# Patient Record
Sex: Male | Born: 1950 | Race: White | Hispanic: No | Smoking: Never smoker
Health system: Southern US, Community
[De-identification: ages and names within clinical notes are randomized; demographics above are authoritative.]

## PROBLEM LIST (undated history)

## (undated) DIAGNOSIS — K5080 Crohn's disease of both small and large intestine without complications: Secondary | ICD-10-CM

## (undated) DIAGNOSIS — E669 Obesity, unspecified: Secondary | ICD-10-CM

## (undated) DIAGNOSIS — I480 Paroxysmal atrial fibrillation: Secondary | ICD-10-CM

## (undated) DIAGNOSIS — K219 Gastro-esophageal reflux disease without esophagitis: Secondary | ICD-10-CM

## (undated) DIAGNOSIS — I509 Heart failure, unspecified: Secondary | ICD-10-CM

## (undated) DIAGNOSIS — G4730 Sleep apnea, unspecified: Secondary | ICD-10-CM

## (undated) DIAGNOSIS — I4892 Unspecified atrial flutter: Secondary | ICD-10-CM

## (undated) DIAGNOSIS — I2510 Atherosclerotic heart disease of native coronary artery without angina pectoris: Secondary | ICD-10-CM

## (undated) DIAGNOSIS — I5022 Chronic systolic (congestive) heart failure: Secondary | ICD-10-CM

## (undated) DIAGNOSIS — I255 Ischemic cardiomyopathy: Secondary | ICD-10-CM

## (undated) DIAGNOSIS — Z86010 Personal history of colonic polyps: Secondary | ICD-10-CM

## (undated) DIAGNOSIS — E785 Hyperlipidemia, unspecified: Secondary | ICD-10-CM

## (undated) DIAGNOSIS — I10 Essential (primary) hypertension: Secondary | ICD-10-CM

## (undated) DIAGNOSIS — E119 Type 2 diabetes mellitus without complications: Secondary | ICD-10-CM

## (undated) DIAGNOSIS — R55 Syncope and collapse: Secondary | ICD-10-CM

## (undated) HISTORY — PX: HYDROCELE EXCISION: SHX482

## (undated) HISTORY — DX: Hyperlipidemia, unspecified: E78.5

## (undated) HISTORY — DX: Crohn's disease of both small and large intestine without complications: K50.80

## (undated) HISTORY — DX: Atherosclerotic heart disease of native coronary artery without angina pectoris: I25.10

## (undated) HISTORY — DX: Type 2 diabetes mellitus without complications: E11.9

## (undated) HISTORY — DX: Personal history of colonic polyps: Z86.010

## (undated) HISTORY — DX: Heart failure, unspecified: I50.9

## (undated) HISTORY — DX: Essential (primary) hypertension: I10

## (undated) HISTORY — DX: Paroxysmal atrial fibrillation: I48.0

## (undated) HISTORY — DX: Gastro-esophageal reflux disease without esophagitis: K21.9

## (undated) HISTORY — PX: CORONARY ANGIOPLASTY WITH STENT PLACEMENT: SHX49

## (undated) HISTORY — DX: Obesity, unspecified: E66.9

## (undated) HISTORY — PX: FOOT SURGERY: SHX648

---

## 1998-09-30 HISTORY — PX: OTHER SURGICAL HISTORY: SHX169

## 1998-10-12 ENCOUNTER — Encounter: Payer: Self-pay | Admitting: Gastroenterology

## 1998-10-12 ENCOUNTER — Other Ambulatory Visit: Admission: RE | Admit: 1998-10-12 | Discharge: 1998-10-12 | Payer: Self-pay | Admitting: Gastroenterology

## 1998-10-14 ENCOUNTER — Encounter: Payer: Self-pay | Admitting: Gastroenterology

## 1998-10-14 ENCOUNTER — Ambulatory Visit (HOSPITAL_COMMUNITY): Admission: RE | Admit: 1998-10-14 | Discharge: 1998-10-14 | Payer: Self-pay | Admitting: Gastroenterology

## 1998-10-21 ENCOUNTER — Encounter: Payer: Self-pay | Admitting: Gastroenterology

## 1998-10-21 ENCOUNTER — Ambulatory Visit (HOSPITAL_COMMUNITY): Admission: RE | Admit: 1998-10-21 | Discharge: 1998-10-21 | Payer: Self-pay | Admitting: Gastroenterology

## 1998-10-23 ENCOUNTER — Encounter: Payer: Self-pay | Admitting: General Surgery

## 1998-10-26 ENCOUNTER — Inpatient Hospital Stay (HOSPITAL_COMMUNITY): Admission: RE | Admit: 1998-10-26 | Discharge: 1998-10-31 | Payer: Self-pay | Admitting: General Surgery

## 2002-12-03 ENCOUNTER — Ambulatory Visit (HOSPITAL_COMMUNITY): Admission: RE | Admit: 2002-12-03 | Discharge: 2002-12-03 | Payer: Self-pay | Admitting: Gastroenterology

## 2002-12-03 ENCOUNTER — Encounter: Payer: Self-pay | Admitting: Gastroenterology

## 2003-11-30 DIAGNOSIS — Z86010 Personal history of colonic polyps: Secondary | ICD-10-CM

## 2003-11-30 DIAGNOSIS — Z860101 Personal history of adenomatous and serrated colon polyps: Secondary | ICD-10-CM

## 2003-11-30 HISTORY — DX: Personal history of colonic polyps: Z86.010

## 2003-11-30 HISTORY — DX: Personal history of adenomatous and serrated colon polyps: Z86.0101

## 2003-12-24 ENCOUNTER — Encounter: Payer: Self-pay | Admitting: Gastroenterology

## 2004-05-31 ENCOUNTER — Ambulatory Visit: Payer: Self-pay | Admitting: Urology

## 2004-06-17 ENCOUNTER — Ambulatory Visit: Payer: Self-pay | Admitting: Gastroenterology

## 2005-08-26 ENCOUNTER — Ambulatory Visit: Payer: Self-pay | Admitting: Gastroenterology

## 2005-12-06 ENCOUNTER — Encounter: Payer: Self-pay | Admitting: Cardiology

## 2005-12-30 ENCOUNTER — Encounter: Payer: Self-pay | Admitting: Cardiology

## 2006-01-29 ENCOUNTER — Encounter: Payer: Self-pay | Admitting: Cardiology

## 2006-03-01 ENCOUNTER — Encounter: Payer: Self-pay | Admitting: Cardiology

## 2006-05-19 ENCOUNTER — Inpatient Hospital Stay (HOSPITAL_COMMUNITY): Admission: AD | Admit: 2006-05-19 | Discharge: 2006-05-20 | Payer: Self-pay | Admitting: Cardiology

## 2006-05-19 ENCOUNTER — Emergency Department: Payer: Self-pay | Admitting: General Practice

## 2006-08-23 ENCOUNTER — Other Ambulatory Visit: Payer: Self-pay

## 2006-08-23 ENCOUNTER — Inpatient Hospital Stay: Payer: Self-pay | Admitting: Internal Medicine

## 2006-08-24 ENCOUNTER — Other Ambulatory Visit: Payer: Self-pay

## 2006-09-08 ENCOUNTER — Ambulatory Visit: Payer: Self-pay | Admitting: Gastroenterology

## 2006-09-09 ENCOUNTER — Inpatient Hospital Stay (HOSPITAL_COMMUNITY): Admission: EM | Admit: 2006-09-09 | Discharge: 2006-09-12 | Payer: Self-pay | Admitting: Emergency Medicine

## 2006-09-11 ENCOUNTER — Encounter: Payer: Self-pay | Admitting: Cardiovascular Disease

## 2006-10-25 ENCOUNTER — Inpatient Hospital Stay (HOSPITAL_COMMUNITY): Admission: EM | Admit: 2006-10-25 | Discharge: 2006-10-28 | Payer: Self-pay | Admitting: Emergency Medicine

## 2006-10-26 ENCOUNTER — Encounter: Payer: Self-pay | Admitting: Cardiovascular Disease

## 2006-12-05 ENCOUNTER — Ambulatory Visit: Payer: Self-pay

## 2006-12-19 ENCOUNTER — Ambulatory Visit: Payer: Self-pay

## 2007-09-12 ENCOUNTER — Ambulatory Visit: Payer: Self-pay | Admitting: Gastroenterology

## 2008-03-25 ENCOUNTER — Encounter: Payer: Self-pay | Admitting: Cardiovascular Disease

## 2008-09-04 ENCOUNTER — Encounter: Payer: Self-pay | Admitting: Cardiovascular Disease

## 2008-09-11 ENCOUNTER — Encounter: Payer: Self-pay | Admitting: Cardiovascular Disease

## 2008-10-24 ENCOUNTER — Encounter (INDEPENDENT_AMBULATORY_CARE_PROVIDER_SITE_OTHER): Payer: Self-pay | Admitting: *Deleted

## 2008-11-11 ENCOUNTER — Ambulatory Visit: Payer: Self-pay | Admitting: Gastroenterology

## 2008-11-11 DIAGNOSIS — Z860100 Personal history of colon polyps, unspecified: Secondary | ICD-10-CM | POA: Insufficient documentation

## 2008-11-11 DIAGNOSIS — I4891 Unspecified atrial fibrillation: Secondary | ICD-10-CM

## 2008-11-11 DIAGNOSIS — Z91013 Allergy to seafood: Secondary | ICD-10-CM

## 2008-11-11 DIAGNOSIS — Z9861 Coronary angioplasty status: Secondary | ICD-10-CM

## 2008-11-11 DIAGNOSIS — I2510 Atherosclerotic heart disease of native coronary artery without angina pectoris: Secondary | ICD-10-CM

## 2008-11-11 DIAGNOSIS — K219 Gastro-esophageal reflux disease without esophagitis: Secondary | ICD-10-CM | POA: Insufficient documentation

## 2008-11-11 DIAGNOSIS — K5080 Crohn's disease of both small and large intestine without complications: Secondary | ICD-10-CM | POA: Insufficient documentation

## 2008-11-12 ENCOUNTER — Encounter: Payer: Self-pay | Admitting: Gastroenterology

## 2008-11-27 ENCOUNTER — Telehealth: Payer: Self-pay | Admitting: Gastroenterology

## 2008-12-16 ENCOUNTER — Ambulatory Visit: Payer: Self-pay | Admitting: Gastroenterology

## 2008-12-16 ENCOUNTER — Encounter: Payer: Self-pay | Admitting: Gastroenterology

## 2008-12-17 ENCOUNTER — Encounter: Payer: Self-pay | Admitting: Gastroenterology

## 2008-12-25 ENCOUNTER — Ambulatory Visit: Payer: Self-pay | Admitting: Urology

## 2009-01-05 ENCOUNTER — Ambulatory Visit: Payer: Self-pay | Admitting: Urology

## 2009-01-12 ENCOUNTER — Emergency Department: Payer: Self-pay | Admitting: Unknown Physician Specialty

## 2010-02-02 ENCOUNTER — Telehealth: Payer: Self-pay | Admitting: Gastroenterology

## 2010-02-05 ENCOUNTER — Encounter: Payer: Self-pay | Admitting: Cardiovascular Disease

## 2010-03-25 ENCOUNTER — Ambulatory Visit: Payer: Self-pay | Admitting: Gastroenterology

## 2010-04-02 ENCOUNTER — Encounter: Payer: Self-pay | Admitting: Cardiovascular Disease

## 2010-05-06 ENCOUNTER — Ambulatory Visit: Payer: Self-pay | Admitting: Cardiovascular Disease

## 2010-05-06 ENCOUNTER — Encounter: Payer: Self-pay | Admitting: Cardiovascular Disease

## 2010-05-06 DIAGNOSIS — E785 Hyperlipidemia, unspecified: Secondary | ICD-10-CM

## 2010-05-06 DIAGNOSIS — R609 Edema, unspecified: Secondary | ICD-10-CM

## 2010-05-06 LAB — CONVERTED CEMR LAB: POC INR: 2.3

## 2010-05-12 ENCOUNTER — Ambulatory Visit: Payer: Self-pay | Admitting: Cardiovascular Disease

## 2010-05-12 LAB — CONVERTED CEMR LAB: POC INR: 2.6

## 2010-05-21 ENCOUNTER — Encounter: Payer: Self-pay | Admitting: Cardiovascular Disease

## 2010-06-02 ENCOUNTER — Ambulatory Visit: Payer: Self-pay | Admitting: Cardiovascular Disease

## 2010-06-16 ENCOUNTER — Ambulatory Visit: Payer: Self-pay | Admitting: Cardiovascular Disease

## 2010-07-07 ENCOUNTER — Ambulatory Visit: Payer: Self-pay | Admitting: Cardiovascular Disease

## 2010-07-14 LAB — CONVERTED CEMR LAB: POC INR: 2.3

## 2010-08-11 ENCOUNTER — Ambulatory Visit: Admission: RE | Admit: 2010-08-11 | Discharge: 2010-08-11 | Payer: Self-pay | Source: Home / Self Care

## 2010-08-11 LAB — CONVERTED CEMR LAB: POC INR: 2.8

## 2010-08-31 NOTE — Medication Information (Signed)
Summary: Colton Lamb  Anticoagulant Therapy  Managed by: Tula Nakayama, RN, BSN Referring MD: Dr Rockey Situ PCP: Aniceto Boss, MD Supervising MD: Esmond Plants Indication 1: Atrial Fibrillation Lab Used: [facility] [place] Site: [place] INR POC 2.3 INR RANGE 2.0-3.0  Dietary changes: no    Health status changes: no    Bleeding/hemorrhagic complications: no    Recent/future hospitalizations: no    Any changes in medication regimen? no    Recent/future dental: no  Any missed doses?: no       Is patient compliant with meds? yes       Allergies: 1)  ! Tetracycline 2)  ! Iodine 3)  * Shellfish  Anticoagulation Management History:      The patient is taking warfarin and comes in today for a routine follow up visit.  Negative risk factors for bleeding include an age less than 23 years old.  The bleeding index is 'low risk'.  Positive CHADS2 values include History of HTN.  Negative CHADS2 values include Age > 75 years old.  Anticoagulation responsible provider: Esmond Plants.  INR POC: 2.3.  Cuvette Lot#: [id number].  Exp: 07/2011.    Anticoagulation Management Assessment/Plan:      The patient's current anticoagulation dose is Warfarin sodium 1 mg tabs: 1/2 once daily, Warfarin sodium 4 mg tabs: take as directed.  The target INR is 2.0-3.0.  The next INR is due 07/07/2010.  Anticoagulation instructions were given to patient.  Results were reviewed/authorized by Tula Nakayama, RN, BSN.  He was notified by Tula Nakayama, RN, BSN.         Prior Anticoagulation Instructions: INR 4.0  Skip today's dosage then start taking 1.5 tablets daily except 1 tablet on Mondays, Wednesdays, and Fridays.  Recheck in 2 weeks.     Current Anticoagulation Instructions: INR 2.3 Continue 4.52m daily except 338m on Mondays, Wednesdays and Fridays. Recheck in 3 weeks.

## 2010-08-31 NOTE — Letter (Signed)
Summary: [place]   Imported By: Marilynne Drivers 06/04/2010 13:56:45  _____________________________________________________________________  External Attachment:    Type:   Image     Comment:   External Document

## 2010-08-31 NOTE — Assessment & Plan Note (Signed)
Summary: NP6/AMD  Medications Added DILTIAZEM HCL ER BEADS 120 MG XR24H-CAP (DILTIAZEM HCL ER BEADS) Take one capsule by mouth daily FUROSEMIDE 20 MG TABS (FUROSEMIDE) Take 1 tablet by mouth once a day WARFARIN SODIUM 1 MG TABS (WARFARIN SODIUM) 1/2 once daily WARFARIN SODIUM 4 MG TABS (WARFARIN SODIUM) take as directed ZETIA 10 MG TABS (EZETIMIBE) Take one tablet by mouth daily.      Allergies Added:   Visit Type:  Initial Consult Primary Provider:  Aniceto Boss, MD  CC:  Establish care.  Former [place] patient.  Denies chest pain or shortness of breath..  History of Present Illness: [name] is a 60 year old gentleman with a history of paroxysmal atrial fibrillation,  hyperlipidemia, coronary artery disease, PCI x3 to the LAD, circumflex, PDA and PL branch, ejection fraction 45-50% with anterior hypokinesis, last catheterization in 2008 with admission for unstable angina found to have in-stent restenosis in the RCA and had treatment with a cypher stent to the OM who presents to establish care.  He has had chronic lower extremity edema which is worse on the right than the left.he does have significant p.o. fluid intake. He has been on diltiazem for many years and is uncertain if his edema started when the diltiazem was started. He denies any chest pain, shortness of breath.  cardiac catheterization February 2008 showed patent LAD stent proximally, large codominant circumflex with patent stent, RCA with 40-50% stenosis in the distal right, 80% instant restenosis in the mid PDA in the setting of a prior cutting balloon atherectomy. A 2.5 x 13 mm Cypher stent was placed.  EKG shows normal sinus rhythm with rate 63 beats per minute, poor R-wave progression, unable to rule out anterior infarct, left axis deviation  Last stress test February 2010 showing moderate to severe perfusion defect in the mid anteroseptal, apical anterior, apical septal, apical and septal regions consistent with  infarct or scar. Also in the inferior wall. No significant ischemia. Ejection fraction 54%.   Labwork from September 2007 shows total cholesterol 180, HDL 39, LDL 108  Echocardiogram March 2008 shows ejection fraction 45-55%, mild to moderately dilated, mid to distal anterior septal wall with severe hypokinesis, mild LVH  Current Medications (verified): 1)  Plavix 75 Mg Tabs (Clopidogrel Bisulfate) .Marland Kitchen.. 1 Tablet By Mouth Once Daily 2)  Lisinopril 20 Mg Tabs (Lisinopril) .Marland Kitchen.. 1 By Mouth Once Daily 3)  Omeprazole 20 Mg Tbec (Omeprazole) .Marland Kitchen.. 1 Tablet By Mouth Once Daily 4)  Coreg Cr 80 Mg Xr24h-Cap (Carvedilol Phosphate) .Marland Kitchen.. 1 Tablet By Mouth Once Daily 5)  Isosorbide Mononitrate Cr 30 Mg Xr24h-Tab (Isosorbide Mononitrate) .... Take One Tablet By Mouth Daily 6)  Aspirin 81 Mg Tbec (Aspirin) .Marland Kitchen.. 1 Tablet By Mouth Once Daily 7)  Dilt-Xr 180 Mg Xr24h-Cap (Diltiazem Hcl) .Marland Kitchen.. 1 Tablet By Mouth Once Daily 8)  Lipitor 80 Mg Tabs (Atorvastatin Calcium) .Marland Kitchen.. 1 Tablet By Mouth Once Daily 9)  Niaspan 1000 Mg Cr-Tabs (Niacin (Antihyperlipidemic)) .Marland Kitchen.. 1 By Mouth At Bedtime 10)  Nitro-Dur 0.4 Mg/hr Pt24 (Nitroglycerin) .... As Needed 11)  Asacol Hd 800 Mg Tbec (Mesalamine) .... One Tablet By Mouth Three Times A Day 12)  Furosemide 20 Mg Tabs (Furosemide) .... 1/2 Tablet By Mouth Once Daily 13)  Warfarin Sodium 1 Mg Tabs (Warfarin Sodium) .... 1/2 Once Daily 14)  Warfarin Sodium 4 Mg Tabs (Warfarin Sodium) .... Take As Directed  Allergies (verified): 1)  ! Tetracycline 2)  ! 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  Alcohol Use - no Illicit Drug Use - no Patient does not get regular exercise.   Risk Factors: Exercise: no (11/11/2008)  Risk Factors: Smoking Status: never (11/11/2008)  Vital Signs:  Patient profile:   60 year old male Height:      71 inches Weight:      276 pounds BMI:     38.63 Pulse rate:   76 / minute BP sitting:   107 / 66  (left arm) Cuff size:   regular  Vitals Entered By: Dolores Lory, CMA (May 06, 2010 2:29 PM)  Physical Exam  General:  Well developed, well nourished, in no acute distress. Head:  normocephalic and atraumatic Neck:  Neck supple, no JVD. No masses, thyromegaly or abnormal cervical nodes. Lungs:  Clear bilaterally to auscultation and percussion. Heart:  Non-displaced PMI, chest non-tender; regular rate and rhythm, S1, S2 with murmur II/VI SEM RSB, no rubs or gallops. Carotid upstroke normal, no bruit. . Pedals normal pulses. 1+ b/l LE  edema, no varicosities. Abdomen:  Bowel sounds positive; abdomen soft and non-tender without masses Msk:  Back normal, normal gait. Muscle strength and tone normal. Pulses:  pulses normal in all 4 extremities Extremities:  No clubbing or cyanosis. Neurologic:  Alert and oriented x 3. Skin:  Intact without lesions or rashes. Psych:  Normal affect.   Impression & Recommendations:  Problem # 1:  CORONARY ARTERY DISEASE (ICD-414.00) appears stable at this time, stress test last year showing no ischemia, large region of scar. No further testing at this time  The following medications were removed from the medication list:    Warfarin Sodium 3 Mg Tabs (Warfarin sodium) .Marland Kitchen... 1 1/2 tablets by mouth once daily His updated medication list for  this problem includes:    Plavix 75 Mg Tabs (Clopidogrel bisulfate) .Marland Kitchen... 1 tablet by mouth once daily    Lisinopril 20 Mg Tabs (Lisinopril) .Marland Kitchen... 1 by mouth once daily    Coreg Cr 80 Mg Xr24h-cap (Carvedilol phosphate) .Marland Kitchen... 1 tablet by mouth once daily    Isosorbide Mononitrate Cr 30 Mg Xr24h-tab (Isosorbide mononitrate) .Marland Kitchen... Take one tablet by mouth daily    Aspirin 81 Mg Tbec (Aspirin) .Marland Kitchen... 1 tablet by mouth once daily    Diltiazem Hcl Er Beads 120 Mg Xr24h-cap (Diltiazem hcl er beads) .Marland Kitchen... Take one capsule by mouth daily    Nitro-dur 0.4 Mg/hr Pt24 (Nitroglycerin) .Marland Kitchen... As needed    Warfarin Sodium 1 Mg Tabs (Warfarin sodium) .Marland Kitchen... 1/2 once daily    Warfarin Sodium 4 Mg Tabs (Warfarin sodium) .Marland Kitchen... Take as directed  Problem # 2:  ATRIAL FIBRILLATION (ICD-427.31) Maintaining normal sinus rhythm. Continue carvedilol.  The following medications were removed from the medication list:    Warfarin Sodium 3 Mg Tabs (Warfarin sodium) .Marland Kitchen... 1 1/2 tablets by mouth once daily His updated medication list for this problem includes:    Plavix 75 Mg Tabs (Clopidogrel bisulfate) .Marland Kitchen... 1 tablet by mouth once daily    Coreg  Cr 80 Mg Xr24h-cap (Carvedilol phosphate) .Marland Kitchen... 1 tablet by mouth once daily    Aspirin 81 Mg Tbec (Aspirin) .Marland Kitchen... 1 tablet by mouth once daily    Warfarin Sodium 1 Mg Tabs (Warfarin sodium) .Marland Kitchen... 1/2 once daily    Warfarin Sodium 4 Mg Tabs (Warfarin sodium) .Marland Kitchen... Take as directed  Problem # 3:  EDEMA (ICD-782.3) For his edema, we will increase his Lasix to 20 mg daily, have encouraged him to decrease his fluid intake. We will also decrease his diltiazem from 180 mg to 120 mg as calcium channel blockers can be associated with venous insufficiency.  Problem # 4:  HYPERLIPIDEMIA-MIXED (RSW-546.4) We will add zetia 10 mg as his LDL is not at goal.  His updated medication list for this problem includes:    Lipitor 80 Mg Tabs (Atorvastatin calcium) .Marland Kitchen... 1 tablet by mouth once  daily    Niaspan 1000 Mg Cr-tabs (Niacin (antihyperlipidemic)) .Marland Kitchen... 1 by mouth at bedtime    Zetia 10 Mg Tabs (Ezetimibe) .Marland Kitchen... Take one tablet by mouth daily.  Patient Instructions: 1)  Your physician has recommended you make the following change in your medication: DECREASE diltiazem 143m. INCREASE lasix take 1 tab daily. START zetia  2)  Your physician wants you to follow-up in:  6 months  You will receive a reminder letter in the mail two months in advance. If you don't receive a letter, please call our office to schedule the follow-up appointment. 3)  Decrease fluid intake  Prescriptions: FUROSEMIDE 20 MG TABS (FUROSEMIDE) Take 1 tablet by mouth once a day  #30 x 6   Entered by:   ACordelia Pen RN   Authorized by:   TEsmond PlantsMD   Signed by:   ACordelia Pen RN on 05/06/2010   Method used:   Electronically to        [facility]  [address] [facility][phone] (retail)       [address]  [address]      Ph: [phone] [phone]      Fax: [phone]  RxID:   [id number] HCL ER BEADS 120 MG XR24H-CAP (DILTIAZEM HCL ER BEADS) Take one capsule by mouth daily  #30 x 6   Entered by:   ACordelia Pen RN   Authorized by:   TEsmond PlantsMD   Signed by:   ACordelia Pen RN on 05/06/2010   Method used:   [facility]        [facility]  [address] [facility][phone] (retail)       [address]      [place]  [address]      Ph: [phone] [phone]      Fax: [phone]  RxID::   [id number] 10 MG TABS (EZETIMIBE) Take one tablet by mouth daily.  #30 x 6   Entered by:   ACordelia Pen RN   Authorized by:   TEsmond PlantsMD   Signed by:   ACordelia Pen RN on 05/06/2010   Method used:   [facility]ically to        [facility][phone] (retail)       [address]      [place]  [address]  Ph: [phone] or [phone]       Fax: [phone]   RxID:   [id number]

## 2010-08-31 NOTE — Medication Information (Signed)
Summary: rov/ewj  Anticoagulant Therapy  Managed by: Freddrick March, RN, BSN Referring MD: Dr Rockey Situ PCP: Aniceto Boss, MD Supervising MD: Esmond Plants Indication 1: Atrial Fibrillation Lab Used: [facility]  Site: [place] INR POC 4.0 INR RANGE 2.0-3.0  Dietary changes: no    Health status changes: no    Bleeding/hemorrhagic complications: no    Recent/future hospitalizations: no    Any changes in medication regimen? no    Recent/future dental: no  Any missed doses?: no       Is patient compliant with meds? yes       Allergies: 1)  ! Tetracycline 2)  ! Iodine 3)  * Shellfish  Anticoagulation Management History:      The patient is taking warfarin and comes in today for a routine follow up visit.  Negative risk factors for bleeding include an age less than 59 years old.  The bleeding index is 'low risk'.  Positive CHADS2 values include History of HTN.  Negative CHADS2 values include Age > 63 years old.  Anticoagulation responsible Jguadalupe Opiela: Esmond Plants.  INR POC: 4.0.  Cuvette Lot#: [id number].  Exp: 05/2011.    Anticoagulation Management Assessment/Plan:      The patient's current anticoagulation dose is Warfarin sodium 1 mg tabs: 1/2 once daily, Warfarin sodium 4 mg tabs: take as directed.  The target INR is 2.0-3.0.  The next INR is due 06/16/2010.  Results were reviewed/authorized by Freddrick March, RN, BSN.  He was notified by Freddrick March RN.         Prior Anticoagulation Instructions: INR 2.6  Continue on same dosage 1.5 tablets daily except 1 tablet on Mondays and Fridays.  Recheck in 3 weeks.  Current Anticoagulation Instructions: INR 4.0  Skip today's dosage then start taking 1.5 tablets daily except 1 tablet on Mondays, Wednesdays, and Fridays.  Recheck in 2 weeks.

## 2010-08-31 NOTE — Assessment & Plan Note (Signed)
Summary: med refill--ch.   History of Present Illness Visit Type: follow up Primary GI MD: Joylene Igo MD [facility] Primary Zerek Litsey: Aniceto Boss, MD Chief Complaint: Crohn's f/u and medication refills. Pt is doing very well on his medicine dose.  History of Present Illness:   Colton Lamb returns for followup of Crohn's ileocolitis. He has no ongoing colorectal complaints. He last underwent colonoscopy in May 2010. His reflux symptoms remain under very good control on omeprazole 20 mg daily.   GI Review of Systems      Denies abdominal pain, acid reflux, belching, bloating, chest pain, dysphagia with liquids, dysphagia with solids, heartburn, loss of appetite, nausea, vomiting, vomiting blood, weight loss, and  weight gain.        Denies anal fissure, black tarry stools, change in bowel habit, constipation, diarrhea, diverticulosis, fecal incontinence, heme positive stool, hemorrhoids, irritable bowel syndrome, jaundice, light color stool, liver problems, rectal bleeding, and  rectal pain.   Current Medications (verified): 1)  Plavix 75 Mg Tabs (Clopidogrel Bisulfate) .Marland Kitchen.. 1 Tablet By Mouth Once Daily 2)  Lisinopril 20 Mg Tabs (Lisinopril) .Marland Kitchen.. 1 By Mouth Once Daily 3)  Omeprazole 20 Mg Tbec (Omeprazole) .Marland Kitchen.. 1 Tablet By Mouth Once Daily 4)  Coreg Cr 80 Mg Xr24h-Cap (Carvedilol Phosphate) .Marland Kitchen.. 1 Tablet By Mouth Once Daily 5)  Isosorbide Mononitrate Cr 30 Mg Xr24h-Tab (Isosorbide Mononitrate) .... Take One Tablet By Mouth Daily 6)  Aspirin 81 Mg Tbec (Aspirin) .Marland Kitchen.. 1 Tablet By Mouth Once Daily 7)  Dilt-Xr 180 Mg Xr24h-Cap (Diltiazem Hcl) .Marland Kitchen.. 1 Tablet By Mouth Once Daily 8)  Warfarin Sodium 3 Mg Tabs (Warfarin Sodium) .Marland Kitchen.. 1 1/2 Tablets By Mouth Once Daily 9)  Lipitor 80 Mg Tabs (Atorvastatin Calcium) .Marland Kitchen.. 1 Tablet By Mouth Once Daily 10)  Niaspan 1000 Mg Cr-Tabs (Niacin (Antihyperlipidemic)) .Marland Kitchen.. 1 By Mouth At Bedtime 11)  Nitro-Dur 0.4 Mg/hr Pt24 (Nitroglycerin) .... As Needed 12)   Asacol Hd 800 Mg Tbec (Mesalamine) .... One Tablet By Mouth Three Times A Day 13)  Furosemide 20 Mg Tabs (Furosemide) .... 1/2 Tablet By Mouth Once Daily  Allergies (verified): 1)  ! Tetracycline 2)  ! 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 Ileocecal resection and sigmoid enterocolonic fistula repair 09/1998  Drug-eluting artery stent 08/2006 Left testicle hydrocele removed Foot surgery  Family History: Reviewed history from 11/11/2008 and no changes required. No FH of Colon Cancer: Family History of Breast Cancer: mother Family History of Heart Disease: father  Social History: Reviewed history from 11/11/2008 and no changes required. Occupation: retired Patient has never smoked.  Alcohol Use - no Illicit Drug Use - no Patient does not get regular exercise.   Review of Systems  The patient denies allergy/sinus, anemia, anxiety-new, arthritis/joint pain, back pain, blood in urine, breast changes/lumps, change in vision, confusion, cough, coughing up blood, depression-new, fainting, fatigue, fever, headaches-new, hearing problems, heart murmur, heart rhythm changes, itching, menstrual pain, muscle pains/cramps, night sweats, nosebleeds, pregnancy symptoms, shortness of breath, skin rash, sleeping problems, sore throat, swelling of feet/legs, swollen lymph glands, thirst - excessive , urination - excessive , urination changes/pain, urine leakage, vision changes, and voice change.    Vital Signs:  Patient profile:   60 year old male Height:      71 inches  Weight:      283.8 pounds BMI:     39.73 Pulse rate:   70 / minute Pulse rhythm:   regular BP sitting:   116 / 74  (right arm) Cuff size:    regular  Vitals Entered By: Colton Lamb CMA Deborra Medina) (March 25, 2010 9:51 AM)  Physical Exam  General:  Well developed, well nourished, no acute distress. obese.   Head:  Normocephalic and atraumatic. Eyes:  PERRLA, no icterus. Mouth:  No deformity or lesions, dentition normal. Lungs:  Clear throughout to auscultation. Heart:  Regular rate and rhythm; no murmurs, rubs,  or bruits. Abdomen:  Soft, nontender and nondistended. No masses, hepatosplenomegaly or hernias noted. Normal bowel sounds. Psych:  Alert and cooperative. Normal mood and affect.  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  Problem # 2:  GERD (ICD-530.81) Continue standard antireflux measures and omeprazole 20 mg q. day.  Problem # 3:  COLONIC POLYPS, HX OF (ICD-V12.72) Prior history of adenomatous colon polyps. Surveillance colonoscopy recommended. May 2015.  Patient Instructions: 1)  Pick up your prescription from your pharmacy.  2)  Please continue current medications.  3)  Please schedule a follow-up appointment in 1 year. 4)  Copy sent to : Aniceto Boss, MD 5)  The medication list was reviewed and reconciled.  All changed / newly prescribed medications were explained.  A complete medication list was provided to the patient / caregiver.  Prescriptions: ASACOL HD 800 MG TBEC (MESALAMINE) one tablet by mouth three times a day  #90 x 11   Entered by:   Colton Lamb CMA ([place])   Authorized by:   Ladene Artist MD [facility]   Signed by:   Ladene Artist MD [facility] on 03/25/2010   Method used:   [facility]        [id number]       [address]       [place], [place]  [address]       Ph: [phone] or [phone]       Fax: [phone]   RxID:   [id number]

## 2010-08-31 NOTE — Medication Information (Signed)
Summary: [facility]   Imported By: Zenovia Jarred 05/21/2010 11:28:02  _____________________________________________________________________  External Attachment:    Type:   Image     Comment:   External Document

## 2010-08-31 NOTE — Medication Information (Signed)
Summary: CCR/AMD  Anticoagulant Therapy  Managed by: Freddrick March, RN, BSN PCP: Aniceto Boss, MD Supervising MD: Esmond Plants Indication 1: Atrial Fibrillation Lab Used: [facility] [place] Site: [place] INR POC 2.6 INR RANGE 2.0-3.0  Dietary changes: no    Health status changes: no    Bleeding/hemorrhagic complications: no     Any changes in medication regimen? yes       Details: added Zetia  Recent/future dental: no  Any missed doses?: no       Is patient compliant with meds? yes      Comments: Pt has been on Coumadin since 2008, previously monitored at [facility].   Allergies: 1)  ! Tetracycline 2)  ! Iodine 3)  * Shellfish  Anticoagulation Management History:      The patient is taking warfarin and comes in today for a routine follow up visit.  Negative risk factors for bleeding include an age less than 11 years old.  The bleeding index is 'low risk'.  Positive CHADS2 values include History of HTN.  Negative CHADS2 values include Age > 57 years old.  Anticoagulation responsible provider: Esmond Plants.  INR POC: 2.6.  Cuvette Lot#: [id number].  Exp: 06/2011.    Anticoagulation Management Assessment/Plan:      The patient's current anticoagulation dose is Warfarin sodium 1 mg tabs: 1/2 once daily, Warfarin sodium 4 mg tabs: take as directed.  The target INR is 2.0-3.0.  The next INR is due 06/02/2010.  Results were reviewed/authorized by Freddrick March, RN, BSN.  He was notified by Freddrick March RN.         Prior Anticoagulation Instructions: INR 2.3   Continue taking 1.5 tabs daily except for 1 tab on Monday and Friday. Recheck in 1 week.   Current Anticoagulation Instructions: INR 2.6  Continue on same dosage 1.5 tablets daily except 1 tablet on Mondays and Fridays.  Recheck in 3 weeks.

## 2010-08-31 NOTE — Medication Information (Signed)
Summary: [facility]   Anticoagulant Therapy  Managed by: Freddrick March, RN, BSN PCP: Aniceto Boss, MD Supervising MD: Esmond Plants Indication 1: Atrial Fibrillation [place] Site:  INR POC 2.3  Dietary changes: no     Bleeding/hemorrhagic complications: no     Any changes in medication regimen? no     Any missed doses?: no       Is patient compliant with meds? yes       Allergies: 1)  ! Tetracycline 2)  ! Iodine 3)  * Shellfish  Anticoagulation Management History:      The patient is taking warfarin and comes in today for a routine follow up visit.  Negative risk factors for bleeding include an age less than 73 years old.  The bleeding index is 'low risk'.  Positive CHADS2 values include History of HTN.  Negative CHADS2 values include Age > 27 years old.  Anticoagulation responsible provider: Esmond Plants.  INR POC: 2.3.  Cuvette Lot#: [id number].  Exp: 06/2011.    Anticoagulation Management Assessment/Plan:      The patient's current anticoagulation dose is Warfarin sodium 3 mg tabs: 1 1/2 tablets by mouth once daily.  The next INR is due 05/12/2010.  Results were reviewed/authorized by Freddrick March, RN, BSN.  He was notified by Cordelia Pen, RN.         Current Anticoagulation Instructions: INR 2.3   Continue taking 1.5 tabs daily except for 1 tab on Monday and Friday. Recheck in 1 week.

## 2010-08-31 NOTE — Cardiovascular Report (Signed)
Summary: [facility]  [facility]   Imported By: Marilynne Drivers 06/04/2010 13:48:37  _____________________________________________________________________  External Attachment:    Type:   Image     Comment:   External Document

## 2010-08-31 NOTE — Progress Notes (Signed)
Summary: Asacol refill   Phone Note Call from Patient   Caller: Patient Call For: Dr. Fuller Plan Reason for Call: Refill Medication Summary of Call: pt. needs a refill on his Asacol. He sch'd an appt. for 03-25-10 and needs enough to last him until his appt. Initial call taken by: Webb Laws,  February 02, 2010 8:40 AM  Follow-up for Phone Call        Rx was sent to pts pharmacy and pt notified to keep his appt on 8-25 for any further refills. Pt states he will keep his appt but he is our of Asacol.  Follow-up by: Marlon Pel CMA ([place]),  February 02, 2010 9:05 AM    Prescriptions: ASACOL HD 800 MG TBEC (MESALAMINE) one tablet by mouth three times a day  #90 x 1   Entered by:   Marlon Pel CMA ([place])   Authorized by:   Ladene Artist MD [facility]   Signed by:   Marlon Pel CMA ([place]) on 02/02/2010   Method used:   Electronically to        [id number] (retail)       [address]       [place], [place]  [address]       Ph: [phone] or [phone]       Fax: [phone]   RxID:   [id number]

## 2010-09-02 NOTE — Medication Information (Signed)
Summary: rov/ewj  Anticoagulant Therapy  Managed by: Freddrick March, RN, BSN Referring MD: Dr Rockey Situ PCP: Aniceto Boss, MD Supervising MD: Esmond Plants Indication 1: Atrial Fibrillation Lab Used: [facility] [place] Site: [place] INR POC 2.8 INR RANGE 2.0-3.0  Dietary changes: no    Health status changes: no    Bleeding/hemorrhagic complications: no    Recent/future hospitalizations: no    Any changes in medication regimen? no    Recent/future dental: no  Any missed doses?: no       Is patient compliant with meds? yes       Allergies: 1)  ! Tetracycline 2)  ! Iodine 3)  * Shellfish  Anticoagulation Management History:      The patient is taking warfarin and comes in today for a routine follow up visit.  Negative risk factors for bleeding include an age less than 44 years old.  The bleeding index is 'low risk'.  Positive CHADS2 values include History of HTN.  Negative CHADS2 values include Age > 33 years old.  Anticoagulation responsible provider: Esmond Plants.  INR POC: 2.8.  Cuvette Lot#: [id number].  Exp: 09/2011.    Anticoagulation Management Assessment/Plan:      The patient's current anticoagulation dose is Warfarin sodium 1 mg tabs: 1/2 once daily, Warfarin sodium 4 mg tabs: take as directed.  The target INR is 2.0-3.0.  The next INR is due 09/15/2010.  Anticoagulation instructions were given to patient.  Results were reviewed/authorized by Freddrick March, RN, BSN.  He was notified by Freddrick March RN.         Prior Anticoagulation Instructions: INR 2.3  Continue on same dosage 1.5 tablets daily except 1 tablet on Mondays, Wednesdays, and Fridays.  Recheck in 4 weeks.    Current Anticoagulation Instructions: INR 2.8  Continue on same dosage 1.5 tablets daily except 1 tablet on Mondays,. Wednesdays, and Fridays.  Recheck in 4 weeks.

## 2010-09-02 NOTE — Medication Information (Signed)
Summary: rov/tm  Anticoagulant Therapy  Managed by: Freddrick March, RN, BSN Referring MD: Dr Rockey Situ PCP: Aniceto Boss, MD Supervising MD: Esmond Plants Indication 1: Atrial Fibrillation Lab Used: [facility] [place] Site: [place] INR POC 2.3 INR RANGE 2.0-3.0  Dietary changes: no    Health status changes: no    Bleeding/hemorrhagic complications: no    Recent/future hospitalizations: no    Any changes in medication regimen? no    Recent/future dental: no  Any missed doses?: no       Is patient compliant with meds? yes       Allergies: 1)  ! Tetracycline 2)  ! Iodine 3)  * Shellfish  Anticoagulation Management History:      The patient is taking warfarin and comes in today for a routine follow up visit.  Negative risk factors for bleeding include an age less than 62 years old.  The bleeding index is 'low risk'.  Positive CHADS2 values include History of HTN.  Negative CHADS2 values include Age > 54 years old.  Anticoagulation responsible provider: Esmond Plants.  INR POC: 2.3.  Cuvette Lot#: [id number].  Exp: 07/2011.    Anticoagulation Management Assessment/Plan:      The patient's current anticoagulation dose is Warfarin sodium 1 mg tabs: 1/2 once daily, Warfarin sodium 4 mg tabs: take as directed.  The target INR is 2.0-3.0.  The next INR is due 08/11/2010.  Anticoagulation instructions were given to patient.  Results were reviewed/authorized by Freddrick March, RN, BSN.  He was notified by Freddrick March RN.         Prior Anticoagulation Instructions: INR 2.3 Continue 4.99m daily except 366m on Mondays, Wednesdays and Fridays. Recheck in 3 weeks.   Current Anticoagulation Instructions: INR 2.3  Continue on same dosage 1.5 tablets daily except 1 tablet on Mondays, Wednesdays, and Fridays.  Recheck in 4 weeks.

## 2010-09-08 NOTE — Letter (Signed)
Summary: Colton Lamb   Imported By: Marilynne Drivers 09/01/2010 10:35:57  _____________________________________________________________________  External Attachment:    Type:   Image     Comment:   External Document

## 2010-09-08 NOTE — Letter (Signed)
Summary: [place]   Imported By: Marilynne Drivers 09/01/2010 10:32:19  _____________________________________________________________________  External Attachment:    Type:   Image     Comment:   External Document

## 2010-09-15 ENCOUNTER — Encounter (INDEPENDENT_AMBULATORY_CARE_PROVIDER_SITE_OTHER): Payer: BC Managed Care – PPO

## 2010-09-15 ENCOUNTER — Encounter: Payer: Self-pay | Admitting: Cardiovascular Disease

## 2010-09-15 DIAGNOSIS — Z7901 Long term (current) use of anticoagulants: Secondary | ICD-10-CM

## 2010-09-15 DIAGNOSIS — I4891 Unspecified atrial fibrillation: Secondary | ICD-10-CM

## 2010-09-22 NOTE — Medication Information (Signed)
Summary: ROV/EWJ/AMD  Anticoagulant Therapy  Managed by: Tula Nakayama, RN, BSN Referring MD: Dr Rockey Situ PCP: Aniceto Boss, MD Supervising MD: Esmond Plants Indication 1: Atrial Fibrillation Lab Used: [facility] [place] Site: [place] INR POC 2.7 INR RANGE 2.0-3.0  Dietary changes: no    Health status changes: no    Bleeding/hemorrhagic complications: no    Recent/future hospitalizations: no    Any changes in medication regimen? no    Recent/future dental: no  Any missed doses?: no       Is patient compliant with meds? yes       Allergies: 1)  ! Tetracycline 2)  ! Iodine 3)  * Shellfish  Anticoagulation Management History:      The patient is taking warfarin and comes in today for a routine follow up visit.  Negative risk factors for bleeding include an age less than 67 years old.  The bleeding index is 'low risk'.  Positive CHADS2 values include History of HTN.  Negative CHADS2 values include Age > 73 years old.  Anticoagulation responsible provider: Esmond Plants.  INR POC: 2.7.  Cuvette Lot#: [id number].  Exp: 08/2011.    Anticoagulation Management Assessment/Plan:      The patient's current anticoagulation dose is Warfarin sodium 1 mg tabs: 1/2 once daily, Warfarin sodium 4 mg tabs: take as directed.  The target INR is 2.0-3.0.  The next INR is due 10/13/2010.  Anticoagulation instructions were given to patient.  Results were reviewed/authorized by Tula Nakayama, RN, BSN.  He was notified by Tula Nakayama, RN, BSN.         Prior Anticoagulation Instructions: INR 2.8  Continue on same dosage 1.5 tablets daily except 1 tablet on Mondays,. Wednesdays, and Fridays.  Recheck in 4 weeks.    Current Anticoagulation Instructions: INR 2.7 Continue 1.5 pills everyday except 1 pill on Mondays, Wednesdays and Fridays. Recheck in 4 weeks.

## 2010-10-10 ENCOUNTER — Encounter: Payer: Self-pay | Admitting: Cardiovascular Disease

## 2010-10-13 ENCOUNTER — Encounter: Payer: Self-pay | Admitting: Cardiology

## 2010-10-13 ENCOUNTER — Encounter (INDEPENDENT_AMBULATORY_CARE_PROVIDER_SITE_OTHER): Payer: BC Managed Care – PPO

## 2010-10-13 DIAGNOSIS — Z7901 Long term (current) use of anticoagulants: Secondary | ICD-10-CM

## 2010-10-13 DIAGNOSIS — I4891 Unspecified atrial fibrillation: Secondary | ICD-10-CM

## 2010-10-13 LAB — CONVERTED CEMR LAB: POC INR: 2.8

## 2010-10-19 ENCOUNTER — Other Ambulatory Visit: Payer: Self-pay | Admitting: Cardiovascular Disease

## 2010-10-19 ENCOUNTER — Encounter: Payer: Self-pay | Admitting: Cardiovascular Disease

## 2010-10-19 DIAGNOSIS — Z7901 Long term (current) use of anticoagulants: Secondary | ICD-10-CM | POA: Insufficient documentation

## 2010-10-19 DIAGNOSIS — I4891 Unspecified atrial fibrillation: Secondary | ICD-10-CM

## 2010-10-19 MED ORDER — WARFARIN SODIUM 3 MG PO TABS: 3.0000 mg | ORAL_TABLET | ORAL | Status: DC

## 2010-10-19 NOTE — Telephone Encounter (Signed)
Refill sent to pharmacy.   

## 2010-10-19 NOTE — Medication Information (Signed)
Summary: rov/tm  Anticoagulant Therapy  Managed by: Freddrick March, RN, BSN Referring MD: Dr Rockey Situ PCP: Aniceto Boss, MD Supervising MD: Aundra Dubin MD, Tylan Kinn Indication 1: Atrial Fibrillation Lab Used: [facility] [place] Site: [place] INR POC 2.8 INR RANGE 2.0-3.0  Dietary changes: no    Health status changes: no    Bleeding/hemorrhagic complications: no    Recent/future hospitalizations: no    Any changes in medication regimen? no    Recent/future dental: no  Any missed doses?: no       Is patient compliant with meds? yes       Allergies: 1)  ! Tetracycline 2)  ! Iodine 3)  * Shellfish  Anticoagulation Management History:      The patient is taking warfarin and comes in today for a routine follow up visit.  Negative risk factors for bleeding include an age less than 71 years old.  The bleeding index is 'low risk'.  Positive CHADS2 values include History of HTN.  Negative CHADS2 values include Age > 92 years old.  Anticoagulation responsible provider: Aundra Dubin MD, Deleah Tison.  INR POC: 2.8.  Cuvette Lot#: [id number].  Exp: 08/2011.    Anticoagulation Management Assessment/Plan:      The patient's current anticoagulation dose is Warfarin sodium 1 mg tabs: 1/2 once daily, Warfarin sodium 4 mg tabs: take as directed.  The target INR is 2.0-3.0.  The next INR is due 11/10/2010.  Anticoagulation instructions were given to patient.  Results were reviewed/authorized by Freddrick March, RN, BSN.  He was notified by Freddrick March RN.         Prior Anticoagulation Instructions: INR 2.7 Continue 1.5 pills everyday except 1 pill on Mondays, Wednesdays and Fridays. Recheck in 4 weeks.   Current Anticoagulation Instructions: INR 2.8  Continue on same dosage 1.5 tablets daily except 1 tablet on Mondays, Wednesdays, and Fridays.  Recheck in 4 weeks.

## 2010-11-10 ENCOUNTER — Ambulatory Visit (INDEPENDENT_AMBULATORY_CARE_PROVIDER_SITE_OTHER): Payer: BC Managed Care – PPO | Admitting: Emergency Medicine

## 2010-11-10 DIAGNOSIS — I4891 Unspecified atrial fibrillation: Secondary | ICD-10-CM

## 2010-11-10 DIAGNOSIS — Z7901 Long term (current) use of anticoagulants: Secondary | ICD-10-CM

## 2010-12-08 ENCOUNTER — Ambulatory Visit (INDEPENDENT_AMBULATORY_CARE_PROVIDER_SITE_OTHER): Payer: BC Managed Care – PPO | Admitting: Emergency Medicine

## 2010-12-08 DIAGNOSIS — Z7901 Long term (current) use of anticoagulants: Secondary | ICD-10-CM

## 2010-12-08 DIAGNOSIS — I4891 Unspecified atrial fibrillation: Secondary | ICD-10-CM

## 2010-12-14 NOTE — Assessment & Plan Note (Signed)
[facility] OFFICE NOTE   ICHAEL, Lamb                     MRN:          [id number]  DATE:09/12/2007                            DOB:          [birth year]    This is a return office visit for followup of Crohn's disease and GERD.  He is status post ileocecal resection.  He remains asymptomatic from a  gastrointestinal standpoint.  The patient relates that due to recurrent  problems with coronary artery stent occlusion, he was placed on Coumadin  in addition to Plavix by Dr. Tami Ribas.  He has a history of colon polyps  and we had postponed his followup colonoscopy until May of this year.  Given the need for Coumadin and Plavix, we will postpone his recall  colonoscopy to the maximum interval of five years in May of 2010.   CURRENT MEDICATIONS:  Listed on the chart, updated and reviewed.   MEDICATION ALLERGIES:  TETRACYCLINE, SHELLFISH IODINE.   PHYSICAL EXAMINATION:  Obese, white male in no acute distress.  Weight  271.8 pounds, up 25 pounds since his visit last year.  Blood pressure is  116/76, pulse 68 and regular.  HEENT:  Anicteric sclerae.  Oropharynx clear.  CHEST:  Clear to auscultation bilaterally.  CARDIAC:  Regular rate and rhythm without murmurs appreciated.  ABDOMEN:  Large, soft, nontender, nondistended.  Normoactive bowel  sounds.  No palpable organomegaly, masses or hernia.   ASSESSMENT/PLAN:  1. Crohn's disease status post ileocecal resection.  He remains      asymptomatic.  Continue Asacol 800 mg t.i.d.  Return office visit      in one year.  2. Gastroesophageal reflux disease.  Continue antireflux measures and      Prilosec OTC 1 p.o. q.a.m.  3. Personal history of adenomatous colon polyps.  We will defer his      surveillance colonoscopy to the maximum interval of five years,      which will be May of 2010, given his need for Coumadin and Plavix.     Colton Lamb. Fuller Plan, MD,  [facility]  Electronically Signed    MTS/MedQ  DD: 09/12/2007  DT: 09/13/2007  Job #: [id number]   cc:   Octavia Heir, MD  [facility]

## 2010-12-17 NOTE — Cardiovascular Report (Signed)
NAMEGEVIN, PEREA NO.:  [id number]   MEDICAL RECORD NO.:  [id number]          PATIENT TYPE:  INP   LOCATION:  6525                         FACILITY:  [facility]   PHYSICIAN:  Quay Burow, M.D.   DATE OF BIRTH:  Jan 02, 1951   DATE OF PROCEDURE:  DATE OF DISCHARGE:                            CARDIAC CATHETERIZATION   HISTORY OF PRESENT ILLNESS:  Mr. Bounds is a 60 year old moderately overweight white male with  history of CAD status post LAD PCI remotely, PDA atherectomy in October  last year and circumflex stenting in January this year by Dr. Ellouise Newer  at [facility]  Other problems include hypertension, hyperlipidemia and  Crohn's disease.  He was admitted on September 02, 2006, with unstable  angina.  His pain was relieved with IV nitroglycerin and heparin.  Rule  out for myocardial infarction.  He presents now for diagnostic coronary  arteriography to define his anatomy and rule out ischemic etiology.   DESCRIPTION OF PROCEDURE:  The patient was brought to the second floor  [facility] cardiac catheterization laboratory in a postabsorptive state.  He was premedicated with p.o. Valium and received IV Versed in the lab  because of anxiety.   His right groin was prepped and draped in the usual sterile fashion, and  1% Xylocaine was used for local anesthesia.  A 6-French sheath was  inserted into the right femoral artery using standard Seldinger  technique.  A 6-French right/left Judkins diagnostic catheter as well as  6-French pigtail catheter were used for selective coronary angiography  and left ventriculography, respectively.  Visipaque dye was used through  the entire case.  Aorta , ventricular and blood pressures were recorded.   HEMODYNAMIC RESULTS:  Aortic systolic pressure 759, diastolic pressure  74.  Ventricle systolic pressure 163, end-diastolic pressure 15.   SELECTIVE CORONARY ANGIOGRAPHY:  1. Left main normal limits LAD; proximal LAD stent was widely  patent.  2. Circumflex; large codominant vessel with a patent stent.  3. Right coronary; there was a 40-50% segmental stenosis in the distal      right.  There was an 80% restenosis in the mid PDA in the setting      of a prior cutting balloon atherectomy.   Left ventriculography; RAO left ventriculograms was performed using 25  mL of Visipaque dye at 12 mL per second.  The LVEF estimated at  approximately 55% with moderate distal inferoapical hypokinesia in the  PDA territory.   IMPRESSION:  Mr. Lall had some restenosis of his mid PDA lesion which  was atherectomized approximately 4 months ago.  We will proceed with  __________ guided PCI and stenting using antiemetics and drug-eluting  stent.   DESCRIPTION OF PROCEDURE:  The existing 6-French sheath and right  femoral artery was exchanged over wire for 7-French sheath.  The patient  had been on aspirin and Plavix and received an additional 300 mg of p.o.  Plavix.  He was premedicated with IV Pepcid, Benadryl and Solu-Medrol  for contrast allergy prophylaxis.  Visipaque dye was used through the  entirety of the case.  The patient received 200  mcg of intracoronary  coronary nitroglycerin twice during the case.   Using a 7-French JR-4 guide catheter along with an OM-4 __________ soft  wire and a Galaxy IBIS catheter, ultrasound was performed.  The proximal  reference was 2.6.  The lesion was occlusive around the catheter.   The lesion was predilated with a 20:12 Maverick at nominal pressures and  stented with a 25:13 Cypher at 14 atmospheres (2.6 mm), this resulted in  reduction of 80% stenosis to 0% residual with excellent flow in  dissection.  The patient tolerated procedure well.  Guidewire and  catheter removed.  Sheath was sewn securely in place.  Patient in stable  condition.  Sheath will be removed in 2 hours.  The patient will be  hydrated, treated aspirin and Plavix and discharged home in her morning  if he remains  stable overnight.  Left lab in stable condition.      Quay Burow, M.D.  Electronically Signed     JB/MEDQ  D:  09/11/2006  T:  09/11/2006  Job:  [id number]   cc:   [facility]  [facility]  [place], [place] Dr. Glenna Fellows, MD

## 2010-12-17 NOTE — Discharge Summary (Signed)
NAMEBRADYN, SOWARD NO.:  [id number]   MEDICAL RECORD NO.:  [id number]          PATIENT TYPE:  INP   LOCATION:  1027                         FACILITY:  [facility]   PHYSICIAN:  Octavia Heir, MD  DATE OF BIRTH:  1951/05/30   DATE OF ADMISSION:  09/09/2006  DATE OF DISCHARGE:  09/12/2006                               DISCHARGE SUMMARY   DISCHARGE DIAGNOSES:  1. Unstable angina, in-stent restenosis of a previously placed      posterior descending artery stent, treated with angioplasty this      admission.  2. Coronary disease with an anterior myocardial infarction, March      2007, treated with left anterior descending intervention in [place],      posterior descending artery percutaneous coronary intervention and      stenting by Dr. [name] at [facility], October 2007, circumflex      percutaneous coronary intervention and stenting at [facility] by Dr. [name] on August 08, 2006.  3. Treated hypertension.  4. Treated dyslipidemia, LDL was 71.  5. Crohn's colitis.   HOSPITAL COURSE:  The patient is a pleasant 61 year old male followed by  Dr. Ilene Qua in [place] and Dr. Tami Ribas in [place].  He had an  anterior in [place] in March 2007 and had a DES placed.  He had unstable  angina in October 2007 had a PDA, DES, by Dr. [name].  In January 2008,  he had recurrent angina and had a PCI to the circumflex with a DES.  Dr.  Tami Ribas had seen him in followup, September 07, 2006 and the patient was  doing well.  On the morning of the 9th though, he woke up with  substernal chest pain like someone sitting on my chest.  He took  nitroglycerin with some relief, but his symptoms recurred.  He was seen  in the ER at [facility] and admitted.  Troponins and CKs were  negative.  He was started on heparin and nitrates.  He was set up for  diagnostic catheterization which was done September 11, 2006 by Dr.  Gwenlyn Found.  EF was 50% to 55%.  The LAD stent was patent,  circumflex stent  was patent and the PDA stent had an 80% in-stent restenosis; this was  dilated by Dr. Gwenlyn Found.  We feel he can be discharged September 12, 2006.  He did receive steroids pre-cath for history of shellfish allergy.   DISCHARGE MEDICATIONS:  1. Plavix 75 mg once a day.  2. Coated aspirin once a day.  3. Lipitor 40 mg a day.  4. Coreg CR 80 mg once a day.  5. Lisinopril 20 mg once a day.  6. Prilosec 20 mg a day.  7. Imdur 30 mg a day.  8. Nitroglycerin sublingual p.r.n.   LABORATORY DATA AT DISCHARGE:  White count is 17.6, hemoglobin 14.9,  hematocrit 42, platelets 242,000.  Sodium 138, potassium 3.8, BUN 14,  creatinine 1.0.  Liver functions were normal.  CK-MB and troponins were  negative.  Cholesterol is 134 with an LDL of 71, HDL 47, triglycerides  78.  TSH is 0.88.   Chest x-ray shows mild cardiomegaly without acute disease.   Urinalysis is unremarkable.  INR is 1.1.   EKG shows sinus rhythm with poor anterior R-wave progression.   DISPOSITION:  The patient is discharged in stable condition.  We feel  his white count elevation at discharge was secondary to the steroid he  received pre cath.  The patient does fairly strenuous work and he should  be kept out until cleared by Dr. Tami Ribas; he has an appointment on  February 21 at 10 a.m. in [place].      Erlene Quan, P.A.      Octavia Heir, MD  Electronically Signed    LKK/MEDQ  D:  09/12/2006  T:  09/12/2006  Job:  [id number]   cc:   Lorina Rabon, [place] Dr. Gaylan Gerold

## 2010-12-17 NOTE — Assessment & Plan Note (Signed)
[facility] OFFICE NOTE   Colton Lamb, Colton Lamb                     MRN:          [id number]  DATE:09/08/2006                            DOB:          10-02-1950    This is a return office visit for Crohn's disease and GERD.  He has no  gastrointestinal complaints.  He denies any reflux symptoms, dysphagia,  odynophagia, abdominal pain, rectal pain, diarrhea, hematochezia or  weight loss.  He is status post hospitalization at [facility]  with a drug-eluting coronary artery stent placed by Dr. [name] on January  23rd.  He has no ongoing problems with chest pain or shortness of breath  at this time.  He has a followup appointment scheduled with Dr. Tami Ribas.   CURRENT MEDICATIONS LISTED ON THE CHART:  Updated and reviewed.   MEDICATION ALLERGIES:  1. TETRACYCLINE.  2. SHELLFISH.  3. IODINE.   EXAM:  No acute distress.  Weight 246.6, blood pressure is 122/74, pulse  68 and regular.  CHEST:  Clear to auscultation bilaterally.  CARDIAC:  Regular rate and rhythm without murmurs appreciated.  ABDOMEN:  Soft and nontender with normoactive bowel sounds.   ASSESSMENT AND PLAN:  1. Crohn's disease status post ileocecal resection.  He remains      asymptomatic.  Will obtain recent blood work from Dr. Tami Ribas and      his primary physician Dr. Ilene Qua.  Obtain a B12 level today.  2. Personal history of adenomatous colon polyps.  Recall colonoscopy      was due in May of 2008; however, he will likely need to remain on      Plavix, without discontinuing it, for at least an entire year      following his drug-eluting stent placement and we will postpone his      recall colonoscopy to May of 2009.  3. Gastroesophageal reflux disease.  Continue antireflux measures and      Prilosec.     [name]. Fuller Plan, MD, [facility]  Electronically Signed    MTS/MedQ  DD: 09/08/2006  DT: 09/08/2006  Job #: [id number]   cc:   Faythe Ghee, Dr.  Octavia Heir, MD

## 2010-12-17 NOTE — Discharge Summary (Signed)
Colton Lamb, PEOPLE NO.:  [id number]   MEDICAL RECORD NO.:  [id number]          PATIENT TYPE:  INP   LOCATION:  2007                         FACILITY:  [facility]   PHYSICIAN:  Octavia Heir, MD  DATE OF BIRTH:  [birth year]   DATE OF ADMISSION:  10/25/2006  DATE OF DISCHARGE:  10/28/2006                               DISCHARGE SUMMARY   DISCHARGE DIAGNOSES:  1. Paroxysmal atrial fibrillation; sinus rhythm at discharge.  2. Coronary disease with a Cypher stent placed in February of 2008.  3. Hypertension.  4. Hyperlipidemia.  5. Exogenous obesity.  6. Anticoagulation with Coumadin.  7. Obstructive sleep apnea with need for sleep study.   CONDITION ON DISCHARGE:  Improved.   MEDICATIONS ON DISCHARGE:  1. Plavix 75 mg daily.  2. Lisinopril 20 mg daily.  3. Asacol 400 mg 2-3 times per day.  4. Prilosec 20 mg daily.  5. Coreg CR 80 mg daily.  6. Lipitor 40 mg every week.  7. Isosorbide mononitrate 30 mg daily.  8. Aspirin 81 mg daily.  9. Nitroglycerin sublingual as needed for chest pain.  10.Cardizem CD 180 mg daily, begin at 6:00 p.m.  11.Coumadin 5 mg take 1-1/2 pills Saturday, Sunday and Monday; recheck      pro time on Tuesday.  12.You are scheduled for a 2-D echocardiogram and stress Myoview,      [place] in the office, will call you with date of time.   DISCHARGE INSTRUCTIONS:  1. Increase activity slowly.  2. May shower or bathe.  3. Low sodium, heart-healthy diet.  4. If any awareness of irregular heart rate, call the office.  5. Follow up with Dr. Tami Ribas; the office will call with date and      time.   HISTORY OF PRESENT ILLNESS:  The patient had been having fatigue since  the Monday prior to admission on October 25, 2006.  He is a 60 year old  male with history of coronary disease with an anterior MI; PCI to the  LAD March of 2007; PDA arthrectomy in October of 2007; PCI of circumflex  in January of 2008; and a PCI of the right PDA in  February of 2008.  He  asked to be seen secondary to waking up with chest discomfort early  Monday morning, took three nitroglycerin, and the pain resolved.  He did  notice extreme fatigue since that time.  No other associated symptoms.  He did note his heart rate was increased after lunch on the day of  admission.  He had been compliant with his medications.  He went to the  office at [facility] and was found to be in atrial fibrillation,  uncontrolled ventricular response.  Dr. Tami Ribas was contacted and the  patient was sent to [facility] for further evaluation.   PAST MEDICAL HISTORY:  As stated for coronary.  He also has  hyperlipidemia; hypertension; Crohn's disease; and the coronary disease.   ALLERGIES:  SHRIMP and TETRACYCLINE.   HOSPITAL COURSE:  On arrival at [facility], he was in sinus rhythm without  complaints.  He was put on IV heparin  and p.o. Cardizem and then started  on IV Cardizem.  Coumadin was held until a decision could be made  concerning cardiac catheterization.  He had nonspecific EKG changes.  By  October 26, 2006, he was stable, enzymes were negative.   He was in and out of atrial fibrillation.  Medications were adjusted and  it was decided to add Coumadin to his medical regimen.  He continued on  heparin.   PHYSICAL EXAMINATION ON DISCHARGE:  VITAL SIGNS:  On the day of  discharge, blood pressure was 120/68, pulse 65, respirations 18,  afebrile, room air oxygen saturation 99%.  LUNGS:  Lungs were clear.  ABDOMEN:  Soft, nontender.  Positive bowel sounds.  HEART:  Regular rate and rhythm.  EXTREMITIES:  Without edema.   LABORATORY INVESTIGATIONS:  Admitting labs:  Hemoglobin 14.6, hematocrit  42.4.  WBC 10.4, platelets 247,000, neutrophils 66, lymphs 27, monos 6,  eos 2, baso 0.  These remained stable.   Pro time was 13.2, INR of 1, and PTT 26.  Remained low at discharge and  will continue on Coumadin.   Chemistries:  Sodium 137, potassium 4.3, chloride 108,  bicarbonate 26,  glucose 121, BUN 11, creatinine was 0.91, and total protein 6.3, albumin  3.4, AST 20, ALT 103, total bilirubin 0.6, magnesium 2.4.  CK 61, MB  1.2, troponin I 0.03.  BNP 105.  TSH 2.179.   Chest x-ray on admission:  Mild cardiomegaly without acute disease.  EKGs:  Initial EKG sinus rhythm, nonspecific changes, ST, follow up  continued sinus rhythm on the 27th, the 28th and the 29th.  He had  episodic atrial fibrillation in between.   2-D echocardiogram:  Ejection fraction was 45-55%, mid to distal  anterior septal wall with severe HK to AK.  The left ventricle was  mildly to moderate dilated.  There was mild right ventricular  hypertrophy.   The patient's course was previously described.  He will follow up as an  outpatient along with pro time evaluations.  He was discharged by Dr.  Mathis Bud and was stable.      Otilio Carpen. Richarda Overlie      Octavia Heir, MD  Electronically Signed    LRI/MEDQ  D:  12/17/2006  T:  12/17/2006  Job:  [id number]   cc:   Octavia Heir, MD  Clinton Gallant, M.D.

## 2010-12-17 NOTE — Cardiovascular Report (Signed)
NAME:  EVON, LOPEZPEREZ NO.:  [id number]   MEDICAL RECORD NO.:  [id number]          PATIENT TYPE:  INP   LOCATION:  6222                         FACILITY:  [facility]   PHYSICIAN:  Shelva Majestic, M.D.     DATE OF BIRTH:  07-Apr-1951   DATE OF PROCEDURE:  05/19/2006  DATE OF DISCHARGE:                              CARDIAC CATHETERIZATION   INDICATIONS:  Mr. Rodrickus Min is a 60 year old gentleman who  suffered an anterior wall myocardial infarction while in [place] in March  2007.  At that time, he underwent insertion of a 3.0 x 20 mm TAXUS stent in  his proximal left anterior descending artery.  The patient had been doing  well until this morning when he developed significant substernal chest  discomfort leading to his evaluation at [facility].  Previously, he  had seen But[facility] Carroll/Dr. Tami Ribas. Because of worrisome symptoms of  unstable angina, in the emergency room he was given Lovenox and IV  nitroglycerin, and plans were made to transfer him to [facility]  for further evaluation and treatment.   His ECG did not show acute ST-segment changes.  There is a question of  SHELLFISH allergy, and he was premedicated for his catheterization.   PROCEDURE:  After medication with Versed for conscious sedation, 2 mg, the  patient was prepped and draped in the usual fashion.  Right femoral artery  was punctured anteriorly, and a 5-French sheath was inserted without  difficulty.  Diagnostic catheterization was done utilizing 5-French Judkins  4 left and right heart catheters.  A 5-French pigtail catheter was used for  biplane cine left angiography as well as distal aortography.  At this point,  I broke scrub, reviewed the cineangiograms in detail with the patient.  There did not appear to be any significant restenosis at the previously  placed stented segment.  There was disease in the distal codominant  circumflex vessel of 50-60%, but the tightest lesion  appeared to be in the  PDA branch of the right coronary artery where there was 90% stenosis.  I  discussed options with the patient, and plans were made to attempt  percutaneous coronary intervention of his PDA branch of his right coronary  artery.  The sheath was upgraded to a 6-French system.  Double bolus  internal and weight-adjusted heparinization were administered.  ACT was  documented therapeutic; 150 mg of additional Plavix were also administered.  An FR-4 guide was used for the interventional procedure.  A Medtronic Cougar  wire was advanced down the RCA beyond the mid PDA stenosis.  Due to the  vessel size being small and also where a branch arose, decision was made  attempt cutting balloon arthrotomy.  Initially a 2.0 x 10 mm cutting balloon  was inserted, and several cuts were made with gradual titration up to 7  atmospheres.  This was then upgraded to a 2.25 x 10 mm cutting balloon.  Again, several additional dilatations were made up to 7-8 atmospheres.  Scout angiography confirmed an excellent angiographic result with TIMI-3  flow without evidence for dissection.  The patient  tolerated the procedure  well and returned to his room in satisfactory condition.   HEMODYNAMIC DATA:  Central aortic pressure was 115/71.  Left ventricle  pressure is 115/16, post A-wave 24.   ANGIOGRAPHIC DATA:  Left main coronary artery had mild ostial tapering of  20% prior to trifurcating into an LAD, the ramus intermediate vessel, and a  codominant circumflex system.   The LAD immediately gave rise to a large first bifurcating diagonal vessel  and moderate-sized septal perforating artery.  The stent in the proximal LAD  was widely patent.  The remainder of the LAD was free of significant disease  with the LAD extending to the LV apex.   The intermediate vessel was moderate-size vessel that had minimal luminal  irregularity of 10% prior to its bifurcation.   The circumflex vessel gave rise to  two major marginal vessels, a left atrial  circumflex branch and in the distal portion just prior to giving rise to a  moderate-size posterolateral-like vessel, there appeared to be eccentric  narrowing of 50-60%.  In some views, this appeared at least 60%.  However,  in other views, this appeared less than 50%.  There was brisk TIMI-3 flow.   The right coronary artery was a moderate-size vessel proximally with minimal  luminal irregularity.  There was a diffuse area of 40% narrowing beyond the  crux prior to the PDA takeoff.  The mid PDA had a 90% stenosis that was  fairly focal.   Biplane cine left ventriculography revealed low-normal global LV  contractility with an ejection fraction of approximately 50%.  There was  mild residual mid anterolateral hypocontractility in the RAO projection.  In  the LAO projection, contractility appeared fairly normal.   Distal aortography did not reveal any significant aortoiliac disease.   Following successful cutting balloon arthrotomy of the right coronary artery  mid PDA lesion with a 2.0 x 10 mm and ultimate 2.25 x 10 mm cutting balloon,  the 90% stenosis was reduced to 0%.  There is TIMI-3 flow.  There was no  evidence for dissection.   IMPRESSION:  1. Low normal left ventricular contractility with ejection fraction      approximately 50% with minimal residual hypocontractility mid      anterolateral wall in this patient status post remote anterior      myocardial infarction treated with stenting of his proximal left      anterior descending in March 2007 while in [place].  2. Mild 20% smooth taper ostially of the left main coronary artery.  3. No restenosis of the previously placed left anterior descending stent.  4. Mild luminal irregularity of 10% in the ramus intermediate vessel.  5. A 50-60% somewhat eccentric stenosis in a large circumflex vessel      proximal to the posterolateral artery vessel takeoff. 6. Diffuse 40% smooth narrowing  in the distal right coronary artery with      focal 90% mid posterior descending artery stenosis.  7. Successful cutting balloon arthrotomy of the mid posterior descending      artery lesion with the 90% stenosis being reduced to 0%, done with      double-bolus internal and weight-adjusted heparinization as well as      dual antiplatelet therapy with aspirin and Plavix.           ______________________________  Shelva Majestic, M.D.     TK/MEDQ  D:  05/19/2006  T:  05/21/2006  Job:  [id number]   cc:   Herbie Baltimore  H. Tami Ribas, MD  Roderic Palau, M.D.  Billey Gosling, M.D.

## 2010-12-17 NOTE — H&P (Signed)
Colton Lamb, Colton Lamb NO.:  [id number]   MEDICAL RECORD NO.:  [id number]          PATIENT TYPE:  INP   LOCATION:  2041                         FACILITY:  [facility]   PHYSICIAN:  Octavia Heir, MD  DATE OF BIRTH:  [birth year]   DATE OF ADMISSION:  09/09/2006  DATE OF DISCHARGE:                              HISTORY & PHYSICAL   CHIEF COMPLAINT:  Chest pain.   HPI:  Colton Lamb is a 60 year old male with a history of anterior MI in  [place] in March of 2007, treated with a PCI.  He had recurrent unstable  angina in October of 2007 and had a catheterization by Dr. [name] with  subsequent PDA intervention.  The previously dilated LAD site was okay.  In January of 2008, he had recurrent angina and had a PCI to a 70% OM  lesion.  He had a residual 70% PDA narrowing.  Dr. Tami Ribas had seen him  back in the office in [place], September 07, 2006 and he was doing  well.  His Coreg was increased to 80 mg a day.  On the morning of  admission, he woke up at about 10 o'clock with substernal chest pain,  like someone sitting on my chest.  He had some mild shortness of  breath, but no nausea, vomiting or diaphoresis.  There was no radiation  to his arms, back or jaw.  He took a nitroglycerin at home, which helped  his symptoms, but they recurred.  He called [place] and was asked to come to  the emergency room as his pain had recurred and was nitrate responsive.  He is admitted now for further evaluation.   PAST MEDICAL HISTORY:  Remarkable for:  1. Crohn's colitis.  He had surgery for this in 2000, it was      __________ .  2. He has gastroesophageal reflux and is seen by Dr. Fuller Plan.  3. He has treated hypertension and dyslipidemia.   CURRENT MEDICATIONS:  1. Plavix 75 mg a day.  2. Aspirin daily.  3. Coreg 80 mg a day.  4. Lisinopril 20 mg a day.  5. Prilosec 20 mg a day.  6. Lipitor 40 mg a day.  7. Asacol 400 mg 2 tablets t.i.d.  8. Imdur 30 mg a day.   ALLERGIES:  HE IS  ALLERGIC TO:  1. SHELLFISH.  2. TETRACYCLINE.   SOCIAL HISTORY:  He is married and has 1 daughter.  He works in the  Photographer at [employer].  He does not drink alcohol or  use tobacco.  He tries to exercise 2 days a week.   FAMILY HISTORY:  Is remarkable that his father had bypass and is alive  at 36.  One sister had coronary disease.   REVIEW OF SYSTEMS:  Essentially unremarkable, except as noted above.  He  denies any GI bleeding or melena.  He has not had thyroid problems.  He  denies any kidney stones, dysuria or prostate problems.  He has not had  recent fevers or chills.   PHYSICAL EXAMINATION:  VITAL SIGNS:  Blood pressure 154/82.  Pulse 80.  Temp 97.8.  GENERALLY:  He is a well-developed, overweight male in no acute  distress.  HEENT:  Normocephalic.  Extraocular movements are intact.  Sclerae are  nonicteric.  NECK:  Without JVD or bruit.  CHEST:  Clear to auscultation and percussion.  CARDIAC EXAM:  Regular rate and rhythm without murmur, rub or gallop.  Normal S1 and S2.  ABDOMEN:  Nontender.  No hepatosplenomegaly.  Bowel sounds are present.  EXTREMITIES:  Without edema.  Distal pulses are 3+/4.  In his right  groin, of his cath 3 weeks ago, is without ecchymosis.  There is no  bruit.  NEURO EXAM:  Grossly intact.  He is awake, alert, oriented and  cooperative.  Moves all extremities without any deficits.  SKIN:  Warm and dry.   EKG shows sinus rhythm with poor anterior R-wave progression, which is  not changed.   IMPRESSION:  1. Unstable angina.  2. Coronary disease, anterior myocardial infarction in March of 2007,      treated with left anterior descending intervention in [place],      unstable angina October of 2007, treated with PDA and PCI at [facility], unstable angina, January of 2008, treated with circumflex      intervention at [facility] by Dr. [name].  3. Treated hypertension.  4. Treated dyslipidemia.  5. History of  Crohn.  6. Gastroesophageal reflux.   PLAN:  The patient was seen by Dr. Gwenlyn Found and myself today in the  emergency room.  He will be admitted for further evaluation.  He will be  started on IV nitrates and heparin.      Erlene Quan, P.A.      Octavia Heir, MD  Electronically Signed    LKK/MEDQ  D:  09/10/2006  T:  09/11/2006  Job:  [id number]

## 2010-12-17 NOTE — Discharge Summary (Signed)
Colton Lamb, Colton Lamb              ACCOUNT NO.:  [id number]   MEDICAL RECORD NO.:  [id number]          PATIENT TYPE:  INP   LOCATION:  6527                         FACILITY:  [facility]   PHYSICIAN:  Eden Lathe. Einar Gip, MD       DATE OF BIRTH:  06/05/1951   DATE OF ADMISSION:  05/19/2006  DATE OF DISCHARGE:  05/20/2006                                 DISCHARGE SUMMARY   HISTORY OF PRESENT ILLNESS:  Colton Lamb is a 60 year old white married male  patient with a history of acute anterior MI.  In March of 2007, he had a  Taxus stent placed to his LAD.  He has done well since that time.  His  stress Myoview August of 2007 showed an EF of 51% and no ischemia.  He  recently had a titration of chloride.  Today, he awakened with nausea and  chest pain with radiation to his left jaw, left arm, and between his  shoulder blade.  EMS was called.  He was given nitroglycerin spray and 4  baby aspirins with improvement.  He was taken to [facility] ER.  From there,  he was transferred to [facility] for further cardiac evaluation.  He was seen by Dr. [name], who decided to take him to the cath lab.  He had a  progressive coronary artery disease with 90% in his distal RCA.  He  underwent cutting balloon angioplasty.  He received IV nitroglycerin,  Integrilin, heparin, and Plavix.  He also had a 50-60% mid-to-distal  circumflex with 40% mid RCA lesion.  His EF was approximately 50%.  On the  morning of May 20, 2006, he was seen by Dr. Einar Gip and considered stable  for discharge home.  His blood pressure was 108/47.  His temperature was 97.  His heart rate was 66.   LABORATORY DATA:  His hemoglobin was 12.5 and hematocrit 35.5.  Platelets  were 131 and WBCs 13.9.  His BUN was 13 and creatinine was 1.0.   DISCHARGE MEDICATIONS:  1. Prilosec 20 mg 1 tablet per day.  2. ---800 mg 2 tablets per day.  3. Aspirin 225 mg one time per day.  4. Plavix 75 mg 1 tablet per day.  5. Lisinopril  20 mg 1 tablet  per day.  6. Coreg CR 40 mg 1 tablet per day.  7. Imdur 30 mg 1 tablet per day.  8. Lipitor 80 mg 1 tablet per day.  9. Nitroglycerin p.r.n.   He should follow up with Roderic Palau in the [place] office on Wednesday  or Thursday.  He was told by Dr. Einar Gip to go back to work on Monday.  He  should not do any lifting, pushing, pulling, or strenuous activity for 5  days.  He should do no driving today.  He will need to have a follow up  stress test in 4 to 6 weeks to assess the circumflex lesion.   DISCHARGE DIAGNOSES:  1. Unstable angina.  2. Coronary artery disease, progressive, with a history of anterior      myocardial infarction and Taxus stenting  to the left anterior      descending in March 2007.  It was patent on cath at this admission.  He      did have progressive disease and RCA circumflex.  3. Ejection fraction of 50%.  4. History of Crohn's disease.  5. Hyperlipidemia.  6. Hypertension.      Cyndia Bent, N.P.      Eden Lathe. Einar Gip, MD  Electronically Signed    BB/MEDQ  D:  05/20/2006  T:  05/21/2006  Job:  [id number]   cc:   Roderic Palau, NP

## 2010-12-25 ENCOUNTER — Other Ambulatory Visit: Payer: Self-pay | Admitting: Cardiovascular Disease

## 2010-12-26 ENCOUNTER — Other Ambulatory Visit: Payer: Self-pay | Admitting: Cardiovascular Disease

## 2010-12-28 ENCOUNTER — Other Ambulatory Visit: Payer: Self-pay

## 2010-12-28 MED ORDER — EZETIMIBE 10 MG PO TABS: 10.0000 mg | ORAL_TABLET | Freq: Every day | ORAL | Status: DC

## 2010-12-29 ENCOUNTER — Ambulatory Visit (INDEPENDENT_AMBULATORY_CARE_PROVIDER_SITE_OTHER): Payer: BC Managed Care – PPO | Admitting: Emergency Medicine

## 2010-12-29 DIAGNOSIS — I4891 Unspecified atrial fibrillation: Secondary | ICD-10-CM

## 2010-12-29 DIAGNOSIS — Z7901 Long term (current) use of anticoagulants: Secondary | ICD-10-CM

## 2010-12-29 LAB — POCT INR: INR: 2.5

## 2010-12-31 ENCOUNTER — Encounter: Payer: Self-pay | Admitting: Cardiovascular Disease

## 2010-12-31 ENCOUNTER — Ambulatory Visit (INDEPENDENT_AMBULATORY_CARE_PROVIDER_SITE_OTHER): Payer: BC Managed Care – PPO | Admitting: Cardiovascular Disease

## 2010-12-31 DIAGNOSIS — R609 Edema, unspecified: Secondary | ICD-10-CM

## 2010-12-31 DIAGNOSIS — I4891 Unspecified atrial fibrillation: Secondary | ICD-10-CM

## 2010-12-31 DIAGNOSIS — Z7901 Long term (current) use of anticoagulants: Secondary | ICD-10-CM

## 2010-12-31 DIAGNOSIS — E785 Hyperlipidemia, unspecified: Secondary | ICD-10-CM

## 2010-12-31 DIAGNOSIS — I2510 Atherosclerotic heart disease of native coronary artery without angina pectoris: Secondary | ICD-10-CM

## 2010-12-31 MED ORDER — DIGOXIN 250 MCG PO TABS: 250.0000 ug | ORAL_TABLET | Freq: Every day | ORAL | Status: DC

## 2010-12-31 NOTE — Progress Notes (Signed)
   Patient ID: Colton Lamb, male    DOB: 09/16/1950, 60 y.o.   MRN: [id number]  HPI Comments: Colton Lamb is a 60 year old gentleman with a history of paroxysmal atrial fibrillation, last epsiode in 2008 per the patient,  hyperlipidemia, coronary artery disease, PCI x3 to the LAD, circumflex, PDA and PL branch, ejection fraction 45-50% with anterior hypokinesis, last catheterization in 2008 with admission for unstable angina found to have in-stent restenosis in the RCA and had treatment with a cypher stent to the OM who presents for routine followup.He has sleep apnea, uses CPAP.    He denies any chest pain, shortness of breath.He continues to have lower extremity edema bilaterally. He is not improved with diuretic despite an increase in the dose. Otherwise he feels wel lwith  no complaints.   cardiac catheterization February 2008 showed patent LAD stent proximally, large codominant circumflex with patent stent, RCA with 40-50% stenosis in the distal right, 80% instant restenosis in the mid PDA in the setting of a prior cutting balloon atherectomy. A 2.5 x 13 mm Cypher stent was placed.   Last stress test February 2010 showing moderate to severe perfusion defect in the mid anteroseptal, apical anterior, apical septal, apical and septal regions consistent with infarct or scar. Also in the inferior wall. No significant ischemia. Ejection fraction 54%.     Echocardiogram March 2008 shows ejection fraction 45-55%, mild to moderately dilated, mid to distal anterior septal wall with severe hypokinesis, mild LVH   EKG shows normal sinus rhythm with rate 65 beats per minute, poor R-wave progression, unable to rule out anterior infarct, left axis deviation     Review of Systems  Constitutional: Negative.   HENT: Negative.   Eyes: Negative.   Respiratory: Negative.   Cardiovascular: Positive for leg swelling.  Gastrointestinal: Negative.   Musculoskeletal: Negative.   Skin: Negative.     Neurological: Negative.   Hematological: Negative.   Psychiatric/Behavioral: Negative.   All other systems reviewed and are negative.    BP 110/70  Pulse 70  Ht 5' 11"  (1.803 m)  Wt 283 lb (128.368 kg)  BMI 39.47 kg/m2   Physical Exam  Nursing note and vitals reviewed. Constitutional: He is oriented to person, place, and time. He appears well-developed and well-nourished.  HENT:  Head: Normocephalic.  Nose: Nose normal.  Mouth/Throat: Oropharynx is clear and moist.  Eyes: Conjunctivae are normal. Pupils are equal, round, and reactive to light.  Neck: Normal range of motion. Neck supple. No JVD present.  Cardiovascular: Normal rate, regular rhythm, S1 normal, S2 normal, normal heart sounds and intact distal pulses.  Exam reveals no gallop and no friction rub.   No murmur heard.      1+ pitting edema to the mid shins  Pulmonary/Chest: Effort normal and breath sounds normal. No respiratory distress. He has no wheezes. He has no rales. He exhibits no tenderness.  Abdominal: Soft. Bowel sounds are normal. He exhibits no distension. There is no tenderness.  Musculoskeletal: Normal range of motion. He exhibits no edema and no tenderness.  Lymphadenopathy:    He has no cervical adenopathy.  Neurological: He is alert and oriented to person, place, and time. Coordination normal.  Skin: Skin is warm and dry. No rash noted. No erythema.  Psychiatric: He has a normal mood and affect. His behavior is normal. Judgment and thought content normal.           Assessment and Plan

## 2010-12-31 NOTE — Patient Instructions (Addendum)
Please stop the diltiazem. Start digoxin 0.25 mg Daily This change has been made to improve the leg edema. Monitor your blood pressure after the medication change.  Please call us if you have new issues that need to be addressed before your next appt.  We will call you for a follow up Appt. In 6 months Your physician recommends that you return for a FASTING lipid profile: next week (lipid/lft)

## 2011-01-01 NOTE — Assessment & Plan Note (Signed)
Maintaining sinus rhythm. Medications as detailed above with the start of digoxin. Continue anticoagulation for now.

## 2011-01-01 NOTE — Assessment & Plan Note (Signed)
I am concerned that his edema could be secondary to the diltiazem. We will stop the diltiazem and in an effort to maintain sinus rhythm, we'll start digoxin 0.25 mg daily.

## 2011-01-01 NOTE — Assessment & Plan Note (Addendum)
We have suggested he come in next week for a cholesterol panel. Would continue  medications as he is currently taking.

## 2011-01-01 NOTE — Assessment & Plan Note (Signed)
Currently with no symptoms of angina. No further workup at this time. Continue current medication regimen. 

## 2011-01-04 ENCOUNTER — Other Ambulatory Visit (INDEPENDENT_AMBULATORY_CARE_PROVIDER_SITE_OTHER): Payer: BC Managed Care – PPO | Admitting: *Deleted

## 2011-01-04 DIAGNOSIS — E785 Hyperlipidemia, unspecified: Secondary | ICD-10-CM

## 2011-01-04 LAB — LIPID PANEL
Cholesterol: 96 mg/dL (ref 0–200)
Triglycerides: 82 mg/dL (ref <150)

## 2011-01-04 LAB — HEPATIC FUNCTION PANEL
Bilirubin, Direct: 0.2 mg/dL (ref 0.0–0.3)
Indirect Bilirubin: 0.4 mg/dL (ref 0.0–0.9)
Total Protein: 7 g/dL (ref 6.0–8.3)

## 2011-01-17 ENCOUNTER — Encounter: Payer: Self-pay | Admitting: Cardiovascular Disease

## 2011-01-18 ENCOUNTER — Other Ambulatory Visit: Payer: Self-pay

## 2011-01-18 MED ORDER — ATORVASTATIN CALCIUM 80 MG PO TABS: 80.0000 mg | ORAL_TABLET | Freq: Every day | ORAL | Status: DC

## 2011-01-26 ENCOUNTER — Ambulatory Visit (INDEPENDENT_AMBULATORY_CARE_PROVIDER_SITE_OTHER): Payer: BC Managed Care – PPO | Admitting: Emergency Medicine

## 2011-01-26 DIAGNOSIS — Z7901 Long term (current) use of anticoagulants: Secondary | ICD-10-CM

## 2011-01-26 DIAGNOSIS — I4891 Unspecified atrial fibrillation: Secondary | ICD-10-CM

## 2011-01-26 LAB — POCT INR: INR: 3.5

## 2011-02-16 ENCOUNTER — Ambulatory Visit (INDEPENDENT_AMBULATORY_CARE_PROVIDER_SITE_OTHER): Payer: BC Managed Care – PPO | Admitting: Emergency Medicine

## 2011-02-16 DIAGNOSIS — Z7901 Long term (current) use of anticoagulants: Secondary | ICD-10-CM

## 2011-02-16 DIAGNOSIS — I4891 Unspecified atrial fibrillation: Secondary | ICD-10-CM

## 2011-02-16 LAB — POCT INR: INR: 3

## 2011-03-16 ENCOUNTER — Ambulatory Visit (INDEPENDENT_AMBULATORY_CARE_PROVIDER_SITE_OTHER): Payer: BC Managed Care – PPO | Admitting: Emergency Medicine

## 2011-03-16 DIAGNOSIS — I4891 Unspecified atrial fibrillation: Secondary | ICD-10-CM

## 2011-03-16 DIAGNOSIS — Z7901 Long term (current) use of anticoagulants: Secondary | ICD-10-CM

## 2011-03-16 LAB — POCT INR: INR: 1.8

## 2011-03-27 ENCOUNTER — Other Ambulatory Visit: Payer: Self-pay | Admitting: Gastroenterology

## 2011-03-29 ENCOUNTER — Telehealth: Payer: Self-pay

## 2011-03-29 MED ORDER — NIACIN ER (ANTIHYPERLIPIDEMIC) 1000 MG PO TBCR: 1000.0000 mg | EXTENDED_RELEASE_TABLET | Freq: Every day | ORAL | Status: DC

## 2011-03-29 NOTE — Telephone Encounter (Signed)
Refill requested for niaspan 1000 mg take one tablet daily.

## 2011-04-05 ENCOUNTER — Telehealth: Payer: Self-pay

## 2011-04-05 MED ORDER — LISINOPRIL 20 MG PO TABS: 20.0000 mg | ORAL_TABLET | Freq: Every day | ORAL | Status: DC

## 2011-04-05 NOTE — Telephone Encounter (Signed)
Refill request for lisinopril 20 mg take one tablet daily.

## 2011-04-13 ENCOUNTER — Ambulatory Visit (INDEPENDENT_AMBULATORY_CARE_PROVIDER_SITE_OTHER): Payer: BC Managed Care – PPO | Admitting: Emergency Medicine

## 2011-04-13 DIAGNOSIS — I4891 Unspecified atrial fibrillation: Secondary | ICD-10-CM

## 2011-04-13 DIAGNOSIS — Z7901 Long term (current) use of anticoagulants: Secondary | ICD-10-CM

## 2011-04-27 ENCOUNTER — Ambulatory Visit (INDEPENDENT_AMBULATORY_CARE_PROVIDER_SITE_OTHER): Payer: BC Managed Care – PPO | Admitting: Emergency Medicine

## 2011-04-27 DIAGNOSIS — Z7901 Long term (current) use of anticoagulants: Secondary | ICD-10-CM

## 2011-04-27 DIAGNOSIS — I4891 Unspecified atrial fibrillation: Secondary | ICD-10-CM

## 2011-05-12 ENCOUNTER — Other Ambulatory Visit: Payer: Self-pay | Admitting: *Deleted

## 2011-05-12 MED ORDER — CARVEDILOL PHOSPHATE ER 80 MG PO CP24: 80.0000 mg | ORAL_CAPSULE | Freq: Every day | ORAL | Status: DC

## 2011-05-25 ENCOUNTER — Ambulatory Visit (INDEPENDENT_AMBULATORY_CARE_PROVIDER_SITE_OTHER): Payer: BC Managed Care – PPO | Admitting: Emergency Medicine

## 2011-05-25 DIAGNOSIS — I4891 Unspecified atrial fibrillation: Secondary | ICD-10-CM

## 2011-05-25 DIAGNOSIS — Z7901 Long term (current) use of anticoagulants: Secondary | ICD-10-CM

## 2011-05-25 LAB — POCT INR: INR: 2.2

## 2011-05-25 MED ORDER — CLOPIDOGREL BISULFATE 75 MG PO TABS: 75.0000 mg | ORAL_TABLET | Freq: Every day | ORAL | Status: DC

## 2011-05-30 ENCOUNTER — Telehealth: Payer: Self-pay

## 2011-05-30 NOTE — Telephone Encounter (Signed)
Notified patient per Dr. Rockey Situ have patient stay off zetia 10 mg with a cholesterol in 3 months due to prior authorization denied for coverage. From the [employer] prior authorization team; A 30% or less reduction in LDL (such as that which is achieved with generic lovastatin) will get the patient to their LDL goal.

## 2011-06-02 ENCOUNTER — Other Ambulatory Visit: Payer: Self-pay | Admitting: Gastroenterology

## 2011-06-03 ENCOUNTER — Other Ambulatory Visit: Payer: Self-pay | Admitting: Cardiovascular Disease

## 2011-06-03 NOTE — Telephone Encounter (Signed)
Refill sent for digoxin.

## 2011-06-06 ENCOUNTER — Telehealth: Payer: Self-pay

## 2011-06-06 MED ORDER — ISOSORBIDE MONONITRATE ER 30 MG PO TB24: 30.0000 mg | ORAL_TABLET | Freq: Every day | ORAL | Status: DC

## 2011-06-06 NOTE — Telephone Encounter (Signed)
Refill sent for isosorbide 30 mg take one tablet daily.

## 2011-06-06 NOTE — Telephone Encounter (Signed)
Told patient that Asacol needs to be called by primary care physician.

## 2011-06-13 ENCOUNTER — Other Ambulatory Visit: Payer: Self-pay

## 2011-06-13 ENCOUNTER — Other Ambulatory Visit: Payer: Self-pay | Admitting: Gastroenterology

## 2011-06-13 MED ORDER — MESALAMINE 800 MG PO TBEC: 90.0000 | DELAYED_RELEASE_TABLET | Freq: Three times a day (TID) | ORAL | Status: DC

## 2011-06-22 ENCOUNTER — Ambulatory Visit (INDEPENDENT_AMBULATORY_CARE_PROVIDER_SITE_OTHER): Payer: BC Managed Care – PPO | Admitting: Emergency Medicine

## 2011-06-22 DIAGNOSIS — Z7901 Long term (current) use of anticoagulants: Secondary | ICD-10-CM

## 2011-06-22 DIAGNOSIS — I4891 Unspecified atrial fibrillation: Secondary | ICD-10-CM

## 2011-06-22 LAB — POCT INR: INR: 2.5

## 2011-07-22 ENCOUNTER — Other Ambulatory Visit: Payer: Self-pay | Admitting: Gastroenterology

## 2011-07-22 ENCOUNTER — Other Ambulatory Visit: Payer: Self-pay | Admitting: *Deleted

## 2011-07-22 NOTE — Telephone Encounter (Signed)
L/M for pt that he needs to make an office appointment for his yearly appointment  Sent in rx for Asacol

## 2011-08-07 ENCOUNTER — Other Ambulatory Visit: Payer: Self-pay | Admitting: Cardiovascular Disease

## 2011-08-08 ENCOUNTER — Telehealth: Payer: Self-pay

## 2011-08-08 MED ORDER — WARFARIN SODIUM 3 MG PO TABS: 3.0000 mg | ORAL_TABLET | ORAL | Status: DC

## 2011-08-08 NOTE — Telephone Encounter (Signed)
Refill warfarin

## 2011-08-10 ENCOUNTER — Ambulatory Visit (INDEPENDENT_AMBULATORY_CARE_PROVIDER_SITE_OTHER): Payer: BC Managed Care – PPO | Admitting: Gastroenterology

## 2011-08-10 ENCOUNTER — Other Ambulatory Visit: Payer: Self-pay

## 2011-08-10 ENCOUNTER — Ambulatory Visit (INDEPENDENT_AMBULATORY_CARE_PROVIDER_SITE_OTHER): Payer: BC Managed Care – PPO | Admitting: Emergency Medicine

## 2011-08-10 ENCOUNTER — Encounter: Payer: Self-pay | Admitting: Gastroenterology

## 2011-08-10 ENCOUNTER — Other Ambulatory Visit (INDEPENDENT_AMBULATORY_CARE_PROVIDER_SITE_OTHER): Payer: BC Managed Care – PPO

## 2011-08-10 VITALS — BP 100/60 | HR 64 | Ht 71.0 in | Wt 284.0 lb

## 2011-08-10 DIAGNOSIS — K219 Gastro-esophageal reflux disease without esophagitis: Secondary | ICD-10-CM

## 2011-08-10 DIAGNOSIS — K5080 Crohn's disease of both small and large intestine without complications: Secondary | ICD-10-CM

## 2011-08-10 DIAGNOSIS — I4891 Unspecified atrial fibrillation: Secondary | ICD-10-CM

## 2011-08-10 DIAGNOSIS — Z7901 Long term (current) use of anticoagulants: Secondary | ICD-10-CM

## 2011-08-10 LAB — COMPREHENSIVE METABOLIC PANEL
BUN: 8 mg/dL (ref 6–23)
CO2: 28 mEq/L (ref 19–32)
Calcium: 8.5 mg/dL (ref 8.4–10.5)
Chloride: 107 mEq/L (ref 96–112)
Creatinine, Ser: 0.9 mg/dL (ref 0.4–1.5)
GFR: 86.9 mL/min (ref >60.00)

## 2011-08-10 LAB — CBC WITH DIFFERENTIAL/PLATELET
Basophils Relative: 0.6 % (ref 0.0–3.0)
Eosinophils Absolute: 0.2 10*3/uL (ref 0.0–0.7)
HCT: 40.1 % (ref 39.0–52.0)
Hemoglobin: 13.8 g/dL (ref 13.0–17.0)
Lymphocytes Relative: 23 % (ref 12.0–46.0)
MCHC: 34.3 g/dL (ref 30.0–36.0)
Neutro Abs: 5.8 10*3/uL (ref 1.4–7.7)
RBC: 4.64 Mil/uL (ref 4.22–5.81)

## 2011-08-10 LAB — POCT INR: INR: 1.9

## 2011-08-10 MED ORDER — MESALAMINE 800 MG PO TBEC: 1.0000 | DELAYED_RELEASE_TABLET | Freq: Three times a day (TID) | ORAL | Status: DC

## 2011-08-10 NOTE — Progress Notes (Signed)
History of Present Illness: This is a 61 year old male with Crohn's ileocolitis and GERD who returns for followup. He is maintained on Asacol and has no gastrointestinal complaints. Denies weight loss, abdominal pain, constipation, diarrhea, change in stool caliber, melena, hematochezia, nausea, vomiting, dysphagia, reflux symptoms, chest pain.  Current Medications, Allergies, Past Medical History, Past Surgical History, Family History and Social History were reviewed in [employer] record.  Physical Exam: General: Well developed , well nourished, no acute distress Head: Normocephalic and atraumatic Eyes:  sclerae anicteric, EOMI Ears: Normal auditory acuity Mouth: No deformity or lesions Lungs: Clear throughout to auscultation Heart: Regular rate and rhythm; no murmurs, rubs or bruits Abdomen: Soft, non tender and non distended. No masses, hepatosplenomegaly or hernias noted. Normal Bowel sounds Musculoskeletal: Symmetrical with no gross deformities  Pulses:  Normal pulses noted Extremities: No clubbing, cyanosis, edema or deformities noted Neurological: Alert oriented x 4, grossly nonfocal Psychological:  Alert and cooperative. Normal mood and affect  Assessment and Recommendations:  1. Crohn's ileocolitis. Stable. Obtain CBC, CMET and B12 today. Refill Asacol HD 800 mg 3 times a day.  2. Personal history of adenomatous colon polyps. Surveillance colonoscopy May 2015.  3. GERD. Continue standard antireflux measures and omeprazole 20 mg daily.

## 2011-08-10 NOTE — Patient Instructions (Signed)
Go directly to the basement to have your labs drawn today. Your prescription for Asacol has been sent to your pharmacy.  cc: Franky Macho, MD

## 2011-08-27 ENCOUNTER — Other Ambulatory Visit: Payer: Self-pay | Admitting: Cardiovascular Disease

## 2011-08-29 ENCOUNTER — Telehealth: Payer: Self-pay

## 2011-08-29 MED ORDER — ATORVASTATIN CALCIUM 80 MG PO TABS: 80.0000 mg | ORAL_TABLET | Freq: Every day | ORAL | Status: DC

## 2011-08-29 NOTE — Telephone Encounter (Signed)
Refill sent for atorvastatin

## 2011-09-14 ENCOUNTER — Ambulatory Visit (INDEPENDENT_AMBULATORY_CARE_PROVIDER_SITE_OTHER): Payer: BC Managed Care – PPO | Admitting: Emergency Medicine

## 2011-09-14 DIAGNOSIS — Z7901 Long term (current) use of anticoagulants: Secondary | ICD-10-CM

## 2011-09-14 DIAGNOSIS — I4891 Unspecified atrial fibrillation: Secondary | ICD-10-CM

## 2011-10-05 ENCOUNTER — Ambulatory Visit (INDEPENDENT_AMBULATORY_CARE_PROVIDER_SITE_OTHER): Payer: BC Managed Care – PPO

## 2011-10-05 DIAGNOSIS — I4891 Unspecified atrial fibrillation: Secondary | ICD-10-CM

## 2011-10-05 DIAGNOSIS — Z7901 Long term (current) use of anticoagulants: Secondary | ICD-10-CM

## 2011-10-20 ENCOUNTER — Other Ambulatory Visit: Payer: Self-pay | Admitting: Cardiovascular Disease

## 2011-10-31 ENCOUNTER — Other Ambulatory Visit: Payer: Self-pay

## 2011-10-31 MED ORDER — LISINOPRIL 20 MG PO TABS: 20.0000 mg | ORAL_TABLET | Freq: Every day | ORAL | Status: DC

## 2011-11-02 ENCOUNTER — Ambulatory Visit (INDEPENDENT_AMBULATORY_CARE_PROVIDER_SITE_OTHER): Payer: BC Managed Care – PPO

## 2011-11-02 DIAGNOSIS — I4891 Unspecified atrial fibrillation: Secondary | ICD-10-CM

## 2011-11-02 DIAGNOSIS — Z7901 Long term (current) use of anticoagulants: Secondary | ICD-10-CM

## 2011-11-17 ENCOUNTER — Other Ambulatory Visit: Payer: Self-pay | Admitting: Cardiovascular Disease

## 2011-11-30 ENCOUNTER — Ambulatory Visit (INDEPENDENT_AMBULATORY_CARE_PROVIDER_SITE_OTHER): Payer: BC Managed Care – PPO

## 2011-11-30 DIAGNOSIS — Z7901 Long term (current) use of anticoagulants: Secondary | ICD-10-CM

## 2011-11-30 DIAGNOSIS — I4891 Unspecified atrial fibrillation: Secondary | ICD-10-CM

## 2011-12-14 ENCOUNTER — Ambulatory Visit (INDEPENDENT_AMBULATORY_CARE_PROVIDER_SITE_OTHER): Payer: BC Managed Care – PPO

## 2011-12-14 DIAGNOSIS — I4891 Unspecified atrial fibrillation: Secondary | ICD-10-CM

## 2011-12-14 DIAGNOSIS — Z7901 Long term (current) use of anticoagulants: Secondary | ICD-10-CM

## 2011-12-14 LAB — POCT INR: INR: 3.4

## 2012-01-11 ENCOUNTER — Ambulatory Visit (INDEPENDENT_AMBULATORY_CARE_PROVIDER_SITE_OTHER): Payer: BC Managed Care – PPO

## 2012-01-11 DIAGNOSIS — Z7901 Long term (current) use of anticoagulants: Secondary | ICD-10-CM

## 2012-01-11 DIAGNOSIS — I4891 Unspecified atrial fibrillation: Secondary | ICD-10-CM

## 2012-01-11 LAB — POCT INR: INR: 2.5

## 2012-01-24 ENCOUNTER — Other Ambulatory Visit: Payer: Self-pay | Admitting: Cardiovascular Disease

## 2012-01-24 NOTE — Telephone Encounter (Signed)
Spoke with pt today he is aware that he is due for a office visit with Dr. Rockey Situ. He mentioned that his wife has been in the hospital and has a lot going on at the time but he will contact office to set up appointment. Refilled Coreg.

## 2012-02-02 ENCOUNTER — Other Ambulatory Visit: Payer: Self-pay | Admitting: Cardiovascular Disease

## 2012-02-03 ENCOUNTER — Other Ambulatory Visit: Payer: Self-pay | Admitting: *Deleted

## 2012-02-03 MED ORDER — DIGOXIN 250 MCG PO TABS: 0.2500 mg | ORAL_TABLET | Freq: Every day | ORAL | Status: DC

## 2012-02-03 NOTE — Telephone Encounter (Signed)
Refilled Digoxin.

## 2012-02-08 ENCOUNTER — Ambulatory Visit (INDEPENDENT_AMBULATORY_CARE_PROVIDER_SITE_OTHER): Payer: BC Managed Care – PPO

## 2012-02-08 DIAGNOSIS — Z7901 Long term (current) use of anticoagulants: Secondary | ICD-10-CM

## 2012-02-08 DIAGNOSIS — I4891 Unspecified atrial fibrillation: Secondary | ICD-10-CM

## 2012-02-16 ENCOUNTER — Encounter: Payer: Self-pay | Admitting: Cardiovascular Disease

## 2012-02-16 ENCOUNTER — Ambulatory Visit (INDEPENDENT_AMBULATORY_CARE_PROVIDER_SITE_OTHER): Payer: BC Managed Care – PPO | Admitting: Cardiovascular Disease

## 2012-02-16 VITALS — BP 110/72 | HR 88 | Ht 71.0 in | Wt 292.2 lb

## 2012-02-16 DIAGNOSIS — I2510 Atherosclerotic heart disease of native coronary artery without angina pectoris: Secondary | ICD-10-CM

## 2012-02-16 DIAGNOSIS — R0602 Shortness of breath: Secondary | ICD-10-CM

## 2012-02-16 DIAGNOSIS — R609 Edema, unspecified: Secondary | ICD-10-CM

## 2012-02-16 DIAGNOSIS — I4891 Unspecified atrial fibrillation: Secondary | ICD-10-CM

## 2012-02-16 DIAGNOSIS — E785 Hyperlipidemia, unspecified: Secondary | ICD-10-CM

## 2012-02-16 NOTE — Assessment & Plan Note (Signed)
Currently with no symptoms of angina. No further workup at this time. Continue current medication regimen. 

## 2012-02-16 NOTE — Assessment & Plan Note (Signed)
Colton Lamb held secondary to insurance issues. Continue Lipitor.

## 2012-02-16 NOTE — Progress Notes (Signed)
Patient ID: Colton Lamb, male    DOB: 05-22-1951, 61 y.o.   MRN: [id number]  HPI Comments: Colton Lamb is a 61 year old gentleman with a history of paroxysmal atrial fibrillation, last epsiode in 2008 per the patient,  hyperlipidemia, coronary artery disease, PCI x3 to the LAD, circumflex, PDA and PL branch, ejection fraction 45-50% with anterior hypokinesis, last catheterization in 2008 with admission for unstable angina found to have in-stent restenosis in the RCA and had treatment with a cypher stent to the OM who presents for routine followup.He has sleep apnea, has not been using CPAP.    He reports that his wife passed away several weeks ago and since then he has been eating more, drinking more water. He has had more lower extremity swelling, mild shortness of breath. His weight is up 9 pounds from his last clinic visit. Previous blood work earlier in the year showed glucose elevated consistent with prediabetes. He has not been using his CPAP and recently changed to a new mask and will try this soon. Colton Lamb was previously held secondary to insurance issues.   cardiac catheterization February 2008 showed patent LAD stent proximally, large codominant circumflex with patent stent, RCA with 40-50% stenosis in the distal right, 80% instant restenosis in the mid PDA in the setting of a prior cutting balloon atherectomy. A 2.5 x 13 mm Cypher stent was placed.   Last stress test February 2010 showing moderate to severe perfusion defect in the mid anteroseptal, apical anterior, apical septal, apical and septal regions consistent with infarct or scar. Also in the inferior wall. No significant ischemia. Ejection fraction 54%.     Echocardiogram March 2008 shows ejection fraction 45-55%, mild to moderately dilated, mid to distal anterior septal wall with severe hypokinesis, mild LVH   EKG shows normal sinus rhythm with rate 81 beats per minute, poor R-wave progression, unable to rule out anterior infarct,  left axis deviation   Outpatient Encounter Prescriptions as of 02/16/2012  Medication Sig Dispense Refill  . aspirin 81 MG tablet Take 81 mg by mouth daily.        Marland Kitchen atorvastatin (LIPITOR) 80 MG tablet Take 1 tablet (80 mg total) by mouth daily.  30 tablet  6  . clopidogrel (PLAVIX) 75 MG tablet Take 1 tablet (75 mg total) by mouth daily.  30 tablet  6  . COREG CR 80 MG 24 hr capsule TAKE ONE CAPSULE BY MOUTH EVERY DAY  30 capsule  1  . digoxin (LANOXIN) 0.25 MG tablet Take 1 tablet (0.25 mg total) by mouth daily.  30 tablet  6  . furosemide (LASIX) 20 MG tablet TAKE 1 TABLET BY MOUTH ONCE A DAY  30 tablet  6  . isosorbide mononitrate (IMDUR) 30 MG 24 hr tablet Take 1 tablet (30 mg total) by mouth daily.  30 tablet  6  . lisinopril (PRINIVIL,ZESTRIL) 20 MG tablet Take 1 tablet (20 mg total) by mouth daily.  30 tablet  6  . Mesalamine (ASACOL HD) 800 MG TBEC Take 1 tablet (800 mg total) by mouth 3 (three) times daily.  90 tablet  11  . NIASPAN 1000 MG CR tablet TAKE 1 TABLET BY MOUTH AT BEDTIME  30 tablet  6  . nitroGLYCERIN (NITROSTAT) 0.4 MG SL tablet Place 0.4 mg under the tongue every 5 (five) minutes as needed.        Marland Kitchen omeprazole (PRILOSEC) 20 MG capsule Take 20 mg by mouth daily.        Marland Kitchen  warfarin (COUMADIN) 3 MG tablet Take 1 tablet (3 mg total) by mouth as directed.  50 tablet  6  . DISCONTD: ezetimibe (ZETIA) 10 MG tablet Take 1 tablet (10 mg total) by mouth daily.  30 tablet  6    Review of Systems  Constitutional: Positive for unexpected weight change.  HENT: Negative.   Eyes: Negative.   Respiratory: Negative.   Cardiovascular: Positive for leg swelling.  Gastrointestinal: Negative.   Musculoskeletal: Negative.   Skin: Negative.   Neurological: Negative.   Hematological: Negative.   Psychiatric/Behavioral: Negative.   All other systems reviewed and are negative.    BP 110/72  Pulse 88  Ht 5' 11"  (1.803 m)  Wt 292 lb 4 oz (132.564 kg)  BMI 40.76 kg/m2  Physical  Exam  Nursing note and vitals reviewed. Constitutional: He is oriented to person, place, and time. He appears well-developed and well-nourished.  HENT:  Head: Normocephalic.  Nose: Nose normal.  Mouth/Throat: Oropharynx is clear and moist.  Eyes: Conjunctivae are normal. Pupils are equal, round, and reactive to light.  Neck: Normal range of motion. Neck supple. No JVD present.  Cardiovascular: Normal rate, regular rhythm, S1 normal, S2 normal, normal heart sounds and intact distal pulses.  Exam reveals no gallop and no friction rub.   No murmur heard.      1+ pitting edema to the mid shins  Pulmonary/Chest: Effort normal and breath sounds normal. No respiratory distress. He has no wheezes. He has no rales. He exhibits no tenderness.  Abdominal: Soft. Bowel sounds are normal. He exhibits no distension. There is no tenderness.  Musculoskeletal: Normal range of motion. He exhibits edema. He exhibits no tenderness.  Lymphadenopathy:    He has no cervical adenopathy.  Neurological: He is alert and oriented to person, place, and time. Coordination normal.  Skin: Skin is warm and dry. No rash noted. No erythema.  Psychiatric: He has a normal mood and affect. His behavior is normal. Judgment and thought content normal.           Assessment and Plan

## 2012-02-16 NOTE — Patient Instructions (Addendum)
You are doing well. No medication changes were made.  Try to lose weight, watch your diet Take extra lasix (2 pills in the AM) as needed for leg swelling  We will check blood work today  Please call us if you have new issues that need to be addressed before your next appt.  Your physician wants you to follow-up in: 6 months.  You will receive a reminder letter in the mail two months in advance. If you don't receive a letter, please call our office to schedule the follow-up appointment.

## 2012-02-16 NOTE — Assessment & Plan Note (Signed)
We have recommended he decrease his fluid intake, and suggested he take Lasix 40 mg as needed for worsening edema. We'll check a basic metabolic panel to evaluate his potassium.

## 2012-02-16 NOTE — Assessment & Plan Note (Signed)
Maintaining normal sinus rhythm. No changes to his medications.

## 2012-02-17 ENCOUNTER — Other Ambulatory Visit: Payer: Self-pay | Admitting: Cardiovascular Disease

## 2012-02-17 NOTE — Telephone Encounter (Signed)
Refilled Clopidogrel.

## 2012-02-29 ENCOUNTER — Other Ambulatory Visit: Payer: Self-pay | Admitting: Cardiovascular Disease

## 2012-03-05 ENCOUNTER — Other Ambulatory Visit: Payer: Self-pay | Admitting: Cardiovascular Disease

## 2012-03-05 NOTE — Telephone Encounter (Signed)
Refilled Coreg.

## 2012-03-07 ENCOUNTER — Ambulatory Visit (INDEPENDENT_AMBULATORY_CARE_PROVIDER_SITE_OTHER): Payer: BC Managed Care – PPO

## 2012-03-07 DIAGNOSIS — Z7901 Long term (current) use of anticoagulants: Secondary | ICD-10-CM

## 2012-03-07 DIAGNOSIS — I4891 Unspecified atrial fibrillation: Secondary | ICD-10-CM

## 2012-03-07 LAB — POCT INR: INR: 1.9

## 2012-03-19 ENCOUNTER — Other Ambulatory Visit: Payer: Self-pay | Admitting: Cardiovascular Disease

## 2012-03-19 NOTE — Telephone Encounter (Signed)
Refilled Isosorbide.

## 2012-03-28 ENCOUNTER — Ambulatory Visit (INDEPENDENT_AMBULATORY_CARE_PROVIDER_SITE_OTHER): Payer: BC Managed Care – PPO

## 2012-03-28 DIAGNOSIS — I4891 Unspecified atrial fibrillation: Secondary | ICD-10-CM

## 2012-03-28 DIAGNOSIS — Z7901 Long term (current) use of anticoagulants: Secondary | ICD-10-CM

## 2012-04-17 ENCOUNTER — Other Ambulatory Visit: Payer: Self-pay

## 2012-04-17 MED ORDER — ATORVASTATIN CALCIUM 80 MG PO TABS: 80.0000 mg | ORAL_TABLET | Freq: Every day | ORAL | Status: DC

## 2012-04-17 NOTE — Telephone Encounter (Signed)
Refill sent for atorvastatin.

## 2012-04-25 ENCOUNTER — Ambulatory Visit (INDEPENDENT_AMBULATORY_CARE_PROVIDER_SITE_OTHER): Payer: BC Managed Care – PPO

## 2012-04-25 DIAGNOSIS — I4891 Unspecified atrial fibrillation: Secondary | ICD-10-CM

## 2012-04-25 DIAGNOSIS — Z7901 Long term (current) use of anticoagulants: Secondary | ICD-10-CM

## 2012-04-27 ENCOUNTER — Other Ambulatory Visit: Payer: Self-pay

## 2012-04-27 MED ORDER — WARFARIN SODIUM 3 MG PO TABS: 3.0000 mg | ORAL_TABLET | ORAL | Status: DC

## 2012-05-23 ENCOUNTER — Ambulatory Visit (INDEPENDENT_AMBULATORY_CARE_PROVIDER_SITE_OTHER): Payer: BC Managed Care – PPO

## 2012-05-23 DIAGNOSIS — I4891 Unspecified atrial fibrillation: Secondary | ICD-10-CM

## 2012-05-23 DIAGNOSIS — Z7901 Long term (current) use of anticoagulants: Secondary | ICD-10-CM

## 2012-05-23 LAB — POCT INR: INR: 1.4

## 2012-06-06 ENCOUNTER — Ambulatory Visit (INDEPENDENT_AMBULATORY_CARE_PROVIDER_SITE_OTHER): Payer: BC Managed Care – PPO

## 2012-06-06 DIAGNOSIS — I4891 Unspecified atrial fibrillation: Secondary | ICD-10-CM

## 2012-06-06 DIAGNOSIS — Z7901 Long term (current) use of anticoagulants: Secondary | ICD-10-CM

## 2012-06-07 ENCOUNTER — Other Ambulatory Visit: Payer: Self-pay | Admitting: *Deleted

## 2012-06-07 MED ORDER — LISINOPRIL 20 MG PO TABS: 20.0000 mg | ORAL_TABLET | Freq: Every day | ORAL | Status: DC

## 2012-06-07 NOTE — Telephone Encounter (Signed)
Refilled Lisinopril. 

## 2012-06-20 ENCOUNTER — Ambulatory Visit (INDEPENDENT_AMBULATORY_CARE_PROVIDER_SITE_OTHER): Payer: BC Managed Care – PPO

## 2012-06-20 DIAGNOSIS — I4891 Unspecified atrial fibrillation: Secondary | ICD-10-CM

## 2012-06-20 DIAGNOSIS — Z7901 Long term (current) use of anticoagulants: Secondary | ICD-10-CM

## 2012-06-20 LAB — POCT INR: INR: 2.5

## 2012-07-11 ENCOUNTER — Ambulatory Visit (INDEPENDENT_AMBULATORY_CARE_PROVIDER_SITE_OTHER): Payer: BC Managed Care – PPO

## 2012-07-11 DIAGNOSIS — I4891 Unspecified atrial fibrillation: Secondary | ICD-10-CM

## 2012-07-11 DIAGNOSIS — Z7901 Long term (current) use of anticoagulants: Secondary | ICD-10-CM

## 2012-07-11 LAB — POCT INR: INR: 2.3

## 2012-08-08 ENCOUNTER — Ambulatory Visit (INDEPENDENT_AMBULATORY_CARE_PROVIDER_SITE_OTHER): Payer: BC Managed Care – PPO

## 2012-08-08 DIAGNOSIS — I4891 Unspecified atrial fibrillation: Secondary | ICD-10-CM

## 2012-08-08 DIAGNOSIS — Z7901 Long term (current) use of anticoagulants: Secondary | ICD-10-CM

## 2012-08-10 ENCOUNTER — Ambulatory Visit (INDEPENDENT_AMBULATORY_CARE_PROVIDER_SITE_OTHER): Payer: BC Managed Care – PPO | Admitting: Gastroenterology

## 2012-08-10 ENCOUNTER — Encounter: Payer: Self-pay | Admitting: Gastroenterology

## 2012-08-10 VITALS — BP 106/68 | HR 76 | Ht 71.0 in | Wt 305.6 lb

## 2012-08-10 DIAGNOSIS — Z86010 Personal history of colonic polyps: Secondary | ICD-10-CM

## 2012-08-10 DIAGNOSIS — K219 Gastro-esophageal reflux disease without esophagitis: Secondary | ICD-10-CM

## 2012-08-10 DIAGNOSIS — K5080 Crohn's disease of both small and large intestine without complications: Secondary | ICD-10-CM

## 2012-08-10 MED ORDER — MESALAMINE 800 MG PO TBEC: 1.0000 | DELAYED_RELEASE_TABLET | Freq: Three times a day (TID) | ORAL | Status: DC

## 2012-08-10 NOTE — Patient Instructions (Addendum)
We have sent the following medications to your pharmacy for you to pick up at your convenience: Asacol.

## 2012-08-10 NOTE — Progress Notes (Signed)
History of Present Illness: This is a 62 year old male with Crohn's ileocolitis. He has had no gastrointestinal complaints for months. His reflux symptoms are under good control. He relates that his wife passed away at metastatic cancer this past July had a difficult several months for him since that time. Denies weight loss, abdominal pain, constipation, diarrhea, change in stool caliber, melena, hematochezia, nausea, vomiting, dysphagia, reflux symptoms, chest pain.  Current Medications, Allergies, Past Medical History, Past Surgical History, Family History and Social History were reviewed in [employer] record.  Physical Exam: General: Well developed , well nourished, no acute distress Head: Normocephalic and atraumatic Eyes:  sclerae anicteric, EOMI Ears: Normal auditory acuity Mouth: No deformity or lesions Lungs: Clear throughout to auscultation Heart: Regular rate and rhythm; no murmurs, rubs or bruits Abdomen: Soft, non tender and non distended. No masses, hepatosplenomegaly or hernias noted. Normal Bowel sounds Musculoskeletal: Symmetrical with no gross deformities  Pulses:  Normal pulses noted Extremities: No clubbing, cyanosis, edema or deformities noted Neurological: Alert oriented x 4, grossly nonfocal Psychological:  Alert and cooperative. Normal mood and affect  Assessment and Recommendations:  1. Crohn's ileocolitis. Stable. Refill Asacol HD 800 mg 3 times a day. Obtain CBC, CMET and B12 at his next office visit.  2. Personal history of adenomatous colon polyps. Surveillance colonoscopy May 2015.   3. GERD. Continue standard antireflux measures and omeprazole 20 mg daily.

## 2012-08-16 ENCOUNTER — Other Ambulatory Visit: Payer: Self-pay | Admitting: Cardiovascular Disease

## 2012-08-16 NOTE — Telephone Encounter (Signed)
Refilled Niacin.

## 2012-08-25 ENCOUNTER — Other Ambulatory Visit: Payer: Self-pay | Admitting: Cardiovascular Disease

## 2012-08-27 ENCOUNTER — Other Ambulatory Visit: Payer: Self-pay | Admitting: *Deleted

## 2012-08-27 MED ORDER — DIGOXIN 250 MCG PO TABS: 0.2500 mg | ORAL_TABLET | Freq: Every day | ORAL | Status: DC

## 2012-08-27 NOTE — Telephone Encounter (Signed)
Please review and refill, Thank You.

## 2012-08-27 NOTE — Telephone Encounter (Signed)
Refilled Digoxin.

## 2012-09-05 ENCOUNTER — Ambulatory Visit (INDEPENDENT_AMBULATORY_CARE_PROVIDER_SITE_OTHER): Payer: BC Managed Care – PPO | Admitting: *Deleted

## 2012-09-05 DIAGNOSIS — Z7901 Long term (current) use of anticoagulants: Secondary | ICD-10-CM

## 2012-09-05 DIAGNOSIS — I4891 Unspecified atrial fibrillation: Secondary | ICD-10-CM

## 2012-09-09 ENCOUNTER — Other Ambulatory Visit: Payer: Self-pay | Admitting: Cardiovascular Disease

## 2012-09-17 ENCOUNTER — Ambulatory Visit: Payer: BC Managed Care – PPO | Admitting: Cardiovascular Disease

## 2012-09-24 ENCOUNTER — Encounter: Payer: Self-pay | Admitting: *Deleted

## 2012-09-26 ENCOUNTER — Ambulatory Visit (INDEPENDENT_AMBULATORY_CARE_PROVIDER_SITE_OTHER): Payer: BC Managed Care – PPO

## 2012-09-26 DIAGNOSIS — I4891 Unspecified atrial fibrillation: Secondary | ICD-10-CM

## 2012-09-26 DIAGNOSIS — Z7901 Long term (current) use of anticoagulants: Secondary | ICD-10-CM

## 2012-09-26 LAB — POCT INR: INR: 3.2

## 2012-09-28 ENCOUNTER — Other Ambulatory Visit: Payer: Self-pay | Admitting: Cardiovascular Disease

## 2012-09-28 NOTE — Telephone Encounter (Signed)
Refilled Atorvasatin sent to [facility].

## 2012-10-17 ENCOUNTER — Ambulatory Visit (INDEPENDENT_AMBULATORY_CARE_PROVIDER_SITE_OTHER): Payer: BC Managed Care – PPO

## 2012-10-17 DIAGNOSIS — I4891 Unspecified atrial fibrillation: Secondary | ICD-10-CM

## 2012-10-17 DIAGNOSIS — Z7901 Long term (current) use of anticoagulants: Secondary | ICD-10-CM

## 2012-10-25 ENCOUNTER — Ambulatory Visit (INDEPENDENT_AMBULATORY_CARE_PROVIDER_SITE_OTHER): Payer: BC Managed Care – PPO | Admitting: Cardiovascular Disease

## 2012-10-25 ENCOUNTER — Encounter: Payer: Self-pay | Admitting: Cardiovascular Disease

## 2012-10-25 VITALS — BP 138/90 | HR 79 | Resp 16 | Ht 71.0 in | Wt 301.5 lb

## 2012-10-25 DIAGNOSIS — I4891 Unspecified atrial fibrillation: Secondary | ICD-10-CM

## 2012-10-25 DIAGNOSIS — E785 Hyperlipidemia, unspecified: Secondary | ICD-10-CM

## 2012-10-25 DIAGNOSIS — R609 Edema, unspecified: Secondary | ICD-10-CM

## 2012-10-25 DIAGNOSIS — Z79899 Other long term (current) drug therapy: Secondary | ICD-10-CM

## 2012-10-25 DIAGNOSIS — I2510 Atherosclerotic heart disease of native coronary artery without angina pectoris: Secondary | ICD-10-CM

## 2012-10-25 NOTE — Assessment & Plan Note (Signed)
Cholesterol is at goal on the current lipid regimen. No changes to the medications were made.  

## 2012-10-25 NOTE — Progress Notes (Signed)
Patient ID: Colton Lamb, male    DOB: 07/18/1951, 62 y.o.   MRN: [id number]  HPI Comments: Colton Lamb is a 62 year old gentleman with a history of paroxysmal atrial fibrillation, last episode in October 14, 2006 per the patient,  hyperlipidemia, coronary artery disease, PCI x3 to the LAD, circumflex, PDA and PL branch, ejection fraction 45-50% with anterior hypokinesis, last catheterization in 2006-10-14 with admission for unstable angina found to have in-stent restenosis of the little the RCA and had treatment with a cypher stent to the OM who presents for routine followup.He has sleep apnea, has not been using CPAP.    He reports that his wife passed away in 2011-10-15 and since then he has been eating more, drinking more water. On his last clinic visit, he has significant weight gain and lower stone edema. We started Lasix 40 mg in the morning on a daily basis..  Since that time, he reports his breathing has improved, edema has resolved. Overall he feels well. Weight continues to trend upward despite diuresis.  Previous blood work earlier in the year showed glucose elevated consistent with prediabetes.   cardiac catheterization 10/14/2006 showed patent LAD stent proximally, large codominant circumflex with patent stent, RCA with 40-50% stenosis in the distal right, 80% instant restenosis in the mid PDA in the setting of a prior cutting balloon atherectomy. A 2.5 x 13 mm Cypher stent was placed.   Last stress test 10-14-08 showing moderate to severe perfusion defect in the mid anteroseptal, apical anterior, apical septal, apical and septal regions consistent with infarct or scar. Also in the inferior wall. No significant ischemia. Ejection fraction 54%.     Echocardiogram March 2008 shows ejection fraction 45-55%, mild to moderately dilated, mid to distal anterior septal wall with severe hypokinesis, mild LVH   EKG shows normal sinus rhythm with rate 79 beats per minute, poor R-wave progression, unable to  rule out anterior infarct, left axis deviation, nonspecific ST abnormality in the anterolateral leads   Outpatient Encounter Prescriptions as of 10/25/2012  Medication Sig Dispense Refill  . aspirin 81 MG tablet Take 81 mg by mouth daily.        Marland Kitchen atorvastatin (LIPITOR) 80 MG tablet TAKE 1 TABLET BY MOUTH EVERY DAY  30 tablet  3  . clopidogrel (PLAVIX) 75 MG tablet TAKE 1 TABLET (75 MG TOTAL) BY MOUTH DAILY.  30 tablet  6  . COREG CR 80 MG 24 hr capsule TAKE ONE CAPSULE BY MOUTH EVERY DAY  30 capsule  5  . digoxin (LANOXIN) 0.25 MG tablet Take 1 tablet (0.25 mg total) by mouth daily.  30 tablet  3  . furosemide (LASIX) 20 MG tablet Take 40 mg by mouth daily.       . isosorbide mononitrate (IMDUR) 30 MG 24 hr tablet TAKE 1 TABLET (30 MG TOTAL) BY MOUTH DAILY.  30 tablet  6  . lisinopril (PRINIVIL,ZESTRIL) 20 MG tablet Take 1 tablet (20 mg total) by mouth daily.  30 tablet  6  . Mesalamine (ASACOL HD) 800 MG TBEC Take 1 tablet (800 mg total) by mouth 3 (three) times daily.  90 tablet  11  . niacin (NIASPAN) 1000 MG CR tablet TAKE 1 TABLET BY MOUTH AT BEDTIME  30 tablet  6  . nitroGLYCERIN (NITROSTAT) 0.4 MG SL tablet Place 0.4 mg under the tongue every 5 (five) minutes as needed.        Marland Kitchen omeprazole (PRILOSEC) 20 MG capsule Take 20 mg by mouth  daily.        . warfarin (COUMADIN) 3 MG tablet TAKE 1 TABLET (3 MG TOTAL) BY MOUTH AS DIRECTED.  50 tablet  3   Review of Systems  HENT: Negative.   Eyes: Negative.   Respiratory: Negative.   Gastrointestinal: Negative.   Musculoskeletal: Negative.   Skin: Negative.   Neurological: Negative.   Psychiatric/Behavioral: Negative.   All other systems reviewed and are negative.    BP 138/90  Pulse 79  Resp 16  Ht 5' 11"  (1.803 m)  Wt 301 lb 8 oz (136.76 kg)  BMI 42.07 kg/m2  Physical Exam  Nursing note and vitals reviewed. Constitutional: He is oriented to person, place, and time. He appears well-developed and well-nourished.  obese  HENT:   Head: Normocephalic.  Nose: Nose normal.  Mouth/Throat: Oropharynx is clear and moist.  Eyes: Conjunctivae are normal. Pupils are equal, round, and reactive to light.  Neck: Normal range of motion. Neck supple. No JVD present.  Cardiovascular: Normal rate, regular rhythm, S1 normal, S2 normal, normal heart sounds and intact distal pulses.  Exam reveals no gallop and no friction rub.   No murmur heard. Pulmonary/Chest: Effort normal and breath sounds normal. No respiratory distress. He has no wheezes. He has no rales. He exhibits no tenderness.  Abdominal: Soft. Bowel sounds are normal. He exhibits no distension. There is no tenderness.  Musculoskeletal: Normal range of motion. He exhibits no edema and no tenderness.  Lymphadenopathy:    He has no cervical adenopathy.  Neurological: He is alert and oriented to person, place, and time. Coordination normal.  Skin: Skin is warm and dry. No rash noted. No erythema.  Psychiatric: He has a normal mood and affect. His behavior is normal. Judgment and thought content normal.      Assessment and Plan

## 2012-10-25 NOTE — Patient Instructions (Addendum)
You are doing well. No medication changes were made.  We will check labs today to check kidneys and potassium level, digoxin  Please call us if you have new issues that need to be addressed before your next appt.  Your physician wants you to follow-up in: 6 months.  You will receive a reminder letter in the mail two months in advance. If you don't receive a letter, please call our office to schedule the follow-up appointment.

## 2012-10-25 NOTE — Assessment & Plan Note (Signed)
Maintaining normal sinus rhythm. No changes to his medications

## 2012-10-25 NOTE — Assessment & Plan Note (Signed)
Currently with no symptoms of angina. No further workup at this time. Continue current medication regimen. 

## 2012-10-25 NOTE — Assessment & Plan Note (Signed)
Previous edema likely from diastolic CHF. This has resolved. We will check basic metabolic panel today to help guide Lasix dosing

## 2012-10-26 ENCOUNTER — Other Ambulatory Visit: Payer: Self-pay | Admitting: Cardiovascular Disease

## 2012-10-26 LAB — BASIC METABOLIC PANEL
BUN/Creatinine Ratio: 10 (ref 10–22)
Creatinine, Ser: 0.84 mg/dL (ref 0.76–1.27)
GFR calc non Af Amer: 95 mL/min/{1.73_m2} (ref >59)
Sodium: 138 mmol/L (ref 134–144)

## 2012-10-26 NOTE — Telephone Encounter (Signed)
Refilled Furosemide sent to [facility].

## 2012-11-14 ENCOUNTER — Ambulatory Visit (INDEPENDENT_AMBULATORY_CARE_PROVIDER_SITE_OTHER): Payer: BC Managed Care – PPO

## 2012-11-14 ENCOUNTER — Other Ambulatory Visit: Payer: Self-pay | Admitting: Cardiovascular Disease

## 2012-11-14 DIAGNOSIS — I4891 Unspecified atrial fibrillation: Secondary | ICD-10-CM

## 2012-11-14 DIAGNOSIS — Z7901 Long term (current) use of anticoagulants: Secondary | ICD-10-CM

## 2012-11-14 NOTE — Telephone Encounter (Signed)
Refilled [facility]orbide to [facility].

## 2012-12-12 ENCOUNTER — Ambulatory Visit (INDEPENDENT_AMBULATORY_CARE_PROVIDER_SITE_OTHER): Payer: BC Managed Care – PPO

## 2012-12-12 DIAGNOSIS — I4891 Unspecified atrial fibrillation: Secondary | ICD-10-CM

## 2012-12-12 DIAGNOSIS — Z7901 Long term (current) use of anticoagulants: Secondary | ICD-10-CM

## 2013-01-09 ENCOUNTER — Ambulatory Visit (INDEPENDENT_AMBULATORY_CARE_PROVIDER_SITE_OTHER): Payer: BC Managed Care – PPO

## 2013-01-09 DIAGNOSIS — Z7901 Long term (current) use of anticoagulants: Secondary | ICD-10-CM

## 2013-01-09 DIAGNOSIS — I4891 Unspecified atrial fibrillation: Secondary | ICD-10-CM

## 2013-01-09 LAB — POCT INR: INR: 1.6

## 2013-01-23 ENCOUNTER — Ambulatory Visit (INDEPENDENT_AMBULATORY_CARE_PROVIDER_SITE_OTHER): Payer: BC Managed Care – PPO

## 2013-01-23 DIAGNOSIS — Z7901 Long term (current) use of anticoagulants: Secondary | ICD-10-CM

## 2013-01-23 DIAGNOSIS — I4891 Unspecified atrial fibrillation: Secondary | ICD-10-CM

## 2013-01-23 LAB — POCT INR: INR: 1.3

## 2013-01-23 MED ORDER — WARFARIN SODIUM 3 MG PO TABS: ORAL_TABLET | ORAL | Status: DC

## 2013-02-02 ENCOUNTER — Other Ambulatory Visit: Payer: Self-pay | Admitting: Cardiovascular Disease

## 2013-02-04 ENCOUNTER — Other Ambulatory Visit: Payer: Self-pay | Admitting: *Deleted

## 2013-02-04 ENCOUNTER — Other Ambulatory Visit: Payer: Self-pay | Admitting: Cardiovascular Disease

## 2013-02-04 MED ORDER — LISINOPRIL 20 MG PO TABS: 20.0000 mg | ORAL_TABLET | Freq: Every day | ORAL | Status: DC

## 2013-02-04 MED ORDER — ATORVASTATIN CALCIUM 80 MG PO TABS: ORAL_TABLET | ORAL | Status: DC

## 2013-02-04 NOTE — Telephone Encounter (Signed)
Refilled Lisinopril and atorvastatin sent to [facility].

## 2013-02-06 ENCOUNTER — Ambulatory Visit (INDEPENDENT_AMBULATORY_CARE_PROVIDER_SITE_OTHER): Payer: BC Managed Care – PPO

## 2013-02-06 DIAGNOSIS — I4891 Unspecified atrial fibrillation: Secondary | ICD-10-CM

## 2013-02-06 DIAGNOSIS — Z7901 Long term (current) use of anticoagulants: Secondary | ICD-10-CM

## 2013-03-13 ENCOUNTER — Other Ambulatory Visit: Payer: Self-pay | Admitting: Cardiovascular Disease

## 2013-03-13 NOTE — Telephone Encounter (Signed)
Refilled [facility]urosemide sent to [facility].

## 2013-04-10 ENCOUNTER — Ambulatory Visit (INDEPENDENT_AMBULATORY_CARE_PROVIDER_SITE_OTHER): Payer: BC Managed Care – PPO

## 2013-04-10 DIAGNOSIS — Z7901 Long term (current) use of anticoagulants: Secondary | ICD-10-CM

## 2013-04-10 DIAGNOSIS — I4891 Unspecified atrial fibrillation: Secondary | ICD-10-CM

## 2013-04-14 ENCOUNTER — Other Ambulatory Visit: Payer: Self-pay | Admitting: Cardiovascular Disease

## 2013-04-15 ENCOUNTER — Other Ambulatory Visit: Payer: Self-pay | Admitting: *Deleted

## 2013-04-15 ENCOUNTER — Telehealth: Payer: Self-pay | Admitting: *Deleted

## 2013-04-15 MED ORDER — ATORVASTATIN CALCIUM 80 MG PO TABS: ORAL_TABLET | ORAL | Status: DC

## 2013-04-15 MED ORDER — FUROSEMIDE 20 MG PO TABS: ORAL_TABLET | ORAL | Status: DC

## 2013-04-15 MED ORDER — WARFARIN SODIUM 3 MG PO TABS: ORAL_TABLET | ORAL | Status: DC

## 2013-04-15 MED ORDER — LISINOPRIL 20 MG PO TABS: 20.0000 mg | ORAL_TABLET | Freq: Every day | ORAL | Status: DC

## 2013-04-15 NOTE — Telephone Encounter (Signed)
Received pharmacy request refill for Warfarin. Please review and refill. Thank You.

## 2013-04-15 NOTE — Telephone Encounter (Signed)
Refilled Lisinopril, Atorvastatin and Furosemide sent to [facility].

## 2013-04-16 NOTE — Telephone Encounter (Signed)
Medications refilled yesterday.

## 2013-05-08 ENCOUNTER — Ambulatory Visit (INDEPENDENT_AMBULATORY_CARE_PROVIDER_SITE_OTHER): Payer: BC Managed Care – PPO | Admitting: General Practice

## 2013-05-08 DIAGNOSIS — Z7901 Long term (current) use of anticoagulants: Secondary | ICD-10-CM

## 2013-05-08 DIAGNOSIS — I4891 Unspecified atrial fibrillation: Secondary | ICD-10-CM

## 2013-05-08 LAB — POCT INR: INR: 1.8

## 2013-05-10 ENCOUNTER — Ambulatory Visit: Payer: BC Managed Care – PPO | Admitting: Cardiovascular Disease

## 2013-05-10 ENCOUNTER — Encounter: Payer: Self-pay | Admitting: *Deleted

## 2013-05-21 ENCOUNTER — Other Ambulatory Visit: Payer: Self-pay | Admitting: Cardiovascular Disease

## 2013-05-21 ENCOUNTER — Other Ambulatory Visit: Payer: Self-pay | Admitting: *Deleted

## 2013-05-21 MED ORDER — DIGOXIN 250 MCG PO TABS: 0.2500 mg | ORAL_TABLET | Freq: Every day | ORAL | Status: DC

## 2013-05-21 NOTE — Telephone Encounter (Signed)
Requested Prescriptions   Signed Prescriptions Disp Refills  . digoxin (LANOXIN) 0.25 MG tablet 30 tablet 1    Sig: Take 1 tablet (0.25 mg total) by mouth daily.    Authorizing Provider: Minna Merritts    Ordering User: Britt Bottom

## 2013-05-29 ENCOUNTER — Ambulatory Visit (INDEPENDENT_AMBULATORY_CARE_PROVIDER_SITE_OTHER): Payer: BC Managed Care – PPO | Admitting: General Practice

## 2013-05-29 DIAGNOSIS — I4891 Unspecified atrial fibrillation: Secondary | ICD-10-CM

## 2013-05-29 DIAGNOSIS — Z7901 Long term (current) use of anticoagulants: Secondary | ICD-10-CM

## 2013-05-29 LAB — POCT INR: INR: 2.1

## 2013-06-17 ENCOUNTER — Other Ambulatory Visit: Payer: Self-pay | Admitting: *Deleted

## 2013-06-17 ENCOUNTER — Other Ambulatory Visit: Payer: Self-pay | Admitting: Cardiovascular Disease

## 2013-06-17 MED ORDER — CARVEDILOL PHOSPHATE ER 80 MG PO CP24: ORAL_CAPSULE | ORAL | Status: DC

## 2013-06-17 NOTE — Telephone Encounter (Signed)
Lmtcb Pt needs to schedule future appoinment with Dr. Rockey Situ overdue 6 month f/u.   Requested Prescriptions   Signed Prescriptions Disp Refills  . carvedilol (COREG CR) 80 MG 24 hr capsule 30 capsule 1    Sig: TAKE ONE CAPSULE BY MOUTH EVERY DAY    Authorizing Provider: Minna Merritts    Ordering User: Britt Bottom

## 2013-06-26 ENCOUNTER — Ambulatory Visit (INDEPENDENT_AMBULATORY_CARE_PROVIDER_SITE_OTHER): Payer: BC Managed Care – PPO | Admitting: General Practice

## 2013-06-26 DIAGNOSIS — Z7901 Long term (current) use of anticoagulants: Secondary | ICD-10-CM

## 2013-06-26 DIAGNOSIS — I4891 Unspecified atrial fibrillation: Secondary | ICD-10-CM

## 2013-07-15 ENCOUNTER — Other Ambulatory Visit: Payer: Self-pay | Admitting: Cardiovascular Disease

## 2013-08-22 ENCOUNTER — Other Ambulatory Visit: Payer: Self-pay | Admitting: Cardiovascular Disease

## 2013-08-22 ENCOUNTER — Other Ambulatory Visit: Payer: Self-pay | Admitting: Gastroenterology

## 2013-10-22 ENCOUNTER — Encounter: Payer: Self-pay | Admitting: Gastroenterology

## 2013-10-30 HISTORY — PX: CARDIAC CATHETERIZATION: SHX172

## 2013-11-01 ENCOUNTER — Telehealth: Payer: Self-pay

## 2013-11-01 NOTE — Telephone Encounter (Signed)
Received notification from pt's pharmacy that pt has not had his INR checked in approximately 3 months. Left message for pt to call back.

## 2013-11-04 NOTE — Telephone Encounter (Signed)
Have attempted to call pt multiple times to rescheduled missed appointment.  Have refused refills at pharmacy and advised them pt needs an appointment for refills.  Mailed delinquent Coumadin letter to pt's home address.

## 2013-11-22 ENCOUNTER — Encounter (HOSPITAL_COMMUNITY): Payer: Self-pay | Admitting: Anesthesiology

## 2013-11-22 ENCOUNTER — Encounter (HOSPITAL_COMMUNITY): Payer: Self-pay

## 2013-11-22 ENCOUNTER — Inpatient Hospital Stay (HOSPITAL_COMMUNITY)
Admission: EM | Admit: 2013-11-22 | Discharge: 2013-11-30 | DRG: 280 | Disposition: A | Discharge status: Home / Self Care | Payer: BC Managed Care – PPO | Source: Other Acute Inpatient Hospital | Attending: Cardiology | Admitting: Cardiology

## 2013-11-22 ENCOUNTER — Encounter (HOSPITAL_COMMUNITY)
Admission: EM | Disposition: A | Discharge status: Home / Self Care | Payer: Self-pay | Source: Other Acute Inpatient Hospital | Attending: Cardiology

## 2013-11-22 ENCOUNTER — Emergency Department: Payer: Self-pay | Admitting: Emergency Medicine

## 2013-11-22 DIAGNOSIS — E785 Hyperlipidemia, unspecified: Secondary | ICD-10-CM

## 2013-11-22 DIAGNOSIS — I4891 Unspecified atrial fibrillation: Secondary | ICD-10-CM

## 2013-11-22 DIAGNOSIS — I4892 Unspecified atrial flutter: Secondary | ICD-10-CM

## 2013-11-22 DIAGNOSIS — Y849 Medical procedure, unspecified as the cause of abnormal reaction of the patient, or of later complication, without mention of misadventure at the time of the procedure: Secondary | ICD-10-CM | POA: Diagnosis present

## 2013-11-22 DIAGNOSIS — I2589 Other forms of chronic ischemic heart disease: Secondary | ICD-10-CM | POA: Diagnosis present

## 2013-11-22 DIAGNOSIS — R609 Edema, unspecified: Secondary | ICD-10-CM

## 2013-11-22 DIAGNOSIS — I10 Essential (primary) hypertension: Secondary | ICD-10-CM | POA: Diagnosis present

## 2013-11-22 DIAGNOSIS — I252 Old myocardial infarction: Secondary | ICD-10-CM

## 2013-11-22 DIAGNOSIS — I5041 Acute combined systolic (congestive) and diastolic (congestive) heart failure: Secondary | ICD-10-CM

## 2013-11-22 DIAGNOSIS — I509 Heart failure, unspecified: Secondary | ICD-10-CM | POA: Diagnosis present

## 2013-11-22 DIAGNOSIS — Z7901 Long term (current) use of anticoagulants: Secondary | ICD-10-CM

## 2013-11-22 DIAGNOSIS — I2510 Atherosclerotic heart disease of native coronary artery without angina pectoris: Secondary | ICD-10-CM

## 2013-11-22 DIAGNOSIS — T82897A Other specified complication of cardiac prosthetic devices, implants and grafts, initial encounter: Principal | ICD-10-CM | POA: Diagnosis present

## 2013-11-22 DIAGNOSIS — I255 Ischemic cardiomyopathy: Secondary | ICD-10-CM

## 2013-11-22 DIAGNOSIS — Z9189 Other specified personal risk factors, not elsewhere classified: Secondary | ICD-10-CM | POA: Diagnosis present

## 2013-11-22 DIAGNOSIS — Z9861 Coronary angioplasty status: Secondary | ICD-10-CM

## 2013-11-22 DIAGNOSIS — I214 Non-ST elevation (NSTEMI) myocardial infarction: Secondary | ICD-10-CM | POA: Diagnosis present

## 2013-11-22 DIAGNOSIS — I5043 Acute on chronic combined systolic (congestive) and diastolic (congestive) heart failure: Secondary | ICD-10-CM

## 2013-11-22 HISTORY — DX: Chronic systolic (congestive) heart failure: I50.22

## 2013-11-22 HISTORY — DX: Ischemic cardiomyopathy: I25.5

## 2013-11-22 HISTORY — DX: Unspecified atrial flutter: I48.92

## 2013-11-22 HISTORY — PX: LEFT HEART CATHETERIZATION WITH CORONARY ANGIOGRAM: SHX5451

## 2013-11-22 LAB — HEMOGLOBIN A1C
Hgb A1c MFr Bld: 7.9 % — ABNORMAL HIGH (ref <5.7)
Mean Plasma Glucose: 180 mg/dL — ABNORMAL HIGH (ref <117)

## 2013-11-22 LAB — PROTIME-INR
INR: 1.1
INR: 1.32 (ref 0.00–1.49)
PROTHROMBIN TIME: 14.1 s (ref 11.5–14.7)
Prothrombin Time: 16.1 seconds — ABNORMAL HIGH (ref 11.6–15.2)

## 2013-11-22 LAB — CBC
HCT: 43.3 % (ref 40.0–52.0)
HCT: 45.8 % (ref 39.0–52.0)
HGB: 13.9 g/dL (ref 13.0–18.0)
Hemoglobin: 15.4 g/dL (ref 13.0–17.0)
MCH: 26.2 pg (ref 26.0–34.0)
MCH: 27.3 pg (ref 26.0–34.0)
MCHC: 32.1 g/dL (ref 32.0–36.0)
MCHC: 33.6 g/dL (ref 30.0–36.0)
MCV: 81 fL (ref 80–100)
MCV: 81.1 fL (ref 78.0–100.0)
Platelet: 350 10*3/uL (ref 150–440)
Platelets: 269 10*3/uL (ref 150–400)
RBC: 5.32 10*6/uL (ref 4.40–5.90)
RBC: 5.65 MIL/uL (ref 4.22–5.81)
RDW: 16.1 % — ABNORMAL HIGH (ref 11.5–14.5)
RDW: 14.7 % (ref 11.5–15.5)
WBC: 19.7 10*3/uL — AB (ref 3.8–10.6)
WBC: 24 10*3/uL — ABNORMAL HIGH (ref 4.0–10.5)

## 2013-11-22 LAB — COMPREHENSIVE METABOLIC PANEL
ALT: 138 U/L — ABNORMAL HIGH (ref 0–53)
AST: 214 U/L — ABNORMAL HIGH (ref 0–37)
Albumin: 3.2 g/dL — ABNORMAL LOW (ref 3.5–5.2)
Alkaline Phosphatase: 191 U/L — ABNORMAL HIGH (ref 39–117)
BUN: 18 mg/dL (ref 6–23)
CO2: 17 mEq/L — ABNORMAL LOW (ref 19–32)
Calcium: 8.9 mg/dL (ref 8.4–10.5)
Chloride: 95 mEq/L — ABNORMAL LOW (ref 96–112)
Creatinine, Ser: 1.16 mg/dL (ref 0.50–1.35)
GFR calc Af Amer: 76 mL/min — ABNORMAL LOW (ref >90)
GFR calc non Af Amer: 66 mL/min — ABNORMAL LOW (ref >90)
Glucose, Bld: 308 mg/dL — ABNORMAL HIGH (ref 70–99)
Potassium: 4.5 mEq/L (ref 3.7–5.3)
Sodium: 135 mEq/L — ABNORMAL LOW (ref 137–147)
Total Bilirubin: 0.9 mg/dL (ref 0.3–1.2)
Total Protein: 7.6 g/dL (ref 6.0–8.3)

## 2013-11-22 LAB — MRSA PCR SCREENING: MRSA by PCR: NEGATIVE

## 2013-11-22 LAB — BASIC METABOLIC PANEL
Anion Gap: 12 (ref 7–16)
BUN: 16 mg/dL (ref 7–18)
CO2: 22 mmol/L (ref 21–32)
Calcium, Total: 9 mg/dL (ref 8.5–10.1)
Chloride: 99 mmol/L (ref 98–107)
Creatinine: 1.28 mg/dL (ref 0.60–1.30)
EGFR (African American): >60
EGFR (Non-African Amer.): 60 — ABNORMAL LOW
Glucose: 282 mg/dL — ABNORMAL HIGH (ref 65–99)
Osmolality: 278 (ref 275–301)
POTASSIUM: 3.7 mmol/L (ref 3.5–5.1)
SODIUM: 133 mmol/L — AB (ref 136–145)

## 2013-11-22 LAB — GLUCOSE, CAPILLARY
Glucose-Capillary: 297 mg/dL — ABNORMAL HIGH (ref 70–99)
Glucose-Capillary: 265 mg/dL — ABNORMAL HIGH (ref 70–99)
Glucose-Capillary: 239 mg/dL — ABNORMAL HIGH (ref 70–99)
Glucose-Capillary: 293 mg/dL — ABNORMAL HIGH (ref 70–99)

## 2013-11-22 LAB — APTT: aPTT: 31 seconds (ref 24–37)

## 2013-11-22 LAB — PRO B NATRIURETIC PEPTIDE: B-TYPE NATIURETIC PEPTID: 2705 pg/mL — AB (ref 0–125)

## 2013-11-22 LAB — TROPONIN I
TROPONIN-I: 0.3 ng/mL — AB
Troponin I: 17.21 ng/mL (ref <0.30)

## 2013-11-22 LAB — MAGNESIUM: Magnesium: 1.8 mg/dL

## 2013-11-22 LAB — CK-MB: CK-MB: 3.9 ng/mL — ABNORMAL HIGH (ref 0.5–3.6)

## 2013-11-22 SURGERY — LEFT HEART CATHETERIZATION WITH CORONARY ANGIOGRAM
Anesthesia: LOCAL

## 2013-11-22 SURGERY — CARDIOVERSION
Anesthesia: General

## 2013-11-22 MED ORDER — FUROSEMIDE 10 MG/ML IJ SOLN
INTRAMUSCULAR | Status: AC
  Filled 2013-11-22: qty 4

## 2013-11-22 MED ORDER — VERAPAMIL HCL 2.5 MG/ML IV SOLN
INTRAVENOUS | Status: AC
  Filled 2013-11-22: qty 2

## 2013-11-22 MED ORDER — DILTIAZEM LOAD VIA INFUSION
10.0000 mg | Freq: Once | INTRAVENOUS | Status: AC
  Administered 2013-11-22: 10 mg via INTRAVENOUS
  Filled 2013-11-22: qty 10

## 2013-11-22 MED ORDER — HEPARIN BOLUS VIA INFUSION
4000.0000 [IU] | Freq: Once | INTRAVENOUS | Status: AC
  Administered 2013-11-22: 4000 [IU] via INTRAVENOUS
  Filled 2013-11-22: qty 4000

## 2013-11-22 MED ORDER — NITROGLYCERIN IN D5W 200-5 MCG/ML-% IV SOLN
20.0000 ug/min | INTRAVENOUS | Status: DC
  Administered 2013-11-22: 20 ug/min via INTRAVENOUS
  Filled 2013-11-22: qty 250

## 2013-11-22 MED ORDER — DIPHENHYDRAMINE HCL 50 MG/ML IJ SOLN
INTRAMUSCULAR | Status: AC
  Filled 2013-11-22: qty 1

## 2013-11-22 MED ORDER — FENTANYL CITRATE 0.05 MG/ML IJ SOLN
INTRAMUSCULAR | Status: AC
  Administered 2013-11-22: 25 ug
  Filled 2013-11-22: qty 2

## 2013-11-22 MED ORDER — NITROGLYCERIN 0.4 MG SL SUBL
0.4000 mg | SUBLINGUAL_TABLET | SUBLINGUAL | Status: DC | PRN
  Administered 2013-11-22: 0.4 mg via SUBLINGUAL

## 2013-11-22 MED ORDER — ALPRAZOLAM 0.25 MG PO TABS: 0.2500 mg | ORAL_TABLET | Freq: Two times a day (BID) | ORAL | Status: DC | PRN

## 2013-11-22 MED ORDER — FUROSEMIDE 10 MG/ML IJ SOLN: 60.0000 mg | Freq: Once | INTRAMUSCULAR | Status: AC

## 2013-11-22 MED ORDER — HEPARIN SODIUM (PORCINE) 1000 UNIT/ML IJ SOLN
INTRAMUSCULAR | Status: AC
  Filled 2013-11-22: qty 1

## 2013-11-22 MED ORDER — ATORVASTATIN CALCIUM 80 MG PO TABS: 80.0000 mg | ORAL_TABLET | Freq: Every day | ORAL | Status: DC

## 2013-11-22 MED ORDER — ASPIRIN EC 325 MG PO TBEC
325.0000 mg | DELAYED_RELEASE_TABLET | Freq: Every day | ORAL | Status: DC
  Administered 2013-11-23: 325 mg via ORAL
  Filled 2013-11-22: qty 1

## 2013-11-22 MED ORDER — SODIUM CHLORIDE 0.9 % IJ SOLN: 3.0000 mL | INTRAMUSCULAR | Status: DC | PRN

## 2013-11-22 MED ORDER — DILTIAZEM HCL 100 MG IV SOLR
5.0000 mg/h | INTRAVENOUS | Status: DC
  Administered 2013-11-22: 5 mg/h via INTRAVENOUS

## 2013-11-22 MED ORDER — DILTIAZEM HCL 100 MG IV SOLR
5.0000 mg/h | INTRAVENOUS | Status: DC
  Administered 2013-11-22 – 2013-11-23 (×2): 10 mg/h via INTRAVENOUS
  Filled 2013-11-22: qty 100

## 2013-11-22 MED ORDER — SODIUM CHLORIDE 0.9 % IJ SOLN
3.0000 mL | INTRAMUSCULAR | Status: DC | PRN
Start: 2013-11-22 — End: 2013-11-22

## 2013-11-22 MED ORDER — SODIUM CHLORIDE 0.9 % IV SOLN
INTRAVENOUS | Status: AC
  Administered 2013-11-22: 19:00:00 via INTRAVENOUS

## 2013-11-22 MED ORDER — SODIUM CHLORIDE 0.9 % IV SOLN: INTRAVENOUS | Status: DC

## 2013-11-22 MED ORDER — SODIUM CHLORIDE 0.9 % IV SOLN: 250.0000 mL | INTRAVENOUS | Status: DC | PRN

## 2013-11-22 MED ORDER — METHYLPREDNISOLONE SODIUM SUCC 125 MG IJ SOLR
INTRAMUSCULAR | Status: AC
  Filled 2013-11-22: qty 2

## 2013-11-22 MED ORDER — PNEUMOCOCCAL VAC POLYVALENT 25 MCG/0.5ML IJ INJ
0.5000 mL | INJECTION | INTRAMUSCULAR | Status: AC
  Administered 2013-11-23: 0.5 mL via INTRAMUSCULAR
  Filled 2013-11-22: qty 0.5

## 2013-11-22 MED ORDER — SODIUM CHLORIDE 0.9 % IJ SOLN: 3.0000 mL | Freq: Two times a day (BID) | INTRAMUSCULAR | Status: DC

## 2013-11-22 MED ORDER — HEPARIN (PORCINE) IN NACL 100-0.45 UNIT/ML-% IJ SOLN
2300.0000 [IU]/h | INTRAMUSCULAR | Status: AC
  Administered 2013-11-22: 1800 [IU]/h via INTRAVENOUS
  Administered 2013-11-23: 2300 [IU]/h via INTRAVENOUS
  Filled 2013-11-22 (×2): qty 250

## 2013-11-22 MED ORDER — ATORVASTATIN CALCIUM 80 MG PO TABS
80.0000 mg | ORAL_TABLET | ORAL | Status: AC
  Administered 2013-11-22: 80 mg via ORAL
  Filled 2013-11-22: qty 1

## 2013-11-22 MED ORDER — NITROGLYCERIN 0.2 MG/ML ON CALL CATH LAB
INTRAVENOUS | Status: AC
  Filled 2013-11-22: qty 1

## 2013-11-22 MED ORDER — SODIUM CHLORIDE 0.9 % IV SOLN
INTRAVENOUS | Status: DC
  Administered 2013-11-22: 09:00:00 via INTRAVENOUS

## 2013-11-22 MED ORDER — HEPARIN (PORCINE) IN NACL 2-0.9 UNIT/ML-% IJ SOLN
INTRAMUSCULAR | Status: AC
  Filled 2013-11-22: qty 1000

## 2013-11-22 MED ORDER — FAMOTIDINE IN NACL 20-0.9 MG/50ML-% IV SOLN
INTRAVENOUS | Status: AC
  Filled 2013-11-22: qty 50

## 2013-11-22 MED ORDER — ONDANSETRON HCL 4 MG/2ML IJ SOLN: 4.0000 mg | Freq: Four times a day (QID) | INTRAMUSCULAR | Status: DC | PRN

## 2013-11-22 MED ORDER — MIDAZOLAM HCL 2 MG/2ML IJ SOLN
INTRAMUSCULAR | Status: AC
  Administered 2013-11-22: 2 mg
  Filled 2013-11-22: qty 2

## 2013-11-22 MED ORDER — NITROGLYCERIN 0.4 MG SL SUBL: 0.4000 mg | SUBLINGUAL_TABLET | SUBLINGUAL | Status: DC | PRN

## 2013-11-22 MED ORDER — SODIUM CHLORIDE 0.9 % IJ SOLN
3.0000 mL | Freq: Two times a day (BID) | INTRAMUSCULAR | Status: DC
  Administered 2013-11-22: 3 mL via INTRAVENOUS

## 2013-11-22 MED ORDER — LIDOCAINE HCL (PF) 1 % IJ SOLN
INTRAMUSCULAR | Status: AC
Start: 2013-11-22 — End: 2013-11-22
  Filled 2013-11-22: qty 30

## 2013-11-22 MED ORDER — ACETAMINOPHEN 325 MG PO TABS: 650.0000 mg | ORAL_TABLET | ORAL | Status: DC | PRN

## 2013-11-22 MED ORDER — CARVEDILOL 3.125 MG PO TABS
3.1250 mg | ORAL_TABLET | Freq: Two times a day (BID) | ORAL | Status: DC
  Administered 2013-11-22 – 2013-11-23 (×2): 3.125 mg via ORAL
  Filled 2013-11-22 (×4): qty 1

## 2013-11-22 MED ORDER — ZOLPIDEM TARTRATE 5 MG PO TABS: 5.0000 mg | ORAL_TABLET | Freq: Every evening | ORAL | Status: DC | PRN

## 2013-11-22 MED ORDER — THIAMINE HCL 100 MG/ML IJ SOLN
100.0000 mg | Freq: Every day | INTRAMUSCULAR | Status: DC
  Administered 2013-11-22: 100 mg via INTRAVENOUS
  Filled 2013-11-22: qty 1

## 2013-11-22 MED ORDER — AMIODARONE HCL IN DEXTROSE 360-4.14 MG/200ML-% IV SOLN
30.0000 mg/h | INTRAVENOUS | Status: DC
  Filled 2013-11-22: qty 200

## 2013-11-22 MED ORDER — AMIODARONE HCL IN DEXTROSE 360-4.14 MG/200ML-% IV SOLN: 60.0000 mg/h | INTRAVENOUS | Status: AC

## 2013-11-22 MED ORDER — HEPARIN (PORCINE) IN NACL 100-0.45 UNIT/ML-% IJ SOLN
1800.0000 [IU]/h | INTRAMUSCULAR | Status: DC
  Administered 2013-11-22: 1800 [IU]/h via INTRAVENOUS
  Filled 2013-11-22 (×2): qty 250

## 2013-11-22 MED ORDER — MIDAZOLAM HCL 2 MG/2ML IJ SOLN
INTRAMUSCULAR | Status: AC
  Filled 2013-11-22: qty 2

## 2013-11-22 MED ORDER — NITROGLYCERIN 0.4 MG SL SUBL
SUBLINGUAL_TABLET | SUBLINGUAL | Status: AC
  Filled 2013-11-22: qty 1

## 2013-11-22 MED ORDER — INSULIN ASPART 100 UNIT/ML ~~LOC~~ SOLN
0.0000 [IU] | Freq: Three times a day (TID) | SUBCUTANEOUS | Status: DC
  Administered 2013-11-22: 8 [IU] via SUBCUTANEOUS
  Administered 2013-11-22: 5 [IU] via SUBCUTANEOUS
  Administered 2013-11-23 (×2): 3 [IU] via SUBCUTANEOUS
  Administered 2013-11-23: 5 [IU] via SUBCUTANEOUS
  Administered 2013-11-24 (×3): 3 [IU] via SUBCUTANEOUS
  Administered 2013-11-25 (×2): 2 [IU] via SUBCUTANEOUS
  Administered 2013-11-25: 3 [IU] via SUBCUTANEOUS
  Administered 2013-11-26 – 2013-11-27 (×4): 2 [IU] via SUBCUTANEOUS

## 2013-11-22 NOTE — H&P (Addendum)
Patient ID: Colton Lamb MRN: [id number], DOB/AGE: 02/02/1951   Admit date: 11/22/2013  Primary Physician: Mendel Ryder, MD Primary Cardiologist: Dr Ida Rogue  Pt. Profile:  A-flutter  Problem List  Past Medical History  Diagnosis Date  . Crohn's ileocolitis   . GERD (gastroesophageal reflux disease)   . Hx of adenomatous colonic polyps 11/2003  . Coronary artery disease     s/p stent placement  . MI (myocardial infarction) 2005-10-19  . Hypertension   . Obesity   . Atrial fibrillation     Past Surgical History  Procedure Laterality Date  . 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 Cardiac catheterization  08/2006    drug-eluting stent   . Hydrocele excision      bilateral  . Foot surgery      left, bone spur     Allergies  Allergies  Allergen Reactions  . Iodine   . Tetracycline     HPI:  Mr. Bontempo is a 63 year old gentleman with a history of paroxysmal atrial fibrillation, last episode in 19-Oct-2006 per the patient, hyperlipidemia, coronary artery disease, PCI x3 to the LAD, circumflex, PDA and PL branch, ejection fraction 45-50% with anterior hypokinesis, last catheterization in October 19, 2006 with admission for unstable angina found to have in-stent restenosis of the little the RCA and had treatment with a cypher stent to the OM who presents for routine followup.He has sleep apnea, has not been using CPAP.  He reports that his wife passed away in 20-Oct-2011 and since then he has been eating more, drinking more water.  On his last clinic visit, he has significant weight gain and lower leg edema. We started Lasix 40 mg in the morning on a daily basis..  Since that time, he reports his breathing has improved, edema has resolved. Overall he feels well. Weight continues to trend upward despite diuresis. Previous blood work earlier in the year showed glucose elevated consistent with prediabetes.  Cardiac catheterization Oct 19, 2006 showed patent  LAD stent proximally, large codominant circumflex with patent stent, RCA with 40-50% stenosis in the distal right, 80% instant restenosis in the mid PDA in the setting of a prior cutting balloon atherectomy. A 2.5 x 13 mm Cypher stent was placed.  Last stress test 10-19-08 showing moderate to severe perfusion defect in the mid anteroseptal, apical anterior, apical septal, apical and septal regions consistent with infarct or scar. Also in the inferior wall. No significant ischemia. Ejection fraction 54%.  Echocardiogram March 2008 shows ejection fraction 45-55%, mild to moderately dilated, mid to distal anterior septal wall with severe hypokinesis, mild LVH  EKG shows normal sinus rhythm with rate 79 beats per minute, poor R-wave progression, unable to rule out anterior infarct, left axis deviation, nonspecific ST abnormality in the anterolateral leads.  The patient woke up at 12: 30 am today with chest pain radiating to his left arm, it was accompanied by SOB and palpitations. He admits to feel down after his wife passed and admits to not taking any meds in the last few weeks.He went to the [facility] hospital from where he was transferred to [facility] for management of A-flutter. He received NTG in [place] that relieved his pain.   Home Medications  Prior to Admission medications   Medication Sig Start Date End Date Taking? Authorizing Provider  aspirin 81 MG tablet Take 81 mg by mouth daily.     Yes Historical Provider, MD  atorvastatin (LIPITOR) 80 MG tablet  TAKE 1 TABLET BY MOUTH EVERY DAY 02/04/13  Yes Minna Merritts, MD  carvedilol (COREG CR) 80 MG 24 hr capsule TAKE ONE CAPSULE BY MOUTH EVERY DAY 06/17/13  Yes Minna Merritts, MD  clopidogrel (PLAVIX) 75 MG tablet TAKE 1 TABLET (75 MG TOTAL) BY MOUTH DAILY. 07/15/13  Yes Minna Merritts, MD  digoxin (LANOXIN) 0.25 MG tablet TAKE 1 TABLET (0.25 MG TOTAL) BY MOUTH DAILY. 05/21/13  Yes Minna Merritts, MD  furosemide (LASIX) 20 MG tablet  TAKE 1 TABLET (20 MG TOTAL) BY MOUTH DAILY. 04/15/13  Yes Minna Merritts, MD  isosorbide mononitrate (IMDUR) 30 MG 24 hr tablet TAKE 1 TABLET (30 MG TOTAL) BY MOUTH DAILY. 11/14/12  Yes Minna Merritts, MD  lisinopril (PRINIVIL,ZESTRIL) 20 MG tablet Take 1 tablet (20 mg total) by mouth daily. 04/15/13  Yes Minna Merritts, MD  Mesalamine (ASACOL HD) 800 MG TBEC Take 1 tablet (800 mg total) by mouth 3 (three) times daily. 08/10/12  Yes Ladene Artist, MD  niacin (NIASPAN) 1000 MG CR tablet TAKE 1 TABLET BY MOUTH AT BEDTIME 08/22/13  Yes Minna Merritts, MD  omeprazole (PRILOSEC) 20 MG capsule Take 20 mg by mouth daily.     Yes Historical Provider, MD  warfarin (COUMADIN) 3 MG tablet Take 3 mg by mouth one time only at 6 PM. Take as directed by anticoagulation clinic 04/15/13  Yes Minna Merritts, MD  nitroGLYCERIN (NITROSTAT) 0.4 MG SL tablet Place 0.4 mg under the tongue every 5 (five) minutes as needed.      Historical Provider, MD   Family History  Family History  Problem Relation Age of Onset  . Breast cancer Mother   . Heart disease Father   . Colon cancer Neg Hx     Social History  History   Social History  . Marital Status: Married    Spouse Name: N/A    Number of Children: 1  . Years of Education: N/A   Occupational History  . retired    Social History Main Topics  . Smoking status: Never Smoker   . Smokeless tobacco: Never Used  . Alcohol Use: No  . Drug Use: No  . Sexual Activity: Not on file   Other Topics Concern  . Not on file   Social History Narrative  . No narrative on file     Review of Systems General:  No chills, fever, night sweats or weight changes.  Cardiovascular:  No chest pain, dyspnea on exertion, edema, orthopnea, palpitations, paroxysmal nocturnal dyspnea. Dermatological: No rash, lesions/masses Respiratory: No cough, dyspnea Urologic: No hematuria, dysuria Abdominal:   No nausea, vomiting, diarrhea, bright red blood per rectum, melena,  or hematemesis Neurologic:  No visual changes, wkns, changes in mental status. All other systems reviewed and are otherwise negative except as noted above.  Physical Exam  Blood pressure 140/110, pulse 131, temperature 98.6 F (37 C), temperature source Oral, resp. rate 25, height 5' 11"  (1.803 m), weight 313 lb 11.4 oz (142.3 kg), SpO2 94.00%.  General: Pleasant, NAD Psych: Normal affect. Neuro: Alert and oriented X 3. Moves all extremities spontaneously. HEENT: Normal  Neck: Supple without bruits or JVD. Lungs:  Resp regular and unlabored, mild rales B/L. Heart: RRR no s3, s4, or murmurs. Abdomen: Soft, non-tender, non-distended, BS + x 4.  Extremities: No clubbing, cyanosis, nonpitting chronic B/L edema. DP/PT/Radials 2+ and equal bilaterally.  Labs  No results found for this basename: CKTOTAL, CKMB, TROPONINI,  in the last 72 hours Lab Results  Component Value Date   WBC 8.5 08/10/2011   HGB 13.8 08/10/2011   HCT 40.1 08/10/2011   MCV 86.5 08/10/2011   PLT 223.0 08/10/2011   No results found for this basename: NA, K, CL, CO2, BUN, CREATININE, CALCIUM, LABALBU, PROT, BILITOT, ALKPHOS, ALT, AST, GLUCOSE,  in the last 168 hours Lab Results  Component Value Date   CHOL 96 01/04/2011   HDL 35* 01/04/2011   LDLCALC 45 01/04/2011   TRIG 82 01/04/2011   Radiology/Studies:  ECG: A-flutter with 2: 1 block alternating with variable block    ASSESSMENT AND PLAN  63 year old male   1. NSTEMI - known CAD - the last cardiac catheterization in February 2008 showed patent LAD stent proximally, large codominant circumflex with patent stent, RCA with 40-50% stenosis in the distal right, 80% instant restenosis in the mid PDA in the setting of a prior cutting balloon atherectomy. A 2.5 x 13 mm Cypher stent was placed.  The lab just called critical value Troponin of 17, we will start Heparin, restart ASA, atorvastatin, schedule a left cardiac cath  2. A-flutter - started heparin drip, cardizem  drip   Signed, Dorothy Spark, MD, [facility] 11/22/2013, 8:52 AM

## 2013-11-22 NOTE — Progress Notes (Addendum)
ANTICOAGULATION CONSULT NOTE - Initial Consult  Pharmacy Consult for Heparin Indication: chest pain/ACS  Allergies  Allergen Reactions  . Iodine   . Tetracycline    Patient Measurements: Height: 5' 11"  (180.3 cm) Weight: 313 lb 11.4 oz (142.3 kg) IBW/kg (Calculated) : 75.3 Heparin Dosing Weight: 94 kg  Vital Signs: Temp: 98.6 F (37 C) (04/24 0735) Temp src: Oral (04/24 0735) BP: 131/111 mmHg (04/24 0900) Pulse Rate: 131 (04/24 0800)  Labs:  Recent Labs  11/22/13 0810  TROPONINI 17.21*   Estimated Creatinine Clearance: 131.7 ml/min (by C-G formula based on Cr of 0.84).  Medical History: Past Medical History  Diagnosis Date  . Crohn's ileocolitis   . GERD (gastroesophageal reflux disease)   . Hx of adenomatous colonic polyps 11/2003  . Coronary artery disease     s/p stent placement  . MI (myocardial infarction) 2007  . Hypertension   . Obesity   . Atrial fibrillation    Medications:  Infusions:  . sodium chloride 10 mL/hr at 11/22/13 0900  . [START ON 11/23/2013] sodium chloride    . diltiazem (CARDIZEM) infusion 10 mg/hr (11/22/13 0947)  . heparin      Assessment: 63yo male admitted with chest pain.  His troponin is elevated this am to 17.2 and he is being started on IV heparin.  His baseline labs have been ordered.  Noted plans for cardiac cath today.  I spoke with the patient and he reports no history of any bleeding issues.  Goal of Therapy:  Heparin level 0.3-0.7 units/ml Monitor platelets by anticoagulation protocol: Yes   Plan:  1.  Begin IV heparin with 4000 units bolus 2.  Begin IV heparin drip at 1800 units/hr 3.  Obtain a heparin level in 8 hours if cath postponed.  Rober Minion, PharmD., MS Clinical Pharmacist Pager:  [phone] Thank you for allowing pharmacy to be part of this patients care team. 11/22/2013,9:54 AM  Addendum: Resuming IV heparin until thrombus is ruled out.  Plan is aggressive medical management.  Will get  echocardiogram and f/u plans for oral anticoagulation.  Plan: Resume IV heparin at 1800 units/hr to restart in 8 hours from sheath (radial access band) removed Check heparin level and adjust as needed.  Rober Minion, PharmD., MS Clinical Pharmacist Pager:  [phone] Thank you for allowing pharmacy to be part of this patients care team.

## 2013-11-22 NOTE — Progress Notes (Signed)
Upon morning assessment, pt coughed and vageled down, dropping his heart rate into the 50's; pt's eyes rolled in the back of his head, and then he quickly came back to, and stated he did not feel any different; Vital signs 136/108 (112); MD paged; will continue to monitor closely and update as needed

## 2013-11-22 NOTE — Interval H&P Note (Signed)
Cath Lab Visit (complete for each Cath Lab visit)  Clinical Evaluation Leading to the Procedure:   ACS: yes  Non-ACS:    Anginal Classification: CCS IV  Anti-ischemic medical therapy: Minimal Therapy (1 class of medications)  Non-Invasive Test Results: No non-invasive testing performed  Prior CABG: No previous CABG      History and Physical Interval Note:  11/22/2013 1:10 PM  Colton Lamb  has presented today for surgery, with the diagnosis of cp  The various methods of treatment have been discussed with the patient and family. After consideration of risks, benefits and other options for treatment, the patient has consented to  Procedure(s): LEFT HEART CATHETERIZATION WITH CORONARY ANGIOGRAM (N/A) as a surgical intervention .  The patient's history has been reviewed, patient examined, no change in status, stable for surgery.  I have reviewed the patient's chart and labs.  Questions were answered to the patient's satisfaction.     [name]

## 2013-11-22 NOTE — Interval H&P Note (Signed)
History and Physical Interval Note:  11/22/2013 1:09 PM Clinical data reviewed and patient examined. Past medical history reviewed. History of prior stents. He presented this morning with chest pain, atrial flutter with poor rate control, and elevated troponins. He has undergone successful electrical cardioversion. He is not having chest pain. We will cath the patient to define coronary anatomy. The procedure risks were discussed in detail with the patient. Colton Lamb  has presented today for surgery, with the diagnosis of cp  The various methods of treatment have been discussed with the patient and family. After consideration of risks, benefits and other options for treatment, the patient has consented to  Procedure(s): LEFT HEART CATHETERIZATION WITH CORONARY ANGIOGRAM (N/A) as a surgical intervention .  The patient's history has been reviewed, patient examined, no change in status, stable for surgery.  I have reviewed the patient's chart and labs.  Questions were answered to the patient's satisfaction.     [name]

## 2013-11-22 NOTE — Anesthesia Preprocedure Evaluation (Deleted)
Anesthesia Evaluation    Airway Mallampati: III      Dental   Pulmonary          Cardiovascular hypertension, + CAD and + Past MI + dysrhythmias Atrial Fibrillation     Neuro/Psych    GI/Hepatic GERD-  ,  Endo/Other    Renal/GU      Musculoskeletal   Abdominal   Peds  Hematology   Anesthesia Other Findings   Reproductive/Obstetrics                          Anesthesia Physical Anesthesia Plan  ASA: III  Anesthesia Plan: MAC   Post-op Pain Management:    Induction:   Airway Management Planned:   Additional Equipment:   Intra-op Plan:   Post-operative Plan:   Informed Consent:   Plan Discussed with:   Anesthesia Plan Comments:         Anesthesia Quick Evaluation

## 2013-11-22 NOTE — Progress Notes (Signed)
Pt arrived to 2H13 at approximately 0500 via [facility] with Amiodarone infusing at 60 mg/hr. Per report from RN at [facility]), Amiodarone infusion was started at approximately 0300 and was intended to run for 6 hours. Upon patient's arrival to [place], on-call MD was paged. Dr. Inez Pilgrim returned the page and gave verbal orders to perform EKG. MD arrived to patient's bedside at approximately 0530 to review EKG. Received verbal orders to continue Amiodarone infusion at 60 mg/hr for a total of 6 hours (until 0900) followed by a continuous infusion at 30 mg/hr. EKG was reviewed and no orders were written for patient at that time. MD said he would return later. Also, administered 1 SL NTG to patient at 0600 for CP rated 4/10. Chest pain resolved to 0/10 at 0615. Will continue to monitor. Ananias Pilgrim, RN

## 2013-11-22 NOTE — Progress Notes (Signed)
Pt informed me that he had not been taking his medications over the past couple of weeks because he "didn't feel like it," denied financial troubles as a reason. Pt has overall poor hygiene and unhealthy appearance. Informed pt importance of taking medications as prescribed; will continue to monitor. Ananias Pilgrim, RN

## 2013-11-22 NOTE — CV Procedure (Signed)
    Cardioversion Note  Colton Lamb [id number] 13-Jul-1951  Procedure: DC Cardioversion Indications: atrial flutter  Procedure Details Consent: Obtained Time Out: Verified patient identification, verified procedure, site/side was marked, verified correct patient position, special equipment/implants available, Radiology Safety Procedures followed,  medications/allergies/relevent history reviewed, required imaging and test results available.  Performed  The patient has been on adequate anticoagulation.  The patient received IV 3 mg of iv midazolam and 25 mcg of iv Fentanyl for sedation.  Synchronous cardioversion was performed at 150 joules.  The cardioversion was successful.   Complications: No apparent complications Patient did tolerate procedure well.   Dorothy Spark, MD, [facility] 11/22/2013, 12:24 PM

## 2013-11-22 NOTE — CV Procedure (Signed)
Left Heart Catheterization with Coronary Angiography  Report  Colton Lamb  63 y.o.  male [birth year]  Procedure Date: 11/22/2013 Referring Physician: Lilian Kapur, M.D. Primary Cardiologist: Ida Rogue, M.D.  INDICATIONS: Non-ST elevation MI in setting of atrial flutter of unknown duration. No chest discomfort is present at the time of the procedure. He denies dyspnea and is able to lie flat. He underwent urgent electrical cardioversion approximately 3 hours ago.  PROCEDURE: 1. Left heart catheterization; 2. Coronary angiography; 3. Left ventriculography  CONSENT:  The risks, benefits, and details of the procedure were explained in detail to the patient. Risks including death, stroke, heart attack, kidney injury, allergy, limb ischemia, bleeding and radiation injury were discussed.  The patient verbalized understanding and wanted to proceed.  Informed written consent was obtained.  PROCEDURE TECHNIQUE:  After Xylocaine anesthesia a 5 French Slender sheath was placed in the right radial artery with an angiocath and the modified Seldinger technique.  Coronary angiography was done using a 5 F JR 4, JL 3.5, and he used 3.0 diagnostic catheters.  Left ventriculography was done using the JR 4 diagnostic catheter and hand injection.   Did not use sedation as the patient had a tendency towards desaturation.  We identified total occlusion in the proximal LAD stent. Duration of occlusion is unknown. No chest discomfort was occurring during the procedure and EKGs post cardioversion did not demonstrate ST elevation. I have a feeling that we are dealing with the consequences of tachycardia induced ischemia/cardiomyopathy versus a late presenting anterior infarction do to late stent thrombosis.   CONTRAST:  Total of 100 cc.  COMPLICATIONS:  None   HEMODYNAMICS:  Aortic pressure 120/93 mmHg; LV pressure 126/44 mmHg; LVEDP 46 mmHg  ANGIOGRAPHIC DATA:   The left main coronary artery is  widely patent.  The left anterior descending artery is totally occluded proximally within the previously placed stent.  The left circumflex artery is widely patent.  The right coronary artery is right coronary is codominant. The distal RCA leading into the PDA contains segmental 70% stenosis.   LEFT VENTRICULOGRAM:  Left ventricular angiogram was done in the 30 RAO projection and revealed a dilated LV and severe dysfunction. We only performed hand injection due to the severely elevated LVEDP. I would estimate that the patient's LV EF would be 20% or less.   IMPRESSIONS:  1. LAD stent occlusion, duration unknown. QS pattern across the precordium on EKG suggested this is not in acute.  2. Moderate distal right coronary obstruction and widely patent circumflex coronary artery  3. Severe left ventricular systolic function, decompensated with severe elevation in LVEDP. I suspect LV systolic dysfunction is mixed in etiology including the results of anterior infarction of unknown timing but presumed recent and/or a component of tachycardia induced LV dysfunction.  4. Atrial flutter of unknown duration  5. Non-ST elevation myocardial infarction, difficult to sort out relative contribution of demand related to tachycardia versus obstructive disease and timing of LAD occlusion. The patient is totally pain-free at this time. He states the chest pain started last night at midnight. He is not dyspneic and his blood pressure is controlled.   RECOMMENDATION:  Aggressive medical therapy of LV systolic dysfunction  IV nitroglycerin to lower filling pressures  IV diuresis. A single dose of Lasix 60 mg was given post cath.  IV heparin should be resumed until LV apical thrombus can't be excluded. An echocardiogram needs to be done as soon as possible.  Low-dose beta blocker  therapy  Consider heart failure team consult  May need AICD therapy  I would suggest amiodarone to prevent recurrent atrial  flutter  We'll need to resume long-term anticoagulation at some point during this hospital stay  Discussed medication compliance.

## 2013-11-23 DIAGNOSIS — Z7901 Long term (current) use of anticoagulants: Secondary | ICD-10-CM

## 2013-11-23 DIAGNOSIS — I517 Cardiomegaly: Secondary | ICD-10-CM

## 2013-11-23 DIAGNOSIS — R609 Edema, unspecified: Secondary | ICD-10-CM

## 2013-11-23 LAB — CBC
HEMATOCRIT: 39.4 % (ref 39.0–52.0)
Hemoglobin: 13.2 g/dL (ref 13.0–17.0)
MCH: 26.8 pg (ref 26.0–34.0)
MCHC: 33.5 g/dL (ref 30.0–36.0)
MCV: 80.1 fL (ref 78.0–100.0)
Platelets: 271 10*3/uL (ref 150–400)
RBC: 4.92 MIL/uL (ref 4.22–5.81)
RDW: 14.7 % (ref 11.5–15.5)
WBC: 17.3 10*3/uL — AB (ref 4.0–10.5)

## 2013-11-23 LAB — COMPREHENSIVE METABOLIC PANEL
ALBUMIN: 2.9 g/dL — AB (ref 3.5–5.2)
ALT: 344 U/L — ABNORMAL HIGH (ref 0–53)
AST: 597 U/L — ABNORMAL HIGH (ref 0–37)
Alkaline Phosphatase: 138 U/L — ABNORMAL HIGH (ref 39–117)
BUN: 24 mg/dL — AB (ref 6–23)
CALCIUM: 8.9 mg/dL (ref 8.4–10.5)
CO2: 17 mEq/L — ABNORMAL LOW (ref 19–32)
CREATININE: 1.29 mg/dL (ref 0.50–1.35)
Chloride: 94 mEq/L — ABNORMAL LOW (ref 96–112)
GFR calc Af Amer: 67 mL/min — ABNORMAL LOW (ref >90)
GFR calc non Af Amer: 58 mL/min — ABNORMAL LOW (ref >90)
Glucose, Bld: 273 mg/dL — ABNORMAL HIGH (ref 70–99)
Potassium: 4.4 mEq/L (ref 3.7–5.3)
Sodium: 132 mEq/L — ABNORMAL LOW (ref 137–147)
Total Bilirubin: 0.6 mg/dL (ref 0.3–1.2)
Total Protein: 6.9 g/dL (ref 6.0–8.3)

## 2013-11-23 LAB — LIPID PANEL
CHOL/HDL RATIO: 3.2 ratio
Cholesterol: 131 mg/dL (ref 0–200)
HDL: 41 mg/dL (ref >39)
LDL CALC: 79 mg/dL (ref 0–99)
Triglycerides: 54 mg/dL (ref <150)
VLDL: 11 mg/dL (ref 0–40)

## 2013-11-23 LAB — TROPONIN I

## 2013-11-23 LAB — GLUCOSE, CAPILLARY
Glucose-Capillary: 204 mg/dL — ABNORMAL HIGH (ref 70–99)
Glucose-Capillary: 185 mg/dL — ABNORMAL HIGH (ref 70–99)
Glucose-Capillary: 174 mg/dL — ABNORMAL HIGH (ref 70–99)
Glucose-Capillary: 226 mg/dL — ABNORMAL HIGH (ref 70–99)

## 2013-11-23 LAB — HEPARIN LEVEL (UNFRACTIONATED): Heparin Unfractionated: <0.1 IU/mL — ABNORMAL LOW (ref 0.30–0.70)

## 2013-11-23 MED ORDER — AMIODARONE HCL 200 MG PO TABS
200.0000 mg | ORAL_TABLET | Freq: Two times a day (BID) | ORAL | Status: DC
  Administered 2013-11-23 – 2013-11-30 (×15): 200 mg via ORAL
  Filled 2013-11-23 (×17): qty 1

## 2013-11-23 MED ORDER — RIVAROXABAN 20 MG PO TABS
20.0000 mg | ORAL_TABLET | Freq: Every day | ORAL | Status: DC
  Administered 2013-11-23 – 2013-11-30 (×8): 20 mg via ORAL
  Filled 2013-11-23 (×9): qty 1

## 2013-11-23 MED ORDER — FUROSEMIDE 10 MG/ML IJ SOLN
40.0000 mg | Freq: Two times a day (BID) | INTRAMUSCULAR | Status: DC
  Administered 2013-11-23 – 2013-11-26 (×7): 40 mg via INTRAVENOUS
  Filled 2013-11-23 (×11): qty 4

## 2013-11-23 MED ORDER — PERFLUTREN LIPID MICROSPHERE
1.0000 mL | INTRAVENOUS | Status: AC | PRN
  Filled 2013-11-23: qty 10

## 2013-11-23 MED ORDER — CARVEDILOL 6.25 MG PO TABS
6.2500 mg | ORAL_TABLET | Freq: Two times a day (BID) | ORAL | Status: DC
  Administered 2013-11-23 – 2013-11-24 (×2): 6.25 mg via ORAL
  Filled 2013-11-23 (×4): qty 1

## 2013-11-23 MED ORDER — PERFLUTREN LIPID MICROSPHERE
INTRAVENOUS | Status: AC
  Administered 2013-11-23: 15:00:00
  Filled 2013-11-23: qty 10

## 2013-11-23 MED ORDER — ISOSORBIDE MONONITRATE 15 MG HALF TABLET
15.0000 mg | ORAL_TABLET | Freq: Every day | ORAL | Status: DC
  Administered 2013-11-23: 15 mg via ORAL
  Filled 2013-11-23 (×2): qty 1

## 2013-11-23 NOTE — Progress Notes (Signed)
CARDIAC REHAB PHASE I   PRE:  Rate/Rhythm: 81 SR  BP:  Supine:   Sitting: 130/94  Standing:    SaO2: 99%RA  MODE:  Ambulation: 40 ft   POST:  Rate/Rhythm: 96 SR  BP:  Supine:   Sitting: 126/93  Standing:    SaO2: 99%RA 1040-1107 Pt only able to walk 40 ft on RA with asst x 1 due to generalized weakness and some SOB. Sats good on RA. To recliner with call bell. Gave MI booklet. No CP. Remained in NSR.   Graylon Good, RN BSN  11/23/2013 11:04 AM

## 2013-11-23 NOTE — Progress Notes (Signed)
EKG CRITICAL VALUE     12 lead EKG performed.  Critical value noted.  Antionette Fairy, RN notified.   Georga Kaufmann, CCT 11/23/2013 8:09 AM

## 2013-11-23 NOTE — Progress Notes (Addendum)
Primary Cardiologist: Ida Rogue, M.D.  Subjective:  Overall doing well, no specific complaints. Yesterday, 4/24, he states that he woke up at around 12:30 AM with chest pain, left arm pain. The day before he was driving around with no difficulty. At the time of cardiac catheterization he was having no discomfort.  Cardiac catheterization 11/22/13 revealed occluded LAD stent. Dr. Tamala Julian was concerned about possible LV thrombus. He was also cardioverted for atrial flutter on 4/24 prior to cardiac catheterization.  Objective:  Vital Signs in the last 24 hours: Temp:  [97 F (36.1 C)-98.1 F (36.7 C)] 97.6 F (36.4 C) (04/25 0742) Pulse Rate:  [95-100] 95 (04/25 0742) Resp:  [17-32] 23 (04/25 0900) BP: (109-151)/(80-133) 109/80 mmHg (04/25 0900) SpO2:  [91 %-98 %] 91 % (04/25 0900) Weight:  [314 lb 9.5 oz (142.7 kg)] 314 lb 9.5 oz (142.7 kg) (04/25 0500)  Intake/Output from previous day: 04/24 0701 - 04/25 0700 In: 1340.9 [P.O.:580; I.V.:757.9] Out: 850 [Urine:850]   Physical Exam: General: Well developed, well nourished, in no acute distress. Head:  Normocephalic and atraumatic. Mid neck JVD Lungs: Clear to auscultation and percussion. Normal respiratory effort distant breath sounds Heart: Normal S1 and S2. Distant heart sounds No murmur, rubs or gallops.  Abdomen: soft, non-tender, positive bowel sounds. Obese Extremities: No clubbing or cyanosis. 2+ lower extremity edema. Minor excoriations noted radial site intact normal pulse Neurologic: Alert and oriented x 3.    Lab Results:  Recent Labs  11/22/13 1000 11/23/13 0350  WBC 24.0* 17.3*  HGB 15.4 13.2  PLT 269 271    Recent Labs  11/22/13 1000  NA 135*  K 4.5  CL 95*  CO2 17*  GLUCOSE 308*  BUN 18  CREATININE 1.16    Recent Labs  11/22/13 2253 11/23/13 0141  TROPONINI >20.00* >20.00*   Hepatic Function Panel  Recent Labs  11/22/13 1000  PROT 7.6  ALBUMIN 3.2*  AST 214*  ALT 138*  ALKPHOS  191*  BILITOT 0.9    Recent Labs  11/23/13 0200  CHOL 131   No results found for this basename: PROTIME,  in the last 72 hours  Imaging: No results found. Personally viewed.   Telemetry: No ventricular tachycardia, sinus rhythm 80s, no further atrial flutter Personally viewed.   EKG:  4/25-8:03 AM-1 mm ST segment elevation diffuse precordial leads as well as lateral leads with reciprocal ST segment depression in aVF and 3  Cardiac Studies:  Cardiac catheterization 4/25-Dr. Tamala Julian:  1. LAD stent occlusion, duration unknown. QS pattern across the precordium on EKG suggested this is not in acute.  2. Moderate distal right coronary obstruction and widely patent circumflex coronary artery  3. Severe left ventricular systolic function, decompensated with severe elevation in LVEDP. I suspect LV systolic dysfunction is mixed in etiology including the results of anterior infarction of unknown timing but presumed recent and/or a component of tachycardia induced LV dysfunction.  4. Atrial flutter of unknown duration  5. Non-ST elevation myocardial infarction, difficult to sort out relative contribution of demand related to tachycardia versus obstructive disease and timing of LAD occlusion. The patient is totally pain-free at this time. He states the chest pain started last night at midnight. He is not dyspneic and his blood pressure is controlled.  RECOMMENDATION: Aggressive medical therapy of LV systolic dysfunction  IV nitroglycerin to lower filling pressures  IV diuresis. A single dose of Lasix 60 mg was given post cath.  IV heparin should be resumed until LV  apical thrombus can't be excluded. An echocardiogram needs to be done as soon as possible.  Low-dose beta blocker therapy  Consider heart failure team consult  May need AICD therapy  I would suggest amiodarone to prevent recurrent atrial flutter  We'll need to resume long-term anticoagulation at some point during this hospital stay    Discussed medication compliance.   Assessment/Plan:   63 year old male with non-ST elevation myocardial infarction, troponin greater than 20, paroxysmal atrial flutter status post cardioversion on 4/24 urgently prior to heart catheterization with severe LV systolic dysfunction, acute systolic heart failure.  1. Non-ST elevation myocardial infarction-highly elevated troponin. Occluded LAD stent of unknown duration. No PCI performed. Severe LV systolic dysfunction. Continue to optimize medications. -Increase carvedilol to 6.25 g twice a day -Discontinued diltiazem in the setting of severe LV systolic dysfunction -Heparin IV  2. Atrial flutter -Xarelto (encourage compliance) (stop aspirin and IV heparin once starting this medication, pharmacy to assist) -Cardioversion 4/24 -We'll place him on amiodarone 200 PO BID gentle load to help keep him out of atrial flutter. He would be optimal for him to be on another antiarrhythmic given his age Other possibility includes Tikosyn. I'm concerned about medical compliance however and he would need to demonstrate this more vigorously prior to proceeding with this medication. If atrial flutter returns, could consider ablation. -In the past, he has failed compliance with Coumadin.  3. 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 Contrast if necessary to exclude LV thrombus. -If ejection fraction does not improve over the next several weeks, ICD. -Lasix 40 mg IV twice a day  4. Morbid obesity-encourage weight loss  5. Coronary artery disease-occluded LAD stent. Unknown duration. No PCI performed on 4/24 by Dr. Tamala Julian. EKG demonstrates continued ST segment changes. Optimize medical management.  Complex medical issues.   Candee Furbish 11/23/2013, 9:40 AM

## 2013-11-23 NOTE — Discharge Instructions (Signed)
Information on my medicine - XARELTO (Rivaroxaban)  This medication education was reviewed with me or my healthcare representative as part of my discharge preparation.  The pharmacist that spoke with me during my hospital stay was:  Ace Gins, [facility]  Why was Xarelto prescribed for you? Xarelto was prescribed for you to reduce the risk of a blood clot forming that can cause a stroke if you have a medical condition called atrial fibrillation (a type of irregular heartbeat).  What do you need to know about xarelto ? Take your Xarelto ONCE DAILY at the same time every day with your evening meal. If you have difficulty swallowing the tablet whole, you may crush it and mix in applesauce just prior to taking your dose.  Take Xarelto exactly as prescribed by your doctor and DO NOT stop taking Xarelto without talking to the doctor who prescribed the medication.  Stopping without other stroke prevention medication to take the place of Xarelto may increase your risk of developing a clot that causes a stroke.  Refill your prescription before you run out.  After discharge, you should have regular check-up appointments with your healthcare provider that is prescribing your Xarelto.  In the future your dose may need to be changed if your kidney function or weight changes by a significant amount.  What do you do if you miss a dose? If you are taking Xarelto ONCE DAILY and you miss a dose, take it as soon as you remember on the same day then continue your regularly scheduled once daily regimen the next day. Do not take two doses of Xarelto at the same time or on the same day.   Important Safety Information A possible side effect of Xarelto is bleeding. You should call your healthcare provider right away if you experience any of the following:   Bleeding from an injury or your nose that does not stop.   Unusual colored urine (red or dark brown) or unusual colored stools (red or black).   Unusual  bruising for unknown reasons.   A serious fall or if you hit your head (even if there is no bleeding).  Some medicines may interact with Xarelto and might increase your risk of bleeding while on Xarelto. To help avoid this, consult your healthcare provider or pharmacist prior to using any new prescription or non-prescription medications, including herbals, vitamins, non-steroidal anti-inflammatory drugs (NSAIDs) and supplements.  This website has more information on Xarelto: [email or web address]

## 2013-11-23 NOTE — Progress Notes (Signed)
ANTICOAGULATION CONSULT NOTE - Follow Up Consult  Pharmacy Consult for heparin/xarelto Indication: atrial fibrillation  Allergies  Allergen Reactions  . Iodine   . Tetracycline     Patient Measurements: Height: 5' 11"  (180.3 cm) Weight: 314 lb 9.5 oz (142.7 kg) IBW/kg (Calculated) : 75.3 Heparin Dosing Weight: 94.2 kg  Vital Signs: Temp: 97.6 F (36.4 C) (04/25 0742) Temp src: Oral (04/25 0742) BP: 110/78 mmHg (04/25 1000) Pulse Rate: 95 (04/25 0742)  Labs:  Recent Labs  11/22/13 1000 11/22/13 1645 11/22/13 2253 11/23/13 0141 11/23/13 0350  HGB 15.4  --   --   --  13.2  HCT 45.8  --   --   --  39.4  PLT 269  --   --   --  271  APTT 31  --   --   --   --   LABPROT 16.1*  --   --   --   --   INR 1.32  --   --   --   --   HEPARINUNFRC  --   --   --   --  <0.10*  CREATININE 1.16  --   --   --   --   TROPONINI  --  >20.00* >20.00* >20.00*  --     Estimated Creatinine Clearance: 95.5 ml/min (by C-G formula based on Cr of 1.16).   Medications:  Scheduled:  . amiodarone  200 mg Oral BID  . carvedilol  6.25 mg Oral BID WC  . furosemide  40 mg Intravenous BID  . insulin aspart  0-15 Units Subcutaneous TID WC  . isosorbide mononitrate  15 mg Oral Daily  . pneumococcal 23 valent vaccine  0.5 mL Intramuscular Tomorrow-1000  . rivaroxaban  20 mg Oral Q lunch    Assessment: 63 yo M admitted with chest pain and diagnosed with NSTEMI, now s/p DCCV for atrial flutter and cath on 4/24 where occlusion of LAD noted, though no PCI performed. Pharmacy has been consulted to convert patient from IV heparin to xarelto for atrial fibrillation. (Per MD notes, patient has a history of noncompliance with warfarin)  His CBC is stable, and no bleeding issues noted. His renal function is also stable (CrCl ~95 ml/min).   As Xarelto should be given with a meal, will schedule dose to be given at noon with lunch. At this time, the heparin drip will be stopped (end time for that order has  been entered and all corresponding labs have been discontinued). Instructions provided to nursing. Per MD request, aspirin therapy has also been discontinued.  Goal of Therapy:  Prevention of stroke in the setting of atrial fibrillation Proper anticoagulation dosing   Plan:  -Start Xarelto 81m PO daily at noon today -Stop heparin at time of xarelto administration. -D/c aspirin per MD request in note -provide Xarelto education prior to discharge  [place] [facility] [place] Lalor, PharmD Clinical Pharmacist-Resident Pager: [phone]: [phone]/25/2015 10:52 AM

## 2013-11-23 NOTE — Progress Notes (Signed)
  Echocardiogram 2D Echocardiogram has been performed.  Valinda Hoar 11/23/2013, 2:53 PM

## 2013-11-23 NOTE — Progress Notes (Signed)
ANTICOAGULATION CONSULT NOTE - Follow Up Consult  Pharmacy Consult for heparin Indication: NSTEMI and r/o LV apical thrombus  Labs:  Recent Labs  11/22/13 1000 11/22/13 1645 11/22/13 2253 11/23/13 0141 11/23/13 0350  HGB 15.4  --   --   --  13.2  HCT 45.8  --   --   --  39.4  PLT 269  --   --   --  271  APTT 31  --   --   --   --   LABPROT 16.1*  --   --   --   --   INR 1.32  --   --   --   --   HEPARINUNFRC  --   --   --   --  <0.10*  CREATININE 1.16  --   --   --   --   TROPONINI  --  >20.00* >20.00* >20.00*  --     Assessment: 63yo male undetectable on heparin resumed post-cath for possible apical thrombus.  Goal of Therapy:  Heparin level 0.3-0.7 units/ml   Plan:  Will increase heparin gtt by 3-4 units/kg/hr to 2300 units/hr and check level in 6hr.  Wynona Neat, PharmD, BCPS  11/23/2013,6:09 AM

## 2013-11-24 DIAGNOSIS — I4891 Unspecified atrial fibrillation: Secondary | ICD-10-CM

## 2013-11-24 LAB — BASIC METABOLIC PANEL
BUN: 43 mg/dL — AB (ref 6–23)
CALCIUM: 9.1 mg/dL (ref 8.4–10.5)
CO2: 23 mEq/L (ref 19–32)
CREATININE: 1.73 mg/dL — AB (ref 0.50–1.35)
Chloride: 92 mEq/L — ABNORMAL LOW (ref 96–112)
GFR calc Af Amer: 47 mL/min — ABNORMAL LOW (ref >90)
GFR calc non Af Amer: 41 mL/min — ABNORMAL LOW (ref >90)
GLUCOSE: 189 mg/dL — AB (ref 70–99)
POTASSIUM: 4.5 meq/L (ref 3.7–5.3)
Sodium: 132 mEq/L — ABNORMAL LOW (ref 137–147)

## 2013-11-24 LAB — GLUCOSE, CAPILLARY
Glucose-Capillary: 164 mg/dL — ABNORMAL HIGH (ref 70–99)
Glucose-Capillary: 161 mg/dL — ABNORMAL HIGH (ref 70–99)
Glucose-Capillary: 166 mg/dL — ABNORMAL HIGH (ref 70–99)
Glucose-Capillary: 145 mg/dL — ABNORMAL HIGH (ref 70–99)

## 2013-11-24 LAB — CBC
HCT: 40.8 % (ref 39.0–52.0)
Hemoglobin: 13.6 g/dL (ref 13.0–17.0)
MCH: 26.9 pg (ref 26.0–34.0)
MCHC: 33.3 g/dL (ref 30.0–36.0)
MCV: 80.6 fL (ref 78.0–100.0)
Platelets: 272 10*3/uL (ref 150–400)
RBC: 5.06 MIL/uL (ref 4.22–5.81)
RDW: 14.9 % (ref 11.5–15.5)
WBC: 21.1 10*3/uL — ABNORMAL HIGH (ref 4.0–10.5)

## 2013-11-24 MED ORDER — CARVEDILOL 6.25 MG PO TABS
6.2500 mg | ORAL_TABLET | Freq: Once | ORAL | Status: AC
  Filled 2013-11-24: qty 1

## 2013-11-24 MED ORDER — CARVEDILOL 12.5 MG PO TABS
12.5000 mg | ORAL_TABLET | Freq: Two times a day (BID) | ORAL | Status: DC
  Administered 2013-11-24 – 2013-11-30 (×12): 12.5 mg via ORAL
  Filled 2013-11-24 (×15): qty 1

## 2013-11-24 MED ORDER — HYDRALAZINE HCL 25 MG PO TABS
25.0000 mg | ORAL_TABLET | Freq: Three times a day (TID) | ORAL | Status: DC
Start: 2013-11-24 — End: 2013-11-26
  Administered 2013-11-24 – 2013-11-26 (×6): 25 mg via ORAL
  Filled 2013-11-24 (×10): qty 1

## 2013-11-24 MED ORDER — ISOSORBIDE MONONITRATE ER 30 MG PO TB24
30.0000 mg | ORAL_TABLET | Freq: Every day | ORAL | Status: DC
  Administered 2013-11-24 – 2013-11-30 (×7): 30 mg via ORAL
  Filled 2013-11-24 (×7): qty 1

## 2013-11-24 NOTE — Progress Notes (Signed)
EKG CRITICAL VALUE     12 lead EKG performed.  Critical value noted.  Johnsie Cancel, RN notified.   Evalee Mutton, CCT 11/24/2013 7:17 AM

## 2013-11-24 NOTE — Progress Notes (Signed)
Primary Cardiologist: Ida Rogue, M.D.  Subjective:  Overall doing well, no specific complaints. Yesterday, 4/24, he states that he woke up at around 12:30 AM with chest pain, left arm pain. The day before he was driving around with no difficulty. At the time of cardiac catheterization he was having no discomfort.  Cardiac catheterization 11/22/13 revealed occluded LAD stent. He was also cardioverted for atrial flutter on 4/24 prior to cardiac catheterization.  Mild shortness of breath this morning. Able to carry on conversation without any difficulty. He reports that he did not put out much urine yesterday.    Objective:  Vital Signs in the last 24 hours: Temp:  [96 F (35.6 C)-97.8 F (36.6 C)] 97 F (36.1 C) (04/26 0754) Pulse Rate:  [89-90] 90 (04/26 0406) Resp:  [15-28] 18 (04/26 0754) BP: (110-155)/(76-119) 151/118 mmHg (04/26 0754) SpO2:  [92 %-100 %] 98 % (04/26 0754) Weight:  [318 lb 5.5 oz (144.4 kg)] 318 lb 5.5 oz (144.4 kg) (04/26 0600)  Intake/Output from previous day: 04/25 0701 - 04/26 0700 In: 1727.2 [P.O.:1560; I.V.:167.2] Out: 950 [Urine:950]   Physical Exam: General: Well developed, well nourished, in no acute distress. Head:  Normocephalic and atraumatic. Mid neck JVD Lungs: Clear to auscultation and percussion. Normal respiratory effort distant breath sounds Heart: Normal S1 and S2. Distant heart sounds No murmur, rubs or gallops.  Abdomen: soft, non-tender, positive bowel sounds. Obese Extremities: No clubbing or cyanosis. 2+ lower extremity edema. Minor excoriations noted radial site intact normal pulse Neurologic: Alert and oriented x 3.    Lab Results:  Recent Labs  11/23/13 0350 11/24/13 0331  WBC 17.3* 21.1*  HGB 13.2 13.6  PLT 271 272    Recent Labs  11/22/13 1000 11/24/13 0331  NA 135* 132*  K 4.5 4.5  CL 95* 92*  CO2 17* 23  GLUCOSE 308* 189*  BUN 18 43*  CREATININE 1.16 1.73*    Recent Labs  11/22/13 2253  11/23/13 0141  TROPONINI >20.00* >20.00*   Hepatic Function Panel  Recent Labs  11/22/13 1000  PROT 7.6  ALBUMIN 3.2*  AST 214*  ALT 138*  ALKPHOS 191*  BILITOT 0.9    Recent Labs  11/23/13 0200  CHOL 131     Telemetry: No ventricular tachycardia, sinus rhythm 80s, no further atrial flutter Personally viewed.   EKG:  4/25-8:03 AM-1 mm ST segment elevation diffuse precordial leads as well as lateral leads with reciprocal ST segment depression in aVF and 3. No significant change on 4/26  Cardiac Studies:  Cardiac catheterization 4/25-Dr. Tamala Julian:  1. LAD stent occlusion, duration unknown. QS pattern across the precordium on EKG suggested this is not in acute.  2. Moderate distal right coronary obstruction and widely patent circumflex coronary artery  3. Severe left ventricular systolic function, decompensated with severe elevation in LVEDP. I suspect LV systolic dysfunction is mixed in etiology including the results of anterior infarction of unknown timing but presumed recent and/or a component of tachycardia induced LV dysfunction.  4. Atrial flutter of unknown duration  5. Non-ST elevation myocardial infarction, difficult to sort out relative contribution of demand related to tachycardia versus obstructive disease and timing of LAD occlusion. The patient is totally pain-free at this time. He states the chest pain started last night at midnight. He is not dyspneic and his blood pressure is controlled.  RECOMMENDATION: Aggressive medical therapy of LV systolic dysfunction  IV nitroglycerin to lower filling pressures  IV diuresis. A single dose of  Lasix 60 mg was given post cath.  IV heparin should be resumed until LV apical thrombus can't be excluded. An echocardiogram needs to be done as soon as possible.  Low-dose beta blocker therapy  Consider heart failure team consult  May need AICD therapy  I would suggest amiodarone to prevent recurrent atrial flutter  We'll need to  resume long-term anticoagulation at some point during this hospital stay  Discussed medication compliance.   Assessment/Plan:   63 year old male with non-ST elevation myocardial infarction, troponin greater than 20, paroxysmal atrial flutter status post cardioversion on 4/24 urgently prior to heart catheterization with severe LV systolic dysfunction, acute systolic heart failure.  1. Non-ST elevation myocardial infarction-highly elevated troponin. Occluded LAD stent of unknown duration. No PCI performed. Severe LV systolic dysfunction EF 18%. Continue to optimize medications. -Increase carvedilol to 12.5 mg twice a day -Start hydralazine 25 mg 3 times a day and Imdur 30 mg -Discontinued diltiazem in the setting of severe LV systolic dysfunction -Heparin IV transition to Xarelto.  -Holding off on ACE inhibitor given creatinine of 1.7  2. Atrial flutter -Xarelto (encourage compliance) (stop aspirin and IV heparin once starting this medication, pharmacy to assist) -Cardioversion 4/24 -We'll place him on amiodarone 200 PO BID gentle load to help keep him out of atrial flutter. He would be optimal for him to be on another antiarrhythmic given his age Other possibility includes Tikosyn. I'm concerned about medical compliance however and he would need to demonstrate this more vigorously prior to proceeding with this medication. If atrial flutter returns, could consider ablation. -In the past, he has failed compliance with Coumadin.  3. 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 -Cannot fully exclude LV thrombus even with Definity contrast in the apical lateral segment. Nevertheless, continuing with anticoagulation secondary to atrial flutter. -If ejection fraction does not improve over the next several weeks, ICD. EF  20% -Lasix 40 mg IV twice a day - +1.7 L according to I:O.  -Creatinine increase could be multifactorial including decreased cardiac output secondary to a reduced ejection fraction, IV contrast, further attempts at diuresis. May need further adjustments tomorrow. Trying to avoid inotropic therapy with recent episode of atrial flutter and myocardial infarction. Continue with afterload reduction.  4. Morbid obesity-encourage weight loss  5. Coronary artery disease-occluded LAD stent. Unknown duration. No PCI performed on 4/24 by Dr. Tamala Julian. EKG demonstrates continued ST segment changes. Optimize medical management.  Complex medical issues. Increased mortality with recent heart attack, ejection fraction 20-25%.  We will transfer to 3 E. Would recommend advanced heart failure consultation on Monday.   Candee Furbish 11/24/2013, 9:45 AM

## 2013-11-25 DIAGNOSIS — E785 Hyperlipidemia, unspecified: Secondary | ICD-10-CM

## 2013-11-25 DIAGNOSIS — Z9189 Other specified personal risk factors, not elsewhere classified: Secondary | ICD-10-CM | POA: Diagnosis present

## 2013-11-25 DIAGNOSIS — I2510 Atherosclerotic heart disease of native coronary artery without angina pectoris: Secondary | ICD-10-CM

## 2013-11-25 DIAGNOSIS — I255 Ischemic cardiomyopathy: Secondary | ICD-10-CM | POA: Diagnosis present

## 2013-11-25 DIAGNOSIS — I5043 Acute on chronic combined systolic (congestive) and diastolic (congestive) heart failure: Secondary | ICD-10-CM

## 2013-11-25 DIAGNOSIS — I509 Heart failure, unspecified: Secondary | ICD-10-CM

## 2013-11-25 DIAGNOSIS — I5041 Acute combined systolic (congestive) and diastolic (congestive) heart failure: Secondary | ICD-10-CM

## 2013-11-25 DIAGNOSIS — I2589 Other forms of chronic ischemic heart disease: Secondary | ICD-10-CM

## 2013-11-25 DIAGNOSIS — Z789 Other specified health status: Secondary | ICD-10-CM

## 2013-11-25 DIAGNOSIS — Z9861 Coronary angioplasty status: Secondary | ICD-10-CM

## 2013-11-25 LAB — CBC
HCT: 42.5 % (ref 39.0–52.0)
Hemoglobin: 14 g/dL (ref 13.0–17.0)
MCH: 26.6 pg (ref 26.0–34.0)
MCHC: 32.9 g/dL (ref 30.0–36.0)
MCV: 80.6 fL (ref 78.0–100.0)
PLATELETS: 300 10*3/uL (ref 150–400)
RBC: 5.27 MIL/uL (ref 4.22–5.81)
RDW: 14.9 % (ref 11.5–15.5)
WBC: 15.1 10*3/uL — ABNORMAL HIGH (ref 4.0–10.5)

## 2013-11-25 LAB — BASIC METABOLIC PANEL
BUN: 44 mg/dL — AB (ref 6–23)
CALCIUM: 9.2 mg/dL (ref 8.4–10.5)
CO2: 27 mEq/L (ref 19–32)
CREATININE: 1.55 mg/dL — AB (ref 0.50–1.35)
Chloride: 91 mEq/L — ABNORMAL LOW (ref 96–112)
GFR calc non Af Amer: 46 mL/min — ABNORMAL LOW (ref >90)
GFR, EST AFRICAN AMERICAN: 54 mL/min — AB (ref >90)
Glucose, Bld: 152 mg/dL — ABNORMAL HIGH (ref 70–99)
POTASSIUM: 4 meq/L (ref 3.7–5.3)
Sodium: 130 mEq/L — ABNORMAL LOW (ref 137–147)

## 2013-11-25 LAB — GLUCOSE, CAPILLARY
Glucose-Capillary: 161 mg/dL — ABNORMAL HIGH (ref 70–99)
Glucose-Capillary: 142 mg/dL — ABNORMAL HIGH (ref 70–99)
Glucose-Capillary: 133 mg/dL — ABNORMAL HIGH (ref 70–99)
Glucose-Capillary: 154 mg/dL — ABNORMAL HIGH (ref 70–99)

## 2013-11-25 MED ORDER — PERFLUTREN LIPID MICROSPHERE
1.0000 mL | INTRAVENOUS | Status: AC | PRN
  Administered 2013-11-25: 2 mL via INTRAVENOUS
  Filled 2013-11-25: qty 10

## 2013-11-25 MED ORDER — ATORVASTATIN CALCIUM 40 MG PO TABS
40.0000 mg | ORAL_TABLET | Freq: Every day | ORAL | Status: DC
  Administered 2013-11-25 – 2013-11-29 (×5): 40 mg via ORAL
  Filled 2013-11-25 (×6): qty 1

## 2013-11-25 NOTE — Progress Notes (Signed)
Brief HF rounding note:  We were asked to consult on Colton Lamb. He is a 63 yo who presented to the hospital with NSTEMI and was taken to the cath lab which showed totally occluded LAD proximally within previously placed stent. Left Ventriculogram showed LVEF 20% with possible LV thrombus. No PCI and medically managed.  He was also cardioverted for atrial flutter.   He remains volume overloaded. Cr improving, however still elevated from admission. Will increase lasix to 80 mg BID and continue to follow. May need to have RHC to assess hemodynamics. Continue BMET daily. Continue current BB. Not on an ACE-I currently will not add at this time, but will try in the next day or two.   Cardiac rehab to work with patient while in the [place].  Will follow in the hospital.  Rande Brunt NP-C 12:45 PM  Patient seen with NP, agree with the above note.  Patient remains volume overloaded on exam, will increase Lasix to 80 mg IV bid today.  Continue other meds.  Will not add ACEI yet, wait for creatinine to stabilize.    There is some question of LV thrombus on echo but not definitively seen.  If there is an LV thrombus, coumadin would be indicated rather than Xarelto.  Will get limited echo with contrast to assess for thrombus.  If no thrombus, continue Xarelto for atrial flutter.   Larey Dresser 11/25/2013 1:59 PM

## 2013-11-25 NOTE — Progress Notes (Signed)
Patient Name: Colton Lamb Date of Encounter: 11/25/2013     Active Problems:   CAD S/P percutaneous coronary angioplasty - multiple PCIs   NSTEMI (non-ST elevated myocardial infarction)   At risk for sudden cardiac death   Acute combined systolic and diastolic CHF, NYHA class 3 -- 2/2 MI   Cardiomyopathy, ischemic   Atrial flutter    SUBJECTIVE  Still with SOB and orthopnea. But feeling better with diuresis.  CURRENT MEDS . amiodarone  200 mg Oral BID  . carvedilol  12.5 mg Oral BID WC  . furosemide  40 mg Intravenous BID  . hydrALAZINE  25 mg Oral 3 times per day  . insulin aspart  0-15 Units Subcutaneous TID WC  . isosorbide mononitrate  30 mg Oral Daily  . rivaroxaban  20 mg Oral Q lunch    OBJECTIVE  Filed Vitals:   11/24/13 1439 11/24/13 2123 11/24/13 2346 11/25/13 0543  BP: 116/83 107/83 121/70 125/92  Pulse: 90 81 94 84  Temp:  97.2 F (36.2 C) 97.7 F (36.5 C) 97.9 F (36.6 C)  TempSrc:  Oral Oral Oral  Resp: 20 18 18 18   Height: 5' 11"  (1.803 m)     Weight: 319 lb (144.697 kg)   316 lb 14.4 oz (143.745 kg)  SpO2: 94% 98% 100% 99%    Intake/Output Summary (Last 24 hours) at 11/25/13 1246 Last data filed at 11/25/13 0933  Gross per 24 hour  Intake   1200 ml  Output   2650 ml  Net  -1450 ml   Filed Weights   11/24/13 0600 11/24/13 1439 11/25/13 0543  Weight: 318 lb 5.5 oz (144.4 kg) 319 lb (144.697 kg) 316 lb 14.4 oz (143.745 kg)    PHYSICAL EXAM  General: Pleasant, NAD. Neuro: Alert and oriented X 3. Moves all extremities spontaneously. Psych: Normal affect. HEENT:  Normal  Neck: Supple without bruits or + JVD at jawline. Lungs:  Resp regular and unlabored, CTA. Heart: RRR no s3, s4, or murmurs. Distant heart sounds No murmur, rubs or gallops. Abdomen: Soft, non-tender, non-distended, BS + x 4. obese Extremities: . 2+ lower extremity edema. Minor excoriations noted radial site intact normal pulse   Accessory Clinical  Findings  CBC  Recent Labs  11/24/13 0331 11/25/13 0505  WBC 21.1* 15.1*  HGB 13.6 14.0  HCT 40.8 42.5  MCV 80.6 80.6  PLT 272 962   Basic Metabolic Panel  Recent Labs  11/24/13 0331 11/25/13 0505  NA 132* 130*  K 4.5 4.0  CL 92* 91*  CO2 23 27  GLUCOSE 189* 152*  BUN 43* 44*  CREATININE 1.73* 1.55*  CALCIUM 9.1 9.2   Liver Function Tests  Recent Labs  11/23/13 0200  AST 597*  ALT 344*  ALKPHOS 138*  BILITOT 0.6  PROT 6.9  ALBUMIN 2.9*    Cardiac Enzymes  Recent Labs  11/22/13 1645 11/22/13 2253 11/23/13 0141  TROPONINI >20.00* >20.00* >20.00*    Fasting Lipid Panel  Recent Labs  11/23/13 0200  CHOL 131  HDL 41  LDLCALC 79  TRIG 54  CHOLHDL 3.2    TELE  NSR  Radiology/Studies  No results found.  ASSESSMENT AND PLAN  63 year old male with with a hx of OSA, CAD s/p stenting x3 in LAD, RCA and Lcx ~2007/2008, and obesity who presented on 11/22/13 with chest pain. Found to have NSTEMI, troponin > 20 and paroxysmal atrial flutter. He was cardioverted on 4/24 urgently prior to heart catheterization  with severe LV systolic dysfunction and acute systolic heart failure.   NSTEMI, troponin > 20. Occluded LAD stent of unknown duration. No PCI performed. Severe LV systolic dysfunction EF 60%. Continue to optimize medications.  -- Carvedilol Increased to 12.5 mg BID yesterday -- Started on hydralazine 25 mg 3 times a day and Imdur 30 mg  -- Discontinued diltiazem in the setting of severe LV systolic dysfunction  -- Heparin IV transitioned to Xarelto.  -- Holding off on ACE inhibitor given creatinine of 1.55  Atrial flutter -s/p cardioversion 4/24  -- Xarelto. ASA and heparin D/Cd -- Placed on amiodarone 200 PO BID gentle load to help keep him out of atrial flutter.  Acute systolic heart failure/ischemic or possible tachycardia induced cardiomyopathy  -- Carvedilol 12.5 mg twice a day, increased  -- Isosorbide 30  -- Hydralazine added  -- No  ACE inhibitor because of creatinine 1.55  -- If ejection fraction does not improve over the next several weeks, ICD. EF 20%  -- Lasix 40 mg IV twice a day - +331 mL according to I:O.  -- Creatinine improved from yesterday 1.73--> 1.55. Increase could be multifactorial including decreased cardiac output secondary to a reduced ejection fraction, IV contrast, further attempts at diuresis.  Trying to avoid inotropic therapy with recent episode of atrial flutter and myocardial infarction. Continue with afterload reduction.  -- Advanced heart failure to consult tomorrow AM  Morbid obesity-encourage weight loss   Coronary artery disease-occluded LAD stent. Unknown duration. No PCI performed on 4/24 by Dr. Tamala Julian. EKG demonstrates continued ST segment changes. Optimize medical management.   Signed, Perry Mount PA-C  Pager [phone]  ATTENDING ATTESTATION  I have seen and evaluated the patient this AM along with Perry Mount, PA-C . I agree with her findings, examination as well as impression recommendations.  Known CAD-PCI admitted with what appears to be subacute occlusion of LAD stent -- no PCI.  Ef notably worse (EF ~20%), not unexpectedly.  Grade 3 diastolic dysfunction with mod Conc LVH.  Was in [place] - now in NSR s/p DCCV on Amio & BB.  On Amio 200 mg BID for continued load.  Feels overall better today, but still SOB moving about the room.  Edema still ~2.    Renal Fnxn improving as hoped with improved Cardiac output (restorign Frank-Starling curve).   NO longer on IV NTG - on Imdur & Hydralazine for afterload reduction (given recent A on C RF - no ACE-I or ARB). ON stable dose of BB - with BP & HR stable, would not titrate further for now.   Continue IV diuresis as he is clearly volume overloaded.   Agree with advanced CHF service consult for furhter input. -- will eventually need ICD; he is at high risk for sudden cardiac death -- would strongly consider LifeVest (mau re-cehck  Echo prior to d/c)  On Xarelto for Afib. No currently on ASA -- defer to primary cardiolgist. Not currently on statin -- will add atorvastatin  Leonie Man, M.D., M.S. Interventional Cardiologist  [place] Pager # [phone] 11/25/2013

## 2013-11-25 NOTE — Progress Notes (Signed)
CARDIAC REHAB PHASE I   PRE:  Rate/Rhythm: 88 SR  BP:  Supine:   Sitting: 100/70  Standing:    SaO2: 97 RA  MODE:  Ambulation: 380 ft   POST:  Rate/Rhythm: 91  BP:  Supine:   Sitting: 118/80  Standing:    SaO2: 99 RA 1345-1425 Assisted X 1 and used walker to ambulate. Gait steady with walker. Pt c/o of some SOB with walking. RA sat in hall 97%. Pt was able to walk 380 feet. VS stable Pt back to recliner after walk. Gave pt CHF packet and attempted to put CHF video on him to watch. Video system not working. We discussed life vest and ICD.We will follow pt tomorrow.  Rodney Langton RN 11/25/2013 2:27 PM

## 2013-11-25 NOTE — Progress Notes (Signed)
  Echocardiogram 2D Echocardiogram (limtied with Definity) has been performed.  Colton Lamb 11/25/2013, 3:27 PM

## 2013-11-25 NOTE — Progress Notes (Addendum)
Advanced Heart Failure Rounding Note  PCP: Mendel Ryder, MD Primary Cardiologist: Dr. Rockey Situ   Subjective:    Colton Lamb is a 63 year old gentleman with a history of paroxysmal atrial fibrillation, last episode in 2008 per the patient, hyperlipidemia and coronary artery disease s/p PCI x3 to the LAD, circumflex, PDA and PL branch (in stent restenosis on little RCA and tx with cyper stent to OM).   Presented to the hospital with NSTEMI. He was found to be in A-flutter and was started on cardizem and heparin gtt. Pertinent labs on admission Troponin 17, Hgb A1C 7.9, K+ 4.5 and Cr 1.16. Underwent DC-CV which was successful.   LHC/ LEFT VENTRICULOGRAM -Aortic pressure 120/93, LV pressur 126/44, LVEDP 46 -L main widely patent -LAD totally occluded promially within previously placed stent -LCx widely patent -RCA co-dominant; distal RCA leading into PDA contains segmental 70% stenosis - Dilated LV and and severe dysfunction, estimate LVEF 20% or less, possible LV thrombus  ECHO (10/2013): EF 20-25%, diff HK, grade III DD, RV mildly dilated and sys fx mild/mod reduced.  Started on IV nitro and given IV lasix and then transitioned to Imdur. Lasix increased to 80 mg IV BID yesterday. 24 hr I/O -3 liters and weight down 2 lbs. Ambulating with CR. Denies SOB or CP. +orthopnea.  Cr trending down 1.39. WBC 16.2.  Limited ECHO yesterday with definity and no conclusive evidence of LV thrombus.  Objective:   Weight Range:  Vital Signs:   Temp:  [97.2 F (36.2 C)-97.9 F (36.6 C)] 97.9 F (36.6 C) (04/27 0543) Pulse Rate:  [81-94] 84 (04/27 0543) Resp:  [18-20] 18 (04/27 0543) BP: (107-132)/(70-100) 125/92 mmHg (04/27 0543) SpO2:  [94 %-100 %] 99 % (04/27 0543) Weight:  [316 lb 14.4 oz (143.745 kg)-319 lb (144.697 kg)] 316 lb 14.4 oz (143.745 kg) (04/27 0543) Last BM Date: 11/24/13  Weight change: Filed Weights   11/24/13 0600 11/24/13 1439 11/25/13 0543  Weight: 318 lb 5.5 oz (144.4 kg)  319 lb (144.697 kg) 316 lb 14.4 oz (143.745 kg)    Intake/Output:   Intake/Output Summary (Last 24 hours) at 11/25/13 1112 Last data filed at 11/25/13 0933  Gross per 24 hour  Intake   1200 ml  Output   2650 ml  Net  -1450 ml     Physical Exam: General:  Well appearing. No resp difficulty, sitting in chair HEENT: normal Neck: supple. JVP difficult to assess d/t body habitus but appears elevated . Carotids 2+ bilat; no bruits. No lymphadenopathy or thryomegaly appreciated. Cor: PMI nondisplaced. Distant heart sounds. Regular rate & rhythm. No rubs, gallops or murmurs. Lungs: clear Abdomen: soft, nontender, + distended. No hepatosplenomegaly. No bruits or masses. Good bowel sounds. Extremities: no cyanosis, clubbing, rash, 2+ bilateral edema Neuro: alert & orientedx3, cranial nerves grossly intact. moves all 4 extremities w/o difficulty. Affect pleasant  Telemetry: SR 80s Labs: Basic Metabolic Panel:  Recent Labs Lab 11/22/13 1000 11/23/13 0200 11/24/13 0331 11/25/13 0505  NA 135* 132* 132* 130*  K 4.5 4.4 4.5 4.0  CL 95* 94* 92* 91*  CO2 17* 17* 23 27  GLUCOSE 308* 273* 189* 152*  BUN 18 24* 43* 44*  CREATININE 1.16 1.29 1.73* 1.55*  CALCIUM 8.9 8.9 9.1 9.2    Liver Function Tests:  Recent Labs Lab 11/22/13 1000 11/23/13 0200  AST 214* 597*  ALT 138* 344*  ALKPHOS 191* 138*  BILITOT 0.9 0.6  PROT 7.6 6.9  ALBUMIN 3.2* 2.9*   No  results found for this basename: LIPASE, AMYLASE,  in the last 168 hours No results found for this basename: AMMONIA,  in the last 168 hours  CBC:  Recent Labs Lab 11/22/13 1000 11/23/13 0350 11/24/13 0331 11/25/13 0505  WBC 24.0* 17.3* 21.1* 15.1*  HGB 15.4 13.2 13.6 14.0  HCT 45.8 39.4 40.8 42.5  MCV 81.1 80.1 80.6 80.6  PLT 269 271 272 300    Cardiac Enzymes:  Recent Labs Lab 11/22/13 0810 11/22/13 1645 11/22/13 2253 11/23/13 0141  TROPONINI 17.21* >20.00* >20.00* >20.00*    BNP: BNP (last 3 results) No  results found for this basename: PROBNP,  in the last 8760 hours   Imaging:  No results found.   Medications:    Scheduled Meds: . amiodarone  200 mg Oral BID  . atorvastatin  40 mg Oral q1800  . carvedilol  12.5 mg Oral BID WC  . furosemide  40 mg Intravenous Once  . furosemide  80 mg Intravenous BID  . hydrALAZINE  25 mg Oral 3 times per day  . insulin aspart  0-15 Units Subcutaneous TID WC  . isosorbide mononitrate  30 mg Oral Daily  . rivaroxaban  20 mg Oral Q lunch   Continuous Infusions:  PRN Meds:.acetaminophen, ALPRAZolam, ondansetron (ZOFRAN) IV, zolpidem   Assessment/Plan/Discussion:    1) NSTEMI - Presented to the hospital with CP and Troponin 17. Taken for [facility] which showed LAD totally occluded proximally within previously placed stent. Medical management.  Continue BB. He currently is now on Xarelto. Statin started yesterday. 2) Atrial flutter - s/p Cardioversion 11/22/13 - Now on Xarelto. Will continue amiodarone 200 mg BID. 3) A/C systolic HF - ECHO showed EF 20-25%, diff HK, grade III DD, RV mildly dilated and sys fx mild/mod reduced. Previous ECHO 2008 EF 45-50%. He reports he was not taking his medications for at least 3 weeks before being admitted and had progressive SOB, LE edema and orthopnea. - Still remains volume overloaded will continue lasix 80 mg IV BID and continue to watch renal function. Concerned with right side he may need a little milrinone to help facilitate diuresis. - SBP 100-120s will start low dose lisinopril 2.5 mg daily and spironolactone 12.5 mg daily.  Follow creatinine and K.   - Will titrate down on hydralazine to 12.5 mg tid as we add ACEI.   - Continue Coreg at current dose - continue to work with CR - He may need a RHC before d/c to assess hemodynamics, but will hold off for now.  4) CAD - occluded LAD stent unknown duration. No PCI performed on 4/24. Continue medical management - He will on aspirin 81 and Xarelto for the time  being.  Would not continue Plavix (avoid triple therapy).  5) ? LV thrombus - Limited ECHO yesterday with Defininty and no conclusive evidence of LV thrombus will continue Xarelto for Afib.  6) Elevated WBC - Will get urinalysis and urine cx.  7) ?OSA - Reports that he used CPAP in the past. Will likely need another sleep study on OP side and be re-set up with CPAP.   Length of Stay: 3   Rande Brunt 11/25/2013, 11:12 AM  Advanced Heart Failure Team Pager [phone] (M-F; 7a - 4p)  Please contact [facility] for night-coverage after hours (4p -7a ) and weekends on [email or web address]  Patient seen with NP, agree with the above note.    No evidence for LV thrombus on limited echo with Definity, so will continue  Xarelto for atrial flutter/fib.   LAD occlusion with collaterals, plan for medical management. Non-revascularized MI with low EF: will repeat echo at 6 wks, if EF remains low will need ICD. QRS is not wide.  He will need Lifevest at discharge.  Still volume overloaded.  Lasix 80 mg IV bid.  Will add lisinopril 2.5 mg daily and spironolactone 12.5 mg daily.  Cut back hydralazine to 12.5 mg tid with goal to increase lisinopril and stop hydralazine eventually.  Can restart digoxin 0.125 mg daily digoxin (he was on at home).   Larey Dresser 11/26/2013 8:54 AM

## 2013-11-26 LAB — BASIC METABOLIC PANEL
BUN: 39 mg/dL — ABNORMAL HIGH (ref 6–23)
CALCIUM: 9.5 mg/dL (ref 8.4–10.5)
CO2: 27 meq/L (ref 19–32)
Chloride: 94 mEq/L — ABNORMAL LOW (ref 96–112)
Creatinine, Ser: 1.39 mg/dL — ABNORMAL HIGH (ref 0.50–1.35)
GFR calc non Af Amer: 53 mL/min — ABNORMAL LOW (ref >90)
GFR, EST AFRICAN AMERICAN: 61 mL/min — AB (ref >90)
Glucose, Bld: 142 mg/dL — ABNORMAL HIGH (ref 70–99)
Potassium: 4.2 mEq/L (ref 3.7–5.3)
SODIUM: 136 meq/L — AB (ref 137–147)

## 2013-11-26 LAB — CBC
HCT: 43.6 % (ref 39.0–52.0)
Hemoglobin: 14.7 g/dL (ref 13.0–17.0)
MCH: 27 pg (ref 26.0–34.0)
MCHC: 33.7 g/dL (ref 30.0–36.0)
MCV: 80.1 fL (ref 78.0–100.0)
Platelets: 324 10*3/uL (ref 150–400)
RBC: 5.44 MIL/uL (ref 4.22–5.81)
RDW: 15.1 % (ref 11.5–15.5)
WBC: 16.2 10*3/uL — AB (ref 4.0–10.5)

## 2013-11-26 LAB — GLUCOSE, CAPILLARY
Glucose-Capillary: 143 mg/dL — ABNORMAL HIGH (ref 70–99)
Glucose-Capillary: 149 mg/dL — ABNORMAL HIGH (ref 70–99)
Glucose-Capillary: 108 mg/dL — ABNORMAL HIGH (ref 70–99)
Glucose-Capillary: 211 mg/dL — ABNORMAL HIGH (ref 70–99)

## 2013-11-26 MED ORDER — SPIRONOLACTONE 12.5 MG HALF TABLET
12.5000 mg | ORAL_TABLET | Freq: Every day | ORAL | Status: DC
  Administered 2013-11-26 – 2013-11-30 (×5): 12.5 mg via ORAL
  Filled 2013-11-26 (×5): qty 1

## 2013-11-26 MED ORDER — FUROSEMIDE 10 MG/ML IJ SOLN
80.0000 mg | Freq: Two times a day (BID) | INTRAMUSCULAR | Status: DC
  Administered 2013-11-26 – 2013-11-30 (×8): 80 mg via INTRAVENOUS
  Filled 2013-11-26 (×10): qty 8

## 2013-11-26 MED ORDER — HYDRALAZINE HCL 25 MG PO TABS
12.5000 mg | ORAL_TABLET | Freq: Three times a day (TID) | ORAL | Status: DC
  Administered 2013-11-26 – 2013-11-28 (×6): 12.5 mg via ORAL
  Filled 2013-11-26 (×9): qty 0.5

## 2013-11-26 MED ORDER — MESALAMINE 800 MG PO TBEC
1.0000 | DELAYED_RELEASE_TABLET | Freq: Three times a day (TID) | ORAL | Status: DC
  Filled 2013-11-26 (×4): qty 1

## 2013-11-26 MED ORDER — FUROSEMIDE 10 MG/ML IJ SOLN
80.0000 mg | Freq: Once | INTRAMUSCULAR | Status: AC
  Administered 2013-11-26: 80 mg via INTRAVENOUS

## 2013-11-26 MED ORDER — DIGOXIN 125 MCG PO TABS
0.1250 mg | ORAL_TABLET | Freq: Every day | ORAL | Status: DC
  Filled 2013-11-26: qty 1

## 2013-11-26 MED ORDER — DIGOXIN 125 MCG PO TABS
0.1250 mg | ORAL_TABLET | Freq: Every day | ORAL | Status: DC
  Administered 2013-11-26 – 2013-11-30 (×5): 0.125 mg via ORAL
  Filled 2013-11-26 (×5): qty 1

## 2013-11-26 MED ORDER — FUROSEMIDE 10 MG/ML IJ SOLN: 40.0000 mg | Freq: Once | INTRAMUSCULAR | Status: DC

## 2013-11-26 MED ORDER — MESALAMINE 400 MG PO CPDR
800.0000 mg | DELAYED_RELEASE_CAPSULE | Freq: Three times a day (TID) | ORAL | Status: DC
  Administered 2013-11-26 – 2013-11-30 (×14): 800 mg via ORAL
  Filled 2013-11-26 (×18): qty 2

## 2013-11-26 MED ORDER — LISINOPRIL 2.5 MG PO TABS
2.5000 mg | ORAL_TABLET | Freq: Every day | ORAL | Status: DC
  Administered 2013-11-26 – 2013-11-27 (×2): 2.5 mg via ORAL
  Filled 2013-11-26 (×3): qty 1

## 2013-11-26 MED ORDER — LIVING WELL WITH DIABETES BOOK
Freq: Once | Status: AC
  Administered 2013-11-26: 18:00:00
  Filled 2013-11-26: qty 1

## 2013-11-26 MED ORDER — ASPIRIN 81 MG PO CHEW
81.0000 mg | CHEWABLE_TABLET | Freq: Every day | ORAL | Status: DC
  Administered 2013-11-26 – 2013-11-30 (×5): 81 mg via ORAL
  Filled 2013-11-26 (×5): qty 1

## 2013-11-26 NOTE — Progress Notes (Signed)
UR completed Devota Viruet K. Kenslee Achorn, RN, BSN, [place], CCM  11/26/2013 2:50 PM

## 2013-11-26 NOTE — Care Management Note (Addendum)
  Page 1 of 1   11/26/2013     3:31:03 PM CARE MANAGEMENT NOTE 11/26/2013  Patient:  Colton Lamb, Colton Lamb   Account Number:  [id number]  Date Initiated:  11/26/2013  Documentation initiated by:  Mariann Laster  Subjective/Objective Assessment:   Admitted with CP, wide complex tachycardia, a-fib, acute on chronic HF, NSTEMI, Cath & Cardioversion 11/22/2013.     Action/Plan:   CM to follow for disposition needs   Anticipated DC Date:  11/28/2013   Anticipated DC Plan:  [place]  CM consult      Choice offered to / List presented to:             Status of service:  In process, will continue to follow Medicare Important Message given?   (If response is "NO", the following Medicare IM given date fields will be blank) Date Medicare IM given:   Date Additional Medicare IM given:    Discharge Disposition:    Per UR Regulation:  Reviewed for med. necessity/level of care/duration of stay  If discussed at [place] of Stay Meetings, dates discussed:    Comments:  11/26/2013 CM Consult Note: Admitted with CP, wide complex tachycardia, a-fib, acute on chronic HF, NSTEMI, Cath & Cardioversion 11/22/2013. Intensity of Service: IV Lasix 80 bid Social:  From home / widowed since 2013. Hx/o CPap but not working and has not used since approximatley 2013. Medications:  using a medication box and reports recent non-compliance with taking meds.  States realization that he better understands the importance of taking his meds since this admission. Disposition Plan: Sleep Study Appointment: [place] [phone] Appointment:  01/05/2014 -[place] will mail documents to patient for completion prior to appointment. [address] RN, BSN, [place], CCM [phone] 11/26/2013

## 2013-11-26 NOTE — Progress Notes (Signed)
CARDIAC REHAB PHASE I   PRE:  Rate/Rhythm: 83 SR  BP:  Supine:   Sitting: 100/70  Standing:    SaO2: 97 RA  MODE:  Ambulation: 460 ft   POST:  Rate/Rhythm:   BP:  Supine:   Sitting: 106/74  Standing:    SaO2: 98 RA 0940-1030 Assisted X 1 and use walker to ambulate. Gait steady with walker. Pt is DOE, but RA sat after walk 98%, Pt is deconditioned. Pt back to recliner after walk with call light in reach. Completed CHF education with pt. We discussed CHF signs and symptoms, daily weights, sodium and fluid restrictions and when to call MD and 911. Pt agrees to [place]. CRP in [place], will send referral. Placed CHF video for pt to watch and encouraged him to watch life vest one next.  Rodney Langton RN 11/26/2013 10:30 AM

## 2013-11-26 NOTE — Progress Notes (Signed)
Inpatient Diabetes Program Recommendations  AACE/ADA: New Consensus Statement on Inpatient Glycemic Control (2013)  Target Ranges:  Prepandial:   less than 140 mg/dL      Peak postprandial:   less than 180 mg/dL (1-2 hours)      Critically ill patients:  140 - 180 mg/dL   Reason for Visit: By request of Rodney Langton, RN with Cardiac Rehab  Diabetes history: Apparently has type 2 diabetes-- A1C of 7.9 Outpatient Diabetes medications: Took Metformin 500 mg daily for 30 days-- not clear when-- Thought he just had an isolated instance of elevated blood glucose Current orders for Inpatient glycemic control: Novolog correction moderate scale tid  Note:  Visited patient in room.  In trying to discuss his diabetes, he spoke more about his deceased wife than himself-- stating that she had diabetes until she lost a lot of weight.  He also recounted details of her last illness and death.  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  I listened in an effort to offer support.      He did not think he was to take Metformin beyond one month as he thought he was being treated for an "isolated incidence" of elevated glucose.  Now that A1C is 7.9, discussed with him that he probably has diabetes and it needs to be controlled.  He wants to be sure to take care of his heart and I told him that the diabetes control is crucial in having a healthy heart.    Agreeable to having a hospital dietitian see him.  Agreeable to receiving the Living with Diabetes booklet.  Instructed him regarding accessing the Patient Education Network to watch diabetes-related videos as well as the Life Vest video.  Patient agreeable to outpatient diabetes instruction at the [facility] in [place] after discharge.  Patient states that he knows he need to care for himself better and is willing to learn how to do so.  Thank you.  Clevie Prout S. Marcelline Mates, RN, CNS, CDE Inpatient Diabetes Program, team pager [phone]

## 2013-11-27 LAB — GLUCOSE, CAPILLARY
Glucose-Capillary: 134 mg/dL — ABNORMAL HIGH (ref 70–99)
Glucose-Capillary: 117 mg/dL — ABNORMAL HIGH (ref 70–99)
Glucose-Capillary: 134 mg/dL — ABNORMAL HIGH (ref 70–99)
Glucose-Capillary: 113 mg/dL — ABNORMAL HIGH (ref 70–99)

## 2013-11-27 LAB — URINALYSIS, ROUTINE W REFLEX MICROSCOPIC
Bilirubin Urine: NEGATIVE
Glucose, UA: NEGATIVE mg/dL
HGB URINE DIPSTICK: NEGATIVE
Ketones, ur: NEGATIVE mg/dL
LEUKOCYTES UA: NEGATIVE
Nitrite: NEGATIVE
Protein, ur: NEGATIVE mg/dL
SPECIFIC GRAVITY, URINE: 1.01 (ref 1.005–1.030)
UROBILINOGEN UA: 1 mg/dL (ref 0.0–1.0)
pH: 6.5 (ref 5.0–8.0)

## 2013-11-27 LAB — BASIC METABOLIC PANEL
BUN: 33 mg/dL — ABNORMAL HIGH (ref 6–23)
CALCIUM: 8.9 mg/dL (ref 8.4–10.5)
CO2: 31 mEq/L (ref 19–32)
Chloride: 96 mEq/L (ref 96–112)
Creatinine, Ser: 1.47 mg/dL — ABNORMAL HIGH (ref 0.50–1.35)
GFR, EST AFRICAN AMERICAN: 57 mL/min — AB (ref >90)
GFR, EST NON AFRICAN AMERICAN: 49 mL/min — AB (ref >90)
GLUCOSE: 131 mg/dL — AB (ref 70–99)
Potassium: 3.7 mEq/L (ref 3.7–5.3)
SODIUM: 140 meq/L (ref 137–147)

## 2013-11-27 LAB — CBC
HEMATOCRIT: 40.8 % (ref 39.0–52.0)
HEMOGLOBIN: 13.5 g/dL (ref 13.0–17.0)
MCH: 26.8 pg (ref 26.0–34.0)
MCHC: 33.1 g/dL (ref 30.0–36.0)
MCV: 81.1 fL (ref 78.0–100.0)
Platelets: 294 10*3/uL (ref 150–400)
RBC: 5.03 MIL/uL (ref 4.22–5.81)
RDW: 15.5 % (ref 11.5–15.5)
WBC: 14.9 10*3/uL — AB (ref 4.0–10.5)

## 2013-11-27 NOTE — Progress Notes (Signed)
Advanced Heart Failure Rounding Note  PCP: Mendel Ryder, MD Primary Cardiologist: Dr. Rockey Situ   Subjective:    Colton Lamb is a 63 year old gentleman with a history of paroxysmal atrial fibrillation, last episode in 2008 per the patient, hyperlipidemia and coronary artery disease s/p PCI x3 to the LAD, circumflex, PDA and PL branch (in stent restenosis on little RCA and tx with cyper stent to OM).   Presented to the hospital with NSTEMI. He was found to be in A-flutter and was started on cardizem and heparin gtt. Pertinent labs on admission Troponin 17, Hgb A1C 7.9, K+ 4.5 and Cr 1.16. Underwent DC-CV which was successful.   LHC/ LEFT VENTRICULOGRAM -Aortic pressure 120/93, LV pressur 126/44, LVEDP 46 -L main widely patent -LAD totally occluded promially within previously placed stent -LCx widely patent -RCA co-dominant; distal RCA leading into PDA contains segmental 70% stenosis - Dilated LV and and severe dysfunction, estimate LVEF 20% or less, possible LV thrombus  ECHO (10/2013): EF 20-25%, diff HK, grade III DD, RV mildly dilated and sys fx mild/mod reduced.  Started on IV nitro and given IV lasix and then transitioned to Imdur. Lasix increased to 80 mg IV BID yesterday. Weight down 7 lbs and 24 hr I/O -4.47 liters. Cr stable. Ambulated with CR 460 ft. Denies SOB, CP or orthopnea.  Limited ECHO yesterday with definity and no conclusive evidence of LV thrombus.  Objective:   Weight Range:  Vital Signs:   Temp:  [97.1 F (36.2 C)-97.8 F (36.6 C)] 97.5 F (36.4 C) (04/29 0504) Pulse Rate:  [74-82] 77 (04/29 0504) Resp:  [18-20] 20 (04/29 0504) BP: (103-113)/(74-87) 113/83 mmHg (04/29 0504) SpO2:  [96 %-100 %] 100 % (04/29 0504) Weight:  [307 lb 14.4 oz (139.663 kg)] 307 lb 14.4 oz (139.663 kg) (04/29 0504) Last BM Date: 11/24/13  Weight change: Filed Weights   11/25/13 0543 11/26/13 0531 11/27/13 0504  Weight: 316 lb 14.4 oz (143.745 kg) 314 lb 11.2 oz (142.747 kg) 307  lb 14.4 oz (139.663 kg)    Intake/Output:   Intake/Output Summary (Last 24 hours) at 11/27/13 0737 Last data filed at 11/27/13 0505  Gross per 24 hour  Intake   1080 ml  Output   5550 ml  Net  -4470 ml     Physical Exam: General:  Well appearing. No resp difficulty, sitting in chair HEENT: normal Neck: supple. JVP difficult to assess d/t body habitus but appears elevated . Carotids 2+ bilat; no bruits. No lymphadenopathy or thryomegaly appreciated. Cor: PMI nondisplaced. Distant heart sounds. Regular rate & rhythm. No rubs, gallops or murmurs. Lungs: clear Abdomen: soft, nontender, + distended. No hepatosplenomegaly. No bruits or masses. Good bowel sounds. Extremities: no cyanosis, clubbing, rash, 2+ bilateral edema Neuro: alert & orientedx3, cranial nerves grossly intact. moves all 4 extremities w/o difficulty. Affect pleasant  Telemetry: SR 80s Labs: Basic Metabolic Panel:  Recent Labs Lab 11/23/13 0200 11/24/13 0331 11/25/13 0505 11/26/13 0544 11/27/13 0438  NA 132* 132* 130* 136* 140  K 4.4 4.5 4.0 4.2 3.7  CL 94* 92* 91* 94* 96  CO2 17* 23 27 27 31   GLUCOSE 273* 189* 152* 142* 131*  BUN 24* 43* 44* 39* 33*  CREATININE 1.29 1.73* 1.55* 1.39* 1.47*  CALCIUM 8.9 9.1 9.2 9.5 8.9    Liver Function Tests:  Recent Labs Lab 11/22/13 1000 11/23/13 0200  AST 214* 597*  ALT 138* 344*  ALKPHOS 191* 138*  BILITOT 0.9 0.6  PROT 7.6 6.9  ALBUMIN 3.2* 2.9*   No results found for this basename: LIPASE, AMYLASE,  in the last 168 hours No results found for this basename: AMMONIA,  in the last 168 hours  CBC:  Recent Labs Lab 11/23/13 0350 11/24/13 0331 11/25/13 0505 11/26/13 0544 11/27/13 0438  WBC 17.3* 21.1* 15.1* 16.2* 14.9*  HGB 13.2 13.6 14.0 14.7 13.5  HCT 39.4 40.8 42.5 43.6 40.8  MCV 80.1 80.6 80.6 80.1 81.1  PLT 271 272 300 324 294    Cardiac Enzymes:  Recent Labs Lab 11/22/13 0810 11/22/13 1645 11/22/13 2253 11/23/13 0141  TROPONINI  17.21* >20.00* >20.00* >20.00*    BNP: BNP (last 3 results) No results found for this basename: PROBNP,  in the last 8760 hours   Imaging: No results found.   Medications:    Scheduled Meds: . amiodarone  200 mg Oral BID  . aspirin  81 mg Oral Daily  . atorvastatin  40 mg Oral q1800  . carvedilol  12.5 mg Oral BID WC  . digoxin  0.125 mg Oral Daily  . furosemide  80 mg Intravenous BID  . hydrALAZINE  12.5 mg Oral 3 times per day  . insulin aspart  0-15 Units Subcutaneous TID WC  . isosorbide mononitrate  30 mg Oral Daily  . lisinopril  2.5 mg Oral Daily  . Mesalamine  800 mg Oral TID  . rivaroxaban  20 mg Oral Q lunch  . spironolactone  12.5 mg Oral Daily   Continuous Infusions:  PRN Meds:.acetaminophen, ALPRAZolam, ondansetron (ZOFRAN) IV, zolpidem   Assessment/Plan/Discussion:    1) NSTEMI - Presented to the hospital with CP and Troponin 17. Taken for [facility] which showed LAD totally occluded proximally within previously placed stent. Medical management.  Continue BB and statin. He currently is now on Xarelto.  2) Atrial flutter - s/p Cardioversion 11/22/13. Remains in NSR. - Now on Xarelto. Will continue amiodarone 200 mg BID. 3) A/C systolic HF - ECHO showed EF 20-25%, diff HK, grade III DD, RV mildly dilated and sys fx mild/mod reduced. Previous ECHO 2008 EF 45-50%. He reports he was not taking his medications for at least 3 weeks before being admitted and had progressive SOB, LE edema and orthopnea. - Still remains volume overloaded will continue lasix 80 mg IV BID and continue to watch renal function.  - SBP 100-115s will increase to lisinopril 5 mg daily and continue spironolactone 12.5 mg daily.  Follow creatinine and K.   - Will stop hydralazine today and continue to try to titrate ACE-I and spiro, renal function allowing.   - Continue Coreg and digoxin at current dose - continue to work with CR - He may need a RHC before d/c to assess hemodynamics, but will hold  off for now.  4) CAD - occluded LAD stent unknown duration. No PCI performed on 4/24. Continue medical management - He will on aspirin 81 and Xarelto for the time being.  Would not continue Plavix (avoid triple therapy).  5) ? LV thrombus - Limited ECHO  with Defininty and no conclusive evidence of LV thrombus will continue Xarelto for Afib.  6) Elevated WBC - Urinalysis pending. Afebrile.  7) ?OSA - Reports that he used CPAP in the past. Will likely need another sleep study on OP side and be re-set up with CPAP.   Length of Stay: [place] 11/27/2013, 7:37 AM  Advanced Heart Failure Team Pager [phone] (M-F; 7a - 4p)  Please contact [facility] for  night-coverage after hours (4p -7a ) and weekends on [email or web address]  Patient seen with NP, agree with the above note.  He is doing well.  Diuresed a lot yesterday.  Still some volume overload on exam.  Continue current diuretics today, maybe to po tomorrow.  Can increase lisinopril and back off on hydralazine.  Will need Lifevest at discharge.   Larey Dresser 11/27/2013 8:11 AM

## 2013-11-27 NOTE — Progress Notes (Signed)
CARDIAC REHAB PHASE I   PRE:  Rate/Rhythm: 78 SR  BP:  Supine:   Sitting:   Standing: 90/70   SaO2: 92%RA  MODE:  Ambulation: 460 ft   POST:  Rate/Rhythm: 88 SR  BP:  Supine:   Sitting: 90/64  Standing:    SaO2: 98%RA 1120-1156 Pt walked 460 ft independently with rolling walker. He does not think he will need walker for home. Gait steady. Did not feel that he could increase distance at this time. Tired by end of walk. Completed ed. Reviewed NTG use and ex ed. Pt voiced understanding. Put lifevest video for pt to view.   Graylon Good, RN BSN  11/27/2013 11:53 AM

## 2013-11-27 NOTE — Progress Notes (Signed)
  RD consulted for nutrition education regarding diabetes.   Lab Results  Component Value Date   HGBA1C 7.9* 11/22/2013    RD provided "Carbohydrate Counting for People with Diabetes" handout from the Academy of Nutrition and Dietetics. Discussed different food groups and their effects on blood sugar, emphasizing carbohydrate-containing foods. Provided list of carbohydrates and recommended serving sizes of common foods.  Discussed importance of controlled and consistent carbohydrate intake throughout the day. Provided examples of ways to balance meals/snacks and encouraged intake of high-fiber, whole grain complex carbohydrates. Teach back method used. Pt states that he started eating poorly with a lot more fast food after his wife passed. He is knowledgeable about healthy food options and states he is motivated to make changes. Pt states he will cut back on fast food, he will eat more whole grains, and he will choose lower sugar cereals.   Pt also with Heart Failure. Provided "Low Sodium Nutrition Therapy" handout from the Academy of Nutrition and Dietetics.  Reviewed patients dietary recall. Provided examples on ways to decrease sodium intake in diet. Discouraged intake of processed foods and use of salt shaker. Encouraged fresh fruits and vegetables as well as whole grain sources of carbohydrates to maximize fiber intake.   RD discussed why it is important for patient to adhere to diet recommendations, and emphasized the role of fluids, foods to avoid, and importance of weighing self daily. Teach back method used.  Expect good compliance.  Body mass index is 42.96 kg/(m^2). Pt meets criteria for Morbid Obesity based on current BMI.  Current diet order is Heart Healthy/Carb Modified, patient is consuming approximately 100% of meals at this time. Labs and medications reviewed. No further nutrition interventions warranted at this time. If additional nutrition issues arise, please re-consult  RD.  Pryor Ochoa RD, LDN Inpatient Clinical Dietitian Pager: [phone] After Hours Pager: [phone]

## 2013-11-27 NOTE — Progress Notes (Signed)
Heart Failure Navigator Consult Note  Presentation: Colton Lamb is a 63 year old gentleman with a history of paroxysmal atrial fibrillation, last episode in 2008 per the patient, hyperlipidemia and coronary artery disease s/p PCI x3 to the LAD, circumflex, PDA and PL branch (in stent restenosis on little RCA and tx with cyper stent to OM).  Presented to the hospital with NSTEMI. He was found to be in A-flutter and was started on cardizem and heparin gtt. Pertinent labs on admission Troponin 17, Hgb A1C 7.9, K+ 4.5 and Cr 1.16. Underwent DC-CV which was successful.     Past Medical History  Diagnosis Date  . Crohn's ileocolitis   . GERD (gastroesophageal reflux disease)   . Hx of adenomatous colonic polyps 11/2003  . Coronary artery disease     s/p stent placement  . MI (myocardial infarction) 2007  . Hypertension   . Obesity   . Atrial fibrillation     History   Social History  . Marital Status: Married    Spouse Name: N/A    Number of Children: 1  . Years of Education: N/A   Occupational History  . retired    Social History Main Topics  . Smoking status: Never Smoker   . Smokeless tobacco: Never Used  . Alcohol Use: No  . Drug Use: No  . Sexual Activity: None   Other Topics Concern  . None   Social History Narrative  . None    ECHO:Study Conclusions-- 11/25/13  Left ventricle: Systolic function was severely reduced. The estimated ejection fraction was in the range of 20% to 25%. Transthoracic echocardiography. M-mode, limited 2D, limited spectral Doppler, and color Doppler. Height: Height: 180.3cm. Height: 71in. Weight: Weight: 143.7kg. Weight: 316.1lb. Body mass index: BMI: 44.2kg/m^2. Body surface area: BSA: 2.73m2. Blood pressure: 118/75. Patient status: Inpatient. Location: Bedside.    BNP No results found for this basename: probnp    Education Assessment and Provision:  Detailed education and instructions provided on heart failure disease  management including the following:  Signs and symptoms of Heart Failure When to call the physician Importance of daily weights Low sodium diet  Fluid restriction Medication management Anticipated future follow-up appointments  Patient education given on each of the above topics.  Patient acknowledges understanding and acceptance of all instructions.  Mr. BDiekmanhad already been given a great deal of education and seems to have a good understanding of HF education.  He admits that prior to admission he was eating mostly fast food and had not been taking his medication because he was "too lazy to go pick them up".  I reinforced to him how important it is to take medications as prescribed without missing doses.  I also reviewed a low sodium diet and he acknowledged that he will go grocery shopping to buy appropriate foods.  He did say that his sister in law will be coming to visit to help him "get his house in order".  He does not have a scale but does have plans to go buy one at discharge.  Education Materials:  "Living Better With Heart Failure" Booklet, Daily Weight Tracker Tool and Heart Failure Educational Video.   High Risk Criteria for Readmission and/or Poor Patient Outcomes:  (Recommend Follow-up with Advanced Heart Failure Clinic)--Yes   EF <30%-  Yes-20-25%  2 or more admissions in 6 months- No  Difficult social situation- No  Demonstrates medication noncompliance- Yes   Barriers of Care:  Knowledge of medical condition and compliance issues.  Discharge Planning:   Plans to discharge to home alone.  Will benefit from [facility] to reinforce education and compliance.

## 2013-11-28 ENCOUNTER — Encounter (HOSPITAL_COMMUNITY): Payer: Self-pay | Admitting: Anesthesiology

## 2013-11-28 LAB — CBC
HCT: 42.8 % (ref 39.0–52.0)
HEMOGLOBIN: 14.2 g/dL (ref 13.0–17.0)
MCH: 27 pg (ref 26.0–34.0)
MCHC: 33.2 g/dL (ref 30.0–36.0)
MCV: 81.4 fL (ref 78.0–100.0)
Platelets: 273 10*3/uL (ref 150–400)
RBC: 5.26 MIL/uL (ref 4.22–5.81)
RDW: 15.5 % (ref 11.5–15.5)
WBC: 14.2 10*3/uL — ABNORMAL HIGH (ref 4.0–10.5)

## 2013-11-28 LAB — GLUCOSE, CAPILLARY
Glucose-Capillary: 119 mg/dL — ABNORMAL HIGH (ref 70–99)
Glucose-Capillary: 162 mg/dL — ABNORMAL HIGH (ref 70–99)
Glucose-Capillary: 118 mg/dL — ABNORMAL HIGH (ref 70–99)
Glucose-Capillary: 129 mg/dL — ABNORMAL HIGH (ref 70–99)

## 2013-11-28 LAB — COMPREHENSIVE METABOLIC PANEL
ALT: 157 U/L — ABNORMAL HIGH (ref 0–53)
AST: 48 U/L — ABNORMAL HIGH (ref 0–37)
Albumin: 3.2 g/dL — ABNORMAL LOW (ref 3.5–5.2)
Alkaline Phosphatase: 150 U/L — ABNORMAL HIGH (ref 39–117)
BUN: 29 mg/dL — ABNORMAL HIGH (ref 6–23)
CO2: 32 mEq/L (ref 19–32)
Calcium: 8.8 mg/dL (ref 8.4–10.5)
Chloride: 93 mEq/L — ABNORMAL LOW (ref 96–112)
Creatinine, Ser: 1.32 mg/dL (ref 0.50–1.35)
GFR calc Af Amer: 65 mL/min — ABNORMAL LOW (ref >90)
GFR calc non Af Amer: 56 mL/min — ABNORMAL LOW (ref >90)
Glucose, Bld: 127 mg/dL — ABNORMAL HIGH (ref 70–99)
Potassium: 3.6 mEq/L — ABNORMAL LOW (ref 3.7–5.3)
Sodium: 139 mEq/L (ref 137–147)
Total Bilirubin: 0.8 mg/dL (ref 0.3–1.2)
Total Protein: 6.7 g/dL (ref 6.0–8.3)

## 2013-11-28 LAB — PRO B NATRIURETIC PEPTIDE: PRO B NATRI PEPTIDE: 1562 pg/mL — AB (ref 0–125)

## 2013-11-28 MED ORDER — POTASSIUM CHLORIDE CRYS ER 20 MEQ PO TBCR
40.0000 meq | EXTENDED_RELEASE_TABLET | Freq: Once | ORAL | Status: AC
  Administered 2013-11-28: 40 meq via ORAL
  Filled 2013-11-28: qty 2

## 2013-11-28 MED ORDER — LISINOPRIL 5 MG PO TABS
5.0000 mg | ORAL_TABLET | Freq: Every day | ORAL | Status: DC
  Administered 2013-11-28: 5 mg via ORAL
  Filled 2013-11-28 (×2): qty 1

## 2013-11-28 MED ORDER — INSULIN ASPART 100 UNIT/ML ~~LOC~~ SOLN
0.0000 [IU] | Freq: Three times a day (TID) | SUBCUTANEOUS | Status: DC
Start: 2013-11-28 — End: 2013-11-30
  Administered 2013-11-28: 3 [IU] via SUBCUTANEOUS
  Administered 2013-11-29 – 2013-11-30 (×3): 2 [IU] via SUBCUTANEOUS

## 2013-11-28 NOTE — Discharge Summary (Signed)
Advanced Heart Failure Team  Discharge Summary   Patient ID: Colton Lamb MRN: [id number], DOB/AGE: [birth year] 63 y.o. Admit date: 11/22/2013 D/C date:     11/30/2013   PCP: Mendel Ryder, MD  Primary Cardiologist: Dr. Rockey Situ  Primary Discharge Diagnoses:  1) NSTEMI - Troponin 17 on admission and taken for LHC: LAD totally occluded proximally within previously placed stent 2) A/C systolic HF - EF 25-00%, diff HK, grade III DD, RV mildly dilated and sys fx mild/mod reduced (10/2013)  Secondary Discharge Diagnoses:    CAD S/P percutaneous coronary angioplasty - multiple PCIs   Atrial flutter   At risk for sudden cardiac death   Cardiomyopathy, ischemic   Diabetes  Hospital Course: Colton Lamb is a 63 year old male with a history of PAF and CAD. He has also been on Lasix for volume overload. He woke with chest pain radiating to his left arm, shortness of breath and palpitations. He went to [facility] where he received sublingual nitroglycerin and was found to be in atrial flutter. He was transferred to [facility] and admitted for further evaluation and treatment.  #1 non-STEMI: His troponin was significantly elevated on admission. He was anticoagulated with heparin and an aspirin, on a beta blocker and a statin. He was taken to the cath lab on 04/24, results below. Medical therapy was recommended. He had severe left ventricular dysfunction and was found adversely affected by the atrial flutter. Because of the need for anticoagulation with Xarelto, he is on aspirin but not any additional antiplatelet agents.  #2 rapid atrial flutter: He was in rapid atrial flutter on admission and because of his left ventricular dysfunction and non-STEMI, he was considered unstable. Direct current cardioversion was recommended and performed successfully. He was anticoagulated with Xarelto and will remain on this after discharge. He had amiodarone added to his medication regimen and is tolerating this,  digoxin and a beta blocker well.   #3 anticoagulation: He had been on aspirin, Coumadin and Plavix prior to admission but his Coumadin level was subtherapeutic on admission. He was changed to Xarelto and continued on aspirin. He is tolerating his medications well.  #4 at risk for sudden cardiac death: LifeVest was ordered and he will be wearing this at discharge. If he gets no improvement in his left ventricular function, and an ICD will be considered.  #5 ischemic cardiomyopathy: Colton Lamb had an echocardiogram, confirming severe left ventricular dysfunction. He is on a beta blocker, an ACE inhibitor, and a statin. He will get a followup echocardiogram in 3 months. If he gets no improvement in his left ventricular function, an ICD will be considered.  #6 acute on chronic systolic CHF: He had been on low dose of Lasix prior to admission but was significantly volume overloaded. He was placed on IV Lasix and his weight decreased almost 30 pounds during his hospital stay. He was on IV Lasix for diuresis and required 80 mg IV twice a day. His potassium was supplemented and his renal function was followed closely. His discharge labs are below. He will be on 40 mg orally twice a day at discharge. He is also on spironolactone.  #7 diabetes: As part of his evaluation, hemoglobin A1c was performed. This is significantly elevated at 7.9. He is on a diabetic diet and will followup with his primary care physician for further management.  By 05/02, his weight was down in his respiratory status was much improved. He was felt to be euvolemic. No further inpatient workup  is indicated and he is considered stable for discharge, to follow up as an outpatient.  Discharge Weight Range:  D/c weight 289 lbs Discharge Vitals: Blood pressure 95/54, pulse 72, temperature 97.3 F (36.3 C), temperature source Oral, resp. rate 20, height 5' 11"  (1.803 m), weight 289 lb 8 oz (131.316 kg), SpO2 99.00%.  Labs: Lab Results   Component Value Date   WBC 11.4* 11/30/2013   HGB 15.1 11/30/2013   HCT 45.7 11/30/2013   MCV 81.9 11/30/2013   PLT 293 11/30/2013    Recent Labs Lab 11/28/13 0624  11/30/13 0452  NA 139  < > 140  K 3.6*  < > 4.4  CL 93*  < > 90*  CO2 32  < > 37*  BUN 29*  < > 26*  CREATININE 1.32  < > 1.54*  CALCIUM 8.8  < > 9.9  PROT 6.7  --   --   BILITOT 0.8  --   --   ALKPHOS 150*  --   --   ALT 157*  --   --   AST 48*  --   --   GLUCOSE 127*  < > 105*  < > = values in this interval not displayed. Lab Results  Component Value Date   CHOL 131 11/23/2013   HDL 41 11/23/2013   LDLCALC 79 11/23/2013   TRIG 54 11/23/2013   BNP (last 3 results)  Recent Labs  11/28/13 0624  PROBNP 1562.0*   Lab Results  Component Value Date   HGBA1C 7.9* 11/22/2013    Diagnostic Studies/Procedures   Cardiac catheterization: 11/22/2013 ANGIOGRAPHIC DATA: The left main coronary artery is widely patent.  The left anterior descending artery is totally occluded proximally within the previously placed stent.  The left circumflex artery is widely patent.  The right coronary artery is right coronary is codominant. The distal RCA leading into the PDA contains segmental 70% stenosis.  LEFT VENTRICULOGRAM: Left ventricular angiogram was done in the 30 RAO projection and revealed a dilated LV and severe dysfunction. We only performed hand injection due to the severely elevated LVEDP. I would estimate that the patient's LV EF would be 20% or less.  IMPRESSIONS: 1. LAD stent occlusion, duration unknown. QS pattern across the precordium on EKG suggested this is not in acute.  2. Moderate distal right coronary obstruction and widely patent circumflex coronary artery  3. Severe left ventricular systolic function, decompensated with severe elevation in LVEDP. I suspect LV systolic dysfunction is mixed in etiology including the results of anterior infarction of unknown timing but presumed recent and/or a component of tachycardia  induced LV dysfunction.  4. Atrial flutter of unknown duration  5. Non-ST elevation myocardial infarction, difficult to sort out relative contribution of demand related to tachycardia versus obstructive disease and timing of LAD occlusion. The patient is totally pain-free at this time. He states the chest pain started last night at midnight. He is not dyspneic and his blood pressure is controlled.  RECOMMENDATION: Aggressive medical therapy of LV systolic dysfunction  2d Echocardiogram: 11/25/2013 Conclusions Left ventricle: Systolic function was severely reduced. The estimated ejection fraction was in the range of 20% to 25%. There is no conclusive evidence of LV thrombus with Definity contrast. These are extremely challenging acoustic windows however due to body habitus.   Discharge Medications     Medication List    STOP taking these medications       carvedilol 80 MG 24 hr capsule  Commonly known as:  COREG CR     clopidogrel 75 MG tablet  Commonly known as:  PLAVIX     COREG CR 80 MG 24 hr capsule  Generic drug:  carvedilol     metFORMIN 500 MG (MOD) 24 hr tablet  Commonly known as:  GLUMETZA     warfarin 3 MG tablet  Commonly known as:  COUMADIN      TAKE these medications       amiodarone 200 MG tablet  Commonly known as:  PACERONE  Take 1 tablet (200 mg total) by mouth 2 (two) times daily.     aspirin 81 MG tablet  Take 81 mg by mouth daily.     atorvastatin 80 MG tablet  Commonly known as:  LIPITOR  TAKE 1 TABLET BY MOUTH EVERY DAY     carvedilol 12.5 MG tablet  Commonly known as:  COREG  Take 1 tablet (12.5 mg total) by mouth 2 (two) times daily with a meal.     digoxin 0.25 MG tablet  Commonly known as:  LANOXIN  TAKE 1 TABLET (0.25 MG TOTAL) BY MOUTH DAILY.     furosemide 40 MG tablet  Commonly known as:  LASIX  Take 1 tablet (40 mg total) by mouth 2 (two) times daily. TAKE 1 TABLET (20 MG TOTAL) BY MOUTH DAILY.     isosorbide mononitrate 30  MG 24 hr tablet  Commonly known as:  IMDUR  TAKE 1 TABLET (30 MG TOTAL) BY MOUTH DAILY.     lisinopril 5 MG tablet  Commonly known as:  PRINIVIL,ZESTRIL  Take 1 tablet (5 mg total) by mouth 2 (two) times daily.     Mesalamine 800 MG Tbec  Commonly known as:  ASACOL HD  Take 1 tablet (800 mg total) by mouth 3 (three) times daily.     niacin 1000 MG CR tablet  Commonly known as:  NIASPAN  TAKE 1 TABLET BY MOUTH AT BEDTIME     nitroGLYCERIN 0.4 MG SL tablet  Commonly known as:  NITROSTAT  Place 0.4 mg under the tongue every 5 (five) minutes as needed.     omeprazole 20 MG capsule  Commonly known as:  PRILOSEC  Take 20 mg by mouth daily.     rivaroxaban 20 MG Tabs tablet  Commonly known as:  XARELTO  Take 1 tablet (20 mg total) by mouth daily with lunch.     spironolactone 25 MG tablet  Commonly known as:  ALDACTONE  Take 0.5 tablets (12.5 mg total) by mouth daily.        Disposition   The patient will be discharged in stable condition to home. Discharge Orders   Future Appointments Provider Department Dept Phone   12/06/2013 10:30 AM [facility] [phone]   Future Orders Complete By Expires   (HEART FAILURE PATIENTS) Call MD:  Anytime you have any of the following symptoms: 1) 3 pound weight gain in 24 hours or 5 pounds in 1 week 2) shortness of breath, with or without a dry hacking cough 3) swelling in the hands, feet or stomach 4) if you have to sleep on extra pillows at night in order to breathe.  As directed    Amb Referral to Cardiac Rehabilitation  As directed    Ambulatory referral to Nutrition and Diabetic Education  As directed    Diet - low sodium heart healthy  As directed    Increase activity slowly  As directed  Follow-up Information   Follow up with [place] On 01/05/2014. (Center will mail documents to patient for completion prior to appointment.)    Contact  information:   [place] [phone]  Appointment: 01/05/14 8pm           Follow up with [place] On 12/06/2013. (@ 10:30 am; Garage code [place]; [facility] is located in the hospital. Please bring all your medications with you ot your visit)    Specialty:  Cardiology   Contact information:   [address] [id number] [place] [address] [phone]        Duration of Discharge Encounter: Greater than 35 minutes   Signed, Lonn Georgia  11/30/2013, 2:40 PM

## 2013-11-28 NOTE — Progress Notes (Signed)
Advanced Heart Failure Rounding Note  PCP: Mendel Ryder, MD Primary Cardiologist: Dr. Rockey Situ   Subjective:    Colton Lamb is a 63 year old gentleman with a history of paroxysmal atrial fibrillation, last episode in 2008 per the patient, hyperlipidemia and coronary artery disease s/p PCI x3 to the LAD, circumflex, PDA and PL branch (in stent restenosis on little RCA and tx with cyper stent to OM).   Presented to the hospital with NSTEMI. He was found to be in A-flutter and was started on cardizem and heparin gtt. Pertinent labs on admission Troponin 17, Hgb A1C 7.9, K+ 4.5 and Cr 1.16. Underwent DC-CV which was successful.   LHC/ LEFT VENTRICULOGRAM -Aortic pressure 120/93, LV pressur 126/44, LVEDP 46 -L main widely patent -LAD totally occluded promially within previously placed stent -LCx widely patent -RCA co-dominant; distal RCA leading into PDA contains segmental 70% stenosis - Dilated LV and and severe dysfunction, estimate LVEF 20% or less, possible LV thrombus  ECHO (10/2013): EF 20-25%, diff HK, grade III DD, RV mildly dilated and sys fx mild/mod reduced.  Started on IV nitro and given IV lasix and then transitioned to Imdur. Lasix increased to 80 mg IV BID. Weight down 2 lbs and 24 hr I/O -4 liters. Cr stable. Ambulating with CR. Denies SOB, CP or orthopnea.  Limited ECHO yesterday with definity and no conclusive evidence of LV thrombus.  Objective:   Weight Range:  Vital Signs:   Temp:  [97.3 F (36.3 C)-98.1 F (36.7 C)] 97.3 F (36.3 C) (04/30 0545) Pulse Rate:  [72-86] 79 (04/30 0545) Resp:  [18-20] 18 (04/30 0545) BP: (100-115)/(64-90) 115/90 mmHg (04/30 0545) SpO2:  [94 %-99 %] 99 % (04/30 0545) Weight:  [305 lb (138.347 kg)] 305 lb (138.347 kg) (04/30 0545) Last BM Date: 11/27/13  Weight change: Filed Weights   11/26/13 0531 11/27/13 0504 11/28/13 0545  Weight: 314 lb 11.2 oz (142.747 kg) 307 lb 14.4 oz (139.663 kg) 305 lb (138.347 kg)     Intake/Output:   Intake/Output Summary (Last 24 hours) at 11/28/13 0816 Last data filed at 11/27/13 2222  Gross per 24 hour  Intake    480 ml  Output   4176 ml  Net  -3696 ml     Physical Exam: General:  Well appearing. No resp difficulty, sitting in chair HEENT: normal Neck: supple. JVP difficult to assess d/t body habitus but appears elevated (estimate 10-11 cm). Carotids 2+ bilat; no bruits. No lymphadenopathy or thryomegaly appreciated. Cor: PMI nondisplaced. Distant heart sounds. Regular rate & rhythm. No rubs, gallops or murmurs. Lungs: clear Abdomen: soft, nontender, + distended. No hepatosplenomegaly. No bruits or masses. Good bowel sounds. Extremities: no cyanosis, clubbing, rash, 2+ bilateral edema Neuro: alert & orientedx3, cranial nerves grossly intact. moves all 4 extremities w/o difficulty. Affect pleasant  Telemetry: SR 80s Labs: Basic Metabolic Panel:  Recent Labs Lab 11/24/13 0331 11/25/13 0505 11/26/13 0544 11/27/13 0438 11/28/13 0624  NA 132* 130* 136* 140 139  K 4.5 4.0 4.2 3.7 3.6*  CL 92* 91* 94* 96 93*  CO2 23 27 27 31  32  GLUCOSE 189* 152* 142* 131* 127*  BUN 43* 44* 39* 33* 29*  CREATININE 1.73* 1.55* 1.39* 1.47* 1.32  CALCIUM 9.1 9.2 9.5 8.9 8.8    Liver Function Tests:  Recent Labs Lab 11/22/13 1000 11/23/13 0200 11/28/13 0624  AST 214* 597* 48*  ALT 138* 344* 157*  ALKPHOS 191* 138* 150*  BILITOT 0.9 0.6 0.8  PROT 7.6 6.9 6.7  ALBUMIN 3.2* 2.9* 3.2*   No results found for this basename: LIPASE, AMYLASE,  in the last 168 hours No results found for this basename: AMMONIA,  in the last 168 hours  CBC:  Recent Labs Lab 11/24/13 0331 11/25/13 0505 11/26/13 0544 11/27/13 0438 11/28/13 0624  WBC 21.1* 15.1* 16.2* 14.9* 14.2*  HGB 13.6 14.0 14.7 13.5 14.2  HCT 40.8 42.5 43.6 40.8 42.8  MCV 80.6 80.6 80.1 81.1 81.4  PLT 272 300 324 294 273    Cardiac Enzymes:  Recent Labs Lab 11/22/13 0810 11/22/13 1645  11/22/13 2253 11/23/13 0141  TROPONINI 17.21* >20.00* >20.00* >20.00*    BNP: BNP (last 3 results)  Recent Labs  11/28/13 0624  PROBNP 1562.0*     Imaging: No results found.   Medications:    Scheduled Meds: . amiodarone  200 mg Oral BID  . aspirin  81 mg Oral Daily  . atorvastatin  40 mg Oral q1800  . carvedilol  12.5 mg Oral BID WC  . digoxin  0.125 mg Oral Daily  . furosemide  80 mg Intravenous BID  . hydrALAZINE  12.5 mg Oral 3 times per day  . insulin aspart  0-15 Units Subcutaneous TID WC  . isosorbide mononitrate  30 mg Oral Daily  . lisinopril  2.5 mg Oral Daily  . Mesalamine  800 mg Oral TID  . rivaroxaban  20 mg Oral Q lunch  . spironolactone  12.5 mg Oral Daily   Continuous Infusions:  PRN Meds:.acetaminophen, ALPRAZolam, ondansetron (ZOFRAN) IV, zolpidem   Assessment/Plan/Discussion:    1) NSTEMI - Presented to the hospital with CP and Troponin 17. Taken for [facility] which showed LAD totally occluded proximally within previously placed stent. Medical management.  Continue BB and statin. He currently is now on Xarelto.  2) Atrial flutter - s/p Cardioversion 11/22/13. Remains in NSR. - Now on Xarelto. Will continue amiodarone 200 mg BID. 3) A/C systolic HF - ECHO showed EF 20-25%, diff HK, grade III DD, RV mildly dilated and sys fx mild/mod reduced. Previous ECHO 2008 EF 45-50%. He reports he was not taking his medications for at least 3 weeks before being admitted and had progressive SOB, LE edema and orthopnea. - Volume status difficult to assess but still appears to have volume on board. Will continue lasix 80 mg BID until Cr starts increasing.   - SBP 100-115s, lisinopril was not increased yesterday will increase to 5 mg daily and continue to follow.   - Continue Coreg and digoxin at current dose - continue to work with CR - LifeVest fitted. - Supplement K+ 4) CAD - occluded LAD stent unknown duration. No PCI performed on 4/24. Continue medical  management - He will on aspirin 81 and Xarelto for the time being.  Would not continue Plavix (avoid triple therapy).  5) ? LV thrombus - Limited ECHO  with Defininty and no conclusive evidence of LV thrombus will continue Xarelto for Afib.  6) Elevated WBC - Urinalysis nl. Afebrile.  7) ?OSA - Reports that he used CPAP in the past. Will likely need another sleep study on OP side and be re-set up with CPAP.   Hopefully home tomorrow or Saturday.  Length of Stay: [place] NP-C 11/28/2013, 8:16 AM  Advanced Heart Failure Team Pager [phone] (M-F; 7a - 4p)  Please contact [facility] for night-coverage after hours (4p -7a ) and weekends on [email or web address]  Patient seen with NP, agree with the above note.  He diuresed  well again, still some volume overload.  Continue current regimen today, likely to po Lasix tomorrow.   Larey Dresser 11/28/2013

## 2013-11-28 NOTE — Progress Notes (Signed)
CARDIAC REHAB PHASE I   PRE:  Rate/Rhythm: 73SR  BP:  Supine:   Sitting: 85/55  Standing:    SaO2: 96%RA  MODE:  Ambulation: 700 ft   POST:  Rate/Rhythm: 83SR  BP:  Supine:   Sitting: 102/62  Standing:    SaO2: 98%RA 1328-1402 Pt walked 700 ft independently with rolling walker with steady gait. Tolerated well. No dizziness. To recliner after walk. Tolerated increase in distance well.   Graylon Good, RN BSN  11/28/2013 1:59 PM

## 2013-11-29 LAB — BASIC METABOLIC PANEL
BUN: 23 mg/dL (ref 6–23)
CO2: 32 mEq/L (ref 19–32)
CREATININE: 1.35 mg/dL (ref 0.50–1.35)
Calcium: 8.9 mg/dL (ref 8.4–10.5)
Chloride: 95 mEq/L — ABNORMAL LOW (ref 96–112)
GFR calc non Af Amer: 55 mL/min — ABNORMAL LOW (ref >90)
GFR, EST AFRICAN AMERICAN: 63 mL/min — AB (ref >90)
Glucose, Bld: 129 mg/dL — ABNORMAL HIGH (ref 70–99)
Potassium: 4.1 mEq/L (ref 3.7–5.3)
Sodium: 141 mEq/L (ref 137–147)

## 2013-11-29 LAB — CBC
HCT: 42.4 % (ref 39.0–52.0)
Hemoglobin: 14.1 g/dL (ref 13.0–17.0)
MCH: 27.1 pg (ref 26.0–34.0)
MCHC: 33.3 g/dL (ref 30.0–36.0)
MCV: 81.4 fL (ref 78.0–100.0)
PLATELETS: 287 10*3/uL (ref 150–400)
RBC: 5.21 MIL/uL (ref 4.22–5.81)
RDW: 15.4 % (ref 11.5–15.5)
WBC: 13.2 10*3/uL — AB (ref 4.0–10.5)

## 2013-11-29 LAB — GLUCOSE, CAPILLARY
Glucose-Capillary: 118 mg/dL — ABNORMAL HIGH (ref 70–99)
Glucose-Capillary: 141 mg/dL — ABNORMAL HIGH (ref 70–99)
Glucose-Capillary: 112 mg/dL — ABNORMAL HIGH (ref 70–99)
Glucose-Capillary: 135 mg/dL — ABNORMAL HIGH (ref 70–99)

## 2013-11-29 MED ORDER — METOLAZONE 2.5 MG PO TABS
2.5000 mg | ORAL_TABLET | Freq: Two times a day (BID) | ORAL | Status: DC
  Administered 2013-11-29 – 2013-11-30 (×3): 2.5 mg via ORAL
  Filled 2013-11-29 (×4): qty 1

## 2013-11-29 MED ORDER — LISINOPRIL 5 MG PO TABS
5.0000 mg | ORAL_TABLET | Freq: Two times a day (BID) | ORAL | Status: DC
  Administered 2013-11-29 – 2013-11-30 (×3): 5 mg via ORAL
  Filled 2013-11-29 (×4): qty 1

## 2013-11-29 NOTE — Progress Notes (Signed)
CARDIAC REHAB PHASE I   PRE:  Rate/Rhythm: 71 SR  BP:  Supine:   Sitting: 90/60  Standing:    SaO2: 96 RA  MODE:  Ambulation: 760 ft   POST:  Rate/Rhythm: 86  BP:  Supine:   Sitting: 100/68  Standing:    SaO2: 98 RA 1455-1515 Assisted X 1 and used walker to ambulate. Gait steady, slow pace. Pt states that his breathing is better. He was able to walk 760 feet without c/o of SOB. Pt to recliner after walk with call light in reach.  Rodney Langton RN 11/29/2013 3:17 PM

## 2013-11-29 NOTE — Progress Notes (Signed)
Advanced Heart Failure Rounding Note  PCP: Mendel Ryder, MD Primary Cardiologist: Dr. Rockey Situ   Subjective:    Colton Lamb is a 63 year old gentleman with a history of paroxysmal atrial fibrillation, last episode in 2008 per the patient, hyperlipidemia and coronary artery disease s/p PCI x3 to the LAD, circumflex, PDA and PL branch (in stent restenosis on little RCA and tx with cyper stent to OM).   Presented to the hospital with NSTEMI. He was found to be in A-flutter and was started on cardizem and heparin gtt. Pertinent labs on admission Troponin 17, Hgb A1C 7.9, K+ 4.5 and Cr 1.16. Underwent DC-CV which was successful.   LHC/ LEFT VENTRICULOGRAM -Aortic pressure 120/93, LV pressur 126/44, LVEDP 46 -L main widely patent -LAD totally occluded promially within previously placed stent -LCx widely patent -RCA co-dominant; distal RCA leading into PDA contains segmental 70% stenosis - Dilated LV and and severe dysfunction, estimate LVEF 20% or less, possible LV thrombus  ECHO (10/2013): EF 20-25%, diff HK, grade III DD, RV mildly dilated and sys fx mild/mod reduced.  Started on IV nitro and given IV lasix and then transitioned to Imdur. Lasix increased to 80 mg IV BID. Weight down 3 lbs and 24 hr I/O -4 liters. Cr stable. Fitted for LifeVest. Ambulating with CR. Denies SOB, CP or orthopnea.  Limited ECHO yesterday with definity and no conclusive evidence of LV thrombus.  Objective:   Weight Range:  Vital Signs:   Temp:  [97.1 F (36.2 C)-97.8 F (36.6 C)] 97.8 F (36.6 C) (04/30 2110) Pulse Rate:  [72-82] 81 (05/01 0819) Resp:  [18] 18 (05/01 0611) BP: (90-120)/(59-85) 114/73 mmHg (05/01 0819) SpO2:  [98 %-100 %] 100 % (05/01 9675) Weight:  [302 lb (136.986 kg)] 302 lb (136.986 kg) (05/01 0611) Last BM Date: 11/28/13  Weight change: Filed Weights   11/27/13 0504 11/28/13 0545 11/29/13 0611  Weight: 307 lb 14.4 oz (139.663 kg) 305 lb (138.347 kg) 302 lb (136.986 kg)     Intake/Output:   Intake/Output Summary (Last 24 hours) at 11/29/13 0946 Last data filed at 11/29/13 0847  Gross per 24 hour  Intake    940 ml  Output   3352 ml  Net  -2412 ml     Physical Exam: General:  Well appearing. No resp difficulty, sitting in chair HEENT: normal Neck: supple. JVP difficult to assess d/t body habitus but appears elevated (estimate 9-10 cm). Carotids 2+ bilat; no bruits. No lymphadenopathy or thryomegaly appreciated. Cor: PMI nondisplaced. Distant heart sounds. Regular rate & rhythm. No rubs, gallops or murmurs. Lungs: clear Abdomen: soft, nontender, + distended. No hepatosplenomegaly. No bruits or masses. Good bowel sounds. Extremities: no cyanosis, clubbing, rash, 2+ bilateral edema Neuro: alert & orientedx3, cranial nerves grossly intact. moves all 4 extremities w/o difficulty. Affect pleasant  Telemetry: SR 80s Labs: Basic Metabolic Panel:  Recent Labs Lab 11/25/13 0505 11/26/13 0544 11/27/13 0438 11/28/13 0624 11/29/13 0635  NA 130* 136* 140 139 141  K 4.0 4.2 3.7 3.6* 4.1  CL 91* 94* 96 93* 95*  CO2 27 27 31  32 32  GLUCOSE 152* 142* 131* 127* 129*  BUN 44* 39* 33* 29* 23  CREATININE 1.55* 1.39* 1.47* 1.32 1.35  CALCIUM 9.2 9.5 8.9 8.8 8.9    Liver Function Tests:  Recent Labs Lab 11/22/13 1000 11/23/13 0200 11/28/13 0624  AST 214* 597* 48*  ALT 138* 344* 157*  ALKPHOS 191* 138* 150*  BILITOT 0.9 0.6 0.8  PROT 7.6 6.9  6.7  ALBUMIN 3.2* 2.9* 3.2*   No results found for this basename: LIPASE, AMYLASE,  in the last 168 hours No results found for this basename: AMMONIA,  in the last 168 hours  CBC:  Recent Labs Lab 11/25/13 0505 11/26/13 0544 11/27/13 0438 11/28/13 0624 11/29/13 0635  WBC 15.1* 16.2* 14.9* 14.2* 13.2*  HGB 14.0 14.7 13.5 14.2 14.1  HCT 42.5 43.6 40.8 42.8 42.4  MCV 80.6 80.1 81.1 81.4 81.4  PLT 300 324 294 273 287    Cardiac Enzymes:  Recent Labs Lab 11/22/13 1645 11/22/13 2253 11/23/13 0141   TROPONINI >20.00* >20.00* >20.00*    BNP: BNP (last 3 results)  Recent Labs  11/28/13 0624  PROBNP 1562.0*     Imaging: No results found.   Medications:    Scheduled Meds: . amiodarone  200 mg Oral BID  . aspirin  81 mg Oral Daily  . atorvastatin  40 mg Oral q1800  . carvedilol  12.5 mg Oral BID WC  . digoxin  0.125 mg Oral Daily  . furosemide  80 mg Intravenous BID  . insulin aspart  0-15 Units Subcutaneous TID WC  . isosorbide mononitrate  30 mg Oral Daily  . lisinopril  5 mg Oral Daily  . Mesalamine  800 mg Oral TID  . rivaroxaban  20 mg Oral Q lunch  . spironolactone  12.5 mg Oral Daily   Continuous Infusions:  PRN Meds:.acetaminophen, ALPRAZolam, ondansetron (ZOFRAN) IV, zolpidem   Assessment/Plan/Discussion:    1) NSTEMI - Presented to the hospital with CP and Troponin 17. Taken for [facility] which showed LAD totally occluded proximally within previously placed stent. Medical management.  Continue BB and statin. He currently is now on Xarelto.  2) Atrial flutter - s/p Cardioversion 11/22/13. Remains in NSR. - Now on Xarelto. Will continue amiodarone 200 mg BID. 3) A/C systolic HF - ECHO showed EF 20-25%, diff HK, grade III DD, RV mildly dilated and sys fx mild/mod reduced. Previous ECHO 2008 EF 45-50%. He reports he was not taking his medications for at least 3 weeks before being admitted and had progressive SOB, LE edema and orthopnea. - Volume status difficult to assess but still appears to have volume on board and has 2+ bilateral edema. Creatinine is stable. Will continue lasix 80 mg BID and add 2.5 metolazone x 1. Would like him as dry as possible before going home. Continue to follow Cr.  - SBP 100-115s, will increase lisinopril to 5 mg BID. - Continue Coreg, spiro and digoxin at current dose - continue to work with CR - LifeVest fitted. 4) CAD - occluded LAD stent unknown duration. No PCI performed on 4/24. Continue medical management - He will on aspirin  81 and Xarelto for the time being.  Would not continue Plavix (avoid triple therapy).  5) ? LV thrombus - Limited ECHO  with Defininty and no conclusive evidence of LV thrombus will continue Xarelto for Afib.  6) Elevated WBC - Urinalysis nl. Afebrile.  7) ?OSA - Reports that he used CPAP in the past. Will likely need another sleep study on OP side and be re-set up with CPAP.   Hopefully home tomorrow. Will place follow up appointment in the HF clinic for end of next week. He was on lasix 20 mg daily at home which he had not been taking for about 3 weeks, will try 40 mg BID and can reassess at clinic appointment.   Length of Stay: [place] NP-C 11/29/2013,  9:46 AM  Advanced Heart Failure Team Pager [phone] (M-F; 7a - 4p)  Please contact [facility] for night-coverage after hours (4p -7a ) and weekends on [email or web address]  Patient seen with NP, agree with the above note.  He diuresed well again, still some volume overload.  Weight continues to fall.  Will give one dose of metolazone 2.5 today.  I would plan on him going home tomorrow.  He will need Lifevest, has already been fitted.  He will have CHF clinic appointment next week.  Agree with Lasix 40 mg po bid at home. Would then continue other cardiac meds as they are currently as long as renal function stable tomorrow.   Larey Dresser 11/29/2013

## 2013-11-29 NOTE — Progress Notes (Signed)
UR completed Lakecia Deschamps K. Kaeson Kleinert, RN, BSN, [place], CCM  11/29/2013 11:39 AM

## 2013-11-30 LAB — BASIC METABOLIC PANEL
BUN: 26 mg/dL — AB (ref 6–23)
CO2: 37 meq/L — AB (ref 19–32)
Calcium: 9.9 mg/dL (ref 8.4–10.5)
Chloride: 90 mEq/L — ABNORMAL LOW (ref 96–112)
Creatinine, Ser: 1.54 mg/dL — ABNORMAL HIGH (ref 0.50–1.35)
GFR calc non Af Amer: 47 mL/min — ABNORMAL LOW (ref >90)
GFR, EST AFRICAN AMERICAN: 54 mL/min — AB (ref >90)
GLUCOSE: 105 mg/dL — AB (ref 70–99)
POTASSIUM: 4.4 meq/L (ref 3.7–5.3)
Sodium: 140 mEq/L (ref 137–147)

## 2013-11-30 LAB — CBC
HCT: 45.7 % (ref 39.0–52.0)
HEMOGLOBIN: 15.1 g/dL (ref 13.0–17.0)
MCH: 27.1 pg (ref 26.0–34.0)
MCHC: 33 g/dL (ref 30.0–36.0)
MCV: 81.9 fL (ref 78.0–100.0)
Platelets: 293 10*3/uL (ref 150–400)
RBC: 5.58 MIL/uL (ref 4.22–5.81)
RDW: 15.5 % (ref 11.5–15.5)
WBC: 11.4 10*3/uL — ABNORMAL HIGH (ref 4.0–10.5)

## 2013-11-30 LAB — GLUCOSE, CAPILLARY
Glucose-Capillary: 121 mg/dL — ABNORMAL HIGH (ref 70–99)
Glucose-Capillary: 133 mg/dL — ABNORMAL HIGH (ref 70–99)

## 2013-11-30 MED ORDER — SPIRONOLACTONE 25 MG PO TABS: 12.5000 mg | ORAL_TABLET | Freq: Every day | ORAL | Status: DC

## 2013-11-30 MED ORDER — RIVAROXABAN 20 MG PO TABS: 20.0000 mg | ORAL_TABLET | Freq: Every day | ORAL | Status: DC

## 2013-11-30 MED ORDER — DIGOXIN 250 MCG PO TABS: ORAL_TABLET | ORAL | Status: DC

## 2013-11-30 MED ORDER — CARVEDILOL 12.5 MG PO TABS: 12.5000 mg | ORAL_TABLET | Freq: Two times a day (BID) | ORAL | Status: DC

## 2013-11-30 MED ORDER — FUROSEMIDE 40 MG PO TABS: 40.0000 mg | ORAL_TABLET | Freq: Two times a day (BID) | ORAL | Status: DC

## 2013-11-30 MED ORDER — AMIODARONE HCL 200 MG PO TABS: 200.0000 mg | ORAL_TABLET | Freq: Two times a day (BID) | ORAL | Status: DC

## 2013-11-30 MED ORDER — LISINOPRIL 5 MG PO TABS: 5.0000 mg | ORAL_TABLET | Freq: Two times a day (BID) | ORAL | Status: DC

## 2013-11-30 NOTE — Progress Notes (Signed)
CARDIAC REHAB PHASE I   PRE:  Rate/Rhythm: 72 SR  BP:  Supine:   Sitting: 96/62  Standing:    SaO2: 99 RA  MODE:  Ambulation: 760 ft   POST:  Rate/Rhythm: 81  BP:  Supine:   Sitting: 94/70  Standing:    SaO2: 99 RA 0950-1015 Assisted X 1 and used walker to ambulate. Gait steady with walker. Pt able to walk 760 feet without c/o of SOB. Pt states that he is feeling much better and that his SOB is much improved. Pt to recliner after walk with call light in reach.  We reviewed CHF education, pt voices understanding. Rodney Langton RN 11/30/2013 10:11 AM

## 2013-11-30 NOTE — Progress Notes (Signed)
Pt A&O x4. Pt denies pain and SOB. Will continue to monitor

## 2013-11-30 NOTE — Progress Notes (Signed)
Patient discharged to home.Discharge instructions and medications reviewed with Patient. Patient states understanding. All patient questions answered.

## 2013-11-30 NOTE — Progress Notes (Signed)
Patient Name: Colton Lamb Date of Encounter: 11/30/2013  Active Problems:   CAD S/P percutaneous coronary angioplasty - multiple PCIs   Atrial flutter   NSTEMI (non-ST elevated myocardial infarction)   At risk for sudden cardiac death   Acute combined systolic and diastolic CHF, NYHA class 3 -- 2/2 MI   Cardiomyopathy, ischemic   Length of Stay: 8  SUBJECTIVE  Walked the hallways without angina or dyspnea. No arrhythmia on telemetry.  CURRENT MEDS . amiodarone  200 mg Oral BID  . aspirin  81 mg Oral Daily  . atorvastatin  40 mg Oral q1800  . carvedilol  12.5 mg Oral BID WC  . digoxin  0.125 mg Oral Daily  . furosemide  80 mg Intravenous BID  . insulin aspart  0-15 Units Subcutaneous TID WC  . isosorbide mononitrate  30 mg Oral Daily  . lisinopril  5 mg Oral BID  . Mesalamine  800 mg Oral TID  . metolazone  2.5 mg Oral BID  . rivaroxaban  20 mg Oral Q lunch  . spironolactone  12.5 mg Oral Daily    OBJECTIVE   Intake/Output Summary (Last 24 hours) at 11/30/13 1234 Last data filed at 11/30/13 1100  Gross per 24 hour  Intake   1200 ml  Output   7525 ml  Net  -6325 ml   Filed Weights   11/28/13 0545 11/29/13 0611 11/30/13 0701  Weight: 305 lb (138.347 kg) 302 lb (136.986 kg) 289 lb 8 oz (131.316 kg)    PHYSICAL EXAM Filed Vitals:   11/29/13 1500 11/29/13 2050 11/30/13 0701 11/30/13 0934  BP: 116/77 116/68 119/64 101/47  Pulse: 86 72 75 75  Temp: 97.8 F (36.6 C) 98.1 F (36.7 C) 97.6 F (36.4 C) 97.6 F (36.4 C)  TempSrc: Oral Oral Oral Oral  Resp: 18 18 18 20   Height:      Weight:   289 lb 8 oz (131.316 kg)   SpO2: 100% 98% 97% 98%   General: Alert, oriented x3, no distress Head: no evidence of trauma, PERRL, EOMI, no exophtalmos or lid lag, no myxedema, no xanthelasma; normal ears, nose and oropharynx Neck: cannot see his jugular venous pulsations, but no hepatojugular reflux; brisk carotid pulses without delay and no carotid bruits Chest: clear  to auscultation, no signs of consolidation by percussion or palpation, normal fremitus, symmetrical and full respiratory excursions Cardiovascular: normal position and quality of the apical impulse, regular rhythm, normal first and second heart sounds, no rubs or gallops, no murmur Abdomen: no tenderness or distention, no masses by palpation, no abnormal pulsatility or arterial bruits, normal bowel sounds, no hepatosplenomegaly Extremities: no clubbing, cyanosis or edema; 2+ radial, ulnar and brachial pulses bilaterally; 2+ right femoral, posterior tibial and dorsalis pedis pulses; 2+ left femoral, posterior tibial and dorsalis pedis pulses; no subclavian or femoral bruits Neurological: grossly nonfocal  LABS  CBC  Recent Labs  11/29/13 0635 11/30/13 0452  WBC 13.2* 11.4*  HGB 14.1 15.1  HCT 42.4 45.7  MCV 81.4 81.9  PLT 287 902   Basic Metabolic Panel  Recent Labs  11/29/13 0635 11/30/13 0452  NA 141 140  K 4.1 4.4  CL 95* 90*  CO2 32 37*  GLUCOSE 129* 105*  BUN 23 26*  CREATININE 1.35 1.54*  CALCIUM 8.9 9.9   Liver Function Tests  Recent Labs  11/28/13 0624  AST 48*  ALT 157*  ALKPHOS 150*  BILITOT 0.8  PROT 6.7  ALBUMIN 3.2*  Radiology Studies Imaging results have been reviewed and No results found.  TELE NSR around 70  ECG Consistent with extensive anterior MI/scar  ASSESSMENT AND PLAN As much as can tell with his body habitus, may be euvolemic. Edema resolved Note increase in serum creatinine. SBP 101, ACEi dose just increased. On carvedilol and spironolactone. Furosemide 40 mg BID planned until f/u. No metolazone.  DC home today with LifeVest. Reevaluate EF in 3 months before making a decision re: ICD Discussed weight monitoring, sodium restriction, signs/symptoms of HF exacerbation, medication compliance. Needs early follow up in clinic for transition of care - to be seen in CHF clinic.  Sanda Klein, MD, [facility] [phone]  office [phone] pager 11/30/2013 12:34 PM

## 2013-12-04 NOTE — Discharge Summary (Signed)
Patient seen and examined with Rosaria Ferries, PA-C. We discussed all aspects of the encounter. I agree with the assessment and plan as stated above. He is ready for d/c. Follow-up as above.  Shaune Pascal Dawsyn Ramsaran,MD 2:42 PM

## 2013-12-06 ENCOUNTER — Encounter (HOSPITAL_COMMUNITY): Payer: Self-pay

## 2013-12-06 ENCOUNTER — Telehealth (HOSPITAL_COMMUNITY): Payer: Self-pay | Admitting: Anesthesiology

## 2013-12-06 ENCOUNTER — Ambulatory Visit (HOSPITAL_COMMUNITY)
Admit: 2013-12-06 | Discharge: 2013-12-06 | Disposition: A | Discharge status: Home / Self Care | Payer: BC Managed Care – PPO | Attending: Internal Medicine | Admitting: Internal Medicine

## 2013-12-06 VITALS — BP 100/58 | HR 65 | Resp 18 | Wt 291.4 lb

## 2013-12-06 DIAGNOSIS — I214 Non-ST elevation (NSTEMI) myocardial infarction: Secondary | ICD-10-CM | POA: Insufficient documentation

## 2013-12-06 DIAGNOSIS — I2589 Other forms of chronic ischemic heart disease: Secondary | ICD-10-CM | POA: Insufficient documentation

## 2013-12-06 DIAGNOSIS — I4892 Unspecified atrial flutter: Secondary | ICD-10-CM

## 2013-12-06 DIAGNOSIS — I10 Essential (primary) hypertension: Secondary | ICD-10-CM | POA: Insufficient documentation

## 2013-12-06 DIAGNOSIS — Z7982 Long term (current) use of aspirin: Secondary | ICD-10-CM | POA: Insufficient documentation

## 2013-12-06 DIAGNOSIS — Z9861 Coronary angioplasty status: Secondary | ICD-10-CM

## 2013-12-06 DIAGNOSIS — K5080 Crohn's disease of both small and large intestine without complications: Secondary | ICD-10-CM | POA: Insufficient documentation

## 2013-12-06 DIAGNOSIS — I5022 Chronic systolic (congestive) heart failure: Secondary | ICD-10-CM

## 2013-12-06 DIAGNOSIS — I509 Heart failure, unspecified: Secondary | ICD-10-CM | POA: Insufficient documentation

## 2013-12-06 DIAGNOSIS — K219 Gastro-esophageal reflux disease without esophagitis: Secondary | ICD-10-CM | POA: Insufficient documentation

## 2013-12-06 DIAGNOSIS — E785 Hyperlipidemia, unspecified: Secondary | ICD-10-CM | POA: Insufficient documentation

## 2013-12-06 DIAGNOSIS — G4733 Obstructive sleep apnea (adult) (pediatric): Secondary | ICD-10-CM

## 2013-12-06 DIAGNOSIS — E669 Obesity, unspecified: Secondary | ICD-10-CM | POA: Insufficient documentation

## 2013-12-06 DIAGNOSIS — I5023 Acute on chronic systolic (congestive) heart failure: Secondary | ICD-10-CM | POA: Insufficient documentation

## 2013-12-06 DIAGNOSIS — I4891 Unspecified atrial fibrillation: Secondary | ICD-10-CM

## 2013-12-06 DIAGNOSIS — I2510 Atherosclerotic heart disease of native coronary artery without angina pectoris: Secondary | ICD-10-CM

## 2013-12-06 DIAGNOSIS — Z7901 Long term (current) use of anticoagulants: Secondary | ICD-10-CM

## 2013-12-06 DIAGNOSIS — Z79899 Other long term (current) drug therapy: Secondary | ICD-10-CM | POA: Insufficient documentation

## 2013-12-06 LAB — BASIC METABOLIC PANEL
BUN: 79 mg/dL — AB (ref 6–23)
CO2: 30 mEq/L (ref 19–32)
Calcium: 9.1 mg/dL (ref 8.4–10.5)
Chloride: 88 mEq/L — ABNORMAL LOW (ref 96–112)
Creatinine, Ser: 3.51 mg/dL — ABNORMAL HIGH (ref 0.50–1.35)
GFR calc non Af Amer: 17 mL/min — ABNORMAL LOW (ref >90)
GFR, EST AFRICAN AMERICAN: 20 mL/min — AB (ref >90)
Glucose, Bld: 192 mg/dL — ABNORMAL HIGH (ref 70–99)
POTASSIUM: 4 meq/L (ref 3.7–5.3)
Sodium: 134 mEq/L — ABNORMAL LOW (ref 137–147)

## 2013-12-06 NOTE — Telephone Encounter (Signed)
Reviewed patients labs. Creatinine elevated 3.51 up from 1.54. Patient has been taking lasix 40 mg BID. Will have patient stop lasix until repeat lab work on Monday and also stop Spironolactone. Repeat BMET Monday and if Cr<2.0 would restart lasix 40 mg daily.  Rande Brunt NP-C 12:23 PM

## 2013-12-06 NOTE — Telephone Encounter (Signed)
Patient aware of medication changes and we will call back with appointment time for lab work on Monday.

## 2013-12-06 NOTE — Patient Instructions (Signed)
Doing great.  Continue to try and be as active as possible. Stop if you are short of breath.  Call any issues.  Bring all you medications to your next visit.  F/U 2 weeks  Do the following things EVERYDAY: 1) Weigh yourself in the morning before breakfast. Write it down and keep it in a log. 2) Take your medicines as prescribed 3) Eat low salt foods-Limit salt (sodium) to 2000 mg per day.  4) Stay as active as you can everyday 5) Limit all fluids for the day to less than 2 liters 6)

## 2013-12-06 NOTE — Addendum Note (Signed)
Addended by: Scarlette Calico on: 12/06/2013 03:41 PM   Modules accepted: Orders

## 2013-12-06 NOTE — Telephone Encounter (Signed)
bmet and dig sch for Mon at [facility] [place] in [place], pt aware, he is also sch for sleep study on 6/7

## 2013-12-06 NOTE — Addendum Note (Signed)
Addended by: Scarlette Calico on: 12/06/2013 03:29 PM   Modules accepted: Orders

## 2013-12-06 NOTE — Progress Notes (Addendum)
Patient ID: Colton Lamb, male   DOB: Dec 05, 1950, 63 y.o.   MRN: [id number]  PCP: Dr. Harle Stanford [facility]) Primary Cardiologist: Dr. Rockey Situ  HPI:  Colton Lamb is a 63 year old gentleman with a history of paroxysmal atrial fibrillation, last episode in 2008 per the patient, hyperlipidemia and coronary artery disease s/p PCI x3 to the LAD, circumflex, PDA and PL branch (in stent restenosis on little RCA and tx with cyper stent to OM).   Presented to the hospital with NSTEMI 4/24- 5/3/15He was found to be in A-flutter and was started on cardizem and heparin gtt. Pertinent labs on admission Troponin 17, Hgb A1C 7.9, K+ 4.5 and Cr 1.16. Underwent DC-CV which was successful. Diuresed with IV lasix and discharge weight 289 lbs. He has some NSVT in the hospital and LIfeVest was placed.   LHC/ LEFT VENTRICULOGRAM  -Aortic pressure 120/93, LV pressur 126/44, LVEDP 46  -L main widely patent  -LAD totally occluded promially within previously placed stent  -LCx widely patent  -RCA co-dominant; distal RCA leading into PDA contains segmental 70% stenosis  - Dilated LV and and severe dysfunction, estimate LVEF 20% or less, possible LV thrombus   ECHO (10/2013): EF 20-25%, diff HK, grade III DD, RV mildly dilated and sys fx mild/mod reduced.  [facility] F/U: Doing well since being discharged. Denies SOB, PND, CP or palpitations. Sleeps on 2 pillows for comfort. Weight at home 291-293lbs. Following a low salt diet and drinking less than 2L a day. No s/s of bleeding. Patient declined home health. Denies dizziness. Walking at home for 5 min a day and trying to increase slowly. Able to walk around the whole grocery store with no issues. No alarms with LifeVest.   ROS: All systems negative except as listed in HPI, PMH and Problem List.  Past Medical History  Diagnosis Date  . Crohn's ileocolitis   . GERD (gastroesophageal reflux disease)   . Hx of adenomatous colonic polyps 11/2003  . Coronary  artery disease     s/p stent placement  . MI (myocardial infarction) 2007  . Hypertension   . Obesity   . Atrial fibrillation   . Chronic systolic heart failure     a) EF 20-25%, grade III DD, RV mildly dilated and sys fx mild/mod reduced (10/2013)  . Ischemic cardiomyopathy     a) LHC (10/2013): LAD stent occlusion, duration unknown. QS pattern across precordium on EKG suggest not acute, moderate distal RCA obstruction and widely patent circumflex coronary artery  . Atrial flutter     a) s/p Cardioversion 11/22/13, on amiodarone and Xarelto    Current Outpatient Prescriptions  Medication Sig Dispense Refill  . amiodarone (PACERONE) 200 MG tablet Take 1 tablet (200 mg total) by mouth 2 (two) times daily.  60 tablet  11  . aspirin 81 MG tablet Take 81 mg by mouth daily.        Marland Kitchen atorvastatin (LIPITOR) 80 MG tablet TAKE 1 TABLET BY MOUTH EVERY DAY  30 tablet  3  . carvedilol (COREG) 12.5 MG tablet Take 1 tablet (12.5 mg total) by mouth 2 (two) times daily with a meal.  60 tablet  11  . digoxin (LANOXIN) 0.25 MG tablet TAKE 1 TABLET (0.25 MG TOTAL) BY MOUTH DAILY.  30 tablet  3  . furosemide (LASIX) 40 MG tablet Take 40 mg by mouth 2 (two) times daily.      . isosorbide mononitrate (IMDUR) 30 MG 24 hr tablet TAKE 1 TABLET (30 MG  TOTAL) BY MOUTH DAILY.  30 tablet  6  . lisinopril (PRINIVIL,ZESTRIL) 5 MG tablet Take 1 tablet (5 mg total) by mouth 2 (two) times daily.  60 tablet  11  . Mesalamine (ASACOL HD) 800 MG TBEC Take 1 tablet (800 mg total) by mouth 3 (three) times daily.  90 tablet  11  . niacin (NIASPAN) 1000 MG CR tablet TAKE 1 TABLET BY MOUTH AT BEDTIME  30 tablet  5  . nitroGLYCERIN (NITROSTAT) 0.4 MG SL tablet Place 0.4 mg under the tongue every 5 (five) minutes as needed.        Marland Kitchen omeprazole (PRILOSEC) 20 MG capsule Take 20 mg by mouth daily.        . rivaroxaban (XARELTO) 20 MG TABS tablet Take 1 tablet (20 mg total) by mouth daily with lunch.  30 tablet  11  . spironolactone  (ALDACTONE) 25 MG tablet Take 0.5 tablets (12.5 mg total) by mouth daily.  30 tablet  11   No current facility-administered medications for this encounter.    Filed Vitals:   12/06/13 1032  BP: 100/58  Pulse: 65  Resp: 18  Weight: 291 lb 6 oz (132.167 kg)  SpO2: 98%   PHYSICAL EXAM: General: Well appearing. No resp difficulty  HEENT: normal  Neck: supple. JVP difficult to assess d/t body habitus but does not appear elevated; Carotids 2+ bilat; no bruits. No lymphadenopathy or thryomegaly appreciated.  Cor: PMI nondisplaced. Distant heart sounds. Regular rate & rhythm. No rubs, gallops or murmurs. LifeVest intact Lungs: clear  Abdomen: soft, nontender, non distended. No hepatosplenomegaly. No bruits or masses. Good bowel sounds.  Extremities: no cyanosis, clubbing, rash, 2+ bilateral edema  Neuro: alert & orientedx3, cranial nerves grossly intact. moves all 4 extremities w/o difficulty. Affect pleasant   ASSESSMENT & PLAN:  1) Chronic systolic HF: ICM, EF 50-56%, grade III DD, RV mildly dilated and sys fx mild/mod reduced (10/2013) - Reviewed discharge summary and patient recently admitted for NSTEMI and atrial flutter. He was found to be in A/C HF. LHC was performed and medical management was recommended for LAD stent occlusion and moderate distal RCA occlusion. He was diuresed with IV lasix and discharge weight 289 lbs. LifeVest was placed. - NYHA II symptoms and volume status stable. Weight at home 291-293 lbs. Will continue lasix 40 mg BID. - Will not titrate any medications currently. Continue coreg 12.5 mg BID, digoxin 0.25 mg daily, lisinopril 5 mg BID and spiro 12.5 mg daily. - Check BMET today  - Will need repeat ECHO 2nd week of June to assess EF. If remains less than 35% will refer to EP for ICD for primary prevention. QRS narrow, no CRT-D. Continue LifeVest - Provided the patient with pill box and bag to bring all medications to visits.  - Reinforced the need and importance  of daily weights, a low sodium diet, and fluid restriction (less than 2 L a day). Instructed to call the HF clinic if weight increases more than 3 lbs overnight or 5 lbs in a week.  2) HTN - stable. Continue current regimen 3) CAD - occluded LAD stent unknown duration. No PCI performed on 11/22/13. Continue medical management  - Continue aspirin 81 and Xarelto for the time being.  4) ?OSA - Reports that he used CPAP in the past. Will place order for sleep study already has appt.  5) Obesity - Discussed with patient watching portion control and trying to be as active as possible.  6) Atrial  flutter - s/p s/p Cardioversion 11/22/13. Appears to be in NSR. Will continue Xarelto and amiodarone 200 mg BID.   F/U 2 weeks. Rande Brunt NP-C 12:51 PM  Addendum: Reviewed labs and Cr elevated to 3.51 from 1.54 and BUN 79 up from 26. Will stop lasix and spironolactone until repeat BMET on Monday. If Creatinine < 2.0 will restart lasix 40 mg daily. Patient aware.

## 2013-12-09 ENCOUNTER — Ambulatory Visit (INDEPENDENT_AMBULATORY_CARE_PROVIDER_SITE_OTHER): Payer: BC Managed Care – PPO

## 2013-12-09 DIAGNOSIS — I5022 Chronic systolic (congestive) heart failure: Secondary | ICD-10-CM

## 2013-12-09 DIAGNOSIS — Z79899 Other long term (current) drug therapy: Secondary | ICD-10-CM

## 2013-12-10 LAB — BASIC METABOLIC PANEL
BUN/Creatinine Ratio: 30 — ABNORMAL HIGH (ref 10–22)
BUN: 80 mg/dL (ref 8–27)
CALCIUM: 9.3 mg/dL (ref 8.6–10.2)
CO2: 24 mmol/L (ref 18–29)
CREATININE: 2.68 mg/dL — AB (ref 0.76–1.27)
Chloride: 92 mmol/L — ABNORMAL LOW (ref 97–108)
GFR calc Af Amer: 28 mL/min/{1.73_m2} — ABNORMAL LOW (ref >59)
GFR, EST NON AFRICAN AMERICAN: 24 mL/min/{1.73_m2} — AB (ref >59)
GLUCOSE: 213 mg/dL — AB (ref 65–99)
Potassium: 5.1 mmol/L (ref 3.5–5.2)
Sodium: 136 mmol/L (ref 134–144)

## 2013-12-10 LAB — DIGOXIN LEVEL: Digoxin Level: 2.2 ng/mL (ref 0.9–2.0)

## 2013-12-11 ENCOUNTER — Encounter (HOSPITAL_COMMUNITY): Payer: Self-pay | Admitting: Emergency Medicine

## 2013-12-11 ENCOUNTER — Observation Stay (HOSPITAL_COMMUNITY)
Admission: EM | Admit: 2013-12-11 | Discharge: 2013-12-14 | Disposition: A | Discharge status: Home / Self Care | Payer: BC Managed Care – PPO | Attending: Internal Medicine | Admitting: Internal Medicine

## 2013-12-11 ENCOUNTER — Ambulatory Visit: Payer: Self-pay

## 2013-12-11 ENCOUNTER — Emergency Department (HOSPITAL_COMMUNITY): Payer: BC Managed Care – PPO

## 2013-12-11 DIAGNOSIS — R001 Bradycardia, unspecified: Secondary | ICD-10-CM

## 2013-12-11 DIAGNOSIS — Z860100 Personal history of colon polyps, unspecified: Secondary | ICD-10-CM | POA: Insufficient documentation

## 2013-12-11 DIAGNOSIS — I5023 Acute on chronic systolic (congestive) heart failure: Secondary | ICD-10-CM | POA: Diagnosis present

## 2013-12-11 DIAGNOSIS — K5080 Crohn's disease of both small and large intestine without complications: Secondary | ICD-10-CM | POA: Insufficient documentation

## 2013-12-11 DIAGNOSIS — E785 Hyperlipidemia, unspecified: Secondary | ICD-10-CM | POA: Diagnosis present

## 2013-12-11 DIAGNOSIS — I5041 Acute combined systolic (congestive) and diastolic (congestive) heart failure: Secondary | ICD-10-CM

## 2013-12-11 DIAGNOSIS — I482 Chronic atrial fibrillation, unspecified: Secondary | ICD-10-CM | POA: Diagnosis present

## 2013-12-11 DIAGNOSIS — E669 Obesity, unspecified: Secondary | ICD-10-CM | POA: Insufficient documentation

## 2013-12-11 DIAGNOSIS — Z7982 Long term (current) use of aspirin: Secondary | ICD-10-CM | POA: Insufficient documentation

## 2013-12-11 DIAGNOSIS — R778 Other specified abnormalities of plasma proteins: Secondary | ICD-10-CM

## 2013-12-11 DIAGNOSIS — I509 Heart failure, unspecified: Secondary | ICD-10-CM | POA: Insufficient documentation

## 2013-12-11 DIAGNOSIS — T460X5A Adverse effect of cardiac-stimulant glycosides and drugs of similar action, initial encounter: Secondary | ICD-10-CM | POA: Insufficient documentation

## 2013-12-11 DIAGNOSIS — Z9189 Other specified personal risk factors, not elsewhere classified: Secondary | ICD-10-CM | POA: Diagnosis present

## 2013-12-11 DIAGNOSIS — N179 Acute kidney failure, unspecified: Secondary | ICD-10-CM | POA: Insufficient documentation

## 2013-12-11 DIAGNOSIS — Z7901 Long term (current) use of anticoagulants: Secondary | ICD-10-CM

## 2013-12-11 DIAGNOSIS — I255 Ischemic cardiomyopathy: Secondary | ICD-10-CM

## 2013-12-11 DIAGNOSIS — I214 Non-ST elevation (NSTEMI) myocardial infarction: Secondary | ICD-10-CM | POA: Insufficient documentation

## 2013-12-11 DIAGNOSIS — Z6839 Body mass index (BMI) 39.0-39.9, adult: Secondary | ICD-10-CM | POA: Insufficient documentation

## 2013-12-11 DIAGNOSIS — I10 Essential (primary) hypertension: Secondary | ICD-10-CM | POA: Insufficient documentation

## 2013-12-11 DIAGNOSIS — I4891 Unspecified atrial fibrillation: Secondary | ICD-10-CM

## 2013-12-11 DIAGNOSIS — R739 Hyperglycemia, unspecified: Secondary | ICD-10-CM

## 2013-12-11 DIAGNOSIS — Z9861 Coronary angioplasty status: Secondary | ICD-10-CM | POA: Insufficient documentation

## 2013-12-11 DIAGNOSIS — E875 Hyperkalemia: Secondary | ICD-10-CM

## 2013-12-11 DIAGNOSIS — E782 Mixed hyperlipidemia: Secondary | ICD-10-CM | POA: Insufficient documentation

## 2013-12-11 DIAGNOSIS — R55 Syncope and collapse: Principal | ICD-10-CM | POA: Insufficient documentation

## 2013-12-11 DIAGNOSIS — I4892 Unspecified atrial flutter: Secondary | ICD-10-CM | POA: Insufficient documentation

## 2013-12-11 DIAGNOSIS — I5022 Chronic systolic (congestive) heart failure: Secondary | ICD-10-CM | POA: Diagnosis present

## 2013-12-11 DIAGNOSIS — R7989 Other specified abnormal findings of blood chemistry: Secondary | ICD-10-CM

## 2013-12-11 DIAGNOSIS — I2510 Atherosclerotic heart disease of native coronary artery without angina pectoris: Secondary | ICD-10-CM | POA: Insufficient documentation

## 2013-12-11 DIAGNOSIS — K219 Gastro-esophageal reflux disease without esophagitis: Secondary | ICD-10-CM | POA: Insufficient documentation

## 2013-12-11 DIAGNOSIS — I2589 Other forms of chronic ischemic heart disease: Secondary | ICD-10-CM | POA: Insufficient documentation

## 2013-12-11 DIAGNOSIS — T460X1A Poisoning by cardiac-stimulant glycosides and drugs of similar action, accidental (unintentional), initial encounter: Secondary | ICD-10-CM

## 2013-12-11 DIAGNOSIS — Z79899 Other long term (current) drug therapy: Secondary | ICD-10-CM | POA: Insufficient documentation

## 2013-12-11 HISTORY — DX: Syncope and collapse: R55

## 2013-12-11 LAB — CBC
HCT: 42.9 % (ref 39.0–52.0)
Hemoglobin: 13.7 g/dL (ref 13.0–17.0)
MCH: 26.4 pg (ref 26.0–34.0)
MCHC: 31.9 g/dL (ref 30.0–36.0)
MCV: 82.8 fL (ref 78.0–100.0)
Platelets: 235 10*3/uL (ref 150–400)
RBC: 5.18 MIL/uL (ref 4.22–5.81)
RDW: 14.7 % (ref 11.5–15.5)
WBC: 8.5 10*3/uL (ref 4.0–10.5)

## 2013-12-11 LAB — PRO B NATRIURETIC PEPTIDE: Pro B Natriuretic peptide (BNP): 5028 pg/mL — ABNORMAL HIGH (ref 0–125)

## 2013-12-11 LAB — DIGOXIN LEVEL: DIGOXIN LVL: 1.8 ng/mL (ref 0.8–2.0)

## 2013-12-11 LAB — I-STAT TROPONIN, ED: TROPONIN I, POC: 0.9 ng/mL — AB (ref 0.00–0.08)

## 2013-12-11 LAB — PROTIME-INR
INR: 1.27 (ref 0.00–1.49)
PROTHROMBIN TIME: 15.6 s — AB (ref 11.6–15.2)

## 2013-12-11 LAB — BASIC METABOLIC PANEL
BUN: 42 mg/dL — ABNORMAL HIGH (ref 6–23)
CHLORIDE: 97 meq/L (ref 96–112)
CO2: 26 mEq/L (ref 19–32)
Calcium: 9.2 mg/dL (ref 8.4–10.5)
Creatinine, Ser: 1.86 mg/dL — ABNORMAL HIGH (ref 0.50–1.35)
GFR calc Af Amer: 43 mL/min — ABNORMAL LOW (ref >90)
GFR calc non Af Amer: 37 mL/min — ABNORMAL LOW (ref >90)
Glucose, Bld: 177 mg/dL — ABNORMAL HIGH (ref 70–99)
Potassium: 5.6 mEq/L — ABNORMAL HIGH (ref 3.7–5.3)
SODIUM: 138 meq/L (ref 137–147)

## 2013-12-11 LAB — TROPONIN I: Troponin I: 0.56 ng/mL (ref <0.30)

## 2013-12-11 MED ORDER — ASPIRIN 81 MG PO CHEW
243.0000 mg | CHEWABLE_TABLET | Freq: Once | ORAL | Status: AC
  Administered 2013-12-11: 243 mg via ORAL
  Filled 2013-12-11: qty 3

## 2013-12-11 MED ORDER — SODIUM CHLORIDE 0.9 % IV SOLN
Freq: Once | INTRAVENOUS | Status: AC
  Administered 2013-12-11: 14:00:00 via INTRAVENOUS

## 2013-12-11 MED ORDER — PANTOPRAZOLE SODIUM 40 MG PO TBEC
40.0000 mg | DELAYED_RELEASE_TABLET | Freq: Every day | ORAL | Status: DC
  Administered 2013-12-12 – 2013-12-14 (×3): 40 mg via ORAL
  Filled 2013-12-11 (×3): qty 1

## 2013-12-11 MED ORDER — SODIUM CHLORIDE 0.9 % IJ SOLN: 3.0000 mL | INTRAMUSCULAR | Status: DC | PRN

## 2013-12-11 MED ORDER — ATORVASTATIN CALCIUM 80 MG PO TABS
80.0000 mg | ORAL_TABLET | Freq: Every day | ORAL | Status: DC
  Administered 2013-12-12 – 2013-12-14 (×3): 80 mg via ORAL
  Filled 2013-12-11 (×3): qty 1

## 2013-12-11 MED ORDER — RIVAROXABAN 20 MG PO TABS
20.0000 mg | ORAL_TABLET | Freq: Every day | ORAL | Status: DC
  Administered 2013-12-11 – 2013-12-13 (×3): 20 mg via ORAL
  Filled 2013-12-11 (×4): qty 1

## 2013-12-11 MED ORDER — SODIUM CHLORIDE 0.9 % IJ SOLN
3.0000 mL | Freq: Two times a day (BID) | INTRAMUSCULAR | Status: DC
  Administered 2013-12-12 – 2013-12-14 (×4): 3 mL via INTRAVENOUS

## 2013-12-11 MED ORDER — ASPIRIN EC 81 MG PO TBEC
81.0000 mg | DELAYED_RELEASE_TABLET | Freq: Every day | ORAL | Status: DC
  Administered 2013-12-12 – 2013-12-14 (×3): 81 mg via ORAL
  Filled 2013-12-11 (×3): qty 1

## 2013-12-11 MED ORDER — ASPIRIN 81 MG PO CHEW: 162.0000 mg | CHEWABLE_TABLET | Freq: Once | ORAL | Status: DC

## 2013-12-11 MED ORDER — NITROGLYCERIN 0.4 MG SL SUBL: 0.4000 mg | SUBLINGUAL_TABLET | SUBLINGUAL | Status: DC | PRN

## 2013-12-11 MED ORDER — SODIUM CHLORIDE 0.9 % IV SOLN: 250.0000 mL | INTRAVENOUS | Status: DC | PRN

## 2013-12-11 NOTE — Progress Notes (Signed)
Pt received into room 2w14, tele placed on pt, pt in no distress, pt oriented to room and call bell Rickard Rhymes, RN

## 2013-12-11 NOTE — ED Notes (Signed)
Cardiology at bedside.

## 2013-12-11 NOTE — ED Provider Notes (Signed)
EKG:  Rhythm: normal sinus Rate: 66 PR: 145 ms QRS: 10m QTc: 399 Anterior and lateral q waves ST segments: NS ST changes Comparison: no significant change from prior       SVirgel Manifold MD 12/20/13 1041

## 2013-12-11 NOTE — ED Notes (Addendum)
Pt comes from home, this morning he stood up to go to the bathroom, had syncopal episode. Denies injury from collapse. When EMS arrived HR 30, systolic BP 70. Was given 0.5 atropine, HR increased to 40. Given another 0.5 atropine HR increased to 60. BP 85/61. Pt is wearing life vest device. Reports is seen in [name], was recently discharged and may need a pacemaker. Pt is a x 4, appears pale. Denies CP. HR 65 currently. Was given 500 CC NS

## 2013-12-11 NOTE — ED Notes (Signed)
PA made aware of pt's BP. Pt denies CP. Is a x 4

## 2013-12-11 NOTE — ED Provider Notes (Signed)
CSN: [id number]     Arrival date & time 12/11/13  1220 History   First MD Initiated Contact with Patient 12/11/13 1222     Chief Complaint  Patient presents with  . Bradycardia   HPI  Colton Lamb is a 63 y.o. male with a PMH of chronic systolic heart failure (EF 20-25%), atrial fibrillation, MI, HTN, CAD s/p stent, GERD, and crohn's who presents to the ED for evaluation of bradycardia and syncope. History was provided by the patient. Patient states that PTA he went from a sitting to standing position and felt lightheaded. He states he passed out and slumped onto his side against the wall. Denies head injury or other trauma. States woke up slumped against the wall. No headache, vision changes, weakness, loss of sensation, slurred speech, or numbness/tingling. States was out <1 minute. Did not ambulate after incident. Family called 911 and when EMS arrived patient's HR was 30. Patient was given 0.5 atropine and HR increased to 40. He was given another round of atropine and his HR increased to 60. Patient is currently wearing a Life vest device and does not believe it shocked him. He denies any pain and is asymptomatic currently. His cardiologist is Dr. Larey Dresser. He denies any recent fevers, chills, change in appetite/activity, abdominal pain, nausea, emesis, leg swelling, diarrhea, or constipation. Has been taking his medications with no missed doses. Patient currently on carvedilol (12.5 mg BID) and Xarelto (20 mg).      Past Medical History  Diagnosis Date  . Crohn's ileocolitis   . GERD (gastroesophageal reflux disease)   . Hx of adenomatous colonic polyps 11/2003  . Coronary artery disease     s/p stent placement  . MI (myocardial infarction) 2007  . Hypertension   . Obesity   . Atrial fibrillation   . Chronic systolic heart failure     a) EF 20-25%, grade III DD, RV mildly dilated and sys fx mild/mod reduced (10/2013)  . Ischemic cardiomyopathy     a) LHC (10/2013): LAD stent  occlusion, duration unknown. QS pattern across precordium on EKG suggest not acute, moderate distal RCA obstruction and widely patent circumflex coronary artery  . Atrial flutter     a) s/p Cardioversion 11/22/13, on amiodarone and Xarelto   Past Surgical History  Procedure Laterality Date  . 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 Cardiac catheterization  08/2006    drug-eluting stent   . Hydrocele excision      bilateral  . Foot surgery      left, bone spur   Family History  Problem Relation Age of Onset  . Breast cancer Mother   . Heart disease Father   . Colon cancer Neg Hx    History  Substance Use Topics  . Smoking status: Never Smoker   . Smokeless tobacco: Never Used  . Alcohol Use: No    Review of Systems  Constitutional: Negative for fever, chills, activity change, appetite change and fatigue.  Respiratory: Negative for cough and shortness of breath.   Cardiovascular: Negative for chest pain and leg swelling.  Gastrointestinal: Negative for nausea, vomiting, abdominal pain, diarrhea and constipation.  Genitourinary: Negative for difficulty urinating.  Musculoskeletal: Negative for back pain and neck pain.  Skin: Negative for wound.  Neurological: Positive for syncope and light-headedness (resolved). Negative for dizziness, facial asymmetry, speech difficulty, weakness, numbness and headaches.    Allergies  Iodine and Tetracycline  Home Medications   Prior to  Admission medications   Medication Sig Start Date End Date Taking? Authorizing Provider  amiodarone (PACERONE) 200 MG tablet Take 1 tablet (200 mg total) by mouth 2 (two) times daily. 11/30/13   Rhonda G Barrett, PA-C  aspirin 81 MG tablet Take 81 mg by mouth daily.      Historical Provider, MD  atorvastatin (LIPITOR) 80 MG tablet TAKE 1 TABLET BY MOUTH EVERY DAY 02/04/13   Minna Merritts, MD  carvedilol (COREG) 12.5 MG tablet Take 1 tablet (12.5 mg total) by mouth 2 (two)  times daily with a meal. 11/30/13   Rhonda G Barrett, PA-C  digoxin (LANOXIN) 0.25 MG tablet TAKE 1 TABLET (0.25 MG TOTAL) BY MOUTH DAILY. 11/30/13   Rhonda G Barrett, PA-C  isosorbide mononitrate (IMDUR) 30 MG 24 hr tablet TAKE 1 TABLET (30 MG TOTAL) BY MOUTH DAILY. 11/14/12   Minna Merritts, MD  lisinopril (PRINIVIL,ZESTRIL) 5 MG tablet Take 1 tablet (5 mg total) by mouth 2 (two) times daily. 11/30/13   Rhonda G Barrett, PA-C  Mesalamine (ASACOL HD) 800 MG TBEC Take 1 tablet (800 mg total) by mouth 3 (three) times daily. 08/10/12   Ladene Artist, MD  niacin (NIASPAN) 1000 MG CR tablet TAKE 1 TABLET BY MOUTH AT BEDTIME 08/22/13   Minna Merritts, MD  nitroGLYCERIN (NITROSTAT) 0.4 MG SL tablet Place 0.4 mg under the tongue every 5 (five) minutes as needed.      Historical Provider, MD  omeprazole (PRILOSEC) 20 MG capsule Take 20 mg by mouth daily.      Historical Provider, MD  rivaroxaban (XARELTO) 20 MG TABS tablet Take 1 tablet (20 mg total) by mouth daily with lunch. 11/30/13   Rhonda G Barrett, PA-C   BP 102/57  Pulse 64  Temp(Src) 98.1 F (36.7 C) (Oral)  Resp 18  SpO2 100%  Filed Vitals:   12/11/13 1316 12/11/13 1330 12/11/13 1400 12/11/13 1430  BP: 87/53 92/45 101/52 95/52  Pulse:  59 59 62  Temp:      TempSrc:      Resp: 17 20 16 21   SpO2: 98% 99% 100% 100%    Physical Exam  Nursing note and vitals reviewed. Constitutional: He is oriented to person, place, and time. He appears well-developed and well-nourished. No distress.  HENT:  Head: Normocephalic and atraumatic.  Right Ear: External ear normal.  Left Ear: External ear normal.  Nose: Nose normal.  Mouth/Throat: Oropharynx is clear and moist.  No tenderness to the scalp or face throughout. No palpable hematoma, step-offs, or lacerations throughout.  TM's obstructed by cerumen bilaterally. Mucus membranes are dry.   Eyes: Conjunctivae and EOM are normal. Pupils are equal, round, and reactive to light. Right eye exhibits no  discharge. Left eye exhibits no discharge.  Neck: Normal range of motion. Neck supple.  No cervical spinal or paraspinal tenderness to palpation throughout.  No limitations with neck ROM.    Cardiovascular: Regular rhythm, normal heart sounds and intact distal pulses.  Exam reveals no gallop and no friction rub.   No murmur heard. Bradycardic  Pulmonary/Chest: Effort normal and breath sounds normal. No respiratory distress. He has no wheezes. He has no rales. He exhibits no tenderness.  LifeVest in place  Abdominal: Soft. He exhibits no distension and no mass. There is no tenderness. There is no rebound and no guarding.  Musculoskeletal: Normal range of motion. He exhibits no edema and no tenderness.  No LE edema or calf tenderness bilaterally. Strength 5/5 in  the upper and lower extremities bilaterally. No tenderness to palpation to the UE or LE throughout.   Neurological: He is alert and oriented to person, place, and time.  GCS 15.  No focal neurological deficits.  CN 2-12 intact.  No pronator drift.  Finger to nose intact.  Heel to shin intact.    Skin: Skin is warm and dry. He is not diaphoretic.    ED Course  Procedures (including critical care time) Labs Review Labs Reviewed  [place], ED    Imaging Review Dg Chest Port 1 View  12/11/2013   CLINICAL DATA:  Dizziness and hypertension.  EXAM: PORTABLE CHEST - 1 VIEW  COMPARISON:  3/26/8  FINDINGS: Heart size is enlarged. The lung volumes are low. There is no pleural effusion or edema. No airspace consolidation identified.  IMPRESSION: 1. Cardiac enlargement. 2. No acute findings.   Electronically Signed   By: Kerby Moors M.D.   On: 12/11/2013 13:22     EKG Interpretation None      Results for orders placed during the hospital encounter of 12/11/13  CBC      Result Value Ref Range   WBC 8.5  4.0 - 10.5 K/uL   RBC 5.18  4.22 - 5.81 MIL/uL    Hemoglobin 13.7  13.0 - 17.0 g/dL   HCT 42.9  39.0 - 52.0 %   MCV 82.8  78.0 - 100.0 fL   MCH 26.4  26.0 - 34.0 pg   MCHC 31.9  30.0 - 36.0 g/dL   RDW 14.7  11.5 - 15.5 %   Platelets 235  150 - 400 K/uL  BASIC METABOLIC PANEL      Result Value Ref Range   Sodium 138  137 - 147 mEq/L   Potassium 5.6 (*) 3.7 - 5.3 mEq/L   Chloride 97  96 - 112 mEq/L   CO2 26  19 - 32 mEq/L   Glucose, Bld 177 (*) 70 - 99 mg/dL   BUN 42 (*) 6 - 23 mg/dL   Creatinine, Ser 1.86 (*) 0.50 - 1.35 mg/dL   Calcium 9.2  8.4 - 10.5 mg/dL   GFR calc non Af Amer 37 (*) >90 mL/min   GFR calc Af Amer 43 (*) >90 mL/min  PRO B NATRIURETIC PEPTIDE      Result Value Ref Range   Pro B Natriuretic peptide (BNP) 5028.0 (*) 0 - 125 pg/mL  PROTIME-INR      Result Value Ref Range   Prothrombin Time 15.6 (*) 11.6 - 15.2 seconds   INR 1.27  0.00 - 1.49  DIGOXIN LEVEL      Result Value Ref Range   Digoxin Level 1.8  0.8 - 2.0 ng/mL  I-STAT TROPOININ, ED      Result Value Ref Range   Troponin i, poc 0.90 (*) 0.00 - 0.08 ng/mL   Comment NOTIFIED PHYSICIAN     Comment 3              MDM   Colton Lamb is a 63 y.o. male with a PMH of chronic systolic heart failure (EF 20-25%), atrial fibrillation, MI, HTN, CAD s/p stent, GERD, and crohn's who presents to the ED for evaluation of bradycardia and syncope. Etiology of syncopal episodes possibly vasovagal vs cardiac in nature. Patient passed out when he went from a sitting to standing position. He also was found to have an elevated troponin  of 0.9. No chest pain. EKG negative for any acute ischemic changes and is unchanged from previous. Chest x-ray negative for an acute cardiopulmonary process, however, showed cardiomegaly. Patient was bradycardic in the 60's throughout his ED visit, however, was asymptomatic. Patient is currently on a betablocker (carvedilol). He also was found to have elevated hyperkalemia (5.6) and hyperglycemia (177) as well as AKI vs CKD (1.86). Patient  was mildly hypotensive throughout his ED visit (65-035'W systolic) and was started on continuous fluids. Patient admitted for further evaluation and management.    Consults  Spoke with Gilmore Laroche with [facility] who will evaluate the patient.       Final impressions: 1. Syncope   2. Bradycardia   3. Elevated troponin   4. Hyperkalemia   5. Hyperglycemia   6. AKI (acute kidney injury)   7. Atrial fibrillation   8. Chronic systolic heart failure       Mercy Moore PA-C   This patient was discussed with Dr. Alysia Penna, PA-C 12/12/13 [phone]

## 2013-12-11 NOTE — H&P (Signed)
Patient ID: Colton Lamb MRN: [id number], DOB/AGE: 1950/12/29   Admit date: 12/11/2013   Primary Physician: Mendel Ryder, MD Primary Cardiologist: Dr. Rockey Situ  Pt. Profile: Colton Lamb is a 63 y.o. obese male with a history of PAF on Xarelto, chronic systolic CHF (EF 30-09%), ICM with life vest, CAD s/p stenting x3 in LAD, RCA and Lcx ~2007/2008, and recent NSTEMI in the setting of atrial flutter s/p cardioversion (11/22/13) who presented to the [facility] ED for evaluation of bradycardia and syncope.  Patient woke up this morning in his usual state of health. He had breakfast and went to the store and was feeing fine. He got home around 11am and reported feeling odd. He was sitting in his arm chair when he began to feel flushed, lightheaded, and had blurry vision. He got up to go to the bathroom and remembers "blacking out", but it was unclear if he actually lost consciousness. He says he slumped down the wall. He states that he was down <1 minute and says he remembers the whole episode. He denied CP, SOB, palpitations, headache, bladder/stool incontinence, slurred speech, motor dysfunction, or nausea/vomitting. His family, who was in the other room at the time of the fall, called 911. When EMS arrived, patient's HR was 30. Patient was given 0.5 atropine and HR increased to 40. He was given another round of atropine and his HR increased to 60. Patient is currently wearing a Life vest device and does not believe it shocked him. He denies any pain and is asymptomatic currently.   Of note, the patient had taken all of his medications this morning with the exception of Lasix and spironolactone that has been held since 12/06/2013 per his CHF provider due to elevated Creatinine (up 3.51 from 1.54). He weighed himself this morning as well and states he was at baseline (285-290 lbs).    Problem List  Past Medical History  Diagnosis Date  . Crohn's ileocolitis   . GERD (gastroesophageal reflux  disease)   . Hx of adenomatous colonic polyps 11/2003  . Coronary artery disease     s/p stent placement  . MI (myocardial infarction) 2007  . Hypertension   . Obesity   . Atrial fibrillation   . Chronic systolic heart failure     a) EF 20-25%, grade III DD, RV mildly dilated and sys fx mild/mod reduced (10/2013)  . Ischemic cardiomyopathy     a) LHC (10/2013): LAD stent occlusion, duration unknown. QS pattern across precordium on EKG suggest not acute, moderate distal RCA obstruction and widely patent circumflex coronary artery  . Atrial flutter     a) s/p Cardioversion 11/22/13, on amiodarone and Xarelto    Past Surgical History  Procedure Laterality Date  . 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 Cardiac catheterization  08/2006    drug-eluting stent   . Hydrocele excision      bilateral  . Foot surgery      left, bone spur     Allergies  Allergies  Allergen Reactions  . Iodine   . Tetracycline     Home Medications  Prior to Admission medications   Medication Sig Start Date End Date Taking? Authorizing Provider  amiodarone (PACERONE) 200 MG tablet Take 1 tablet (200 mg total) by mouth 2 (two) times daily. 11/30/13  Yes Rhonda G Barrett, PA-C  aspirin 81 MG tablet Take 81 mg by mouth daily.     Yes Historical Provider, MD  atorvastatin (LIPITOR) 80 MG tablet Take 80 mg by mouth daily.   Yes Historical Provider, MD  carvedilol (COREG) 12.5 MG tablet Take 1 tablet (12.5 mg total) by mouth 2 (two) times daily with a meal. 11/30/13  Yes Rhonda G Barrett, PA-C  digoxin (LANOXIN) 0.25 MG tablet Take 0.25 mg by mouth daily. TAKE 1 TABLET (0.25 MG TOTAL) BY MOUTH DAILY. 11/30/13  Yes Rhonda G Barrett, PA-C  isosorbide mononitrate (IMDUR) 30 MG 24 hr tablet Take 30 mg by mouth daily.   Yes Historical Provider, MD  lisinopril (PRINIVIL,ZESTRIL) 5 MG tablet Take 1 tablet (5 mg total) by mouth 2 (two) times daily. 11/30/13  Yes Rhonda G Barrett, PA-C  Mesalamine  (ASACOL HD) 800 MG TBEC Take 1 tablet (800 mg total) by mouth 3 (three) times daily. 08/10/12  Yes Ladene Artist, MD  niacin (NIASPAN) 1000 MG CR tablet Take 1,000 mg by mouth at bedtime.   Yes Historical Provider, MD  nitroGLYCERIN (NITROSTAT) 0.4 MG SL tablet Place 0.4 mg under the tongue every 5 (five) minutes as needed.     Yes Historical Provider, MD  omeprazole (PRILOSEC) 20 MG capsule Take 20 mg by mouth daily.     Yes Historical Provider, MD  rivaroxaban (XARELTO) 20 MG TABS tablet Take 1 tablet (20 mg total) by mouth daily with lunch. 11/30/13  Yes Rhonda G Barrett, PA-C  spironolactone (ALDACTONE) 25 MG tablet Take 12.5 mg by mouth daily.   Yes Historical Provider, MD    Family History  Family History  Problem Relation Age of Onset  . Breast cancer Mother   . Heart disease Father   . Colon cancer Neg Hx     Social History  History   Social History  . Marital Status: Married    Spouse Name: N/A    Number of Children: 1  . Years of Education: N/A   Occupational History  . retired    Social History Main Topics  . Smoking status: Never Smoker   . Smokeless tobacco: Never Used  . Alcohol Use: No  . Drug Use: No  . Sexual Activity: Not on file   Other Topics Concern  . Not on file   Social History Narrative  . No narrative on file     All other systems reviewed and are otherwise negative except as noted above.  Physical Exam  Blood pressure 103/65, pulse 69, temperature 98.1 F (36.7 C), temperature source Oral, resp. rate 24, SpO2 100.00%.  General: Pleasant, NAD, obese, poor hygiene  Psych: Normal affect. Neuro: Alert and oriented X 3. Moves all extremities spontaneously. HEENT: Normal  Neck: Supple without bruits or JVD. Lungs:  Resp regular and unlabored, CTA. Heart: RRR no s3, s4, or murmurs. Abdomen: Soft, non-tender, non-distended, BS + x 4.  Extremities: No clubbing, cyanosis or edema. DP/PT/Radials 2+ and equal bilaterally.  Labs  No results  found for this basename: CKTOTAL, CKMB, TROPONINI,  in the last 72 hours Lab Results  Component Value Date   WBC 8.5 12/11/2013   HGB 13.7 12/11/2013   HCT 42.9 12/11/2013   MCV 82.8 12/11/2013   PLT 235 12/11/2013   Troponin i, poc  0.90   12/11/2013    Recent Labs Lab 12/11/13 1245  NA 138  K 5.6*  CL 97  CO2 26  BUN 42*  CREATININE 1.86*  CALCIUM 9.2  GLUCOSE 177*   Lab Results  Component Value Date   CHOL 131 11/23/2013   HDL 41  11/23/2013   LDLCALC 79 11/23/2013   TRIG 54 11/23/2013      Radiology/Studies  Dg Chest Port 1 View  12/11/2013   CLINICAL DATA:  Dizziness and hypertension.  EXAM: PORTABLE CHEST - 1 VIEW  COMPARISON:  3/26/8  FINDINGS: Heart size is enlarged. The lung volumes are low. There is no pleural effusion or edema. No airspace consolidation identified.  IMPRESSION: 1. Cardiac enlargement. 2. No acute findings.    Left Heart Catheterization with Coronary Angiography Report  Colton Lamb  63 y.o.  male  29-Aug-1950  Procedure Date: 11/22/2013   INDICATIONS: Non-ST elevation MI in setting of atrial flutter of unknown duration. No chest discomfort is present at the time of the procedure. He denies dyspnea and is able to lie flat. He underwent urgent electrical cardioversion approximately 3 hours ago.  ANGIOGRAPHIC DATA: The left main coronary artery is widely patent.  The left anterior descending artery is totally occluded proximally within the previously placed stent.  The left circumflex artery is widely patent.  The right coronary artery is right coronary is codominant. The distal RCA leading into the PDA contains segmental 70% stenosis.  LEFT VENTRICULOGRAM: Left ventricular angiogram was done in the 30 RAO projection and revealed a dilated LV and severe dysfunction. We only performed hand injection due to the severely elevated LVEDP. I would estimate that the patient's LV EF would be 20% or less.  IMPRESSIONS: 1. LAD stent occlusion, duration unknown.  QS pattern across the precordium on EKG suggested this is not in acute.  2. Moderate distal right coronary obstruction and widely patent circumflex coronary artery  3. Severe left ventricular systolic function, decompensated with severe elevation in LVEDP. I suspect LV systolic dysfunction is mixed in etiology including the results of anterior infarction of unknown timing but presumed recent and/or a component of tachycardia induced LV dysfunction.  4. Atrial flutter of unknown duration  5. Non-ST elevation myocardial infarction, difficult to sort out relative contribution of demand related to tachycardia versus obstructive disease and timing of LAD occlusion. The patient is totally pain-free at this time. He states the chest pain started last night at midnight. He is not dyspneic and his blood pressure is controlled.  RECOMMENDATION: Aggressive medical therapy of LV systolic dysfunction     ECG Rhythm: normal sinus  Rate: 66  PR: 145 ms  QRS: 83m  QTc: 399  Anterior and lateral q waves  ST segments: NS ST changes  Comparison: no significant change from prior   ASSESSMENT AND PLAN Colton Lamb a 63y.o. obese male with a history of PAF on Xarelto, chronic systolic CHF (EF 274-25%, ICM with life vest, CAD s/p stenting x3 in LAD, RCA and Lcx ~2007/2008, and recent NSTEMI in the setting of atrial flutter s/p cardioversion (11/22/13) [facility]. ECG from field shows junctional bradycardia in setting of digoxin 1.8.  Syncope and bradycardia- Patient reports that he felt lightheaded while sitting and then had a syncopal episode after standing to walk to the bathroom (unknown whether true LOC).   CAD- recent NSTEMI (trop >20) in the setting of rapid atrial flutter. LHC after DCCV which revealed an occluded LAD stent of unknown duration and moderate distal RCA occlusion. No PCI performed on 11/22/13. Continued medical management  --  Continue aspirin 81 and Xarelto for the time being. Plavix discontinued with initiation of Xarelto.  -- He has had no chest pain. Troponin mildly  elevated at 0.9.  Atrial fibrillation- currently in NSR. Continue Xarelto. Will hold amiodarone, digoxin and a beta blocker for now,   Ischemic cardiomyopathy:  -- Stable. No active HF at this time.   Chronic systolic HF: ICM, EF 88-67%, grade III DD, RV mildly dilated and sys fx mild/mod reduced (10/2013)  - stale. Hold ACE & b-blocker at this time.   Junctional Bradycardia: - due to dig toxicity. Hold dig, lasix, amio and BB.  Acute renal failure, stage III; -improving with hydration   Signed, Perry Mount, PA-C 12/11/2013, 4:32 PM  Pager [phone]  Patient seen and examined with Lorretta Harp PA-C. We discussed all aspects of the encounter. I agree with the assessment and plan as stated above.   Suspect syncopal episode due to mild volume depletion and junctional bradycardia in setting of dig toxicity. Now back in SR. Will bring in for 24 hour observation. Hydrate gently with 1L NS (he is not orthostatic currently). Stop digoxin. Hold BB, ACE , amio and spiro for now. Will resume gently in am. Continue Xarelto for AF.  Shaune Pascal Bensimhon,MD 5:53 PM

## 2013-12-11 NOTE — ED Notes (Signed)
Phineas Douglas, PA ws informed of abnormal lab test results

## 2013-12-12 ENCOUNTER — Encounter (HOSPITAL_COMMUNITY): Payer: Self-pay | Admitting: General Practice

## 2013-12-12 DIAGNOSIS — I5041 Acute combined systolic (congestive) and diastolic (congestive) heart failure: Secondary | ICD-10-CM

## 2013-12-12 DIAGNOSIS — I498 Other specified cardiac arrhythmias: Secondary | ICD-10-CM

## 2013-12-12 DIAGNOSIS — I509 Heart failure, unspecified: Secondary | ICD-10-CM

## 2013-12-12 LAB — BASIC METABOLIC PANEL
BUN: 44 mg/dL — ABNORMAL HIGH (ref 6–23)
BUN: 42 mg/dL — AB (ref 6–23)
CO2: 26 mEq/L (ref 19–32)
CO2: 29 meq/L (ref 19–32)
Calcium: 8.9 mg/dL (ref 8.4–10.5)
Calcium: 9 mg/dL (ref 8.4–10.5)
Chloride: 97 mEq/L (ref 96–112)
Chloride: 99 mEq/L (ref 96–112)
Creatinine, Ser: 1.88 mg/dL — ABNORMAL HIGH (ref 0.50–1.35)
Creatinine, Ser: 1.78 mg/dL — ABNORMAL HIGH (ref 0.50–1.35)
GFR calc Af Amer: 43 mL/min — ABNORMAL LOW (ref >90)
GFR calc non Af Amer: 37 mL/min — ABNORMAL LOW (ref >90)
GFR calc non Af Amer: 39 mL/min — ABNORMAL LOW (ref >90)
GFR, EST AFRICAN AMERICAN: 45 mL/min — AB (ref >90)
Glucose, Bld: 134 mg/dL — ABNORMAL HIGH (ref 70–99)
Glucose, Bld: 122 mg/dL — ABNORMAL HIGH (ref 70–99)
Potassium: 5.8 mEq/L — ABNORMAL HIGH (ref 3.7–5.3)
Potassium: 5.3 mEq/L (ref 3.7–5.3)
SODIUM: 139 meq/L (ref 137–147)
Sodium: 136 mEq/L — ABNORMAL LOW (ref 137–147)

## 2013-12-12 LAB — DIGOXIN LEVEL: Digoxin Level: 1.9 ng/mL (ref 0.8–2.0)

## 2013-12-12 LAB — TSH: TSH: 10.2 u[IU]/mL — ABNORMAL HIGH (ref 0.350–4.500)

## 2013-12-12 LAB — MAGNESIUM: Magnesium: 1.9 mg/dL (ref 1.5–2.5)

## 2013-12-12 LAB — TROPONIN I
TROPONIN I: 0.52 ng/mL — AB (ref <0.30)
Troponin I: 0.5 ng/mL (ref <0.30)

## 2013-12-12 MED ORDER — MESALAMINE 400 MG PO CPDR
800.0000 mg | DELAYED_RELEASE_CAPSULE | Freq: Three times a day (TID) | ORAL | Status: DC
Start: 2013-12-12 — End: 2013-12-14
  Administered 2013-12-12 – 2013-12-14 (×6): 800 mg via ORAL
  Filled 2013-12-12 (×8): qty 2

## 2013-12-12 NOTE — Progress Notes (Signed)
MD made aware of pt positive Troponin's, but stated "no change in plan of care at this time" as may be due to MI pt experienced in the past month.  Pt resting in bed with Zoll vest on, as he fells more comfortable with it on, call light in reach, RN will continue to monitor.   Nolon Nations, RN

## 2013-12-12 NOTE — Progress Notes (Signed)
UR completed 

## 2013-12-12 NOTE — Progress Notes (Signed)
Pt. Seen and examined. Agree with the NP/PA-C note as written.  Feeling much better with gentle hydration. Digoxin is on hold due to toxic levels - i think the risk/benefit ratio for continuing this long-term is not favorable. Would hold on ACE-I until renal function improves.  Can probably start low dose b-blocker tomorrow. Mild troponin elevations are stable, likely secondary to cardiomyopathy and occluded stent (recently).  Continue LifeVest - likely an ICD candidate in the future.  Pixie Casino, MD, [facility] Attending Cardiologist [place]

## 2013-12-12 NOTE — Progress Notes (Signed)
Patient Profile: Colton Lamb is a 63 y.o. obese male with a history of PAF on Xarelto, chronic systolic CHF (EF 73-22%), ICM with life vest, CAD s/p stenting x3 in LAD, RCA and Lcx ~2007/2008, and recent NSTEMI in the setting of atrial flutter s/p cardioversion (11/22/13) who presented to the [facility] ED for evaluation of bradycardia and syncope. ECG from field shows junctional bradycardia in setting of digoxin 1.8.  Subjective: Feel much better today. Denies any further syncopal episodes. Denies near syncope, palpitations, dizziness, CP and SOB.   Objective: Vital signs in last 24 hours: Temp:  [97.9 F (36.6 C)-98.7 F (37.1 C)] 97.9 F (36.6 C) (05/14 0407) Pulse Rate:  [59-71] 71 (05/14 0407) Resp:  [16-26] 16 (05/14 0407) BP: (87-126)/(41-72) 124/66 mmHg (05/14 0407) SpO2:  [98 %-100 %] 99 % (05/14 0407) Weight:  [291 lb 8 oz (132.224 kg)-292 lb 8 oz (132.677 kg)] 292 lb 8 oz (132.677 kg) (05/14 0407) Last BM Date: 12/11/13  Intake/Output from previous day: 05/13 0701 - 05/14 0700 In: 240 [P.O.:240] Out: 875 [Urine:875] Intake/Output this shift: Total I/O In: 240 [P.O.:240] Out: -   Medications Current Facility-Administered Medications  Medication Dose Route Frequency Provider Last Rate Last Dose  . 0.9 %  sodium chloride infusion  250 mL Intravenous PRN Perry Mount, PA-C      . aspirin EC tablet 81 mg  81 mg Oral Daily Perry Mount, PA-C   81 mg at 12/12/13 1017  . atorvastatin (LIPITOR) tablet 80 mg  80 mg Oral Daily Perry Mount, PA-C   80 mg at 12/12/13 1018  . nitroGLYCERIN (NITROSTAT) SL tablet 0.4 mg  0.4 mg Sublingual Q5 min PRN Perry Mount, PA-C      . pantoprazole (PROTONIX) EC tablet 40 mg  40 mg Oral Daily Perry Mount, PA-C   40 mg at 12/12/13 1017  . rivaroxaban (XARELTO) tablet 20 mg  20 mg Oral Q supper Perry Mount, PA-C   20 mg at 12/11/13 2352  . sodium chloride 0.9 % injection 3 mL  3 mL Intravenous Q12H Perry Mount, PA-C   3 mL at 12/12/13 1000   . sodium chloride 0.9 % injection 3 mL  3 mL Intravenous PRN Perry Mount, PA-C        PE: General appearance: alert, cooperative, no distress and moderately obese Neck: no JVD Heart: regular rate and rhythm and distant HS Extremities: trace LEE Pulses: 2+ and symmetric Skin: warm and dry Neurologic: Grossly normal  Lab Results:   Recent Labs  12/11/13 1245  WBC 8.5  HGB 13.7  HCT 42.9  PLT 235   BMET  Recent Labs  12/11/13 1245 12/12/13 0013 12/12/13 0320  NA 138 136* 139  K 5.6* 5.8* 5.3  CL 97 97 99  CO2 26 29 26   GLUCOSE 177* 134* 122*  BUN 42* 44* 42*  CREATININE 1.86* 1.88* 1.78*  CALCIUM 9.2 8.9 9.0   PT/INR  Recent Labs  12/11/13 1245  LABPROT 15.6*  INR 1.27    Assessment/Plan  Active Problems:   Syncope  Syncope and bradycardia- Suspect syncopal episode was due to mild volume depletion and junctional bradyarthria in the setting of dig toxicity. IVFs given overnight. Digoxin discontinued. BB, ACE, amio and spironolactone all on hold. Pt feeling much better. No further syncope/ near syncope. NSR on telemetry. HR in the 70s. BB stable. Will need to slowly add back HF meds.   CAD- recent NSTEMI (trop >20) in the setting of rapid atrial  flutter. LHC after DCCV which revealed an occluded LAD stent of unknown duration and moderate distal RCA occlusion. No PCI performed on 11/22/13. Continued medical management  -- Continue aspirin 81 and Xarelto for the time being. Plavix discontinued with initiation of Xarelto.  -- He has had no chest pain. Troponin continues to trend downward.  Atrial fibrillation- currently in NSR w/ HR in the 70s. Continue Xarelto.  amiodarone, digoxin and a beta blocker currently on hold  Ischemic cardiomyopathy:  -- Stable. No active HF at this time. He is off BB, ACE and spironolactone. Will need to gradually add back slowly as HR and BP allows. Continue with LifeVest.  Chronic systolic HF: ICM, EF 35-52%, grade III DD, RV  mildly dilated and sys fx mild/mod reduced (10/2013)  - stale.   Junctional Bradycardia:  - due to dig toxicity. Digoxin was discontinued. BB and amio still on hold.   Acute renal failure, stage III;  -improving with hydration. Scr is 1.78 (down from 1.88).     LOS: 1 day    Brittainy M. Rosita Fire, PA-C 12/12/2013 11:30 AM

## 2013-12-13 DIAGNOSIS — I2589 Other forms of chronic ischemic heart disease: Secondary | ICD-10-CM

## 2013-12-13 LAB — BASIC METABOLIC PANEL
BUN: 30 mg/dL — AB (ref 6–23)
CALCIUM: 8.8 mg/dL (ref 8.4–10.5)
CO2: 26 mEq/L (ref 19–32)
Chloride: 99 mEq/L (ref 96–112)
Creatinine, Ser: 1.42 mg/dL — ABNORMAL HIGH (ref 0.50–1.35)
GFR calc Af Amer: 60 mL/min — ABNORMAL LOW (ref >90)
GFR calc non Af Amer: 51 mL/min — ABNORMAL LOW (ref >90)
GLUCOSE: 125 mg/dL — AB (ref 70–99)
Potassium: 5 mEq/L (ref 3.7–5.3)
SODIUM: 137 meq/L (ref 137–147)

## 2013-12-13 MED ORDER — CARVEDILOL 3.125 MG PO TABS
3.1250 mg | ORAL_TABLET | Freq: Two times a day (BID) | ORAL | Status: DC
  Administered 2013-12-13 – 2013-12-14 (×2): 3.125 mg via ORAL
  Filled 2013-12-13 (×4): qty 1

## 2013-12-13 NOTE — Progress Notes (Signed)
Pt. Seen and examined. Agree with the NP/PA-C note as written.  Symptomatically improved. Creatinine is improving. Agree with starting back low-dose b-blocker today as HR has rebounded.  Would wait on restarting ACE-I until after discharge due to recent renal insufficiency. Continue to hold amiodarone and aldactone. Will need to restart diuretic prior to discharge.  Anticipate possible d/c tomorrow.  Pixie Casino, MD, [facility] Attending Cardiologist [place]

## 2013-12-13 NOTE — Discharge Instructions (Addendum)
Weigh daily.  Call Dr. Rockey Situ if your weight increases 3 lbs in one day or 5 lbs in one week or you notice leg swelling or increased shortness of breath.     Information on my medicine - XARELTO (Rivaroxaban) Why was Xarelto prescribed for you? Xarelto was prescribed for you to reduce the risk of a blood clot forming that can cause a stroke if you have a medical condition called atrial fibrillation (a type of irregular heartbeat).  What do you need to know about xarelto ? Take your Xarelto ONCE DAILY at the same time every day with your evening meal. If you have difficulty swallowing the tablet whole, you may crush it and mix in applesauce just prior to taking your dose.  Take Xarelto exactly as prescribed by your doctor and DO NOT stop taking Xarelto without talking to the doctor who prescribed the medication.  Stopping without other stroke prevention medication to take the place of Xarelto may increase your risk of developing a clot that causes a stroke.  Refill your prescription before you run out.  After discharge, you should have regular check-up appointments with your healthcare provider that is prescribing your Xarelto.  In the future your dose may need to be changed if your kidney function or weight changes by a significant amount.  What do you do if you miss a dose? If you are taking Xarelto ONCE DAILY and you miss a dose, take it as soon as you remember on the same day then continue your regularly scheduled once daily regimen the next day. Do not take two doses of Xarelto at the same time or on the same day.   Important Safety Information A possible side effect of Xarelto is bleeding. You should call your healthcare provider right away if you experience any of the following:   Bleeding from an injury or your nose that does not stop.   Unusual colored urine (red or dark brown) or unusual colored stools (red or black).   Unusual bruising for unknown reasons.   A serious  fall or if you hit your head (even if there is no bleeding).  Some medicines may interact with Xarelto and might increase your risk of bleeding while on Xarelto. To help avoid this, consult your healthcare provider or pharmacist prior to using any new prescription or non-prescription medications, including herbals, vitamins, non-steroidal anti-inflammatory drugs (NSAIDs) and supplements.  This website has more information on Xarelto: [email or web address]

## 2013-12-13 NOTE — Progress Notes (Signed)
Patient Profile: Colton Lamb is a 63 y.o. obese male with a history of PAF on Xarelto, chronic systolic CHF (EF 75-44%), ICM with life vest, CAD s/p stenting x3 in LAD, RCA and Lcx ~2007/2008, and recent NSTEMI in the setting of atrial flutter s/p cardioversion (11/22/13) who presented to the [facility] ED for evaluation of bradycardia and syncope. ECG from field showed junctional bradycardia in setting of digoxin level of 1.8.  Subjective: Feel much better today. Denies any further syncopal episodes. Denies near syncope, palpitations, dizziness, CP and SOB.   Objective: Vital signs in last 24 hours: Temp:  [98.5 F (36.9 C)-98.7 F (37.1 C)] 98.5 F (36.9 C) (05/15 0348) Pulse Rate:  [70-74] 74 (05/15 0348) Resp:  [18] 18 (05/15 0348) BP: (122-134)/(72-77) 134/72 mmHg (05/15 0348) SpO2:  [100 %] 100 % (05/15 0348) Weight:  [290 lb 12.8 oz (131.906 kg)] 290 lb 12.8 oz (131.906 kg) (05/15 0348) Last BM Date: 12/12/13  Intake/Output from previous day: 05/14 0701 - 05/15 0700 In: 720 [P.O.:720] Out: 1700 [Urine:1700] Intake/Output this shift:    Medications Current Facility-Administered Medications  Medication Dose Route Frequency Provider Last Rate Last Dose  . 0.9 %  sodium chloride infusion  250 mL Intravenous PRN Perry Mount, PA-C      . aspirin EC tablet 81 mg  81 mg Oral Daily Perry Mount, PA-C   81 mg at 12/12/13 1017  . atorvastatin (LIPITOR) tablet 80 mg  80 mg Oral Daily Perry Mount, PA-C   80 mg at 12/12/13 1018  . Mesalamine (ASACOL) DR capsule 800 mg  800 mg Oral TID Lyda Jester, PA-C   800 mg at 12/12/13 2202  . nitroGLYCERIN (NITROSTAT) SL tablet 0.4 mg  0.4 mg Sublingual Q5 min PRN Perry Mount, PA-C      . pantoprazole (PROTONIX) EC tablet 40 mg  40 mg Oral Daily Perry Mount, PA-C   40 mg at 12/12/13 1017  . rivaroxaban (XARELTO) tablet 20 mg  20 mg Oral Q supper Perry Mount, PA-C   20 mg at 12/12/13 1702  . sodium chloride 0.9 % injection 3 mL  3  mL Intravenous Q12H Perry Mount, PA-C   3 mL at 12/12/13 2203  . sodium chloride 0.9 % injection 3 mL  3 mL Intravenous PRN Perry Mount, PA-C        PE: General appearance: alert, cooperative, no distress and moderately obese Neck: no JVD Heart: regular rate and rhythm and distant HS Extremities: no LEE Pulses: 2+ and symmetric Skin: warm and dry Neurologic: Grossly normal  Lab Results:   Recent Labs  12/11/13 1245  WBC 8.5  HGB 13.7  HCT 42.9  PLT 235   BMET  Recent Labs  12/12/13 0013 12/12/13 0320 12/13/13 0328  NA 136* 139 137  K 5.8* 5.3 5.0  CL 97 99 99  CO2 29 26 26   GLUCOSE 134* 122* 125*  BUN 44* 42* 30*  CREATININE 1.88* 1.78* 1.42*  CALCIUM 8.9 9.0 8.8   PT/INR  Recent Labs  12/11/13 1245  LABPROT 15.6*  INR 1.27    Assessment/Plan  Active Problems:   Syncope  Syncope and bradycardia- Suspect syncopal episode was due to mild volume depletion and junctional bradyarthria in the setting of dig toxicity. IVFs given overnight. Digoxin discontinued. BB, ACE, amio and spironolactone all on hold. Pt feeling much better. No further syncope/ near syncope. NSR on telemetry. HR in the 80. BB stable, in the 920-100F sytstolic. Will need  to slowly add back HF meds.   CAD- recent NSTEMI (trop >20) in the setting of rapid atrial flutter. LHC after DCCV which revealed an occluded LAD stent of unknown duration and moderate distal RCA occlusion. No PCI performed on 11/22/13. Continued medical management  -- Continue aspirin 81 and Xarelto for the time being. Plavix discontinued with initiation of Xarelto.  -- He has had no chest pain. Troponin continues to trend downward.  Atrial fibrillation- currently in SR w/ HR in the 80s. He does have PVCs. Will add back low dose BB today. Continue Xarelto.  Continue to hold digoxin and amiodarone for now.   Ischemic cardiomyopathy:  -- Stable. No active HF at this time. He has been off of BB, ACE and spironolactone since  admission. Will need to gradually add HF meds back slowly as HR and BP allows. Will attempt to restart low dose Coreg today. Continue to hold ACE until renal function further improves. Continue with LifeVest.  Chronic systolic HF: ICM, EF 41-28%, grade III DD, RV mildly dilated and sys fx mild/mod reduced (10/2013)  - stable.   Junctional Bradycardia:  - due to dig toxicity. Digoxin was discontinued. No further bradycardia noted. HR currently in the mid 80s with PVCs. Amio still on hold. Will attempt to place back on a low dose BB today. Will initiate 3.125 mg of Coreg BID. Will monitor HR closely with telemetry.   Acute renal failure, stage III;  -improving with hydration. Scr today is 1.42. Continue to hold ACE-I for now.     LOS: 2 days    Rochele Lueck M. Rosita Fire, PA-C 12/13/2013 9:51 AM

## 2013-12-13 NOTE — Care Management Note (Signed)
    Page 1 of 1   12/13/2013     3:55:04 PM CARE MANAGEMENT NOTE 12/13/2013  Patient:  Colton Lamb, Colton Lamb   Account Number:  [id number]  Date Initiated:  12/13/2013  Documentation initiated by:  [facility]  Subjective/Objective Assessment:     Action/Plan:   discharge planning   Anticipated DC Date:  12/14/2013   Anticipated DC Plan:  [place]  CM consult      Choice offered to / List presented to:             Status of service:  In process, will continue to follow Medicare Important Message given?   (If response is "NO", the following Medicare IM given date fields will be blank) Date Medicare IM given:   Date Additional Medicare IM given:    Discharge Disposition:    Per UR Regulation:    If discussed at Long Length of Stay Meetings, dates discussed:    Comments:  12/13/13 15:45 CM met with pt in room to discuss his home arrangements.  Pt states he lives alone(widowed since 2013) but feels he is independent and not in need of any HH at this time.  Pt has Vest. Pt states he is not new to Xarelto.  Pt has a Sleep Study Appointment: [place] [phone] Appointment:  01/05/2014 -8pm. CM will continue to monitor for disposition.  Mariane Masters, BSN, CM [phone].

## 2013-12-14 ENCOUNTER — Encounter (HOSPITAL_COMMUNITY): Payer: Self-pay | Admitting: Physician Assistant

## 2013-12-14 DIAGNOSIS — R7989 Other specified abnormal findings of blood chemistry: Secondary | ICD-10-CM

## 2013-12-14 DIAGNOSIS — N179 Acute kidney failure, unspecified: Secondary | ICD-10-CM

## 2013-12-14 DIAGNOSIS — R001 Bradycardia, unspecified: Secondary | ICD-10-CM

## 2013-12-14 LAB — BASIC METABOLIC PANEL WITH GFR
BUN: 24 mg/dL — ABNORMAL HIGH (ref 6–23)
CO2: 26 meq/L (ref 19–32)
Calcium: 8.8 mg/dL (ref 8.4–10.5)
Chloride: 105 meq/L (ref 96–112)
Creatinine, Ser: 1.32 mg/dL (ref 0.50–1.35)
GFR calc Af Amer: 65 mL/min — ABNORMAL LOW
GFR calc non Af Amer: 56 mL/min — ABNORMAL LOW
Glucose, Bld: 118 mg/dL — ABNORMAL HIGH (ref 70–99)
Potassium: 5.1 meq/L (ref 3.7–5.3)
Sodium: 143 meq/L (ref 137–147)

## 2013-12-14 LAB — T4, FREE: Free T4: 1.08 ng/dL (ref 0.80–1.80)

## 2013-12-14 LAB — T3, FREE: T3, Free: 2.6 pg/mL (ref 2.3–4.2)

## 2013-12-14 MED ORDER — CARVEDILOL 3.125 MG PO TABS: 3.1250 mg | ORAL_TABLET | Freq: Two times a day (BID) | ORAL | Status: DC

## 2013-12-14 NOTE — Progress Notes (Signed)
Utilization review complete 

## 2013-12-14 NOTE — Discharge Summary (Signed)
Discharge Summary   Patient ID: Colton Lamb, MRN: [id number], DOB/AGE: 10/27/1950 63 y.o.  Admit date: 12/11/2013 Discharge date: 12/14/2013   Primary Care Physician:  Mendel Ryder   Primary Cardiologist:  Dr. Ida Rogue    Reason for Admission:  Syncope    Primary Discharge Diagnoses:  Principal Problem:   Syncope - due to bradycardia in setting of Digoxin Toxicity Active Problems:   HYPERLIPIDEMIA-MIXED   CAD S/P percutaneous coronary angioplasty - multiple PCIs   Atrial fibrillation   At risk for sudden cardiac death - on LifeVest   Cardiomyopathy, ischemic   Chronic systolic heart failure   AKI (acute kidney injury) - Creatinine improved at d/c   Elevated TSH - will need f/u TFTs with PCP in 3-4 weeks   Junctional bradycardia - resolved     Wt Readings from Last 3 Encounters:  12/14/13 286 lb 9.6 oz (130 kg)  12/06/13 291 lb 6 oz (132.167 kg)  11/30/13 289 lb 8 oz (131.316 kg)    Secondary Discharge Diagnoses:   Past Medical History  Diagnosis Date  . Crohn's ileocolitis   . GERD (gastroesophageal reflux disease)   . Hx of adenomatous colonic polyps 11/2003  . Coronary artery disease     s/p stent placement  . MI (myocardial infarction) 2007  . Hypertension   . Obesity   . Atrial fibrillation   . Chronic systolic heart failure     a) EF 20-25%, grade III DD, RV mildly dilated and sys fx mild/mod reduced (10/2013)  . Ischemic cardiomyopathy     a) LHC (10/2013): LAD stent occlusion, duration unknown. QS pattern across precordium on EKG suggest not acute, moderate distal RCA obstruction and widely patent circumflex coronary artery  . Atrial flutter     a) s/p Cardioversion 11/22/13, on amiodarone and Xarelto  . Dysrhythmia     ATRIAL FIBRILATION  . Syncope 11/2013    in setting of volume depletion and bradycardia due to dig toxicity       Allergies:    Allergies  Allergen Reactions  . Iodine   . Shrimp [Shellfish Allergy]     SWELLING     HIVES    SHORTNESS OF BREATH  . Tetracycline       Procedures Performed This Admission:   None   Hospital Course:  JAMEIRE Lamb is a 63 y.o. male with a hx of parox AFib on Xarelto, chronic systolic CHF (EF 86-76%), Ischemic CM with LifeVest, CAD s/p stenting x 3 to LAD, RCA and CFX ~ 2007/2008, and recent NSTEMI in the setting of atrial flutter s/p cardioversion (11/22/13 - LHC with LAD stent occlusion of unknown duration, treated medically) who presented to the [facility] ED for evaluation of bradycardia and syncope.  Syncope occurred after standing to go to the BR.  EMS was summoned and HR was 30.  He was given 0.5 atropine x2 and HR increased to 60.  No LifeVest shock was delivered.  Of note, Lasix and Spironolactone had been on hold due to a/c renal failure since 12/06/13.  ECG from the filed demonstrated junctional bradycardia in setting of Digoxin level of 1.8.  Syncope was suspected to be 2/2 mild volume depletion and junctional bradycardia in the setting of dig toxicity.  In the ED, he was back in NSR.  Amiodarone, beta blocker, ACEI, spironolactone were placed on hold.  Digoxin was stopped.  Initial troponin was mildly elevated at 0.9, tut continued to trend down.  He remained in  NSR.  Creatinine continued to improve.   Low dose beta blocker was eventually resumed on 5/15.  He was evaluated this AM by Dr. Carlyle Dolly and felt to be stable for d/c to home.  He was kept off of Amiodarone, digoxin, ACEI, Spironolactone.  It was felt his CHF meds could be slowly resumed as an outpatient.  He is d/c to home in stable condition.      Discharge Vitals:   Blood pressure 120/80, pulse 74, temperature 97.8 F (36.6 C), temperature source Oral, resp. rate 19, height 5' 11"  (1.803 m), weight 286 lb 9.6 oz (130 kg), SpO2 99.00%.   Labs:   Recent Labs  12/11/13 1245  WBC 8.5  HGB 13.7  HCT 42.9  MCV 82.8  PLT 235     Recent Labs  12/12/13 0320 12/13/13 0328 12/14/13 0326  NA 139 137 143    K 5.3 5.0 5.1  CL 99 99 105  CO2 26 26 26   BUN 42* 30* 24*  CREATININE 1.78* 1.42* 1.32  CALCIUM 9.0 8.8 8.8     Recent Labs  12/11/13 1805 12/12/13 0013 12/12/13 0320  TROPONINI 0.56* 0.52* 0.50*     Lab Results  Component Value Date   TSH 10.200* 12/12/2013   Free T3 2.6    Recent Labs  12/11/13 1245  INR 1.27     Diagnostic Procedures and Studies:  Dg Chest Port 1 View  12/11/2013   IMPRESSION: 1. Cardiac enlargement. 2. No acute findings.   Electronically Signed   By: Kerby Moors M.D.   On: 12/11/2013 13:22    Disposition:   Pt is being discharged home today in good condition.  Follow-up Plans & Appointments      Follow-up Information   Follow up with Ida Rogue, MD In 1 week. (office will call to arrange appointment; you will need a lab test (BMET) this day)    Specialty:  Cardiology   Contact information:   [place] [address] [phone]       Follow up with Glori Bickers, MD On 12/20/2013. (keep appt on 5/22 as planned)    Specialty:  Cardiology   Contact information:   [address] [place] [address] [phone]       Discharge Medications    Medication List    STOP taking these medications       amiodarone 200 MG tablet  Commonly known as:  PACERONE     digoxin 0.25 MG tablet  Commonly known as:  LANOXIN     isosorbide mononitrate 30 MG 24 hr tablet  Commonly known as:  IMDUR     lisinopril 5 MG tablet  Commonly known as:  PRINIVIL,ZESTRIL     spironolactone 25 MG tablet  Commonly known as:  ALDACTONE      TAKE these medications       aspirin 81 MG tablet  Take 81 mg by mouth daily.     atorvastatin 80 MG tablet  Commonly known as:  LIPITOR  Take 80 mg by mouth daily.     carvedilol 3.125 MG tablet  Commonly known as:  COREG  Take 1 tablet (3.125 mg total) by mouth 2 (two) times daily with a meal.     Mesalamine 800 MG Tbec  Commonly known as:  ASACOL HD   Take 1 tablet (800 mg total) by mouth 3 (three) times daily.     niacin 1000 MG CR tablet  Commonly  known as:  NIASPAN  Take 1,000 mg by mouth at bedtime.     nitroGLYCERIN 0.4 MG SL tablet  Commonly known as:  NITROSTAT  Place 0.4 mg under the tongue every 5 (five) minutes as needed.     omeprazole 20 MG capsule  Commonly known as:  PRILOSEC  Take 20 mg by mouth daily.     rivaroxaban 20 MG Tabs tablet  Commonly known as:  XARELTO  Take 1 tablet (20 mg total) by mouth daily with lunch.         Outstanding Labs/Studies  1. BMET at follow appt next week. 2. Follow up TFTs with PCP in the next 3-4 weeks.  Duration of Discharge Encounter: Greater than 30 minutes including physician and PA time.  Signed, Richardson Dopp, PA-C   12/14/2013 11:02 AM

## 2013-12-14 NOTE — Progress Notes (Signed)
Patient ID: Colton Lamb, male   DOB: 1951/03/14, 63 y.o.   MRN: [id number]    Subjective:   No complaints   Objective:   Temp:  [97.5 F (36.4 C)-98.3 F (36.8 C)] 97.8 F (36.6 C) (05/16 0719) Pulse Rate:  [71-76] 74 (05/16 0719) Resp:  [18-20] 19 (05/16 0719) BP: (120-148)/(80-92) 120/80 mmHg (05/16 0719) SpO2:  [98 %-100 %] 99 % (05/16 0719) Weight:  [286 lb 9.6 oz (130 kg)] 286 lb 9.6 oz (130 kg) (05/16 0500) Last BM Date: 12/13/13  Filed Weights   12/12/13 0407 12/13/13 0348 12/14/13 0500  Weight: 292 lb 8 oz (132.677 kg) 290 lb 12.8 oz (131.906 kg) 286 lb 9.6 oz (130 kg)    Intake/Output Summary (Last 24 hours) at 12/14/13 0803 Last data filed at 12/14/13 0700  Gross per 24 hour  Intake    480 ml  Output   1301 ml  Net   -821 ml    Telemetry: NSR in 70s  Exam:  General:NAD  Resp: CTAB  Cardiac: RRR, no m/r/g, no JVD, no carotid bruits  GI: abdomen soft, NT, ND  MSK: no LE edema  Neuro: no focal deficits  Psych: appropriate affect  Lab Results:  Basic Metabolic Panel:  Recent Labs Lab 12/11/13 1245 12/12/13 0013 12/12/13 0320 12/13/13 0328 12/14/13 0326  NA 138 136* 139 137 143  K 5.6* 5.8* 5.3 5.0 5.1  CL 97 97 99 99 105  CO2 26 29 26 26 26   GLUCOSE 177* 134* 122* 125* 118*  BUN 42* 44* 42* 30* 24*  CREATININE 1.86* 1.88* 1.78* 1.42* 1.32  CALCIUM 9.2 8.9 9.0 8.8 8.8  MG  --  1.9  --   --   --     Liver Function Tests: No results found for this basename: AST, ALT, ALKPHOS, BILITOT, PROT, ALBUMIN,  in the last 168 hours  CBC:  Recent Labs Lab 12/11/13 1245  WBC 8.5  HGB 13.7  HCT 42.9  MCV 82.8  PLT 235    Cardiac Enzymes:  Recent Labs Lab 12/11/13 1805 12/12/13 0013 12/12/13 0320  TROPONINI 0.56* 0.52* 0.50*    BNP:  Recent Labs  11/28/13 0624 12/11/13 1245  PROBNP 1562.0* 5028.0*    Coagulation:  Recent Labs Lab 12/11/13 1245  INR 1.27    ECG:   Medications:   Scheduled Medications: .  aspirin EC  81 mg Oral Daily  . atorvastatin  80 mg Oral Daily  . carvedilol  3.125 mg Oral BID WC  . Mesalamine  800 mg Oral TID  . pantoprazole  40 mg Oral Daily  . rivaroxaban  20 mg Oral Q supper  . sodium chloride  3 mL Intravenous Q12H     Infusions:     PRN Medications:  sodium chloride, nitroGLYCERIN, sodium chloride     Assessment/Plan     63 yo male hx of PAF on xarelto, chronic systolic HF LVEF 20-23%, ICM with life vest, CAD with prior stenting, and aflutter with recent DCCV admitted with bradycardia and syncope.   Syncope and bradycardia- Suspect syncopal episode was due to mild volume depletion and junctional bradyarthria in the setting of dig toxicity. IVFs given overnight. Digoxin discontinued. BB, ACE, amio and spironolactone all on hold. Pt feeling much better. No further syncope/ near syncope. NSR on telemetry. HR in the 70s. BB stable. Will need to slowly add back HF meds.   CAD- recent NSTEMI (trop >20) in the setting of rapid atrial flutter.  LHC after DCCV which revealed an occluded LAD stent of unknown duration and moderate distal RCA occlusion. No PCI performed on 11/22/13. Continued medical management  -- Continue aspirin 81 and Xarelto for the time being. Plavix discontinued with initiation of Xarelto.  -- He has had no chest pain. Troponin continues to trend downward.   Atrial fibrillation- currently in NSR w/ HR in the 70s. Continue Xarelto. Continue current beta blocker dose, will not restart amio or digoxin this admission  Ischemic cardiomyopathy:  -- Stable. No active HF at this time. He is off BB, ACE and spironolactone. Will need to gradually add back slowly as HR and BP allows. This can be done as an outpatient Continue with LifeVest.   Chronic systolic HF: ICM, EF 37-04%, grade III DD - appears euvolemic  Junctional Bradycardia:  - due to dig toxicity. Digoxin was discontinued. BB and amio initially held. Low dose coreg restarted yesterday.  Will not restart amio  Acute renal failure, stage III;  -improving with hydration. Scr is 1.32(down from 1.88).  - ACE and aldactone can be started back as outpatient pending labs   Plan for discharge today, patient will need to follow up with Dr Rockey Situ in 1-2 weeks. Will not restart amio, digoxin, lisinopril, or aldactone at this point. Continue current dose of coreg, consider possible titration of meds at follow up if doing well.     Carlyle Dolly, M.D., F.A.C.C.

## 2013-12-17 NOTE — ED Provider Notes (Signed)
Medical screening examination/treatment/procedure(s) were performed by non-physician practitioner and as supervising physician I was immediately available for consultation/collaboration.   EKG Interpretation None       Virgel Manifold, MD 12/17/13 1623

## 2013-12-20 ENCOUNTER — Telehealth (HOSPITAL_COMMUNITY): Payer: Self-pay | Admitting: Anesthesiology

## 2013-12-20 ENCOUNTER — Ambulatory Visit (HOSPITAL_COMMUNITY)
Admission: RE | Admit: 2013-12-20 | Discharge: 2013-12-20 | Disposition: A | Discharge status: Home / Self Care | Payer: BC Managed Care – PPO | Source: Ambulatory Visit | Attending: Internal Medicine | Admitting: Internal Medicine

## 2013-12-20 ENCOUNTER — Encounter (HOSPITAL_COMMUNITY): Payer: Self-pay

## 2013-12-20 ENCOUNTER — Telehealth: Payer: Self-pay

## 2013-12-20 VITALS — BP 135/92 | HR 99 | Ht 71.0 in | Wt 298.8 lb

## 2013-12-20 DIAGNOSIS — K219 Gastro-esophageal reflux disease without esophagitis: Secondary | ICD-10-CM | POA: Insufficient documentation

## 2013-12-20 DIAGNOSIS — N179 Acute kidney failure, unspecified: Secondary | ICD-10-CM

## 2013-12-20 DIAGNOSIS — G4733 Obstructive sleep apnea (adult) (pediatric): Secondary | ICD-10-CM

## 2013-12-20 DIAGNOSIS — I5022 Chronic systolic (congestive) heart failure: Secondary | ICD-10-CM | POA: Insufficient documentation

## 2013-12-20 DIAGNOSIS — I4892 Unspecified atrial flutter: Secondary | ICD-10-CM | POA: Insufficient documentation

## 2013-12-20 DIAGNOSIS — I10 Essential (primary) hypertension: Secondary | ICD-10-CM | POA: Insufficient documentation

## 2013-12-20 DIAGNOSIS — I4891 Unspecified atrial fibrillation: Secondary | ICD-10-CM

## 2013-12-20 DIAGNOSIS — I2589 Other forms of chronic ischemic heart disease: Secondary | ICD-10-CM | POA: Insufficient documentation

## 2013-12-20 DIAGNOSIS — K5289 Other specified noninfective gastroenteritis and colitis: Secondary | ICD-10-CM | POA: Insufficient documentation

## 2013-12-20 DIAGNOSIS — I252 Old myocardial infarction: Secondary | ICD-10-CM | POA: Insufficient documentation

## 2013-12-20 DIAGNOSIS — E669 Obesity, unspecified: Secondary | ICD-10-CM | POA: Insufficient documentation

## 2013-12-20 DIAGNOSIS — I2510 Atherosclerotic heart disease of native coronary artery without angina pectoris: Secondary | ICD-10-CM | POA: Insufficient documentation

## 2013-12-20 DIAGNOSIS — Z9861 Coronary angioplasty status: Secondary | ICD-10-CM | POA: Insufficient documentation

## 2013-12-20 LAB — BASIC METABOLIC PANEL
BUN: 14 mg/dL (ref 6–23)
CALCIUM: 8.8 mg/dL (ref 8.4–10.5)
CO2: 24 mEq/L (ref 19–32)
CREATININE: 1.07 mg/dL (ref 0.50–1.35)
Chloride: 99 mEq/L (ref 96–112)
GFR calc Af Amer: 84 mL/min — ABNORMAL LOW (ref >90)
GFR, EST NON AFRICAN AMERICAN: 72 mL/min — AB (ref >90)
GLUCOSE: 224 mg/dL — AB (ref 70–99)
Potassium: 4.4 mEq/L (ref 3.7–5.3)
Sodium: 135 mEq/L — ABNORMAL LOW (ref 137–147)

## 2013-12-20 MED ORDER — CARVEDILOL 6.25 MG PO TABS: 6.2500 mg | ORAL_TABLET | Freq: Two times a day (BID) | ORAL | Status: DC

## 2013-12-20 MED ORDER — FUROSEMIDE 40 MG PO TABS
40.0000 mg | ORAL_TABLET | Freq: Every day | ORAL | Status: DC
Start: 2013-12-20 — End: 2014-02-26

## 2013-12-20 NOTE — Patient Instructions (Signed)
Increase your coreg to 6.25 mg (2 tablets) in the morning and 6.25 mg (2 tablets) in the evening. Your new prescription will be 6.25 mg tablets so when you pick it up then you will take 1 tablet in the morning and 1 tablet in the evening.  Call if you have any dizziness or fatigue  F/U 3 weeks with ECHO.  Do the following things EVERYDAY: 1) Weigh yourself in the morning before breakfast. Write it down and keep it in a log. 2) Take your medicines as prescribed 3) Eat low salt foods-Limit salt (sodium) to 2000 mg per day.  4) Stay as active as you can everyday 5) Limit all fluids for the day to less than 2 liters 6)

## 2013-12-20 NOTE — Progress Notes (Signed)
Patient ID: Colton Lamb, male   DOB: July 25, 1951, 64 y.o.   MRN: [id number]  PCP: Dr. Harle [facility]) Primary Cardiologist: Dr. Rockey Situ  HPI:  Colton Lamb is a 63 year old gentleman with a history of paroxysmal atrial fibrillation, last episode in 2008 per the patient, hyperlipidemia and coronary artery disease s/p PCI x3 to the LAD, circumflex, PDA and PL branch (in stent restenosis on little RCA and tx with cyper stent to OM).   Presented to the hospital with NSTEMI 4/24- 5/3/15He was found to be in A-flutter and was started on cardizem and heparin gtt. Pertinent labs on admission Troponin 17, Hgb A1C 7.9, K+ 4.5 and Cr 1.16. Underwent DC-CV which was successful. Diuresed with IV lasix and discharge weight 289 lbs. He has some NSVT in the hospital and LIfeVest was placed.   LHC/ LEFT VENTRICULOGRAM  -Aortic pressure 120/93, LV pressur 126/44, LVEDP 46  -L main widely patent  -LAD totally occluded promially within previously placed stent  -LCx widely patent  -RCA co-dominant; distal RCA leading into PDA contains segmental 70% stenosis  - Dilated LV and and severe dysfunction, estimate LVEF 20% or less, possible LV thrombus   ECHO (10/2013): EF 20-25%, diff HK, grade III DD, RV mildly dilated and sys fx mild/mod reduced.  [facility] F/U: After last visit his Cr was elevated patient was instructed to stop lasix and spironolactone and then get repeat blood work. Repeat blood work showed creatinine improving, however digoxin was 2.2. Before labs could be addressed he was admitted to the hospital for syncope and HR was 30. In ED given atropine and meds were discontinued spiro, amiodarone, digoxin and ACE-I. Discharge weight was 286 lbs. Doing great. Denies SOB, orthopnea, PND, or CP. Weight at home 286-287 lbs. Taking medications as prescribed. Able to go about 1 block before SOB. Following a low salt diet and drinking less than 2L a day. No dizziness.   ROS: All systems negative  except as listed in HPI, PMH and Problem List.  Past Medical History  Diagnosis Date  . Crohn's ileocolitis   . GERD (gastroesophageal reflux disease)   . Hx of adenomatous colonic polyps 11/2003  . Coronary artery disease     s/p stent placement  . MI (myocardial infarction) 2007  . Hypertension   . Obesity   . Atrial fibrillation   . Chronic systolic heart failure     a) EF 20-25%, grade III DD, RV mildly dilated and sys fx mild/mod reduced (10/2013)  . Ischemic cardiomyopathy     a) LHC (10/2013): LAD stent occlusion, duration unknown. QS pattern across precordium on EKG suggest not acute, moderate distal RCA obstruction and widely patent circumflex coronary artery  . Atrial flutter     a) s/p Cardioversion 11/22/13, on amiodarone and Xarelto  . Dysrhythmia     ATRIAL FIBRILATION  . Syncope 11/2013    in setting of volume depletion and bradycardia due to dig toxicity     Current Outpatient Prescriptions  Medication Sig Dispense Refill  . aspirin 81 MG tablet Take 81 mg by mouth daily.        Marland Kitchen atorvastatin (LIPITOR) 80 MG tablet Take 80 mg by mouth daily.      . carvedilol (COREG) 3.125 MG tablet Take 1 tablet (3.125 mg total) by mouth 2 (two) times daily with a meal.  60 tablet  5  . Mesalamine (ASACOL HD) 800 MG TBEC Take 1 tablet (800 mg total) by mouth 3 (three) times daily.  90 tablet  11  . niacin (NIASPAN) 1000 MG CR tablet Take 1,000 mg by mouth at bedtime.      . nitroGLYCERIN (NITROSTAT) 0.4 MG SL tablet Place 0.4 mg under the tongue every 5 (five) minutes as needed.        Marland Kitchen omeprazole (PRILOSEC) 20 MG capsule Take 20 mg by mouth daily.        . rivaroxaban (XARELTO) 20 MG TABS tablet Take 1 tablet (20 mg total) by mouth daily with lunch.  30 tablet  11   No current facility-administered medications for this encounter.    Filed Vitals:   12/20/13 0851  BP: 135/92  Pulse: 99  Height: 5' 11"  (1.803 m)  Weight: 298 lb 12.8 oz (135.535 kg)  SpO2: 100%   PHYSICAL  EXAM: General: Well appearing. No resp difficulty  HEENT: normal  Neck: supple. JVP difficult to assess d/t body habitus but appears 9; Carotids 2+ bilat; no bruits. No lymphadenopathy or thryomegaly appreciated.  Cor: PMI nondisplaced. Distant heart sounds. Regular rate & rhythm. No rubs, gallops or murmurs. LifeVest intact Lungs: clear  Abdomen: soft, nontender, non distended. No hepatosplenomegaly. No bruits or masses. Good bowel sounds.  Extremities: no cyanosis, clubbing, rash, no edema Neuro: alert & orientedx3, cranial nerves grossly intact. moves all 4 extremities w/o difficulty. Affect pleasant  EKG: NSR 87 bpm  ASSESSMENT & PLAN:  1) Chronic systolic HF: ICM, EF 96-28%, grade III DD, RV mildly dilated and sys fx mild/mod reduced (10/2013) - Reviewed discharge summary and patient recently admitted for digoxin toxicity. Amiodarone, ACE-I, digoxin and Spiro discontinued. . - NYHA II symptoms and volume status elevated. Will check BMET and restart lasix 40 mg daily (previously on 40 mg BID).  - Will increase coreg to 6.25 mg BID and told to call if any dizziness.  - No ACE-I or Sprio with recent AKI and hyperkalemia, will continue to follow.  - Will need repeat ECHO next visit to assess EF. If remains less than 35% will refer to EP for ICD for primary prevention. QRS narrow, no CRT-D. Continue LifeVest  - Reinforced the need and importance of daily weights, a low sodium diet, and fluid restriction (less than 2 L a day). Instructed to call the HF clinic if weight increases more than 3 lbs overnight or 5 lbs in a week.  2) HTN - stable. Continue current regimen. Increase BB for LV dysfunction.  3) CAD - occluded LAD stent unknown duration. No PCI performed on 11/22/13. Continue medical management  - Continue aspirin 81 and statin.  4) ?OSA - Reports that he used CPAP in the past. Has sleep study set up for June.  5) Obesity - Discussed with patient watching portion control and trying  to be as active as possible.  6) Atrial flutter - s/p s/p Cardioversion 11/22/13. In NSR. Will continue Xarelto.   F/U 3 weeks with ECHO.  Colton Brunt NP-C 9:10 AM

## 2013-12-20 NOTE — Telephone Encounter (Signed)
Called patient about lab work. Cr stable 1.07 and K+ 4.4. Glucose elevated and told to follow up with PCP. Will restart lasix 40 mg daily which patient is aware of.   Rande Brunt NP-C 12:40 PM

## 2013-12-20 NOTE — Telephone Encounter (Signed)
Pt called and states he was seen today at the HF clinic in [place], they wanted him to ask if he needs to keep the appt with dr Rockey Situ on Wednesday. Please call and advise.

## 2013-12-20 NOTE — Telephone Encounter (Signed)
Left message on pt's voicemail that he would need to keep appt w/ Dr. Rockey Situ in order to have any meds refilled, as pt has not been seen in our office in over a year.

## 2013-12-21 NOTE — Addendum Note (Signed)
Encounter addended by: Vanessa Barbara, CCT on: 12/21/2013 12:19 PM<BR>     Documentation filed: Charges VN

## 2013-12-25 ENCOUNTER — Encounter: Payer: Self-pay | Admitting: Cardiovascular Disease

## 2013-12-25 ENCOUNTER — Ambulatory Visit (INDEPENDENT_AMBULATORY_CARE_PROVIDER_SITE_OTHER): Payer: BC Managed Care – PPO | Admitting: Cardiovascular Disease

## 2013-12-25 VITALS — BP 110/80 | HR 87 | Ht 71.0 in | Wt 294.8 lb

## 2013-12-25 DIAGNOSIS — I2589 Other forms of chronic ischemic heart disease: Secondary | ICD-10-CM

## 2013-12-25 DIAGNOSIS — I5022 Chronic systolic (congestive) heart failure: Secondary | ICD-10-CM

## 2013-12-25 DIAGNOSIS — E669 Obesity, unspecified: Secondary | ICD-10-CM

## 2013-12-25 DIAGNOSIS — I255 Ischemic cardiomyopathy: Secondary | ICD-10-CM

## 2013-12-25 DIAGNOSIS — I4892 Unspecified atrial flutter: Secondary | ICD-10-CM

## 2013-12-25 DIAGNOSIS — G4733 Obstructive sleep apnea (adult) (pediatric): Secondary | ICD-10-CM

## 2013-12-25 DIAGNOSIS — I2510 Atherosclerotic heart disease of native coronary artery without angina pectoris: Secondary | ICD-10-CM

## 2013-12-25 DIAGNOSIS — Z9861 Coronary angioplasty status: Secondary | ICD-10-CM

## 2013-12-25 DIAGNOSIS — E785 Hyperlipidemia, unspecified: Secondary | ICD-10-CM

## 2013-12-25 NOTE — Assessment & Plan Note (Signed)
Maintaining normal sinus rhythm. Recommended he stay on his anticoagulation, carvedilol. Could consider ablation for recurrent flutter

## 2013-12-25 NOTE — Assessment & Plan Note (Addendum)
Recent cardiac catheterization showing occluded LAD. Ejection fraction severely depressed, possibly out of proportion to his underlying CAD. Like best in place. We'll start  lisinopril 5 mg daily. this could be titrated up slowly, with restarting of his isosorbide over the next several weeks . Suspect he will need ICD

## 2013-12-25 NOTE — Assessment & Plan Note (Signed)
Cholesterol is at goal on the current lipid regimen. No changes to the medications were made.  

## 2013-12-25 NOTE — Assessment & Plan Note (Signed)
No significant edema on today's visit. Appears relatively euvolemic. No complaints overall. Suggested he closely monitor his weight at home

## 2013-12-25 NOTE — Progress Notes (Signed)
Patient ID: Colton Lamb, male    DOB: 06-21-1951, 63 y.o.   MRN: [id number]  HPI Comments: Colton Lamb is a 63 year old gentleman with a history of paroxysmal atrial fibrillation,  hyperlipidemia, coronary artery disease, PCI x3 to the LAD, circumflex, PDA and PL branch, ejection fraction 45-50% with anterior hypokinesis at that time,  catheterization in 2008  with a cypher stent to the OM, sleep apnea, has not been using CPAP.  In followup today, he has had 2 recent hospital admissions. Admitted in April 2015 with atrial flutter, non-ST elevation MI, with cardioversion, cardiac catheterization showing occluded LAD. Medical management recommended. Severely depressed ejection fraction noted he had 100% LAD stenosis, moderate distal RCA disease, patent left circumflex. LifeVest was placed at that time. Ejection fraction 20-25%  Repeat hospital admission May 13 with discharge 12/14/2013 with dig toxicity, junctional rhythm, bradycardia with heart rates in the 30s, syncope. Acute renal failure likely secondary to ATN. Also noted to have TSH of 10. ACE inhibitor, spironolactone, isosorbide was held. Renal function improved at the time of discharge  In followup today, he reports that he feels well overall. No lightheadedness or dizziness, no significant lower extremity edema. Reports that he has echocardiogram in followup in heart failure clinic in 2 weeks' time in [place]   EKG shows normal sinus rhythm with rate 87 beats per minute, old anterior infarct, left axis deviation   Outpatient Encounter Prescriptions as of 12/25/2013  Medication Sig  . aspirin 81 MG tablet Take 81 mg by mouth daily.    Marland Kitchen atorvastatin (LIPITOR) 80 MG tablet Take 80 mg by mouth daily.  . carvedilol (COREG) 6.25 MG tablet Take 1 tablet (6.25 mg total) by mouth 2 (two) times daily with a meal.  . furosemide (LASIX) 40 MG tablet Take 1 tablet (40 mg total) by mouth daily.  . Mesalamine (ASACOL HD) 800 MG TBEC Take 1  tablet (800 mg total) by mouth 3 (three) times daily.  . niacin (NIASPAN) 1000 MG CR tablet Take 1,000 mg by mouth at bedtime.  . nitroGLYCERIN (NITROSTAT) 0.4 MG SL tablet Place 0.4 mg under the tongue every 5 (five) minutes as needed.    Marland Kitchen omeprazole (PRILOSEC) 20 MG capsule Take 20 mg by mouth daily.    . rivaroxaban (XARELTO) 20 MG TABS tablet Take 1 tablet (20 mg total) by mouth daily with lunch.    Review of Systems  Constitutional: Negative.   HENT: Negative.   Eyes: Negative.   Respiratory: Negative.   Cardiovascular: Negative.   Gastrointestinal: Negative.   Endocrine: Negative.   Musculoskeletal: Negative.   Skin: Negative.   Allergic/Immunologic: Negative.   Neurological: Negative.   Hematological: Negative.   Psychiatric/Behavioral: Negative.   All other systems reviewed and are negative.   BP 110/80  Pulse 87  Ht 5' 11"  (1.803 m)  Wt 294 lb 12 oz (133.698 kg)  BMI 41.13 kg/m2  Physical Exam  Nursing note and vitals reviewed. Constitutional: He is oriented to person, place, and time. He appears well-developed and well-nourished.  Obese, LifeVest in place  HENT:  Head: Normocephalic.  Nose: Nose normal.  Mouth/Throat: Oropharynx is clear and moist.  Eyes: Conjunctivae are normal. Pupils are equal, round, and reactive to light.  Neck: Normal range of motion. Neck supple. No JVD present.  Cardiovascular: Normal rate, regular rhythm, S1 normal, S2 normal, normal heart sounds and intact distal pulses.  Exam reveals no gallop and no friction rub.   No murmur heard. Pulmonary/Chest: Effort  normal and breath sounds normal. No respiratory distress. He has no wheezes. He has no rales. He exhibits no tenderness.  Abdominal: Soft. Bowel sounds are normal. He exhibits no distension. There is no tenderness.  Musculoskeletal: Normal range of motion. He exhibits no edema and no tenderness.  Lymphadenopathy:    He has no cervical adenopathy.  Neurological: He is alert and  oriented to person, place, and time. Coordination normal.  Skin: Skin is warm and dry. No rash noted. No erythema.  Psychiatric: He has a normal mood and affect. His behavior is normal. Judgment and thought content normal.      Assessment and Plan

## 2013-12-25 NOTE — Patient Instructions (Addendum)
You are doing well. Please restart lisinopril 5 mg daily  Please call us if you have new issues that need to be addressed before your next appt.  Your physician wants you to follow-up in: 2 months.

## 2013-12-25 NOTE — Assessment & Plan Note (Signed)
We have encouraged continued exercise, careful diet management in an effort to lose weight. 

## 2013-12-25 NOTE — Assessment & Plan Note (Signed)
In the past has been noncompliant with his CPAP.

## 2014-01-05 ENCOUNTER — Encounter (HOSPITAL_BASED_OUTPATIENT_CLINIC_OR_DEPARTMENT_OTHER): Payer: BC Managed Care – PPO

## 2014-01-10 ENCOUNTER — Telehealth: Payer: Self-pay | Admitting: Gastroenterology

## 2014-01-10 DIAGNOSIS — K5080 Crohn's disease of both small and large intestine without complications: Secondary | ICD-10-CM

## 2014-01-10 MED ORDER — MESALAMINE 800 MG PO TBEC: 1.0000 | DELAYED_RELEASE_TABLET | Freq: Three times a day (TID) | ORAL | Status: DC

## 2014-01-10 NOTE — Telephone Encounter (Signed)
Told patient he is over due for an office visit but we will send one refill to patient's pharmacy if he schedules an office visit. Pt agreed and scheduled for 01/22/14 at 9:15am.

## 2014-01-10 NOTE — Telephone Encounter (Signed)
Left a message for patient to return my call. 

## 2014-01-13 ENCOUNTER — Encounter (HOSPITAL_COMMUNITY): Payer: BC Managed Care – PPO

## 2014-01-13 ENCOUNTER — Ambulatory Visit (HOSPITAL_COMMUNITY): Payer: BC Managed Care – PPO

## 2014-01-14 ENCOUNTER — Telehealth: Payer: Self-pay | Admitting: *Deleted

## 2014-01-14 DIAGNOSIS — K5080 Crohn's disease of both small and large intestine without complications: Secondary | ICD-10-CM

## 2014-01-14 MED ORDER — MESALAMINE 800 MG PO TBEC: 1.0000 | DELAYED_RELEASE_TABLET | Freq: Three times a day (TID) | ORAL | Status: DC

## 2014-01-14 NOTE — Telephone Encounter (Signed)
Patient's insurance will not cover the Asacol HD sent to the pharmacy (even after prior auth was attempted). They will cover balsalazide, sulfasalazine, sulfasalazine del-rel, Apriso, Lialda, Pentasa and Uceris. In order for patient to be approved for Asacol HD, he must had tried and failed "3 or more in a class with at least 3 alternatives, 2 in a class with 2 alternatives or 1 in a class with only 1 alternative." I have spoken to patient to advise him of this. Since he has an appointment with Dr Fuller Plan on 01/22/14, I have advised that I will put samples of the Asascol HD at the front desk for him to pick up. We will discuss alternative medications when he comes for his visit on 01/22/14. Patient verbalizes understanding.

## 2014-01-22 ENCOUNTER — Ambulatory Visit (INDEPENDENT_AMBULATORY_CARE_PROVIDER_SITE_OTHER): Payer: BC Managed Care – PPO | Admitting: Gastroenterology

## 2014-01-22 ENCOUNTER — Encounter: Payer: Self-pay | Admitting: Gastroenterology

## 2014-01-22 VITALS — BP 120/80 | HR 88 | Ht 71.0 in | Wt 302.0 lb

## 2014-01-22 DIAGNOSIS — Z7901 Long term (current) use of anticoagulants: Secondary | ICD-10-CM

## 2014-01-22 DIAGNOSIS — K219 Gastro-esophageal reflux disease without esophagitis: Secondary | ICD-10-CM

## 2014-01-22 DIAGNOSIS — K5080 Crohn's disease of both small and large intestine without complications: Secondary | ICD-10-CM

## 2014-01-22 DIAGNOSIS — Z86010 Personal history of colonic polyps: Secondary | ICD-10-CM

## 2014-01-22 MED ORDER — BALSALAZIDE DISODIUM 750 MG PO CAPS: 750.0000 mg | ORAL_CAPSULE | Freq: Three times a day (TID) | ORAL | Status: DC

## 2014-01-22 NOTE — Patient Instructions (Signed)
We have sent the following medications to your pharmacy for you to pick up at your convenience: [place] will be due for a recall colonoscopy in 05/2014. We will send you a reminder in the mail when it gets closer to that time.  Thank you for choosing me and [facility].  [name]. Dagoberto Ligas., MD., Marval Regal

## 2014-01-22 NOTE — Progress Notes (Signed)
    History of Present Illness: This is a 63 year old male with Crohn's ileocolitis. He has not had any active symptoms. He relates that his insurance provider no longer covering Asacol. I reviewed his extensive cardiac history over the past several months. His cardiac situation is stable however he prefers to wait a few months for proceeding with surveillance colonoscopy.  Current Medications, Allergies, Past Medical History, Past Surgical History, Family History and Social History were reviewed in [employer] record.  Physical Exam: General: Well developed , well nourished, no acute distress Head: Normocephalic and atraumatic Eyes:  sclerae anicteric, EOMI Ears: Normal auditory acuity Mouth: No deformity or lesions Lungs: Clear throughout to auscultation Heart: Regular rate and rhythm; no murmurs, rubs or bruits Abdomen: Soft, non tender and non distended. No masses, hepatosplenomegaly or hernias noted. Normal Bowel sounds Musculoskeletal: Symmetrical with no gross deformities  Pulses:  Normal pulses noted Extremities: No clubbing, cyanosis, edema or deformities noted Neurological: Alert oriented x 4, grossly nonfocal Psychological:  Alert and cooperative. Normal mood and affect  Assessment and Recommendations:  1. Crohn's ileocolitis. Stable. Change to balsalazide 750 mg 3 times a day. Obtain CBC, CMET and B12.   2. Personal history of adenomatous colon polyps. Surveillance colonoscopy in October 2015.   3. GERD. Continue standard antireflux measures and omeprazole 20 mg daily.  4. Chronic anticoagulation for afib.

## 2014-01-29 ENCOUNTER — Ambulatory Visit (HOSPITAL_COMMUNITY)
Admission: RE | Admit: 2014-01-29 | Discharge: 2014-01-29 | Disposition: A | Discharge status: Home / Self Care | Payer: BC Managed Care – PPO | Source: Ambulatory Visit | Attending: Internal Medicine | Admitting: Internal Medicine

## 2014-01-29 ENCOUNTER — Ambulatory Visit (HOSPITAL_BASED_OUTPATIENT_CLINIC_OR_DEPARTMENT_OTHER)
Admission: RE | Admit: 2014-01-29 | Discharge: 2014-01-29 | Disposition: A | Discharge status: Home / Self Care | Payer: BC Managed Care – PPO | Source: Ambulatory Visit | Attending: Internal Medicine | Admitting: Internal Medicine

## 2014-01-29 VITALS — BP 112/70 | HR 84 | Wt 298.8 lb

## 2014-01-29 DIAGNOSIS — I5022 Chronic systolic (congestive) heart failure: Secondary | ICD-10-CM | POA: Insufficient documentation

## 2014-01-29 DIAGNOSIS — I2510 Atherosclerotic heart disease of native coronary artery without angina pectoris: Secondary | ICD-10-CM

## 2014-01-29 DIAGNOSIS — K219 Gastro-esophageal reflux disease without esophagitis: Secondary | ICD-10-CM | POA: Insufficient documentation

## 2014-01-29 DIAGNOSIS — Z9861 Coronary angioplasty status: Secondary | ICD-10-CM

## 2014-01-29 DIAGNOSIS — I422 Other hypertrophic cardiomyopathy: Secondary | ICD-10-CM | POA: Insufficient documentation

## 2014-01-29 DIAGNOSIS — I517 Cardiomegaly: Secondary | ICD-10-CM | POA: Insufficient documentation

## 2014-01-29 DIAGNOSIS — I252 Old myocardial infarction: Secondary | ICD-10-CM | POA: Insufficient documentation

## 2014-01-29 DIAGNOSIS — I4891 Unspecified atrial fibrillation: Secondary | ICD-10-CM | POA: Insufficient documentation

## 2014-01-29 DIAGNOSIS — G4733 Obstructive sleep apnea (adult) (pediatric): Secondary | ICD-10-CM | POA: Insufficient documentation

## 2014-01-29 DIAGNOSIS — E669 Obesity, unspecified: Secondary | ICD-10-CM | POA: Insufficient documentation

## 2014-01-29 DIAGNOSIS — E785 Hyperlipidemia, unspecified: Secondary | ICD-10-CM | POA: Insufficient documentation

## 2014-01-29 DIAGNOSIS — R609 Edema, unspecified: Secondary | ICD-10-CM | POA: Insufficient documentation

## 2014-01-29 DIAGNOSIS — I4892 Unspecified atrial flutter: Secondary | ICD-10-CM

## 2014-01-29 DIAGNOSIS — I483 Typical atrial flutter: Secondary | ICD-10-CM

## 2014-01-29 DIAGNOSIS — I509 Heart failure, unspecified: Secondary | ICD-10-CM | POA: Insufficient documentation

## 2014-01-29 DIAGNOSIS — K5090 Crohn's disease, unspecified, without complications: Secondary | ICD-10-CM | POA: Insufficient documentation

## 2014-01-29 LAB — BASIC METABOLIC PANEL
ANION GAP: 13 (ref 5–15)
BUN: 15 mg/dL (ref 6–23)
CHLORIDE: 99 meq/L (ref 96–112)
CO2: 26 mEq/L (ref 19–32)
Calcium: 8.9 mg/dL (ref 8.4–10.5)
Creatinine, Ser: 0.94 mg/dL (ref 0.50–1.35)
GFR, EST NON AFRICAN AMERICAN: 88 mL/min — AB (ref >90)
Glucose, Bld: 201 mg/dL — ABNORMAL HIGH (ref 70–99)
POTASSIUM: 4.1 meq/L (ref 3.7–5.3)
SODIUM: 138 meq/L (ref 137–147)

## 2014-01-29 MED ORDER — PERFLUTREN LIPID MICROSPHERE
INTRAVENOUS | Status: AC
  Filled 2014-01-29: qty 10

## 2014-01-29 NOTE — Patient Instructions (Signed)
Lab today  You have been referred to Dr Caryl Comes to discuss getting a defibrillator  Your physician recommends that you schedule a follow-up appointment in: 4 weeks

## 2014-01-29 NOTE — Progress Notes (Signed)
  Echocardiogram 2D Echocardiogram with Definity has been performed.  [place], [place] 01/29/2014, 1:58 PM

## 2014-01-29 NOTE — Progress Notes (Signed)
Patient ID: YEHYA BRENDLE, male   DOB: 20-Feb-1951, 63 y.o.   MRN: [id number]  PCP: Dr. Harle Stanford [facility]) Primary Cardiologist: Dr. Rockey Situ  HPI:  Mr. Bingman is a 63 year old gentleman with a history of paroxysmal atrial fibrillation, (last episode 2008) hyperlipidemia, coronary artery disease and chronic systolic HF EF 61-44%.  Presented to the hospital with NSTEMI 4/24- 12/01/13 He was found to be in A-flutter and was started on cardizem and heparin gtt. Pertinent labs on admission Troponin 17, Hgb A1C 7.9, K+ 4.5 and Cr 1.16. Underwent cath (results below) and DC-CV which was successful. Diuresed with IV lasix and discharge weight 289 lbs. He has some NSVT in the hospital and LIfeVest was placed. Treated medically as LAD occlusion thought to be chronic with diffuse Qs across precordium   LHC/ LEFT VENTRICULOGRAM  -Aortic pressure 120/93, LV pressur 126/44, LVEDP 46  -L main widely patent  -LAD totally occluded promially within previously placed stent  -LCx widely patent  -RCA co-dominant; distal RCA leading into PDA contains segmental 70% stenosis  - Dilated LV and and severe dysfunction, estimate LVEF 20% or less, possible LV thrombus   Repeat hospital admission May 13 with discharge 12/14/2013 with dig toxicity (level 2.2), junctional rhythm, bradycardia with heart rates in the 30s, syncope. Acute renal failure likely secondary to ATN. Also noted to have TSH of 10. ACE inhibitor, amio, spironolactone, isosorbide was held. Renal function improved at the time of discharge  ECHO (10/2013): EF 20-25%, diff HK, grade III DD, RV mildly dilated and sys fx mild/mod reduced. ECHO 01/29/2014: Poor windows. EF ~25% RV mild to mod reduced  [facility] F/U: Returns for routine f/u. Lasix restarted at last visit. Lisinopril has been restarted by Dr. Rockey Situ. Feels very good. Able to do all ADLs without problem. Goes to grocery store without any problem. Doesn't have to stop. No dizziness, edema,  orthopnea, PND. BP good. Weighing every day. No palpitations. No firing of LifeVest. No breathing on Xarelto. Weighs himself every morning. Weight stable at 296.   ROS: All systems negative except as listed in HPI, PMH and Problem List.  Past Medical History  Diagnosis Date  . Crohn's ileocolitis   . GERD (gastroesophageal reflux disease)   . Hx of adenomatous colonic polyps 11/2003  . Coronary artery disease     s/p stent placement  . MI (myocardial infarction) 2007  . Hypertension   . Obesity   . Atrial fibrillation   . Chronic systolic heart failure     a) EF 20-25%, grade III DD, RV mildly dilated and sys fx mild/mod reduced (10/2013)  . Ischemic cardiomyopathy     a) LHC (10/2013): LAD stent occlusion, duration unknown. QS pattern across precordium on EKG suggest not acute, moderate distal RCA obstruction and widely patent circumflex coronary artery  . Atrial flutter     a) s/p Cardioversion 11/22/13, on amiodarone and Xarelto  . Dysrhythmia     ATRIAL FIBRILATION  . Syncope 11/2013    in setting of volume depletion and bradycardia due to dig toxicity   . CHF (congestive heart failure)     Current Outpatient Prescriptions  Medication Sig Dispense Refill  . aspirin 81 MG tablet Take 81 mg by mouth daily.        Marland Kitchen atorvastatin (LIPITOR) 80 MG tablet Take 80 mg by mouth daily.      . balsalazide (COLAZAL) 750 MG capsule Take 1 capsule (750 mg total) by mouth 3 (three) times daily.  [place]  capsule  11  . carvedilol (COREG) 6.25 MG tablet Take 1 tablet (6.25 mg total) by mouth 2 (two) times daily with a meal.  60 tablet  5  . furosemide (LASIX) 40 MG tablet Take 1 tablet (40 mg total) by mouth daily.  30 tablet  3  . lisinopril (PRINIVIL,ZESTRIL) 5 MG tablet Take 1 tablet (5 mg total) by mouth daily.  30 tablet  6  . niacin (NIASPAN) 1000 MG CR tablet Take 1,000 mg by mouth at bedtime.      . nitroGLYCERIN (NITROSTAT) 0.4 MG SL tablet Place 0.4 mg under the tongue every 5 (five) minutes  as needed.        Marland Kitchen omeprazole (PRILOSEC) 20 MG capsule Take 20 mg by mouth daily.        . rivaroxaban (XARELTO) 20 MG TABS tablet Take 1 tablet (20 mg total) by mouth daily with lunch.  30 tablet  11   No current facility-administered medications for this encounter.    Filed Vitals:   01/29/14 1358  BP: 112/70  Pulse: 84  Weight: 298 lb 12 oz (135.512 kg)  SpO2: 97%   PHYSICAL EXAM: General: Well appearing. No resp difficulty  HEENT: normal  Neck: supple. JVP difficult to assess d/t body habitus but appears ok; Carotids 2+ bilat; no bruits. No lymphadenopathy or thryomegaly appreciated.  Cor: PMI nondisplaced. Distant heart sounds. Regular rate & rhythm. No rubs, gallops or murmurs. LifeVest intact Lungs: clear  Abdomen: soft, nontender, non distended. No hepatosplenomegaly. No bruits or masses. Good bowel sounds.  Extremities: no cyanosis, clubbing, rash, 1+ ankle edema Neuro: alert & orientedx3, cranial nerves grossly intact. moves all 4 extremities w/o difficulty. Affect pleasant   ASSESSMENT & PLAN:  1) Chronic systolic HF: ICM, EF 50-09%, grade III DD, RV mildly dilated and sys fx mild/mod reduced (10/2013). Echo today with contrast limited images but EF appears 20-25% - Doing well - NYHA II symptoms and volume status looks good.  - Will increase coreg to 9.375 mg BID - EF remains less than 35% will refer to EP for ICD for primary prevention. QRS narrow, no CRT-D. Continue LifeVest until then - Reinforced the need and importance of daily weights, a low sodium diet, and fluid restriction (less than 2 L a day). Instructed to call the HF clinic if weight increases more than 3 lbs overnight or 5 lbs in a week.  - Return to clinic in 3-4 weeks for continued med titration - Check BMET today. Would like to get spiro back on board.  2) HTN - stable. Continue current regimen. Increase BB for LV dysfunction.  3) CAD - occluded LAD stent unknown duration. No PCI performed on 11/22/13.  Continue medical management  - Continue aspirin 81 and statin. May be able to d/c statin soon given Xarelto but would await at least 6 months after NSTEM 4) ?OSA - Pending sleep study July 14 5) Obesity - Discussed with patient watching portion control and trying to be as active as possible.  6) Atrial flutter - s/p s/p Cardioversion 11/22/13. In NSR. Will continue Xarelto. Is off amio  Glori Bickers MD  2:28 PM

## 2014-01-29 NOTE — Addendum Note (Signed)
Encounter addended by: Scarlette Calico, RN on: 01/29/2014  2:47 PM<BR>     Documentation filed: Patient Instructions Section, Orders

## 2014-01-30 MED FILL — Perflutren Lipid Microsphere IV Susp 1.1 MG/ML: INTRAVENOUS | Qty: 10 | Status: AC

## 2014-02-11 ENCOUNTER — Encounter (HOSPITAL_BASED_OUTPATIENT_CLINIC_OR_DEPARTMENT_OTHER): Payer: BC Managed Care – PPO

## 2014-02-19 ENCOUNTER — Other Ambulatory Visit: Payer: Self-pay | Admitting: Cardiovascular Disease

## 2014-02-26 ENCOUNTER — Encounter: Payer: Self-pay | Admitting: Cardiovascular Disease

## 2014-02-26 ENCOUNTER — Ambulatory Visit (INDEPENDENT_AMBULATORY_CARE_PROVIDER_SITE_OTHER): Payer: BC Managed Care – PPO | Admitting: Cardiovascular Disease

## 2014-02-26 ENCOUNTER — Telehealth: Payer: Self-pay

## 2014-02-26 VITALS — BP 132/92 | HR 100 | Ht 71.0 in | Wt 305.5 lb

## 2014-02-26 DIAGNOSIS — I4891 Unspecified atrial fibrillation: Secondary | ICD-10-CM

## 2014-02-26 DIAGNOSIS — Z9861 Coronary angioplasty status: Secondary | ICD-10-CM

## 2014-02-26 DIAGNOSIS — I5041 Acute combined systolic (congestive) and diastolic (congestive) heart failure: Secondary | ICD-10-CM

## 2014-02-26 DIAGNOSIS — I2510 Atherosclerotic heart disease of native coronary artery without angina pectoris: Secondary | ICD-10-CM

## 2014-02-26 DIAGNOSIS — I509 Heart failure, unspecified: Secondary | ICD-10-CM

## 2014-02-26 DIAGNOSIS — I255 Ischemic cardiomyopathy: Secondary | ICD-10-CM

## 2014-02-26 DIAGNOSIS — E785 Hyperlipidemia, unspecified: Secondary | ICD-10-CM

## 2014-02-26 DIAGNOSIS — I4892 Unspecified atrial flutter: Secondary | ICD-10-CM

## 2014-02-26 DIAGNOSIS — R609 Edema, unspecified: Secondary | ICD-10-CM

## 2014-02-26 DIAGNOSIS — I2589 Other forms of chronic ischemic heart disease: Secondary | ICD-10-CM

## 2014-02-26 MED ORDER — LISINOPRIL 20 MG PO TABS: 20.0000 mg | ORAL_TABLET | Freq: Every day | ORAL | Status: DC

## 2014-02-26 MED ORDER — CARVEDILOL 6.25 MG PO TABS: 9.3750 mg | ORAL_TABLET | Freq: Two times a day (BID) | ORAL | Status: DC

## 2014-02-26 MED ORDER — FUROSEMIDE 40 MG PO TABS: 40.0000 mg | ORAL_TABLET | Freq: Two times a day (BID) | ORAL | Status: DC

## 2014-02-26 NOTE — Assessment & Plan Note (Signed)
11 pound weight gain recently with worsening leg edema. Recommended that he double his Lasix up to 40 mg twice a day to watch his fluid intake

## 2014-02-26 NOTE — Assessment & Plan Note (Signed)
Currently with no symptoms of angina. No further workup at this time. Continue current medication regimen. Continue aggressive cholesterol management

## 2014-02-26 NOTE — Progress Notes (Signed)
Patient ID: Colton Lamb, male    DOB: 1950-08-05, 63 y.o.   MRN: [id number]  HPI Comments: Colton Lamb is a 63 year old gentleman with a history of paroxysmal atrial fibrillation,  hyperlipidemia, history of Crohn's ileocolitis, coronary artery disease, PCI x3 to the LAD, circumflex, PDA and PL branch, ejection fraction 45-50% with anterior hypokinesis at that time,  catheterization in 2008  with a cypher stent to the OM, sleep apnea, has not been using CPAP.   2 recent hospital admissions.  Admitted in April 2015 with atrial flutter, non-ST elevation MI, with cardioversion, cardiac catheterization showing occluded LAD,  moderate distal RCA disease, patent left circumflex.. Medical management recommended.  Severely depressed ejection fraction noted, LifeVest was placed at that time. Ejection fraction 20-25%   hospital admission May 13 with discharge 12/14/2013 with dig toxicity, junctional rhythm, bradycardia with heart rates in the 30s, syncope.  Acute renal failure likely secondary to ATN. Also noted to have TSH of 10. ACE inhibitor, spironolactone, isosorbide was held. Renal function improved at the time of discharge  On his last clinic visit, he felt well with No lightheadedness or dizziness, no significant lower extremity edema. He was started on low-dose ACE inhibitor  Followup echocardiogram 01/29/2014 showing improved ejection fraction up to 30-35%  He states that he feels well, has had worsening lower extremity edema but in general has no complaints. His weight is up 11 pounds from his prior clinic visit, blood pressure higher He does drink significant fluids, takes Lasix 40 mg daily.  EKG shows normal sinus rhythm with rate 100 beats per minute, old anterior infarct, left axis deviation   Outpatient Encounter Prescriptions as of 02/26/2014  Medication Sig  . aspirin 81 MG tablet Take 81 mg by mouth daily.    Marland Kitchen atorvastatin (LIPITOR) 80 MG tablet TAKE 1 TABLET BY MOUTH EVERY  DAY  . balsalazide (COLAZAL) 750 MG capsule Take 1 capsule (750 mg total) by mouth 3 (three) times daily.  . furosemide (LASIX) 40 MG tablet Take 1 tablet (40 mg total) by mouth daily.  Marland Kitchen lisinopril (PRINIVIL,ZESTRIL) 5 MG tablet Take 1 tablet (5 mg total) by mouth daily.  . niacin (NIASPAN) 1000 MG CR tablet Take 1,000 mg by mouth at bedtime.  . nitroGLYCERIN (NITROSTAT) 0.4 MG SL tablet Place 0.4 mg under the tongue every 5 (five) minutes as needed.    Marland Kitchen omeprazole (PRILOSEC) 20 MG capsule Take 20 mg by mouth daily.    . rivaroxaban (XARELTO) 20 MG TABS tablet Take 1 tablet (20 mg total) by mouth daily with lunch.   Review of Systems  Constitutional: Positive for unexpected weight change.  HENT: Negative.   Eyes: Negative.   Respiratory: Negative.   Cardiovascular: Positive for leg swelling.  Gastrointestinal: Negative.   Endocrine: Negative.   Musculoskeletal: Negative.   Skin: Negative.   Allergic/Immunologic: Negative.   Neurological: Negative.   Hematological: Negative.   Psychiatric/Behavioral: Negative.   All other systems reviewed and are negative.   BP 132/92  Pulse 100  Ht 5' 11"  (1.803 m)  Wt 305 lb 8 oz (138.574 kg)  BMI 42.63 kg/m2  Physical Exam  Nursing note and vitals reviewed. Constitutional: He is oriented to person, place, and time. He appears well-developed and well-nourished.  Obese, LifeVest in place  HENT:  Head: Normocephalic.  Nose: Nose normal.  Mouth/Throat: Oropharynx is clear and moist.  Eyes: Conjunctivae are normal. Pupils are equal, round, and reactive to light.  Neck: Normal range of motion.  Neck supple. No JVD present.  Cardiovascular: Normal rate, regular rhythm, S1 normal, S2 normal, normal heart sounds and intact distal pulses.  Exam reveals no gallop and no friction rub.   No murmur heard. 1+ pitting edema to the midshins  Pulmonary/Chest: Effort normal and breath sounds normal. No respiratory distress. He has no wheezes. He has no  rales. He exhibits no tenderness.  Abdominal: Soft. Bowel sounds are normal. He exhibits no distension. There is no tenderness.  Musculoskeletal: Normal range of motion. He exhibits no edema and no tenderness.  Lymphadenopathy:    He has no cervical adenopathy.  Neurological: He is alert and oriented to person, place, and time. Coordination normal.  Skin: Skin is warm and dry. No rash noted. No erythema.  Psychiatric: He has a normal mood and affect. His behavior is normal. Judgment and thought content normal.      Assessment and Plan

## 2014-02-26 NOTE — Assessment & Plan Note (Signed)
Maintaining normal sinus rhythm. Strongly recommended he stay on his carvedilol. Heart rate markedly elevated today compared to prior visits

## 2014-02-26 NOTE — Assessment & Plan Note (Signed)
Cholesterol is at goal on the current lipid regimen. No changes to the medications were made.  

## 2014-02-26 NOTE — Assessment & Plan Note (Signed)
Minimal improvement in his ejection fraction over the past several months on medical management. EF still less than 35%. Will likely need evaluation by EP given his anterior wall  Scar from his LAD occlusion.

## 2014-02-26 NOTE — Assessment & Plan Note (Addendum)
Weight is up 11 pounds from his prior clinic visit in [place]. Worsening lower extremity edema. Heart rate higher. For unclear reasons, he stopped his carvedilol. Blood pressure up today.  We have talked to him about his fluid intake. Recommended he make sure he is taking his carvedilol twice a day. We will increase his lisinopril to 20 mg daily. Increase Lasix up to 40 mg twice a day. Strongly recommended he watch his weight daily and bring these numbers to CHF clinic  Ejection fraction on last echocardiogram continues to be less than 35%. He has followup with CHF clinic next week. Does not appear to have appointment for EP on his schedule. Will defer this to CHF clinic. This could potentially be done in [place] if it is more convenient for him, with Dr. Caryl Comes.

## 2014-02-26 NOTE — Patient Instructions (Addendum)
Please restart Coreg 9.375 mg twice a day  Please take lasix 40 mg twice a day Weight is up 11 pounds from her prior clinic visit Limit your fluid intake  Please increase the lisinopril up to 20 mg a day  Weight daily Followup with heart failure clinic next week  Please call us if you have new issues that need to be addressed before your next appt.  Your physician wants you to follow-up in: 3 months.  You will receive a reminder letter in the mail two months in advance. If you don't receive a letter, please call our office to schedule the follow-up appointment.

## 2014-02-26 NOTE — Telephone Encounter (Signed)
Spoke with Colton Lamb regarding the coreg dose that he stopped; Colton Lamb states, "I misunderstood the instructions when he was last seen by Dr. Haroldine Laws . Told the patient per Dr. Rockey Situ to start back on the Coreg at the increased of  9.375 mg BID. The patient understands the instructions given.

## 2014-03-03 ENCOUNTER — Telehealth (HOSPITAL_COMMUNITY): Payer: Self-pay | Admitting: Cardiology

## 2014-03-03 ENCOUNTER — Ambulatory Visit (HOSPITAL_COMMUNITY)
Admission: RE | Admit: 2014-03-03 | Discharge: 2014-03-03 | Disposition: A | Discharge status: Home / Self Care | Payer: BC Managed Care – PPO | Source: Ambulatory Visit | Attending: Internal Medicine | Admitting: Internal Medicine

## 2014-03-03 ENCOUNTER — Encounter (HOSPITAL_COMMUNITY): Payer: Self-pay | Admitting: *Deleted

## 2014-03-03 ENCOUNTER — Encounter (HOSPITAL_COMMUNITY): Payer: Self-pay

## 2014-03-03 VITALS — BP 146/92 | HR 104 | Wt 309.8 lb

## 2014-03-03 DIAGNOSIS — I4892 Unspecified atrial flutter: Secondary | ICD-10-CM

## 2014-03-03 LAB — CBC
HCT: 38.8 % — ABNORMAL LOW (ref 39.0–52.0)
Hemoglobin: 12.9 g/dL — ABNORMAL LOW (ref 13.0–17.0)
MCH: 27.9 pg (ref 26.0–34.0)
MCHC: 33.2 g/dL (ref 30.0–36.0)
MCV: 83.8 fL (ref 78.0–100.0)
Platelets: 257 10*3/uL (ref 150–400)
RBC: 4.63 MIL/uL (ref 4.22–5.81)
RDW: 15.3 % (ref 11.5–15.5)
WBC: 12.5 10*3/uL — AB (ref 4.0–10.5)

## 2014-03-03 LAB — PROTIME-INR
INR: 1.65 — AB (ref 0.00–1.49)
Prothrombin Time: 19.5 seconds — ABNORMAL HIGH (ref 11.6–15.2)

## 2014-03-03 LAB — BASIC METABOLIC PANEL
ANION GAP: 14 (ref 5–15)
BUN: 16 mg/dL (ref 6–23)
CALCIUM: 9 mg/dL (ref 8.4–10.5)
CO2: 27 meq/L (ref 19–32)
Chloride: 98 mEq/L (ref 96–112)
Creatinine, Ser: 0.95 mg/dL (ref 0.50–1.35)
GFR calc Af Amer: >90 mL/min (ref >90)
GFR calc non Af Amer: 87 mL/min — ABNORMAL LOW (ref >90)
GLUCOSE: 241 mg/dL — AB (ref 70–99)
POTASSIUM: 4.1 meq/L (ref 3.7–5.3)
SODIUM: 139 meq/L (ref 137–147)

## 2014-03-03 MED ORDER — POTASSIUM CHLORIDE CRYS ER 20 MEQ PO TBCR: 20.0000 meq | EXTENDED_RELEASE_TABLET | ORAL | Status: DC

## 2014-03-03 MED ORDER — AMIODARONE HCL 200 MG PO TABS: 400.0000 mg | ORAL_TABLET | Freq: Two times a day (BID) | ORAL | Status: DC

## 2014-03-03 MED ORDER — METOLAZONE 5 MG PO TABS: 5.0000 mg | ORAL_TABLET | ORAL | Status: DC

## 2014-03-03 NOTE — Telephone Encounter (Signed)
Pt scheduled for DCCV on 03/04/2014 Cpt code 92960 icd 9 code 427.32 With pts current insurance- [employer] No pre cert is required

## 2014-03-03 NOTE — Patient Instructions (Addendum)
Start Amiodarone 400 mg Twice daily   Take Metolazone 5 mg for 3 days along with Potassium 20 meq daily for 3 days  Labs today  Your physician has recommended that you have a Cardioversion (DCCV). Electrical Cardioversion uses a jolt of electricity to your heart either through paddles or wired patches attached to your chest. This is a controlled, usually prescheduled, procedure. Defibrillation is done under light anesthesia in the hospital, and you usually go home the day of the procedure. This is done to get your heart back into a normal rhythm. You are not awake for the procedure. Please see the instruction sheet given to you today.  Wednesday 03/05/14 PLEASE SEE INSTRUCTION SHEET  Your physician recommends that you schedule a follow-up appointment in: Friday

## 2014-03-03 NOTE — Progress Notes (Signed)
Patient ID: Colton Lamb, male   DOB: 02-10-1951, 63 y.o.   MRN: [id number]  PCP: Dr. Harle Stanford [facility]) Primary Cardiologist: Dr. Rockey Situ  HPI:  Mr. Razzano is a 63 year old gentleman with a history of paroxysmal atrial fibrillation, hyperlipidemia, coronary artery disease and chronic systolic HF EF 16-07%.  Presented to the hospital with NSTEMI 4/24- 12/01/13 He was found to be in A-flutter and was started on cardizem and heparin gtt. Pertinent labs on admission Troponin 17, Hgb A1C 7.9, K+ 4.5 and Cr 1.16. Underwent cath (results below) and DC-CV which was successful. Diuresed with IV lasix and discharge weight 289 lbs. He has some NSVT in the hospital and LifeVest was placed. Treated medically as LAD occlusion thought to be chronic with diffuse Qs across precordium   LHC/ LEFT VENTRICULOGRAM  -Aortic pressure 120/93, LV pressur 126/44, LVEDP 46  -L main widely patent  -LAD totally occluded promially within previously placed stent  -LCx widely patent  -RCA co-dominant; distal RCA leading into PDA contains segmental 70% stenosis  - Dilated LV and and severe dysfunction, estimate LVEF 20% or less, possible LV thrombus   ECHO (10/2013): EF 20-25%, diff HK, grade III DD, RV mildly dilated and sys fx mild/mod reduced. ECHO 01/29/2014: Poor windows. EF ~25% RV mild to mod reduced  Follow-up: Saw Dr. Rockey Situ last week. Weight up 11 pounds. Lasix doubled to 40 bid. Has not seen any improvement. Weight up a few more pounds. Denies dyspnea, orthopnea or PND. Can walk around store without problem. Mild edema. No firing of LifeVest. Trying to watch    ROS: All systems negative except as listed in HPI, PMH and Problem List.  Past Medical History  Diagnosis Date  . Crohn's ileocolitis   . GERD (gastroesophageal reflux disease)   . Hx of adenomatous colonic polyps 11/2003  . Coronary artery disease     s/p stent placement  . MI (myocardial infarction) 2007  . Hypertension   . Obesity   .  Atrial fibrillation   . Chronic systolic heart failure     a) EF 20-25%, grade III DD, RV mildly dilated and sys fx mild/mod reduced (10/2013)  . Ischemic cardiomyopathy     a) LHC (10/2013): LAD stent occlusion, duration unknown. QS pattern across precordium on EKG suggest not acute, moderate distal RCA obstruction and widely patent circumflex coronary artery  . Atrial flutter     a) s/p Cardioversion 11/22/13, on amiodarone and Xarelto  . Dysrhythmia     ATRIAL FIBRILATION  . Syncope 11/2013    in setting of volume depletion and bradycardia due to dig toxicity   . CHF (congestive heart failure)     Current Outpatient Prescriptions  Medication Sig Dispense Refill  . aspirin 81 MG tablet Take 81 mg by mouth daily.        Marland Kitchen atorvastatin (LIPITOR) 80 MG tablet TAKE 1 TABLET BY MOUTH EVERY DAY  30 tablet  3  . balsalazide (COLAZAL) 750 MG capsule Take 1 capsule (750 mg total) by mouth 3 (three) times daily.  90 capsule  11  . carvedilol (COREG) 6.25 MG tablet Take 1.5 tablets (9.375 mg total) by mouth 2 (two) times daily.  90 tablet  6  . furosemide (LASIX) 40 MG tablet Take 1 tablet (40 mg total) by mouth 2 (two) times daily.  60 tablet  6  . lisinopril (PRINIVIL,ZESTRIL) 20 MG tablet Take 1 tablet (20 mg total) by mouth daily.  30 tablet  6  . niacin (  NIASPAN) 1000 MG CR tablet Take 1,000 mg by mouth at bedtime.      . nitroGLYCERIN (NITROSTAT) 0.4 MG SL tablet Place 0.4 mg under the tongue every 5 (five) minutes as needed.        Marland Kitchen omeprazole (PRILOSEC) 20 MG capsule Take 20 mg by mouth daily.        . rivaroxaban (XARELTO) 20 MG TABS tablet Take 1 tablet (20 mg total) by mouth daily with lunch.  30 tablet  11   No current facility-administered medications for this encounter.    Filed Vitals:   03/03/14 1433  BP: 146/92  Pulse: 104  Weight: 309 lb 12.8 oz (140.524 kg)  SpO2: 95%   PHYSICAL EXAM: General: Well appearing. + dyspneic HEENT: normal  Neck: supple. JVP difficult to  assess d/t body habitus ; Carotids 2+ bilat; no bruits. No lymphadenopathy or thryomegaly appreciated.  Cor: PMI nondisplaced. Distant heart sounds. Irregular rate & rhythm. No rubs, gallops or murmurs. LifeVest intact Lungs: clear  Abdomen: soft, nontender, + distended. No hepatosplenomegaly. No bruits or masses. Good bowel sounds.  Extremities: no cyanosis, clubbing, rash, 2+ ankle edema Neuro: alert & orientedx3, cranial nerves grossly intact. moves all 4 extremities w/o difficulty. Affect pleasant  ECG AFL 104   ASSESSMENT & PLAN:  1) Chronic systolic HF: ICM, EF 24-46%, grade III DD, RV mildly dilated and sys fx mild/mod reduced (10/2013). Echo 7/15 EF 30-35% - Volume status up in setting of recurrent AFL - Add metolazone 5 x 3 days with kcl 20  - Plan DC-CV this week. Start amiodarone 400 bid - EF improving slowly. Continue to titrate meds. Continue Lifevest.Repeat echo 2 months. If less than 35% will refer to EP for ICD for primary prevention. QRS narrow, no CRT-D.  - Reinforced the need and importance of daily weights, a low sodium diet, and fluid restriction (less than 2 L a day). Instructed to call the HF clinic if weight increases more than 3 lbs overnight or 5 lbs in a week.  - Return to clinic next week. 2) HTN - stable.  3) CAD - occluded LAD stent unknown duration. No PCI performed on 11/22/13. Continue medical management  - Continue aspirin 81 and statin. May be able to d/c asa soon given Xarelto but would await at least 6 months after NSTEM 4) ?OSA - Pending sleep study  5) Obesity - Discussed with patient watching portion control and trying to be as active as possible.  6) Atrial flutter - s/p s/p Cardioversion 11/22/13. Back in AFL - Plan DC-Cv this wekk. Start amio 400 bid. Continue Xarelto. - Refer to EP for AFL ablation - D/w Dr. Melissa Noon MD  2:58 PM

## 2014-03-04 ENCOUNTER — Other Ambulatory Visit: Payer: Self-pay | Admitting: *Deleted

## 2014-03-05 ENCOUNTER — Ambulatory Visit (HOSPITAL_COMMUNITY): Payer: BC Managed Care – PPO | Admitting: Anesthesiology

## 2014-03-05 ENCOUNTER — Telehealth (HOSPITAL_COMMUNITY): Payer: Self-pay | Admitting: Anesthesiology

## 2014-03-05 ENCOUNTER — Encounter (HOSPITAL_COMMUNITY): Payer: BC Managed Care – PPO | Admitting: Anesthesiology

## 2014-03-05 ENCOUNTER — Encounter (HOSPITAL_COMMUNITY): Payer: Self-pay

## 2014-03-05 ENCOUNTER — Encounter (HOSPITAL_COMMUNITY)
Admission: RE | Disposition: A | Discharge status: Home / Self Care | Payer: Self-pay | Source: Ambulatory Visit | Attending: Internal Medicine

## 2014-03-05 ENCOUNTER — Ambulatory Visit (HOSPITAL_COMMUNITY)
Admission: RE | Admit: 2014-03-05 | Discharge: 2014-03-05 | Disposition: A | Discharge status: Home / Self Care | Payer: BC Managed Care – PPO | Source: Ambulatory Visit | Attending: Internal Medicine | Admitting: Internal Medicine

## 2014-03-05 DIAGNOSIS — I2510 Atherosclerotic heart disease of native coronary artery without angina pectoris: Secondary | ICD-10-CM | POA: Insufficient documentation

## 2014-03-05 DIAGNOSIS — E785 Hyperlipidemia, unspecified: Secondary | ICD-10-CM | POA: Diagnosis not present

## 2014-03-05 DIAGNOSIS — Z7982 Long term (current) use of aspirin: Secondary | ICD-10-CM | POA: Insufficient documentation

## 2014-03-05 DIAGNOSIS — K219 Gastro-esophageal reflux disease without esophagitis: Secondary | ICD-10-CM | POA: Diagnosis not present

## 2014-03-05 DIAGNOSIS — I4891 Unspecified atrial fibrillation: Secondary | ICD-10-CM | POA: Diagnosis not present

## 2014-03-05 DIAGNOSIS — E669 Obesity, unspecified: Secondary | ICD-10-CM | POA: Diagnosis not present

## 2014-03-05 DIAGNOSIS — I4892 Unspecified atrial flutter: Secondary | ICD-10-CM | POA: Insufficient documentation

## 2014-03-05 DIAGNOSIS — I10 Essential (primary) hypertension: Secondary | ICD-10-CM | POA: Diagnosis not present

## 2014-03-05 DIAGNOSIS — Z79899 Other long term (current) drug therapy: Secondary | ICD-10-CM | POA: Insufficient documentation

## 2014-03-05 DIAGNOSIS — I5022 Chronic systolic (congestive) heart failure: Secondary | ICD-10-CM | POA: Insufficient documentation

## 2014-03-05 DIAGNOSIS — I509 Heart failure, unspecified: Secondary | ICD-10-CM | POA: Insufficient documentation

## 2014-03-05 HISTORY — PX: CARDIOVERSION: SHX1299

## 2014-03-05 SURGERY — CARDIOVERSION
Anesthesia: General

## 2014-03-05 MED ORDER — SILVER SULFADIAZINE 1 % EX CREA: 1.0000 "application " | TOPICAL_CREAM | Freq: Every day | CUTANEOUS | Status: DC

## 2014-03-05 MED ORDER — PROPOFOL 10 MG/ML IV BOLUS
INTRAVENOUS | Status: DC | PRN
  Administered 2014-03-05: 70 mg via INTRAVENOUS

## 2014-03-05 MED ORDER — SODIUM CHLORIDE 0.9 % IV SOLN
INTRAVENOUS | Status: DC
  Administered 2014-03-05: 500 mL via INTRAVENOUS

## 2014-03-05 NOTE — Transfer of Care (Signed)
Immediate Anesthesia Transfer of Care Note  Patient: Colton Lamb  Procedure(s) Performed: Procedure(s): CARDIOVERSION (N/A)  Patient Location: PACU and Endoscopy Unit  Anesthesia Type:MAC  Level of Consciousness: awake, alert , oriented and sedated  Airway & Oxygen Therapy: Patient Spontanous Breathing and Patient connected to nasal cannula oxygen  Post-op Assessment: Report given to PACU RN, Post -op Vital signs reviewed and stable and Patient moving all extremities  Post vital signs: Reviewed and stable  Complications: No apparent anesthesia complications

## 2014-03-05 NOTE — CV Procedure (Signed)
     DIRECT CURRENT CARDIOVERSION  NAME:  Colton Lamb   MRN: [id number] DOB:  04-11-1951   ADMIT DATE: 03/05/2014   INDICATIONS: Atrial flutter   PROCEDURE:   Informed consent was obtained prior to the procedure. The risks, benefits and alternatives for the procedure were discussed and the patient comprehended these risks. Once an appropriate time out was taken, the patient had the defibrillator pads placed in the anterior and posterior position. The patient then underwent sedation by the anesthesia service. Once an appropriate level of sedation was achieved, the patient received a s biphasic, synchronized 200J shock with in initial slowing of his rhythm to reveal a typical flutter. He then received a second 200J shock  And converted to sinus rhythm. No apparent complications.  Kamla Skilton,MD 1:52 PM

## 2014-03-05 NOTE — Anesthesia Preprocedure Evaluation (Addendum)
Anesthesia Evaluation  Patient identified by MRN, date of birth, ID band Patient awake    Reviewed: Allergy & Precautions, H&P , NPO status , Patient's Chart, lab work & pertinent test results  Airway Mallampati: III TM Distance: >3 FB     Dental  (+) Teeth Intact   Pulmonary    + decreased breath sounds      Cardiovascular hypertension, Rhythm:Irregular Rate:Tachycardia     Neuro/Psych    GI/Hepatic   Endo/Other    Renal/GU      Musculoskeletal   Abdominal   Peds  Hematology   Anesthesia Other Findings   Reproductive/Obstetrics                          Anesthesia Physical Anesthesia Plan  ASA: III  Anesthesia Plan: General   Post-op Pain Management:    Induction: Intravenous  Airway Management Planned: Mask  Additional Equipment:   Intra-op Plan:   Post-operative Plan:   Informed Consent: I have reviewed the patients History and Physical, chart, labs and discussed the procedure including the risks, benefits and alternatives for the proposed anesthesia with the patient or authorized representative who has indicated his/her understanding and acceptance.     Plan Discussed with: CRNA and Anesthesiologist  Anesthesia Plan Comments:         Anesthesia Quick Evaluation

## 2014-03-05 NOTE — Telephone Encounter (Signed)
Sent Rx for silvadene for burn s/p DC-CV

## 2014-03-05 NOTE — H&P (View-Only) (Signed)
Patient ID: Colton Lamb, male   DOB: Nov 23, 1950, 63 y.o.   MRN: [id number]  PCP: Dr. Harle Stanford [facility]) Primary Cardiologist: Dr. Rockey Situ  HPI:  [name] is a 63 year old gentleman with a history of paroxysmal atrial fibrillation, hyperlipidemia, coronary artery disease and chronic systolic HF EF 29-92%.  Presented to the hospital with NSTEMI 4/24- 12/01/13 He was found to be in A-flutter and was started on cardizem and heparin gtt. Pertinent labs on admission Troponin 17, Hgb A1C 7.9, K+ 4.5 and Cr 1.16. Underwent cath (results below) and DC-CV which was successful. Diuresed with IV lasix and discharge weight 289 lbs. He has some NSVT in the hospital and LifeVest was placed. Treated medically as LAD occlusion thought to be chronic with diffuse Qs across precordium   LHC/ LEFT VENTRICULOGRAM  -Aortic pressure 120/93, LV pressur 126/44, LVEDP 46  -L main widely patent  -LAD totally occluded promially within previously placed stent  -LCx widely patent  -RCA co-dominant; distal RCA leading into PDA contains segmental 70% stenosis  - Dilated LV and and severe dysfunction, estimate LVEF 20% or less, possible LV thrombus   ECHO (10/2013): EF 20-25%, diff HK, grade III DD, RV mildly dilated and sys fx mild/mod reduced. ECHO 01/29/2014: Poor windows. EF ~25% RV mild to mod reduced  Follow-up: Saw Dr. Rockey Situ last week. Weight up 11 pounds. Lasix doubled to 40 bid. Has not seen any improvement. Weight up a few more pounds. Denies dyspnea, orthopnea or PND. Can walk around store without problem. Mild edema. No firing of LifeVest. Trying to watch    ROS: All systems negative except as listed in HPI, PMH and Problem List.  Past Medical History  Diagnosis Date  . Crohn's ileocolitis   . GERD (gastroesophageal reflux disease)   . Hx of adenomatous colonic polyps 11/2003  . Coronary artery disease     s/p stent placement  . MI (myocardial infarction) 2007  . Hypertension   . Obesity   .  Atrial fibrillation   . Chronic systolic heart failure     a) EF 20-25%, grade III DD, RV mildly dilated and sys fx mild/mod reduced (10/2013)  . Ischemic cardiomyopathy     a) LHC (10/2013): LAD stent occlusion, duration unknown. QS pattern across precordium on EKG suggest not acute, moderate distal RCA obstruction and widely patent circumflex coronary artery  . Atrial flutter     a) s/p Cardioversion 11/22/13, on amiodarone and Xarelto  . Dysrhythmia     ATRIAL FIBRILATION  . Syncope 11/2013    in setting of volume depletion and bradycardia due to dig toxicity   . CHF (congestive heart failure)     Current Outpatient Prescriptions  Medication Sig Dispense Refill  . aspirin 81 MG tablet Take 81 mg by mouth daily.        Marland Kitchen atorvastatin (LIPITOR) 80 MG tablet TAKE 1 TABLET BY MOUTH EVERY DAY  30 tablet  3  . balsalazide (COLAZAL) 750 MG capsule Take 1 capsule (750 mg total) by mouth 3 (three) times daily.  90 capsule  11  . carvedilol (COREG) 6.25 MG tablet Take 1.5 tablets (9.375 mg total) by mouth 2 (two) times daily.  90 tablet  6  . furosemide (LASIX) 40 MG tablet Take 1 tablet (40 mg total) by mouth 2 (two) times daily.  60 tablet  6  . lisinopril (PRINIVIL,ZESTRIL) 20 MG tablet Take 1 tablet (20 mg total) by mouth daily.  30 tablet  6  . niacin (  NIASPAN) 1000 MG CR tablet Take 1,000 mg by mouth at bedtime.      . nitroGLYCERIN (NITROSTAT) 0.4 MG SL tablet Place 0.4 mg under the tongue every 5 (five) minutes as needed.        Marland Kitchen omeprazole (PRILOSEC) 20 MG capsule Take 20 mg by mouth daily.        . rivaroxaban (XARELTO) 20 MG TABS tablet Take 1 tablet (20 mg total) by mouth daily with lunch.  30 tablet  11   No current facility-administered medications for this encounter.    Filed Vitals:   03/03/14 1433  BP: 146/92  Pulse: 104  Weight: 309 lb 12.8 oz (140.524 kg)  SpO2: 95%   PHYSICAL EXAM: General: Well appearing. + dyspneic HEENT: normal  Neck: supple. JVP difficult to  assess d/t body habitus ; Carotids 2+ bilat; no bruits. No lymphadenopathy or thryomegaly appreciated.  Cor: PMI nondisplaced. Distant heart sounds. Irregular rate & rhythm. No rubs, gallops or murmurs. LifeVest intact Lungs: clear  Abdomen: soft, nontender, + distended. No hepatosplenomegaly. No bruits or masses. Good bowel sounds.  Extremities: no cyanosis, clubbing, rash, 2+ ankle edema Neuro: alert & orientedx3, cranial nerves grossly intact. moves all 4 extremities w/o difficulty. Affect pleasant  ECG AFL 104   ASSESSMENT & PLAN:  1) Chronic systolic HF: ICM, EF 11-94%, grade III DD, RV mildly dilated and sys fx mild/mod reduced (10/2013). Echo 7/15 EF 30-35% - Volume status up in setting of recurrent AFL - Add metolazone 5 x 3 days with kcl 20  - Plan DC-CV this week. Start amiodarone 400 bid - EF improving slowly. Continue to titrate meds. Continue Lifevest.Repeat echo 2 months. If less than 35% will refer to EP for ICD for primary prevention. QRS narrow, no CRT-D.  - Reinforced the need and importance of daily weights, a low sodium diet, and fluid restriction (less than 2 L a day). Instructed to call the HF clinic if weight increases more than 3 lbs overnight or 5 lbs in a week.  - Return to clinic next week. 2) HTN - stable.  3) CAD - occluded LAD stent unknown duration. No PCI performed on 11/22/13. Continue medical management  - Continue aspirin 81 and statin. May be able to d/c asa soon given Xarelto but would await at least 6 months after NSTEM 4) ?OSA - Pending sleep study  5) Obesity - Discussed with patient watching portion control and trying to be as active as possible.  6) Atrial flutter - s/p s/p Cardioversion 11/22/13. Back in AFL - Plan DC-Cv this wekk. Start amio 400 bid. Continue Xarelto. - Refer to EP for AFL ablation - D/w Dr. Melissa Noon MD  2:58 PM

## 2014-03-05 NOTE — Anesthesia Postprocedure Evaluation (Signed)
  Anesthesia Post-op Note  Patient: Colton Lamb  Procedure(s) Performed: Procedure(s): CARDIOVERSION (N/A)  Patient Location: Endoscopy Unit  Anesthesia Type:General  Level of Consciousness: awake, alert  and oriented  Airway and Oxygen Therapy: Patient Spontanous Breathing and Patient connected to nasal cannula oxygen  Post-op Pain: none  Post-op Assessment: Post-op Vital signs reviewed, Patient's Cardiovascular Status Stable, Respiratory Function Stable, Patent Airway and Pain level controlled  Post-op Vital Signs: stable  Last Vitals:  Filed Vitals:   03/05/14 1450  BP: 124/70  Pulse: 87  Temp:   Resp: 23    Complications: No apparent anesthesia complications

## 2014-03-05 NOTE — OR Nursing (Signed)
1351 IV 70 mg diprivan, Dr. Linna Caprice 1353 Cardioverted 200 joules to AFlutter 2508 Cardioverted 200 joules to sinus Chest wall red and irritated under pad site.  Dr. Haroldine Laws aware.

## 2014-03-05 NOTE — Discharge Instructions (Signed)

## 2014-03-05 NOTE — Interval H&P Note (Signed)
History and Physical Interval Note:  03/05/2014 1:51 PM  Colton Lamb  has presented today for surgery, with the diagnosis of AFLUTTER  The various methods of treatment have been discussed with the patient and family. After consideration of risks, benefits and other options for treatment, the patient has consented to  Procedure(s): CARDIOVERSION (N/A) as a surgical intervention .  The patient's history has been reviewed, patient examined, no change in status, stable for surgery.  I have reviewed the patient's chart and labs.  Questions were answered to the patient's satisfaction.     Romilda Proby

## 2014-03-06 ENCOUNTER — Encounter (HOSPITAL_COMMUNITY): Payer: Self-pay | Admitting: Internal Medicine

## 2014-03-10 ENCOUNTER — Ambulatory Visit (HOSPITAL_COMMUNITY)
Admission: RE | Admit: 2014-03-10 | Discharge: 2014-03-10 | Disposition: A | Discharge status: Home / Self Care | Payer: BC Managed Care – PPO | Source: Ambulatory Visit | Attending: Internal Medicine | Admitting: Internal Medicine

## 2014-03-10 ENCOUNTER — Encounter (HOSPITAL_COMMUNITY): Payer: Self-pay

## 2014-03-10 VITALS — BP 108/70 | HR 82 | Wt 293.4 lb

## 2014-03-10 DIAGNOSIS — I4891 Unspecified atrial fibrillation: Secondary | ICD-10-CM

## 2014-03-10 DIAGNOSIS — I10 Essential (primary) hypertension: Secondary | ICD-10-CM | POA: Insufficient documentation

## 2014-03-10 DIAGNOSIS — K5000 Crohn's disease of small intestine without complications: Secondary | ICD-10-CM | POA: Insufficient documentation

## 2014-03-10 DIAGNOSIS — E669 Obesity, unspecified: Secondary | ICD-10-CM | POA: Insufficient documentation

## 2014-03-10 DIAGNOSIS — Z7982 Long term (current) use of aspirin: Secondary | ICD-10-CM | POA: Insufficient documentation

## 2014-03-10 DIAGNOSIS — I252 Old myocardial infarction: Secondary | ICD-10-CM | POA: Insufficient documentation

## 2014-03-10 DIAGNOSIS — I2589 Other forms of chronic ischemic heart disease: Secondary | ICD-10-CM | POA: Insufficient documentation

## 2014-03-10 DIAGNOSIS — E785 Hyperlipidemia, unspecified: Secondary | ICD-10-CM | POA: Insufficient documentation

## 2014-03-10 DIAGNOSIS — I509 Heart failure, unspecified: Secondary | ICD-10-CM | POA: Insufficient documentation

## 2014-03-10 DIAGNOSIS — I2510 Atherosclerotic heart disease of native coronary artery without angina pectoris: Secondary | ICD-10-CM | POA: Insufficient documentation

## 2014-03-10 DIAGNOSIS — K219 Gastro-esophageal reflux disease without esophagitis: Secondary | ICD-10-CM | POA: Insufficient documentation

## 2014-03-10 DIAGNOSIS — I5022 Chronic systolic (congestive) heart failure: Secondary | ICD-10-CM

## 2014-03-10 LAB — BASIC METABOLIC PANEL
ANION GAP: 15 (ref 5–15)
BUN: 28 mg/dL — ABNORMAL HIGH (ref 6–23)
CALCIUM: 9.8 mg/dL (ref 8.4–10.5)
CO2: 32 mEq/L (ref 19–32)
CREATININE: 1.25 mg/dL (ref 0.50–1.35)
Chloride: 87 mEq/L — ABNORMAL LOW (ref 96–112)
GFR calc non Af Amer: 60 mL/min — ABNORMAL LOW (ref >90)
GFR, EST AFRICAN AMERICAN: 70 mL/min — AB (ref >90)
Glucose, Bld: 333 mg/dL — ABNORMAL HIGH (ref 70–99)
Potassium: 4.5 mEq/L (ref 3.7–5.3)
SODIUM: 134 meq/L — AB (ref 137–147)

## 2014-03-10 MED ORDER — CARVEDILOL 12.5 MG PO TABS: 12.5000 mg | ORAL_TABLET | Freq: Two times a day (BID) | ORAL | Status: DC

## 2014-03-10 MED ORDER — AMIODARONE HCL 200 MG PO TABS: 200.0000 mg | ORAL_TABLET | Freq: Two times a day (BID) | ORAL | Status: DC

## 2014-03-10 MED ORDER — CARVEDILOL 6.25 MG PO TABS: 12.5000 mg | ORAL_TABLET | Freq: Two times a day (BID) | ORAL | Status: DC

## 2014-03-10 NOTE — Progress Notes (Signed)
Patient ID: Colton Lamb, male   DOB: Aug 03, 1950, 63 y.o.   MRN: [id number]  PCP: Dr. Harle Stanford [facility]) Primary Cardiologist: Dr. Rockey Situ  HPI:  Colton Lamb is a 63 year old gentleman with a history of paroxysmal atrial fibrillation, hyperlipidemia, coronary artery disease and chronic systolic HF EF 24-40%.  Presented to the hospital with NSTEMI 4/24- 12/01/13 He was found to be in A-flutter and was started on cardizem and heparin gtt. Pertinent labs on admission Troponin 17, Hgb A1C 7.9, K+ 4.5 and Cr 1.16. Underwent cath (results below) and DC-CV which was successful. Diuresed with IV lasix and discharge weight 289 lbs. He has some NSVT in the hospital and LifeVest was placed. Treated medically as LAD occlusion thought to be chronic with diffuse Qs across precordium   LHC/ LEFT VENTRICULOGRAM  -Aortic pressure 120/93, LV pressur 126/44, LVEDP 46  -L main widely patent  -LAD totally occluded promially within previously placed stent  -LCx widely patent  -RCA co-dominant; distal RCA leading into PDA contains segmental 70% stenosis  - Dilated LV and and severe dysfunction, estimate LVEF 20% or less, possible LV thrombus   ECHO (10/2013): EF 20-25%, diff HK, grade III DD, RV mildly dilated and sys fx mild/mod reduced. ECHO 01/29/2014: Poor windows. EF ~25% RV mild to mod reduced  DC-CV of AFL on 03/05/14  Follow-up: At last visit weight up about 15 pounds in setting of recurrent AFL. Underwent DC-CV on 03/05/14. Feels better. Weight back down to 293 which in under baseline 294-298.  Denies dyspnea, orthopnea or PND. Can walk around store without problem. No edema. No firing of LifeVest. Taking lasix 63m bid. No bleeding with Xarelto.    ROS: All systems negative except as listed in HPI, PMH and Problem List.  Past Medical History  Diagnosis Date  . Crohn's ileocolitis   . GERD (gastroesophageal reflux disease)   . Hx of adenomatous colonic polyps 11/2003  . Coronary artery disease     s/p stent placement  . MI (myocardial infarction) 2007  . Hypertension   . Obesity   . Atrial fibrillation   . Chronic systolic heart failure     a) EF 20-25%, grade III DD, RV mildly dilated and sys fx mild/mod reduced (10/2013)  . Ischemic cardiomyopathy     a) LHC (10/2013): LAD stent occlusion, duration unknown. QS pattern across precordium on EKG suggest not acute, moderate distal RCA obstruction and widely patent circumflex coronary artery  . Atrial flutter     a) s/p Cardioversion 11/22/13, on amiodarone and Xarelto  . Dysrhythmia     ATRIAL FIBRILATION  . Syncope 11/2013    in setting of volume depletion and bradycardia due to dig toxicity   . CHF (congestive heart failure)     Current Outpatient Prescriptions  Medication Sig Dispense Refill  . amiodarone (PACERONE) 200 MG tablet Take 2 tablets (400 mg total) by mouth 2 (two) times daily.  120 tablet  3  . aspirin 81 MG tablet Take 81 mg by mouth daily.        .Marland Kitchenatorvastatin (LIPITOR) 80 MG tablet TAKE 1 TABLET BY MOUTH EVERY DAY  30 tablet  3  . balsalazide (COLAZAL) 750 MG capsule Take 1 capsule (750 mg total) by mouth 3 (three) times daily.  90 capsule  11  . carvedilol (COREG) 6.25 MG tablet Take 1.5 tablets (9.375 mg total) by mouth 2 (two) times daily.  90 tablet  6  . furosemide (LASIX) 40 MG tablet Take 1  tablet (40 mg total) by mouth 2 (two) times daily.  60 tablet  6  . lisinopril (PRINIVIL,ZESTRIL) 20 MG tablet Take 1 tablet (20 mg total) by mouth daily.  30 tablet  6  . metolazone (ZAROXOLYN) 5 MG tablet Take 1 tablet (5 mg total) by mouth as directed.  10 tablet  3  . niacin (NIASPAN) 1000 MG CR tablet Take 1,000 mg by mouth at bedtime.      . nitroGLYCERIN (NITROSTAT) 0.4 MG SL tablet Place 0.4 mg under the tongue every 5 (five) minutes as needed.        Marland Kitchen omeprazole (PRILOSEC) 20 MG capsule Take 20 mg by mouth daily.        . potassium chloride SA (K-DUR,KLOR-CON) 20 MEQ tablet Take 1 tablet (20 mEq total) by mouth  as directed. When you take Metolazone  10 tablet  3  . rivaroxaban (XARELTO) 20 MG TABS tablet Take 1 tablet (20 mg total) by mouth daily with lunch.  30 tablet  11  . silver sulfADIAZINE (SILVADENE) 1 % cream Apply 1 application topically daily.  50 g  0   No current facility-administered medications for this encounter.    Filed Vitals:   03/10/14 1504  BP: 108/70  Pulse: 82  Weight: 293 lb 6.4 oz (133.085 kg)  SpO2: 98%   PHYSICAL EXAM: General: Well appearing. HEENT: normal  Neck: supple. JVP difficult to assess d/t body habitus ; Carotids 2+ bilat; no bruits. No lymphadenopathy or thryomegaly appreciated.  Cor: PMI nondisplaced. Distant heart sounds. Irregular rate & rhythm. No rubs, gallops or murmurs. LifeVest intact Lungs: clear  Abdomen: soft, nontender, + distended. No hepatosplenomegaly. No bruits or masses. Good bowel sounds.  Extremities: no cyanosis, clubbing, rash, 2+ ankle edema Neuro: alert & orientedx3, cranial nerves grossly intact. moves all 4 extremities w/o difficulty. Affect pleasant  ECG NSR 79   ASSESSMENT & PLAN:  1) Chronic systolic HF: ICM, EF 61-60%, grade III DD, RV mildly dilated and sys fx mild/mod reduced (10/2013). Echo 7/15 EF 30-35% - Volume status much improved with restoration of NSR - Increase carvedilol to 12.5 bid - EF improving slowly. Continue to titrate meds. Continue Lifevest.Repeat echo 2 months. If less than 35% will refer to EP for ICD for primary prevention. QRS narrow, no CRT-D.  - Reinforced the need and importance of daily weights, a low sodium diet, and fluid restriction (less than 2 L a day). Instructed to call the HF clinic if weight increases more than 3 lbs overnight or 5 lbs in a week.  - Return to clinic next week. 2) AFL  - Back in NSR aft DC-CV. - Decrease amio to 200 daily. Continue Xarelto -Has f/u with Dr. Lovena Le for ablation evaluation 3) HTN - stable.  4) CAD - occluded LAD stent unknown duration. No PCI  performed on 11/22/13. Continue medical management  - Continue aspirin 81 and statin. May be able to d/c asa soon given Xarelto but would await at least 6 months after NSTEM 5) ?OSA - Pending sleep study 8/30   Glori Bickers MD  3:31 PM

## 2014-03-10 NOTE — Patient Instructions (Addendum)
Your physician recommends that you schedule a follow-up appointment in: 1 month   DECREASE Amirodarone to 200 mg twice a day INCREASE Carvedilol to 12.5 mg twice a day  Do the following things EVERYDAY: 1) Weigh yourself in the morning before breakfast. Write it down and keep it in a log. 2) Take your medicines as prescribed 3) Eat low salt foods-Limit salt (sodium) to 2000 mg per day.  4) Stay as active as you can everyday 5) Limit all fluids for the day to less than 2 liters 6)

## 2014-03-21 ENCOUNTER — Encounter (HOSPITAL_COMMUNITY): Payer: Self-pay | Admitting: Internal Medicine

## 2014-03-21 NOTE — Addendum Note (Signed)
Addendum created 03/21/14 1235 by Josephine Igo, CRNA   Modules edited: Anesthesia Events

## 2014-03-23 ENCOUNTER — Other Ambulatory Visit (HOSPITAL_COMMUNITY): Payer: Self-pay | Admitting: Physician Assistant

## 2014-03-26 ENCOUNTER — Telehealth (HOSPITAL_COMMUNITY): Payer: Self-pay | Admitting: *Deleted

## 2014-03-26 DIAGNOSIS — I5021 Acute systolic (congestive) heart failure: Secondary | ICD-10-CM

## 2014-03-26 NOTE — Telephone Encounter (Signed)
Pt aware.

## 2014-03-26 NOTE — Telephone Encounter (Signed)
Message copied by Scarlette Calico on Wed Mar 26, 2014  2:19 PM ------      Message from: Glori Bickers R      Created: Thu Mar 13, 2014  4:13 PM       Renal function slightly worse. Repeat 1 week. Please send to PCP to address [address]. ------

## 2014-03-28 ENCOUNTER — Encounter: Payer: Self-pay | Admitting: Gastroenterology

## 2014-03-28 NOTE — Telephone Encounter (Signed)
rx for labs mailed to pt he will take to [facility] new his home

## 2014-03-30 ENCOUNTER — Encounter (HOSPITAL_BASED_OUTPATIENT_CLINIC_OR_DEPARTMENT_OTHER): Payer: BC Managed Care – PPO

## 2014-03-31 NOTE — Telephone Encounter (Signed)
This encounter was created in error - please disregard.

## 2014-04-03 ENCOUNTER — Ambulatory Visit: Payer: BC Managed Care – PPO | Admitting: Internal Medicine

## 2014-04-03 ENCOUNTER — Encounter: Payer: Self-pay | Admitting: *Deleted

## 2014-04-03 ENCOUNTER — Ambulatory Visit (INDEPENDENT_AMBULATORY_CARE_PROVIDER_SITE_OTHER): Payer: BC Managed Care – PPO | Admitting: Internal Medicine

## 2014-04-03 ENCOUNTER — Encounter: Payer: Self-pay | Admitting: Internal Medicine

## 2014-04-03 VITALS — BP 100/68 | HR 76 | Ht 71.0 in | Wt 297.0 lb

## 2014-04-03 DIAGNOSIS — I509 Heart failure, unspecified: Secondary | ICD-10-CM

## 2014-04-03 DIAGNOSIS — I5041 Acute combined systolic (congestive) and diastolic (congestive) heart failure: Secondary | ICD-10-CM

## 2014-04-03 DIAGNOSIS — I255 Ischemic cardiomyopathy: Secondary | ICD-10-CM

## 2014-04-03 DIAGNOSIS — I4892 Unspecified atrial flutter: Secondary | ICD-10-CM

## 2014-04-03 DIAGNOSIS — I483 Typical atrial flutter: Secondary | ICD-10-CM

## 2014-04-03 DIAGNOSIS — I2589 Other forms of chronic ischemic heart disease: Secondary | ICD-10-CM

## 2014-04-03 DIAGNOSIS — Z01812 Encounter for preprocedural laboratory examination: Secondary | ICD-10-CM

## 2014-04-03 NOTE — Assessment & Plan Note (Signed)
The patient has had recurrent atrial flutter. I did not see any atrial fib but he is at risk for this. I have recommended he continue his amiodarone and undergo catheter ablation of atrial flutter prior to insertion of his ICD.

## 2014-04-03 NOTE — Progress Notes (Signed)
HPI Colton Lamb is referred today from the heart failure clinic for consideration of catheter ablation of atrial flutter and ICD implant. He is a pleasant 63 yo man with chronic class 2B CHF despite maximal medical therapy. He has had atrial flutter on 2 different occaisions requiring DCCV. He feels minimal palpitations. He denies chest pain but when he is out of rhythm his CHF worsens from class 2B to class 3B. He denies dietary or medical compliance problems. He has chronic peripheral edema. He is wearing a lifevest. Allergies  Allergen Reactions  . Iodine   . Shrimp [Shellfish Allergy]     SWELLING    HIVES    SHORTNESS OF BREATH  . Tetracycline      Current Outpatient Prescriptions  Medication Sig Dispense Refill  . amiodarone (PACERONE) 200 MG tablet Take 1 tablet (200 mg total) by mouth 2 (two) times daily.  60 tablet  3  . aspirin 81 MG tablet Take 81 mg by mouth daily.        Marland Kitchen atorvastatin (LIPITOR) 80 MG tablet TAKE 1 TABLET BY MOUTH EVERY DAY  30 tablet  3  . balsalazide (COLAZAL) 750 MG capsule Take 1 capsule (750 mg total) by mouth 3 (three) times daily.  90 capsule  11  . carvedilol (COREG) 12.5 MG tablet Take 1 tablet (12.5 mg total) by mouth 2 (two) times daily.  60 tablet  6  . furosemide (LASIX) 40 MG tablet Take 1 tablet (40 mg total) by mouth 2 (two) times daily.  60 tablet  6  . lisinopril (PRINIVIL,ZESTRIL) 20 MG tablet Take 1 tablet (20 mg total) by mouth daily.  30 tablet  6  . niacin (NIASPAN) 1000 MG CR tablet Take 1,000 mg by mouth at bedtime.      . nitroGLYCERIN (NITROSTAT) 0.4 MG SL tablet Place 0.4 mg under the tongue every 5 (five) minutes as needed. FOR CHEST PAIN      . omeprazole (PRILOSEC) 20 MG capsule Take 20 mg by mouth daily.        . potassium chloride SA (K-DUR,KLOR-CON) 20 MEQ tablet Take 1 tablet (20 mEq total) by mouth as directed. When you take Metolazone  10 tablet  3  . rivaroxaban (XARELTO) 20 MG TABS tablet Take 1 tablet (20 mg  total) by mouth daily with lunch.  30 tablet  11  . silver sulfADIAZINE (SILVADENE) 1 % cream Apply 1 application topically daily.  50 g  0  . metolazone (ZAROXOLYN) 5 MG tablet Take 5 mg by mouth as needed. FOR FLUID       No current facility-administered medications for this visit.     Past Medical History  Diagnosis Date  . Crohn's ileocolitis   . GERD (gastroesophageal reflux disease)   . Hx of adenomatous colonic polyps 11/2003  . Coronary artery disease     s/p stent placement  . MI (myocardial infarction) 2007  . Hypertension   . Obesity   . Atrial fibrillation   . Chronic systolic heart failure     a) EF 20-25%, grade III DD, RV mildly dilated and sys fx mild/mod reduced (10/2013)  . Ischemic cardiomyopathy     a) LHC (10/2013): LAD stent occlusion, duration unknown. QS pattern across precordium on EKG suggest not acute, moderate distal RCA obstruction and widely patent circumflex coronary artery  . Atrial flutter     a) s/p Cardioversion 11/22/13, on amiodarone and Xarelto  . Dysrhythmia  ATRIAL FIBRILATION  . Syncope 11/2013    in setting of volume depletion and bradycardia due to dig toxicity   . CHF (congestive heart failure)     ROS:   All systems reviewed and negative except as noted in the HPI.   Past Surgical History  Procedure Laterality Date  . 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 Cardiac catheterization  08/2006    drug-eluting stent   . Hydrocele excision      bilateral  . Foot surgery      left, bone spur  . Cardioversion N/A 03/05/2014    Procedure: CARDIOVERSION;  Surgeon: Jolaine Artist, MD;  Location: [facility] ENDOSCOPY;  Service: Cardiovascular;  Laterality: N/A;     Family History  Problem Relation Age of Onset  . Breast cancer Mother   . Heart disease Father   . Colon cancer Neg Hx   . Heart attack Father      History   Social History  . Marital Status: Widowed    Spouse Name: N/A    Number of  Children: 1  . Years of Education: N/A   Occupational History  . retired    Social History Main Topics  . Smoking status: Never Smoker   . Smokeless tobacco: Never Used  . Alcohol Use: No  . Drug Use: No  . Sexual Activity: Not on file   Other Topics Concern  . Not on file   Social History Narrative  . No narrative on file     BP 100/68  Pulse 76  Ht 5' 11"  (1.803 m)  Wt 297 lb (134.718 kg)  BMI 41.44 kg/m2  Physical Exam:  obese appearing middle aged man, NAD HEENT: Unremarkable Neck:  No JVD, no thyromegally Back:  No CVA tenderness Lungs:  Clear with no wheezes HEART:  Regular rate rhythm, no murmurs, no rubs, no clicks Abd:  soft, positive bowel sounds, no organomegally, no rebound, no guarding Ext:  2 plus pulses, no edema, no cyanosis, no clubbing Skin:  No rashes no nodules Neuro:  CN II through XII intact, motor grossly intact  EKG - nsr   Assess/Plan:

## 2014-04-03 NOTE — Patient Instructions (Signed)
Your physician recommends that you continue on your current medications as directed. Please refer to the Current Medication list given to you today. Your physician has recommended that you have an ablation. Catheter ablation is a medical procedure used to treat some cardiac arrhythmias (irregular heartbeats). During catheter ablation, a long, thin, flexible tube is put into a blood vessel in your groin (upper thigh), or neck. This tube is called an ablation catheter. It is then guided to your heart through the blood vessel. Radio frequency waves destroy small areas of heart tissue where abnormal heartbeats may cause an arrhythmia to start. Please see the instruction sheet given to you today.  Your physician has recommended that you have a defibrillator inserted. An implantable cardioverter defibrillator (ICD) is a small device that is placed in your chest or, in rare cases, your abdomen. This device uses electrical pulses or shocks to help control life-threatening, irregular heartbeats that could lead the heart to suddenly stop beating (sudden cardiac arrest). Leads are attached to the ICD that goes into your heart. This is done in the hospital and usually requires an overnight stay. Please see the instruction sheet given to you today for more information.  Cardioverter Defibrillator Implantation An implantable cardioverter defibrillator (ICD) is a small, lightweight, battery-powered device that is placed (implanted) under the skin in the chest or abdomen. Your caregiver may prescribe an ICD if:  You have had an irregular heart rhythm (arrhythmia) that originated in the lower chambers of the heart (ventricles).  Your heart has been damaged by a disease (such as coronary artery disease) or heart condition (such as a heart attack). An ICD consists of a battery that lasts several years, a small computer called a pulse generator, and wires called leads that go into the heart. It is used to detect and correct  two dangerous arrhythmias: a rapid heart rhythm (tachycardia) and an arrhythmia in which the ventricles contract in an uncoordinated way (fibrillation). When an ICD detects tachycardia, it sends an electrical signal to the heart that restores the heartbeat to normal (cardioversion). This signal is usually painless. If cardioversion does not work or if the ICD detects fibrillation, it delivers a small electrical shock to the heart (defibrillation) to restart the heart. The shock may feel like a strong jolt in the chest.ICDs may be programmed to correct other problems. Sometimes, ICDs are programmed to act as another type of implantable device called a pacemaker. Pacemakers are used to treat a slow heartbeat (bradycardia). LET YOUR CAREGIVER KNOW ABOUT:  Any allergies you have.  All medicines you are taking, including vitamins, herbs, eyedrops, and over-the-counter medicines and creams.  Previous problems you or members of your family have had with the use of anesthetics.  Any blood disorders you have had.  Other health problems you have. RISKS AND COMPLICATIONS Generally, the procedure to implant an ICD is safe. However, as with any surgical procedure, complications can occur. Possible complications associated with implanting an ICD include:  Swelling, bleeding, or bruising at the site where the ICD was implanted.  Infection at the site where the ICD was implanted.  A reaction to medicine used during the procedure.  Nerve, heart, or blood vessel damage.  Blood clots. BEFORE THE PROCEDURE  You may need to have blood tests, heart tests, or a chest X-ray done before the day of the procedure.  Ask your caregiver about changing or stopping your regular medicines.  Make plans to have someone drive you home. You may need to stay  in the hospital overnight after the procedure.  Stop smoking at least 24 hours before the procedure.  Take a bath or shower the night before the procedure. You may  need to scrub your chest or abdomen with a special type of soap.  Do not eat or drink before your procedure for as long as directed by your caregiver. Ask if it is okay to take any needed medicine with a small sip of water. PROCEDURE  The procedure to implant an ICD in your chest or abdomen is usually done at a hospital in a room that has a large X-ray machine called a fluoroscope. The machine will be above you during the procedure. It will help your caregiver see your heart during the procedure. Implanting an ICD usually takes 1-3 hours. Before the procedure:   Small monitors will be put on your body. They will be used to check your heart, blood pressure, and oxygen level.  A needle will be put into a vein in your hand or arm. This is called an intravenous (IV) access tube. Fluids and medicine will flow directly into your body through the IV tube.  Your chest or abdomen will be cleaned with a germ-killing (antiseptic) solution. The area may be shaved.  You may be given medicine to help you relax (sedative).  You will be given a medicine called a local anesthetic. This medicine will make the surgical site numb while the ICD is implanted. You will be sleepy but awake during the procedure. After you are numb the procedure will begin. The caregiver will:  Make a small cut (incision). This will make a pocket deep under your skin that will hold the pulse generator.  Guide the leads through a large blood vessel into your heart and attach them to the heart muscles. Depending on the ICD, the leads may go into one ventricle or they may go to both ventricles and into an upper chamber of the heart (atrium).  Test the ICD.  Close the incision with stitches, glue, or staples. AFTER THE PROCEDURE  You may feel pain. Some pain is normal. It may last a few days.  You may stay in a recovery area until the local anesthetic has worn off. Your blood pressure and pulse will be checked often. You will be taken  to a room where your heart will be monitored.  A chest X-ray will be taken. This is done to check that the cardioverter defibrillator is in the right place.  You may stay in the hospital overnight.  A slight bump may be seen over the skin where the ICD was placed. Sometimes, it is possible to feel the ICD under the skin. This is normal.  In the months and years afterward, your caregiver will check the device, the leads, and the battery every few months. Eventually, when the battery is low, the ICD will be replaced. Document Released: 04/09/2002 Document Revised: 05/08/2013 Document Reviewed: 08/06/2012 [facility] Patient Information 2015 [place], [place]. This information is not intended to replace advice given to you by your health care provider. Make sure you discuss any questions you have with your health care provider. Cardiac Ablation Cardiac ablation is a procedure to disable a small amount of heart tissue in very specific places. The heart has many electrical connections. Sometimes these connections are abnormal and can cause the heart to beat very fast or irregularly. By disabling some of the problem areas, heart rhythm can be improved or made normal. Ablation is done for people  who:   Have Wolff-Parkinson-White syndrome.   Have other fast heart rhythms (tachycardia).   Have taken medicines for an abnormal heart rhythm (arrhythmia) that resulted in:   No success.   Side effects.   May have a high-risk heartbeat that could result in death.  LET [facility] CARE PROVIDER KNOW ABOUT:   Any allergies you have or any previous reactions you have had to X-ray dye, food (such as seafood), medicine, or tape.   All medicines you are taking, including vitamins, herbs, eye drops, creams, and over-the-counter medicines.   Previous problems you or members of your family have had with the use of anesthetics.   Any blood disorders you have.   Previous surgeries or procedures (such  as a kidney transplant) you have had.   Medical conditions you have (such as kidney failure).  RISKS AND COMPLICATIONS Generally, cardiac ablation is a safe procedure. However, problems can occur and include:   Increased risk of cancer. Depending on how long it takes to do the ablation, the dose of radiation can be high.  Bruising and bleeding where a thin, flexible tube (catheter) was inserted during the procedure.   Bleeding into the chest, especially into the sac that surrounds the heart (serious).  Need for a permanent pacemaker if the normal electrical system is damaged.   The procedure may not be fully effective, and this may not be recognized for months. Repeat ablation procedures are sometimes required. BEFORE THE PROCEDURE   Follow any instructions from your health care provider regarding eating and drinking before the procedure.   Take your medicines as directed at regular times with water, unless instructed otherwise by your health care provider. If you are taking diabetes medicine, including insulin, ask how you are to take it and if there are any special instructions you should follow. It is common to adjust insulin dosing the day of the ablation.  PROCEDURE  An ablation is usually performed in a catheterization laboratory with the guidance of fluoroscopy. Fluoroscopy is a type of X-ray that helps your health care provider see images of your heart during the procedure.   An ablation is a minimally invasive procedure. This means a small cut (incision) is made in either your neck or groin. Your health care provider will decide where to make the incision based on your medical history and physical exam.  An IV tube will be started before the procedure begins. You will be given an anesthetic or medicine to help you relax (sedative).  The skin on your neck or groin will be numbed. A needle will be inserted into a large vein in your neck or groin and catheters will be  threaded to your heart.  A special dye that shows up on fluoroscopy pictures may be injected through the catheter. The dye helps your health care provider see the area of the heart that needs treatment.  The catheter has electrodes on the tip. When the area of heart tissue that is causing the arrhythmia is found, the catheter tip will send an electrical current to the area and "scar" the tissue. Three types of energy can be used to ablate the heart tissue:   Heat (radiofrequency energy).   Laser energy.   Extreme cold (cryoablation).   When the area of the heart has been ablated, the catheter will be taken out. Pressure will be held on the insertion site. This will help the insertion site clot and keep it from bleeding. A bandage will be placed  on the insertion site.  AFTER THE PROCEDURE   After the procedure, you will be taken to a recovery area where your vital signs (blood pressure, heart rate, and breathing) will be monitored. The insertion site will also be monitored for bleeding.   You will need to lie still for 4-6 hours. This is to ensure you do not bleed from the catheter insertion site.  Document Released: 12/04/2008 Document Revised: 12/02/2013 Document Reviewed: 12/10/2012 [facility] Patient Information 2015 [place], [place]. This information is not intended to replace advice given to you by your health care provider. Make sure you discuss any questions you have with your health care provider.

## 2014-04-03 NOTE — Assessment & Plan Note (Signed)
He denies anginal symptoms. He is on maximal medical therapy.

## 2014-04-04 ENCOUNTER — Other Ambulatory Visit: Payer: Self-pay | Admitting: *Deleted

## 2014-04-10 ENCOUNTER — Ambulatory Visit (HOSPITAL_COMMUNITY)
Admission: RE | Admit: 2014-04-10 | Discharge: 2014-04-10 | Disposition: A | Discharge status: Home / Self Care | Payer: BC Managed Care – PPO | Source: Ambulatory Visit | Attending: Cardiology | Admitting: Cardiology

## 2014-04-10 ENCOUNTER — Other Ambulatory Visit (INDEPENDENT_AMBULATORY_CARE_PROVIDER_SITE_OTHER): Payer: BC Managed Care – PPO

## 2014-04-10 VITALS — BP 90/62 | HR 75 | Wt 293.5 lb

## 2014-04-10 DIAGNOSIS — I483 Typical atrial flutter: Secondary | ICD-10-CM

## 2014-04-10 DIAGNOSIS — K219 Gastro-esophageal reflux disease without esophagitis: Secondary | ICD-10-CM | POA: Insufficient documentation

## 2014-04-10 DIAGNOSIS — I5022 Chronic systolic (congestive) heart failure: Secondary | ICD-10-CM | POA: Diagnosis present

## 2014-04-10 DIAGNOSIS — I509 Heart failure, unspecified: Secondary | ICD-10-CM | POA: Insufficient documentation

## 2014-04-10 DIAGNOSIS — Z860100 Personal history of colon polyps, unspecified: Secondary | ICD-10-CM | POA: Insufficient documentation

## 2014-04-10 DIAGNOSIS — Z7982 Long term (current) use of aspirin: Secondary | ICD-10-CM | POA: Insufficient documentation

## 2014-04-10 DIAGNOSIS — Z01812 Encounter for preprocedural laboratory examination: Secondary | ICD-10-CM

## 2014-04-10 DIAGNOSIS — I5041 Acute combined systolic (congestive) and diastolic (congestive) heart failure: Secondary | ICD-10-CM

## 2014-04-10 DIAGNOSIS — E785 Hyperlipidemia, unspecified: Secondary | ICD-10-CM | POA: Insufficient documentation

## 2014-04-10 DIAGNOSIS — Z9861 Coronary angioplasty status: Secondary | ICD-10-CM | POA: Diagnosis not present

## 2014-04-10 DIAGNOSIS — E669 Obesity, unspecified: Secondary | ICD-10-CM | POA: Diagnosis not present

## 2014-04-10 DIAGNOSIS — I4892 Unspecified atrial flutter: Secondary | ICD-10-CM

## 2014-04-10 DIAGNOSIS — I255 Ischemic cardiomyopathy: Secondary | ICD-10-CM

## 2014-04-10 DIAGNOSIS — I2589 Other forms of chronic ischemic heart disease: Secondary | ICD-10-CM | POA: Diagnosis not present

## 2014-04-10 DIAGNOSIS — I4891 Unspecified atrial fibrillation: Secondary | ICD-10-CM | POA: Diagnosis not present

## 2014-04-10 DIAGNOSIS — I2510 Atherosclerotic heart disease of native coronary artery without angina pectoris: Secondary | ICD-10-CM | POA: Diagnosis not present

## 2014-04-10 LAB — CBC WITH DIFFERENTIAL/PLATELET
BASOS ABS: 0.1 10*3/uL (ref 0.0–0.1)
Basophils Relative: 0.6 % (ref 0.0–3.0)
Eosinophils Absolute: 0.2 10*3/uL (ref 0.0–0.7)
Eosinophils Relative: 2.2 % (ref 0.0–5.0)
HCT: 43.2 % (ref 39.0–52.0)
Hemoglobin: 14.4 g/dL (ref 13.0–17.0)
LYMPHS ABS: 2.2 10*3/uL (ref 0.7–4.0)
Lymphocytes Relative: 20.2 % (ref 12.0–46.0)
MCHC: 33.4 g/dL (ref 30.0–36.0)
MCV: 83.9 fl (ref 78.0–100.0)
Monocytes Absolute: 0.7 10*3/uL (ref 0.1–1.0)
Monocytes Relative: 6.6 % (ref 3.0–12.0)
NEUTROS PCT: 70.4 % (ref 43.0–77.0)
Neutro Abs: 7.5 10*3/uL (ref 1.4–7.7)
PLATELETS: 292 10*3/uL (ref 150.0–400.0)
RBC: 5.16 Mil/uL (ref 4.22–5.81)
RDW: 15.4 % (ref 11.5–15.5)
WBC: 10.7 10*3/uL — ABNORMAL HIGH (ref 4.0–10.5)

## 2014-04-10 LAB — BASIC METABOLIC PANEL
BUN: 19 mg/dL (ref 6–23)
CALCIUM: 9.5 mg/dL (ref 8.4–10.5)
CO2: 28 meq/L (ref 19–32)
Chloride: 96 mEq/L (ref 96–112)
Creatinine, Ser: 1.2 mg/dL (ref 0.4–1.5)
GFR: 64.98 mL/min (ref >60.00)
Glucose, Bld: 328 mg/dL — ABNORMAL HIGH (ref 70–99)
POTASSIUM: 4.3 meq/L (ref 3.5–5.1)
SODIUM: 134 meq/L — AB (ref 135–145)

## 2014-04-10 LAB — PROTIME-INR
INR: 1.1 ratio — AB (ref 0.8–1.0)
Prothrombin Time: 12.7 s (ref 9.6–13.1)

## 2014-04-10 NOTE — Progress Notes (Signed)
Patient ID: Colton Lamb, male   DOB: 27-Jul-1951, 63 y.o.   MRN: [id number] PCP: Dr. Harle Stanford Navos) Primary Cardiologist: Dr. Rockey Situ  HPI:  Mr. Pheasant is a 63 year old gentleman with a history of paroxysmal atrial fibrillation, hyperlipidemia, coronary artery disease and chronic systolic HF EF 29-92%.  Presented to the hospital with NSTEMI 4/24- 12/01/13 He was found to be in A-flutter and was started on cardizem and heparin gtt. Pertinent labs on admission Troponin 17, Hgb A1C 7.9, K+ 4.5 and Cr 1.16. Underwent cath (results below) and DC-CV which was successful. Diuresed with IV lasix and discharge weight 289 lbs. He has some NSVT in the hospital and LifeVest was placed. Treated medically as LAD occlusion thought to be chronic with diffuse Qs across precordium   LHC/ LEFT VENTRICULOGRAM  -Aortic pressure 120/93, LV pressur 126/44, LVEDP 46  -L main widely patent  -LAD totally occluded promially within previously placed stent  -LCx widely patent  -RCA co-dominant; distal RCA leading into PDA contains segmental 70% stenosis  - Dilated LV and and severe dysfunction, estimate LVEF 20% or less, possible LV thrombus   ECHO (10/2013): EF 20-25%, diff HK, grade III DD, RV mildly dilated and sys fx mild/mod reduced. ECHO 01/29/2014: Poor windows. EF ~25% RV mild to mod reduced  DC-CV of AFL on 03/05/14  Follow-up: In early August weight up about 15 pounds in setting of recurrent AFL. Underwent DC-CV on 03/05/14. Feels better. Maintaining weight at 293Denies dyspnea, orthopnea or PND. Can walk around store without problem. No edema. No firing of LifeVest. Taking lasix 54m bid. No need for extra. No bleeding with Xarelto. Has seen Dr. TLovena Lepending ICD and ablation of AFL 9/16. At last visit carvedilol increased to 12.5 bid. BP low but no dizziness.    ROS: All systems negative except as listed in HPI, PMH and Problem List.  Past Medical History  Diagnosis Date  . Crohn's ileocolitis    . GERD (gastroesophageal reflux disease)   . Hx of adenomatous colonic polyps 11/2003  . Coronary artery disease     s/p stent placement  . MI (myocardial infarction) 2007  . Hypertension   . Obesity   . Atrial fibrillation   . Chronic systolic heart failure     a) EF 20-25%, grade III DD, RV mildly dilated and sys fx mild/mod reduced (10/2013)  . Ischemic cardiomyopathy     a) LHC (10/2013): LAD stent occlusion, duration unknown. QS pattern across precordium on EKG suggest not acute, moderate distal RCA obstruction and widely patent circumflex coronary artery  . Atrial flutter     a) s/p Cardioversion 11/22/13, on amiodarone and Xarelto  . Dysrhythmia     ATRIAL FIBRILATION  . Syncope 11/2013    in setting of volume depletion and bradycardia due to dig toxicity   . CHF (congestive heart failure)     Current Outpatient Prescriptions  Medication Sig Dispense Refill  . amiodarone (PACERONE) 200 MG tablet Take 1 tablet (200 mg total) by mouth 2 (two) times daily.  60 tablet  3  . aspirin 81 MG tablet Take 81 mg by mouth daily.        .Marland Kitchenatorvastatin (LIPITOR) 80 MG tablet TAKE 1 TABLET BY MOUTH EVERY DAY  30 tablet  3  . balsalazide (COLAZAL) 750 MG capsule Take 1 capsule (750 mg total) by mouth 3 (three) times daily.  90 capsule  11  . carvedilol (COREG) 12.5 MG tablet Take 1 tablet (12.5 mg total)  by mouth 2 (two) times daily.  60 tablet  6  . furosemide (LASIX) 40 MG tablet Take 1 tablet (40 mg total) by mouth 2 (two) times daily.  60 tablet  6  . lisinopril (PRINIVIL,ZESTRIL) 20 MG tablet Take 1 tablet (20 mg total) by mouth daily.  30 tablet  6  . niacin (NIASPAN) 1000 MG CR tablet Take 1,000 mg by mouth at bedtime.      . nitroGLYCERIN (NITROSTAT) 0.4 MG SL tablet Place 0.4 mg under the tongue every 5 (five) minutes as needed. FOR CHEST PAIN      . omeprazole (PRILOSEC) 20 MG capsule Take 20 mg by mouth daily.        . potassium chloride SA (K-DUR,KLOR-CON) 20 MEQ tablet Take 1 tablet  (20 mEq total) by mouth as directed. When you take Metolazone  10 tablet  3  . rivaroxaban (XARELTO) 20 MG TABS tablet Take 1 tablet (20 mg total) by mouth daily with lunch.  30 tablet  11  . silver sulfADIAZINE (SILVADENE) 1 % cream Apply 1 application topically daily.  50 g  0   No current facility-administered medications for this encounter.    Filed Vitals:   04/10/14 0953  BP: 90/62  Pulse: 75  Weight: 293 lb 8 oz (133.131 kg)  SpO2: 98%   PHYSICAL EXAM: General: Well appearing. HEENT: normal  Neck: supple. JVP difficult to assess d/t body habitus ; Carotids 2+ bilat; no bruits. No lymphadenopathy or thryomegaly appreciated.  Cor: PMI nondisplaced. Distant heart sounds. Irregular rate & rhythm. No rubs, gallops or murmurs. LifeVest intact Lungs: clear  Abdomen: soft, nontender, + distended. No hepatosplenomegaly. No bruits or masses. Good bowel sounds.  Extremities: no cyanosis, clubbing, rash, 2+ ankle edema Neuro: alert & orientedx3, cranial nerves grossly intact. moves all 4 extremities w/o difficulty. Affect pleasant  ECG NSR 77  ASSESSMENT & PLAN:  1) Chronic systolic HF: ICM, EF 63-89%, grade III DD, RV mildly dilated and sys fx mild/mod reduced (10/2013). Echo 7/15 EF 30-35% - Volume status much improved with restoration of NSR. NYHA II - Doing well on carvedilol to 12.5 bid and lisinopril 20 daily. BP too low to titrate.  - Scheduled for ICD next week - Reinforced the need and importance of daily weights, a low sodium diet, and fluid restriction (less than 2 L a day). Instructed to call the HF clinic if weight increases more than 3 lbs overnight or 5 lbs in a week.  2) AFL  - Back in NSR aft DC-CV. - On amio 200 daily. Continue Xarelto - 9/16 with have ICD and AFL ablation with Dr. Lovena Le  3) HTN - stable.  4) CAD - occluded LAD stent unknown duration. No PCI performed on 11/22/13. Continue medical management  - Continue aspirin 81 and statin. May be able to d/c  asa soon given Xarelto but would await at least 6 months after NSTEMI 5) OSA - Pending sleep study 05/06/14   Glori Bickers MD  10:09 AM

## 2014-04-10 NOTE — Patient Instructions (Signed)
We will contact you in 4 months to schedule your next appointment.  

## 2014-04-10 NOTE — Addendum Note (Signed)
Encounter addended by: Scarlette Calico, RN on: 04/10/2014 10:17 AM<BR>     Documentation filed: Patient Instructions Section

## 2014-04-11 NOTE — Addendum Note (Signed)
Encounter addended by: Evalee Mutton, CCT on: 04/11/2014  9:02 AM<BR>     Documentation filed: Charges VN

## 2014-04-15 DIAGNOSIS — I2510 Atherosclerotic heart disease of native coronary artery without angina pectoris: Secondary | ICD-10-CM | POA: Diagnosis not present

## 2014-04-15 DIAGNOSIS — Z7901 Long term (current) use of anticoagulants: Secondary | ICD-10-CM | POA: Diagnosis not present

## 2014-04-15 DIAGNOSIS — E669 Obesity, unspecified: Secondary | ICD-10-CM | POA: Diagnosis not present

## 2014-04-15 DIAGNOSIS — Z6841 Body Mass Index (BMI) 40.0 and over, adult: Secondary | ICD-10-CM | POA: Diagnosis not present

## 2014-04-15 DIAGNOSIS — I4892 Unspecified atrial flutter: Secondary | ICD-10-CM | POA: Diagnosis not present

## 2014-04-15 DIAGNOSIS — Z9861 Coronary angioplasty status: Secondary | ICD-10-CM | POA: Diagnosis not present

## 2014-04-15 DIAGNOSIS — I509 Heart failure, unspecified: Secondary | ICD-10-CM | POA: Diagnosis not present

## 2014-04-15 DIAGNOSIS — Z7982 Long term (current) use of aspirin: Secondary | ICD-10-CM | POA: Diagnosis not present

## 2014-04-15 DIAGNOSIS — I252 Old myocardial infarction: Secondary | ICD-10-CM | POA: Diagnosis not present

## 2014-04-15 DIAGNOSIS — K5080 Crohn's disease of both small and large intestine without complications: Secondary | ICD-10-CM | POA: Diagnosis not present

## 2014-04-15 DIAGNOSIS — I5022 Chronic systolic (congestive) heart failure: Secondary | ICD-10-CM | POA: Diagnosis present

## 2014-04-15 DIAGNOSIS — K219 Gastro-esophageal reflux disease without esophagitis: Secondary | ICD-10-CM | POA: Diagnosis not present

## 2014-04-15 DIAGNOSIS — Z23 Encounter for immunization: Secondary | ICD-10-CM | POA: Diagnosis not present

## 2014-04-15 DIAGNOSIS — I10 Essential (primary) hypertension: Secondary | ICD-10-CM | POA: Diagnosis not present

## 2014-04-15 DIAGNOSIS — I2589 Other forms of chronic ischemic heart disease: Secondary | ICD-10-CM | POA: Diagnosis not present

## 2014-04-15 DIAGNOSIS — Z86010 Personal history of colonic polyps: Secondary | ICD-10-CM | POA: Diagnosis not present

## 2014-04-15 DIAGNOSIS — I4891 Unspecified atrial fibrillation: Secondary | ICD-10-CM | POA: Diagnosis not present

## 2014-04-15 MED ORDER — SODIUM CHLORIDE 0.9 % IR SOLN
80.0000 mg | Status: DC
  Filled 2014-04-15: qty 2

## 2014-04-15 MED ORDER — DEXTROSE 5 % IV SOLN
3.0000 g | INTRAVENOUS | Status: DC
  Filled 2014-04-15: qty 3000

## 2014-04-16 ENCOUNTER — Encounter (HOSPITAL_COMMUNITY)
Admission: RE | Disposition: A | Discharge status: Home / Self Care | Payer: Self-pay | Source: Ambulatory Visit | Attending: Internal Medicine

## 2014-04-16 ENCOUNTER — Encounter (HOSPITAL_COMMUNITY): Payer: Self-pay | Admitting: General Practice

## 2014-04-16 ENCOUNTER — Ambulatory Visit (HOSPITAL_COMMUNITY)
Admission: RE | Admit: 2014-04-16 | Discharge: 2014-04-17 | Disposition: A | Discharge status: Home / Self Care | Payer: BC Managed Care – PPO | Source: Ambulatory Visit | Attending: Internal Medicine | Admitting: Internal Medicine

## 2014-04-16 ENCOUNTER — Encounter (HOSPITAL_COMMUNITY): Payer: Self-pay | Admitting: Pharmacy Technician

## 2014-04-16 DIAGNOSIS — I4892 Unspecified atrial flutter: Secondary | ICD-10-CM

## 2014-04-16 DIAGNOSIS — Z860100 Personal history of colon polyps, unspecified: Secondary | ICD-10-CM | POA: Insufficient documentation

## 2014-04-16 DIAGNOSIS — I2510 Atherosclerotic heart disease of native coronary artery without angina pectoris: Secondary | ICD-10-CM | POA: Insufficient documentation

## 2014-04-16 DIAGNOSIS — Z6841 Body Mass Index (BMI) 40.0 and over, adult: Secondary | ICD-10-CM | POA: Insufficient documentation

## 2014-04-16 DIAGNOSIS — I255 Ischemic cardiomyopathy: Secondary | ICD-10-CM

## 2014-04-16 DIAGNOSIS — Z7901 Long term (current) use of anticoagulants: Secondary | ICD-10-CM | POA: Insufficient documentation

## 2014-04-16 DIAGNOSIS — I252 Old myocardial infarction: Secondary | ICD-10-CM | POA: Insufficient documentation

## 2014-04-16 DIAGNOSIS — Z7982 Long term (current) use of aspirin: Secondary | ICD-10-CM | POA: Insufficient documentation

## 2014-04-16 DIAGNOSIS — I2589 Other forms of chronic ischemic heart disease: Secondary | ICD-10-CM

## 2014-04-16 DIAGNOSIS — I10 Essential (primary) hypertension: Secondary | ICD-10-CM | POA: Insufficient documentation

## 2014-04-16 DIAGNOSIS — I5022 Chronic systolic (congestive) heart failure: Secondary | ICD-10-CM

## 2014-04-16 DIAGNOSIS — Z23 Encounter for immunization: Secondary | ICD-10-CM | POA: Insufficient documentation

## 2014-04-16 DIAGNOSIS — Z9861 Coronary angioplasty status: Secondary | ICD-10-CM | POA: Insufficient documentation

## 2014-04-16 DIAGNOSIS — I4891 Unspecified atrial fibrillation: Secondary | ICD-10-CM | POA: Insufficient documentation

## 2014-04-16 DIAGNOSIS — K219 Gastro-esophageal reflux disease without esophagitis: Secondary | ICD-10-CM | POA: Insufficient documentation

## 2014-04-16 DIAGNOSIS — I509 Heart failure, unspecified: Secondary | ICD-10-CM | POA: Insufficient documentation

## 2014-04-16 DIAGNOSIS — I5023 Acute on chronic systolic (congestive) heart failure: Secondary | ICD-10-CM | POA: Diagnosis present

## 2014-04-16 DIAGNOSIS — K5080 Crohn's disease of both small and large intestine without complications: Secondary | ICD-10-CM | POA: Insufficient documentation

## 2014-04-16 DIAGNOSIS — E669 Obesity, unspecified: Secondary | ICD-10-CM | POA: Insufficient documentation

## 2014-04-16 HISTORY — PX: CARDIAC ELECTROPHYSIOLOGY STUDY AND ABLATION: SHX1294

## 2014-04-16 HISTORY — DX: Sleep apnea, unspecified: G47.30

## 2014-04-16 HISTORY — PX: IMPLANTABLE CARDIOVERTER DEFIBRILLATOR IMPLANT: SHX5473

## 2014-04-16 HISTORY — PX: CARDIAC DEFIBRILLATOR PLACEMENT: SHX171

## 2014-04-16 HISTORY — PX: ATRIAL FLUTTER ABLATION: SHX5733

## 2014-04-16 LAB — SURGICAL PCR SCREEN
MRSA, PCR: NEGATIVE
Staphylococcus aureus: NEGATIVE

## 2014-04-16 LAB — GLUCOSE, CAPILLARY
GLUCOSE-CAPILLARY: 288 mg/dL — AB (ref 70–99)
Glucose-Capillary: 225 mg/dL — ABNORMAL HIGH (ref 70–99)

## 2014-04-16 SURGERY — ATRIAL FLUTTER ABLATION
Anesthesia: LOCAL

## 2014-04-16 MED ORDER — LIDOCAINE HCL (PF) 1 % IJ SOLN
INTRAMUSCULAR | Status: AC
  Filled 2014-04-16: qty 30

## 2014-04-16 MED ORDER — PANTOPRAZOLE SODIUM 40 MG PO TBEC
40.0000 mg | DELAYED_RELEASE_TABLET | Freq: Every day | ORAL | Status: DC
  Administered 2014-04-16 – 2014-04-17 (×2): 40 mg via ORAL
  Filled 2014-04-16 (×2): qty 1

## 2014-04-16 MED ORDER — LIDOCAINE HCL (PF) 1 % IJ SOLN
INTRAMUSCULAR | Status: AC
Start: 2014-04-16 — End: 2014-04-16
  Filled 2014-04-16: qty 30

## 2014-04-16 MED ORDER — LIVING WELL WITH DIABETES BOOK
Freq: Once | Status: AC
  Administered 2014-04-16: 23:00:00
  Filled 2014-04-16: qty 1

## 2014-04-16 MED ORDER — MIDAZOLAM HCL 5 MG/5ML IJ SOLN
INTRAMUSCULAR | Status: AC
  Filled 2014-04-16: qty 5

## 2014-04-16 MED ORDER — INFLUENZA VAC SPLIT QUAD 0.5 ML IM SUSY
0.5000 mL | PREFILLED_SYRINGE | INTRAMUSCULAR | Status: AC
  Administered 2014-04-17: 10:00:00 0.5 mL via INTRAMUSCULAR
  Filled 2014-04-16: qty 0.5

## 2014-04-16 MED ORDER — AMIODARONE HCL 200 MG PO TABS
200.0000 mg | ORAL_TABLET | Freq: Two times a day (BID) | ORAL | Status: DC
  Administered 2014-04-16 – 2014-04-17 (×2): 200 mg via ORAL
  Filled 2014-04-16 (×3): qty 1

## 2014-04-16 MED ORDER — CEFAZOLIN SODIUM-DEXTROSE 2-3 GM-% IV SOLR
2.0000 g | Freq: Four times a day (QID) | INTRAVENOUS | Status: AC
  Administered 2014-04-16 – 2014-04-17 (×3): 2 g via INTRAVENOUS
  Filled 2014-04-16 (×3): qty 50

## 2014-04-16 MED ORDER — BUPIVACAINE HCL (PF) 0.25 % IJ SOLN
INTRAMUSCULAR | Status: AC
  Filled 2014-04-16: qty 30

## 2014-04-16 MED ORDER — LISINOPRIL 20 MG PO TABS
20.0000 mg | ORAL_TABLET | Freq: Every day | ORAL | Status: DC
  Administered 2014-04-17: 09:00:00 20 mg via ORAL
  Filled 2014-04-16 (×2): qty 1

## 2014-04-16 MED ORDER — MUPIROCIN 2 % EX OINT
TOPICAL_OINTMENT | CUTANEOUS | Status: AC
  Administered 2014-04-16: 1 via TOPICAL
  Filled 2014-04-16: qty 22

## 2014-04-16 MED ORDER — FUROSEMIDE 40 MG PO TABS
40.0000 mg | ORAL_TABLET | Freq: Two times a day (BID) | ORAL | Status: DC
  Filled 2014-04-16 (×2): qty 1

## 2014-04-16 MED ORDER — ACETAMINOPHEN 325 MG PO TABS: 325.0000 mg | ORAL_TABLET | ORAL | Status: DC | PRN

## 2014-04-16 MED ORDER — LISINOPRIL 20 MG PO TABS
20.0000 mg | ORAL_TABLET | Freq: Every day | ORAL | Status: DC
  Filled 2014-04-16: qty 1

## 2014-04-16 MED ORDER — CARVEDILOL 12.5 MG PO TABS
12.5000 mg | ORAL_TABLET | Freq: Two times a day (BID) | ORAL | Status: DC
  Administered 2014-04-16 – 2014-04-17 (×2): 12.5 mg via ORAL
  Filled 2014-04-16 (×2): qty 1

## 2014-04-16 MED ORDER — ONDANSETRON HCL 4 MG/2ML IJ SOLN: 4.0000 mg | Freq: Four times a day (QID) | INTRAMUSCULAR | Status: DC | PRN

## 2014-04-16 MED ORDER — AMIODARONE HCL 200 MG PO TABS: 200.0000 mg | ORAL_TABLET | Freq: Two times a day (BID) | ORAL | Status: DC

## 2014-04-16 MED ORDER — FENTANYL CITRATE 0.05 MG/ML IJ SOLN
INTRAMUSCULAR | Status: AC
  Filled 2014-04-16: qty 2

## 2014-04-16 MED ORDER — FUROSEMIDE 40 MG PO TABS
40.0000 mg | ORAL_TABLET | Freq: Two times a day (BID) | ORAL | Status: DC
  Administered 2014-04-16 – 2014-04-17 (×2): 40 mg via ORAL
  Filled 2014-04-16 (×4): qty 1

## 2014-04-16 MED ORDER — RIVAROXABAN 20 MG PO TABS
20.0000 mg | ORAL_TABLET | Freq: Every day | ORAL | Status: DC
  Filled 2014-04-16: qty 1

## 2014-04-16 MED ORDER — ATORVASTATIN CALCIUM 80 MG PO TABS
80.0000 mg | ORAL_TABLET | Freq: Every day | ORAL | Status: DC
  Administered 2014-04-16: 18:00:00 80 mg via ORAL
  Filled 2014-04-16 (×2): qty 1

## 2014-04-16 MED ORDER — MUPIROCIN 2 % EX OINT
1.0000 "application " | TOPICAL_OINTMENT | Freq: Once | CUTANEOUS | Status: AC
  Administered 2014-04-16: 1 via TOPICAL

## 2014-04-16 MED ORDER — HEPARIN (PORCINE) IN NACL 2-0.9 UNIT/ML-% IJ SOLN
INTRAMUSCULAR | Status: AC
  Filled 2014-04-16: qty 500

## 2014-04-16 MED ORDER — SODIUM CHLORIDE 0.9 % IV SOLN
INTRAVENOUS | Status: DC
  Administered 2014-04-16: 11:00:00 via INTRAVENOUS

## 2014-04-16 MED ORDER — CARVEDILOL 12.5 MG PO TABS: 12.5000 mg | ORAL_TABLET | Freq: Two times a day (BID) | ORAL | Status: DC

## 2014-04-16 MED ORDER — INSULIN ASPART 100 UNIT/ML ~~LOC~~ SOLN
0.0000 [IU] | Freq: Three times a day (TID) | SUBCUTANEOUS | Status: DC
  Administered 2014-04-17: 3 [IU] via SUBCUTANEOUS

## 2014-04-16 MED ORDER — NYSTATIN 100000 UNIT/GM EX POWD
Freq: Two times a day (BID) | CUTANEOUS | Status: DC
  Administered 2014-04-16 – 2014-04-17 (×2): via TOPICAL
  Filled 2014-04-16 (×2): qty 15

## 2014-04-16 MED ORDER — INSULIN ASPART 100 UNIT/ML ~~LOC~~ SOLN
0.0000 [IU] | Freq: Every day | SUBCUTANEOUS | Status: DC
  Administered 2014-04-16: 23:00:00 3 [IU] via SUBCUTANEOUS

## 2014-04-16 MED ORDER — YOU HAVE A PACEMAKER BOOK
Freq: Once | Status: AC
  Administered 2014-04-16: 22:00:00
  Filled 2014-04-16: qty 1

## 2014-04-16 MED ORDER — CHLORHEXIDINE GLUCONATE 4 % EX LIQD: 60.0000 mL | Freq: Once | CUTANEOUS | Status: DC

## 2014-04-16 NOTE — H&P (View-Only) (Signed)
HPI Colton Lamb is referred today from the heart failure clinic for consideration of catheter ablation of atrial flutter and ICD implant. He is a pleasant 63 yo man with chronic class 2B CHF despite maximal medical therapy. He has had atrial flutter on 2 different occaisions requiring DCCV. He feels minimal palpitations. He denies chest pain but when he is out of rhythm his CHF worsens from class 2B to class 3B. He denies dietary or medical compliance problems. He has chronic peripheral edema. He is wearing a lifevest. Allergies  Allergen Reactions  . Iodine   . Shrimp [Shellfish Allergy]     SWELLING    HIVES    SHORTNESS OF BREATH  . Tetracycline      Current Outpatient Prescriptions  Medication Sig Dispense Refill  . amiodarone (PACERONE) 200 MG tablet Take 1 tablet (200 mg total) by mouth 2 (two) times daily.  60 tablet  3  . aspirin 81 MG tablet Take 81 mg by mouth daily.        Marland Kitchen atorvastatin (LIPITOR) 80 MG tablet TAKE 1 TABLET BY MOUTH EVERY DAY  30 tablet  3  . balsalazide (COLAZAL) 750 MG capsule Take 1 capsule (750 mg total) by mouth 3 (three) times daily.  90 capsule  11  . carvedilol (COREG) 12.5 MG tablet Take 1 tablet (12.5 mg total) by mouth 2 (two) times daily.  60 tablet  6  . furosemide (LASIX) 40 MG tablet Take 1 tablet (40 mg total) by mouth 2 (two) times daily.  60 tablet  6  . lisinopril (PRINIVIL,ZESTRIL) 20 MG tablet Take 1 tablet (20 mg total) by mouth daily.  30 tablet  6  . niacin (NIASPAN) 1000 MG CR tablet Take 1,000 mg by mouth at bedtime.      . nitroGLYCERIN (NITROSTAT) 0.4 MG SL tablet Place 0.4 mg under the tongue every 5 (five) minutes as needed. FOR CHEST PAIN      . omeprazole (PRILOSEC) 20 MG capsule Take 20 mg by mouth daily.        . potassium chloride SA (K-DUR,KLOR-CON) 20 MEQ tablet Take 1 tablet (20 mEq total) by mouth as directed. When you take Metolazone  10 tablet  3  . rivaroxaban (XARELTO) 20 MG TABS tablet Take 1 tablet (20 mg  total) by mouth daily with lunch.  30 tablet  11  . silver sulfADIAZINE (SILVADENE) 1 % cream Apply 1 application topically daily.  50 g  0  . metolazone (ZAROXOLYN) 5 MG tablet Take 5 mg by mouth as needed. FOR FLUID       No current facility-administered medications for this visit.     Past Medical History  Diagnosis Date  . Crohn's ileocolitis   . GERD (gastroesophageal reflux disease)   . Hx of adenomatous colonic polyps 11/2003  . Coronary artery disease     s/p stent placement  . MI (myocardial infarction) 2007  . Hypertension   . Obesity   . Atrial fibrillation   . Chronic systolic heart failure     a) EF 20-25%, grade III DD, RV mildly dilated and sys fx mild/mod reduced (10/2013)  . Ischemic cardiomyopathy     a) LHC (10/2013): LAD stent occlusion, duration unknown. QS pattern across precordium on EKG suggest not acute, moderate distal RCA obstruction and widely patent circumflex coronary artery  . Atrial flutter     a) s/p Cardioversion 11/22/13, on amiodarone and Xarelto  . Dysrhythmia  ATRIAL FIBRILATION  . Syncope 11/2013    in setting of volume depletion and bradycardia due to dig toxicity   . CHF (congestive heart failure)     ROS:   All systems reviewed and negative except as noted in the HPI.   Past Surgical History  Procedure Laterality Date  . 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 Cardiac catheterization  08/2006    drug-eluting stent   . Hydrocele excision      bilateral  . Foot surgery      left, bone spur  . Cardioversion N/A 03/05/2014    Procedure: CARDIOVERSION;  Surgeon: Jolaine Artist, MD;  Location: [facility] ENDOSCOPY;  Service: Cardiovascular;  Laterality: N/A;     Family History  Problem Relation Age of Onset  . Breast cancer Mother   . Heart disease Father   . Colon cancer Neg Hx   . Heart attack Father      History   Social History  . Marital Status: Widowed    Spouse Name: N/A    Number of  Children: 1  . Years of Education: N/A   Occupational History  . retired    Social History Main Topics  . Smoking status: Never Smoker   . Smokeless tobacco: Never Used  . Alcohol Use: No  . Drug Use: No  . Sexual Activity: Not on file   Other Topics Concern  . Not on file   Social History Narrative  . No narrative on file     BP 100/68  Pulse 76  Ht 5' 11"  (1.803 m)  Wt 297 lb (134.718 kg)  BMI 41.44 kg/m2  Physical Exam:  obese appearing middle aged man, NAD HEENT: Unremarkable Neck:  No JVD, no thyromegally Back:  No CVA tenderness Lungs:  Clear with no wheezes HEART:  Regular rate rhythm, no murmurs, no rubs, no clicks Abd:  soft, positive bowel sounds, no organomegally, no rebound, no guarding Ext:  2 plus pulses, no edema, no cyanosis, no clubbing Skin:  No rashes no nodules Neuro:  CN II through XII intact, motor grossly intact  EKG - nsr   Assess/Plan:

## 2014-04-16 NOTE — Op Note (Signed)
SURGEON:  Cristopher Peru, MD      PREPROCEDURE DIAGNOSES:   1. Ischemic cardiomyopathy.   2. New York Heart Association class II, heart failure chronically, ejection fraction 25%     POSTPROCEDURE DIAGNOSES:   1. Ischemic cardiomyopathy.   2. New York Heart Association class II heart failure chronically, ejection fraction 25%      PROCEDURES:    1. ICD implantation.  2. Defibrillation threshold testing     INTRODUCTION:  Colton Lamb is a 63 y.o. male with an ischemic CM (EF 25%), NYHA Class II CHF, and CAD, s/p MI remotely. The patient also has a history of atrial flutter which developed after his chronic systolic heart failure and cardiomyopathy. It should be noted that the atrial flutter did not cause his cardiomyopathy. At this time, he meets MADIT II/ SCD-HeFT criteria for ICD implantation for primary prevention of sudden death.  The patient has a narrow QRS and does not meet criteria for revascularization.  The patient has been treated with an optimal medical regimen but continues to have a depressed ejection fraction and NYHA Class II CHF symptoms.  The patient therefore  presents today for ICD implantation.      DESCRIPTION OF PROCEDURE:  Informed written consent was obtained and the patient was brought to the electrophysiology lab in the fasting state. The patient was adequately sedated with intravenous Versed, and fentanyl as outlined in the nursing report.  The patient's left chest was prepped and draped in the usual sterile fashion by the EP lab staff.  The skin overlying the left deltopectoral region was infiltrated with lidocaine for local analgesia.  A 5-cm incision was made over the left deltopectoral region.  A left subcutaneous defibrillator pocket was fashioned using a combination of sharp and blunt dissection.  Electrocautery was used to assure hemostasis.    RA/RV Lead Placement: The left axillary vein was cannulated with fluoroscopic visualization.  No contrast was  required for this endeavor.  Through the left axillary vein, a Medtronic [id number]  (serial # [id number]  ) right atrial lead and a Medtronic [id number] (serial number [id number]) right ventricular defibrillator lead were advanced with fluoroscopic visualization into the right atrial appendage and right ventricular apical septal positions respectively.  Initial atrial lead P-waves measured 4 mV with an impedance of 532 ohms and a threshold of 0.8 volts at 0.5 milliseconds.  The right ventricular lead R-wave measured 8 mV with impedance of 437 ohms and a threshold of 0.5 volts at 0.5 milliseconds.   The leads were secured to the pectoralis  fascia using #2 silk suture over the suture sleeves.  The pocket then  irrigated with copious gentamicin solution.  The leads were then  connected to a medtronic Evera (serial  Number [id number]) ICD.  The defibrillator was placed into the  pocket.  The pocket was then closed in 2 layers with 2.0 Vicryl suture  for the subcutaneous and subcuticular layers.  Steri-Strips and a  sterile dressing were then applied.   DFT Testing: Defibrillation Threshold testing was then performed. Ventricular fibrillation was induced with a T shock.  Adequate sensing of ventricular fibrillation was observed with minimal dropout with a programmed sensitivity of 87m.  The patient was successfully defibrillated to sinus rhythm with a single 20  joules shock delivered from the device with an impedance of 89 ohms in a duration of 9 seconds.  The patient remained in sinus rhythm thereafter.  There were no early apparent complications. Her  CONCLUSIONS:   1. Ischemic cardiomyopathy with chronic New York Heart Association class II heart failure.   2. Successful ICD implantation.   3. DFT less than or equal to 20 joules.   4. No early apparent complications.   Cristopher Peru, MD  2:46 PM 04/16/2014

## 2014-04-16 NOTE — Progress Notes (Signed)
Site area: right groin Site Prior to Removal:  Level 0 Pressure Applied For: 15 minutes Manual:   yes Patient Status During Pull:  stable Post Pull Site:  Level 0 Post Pull Instructions Given:  yes Post Pull Pulses Present: yes Dressing Applied:  yes Bedrest begins @ 1510 Comments: no complications. Arm sling applied to lt arm

## 2014-04-16 NOTE — CV Procedure (Signed)
Electrophysiology procedure note  Procedure: Electrophysiologic study and catheter ablation of atrial flutter  Preoperative diagnosis: Long-standing ischemic cardiomyopathy, chronic systolic heart failure, ejection fraction 25-30% despite maximal medical therapy, with recent recurrent atrial flutter with a rapid ventricular response requiring cardioversion on 2 separate occasions  Postoperative diagnosis: Same as preoperative diagnosis  Description of procedure: After informed consent was obtained, the patient was taken to the diagnostic electrophysiology laboratory in the fasting state. After the usual preparation and draping, intravenous Versed and fentanyl were used for sedation. A 6 French quadripolar catheter was inserted percutaneously into the right femoral vein and advanced under fluoroscopic guidance to the his bundle region. A 7 French decapolar catheter was inserted percutaneously into the right femoral vein and advanced under fluoroscopic guidance to the coronary sinus. Next rapid atrial pacing was carried out from the atrium at a pacing cycle length of 600 ms, and stepwise decrease down to 410 ms or AV block was observed. During rapid atrial pacing, the PR interval was less than the RR interval and there is no inducible SVT initially. Next programmed atrial stimulation was carried out from the coronary sinus at a pacing cycle of 500 ms. The S1-S2 interval was stepwise decreased from 440 ms down to 240 ms where atrial refractoriness was observed. Next additional rapid atrial pacing was carried out from the coronary sinus at a pacing cycle and of 300 ms and stepwise decreased down to 210 ms, resulting in the initiation of atrial flutter. Mapping of atrial flutter was carried out. The cycle length was 270 ms. Mapping demonstrated typical counterclockwise tricuspid annular reentrant atrial flutter. Additional mapping demonstrated that the atrial flutter isthmus was of normal size and orientation. A  7 French quadripolar ablation catheter was inserted percutaneously into the right femoral vein and advanced under fluoroscopic guidance to the right atrium. For radiofrequency energy applications were delivered. Atrial flutter was terminated, and sinus rhythm was restored. 5 additional artery complications were then delivered. During the sixth radiofrequency energy application, atrial flutter isthmus block was demonstrated. 3 bone was radiofrequency energy applications were then delivered and the patient was observed for 20 minutes. During this time, rapid ventricular pacing was carried out from the right ventricle and demonstrated VA block at a pacing cycle length of 380 ms. Next programmed ventricular stimulation was carried out from the right ventricle at a pacing cycle length of 500 ms. the S1-S2 interval stepwise decreased down to 440 ms the retrograde AV node ERP was observed. During programmed ventricular stimulation, the atrial activation appeared to be midline and decremental, and there was no inducible SVT. At this point, the catheters were removed and the patient was prepared for insertion of an ICD. This will be dictated on a separate report.   Complications: There were no immediate procedure complications   Results.  1. Baseline ECG. The baseline ECG demonstrates sinus rhythm with normal axis and intervals.  2. Baseline intervals.  The sinus node cycle length was 854 ms. The QRS duration was 114 ms. The HV interval was 62 ms. The AH interval was 87 ms.  3. Rapid ventricular pacing. Rapid ventricular pacing was carried out from the right ventricle and demonstrated VA block at 380 ms. During rapid ventricular pacing, the atrial activation sequence was midline and decremental.  4. Program ventricular stimulation. Program ventricular stimulation was carried out from the right ventricle at a pacing cycle length of 500 ms. The S1-S2 interval stepwise decreased to 440 ms where the retrograde AV node  ERP was  observed. Care program ventricular stimulation, atrial activation sequence was midline and decremental.  5. Rapid atrial pacing. Rapid atrial pacing was carried out from the atrium at a pacing cycle of 600 milliseconds, and stepwise decreased down to 410 ms, where AV block was demonstrated.  Additional rapid atrial pacing was carried out down to 210 ms, resulting in the initiation of atrial flutter. Following catheter ablation, additional atrial pacing demonstrated no inducible atrial flutter.  6. Programmed atrial stimulation. Programmed atrial stimulation was carried out from the atrium at a pacing cycle of 500 ms. The S1-S2 interval stepwise decreased down to 240 ms where atrial refractoriness was observed. During programmed atrial stimulation, there were no AH Jumps and no echo beats and no inducible SVT.  7. Arrhythmias observed. Atrial flutter, initiation, rapid atrial pacing at 210 ms, duration sustained, cycle length 274 ms. Method of termination was with catheter ablation  8. Mapping. Mapping of atrial flutter isthmus demonstrated typical counterclockwise tricuspid annular reentrant atrial flutter.  9. Radiofrequency energy application. 9 radiofrequency energy applications were delivered. During the fourth radiofrequency energy application, atrial flutter was terminated, and sinus rhythm restored.   Conclusion: Successful electrophysiologic study and catheter ablation of typical atrial flutter with a total of 9 radiofrequency energy applications, resulting in termination of atrial flutter, restoration of sinus rhythm, and creation of block in the atrial flutter isthmus.   Cristopher Peru, M.D.

## 2014-04-16 NOTE — Interval H&P Note (Signed)
History and Physical Interval Note: The patient has a longstanding ischemic cardiomyopathy, s/p MI with an EF 30%. He also has recurrent atrial flutter which developed after his LV dysfunction requiring multiple DCCV's. No change in the history since last visit.  04/16/2014 11:29 AM  Colton Lamb  has presented today for surgery, with the diagnosis of flutter, ischemic cardiomyopathy  The various methods of treatment have been discussed with the patient and family. After consideration of risks, benefits and other options for treatment, the patient has consented to  Procedure(s): ATRIAL FLUTTER ABLATION (N/A) IMPLANTABLE CARDIOVERTER DEFIBRILLATOR IMPLANT (N/A) as a surgical intervention .  The patient's history has been reviewed, patient examined, no change in status, stable for surgery.  I have reviewed the patient's chart and labs.  Questions were answered to the patient's satisfaction.     Mikle Bosworth.D.

## 2014-04-17 ENCOUNTER — Encounter (HOSPITAL_COMMUNITY): Payer: Self-pay | Admitting: Cardiology

## 2014-04-17 ENCOUNTER — Ambulatory Visit (HOSPITAL_COMMUNITY): Payer: BC Managed Care – PPO

## 2014-04-17 DIAGNOSIS — I509 Heart failure, unspecified: Secondary | ICD-10-CM | POA: Diagnosis not present

## 2014-04-17 DIAGNOSIS — I4892 Unspecified atrial flutter: Secondary | ICD-10-CM | POA: Diagnosis not present

## 2014-04-17 DIAGNOSIS — I2589 Other forms of chronic ischemic heart disease: Secondary | ICD-10-CM

## 2014-04-17 DIAGNOSIS — I5022 Chronic systolic (congestive) heart failure: Secondary | ICD-10-CM | POA: Diagnosis not present

## 2014-04-17 LAB — GLUCOSE, CAPILLARY: GLUCOSE-CAPILLARY: 240 mg/dL — AB (ref 70–99)

## 2014-04-17 MED ORDER — ACETAMINOPHEN 325 MG PO TABS: 325.0000 mg | ORAL_TABLET | ORAL | Status: AC | PRN

## 2014-04-17 NOTE — Discharge Summary (Signed)
ELECTROPHYSIOLOGY PROCEDURE DISCHARGE SUMMARY    Patient ID: Colton Lamb,  MRN: [id number], DOB/AGE: May 20, 1951 63 y.o.  Admit date: 04/16/2014 Discharge date: 04/17/2014  Primary Care Physician: Mendel Ryder, MD Primary Cardiologist: Rockey Situ Electrophysiologist: Lovena Le  Primary Discharge Diagnosis:  Ischemic cardiomyopathy and atria flutter status post ablation and ICD implant this admission  Secondary Discharge Diagnosis:  1.  Hypertension 2.  Obesity 3.  Chronic systolic heart failure 4.  CAD - LHC (10/2013): LAD stent occlusion, duration unknown. QS pattern across precordium on EKG suggest not acute, moderate distal RCA obstruction and widely patent circumflex coronary artery    Allergies  Allergen Reactions  . Iodine   . Shrimp [Shellfish Allergy]     SWELLING    HIVES    SHORTNESS OF BREATH  . Tetracycline Rash     Procedures This Admission:  1.  Implantation of a MDT dual chamber ICD on 04-16-14 by Dr Lovena Le.  The patient received a MDT model number Evera ICD with model number 5076 right atrial lead, 6283 right ventricular lead.  DFT's were successful at 10 J.  There were no immediate post procedure complications. 2.  Electrophysiology study and radiofrequency catheter ablation on 04-16-14 by Dr Lovena Le.  This demonstrated typical atrial flutter.  Complete bidirectional isthmus block was achieved.  There were no early apparent complications.  2.  CXR on 04-17-14 demonstrated no pneumothorax status post device implantation.   Brief HPI: Colton Lamb is a 63 y.o. male was referred to electrophysiology in the outpatient setting for consideration of ICD implantation.  Past medical history includes ischemic cardiomyopathy and atrial flutter.  The patient has persistent LV dysfunction despite guideline directed therapy.  Risks, benefits, and alternatives to ICD implantation and flutter ablation were reviewed with the patient who wished to proceed.   Hospital Course:   The patient was admitted and underwent implantation of a MDT dual chamber ICD and ablation of atrial flutter with details as outlined above.   He was monitored on telemetry overnight which demonstrated sinus rhythm.  Left chest was without hematoma or ecchymosis. Groin was without complication.  The device was interrogated and found to be functioning normally.  CXR was obtained and demonstrated no pneumothorax status post device implantation.  Wound care, arm mobility, and restrictions were reviewed with the patient.  Dr Rayann Heman examined the patient and considered them stable for discharge to home.   The patient's discharge medications include an ACE-I (Lisinopril) and beta blocker (Coreg).   Discharge Vitals: Blood pressure 131/70, pulse 74, temperature 98.7 F (37.1 C), temperature source Oral, resp. rate 20, height 5' 11"  (1.803 m), weight 293 lb 14 oz (133.3 kg), SpO2 97.00%.  Physical Exam: Filed Vitals:   04/16/14 1700 04/16/14 1937 04/17/14 0035 04/17/14 0539  BP: 103/59 111/69 100/68 131/70  Pulse: 74 82 80 74  Temp: 98 F (36.7 C) 98.2 F (36.8 C) 98 F (36.7 C) 98.7 F (37.1 C)  TempSrc: Oral Oral Oral Oral  Resp: 20 16 18 20   Height:      Weight:   293 lb 14 oz (133.3 kg)   SpO2: 97% 95% 96% 97%    GEN- The patient is overweight appearing, alert and oriented x 3 today.   Head- normocephalic, atraumatic Eyes-  Sclera clear, conjunctiva pink Ears- hearing intact Oropharynx- clear Neck- supple, Lungs- Clear to ausculation bilaterally, normal work of breathing Heart- Regular rate and rhythm, no murmurs, rubs or gallops, PMI not laterally displaced GI- soft,  NT, ND, + BS Extremities- no clubbing, cyanosis, or edema, groin is without hematoma/ bruit MS- no significant deformity or atrophy Skin- ICD pocket is without hematoma Psych- euthymic mood, full affect Neuro- strength and sensation are intact   Labs:   Lab Results  Component Value Date   WBC 10.7* 04/10/2014    HGB 14.4 04/10/2014   HCT 43.2 04/10/2014   MCV 83.9 04/10/2014   PLT 292.0 04/10/2014     Recent Labs Lab 04/10/14 1058  NA 134*  K 4.3  CL 96  CO2 28  BUN 19  CREATININE 1.2  CALCIUM 9.5  GLUCOSE 328*    Discharge Medications:    Medication List    ASK your doctor about these medications       amiodarone 200 MG tablet  Commonly known as:  PACERONE  Take 200 mg by mouth 2 (two) times daily.     aspirin 81 MG tablet  Take 81 mg by mouth daily.     atorvastatin 80 MG tablet  Commonly known as:  LIPITOR  Take 80 mg by mouth daily.     balsalazide 750 MG capsule  Commonly known as:  COLAZAL  Take 750 mg by mouth 3 (three) times daily.     carvedilol 12.5 MG tablet  Commonly known as:  COREG  Take 12.5 mg by mouth 2 (two) times daily.     furosemide 40 MG tablet  Commonly known as:  LASIX  Take 40 mg by mouth 2 (two) times daily.     lisinopril 20 MG tablet  Commonly known as:  PRINIVIL,ZESTRIL  Take 20 mg by mouth daily.     niacin 1000 MG CR tablet  Commonly known as:  NIASPAN  Take 1,000 mg by mouth at bedtime.     nitroGLYCERIN 0.4 MG SL tablet  Commonly known as:  NITROSTAT  Place 0.4 mg under the tongue every 5 (five) minutes as needed. FOR CHEST PAIN     omeprazole 20 MG capsule  Commonly known as:  PRILOSEC  Take 20 mg by mouth daily.     rivaroxaban 20 MG Tabs tablet  Commonly known as:  XARELTO  Take 1 tablet (20 mg total) by mouth daily with lunch.        Disposition:     Duration of Discharge Encounter: Greater than 30 minutes including physician time.  Signed,  Thompson Grayer MD

## 2014-04-17 NOTE — Discharge Instructions (Signed)
No driving for 5 days, no lifting over 5 pounds for 1 week, no sex for 1 week, may return to work in 1 week Keep site clean and dry, if swelling call the office [phone] or any fevers.    May shower , no hot tubs, swimming for 1 week.    Heart Healthy diet.  Your steri strips may fall off over the next week, that is OK just do not pull off.  If still present on follow up we will remove.      Supplemental Discharge Instructions for  Pacemaker/Defibrillator Patients  Activity No heavy lifting or vigorous activity with your left arm for 6 to 8 weeks.  Do not raise your left arm above your head for one week.  Gradually raise your affected arm as drawn below.           __9/17/15                        04/19/14                  04/21/14                  04/24/14  NO DRIVING for 1 week; you may begin driving on 7/61/47 .  WOUND CARE   Keep the wound area clean and dry.  Do not get this area wet for one week. No showers for one week; you may shower on 04/24/14 and pat steri-strips dry.   The tape/steri-strips on your wound will fall off; do not pull them off.  No bandage is needed on the site.  DO  NOT apply any creams, oils, or ointments to the wound area.   If you notice any drainage or discharge from the wound, any swelling or bruising at the site, or you develop a fever > 101? F after you are discharged home, call the office at once.  Special Instructions   You are still able to use cellular telephones; use the ear opposite the side where you have your pacemaker/defibrillator.  Avoid carrying your cellular phone near your device.   When traveling through airports, show security personnel your identification card to avoid being screened in the metal detectors.  Ask the security personnel to use the hand wand.   Avoid arc welding equipment, MRI testing (magnetic resonance imaging), TENS units (transcutaneous nerve stimulators).  Call the office for questions about other devices.   Avoid  electrical appliances that are in poor condition or are not properly grounded.   Microwave ovens are safe to be near or to operate.  Additional information for defibrillator patients should your device go off:   If your device goes off ONCE and you feel fine afterward, notify the device clinic nurses.   If your device goes off ONCE and you do not feel well afterward, call 911.   If your device goes off TWICE, call 911.   If your device goes off THREE times in one day, call 911.  DO NOT DRIVE YOURSELF OR A FAMILY MEMBER WITH A DEFIBRILLATOR TO THE HOSPITAL--CALL 911.

## 2014-04-28 ENCOUNTER — Ambulatory Visit (INDEPENDENT_AMBULATORY_CARE_PROVIDER_SITE_OTHER): Payer: BC Managed Care – PPO | Admitting: *Deleted

## 2014-04-28 DIAGNOSIS — I5022 Chronic systolic (congestive) heart failure: Secondary | ICD-10-CM

## 2014-04-28 DIAGNOSIS — Z9189 Other specified personal risk factors, not elsewhere classified: Secondary | ICD-10-CM

## 2014-04-28 DIAGNOSIS — Z789 Other specified health status: Secondary | ICD-10-CM

## 2014-04-28 LAB — MDC_IDC_ENUM_SESS_TYPE_INCLINIC
Battery Remaining Longevity: 133 mo
Brady Statistic AP VP Percent: 0.01 %
Brady Statistic AS VP Percent: 0.08 %
Brady Statistic AS VS Percent: 99.79 %
Brady Statistic RA Percent Paced: 0.13 %
Brady Statistic RV Percent Paced: 0.09 %
Date Time Interrogation Session: 20150928145136
HIGH POWER IMPEDANCE MEASURED VALUE: 71 Ohm
HighPow Impedance: 209 Ohm
Lead Channel Impedance Value: 456 Ohm
Lead Channel Pacing Threshold Amplitude: 0.75 V
Lead Channel Pacing Threshold Pulse Width: 0.4 ms
Lead Channel Sensing Intrinsic Amplitude: 3.625 mV
Lead Channel Sensing Intrinsic Amplitude: 7.5 mV
Lead Channel Sensing Intrinsic Amplitude: 8 mV
Lead Channel Setting Pacing Amplitude: 3.5 V
Lead Channel Setting Pacing Amplitude: 3.5 V
Lead Channel Setting Pacing Pulse Width: 0.4 ms
MDC IDC MSMT BATTERY VOLTAGE: 3.12 V
MDC IDC MSMT LEADCHNL RA IMPEDANCE VALUE: 494 Ohm
MDC IDC MSMT LEADCHNL RA PACING THRESHOLD AMPLITUDE: 0.5 V
MDC IDC MSMT LEADCHNL RA PACING THRESHOLD PULSEWIDTH: 0.4 ms
MDC IDC MSMT LEADCHNL RA SENSING INTR AMPL: 5.5 mV
MDC IDC SET LEADCHNL RV SENSING SENSITIVITY: 0.3 mV
MDC IDC SET ZONE DETECTION INTERVAL: 300 ms
MDC IDC SET ZONE DETECTION INTERVAL: 350 ms
MDC IDC SET ZONE DETECTION INTERVAL: 350 ms
MDC IDC STAT BRADY AP VS PERCENT: 0.13 %
Zone Setting Detection Interval: 360 ms

## 2014-04-28 NOTE — Progress Notes (Signed)
Wound check appointment. Steri-strips removed. Wound without redness or edema. Incision edges approximated, wound well healed. Normal device function. Thresholds, sensing, and impedances consistent with implant measurements. Device programmed at 3.5V for extra safety margin until 3 month visit. Histogram distribution appropriate for patient and level of activity. 9 mode switches, + xarelto.  Total burden 1.5%.  No ventricular arrhythmias noted. Patient educated about wound care, arm mobility, lifting restrictions, shock plan. ROV in 3 months with implanting physician.

## 2014-05-05 NOTE — H&P (Signed)
  ICD Criteria  Current LVEF:30% ;Obtained > or = 1 month ago and < or = 3 months ago.  NYHA Functional Classification: Class III  Heart Failure History:  Yes, Duration of heart failure since onset is > 9 months  Non-Ischemic Dilated Cardiomyopathy History:  No.  Atrial Fibrillation/Atrial Flutter:  Yes, A-Fib/A-Flutter type: Persistent (>7 days).  Ventricular Tachycardia History:  No.  Cardiac Arrest History:  No  History of Syndromes with Risk of Sudden Death:  No.  Previous ICD:  No.  Electrophysiology Study: No.  Prior MI: Yes, Most recent MI timeframe is > 40 days.  PPM: No.  OSA:  No  Patient Life Expectancy of >=1 year: Yes.  Anticoagulation Therapy:  Patient is on anticoagulation therapy, anticoagulation was NOT held prior to procedure.   Beta Blocker Therapy:  Yes.   Ace Inhibitor/ARB Therapy:  Yes.

## 2014-05-06 ENCOUNTER — Encounter (HOSPITAL_BASED_OUTPATIENT_CLINIC_OR_DEPARTMENT_OTHER): Payer: BC Managed Care – PPO

## 2014-05-08 ENCOUNTER — Encounter: Payer: Self-pay | Admitting: Internal Medicine

## 2014-05-27 ENCOUNTER — Ambulatory Visit (INDEPENDENT_AMBULATORY_CARE_PROVIDER_SITE_OTHER): Payer: BC Managed Care – PPO | Admitting: Internal Medicine

## 2014-05-27 ENCOUNTER — Encounter: Payer: Self-pay | Admitting: Internal Medicine

## 2014-05-27 VITALS — BP 102/70 | HR 71 | Ht 71.0 in | Wt 297.8 lb

## 2014-05-27 DIAGNOSIS — I4892 Unspecified atrial flutter: Secondary | ICD-10-CM

## 2014-05-27 DIAGNOSIS — I255 Ischemic cardiomyopathy: Secondary | ICD-10-CM

## 2014-05-27 DIAGNOSIS — I5041 Acute combined systolic (congestive) and diastolic (congestive) heart failure: Secondary | ICD-10-CM

## 2014-05-27 DIAGNOSIS — Z95810 Presence of automatic (implantable) cardiac defibrillator: Secondary | ICD-10-CM | POA: Insufficient documentation

## 2014-05-27 DIAGNOSIS — I5022 Chronic systolic (congestive) heart failure: Secondary | ICD-10-CM

## 2014-05-27 LAB — MDC_IDC_ENUM_SESS_TYPE_INCLINIC
Brady Statistic AP VP Percent: 0 %
Brady Statistic AP VS Percent: 2.39 %
Brady Statistic AS VS Percent: 97.57 %
Brady Statistic RA Percent Paced: 2.4 %
Brady Statistic RV Percent Paced: 0.03 %
HIGH POWER IMPEDANCE MEASURED VALUE: 79 Ohm
HighPow Impedance: 209 Ohm
Lead Channel Impedance Value: 456 Ohm
Lead Channel Pacing Threshold Amplitude: 0.75 V
Lead Channel Pacing Threshold Pulse Width: 0.4 ms
Lead Channel Pacing Threshold Pulse Width: 0.4 ms
Lead Channel Sensing Intrinsic Amplitude: 4.125 mV
Lead Channel Sensing Intrinsic Amplitude: 8.75 mV
Lead Channel Setting Pacing Amplitude: 2 V
Lead Channel Setting Pacing Pulse Width: 0.4 ms
Lead Channel Setting Sensing Sensitivity: 0.3 mV
MDC IDC MSMT BATTERY REMAINING LONGEVITY: 133 mo
MDC IDC MSMT BATTERY VOLTAGE: 3.14 V
MDC IDC MSMT LEADCHNL RA IMPEDANCE VALUE: 494 Ohm
MDC IDC MSMT LEADCHNL RA PACING THRESHOLD AMPLITUDE: 0.5 V
MDC IDC MSMT LEADCHNL RA SENSING INTR AMPL: 3.375 mV
MDC IDC MSMT LEADCHNL RV SENSING INTR AMPL: 8.375 mV
MDC IDC SESS DTM: 20151027170302
MDC IDC SET LEADCHNL RV PACING AMPLITUDE: 2.5 V
MDC IDC STAT BRADY AS VP PERCENT: 0.03 %
Zone Setting Detection Interval: 300 ms
Zone Setting Detection Interval: 350 ms
Zone Setting Detection Interval: 350 ms
Zone Setting Detection Interval: 360 ms

## 2014-05-27 NOTE — Progress Notes (Signed)
HPI Colton Lamb returns today for followup. He is a very pleasant 63 year old man with morbid obesity, chronic systolic heart failure, atrial flutter, and hypertension. He underwent catheter ablation of atrial flutter and insertion of a DDD ICD several months ago. In the interim, he has been stable. He denies palpitations, chest pain, shortness of breath is much improved. His appetite is good. He has had no syncope or ICD shock. Allergies  Allergen Reactions  . Iodine Other (See Comments)    Shortness of breath, swelling and hives  . Shrimp [Shellfish Allergy] Other (See Comments)    SWELLING    HIVES    SHORTNESS OF BREATH  . Tetracycline Rash     Current Outpatient Prescriptions  Medication Sig Dispense Refill  . acetaminophen (TYLENOL) 325 MG tablet Take 1-2 tablets (325-650 mg total) by mouth every 4 (four) hours as needed for mild pain.      Marland Kitchen aspirin 81 MG tablet Take 81 mg by mouth daily.       Marland Kitchen atorvastatin (LIPITOR) 80 MG tablet Take 80 mg by mouth daily.      . balsalazide (COLAZAL) 750 MG capsule Take 750 mg by mouth 3 (three) times daily.      . carvedilol (COREG) 12.5 MG tablet Take 12.5 mg by mouth 2 (two) times daily.      . furosemide (LASIX) 40 MG tablet Take 40 mg by mouth 2 (two) times daily.      Marland Kitchen lisinopril (PRINIVIL,ZESTRIL) 20 MG tablet Take 20 mg by mouth daily.      . niacin (NIASPAN) 1000 MG CR tablet Take 1,000 mg by mouth at bedtime.      . nitroGLYCERIN (NITROSTAT) 0.4 MG SL tablet Place 0.4 mg under the tongue every 5 (five) minutes as needed (MAX 3 TABLETS). FOR CHEST PAIN      . omeprazole (PRILOSEC) 20 MG capsule Take 20 mg by mouth daily.       . rivaroxaban (XARELTO) 20 MG TABS tablet Take 1 tablet (20 mg total) by mouth daily with lunch.  30 tablet  11  . amiodarone (PACERONE) 200 MG tablet Take 1 tablet (200 mg total) by mouth daily.       No current facility-administered medications for this visit.     Past Medical History  Diagnosis  Date  . Crohn's ileocolitis   . GERD (gastroesophageal reflux disease)   . Hx of adenomatous colonic polyps 11/2003  . Coronary artery disease     s/p stent placement  . Hypertension   . Obesity   . Atrial fibrillation   . Chronic systolic heart failure     a) EF 20-25%, grade III DD, RV mildly dilated and sys fx mild/mod reduced (10/2013)  . Ischemic cardiomyopathy     a) LHC (10/2013): LAD stent occlusion, duration unknown. QS pattern across precordium on EKG suggest not acute, moderate distal RCA obstruction and widely patent circumflex coronary artery  . Atrial flutter     a) s/p Cardioversion 11/22/13, on amiodarone and Xarelto  . Dysrhythmia     ATRIAL FIBRILATION  . Syncope 11/2013    in setting of volume depletion and bradycardia due to dig toxicity   . CHF (congestive heart failure)   . Automatic implantable cardioverter-defibrillator in situ   . High cholesterol   . MI (myocardial infarction) 2007; 2015  . Sleep apnea     "have test scheduled for new mask" (04/16/2014)    ROS:   All  systems reviewed and negative except as noted in the HPI.   Past Surgical History  Procedure Laterality Date  . 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 Hydrocele excision Bilateral   . Foot surgery Left     bone spur  . Cardioversion N/A 03/05/2014    Procedure: CARDIOVERSION;  Surgeon: Jolaine Artist, MD;  Location: [facility] ENDOSCOPY;  Service: Cardiovascular;  Laterality: N/A;  . Cardiac defibrillator placement  04/16/2014    Medtronic Evira device  . Cardiac electrophysiology study and ablation  04/16/2014    atrial flutter ablation  . Coronary angioplasty with stent placement  2007; 2008 X 2    "1+1 ~ 1"  . Cardiac catheterization  10/2013     Family History  Problem Relation Age of Onset  . Breast cancer Mother   . Heart disease Father   . Colon cancer Neg Hx   . Heart attack Father      History   Social History  . Marital Status: Widowed     Spouse Name: N/A    Number of Children: 1  . Years of Education: N/A   Occupational History  . retired    Social History Main Topics  . Smoking status: Never Smoker   . Smokeless tobacco: Never Used  . Alcohol Use: No  . Drug Use: No  . Sexual Activity: No   Other Topics Concern  . Not on file   Social History Narrative  . No narrative on file     BP 102/70  Pulse 71  Ht 5' 11"  (1.803 m)  Wt 297 lb 12.8 oz (135.081 kg)  BMI 41.55 kg/m2  Physical Exam:  Well appearing middle-aged man, obese, NAD HEENT: Unremarkable Neck:  No JVD, no thyromegally Back:  No CVA tenderness Lungs:  Clear with no wheezes, rales, or rhonchi. HEART:  Regular rate rhythm, no murmurs, no rubs, no clicks Abd:  soft, positive bowel sounds, no organomegally, no rebound, no guarding Ext:  2 plus pulses, no edema, no cyanosis, no clubbing Skin:  No rashes no nodules Neuro:  CN II through XII intact, motor grossly intact  EKG - normal sinus rhythm  DEVICE  Normal device function.  See PaceArt for details.   Assess/Plan:

## 2014-05-27 NOTE — Assessment & Plan Note (Signed)
He is maintaining sinus rhythm very nicely after catheter ablation of atrial flutter. I've asked the patient to reduce his dose of amiodarone from 400 mg daily down to 200 mg daily.

## 2014-05-27 NOTE — Assessment & Plan Note (Signed)
His Medtronic dual-chamber ICD is working normally. No change in therapy. We'll plan to recheck in several months.

## 2014-05-27 NOTE — Assessment & Plan Note (Signed)
His chronic systolic heart failure is well compensated and class II. He will continue his current medical therapy.

## 2014-05-27 NOTE — Assessment & Plan Note (Signed)
His chronic systolic heart failure is currently well compensated and class II. He will continue his current medical therapy.

## 2014-05-27 NOTE — Patient Instructions (Signed)
Your physician has recommended you make the following change in your medication:  1) decrease amiodarone to 200 mg once daily  Your physician recommends that you schedule a follow-up appointment in: 6 weeks with Dr. Rockey Situ   Remote monitoring is used to monitor your Pacemaker of ICD from home. This monitoring reduces the number of office visits required to check your device to one time per year. It allows [place] to keep an eye on the functioning of your device to ensure it is working properly. You are scheduled for a device check from home on 08/28/14. You may send your transmission at any time that day. If you have a wireless device, the transmission will be sent automatically. After your physician reviews your transmission, you will receive a postcard with your next transmission date.  Your physician wants you to follow-up in: 9 months with Dr. Lovena Le. You will receive a reminder letter in the mail two months in advance. If you don't receive a letter, please call our office to schedule the follow-up appointment.

## 2014-05-29 ENCOUNTER — Ambulatory Visit: Payer: BC Managed Care – PPO | Admitting: Cardiovascular Disease

## 2014-06-16 ENCOUNTER — Other Ambulatory Visit: Payer: Self-pay | Admitting: Cardiovascular Disease

## 2014-07-08 ENCOUNTER — Encounter: Payer: Self-pay | Admitting: Cardiovascular Disease

## 2014-07-08 ENCOUNTER — Ambulatory Visit (INDEPENDENT_AMBULATORY_CARE_PROVIDER_SITE_OTHER): Payer: BC Managed Care – PPO | Admitting: Cardiovascular Disease

## 2014-07-08 VITALS — BP 120/82 | HR 81 | Ht 71.0 in | Wt 292.2 lb

## 2014-07-08 DIAGNOSIS — R609 Edema, unspecified: Secondary | ICD-10-CM

## 2014-07-08 DIAGNOSIS — R001 Bradycardia, unspecified: Secondary | ICD-10-CM

## 2014-07-08 DIAGNOSIS — I2510 Atherosclerotic heart disease of native coronary artery without angina pectoris: Secondary | ICD-10-CM

## 2014-07-08 DIAGNOSIS — E669 Obesity, unspecified: Secondary | ICD-10-CM

## 2014-07-08 DIAGNOSIS — Z9861 Coronary angioplasty status: Secondary | ICD-10-CM

## 2014-07-08 DIAGNOSIS — IMO0002 Reserved for concepts with insufficient information to code with codable children: Secondary | ICD-10-CM | POA: Insufficient documentation

## 2014-07-08 DIAGNOSIS — I4892 Unspecified atrial flutter: Secondary | ICD-10-CM

## 2014-07-08 DIAGNOSIS — E118 Type 2 diabetes mellitus with unspecified complications: Secondary | ICD-10-CM

## 2014-07-08 DIAGNOSIS — E1165 Type 2 diabetes mellitus with hyperglycemia: Secondary | ICD-10-CM

## 2014-07-08 DIAGNOSIS — E785 Hyperlipidemia, unspecified: Secondary | ICD-10-CM

## 2014-07-08 DIAGNOSIS — I5022 Chronic systolic (congestive) heart failure: Secondary | ICD-10-CM

## 2014-07-08 NOTE — Assessment & Plan Note (Signed)
We have encouraged continued exercise, careful diet management in an effort to lose weight. 

## 2014-07-08 NOTE — Assessment & Plan Note (Signed)
Cholesterol is at goal on the current lipid regimen. No changes to the medications were made.  

## 2014-07-08 NOTE — Assessment & Plan Note (Signed)
No significant edema on today's visit. We'll continue Lasix 40 twice a day

## 2014-07-08 NOTE — Patient Instructions (Signed)
You are doing well. No medication changes were made.  Consider an appt with Dr. Eddie Dibbles or [facility] for diabetes  Please call us if you have new issues that need to be addressed before your next appt.  Your physician wants you to follow-up in: 6 months.  You will receive a reminder letter in the mail two months in advance. If you don't receive a letter, please call our office to schedule the follow-up appointment.

## 2014-07-08 NOTE — Assessment & Plan Note (Addendum)
Currently with no symptoms of angina. No further workup at this time. Continue current medication regimen. Stressed to him the importance of good diabetes control

## 2014-07-08 NOTE — Assessment & Plan Note (Signed)
Status post ablation. Still on low-dose amiodarone Followed by EP

## 2014-07-08 NOTE — Progress Notes (Signed)
Patient ID: Colton Lamb, male    DOB: 07-13-1951, 63 y.o.   MRN: [id number]  HPI Comments: [name] is a 63 year old gentleman with a history of paroxysmal atrial fibrillation,  hyperlipidemia, history of Crohn's ileocolitis, coronary artery disease, PCI x3 to the LAD, circumflex, PDA and PL branch, ejection fraction 45-50% with anterior hypokinesis at that time,  catheterization in 2008  with a cypher stent to the OM, sleep apnea, has not been using CPAP.  He has poorly controlled diabetes, little follow-up with his primary care S/p catheter ablation of atrial flutter and insertion of a DDD ICD. no syncope or ICD shock.  In follow-up today, he reports that he feels well. He denies any significant shortness of breath or chest discomfort. No leg swelling, weight has been stable. He takes Lasix 40 mg twice a day No recent follow-up with primary care. In fact he says that his primary care has retired and he needs to establish with the new primary care Previously seen by Dr. Eddie Dibbles, endocrine. Has not had any recent follow-up He does not check his sugars at home Lab in the hospital seem to show sugars ranging from the 200 up into the 300 range He reports that he is previously on metformin but he does not know when. He is no longer on this medication  EKG on today's visit shows normal sinus rhythm with rate 81 bpm, poor R-wave progression through the anterior precordial leads consistent with old anterior MI   Other past medical history Admitted in April 2015 with atrial flutter, non-ST elevation MI, with cardioversion, cardiac catheterization showing occluded LAD,  moderate distal RCA disease, patent left circumflex.. Medical management recommended.  Severely depressed ejection fraction noted, LifeVest was placed at that time. Ejection fraction 20-25%   hospital admission May 13 with discharge 12/14/2013 with dig toxicity, junctional rhythm, bradycardia with heart rates in the 30s, syncope.   Acute renal failure likely secondary to ATN. Also noted to have TSH of 10. ACE inhibitor, spironolactone, isosorbide was held. Renal function improved at the time of discharge  Followup echocardiogram 01/29/2014 showing improved ejection fraction up to 30-35%   Outpatient Encounter Prescriptions as of 07/08/2014  Medication Sig  . acetaminophen (TYLENOL) 325 MG tablet Take 1-2 tablets (325-650 mg total) by mouth every 4 (four) hours as needed for mild pain.  Marland Kitchen amiodarone (PACERONE) 200 MG tablet Take 1 tablet (200 mg total) by mouth daily.  Marland Kitchen aspirin 81 MG tablet Take 81 mg by mouth daily.   Marland Kitchen atorvastatin (LIPITOR) 80 MG tablet Take 80 mg by mouth daily.  . balsalazide (COLAZAL) 750 MG capsule Take 750 mg by mouth 3 (three) times daily.  . carvedilol (COREG) 12.5 MG tablet Take 12.5 mg by mouth 2 (two) times daily.  . furosemide (LASIX) 40 MG tablet Take 40 mg by mouth 2 (two) times daily.  Marland Kitchen lisinopril (PRINIVIL,ZESTRIL) 20 MG tablet Take 20 mg by mouth daily.  . niacin (NIASPAN) 1000 MG CR tablet Take 1,000 mg by mouth at bedtime.  . nitroGLYCERIN (NITROSTAT) 0.4 MG SL tablet Place 0.4 mg under the tongue every 5 (five) minutes as needed (MAX 3 TABLETS). FOR CHEST PAIN  . omeprazole (PRILOSEC) 20 MG capsule Take 20 mg by mouth daily.   . rivaroxaban (XARELTO) 20 MG TABS tablet Take 1 tablet (20 mg total) by mouth daily with lunch.  . [DISCONTINUED] atorvastatin (LIPITOR) 80 MG tablet TAKE 1 TABLET BY MOUTH EVERY DAY (Patient not taking: Reported on 07/08/2014)  Social Hx:  reports that he has never smoked. He has never used smokeless tobacco. He reports that he does not drink alcohol or use illicit drugs.  Review of Systems  Respiratory: Negative.   Cardiovascular: Negative.   Gastrointestinal: Negative.   Musculoskeletal: Negative.   Allergic/Immunologic: Negative.   Neurological: Negative.   Hematological: Negative.   Psychiatric/Behavioral: Negative.   All other systems  reviewed and are negative.   BP 120/82 mmHg  Pulse 81  Ht 5' 11"  (1.803 m)  Wt 292 lb 4 oz (132.564 kg)  BMI 40.78 kg/m2  Physical Exam  Constitutional: He is oriented to person, place, and time. He appears well-developed and well-nourished.  Obese  HENT:  Head: Normocephalic.  Nose: Nose normal.  Mouth/Throat: Oropharynx is clear and moist.  Eyes: Conjunctivae are normal. Pupils are equal, round, and reactive to light.  Neck: Normal range of motion. Neck supple. No JVD present.  Cardiovascular: Normal rate, regular rhythm, S1 normal, S2 normal, normal heart sounds and intact distal pulses.  Exam reveals no gallop and no friction rub.   No murmur heard. Pulmonary/Chest: Effort normal and breath sounds normal. No respiratory distress. He has no wheezes. He has no rales. He exhibits no tenderness.  Abdominal: Soft. Bowel sounds are normal. He exhibits no distension. There is no tenderness.  Musculoskeletal: Normal range of motion. He exhibits no edema or tenderness.  Lymphadenopathy:    He has no cervical adenopathy.  Neurological: He is alert and oriented to person, place, and time. Coordination normal.  Skin: Skin is warm and dry. No rash noted. No erythema.  Psychiatric: He has a normal mood and affect. His behavior is normal. Judgment and thought content normal.      Assessment and Plan   Nursing note and vitals reviewed.

## 2014-07-08 NOTE — Assessment & Plan Note (Signed)
Euvolemic on today's visit. No changes to his medications. Continue carvedilol, ACE inhibitor, Lasix twice a day He monitors his weight at home

## 2014-07-08 NOTE — Assessment & Plan Note (Signed)
We spent most of today's visit talking about his poorly controlled diabetes. He has little follow-up with primary care, little follow-up with endocrine. Not on any medications for diabetes, not checking his sugars at home. Lab work from the hospital showing sugars typically 200 to 300s. Strongly recommended he establish care with his new primary care physician, consider repeat appointment with Dr. Eddie Dibbles of endocrine. He will likely need to be set up with home monitoring, frequent visits, education.

## 2014-07-10 ENCOUNTER — Encounter (HOSPITAL_COMMUNITY): Payer: Self-pay | Admitting: Interventional Cardiology

## 2014-07-21 ENCOUNTER — Other Ambulatory Visit: Payer: Self-pay | Admitting: Cardiovascular Disease

## 2014-08-14 ENCOUNTER — Encounter (HOSPITAL_COMMUNITY): Payer: Self-pay | Admitting: Cardiology

## 2014-08-28 ENCOUNTER — Ambulatory Visit (INDEPENDENT_AMBULATORY_CARE_PROVIDER_SITE_OTHER): Payer: BC Managed Care – PPO | Admitting: *Deleted

## 2014-08-28 ENCOUNTER — Telehealth: Payer: Self-pay | Admitting: Cardiology

## 2014-08-28 DIAGNOSIS — I5022 Chronic systolic (congestive) heart failure: Secondary | ICD-10-CM

## 2014-08-28 DIAGNOSIS — I255 Ischemic cardiomyopathy: Secondary | ICD-10-CM

## 2014-08-28 LAB — MDC_IDC_ENUM_SESS_TYPE_REMOTE
Battery Remaining Longevity: 131 mo
Brady Statistic AP VP Percent: 0 %
Brady Statistic AP VS Percent: 0.47 %
Brady Statistic AS VP Percent: 0.03 %
Brady Statistic AS VS Percent: 99.5 %
HighPow Impedance: 85 Ohm
Lead Channel Impedance Value: 342 Ohm
Lead Channel Pacing Threshold Amplitude: 0.625 V
Lead Channel Pacing Threshold Amplitude: 0.875 V
Lead Channel Sensing Intrinsic Amplitude: 3 mV
Lead Channel Sensing Intrinsic Amplitude: 8.125 mV
Lead Channel Setting Pacing Amplitude: 1.5 V
Lead Channel Setting Pacing Pulse Width: 0.4 ms
MDC IDC MSMT BATTERY VOLTAGE: 3.09 V
MDC IDC MSMT LEADCHNL RA IMPEDANCE VALUE: 494 Ohm
MDC IDC MSMT LEADCHNL RA PACING THRESHOLD PULSEWIDTH: 0.4 ms
MDC IDC MSMT LEADCHNL RV IMPEDANCE VALUE: 437 Ohm
MDC IDC MSMT LEADCHNL RV PACING THRESHOLD PULSEWIDTH: 0.4 ms
MDC IDC SESS DTM: 20160128200536
MDC IDC SET LEADCHNL RV PACING AMPLITUDE: 2 V
MDC IDC SET LEADCHNL RV SENSING SENSITIVITY: 0.3 mV
MDC IDC SET ZONE DETECTION INTERVAL: 300 ms
MDC IDC SET ZONE DETECTION INTERVAL: 350 ms
MDC IDC SET ZONE DETECTION INTERVAL: 360 ms
MDC IDC STAT BRADY RA PERCENT PACED: 0.47 %
MDC IDC STAT BRADY RV PERCENT PACED: 0.03 %
Zone Setting Detection Interval: 350 ms

## 2014-08-28 NOTE — Telephone Encounter (Signed)
Spoke with pt and reminded pt of remote transmission that is due today. Pt verbalized understanding.   

## 2014-08-28 NOTE — Progress Notes (Signed)
Remote ICD transmission.   

## 2014-09-03 ENCOUNTER — Encounter: Payer: Self-pay | Admitting: Cardiology

## 2014-09-15 ENCOUNTER — Other Ambulatory Visit: Payer: Self-pay | Admitting: Cardiovascular Disease

## 2014-10-02 ENCOUNTER — Encounter: Payer: Self-pay | Admitting: Gastroenterology

## 2014-10-09 ENCOUNTER — Encounter: Payer: Self-pay | Admitting: Internal Medicine

## 2014-10-21 ENCOUNTER — Other Ambulatory Visit (HOSPITAL_COMMUNITY): Payer: Self-pay | Admitting: *Deleted

## 2014-10-21 MED ORDER — CARVEDILOL 12.5 MG PO TABS: 12.5000 mg | ORAL_TABLET | Freq: Two times a day (BID) | ORAL | Status: DC

## 2014-11-20 ENCOUNTER — Other Ambulatory Visit: Payer: Self-pay | Admitting: Cardiovascular Disease

## 2014-11-20 ENCOUNTER — Other Ambulatory Visit: Payer: Self-pay | Admitting: Physician Assistant

## 2014-11-24 ENCOUNTER — Other Ambulatory Visit: Payer: Self-pay | Admitting: Physician Assistant

## 2014-11-24 ENCOUNTER — Other Ambulatory Visit: Payer: Self-pay | Admitting: Cardiovascular Disease

## 2014-11-24 ENCOUNTER — Other Ambulatory Visit (HOSPITAL_COMMUNITY): Payer: Self-pay | Admitting: Internal Medicine

## 2014-12-01 ENCOUNTER — Encounter: Payer: Self-pay | Admitting: Internal Medicine

## 2014-12-01 ENCOUNTER — Ambulatory Visit (INDEPENDENT_AMBULATORY_CARE_PROVIDER_SITE_OTHER): Payer: Self-pay | Admitting: *Deleted

## 2014-12-01 DIAGNOSIS — I5022 Chronic systolic (congestive) heart failure: Secondary | ICD-10-CM

## 2014-12-01 DIAGNOSIS — I255 Ischemic cardiomyopathy: Secondary | ICD-10-CM

## 2014-12-01 NOTE — Progress Notes (Signed)
Remote ICD transmission.   

## 2014-12-06 LAB — CUP PACEART REMOTE DEVICE CHECK
Battery Voltage: 3.05 V
Brady Statistic AP VP Percent: 0 %
Brady Statistic AP VS Percent: 1.47 %
Brady Statistic AS VP Percent: 0.03 %
Brady Statistic RA Percent Paced: 1.48 %
Brady Statistic RV Percent Paced: 0.03 %
Date Time Interrogation Session: 20160502052303
HighPow Impedance: 85 Ohm
Lead Channel Impedance Value: 342 Ohm
Lead Channel Impedance Value: 513 Ohm
Lead Channel Pacing Threshold Amplitude: 0.5 V
Lead Channel Pacing Threshold Pulse Width: 0.4 ms
Lead Channel Sensing Intrinsic Amplitude: 7.25 mV
Lead Channel Sensing Intrinsic Amplitude: 7.25 mV
Lead Channel Setting Pacing Amplitude: 2 V
Lead Channel Setting Pacing Pulse Width: 0.4 ms
Lead Channel Setting Sensing Sensitivity: 0.3 mV
MDC IDC MSMT BATTERY REMAINING LONGEVITY: 129 mo
MDC IDC MSMT LEADCHNL RA SENSING INTR AMPL: 3.75 mV
MDC IDC MSMT LEADCHNL RA SENSING INTR AMPL: 3.75 mV
MDC IDC MSMT LEADCHNL RV IMPEDANCE VALUE: 399 Ohm
MDC IDC MSMT LEADCHNL RV PACING THRESHOLD AMPLITUDE: 0.875 V
MDC IDC MSMT LEADCHNL RV PACING THRESHOLD PULSEWIDTH: 0.4 ms
MDC IDC SET LEADCHNL RA PACING AMPLITUDE: 1.5 V
MDC IDC SET ZONE DETECTION INTERVAL: 360 ms
MDC IDC STAT BRADY AS VS PERCENT: 98.5 %
Zone Setting Detection Interval: 300 ms
Zone Setting Detection Interval: 350 ms
Zone Setting Detection Interval: 350 ms

## 2014-12-11 ENCOUNTER — Encounter: Payer: Self-pay | Admitting: Cardiology

## 2015-01-14 ENCOUNTER — Other Ambulatory Visit: Payer: Self-pay | Admitting: *Deleted

## 2015-01-14 MED ORDER — LISINOPRIL 20 MG PO TABS: ORAL_TABLET | ORAL | Status: DC

## 2015-01-20 ENCOUNTER — Other Ambulatory Visit: Payer: Self-pay | Admitting: Gastroenterology

## 2015-01-20 ENCOUNTER — Other Ambulatory Visit: Payer: Self-pay | Admitting: *Deleted

## 2015-01-20 MED ORDER — ATORVASTATIN CALCIUM 80 MG PO TABS: 80.0000 mg | ORAL_TABLET | Freq: Every day | ORAL | Status: DC

## 2015-01-24 ENCOUNTER — Other Ambulatory Visit (HOSPITAL_COMMUNITY): Payer: Self-pay | Admitting: Internal Medicine

## 2015-01-26 ENCOUNTER — Other Ambulatory Visit: Payer: Self-pay

## 2015-01-26 MED ORDER — FUROSEMIDE 40 MG PO TABS: 40.0000 mg | ORAL_TABLET | Freq: Two times a day (BID) | ORAL | Status: DC

## 2015-01-26 MED ORDER — ATORVASTATIN CALCIUM 80 MG PO TABS: 80.0000 mg | ORAL_TABLET | Freq: Every day | ORAL | Status: DC

## 2015-01-26 NOTE — Telephone Encounter (Signed)
Refill sent for atorvastatin.

## 2015-01-26 NOTE — Telephone Encounter (Signed)
Refill sent for furosemide 40 mg.

## 2015-02-13 ENCOUNTER — Encounter: Payer: Self-pay | Admitting: Gastroenterology

## 2015-02-17 ENCOUNTER — Other Ambulatory Visit: Payer: Self-pay | Admitting: Gastroenterology

## 2015-02-25 ENCOUNTER — Ambulatory Visit (INDEPENDENT_AMBULATORY_CARE_PROVIDER_SITE_OTHER): Payer: BLUE CROSS/BLUE SHIELD | Admitting: Cardiovascular Disease

## 2015-02-25 ENCOUNTER — Encounter: Payer: Self-pay | Admitting: Cardiovascular Disease

## 2015-02-25 VITALS — BP 100/68 | HR 76 | Ht 71.0 in | Wt 277.5 lb

## 2015-02-25 DIAGNOSIS — I4891 Unspecified atrial fibrillation: Secondary | ICD-10-CM | POA: Diagnosis not present

## 2015-02-25 DIAGNOSIS — IMO0002 Reserved for concepts with insufficient information to code with codable children: Secondary | ICD-10-CM

## 2015-02-25 DIAGNOSIS — G4733 Obstructive sleep apnea (adult) (pediatric): Secondary | ICD-10-CM

## 2015-02-25 DIAGNOSIS — I5022 Chronic systolic (congestive) heart failure: Secondary | ICD-10-CM

## 2015-02-25 DIAGNOSIS — I2510 Atherosclerotic heart disease of native coronary artery without angina pectoris: Secondary | ICD-10-CM

## 2015-02-25 DIAGNOSIS — E1165 Type 2 diabetes mellitus with hyperglycemia: Secondary | ICD-10-CM

## 2015-02-25 DIAGNOSIS — E669 Obesity, unspecified: Secondary | ICD-10-CM

## 2015-02-25 DIAGNOSIS — Z9861 Coronary angioplasty status: Secondary | ICD-10-CM | POA: Diagnosis not present

## 2015-02-25 DIAGNOSIS — E118 Type 2 diabetes mellitus with unspecified complications: Secondary | ICD-10-CM

## 2015-02-25 DIAGNOSIS — Z95810 Presence of automatic (implantable) cardiac defibrillator: Secondary | ICD-10-CM

## 2015-02-25 MED ORDER — SACUBITRIL-VALSARTAN 24-26 MG PO TABS: 1.0000 | ORAL_TABLET | Freq: Two times a day (BID) | ORAL | Status: DC

## 2015-02-25 NOTE — Progress Notes (Signed)
Patient ID: Colton Lamb, male    DOB: 05-16-1951, 64 y.o.   MRN: [id number]  HPI Comments: Mr. Shedden is a 64 year old gentleman with a history of paroxysmal atrial fibrillation,  hyperlipidemia, history of Crohn's ileocolitis, coronary artery disease, PCI x3 to the LAD, circumflex, PDA and PL branch, ejection fraction 45-50% with anterior hypokinesis at that time,  catheterization in 2008  with a cypher stent to the OM, sleep apnea, has not been using CPAP.  He has poorly controlled diabetes, little follow-up with his primary care S/p catheter ablation of atrial flutter and insertion of a DDD ICD. no syncope or ICD shock. He presents today for follow-up of his coronary artery disease, atrial fibrillation  In follow-up today he reports that he feels well He is taking Lasix 40 mg twice a day. No recent lab work available Has not seen endocrine in a long time, previously seen by Dr. Eddie Dibbles, endocrine.  Has not had follow-up with primary care He does not check his sugars at home  Denies having any lower extremity edema. No syncope or syncope  EKG on today's visit shows normal sinus rhythm with rate 76 bpm, poor R-wave progression through the anterior precordial leads consistent with old anterior MI  Other past medical history Admitted in April 2015 with atrial flutter, non-ST elevation MI, with cardioversion, cardiac catheterization showing occluded LAD,  moderate distal RCA disease, patent left circumflex.. Medical management recommended.  Severely depressed ejection fraction noted, LifeVest was placed at that time. Ejection fraction 20-25%   hospital admission May 13 with discharge 12/14/2013 with dig toxicity, junctional rhythm, bradycardia with heart rates in the 30s, syncope.  Acute renal failure likely secondary to ATN. Also noted to have TSH of 10. ACE inhibitor, spironolactone, isosorbide was held. Renal function improved at the time of discharge  Followup echocardiogram  01/29/2014 showing improved ejection fraction up to 30-35%  Allergies  Allergen Reactions  . Iodine Other (See Comments)    Shortness of breath, swelling and hives  . Shrimp [Shellfish Allergy] Other (See Comments)    SWELLING    HIVES    SHORTNESS OF BREATH  . Tetracycline Rash    Current Outpatient Prescriptions on File Prior to Visit  Medication Sig Dispense Refill  . acetaminophen (TYLENOL) 325 MG tablet Take 1-2 tablets (325-650 mg total) by mouth every 4 (four) hours as needed for mild pain.    Marland Kitchen amiodarone (PACERONE) 200 MG tablet Take 1 tablet (200 mg total) by mouth daily.    Marland Kitchen aspirin 81 MG tablet Take 81 mg by mouth daily.     Marland Kitchen atorvastatin (LIPITOR) 80 MG tablet Take 1 tablet (80 mg total) by mouth daily. 30 tablet 6  . balsalazide (COLAZAL) 750 MG capsule TAKE 1 CAPSULE (750 MG TOTAL) BY MOUTH 3 (THREE) TIMES DAILY. 90 capsule 0  . carvedilol (COREG) 12.5 MG tablet TAKE 1 TABLET BY MOUTH TWICE A DAY 60 tablet 1  . digoxin (LANOXIN) 0.25 MG tablet TAKE 1 TABLET (0.25 MG TOTAL) BY MOUTH DAILY. 30 tablet 3  . furosemide (LASIX) 40 MG tablet Take 1 tablet (40 mg total) by mouth 2 (two) times daily. 60 tablet 6  . niacin (NIASPAN) 1000 MG CR tablet Take 1,000 mg by mouth at bedtime.    . nitroGLYCERIN (NITROSTAT) 0.4 MG SL tablet Place 0.4 mg under the tongue every 5 (five) minutes as needed (MAX 3 TABLETS). FOR CHEST PAIN    . omeprazole (PRILOSEC) 20 MG capsule Take 20 mg  by mouth daily.     Alveda Reasons 20 MG TABS tablet TAKE 1 TABLET BY MOUTH EVERY DAY WITH LUNCH 30 tablet 3   No current facility-administered medications on file prior to visit.    Past Medical History  Diagnosis Date  . Crohn's ileocolitis   . GERD (gastroesophageal reflux disease)   . Hx of adenomatous colonic polyps 11/2003  . Coronary artery disease     s/p stent placement  . Hypertension   . Obesity   . Atrial fibrillation   . Chronic systolic heart failure     a) EF 20-25%, grade III DD, RV  mildly dilated and sys fx mild/mod reduced (10/2013)  . Ischemic cardiomyopathy     a) LHC (10/2013): LAD stent occlusion, duration unknown. QS pattern across precordium on EKG suggest not acute, moderate distal RCA obstruction and widely patent circumflex coronary artery  . Atrial flutter     a) s/p Cardioversion 11/22/13, on amiodarone and Xarelto  . Dysrhythmia     ATRIAL FIBRILATION  . Syncope 11/2013    in setting of volume depletion and bradycardia due to dig toxicity   . CHF (congestive heart failure)   . Automatic implantable cardioverter-defibrillator in situ   . High cholesterol   . MI (myocardial infarction) 2007; 2015  . Sleep apnea     "have test scheduled for new mask" (04/16/2014)    Past Surgical History  Procedure Laterality Date  . 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 Hydrocele excision Bilateral   . Foot surgery Left     bone spur  . Cardioversion N/A 03/05/2014    Procedure: CARDIOVERSION;  Surgeon: Jolaine Artist, MD;  Location: [facility] ENDOSCOPY;  Service: Cardiovascular;  Laterality: N/A;  . Cardiac defibrillator placement  04/16/2014    Medtronic Evira device  . Cardiac electrophysiology study and ablation  04/16/2014    atrial flutter ablation  . Coronary angioplasty with stent placement  2007; 2008 X 2    "1+1 ~ 1"  . Cardiac catheterization  10/2013  . Left heart catheterization with coronary angiogram N/A 11/22/2013    Procedure: LEFT HEART CATHETERIZATION WITH CORONARY ANGIOGRAM;  Surgeon: Sinclair Grooms, MD;  Location: [facility] CATH LAB;  Service: Cardiovascular;  Laterality: N/A;  . Atrial flutter ablation N/A 04/16/2014    Procedure: ATRIAL FLUTTER ABLATION;  Surgeon: Evans Lance, MD;  Location: [facility] CATH LAB;  Service: Cardiovascular;  Laterality: N/A;  . Implantable cardioverter defibrillator implant N/A 04/16/2014    Procedure: IMPLANTABLE CARDIOVERTER DEFIBRILLATOR IMPLANT;  Surgeon: Evans Lance, MD;  Location: [facility] CATH  LAB;  Service: Cardiovascular;  Laterality: N/A;    Social History  reports that he has never smoked. He has never used smokeless tobacco. He reports that he does not drink alcohol or use illicit drugs.  Family History family history includes Breast cancer in his mother; Heart attack in his father; Heart disease in his father. There is no history of Colon cancer.  Review of Systems  Respiratory: Negative.   Cardiovascular: Negative.   Gastrointestinal: Negative.   Musculoskeletal: Negative.   Allergic/Immunologic: Negative.   Neurological: Negative.   Hematological: Negative.   Psychiatric/Behavioral: Negative.   All other systems reviewed and are negative.   BP 100/68 mmHg  Pulse 76  Ht 5' 11"  (1.803 m)  Wt 277 lb 8 oz (125.873 kg)  BMI 38.72 kg/m2  Physical Exam  Constitutional: He is oriented to person, place, and time. He appears well-developed  and well-nourished.  Obese  HENT:  Head: Normocephalic.  Nose: Nose normal.  Mouth/Throat: Oropharynx is clear and moist.  Eyes: Conjunctivae are normal. Pupils are equal, round, and reactive to light.  Neck: Normal range of motion. Neck supple. No JVD present.  Cardiovascular: Normal rate, regular rhythm, S1 normal, S2 normal, normal heart sounds and intact distal pulses.  Exam reveals no gallop and no friction rub.   No murmur heard. Pulmonary/Chest: Effort normal and breath sounds normal. No respiratory distress. He has no wheezes. He has no rales. He exhibits no tenderness.  Abdominal: Soft. Bowel sounds are normal. He exhibits no distension. There is no tenderness.  Musculoskeletal: Normal range of motion. He exhibits no edema or tenderness.  Lymphadenopathy:    He has no cervical adenopathy.  Neurological: He is alert and oriented to person, place, and time. Coordination normal.  Skin: Skin is warm and dry. No rash noted. No erythema.  Psychiatric: He has a normal mood and affect. His behavior is normal. Judgment and  thought content normal.      Assessment and Plan   Nursing note and vitals reviewed.

## 2015-02-25 NOTE — Assessment & Plan Note (Signed)
Recommended he establish with primary care, follow up with endocrine

## 2015-02-25 NOTE — Assessment & Plan Note (Signed)
Followed by EP

## 2015-02-25 NOTE — Assessment & Plan Note (Signed)
Noncompliant in the past with his CPAP

## 2015-02-25 NOTE — Patient Instructions (Addendum)
You are doing well.  Hold the lisinopril Start entresto one pill twice a day  We will check a CMP today  Please call us if you have new issues that need to be addressed before your next appt.  Your physician wants you to follow-up in: 1 month

## 2015-02-25 NOTE — Assessment & Plan Note (Signed)
We recommended he hold his lisinopril We'll start entresto 25/25 mg po bid, follow-up in one month for further medication titration BMP ordered for today

## 2015-02-25 NOTE — Assessment & Plan Note (Signed)
We have encouraged continued exercise, careful diet management in an effort to lose weight. 

## 2015-02-25 NOTE — Assessment & Plan Note (Signed)
Maintaining normal sinus rhythm Recommended he continue low-dose amiodarone, carvedilol

## 2015-02-25 NOTE — Assessment & Plan Note (Signed)
Currently with no symptoms of angina. No further workup at this time. Continue current medication regimen. 

## 2015-02-26 LAB — COMPREHENSIVE METABOLIC PANEL
ALT: 18 IU/L (ref 0–44)
AST: 16 IU/L (ref 0–40)
Albumin/Globulin Ratio: 1.5 (ref 1.1–2.5)
Albumin: 4 g/dL (ref 3.6–4.8)
Alkaline Phosphatase: 184 IU/L — ABNORMAL HIGH (ref 39–117)
BILIRUBIN TOTAL: 0.5 mg/dL (ref 0.0–1.2)
BUN / CREAT RATIO: 16 (ref 10–22)
BUN: 17 mg/dL (ref 8–27)
CHLORIDE: 90 mmol/L — AB (ref 97–108)
CO2: 23 mmol/L (ref 18–29)
CREATININE: 1.08 mg/dL (ref 0.76–1.27)
Calcium: 9.2 mg/dL (ref 8.6–10.2)
GFR calc Af Amer: 84 mL/min/{1.73_m2} (ref >59)
GFR calc non Af Amer: 73 mL/min/{1.73_m2} (ref >59)
Globulin, Total: 2.7 g/dL (ref 1.5–4.5)
Glucose: 466 mg/dL — ABNORMAL HIGH (ref 65–99)
POTASSIUM: 5.3 mmol/L — AB (ref 3.5–5.2)
Sodium: 132 mmol/L — ABNORMAL LOW (ref 134–144)
Total Protein: 6.7 g/dL (ref 6.0–8.5)

## 2015-03-02 ENCOUNTER — Telehealth: Payer: Self-pay | Admitting: *Deleted

## 2015-03-02 NOTE — Telephone Encounter (Signed)
  1. Which medications need to be refilled? Entresto 24/26  2. Which pharmacy is medication to be sent to? [facility]  3. Do they need a 30 day or 90 day supply? 30  4. Would they like a call back once the medication has been sent to the pharmacy? Yes, Has prior auth been completed for this medication?

## 2015-03-02 NOTE — Telephone Encounter (Signed)
Notified patient that the [facility] authorization is in process.

## 2015-03-10 ENCOUNTER — Other Ambulatory Visit: Payer: Self-pay | Admitting: Gastroenterology

## 2015-03-10 ENCOUNTER — Telehealth: Payer: Self-pay | Admitting: *Deleted

## 2015-03-10 NOTE — Telephone Encounter (Signed)
Pt needing PA for Entresto 24/26 mg. PA has been filled and faxed awaiting approval.

## 2015-03-11 ENCOUNTER — Other Ambulatory Visit: Payer: Self-pay | Admitting: *Deleted

## 2015-03-11 MED ORDER — SACUBITRIL-VALSARTAN 24-26 MG PO TABS: 1.0000 | ORAL_TABLET | Freq: Two times a day (BID) | ORAL | Status: DC

## 2015-03-11 NOTE — Telephone Encounter (Signed)
Pt has been approved for Entresto 24/26 mg 03/10/15-09/09/14.

## 2015-03-15 ENCOUNTER — Other Ambulatory Visit: Payer: Self-pay | Admitting: Cardiovascular Disease

## 2015-03-31 ENCOUNTER — Ambulatory Visit (INDEPENDENT_AMBULATORY_CARE_PROVIDER_SITE_OTHER): Payer: BLUE CROSS/BLUE SHIELD | Admitting: Cardiovascular Disease

## 2015-03-31 ENCOUNTER — Encounter: Payer: Self-pay | Admitting: Cardiovascular Disease

## 2015-03-31 VITALS — BP 106/68 | HR 73 | Ht 71.0 in | Wt 277.8 lb

## 2015-03-31 DIAGNOSIS — IMO0002 Reserved for concepts with insufficient information to code with codable children: Secondary | ICD-10-CM

## 2015-03-31 DIAGNOSIS — I4891 Unspecified atrial fibrillation: Secondary | ICD-10-CM | POA: Diagnosis not present

## 2015-03-31 DIAGNOSIS — E669 Obesity, unspecified: Secondary | ICD-10-CM

## 2015-03-31 DIAGNOSIS — E1165 Type 2 diabetes mellitus with hyperglycemia: Secondary | ICD-10-CM

## 2015-03-31 DIAGNOSIS — I2510 Atherosclerotic heart disease of native coronary artery without angina pectoris: Secondary | ICD-10-CM

## 2015-03-31 DIAGNOSIS — E118 Type 2 diabetes mellitus with unspecified complications: Secondary | ICD-10-CM | POA: Diagnosis not present

## 2015-03-31 DIAGNOSIS — Z9861 Coronary angioplasty status: Secondary | ICD-10-CM

## 2015-03-31 DIAGNOSIS — E785 Hyperlipidemia, unspecified: Secondary | ICD-10-CM

## 2015-03-31 DIAGNOSIS — I5022 Chronic systolic (congestive) heart failure: Secondary | ICD-10-CM

## 2015-03-31 MED ORDER — SACUBITRIL-VALSARTAN 49-51 MG PO TABS: 1.0000 | ORAL_TABLET | Freq: Two times a day (BID) | ORAL | Status: DC

## 2015-03-31 MED ORDER — FUROSEMIDE 40 MG PO TABS: 40.0000 mg | ORAL_TABLET | Freq: Two times a day (BID) | ORAL | Status: DC

## 2015-03-31 NOTE — Assessment & Plan Note (Signed)
Maintaining normal sinus rhythm. No changes to his medications

## 2015-03-31 NOTE — Assessment & Plan Note (Signed)
Long discussion with him today  Urging him to call primary care and endocrinology given his high glucose levels.  He reports that he feels well, prior PMD retired.  He is not met his new primary care physician

## 2015-03-31 NOTE — Assessment & Plan Note (Signed)
We have encouraged continued exercise, careful diet management in an effort to lose weight. 

## 2015-03-31 NOTE — Assessment & Plan Note (Signed)
Currently with no symptoms of angina. No further workup at this time. Continue current medication regimen. 

## 2015-03-31 NOTE — Assessment & Plan Note (Signed)
Cholesterol is at goal on the current lipid regimen. No changes to the medications were made.  

## 2015-03-31 NOTE — Patient Instructions (Addendum)
You are doing well.  Please increase the entresto up to 49/51 mg one pill twice a day Please use the coupon  Please get in touch with the diabetes people at [facility], Sugars are running high  Labs in one month (BMP)  Please call us if you have new issues that need to be addressed before your next appt.  Your physician wants you to follow-up in: 3 months.

## 2015-03-31 NOTE — Assessment & Plan Note (Signed)
Recommended we increase the entresto up to the next dose, 50/50 mg by mouth twice a day  Recommended he cause for any symptoms concerning for orthostasis or low blood pressure BMP in one month

## 2015-03-31 NOTE — Progress Notes (Signed)
Patient ID: Colton Lamb, male    DOB: [birth year], 64 y.o.   MRN: [id number]  HPI Comments: Mr. Rodrigue is a 64 year old gentleman with a history of paroxysmal atrial fibrillation,  hyperlipidemia, history of Crohn's ileocolitis, coronary artery disease, PCI x3 to the LAD, circumflex, PDA and PL branch, ejection fraction 45-50% with anterior hypokinesis at that time,  catheterization in 2008  with a cypher stent to the OM, sleep apnea, has not been using CPAP.  He has poorly controlled diabetes, little follow-up with his primary care S/p catheter ablation of atrial flutter and insertion of a DDD ICD. no syncope or ICD shock. He presents today for follow-up of his coronary artery disease, atrial fibrillation  On his last clinic visit, we started him on entresto 25/25 milligrams by mouth twice a day Overall he feels well today with no complaints. No lightheadedness or dizziness Taking his Lasix twice a day him a no leg edema, no shortness of breath. He has not had follow-up with primary care for his diabetes despite our urging.  EKG on today's visit shows normal sinus rhythm with rate 73 bpm, nonspecific ST abnormality   review of recent lab work shows elevated potassium 5.3. He denies taking any potassium supplement  Other past medical history Admitted in April 2015 with atrial flutter, non-ST elevation MI, with cardioversion, cardiac catheterization showing occluded LAD,  moderate distal RCA disease, patent left circumflex.. Medical management recommended.  Severely depressed ejection fraction noted, LifeVest was placed at that time. Ejection fraction 20-25%   hospital admission May 13 with discharge 12/14/2013 with dig toxicity, junctional rhythm, bradycardia with heart rates in the 30s, syncope.  Acute renal failure likely secondary to ATN. Also noted to have TSH of 10. ACE inhibitor, spironolactone, isosorbide was held. Renal function improved at the time of discharge  Followup  echocardiogram 01/29/2014 showing improved ejection fraction up to 30-35%  Allergies  Allergen Reactions  . Iodine Other (See Comments)    Shortness of breath, swelling and hives  . Shrimp [Shellfish Allergy] Other (See Comments)    SWELLING    HIVES    SHORTNESS OF BREATH  . Tetracycline Rash    Current Outpatient Prescriptions on File Prior to Visit  Medication Sig Dispense Refill  . acetaminophen (TYLENOL) 325 MG tablet Take 1-2 tablets (325-650 mg total) by mouth every 4 (four) hours as needed for mild pain.    Marland Kitchen amiodarone (PACERONE) 200 MG tablet Take 1 tablet (200 mg total) by mouth daily.    Marland Kitchen aspirin 81 MG tablet Take 81 mg by mouth daily.     Marland Kitchen atorvastatin (LIPITOR) 80 MG tablet Take 1 tablet (80 mg total) by mouth daily. 30 tablet 6  . balsalazide (COLAZAL) 750 MG capsule TAKE 1 CAPSULE (750 MG TOTAL) BY MOUTH 3 (THREE) TIMES DAILY.(NEED APPOINTMENT FOR FURTHER REFILLS) 90 capsule 0  . carvedilol (COREG) 12.5 MG tablet TAKE 1 TABLET BY MOUTH TWICE A DAY 60 tablet 1  . digoxin (LANOXIN) 0.25 MG tablet TAKE 1 TABLET (0.25 MG TOTAL) BY MOUTH DAILY. 30 tablet 3  . niacin (NIASPAN) 1000 MG CR tablet Take 1,000 mg by mouth at bedtime.    . nitroGLYCERIN (NITROSTAT) 0.4 MG SL tablet Place 0.4 mg under the tongue every 5 (five) minutes as needed (MAX 3 TABLETS). FOR CHEST PAIN    . omeprazole (PRILOSEC) 20 MG capsule Take 20 mg by mouth daily.     Alveda Reasons 20 MG TABS tablet TAKE 1 TABLET BY  MOUTH EVERY DAY WITH LUNCH 30 tablet 3   No current facility-administered medications on file prior to visit.    Past Medical History  Diagnosis Date  . Crohn's ileocolitis   . GERD (gastroesophageal reflux disease)   . Hx of adenomatous colonic polyps 11/2003  . Coronary artery disease     s/p stent placement  . Hypertension   . Obesity   . Atrial fibrillation   . Chronic systolic heart failure     a) EF 20-25%, grade III DD, RV mildly dilated and sys fx mild/mod reduced (10/2013)  .  Ischemic cardiomyopathy     a) LHC (10/2013): LAD stent occlusion, duration unknown. QS pattern across precordium on EKG suggest not acute, moderate distal RCA obstruction and widely patent circumflex coronary artery  . Atrial flutter     a) s/p Cardioversion 11/22/13, on amiodarone and Xarelto  . Dysrhythmia     ATRIAL FIBRILATION  . Syncope 11/2013    in setting of volume depletion and bradycardia due to dig toxicity   . CHF (congestive heart failure)   . Automatic implantable cardioverter-defibrillator in situ   . High cholesterol   . MI (myocardial infarction) 2007; 2015  . Sleep apnea     "have test scheduled for new mask" (04/16/2014)    Past Surgical History  Procedure Laterality Date  . 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 Hydrocele excision Bilateral   . Foot surgery Left     bone spur  . Cardioversion N/A 03/05/2014    Procedure: CARDIOVERSION;  Surgeon: Jolaine Artist, MD;  Location: [facility] ENDOSCOPY;  Service: Cardiovascular;  Laterality: N/A;  . Cardiac defibrillator placement  04/16/2014    Medtronic Evira device  . Cardiac electrophysiology study and ablation  04/16/2014    atrial flutter ablation  . Coronary angioplasty with stent placement  2007; 2008 X 2    "1+1 ~ 1"  . Cardiac catheterization  10/2013  . Left heart catheterization with coronary angiogram N/A 11/22/2013    Procedure: LEFT HEART CATHETERIZATION WITH CORONARY ANGIOGRAM;  Surgeon: Sinclair Grooms, MD;  Location: [facility] CATH LAB;  Service: Cardiovascular;  Laterality: N/A;  . Atrial flutter ablation N/A 04/16/2014    Procedure: ATRIAL FLUTTER ABLATION;  Surgeon: Evans Lance, MD;  Location: [facility] CATH LAB;  Service: Cardiovascular;  Laterality: N/A;  . Implantable cardioverter defibrillator implant N/A 04/16/2014    Procedure: IMPLANTABLE CARDIOVERTER DEFIBRILLATOR IMPLANT;  Surgeon: Evans Lance, MD;  Location: [facility] CATH LAB;  Service: Cardiovascular;  Laterality: N/A;     Social History  reports that he has never smoked. He has never used smokeless tobacco. He reports that he does not drink alcohol or use illicit drugs.  Family History family history includes Breast cancer in his mother; Heart attack in his father; Heart disease in his father. There is no history of Colon cancer.  Review of Systems  Respiratory: Negative.   Cardiovascular: Negative.   Gastrointestinal: Negative.   Musculoskeletal: Negative.   Allergic/Immunologic: Negative.   Neurological: Negative.   Hematological: Negative.   Psychiatric/Behavioral: Negative.   All other systems reviewed and are negative.   BP 106/68 mmHg  Pulse 73  Ht 5' 11"  (1.803 m)  Wt 277 lb 12 oz (125.987 kg)  BMI 38.76 kg/m2  Physical Exam  Constitutional: He is oriented to person, place, and time. He appears well-developed and well-nourished.  Obese  HENT:  Head: Normocephalic.  Nose: Nose normal.  Mouth/Throat: Oropharynx is  clear and moist.  Eyes: Conjunctivae are normal. Pupils are equal, round, and reactive to light.  Neck: Normal range of motion. Neck supple. No JVD present.  Cardiovascular: Normal rate, regular rhythm, S1 normal, S2 normal, normal heart sounds and intact distal pulses.  Exam reveals no gallop and no friction rub.   No murmur heard. Pulmonary/Chest: Effort normal and breath sounds normal. No respiratory distress. He has no wheezes. He has no rales. He exhibits no tenderness.  Abdominal: Soft. Bowel sounds are normal. He exhibits no distension. There is no tenderness.  Musculoskeletal: Normal range of motion. He exhibits no edema or tenderness.  Lymphadenopathy:    He has no cervical adenopathy.  Neurological: He is alert and oriented to person, place, and time. Coordination normal.  Skin: Skin is warm and dry. No rash noted. No erythema.  Psychiatric: He has a normal mood and affect. His behavior is normal. Judgment and thought content normal.      Assessment and  Plan   Nursing note and vitals reviewed.

## 2015-04-07 ENCOUNTER — Encounter: Payer: Self-pay | Admitting: Internal Medicine

## 2015-04-07 ENCOUNTER — Ambulatory Visit (INDEPENDENT_AMBULATORY_CARE_PROVIDER_SITE_OTHER): Payer: BLUE CROSS/BLUE SHIELD | Admitting: Internal Medicine

## 2015-04-07 VITALS — BP 120/82 | HR 82 | Ht 71.0 in | Wt 275.4 lb

## 2015-04-07 DIAGNOSIS — I255 Ischemic cardiomyopathy: Secondary | ICD-10-CM

## 2015-04-07 DIAGNOSIS — Z95810 Presence of automatic (implantable) cardiac defibrillator: Secondary | ICD-10-CM

## 2015-04-07 DIAGNOSIS — I5041 Acute combined systolic (congestive) and diastolic (congestive) heart failure: Secondary | ICD-10-CM

## 2015-04-07 DIAGNOSIS — I480 Paroxysmal atrial fibrillation: Secondary | ICD-10-CM | POA: Diagnosis not present

## 2015-04-07 DIAGNOSIS — I5022 Chronic systolic (congestive) heart failure: Secondary | ICD-10-CM | POA: Diagnosis not present

## 2015-04-07 LAB — CUP PACEART INCLINIC DEVICE CHECK
Brady Statistic AP VP Percent: 0 %
Brady Statistic AP VS Percent: 1.26 %
Brady Statistic AS VP Percent: 0.03 %
Brady Statistic AS VS Percent: 98.7 %
Date Time Interrogation Session: 20160906144917
HighPow Impedance: 171 Ohm
HighPow Impedance: 84 Ohm
Lead Channel Pacing Threshold Amplitude: 1 V
Lead Channel Pacing Threshold Pulse Width: 0.4 ms
Lead Channel Sensing Intrinsic Amplitude: 6.875 mV
Lead Channel Setting Pacing Amplitude: 2.25 V
MDC IDC MSMT BATTERY REMAINING LONGEVITY: 126 mo
MDC IDC MSMT BATTERY VOLTAGE: 3.03 V
MDC IDC MSMT LEADCHNL RA IMPEDANCE VALUE: 494 Ohm
MDC IDC MSMT LEADCHNL RA PACING THRESHOLD AMPLITUDE: 0.5 V
MDC IDC MSMT LEADCHNL RA PACING THRESHOLD PULSEWIDTH: 0.4 ms
MDC IDC MSMT LEADCHNL RA SENSING INTR AMPL: 3.25 mV
MDC IDC MSMT LEADCHNL RV IMPEDANCE VALUE: 399 Ohm
MDC IDC SET LEADCHNL RA PACING AMPLITUDE: 1.5 V
MDC IDC SET LEADCHNL RV PACING PULSEWIDTH: 0.4 ms
MDC IDC SET LEADCHNL RV SENSING SENSITIVITY: 0.3 mV
MDC IDC SET ZONE DETECTION INTERVAL: 300 ms
MDC IDC SET ZONE DETECTION INTERVAL: 360 ms
MDC IDC STAT BRADY RA PERCENT PACED: 1.27 %
MDC IDC STAT BRADY RV PERCENT PACED: 0.03 %
Zone Setting Detection Interval: 350 ms
Zone Setting Detection Interval: 350 ms

## 2015-04-07 NOTE — Progress Notes (Signed)
HPI [name] returns today for followup. He is a very pleasant 64 year old man with morbid obesity, chronic systolic heart failure, atrial flutter, and hypertension. He underwent catheter ablation of atrial flutter and insertion of a DDD ICD over a year ago. In the interim, he has been stable. He denies palpitations, chest pain, shortness of breath is much improved. His appetite is good. He has had no syncope or ICD shock. He does admit to being sedentary.  Allergies  Allergen Reactions  . Iodine Other (See Comments)    Shortness of breath, swelling and hives  . Shrimp [Shellfish Allergy] Other (See Comments)    SWELLING    HIVES    SHORTNESS OF BREATH  . Tetracycline Rash     Current Outpatient Prescriptions  Medication Sig Dispense Refill  . acetaminophen (TYLENOL) 325 MG tablet Take 1-2 tablets (325-650 mg total) by mouth every 4 (four) hours as needed for mild pain.    Marland Kitchen amiodarone (PACERONE) 200 MG tablet Take 1 tablet (200 mg total) by mouth daily.    Marland Kitchen aspirin 81 MG tablet Take 81 mg by mouth daily.     Marland Kitchen atorvastatin (LIPITOR) 80 MG tablet Take 1 tablet (80 mg total) by mouth daily. 30 tablet 6  . balsalazide (COLAZAL) 750 MG capsule TAKE 1 CAPSULE (750 MG TOTAL) BY MOUTH 3 (THREE) TIMES DAILY.(NEED APPOINTMENT FOR FURTHER REFILLS) 90 capsule 0  . carvedilol (COREG) 12.5 MG tablet TAKE 1 TABLET BY MOUTH TWICE A DAY 60 tablet 1  . digoxin (LANOXIN) 0.25 MG tablet TAKE 1 TABLET (0.25 MG TOTAL) BY MOUTH DAILY. 30 tablet 3  . furosemide (LASIX) 40 MG tablet Take 1 tablet (40 mg total) by mouth 2 (two) times daily. 60 tablet 11  . niacin (NIASPAN) 1000 MG CR tablet Take 1,000 mg by mouth at bedtime.    . nitroGLYCERIN (NITROSTAT) 0.4 MG SL tablet Place 0.4 mg under the tongue every 5 (five) minutes as needed (MAX 3 TABLETS). FOR CHEST PAIN    . omeprazole (PRILOSEC) 20 MG capsule Take 20 mg by mouth daily.     . sacubitril-valsartan (ENTRESTO) 49-51 MG Take 1 tablet by mouth  2 (two) times daily. 60 tablet 11  . XARELTO 20 MG TABS tablet TAKE 1 TABLET BY MOUTH EVERY DAY WITH LUNCH 30 tablet 3   No current facility-administered medications for this visit.     Past Medical History  Diagnosis Date  . Crohn's ileocolitis   . GERD (gastroesophageal reflux disease)   . Hx of adenomatous colonic polyps 11/2003  . Coronary artery disease     s/p stent placement  . Hypertension   . Obesity   . Atrial fibrillation   . Chronic systolic heart failure     a) EF 20-25%, grade III DD, RV mildly dilated and sys fx mild/mod reduced (10/2013)  . Ischemic cardiomyopathy     a) LHC (10/2013): LAD stent occlusion, duration unknown. QS pattern across precordium on EKG suggest not acute, moderate distal RCA obstruction and widely patent circumflex coronary artery  . Atrial flutter     a) s/p Cardioversion 11/22/13, on amiodarone and Xarelto  . Dysrhythmia     ATRIAL FIBRILATION  . Syncope 11/2013    in setting of volume depletion and bradycardia due to dig toxicity   . CHF (congestive heart failure)   . Automatic implantable cardioverter-defibrillator in situ   . High cholesterol   . MI (myocardial infarction) 2007; 2015  . Sleep  apnea     "have test scheduled for new mask" (04/16/2014)    ROS:   All systems reviewed and negative except as noted in the HPI.   Past Surgical History  Procedure Laterality Date  . 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 Hydrocele excision Bilateral   . Foot surgery Left     bone spur  . Cardioversion N/A 03/05/2014    Procedure: CARDIOVERSION;  Surgeon: Jolaine Artist, MD;  Location: [facility] ENDOSCOPY;  Service: Cardiovascular;  Laterality: N/A;  . Cardiac defibrillator placement  04/16/2014    Medtronic Evira device  . Cardiac electrophysiology study and ablation  04/16/2014    atrial flutter ablation  . Coronary angioplasty with stent placement  2007; 2008 X 2    "1+1 ~ 1"  . Cardiac catheterization   10/2013  . Left heart catheterization with coronary angiogram N/A 11/22/2013    Procedure: LEFT HEART CATHETERIZATION WITH CORONARY ANGIOGRAM;  Surgeon: Sinclair Grooms, MD;  Location: [facility] CATH LAB;  Service: Cardiovascular;  Laterality: N/A;  . Atrial flutter ablation N/A 04/16/2014    Procedure: ATRIAL FLUTTER ABLATION;  Surgeon: Evans Lance, MD;  Location: [facility] CATH LAB;  Service: Cardiovascular;  Laterality: N/A;  . Implantable cardioverter defibrillator implant N/A 04/16/2014    Procedure: IMPLANTABLE CARDIOVERTER DEFIBRILLATOR IMPLANT;  Surgeon: Evans Lance, MD;  Location: [facility] CATH LAB;  Service: Cardiovascular;  Laterality: N/A;     Family History  Problem Relation Age of Onset  . Breast cancer Mother   . Heart disease Father   . Colon cancer Neg Hx   . Heart attack Father      Social History   Social History  . Marital Status: Widowed    Spouse Name: N/A  . Number of Children: 1  . Years of Education: N/A   Occupational History  . retired    Social History Main Topics  . Smoking status: Never Smoker   . Smokeless tobacco: Never Used  . Alcohol Use: No  . Drug Use: No  . Sexual Activity: No   Other Topics Concern  . Not on file   Social History Narrative     BP 120/82 mmHg  Pulse 82  Ht 5' 11"  (1.803 m)  Wt 275 lb 6.4 oz (124.921 kg)  BMI 38.43 kg/m2  Physical Exam:  Well appearing middle-aged man, obese, NAD HEENT: Unremarkable Neck:  7 cm JVD, no thyromegally Back:  No CVA tenderness Lungs:  Clear with no wheezes, rales, or rhonchi. HEART:  Regular rate rhythm, no murmurs, no rubs, no clicks Abd:  soft, positive bowel sounds, no organomegally, no rebound, no guarding Ext:  2 plus pulses, no edema, no cyanosis, no clubbing Skin:  No rashes no nodules Neuro:  CN II through XII intact, motor grossly intact  DEVICE  Normal device function.  See PaceArt for details.   Assess/Plan:

## 2015-04-07 NOTE — Assessment & Plan Note (Signed)
His symptoms are class 2. He will continue his current meds. He will maintain a low sodium diet.

## 2015-04-07 NOTE — Patient Instructions (Addendum)
Medication Instructions:  Your physician has recommended you make the following change in your medication:  1) Reduce your Amiodarone dosage - Take 1 tablet (200 mg) by mouth Monday-Friday and take 1/2 tablet (100 mg) by mouth on Saturday & Sunday     Labwork: None ordered  Testing/Procedures: None ordered  Follow-Up: Your physician wants you to follow-up in: 12 months with Dr Knox Saliva will receive a reminder letter in the mail two months in advance. If you don't receive a letter, please call our office to schedule the follow-up appointment.   Remote monitoring is used to monitor your  ICD from home. This monitoring reduces the number of office visits required to check your device to one time per year. It allows [place] to keep an eye on the functioning of your device to ensure it is working properly. You are scheduled for a device check from home on 07/07/15. You may send your transmission at any time that day. If you have a wireless device, the transmission will be sent automatically. After your physician reviews your transmission, you will receive a postcard with your next transmission date.    Any Other Special Instructions Will Be Listed Below (If Applicable).

## 2015-04-07 NOTE — Assessment & Plan Note (Signed)
I have strongly encouraged the patient to lose weight. He will try and increase his physical activity. He states he is going to try golf and increase his activity level

## 2015-04-07 NOTE — Assessment & Plan Note (Signed)
He is maintaining NSR. He will continue his anti-coagulation and I have asked him to reduce his does of amiodarone.

## 2015-04-10 ENCOUNTER — Ambulatory Visit (INDEPENDENT_AMBULATORY_CARE_PROVIDER_SITE_OTHER): Payer: BLUE CROSS/BLUE SHIELD | Admitting: Gastroenterology

## 2015-04-10 ENCOUNTER — Encounter: Payer: Self-pay | Admitting: Gastroenterology

## 2015-04-10 VITALS — BP 114/78 | HR 88 | Ht 71.0 in | Wt 278.2 lb

## 2015-04-10 DIAGNOSIS — I5022 Chronic systolic (congestive) heart failure: Secondary | ICD-10-CM

## 2015-04-10 DIAGNOSIS — K5080 Crohn's disease of both small and large intestine without complications: Secondary | ICD-10-CM | POA: Diagnosis not present

## 2015-04-10 DIAGNOSIS — K219 Gastro-esophageal reflux disease without esophagitis: Secondary | ICD-10-CM | POA: Diagnosis not present

## 2015-04-10 DIAGNOSIS — Z7901 Long term (current) use of anticoagulants: Secondary | ICD-10-CM

## 2015-04-10 MED ORDER — BALSALAZIDE DISODIUM 750 MG PO CAPS: ORAL_CAPSULE | ORAL | Status: DC

## 2015-04-10 NOTE — Progress Notes (Signed)
    History of Present Illness: This is a 64 year old male with Crohn's disease and GERD returning for follow-up. He had several significant cardiac events over the past year including angioplasty, atrial fibrillation ablation, ICD placement. He is currently stable from a cardiac standpoint. He was evaluated by Dr. Lovena Le on September 6. Last echocardiogram showed an ejection fraction of 30-35%. He did not proceed with recommended 5 year interval colonoscopy in 05/2014 due to his cardiac problems. He has no gastrointestinal complaints. Denies weight loss, abdominal pain, constipation, diarrhea, change in stool caliber, melena, hematochezia, nausea, vomiting, dysphagia, reflux symptoms, chest pain.   Current Medications, Allergies, Past Medical History, Past Surgical History, Family History and Social History were reviewed in [employer] record.  Physical Exam: General: Well developed , well nourished, no acute distress Head: Normocephalic and atraumatic Eyes:  sclerae anicteric, EOMI Ears: Normal auditory acuity Mouth: No deformity or lesions Lungs: Clear throughout to auscultation Heart: Regular rate and rhythm; no murmurs, rubs or bruits Abdomen: Soft, non tender and non distended. No masses, hepatosplenomegaly or hernias noted. Normal Bowel sounds Musculoskeletal: Symmetrical with no gross deformities  Pulses:  Normal pulses noted Extremities: No clubbing, cyanosis, edema or deformities noted Neurological: Alert oriented x 4, grossly nonfocal Psychological:  Alert and cooperative. Normal mood and affect  Assessment and Recommendations:  1. Crohn's ileocolitis. Stable. Continue balsalazide 750 mg 3 times a day. REV in 6 months.   2. Personal history of adenomatous colon polyps. Overdue for surveillance colonoscopy due to cardiac events. Was due in October 2015 and he wants to delay colonoscopy until 09/2015.   3. GERD. Continue standard antireflux measures and  omeprazole 20 mg daily.  4. Chronic anticoagulation with Louanna Raw for afib.   5. Ischemic CM with CHF, EF 30-35%. ICD placed 04/2014. CAD. History of MI.

## 2015-04-10 NOTE — Patient Instructions (Addendum)
We have sent the following medications to your pharmacy for you to pick up at your convenience: Balsalazide   Please follow up in 6 month for office visit Colonoscopy recall due March 2017

## 2015-04-21 ENCOUNTER — Other Ambulatory Visit: Payer: Self-pay | Admitting: Cardiovascular Disease

## 2015-04-23 ENCOUNTER — Other Ambulatory Visit (INDEPENDENT_AMBULATORY_CARE_PROVIDER_SITE_OTHER): Payer: BLUE CROSS/BLUE SHIELD | Admitting: *Deleted

## 2015-04-23 DIAGNOSIS — I4891 Unspecified atrial fibrillation: Secondary | ICD-10-CM | POA: Diagnosis not present

## 2015-04-24 LAB — BASIC METABOLIC PANEL
BUN / CREAT RATIO: 13 (ref 10–22)
BUN: 14 mg/dL (ref 8–27)
CALCIUM: 9 mg/dL (ref 8.6–10.2)
CHLORIDE: 91 mmol/L — AB (ref 97–108)
CO2: 22 mmol/L (ref 18–29)
Creatinine, Ser: 1.05 mg/dL (ref 0.76–1.27)
GFR calc Af Amer: 86 mL/min/{1.73_m2} (ref >59)
GFR calc non Af Amer: 75 mL/min/{1.73_m2} (ref >59)
GLUCOSE: 593 mg/dL — AB (ref 65–99)
Potassium: 4.8 mmol/L (ref 3.5–5.2)
Sodium: 131 mmol/L — ABNORMAL LOW (ref 134–144)

## 2015-07-01 ENCOUNTER — Encounter: Payer: Self-pay | Admitting: Cardiovascular Disease

## 2015-07-01 ENCOUNTER — Ambulatory Visit (INDEPENDENT_AMBULATORY_CARE_PROVIDER_SITE_OTHER): Payer: BLUE CROSS/BLUE SHIELD | Admitting: Cardiovascular Disease

## 2015-07-01 VITALS — BP 124/80 | HR 82 | Ht 71.0 in | Wt 294.5 lb

## 2015-07-01 DIAGNOSIS — E871 Hypo-osmolality and hyponatremia: Secondary | ICD-10-CM | POA: Insufficient documentation

## 2015-07-01 DIAGNOSIS — IMO0002 Reserved for concepts with insufficient information to code with codable children: Secondary | ICD-10-CM

## 2015-07-01 DIAGNOSIS — Z95810 Presence of automatic (implantable) cardiac defibrillator: Secondary | ICD-10-CM

## 2015-07-01 DIAGNOSIS — E1165 Type 2 diabetes mellitus with hyperglycemia: Secondary | ICD-10-CM

## 2015-07-01 DIAGNOSIS — I5022 Chronic systolic (congestive) heart failure: Secondary | ICD-10-CM | POA: Diagnosis not present

## 2015-07-01 DIAGNOSIS — R609 Edema, unspecified: Secondary | ICD-10-CM | POA: Diagnosis not present

## 2015-07-01 DIAGNOSIS — I480 Paroxysmal atrial fibrillation: Secondary | ICD-10-CM

## 2015-07-01 DIAGNOSIS — E118 Type 2 diabetes mellitus with unspecified complications: Secondary | ICD-10-CM

## 2015-07-01 MED ORDER — TORSEMIDE 20 MG PO TABS: 40.0000 mg | ORAL_TABLET | Freq: Two times a day (BID) | ORAL | Status: DC

## 2015-07-01 NOTE — Progress Notes (Signed)
Patient ID: Colton Lamb, male    DOB: 26-Mar-1951, 64 y.o.   MRN: [id number]  HPI Comments: Mr. Sporn is a 64 year old gentleman with a history of paroxysmal atrial fibrillation,  hyperlipidemia, history of Crohn's ileocolitis, coronary artery disease, PCI x3 to the LAD, circumflex, PDA and PL branch, ejection fraction 45-50% with anterior hypokinesis at that time,  catheterization in 2008  with a cypher stent to the OM, sleep apnea, has not been using CPAP.  He has poorly controlled diabetes, little follow-up with his primary care S/p catheter ablation of atrial flutter and insertion of a DDD ICD. no syncope or ICD shock. He presents today for follow-up of his coronary artery disease, atrial fibrillation  In follow-up today, his weight has increased from 277 pounds which is his baseline, now 295 pounds He does have worsening lower extremity edema, denies abdominal swelling or shortness of breath He has been consistent with his Lasix 40 mg twice a day Otherwise denies any symptoms  Review of lab work with him shows slow steady decline of his sodium from 137, most recently 129 at the beginning of October 2016 He is tolerating entresto twice a day Denies any lightheadedness or dizziness.  Reports that he is working with primary care on his diabetes, starting a new medication once he gets cleared by insurance  Other past medical history Admitted in April 2015 with atrial flutter, non-ST elevation MI, with cardioversion, cardiac catheterization showing occluded LAD,  moderate distal RCA disease, patent left circumflex.. Medical management recommended.  Severely depressed ejection fraction noted, LifeVest was placed at that time. Ejection fraction 20-25%   hospital admission May 13 with discharge 12/14/2013 with dig toxicity, junctional rhythm, bradycardia with heart rates in the 30s, syncope.  Acute renal failure likely secondary to ATN. Also noted to have TSH of 10. ACE inhibitor,  spironolactone, isosorbide was held. Renal function improved at the time of discharge  Followup echocardiogram 01/29/2014 showing improved ejection fraction up to 30-35%  Allergies  Allergen Reactions  . Iodine Other (See Comments)    Shortness of breath, swelling and hives  . Shrimp [Shellfish Allergy] Other (See Comments)    SWELLING    HIVES    SHORTNESS OF BREATH  . Tetracycline Rash    Current Outpatient Prescriptions on File Prior to Visit  Medication Sig Dispense Refill  . acetaminophen (TYLENOL) 325 MG tablet Take 1-2 tablets (325-650 mg total) by mouth every 4 (four) hours as needed for mild pain.    Marland Kitchen amiodarone (PACERONE) 200 MG tablet Take 1 tablet by mouth Mon.-Friday and 1/2 tablet by mouth Saturday and Sunday. 30 tablet 3  . aspirin 81 MG tablet Take 81 mg by mouth daily.     Marland Kitchen atorvastatin (LIPITOR) 80 MG tablet Take 1 tablet (80 mg total) by mouth daily. 30 tablet 6  . balsalazide (COLAZAL) 750 MG capsule TAKE 1 CAPSULE (750 MG TOTAL) BY MOUTH 3 (THREE) TIMES DAILY. 90 capsule 5  . carvedilol (COREG) 12.5 MG tablet TAKE 1 TABLET BY MOUTH TWICE A DAY 60 tablet 1  . niacin (NIASPAN) 1000 MG CR tablet Take 1,000 mg by mouth at bedtime.    . nitroGLYCERIN (NITROSTAT) 0.4 MG SL tablet Place 0.4 mg under the tongue every 5 (five) minutes as needed (MAX 3 TABLETS). FOR CHEST PAIN    . omeprazole (PRILOSEC) 20 MG capsule Take 20 mg by mouth daily.     . sacubitril-valsartan (ENTRESTO) 49-51 MG Take 1 tablet by mouth 2 (two)  times daily. 60 tablet 11  . XARELTO 20 MG TABS tablet TAKE 1 TABLET BY MOUTH EVERY DAY WITH LUNCH 30 tablet 3   No current facility-administered medications on file prior to visit.    Past Medical History  Diagnosis Date  . Crohn's ileocolitis ([place])   . GERD (gastroesophageal reflux disease)   . Hx of adenomatous colonic polyps 11/2003  . Coronary artery disease     s/p stent placement  . Hypertension   . Obesity   . Atrial fibrillation ([place])   .  Chronic systolic heart failure (HCC)     a) EF 20-25%, grade III DD, RV mildly dilated and sys fx mild/mod reduced (10/2013)  . Ischemic cardiomyopathy     a) LHC (10/2013): LAD stent occlusion, duration unknown. QS pattern across precordium on EKG suggest not acute, moderate distal RCA obstruction and widely patent circumflex coronary artery  . Atrial flutter ([place])     a) s/p Cardioversion 11/22/13, on amiodarone and Xarelto  . Dysrhythmia     ATRIAL FIBRILATION  . Syncope 11/2013    in setting of volume depletion and bradycardia due to dig toxicity   . CHF (congestive heart failure) ([place])   . Automatic implantable cardioverter-defibrillator in situ   . High cholesterol   . MI (myocardial infarction) ([place]) 2007; 2015  . Sleep apnea     "have test scheduled for new mask" (04/16/2014)  . Diabetes mellitus without complication [facility])     Past Surgical History  Procedure Laterality Date  . 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 Hydrocele excision Bilateral   . Foot surgery Left     bone spur  . Cardioversion N/A 03/05/2014    Procedure: CARDIOVERSION;  Surgeon: Jolaine Artist, MD;  Location: [facility] ENDOSCOPY;  Service: Cardiovascular;  Laterality: N/A;  . Cardiac defibrillator placement  04/16/2014    Medtronic Evira device  . Cardiac electrophysiology study and ablation  04/16/2014    atrial flutter ablation  . Coronary angioplasty with stent placement  2007; 2008 X 2    "1+1 ~ 1"  . Cardiac catheterization  10/2013  . Left heart catheterization with coronary angiogram N/A 11/22/2013    Procedure: LEFT HEART CATHETERIZATION WITH CORONARY ANGIOGRAM;  Surgeon: Sinclair Grooms, MD;  Location: [facility] CATH LAB;  Service: Cardiovascular;  Laterality: N/A;  . Atrial flutter ablation N/A 04/16/2014    Procedure: ATRIAL FLUTTER ABLATION;  Surgeon: Evans Lance, MD;  Location: [facility] CATH LAB;  Service: Cardiovascular;  Laterality: N/A;  . Implantable cardioverter  defibrillator implant N/A 04/16/2014    Procedure: IMPLANTABLE CARDIOVERTER DEFIBRILLATOR IMPLANT;  Surgeon: Evans Lance, MD;  Location: [facility] CATH LAB;  Service: Cardiovascular;  Laterality: N/A;    Social History  reports that he has never smoked. He has never used smokeless tobacco. He reports that he does not drink alcohol or use illicit drugs.  Family History family history includes Breast cancer in his mother; Heart attack in his father; Heart disease in his father. There is no history of Colon cancer.  Review of Systems  Respiratory: Negative.   Cardiovascular: Positive for leg swelling.  Gastrointestinal: Negative.   Musculoskeletal: Negative.   Allergic/Immunologic: Negative.   Neurological: Negative.   Hematological: Negative.   Psychiatric/Behavioral: Negative.   All other systems reviewed and are negative.   BP 124/80 mmHg  Pulse 82  Ht 5' 11"  (1.803 m)  Wt 294 lb 8 oz (133.584 kg)  BMI 41.09 kg/m2  Physical Exam  Constitutional: He is oriented to person, place, and time. He appears well-developed and well-nourished.  Obese  HENT:  Head: Normocephalic.  Nose: Nose normal.  Mouth/Throat: Oropharynx is clear and moist.  Eyes: Conjunctivae are normal. Pupils are equal, round, and reactive to light.  Neck: Normal range of motion. Neck supple. No JVD present.  Cardiovascular: Normal rate, regular rhythm, S1 normal, S2 normal, normal heart sounds and intact distal pulses.  Exam reveals no gallop and no friction rub.   No murmur heard. Trace to 1+ pitting edema to below the knees  Pulmonary/Chest: Effort normal and breath sounds normal. No respiratory distress. He has no wheezes. He has no rales. He exhibits no tenderness.  Abdominal: Soft. Bowel sounds are normal. He exhibits no distension. There is no tenderness.  Musculoskeletal: Normal range of motion. He exhibits no edema or tenderness.  Lymphadenopathy:    He has no cervical adenopathy.  Neurological: He is  alert and oriented to person, place, and time. Coordination normal.  Skin: Skin is warm and dry. No rash noted. No erythema.  Psychiatric: He has a normal mood and affect. His behavior is normal. Judgment and thought content normal.      Assessment and Plan   Nursing note and vitals reviewed.

## 2015-07-01 NOTE — Assessment & Plan Note (Signed)
Worsening edema, dramatic weight gain, medication changes as above with close follow-up

## 2015-07-01 NOTE — Assessment & Plan Note (Signed)
Weight is up over 15 pounds from his prior clinic visit Recommended he hold his Lasix, start torsemide 40 mg twice a day No change to entresto at this time, will check BMP given low sodium in October

## 2015-07-01 NOTE — Assessment & Plan Note (Signed)
Management primary care

## 2015-07-01 NOTE — Patient Instructions (Addendum)
Weight is up 17 pounds from 8/16  Hold the lasix Start torsemide 40 mg twice a day Weight daily For weight <280 pounds, change to torsemide 40 mg daily Weight >280 lbs, take torsemide 40 mg twice a day  We will check a BMP today   Please call us if you have new issues that need to be addressed before your next appt.  Your physician wants you to follow-up in: 1 months.

## 2015-07-01 NOTE — Assessment & Plan Note (Signed)
We have encouraged continued exercise, careful diet management in an effort to lose weight. 

## 2015-07-01 NOTE — Assessment & Plan Note (Signed)
Followed by Dr. Taylor 

## 2015-07-01 NOTE — Assessment & Plan Note (Signed)
Sodium 129 through primary care October 2016 We will recheck again today, slow steady decline over the past several months

## 2015-07-01 NOTE — Assessment & Plan Note (Signed)
Maintaining normal sinus rhythm We'll continue current medications, amiodarone, carvedilol

## 2015-07-02 LAB — BASIC METABOLIC PANEL
BUN/Creatinine Ratio: 14 (ref 10–22)
BUN: 12 mg/dL (ref 8–27)
CALCIUM: 8.6 mg/dL (ref 8.6–10.2)
CO2: 23 mmol/L (ref 18–29)
CREATININE: 0.87 mg/dL (ref 0.76–1.27)
Chloride: 99 mmol/L (ref 97–106)
GFR calc Af Amer: 105 mL/min/{1.73_m2} (ref >59)
GFR, EST NON AFRICAN AMERICAN: 91 mL/min/{1.73_m2} (ref >59)
Glucose: 339 mg/dL — ABNORMAL HIGH (ref 65–99)
POTASSIUM: 4 mmol/L (ref 3.5–5.2)
Sodium: 139 mmol/L (ref 136–144)

## 2015-07-07 ENCOUNTER — Ambulatory Visit (INDEPENDENT_AMBULATORY_CARE_PROVIDER_SITE_OTHER): Payer: BLUE CROSS/BLUE SHIELD | Admitting: *Deleted

## 2015-07-07 DIAGNOSIS — I255 Ischemic cardiomyopathy: Secondary | ICD-10-CM

## 2015-07-07 DIAGNOSIS — I5022 Chronic systolic (congestive) heart failure: Secondary | ICD-10-CM | POA: Diagnosis not present

## 2015-07-07 NOTE — Progress Notes (Signed)
Remote ICD transmission.   

## 2015-07-15 LAB — CUP PACEART REMOTE DEVICE CHECK
Brady Statistic AP VP Percent: 0 %
Brady Statistic AP VS Percent: 0.77 %
Brady Statistic AS VP Percent: 0.03 %
Brady Statistic AS VS Percent: 99.2 %
HighPow Impedance: 69 Ohm
Implantable Lead Implant Date: 20150916
Implantable Lead Location: 753859
Lead Channel Impedance Value: 399 Ohm
Lead Channel Pacing Threshold Amplitude: 0.5 V
Lead Channel Pacing Threshold Amplitude: 1.25 V
Lead Channel Pacing Threshold Pulse Width: 0.4 ms
Lead Channel Sensing Intrinsic Amplitude: 2.625 mV
Lead Channel Sensing Intrinsic Amplitude: 7.25 mV
Lead Channel Sensing Intrinsic Amplitude: 7.25 mV
Lead Channel Setting Pacing Amplitude: 1.5 V
Lead Channel Setting Pacing Amplitude: 2.5 V
Lead Channel Setting Pacing Pulse Width: 0.4 ms
MDC IDC LEAD IMPLANT DT: 20150916
MDC IDC LEAD LOCATION: 753860
MDC IDC LEAD MODEL: 6935
MDC IDC MSMT BATTERY REMAINING LONGEVITY: 124 mo
MDC IDC MSMT BATTERY VOLTAGE: 3.02 V
MDC IDC MSMT LEADCHNL RA PACING THRESHOLD PULSEWIDTH: 0.4 ms
MDC IDC MSMT LEADCHNL RA SENSING INTR AMPL: 2.625 mV
MDC IDC MSMT LEADCHNL RV IMPEDANCE VALUE: 304 Ohm
MDC IDC MSMT LEADCHNL RV IMPEDANCE VALUE: 323 Ohm
MDC IDC SESS DTM: 20161206123825
MDC IDC SET LEADCHNL RV SENSING SENSITIVITY: 0.3 mV
MDC IDC STAT BRADY RA PERCENT PACED: 0.78 %
MDC IDC STAT BRADY RV PERCENT PACED: 0.03 %

## 2015-07-22 ENCOUNTER — Encounter: Payer: Self-pay | Admitting: Cardiology

## 2015-07-24 ENCOUNTER — Other Ambulatory Visit: Payer: Self-pay | Admitting: Internal Medicine

## 2015-07-26 ENCOUNTER — Other Ambulatory Visit: Payer: Self-pay | Admitting: Cardiovascular Disease

## 2015-07-28 ENCOUNTER — Other Ambulatory Visit: Payer: Self-pay | Admitting: Cardiovascular Disease

## 2015-08-06 ENCOUNTER — Encounter: Payer: Self-pay | Admitting: Nurse Practitioner

## 2015-08-06 ENCOUNTER — Ambulatory Visit (INDEPENDENT_AMBULATORY_CARE_PROVIDER_SITE_OTHER): Payer: BLUE CROSS/BLUE SHIELD | Admitting: Nurse Practitioner

## 2015-08-06 VITALS — BP 122/76 | HR 76 | Ht 71.0 in | Wt 289.0 lb

## 2015-08-06 DIAGNOSIS — I2510 Atherosclerotic heart disease of native coronary artery without angina pectoris: Secondary | ICD-10-CM

## 2015-08-06 DIAGNOSIS — I110 Hypertensive heart disease with heart failure: Secondary | ICD-10-CM

## 2015-08-06 DIAGNOSIS — I255 Ischemic cardiomyopathy: Secondary | ICD-10-CM | POA: Diagnosis not present

## 2015-08-06 DIAGNOSIS — I5042 Chronic combined systolic (congestive) and diastolic (congestive) heart failure: Secondary | ICD-10-CM

## 2015-08-06 DIAGNOSIS — E1159 Type 2 diabetes mellitus with other circulatory complications: Secondary | ICD-10-CM | POA: Diagnosis not present

## 2015-08-06 DIAGNOSIS — I509 Heart failure, unspecified: Secondary | ICD-10-CM | POA: Diagnosis not present

## 2015-08-06 DIAGNOSIS — E119 Type 2 diabetes mellitus without complications: Secondary | ICD-10-CM | POA: Insufficient documentation

## 2015-08-06 DIAGNOSIS — I480 Paroxysmal atrial fibrillation: Secondary | ICD-10-CM | POA: Diagnosis not present

## 2015-08-06 NOTE — Progress Notes (Signed)
Office Visit    Patient Name: Colton Lamb Date of Encounter: 08/06/2015  Primary Care Provider:  Mendel Ryder, MD Primary Cardiologist:  Johnny Bridge, MD   Chief Complaint    65 y/o male with a h/o CAD/ICM and CHF, who presents for f/u.  Past Medical History    Past Medical History  Diagnosis Date  . Crohn's ileocolitis ([place])   . GERD (gastroesophageal reflux disease)   . Hx of adenomatous colonic polyps 11/2003  . Coronary artery disease     a. s/p MI 2007/2015;  b. s/p prior PCI to the LAD/LCX/PDA/PL;  c. 2008: s/p Cypher DES to the OM.  Marland Kitchen Hypertension   . Obesity   . Paroxysmal atrial fibrillation (HCC)     a. CHA2DS2VASc = 4-->xarelto/amio.  . Chronic systolic heart failure ([place])     a. 10/2013 EF 20-25%, grade III DD, RV mildly dilated and sys fx mild/mod reduced;  b. 01/2014 Echo: EF 30-35%, gr3 DD, mod dil LA.  . Ischemic cardiomyopathy     s. 01/2014 s/p MDT [id number] [name] XT DR single lead AICD.  Marland Kitchen Atrial flutter ([place])     a. s/p Cardioversion 11/22/13, on amiodarone and Xarelto.  . Syncope     a.  11/2013 in setting of volume depletion and bradycardia due to dig toxicity   . Hyperlipidemia   . Sleep apnea   . Type II diabetes mellitus [facility])    Past Surgical History  Procedure Laterality Date  . 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 Hydrocele excision Bilateral   . Foot surgery Left     bone spur  . Cardioversion N/A 03/05/2014    Procedure: CARDIOVERSION;  Surgeon: Jolaine Artist, MD;  Location: [facility] ENDOSCOPY;  Service: Cardiovascular;  Laterality: N/A;  . Cardiac defibrillator placement  04/16/2014    Medtronic Evira device  . Cardiac electrophysiology study and ablation  04/16/2014    atrial flutter ablation  . Coronary angioplasty with stent placement  2007; 2008 X 2    "1+1 ~ 1"  . Cardiac catheterization  10/2013  . Left heart catheterization with coronary angiogram N/A 11/22/2013    Procedure: LEFT HEART  CATHETERIZATION WITH CORONARY ANGIOGRAM;  Surgeon: Sinclair Grooms, MD;  Location: [facility] CATH LAB;  Service: Cardiovascular;  Laterality: N/A;  . Atrial flutter ablation N/A 04/16/2014    Procedure: ATRIAL FLUTTER ABLATION;  Surgeon: Evans Lance, MD;  Location: [facility] CATH LAB;  Service: Cardiovascular;  Laterality: N/A;  . Implantable cardioverter defibrillator implant N/A 04/16/2014    Procedure: IMPLANTABLE CARDIOVERTER DEFIBRILLATOR IMPLANT;  Surgeon: Evans Lance, MD;  Location: [facility] CATH LAB;  Service: Cardiovascular;  Laterality: N/A;    Allergies  Allergies  Allergen Reactions  . Iodine Other (See Comments)    Shortness of breath, swelling and hives  . Shrimp [Shellfish Allergy] Other (See Comments)    SWELLING    HIVES    SHORTNESS OF BREATH  . Tetracycline Rash    History of Present Illness    65 year old male with prior history of CAD status post prior PCI, ischemic cardiomyopathy status post ICD, chronic systolic and diastolic CHF, diabetes, hypertension, and morbid obesity.  He was last seen in clinic in 06/2015 @ which time his wt was up and he was noted to have increased LEE.  His torsemide was increased to 40 BID.  Since then, he has done well with less edema and 6 lbs wt loss.  He says that he is relatively active and is able to complete routine chores w/o significant dyspnea.  Further, He denies chest pain, palpitations, pnd, orthopnea, n, v, dizziness, syncope, weight gain, or early satiety.  He has been compliant with his meds but does report eating at least 1 fast food meal daily and also eating cold cuts on a regular basis.  He says that he doesn't add salt to food though.   Home Medications    Prior to Admission medications   Medication Sig Start Date End Date Taking? Authorizing Provider  acetaminophen (TYLENOL) 325 MG tablet Take 1-2 tablets (325-650 mg total) by mouth every 4 (four) hours as needed for mild pain. 04/17/14  Yes [name], NP  amiodarone (PACERONE)  200 MG tablet TAKE 1 TABLET BY MOUTH MON.-FRIDAY AND 1/2 TABLET BY MOUTH SATURDAY AND SUNDAY. 07/24/15  Yes Evans Lance, MD  aspirin 81 MG tablet Take 81 mg by mouth daily.    Yes Historical Provider, MD  atorvastatin (LIPITOR) 80 MG tablet Take 1 tablet (80 mg total) by mouth daily. 01/26/15  Yes Minna Merritts, MD  balsalazide (COLAZAL) 750 MG capsule TAKE 1 CAPSULE (750 MG TOTAL) BY MOUTH 3 (THREE) TIMES DAILY. 04/10/15  Yes Ladene Artist, MD  carvedilol (COREG) 12.5 MG tablet TAKE 1 TABLET (12.5 MG TOTAL) BY MOUTH 2 (TWO) TIMES DAILY. 07/24/15  Yes Shaune Pascal Bensimhon, MD  Insulin NPH, Human,, Isophane, (HUMULIN N) 100 UNIT/ML Kiwkpen Inject subcutaneously 20 units in morning and 10 units at night. Inject 10 minutes after a meal 06/10/15  Yes Historical Provider, MD  metFORMIN (GLUCOPHAGE) 1000 MG tablet Take 1,000 mg by mouth 2 (two) times daily with a meal.  06/05/15  Yes Historical Provider, MD  niacin (NIASPAN) 1000 MG CR tablet TAKE 1 TABLET BY MOUTH AT BEDTIME 07/28/15  Yes Minna Merritts, MD  nitroGLYCERIN (NITROSTAT) 0.4 MG SL tablet Place 0.4 mg under the tongue every 5 (five) minutes as needed (MAX 3 TABLETS). FOR CHEST PAIN   Yes Historical Provider, MD  omeprazole (PRILOSEC) 20 MG capsule Take 20 mg by mouth daily.    Yes Historical Provider, MD  sacubitril-valsartan (ENTRESTO) 49-51 MG Take 1 tablet by mouth 2 (two) times daily. 03/31/15  Yes Minna Merritts, MD  torsemide (DEMADEX) 20 MG tablet Take 2 tablets (40 mg total) by mouth 2 (two) times daily. 07/01/15  Yes Minna Merritts, MD  XARELTO 20 MG TABS tablet TAKE 1 TABLET BY MOUTH EVERY DAY WITH LUNCH 11/24/14  Yes Minna Merritts, MD    Review of Systems    As above, doing well.  He denies chest pain, palpitations, dyspnea, pnd, orthopnea, n, v, dizziness, syncope, edema, weight gain, or early satiety.  All other systems reviewed and are otherwise negative except as noted above.  Physical Exam    VS:  BP 122/76 mmHg   Pulse 76  Ht 5' 11"  (1.803 m)  Wt 289 lb (131.09 kg)  BMI 40.33 kg/m2 , BMI Body mass index is 40.33 kg/(m^2). GEN: Well nourished, obese, well developed, in no acute distress. HEENT: normal. Neck: Supple, no JVD, carotid bruits, or masses. Cardiac: RRR, no murmurs, rubs, or gallops. No clubbing, cyanosis, trace bilat LE edema.  Radials/DP/PT 2+ and equal bilaterally.  Respiratory:  Respirations regular and unlabored, clear to auscultation bilaterally. GI: Soft, nontender, nondistended, BS + x 4. MS: no deformity or atrophy. Skin: warm and dry, no rash. Neuro:  Strength and sensation  are intact. Psych: Normal affect.  Accessory Clinical Findings    ECG - RSR, 1st deg avb, LAD, LAFB, PAE, prior antlat infarct - no acute st/t changes.  Assessment & Plan    1.  Chronic combined systolic/diastolic CHF/ICM:  Upon last visit, he was noted to have increased wt and LEE.  Torsemide was increased to 40 BID at that point and he has been doing much better since.  Wt is down six lbs.  He reports sodium indiscretion - eating fast food at least once daily and cold cuts for at least one other meal daily.  We discussed the importance of daily weights, sodium restriction, medication compliance, and symptom reporting and he verbalizes understanding, though he didn't commit to changing his dietary habits.  Cont bb, entresto, and torsemide @ current dose.  F/U bmet today.  2.  CAD: no c/p or DOE.  Cont asa, statin, bb.  3.  PAF/Flutter:  In sinus today.  Cont amio/xarelto.  4.  Hypertensive Heart Disease:  Well controlled on bb, entresto.  5.  HL:  On statin.  LDL 79 in 10/2013 with nl LFT's in 01/2015.  6.  Morbid Obesity:  He needs to increase his activity and reduce his caloric intake.  He said he plans to get out more once weather is better.  7.  DM II:  Previously poorly controlled.  Managed by IM.  9.  Dispo:  BMET today.  F/U Dr. Rockey Situ in 3 mos or sooner if necessary.   Murray Hodgkins,  NP 08/06/2015, 10:37 AM

## 2015-08-06 NOTE — Patient Instructions (Signed)
Medication Instructions:  Your physician recommends that you continue on your current medications as directed. Please refer to the Current Medication list given to you today.   Labwork: BMET  Testing/Procedures: none  Follow-Up: Your physician recommends that you schedule a follow-up appointment in: three months with Dr. Rockey Situ   Any Other Special Instructions Will Be Listed Below (If Applicable).     If you need a refill on your cardiac medications before your next appointment, please call your pharmacy.

## 2015-08-10 LAB — BASIC METABOLIC PANEL
BUN / CREAT RATIO: 18 (ref 10–22)
BUN: 16 mg/dL (ref 8–27)
CALCIUM: 8.9 mg/dL (ref 8.6–10.2)
CHLORIDE: 95 mmol/L — AB (ref 96–106)
CO2: 23 mmol/L (ref 18–29)
CREATININE: 0.9 mg/dL (ref 0.76–1.27)
GFR, EST AFRICAN AMERICAN: 104 mL/min/{1.73_m2} (ref >59)
GFR, EST NON AFRICAN AMERICAN: 90 mL/min/{1.73_m2} (ref >59)
Glucose: 377 mg/dL — ABNORMAL HIGH (ref 65–99)
Potassium: 3.9 mmol/L (ref 3.5–5.2)
Sodium: 138 mmol/L (ref 134–144)

## 2015-08-10 LAB — SPECIMEN STATUS REPORT

## 2015-09-09 ENCOUNTER — Encounter: Payer: Self-pay | Admitting: Gastroenterology

## 2015-09-21 ENCOUNTER — Telehealth: Payer: Self-pay | Admitting: *Deleted

## 2015-09-21 NOTE — Telephone Encounter (Signed)
Pt requiring PA for Entresto 49-51 mg. PA has been filled out will forward to MD for Signature.

## 2015-09-21 NOTE — Telephone Encounter (Signed)
PA has been faxed awaiting approval.

## 2015-09-28 ENCOUNTER — Telehealth: Payer: Self-pay | Admitting: *Deleted

## 2015-09-28 NOTE — Telephone Encounter (Signed)
Pt is aware that we have started PA awaiting approval from [facility].  Entresto samples 49-51 mg tablets placed upfront for pick up.

## 2015-09-28 NOTE — Telephone Encounter (Signed)
Pt has been approved for Entresto 49-51 mg 09/28/2015-09/27/2016. PA# [id number]

## 2015-09-28 NOTE — Telephone Encounter (Signed)
[facility] called wanting to know if prior auth is started on sacubitril-valsartan (ENTRESTO) 49-51 MG if not could that be completed? Thanks

## 2015-10-06 ENCOUNTER — Ambulatory Visit (INDEPENDENT_AMBULATORY_CARE_PROVIDER_SITE_OTHER): Payer: BLUE CROSS/BLUE SHIELD | Admitting: *Deleted

## 2015-10-06 DIAGNOSIS — I5042 Chronic combined systolic (congestive) and diastolic (congestive) heart failure: Secondary | ICD-10-CM

## 2015-10-06 DIAGNOSIS — Z95810 Presence of automatic (implantable) cardiac defibrillator: Secondary | ICD-10-CM

## 2015-10-06 DIAGNOSIS — I255 Ischemic cardiomyopathy: Secondary | ICD-10-CM

## 2015-10-09 ENCOUNTER — Other Ambulatory Visit: Payer: Self-pay | Admitting: Cardiovascular Disease

## 2015-10-09 ENCOUNTER — Other Ambulatory Visit: Payer: Self-pay | Admitting: Gastroenterology

## 2015-10-09 NOTE — Progress Notes (Signed)
Remote ICD transmission.   

## 2015-10-10 LAB — CUP PACEART REMOTE DEVICE CHECK
Battery Remaining Longevity: 121 mo
Battery Voltage: 3.02 V
Brady Statistic RA Percent Paced: 0.04 %
Brady Statistic RV Percent Paced: 0.03 %
Date Time Interrogation Session: 20170307092405
HIGH POWER IMPEDANCE MEASURED VALUE: 75 Ohm
Implantable Lead Location: 753860
Implantable Lead Model: 5076
Implantable Lead Model: 6935
Lead Channel Impedance Value: 304 Ohm
Lead Channel Impedance Value: 342 Ohm
Lead Channel Pacing Threshold Amplitude: 0.625 V
Lead Channel Pacing Threshold Pulse Width: 0.4 ms
Lead Channel Sensing Intrinsic Amplitude: 2.375 mV
Lead Channel Setting Sensing Sensitivity: 0.3 mV
MDC IDC LEAD IMPLANT DT: 20150916
MDC IDC LEAD IMPLANT DT: 20150916
MDC IDC LEAD LOCATION: 753859
MDC IDC MSMT LEADCHNL RA IMPEDANCE VALUE: 437 Ohm
MDC IDC MSMT LEADCHNL RA SENSING INTR AMPL: 2.375 mV
MDC IDC MSMT LEADCHNL RV PACING THRESHOLD AMPLITUDE: 1.5 V
MDC IDC MSMT LEADCHNL RV PACING THRESHOLD PULSEWIDTH: 0.4 ms
MDC IDC MSMT LEADCHNL RV SENSING INTR AMPL: 5.625 mV
MDC IDC MSMT LEADCHNL RV SENSING INTR AMPL: 5.625 mV
MDC IDC SET LEADCHNL RA PACING AMPLITUDE: 1.5 V
MDC IDC SET LEADCHNL RV PACING AMPLITUDE: 3 V
MDC IDC SET LEADCHNL RV PACING PULSEWIDTH: 0.4 ms
MDC IDC STAT BRADY AP VP PERCENT: 0 %
MDC IDC STAT BRADY AP VS PERCENT: 0.04 %
MDC IDC STAT BRADY AS VP PERCENT: 0.03 %
MDC IDC STAT BRADY AS VS PERCENT: 99.93 %

## 2015-10-10 NOTE — Progress Notes (Signed)
Normal remote reviewed.  Next [facility] 01/05/16

## 2015-10-14 ENCOUNTER — Encounter: Payer: Self-pay | Admitting: Cardiology

## 2015-10-26 ENCOUNTER — Encounter: Payer: Self-pay | Admitting: Cardiovascular Disease

## 2015-10-26 ENCOUNTER — Ambulatory Visit (INDEPENDENT_AMBULATORY_CARE_PROVIDER_SITE_OTHER): Payer: BLUE CROSS/BLUE SHIELD | Admitting: Cardiovascular Disease

## 2015-10-26 VITALS — BP 108/68 | HR 79 | Ht 71.0 in | Wt 287.5 lb

## 2015-10-26 DIAGNOSIS — I255 Ischemic cardiomyopathy: Secondary | ICD-10-CM | POA: Diagnosis not present

## 2015-10-26 DIAGNOSIS — I480 Paroxysmal atrial fibrillation: Secondary | ICD-10-CM

## 2015-10-26 DIAGNOSIS — I5022 Chronic systolic (congestive) heart failure: Secondary | ICD-10-CM

## 2015-10-26 MED ORDER — RIVAROXABAN 20 MG PO TABS: ORAL_TABLET | ORAL | Status: DC

## 2015-10-26 NOTE — Assessment & Plan Note (Signed)
Appears euvolemic on today's visit We have stressed the importance of aggressive diabetes control

## 2015-10-26 NOTE — Patient Instructions (Signed)
You are doing well. No medication changes were made.  Please call us if you have new issues that need to be addressed before your next appt.  Your physician wants you to follow-up in: 6 months.  You will receive a reminder letter in the mail two months in advance. If you don't receive a letter, please call our office to schedule the follow-up appointment.   

## 2015-10-26 NOTE — Assessment & Plan Note (Signed)
We have encouraged continued exercise, careful diet management in an effort to lose weight. 

## 2015-10-26 NOTE — Assessment & Plan Note (Signed)
Tolerating torsemide, weight down, appears relatively euvolemic on today's visit No changes to his medications

## 2015-10-26 NOTE — Assessment & Plan Note (Signed)
Maintaining normal sinus rhythm on today's visit No changes to his medications, continue carvedilol, amiodarone, anticoagulation

## 2015-10-26 NOTE — Progress Notes (Signed)
Patient ID: Colton Lamb, male    DOB: 1951/07/14, 65 y.o.   MRN: [id number]  HPI Comments: Colton Lamb is a 65 year old gentleman with a history of paroxysmal atrial fibrillation,  hyperlipidemia, history of Crohn's ileocolitis, coronary artery disease, PCI x3 to the LAD, circumflex, PDA and PL branch, ejection fraction 45-50% with anterior hypokinesis at that time,  catheterization in 2008  with a cypher stent to the OM, sleep apnea, has not been using CPAP.  He has poorly controlled diabetes, little follow-up with his primary care S/p catheter ablation of atrial flutter and insertion of a DDD ICD. no syncope or ICD shock. He presents today for follow-up of his coronary artery disease, atrial fibrillation  On his last clinic visit he changed his Lasix to torsemide On this regimen, he had better diuresis Weight today is down 8 pounds compared to his prior clinic visit He is tolerating entresto twice a day Denies any significant leg swelling or shortness of breath  EKG on today's visit shows normal sinus rhythm with rate 79 bpm, unable to exclude old anterior MI   review of recent lab work shows elevated potassium 5.3. He denies taking any potassium supplement  Other past medical history Admitted in April 2015 with atrial flutter, non-ST elevation MI, with cardioversion, cardiac catheterization showing occluded LAD,  moderate distal RCA disease, patent left circumflex.. Medical management recommended.  Severely depressed ejection fraction noted, LifeVest was placed at that time. Ejection fraction 20-25%   hospital admission May 13 with discharge 12/14/2013 with dig toxicity, junctional rhythm, bradycardia with heart rates in the 30s, syncope.  Acute renal failure likely secondary to ATN. Also noted to have TSH of 10. ACE inhibitor, spironolactone, isosorbide was held. Renal function improved at the time of discharge  Followup echocardiogram 01/29/2014 showing improved ejection  fraction up to 30-35%  Allergies  Allergen Reactions  . Iodine Other (See Comments)    Shortness of breath, swelling and hives  . Shrimp [Shellfish Allergy] Other (See Comments)    SWELLING    HIVES    SHORTNESS OF BREATH  . Tetracycline Rash    Current Outpatient Prescriptions on File Prior to Visit  Medication Sig Dispense Refill  . acetaminophen (TYLENOL) 325 MG tablet Take 1-2 tablets (325-650 mg total) by mouth every 4 (four) hours as needed for mild pain.    Marland Kitchen amiodarone (PACERONE) 200 MG tablet TAKE 1 TABLET BY MOUTH MON.-FRIDAY AND 1/2 TABLET BY MOUTH SATURDAY AND SUNDAY. 30 tablet 9  . aspirin 81 MG tablet Take 81 mg by mouth daily.     Marland Kitchen atorvastatin (LIPITOR) 80 MG tablet Take 1 tablet (80 mg total) by mouth daily. 30 tablet 6  . balsalazide (COLAZAL) 750 MG capsule TAKE 1 CAPSULE (750 MG TOTAL) BY MOUTH 3 (THREE) TIMES DAILY. 90 capsule 5  . carvedilol (COREG) 12.5 MG tablet TAKE 1 TABLET (12.5 MG TOTAL) BY MOUTH 2 (TWO) TIMES DAILY. 60 tablet 3  . Insulin NPH, Human,, Isophane, (HUMULIN N) 100 UNIT/ML Kiwkpen Inject subcutaneously 20 units in morning and 10 units at night. Inject 10 minutes after a meal    . metFORMIN (GLUCOPHAGE) 1000 MG tablet Take 1,000 mg by mouth 2 (two) times daily with a meal.     . niacin (NIASPAN) 1000 MG CR tablet TAKE 1 TABLET BY MOUTH AT BEDTIME 30 tablet 3  . nitroGLYCERIN (NITROSTAT) 0.4 MG SL tablet Place 0.4 mg under the tongue every 5 (five) minutes as needed (MAX 3 TABLETS). FOR  CHEST PAIN    . omeprazole (PRILOSEC) 20 MG capsule Take 20 mg by mouth daily.     . sacubitril-valsartan (ENTRESTO) 49-51 MG Take 1 tablet by mouth 2 (two) times daily. 60 tablet 11  . torsemide (DEMADEX) 20 MG tablet Take 2 tablets (40 mg total) by mouth 2 (two) times daily. 120 tablet 6   No current facility-administered medications on file prior to visit.    Past Medical History  Diagnosis Date  . Crohn's ileocolitis ([place])   . GERD (gastroesophageal reflux  disease)   . Hx of adenomatous colonic polyps 11/2003  . Coronary artery disease     a. s/p MI 2007/2015;  b. s/p prior PCI to the LAD/LCX/PDA/PL;  c. 2008: s/p Cypher DES to the OM.  Marland Kitchen Hypertension   . Obesity   . Paroxysmal atrial fibrillation (HCC)     a. CHA2DS2VASc = 4-->xarelto/amio.  . Chronic systolic heart failure ([place])     a. 10/2013 EF 20-25%, grade III DD, RV mildly dilated and sys fx mild/mod reduced;  b. 01/2014 Echo: EF 30-35%, gr3 DD, mod dil LA.  . Ischemic cardiomyopathy     s. 01/2014 s/p MDT [id number] [name] XT DR single lead AICD.  Marland Kitchen Atrial flutter ([place])     a. s/p Cardioversion 11/22/13, on amiodarone and Xarelto.  . Syncope     a.  11/2013 in setting of volume depletion and bradycardia due to dig toxicity   . Hyperlipidemia   . Sleep apnea   . Type II diabetes mellitus [facility])     Past Surgical History  Procedure Laterality Date  . 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 Hydrocele excision Bilateral   . Foot surgery Left     bone spur  . Cardioversion N/A 03/05/2014    Procedure: CARDIOVERSION;  Surgeon: Jolaine Artist, MD;  Location: [facility] ENDOSCOPY;  Service: Cardiovascular;  Laterality: N/A;  . Cardiac defibrillator placement  04/16/2014    Medtronic Evira device  . Cardiac electrophysiology study and ablation  04/16/2014    atrial flutter ablation  . Coronary angioplasty with stent placement  2007; 2008 X 2    "1+1 ~ 1"  . Cardiac catheterization  10/2013  . Left heart catheterization with coronary angiogram N/A 11/22/2013    Procedure: LEFT HEART CATHETERIZATION WITH CORONARY ANGIOGRAM;  Surgeon: Sinclair Grooms, MD;  Location: [facility] CATH LAB;  Service: Cardiovascular;  Laterality: N/A;  . Atrial flutter ablation N/A 04/16/2014    Procedure: ATRIAL FLUTTER ABLATION;  Surgeon: Evans Lance, MD;  Location: [facility] CATH LAB;  Service: Cardiovascular;  Laterality: N/A;  . Implantable cardioverter defibrillator implant N/A 04/16/2014     Procedure: IMPLANTABLE CARDIOVERTER DEFIBRILLATOR IMPLANT;  Surgeon: Evans Lance, MD;  Location: [facility] CATH LAB;  Service: Cardiovascular;  Laterality: N/A;    Social History  reports that he has never smoked. He has never used smokeless tobacco. He reports that he does not drink alcohol or use illicit drugs.  Family History family history includes Breast cancer in his mother; Heart attack in his father; Heart disease in his father. There is no history of Colon cancer.  Review of Systems  Respiratory: Negative.   Cardiovascular: Negative.   Gastrointestinal: Negative.   Musculoskeletal: Negative.   Allergic/Immunologic: Negative.   Neurological: Negative.   Hematological: Negative.   Psychiatric/Behavioral: Negative.   All other systems reviewed and are negative.   BP 108/68 mmHg  Pulse 79  Ht 5' 11"  (1.803  m)  Wt 287 lb 8 oz (130.409 kg)  BMI 40.12 kg/m2  Physical Exam  Constitutional: He is oriented to person, place, and time. He appears well-developed and well-nourished.  Obese  HENT:  Head: Normocephalic.  Nose: Nose normal.  Mouth/Throat: Oropharynx is clear and moist.  Eyes: Conjunctivae are normal. Pupils are equal, round, and reactive to light.  Neck: Normal range of motion. Neck supple. No JVD present.  Cardiovascular: Normal rate, regular rhythm, S1 normal, S2 normal, normal heart sounds and intact distal pulses.  Exam reveals no gallop and no friction rub.   No murmur heard. Pulmonary/Chest: Effort normal and breath sounds normal. No respiratory distress. He has no wheezes. He has no rales. He exhibits no tenderness.  Abdominal: Soft. Bowel sounds are normal. He exhibits no distension. There is no tenderness.  Musculoskeletal: Normal range of motion. He exhibits no edema or tenderness.  Lymphadenopathy:    He has no cervical adenopathy.  Neurological: He is alert and oriented to person, place, and time. Coordination normal.  Skin: Skin is warm and dry. No rash  noted. No erythema.  Psychiatric: He has a normal mood and affect. His behavior is normal. Judgment and thought content normal.      Assessment and Plan   Nursing note and vitals reviewed.

## 2015-11-11 ENCOUNTER — Other Ambulatory Visit: Payer: Self-pay | Admitting: Cardiovascular Disease

## 2015-12-06 ENCOUNTER — Emergency Department: Payer: BLUE CROSS/BLUE SHIELD

## 2015-12-06 ENCOUNTER — Emergency Department
Admission: EM | Admit: 2015-12-06 | Discharge: 2015-12-06 | Disposition: A | Discharge status: Home / Self Care | Payer: BLUE CROSS/BLUE SHIELD | Attending: Emergency Medicine | Admitting: Emergency Medicine

## 2015-12-06 DIAGNOSIS — I2510 Atherosclerotic heart disease of native coronary artery without angina pectoris: Secondary | ICD-10-CM | POA: Insufficient documentation

## 2015-12-06 DIAGNOSIS — I480 Paroxysmal atrial fibrillation: Secondary | ICD-10-CM | POA: Insufficient documentation

## 2015-12-06 DIAGNOSIS — Z794 Long term (current) use of insulin: Secondary | ICD-10-CM | POA: Diagnosis not present

## 2015-12-06 DIAGNOSIS — Z7984 Long term (current) use of oral hypoglycemic drugs: Secondary | ICD-10-CM | POA: Diagnosis not present

## 2015-12-06 DIAGNOSIS — I5041 Acute combined systolic (congestive) and diastolic (congestive) heart failure: Secondary | ICD-10-CM | POA: Insufficient documentation

## 2015-12-06 DIAGNOSIS — R112 Nausea with vomiting, unspecified: Secondary | ICD-10-CM | POA: Diagnosis not present

## 2015-12-06 DIAGNOSIS — Z9861 Coronary angioplasty status: Secondary | ICD-10-CM | POA: Diagnosis not present

## 2015-12-06 DIAGNOSIS — I110 Hypertensive heart disease with heart failure: Secondary | ICD-10-CM | POA: Insufficient documentation

## 2015-12-06 DIAGNOSIS — E119 Type 2 diabetes mellitus without complications: Secondary | ICD-10-CM | POA: Diagnosis not present

## 2015-12-06 DIAGNOSIS — Z7982 Long term (current) use of aspirin: Secondary | ICD-10-CM | POA: Insufficient documentation

## 2015-12-06 DIAGNOSIS — Z79899 Other long term (current) drug therapy: Secondary | ICD-10-CM | POA: Diagnosis not present

## 2015-12-06 DIAGNOSIS — I252 Old myocardial infarction: Secondary | ICD-10-CM | POA: Diagnosis not present

## 2015-12-06 DIAGNOSIS — I255 Ischemic cardiomyopathy: Secondary | ICD-10-CM | POA: Diagnosis not present

## 2015-12-06 LAB — COMPREHENSIVE METABOLIC PANEL
ALBUMIN: 3.6 g/dL (ref 3.5–5.0)
ALT: 23 U/L (ref 17–63)
AST: 25 U/L (ref 15–41)
Alkaline Phosphatase: 137 U/L — ABNORMAL HIGH (ref 38–126)
Anion gap: 12 (ref 5–15)
BUN: 20 mg/dL (ref 6–20)
CHLORIDE: 97 mmol/L — AB (ref 101–111)
CO2: 25 mmol/L (ref 22–32)
Calcium: 8.7 mg/dL — ABNORMAL LOW (ref 8.9–10.3)
Creatinine, Ser: 1.04 mg/dL (ref 0.61–1.24)
GFR calc Af Amer: >60 mL/min (ref >60)
Glucose, Bld: 329 mg/dL — ABNORMAL HIGH (ref 65–99)
POTASSIUM: 3.8 mmol/L (ref 3.5–5.1)
SODIUM: 134 mmol/L — AB (ref 135–145)
Total Bilirubin: 0.8 mg/dL (ref 0.3–1.2)
Total Protein: 7.4 g/dL (ref 6.5–8.1)

## 2015-12-06 LAB — CBC WITH DIFFERENTIAL/PLATELET
BASOS ABS: 0 10*3/uL (ref 0–0.1)
EOS ABS: 0.1 10*3/uL (ref 0–0.7)
HCT: 45.4 % (ref 40.0–52.0)
Hemoglobin: 15.4 g/dL (ref 13.0–18.0)
Lymphocytes Relative: 3 %
Lymphs Abs: 0.4 10*3/uL — ABNORMAL LOW (ref 1.0–3.6)
MCH: 28.7 pg (ref 26.0–34.0)
MCHC: 34 g/dL (ref 32.0–36.0)
MCV: 84.5 fL (ref 80.0–100.0)
MONO ABS: 0.4 10*3/uL (ref 0.2–1.0)
Monocytes Relative: 3 %
Neutro Abs: 12.6 10*3/uL — ABNORMAL HIGH (ref 1.4–6.5)
Neutrophils Relative %: 93 %
PLATELETS: 203 10*3/uL (ref 150–440)
RBC: 5.37 MIL/uL (ref 4.40–5.90)
RDW: 14.9 % — AB (ref 11.5–14.5)
WBC: 13.6 10*3/uL — ABNORMAL HIGH (ref 3.8–10.6)

## 2015-12-06 LAB — TROPONIN I: Troponin I: <0.03 ng/mL (ref <0.031)

## 2015-12-06 LAB — LIPASE, BLOOD: LIPASE: 34 U/L (ref 11–51)

## 2015-12-06 MED ORDER — ONDANSETRON HCL 4 MG/2ML IJ SOLN
4.0000 mg | Freq: Once | INTRAMUSCULAR | Status: AC
  Administered 2015-12-06: 4 mg via INTRAVENOUS
  Filled 2015-12-06: qty 2

## 2015-12-06 MED ORDER — SODIUM CHLORIDE 0.9 % IV BOLUS (SEPSIS)
500.0000 mL | Freq: Once | INTRAVENOUS | Status: AC
  Administered 2015-12-06: 500 mL via INTRAVENOUS

## 2015-12-06 MED ORDER — ONDANSETRON HCL 4 MG PO TABS: 4.0000 mg | ORAL_TABLET | Freq: Three times a day (TID) | ORAL | Status: DC | PRN

## 2015-12-06 NOTE — ED Notes (Addendum)
Lisa(sister) [phone] call for update.

## 2015-12-06 NOTE — ED Provider Notes (Signed)
Time Seen: Approximately 1830  I have reviewed the triage notes  Chief Complaint: Nausea and Emesis   History of Present Illness: Colton Lamb is a 65 y.o. male  who presents with acute onset of nausea and vomiting started this afternoon approximately 1 PM. Patient denies any chest or abdominal pain or loose watery stool associated with it. He states he vomited 3 times without any blood or bile. He is not aware of any foodborne exposure. He denies any recent travel. Patient has had a history of Crohn's disease with bowel resection. He denies any abdominal pain or distention. He does present with a low-grade fever and was not aware that he had a temperature.    Past Medical History  Diagnosis Date  . Crohn's ileocolitis ([place])   . GERD (gastroesophageal reflux disease)   . Hx of adenomatous colonic polyps 11/2003  . Coronary artery disease     a. s/p MI 2007/2015;  b. s/p prior PCI to the LAD/LCX/PDA/PL;  c. 2008: s/p Cypher DES to the OM.  Marland Kitchen Hypertension   . Obesity   . Paroxysmal atrial fibrillation (HCC)     a. CHA2DS2VASc = 4-->xarelto/amio.  . Chronic systolic heart failure ([place])     a. 10/2013 EF 20-25%, grade III DD, RV mildly dilated and sys fx mild/mod reduced;  b. 01/2014 Echo: EF 30-35%, gr3 DD, mod dil LA.  . Ischemic cardiomyopathy     s. 01/2014 s/p MDT DDBB1D1 [name] XT DR single lead AICD.  Marland Kitchen Atrial flutter ([place])     a. s/p Cardioversion 11/22/13, on amiodarone and Xarelto.  . Syncope     a.  11/2013 in setting of volume depletion and bradycardia due to dig toxicity   . Hyperlipidemia   . Sleep apnea   . Type II diabetes mellitus [facility])     Patient Active Problem List   Diagnosis Date Noted  . Type II diabetes mellitus ([place])   . Ischemic cardiomyopathy   . Hyponatremia 07/01/2015  . Diabetes mellitus type 2, uncontrolled, with complications ([place]) 31/59/4585  . 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 OSA (obstructive sleep apnea)  12/20/2013  . Morbid obesity ([place]) 12/20/2013  . AKI (acute kidney injury) - Creatinine improved at d/c 12/14/2013  . Elevated TSH - will need f/u TFTs with PCP in 3-4 weeks 12/14/2013  . Junctional bradycardia - resolved 12/14/2013  . Syncope - due to bradycardia in setting of Digoxin Toxicity 12/11/2013  . Chronic systolic heart failure ([place]) 12/06/2013  . At risk for sudden cardiac death - on LifeVest 12/22/2013  . Acute combined systolic and diastolic CHF, NYHA class 3 -- 2/2 MI 12/22/2013  . Cardiomyopathy, ischemic 12-22-2013  . Atrial flutter ([place]) 11/22/2013  . NSTEMI (non-ST elevated myocardial infarction) ([place]) 11/22/2013  . Long term current use of anticoagulant 10/19/2010  . Hyperlipidemia 05/06/2010  . Edema 05/06/2010  . CAD S/P percutaneous coronary angioplasty - multiple PCIs 11/11/2008  . Atrial fibrillation ([place]) 11/11/2008  . GERD 11/11/2008  . [place] INTESTINE 11/11/2008  . COLONIC POLYPS, HX OF 11/11/2008  . [place] ALLERGY 11/11/2008    Past Surgical History  Procedure Laterality Date  . 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 Hydrocele excision Bilateral   . Foot surgery Left     bone spur  . Cardioversion N/A 03/05/2014    Procedure: CARDIOVERSION;  Surgeon: Jolaine Artist, MD;  Location: [facility] ENDOSCOPY;  Service: Cardiovascular;  Laterality: N/A;  .  Cardiac defibrillator placement  04/16/2014    Medtronic Evira device  . Cardiac electrophysiology study and ablation  04/16/2014    atrial flutter ablation  . Coronary angioplasty with stent placement  2007; 2008 X 2    "1+1 ~ 1"  . Cardiac catheterization  10/2013  . Left heart catheterization with coronary angiogram N/A 11/22/2013    Procedure: LEFT HEART CATHETERIZATION WITH CORONARY ANGIOGRAM;  Surgeon: Sinclair Grooms, MD;  Location: [facility] CATH LAB;  Service: Cardiovascular;  Laterality: N/A;  . Atrial flutter ablation N/A 04/16/2014    Procedure:  ATRIAL FLUTTER ABLATION;  Surgeon: Evans Lance, MD;  Location: [facility] CATH LAB;  Service: Cardiovascular;  Laterality: N/A;  . Implantable cardioverter defibrillator implant N/A 04/16/2014    Procedure: IMPLANTABLE CARDIOVERTER DEFIBRILLATOR IMPLANT;  Surgeon: Evans Lance, MD;  Location[facility] CATH LAB;  Service: Cardiovascular;  Laterality: N/A;    Past Surgical History  Procedure Laterality Date  . 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 Hydrocele excision Bilateral   . Foot surgery Left     bone spur  . Cardioversion N/A 03/05/2014    Procedure: CARDIOVERSION;  Surgeon: Jolaine Artist, MD;  Location: [facility] ENDOSCOPY;  Service: Cardiovascular;  Laterality: N/A;  . Cardiac defibrillator placement  04/16/2014    Medtronic Evira device  . Cardiac electrophysiology study and ablation  04/16/2014    atrial flutter ablation  . Coronary angioplasty with stent placement  2007; 2008 X 2    "1+1 ~ 1"  . Cardiac catheterization  10/2013  . Left heart catheterization with coronary angiogram N/A 11/22/2013    Procedure: LEFT HEART CATHETERIZATION WITH CORONARY ANGIOGRAM;  Surgeon: Sinclair Grooms, MD;  Location: [facility] CATH LAB;  Service: Cardiovascular;  Laterality: N/A;  . Atrial flutter ablation N/A 04/16/2014    Procedure: ATRIAL FLUTTER ABLATION;  Surgeon: Evans Lance, MD;  Location: [facility] CATH LAB;  Service: Cardiovascular;  Laterality: N/A;  . Implantable cardioverter defibrillator implant N/A 04/16/2014    Procedure: IMPLANTABLE CARDIOVERTER DEFIBRILLATOR IMPLANT;  Surgeon: Evans Lance, MD;  Location: [facility] CATH LAB;  Service: Cardiovascular;  Laterality: N/A;    Current Outpatient Rx  Name  Route  Sig  Dispense  Refill  . acetaminophen (TYLENOL) 325 MG tablet   Oral   Take 1-2 tablets (325-650 mg total) by mouth every 4 (four) hours as needed for mild pain.         Marland Kitchen amiodarone (PACERONE) 200 MG tablet      TAKE 1 TABLET BY MOUTH MON.-FRIDAY AND 1/2 TABLET BY  MOUTH SATURDAY AND SUNDAY.   30 tablet   9   . aspirin 81 MG tablet   Oral   Take 81 mg by mouth daily.          Marland Kitchen atorvastatin (LIPITOR) 80 MG tablet   Oral   Take 1 tablet (80 mg total) by mouth daily.   30 tablet   6   . balsalazide (COLAZAL) 750 MG capsule      TAKE 1 CAPSULE (750 MG TOTAL) BY MOUTH 3 (THREE) TIMES DAILY.   90 capsule   5   . carvedilol (COREG) 12.5 MG tablet      TAKE 1 TABLET (12.5 MG TOTAL) BY MOUTH 2 (TWO) TIMES DAILY.   60 tablet   3   . Insulin NPH, Human,, Isophane, (HUMULIN N) 100 UNIT/ML Kiwkpen      Inject subcutaneously 20 units in morning and 10  units at night. Inject 10 minutes after a meal         . metFORMIN (GLUCOPHAGE) 1000 MG tablet   Oral   Take 1,000 mg by mouth 2 (two) times daily with a meal.          . niacin (NIASPAN) 1000 MG CR tablet      TAKE 1 TABLET BY MOUTH AT BEDTIME   30 tablet   3   . nitroGLYCERIN (NITROSTAT) 0.4 MG SL tablet   Sublingual   Place 0.4 mg under the tongue every 5 (five) minutes as needed (MAX 3 TABLETS). FOR CHEST PAIN         . omeprazole (PRILOSEC) 20 MG capsule   Oral   Take 20 mg by mouth daily.          . rivaroxaban (XARELTO) 20 MG TABS tablet      TAKE 1 TABLET BY MOUTH EVERY DAY WITH LUNCH   30 tablet   6   . sacubitril-valsartan (ENTRESTO) 49-51 MG   Oral   Take 1 tablet by mouth 2 (two) times daily.   60 tablet   11   . torsemide (DEMADEX) 20 MG tablet   Oral   Take 2 tablets (40 mg total) by mouth 2 (two) times daily.   120 tablet   6     Allergies:  Iodine; Shrimp; and Tetracycline  Family History: Family History  Problem Relation Age of Onset  . Breast cancer Mother   . Heart disease Father   . Colon cancer Neg Hx   . Heart attack Father     Social History: Social History  Substance Use Topics  . Smoking status: Never Smoker   . Smokeless tobacco: Never Used  . Alcohol Use: No     Review of Systems:   10 point review of systems was  performed and was otherwise negative:  Constitutional: No fever Eyes: No visual disturbances ENT: No sore throat, ear pain Cardiac: No chest pain Respiratory: No shortness of breath, wheezing, or stridor Abdomen: No abdominal pain, no vomiting, No diarrhea Endocrine: No weight loss, No night sweats Extremities: No peripheral edema, cyanosis Skin: No rashes, easy bruising Neurologic: No focal weakness, trouble with speech or swollowing Urologic: No dysuria, Hematuria, or urinary frequency   Physical Exam:  ED Triage Vitals  Enc Vitals Group     BP 12/06/15 1823 142/79 mmHg     Pulse Rate 12/06/15 1823 98     Resp 12/06/15 1823 16     Temp 12/06/15 1823 100.2 F (37.9 C)     Temp Source 12/06/15 1823 Oral     SpO2 12/06/15 1823 100 %     Weight 12/06/15 1823 287 lb (130.182 kg)     Height 12/06/15 1823 5' 11"  (1.803 m)     Head Cir --      Peak Flow --      Pain Score --      Pain Loc --      Pain Edu? --      Excl. in [place]? --     General: Awake , Alert , and Oriented times 3; GCS 15 Head: Normal cephalic , atraumatic Eyes: Pupils equal , round, reactive to light Nose/Throat: No nasal drainage, patent upper airway without erythema or exudate.  Neck: Supple, Full range of motion, No anterior adenopathy or palpable thyroid masses Lungs: Clear to ascultation without wheezes , rhonchi, or rales Heart: Regular rate, regular rhythm without murmurs ,  gallops , or rubs Abdomen: Obese, softn tender without rebound, guarding , or rigidity; bowel sounds positive and symmetric in all 4 quadrants. No organomegaly .    no palpable masses  Extremities: 2 plus symmetric pulses. No edema, clubbing or cyanosis Neurologic: normal ambulation, Motor symmetric without deficits, sensory intact Skin: warm, dry, no rashes   Labs:   All laboratory work was reviewed including any pertinent negatives or positives listed below:  Labs Reviewed  CBC WITH DIFFERENTIAL/PLATELET  COMPREHENSIVE  METABOLIC PANEL  LIPASE, BLOOD  TROPONIN I  Laboratory work was reviewed and showed no clinically significant abnormalities.   EKG:  ED ECG REPORT I, Daymon Larsen, the attending physician, personally viewed and interpreted this ECG.  Date: 12/06/2015 EKG Time: 1822 Rhythm: normal sinus rhythm with occasional PVCs QRS Axis: normal Intervals: normal ST/T Wave abnormalities: normal Conduction Disturbances: QRS complexes noticed in the septal leads Narrative Interpretation: unremarkable No acute ischemic changes   Radiology:  EXAM: DG ABDOMEN ACUTE W/ 1V CHEST  COMPARISON: 04/17/2014 and prior radiographs  FINDINGS: Cardiomegaly and left-sided pacemaker again noted.  There is no evidence of airspace disease, pleural effusion or pneumothorax.  Bowel gas pattern is unremarkable.  There is no evidence of bowel obstruction or pneumoperitoneum.  No suspicious calcifications are identified.  No acute bony abnormalities are noted.  IMPRESSION: No evidence of acute abnormality.  Unremarkable bowel gas pattern.  Cardiomegaly.   Electronically Signed By: Margarette Canada M.D.    I personally reviewed the radiologic studies     ED Course Patient's stay here was uneventful and he was given IV fluids along with IV Zofran. Reexamination of the patient shows he is able to maintain oral food and fluid intake and I felt was stable for discharge. The source of his nausea and vomiting is unknown at this time does not appear to be a bowel obstruction or an intrathoracic or intra-abdominal source. He may have early viral gastroenteritis but does not have the diarrhea component at this point. Reexamination of his abdomen shows no peritoneal signs. Patient was prescribed Zofran on an outpatient basis and advised to advance his diet as tolerated*    Assessment: * Acute nausea and vomiting    Plan:  Outpatient management Patient was advised to return immediately if condition  worsens. Patient was advised to follow up with their primary care physician or other specialized physicians involved in their outpatient care. The patient and/or family member/power of attorney had laboratory results reviewed at the bedside. All questions and concerns were addressed and appropriate discharge instructions were distributed by the nursing staff.             Daymon Larsen, MD 12/06/15 [phone]

## 2015-12-06 NOTE — Discharge Instructions (Signed)
Nausea and Vomiting Nausea is a sick feeling that often comes before throwing up (vomiting). Vomiting is a reflex where stomach contents come out of your mouth. Vomiting can cause severe loss of body fluids (dehydration). Children and elderly adults can become dehydrated quickly, especially if they also have diarrhea. Nausea and vomiting are symptoms of a condition or disease. It is important to find the cause of your symptoms. CAUSES   Direct irritation of the stomach lining. This irritation can result from increased acid production (gastroesophageal reflux disease), infection, food poisoning, taking certain medicines (such as nonsteroidal anti-inflammatory drugs), alcohol use, or tobacco use.  Signals from the brain.These signals could be caused by a headache, heat exposure, an inner ear disturbance, increased pressure in the brain from injury, infection, a tumor, or a concussion, pain, emotional stimulus, or metabolic problems.  An obstruction in the gastrointestinal tract (bowel obstruction).  Illnesses such as diabetes, hepatitis, gallbladder problems, appendicitis, kidney problems, cancer, sepsis, atypical symptoms of a heart attack, or eating disorders.  Medical treatments such as chemotherapy and radiation.  Receiving medicine that makes you sleep (general anesthetic) during surgery. DIAGNOSIS Your caregiver may ask for tests to be done if the problems do not improve after a few days. Tests may also be done if symptoms are severe or if the reason for the nausea and vomiting is not clear. Tests may include:  Urine tests.  Blood tests.  Stool tests.  Cultures (to look for evidence of infection).  X-rays or other imaging studies. Test results can help your caregiver make decisions about treatment or the need for additional tests. TREATMENT You need to stay well hydrated. Drink frequently but in small amounts.You may wish to drink water, sports drinks, clear broth, or eat frozen  ice pops or gelatin dessert to help stay hydrated.When you eat, eating slowly may help prevent nausea.There are also some antinausea medicines that may help prevent nausea. HOME CARE INSTRUCTIONS   Take all medicine as directed by your caregiver.  If you do not have an appetite, do not force yourself to eat. However, you must continue to drink fluids.  If you have an appetite, eat a normal diet unless your caregiver tells you differently.  Eat a variety of complex carbohydrates (rice, wheat, potatoes, bread), lean meats, yogurt, fruits, and vegetables.  Avoid high-fat foods because they are more difficult to digest.  Drink enough water and fluids to keep your urine clear or pale yellow.  If you are dehydrated, ask your caregiver for specific rehydration instructions. Signs of dehydration may include:  Severe thirst.  Dry lips and mouth.  Dizziness.  Dark urine.  Decreasing urine frequency and amount.  Confusion.  Rapid breathing or pulse. SEEK IMMEDIATE MEDICAL CARE IF:   You have blood or brown flecks (like coffee grounds) in your vomit.  You have black or bloody stools.  You have a severe headache or stiff neck.  You are confused.  You have severe abdominal pain.  You have chest pain or trouble breathing.  You do not urinate at least once every 8 hours.  You develop cold or clammy skin.  You continue to vomit for longer than 24 to 48 hours.  You have a fever. MAKE SURE YOU:   Understand these instructions.  Will watch your condition.  Will get help right away if you are not doing well or get worse.   This information is not intended to replace advice given to you by your health care provider. Make sure  you discuss any questions you have with your health care provider.   Document Released: 07/18/2005 Document Revised: 10/10/2011 Document Reviewed: 12/15/2010 Elsevier Interactive Patient Education [employer].  Please return immediately if  condition worsens. Please contact her primary physician or the physician you were given for referral. If you have any specialist physicians involved in her treatment and plan please also contact them. Thank you for using [facility].

## 2015-12-06 NOTE — ED Notes (Signed)
Pt came to ED via EMS c/o nausea, vomiting x3. Pt denies any pain. Pt has cardiac history- 3 stents, htn, diabetes.

## 2015-12-06 NOTE — ED Notes (Signed)
Pt. Meeting family member in waiting room.

## 2015-12-11 ENCOUNTER — Other Ambulatory Visit: Payer: Self-pay | Admitting: Internal Medicine

## 2015-12-11 ENCOUNTER — Other Ambulatory Visit: Payer: Self-pay | Admitting: Cardiovascular Disease

## 2016-01-05 ENCOUNTER — Ambulatory Visit (INDEPENDENT_AMBULATORY_CARE_PROVIDER_SITE_OTHER): Payer: BLUE CROSS/BLUE SHIELD | Admitting: *Deleted

## 2016-01-05 DIAGNOSIS — I255 Ischemic cardiomyopathy: Secondary | ICD-10-CM | POA: Diagnosis not present

## 2016-01-05 DIAGNOSIS — I5022 Chronic systolic (congestive) heart failure: Secondary | ICD-10-CM

## 2016-01-05 NOTE — Progress Notes (Signed)
Remote ICD transmission.   

## 2016-01-14 LAB — CUP PACEART REMOTE DEVICE CHECK
Battery Remaining Longevity: 118 mo
Battery Voltage: 3.01 V
Brady Statistic AP VS Percent: 0.12 %
Brady Statistic AS VS Percent: 99.85 %
HighPow Impedance: 73 Ohm
Implantable Lead Implant Date: 20150916
Implantable Lead Location: 753859
Implantable Lead Model: 6935
Lead Channel Impedance Value: 399 Ohm
Lead Channel Pacing Threshold Amplitude: 0.625 V
Lead Channel Pacing Threshold Amplitude: 1.25 V
Lead Channel Pacing Threshold Pulse Width: 0.4 ms
Lead Channel Pacing Threshold Pulse Width: 0.4 ms
Lead Channel Sensing Intrinsic Amplitude: 2.5 mV
Lead Channel Sensing Intrinsic Amplitude: 2.5 mV
Lead Channel Sensing Intrinsic Amplitude: 5.875 mV
Lead Channel Setting Pacing Amplitude: 1.5 V
MDC IDC LEAD IMPLANT DT: 20150916
MDC IDC LEAD LOCATION: 753860
MDC IDC MSMT LEADCHNL RV IMPEDANCE VALUE: 304 Ohm
MDC IDC MSMT LEADCHNL RV IMPEDANCE VALUE: 342 Ohm
MDC IDC MSMT LEADCHNL RV SENSING INTR AMPL: 5.875 mV
MDC IDC SESS DTM: 20170606073323
MDC IDC SET LEADCHNL RV PACING AMPLITUDE: 2.5 V
MDC IDC SET LEADCHNL RV PACING PULSEWIDTH: 0.4 ms
MDC IDC SET LEADCHNL RV SENSING SENSITIVITY: 0.3 mV
MDC IDC STAT BRADY AP VP PERCENT: 0 %
MDC IDC STAT BRADY AS VP PERCENT: 0.03 %
MDC IDC STAT BRADY RA PERCENT PACED: 0.12 %
MDC IDC STAT BRADY RV PERCENT PACED: 0.03 %

## 2016-01-20 ENCOUNTER — Encounter: Payer: Self-pay | Admitting: Cardiology

## 2016-02-12 ENCOUNTER — Other Ambulatory Visit: Payer: Self-pay | Admitting: Cardiovascular Disease

## 2016-02-19 ENCOUNTER — Other Ambulatory Visit: Payer: Self-pay | Admitting: Cardiovascular Disease

## 2016-02-24 ENCOUNTER — Other Ambulatory Visit: Payer: Self-pay | Admitting: Cardiovascular Disease

## 2016-04-08 ENCOUNTER — Other Ambulatory Visit: Payer: Self-pay | Admitting: Internal Medicine

## 2016-04-15 ENCOUNTER — Other Ambulatory Visit: Payer: Self-pay | Admitting: Cardiovascular Disease

## 2016-04-15 ENCOUNTER — Other Ambulatory Visit: Payer: Self-pay | Admitting: Gastroenterology

## 2016-04-26 ENCOUNTER — Other Ambulatory Visit: Payer: Self-pay | Admitting: Cardiovascular Disease

## 2016-04-26 ENCOUNTER — Encounter: Payer: Self-pay | Admitting: Internal Medicine

## 2016-04-26 ENCOUNTER — Ambulatory Visit (INDEPENDENT_AMBULATORY_CARE_PROVIDER_SITE_OTHER): Payer: Medicare Other | Admitting: Internal Medicine

## 2016-04-26 VITALS — BP 130/84 | HR 85 | Ht 71.0 in | Wt 288.6 lb

## 2016-04-26 DIAGNOSIS — Z23 Encounter for immunization: Secondary | ICD-10-CM | POA: Diagnosis not present

## 2016-04-26 DIAGNOSIS — I255 Ischemic cardiomyopathy: Secondary | ICD-10-CM | POA: Diagnosis not present

## 2016-04-26 DIAGNOSIS — Z95810 Presence of automatic (implantable) cardiac defibrillator: Secondary | ICD-10-CM

## 2016-04-26 DIAGNOSIS — I2589 Other forms of chronic ischemic heart disease: Secondary | ICD-10-CM

## 2016-04-26 NOTE — Patient Instructions (Addendum)
Medication Instructions:  Your physician recommends that you continue on your current medications as directed. Please refer to the Current Medication list given to you today.   Labwork: None ordered   Testing/Procedures: None ordered   Follow-Up: Your physician wants you to follow-up in: 12 months with Dr. Lovena Le. You will receive a reminder letter in the mail two months in advance. If you don't receive a letter, please call our office to schedule the follow-up appointment.  Remote monitoring is used to monitor your ICD from home. This monitoring reduces the number of office visits required to check your device to one time per year. It allows [place] to keep an eye on the functioning of your device to ensure it is working properly. You are scheduled for a device check from home on 07/26/16. You may send your transmission at any time that day. If you have a wireless device, the transmission will be sent automatically. After your physician reviews your transmission, you will receive a postcard with your next transmission date.     Any Other Special Instructions Will Be Listed Below (If Applicable).     If you need a refill on your cardiac medications before your next appointment, please call your pharmacy.

## 2016-04-26 NOTE — Progress Notes (Signed)
HPI Colton Lamb returns today for followup. He is a very pleasant 65 year old man with morbid obesity, chronic systolic heart failure, atrial flutter, and hypertension. He underwent catheter ablation of atrial flutter and insertion of a DDD ICD over a year ago. In the interim, he has been stable. He denies palpitations, chest pain, shortness of breath is much improved. His appetite is good. He has had no syncope or ICD shock. He does admit to being sedentary.  Allergies  Allergen Reactions  . Iodine Other (See Comments)    Shortness of breath, swelling and hives  . Shrimp [Shellfish Allergy] Other (See Comments)    SWELLING    HIVES    SHORTNESS OF BREATH  . Tetracycline Rash     Current Outpatient Prescriptions  Medication Sig Dispense Refill  . acetaminophen (TYLENOL) 325 MG tablet Take 1-2 tablets (325-650 mg total) by mouth every 4 (four) hours as needed for mild pain.    Marland Kitchen amiodarone (PACERONE) 200 MG tablet TAKE 1 TABLET BY MOUTH MON.-FRIDAY AND 1/2 TABLET BY MOUTH SATURDAY AND SUNDAY. 30 tablet 9  . aspirin 81 MG tablet Take 81 mg by mouth daily.     Marland Kitchen atorvastatin (LIPITOR) 80 MG tablet TAKE 1 TABLET (80 MG TOTAL) BY MOUTH DAILY. 30 tablet 3  . balsalazide (COLAZAL) 750 MG capsule TAKE 1 CAPSULE (750 MG TOTAL) BY MOUTH 3 (THREE) TIMES DAILY. 90 capsule 5  . carvedilol (COREG) 12.5 MG tablet TAKE 1 TABLET (12.5 MG TOTAL) BY MOUTH 2 (TWO) TIMES DAILY. 60 tablet 3  . ENTRESTO 49-51 MG TAKE 1 TABLET BY MOUTH 2 (TWO) TIMES DAILY. 60 tablet 3  . Insulin NPH, Human,, Isophane, (HUMULIN N) 100 UNIT/ML Kiwkpen Inject subcutaneously 20 units in morning and 10 units at night. Inject 10 minutes after a meal    . metFORMIN (GLUCOPHAGE) 1000 MG tablet Take 1,000 mg by mouth 2 (two) times daily with a meal.     . niacin (NIASPAN) 1000 MG CR tablet TAKE 1 TABLET BY MOUTH AT BEDTIME 30 tablet 6  . nitroGLYCERIN (NITROSTAT) 0.4 MG SL tablet Place 0.4 mg under the tongue every 5 (five) minutes  as needed (MAX 3 TABLETS). FOR CHEST PAIN    . omeprazole (PRILOSEC) 20 MG capsule Take 20 mg by mouth daily.     . ondansetron (ZOFRAN) 4 MG tablet Take 1 tablet (4 mg total) by mouth every 8 (eight) hours as needed for nausea or vomiting. 21 tablet 0  . torsemide (DEMADEX) 20 MG tablet TAKE 2 TABLETS (40 MG TOTAL) BY MOUTH 2 (TWO) TIMES DAILY. 120 tablet 5  . XARELTO 20 MG TABS tablet TAKE 1 TABLET BY MOUTH EVERY DAY WITH LUNCH 30 tablet 3   No current facility-administered medications for this visit.      Past Medical History:  Diagnosis Date  . Atrial flutter ([place])    a. s/p Cardioversion 11/22/13, on amiodarone and Xarelto.  . Chronic systolic heart failure ([place])    a. 10/2013 EF 20-25%, grade III DD, RV mildly dilated and sys fx mild/mod reduced;  b. 01/2014 Echo: EF 30-35%, gr3 DD, mod dil LA.  Marland Kitchen Coronary artery disease    a. s/p MI 2007/2015;  b. s/p prior PCI to the LAD/LCX/PDA/PL;  c. 2008: s/p Cypher DES to the OM.  Marland Kitchen Crohn's ileocolitis ([place])   . GERD (gastroesophageal reflux disease)   . Hx of adenomatous colonic polyps 11/2003  . Hyperlipidemia   . Hypertension   .  Ischemic cardiomyopathy    s. 01/2014 s/p MDT [id number] [name] XT DR single lead AICD.  Marland Kitchen Obesity   . Paroxysmal atrial fibrillation (HCC)    a. CHA2DS2VASc = 4-->xarelto/amio.  . Sleep apnea   . Syncope    a.  11/2013 in setting of volume depletion and bradycardia due to dig toxicity   . Type II diabetes mellitus (HCC)     ROS:   All systems reviewed and negative except as noted in the HPI.   Past Surgical History:  Procedure Laterality Date  . ATRIAL FLUTTER ABLATION N/A 04/16/2014   Procedure: ATRIAL FLUTTER ABLATION;  Surgeon: Evans Lance, MD;  Location[facility] C[facility] LAB;  Service: Cardiovascular;  Laterality: N/A;  . CARDIAC CATHETERIZATION  10/2013  . CARDIAC DEFIBRILLATOR PLACEMENT  04/16/2014   Medtronic Evira device  . CARDIAC ELECTROPHYSIOLOGY STUDY AND ABLATION  04/16/2014   atrial flutter ablation    . CARDIOVERSION N/A 03/05/2014   Procedure: CARDIOVERSION;  Surgeon: Jolaine Artist, MD;  Location: [facility] ENDOSCOPY;  Service: Cardiovascular;  Laterality: N/A;  . CORONARY ANGIOPLASTY WITH STENT PLACEMENT  2007; 2008 X 2   "1+1 ~ 1"  . FOOT SURGERY Left    bone spur  . HYDROCELE EXCISION Bilateral   . 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 IMPLANTABLE CARDIOVERTER DEFIBRILLATOR IMPLANT N/A 04/16/2014   Procedure: IMPLANTABLE CARDIOVERTER DEFIBRILLATOR IMPLANT;  Surgeon: Evans Lance, MD;  Location: [facility] CATH LAB;  Service: Cardiovascular;  Laterality: N/A;  . LEFT HEART CATHETERIZATION WITH CORONARY ANGIOGRAM N/A 11/22/2013   Procedure: LEFT HEART CATHETERIZATION WITH CORONARY ANGIOGRAM;  Surgeon: Sinclair Grooms, MD;  Location: [name] CATH LAB;  Service: Cardiovascular;  Laterality: N/A;     Family History  Problem Relation Age of Onset  . Breast cancer Mother   . Heart disease Father   . Colon cancer Neg Hx   . Heart attack Father      Social History   Social History  . Marital status: Widowed    Spouse name: N/A  . Number of children: 1  . Years of education: N/A   Occupational History  . retired Retired   Social History Main Topics  . Smoking status: Never Smoker  . Smokeless tobacco: Never Used  . Alcohol use No  . Drug use: No  . Sexual activity: No   Other Topics Concern  . Not on file   Social History Narrative  . No narrative on file     BP 130/84   Pulse 85   Ht 5' 11"  (1.803 m)   Wt 288 lb 9.6 oz (130.9 kg)   BMI 40.25 kg/m   Physical Exam:  Well appearing middle-aged man, obese, NAD HEENT: Unremarkable Neck:  7 cm JVD, no thyromegally Back:  No CVA tenderness Lungs:  Clear with no wheezes, rales, or rhonchi. HEART:  Regular rate rhythm, no murmurs, no rubs, no clicks Abd:  soft, positive bowel sounds, no organomegally, no rebound, no guarding Ext:  2 plus pulses, no edema, no cyanosis, no clubbing Skin:  No  rashes no nodules Neuro:  CN II through XII intact, motor grossly intact  DEVICE  Normal device function.  See PaceArt for details.   Assess/Plan: 1. Atrial fib - he has been out of rhythm for less than 2 hours since his last visit a year ago. He will continue his anti-coagulation. 2. Chronic systolic heart failure - his symptoms remain class 2. No change in meds. He  is encouraged to maintain a low sodium diet.  3. CAD - he is s/p MI. He denies anginal symptoms. 4. ICD - his Medtronic DDD ICD is working normally. Will recheck in several months.  Mikle Bosworth.D.

## 2016-04-30 ENCOUNTER — Other Ambulatory Visit: Payer: Self-pay | Admitting: Gastroenterology

## 2016-05-04 ENCOUNTER — Encounter: Payer: Self-pay | Admitting: Cardiovascular Disease

## 2016-05-04 ENCOUNTER — Ambulatory Visit (INDEPENDENT_AMBULATORY_CARE_PROVIDER_SITE_OTHER): Payer: Medicare Other | Admitting: Cardiovascular Disease

## 2016-05-04 VITALS — BP 110/70 | HR 84 | Ht 71.0 in | Wt 288.2 lb

## 2016-05-04 DIAGNOSIS — I480 Paroxysmal atrial fibrillation: Secondary | ICD-10-CM

## 2016-05-04 DIAGNOSIS — I255 Ischemic cardiomyopathy: Secondary | ICD-10-CM

## 2016-05-04 DIAGNOSIS — E785 Hyperlipidemia, unspecified: Secondary | ICD-10-CM | POA: Diagnosis not present

## 2016-05-04 DIAGNOSIS — I5022 Chronic systolic (congestive) heart failure: Secondary | ICD-10-CM

## 2016-05-04 DIAGNOSIS — Z794 Long term (current) use of insulin: Secondary | ICD-10-CM

## 2016-05-04 DIAGNOSIS — E119 Type 2 diabetes mellitus without complications: Secondary | ICD-10-CM

## 2016-05-04 DIAGNOSIS — I2589 Other forms of chronic ischemic heart disease: Secondary | ICD-10-CM | POA: Diagnosis not present

## 2016-05-04 NOTE — Progress Notes (Signed)
Cardiology Office Note  Date:  05/04/2016   ID:  Colton Lamb, DOB 1950/11/01, MRN [id number]  PCP:  Sherrin Daisy, MD   Chief Complaint  Patient presents with  . other    6 month follow up. Meds reviewed by the pt. verbally. "doing well."    HPI:  Colton Lamb is a 65 year old gentleman with a history of paroxysmal atrial fibrillation,  hyperlipidemia, history of Crohn's ileocolitis, coronary artery disease, PCI x3 to the LAD, circumflex, PDA and PL branch, catheterization in 2008  with a cypher stent to the OM, sleep apnea, has not been using CPAP.  He has poorly controlled diabetes, little follow-up with his primary care S/p catheter ablation of atrial flutter and insertion of a DDD ICD. no syncope or ICD shock. He presents today for follow-up of his coronary artery disease, atrial fibrillation Ejection fraction 30-35% in July 2015 by echo  In follow-up today, he reports that he feels well He Does not get winded, no SOB on exertion Minimal edema, better in am, worse after long day on his feet  Reports that he was seen in the ER in May 2017, stomach bug, N/V, diarrhea Potassium noted to be mildly low at that time  On his last clinic visit he changed his Lasix to torsemide Reports this does much better than Lasix He is tolerating entresto twice a day  EKG on today's visit shows normal sinus rhythm with rate 85 bpm, old anterior MI  Other past medical history Admitted in April 2015 with atrial flutter, non-ST elevation MI, with cardioversion, cardiac catheterization showing occluded LAD,  moderate distal RCA disease, patent left circumflex.. Medical management recommended.  Severely depressed ejection fraction noted, LifeVest was placed at that time. Ejection fraction 20-25%   hospital admission May 13 with discharge 12/14/2013 with dig toxicity, junctional rhythm, bradycardia with heart rates in the 30s, syncope.  Acute renal failure likely secondary to ATN. Also  noted to have TSH of 10. ACE inhibitor, spironolactone, isosorbide was held. Renal function improved at the time of discharge  Followup echocardiogram 01/29/2014 showing improved ejection fraction up to 30-35%   PMH:   has a past medical history of Atrial flutter ([place]); Chronic systolic heart failure ([place]); Coronary artery disease; Crohn's ileocolitis ([place]); GERD (gastroesophageal reflux disease); adenomatous colonic polyps (11/2003); Hyperlipidemia; Hypertension; Ischemic cardiomyopathy; Obesity; Paroxysmal atrial fibrillation ([place]); Sleep apnea; Syncope; and Type II diabetes mellitus ([place]).  PSH:    Past Surgical History:  Procedure Laterality Date  . ATRIAL FLUTTER ABLATION N/A 04/16/2014   Procedure: ATRIAL FLUTTER ABLATION;  Surgeon: Evans Lance, MD;  Location: [facility] CATH LAB;  Service: Cardiovascular;  Laterality: N/A;  . CARDIAC CATHETERIZATION  10/2013  . CARDIAC DEFIBRILLATOR PLACEMENT  04/16/2014   Medtronic Evira device  . CARDIAC ELECTROPHYSIOLOGY STUDY AND ABLATION  04/16/2014   atrial flutter ablation  . CARDIOVERSION N/A 03/05/2014   Procedure: CARDIOVERSION;  Surgeon: Jolaine Artist, MD;  Location: [facility] ENDOSCOPY;  Service: Cardiovascular;  Laterality: N/A;  . CORONARY ANGIOPLASTY WITH STENT PLACEMENT  2007; 2008 X 2   "1+1 ~ 1"  . FOOT SURGERY Left    bone spur  . HYDROCELE EXCISION Bilateral   . 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 IMPLANTABLE CARDIOVERTER DEFIBRILLATOR IMPLANT N/A 04/16/2014   Procedure: IMPLANTABLE CARDIOVERTER DEFIBRILLATOR IMPLANT;  Surgeon: Evans Lance, MD;  Location: [facility] CATH LAB;  Service: Cardiovascular;  Laterality: N/A;  . LEFT HEART CATHETERIZATION WITH CORONARY ANGIOGRAM N/A 11/22/2013  Procedure: LEFT HEART CATHETERIZATION WITH CORONARY ANGIOGRAM;  Surgeon: Sinclair Grooms, MD;  Location: [facility] CATH LAB;  Service: Cardiovascular;  Laterality: N/A;    Current Outpatient Prescriptions  Medication Sig  Dispense Refill  . acetaminophen (TYLENOL) 325 MG tablet Take 1-2 tablets (325-650 mg total) by mouth every 4 (four) hours as needed for mild pain.    Marland Kitchen amiodarone (PACERONE) 200 MG tablet TAKE 1 TABLET BY MOUTH MON.-FRIDAY AND 1/2 TABLET BY MOUTH SATURDAY AND SUNDAY. 30 tablet 9  . aspirin 81 MG tablet Take 81 mg by mouth daily.     Marland Kitchen atorvastatin (LIPITOR) 80 MG tablet TAKE 1 TABLET (80 MG TOTAL) BY MOUTH DAILY. 30 tablet 3  . balsalazide (COLAZAL) 750 MG capsule TAKE 1 CAPSULE (750 MG TOTAL) BY MOUTH 3 (THREE) TIMES DAILY. 90 capsule 0  . carvedilol (COREG) 12.5 MG tablet TAKE 1 TABLET (12.5 MG TOTAL) BY MOUTH 2 (TWO) TIMES DAILY. 60 tablet 3  . ENTRESTO 49-51 MG TAKE 1 TABLET BY MOUTH 2 (TWO) TIMES DAILY. 60 tablet 3  . Insulin NPH, Human,, Isophane, (HUMULIN N) 100 UNIT/ML Kiwkpen Inject subcutaneously 20 units in morning and 10 units at night. Inject 10 minutes after a meal    . metFORMIN (GLUCOPHAGE) 1000 MG tablet Take 1,000 mg by mouth 2 (two) times daily with a meal.     . niacin (NIASPAN) 1000 MG CR tablet TAKE 1 TABLET BY MOUTH AT BEDTIME 30 tablet 6  . nitroGLYCERIN (NITROSTAT) 0.4 MG SL tablet Place 0.4 mg under the tongue every 5 (five) minutes as needed (MAX 3 TABLETS). FOR CHEST PAIN    . omeprazole (PRILOSEC) 20 MG capsule Take 20 mg by mouth daily.     . ondansetron (ZOFRAN) 4 MG tablet Take 1 tablet (4 mg total) by mouth every 8 (eight) hours as needed for nausea or vomiting. 21 tablet 0  . torsemide (DEMADEX) 20 MG tablet TAKE 2 TABLETS (40 MG TOTAL) BY MOUTH 2 (TWO) TIMES DAILY. 120 tablet 5  . XARELTO 20 MG TABS tablet TAKE 1 TABLET BY MOUTH EVERY DAY WITH LUNCH 30 tablet 3   No current facility-administered medications for this visit.      Allergies:   Iodine; Shrimp [shellfish allergy]; and Tetracycline   Social History:  The patient  reports that he has never smoked. He has never used smokeless tobacco. He reports that he does not drink alcohol or use drugs.    Family History:   family history includes Breast cancer in his mother; Heart attack in his father; Heart disease in his father.    Review of Systems: Review of Systems  Constitutional: Negative.   Respiratory: Negative.   Cardiovascular: Negative.   Gastrointestinal: Negative.   Musculoskeletal: Negative.   Neurological: Negative.   Psychiatric/Behavioral: Negative.   All other systems reviewed and are negative.    PHYSICAL EXAM: VS:  BP 110/70 (BP Location: Left Arm, Patient Position: Sitting, Cuff Size: Normal)   Pulse 84   Ht 5' 11"  (1.803 m)   Wt 288 lb 4 oz (130.7 kg)   BMI 40.20 kg/m  , BMI Body mass index is 40.2 kg/m. GEN: Well nourished, well developed, in no acute distress, obese  HEENT: normal  Neck: no JVD, carotid bruits, or masses Cardiac: RRR; no murmurs, rubs, or gallops,no edema  Respiratory:  clear to auscultation bilaterally, normal work of breathing GI: soft, nontender, nondistended, + BS MS: no deformity or atrophy  Skin: warm and dry, no rash  Neuro:  Strength and sensation are intact Psych: euthymic mood, full affect    Recent Labs: 12/06/2015: ALT 23; BUN 20; Creatinine, Ser 1.04; Hemoglobin 15.4; Platelets 203; Potassium 3.8; Sodium 134    Lipid Panel Lab Results  Component Value Date   CHOL 131 11/23/2013   HDL 41 11/23/2013   LDLCALC 79 11/23/2013   TRIG 54 11/23/2013      Wt Readings from Last 3 Encounters:  05/04/16 288 lb 4 oz (130.7 kg)  04/26/16 288 lb 9.6 oz (130.9 kg)  12/06/15 287 lb (130.2 kg)       ASSESSMENT AND PLAN:  Paroxysmal atrial fibrillation (HCC) - Plan: EKG 44-IHKV, Basic Metabolic Panel (BMET) Maintaining normal sinus rhythm, continue amiodarone, anticoagulation Denies any symptoms concerning for paroxysmal episodes  Cardiomyopathy, ischemic - Plan: EKG 12-Lead Previous ejection fraction 35% Reports feeling well on his current medication regiment, will continue carvedilol, torsemide, entresto. We'll  consider adding Aldactone in follow-up  Chronic systolic heart failure ([place]) - Plan: EKG 42-VZDG, Basic Metabolic Panel (BMET) as above, reports he is stable  No changes to his medications   Hyperlipidemia, unspecified hyperlipidemia type - Plan: Lipid Profile, Hepatic function panel We have ordered repeat lab work,Goal LDL less than 70   Type 2 diabetes mellitus without complication, with long-term current use of insulin ([place]) - Plan: HgB A1c Long history of poor control diabetes Recommended he discuss his with primary care, consider referral to endocrine We'll check hemoglobin A1c    Total encounter time more than 25 minutes  Greater than 50% was spent in counseling and coordination of care with the patient   Disposition:   F/U  6 months   Orders Placed This Encounter  Procedures  . Basic Metabolic Panel (BMET)  . Lipid Profile  . Hepatic function panel  . HgB A1c  . EKG 12-Lead     Signed, Esmond Plants, M.D., Ph.D. 05/04/2016  [place], [place]

## 2016-05-04 NOTE — Patient Instructions (Addendum)
Medication Instructions:   No medication changes made  Labwork:  We will order a BMP, lipids and LFTs, HBA1C   Testing/Procedures:  No further testing at this time   Follow-Up: It was a pleasure seeing you in the office today. Please call us if you have new issues that need to be addressed before your next appt.  [phone]  Your physician wants you to follow-up in: 6 months.  You will receive a reminder letter in the mail two months in advance. If you don't receive a letter, please call our office to schedule the follow-up appointment.  If you need a refill on your cardiac medications before your next appointment, please call your pharmacy.

## 2016-05-09 ENCOUNTER — Other Ambulatory Visit: Payer: Self-pay | Admitting: Cardiovascular Disease

## 2016-05-09 ENCOUNTER — Encounter: Payer: Self-pay | Admitting: Cardiovascular Disease

## 2016-05-10 LAB — LIPID PANEL W/O CHOL/HDL RATIO
CHOLESTEROL TOTAL: 112 mg/dL (ref 100–199)
HDL: 35 mg/dL — ABNORMAL LOW (ref >39)
LDL CALC: 57 mg/dL (ref 0–99)
TRIGLYCERIDES: 100 mg/dL (ref 0–149)
VLDL Cholesterol Cal: 20 mg/dL (ref 5–40)

## 2016-05-10 LAB — BASIC METABOLIC PANEL
BUN/Creatinine Ratio: 13 (ref 10–24)
BUN: 14 mg/dL (ref 8–27)
CHLORIDE: 94 mmol/L — AB (ref 96–106)
CO2: 29 mmol/L (ref 18–29)
CREATININE: 1.04 mg/dL (ref 0.76–1.27)
Calcium: 8 mg/dL — ABNORMAL LOW (ref 8.6–10.2)
GFR calc Af Amer: 87 mL/min/{1.73_m2} (ref >59)
GFR calc non Af Amer: 75 mL/min/{1.73_m2} (ref >59)
GLUCOSE: 187 mg/dL — AB (ref 65–99)
Potassium: 3.7 mmol/L (ref 3.5–5.2)
Sodium: 141 mmol/L (ref 134–144)

## 2016-05-10 LAB — HEPATIC FUNCTION PANEL
ALT: 13 IU/L (ref 0–44)
AST: 15 IU/L (ref 0–40)
Albumin: 3.7 g/dL (ref 3.6–4.8)
Alkaline Phosphatase: 138 IU/L — ABNORMAL HIGH (ref 39–117)
BILIRUBIN, DIRECT: 0.13 mg/dL (ref 0.00–0.40)
Bilirubin Total: 0.3 mg/dL (ref 0.0–1.2)
Total Protein: 6.7 g/dL (ref 6.0–8.5)

## 2016-05-10 LAB — HGB A1C W/O EAG: HEMOGLOBIN A1C: 9.7 % — AB (ref 4.8–5.6)

## 2016-05-18 ENCOUNTER — Encounter: Payer: Self-pay | Admitting: Gastroenterology

## 2016-05-18 ENCOUNTER — Ambulatory Visit (INDEPENDENT_AMBULATORY_CARE_PROVIDER_SITE_OTHER): Payer: Medicare Other | Admitting: Gastroenterology

## 2016-05-18 VITALS — BP 124/74 | HR 76 | Ht 71.0 in | Wt 286.4 lb

## 2016-05-18 DIAGNOSIS — K5080 Crohn's disease of both small and large intestine without complications: Secondary | ICD-10-CM | POA: Diagnosis not present

## 2016-05-18 DIAGNOSIS — K219 Gastro-esophageal reflux disease without esophagitis: Secondary | ICD-10-CM

## 2016-05-18 DIAGNOSIS — I255 Ischemic cardiomyopathy: Secondary | ICD-10-CM

## 2016-05-18 MED ORDER — BALSALAZIDE DISODIUM 750 MG PO CAPS: ORAL_CAPSULE | ORAL | 11 refills | Status: DC

## 2016-05-18 NOTE — Progress Notes (Signed)
    History of Present Illness: This is a 65 year old male with Crohn's disease, personal history of adenomatous colon polyps and GERD. He has no ongoing gastrointestinal complaints. He has had several significant cardiac problems over the past few years including angioplasty, atrial fibrillation ablation, ICD placement. Last echocardiogram showed an EF of 30-35%. He is maintained on Xarelto He has deferred on recommended surveillance colonoscopies for the past few years.  Current Medications, Allergies, Past Medical History, Past Surgical History, Family History and Social History were reviewed in [employer] record.  Physical Exam: General: Well developed, well nourished, obese, no acute distress Head: Normocephalic and atraumatic Eyes:  sclerae anicteric, EOMI Ears: Normal auditory acuity Mouth: No deformity or lesions Lungs: Clear throughout to auscultation Heart: Regular rate and rhythm; no murmurs, rubs or bruits Abdomen: Soft, non tender and non distended. No masses, hepatosplenomegaly or hernias noted. Normal Bowel sounds Musculoskeletal: Symmetrical with no gross deformities  Pulses:  Normal pulses noted Extremities: No clubbing, cyanosis, edema or deformities noted Neurological: Alert oriented x 4, grossly nonfocal Psychological:  Alert and cooperative. Normal mood and affect  Assessment and Recommendations:  1. Crohn's ileocolitis. Asymptomatic. Continue balsalazide 750 mg 3 times a day. REV in 1 year.  2. Personal history of adenomatous colon polyps. Overdue for surveillance colonoscopy since October 2015. Continues to want to delay colonoscopy due to his significant cardiac history although he does appear to be stable from a medical standpoint. He appears to clearly understand the potential risks of recurrent colon polyps and colorectal cancer. Reenter colonoscopy recall for November 2018.  3. GERD. Continue standard antireflux measures and omeprazole 20  mg daily.  4. Ischemic CM with EF 30-35%. ICD placed in 2015. CAD and history of MI.  5. Afib status post ablation. Maintained on Xarelto.  I spent 15 minutes of face-to-face time with the patient. Greater than 50% of the time was spent counseling and coordinating care.

## 2016-05-18 NOTE — Patient Instructions (Signed)
We have sent the following medications to your pharmacy for you to pick up at your convenience:Colazal.   Thank you for choosing me and [facility].  [name]. Dagoberto Ligas., MD., Marval Regal

## 2016-06-10 ENCOUNTER — Other Ambulatory Visit: Payer: Self-pay | Admitting: Cardiovascular Disease

## 2016-07-13 ENCOUNTER — Other Ambulatory Visit: Payer: Self-pay | Admitting: Internal Medicine

## 2016-07-26 ENCOUNTER — Ambulatory Visit (INDEPENDENT_AMBULATORY_CARE_PROVIDER_SITE_OTHER): Payer: Medicare Other | Admitting: *Deleted

## 2016-07-26 DIAGNOSIS — I255 Ischemic cardiomyopathy: Secondary | ICD-10-CM | POA: Diagnosis not present

## 2016-07-26 NOTE — Progress Notes (Signed)
Remote ICD transmission.   

## 2016-07-27 LAB — CUP PACEART REMOTE DEVICE CHECK
Battery Remaining Longevity: 111 mo
Brady Statistic RA Percent Paced: 0.03 %
Date Time Interrogation Session: 20171226113324
HIGH POWER IMPEDANCE MEASURED VALUE: 71 Ohm
Implantable Lead Implant Date: 20150916
Implantable Lead Location: 753860
Implantable Lead Model: 5076
Implantable Pulse Generator Implant Date: 20150916
Lead Channel Impedance Value: 266 Ohm
Lead Channel Impedance Value: 323 Ohm
Lead Channel Pacing Threshold Amplitude: 0.875 V
Lead Channel Sensing Intrinsic Amplitude: 2.25 mV
Lead Channel Setting Pacing Pulse Width: 0.4 ms
Lead Channel Setting Sensing Sensitivity: 0.3 mV
MDC IDC LEAD IMPLANT DT: 20150916
MDC IDC LEAD LOCATION: 753859
MDC IDC LEAD MODEL: 6935
MDC IDC MSMT BATTERY VOLTAGE: 2.99 V
MDC IDC MSMT LEADCHNL RA IMPEDANCE VALUE: 399 Ohm
MDC IDC MSMT LEADCHNL RA PACING THRESHOLD PULSEWIDTH: 0.4 ms
MDC IDC MSMT LEADCHNL RA SENSING INTR AMPL: 2.25 mV
MDC IDC MSMT LEADCHNL RV PACING THRESHOLD AMPLITUDE: 1.5 V
MDC IDC MSMT LEADCHNL RV PACING THRESHOLD PULSEWIDTH: 0.4 ms
MDC IDC MSMT LEADCHNL RV SENSING INTR AMPL: 5.875 mV
MDC IDC MSMT LEADCHNL RV SENSING INTR AMPL: 5.875 mV
MDC IDC SET LEADCHNL RA PACING AMPLITUDE: 1.75 V
MDC IDC SET LEADCHNL RV PACING AMPLITUDE: 3 V
MDC IDC STAT BRADY AP VP PERCENT: 0 %
MDC IDC STAT BRADY AP VS PERCENT: 0.02 %
MDC IDC STAT BRADY AS VP PERCENT: 0.03 %
MDC IDC STAT BRADY AS VS PERCENT: 99.94 %
MDC IDC STAT BRADY RV PERCENT PACED: 0.04 %

## 2016-07-29 ENCOUNTER — Encounter: Payer: Self-pay | Admitting: Cardiology

## 2016-08-05 ENCOUNTER — Other Ambulatory Visit: Payer: Self-pay | Admitting: Cardiovascular Disease

## 2016-08-05 ENCOUNTER — Other Ambulatory Visit: Payer: Self-pay | Admitting: Internal Medicine

## 2016-08-19 ENCOUNTER — Other Ambulatory Visit: Payer: Self-pay | Admitting: Cardiovascular Disease

## 2016-09-06 ENCOUNTER — Inpatient Hospital Stay
Admission: EM | Admit: 2016-09-06 | Discharge: 2016-09-09 | DRG: 392 | Disposition: A | Discharge status: Home / Self Care | Payer: Medicare Other | Attending: Internal Medicine | Admitting: Internal Medicine

## 2016-09-06 DIAGNOSIS — Z8249 Family history of ischemic heart disease and other diseases of the circulatory system: Secondary | ICD-10-CM

## 2016-09-06 DIAGNOSIS — I2510 Atherosclerotic heart disease of native coronary artery without angina pectoris: Secondary | ICD-10-CM | POA: Diagnosis present

## 2016-09-06 DIAGNOSIS — Z881 Allergy status to other antibiotic agents status: Secondary | ICD-10-CM

## 2016-09-06 DIAGNOSIS — Z7982 Long term (current) use of aspirin: Secondary | ICD-10-CM

## 2016-09-06 DIAGNOSIS — R7989 Other specified abnormal findings of blood chemistry: Secondary | ICD-10-CM

## 2016-09-06 DIAGNOSIS — I110 Hypertensive heart disease with heart failure: Secondary | ICD-10-CM | POA: Diagnosis present

## 2016-09-06 DIAGNOSIS — Z79899 Other long term (current) drug therapy: Secondary | ICD-10-CM

## 2016-09-06 DIAGNOSIS — E876 Hypokalemia: Secondary | ICD-10-CM | POA: Diagnosis present

## 2016-09-06 DIAGNOSIS — Z955 Presence of coronary angioplasty implant and graft: Secondary | ICD-10-CM

## 2016-09-06 DIAGNOSIS — Z86010 Personal history of colonic polyps: Secondary | ICD-10-CM

## 2016-09-06 DIAGNOSIS — Z23 Encounter for immunization: Secondary | ICD-10-CM

## 2016-09-06 DIAGNOSIS — Z794 Long term (current) use of insulin: Secondary | ICD-10-CM

## 2016-09-06 DIAGNOSIS — E119 Type 2 diabetes mellitus without complications: Secondary | ICD-10-CM | POA: Diagnosis present

## 2016-09-06 DIAGNOSIS — E785 Hyperlipidemia, unspecified: Secondary | ICD-10-CM | POA: Diagnosis present

## 2016-09-06 DIAGNOSIS — Z6838 Body mass index (BMI) 38.0-38.9, adult: Secondary | ICD-10-CM

## 2016-09-06 DIAGNOSIS — I5022 Chronic systolic (congestive) heart failure: Secondary | ICD-10-CM | POA: Diagnosis present

## 2016-09-06 DIAGNOSIS — K5080 Crohn's disease of both small and large intestine without complications: Secondary | ICD-10-CM | POA: Diagnosis present

## 2016-09-06 DIAGNOSIS — E662 Morbid (severe) obesity with alveolar hypoventilation: Secondary | ICD-10-CM | POA: Diagnosis present

## 2016-09-06 DIAGNOSIS — G919 Hydrocephalus, unspecified: Secondary | ICD-10-CM | POA: Diagnosis present

## 2016-09-06 DIAGNOSIS — R42 Dizziness and giddiness: Secondary | ICD-10-CM | POA: Diagnosis present

## 2016-09-06 DIAGNOSIS — R112 Nausea with vomiting, unspecified: Secondary | ICD-10-CM

## 2016-09-06 DIAGNOSIS — I4892 Unspecified atrial flutter: Secondary | ICD-10-CM | POA: Diagnosis present

## 2016-09-06 DIAGNOSIS — I248 Other forms of acute ischemic heart disease: Secondary | ICD-10-CM | POA: Diagnosis present

## 2016-09-06 DIAGNOSIS — Z7901 Long term (current) use of anticoagulants: Secondary | ICD-10-CM

## 2016-09-06 DIAGNOSIS — I480 Paroxysmal atrial fibrillation: Secondary | ICD-10-CM | POA: Diagnosis present

## 2016-09-06 DIAGNOSIS — Z888 Allergy status to other drugs, medicaments and biological substances status: Secondary | ICD-10-CM

## 2016-09-06 DIAGNOSIS — I639 Cerebral infarction, unspecified: Secondary | ICD-10-CM

## 2016-09-06 DIAGNOSIS — I255 Ischemic cardiomyopathy: Secondary | ICD-10-CM | POA: Diagnosis present

## 2016-09-06 DIAGNOSIS — Z91013 Allergy to seafood: Secondary | ICD-10-CM

## 2016-09-06 DIAGNOSIS — Z95810 Presence of automatic (implantable) cardiac defibrillator: Secondary | ICD-10-CM

## 2016-09-06 DIAGNOSIS — R778 Other specified abnormalities of plasma proteins: Secondary | ICD-10-CM | POA: Diagnosis present

## 2016-09-06 DIAGNOSIS — R531 Weakness: Secondary | ICD-10-CM | POA: Diagnosis present

## 2016-09-06 DIAGNOSIS — K219 Gastro-esophageal reflux disease without esophagitis: Secondary | ICD-10-CM | POA: Diagnosis present

## 2016-09-06 DIAGNOSIS — I252 Old myocardial infarction: Secondary | ICD-10-CM

## 2016-09-06 DIAGNOSIS — Z7984 Long term (current) use of oral hypoglycemic drugs: Secondary | ICD-10-CM

## 2016-09-06 DIAGNOSIS — Z803 Family history of malignant neoplasm of breast: Secondary | ICD-10-CM

## 2016-09-06 LAB — URINALYSIS, COMPLETE (UACMP) WITH MICROSCOPIC
BACTERIA UA: NONE SEEN
Bilirubin Urine: NEGATIVE
GLUCOSE, UA: NEGATIVE mg/dL
Hgb urine dipstick: NEGATIVE
KETONES UR: 5 mg/dL — AB
Leukocytes, UA: NEGATIVE
Nitrite: NEGATIVE
PROTEIN: 30 mg/dL — AB
RBC / HPF: NONE SEEN RBC/hpf (ref 0–5)
SQUAMOUS EPITHELIAL / LPF: NONE SEEN
Specific Gravity, Urine: 1.021 (ref 1.005–1.030)
pH: 5 (ref 5.0–8.0)

## 2016-09-06 LAB — CBC
HCT: 41.1 % (ref 40.0–52.0)
HEMOGLOBIN: 13.6 g/dL (ref 13.0–18.0)
MCH: 26.8 pg (ref 26.0–34.0)
MCHC: 33.2 g/dL (ref 32.0–36.0)
MCV: 80.8 fL (ref 80.0–100.0)
Platelets: 283 10*3/uL (ref 150–440)
RBC: 5.08 MIL/uL (ref 4.40–5.90)
RDW: 17.2 % — AB (ref 11.5–14.5)
WBC: 13 10*3/uL — AB (ref 3.8–10.6)

## 2016-09-06 LAB — COMPREHENSIVE METABOLIC PANEL
ALT: 15 U/L — ABNORMAL LOW (ref 17–63)
ANION GAP: 13 (ref 5–15)
AST: 25 U/L (ref 15–41)
Albumin: 3.9 g/dL (ref 3.5–5.0)
Alkaline Phosphatase: 117 U/L (ref 38–126)
BILIRUBIN TOTAL: 0.7 mg/dL (ref 0.3–1.2)
BUN: 14 mg/dL (ref 6–20)
CHLORIDE: 93 mmol/L — AB (ref 101–111)
CO2: 32 mmol/L (ref 22–32)
Calcium: 8.7 mg/dL — ABNORMAL LOW (ref 8.9–10.3)
Creatinine, Ser: 0.98 mg/dL (ref 0.61–1.24)
Glucose, Bld: 157 mg/dL — ABNORMAL HIGH (ref 65–99)
POTASSIUM: 2.8 mmol/L — AB (ref 3.5–5.1)
Sodium: 138 mmol/L (ref 135–145)
TOTAL PROTEIN: 8.7 g/dL — AB (ref 6.5–8.1)

## 2016-09-06 LAB — TROPONIN I: Troponin I: 0.07 ng/mL (ref <0.03)

## 2016-09-06 LAB — LIPASE, BLOOD: LIPASE: 26 U/L (ref 11–51)

## 2016-09-06 MED ORDER — ONDANSETRON HCL 4 MG/2ML IJ SOLN
4.0000 mg | Freq: Once | INTRAMUSCULAR | Status: AC
  Administered 2016-09-06: 4 mg via INTRAVENOUS
  Filled 2016-09-06: qty 2

## 2016-09-06 MED ORDER — POTASSIUM CHLORIDE CRYS ER 20 MEQ PO TBCR
40.0000 meq | EXTENDED_RELEASE_TABLET | Freq: Once | ORAL | Status: AC
  Administered 2016-09-06: 40 meq via ORAL
  Filled 2016-09-06: qty 2

## 2016-09-06 MED ORDER — SODIUM CHLORIDE 0.9 % IV BOLUS (SEPSIS)
500.0000 mL | Freq: Once | INTRAVENOUS | Status: AC
  Administered 2016-09-06: 500 mL via INTRAVENOUS

## 2016-09-06 MED ORDER — ONDANSETRON HCL 4 MG PO TABS: 4.0000 mg | ORAL_TABLET | Freq: Three times a day (TID) | ORAL | 0 refills | Status: DC | PRN

## 2016-09-06 MED ORDER — ASPIRIN 81 MG PO CHEW
324.0000 mg | CHEWABLE_TABLET | Freq: Once | ORAL | Status: AC
  Administered 2016-09-06: 324 mg via ORAL
  Filled 2016-09-06: qty 4

## 2016-09-06 NOTE — ED Triage Notes (Signed)
Pt comes into the ED via EMS from home with c/o N/V intermittent for the past 2 weeks, states dry heaves with urinary incontinence with foul odor on arrival..

## 2016-09-06 NOTE — ED Notes (Signed)
Pt was given water and saltine crackers. Pt was able to tolerate both. MD made aware.

## 2016-09-06 NOTE — H&P (Signed)
[place] @ [facility] Admission History and Physical Harvie Bridge, D.O.  ---------------------------------------------------------------------------------------------------------------------   PATIENT NAME: Colton Lamb MR#: [id number] DATE OF BIRTH: 08/19/1950 DATE OF ADMISSION: 09/06/2016 PRIMARY CARE PHYSICIAN: Sherrin Daisy, MD  REQUESTING/REFERRING PHYSICIAN: ED Dr. Archie Balboa  CHIEF COMPLAINT: Chief Complaint  Patient presents with  . Emesis  . Urinary Frequency    HISTORY OF PRESENT ILLNESS: Colton Lamb is a 66 y.o. male with a known history of PAF/A flutter status post cardioversion, chronic systolic heart failure with an EF of 3035%, coronary artery disease status post MI, Crohn's, GERD, hypertension, hyperlipidemia, ischemic cardiomyopathy, obstructive sleep apnea and type 2 diabetes presents to the emergency department today complaining of feeling unwell..  Patient was in a usual state of health until yesterday when he n describes vague symptoms including dizziness and nausea with dry heaves but no vomiting, abdominal pain or diarrhea. He has been passing normal bowel movements and passing gas. He reports that his symptoms have improved since he got to the emergency department and he is feeling back to baseline.  Otherwise there has been no change in status. Patient has been taking medication as prescribed and there has been no recent change in medication or diet.  There has been no recent illness, travel or sick contacts.    Patient denies fevers/chills, weakness, shortness of breath,  vomiting, constipation, diarrhea , abdominal pain, dysuria/frequency, changes in mental status.   EMS/ED COURSE:   Patient received ASA, Potassium, Zofran, NS.  PAST MEDICAL HISTORY: Past Medical History:  Diagnosis Date  . Atrial flutter ([place])    a. s/p Cardioversion 11/22/13, on amiodarone and Xarelto.  . Chronic systolic heart failure ([place])    a. 10/2013 EF  20-25%, grade III DD, RV mildly dilated and sys fx mild/mod reduced;  b. 01/2014 Echo: EF 30-35%, gr3 DD, mod dil LA.  Marland Kitchen Coronary artery disease    a. s/p MI 2007/2015;  b. s/p prior PCI to the LAD/LCX/PDA/PL;  c. 2008: s/p Cypher DES to the OM.  Marland Kitchen Crohn's ileocolitis ([place])   . GERD (gastroesophageal reflux disease)   . Hx of adenomatous colonic polyps 11/2003  . Hyperlipidemia   . Hypertension   . Ischemic cardiomyopathy    s. 01/2014 s/p MDT [id number] [name] XT DR single lead AICD.  Marland Kitchen Obesity   . Paroxysmal atrial fibrillation (HCC)    a. CHA2DS2VASc = 4-->xarelto/amio.  . Sleep apnea   . Syncope    a.  11/2013 in setting of volume depletion and bradycardia due to dig toxicity   . Type II diabetes mellitus ([place])       PAST SURGICAL HISTORY: Past Surgical History:  Procedure Laterality Date  . ATRIAL FLUTTER ABLATION N/A 04/16/2014   Procedure: ATRIAL FLUTTER ABLATION;  Surgeon: Evans Lance, MD;  Location: [facility] CATH LAB;  Service: Cardiovascular;  Laterality: N/A;  . CARDIAC CATHETERIZATION  10/2013  . CARDIAC DEFIBRILLATOR PLACEMENT  04/16/2014   Medtronic Evira device  . CARDIAC ELECTROPHYSIOLOGY STUDY AND ABLATION  04/16/2014   atrial flutter ablation  . CARDIOVERSION N/A 03/05/2014   Procedure: CARDIOVERSION;  Surgeon: Jolaine Artist, MD;  Location: [facility] ENDOSCOPY;  Service: Cardiovascular;  Laterality: N/A;  . CORONARY ANGIOPLASTY WITH STENT PLACEMENT  2007; 2008 X 2   "1+1 ~ 1"  . FOOT SURGERY Left    bone spur  . HYDROCELE EXCISION Bilateral   . 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 IMPLANTABLE CARDIOVERTER DEFIBRILLATOR IMPLANT N/A 04/16/2014  Procedure: IMPLANTABLE CARDIOVERTER DEFIBRILLATOR IMPLANT;  Surgeon: Evans Lance, MD;  Location: [facility] CATH LAB;  Service: Cardiovascular;  Laterality: N/A;  . LEFT HEART CATHETERIZATION WITH CORONARY ANGIOGRAM N/A 11/22/2013   Procedure: LEFT HEART CATHETERIZATION WITH CORONARY ANGIOGRAM;  Surgeon: Sinclair Grooms, MD;  Location: [facility] CATH LAB;  Service: Cardiovascular;  Laterality: N/A;      SOCIAL HISTORY: Social History  Substance Use Topics  . Smoking status: Never Smoker  . Smokeless tobacco: Never Used  . Alcohol use No      FAMILY HISTORY: Family History  Problem Relation Age of Onset  . Breast cancer Mother   . Heart disease Father   . Heart attack Father   . Colon cancer Neg Hx      MEDICATIONS AT HOME: Prior to Admission medications   Medication Sig Start Date End Date Taking? Authorizing Provider  amiodarone (PACERONE) 200 MG tablet TAKE 1 TABLET BY MOUTH MON.-FRIDAY AND 1/2 TABLET BY MOUTH SATURDAY AND SUNDAY. 08/05/16  Yes Evans Lance, MD  aspirin 81 MG tablet Take 81 mg by mouth daily.    Yes Historical Provider, MD  atorvastatin (LIPITOR) 80 MG tablet TAKE 1 TABLET (80 MG TOTAL) BY MOUTH DAILY. 08/05/16  Yes Minna Merritts, MD  balsalazide (COLAZAL) 750 MG capsule TAKE 1 CAPSULE (750 MG TOTAL) BY MOUTH 3 (THREE) TIMES DAILY. 05/18/16  Yes Ladene Artist, MD  carvedilol (COREG) 12.5 MG tablet TAKE 1 TABLET (12.5 MG TOTAL) BY MOUTH 2 (TWO) TIMES DAILY. 07/13/16  Yes Shaune Pascal Bensimhon, MD  ENTRESTO 49-51 MG TAKE 1 TABLET BY MOUTH 2 (TWO) TIMES DAILY. 08/19/16  Yes Minna Merritts, MD  metFORMIN (GLUCOPHAGE) 1000 MG tablet Take 1,000 mg by mouth 2 (two) times daily with a meal.  06/05/15  Yes Historical Provider, MD  nitroGLYCERIN (NITROSTAT) 0.4 MG SL tablet Place 0.4 mg under the tongue every 5 (five) minutes as needed (MAX 3 TABLETS). FOR CHEST PAIN   Yes Historical Provider, MD  omeprazole (PRILOSEC) 20 MG capsule Take 20 mg by mouth daily.    Yes Historical Provider, MD  ondansetron (ZOFRAN) 4 MG tablet Take 1 tablet (4 mg total) by mouth every 8 (eight) hours as needed for nausea or vomiting. 12/06/15  Yes Daymon Larsen, MD  torsemide (DEMADEX) 20 MG tablet TAKE 2 TABLETS (40 MG TOTAL) BY MOUTH 2 (TWO) TIMES DAILY. 02/12/16  Yes Minna Merritts, MD  XARELTO 20  MG TABS tablet TAKE 1 TABLET BY MOUTH EVERY DAY WITH LUNCH 06/10/16  Yes Minna Merritts, MD  acetaminophen (TYLENOL) 325 MG tablet Take 1-2 tablets (325-650 mg total) by mouth every 4 (four) hours as needed for mild pain. 04/17/14   [name], NP  niacin (NIASPAN) 1000 MG CR tablet TAKE 1 TABLET BY MOUTH AT BEDTIME 02/24/16   Minna Merritts, MD  ondansetron (ZOFRAN) 4 MG tablet Take 1 tablet (4 mg total) by mouth every 8 (eight) hours as needed for nausea or vomiting. 09/06/16   Nance Pear, MD      DRUG ALLERGIES: Allergies  Allergen Reactions  . Iodine Other (See Comments)    Shortness of breath, swelling and hives  . Shrimp [Shellfish Allergy] Other (See Comments)    SWELLING    HIVES    SHORTNESS OF BREATH  . Tetracycline Rash     REVIEW OF SYSTEMS: CONSTITUTIONAL: No fatigue, weakness, fever, chills, weight gain/loss, headache EYES: No blurry or double vision. ENT: No  tinnitus, postnasal drip, redness or soreness of the oropharynx. RESPIRATORY: No dyspnea, cough, wheeze, hemoptysis. CARDIOVASCULAR: Positive chest pain, negative orthopnea, palpitations, syncope. GASTROINTESTINAL: No nausea, vomiting, constipation, diarrhea, abdominal pain. No hematemesis, melena or hematochezia. GENITOURINARY: No dysuria, frequency, hematuria. ENDOCRINE: No polyuria or nocturia. No heat or cold intolerance. HEMATOLOGY: No anemia, bruising, bleeding. INTEGUMENTARY: No rashes, ulcers, lesions. MUSCULOSKELETAL: No pain, arthritis, swelling, gout. NEUROLOGIC: No numbness, tingling, weakness or ataxia. No seizure-type activity. PSYCHIATRIC: No anxiety, depression, insomnia.  PHYSICAL EXAMINATION: VITAL SIGNS: Blood pressure 135/82, pulse 78, temperature 97.8 F (36.6 C), temperature source Oral, resp. rate 10, height 5' 11"  (1.803 m), weight 127 kg (280 lb), SpO2 99 %.  GENERAL: 66 y.o.-year-old obese white male patient, well-developed, well-nourished lying in the bed in no acute  distress.  Pleasant and cooperative.   HEENT: Head atraumatic, normocephalic. Pupils equal, round, reactive to light and accommodation. No scleral icterus. Extraocular muscles intact. Oropharynx is clear. Mucus membranes moist. NECK: Supple, full range of motion. No JVD, no bruit heard. No cervical lymphadenopathy. CHEST: Normal breath sounds bilaterally. No wheezing, rales, rhonchi or crackles. No use of accessory muscles of respiration.  No reproducible chest wall tenderness.  CARDIOVASCULAR: S1, S2 normal. No murmurs, rubs, or gallops appreciated. Cap refill <2 seconds. ABDOMEN: Soft, nontender, nondistended. No rebound, guarding, rigidity. Normoactive bowel sounds present in all four quadrants. No organomegaly or mass. EXTREMITIES: No pedal edema, cyanosis, or clubbing. NEUROLOGIC: Cranial nerves II through XII are grossly intact with no focal sensorimotor deficit. Muscle strength 5/5 in all extremities. Sensation intact. Gait not checked. PSYCHIATRIC: The patient is alert and oriented x 3. Normal affect, mood, thought content. SKIN: Warm, dry, and intact without obvious rash, lesion, or ulcer.  LABORATORY PANEL:  CBC  Recent Labs Lab 09/06/16 1539  WBC 13.0*  HGB 13.6  HCT 41.1  PLT 283   ----------------------------------------------------------------------------------------------------------------- Chemistries  Recent Labs Lab 09/06/16 1539  NA 138  K 2.8*  CL 93*  CO2 32  GLUCOSE 157*  BUN 14  CREATININE 0.98  CALCIUM 8.7*  AST 25  ALT 15*  ALKPHOS 117  BILITOT 0.7   ------------------------------------------------------------------------------------------------------------------ Cardiac Enzymes  Recent Labs Lab 09/06/16 1539  TROPONINI 0.07*   ------------------------------------------------------------------------------------------------------------------  RADIOLOGY: No results found.  EKG: Normal sinus rhythm at 81 bpm with  leftward axis, left  anterior hemiblock, and nonspecific ST-T wave changes.   IMPRESSION AND PLAN:  This is a 66 y.o. male with a history of  PAF/A flutter status post cardioversion, chronic systolic heart failure with an EF of 3035%, coronary artery disease status post MI, Crohn's, GERD, hypertension, hyperlipidemia, ischemic cardiomyopathy, obstructive sleep apnea and type 2 diabetes now being admitted with:  1. Elevated troponin rule out ACS WITH VAGUE SYMPTOMS AS PER HISTORY OF PRESENT ILLNESS  - Admit to observation with telemetry monitoring. - Trend troponins, check lipids and TSH. - Morphine, nitro, beta blocker, aspirin and statin ordered.   - Consider cardiology consultation if troponins trending up  2. Hypokalemia, mild - Replace PO and IV  3. Leukocytosis, with nausea - Follow up cultures  4. History of A. fib/A flutter -Continue amiodarone, Coreg, Xarelto  5. History of coronary artery disease status post MI -Continue aspirin, Lipitor, Coreg  6. History of hyperlipidemia -Continue Niaspan, Lipitor  7. History of hypertension -Continue Coreg, Entresto  8. History of diabetes -Hold metformin, regular insulin sliding scale coverage  9. History of CHF -Continue Demadex, Coreg  10. History of Crohn's -Continue Colazal  11. History  of GERD -Continue Protonix in place of Prilosec  Admission status: Observation, telemetry Diet/Nutrition: Heart healthy, carb controlled  Fluids: HL DVT Px: Xarelto, SCDs and early ambulation Code Status: Full  All the records are reviewed and case discussed with ED provider. Management plans discussed with the patient and/or family who express understanding and agree with plan of care.   TOTAL TIME TAKING CARE OF THIS PATIENT: 60 minutes.   Anira Senegal D.O. on 09/06/2016 at 10:51 PM Between 7am to 6pm - Pager - [phone] After 6pm go to [email or web address] - [employer] [facility] [place] [facility]ists Office [phone] CC:  Primary care physician; Sherrin Daisy, MD     Note: This dictation was prepared with Dragon dictation along with smaller phrase technology. Any transcriptional errors that result from this process are unintentional.

## 2016-09-06 NOTE — ED Notes (Signed)
In and out cath completed by this RN assisted by [place], Presenter, broadcasting.  Patient tolerated well.  Sterile technique maintained. Cloudy amber urine returned

## 2016-09-06 NOTE — ED Notes (Signed)
Patient saturated in urine. Soiled clothing removed. Patient cleaned.

## 2016-09-06 NOTE — ED Provider Notes (Signed)
[facility] Emergency Department Provider Note   ____________________________________________   I have reviewed the triage vital signs and the nursing notes.   HISTORY  Chief Complaint Emesis and Urinary Frequency   History limited by: Not Limited   HPI Colton Lamb is a 66 y.o. male who presents to the emergency department today via ems for a sick call. The patient states that he has not felt well for the past two weeks. His symptoms include nausea with dry heaving, weakness and urinary incontinence. He thought the symptoms were getting better until they got worse again two days ago. Called 911 today because he felt like he was not getting better.   Past Medical History:  Diagnosis Date  . Atrial flutter ([place])    a. s/p Cardioversion 11/22/13, on amiodarone and Xarelto.  . Chronic systolic heart failure ([place])    a. 10/2013 EF 20-25%, grade III DD, RV mildly dilated and sys fx mild/mod reduced;  b. 01/2014 Echo: EF 30-35%, gr3 DD, mod dil LA.  Marland Kitchen Coronary artery disease    a. s/p MI 2007/2015;  b. s/p prior PCI to the LAD/LCX/PDA/PL;  c. 2008: s/p Cypher DES to the OM.  Marland Kitchen Crohn's ileocolitis ([place])   . GERD (gastroesophageal reflux disease)   . Hx of adenomatous colonic polyps 11/2003  . Hyperlipidemia   . Hypertension   . Ischemic cardiomyopathy    s. 01/2014 s/p MDT [id number] [name] XT DR single lead AICD.  Marland Kitchen Obesity   . Paroxysmal atrial fibrillation (HCC)    a. CHA2DS2VASc = 4-->xarelto/amio.  . Sleep apnea   . Syncope    a.  11/2013 in setting of volume depletion and bradycardia due to dig toxicity   . Type II diabetes mellitus [facility])     Patient Active Problem List   Diagnosis Date Noted  . Type II diabetes mellitus ([place])   . Ischemic cardiomyopathy   . Hyponatremia 07/01/2015  . Diabetes mellitus type 2, uncontrolled, with complications ([place]) 64/40/3474  . 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 OSA (obstructive  sleep apnea) 12/20/2013  . Morbid obesity ([place]) 12/20/2013  . AKI (acute kidney injury) - Creatinine improved at d/c 12/14/2013  . Elevated TSH - will need f/u TFTs with PCP in 3-4 weeks 12/14/2013  . Junctional bradycardia - resolved 12/14/2013  . Syncope - due to bradycardia in setting of Digoxin Toxicity 12/11/2013  . Chronic systolic heart failure ([place]) 12/06/2013  . At risk for sudden cardiac death - on LifeVest 2013/12/11  . Acute combined systolic and diastolic CHF, NYHA class 3 -- 2/2 MI Dec 11, 2013  . Cardiomyopathy, ischemic 2013/12/11  . Atrial flutter ([place]) 11/22/2013  . NSTEMI (non-ST elevated myocardial infarction) ([place]) 11/22/2013  . Long term current use of anticoagulant 10/19/2010  . Hyperlipidemia 05/06/2010  . Edema 05/06/2010  . CAD S/P percutaneous coronary angioplasty - multiple PCIs 11/11/2008  . Atrial fibrillation ([place]) 11/11/2008  . GERD 11/11/2008  . [place] INTESTINE 11/11/2008  . COLONIC POLYPS, HX OF 11/11/2008  . [place] ALLERGY 11/11/2008    Past Surgical History:  Procedure Laterality Date  . ATRIAL FLUTTER ABLATION N/A 04/16/2014   Procedure: ATRIAL FLUTTER ABLATION;  Surgeon: Evans Lance, MD;  Location: [facility] CATH LAB;  Service: Cardiovascular;  Laterality: N/A;  . CARDIAC CATHETERIZATION  10/2013  . CARDIAC DEFIBRILLATOR PLACEMENT  04/16/2014   Medtronic Evira device  . CARDIAC ELECTROPHYSIOLOGY STUDY AND ABLATION  04/16/2014   atrial flutter ablation  .  CARDIOVERSION N/A 03/05/2014   Procedure: CARDIOVERSION;  Surgeon: Jolaine Artist, MD;  Location: [facility] ENDOSCOPY;  Service: Cardiovascular;  Laterality: N/A;  . CORONARY ANGIOPLASTY WITH STENT PLACEMENT  2007; 2008 X 2   "1+1 ~ 1"  . FOOT SURGERY Left    bone spur  . HYDROCELE EXCISION Bilateral   . 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 IMPLANTABLE CARDIOVERTER DEFIBRILLATOR IMPLANT N/A 04/16/2014   Procedure: IMPLANTABLE CARDIOVERTER  DEFIBRILLATOR IMPLANT;  Surgeon: Evans Lance, MD;  Location: [facility] CATH LAB;  Service: Cardiovascular;  Laterality: N/A;  . LEFT HEART CATHETERIZATION WITH CORONARY ANGIOGRAM N/A 11/22/2013   Procedure: LEFT HEART CATHETERIZATION WITH CORONARY ANGIOGRAM;  Surgeon: Sinclair Grooms, MD;  Location: [facility] CATH LAB;  Service: Cardiovascular;  Laterality: N/A;    Prior to Admission medications   Medication Sig Start Date End Date Taking? Authorizing Provider  acetaminophen (TYLENOL) 325 MG tablet Take 1-2 tablets (325-650 mg total) by mouth every 4 (four) hours as needed for mild pain. 04/17/14   [name], NP  amiodarone (PACERONE) 200 MG tablet TAKE 1 TABLET BY MOUTH MON.-FRIDAY AND 1/2 TABLET BY MOUTH SATURDAY AND SUNDAY. 08/05/16   Evans Lance, MD  aspirin 81 MG tablet Take 81 mg by mouth daily.     Historical Provider, MD  atorvastatin (LIPITOR) 80 MG tablet TAKE 1 TABLET (80 MG TOTAL) BY MOUTH DAILY. 08/05/16   Minna Merritts, MD  balsalazide (COLAZAL) 750 MG capsule TAKE 1 CAPSULE (750 MG TOTAL) BY MOUTH 3 (THREE) TIMES DAILY. 05/18/16   Ladene Artist, MD  carvedilol (COREG) 12.5 MG tablet TAKE 1 TABLET (12.5 MG TOTAL) BY MOUTH 2 (TWO) TIMES DAILY. 07/13/16   Shaune Pascal Bensimhon, MD  ENTRESTO 49-51 MG TAKE 1 TABLET BY MOUTH 2 (TWO) TIMES DAILY. 08/19/16   Minna Merritts, MD  Insulin NPH, Human,, Isophane, (HUMULIN N) 100 UNIT/ML Kiwkpen Inject subcutaneously 20 units in morning and 10 units at night. Inject 10 minutes after a meal 06/10/15   Historical Provider, MD  metFORMIN (GLUCOPHAGE) 1000 MG tablet Take 1,000 mg by mouth 2 (two) times daily with a meal.  06/05/15   Historical Provider, MD  niacin (NIASPAN) 1000 MG CR tablet TAKE 1 TABLET BY MOUTH AT BEDTIME 02/24/16   Minna Merritts, MD  nitroGLYCERIN (NITROSTAT) 0.4 MG SL tablet Place 0.4 mg under the tongue every 5 (five) minutes as needed (MAX 3 TABLETS). FOR CHEST PAIN    Historical Provider, MD  omeprazole (PRILOSEC) 20 MG capsule  Take 20 mg by mouth daily.     Historical Provider, MD  ondansetron (ZOFRAN) 4 MG tablet Take 1 tablet (4 mg total) by mouth every 8 (eight) hours as needed for nausea or vomiting. 12/06/15   Daymon Larsen, MD  torsemide (DEMADEX) 20 MG tablet TAKE 2 TABLETS (40 MG TOTAL) BY MOUTH 2 (TWO) TIMES DAILY. 02/12/16   Minna Merritts, MD  XARELTO 20 MG TABS tablet TAKE 1 TABLET BY MOUTH EVERY DAY WITH LUNCH 06/10/16   Minna Merritts, MD    Allergies Iodine; Shrimp [shellfish allergy]; and Tetracycline  Family History  Problem Relation Age of Onset  . Breast cancer Mother   . Heart disease Father   . Heart attack Father   . Colon cancer Neg Hx     Social History Social History  Substance Use Topics  . Smoking status: Never Smoker  . Smokeless tobacco: Never Used  . Alcohol  use No    Review of Systems  Constitutional: Negative for fever. Cardiovascular: Negative for chest pain. Respiratory: Negative for shortness of breath. Gastrointestinal:  Positive for nausea and vomiting.  Genitourinary: Negative for dysuria. Neurological: Negative for headaches, focal weakness or numbness.  10-point ROS otherwise negative.  ____________________________________________   PHYSICAL EXAM:  VITAL SIGNS: ED Triage Vitals  Enc Vitals Group     BP 142/82     Pulse 73     Resp 18     Temp 97.8     Temp src      SpO2 100   Constitutional: Alert and oriented. Well appearing and in no distress. Eyes: Conjunctivae are normal. Normal extraocular movements. ENT   Head: Normocephalic and atraumatic.   Nose: No congestion/rhinnorhea.   Mouth/Throat: Mucous membranes are moist.   Neck: No stridor. Hematological/Lymphatic/Immunilogical: No cervical lymphadenopathy. Cardiovascular: Normal rate, regular rhythm.  No murmurs, rubs, or gallops.  Respiratory: Normal respiratory effort without tachypnea nor retractions. Breath sounds are clear and equal bilaterally. No  wheezes/rales/rhonchi. Gastrointestinal: Soft and non tender. No rebound. No guarding.  Genitourinary: Deferred Musculoskeletal: Normal range of motion in all extremities. No lower extremity edema. Neurologic:  Normal speech and language. No gross focal neurologic deficits are appreciated.  Skin:  Skin is warm, dry and intact. No rash noted. Psychiatric: Mood and affect are normal. Speech and behavior are normal. Patient exhibits appropriate insight and judgment.  ____________________________________________    LABS (pertinent positives/negatives)  Labs Reviewed  COMPREHENSIVE METABOLIC PANEL - Abnormal; Notable for the following:       Result Value   Potassium 2.8 (*)    Chloride 93 (*)    Glucose, Bld 157 (*)    Calcium 8.7 (*)    Total Protein 8.7 (*)    ALT 15 (*)    All other components within normal limits  CBC - Abnormal; Notable for the following:    WBC 13.0 (*)    RDW 17.2 (*)    All other components within normal limits  URINALYSIS, COMPLETE (UACMP) WITH MICROSCOPIC - Abnormal; Notable for the following:    Color, Urine YELLOW (*)    APPearance CLEAR (*)    Ketones, ur 5 (*)    Protein, ur 30 (*)    All other components within normal limits  TROPONIN I - Abnormal; Notable for the following:    Troponin I 0.07 (*)    All other components within normal limits  LIPASE, BLOOD  TROPONIN I     ____________________________________________   EKG  I, Nance Pear, attending physician, personally viewed and interpreted this EKG  EKG Time: 2102 Rate: 81 Rhythm: normal sinus rhythm Axis: left axis deviation Intervals: qtc 546 QRS: LAFB ST changes: no st elevation Impression: abnormal ekg   ____________________________________________    RADIOLOGY  None  ____________________________________________   PROCEDURES  Procedures  ____________________________________________   INITIAL IMPRESSION / ASSESSMENT AND PLAN / ED COURSE  Pertinent labs &  imaging results that were available during my care of the patient were reviewed by me and considered in my medical decision making (see chart for details).  Patient presented with nausea and vomiting and weakness. Work up was concerning for troponin of 0.7. Given finding patient will be admitted to hospitalist service.  Patient was given small amount of IV fluids here in the department which he stated did help with his symptoms.  ____________________________________________   FINAL CLINICAL IMPRESSION(S) / ED DIAGNOSES  Final diagnoses:  Nausea and vomiting, intractability  of vomiting not specified, unspecified vomiting type  Elevated troponin     Note: This dictation was prepared with Dragon dictation. Any transcriptional errors that result from this process are unintentional     Nance Pear, MD 09/06/16 2257

## 2016-09-07 ENCOUNTER — Observation Stay: Payer: Medicare Other

## 2016-09-07 LAB — BASIC METABOLIC PANEL
ANION GAP: 11 (ref 5–15)
Anion gap: 8 (ref 5–15)
Anion gap: 9 (ref 5–15)
BUN: 13 mg/dL (ref 6–20)
BUN: 12 mg/dL (ref 6–20)
BUN: 13 mg/dL (ref 6–20)
CALCIUM: 8.3 mg/dL — AB (ref 8.9–10.3)
CALCIUM: 8.1 mg/dL — AB (ref 8.9–10.3)
CALCIUM: 8.1 mg/dL — AB (ref 8.9–10.3)
CHLORIDE: 97 mmol/L — AB (ref 101–111)
CO2: 29 mmol/L (ref 22–32)
CO2: 31 mmol/L (ref 22–32)
CO2: 29 mmol/L (ref 22–32)
CREATININE: 0.95 mg/dL (ref 0.61–1.24)
CREATININE: 1.07 mg/dL (ref 0.61–1.24)
Chloride: 98 mmol/L — ABNORMAL LOW (ref 101–111)
Chloride: 100 mmol/L — ABNORMAL LOW (ref 101–111)
Creatinine, Ser: 0.9 mg/dL (ref 0.61–1.24)
GFR calc Af Amer: >60 mL/min (ref >60)
GFR calc Af Amer: >60 mL/min (ref >60)
GFR calc non Af Amer: >60 mL/min (ref >60)
GLUCOSE: 149 mg/dL — AB (ref 65–99)
GLUCOSE: 130 mg/dL — AB (ref 65–99)
GLUCOSE: 187 mg/dL — AB (ref 65–99)
POTASSIUM: 2.8 mmol/L — AB (ref 3.5–5.1)
Potassium: 2.8 mmol/L — ABNORMAL LOW (ref 3.5–5.1)
Potassium: 3.2 mmol/L — ABNORMAL LOW (ref 3.5–5.1)
SODIUM: 137 mmol/L (ref 135–145)
Sodium: 137 mmol/L (ref 135–145)
Sodium: 138 mmol/L (ref 135–145)

## 2016-09-07 LAB — CBC
HCT: 34.4 % — ABNORMAL LOW (ref 40.0–52.0)
HEMATOCRIT: 36 % — AB (ref 40.0–52.0)
HEMOGLOBIN: 12.3 g/dL — AB (ref 13.0–18.0)
Hemoglobin: 11.9 g/dL — ABNORMAL LOW (ref 13.0–18.0)
MCH: 27.2 pg (ref 26.0–34.0)
MCH: 27.5 pg (ref 26.0–34.0)
MCHC: 34.2 g/dL (ref 32.0–36.0)
MCHC: 34.5 g/dL (ref 32.0–36.0)
MCV: 79.6 fL — AB (ref 80.0–100.0)
MCV: 79.5 fL — AB (ref 80.0–100.0)
PLATELETS: 239 10*3/uL (ref 150–440)
Platelets: 265 10*3/uL (ref 150–440)
RBC: 4.52 MIL/uL (ref 4.40–5.90)
RBC: 4.32 MIL/uL — AB (ref 4.40–5.90)
RDW: 17 % — ABNORMAL HIGH (ref 11.5–14.5)
RDW: 17.1 % — AB (ref 11.5–14.5)
WBC: 13.1 10*3/uL — ABNORMAL HIGH (ref 3.8–10.6)
WBC: 11.9 10*3/uL — AB (ref 3.8–10.6)

## 2016-09-07 LAB — MAGNESIUM: MAGNESIUM: 1.5 mg/dL — AB (ref 1.7–2.4)

## 2016-09-07 LAB — TSH: TSH: 3.06 u[IU]/mL (ref 0.350–4.500)

## 2016-09-07 LAB — TROPONIN I
Troponin I: <0.03 ng/mL (ref <0.03)
Troponin I: <0.03 ng/mL (ref <0.03)
Troponin I: <0.03 ng/mL (ref <0.03)

## 2016-09-07 LAB — GLUCOSE, CAPILLARY
GLUCOSE-CAPILLARY: 126 mg/dL — AB (ref 65–99)
GLUCOSE-CAPILLARY: 156 mg/dL — AB (ref 65–99)
Glucose-Capillary: 180 mg/dL — ABNORMAL HIGH (ref 65–99)
Glucose-Capillary: 234 mg/dL — ABNORMAL HIGH (ref 65–99)

## 2016-09-07 LAB — PHOSPHORUS: Phosphorus: 3.5 mg/dL (ref 2.5–4.6)

## 2016-09-07 MED ORDER — PANTOPRAZOLE SODIUM 40 MG PO TBEC
40.0000 mg | DELAYED_RELEASE_TABLET | Freq: Every day | ORAL | Status: DC
  Administered 2016-09-07 – 2016-09-09 (×3): 40 mg via ORAL
  Filled 2016-09-07 (×3): qty 1

## 2016-09-07 MED ORDER — MAGNESIUM CITRATE PO SOLN
1.0000 | Freq: Once | ORAL | Status: DC | PRN
  Filled 2016-09-07: qty 296

## 2016-09-07 MED ORDER — RIVAROXABAN 20 MG PO TABS
20.0000 mg | ORAL_TABLET | Freq: Every day | ORAL | Status: DC
  Administered 2016-09-07 – 2016-09-09 (×3): 20 mg via ORAL
  Filled 2016-09-07 (×3): qty 1

## 2016-09-07 MED ORDER — PNEUMOCOCCAL VAC POLYVALENT 25 MCG/0.5ML IJ INJ
0.5000 mL | INJECTION | INTRAMUSCULAR | Status: AC
  Administered 2016-09-08: 0.5 mL via INTRAMUSCULAR
  Filled 2016-09-07: qty 0.5

## 2016-09-07 MED ORDER — SODIUM CHLORIDE 0.9 % IV SOLN
30.0000 meq | Freq: Once | INTRAVENOUS | Status: AC
  Administered 2016-09-07: 30 meq via INTRAVENOUS
  Filled 2016-09-07 (×2): qty 15

## 2016-09-07 MED ORDER — ACETAMINOPHEN 650 MG RE SUPP: 650.0000 mg | Freq: Four times a day (QID) | RECTAL | Status: DC | PRN

## 2016-09-07 MED ORDER — CARVEDILOL 12.5 MG PO TABS
12.5000 mg | ORAL_TABLET | Freq: Two times a day (BID) | ORAL | Status: DC
  Administered 2016-09-07 – 2016-09-09 (×6): 12.5 mg via ORAL
  Filled 2016-09-07 (×6): qty 1

## 2016-09-07 MED ORDER — SODIUM CHLORIDE 0.9% FLUSH
3.0000 mL | Freq: Two times a day (BID) | INTRAVENOUS | Status: DC
  Administered 2016-09-07 – 2016-09-09 (×5): 3 mL via INTRAVENOUS

## 2016-09-07 MED ORDER — ONDANSETRON HCL 4 MG PO TABS: 4.0000 mg | ORAL_TABLET | Freq: Three times a day (TID) | ORAL | Status: DC | PRN

## 2016-09-07 MED ORDER — ONDANSETRON HCL 4 MG PO TABS: 4.0000 mg | ORAL_TABLET | Freq: Four times a day (QID) | ORAL | Status: DC | PRN

## 2016-09-07 MED ORDER — ACETAMINOPHEN 325 MG PO TABS: 650.0000 mg | ORAL_TABLET | Freq: Four times a day (QID) | ORAL | Status: DC | PRN

## 2016-09-07 MED ORDER — POTASSIUM CHLORIDE CRYS ER 20 MEQ PO TBCR
40.0000 meq | EXTENDED_RELEASE_TABLET | Freq: Once | ORAL | Status: AC
  Administered 2016-09-07: 40 meq via ORAL
  Filled 2016-09-07: qty 2

## 2016-09-07 MED ORDER — NITROGLYCERIN 0.4 MG SL SUBL: 0.4000 mg | SUBLINGUAL_TABLET | SUBLINGUAL | Status: DC | PRN

## 2016-09-07 MED ORDER — BALSALAZIDE DISODIUM 750 MG PO CAPS
750.0000 mg | ORAL_CAPSULE | Freq: Three times a day (TID) | ORAL | Status: DC
  Administered 2016-09-07 – 2016-09-09 (×7): 750 mg via ORAL
  Filled 2016-09-07 (×10): qty 1

## 2016-09-07 MED ORDER — AMIODARONE HCL 200 MG PO TABS
200.0000 mg | ORAL_TABLET | Freq: Every day | ORAL | Status: DC
  Administered 2016-09-07 – 2016-09-09 (×3): 200 mg via ORAL
  Filled 2016-09-07 (×3): qty 1

## 2016-09-07 MED ORDER — OXYCODONE HCL 5 MG PO TABS: 5.0000 mg | ORAL_TABLET | ORAL | Status: DC | PRN

## 2016-09-07 MED ORDER — INSULIN ASPART 100 UNIT/ML ~~LOC~~ SOLN: 0.0000 [IU] | Freq: Every day | SUBCUTANEOUS | Status: DC

## 2016-09-07 MED ORDER — NIACIN ER (ANTIHYPERLIPIDEMIC) 1000 MG PO TBCR: 1000.0000 mg | EXTENDED_RELEASE_TABLET | Freq: Every day | ORAL | Status: DC

## 2016-09-07 MED ORDER — SODIUM CHLORIDE 0.9 % IV SOLN
INTRAVENOUS | Status: DC
  Administered 2016-09-07: 01:00:00 via INTRAVENOUS

## 2016-09-07 MED ORDER — ONDANSETRON HCL 4 MG/2ML IJ SOLN
4.0000 mg | Freq: Four times a day (QID) | INTRAMUSCULAR | Status: DC | PRN
  Administered 2016-09-07: 4 mg via INTRAVENOUS
  Filled 2016-09-07: qty 2

## 2016-09-07 MED ORDER — TORSEMIDE 20 MG PO TABS
40.0000 mg | ORAL_TABLET | Freq: Two times a day (BID) | ORAL | Status: DC
  Administered 2016-09-07 – 2016-09-09 (×6): 40 mg via ORAL
  Filled 2016-09-07 (×6): qty 2

## 2016-09-07 MED ORDER — ALBUTEROL SULFATE (2.5 MG/3ML) 0.083% IN NEBU: 2.5000 mg | INHALATION_SOLUTION | Freq: Four times a day (QID) | RESPIRATORY_TRACT | Status: DC | PRN

## 2016-09-07 MED ORDER — SENNOSIDES-DOCUSATE SODIUM 8.6-50 MG PO TABS: 1.0000 | ORAL_TABLET | Freq: Every evening | ORAL | Status: DC | PRN

## 2016-09-07 MED ORDER — INSULIN ASPART 100 UNIT/ML ~~LOC~~ SOLN
0.0000 [IU] | Freq: Three times a day (TID) | SUBCUTANEOUS | Status: DC
Start: 2016-09-07 — End: 2016-09-09
  Administered 2016-09-07: 7 [IU] via SUBCUTANEOUS
  Administered 2016-09-07: 4 [IU] via SUBCUTANEOUS
  Administered 2016-09-07 – 2016-09-08 (×2): 3 [IU] via SUBCUTANEOUS
  Administered 2016-09-08 (×2): 4 [IU] via SUBCUTANEOUS
  Administered 2016-09-09: 7 [IU] via SUBCUTANEOUS
  Administered 2016-09-09: 11 [IU] via SUBCUTANEOUS
  Filled 2016-09-07: qty 4
  Filled 2016-09-07: qty 3
  Filled 2016-09-07: qty 7
  Filled 2016-09-07: qty 3
  Filled 2016-09-07: qty 4
  Filled 2016-09-07: qty 7
  Filled 2016-09-07: qty 4
  Filled 2016-09-07: qty 11

## 2016-09-07 MED ORDER — SACUBITRIL-VALSARTAN 49-51 MG PO TABS
1.0000 | ORAL_TABLET | Freq: Two times a day (BID) | ORAL | Status: DC
  Administered 2016-09-07 – 2016-09-09 (×5): 1 via ORAL
  Filled 2016-09-07 (×7): qty 1

## 2016-09-07 MED ORDER — NIACIN ER 500 MG PO CPCR
1000.0000 mg | ORAL_CAPSULE | Freq: Every day | ORAL | Status: DC
  Administered 2016-09-07 – 2016-09-08 (×2): 1000 mg via ORAL
  Filled 2016-09-07 (×3): qty 2

## 2016-09-07 MED ORDER — BISACODYL 5 MG PO TBEC: 5.0000 mg | DELAYED_RELEASE_TABLET | Freq: Every day | ORAL | Status: DC | PRN

## 2016-09-07 MED ORDER — MAGNESIUM SULFATE 2 GM/50ML IV SOLN
2.0000 g | Freq: Once | INTRAVENOUS | Status: AC
  Administered 2016-09-07: 2 g via INTRAVENOUS
  Filled 2016-09-07: qty 50

## 2016-09-07 MED ORDER — IPRATROPIUM BROMIDE 0.02 % IN SOLN: 0.5000 mg | Freq: Four times a day (QID) | RESPIRATORY_TRACT | Status: DC | PRN

## 2016-09-07 MED ORDER — ASPIRIN 81 MG PO CHEW
81.0000 mg | CHEWABLE_TABLET | Freq: Every day | ORAL | Status: DC
  Administered 2016-09-07 – 2016-09-09 (×3): 81 mg via ORAL
  Filled 2016-09-07 (×3): qty 1

## 2016-09-07 MED ORDER — ATORVASTATIN CALCIUM 20 MG PO TABS
80.0000 mg | ORAL_TABLET | Freq: Every day | ORAL | Status: DC
  Administered 2016-09-07 – 2016-09-09 (×3): 80 mg via ORAL
  Filled 2016-09-07 (×3): qty 4

## 2016-09-07 NOTE — Care Management Obs Status (Signed)
[place] NOTIFICATION   Patient Details  Name: Colton Lamb MRN: [id number] Date of Birth: Jul 03, 1951   Medicare Observation Status Notification Given:  Yes    Katrina Stack, RN 09/07/2016, 1:26 PM

## 2016-09-07 NOTE — Progress Notes (Signed)
Notified Dr. Estanislado Pandy of K level of 2.8 and magnesium level of 1.5. Oral and IV potassium ordered. IV magnesium ordered.

## 2016-09-07 NOTE — Progress Notes (Signed)
A&O. Up with one assist. Admitted through ED from home for Nausea and weakness. IV fluids in fusing. Medicated for nausea.

## 2016-09-07 NOTE — Progress Notes (Signed)
[facility] NAME: Colton Lamb    MR#:  [id number]  DATE OF BIRTH:  1951-01-19  SUBJECTIVE:  CHIEF COMPLAINT:   Chief Complaint  Patient presents with  . Emesis  . Urinary Frequency    Came with some dizziness, nausea and urinary symptoms with generalized weakness. Urinalysis is negative. No signs of infection. Troponin was slightly elevated so monitored on telemetry but remains asymptomatic. His potassium level was found to be low, replacing currently. There is a strong smell of urine in his room, but patient denies any urinary incontinence.Marland Kitchen REVIEW OF SYSTEMS:  CONSTITUTIONAL: No fever,Positive for fatigue or weakness.  EYES: No blurred or double vision.  EARS, NOSE, AND THROAT: No tinnitus or ear pain.  RESPIRATORY: No cough, shortness of breath, wheezing or hemoptysis.  CARDIOVASCULAR: No chest pain, orthopnea, edema.  GASTROINTESTINAL: No nausea, vomiting, diarrhea or abdominal pain.  GENITOURINARY: No dysuria, hematuria.  ENDOCRINE: No polyuria, nocturia,  HEMATOLOGY: No anemia, easy bruising or bleeding SKIN: No rash or lesion. MUSCULOSKELETAL: No joint pain or arthritis.   NEUROLOGIC: No tingling, numbness, weakness.  PSYCHIATRY: No anxiety or depression.   ROS  DRUG ALLERGIES:   Allergies  Allergen Reactions  . Iodine Other (See Comments)    Shortness of breath, swelling and hives  . Shrimp [Shellfish Allergy] Other (See Comments)    SWELLING    HIVES    SHORTNESS OF BREATH  . Tetracycline Rash    VITALS:  Blood pressure 99/69, pulse 74, temperature 98 F (36.7 C), temperature source Oral, resp. rate 12, height 5' 11"  (1.803 m), weight 124.8 kg (275 lb 1.6 oz), SpO2 96 %.  PHYSICAL EXAMINATION:  GENERAL:  66 y.o.-year-old patient lying in the bed with no acute distress.  EYES: Pupils equal, round, reactive to light and accommodation. No scleral icterus. Extraocular muscles intact.  HEENT: Head atraumatic,  normocephalic. Oropharynx and nasopharynx clear.  NECK:  Supple, no jugular venous distention. No thyroid enlargement, no tenderness.  LUNGS: Normal breath sounds bilaterally, no wheezing, rales,rhonchi or crepitation. No use of accessory muscles of respiration.  CARDIOVASCULAR: S1, S2 normal. No murmurs, rubs, or gallops.  ABDOMEN: Soft, nontender, nondistended. Bowel sounds present. No organomegaly or mass.  EXTREMITIES: No pedal edema, cyanosis, or clubbing.  NEUROLOGIC: Cranial nerves II through XII are intact. Muscle strength 5/5 in all extremities. Sensation intact. Gait not checked. He has some nystagmus and noted some tremors. PSYCHIATRIC: The patient is alert and oriented x 3.  SKIN: No obvious rash, lesion, or ulcer.   Physical Exam LABORATORY PANEL:   CBC  Recent Labs Lab 09/07/16 0704  WBC 11.9*  HGB 11.9*  HCT 34.4*  PLT 239   ------------------------------------------------------------------------------------------------------------------  Chemistries   Recent Labs Lab 09/06/16 1539 09/07/16 0102  09/07/16 1256  NA 138 137  < > 138  K 2.8* 2.8*  < > 3.2*  CL 93* 97*  < > 100*  CO2 32 29  < > 29  GLUCOSE 157* 149*  < > 187*  BUN 14 13  < > 13  CREATININE 0.98 0.90  < > 1.07  CALCIUM 8.7* 8.3*  < > 8.1*  MG  --  1.5*  --   --   AST 25  --   --   --   ALT 15*  --   --   --   ALKPHOS 117  --   --   --   BILITOT 0.7  --   --   --   < > =  values in this interval not displayed. ------------------------------------------------------------------------------------------------------------------  Cardiac Enzymes  Recent Labs Lab 09/07/16 0704 09/07/16 1256  TROPONINI <0.03 <0.03   ------------------------------------------------------------------------------------------------------------------  RADIOLOGY:  Ct Head Wo Contrast  Result Date: 09/07/2016 CLINICAL DATA:  Nausea, weakness and dizziness. EXAM: CT HEAD WITHOUT CONTRAST TECHNIQUE: Contiguous axial  images were obtained from the base of the skull through the vertex without intravenous contrast. COMPARISON:  None. FINDINGS: Brain: Mild generalized atrophy. Ventricular prominence consistent with central atrophy. Normal pressure hydrocephalus not excluded but not specifically suggested. No evidence of focal brain insult, mass, hemorrhage or extra-axial collection. Vascular: There is atherosclerotic calcification of the major vessels at the base of the brain. Skull: Normal Sinuses/Orbits: Sinuses are clear except for an insignificant retention cyst in the right sphenoid sinus. No inflammatory changes. Orbits negative. Other: None significant IMPRESSION: No focal finding. Mild brain atrophy. Ventricular prominence felt secondary to central atrophy. The possibility of normal pressure hydrocephalus does exist, but is not strongly suggested. Electronically Signed   By: Nelson Chimes M.D.   On: 09/07/2016 12:57    ASSESSMENT AND PLAN:   Active Problems:   Elevated troponin  * Elevated troponin   Monitor on telemetry, follow serial troponins remained negative. Which rules out for acute coronary syndrome.  * Hypokalemia, hypomagnesemia.   Replace IV and by mouth. Corrected some.  * Elevated white blood cell count. Patient also had nausea.   source of infection, urinalysis is negative.   Check chest x-ray. Follow the results.  * History of atrial fibrillation   Continue amiodarone, Coreg, xarelto.  * History of coronary artery disease, chronic systolic CHF status post AICD.   Continue Coreg, torsemide, enteresto, Lipitor, aspirin.  * Hypertension   Continue Coreg, enteresto.  * Diabetes   Sliding scale coverage.  * Complain of dizziness   MRI brain cannot be done because of presence of AICD   CT scan of the head shows possibility of normal pressure hydrocephalus   Call neurologic consult. Get physical therapy evaluation.  All the records are reviewed and case discussed with Care  Management/Social Workerr. Management plans discussed with the patient, family and they are in agreement.  CODE STATUS: Full.  TOTAL TIME TAKING CARE OF THIS PATIENT: 40 minutes.     POSSIBLE D/C IN 1-2 DAYS, DEPENDING ON CLINICAL CONDITION.   Vaughan Basta M.D on 09/07/2016   Between 7am to 6pm - Pager - [phone]  After 6pm go to [email or web address] - password [email or web address] [facility]  Office  [phone]  CC: Primary care physician; Sherrin Daisy, MD  Note: This dictation was prepared with Dragon dictation along with smaller phrase technology. Any transcriptional errors that result from this process are unintentional.

## 2016-09-07 NOTE — Care Management (Signed)
Placed in observation for elevated troponin.  Had some dizziness since admission for for head CT to rule out neurological diagnosis.  Prior to this presentation, lived alone  Independent in [place], denies issues accessing medical care, obtaining medications or with transportation.  Current with her PCP.  No discharge needs identified at present by care manager or members of care team

## 2016-09-07 NOTE — Evaluation (Signed)
Physical Therapy Evaluation Patient Details Name: Colton Lamb MRN: [id number] DOB: 22-Sep-1950 Today's Date: 09/07/2016   History of Present Illness  presented to ER and admitted under observation secondary to emesis, urinary frequency, repeated dizziness.  Clinical Impression  Upon evaluation, patient alert and oriented to basic information; follows simple commands, though demonstrates some difficulty with higher-level comprehension and problem-solving.  Bilat UE/LE strength and ROM grossly symmetrical and WFL; no significant focal weakness or sensory deficit. However, mild L UE drift, R UE coordination deficit noted during evaluation.  Currently requiring min assist for bed mobility; maintains unsupported static sitting balance with mod indep.  Unable to tolerate attempts at standing or OOB due to dizziness reported throughout evaluation.  Vital signs stable and WFL (no orthostasis noted). Patient noted with significant resting nystagmus (R upbeating, torsional) with absent L eye convergence and mild/moderately dysconjugate gaze (L eye slower).  Question possible central pathology contributing to patient's dizziness?  Discussed with primary RN, MD; may benefit from diagnostic imaging and neurological consult to rule out neurological insult. Would benefit from skilled PT to address above deficits and promote optimal return to PLOF; recommend transition to STR upon discharge from acute hospitalization, as patient currently unable to tolerate any OOB attempts, unable to demonstrate safe mobility for return home alone.    Follow Up Recommendations SNF    Equipment Recommendations  Rolling walker with 5" wheels    Recommendations for Other Services       Precautions / Restrictions Precautions Precautions: Fall Restrictions Weight Bearing Restrictions: No      Mobility  Bed Mobility Overal bed mobility: Needs Assistance Bed Mobility: Supine to Sit     Supine to sit: Min assist      General bed mobility comments: assist for truncal elevation with transition towards R side of bed  Transfers                 General transfer comment: unsafe/unable to tolerate attempts at standing due to worsening dizziness with mobility/transition to upright (vitals stable)  Ambulation/Gait             General Gait Details: unsafe/unable to tolerate attempts at standing due to worsening dizziness with mobility/transition to upright (vitals stable)  Stairs            Wheelchair Mobility    Modified Rankin (Stroke Patients Only)       Balance Overall balance assessment: Needs assistance Sitting-balance support: No upper extremity supported;Feet supported Sitting balance-Leahy Scale: Fair                                       Pertinent Vitals/Pain Pain Assessment: No/denies pain    Home Living Family/patient expects to be discharged to:: Private residence Living Arrangements: Alone   Type of Home: House Home Access: Stairs to enter Entrance Stairs-Rails: Right Entrance Stairs-Number of Steps: 3 Home Layout: One level Home Equipment: None      Prior Function Level of Independence: Independent         Comments: Indep with ADLs, household and community mobility; + driving; denies fall history.     Hand Dominance        Extremity/Trunk Assessment   Upper Extremity Assessment Upper Extremity Assessment:  (questionable mild coordination deficit possible R UE, mild drift to L UE; strength grossly symmetrical with no focal weakness, no sensory loss reported)    Lower Extremity Assessment Lower  Extremity Assessment:  (grossly WFL and symmetrical throughout bilat LEs; denies sensory deficit)       Communication   Communication: No difficulties  Cognition Arousal/Alertness: Awake/alert Behavior During Therapy: WFL for tasks assessed/performed Overall Cognitive Status: Within Functional Limits for tasks assessed                  General Comments: mild difficulty with comprehension of higher-level information    General Comments      Exercises Other Exercises Other Exercises: Vestibular assessment: significant for resting R torsional, upbeating nystagmus bilat (L > R), worsened with transition to upright and does not appear to resolve with accommodation to position change (or return to supine).  Persistent nystagmus in all fields with visual tracking (does not worsen or diminish, does not change direction).  Scanning generally saccadic with somewhat dysconjugate gaze (L eye slightly delayed) noted with visual tracking; absent convergence to L eye.  Patient unaware of above-noted symptoms.  Question possible central pathology contributing to patient's dizziness?  Discussed with primary RN, MD; may benefit from diagnostic imaging and neurological consult to rule out neurological insult.   Assessment/Plan    PT Assessment Patient needs continued PT services  PT Problem List Decreased balance;Decreased mobility;Decreased cognition          PT Treatment Interventions DME instruction;Gait training;Stair training;Functional mobility training;Therapeutic activities;Therapeutic exercise;Balance training;Neuromuscular re-education;Patient/family education    PT Goals (Current goals can be found in the Care Plan section)  Acute Rehab PT Goals Patient Stated Goal: to get out of the hospital PT Goal Formulation: With patient Time For Goal Achievement: 09/21/16 Potential to Achieve Goals: Good    Frequency Min 2X/week   Barriers to discharge Decreased caregiver support      Co-evaluation               End of Session Equipment Utilized During Treatment: Gait belt Activity Tolerance: Patient tolerated treatment well Patient left: in bed;with call bell/phone within reach;with bed alarm set Nurse Communication: Mobility status (physical presentation, benefit of imaging/neuro consult)    Functional  Assessment Tool Used: clinical judgement Functional Limitation: Mobility: Walking and moving around Mobility: Walking and Moving Around Current Status (Y0737): At least 60 percent but less than 80 percent impaired, limited or restricted Mobility: Walking and Moving Around Goal Status [phone]): At least 1 percent but less than 20 percent impaired, limited or restricted    Time: 1102-1128 PT Time Calculation (min) (ACUTE ONLY): 26 min   Charges:   PT Evaluation $PT Eval Moderate Complexity: 1 Procedure PT Treatments $Therapeutic Activity: 8-22 mins   PT G Codes:   PT G-Codes **NOT FOR INPATIENT CLASS** Functional Assessment Tool Used: clinical judgement Functional Limitation: Mobility: Walking and moving around Mobility: Walking and Moving Around Current Status (R4854): At least 60 percent but less than 80 percent impaired, limited or restricted Mobility: Walking and Moving Around Goal Status [phone]): At least 1 percent but less than 20 percent impaired, limited or restricted    Carmyn Hamm H. Owens Shark, PT, DPT, NCS 09/07/16, 10:54 PM [phone]

## 2016-09-07 NOTE — Progress Notes (Signed)
Chaplain visited patient while rounding around the unit. Patient has a charming personality. He lost his wife by cancer five years ago. He has different health issues concerns. He lives by himself and his children and their family do not live nearby.

## 2016-09-08 ENCOUNTER — Inpatient Hospital Stay: Payer: Medicare Other

## 2016-09-08 ENCOUNTER — Observation Stay: Payer: Medicare Other

## 2016-09-08 DIAGNOSIS — Z955 Presence of coronary angioplasty implant and graft: Secondary | ICD-10-CM | POA: Diagnosis not present

## 2016-09-08 DIAGNOSIS — I248 Other forms of acute ischemic heart disease: Secondary | ICD-10-CM | POA: Diagnosis present

## 2016-09-08 DIAGNOSIS — I480 Paroxysmal atrial fibrillation: Secondary | ICD-10-CM | POA: Diagnosis present

## 2016-09-08 DIAGNOSIS — Z794 Long term (current) use of insulin: Secondary | ICD-10-CM | POA: Diagnosis not present

## 2016-09-08 DIAGNOSIS — Z79899 Other long term (current) drug therapy: Secondary | ICD-10-CM | POA: Diagnosis not present

## 2016-09-08 DIAGNOSIS — E785 Hyperlipidemia, unspecified: Secondary | ICD-10-CM | POA: Diagnosis present

## 2016-09-08 DIAGNOSIS — Z7901 Long term (current) use of anticoagulants: Secondary | ICD-10-CM | POA: Diagnosis not present

## 2016-09-08 DIAGNOSIS — Z881 Allergy status to other antibiotic agents status: Secondary | ICD-10-CM | POA: Diagnosis not present

## 2016-09-08 DIAGNOSIS — R42 Dizziness and giddiness: Secondary | ICD-10-CM

## 2016-09-08 DIAGNOSIS — R112 Nausea with vomiting, unspecified: Secondary | ICD-10-CM | POA: Diagnosis present

## 2016-09-08 DIAGNOSIS — Z23 Encounter for immunization: Secondary | ICD-10-CM | POA: Diagnosis present

## 2016-09-08 DIAGNOSIS — I255 Ischemic cardiomyopathy: Secondary | ICD-10-CM | POA: Diagnosis present

## 2016-09-08 DIAGNOSIS — Z7984 Long term (current) use of oral hypoglycemic drugs: Secondary | ICD-10-CM | POA: Diagnosis not present

## 2016-09-08 DIAGNOSIS — I110 Hypertensive heart disease with heart failure: Secondary | ICD-10-CM | POA: Diagnosis present

## 2016-09-08 DIAGNOSIS — Z7982 Long term (current) use of aspirin: Secondary | ICD-10-CM | POA: Diagnosis not present

## 2016-09-08 DIAGNOSIS — I5022 Chronic systolic (congestive) heart failure: Secondary | ICD-10-CM | POA: Diagnosis present

## 2016-09-08 DIAGNOSIS — E119 Type 2 diabetes mellitus without complications: Secondary | ICD-10-CM | POA: Diagnosis present

## 2016-09-08 DIAGNOSIS — R269 Unspecified abnormalities of gait and mobility: Secondary | ICD-10-CM

## 2016-09-08 DIAGNOSIS — Z95810 Presence of automatic (implantable) cardiac defibrillator: Secondary | ICD-10-CM | POA: Diagnosis not present

## 2016-09-08 DIAGNOSIS — K219 Gastro-esophageal reflux disease without esophagitis: Secondary | ICD-10-CM | POA: Diagnosis present

## 2016-09-08 DIAGNOSIS — K5080 Crohn's disease of both small and large intestine without complications: Secondary | ICD-10-CM | POA: Diagnosis present

## 2016-09-08 DIAGNOSIS — I2510 Atherosclerotic heart disease of native coronary artery without angina pectoris: Secondary | ICD-10-CM | POA: Diagnosis present

## 2016-09-08 DIAGNOSIS — I4892 Unspecified atrial flutter: Secondary | ICD-10-CM | POA: Diagnosis present

## 2016-09-08 DIAGNOSIS — G919 Hydrocephalus, unspecified: Secondary | ICD-10-CM | POA: Diagnosis present

## 2016-09-08 DIAGNOSIS — E662 Morbid (severe) obesity with alveolar hypoventilation: Secondary | ICD-10-CM | POA: Diagnosis present

## 2016-09-08 LAB — PHOSPHORUS: PHOSPHORUS: 3.1 mg/dL (ref 2.5–4.6)

## 2016-09-08 LAB — HEMOGLOBIN A1C
Hgb A1c MFr Bld: 7.5 % — ABNORMAL HIGH (ref 4.8–5.6)
Mean Plasma Glucose: 169 mg/dL

## 2016-09-08 LAB — BASIC METABOLIC PANEL
Anion gap: 8 (ref 5–15)
BUN: 15 mg/dL (ref 6–20)
CALCIUM: 8 mg/dL — AB (ref 8.9–10.3)
CO2: 30 mmol/L (ref 22–32)
CREATININE: 1.08 mg/dL (ref 0.61–1.24)
Chloride: 100 mmol/L — ABNORMAL LOW (ref 101–111)
GFR calc Af Amer: >60 mL/min (ref >60)
GFR calc non Af Amer: >60 mL/min (ref >60)
GLUCOSE: 149 mg/dL — AB (ref 65–99)
Potassium: 2.9 mmol/L — ABNORMAL LOW (ref 3.5–5.1)
Sodium: 138 mmol/L (ref 135–145)

## 2016-09-08 LAB — GLUCOSE, CAPILLARY
GLUCOSE-CAPILLARY: 175 mg/dL — AB (ref 65–99)
GLUCOSE-CAPILLARY: 159 mg/dL — AB (ref 65–99)
Glucose-Capillary: 129 mg/dL — ABNORMAL HIGH (ref 65–99)
Glucose-Capillary: 191 mg/dL — ABNORMAL HIGH (ref 65–99)

## 2016-09-08 LAB — URINE CULTURE
Culture: NO GROWTH
Special Requests: NORMAL

## 2016-09-08 LAB — MAGNESIUM: Magnesium: 1.6 mg/dL — ABNORMAL LOW (ref 1.7–2.4)

## 2016-09-08 LAB — POTASSIUM: Potassium: 3.1 mmol/L — ABNORMAL LOW (ref 3.5–5.1)

## 2016-09-08 MED ORDER — SODIUM CHLORIDE 0.9 % IV SOLN
30.0000 meq | Freq: Once | INTRAVENOUS | Status: AC
  Administered 2016-09-08: 30 meq via INTRAVENOUS
  Filled 2016-09-08: qty 15

## 2016-09-08 MED ORDER — POTASSIUM CHLORIDE CRYS ER 20 MEQ PO TBCR
40.0000 meq | EXTENDED_RELEASE_TABLET | Freq: Four times a day (QID) | ORAL | Status: AC
  Administered 2016-09-08 (×2): 40 meq via ORAL
  Filled 2016-09-08 (×2): qty 2

## 2016-09-08 MED ORDER — DIPHENHYDRAMINE HCL 25 MG PO CAPS
50.0000 mg | ORAL_CAPSULE | Freq: Once | ORAL | Status: AC
  Administered 2016-09-09: 50 mg via ORAL
  Filled 2016-09-08: qty 2

## 2016-09-08 MED ORDER — DIPHENHYDRAMINE HCL 25 MG PO CAPS
50.0000 mg | ORAL_CAPSULE | Freq: Once | ORAL | Status: DC
  Filled 2016-09-08: qty 2

## 2016-09-08 MED ORDER — PREDNISONE 50 MG PO TABS
50.0000 mg | ORAL_TABLET | Freq: Four times a day (QID) | ORAL | Status: DC
  Filled 2016-09-08: qty 1

## 2016-09-08 MED ORDER — MAGNESIUM SULFATE 4 GM/100ML IV SOLN
4.0000 g | Freq: Once | INTRAVENOUS | Status: AC
  Administered 2016-09-08: 4 g via INTRAVENOUS
  Filled 2016-09-08: qty 100

## 2016-09-08 MED ORDER — PREDNISONE 50 MG PO TABS
50.0000 mg | ORAL_TABLET | Freq: Three times a day (TID) | ORAL | Status: AC
  Administered 2016-09-08 – 2016-09-09 (×3): 50 mg via ORAL
  Filled 2016-09-08 (×3): qty 1

## 2016-09-08 NOTE — Progress Notes (Signed)
Family Meeting Note  Advance Directive:yes  Today a meeting took place with the Patient.  The following clinical team members were present during this meeting:MD  The following were discussed:Patient's diagnosis: , Patient's progosis: > 12 months and Goals for treatment: Full Code  Additional follow-up to be provided: none  Time spent during discussion:20 minutes  Max Sane, MD

## 2016-09-08 NOTE — Progress Notes (Addendum)
[facility] [place] NAME: Colton Lamb    MR#:  [id number]  DATE OF BIRTH:  1951-03-08  SUBJECTIVE:  CHIEF COMPLAINT:   Chief Complaint  Patient presents with  . Emesis  . Urinary Frequency  feels dizzi, K low REVIEW OF SYSTEMS:  CONSTITUTIONAL: No fever,Positive for fatigue or weakness.  EYES: No blurred or double vision.  EARS, NOSE, AND THROAT: No tinnitus or ear pain.  RESPIRATORY: No cough, shortness of breath, wheezing or hemoptysis.  CARDIOVASCULAR: No chest pain, orthopnea, edema.  GASTROINTESTINAL: No nausea, vomiting, diarrhea or abdominal pain.  GENITOURINARY: No dysuria, hematuria.  ENDOCRINE: No polyuria, nocturia,  HEMATOLOGY: No anemia, easy bruising or bleeding SKIN: No rash or lesion. MUSCULOSKELETAL: No joint pain or arthritis.   NEUROLOGIC: No tingling, numbness, weakness.  PSYCHIATRY: No anxiety or depression.   ROS  DRUG ALLERGIES:   Allergies  Allergen Reactions  . Iodine Other (See Comments)    Shortness of breath, swelling and hives  . Shrimp [Shellfish Allergy] Other (See Comments)    SWELLING    HIVES    SHORTNESS OF BREATH  . Tetracycline Rash    VITALS:  Blood pressure 107/69, pulse 70, temperature 97.5 F (36.4 C), resp. rate 12, height 5' 11"  (1.803 m), weight 124.8 kg (275 lb 1.6 oz), SpO2 97 %.  PHYSICAL EXAMINATION:  GENERAL:  66 y.o.-year-old patient lying in the bed with no acute distress.  EYES: Pupils equal, round, reactive to light and accommodation. No scleral icterus. Extraocular muscles intact.  HEENT: Head atraumatic, normocephalic. Oropharynx and nasopharynx clear.  NECK:  Supple, no jugular venous distention. No thyroid enlargement, no tenderness.  LUNGS: Normal breath sounds bilaterally, no wheezing, rales,rhonchi or crepitation. No use of accessory muscles of respiration.  CARDIOVASCULAR: S1, S2 normal. No murmurs, rubs, or gallops.  ABDOMEN: Soft, nontender, nondistended.  Bowel sounds present. No organomegaly or mass.  EXTREMITIES: No pedal edema, cyanosis, or clubbing.  NEUROLOGIC: Cranial nerves II through XII are intact. Muscle strength 5/5 in all extremities. Sensation intact. Gait not checked. He has some nystagmus and noted some tremors. PSYCHIATRIC: The patient is alert and oriented x 3.  SKIN: No obvious rash, lesion, or ulcer.  Physical Exam LABORATORY PANEL:   CBC  Recent Labs Lab 09/07/16 0704  WBC 11.9*  HGB 11.9*  HCT 34.4*  PLT 239   ------------------------------------------------------------------------------------------------------------------  Chemistries   Recent Labs Lab 09/06/16 1539  09/08/16 0535  NA 138  < > 138  K 2.8*  < > 2.9*  CL 93*  < > 100*  CO2 32  < > 30  GLUCOSE 157*  < > 149*  BUN 14  < > 15  CREATININE 0.98  < > 1.08  CALCIUM 8.7*  < > 8.0*  MG  --   < > 1.6*  AST 25  --   --   ALT 15*  --   --   ALKPHOS 117  --   --   BILITOT 0.7  --   --   < > = values in this interval not displayed. ------------------------------------------------------------------------------------------------------------------  Cardiac Enzymes  Recent Labs Lab 09/07/16 0704 09/07/16 1256  TROPONINI <0.03 <0.03   ------------------------------------------------------------------------------------------------------------------  RADIOLOGY:  Dg Chest 2 View  Result Date: 09/08/2016 CLINICAL DATA:  Weakness EXAM: CHEST  2 VIEW COMPARISON:  12/06/2015 FINDINGS: Left-sided dual lead pacing device similar in position. No focal consolidation. Possible small amount of left pleural effusion or thickening. Moderate cardiomegaly without  overt failure. No pneumothorax. IMPRESSION: 1. Cardiomegaly without overt failure. 2. Small amount of pleural fluid or thickening. No acute infiltrate. Electronically Signed   By: Donavan Foil M.D.   On: 09/08/2016 03:48   Ct Head Wo Contrast  Result Date: 09/07/2016 CLINICAL DATA:  Nausea, weakness  and dizziness. EXAM: CT HEAD WITHOUT CONTRAST TECHNIQUE: Contiguous axial images were obtained from the base of the skull through the vertex without intravenous contrast. COMPARISON:  None. FINDINGS: Brain: Mild generalized atrophy. Ventricular prominence consistent with central atrophy. Normal pressure hydrocephalus not excluded but not specifically suggested. No evidence of focal brain insult, mass, hemorrhage or extra-axial collection. Vascular: There is atherosclerotic calcification of the major vessels at the base of the brain. Skull: Normal Sinuses/Orbits: Sinuses are clear except for an insignificant retention cyst in the right sphenoid sinus. No inflammatory changes. Orbits negative. Other: None significant IMPRESSION: No focal finding. Mild brain atrophy. Ventricular prominence felt secondary to central atrophy. The possibility of normal pressure hydrocephalus does exist, but is not strongly suggested. Electronically Signed   By: Nelson Chimes M.D.   On: 09/07/2016 12:57    ASSESSMENT AND PLAN:   Active Problems:   Elevated troponin   Hydrocephalus  * Dizziness   MRI brain cannot be done because of presence of AICD - Appreciate Neuro input - Continue Xarelto and ASA.  - Can not rule out posterior circulation TIA versus BPPV. - EEG, orthostatics vitals  * Elevated troponin  due to supply demand ischemia  * Hypokalemia, hypomagnesemia.   Replete and recheck  * Elevated white blood cell count. Patient also had nausea.   source of infection, urinalysis is negative.  chest x-ray shows no acute patho  * History of atrial fibrillation   Continue amiodarone, Coreg, xarelto.  * History of coronary artery disease, chronic systolic CHF status post AICD.   Continue Coreg, torsemide, enteresto, Lipitor, aspirin.  * Hypertension   Continue Coreg, enteresto.  * Diabetes   Sliding scale coverage.   PT recommends STR, patient prefers to go home although he says he will consider STR if  needed D/W SW  All the records are reviewed and case discussed with Care Management/Social Workerr. Management plans discussed with the patient, Nursing and they are in agreement.  CODE STATUS: Full.  TOTAL TIME TAKING CARE OF THIS PATIENT: 40 minutes.     POSSIBLE D/C IN 1-2 DAYS, DEPENDING ON CLINICAL CONDITION.   Max Sane M.D on 09/08/2016   Between 7am to 6pm - Pager - [phone]  After 6pm go to [email or web address] - password [email or web address] [facility]  Office  [phone]  CC: Primary care physician; Sherrin Daisy, MD  Note: This dictation was prepared with Dragon dictation along with smaller phrase technology. Any transcriptional errors that result from this process are unintentional.

## 2016-09-08 NOTE — Consult Note (Signed)
Reason for Consult:Vertigo, difficulty with gait Referring Physician: Manuella Ghazi  CC: Vertigo, difficulty with gait  HPI: Colton Lamb is an 66 y.o. male with  Multiple medical problems who reports that about 2 weeks ago he began to have intermittent episodes of dizziness. He describes the dizziness as vertigo and being somewhat positional.  Patient reports associated nausea and dry heaving.  First episode lasted about a day and patient did well for about a week before he head another.  Patient's episode that led to this hospitalization was worse for him in that he also had difficulty with gait.  Patient was admitted for further evaluation.   Patient is on Xarelto and ASA.   Patient describes no memory deficits and no urinary incontinence although he does report that when he is dry heaving at times he does urinate on himself due to his inabillty to hold his urine.    Past Medical History:  Diagnosis Date  . Atrial flutter ([place])    a. s/p Cardioversion 11/22/13, on amiodarone and Xarelto.  . Chronic systolic heart failure ([place])    a. 10/2013 EF 20-25%, grade III DD, RV mildly dilated and sys fx mild/mod reduced;  b. 01/2014 Echo: EF 30-35%, gr3 DD, mod dil LA.  Marland Kitchen Coronary artery disease    a. s/p MI 2007/2015;  b. s/p prior PCI to the LAD/LCX/PDA/PL;  c. 2008: s/p Cypher DES to the OM.  Marland Kitchen Crohn's ileocolitis ([place])   . GERD (gastroesophageal reflux disease)   . Hx of adenomatous colonic polyps 11/2003  . Hyperlipidemia   . Hypertension   . Ischemic cardiomyopathy    s. 01/2014 s/p MDT [id number] [name] XT DR single lead AICD.  Marland Kitchen Obesity   . Paroxysmal atrial fibrillation (H[facility])    a. CHA2DS2VASc = 4-->xarelto/amio.  . Sleep apnea   . Syncope    a.  11/2013 in setting of volume depletion and bradycardia due to dig toxicity   . Type II diabetes mellitus ([place])     Past Surgical History:  Procedure Laterality Date  . ATRIAL FLUTTER ABLATION N/A 04/16/2014   Procedure: ATRIAL FLUTTER ABLATION;   Surgeon: Evans Lance, MD;  Location: [facility] CATH LAB;  Service: Cardiovascular;  Laterality: N/A;  . CARDIAC CATHETERIZATION  10/2013  . CARDIAC DEFIBRILLATOR PLACEMENT  04/16/2014   Medtronic Evira device  . CARDIAC ELECTROPHYSIOLOGY STUDY AND ABLATION  04/16/2014   atrial flutter ablation  . CARDIOVERSION N/A 03/05/2014   Procedure: CARDIOVERSION;  Surgeon: Jolaine Artist, MD;  Location: [facility] ENDOSCOPY;  Service: Cardiovascular;  Laterality: N/A;  . CORONARY ANGIOPLASTY WITH STENT PLACEMENT  2007; 2008 X 2   "1+1 ~ 1"  . FOOT SURGERY Left    bone spur  . HYDROCELE EXCISION Bilateral   . 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 IMPLANTABLE CARDIOVERTER DEFIBRILLATOR IMPLANT N/A 04/16/2014   Procedure: IMPLANTABLE CARDIOVERTER DEFIBRILLATOR IMPLANT;  Surgeon: Evans Lance, MD;  Location: [facility] CATH LAB;  Service: Cardiovascular;  Laterality: N/A;  . LEFT HEART CATHETERIZATION WITH CORONARY ANGIOGRAM N/A 11/22/2013   Procedure: LEFT HEART CATHETERIZATION WITH CORONARY ANGIOGRAM;  Surgeon: Sinclair Grooms, MD;  Location: [facility] CATH LAB;  Service: Cardiovascular;  Laterality: N/A;    Family History  Problem Relation Age of Onset  . Breast cancer Mother   . Heart disease Father   . Heart attack Father   . Colon cancer Neg Hx     Social History:  reports that he has never smoked.  He has never used smokeless tobacco. He reports that he does not drink alcohol or use drugs.  Allergies  Allergen Reactions  . Iodine Other (See Comments)    Shortness of breath, swelling and hives  . Shrimp [Shellfish Allergy] Other (See Comments)    SWELLING    HIVES    SHORTNESS OF BREATH  . Tetracycline Rash    Medications:  I have reviewed the patient's current medications. Prior to Admission:  Prescriptions Prior to Admission  Medication Sig Dispense Refill Last Dose  . amiodarone (PACERONE) 200 MG tablet TAKE 1 TABLET BY MOUTH MON.-FRIDAY AND 1/2 TABLET BY MOUTH SATURDAY  AND SUNDAY. 30 tablet 7 09/05/2016 at am  . aspirin 81 MG tablet Take 81 mg by mouth daily.    09/05/2016 at am  . atorvastatin (LIPITOR) 80 MG tablet TAKE 1 TABLET (80 MG TOTAL) BY MOUTH DAILY. 30 tablet 3 09/04/2016 at qhs  . balsalazide (COLAZAL) 750 MG capsule TAKE 1 CAPSULE (750 MG TOTAL) BY MOUTH 3 (THREE) TIMES DAILY. 90 capsule 11 09/05/2016 at noon  . carvedilol (COREG) 12.5 MG tablet TAKE 1 TABLET (12.5 MG TOTAL) BY MOUTH 2 (TWO) TIMES DAILY. 60 tablet 3 09/05/2016 at am  . ENTRESTO 49-51 MG TAKE 1 TABLET BY MOUTH 2 (TWO) TIMES DAILY. 60 tablet 3 09/05/2016 at am  . metFORMIN (GLUCOPHAGE) 1000 MG tablet Take 1,000 mg by mouth 2 (two) times daily with a meal.    09/05/2016 at am  . nitroGLYCERIN (NITROSTAT) 0.4 MG SL tablet Place 0.4 mg under the tongue every 5 (five) minutes as needed (MAX 3 TABLETS). FOR CHEST PAIN   prn at prn  . omeprazole (PRILOSEC) 20 MG capsule Take 20 mg by mouth daily.    09/05/2016 at am  . ondansetron (ZOFRAN) 4 MG tablet Take 1 tablet (4 mg total) by mouth every 8 (eight) hours as needed for nausea or vomiting. 21 tablet 0 09/05/2016 at prn  . torsemide (DEMADEX) 20 MG tablet TAKE 2 TABLETS (40 MG TOTAL) BY MOUTH 2 (TWO) TIMES DAILY. 120 tablet 5 09/05/2016 at am  . XARELTO 20 MG TABS tablet TAKE 1 TABLET BY MOUTH EVERY DAY WITH LUNCH 30 tablet 3 09/05/2016 at am  . acetaminophen (TYLENOL) 325 MG tablet Take 1-2 tablets (325-650 mg total) by mouth every 4 (four) hours as needed for mild pain.   Taking  . niacin (NIASPAN) 1000 MG CR tablet TAKE 1 TABLET BY MOUTH AT BEDTIME 30 tablet 6 09/04/2016 at qhs   Scheduled: . amiodarone  200 mg Oral Daily  . aspirin  81 mg Oral Daily  . atorvastatin  80 mg Oral q1800  . balsalazide  750 mg Oral TID  . carvedilol  12.5 mg Oral BID WC  . insulin aspart  0-20 Units Subcutaneous TID WC  . insulin aspart  0-5 Units Subcutaneous QHS  . magnesium sulfate 1 - 4 g bolus IVPB  4 g Intravenous Once  . niacin  1,000 mg Oral QHS  . pantoprazole  40  mg Oral Daily  . potassium chloride (KCL MULTIRUN) 30 mEq in 265 mL IVPB  30 mEq Intravenous Once  . potassium chloride  40 mEq Oral Q6H  . rivaroxaban  20 mg Oral Daily  . sacubitril-valsartan  1 tablet Oral BID  . sodium chloride flush  3 mL Intravenous Q12H  . torsemide  40 mg Oral BID    ROS: History obtained from the patient  General ROS: negative for - chills, fatigue,  fever, night sweats, weight gain or weight loss Psychological ROS: negative for - behavioral disorder, hallucinations, memory difficulties, mood swings or suicidal ideation Ophthalmic ROS: negative for - blurry vision, double vision, eye pain or loss of vision ENT ROS: as noted in HPI Allergy and Immunology ROS: negative for - hives or itchy/watery eyes Hematological and Lymphatic ROS: negative for - bleeding problems, bruising or swollen lymph nodes Endocrine ROS: negative for - galactorrhea, hair pattern changes, polydipsia/polyuria or temperature intolerance Respiratory ROS: negative for - cough, hemoptysis, shortness of breath or wheezing Cardiovascular ROS: negative for - chest pain, dyspnea on exertion, edema or irregular heartbeat Gastrointestinal ROS: negative for - abdominal pain, diarrhea, hematemesis, nausea/vomiting or stool incontinence Genito-Urinary ROS: as noted in HPI Musculoskeletal ROS: negative for - joint swelling or muscular weakness Neurological ROS: as noted in HPI Dermatological ROS: negative for rash and skin lesion changes  Physical Examination: Blood pressure 105/71, pulse 64, temperature 97.7 F (36.5 C), temperature source Oral, resp. rate 14, height 5' 11"  (1.803 m), weight 124.8 kg (275 lb 1.6 oz), SpO2 97 %.  HEENT-  Normocephalic, no lesions, without obvious abnormality.  Normal external eye and conjunctiva.  Normal TM's bilaterally.  Normal auditory canals and external ears. Normal external nose, mucus membranes and septum.  Normal pharynx. Cardiovascular- S1, S2 normal, pulses  palpable throughout   Lungs- chest clear, no wheezing, rales, normal symmetric air entry Abdomen- soft, non-tender; bowel sounds normal; no masses,  no organomegaly Extremities- LE edema Lymph-no adenopathy palpable Musculoskeletal-no joint tenderness, deformity or swelling Skin-warm and dry, no hyperpigmentation, vitiligo, or suspicious lesions  Neurological Examination   Mental Status: Alert, oriented, thought content appropriate.  Patient unable to count backward by 7's farther than 93.  Unable to make change for a dollar and needs multiple attempts to tell me how many nickels in a quarter.  Speech fluent without evidence of aphasia.  Able to follow 3 step commands without difficulty. Cranial Nerves: II: Discs flat bilaterally; Visual fields grossly normal, pupils equal, round, reactive to light and accommodation III,IV, VI: ptosis not present, extra-ocular motions intact bilaterally with nystagmus on right gaze V,VII: smile symmetric, facial light touch sensation normal bilaterally VIII: hearing normal bilaterally IX,X: gag reflex present XI: bilateral shoulder shrug XII: midline tongue extension Motor: Right : Upper extremity   5/5    Left:     Upper extremity   5/5  Lower extremity   5/5     Lower extremity   5/5 Tone and bulk:normal tone throughout; no atrophy noted Sensory: Pinprick and light touch intact throughout, bilaterally Deep Tendon Reflexes: 1+ and symmetric throughout Plantars: Right: upgoing   Left: upgoing Cerebellar: Normal finger-to-nose and normal heel-to-shin testing bilaterally Gait: not tested due to safety concerns    Laboratory Studies:   Basic Metabolic Panel:  Recent Labs Lab 09/06/16 1539 09/07/16 0102 09/07/16 0704 09/07/16 1256 09/08/16 0535  NA 138 137 137 138 138  K 2.8* 2.8* 2.8* 3.2* 2.9*  CL 93* 97* 98* 100* 100*  CO2 32 29 31 29 30   GLUCOSE 157* 149* 130* 187* 149*  BUN 14 13 12 13 15   CREATININE 0.98 0.90 0.95 1.07 1.08  CALCIUM  8.7* 8.3* 8.1* 8.1* 8.0*  MG  --  1.5*  --   --  1.6*  PHOS  --  3.5  --   --  3.1    Liver Function Tests:  Recent Labs Lab 09/06/16 1539  AST 25  ALT 15*  ALKPHOS 117  BILITOT 0.7  PROT 8.7*  ALBUMIN 3.9    Recent Labs Lab 09/06/16 1539  LIPASE 26   No results for input(s): AMMONIA in the last 168 hours.  CBC:  Recent Labs Lab 09/06/16 1539 09/07/16 0102 09/07/16 0704  WBC 13.0* 13.1* 11.9*  HGB 13.6 12.3* 11.9*  HCT 41.1 36.0* 34.4*  MCV 80.8 79.6* 79.5*  PLT 283 265 239    Cardiac Enzymes:  Recent Labs Lab 09/06/16 1539 09/06/16 2248 09/07/16 0102 09/07/16 0704 09/07/16 1256  TROPONINI 0.07* <0.03 <0.03 <0.03 <0.03    BNP: Invalid input(s): POCBNP  CBG:  Recent Labs Lab 09/07/16 0732 09/07/16 1219 09/07/16 1702 09/07/16 2145 09/08/16 0744  GLUCAP 126* 180* 234* 156* 129*    Microbiology: Results for orders placed or performed during the hospital encounter of 09/06/16  Urine culture     Status: None   Collection Time: 09/06/16  4:42 PM  Result Value Ref Range Status   Specimen Description URINE, CLEAN CATCH  Final   Special Requests Normal  Final   Culture   Final    NO GROWTH Performed at [facility], [place] [address], [place], [place] [address]    Report Status 09/08/2016 FINAL  Final    Coagulation Studies: No results for input(s): LABPROT, INR in the last 72 hours.  Urinalysis:  Recent Labs Lab 09/06/16 1642  COLORURINE YELLOW*  LABSPEC 1.021  PHURINE 5.0  GLUCOSEU NEGATIVE  HGBUR NEGATIVE  BILIRUBINUR NEGATIVE  KETONESUR 5*  PROTEINUR 30*  NITRITE NEGATIVE  LEUKOCYTESUR NEGATIVE    Lipid Panel:     Component Value Date/Time   CHOL 112 05/09/2016 0830   TRIG 100 05/09/2016 0830   HDL 35 (L) 05/09/2016 0830   CHOLHDL 3.2 11/23/2013 0200   VLDL 11 11/23/2013 0200   LDLCALC 57 05/09/2016 0830    HgbA1C:  Lab Results  Component Value Date   HGBA1C 7.5 (H) 09/07/2016    Urine Drug Screen:  No  results found for: LABOPIA, COCAINSCRNUR, LABBENZ, AMPHETMU, THCU, LABBARB  Alcohol Level: No results for input(s): ETH in the last 168 hours.  Other results: EKG: 81 bpm  Imaging: Dg Chest 2 View  Result Date: 09/08/2016 CLINICAL DATA:  Weakness EXAM: CHEST  2 VIEW COMPARISON:  12/06/2015 FINDINGS: Left-sided dual lead pacing device similar in position. No focal consolidation. Possible small amount of left pleural effusion or thickening. Moderate cardiomegaly without overt failure. No pneumothorax. IMPRESSION: 1. Cardiomegaly without overt failure. 2. Small amount of pleural fluid or thickening. No acute infiltrate. Electronically Signed   By: Donavan Foil M.D.   On: 09/08/2016 03:48   Ct Head Wo Contrast  Result Date: 09/07/2016 CLINICAL DATA:  Nausea, weakness and dizziness. EXAM: CT HEAD WITHOUT CONTRAST TECHNIQUE: Contiguous axial images were obtained from the base of the skull through the vertex without intravenous contrast. COMPARISON:  None. FINDINGS: Brain: Mild generalized atrophy. Ventricular prominence consistent with central atrophy. Normal pressure hydrocephalus not excluded but not specifically suggested. No evidence of focal brain insult, mass, hemorrhage or extra-axial collection. Vascular: There is atherosclerotic calcification of the major vessels at the base of the brain. Skull: Normal Sinuses/Orbits: Sinuses are clear except for an insignificant retention cyst in the right sphenoid sinus. No inflammatory changes. Orbits negative. Other: None significant IMPRESSION: No focal finding. Mild brain atrophy. Ventricular prominence felt secondary to central atrophy. The possibility of normal pressure hydrocephalus does exist, but is not strongly suggested. Electronically Signed   By: Jan Fireman.D.  On: 09/07/2016 12:57     Assessment/Plan: 66 year old male with episodes of nausea, vertigo and difficulty with gait.  Patient with a significant cardiac and atrial fibrillation history  . On Xarelto and ASA.  Head CT shows no acute changes.  There was question of NPH but history is not consistent and do not suspect this is the cause of patient's presentation.  Can not rule out posterior circulation TIA versus BPPV.  Patient unable to have MR imaging but on maximum therapy.  Will rule out need for posterior circulation intervention.       Recommendations: 1.  CTA of the head and neck 2.  EEG 3.  Full orthostatics 4.  Continue Xarelto and ASA 5.  Therapy  Alexis Goodell, MD Neurology [phone] 09/08/2016, 11:07 AM

## 2016-09-08 NOTE — Progress Notes (Addendum)
MEDICATION RELATED CONSULT NOTE - INITIAL   Pharmacy Consult for Electrolyte replacement Indication: Hypokalemia  Allergies  Allergen Reactions  . Iodine Other (See Comments)    Shortness of breath, swelling and hives  . Shrimp [Shellfish Allergy] Other (See Comments)    SWELLING    HIVES    SHORTNESS OF BREATH  . Tetracycline Rash    Patient Measurements: Height: 5' 11"  (180.3 cm) Weight: 275 lb 1.6 oz (124.8 kg) IBW/kg (Calculated) : 75.3   Vital Signs: Temp: 97.5 F (36.4 C) (02/08 1107) Temp Source: Oral (02/08 0818) BP: 107/69 (02/08 1107) Pulse Rate: 70 (02/08 1107) Intake/Output from previous day: 02/07 0701 - 02/08 0700 In: 600 [P.O.:600] Out: 2150 [Urine:2150] Intake/Output from this shift: Total I/O In: 240 [P.O.:240] Out: -   Labs:  Recent Labs  09/06/16 1539 09/07/16 0102 09/07/16 0704 09/07/16 1256 09/08/16 0535  WBC 13.0* 13.1* 11.9*  --   --   HGB 13.6 12.3* 11.9*  --   --   HCT 41.1 36.0* 34.4*  --   --   PLT 283 265 239  --   --   CREATININE 0.98 0.90 0.95 1.07 1.08  MG  --  1.5*  --   --  1.6*  PHOS  --  3.5  --   --  3.1  ALBUMIN 3.9  --   --   --   --   PROT 8.7*  --   --   --   --   AST 25  --   --   --   --   ALT 15*  --   --   --   --   ALKPHOS 117  --   --   --   --   BILITOT 0.7  --   --   --   --    Estimated Creatinine Clearance: 91.7 mL/min (by C-G formula based on SCr of 1.08 mg/dL).   Assessment: 66 yo male with hypokalemia and good renal function 2/7 received mag sulfate 2 g IV x1, KCl 30 mEq IV x1, KCl 40 mEq PO x2  2/8 K 2.9, Mag 1.6, Phos 3.1  Goal of Therapy:  K ~4 Mag ~2  Plan:  1) Add on Mag/Phos --> Mag 1.6 - will order mag sulfate 4 g IV x1 (did not improve much with 2 g yesterday) 2) KCl 30 mEq IV x1 3) KCl 40 mEq PO q6h x2 doses 4) recheck K at 1800  5) recheck BMET/Mag in AM  ADD:  K = 3.1. Will give an additional KCl 30 mEq IV x 1. Recheck in am.   2/9 AM K and Mg WNL. Recheck BMP tomorrow  AM.   Sim Boast, PharmD, BCPS  09/09/16

## 2016-09-08 NOTE — Progress Notes (Signed)
MEDICATION RELATED CONSULT NOTE - INITIAL   Pharmacy Consult for Electrolyte replacement Indication: Hypokalemia  Allergies  Allergen Reactions  . Iodine Other (See Comments)    Shortness of breath, swelling and hives  . Shrimp [Shellfish Allergy] Other (See Comments)    SWELLING    HIVES    SHORTNESS OF BREATH  . Tetracycline Rash    Patient Measurements: Height: 5' 11"  (180.3 cm) Weight: 275 lb 1.6 oz (124.8 kg) IBW/kg (Calculated) : 75.3   Vital Signs: Temp: 97.7 F (36.5 C) (02/08 0818) Temp Source: Oral (02/08 0818) BP: 105/71 (02/08 0818) Pulse Rate: 64 (02/08 0818) Intake/Output from previous day: 02/07 0701 - 02/08 0700 In: 600 [P.O.:600] Out: 2150 [Urine:2150] Intake/Output from this shift: No intake/output data recorded.  Labs:  Recent Labs  09/06/16 1539 09/07/16 0102 09/07/16 0704 09/07/16 1256 09/08/16 0535  WBC 13.0* 13.1* 11.9*  --   --   HGB 13.6 12.3* 11.9*  --   --   HCT 41.1 36.0* 34.4*  --   --   PLT 283 265 239  --   --   CREATININE 0.98 0.90 0.95 1.07 1.08  MG  --  1.5*  --   --  1.6*  PHOS  --  3.5  --   --  3.1  ALBUMIN 3.9  --   --   --   --   PROT 8.7*  --   --   --   --   AST 25  --   --   --   --   ALT 15*  --   --   --   --   ALKPHOS 117  --   --   --   --   BILITOT 0.7  --   --   --   --    Estimated Creatinine Clearance: 91.7 mL/min (by C-G formula based on SCr of 1.08 mg/dL).   Assessment: 66 yo male with hypokalemia and good renal function 2/7 received mag sulfate 2 g IV x1, KCl 30 mEq IV x1, KCl 40 mEq PO x2  2/8 K 2.9, Mag 1.6, Phos 3.1  Goal of Therapy:  K ~4 Mag ~2  Plan:  1) Add on Mag/Phos --> Mag 1.6 - will order mag sulfate 4 g IV x1 (did not improve much with 2 g yesterday) 2) KCl 30 mEq IV x1 3) KCl 40 mEq PO q6h x2 doses 4) recheck K at 1800 5) recheck BMET/Mag in AM  Rayna Sexton L 09/08/2016,10:22 AM

## 2016-09-08 NOTE — Progress Notes (Signed)
Chaplain did a follow up visit with the patient. Patient spend most of his day doing medical tests. He hopes to go home tomorrow. Pt. Is very charming and he enjoys telling stories about his family.

## 2016-09-08 NOTE — Care Management (Signed)
Patient "is not keen" on going to skilled nursing.  Might consider it if it was absolutely as last resort.  Would be agreeable going home with home health and "after thinking about it - I would even consider a walker."  He has no agency preference

## 2016-09-09 ENCOUNTER — Inpatient Hospital Stay: Payer: Medicare Other

## 2016-09-09 LAB — BASIC METABOLIC PANEL
Anion gap: 9 (ref 5–15)
BUN: 15 mg/dL (ref 6–20)
CHLORIDE: 100 mmol/L — AB (ref 101–111)
CO2: 28 mmol/L (ref 22–32)
Calcium: 8 mg/dL — ABNORMAL LOW (ref 8.9–10.3)
Creatinine, Ser: 0.97 mg/dL (ref 0.61–1.24)
GFR calc Af Amer: >60 mL/min (ref >60)
GFR calc non Af Amer: >60 mL/min (ref >60)
GLUCOSE: 209 mg/dL — AB (ref 65–99)
POTASSIUM: 3.7 mmol/L (ref 3.5–5.1)
Sodium: 137 mmol/L (ref 135–145)

## 2016-09-09 LAB — CBC
HEMATOCRIT: 36.2 % — AB (ref 40.0–52.0)
HEMOGLOBIN: 12.2 g/dL — AB (ref 13.0–18.0)
MCH: 27.1 pg (ref 26.0–34.0)
MCHC: 33.7 g/dL (ref 32.0–36.0)
MCV: 80.4 fL (ref 80.0–100.0)
Platelets: 238 10*3/uL (ref 150–440)
RBC: 4.5 MIL/uL (ref 4.40–5.90)
RDW: 17.6 % — ABNORMAL HIGH (ref 11.5–14.5)
WBC: 14.5 10*3/uL — ABNORMAL HIGH (ref 3.8–10.6)

## 2016-09-09 LAB — GLUCOSE, CAPILLARY
GLUCOSE-CAPILLARY: 281 mg/dL — AB (ref 65–99)
Glucose-Capillary: 201 mg/dL — ABNORMAL HIGH (ref 65–99)

## 2016-09-09 LAB — MAGNESIUM: MAGNESIUM: 1.9 mg/dL (ref 1.7–2.4)

## 2016-09-09 MED ORDER — IOPAMIDOL (ISOVUE-370) INJECTION 76%
75.0000 mL | Freq: Once | INTRAVENOUS | Status: AC | PRN
  Administered 2016-09-09: 75 mL via INTRAVENOUS

## 2016-09-09 NOTE — Care Management (Signed)
Physical therapy contines to recommend skilled nursing and patient is firmly intent on discharging home.  Notified [facility]- SN PT- vestibular Screen- OT Aide SW.  [facility] to provide the walker.  Family/friends will transport home

## 2016-09-09 NOTE — Care Management (Addendum)
Heads up referral to [facility] for SN PT OT and Aide in the event patient continues to decline snf.  May need a walker.  Await physical therapy recommendations

## 2016-09-09 NOTE — Progress Notes (Signed)
Physical Therapy Treatment Patient Details Name: Colton Lamb MRN: [id number] DOB: 07-26-1951 Today's Date: 09/09/2016    History of Present Illness presented to ER and admitted under observation secondary to emesis, urinary frequency, repeated dizziness.  CT head negative for acute change; CTA negative for large vessel occlusion.    PT Comments    Patient with persistent (but improved from previous session) nystagmus (worsened with gaze to R this date), dysconjugate eye movement and absent L eye convergence.  Patient moderately agitated with continued vestibular assessment, as he felt like continued testing would prevent his discharge home.  Educated on role of vestibular testing and possible treatment; patient refused at this time.  did encourage patient to follow up/discuss with HHPT upon discharge should symptoms persist.  Reinforced need/use of RW with all mobility (due to intermittent LE buckling, LOB) and reviewed value of pauses/accommodation to position with transition to upright. Patient adamantly refusing consideration of STR upon discharge.  Discussed with RNCM who plans to set up maximum home resources upon discharge, and will request vestibular screening/evaluation as appropriate from HHPT.   Follow Up Recommendations  SNF     Equipment Recommendations  Rolling walker with 5" wheels    Recommendations for Other Services       Precautions / Restrictions Precautions Precautions: Fall Restrictions Weight Bearing Restrictions: No    Mobility  Bed Mobility Overal bed mobility: Modified Independent Bed Mobility: Supine to Sit           General bed mobility comments: from flat surface without bedrails, transition towards R  Transfers Overall transfer level: Needs assistance Equipment used: Rolling walker (2 wheeled) Transfers: Sit to/from Stand Sit to Stand: Min guard         General transfer comment: good LE power, no  LOB  Ambulation/Gait Ambulation/Gait assistance: Min assist;Min guard Ambulation Distance (Feet): 150 Feet Assistive device: Rolling walker (2 wheeled)       General Gait Details: reciprocal stepping pattern with fair step height/length; mild R LE buckling at times, but patient able to self-correct.  LOB x1 with head turns during gait (generally ceasing gait to participate with any dynamic gait components), requiring min assist for fall prevention   Stairs            Wheelchair Mobility    Modified Rankin (Stroke Patients Only)       Balance Overall balance assessment: Needs assistance Sitting-balance support: No upper extremity supported;Feet supported Sitting balance-Leahy Scale: Good     Standing balance support: No upper extremity supported Standing balance-Leahy Scale: Fair                      Cognition Arousal/Alertness: Awake/alert Behavior During Therapy: WFL for tasks assessed/performed Overall Cognitive Status: Within Functional Limits for tasks assessed                 General Comments: mild difficulty with comprehension of higher-level information    Exercises Other Exercises Other Exercises: Continues with resting nystagmus, worsened with gaze to R.  L eye remains generally dysconjugate with absent convergence.  Denies dizziness with testing this date.  Imaging completed negative for central causes; however, unable to complete MRI due to presence of AICD.  Other Exercises: Patient moderately agitated with continued vestibular assessment, as he felt like continued testing would prevent his discharge home.  Educated on role of vestibular testing and possible treatment; patient refused at this time.  did encourage patient to follow up/discuss with HHPT upon discharge  should symptoms persist.  Reinforced need/use of RW with all mobility and reviewed value of pauses/accommodation to position with transition to upright.    General Comments         Pertinent Vitals/Pain Pain Assessment: No/denies pain    Home Living Family/patient expects to be discharged to:: Private residence Living Arrangements: Alone                  Prior Function            PT Goals (current goals can now be found in the care plan section) Acute Rehab PT Goals Patient Stated Goal: to get out of the hospital PT Goal Formulation: With patient Time For Goal Achievement: 09/21/16 Potential to Achieve Goals: Good Progress towards PT goals: Progressing toward goals    Frequency    Min 2X/week      PT Plan Current plan remains appropriate    Co-evaluation             End of Session Equipment Utilized During Treatment: Gait belt Activity Tolerance: Patient tolerated treatment well Patient left: in bed;with call bell/phone within reach;with bed alarm set     Time: 1436-1501 PT Time Calculation (min) (ACUTE ONLY): 25 min  Charges:  $Gait Training: 8-22 mins $Therapeutic Activity: 8-22 mins                    G Codes:       Thanos Cousineau H. Owens Shark, PT, DPT, NCS 09/09/16, 4:58 PM [phone]

## 2016-09-09 NOTE — Progress Notes (Signed)
Subjective: Patient reports no further dizziness today.  Able to stand and ambulate with the walker.    Objective: Current vital signs: BP 124/80 (BP Location: Left Arm)   Pulse 81   Temp 97.9 F (36.6 C) (Oral)   Resp 17   Ht 5' 11"  (1.803 m)   Wt 124.8 kg (275 lb 1.6 oz)   SpO2 91%   BMI 38.37 kg/m  Vital signs in last 24 hours: Temp:  [97.6 F (36.4 C)-98.9 F (37.2 C)] 97.9 F (36.6 C) (02/09 1201) Pulse Rate:  [72-81] 81 (02/09 1201) Resp:  [14-19] 17 (02/09 1201) BP: (107-146)/(62-91) 124/80 (02/09 1201) SpO2:  [91 %-97 %] 91 % (02/09 1201)  Intake/Output from previous day: 02/08 0701 - 02/09 0700 In: 968 [P.O.:720; IV Piggyback:248] Out: 2400 [Urine:2400] Intake/Output this shift: Total I/O In: 720 [P.O.:720] Out: 650 [Urine:650] Nutritional status: Diet heart healthy/carb modified Room service appropriate? Yes; Fluid consistency: Thin Diet - low sodium heart healthy  Neurologic Exam: Mental Status: Alert, oriented, thought content appropriate.  Speech fluent without evidence of aphasia.  Able to follow 3 step commands without difficulty. Cranial Nerves: II: Discs flat bilaterally; Visual fields grossly normal, pupils equal, round, reactive to light and accommodation III,IV, VI: ptosis not present, extra-ocular motions intact bilaterally with nystagmus on right and forward gaze V,VII: smile symmetric, facial light touch sensation normal bilaterally VIII: hearing normal bilaterally IX,X: gag reflex present XI: bilateral shoulder shrug XII: midline tongue extension Motor: Right :  Upper extremity   5/5                                      Left:     Upper extremity   5/5             Lower extremity   5/5                                                  Lower extremity   5/5  Lab Results: Basic Metabolic Panel:  Recent Labs Lab 09/07/16 0102 09/07/16 0704 09/07/16 1256 09/08/16 0535 09/08/16 1851 09/09/16 0329  NA 137 137 138 138  --  137  K 2.8* 2.8*  3.2* 2.9* 3.1* 3.7  CL 97* 98* 100* 100*  --  100*  CO2 29 31 29 30   --  28  GLUCOSE 149* 130* 187* 149*  --  209*  BUN 13 12 13 15   --  15  CREATININE 0.90 0.95 1.07 1.08  --  0.97  CALCIUM 8.3* 8.1* 8.1* 8.0*  --  8.0*  MG 1.5*  --   --  1.6*  --  1.9  PHOS 3.5  --   --  3.1  --   --     Liver Function Tests:  Recent Labs Lab 09/06/16 1539  AST 25  ALT 15*  ALKPHOS 117  BILITOT 0.7  PROT 8.7*  ALBUMIN 3.9    Recent Labs Lab 09/06/16 1539  LIPASE 26   No results for input(s): AMMONIA in the last 168 hours.  CBC:  Recent Labs Lab 09/06/16 1539 09/07/16 0102 09/07/16 0704 09/09/16 0329  WBC 13.0* 13.1* 11.9* 14.5*  HGB 13.6 12.3* 11.9* 12.2*  HCT 41.1 36.0* 34.4* 36.2*  MCV 80.8 79.6* 79.5*  80.4  PLT 283 265 239 238    Cardiac Enzymes:  Recent Labs Lab 09/06/16 1539 09/06/16 2248 09/07/16 0102 09/07/16 0704 09/07/16 1256  TROPONINI 0.07* <0.03 <0.03 <0.03 <0.03    Lipid Panel: No results for input(s): CHOL, TRIG, HDL, CHOLHDL, VLDL, LDLCALC in the last 168 hours.  CBG:  Recent Labs Lab 09/08/16 1151 09/08/16 1627 09/08/16 2140 09/09/16 0749 09/09/16 1158  GLUCAP 175* 159* 191* 201* 2*    Microbiology: Results for orders placed or performed during the hospital encounter of 09/06/16  Urine culture     Status: None   Collection Time: 09/06/16  4:42 PM  Result Value Ref Range Status   Specimen Description URINE, CLEAN CATCH  Final   Special Requests Normal  Final   Culture   Final    NO GROWTH Performed at [facility], [place] [address], [place], [place] [address]    Report Status 09/08/2016 FINAL  Final    Coagulation Studies: No results for input(s): LABPROT, INR in the last 72 hours.  Imaging: Ct Angio Head W Or Wo Contrast  Result Date: 09/09/2016 CLINICAL DATA:  66 year old male with dizziness symptoms beginning 2 weeks ago. Initial encounter. EXAM: CT ANGIOGRAPHY HEAD AND NECK TECHNIQUE: Multidetector CT imaging of the  head and neck was performed using the standard protocol during bolus administration of intravenous contrast. Multiplanar CT image reconstructions and MIPs were obtained to evaluate the vascular anatomy. Carotid stenosis measurements (when applicable) are obtained utilizing NASCET criteria, using the distal internal carotid diameter as the denominator. CONTRAST:  75 mL Isovue 370 COMPARISON:  Head CT without contrast 09/07/2016. FINDINGS: CT HEAD Brain: Mild ventriculomegaly again noted, although the temporal horns remain fairly diminutive. No midline shift, mass effect, or evidence of intracranial mass lesion. No acute intracranial hemorrhage identified. No cortically based acute infarct identified. Normal for age gray-white matter differentiation. Calvarium and skull base: Stable and intact. Paranasal sinuses: Visualized paranasal sinuses and mastoids are stable and well pneumatized. Orbits: No acute orbit or scalp soft tissue findings. CTA NECK Skeleton: Carious dentition. Flowing osteophytes in the cervical and thoracic spine suggesting diffuse idiopathic skeletal hyperostosis. No acute osseous abnormality identified. Upper chest: Left chest cardiac pacemaker. Negative lung apices. Mediastinal lipomatosis. No superior mediastinal lymphadenopathy. Other neck: Negative thyroid, larynx, pharynx, parapharyngeal spaces, retropharyngeal space, sublingual space, submandibular glands and parotid glands. No cervical lymphadenopathy. Aortic arch: 3 vessel arch configuration. Mild mostly distal arch and proximal descending aortic calcified plaque. Right carotid system: Negative right CCA. Minimal soft and calcified plaque at the right carotid bifurcation with no stenosis. Minimal calcified plaque in the distal right ICA just below the skullbase without stenosis. Left carotid system: Minimal calcified plaque at the anterior left CCA origin without stenosis. Mild soft and calcified plaque at the left carotid bifurcation  without stenosis. Vertebral arteries:No proximal right subclavian artery stenosis despite calcified plaque. The right vertebral artery origin is not well visualized. There could be moderate to severe origin stenosis. The right vertebral is non dominant but remains patent to the skullbase. No proximal left subclavian artery stenosis. Normal left vertebral artery origin. Dominant left vertebral with mildly tortuous V1 segment. No left vertebral artery stenosis to the skullbase. CTA HEAD Posterior circulation: Dominant distal left vertebral artery with no stenosis primarily supplies the basilar. Left PICA origin is patent. Diminutive distal right vertebral artery is moderately to severely stenotic just before the vertebrobasilar junction. No basilar stenosis. Normal SCA and PCA origins. Posterior communicating arteries are diminutive  or absent. Bilateral PCA branches are within normal limits. Anterior circulation: Both ICA siphons are patent. Minimal bilateral siphon calcified plaque. No siphon stenosis. Normal ophthalmic artery origins. Normal carotid termini, MCA and ACA origins. Diminutive anterior communicating artery. Bilateral ACA branches are within normal limits. Left MCA M1 segment, bifurcation, and left MCA branches are within normal limits. Right MCA M1 segment, bifurcation, and right MCA branches are within normal limits. Venous sinuses: Patent. Anatomic variants: Dominant left and diminutive right vertebral arteries. Delayed phase: No abnormal enhancement identified. Review of the MIP images confirms the above findings IMPRESSION: 1. Negative for emergent large vessel occlusion. Minimal carotid atherosclerosis with no carotid or anterior circulation stenosis. 2. Dominant left vertebral artery without stenosis supplies the basilar. Non dominant right vertebral artery is diminutive and may be stenotic both at its origin and just proximal to the vertebrobasilar junction. 3. Otherwise negative posterior  circulation. 4.  Stable CT appearance of the brain since 09/07/2016. 5. Carious dentition.  Diffuse idiopathic skeletal hyperostosis. Electronically Signed   By: Genevie Ann M.D.   On: 09/09/2016 09:53   Dg Chest 2 View  Result Date: 09/08/2016 CLINICAL DATA:  Weakness EXAM: CHEST  2 VIEW COMPARISON:  12/06/2015 FINDINGS: Left-sided dual lead pacing device similar in position. No focal consolidation. Possible small amount of left pleural effusion or thickening. Moderate cardiomegaly without overt failure. No pneumothorax. IMPRESSION: 1. Cardiomegaly without overt failure. 2. Small amount of pleural fluid or thickening. No acute infiltrate. Electronically Signed   By: Donavan Foil M.D.   On: 09/08/2016 03:48   Ct Angio Neck W Or Wo Contrast  Result Date: 09/09/2016 CLINICAL DATA:  66 year old male with dizziness symptoms beginning 2 weeks ago. Initial encounter. EXAM: CT ANGIOGRAPHY HEAD AND NECK TECHNIQUE: Multidetector CT imaging of the head and neck was performed using the standard protocol during bolus administration of intravenous contrast. Multiplanar CT image reconstructions and MIPs were obtained to evaluate the vascular anatomy. Carotid stenosis measurements (when applicable) are obtained utilizing NASCET criteria, using the distal internal carotid diameter as the denominator. CONTRAST:  75 mL Isovue 370 COMPARISON:  Head CT without contrast 09/07/2016. FINDINGS: CT HEAD Brain: Mild ventriculomegaly again noted, although the temporal horns remain fairly diminutive. No midline shift, mass effect, or evidence of intracranial mass lesion. No acute intracranial hemorrhage identified. No cortically based acute infarct identified. Normal for age gray-white matter differentiation. Calvarium and skull base: Stable and intact. Paranasal sinuses: Visualized paranasal sinuses and mastoids are stable and well pneumatized. Orbits: No acute orbit or scalp soft tissue findings. CTA NECK Skeleton: Carious dentition.  Flowing osteophytes in the cervical and thoracic spine suggesting diffuse idiopathic skeletal hyperostosis. No acute osseous abnormality identified. Upper chest: Left chest cardiac pacemaker. Negative lung apices. Mediastinal lipomatosis. No superior mediastinal lymphadenopathy. Other neck: Negative thyroid, larynx, pharynx, parapharyngeal spaces, retropharyngeal space, sublingual space, submandibular glands and parotid glands. No cervical lymphadenopathy. Aortic arch: 3 vessel arch configuration. Mild mostly distal arch and proximal descending aortic calcified plaque. Right carotid system: Negative right CCA. Minimal soft and calcified plaque at the right carotid bifurcation with no stenosis. Minimal calcified plaque in the distal right ICA just below the skullbase without stenosis. Left carotid system: Minimal calcified plaque at the anterior left CCA origin without stenosis. Mild soft and calcified plaque at the left carotid bifurcation without stenosis. Vertebral arteries:No proximal right subclavian artery stenosis despite calcified plaque. The right vertebral artery origin is not well visualized. There could be moderate to severe origin stenosis. The  right vertebral is non dominant but remains patent to the skullbase. No proximal left subclavian artery stenosis. Normal left vertebral artery origin. Dominant left vertebral with mildly tortuous V1 segment. No left vertebral artery stenosis to the skullbase. CTA HEAD Posterior circulation: Dominant distal left vertebral artery with no stenosis primarily supplies the basilar. Left PICA origin is patent. Diminutive distal right vertebral artery is moderately to severely stenotic just before the vertebrobasilar junction. No basilar stenosis. Normal SCA and PCA origins. Posterior communicating arteries are diminutive or absent. Bilateral PCA branches are within normal limits. Anterior circulation: Both ICA siphons are patent. Minimal bilateral siphon calcified  plaque. No siphon stenosis. Normal ophthalmic artery origins. Normal carotid termini, MCA and ACA origins. Diminutive anterior communicating artery. Bilateral ACA branches are within normal limits. Left MCA M1 segment, bifurcation, and left MCA branches are within normal limits. Right MCA M1 segment, bifurcation, and right MCA branches are within normal limits. Venous sinuses: Patent. Anatomic variants: Dominant left and diminutive right vertebral arteries. Delayed phase: No abnormal enhancement identified. Review of the MIP images confirms the above findings IMPRESSION: 1. Negative for emergent large vessel occlusion. Minimal carotid atherosclerosis with no carotid or anterior circulation stenosis. 2. Dominant left vertebral artery without stenosis supplies the basilar. Non dominant right vertebral artery is diminutive and may be stenotic both at its origin and just proximal to the vertebrobasilar junction. 3. Otherwise negative posterior circulation. 4.  Stable CT appearance of the brain since 09/07/2016. 5. Carious dentition.  Diffuse idiopathic skeletal hyperostosis. Electronically Signed   By: Genevie Ann M.D.   On: 09/09/2016 09:53    Medications:  I have reviewed the patient's current medications. Scheduled: . amiodarone  200 mg Oral Daily  . aspirin  81 mg Oral Daily  . atorvastatin  80 mg Oral q1800  . balsalazide  750 mg Oral TID  . carvedilol  12.5 mg Oral BID WC  . insulin aspart  0-20 Units Subcutaneous TID WC  . insulin aspart  0-5 Units Subcutaneous QHS  . niacin  1,000 mg Oral QHS  . pantoprazole  40 mg Oral Daily  . rivaroxaban  20 mg Oral Daily  . sacubitril-valsartan  1 tablet Oral BID  . sodium chloride flush  3 mL Intravenous Q12H  . torsemide  40 mg Oral BID    Assessment/Plan: Symptoms improved.  Patient not orthostatic.  EEG only significant for slowing.  CTA of the head and neck show no evidence of LVO.  Patient on ASA and Rivaroxaban making stroke less likely but unable to  perform MRI and patient asymptomatic at this time and on maximum therapy wit no indication for intervention.   A1c 7.5.  Last LDL 57 in October 2017.    Recommendations: 1.  Continue ASA and Xarelto 2.  Therapy     LOS: 1 day   Alexis Goodell, MD Neurology [phone] 09/09/2016  3:47 PM

## 2016-09-09 NOTE — Care Management Important Message (Signed)
Important Message  Patient Details  Name: OLNEY MONIER MRN: [id number] Date of Birth: 07-21-1951   Medicare Important Message Given:  Yes  Initial signed IM printed from Epic and given to patient.    Katrina Stack, RN 09/09/2016, 8:14 AM

## 2016-09-09 NOTE — NC FL2 (Signed)
[place] LEVEL OF CARE SCREENING TOOL     IDENTIFICATION  Patient Name: Colton Lamb Birthdate: [birth year] Sex: male Admission Date (Current Location): 09/06/2016  [place] and [place] Number:  Engineering geologist and Address:  [facility], [address] [place], [place] [address]      Provider Number: [id number]  Attending Physician Name and Address:  Max Sane, MD  Relative Name and Phone Number:     Herschel Senegal   [phone]   Dorethea Clan [phone]      Current Level of Care: Hospital Recommended Level of Care: [place] Prior Approval Number:    Date Approved/Denied:   PASRR Number: [id number]  Discharge Plan: SNF    Current Diagnoses: Patient Active Problem List   Diagnosis Date Noted  . Hydrocephalus 09/08/2016  . Dizziness and giddiness   . Elevated troponin 09/06/2016  . Type II diabetes mellitus ([place])   . Ischemic cardiomyopathy   . Hyponatremia 07/01/2015  . Diabetes mellitus type 2, uncontrolled, with complications ([place]) 48/54/6270  . 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 OSA (obstructive sleep apnea) 12/20/2013  . Morbid obesity ([place]) 12/20/2013  . AKI (acute kidney injury) - Creatinine improved at d/c 12/14/2013  . Elevated TSH - will need f/u TFTs with PCP in 3-4 weeks 12/14/2013  . Junctional bradycardia - resolved 12/14/2013  . Syncope - due to bradycardia in setting of Digoxin Toxicity 12/11/2013  . Chronic systolic heart failure ([place]) 12/06/2013  . At risk for sudden cardiac death - on LifeVest 2013/12/21  . Acute combined systolic and diastolic CHF, NYHA class 3 -- 2/2 MI 2013/12/21  . Cardiomyopathy, ischemic Dec 21, 2013  . Atrial flutter ([place]) 11/22/2013  . NSTEMI (non-ST elevated myocardial infarction) ([place]) 11/22/2013  . Long term current use of anticoagulant 10/19/2010  . Hyperlipidemia 05/06/2010  . Edema 05/06/2010  . CAD S/P  percutaneous coronary angioplasty - multiple PCIs 11/11/2008  . Atrial fibrillation ([place]) 11/11/2008  . GERD 11/11/2008  . [place] INTESTINE 11/11/2008  . COLONIC POLYPS, HX OF 11/11/2008  . SHELLFISH ALLERGY 11/11/2008    Orientation RESPIRATION BLADDER Height & Weight     Self, Time, Situation, Place  Normal Continent Weight: 275 lb 1.6 oz (124.8 kg) Height:  5' 11"  (180.3 cm)  BEHAVIORAL SYMPTOMS/MOOD NEUROLOGICAL BOWEL NUTRITION STATUS      Continent Diet (Carb Modified)  AMBULATORY STATUS COMMUNICATION OF NEEDS Skin   Limited Assist Verbally Normal                       Personal Care Assistance Level of Assistance  Bathing, Feeding, Dressing Bathing Assistance: Limited assistance Feeding assistance: Independent Dressing Assistance: Limited assistance     Functional Limitations Info  Sight, Hearing, Speech Sight Info: Adequate Hearing Info: Adequate Speech Info: Adequate    SPECIAL CARE FACTORS FREQUENCY  PT (By licensed PT)     PT Frequency: minimum 2x a week              Contractures Contractures Info: Not present    Additional Factors Info  Code Status, Allergies, Insulin Sliding Scale Code Status Info: Full Code Allergies Info: IODINE, SHRIMP SHELLFISH ALLERGY, TETRACYCLINE   Insulin Sliding Scale Info: insulin aspart (novoLOG) injection 0-20 Units 3x a day with meals       Current Medications (09/09/2016):  This is the current hospital active medication list Current Facility-Administered Medications  Medication Dose Route Frequency Provider Last Rate  Last Dose  . acetaminophen (TYLENOL) tablet 650 mg  650 mg Oral Q6H PRN Alexis Hugelmeyer, DO       Or  . acetaminophen (TYLENOL) suppository 650 mg  650 mg Rectal Q6H PRN Alexis Hugelmeyer, DO      . albuterol (PROVENTIL) (2.5 MG/3ML) 0.083% nebulizer solution 2.5 mg  2.5 mg Nebulization Q6H PRN Alexis Hugelmeyer, DO      . amiodarone (PACERONE) tablet 200 mg  200 mg Oral  Daily Alexis Hugelmeyer, DO   200 mg at 09/09/16 0843  . aspirin chewable tablet 81 mg  81 mg Oral Daily Alexis Hugelmeyer, DO   81 mg at 09/09/16 0842  . atorvastatin (LIPITOR) tablet 80 mg  80 mg Oral q1800 Alexis Hugelmeyer, DO   80 mg at 09/08/16 1855  . balsalazide (COLAZAL) capsule 750 mg  750 mg Oral TID Alexis Hugelmeyer, DO   750 mg at 09/09/16 6378  . bisacodyl (DULCOLAX) EC tablet 5 mg  5 mg Oral Daily PRN Alexis Hugelmeyer, DO      . carvedilol (COREG) tablet 12.5 mg  12.5 mg Oral BID WC Alexis Hugelmeyer, DO   12.5 mg at 09/09/16 5885  . insulin aspart (novoLOG) injection 0-20 Units  0-20 Units Subcutaneous TID WC Alexis Hugelmeyer, DO   11 Units at 09/09/16 1222  . insulin aspart (novoLOG) injection 0-5 Units  0-5 Units Subcutaneous QHS Alexis Hugelmeyer, DO      . ipratropium (ATROVENT) nebulizer solution 0.5 mg  0.5 mg Nebulization Q6H PRN Alexis Hugelmeyer, DO      . magnesium citrate solution 1 Bottle  1 Bottle Oral Once PRN Alexis Hugelmeyer, DO      . niacin CR capsule 1,000 mg  1,000 mg Oral QHS Alexis Hugelmeyer, DO   1,000 mg at 09/08/16 2145  . nitroGLYCERIN (NITROSTAT) SL tablet 0.4 mg  0.4 mg Sublingual Q5 min PRN Alexis Hugelmeyer, DO      . ondansetron (ZOFRAN) tablet 4 mg  4 mg Oral Q6H PRN Alexis Hugelmeyer, DO       Or  . ondansetron (ZOFRAN) injection 4 mg  4 mg Intravenous Q6H PRN Alexis Hugelmeyer, DO   4 mg at 09/07/16 0117  . oxyCODONE (Oxy IR/ROXICODONE) immediate release tablet 5 mg  5 mg Oral Q4H PRN Alexis Hugelmeyer, DO      . pantoprazole (PROTONIX) EC tablet 40 mg  40 mg Oral Daily Alexis Hugelmeyer, DO   40 mg at 09/09/16 0842  . rivaroxaban (XARELTO) tablet 20 mg  20 mg Oral Daily Alexis Hugelmeyer, DO   20 mg at 09/09/16 0842  . sacubitril-valsartan (ENTRESTO) 49-51 mg per tablet  1 tablet Oral BID Alexis Hugelmeyer, DO   1 tablet at 09/09/16 0277  . senna-docusate (Senokot-S) tablet 1 tablet  1 tablet Oral QHS PRN Alexis Hugelmeyer, DO      . sodium  chloride flush (NS) 0.9 % injection 3 mL  3 mL Intravenous Q12H Alexis Hugelmeyer, DO   3 mL at 09/09/16 1048  . torsemide (DEMADEX) tablet 40 mg  40 mg Oral BID Alexis Hugelmeyer, DO   40 mg at 09/09/16 4128     Discharge Medications: Please see discharge summary for a list of discharge medications.  Relevant Imaging Results:  Relevant Lab Results:   Additional Information SSN [id number]  Ross Ludwig, [place]

## 2016-09-09 NOTE — Clinical Social Work Note (Signed)
Clinical Social Work Assessment  Patient Details  Name: Colton Lamb MRN: [id number] Date of Birth: [birth year]  Date of referral:  09/09/16               Reason for consult:  Facility Placement                Permission sought to share information with:  Facility Sport and exercise psychologist, Family Supports Permission granted to share information::  Yes, Verbal Permission Granted  Name::     Herschel Senegal   [phone] or Dorethea Clan [phone]   Agency::  SNF admissions  Relationship::     Contact Information:     Housing/Transportation Living arrangements for the past 2 months:  Mobile Home Source of Information:  Patient Patient Interpreter Needed:  None Criminal Activity/Legal Involvement Pertinent to Current Situation/Hospitalization:  No - Comment as needed Significant Relationships:  Adult Children, Other Family Members Lives with:  Self Do you feel safe going back to the place where you live?  Yes Need for family participation in patient care:  No (Coment)  Care giving concerns: Patient expressed he feels confident about returning back home with home health.    Social Worker assessment / plan:  Patient is a 66 year old male who lives alone, patient is alert and oriented x4 and able to express his feelings.  Patient states he has been to rehab, in the past, but would rather go home with home health this time.  Patient states he feels much better then he did when he was admitted earlier in the week.  Patient expresses that he is confident that once he gets home, he will do fine with a walker and having home health see him.  Patient was very talkative and explained what had happened which brought him into the hospital.  Patient expressed that he did not have any questions, and he also said if he feels like he needs more help at home, he will consider SNF placement then.  Employment status:  Retired Forensic scientist:  Medicare PT Recommendations:  [place] / Referral to community resources:     Patient/Family's Response to care:  Patient would rather go home with home health instead of going to SNF for rehab. Patient/Family's Understanding of and Emotional Response to Diagnosis, Current Treatment, and Prognosis:  Patient is aware of current treatment plan and prognosis.  Emotional Assessment Appearance:  Appears stated age Attitude/Demeanor/Rapport:    Affect (typically observed):  Accepting, Appropriate, Calm, Stable, Pleasant Orientation:  Oriented to Self, Oriented to Place, Oriented to  Time, Oriented to Situation Alcohol / Substance use:  Not Applicable Psych involvement (Current and /or in the community):  No (Comment)  Discharge Needs  Concerns to be addressed:  Lack of Support Readmission within the last 30 days:  No Current discharge risk:  Lack of support system, Lives alone Barriers to Discharge:  No Barriers Identified   Ross Ludwig, LCSWA 09/09/2016, 10:10 AM

## 2016-09-09 NOTE — Plan of Care (Signed)
Problem: Physical Regulation: Goal: Ability to maintain clinical measurements within normal limits will improve Outcome: Completed/Met Date Met: 09/09/16 Discharged with Home Health: RN, PT, aide

## 2016-09-09 NOTE — Clinical Social Work Note (Signed)
CSW met with patient and he would rather go home with home health verse going to SNF for short term rehab.  CSW explained to patient differences of going to SNF verse going to home health.  Patient expressed he would rather save his Medicare days for when he may need more therapy.  Patient expressed that he feels well today and is hopeful he can go home soon.  Case manager made aware of patient wanting to go home with home health.  Jones Broom. [place], MSW, [place]  09/09/2016 11:20 AM

## 2016-09-09 NOTE — Plan of Care (Signed)
Problem: Education: Goal: Knowledge of [facility] Education information/materials will improve Outcome: Adequate for Discharge Discharge instructions given, regarding physical limitations, discharge meds: indications and potential side effects.

## 2016-09-09 NOTE — Discharge Instructions (Signed)
Please seek medical attention for any high fevers, chest pain, shortness of breath, change in behavior, persistent vomiting, bloody stool or any other new or concerning symptoms.    Dizziness Dizziness is a common problem. It makes you feel unsteady or lightheaded. You may feel like you are about to pass out (faint). Dizziness can lead to injury if you stumble or fall. Anyone can get dizzy, but dizziness is more common in older adults. This condition can be caused by a number of things, including:  Medicines.  Dehydration.  Illness. Follow these instructions at home: Following these instructions may help with your condition: Eating and drinking  Drink enough fluid to keep your pee (urine) clear or pale yellow. This helps to keep you from getting dehydrated. Try to drink more clear fluids, such as water.  Do not drink alcohol.  Limit how much caffeine you drink or eat if told by your doctor.  Limit how much salt you drink or eat if told by your doctor. Activity  Avoid making quick movements.  When you stand up from sitting in a chair, steady yourself until you feel okay.  In the morning, first sit up on the side of the bed. When you feel okay, stand slowly while you hold onto something. Do this until you know that your balance is fine.  Move your legs often if you need to stand in one place for a long time. Tighten and relax your muscles in your legs while you are standing.  Do not drive or use heavy machinery if you feel dizzy.  Avoid bending down if you feel dizzy. Place items in your home so that they are easy for you to reach without leaning over. Lifestyle  Do not use any tobacco products, including cigarettes, chewing tobacco, or electronic cigarettes. If you need help quitting, ask your doctor.  Try to lower your stress level, such as with yoga or meditation. Talk with your doctor if you need help. General instructions  Watch your dizziness for any changes.  Take  medicines only as told by your doctor. Talk with your doctor if you think that your dizziness is caused by a medicine that you are taking.  Tell a friend or a family member that you are feeling dizzy. If he or she notices any changes in your behavior, have this person call your doctor.  Keep all follow-up visits as told by your doctor. This is important. Contact a doctor if:  Your dizziness does not go away.  Your dizziness or light-headedness gets worse.  You feel sick to your stomach (nauseous).  You have trouble hearing.  You have new symptoms.  You are unsteady on your feet or you feel like the room is spinning. Get help right away if:  You throw up (vomit) or have diarrhea and are unable to eat or drink anything.  You have trouble:  Talking.  Walking.  Swallowing.  Using your arms, hands, or legs.  You feel generally weak.  You are not thinking clearly or you have trouble forming sentences. It may take a friend or family member to notice this.  You have:  Chest pain.  Pain in your belly (abdomen).  Shortness of breath.  Sweating.  Your vision changes.  You are bleeding.  You have a headache.  You have neck pain or a stiff neck.  You have a fever. This information is not intended to replace advice given to you by your health care provider. Make sure you discuss  any questions you have with your health care provider. Document Released: 07/07/2011 Document Revised: 12/24/2015 Document Reviewed: 07/14/2014 Elsevier Interactive Patient Education  2017 [employer].

## 2016-09-10 NOTE — Discharge Summary (Signed)
[facility] [place] NAME: Colton Lamb    MR#:  [id number]  DATE OF BIRTH:  Apr 28, 1951  DATE OF ADMISSION:  09/06/2016   ADMITTING PHYSICIAN: Harvie Bridge, DO  DATE OF DISCHARGE: 09/09/2016  4:55 PM  PRIMARY CARE PHYSICIAN: Sherrin Daisy, MD   ADMISSION DIAGNOSIS:  Elevated troponin [R74.8] Nausea and vomiting, intractability of vomiting not specified, unspecified vomiting type [R11.2] DISCHARGE DIAGNOSIS:  Active Problems:   Elevated troponin   Hydrocephalus   Dizziness and giddiness  SECONDARY DIAGNOSIS:   Past Medical History:  Diagnosis Date  . Atrial flutter ([place])    a. s/p Cardioversion 11/22/13, on amiodarone and Xarelto.  . CHF (congestive heart failure) ([place])   . Chronic systolic heart failure ([place])    a. 10/2013 EF 20-25%, grade III DD, RV mildly dilated and sys fx mild/mod reduced;  b. 01/2014 Echo: EF 30-35%, gr3 DD, mod dil LA.  Marland Kitchen Coronary artery disease    a. s/p MI 2007/2015;  b. s/p prior PCI to the LAD/LCX/PDA/PL;  c. 2008: s/p Cypher DES to the OM.  Marland Kitchen Crohn's ileocolitis ([place])   . GERD (gastroesophageal reflux disease)   . Hx of adenomatous colonic polyps 11/2003  . Hyperlipidemia   . Hypertension   . Ischemic cardiomyopathy    s. 01/2014 s/p MDT [id number] [name] XT DR single lead AICD.  Marland Kitchen Obesity   . Paroxysmal atrial fibrillation (HCC)    a. CHA2DS2VASc = 4-->xarelto/amio.  . Sleep apnea   . Syncope    a.  11/2013 in setting of volume depletion and bradycardia due to dig toxicity   . Type II diabetes mellitus [facility])    HOSPITAL COURSE:  * Dizziness   MRI brain cannot be done because of presence of AICD - Continue Xarelto and ASA.  - Patient not orthostatic.  EEG only significant for slowing.  CTA of the head and neck show no evidence of LVO.  Patient on ASA and Rivaroxaban making stroke less likely but unable to perform MRI and patient asymptomatic at this time and on maximum therapy wit no indication  for intervention.   A1c 7.5.  Last LDL 57 in October 2017.    * Elevated troponin  due to supply demand ischemia  * Hypokalemia, hypomagnesemia.   Repleted  * Elevated white blood cell count. Patient also had nausea. Now resolved   source of infection, urinalysis is negative.  chest x-ray shows no acute patho  * History of atrial fibrillation   Continue amiodarone, Coreg, xarelto.  * History of coronary artery disease, chronic systolic CHF status post AICD.   Continue Coreg, torsemide, enteresto, Lipitor, aspirin.  * Hypertension   Continue Coreg, enteresto  DISCHARGE CONDITIONS:  stable CONSULTS OBTAINED:  Treatment Team:  Alexis Goodell, MD DRUG ALLERGIES:   Allergies  Allergen Reactions  . Iodine Other (See Comments)    Shortness of breath, swelling and hives  . Shrimp [Shellfish Allergy] Other (See Comments)    SWELLING    HIVES    SHORTNESS OF BREATH  . Tetracycline Rash   DISCHARGE MEDICATIONS:   Allergies as of 09/09/2016      Reactions   Iodine Other (See Comments)   Shortness of breath, swelling and hives   Shrimp [shellfish Allergy] Other (See Comments)   SWELLING    HIVES    SHORTNESS OF BREATH   Tetracycline Rash      Medication List    TAKE these medications  acetaminophen 325 MG tablet Commonly known as:  TYLENOL Take 1-2 tablets (325-650 mg total) by mouth every 4 (four) hours as needed for mild pain.   amiodarone 200 MG tablet Commonly known as:  PACERONE TAKE 1 TABLET BY MOUTH MON.-FRIDAY AND 1/2 TABLET BY MOUTH SATURDAY AND SUNDAY.   aspirin 81 MG tablet Take 81 mg by mouth daily.   atorvastatin 80 MG tablet Commonly known as:  LIPITOR TAKE 1 TABLET (80 MG TOTAL) BY MOUTH DAILY.   balsalazide 750 MG capsule Commonly known as:  COLAZAL TAKE 1 CAPSULE (750 MG TOTAL) BY MOUTH 3 (THREE) TIMES DAILY.   carvedilol 12.5 MG tablet Commonly known as:  COREG TAKE 1 TABLET (12.5 MG TOTAL) BY MOUTH 2 (TWO) TIMES DAILY.   ENTRESTO  49-51 MG Generic drug:  sacubitril-valsartan TAKE 1 TABLET BY MOUTH 2 (TWO) TIMES DAILY.   metFORMIN 1000 MG tablet Commonly known as:  GLUCOPHAGE Take 1,000 mg by mouth 2 (two) times daily with a meal. Notes to patient:  Sunday EVENING.  NOT TO TAKE IT FOR 48 HOURS AFTER THE CT DYE   niacin 1000 MG CR tablet Commonly known as:  NIASPAN TAKE 1 TABLET BY MOUTH AT BEDTIME   nitroGLYCERIN 0.4 MG SL tablet Commonly known as:  NITROSTAT Place 0.4 mg under the tongue every 5 (five) minutes as needed (MAX 3 TABLETS). FOR CHEST PAIN   omeprazole 20 MG capsule Commonly known as:  PRILOSEC Take 20 mg by mouth daily.   ondansetron 4 MG tablet Commonly known as:  ZOFRAN Take 1 tablet (4 mg total) by mouth every 8 (eight) hours as needed for nausea or vomiting.   torsemide 20 MG tablet Commonly known as:  DEMADEX TAKE 2 TABLETS (40 MG TOTAL) BY MOUTH 2 (TWO) TIMES DAILY.   XARELTO 20 MG Tabs tablet Generic drug:  rivaroxaban TAKE 1 TABLET BY MOUTH EVERY DAY WITH LUNCH        DISCHARGE INSTRUCTIONS:   DIET:  Regular diet DISCHARGE CONDITION:  Good ACTIVITY:  Activity as tolerated OXYGEN:  Home Oxygen: No.  Oxygen Delivery: room air DISCHARGE LOCATION:  home with home health, PT, vestibular screen, OT, please note pt was recommended to go to STR/SNF but he refused and chose to go home  If you experience worsening of your admission symptoms, develop shortness of breath, life threatening emergency, suicidal or homicidal thoughts you must seek medical attention immediately by calling 911 or calling your MD immediately  if symptoms less severe.  You Must read complete instructions/literature along with all the possible adverse reactions/side effects for all the Medicines you take and that have been prescribed to you. Take any new Medicines after you have completely understood and accpet all the possible adverse reactions/side effects.   Please note  You were cared for by a  hospitalist during your hospital stay. If you have any questions about your discharge medications or the care you received while you were in the hospital after you are discharged, you can call the unit and asked to speak with the hospitalist on call if the hospitalist that took care of you is not available. Once you are discharged, your primary care physician will handle any further medical issues. Please note that NO REFILLS for any discharge medications will be authorized once you are discharged, as it is imperative that you return to your primary care physician (or establish a relationship with a primary care physician if you do not have one) for your aftercare needs so  that they can reassess your need for medications and monitor your lab values.    On the day of Discharge:  VITAL SIGNS:  Blood pressure 124/80, pulse 81, temperature 97.9 F (36.6 C), temperature source Oral, resp. rate 17, height 5' 11"  (1.803 m), weight 124.8 kg (275 lb 1.6 oz), SpO2 91 %. PHYSICAL EXAMINATION:  GENERAL:  66 y.o.-year-old patient lying in the bed with no acute distress.  EYES: Pupils equal, round, reactive to light and accommodation. No scleral icterus. Extraocular muscles intact.  HEENT: Head atraumatic, normocephalic. Oropharynx and nasopharynx clear.  NECK:  Supple, no jugular venous distention. No thyroid enlargement, no tenderness.  LUNGS: Normal breath sounds bilaterally, no wheezing, rales,rhonchi or crepitation. No use of accessory muscles of respiration.  CARDIOVASCULAR: S1, S2 normal. No murmurs, rubs, or gallops.  ABDOMEN: Soft, non-tender, non-distended. Bowel sounds present. No organomegaly or mass.  EXTREMITIES: No pedal edema, cyanosis, or clubbing.  NEUROLOGIC: Cranial nerves II through XII are intact. Muscle strength 5/5 in all extremities. Sensation intact. Gait not checked.  PSYCHIATRIC: The patient is alert and oriented x 3.  SKIN: No obvious rash, lesion, or ulcer.  DATA REVIEW:    CBC  Recent Labs Lab 09/09/16 0329  WBC 14.5*  HGB 12.2*  HCT 36.2*  PLT 238    Chemistries   Recent Labs Lab 09/06/16 1539  09/09/16 0329  NA 138  < > 137  K 2.8*  < > 3.7  CL 93*  < > 100*  CO2 32  < > 28  GLUCOSE 157*  < > 209*  BUN 14  < > 15  CREATININE 0.98  < > 0.97  CALCIUM 8.7*  < > 8.0*  MG  --   < > 1.9  AST 25  --   --   ALT 15*  --   --   ALKPHOS 117  --   --   BILITOT 0.7  --   --   < > = values in this interval not displayed.   Follow-up Information    Sherrin Daisy, MD. Call in 1 day(s).   Specialty:  Family Medicine Contact information: [place] [address] [phone]        [facility] Follow up.   Specialty:  Home Health Services Why:  Nurse, Physical therapy, Occupational therapy, Adie Social work Sport and exercise psychologist information: [address] [place] [address] [place] Follow up.   Why:  Rockne Menghini information: [address] [place] [address] [phone]           Management plans discussed with the patient, family and they are in agreement.  CODE STATUS: FULL CODE  TOTAL TIME TAKING CARE OF THIS PATIENT: 45 minutes.    Max Sane M.D on 09/10/2016 at 6:17 PM  Between 7am to 6pm - Pager - [phone]  After 6pm go to [email or web address] - Proofreader  [facility]  Office  [phone]  CC: Primary care physician; Sherrin Daisy, MD   Note: This dictation was prepared with Dragon dictation along with smaller phrase technology. Any transcriptional errors that result from this process are unintentional.

## 2016-09-14 ENCOUNTER — Other Ambulatory Visit: Payer: Self-pay | Admitting: Cardiovascular Disease

## 2016-09-19 ENCOUNTER — Other Ambulatory Visit: Payer: Self-pay | Admitting: Cardiovascular Disease

## 2016-09-28 ENCOUNTER — Other Ambulatory Visit: Payer: Self-pay | Admitting: Cardiovascular Disease

## 2016-10-25 ENCOUNTER — Ambulatory Visit (INDEPENDENT_AMBULATORY_CARE_PROVIDER_SITE_OTHER): Payer: Medicare Other | Admitting: *Deleted

## 2016-10-25 DIAGNOSIS — I255 Ischemic cardiomyopathy: Secondary | ICD-10-CM

## 2016-10-25 NOTE — Progress Notes (Signed)
Remote ICD transmission.   

## 2016-10-26 LAB — CUP PACEART REMOTE DEVICE CHECK
Battery Voltage: 2.98 V
Brady Statistic AS VP Percent: 0.05 %
Brady Statistic RA Percent Paced: 0.2 %
Date Time Interrogation Session: 20180327092704
HighPow Impedance: 73 Ohm
Implantable Lead Implant Date: 20150916
Implantable Lead Location: 753860
Implantable Lead Model: 5076
Implantable Pulse Generator Implant Date: 20150916
Lead Channel Impedance Value: 323 Ohm
Lead Channel Pacing Threshold Amplitude: 1.25 V
Lead Channel Pacing Threshold Pulse Width: 0.4 ms
Lead Channel Sensing Intrinsic Amplitude: 2.25 mV
Lead Channel Sensing Intrinsic Amplitude: 2.25 mV
Lead Channel Sensing Intrinsic Amplitude: 5 mV
Lead Channel Setting Pacing Amplitude: 1.75 V
Lead Channel Setting Pacing Pulse Width: 0.4 ms
MDC IDC LEAD IMPLANT DT: 20150916
MDC IDC LEAD LOCATION: 753859
MDC IDC MSMT BATTERY REMAINING LONGEVITY: 108 mo
MDC IDC MSMT LEADCHNL RA IMPEDANCE VALUE: 399 Ohm
MDC IDC MSMT LEADCHNL RA PACING THRESHOLD AMPLITUDE: 0.875 V
MDC IDC MSMT LEADCHNL RV IMPEDANCE VALUE: 266 Ohm
MDC IDC MSMT LEADCHNL RV PACING THRESHOLD PULSEWIDTH: 0.4 ms
MDC IDC MSMT LEADCHNL RV SENSING INTR AMPL: 5 mV
MDC IDC SET LEADCHNL RV PACING AMPLITUDE: 2.5 V
MDC IDC SET LEADCHNL RV SENSING SENSITIVITY: 0.3 mV
MDC IDC STAT BRADY AP VP PERCENT: 0.01 %
MDC IDC STAT BRADY AP VS PERCENT: 0.19 %
MDC IDC STAT BRADY AS VS PERCENT: 99.75 %
MDC IDC STAT BRADY RV PERCENT PACED: 0.06 %

## 2016-10-28 ENCOUNTER — Encounter: Payer: Self-pay | Admitting: Cardiology

## 2016-10-30 NOTE — Progress Notes (Signed)
Cardiology Office Note  Date:  11/01/2016   ID:  Colton Lamb, DOB 07-13-1951, MRN [id number]  PCP:  Sherrin Daisy, MD   Chief Complaint  Patient presents with  . other    6 month f/u no complaints today. Meds reviewed verbally with pt.    HPI:  Colton Lamb is a 66 year old gentleman with a history of  paroxysmal atrial fibrillation,   hyperlipidemia,  Crohn's ileocolitis,  coronary artery disease, PCI x3 to the LAD, circumflex, PDA and PL branch, catheterization in 2008  with a cypher stent to the OM,  Ejection fraction 30-35% in July 2015 by echo sleep apnea, has not been using CPAP poorly controlled diabetes, little follow-up with his primary care S/p catheter ablation of atrial flutter and insertion of a DDD ICD. no syncope or ICD shock. He presents today for follow-up of his coronary artery disease, atrial fibrillation  In follow-up today, he reports that he feels well No significant shortness of breath on exertion No significant leg edema   in the ER in May 2017, stomach bug, N/V, diarrhea Potassium noted to be mildly low at that time  February had episodes of Vertigo, dry heaves Multiple epsiodes Admitted to hospital 09/04/2016 N/V Potassium 2.8 Improved up to 3.9 on recheck  Currently On torsemide 20 BID tolerating entresto twice a day  EKG on today's visit shows normal sinus rhythm with rate 75 bpm, old anterior MI  Other past medical history Admitted in April 2015 with atrial flutter, non-ST elevation MI, with cardioversion, cardiac catheterization showing occluded LAD,  moderate distal RCA disease, patent left circumflex.. Medical management recommended.  Severely depressed ejection fraction noted, LifeVest was placed at that time. Ejection fraction 20-25%   hospital admission May 13 with discharge 12/14/2013 with dig toxicity, junctional rhythm, bradycardia with heart rates in the 30s, syncope.  Acute renal failure likely secondary to ATN. Also  noted to have TSH of 10. ACE inhibitor, spironolactone, isosorbide was held. Renal function improved at the time of discharge  Followup echocardiogram 01/29/2014 showing improved ejection fraction up to 30-35%   PMH:   has a past medical history of Atrial flutter ([place]); CHF (congestive heart failure) ([place]); Chronic systolic heart failure ([place]); Coronary artery disease; Crohn's ileocolitis ([place]); GERD (gastroesophageal reflux disease); adenomatous colonic polyps (11/2003); Hyperlipidemia; Hypertension; Ischemic cardiomyopathy; Obesity; Paroxysmal atrial fibrillation ([place]); Sleep apnea; Syncope; and Type II diabetes mellitus ([place]).  PSH:    Past Surgical History:  Procedure Laterality Date  . ATRIAL FLUTTER ABLATION N/A 04/16/2014   Procedure: ATRIAL FLUTTER ABLATION;  Surgeon: Evans Lance, MD;  Location: [facility] CATH LAB;  Service: Cardiovascular;  Laterality: N/A;  . CARDIAC CATHETERIZATION  10/2013  . CARDIAC DEFIBRILLATOR PLACEMENT  04/16/2014   Medtronic Evira device  . CARDIAC ELECTROPHYSIOLOGY STUDY AND ABLATION  04/16/2014   atrial flutter ablation  . CARDIOVERSION N/A 03/05/2014   Procedure: CARDIOVERSION;  Surgeon: Jolaine Artist, MD;  [facility] END[facility];  Service: Cardiovascular;  Laterality: N/A;  . CORONARY ANGIOPLASTY WITH STENT PLACEMENT  2007; 2008 X 2   "1+1 ~ 1"  . FOOT SURGERY Left    bone spur  . HYDROCELE EXCISION Bilateral   . 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 IMPLANTABLE CARDIOVERTER DEFIBRILLATOR IMPLANT N/A 04/16/2014   Procedure: IMPLANTABLE CARDIOVERTER DEFIBRILLATOR IMPLANT;  Surgeon: Evans Lance, MD;  Location: [facility] CATH LAB;  Service: Cardiovascular;  Laterality: N/A;  . LEFT HEART CATHETERIZATION WITH CORONARY ANGIOGRAM N/A 11/22/2013   Procedure:  LEFT HEART CATHETERIZATION WITH CORONARY ANGIOGRAM;  Surgeon: Sinclair Grooms, MD;  Location: [facility] CATH LAB;  Service: Cardiovascular;  Laterality: N/A;    Current Outpatient  Prescriptions  Medication Sig Dispense Refill  . acetaminophen (TYLENOL) 325 MG tablet Take 1-2 tablets (325-650 mg total) by mouth every 4 (four) hours as needed for mild pain.    Marland Kitchen amiodarone (PACERONE) 200 MG tablet TAKE 1 TABLET BY MOUTH MON.-FRIDAY AND 1/2 TABLET BY MOUTH SATURDAY AND SUNDAY. 30 tablet 7  . aspirin 81 MG tablet Take 81 mg by mouth daily.     Marland Kitchen atorvastatin (LIPITOR) 80 MG tablet TAKE 1 TABLET (80 MG TOTAL) BY MOUTH DAILY. 30 tablet 3  . balsalazide (COLAZAL) 750 MG capsule TAKE 1 CAPSULE (750 MG TOTAL) BY MOUTH 3 (THREE) TIMES DAILY. 90 capsule 11  . carvedilol (COREG) 12.5 MG tablet TAKE 1 TABLET (12.5 MG TOTAL) BY MOUTH 2 (TWO) TIMES DAILY. 60 tablet 3  . ENTRESTO 49-51 MG TAKE 1 TABLET BY MOUTH 2 (TWO) TIMES DAILY. 60 tablet 3  . metFORMIN (GLUCOPHAGE) 1000 MG tablet Take 1,000 mg by mouth 2 (two) times daily with a meal.     . niacin (NIASPAN) 1000 MG CR tablet TAKE 1 TABLET BY MOUTH AT BEDTIME 30 tablet 3  . nitroGLYCERIN (NITROSTAT) 0.4 MG SL tablet Place 0.4 mg under the tongue every 5 (five) minutes as needed (MAX 3 TABLETS). FOR CHEST PAIN    . omeprazole (PRILOSEC) 20 MG capsule Take 20 mg by mouth daily.     . ondansetron (ZOFRAN) 4 MG tablet Take 1 tablet (4 mg total) by mouth every 8 (eight) hours as needed for nausea or vomiting. 21 tablet 0  . torsemide (DEMADEX) 20 MG tablet Take 1 tablet (20 mg total) by mouth 2 (two) times daily. 180 tablet 3  . XARELTO 20 MG TABS tablet TAKE 1 TABLET BY MOUTH EVERY DAY WITH LUNCH 30 tablet 3  . meclizine (ANTIVERT) 25 MG tablet Take 1 tablet (25 mg total) by mouth 3 (three) times daily as needed. 60 tablet 1  . potassium chloride (K-DUR) 10 MEQ tablet Take 1 tablet (10 mEq total) by mouth daily. 90 tablet 3   No current facility-administered medications for this visit.      Allergies:   Iodine; Shrimp [shellfish allergy]; and Tetracycline   Social History:  The patient  reports that he has never smoked. He has never  used smokeless tobacco. He reports that he does not drink alcohol or use drugs.   Family History:   family history includes Breast cancer in his mother; Heart attack in his father; Heart disease in his father.    Review of Systems: Review of Systems  Constitutional: Negative.   Respiratory: Negative.   Cardiovascular: Negative.   Gastrointestinal: Negative.   Musculoskeletal: Negative.   Neurological: Negative.   Psychiatric/Behavioral: Negative.   All other systems reviewed and are negative.    PHYSICAL EXAM: VS:  BP 100/64 (BP Location: Left Arm, Patient Position: Sitting, Cuff Size: Normal)   Pulse 75   Ht 5' 11"  (1.803 m)   Wt 273 lb 8 oz (124.1 kg)   BMI 38.15 kg/m  , BMI Body mass index is 38.15 kg/m. GEN: Well nourished, well developed, in no acute distress, obese  HEENT: normal  Neck: no JVD, carotid bruits, or masses Cardiac: RRR; no murmurs, rubs, or gallops,no edema  Respiratory:  clear to auscultation bilaterally, normal work of breathing GI: soft, nontender, nondistended, +  BS MS: no deformity or atrophy  Skin: warm and dry, no rash Neuro:  Strength and sensation are intact Psych: euthymic mood, full affect    Recent Labs: 09/06/2016: ALT 15 09/07/2016: TSH 3.060 09/09/2016: BUN 15; Creatinine, Ser 0.97; Hemoglobin 12.2; Magnesium 1.9; Platelets 238; Potassium 3.7; Sodium 137    Lipid Panel Lab Results  Component Value Date   CHOL 112 05/09/2016   HDL 35 (L) 05/09/2016   LDLCALC 57 05/09/2016   TRIG 100 05/09/2016      Wt Readings from Last 3 Encounters:  11/01/16 273 lb 8 oz (124.1 kg)  09/07/16 275 lb 1.6 oz (124.8 kg)  05/18/16 286 lb 6 oz (129.9 kg)       ASSESSMENT AND PLAN:  Paroxysmal atrial fibrillation (HCC) - Plan: EKG 66-YQIH, Basic Metabolic Panel (BMET) Maintaining normal sinus rhythm, on amiodarone, anticoagulation Denies any symptoms concerning for paroxysmal episodes  Cardiomyopathy, ischemic - Plan: EKG 12-Lead Previous  ejection fraction 35% Reports feeling well on his current medication regiment, will continue carvedilol, torsemide, entresto.  BP low  Chronic systolic heart failure (HCC) - Plan: EKG 47-QQVZ, Basic Metabolic Panel (BMET) as above, reports he is stable  No changes to his medications , BMP stable  Hyperlipidemia, unspecified hyperlipidemia type - We have ordered repeat lab work,Goal LDL less than 70   Type 2 diabetes mellitus without complication, with long-term current use of insulin ([place]) - Plan: HgB A1c Weight trending down  Vertigo Take meclizine as needed  Hypokalemia We will add potassium one a day   Total encounter time more than 25 minutes  Greater than 50% was spent in counseling and coordination of care with the patient   Disposition:   F/U  6 months   Orders Placed This Encounter  Procedures  . EKG 12-Lead     Signed, Esmond Plants, M.D., Ph.D. 11/01/2016  [place], [place]

## 2016-11-01 ENCOUNTER — Encounter: Payer: Self-pay | Admitting: Cardiovascular Disease

## 2016-11-01 ENCOUNTER — Ambulatory Visit (INDEPENDENT_AMBULATORY_CARE_PROVIDER_SITE_OTHER): Payer: Medicare Other | Admitting: Cardiovascular Disease

## 2016-11-01 VITALS — BP 100/64 | HR 75 | Ht 71.0 in | Wt 273.5 lb

## 2016-11-01 DIAGNOSIS — I5022 Chronic systolic (congestive) heart failure: Secondary | ICD-10-CM

## 2016-11-01 DIAGNOSIS — E782 Mixed hyperlipidemia: Secondary | ICD-10-CM | POA: Diagnosis not present

## 2016-11-01 DIAGNOSIS — I480 Paroxysmal atrial fibrillation: Secondary | ICD-10-CM | POA: Diagnosis not present

## 2016-11-01 DIAGNOSIS — I255 Ischemic cardiomyopathy: Secondary | ICD-10-CM

## 2016-11-01 DIAGNOSIS — Z9861 Coronary angioplasty status: Secondary | ICD-10-CM

## 2016-11-01 DIAGNOSIS — I483 Typical atrial flutter: Secondary | ICD-10-CM | POA: Diagnosis not present

## 2016-11-01 DIAGNOSIS — I5041 Acute combined systolic (congestive) and diastolic (congestive) heart failure: Secondary | ICD-10-CM | POA: Diagnosis not present

## 2016-11-01 DIAGNOSIS — I2510 Atherosclerotic heart disease of native coronary artery without angina pectoris: Secondary | ICD-10-CM | POA: Diagnosis not present

## 2016-11-01 MED ORDER — MECLIZINE HCL 25 MG PO TABS: 25.0000 mg | ORAL_TABLET | Freq: Three times a day (TID) | ORAL | 1 refills | Status: DC | PRN

## 2016-11-01 MED ORDER — POTASSIUM CHLORIDE ER 10 MEQ PO TBCR: 10.0000 meq | EXTENDED_RELEASE_TABLET | Freq: Every day | ORAL | 3 refills | Status: DC

## 2016-11-01 MED ORDER — TORSEMIDE 20 MG PO TABS: 20.0000 mg | ORAL_TABLET | Freq: Two times a day (BID) | ORAL | 3 refills | Status: DC

## 2016-11-01 NOTE — Patient Instructions (Addendum)
Medication Instructions:   Please add potassium 10 meq pill daily to torsemide daily  Take meclizine for dizzy epsiodes  Labwork:  No new labs needed  Testing/Procedures:  No further testing at this time   I recommend watching educational videos on topics of interest to you at:       [email or web address]  Enter code: HEARTCARE    Follow-Up: It was a pleasure seeing you in the office today. Please call us if you have new issues that need to be addressed before your next appt.  [phone]  Your physician wants you to follow-up in: 6 months.  You will receive a reminder letter in the mail two months in advance. If you don't receive a letter, please call our office to schedule the follow-up appointment.  If you need a refill on your cardiac medications before your next appointment, please call your pharmacy.

## 2016-11-04 ENCOUNTER — Telehealth: Payer: Self-pay | Admitting: Cardiovascular Disease

## 2016-11-04 NOTE — Telephone Encounter (Signed)
Contacted patient. Per Dr. Donivan Scull last note he is to take Torsemide 20 mg 1 tablet twice a day. Patient is aware and stated he had been taking it that way but did just was to verify that was correct.

## 2016-11-04 NOTE — Telephone Encounter (Signed)
Pt c/o medication issue:  1. Name of Medication: torsemide 20 mg   2. How are you currently taking this medication (dosage and times per day)? Twice a day one in morning and one in the evening, but once he got home after seeing [place], he looked at the bottle. He saw that he is to take it twice in the morning and twice in the evening.   3. Are you having a reaction (difficulty breathing--STAT)? No   4. What is your medication issue? Not sure about how much he was to take

## 2016-12-14 ENCOUNTER — Other Ambulatory Visit: Payer: Self-pay | Admitting: Internal Medicine

## 2016-12-14 ENCOUNTER — Other Ambulatory Visit: Payer: Self-pay | Admitting: Cardiovascular Disease

## 2016-12-19 ENCOUNTER — Other Ambulatory Visit: Payer: Self-pay | Admitting: Cardiovascular Disease

## 2016-12-22 ENCOUNTER — Other Ambulatory Visit: Payer: Self-pay | Admitting: Cardiovascular Disease

## 2016-12-29 NOTE — Discharge Instructions (Signed)
Cataract Surgery, Care After Refer to this sheet in the next few weeks. These instructions provide you with information about caring for yourself after your procedure. Your health care provider may also give you more specific instructions. Your treatment has been planned according to current medical practices, but problems sometimes occur. Call your health care provider if you have any problems or questions after your procedure. What can I expect after the procedure? After the procedure, it is common to have:  Itching.  Discomfort.  Fluid discharge.  Sensitivity to light and to touch.  Bruising.  Follow these instructions at home: [place] your eye every day for signs of infection. Watch for: ? Redness, swelling, or pain. ? Fluid, blood, or pus. ? Warmth. ? Bad smell. Activity  Avoid strenuous activities, such as playing contact sports, for as long as told by your health care provider.  Do not drive or operate heavy machinery until your health care provider approves.  Do not bend or lift heavy objects. Bending increases pressure in the eye. You can walk, climb stairs, and do light household chores.  Ask your health care provider when you can return to work. If you work in a dusty environment, you may be advised to wear protective eyewear for a period of time. General instructions  Take or apply over-the-counter and prescription medicines only as told by your health care provider. This includes eye drops.  Do not touch or rub your eyes.  If you were given a protective shield, wear it as told by your health care provider. If you were not given a protective shield, wear sunglasses as told by your health care provider to protect your eyes.  Keep the area around your eye clean and dry. Avoid swimming or allowing water to hit you directly in the face while showering until told by your health care provider. Keep soap and shampoo out of your eyes.  Do not put a contact lens  into the affected eye or eyes until your health care provider approves.  Keep all follow-up visits as told by your health care provider. This is important. Contact a health care provider if:   You have increased bruising around your eye.  You have pain that is not helped with medicine.  You have a fever.  You have redness, swelling, or pain in your eye.  You have fluid, blood, or pus coming from your incision.  Your vision gets worse. Get help right away if:  You have sudden vision loss. This information is not intended to replace advice given to you by your health care provider. Make sure you discuss any questions you have with your health care provider. Document Released: 02/04/2005 Document Revised: 11/26/2015 Document Reviewed: 05/28/2015 Elsevier Interactive Patient Education  2017 [place] Anesthesia, Adult, Care After These instructions provide you with information about caring for yourself after your procedure. Your health care provider may also give you more specific instructions. Your treatment has been planned according to current medical practices, but problems sometimes occur. Call your health care provider if you have any problems or questions after your procedure. What can I expect after the procedure? After the procedure, it is common to have:  Vomiting.  A sore throat.  Mental slowness.  It is common to feel:  Nauseous.  Cold or shivery.  Sleepy.  Tired.  Sore or achy, even in parts of your body where you did not have surgery.  Follow these instructions at home: For  at least 24 hours after the procedure:  Do not: ? Participate in activities where you could fall or become injured. ? Drive. ? Use heavy machinery. ? Drink alcohol. ? Take sleeping pills or medicines that cause drowsiness. ? Make important decisions or sign legal documents. ? Take care of children on your own.  Rest. Eating and drinking  If you vomit, drink  water, juice, or soup when you can drink without vomiting.  Drink enough fluid to keep your urine clear or pale yellow.  Make sure you have little or no nausea before eating solid foods.  Follow the diet recommended by your health care provider. General instructions  Have a responsible adult stay with you until you are awake and alert.  Return to your normal activities as told by your health care provider. Ask your health care provider what activities are safe for you.  Take over-the-counter and prescription medicines only as told by your health care provider.  If you smoke, do not smoke without supervision.  Keep all follow-up visits as told by your health care provider. This is important. Contact a health care provider if:  You continue to have nausea or vomiting at home, and medicines are not helpful.  You cannot drink fluids or start eating again.  You cannot urinate after 8-12 hours.  You develop a skin rash.  You have fever.  You have increasing redness at the site of your procedure. Get help right away if:  You have difficulty breathing.  You have chest pain.  You have unexpected bleeding.  You feel that you are having a life-threatening or urgent problem. This information is not intended to replace advice given to you by your health care provider. Make sure you discuss any questions you have with your health care provider. Document Released: 10/24/2000 Document Revised: 12/21/2015 Document Reviewed: 07/02/2015 Elsevier Interactive Patient Education  [employer].

## 2017-01-02 ENCOUNTER — Telehealth: Payer: Self-pay | Admitting: Internal Medicine

## 2017-01-02 NOTE — Telephone Encounter (Signed)
Received cardiac clearance request for pt to proceed w/ cataract surgery on 01/04/17.   The anesthesiologist require the completion of paperwork for all pacemaker/defibrillator pts. Please complete form and fax back to [facility] @ [phone]. Pt sees Dr. Lovena Le for pacer checks. Request faxed to [facility] office for Dr. Lovena Le to review.

## 2017-01-04 ENCOUNTER — Ambulatory Visit: Payer: Medicare Other | Admitting: Anesthesiology

## 2017-01-04 ENCOUNTER — Ambulatory Visit
Admission: RE | Admit: 2017-01-04 | Discharge: 2017-01-04 | Disposition: A | Discharge status: Home / Self Care | Payer: Medicare Other | Source: Ambulatory Visit | Attending: Ophthalmology | Admitting: Ophthalmology

## 2017-01-04 ENCOUNTER — Encounter
Admission: RE | Disposition: A | Discharge status: Home / Self Care | Payer: Self-pay | Source: Ambulatory Visit | Attending: Ophthalmology

## 2017-01-04 DIAGNOSIS — K219 Gastro-esophageal reflux disease without esophagitis: Secondary | ICD-10-CM | POA: Diagnosis not present

## 2017-01-04 DIAGNOSIS — I509 Heart failure, unspecified: Secondary | ICD-10-CM | POA: Insufficient documentation

## 2017-01-04 DIAGNOSIS — I110 Hypertensive heart disease with heart failure: Secondary | ICD-10-CM | POA: Diagnosis not present

## 2017-01-04 DIAGNOSIS — G4730 Sleep apnea, unspecified: Secondary | ICD-10-CM | POA: Insufficient documentation

## 2017-01-04 DIAGNOSIS — Z7982 Long term (current) use of aspirin: Secondary | ICD-10-CM | POA: Diagnosis not present

## 2017-01-04 DIAGNOSIS — I255 Ischemic cardiomyopathy: Secondary | ICD-10-CM | POA: Insufficient documentation

## 2017-01-04 DIAGNOSIS — E7800 Pure hypercholesterolemia, unspecified: Secondary | ICD-10-CM | POA: Insufficient documentation

## 2017-01-04 DIAGNOSIS — I2510 Atherosclerotic heart disease of native coronary artery without angina pectoris: Secondary | ICD-10-CM | POA: Diagnosis not present

## 2017-01-04 DIAGNOSIS — Z881 Allergy status to other antibiotic agents status: Secondary | ICD-10-CM | POA: Diagnosis not present

## 2017-01-04 DIAGNOSIS — I480 Paroxysmal atrial fibrillation: Secondary | ICD-10-CM | POA: Diagnosis not present

## 2017-01-04 DIAGNOSIS — Z888 Allergy status to other drugs, medicaments and biological substances status: Secondary | ICD-10-CM | POA: Diagnosis not present

## 2017-01-04 DIAGNOSIS — Z955 Presence of coronary angioplasty implant and graft: Secondary | ICD-10-CM | POA: Insufficient documentation

## 2017-01-04 DIAGNOSIS — I252 Old myocardial infarction: Secondary | ICD-10-CM | POA: Insufficient documentation

## 2017-01-04 DIAGNOSIS — Z7984 Long term (current) use of oral hypoglycemic drugs: Secondary | ICD-10-CM | POA: Insufficient documentation

## 2017-01-04 DIAGNOSIS — H2511 Age-related nuclear cataract, right eye: Secondary | ICD-10-CM | POA: Insufficient documentation

## 2017-01-04 DIAGNOSIS — E119 Type 2 diabetes mellitus without complications: Secondary | ICD-10-CM | POA: Insufficient documentation

## 2017-01-04 DIAGNOSIS — Z95810 Presence of automatic (implantable) cardiac defibrillator: Secondary | ICD-10-CM | POA: Diagnosis not present

## 2017-01-04 HISTORY — PX: CATARACT EXTRACTION W/PHACO: SHX586

## 2017-01-04 LAB — GLUCOSE, CAPILLARY
GLUCOSE-CAPILLARY: 124 mg/dL — AB (ref 65–99)
GLUCOSE-CAPILLARY: 126 mg/dL — AB (ref 65–99)

## 2017-01-04 SURGERY — PHACOEMULSIFICATION, CATARACT, WITH IOL INSERTION
Anesthesia: Monitor Anesthesia Care | Site: Eye | Laterality: Right | Wound class: Clean

## 2017-01-04 MED ORDER — BRIMONIDINE TARTRATE-TIMOLOL 0.2-0.5 % OP SOLN
OPHTHALMIC | Status: DC | PRN
  Administered 2017-01-04: 1 [drp] via OPHTHALMIC

## 2017-01-04 MED ORDER — ARMC OPHTHALMIC DILATING DROPS
1.0000 "application " | OPHTHALMIC | Status: DC | PRN
  Administered 2017-01-04 (×3): 1 via OPHTHALMIC

## 2017-01-04 MED ORDER — NA HYALUR & NA CHOND-NA HYALUR 0.4-0.35 ML IO KIT
PACK | INTRAOCULAR | Status: DC | PRN
  Administered 2017-01-04: 1 mL via INTRAOCULAR

## 2017-01-04 MED ORDER — MOXIFLOXACIN HCL 0.5 % OP SOLN
1.0000 [drp] | OPHTHALMIC | Status: DC | PRN
  Administered 2017-01-04 (×3): 1 [drp] via OPHTHALMIC

## 2017-01-04 MED ORDER — FENTANYL CITRATE (PF) 100 MCG/2ML IJ SOLN
INTRAMUSCULAR | Status: DC | PRN
  Administered 2017-01-04: 50 ug via INTRAVENOUS

## 2017-01-04 MED ORDER — ACETAMINOPHEN 325 MG PO TABS: 325.0000 mg | ORAL_TABLET | ORAL | Status: DC | PRN

## 2017-01-04 MED ORDER — LIDOCAINE HCL (PF) 2 % IJ SOLN
INTRAOCULAR | Status: DC | PRN
  Administered 2017-01-04: 1 mL via INTRAOCULAR

## 2017-01-04 MED ORDER — EPINEPHRINE PF 1 MG/ML IJ SOLN
INTRAOCULAR | Status: DC | PRN
  Administered 2017-01-04: 74 mL via OPHTHALMIC

## 2017-01-04 MED ORDER — ACETAMINOPHEN 160 MG/5ML PO SOLN: 325.0000 mg | ORAL | Status: DC | PRN

## 2017-01-04 MED ORDER — MIDAZOLAM HCL 2 MG/2ML IJ SOLN
INTRAMUSCULAR | Status: DC | PRN
  Administered 2017-01-04: 2 mg via INTRAVENOUS

## 2017-01-04 MED ORDER — ONDANSETRON HCL 4 MG/2ML IJ SOLN: 4.0000 mg | Freq: Once | INTRAMUSCULAR | Status: DC | PRN

## 2017-01-04 MED ORDER — CEFUROXIME OPHTHALMIC INJECTION 1 MG/0.1 ML
INJECTION | OPHTHALMIC | Status: DC | PRN
  Administered 2017-01-04: .3 mL via OPHTHALMIC

## 2017-01-04 SURGICAL SUPPLY — 25 items
CANNULA ANT/CHMB 27GA (MISCELLANEOUS) ×3 IMPLANT
CARTRIDGE ABBOTT (MISCELLANEOUS) IMPLANT
GLOVE SURG LX 7.5 STRW (GLOVE) ×2
GLOVE SURG LX STRL 7.5 STRW (GLOVE) ×1 IMPLANT
GLOVE SURG TRIUMPH 8.0 PF LTX (GLOVE) ×3 IMPLANT
GOWN STRL REUS W/ TWL LRG LVL3 (GOWN DISPOSABLE) ×2 IMPLANT
GOWN STRL REUS W/TWL LRG LVL3 (GOWN DISPOSABLE) ×4
LENS IOL TECNIS ITEC 20.5 (Intraocular Lens) ×3 IMPLANT
MARKER SKIN DUAL TIP RULER LAB (MISCELLANEOUS) ×3 IMPLANT
NDL RETROBULBAR .5 NSTRL (NEEDLE) IMPLANT
NEEDLE FILTER BLUNT 18X 1/2SAF (NEEDLE) ×2
NEEDLE FILTER BLUNT 18X1 1/2 (NEEDLE) ×1 IMPLANT
PACK CATARACT BRASINGTON (MISCELLANEOUS) ×3 IMPLANT
PACK EYE AFTER SURG (MISCELLANEOUS) ×3 IMPLANT
PACK OPTHALMIC (MISCELLANEOUS) ×3 IMPLANT
RING MALYGIN 7.0 (MISCELLANEOUS) IMPLANT
SUT ETHILON 10-0 CS-B-6CS-B-6 (SUTURE)
SUT VICRYL  9 0 (SUTURE)
SUT VICRYL 9 0 (SUTURE) IMPLANT
SUTURE EHLN 10-0 CS-B-6CS-B-6 (SUTURE) IMPLANT
SYR 3ML LL SCALE MARK (SYRINGE) ×3 IMPLANT
SYR 5ML LL (SYRINGE) ×3 IMPLANT
SYR TB 1ML LUER SLIP (SYRINGE) ×3 IMPLANT
WATER STERILE IRR 250ML POUR (IV SOLUTION) ×3 IMPLANT
WIPE NON LINTING 3.25X3.25 (MISCELLANEOUS) ×3 IMPLANT

## 2017-01-04 NOTE — Anesthesia Procedure Notes (Signed)
Procedure Name: MAC Performed by: Arnecia Ector Pre-anesthesia Checklist: Patient identified, Emergency Drugs available, Suction available, Timeout performed and Patient being monitored Patient Re-evaluated:Patient Re-evaluated prior to inductionOxygen Delivery Method: Nasal cannula Placement Confirmation: positive ETCO2     

## 2017-01-04 NOTE — Anesthesia Preprocedure Evaluation (Signed)
Anesthesia Evaluation  Patient identified by MRN, date of birth, ID band Patient awake    Reviewed: Allergy & Precautions, H&P , NPO status   Airway Mallampati: II  TM Distance: >3 FB     Dental  (+) Missing Loose upper left tooth:   Pulmonary sleep apnea ,    breath sounds clear to auscultation       Cardiovascular hypertension, + CAD, + Past MI and +CHF (ischemic cardiomyopathy)  + dysrhythmias (paroxysmal afib) + Cardiac Defibrillator  Rhythm:regular Rate:Normal  Last cardiology visit 4/3  TTE 2015 - EF 30-35%   Neuro/Psych    GI/Hepatic GERD  ,  Endo/Other  diabetes  Renal/GU Renal disease     Musculoskeletal   Abdominal   Peds  Hematology   Anesthesia Other Findings   Reproductive/Obstetrics                             Anesthesia Physical Anesthesia Plan  ASA: III  Anesthesia Plan: MAC   Post-op Pain Management:    Induction:   PONV Risk Score and Plan: 1  Airway Management Planned:   Additional Equipment:   Intra-op Plan:   Post-operative Plan:   Informed Consent: I have reviewed the patients History and Physical, chart, labs and discussed the procedure including the risks, benefits and alternatives for the proposed anesthesia with the patient or authorized representative who has indicated his/her understanding and acceptance.     Plan Discussed with: CRNA  Anesthesia Plan Comments:         Anesthesia Quick Evaluation

## 2017-01-04 NOTE — Op Note (Signed)
LOCATION:  [facility]   PREOPERATIVE DIAGNOSIS:    Nuclear sclerotic cataract right eye. H25.11   POSTOPERATIVE DIAGNOSIS:  Nuclear sclerotic cataract right eye.     PROCEDURE:  Phacoemusification with posterior chamber intraocular lens placement of the right eye   LENS:   Implant Name Type Inv. Item Serial No. Manufacturer Lot No. LRB No. Used  LENS IOL DIOP 20.5 - [id number] Intraocular Lens LENS IOL DIOP 20.5 [id number]   Right 1        ULTRASOUND TIME: 20 % of 1 minutes, 27 seconds.  CDE 17.6   SURGEON:  Wyonia Hough, MD   ANESTHESIA:  Topical with tetracaine drops and 2% Xylocaine jelly, augmented with 1% preservative-free intracameral lidocaine.    COMPLICATIONS:  None.   DESCRIPTION OF PROCEDURE:  The patient was identified in the holding room and transported to the operating room and placed in the supine position under the operating microscope.  The right eye was identified as the operative eye and it was prepped and draped in the usual sterile ophthalmic fashion.   A 1 millimeter clear-corneal paracentesis was made at the 12:00 position.  0.5 ml of preservative-free 1% lidocaine was injected into the anterior chamber. The anterior chamber was filled with Viscoat viscoelastic.  A 2.4 millimeter keratome was used to make a near-clear corneal incision at the 9:00 position.  A curvilinear capsulorrhexis was made with a cystotome and capsulorrhexis forceps.  Balanced salt solution was used to hydrodissect and hydrodelineate the nucleus.   Phacoemulsification was then used in stop and chop fashion to remove the lens nucleus and epinucleus.  The remaining cortex was then removed using the irrigation and aspiration handpiece. Provisc was then placed into the capsular bag to distend it for lens placement.  A lens was then injected into the capsular bag.  The remaining viscoelastic was aspirated.   Wounds were hydrated with balanced salt solution.  The anterior  chamber was inflated to a physiologic pressure with balanced salt solution.  No wound leaks were noted. Cefuroxime 0.1 ml of a 53m/ml solution was injected into the anterior chamber for a dose of 1 mg of intracameral antibiotic at the completion of the case.   Timolol and Brimonidine drops were applied to the eye.  The patient was taken to the recovery room in stable condition without complications of anesthesia or surgery.   Colton Lamb 01/04/2017, 8:07 AM

## 2017-01-04 NOTE — Anesthesia Postprocedure Evaluation (Signed)
Anesthesia Post Note  Patient: Colton Lamb  Procedure(s) Performed: Procedure(s) (LRB): CATARACT EXTRACTION PHACO AND INTRAOCULAR LENS PLACEMENT (IOC)  Right Diabetic Complicated (Right)  Patient location during evaluation: PACU Anesthesia Type: MAC Level of consciousness: awake and alert Pain management: pain level controlled Vital Signs Assessment: post-procedure vital signs reviewed and stable Respiratory status: spontaneous breathing, nonlabored ventilation and respiratory function stable Cardiovascular status: stable Anesthetic complications: no    Veda Canning

## 2017-01-04 NOTE — H&P (Signed)
The History and Physical notes are on paper, have been signed, and are to be scanned. The patient remains stable and unchanged from the H&P.   Previous H&P reviewed, patient examined, and there are no changes.  Colton Lamb 01/04/2017 7:41 AM

## 2017-01-04 NOTE — Transfer of Care (Signed)
Immediate Anesthesia Transfer of Care Note  Patient: Colton Lamb  Procedure(s) Performed: Procedure(s) with comments: CATARACT EXTRACTION PHACO AND INTRAOCULAR LENS PLACEMENT (IOC)  Right Diabetic Complicated (Right) - Diabetic  Patient Location: PACU  Anesthesia Type: MAC  Level of Consciousness: awake, alert  and patient cooperative  Airway and Oxygen Therapy: Patient Spontanous Breathing and Patient connected to supplemental oxygen  Post-op Assessment: Post-op Vital signs reviewed, Patient's Cardiovascular Status Stable, Respiratory Function Stable, Patent Airway and No signs of Nausea or vomiting  Post-op Vital Signs: Reviewed and stable  Complications: No apparent anesthesia complications

## 2017-01-05 ENCOUNTER — Encounter: Payer: Self-pay | Admitting: Ophthalmology

## 2017-01-17 ENCOUNTER — Other Ambulatory Visit: Payer: Self-pay | Admitting: Cardiovascular Disease

## 2017-01-24 ENCOUNTER — Ambulatory Visit (INDEPENDENT_AMBULATORY_CARE_PROVIDER_SITE_OTHER): Payer: Medicare Other | Admitting: *Deleted

## 2017-01-24 ENCOUNTER — Telehealth: Payer: Self-pay | Admitting: Cardiology

## 2017-01-24 DIAGNOSIS — I255 Ischemic cardiomyopathy: Secondary | ICD-10-CM

## 2017-01-24 NOTE — Telephone Encounter (Signed)
LMOVM reminding pt to send remote transmission.   

## 2017-01-25 ENCOUNTER — Encounter: Payer: Self-pay | Admitting: Cardiology

## 2017-01-25 LAB — CUP PACEART REMOTE DEVICE CHECK
Battery Remaining Longevity: 105 mo
Battery Voltage: 2.97 V
Brady Statistic AP VP Percent: 0 %
Brady Statistic AS VP Percent: 0.04 %
Brady Statistic RA Percent Paced: 0.2 %
Brady Statistic RV Percent Paced: 0.04 %
Date Time Interrogation Session: 20180626193327
HIGH POWER IMPEDANCE MEASURED VALUE: 73 Ohm
Implantable Lead Implant Date: 20150916
Implantable Lead Location: 753859
Implantable Lead Location: 753860
Implantable Lead Model: 5076
Implantable Pulse Generator Implant Date: 20150916
Lead Channel Impedance Value: 266 Ohm
Lead Channel Impedance Value: 323 Ohm
Lead Channel Pacing Threshold Pulse Width: 0.4 ms
Lead Channel Sensing Intrinsic Amplitude: 4.75 mV
Lead Channel Sensing Intrinsic Amplitude: 4.75 mV
Lead Channel Setting Pacing Amplitude: 1.75 V
Lead Channel Setting Pacing Pulse Width: 0.4 ms
Lead Channel Setting Sensing Sensitivity: 0.3 mV
MDC IDC LEAD IMPLANT DT: 20150916
MDC IDC MSMT LEADCHNL RA IMPEDANCE VALUE: 399 Ohm
MDC IDC MSMT LEADCHNL RA PACING THRESHOLD AMPLITUDE: 0.875 V
MDC IDC MSMT LEADCHNL RA SENSING INTR AMPL: 1.75 mV
MDC IDC MSMT LEADCHNL RA SENSING INTR AMPL: 1.75 mV
MDC IDC MSMT LEADCHNL RV PACING THRESHOLD AMPLITUDE: 1.125 V
MDC IDC MSMT LEADCHNL RV PACING THRESHOLD PULSEWIDTH: 0.4 ms
MDC IDC SET LEADCHNL RV PACING AMPLITUDE: 2.25 V
MDC IDC STAT BRADY AP VS PERCENT: 0.19 %
MDC IDC STAT BRADY AS VS PERCENT: 99.76 %

## 2017-01-25 NOTE — Progress Notes (Signed)
Remote ICD transmission.   

## 2017-01-31 ENCOUNTER — Encounter: Payer: Self-pay | Admitting: *Deleted

## 2017-01-31 NOTE — Discharge Instructions (Signed)
Cataract Surgery, Care After Refer to this sheet in the next few weeks. These instructions provide you with information about caring for yourself after your procedure. Your health care provider may also give you more specific instructions. Your treatment has been planned according to current medical practices, but problems sometimes occur. Call your health care provider if you have any problems or questions after your procedure. What can I expect after the procedure? After the procedure, it is common to have:  Itching.  Discomfort.  Fluid discharge.  Sensitivity to light and to touch.  Bruising.  Follow these instructions at home: [place] your eye every day for signs of infection. Watch for: ? Redness, swelling, or pain. ? Fluid, blood, or pus. ? Warmth. ? Bad smell. Activity  Avoid strenuous activities, such as playing contact sports, for as long as told by your health care provider.  Do not drive or operate heavy machinery until your health care provider approves.  Do not bend or lift heavy objects. Bending increases pressure in the eye. You can walk, climb stairs, and do light household chores.  Ask your health care provider when you can return to work. If you work in a dusty environment, you may be advised to wear protective eyewear for a period of time. General instructions  Take or apply over-the-counter and prescription medicines only as told by your health care provider. This includes eye drops.  Do not touch or rub your eyes.  If you were given a protective shield, wear it as told by your health care provider. If you were not given a protective shield, wear sunglasses as told by your health care provider to protect your eyes.  Keep the area around your eye clean and dry. Avoid swimming or allowing water to hit you directly in the face while showering until told by your health care provider. Keep soap and shampoo out of your eyes.  Do not put a contact lens  into the affected eye or eyes until your health care provider approves.  Keep all follow-up visits as told by your health care provider. This is important. Contact a health care provider if:   You have increased bruising around your eye.  You have pain that is not helped with medicine.  You have a fever.  You have redness, swelling, or pain in your eye.  You have fluid, blood, or pus coming from your incision.  Your vision gets worse. Get help right away if:  You have sudden vision loss. This information is not intended to replace advice given to you by your health care provider. Make sure you discuss any questions you have with your health care provider. Document Released: 02/04/2005 Document Revised: 11/26/2015 Document Reviewed: 05/28/2015 Elsevier Interactive Patient Education  2017 [place] Anesthesia, Adult, Care After These instructions provide you with information about caring for yourself after your procedure. Your health care provider may also give you more specific instructions. Your treatment has been planned according to current medical practices, but problems sometimes occur. Call your health care provider if you have any problems or questions after your procedure. What can I expect after the procedure? After the procedure, it is common to have:  Vomiting.  A sore throat.  Mental slowness.  It is common to feel:  Nauseous.  Cold or shivery.  Sleepy.  Tired.  Sore or achy, even in parts of your body where you did not have surgery.  Follow these instructions at home: For  at least 24 hours after the procedure:  Do not: ? Participate in activities where you could fall or become injured. ? Drive. ? Use heavy machinery. ? Drink alcohol. ? Take sleeping pills or medicines that cause drowsiness. ? Make important decisions or sign legal documents. ? Take care of children on your own.  Rest. Eating and drinking  If you vomit, drink  water, juice, or soup when you can drink without vomiting.  Drink enough fluid to keep your urine clear or pale yellow.  Make sure you have little or no nausea before eating solid foods.  Follow the diet recommended by your health care provider. General instructions  Have a responsible adult stay with you until you are awake and alert.  Return to your normal activities as told by your health care provider. Ask your health care provider what activities are safe for you.  Take over-the-counter and prescription medicines only as told by your health care provider.  If you smoke, do not smoke without supervision.  Keep all follow-up visits as told by your health care provider. This is important. Contact a health care provider if:  You continue to have nausea or vomiting at home, and medicines are not helpful.  You cannot drink fluids or start eating again.  You cannot urinate after 8-12 hours.  You develop a skin rash.  You have fever.  You have increasing redness at the site of your procedure. Get help right away if:  You have difficulty breathing.  You have chest pain.  You have unexpected bleeding.  You feel that you are having a life-threatening or urgent problem. This information is not intended to replace advice given to you by your health care provider. Make sure you discuss any questions you have with your health care provider. Document Released: 10/24/2000 Document Revised: 12/21/2015 Document Reviewed: 07/02/2015 Elsevier Interactive Patient Education  [employer].

## 2017-02-08 ENCOUNTER — Encounter
Admission: RE | Disposition: A | Discharge status: Home / Self Care | Payer: Self-pay | Source: Ambulatory Visit | Attending: Ophthalmology

## 2017-02-08 ENCOUNTER — Ambulatory Visit: Payer: Medicare Other | Admitting: Anesthesiology

## 2017-02-08 ENCOUNTER — Ambulatory Visit
Admission: RE | Admit: 2017-02-08 | Discharge: 2017-02-08 | Disposition: A | Discharge status: Home / Self Care | Payer: Medicare Other | Source: Ambulatory Visit | Attending: Ophthalmology | Admitting: Ophthalmology

## 2017-02-08 DIAGNOSIS — Z95810 Presence of automatic (implantable) cardiac defibrillator: Secondary | ICD-10-CM | POA: Diagnosis not present

## 2017-02-08 DIAGNOSIS — Z7982 Long term (current) use of aspirin: Secondary | ICD-10-CM | POA: Insufficient documentation

## 2017-02-08 DIAGNOSIS — I252 Old myocardial infarction: Secondary | ICD-10-CM | POA: Diagnosis not present

## 2017-02-08 DIAGNOSIS — Z79899 Other long term (current) drug therapy: Secondary | ICD-10-CM | POA: Insufficient documentation

## 2017-02-08 DIAGNOSIS — H2512 Age-related nuclear cataract, left eye: Secondary | ICD-10-CM | POA: Diagnosis not present

## 2017-02-08 DIAGNOSIS — I110 Hypertensive heart disease with heart failure: Secondary | ICD-10-CM | POA: Diagnosis not present

## 2017-02-08 DIAGNOSIS — Z7984 Long term (current) use of oral hypoglycemic drugs: Secondary | ICD-10-CM | POA: Diagnosis not present

## 2017-02-08 DIAGNOSIS — G4730 Sleep apnea, unspecified: Secondary | ICD-10-CM | POA: Diagnosis not present

## 2017-02-08 DIAGNOSIS — I2510 Atherosclerotic heart disease of native coronary artery without angina pectoris: Secondary | ICD-10-CM | POA: Diagnosis not present

## 2017-02-08 DIAGNOSIS — K219 Gastro-esophageal reflux disease without esophagitis: Secondary | ICD-10-CM | POA: Insufficient documentation

## 2017-02-08 DIAGNOSIS — I480 Paroxysmal atrial fibrillation: Secondary | ICD-10-CM | POA: Diagnosis not present

## 2017-02-08 DIAGNOSIS — Z7902 Long term (current) use of antithrombotics/antiplatelets: Secondary | ICD-10-CM | POA: Insufficient documentation

## 2017-02-08 DIAGNOSIS — E7800 Pure hypercholesterolemia, unspecified: Secondary | ICD-10-CM | POA: Diagnosis not present

## 2017-02-08 DIAGNOSIS — E119 Type 2 diabetes mellitus without complications: Secondary | ICD-10-CM | POA: Insufficient documentation

## 2017-02-08 DIAGNOSIS — I509 Heart failure, unspecified: Secondary | ICD-10-CM | POA: Insufficient documentation

## 2017-02-08 HISTORY — PX: CATARACT EXTRACTION W/PHACO: SHX586

## 2017-02-08 LAB — GLUCOSE, CAPILLARY
Glucose-Capillary: 120 mg/dL — ABNORMAL HIGH (ref 65–99)
Glucose-Capillary: 120 mg/dL — ABNORMAL HIGH (ref 65–99)

## 2017-02-08 SURGERY — PHACOEMULSIFICATION, CATARACT, WITH IOL INSERTION
Anesthesia: Monitor Anesthesia Care | Laterality: Left | Wound class: Clean

## 2017-02-08 MED ORDER — ACETAMINOPHEN 160 MG/5ML PO SOLN: 325.0000 mg | ORAL | Status: DC | PRN

## 2017-02-08 MED ORDER — FENTANYL CITRATE (PF) 100 MCG/2ML IJ SOLN
INTRAMUSCULAR | Status: DC | PRN
  Administered 2017-02-08 (×2): 50 ug via INTRAVENOUS

## 2017-02-08 MED ORDER — ARMC OPHTHALMIC DILATING DROPS
1.0000 "application " | OPHTHALMIC | Status: DC | PRN
  Administered 2017-02-08 (×3): 1 via OPHTHALMIC

## 2017-02-08 MED ORDER — LACTATED RINGERS IV SOLN: INTRAVENOUS | Status: DC

## 2017-02-08 MED ORDER — CEFUROXIME OPHTHALMIC INJECTION 1 MG/0.1 ML
INJECTION | OPHTHALMIC | Status: DC | PRN
  Administered 2017-02-08: 0.1 mL via INTRACAMERAL

## 2017-02-08 MED ORDER — NA HYALUR & NA CHOND-NA HYALUR 0.4-0.35 ML IO KIT
PACK | INTRAOCULAR | Status: DC | PRN
  Administered 2017-02-08: 1 mL via INTRAOCULAR

## 2017-02-08 MED ORDER — ACETAMINOPHEN 325 MG PO TABS: 325.0000 mg | ORAL_TABLET | ORAL | Status: DC | PRN

## 2017-02-08 MED ORDER — MOXIFLOXACIN HCL 0.5 % OP SOLN
1.0000 [drp] | OPHTHALMIC | Status: DC | PRN
  Administered 2017-02-08 (×3): 1 [drp] via OPHTHALMIC

## 2017-02-08 MED ORDER — EPINEPHRINE PF 1 MG/ML IJ SOLN
INTRAOCULAR | Status: DC | PRN
  Administered 2017-02-08: 08:00:00 via OPHTHALMIC
  Administered 2017-02-08: 65 mL via OPHTHALMIC

## 2017-02-08 MED ORDER — MIDAZOLAM HCL 2 MG/2ML IJ SOLN
INTRAMUSCULAR | Status: DC | PRN
  Administered 2017-02-08: 2 mg via INTRAVENOUS

## 2017-02-08 MED ORDER — BRIMONIDINE TARTRATE-TIMOLOL 0.2-0.5 % OP SOLN
OPHTHALMIC | Status: DC | PRN
  Administered 2017-02-08: 1 [drp] via OPHTHALMIC

## 2017-02-08 SURGICAL SUPPLY — 25 items
CANNULA ANT/CHMB 27GA (MISCELLANEOUS) ×3 IMPLANT
CARTRIDGE ABBOTT (MISCELLANEOUS) IMPLANT
GLOVE SURG LX 7.5 STRW (GLOVE) ×2
GLOVE SURG LX STRL 7.5 STRW (GLOVE) ×1 IMPLANT
GLOVE SURG TRIUMPH 8.0 PF LTX (GLOVE) ×3 IMPLANT
GOWN STRL REUS W/ TWL LRG LVL3 (GOWN DISPOSABLE) ×2 IMPLANT
GOWN STRL REUS W/TWL LRG LVL3 (GOWN DISPOSABLE) ×4
LENS IOL TECNIS ITEC 19.5 (Intraocular Lens) ×3 IMPLANT
MARKER SKIN DUAL TIP RULER LAB (MISCELLANEOUS) ×3 IMPLANT
NDL RETROBULBAR .5 NSTRL (NEEDLE) IMPLANT
NEEDLE FILTER BLUNT 18X 1/2SAF (NEEDLE) ×2
NEEDLE FILTER BLUNT 18X1 1/2 (NEEDLE) ×1 IMPLANT
PACK CATARACT BRASINGTON (MISCELLANEOUS) ×3 IMPLANT
PACK EYE AFTER SURG (MISCELLANEOUS) ×3 IMPLANT
PACK OPTHALMIC (MISCELLANEOUS) ×3 IMPLANT
RING MALYGIN 7.0 (MISCELLANEOUS) IMPLANT
SUT ETHILON 10-0 CS-B-6CS-B-6 (SUTURE)
SUT VICRYL  9 0 (SUTURE)
SUT VICRYL 9 0 (SUTURE) IMPLANT
SUTURE EHLN 10-0 CS-B-6CS-B-6 (SUTURE) IMPLANT
SYR 3ML LL SCALE MARK (SYRINGE) ×3 IMPLANT
SYR 5ML LL (SYRINGE) ×3 IMPLANT
SYR TB 1ML LUER SLIP (SYRINGE) ×3 IMPLANT
WATER STERILE IRR 250ML POUR (IV SOLUTION) ×3 IMPLANT
WIPE NON LINTING 3.25X3.25 (MISCELLANEOUS) ×3 IMPLANT

## 2017-02-08 NOTE — Anesthesia Procedure Notes (Signed)
Procedure Name: MAC Performed by: Nekhi Liwanag Pre-anesthesia Checklist: Patient identified, Emergency Drugs available, Suction available, Timeout performed and Patient being monitored Patient Re-evaluated:Patient Re-evaluated prior to inductionOxygen Delivery Method: Nasal cannula Placement Confirmation: positive ETCO2     

## 2017-02-08 NOTE — Anesthesia Preprocedure Evaluation (Signed)
Anesthesia Evaluation  Patient identified by MRN, date of birth, ID band Patient awake    Reviewed: Allergy & Precautions, H&P , NPO status   History of Anesthesia Complications Negative for: history of anesthetic complications  Airway Mallampati: I  TM Distance: >3 FB Neck ROM: Full    Dental  (+) Missing Missing many teeth; none loose or chipped per pt:   Pulmonary sleep apnea ,    breath sounds clear to auscultation       Cardiovascular Exercise Tolerance: Good hypertension, + CAD, + Past MI and +CHF (ischemic cardiomyopathy)  + dysrhythmias (paroxysmal afib) + Cardiac Defibrillator  Rhythm:regular Rate:Normal  Last cardiology visit 4/3  TTE 2015 - EF 30-35%   Neuro/Psych negative neurological ROS  negative psych ROS   GI/Hepatic GERD  ,  Endo/Other  diabetes, Type 2  Renal/GU Renal disease     Musculoskeletal negative musculoskeletal ROS (+)   Abdominal   Peds  Hematology negative hematology ROS (+)   Anesthesia Other Findings   Reproductive/Obstetrics                             Anesthesia Physical  Anesthesia Plan  ASA: III  Anesthesia Plan: MAC   Post-op Pain Management:    Induction:   PONV Risk Score and Plan: 1  Airway Management Planned:   Additional Equipment:   Intra-op Plan:   Post-operative Plan:   Informed Consent: I have reviewed the patients History and Physical, chart, labs and discussed the procedure including the risks, benefits and alternatives for the proposed anesthesia with the patient or authorized representative who has indicated his/her understanding and acceptance.     Plan Discussed with: CRNA  Anesthesia Plan Comments:         Anesthesia Quick Evaluation

## 2017-02-08 NOTE — H&P (Signed)
The History and Physical notes are on paper, have been signed, and are to be scanned. The patient remains stable and unchanged from the H&P.   Previous H&P reviewed, patient examined, and there are no changes.  [name] 02/08/2017 7:38 AM

## 2017-02-08 NOTE — Op Note (Signed)
OPERATIVE NOTE  Colton Lamb [id number] 02/08/2017   PREOPERATIVE DIAGNOSIS:  Nuclear sclerotic cataract left eye. H25.12   POSTOPERATIVE DIAGNOSIS:    Nuclear sclerotic cataract left eye.     PROCEDURE:  Phacoemusification with posterior chamber intraocular lens placement of the left eye   LENS:   Implant Name Type Inv. Item Serial No. Manufacturer Lot No. LRB No. Used  LENS IOL DIOP 19.5 - [id number] Intraocular Lens LENS IOL DIOP 19.5 [id number] AMO   Left 1        ULTRASOUND TIME: 21  % of 1 minutes 6 seconds, CDE 13.8  SURGEON:  Wyonia Hough, MD   ANESTHESIA:  Topical with tetracaine drops and 2% Xylocaine jelly, augmented with 1% preservative-free intracameral lidocaine.    COMPLICATIONS:  None.   DESCRIPTION OF PROCEDURE:  The patient was identified in the holding room and transported to the operating room and placed in the supine position under the operating microscope.  The left eye was identified as the operative eye and it was prepped and draped in the usual sterile ophthalmic fashion.   A 1 millimeter clear-corneal paracentesis was made at the 1:30 position.  0.5 ml of preservative-free 1% lidocaine was injected into the anterior chamber.  The anterior chamber was filled with Viscoat viscoelastic.  A 2.4 millimeter keratome was used to make a near-clear corneal incision at the 10:30 position.  .  A curvilinear capsulorrhexis was made with a cystotome and capsulorrhexis forceps.  Balanced salt solution was used to hydrodissect and hydrodelineate the nucleus.   Phacoemulsification was then used in stop and chop fashion to remove the lens nucleus and epinucleus.  The remaining cortex was then removed using the irrigation and aspiration handpiece. Provisc was then placed into the capsular bag to distend it for lens placement.  A lens was then injected into the capsular bag.  The remaining viscoelastic was aspirated.   Wounds were hydrated with balanced salt  solution.  The anterior chamber was inflated to a physiologic pressure with balanced salt solution.  No wound leaks were noted. Cefuroxime 0.1 ml of a 25m/ml solution was injected into the anterior chamber for a dose of 1 mg of intracameral antibiotic at the completion of the case.   Timolol and Brimonidine drops were applied to the eye.  The patient was taken to the recovery room in stable condition without complications of anesthesia or surgery.  Colton Lamb 02/08/2017, 8:00 AM

## 2017-02-08 NOTE — Anesthesia Postprocedure Evaluation (Signed)
Anesthesia Post Note  Patient: Colton Lamb  Procedure(s) Performed: Procedure(s) (LRB): CATARACT EXTRACTION PHACO AND INTRAOCULAR LENS PLACEMENT (IOC) left diabetic (Left)  Patient location during evaluation: PACU Anesthesia Type: MAC Level of consciousness: awake and alert, oriented and patient cooperative Pain management: pain level controlled Vital Signs Assessment: post-procedure vital signs reviewed and stable Respiratory status: spontaneous breathing, nonlabored ventilation and respiratory function stable Cardiovascular status: blood pressure returned to baseline and stable Postop Assessment: adequate PO intake Anesthetic complications: no    Darrin Nipper

## 2017-02-08 NOTE — Transfer of Care (Signed)
Immediate Anesthesia Transfer of Care Note  Patient: Colton Lamb  Procedure(s) Performed: Procedure(s) with comments: CATARACT EXTRACTION PHACO AND INTRAOCULAR LENS PLACEMENT (IOC) left diabetic (Left) - Diabetic - oral meds sleep apnea  Patient Location: PACU  Anesthesia Type: MAC  Level of Consciousness: awake, alert  and patient cooperative  Airway and Oxygen Therapy: Patient Spontanous Breathing and Patient connected to supplemental oxygen  Post-op Assessment: Post-op Vital signs reviewed, Patient's Cardiovascular Status Stable, Respiratory Function Stable, Patent Airway and No signs of Nausea or vomiting  Post-op Vital Signs: Reviewed and stable  Complications: No apparent anesthesia complications

## 2017-02-09 ENCOUNTER — Other Ambulatory Visit: Payer: Self-pay | Admitting: Cardiovascular Disease

## 2017-04-12 ENCOUNTER — Other Ambulatory Visit: Payer: Self-pay | Admitting: Cardiovascular Disease

## 2017-04-16 ENCOUNTER — Other Ambulatory Visit: Payer: Self-pay | Admitting: Cardiovascular Disease

## 2017-04-25 ENCOUNTER — Ambulatory Visit (INDEPENDENT_AMBULATORY_CARE_PROVIDER_SITE_OTHER): Payer: Medicare Other | Admitting: *Deleted

## 2017-04-25 DIAGNOSIS — I255 Ischemic cardiomyopathy: Secondary | ICD-10-CM | POA: Diagnosis not present

## 2017-04-25 NOTE — Progress Notes (Signed)
Remote ICD transmission.   

## 2017-04-27 ENCOUNTER — Encounter: Payer: Self-pay | Admitting: Cardiology

## 2017-04-27 DIAGNOSIS — I2510 Atherosclerotic heart disease of native coronary artery without angina pectoris: Secondary | ICD-10-CM | POA: Insufficient documentation

## 2017-04-27 DIAGNOSIS — I25118 Atherosclerotic heart disease of native coronary artery with other forms of angina pectoris: Secondary | ICD-10-CM | POA: Insufficient documentation

## 2017-04-27 LAB — CUP PACEART REMOTE DEVICE CHECK
Brady Statistic AP VP Percent: 0 %
Brady Statistic AP VS Percent: 0.31 %
Brady Statistic AS VP Percent: 0.04 %
Brady Statistic RV Percent Paced: 0.04 %
HighPow Impedance: 67 Ohm
Implantable Lead Location: 753859
Implantable Lead Model: 5076
Implantable Lead Model: 6935
Lead Channel Pacing Threshold Amplitude: 1.125 V
Lead Channel Sensing Intrinsic Amplitude: 1.875 mV
Lead Channel Sensing Intrinsic Amplitude: 1.875 mV
Lead Channel Sensing Intrinsic Amplitude: 4.75 mV
Lead Channel Sensing Intrinsic Amplitude: 4.75 mV
Lead Channel Setting Pacing Amplitude: 2.25 V
Lead Channel Setting Pacing Pulse Width: 0.4 ms
MDC IDC LEAD IMPLANT DT: 20150916
MDC IDC LEAD IMPLANT DT: 20150916
MDC IDC LEAD LOCATION: 753860
MDC IDC MSMT BATTERY REMAINING LONGEVITY: 101 mo
MDC IDC MSMT BATTERY VOLTAGE: 2.96 V
MDC IDC MSMT LEADCHNL RA IMPEDANCE VALUE: 399 Ohm
MDC IDC MSMT LEADCHNL RA PACING THRESHOLD AMPLITUDE: 0.75 V
MDC IDC MSMT LEADCHNL RA PACING THRESHOLD PULSEWIDTH: 0.4 ms
MDC IDC MSMT LEADCHNL RV IMPEDANCE VALUE: 266 Ohm
MDC IDC MSMT LEADCHNL RV IMPEDANCE VALUE: 323 Ohm
MDC IDC MSMT LEADCHNL RV PACING THRESHOLD PULSEWIDTH: 0.4 ms
MDC IDC PG IMPLANT DT: 20150916
MDC IDC SESS DTM: 20180925062724
MDC IDC SET LEADCHNL RA PACING AMPLITUDE: 1.5 V
MDC IDC SET LEADCHNL RV SENSING SENSITIVITY: 0.3 mV
MDC IDC STAT BRADY AS VS PERCENT: 99.65 %
MDC IDC STAT BRADY RA PERCENT PACED: 0.31 %

## 2017-04-27 NOTE — Progress Notes (Signed)
Cardiology Office Note  Date:  04/28/2017   ID:  Colton Lamb, DOB [birth year], MRN [id number]  PCP:  Sherrin Daisy, MD   Chief Complaint  Patient presents with  . other    6 month f/u no complaints today. Meds reviewed verbally with pt.    HPI:   Colton Lamb is a 66 year old gentleman with a history of  paroxysmal atrial fibrillation,   hyperlipidemia,  Crohn's ileocolitis,  coronary artery disease, PCI x3 to the LAD, circumflex, PDA and PL branch, catheterization in 2008  with a cypher stent to the OM,  Ejection fraction 30-35% in July 2015 by echo sleep apnea, has not been using CPAP poorly controlled diabetes, little follow-up with his primary care S/p catheter ablation of atrial flutter and insertion of a DDD ICD. no syncope or ICD shock. He presents today for follow-up of his coronary artery disease, atrial fibrillation  Eats out some No complaints HBA1C 6.5, down from HBA1C 15 Total chol 106, LDL 58  No significant shortness of breath on exertion No significant leg edema  EKG personally reviewed by myself on today's visit shows normal sinus rhythm with rate 69 bpm, old anterior MI  Other past medical history reviewed  in the ER in May 2017, stomach bug, N/V, diarrhea Potassium noted to be mildly low at that time  February had episodes of Vertigo, dry heaves Multiple epsiodes Admitted to hospital 09/04/2016 N/V Potassium 2.8 Improved up to 3.9 on recheck  Admitted in April 2015 with atrial flutter, non-ST elevation MI, with cardioversion, cardiac catheterization showing occluded LAD,  moderate distal RCA disease, patent left circumflex.. Medical management recommended.  Severely depressed ejection fraction noted, LifeVest was placed at that time. Ejection fraction 20-25%   hospital admission May 13 with discharge 12/14/2013 with dig toxicity, junctional rhythm, bradycardia with heart rates in the 30s, syncope.  Acute renal failure likely secondary to  ATN. Also noted to have TSH of 10. ACE inhibitor, spironolactone, isosorbide was held. Renal function improved at the time of discharge  Followup echocardiogram 01/29/2014 showing improved ejection fraction up to 30-35%   PMH:   has a past medical history of Atrial flutter ([place]); CHF (congestive heart failure) ([place]); Chronic systolic heart failure ([place]); Coronary artery disease; Crohn's ileocolitis ([place]); GERD (gastroesophageal reflux disease); adenomatous colonic polyps (11/2003); Hyperlipidemia; Hypertension; Ischemic cardiomyopathy; Obesity; Paroxysmal atrial fibrillation ([place]); Sleep apnea; Syncope; and Type II diabetes mellitus ([place]).  PSH:    Past Surgical History:  Procedure Laterality Date  . ATRIAL FLUTTER ABLATION N/A 04/16/2014   Procedure: ATRIAL FLUTTER ABLATION;  Surgeon: Evans Lance, MD;  Location: [facility] CATH LAB;  Service: Cardiovascular;  Laterality: N/A;  . CARDIAC CATHETERIZATION  10/2013  . CARDIAC DEFIBRILLATOR PLACEMENT  04/16/2014   Medtronic Evira device  . CARDIAC ELECTROPHYSIOLOGY STUDY AND ABLATION  04/16/2014   atrial flutter ablation  . CARDIOVERSION N/A 03/05/2014   Procedure: CARDIOVERSION;  Surgeon: Jolaine Artist, MD;  Location: [facility] ENDOSCOPY;  Service: Cardiovascular;  Laterality: N/A;  . CATARACT EXTRACTION W/PHACO Right 01/04/2017   Procedure: CATARACT EXTRACTION PHACO AND INTRAOCULAR LENS PLACEMENT ([place])  Right Diabetic Complicated;  Surgeon: Leandrew Koyanagi, MD;  Location: [place];  Service: Ophthalmology;  Laterality: Right;  Diabetic  . CATARACT EXTRACTION W/PHACO Left 02/08/2017   Procedure: CATARACT EXTRACTION PHACO AND INTRAOCULAR LENS PLACEMENT ([name]) left diabetic;  Surgeon: Leandrew Koyanagi, MD;  Location: [place];  Service: Ophthalmology;  Laterality: Left;  Diabetic - oral meds sleep apnea  . CORONARY  ANGIOPLASTY WITH STENT PLACEMENT  2007; 2008 X 2   "1+1 ~ 1"  . FOOT SURGERY Left    bone spur  . HYDROCELE  EXCISION Bilateral   . 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 IMPLANTABLE CARDIOVERTER DEFIBRILLATOR IMPLANT N/A 04/16/2014   Procedure: IMPLANTABLE CARDIOVERTER DEFIBRILLATOR IMPLANT;  Surgeon: Evans Lance, MD;  Location: [facility] CATH LAB;  Service: Cardiovascular;  Laterality: N/A;  . LEFT HEART CATHETERIZATION WITH CORONARY ANGIOGRAM N/A 11/22/2013   Procedure: LEFT HEART CATHETERIZATION WITH CORONARY ANGIOGRAM;  Surgeon: Sinclair Grooms, MD;  Location: [facility] CATH LAB;  Service: Cardiovascular;  Laterality: N/A;    Current Outpatient Prescriptions  Medication Sig Dispense Refill  . acetaminophen (TYLENOL) 325 MG tablet Take 1-2 tablets (325-650 mg total) by mouth every 4 (four) hours as needed for mild pain.    Marland Kitchen amiodarone (PACERONE) 200 MG tablet TAKE 1 TABLET BY MOUTH MON.-FRIDAY AND 1/2 TABLET BY MOUTH SATURDAY AND SUNDAY. 30 tablet 7  . aspirin 81 MG tablet Take 81 mg by mouth daily.     Marland Kitchen atorvastatin (LIPITOR) 80 MG tablet TAKE 1 TABLET (80 MG TOTAL) BY MOUTH DAILY. 30 tablet 0  . balsalazide (COLAZAL) 750 MG capsule TAKE 1 CAPSULE (750 MG TOTAL) BY MOUTH 3 (THREE) TIMES DAILY. 90 capsule 11  . carvedilol (COREG) 12.5 MG tablet TAKE 1 TABLET (12.5 MG TOTAL) BY MOUTH 2 (TWO) TIMES DAILY. 60 tablet 3  . ENTRESTO 49-51 MG TAKE 1 TABLET BY MOUTH 2 (TWO) TIMES DAILY. 60 tablet 3  . meclizine (ANTIVERT) 25 MG tablet Take 1 tablet (25 mg total) by mouth 3 (three) times daily as needed. 60 tablet 1  . metFORMIN (GLUCOPHAGE) 1000 MG tablet Take 1,000 mg by mouth 2 (two) times daily with a meal.     . niacin (NIASPAN) 1000 MG CR tablet TAKE 1 TABLET BY MOUTH AT BEDTIME 30 tablet 3  . nitroGLYCERIN (NITROSTAT) 0.4 MG SL tablet Place 0.4 mg under the tongue every 5 (five) minutes as needed (MAX 3 TABLETS). FOR CHEST PAIN    . omeprazole (PRILOSEC) 20 MG capsule Take 20 mg by mouth daily.     . ondansetron (ZOFRAN) 4 MG tablet Take 1 tablet (4 mg total) by mouth every 8  (eight) hours as needed for nausea or vomiting. 21 tablet 0  . potassium chloride (K-DUR) 10 MEQ tablet Take 1 tablet (10 mEq total) by mouth daily. 90 tablet 3  . torsemide (DEMADEX) 20 MG tablet Take 1 tablet (20 mg total) by mouth 2 (two) times daily. 180 tablet 3  . XARELTO 20 MG TABS tablet TAKE 1 TABLET BY MOUTH EVERY DAY WITH LUNCH 30 tablet 3   No current facility-administered medications for this visit.      Allergies:   Iodine; Shrimp [shellfish allergy]; and Tetracycline   Social History:  The patient  reports that he has never smoked. He has never used smokeless tobacco. He reports that he does not drink alcohol or use drugs.   Family History:   family history includes Breast cancer in his mother; Heart attack in his father; Heart disease in his father.    Review of Systems: Review of Systems  Constitutional: Negative.   Respiratory: Negative.   Cardiovascular: Negative.   Gastrointestinal: Negative.   Musculoskeletal: Negative.   Neurological: Negative.   Psychiatric/Behavioral: Negative.   All other systems reviewed and are negative. No changes compared to previous visit   PHYSICAL EXAM: VS:  BP 108/74 (BP Location: Left Arm, Patient Position: Sitting, Cuff Size: Large)   Pulse 69   Ht 5' 11"  (1.803 m)   Wt 268 lb 12 oz (121.9 kg)   BMI 37.48 kg/m  , BMI Body mass index is 37.48 kg/m. No changes compared to previous office visit GEN: Well nourished, well developed, in no acute distress, obese  HEENT: normal  Neck: no JVD, carotid bruits, or masses Cardiac: RRR; no murmurs, rubs, or gallops,no edema  Respiratory:  clear to auscultation bilaterally, normal work of breathing GI: soft, nontender, nondistended, + BS MS: no deformity or atrophy  Skin: warm and dry, no rash Neuro:  Strength and sensation are intact Psych: euthymic mood, full affect    Recent Labs: 09/06/2016: ALT 15 09/07/2016: TSH 3.060 09/09/2016: BUN 15; Creatinine, Ser 0.97; Hemoglobin 12.2;  Magnesium 1.9; Platelets 238; Potassium 3.7; Sodium 137    Lipid Panel Lab Results  Component Value Date   CHOL 112 05/09/2016   HDL 35 (L) 05/09/2016   LDLCALC 57 05/09/2016   TRIG 100 05/09/2016      Wt Readings from Last 3 Encounters:  04/28/17 268 lb 12 oz (121.9 kg)  02/08/17 268 lb (121.6 kg)  01/04/17 264 lb (119.7 kg)       ASSESSMENT AND PLAN:   Paroxysmal atrial fibrillation (HCC) - Maintaining normal sinus rhythm, on amiodarone, anticoagulation Denies any symptoms concerning for paroxysmal episodes  Cardiomyopathy, ischemic - Previous ejection fraction 35% He does not want repeat echocardiogram at this time continue carvedilol, torsemide, entresto.   Chronic systolic heart failure (HCC) -above, reports he is stable  No changes to his medications , BMP stable  Hyperlipidemia, unspecified hyperlipidemia type - We have ordered repeat lab work,Goal LDL less than 70   Type 2 diabetes mellitus without complication, with long-term current use of insulin ([place]) - Plan: HgB A1c dramatically improved Weight is stable Still eats out but doing better  Vertigo Take meclizine as needed Denies any recent symptoms    Total encounter time more than 25 minutes  Greater than 50% was spent in counseling and coordination of care with the patient   Disposition:   F/U  12 months   Orders Placed This Encounter  Procedures  . EKG 12-Lead     Signed, Esmond Plants, M.D., Ph.D. 04/28/2017  [place], [place]

## 2017-04-28 ENCOUNTER — Encounter: Payer: Self-pay | Admitting: Cardiovascular Disease

## 2017-04-28 ENCOUNTER — Ambulatory Visit (INDEPENDENT_AMBULATORY_CARE_PROVIDER_SITE_OTHER): Payer: Medicare Other | Admitting: Cardiovascular Disease

## 2017-04-28 VITALS — BP 108/74 | HR 69 | Ht 71.0 in | Wt 268.8 lb

## 2017-04-28 DIAGNOSIS — Z23 Encounter for immunization: Secondary | ICD-10-CM | POA: Diagnosis not present

## 2017-04-28 DIAGNOSIS — IMO0002 Reserved for concepts with insufficient information to code with codable children: Secondary | ICD-10-CM

## 2017-04-28 DIAGNOSIS — Z95810 Presence of automatic (implantable) cardiac defibrillator: Secondary | ICD-10-CM | POA: Diagnosis not present

## 2017-04-28 DIAGNOSIS — I209 Angina pectoris, unspecified: Secondary | ICD-10-CM

## 2017-04-28 DIAGNOSIS — E1165 Type 2 diabetes mellitus with hyperglycemia: Secondary | ICD-10-CM

## 2017-04-28 DIAGNOSIS — E118 Type 2 diabetes mellitus with unspecified complications: Secondary | ICD-10-CM | POA: Diagnosis not present

## 2017-04-28 DIAGNOSIS — I25118 Atherosclerotic heart disease of native coronary artery with other forms of angina pectoris: Secondary | ICD-10-CM

## 2017-04-28 DIAGNOSIS — E1159 Type 2 diabetes mellitus with other circulatory complications: Secondary | ICD-10-CM

## 2017-04-28 DIAGNOSIS — R600 Localized edema: Secondary | ICD-10-CM

## 2017-04-28 DIAGNOSIS — I255 Ischemic cardiomyopathy: Secondary | ICD-10-CM

## 2017-04-28 DIAGNOSIS — I480 Paroxysmal atrial fibrillation: Secondary | ICD-10-CM | POA: Diagnosis not present

## 2017-04-28 NOTE — Patient Instructions (Signed)

## 2017-05-02 ENCOUNTER — Ambulatory Visit (INDEPENDENT_AMBULATORY_CARE_PROVIDER_SITE_OTHER): Payer: Medicare Other | Admitting: Internal Medicine

## 2017-05-02 ENCOUNTER — Encounter: Payer: Self-pay | Admitting: Internal Medicine

## 2017-05-02 VITALS — BP 100/60 | HR 76 | Ht 71.0 in | Wt 272.6 lb

## 2017-05-02 DIAGNOSIS — I5022 Chronic systolic (congestive) heart failure: Secondary | ICD-10-CM

## 2017-05-02 DIAGNOSIS — I2589 Other forms of chronic ischemic heart disease: Secondary | ICD-10-CM

## 2017-05-02 DIAGNOSIS — I480 Paroxysmal atrial fibrillation: Secondary | ICD-10-CM | POA: Diagnosis not present

## 2017-05-02 DIAGNOSIS — I255 Ischemic cardiomyopathy: Secondary | ICD-10-CM | POA: Diagnosis not present

## 2017-05-02 DIAGNOSIS — Z95810 Presence of automatic (implantable) cardiac defibrillator: Secondary | ICD-10-CM | POA: Diagnosis not present

## 2017-05-02 NOTE — Patient Instructions (Addendum)
Medication Instructions:  Your physician recommends that you continue on your current medications as directed. Please refer to the Current Medication list given to you today.  Labwork: None ordered.  Testing/Procedures: None ordered.  Follow-Up: Your physician wants you to follow-up in: one year with Dr. Lovena Le.   You will receive a reminder letter in the mail two months in advance. If you don't receive a letter, please call our office to schedule the follow-up appointment.  Remote monitoring is used to monitor your ICD from home. This monitoring reduces the number of office visits required to check your device to one time per year. It allows [place] to keep an eye on the functioning of your device to ensure it is working properly. You are scheduled for a device check from home on 07/27/2017. You may send your transmission at any time that day. If you have a wireless device, the transmission will be sent automatically. After your physician reviews your transmission, you will receive a postcard with your next transmission date.    Any Other Special Instructions Will Be Listed Below (If Applicable).   Calorie Counting for Weight Loss Calories are units of energy. Your body needs a certain amount of calories from food to keep you going throughout the day. When you eat more calories than your body needs, your body stores the extra calories as fat. When you eat fewer calories than your body needs, your body burns fat to get the energy it needs. Calorie counting means keeping track of how many calories you eat and drink each day. Calorie counting can be helpful if you need to lose weight. If you make sure to eat fewer calories than your body needs, you should lose weight. Ask your health care provider what a healthy weight is for you. For calorie counting to work, you will need to eat the right number of calories in a day in order to lose a healthy amount of weight per week. A dietitian can help you  determine how many calories you need in a day and will give you suggestions on how to reach your calorie goal.  A healthy amount of weight to lose per week is usually 1-2 lb (0.5-0.9 kg). This usually means that your daily calorie intake should be reduced by 500-750 calories.  Eating 1,200 - 1,500 calories per day can help most women lose weight.  Eating 1,500 - 1,800 calories per day can help most men lose weight.  What is my plan? My goal is to have __________ calories per day. If I have this many calories per day, I should lose around __________ pounds per week. What do I need to know about calorie counting? In order to meet your daily calorie goal, you will need to:  Find out how many calories are in each food you would like to eat. Try to do this before you eat.  Decide how much of the food you plan to eat.  Write down what you ate and how many calories it had. Doing this is called keeping a food log.  To successfully lose weight, it is important to balance calorie counting with a healthy lifestyle that includes regular activity. Aim for 150 minutes of moderate exercise (such as walking) or 75 minutes of vigorous exercise (such as running) each week. Where do I find calorie information?  The number of calories in a food can be found on a Nutrition Facts label. If a food does not have a Nutrition Facts label, try to  look up the calories online or ask your dietitian for help. Remember that calories are listed per serving. If you choose to have more than one serving of a food, you will have to multiply the calories per serving by the amount of servings you plan to eat. For example, the label on a package of bread might say that a serving size is 1 slice and that there are 90 calories in a serving. If you eat 1 slice, you will have eaten 90 calories. If you eat 2 slices, you will have eaten 180 calories. How do I keep a food log? Immediately after each meal, record the following  information in your food log:  What you ate. Don't forget to include toppings, sauces, and other extras on the food.  How much you ate. This can be measured in cups, ounces, or number of items.  How many calories each food and drink had.  The total number of calories in the meal.  Keep your food log near you, such as in a small notebook in your pocket, or use a mobile app or website. Some programs will calculate calories for you and show you how many calories you have left for the day to meet your goal. What are some calorie counting tips?  Use your calories on foods and drinks that will fill you up and not leave you hungry: ? Some examples of foods that fill you up are nuts and nut butters, vegetables, lean proteins, and high-fiber foods like whole grains. High-fiber foods are foods with more than 5 g fiber per serving. ? Drinks such as sodas, specialty coffee drinks, alcohol, and juices have a lot of calories, yet do not fill you up.  Eat nutritious foods and avoid empty calories. Empty calories are calories you get from foods or beverages that do not have many vitamins or protein, such as candy, sweets, and soda. It is better to have a nutritious high-calorie food (such as an avocado) than a food with few nutrients (such as a bag of chips).  Know how many calories are in the foods you eat most often. This will help you calculate calorie counts faster.  Pay attention to calories in drinks. Low-calorie drinks include water and unsweetened drinks.  Pay attention to nutrition labels for "low fat" or "fat free" foods. These foods sometimes have the same amount of calories or more calories than the full fat versions. They also often have added sugar, starch, or salt, to make up for flavor that was removed with the fat.  Find a way of tracking calories that works for you. Get creative. Try different apps or programs if writing down calories does not work for you. What are some portion control  tips?  Know how many calories are in a serving. This will help you know how many servings of a certain food you can have.  Use a measuring cup to measure serving sizes. You could also try weighing out portions on a kitchen scale. With time, you will be able to estimate serving sizes for some foods.  Take some time to put servings of different foods on your favorite plates, bowls, and cups so you know what a serving looks like.  Try not to eat straight from a bag or box. Doing this can lead to overeating. Put the amount you would like to eat in a cup or on a plate to make sure you are eating the right portion.  Use smaller plates, glasses, and bowls  to prevent overeating.  Try not to multitask (for example, watch TV or use your computer) while eating. If it is time to eat, sit down at a table and enjoy your food. This will help you to know when you are full. It will also help you to be aware of what you are eating and how much you are eating. What are tips for following this plan? Reading food labels  Check the calorie count compared to the serving size. The serving size may be smaller than what you are used to eating.  Check the source of the calories. Make sure the food you are eating is high in vitamins and protein and low in saturated and trans fats. Shopping  Read nutrition labels while you shop. This will help you make healthy decisions before you decide to purchase your food.  Make a grocery list and stick to it. Cooking  Try to cook your favorite foods in a healthier way. For example, try baking instead of frying.  Use low-fat dairy products. Meal planning  Use more fruits and vegetables. Half of your plate should be fruits and vegetables.  Include lean proteins like poultry and fish. How do I count calories when eating out?  Ask for smaller portion sizes.  Consider sharing an entree and sides instead of getting your own entree.  If you get your own entree, eat only  half. Ask for a box at the beginning of your meal and put the rest of your entree in it so you are not tempted to eat it.  If calories are listed on the menu, choose the lower calorie options.  Choose dishes that include vegetables, fruits, whole grains, low-fat dairy products, and lean protein.  Choose items that are boiled, broiled, grilled, or steamed. Stay away from items that are buttered, battered, fried, or served with cream sauce. Items labeled "crispy" are usually fried, unless stated otherwise.  Choose water, low-fat milk, unsweetened iced tea, or other drinks without added sugar. If you want an alcoholic beverage, choose a lower calorie option such as a glass of wine or light beer.  Ask for dressings, sauces, and syrups on the side. These are usually high in calories, so you should limit the amount you eat.  If you want a salad, choose a garden salad and ask for grilled meats. Avoid extra toppings like bacon, cheese, or fried items. Ask for the dressing on the side, or ask for olive oil and vinegar or lemon to use as dressing.  Estimate how many servings of a food you are given. For example, a serving of cooked rice is  cup or about the size of half a baseball. Knowing serving sizes will help you be aware of how much food you are eating at restaurants. The list below tells you how big or small some common portion sizes are based on everyday objects: ? 1 oz-4 stacked dice. ? 3 oz-1 deck of cards. ? 1 tsp-1 die. ? 1 Tbsp- a ping-pong ball. ? 2 Tbsp-1 ping-pong ball. ?  cup- baseball. ? 1 cup-1 baseball. Summary  Calorie counting means keeping track of how many calories you eat and drink each day. If you eat fewer calories than your body needs, you should lose weight.  A healthy amount of weight to lose per week is usually 1-2 lb (0.5-0.9 kg). This usually means reducing your daily calorie intake by 500-750 calories.  The number of calories in a food can be found on a  Nutrition  Facts label. If a food does not have a Nutrition Facts label, try to look up the calories online or ask your dietitian for help.  Use your calories on foods and drinks that will fill you up, and not on foods and drinks that will leave you hungry.  Use smaller plates, glasses, and bowls to prevent overeating. This information is not intended to replace advice given to you by your health care provider. Make sure you discuss any questions you have with your health care provider. Document Released: 07/18/2005 Document Revised: 06/17/2016 Document Reviewed: 06/17/2016 Elsevier Interactive Patient Education  2017 [employer].    If you need a refill on your cardiac medications before your next appointment, please call your pharmacy.

## 2017-05-02 NOTE — Progress Notes (Signed)
HPI Colton Lamb returns today for ongoing evaluation and management of his chronic systolic heart failure, atrial flutter status post ablation, status post ICD implantation. The patient is a 66 year old man with the above problems who underwent ICD insertion just over 3 years ago. In the interim, he has done well except for an episode of dizziness back in January/February. He had gone out of rhythm for almost 2 hours before that. He denies non-compliance. No syncope or ICD shock. His heart failure symptoms are class 2. Allergies  Allergen Reactions  . Iodine Other (See Comments)    Shortness of breath, swelling and hives  . Shrimp [Shellfish Allergy] Other (See Comments)    SWELLING    HIVES    SHORTNESS OF BREATH  . Tetracycline Rash     Current Outpatient Prescriptions  Medication Sig Dispense Refill  . acetaminophen (TYLENOL) 325 MG tablet Take 1-2 tablets (325-650 mg total) by mouth every 4 (four) hours as needed for mild pain.    Marland Kitchen amiodarone (PACERONE) 200 MG tablet TAKE 1 TABLET BY MOUTH MON.-FRIDAY AND 1/2 TABLET BY MOUTH SATURDAY AND SUNDAY. 30 tablet 7  . aspirin 81 MG tablet Take 81 mg by mouth daily.     Marland Kitchen atorvastatin (LIPITOR) 80 MG tablet TAKE 1 TABLET (80 MG TOTAL) BY MOUTH DAILY. 30 tablet 0  . balsalazide (COLAZAL) 750 MG capsule TAKE 1 CAPSULE (750 MG TOTAL) BY MOUTH 3 (THREE) TIMES DAILY. 90 capsule 11  . carvedilol (COREG) 12.5 MG tablet TAKE 1 TABLET (12.5 MG TOTAL) BY MOUTH 2 (TWO) TIMES DAILY. 60 tablet 3  . ENTRESTO 49-51 MG TAKE 1 TABLET BY MOUTH 2 (TWO) TIMES DAILY. 60 tablet 3  . meclizine (ANTIVERT) 25 MG tablet Take 1 tablet (25 mg total) by mouth 3 (three) times daily as needed. 60 tablet 1  . metFORMIN (GLUCOPHAGE) 1000 MG tablet Take 1,000 mg by mouth 2 (two) times daily with a meal.     . niacin (NIASPAN) 1000 MG CR tablet TAKE 1 TABLET BY MOUTH AT BEDTIME 30 tablet 3  . nitroGLYCERIN (NITROSTAT) 0.4 MG SL tablet Place 0.4 mg under the tongue every  5 (five) minutes as needed (MAX 3 TABLETS). FOR CHEST PAIN    . omeprazole (PRILOSEC) 20 MG capsule Take 20 mg by mouth daily.     . ondansetron (ZOFRAN) 4 MG tablet Take 1 tablet (4 mg total) by mouth every 8 (eight) hours as needed for nausea or vomiting. 21 tablet 0  . potassium chloride (K-DUR) 10 MEQ tablet Take 1 tablet (10 mEq total) by mouth daily. 90 tablet 3  . torsemide (DEMADEX) 20 MG tablet Take 1 tablet (20 mg total) by mouth 2 (two) times daily. 180 tablet 3  . XARELTO 20 MG TABS tablet TAKE 1 TABLET BY MOUTH EVERY DAY WITH LUNCH 30 tablet 3   No current facility-administered medications for this visit.      Past Medical History:  Diagnosis Date  . Atrial flutter ([place])    a. s/p Cardioversion 11/22/13, on amiodarone and Xarelto.  . CHF (congestive heart failure) ([place])   . Chronic systolic heart failure ([place])    a. 10/2013 EF 20-25%, grade III DD, RV mildly dilated and sys fx mild/mod reduced;  b. 01/2014 Echo: EF 30-35%, gr3 DD, mod dil LA.  Marland Kitchen Coronary artery disease    a. s/p MI 2007/2015;  b. s/p prior PCI to the LAD/LCX/PDA/PL;  c. 2008: s/p Cypher DES to the OM.  Marland Kitchen  Crohn's ileocolitis ([place])   . GERD (gastroesophageal reflux disease)   . Hx of adenomatous colonic polyps 11/2003  . Hyperlipidemia   . Hypertension   . Ischemic cardiomyopathy    s. 01/2014 s/p MDT [id number] [name] XT DR single lead AICD.  Marland Kitchen Obesity   . Paroxysmal atrial fibrillation (HCC)    a. CHA2DS2VASc = 4-->xarelto/amio.  . Sleep apnea   . Syncope    a.  11/2013 in setting of volume depletion and bradycardia due to dig toxicity   . Type II diabetes mellitus (HCC)     ROS:   All systems reviewed and negative except as noted in the HPI.   Past Surgical History:  Procedure Laterality Date  . ATRIAL FLUTTER ABLATION N/A 04/16/2014   Procedure: ATRIAL FLUTTER ABLATION;  Surgeon: Evans Lance, MD;  Location: [facility] CATH LAB;  Service: Cardiovascular;  Laterality: N/A;  . CARDIAC CATHETERIZATION  10/2013    . CARDIAC DEFIBRILLATOR PLACEMENT  04/16/2014   Medtronic Evira device  . CARDIAC ELECTROPHYSIOLOGY STUDY AND ABLATION  04/16/2014   atrial flutter ablation  . CARDIOVERSION N/A 03/05/2014   Procedure: CARDIOVERSION;  Surgeon: Jolaine Artist, MD;  Location: [facility] ENDOSCOPY;  Service: Cardiovascular;  Laterality: N/A;  . CATARACT EXTRACTION W/PHACO Right 01/04/2017   Procedure: CATARACT EXTRACTION PHACO AND INTRAOCULAR LENS PLACEMENT ([place])  Right Diabetic Complicated;  Surgeon: Leandrew Koyanagi, MD;  Location: [place];  Service: Ophthalmology;  Laterality: Right;  Diabetic  . CATARACT EXTRACTION W/PHACO Left 02/08/2017   Procedure: CATARACT EXTRACTION PHACO AND INTRAOCULAR LENS PLACEMENT ([place]) left diabetic;  Surgeon: Leandrew Koyanagi, MD;  Location: [place];  Service: Ophthalmology;  Laterality: Left;  Diabetic - oral meds sleep apnea  . CORONARY ANGIOPLASTY WITH STENT PLACEMENT  2007; 2008 X 2   "1+1 ~ 1"  . FOOT SURGERY Left    bone spur  . HYDROCELE EXCISION Bilateral   . 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 IMPLANTABLE CARDIOVERTER DEFIBRILLATOR IMPLANT N/A 04/16/2014   Procedure: IMPLANTABLE CARDIOVERTER DEFIBRILLATOR IMPLANT;  Surgeon: Evans Lance, MD;  Location: [facility] CATH LAB;  Service: Cardiovascular;  Laterality: N/A;  . LEFT HEART CATHETERIZATION WITH CORONARY ANGIOGRAM N/A 11/22/2013   Procedure: LEFT HEART CATHETERIZATION WITH CORONARY ANGIOGRAM;  Surgeon: Sinclair Grooms, MD;  Location: [facility] CATH LAB;  Service: Cardiovascular;  Laterality: N/A;     Family History  Problem Relation Age of Onset  . Breast cancer Mother   . Heart disease Father   . Heart attack Father   . Colon cancer Neg Hx      Social History   Social History  . Marital status: Widowed    Spouse name: N/A  . Number of children: 1  . Years of education: N/A   Occupational History  . retired Retired   Social History Main Topics  .  Smoking status: Never Smoker  . Smokeless tobacco: Never Used  . Alcohol use No  . Drug use: No  . Sexual activity: No   Other Topics Concern  . Not on file   Social History Narrative  . No narrative on file     BP 100/60   Pulse 76   Ht 5' 11"  (1.803 m)   Wt 272 lb 9.6 oz (123.7 kg)   SpO2 98%   BMI 38.02 kg/m   Physical Exam:  Well appearing NAD HEENT: Unremarkable Neck:  No JVD, no thyromegally Lymphatics:  No adenopathy Back:  No CVA tenderness  Lungs:  Clear with no wheezes HEART:  Regular rate rhythm, no murmurs, no rubs, no clicks Abd:  soft, positive bowel sounds, no organomegally, no rebound, no guarding Ext:  2 plus pulses, no edema, no cyanosis, no clubbing Skin:  No rashes no nodules Neuro:  CN II through XII intact, motor grossly intact   DEVICE  Normal device function.  See PaceArt for details.   Assess/Plan: 1. PAF - he is maintaining NSR over 99.9% of the time. He will continue his amio and xarelto. 2. CAD - he denies anginal symptoms. He is encouraged to increase his physical activity. 3. HTN - his blood pressure is very well controlled. 4. Obesity - I have asked the patient to lose 20 lbs.  5. ICD - his Medtronic DDD ICD is working normally. Will recheck in several months.  Cristopher Peru, M.D.

## 2017-05-04 LAB — CUP PACEART INCLINIC DEVICE CHECK
Brady Statistic AP VS Percent: 0.19 %
Brady Statistic AS VP Percent: 0.04 %
Brady Statistic AS VS Percent: 99.77 %
Date Time Interrogation Session: 20181002135220
HighPow Impedance: 65 Ohm
Implantable Lead Location: 753860
Implantable Lead Model: 6935
Lead Channel Impedance Value: 323 Ohm
Lead Channel Impedance Value: 399 Ohm
Lead Channel Pacing Threshold Amplitude: 1.25 V
Lead Channel Pacing Threshold Pulse Width: 0.4 ms
Lead Channel Sensing Intrinsic Amplitude: 1.875 mV
Lead Channel Sensing Intrinsic Amplitude: 2.625 mV
Lead Channel Sensing Intrinsic Amplitude: 5.125 mV
Lead Channel Setting Pacing Amplitude: 2.5 V
Lead Channel Setting Pacing Pulse Width: 0.4 ms
MDC IDC LEAD IMPLANT DT: 20150916
MDC IDC LEAD IMPLANT DT: 20150916
MDC IDC LEAD LOCATION: 753859
MDC IDC MSMT BATTERY REMAINING LONGEVITY: 101 mo
MDC IDC MSMT BATTERY VOLTAGE: 2.99 V
MDC IDC MSMT LEADCHNL RA PACING THRESHOLD AMPLITUDE: 0.75 V
MDC IDC MSMT LEADCHNL RA PACING THRESHOLD PULSEWIDTH: 0.4 ms
MDC IDC MSMT LEADCHNL RV IMPEDANCE VALUE: 266 Ohm
MDC IDC MSMT LEADCHNL RV SENSING INTR AMPL: 5.25 mV
MDC IDC PG IMPLANT DT: 20150916
MDC IDC SET LEADCHNL RA PACING AMPLITUDE: 1.5 V
MDC IDC SET LEADCHNL RV SENSING SENSITIVITY: 0.3 mV
MDC IDC STAT BRADY AP VP PERCENT: 0.01 %
MDC IDC STAT BRADY RA PERCENT PACED: 0.19 %
MDC IDC STAT BRADY RV PERCENT PACED: 0.05 %

## 2017-05-09 ENCOUNTER — Other Ambulatory Visit: Payer: Self-pay | Admitting: Gastroenterology

## 2017-05-14 ENCOUNTER — Other Ambulatory Visit: Payer: Self-pay | Admitting: Cardiovascular Disease

## 2017-05-15 ENCOUNTER — Other Ambulatory Visit: Payer: Self-pay | Admitting: Cardiovascular Disease

## 2017-05-23 ENCOUNTER — Encounter: Payer: Self-pay | Admitting: Gastroenterology

## 2017-05-23 ENCOUNTER — Telehealth: Payer: Self-pay

## 2017-05-23 ENCOUNTER — Ambulatory Visit (INDEPENDENT_AMBULATORY_CARE_PROVIDER_SITE_OTHER): Payer: Medicare Other | Admitting: Gastroenterology

## 2017-05-23 VITALS — BP 100/68 | HR 76 | Ht 71.0 in | Wt 269.0 lb

## 2017-05-23 DIAGNOSIS — K5080 Crohn's disease of both small and large intestine without complications: Secondary | ICD-10-CM | POA: Diagnosis not present

## 2017-05-23 DIAGNOSIS — Z7901 Long term (current) use of anticoagulants: Secondary | ICD-10-CM

## 2017-05-23 DIAGNOSIS — I255 Ischemic cardiomyopathy: Secondary | ICD-10-CM

## 2017-05-23 DIAGNOSIS — Z86010 Personal history of colonic polyps: Secondary | ICD-10-CM

## 2017-05-23 MED ORDER — NA SULFATE-K SULFATE-MG SULF 17.5-3.13-1.6 GM/177ML PO SOLN: 1.0000 | Freq: Once | ORAL | 0 refills | Status: AC

## 2017-05-23 NOTE — Progress Notes (Signed)
History of Present Illness: This is a 66 year old male with Crohn's disease on Xarelto. Last echocardiogram in 01/2014 with EF=30-35%.  He has no gastrointestinal complaints.  His cardiac problems are stable.  I reviewed his recent office visits with Dr. Crissie Sickles and Dr. Ida Rogue.  He is overdue for colonoscopy and is now willing to proceed.   Colonoscopy 11/2008: 1) Ileitis - Crohn's in the ileum 2) Colitis - Crohn's in the rectum and sigmoid colon 3) 4 mm sessile polyp in the rectum  Allergies  Allergen Reactions  . Iodine Other (See Comments)    Shortness of breath, swelling and hives  . Shrimp [Shellfish Allergy] Other (See Comments)    SWELLING    HIVES    SHORTNESS OF BREATH  . Tetracycline Rash   Outpatient Medications Prior to Visit  Medication Sig Dispense Refill  . acetaminophen (TYLENOL) 325 MG tablet Take 1-2 tablets (325-650 mg total) by mouth every 4 (four) hours as needed for mild pain.    Marland Kitchen amiodarone (PACERONE) 200 MG tablet TAKE 1 TABLET BY MOUTH MON.-FRIDAY AND 1/2 TABLET BY MOUTH SATURDAY AND SUNDAY. 30 tablet 7  . aspirin 81 MG tablet Take 81 mg by mouth daily.     Marland Kitchen atorvastatin (LIPITOR) 80 MG tablet TAKE 1 TABLET (80 MG TOTAL) BY MOUTH DAILY. 30 tablet 6  . balsalazide (COLAZAL) 750 MG capsule TAKE 1 CAPSULE (750 MG TOTAL) BY MOUTH 3 (THREE) TIMES DAILY. 90 capsule 0  . carvedilol (COREG) 12.5 MG tablet TAKE 1 TABLET (12.5 MG TOTAL) BY MOUTH 2 (TWO) TIMES DAILY. 60 tablet 3  . ENTRESTO 49-51 MG TAKE 1 TABLET BY MOUTH 2 (TWO) TIMES DAILY. 60 tablet 3  . meclizine (ANTIVERT) 25 MG tablet Take 1 tablet (25 mg total) by mouth 3 (three) times daily as needed. 60 tablet 1  . metFORMIN (GLUCOPHAGE) 1000 MG tablet Take 1,000 mg by mouth 2 (two) times daily with a meal.     . niacin (NIASPAN) 1000 MG CR tablet TAKE 1 TABLET BY MOUTH AT BEDTIME 30 tablet 3  . nitroGLYCERIN (NITROSTAT) 0.4 MG SL tablet Place 0.4 mg under the tongue every 5 (five) minutes as  needed (MAX 3 TABLETS). FOR CHEST PAIN    . omeprazole (PRILOSEC) 20 MG capsule Take 20 mg by mouth daily.     . potassium chloride (K-DUR) 10 MEQ tablet Take 1 tablet (10 mEq total) by mouth daily. 90 tablet 3  . torsemide (DEMADEX) 20 MG tablet Take 1 tablet (20 mg total) by mouth 2 (two) times daily. 180 tablet 3  . XARELTO 20 MG TABS tablet TAKE 1 TABLET BY MOUTH EVERY DAY WITH LUNCH 30 tablet 3  . ondansetron (ZOFRAN) 4 MG tablet Take 1 tablet (4 mg total) by mouth every 8 (eight) hours as needed for nausea or vomiting. 21 tablet 0   No facility-administered medications prior to visit.    Past Medical History:  Diagnosis Date  . Atrial flutter ([place])    a. s/p Cardioversion 11/22/13, on amiodarone and Xarelto.  . CHF (congestive heart failure) ([place])   . Chronic systolic heart failure ([place])    a. 10/2013 EF 20-25%, grade III DD, RV mildly dilated and sys fx mild/mod reduced;  b. 01/2014 Echo: EF 30-35%, gr3 DD, mod dil LA.  Marland Kitchen Coronary artery disease    a. s/p MI 2007/2015;  b. s/p prior PCI to the LAD/LCX/PDA/PL;  c. 2008: s/p Cypher DES to the OM.  Marland Kitchen  Crohn's ileocolitis ([place])   . GERD (gastroesophageal reflux disease)   . Hx of adenomatous colonic polyps 11/2003  . Hyperlipidemia   . Hypertension   . Ischemic cardiomyopathy    s. 01/2014 s/p MDT [id number] [name] XT DR single lead AICD.  Marland Kitchen Obesity   . Paroxysmal atrial fibrillation (HCC)    a. CHA2DS2VASc = 4-->xarelto/amio.  . Sleep apnea   . Syncope    a.  11/2013 in setting of volume depletion and bradycardia due to dig toxicity   . Type II diabetes mellitus ([place])    Past Surgical History:  Procedure Laterality Date  . ATRIAL FLUTTER ABLATION N/A 04/16/2014   Procedure: ATRIAL FLUTTER ABLATION;  Surgeon: Evans Lance, MD;  Location: [facility] CATH LAB;  Service: Cardiovascular;  Laterality: N/A;  . CARDIAC CATHETERIZATION  10/2013  . CARDIAC DEFIBRILLATOR PLACEMENT  04/16/2014   Medtronic Evira device  . CARDIAC ELECTROPHYSIOLOGY STUDY  AND ABLATION  04/16/2014   atrial flutter ablation  . CARDIOVERSION N/A 03/05/2014   Procedure: CARDIOVERSION;  Surgeon: Jolaine Artist, MD;  Location: [facility] ENDOSCOPY;  Service: Cardiovascular;  Laterality: N/A;  . CATARACT EXTRACTION W/PHACO Right 01/04/2017   Procedure: CATARACT EXTRACTION PHACO AND INTRAOCULAR LENS PLACEMENT ([place])  Right Diabetic Complicated;  Surgeon: Leandrew Koyanagi, MD;  Location: [place];  Service: Ophthalmology;  Laterality: Right;  Diabetic  . CATARACT EXTRACTION W/PHACO Left 02/08/2017   Procedure: CATARACT EXTRACTION PHACO AND INTRAOCULAR LENS PLACEMENT ([place]) left diabetic;  Surgeon: Leandrew Koyanagi, MD;  Location: [place];  Service: Ophthalmology;  Laterality: Left;  Diabetic - oral meds sleep apnea  . CORONARY ANGIOPLASTY WITH STENT PLACEMENT  2007; 2008 X 2   "1+1 ~ 1"  . FOOT SURGERY Left    bone spur  . HYDROCELE EXCISION Bilateral   . 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 IMPLANTABLE CARDIOVERTER DEFIBRILLATOR IMPLANT N/A 04/16/2014   Procedure: IMPLANTABLE CARDIOVERTER DEFIBRILLATOR IMPLANT;  Surgeon: Evans Lance, MD;  Location: [facility] CATH LAB;  Service: Cardiovascular;  Laterality: N/A;  . LEFT HEART CATHETERIZATION WITH CORONARY ANGIOGRAM N/A 11/22/2013   Procedure: LEFT HEART CATHETERIZATION WITH CORONARY ANGIOGRAM;  Surgeon: Sinclair Grooms, MD;  Location: [facility] CATH LAB;  Service: Cardiovascular;  Laterality: N/A;   Social History   Social History  . Marital status: Widowed    Spouse name: N/A  . Number of children: 1  . Years of education: N/A   Occupational History  . retired Retired   Social History Main Topics  . Smoking status: Never Smoker  . Smokeless tobacco: Never Used  . Alcohol use No  . Drug use: No  . Sexual activity: No   Other Topics Concern  . None   Social History Narrative  . None   Family History  Problem Relation Age of Onset  . Breast cancer Mother     . Heart disease Father   . Heart attack Father   . Colon cancer Neg Hx        Physical Exam: General: Well developed, well nourished, obese, no acute distress Head: Normocephalic and atraumatic Eyes:  sclerae anicteric, EOMI Ears: Normal auditory acuity Mouth: No deformity or lesions Lungs: Clear throughout to auscultation Heart: Regular rate and rhythm; no murmurs, rubs or bruits Abdomen: Soft, non tender and non distended. No masses, hepatosplenomegaly or hernias noted. Normal Bowel sounds Rectal: deferred to colonoscopy Musculoskeletal: Symmetrical with no gross deformities  Pulses:  Normal pulses noted Extremities: No clubbing, cyanosis,  edema or deformities noted Neurological: Alert oriented x 4, grossly nonfocal Psychological:  Alert and cooperative. Normal mood and affect  Assessment and Recommendations:  1. Crohn's disease and personal history of adenomatous colon polyps.  Refill balsalazide.  Schedule colonoscopy. The risks (including bleeding, perforation, infection, missed lesions, medication reactions and possible hospitalization or surgery if complications occur), benefits, and alternatives to colonoscopy with possible biopsy and possible polypectomy were discussed with the patient and they consent to proceed.   2.  PAF on Xarelto. Hold Xarelto 2 days before procedure - will instruct when and how to resume after procedure. Low but real risk of cardiovascular event such as heart attack, stroke, embolism, thrombosis or ischemia/infarct of other organs off Xarelto explained and need to seek urgent help if this occurs. The patient consents to proceed. Will communicate by phone or EMR with patient's prescribing provider to confirm that holding Xarelto is reasonable in this case.   3. Cardiomyopathy, EF=30-35%. S/P ICD implant.

## 2017-05-23 NOTE — Patient Instructions (Signed)
You have been scheduled for a colonoscopy. Please follow written instructions given to you at your visit today.  Please pick up your prep supplies at the pharmacy within the next 1-3 days. If you use inhalers (even only as needed), please bring them with you on the day of your procedure.  Thank you for choosing me and [facility].  [name]. Dagoberto Ligas., MD., [facility]al

## 2017-05-23 NOTE — Telephone Encounter (Signed)
   Colton Lamb 09/14/1950 [id number]  Dear Dr. Rockey Situ:  We have scheduled the above named patient for a(n) Colonoscopy procedure. Our records show that (s)he is on anticoagulation therapy.  Please advise as to whether the patient may come off their therapy of Xarelto 2 days prior to their procedure which is scheduled for 06/28/17.  Please route your response to Marlon Pel, CMA or fax response to [phone].  Sincerely,    [facility]

## 2017-05-24 NOTE — Telephone Encounter (Signed)
Ok to stop xarelto 2 days before After the procedure, when he restarts xarelto, he can stop aspirin

## 2017-05-25 NOTE — Telephone Encounter (Signed)
Informed patient to hold Xarelto 2 days prior to his procedure per Dr. Rockey Situ. Patient verbalized understanding.

## 2017-05-25 NOTE — Telephone Encounter (Signed)
Information routed to [facility] CMA as requested.

## 2017-05-29 ENCOUNTER — Other Ambulatory Visit: Payer: Self-pay | Admitting: Internal Medicine

## 2017-06-12 ENCOUNTER — Other Ambulatory Visit: Payer: Self-pay | Admitting: Cardiovascular Disease

## 2017-06-12 ENCOUNTER — Other Ambulatory Visit: Payer: Self-pay | Admitting: Gastroenterology

## 2017-06-12 NOTE — Telephone Encounter (Signed)
Please review for refill, Thanks !  

## 2017-06-16 DIAGNOSIS — I10 Essential (primary) hypertension: Secondary | ICD-10-CM | POA: Insufficient documentation

## 2017-06-16 DIAGNOSIS — G2581 Restless legs syndrome: Secondary | ICD-10-CM | POA: Insufficient documentation

## 2017-06-19 ENCOUNTER — Telehealth: Payer: Self-pay | Admitting: Gastroenterology

## 2017-06-19 NOTE — Telephone Encounter (Signed)
Looking at your schedule, there is some availability on 07/17/17 at [facility] to reschedule. Otherwise, it will be in January. Please advise, thanks.

## 2017-06-19 NOTE — Telephone Encounter (Signed)
Patient states his care partner is not able to bring him on 11.28.18 for procedure at [facility], so he needs to cancel it. Patient states he would like a call back to resch

## 2017-06-19 NOTE — Telephone Encounter (Signed)
Yes 12/17 is fine with me. If that is not a good day for the patient then January.

## 2017-06-20 ENCOUNTER — Other Ambulatory Visit: Payer: Self-pay

## 2017-06-20 NOTE — Telephone Encounter (Signed)
Patient cannot do the 12/17 colonoscopy date, he did reschedule to 08/14/17 at [facility] at 8:30. I have resent prep instructions including diabetic medication and Xarelto recommendations from Dr. Rockey Situ.

## 2017-06-20 NOTE — Telephone Encounter (Signed)
Left message for patient to call our office back to get his hospital procedure rescheduled.

## 2017-07-05 ENCOUNTER — Encounter: Payer: Self-pay | Admitting: Gastroenterology

## 2017-07-27 ENCOUNTER — Ambulatory Visit (INDEPENDENT_AMBULATORY_CARE_PROVIDER_SITE_OTHER): Payer: Medicare Other | Admitting: *Deleted

## 2017-07-27 DIAGNOSIS — I255 Ischemic cardiomyopathy: Secondary | ICD-10-CM | POA: Diagnosis not present

## 2017-07-27 LAB — CUP PACEART REMOTE DEVICE CHECK
Battery Remaining Longevity: 97 mo
Battery Voltage: 2.97 V
Brady Statistic AS VP Percent: 0.18 %
Brady Statistic RA Percent Paced: 0.74 %
HighPow Impedance: 67 Ohm
Implantable Lead Implant Date: 20150916
Implantable Lead Location: 753860
Implantable Lead Model: 5076
Implantable Lead Model: 6935
Implantable Pulse Generator Implant Date: 20150916
Lead Channel Impedance Value: 323 Ohm
Lead Channel Pacing Threshold Pulse Width: 0.4 ms
Lead Channel Sensing Intrinsic Amplitude: 1.875 mV
Lead Channel Sensing Intrinsic Amplitude: 1.875 mV
Lead Channel Sensing Intrinsic Amplitude: 5 mV
Lead Channel Sensing Intrinsic Amplitude: 5 mV
Lead Channel Setting Pacing Amplitude: 1.5 V
MDC IDC LEAD IMPLANT DT: 20150916
MDC IDC LEAD LOCATION: 753859
MDC IDC MSMT LEADCHNL RA IMPEDANCE VALUE: 380 Ohm
MDC IDC MSMT LEADCHNL RA PACING THRESHOLD AMPLITUDE: 0.75 V
MDC IDC MSMT LEADCHNL RV IMPEDANCE VALUE: 266 Ohm
MDC IDC MSMT LEADCHNL RV PACING THRESHOLD AMPLITUDE: 1.375 V
MDC IDC MSMT LEADCHNL RV PACING THRESHOLD PULSEWIDTH: 0.4 ms
MDC IDC SESS DTM: 20181227051704
MDC IDC SET LEADCHNL RV PACING AMPLITUDE: 2.75 V
MDC IDC SET LEADCHNL RV PACING PULSEWIDTH: 0.4 ms
MDC IDC SET LEADCHNL RV SENSING SENSITIVITY: 0.3 mV
MDC IDC STAT BRADY AP VP PERCENT: 0 %
MDC IDC STAT BRADY AP VS PERCENT: 0.73 %
MDC IDC STAT BRADY AS VS PERCENT: 99.09 %
MDC IDC STAT BRADY RV PERCENT PACED: 0.18 %

## 2017-07-27 NOTE — Progress Notes (Signed)
Remote ICD transmission.   

## 2017-07-28 ENCOUNTER — Encounter: Payer: Self-pay | Admitting: Cardiology

## 2017-08-03 ENCOUNTER — Encounter (HOSPITAL_COMMUNITY): Payer: Self-pay | Admitting: Emergency Medicine

## 2017-08-03 ENCOUNTER — Other Ambulatory Visit: Payer: Self-pay

## 2017-08-12 ENCOUNTER — Other Ambulatory Visit: Payer: Self-pay | Admitting: Cardiovascular Disease

## 2017-08-14 ENCOUNTER — Ambulatory Visit (HOSPITAL_COMMUNITY): Admission: RE | Admit: 2017-08-14 | Payer: Medicare Other | Source: Ambulatory Visit | Admitting: Gastroenterology

## 2017-08-14 SURGERY — COLONOSCOPY WITH PROPOFOL
Anesthesia: Monitor Anesthesia Care

## 2017-09-11 ENCOUNTER — Other Ambulatory Visit: Payer: Self-pay | Admitting: Cardiovascular Disease

## 2017-10-09 ENCOUNTER — Other Ambulatory Visit: Payer: Self-pay | Admitting: Cardiovascular Disease

## 2017-10-09 NOTE — Telephone Encounter (Signed)
Please review for refill, Thanks !  

## 2017-10-24 ENCOUNTER — Other Ambulatory Visit: Payer: Self-pay | Admitting: Cardiovascular Disease

## 2017-10-26 ENCOUNTER — Ambulatory Visit (INDEPENDENT_AMBULATORY_CARE_PROVIDER_SITE_OTHER): Payer: Medicare Other | Admitting: *Deleted

## 2017-10-26 DIAGNOSIS — I255 Ischemic cardiomyopathy: Secondary | ICD-10-CM

## 2017-10-27 NOTE — Progress Notes (Signed)
Remote ICD transmission.   

## 2017-10-30 ENCOUNTER — Encounter: Payer: Self-pay | Admitting: Cardiology

## 2017-11-07 LAB — CUP PACEART REMOTE DEVICE CHECK
Battery Remaining Longevity: 94 mo
Battery Voltage: 2.96 V
Brady Statistic RV Percent Paced: 0.05 %
Date Time Interrogation Session: 20190328103424
HIGH POWER IMPEDANCE MEASURED VALUE: 68 Ohm
Implantable Lead Implant Date: 20150916
Implantable Lead Location: 753859
Implantable Lead Model: 5076
Implantable Pulse Generator Implant Date: 20150916
Lead Channel Impedance Value: 266 Ohm
Lead Channel Pacing Threshold Amplitude: 0.75 V
Lead Channel Pacing Threshold Pulse Width: 0.4 ms
Lead Channel Sensing Intrinsic Amplitude: 1.625 mV
Lead Channel Setting Pacing Amplitude: 2.5 V
Lead Channel Setting Sensing Sensitivity: 0.3 mV
MDC IDC LEAD IMPLANT DT: 20150916
MDC IDC LEAD LOCATION: 753860
MDC IDC MSMT LEADCHNL RA IMPEDANCE VALUE: 380 Ohm
MDC IDC MSMT LEADCHNL RA SENSING INTR AMPL: 1.625 mV
MDC IDC MSMT LEADCHNL RV IMPEDANCE VALUE: 323 Ohm
MDC IDC MSMT LEADCHNL RV PACING THRESHOLD AMPLITUDE: 1.25 V
MDC IDC MSMT LEADCHNL RV PACING THRESHOLD PULSEWIDTH: 0.4 ms
MDC IDC MSMT LEADCHNL RV SENSING INTR AMPL: 4.75 mV
MDC IDC MSMT LEADCHNL RV SENSING INTR AMPL: 4.75 mV
MDC IDC SET LEADCHNL RA PACING AMPLITUDE: 1.5 V
MDC IDC SET LEADCHNL RV PACING PULSEWIDTH: 0.4 ms
MDC IDC STAT BRADY AP VP PERCENT: 0 %
MDC IDC STAT BRADY AP VS PERCENT: 0.29 %
MDC IDC STAT BRADY AS VP PERCENT: 0.04 %
MDC IDC STAT BRADY AS VS PERCENT: 99.66 %
MDC IDC STAT BRADY RA PERCENT PACED: 0.29 %

## 2017-11-08 ENCOUNTER — Other Ambulatory Visit: Payer: Self-pay | Admitting: Cardiovascular Disease

## 2017-12-03 ENCOUNTER — Other Ambulatory Visit: Payer: Self-pay | Admitting: Cardiovascular Disease

## 2017-12-08 ENCOUNTER — Other Ambulatory Visit: Payer: Self-pay | Admitting: Cardiovascular Disease

## 2017-12-10 ENCOUNTER — Other Ambulatory Visit: Payer: Self-pay

## 2017-12-10 ENCOUNTER — Emergency Department
Admission: EM | Admit: 2017-12-10 | Discharge: 2017-12-10 | Disposition: A | Discharge status: Home / Self Care | Payer: Medicare Other | Attending: Emergency Medicine | Admitting: Emergency Medicine

## 2017-12-10 ENCOUNTER — Emergency Department: Payer: Medicare Other

## 2017-12-10 DIAGNOSIS — I2510 Atherosclerotic heart disease of native coronary artery without angina pectoris: Secondary | ICD-10-CM | POA: Diagnosis not present

## 2017-12-10 DIAGNOSIS — Z832 Family history of diseases of the blood and blood-forming organs and certain disorders involving the immune mechanism: Secondary | ICD-10-CM | POA: Insufficient documentation

## 2017-12-10 DIAGNOSIS — I110 Hypertensive heart disease with heart failure: Secondary | ICD-10-CM | POA: Diagnosis not present

## 2017-12-10 DIAGNOSIS — J441 Chronic obstructive pulmonary disease with (acute) exacerbation: Secondary | ICD-10-CM | POA: Diagnosis not present

## 2017-12-10 DIAGNOSIS — Z7901 Long term (current) use of anticoagulants: Secondary | ICD-10-CM | POA: Insufficient documentation

## 2017-12-10 DIAGNOSIS — I509 Heart failure, unspecified: Secondary | ICD-10-CM

## 2017-12-10 DIAGNOSIS — Z7984 Long term (current) use of oral hypoglycemic drugs: Secondary | ICD-10-CM | POA: Insufficient documentation

## 2017-12-10 DIAGNOSIS — Z79899 Other long term (current) drug therapy: Secondary | ICD-10-CM | POA: Diagnosis not present

## 2017-12-10 DIAGNOSIS — I5023 Acute on chronic systolic (congestive) heart failure: Secondary | ICD-10-CM | POA: Diagnosis not present

## 2017-12-10 DIAGNOSIS — E119 Type 2 diabetes mellitus without complications: Secondary | ICD-10-CM | POA: Insufficient documentation

## 2017-12-10 DIAGNOSIS — Z7982 Long term (current) use of aspirin: Secondary | ICD-10-CM | POA: Insufficient documentation

## 2017-12-10 DIAGNOSIS — R0602 Shortness of breath: Secondary | ICD-10-CM | POA: Diagnosis present

## 2017-12-10 LAB — CBC WITH DIFFERENTIAL/PLATELET
BASOS PCT: 0 %
Basophils Absolute: 0 10*3/uL (ref 0–0.1)
EOS ABS: 0 10*3/uL (ref 0–0.7)
EOS PCT: 0 %
HCT: 37.4 % — ABNORMAL LOW (ref 40.0–52.0)
Hemoglobin: 12.7 g/dL — ABNORMAL LOW (ref 13.0–18.0)
LYMPHS ABS: 0.8 10*3/uL — AB (ref 1.0–3.6)
Lymphocytes Relative: 7 %
MCH: 28.5 pg (ref 26.0–34.0)
MCHC: 33.9 g/dL (ref 32.0–36.0)
MCV: 84 fL (ref 80.0–100.0)
MONO ABS: 0.8 10*3/uL (ref 0.2–1.0)
MONOS PCT: 7 %
Neutro Abs: 9.1 10*3/uL — ABNORMAL HIGH (ref 1.4–6.5)
Neutrophils Relative %: 86 %
Platelets: 189 10*3/uL (ref 150–440)
RBC: 4.45 MIL/uL (ref 4.40–5.90)
RDW: 16 % — AB (ref 11.5–14.5)
WBC: 10.8 10*3/uL — AB (ref 3.8–10.6)

## 2017-12-10 LAB — BASIC METABOLIC PANEL
Anion gap: 13 (ref 5–15)
BUN: 21 mg/dL — ABNORMAL HIGH (ref 6–20)
CALCIUM: 8.3 mg/dL — AB (ref 8.9–10.3)
CO2: 22 mmol/L (ref 22–32)
Chloride: 98 mmol/L — ABNORMAL LOW (ref 101–111)
Creatinine, Ser: 1.02 mg/dL (ref 0.61–1.24)
GLUCOSE: 226 mg/dL — AB (ref 65–99)
POTASSIUM: 4 mmol/L (ref 3.5–5.1)
Sodium: 133 mmol/L — ABNORMAL LOW (ref 135–145)

## 2017-12-10 LAB — TROPONIN I

## 2017-12-10 LAB — BRAIN NATRIURETIC PEPTIDE: B Natriuretic Peptide: 887 pg/mL — ABNORMAL HIGH (ref 0.0–100.0)

## 2017-12-10 MED ORDER — IPRATROPIUM-ALBUTEROL 0.5-2.5 (3) MG/3ML IN SOLN
3.0000 mL | Freq: Once | RESPIRATORY_TRACT | Status: AC
  Administered 2017-12-10: 3 mL via RESPIRATORY_TRACT
  Filled 2017-12-10: qty 3

## 2017-12-10 MED ORDER — AMOXICILLIN-POT CLAVULANATE 875-125 MG PO TABS
1.0000 | ORAL_TABLET | Freq: Once | ORAL | Status: AC
Start: 2017-12-10 — End: 2017-12-10
  Administered 2017-12-10: 1 via ORAL
  Filled 2017-12-10: qty 1

## 2017-12-10 MED ORDER — ALBUTEROL SULFATE HFA 108 (90 BASE) MCG/ACT IN AERS: 2.0000 | INHALATION_SPRAY | Freq: Four times a day (QID) | RESPIRATORY_TRACT | 2 refills | Status: DC | PRN

## 2017-12-10 MED ORDER — IPRATROPIUM-ALBUTEROL 0.5-2.5 (3) MG/3ML IN SOLN
RESPIRATORY_TRACT | Status: AC
  Filled 2017-12-10: qty 3

## 2017-12-10 MED ORDER — IPRATROPIUM-ALBUTEROL 0.5-2.5 (3) MG/3ML IN SOLN
3.0000 mL | Freq: Once | RESPIRATORY_TRACT | Status: AC
  Filled 2017-12-10: qty 3

## 2017-12-10 MED ORDER — PREDNISONE 20 MG PO TABS: 60.0000 mg | ORAL_TABLET | Freq: Every day | ORAL | 0 refills | Status: AC

## 2017-12-10 MED ORDER — AMOXICILLIN-POT CLAVULANATE 875-125 MG PO TABS: 1.0000 | ORAL_TABLET | Freq: Two times a day (BID) | ORAL | 0 refills | Status: AC

## 2017-12-10 MED ORDER — FUROSEMIDE 10 MG/ML IJ SOLN
40.0000 mg | Freq: Once | INTRAMUSCULAR | Status: AC
  Administered 2017-12-10: 40 mg via INTRAVENOUS
  Filled 2017-12-10: qty 4

## 2017-12-10 MED ORDER — METHYLPREDNISOLONE SODIUM SUCC 125 MG IJ SOLR
125.0000 mg | Freq: Once | INTRAMUSCULAR | Status: AC
  Administered 2017-12-10: 125 mg via INTRAVENOUS
  Filled 2017-12-10: qty 2

## 2017-12-10 NOTE — Discharge Instructions (Signed)
Take prednisone and antibiotic as prescribed. Use your inhaler 2 puffs every 4 hours as needed for shortness of breath. Increase your torsemide to 40 mg daily for 2 days starting tomorrow and then resume your normal dose. Follow up with your doctor and your cardiologist in 2 days for reevaluation.  Return to the emergency room if you have a fever, worsening shortness of breath or chest pain.

## 2017-12-10 NOTE — ED Provider Notes (Addendum)
[facility] Emergency Department Provider Note  ____________________________________________  Time seen: Approximately 6:49 PM  I have reviewed the triage vital signs and the nursing notes.   HISTORY  Chief Complaint Shortness of Breath   HPI Colton Lamb is a 67 y.o. male with a history of CHF with EF of 20 to 25% (2015), atrial flutter on Xarelto, CAD status post stents, OSA, diabetes, hypertension, hyperlipidemia who presents for evaluation of shortness of breath.  Patient reports a dry cough for several days and today started having chest tightness and shortness of breath.  The shortness of breath is mild at rest and it becomes moderate to severe with ambulation.  He is complaining of a mild central chest tightness associated with it.  He reports that he does not have any inhalers at home.  He has had no fever or chills.  Endorses compliance with his torsemide.  He denies any personal or family history of blood clots and endorses compliance with his Xarelto.  He has had swelling of his legs over the last few days.  He denies weight gain.  No fever or chills, no nausea or vomiting. EMS gave patient 55m of albuterol and patient feels improved.  Past Medical History:  Diagnosis Date  . Atrial flutter ([place]    a. s/p Cardioversion 11/22/13, on amiodarone and Xarelto.  . CHF (congestive heart failure) ([place]   . Chronic systolic heart failure (HB[place]    a. 10/2013 EF 20-25%, grade III DD, RV mildly dilated and sys fx mild/mod reduced;  b. 01/2014 Echo: EF 30-35%, gr3 DD, mod dil LA.  .Marland KitchenCoronary artery disease    a. s/p MI 2007/2015;  b. s/p prior PCI to the LAD/LCX/PDA/PL;  c. 2008: s/p Cypher DES to the OM.  .Marland KitchenCrohn's ileocolitis (HW[place]   . GERD (gastroesophageal reflux disease)   . Hx of adenomatous colonic polyps 11/2003  . Hyperlipidemia   . Hypertension   . Ischemic cardiomyopathy    s. 01/2014 s/p MDT [id number] EGwyneth RevelsXT DR single lead AICD.  .Marland KitchenObesity   .  Paroxysmal atrial fibrillation (HCC)    a. CHA2DS2VASc = 4-->xarelto/amio.  . Sleep apnea   . Syncope    a.  11/2013 in setting of volume depletion and bradycardia due to dig toxicity   . Type II diabetes mellitus ([facility]     Patient Active Problem List   Diagnosis Date Noted  . Coronary artery disease of native artery of native heart with stable angina pectoris (HRondo 04/27/2017  . Hydrocephalus 09/08/2016  . Dizziness and giddiness   . Elevated troponin 09/06/2016  . Type II diabetes mellitus (HBa[place]   . Ischemic cardiomyopathy   . Hyponatremia 07/01/2015  . Diabetes mellitus type 2, uncontrolled, with complications (HMount Ple[place] 116/96/7893 . 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 OSA (obstructive sleep apnea) 12/20/2013  . Morbid obesity (HShively 12/20/2013  . AKI (acute kidney injury) - Creatinine improved at d/c 12/14/2013  . Elevated TSH - will need f/u TFTs with PCP in 3-4 weeks 12/14/2013  . Junctional bradycardia - resolved 12/14/2013  . Syncope - due to bradycardia in setting of Digoxin Toxicity 12/11/2013  . Chronic systolic heart failure (H[place] 12/06/2013  . At risk for sudden cardiac death - on LifeVest 0May 18, 2015 . Acute combined systolic and diastolic CHF, NYHA class 3 -- 2/2 MI 018-May-2015 . Cardiomyopathy, ischemic 005-18-2015 . Atrial flutter ([place] 11/22/2013  . NSTEMI (non-ST elevated myocardial infarction) ([place] 11/22/2013  .  Long term current use of anticoagulant 10/19/2010  . Hyperlipidemia 05/06/2010  . Edema 05/06/2010  . CAD S/P percutaneous coronary angioplasty - multiple PCIs 11/11/2008  . Atrial fibrillation ([place]) 11/11/2008  . GERD 11/11/2008  . [place] INTESTINE 11/11/2008  . COLONIC POLYPS, HX OF 11/11/2008  . [place] ALLERGY 11/11/2008    Past Surgical History:  Procedure Laterality Date  . ATRIAL FLUTTER ABLATION N/A 04/16/2014   Procedure: ATRIAL FLUTTER ABLATION;  Surgeon: Evans Lance, MD;   Location: [facility] CATH LAB;  Service: Cardiovascular;  Laterality: N/A;  . CARDIAC CATHETERIZATION  10/2013  . CARDIAC DEFIBRILLATOR PLACEMENT  04/16/2014   Medtronic Evira device  . CARDIAC ELECTROPHYSIOLOGY STUDY AND ABLATION  04/16/2014   atrial flutter ablation  . CARDIOVERSION N/A 03/05/2014   Procedure: CARDIOVERSION;  Surgeon: Jolaine Artist, MD;  Location: [facility] ENDOSCOPY;  Service: Cardiovascular;  Laterality: N/A;  . CATARACT EXTRACTION W/PHACO Right 01/04/2017   Procedure: CATARACT EXTRACTION PHACO AND INTRAOCULAR LENS PLACEMENT ([place])  Right Diabetic Complicated;  Surgeon: Leandrew Koyanagi, MD;  Location: [place];  Service: Ophthalmology;  Laterality: Right;  Diabetic  . CATARACT EXTRACTION W/PHACO Left 02/08/2017   Procedure: CATARACT EXTRACTION PHACO AND INTRAOCULAR LENS PLACEMENT ([place]) left diabetic;  Surgeon: Leandrew Koyanagi, MD;  Location: [place];  Service: Ophthalmology;  Laterality: Left;  Diabetic - oral meds sleep apnea  . CORONARY ANGIOPLASTY WITH STENT PLACEMENT  2007; 2008 X 2   "1+1 ~ 1"  . FOOT SURGERY Left    bone spur  . HYDROCELE EXCISION Bilateral   . 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 IMPLANTABLE CARDIOVERTER DEFIBRILLATOR IMPLANT N/A 04/16/2014   Procedure: IMPLANTABLE CARDIOVERTER DEFIBRILLATOR IMPLANT;  Surgeon: Evans Lance, MD;  Location: [facility] CATH LAB;  Service: Cardiovascular;  Laterality: N/A;  . LEFT HEART CATHETERIZATION WITH CORONARY ANGIOGRAM N/A 11/22/2013   Procedure: LEFT HEART CATHETERIZATION WITH CORONARY ANGIOGRAM;  Surgeon: Sinclair Grooms, MD;  [facility];  Service: Cardiovascular;  Laterality: N/A;    Prior to Admission medications   Medication Sig Start Date End Date Taking? Authorizing Provider  acetaminophen (TYLENOL) 325 MG tablet Take 1-2 tablets (325-650 mg total) by mouth every 4 (four) hours as needed for mild pain. 04/17/14   [name], NP  albuterol  (PROVENTIL HFA;VENTOLIN HFA) 108 (90 Base) MCG/ACT inhaler Inhale 2 puffs into the lungs every 6 (six) hours as needed for wheezing or shortness of breath. 12/10/17   Rudene Re, MD  amiodarone (PACERONE) 200 MG tablet TAKE 1 TABLET BY MOUTH MON.-FRIDAY AND 1/2 TABLET BY MOUTH SATURDAY AND SUNDAY. 05/30/17   Evans Lance, MD  amoxicillin-clavulanate (AUGMENTIN) 875-125 MG tablet Take 1 tablet by mouth 2 (two) times daily for 7 days. 12/10/17 12/17/17  Rudene Re, MD  aspirin 81 MG tablet Take 81 mg by mouth daily.     [provider]  atorvastatin (LIPITOR) 80 MG tablet TAKE 1 TABLET (80 MG TOTAL) BY MOUTH DAILY. 11/08/17   Minna Merritts, MD  balsalazide (COLAZAL) 750 MG capsule TAKE 1 CAPSULE (750 MG TOTAL) BY MOUTH 3 (THREE) TIMES DAILY. 06/12/17   Ladene Artist, MD  carvedilol (COREG) 12.5 MG tablet TAKE 1 TABLET (12.5 MG TOTAL) BY MOUTH 2 (TWO) TIMES DAILY. 12/08/17   Gollan, Kathlene November, MD  ENTRESTO 49-51 MG TAKE 1 TABLET BY MOUTH 2 (TWO) TIMES DAILY. 04/12/17   Minna Merritts, MD  KLOR-CON 10 10 MEQ tablet  TAKE 1 TABLET (10 MEQ TOTAL) BY MOUTH DAILY. 10/24/17   Minna Merritts, MD  meclizine (ANTIVERT) 25 MG tablet Take 1 tablet (25 mg total) by mouth 3 (three) times daily as needed. Patient taking differently: Take 25 mg by mouth 3 (three) times daily as needed for dizziness.  11/01/16   Minna Merritts, MD  metFORMIN (GLUCOPHAGE) 1000 MG tablet Take 1,000 mg by mouth 2 (two) times daily with a meal.  06/05/15   [provider]  niacin (NIASPAN) 1000 MG CR tablet TAKE 1 TABLET BY MOUTH AT BEDTIME 12/08/17   Gollan, Kathlene November, MD  nitroGLYCERIN (NITROSTAT) 0.4 MG SL tablet Place 0.4 mg under the tongue every 5 (five) minutes as needed (MAX 3 TABLETS). FOR CHEST PAIN    [provider]  omeprazole (PRILOSEC) 20 MG capsule Take 20 mg by mouth daily.     [provider]  predniSONE (DELTASONE) 20 MG tablet Take 3 tablets (60 mg total) by  mouth daily for 4 days. 12/10/17 12/14/17  Rudene Re, MD  torsemide (DEMADEX) 20 MG tablet TAKE 1 TABLET (20 MG TOTAL) BY MOUTH 2 (TWO) TIMES DAILY. 12/04/17   Minna Merritts, MD  XARELTO 20 MG TABS tablet TAKE 1 TABLET BY MOUTH EVERY DAY WITH LUNCH 10/09/17   Minna Merritts, MD    Allergies Iodine; Shrimp [shellfish allergy]; and Tetracycline  Family History  Problem Relation Age of Onset  . Breast cancer Mother   . Heart disease Father   . Heart attack Father   . Colon cancer Neg Hx     Social History Social History   Tobacco Use  . Smoking status: Never Smoker  . Smokeless tobacco: Never Used  Substance Use Topics  . Alcohol use: No  . Drug use: No    Review of Systems  Constitutional: Negative for fever. Eyes: Negative for visual changes. ENT: Negative for sore throat. Neck: No neck pain  Cardiovascular: + chest tightness Respiratory: + shortness of breath, cough Gastrointestinal: Negative for abdominal pain, vomiting or diarrhea. Genitourinary: Negative for dysuria. Musculoskeletal: Negative for back pain. Skin: Negative for rash. Neurological: Negative for headaches, weakness or numbness. Psych: No SI or HI  ____________________________________________   PHYSICAL EXAM:  VITAL SIGNS: ED Triage Vitals  Enc Vitals Group     BP 12/10/17 1839 (!) 157/99     Pulse Rate 12/10/17 1839 92     Resp 12/10/17 1839 17     Temp 12/10/17 1839 99 F (37.2 C)     Temp Source 12/10/17 1839 Oral     SpO2 12/10/17 1839 94 %     Weight 12/10/17 1840 270 lb (122.5 kg)     Height 12/10/17 1840 5' 11"  (1.803 m)     Head Circumference --      Peak Flow --      Pain Score 12/10/17 1840 0     Pain Loc --      Pain Edu? --      Excl. in [place]? --     Constitutional: Alert and oriented. Well appearing and in no apparent distress. HEENT:      Head: Normocephalic and atraumatic.         Eyes: Conjunctivae are normal. Sclera is non-icteric.       Mouth/Throat:  Mucous membranes are moist.       Neck: Supple with no signs of meningismus. Cardiovascular: Regular rate and rhythm. No murmurs, gallops, or rubs. 2+ symmetrical distal pulses are  present in all extremities. Elevated JVD. Respiratory: Mildly increased work of breathing, satting 94% on room air, patient has coarse rhonchi bilaterally  Gastrointestinal: Soft, non tender, and non distended with positive bowel sounds. No rebound or guarding. Musculoskeletal: 2+ pitting edema bilaterally Neurologic: Normal speech and language. Face is symmetric. Moving all extremities. No gross focal neurologic deficits are appreciated. Skin: Skin is warm, dry and intact. No rash noted. Psychiatric: Mood and affect are normal. Speech and behavior are normal.  ____________________________________________   LABS (all labs ordered are listed, but only abnormal results are displayed)  Labs Reviewed  CBC WITH DIFFERENTIAL/PLATELET - Abnormal; Notable for the following components:      Result Value   WBC 10.8 (*)    Hemoglobin 12.7 (*)    HCT 37.4 (*)    RDW 16.0 (*)    Neutro Abs 9.1 (*)    Lymphs Abs 0.8 (*)    All other components within normal limits  BASIC METABOLIC PANEL - Abnormal; Notable for the following components:   Sodium 133 (*)    Chloride 98 (*)    Glucose, Bld 226 (*)    BUN 21 (*)    Calcium 8.3 (*)    All other components within normal limits  BRAIN NATRIURETIC PEPTIDE - Abnormal; Notable for the following components:   B Natriuretic Peptide 887.0 (*)    All other components within normal limits  TROPONIN I   ____________________________________________  EKG  ED ECG REPORT I, Rudene Re, the attending physician, personally viewed and interpreted this ECG.  Normal sinus rhythm, rate of 92, first-degree AV block, normal QRS and QTc intervals, right axis deviation, no ST elevations or depressions. Unchanged from prior   ____________________________________________  RADIOLOGY  I have personally reviewed the images performed during this visit and I agree with the Radiologist's read.   Interpretation by Radiologist:  Dg Chest 2 View  Result Date: 12/10/2017 CLINICAL DATA:  Shortness of breath EXAM: CHEST - 2 VIEW COMPARISON:  09/08/2016 FINDINGS: Cardiac shadow is enlarged. Defibrillator is again seen and stable. Diffuse vascular congestion is identified without significant interstitial edema. Some right basilar atelectasis is noted as well. IMPRESSION: Mild CHF with right basilar atelectasis. Electronically Signed   By: Inez Catalina M.D.   On: 12/10/2017 19:25     ____________________________________________   PROCEDURES  Procedure(s) performed: None Procedures Critical Care performed:  Yes  CRITICAL CARE Performed by: Rudene Re  ?  Total critical care time: 30 min  Critical care time was exclusive of separately billable procedures and treating other patients.  Critical care was necessary to treat or prevent imminent or life-threatening deterioration.  Critical care was time spent personally by me on the following activities: development of treatment plan with patient and/or surrogate as well as nursing, discussions with consultants, evaluation of patient's response to treatment, examination of patient, obtaining history from patient or surrogate, ordering and performing treatments and interventions, ordering and review of laboratory studies, ordering and review of radiographic studies, pulse oximetry and re-evaluation of patient's condition.  ____________________________________________   INITIAL IMPRESSION / ASSESSMENT AND PLAN / ED COURSE  67 y.o. male with a history of CHF with EF of 20 to 25% (2015), atrial flutter on Xarelto, CAD status post stents, OSA, diabetes, hypertension, hyperlipidemia who presents for evaluation of shortness of breath and cough.  Patient feels improved  after albuterol.  He does look volume overloaded with 2+ pitting edema and elevated JVD.  His lungs sound coarse.  Patient has  no oxygen requirement this time.  Differential diagnoses including bronchitis versus COPD exacerbation versus pneumonia versus CHF.  Will check CBC, BMP, BNP, troponin, EKG, and chest x-ray.  Will give DuoNeb's since patient felt better and steroids.    _________________________ 7:58 PM on 12/10/2017 -----------------------------------------  Labs and imaging consistent with CHF and COPD exacerbation. Patient given duoneb x 3, solumedrol, augmentin, and lasix 52m. Plan to reassess after patient diureses and get ambulatory sats. If patient feels better and has no oxygen requirement he will be discharged home on steroids, Augmentin, albuterol inhaler, and will increase his dose of diuretics for the next 2 days.  Recommend close follow-up with primary care doctor for recheck.  If patient continues to have new oxygen requirement or continues to have increased work of breathing plan to admit to the hospitalist service.  Care transferred to Dr. GArchie Balboa  As part of my medical decision making, I reviewed the following data within the eCyrilnotes reviewed and incorporated, Labs reviewed , EKG interpreted , Old EKG reviewed, Old chart reviewed, Radiograph reviewed , Notes from prior ED visits and [place] Controlled Substance Database    Pertinent labs & imaging results that were available during my care of the patient were reviewed by me and considered in my medical decision making (see chart for details).    ____________________________________________   FINAL CLINICAL IMPRESSION(S) / ED DIAGNOSES  Final diagnoses:  COPD exacerbation ([place]Glass[place]  Acute on chronic congestive heart failure, unspecified heart failure type ([place]      NEW MEDICATIONS STARTED DURING THIS VISIT:  ED Discharge Orders        Ordered    amoxicillin-clavulanate (AUGMENTIN)  875-125 MG tablet  2 times daily     12/10/17 2000    albuterol (PROVENTIL HFA;VENTOLIN HFA) 108 (90 Base) MCG/ACT inhaler  Every 6 hours PRN     12/10/17 2000    predniSONE (DELTASONE) 20 MG tablet  Daily     12/10/17 2000       Note:  This document was prepared using Dragon voice recognition software and may include unintentional dictation errors.    VAlfred Levins CKentucky MD 12/10/17 2Merlene Morse CK[place] MD 12/18/17 [phone]

## 2017-12-10 NOTE — ED Triage Notes (Signed)
Pt arrived via EMS from home with complaints of SOB. Pt stated that he has had SOB since Thursday but it became increasingly worse today. EMS placed a 22 in left hand. EMS gave a total of 34m Albuterol. VS per EMS BP-130/90 HR-108 R-22 O2sat-95%RA. Pt states that he is not normally on oxygen at home. PT is alert and oriented x 4 and denies any pain at this time.

## 2018-01-04 ENCOUNTER — Other Ambulatory Visit: Payer: Self-pay

## 2018-01-04 MED ORDER — ATORVASTATIN CALCIUM 80 MG PO TABS: ORAL_TABLET | ORAL | 3 refills | Status: DC

## 2018-01-21 ENCOUNTER — Other Ambulatory Visit: Payer: Self-pay | Admitting: Cardiovascular Disease

## 2018-01-24 ENCOUNTER — Other Ambulatory Visit: Payer: Self-pay

## 2018-01-24 NOTE — Telephone Encounter (Signed)
*  STAT* If patient is at the pharmacy, call can be transferred to refill team.   1. Which medications need to be refilled? (please list name of each medication and dose if known) [place]-Con  2. Which pharmacy/location (including street and city if local pharmacy) is medication to be sent to? [facility] [place]  3. Do they need a 30 day or 90 day supply? [place]

## 2018-01-24 NOTE — Telephone Encounter (Signed)
Medication refilled 01/22/2018  potassium chloride (KLOR-CON 10) 10 MEQ tablet 90 tablet 0 01/22/2018    Sig - Route: Take 1 tablet (10 mEq total) by mouth daily. - Oral   Sent to pharmacy as: potassium chloride (KLOR-CON 10) 10 MEQ tablet   E-Prescribing Status: Receipt confirmed by pharmacy (01/22/2018 8:15 AM EDT)   Pharmacy   [id number] [place] [place]

## 2018-01-25 ENCOUNTER — Encounter: Payer: Self-pay | Admitting: Cardiology

## 2018-01-25 ENCOUNTER — Ambulatory Visit (INDEPENDENT_AMBULATORY_CARE_PROVIDER_SITE_OTHER): Payer: Medicare Other | Admitting: *Deleted

## 2018-01-25 DIAGNOSIS — I255 Ischemic cardiomyopathy: Secondary | ICD-10-CM

## 2018-01-25 NOTE — Progress Notes (Signed)
Remote ICD transmission.   

## 2018-01-26 LAB — CUP PACEART REMOTE DEVICE CHECK
Brady Statistic AP VP Percent: 0 %
Brady Statistic AS VP Percent: 0.04 %
Brady Statistic AS VS Percent: 99.6 %
Date Time Interrogation Session: 20190627062723
HighPow Impedance: 70 Ohm
Implantable Lead Implant Date: 20150916
Implantable Lead Implant Date: 20150916
Implantable Lead Model: 6935
Lead Channel Impedance Value: 399 Ohm
Lead Channel Pacing Threshold Pulse Width: 0.4 ms
Lead Channel Pacing Threshold Pulse Width: 0.4 ms
Lead Channel Sensing Intrinsic Amplitude: 1.625 mV
Lead Channel Sensing Intrinsic Amplitude: 1.625 mV
Lead Channel Sensing Intrinsic Amplitude: 4.875 mV
Lead Channel Sensing Intrinsic Amplitude: 4.875 mV
Lead Channel Setting Pacing Amplitude: 1.5 V
Lead Channel Setting Pacing Pulse Width: 0.4 ms
MDC IDC LEAD LOCATION: 753859
MDC IDC LEAD LOCATION: 753860
MDC IDC MSMT BATTERY REMAINING LONGEVITY: 91 mo
MDC IDC MSMT BATTERY VOLTAGE: 2.95 V
MDC IDC MSMT LEADCHNL RA PACING THRESHOLD AMPLITUDE: 0.75 V
MDC IDC MSMT LEADCHNL RV IMPEDANCE VALUE: 266 Ohm
MDC IDC MSMT LEADCHNL RV IMPEDANCE VALUE: 323 Ohm
MDC IDC MSMT LEADCHNL RV PACING THRESHOLD AMPLITUDE: 1.25 V
MDC IDC PG IMPLANT DT: 20150916
MDC IDC SET LEADCHNL RV PACING AMPLITUDE: 2.5 V
MDC IDC SET LEADCHNL RV SENSING SENSITIVITY: 0.3 mV
MDC IDC STAT BRADY AP VS PERCENT: 0.36 %
MDC IDC STAT BRADY RA PERCENT PACED: 0.36 %
MDC IDC STAT BRADY RV PERCENT PACED: 0.05 %

## 2018-01-30 ENCOUNTER — Telehealth: Payer: Self-pay | Admitting: Cardiovascular Disease

## 2018-01-30 NOTE — Telephone Encounter (Signed)
Attempted to call patient and schedule appointment  It rang a few times and then went busy  Will try again at a later time

## 2018-01-30 NOTE — Telephone Encounter (Signed)
-----   Message from Valora Corporal, RN sent at 01/24/2018  3:04 PM EDT ----- Contact: [phone] I am sending this to our scheduling pool to help him with this. Hope you had a great vacation and let me know if you need anything further.  Thanks ----- Message ----- From: Gaylord Shih, CMA Sent: 01/24/2018  10:41 AM To: Valora Corporal, RN  Patient called our office by mistake asking for someone to call him back about making an appointment to refill his medications. Can you please contact the patient.  Thank you.

## 2018-02-03 ENCOUNTER — Other Ambulatory Visit: Payer: Self-pay | Admitting: Cardiovascular Disease

## 2018-02-05 NOTE — Telephone Encounter (Signed)
Refill Request.  

## 2018-02-06 NOTE — Telephone Encounter (Signed)
If patient calls back, need to verify which medications to refill & the pharmacy of preference.

## 2018-02-06 NOTE — Telephone Encounter (Signed)
Pt calling stating it was his Potasium  [facility] [place]  But also states it is already been filled.

## 2018-02-06 NOTE — Telephone Encounter (Signed)
Pt scheduled for 04/13/18

## 2018-02-06 NOTE — Telephone Encounter (Signed)
Patients medication has already been refilled.   potassium chloride (KLOR-CON 10) 10 MEQ tablet 90 tablet 0 01/22/2018    Sig - Route: Take 1 tablet (10 mEq total) by mouth daily. - Oral   Sent to pharmacy as: potassium chloride (KLOR-CON 10) 10 MEQ tablet   E-Prescribing Status: Receipt confirmed by pharmacy (01/22/2018 8:15 AM EDT)   Pharmacy   [id number] [place] [place]

## 2018-02-14 ENCOUNTER — Encounter: Payer: Self-pay | Admitting: Podiatry

## 2018-02-14 ENCOUNTER — Ambulatory Visit (INDEPENDENT_AMBULATORY_CARE_PROVIDER_SITE_OTHER): Payer: Medicare Other | Admitting: Podiatry

## 2018-02-14 VITALS — BP 121/77 | HR 67 | Resp 16

## 2018-02-14 DIAGNOSIS — I255 Ischemic cardiomyopathy: Secondary | ICD-10-CM

## 2018-02-14 DIAGNOSIS — L603 Nail dystrophy: Secondary | ICD-10-CM | POA: Diagnosis not present

## 2018-02-14 NOTE — Progress Notes (Signed)
Subjective:  Patient ID: Colton Lamb, male    DOB: 05/29/1951,  MRN: [id number] HPI Chief Complaint  Patient presents with  . Nail Problem    Hallux nail right - thick and discolored x years, sore at times, gets nails cut regularly at salon, they recommended visit  . New Patient (Initial Visit)    67 y.o. male presents with the above complaint.   ROS: Denies fever chills nausea vomiting muscle aches pains calf pain back pain chest pain shortness of breath.  Past Medical History:  Diagnosis Date  . Atrial flutter ([place])    a. s/p Cardioversion 11/22/13, on amiodarone and Xarelto.  . CHF (congestive heart failure) ([place])   . Chronic systolic heart failure ([place])    a. 10/2013 EF 20-25%, grade III DD, RV mildly dilated and sys fx mild/mod reduced;  b. 01/2014 Echo: EF 30-35%, gr3 DD, mod dil LA.  Marland Kitchen Coronary artery disease    a. s/p MI 2007/2015;  b. s/p prior PCI to the LAD/LCX/PDA/PL;  c. 2008: s/p Cypher DES to the OM.  Marland Kitchen Crohn's ileocolitis ([place])   . GERD (gastroesophageal reflux disease)   . Hx of adenomatous colonic polyps 11/2003  . Hyperlipidemia   . Hypertension   . Ischemic cardiomyopathy    s. 01/2014 s/p MDT [id number] [name] XT DR single lead AICD.  Marland Kitchen Obesity   . Paroxysmal atrial fibrillation (HCC)    a. CHA2DS2VASc = 4-->xarelto/amio.  . Sleep apnea   . Syncope    a.  11/2013 in setting of volume depletion and bradycardia due to dig toxicity   . Type II diabetes mellitus ([place])    Past Surgical History:  Procedure Laterality Date  . ATRIAL FLUTTER ABLATION N/A 04/16/2014   Procedure: ATRIAL FLUTTER ABLATION;  Surgeon: Evans Lance, MD;  Location: [facility] CATH LAB;  Service: Cardiovascular;  Laterality: N/A;  . CARDIAC CATHETERIZATION  10/2013  . CARDIAC DEFIBRILLATOR PLACEMENT  04/16/2014   Medtronic Evira device  . CARDIAC ELECTROPHYSIOLOGY STUDY AND ABLATION  04/16/2014   atrial flutter ablation  . CARDIOVERSION N/A 03/05/2014   Procedure: CARDIOVERSION;  Surgeon: Jolaine Artist, MD;  Location: [facility] ENDOSCOPY;  Service: Cardiovascular;  Laterality: N/A;  . CATARACT EXTRACTION W/PHACO Right 01/04/2017   Procedure: CATARACT EXTRACTION PHACO AND INTRAOCULAR LENS PLACEMENT ([place])  Right Diabetic Complicated;  Surgeon: Leandrew Koyanagi, MD;  Location: [place];  Service: Ophthalmology;  Laterality: Right;  Diabetic  . CATARACT EXTRACTION W/PHACO Left 02/08/2017   Procedure: CATARACT EXTRACTION PHACO AND INTRAOCULAR LENS PLACEMENT ([place]) left diabetic;  Surgeon: Leandrew Koyanagi, MD;  Location: [place];  Service: Ophthalmology;  Laterality: Left;  Diabetic - oral meds sleep apnea  . CORONARY ANGIOPLASTY WITH STENT PLACEMENT  2007; 2008 X 2   "1+1 ~ 1"  . FOOT SURGERY Left    bone spur  . HYDROCELE EXCISION Bilateral   . 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 IMPLANTABLE CARDIOVERTER DEFIBRILLATOR IMPLANT N/A 04/16/2014   Procedure: IMPLANTABLE CARDIOVERTER DEFIBRILLATOR IMPLANT;  Surgeon: Evans Lance, MD;  Location[facility] C[facility] LAB;  Service: Cardiovascular;  Laterality: N/A;  . LEFT HEART CATHETERIZATION WITH CORONARY ANGIOGRAM N/A 11/22/2013   Procedure: LEFT HEART CATHETERIZATION WITH CORONARY ANGIOGRAM;  Surgeon: Sinclair Grooms, MD;  Location: [facility] CATH LAB;  Service: Cardiovascular;  Laterality: N/A;    Current Outpatient Medications:  .  acetaminophen (TYLENOL) 325 MG tablet, Take 1-2 tablets (325-650 mg total) by mouth every 4 (four)  hours as needed for mild pain., Disp: , Rfl:  .  albuterol (PROVENTIL HFA;VENTOLIN HFA) 108 (90 Base) MCG/ACT inhaler, Inhale 2 puffs into the lungs every 6 (six) hours as needed for wheezing or shortness of breath., Disp: 1 Inhaler, Rfl: 2 .  amiodarone (PACERONE) 200 MG tablet, TAKE 1 TABLET BY MOUTH MON.-FRIDAY AND 1/2 TABLET BY MOUTH SATURDAY AND SUNDAY., Disp: 30 tablet, Rfl: 11 .  aspirin 81 MG tablet, Take 81 mg by mouth daily. , Disp: , Rfl:  .  atorvastatin  (LIPITOR) 80 MG tablet, TAKE 1 TABLET (80 MG TOTAL) BY MOUTH DAILY., Disp: 90 tablet, Rfl: 3 .  balsalazide (COLAZAL) 750 MG capsule, TAKE 1 CAPSULE (750 MG TOTAL) BY MOUTH 3 (THREE) TIMES DAILY., Disp: 90 capsule, Rfl: 11 .  carvedilol (COREG) 12.5 MG tablet, TAKE 1 TABLET (12.5 MG TOTAL) BY MOUTH 2 (TWO) TIMES DAILY., Disp: 60 tablet, Rfl: 1 .  ENTRESTO 49-51 MG, TAKE 1 TABLET BY MOUTH 2 (TWO) TIMES DAILY., Disp: 60 tablet, Rfl: 3 .  meclizine (ANTIVERT) 25 MG tablet, Take 1 tablet (25 mg total) by mouth 3 (three) times daily as needed. (Patient taking differently: Take 25 mg by mouth 3 (three) times daily as needed for dizziness. ), Disp: 60 tablet, Rfl: 1 .  metFORMIN (GLUCOPHAGE) 1000 MG tablet, Take 1,000 mg by mouth 2 (two) times daily with a meal. , Disp: , Rfl:  .  niacin (NIASPAN) 1000 MG CR tablet, TAKE 1 TABLET BY MOUTH AT BEDTIME, Disp: 30 tablet, Rfl: 1 .  nitroGLYCERIN (NITROSTAT) 0.4 MG SL tablet, Place 0.4 mg under the tongue every 5 (five) minutes as needed (Breton Berns 3 TABLETS). FOR CHEST PAIN, Disp: , Rfl:  .  omeprazole (PRILOSEC) 20 MG capsule, Take 20 mg by mouth daily. , Disp: , Rfl:  .  potassium chloride (KLOR-CON 10) 10 MEQ tablet, Take 1 tablet (10 mEq total) by mouth daily., Disp: 90 tablet, Rfl: 0 .  torsemide (DEMADEX) 20 MG tablet, TAKE 1 TABLET (20 MG TOTAL) BY MOUTH 2 (TWO) TIMES DAILY., Disp: 180 tablet, Rfl: 0 .  XARELTO 20 MG TABS tablet, TAKE 1 TABLET BY MOUTH EVERY DAY WITH LUNCH, Disp: 30 tablet, Rfl: 5  Allergies  Allergen Reactions  . Iodine Other (See Comments)    Shortness of breath, swelling and hives  . Shrimp [Shellfish Allergy] Other (See Comments)    SWELLING    HIVES    SHORTNESS OF BREATH  . Tetracycline Rash   Review of Systems Objective:   Vitals:   02/14/18 0857  BP: 121/77  Pulse: 67  Resp: 16    General: Well developed, nourished, in no acute distress, alert and oriented x3   Dermatological: Skin is warm, dry and supple bilateral.  Nails x 10 are well maintained; remaining integument appears unremarkable at this time. There are no open sores, no preulcerative lesions, no rash or signs of infection present.  Vascular: Dorsalis Pedis artery and Posterior Tibial artery pedal pulses are 2/4 bilateral with immedate capillary fill time. Pedal hair growth present. No varicosities and no lower extremity edema present bilateral.   Neruologic: Grossly intact via light touch bilateral. Vibratory intact via tuning fork bilateral. Protective threshold with Semmes Wienstein monofilament intact to all pedal sites bilateral. Patellar and Achilles deep tendon reflexes 2+ bilateral. No Babinski or clonus noted bilateral.   Musculoskeletal: No gross boney pedal deformities bilateral. No pain, crepitus, or limitation noted with foot and ankle range of motion bilateral. Muscular strength 5/5  in all groups tested bilateral.  Gait: Unassisted, Nonantalgic.    Radiographs:  None taken  Assessment & Plan:   Assessment: Nail dystrophy hallux right with diabetes.  Plan: Discussed etiology pathology conservative versus surgical therapies discussed removing the nail in total today however at this point because of his blood sugar being on 9 like to consider sampling the nail for treatment.  Samples of the nail and skin were taken today and I will follow-up with him once they have arrived from pathology.     Glendene Wyer T. [place], [place]

## 2018-02-26 ENCOUNTER — Other Ambulatory Visit: Payer: Self-pay | Admitting: Cardiovascular Disease

## 2018-03-02 ENCOUNTER — Other Ambulatory Visit: Payer: Self-pay | Admitting: Cardiovascular Disease

## 2018-03-14 ENCOUNTER — Ambulatory Visit (INDEPENDENT_AMBULATORY_CARE_PROVIDER_SITE_OTHER): Payer: Medicare Other | Admitting: Podiatry

## 2018-03-14 ENCOUNTER — Encounter: Payer: Self-pay | Admitting: Podiatry

## 2018-03-14 DIAGNOSIS — I255 Ischemic cardiomyopathy: Secondary | ICD-10-CM | POA: Diagnosis not present

## 2018-03-14 DIAGNOSIS — L603 Nail dystrophy: Secondary | ICD-10-CM

## 2018-03-14 DIAGNOSIS — Z79899 Other long term (current) drug therapy: Secondary | ICD-10-CM

## 2018-03-14 MED ORDER — NEOMYCIN-POLYMYXIN-HC 1 % OT SOLN: OTIC | 0 refills | Status: DC

## 2018-03-14 MED ORDER — TERBINAFINE HCL 250 MG PO TABS: 250.0000 mg | ORAL_TABLET | Freq: Every day | ORAL | 0 refills | Status: DC

## 2018-03-14 NOTE — Progress Notes (Signed)
He presents today for follow-up of his nail fungus.  Objective: Pathology does demonstrate nail fungus and Pseudomonas.  Assessment: Onychomycosis and bacterial infection.  Plan: Discussed etiology pathology and surgical therapies after long discussion pros and cons of medications he is going to try oral medication.  We are going to request a liver profile and start him on Lamisil 250 mg tablets 1 p.o. daily for 30 days follow-up with him in 30 days for another set of blood work.  Should blood work come back abnormal at the time we will notify me immediately.  Also we also wrote a prescription for Cortisporin Otic drops to be applied twice daily.

## 2018-03-15 LAB — HEPATIC FUNCTION PANEL
ALT: 17 IU/L (ref 0–44)
AST: 20 IU/L (ref 0–40)
Albumin: 4.2 g/dL (ref 3.6–4.8)
Alkaline Phosphatase: 161 IU/L — ABNORMAL HIGH (ref 39–117)
BILIRUBIN TOTAL: 0.7 mg/dL (ref 0.0–1.2)
BILIRUBIN, DIRECT: 0.22 mg/dL (ref 0.00–0.40)
TOTAL PROTEIN: 6.9 g/dL (ref 6.0–8.5)

## 2018-03-19 ENCOUNTER — Telehealth: Payer: Self-pay | Admitting: *Deleted

## 2018-03-19 NOTE — Telephone Encounter (Signed)
Left message informing pt the prescription was to be for his toe in had and antibiotic and antiinflammatory medication.

## 2018-03-19 NOTE — Telephone Encounter (Signed)
Pt states he was given a prescription for his ear not his toe.

## 2018-03-26 ENCOUNTER — Other Ambulatory Visit: Payer: Self-pay | Admitting: Cardiovascular Disease

## 2018-03-29 ENCOUNTER — Other Ambulatory Visit: Payer: Self-pay | Admitting: Cardiovascular Disease

## 2018-04-10 NOTE — Progress Notes (Signed)
Cardiology Office Note  Date:  04/13/2018   ID:  Colton Lamb, DOB 12/07/1950, MRN [id number]  PCP:  Sherrin Daisy, MD   Chief Complaint  Patient presents with  . other    12 month follow up. Meds reviewed by the pt. verbally. Pt. c/o some shortness of breath with LE edema during the day but by the evening is better.     HPI:  Colton Lamb is a 67 year old gentleman with a history of  paroxysmal atrial fibrillation,   hyperlipidemia,  Crohn's ileocolitis,  coronary artery disease, PCI x3 to the LAD, circumflex, PDA and PL branch, catheterization in 2008  with a cypher stent to the OM,  Ejection fraction 30-35% in July 2015 by echo sleep apnea, has not been using CPAP poorly controlled diabetes, little follow-up with his primary care S/p catheter ablation of atrial flutter and insertion of a DDD ICD. no syncope or ICD shock. He presents today for follow-up of his coronary artery disease, atrial fibrillation  Weight up 14 pounds "eating the same" Cant walk, "too hot" Worsening lower extremity edema Denies abdominal bloating or swelling Does not feel he is short of breath, no PND orthopnea  Eats out daily Lots of water  No recent lab work available Previous lab work reviewed from earlier in 2019 HBA1C 6.5, down from HBA1C 15 Total chol 106, LDL 58  EKG personally reviewed by myself on todays visit Shows normal sinus rhythm rate 69 bpm poor R wave progression through the anterior precordial leads  Other past medical history reviewed  in the ER in May 2017, stomach bug, N/V, diarrhea Potassium noted to be mildly low at that time  February had episodes of Vertigo, dry heaves Multiple epsiodes Admitted to hospital 09/04/2016 N/V Potassium 2.8 Improved up to 3.9 on recheck  Admitted in April 2015 with atrial flutter, non-ST elevation MI, with cardioversion, cardiac catheterization showing occluded LAD,  moderate distal RCA disease, patent left circumflex.. Medical  management recommended.  Severely depressed ejection fraction noted, LifeVest was placed at that time. Ejection fraction 20-25%   hospital admission May 13 with discharge 12/14/2013 with dig toxicity, junctional rhythm, bradycardia with heart rates in the 30s, syncope.  Acute renal failure likely secondary to ATN. Also noted to have TSH of 10. ACE inhibitor, spironolactone, isosorbide was held. Renal function improved at the time of discharge  Followup echocardiogram 01/29/2014 showing improved ejection fraction up to 30-35%   PMH:   has a past medical history of Atrial flutter ([place]), CHF (congestive heart failure) ([place]), Chronic systolic heart failure ([place]), Coronary artery disease, Crohn's ileocolitis ([place]), GERD (gastroesophageal reflux disease), adenomatous colonic polyps (11/2003), Hyperlipidemia, Hypertension, Ischemic cardiomyopathy, Obesity, Paroxysmal atrial fibrillation ([place]), Sleep apnea, Syncope, and Type II diabetes mellitus ([place]).  PSH:    Past Surgical History:  Procedure Laterality Date  . ATRIAL FLUTTER ABLATION N/A 04/16/2014   Procedure: ATRIAL FLUTTER ABLATION;  Surgeon: Evans Lance, MD;  Location: [facility] CATH LAB;  Service: Cardiovascular;  Laterality: N/A;  . CARDIAC CATHETERIZATION  10/2013  . CARDIAC DEFIBRILLATOR PLACEMENT  04/16/2014   Medtronic Evira device  . CARDIAC ELECTROPHYSIOLOGY STUDY AND ABLATION  04/16/2014   atrial flutter ablation  . CARDIOVERSION N/A 03/05/2014   Procedure: CARDIOVERSION;  Surgeon: Jolaine Artist, MD;  Location: [facility] ENDOSCOPY;  Service: Cardiovascular;  Laterality: N/A;  . CATARACT EXTRACTION W/PHACO Right 01/04/2017   Procedure: CATARACT EXTRACTION PHACO AND INTRAOCULAR LENS PLACEMENT ([place])  Right Diabetic Complicated;  Surgeon: Leandrew Koyanagi, MD;  Location:  [place];  Service: Ophthalmology;  Laterality: Right;  Diabetic  . CATARACT EXTRACTION W/PHACO Left 02/08/2017   Procedure: CATARACT EXTRACTION PHACO AND  INTRAOCULAR LENS PLACEMENT ([place]) left diabetic;  Surgeon: Leandrew Koyanagi, MD;  Location: [place];  Service: Ophthalmology;  Laterality: Left;  Diabetic - oral meds sleep apnea  . CORONARY ANGIOPLASTY WITH STENT PLACEMENT  2007; 2008 X 2   "1+1 ~ 1"  . FOOT SURGERY Left    bone spur  . HYDROCELE EXCISION Bilateral   . 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 IMPLANTABLE CARDIOVERTER DEFIBRILLATOR IMPLANT N/A 04/16/2014   Procedure: IMPLANTABLE CARDIOVERTER DEFIBRILLATOR IMPLANT;  Surgeon: Evans Lance, MD;  Location[facility] C[facility] LAB;  Service: Cardiovascular;  Laterality: N/A;  . LEFT HEART CATHETERIZATION WITH CORONARY ANGIOGRAM N/A 11/22/2013   Procedure: LEFT HEART CATHETERIZATION WITH CORONARY ANGIOGRAM;  Surgeon: Sinclair Grooms, MD;  Location: [facility] CATH LAB;  Service: Cardiovascular;  Laterality: N/A;    Current Outpatient Medications  Medication Sig Dispense Refill  . acetaminophen (TYLENOL) 325 MG tablet Take 1-2 tablets (325-650 mg total) by mouth every 4 (four) hours as needed for mild pain.    Marland Kitchen albuterol (PROVENTIL HFA;VENTOLIN HFA) 108 (90 Base) MCG/ACT inhaler Inhale 2 puffs into the lungs every 6 (six) hours as needed for wheezing or shortness of breath. 1 Inhaler 2  . amiodarone (PACERONE) 200 MG tablet TAKE 1 TABLET BY MOUTH MON.-FRIDAY AND 1/2 TABLET BY MOUTH SATURDAY AND SUNDAY. 30 tablet 11  . aspirin 81 MG tablet Take 81 mg by mouth daily.     Marland Kitchen atorvastatin (LIPITOR) 80 MG tablet TAKE 1 TABLET (80 MG TOTAL) BY MOUTH DAILY. 90 tablet 3  . balsalazide (COLAZAL) 750 MG capsule TAKE 1 CAPSULE (750 MG TOTAL) BY MOUTH 3 (THREE) TIMES DAILY. 90 capsule 11  . carvedilol (COREG) 12.5 MG tablet TAKE 1 TABLET (12.5 MG TOTAL) BY MOUTH 2 (TWO) TIMES DAILY. 60 tablet 0  . ENTRESTO 49-51 MG TAKE 1 TABLET BY MOUTH 2 (TWO) TIMES DAILY. 60 tablet 3  . metFORMIN (GLUCOPHAGE) 1000 MG tablet Take 1,000 mg by mouth 2 (two) times daily with a meal.      . NEOMYCIN-POLYMYXIN-HYDROCORTISONE (CORTISPORIN) 1 % SOLN OTIC solution Apply to affected nails 2 times daily 10 mL 0  . niacin (NIASPAN) 1000 MG CR tablet TAKE 1 TABLET BY MOUTH AT BEDTIME 30 tablet 3  . nitroGLYCERIN (NITROSTAT) 0.4 MG SL tablet Place 0.4 mg under the tongue every 5 (five) minutes as needed (MAX 3 TABLETS). FOR CHEST PAIN    . omeprazole (PRILOSEC) 20 MG capsule Take 20 mg by mouth daily.     . potassium chloride (KLOR-CON 10) 10 MEQ tablet Take 1 tablet (10 mEq total) by mouth daily. 90 tablet 0  . terbinafine (LAMISIL) 250 MG tablet Take 1 tablet (250 mg total) by mouth daily. 30 tablet 0  . torsemide (DEMADEX) 20 MG tablet TAKE 1 TABLET (20 MG TOTAL) BY MOUTH 2 (TWO) TIMES DAILY. 180 tablet 0  . XARELTO 20 MG TABS tablet TAKE 1 TABLET BY MOUTH EVERY DAY WITH LUNCH 30 tablet 5   No current facility-administered medications for this visit.      Allergies:   Iodine; Shrimp [shellfish allergy]; and Tetracycline   Social History:  The patient  reports that he has never smoked. He has never used smokeless tobacco. He reports that he does not drink alcohol or use drugs.   Family History:  family history includes Breast cancer in his mother; Heart attack in his father; Heart disease in his father.    Review of Systems: Review of Systems  Constitutional: Negative.   Respiratory: Negative.   Cardiovascular: Positive for leg swelling.  Gastrointestinal: Negative.   Musculoskeletal: Negative.   Neurological: Negative.   Psychiatric/Behavioral: Negative.   All other systems reviewed and are negative.   PHYSICAL EXAM: VS:  BP 118/70 (BP Location: Left Arm, Patient Position: Sitting, Cuff Size: Normal)   Pulse 69   Ht 5' 11"  (1.803 m)   Wt 284 lb 8 oz (129 kg)   BMI 39.68 kg/m  , BMI Body mass index is 39.68 kg/m. No changes compared to previous office visit GEN: Well nourished, well developed, in no acute distress, obese  HEENT: normal  Neck: no JVD, carotid  bruits, or masses Cardiac: RRR; no murmurs, rubs, or gallops,no edema  Respiratory:  clear to auscultation bilaterally, normal work of breathing GI: soft, nontender, nondistended, + BS MS: no deformity or atrophy  Skin: warm and dry, no rash Neuro:  Strength and sensation are intact Psych: euthymic mood, full affect   Recent Labs: 12/10/2017: B Natriuretic Peptide 887.0; BUN 21; Creatinine, Ser 1.02; Hemoglobin 12.7; Platelets 189; Potassium 4.0; Sodium 133 03/14/2018: ALT 17    Lipid Panel Lab Results  Component Value Date   CHOL 112 05/09/2016   HDL 35 (L) 05/09/2016   LDLCALC 57 05/09/2016   TRIG 100 05/09/2016      Wt Readings from Last 3 Encounters:  04/13/18 284 lb 8 oz (129 kg)  12/10/17 270 lb (122.5 kg)  05/23/17 269 lb (122 kg)     ASSESSMENT AND PLAN:  Paroxysmal atrial fibrillation (HCC) - Maintaining normal sinus rhythm, on amiodarone, anticoagulation Denies any symptoms concerning for paroxysmal episodes No changes to his regimen  Cardiomyopathy, ischemic - Previous ejection fraction 35% Previously declined echocardiogram for follow-up continue carvedilol, torsemide, entresto.  We will increase his torsemide given 14 pound weight gain and lower extremity edema  Chronic systolic heart failure ([place]) -above, reports he is stable  Recommended torsemide 40 twice daily for 3 days then alternate 20 twice daily with 40 twice daily Extra potassium on days with extra torsemide Recommend he call us if weight does not improve Once weight close to his baseline we would recommend a BMP.  It was discussed with him and he will call our office  Hyperlipidemia, unspecified hyperlipidemia type - Lab work through primary care   Type 2 diabetes mellitus without complication, with long-term current use of insulin ([place]) - Plan: HgB A1c dramatically improved Still eats out at least daily Strongly recommended he try to cut back on his carbohydrates  Vertigo This has not  been an issue   Total encounter time more than 25 minutes  Greater than 50% was spent in counseling and coordination of care with the patient  Disposition:   F/U  6 months   No orders of the defined types were placed in this encounter.    Signed, Esmond Plants, M.D., Ph.D. 04/13/2018  [place], [place]

## 2018-04-13 ENCOUNTER — Encounter: Payer: Self-pay | Admitting: Cardiovascular Disease

## 2018-04-13 ENCOUNTER — Ambulatory Visit (INDEPENDENT_AMBULATORY_CARE_PROVIDER_SITE_OTHER): Payer: Medicare Other | Admitting: Cardiovascular Disease

## 2018-04-13 VITALS — BP 118/70 | HR 69 | Ht 71.0 in | Wt 284.5 lb

## 2018-04-13 DIAGNOSIS — I480 Paroxysmal atrial fibrillation: Secondary | ICD-10-CM | POA: Diagnosis not present

## 2018-04-13 DIAGNOSIS — I5022 Chronic systolic (congestive) heart failure: Secondary | ICD-10-CM

## 2018-04-13 DIAGNOSIS — E782 Mixed hyperlipidemia: Secondary | ICD-10-CM

## 2018-04-13 DIAGNOSIS — I255 Ischemic cardiomyopathy: Secondary | ICD-10-CM

## 2018-04-13 DIAGNOSIS — I25118 Atherosclerotic heart disease of native coronary artery with other forms of angina pectoris: Secondary | ICD-10-CM

## 2018-04-13 DIAGNOSIS — E1159 Type 2 diabetes mellitus with other circulatory complications: Secondary | ICD-10-CM

## 2018-04-13 DIAGNOSIS — Z95810 Presence of automatic (implantable) cardiac defibrillator: Secondary | ICD-10-CM

## 2018-04-13 MED ORDER — TORSEMIDE 20 MG PO TABS: 40.0000 mg | ORAL_TABLET | ORAL | 3 refills | Status: DC

## 2018-04-13 MED ORDER — POTASSIUM CHLORIDE ER 10 MEQ PO TBCR: 20.0000 meq | EXTENDED_RELEASE_TABLET | ORAL | 3 refills | Status: DC

## 2018-04-13 NOTE — Patient Instructions (Addendum)
Decrease you fluid intake  Goal weight 270 lbs   Medication Instructions:   Take torsemide 40 twice a day for three days in a row Then alternate 20 mg  twice a day with 40 mg twice day On days with more torsemide, take two potassium pills  Labwork:  No new labs needed  Testing/Procedures:  No further testing at this time   Follow-Up: It was a pleasure seeing you in the office today. Please call us if you have new issues that need to be addressed before your next appt.  [phone]  Your physician wants you to follow-up in: 6 months.  You will receive a reminder letter in the mail two months in advance. If you don't receive a letter, please call our office to schedule the follow-up appointment.  If you need a refill on your cardiac medications before your next appointment, please call your pharmacy.  For educational health videos Log in to : [email or web address] Or : [email or web address], password : [email or web address]

## 2018-04-18 ENCOUNTER — Encounter: Payer: Self-pay | Admitting: Podiatry

## 2018-04-18 ENCOUNTER — Ambulatory Visit (INDEPENDENT_AMBULATORY_CARE_PROVIDER_SITE_OTHER): Payer: Medicare Other | Admitting: Podiatry

## 2018-04-18 DIAGNOSIS — I255 Ischemic cardiomyopathy: Secondary | ICD-10-CM | POA: Diagnosis not present

## 2018-04-18 DIAGNOSIS — Z79899 Other long term (current) drug therapy: Secondary | ICD-10-CM | POA: Diagnosis not present

## 2018-04-18 DIAGNOSIS — L603 Nail dystrophy: Secondary | ICD-10-CM

## 2018-04-18 MED ORDER — TERBINAFINE HCL 250 MG PO TABS: 250.0000 mg | ORAL_TABLET | Freq: Every day | ORAL | 0 refills | Status: DC

## 2018-04-18 NOTE — Progress Notes (Signed)
He presents today after taking his first round of Lamisil and also using his Cortisporin Otic.  He states that his nail technician has stated that it appears that is growing out more clearly.  Objective: Vital signs are stable he is alert and oriented x3 he denies fever chills nausea vomiting muscle aches and pains and there are no changes in the nail plates as of yet.  Assessment: Onychomycosis.  Plan: Continue the use of Lamisil therapy and Cortisporin Otic.  Also requested another liver profile today we will send him for liver profile at [facility].  I will follow-up with him in 4 months dispensed another 90 days of medication today.  He will call with questions or concerns.  Should his blood work come back abnormal I will notify him immediately.

## 2018-04-19 LAB — HEPATIC FUNCTION PANEL
ALBUMIN: 4.3 g/dL (ref 3.6–4.8)
ALK PHOS: 156 IU/L — AB (ref 39–117)
ALT: 15 IU/L (ref 0–44)
AST: 19 IU/L (ref 0–40)
Bilirubin Total: 0.5 mg/dL (ref 0.0–1.2)
Bilirubin, Direct: 0.2 mg/dL (ref 0.00–0.40)
Total Protein: 6.9 g/dL (ref 6.0–8.5)

## 2018-04-20 ENCOUNTER — Telehealth: Payer: Self-pay | Admitting: *Deleted

## 2018-04-20 NOTE — Telephone Encounter (Signed)
Left message informing pt of Dr. Stephenie Acres review of results and orders.

## 2018-04-20 NOTE — Telephone Encounter (Signed)
-----   Message from Garrel Ridgel, [place] sent at 04/19/2018  6:44 AM EDT ----- Liver enzymes look good and may continue medication.

## 2018-04-21 ENCOUNTER — Other Ambulatory Visit: Payer: Self-pay | Admitting: Cardiovascular Disease

## 2018-04-23 ENCOUNTER — Other Ambulatory Visit: Payer: Self-pay | Admitting: Cardiovascular Disease

## 2018-04-26 ENCOUNTER — Ambulatory Visit (INDEPENDENT_AMBULATORY_CARE_PROVIDER_SITE_OTHER): Payer: Medicare Other | Admitting: *Deleted

## 2018-04-26 DIAGNOSIS — I255 Ischemic cardiomyopathy: Secondary | ICD-10-CM | POA: Diagnosis not present

## 2018-04-26 DIAGNOSIS — I5022 Chronic systolic (congestive) heart failure: Secondary | ICD-10-CM

## 2018-04-26 NOTE — Progress Notes (Signed)
Remote ICD transmission.   

## 2018-04-27 ENCOUNTER — Encounter: Payer: Self-pay | Admitting: Cardiology

## 2018-04-30 ENCOUNTER — Other Ambulatory Visit: Payer: Self-pay

## 2018-04-30 MED ORDER — BALSALAZIDE DISODIUM 750 MG PO CAPS: ORAL_CAPSULE | ORAL | 0 refills | Status: DC

## 2018-05-01 ENCOUNTER — Other Ambulatory Visit: Payer: Self-pay

## 2018-05-01 MED ORDER — BALSALAZIDE DISODIUM 750 MG PO CAPS: ORAL_CAPSULE | ORAL | 0 refills | Status: DC

## 2018-05-02 ENCOUNTER — Other Ambulatory Visit: Payer: Self-pay | Admitting: Internal Medicine

## 2018-05-15 LAB — CUP PACEART REMOTE DEVICE CHECK
Brady Statistic AP VP Percent: 0 %
Brady Statistic AS VP Percent: 0.04 %
Brady Statistic RA Percent Paced: 0.19 %
HighPow Impedance: 68 Ohm
Implantable Lead Implant Date: 20150916
Implantable Lead Location: 753860
Implantable Lead Model: 5076
Implantable Lead Model: 6935
Lead Channel Impedance Value: 323 Ohm
Lead Channel Sensing Intrinsic Amplitude: 1.625 mV
Lead Channel Sensing Intrinsic Amplitude: 5.125 mV
Lead Channel Sensing Intrinsic Amplitude: 5.125 mV
Lead Channel Setting Pacing Amplitude: 1.5 V
Lead Channel Setting Pacing Pulse Width: 0.4 ms
MDC IDC LEAD IMPLANT DT: 20150916
MDC IDC LEAD LOCATION: 753859
MDC IDC MSMT BATTERY REMAINING LONGEVITY: 86 mo
MDC IDC MSMT BATTERY VOLTAGE: 2.94 V
MDC IDC MSMT LEADCHNL RA IMPEDANCE VALUE: 380 Ohm
MDC IDC MSMT LEADCHNL RA PACING THRESHOLD AMPLITUDE: 0.75 V
MDC IDC MSMT LEADCHNL RA PACING THRESHOLD PULSEWIDTH: 0.4 ms
MDC IDC MSMT LEADCHNL RA SENSING INTR AMPL: 1.625 mV
MDC IDC MSMT LEADCHNL RV IMPEDANCE VALUE: 266 Ohm
MDC IDC MSMT LEADCHNL RV PACING THRESHOLD AMPLITUDE: 1.25 V
MDC IDC MSMT LEADCHNL RV PACING THRESHOLD PULSEWIDTH: 0.4 ms
MDC IDC PG IMPLANT DT: 20150916
MDC IDC SESS DTM: 20190926083723
MDC IDC SET LEADCHNL RV PACING AMPLITUDE: 2.5 V
MDC IDC SET LEADCHNL RV SENSING SENSITIVITY: 0.3 mV
MDC IDC STAT BRADY AP VS PERCENT: 0.18 %
MDC IDC STAT BRADY AS VS PERCENT: 99.77 %
MDC IDC STAT BRADY RV PERCENT PACED: 0.05 %

## 2018-05-16 ENCOUNTER — Other Ambulatory Visit: Payer: Self-pay | Admitting: Cardiovascular Disease

## 2018-05-27 ENCOUNTER — Other Ambulatory Visit: Payer: Self-pay | Admitting: Cardiovascular Disease

## 2018-05-28 NOTE — Telephone Encounter (Signed)
Please review for refill.  

## 2018-06-01 ENCOUNTER — Other Ambulatory Visit: Payer: Self-pay | Admitting: Cardiovascular Disease

## 2018-06-06 ENCOUNTER — Other Ambulatory Visit: Payer: Self-pay | Admitting: Internal Medicine

## 2018-06-06 NOTE — Telephone Encounter (Signed)
This is a [place] pt 

## 2018-07-20 ENCOUNTER — Other Ambulatory Visit: Payer: Self-pay | Admitting: Cardiovascular Disease

## 2018-07-26 ENCOUNTER — Ambulatory Visit (INDEPENDENT_AMBULATORY_CARE_PROVIDER_SITE_OTHER): Payer: Medicare Other

## 2018-07-26 DIAGNOSIS — I255 Ischemic cardiomyopathy: Secondary | ICD-10-CM

## 2018-07-26 LAB — CUP PACEART REMOTE DEVICE CHECK
Battery Remaining Longevity: 82 mo
Battery Voltage: 2.93 V
Brady Statistic AS VS Percent: 99.87 %
Date Time Interrogation Session: 20191226143117
HighPow Impedance: 68 Ohm
Implantable Lead Implant Date: 20150916
Implantable Lead Location: 753859
Implantable Lead Model: 6935
Implantable Pulse Generator Implant Date: 20150916
Lead Channel Impedance Value: 380 Ohm
Lead Channel Pacing Threshold Amplitude: 0.875 V
Lead Channel Pacing Threshold Pulse Width: 0.4 ms
Lead Channel Sensing Intrinsic Amplitude: 1.8 mV
Lead Channel Setting Pacing Amplitude: 2.5 V
Lead Channel Setting Pacing Pulse Width: 0.4 ms
Lead Channel Setting Sensing Sensitivity: 0.3 mV
MDC IDC LEAD IMPLANT DT: 20150916
MDC IDC LEAD LOCATION: 753860
MDC IDC MSMT LEADCHNL RA PACING THRESHOLD PULSEWIDTH: 0.4 ms
MDC IDC MSMT LEADCHNL RV IMPEDANCE VALUE: 266 Ohm
MDC IDC MSMT LEADCHNL RV IMPEDANCE VALUE: 323 Ohm
MDC IDC MSMT LEADCHNL RV PACING THRESHOLD AMPLITUDE: 1.25 V
MDC IDC MSMT LEADCHNL RV SENSING INTR AMPL: 4.9 mV
MDC IDC SET LEADCHNL RA PACING AMPLITUDE: 1.75 V
MDC IDC STAT BRADY AP VP PERCENT: 0 %
MDC IDC STAT BRADY AP VS PERCENT: 0.08 %
MDC IDC STAT BRADY AS VP PERCENT: 0.04 %
MDC IDC STAT BRADY RA PERCENT PACED: 0.08 %
MDC IDC STAT BRADY RV PERCENT PACED: 0.05 %

## 2018-07-26 NOTE — Progress Notes (Signed)
Remote ICD transmission.   

## 2018-08-13 ENCOUNTER — Ambulatory Visit: Payer: Medicare Other | Admitting: Podiatry

## 2018-08-22 ENCOUNTER — Encounter: Payer: Self-pay | Admitting: Podiatry

## 2018-08-22 ENCOUNTER — Ambulatory Visit (INDEPENDENT_AMBULATORY_CARE_PROVIDER_SITE_OTHER): Payer: Medicare Other | Admitting: Podiatry

## 2018-08-22 DIAGNOSIS — L603 Nail dystrophy: Secondary | ICD-10-CM | POA: Diagnosis not present

## 2018-08-22 MED ORDER — TERBINAFINE HCL 250 MG PO TABS: 250.0000 mg | ORAL_TABLET | Freq: Every day | ORAL | 0 refills | Status: DC

## 2018-08-22 NOTE — Patient Instructions (Signed)
Dr. Hyatt has sent over a refill for Lamisil to your pharmacy today. The instructions on your bottle will say "take 1 tablet daily", however, he would like for you to take one pill every other day. He will follow up with you in 3 months to re-evaluate your toenails. 

## 2018-08-22 NOTE — Progress Notes (Signed)
He presents today for follow-up of his nail fungus he states that he is doing very well completed 120 days of Lamisil.  He states that finally the start to grow out a little bit.  Objective: Toenails are long thick yellow dystrophic but appear to be growing from proximal to distal.  They appear to be clear.  Assessment: Well-healing onychomycosis.  Plan: Continue therapy with Lamisil tablets 1 tablet every other day for the next 2 months and I will follow-up with him in 3 months.

## 2018-08-28 ENCOUNTER — Other Ambulatory Visit: Payer: Self-pay | Admitting: Gastroenterology

## 2018-09-17 ENCOUNTER — Other Ambulatory Visit: Payer: Self-pay | Admitting: Cardiovascular Disease

## 2018-09-17 ENCOUNTER — Telehealth: Payer: Self-pay

## 2018-09-17 NOTE — Telephone Encounter (Signed)
-----   Message from Janan Ridge, [place] sent at 09/17/2018  9:06 AM EST ----- Regarding: Appointment Can you please contact patient and try to get him set up for a follow up appointment.  Patient is almost due for one.   Thank You!

## 2018-09-17 NOTE — Telephone Encounter (Signed)
lmov to schedule  °

## 2018-09-18 NOTE — Telephone Encounter (Signed)
Scheduled

## 2018-10-24 ENCOUNTER — Telehealth: Payer: Self-pay

## 2018-10-24 NOTE — Telephone Encounter (Signed)
Called patient to make them aware that Dr. Rockey Situ is going Video Visits or Telephone visits due to the [place].  Patient was not interested in either one and wanted to wait until this is over to see Dr. Rockey Situ.

## 2018-10-24 NOTE — Telephone Encounter (Signed)
Patient has not been screened and would not like a telephone visit or video visit.

## 2018-10-25 ENCOUNTER — Ambulatory Visit (INDEPENDENT_AMBULATORY_CARE_PROVIDER_SITE_OTHER): Payer: Medicare Other | Admitting: *Deleted

## 2018-10-25 ENCOUNTER — Other Ambulatory Visit: Payer: Self-pay

## 2018-10-25 DIAGNOSIS — I255 Ischemic cardiomyopathy: Secondary | ICD-10-CM | POA: Diagnosis not present

## 2018-10-25 LAB — CUP PACEART REMOTE DEVICE CHECK
Battery Voltage: 2.91 V
Brady Statistic AP VP Percent: 0 %
Brady Statistic AS VP Percent: 0.06 %
Brady Statistic AS VS Percent: 99.84 %
Brady Statistic RA Percent Paced: 0.1 %
Date Time Interrogation Session: 20200326083824
HighPow Impedance: 72 Ohm
Implantable Lead Implant Date: 20150916
Implantable Lead Implant Date: 20150916
Implantable Lead Location: 753859
Implantable Lead Location: 753860
Implantable Lead Model: 5076
Implantable Lead Model: 6935
Implantable Pulse Generator Implant Date: 20150916
Lead Channel Impedance Value: 266 Ohm
Lead Channel Impedance Value: 323 Ohm
Lead Channel Impedance Value: 399 Ohm
Lead Channel Pacing Threshold Amplitude: 0.875 V
Lead Channel Pacing Threshold Pulse Width: 0.4 ms
Lead Channel Pacing Threshold Pulse Width: 0.4 ms
Lead Channel Sensing Intrinsic Amplitude: 1.75 mV
Lead Channel Sensing Intrinsic Amplitude: 1.75 mV
Lead Channel Sensing Intrinsic Amplitude: 5 mV
Lead Channel Sensing Intrinsic Amplitude: 5 mV
Lead Channel Setting Pacing Amplitude: 1.75 V
Lead Channel Setting Pacing Amplitude: 2.75 V
Lead Channel Setting Pacing Pulse Width: 0.4 ms
Lead Channel Setting Sensing Sensitivity: 0.3 mV
MDC IDC MSMT BATTERY REMAINING LONGEVITY: 79 mo
MDC IDC MSMT LEADCHNL RV PACING THRESHOLD AMPLITUDE: 1.375 V
MDC IDC STAT BRADY AP VS PERCENT: 0.1 %
MDC IDC STAT BRADY RV PERCENT PACED: 0.06 %

## 2018-10-25 NOTE — Telephone Encounter (Signed)
Left voicemail message for patient to call back for review of appointment information and screening.

## 2018-10-29 ENCOUNTER — Ambulatory Visit: Payer: Medicare Other | Admitting: Cardiovascular Disease

## 2018-10-30 ENCOUNTER — Encounter: Payer: Self-pay | Admitting: Cardiology

## 2018-10-30 NOTE — Progress Notes (Signed)
Remote ICD transmission.   

## 2018-11-01 NOTE — Telephone Encounter (Signed)
Left voicemail message that I am calling to review appointment information. Advised that I would try to call back again and to please answer if it shows no caller ID or unknown caller.

## 2018-11-13 ENCOUNTER — Other Ambulatory Visit: Payer: Self-pay | Admitting: Cardiovascular Disease

## 2018-11-21 ENCOUNTER — Other Ambulatory Visit: Payer: Self-pay

## 2018-11-21 ENCOUNTER — Encounter: Payer: Self-pay | Admitting: Podiatry

## 2018-11-21 ENCOUNTER — Ambulatory Visit (INDEPENDENT_AMBULATORY_CARE_PROVIDER_SITE_OTHER): Payer: Medicare Other | Admitting: Podiatry

## 2018-11-21 VITALS — Temp 97.8°F

## 2018-11-21 DIAGNOSIS — I255 Ischemic cardiomyopathy: Secondary | ICD-10-CM

## 2018-11-21 DIAGNOSIS — L603 Nail dystrophy: Secondary | ICD-10-CM

## 2018-11-21 MED ORDER — TERBINAFINE HCL 250 MG PO TABS: 250.0000 mg | ORAL_TABLET | Freq: Every day | ORAL | 0 refills | Status: DC

## 2018-11-21 NOTE — Patient Instructions (Signed)
Dr. Hyatt has sent over a refill for Lamisil to your pharmacy today. The instructions on your bottle will say "take 1 tablet daily", however, he would like for you to take one pill every other day. He will follow up with you in 3 months to re-evaluate your toenails. 

## 2018-11-21 NOTE — Progress Notes (Signed)
He presents today for follow-up of his onychomycosis and long-term therapy with Lamisil.  Is completed 120 days +1 every other day dose and he states they are clearing up they are looking good.  Objective: Vital signs are stable he is alert and oriented x3 nail plates appear to be grown out by about 50 to 60%.  Assessment: Well-healing onychomycosis secondary to long-term therapy with Lamisil.  Plan: We will continue to treat him 1 tablet every other day for the next 60 days I will follow-up with him in 90 days.

## 2018-11-22 ENCOUNTER — Other Ambulatory Visit: Payer: Self-pay | Admitting: Gastroenterology

## 2018-11-23 ENCOUNTER — Other Ambulatory Visit: Payer: Self-pay | Admitting: Cardiovascular Disease

## 2018-11-23 NOTE — Telephone Encounter (Signed)
Pt last saw Dr Rockey Situ 04/14/19, last labs 12/10/17 Creat 1.02, age 68, weight 129kg, CrCl 128.23, based on CrCl pt is on appropriate dosage of Xarelto 15m QD.  Will refill rx.

## 2018-11-23 NOTE — Telephone Encounter (Signed)
Refill request

## 2018-11-27 ENCOUNTER — Other Ambulatory Visit: Payer: Self-pay

## 2018-11-28 ENCOUNTER — Other Ambulatory Visit: Payer: Self-pay

## 2018-11-28 ENCOUNTER — Ambulatory Visit (INDEPENDENT_AMBULATORY_CARE_PROVIDER_SITE_OTHER): Payer: Medicare Other | Admitting: Gastroenterology

## 2018-11-28 ENCOUNTER — Encounter: Payer: Self-pay | Admitting: Gastroenterology

## 2018-11-28 VITALS — Ht 71.0 in | Wt 276.0 lb

## 2018-11-28 DIAGNOSIS — K5080 Crohn's disease of both small and large intestine without complications: Secondary | ICD-10-CM | POA: Diagnosis not present

## 2018-11-28 DIAGNOSIS — I255 Ischemic cardiomyopathy: Secondary | ICD-10-CM | POA: Diagnosis not present

## 2018-11-28 DIAGNOSIS — Z7901 Long term (current) use of anticoagulants: Secondary | ICD-10-CM | POA: Diagnosis not present

## 2018-11-28 DIAGNOSIS — Z86010 Personal history of colonic polyps: Secondary | ICD-10-CM | POA: Diagnosis not present

## 2018-11-28 DIAGNOSIS — K219 Gastro-esophageal reflux disease without esophagitis: Secondary | ICD-10-CM

## 2018-11-28 MED ORDER — BALSALAZIDE DISODIUM 750 MG PO CAPS: ORAL_CAPSULE | ORAL | 3 refills | Status: AC

## 2018-11-28 NOTE — Patient Instructions (Addendum)
We have sent the following medications to your pharmacy for you to pick up at your convenience: balsalazide.   You will be due for a recall colonoscopy in 01/2019. We will send you a reminder in the mail when it gets closer to that time.  Thank you for choosing me and [facility].  [name]. Dagoberto Ligas., MD., Marval Regal

## 2018-11-28 NOTE — Progress Notes (Addendum)
    History of Present Illness: This is a 68 year old male with Crohn's ileocolitis and a personal history of adenomatous colon polyps.  His last office visit was then October 2018.  Last colonoscopy was performed in May 2010.  Surveillance colonoscopy was recommended in October 2015.  Colonoscopy has been recommended on several occasions and and he has deferred each time.  He reports no ongoing gastrointestinal complaints.  He states he was due for a follow-up with his cardiologist Dr. Rockey Situ in March which has not been completed.  He states he needs a new primary care doctor and has an appointment at the [facility] in [place] in June with a new PCP. Denies weight loss, abdominal pain, constipation, diarrhea, change in stool caliber, melena, hematochezia, nausea, vomiting, dysphagia, reflux symptoms, chest pain.   Current Medications, Allergies, Past Medical History, Past Surgical History, Family History and Social History were reviewed in [employer] record.   Physical Exam: Telemedicine - not performed   Assessment and Recommendations:  1.  Crohn's ileocolitis.  Recommended CBC, CMP, ESR, CRP, TSH however the patient lives some distance away and with the COVID-19 pandemic he is uncomfortable coming for blood work.  Given the situation will defer to his PCP in June.  Refill balsalazide 2.25 g po 3 times daily for 1 year. REV in 1 year.   2.  Personal history of adenomatous colon polyps.  He is 5 years overdue for surveillance and we discussed this in detail.  He has missed several opportunities to schedule colonoscopy over the past several years.  I advised him he needs to have a colonoscopy by July 2020.  He is strongly advised to contact us to schedule a colonoscopy, at the hospital given EF, after he completes visits with his cardiologist and PCP.  3.  GERD.  Follow standard antireflux measures and continue omeprazole 20 mg daily.  4.  PAF on Xarelto.  Patient  advised contact Dr. Rockey Situ for his 6 month follow-up.  Xarelto will need to be held prior to colonoscopy.  5.  Cardiomyopathy, EF= 30-35%.  Status post ICD implant.  Patient advised to contact Dr. Rockey Situ for recommended 6 month follow-up.   These services were provided via telemedicine, audio only per patient request.  The patient was at home and the provider was in the office, alone.  We discussed the limitations of evaluation and management by telemedicine and the availability of in person appointments.  Patient consented for this telemedicine visit and is aware of possible charges for this service.  The other person participating in the telemedicine service was Marlon Pel, CMA who reviewed medications, allergies, past history and completed AVS.  Time spent on call: 11 minutes

## 2018-12-11 ENCOUNTER — Other Ambulatory Visit: Payer: Self-pay | Admitting: Cardiovascular Disease

## 2018-12-23 ENCOUNTER — Other Ambulatory Visit: Payer: Self-pay | Admitting: Cardiovascular Disease

## 2019-01-10 ENCOUNTER — Telehealth: Payer: Self-pay

## 2019-01-10 ENCOUNTER — Encounter: Payer: Self-pay | Admitting: Physician Assistant

## 2019-01-10 MED ORDER — AMIODARONE HCL 200 MG PO TABS: ORAL_TABLET | ORAL | 3 refills | Status: DC

## 2019-01-10 NOTE — Telephone Encounter (Signed)
Call to patient to discuss refill request.   Per mychart Amiodarone was d/c by GI on 4/28th although I do not see any related note. Pt reports that he has continued taking it but will take last dose on Monday. Per GI note he is due to follow up with Dr. Rockey Situ.   Suggested that a virtual visit would be appropriate in this case to confirm Amiodarone.   Spoke to [employer], [place] in person. He agrees with plan to see pt virtually on Monday. In the meantime we will refill amiodarone according to last RX by Dr. Rockey Situ.   Pt agreeable to POC.   Call for e visit appt this afternoon.   CONSENT FOR TELE-HEALTH VISIT - PLEASE REVIEW  I hereby voluntarily request, consent and authorize [facility] and its employed or contracted physicians, physician assistants, nurse practitioners or other licensed health care professionals (the Practitioner), to provide me with telemedicine health care services (the "Services") as deemed necessary by the treating Practitioner. I acknowledge and consent to receive the Services by the Practitioner via telemedicine. I understand that the telemedicine visit will involve communicating with the Practitioner through live audiovisual communication technology and the disclosure of certain medical information by electronic transmission. I acknowledge that I have been given the opportunity to request an in-person assessment or other available alternative prior to the telemedicine visit and am voluntarily participating in the telemedicine visit.  Pt verbally agreed.   I understand that I have the right to withhold or withdraw my consent to the use of telemedicine in the course of my care at any time, without affecting my right to future care or treatment, and that the Practitioner or I may terminate the telemedicine visit at any time. I understand that I have the right to inspect all information obtained and/or recorded in the course of the telemedicine visit and may receive copies of  available information for a reasonable fee.  I understand that some of the potential risks of receiving the Services via telemedicine include:  Marland Kitchen Delay or interruption in medical evaluation due to technological equipment failure or disruption; . Information transmitted may not be sufficient (e.g. poor resolution of images) to allow for appropriate medical decision making by the Practitioner; and/or  . In rare instances, security protocols could fail, causing a breach of personal health information.  Furthermore, I acknowledge that it is my responsibility to provide information about my medical history, conditions and care that is complete and accurate to the best of my ability. I acknowledge that Practitioner's advice, recommendations, and/or decision may be based on factors not within their control, such as incomplete or inaccurate data provided by me or distortions of diagnostic images or specimens that may result from electronic transmissions. I understand that the practice of medicine is not an exact science and that Practitioner makes no warranties or guarantees regarding treatment outcomes. I acknowledge that I will receive a copy of this consent concurrently upon execution via email to the email address I last provided but may also request a printed copy by calling the office of [place].    I understand that my insurance will be billed for this visit.   I have read or had this consent read to me. . I understand the contents of this consent, which adequately explains the benefits and risks of the Services being provided via telemedicine.  . I have been provided ample opportunity to ask questions regarding this consent and the Services and have had my questions  answered to my satisfaction. . I give my informed consent for the services to be provided through the use of telemedicine in my medical care  By participating in this telemedicine visit I agree to the above.    Pt agreed to  consent for virtual visit Monday.

## 2019-01-10 NOTE — Telephone Encounter (Signed)
*  STAT* If patient is at the pharmacy, call can be transferred to refill team.   1. Which medications need to be refilled? (please list name of each medication and dose if known) Amiodarone  2. Which pharmacy/location (including street and city if local pharmacy) is medication to be sent to? [facility] [place]  3. Do they need a 30 day or 90 day supply? [place]

## 2019-01-10 NOTE — Telephone Encounter (Signed)
Medication is not on patients medication list.  Patient requesting a refill.  Please advise.

## 2019-01-10 NOTE — Progress Notes (Signed)
Virtual Visit via Telephone Note   This visit type was conducted due to national recommendations for restrictions regarding the COVID-19 Pandemic (e.g. social distancing) in an effort to limit this patient's exposure and mitigate transmission in our community.  Due to his co-morbid illnesses, this patient is at least at moderate risk for complications without adequate follow up.  This format is felt to be most appropriate for this patient at this time.  The patient did not have access to video technology/had technical difficulties with video requiring transitioning to audio format only (telephone).  All issues noted in this document were discussed and addressed.  No physical exam could be performed with this format.  Please refer to the patient's chart for his  consent to telehealth for [facility].   Date:  01/14/2019   ID:  [name], DOB May 15, 1951, MRN [id number]  Patient Location: Home Provider Location: Office  PCP:  Sherrin Daisy, MD  Cardiologist:  Ida Rogue, MD  Electrophysiologist:  Virl Axe, MD   Evaluation Performed:  Follow-Up Visit  Chief Complaint:  Follow up CAD/CHF  History of Present Illness:    Colton Lamb is a 68 y.o. 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   Prior admission in 10/2013 with a NSTEMI and new onset atrial flutter. Troponin peaked at 17. LHC showed LM widely patent, LAD totally occluded proximally within the previously placed stent, LCx widely patent, co-dominant RCA, distal RCA leading into PDA contained segmental 70% stenosis,  dilated LV with severe dysfunction with an LVEF estimated at 20% or less with possible LV thrombus. Medical management was advised. Echo at that time showed an EF of 20-25%, diffuse HK, Gr3DD, mildly dilated RV with mild to moderately reduced RVSF. Most recent echo from 01/2014 showed an EF of ~25% with mildly to moderately reduced RVSF. He has declined further echocardiograms. He was most recently seen by Dr. Rockey Situ in 04/2018 with his weight being up 14 pounds and noted worsening lower extremity edema. He was eating out a lot and drinking large quantities of water. Documented weight of 284 pounds at that time.  He was briefly diuresed then to take torsemide 20 mg bid alternating with 40 mg bid.   Labs: 12/2018 - HGB 12.1, K+ 4.3, SCr 1.1, AST/ALT normal, albumin 3.9, A1c 6.8, LDL 52, TSH 5.588  Patient is seen in telemedicine follow-up today and is doing well from a cardiac perspective.  No chest pain, shortness of breath, palpitations, dizziness, presyncope, or syncope.  He indicates since increasing his torsemide to 20 mg twice daily alternating with 40 mg twice daily his lower extremity swelling has significantly improved.  He has stable two-pillow orthopnea and states this is in the setting of comfort.  He denies any abdominal distention, PND, or early satiety.  He denies adding salt to food and now eats out at restaurants only 1-2 times per month.  Of note, patient's weight has remained stable at around 285 pounds which he states is his approximate baseline weight.  He denies any device shocks from his ICD.  Compliant  with all medications.  No falls since he was last seen.  No BRBPR or melena.  He does not have any issues or concerns today.  The patient does not have symptoms concerning for COVID-19 infection (fever, chills, cough, or new shortness of breath).    Past Medical History:  Diagnosis Date   Atrial flutter ([place])    a. s/p Cardioversion 11/22/13, on amiodarone and Xarelto.   Chronic  systolic heart failure ([place])    a. 10/2013 EF 20-25%, grade III DD, RV mildly dilated and sys fx mild/mod reduced;  b. 01/2014 Echo: EF 30-35%, gr3 DD, mod dil LA.   Coronary artery disease    a. s/p MI 2007/2015;  b. s/p prior PCI to the LAD/LCX/PDA/PL;  c. 2008: s/p Cypher DES to the OM.   Crohn's ileocolitis ([place])    GERD (gastroesophageal reflux disease)    Hx of adenomatous colonic polyps 11/2003   Hyperlipidemia    Hypertension    Ischemic cardiomyopathy    s. 01/2014 s/p MDT [id number] [name] XT DR single lead AICD.   Obesity    Paroxysmal atrial fibrillation (HCC)    a. CHA2DS2VASc = 4-->xarelto/amio.   Sleep apnea    Syncope    a.  11/2013 in setting of volume depletion and bradycardia due to dig toxicity    Type II diabetes mellitus ([place])    Past Surgical History:  Procedure Laterality Date   ATRIAL FLUTTER ABLATION N/A 04/16/2014   Procedure: ATRIAL FLUTTER ABLATION;  Surgeon: Evans Lance, MD;  Location: [facility] CATH LAB;  Service: Cardiovascular;  Laterality: N/A;   CARDIAC CATHETERIZATION  10/2013   CARDIAC DEFIBRILLATOR PLACEMENT  04/16/2014   Medtronic Evira device   CARDIAC ELECTROPHYSIOLOGY STUDY AND ABLATION  04/16/2014   atrial flutter ablation   CARDIOVERSION N/A 03/05/2014   Procedure: CARDIOVERSION;  Surgeon: Jolaine Artist, MD;  Location: [place];  Service: Cardiovascular;  Laterality: N/A;   CATARACT EXTRACTION W/PHACO Right 01/04/2017   Procedure: CATARACT EXTRACTION PHACO AND INTRAOCULAR LENS PLACEMENT ([place])  Right Diabetic Complicated;  Surgeon: Leandrew Koyanagi, MD;  Location: [place];  Service: Ophthalmology;  Laterality: Right;  Diabetic   CATARACT EXTRACTION W/PHACO Left 02/08/2017   Procedure: CATARACT EXTRACTION PHACO AND INTRAOCULAR LENS PLACEMENT (I[facility]) left diabetic;  Surgeon: Leandrew Koyanagi, MD;  Location: [place];  Service: Ophthalmology;  Laterality: Left;  Diabetic - oral meds sleep apnea   CORONARY  ANGIOPLASTY WITH STENT PLACEMENT  2007; 2008 X 2   "1+1 ~ 1"   FOOT SURGERY Left    bone spur   HYDROCELE EXCISION Bilateral    Ileocecal resection and sigmoid enterocolonic fistula repair  09/1998   IMPLANTABLE CARDIOVERTER DEFIBRILLATOR IMPLANT N/A 04/16/2014   Procedure: IMPLANTABLE CARDIOVERTER DEFIBRILLATOR IMPLANT;  Surgeon: [place] Lance, MD;  Location: [facility] CATH LAB;  Service: Cardiovascular;  Laterality: N/A;   LEFT HEART CATHETERIZATION WITH CORONARY ANGIOGRAM N/A 11/22/2013   Procedure: LEFT HEART CATHETERIZATION WITH CORONARY ANGIOGRAM;  Surgeon: Sinclair Grooms, MD;  Location: [facility] CATH LAB;  Service: Cardiovascular;  Laterality: N/A;     Current Meds  Medication Sig   acetaminophen (TYLENOL) 325 MG tablet Take 1-2 tablets (325-650 mg total) by mouth every 4 (four) hours as needed for mild pain.   amiodarone (PACERONE) 200 MG tablet Take 1 tablet (200 mg total) once daily M-F and 0.5 tablet (100 mg total) Saturday and Sunday.   aspirin 81 MG tablet Take 81 mg by mouth daily.  atorvastatin (LIPITOR) 80 MG tablet TAKE 1 TABLET BY MOUTH EVERY DAY   balsalazide (COLAZAL) 750 MG capsule TAKE 1 CAPSULE (750 MG TOTAL) BY MOUTH 3 (THREE) TIMES DAILY.   carvedilol (COREG) 12.5 MG tablet TAKE 1 TABLET (12.5 MG TOTAL) BY MOUTH 2 (TWO) TIMES DAILY.   ENTRESTO 49-51 MG TAKE 1 TABLET BY MOUTH TWICE A DAY   JARDIANCE 10 MG TABS tablet Take 10 mg by mouth daily.   metFORMIN (GLUCOPHAGE) 1000 MG tablet Take 1,000 mg by mouth 2 (two) times daily with a meal.    niacin (NIASPAN) 1000 MG CR tablet TAKE 1 TABLET BY MOUTH AT BEDTIME   nitroGLYCERIN (NITROSTAT) 0.4 MG SL tablet Place 0.4 mg under the tongue every 5 (five) minutes as needed (MAX 3 TABLETS). FOR CHEST PAIN   omeprazole (PRILOSEC) 20 MG capsule Take 20 mg by mouth daily.    potassium chloride (K-DUR) 10 MEQ tablet TAKE 1 TABLET BY MOUTH EVERY DAY   terbinafine (LAMISIL) 250 MG tablet Take 1 tablet (250 mg total) by  mouth daily.   torsemide (DEMADEX) 20 MG tablet Take 2 tablets (40 mg total) by mouth as directed. Alternate 20 mg twice a day and 40 mg twice a day   XARELTO 20 MG TABS tablet TAKE 1 TABLET BY MOUTH EVERY DAY WITH LUNCH     Allergies:   Iodine, Shrimp [shellfish allergy], and Tetracycline   Social History   Tobacco Use   Smoking status: Never Smoker   Smokeless tobacco: Never Used  Substance Use Topics   Alcohol use: No   Drug use: No     Family Hx: The patient's family history includes Breast cancer in his mother; Heart attack in his father; Heart disease in his father. There is no history of Colon cancer.  ROS:   Please see the history of present illness.     All other systems reviewed and are negative.   Prior CV studies:   The following studies were reviewed today:  2D Echo 01/2014: - Left ventricle: The cavity size was severely dilated. Systolic  function was moderately to severely reduced. The estimated  ejection fraction was in the range of 30% to 35%. Doppler  parameters are consistent with a reversible restrictive pattern,  indicative of decreased left ventricular diastolic compliance  and/or increased left atrial pressure (grade 3 diastolic  dysfunction).  - Mitral valve: Calcified annulus.  - Left atrium: The atrium was moderately dilated.   Impressions:   - EF appears mildly improved when compared to prior. Compared to  the prior study, there has been no significant interval change.   Labs/Other Tests and Data Reviewed:    EKG:  No ECG reviewed.  Recent Labs: 04/18/2018: ALT 15   Recent Lipid Panel Lab Results  Component Value Date/Time   CHOL 112 05/09/2016 08:30 AM   TRIG 100 05/09/2016 08:30 AM   HDL 35 (L) 05/09/2016 08:30 AM   CHOLHDL 3.2 11/23/2013 02:00 AM   LDLCALC 57 05/09/2016 08:30 AM    Wt Readings from Last 3 Encounters:  01/14/19 285 lb (129.3 kg)  11/28/18 276 lb (125.2 kg)  04/13/18 284 lb 8 oz (129 kg)      Objective:    Vital Signs:  BP 130/82    Ht 5' 11"  (1.803 m)    Wt 285 lb (129.3 kg)    BMI 39.75 kg/m    VITAL SIGNS:  reviewed  ASSESSMENT & PLAN:    1. CAD involving the native  coronary arteries without angina: He is doing well without any symptoms concerning for angina.  Continue current medications including aspirin, Lipitor, Coreg.  No plans for further ischemic evaluation at this time.  Continue aggressive secondary prevention.  2. Chronic combined CHF/ICM: Patient denies any symptoms of volume overload or decompensation.  Weight is stable when compared to last office visit in which there were concerns the patient may have been volume overloaded.  Patient states with current dosing of torsemide 20 mg twice daily alternating with 40 mg twice daily his lower extremity swelling and shortness of breath are much improved.  In this setting, we will not escalate diuretic therapy any further at this time given lack of symptoms.  Recent renal function at baseline as outlined above.  He is status post Medtronic ICD and is followed by EP.  Continue Coreg, Entresto, and torsemide.  Schedule echocardiogram to evaluate for potential improvement in LV systolic function as his last echo was in 01/2014 prior to ICD implantation.  CHF education.  3. PAF/atrial flutter: He denies any symptoms consistent with palpitations.  He is status post atrial flutter ablation as outlined above.  Continue Coreg, amiodarone, and Xarelto.  Recent CBC demonstrating stable hemoglobin.  Recent liver function normal.  Recent TSH mildly elevated and will need to be followed up on at his next in person visit.  4. Hypertension: Blood pressure is reasonably controlled.  Continue current medications as outlined above.  5. Hyperlipidemia: LDL at goal as above.  Continue Lipitor.  COVID-19 Education: The signs and symptoms of COVID-19 were discussed with the patient and how to seek care for testing (follow up with PCP or arrange  E-visit).  The importance of social distancing was discussed today.  Time:   Today, I have spent 12 minutes with the patient with telehealth technology discussing the above problems.     Medication Adjustments/Labs and Tests Ordered: Current medicines are reviewed at length with the patient today.  Concerns regarding medicines are outlined above.   Tests Ordered: No orders of the defined types were placed in this encounter.   Medication Changes: No orders of the defined types were placed in this encounter.   Follow Up:  Virtual Visit in 6 month(s)  Signed, Christell Faith, PA-C  01/14/2019 10:32 AM    [place]

## 2019-01-14 ENCOUNTER — Other Ambulatory Visit: Payer: Self-pay

## 2019-01-14 ENCOUNTER — Telehealth (INDEPENDENT_AMBULATORY_CARE_PROVIDER_SITE_OTHER): Payer: Medicare Other | Admitting: Physician Assistant

## 2019-01-14 ENCOUNTER — Encounter: Payer: Self-pay | Admitting: Physician Assistant

## 2019-01-14 VITALS — BP 130/82 | Ht 71.0 in | Wt 285.0 lb

## 2019-01-14 DIAGNOSIS — I2510 Atherosclerotic heart disease of native coronary artery without angina pectoris: Secondary | ICD-10-CM

## 2019-01-14 DIAGNOSIS — Z95810 Presence of automatic (implantable) cardiac defibrillator: Secondary | ICD-10-CM

## 2019-01-14 DIAGNOSIS — I480 Paroxysmal atrial fibrillation: Secondary | ICD-10-CM

## 2019-01-14 DIAGNOSIS — I10 Essential (primary) hypertension: Secondary | ICD-10-CM

## 2019-01-14 DIAGNOSIS — I5042 Chronic combined systolic (congestive) and diastolic (congestive) heart failure: Secondary | ICD-10-CM

## 2019-01-14 DIAGNOSIS — E785 Hyperlipidemia, unspecified: Secondary | ICD-10-CM

## 2019-01-14 DIAGNOSIS — I4892 Unspecified atrial flutter: Secondary | ICD-10-CM

## 2019-01-14 NOTE — Patient Instructions (Signed)
It was a pleasure to speak with you on the phone today! Thank you for allowing [place] to continue taking care of your [facility] needs during this time.   Feel free to call as needed for questions and concerns related to your cardiac needs.   Medication Instructions:  Your physician recommends that you continue on your current medications as directed. Please refer to the Current Medication list given to you today.  If you need a refill on your cardiac medications before your next appointment, please call your pharmacy.   Lab work: None ordered  If you have labs (blood work) drawn today and your tests are completely normal, you will receive your results only by: Marland Kitchen MyChart Message (if you have MyChart) OR . A paper copy in the mail If you have any lab test that is abnormal or we need to change your treatment, we will call you to review the results.  Testing/Procedures: 1- Echo  Please return to [facility] on ______________ at _______________ AM/PM for an Echocardiogram. Your physician has requested that you have an echocardiogram. Echocardiography is a painless test that uses sound waves to create images of your heart. It provides your doctor with information about the size and shape of your heart and how well your heart's chambers and valves are working. This procedure takes approximately one hour. There are no restrictions for this procedure. Please note; depending on visual quality an IV may need to be placed.    Follow-Up: At [facility], you and your health needs are our priority.  As part of our continuing mission to provide you with exceptional heart care, we have created designated Provider Care Teams.  These Care Teams include your primary Cardiologist (physician) and Advanced Practice Providers (APPs -  Physician Assistants and Nurse Practitioners) who all work together to provide you with the care you need, when you need it. You will need a follow up appointment in 6  months.  Please call our office 2 months in advance to schedule this appointment.  You may see Ida Rogue, MD or Christell Faith, PA-C.

## 2019-01-24 ENCOUNTER — Ambulatory Visit (INDEPENDENT_AMBULATORY_CARE_PROVIDER_SITE_OTHER): Payer: Medicare Other | Admitting: *Deleted

## 2019-01-24 DIAGNOSIS — I255 Ischemic cardiomyopathy: Secondary | ICD-10-CM | POA: Diagnosis not present

## 2019-01-24 LAB — CUP PACEART REMOTE DEVICE CHECK
Battery Remaining Longevity: 75 mo
Battery Voltage: 2.99 V
Brady Statistic AP VP Percent: 0 %
Brady Statistic AP VS Percent: 0.17 %
Brady Statistic AS VP Percent: 0.05 %
Brady Statistic AS VS Percent: 99.78 %
Brady Statistic RA Percent Paced: 0.17 %
Brady Statistic RV Percent Paced: 0.05 %
Date Time Interrogation Session: 20200625083824
HighPow Impedance: 73 Ohm
Implantable Lead Implant Date: 20150916
Implantable Lead Implant Date: 20150916
Implantable Lead Location: 753859
Implantable Lead Location: 753860
Implantable Lead Model: 5076
Implantable Lead Model: 6935
Implantable Pulse Generator Implant Date: 20150916
Lead Channel Impedance Value: 266 Ohm
Lead Channel Impedance Value: 323 Ohm
Lead Channel Impedance Value: 380 Ohm
Lead Channel Pacing Threshold Amplitude: 0.875 V
Lead Channel Pacing Threshold Amplitude: 1.125 V
Lead Channel Pacing Threshold Pulse Width: 0.4 ms
Lead Channel Pacing Threshold Pulse Width: 0.4 ms
Lead Channel Sensing Intrinsic Amplitude: 1.5 mV
Lead Channel Sensing Intrinsic Amplitude: 5.25 mV
Lead Channel Setting Pacing Amplitude: 1.75 V
Lead Channel Setting Pacing Amplitude: 2.25 V
Lead Channel Setting Pacing Pulse Width: 0.4 ms
Lead Channel Setting Sensing Sensitivity: 0.3 mV

## 2019-01-31 ENCOUNTER — Encounter: Payer: Self-pay | Admitting: Cardiology

## 2019-01-31 NOTE — Progress Notes (Signed)
Remote ICD transmission.   

## 2019-02-07 ENCOUNTER — Telehealth: Payer: Self-pay

## 2019-02-07 NOTE — Telephone Encounter (Signed)

## 2019-02-08 ENCOUNTER — Other Ambulatory Visit: Payer: Self-pay

## 2019-02-08 ENCOUNTER — Ambulatory Visit (INDEPENDENT_AMBULATORY_CARE_PROVIDER_SITE_OTHER): Payer: Medicare Other

## 2019-02-08 DIAGNOSIS — I5042 Chronic combined systolic (congestive) and diastolic (congestive) heart failure: Secondary | ICD-10-CM

## 2019-02-08 MED ORDER — PERFLUTREN LIPID MICROSPHERE
1.0000 mL | INTRAVENOUS | Status: AC | PRN
  Administered 2019-02-08: 2 mL via INTRAVENOUS

## 2019-02-09 ENCOUNTER — Other Ambulatory Visit: Payer: Self-pay | Admitting: Cardiovascular Disease

## 2019-02-20 ENCOUNTER — Other Ambulatory Visit: Payer: Self-pay

## 2019-02-20 ENCOUNTER — Ambulatory Visit: Payer: Medicare Other | Admitting: Podiatry

## 2019-02-20 ENCOUNTER — Ambulatory Visit (INDEPENDENT_AMBULATORY_CARE_PROVIDER_SITE_OTHER): Payer: Medicare Other | Admitting: Podiatry

## 2019-02-20 ENCOUNTER — Encounter: Payer: Self-pay | Admitting: Podiatry

## 2019-02-20 VITALS — Temp 98.0°F

## 2019-02-20 DIAGNOSIS — I255 Ischemic cardiomyopathy: Secondary | ICD-10-CM | POA: Diagnosis not present

## 2019-02-20 DIAGNOSIS — L603 Nail dystrophy: Secondary | ICD-10-CM | POA: Diagnosis not present

## 2019-02-20 MED ORDER — TERBINAFINE HCL 250 MG PO TABS: 250.0000 mg | ORAL_TABLET | Freq: Every day | ORAL | 0 refills | Status: DC

## 2019-02-20 NOTE — Progress Notes (Signed)
He presents today for follow-up of his nail fungus is completed his second every other day dosing regimen is very happy with the outcome thus far.  Objective: Toenails are looking much improved no erythema edema/drainage odor.  Assessment: Well-healing onychomycosis long-term therapy with Lamisil.  Plan: He will continue 1 more dose Lamisil 250 mg tablets 1 p.o. every other day.  Follow-up with him in 3 months

## 2019-03-04 ENCOUNTER — Emergency Department: Payer: Medicare Other

## 2019-03-04 ENCOUNTER — Inpatient Hospital Stay
Admission: EM | Admit: 2019-03-04 | Discharge: 2019-03-19 | DRG: 871 | Disposition: A | Discharge status: Skilled Nursing Facility | Payer: Medicare Other | Attending: Internal Medicine | Admitting: Internal Medicine

## 2019-03-04 ENCOUNTER — Other Ambulatory Visit: Payer: Self-pay

## 2019-03-04 DIAGNOSIS — N170 Acute kidney failure with tubular necrosis: Secondary | ICD-10-CM | POA: Diagnosis not present

## 2019-03-04 DIAGNOSIS — Z881 Allergy status to other antibiotic agents status: Secondary | ICD-10-CM

## 2019-03-04 DIAGNOSIS — Z6838 Body mass index (BMI) 38.0-38.9, adult: Secondary | ICD-10-CM | POA: Diagnosis not present

## 2019-03-04 DIAGNOSIS — I5041 Acute combined systolic (congestive) and diastolic (congestive) heart failure: Secondary | ICD-10-CM | POA: Diagnosis present

## 2019-03-04 DIAGNOSIS — E785 Hyperlipidemia, unspecified: Secondary | ICD-10-CM | POA: Diagnosis present

## 2019-03-04 DIAGNOSIS — G4733 Obstructive sleep apnea (adult) (pediatric): Secondary | ICD-10-CM | POA: Diagnosis present

## 2019-03-04 DIAGNOSIS — Z95810 Presence of automatic (implantable) cardiac defibrillator: Secondary | ICD-10-CM | POA: Diagnosis not present

## 2019-03-04 DIAGNOSIS — I252 Old myocardial infarction: Secondary | ICD-10-CM

## 2019-03-04 DIAGNOSIS — Z20828 Contact with and (suspected) exposure to other viral communicable diseases: Secondary | ICD-10-CM | POA: Diagnosis present

## 2019-03-04 DIAGNOSIS — J441 Chronic obstructive pulmonary disease with (acute) exacerbation: Secondary | ICD-10-CM | POA: Diagnosis present

## 2019-03-04 DIAGNOSIS — Z86010 Personal history of colonic polyps: Secondary | ICD-10-CM

## 2019-03-04 DIAGNOSIS — Z7984 Long term (current) use of oral hypoglycemic drugs: Secondary | ICD-10-CM

## 2019-03-04 DIAGNOSIS — I5032 Chronic diastolic (congestive) heart failure: Secondary | ICD-10-CM

## 2019-03-04 DIAGNOSIS — L89152 Pressure ulcer of sacral region, stage 2: Secondary | ICD-10-CM | POA: Diagnosis not present

## 2019-03-04 DIAGNOSIS — A419 Sepsis, unspecified organism: Secondary | ICD-10-CM | POA: Diagnosis not present

## 2019-03-04 DIAGNOSIS — R6521 Severe sepsis with septic shock: Secondary | ICD-10-CM | POA: Diagnosis present

## 2019-03-04 DIAGNOSIS — I2510 Atherosclerotic heart disease of native coronary artery without angina pectoris: Secondary | ICD-10-CM | POA: Diagnosis present

## 2019-03-04 DIAGNOSIS — I471 Supraventricular tachycardia: Secondary | ICD-10-CM | POA: Diagnosis present

## 2019-03-04 DIAGNOSIS — A408 Other streptococcal sepsis: Principal | ICD-10-CM | POA: Diagnosis present

## 2019-03-04 DIAGNOSIS — N179 Acute kidney failure, unspecified: Secondary | ICD-10-CM

## 2019-03-04 DIAGNOSIS — K219 Gastro-esophageal reflux disease without esophagitis: Secondary | ICD-10-CM | POA: Diagnosis present

## 2019-03-04 DIAGNOSIS — Z4682 Encounter for fitting and adjustment of non-vascular catheter: Secondary | ICD-10-CM | POA: Diagnosis not present

## 2019-03-04 DIAGNOSIS — J9601 Acute respiratory failure with hypoxia: Secondary | ICD-10-CM | POA: Diagnosis present

## 2019-03-04 DIAGNOSIS — T380X5A Adverse effect of glucocorticoids and synthetic analogues, initial encounter: Secondary | ICD-10-CM | POA: Diagnosis not present

## 2019-03-04 DIAGNOSIS — R339 Retention of urine, unspecified: Secondary | ICD-10-CM | POA: Diagnosis not present

## 2019-03-04 DIAGNOSIS — L8990 Pressure ulcer of unspecified site, unspecified stage: Secondary | ICD-10-CM | POA: Insufficient documentation

## 2019-03-04 DIAGNOSIS — Z803 Family history of malignant neoplasm of breast: Secondary | ICD-10-CM

## 2019-03-04 DIAGNOSIS — Z8249 Family history of ischemic heart disease and other diseases of the circulatory system: Secondary | ICD-10-CM

## 2019-03-04 DIAGNOSIS — Z955 Presence of coronary angioplasty implant and graft: Secondary | ICD-10-CM | POA: Diagnosis not present

## 2019-03-04 DIAGNOSIS — R6520 Severe sepsis without septic shock: Secondary | ICD-10-CM

## 2019-03-04 DIAGNOSIS — I130 Hypertensive heart and chronic kidney disease with heart failure and stage 1 through stage 4 chronic kidney disease, or unspecified chronic kidney disease: Secondary | ICD-10-CM | POA: Diagnosis present

## 2019-03-04 DIAGNOSIS — I480 Paroxysmal atrial fibrillation: Secondary | ICD-10-CM | POA: Diagnosis present

## 2019-03-04 DIAGNOSIS — Q8789 Other specified congenital malformation syndromes, not elsewhere classified: Secondary | ICD-10-CM | POA: Diagnosis not present

## 2019-03-04 DIAGNOSIS — Z888 Allergy status to other drugs, medicaments and biological substances status: Secondary | ICD-10-CM

## 2019-03-04 DIAGNOSIS — R34 Anuria and oliguria: Secondary | ICD-10-CM | POA: Diagnosis not present

## 2019-03-04 DIAGNOSIS — Z7982 Long term (current) use of aspirin: Secondary | ICD-10-CM

## 2019-03-04 DIAGNOSIS — E876 Hypokalemia: Secondary | ICD-10-CM | POA: Diagnosis not present

## 2019-03-04 DIAGNOSIS — Z7901 Long term (current) use of anticoagulants: Secondary | ICD-10-CM

## 2019-03-04 DIAGNOSIS — R0602 Shortness of breath: Secondary | ICD-10-CM

## 2019-03-04 DIAGNOSIS — Q639 Congenital malformation of kidney, unspecified: Secondary | ICD-10-CM | POA: Diagnosis not present

## 2019-03-04 DIAGNOSIS — Z09 Encounter for follow-up examination after completed treatment for conditions other than malignant neoplasm: Secondary | ICD-10-CM

## 2019-03-04 DIAGNOSIS — J440 Chronic obstructive pulmonary disease with acute lower respiratory infection: Secondary | ICD-10-CM | POA: Diagnosis present

## 2019-03-04 DIAGNOSIS — J90 Pleural effusion, not elsewhere classified: Secondary | ICD-10-CM

## 2019-03-04 DIAGNOSIS — Z9119 Patient's noncompliance with other medical treatment and regimen: Secondary | ICD-10-CM

## 2019-03-04 DIAGNOSIS — J869 Pyothorax without fistula: Secondary | ICD-10-CM | POA: Diagnosis not present

## 2019-03-04 DIAGNOSIS — I255 Ischemic cardiomyopathy: Secondary | ICD-10-CM | POA: Diagnosis present

## 2019-03-04 DIAGNOSIS — Z452 Encounter for adjustment and management of vascular access device: Secondary | ICD-10-CM

## 2019-03-04 DIAGNOSIS — Z91013 Allergy to seafood: Secondary | ICD-10-CM

## 2019-03-04 DIAGNOSIS — R0603 Acute respiratory distress: Secondary | ICD-10-CM

## 2019-03-04 DIAGNOSIS — Z961 Presence of intraocular lens: Secondary | ICD-10-CM | POA: Diagnosis present

## 2019-03-04 DIAGNOSIS — J189 Pneumonia, unspecified organism: Secondary | ICD-10-CM | POA: Diagnosis present

## 2019-03-04 DIAGNOSIS — J918 Pleural effusion in other conditions classified elsewhere: Secondary | ICD-10-CM | POA: Diagnosis present

## 2019-03-04 DIAGNOSIS — E1122 Type 2 diabetes mellitus with diabetic chronic kidney disease: Secondary | ICD-10-CM | POA: Diagnosis present

## 2019-03-04 DIAGNOSIS — Y92239 Unspecified place in hospital as the place of occurrence of the external cause: Secondary | ICD-10-CM | POA: Diagnosis not present

## 2019-03-04 DIAGNOSIS — I482 Chronic atrial fibrillation, unspecified: Secondary | ICD-10-CM | POA: Diagnosis present

## 2019-03-04 DIAGNOSIS — Z9841 Cataract extraction status, right eye: Secondary | ICD-10-CM

## 2019-03-04 DIAGNOSIS — E872 Acidosis: Secondary | ICD-10-CM | POA: Diagnosis not present

## 2019-03-04 DIAGNOSIS — Z9842 Cataract extraction status, left eye: Secondary | ICD-10-CM

## 2019-03-04 DIAGNOSIS — N182 Chronic kidney disease, stage 2 (mild): Secondary | ICD-10-CM | POA: Diagnosis present

## 2019-03-04 DIAGNOSIS — J9602 Acute respiratory failure with hypercapnia: Secondary | ICD-10-CM | POA: Diagnosis present

## 2019-03-04 DIAGNOSIS — Z79899 Other long term (current) drug therapy: Secondary | ICD-10-CM

## 2019-03-04 DIAGNOSIS — I424 Endocardial fibroelastosis: Secondary | ICD-10-CM | POA: Diagnosis not present

## 2019-03-04 DIAGNOSIS — E1165 Type 2 diabetes mellitus with hyperglycemia: Secondary | ICD-10-CM | POA: Diagnosis not present

## 2019-03-04 DIAGNOSIS — Z5320 Procedure and treatment not carried out because of patient's decision for unspecified reasons: Secondary | ICD-10-CM | POA: Diagnosis present

## 2019-03-04 LAB — URINALYSIS, COMPLETE (UACMP) WITH MICROSCOPIC
Bilirubin Urine: NEGATIVE
Glucose, UA: >=500 mg/dL — AB
Ketones, ur: NEGATIVE mg/dL
Leukocytes,Ua: NEGATIVE
Nitrite: NEGATIVE
Protein, ur: NEGATIVE mg/dL
Specific Gravity, Urine: 1.016 (ref 1.005–1.030)
pH: 5 (ref 5.0–8.0)

## 2019-03-04 LAB — BLOOD GAS, VENOUS
Acid-base deficit: 8.4 mmol/L — ABNORMAL HIGH (ref 0.0–2.0)
Bicarbonate: 17.2 mmol/L — ABNORMAL LOW (ref 20.0–28.0)
O2 Saturation: 82.2 %
Patient temperature: 37
pCO2, Ven: 35 mmHg — ABNORMAL LOW (ref 44.0–60.0)
pH, Ven: 7.3 (ref 7.250–7.430)
pO2, Ven: 52 mmHg — ABNORMAL HIGH (ref 32.0–45.0)

## 2019-03-04 LAB — TROPONIN I (HIGH SENSITIVITY)
Troponin I (High Sensitivity): 56 ng/L — ABNORMAL HIGH (ref <18)
Troponin I (High Sensitivity): 50 ng/L — ABNORMAL HIGH (ref <18)

## 2019-03-04 LAB — CBC WITH DIFFERENTIAL/PLATELET
Abs Immature Granulocytes: 0.96 10*3/uL — ABNORMAL HIGH (ref 0.00–0.07)
Basophils Absolute: 0.1 10*3/uL (ref 0.0–0.1)
Basophils Relative: 0 %
Eosinophils Absolute: 0.1 10*3/uL (ref 0.0–0.5)
Eosinophils Relative: 0 %
HCT: 37 % — ABNORMAL LOW (ref 39.0–52.0)
Hemoglobin: 12.1 g/dL — ABNORMAL LOW (ref 13.0–17.0)
Immature Granulocytes: 3 %
Lymphocytes Relative: 2 %
Lymphs Abs: 0.7 10*3/uL (ref 0.7–4.0)
MCH: 27.8 pg (ref 26.0–34.0)
MCHC: 32.7 g/dL (ref 30.0–36.0)
MCV: 85.1 fL (ref 80.0–100.0)
Monocytes Absolute: 1.5 10*3/uL — ABNORMAL HIGH (ref 0.1–1.0)
Monocytes Relative: 5 %
Neutro Abs: 27.8 10*3/uL — ABNORMAL HIGH (ref 1.7–7.7)
Neutrophils Relative %: 90 %
Platelets: 331 10*3/uL (ref 150–400)
RBC: 4.35 MIL/uL (ref 4.22–5.81)
RDW: 15.7 % — ABNORMAL HIGH (ref 11.5–15.5)
WBC: 31.2 10*3/uL — ABNORMAL HIGH (ref 4.0–10.5)
nRBC: 0 % (ref 0.0–0.2)

## 2019-03-04 LAB — GLUCOSE, CAPILLARY: Glucose-Capillary: 212 mg/dL — ABNORMAL HIGH (ref 70–99)

## 2019-03-04 LAB — BASIC METABOLIC PANEL
Anion gap: 18 — ABNORMAL HIGH (ref 5–15)
BUN: 72 mg/dL — ABNORMAL HIGH (ref 8–23)
CO2: 17 mmol/L — ABNORMAL LOW (ref 22–32)
Calcium: 7.6 mg/dL — ABNORMAL LOW (ref 8.9–10.3)
Chloride: 96 mmol/L — ABNORMAL LOW (ref 98–111)
Creatinine, Ser: 3.94 mg/dL — ABNORMAL HIGH (ref 0.61–1.24)
GFR calc Af Amer: 17 mL/min — ABNORMAL LOW (ref >60)
GFR calc non Af Amer: 15 mL/min — ABNORMAL LOW (ref >60)
Glucose, Bld: 199 mg/dL — ABNORMAL HIGH (ref 70–99)
Potassium: 4.4 mmol/L (ref 3.5–5.1)
Sodium: 131 mmol/L — ABNORMAL LOW (ref 135–145)

## 2019-03-04 LAB — CREATININE, SERUM
Creatinine, Ser: 4.38 mg/dL — ABNORMAL HIGH (ref 0.61–1.24)
GFR calc Af Amer: 15 mL/min — ABNORMAL LOW (ref >60)
GFR calc non Af Amer: 13 mL/min — ABNORMAL LOW (ref >60)

## 2019-03-04 LAB — LACTIC ACID, PLASMA
Lactic Acid, Venous: 2.7 mmol/L (ref 0.5–1.9)
Lactic Acid, Venous: 2.3 mmol/L (ref 0.5–1.9)

## 2019-03-04 LAB — MRSA PCR SCREENING: MRSA by PCR: NEGATIVE

## 2019-03-04 LAB — SARS CORONAVIRUS 2 BY RT PCR (HOSPITAL ORDER, PERFORMED IN ~~LOC~~ HOSPITAL LAB): SARS Coronavirus 2: NEGATIVE

## 2019-03-04 LAB — PROCALCITONIN: Procalcitonin: 27.61 ng/mL

## 2019-03-04 LAB — BRAIN NATRIURETIC PEPTIDE: B Natriuretic Peptide: 972 pg/mL — ABNORMAL HIGH (ref 0.0–100.0)

## 2019-03-04 LAB — APTT: aPTT: 49 seconds — ABNORMAL HIGH (ref 24–36)

## 2019-03-04 MED ORDER — HEPARIN SODIUM (PORCINE) 5000 UNIT/ML IJ SOLN
5000.0000 [IU] | Freq: Three times a day (TID) | INTRAMUSCULAR | Status: DC
  Administered 2019-03-04 – 2019-03-18 (×37): 5000 [IU] via SUBCUTANEOUS
  Filled 2019-03-04 (×39): qty 1

## 2019-03-04 MED ORDER — CHLORHEXIDINE GLUCONATE CLOTH 2 % EX PADS
6.0000 | MEDICATED_PAD | Freq: Every day | CUTANEOUS | Status: DC
  Administered 2019-03-05 – 2019-03-18 (×8): 6 via TOPICAL
  Filled 2019-03-04: qty 6

## 2019-03-04 MED ORDER — METRONIDAZOLE IN NACL 5-0.79 MG/ML-% IV SOLN
500.0000 mg | Freq: Once | INTRAVENOUS | Status: AC
  Administered 2019-03-04: 500 mg via INTRAVENOUS
  Filled 2019-03-04: qty 100

## 2019-03-04 MED ORDER — VANCOMYCIN HCL IN DEXTROSE 1-5 GM/200ML-% IV SOLN
1000.0000 mg | Freq: Once | INTRAVENOUS | Status: AC
  Administered 2019-03-04: 1000 mg via INTRAVENOUS
  Filled 2019-03-04: qty 200

## 2019-03-04 MED ORDER — FUROSEMIDE 10 MG/ML IJ SOLN: 40.0000 mg | Freq: Two times a day (BID) | INTRAMUSCULAR | Status: DC

## 2019-03-04 MED ORDER — NITROGLYCERIN 0.4 MG SL SUBL: 0.4000 mg | SUBLINGUAL_TABLET | SUBLINGUAL | Status: DC | PRN

## 2019-03-04 MED ORDER — LACTATED RINGERS IV BOLUS
1000.0000 mL | Freq: Once | INTRAVENOUS | Status: AC
  Administered 2019-03-04: 1000 mL via INTRAVENOUS

## 2019-03-04 MED ORDER — POTASSIUM CHLORIDE CRYS ER 10 MEQ PO TBCR
10.0000 meq | EXTENDED_RELEASE_TABLET | Freq: Every day | ORAL | Status: DC
  Filled 2019-03-04 (×2): qty 1

## 2019-03-04 MED ORDER — ACETAMINOPHEN 650 MG RE SUPP: 650.0000 mg | Freq: Four times a day (QID) | RECTAL | Status: DC | PRN

## 2019-03-04 MED ORDER — POTASSIUM CHLORIDE ER 10 MEQ PO TBCR
10.0000 meq | EXTENDED_RELEASE_TABLET | Freq: Every day | ORAL | Status: DC
  Filled 2019-03-04: qty 1

## 2019-03-04 MED ORDER — NIACIN ER (ANTIHYPERLIPIDEMIC) 1000 MG PO TBCR: 1000.0000 mg | EXTENDED_RELEASE_TABLET | Freq: Every day | ORAL | Status: DC

## 2019-03-04 MED ORDER — ACETAMINOPHEN 325 MG PO TABS
650.0000 mg | ORAL_TABLET | Freq: Four times a day (QID) | ORAL | Status: DC | PRN
  Administered 2019-03-16: 650 mg via ORAL
  Filled 2019-03-04: qty 2

## 2019-03-04 MED ORDER — SODIUM CHLORIDE 0.9 % IV SOLN
2.0000 g | Freq: Once | INTRAVENOUS | Status: AC
  Administered 2019-03-04: 2 g via INTRAVENOUS
  Filled 2019-03-04: qty 2

## 2019-03-04 MED ORDER — FUROSEMIDE 10 MG/ML IJ SOLN
20.0000 mg | Freq: Two times a day (BID) | INTRAMUSCULAR | Status: DC
  Administered 2019-03-04: 20 mg via INTRAVENOUS
  Filled 2019-03-04: qty 4

## 2019-03-04 MED ORDER — VANCOMYCIN VARIABLE DOSE PER UNSTABLE RENAL FUNCTION (PHARMACIST DOSING): Status: DC

## 2019-03-04 MED ORDER — PANTOPRAZOLE SODIUM 40 MG IV SOLR
40.0000 mg | Freq: Every day | INTRAVENOUS | Status: DC
  Administered 2019-03-04 – 2019-03-12 (×9): 40 mg via INTRAVENOUS
  Filled 2019-03-04 (×9): qty 40

## 2019-03-04 MED ORDER — POLYETHYLENE GLYCOL 3350 17 G PO PACK: 17.0000 g | PACK | Freq: Every day | ORAL | Status: DC | PRN

## 2019-03-04 MED ORDER — ONDANSETRON HCL 4 MG/2ML IJ SOLN: 4.0000 mg | Freq: Four times a day (QID) | INTRAMUSCULAR | Status: DC | PRN

## 2019-03-04 MED ORDER — SODIUM CHLORIDE 0.9 % IV SOLN
2.0000 g | INTRAVENOUS | Status: DC
  Administered 2019-03-05: 18:00:00 2 g via INTRAVENOUS
  Filled 2019-03-04 (×2): qty 2

## 2019-03-04 MED ORDER — NOREPINEPHRINE 4 MG/250ML-% IV SOLN
0.0000 ug/min | INTRAVENOUS | Status: DC
  Administered 2019-03-04: 2 ug/min via INTRAVENOUS
  Administered 2019-03-05 – 2019-03-06 (×2): 3 ug/min via INTRAVENOUS
  Administered 2019-03-06: 5 ug/min via INTRAVENOUS
  Administered 2019-03-08 (×2): 3 ug/min via INTRAVENOUS
  Filled 2019-03-04 (×6): qty 250

## 2019-03-04 MED ORDER — ALBUTEROL SULFATE (2.5 MG/3ML) 0.083% IN NEBU
2.5000 mg | INHALATION_SOLUTION | RESPIRATORY_TRACT | Status: DC | PRN
  Administered 2019-03-06: 2.5 mg via RESPIRATORY_TRACT
  Filled 2019-03-04: qty 3

## 2019-03-04 MED ORDER — ATORVASTATIN CALCIUM 20 MG PO TABS
80.0000 mg | ORAL_TABLET | Freq: Every day | ORAL | Status: DC
  Administered 2019-03-06 – 2019-03-19 (×14): 80 mg via ORAL
  Filled 2019-03-04 (×14): qty 4

## 2019-03-04 MED ORDER — CARVEDILOL 12.5 MG PO TABS: 12.5000 mg | ORAL_TABLET | Freq: Two times a day (BID) | ORAL | Status: DC

## 2019-03-04 MED ORDER — NIACIN ER 500 MG PO TBCR
1000.0000 mg | EXTENDED_RELEASE_TABLET | Freq: Every day | ORAL | Status: DC
  Administered 2019-03-05 – 2019-03-18 (×14): 1000 mg via ORAL
  Filled 2019-03-04 (×16): qty 2

## 2019-03-04 MED ORDER — PANTOPRAZOLE SODIUM 40 MG PO TBEC: 40.0000 mg | DELAYED_RELEASE_TABLET | Freq: Every day | ORAL | Status: DC

## 2019-03-04 MED ORDER — ONDANSETRON HCL 4 MG PO TABS: 4.0000 mg | ORAL_TABLET | Freq: Four times a day (QID) | ORAL | Status: DC | PRN

## 2019-03-04 MED ORDER — ASPIRIN EC 81 MG PO TBEC
81.0000 mg | DELAYED_RELEASE_TABLET | Freq: Every day | ORAL | Status: DC
  Administered 2019-03-06 – 2019-03-19 (×14): 81 mg via ORAL
  Filled 2019-03-04 (×15): qty 1

## 2019-03-04 NOTE — ED Notes (Signed)
Dr. Kerman Passey informed of critical lactic

## 2019-03-04 NOTE — Progress Notes (Signed)
Pharmacy Antibiotic Note  Colton Lamb is a 68 y.o. male admitted on 03/04/2019 with sepsis/PNA.  Pharmacy has been consulted for Vancomycin and Cefepime dosing.   Plan: Patient received Vancomycin 1 gram and Cefepime 2 gm IV x 1 each in ED. And Metronidazole x 1. -Will continue with Cefepime 2 gm IV q24h for Crcl 25 ml/min -Will order Vancomycin 1 gram IV x 1 for a total loading dose of 202m (Scr 3.94 and getting lasix). Due to unstable renal fxn will get a random Vancomycin level 24 hrs after 1st dose at 1600 on 8/4 and assess. Follow renal fxn, pt on lasix. F/u MRSA PCR    Height: 6' (182.9 cm) Weight: 280 lb (127 kg) IBW/kg (Calculated) : 77.6  Temp (24hrs), Avg:98.1 F (36.7 C), Min:97.5 F (36.4 C), Max:98.6 F (37 C)  Recent Labs  Lab 03/04/19 1414 03/04/19 1449 03/04/19 1629  WBC 31.2*  --   --   CREATININE  --  3.94*  --   LATICACIDVEN  --   --  2.7*    Estimated Creatinine Clearance: 25.1 mL/min (A) (by C-G formula based on SCr of 3.94 mg/dL (H)).    Allergies  Allergen Reactions  . Iodine Other (See Comments)    Shortness of breath, swelling and hives  . Shrimp [Shellfish Allergy] Other (See Comments)    SWELLING    HIVES    SHORTNESS OF BREATH  . Tetracycline Rash    Antimicrobials this admission: Metronidazole 8/3 >>   Vanc 8/3 >>   Cefepime 8/3 >>  Dose adjustments this admission:    Microbiology results: 8/3 BCx: pend 8/3 UCx: pend    Sputum:    8/3 MRSA PCR: pend  Thank you for allowing pharmacy to be a part of this patient's care.  Kairos Panetta A 03/04/2019 7:05 PM

## 2019-03-04 NOTE — ED Notes (Signed)
ED TO INPATIENT HANDOFF REPORT  ED Nurse Name and Phone #: Janett Billow [phone]   S Name/Age/Gender [name] 68 y.o. male Room/Bed: ED11A/ED11A  Code Status   Code Status: Prior  Home/SNF/Other Home Patient oriented to: self, place, time and situation Is this baseline? Yes   Triage Complete: Triage complete  Chief Complaint difficulty breathing  Triage Note No notes on file   Allergies Allergies  Allergen Reactions  . Iodine Other (See Comments)    Shortness of breath, swelling and hives  . Shrimp [Shellfish Allergy] Other (See Comments)    SWELLING    HIVES    SHORTNESS OF BREATH  . Tetracycline Rash    Level of Care/Admitting Diagnosis ED Disposition    ED Disposition Condition [facility] [place] [[id number]]  Level of Care: ICU [6]  Covid Evaluation: Confirmed COVID Negative  Diagnosis: Sepsis [facility]) [[id number]]  Admitting Physician: Odessa Fleming  Attending Physician: Odessa Fleming  Estimated length of stay: past midnight tomorrow  Certification:: I certify this patient will need inpatient services for at least 2 midnights  PT Class (Do Not Modify): Inpatient [101]  PT Acc Code (Do Not Modify): Private [1]       B Medical/Surgery History Past Medical History:  Diagnosis Date  . Atrial flutter ([place])    a. s/p Cardioversion 11/22/13, on amiodarone and Xarelto.  . Chronic systolic heart failure ([place])    a. 10/2013 EF 20-25%, grade III DD, RV mildly dilated and sys fx mild/mod reduced;  b. 01/2014 Echo: EF 30-35%, gr3 DD, mod dil LA.  Marland Kitchen Coronary artery disease    a. s/p MI 2007/2015;  b. s/p prior PCI to the LAD/LCX/PDA/PL;  c. 2008: s/p Cypher DES to the OM.  Marland Kitchen Crohn's ileocolitis ([place])   . GERD (gastroesophageal reflux disease)   . Hx of adenomatous colonic polyps 11/2003  . Hyperlipidemia   . Hypertension   . Ischemic cardiomyopathy    s. 01/2014 s/p MDT [id number] [name] XT DR single lead AICD.  Marland Kitchen  Obesity   . Paroxysmal atrial fibrillation (H[facility])    a. CHA2DS2VASc = 4-->xarelto/amio.  . Sleep apnea   . Syncope    a.  11/2013 in setting of volume depletion and bradycardia due to dig toxicity   . Type II diabetes mellitus ([place])    Past Surgical History:  Procedure Laterality Date  . ATRIAL FLUTTER ABLATION N/A 04/16/2014   Procedure: ATRIAL FLUTTER ABLATION;  Surgeon: Evans Lance, MD;  Location: [facility] CATH LAB;  Service: Cardiovascular;  Laterality: N/A;  . CARDIAC CATHETERIZATION  10/2013  . CARDIAC DEFIBRILLATOR PLACEMENT  04/16/2014   Medtronic Evira device  . CARDIAC ELECTROPHYSIOLOGY STUDY AND ABLATION  04/16/2014   atrial flutter ablation  . CARDIOVERSION N/A 03/05/2014   Procedure: CARDIOVERSION;  Surgeon: Jolaine Artist, MD;  Location: [facility] ENDOSCOPY;  Service: Cardiovascular;  Laterality: N/A;  . CATARACT EXTRACTION W/PHACO Right 01/04/2017   Procedure: CATARACT EXTRACTION PHACO AND INTRAOCULAR LENS PLACEMENT ([place])  Right Diabetic Complicated;  Surgeon: Leandrew Koyanagi, MD;  Location: [place];  Service: Ophthalmology;  Laterality: Right;  Diabetic  . CATARACT EXTRACTION W/PHACO Left 02/08/2017   Procedure: CATARACT EXTRACTION PHACO AND INTRAOCULAR LENS PLACEMENT ([place]) left diabetic;  Surgeon: Leandrew Koyanagi, MD;  Location: [place];  Service: Ophthalmology;  Laterality: Left;  Diabetic - oral meds sleep apnea  . CORONARY ANGIOPLASTY WITH STENT PLACEMENT  2007; 2008 X 2   "1+1 ~  1"  . FOOT SURGERY Left    bone spur  . HYDROCELE EXCISION Bilateral   . 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 IMPLANTABLE CARDIOVERTER DEFIBRILLATOR IMPLANT N/A 04/16/2014   Procedure: IMPLANTABLE CARDIOVERTER DEFIBRILLATOR IMPLANT;  Surgeon: Evans Lance, MD;  Location: [facility] CATH LAB;  Service: Cardiovascular;  Laterality: N/A;  . LEFT HEART CATHETERIZATION WITH CORONARY ANGIOGRAM N/A 11/22/2013   Procedure: LEFT HEART CATHETERIZATION  WITH CORONARY ANGIOGRAM;  Surgeon: Sinclair Grooms, MD;  Location: [facility] CATH LAB;  Service: Cardiovascular;  Laterality: N/A;     A IV Location/Drains/Wounds Patient Lines/Drains/Airways Status   Active Line/Drains/Airways    Name:   Placement date:   Placement time:   Site:   Days:   Peripheral IV 03/04/19 Left Forearm   03/04/19    1421    Forearm   less than 1   Peripheral IV 03/04/19 Right Hand   03/04/19    1422    Hand   less than 1   Incision (Closed) 01/04/17 Eye Right   01/04/17    0759     789   Incision (Closed) 02/08/17 Eye Left   02/08/17    0756     754          Intake/Output Last 24 hours No intake or output data in the 24 hours ending 03/04/19 1905  Labs/Imaging Results for orders placed or performed during the hospital encounter of 03/04/19 (from the past 48 hour(s))  CBC with Differential     Status: Abnormal   Collection Time: 03/04/19  2:14 PM  Result Value Ref Range   WBC 31.2 (H) 4.0 - 10.5 K/uL   RBC 4.35 4.22 - 5.81 MIL/uL   Hemoglobin 12.1 (L) 13.0 - 17.0 g/dL   HCT 37.0 (L) 39.0 - 52.0 %   MCV 85.1 80.0 - 100.0 fL   MCH 27.8 26.0 - 34.0 pg   MCHC 32.7 30.0 - 36.0 g/dL   RDW 15.7 (H) 11.5 - 15.5 %   Platelets 331 150 - 400 K/uL   nRBC 0.0 0.0 - 0.2 %   Neutrophils Relative % 90 %   Neutro Abs 27.8 (H) 1.7 - 7.7 K/uL   Lymphocytes Relative 2 %   Lymphs Abs 0.7 0.7 - 4.0 K/uL   Monocytes Relative 5 %   Monocytes Absolute 1.5 (H) 0.1 - 1.0 K/uL   Eosinophils Relative 0 %   Eosinophils Absolute 0.1 0.0 - 0.5 K/uL   Basophils Relative 0 %   Basophils Absolute 0.1 0.0 - 0.1 K/uL   Immature Granulocytes 3 %   Abs Immature Granulocytes 0.96 (H) 0.00 - 0.07 K/uL   Ovalocytes PRESENT     Comment: Performed at [facility], [address] [place], [place] [address]  Brain natriuretic peptide     Status: Abnormal   Collection Time: 03/04/19  2:14 PM  Result Value Ref Range   B Natriuretic Peptide 972.0 (H) 0.0 - 100.0 pg/mL    Comment:  Performed at [facility], [address] [place], [place] [address]  Urinalysis, Complete w Microscopic     Status: Abnormal   Collection Time: 03/04/19  2:14 PM  Result Value Ref Range   Color, Urine AMBER (A) YELLOW    Comment: BIOCHEMICALS MAY BE AFFECTED BY COLOR   APPearance CLOUDY (A) CLEAR   Specific Gravity, Urine 1.016 1.005 - 1.030   pH 5.0 5.0 - 8.0   Glucose, UA >=500 (  A) NEGATIVE mg/dL   Hgb urine dipstick MODERATE (A) NEGATIVE   Bilirubin Urine NEGATIVE NEGATIVE   Ketones, ur NEGATIVE NEGATIVE mg/dL   Protein, ur NEGATIVE NEGATIVE mg/dL   Nitrite NEGATIVE NEGATIVE   Leukocytes,Ua NEGATIVE NEGATIVE   RBC / HPF 21-50 0 - 5 RBC/hpf   WBC, UA 0-5 0 - 5 WBC/hpf   Bacteria, UA FEW (A) NONE SEEN   Squamous Epithelial / LPF 0-5 0 - 5    Comment: Performed at [facility], [address] [place], [place] [address]  APTT     Status: Abnormal   Collection Time: 03/04/19  2:14 PM  Result Value Ref Range   aPTT 49 (H) 24 - 36 seconds    Comment:        IF BASELINE aPTT IS ELEVATED, SUGGEST PATIENT RISK ASSESSMENT BE USED TO DETERMINE APPROPRIATE ANTICOAGULANT THERAPY. Performed at [facility], [address] [place], [place] [address]   SARS Coronavirus 2 [facility] order, Performed in [facility] hospital lab) Nasopharyngeal Nasopharyngeal Swab     Status: None   Collection Time: 03/04/19  2:17 PM   Specimen: Nasopharyngeal Swab  Result Value Ref Range   SARS Coronavirus 2 NEGATIVE NEGATIVE    Comment: (NOTE) If result is NEGATIVE SARS-CoV-2 target nucleic acids are NOT DETECTED. The SARS-CoV-2 RNA is generally detectable in upper and lower  respiratory specimens during the acute phase of infection. The lowest  concentration of SARS-CoV-2 viral copies this assay can detect is 250  copies / mL. A negative result does not preclude SARS-CoV-2 infection  and should not be used as the sole basis for treatment or other  patient management  decisions.  A negative result may occur with  improper specimen collection / handling, submission of specimen other  than nasopharyngeal swab, presence of viral mutation(s) within the  areas targeted by this assay, and inadequate number of viral copies  (<250 copies / mL). A negative result must be combined with clinical  observations, patient history, and epidemiological information. If result is POSITIVE SARS-CoV-2 target nucleic acids are DETECTED. The SARS-CoV-2 RNA is generally detectable in upper and lower  respiratory specimens dur ing the acute phase of infection.  Positive  results are indicative of active infection with SARS-CoV-2.  Clinical  correlation with patient history and other diagnostic information is  necessary to determine patient infection status.  Positive results do  not rule out bacterial infection or co-infection with other viruses. If result is PRESUMPTIVE POSTIVE SARS-CoV-2 nucleic acids MAY BE PRESENT.   A presumptive positive result was obtained on the submitted specimen  and confirmed on repeat testing.  While 2019 novel coronavirus  (SARS-CoV-2) nucleic acids may be present in the submitted sample  additional confirmatory testing may be necessary for epidemiological  and / or clinical management purposes  to differentiate between  SARS-CoV-2 and other Sarbecovirus currently known to infect humans.  If clinically indicated additional testing with an alternate test  methodology [phone]) is advised. The SARS-CoV-2 RNA is generally  detectable in upper and lower respiratory sp ecimens during the acute  phase of infection. The expected result is Negative. Fact Sheet for Patients:  StrictlyIdeas.no Fact Sheet for Healthcare Providers: BankingDealers.co.za This test is not yet approved or cleared by the [place] FDA and has been authorized for detection and/or diagnosis of SARS-CoV-2 by FDA under an  Emergency Use Authorization (EUA).  This EUA will remain in effect (meaning this test can be used) for the duration  of the COVID-19 declaration under Section 564(b)(1) of the Act, 21 U.S.C. section 360bbb-3(b)(1), unless the authorization is terminated or revoked sooner. Performed at [facility], [address] [place], [place] [address]   Blood gas, venous     Status: Abnormal   Collection Time: 03/04/19  2:17 PM  Result Value Ref Range   pH, Ven 7.30 7.250 - 7.430   pCO2, Ven 35 (L) 44.0 - 60.0 mmHg   pO2, Ven 52.0 (H) 32.0 - 45.0 mmHg   Bicarbonate 17.2 (L) 20.0 - 28.0 mmol/L   Acid-base deficit 8.4 (H) 0.0 - 2.0 mmol/L   O2 Saturation 82.2 %   Patient temperature 37.0    Collection site VEIN    Sample type VENIPUNCTURE     Comment: Performed at [facility], [place], [place] [address]  Troponin I (High Sensitivity)     Status: Abnormal   Collection Time: 03/04/19  2:49 PM  Result Value Ref Range   Troponin I (High Sensitivity) 56 (H) <18 ng/L    Comment: (NOTE) Elevated high sensitivity troponin I (hsTnI) values and significant  changes across serial measurements may suggest ACS but many other  chronic and acute conditions are known to elevate hsTnI results.  Refer to the "Links" section for chest pain algorithms and additional  guidance. Performed at [facility], [address] [place], [place] [address]   Basic metabolic panel     Status: Abnormal   Collection Time: 03/04/19  2:49 PM  Result Value Ref Range   Sodium 131 (L) 135 - 145 mmol/L   Potassium 4.4 3.5 - 5.1 mmol/L   Chloride 96 (L) 98 - 111 mmol/L   CO2 17 (L) 22 - 32 mmol/L   Glucose, Bld 199 (H) 70 - 99 mg/dL   BUN 72 (H) 8 - 23 mg/dL   Creatinine, Ser 3.94 (H) 0.61 - 1.24 mg/dL   Calcium 7.6 (L) 8.9 - 10.3 mg/dL   GFR calc non Af Amer 15 (L) >60 mL/min   GFR calc Af Amer 17 (L) >60 mL/min   Anion gap 18 (H) 5 - 15    Comment: Performed at [facility], [address] [place], [place] [address]  Lactic acid, plasma     Status: Abnormal   Collection Time: 03/04/19  4:29 PM  Result Value Ref Range   Lactic Acid, Venous 2.7 (HH) 0.5 - 1.9 mmol/L    Comment: CRITICAL RESULT CALLED TO, READ BACK BY AND VERIFIED WITH Lorren Splawn AT 1655 ON 03/04/2019 [place]. Performed at [facility], [address] [place], [place] [address]    Ct Head Wo Contrast  Result Date: 03/04/2019 CLINICAL DATA:  Altered level of consciousness. EXAM: CT HEAD WITHOUT CONTRAST TECHNIQUE: Contiguous axial images were obtained from the base of the skull through the vertex without intravenous contrast. COMPARISON:  09/07/2016 FINDINGS: Brain: As seen previously, the brainstem and cerebellum are normal. There is ventricular prominence without evidence old or acute focal infarction of the cerebral hemispheres. No mass lesion, hemorrhage or extra-axial collection. Ventricular size is unchanged since 2018 and likely secondary to central atrophy. As noted previously, normal pressure hydrocephalus is not excluded. Vascular: No abnormal vascular finding. Skull: Negative Sinuses/Orbits: Clear/normal Other: None IMPRESSION: Chronic ventriculomegaly without evidence of focal brain insult. No visible change since February 2018. This is felt most likely secondary to central atrophy. Normal pressure hydrocephalus is not excluded but not favored. Electronically Signed   By: Elta Guadeloupe  Shogry M.D.   On: 03/04/2019 16:16   Dg Chest Port 1 View  Result Date: 03/04/2019 CLINICAL DATA:  Shortness of breath. Respiratory distress. EXAM: PORTABLE CHEST 1 VIEW COMPARISON:  12/10/2017 FINDINGS: There is chronic cardiomegaly. AICD in place. There is a moderate right pleural effusion. Pulmonary vascularity is within normal limits. Suggestion of a small right left effusion but this is not definitive. No acute bone abnormality. IMPRESSION: 1. Moderate right pleural effusion. Possible small  left effusion. 2. Chronic cardiomegaly. Electronically Signed   By: Lorriane Shire M.D.   On: 03/04/2019 14:42    Pending Labs Unresulted Labs (From admission, onward)    Start     Ordered   03/05/19 1600  Vancomycin, random  Once-Timed,   STAT     03/04/19 1843   03/04/19 1455  Blood Culture (routine x 2)  BLOOD CULTURE X 2,   STAT     03/04/19 1454   03/04/19 1449  Lactic acid, plasma  Now then every 2 hours,   STAT     03/04/19 1449   03/04/19 1449  Urine culture  Once,   STAT     03/04/19 1449   Signed and Held  CBC  (heparin)  Once,   R    Comments: Baseline for heparin therapy IF NOT ALREADY DRAWN.  Notify MD if PLT < 100 K.    Signed and Held   Signed and Held  Creatinine, serum  (heparin)  Once,   R    Comments: Baseline for heparin therapy IF NOT ALREADY DRAWN.    Signed and Held   Signed and Held  Basic metabolic panel  Tomorrow morning,   R     Signed and Held   Signed and Held  CBC  Tomorrow morning,   R     Signed and Held          Vitals/Pain Today's Vitals   03/04/19 1815 03/04/19 1830 03/04/19 1845 03/04/19 1900  BP:  96/63  95/61  Pulse: 79 82 68 77  Resp: (!) 31 (!) 27 (!) 26 (!) 27  Temp:      TempSrc:      SpO2: 96% 95% 96% 96%  Weight:      Height:      PainSc:        Isolation Precautions No active isolations  Medications Medications  furosemide (LASIX) injection 20 mg (has no administration in time range)  vancomycin (VANCOCIN) IVPB 1000 mg/200 mL premix (has no administration in time range)  vancomycin variable dose per unstable renal function (pharmacist dosing) (has no administration in time range)  ceFEPIme (MAXIPIME) 2 g in sodium chloride 0.9 % 100 mL IVPB (has no administration in time range)  lactated ringers bolus 1,000 mL (0 mLs Intravenous Stopped 03/04/19 1701)  ceFEPIme (MAXIPIME) 2 g in sodium chloride 0.9 % 100 mL IVPB (0 g Intravenous Stopped 03/04/19 1655)  metroNIDAZOLE (FLAGYL) IVPB 500 mg (0 mg Intravenous Stopped 03/04/19  1655)  vancomycin (VANCOCIN) IVPB 1000 mg/200 mL premix (0 mg Intravenous Stopped 03/04/19 1828)    Mobility walks with person assist Low fall risk   Focused Assessments 1   R Recommendations: See Admitting Provider Note  Report given to:   Additional Notes:

## 2019-03-04 NOTE — ED Notes (Signed)
Ok per Dr. Charna Archer to start abx with one set of blood cultures

## 2019-03-04 NOTE — ED Notes (Signed)
Attempted to call report, nurse to call me back

## 2019-03-04 NOTE — ED Notes (Signed)
Respiratory contacted regarded pt need to go to CT

## 2019-03-04 NOTE — ED Notes (Signed)
Report given to ICU RN

## 2019-03-04 NOTE — ED Notes (Signed)
Pt received 1 liter LR and 1 liter NS while in the ER

## 2019-03-04 NOTE — ED Provider Notes (Addendum)
[facility] Emergency Department Provider Note   ____________________________________________   First MD Initiated Contact with Patient 03/04/19 1420     (approximate)  I have reviewed the triage vital signs and the nursing notes.   HISTORY  Chief Complaint Shortness of Breath    HPI Colton Lamb is a 68 y.o. male with history of atrial flutter on Xarelto, CHF, CAD, and Crohn's disease who presents to the ED with altered mental status and shortness of breath.  History is limited as patient somnolent and slow to respond upon arrival.  Per EMS, patient has been feeling increasingly short of breath over the past 3 days, noted to be hypoxic and started on CPAP during transport.  He additionally was given DuoNeb along with 125 mg Solu-Medrol.  Patient able to follow commands upon arrival, reporting swelling in his lower extremities.  He otherwise denies any fevers or sick contacts, denies any chest pain.        Past Medical History:  Diagnosis Date  . Atrial flutter ([place])    a. s/p Cardioversion 11/22/13, on amiodarone and Xarelto.  . Chronic systolic heart failure ([place])    a. 10/2013 EF 20-25%, grade III DD, RV mildly dilated and sys fx mild/mod reduced;  b. 01/2014 Echo: EF 30-35%, gr3 DD, mod dil LA.  Marland Kitchen Coronary artery disease    a. s/p MI 2007/2015;  b. s/p prior PCI to the LAD/LCX/PDA/PL;  c. 2008: s/p Cypher DES to the OM.  Marland Kitchen Crohn's ileocolitis ([place])   . GERD (gastroesophageal reflux disease)   . Hx of adenomatous colonic polyps 11/2003  . Hyperlipidemia   . Hypertension   . Ischemic cardiomyopathy    s. 01/2014 s/p MDT [id number] [name] XT DR single lead AICD.  Marland Kitchen Obesity   . Paroxysmal atrial fibrillation (HCC)    a. CHA2DS2VASc = 4-->xarelto/amio.  . Sleep apnea   . Syncope    a.  11/2013 in setting of volume depletion and bradycardia due to dig toxicity   . Type II diabetes mellitus [facility])     Patient Active Problem List   Diagnosis Date Noted   . Sepsis ([place]) 03/04/2019  . Restless legs syndrome (RLS) 06/16/2017  . Hypertension, essential 06/16/2017  . Coronary artery disease of native artery of native heart with stable angina pectoris ([place]) 04/27/2017  . Hydrocephalus ([place]) 09/08/2016  . Dizziness and giddiness   . Elevated troponin 09/06/2016  . Type II diabetes mellitus ([place])   . Ischemic cardiomyopathy   . Hyponatremia 07/01/2015  . Diabetes mellitus type 2, uncontrolled, with complications ([place]) 65/78/4696  . 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 OSA (obstructive sleep apnea) 12/20/2013  . Morbid obesity ([place]) 12/20/2013  . AKI (acute kidney injury) - Creatinine improved at d/c 12/14/2013  . Elevated TSH - will need f/u TFTs with PCP in 3-4 weeks 12/14/2013  . Junctional bradycardia - resolved 12/14/2013  . Syncope - due to bradycardia in setting of Digoxin Toxicity 12/11/2013  . Chronic systolic heart failure ([place]) 12/06/2013  . At risk for sudden cardiac death - on LifeVest 2013/11/28  . Acute combined systolic and diastolic CHF, NYHA class 3 -- 2/2 MI 2013/11/28  . Cardiomyopathy, ischemic November 28, 2013  . Atrial flutter ([place]) 11/22/2013  . NSTEMI (non-ST elevated myocardial infarction) ([place]) 11/22/2013  . Long term current use of anticoagulant 10/19/2010  . Hyperlipidemia 05/06/2010  . Edema 05/06/2010  . CAD S/P percutaneous coronary angioplasty - multiple PCIs 11/11/2008  . Atrial fibrillation (  [place]) 11/11/2008  . GERD 11/11/2008  . [place] INTESTINE 11/11/2008  . COLONIC POLYPS, HX OF 11/11/2008  . [place] ALLERGY 11/11/2008    Past Surgical History:  Procedure Laterality Date  . ATRIAL FLUTTER ABLATION N/A 04/16/2014   Procedure: ATRIAL FLUTTER ABLATION;  Surgeon: Evans Lance, MD;  Location: [facility] CATH LAB;  Service: Cardiovascular;  Laterality: N/A;  . CARDIAC CATHETERIZATION  10/2013  . CARDIAC DEFIBRILLATOR PLACEMENT  04/16/2014   Medtronic Evira device   . CARDIAC ELECTROPHYSIOLOGY STUDY AND ABLATION  04/16/2014   atrial flutter ablation  . CARDIOVERSION N/A 03/05/2014   Procedure: CARDIOVERSION;  Surgeon: Jolaine Artist, MD;  Location: [facility] ENDOSCOPY;  Service: Cardiovascular;  Laterality: N/A;  . CATARACT EXTRACTION W/PHACO Right 01/04/2017   Procedure: CATARACT EXTRACTION PHACO AND INTRAOCULAR LENS PLACEMENT ([place])  Right Diabetic Complicated;  Surgeon: Leandrew Koyanagi, MD;  Location: [place];  Service: Ophthalmology;  Laterality: Right;  Diabetic  . CATARACT EXTRACTION W/PHACO Left 02/08/2017   Procedure: CATARACT EXTRACTION PHACO AND INTRAOCULAR LENS PLACEMENT ([place]) left diabetic;  Surgeon: Leandrew Koyanagi, MD;  Location: [place];  Service: Ophthalmology;  Laterality: Left;  Diabetic - oral meds sleep apnea  . CORONARY ANGIOPLASTY WITH STENT PLACEMENT  2007; 2008 X 2   "1+1 ~ 1"  . FOOT SURGERY Left    bone spur  . HYDROCELE EXCISION Bilateral   . 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 IMPLANTABLE CARDIOVERTER DEFIBRILLATOR IMPLANT N/A 04/16/2014   Procedure: IMPLANTABLE CARDIOVERTER DEFIBRILLATOR IMPLANT;  Surgeon: Evans Lance, MD;  Location: [facility] CATH LAB;  Service: Cardiovascular;  Laterality: N/A;  . LEFT HEART CATHETERIZATION WITH CORONARY ANGIOGRAM N/A 11/22/2013   Procedure: LEFT HEART CATHETERIZATION WITH CORONARY ANGIOGRAM;  Surgeon: Sinclair Grooms, MD;  Location[facility] CATH LAB;  Service: Cardiovascular;  Laterality: N/A;    Prior to Admission medications   Medication Sig Start Date End Date Taking? Authorizing Provider  acetaminophen (TYLENOL) 325 MG tablet Take 1-2 tablets (325-650 mg total) by mouth every 4 (four) hours as needed for mild pain. 04/17/14   [name], NP  amiodarone (PACERONE) 200 MG tablet Take 1 tablet (200 mg total) once daily M-F and 0.5 tablet (100 mg total) Saturday and Sunday. 01/10/19   Rise Mu, PA-C  aspirin 81 MG tablet Take 81  mg by mouth daily.     [provider]  atorvastatin (LIPITOR) 80 MG tablet TAKE 1 TABLET BY MOUTH EVERY DAY 12/11/18   Gollan, Kathlene November, MD  balsalazide (COLAZAL) 750 MG capsule TAKE 1 CAPSULE (750 MG TOTAL) BY MOUTH 3 (THREE) TIMES DAILY. 11/28/18   Ladene Artist, MD  Blood Glucose Monitoring Suppl ([place]) w/Device KIT See admin instructions. 01/08/19   [provider]  carvedilol (COREG) 12.5 MG tablet TAKE 1 TABLET (12.5 MG TOTAL) BY MOUTH 2 (TWO) TIMES DAILY. 02/11/19   Minna Merritts, MD  ENTRESTO 49-51 MG TAKE 1 TABLET BY MOUTH TWICE A DAY 05/16/18   Gollan, Kathlene November, MD  JARDIANCE 10 MG TABS tablet Take 10 mg by mouth daily. 01/04/19   [provider]  metFORMIN (GLUCOPHAGE) 1000 MG tablet Take 1,000 mg by mouth 2 (two) times daily with a meal.  06/05/15   [provider]  niacin (NIASPAN) 1000 MG CR tablet TAKE 1 TABLET BY MOUTH AT BEDTIME 07/20/18   Minna Merritts, MD  nitroGLYCERIN (NITROSTAT) 0.4 MG SL tablet Place 0.4  mg under the tongue every 5 (five) minutes as needed (MAX 3 TABLETS). FOR CHEST PAIN    [provider]  omeprazole (PRILOSEC) 20 MG capsule Take 20 mg by mouth daily.     [provider]  ONETOUCH VERIO test strip USE 1 EACH (1 STRIP TOTAL) 3 (THREE) TIMES DAILY USE AS INSTRUCTED. 01/09/19   [provider]  potassium chloride (K-DUR) 10 MEQ tablet TAKE 1 TABLET BY MOUTH EVERY DAY 04/23/18   Minna Merritts, MD  terbinafine (LAMISIL) 250 MG tablet Take 1 tablet (250 mg total) by mouth daily. 02/20/19   Hyatt, Max T, DPM  torsemide (DEMADEX) 20 MG tablet Take 2 tablets (40 mg total) by mouth as directed. Alternate 20 mg twice a day and 40 mg twice a day 04/13/18   Minna Merritts, MD  XARELTO 20 MG TABS tablet TAKE 1 TABLET BY MOUTH EVERY DAY WITH LUNCH 11/23/18   Minna Merritts, MD    Allergies Iodine, Shrimp [shellfish allergy], and Tetracycline  Family History  Problem Relation  Age of Onset  . Breast cancer Mother   . Heart disease Father   . Heart attack Father   . Colon cancer Neg Hx     Social History Social History   Tobacco Use  . Smoking status: Never Smoker  . Smokeless tobacco: Never Used  Substance Use Topics  . Alcohol use: No  . Drug use: No    Review of Systems  Constitutional: No fever/chills Eyes: No visual changes. ENT: No sore throat. Cardiovascular: Denies chest pain. Respiratory: Positive for shortness of breath. Gastrointestinal: No abdominal pain.  No nausea, no vomiting.  No diarrhea.  No constipation. Genitourinary: Negative for dysuria. Musculoskeletal: Negative for back pain. Skin: Negative for rash. Neurological: Negative for headaches, focal weakness or numbness.  Positive for confusion.  ____________________________________________   PHYSICAL EXAM:  VITAL SIGNS: ED Triage Vitals  Enc Vitals Group     BP 03/04/19 1412 (!) 131/115     Pulse Rate 03/04/19 1412 76     Resp 03/04/19 1412 19     Temp 03/04/19 1412 (!) 97.5 F (36.4 C)     Temp Source 03/04/19 1412 Axillary     SpO2 03/04/19 1411 95 %     Weight 03/04/19 1417 280 lb (127 kg)     Height 03/04/19 1417 6' (1.829 m)     Head Circumference --      Peak Flow --      Pain Score 03/04/19 1415 0     Pain Loc --      Pain Edu? --      Excl. in [place]? --     Constitutional: Somnolent but arousable, intermittently confused. Eyes: Conjunctivae are normal. Head: Atraumatic. Nose: No congestion/rhinnorhea. Mouth/Throat: Mucous membranes are moist. Neck: Normal ROM Cardiovascular: Normal rate, regular rhythm. Grossly normal heart sounds. Respiratory: Normal respiratory effort.  No retractions. Lungs CTAB. Gastrointestinal: Soft and nontender. No distention. Genitourinary: deferred Musculoskeletal: No lower extremity tenderness.  2+ pitting edema to bilateral knees. Neurologic: No gross focal neurologic deficits are appreciated, follows commands and moves  all extremities equally. Skin:  Skin is warm, dry and intact. No rash noted.  ____________________________________________ ED ECG REPORT I, Blake Divine, the attending physician, personally viewed and interpreted this ECG.   Date: 03/19/2019  EKG Time: 14:23  Rate: 70  Rhythm: atrial fibrillation, rate 70  Axis: Normal  Intervals:none  ST&T Change: None     LABS (all labs ordered  are listed, but only abnormal results are displayed)  Labs Reviewed  CBC WITH DIFFERENTIAL/PLATELET - Abnormal; Notable for the following components:      Result Value   WBC 31.2 (*)    Hemoglobin 12.1 (*)    HCT 37.0 (*)    RDW 15.7 (*)    Neutro Abs 27.8 (*)    Monocytes Absolute 1.5 (*)    Abs Immature Granulocytes 0.96 (*)    All other components within normal limits  BRAIN NATRIURETIC PEPTIDE - Abnormal; Notable for the following components:   B Natriuretic Peptide 972.0 (*)    All other components within normal limits  BLOOD GAS, VENOUS - Abnormal; Notable for the following components:   pCO2, Ven 35 (*)    pO2, Ven 52.0 (*)    Bicarbonate 17.2 (*)    Acid-base deficit 8.4 (*)    All other components within normal limits  URINALYSIS, COMPLETE (UACMP) WITH MICROSCOPIC - Abnormal; Notable for the following components:   Color, Urine AMBER (*)    APPearance CLOUDY (*)    Glucose, UA >=500 (*)    Hgb urine dipstick MODERATE (*)    Bacteria, UA FEW (*)    All other components within normal limits  LACTIC ACID, PLASMA - Abnormal; Notable for the following components:   Lactic Acid, Venous 2.7 (*)    All other components within normal limits  APTT - Abnormal; Notable for the following components:   aPTT 49 (*)    All other components within normal limits  SARS CORONAVIRUS 2 (HOSPITAL ORDER, [place] [facility])  URINE CULTURE  CULTURE, BLOOD (ROUTINE X 2)  CULTURE, BLOOD (ROUTINE X 2)  LACTIC ACID, PLASMA  BASIC METABOLIC PANEL  TROPONIN I (HIGH SENSITIVITY)   TROPONIN I (HIGH SENSITIVITY)     PROCEDURES  Procedure(s) performed (including Critical Care):  .Critical Care Performed by: Blake Divine, MD Authorized by: Blake Divine, MD   Critical care provider statement:    Critical care time (minutes):  45   Critical care was necessary to treat or prevent imminent or life-threatening deterioration of the following conditions:  Respiratory failure and sepsis   Critical care was time spent personally by me on the following activities:  Ordering and performing treatments and interventions, ordering and review of laboratory studies, ordering and review of radiographic studies, development of treatment plan with patient or surrogate, re-evaluation of patient's condition, review of old charts, evaluation of patient's response to treatment and examination of patient   I assumed direction of critical care for this patient from another provider in my specialty: no       ____________________________________________   INITIAL IMPRESSION / ASSESSMENT AND PLAN / ED COURSE       68 year old male with significant cardiac history and CHF presenting to the ED with altered mental status and shortness of breath, reportedly increasingly short of breath over the past 3 days with decreasing level of responsiveness.  Differential includes sepsis, pneumonia, UTI, COPD exacerbation, CHF exacerbation, ACS, PE.  Upon arrival, patient appears altered but arouses easily to voice and follows commands, no dictation for intubation.  He was transitioned to BiPAP and tolerated this well, reported improvement in symptoms.  Venous blood gas reassuring without evidence of significant CO2 retention.  Chest x-ray with some pulmonary edema but no clear evidence of infection.  He has a significant leukocytosis of greater than 30 and lactic acidosis, also with decreasing blood pressure.  Concern for sepsis and code sepsis initiated  with broad-spectrum antibiotics.  Will need to  judiciously resuscitate with fluids given his CHF and some clinical signs of fluid overload.  He continues to maintain map greater than 65 following fluid bolus.  Awaiting remainder of lab results, patient turned over to Dr. K[place] Passey pending additional results and admission.  Will obtain head CT given his altered mental status.      ____________________________________________   FINAL CLINICAL IMPRESSION(S) / ED DIAGNOSES  Final diagnoses:  Sepsis with acute hypoxic respiratory failure, due to unspecified organism, unspecified whether septic shock present (HCC)  SOB (shortness of breath)  Chronic diastolic congestive heart failure [facility])     ED Discharge Orders    None       Note:  This document was prepared using Dragon voice recognition software and may include unintentional dictation errors.   Blake Divine, MD 03/04/19 Rodena Piety, MD 03/19/19 1024

## 2019-03-04 NOTE — Progress Notes (Signed)
CODE SEPSIS - PHARMACY COMMUNICATION  **Broad Spectrum Antibiotics should be administered within 1 hour of Sepsis diagnosis**  Time Code Sepsis Called/Page Received: 1457 8/3  Antibiotics Ordered: Cefepime/Vanc/Metronidazole  Time of 1st antibiotic administration: 1522  Additional action taken by pharmacy: non  If necessary, Name of Provider/Nurse Contacted: n/a    Noralee Space ,PharmD Clinical Pharmacist  03/04/2019  3:35 PM

## 2019-03-04 NOTE — Progress Notes (Signed)
Family Meeting Note  Advance Directive: no Today a meeting took place with the patient in the ER patient has history of ischemic cardiomyopathy, hypertension, diabetes and chronic care for bone Xarelto comes in emergency room with increasing shortness of breath found to be in sepsis with possible pneumonia and right pleural effusion. Patient is currently on BiPAP. As discussed code status he wishes to be full code. No family in the ER. Time spent 16 minutes   [name], MD

## 2019-03-04 NOTE — Progress Notes (Signed)
RT assisted with patient transport to and from CT scan ,while patient on the H54, with no complications.

## 2019-03-04 NOTE — Consult Note (Addendum)
Name: Colton Lamb MRN: [id number] DOB: 03-31-1951    ADMISSION DATE:  03/04/2019 CONSULTATION DATE:  03/04/2019  REFERRING MD :  [name], MD  CHIEF COMPLAINT: Shortness of Breath  BRIEF PATIENT DESCRIPTION: 69 yo male admitted with acute hypoxic respiratory failure and septic shock requiring Bipap & pressor administration.  SIGNIFICANT EVENTS / STUDIES:  8/3 > CT head: Chronic ventriculomegaly without evidence of focal brain insult. No visible change since February 2018. This is felt most likely secondary to central atrophy. Normal pressure hydrocephalus is not excluded but not favored. 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 Per ED documentation [facility] reported the pt as hypoxic and somnolent requiring Cpap for transport.  Upon arrival in ED pt was placed on Bipap with subsequent improving mental status. Pt denies fevers/chills, denies chest pain/palpitations, denies sputum production.  Significant diagnostics include: BNP- 972, Troponin- 56, Lactic- 2.7, serum bicarb- 17, AG- 18, BUN- 72, Cr.- 3.94, WBC- 31.2 & CXR revealing moderate right sided pleural effusion.  Pt received 2 L of IVF resuscitation and 20 mg of lasix. Pt admitted to ICU on Bipap, and was found to be hypotensive requiring pressor administration.  PAST MEDICAL HISTORY :   has a past medical history of Atrial flutter ([place]), Chronic systolic heart failure ([place]),  Coronary artery disease, Crohn's ileocolitis ([place]), GERD (gastroesophageal reflux disease), adenomatous colonic polyps (11/2003), Hyperlipidemia, Hypertension, Ischemic cardiomyopathy, Obesity, Paroxysmal atrial fibrillation ([place]), Sleep apnea, Syncope, and Type II diabetes mellitus ([place]).  has a past surgical history that includes Ileocecal resection and sigmoid enterocolonic fistula repair (09/1998); Hydrocele surgery (Bilateral); Foot surgery (Left); Cardioversion (N/A, 03/05/2014); Cardiac defibrillator placement (04/16/2014); Cardiac electrophysiology study and ablation (04/16/2014); Coronary angioplasty with stent (2007; 2008 X 2); Cardiac catheterization (10/2013); left heart catheterization with coronary angiogram (N/A, 11/22/2013); Atrial flutter ablation (N/A, 04/16/2014); implantable cardioverter defibrillator implant (N/A, 04/16/2014); Cataract extraction w/PHACO (Right, 01/04/2017); and Cataract extraction w/PHACO (Left, 02/08/2017). Prior to Admission medications   Medication Sig Start Date End Date Taking? Authorizing Provider  acetaminophen (TYLENOL) 325 MG tablet Take 1-2 tablets (325-650 mg total) by mouth every 4 (four) hours as needed for mild pain. 04/17/14   [name], NP  amiodarone (PACERONE) 200 MG tablet Take 1 tablet (200 mg total) once daily M-F and 0.5 tablet (100 mg total) Saturday and Sunday. 01/10/19   Rise Mu, PA-C  aspirin 81 MG tablet Take 81 mg by mouth daily.     [provider]  atorvastatin (LIPITOR) 80 MG tablet TAKE 1 TABLET BY MOUTH EVERY DAY 12/11/18   Gollan, Kathlene November, MD  balsalazide (COLAZAL) 750 MG capsule TAKE 1 CAPSULE (750 MG TOTAL) BY MOUTH 3 (THREE) TIMES DAILY. 11/28/18   Ladene Artist, MD  Blood Glucose Monitoring Suppl ([place]) w/Device KIT See admin instructions. 01/08/19   [provider]  carvedilol (COREG) 12.5 MG tablet TAKE 1  12.5 MG TOTAL) BY MOUTH 2 (TWO) TIMES DAILY. 02/11/19   Gollan, Timothy J, MD    °ENTRESTO 49-51 MG TAKE 1 TABLET BY MOUTH TWICE A DAY 05/16/18   Gollan, Timothy J, MD  °JARDIANCE 10 MG TABS tablet Take 10 mg by mouth daily. 01/04/19   [provider]  °metFORMIN (GLUCOPHAGE) 1000 MG tablet Take 1,000 mg by mouth 2 (two) times daily with a meal.  06/05/15   [provider]  °niacin (NIASPAN) 1000 MG CR tablet TAKE 1 TABLET BY MOUTH AT BEDTIME 07/20/18   Gollan, Timothy J, MD  °nitroGLYCERIN (NITROSTAT) 0.4 MG SL tablet Place 0.4 mg under the tongue every 5 (five) minutes as needed (MAX 3 TABLETS). FOR CHEST PAIN    [provider]  °omeprazole (PRILOSEC) 20 MG capsule Take 20 mg by mouth daily.     [provider]  °ONETOUCH VERIO test strip USE 1 EACH (1 STRIP TOTAL) 3 (THREE) TIMES DAILY USE AS INSTRUCTED. 01/09/19   [provider]  °potassium chloride (K-DUR) 10 MEQ tablet TAKE 1 TABLET BY MOUTH EVERY DAY 04/23/18   Gollan, Timothy J, MD  °terbinafine (LAMISIL) 250 MG tablet Take 1 tablet (250 mg total) by mouth daily. 02/20/19   Hyatt, Max T, DPM  °torsemide (DEMADEX) 20 MG tablet Take 2 tablets (40 mg total) by mouth as directed. Alternate 20 mg twice a day and 40 mg twice a day 04/13/18   Gollan, Timothy J, MD  °XARELTO 20 MG TABS tablet TAKE 1 TABLET BY MOUTH EVERY DAY WITH LUNCH 11/23/18   Gollan, Timothy J, MD  ° °Allergies  °Allergen Reactions  °• Iodine Other (See Comments)  °  Shortness of breath, swelling and hives  °• Shrimp [Shellfish Allergy] Other (See Comments)  °  SWELLING    HIVES    SHORTNESS OF BREATH  °• Tetracycline Rash  ° ° °FAMILY HISTORY:  °family history includes Breast cancer in his mother; Heart attack in his father; Heart disease in his father. °SOCIAL HISTORY: ° reports that he has never smoked. He has never used smokeless tobacco. He reports that he does not drink alcohol or use drugs. ° ° °REVIEW OF SYSTEMS:  Positives in BOLD °Constitutional: Negative for fever, chills, weight loss, malaise/fatigue and diaphoresis.   °HENT: Negative for hearing loss, ear pain, nosebleeds, congestion, sore throat, neck pain, tinnitus and ear discharge.   °Eyes: Negative for blurred vision, double vision, photophobia, pain, discharge and redness.  °Respiratory: Negative for cough, hemoptysis, sputum production, shortness of breath, wheezing and stridor.   °Cardiovascular: Negative for chest pain, palpitations, orthopnea, claudication, leg swelling and PND.  °Gastrointestinal: Negative for heartburn, nausea, vomiting, abdominal pain, diarrhea, constipation, blood in stool and melena.  °Genitourinary: Negative for dysuria, urgency, frequency, hematuria and flank pain.  °Musculoskeletal: Negative for myalgias, back pain, joint pain and falls.  °Skin: Negative for itching and rash.  °Neurological: Negative for dizziness, tingling, tremors, sensory change, speech change, focal weakness, seizures, loss of consciousness, weakness and headaches.  °Endo/Heme/Allergies: Negative for environmental allergies and polydipsia. Does not bruise/bleed easily. ° °SUBJECTIVE: "I was having difficulty breathing over the last few days. I feel better with this mask on." Pt reports only other significant symptom is increased BLE and abdominal swelling. ° °VITAL SIGNS: °Temp:  [97.5 °F (36.4 °C)-98.6 °F (37 °C)] 98.6 °F (37 °C) (08/03 1628) °Pulse Rate:  [64-89] 82 (08/03 1945) °Resp:  [19-35] 28 (08/03 1945) °BP: (65-131)/(50-115) 92/66 (08/03 1930) °SpO2:  [94 %-97 %]   95 % (08/03 1945) °Weight:  [127 kg] 127 kg (08/03 1417) ° °PHYSICAL EXAMINATION: °General:  Pt lying in bed, comfortable on Bipap °Neuro:  A&O x 4, PERRLA, no focal/sensory deficits °HEENT:  Atraumatic, normocephalic, no scleral icterus °Cardiovascular:  S1, S2, distant sounds, irregular- Afib controlled, +2 pitting edema in BLE, +2 palpable pulses, no JVD °Lungs:  Diminished lung sounds auscultated throughout with crackles, no cough/sputum production, mild accessory muscle use on Bipap °Abdomen:  Soft,  distended, non tender, active bowel sounds °Musculoskeletal:  5/5 strength throughout °Skin:  Warm, dry, BLE abrasions, non tenting, no rashes/ulcerations ° °Recent Labs  °Lab 03/04/19 °1449  °NA 131*  °K 4.4  °CL 96*  °CO2 17*  °BUN 72*  °CREATININE 3.94*  °GLUCOSE 199*  ° °Recent Labs  °Lab 03/04/19 °1414  °HGB 12.1*  °HCT 37.0*  °WBC 31.2*  °PLT 331  ° °Ct Head Wo Contrast ° °Result Date: 03/04/2019 °CLINICAL DATA:  Altered level of consciousness. EXAM: CT HEAD WITHOUT CONTRAST TECHNIQUE: Contiguous axial images were obtained from the base of the skull through the vertex without intravenous contrast. COMPARISON:  09/07/2016 FINDINGS: Brain: As seen previously, the brainstem and cerebellum are normal. There is ventricular prominence without evidence old or acute focal infarction of the cerebral hemispheres. No mass lesion, hemorrhage or extra-axial collection. Ventricular size is unchanged since 2018 and likely secondary to central atrophy. As noted previously, normal pressure hydrocephalus is not excluded. Vascular: No abnormal vascular finding. Skull: Negative Sinuses/Orbits: Clear/normal Other: None IMPRESSION: Chronic ventriculomegaly without evidence of focal brain insult. No visible change since February 2018. This is felt most likely secondary to central atrophy. Normal pressure hydrocephalus is not excluded but not favored. Electronically Signed   By: Mark  Shogry M.D.   On: 03/04/2019 16:16  ° °Dg Chest Port 1 View ° °Result Date: 03/04/2019 °CLINICAL DATA:  Shortness of breath. Respiratory distress. EXAM: PORTABLE CHEST 1 VIEW COMPARISON:  12/10/2017 FINDINGS: There is chronic cardiomegaly. AICD in place. There is a moderate right pleural effusion. Pulmonary vascularity is within normal limits. Suggestion of a small right left effusion but this is not definitive. No acute bone abnormality. IMPRESSION: 1. Moderate right pleural effusion. Possible small left effusion. 2. Chronic cardiomegaly. Electronically  Signed   By: James  Maxwell M.D.   On: 03/04/2019 14:42  ° ° °ASSESSMENT / PLAN: ° °Acute Respiratory Failure with hypoxia secondary to R sided pleural effusion vs CAP °Hx: Combined systolic & diastolic heart failure, OSA °- Supplemental oxygen PRN, maintain SpO2 > 92% °- Bipap overnight 8/3  °- Repeat CXR 8/4 °- Continuous SpO2 monitoring °- Continue Albuterol nebulizer PRN °- IR consulted for possible thoracentisis 8/4 ° °Sepsis with septic shock secondary to unknown etiology °- monitor WBC and fever curve °- monitor procalcitonin trend °- BC & UC pending °- continuous cardiac monitoring °- levophed drip initiated for persistent hypotension s/p 2 L IVF resuscitation °- trend lactic: 2.7 ~ 2.3 °- Continue cefepime, will consider discontinuing vancomycin if MRSA PCR is negative ° °Elevated Troponin secondary to suspected demand ischemia  °Hx: CAD, ICM s/p ICD, PAF, combined CHF °- Trend Troponin: 56 ~ 50 °- continuous cardiac monitoring ° °Acute Renal Failure secondary to hypovolemia vs septic shock °Metabolic Acidosis °- Strict I&O's °- Bladder scan on admission= no urine s/p 20 mg lasix in ED, Q 4 h Bladder scans        overnight °- Daily BMP, avoid nephrotoxic agents °-nephrology consult depending on AM BMP and UOP ° ° °  DISPOSITION:  ICU °GOALS OF CARE:  FULL CODE °VTE Prophylaxis: Heparin SQ °Stress Ulcer Prophylaxis: Protonix IV ° ° ° °Critical Care Time devoted to patient care services described in this note is 43  minutes.  ° °Overall, patient is critically ill, prognosis is guarded.   ° ° David , M.D.  °[facility]  °Medical Director ICU-[facility] °Medical Director [facility]io[facility] Department  ° ° ° ° °

## 2019-03-04 NOTE — H&P (Signed)
[facility] NAME: Colton Lamb    MR#:  [id number]  DATE OF BIRTH:  1951/03/17  DATE OF ADMISSION:  03/04/2019  PRIMARY CARE PHYSICIAN: Toni Arthurs, NP   REQUESTING/REFERRING PHYSICIAN: Dr Kerman Passey  CHIEF COMPLAINT:  increasing shortness of breath for last few days  HISTORY OF PRESENT ILLNESS:  Colton Lamb  is a 68 y.o. male with a known history of chronic systolic heart failure EF of 30 to 35%, atrial flutter status post cardioversion on Xarelto, CAD, Gerd, hypertension, paroxysmal if it comes to the emergency room with increasing shortness of breath and cough. Patient was found to have elevated white count of 31,000, tachycardia, elevator lactic acid in chest x-ray suggestive of left lower lobe pneumonia with right-sided pleural effusion. he was also noted to have BNP of 972. He was found to be hypoxic. He does not use oxygen at home. His placed on by.  Patient is being admitted for acute hypoxic respiratory failure secondary to CHF acute on chronic systolic with right pleural effusion and possible left lower lobe pneumonia patient also has acute on chronic renal failure. His baseline creatinine is 1.02. His creatinine today is 3.94.  He Received broad-spectrum antibiotics in the emergency room. No family the ER. Patient is being admitted for further evaluation management. PAST MEDICAL HISTORY:   Past Medical History:  Diagnosis Date  . Atrial flutter ([name])    a. s/p Cardioversion 11/22/13, on amiodarone and Xarelto.  . Chronic systolic heart failure ([place])    a. 10/2013 EF 20-25%, grade III DD, RV mildly dilated and sys fx mild/mod reduced;  b. 01/2014 Echo: EF 30-35%, gr3 DD, mod dil LA.  Marland Kitchen Coronary artery disease    a. s/p MI 2007/2015;  b. s/p prior PCI to the LAD/LCX/PDA/PL;  c. 2008: s/p Cypher DES to the OM.  Marland Kitchen Crohn's ileocolitis ([place])   . GERD (gastroesophageal reflux disease)   . Hx of adenomatous  colonic polyps 11/2003  . Hyperlipidemia   . Hypertension   . Ischemic cardiomyopathy    s. 01/2014 s/p MDT [id number] [name] XT DR single lead AICD.  Marland Kitchen Obesity   . Paroxysmal atrial fibrillation ([facility])    a. CHA2DS2VASc = 4-->xarelto/amio.  . Sleep apnea   . Syncope    a.  11/2013 in setting of volume depletion and bradycardia due to dig toxicity   . Type II diabetes mellitus ([place])     PAST SURGICAL HISTOIRY:   Past Surgical History:  Procedure Laterality Date  . ATRIAL FLUTTER ABLATION N/A 04/16/2014   Procedure: ATRIAL FLUTTER ABLATION;  Surgeon: Evans Lance, MD;  Location: [facility] CATH LAB;  Service: Cardiovascular;  Laterality: N/A;  . CARDIAC CATHETERIZATION  10/2013  . CARDIAC DEFIBRILLATOR PLACEMENT  04/16/2014   Medtronic Evira device  . CARDIAC ELECTROPHYSIOLOGY STUDY AND ABLATION  04/16/2014   atrial flutter ablation  . CARDIOVERSION N/A 03/05/2014   Procedure: CARDIOVERSION;  Surgeon: Jolaine Artist, MD;  Location: [facility] ENDOSCOPY;  Service: Cardiovascular;  Laterality: N/A;  . CATARACT EXTRACTION W/PHACO Right 01/04/2017   Procedure: CATARACT EXTRACTION PHACO AND INTRAOCULAR LENS PLACEMENT ([place])  Right Diabetic Complicated;  Surgeon: Leandrew Koyanagi, MD;  Location: [place];  Service: Ophthalmology;  Laterality: Right;  Diabetic  . CATARACT EXTRACTION W/PHACO Left 02/08/2017   Procedure: CATARACT EXTRACTION PHACO AND INTRAOCULAR LENS PLACEMENT ([place]) left diabetic;  Surgeon: Leandrew Koyanagi, MD;  Location: [facility];  Service: Ophthalmology;  Laterality: Left;  Diabetic - oral meds sleep apnea  . CORONARY ANGIOPLASTY WITH STENT PLACEMENT  2007; 2008 X 2   "1+1 ~ 1"  . FOOT SURGERY Left    bone spur  . HYDROCELE EXCISION Bilateral   . 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 IMPLANTABLE CARDIOVERTER DEFIBRILLATOR IMPLANT N/A 04/16/2014   Procedure: IMPLANTABLE CARDIOVERTER DEFIBRILLATOR IMPLANT;  Surgeon: Evans Lance, MD;   Location: [facility] CATH LAB;  Service: Cardiovascular;  Laterality: N/A;  . LEFT HEART CATHETERIZATION WITH CORONARY ANGIOGRAM N/A 11/22/2013   Procedure: LEFT HEART CATHETERIZATION WITH CORONARY ANGIOGRAM;  Surgeon: Sinclair Grooms, MD;  Location: [facility] CATH LAB;  Service: Cardiovascular;  Laterality: N/A;    SOCIAL HISTORY:   Social History   Tobacco Use  . Smoking status: Never Smoker  . Smokeless tobacco: Never Used  Substance Use Topics  . Alcohol use: No    FAMILY HISTORY:   Family History  Problem Relation Age of Onset  . Breast cancer Mother   . Heart disease Father   . Heart attack Father   . Colon cancer Neg Hx     DRUG ALLERGIES:   Allergies  Allergen Reactions  . Iodine Other (See Comments)    Shortness of breath, swelling and hives  . Shrimp [Shellfish Allergy] Other (See Comments)    SWELLING    HIVES    SHORTNESS OF BREATH  . Tetracycline Rash    REVIEW OF SYSTEMS:  Review of Systems  Constitutional: Positive for fever and malaise/fatigue. Negative for chills and weight loss.  HENT: Negative for ear discharge, ear pain and nosebleeds.   Eyes: Negative for blurred vision, pain and discharge.  Respiratory: Positive for cough, sputum production and shortness of breath. Negative for wheezing and stridor.   Cardiovascular: Negative for chest pain, palpitations, orthopnea and PND.  Gastrointestinal: Negative for abdominal pain, diarrhea, nausea and vomiting.  Genitourinary: Negative for frequency and urgency.  Musculoskeletal: Positive for back pain. Negative for joint pain.  Neurological: Positive for weakness. Negative for sensory change, speech change and focal weakness.  Psychiatric/Behavioral: Negative for depression and hallucinations. The patient is not nervous/anxious.      MEDICATIONS AT HOME:   Prior to Admission medications   Medication Sig Start Date End Date Taking? Authorizing Provider  acetaminophen (TYLENOL) 325 MG tablet Take 1-2 tablets  (325-650 mg total) by mouth every 4 (four) hours as needed for mild pain. 04/17/14   [name], NP  amiodarone (PACERONE) 200 MG tablet Take 1 tablet (200 mg total) once daily M-F and 0.5 tablet (100 mg total) Saturday and Sunday. 01/10/19   Rise Mu, PA-C  aspirin 81 MG tablet Take 81 mg by mouth daily.     [provider]  atorvastatin (LIPITOR) 80 MG tablet TAKE 1 TABLET BY MOUTH EVERY DAY 12/11/18   Gollan, Kathlene November, MD  balsalazide (COLAZAL) 750 MG capsule TAKE 1 CAPSULE (750 MG TOTAL) BY MOUTH 3 (THREE) TIMES DAILY. 11/28/18   Ladene Artist, MD  Blood Glucose Monitoring Suppl ([place]) w/Device KIT See admin instructions. 01/08/19   [provider]  carvedilol (COREG) 12.5 MG tablet TAKE 1 TABLET (12.5 MG TOTAL) BY MOUTH 2 (TWO) TIMES DAILY. 02/11/19   Minna Merritts, MD  ENTRESTO 49-51 MG TAKE 1 TABLET BY MOUTH TWICE A DAY 05/16/18   Gollan, Kathlene November, MD  JARDIANCE 10 MG TABS tablet Take 10 mg by mouth daily. 01/04/19   [provider]  metFORMIN (GLUCOPHAGE) 1000 MG tablet Take 1,000 mg by mouth 2 (two) times daily with a meal.  06/05/15   [provider]  niacin (NIASPAN) 1000 MG CR tablet TAKE 1 TABLET BY MOUTH AT BEDTIME 07/20/18   Gollan, Kathlene November, MD  nitroGLYCERIN (NITROSTAT) 0.4 MG SL tablet Place 0.4 mg under the tongue every 5 (five) minutes as needed (MAX 3 TABLETS). FOR CHEST PAIN    [provider]  omeprazole (PRILOSEC) 20 MG capsule Take 20 mg by mouth daily.     [provider]  ONETOUCH VERIO test strip USE 1 EACH (1 STRIP TOTAL) 3 (THREE) TIMES DAILY USE AS INSTRUCTED. 01/09/19   [provider]  potassium chloride (K-DUR) 10 MEQ tablet TAKE 1 TABLET BY MOUTH EVERY DAY 04/23/18   Minna Merritts, MD  terbinafine (LAMISIL) 250 MG tablet Take 1 tablet (250 mg total) by mouth daily. 02/20/19   Hyatt, Max T, DPM  torsemide (DEMADEX) 20 MG tablet Take 2 tablets (40 mg total) by mouth as  directed. Alternate 20 mg twice a day and 40 mg twice a day 04/13/18   Gollan, Kathlene November, MD  XARELTO 20 MG TABS tablet TAKE 1 TABLET BY MOUTH EVERY DAY WITH LUNCH 11/23/18   Minna Merritts, MD      VITAL SIGNS:  Blood pressure (!) 82/55, pulse 72, temperature 98.6 F (37 C), temperature source Rectal, resp. rate (!) 35, height 6' (1.829 m), weight 127 kg, SpO2 95 %.  PHYSICAL EXAMINATION:  GENERAL:  68 y.o.-year-old patient lying in the bed with no acute distress.  EYES: Pupils equal, round, reactive to light and accommodation. No scleral icterus. Extraocular muscles intact.  HEENT: Head atraumatic, normocephalic. Oropharynx and nasopharynx clear.  NECK:  Supple, no jugular venous distention. No thyroid enlargement, no tenderness.  LUNGS: Normal breath sounds bilaterally, no wheezing, rales,rhonchi or crepitation. No use of accessory muscles of respiration.  CARDIOVASCULAR: S1, S2 normal. No murmurs, rubs, or gallops.  ABDOMEN: Soft, nontender, nondistended. Bowel sounds present. No organomegaly or mass.  EXTREMITIES: No pedal edema, cyanosis, or clubbing.  NEUROLOGIC: Cranial nerves II through XII are intact. Muscle strength 5/5 in all extremities. Sensation intact. Gait not checked.  PSYCHIATRIC: The patient is alert and oriented x 3.  SKIN: No obvious rash, lesion, or ulcer.   LABORATORY PANEL:   CBC Recent Labs  Lab 03/04/19 1414  WBC 31.2*  HGB 12.1*  HCT 37.0*  PLT 331   ------------------------------------------------------------------------------------------------------------------  Chemistries  No results for input(s): NA, K, CL, CO2, GLUCOSE, BUN, CREATININE, CALCIUM, MG, AST, ALT, ALKPHOS, BILITOT in the last 168 hours.  Invalid input(s): GFRCGP ------------------------------------------------------------------------------------------------------------------  Cardiac Enzymes No results for input(s): TROPONINI in the last 168  hours. ------------------------------------------------------------------------------------------------------------------  RADIOLOGY:  Ct Head Wo Contrast  Result Date: 03/04/2019 CLINICAL DATA:  Altered level of consciousness. EXAM: CT HEAD WITHOUT CONTRAST TECHNIQUE: Contiguous axial images were obtained from the base of the skull through the vertex without intravenous contrast. COMPARISON:  09/07/2016 FINDINGS: Brain: As seen previously, the brainstem and cerebellum are normal. There is ventricular prominence without evidence old or acute focal infarction of the cerebral hemispheres. No mass lesion, hemorrhage or extra-axial collection. Ventricular size is unchanged since 2018 and likely secondary to central atrophy. As noted previously, normal pressure hydrocephalus is not excluded. Vascular: No abnormal vascular finding. Skull: Negative Sinuses/Orbits: Clear/normal Other: None IMPRESSION: Chronic ventriculomegaly without evidence of focal brain insult. No visible change since February 2018. This is felt most  likely secondary to central atrophy. Normal pressure hydrocephalus is not excluded but not favored. Electronically Signed   By: Nelson Chimes M.D.   On: 03/04/2019 16:16   Dg Chest Port 1 View  Result Date: 03/04/2019 CLINICAL DATA:  Shortness of breath. Respiratory distress. EXAM: PORTABLE CHEST 1 VIEW COMPARISON:  12/10/2017 FINDINGS: There is chronic cardiomegaly. AICD in place. There is a moderate right pleural effusion. Pulmonary vascularity is within normal limits. Suggestion of a small right left effusion but this is not definitive. No acute bone abnormality. IMPRESSION: 1. Moderate right pleural effusion. Possible small left effusion. 2. Chronic cardiomegaly. Electronically Signed   By: Lorriane Shire M.D.   On: 03/04/2019 14:42    EKG:    IMPRESSION AND PLAN:   Takeru Bose  is a 68 y.o. male with a known history of chronic systolic heart failure EF of 30 to 35%, atrial flutter  status post cardioversion on Xarelto, CAD, Gerd, hypertension, paroxysmal if it comes to the emergency room with increasing shortness of breath and cough.  1.sepsis suspected due to left lower lobe pneumonia and right pleural effusion -admit to ICU -discussed with Dr. Mortimer Fries -IV vancomycin and cefepime-- de-escalate antibiotics once cultures are back -follow white count and fever curve  2. ACute hypoxic respiratory failure secondary to acute on chronic CHF systolic -IV Lasix 20 mg BID -monitor creatinine if continues to rise consider nephrology consultation -continue Coreg -patient follows with Dr. Rockey Situ consider cardiology consultation  3. Acute on chronic renal failure stage II -monitor creatinine if continues to rise consider nephrology consultation -continue Coreg -patient's baseline creatinine is 1.6 -comes in with creatinine of 3.94 -avoid nephrotoxic agents  4. Leukocytosis  5. Right-sided pleural effusion in the setting of CHF -ultrasound-guided paracentesis  6. History of chronic atrial fibrillation  on Xarelto -I will hold Xarelto since patient is going to get paracentesis resume after the procedure -continue Coreg  7. Diabetes type II -scale insulin -hold meds  8. DVT prophylaxis patient will be started back on Xarelto once paracentesis is completed  All the records are reviewed and case discussed with ED provider.   CODE STATUS: full  TOTAL TIME TAKING CARE OF THIS PATIENT: *55* minutes.    [name] M.D on 03/04/2019 at 6:06 PM  Between 7am to 6pm - Pager - [phone]  After 6pm go to [email or web address] - password [email or web address] [facility]  [facility]  Office  [phone]  CC: Primary care physician; Toni Arthurs, NP

## 2019-03-04 NOTE — ED Notes (Signed)
Spoke with Ginette Otto, pt's daughter in law with pt's permission

## 2019-03-04 NOTE — ED Provider Notes (Signed)
-----------------------------------------   5:21 PM on 03/04/2019 -----------------------------------------  Patient care assumed from Dr. Charna Archer.  Patient currently on BiPAP sat of 95%.  X-ray shows moderate to large pleural effusion.  White count 31, hypotensive, continues to receive IV fluids.  Receiving IV antibiotics per sepsis protocols.  Patient will require admission to the hospital service for further treatment and work-up.  At this time no obvious source, urine does not appear overly infected, CT head negative for acute abnormality.  Chest x-ray does not appear to show infectious etiology.  [place] test is negative.  BNP is quite elevated we are giving fluids, but will be somewhat restrictive given hypoxia on BiPAP with elevated BNP, do not want to fluid overload the patient.   Harvest Dark, MD 03/04/19 1724

## 2019-03-04 NOTE — ED Notes (Signed)
Pt arrives from home via [facility] for SOB. Pt given duoneb, albuterol via neb and solumedrol 125 mg in route. Pt arrives on CPAP and 15 L NRB. Pt given 241m NS in route. BG 206 via EMS. Pt unable to answer questions during triage. Pt on bipap at this time, O2 saturation 95%.

## 2019-03-04 NOTE — ED Notes (Signed)
Lactic acid hemolyzed, lab to come and draw lactic acid level

## 2019-03-04 NOTE — Progress Notes (Signed)
PHARMACY -  BRIEF ANTIBIOTIC NOTE   Pharmacy has received consult(s) for Cefepime and Vancomycin from an ED provider.  The patient's profile has been reviewed for ht/wt/allergies/indication/available labs.    One time order(s) placed for Cefepime 2 gm and Vancomycin 1 gram  Further antibiotics/pharmacy consults should be ordered by admitting physician if indicated.                       Thank you, Josanne Boerema A 03/04/2019  3:16 PM

## 2019-03-05 ENCOUNTER — Inpatient Hospital Stay: Payer: Medicare Other

## 2019-03-05 DIAGNOSIS — E872 Acidosis: Secondary | ICD-10-CM

## 2019-03-05 DIAGNOSIS — N179 Acute kidney failure, unspecified: Secondary | ICD-10-CM

## 2019-03-05 DIAGNOSIS — R34 Anuria and oliguria: Secondary | ICD-10-CM

## 2019-03-05 LAB — BASIC METABOLIC PANEL
Anion gap: 18 — ABNORMAL HIGH (ref 5–15)
BUN: 91 mg/dL — ABNORMAL HIGH (ref 8–23)
CO2: 17 mmol/L — ABNORMAL LOW (ref 22–32)
Calcium: 8 mg/dL — ABNORMAL LOW (ref 8.9–10.3)
Chloride: 96 mmol/L — ABNORMAL LOW (ref 98–111)
Creatinine, Ser: 4.82 mg/dL — ABNORMAL HIGH (ref 0.61–1.24)
GFR calc Af Amer: 13 mL/min — ABNORMAL LOW (ref >60)
GFR calc non Af Amer: 12 mL/min — ABNORMAL LOW (ref >60)
Glucose, Bld: 224 mg/dL — ABNORMAL HIGH (ref 70–99)
Potassium: 4.4 mmol/L (ref 3.5–5.1)
Sodium: 131 mmol/L — ABNORMAL LOW (ref 135–145)

## 2019-03-05 LAB — ALBUMIN, PLEURAL OR PERITONEAL FLUID: Albumin, Fluid: <1 g/dL

## 2019-03-05 LAB — LACTATE DEHYDROGENASE, PLEURAL OR PERITONEAL FLUID: LD, Fluid: 5463 U/L — ABNORMAL HIGH (ref 3–23)

## 2019-03-05 LAB — BLOOD GAS, ARTERIAL
Acid-base deficit: 8.2 mmol/L — ABNORMAL HIGH (ref 0.0–2.0)
Bicarbonate: 16 mmol/L — ABNORMAL LOW (ref 20.0–28.0)
Delivery systems: POSITIVE
Expiratory PAP: 8
FIO2: 0.3
Inspiratory PAP: 18
O2 Saturation: 96.5 %
Patient temperature: 37
RATE: 10 resp/min
pCO2 arterial: 29 mmHg — ABNORMAL LOW (ref 32.0–48.0)
pH, Arterial: 7.35 (ref 7.350–7.450)
pO2, Arterial: 90 mmHg (ref 83.0–108.0)

## 2019-03-05 LAB — CBC
HCT: 35.9 % — ABNORMAL LOW (ref 39.0–52.0)
Hemoglobin: 11.8 g/dL — ABNORMAL LOW (ref 13.0–17.0)
MCH: 28 pg (ref 26.0–34.0)
MCHC: 32.9 g/dL (ref 30.0–36.0)
MCV: 85.3 fL (ref 80.0–100.0)
Platelets: 364 10*3/uL (ref 150–400)
RBC: 4.21 MIL/uL — ABNORMAL LOW (ref 4.22–5.81)
RDW: 15.7 % — ABNORMAL HIGH (ref 11.5–15.5)
WBC: 39 10*3/uL — ABNORMAL HIGH (ref 4.0–10.5)
nRBC: 0 % (ref 0.0–0.2)

## 2019-03-05 LAB — BODY FLUID CELL COUNT WITH DIFFERENTIAL: Total Nucleated Cell Count, Fluid: 17693 cu mm

## 2019-03-05 LAB — GLUCOSE, CAPILLARY
Glucose-Capillary: 155 mg/dL — ABNORMAL HIGH (ref 70–99)
Glucose-Capillary: 184 mg/dL — ABNORMAL HIGH (ref 70–99)
Glucose-Capillary: 190 mg/dL — ABNORMAL HIGH (ref 70–99)
Glucose-Capillary: 207 mg/dL — ABNORMAL HIGH (ref 70–99)
Glucose-Capillary: 229 mg/dL — ABNORMAL HIGH (ref 70–99)

## 2019-03-05 LAB — AMYLASE, PLEURAL OR PERITONEAL FLUID: Amylase, Fluid: 10 U/L

## 2019-03-05 LAB — PROTEIN, PLEURAL OR PERITONEAL FLUID: Total protein, fluid: <3 g/dL

## 2019-03-05 LAB — HEMOGLOBIN A1C
Hgb A1c MFr Bld: 6.8 % — ABNORMAL HIGH (ref 4.8–5.6)
Mean Plasma Glucose: 148.46 mg/dL

## 2019-03-05 LAB — PHOSPHORUS: Phosphorus: 7.9 mg/dL — ABNORMAL HIGH (ref 2.5–4.6)

## 2019-03-05 LAB — GLUCOSE, PLEURAL OR PERITONEAL FLUID: Glucose, Fluid: <20 mg/dL

## 2019-03-05 LAB — PROCALCITONIN: Procalcitonin: 31.81 ng/mL

## 2019-03-05 MED ORDER — INSULIN ASPART 100 UNIT/ML ~~LOC~~ SOLN
0.0000 [IU] | SUBCUTANEOUS | Status: DC
  Administered 2019-03-05 (×2): 3 [IU] via SUBCUTANEOUS
  Administered 2019-03-05 (×2): 2 [IU] via SUBCUTANEOUS
  Administered 2019-03-06 (×3): 3 [IU] via SUBCUTANEOUS
  Administered 2019-03-06: 2 [IU] via SUBCUTANEOUS
  Administered 2019-03-06 (×2): 3 [IU] via SUBCUTANEOUS
  Administered 2019-03-07: 2 [IU] via SUBCUTANEOUS
  Administered 2019-03-07 (×2): 5 [IU] via SUBCUTANEOUS
  Administered 2019-03-07 (×2): 3 [IU] via SUBCUTANEOUS
  Filled 2019-03-05 (×15): qty 1

## 2019-03-05 MED ORDER — SODIUM CHLORIDE 0.9 % IV SOLN
INTRAVENOUS | Status: DC | PRN
  Administered 2019-03-05 – 2019-03-17 (×3): 250 mL via INTRAVENOUS

## 2019-03-05 NOTE — Progress Notes (Signed)
Hemodialysis completed without issue. 2 hour treatment. No uf as ordered. No changes from previous assessment. Report given to primary RN. Patient tolerated well.

## 2019-03-05 NOTE — Consult Note (Signed)
[facility]  CONSULT NOTE    Date: 03/05/2019                  Patient Name:  Colton Lamb  MRN: [id number]  DOB: 02-27-1951  Age / Sex: 68 y.o., male         PCP: Toni Arthurs, NP                 Service Requesting Consult: Dr. Mortimer Fries                 Reason for Consult: Acute renal failure            History of Present Illness: Colton Lamb admitted to [facility] ICU on 03/04/2019 for sepsis, respiratory failure on BIPAP.  Found to have dyspnea and increasing peripheral edema. Found to have sepsis from pneumonia. Started on norepinephrine.    Medications: Outpatient medications: Medications Prior to Admission  Medication Sig Dispense Refill Last Dose  . acetaminophen (TYLENOL) 325 MG tablet Take 1-2 tablets (325-650 mg total) by mouth every 4 (four) hours as needed for mild pain.     Marland Kitchen amiodarone (PACERONE) 200 MG tablet Take 1 tablet (200 mg total) once daily M-F and 0.5 tablet (100 mg total) Saturday and Sunday. 90 tablet 3   . aspirin 81 MG tablet Take 81 mg by mouth daily.      Marland Kitchen atorvastatin (LIPITOR) 80 MG tablet TAKE 1 TABLET BY MOUTH EVERY DAY 90 tablet 0   . balsalazide (COLAZAL) 750 MG capsule TAKE 1 CAPSULE (750 MG TOTAL) BY MOUTH 3 (THREE) TIMES DAILY. 270 capsule 3   . Blood Glucose Monitoring Suppl ([place]) w/Device KIT See admin instructions.     . carvedilol (COREG) 12.5 MG tablet TAKE 1 TABLET (12.5 MG TOTAL) BY MOUTH 2 (TWO) TIMES DAILY. 180 tablet 3   . ENTRESTO 49-51 MG TAKE 1 TABLET BY MOUTH TWICE A DAY 60 tablet 9   . JARDIANCE 10 MG TABS tablet Take 10 mg by mouth daily.     . metFORMIN (GLUCOPHAGE) 1000 MG tablet Take 1,000 mg by mouth 2 (two) times daily with a meal.      . niacin (NIASPAN) 1000 MG CR tablet TAKE 1 TABLET BY MOUTH AT BEDTIME 90 tablet 3   . nitroGLYCERIN (NITROSTAT) 0.4 MG SL tablet Place 0.4 mg under the tongue every 5 (five) minutes as needed (MAX 3 TABLETS). FOR CHEST PAIN     .  omeprazole (PRILOSEC) 20 MG capsule Take 20 mg by mouth daily.      Glory Rosebush VERIO test strip USE 1 EACH (1 STRIP TOTAL) 3 (THREE) TIMES DAILY USE AS INSTRUCTED.     Marland Kitchen potassium chloride (K-DUR) 10 MEQ tablet TAKE 1 TABLET BY MOUTH EVERY DAY 90 tablet 2   . terbinafine (LAMISIL) 250 MG tablet Take 1 tablet (250 mg total) by mouth daily. 30 tablet 0   . torsemide (DEMADEX) 20 MG tablet Take 2 tablets (40 mg total) by mouth as directed. Alternate 20 mg twice a day and 40 mg twice a day 360 tablet 3   . XARELTO 20 MG TABS tablet TAKE 1 TABLET BY MOUTH EVERY DAY WITH LUNCH 90 tablet 1     Current medications: Current Facility-Administered Medications  Medication Dose Route Frequency Provider Last Rate Last Dose  . 0.9 %  sodium chloride infusion   Intravenous PRN Flora Lipps, MD 5 mL/hr at 03/05/19 0754 250 mL at  03/05/19 0754  . acetaminophen (TYLENOL) tablet 650 mg  650 mg Oral Q6H PRN [name], MD       Or  . acetaminophen (TYLENOL) suppository 650 mg  650 mg Rectal Q6H PRN [name], MD      . albuterol (PROVENTIL) (2.5 MG/3ML) 0.083% nebulizer solution 2.5 mg  2.5 mg Nebulization Q2H PRN [name], MD      . aspirin EC tablet 81 mg  81 mg Oral Daily [name], MD      . atorvastatin (LIPITOR) tablet 80 mg  80 mg Oral Daily [name], MD      . ceFEPIme (MAXIPIME) 2 g in sodium chloride 0.9 % 100 mL IVPB  2 g Intravenous Q24H [name], MD      . Chlorhexidine Gluconate Cloth 2 % PADS 6 each  6 each Topical Q0600 [name], MD   6 each at 03/05/19 0534  . heparin injection 5,000 Units  5,000 Units Subcutaneous Q8H [name], MD   5,000 Units at 03/05/19 0534  . insulin aspart (novoLOG) injection 0-9 Units  0-9 Units Subcutaneous Q4H Awilda Bill, NP   3 Units at 03/05/19 7494  . niacin (SLO-NIACIN) CR tablet 1,000 mg  1,000 mg Oral QHS Charlett Nose, RPH      . nitroGLYCERIN (NITROSTAT) SL tablet 0.4 mg  0.4 mg Sublingual Q5 min PRN [name], MD      .  norepinephrine (LEVOPHED) 51m in 2573mpremix infusion  0-40 mcg/min Intravenous Titrated BlAwilda BillNP 11.25 mL/hr at 03/05/19 0700 3 mcg/min at 03/05/19 0700  . ondansetron (ZOFRAN) tablet 4 mg  4 mg Oral Q6H PRN PaFritzi MandesMD       Or  . ondansetron ([facility] Hospital[facility] 4 mg  4 mg Intravenous Q6H PRN PaFritzi MandesMD      . pantoprazole (PROTONIX) injection 40 mg  40 mg Intravenous Daily BlAwilda BillNP   40 mg at 03/04/19 2201  . polyethylene glycol (MIRALAX / GLYCOLAX) packet 17 g  17 g Oral Daily PRN PaFritzi MandesMD      . potassium chloride (K-DUR) CR tablet 10 mEq  10 mEq Oral Daily SiCharlett NoseRPH          Allergies: Allergies  Allergen Reactions  . Iodine Other (See Comments)    Shortness of breath, swelling and hives  . Shrimp [Shellfish Allergy] Other (See Comments)    SWELLING    HIVES    SHORTNESS OF BREATH  . Tetracycline Rash      Past Medical History: Past Medical History:  Diagnosis Date  . Atrial flutter ([place]   a. s/p Cardioversion 11/22/13, on amiodarone and Xarelto.  . Chronic systolic heart failure ([facility]Bag[place]   a. 10/2013 EF 20-25%, grade III DD, RV mildly dilated and sys fx mild/mod reduced;  b. 01/2014 Echo: EF 30-35%, gr3 DD, mod dil LA.  . Marland Kitchenoronary artery disease    a. s/p MI 2007/2015;  b. s/p prior PCI to the LAD/LCX/PDA/PL;  c. 2008: s/p Cypher DES to the OM.  . Marland Kitchenrohn's ileocolitis (HCC[facility] [place]  . GERD (gastroesophageal reflux disease)   . Hx of adenomatous colonic polyps 11/2003  . Hyperlipidemia   . Hypertension   . Ischemic cardiomyopathy    s. 01/2014 s/p MDT [id number] EvGwyneth RevelsT DR single lead AICD.  . Marland Kitchenbesity   . Paroxysmal atrial fibrillation (HCC)    a. CHA2DS2VASc = 4-->xarelto/amio.  . Sleep apnea   .  Syncope    a.  11/2013 in setting of volume depletion and bradycardia due to dig toxicity   . Type II diabetes mellitus ([place])      Past Surgical History: Past Surgical History:  Procedure Laterality Date  . ATRIAL FLUTTER ABLATION  N/A 04/16/2014   Procedure: ATRIAL FLUTTER ABLATION;  Surgeon: Evans Lance, MD;  Location: [facility] C[facility] LAB;  Service: Cardiovascular;  Laterality: N/A;  . CARDIAC CATHETERIZATION  10/2013  . CARDIAC DEFIBRILLATOR PLACEMENT  04/16/2014   Medtronic Evira device  . CARDIAC ELECTROPHYSIOLOGY STUDY AND ABLATION  04/16/2014   atrial flutter ablation  . CARDIOVERSION N/A 03/05/2014   Procedure: CARDIOVERSION;  Surgeon: Jolaine Artist, MD;  Location: [facility] ENDOSCOPY;  Service: Cardiovascular;  Laterality: N/A;  . CATARACT EXTRACTION W/PHACO Right 01/04/2017   Procedure: CATARACT EXTRACTION PHACO AND INTRAOCULAR LENS PLACEMENT ([place])  Right Diabetic Complicated;  Surgeon: Leandrew Koyanagi, MD;  Location: [place];  Service: Ophthalmology;  Laterality: Right;  Diabetic  . CATARACT EXTRACTION W/PHACO Left 02/08/2017   Procedure: CATARACT EXTRACTION PHACO AND INTRAOCULAR LENS PLACEMENT ([place]) left diabetic;  Surgeon: Leandrew Koyanagi, MD;  Location: [place];  Service: Ophthalmology;  Laterality: Left;  Diabetic - oral meds sleep apnea  . CORONARY ANGIOPLASTY WITH STENT PLACEMENT  2007; 2008 X 2   "1+1 ~ 1"  . FOOT SURGERY Left    bone spur  . HYDROCELE EXCISION Bilateral   . 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 IMPLANTABLE CARDIOVERTER DEFIBRILLATOR IMPLANT N/A 04/16/2014   Procedure: IMPLANTABLE CARDIOVERTER DEFIBRILLATOR IMPLANT;  Surgeon: Evans Lance, MD;  Location: [facility] CATH LAB;  Service: Cardiovascular;  Laterality: N/A;  . LEFT HEART CATHETERIZATION WITH CORONARY ANGIOGRAM N/A 11/22/2013   Procedure: LEFT HEART CATHETERIZATION WITH CORONARY ANGIOGRAM;  Surgeon: Sinclair Grooms, MD;  Location: [facility] CATH LAB;  Service: Cardiovascular;  Laterality: N/A;     Family History: Family History  Problem Relation Age of Onset  . Breast cancer Mother   . Heart disease Father   . Heart attack Father   . Colon cancer Neg Hx      Social  History: Social History   Socioeconomic History  . Marital status: Widowed    Spouse name: Not on file  . Number of children: 1  . Years of education: Not on file  . Highest education level: Not on file  Occupational History  . Occupation: retired    Fish farm manager: RETIRED  Social Needs  . Financial resource strain: Not on file  . Food insecurity    Worry: Not on file    Inability: Not on file  . Transportation needs    Medical: Not on file    Non-medical: Not on file  Tobacco Use  . Smoking status: Never Smoker  . Smokeless tobacco: Never Used  Substance and Sexual Activity  . Alcohol use: No  . Drug use: No  . Sexual activity: Never  Lifestyle  . Physical activity    Days per week: Not on file    Minutes per session: Not on file  . Stress: Not on file  Relationships  . Social Herbalist on phone: Not on file    Gets together: Not on file    Attends religious service: Not on file    Active member of club or organization: Not on file    Attends meetings of clubs or organizations: Not on file    Relationship status: Not on file  .  Intimate partner violence    Fear of current or ex partner: Not on file    Emotionally abused: Not on file    Physically abused: Not on file    Forced sexual activity: Not on file  Other Topics Concern  . Not on file  Social History Narrative  . Not on file     Review of Systems: Review of Systems  Unable to perform ROS: Critical illness    Vital Signs: Blood pressure 104/67, pulse 88, temperature 98.3 F (36.8 C), temperature source Axillary, resp. rate (!) 34, height 6' (1.829 m), weight 127 kg, SpO2 94 %.  Weight trends: Filed Weights   03/04/19 1417  Weight: 127 kg    Physical Exam: General: Critically ill  Head: Normocephalic, atraumatic. Moist oral mucosal membranes  Eyes: Anicteric, PERRL  Neck: Supple, obese  Lungs:  Bilateral diminished, BIPAP  Heart: Irregular, +pacemaker  Abdomen:  Soft, nontender,  +obese  Extremities:  + peripheral edema.  Neurologic: Nonfocal, moving all four extremities  Skin: No lesions  Access: none     Lab results: Basic Metabolic Panel: Recent Labs  Lab 03/04/19 1449 03/04/19 2109 03/05/19 0526  NA 131*  --  131*  K 4.4  --  4.4  CL 96*  --  96*  CO2 17*  --  17*  GLUCOSE 199*  --  224*  BUN 72*  --  91*  CREATININE 3.94* 4.38* 4.82*  CALCIUM 7.6*  --  8.0*    Liver Function Tests: No results for input(s): AST, ALT, ALKPHOS, BILITOT, PROT, ALBUMIN in the last 168 hours. No results for input(s): LIPASE, AMYLASE in the last 168 hours. No results for input(s): AMMONIA in the last 168 hours.  CBC: Recent Labs  Lab 03/04/19 1414 03/05/19 0526  WBC 31.2* 39.0*  NEUTROABS 27.8*  --   HGB 12.1* 11.8*  HCT 37.0* 35.9*  MCV 85.1 85.3  PLT 331 364    Cardiac Enzymes: No results for input(s): CKTOTAL, CKMB, CKMBINDEX, TROPONINI in the last 168 hours.  BNP: Invalid input(s): POCBNP  CBG: Recent Labs  Lab 03/04/19 2018 03/05/19 0755  GLUCAP 212* 229*    Microbiology: Results for orders placed or performed during the hospital encounter of 03/04/19  SARS Coronavirus 2 [facility] order, Performed in [facility] hospital lab) Nasopharyngeal Nasopharyngeal Swab     Status: None   Collection Time: 03/04/19  2:17 PM   Specimen: Nasopharyngeal Swab  Result Value Ref Range Status   SARS Coronavirus 2 NEGATIVE NEGATIVE Final    Comment: (NOTE) If result is NEGATIVE SARS-CoV-2 target nucleic acids are NOT DETECTED. The SARS-CoV-2 RNA is generally detectable in upper and lower  respiratory specimens during the acute phase of infection. The lowest  concentration of SARS-CoV-2 viral copies this assay can detect is 250  copies / mL. A negative result does not preclude SARS-CoV-2 infection  and should not be used as the sole basis for treatment or other  patient management decisions.  A negative result may occur with  improper specimen  collection / handling, submission of specimen other  than nasopharyngeal swab, presence of viral mutation(s) within the  areas targeted by this assay, and inadequate number of viral copies  (<250 copies / mL). A negative result must be combined with clinical  observations, patient history, and epidemiological information. If result is POSITIVE SARS-CoV-2 target nucleic acids are DETECTED. The SARS-CoV-2 RNA is generally detectable in upper and lower  respiratory specimens dur ing the acute phase  of infection.  Positive  results are indicative of active infection with SARS-CoV-2.  Clinical  correlation with patient history and other diagnostic information is  necessary to determine patient infection status.  Positive results do  not rule out bacterial infection or co-infection with other viruses. If result is PRESUMPTIVE POSTIVE SARS-CoV-2 nucleic acids MAY BE PRESENT.   A presumptive positive result was obtained on the submitted specimen  and confirmed on repeat testing.  While 2019 novel coronavirus  (SARS-CoV-2) nucleic acids may be present in the submitted sample  additional confirmatory testing may be necessary for epidemiological  and / or clinical management purposes  to differentiate between  SARS-CoV-2 and other Sarbecovirus currently known to infect humans.  If clinically indicated additional testing with an alternate test  methodology [phone]) is advised. The SARS-CoV-2 RNA is generally  detectable in upper and lower respiratory sp ecimens during the acute  phase of infection. The expected result is Negative. Fact Sheet for Patients:  StrictlyIdeas.no Fact Sheet for Healthcare Providers: BankingDealers.co.za This test is not yet approved or cleared by the [place] FDA and has been authorized for detection and/or diagnosis of SARS-CoV-2 by FDA under an Emergency Use Authorization (EUA).  This EUA will remain in effect  (meaning this test can be used) for the duration of the COVID-19 declaration under Section 564(b)(1) of the Act, 21 U.S.C. section 360bbb-3(b)(1), unless the authorization is terminated or revoked sooner. Performed at [facility], [address] [place], [place] [address]   Blood Culture (routine x 2)     Status: None (Preliminary result)   Collection Time: 03/04/19  2:55 PM   Specimen: BLOOD  Result Value Ref Range Status   Specimen Description BLOOD BLOOD RIGHT HAND  Final   Special Requests   Final    BOTTLES DRAWN AEROBIC AND ANAEROBIC Blood Culture adequate volume   Culture   Final    NO GROWTH < 24 HOURS Performed at [facility], [address] [place], [place] [address]    Report Status PENDING  Incomplete  MRSA PCR Screening     Status: None   Collection Time: 03/04/19  8:36 PM   Specimen: Nasopharyngeal  Result Value Ref Range Status   MRSA by PCR NEGATIVE NEGATIVE Final    Comment:        The GeneXpert MRSA Assay (FDA approved for NASAL specimens only), is one component of a comprehensive MRSA colonization surveillance program. It is not intended to diagnose MRSA infection nor to guide or monitor treatment for MRSA infections. Performed at [facility], [address] [place], [place] [address]   Blood Culture (routine x 2)     Status: None (Preliminary result)   Collection Time: 03/04/19 10:01 PM   Specimen: BLOOD  Result Value Ref Range Status   Specimen Description BLOOD RIGHT ASSIST CONTROL  Final   Special Requests   Final    BOTTLES DRAWN AEROBIC AND ANAEROBIC Blood Culture adequate volume   Culture   Final    NO GROWTH < 12 HOURS Performed at [facility], [address] [place], [place] [address]    Report Status PENDING  Incomplete    Coagulation Studies: No results for input(s): LABPROT, INR in the last 72 hours.  Urinalysis: Recent Labs    03/04/19 1414  COLORURINE AMBER*  LABSPEC 1.016   PHURINE 5.0  GLUCOSEU >=500*  HGBUR MODERATE*  BILIRUBINUR NEGATIVE  KETONESUR NEGATIVE  PROTEINUR NEGATIVE  NITRITE NEGATIVE  LEUKOCYTESUR NEGATIVE  Imaging: Ct Head Wo Contrast  Result Date: 03/04/2019 CLINICAL DATA:  Altered level of consciousness. EXAM: CT HEAD WITHOUT CONTRAST TECHNIQUE: Contiguous axial images were obtained from the base of the skull through the vertex without intravenous contrast. COMPARISON:  09/07/2016 FINDINGS: Brain: As seen previously, the brainstem and cerebellum are normal. There is ventricular prominence without evidence old or acute focal infarction of the cerebral hemispheres. No mass lesion, hemorrhage or extra-axial collection. Ventricular size is unchanged since 2018 and likely secondary to central atrophy. As noted previously, normal pressure hydrocephalus is not excluded. Vascular: No abnormal vascular finding. Skull: Negative Sinuses/Orbits: Clear/normal Other: None IMPRESSION: Chronic ventriculomegaly without evidence of focal brain insult. No visible change since February 2018. This is felt most likely secondary to central atrophy. Normal pressure hydrocephalus is not excluded but not favored. Electronically Signed   By: Nelson Chimes M.D.   On: 03/04/2019 16:16   Dg Chest Port 1 View  Result Date: 03/05/2019 CLINICAL DATA:  Shortness of breath. EXAM: PORTABLE CHEST 1 VIEW COMPARISON:  03/04/2019. FINDINGS: Lower chest incompletely imaged. Cardiac pacer stable position. Stable cardiomegaly. Right lung base atelectasis/infiltrate and prominent right pleural effusion again noted. Small left pleural effusion noted. No pneumothorax. No acute bony abnormality. IMPRESSION: 1.  Cardiac pacer stable position.  Stable cardiomegaly. 2. Right lung base atelectasis/infiltrate and prominent right pleural effusion again noted. Small left pleural effusion noted. Chest is unchanged from prior exam. Electronically Signed   By: Marcello Moores  Register   On: 03/05/2019 05:39    Dg Chest Port 1 View  Result Date: 03/04/2019 CLINICAL DATA:  Shortness of breath. Respiratory distress. EXAM: PORTABLE CHEST 1 VIEW COMPARISON:  12/10/2017 FINDINGS: There is chronic cardiomegaly. AICD in place. There is a moderate right pleural effusion. Pulmonary vascularity is within normal limits. Suggestion of a small right left effusion but this is not definitive. No acute bone abnormality. IMPRESSION: 1. Moderate right pleural effusion. Possible small left effusion. 2. Chronic cardiomegaly. Electronically Signed   By: Lorriane Shire M.D.   On: 03/04/2019 14:42      Assessment & Plan: [name] is a 68 y.o. white male with diabetes mellitus type II, hypertension, obstructive sleep apnea, congestive heart failure, atrial fibrillation, hyperlipidemia, Crohn's disease, coronary artery disease, GERD, pacemaker, morbid obesity,  , who was admitted to [facility] on 03/04/2019 for SOB (shortness of breath) [R06.02] Pleural effusion on right [Y22] Chronic diastolic congestive heart failure ([place]) [I50.32] Sepsis with acute hypoxic respiratory failure, due to unspecified organism, unspecified whether septic shock present ([place]) [A41.9, R65.20, J96.01]  1. Acute renal failure: baseline creatinine of 1.1, normal GFR on 01/02/2019.  Most likely with underlying diabetic nephropathy with glucosuria.  - Anuric overnight.  - Requiring low dose vasopressors: norepinephrine.  - Recommend hemodialysis access placement. Most likely will need renal replacement therapy if no improvement by this afternoon.   2. Acute respiratory failure: requiring noninvasive mechanical ventilation.  Right pleural effusion - Plan on thoracentesis later today.   3. Acute exacerbation of systolic congestive heart failure: status post furosemide 48m IV x 1  4. Sepsis with hypotension: cultures pending. Requiring vasopressors. - Empiric cefepime. Also has received metronidazole and vancomycin.   LOS: 1 Viha Kriegel 8/4/20208:58 AM

## 2019-03-05 NOTE — Procedures (Signed)
PROCEDURE SUMMARY:  Successful image-guided right thoracentesis. Yielded 200 milliliters of hazy yellow fluid. Patient tolerated procedure well. EBL: Zero No immediate complications.  Specimen was sent for labs. Post procedure CXR shows no pneumothorax.  Please see imaging section of Epic for full dictation.  Joaquim Nam PA-C 03/05/2019 10:58 AM

## 2019-03-05 NOTE — Progress Notes (Signed)
[facility] [place] NAME: Colton Lamb    MR#:  [id number]  DATE OF BIRTH:  08-02-1950  SUBJECTIVE:   Patient states that his shortness of breath is a little bit better today.  He remains on BiPAP this morning.  No concerns.  REVIEW OF SYSTEMS:  Review of Systems  Constitutional: Negative for chills and fever.  HENT: Negative for congestion and sore throat.   Eyes: Negative for blurred vision and double vision.  Respiratory: Positive for shortness of breath. Negative for cough.   Cardiovascular: Negative for chest pain and palpitations.  Gastrointestinal: Negative for nausea and vomiting.  Genitourinary: Negative for dysuria and urgency.  Musculoskeletal: Negative for back pain and neck pain.  Neurological: Negative for dizziness and headaches.  Psychiatric/Behavioral: Negative for depression. The patient is not nervous/anxious.     DRUG ALLERGIES:   Allergies  Allergen Reactions   Iodine Other (See Comments)    Shortness of breath, swelling and hives   Shrimp [Shellfish Allergy] Other (See Comments)    SWELLING    HIVES    SHORTNESS OF BREATH   Tetracycline Rash   VITALS:  Blood pressure (!) 83/53, pulse 81, temperature 97.9 F (36.6 C), temperature source Axillary, resp. rate (!) 36, height 6' (1.829 m), weight 127 kg, SpO2 95 %. PHYSICAL EXAMINATION:  Physical Exam  GENERAL:  Laying in the bed with no acute distress.  HEENT: Head atraumatic, normocephalic. Pupils equal, round, reactive to light and accommodation. No scleral icterus. Extraocular muscles intact. Oropharynx and nasopharynx clear.  NECK:  Supple, no jugular venous distention. No thyroid enlargement. LUNGS: + Diminished breath sounds in the right lower to mid lung. No wheezes, crackles, rhonchi. No use of accessory muscles of respiration. + BiPAP in place CARDIOVASCULAR: Irregularly irregular rhythm, regular rate, S1, S2 normal. No murmurs, rubs, or gallops.   ABDOMEN: Soft, nontender, nondistended. Bowel sounds present.  EXTREMITIES: No pedal edema, cyanosis, or clubbing.  NEUROLOGIC: CN 2-12 intact, no focal deficits. 5/5 muscle strength throughout all extremities. Sensation intact throughout. Gait not checked.  PSYCHIATRIC: The patient is alert and oriented x 3.  SKIN: No obvious rash, lesion, or ulcer.  LABORATORY PANEL:  Male CBC Recent Labs  Lab 03/05/19 0526  WBC 39.0*  HGB 11.8*  HCT 35.9*  PLT 364   ------------------------------------------------------------------------------------------------------------------ Chemistries  Recent Labs  Lab 03/05/19 0526  NA 131*  K 4.4  CL 96*  CO2 17*  GLUCOSE 224*  BUN 91*  CREATININE 4.82*  CALCIUM 8.0*   RADIOLOGY:  Ct Head Wo Contrast  Result Date: 03/04/2019 CLINICAL DATA:  Altered level of consciousness. EXAM: CT HEAD WITHOUT CONTRAST TECHNIQUE: Contiguous axial images were obtained from the base of the skull through the vertex without intravenous contrast. COMPARISON:  09/07/2016 FINDINGS: Brain: As seen previously, the brainstem and cerebellum are normal. There is ventricular prominence without evidence old or acute focal infarction of the cerebral hemispheres. No mass lesion, hemorrhage or extra-axial collection. Ventricular size is unchanged since 2018 and likely secondary to central atrophy. As noted previously, normal pressure hydrocephalus is not excluded. Vascular: No abnormal vascular finding. Skull: Negative Sinuses/Orbits: Clear/normal Other: None IMPRESSION: Chronic ventriculomegaly without evidence of focal brain insult. No visible change since February 2018. This is felt most likely secondary to central atrophy. Normal pressure hydrocephalus is not excluded but not favored. Electronically Signed   By: Nelson Chimes M.D.   On: 03/04/2019 16:16   US Renal  Result Date:  03/05/2019 CLINICAL DATA:  68 year old male with acute renal failure. EXAM: RENAL / URINARY TRACT  ULTRASOUND COMPLETE COMPARISON:  Report of CT Abdomen and Pelvis 12/03/2002 (no images available). FINDINGS: Right Kidney: Renal measurements: 12.0 x 5.9 x 5.7 centimeters = volume: 210 mL . Echogenicity within normal limits. No mass or hydronephrosis visualized. Left Kidney: Renal measurements: 11.5 x 6.6 x 5.9 centimeters = volume: 233 mL. Echogenicity within normal limits. No mass or hydronephrosis visualized. Bladder: Faintly visible Foley catheter balloon, the urinary bladder appears decompressed (image 29). Other findings: Shadowing echogenic gallstones (image 16). IMPRESSION: 1. No acute renal findings.  Bladder decompressed by Foley catheter. 2. Cholelithiasis. Electronically Signed   By: Genevie Ann M.D.   On: 03/05/2019 11:49   Dg Chest Port 1 View  Result Date: 03/05/2019 CLINICAL DATA:  Pleural effusion. EXAM: PORTABLE CHEST 1 VIEW COMPARISON:  Same day chest ultrasound 03/05/2019, same-day chest radiograph 03/05/2019. FINDINGS: Lower chest incompletely imaged. Redemonstrated left chest AICD device. Cardiomegaly. Persistent gradient opacity at the right lung base consistent with residual pleural effusion. Additional right basilar opacity compatible with atelectasis and/or pneumonia. No focal consolidation within the left lung. No evidence of pneumothorax. IMPRESSION: Atelectasis and/or pneumonia at the right lung base with residual right pleural effusion. Cardiomegaly. Electronically Signed   By: [name]   On: 03/05/2019 11:39   Dg Chest Port 1 View  Result Date: 03/05/2019 CLINICAL DATA:  Shortness of breath. EXAM: PORTABLE CHEST 1 VIEW COMPARISON:  03/04/2019. FINDINGS: Lower chest incompletely imaged. Cardiac pacer stable position. Stable cardiomegaly. Right lung base atelectasis/infiltrate and prominent right pleural effusion again noted. Small left pleural effusion noted. No pneumothorax. No acute bony abnormality. IMPRESSION: 1.  Cardiac pacer stable position.  Stable cardiomegaly. 2. Right  lung base atelectasis/infiltrate and prominent right pleural effusion again noted. Small left pleural effusion noted. Chest is unchanged from prior exam. Electronically Signed   By: Marcello Moores  Register   On: 03/05/2019 05:39   Dg Chest Port 1 View  Result Date: 03/04/2019 CLINICAL DATA:  Shortness of breath. Respiratory distress. EXAM: PORTABLE CHEST 1 VIEW COMPARISON:  12/10/2017 FINDINGS: There is chronic cardiomegaly. AICD in place. There is a moderate right pleural effusion. Pulmonary vascularity is within normal limits. Suggestion of a small right left effusion but this is not definitive. No acute bone abnormality. IMPRESSION: 1. Moderate right pleural effusion. Possible small left effusion. 2. Chronic cardiomegaly. Electronically Signed   By: Lorriane Shire M.D.   On: 03/04/2019 14:42   US Thoracentesis Asp Pleural Space W/img Guide  Result Date: 03/05/2019 INDICATION: Patient with history of CHF, a. flutter s/p cardioversion who presented to [facility] ED on 03/04/19 with worsening dyspnea. Imaging shows left lower lobe pneumonia and right sided pleural effusion. Request has been made to IR for diagnostic thoracentesis at bedside due to patient being on BiPAP currently. EXAM: ULTRASOUND GUIDED RIGHT THORACENTESIS MEDICATIONS: 10 mL 1% lidocaine COMPLICATIONS: None immediate. PROCEDURE: An ultrasound guided thoracentesis was thoroughly discussed with the patient and questions answered. The benefits, risks, alternatives and complications were also discussed. The patient understands and wishes to proceed with the procedure. Written consent was obtained. Ultrasound was performed to localize and mark an adequate pocket of fluid in the right chest. The area was then prepped and draped in the normal sterile fashion. 1% Lidocaine was used for local anesthesia. Under ultrasound guidance a 6 Fr Safe-T-Centesis catheter was introduced. Thoracentesis was performed. The catheter was removed and a dressing applied. FINDINGS: A  total of approximately  200 mL of hazy yellow fluid was removed. Samples were sent to the laboratory as requested by the clinical team. IMPRESSION: Successful ultrasound guided right thoracentesis yielding 200 mL of pleural fluid. Read by Candiss Norse, PA-C Electronically Signed   By: Markus Daft M.D.   On: 03/05/2019 11:20   ASSESSMENT AND PLAN:   Septic shock due to CAP and right pleural effusion -Continue cefepime -F/u cultures -Continue pressors  Acute hypoxic respiratory failure secondary to acute on chronic systolic CHF, CAP, and right pleural effusion -Unable to diurese due to hypotension -Nephrology planning to start CRRT -Plan for right thoracentesis today  Acute renal failure  -Nephrology following- plan to start CRRT today  Chronic atrial fibrillation on Xarelto- rate controlled -Holding Xarelto for thoracentesis  Type 2 diabetes -Continue SSI   All the records are reviewed and case discussed with Care Management/Social Worker. Management plans discussed with the patient, family and they are in agreement.  CODE STATUS: Full Code  TOTAL TIME TAKING CARE OF THIS PATIENT: 40 minutes.   More than 50% of the time was spent in counseling/coordination of care: YES  POSSIBLE D/C unknown, DEPENDING ON CLINICAL CONDITION.   Berna Spare Tilla Wilborn M.D on 03/05/2019 at 1:24 PM  Between 7am to 6pm - Pager - [phone]  After 6pm go to [email or web address] - Proofreader  [facility]  Office  [phone]  CC: Primary care physician; Toni Arthurs, NP  Note: This dictation was prepared with Dragon dictation along with smaller phrase technology. Any transcriptional errors that result from this process are unintentional.

## 2019-03-05 NOTE — Progress Notes (Addendum)
Shift summary:  - AA+OX4 on AM assessment. NAD. - Plan to remain on BiPAP until after thoracentesis today.  - RIJ Trialysis cath placed by Dr. Alva Garnet.  - H/D later today.

## 2019-03-05 NOTE — Progress Notes (Signed)
Ch visited w/ pt who requested prayer. Pt was on a BiPAP but was alert and responsive. Pt presented to be moderately anxious. Ch sat bedside with pt and prayed for his health to be restored, the wisdom of the care team to usher him back to health, and for his anxiety to melt away. Ch provided words of encouragement to pt.  F/U recommended.    03/05/19 1100  Clinical Encounter Type  Visited With Patient  Visit Type Spiritual support  Referral From Physician  Consult/Referral To Chaplain  Spiritual Encounters  Spiritual Needs Emotional;Grief support;Prayer  Stress Factors  Patient Stress Factors Exhausted;Health changes  Family Stress Factors None identified

## 2019-03-05 NOTE — Progress Notes (Signed)
Patient on 2L [place], tolerating well. SAT 94%. No distress noted. Patient would like to stay off Bipap for night if possible. Will continue to monitor. Bipap on standby in room.

## 2019-03-05 NOTE — Plan of Care (Signed)
  Problem: Education: Goal: Knowledge of General Education information will improve Description: Including pain rating scale, medication(s)/side effects and non-pharmacologic comfort measures Outcome: Progressing   Problem: Health Behavior/Discharge Planning: Goal: Ability to manage health-related needs will improve Outcome: Progressing   Problem: Activity: Goal: Risk for activity intolerance will decrease Outcome: Progressing   Problem: Nutrition: Goal: Adequate nutrition will be maintained Outcome: Progressing   Problem: Coping: Goal: Level of anxiety will decrease Outcome: Progressing   Problem: Elimination: Goal: Will not experience complications related to bowel motility Outcome: Progressing Goal: Will not experience complications related to urinary retention Outcome: Progressing   Problem: Pain Managment: Goal: General experience of comfort will improve Outcome: Progressing   Problem: Safety: Goal: Ability to remain free from injury will improve Outcome: Progressing   Problem: Skin Integrity: Goal: Risk for impaired skin integrity will decrease Outcome: Progressing

## 2019-03-05 NOTE — Progress Notes (Signed)
CRITICAL VALUE ALERT  Critical Value:  Lactic=2.3  Date & Time Notied:  03/04/19 @ 2145  Provider Notified: Marily Memos  Orders Received/Actions taken:

## 2019-03-05 NOTE — Progress Notes (Signed)
Pt was taken off bipap and placed on 2l nasal cannula.

## 2019-03-05 NOTE — Procedures (Signed)
PROCEDURE NOTE: R IJ TRIALYSIS CATHETER PLACEMENT  INDICATION:    Need for HD. AKI/CKD  CONSENT:   Risks of procedure as well as the alternatives were explained to the patient or surrogate. Consent for procedure obtained. A time out was performed.   PROCEDURE  Sterile technique was used including antiseptics, cap, gloves, gown, hand hygiene, mask and full body sheet.  Skin prep: Chlorhexidine; local anesthetic administered  A triple lumen HD catheter was placed in the R IJ vein using the Seldinger technique.  Ultrasound was used for vessel identification and guidance.   EVALUATION:  Blood flow good  Complications: No apparent complications  Patient tolerated the procedure well.  Chest X-ray ordered to verify placement and is pending   Merton Border, MD PCCM service Mobile [phone] 03/05/2019 2:30 PM

## 2019-03-06 ENCOUNTER — Inpatient Hospital Stay: Payer: Medicare Other

## 2019-03-06 DIAGNOSIS — J869 Pyothorax without fistula: Secondary | ICD-10-CM

## 2019-03-06 DIAGNOSIS — J90 Pleural effusion, not elsewhere classified: Secondary | ICD-10-CM

## 2019-03-06 DIAGNOSIS — Q639 Congenital malformation of kidney, unspecified: Secondary | ICD-10-CM

## 2019-03-06 DIAGNOSIS — Q8789 Other specified congenital malformation syndromes, not elsewhere classified: Secondary | ICD-10-CM

## 2019-03-06 DIAGNOSIS — I424 Endocardial fibroelastosis: Secondary | ICD-10-CM

## 2019-03-06 LAB — RENAL FUNCTION PANEL
Albumin: 2.3 g/dL — ABNORMAL LOW (ref 3.5–5.0)
Anion gap: 16 — ABNORMAL HIGH (ref 5–15)
BUN: 97 mg/dL — ABNORMAL HIGH (ref 8–23)
CO2: 19 mmol/L — ABNORMAL LOW (ref 22–32)
Calcium: 7.9 mg/dL — ABNORMAL LOW (ref 8.9–10.3)
Chloride: 100 mmol/L (ref 98–111)
Creatinine, Ser: 5.19 mg/dL — ABNORMAL HIGH (ref 0.61–1.24)
GFR calc Af Amer: 12 mL/min — ABNORMAL LOW (ref >60)
GFR calc non Af Amer: 11 mL/min — ABNORMAL LOW (ref >60)
Glucose, Bld: 197 mg/dL — ABNORMAL HIGH (ref 70–99)
Phosphorus: 8.3 mg/dL — ABNORMAL HIGH (ref 2.5–4.6)
Potassium: 4.7 mmol/L (ref 3.5–5.1)
Sodium: 135 mmol/L (ref 135–145)

## 2019-03-06 LAB — CBC
HCT: 37 % — ABNORMAL LOW (ref 39.0–52.0)
Hemoglobin: 12.1 g/dL — ABNORMAL LOW (ref 13.0–17.0)
MCH: 27.4 pg (ref 26.0–34.0)
MCHC: 32.7 g/dL (ref 30.0–36.0)
MCV: 83.7 fL (ref 80.0–100.0)
Platelets: 443 10*3/uL — ABNORMAL HIGH (ref 150–400)
RBC: 4.42 MIL/uL (ref 4.22–5.81)
RDW: 15.8 % — ABNORMAL HIGH (ref 11.5–15.5)
WBC: 40 10*3/uL — ABNORMAL HIGH (ref 4.0–10.5)
nRBC: 0 % (ref 0.0–0.2)

## 2019-03-06 LAB — PROTEIN, BODY FLUID (OTHER): Total Protein, Body Fluid Other: 1.4 g/dL

## 2019-03-06 LAB — GLUCOSE, CAPILLARY
Glucose-Capillary: 174 mg/dL — ABNORMAL HIGH (ref 70–99)
Glucose-Capillary: 189 mg/dL — ABNORMAL HIGH (ref 70–99)
Glucose-Capillary: 203 mg/dL — ABNORMAL HIGH (ref 70–99)
Glucose-Capillary: 214 mg/dL — ABNORMAL HIGH (ref 70–99)
Glucose-Capillary: 234 mg/dL — ABNORMAL HIGH (ref 70–99)
Glucose-Capillary: 240 mg/dL — ABNORMAL HIGH (ref 70–99)
Glucose-Capillary: 259 mg/dL — ABNORMAL HIGH (ref 70–99)

## 2019-03-06 LAB — HEPATITIS B SURFACE ANTIGEN: Hepatitis B Surface Ag: NEGATIVE

## 2019-03-06 LAB — PH, BODY FLUID: pH, Body Fluid: 7.1

## 2019-03-06 LAB — URINE CULTURE: Culture: <10000 — AB

## 2019-03-06 LAB — PROCALCITONIN: Procalcitonin: 27.22 ng/mL

## 2019-03-06 LAB — CYTOLOGY - NON PAP

## 2019-03-06 LAB — PARATHYROID HORMONE, INTACT (NO CA): PTH: 190 pg/mL — ABNORMAL HIGH (ref 15–65)

## 2019-03-06 LAB — HEPATITIS B CORE ANTIBODY, TOTAL: Hep B Core Total Ab: NEGATIVE

## 2019-03-06 LAB — HEPATITIS B SURFACE ANTIBODY,QUALITATIVE: Hep B S Ab: NONREACTIVE

## 2019-03-06 MED ORDER — VANCOMYCIN HCL IN DEXTROSE 1-5 GM/200ML-% IV SOLN
1000.0000 mg | Freq: Once | INTRAVENOUS | Status: AC
  Administered 2019-03-06: 1000 mg via INTRAVENOUS
  Filled 2019-03-06: qty 200

## 2019-03-06 MED ORDER — FENTANYL CITRATE (PF) 100 MCG/2ML IJ SOLN
INTRAMUSCULAR | Status: AC
  Filled 2019-03-06: qty 2

## 2019-03-06 MED ORDER — MIDAZOLAM HCL 2 MG/2ML IJ SOLN
INTRAMUSCULAR | Status: AC
  Filled 2019-03-06: qty 2

## 2019-03-06 MED ORDER — VANCOMYCIN HCL 10 G IV SOLR
2000.0000 mg | Freq: Once | INTRAVENOUS | Status: AC
  Administered 2019-03-06: 2000 mg via INTRAVENOUS
  Filled 2019-03-06: qty 2000

## 2019-03-06 MED ORDER — SODIUM CHLORIDE 0.9 % IV SOLN
1.0000 g | INTRAVENOUS | Status: DC
  Administered 2019-03-06: 1 g via INTRAVENOUS
  Filled 2019-03-06 (×2): qty 1

## 2019-03-06 MED ORDER — BUDESONIDE 0.25 MG/2ML IN SUSP
0.2500 mg | Freq: Two times a day (BID) | RESPIRATORY_TRACT | Status: DC
  Administered 2019-03-06 – 2019-03-17 (×23): 0.25 mg via RESPIRATORY_TRACT
  Filled 2019-03-06 (×23): qty 2

## 2019-03-06 MED ORDER — METHYLPREDNISOLONE SODIUM SUCC 40 MG IJ SOLR: 40.0000 mg | Freq: Two times a day (BID) | INTRAMUSCULAR | Status: DC

## 2019-03-06 MED ORDER — IPRATROPIUM-ALBUTEROL 0.5-2.5 (3) MG/3ML IN SOLN
3.0000 mL | RESPIRATORY_TRACT | Status: DC
  Administered 2019-03-06 – 2019-03-12 (×39): 3 mL via RESPIRATORY_TRACT
  Filled 2019-03-06 (×39): qty 3

## 2019-03-06 MED ORDER — METHYLPREDNISOLONE SODIUM SUCC 40 MG IJ SOLR
40.0000 mg | Freq: Two times a day (BID) | INTRAMUSCULAR | Status: DC
  Administered 2019-03-06 (×2): 40 mg via INTRAVENOUS
  Filled 2019-03-06 (×2): qty 1

## 2019-03-06 NOTE — Progress Notes (Signed)
[name] NAME: Colton Lamb    MR#:  [id number]  DATE OF BIRTH:  05-20-1951  SUBJECTIVE:   Patient is off the BiPAP.  Says breathing is better.  REVIEW OF SYSTEMS:  Review of Systems  Constitutional: Negative for chills and fever.  HENT: Negative for congestion and sore throat.   Eyes: Negative for blurred vision and double vision.  Respiratory: Positive for shortness of breath. Negative for cough.   Cardiovascular: Negative for chest pain and palpitations.  Gastrointestinal: Negative for nausea and vomiting.  Genitourinary: Negative for dysuria and urgency.  Musculoskeletal: Negative for back pain and neck pain.  Neurological: Negative for dizziness and headaches.  Psychiatric/Behavioral: Negative for depression. The patient is not nervous/anxious.     DRUG ALLERGIES:   Allergies  Allergen Reactions  . Iodine Other (See Comments)    Shortness of breath, swelling and hives  . Shrimp [Shellfish Allergy] Other (See Comments)    SWELLING    HIVES    SHORTNESS OF BREATH  . Tetracycline Rash   VITALS:  Blood pressure (!) 105/58, pulse 77, temperature 97.6 F (36.4 C), temperature source Axillary, resp. rate (!) 28, height 6' (1.829 m), weight 132.1 kg, SpO2 92 %. PHYSICAL EXAMINATION:  Physical Exam  GENERAL:  Laying in the bed with no acute distress.  HEENT: Head atraumatic, normocephalic. Pupils equal, round, reactive to light and accommodation. No scleral icterus. Extraocular muscles intact. Oropharynx and nasopharynx clear.  NECK:  Supple, no jugular venous distention. No thyroid enlargement. LUNGS: + Diminished breath sounds in the right lower to mid lung. No wheezes, crackles, rhonchi. CARDIOVASCULAR: Irregularly irregular rhythm, regular rate, S1, S2 normal. No murmurs, rubs, or gallops.  ABDOMEN: Soft, nontender, nondistended. Bowel sounds present.  EXTREMITIES: No pedal edema, cyanosis, or clubbing.   NEUROLOGIC: CN 2-12 intact, no focal deficits. 5/5 muscle strength throughout all extremities. Sensation intact throughout. Gait not checked.  PSYCHIATRIC: The patient is alert and oriented x 3.  SKIN: No obvious rash, lesion, or ulcer.  LABORATORY PANEL:  Male CBC Recent Labs  Lab 03/06/19 0437  WBC 40.0*  HGB 12.1*  HCT 37.0*  PLT 443*   ------------------------------------------------------------------------------------------------------------------ Chemistries  Recent Labs  Lab 03/06/19 0437  NA 135  K 4.7  CL 100  CO2 19*  GLUCOSE 197*  BUN 97*  CREATININE 5.19*  CALCIUM 7.9*   RADIOLOGY:  US Renal  Result Date: 03/05/2019 CLINICAL DATA:  68 year old male with acute renal failure. EXAM: RENAL / URINARY TRACT ULTRASOUND COMPLETE COMPARISON:  Report of CT Abdomen and Pelvis 12/03/2002 (no images available). FINDINGS: Right Kidney: Renal measurements: 12.0 x 5.9 x 5.7 centimeters = volume: 210 mL . Echogenicity within normal limits. No mass or hydronephrosis visualized. Left Kidney: Renal measurements: 11.5 x 6.6 x 5.9 centimeters = volume: 233 mL. Echogenicity within normal limits. No mass or hydronephrosis visualized. Bladder: Faintly visible Foley catheter balloon, the urinary bladder appears decompressed (image 29). Other findings: Shadowing echogenic gallstones (image 16). IMPRESSION: 1. No acute renal findings.  Bladder decompressed by Foley catheter. 2. Cholelithiasis. Electronically Signed   By: Genevie Ann M.D.   On: 03/05/2019 11:49   Dg Chest Port 1 View  Result Date: 03/05/2019 CLINICAL DATA:  Central line placement. EXAM: PORTABLE CHEST 1 VIEW 2:37 p.m. COMPARISON:  03/05/2019 at 11:12 a.m. FINDINGS: Double lumen central venous catheter is been inserted. The tip is at the cavoatrial junction in good position. No pneumothorax. Moderate to large  right pleural effusion, unchanged. Possible infiltrate or atelectasis at the right lung base. Cardiomegaly. Slight pulmonary  vascular prominence. No discrete infiltrates or effusions on the left. No acute bone abnormality. AICD in place. IMPRESSION: 1. Central venous catheter in good position. No pneumothorax. 2. Persistent moderate to large right pleural effusion. Possible atelectasis or infiltrate at the right base. Electronically Signed   By: Lorriane Shire M.D.   On: 03/05/2019 15:04   Dg Chest Port 1 View  Result Date: 03/05/2019 CLINICAL DATA:  Pleural effusion. EXAM: PORTABLE CHEST 1 VIEW COMPARISON:  Same day chest ultrasound 03/05/2019, same-day chest radiograph 03/05/2019. FINDINGS: Lower chest incompletely imaged. Redemonstrated left chest AICD device. Cardiomegaly. Persistent gradient opacity at the right lung base consistent with residual pleural effusion. Additional right basilar opacity compatible with atelectasis and/or pneumonia. No focal consolidation within the left lung. No evidence of pneumothorax. IMPRESSION: Atelectasis and/or pneumonia at the right lung base with residual right pleural effusion. Cardiomegaly. Electronically Signed   By: [name]   On: 03/05/2019 11:39   ASSESSMENT AND PLAN:   Septic shock due to CAP and right pleural effusion -Continue cefepime, patient may have empyema, CT chest ordered, CT surgery consulted. -F/u cultures -Continue pressors, continue IV steroids, bronchodilators.  Acute hypoxic respiratory failure secondary to acute on chronic systolic CHF, CAP, and right pleural effusion -Status post thoracentesis, 200 mL of yellow cloudy fluid suggestive extra exudate, CT surgery consulted for possible chest tube placement, CT chest without contrast ordered.  Acute renal failure, worsening renal function -Nephrology following-for dialysis needs.  Chronic atrial fibrillation on Xarelto- rate controlled -Holding Xarelto for possible further procedures like chest tube if needed.  On heparin at this time for DVT prophylaxis.  Type 2 diabetes, -Continue SSI   All the  records are reviewed and case discussed with Care Management/Social Worker. Management plans discussed with the patient, family and they are in agreement.  CODE STATUS: Full Code  TOTAL TIME TAKING CARE OF THIS PATIENT: 40 minutes.   More than 50% of the time was spent in counseling/coordination of care: YES  POSSIBLE D/C unknown, DEPENDING ON CLINICAL CONDITION.   Epifanio Lesches M.D on 03/06/2019 at 11:22 AM  Between 7am to 6pm - Pager - [phone]  After 6pm go to [email or web address] - Proofreader  [facility]  Office  [phone]  CC: Primary care physician; Toni Arthurs, NP  Note: This dictation was prepared with Dragon dictation along with smaller phrase technology. Any transcriptional errors that result from this process are unintentional.

## 2019-03-06 NOTE — Progress Notes (Signed)
Pre HD Tx:    03/06/19 1545  Vital Signs  Pulse Rate 78  Resp (!) 32  BP 95/69  Oxygen Therapy  O2 Device Nasal Cannula  93%   O2 Flow Rate (L/min) 2 L/min  Patient Activity (if Appropriate) In bed  Pulse Oximetry Type Continuous  Pain Assessment  Pain Scale 0-10  Pain Score 0  Dialysis Weight  Weight 129.9 kg  Type of Weight Pre-Dialysis  Time-Out for Hemodialysis  What Procedure? HD   Pt Identifiers(min of two) First/Last Name;MRN/Account#;Pt's DOB(use if MRN/Acct# not available  Correct Site? Yes  Correct Side? Yes  Correct Procedure? Yes  Consents Verified? Yes  Rad Studies Available? N/A  Safety Precautions Reviewed? Yes  Engineer, civil (consulting) Number 2  Station Number  (Bedside)  UF/Alarm Test Passed  Conductivity: Meter 13.8  Conductivity: Machine  14.1  pH 7.6  Reverse Osmosis protable  Normal Saline Lot Number [id number] (secondary: [id number])  Dialyzer Lot Number [id number]  Disposable Set Lot Number [id number]  Machine Temperature 98.6 F (37 C)  Musician and Audible Yes  Blood Lines Intact and Secured Yes  Pre Treatment Patient Checks  Vascular access used during treatment Catheter (IJ)  HD catheter dressing before treatment WDL  Patient is receiving dialysis in a chair  (ICU bed)  Hepatitis B Surface Antigen Results Negative  Date Hepatitis B Surface Antigen Drawn 03/05/19  Isolation Initiated  (none)  Hepatitis B Surface Antibody  (<1)  Date Hepatitis B Surface Antibody Drawn 03/05/19  Hemodialysis Consent Verified Yes  Hemodialysis Standing Orders Initiated Yes  ECG (Telemetry) Monitor On Yes  Prime Ordered Normal Saline  Length of  DialysisTreatment -hour(s) 2.5 Hour(s)  Dialysis Treatment Comments  (Na 140)  Dialyzer Elisio 17H NR  Dialysate 3K;2.5 Ca  Dialysate Flow Ordered 500  Blood Flow Rate Ordered 250 mL/min  Ultrafiltration Goal 0.7 Liters  Dialysis Blood Pressure Support Ordered Normal Saline  Education / Care Plan   Dialysis Education Provided Yes  Documented Education in Care Plan Yes  Outpatient Plan of Care Reviewed and on Chart Yes  Hemodialysis Catheter Right Internal jugular Triple lumen Temporary (Non-Tunneled)  Placement Date/Time: 03/05/19 1450   Placed prior to admission: No  Time Out: Correct procedure;Correct site;Correct patient  Maximum sterile barrier precautions: Large sterile sheet;Sterile gown;Cap;Hand hygiene;Sterile gloves;Mask  Site Prep: Chlorh...  Site Condition No complications  Blue Lumen Status Blood return noted  Red Lumen Status Blood return noted  Purple Lumen Status N/A  Dressing Type Biopatch;Occlusive  Dressing Status Clean;Dry;Intact  Drainage Description None  Dressing Change Due 03/12/19

## 2019-03-06 NOTE — Procedures (Addendum)
I  Interventional Radiology Procedure:   Indications: Right lung empyema.  Procedure: CT guided right chest tube placement  Findings: Complex right pleural effusion with gas pockets.  14 Fr drain placed and greater than 300 ml of cloudy yellow foul-smelling fluid removed.   Complications: None     EBL: less than 30 ml  Plan: Return to ICU.   Plan for fibrinolytic therapy per pulmonary / CT surgery.     Ferry Matthis R. Anselm Pancoast, MD  Pager: [phone]

## 2019-03-06 NOTE — Progress Notes (Signed)
Name: Colton Lamb MRN: [id number] DOB: 05/13/1951    ADMISSION DATE:  03/04/2019  CHIEF COMPLAINT: Shortness of Breath  OVERNIGHT EVENTS: Pt refused Bipap overnight, remained on 2 L [place] without complication- pt not compliant with CPAP at home.  - UOP improved s/p 1 st HD treatment: 200 mL, renal function still trending up  (Cr. 5.19) - He reports feeling "much better" and that his breathing "was good all night", no compliants  SIGNIFICANT EVENTS / STUDIES:  8/3 > CT head: Chronic ventriculomegaly without evidence of focal brain insult. No visible change since February 2018. This is felt most likely secondary to central atrophy. Normal pressure hydrocephalus is not excluded but not favored. 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 Per ED documentation [facility] reported the pt as hypoxic and somnolent requiring Cpap for transport.  Upon arrival in ED pt was placed on Bipap with subsequent improving mental status. Pt denies fevers/chills, denies chest pain/palpitations, denies sputum production.  Significant diagnostics include: BNP- 972, Troponin- 56, Lactic- 2.7, serum bicarb- 17, AG- 18, BUN- 72, Cr.- 3.94, WBC-  31.2 & CXR revealing moderate right sided pleural effusion.  Pt received 2 L of IVF resuscitation and 20 mg of lasix. Pt admitted to ICU on Bipap, and was found to be hypotensive requiring pressor administration.  PAST MEDICAL HISTORY :   has a past medical history of Atrial flutter ([place]), Chronic systolic heart failure ([place]), Coronary artery disease, Crohn's ileocolitis ([place]), GERD (gastroesophageal reflux disease), adenomatous colonic polyps (11/2003), Hyperlipidemia, Hypertension, Ischemic cardiomyopathy, Obesity, Paroxysmal atrial fibrillation ([place]), Sleep apnea, Syncope, and Type II diabetes mellitus ([place]).  has a past surgical history that includes Ileocecal resection and sigmoid enterocolonic fistula repair (09/1998); Hydrocele surgery (Bilateral); Foot surgery (Left); Cardioversion (N/A, 03/05/2014); Cardiac defibrillator placement (04/16/2014); Cardiac electrophysiology study and ablation (04/16/2014); Coronary angioplasty with stent (2007; 2008 X 2); Cardiac catheterization (10/2013); left heart catheterization with coronary angiogram (N/A, 11/22/2013); Atrial flutter ablation (N/A, 04/16/2014); implantable cardioverter defibrillator implant (N/A, 04/16/2014); Cataract extraction w/PHACO (Right, 01/04/2017); and Cataract extraction w/PHACO (Left, 02/08/2017). Prior to Admission medications   Medication Sig Start Date End Date Taking? Authorizing Provider  acetaminophen (TYLENOL) 325 MG tablet Take 1-2 tablets (325-650 mg total) by mouth every 4 (four) hours as needed for mild pain. 04/17/14   [name], NP  amiodarone (PACERONE) 200 MG tablet Take 1 tablet (200 mg total) once daily M-F and 0.5 tablet (100 mg total) Saturday and Sunday. 01/10/19   Rise Mu, PA-C  aspirin 81 MG tablet Take 81 mg by mouth daily.     [provider]  atorvastatin (LIPITOR) 80 MG  tablet TAKE 1 TABLET BY MOUTH EVERY DAY 12/11/18   Minna Merritts, MD  balsalazide (COLAZAL) 750 MG capsule TAKE 1 CAPSULE (750 MG  TOTAL) BY MOUTH 3 (THREE) TIMES DAILY. 11/28/18   Ladene Artist, MD  Blood Glucose Monitoring Suppl ([place]) w/Device KIT See admin instructions. 01/08/19   [provider]  carvedilol (COREG) 12.5 MG tablet TAKE 1 TABLET (12.5 MG TOTAL) BY MOUTH 2 (TWO) TIMES DAILY. 02/11/19   Minna Merritts, MD  ENTRESTO 49-51 MG TAKE 1 TABLET BY MOUTH TWICE A DAY 05/16/18   Gollan, Kathlene November, MD  JARDIANCE 10 MG TABS tablet Take 10 mg by mouth daily. 01/04/19   [provider]  metFORMIN (GLUCOPHAGE) 1000 MG tablet Take 1,000 mg by mouth 2 (two) times daily with a meal.  06/05/15   [provider]  niacin (NIASPAN) 1000 MG CR tablet TAKE 1 TABLET BY MOUTH AT BEDTIME 07/20/18   Gollan, Kathlene November, MD  nitroGLYCERIN (NITROSTAT) 0.4 MG SL tablet Place 0.4 mg under the tongue every 5 (five) minutes as needed (MAX 3 TABLETS). FOR CHEST PAIN    [provider]  omeprazole (PRILOSEC) 20 MG capsule Take 20 mg by mouth daily.     [provider]  ONETOUCH VERIO test strip USE 1 EACH (1 STRIP TOTAL) 3 (THREE) TIMES DAILY USE AS INSTRUCTED. 01/09/19   [provider]  potassium chloride (K-DUR) 10 MEQ tablet TAKE 1 TABLET BY MOUTH EVERY DAY 04/23/18   Minna Merritts, MD  terbinafine (LAMISIL) 250 MG tablet Take 1 tablet (250 mg total) by mouth daily. 02/20/19   Hyatt, Max T, DPM  torsemide (DEMADEX) 20 MG tablet Take 2 tablets (40 mg total) by mouth as directed. Alternate 20 mg twice a day and 40 mg twice a day 04/13/18   Gollan, Kathlene November, MD  XARELTO 20 MG TABS tablet TAKE 1 TABLET BY MOUTH EVERY DAY WITH LUNCH 11/23/18   Minna Merritts, MD   Allergies  Allergen Reactions   Iodine Other (See Comments)    Shortness of breath, swelling and hives   Shrimp [Shellfish Allergy] Other (See Comments)    SWELLING    HIVES    SHORTNESS OF BREATH   Tetracycline Rash    REVIEW OF SYSTEMS:  Positives in BOLD Constitutional: Negative for fever, chills,  weight loss, malaise/fatigue and diaphoresis.  HENT: Negative for hearing loss, ear pain, nosebleeds, congestion, sore throat, neck pain, tinnitus and ear discharge.   Eyes: Negative for blurred vision, double vision, photophobia, pain, discharge and redness.  Respiratory: Negative for cough, hemoptysis, sputum production, shortness of breath, wheezing and stridor.   Cardiovascular: Negative for chest pain, palpitations, orthopnea, claudication, leg swelling and PND.  Gastrointestinal: Negative for heartburn, nausea, vomiting, abdominal pain, diarrhea, constipation, blood in stool and melena.  Genitourinary: Negative for dysuria, urgency, frequency, hematuria and flank pain.  Musculoskeletal: Negative for myalgias, back pain, joint pain and falls.  Skin: Negative for itching and rash.  Neurological: Negative for dizziness, tingling, tremors, sensory change, speech change, focal weakness, seizures, loss of consciousness, weakness and headaches.  Endo/Heme/Allergies: Negative for environmental allergies and polydipsia. Does not bruise/bleed easily.  VITAL SIGNS: Temp:  [97.6 F (36.4 C)-98.4 F (36.9 C)] 97.6 F (36.4 C) (08/05 0400) Pulse Rate:  [67-107] 82 (08/05 0600) Resp:  [16-36] 28 (08/05 0600) BP: (83-112)/(48-71) 103/67 (08/05 0600) SpO2:  [94 %-96 %] 94 % (08/05 0600) FiO2 (%):  [30 %] 30 % (  08/04 1600) Weight:  [132.1 kg] 132.1 kg (08/04 1730)  PHYSICAL EXAMINATION: General:  Pt lying in bed, comfortable on 2 L [place], no c/o pain Neuro:  A&O x 4, PERRLA, no focal/sensory deficits HEENT:  Atraumatic, normocephalic, no scleral icterus Cardiovascular:  S1, S2, distant sounds, irregular- Afib controlled, +2 palpable pulses, no JVD Lungs:  Expiratory wheezes auscultated throughout, no cough/sputum production, normal effort & rate on 2 L [place]ck Abdomen:  Soft, distended, non tender, active bowel sounds Musculoskeletal:  5/5 strength throughout Skin:  Warm, dry, BLE abrasions, non tenting,  no rashes/ulcerations  Recent Labs  Lab 03/04/19 1449 03/04/19 2109 03/05/19 0526 03/06/19 0437  NA 131*  --  131* 135  K 4.4  --  4.4 4.7  CL 96*  --  96* 100  CO2 17*  --  17* 19*  BUN 72*  --  91* 97*  CREATININE 3.94* 4.38* 4.82* 5.19*  GLUCOSE 199*  --  224* 197*   Recent Labs  Lab 03/04/19 1414 03/05/19 0526 03/06/19 0437  HGB 12.1* 11.8* 12.1*  HCT 37.0* 35.9* 37.0*  WBC 31.2* 39.0* 40.0*  PLT 331 364 443*   Ct Head Wo Contrast  Result Date: 03/04/2019 CLINICAL DATA:  Altered level of consciousness. EXAM: CT HEAD WITHOUT CONTRAST TECHNIQUE: Contiguous axial images were obtained from the base of the skull through the vertex without intravenous contrast. COMPARISON:  09/07/2016 FINDINGS: Brain: As seen previously, the brainstem and cerebellum are normal. There is ventricular prominence without evidence old or acute focal infarction of the cerebral hemispheres. No mass lesion, hemorrhage or extra-axial collection. Ventricular size is unchanged since 2018 and likely secondary to central atrophy. As noted previously, normal pressure hydrocephalus is not excluded. Vascular: No abnormal vascular finding. Skull: Negative Sinuses/Orbits: Clear/normal Other: None IMPRESSION: Chronic ventriculomegaly without evidence of focal brain insult. No visible change since February 2018. This is felt most likely secondary to central atrophy. Normal pressure hydrocephalus is not excluded but not favored. Electronically Signed   By: Nelson Chimes M.D.   On: 03/04/2019 16:16   US Renal  Result Date: 03/05/2019 CLINICAL DATA:  68 year old male with acute renal failure. EXAM: RENAL / URINARY TRACT ULTRASOUND COMPLETE COMPARISON:  Report of CT Abdomen and Pelvis 12/03/2002 (no images available). FINDINGS: Right Kidney: Renal measurements: 12.0 x 5.9 x 5.7 centimeters = volume: 210 mL . Echogenicity within normal limits. No mass or hydronephrosis visualized. Left Kidney: Renal measurements: 11.5 x 6.6 x 5.9  centimeters = volume: 233 mL. Echogenicity within normal limits. No mass or hydronephrosis visualized. Bladder: Faintly visible Foley catheter balloon, the urinary bladder appears decompressed (image 29). Other findings: Shadowing echogenic gallstones (image 16). IMPRESSION: 1. No acute renal findings.  Bladder decompressed by Foley catheter. 2. Cholelithiasis. Electronically Signed   By: Genevie Ann M.D.   On: 03/05/2019 11:49   Dg Chest Port 1 View  Result Date: 03/05/2019 CLINICAL DATA:  Central line placement. EXAM: PORTABLE CHEST 1 VIEW 2:37 p.m. COMPARISON:  03/05/2019 at 11:12 a.m. FINDINGS: Double lumen central venous catheter is been inserted. The tip is at the cavoatrial junction in good position. No pneumothorax. Moderate to large right pleural effusion, unchanged. Possible infiltrate or atelectasis at the right lung base. Cardiomegaly. Slight pulmonary vascular prominence. No discrete infiltrates or effusions on the left. No acute bone abnormality. AICD in place. IMPRESSION: 1. Central venous catheter in good position. No pneumothorax. 2. Persistent moderate to large right pleural effusion. Possible atelectasis or infiltrate at the right base. Electronically  Signed   By: Lorriane Shire M.D.   On: 03/05/2019 15:04   Dg Chest Port 1 View  Result Date: 03/05/2019 CLINICAL DATA:  Pleural effusion. EXAM: PORTABLE CHEST 1 VIEW COMPARISON:  Same day chest ultrasound 03/05/2019, same-day chest radiograph 03/05/2019. FINDINGS: Lower chest incompletely imaged. Redemonstrated left chest AICD device. Cardiomegaly. Persistent gradient opacity at the right lung base consistent with residual pleural effusion. Additional right basilar opacity compatible with atelectasis and/or pneumonia. No focal consolidation within the left lung. No evidence of pneumothorax. IMPRESSION: Atelectasis and/or pneumonia at the right lung base with residual right pleural effusion. Cardiomegaly. Electronically Signed   By: [name]    On: 03/05/2019 11:39   Dg Chest Port 1 View  Result Date: 03/05/2019 CLINICAL DATA:  Shortness of breath. EXAM: PORTABLE CHEST 1 VIEW COMPARISON:  03/04/2019. FINDINGS: Lower chest incompletely imaged. Cardiac pacer stable position. Stable cardiomegaly. Right lung base atelectasis/infiltrate and prominent right pleural effusion again noted. Small left pleural effusion noted. No pneumothorax. No acute bony abnormality. IMPRESSION: 1.  Cardiac pacer stable position.  Stable cardiomegaly. 2. Right lung base atelectasis/infiltrate and prominent right pleural effusion again noted. Small left pleural effusion noted. Chest is unchanged from prior exam. Electronically Signed   By: Marcello Moores  Register   On: 03/05/2019 05:39   Dg Chest Port 1 View  Result Date: 03/04/2019 CLINICAL DATA:  Shortness of breath. Respiratory distress. EXAM: PORTABLE CHEST 1 VIEW COMPARISON:  12/10/2017 FINDINGS: There is chronic cardiomegaly. AICD in place. There is a moderate right pleural effusion. Pulmonary vascularity is within normal limits. Suggestion of a small right left effusion but this is not definitive. No acute bone abnormality. IMPRESSION: 1. Moderate right pleural effusion. Possible small left effusion. 2. Chronic cardiomegaly. Electronically Signed   By: Lorriane Shire M.D.   On: 03/04/2019 14:42   US Thoracentesis Asp Pleural Space W/img Guide  Result Date: 03/05/2019 INDICATION: Patient with history of CHF, a. flutter s/p cardioversion who presented to [facility] ED on 03/04/19 with worsening dyspnea. Imaging shows left lower lobe pneumonia and right sided pleural effusion. Request has been made to IR for diagnostic thoracentesis at bedside due to patient being on BiPAP currently. EXAM: ULTRASOUND GUIDED RIGHT THORACENTESIS MEDICATIONS: 10 mL 1% lidocaine COMPLICATIONS: None immediate. PROCEDURE: An ultrasound guided thoracentesis was thoroughly discussed with the patient and questions answered. The benefits, risks, alternatives  and complications were also discussed. The patient understands and wishes to proceed with the procedure. Written consent was obtained. Ultrasound was performed to localize and mark an adequate pocket of fluid in the right chest. The area was then prepped and draped in the normal sterile fashion. 1% Lidocaine was used for local anesthesia. Under ultrasound guidance a 6 Fr Safe-T-Centesis catheter was introduced. Thoracentesis was performed. The catheter was removed and a dressing applied. FINDINGS: A total of approximately 200 mL of hazy yellow fluid was removed. Samples were sent to the laboratory as requested by the clinical team. IMPRESSION: Successful ultrasound guided right thoracentesis yielding 200 mL of pleural fluid. Read by Candiss Norse, PA-C Electronically Signed   By: Markus Daft M.D.   On: 03/05/2019 11:20      ASSESSMENT / PLAN:  Acute Respiratory Failure with hypoxia secondary to R sided pleural effusion CAP with empyema Hx: Combined systolic & diastolic heart failure, OSA Progression of worsening cardio-renal sydnrome - Supplemental oxygen PRN, maintain SpO2 > 92%, refused Bipap 8/4 - Continuous SpO2 monitoring - Continue Albuterol nebulizer PRN - Thoracentesis removed 200 mL of  yellow, cloudy fluid suggestive of an exudate and c/w empyema Consulted DR Shawn Route CT surgery-will plan for large Bore Pleural catheter placement via VIR for intrapleural thrombolytics Case discussed with VIR will obtain CT chest  Sepsis with septic shock secondary to pneumonia and empyema- - monitor WBC and fever curve, afebrile but WBC continues to trend up- 40 - Procalcitonin trending down - BC & UC pending - continuous cardiac monitoring - low dose levophed drip continued for persistent hypotension  - Continue cefepime  Elevated Troponin secondary to suspected demand ischemia - improved Hx: CAD, ICM s/p ICD, PAF, combined CHF - Trend Troponin: 56 ~ 50 - continuous cardiac monitoring - no  c/o CP & BLE edema resolved s/p HD  Acute Renal Failure secondary to hypovolemia vs septic shock - improved UOP, worsening renal function Metabolic Acidosis- improving serum bicarb & AG - Strict I&O's - Daily BMP, avoid nephrotoxic agents - Continue daily assessment of need for dialysis -Nephrology consult, appreciate input    DISPOSITION:  ICU GOALS OF CARE:  FULL CODE VTE Prophylaxis: Heparin SQ Stress Ulcer Prophylaxis: Protonix IV    Critical Care Time devoted to patient care services described in this note is 34  minutes.   Overall, patient is critically ill, prognosis is guarded.  Patient with Multiorgan failure and at high risk for cardiac arrest and death.    Corrin Parker, M.D.  [facility]  Medical Director [place] Director [facility] Cardio-Pulmonary Department

## 2019-03-06 NOTE — Progress Notes (Signed)
Pre HD Assessment:     03/06/19 1545  Neurological  Level of Consciousness Alert  Orientation Level Oriented X4  Respiratory  Respiratory Pattern Irregular;Unlabored  Chest Assessment Chest expansion symmetrical  Bilateral Breath Sounds Rhonchi  R Upper  Breath Sounds Rhonchi  L Upper Breath Sounds Rhonchi  R Lower Breath Sounds Diminished  L Lower Breath Sounds Rhonchi  Cough Non-productive  Cardiac  Pulse Irregular  Heart Sounds S1, S2  Jugular Venous Distention (JVD) No  ECG Monitor Yes  Cardiac Rhythm Atrial fibrillation  Vascular  R Radial Pulse +2  L Radial Pulse +2  Musculoskeletal  Musculoskeletal (WDL) X  Generalized Weakness Yes  Psychosocial  Psychosocial (WDL) WDL

## 2019-03-06 NOTE — OR Nursing (Signed)
Dr Anselm Pancoast notified pt not NPO and received sq heparin at 0549 this am. Proceed with chest tube insertion however plan to give just fentanyl

## 2019-03-06 NOTE — Progress Notes (Addendum)
Pt observed in bed eating supper meal, A&O x 4, denies pain, SpO2 at 94% on 2-3L O2 via [place]. All vitals WNL. Kolluru, MD notified that Pt is stable at this time.     03/06/19 1930  Vitals  BP 109/73  MAP (mmHg) 86  Pulse Rate 79  ECG Heart Rate 95  Resp (!) 22  Oxygen Therapy  SpO2 93 %  O2 Device Nasal Cannula  O2 Flow Rate (L/min) 2 L/min

## 2019-03-06 NOTE — Progress Notes (Addendum)
HD Tx Completed:  Kolluru, MD notified of early completion of HD Tx d/t air detector alarm and being unable to resolve alarm. Pt maintaining SpO2 sat >90% on 2-3L O2 via [place]. Will monitor Pt at later time to ensure Pt stable.    03/06/19 1740  Vital Signs  Pulse Rate 97  Resp (!) 23  BP 96/62  Oxygen Therapy  SpO2 90 %  O2 Device Nasal Cannula  O2 Flow Rate (L/min) 2 L/min  Patient Activity (if Appropriate) In bed  Pulse Oximetry Type Continuous  Pain Assessment  Pain Scale 0-10  Pain Score 0  During Hemodialysis Assessment  Blood Flow Rate (mL/min) 250 mL/min  Arterial Pressure (mmHg) -100 mmHg  Venous Pressure (mmHg) 100 mmHg  Transmembrane Pressure (mmHg) 50 mmHg  Ultrafiltration Rate (mL/min) 480 mL/min  Dialysate Flow Rate (mL/min) 500 ml/min  Conductivity: Machine  13.7  HD Safety Checks Performed Yes  Intra-Hemodialysis Comments Tx completed  Note  Observations HD Tx ended 51 minutes early d/t unable to resolve air detector alarm.

## 2019-03-06 NOTE — Progress Notes (Signed)
Post HD Tx:    03/06/19 1800  Vital Signs  Temp 98.1 F (36.7 C)  Temp Source Oral  Pulse Rate 94  Pulse Rate Source Monitor  Resp (!) 22  BP 98/62  BP Location Right Arm  BP Method Automatic  Patient Position (if appropriate) Lying  Oxygen Therapy  SpO2 94 %  O2 Device Nasal Cannula  O2 Flow Rate (L/min) 2 L/min  Patient Activity (if Appropriate) In bed  Pulse Oximetry Type Continuous  Pain Assessment  Pain Scale 0-10  Pain Score 0  Post-Hemodialysis Assessment  Rinseback Volume (mL) 100 mL  KECN 24.7 V  Dialyzer Clearance Heavily streaked  Duration of HD Treatment -hour(s) 2.5 hour(s) (1 hr and 49 minutes)  Hemodialysis Intake (mL) 500 mL  UF Total -Machine (mL) 768 mL  Net UF (mL) 268 mL  Tolerated HD Treatment Yes  AVG/AVF Arterial Site Held (minutes)  (n/a )  AVG/AVF Venous Site Held (minutes)  (n/a )  Education / Care Plan  Dialysis Education Provided Yes  Documented Education in Care Plan Yes  Outpatient Plan of Care Reviewed and on Chart Yes

## 2019-03-06 NOTE — Progress Notes (Signed)
Pharmacy Antibiotic Note  Colton Lamb is a 68 y.o. male admitted on 03/04/2019 with SOB treating for PNA. CT chest showed complex right pleural effusion with gas, compatible with an empyema. Patient underwent right chest tube placement, which removed greater than 300 ml of cloudy yellow foul-smelling fluid. Currently receiving cefepime. Pharmacy has been consulted for vancomycin dosing.  Patient received emergent HD yesterday 8/4. Current plan is to assess patient for need for HD on a daily basis.  Plan: Vancomycin 2 g IV loading dose x 1. Will follow up plan for HD daily and give additional doses of vanc as clinically indicated. Plan is for patient to have HD again today. May give additional dose of vanc following HD depending on patient tolerance and length of therapy. Pharmacist to follow up.  Will change cefepime to 1 g IV q24h to be given in the evening after dialysis  Height: 6' (182.9 cm) Weight: 291 lb 3.6 oz (132.1 kg) IBW/kg (Calculated) : 77.6  Temp (24hrs), Avg:97.9 F (36.6 C), Min:97.6 F (36.4 C), Max:98.4 F (36.9 C)  Recent Labs  Lab 03/04/19 1414 03/04/19 1449 03/04/19 1629 03/04/19 2109 03/05/19 0526 03/06/19 0437  WBC 31.2*  --   --   --  39.0* 40.0*  CREATININE  --  3.94*  --  4.38* 4.82* 5.19*  LATICACIDVEN  --   --  2.7* 2.3*  --   --     Estimated Creatinine Clearance: 19.4 mL/min (A) (by C-G formula based on SCr of 5.19 mg/dL (H)).    Allergies  Allergen Reactions  . Iodine Other (See Comments)    Shortness of breath, swelling and hives  . Shrimp [Shellfish Allergy] Other (See Comments)    SWELLING    HIVES    SHORTNESS OF BREATH  . Tetracycline Rash    Antimicrobials this admission: Cefepime 8/3 >> Vancomycin 8/5 >>  Dose adjustments this admission: NA  Microbiology results: 8/3 BCx: NG 2 days 8/3 UCx: insignificant growth  8/3 MRSA PCR: negative 8/4 Body fluid Cx: pending  Thank you for allowing pharmacy to be a part of this  patient's care.  Tawnya Crook, PharmD Clinical Pharmacist 03/06/2019 1:32 PM

## 2019-03-06 NOTE — Progress Notes (Signed)
[facility]  ROUNDING NOTE   Subjective:   Emergent intermittent hemodialysis treatment yesterday. No UF. Tolerated treatment well.  Weaned off BIPAP and on nasal canula this morning.   Objective:  Vital signs in last 24 hours:  Temp:  [97.6 F (36.4 C)-98.4 F (36.9 C)] 97.6 F (36.4 C) (08/05 0700) Pulse Rate:  [67-107] 93 (08/05 0800) Resp:  [16-36] 21 (08/05 0800) BP: (83-112)/(48-83) 102/83 (08/05 0800) SpO2:  [93 %-96 %] 93 % (08/05 0800) FiO2 (%):  [30 %] 30 % (08/04 1600) Weight:  [132.1 kg] 132.1 kg (08/04 1730)  Weight change: 5.093 kg Filed Weights   03/04/19 1417 03/05/19 1524 03/05/19 1730  Weight: 127 kg 132.1 kg 132.1 kg    Intake/Output: I/O last 3 completed shifts: In: 765.4 [I.V.:645.3; Other:20; IV Piggyback:100.1] Out: 485 [Urine:285; Other:200]   Intake/Output this shift:  Total I/O In: 95.8 [I.V.:95.8] Out: -   Physical Exam: General: Critically ill  Head: Normocephalic, atraumatic. Moist oral mucosal membranes  Eyes: Anicteric, PERRL  Neck: Supple, trachea midline  Lungs:  Bilateral crackles, right sided rhonchi, diminished bilaterally, 2L [place]  Heart: irregular  Abdomen:  Soft, nontender, obese  Extremities:  + peripheral edema.  Neurologic: Nonfocal, moving all four extremities  Skin: No lesions  Access: Right IJ temp HD catheter 8/4    Basic Metabolic Panel: Recent Labs  Lab 03/04/19 1449 03/04/19 2109 03/05/19 0526 03/05/19 1536 03/06/19 0437  NA 131*  --  131*  --  135  K 4.4  --  4.4  --  4.7  CL 96*  --  96*  --  100  CO2 17*  --  17*  --  19*  GLUCOSE 199*  --  224*  --  197*  BUN 72*  --  91*  --  97*  CREATININE 3.94* 4.38* 4.82*  --  5.19*  CALCIUM 7.6*  --  8.0*  --  7.9*  PHOS  --   --   --  7.9* 8.3*    Liver Function Tests: Recent Labs  Lab 03/06/19 0437  ALBUMIN 2.3*   No results for input(s): LIPASE, AMYLASE in the last 168 hours. No results for input(s): AMMONIA in the last 168  hours.  CBC: Recent Labs  Lab 03/04/19 1414 03/05/19 0526 03/06/19 0437  WBC 31.2* 39.0* 40.0*  NEUTROABS 27.8*  --   --   HGB 12.1* 11.8* 12.1*  HCT 37.0* 35.9* 37.0*  MCV 85.1 85.3 83.7  PLT 331 364 443*    Cardiac Enzymes: No results for input(s): CKTOTAL, CKMB, CKMBINDEX, TROPONINI in the last 168 hours.  BNP: Invalid input(s): POCBNP  CBG: Recent Labs  Lab 03/05/19 1624 03/05/19 2034 03/05/19 2351 03/06/19 0444 03/06/19 0740  GLUCAP 155* 184* 190* 203* 174*    Microbiology: Results for orders placed or performed during the hospital encounter of 03/04/19  Urine culture     Status: Abnormal   Collection Time: 03/04/19  2:14 PM   Specimen: Urine, Clean Catch  Result Value Ref Range Status   Specimen Description   Final    URINE, CLEAN CATCH Performed at [facility], [address], [place], [place] [address]    Special Requests   Final    NONE Performed at [facility], [address] [place], [place] [address]    Culture (A)  Final    <10,000 COLONIES/mL INSIGNIFICANT GROWTH Performed at [facility], [place] [address], [place], [place] [address]  Report Status 03/06/2019 FINAL  Final  SARS Coronavirus 2 [facility] order, Performed in [facility] hospital lab) Nasopharyngeal Nasopharyngeal Swab     Status: None   Collection Time: 03/04/19  2:17 PM   Specimen: Nasopharyngeal Swab  Result Value Ref Range Status   SARS Coronavirus 2 NEGATIVE NEGATIVE Final    Comment: (NOTE) If result is NEGATIVE SARS-CoV-2 target nucleic acids are NOT DETECTED. The SARS-CoV-2 RNA is generally detectable in upper and lower  respiratory specimens during the acute phase of infection. The lowest  concentration of SARS-CoV-2 viral copies this assay can detect is 250  copies / mL. A negative result does not preclude SARS-CoV-2 infection  and should not be used as the sole basis for treatment or other  patient management decisions.  A negative  result may occur with  improper specimen collection / handling, submission of specimen other  than nasopharyngeal swab, presence of viral mutation(s) within the  areas targeted by this assay, and inadequate number of viral copies  (<250 copies / mL). A negative result must be combined with clinical  observations, patient history, and epidemiological information. If result is POSITIVE SARS-CoV-2 target nucleic acids are DETECTED. The SARS-CoV-2 RNA is generally detectable in upper and lower  respiratory specimens dur ing the acute phase of infection.  Positive  results are indicative of active infection with SARS-CoV-2.  Clinical  correlation with patient history and other diagnostic information is  necessary to determine patient infection status.  Positive results do  not rule out bacterial infection or co-infection with other viruses. If result is PRESUMPTIVE POSTIVE SARS-CoV-2 nucleic acids MAY BE PRESENT.   A presumptive positive result was obtained on the submitted specimen  and confirmed on repeat testing.  While 2019 novel coronavirus  (SARS-CoV-2) nucleic acids may be present in the submitted sample  additional confirmatory testing may be necessary for epidemiological  and / or clinical management purposes  to differentiate between  SARS-CoV-2 and other Sarbecovirus currently known to infect humans.  If clinically indicated additional testing with an alternate test  methodology [phone]) is advised. The SARS-CoV-2 RNA is generally  detectable in upper and lower respiratory sp ecimens during the acute  phase of infection. The expected result is Negative. Fact Sheet for Patients:  StrictlyIdeas.no Fact Sheet for Healthcare Providers: BankingDealers.co.za This test is not yet approved or cleared by the [place] FDA and has been authorized for detection and/or diagnosis of SARS-CoV-2 by FDA under an Emergency Use Authorization  (EUA).  This EUA will remain in effect (meaning this test can be used) for the duration of the COVID-19 declaration under Section 564(b)(1) of the Act, 21 U.S.C. section 360bbb-3(b)(1), unless the authorization is terminated or revoked sooner. Performed at [facility], [address] [place], [place] [address]   Blood Culture (routine x 2)     Status: None (Preliminary result)   Collection Time: 03/04/19  2:55 PM   Specimen: BLOOD  Result Value Ref Range Status   Specimen Description BLOOD BLOOD RIGHT HAND  Final   Special Requests   Final    BOTTLES DRAWN AEROBIC AND ANAEROBIC Blood Culture adequate volume   Culture   Final    NO GROWTH 2 DAYS Performed at [facility], [address] [place], [place] [address]    Report Status PENDING  Incomplete  MRSA PCR Screening     Status: None   Collection Time: 03/04/19  8:36 PM   Specimen: Nasopharyngeal  Result Value Ref Range  Status   MRSA by PCR NEGATIVE NEGATIVE Final    Comment:        The GeneXpert MRSA Assay (FDA approved for NASAL specimens only), is one component of a comprehensive MRSA colonization surveillance program. It is not intended to diagnose MRSA infection nor to guide or monitor treatment for MRSA infections. Performed at [facility], [address] [place], [place] [address]   Blood Culture (routine x 2)     Status: None (Preliminary result)   Collection Time: 03/04/19 10:01 PM   Specimen: BLOOD  Result Value Ref Range Status   Specimen Description BLOOD RIGHT ASSIST CONTROL  Final   Special Requests   Final    BOTTLES DRAWN AEROBIC AND ANAEROBIC Blood Culture adequate volume   Culture   Final    NO GROWTH 2 DAYS Performed at [facility], [address] [place], [place] [address]    Report Status PENDING  Incomplete  Body fluid culture     Status: None (Preliminary result)   Collection Time: 03/05/19 11:21 AM   Specimen: [facility] Cytology Pleural fluid   Result Value Ref Range Status   Specimen Description   Final    PLEURAL Performed at [facility], [address], [place], [place] [address]    Special Requests   Final    NONE Performed at [facility], [address], [place], [place] [address]    Gram Stain   Final    Emmit [place] NEGATIVE RODS ABUNDANT GRAM POSITIVE COCCI FEW GRAM VARIABLE ROD Performed at [facility], [place] [address], [place],  [address]    Culture PENDING  Incomplete   Report Status PENDING  Incomplete    Coagulation Studies: No results for input(s): LABPROT, INR in the last 72 hours.  Urinalysis: Recent Labs    03/04/19 1414  COLORURINE AMBER*  LABSPEC 1.016  PHURINE 5.0  GLUCOSEU >=500*  HGBUR MODERATE*  BILIRUBINUR NEGATIVE  KETONESUR NEGATIVE  PROTEINUR NEGATIVE  NITRITE NEGATIVE  LEUKOCYTESUR NEGATIVE      Imaging: Ct Head Wo Contrast  Result Date: 03/04/2019 CLINICAL DATA:  Altered level of consciousness. EXAM: CT HEAD WITHOUT CONTRAST TECHNIQUE: Contiguous axial images were obtained from the base of the skull through the vertex without intravenous contrast. COMPARISON:  09/07/2016 FINDINGS: Brain: As seen previously, the brainstem and cerebellum are normal. There is ventricular prominence without evidence old or acute focal infarction of the cerebral hemispheres. No mass lesion, hemorrhage or extra-axial collection. Ventricular size is unchanged since 2018 and likely secondary to central atrophy. As noted previously, normal pressure hydrocephalus is not excluded. Vascular: No abnormal vascular finding. Skull: Negative Sinuses/Orbits: Clear/normal Other: None IMPRESSION: Chronic ventriculomegaly without evidence of focal brain insult. No visible change since February 2018. This is felt most likely secondary to central atrophy. Normal pressure hydrocephalus is not excluded but not favored. Electronically Signed   By: Nelson Chimes M.D.   On: 03/04/2019 16:16    US Renal  Result Date: 03/05/2019 CLINICAL DATA:  68 year old male with acute renal failure. EXAM: RENAL / URINARY TRACT ULTRASOUND COMPLETE COMPARISON:  Report of CT Abdomen and Pelvis 12/03/2002 (no images available). FINDINGS: Right Kidney: Renal measurements: 12.0 x 5.9 x 5.7 centimeters = volume: 210 mL . Echogenicity within normal limits. No mass or hydronephrosis visualized. Left Kidney: Renal measurements: 11.5 x 6.6 x 5.9 centimeters = volume: 233 mL. Echogenicity within normal limits. No mass or hydronephrosis visualized. Bladder: Faintly visible Foley catheter balloon, the urinary  bladder appears decompressed (image 29). Other findings: Shadowing echogenic gallstones (image 16). IMPRESSION: 1. No acute renal findings.  Bladder decompressed by Foley catheter. 2. Cholelithiasis. Electronically Signed   By: Genevie Ann M.D.   On: 03/05/2019 11:49   Dg Chest Port 1 View  Result Date: 03/05/2019 CLINICAL DATA:  Central line placement. EXAM: PORTABLE CHEST 1 VIEW 2:37 p.m. COMPARISON:  03/05/2019 at 11:12 a.m. FINDINGS: Double lumen central venous catheter is been inserted. The tip is at the cavoatrial junction in good position. No pneumothorax. Moderate to large right pleural effusion, unchanged. Possible infiltrate or atelectasis at the right lung base. Cardiomegaly. Slight pulmonary vascular prominence. No discrete infiltrates or effusions on the left. No acute bone abnormality. AICD in place. IMPRESSION: 1. Central venous catheter in good position. No pneumothorax. 2. Persistent moderate to large right pleural effusion. Possible atelectasis or infiltrate at the right base. Electronically Signed   By: Lorriane Shire M.D.   On: 03/05/2019 15:04   Dg Chest Port 1 View  Result Date: 03/05/2019 CLINICAL DATA:  Pleural effusion. EXAM: PORTABLE CHEST 1 VIEW COMPARISON:  Same day chest ultrasound 03/05/2019, same-day chest radiograph 03/05/2019. FINDINGS: Lower chest incompletely imaged. Redemonstrated left  chest AICD device. Cardiomegaly. Persistent gradient opacity at the right lung base consistent with residual pleural effusion. Additional right basilar opacity compatible with atelectasis and/or pneumonia. No focal consolidation within the left lung. No evidence of pneumothorax. IMPRESSION: Atelectasis and/or pneumonia at the right lung base with residual right pleural effusion. Cardiomegaly. Electronically Signed   By: [name]   On: 03/05/2019 11:39   Dg Chest Port 1 View  Result Date: 03/05/2019 CLINICAL DATA:  Shortness of breath. EXAM: PORTABLE CHEST 1 VIEW COMPARISON:  03/04/2019. FINDINGS: Lower chest incompletely imaged. Cardiac pacer stable position. Stable cardiomegaly. Right lung base atelectasis/infiltrate and prominent right pleural effusion again noted. Small left pleural effusion noted. No pneumothorax. No acute bony abnormality. IMPRESSION: 1.  Cardiac pacer stable position.  Stable cardiomegaly. 2. Right lung base atelectasis/infiltrate and prominent right pleural effusion again noted. Small left pleural effusion noted. Chest is unchanged from prior exam. Electronically Signed   By: Marcello Moores  Register   On: 03/05/2019 05:39   Dg Chest Port 1 View  Result Date: 03/04/2019 CLINICAL DATA:  Shortness of breath. Respiratory distress. EXAM: PORTABLE CHEST 1 VIEW COMPARISON:  12/10/2017 FINDINGS: There is chronic cardiomegaly. AICD in place. There is a moderate right pleural effusion. Pulmonary vascularity is within normal limits. Suggestion of a small right left effusion but this is not definitive. No acute bone abnormality. IMPRESSION: 1. Moderate right pleural effusion. Possible small left effusion. 2. Chronic cardiomegaly. Electronically Signed   By: Lorriane Shire M.D.   On: 03/04/2019 14:42   US Thoracentesis Asp Pleural Space W/img Guide  Result Date: 03/05/2019 INDICATION: Patient with history of CHF, a. flutter s/p cardioversion who presented to [facility] ED on 03/04/19 with worsening dyspnea.  Imaging shows left lower lobe pneumonia and right sided pleural effusion. Request has been made to IR for diagnostic thoracentesis at bedside due to patient being on BiPAP currently. EXAM: ULTRASOUND GUIDED RIGHT THORACENTESIS MEDICATIONS: 10 mL 1% lidocaine COMPLICATIONS: None immediate. PROCEDURE: An ultrasound guided thoracentesis was thoroughly discussed with the patient and questions answered. The benefits, risks, alternatives and complications were also discussed. The patient understands and wishes to proceed with the procedure. Written consent was obtained. Ultrasound was performed to localize and mark an adequate pocket of fluid in the right chest. The area was then  prepped and draped in the normal sterile fashion. 1% Lidocaine was used for local anesthesia. Under ultrasound guidance a 6 Fr Safe-T-Centesis catheter was introduced. Thoracentesis was performed. The catheter was removed and a dressing applied. FINDINGS: A total of approximately 200 mL of hazy yellow fluid was removed. Samples were sent to the laboratory as requested by the clinical team. IMPRESSION: Successful ultrasound guided right thoracentesis yielding 200 mL of pleural fluid. Read by Candiss Norse, PA-C Electronically Signed   By: Markus Daft M.D.   On: 03/05/2019 11:20     Medications:   . sodium chloride 5 mL/hr at 03/06/19 0800  . ceFEPime (MAXIPIME) IV Stopped (03/05/19 1819)  . norepinephrine (LEVOPHED) Adult infusion 3 mcg/min (03/06/19 0800)   . aspirin EC  81 mg Oral Daily  . atorvastatin  80 mg Oral Daily  . Chlorhexidine Gluconate Cloth  6 each Topical Q0600  . heparin  5,000 Units Subcutaneous Q8H  . insulin aspart  0-9 Units Subcutaneous Q4H  . niacin  1,000 mg Oral QHS  . pantoprazole (PROTONIX) IV  40 mg Intravenous Daily   sodium chloride, acetaminophen **OR** acetaminophen, albuterol, nitroGLYCERIN, ondansetron **OR** ondansetron (ZOFRAN) IV, polyethylene glycol  Assessment/ Plan:  [name] is a 68 y.o. white male with diabetes mellitus type II, hypertension, obstructive sleep apnea, congestive heart failure, atrial fibrillation, hyperlipidemia, Crohn's disease, coronary artery disease, GERD, pacemaker, morbid obesity,  , who was admitted to [facility] on acute exacerbation of COPD, acute exacerbation of congestive heart failure, right pleural effusion, acute respiratory failure requiring BIPAP, sepsis with pneumonia and acute renal failure requiring hemodialysis   1. Acute renal failure: baseline creatinine of 1.1, normal GFR on 01/02/2019.  wiith underlying diabetic nephropathy with glucosuria.  - oliguric urine output - emergent hemodialysis treatment yesterday.  - Plan on second hemodialysis treatment today. Orders prepared. Evaluate daily for dialysis need.   2. Acute respiratory failure: requiring noninvasive mechanical ventilation.  Secondary to pneumonia, congestive heart failure and COPD exacerbation Right pleural effusion status post right sided thoracentesis on 8/4 yielding 224m.   3. Acute exacerbation of systolic congestive heart failure: 7/10 echocardiogram 35-40%  4. Sepsis with hypotension: cultures pending. Requiring vasopressors. - Empiric cefepime.  - Currently on norepinephrine     LOS: 2 Robbin Escher 8/5/20209:54 AM

## 2019-03-06 NOTE — OR Nursing (Signed)
Telephone Report given to Clarene Reamer but bedside handoff given to [employer].

## 2019-03-06 NOTE — Progress Notes (Signed)
HD Tx Initiated:  MD notified of additional fluid given during initiation of Tx and MD has increased UF to 0.7. Pt A&O x 4, denies pain or discomfort. Respirations accelerated, SpO2 at 94% [place] on 2L.    03/06/19 1549  Vital Signs  Temp (!) 97.4 F (36.3 C)  Temp Source Oral  Pulse Rate 85  Pulse Rate Source Monitor  Resp (!) 36  BP 100/60  BP Location Right Arm  BP Method Automatic  Patient Position (if appropriate) Lying  Oxygen Therapy  SpO2 97 %  O2 Device Nasal Cannula  O2 Flow Rate (L/min) 2 L/min  Patient Activity (if Appropriate) In bed  Pulse Oximetry Type Continuous  Pain Assessment  Pain Scale 0-10  Pain Score 0  Dialysis Weight  Weight 129.9 kg  Type of Weight Pre-Dialysis  During Hemodialysis Assessment  Blood Flow Rate (mL/min) 250 mL/min  Arterial Pressure (mmHg) -90 mmHg  Venous Pressure (mmHg) 90 mmHg  Transmembrane Pressure (mmHg) 50 mmHg  Ultrafiltration Rate (mL/min) 480 mL/min  Dialysate Flow Rate (mL/min) 500 ml/min  Conductivity: Machine  14.2  HD Safety Checks Performed Yes  Intra-Hemodialysis Comments Tx initiated  Hemodialysis Catheter Right Internal jugular Triple lumen Temporary (Non-Tunneled)  Placement Date/Time: 03/05/19 1450   Placed prior to admission: No  Time Out: Correct procedure;Correct site;Correct patient  Maximum sterile barrier precautions: Large sterile sheet;Sterile gown;Cap;Hand hygiene;Sterile gloves;Mask  Site Prep: Chlorh...  Site Condition No complications  Blue Lumen Status Blood return noted  Red Lumen Status Blood return noted  Purple Lumen Status N/A  Dressing Type Biopatch;Occlusive  Dressing Status Clean;Dry;Intact  Drainage Description None  Dressing Change Due 03/12/19

## 2019-03-06 NOTE — Progress Notes (Signed)
Patient ID: Colton Lamb, male   DOB: September 11, 1950, 68 y.o.   MRN: [id number]  Chief Complaint  Patient presents with  . Shortness of Breath    Referred By Dr. Woody Seller Reason for Referral empyema right chest  HPI Location, Quality, Duration, Severity, Timing, Context, Modifying Factors, Associated Signs and Symptoms.  Colton Lamb is a 68 y.o. male.  He states that about 10 days ago he began feeling ill.  Initially thought this was a sinusitis and over the course of a couple days became progressively more short of breath and overall a feeling of more lethargy.  He came into the emergency department and was found to have a pneumonia and then developed acute renal failure multisystem organ failure.  He recently had a thoracentesis of a frankly purulent right-sided pleural effusion.  That was consistent with an empyema and today had a percutaneous drain placed for administration of intrapleural thrombolytics.  He does not smoke.  He does have an extensive past medical history including an ischemic cardiomyopathy.  He has had multiple stents placed.  He has a history of Crohn's disease, hyperlipidemia and hypertension.  He states that since his chest tube was placed he feels better.  He is currently undergoing dialysis.  He does have a Foley in place and is making some urine.  He also has a chest tube in place which is draining pure purulent fluid.   Past Medical History:  Diagnosis Date  . Atrial flutter ([place])    a. s/p Cardioversion 11/22/13, on amiodarone and Xarelto.  . Chronic systolic heart failure ([place])    a. 10/2013 EF 20-25%, grade III DD, RV mildly dilated and sys fx mild/mod reduced;  b. 01/2014 Echo: EF 30-35%, gr3 DD, mod dil LA.  Marland Kitchen Coronary artery disease    a. s/p MI 2007/2015;  b. s/p prior PCI to the LAD/LCX/PDA/PL;  c. 2008: s/p Cypher DES to the OM.  Marland Kitchen Crohn's ileocolitis ([place])   . GERD (gastroesophageal reflux disease)   . Hx of adenomatous colonic polyps 11/2003  .  Hyperlipidemia   . Hypertension   . Ischemic cardiomyopathy    s. 01/2014 s/p MDT [id number] [name] XT DR single lead AICD.  Marland Kitchen Obesity   . Paroxysmal atrial fibrillation ([facility])    a. CHA2DS2VASc = 4-->xarelto/amio.  . Sleep apnea   . Syncope    a.  11/2013 in setting of volume depletion and bradycardia due to dig toxicity   . Type II diabetes mellitus ([place])     Past Surgical History:  Procedure Laterality Date  . ATRIAL FLUTTER ABLATION N/A 04/16/2014   Procedure: ATRIAL FLUTTER ABLATION;  Surgeon: Evans Lance, MD;  Location: [facility] CATH LAB;  Service: Cardiovascular;  Laterality: N/A;  . CARDIAC CATHETERIZATION  10/2013  . CARDIAC DEFIBRILLATOR PLACEMENT  04/16/2014   Medtronic Evira device  . CARDIAC ELECTROPHYSIOLOGY STUDY AND ABLATION  04/16/2014   atrial flutter ablation  . CARDIOVERSION N/A 03/05/2014   Procedure: CARDIOVERSION;  Surgeon: Jolaine Artist, MD;  Location[facility] ENDOSCOPY;  Service: Cardiovascular;  Laterality: N/A;  . CATARACT EXTRACTION W/PHACO Right 01/04/2017   Procedure: CATARACT EXTRACTION PHACO AND INTRAOCULAR LENS PLACEMENT ([place])  Right Diabetic Complicated;  Surgeon: Leandrew Koyanagi, MD;  Location: [place];  Service: Ophthalmology;  Laterality: Right;  Diabetic  . CATARACT EXTRACTION W/PHACO Left 02/08/2017   Procedure: CATARACT EXTRACTION PHACO AND INTRAOCULAR LENS PLACEMENT ([place]) left diabetic;  Surgeon: Leandrew Koyanagi, MD;  Location: [place];  Service:  Ophthalmology;  Laterality: Left;  Diabetic - oral meds sleep apnea  . CORONARY ANGIOPLASTY WITH STENT PLACEMENT  2007; 2008 X 2   "1+1 ~ 1"  . FOOT SURGERY Left    bone spur  . HYDROCELE EXCISION Bilateral   . 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 IMPLANTABLE CARDIOVERTER DEFIBRILLATOR IMPLANT N/A 04/16/2014   Procedure: IMPLANTABLE CARDIOVERTER DEFIBRILLATOR IMPLANT;  Surgeon: Evans Lance, MD;  Location: [facility] CATH LAB;  Service: Cardiovascular;   Laterality: N/A;  . LEFT HEART CATHETERIZATION WITH CORONARY ANGIOGRAM N/A 11/22/2013   Procedure: LEFT HEART CATHETERIZATION WITH CORONARY ANGIOGRAM;  Surgeon: Sinclair Grooms, MD;  Location: [facility] CATH LAB;  Service: Cardiovascular;  Laterality: N/A;    Family History  Problem Relation Age of Onset  . Breast cancer Mother   . Heart disease Father   . Heart attack Father   . Colon cancer Neg Hx     Social History Social History   Tobacco Use  . Smoking status: Never Smoker  . Smokeless tobacco: Never Used  Substance Use Topics  . Alcohol use: No  . Drug use: No    Allergies  Allergen Reactions  . Iodine Other (See Comments)    Shortness of breath, swelling and hives  . Shrimp [Shellfish Allergy] Other (See Comments)    SWELLING    HIVES    SHORTNESS OF BREATH  . Tetracycline Rash    Current Facility-Administered Medications  Medication Dose Route Frequency Provider Last Rate Last Dose  . 0.9 %  sodium chloride infusion   Intravenous PRN Flora Lipps, MD   Stopped at 03/06/19 1411  . acetaminophen (TYLENOL) tablet 650 mg  650 mg Oral Q6H PRN [name], MD       Or  . acetaminophen (TYLENOL) suppository 650 mg  650 mg Rectal Q6H PRN [name], MD      . albuterol (PROVENTIL) (2.5 MG/3ML) 0.083% nebulizer solution 2.5 mg  2.5 mg Nebulization Q2H PRN [name], MD   2.5 mg at 03/06/19 0737  . aspirin EC tablet 81 mg  81 mg Oral Daily [name], MD   81 mg at 03/06/19 1043  . atorvastatin (LIPITOR) tablet 80 mg  80 mg Oral Daily [name], MD   80 mg at 03/06/19 1043  . budesonide (PULMICORT) nebulizer solution 0.25 mg  0.25 mg Nebulization BID Flora Lipps, MD   0.25 mg at 03/06/19 1135  . ceFEPIme (MAXIPIME) 1 g in sodium chloride 0.9 % 100 mL IVPB  1 g Intravenous Q24H [place], Abby K, RPH 200 mL/hr at 03/06/19 1718 1 g at 03/06/19 1718  . Chlorhexidine Gluconate Cloth 2 % PADS 6 each  6 each Topical Q0600 [name], MD   6 each at 03/05/19 0534  . fentaNYL  (SUBLIMAZE) 100 MCG/2ML injection           . heparin injection 5,000 Units  5,000 Units Subcutaneous Q8H [name], MD   5,000 Units at 03/06/19 1352  . insulin aspart (novoLOG) injection 0-9 Units  0-9 Units Subcutaneous Q4H Awilda Bill, NP   3 Units at 03/06/19 1655  . ipratropium-albuterol (DUONEB) 0.5-2.5 (3) MG/3ML nebulizer solution 3 mL  3 mL Nebulization Q4H Flora Lipps, MD   3 mL at 03/06/19 1605  . methylPREDNISolone sodium succinate (SOLU-MEDROL) 40 mg/mL injection 40 mg  40 mg Intravenous Q12H Flora Lipps, MD   40 mg at 03/06/19 1044  . midazolam (VERSED) 2 MG/2ML injection           .  niacin (SLO-NIACIN) CR tablet 1,000 mg  1,000 mg Oral QHS Charlett Nose, RPH   1,000 mg at 03/05/19 2118  . nitroGLYCERIN (NITROSTAT) SL tablet 0.4 mg  0.4 mg Sublingual Q5 min PRN [name], MD      . norepinephrine (LEVOPHED) 45m in 259mpremix infusion  0-40 mcg/min Intravenous Titrated BlAwilda BillNP 11.25 mL/hr at 03/06/19 1500 3 mcg/min at 03/06/19 1500  . ondansetron (ZOFRAN) tablet 4 mg  4 mg Oral Q6H PRN PaFritzi MandesMD       Or  . ondansetron (Z[facility] LLCinjection 4 mg  4 mg Intravenous Q6H PRN PaFritzi MandesMD      . pantoprazole (PROTONIX) injection 40 mg  40 mg Intravenous Daily BlAwilda BillNP   40 mg at 03/06/19 1044  . polyethylene glycol (MIRALAX / GLYCOLAX) packet 17 g  17 g Oral Daily PRN PaFritzi MandesMD          Review of Systems A complete review of systems was asked and was negative except for the following positive findings history of weight loss and weight gain, poor deconditioning with shortness of breath with activities  Blood pressure 98/67, pulse 97, temperature (!) 97.4 F (36.3 C), temperature source Oral, resp. rate (!) 31, height 6' (1.829 m), weight 129.9 kg, SpO2 93 %.  Physical Exam CONSTITUTIONAL:  Pleasant, well-developed, well-nourished, and in no acute distress. EYES: Pupils equal and reactive to light, Sclera non-icteric EARS, NOSE,  MOUTH AND THROAT:  The oropharynx was clear.  Dentition is poor repair.  Oral mucosa pink and moist. LYMPH NODES:  Lymph nodes in the neck and axillae were normal RESPIRATORY:  Lungs were clear but very distant.  Normal respiratory effort without pathologic use of accessory muscles of respiration CARDIOVASCULAR: Heart was regular without murmurs.  There were no carotid bruits. GI: The abdomen was soft, nontender, and nondistended. There were no palpable masses. There was no hepatosplenomegaly. There were normal bowel sounds in all quadrants. GU:  Rectal deferred.   MUSCULOSKELETAL:  Normal muscle strength and tone.  No clubbing or cyanosis.   SKIN:  There were no pathologic skin lesions.  There were no nodules on palpation. NEUROLOGIC:  Sensation is normal.  Cranial nerves are grossly intact. PSYCH:  Oriented to person, place and time.  Mood and affect are normal.  Data Reviewed CT scans chest x-rays  I have personally reviewed the patient's imaging, laboratory findings and medical records.    Assessment    I have independently reviewed the patient's CT scan.  It does appear as if he has an extensive right lower lobe pneumonic process with perhaps even an intra-parenchymal lung abscess.  There is a moderate sized pleural effusion with a drain in place.  The drain is currently draining purulent material.    Plan    I had a long discussion him today with the patient.  Have also discussed his care with Dr. CoChristel Mormon Given his poor surgical risk I would try intrapleural thrombolytics first.  If that is unsuccessful he may need a more aggressive surgical debridement.  However I would like to try with a minimally invasive approach.  I explained this to the patient and he is in agreement.  He does understand the risks involved with intrapleural thrombolytics.  Risks of bleeding were discussed extensively.       TiNestor LewandowskyMD 03/06/2019, 5:35 PM   Patient ID: HaJob Foundsmale   DOB:  [birth year].o.  MRN: [id number]

## 2019-03-06 NOTE — Sedation Documentation (Signed)
Holding pain med at this time due to hypotension titrating levophed up

## 2019-03-06 NOTE — Progress Notes (Signed)
Patient ID: Colton Lamb, male   DOB: 1951-06-28, 68 y.o.   MRN: [id number] IR was consulted for imaged guided right chest tube placement based on lab results from yesterday's thoracentesis.  Requested CT Chest prior to chest tube placement.  Patient came to radiology and CT demonstrates a complex right pleural effusion with gas, compatible with an empyema.  Discussed with patient and he is agreeable to chest tube placement with CT guidance.  Patient ate breakfast this morning so we will perform procedure with pain medications only.

## 2019-03-06 NOTE — Progress Notes (Signed)
Post HD Tx Assessment:     03/06/19 1800  Neurological  Level of Consciousness Alert  Orientation Level Oriented X4  Respiratory  Respiratory Pattern Regular;Shallow  Chest Assessment Chest expansion symmetrical  Bilateral Breath Sounds Rhonchi  R Upper  Breath Sounds Rhonchi  L Upper Breath Sounds Rhonchi  R Lower Breath Sounds Diminished  L Lower Breath Sounds Rhonchi  Cough Non-productive (Did not expectorate during Tx)  Cardiac  Pulse Irregular  Heart Sounds S1, S2  Jugular Venous Distention (JVD) No  ECG Monitor Yes  Cardiac Rhythm Atrial fibrillation;Other (Comment) (pair OVC's)  Vascular  R Radial Pulse +2  L Radial Pulse +2  Musculoskeletal  Musculoskeletal (WDL) X  Generalized Weakness Yes  Psychosocial  Psychosocial (WDL) X

## 2019-03-07 ENCOUNTER — Other Ambulatory Visit: Payer: Self-pay

## 2019-03-07 DIAGNOSIS — N170 Acute kidney failure with tubular necrosis: Secondary | ICD-10-CM

## 2019-03-07 DIAGNOSIS — N179 Acute kidney failure, unspecified: Secondary | ICD-10-CM

## 2019-03-07 DIAGNOSIS — J869 Pyothorax without fistula: Secondary | ICD-10-CM

## 2019-03-07 DIAGNOSIS — R0602 Shortness of breath: Secondary | ICD-10-CM

## 2019-03-07 LAB — BASIC METABOLIC PANEL
Anion gap: 15 (ref 5–15)
BUN: 73 mg/dL — ABNORMAL HIGH (ref 8–23)
CO2: 24 mmol/L (ref 22–32)
Calcium: 7.8 mg/dL — ABNORMAL LOW (ref 8.9–10.3)
Chloride: 98 mmol/L (ref 98–111)
Creatinine, Ser: 3.62 mg/dL — ABNORMAL HIGH (ref 0.61–1.24)
GFR calc Af Amer: 19 mL/min — ABNORMAL LOW (ref >60)
GFR calc non Af Amer: 16 mL/min — ABNORMAL LOW (ref >60)
Glucose, Bld: 244 mg/dL — ABNORMAL HIGH (ref 70–99)
Potassium: 4.1 mmol/L (ref 3.5–5.1)
Sodium: 137 mmol/L (ref 135–145)

## 2019-03-07 LAB — RENAL FUNCTION PANEL
Albumin: 1.9 g/dL — ABNORMAL LOW (ref 3.5–5.0)
Anion gap: 17 — ABNORMAL HIGH (ref 5–15)
BUN: 100 mg/dL — ABNORMAL HIGH (ref 8–23)
CO2: 20 mmol/L — ABNORMAL LOW (ref 22–32)
Calcium: 7.8 mg/dL — ABNORMAL LOW (ref 8.9–10.3)
Chloride: 101 mmol/L (ref 98–111)
Creatinine, Ser: 4.94 mg/dL — ABNORMAL HIGH (ref 0.61–1.24)
GFR calc Af Amer: 13 mL/min — ABNORMAL LOW (ref >60)
GFR calc non Af Amer: 11 mL/min — ABNORMAL LOW (ref >60)
Glucose, Bld: 260 mg/dL — ABNORMAL HIGH (ref 70–99)
Phosphorus: 6.9 mg/dL — ABNORMAL HIGH (ref 2.5–4.6)
Potassium: 4.4 mmol/L (ref 3.5–5.1)
Sodium: 138 mmol/L (ref 135–145)

## 2019-03-07 LAB — BLOOD GAS, ARTERIAL
Acid-base deficit: 3.9 mmol/L — ABNORMAL HIGH (ref 0.0–2.0)
Bicarbonate: 20.8 mmol/L (ref 20.0–28.0)
Delivery systems: POSITIVE
Expiratory PAP: 8
FIO2: 0.3
Inspiratory PAP: 18
O2 Saturation: 95 %
Patient temperature: 37
pCO2 arterial: 36 mmHg (ref 32.0–48.0)
pH, Arterial: 7.37 (ref 7.350–7.450)
pO2, Arterial: 78 mmHg — ABNORMAL LOW (ref 83.0–108.0)

## 2019-03-07 LAB — CBC
HCT: 35.4 % — ABNORMAL LOW (ref 39.0–52.0)
Hemoglobin: 11.7 g/dL — ABNORMAL LOW (ref 13.0–17.0)
MCH: 27.5 pg (ref 26.0–34.0)
MCHC: 33.1 g/dL (ref 30.0–36.0)
MCV: 83.1 fL (ref 80.0–100.0)
Platelets: 381 10*3/uL (ref 150–400)
RBC: 4.26 MIL/uL (ref 4.22–5.81)
RDW: 16.1 % — ABNORMAL HIGH (ref 11.5–15.5)
WBC: 36.9 10*3/uL — ABNORMAL HIGH (ref 4.0–10.5)
nRBC: 0 % (ref 0.0–0.2)

## 2019-03-07 LAB — MAGNESIUM: Magnesium: 2.2 mg/dL (ref 1.7–2.4)

## 2019-03-07 LAB — PHOSPHORUS: Phosphorus: 4.3 mg/dL (ref 2.5–4.6)

## 2019-03-07 LAB — GLUCOSE, CAPILLARY
Glucose-Capillary: 227 mg/dL — ABNORMAL HIGH (ref 70–99)
Glucose-Capillary: 259 mg/dL — ABNORMAL HIGH (ref 70–99)
Glucose-Capillary: 238 mg/dL — ABNORMAL HIGH (ref 70–99)
Glucose-Capillary: 295 mg/dL — ABNORMAL HIGH (ref 70–99)
Glucose-Capillary: 189 mg/dL — ABNORMAL HIGH (ref 70–99)

## 2019-03-07 MED ORDER — SODIUM CHLORIDE 0.9 % IV SOLN
1.0000 g | INTRAVENOUS | Status: DC
  Administered 2019-03-07: 1 g via INTRAVENOUS
  Filled 2019-03-07 (×2): qty 1

## 2019-03-07 MED ORDER — HYDROCORTISONE NA SUCCINATE PF 100 MG IJ SOLR
50.0000 mg | Freq: Four times a day (QID) | INTRAMUSCULAR | Status: DC
  Administered 2019-03-07 – 2019-03-12 (×20): 50 mg via INTRAVENOUS
  Filled 2019-03-07 (×20): qty 2

## 2019-03-07 MED ORDER — VANCOMYCIN HCL IN DEXTROSE 1-5 GM/200ML-% IV SOLN
1000.0000 mg | Freq: Once | INTRAVENOUS | Status: DC
  Filled 2019-03-07: qty 200

## 2019-03-07 MED ORDER — SODIUM CHLORIDE (PF) 0.9 % IJ SOLN
Freq: Once | INTRAMUSCULAR | Status: AC
  Administered 2019-03-07: 09:00:00 via INTRAPLEURAL
  Filled 2019-03-07: qty 10

## 2019-03-07 MED ORDER — INSULIN ASPART 100 UNIT/ML ~~LOC~~ SOLN
0.0000 [IU] | Freq: Three times a day (TID) | SUBCUTANEOUS | Status: DC
  Administered 2019-03-08: 7 [IU] via SUBCUTANEOUS
  Administered 2019-03-08: 2 [IU] via SUBCUTANEOUS
  Administered 2019-03-08: 3 [IU] via SUBCUTANEOUS
  Administered 2019-03-08 – 2019-03-09 (×2): 7 [IU] via SUBCUTANEOUS
  Administered 2019-03-09: 5 [IU] via SUBCUTANEOUS
  Administered 2019-03-09: 9 [IU] via SUBCUTANEOUS
  Administered 2019-03-09: 5 [IU] via SUBCUTANEOUS
  Administered 2019-03-10: 9 [IU] via SUBCUTANEOUS
  Administered 2019-03-10: 12:00:00 7 [IU] via SUBCUTANEOUS
  Administered 2019-03-10: 5 [IU] via SUBCUTANEOUS
  Administered 2019-03-10 – 2019-03-11 (×2): 9 [IU] via SUBCUTANEOUS
  Administered 2019-03-11 (×2): 7 [IU] via SUBCUTANEOUS
  Administered 2019-03-11 – 2019-03-12 (×2): 9 [IU] via SUBCUTANEOUS
  Administered 2019-03-12: 5 [IU] via SUBCUTANEOUS
  Administered 2019-03-12 (×2): 7 [IU] via SUBCUTANEOUS
  Administered 2019-03-13 (×4): 3 [IU] via SUBCUTANEOUS
  Administered 2019-03-14 (×2): 2 [IU] via SUBCUTANEOUS
  Administered 2019-03-14: 5 [IU] via SUBCUTANEOUS
  Administered 2019-03-14: 3 [IU] via SUBCUTANEOUS
  Administered 2019-03-15: 5 [IU] via SUBCUTANEOUS
  Administered 2019-03-15 (×3): 2 [IU] via SUBCUTANEOUS
  Administered 2019-03-16: 3 [IU] via SUBCUTANEOUS
  Administered 2019-03-16 (×3): 2 [IU] via SUBCUTANEOUS
  Administered 2019-03-17: 18:00:00 1 [IU] via SUBCUTANEOUS
  Administered 2019-03-17: 2 [IU] via SUBCUTANEOUS
  Administered 2019-03-17 (×2): 1 [IU] via SUBCUTANEOUS
  Administered 2019-03-18 (×2): 2 [IU] via SUBCUTANEOUS
  Administered 2019-03-18: 1 [IU] via SUBCUTANEOUS
  Administered 2019-03-18: 2 [IU] via SUBCUTANEOUS
  Filled 2019-03-07 (×45): qty 1

## 2019-03-07 NOTE — Progress Notes (Signed)
[facility]  ROUNDING NOTE   Subjective:   Currently on [place] 08/05 0701 - 08/06 0700 In: 804.2 [I.V.:401.6; IV Piggyback:402.5] Out: 2018 [Urine:900; Chest Tube:850] Remains critically ill, requiring pressors UOP has improved Has not had oral intake yet    Objective:  Vital signs in last 24 hours:  Temp:  [97.4 F (36.3 C)-99.3 F (37.4 C)] 97.6 F (36.4 C) (08/06 0800) Pulse Rate:  [75-106] 88 (08/06 0850) Resp:  [10-38] 18 (08/06 0850) BP: (78-117)/(52-82) 104/61 (08/06 0815) SpO2:  [90 %-97 %] 95 % (08/06 0850) FiO2 (%):  [30 %] 30 % (08/06 0600) Weight:  [129.9 kg] 129.9 kg (08/05 1549)  Weight change: -2.2 kg Filed Weights   03/05/19 1730 03/06/19 1545 03/06/19 1549  Weight: 132.1 kg 129.9 kg 129.9 kg    Intake/Output: I/O last 3 completed shifts: In: 1036.7 [I.V.:634.2; IV Piggyback:402.5] Out: 2218 [Urine:1100; HMCNO:709; Chest Tube:850]   Intake/Output this shift:  Total I/O In: 32.9 [I.V.:32.9] Out: -   Physical Exam: General: Critically ill  Head: Normocephalic, atraumatic. Moist oral mucosal membranes  Eyes: Anicteric,    Neck: Supple   Lungs:  Bilateral crackles, right sided rhonchi, diminished bilaterally, 2L [place]  Heart: irregular  Abdomen:  Soft, nontender, obese  Extremities:  trace peripheral edema.  Neurologic: Nonfocal, moving all four extremities  Skin: No lesions  Access: Right IJ temp HD catheter 8/4  Right chest tube in place  Basic Metabolic Panel: Recent Labs  Lab 03/04/19 1449 03/04/19 2109 03/05/19 0526 03/05/19 1536 03/06/19 0437 03/07/19 0607  NA 131*  --  131*  --  135 138  K 4.4  --  4.4  --  4.7 4.4  CL 96*  --  96*  --  100 101  CO2 17*  --  17*  --  19* 20*  GLUCOSE 199*  --  224*  --  197* 260*  BUN 72*  --  91*  --  97* 100*  CREATININE 3.94* 4.38* 4.82*  --  5.19* 4.94*  CALCIUM 7.6*  --  8.0*  --  7.9* 7.8*  PHOS  --   --   --  7.9* 8.3* 6.9*    Liver Function Tests: Recent Labs  Lab  03/06/19 0437 03/07/19 0607  ALBUMIN 2.3* 1.9*   No results for input(s): LIPASE, AMYLASE in the last 168 hours. No results for input(s): AMMONIA in the last 168 hours.  CBC: Recent Labs  Lab 03/04/19 1414 03/05/19 0526 03/06/19 0437 03/07/19 0607  WBC 31.2* 39.0* 40.0* 36.9*  NEUTROABS 27.8*  --   --   --   HGB 12.1* 11.8* 12.1* 11.7*  HCT 37.0* 35.9* 37.0* 35.4*  MCV 85.1 85.3 83.7 83.1  PLT 331 364 443* 381    Cardiac Enzymes: No results for input(s): CKTOTAL, CKMB, CKMBINDEX, TROPONINI in the last 168 hours.  BNP: Invalid input(s): POCBNP  CBG: Recent Labs  Lab 03/06/19 1550 03/06/19 1933 03/06/19 2336 03/07/19 0351 03/07/19 0730  GLUCAP 234* 189* 240* 55* 67*    Microbiology: Results for orders placed or performed during the hospital encounter of 03/04/19  Urine culture     Status: Abnormal   Collection Time: 03/04/19  2:14 PM   Specimen: Urine, Clean Catch  Result Value Ref Range Status   Specimen Description   Final    URINE, CLEAN CATCH Performed at [facility], [address] [place], [place] [address]    Special Requests   Final    NONE  Performed at [facility], [address] [place], [place] [address]    Culture (A)  Final    <10,000 COLONIES/mL INSIGNIFICANT GROWTH Performed at [facility] [address], [place], [place] [address]    Report Status 03/06/2019 FINAL  Final  SARS Coronavirus 2 [facility] order, Performed in [facility] hospital lab) Nasopharyngeal Nasopharyngeal Swab     Status: None   Collection Time: 03/04/19  2:17 PM   Specimen: Nasopharyngeal Swab  Result Value Ref Range Status   SARS Coronavirus 2 NEGATIVE NEGATIVE Final    Comment: (NOTE) If result is NEGATIVE SARS-CoV-2 target nucleic acids are NOT DETECTED. The SARS-CoV-2 RNA is generally detectable in upper and lower  respiratory specimens during the acute phase of infection. The lowest  concentration of SARS-CoV-2 viral copies  this assay can detect is 250  copies / mL. A negative result does not preclude SARS-CoV-2 infection  and should not be used as the sole basis for treatment or other  patient management decisions.  A negative result may occur with  improper specimen collection / handling, submission of specimen other  than nasopharyngeal swab, presence of viral mutation(s) within the  areas targeted by this assay, and inadequate number of viral copies  (<250 copies / mL). A negative result must be combined with clinical  observations, patient history, and epidemiological information. If result is POSITIVE SARS-CoV-2 target nucleic acids are DETECTED. The SARS-CoV-2 RNA is generally detectable in upper and lower  respiratory specimens dur ing the acute phase of infection.  Positive  results are indicative of active infection with SARS-CoV-2.  Clinical  correlation with patient history and other diagnostic information is  necessary to determine patient infection status.  Positive results do  not rule out bacterial infection or co-infection with other viruses. If result is PRESUMPTIVE POSTIVE SARS-CoV-2 nucleic acids MAY BE PRESENT.   A presumptive positive result was obtained on the submitted specimen  and confirmed on repeat testing.  While 2019 novel coronavirus  (SARS-CoV-2) nucleic acids may be present in the submitted sample  additional confirmatory testing may be necessary for epidemiological  and / or clinical management purposes  to differentiate between  SARS-CoV-2 and other Sarbecovirus currently known to infect humans.  If clinically indicated additional testing with an alternate test  methodology [phone]) is advised. The SARS-CoV-2 RNA is generally  detectable in upper and lower respiratory sp ecimens during the acute  phase of infection. The expected result is Negative. Fact Sheet for Patients:  StrictlyIdeas.no Fact Sheet for Healthcare  Providers: BankingDealers.co.za This test is not yet approved or cleared by the [place] FDA and has been authorized for detection and/or diagnosis of SARS-CoV-2 by FDA under an Emergency Use Authorization (EUA).  This EUA will remain in effect (meaning this test can be used) for the duration of the COVID-19 declaration under Section 564(b)(1) of the Act, 21 U.S.C. section 360bbb-3(b)(1), unless the authorization is terminated or revoked sooner. Performed at [facility], [address] [place], [place] [address]   Blood Culture (routine x 2)     Status: None (Preliminary result)   Collection Time: 03/04/19  2:55 PM   Specimen: BLOOD  Result Value Ref Range Status   Specimen Description BLOOD BLOOD RIGHT HAND  Final   Special Requests   Final    BOTTLES DRAWN AEROBIC AND ANAEROBIC Blood Culture adequate volume   Culture   Final    NO GROWTH 3 DAYS Performed at [facility],  [place], [place] [address]    Report Status PENDING  Incomplete  MRSA PCR Screening     Status: None   Collection Time: 03/04/19  8:36 PM   Specimen: Nasopharyngeal  Result Value Ref Range Status   MRSA by PCR NEGATIVE NEGATIVE Final    Comment:        The GeneXpert MRSA Assay (FDA approved for NASAL specimens only), is one component of a comprehensive MRSA colonization surveillance program. It is not intended to diagnose MRSA infection nor to guide or monitor treatment for MRSA infections. Performed at [facility], [address] [place], [place] [address]   Blood Culture (routine x 2)     Status: None (Preliminary result)   Collection Time: 03/04/19 10:01 PM   Specimen: BLOOD  Result Value Ref Range Status   Specimen Description BLOOD RIGHT ASSIST CONTROL  Final   Special Requests   Final    BOTTLES DRAWN AEROBIC AND ANAEROBIC Blood Culture adequate volume   Culture   Final    NO GROWTH 3 DAYS Performed at [facility], [address], [place], [place] [address]    Report Status PENDING  Incomplete  Body fluid culture     Status: None (Preliminary result)   Collection Time: 03/05/19 11:21 AM   Specimen: [facility] Cytology Pleural fluid  Result Value Ref Range Status   Specimen Description   Final    PLEURAL Performed at [facility], [address] [place], [place] [address]    Special Requests   Final    NONE Performed at [facility], [address] [place], [place] [address]    Gram Stain   Final    Emmit [place] NEGATIVE RODS ABUNDANT GRAM POSITIVE COCCI FEW GRAM VARIABLE ROD    Culture   Final    NO GROWTH < 24 HOURS Performed at [facility], [place] [address] [place], [place] [address]    Report Status PENDING  Incomplete    Coagulation Studies: No results for input(s): LABPROT, INR in the last 72 hours.  Urinalysis: Recent Labs    03/04/19 1414  COLORURINE AMBER*  LABSPEC 1.016  PHURINE 5.0  GLUCOSEU >=500*  HGBUR MODERATE*  BILIRUBINUR NEGATIVE  KETONESUR NEGATIVE  PROTEINUR NEGATIVE  NITRITE NEGATIVE  LEUKOCYTESUR NEGATIVE      Imaging: Ct Chest Wo Contrast  Result Date: 03/06/2019 CLINICAL DATA:  Shortness of breath. Pneumonia. Evaluate for empyema and potential for chest tube. EXAM: CT CHEST WITHOUT CONTRAST TECHNIQUE: Multidetector CT imaging of the chest was performed following the standard protocol without IV contrast. COMPARISON:  Chest radiograph 03/05/2019 FINDINGS: Cardiovascular: Heart is enlarged with coronary artery calcifications. Patient has a cardiac ICD with leads in the right atrium and right ventricle. Small amount of pericardial fluid. Mild aneurysmal enlargement of the ascending thoracic aorta measuring up to 4.1 cm. Right jugular central line with the tip near the SVC and right atrium junction. Mediastinum/Nodes: Prominent right infrahilar tissue measuring approximately 1.4 cm in short axis on sequence 2 image 79. No  significant left hilar lymphadenopathy. Small mediastinal lymph nodes particularly in the upper mediastinum along the right side of the trachea and esophagus. Lungs/Pleura: Complex small to moderate-sized right pleural effusion along the posterior right chest containing multiple pockets of gas. Findings are suggestive for an empyema. No left pleural fluid. Complete consolidation and volume loss in the right lower lobe. There is no significant aeration in the right lower lobe bronchus and  endobronchial lesion cannot be excluded in the right lower lobe. Pleural thickening or fluid along the right minor fissure. Mild interstitial thickening in the anterior right upper lobe. There is some volume loss in the right middle lobe. No significant airspace disease or consolidation in left lung. Motion artifact limits evaluation of the lungs. Upper Abdomen: Trace perihepatic ascites on sequence 2, image 179. High-density material in the gallbladder could represent stones or sludge. Non significant gallbladder distention or inflammatory changes. Small nodule in the anterior left upper quadrant sequence 2, image 142 is nonspecific could represent a splenule, granuloma or lymph node. Overall, no significant lymph node enlargement in the upper abdomen. Musculoskeletal: No acute bone abnormality. IMPRESSION: 1. Complex right pleural effusion with multiple pockets of gas. Findings are compatible with an empyema. 2. Complete consolidation and collapse of the right lower lobe. Findings could be secondary to pneumonia but an underlying endobronchial lesion and neoplasm cannot be excluded. 3. Prominent right infrahilar lymph node enlargement could be reactive but an underlying neoplastic process cannot be excluded. Recommend follow-up imaging after resolution of the empyema. 4. Trace perihepatic ascites. 5. 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 Recommend annual imaging followup by CTA or MRA. This  recommendation follows 2010 ACCF/AHA/AATS/ACR/ASA/SCA/SCAI/SIR/STS/SVM Guidelines for the Diagnosis and Management of Patients with Thoracic Aortic Disease. Circulation. 2010; 121: O160-V371. Aortic aneurysm NOS (ICD10-I71.9) Electronically Signed   By: Markus Daft M.D.   On: 03/06/2019 15:37   US Renal  Result Date: 03/05/2019 CLINICAL DATA:  68 year old male with acute renal failure. EXAM: RENAL / URINARY TRACT ULTRASOUND COMPLETE COMPARISON:  Report of CT Abdomen and Pelvis 12/03/2002 (no images available). FINDINGS: Right Kidney: Renal measurements: 12.0 x 5.9 x 5.7 centimeters = volume: 210 mL . Echogenicity within normal limits. No mass or hydronephrosis visualized. Left Kidney: Renal measurements: 11.5 x 6.6 x 5.9 centimeters = volume: 233 mL. Echogenicity within normal limits. No mass or hydronephrosis visualized. Bladder: Faintly visible Foley catheter balloon, the urinary bladder appears decompressed (image 29). Other findings: Shadowing echogenic gallstones (image 16). IMPRESSION: 1. No acute renal findings.  Bladder decompressed by Foley catheter. 2. Cholelithiasis. Electronically Signed   By: Genevie Ann M.D.   On: 03/05/2019 11:49   Dg Chest Port 1 View  Result Date: 03/05/2019 CLINICAL DATA:  Central line placement. EXAM: PORTABLE CHEST 1 VIEW 2:37 p.m. COMPARISON:  03/05/2019 at 11:12 a.m. FINDINGS: Double lumen central venous catheter is been inserted. The tip is at the cavoatrial junction in good position. No pneumothorax. Moderate to large right pleural effusion, unchanged. Possible infiltrate or atelectasis at the right lung base. Cardiomegaly. Slight pulmonary vascular prominence. No discrete infiltrates or effusions on the left. No acute bone abnormality. AICD in place. IMPRESSION: 1. Central venous catheter in good position. No pneumothorax. 2. Persistent moderate to large right pleural effusion. Possible atelectasis or infiltrate at the right base. Electronically Signed   By: Lorriane Shire  M.D.   On: 03/05/2019 15:04   Dg Chest Port 1 View  Result Date: 03/05/2019 CLINICAL DATA:  Pleural effusion. EXAM: PORTABLE CHEST 1 VIEW COMPARISON:  Same day chest ultrasound 03/05/2019, same-day chest radiograph 03/05/2019. FINDINGS: Lower chest incompletely imaged. Redemonstrated left chest AICD device. Cardiomegaly. Persistent gradient opacity at the right lung base consistent with residual pleural effusion. Additional right basilar opacity compatible with atelectasis and/or pneumonia. No focal consolidation within the left lung. No evidence of pneumothorax. IMPRESSION: Atelectasis and/or pneumonia at the right lung base with residual right pleural  effusion. Cardiomegaly. Electronically Signed   By: [name]   On: 03/05/2019 11:39   Ct Image Guided Drainage By Percutaneous Catheter  Result Date: 03/06/2019 INDICATION: 68 year old with right lung empyema. EXAM: CT-GUIDED RIGHT CHEST TUBE PLACEMENT MEDICATIONS: None ANESTHESIA/SEDATION: None COMPLICATIONS: None immediate. TECHNIQUE: Informed consent was obtained from the patient after a thorough discussion of the procedural risks, benefits and alternatives. All questions were addressed. A timeout was performed prior to the initiation of the procedure. PROCEDURE: CT images through the chest were obtained. The right mid axillary region was prepped with chlorhexidine and sterile field was created. Skin and soft tissues were anesthetized with 1% lidocaine. 18 gauge trocar needle was directed into the right pleural space using CT guidance. Pleural fluid was aspirated. A stiff Amplatz wire was advanced into the pleural space and the tract was dilated to accommodate a 14 [place] multipurpose drain. Purulent foul-smelling fluid was aspirated from the pleural space. Tube was attached to a PleurEvac chest drain system. Greater than 300 mL of fluid was removed by the end of the procedure. Catheter was sutured to skin and a dressing was placed. FINDINGS: Complex  right pleural fluid effusion with multiple gas pockets. Findings compatible with empyema. Greater than 300 mL of foul-smelling fluid was removed from the right pleural space. IMPRESSION: CT-guided placement of right chest tube for empyema. Electronically Signed   By: Markus Daft M.D.   On: 03/06/2019 14:36   US Thoracentesis Asp Pleural Space W/img Guide  Result Date: 03/05/2019 INDICATION: Patient with history of CHF, a. flutter s/p cardioversion who [facility] ED on 03/04/19 with worsening dyspnea. Imaging shows left lower lobe pneumonia and right sided pleural effusion. Request has been made to IR for diagnostic thoracentesis at bedside due to patient being on BiPAP currently. EXAM: ULTRASOUND GUIDED RIGHT THORACENTESIS MEDICATIONS: 10 mL 1% lidocaine COMPLICATIONS: None immediate. PROCEDURE: An ultrasound guided thoracentesis was thoroughly discussed with the patient and questions answered. The benefits, risks, alternatives and complications were also discussed. The patient understands and wishes to proceed with the procedure. Written consent was obtained. Ultrasound was performed to localize and mark an adequate pocket of fluid in the right chest. The area was then prepped and draped in the normal sterile fashion. 1% Lidocaine was used for local anesthesia. Under ultrasound guidance a 6 Fr Safe-T-Centesis catheter was introduced. Thoracentesis was performed. The catheter was removed and a dressing applied. FINDINGS: A total of approximately 200 mL of hazy yellow fluid was removed. Samples were sent to the laboratory as requested by the clinical team. IMPRESSION: Successful ultrasound guided right thoracentesis yielding 200 mL of pleural fluid. Read by Candiss Norse, PA-C Electronically Signed   By: Markus Daft M.D.   On: 03/05/2019 11:20     Medications:   . sodium chloride Stopped (03/06/19 1411)  . ceFEPime (MAXIPIME) IV 1 g (03/06/19 1718)  . norepinephrine (LEVOPHED) Adult infusion 3  mcg/min (03/07/19 0823)   . aspirin EC  81 mg Oral Daily  . atorvastatin  80 mg Oral Daily  . budesonide (PULMICORT) nebulizer solution  0.25 mg Nebulization BID  . Chlorhexidine Gluconate Cloth  6 each Topical Q0600  . heparin  5,000 Units Subcutaneous Q8H  . insulin aspart  0-9 Units Subcutaneous Q4H  . ipratropium-albuterol  3 mL Nebulization Q4H  . methylPREDNISolone (SOLU-MEDROL) injection  40 mg Intravenous Q12H  . niacin  1,000 mg Oral QHS  . pantoprazole (PROTONIX) IV  40 mg Intravenous Daily   sodium chloride, acetaminophen **  OR** acetaminophen, albuterol, nitroGLYCERIN, ondansetron **OR** ondansetron (ZOFRAN) IV, polyethylene glycol  Assessment/ Plan:  Mr. RANDALE CARVALHO is a 68 y.o. white male with diabetes mellitus type II, hypertension, obstructive sleep apnea, congestive heart failure, atrial fibrillation, hyperlipidemia, Crohn's disease, coronary artery disease, GERD, pacemaker, morbid obesity,  , who was admitted to [facility] on acute exacerbation of COPD, acute exacerbation of congestive heart failure, right pleural effusion, acute respiratory failure requiring BIPAP, sepsis with pneumonia and acute renal failure requiring hemodialysis   1. Acute renal failure: baseline creatinine of 1.1, normal GFR on 01/02/2019.  wiith underlying diabetic nephropathy with glucosuria.  - UOP now 900 cc yesterday - Plan on HD today. Minimal UF as UOP has improved significantly  - foley for urine measurements, may be removed from nephrology stand point  2. Acute respiratory failure: requiring noninvasive mechanical ventilation.  Secondary to pneumonia, congestive heart failure and COPD exacerbation And now has chest tube placed via CT guidance on 03/06/2019 Requiring O2 supplementation with [place]   3. Acute exacerbation of systolic congestive heart failure:  7/10 echocardiogram 35-40% Volume status is acceptable    4. Sepsis with hypotension: cultures pending. G pos and G Neg organsims    Requiring vasopressors. - Empiric cefepime.  - Currently on norepinephrine which is being weaned off   LOS: 3 Colton Lamb 8/6/202010:11 AM

## 2019-03-07 NOTE — Progress Notes (Signed)
Pre HD:     03/07/19 1600  Vital Signs  Pulse Rate (!) 101  Resp (!) 31  BP 102/66  Oxygen Therapy  SpO2 92 %  O2 Device Nasal Cannula  O2 Flow Rate (L/min) 2 L/min  Patient Activity (if Appropriate) In bed  Pulse Oximetry Type Continuous  Pain Assessment  Pain Scale 0-10  Pain Score 0  Time-Out for Hemodialysis  What Procedure? HD   Pt Identifiers(min of two) First/Last Name;MRN/Account#;Pt's DOB(use if MRN/Acct# not available  Correct Site? Yes  Correct Side? Yes  Correct Procedure? Yes  Consents Verified? Yes  Rad Studies Available? N/A  Safety Precautions Reviewed? Yes  Engineer, civil (consulting) Number 3  Station Number  (ICU 07)  UF/Alarm Test Passed  Conductivity: Meter 13.8  Conductivity: Machine  14  pH 7.4  Reverse Osmosis WRO 1  Normal Saline Lot Number [id number]  Dialyzer Lot Number [id number]  Disposable Set Lot Number [id number]  Machine Temperature 98.6 F (37 C)  Musician and Audible Yes  Blood Lines Intact and Secured Yes  Pre Treatment Patient Checks  Vascular access used during treatment Catheter  HD catheter dressing before treatment WDL  Patient is receiving dialysis in a chair  (no)  Hepatitis B Surface Antigen Results Negative  Date Hepatitis B Surface Antigen Drawn 03/05/19  Isolation Initiated  (no)  Hepatitis B Surface Antibody  (<10)  Date Hepatitis B Surface Antibody Drawn 03/05/19  Hemodialysis Consent Verified Yes  Hemodialysis Standing Orders Initiated Yes  ECG (Telemetry) Monitor On Yes  Prime Ordered Normal Saline  Length of  DialysisTreatment -hour(s) 2.5 Hour(s)  Dialysis Treatment Comments  (Na 140)  Dialyzer Elisio 17H NR  Dialysate 3K;2.5 Ca  Dialysate Flow Ordered 500  Blood Flow Rate Ordered 250 mL/min  Ultrafiltration Goal 0.5 Liters  Dialysis Blood Pressure Support Ordered Normal Saline  Education / Care Plan  Dialysis Education Provided Yes  Documented Education in Care Plan Yes  Outpatient Plan of Care  Reviewed and on Chart Yes  Hemodialysis Catheter Right Internal jugular Triple lumen Temporary (Non-Tunneled)  Placement Date/Time: 03/05/19 1450   Placed prior to admission: No  Time Out: Correct procedure;Correct site;Correct patient  Maximum sterile barrier precautions: Large sterile sheet;Sterile gown;Cap;Hand hygiene;Sterile gloves;Mask  Site Prep: Chlorh...  Site Condition No complications  Blue Lumen Status Blood return noted  Red Lumen Status Blood return noted  Purple Lumen Status N/A  Dressing Type Biopatch;Occlusive  Dressing Status Intact;Dry  Drainage Description None  Dressing Change Due 03/12/19

## 2019-03-07 NOTE — Progress Notes (Addendum)
Pharmacy Antibiotic Note  Colton Lamb is a 68 y.o. male admitted on 03/04/2019 with SOB treating for PNA. CT chest showed complex right pleural effusion with gas, compatible with an empyema. Patient underwent right chest tube placement, which removed greater than 300 ml of cloudy yellow foul-smelling fluid. Currently receiving cefepime. Pharmacy has been consulted for vancomycin dosing.  Patient received emergent HD yesterday 8/4. Current plan is to assess patient for need for HD on a daily basis.  Plan: Plan for additional HD today. Will give vancomycin 1 g IV x 1 to be given over the last hour of dialysis. Will check random level with morning labs.  Continue cefepime 1 g IV q24h to be given in the evening after dialysis  Height: 6' (182.9 cm) Weight: 286 lb 6 oz (129.9 kg) IBW/kg (Calculated) : 77.6  Temp (24hrs), Avg:98.4 F (36.9 C), Min:97.4 F (36.3 C), Max:99.3 F (37.4 C)  Recent Labs  Lab 03/04/19 1414 03/04/19 1449 03/04/19 1629 03/04/19 2109 03/05/19 0526 03/06/19 0437 03/07/19 0607  WBC 31.2*  --   --   --  39.0* 40.0* 36.9*  CREATININE  --  3.94*  --  4.38* 4.82* 5.19* 4.94*  LATICACIDVEN  --   --  2.7* 2.3*  --   --   --     Estimated Creatinine Clearance: 20.2 mL/min (A) (by C-G formula based on SCr of 4.94 mg/dL (H)).    Allergies  Allergen Reactions  . Iodine Other (See Comments)    Shortness of breath, swelling and hives  . Shrimp [Shellfish Allergy] Other (See Comments)    SWELLING    HIVES    SHORTNESS OF BREATH  . Tetracycline Rash    Antimicrobials this admission: Cefepime 8/3 >> Vancomycin 8/5 >>  Dose adjustments this admission: NA  Microbiology results: 8/3 BCx: NG 3 days 8/3 UCx: insignificant growth  8/3 MRSA PCR: negative 8/4 Body fluid Cx: pending  Thank you for allowing pharmacy to be a part of this patient's care.  Tawnya Crook, PharmD Clinical Pharmacist 03/07/2019 2:24 PM

## 2019-03-07 NOTE — Progress Notes (Signed)
Name: Colton Lamb MRN: [id number] DOB: Dec 02, 1950    ADMISSION DATE:  03/04/2019  CHIEF COMPLAINT: Shortness of Breath  OVERNIGHT EVENTS: Pt placed Bipap overnight due to increased WOB, pt not compliant with CPAP at home normally.  He seems more lethargic this morning, unclear of how he arrived at the hospital. He is protecting his airway, SpO2 stable, able to follow commands, ABG revealed normal pCO2 with slightly reduced pO2 at 78. - UOP continues to slowly improve: 350 mL s/p HD treatment x 2, will have 3rd HD treatment 8/6 - CT drained 150 mL overnight  SIGNIFICANT EVENTS / STUDIES:  8/3 > CT head: Chronic ventriculomegaly without evidence of focal brain insult. No visible change since February 2018. This is felt most likely secondary to central atrophy. Normal pressure hydrocephalus is not excluded but not favored. 8/3 > Admitted to ICU requiring Bipap  8/3 > Levophed initiated for hypotension 8/4> Thoracentesis: 200 mL hazy yellow fluid sent to lab 8/4> Trialysis RIJ placed 8/4 > 1st HD treatment- no UF  8/5> 2nd HD treatment - no UF 8/5> CT placed to drain right pleural empyema 8/6> TPA instilled in CT  CULTURES: 8/3 > Blood & Urine cultures- pending, no growth 8/6 8/3 > COVID 19 - negative 8/3 > MRSA PCR- negative 8/3 > Procalcitonin - 27.61 ~ 31.81 ~ 27.22 8/4> Pleural fluid- LD: 5,463 & WBC: 17,693 / gram stain positive for abundant GNR & GPC  ANTIBIOTICS: 8/3 > Cefepime 8/3 > Metronidazole x 1 dose, d/c'd 8/3 8/3 > Vancomycin x 2 doses, d/c'd 8/3  HISTORY OF PRESENT ILLNESS:  68 yo male with a PMHx of combined CHF (EF 35-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 Per ED documentation [facility] reported the pt as hypoxic and somnolent requiring Cpap for transport.  Upon  arrival in ED pt was placed on Bipap with subsequent improving mental status. Pt denies fevers/chills, denies chest pain/palpitations, denies sputum production.  Significant diagnostics include: BNP- 972, Troponin- 56, Lactic- 2.7, serum bicarb- 17, AG- 18, BUN- 72, Cr.- 3.94, WBC- 31.2 & CXR revealing moderate right sided pleural effusion.  Pt received 2 L of IVF resuscitation and 20 mg of lasix. Pt admitted to ICU on Bipap, and was found to be hypotensive requiring pressor administration.  PAST MEDICAL HISTORY :   has a past medical history of Atrial flutter ([place]), Chronic systolic heart failure ([place]), Coronary artery disease, Crohn's ileocolitis ([place]), GERD (gastroesophageal reflux disease), adenomatous colonic polyps (11/2003), Hyperlipidemia, Hypertension, Ischemic cardiomyopathy, Obesity, Paroxysmal atrial fibrillation ([place]), Sleep apnea, Syncope, and Type II diabetes mellitus ([place]).  has a past surgical history that includes Ileocecal resection and sigmoid enterocolonic fistula repair (09/1998); Hydrocele surgery (Bilateral); Foot surgery (Left); Cardioversion (N/A, 03/05/2014); Cardiac defibrillator placement (04/16/2014); Cardiac electrophysiology study and ablation (04/16/2014); Coronary angioplasty with stent (2007; 2008 X 2); Cardiac catheterization (10/2013); left heart catheterization with coronary angiogram (N/A, 11/22/2013); Atrial flutter ablation (N/A, 04/16/2014); implantable cardioverter defibrillator implant (N/A, 04/16/2014); Cataract extraction w/PHACO (Right, 01/04/2017); and Cataract extraction w/PHACO (Left, 02/08/2017). Prior to Admission medications   Medication Sig Start Date End Date Taking? Authorizing Provider  acetaminophen (TYLENOL) 325 MG tablet Take 1-2 tablets (325-650 mg total) by mouth every 4 (four) hours as needed for mild pain. 04/17/14   [name],  NP  amiodarone (PACERONE) 200 MG tablet Take 1 tablet (200 mg total) once daily M-F and 0.5 tablet (100 mg total) Saturday and  Sunday. 01/10/19   Rise Mu, PA-C  aspirin 81 MG tablet Take 81 mg by mouth daily.     [provider]  atorvastatin (LIPITOR) 80 MG tablet TAKE 1 TABLET BY MOUTH EVERY DAY 12/11/18   Gollan, Kathlene November, MD  balsalazide (COLAZAL) 750 MG capsule TAKE 1 CAPSULE (750 MG TOTAL) BY MOUTH 3 (THREE) TIMES DAILY. 11/28/18   Ladene Artist, MD  Blood Glucose Monitoring Suppl ([place]) w/Device KIT See admin instructions. 01/08/19   [provider]  carvedilol (COREG) 12.5 MG tablet TAKE 1 TABLET (12.5 MG TOTAL) BY MOUTH 2 (TWO) TIMES DAILY. 02/11/19   Minna Merritts, MD  ENTRESTO 49-51 MG TAKE 1 TABLET BY MOUTH TWICE A DAY 05/16/18   Gollan, Kathlene November, MD  JARDIANCE 10 MG TABS tablet Take 10 mg by mouth daily. 01/04/19   [provider]  metFORMIN (GLUCOPHAGE) 1000 MG tablet Take 1,000 mg by mouth 2 (two) times daily with a meal.  06/05/15   [provider]  niacin (NIASPAN) 1000 MG CR tablet TAKE 1 TABLET BY MOUTH AT BEDTIME 07/20/18   Gollan, Kathlene November, MD  nitroGLYCERIN (NITROSTAT) 0.4 MG SL tablet Place 0.4 mg under the tongue every 5 (five) minutes as needed (MAX 3 TABLETS). FOR CHEST PAIN    [provider]  omeprazole (PRILOSEC) 20 MG capsule Take 20 mg by mouth daily.     [provider]  ONETOUCH VERIO test strip USE 1 EACH (1 STRIP TOTAL) 3 (THREE) TIMES DAILY USE AS INSTRUCTED. 01/09/19   [provider]  potassium chloride (K-DUR) 10 MEQ tablet TAKE 1 TABLET BY MOUTH EVERY DAY 04/23/18   Minna Merritts, MD  terbinafine (LAMISIL) 250 MG tablet Take 1 tablet (250 mg total) by mouth daily. 02/20/19   Hyatt, Max T, DPM  torsemide (DEMADEX) 20 MG tablet Take 2 tablets (40 mg total) by mouth as directed. Alternate 20 mg twice a day and 40 mg twice a day 04/13/18   Gollan, Kathlene November, MD  XARELTO 20 MG TABS tablet TAKE 1 TABLET BY MOUTH EVERY DAY WITH LUNCH 11/23/18   Minna Merritts, MD   Allergies  Allergen Reactions    Iodine Other (See Comments)    Shortness of breath, swelling and hives   Shrimp [Shellfish Allergy] Other (See Comments)    SWELLING    HIVES    SHORTNESS OF BREATH   Tetracycline Rash    REVIEW OF SYSTEMS:  Positives in BOLD Constitutional: Negative for fever, chills, weight loss, malaise/fatigue and diaphoresis.  HENT: Negative for hearing loss, ear pain, nosebleeds, congestion, sore throat, neck pain, tinnitus and ear discharge.   Eyes: Negative for blurred vision, double vision, photophobia, pain, discharge and redness.  Respiratory: Negative for cough, hemoptysis, sputum production, shortness of breath, wheezing and stridor.   Cardiovascular: Negative for chest pain, palpitations, orthopnea, claudication, leg swelling and PND.  Gastrointestinal: Negative for heartburn, nausea, vomiting, abdominal pain, diarrhea, constipation, blood in stool and melena.  Genitourinary: Negative for dysuria, urgency, frequency, hematuria and flank pain.  Musculoskeletal: Negative for myalgias, back pain, joint pain and falls.  Skin: Negative for itching and rash.  Neurological: Negative for dizziness, tingling, tremors, sensory change, speech change, focal weakness, seizures, loss of consciousness, weakness and headaches.  Endo/Heme/Allergies: Negative for environmental allergies and polydipsia. Does  not bruise/bleed easily.  VITAL SIGNS: Temp:  [97.4 F (36.3 C)-99.3 F (37.4 C)] 97.6 F (36.4 C) (08/06 0800) Pulse Rate:  [75-106] 88 (08/06 0850) Resp:  [10-38] 18 (08/06 0850) BP: (78-117)/(52-82) 104/61 (08/06 0815) SpO2:  [90 %-97 %] 95 % (08/06 0850) FiO2 (%):  [30 %] 30 % (08/06 0600) Weight:  [129.9 kg] 129.9 kg (08/05 1549)  PHYSICAL EXAMINATION: General:  Pt lying in bed, lethargic, ill appearing, no c/o pain Neuro:  A&O x 3, PERRLA, no focal/sensory deficits HEENT:  Atraumatic, normocephalic, no scleral icterus Cardiovascular:  S1, S2, distant sounds, irregular- Afib controlled, +2  palpable pulses, no JVD Lungs:  Rhonchi in BUL with diminished lung sounds auscultated in the BLL. no cough/sputum production, normal effort & rate on Bipap @ 30% Abdomen:  Soft, distended, non tender, active bowel sounds Musculoskeletal:  5/5 strength throughout, full movement/following commands in all extremitites Skin:  Warm, dry, BLE abrasions, non tenting, no rashes/ulcerations  Recent Labs  Lab 03/05/19 0526 03/06/19 0437 03/07/19 0607  NA 131* 135 138  K 4.4 4.7 4.4  CL 96* 100 101  CO2 17* 19* 20*  BUN 91* 97* 100*  CREATININE 4.82* 5.19* 4.94*  GLUCOSE 224* 197* 260*   Recent Labs  Lab 03/05/19 0526 03/06/19 0437 03/07/19 0607  HGB 11.8* 12.1* 11.7*  HCT 35.9* 37.0* 35.4*  WBC 39.0* 40.0* 36.9*  PLT 364 443* 381   Ct Chest Wo Contrast  Result Date: 03/06/2019 CLINICAL DATA:  Shortness of breath. Pneumonia. Evaluate for empyema and potential for chest tube. EXAM: CT CHEST WITHOUT CONTRAST TECHNIQUE: Multidetector CT imaging of the chest was performed following the standard protocol without IV contrast. COMPARISON:  Chest radiograph 03/05/2019 FINDINGS: Cardiovascular: Heart is enlarged with coronary artery calcifications. Patient has a cardiac ICD with leads in the right atrium and right ventricle. Small amount of pericardial fluid. Mild aneurysmal enlargement of the ascending thoracic aorta measuring up to 4.1 cm. Right jugular central line with the tip near the SVC and right atrium junction. Mediastinum/Nodes: Prominent right infrahilar tissue measuring approximately 1.4 cm in short axis on sequence 2 image 79. No significant left hilar lymphadenopathy. Small mediastinal lymph nodes particularly in the upper mediastinum along the right side of the trachea and esophagus. Lungs/Pleura: Complex small to moderate-sized right pleural effusion along the posterior right chest containing multiple pockets of gas. Findings are suggestive for an empyema. No left pleural fluid. Complete  consolidation and volume loss in the right lower lobe. There is no significant aeration in the right lower lobe bronchus and endobronchial lesion cannot be excluded in the right lower lobe. Pleural thickening or fluid along the right minor fissure. Mild interstitial thickening in the anterior right upper lobe. There is some volume loss in the right middle lobe. No significant airspace disease or consolidation in left lung. Motion artifact limits evaluation of the lungs. Upper Abdomen: Trace perihepatic ascites on sequence 2, image 179. High-density material in the gallbladder could represent stones or sludge. Non significant gallbladder distention or inflammatory changes. Small nodule in the anterior left upper quadrant sequence 2, image 142 is nonspecific could represent a splenule, granuloma or lymph node. Overall, no significant lymph node enlargement in the upper abdomen. Musculoskeletal: No acute bone abnormality. IMPRESSION: 1. Complex right pleural effusion with multiple pockets of gas. Findings are compatible with an empyema. 2. Complete consolidation and collapse of the right lower lobe. Findings could be secondary to pneumonia but an underlying endobronchial lesion and neoplasm cannot be  excluded. 3. Prominent right infrahilar lymph node enlargement could be reactive but an underlying neoplastic process cannot be excluded. Recommend follow-up imaging after resolution of the empyema. 4. Trace perihepatic ascites. 5. 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 Recommend annual imaging followup by CTA or MRA. This recommendation follows 2010 ACCF/AHA/AATS/ACR/ASA/SCA/SCAI/SIR/STS/SVM Guidelines for the Diagnosis and Management of Patients with Thoracic Aortic Disease. Circulation. 2010; 121: Z610-R604. Aortic aneurysm NOS (ICD10-I71.9) Electronically Signed   By: Markus Daft M.D.   On: 03/06/2019 15:37   US Renal  Result Date: 03/05/2019 CLINICAL DATA:  68 year old male with acute renal  failure. EXAM: RENAL / URINARY TRACT ULTRASOUND COMPLETE COMPARISON:  Report of CT Abdomen and Pelvis 12/03/2002 (no images available). FINDINGS: Right Kidney: Renal measurements: 12.0 x 5.9 x 5.7 centimeters = volume: 210 mL . Echogenicity within normal limits. No mass or hydronephrosis visualized. Left Kidney: Renal measurements: 11.5 x 6.6 x 5.9 centimeters = volume: 233 mL. Echogenicity within normal limits. No mass or hydronephrosis visualized. Bladder: Faintly visible Foley catheter balloon, the urinary bladder appears decompressed (image 29). Other findings: Shadowing echogenic gallstones (image 16). IMPRESSION: 1. No acute renal findings.  Bladder decompressed by Foley catheter. 2. Cholelithiasis. Electronically Signed   By: Genevie Ann M.D.   On: 03/05/2019 11:49   Dg Chest Port 1 View  Result Date: 03/05/2019 CLINICAL DATA:  Central line placement. EXAM: PORTABLE CHEST 1 VIEW 2:37 p.m. COMPARISON:  03/05/2019 at 11:12 a.m. FINDINGS: Double lumen central venous catheter is been inserted. The tip is at the cavoatrial junction in good position. No pneumothorax. Moderate to large right pleural effusion, unchanged. Possible infiltrate or atelectasis at the right lung base. Cardiomegaly. Slight pulmonary vascular prominence. No discrete infiltrates or effusions on the left. No acute bone abnormality. AICD in place. IMPRESSION: 1. Central venous catheter in good position. No pneumothorax. 2. Persistent moderate to large right pleural effusion. Possible atelectasis or infiltrate at the right base. Electronically Signed   By: Lorriane Shire M.D.   On: 03/05/2019 15:04   Dg Chest Port 1 View  Result Date: 03/05/2019 CLINICAL DATA:  Pleural effusion. EXAM: PORTABLE CHEST 1 VIEW COMPARISON:  Same day chest ultrasound 03/05/2019, same-day chest radiograph 03/05/2019. FINDINGS: Lower chest incompletely imaged. Redemonstrated left chest AICD device. Cardiomegaly. Persistent gradient opacity at the right lung base  consistent with residual pleural effusion. Additional right basilar opacity compatible with atelectasis and/or pneumonia. No focal consolidation within the left lung. No evidence of pneumothorax. IMPRESSION: Atelectasis and/or pneumonia at the right lung base with residual right pleural effusion. Cardiomegaly. Electronically Signed   By: [name]   On: 03/05/2019 11:39   Ct Image Guided Drainage By Percutaneous Catheter  Result Date: 03/06/2019 INDICATION: 68 year old with right lung empyema. EXAM: CT-GUIDED RIGHT CHEST TUBE PLACEMENT MEDICATIONS: None ANESTHESIA/SEDATION: None COMPLICATIONS: None immediate. TECHNIQUE: Informed consent was obtained from the patient after a thorough discussion of the procedural risks, benefits and alternatives. All questions were addressed. A timeout was performed prior to the initiation of the procedure. PROCEDURE: CT images through the chest were obtained. The right mid axillary region was prepped with chlorhexidine and sterile field was created. Skin and soft tissues were anesthetized with 1% lidocaine. 18 gauge trocar needle was directed into the right pleural space using CT guidance. Pleural fluid was aspirated. A stiff Amplatz wire was advanced into the pleural space and the tract was dilated to accommodate a 14 [place] multipurpose drain. Purulent foul-smelling fluid was aspirated from the pleural  space. Tube was attached to a PleurEvac chest drain system. Greater than 300 mL of fluid was removed by the end of the procedure. Catheter was sutured to skin and a dressing was placed. FINDINGS: Complex right pleural fluid effusion with multiple gas pockets. Findings compatible with empyema. Greater than 300 mL of foul-smelling fluid was removed from the right pleural space. IMPRESSION: CT-guided placement of right chest tube for empyema. Electronically Signed   By: Markus Daft M.D.   On: 03/06/2019 14:36   US Thoracentesis Asp Pleural Space W/img Guide  Result Date:  03/05/2019 INDICATION: Patient with history of CHF, a. flutter s/p cardioversion who presented to [facility] ED on 03/04/19 with worsening dyspnea. Imaging shows left lower lobe pneumonia and right sided pleural effusion. Request has been made to IR for diagnostic thoracentesis at bedside due to patient being on BiPAP currently. EXAM: ULTRASOUND GUIDED RIGHT THORACENTESIS MEDICATIONS: 10 mL 1% lidocaine COMPLICATIONS: None immediate. PROCEDURE: An ultrasound guided thoracentesis was thoroughly discussed with the patient and questions answered. The benefits, risks, alternatives and complications were also discussed. The patient understands and wishes to proceed with the procedure. Written consent was obtained. Ultrasound was performed to localize and mark an adequate pocket of fluid in the right chest. The area was then prepped and draped in the normal sterile fashion. 1% Lidocaine was used for local anesthesia. Under ultrasound guidance a 6 Fr Safe-T-Centesis catheter was introduced. Thoracentesis was performed. The catheter was removed and a dressing applied. FINDINGS: A total of approximately 200 mL of hazy yellow fluid was removed. Samples were sent to the laboratory as requested by the clinical team. IMPRESSION: Successful ultrasound guided right thoracentesis yielding 200 mL of pleural fluid. Read by Candiss Norse, PA-C Electronically Signed   By: Markus Daft M.D.   On: 03/05/2019 11:20      ASSESSMENT / PLAN:  Acute Respiratory Failure with hypoxia secondary to R sided pleural effusion CAP with empyema  Hx: Combined systolic & diastolic heart failure, OSA Increased lethargy this AM, requiring Bipap O/N, ABG reassuring - Supplemental oxygen PRN, maintain SpO2 > 92%, on Bipap overnight @ 30 % > while awake 2 L [place] - Continuous SpO2 monitoring - Continue scheduled pulmicort, duonebs & Albuterol PRN - CT to sxn, monitor for output and leak, intrapleural thrombolytics PRN per CTS  Sepsis with septic shock  secondary to pneumonia and empyema Improving: still requiring low dose pressors, WBC trending down, afebrile, CT draining empyema - monitor WBC and fever curve - BC & UC pending- no growth  - continuous cardiac monitoring - low dose levophed drip continued for persistent hypotension  - Stress dose steroids started - Continue cefepime per pharmacy consult  Elevated Troponin secondary to suspected demand ischemia  Hx: CAD, ICM s/p ICD, PAF, combined CHF Improved- AF controlled - Trend Troponin: 56 ~ 50 - continuous cardiac monitoring - no c/o CP & BLE edema resolved s/p HD  Acute Renal Failure secondary to hypovolemia vs septic shock Metabolic Acidosis- improving serum bicarb & AG - Strict I&O's - foley catheter, assess need daily - Daily BMP, avoid nephrotoxic agents - Continue daily assessment of need for dialysis -Nephrology consult, appreciate input    DISPOSITION:  ICU GOALS OF CARE:  FULL CODE VTE Prophylaxis: Heparin SQ Stress Ulcer Prophylaxis: Protonix IV    Critical Care Time devoted to patient care services described in this note is 34  minutes.   Overall, patient is critically ill, prognosis is guarded.     Lolita Rieger  Mortimer Fries, M.D.  [facility]  Medical Director [place] Director [facility] Cardio-Pulmonary Department

## 2019-03-07 NOTE — Progress Notes (Signed)
Pre HD Assessment:    03/07/19 1612  Neurological  Level of Consciousness Alert  Orientation Level Oriented X4  Respiratory  Respiratory Pattern Regular;Unlabored;Shallow  Chest Assessment Chest expansion symmetrical  Cardiac  Pulse Irregular  Heart Sounds S1, S2  Jugular Venous Distention (JVD) No  Vascular  R Radial Pulse +2  L Radial Pulse +2  Musculoskeletal  Musculoskeletal (WDL) X  Psychosocial  Psychosocial (WDL) WDL

## 2019-03-07 NOTE — Progress Notes (Signed)
Ch f/u with pt that had just finished his lunch to help lessen feeling of isolation. Pt was now off of his BiPaP but was still using O2. Ch sat bedside and encouraged storytelling about the things that the pt has accomplished in his past. 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 Pt shared hs hx of cardiac arrest over the years and how he has struggle with meaning making since the passing of his wife in Oct 19, 2011 who died of cancer. Pt reflected on the good times he had with his family and how he was thankful that his wife was not still living during this pandemic considering how weak she had become. Pt shared how challenging it was to let her go but was also aware of how aggressive the cancer had spread in his wife. [place] provided a compassionate presence as the pt shared that his mom is worrying about him. Pt shared that he has pneumonia yet is hopeful that he will not be on dialysis permanently.   Ch wanted to promote sense of peace for pt. Pt was thankful for visit.    03/07/19 1300  Clinical Encounter Type  Visited With Patient  Visit Type Psychological support;Spiritual support;Social support;Critical Care  Spiritual Encounters  Spiritual Needs Emotional;Grief support  Stress Factors  Patient Stress Factors Exhausted;Health changes;Major life changes;Loss of control  Family Stress Factors None identified

## 2019-03-07 NOTE — Progress Notes (Signed)
HD Tx Initiated:   03/07/19 1612  Vital Signs  Temp 98.3 F (36.8 C)  Temp Source Oral  Pulse Rate (!) 107  Pulse Rate Source Monitor  Resp (!) 28  BP 102/66  BP Location Right Arm  BP Method Automatic  Patient Position (if appropriate) Lying  Oxygen Therapy  SpO2 93 %  O2 Device Nasal Cannula  O2 Flow Rate (L/min) 2 L/min  Patient Activity (if Appropriate) In bed  Pulse Oximetry Type Continuous  Pain Assessment  Pain Scale 0-10  Pain Score 0  During Hemodialysis Assessment  Blood Flow Rate (mL/min) 250 mL/min  Arterial Pressure (mmHg) -90 mmHg  Venous Pressure (mmHg) 40 mmHg  Transmembrane Pressure (mmHg) 60 mmHg  Ultrafiltration Rate (mL/min) 400 mL/min  Dialysate Flow Rate (mL/min) 500 ml/min  Conductivity: Machine  14  HD Safety Checks Performed Yes  Intra-Hemodialysis Comments Tx initiated

## 2019-03-07 NOTE — Progress Notes (Signed)
[facility] NAME: Colton Lamb    MR#:  [id number]  DATE OF BIRTH:  May 02, 1951  SUBJECTIVE:   Says he is feeling better..  Status post CT-guided chest tube placement for right lung empyema yesterday.  REVIEW OF SYSTEMS:  Review of Systems  Constitutional: Negative for chills and fever.  HENT: Negative for congestion and sore throat.   Eyes: Negative for blurred vision and double vision.  Respiratory: Positive for shortness of breath. Negative for cough.   Cardiovascular: Negative for chest pain and palpitations.  Gastrointestinal: Negative for nausea and vomiting.  Genitourinary: Negative for dysuria and urgency.  Musculoskeletal: Negative for back pain and neck pain.  Neurological: Negative for dizziness and headaches.  Psychiatric/Behavioral: Negative for depression. The patient is not nervous/anxious.     DRUG ALLERGIES:   Allergies  Allergen Reactions  . Iodine Other (See Comments)    Shortness of breath, swelling and hives  . Shrimp [Shellfish Allergy] Other (See Comments)    SWELLING    HIVES    SHORTNESS OF BREATH  . Tetracycline Rash   VITALS:  Blood pressure 94/65, pulse 87, temperature 97.6 F (36.4 C), temperature source Axillary, resp. rate (!) 27, height 6' (1.829 m), weight 129.9 kg, SpO2 90 %. PHYSICAL EXAMINATION:  Physical Exam  GENERAL:  Laying in the bed with no acute distress cooperative.Marland Kitchen  HEENT: Head atraumatic, normocephalic. Pupils equal, round, reactive to light and accommodation. No scleral icterus. Extraocular muscles intact. Oropharynx and nasopharynx clear.  NECK:  Supple, no jugular venous distention. No thyroid enlargement. LUNGS: + Diminished breath sounds in the right lower to mid lung.  Chest tube present in the right lung.  Rhonchi. CARDIOVASCULAR: Irregularly irregular rhythm, regular rate, S1, S2 normal. No murmurs, rubs, or gallops.  ABDOMEN: Soft, nontender, nondistended. Bowel  sounds present.  EXTREMITIES: No pedal edema, cyanosis, or clubbing.  NEUROLOGIC: CN 2-12 intact, no focal deficits. 5/5 muscle strength throughout all extremities. Sensation intact throughout. Gait not checked.  PSYCHIATRIC: The patient is alert and oriented x 3.  SKIN: No obvious rash, lesion, or ulcer.  LABORATORY PANEL:  Male CBC Recent Labs  Lab 03/07/19 0607  WBC 36.9*  HGB 11.7*  HCT 35.4*  PLT 381   ------------------------------------------------------------------------------------------------------------------ Chemistries  Recent Labs  Lab 03/07/19 0607  NA 138  K 4.4  CL 101  CO2 20*  GLUCOSE 260*  BUN 100*  CREATININE 4.94*  CALCIUM 7.8*   RADIOLOGY:  No results found. ASSESSMENT AND PLAN:   Septic shock secondary to pneumonia, empyema, status post chest tube in the right lung, continue IV antibiotics.,  Low-dose pressors, WBC trending down, afebrile, continue bronchodilators, stress dose steroids.,  WBC slowly down but still very high at 36.9.  Acute hypoxic respiratory failure secondary to acute on chronic systolic CHF, CAP, and right pleural effusion' status post chest tube insertion on the right side, status post TPA by CT surgeon Dr. Faith Rogue today. \   Acute renal failure, secondary to septic shock' does have metabolic acidosis, improving with bicarb, nephrology is following on daily need for dialysis.     Chronic atrial fibrillation ' restart Xarelto as soon as patient does not need any more procedures..history of CAD, ischemic cardiomyopathy, status post ICD, combined CHF Supposed to be on Entresto but due to hypotension requiring pressors not on Entresto.  on amiodarone at home.  -Diabetes mellitus type 2, patient is on Jardiance, metformin both are held now, continue sliding  scale insulin with coverage.   Prognosis poor, high risk for cardiac arrest secondary to multiple medical problems, advanced age.  All the records are reviewed and case  discussed with Care Management/Social Worker. Management plans discussed with the patient, family and they are in agreement.  CODE STATUS: Full Code  TOTAL TIME TAKING CARE OF THIS PATIENT: 40 minutes.   More than 50% of the time was spent in counseling/coordination of care: YES  POSSIBLE D/C unknown, DEPENDING ON CLINICAL CONDITION.   Epifanio Lesches M.D on 03/07/2019 at 2:28 PM  Between 7am to 6pm - Pager [phone]  After 6pm go to [email or web address] - Proofreader  [facility]  Office  [phone]  CC: Primary care physician; Toni Arthurs, NP  Note: This dictation was prepared with Dragon dictation along with smaller phrase technology. Any transcriptional errors that result from this process are unintentional.

## 2019-03-07 NOTE — Progress Notes (Signed)
Pt tolerated well the HD Tx.  Tx was completed at bedside in the ICU room. Pt received 250 ml NS post rinse infusion. Catheter fused well and locked with 1000 U of Heparin (1.4cc) for each of the red and the blue.   03/07/19 1848  Vital Signs  Temp 98.9 F (37.2 C)  Temp Source Oral  Pulse Rate (!) 108  Pulse Rate Source Monitor  Resp (!) 28  BP 109/64  Oxygen Therapy  SpO2 92 %  O2 Device Nasal Cannula  O2 Flow Rate (L/min) 2 L/min  During Hemodialysis Assessment  Blood Flow Rate (mL/min) 250 mL/min  Arterial Pressure (mmHg) -110 mmHg  Venous Pressure (mmHg) 110 mmHg  Transmembrane Pressure (mmHg) 40 mmHg  Ultrafiltration Rate (mL/min) 400 mL/min  Dialysate Flow Rate (mL/min) 500 ml/min  Conductivity: Machine  14  HD Safety Checks Performed Yes  Intra-Hemodialysis Comments Tx completed  Post-Hemodialysis Assessment  Rinseback Volume (mL) 250 mL  KECN 37.1 V  Dialyzer Clearance Lightly streaked  Duration of HD Treatment -hour(s) 2.5 hour(s)  Hemodialysis Intake (mL) 500 mL  UF Total -Machine (mL) 990 mL  Net UF (mL) 490 mL  Tolerated HD Treatment Yes  Hemodialysis Catheter Right Internal jugular Triple lumen Temporary (Non-Tunneled)  Placement Date/Time: 03/05/19 1450   Placed prior to admission: No  Time Out: Correct procedure;Correct site;Correct patient  Maximum sterile barrier precautions: Large sterile sheet;Sterile gown;Cap;Hand hygiene;Sterile gloves;Mask  Site Prep: Chlorh...  Site Condition No complications  Blue Lumen Status Heparin locked  Red Lumen Status Heparin locked  Purple Lumen Status N/A  Catheter fill solution Heparin 1000 units/ml  Catheter fill volume (Arterial) 1.4 cc  Catheter fill volume (Venous) 1.4  Dressing Type Biopatch;Occlusive  Dressing Status Intact;Dry  Drainage Description None  Post treatment catheter status Capped and Clamped

## 2019-03-07 NOTE — Progress Notes (Signed)
C/o  SOB placed on Bi-pap RR 30-36. Resting in bed without any distress.

## 2019-03-07 NOTE — Progress Notes (Signed)
Inpatient Diabetes Program Recommendations  AACE/ADA: New Consensus Statement on Inpatient Glycemic Control (2015)  Target Ranges:  Prepandial:   less than 140 mg/dL      Peak postprandial:   less than 180 mg/dL (1-2 hours)      Critically ill patients:  140 - 180 mg/dL   Results for COLA, HIGHFILL (MRN [id number]) as of 03/07/2019 10:43  Ref. Range 03/05/2019 23:51 03/06/2019 04:44 03/06/2019 07:40 03/06/2019 11:09 03/06/2019 13:45 03/06/2019 15:50 03/06/2019 19:33  Glucose-Capillary Latest Ref Range: 70 - 99 mg/dL 190 (H) 203 (H) 174 (H) 259 (H) 214 (H) 234 (H) 189 (H)   Results for ARJUNA, DOEDEN (MRN [id number]) as of 03/07/2019 10:43  Ref. Range 03/06/2019 23:36 03/07/2019 03:51 03/07/2019 07:30  Glucose-Capillary Latest Ref Range: 70 - 99 mg/dL 240 (H) 227 (H) 259 (H)    Home DM Meds: Jardiance 10 mg Daily       Metformin 1000 mg BID  Current Orders: Novolog Sensitive Correction Scale/ SSI (0-9 units) Q4 hours      MD- Note patient getting Solucortef 50 mg Q6 hours.  CBGs >200 mg/dl.  Home oral DM meds are on hold.  Please consider adding low dose basal insulin to in-hospital insulin regimen:  Lantus 10 units Daily (0.075 units/kg dosing)     --Will follow patient during hospitalization--  Wyn Quaker RN, MSN, CDE Diabetes Coordinator Inpatient Glycemic Control Team Team Pager: [phone] (8a-5p)

## 2019-03-07 NOTE — Progress Notes (Signed)
Patient ID: Colton Lamb, male   DOB: 01/09/1951, 67 y.o.   MRN: [id number]  Patient seen at bedside.  Sleepy but responds to stimuli.  BP about 939 systolic on Levophed  Lungs are distant bilaterally Heart regular Abdomen is soft and nontender  No air leak form chest tube. Drainage is purulent c/w empyema  I instilled 10 mg of TPA into the chest tube.  He tolerated that well  We will leave the chest tube clamped for 4 hours and then unclamp. We will continue to monitor chest xray and fluid drainage  Marta Lamas.

## 2019-03-08 ENCOUNTER — Inpatient Hospital Stay: Payer: Medicare Other

## 2019-03-08 ENCOUNTER — Other Ambulatory Visit: Payer: Self-pay | Admitting: Cardiovascular Disease

## 2019-03-08 DIAGNOSIS — A419 Sepsis, unspecified organism: Secondary | ICD-10-CM

## 2019-03-08 DIAGNOSIS — R6521 Severe sepsis with septic shock: Secondary | ICD-10-CM

## 2019-03-08 LAB — CBC
HCT: 34 % — ABNORMAL LOW (ref 39.0–52.0)
Hemoglobin: 11.2 g/dL — ABNORMAL LOW (ref 13.0–17.0)
MCH: 27.4 pg (ref 26.0–34.0)
MCHC: 32.9 g/dL (ref 30.0–36.0)
MCV: 83.1 fL (ref 80.0–100.0)
Platelets: 361 10*3/uL (ref 150–400)
RBC: 4.09 MIL/uL — ABNORMAL LOW (ref 4.22–5.81)
RDW: 16.1 % — ABNORMAL HIGH (ref 11.5–15.5)
WBC: 40.1 10*3/uL — ABNORMAL HIGH (ref 4.0–10.5)
nRBC: 0 % (ref 0.0–0.2)

## 2019-03-08 LAB — QUANTIFERON-TB GOLD PLUS (RQFGPL)
QuantiFERON Mitogen Value: 0.01 IU/mL
QuantiFERON Nil Value: 0.01 IU/mL
QuantiFERON TB1 Ag Value: 0.01 IU/mL
QuantiFERON TB2 Ag Value: 0.01 IU/mL

## 2019-03-08 LAB — COMPREHENSIVE METABOLIC PANEL
ALT: 25 U/L (ref 0–44)
AST: 44 U/L — ABNORMAL HIGH (ref 15–41)
Albumin: 1.7 g/dL — ABNORMAL LOW (ref 3.5–5.0)
Alkaline Phosphatase: 133 U/L — ABNORMAL HIGH (ref 38–126)
Anion gap: 14 (ref 5–15)
BUN: 90 mg/dL — ABNORMAL HIGH (ref 8–23)
CO2: 23 mmol/L (ref 22–32)
Calcium: 7.6 mg/dL — ABNORMAL LOW (ref 8.9–10.3)
Chloride: 97 mmol/L — ABNORMAL LOW (ref 98–111)
Creatinine, Ser: 4.23 mg/dL — ABNORMAL HIGH (ref 0.61–1.24)
GFR calc Af Amer: 16 mL/min — ABNORMAL LOW (ref >60)
GFR calc non Af Amer: 14 mL/min — ABNORMAL LOW (ref >60)
Glucose, Bld: 346 mg/dL — ABNORMAL HIGH (ref 70–99)
Potassium: 4.1 mmol/L (ref 3.5–5.1)
Sodium: 134 mmol/L — ABNORMAL LOW (ref 135–145)
Total Bilirubin: 1 mg/dL (ref 0.3–1.2)
Total Protein: 5.5 g/dL — ABNORMAL LOW (ref 6.5–8.1)

## 2019-03-08 LAB — BLOOD GAS, ARTERIAL
Acid-Base Excess: 1.7 mmol/L (ref 0.0–2.0)
Bicarbonate: 23.7 mmol/L (ref 20.0–28.0)
Delivery systems: POSITIVE
Expiratory PAP: 8
FIO2: 0.4
Inspiratory PAP: 14
O2 Saturation: 95.9 %
Patient temperature: 37
pCO2 arterial: 29 mmHg — ABNORMAL LOW (ref 32.0–48.0)
pH, Arterial: 7.52 — ABNORMAL HIGH (ref 7.350–7.450)
pO2, Arterial: 72 mmHg — ABNORMAL LOW (ref 83.0–108.0)

## 2019-03-08 LAB — QUANTIFERON-TB GOLD PLUS: QuantiFERON-TB Gold Plus: UNDETERMINED — AB

## 2019-03-08 LAB — GLUCOSE, CAPILLARY
Glucose-Capillary: 336 mg/dL — ABNORMAL HIGH (ref 70–99)
Glucose-Capillary: 197 mg/dL — ABNORMAL HIGH (ref 70–99)
Glucose-Capillary: 240 mg/dL — ABNORMAL HIGH (ref 70–99)

## 2019-03-08 LAB — PHOSPHORUS: Phosphorus: 5.5 mg/dL — ABNORMAL HIGH (ref 2.5–4.6)

## 2019-03-08 LAB — MAGNESIUM: Magnesium: 2.3 mg/dL (ref 1.7–2.4)

## 2019-03-08 MED ORDER — SODIUM CHLORIDE (PF) 0.9 % IJ SOLN
Freq: Once | INTRAMUSCULAR | Status: AC
  Administered 2019-03-08: 12:00:00 via INTRAPLEURAL
  Filled 2019-03-08: qty 10

## 2019-03-08 MED ORDER — MIDODRINE HCL 5 MG PO TABS
5.0000 mg | ORAL_TABLET | Freq: Three times a day (TID) | ORAL | Status: DC
  Administered 2019-03-08 – 2019-03-09 (×4): 5 mg via ORAL
  Filled 2019-03-08 (×8): qty 1

## 2019-03-08 MED ORDER — SODIUM CHLORIDE 0.9 % IV SOLN
3.0000 g | INTRAVENOUS | Status: DC
  Administered 2019-03-08 – 2019-03-11 (×4): 3 g via INTRAVENOUS
  Filled 2019-03-08: qty 8
  Filled 2019-03-08 (×3): qty 3
  Filled 2019-03-08: qty 8
  Filled 2019-03-08: qty 3

## 2019-03-08 MED ORDER — NOREPINEPHRINE 16 MG/250ML-% IV SOLN
0.0000 ug/min | INTRAVENOUS | Status: DC
  Administered 2019-03-08: 5 ug/min via INTRAVENOUS
  Administered 2019-03-10: 7 ug/min via INTRAVENOUS
  Filled 2019-03-08 (×3): qty 250

## 2019-03-08 MED ORDER — METRONIDAZOLE IN NACL 5-0.79 MG/ML-% IV SOLN
500.0000 mg | Freq: Three times a day (TID) | INTRAVENOUS | Status: DC
  Administered 2019-03-08: 500 mg via INTRAVENOUS
  Filled 2019-03-08 (×3): qty 100

## 2019-03-08 MED ORDER — MORPHINE SULFATE (PF) 2 MG/ML IV SOLN
1.0000 mg | INTRAVENOUS | Status: DC | PRN
  Administered 2019-03-09 – 2019-03-17 (×3): 2 mg via INTRAVENOUS
  Filled 2019-03-08 (×3): qty 1

## 2019-03-08 MED ORDER — MORPHINE SULFATE (PF) 2 MG/ML IV SOLN
2.0000 mg | Freq: Once | INTRAVENOUS | Status: AC
  Administered 2019-03-08: 2 mg via INTRAVENOUS
  Filled 2019-03-08: qty 1

## 2019-03-08 MED ORDER — FLORANEX PO PACK
1.0000 g | PACK | Freq: Three times a day (TID) | ORAL | Status: DC
  Administered 2019-03-08 – 2019-03-19 (×31): 1 g via ORAL
  Filled 2019-03-08 (×37): qty 1

## 2019-03-08 NOTE — Progress Notes (Signed)
Pre hemodialysis assessment   03/08/19 0954  Neurological  Level of Consciousness Alert  Orientation Level Oriented X4  Respiratory  Respiratory Pattern Regular;Unlabored  Chest Assessment Chest expansion symmetrical  Bilateral Breath Sounds Diminished  Cardiac  Pulse Regular  Cardiac Rhythm Atrial fibrillation  Antiarrhythmic device  Antiarrhythmic device ICD  Vascular  R Radial Pulse +2  L Radial Pulse +2  Integumentary  Integumentary (WDL) X  Musculoskeletal  Musculoskeletal (WDL) X  Generalized Weakness Yes  Gastrointestinal  Bowel Sounds Assessment Active  GU Assessment  Genitourinary (WDL) X  Genitourinary Symptoms  (on hemodialysis)  Psychosocial  Psychosocial (WDL) WDL  Patient Behaviors Cooperative;Calm  .

## 2019-03-08 NOTE — Progress Notes (Signed)
Pre hemodialysis report received from C. Nyoka Cowden, Therapist, sports. Received patient in ICU bed 7. Awake, alert and verbally responsive. No acute distress noted. Right ij trialysis catheter without signs and symptoms of infection.  Dressing in place and uncompromised. Accessed  Catheter per policy and found patent on each side. Hemodialysis treatment initiated at  0958. Plan for 3  hours treatment with no UF.   03/08/19 0958  Hand-Off documentation  Report received from (Full Name) Izora Gala, RN  Vital Signs  Temp Source Oral  Pulse Rate (!) 107  BP 97/64  BP Location Right Arm  BP Method Automatic  Patient Position (if appropriate) Lying  Oxygen Therapy  SpO2 92 %  O2 Device Nasal Cannula  O2 Flow Rate (L/min) 2 L/min  Pain Assessment  Pain Scale 0-10  Pain Score 0  Machine Checks  Machine Number 3  Station Number  (icu 7)  UF/Alarm Test Passed  Conductivity: Meter 14  Conductivity: Machine  13.9  pH 7.6  Reverse Osmosis 1  Normal Saline Lot Number [id number]  Dialyzer Lot Number [id number]  Disposable Set Lot Number [id number]  Machine Temperature 98.6 F (37 C)  Musician and Audible Yes  Blood Lines Intact and Secured Yes  Pre Treatment Patient Checks  Vascular access used during treatment Catheter  HD catheter dressing before treatment WDL  Hepatitis B Surface Antigen Results Negative  Date Hepatitis B Surface Antigen Drawn 03/05/19  Hepatitis B Surface Antibody  (suceptible)  Date Hepatitis B Surface Antibody Drawn 03/05/19  Hemodialysis Consent Verified Yes  Hemodialysis Standing Orders Initiated Yes  ECG (Telemetry) Monitor On Yes  Prime Ordered Normal Saline  Length of  DialysisTreatment -hour(s) 3 Hour(s)  Dialyzer Elisio 17H NR  Dialysate 3K;2.5 Ca  Dialysate Flow Ordered 600  Blood Flow Rate Ordered 300 mL/min  Ultrafiltration Goal 0 Liters  During Hemodialysis Assessment  Blood Flow Rate (mL/min) 300 mL/min  Arterial Pressure (mmHg) -160 mmHg  Venous Pressure  (mmHg) 140 mmHg  Transmembrane Pressure (mmHg) 40 mmHg  Ultrafiltration Rate (mL/min) 170 mL/min  Dialysate Flow Rate (mL/min) 600 ml/min  Conductivity: Machine  13.9  HD Safety Checks Performed Yes  Dialysis Fluid Bolus Normal Saline  Bolus Amount (mL) 250 mL (prime)  Intra-Hemodialysis Comments Tx initiated

## 2019-03-08 NOTE — Progress Notes (Signed)
Time out for dialysis   03/08/19 0951  Vital Signs  Pulse Rate (!) 105  Oxygen Therapy  SpO2 93 %  Time-Out for Hemodialysis  What Procedure? dialysis   Pt Identifiers(min of two) First/Last Name;MRN/Account#  Correct Site? Yes  Correct Side? Yes  Correct Procedure? Yes  Consents Verified? Yes  Rad Studies Available? N/A  Safety Precautions Reviewed? Yes

## 2019-03-08 NOTE — Procedures (Signed)
Procedure Note  Procedure: tPA installation via chest tube, subsequent  Indication: Right Empyema  Preforming Provider: Edison Simon, PA-C  Authorizing Provider: [name], MD  Details of Procedure:  The patients chest tube was cleansed with alcohol wipes twice. Using a 22 gauge need, 10 mg of tPA in 40 ml NS was instilled through the chest tubing. The chest tube was then clamped and occlusive tape was applied over the instillation site.   Complications: None.   Plan:  The chest tube should remain clamped for a total of 4 hours before being unclamped, returned to suction, and residuals measured.   -- Edison Simon, PA-C [facility] 03/08/2019, 12:09 PM [phone] M-F: 7am - 4pm

## 2019-03-08 NOTE — Progress Notes (Signed)
Pharmacy Antibiotic Note  Colton Lamb is a 68 y.o. male admitted on 03/04/2019 with SOB treating for PNA. CT chest showed complex right pleural effusion with gas, compatible with an empyema. Patient underwent right chest tube placement, which removed greater than 300 ml of cloudy yellow foul-smelling fluid. Currently receiving cefepime and Flagyl. Will change to Unasyn.  Patient currently receiving HD daily. Patient assessment daily for dialysis needs.  Plan: Unasyn 3 g IV q24h ordered for 2000 to be given in the evenings after HD is completed. Will follow daily to assess HD plan in order to adjust antibiotics as indicated.  Height: 6' (182.9 cm) Weight: 286 lb 6 oz (129.9 kg) IBW/kg (Calculated) : 77.6  Temp (24hrs), Avg:98.6 F (37 C), Min:98.2 F (36.8 C), Max:99.1 F (37.3 C)  Recent Labs  Lab 03/04/19 1414  03/04/19 1629 03/04/19 2109 03/05/19 0526 03/06/19 0437 03/07/19 0607 03/07/19 2104 03/08/19 0444  WBC 31.2*  --   --   --  39.0* 40.0* 36.9*  --  40.1*  CREATININE  --    < >  --  4.38* 4.82* 5.19* 4.94* 3.62* 4.23*  LATICACIDVEN  --   --  2.7* 2.3*  --   --   --   --   --    < > = values in this interval not displayed.    Estimated Creatinine Clearance: 23.6 mL/min (A) (by C-G formula based on SCr of 4.23 mg/dL (H)).    Allergies  Allergen Reactions  . Iodine Other (See Comments)    Shortness of breath, swelling and hives  . Shrimp [Shellfish Allergy] Other (See Comments)    SWELLING    HIVES    SHORTNESS OF BREATH  . Tetracycline Rash    Antimicrobials this admission: Cefepime 8/3 >> 8/6 Vancomycin 8/5 >> 8/5 Unasyn 8/7 >>  Dose adjustments this admission: NA  Microbiology results: 8/3 BCx: NG 4 days 8/3 UCx: insignificant growth  8/3 MRSA PCR: negative 8/4 Body fluid Cx: strep viridans, holding for possible anaerobe  Thank you for allowing pharmacy to be a part of this patient's care.  Tawnya Crook, PharmD Clinical Pharmacist 03/08/2019  2:45 PM

## 2019-03-08 NOTE — Progress Notes (Signed)
Hemodialysis treatment completed at 1300. No UF.  Blood rinsed back, catheter flushed and locked with NS. Red caps replaced. Dressing remains intact and insertion site remains without signs and symptoms of infection.  Post hemodialysis report given to C. Nyoka Cowden, RN.    03/08/19 1315  Hand-Off documentation  Report given to (Full Name) Izora Gala, RN  Vital Signs  Pulse Rate (!) 106  BP 101/69  BP Location Right Arm  BP Method Automatic  Patient Position (if appropriate) Lying  Oxygen Therapy  SpO2 92 %

## 2019-03-08 NOTE — Progress Notes (Signed)
[facility]  ROUNDING NOTE   Subjective:   Currently on [place] 08/06 0701 - 08/07 0700 In: 638.8 [P.O.:240; I.V.:198.8; IV Piggyback:200] Out: 2615 [Urine:1525; Chest Tube:600]   UOP has improved Eating breakfast today    Objective:  Vital signs in last 24 hours:  Temp:  [98.3 F (36.8 C)-99.1 F (37.3 C)] 99.1 F (37.3 C) (08/07 0400) Pulse Rate:  [83-135] 100 (08/07 0600) Resp:  [16-36] 27 (08/07 0600) BP: (63-131)/(40-84) 91/57 (08/07 0600) SpO2:  [87 %-98 %] 94 % (08/07 0600)  Weight change:  Filed Weights   03/05/19 1730 03/06/19 1545 03/06/19 1549  Weight: 132.1 kg 129.9 kg 129.9 kg    Intake/Output: I/O last 3 completed shifts: In: 993.6 [P.O.:240; I.V.:353.6; IV Piggyback:400] Out: 7416 [LAGTX:6468; Other:490; Chest Tube:1450]   Intake/Output this shift:  No intake/output data recorded.  Physical Exam: General: Critically ill  Head: Normocephalic, atraumatic. Moist oral mucosal membranes  Eyes: Anicteric,    Neck: Supple   Lungs:  Bilateral crackles, right sided rhonchi, diminished bilaterally, 2L [place]  Heart: irregular  Abdomen:  Soft, nontender, obese  Extremities:  trace peripheral edema.  Neurologic: Nonfocal, moving all four extremities  Skin: No lesions  Access: Right IJ temp HD catheter 8/4  Right chest tube in place  Basic Metabolic Panel: Recent Labs  Lab 03/05/19 0526 03/05/19 1536 03/06/19 0437 03/07/19 0607 03/07/19 2104 03/08/19 0444  NA 131*  --  135 138 137 134*  K 4.4  --  4.7 4.4 4.1 4.1  CL 96*  --  100 101 98 97*  CO2 17*  --  19* 20* 24 23  GLUCOSE 224*  --  197* 260* 244* 346*  BUN 91*  --  97* 100* 73* 90*  CREATININE 4.82*  --  5.19* 4.94* 3.62* 4.23*  CALCIUM 8.0*  --  7.9* 7.8* 7.8* 7.6*  MG  --   --   --   --  2.2 2.3  PHOS  --  7.9* 8.3* 6.9* 4.3 5.5*    Liver Function Tests: Recent Labs  Lab 03/06/19 0437 03/07/19 0607 03/08/19 0444  AST  --   --  44*  ALT  --   --  25  ALKPHOS  --   --   133*  BILITOT  --   --  1.0  PROT  --   --  5.5*  ALBUMIN 2.3* 1.9* 1.7*   No results for input(s): LIPASE, AMYLASE in the last 168 hours. No results for input(s): AMMONIA in the last 168 hours.  CBC: Recent Labs  Lab 03/04/19 1414 03/05/19 0526 03/06/19 0437 03/07/19 0607 03/08/19 0444  WBC 31.2* 39.0* 40.0* 36.9* 40.1*  NEUTROABS 27.8*  --   --   --   --   HGB 12.1* 11.8* 12.1* 11.7* 11.2*  HCT 37.0* 35.9* 37.0* 35.4* 34.0*  MCV 85.1 85.3 83.7 83.1 83.1  PLT 331 364 443* 381 361    Cardiac Enzymes: No results for input(s): CKTOTAL, CKMB, CKMBINDEX, TROPONINI in the last 168 hours.  BNP: Invalid input(s): POCBNP  CBG: Recent Labs  Lab 03/07/19 0730 03/07/19 1144 03/07/19 1607 03/07/19 1937 03/08/19 0745  GLUCAP 259* 238* 295* 189* 50*    Microbiology: Results for orders placed or performed during the hospital encounter of 03/04/19  Urine culture     Status: Abnormal   Collection Time: 03/04/19  2:14 PM   Specimen: Urine, Clean Catch  Result Value Ref Range Status   Specimen Description   Final  URINE, CLEAN CATCH Performed at [facility], [address], [place], [place] [address]    Special Requests   Final    NONE Performed at [facility], [address], [place], [place] [address]    Culture (A)  Final    <10,000 COLONIES/mL INSIGNIFICANT GROWTH Performed at [facility] [address], [place], [place] [address]    Report Status 03/06/2019 FINAL  Final  SARS Coronavirus 2 [facility] order, Performed in [facility] hospital lab) Nasopharyngeal Nasopharyngeal Swab     Status: None   Collection Time: 03/04/19  2:17 PM   Specimen: Nasopharyngeal Swab  Result Value Ref Range Status   SARS Coronavirus 2 NEGATIVE NEGATIVE Final    Comment: (NOTE) If result is NEGATIVE SARS-CoV-2 target nucleic acids are NOT DETECTED. The SARS-CoV-2 RNA is generally detectable in upper and lower  respiratory specimens during the acute  phase of infection. The lowest  concentration of SARS-CoV-2 viral copies this assay can detect is 250  copies / mL. A negative result does not preclude SARS-CoV-2 infection  and should not be used as the sole basis for treatment or other  patient management decisions.  A negative result may occur with  improper specimen collection / handling, submission of specimen other  than nasopharyngeal swab, presence of viral mutation(s) within the  areas targeted by this assay, and inadequate number of viral copies  (<250 copies / mL). A negative result must be combined with clinical  observations, patient history, and epidemiological information. If result is POSITIVE SARS-CoV-2 target nucleic acids are DETECTED. The SARS-CoV-2 RNA is generally detectable in upper and lower  respiratory specimens dur ing the acute phase of infection.  Positive  results are indicative of active infection with SARS-CoV-2.  Clinical  correlation with patient history and other diagnostic information is  necessary to determine patient infection status.  Positive results do  not rule out bacterial infection or co-infection with other viruses. If result is PRESUMPTIVE POSTIVE SARS-CoV-2 nucleic acids MAY BE PRESENT.   A presumptive positive result was obtained on the submitted specimen  and confirmed on repeat testing.  While 2019 novel coronavirus  (SARS-CoV-2) nucleic acids may be present in the submitted sample  additional confirmatory testing may be necessary for epidemiological  and / or clinical management purposes  to differentiate between  SARS-CoV-2 and other Sarbecovirus currently known to infect humans.  If clinically indicated additional testing with an alternate test  methodology [phone]) is advised. The SARS-CoV-2 RNA is generally  detectable in upper and lower respiratory sp ecimens during the acute  phase of infection. The expected result is Negative. Fact Sheet for Patients:   StrictlyIdeas.no Fact Sheet for Healthcare Providers: BankingDealers.co.za This test is not yet approved or cleared by the [place] FDA and has been authorized for detection and/or diagnosis of SARS-CoV-2 by FDA under an Emergency Use Authorization (EUA).  This EUA will remain in effect (meaning this test can be used) for the duration of the COVID-19 declaration under Section 564(b)(1) of the Act, 21 U.S.C. section 360bbb-3(b)(1), unless the authorization is terminated or revoked sooner. Performed at [facility], [address] [place], [place] [address]   Blood Culture (routine x 2)     Status: None (Preliminary result)   Collection Time: 03/04/19  2:55 PM   Specimen: BLOOD  Result Value Ref Range Status   Specimen Description BLOOD BLOOD RIGHT HAND  Final   Special Requests   Final  BOTTLES DRAWN AEROBIC AND ANAEROBIC Blood Culture adequate volume   Culture   Final    NO GROWTH 4 DAYS Performed at [facility], [address] [place], [place] [address]    Report Status PENDING  Incomplete  MRSA PCR Screening     Status: None   Collection Time: 03/04/19  8:36 PM   Specimen: Nasopharyngeal  Result Value Ref Range Status   MRSA by PCR NEGATIVE NEGATIVE Final    Comment:        The GeneXpert MRSA Assay (FDA approved for NASAL specimens only), is one component of a comprehensive MRSA colonization surveillance program. It is not intended to diagnose MRSA infection nor to guide or monitor treatment for MRSA infections. Performed at [facility], [address] [place], [place] [address]   Blood Culture (routine x 2)     Status: None (Preliminary result)   Collection Time: 03/04/19 10:01 PM   Specimen: BLOOD  Result Value Ref Range Status   Specimen Description BLOOD RIGHT ASSIST CONTROL  Final   Special Requests   Final    BOTTLES DRAWN AEROBIC AND ANAEROBIC Blood Culture adequate volume    Culture   Final    NO GROWTH 4 DAYS Performed at [facility], [address] [place], [place] [address]    Report Status PENDING  Incomplete  Body fluid culture     Status: None (Preliminary result)   Collection Time: 03/05/19 11:21 AM   Specimen: [facility] Cytology Pleural fluid  Result Value Ref Range Status   Specimen Description   Final    PLEURAL Performed at [facility], [address] [place], [place] [address]    Special Requests   Final    NONE Performed at [facility], [address] [place], [place] [address]    Gram Stain   Final    FEW WBC PRESENT, PREDOMINANTLY PMN ABUNDANT GRAM NEGATIVE RODS ABUNDANT GRAM POSITIVE COCCI FEW GRAM VARIABLE ROD    Culture   Final    MODERATE VIRIDANS STREPTOCOCCUS HOLDING FOR POSSIBLE ANAEROBE Performed at [facility], [place] [address], [place],  [address]    Report Status PENDING  Incomplete    Coagulation Studies: No results for input(s): LABPROT, INR in the last 72 hours.  Urinalysis: No results for input(s): COLORURINE, LABSPEC, PHURINE, GLUCOSEU, HGBUR, BILIRUBINUR, KETONESUR, PROTEINUR, UROBILINOGEN, NITRITE, LEUKOCYTESUR in the last 72 hours.  Invalid input(s): APPERANCEUR    Imaging: Ct Chest Wo Contrast  Result Date: 03/06/2019 CLINICAL DATA:  Shortness of breath. Pneumonia. Evaluate for empyema and potential for chest tube. EXAM: CT CHEST WITHOUT CONTRAST TECHNIQUE: Multidetector CT imaging of the chest was performed following the standard protocol without IV contrast. COMPARISON:  Chest radiograph 03/05/2019 FINDINGS: Cardiovascular: Heart is enlarged with coronary artery calcifications. Patient has a cardiac ICD with leads in the right atrium and right ventricle. Small amount of pericardial fluid. Mild aneurysmal enlargement of the ascending thoracic aorta measuring up to 4.1 cm. Right jugular central line with the tip near the SVC and right atrium junction.  Mediastinum/Nodes: Prominent right infrahilar tissue measuring approximately 1.4 cm in short axis on sequence 2 image 79. No significant left hilar lymphadenopathy. Small mediastinal lymph nodes particularly in the upper mediastinum along the right side of the trachea and esophagus. Lungs/Pleura: Complex small to moderate-sized right pleural effusion along the posterior right chest containing multiple pockets of gas. Findings are suggestive for an empyema. No left pleural fluid. Complete consolidation and  volume loss in the right lower lobe. There is no significant aeration in the right lower lobe bronchus and endobronchial lesion cannot be excluded in the right lower lobe. Pleural thickening or fluid along the right minor fissure. Mild interstitial thickening in the anterior right upper lobe. There is some volume loss in the right middle lobe. No significant airspace disease or consolidation in left lung. Motion artifact limits evaluation of the lungs. Upper Abdomen: Trace perihepatic ascites on sequence 2, image 179. High-density material in the gallbladder could represent stones or sludge. Non significant gallbladder distention or inflammatory changes. Small nodule in the anterior left upper quadrant sequence 2, image 142 is nonspecific could represent a splenule, granuloma or lymph node. Overall, no significant lymph node enlargement in the upper abdomen. Musculoskeletal: No acute bone abnormality. IMPRESSION: 1. Complex right pleural effusion with multiple pockets of gas. Findings are compatible with an empyema. 2. Complete consolidation and collapse of the right lower lobe. Findings could be secondary to pneumonia but an underlying endobronchial lesion and neoplasm cannot be excluded. 3. Prominent right infrahilar lymph node enlargement could be reactive but an underlying neoplastic process cannot be excluded. Recommend follow-up imaging after resolution of the empyema. 4. Trace perihepatic ascites. 5.  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 Recommend annual imaging followup by CTA or MRA. This recommendation follows 2010 ACCF/AHA/AATS/ACR/ASA/SCA/SCAI/SIR/STS/SVM Guidelines for the Diagnosis and Management of Patients with Thoracic Aortic Disease. Circulation. 2010; 121: W979-G921. Aortic aneurysm NOS (ICD10-I71.9) Electronically Signed   By: Markus Daft M.D.   On: 03/06/2019 15:37   Ct Image Guided Drainage By Percutaneous Catheter  Result Date: 03/06/2019 INDICATION: 68 year old with right lung empyema. EXAM: CT-GUIDED RIGHT CHEST TUBE PLACEMENT MEDICATIONS: None ANESTHESIA/SEDATION: None COMPLICATIONS: None immediate. TECHNIQUE: Informed consent was obtained from the patient after a thorough discussion of the procedural risks, benefits and alternatives. All questions were addressed. A timeout was performed prior to the initiation of the procedure. PROCEDURE: CT images through the chest were obtained. The right mid axillary region was prepped with chlorhexidine and sterile field was created. Skin and soft tissues were anesthetized with 1% lidocaine. 18 gauge trocar needle was directed into the right pleural space using CT guidance. Pleural fluid was aspirated. A stiff Amplatz wire was advanced into the pleural space and the tract was dilated to accommodate a 14 [place] multipurpose drain. Purulent foul-smelling fluid was aspirated from the pleural space. Tube was attached to a PleurEvac chest drain system. Greater than 300 mL of fluid was removed by the end of the procedure. Catheter was sutured to skin and a dressing was placed. FINDINGS: Complex right pleural fluid effusion with multiple gas pockets. Findings compatible with empyema. Greater than 300 mL of foul-smelling fluid was removed from the right pleural space. IMPRESSION: CT-guided placement of right chest tube for empyema. Electronically Signed   By: Markus Daft M.D.   On: 03/06/2019 14:36     Medications:   . sodium  chloride Stopped (03/06/19 1411)  . ceFEPime (MAXIPIME) IV Stopped (03/07/19 2139)  . norepinephrine (LEVOPHED) Adult infusion 3 mcg/min (03/08/19 0600)   . aspirin EC  81 mg Oral Daily  . atorvastatin  80 mg Oral Daily  . budesonide (PULMICORT) nebulizer solution  0.25 mg Nebulization BID  . Chlorhexidine Gluconate Cloth  6 each Topical Q0600  . heparin  5,000 Units Subcutaneous Q8H  . hydrocortisone sod succinate (SOLU-CORTEF) inj  50 mg Intravenous Q6H  . insulin aspart  0-9 Units Subcutaneous TID  AC & HS  . ipratropium-albuterol  3 mL Nebulization Q4H  . niacin  1,000 mg Oral QHS  . pantoprazole (PROTONIX) IV  40 mg Intravenous Daily   sodium chloride, acetaminophen **OR** acetaminophen, albuterol, nitroGLYCERIN, ondansetron **OR** ondansetron (ZOFRAN) IV, polyethylene glycol  Assessment/ Plan:  [name] is a 68 y.o. white male with diabetes mellitus type II, hypertension, obstructive sleep apnea, congestive heart failure, atrial fibrillation, hyperlipidemia, Crohn's disease, coronary artery disease, GERD, pacemaker, morbid obesity,  , who was admitted to [facility] on acute exacerbation of COPD, acute exacerbation of congestive heart failure, right pleural effusion, acute respiratory failure requiring BIPAP, sepsis with pneumonia and acute renal failure requiring hemodialysis   1. Acute renal failure: baseline creatinine of 1.1, normal GFR on 01/02/2019.  wiith underlying diabetic nephropathy with glucosuria.  - UOP now 1500 cc yesterday, however BUN/Cr have mproved - Plan on HD today. Minimal UF as UOP has improved significantly  - foley for urine measurements, may be removed from nephrology stand point  2. Acute respiratory failure:   Secondary to pneumonia, congestive heart failure and COPD exacerbation And now has chest tube placed via CT guidance on 03/06/2019 Requiring O2 supplementation with [place]   3. Acute exacerbation of systolic congestive heart failure:  7/10  echocardiogram 35-40% Volume status is acceptable   4. Sepsis with hypotension: cultures pending. G pos and G Neg organsims, strep viridans Chest tube cefepime   LOS: 4 Sherrie Marsan 8/7/20209:18 AM

## 2019-03-08 NOTE — Progress Notes (Signed)
Name: TJAY VELAZQUEZ MRN: [id number] DOB: 08/04/1950     CONSULTATION DATE: 03/04/2019  CHIEF COMPLAINT: Shortness for breath  HISTORY OF PRESENT ILLNESS:   68 y.o male with PMH of  Systolic HF with EF 32% (3/55/73), HTN, PAF (on xarelto), sleep apnea, ischemic cardiomyopathy, CAD, obesity, HLD, GERD presented to ED via EMS on CPAP with AMS and several days of worsening SOB. In ED he was placed on BiPAP and code sepsis activated due to Leukocytosis, lactic acidosis, and hypotension. IV abx initiated.  CXR showed pulmonary edema, ED gave IVF and lasix judiciously as patient was found to have AKI.      OVERNIGHT EVENTS: Patient has received hemodialysis daily since 8/4. Urine output improving despite no change in renal function. He reports bowel movement and is eating well. He remains on levophed for hypotension.  resp status improved, minimal oxygen support  SIGNIFICANT EVENTS: 8/3 > CT head: Chronic ventriculomegaly without evidence of focal brain insult. No visible change since February 2018. This is felt most likely secondary to central atrophy. Normal pressure hydrocephalus is not excluded but not favored. 8/3 > Admitted to ICU requiring Bipap  8/3 > Levophed initiated for hypotension 8/4> Thoracentesis: 200 mL hazy yellow fluid sent to lab 8/4> Trialysis RIJ placed 8/4 > 1st HD treatment- no UF  8/5> 2nd HD treatment - no UF 8/5> CT placed to drain right pleural empyema 8/6> TPA instilled in CT 8/7> 3rd HD treatment - no UF 8/7> 2nd round of TPA instilled in CT  CULTURES: 8/3 > Blood & Urine cultures- pending, no growth 8/7 8/3 > COVID 19 - negative 8/3 > MRSA PCR- negative 8/3 > Procalcitonin - 27.61 ~ 31.81 ~ 27.22 8/4> Pleural fluid- LD: 5,463 & WBC: 17,693 / gram stain positive for abundant GNR & GPC 8/7> Pleural fluid cytology- moderate viridans streptococcus, holding for possible anaerobe  ANTIBIOTICS: 8/3 > Cefepime 8/3 > Metronidazole x 1 dose, d/c'd 8/3 8/3 >  Vancomycin x 2 doses, d/c'd 8/3   PAST MEDICAL HISTORY :   has a past medical history of Atrial flutter ([place]), Chronic systolic heart failure ([place]), Coronary artery disease, Crohn's ileocolitis ([place]), GERD (gastroesophageal reflux disease), adenomatous colonic polyps (11/2003), Hyperlipidemia, Hypertension, Ischemic cardiomyopathy, Obesity, Paroxysmal atrial fibrillation ([place]), Sleep apnea, Syncope, and Type II diabetes mellitus ([place]).  has a past surgical history that includes Ileocecal resection and sigmoid enterocolonic fistula repair (09/1998); Hydrocele surgery (Bilateral); Foot surgery (Left); Cardioversion (N/A, 03/05/2014); Cardiac defibrillator placement (04/16/2014); Cardiac electrophysiology study and ablation (04/16/2014); Coronary angioplasty with stent (2007; 2008 X 2); Cardiac catheterization (10/2013); left heart catheterization with coronary angiogram (N/A, 11/22/2013); Atrial flutter ablation (N/A, 04/16/2014); implantable cardioverter defibrillator implant (N/A, 04/16/2014); Cataract extraction w/PHACO (Right, 01/04/2017); and Cataract extraction w/PHACO (Left, 02/08/2017).   Allergies  Allergen Reactions  . Iodine Other (See Comments)    Shortness of breath, swelling and hives  . Shrimp [Shellfish Allergy] Other (See Comments)    SWELLING    HIVES    SHORTNESS OF BREATH  . Tetracycline Rash    REVIEW OF SYSTEMS:    Gen:  Denies  fever, sweats, chills weight loss  HEENT: Denies blurred vision, double vision, ear pain, eye pain, hearing loss, nose bleeds, sore throat Cardiac:  No dizziness, chest pain or heaviness, chest tightness,edema, No JVD Resp:   cough, sputum production (does not know characteristics), -shortness of breath,-wheezing, -hemoptysis Gi: Denies swallowing difficulty, stomach pain, nausea or vomiting, diarrhea, constipation, bowel incontinence Gu:  Denies bladder incontinence,  burning urine Ext:   Denies Joint pain, stiffness or swelling Skin: Denies  skin rash, easy  bruising or bleeding or hives Endoc:  Denies polyuria, polydipsia , polyphagia or weight change Psych:   Denies depression, insomnia or hallucinations  Other:  All other systems negative   VITAL SIGNS: Temp:  [98.2 F (36.8 C)-99.1 F (37.3 C)] 98.2 F (36.8 C) (08/07 0800) Pulse Rate:  [83-122] 122 (08/07 1200) Resp:  [16-34] 16 (08/07 1015) BP: (63-117)/(40-105) 97/73 (08/07 1200) SpO2:  [90 %-98 %] 93 % (08/07 1200)   I/O last 3 completed shifts: In: 1004.6 [P.O.:240; I.V.:364.6; IV Piggyback:400] Out: 7628 [BTDVV:6160; Other:490; Chest Tube:1450] Total I/O In: 153.7 [P.O.:120; I.V.:33.7] Out: 255 [Urine:165; Chest Tube:90]   SpO2: 93 % O2 Flow Rate (L/min): 2 L/min FiO2 (%): 30 %   Physical Examination:  GENERAL:critically ill appearing but improving HEAD: Normocephalic, atraumatic.  EYES: Pupils equal, round, reactive to light.  No scleral icterus.  MOUTH: Moist mucosal membrane. NECK: Supple. No JVD.  PULMONARY: +rhonchi, normal effort and rate, 2L  CARDIOVASCULAR: Distant heart sounds, irregular rhythm (Afib) with regular rate GASTROINTESTINAL: Soft, nontender, -distended. No masses. Positive bowel sounds. No hepatosplenomegaly.  MUSCULOSKELETAL: No swelling or clubbing, +2 edema noted in lower extremities, left more pronounced.  NEUROLOGIC: Alert and oriented x 3, able to follow simple commands SKIN:intact,warm,dry  I personally reviewed lab work that was obtained in last 24 hrs. CBC    Component Value Date/Time   WBC 40.1 (H) 03/08/2019 0444   RBC 4.09 (L) 03/08/2019 0444   HGB 11.2 (L) 03/08/2019 0444   HGB 13.9 11/22/2013 0301   HCT 34.0 (L) 03/08/2019 0444   HCT 43.3 11/22/2013 0301   PLT 361 03/08/2019 0444   PLT 350 11/22/2013 0301   MCV 83.1 03/08/2019 0444   MCV 81 11/22/2013 0301   MCH 27.4 03/08/2019 0444   MCHC 32.9 03/08/2019 0444   RDW 16.1 (H) 03/08/2019 0444   RDW 16.1 (H) 11/22/2013 0301   LYMPHSABS 0.7 03/04/2019 1414   MONOABS  1.5 (H) 03/04/2019 1414   EOSABS 0.1 03/04/2019 1414   BASOSABS 0.1 03/04/2019 1414   CMP Latest Ref Rng & Units 03/08/2019 03/07/2019 03/07/2019  Glucose 70 - 99 mg/dL 346(H) 244(H) 260(H)  BUN 8 - 23 mg/dL 90(H) 73(H) 100(H)  Creatinine 0.61 - 1.24 mg/dL 4.23(H) 3.62(H) 4.94(H)  Sodium 135 - 145 mmol/L 134(L) 137 138  Potassium 3.5 - 5.1 mmol/L 4.1 4.1 4.4  Chloride 98 - 111 mmol/L 97(L) 98 101  CO2 22 - 32 mmol/L 23 24 20(L)  Calcium 8.9 - 10.3 mg/dL 7.6(L) 7.8(L) 7.8(L)  Total Protein 6.5 - 8.1 g/dL 5.5(L) - -  Total Bilirubin 0.3 - 1.2 mg/dL 1.0 - -  Alkaline Phos 38 - 126 U/L 133(H) - -  AST 15 - 41 U/L 44(H) - -  ALT 0 - 44 U/L 25 - -   Magnesium: 2.3  Phosphorus: 5.5  MEDICATIONS: I have reviewed all medications and confirmed regimen as documented   CULTURE RESULTS   Recent Results (from the past 240 hour(s))  Urine culture     Status: Abnormal   Collection Time: 03/04/19  2:14 PM   Specimen: Urine, Clean Catch  Result Value Ref Range Status   Specimen Description   Final    URINE, CLEAN CATCH Performed at [facility], [address] [place],  [address]    Special Requests   Final    NONE Performed at [facility], [place], [place] [address]    Culture (A)  Final    <10,000 COLONIES/mL INSIGNIFICANT GROWTH Performed at [facility] [address], [place], [place] [address]    Report Status 03/06/2019 FINAL  Final  SARS Coronavirus 2 [facility] order, Performed in [facility] hospital lab) Nasopharyngeal Nasopharyngeal Swab     Status: None   Collection Time: 03/04/19  2:17 PM   Specimen: Nasopharyngeal Swab  Result Value Ref Range Status   SARS Coronavirus 2 NEGATIVE NEGATIVE Final    Comment: (NOTE) If result is NEGATIVE SARS-CoV-2 target nucleic acids are NOT DETECTED. The SARS-CoV-2 RNA is generally detectable in upper and lower  respiratory specimens during the acute phase of infection. The lowest   concentration of SARS-CoV-2 viral copies this assay can detect is 250  copies / mL. A negative result does not preclude SARS-CoV-2 infection  and should not be used as the sole basis for treatment or other  patient management decisions.  A negative result may occur with  improper specimen collection / handling, submission of specimen other  than nasopharyngeal swab, presence of viral mutation(s) within the  areas targeted by this assay, and inadequate number of viral copies  (<250 copies / mL). A negative result must be combined with clinical  observations, patient history, and epidemiological information. If result is POSITIVE SARS-CoV-2 target nucleic acids are DETECTED. The SARS-CoV-2 RNA is generally detectable in upper and lower  respiratory specimens dur ing the acute phase of infection.  Positive  results are indicative of active infection with SARS-CoV-2.  Clinical  correlation with patient history and other diagnostic information is  necessary to determine patient infection status.  Positive results do  not rule out bacterial infection or co-infection with other viruses. If result is PRESUMPTIVE POSTIVE SARS-CoV-2 nucleic acids MAY BE PRESENT.   A presumptive positive result was obtained on the submitted specimen  and confirmed on repeat testing.  While 2019 novel coronavirus  (SARS-CoV-2) nucleic acids may be present in the submitted sample  additional confirmatory testing may be necessary for epidemiological  and / or clinical management purposes  to differentiate between  SARS-CoV-2 and other Sarbecovirus currently known to infect humans.  If clinically indicated additional testing with an alternate test  methodology [phone]) is advised. The SARS-CoV-2 RNA is generally  detectable in upper and lower respiratory sp ecimens during the acute  phase of infection. The expected result is Negative. Fact Sheet for Patients:  StrictlyIdeas.no Fact Sheet  for Healthcare Providers: BankingDealers.co.za This test is not yet approved or cleared by the [place] FDA and has been authorized for detection and/or diagnosis of SARS-CoV-2 by FDA under an Emergency Use Authorization (EUA).  This EUA will remain in effect (meaning this test can be used) for the duration of the COVID-19 declaration under Section 564(b)(1) of the Act, 21 U.S.C. section 360bbb-3(b)(1), unless the authorization is terminated or revoked sooner. Performed at [facility], [address] [place], [place] [address]   Blood Culture (routine x 2)     Status: None (Preliminary result)   Collection Time: 03/04/19  2:55 PM   Specimen: BLOOD  Result Value Ref Range Status   Specimen Description BLOOD BLOOD RIGHT HAND  Final   Special Requests   Final    BOTTLES DRAWN AEROBIC AND ANAEROBIC Blood Culture adequate volume   Culture   Final    NO GROWTH 4 DAYS Performed at [facility], [place],  [place], [place] [address]    Report Status PENDING  Incomplete  MRSA PCR Screening     Status: None   Collection Time: 03/04/19  8:36 PM   Specimen: Nasopharyngeal  Result Value Ref Range Status   MRSA by PCR NEGATIVE NEGATIVE Final    Comment:        The GeneXpert MRSA Assay (FDA approved for NASAL specimens only), is one component of a comprehensive MRSA colonization surveillance program. It is not intended to diagnose MRSA infection nor to guide or monitor treatment for MRSA infections. Performed at [facility], [address] [place], [place] [address]   Blood Culture (routine x 2)     Status: None (Preliminary result)   Collection Time: 03/04/19 10:01 PM   Specimen: BLOOD  Result Value Ref Range Status   Specimen Description BLOOD RIGHT ASSIST CONTROL  Final   Special Requests   Final    BOTTLES DRAWN AEROBIC AND ANAEROBIC Blood Culture adequate volume   Culture   Final    NO GROWTH 4 DAYS Performed at  [facility], [address] [place], [place] [address]    Report Status PENDING  Incomplete  Body fluid culture     Status: None (Preliminary result)   Collection Time: 03/05/19 11:21 AM   Specimen: [facility] Cytology Pleural fluid  Result Value Ref Range Status   Specimen Description   Final    PLEURAL Performed at [facility], [address] [place], [place] [address]    Special Requests   Final    NONE Performed at [facility], [address] [place], [place] [address]    Gram Stain   Final    FEW WBC PRESENT, PREDOMINANTLY PMN ABUNDANT GRAM NEGATIVE RODS ABUNDANT GRAM POSITIVE COCCI FEW GRAM VARIABLE ROD    Culture   Final    MODERATE VIRIDANS STREPTOCOCCUS HOLDING FOR POSSIBLE ANAEROBE SUSCEPTIBILITIES TO FOLLOW Performed at [facility], [place] [address] [place], [place] [address]    Report Status PENDING  Incomplete       Indwelling Urinary Catheter continued, requirement due to   Reason to continue Indwelling Urinary Catheter strict Intake/Output monitoring for hemodynamic instability   Central Line/ continued, requirement due to  Reason to continue [place] of central venous pressure or other hemodynamic parameters and poor IV access    ASSESSMENT AND PLAN SYNOPSIS  68 y.o male with resolved hypoxic respiratory failure and septic shock secondary to ongoing empyema and CAP STREP VIRIDANS pneumonia in the presence of acute renal failure requiring hemodialysis.   Empyema -Cardiothoracic surgery consulted  -Chest tubes place  -TPA administration x2   Severe ACUTE Hypoxic Respiratory Failure -Improving, currently requiring 2L [place] -continue Bronchodilator Therapy -Wean Fio2 and PEEP as tolerated  Diarrhea -Lactobacillus  Started Will check c diff if worsens  ACUTE KIDNEY INJURY/Renal Failure -Nephrology consulted -hemodialyses PRN -follow chem 7 -follow UO  -improving  -continue Foley Catheter -Avoid  nephrotoxic agents  SHOCK-SEPSIS -use vasopressors to keep MAP>65 -follow up cultures -emperic ABX -stress dose steroids given 8/6 -Midodrine for hypotension in an effort to wean vasopressors as tolerated    CARDIAC ICU monitoring   ID -continue IV abx as prescibed -follow up cultures Will continue with Unasyn    GI GI PROPHYLAXIS as indicated  NUTRITIONAL STATUS DIET-->Heart heathy/Carb modified  -Eating well Constipation protocol as indicated   ENDO - will use ICU hypoglycemic\Hyperglycemia protocol if needed   ELECTROLYTES -follow labs as needed -replace  as needed -pharmacy consultation and following   DVT/GI PRX ordered TRANSFUSIONS AS NEEDED MONITOR FSBS ASSESS the need for LABS    Critical Care Time devoted to patient care services described in this note is 40  minutes.   Patient is improving but remains critically ill with multiorgan involvement.    Corrin Parker, M.D.  [facility]  Medical Director [place] Director [facility] Cardio-Pulmonary Department

## 2019-03-08 NOTE — Progress Notes (Signed)
Inpatient Diabetes Program Recommendations  AACE/ADA: New Consensus Statement on Inpatient Glycemic Control (2015)  Target Ranges:  Prepandial:   less than 140 mg/dL      Peak postprandial:   less than 180 mg/dL (1-2 hours)      Critically ill patients:  140 - 180 mg/dL   Results for Colton Lamb, Colton Lamb (MRN [id number]) as of 03/08/2019 08:31  Ref. Range 03/07/2019 07:30 03/07/2019 11:44 03/07/2019 16:07 03/07/2019 19:37  Glucose-Capillary Latest Ref Range: 70 - 99 mg/dL 259 (H)  5 units NOVOLOG  238 (H)  3 units NOVOLOG  295 (H)  5 units NOVOLOG  189 (H)  2 units NOVOLOG    Results for Colton Lamb, Colton Lamb (MRN [id number]) as of 03/08/2019 08:31  Ref. Range 03/08/2019 07:45  Glucose-Capillary Latest Ref Range: 70 - 99 mg/dL 336 (H)  7 units NOVOLOG     Home DM Meds: Jardiance 10 mg Daily                             Metformin 1000 mg BID  Current Orders: Novolog Sensitive Correction Scale/ SSI (0-9 units) TID AC + HS     MD- Note patient getting Solucortef 50 mg Q6 hours.  CBGs >250 mg/dl.  Home oral DM meds are on hold.  Please consider adding low dose basal insulin to in-hospital insulin regimen while patient getting steroids:  Lantus 13 units Daily (0.1 units/kg dosing)    --Will follow patient during hospitalization--  Wyn Quaker RN, MSN, CDE Diabetes Coordinator Inpatient Glycemic Control Team Team Pager: [phone] (8a-5p)

## 2019-03-08 NOTE — Progress Notes (Signed)
[facility] [place] NAME: Jahmani Staup    MR#:  [id number]  DATE OF BIRTH:  [birth year]  SUBJECTIVE:   Getting hemodialysis at bedside.  Patient is saying that he is tired of being sick.  REVIEW OF SYSTEMS:  Review of Systems  Constitutional: Negative for chills and fever.  HENT: Negative for congestion and sore throat.   Eyes: Negative for blurred vision and double vision.  Respiratory: Positive for shortness of breath. Negative for cough.   Cardiovascular: Negative for chest pain and palpitations.  Gastrointestinal: Negative for nausea and vomiting.  Genitourinary: Negative for dysuria and urgency.  Musculoskeletal: Negative for back pain and neck pain.  Neurological: Negative for dizziness and headaches.  Psychiatric/Behavioral: Negative for depression. The patient is not nervous/anxious.     DRUG ALLERGIES:   Allergies  Allergen Reactions  . Iodine Other (See Comments)    Shortness of breath, swelling and hives  . Shrimp [Shellfish Allergy] Other (See Comments)    SWELLING    HIVES    SHORTNESS OF BREATH  . Tetracycline Rash   VITALS:  Blood pressure 97/73, pulse (!) 122, temperature 98.2 F (36.8 C), temperature source Oral, resp. rate 16, height 6' (1.829 m), weight 129.9 kg, SpO2 93 %. PHYSICAL EXAMINATION:  Physical Exam  GENERAL:  Laying in the bed with no acute distress cooperative.[place] Kitchen  HEENT: Head atraumatic, normocephalic. Pupils equal, round, reactive to light and accommodation. No scleral icterus. Extraocular muscles intact. Oropharynx and nasopharynx clear.  NECK:  Supple, no jugular venous distention. No thyroid enlargement. LUNGS: + Diminished breath sounds in the right lower to mid lung.  Chest tube present in the right lung.  Rhonchi. CARDIOVASCULAR: Irregularly irregular rhythm, regular rate, S1, S2 normal. No murmurs, rubs, or gallops.  ABDOMEN: Soft, nontender, nondistended. Bowel sounds present.   EXTREMITIES: No pedal edema, cyanosis, or clubbing.  NEUROLOGIC: CN 2-12 intact, no focal deficits. 5/5 muscle strength throughout all extremities. Sensation intact throughout. Gait not checked.  PSYCHIATRIC: The patient is alert and oriented x 3.  SKIN: No obvious rash, lesion, or ulcer.  LABORATORY PANEL:  Male CBC Recent Labs  Lab 03/08/19 0444  WBC 40.1*  HGB 11.2*  HCT 34.0*  PLT 361   ------------------------------------------------------------------------------------------------------------------ Chemistries  Recent Labs  Lab 03/08/19 0444  NA 134*  K 4.1  CL 97*  CO2 23  GLUCOSE 346*  BUN 90*  CREATININE 4.23*  CALCIUM 7.6*  MG 2.3  AST 44*  ALT 25  ALKPHOS 133*  BILITOT 1.0   RADIOLOGY:  No results found. ASSESSMENT AND PLAN:   Septic shock secondary to pneumonia, empyema, status post chest tube in the right lung, continue IV antibiotics.,  Low-dose pressors, WBC trending down, afebrile, continue bronchodilators, stress dose steroids.,  Continue cefepime, Flagyl follow pleural fluid cultures.  Patient is on stress dose steroids, continue low-dose pressors.   Acute hypoxic respiratory failure secondary to acute on chronic systolic CHF, CAP, and right pleural effusion' status post chest tube insertion on the right side, surgery is following.    Acute renal failure, secondary to septic shock' does have metabolic acidosis, improving with bicarb, nephrology is following on daily need for dialysis.  Metabolic acidosis is corrected.    Chronic atrial fibrillation ' restart Xarelto as soon as patient does not need any more procedures..history of CAD, ischemic cardiomyopathy, status post ICD, combined CHF Supposed to be on Entresto but due to hypotension requiring pressors not on Entresto.  on amiodarone at home.  -Diabetes mellitus type 2, patient is on Jardiance, metformin both are held now, continue sliding scale insulin with coverage.  Elevated blood sugar  secondary to steroid use also.  [place] coordinator, may need to start low-dose Lantus.   Prognosis poor, high risk for cardiac arrest secondary to multiple medical problems, advanced age.  All the records are reviewed and case discussed with Care Management/Social Worker. Management plans discussed with the patient, family and they are in agreement.  CODE STATUS: Full Code  TOTAL TIME TAKING CARE OF THIS PATIENT: 40 minutes.   More than 50% of the time was spent in counseling/coordination of care: YES  POSSIBLE D/C unknown, DEPENDING ON CLINICAL CONDITION.   Epifanio Lesches M.D on 03/08/2019 at 1:16 PM  Between 7am to 6pm - Pager - [phone]  After 6pm go to [email or web address] - Proofreader  [employer]  Hospitalists  Office  [phone]  CC: Primary care physician; Toni Arthurs, NP  Note: This dictation was prepared with Dragon dictation along with smaller phrase technology. Any transcriptional errors that result from this process are unintentional.

## 2019-03-09 ENCOUNTER — Inpatient Hospital Stay: Payer: Medicare Other

## 2019-03-09 LAB — BASIC METABOLIC PANEL
Anion gap: 15 (ref 5–15)
BUN: 72 mg/dL — ABNORMAL HIGH (ref 8–23)
CO2: 24 mmol/L (ref 22–32)
Calcium: 7.6 mg/dL — ABNORMAL LOW (ref 8.9–10.3)
Chloride: 98 mmol/L (ref 98–111)
Creatinine, Ser: 3.55 mg/dL — ABNORMAL HIGH (ref 0.61–1.24)
GFR calc Af Amer: 19 mL/min — ABNORMAL LOW (ref >60)
GFR calc non Af Amer: 17 mL/min — ABNORMAL LOW (ref >60)
Glucose, Bld: 265 mg/dL — ABNORMAL HIGH (ref 70–99)
Potassium: 3.9 mmol/L (ref 3.5–5.1)
Sodium: 137 mmol/L (ref 135–145)

## 2019-03-09 LAB — RENAL FUNCTION PANEL
Albumin: 1.6 g/dL — ABNORMAL LOW (ref 3.5–5.0)
Anion gap: 15 (ref 5–15)
BUN: 77 mg/dL — ABNORMAL HIGH (ref 8–23)
CO2: 25 mmol/L (ref 22–32)
Calcium: 7.6 mg/dL — ABNORMAL LOW (ref 8.9–10.3)
Chloride: 99 mmol/L (ref 98–111)
Creatinine, Ser: 3.98 mg/dL — ABNORMAL HIGH (ref 0.61–1.24)
GFR calc Af Amer: 17 mL/min — ABNORMAL LOW (ref >60)
GFR calc non Af Amer: 15 mL/min — ABNORMAL LOW (ref >60)
Glucose, Bld: 284 mg/dL — ABNORMAL HIGH (ref 70–99)
Phosphorus: 6.2 mg/dL — ABNORMAL HIGH (ref 2.5–4.6)
Potassium: 4.1 mmol/L (ref 3.5–5.1)
Sodium: 139 mmol/L (ref 135–145)

## 2019-03-09 LAB — CBC
HCT: 33.3 % — ABNORMAL LOW (ref 39.0–52.0)
Hemoglobin: 10.9 g/dL — ABNORMAL LOW (ref 13.0–17.0)
MCH: 27.1 pg (ref 26.0–34.0)
MCHC: 32.7 g/dL (ref 30.0–36.0)
MCV: 82.8 fL (ref 80.0–100.0)
Platelets: 327 10*3/uL (ref 150–400)
RBC: 4.02 MIL/uL — ABNORMAL LOW (ref 4.22–5.81)
RDW: 16.3 % — ABNORMAL HIGH (ref 11.5–15.5)
WBC: 38.2 10*3/uL — ABNORMAL HIGH (ref 4.0–10.5)
nRBC: 0 % (ref 0.0–0.2)

## 2019-03-09 LAB — CULTURE, BLOOD (ROUTINE X 2)
Culture: NO GROWTH
Culture: NO GROWTH
Special Requests: ADEQUATE
Special Requests: ADEQUATE

## 2019-03-09 LAB — GLUCOSE, CAPILLARY
Glucose-Capillary: 251 mg/dL — ABNORMAL HIGH (ref 70–99)
Glucose-Capillary: 284 mg/dL — ABNORMAL HIGH (ref 70–99)
Glucose-Capillary: 340 mg/dL — ABNORMAL HIGH (ref 70–99)
Glucose-Capillary: 355 mg/dL — ABNORMAL HIGH (ref 70–99)

## 2019-03-09 LAB — MAGNESIUM: Magnesium: 2.2 mg/dL (ref 1.7–2.4)

## 2019-03-09 NOTE — Progress Notes (Signed)
Name: Colton Lamb MRN: [id number] DOB: 27-Jul-1951     CONSULTATION DATE: 03/04/2019  CHIEF COMPLAINT: Shortness for breath  HISTORY OF PRESENT ILLNESS:   68 y.o male with PMH of  Systolic HF with EF 16% (01/04/29), HTN, PAF (on xarelto), sleep apnea, ischemic cardiomyopathy, CAD, obesity, HLD, GERD presented to ED via EMS on CPAP with AMS and several days of worsening SOB. In ED he was placed on BiPAP and code sepsis activated due to Leukocytosis, lactic acidosis, and hypotension. IV abx initiated.  CXR showed pulmonary edema, ED gave IVF and lasix judiciously as patient was found to have AKI.      OVERNIGHT EVENTS: There was approximately 700 cc of purulent material from the chest tube after instillation of the TPA.  There is no air leak.  There is continued drainage of purulent material at the present time Patient was very confused last night Increased WOB, placed on high flow [place] Has some distress this AM   SIGNIFICANT EVENTS: 8/3 > CT head: Chronic ventriculomegaly without evidence of focal brain insult. No visible change since February 2018. This is felt most likely secondary to central atrophy. Normal pressure hydrocephalus is not excluded but not favored. 8/3 > Admitted to ICU requiring Bipap  8/3 > Levophed initiated for hypotension 8/4> Thoracentesis: 200 mL hazy yellow fluid sent to lab 8/4> Trialysis RIJ placed 8/4 > 1st HD treatment- no UF  8/5> 2nd HD treatment - no UF 8/5> CT placed to drain right pleural empyema 8/6> TPA instilled in CT 8/7> 3rd HD treatment - no UF 8/7> 2nd round of TPA instilled in CT  CULTURES: 8/3 > Blood & Urine cultures- pending, no growth 8/7 8/3 > COVID 19 - negative 8/3 > MRSA PCR- negative 8/3 > Procalcitonin - 27.61 ~ 31.81 ~ 27.22 8/4> Pleural fluid- LD: 5,463 & WBC: 17,693 / gram stain positive for abundant GNR & GPC 8/7> Pleural fluid cytology- moderate viridans streptococcus, holding for possible anaerobe  ANTIBIOTICS: 8/3  > Cefepime 8/3 > Metronidazole x 1 dose, d/c'd 8/3 8/3 > Vancomycin x 2 doses, d/c'd 8/3     Allergies  Allergen Reactions  . Iodine Other (See Comments)    Shortness of breath, swelling and hives  . Shrimp [Shellfish Allergy] Other (See Comments)    SWELLING    HIVES    SHORTNESS OF BREATH  . Tetracycline Rash    REVIEW OF SYSTEMS  PATIENT IS UNABLE TO PROVIDE COMPLETE REVIEW OF SYSTEM S DUE TO SEVERE CRITICAL ILLNESS AND lethargy    VITAL SIGNS: Temp:  [99 F (37.2 C)-99.6 F (37.6 C)] 99.6 F (37.6 C) (08/07 2000) Pulse Rate:  [94-130] 104 (08/08 0745) Resp:  [14-33] 29 (08/08 0745) BP: (68-117)/(43-105) 82/48 (08/08 0745) SpO2:  [90 %-98 %] 97 % (08/08 0803) FiO2 (%):  [32 %-50 %] 40 % (08/08 0803)   I/O last 3 completed shifts: In: 774.5 [P.O.:120; I.V.:311.5; IV Piggyback:343] Out: 2240 [Urine:900; Chest Tube:1340] Total I/O In: 174.2 [I.V.:117.1; IV Piggyback:57.1] Out: -    SpO2: 97 % O2 Flow Rate (L/min): 35 L/min FiO2 (%): 40 %   PHYSICAL EXAMINATION:  GENERAL:critically ill appearing, +resp distress HEAD: Normocephalic, atraumatic.  EYES: Pupils equal, round, reactive to light.  No scleral icterus.  MOUTH: Moist mucosal membrane. NECK: Supple. No thyromegaly. No nodules. No JVD.  PULMONARY: +rhonchi, chest tube in place CARDIOVASCULAR: S1 and S2. Regular rate and rhythm. No murmurs, rubs, or gallops.  GASTROINTESTINAL: Soft, nontender, -distended. No masses. Positive  bowel sounds. No hepatosplenomegaly.  MUSCULOSKELETAL: No swelling, clubbing, or edema.  NEUROLOGIC: lethargic SKIN:intact,warm,dry    I personally reviewed lab work that was obtained in last 24 hrs. CBC    Component Value Date/Time   WBC 38.2 (H) 03/09/2019 0436   RBC 4.02 (L) 03/09/2019 0436   HGB 10.9 (L) 03/09/2019 0436   HGB 13.9 11/22/2013 0301   HCT 33.3 (L) 03/09/2019 0436   HCT 43.3 11/22/2013 0301   PLT 327 03/09/2019 0436   PLT 350 11/22/2013 0301   MCV 82.8  03/09/2019 0436   MCV 81 11/22/2013 0301   MCH 27.1 03/09/2019 0436   MCHC 32.7 03/09/2019 0436   RDW 16.3 (H) 03/09/2019 0436   RDW 16.1 (H) 11/22/2013 0301   LYMPHSABS 0.7 03/04/2019 1414   MONOABS 1.5 (H) 03/04/2019 1414   EOSABS 0.1 03/04/2019 1414   BASOSABS 0.1 03/04/2019 1414   CMP Latest Ref Rng & Units 03/09/2019 03/08/2019 03/08/2019  Glucose 70 - 99 mg/dL 284(H) 265(H) 346(H)  BUN 8 - 23 mg/dL 77(H) 72(H) 90(H)  Creatinine 0.61 - 1.24 mg/dL 3.98(H) 3.55(H) 4.23(H)  Sodium 135 - 145 mmol/L 139 137 134(L)  Potassium 3.5 - 5.1 mmol/L 4.1 3.9 4.1  Chloride 98 - 111 mmol/L 99 98 97(L)  CO2 22 - 32 mmol/L 25 24 23   Calcium 8.9 - 10.3 mg/dL 7.6(L) 7.6(L) 7.6(L)  Total Protein 6.5 - 8.1 g/dL - - 5.5(L)  Total Bilirubin 0.3 - 1.2 mg/dL - - 1.0  Alkaline Phos 38 - 126 U/L - - 133(H)  AST 15 - 41 U/L - - 44(H)  ALT 0 - 44 U/L - - 25   Magnesium: 2.3  Phosphorus: 5.5  MEDICATIONS: I have reviewed all medications and confirmed regimen as documented   CULTURE RESULTS   Recent Results (from the past 240 hour(s))  Urine culture     Status: Abnormal   Collection Time: 03/04/19  2:14 PM   Specimen: Urine, Clean Catch  Result Value Ref Range Status   Specimen Description   Final    URINE, CLEAN CATCH Performed at [facility], [address], [place], [place] [address]    Special Requests   Final    NONE Performed at [facility], [address], [place], [place] [address]    Culture (A)  Final    <10,000 COLONIES/mL INSIGNIFICANT GROWTH Performed at [facility], [place] [address], [place], [place] [address]    Report Status 03/06/2019 FINAL  Final  SARS Coronavirus 2 [facility] order, Performed in [facility], [place] hospital lab) Nasopharyngeal Nasopharyngeal Swab     Status: None   Collection Time: 03/04/19  2:17 PM   Specimen: Nasopharyngeal Swab  Result Value Ref Range Status   SARS Coronavirus 2 NEGATIVE NEGATIVE Final    Comment: (NOTE) If result is  NEGATIVE SARS-CoV-2 target nucleic acids are NOT DETECTED. The SARS-CoV-2 RNA is generally detectable in upper and lower  respiratory specimens during the acute phase of infection. The lowest  concentration of SARS-CoV-2 viral copies this assay can detect is 250  copies / mL. A negative result does not preclude SARS-CoV-2 infection  and should not be used as the sole basis for treatment or other  patient management decisions.  A negative result may occur with  improper specimen collection / handling, submission of specimen other  than nasopharyngeal swab, presence of viral mutation(s) within the  areas targeted by this assay, and inadequate number of viral copies  (<250 copies /  mL). A negative result must be combined with clinical  observations, patient history, and epidemiological information. If result is POSITIVE SARS-CoV-2 target nucleic acids are DETECTED. The SARS-CoV-2 RNA is generally detectable in upper and lower  respiratory specimens dur ing the acute phase of infection.  Positive  results are indicative of active infection with SARS-CoV-2.  Clinical  correlation with patient history and other diagnostic information is  necessary to determine patient infection status.  Positive results do  not rule out bacterial infection or co-infection with other viruses. If result is PRESUMPTIVE POSTIVE SARS-CoV-2 nucleic acids MAY BE PRESENT.   A presumptive positive result was obtained on the submitted specimen  and confirmed on repeat testing.  While 2019 novel coronavirus  (SARS-CoV-2) nucleic acids may be present in the submitted sample  additional confirmatory testing may be necessary for epidemiological  and / or clinical management purposes  to differentiate between  SARS-CoV-2 and other Sarbecovirus currently known to infect humans.  If clinically indicated additional testing with an alternate test  methodology [phone]) is advised. The SARS-CoV-2 RNA is generally  detectable  in upper and lower respiratory sp ecimens during the acute  phase of infection. The expected result is Negative. Fact Sheet for Patients:  StrictlyIdeas.no Fact Sheet for Healthcare Providers: BankingDealers.co.za This test is not yet approved or cleared by the [place] FDA and has been authorized for detection and/or diagnosis of SARS-CoV-2 by FDA under an Emergency Use Authorization (EUA).  This EUA will remain in effect (meaning this test can be used) for the duration of the COVID-19 declaration under Section 564(b)(1) of the Act, 21 U.S.C. section 360bbb-3(b)(1), unless the authorization is terminated or revoked sooner. Performed at [facility], [address] [place], [place] [address]   Blood Culture (routine x 2)     Status: None   Collection Time: 03/04/19  2:55 PM   Specimen: BLOOD  Result Value Ref Range Status   Specimen Description BLOOD BLOOD RIGHT HAND  Final   Special Requests   Final    BOTTLES DRAWN AEROBIC AND ANAEROBIC Blood Culture adequate volume   Culture   Final    NO GROWTH 5 DAYS Performed at [facility], [address] [place], [place] [address]    Report Status 03/09/2019 FINAL  Final  MRSA PCR Screening     Status: None   Collection Time: 03/04/19  8:36 PM   Specimen: Nasopharyngeal  Result Value Ref Range Status   MRSA by PCR NEGATIVE NEGATIVE Final    Comment:        The GeneXpert MRSA Assay (FDA approved for NASAL specimens only), is one component of a comprehensive MRSA colonization surveillance program. It is not intended to diagnose MRSA infection nor to guide or monitor treatment for MRSA infections. Performed at [facility], [address] [place], [place] [address]   Blood Culture (routine x 2)     Status: None   Collection Time: 03/04/19 10:01 PM   Specimen: BLOOD  Result Value Ref Range Status   Specimen Description BLOOD RIGHT ASSIST CONTROL   Final   Special Requests   Final    BOTTLES DRAWN AEROBIC AND ANAEROBIC Blood Culture adequate volume   Culture   Final    NO GROWTH 5 DAYS Performed at [facility], [address], [place], [place] [address]    Report Status 03/09/2019 FINAL  Final  Body fluid culture     Status: None (Preliminary result)   Collection  Time: 03/05/19 11:21 AM   Specimen: [facility] Pleural fluid  Result Value Ref Range Status   Specimen Description   Final    PLEURAL Performed at [facility], [address], [place], [place] [address]    Special Requests   Final    NONE Performed at [facility], [address] [place], [place] [address]    Gram Stain   Final    FEW WBC PRESENT, PREDOMINANTLY PMN ABUNDANT GRAM NEGATIVE RODS ABUNDANT GRAM POSITIVE COCCI FEW GRAM VARIABLE ROD    Culture   Final    MODERATE VIRIDANS STREPTOCOCCUS HOLDING FOR POSSIBLE ANAEROBE SUSCEPTIBILITIES TO FOLLOW Performed at [facility], [place] [address], [place], [place] [address]    Report Status PENDING  Incomplete       Indwelling Urinary Catheter continued, requirement due to   Reason to continue Indwelling Urinary Catheter strict Intake/Output monitoring for hemodynamic instability   Central Line/ continued, requirement due to  Reason to continue [place] of central venous pressure or other hemodynamic parameters and poor IV access    ASSESSMENT AND PLAN SYNOPSIS  68 y.o male with resolved hypoxic respiratory failure and septic shock secondary to ongoing lung abscess/empyema and CAP STREP VIRIDANS pneumonia in the presence of acute renal failure requiring hemodialysis.   Empyema/Lung abscess Follow up Dr Genevive Bi Recs TPA x 2 doses Still with purulent drainage  Severe ACUTE Hypoxic and Hypercapnic Respiratory Failure On high flwo [place] High risk for intubation  ACUTE KIDNEY INJURY/Renal Failure -follow chem 7 -follow UO -continue Foley  Catheter-assess need Hd as needed  Septic shock -use vasopressors to keep MAP>65 -ABX as prescribed STREP VIRIDANS EMPYEMA /LUNG ABSCESS -consider stress dose steroids -aggressive IV fluid Resuscitation    CARDIAC ICU monitoring  INFECTIOUS DISEASE -continue antibiotics as prescribed -follow up cultures-strep viridans -follow up ID consultation   GI GI PROPHYLAXIS as indicated  NUTRITIONAL STATUS DIET-->TF's as tolerated Constipation protocol as indicated   ENDO - will use ICU hypoglycemic\Hyperglycemia protocol if needed   ELECTROLYTES -follow labs as needed -replace as needed -pharmacy consultation and following   DVT/GI PRX ordered TRANSFUSIONS AS NEEDED MONITOR FSBS ASSESS the need for LABS as needed   Critical Care Time devoted to patient care services described in this note is 35 minutes.   Overall, patient is critically ill, prognosis is guarded.  Patient with Multiorgan failure and at high risk for cardiac arrest and death.    Corrin Parker, M.D.  [facility]  Medical Director [place] Director [facility] Cardio-Pulmonary Department

## 2019-03-09 NOTE — Progress Notes (Signed)
[facility]  ROUNDING NOTE   Subjective:   Currently on [place] 08/07 0701 - 08/08 0700 In: 425.4 [P.O.:120; I.V.:162.4; IV Piggyback:143] Out: 1520 [Urine:500; Chest Tube:1020]   UOP is improving Able to eat without nausea or vomiting     Objective:  Vital signs in last 24 hours:  Temp:  [99 F (37.2 C)-99.6 F (37.6 C)] 99.6 F (37.6 C) (08/07 2000) Pulse Rate:  [94-130] 104 (08/08 0745) Resp:  [14-33] 29 (08/08 0745) BP: (68-117)/(43-105) 82/48 (08/08 0745) SpO2:  [90 %-98 %] 97 % (08/08 0803) FiO2 (%):  [32 %-50 %] 40 % (08/08 0803)  Weight change:  Filed Weights   03/05/19 1730 03/06/19 1545 03/06/19 1549  Weight: 132.1 kg 129.9 kg 129.9 kg    Intake/Output: I/O last 3 completed shifts: In: 774.5 [P.O.:120; I.V.:311.5; IV Piggyback:343] Out: 2240 [Urine:900; Chest Tube:1340]   Intake/Output this shift:  Total I/O In: 654.2 [P.O.:120; I.V.:117.1; Other:360; IV Piggyback:57.1] Out: -   Physical Exam: General: Critically ill  Head: Normocephalic, atraumatic. Moist oral mucosal membranes  Eyes: Anicteric,    Neck: Supple   Lungs:  Bilateral crackles, right sided rhonchi, diminished bilaterally, 2L [place]  Heart: irregular  Abdomen:  Soft, nontender, obese  Extremities:  trace peripheral edema.  Neurologic: Nonfocal, moving all four extremities  Skin: No lesions  Access: Right IJ temp HD catheter 8/4  Right chest tube in place  Basic Metabolic Panel: Recent Labs  Lab 03/06/19 0437 03/07/19 0607 03/07/19 2104 03/08/19 0444 03/08/19 2349 03/09/19 0436  NA 135 138 137 134* 137 139  K 4.7 4.4 4.1 4.1 3.9 4.1  CL 100 101 98 97* 98 99  CO2 19* 20* 24 23 24 25   GLUCOSE 197* 260* 244* 346* 265* 284*  BUN 97* 100* 73* 90* 72* 77*  CREATININE 5.19* 4.94* 3.62* 4.23* 3.55* 3.98*  CALCIUM 7.9* 7.8* 7.8* 7.6* 7.6* 7.6*  MG  --   --  2.2 2.3 2.2  --   PHOS 8.3* 6.9* 4.3 5.5*  --  6.2*    Liver Function Tests: Recent Labs  Lab 03/06/19 0437  03/07/19 0607 03/08/19 0444 03/09/19 0436  AST  --   --  44*  --   ALT  --   --  25  --   ALKPHOS  --   --  133*  --   BILITOT  --   --  1.0  --   PROT  --   --  5.5*  --   ALBUMIN 2.3* 1.9* 1.7* 1.6*   No results for input(s): LIPASE, AMYLASE in the last 168 hours. No results for input(s): AMMONIA in the last 168 hours.  CBC: Recent Labs  Lab 03/04/19 1414 03/05/19 0526 03/06/19 0437 03/07/19 0607 03/08/19 0444 03/09/19 0436  WBC 31.2* 39.0* 40.0* 36.9* 40.1* 38.2*  NEUTROABS 27.8*  --   --   --   --   --   HGB 12.1* 11.8* 12.1* 11.7* 11.2* 10.9*  HCT 37.0* 35.9* 37.0* 35.4* 34.0* 33.3*  MCV 85.1 85.3 83.7 83.1 83.1 82.8  PLT 331 364 443* 381 361 327    Cardiac Enzymes: No results for input(s): CKTOTAL, CKMB, CKMBINDEX, TROPONINI in the last 168 hours.  BNP: Invalid input(s): POCBNP  CBG: Recent Labs  Lab 03/07/19 1937 03/08/19 0745 03/08/19 1640 03/08/19 2150 03/09/19 0740  GLUCAP 189* 336* 197* 240* 251*    Microbiology: Results for orders placed or performed during the hospital encounter of 03/04/19  Urine culture  Status: Abnormal   Collection Time: 03/04/19  2:14 PM   Specimen: Urine, Clean Catch  Result Value Ref Range Status   Specimen Description   Final    URINE, CLEAN CATCH Performed at [facility], [address] [place], [place] [address]    Special Requests   Final    NONE Performed at [facility], [place], [address] [place], [place] [address]    Culture (A)  Final    <10,000 COLONIES/mL INSIGNIFICANT GROWTH Performed at [facility] [address], [place], [place] [address]    Report Status 03/06/2019 FINAL  Final  SARS Coronavirus 2 [facility] order, Performed in [facility] hospital lab) Nasopharyngeal Nasopharyngeal Swab     Status: None   Collection Time: 03/04/19  2:17 PM   Specimen: Nasopharyngeal Swab  Result Value Ref Range Status   SARS Coronavirus 2 NEGATIVE NEGATIVE Final    Comment:  (NOTE) If result is NEGATIVE SARS-CoV-2 target nucleic acids are NOT DETECTED. The SARS-CoV-2 RNA is generally detectable in upper and lower  respiratory specimens during the acute phase of infection. The lowest  concentration of SARS-CoV-2 viral copies this assay can detect is 250  copies / mL. A negative result does not preclude SARS-CoV-2 infection  and should not be used as the sole basis for treatment or other  patient management decisions.  A negative result may occur with  improper specimen collection / handling, submission of specimen other  than nasopharyngeal swab, presence of viral mutation(s) within the  areas targeted by this assay, and inadequate number of viral copies  (<250 copies / mL). A negative result must be combined with clinical  observations, patient history, and epidemiological information. If result is POSITIVE SARS-CoV-2 target nucleic acids are DETECTED. The SARS-CoV-2 RNA is generally detectable in upper and lower  respiratory specimens dur ing the acute phase of infection.  Positive  results are indicative of active infection with SARS-CoV-2.  Clinical  correlation with patient history and other diagnostic information is  necessary to determine patient infection status.  Positive results do  not rule out bacterial infection or co-infection with other viruses. If result is PRESUMPTIVE POSTIVE SARS-CoV-2 nucleic acids MAY BE PRESENT.   A presumptive positive result was obtained on the submitted specimen  and confirmed on repeat testing.  While 2019 novel coronavirus  (SARS-CoV-2) nucleic acids may be present in the submitted sample  additional confirmatory testing may be necessary for epidemiological  and / or clinical management purposes  to differentiate between  SARS-CoV-2 and other Sarbecovirus currently known to infect humans.  If clinically indicated additional testing with an alternate test  methodology [phone]) is advised. The SARS-CoV-2 RNA is  generally  detectable in upper and lower respiratory sp ecimens during the acute  phase of infection. The expected result is Negative. Fact Sheet for Patients:  StrictlyIdeas.no Fact Sheet for Healthcare Providers: BankingDealers.co.za This test is not yet approved or cleared by the [place] FDA and has been authorized for detection and/or diagnosis of SARS-CoV-2 by FDA under an Emergency Use Authorization (EUA).  This EUA will remain in effect (meaning this test can be used) for the duration of the COVID-19 declaration under Section 564(b)(1) of the Act, 21 U.S.C. section 360bbb-3(b)(1), unless the authorization is terminated or revoked sooner. Performed at [facility], [address] [place], [place] [address]   Blood Culture (routine x 2)     Status: None   Collection Time: 03/04/19  2:55 PM  Specimen: BLOOD  Result Value Ref Range Status   Specimen Description BLOOD BLOOD RIGHT HAND  Final   Special Requests   Final    BOTTLES DRAWN AEROBIC AND ANAEROBIC Blood Culture adequate volume   Culture   Final    NO GROWTH 5 DAYS Performed at [facility], [address] [place], [place] [address]    Report Status 03/09/2019 FINAL  Final  MRSA PCR Screening     Status: None   Collection Time: 03/04/19  8:36 PM   Specimen: Nasopharyngeal  Result Value Ref Range Status   MRSA by PCR NEGATIVE NEGATIVE Final    Comment:        The GeneXpert MRSA Assay (FDA approved for NASAL specimens only), is one component of a comprehensive MRSA colonization surveillance program. It is not intended to diagnose MRSA infection nor to guide or monitor treatment for MRSA infections. Performed at [facility], [address] [place], [place] [address]   Blood Culture (routine x 2)     Status: None   Collection Time: 03/04/19 10:01 PM   Specimen: BLOOD  Result Value Ref Range Status   Specimen Description  BLOOD RIGHT ASSIST CONTROL  Final   Special Requests   Final    BOTTLES DRAWN AEROBIC AND ANAEROBIC Blood Culture adequate volume   Culture   Final    NO GROWTH 5 DAYS Performed at [facility], [address], [place], [place] [address]    Report Status 03/09/2019 FINAL  Final  Body fluid culture     Status: None (Preliminary result)   Collection Time: 03/05/19 11:21 AM   Specimen: [facility] Cytology Pleural fluid  Result Value Ref Range Status   Specimen Description   Final    PLEURAL Performed at [facility], [address], [place], [place] [address]    Special Requests   Final    NONE Performed at [facility], [address], [place], [place] [address]    Gram Stain   Final    FEW WBC PRESENT, PREDOMINANTLY PMN ABUNDANT GRAM NEGATIVE RODS ABUNDANT GRAM POSITIVE COCCI FEW GRAM VARIABLE ROD    Culture   Final    MODERATE VIRIDANS STREPTOCOCCUS HOLDING FOR POSSIBLE ANAEROBE CULTURE REINCUBATED FOR BETTER GROWTH Performed at [facility], [place] [address], [place],  [address]    Report Status PENDING  Incomplete    Coagulation Studies: No results for input(s): LABPROT, INR in the last 72 hours.  Urinalysis: No results for input(s): COLORURINE, LABSPEC, PHURINE, GLUCOSEU, HGBUR, BILIRUBINUR, KETONESUR, PROTEINUR, UROBILINOGEN, NITRITE, LEUKOCYTESUR in the last 72 hours.  Invalid input(s): APPERANCEUR    Imaging: Ct Head Wo Contrast  Result Date: 03/09/2019 CLINICAL DATA:  Shortness of breath. Encephalopathy. Under treatment for pneumonia. EXAM: CT HEAD WITHOUT CONTRAST TECHNIQUE: Contiguous axial images were obtained from the base of the skull through the vertex without intravenous contrast. COMPARISON:  03/04/2019 FINDINGS: Brain: There is no mass, hemorrhage or extra-axial collection. There is generalized atrophy without lobar predilection. Hypodensity of the white matter is most commonly associated with chronic microvascular  disease. Vascular: No abnormal hyperdensity of the major intracranial arteries or dural venous sinuses. No intracranial atherosclerosis. Skull: The visualized skull base, calvarium and extracranial soft tissues are normal. Sinuses/Orbits: No fluid levels or advanced mucosal thickening of the visualized paranasal sinuses. No mastoid or middle ear effusion. The orbits are normal. IMPRESSION: Parenchymal volume loss greater than expected for age. No acute abnormality. Electronically Signed  By: Ulyses Jarred M.D.   On: 03/09/2019 00:57   Dg Chest Port 1 View  Result Date: 03/08/2019 CLINICAL DATA:  Respiratory distress EXAM: PORTABLE CHEST 1 VIEW COMPARISON:  03/05/2019 FINDINGS: Cardiac shadow remains enlarged. Defibrillator is again seen. Right jugular temporary dialysis catheter is noted. New pigtail catheter is noted in the right hemithorax with slight decrease in the degree of right-sided pleural effusion. No pneumothorax is noted. Mild vascular congestion is noted. IMPRESSION: Slight decrease in right-sided pleural effusion. Mild vascular congestion is noted. Electronically Signed   By: Inez Catalina M.D.   On: 03/08/2019 23:57     Medications:   . sodium chloride Stopped (03/06/19 1411)  . ampicillin-sulbactam (UNASYN) IV Stopped (03/08/19 2017)  . norepinephrine (LEVOPHED) Adult infusion 15 mcg/min (03/09/19 0753)   . aspirin EC  81 mg Oral Daily  . atorvastatin  80 mg Oral Daily  . budesonide (PULMICORT) nebulizer solution  0.25 mg Nebulization BID  . Chlorhexidine Gluconate Cloth  6 each Topical Q0600  . heparin  5,000 Units Subcutaneous Q8H  . hydrocortisone sod succinate (SOLU-CORTEF) inj  50 mg Intravenous Q6H  . insulin aspart  0-9 Units Subcutaneous TID AC & HS  . ipratropium-albuterol  3 mL Nebulization Q4H  . lactobacillus  1 g Oral TID WC  . midodrine  5 mg Oral TID WC  . niacin  1,000 mg Oral QHS  . pantoprazole (PROTONIX) IV  40 mg Intravenous Daily   sodium chloride,  acetaminophen **OR** acetaminophen, albuterol, morphine injection, nitroGLYCERIN, ondansetron **OR** ondansetron (ZOFRAN) IV, polyethylene glycol  Assessment/ Plan:  Mr. RODRECUS BELSKY is a 68 y.o. white male with diabetes mellitus type II, hypertension, obstructive sleep apnea, congestive heart failure, atrial fibrillation, hyperlipidemia, Crohn's disease, coronary artery disease, GERD, pacemaker, morbid obesity,  , who was admitted to [facility] on acute exacerbation of COPD, acute exacerbation of congestive heart failure, right pleural effusion, acute respiratory failure requiring BIPAP, sepsis with pneumonia and acute renal failure requiring hemodialysis   1. Acute renal failure: baseline creatinine of 1.1, normal GFR on 01/02/2019.  wiith underlying diabetic nephropathy with glucosuria.  - non-oliguric, BUN creatinine remain elevated - Electrolyte and volume status are acceptable today.  No acute indication for dialysis but will continue to monitor daily  2. Acute respiratory failure:   Secondary to pneumonia, congestive heart failure and COPD exacerbation And now has chest tube placed via CT guidance on 03/06/2019 Requiring O2 supplementation with [place]   3. Acute exacerbation of systolic congestive heart failure:  7/10 echocardiogram 35-40% Volume status is acceptable   4. Sepsis with hypotension: cultures pending. G pos and G Neg organsims, strep viridans Chest tube unasyn   LOS: 5 Nikkia Devoss 8/8/202010:09 AM

## 2019-03-09 NOTE — Progress Notes (Signed)
[place] [facility] [place] NAME: Colton Lamb    MR#:  [id number]  DATE OF BIRTH:  11/07/1950  SUBJECTIVE:   No acute events overnight, remains on vasopressors, denies any chest pain, worsening shortness of breath.  REVIEW OF SYSTEMS:    Review of Systems  Constitutional: Negative for chills and fever.  HENT: Negative for congestion and tinnitus.   Eyes: Negative for blurred vision and double vision.  Respiratory: Negative for cough, shortness of breath and wheezing.   Cardiovascular: Negative for chest pain, orthopnea and PND.  Gastrointestinal: Negative for abdominal pain, diarrhea, nausea and vomiting.  Genitourinary: Negative for dysuria and hematuria.  Neurological: Positive for weakness (generalized). Negative for dizziness, sensory change and focal weakness.  All other systems reviewed and are negative.   Nutrition: Heart Healthy/Carb control Tolerating Diet: Yes Tolerating PT: Await Eval.      DRUG ALLERGIES:   Allergies  Allergen Reactions  . Iodine Other (See Comments)    Shortness of breath, swelling and hives  . Shrimp [Shellfish Allergy] Other (See Comments)    SWELLING    HIVES    SHORTNESS OF BREATH  . Tetracycline Rash    VITALS:  Blood pressure (!) 143/118, pulse (!) 101, temperature 99.6 F (37.6 C), temperature source Axillary, resp. rate (!) 22, height 6' (1.829 m), weight 129.9 kg, SpO2 95 %.  PHYSICAL EXAMINATION:   Physical Exam  GENERAL:  68 y.o.-year-old obese patient lying in bed in no acute distress.  EYES: Pupils equal, round, reactive to light and accommodation. No scleral icterus. Extraocular muscles intact.  HEENT: Head atraumatic, normocephalic. Oropharynx and nasopharynx clear.  NECK:  Supple, no jugular venous distention. No thyroid enlargement, no tenderness.  LUNGS: Good a/e B/l, no wheezing, rales, rhonchi. No use of accessory muscles of respiration. Right sided CT in place with some  pus drainage noted.   CARDIOVASCULAR: S1, S2 normal. No murmurs, rubs, or gallops.  ABDOMEN: Soft, nontender, nondistended. Bowel sounds present. No organomegaly or mass.  EXTREMITIES: No cyanosis, clubbing or edema b/l.    NEUROLOGIC: Cranial nerves II through XII are intact. No focal Motor or sensory deficits b/l. Globally weak.    PSYCHIATRIC: The patient is alert and oriented x 3.  SKIN: No obvious rash, lesion, or ulcer.    LABORATORY PANEL:   CBC Recent Labs  Lab 03/09/19 0436  WBC 38.2*  HGB 10.9*  HCT 33.3*  PLT 327   ------------------------------------------------------------------------------------------------------------------  Chemistries  Recent Labs  Lab 03/08/19 0444 03/08/19 2349 03/09/19 0436  NA 134* 137 139  K 4.1 3.9 4.1  CL 97* 98 99  CO2 23 24 25   GLUCOSE 346* 265* 284*  BUN 90* 72* 77*  CREATININE 4.23* 3.55* 3.98*  CALCIUM 7.6* 7.6* 7.6*  MG 2.3 2.2  --   AST 44*  --   --   ALT 25  --   --   ALKPHOS 133*  --   --   BILITOT 1.0  --   --    ------------------------------------------------------------------------------------------------------------------  Cardiac Enzymes No results for input(s): TROPONINI in the last 168 hours. ------------------------------------------------------------------------------------------------------------------  RADIOLOGY:  Ct Head Wo Contrast  Result Date: 03/09/2019 CLINICAL DATA:  Shortness of breath. Encephalopathy. Under treatment for pneumonia. EXAM: CT HEAD WITHOUT CONTRAST TECHNIQUE: Contiguous axial images were obtained from the base of the skull through the vertex without intravenous contrast. COMPARISON:  03/04/2019 FINDINGS: Brain: There is no mass, hemorrhage or extra-axial collection. There is  generalized atrophy without lobar predilection. Hypodensity of the white matter is most commonly associated with chronic microvascular disease. Vascular: No abnormal hyperdensity of the major intracranial  arteries or dural venous sinuses. No intracranial atherosclerosis. Skull: The visualized skull base, calvarium and extracranial soft tissues are normal. Sinuses/Orbits: No fluid levels or advanced mucosal thickening of the visualized paranasal sinuses. No mastoid or middle ear effusion. The orbits are normal. IMPRESSION: Parenchymal volume loss greater than expected for age. No acute abnormality. Electronically Signed   By: Ulyses Jarred M.D.   On: 03/09/2019 00:57   Dg Chest Port 1 View  Result Date: 03/09/2019 CLINICAL DATA:  Postoperative EXAM: PORTABLE CHEST 1 VIEW COMPARISON:  Chest radiograph from one day prior. FINDINGS: Right pigtail chest tube, right internal jugular central venous catheter and 2 lead left subclavian ICD are stable in configuration. Stable cardiomediastinal silhouette with mild cardiomegaly. No pneumothorax. Small bilateral pleural effusions are stable. Cephalization of the pulmonary vasculature without overt pulmonary edema. Stable bibasilar atelectasis. IMPRESSION: No pneumothorax. Stable chest radiograph with cardiomegaly, no overt pulmonary edema and small bilateral pleural effusions with bibasilar atelectasis. Electronically Signed   By: Ilona Sorrel M.D.   On: 03/09/2019 10:56   Dg Chest Port 1 View  Result Date: 03/08/2019 CLINICAL DATA:  Respiratory distress EXAM: PORTABLE CHEST 1 VIEW COMPARISON:  03/05/2019 FINDINGS: Cardiac shadow remains enlarged. Defibrillator is again seen. Right jugular temporary dialysis catheter is noted. New pigtail catheter is noted in the right hemithorax with slight decrease in the degree of right-sided pleural effusion. No pneumothorax is noted. Mild vascular congestion is noted. IMPRESSION: Slight decrease in right-sided pleural effusion. Mild vascular congestion is noted. Electronically Signed   By: Inez Catalina M.D.   On: 03/08/2019 23:57     ASSESSMENT AND PLAN:   68 year old male with past medical history of paroxysmal atrial  fibrillation, obstructive sleep apnea, diabetes, obesity, hypertension, hyperlipidemia, GERD, history of coronary artery disease, history of chronic systolic CHF who presented to the hospital due to worsening shortness of breath.  1.  Sepsis/septic shock- secondary to pneumonia with empyema. - Continue IV fluids, vasopressor support, midodrine, IV Solu-Cortef - Follow fever curve, continue broad-spectrum IV antibiotics with Unasyn, Flagyl for now.  2.  Acute respiratory failure with hypoxia-secondary to pneumonia with empyema. -Seen by general surgery, status post chest tube placement on the right.  Body fluid cultures are consistent with strep viridans. -Continue Unasyn, Flagyl.  Continue chest tube care as per general surgery. - Was on high flow nasal cannula now weaned to just nasal cannula.  Continue further care as per pulmonary with pulmonary toileting.  3.  Acute renal failure-patient's baseline creatinines around 1.1 and now elevated to over 3.5. -Seen by nephrology, patient is nonoliguric, no acute indication for dialysis. - Continue supportive care with IV fluids, vasopressors, follow BUN/creatinine urine output.  Renal dose meds, avoid nephrotoxins.  This is likely ATN from underlying sepsis.  4.  Diabetes type 2 without complication- continue sliding scale insulin, follow blood sugars.  5.  Leukocytosis- secondary to underlying sepsis/pneumonia/empyema complicated with use of steroids. -Follow white cell count.  6.  Hyperlipidemia-continue atorvastatin.  7.  COPD-no acute exacerbation.  Continue duo nebs, albuterol nebs.  All the records are reviewed and case discussed with Care Management/Social Worker. Management plans discussed with the patient, family and they are in agreement.  CODE STATUS: Full code  DVT Prophylaxis: Heparin subcu  TOTAL TIME TAKING CARE OF THIS PATIENT: 30 minutes.   POSSIBLE D/C unclear  DAYS, DEPENDING ON CLINICAL CONDITION and progress   Henreitta Leber M.D on 03/09/2019 at 4:28 PM  Between 7am to 6pm - Pager - [phone]  After 6pm go to [email or web address] - Proofreader  [facility] [place] [facility]  Office  [phone]  CC: Primary care physician; Toni Arthurs, NP

## 2019-03-09 NOTE — Progress Notes (Signed)
  Patient ID: Colton Lamb, male   DOB: 03/21/1951, 68 y.o.   MRN: [id number]  HISTORY: He is awake and alert today.  He has no complaints.   Vitals:   03/09/19 0745 03/09/19 0803  BP: (!) 82/48   Pulse: (!) 104   Resp: (!) 29   Temp:    SpO2: 98% 97%     EXAM:    Resp: Lungs are decreased bilaterally.  No respiratory distress, normal effort. Heart:  Regular without murmurs Abd:  Abdomen is soft, non distended and non tender. No masses are palpable.  There is no rebound and no guarding.  Neurological: Alert and oriented to person, place, and time. Coordination normal.  Skin: Skin is warm and dry. No rash noted. No diaphoretic. No erythema. No pallor.  Psychiatric: Normal mood and affect. Normal behavior. Judgment and thought content normal.   There was approximately 700 cc of purulent material from the chest tube after instillation of the TPA.  There is no air leak.  There is continued drainage of purulent material at the present time  Independent review of his chest x-ray today shows continued haziness at the right base.  There is no pneumothorax obvious.   ASSESSMENT: Right-sided empyema secondary to pneumonia with multisystem organ failure   PLAN:   I had a long discussion today with Colton Lamb about the seriousness of his problem.  Decortication of the lung would offer his best long-term results but he is a very poor risk surgical candidate given his current medical problems.  I will attempt to contact his son today to review with him the options.  I did explain to Colton Lamb that a thoracotomy carry significant risks including risks to his life.  He understands and would like me to contact his family.    Colton Lamb, MDPatient ID: Colton Lamb, male   DOB: May 15, 1951, 68 y.o.   MRN: [id number]

## 2019-03-10 LAB — CBC
HCT: 33.2 % — ABNORMAL LOW (ref 39.0–52.0)
Hemoglobin: 10.7 g/dL — ABNORMAL LOW (ref 13.0–17.0)
MCH: 27.2 pg (ref 26.0–34.0)
MCHC: 32.2 g/dL (ref 30.0–36.0)
MCV: 84.5 fL (ref 80.0–100.0)
Platelets: 316 10*3/uL (ref 150–400)
RBC: 3.93 MIL/uL — ABNORMAL LOW (ref 4.22–5.81)
RDW: 16.2 % — ABNORMAL HIGH (ref 11.5–15.5)
WBC: 33.2 10*3/uL — ABNORMAL HIGH (ref 4.0–10.5)
nRBC: 0 % (ref 0.0–0.2)

## 2019-03-10 LAB — COMPREHENSIVE METABOLIC PANEL
ALT: 27 U/L (ref 0–44)
AST: 39 U/L (ref 15–41)
Albumin: 1.6 g/dL — ABNORMAL LOW (ref 3.5–5.0)
Alkaline Phosphatase: 118 U/L (ref 38–126)
Anion gap: 13 (ref 5–15)
BUN: 102 mg/dL — ABNORMAL HIGH (ref 8–23)
CO2: 24 mmol/L (ref 22–32)
Calcium: 7.6 mg/dL — ABNORMAL LOW (ref 8.9–10.3)
Chloride: 99 mmol/L (ref 98–111)
Creatinine, Ser: 3.99 mg/dL — ABNORMAL HIGH (ref 0.61–1.24)
GFR calc Af Amer: 17 mL/min — ABNORMAL LOW (ref >60)
GFR calc non Af Amer: 15 mL/min — ABNORMAL LOW (ref >60)
Glucose, Bld: 307 mg/dL — ABNORMAL HIGH (ref 70–99)
Potassium: 3.4 mmol/L — ABNORMAL LOW (ref 3.5–5.1)
Sodium: 136 mmol/L (ref 135–145)
Total Bilirubin: 0.9 mg/dL (ref 0.3–1.2)
Total Protein: 5.4 g/dL — ABNORMAL LOW (ref 6.5–8.1)

## 2019-03-10 LAB — GLUCOSE, CAPILLARY
Glucose-Capillary: 237 mg/dL — ABNORMAL HIGH (ref 70–99)
Glucose-Capillary: 332 mg/dL — ABNORMAL HIGH (ref 70–99)
Glucose-Capillary: 374 mg/dL — ABNORMAL HIGH (ref 70–99)
Glucose-Capillary: 383 mg/dL — ABNORMAL HIGH (ref 70–99)

## 2019-03-10 LAB — PHOSPHORUS: Phosphorus: 7.6 mg/dL — ABNORMAL HIGH (ref 2.5–4.6)

## 2019-03-10 LAB — MAGNESIUM: Magnesium: 2.6 mg/dL — ABNORMAL HIGH (ref 1.7–2.4)

## 2019-03-10 MED ORDER — MIDODRINE HCL 5 MG PO TABS
10.0000 mg | ORAL_TABLET | Freq: Three times a day (TID) | ORAL | Status: DC
  Administered 2019-03-10 – 2019-03-15 (×17): 10 mg via ORAL
  Filled 2019-03-10 (×18): qty 2

## 2019-03-10 NOTE — Progress Notes (Signed)
  Patient ID: Colton Lamb, male   DOB: 1950-11-23, 68 y.o.   MRN: [id number]  HISTORY: Had a very good night.  Slept very soundly.  Not short of breath.  No complaints of pain.  Very awake, alert and interactive   Vitals:   03/10/19 0600 03/10/19 0700  BP: 119/73 97/61  Pulse: 100 98  Resp: (!) 23 (!) 26  Temp:    SpO2: 97% 97%     EXAM:    Resp: Lungs are clear bilaterally.  No respiratory distress, normal effort. Heart:  Regular without murmurs Abd:  Abdomen is soft, non distended and non tender. No masses are palpable.  There is no rebound and no guarding.  Neurological: Alert and oriented to person, place, and time. Coordination normal.  Skin: Skin is warm and dry. No rash noted. No diaphoretic. No erythema. No pallor.  Psychiatric: Normal mood and affect. Normal behavior. Judgment and thought content normal.   The chest drain was about 500 cc yesterday of pure purulent fluid   ASSESSMENT: Right chest empyema.     PLAN:   Will need ct scan of the chest to assess the progress of therapy Though he continues to slowly improve, surgery will be of higher risk. Leave chest tube to 20 cm of water. Would be helpful if he could tolerate physical therapy or out of bed to chair    [name], MDPatient ID: Colton Lamb, male   DOB: 08/08/1950, 68 y.o.   MRN: [id number]

## 2019-03-10 NOTE — Progress Notes (Signed)
New MAP goal 55.  History of low BP since his skin cancer was removed a while back.    New cuff placed on other arm to improve reading.  Phillis Knack, RN

## 2019-03-10 NOTE — Progress Notes (Signed)
CRITICAL CARE NOTE 68 y.o male with PMH of  Systolic HF with EF 33% (3/83/29), HTN, PAF (on xarelto), sleep apnea, ischemic cardiomyopathy, CAD, obesity, HLD, GERD presented to ED via EMS on CPAP with AMS and several days of worsening SOB. In ED he was placed on BiPAP and code sepsis activated due to Leukocytosis, lactic acidosis, and hypotension. IV abx initiated.  CXR showed pulmonary edema, ED gave IVF and lasix judiciously as patient was found to have AKI.      CC  follow up respiratory failure  SUBJECTIVE Prognosis is guarded Remains on vasopressors [place] oxygen  Chest tube in place purulent drainage noted  BP 97/61   Pulse 98   Temp 98.2 F (36.8 C) (Oral)   Resp (!) 26   Ht 6' (1.829 m)   Wt 129.9 kg   SpO2 97%   BMI 38.84 kg/m    I/O last 3 completed shifts: In: 1689.3 [P.O.:600; I.V.:429.2; Other:360; IV Piggyback:300.1] Out: 2655 [Urine:1305; Chest Tube:1350] No intake/output data recorded.  SpO2: 97 % O2 Flow Rate (L/min): 4 L/min FiO2 (%): 40 %  SIGNIFICANT EVENTS: 8/3 >CT head: Chronic ventriculomegaly without evidence of focal brain insult. No visible change since February 2018. This is felt most likely secondary to central atrophy. Normal pressure hydrocephalus is not excluded but not favored. 8/3 >Admitted to ICU requiring Bipap  8/3 > Levophed initiated for hypotension 8/4> Thoracentesis: 200 mL hazy yellow fluid sent to lab 8/4> Trialysis RIJ placed 8/4 > 1st HD treatment- no UF 8/5> 2nd HD treatment - no UF 8/5> CT placed to drain right pleural empyema 8/6> TPA instilled in CT 8/7> 3rd HD treatment - no UF 8/7> 2nd round of TPA instilled in CT  CULTURES: 8/3 > Blood & Urine cultures- pending, no growth 8/7 8/3 > COVID 19 - negative 8/3 > MRSA PCR- negative 8/3 > Procalcitonin - 27.61 ~ 31.81 ~ 27.22 8/4> Pleural fluid- LD: 5,463 & WBC: 17,693/ gram stain positive for abundant GNR & GPC 8/7> Pleural fluid cytology- moderate viridans  streptococcus, holding for possible anaerobe  ANTIBIOTICS: 8/3 > Cefepime 8/3 > Metronidazole x 1 dose, d/c'd 8/3 8/3 > Vancomycin x 2 doses, d/c'd 8/3    Review of Systems:  Gen:  Denies  fever, sweats, chills weight loss  HEENT: Denies blurred vision, double vision, ear pain, eye pain, hearing loss, nose bleeds, sore throat Cardiac:  No dizziness, chest pain or heaviness, chest tightness,edema, No JVD Resp:   No cough, -sputum production, -shortness of breath,-wheezing, -hemoptysis,  Gi: Denies swallowing difficulty, stomach pain, nausea or vomiting, diarrhea, constipation, bowel incontinence Gu:  Denies bladder incontinence, burning urine Ext:   Denies Joint pain, stiffness or swelling Skin: Denies  skin rash, easy bruising or bleeding or hives Endoc:  Denies polyuria, polydipsia , polyphagia or weight change Psych:   Denies depression, insomnia or hallucinations  Other:  All other systems negative   Physical Examination:   GENERAL:NAD, no fevers, chills, no weakness no fatigue HEAD: Normocephalic, atraumatic.  NECK: Supple. No thyromegaly.  No JVD.  PULMONARY: CTA B/L no wheezing, rhonchi, crackles CARDIOVASCULAR: S1 and S2. Regular rate and rhythm. No murmurs GASTROINTESTINAL: Soft, nontender, nondistended. Positive bowel sounds.  MUSCULOSKELETAL: No swelling, clubbing, or edema.  NEUROLOGIC: No gross focal neurological deficits. 5/5 strength all extremities SKIN: No ulceration, lesions, rashes, or cyanosis.  PSYCHIATRIC: Insight, judgment intact. -depression -anxiety ALL OTHER ROS ARE NEGATIVE      MEDICATIONS: I have reviewed all medications and confirmed regimen  as documented   CULTURE RESULTS   Recent Results (from the past 240 hour(s))  Urine culture     Status: Abnormal   Collection Time: 03/04/19  2:14 PM   Specimen: Urine, Clean Catch  Result Value Ref Range Status   Specimen Description   Final    URINE, CLEAN CATCH Performed at [facility], [address] [place], [place] [address]    Special Requests   Final    NONE Performed at [facility], [address] [place], [place] [address]    Culture (A)  Final    <10,000 COLONIES/mL INSIGNIFICANT GROWTH Performed at [facility] [address] [place], [place] [address]    Report Status 03/06/2019 FINAL  Final  SARS Coronavirus 2 [facility] order, Performed in [facility] hospital lab) Nasopharyngeal Nasopharyngeal Swab     Status: None   Collection Time: 03/04/19  2:17 PM   Specimen: Nasopharyngeal Swab  Result Value Ref Range Status   SARS Coronavirus 2 NEGATIVE NEGATIVE Final    Comment: (NOTE) If result is NEGATIVE SARS-CoV-2 target nucleic acids are NOT DETECTED. The SARS-CoV-2 RNA is generally detectable in upper and lower  respiratory specimens during the acute phase of infection. The lowest  concentration of SARS-CoV-2 viral copies this assay can detect is 250  copies / mL. A negative result does not preclude SARS-CoV-2 infection  and should not be used as the sole basis for treatment or other  patient management decisions.  A negative result may occur with  improper specimen collection / handling, submission of specimen other  than nasopharyngeal swab, presence of viral mutation(s) within the  areas targeted by this assay, and inadequate number of viral copies  (<250 copies / mL). A negative result must be combined with clinical  observations, patient history, and epidemiological information. If result is POSITIVE SARS-CoV-2 target nucleic acids are DETECTED. The SARS-CoV-2 RNA is generally detectable in upper and lower  respiratory specimens dur ing the acute phase of infection.  Positive  results are indicative of active infection with SARS-CoV-2.  Clinical  correlation with patient history and other diagnostic information is  necessary to determine patient infection status.  Positive results do  not rule out bacterial infection or  co-infection with other viruses. If result is PRESUMPTIVE POSTIVE SARS-CoV-2 nucleic acids MAY BE PRESENT.   A presumptive positive result was obtained on the submitted specimen  and confirmed on repeat testing.  While 2019 novel coronavirus  (SARS-CoV-2) nucleic acids may be present in the submitted sample  additional confirmatory testing may be necessary for epidemiological  and / or clinical management purposes  to differentiate between  SARS-CoV-2 and other Sarbecovirus currently known to infect humans.  If clinically indicated additional testing with an alternate test  methodology [id number]) is advised. The SARS-CoV-2 RNA is generally  detectable in upper and lower respiratory sp ecimens during the acute  phase of infection. The expected result is Negative. Fact Sheet for Patients:  StrictlyIdeas.no Fact Sheet for Healthcare Providers: BankingDealers.co.za This test is not yet approved or cleared by the [place] FDA and has been authorized for detection and/or diagnosis of SARS-CoV-2 by FDA under an Emergency Use Authorization (EUA).  This EUA will remain in effect (meaning this test can be used) for the duration of the COVID-19 declaration under Section 564(b)(1) of the Act, 21 U.S.C. section 360bbb-3(b)(1), unless the authorization is terminated or revoked sooner. Performed at [facility], [place], [place]  [address]   Blood Culture (routine x 2)     Status: None   Collection Time: 03/04/19  2:55 PM   Specimen: BLOOD  Result Value Ref Range Status   Specimen Description BLOOD BLOOD RIGHT HAND  Final   Special Requests   Final    BOTTLES DRAWN AEROBIC AND ANAEROBIC Blood Culture adequate volume   Culture   Final    NO GROWTH 5 DAYS Performed at [facility], [address] [place], [place] [address]    Report Status 03/09/2019 FINAL  Final  MRSA PCR Screening     Status: None    Collection Time: 03/04/19  8:36 PM   Specimen: Nasopharyngeal  Result Value Ref Range Status   MRSA by PCR NEGATIVE NEGATIVE Final    Comment:        The GeneXpert MRSA Assay (FDA approved for NASAL specimens only), is one component of a comprehensive MRSA colonization surveillance program. It is not intended to diagnose MRSA infection nor to guide or monitor treatment for MRSA infections. Performed at [facility], [address] [place], [place] [address]   Blood Culture (routine x 2)     Status: None   Collection Time: 03/04/19 10:01 PM   Specimen: BLOOD  Result Value Ref Range Status   Specimen Description BLOOD RIGHT ASSIST CONTROL  Final   Special Requests   Final    BOTTLES DRAWN AEROBIC AND ANAEROBIC Blood Culture adequate volume   Culture   Final    NO GROWTH 5 DAYS Performed at [facility], [address], [place], [place] [address]    Report Status 03/09/2019 FINAL  Final  Body fluid culture     Status: None (Preliminary result)   Collection Time: 03/05/19 11:21 AM   Specimen: [facility] Cytology Pleural fluid  Result Value Ref Range Status   Specimen Description   Final    PLEURAL Performed at [facility], [address], [place], [place] [address]    Special Requests   Final    NONE Performed at [facility], [address], [place], [place] [address]    Gram Stain   Final    FEW WBC PRESENT, PREDOMINANTLY PMN ABUNDANT GRAM NEGATIVE RODS ABUNDANT GRAM POSITIVE COCCI FEW GRAM VARIABLE ROD    Culture   Final    MODERATE VIRIDANS STREPTOCOCCUS HOLDING FOR POSSIBLE ANAEROBE CULTURE REINCUBATED FOR BETTER GROWTH Performed at [facility], [place] [address], [place],  [address]    Report Status PENDING  Incomplete          IMAGING    Dg Chest Port 1 View  Result Date: 03/09/2019 CLINICAL DATA:  Postoperative EXAM: PORTABLE CHEST 1 VIEW COMPARISON:  Chest radiograph from one day prior. FINDINGS: Right  pigtail chest tube, right internal jugular central venous catheter and 2 lead left subclavian ICD are stable in configuration. Stable cardiomediastinal silhouette with mild cardiomegaly. No pneumothorax. Small bilateral pleural effusions are stable. Cephalization of the pulmonary vasculature without overt pulmonary edema. Stable bibasilar atelectasis. IMPRESSION: No pneumothorax. Stable chest radiograph with cardiomegaly, no overt pulmonary edema and small bilateral pleural effusions with bibasilar atelectasis. Electronically Signed   By: Ilona Sorrel M.D.   On: 03/09/2019 10:56       Indwelling Urinary Catheter continued, requirement due to   Reason to continue Indwelling Urinary Catheter strict Intake/Output monitoring for hemodynamic instability   Central Line/ continued, requirement due to  Reason to continue [employer] of central  venous pressure or other hemodynamic parameters and poor IV access        ASSESSMENT AND PLAN SYNOPSIS 68 y.o male with resolved hypoxic respiratory failure and septic shock secondary to ongoing lung abscess/empyema and CAP STREP VIRIDANS pneumonia in the presence of acute renal failure requiring hemodialysis.  Empyema/lung abscess Follow up Dr Genevive Bi TPA x 2 doses Still purulent drainage   ACUTE KIDNEY INJURY/Renal Failure -follow chem 7 -follow UO -continue Foley Catheter-assess need -Avoid nephrotoxic agents -Recheck creatinine    SHOCK-SEPSIS STREP VIRIDANS LUNG ABSCESS -use vasopressors to keep MAP>65 -follow ABG and LA -follow up cultures -consider stress dose steroids On midodrine increased 10 tid    ID -continue IV abx as prescibed -follow up cultures  GI GI PROPHYLAXIS as indicated  NUTRITIONAL STATUS DIET-->diet as tolerated Constipation protocol as indicated  ENDO - will use ICU hypoglycemic\Hyperglycemia protocol if indicated   ELECTROLYTES -follow labs as needed -replace as needed -pharmacy consultation  and following   DVT/GI PRX ordered TRANSFUSIONS AS NEEDED MONITOR FSBS ASSESS the need for LABS as needed  SD status  Annelie Boak Patricia Pesa, M.D.  [facility]  Medical Director [facility] Director [place] Department

## 2019-03-10 NOTE — Progress Notes (Signed)
[place] [facility] [place] NAME: Colton Lamb    MR#:  [id number]  DATE OF BIRTH:  1951-07-14  SUBJECTIVE:   No acute events overnight, remains on low dose vasopressors, denies any chest pain, worsening shortness of breath.  REVIEW OF SYSTEMS:    Review of Systems  Constitutional: Negative for chills and fever.  HENT: Negative for congestion and tinnitus.   Eyes: Negative for blurred vision and double vision.  Respiratory: Negative for cough, shortness of breath and wheezing.   Cardiovascular: Negative for chest pain, orthopnea and PND.  Gastrointestinal: Negative for abdominal pain, diarrhea, nausea and vomiting.  Genitourinary: Negative for dysuria and hematuria.  Neurological: Positive for weakness (generalized). Negative for dizziness, sensory change and focal weakness.  All other systems reviewed and are negative.   Nutrition: Heart Healthy/Carb control Tolerating Diet: Yes Tolerating PT: Await Eval.      DRUG ALLERGIES:   Allergies  Allergen Reactions  . Iodine Other (See Comments)    Shortness of breath, swelling and hives  . Shrimp [Shellfish Allergy] Other (See Comments)    SWELLING    HIVES    SHORTNESS OF BREATH  . Tetracycline Rash    VITALS:  Blood pressure 105/78, pulse (!) 101, temperature 98.4 F (36.9 C), temperature source Axillary, resp. rate (!) 22, height 6' (1.829 m), weight 129.9 kg, SpO2 99 %.  PHYSICAL EXAMINATION:   Physical Exam  GENERAL:  68 y.o.-year-old obese patient lying in bed in no acute distress.  EYES: Pupils equal, round, reactive to light and accommodation. No scleral icterus. Extraocular muscles intact.  HEENT: Head atraumatic, normocephalic. Oropharynx and nasopharynx clear.  NECK:  Supple, no jugular venous distention. No thyroid enlargement, no tenderness.  LUNGS: Good a/e B/l, no wheezing, rales, rhonchi. No use of accessory muscles of respiration. Right sided CT in place with  some pus drainage noted.   CARDIOVASCULAR: S1, S2 normal. No murmurs, rubs, or gallops.  ABDOMEN: Soft, nontender, nondistended. Bowel sounds present. No organomegaly or mass.  EXTREMITIES: No cyanosis, clubbing or edema b/l.    NEUROLOGIC: Cranial nerves II through XII are intact. No focal Motor or sensory deficits b/l. Globally weak.    PSYCHIATRIC: The patient is alert and oriented x 3.  SKIN: No obvious rash, lesion, or ulcer.   Right chest wall CT in place. Foley cath in place with yellow urine draining.    LABORATORY PANEL:   CBC Recent Labs  Lab 03/10/19 0511  WBC 33.2*  HGB 10.7*  HCT 33.2*  PLT 316   ------------------------------------------------------------------------------------------------------------------  Chemistries  Recent Labs  Lab 03/10/19 0511  NA 136  K 3.4*  CL 99  CO2 24  GLUCOSE 307*  BUN 102*  CREATININE 3.99*  CALCIUM 7.6*  MG 2.6*  AST 39  ALT 27  ALKPHOS 118  BILITOT 0.9   ------------------------------------------------------------------------------------------------------------------  Cardiac Enzymes No results for input(s): TROPONINI in the last 168 hours. ------------------------------------------------------------------------------------------------------------------  RADIOLOGY:  Ct Head Wo Contrast  Result Date: 03/09/2019 CLINICAL DATA:  Shortness of breath. Encephalopathy. Under treatment for pneumonia. EXAM: CT HEAD WITHOUT CONTRAST TECHNIQUE: Contiguous axial images were obtained from the base of the skull through the vertex without intravenous contrast. COMPARISON:  03/04/2019 FINDINGS: Brain: There is no mass, hemorrhage or extra-axial collection. There is generalized atrophy without lobar predilection. Hypodensity of the white matter is most commonly associated with chronic microvascular disease. Vascular: No abnormal hyperdensity of the major intracranial arteries or dural venous sinuses.  No intracranial atherosclerosis.  Skull: The visualized skull base, calvarium and extracranial soft tissues are normal. Sinuses/Orbits: No fluid levels or advanced mucosal thickening of the visualized paranasal sinuses. No mastoid or middle ear effusion. The orbits are normal. IMPRESSION: Parenchymal volume loss greater than expected for age. No acute abnormality. Electronically Signed   By: Ulyses Jarred M.D.   On: 03/09/2019 00:57   Dg Chest Port 1 View  Result Date: 03/09/2019 CLINICAL DATA:  Postoperative EXAM: PORTABLE CHEST 1 VIEW COMPARISON:  Chest radiograph from one day prior. FINDINGS: Right pigtail chest tube, right internal jugular central venous catheter and 2 lead left subclavian ICD are stable in configuration. Stable cardiomediastinal silhouette with mild cardiomegaly. No pneumothorax. Small bilateral pleural effusions are stable. Cephalization of the pulmonary vasculature without overt pulmonary edema. Stable bibasilar atelectasis. IMPRESSION: No pneumothorax. Stable chest radiograph with cardiomegaly, no overt pulmonary edema and small bilateral pleural effusions with bibasilar atelectasis. Electronically Signed   By: Ilona Sorrel M.D.   On: 03/09/2019 10:56   Dg Chest Port 1 View  Result Date: 03/08/2019 CLINICAL DATA:  Respiratory distress EXAM: PORTABLE CHEST 1 VIEW COMPARISON:  03/05/2019 FINDINGS: Cardiac shadow remains enlarged. Defibrillator is again seen. Right jugular temporary dialysis catheter is noted. New pigtail catheter is noted in the right hemithorax with slight decrease in the degree of right-sided pleural effusion. No pneumothorax is noted. Mild vascular congestion is noted. IMPRESSION: Slight decrease in right-sided pleural effusion. Mild vascular congestion is noted. Electronically Signed   By: Inez Catalina M.D.   On: 03/08/2019 23:57     ASSESSMENT AND PLAN:   68 year old male with past medical history of paroxysmal atrial fibrillation, obstructive sleep apnea, diabetes, obesity, hypertension,  hyperlipidemia, GERD, history of coronary artery disease, history of chronic systolic CHF who presented to the hospital due to worsening shortness of breath.  1.  Sepsis/septic shock- secondary to pneumonia with empyema. - Continue IV fluids, vasopressor support, midodrine, IV Solu-Cortef -Afebrile, continue IV Unasyn.  2.  Acute respiratory failure with hypoxia-secondary to pneumonia with empyema. -Seen by general surgery, status post chest tube placement on the right.  Body fluid cultures are consistent with strep viridans. -Continue Unasyn.  Continue chest tube care as per general surgery. - Was on high flow nasal cannula now weaned to just nasal cannula.  Continue further care as per pulmonary/CT surgery.   3. Pneumonia/Empyema-source of patient's sepsis and hypoxemia.  Status post chest tube placement by CT surgery.  Patient is also TPA x2. -Continues to have significant drainage from the chest tube.  Questionable need for decortication but patient would be a high risk surgical candidate.  Further care as per CT surgery, plan for possible repeat imaging next week.  4.  Acute renal failure-patient's baseline creatinines around 1.1 and now elevated to over 3.5. -Seen by nephrology and no acute indication for dialysis. - Continue supportive care with IV fluids, vasopressors -This is likely ATN from underlying sepsis and hypotension.  Follow BUN/creatinine urine output.  Renal dose meds, avoid nephrotoxins.  5.  Diabetes type 2 without complication- continue sliding scale insulin - BS stable.   6.  Leukocytosis- secondary to underlying sepsis/pneumonia/empyema complicated with use of steroids. -Follow white cell count which is trending down.   7.  Hyperlipidemia-continue atorvastatin.  8.  COPD-no acute exacerbation.  Continue duo nebs, albuterol nebs.  All the records are reviewed and case discussed with Care Management/Social Worker. Management plans discussed with the patient,  family and they are in agreement.  CODE STATUS: Full code  DVT Prophylaxis: Heparin subcu  TOTAL TIME TAKING CARE OF THIS PATIENT: 30 minutes.   POSSIBLE D/C unclear DAYS, DEPENDING ON CLINICAL CONDITION and progress   Henreitta Leber M.D on 03/10/2019 at 3:01 PM  Between 7am to 6pm - Pager - [phone]  After 6pm go to [email or web address] - Proofreader  [facility]  Office  [phone]  CC: Primary care physician; Toni Arthurs, NP

## 2019-03-10 NOTE — Progress Notes (Signed)
[facility]  ROUNDING NOTE   Subjective:   Currently on [place] 08/08 0701 - 08/09 0700 In: 1616.6 [P.O.:600; I.V.:399.5; IV Piggyback:257.1] Out: 6546 [Urine:1155; Chest Tube:600]   UOP is improving Able to eat without nausea or vomiting Denies any acute shortness of breath BUN, creatinine are higher     Objective:  Vital signs in last 24 hours:  Temp:  [97.7 F (36.5 C)-98.4 F (36.9 C)] 98.4 F (36.9 C) (08/09 0800) Pulse Rate:  [88-116] 103 (08/09 0800) Resp:  [13-28] 25 (08/09 0800) BP: (82-143)/(47-118) 110/71 (08/09 0800) SpO2:  [94 %-97 %] 96 % (08/09 0800)  Weight change:  Filed Weights   03/05/19 1730 03/06/19 1545 03/06/19 1549  Weight: 132.1 kg 129.9 kg 129.9 kg    Intake/Output: I/O last 3 completed shifts: In: 1689.3 [P.O.:600; I.V.:429.2; Other:360; IV Piggyback:300.1] Out: 2655 [Urine:1305; Chest Tube:1350]   Intake/Output this shift:  Total I/O In: -  Out: 300 [Urine:300]  Physical Exam: General: Critically ill  Head: Normocephalic, atraumatic. Moist oral mucosal membranes  Eyes: Anicteric,    Neck: Supple   Lungs:   right sided rhonchi, diminished bilaterally, 2L [place]  Heart: irregular  Abdomen:  Soft, nontender, obese  Extremities:  trace peripheral edema.  Neurologic: Nonfocal, moving all four extremities  Skin: No lesions  Access: Right IJ temp HD catheter 8/4  Right chest tube in place  Basic Metabolic Panel: Recent Labs  Lab 03/07/19 0607 03/07/19 2104 03/08/19 0444 03/08/19 2349 03/09/19 0436 03/10/19 0511  NA 138 137 134* 137 139 136  K 4.4 4.1 4.1 3.9 4.1 3.4*  CL 101 98 97* 98 99 99  CO2 20* 24 23 24 25 24   GLUCOSE 260* 244* 346* 265* 284* 307*  BUN 100* 73* 90* 72* 77* 102*  CREATININE 4.94* 3.62* 4.23* 3.55* 3.98* 3.99*  CALCIUM 7.8* 7.8* 7.6* 7.6* 7.6* 7.6*  MG  --  2.2 2.3 2.2  --  2.6*  PHOS 6.9* 4.3 5.5*  --  6.2* 7.6*    Liver Function Tests: Recent Labs  Lab 03/06/19 0437 03/07/19 0607  03/08/19 0444 03/09/19 0436 03/10/19 0511  AST  --   --  44*  --  39  ALT  --   --  25  --  27  ALKPHOS  --   --  133*  --  118  BILITOT  --   --  1.0  --  0.9  PROT  --   --  5.5*  --  5.4*  ALBUMIN 2.3* 1.9* 1.7* 1.6* 1.6*   No results for input(s): LIPASE, AMYLASE in the last 168 hours. No results for input(s): AMMONIA in the last 168 hours.  CBC: Recent Labs  Lab 03/04/19 1414  03/06/19 0437 03/07/19 0607 03/08/19 0444 03/09/19 0436 03/10/19 0511  WBC 31.2*   < > 40.0* 36.9* 40.1* 38.2* 33.2*  NEUTROABS 27.8*  --   --   --   --   --   --   HGB 12.1*   < > 12.1* 11.7* 11.2* 10.9* 10.7*  HCT 37.0*   < > 37.0* 35.4* 34.0* 33.3* 33.2*  MCV 85.1   < > 83.7 83.1 83.1 82.8 84.5  PLT 331   < > 443* 381 361 327 316   < > = values in this interval not displayed.    Cardiac Enzymes: No results for input(s): CKTOTAL, CKMB, CKMBINDEX, TROPONINI in the last 168 hours.  BNP: Invalid input(s): POCBNP  CBG: Recent Labs  Lab  03/09/19 1135 03/09/19 1725 03/09/19 2216 03/10/19 0731 03/10/19 [place]*    Microbiology: Results for orders placed or performed during the hospital encounter of 03/04/19  Urine culture     Status: Abnormal   Collection Time: 03/04/19  2:14 PM   Specimen: Urine, Clean Catch  Result Value Ref Range Status   Specimen Description   Final    URINE, CLEAN CATCH Performed at [facility], [address], [place], [place] [address]    Special Requests   Final    NONE Performed at [facility], [address] [place], [place] [address]    Culture (A)  Final    <10,000 COLONIES/mL INSIGNIFICANT GROWTH Performed at [facility], [place] [address], [place], [place] [address]    Report Status 03/06/2019 FINAL  Final  SARS Coronavirus 2 [facility] order, Performed in [facility] hospital lab) Nasopharyngeal Nasopharyngeal Swab     Status: None   Collection Time: 03/04/19  2:17 PM   Specimen:  Nasopharyngeal Swab  Result Value Ref Range Status   SARS Coronavirus 2 NEGATIVE NEGATIVE Final    Comment: (NOTE) If result is NEGATIVE SARS-CoV-2 target nucleic acids are NOT DETECTED. The SARS-CoV-2 RNA is generally detectable in upper and lower  respiratory specimens during the acute phase of infection. The lowest  concentration of SARS-CoV-2 viral copies this assay can detect is 250  copies / mL. A negative result does not preclude SARS-CoV-2 infection  and should not be used as the sole basis for treatment or other  patient management decisions.  A negative result may occur with  improper specimen collection / handling, submission of specimen other  than nasopharyngeal swab, presence of viral mutation(s) within the  areas targeted by this assay, and inadequate number of viral copies  (<250 copies / mL). A negative result must be combined with clinical  observations, patient history, and epidemiological information. If result is POSITIVE SARS-CoV-2 target nucleic acids are DETECTED. The SARS-CoV-2 RNA is generally detectable in upper and lower  respiratory specimens dur ing the acute phase of infection.  Positive  results are indicative of active infection with SARS-CoV-2.  Clinical  correlation with patient history and other diagnostic information is  necessary to determine patient infection status.  Positive results do  not rule out bacterial infection or co-infection with other viruses. If result is PRESUMPTIVE POSTIVE SARS-CoV-2 nucleic acids MAY BE PRESENT.   A presumptive positive result was obtained on the submitted specimen  and confirmed on repeat testing.  While 2019 novel coronavirus  (SARS-CoV-2) nucleic acids may be present in the submitted sample  additional confirmatory testing may be necessary for epidemiological  and / or clinical management purposes  to differentiate between  SARS-CoV-2 and other Sarbecovirus currently known to infect humans.  If clinically  indicated additional testing with an alternate test  methodology [phone]) is advised. The SARS-CoV-2 RNA is generally  detectable in upper and lower respiratory sp ecimens during the acute  phase of infection. The expected result is Negative. Fact Sheet for Patients:  StrictlyIdeas.no Fact Sheet for Healthcare Providers: BankingDealers.co.za This test is not yet approved or cleared by the [place] FDA and has been authorized for detection and/or diagnosis of SARS-CoV-2 by FDA under an Emergency Use Authorization (EUA).  This EUA will remain in effect (meaning this test can be used) for the duration of the COVID-19 declaration under Section 564(b)(1) of the Act, 21 U.S.C. section 360bbb-3(b)(1), unless  the authorization is terminated or revoked sooner. Performed at [facility], [address] [place], [place] [address]   Blood Culture (routine x 2)     Status: None   Collection Time: 03/04/19  2:55 PM   Specimen: BLOOD  Result Value Ref Range Status   Specimen Description BLOOD BLOOD RIGHT HAND  Final   Special Requests   Final    BOTTLES DRAWN AEROBIC AND ANAEROBIC Blood Culture adequate volume   Culture   Final    NO GROWTH 5 DAYS Performed at [facility], [address] [place], [place] [address]    Report Status 03/09/2019 FINAL  Final  MRSA PCR Screening     Status: None   Collection Time: 03/04/19  8:36 PM   Specimen: Nasopharyngeal  Result Value Ref Range Status   MRSA by PCR NEGATIVE NEGATIVE Final    Comment:        The GeneXpert MRSA Assay (FDA approved for NASAL specimens only), is one component of a comprehensive MRSA colonization surveillance program. It is not intended to diagnose MRSA infection nor to guide or monitor treatment for MRSA infections. Performed at [facility], [address] [place], [place] [address]   Blood Culture (routine x 2)     Status: None    Collection Time: 03/04/19 10:01 PM   Specimen: BLOOD  Result Value Ref Range Status   Specimen Description BLOOD RIGHT ASSIST CONTROL  Final   Special Requests   Final    BOTTLES DRAWN AEROBIC AND ANAEROBIC Blood Culture adequate volume   Culture   Final    NO GROWTH 5 DAYS Performed at [facility], [address] [place], [place] [address]    Report Status 03/09/2019 FINAL  Final  Body fluid culture     Status: None (Preliminary result)   Collection Time: 03/05/19 11:21 AM   Specimen: [facility] Cytology Pleural fluid  Result Value Ref Range Status   Specimen Description   Final    PLEURAL Performed at [facility], [address], [place], [place] [address]    Special Requests   Final    NONE Performed at [facility], [address], [name], [place] [address]    Gram Stain   Final    FEW WBC PRESENT, PREDOMINANTLY PMN ABUNDANT GRAM NEGATIVE RODS ABUNDANT GRAM POSITIVE COCCI FEW GRAM VARIABLE ROD    Culture   Final    MODERATE VIRIDANS STREPTOCOCCUS HOLDING FOR POSSIBLE ANAEROBE CULTURE REINCUBATED FOR BETTER GROWTH Performed at [facility], [place] [address] [place], [place] [address]    Report Status PENDING  Incomplete    Coagulation Studies: No results for input(s): LABPROT, INR in the last 72 hours.  Urinalysis: No results for input(s): COLORURINE, LABSPEC, PHURINE, GLUCOSEU, HGBUR, BILIRUBINUR, KETONESUR, PROTEINUR, UROBILINOGEN, NITRITE, LEUKOCYTESUR in the last 72 hours.  Invalid input(s): APPERANCEUR    Imaging: Ct Head Wo Contrast  Result Date: 03/09/2019 CLINICAL DATA:  Shortness of breath. Encephalopathy. Under treatment for pneumonia. EXAM: CT HEAD WITHOUT CONTRAST TECHNIQUE: Contiguous axial images were obtained from the base of the skull through the vertex without intravenous contrast. COMPARISON:  03/04/2019 FINDINGS: Brain: There is no mass, hemorrhage or extra-axial collection. There is generalized atrophy without  lobar predilection. Hypodensity of the white matter is most commonly associated with chronic microvascular disease. Vascular: No abnormal hyperdensity of the major intracranial arteries or dural venous sinuses. No intracranial atherosclerosis. Skull: The visualized skull base, calvarium and extracranial soft  tissues are normal. Sinuses/Orbits: No fluid levels or advanced mucosal thickening of the visualized paranasal sinuses. No mastoid or middle ear effusion. The orbits are normal. IMPRESSION: Parenchymal volume loss greater than expected for age. No acute abnormality. Electronically Signed   By: Ulyses Jarred M.D.   On: 03/09/2019 00:57   Dg Chest Port 1 View  Result Date: 03/09/2019 CLINICAL DATA:  Postoperative EXAM: PORTABLE CHEST 1 VIEW COMPARISON:  Chest radiograph from one day prior. FINDINGS: Right pigtail chest tube, right internal jugular central venous catheter and 2 lead left subclavian ICD are stable in configuration. Stable cardiomediastinal silhouette with mild cardiomegaly. No pneumothorax. Small bilateral pleural effusions are stable. Cephalization of the pulmonary vasculature without overt pulmonary edema. Stable bibasilar atelectasis. IMPRESSION: No pneumothorax. Stable chest radiograph with cardiomegaly, no overt pulmonary edema and small bilateral pleural effusions with bibasilar atelectasis. Electronically Signed   By: Ilona Sorrel M.D.   On: 03/09/2019 10:56   Dg Chest Port 1 View  Result Date: 03/08/2019 CLINICAL DATA:  Respiratory distress EXAM: PORTABLE CHEST 1 VIEW COMPARISON:  03/05/2019 FINDINGS: Cardiac shadow remains enlarged. Defibrillator is again seen. Right jugular temporary dialysis catheter is noted. New pigtail catheter is noted in the right hemithorax with slight decrease in the degree of right-sided pleural effusion. No pneumothorax is noted. Mild vascular congestion is noted. IMPRESSION: Slight decrease in right-sided pleural effusion. Mild vascular congestion is  noted. Electronically Signed   By: Inez Catalina M.D.   On: 03/08/2019 23:57     Medications:   . sodium chloride Stopped (03/06/19 1411)  . ampicillin-sulbactam (UNASYN) IV 3 g (03/09/19 2024)  . norepinephrine (LEVOPHED) Adult infusion 13 mcg/min (03/10/19 0603)   . aspirin EC  81 mg Oral Daily  . atorvastatin  80 mg Oral Daily  . budesonide (PULMICORT) nebulizer solution  0.25 mg Nebulization BID  . Chlorhexidine Gluconate Cloth  6 each Topical Q0600  . heparin  5,000 Units Subcutaneous Q8H  . hydrocortisone sod succinate (SOLU-CORTEF) inj  50 mg Intravenous Q6H  . insulin aspart  0-9 Units Subcutaneous TID AC & HS  . ipratropium-albuterol  3 mL Nebulization Q4H  . lactobacillus  1 g Oral TID WC  . midodrine  10 mg Oral TID WC  . niacin  1,000 mg Oral QHS  . pantoprazole (PROTONIX) IV  40 mg Intravenous Daily   sodium chloride, acetaminophen **OR** acetaminophen, albuterol, morphine injection, nitroGLYCERIN, ondansetron **OR** ondansetron (ZOFRAN) IV, polyethylene glycol  Assessment/ Plan:  [name] is a 68 y.o. white male with diabetes mellitus type II, hypertension, obstructive sleep apnea, congestive heart failure, atrial fibrillation, hyperlipidemia, Crohn's disease, coronary artery disease, GERD, pacemaker, morbid obesity,  , who was admitted to [facility] on acute exacerbation of COPD, acute exacerbation of congestive heart failure, right pleural effusion, acute respiratory failure requiring BIPAP, sepsis with pneumonia and acute renal failure requiring hemodialysis   1. Acute renal failure: baseline creatinine of 1.1, normal GFR on 01/02/2019.  wiith underlying diabetic nephropathy with glucosuria.  -Acute renal failure secondary to ATN from Sepsis - non-oliguric, BUN creatinine remain elevated -Urine output appears to be improving - Electrolyte and volume status are acceptable today.  No acute indication for dialysis but will continue to monitor daily  2. Acute  respiratory failure:   Secondary to pneumonia, congestive heart failure and COPD exacerbation And now has chest tube placed via CT guidance on 03/06/2019 Requiring O2 supplementation with [place]   3. Acute exacerbation of systolic congestive heart failure:  7/10 echocardiogram 35-40% Volume status is acceptable   4. Sepsis with hypotension: cultures pending. G pos and G Neg organsims, strep viridans Chest tube unasyn   LOS: 6 Colton Lamb 8/9/20201:14 PM

## 2019-03-11 ENCOUNTER — Inpatient Hospital Stay: Payer: Medicare Other

## 2019-03-11 DIAGNOSIS — R6520 Severe sepsis without septic shock: Secondary | ICD-10-CM

## 2019-03-11 LAB — GLUCOSE, CAPILLARY
Glucose-Capillary: 314 mg/dL — ABNORMAL HIGH (ref 70–99)
Glucose-Capillary: 371 mg/dL — ABNORMAL HIGH (ref 70–99)
Glucose-Capillary: 339 mg/dL — ABNORMAL HIGH (ref 70–99)
Glucose-Capillary: 332 mg/dL — ABNORMAL HIGH (ref 70–99)

## 2019-03-11 LAB — BASIC METABOLIC PANEL
Anion gap: 13 (ref 5–15)
BUN: 112 mg/dL — ABNORMAL HIGH (ref 8–23)
CO2: 24 mmol/L (ref 22–32)
Calcium: 7.8 mg/dL — ABNORMAL LOW (ref 8.9–10.3)
Chloride: 98 mmol/L (ref 98–111)
Creatinine, Ser: 3.64 mg/dL — ABNORMAL HIGH (ref 0.61–1.24)
GFR calc Af Amer: 19 mL/min — ABNORMAL LOW (ref >60)
GFR calc non Af Amer: 16 mL/min — ABNORMAL LOW (ref >60)
Glucose, Bld: 325 mg/dL — ABNORMAL HIGH (ref 70–99)
Potassium: 3.5 mmol/L (ref 3.5–5.1)
Sodium: 135 mmol/L (ref 135–145)

## 2019-03-11 LAB — BODY FLUID CULTURE

## 2019-03-11 LAB — CBC
HCT: 33.8 % — ABNORMAL LOW (ref 39.0–52.0)
Hemoglobin: 10.8 g/dL — ABNORMAL LOW (ref 13.0–17.0)
MCH: 27.5 pg (ref 26.0–34.0)
MCHC: 32 g/dL (ref 30.0–36.0)
MCV: 86 fL (ref 80.0–100.0)
Platelets: 307 10*3/uL (ref 150–400)
RBC: 3.93 MIL/uL — ABNORMAL LOW (ref 4.22–5.81)
RDW: 16.4 % — ABNORMAL HIGH (ref 11.5–15.5)
WBC: 36.2 10*3/uL — ABNORMAL HIGH (ref 4.0–10.5)
nRBC: 0 % (ref 0.0–0.2)

## 2019-03-11 LAB — PHOSPHORUS: Phosphorus: 7.5 mg/dL — ABNORMAL HIGH (ref 2.5–4.6)

## 2019-03-11 LAB — MAGNESIUM: Magnesium: 2.9 mg/dL — ABNORMAL HIGH (ref 1.7–2.4)

## 2019-03-11 MED ORDER — INSULIN ASPART 100 UNIT/ML ~~LOC~~ SOLN
3.0000 [IU] | Freq: Three times a day (TID) | SUBCUTANEOUS | Status: DC
  Administered 2019-03-11 – 2019-03-12 (×3): 3 [IU] via SUBCUTANEOUS
  Filled 2019-03-11 (×2): qty 1

## 2019-03-11 MED ORDER — INSULIN DETEMIR 100 UNIT/ML ~~LOC~~ SOLN
13.0000 [IU] | Freq: Every day | SUBCUTANEOUS | Status: DC
  Administered 2019-03-11 – 2019-03-18 (×8): 13 [IU] via SUBCUTANEOUS
  Filled 2019-03-11 (×10): qty 0.13

## 2019-03-11 NOTE — Progress Notes (Signed)
[facility]  ROUNDING NOTE   Subjective:  Creatinine slightly improved though still high at 3.64. EGFR 16. Urine output was 2.1 L over the preceding 24 hours.    Objective:  Vital signs in last 24 hours:  Temp:  [98.4 F (36.9 C)-98.8 F (37.1 C)] 98.6 F (37 C) (08/10 0400) Pulse Rate:  [82-101] 91 (08/10 0600) Resp:  [18-31] 31 (08/10 0600) BP: (89-109)/(56-78) 105/62 (08/10 0600) SpO2:  [96 %-100 %] 98 % (08/10 0600)  Weight change:  Filed Weights   03/05/19 1730 03/06/19 1545 03/06/19 1549  Weight: 132.1 kg 129.9 kg 129.9 kg    Intake/Output: I/O last 3 completed shifts: In: 1170.5 [P.O.:480; I.V.:490.5; IV Piggyback:200] Out: 2725 [Urine:2730; Chest Tube:550]   Intake/Output this shift:  Total I/O In: 240 [P.O.:240] Out: -   Physical Exam: General: Critically ill  Head: Normocephalic, atraumatic. Moist oral mucosal membranes  Eyes: Anicteric  Neck: Supple   Lungs:  Bilateral rhonchi, normal effort  Heart: irregular  Abdomen:  Soft, nontender, obese  Extremities: trace peripheral edema.  Neurologic: Awake, alert, following commands  Skin: No lesions  Access: Right IJ temp HD catheter 8/4  Right chest tube in place  Basic Metabolic Panel: Recent Labs  Lab 03/07/19 2104 03/08/19 0444 03/08/19 2349 03/09/19 0436 03/10/19 0511 03/11/19 0459  NA 137 134* 137 139 136 135  K 4.1 4.1 3.9 4.1 3.4* 3.5  CL 98 97* 98 99 99 98  CO2 24 23 24 25 24 24   GLUCOSE 244* 346* 265* 284* 307* 325*  BUN 73* 90* 72* 77* 102* 112*  CREATININE 3.62* 4.23* 3.55* 3.98* 3.99* 3.64*  CALCIUM 7.8* 7.6* 7.6* 7.6* 7.6* 7.8*  MG 2.2 2.3 2.2  --  2.6* 2.9*  PHOS 4.3 5.5*  --  6.2* 7.6* 7.5*    Liver Function Tests: Recent Labs  Lab 03/06/19 0437 03/07/19 0607 03/08/19 0444 03/09/19 0436 03/10/19 0511  AST  --   --  44*  --  39  ALT  --   --  25  --  27  ALKPHOS  --   --  133*  --  118  BILITOT  --   --  1.0  --  0.9  PROT  --   --  5.5*  --  5.4*   ALBUMIN 2.3* 1.9* 1.7* 1.6* 1.6*   No results for input(s): LIPASE, AMYLASE in the last 168 hours. No results for input(s): AMMONIA in the last 168 hours.  CBC: Recent Labs  Lab 03/04/19 1414  03/07/19 0607 03/08/19 0444 03/09/19 0436 03/10/19 0511 03/11/19 0459  WBC 31.2*   < > 36.9* 40.1* 38.2* 33.2* 36.2*  NEUTROABS 27.8*  --   --   --   --   --   --   HGB 12.1*   < > 11.7* 11.2* 10.9* 10.7* 10.8*  HCT 37.0*   < > 35.4* 34.0* 33.3* 33.2* 33.8*  MCV 85.1   < > 83.1 83.1 82.8 84.5 86.0  PLT 331   < > 381 361 327 316 307   < > = values in this interval not displayed.    Cardiac Enzymes: No results for input(s): CKTOTAL, CKMB, CKMBINDEX, TROPONINI in the last 168 hours.  BNP: Invalid input(s): POCBNP  CBG: Recent Labs  Lab 03/10/19 0731 03/10/19 1134 03/10/19 1725 03/10/19 2141 03/11/19 0731  GLUCAP 237* 332* 374* 383* 314*    Microbiology: Results for orders placed or performed during the hospital encounter of 03/04/19  Urine culture     Status: Abnormal   Collection Time: 03/04/19  2:14 PM   Specimen: Urine, Clean Catch  Result Value Ref Range Status   Specimen Description   Final    URINE, CLEAN CATCH Performed at [facility], [address] [place], [place] [address]    Special Requests   Final    NONE Performed at [facility], [address] [place], [place] [address]    Culture (A)  Final    <10,000 COLONIES/mL INSIGNIFICANT GROWTH Performed at [facility] [address], [place], [place] [address]    Report Status 03/06/2019 FINAL  Final  SARS Coronavirus 2 [facility] order, Performed in [facility] hospital lab) Nasopharyngeal Nasopharyngeal Swab     Status: None   Collection Time: 03/04/19  2:17 PM   Specimen: Nasopharyngeal Swab  Result Value Ref Range Status   SARS Coronavirus 2 NEGATIVE NEGATIVE Final    Comment: (NOTE) If result is NEGATIVE SARS-CoV-2 target nucleic acids are NOT DETECTED. The SARS-CoV-2  RNA is generally detectable in upper and lower  respiratory specimens during the acute phase of infection. The lowest  concentration of SARS-CoV-2 viral copies this assay can detect is 250  copies / mL. A negative result does not preclude SARS-CoV-2 infection  and should not be used as the sole basis for treatment or other  patient management decisions.  A negative result may occur with  improper specimen collection / handling, submission of specimen other  than nasopharyngeal swab, presence of viral mutation(s) within the  areas targeted by this assay, and inadequate number of viral copies  (<250 copies / mL). A negative result must be combined with clinical  observations, patient history, and epidemiological information. If result is POSITIVE SARS-CoV-2 target nucleic acids are DETECTED. The SARS-CoV-2 RNA is generally detectable in upper and lower  respiratory specimens dur ing the acute phase of infection.  Positive  results are indicative of active infection with SARS-CoV-2.  Clinical  correlation with patient history and other diagnostic information is  necessary to determine patient infection status.  Positive results do  not rule out bacterial infection or co-infection with other viruses. If result is PRESUMPTIVE POSTIVE SARS-CoV-2 nucleic acids MAY BE PRESENT.   A presumptive positive result was obtained on the submitted specimen  and confirmed on repeat testing.  While 2019 novel coronavirus  (SARS-CoV-2) nucleic acids may be present in the submitted sample  additional confirmatory testing may be necessary for epidemiological  and / or clinical management purposes  to differentiate between  SARS-CoV-2 and other Sarbecovirus currently known to infect humans.  If clinically indicated additional testing with an alternate test  methodology [phone]) is advised. The SARS-CoV-2 RNA is generally  detectable in upper and lower respiratory sp ecimens during the acute  phase of  infection. The expected result is Negative. Fact Sheet for Patients:  StrictlyIdeas.no Fact Sheet for Healthcare Providers: BankingDealers.co.za This test is not yet approved or cleared by the [place] FDA and has been authorized for detection and/or diagnosis of SARS-CoV-2 by FDA under an Emergency Use Authorization (EUA).  This EUA will remain in effect (meaning this test can be used) for the duration of the COVID-19 declaration under Section 564(b)(1) of the Act, 21 U.S.C. section 360bbb-3(b)(1), unless the authorization is terminated or revoked sooner. Performed at [facility], [address] [place],  [address]   Blood Culture (routine x 2)     Status: None  Collection Time: 03/04/19  2:55 PM   Specimen: BLOOD  Result Value Ref Range Status   Specimen Description BLOOD BLOOD RIGHT HAND  Final   Special Requests   Final    BOTTLES DRAWN AEROBIC AND ANAEROBIC Blood Culture adequate volume   Culture   Final    NO GROWTH 5 DAYS Performed at [facility], [address] [place], [place] [address]    Report Status 03/09/2019 FINAL  Final  MRSA PCR Screening     Status: None   Collection Time: 03/04/19  8:36 PM   Specimen: Nasopharyngeal  Result Value Ref Range Status   MRSA by PCR NEGATIVE NEGATIVE Final    Comment:        The GeneXpert MRSA Assay (FDA approved for NASAL specimens only), is one component of a comprehensive MRSA colonization surveillance program. It is not intended to diagnose MRSA infection nor to guide or monitor treatment for MRSA infections. Performed at [facility], [address] [place], [place] [address]   Blood Culture (routine x 2)     Status: None   Collection Time: 03/04/19 10:01 PM   Specimen: BLOOD  Result Value Ref Range Status   Specimen Description BLOOD RIGHT ASSIST CONTROL  Final   Special Requests   Final    BOTTLES DRAWN AEROBIC AND  ANAEROBIC Blood Culture adequate volume   Culture   Final    NO GROWTH 5 DAYS Performed at [facility], [address] [place], [place] [address]    Report Status 03/09/2019 FINAL  Final  Body fluid culture     Status: None   Collection Time: 03/05/19 11:21 AM   Specimen: [facility] Cytology Pleural fluid  Result Value Ref Range Status   Specimen Description   Final    PLEURAL Performed at [facility], [address] [place], [place] [address]    Special Requests   Final    NONE Performed at [facility], [address] [place], [place] [address]    Gram Stain   Final    FEW WBC PRESENT, PREDOMINANTLY PMN ABUNDANT GRAM NEGATIVE RODS ABUNDANT GRAM POSITIVE COCCI FEW GRAM VARIABLE ROD    Culture   Final    MODERATE VIRIDANS STREPTOCOCCUS MODERATE PREVOTELLA BUCCAE FEW PREVOTELLA INTERMEDIA BETA LACTAMASE POSITIVE Performed at [facility], [place] [address], [place], [place] [address]    Report Status 03/11/2019 FINAL  Final    Coagulation Studies: No results for input(s): LABPROT, INR in the last 72 hours.  Urinalysis: No results for input(s): COLORURINE, LABSPEC, PHURINE, GLUCOSEU, HGBUR, BILIRUBINUR, KETONESUR, PROTEINUR, UROBILINOGEN, NITRITE, LEUKOCYTESUR in the last 72 hours.  Invalid input(s): APPERANCEUR    Imaging: No results found.   Medications:   . sodium chloride Stopped (03/06/19 1411)  . ampicillin-sulbactam (UNASYN) IV Stopped (03/10/19 2302)  . norepinephrine (LEVOPHED) Adult infusion 7 mcg/min (03/11/19 0617)   . aspirin EC  81 mg Oral Daily  . atorvastatin  80 mg Oral Daily  . budesonide (PULMICORT) nebulizer solution  0.25 mg Nebulization BID  . Chlorhexidine Gluconate Cloth  6 each Topical Q0600  . heparin  5,000 Units Subcutaneous Q8H  . hydrocortisone sod succinate (SOLU-CORTEF) inj  50 mg Intravenous Q6H  . insulin aspart  0-9 Units Subcutaneous TID AC & HS  . ipratropium-albuterol  3 mL Nebulization Q4H   . lactobacillus  1 g Oral TID WC  . midodrine  10 mg Oral TID WC  . niacin  1,000 mg  Oral QHS  . pantoprazole (PROTONIX) IV  40 mg Intravenous Daily   sodium chloride, acetaminophen **OR** acetaminophen, albuterol, morphine injection, nitroGLYCERIN, ondansetron **OR** ondansetron (ZOFRAN) IV, polyethylene glycol  Assessment/ Plan:  [name] is a 68 y.o. white male with diabetes mellitus type II, hypertension, obstructive sleep apnea, congestive heart failure, atrial fibrillation, hyperlipidemia, Crohn's disease, coronary artery disease, GERD, pacemaker, morbid obesity,  , who was admitted to [facility] on acute exacerbation of COPD, acute exacerbation of congestive heart failure, right pleural effusion, acute respiratory failure requiring BIPAP, sepsis with pneumonia and acute renal failure requiring hemodialysis   1. Acute renal failure: baseline creatinine of 1.1, normal GFR on 01/02/2019.  wiith underlying diabetic nephropathy with glucosuria.  -Acute renal failure secondary to ATN from Sepsis -Creatinine slightly lower at 3.64 however BUN still high at 112.  However urine output was 2.1 L over the preceding 24 hours.  Therefore no acute indication for dialysis at the moment.  Continue to monitor renal parameters daily.  2. Acute respiratory failure:   Secondary to pneumonia, congestive heart failure and COPD exacerbation And now has chest tube placed via CT guidance on 03/06/2019 Breathing comfortably at the moment.  3. Acute exacerbation of systolic congestive heart failure:  7/10 echocardiogram 35-40% Volume status is acceptable   4. Sepsis with hypotension: cultures pending. G pos and G Neg organsims, strep viridans Patient still on low-dose norepinephrine.  Maintain map of 65 or greater.   LOS: 7 Aryah Doering 8/10/202011:21 AM

## 2019-03-11 NOTE — TOC Initial Note (Signed)
Transition of Care [facility]) - Initial/Assessment Note    Patient Details  Name: Colton Lamb MRN: [id number] Date of Birth: 1951/05/10  Transition of Care [facility]) CM/SW Contact:    Candie Chroman, LCSW Phone Number: 03/11/2019, 12:15 PM  Clinical Narrative: Readmission prevention screen complete. CSW met with patient. Family member at bedside. CSW introduced role and explained that discharge planning would be discussed. Patient did not have any home health prior to admission. He uses a cane at home. He was not on oxygen at home. Patient reported he still drives. He has no issues with affording medications. No further concerns. CSW encouraged patient to contact CSW as needed. CSW will continue to follow patient for support and facilitate return home once stable.                Expected Discharge Plan: Home/Self Care Barriers to Discharge: Continued Medical Work up   Patient Goals and CMS Choice     Choice offered to / list presented to : NA  Expected Discharge Plan and Services Expected Discharge Plan: Home/Self Care       Living arrangements for the past 2 months: Single Family Home                                      Prior Living Arrangements/Services Living arrangements for the past 2 months: Single Family Home   Patient language and need for interpreter reviewed:: Yes Do you feel safe going back to the place where you live?: Yes      Need for Family Participation in Patient Care: Yes (Comment) Care giver support system in place?: Yes (comment) Current home services: DME Criminal Activity/Legal Involvement Pertinent to Current Situation/Hospitalization: No - Comment as needed  Activities of Daily Living Home Assistive Devices/Equipment: CPAP, Cane (specify quad or straight) ADL Screening (condition at time of admission) Patient's cognitive ability adequate to safely complete daily activities?: Yes Is the patient deaf or have difficulty hearing?: No Does the  patient have difficulty seeing, even when wearing glasses/contacts?: No Does the patient have difficulty concentrating, remembering, or making decisions?: No Patient able to express need for assistance with ADLs?: Yes Does the patient have difficulty dressing or bathing?: No Independently performs ADLs?: Yes (appropriate for developmental age) Does the patient have difficulty walking or climbing stairs?: No Weakness of Legs: Both Weakness of Arms/Hands: None  Permission Sought/Granted                  Emotional Assessment Appearance:: Appears stated age Attitude/Demeanor/Rapport: Engaged, Gracious Affect (typically observed): Accepting, Appropriate, Calm, Pleasant Orientation: : Oriented to Self, Oriented to Place, Oriented to  Time, Oriented to Situation Alcohol / Substance Use: Never Used Psych Involvement: No (comment)  Admission diagnosis:  SOB (shortness of breath) [R06.02] Pleural effusion on right [E03] Chronic diastolic congestive heart failure (HCC) [I50.32] Sepsis with acute hypoxic respiratory failure, due to unspecified organism, unspecified whether septic shock present ([place]) [A41.9, R65.20, J96.01] Patient Active Problem List   Diagnosis Date Noted  . Acute renal failure (ARF) ([place])   . Empyema lung ([place])   . Shortness of breath   . Pleural effusion on right   . Sepsis ([place]) 03/04/2019  . Restless legs syndrome (RLS) 06/16/2017  . Hypertension, essential 06/16/2017  . Coronary artery disease of native artery of native heart with stable angina pectoris ([place]) 04/27/2017  . Hydrocephalus ([place]) 09/08/2016  . Dizziness  and giddiness   . Elevated troponin 09/06/2016  . Type II diabetes mellitus (HCC)   . Ischemic cardiomyopathy   . Hyponatremia 07/01/2015  . Diabetes mellitus type 2, uncontrolled, with complications (HCC) 07/08/2014  . 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 OSA (obstructive sleep apnea) 12/20/2013  . Morbid obesity  (HCC) 12/20/2013  . AKI (acute kidney injury) - Creatinine improved at d/c 12/14/2013  . Elevated TSH - will need f/u TFTs with PCP in 3-4 weeks 12/14/2013  . Junctional bradycardia - resolved 12/14/2013  . Syncope - due to bradycardia in setting of Digoxin Toxicity 12/11/2013  . Chronic systolic heart failure (HCC) 12/06/2013  . At risk for sudden cardiac death - on LifeVest 11/25/2013  . Acute combined systolic and diastolic CHF, NYHA class 3 -- 2/2 MI 11/25/2013  . Cardiomyopathy, ischemic 11/25/2013  . Atrial flutter (HCC) 11/22/2013  . NSTEMI (non-ST elevated myocardial infarction) (HCC) 11/22/2013  . Long term current use of anticoagulant 10/19/2010  . Hyperlipidemia 05/06/2010  . Edema 05/06/2010  . CAD S/P percutaneous coronary angioplasty - multiple PCIs 11/11/2008  . Atrial fibrillation (HCC) 11/11/2008  . GERD 11/11/2008  . CROHN'S DISEASE-LARGE & SMALL INTESTINE 11/11/2008  . COLONIC POLYPS, HX OF 11/11/2008  . SHELLFISH ALLERGY 11/11/2008   PCP:  Leach, Shannon, NP Pharmacy:   [id number] - [place], [place] - [address] [place] [address] Phone: [phone] Fax: [phone]  [facility] - [place], [place] - [address] AT Portal to Registered [facility] Sites [address] [place] [address] Phone: [phone] Fax: [phone]     Social Determinants of Health (SDOH) Interventions    Readmission Risk Interventions Readmission Risk Prevention Plan 03/11/2019  Transportation Screening Complete  Social Work Consult for Recovery Care Planning/Counseling Complete  Palliative Care Screening Not Applicable  Medication Review (RN Care Manager) Complete  Some recent data might be hidden    

## 2019-03-11 NOTE — Progress Notes (Addendum)
Inpatient Diabetes Program Recommendations  AACE/ADA: New Consensus Statement on Inpatient Glycemic Control   Target Ranges:  Prepandial:   less than 140 mg/dL      Peak postprandial:   less than 180 mg/dL (1-2 hours)      Critically ill patients:  140 - 180 mg/dL   Results for MCCALL, LOMAX (MRN [id number]) as of 03/11/2019 10:31  Ref. Range 03/10/2019 07:31 03/10/2019 11:34 03/10/2019 17:25 03/10/2019 21:41 03/11/2019 07:31  Glucose-Capillary Latest Ref Range: 70 - 99 mg/dL 237 (H)  Novolog 5 units 332 (H)  Novolog 7 units 374 (H)  Novolog 9 units 383 (H)  Novolog 9 units 314 (H)  Novolog 7 units   Review of Glycemic Control  Diabetes history: DM2 Outpatient Diabetes medications: Jardiance 10 mg daily, Metformin 1000 mg BID Current orders for Inpatient glycemic control: Novolog 0-9 units AC&HS; Solucortef 50 mg Q6H  Inpatient Diabetes Program Recommendations:   Insulin-Basal: Please consider ordering Levemir 13 units Q24H starting now (dose based on 129.9 kg x 0.1 units).  Insulin-Meal Coverage: If steroids are continued, please consider ordering Novolog 3 units TID with meals for meal coverage if patient eats at least 50% of meals.  Thanks, Barnie Alderman, RN, MSN, CDE Diabetes Coordinator Inpatient Diabetes Program [phone] (Team Pager from 8am to 5pm)

## 2019-03-11 NOTE — Progress Notes (Signed)
[facility] [place] NAME: Colton Lamb    MR#:  [id number]  DATE OF BIRTH:  1951-01-02  SUBJECTIVE:   She overall feels better, denies any worsening shortness of breath or pain.  Still has a chest tube which is draining significant yellow-tinged fluid.  CT chest done today showing improvement in the right-sided empyema.  REVIEW OF SYSTEMS:    Review of Systems  Constitutional: Negative for chills and fever.  HENT: Negative for congestion and tinnitus.   Eyes: Negative for blurred vision and double vision.  Respiratory: Negative for cough, shortness of breath and wheezing.   Cardiovascular: Negative for chest pain, orthopnea and PND.  Gastrointestinal: Negative for abdominal pain, diarrhea, nausea and vomiting.  Genitourinary: Negative for dysuria and hematuria.  Neurological: Positive for weakness (generalized). Negative for dizziness, sensory change and focal weakness.  All other systems reviewed and are negative.   Nutrition: Heart Healthy/Carb control Tolerating Diet: Yes Tolerating PT: Await Eval.   DRUG ALLERGIES:   Allergies  Allergen Reactions  . Iodine Other (See Comments)    Shortness of breath, swelling and hives  . Shrimp [Shellfish Allergy] Other (See Comments)    SWELLING    HIVES    SHORTNESS OF BREATH  . Tetracycline Rash    VITALS:  Blood pressure 93/61, pulse 93, temperature 98.9 F (37.2 C), temperature source Oral, resp. rate (!) 24, height 6' (1.829 m), weight 129.9 kg, SpO2 95 %.  PHYSICAL EXAMINATION:   Physical Exam  GENERAL:  68 y.o.-year-old obese patient lying in bed in no acute distress.  EYES: Pupils equal, round, reactive to light and accommodation. No scleral icterus. Extraocular muscles intact.  HEENT: Head atraumatic, normocephalic. Oropharynx and nasopharynx clear.  NECK:  Supple, no jugular venous distention. No thyroid enlargement, no tenderness.  LUNGS: Good a/e B/l, no wheezing,  rales, rhonchi. No use of accessory muscles of respiration. Right sided CT in place with some pus drainage noted.   CARDIOVASCULAR: S1, S2 normal. No murmurs, rubs, or gallops.  ABDOMEN: Soft, nontender, nondistended. Bowel sounds present. No organomegaly or mass.  EXTREMITIES: No cyanosis, clubbing or edema b/l.    NEUROLOGIC: Cranial nerves II through XII are intact. No focal Motor or sensory deficits b/l. Globally weak.    PSYCHIATRIC: The patient is alert and oriented x 3.  SKIN: No obvious rash, lesion, or ulcer.   Right chest wall CT in place with some yellow fluid draining. Foley cath in place with yellow urine draining.    LABORATORY PANEL:   CBC Recent Labs  Lab 03/11/19 0459  WBC 36.2*  HGB 10.8*  HCT 33.8*  PLT 307   ------------------------------------------------------------------------------------------------------------------  Chemistries  Recent Labs  Lab 03/10/19 0511 03/11/19 0459  NA 136 135  K 3.4* 3.5  CL 99 98  CO2 24 24  GLUCOSE 307* 325*  BUN 102* 112*  CREATININE 3.99* 3.64*  CALCIUM 7.6* 7.8*  MG 2.6* 2.9*  AST 39  --   ALT 27  --   ALKPHOS 118  --   BILITOT 0.9  --    ------------------------------------------------------------------------------------------------------------------  Cardiac Enzymes No results for input(s): TROPONINI in the last 168 hours. ------------------------------------------------------------------------------------------------------------------  RADIOLOGY:  Ct Chest Wo Contrast  Result Date: 03/11/2019 CLINICAL DATA:  Pleural effusion EXAM: CT CHEST WITHOUT CONTRAST TECHNIQUE: Multidetector CT imaging of the chest was performed following the standard protocol without IV contrast. COMPARISON:  CT chest dated March 06, 2019 FINDINGS: Cardiovascular: The heart  size remains enlarged. There is a small pericardial effusion. The ascending aorta is ectatic measuring approximately 4.1 cm. This is similar to prior study.  Coronary artery calcifications are noted. Thoracic aortic calcifications are noted. There is a the dual chamber left-sided pacemaker in place. There is a right-sided central venous catheter with tip terminating near the cavoatrial junction. Mediastinum/Nodes: Again noted are enlarged right hilar lymph nodes, presumably reactive. There is no significant axillary or supraclavicular adenopathy. The thyroid gland is unremarkable. The esophagus is unremarkable. Lungs/Pleura: The previously noted right-sided empyema has significantly decreased in size status post placement of a right-sided pigtail drainage catheter. There remains a small residual right-sided fluid collection. There is persistent collapse of much of the right lower lobe with some improved expansion from the prior study. There is a trace right-sided pneumothorax likely related to the presence of the drainage catheter. There is a trace left-sided pleural effusion with adjacent atelectasis. Upper Abdomen: The liver contour somewhat nodular. There is cholelithiasis without CT evidence for acute cholecystitis involving the visualized portions of the gallbladder. Musculoskeletal: No chest wall mass or suspicious bone lesions identified. IMPRESSION: 1. Significant interval improvement in the previously demonstrated right-sided empyema. There is a small residual fluid collection in the right pleural space. 2. Persistent but improved collapse/consolidation involving the right lower lobe. 3. Trace left-sided pleural effusion with adjacent atelectasis. 4. Slightly nodular appearance of the liver suggestive of underlying cirrhosis. 5. Cholelithiasis without CT evidence for acute cholecystitis. 6. Cardiomegaly with a small pericardial effusion which has increased in size from prior study. 7. Right hilar adenopathy, presumably reactive. 8. Ectatic ascending aorta measuring 4.1 cm. Recommend annual imaging followup by CTA or MRA. This recommendation follows 2010  ACCF/AHA/AATS/ACR/ASA/SCA/SCAI/SIR/STS/SVM Guidelines for the Diagnosis and Management of Patients with Thoracic Aortic Disease. Circulation. 2010; 121: A453-M468. Aortic aneurysm NOS (ICD10-I71.9) Aortic Atherosclerosis (ICD10-I70.0). Electronically Signed   By: Constance Holster M.D.   On: 03/11/2019 15:41     ASSESSMENT AND PLAN:   68 year old male with past medical history of paroxysmal atrial fibrillation, obstructive sleep apnea, diabetes, obesity, hypertension, hyperlipidemia, GERD, history of coronary artery disease, history of chronic systolic CHF who presented to the hospital due to worsening shortness of breath.  1.  Sepsis/septic shock- secondary to pneumonia with empyema. - Continue IV fluids, vasopressor support, midodrine, IV Solu-Cortef -Continue Unasyn, body fluid cultures growing strep viridans, Prevotella species.  2.  Acute respiratory failure with hypoxia-secondary to pneumonia with empyema. -Seen by general surgery, status post chest tube placement on the right.  Body fluid cultures are consistent with strep viridans, Prevotella. -Continue IV Unasyn for now.  CT chest today showing improvement in the right-sided empyema. -Await further input from CT surgery/pulmonary.  3. Pneumonia/Empyema-source of patient's sepsis and hypoxemia.  Status post chest tube placement by CT surgery.  Patient is also TPA x2. -CT chest x-ray showing significant improvement in his right-sided empyema.  Await further input from CT surgery.  Patient continues to have significant drainage from the chest tube.  4.  Acute renal failure-patient's baseline creatinines around 1.1 and now elevated to over 3.5. -Seen by nephrology and no acute indication for dialysis. - Continue supportive care with IV fluids, vasopressors -This is likely ATN from underlying sepsis and hypotension.  Follow BUN/creatinine urine output.  5.  Diabetes type 2 without complication- continue sliding scale insulin - BS  stable.   6.  Leukocytosis- secondary to underlying sepsis/pneumonia/empyema complicated with use of steroids. -Follow white cell count which is trending down.  7.  Hyperlipidemia-continue atorvastatin.  8.  COPD-no acute exacerbation.  Continue duo nebs, albuterol nebs.  All the records are reviewed and case discussed with Care Management/Social Worker. Management plans discussed with the patient, family and they are in agreement.  CODE STATUS: Full code  DVT Prophylaxis: Heparin subcu  TOTAL TIME TAKING CARE OF THIS PATIENT: 30 minutes.   POSSIBLE D/C unclear DAYS, DEPENDING ON CLINICAL CONDITION and progress   Henreitta Leber M.D on 03/11/2019 at 3:53 PM  Between 7am to 6pm - Pager - [phone]  After 6pm go to [email or web address] - Proofreader  [facility]  Office  [phone]  CC: Primary care physician; Toni Arthurs, NP

## 2019-03-11 NOTE — Progress Notes (Signed)
Name: LAJUAN KOVALESKI MRN: [id number] DOB: 1950/09/24     CONSULTATION DATE: 03/04/2019  CHIEF COMPLAINT:  Shortness of breath   HISTORY OF PRESENT ILLNESS:   68 y.o male with PMH of  Systolic HF with EF 48% (0/16/55), HTN, PAF (on xarelto), sleep apnea, ischemic cardiomyopathy, CAD, obesity, HLD, GERD presented to ED via EMS on CPAP with AMS and several days of worsening SOB. In ED he was placed on BiPAP and code sepsis activated due to Leukocytosis, lactic acidosis, and hypotension. IV abx initiated.  CXR showed pulmonary edema, ED gave IVF and lasix judiciously as patient was found to have AKI.      OVERNIGHT EVENTS: Chest tubes still draining. Currently on 4L [place]. Last hemodialysis was 8/7, urine output improving. He is eating well and reports bowel movement yesterday. Remains on levophed and started on midodrine for hypotension.    SIGNIFICANT EVENTS: 8/3 >CT head: Chronic ventriculomegaly without evidence of focal brain insult. No visible change since February 2018. This is felt most likely secondary to central atrophy. Normal pressure hydrocephalus is not excluded but not favored. 8/3 >Admitted to ICU requiring Bipap  8/3 > Levophed initiated for hypotension 8/4> Thoracentesis: 200 mL hazy yellow fluid sent to lab 8/4> Trialysis RIJ placed 8/4 > 1st HD treatment- no UF 8/5> 2nd HD treatment - no UF 8/5> CT placed to drain right pleural empyema 8/6> TPA instilled in CT 8/7> 3rd HD treatment - no UF 8/7> 2nd round of TPA instilled in CT 8/8> CT Head- parenchymal volume loss greater than expected for age. No acute abnormalities 8/8> CXR 8/10> Chest CT ordered     CULTURES: 8/3 > Blood & Urine cultures- pending, no growth 8/7 8/3 > COVID 19 - negative 8/3 > MRSA PCR- negative 8/3 > Procalcitonin - 27.61 ~ 31.81 ~ 27.22 8/4> Pleural fluid- LD: 5,463 & WBC: 17,693/ gram stain positive for abundant GNR & GPC 8/7> Pleural fluid cytology- moderate viridans streptococcus,  holding for possible anaerobe  ANTIBIOTICS: 8/3 > Cefepime, d/c 8/3 > Metronidazole x 1 dose, d/c'd 8/3 8/3 > Vancomycin x 2 doses, d/c'd 8/3 8/8 > Unasyn started x 2 doses  PAST MEDICAL HISTORY :   has a past medical history of Atrial flutter ([place]), Chronic systolic heart failure ([place]), Coronary artery disease, Crohn's ileocolitis ([place]), GERD (gastroesophageal reflux disease), adenomatous colonic polyps (11/2003), Hyperlipidemia, Hypertension, Ischemic cardiomyopathy, Obesity, Paroxysmal atrial fibrillation ([place]), Sleep apnea, Syncope, and Type II diabetes mellitus ([place]).  has a past surgical history that includes Ileocecal resection and sigmoid enterocolonic fistula repair (09/1998); Hydrocele surgery (Bilateral); Foot surgery (Left); Cardioversion (N/A, 03/05/2014); Cardiac defibrillator placement (04/16/2014); Cardiac electrophysiology study and ablation (04/16/2014); Coronary angioplasty with stent (2007; 2008 X 2); Cardiac catheterization (10/2013); left heart catheterization with coronary angiogram (N/A, 11/22/2013); Atrial flutter ablation (N/A, 04/16/2014); implantable cardioverter defibrillator implant (N/A, 04/16/2014); Cataract extraction w/PHACO (Right, 01/04/2017); and Cataract extraction w/PHACO (Left, 02/08/2017).   REVIEW OF SYSTEMS:    Gen:  Denies  fever, sweats, chills  HEENT: Denies blurred vision, double vision, ear pain, eye pain, hearing loss, nose bleeds, sore throat Cardiac:  No dizziness, chest pain or heaviness, chest tightness Resp:   cough, -sputum production, -shortness of breath,-wheezing, -hemoptysis  Gi: Denies swallowing difficulty, stomach pain, nausea or vomiting, diarrhea, constipation, bowel incontinence Gu:  Denies bladder incontinence, burning urine Ext:   Denies Joint pain, stiffness or swelling Skin: Denies  skin rash, easy bruising or bleeding  Psych:   Denies depression, insomnia or hallucinations  Other:  All other systems negative   VITAL SIGNS: Temp:   [98.4 F (36.9 C)-98.8 F (37.1 C)] 98.6 F (37 C) (08/10 0400) Pulse Rate:  [82-101] 91 (08/10 0600) Resp:  [18-31] 31 (08/10 0600) BP: (89-109)/(56-78) 105/62 (08/10 0600) SpO2:  [96 %-100 %] 98 % (08/10 0600)   I/O last 3 completed shifts: In: 1170.5 [P.O.:480; I.V.:490.5; IV Piggyback:200] Out: 1751 [Urine:2730; Chest Tube:550] Total I/O In: 240 [P.O.:240] Out: -    SpO2: 98 % O2 Flow Rate (L/min): 4 L/min FiO2 (%): 40 %   Physical Examination:  Physical Exam  Constitutional: He is oriented to person, place, and time. He appears well-developed and well-nourished. No distress.  HENT:  Head: Normocephalic and atraumatic.  Mouth/Throat: Oropharynx is clear and moist.  Eyes: Pupils are equal, round, and reactive to light. No scleral icterus.  Neck: Neck supple.  Cardiovascular: Normal rate. An irregularly irregular rhythm present. Exam reveals distant heart sounds.  Pulses:      Dorsalis pedis pulses are 2+ on the right side and 2+ on the left side.  Respiratory: Effort normal. No respiratory distress. He has no wheezes. He has no rhonchi.  4L [place] Chest tube placed on right side  GI: Soft. Bowel sounds are normal. He exhibits distension (mild). There is no abdominal tenderness. There is no guarding.  Musculoskeletal:        General: Edema (2+ lower leg bilaterally) present.  Neurological: He is alert and oriented to person, place, and time.  Able to follow simple commands   Skin: Skin is warm and dry.    I personally reviewed lab work that was obtained in last 24 hrs. CBC    Component Value Date/Time   WBC 36.2 (H) 03/11/2019 0459   RBC 3.93 (L) 03/11/2019 0459   HGB 10.8 (L) 03/11/2019 0459   HGB 13.9 11/22/2013 0301   HCT 33.8 (L) 03/11/2019 0459   HCT 43.3 11/22/2013 0301   PLT 307 03/11/2019 0459   PLT 350 11/22/2013 0301   MCV 86.0 03/11/2019 0459   MCV 81 11/22/2013 0301   MCH 27.5 03/11/2019 0459   MCHC 32.0 03/11/2019 0459   RDW 16.4 (H) 03/11/2019  0459   RDW 16.1 (H) 11/22/2013 0301   LYMPHSABS 0.7 03/04/2019 1414   MONOABS 1.5 (H) 03/04/2019 1414   EOSABS 0.1 03/04/2019 1414   BASOSABS 0.1 03/04/2019 1414   BMP Latest Ref Rng & Units 03/11/2019 03/10/2019 03/09/2019  Glucose 70 - 99 mg/dL 325(H) 307(H) 284(H)  BUN 8 - 23 mg/dL 112(H) 102(H) 77(H)  Creatinine 0.61 - 1.24 mg/dL 3.64(H) 3.99(H) 3.98(H)  BUN/Creat Ratio 10 - 24 - - -  Sodium 135 - 145 mmol/L 135 136 139  Potassium 3.5 - 5.1 mmol/L 3.5 3.4(L) 4.1  Chloride 98 - 111 mmol/L 98 99 99  CO2 22 - 32 mmol/L 24 24 25   Calcium 8.9 - 10.3 mg/dL 7.8(L) 7.6(L) 7.6(L)   Magnesium: 2.9  Phosphorus: 7.5  MEDICATIONS: I have reviewed all medications and confirmed regimen as documented   CULTURE RESULTS   Recent Results (from the past 240 hour(s))  Urine culture     Status: Abnormal   Collection Time: 03/04/19  2:14 PM   Specimen: Urine, Clean Catch  Result Value Ref Range Status   Specimen Description   Final    URINE, CLEAN CATCH Performed at [facility], [address], [place], [place] [address]    Special Requests   Final    NONE Performed  at [facility], [address], [place], [place] [address]    Culture (A)  Final    <10,000 COLONIES/mL INSIGNIFICANT GROWTH Performed at [facility] [address] [place], [place] [address]    Report Status 03/06/2019 FINAL  Final  SARS Coronavirus 2 [facility] order, Performed in [facility] hospital lab) Nasopharyngeal Nasopharyngeal Swab     Status: None   Collection Time: 03/04/19  2:17 PM   Specimen: Nasopharyngeal Swab  Result Value Ref Range Status   SARS Coronavirus 2 NEGATIVE NEGATIVE Final    Comment: (NOTE) If result is NEGATIVE SARS-CoV-2 target nucleic acids are NOT DETECTED. The SARS-CoV-2 RNA is generally detectable in upper and lower  respiratory specimens during the acute phase of infection. The lowest  concentration of SARS-CoV-2 viral copies this assay can detect is 250   copies / mL. A negative result does not preclude SARS-CoV-2 infection  and should not be used as the sole basis for treatment or other  patient management decisions.  A negative result may occur with  improper specimen collection / handling, submission of specimen other  than nasopharyngeal swab, presence of viral mutation(s) within the  areas targeted by this assay, and inadequate number of viral copies  (<250 copies / mL). A negative result must be combined with clinical  observations, patient history, and epidemiological information. If result is POSITIVE SARS-CoV-2 target nucleic acids are DETECTED. The SARS-CoV-2 RNA is generally detectable in upper and lower  respiratory specimens dur ing the acute phase of infection.  Positive  results are indicative of active infection with SARS-CoV-2.  Clinical  correlation with patient history and other diagnostic information is  necessary to determine patient infection status.  Positive results do  not rule out bacterial infection or co-infection with other viruses. If result is PRESUMPTIVE POSTIVE SARS-CoV-2 nucleic acids MAY BE PRESENT.   A presumptive positive result was obtained on the submitted specimen  and confirmed on repeat testing.  While 2019 novel coronavirus  (SARS-CoV-2) nucleic acids may be present in the submitted sample  additional confirmatory testing may be necessary for epidemiological  and / or clinical management purposes  to differentiate between  SARS-CoV-2 and other Sarbecovirus currently known to infect humans.  If clinically indicated additional testing with an alternate test  methodology [phone]) is advised. The SARS-CoV-2 RNA is generally  detectable in upper and lower respiratory sp ecimens during the acute  phase of infection. The expected result is Negative. Fact Sheet for Patients:  StrictlyIdeas.no Fact Sheet for Healthcare Providers: BankingDealers.co.za  This test is not yet approved or cleared by the [place] FDA and has been authorized for detection and/or diagnosis of SARS-CoV-2 by FDA under an Emergency Use Authorization (EUA).  This EUA will remain in effect (meaning this test can be used) for the duration of the COVID-19 declaration under Section 564(b)(1) of the Act, 21 U.S.C. section 360bbb-3(b)(1), unless the authorization is terminated or revoked sooner. Performed at [facility], [address] [place], [place] [address]   Blood Culture (routine x 2)     Status: None   Collection Time: 03/04/19  2:55 PM   Specimen: BLOOD  Result Value Ref Range Status   Specimen Description BLOOD BLOOD RIGHT HAND  Final   Special Requests   Final    BOTTLES DRAWN AEROBIC AND ANAEROBIC Blood Culture adequate volume   Culture   Final    NO GROWTH 5 DAYS Performed at [facility], [address] [place], [place] [address]    Report Status 03/09/2019 FINAL  Final  MRSA PCR Screening     Status: None   Collection Time: 03/04/19  8:36 PM   Specimen: Nasopharyngeal  Result Value Ref Range Status   MRSA by PCR NEGATIVE NEGATIVE Final    Comment:        The GeneXpert MRSA Assay (FDA approved for NASAL specimens only), is one component of a comprehensive MRSA colonization surveillance program. It is not intended to diagnose MRSA infection nor to guide or monitor treatment for MRSA infections. Performed at [facility], [address] [place], [place] [address]   Blood Culture (routine x 2)     Status: None   Collection Time: 03/04/19 10:01 PM   Specimen: BLOOD  Result Value Ref Range Status   Specimen Description BLOOD RIGHT ASSIST CONTROL  Final   Special Requests   Final    BOTTLES DRAWN AEROBIC AND ANAEROBIC Blood Culture adequate volume   Culture   Final    NO GROWTH 5 DAYS Performed at [facility], [address] [employer], [place] [address]    Report Status 03/09/2019 FINAL   Final  Body fluid culture     Status: None   Collection Time: 03/05/19 11:21 AM   Specimen: [facility] Cytology Pleural fluid  Result Value Ref Range Status   Specimen Description   Final    PLEURAL Performed at [facility], [address], [place], [place] [address]    Special Requests   Final    NONE Performed at [facility], [address], [place], [place] [address]    Gram Stain   Final    FEW WBC PRESENT, PREDOMINANTLY PMN ABUNDANT GRAM NEGATIVE RODS ABUNDANT GRAM POSITIVE COCCI FEW GRAM VARIABLE ROD    Culture   Final    MODERATE VIRIDANS STREPTOCOCCUS MODERATE PREVOTELLA BUCCAE FEW PREVOTELLA INTERMEDIA BETA LACTAMASE POSITIVE Performed at [facility], [place] [address], [place], [place] [address]    Report Status 03/11/2019 FINAL  Final         Indwelling Urinary Catheter continued, requirement due to   Reason to continue Indwelling Urinary Catheter strict Intake/Output monitoring for hemodynamic instability     ASSESSMENT AND PLAN SYNOPSIS  68 y.o male with improving hypoxic respiratory failure, on 4L [place], and septic shock, still requiring Levophed, secondary to lung abscess/empyema and CAP Strep Virdans pneumonia in the presence of acute renal failure receiving hemodialysis.   Empyema  -Followed by CT surgery  -Chest tube placed  -mikly, yellow drainage   -TPA x 2 doses - Repeat Chest CT ordered  CAP Strep Virdans pneumonia -IV abx changed to Unasyn  -Wean Fio2 as tolerated   SHOCK-SEPSIS -improving  -Still requiring levophed, titrating -use vasopressors to keep MAP>55  -On midodrine  ACUTE KIDNEY INJURY/Renal Failure -Nephrology consulted  -HD per nephrology, last received 8/7 -follow chem 7 -follow UO  -UO improving  -continue Foley Catheter-assess need -Avoid nephrotoxic agents  CARDIAC ICU monitoring  GI GI PROPHYLAXIS as indicated Lactobacillus- diarrhea prophylaxis   NUTRITIONAL STATUS DIET-->Heart  healthy/carb modified    -Eating well Constipation protocol as indicated  ENDO - will use ICU hypoglycemic\Hyperglycemia protocol if needed   ELECTROLYTES -follow labs as needed -replace as needed -pharmacy consultation and following  Physical Therapy ordered   DVT/GI PRX ordered- Heparin injection  TRANSFUSIONS AS NEEDED MONITOR FSBS ASSESS the need for LABS   Patient is improving but remains ill with multiorgan  involvement.    Anibal Henderson PA-S

## 2019-03-11 NOTE — Progress Notes (Signed)
PT Cancellation Note  Patient Details Name: Colton Lamb MRN: [id number] DOB: [birth year]   Cancelled Treatment:    Reason Eval/Treat Not Completed: Other (comment). Consult received and chart reviewed. Pt is currently on BP drip for hypotension with pending CT chest due to chest tube drainage. Will hold this date until further POC established prior to performing OOB mobility. Will re-attempt next date if medically stable. Of note, pt has R IJ temp HD cath in place.   Colton Lamb 03/11/2019, 2:42 PM [name], PT, DPT [phone]

## 2019-03-12 ENCOUNTER — Ambulatory Visit: Payer: Medicare Other | Admitting: Family

## 2019-03-12 DIAGNOSIS — I5032 Chronic diastolic (congestive) heart failure: Secondary | ICD-10-CM

## 2019-03-12 LAB — CBC WITH DIFFERENTIAL/PLATELET
Abs Immature Granulocytes: 0.86 10*3/uL — ABNORMAL HIGH (ref 0.00–0.07)
Basophils Absolute: 0.1 10*3/uL (ref 0.0–0.1)
Basophils Relative: 0 %
Eosinophils Absolute: 0 10*3/uL (ref 0.0–0.5)
Eosinophils Relative: 0 %
HCT: 31.2 % — ABNORMAL LOW (ref 39.0–52.0)
Hemoglobin: 10 g/dL — ABNORMAL LOW (ref 13.0–17.0)
Immature Granulocytes: 3 %
Lymphocytes Relative: 3 %
Lymphs Abs: 0.9 10*3/uL (ref 0.7–4.0)
MCH: 27.5 pg (ref 26.0–34.0)
MCHC: 32.1 g/dL (ref 30.0–36.0)
MCV: 85.7 fL (ref 80.0–100.0)
Monocytes Absolute: 0.7 10*3/uL (ref 0.1–1.0)
Monocytes Relative: 2 %
Neutro Abs: 28.8 10*3/uL — ABNORMAL HIGH (ref 1.7–7.7)
Neutrophils Relative %: 92 %
Platelets: 218 10*3/uL (ref 150–400)
RBC: 3.64 MIL/uL — ABNORMAL LOW (ref 4.22–5.81)
RDW: 16.2 % — ABNORMAL HIGH (ref 11.5–15.5)
Smear Review: NORMAL
WBC: 31.4 10*3/uL — ABNORMAL HIGH (ref 4.0–10.5)
nRBC: 0 % (ref 0.0–0.2)

## 2019-03-12 LAB — BASIC METABOLIC PANEL
Anion gap: 13 (ref 5–15)
BUN: 112 mg/dL — ABNORMAL HIGH (ref 8–23)
CO2: 22 mmol/L (ref 22–32)
Calcium: 7.7 mg/dL — ABNORMAL LOW (ref 8.9–10.3)
Chloride: 99 mmol/L (ref 98–111)
Creatinine, Ser: 3.46 mg/dL — ABNORMAL HIGH (ref 0.61–1.24)
GFR calc Af Amer: 20 mL/min — ABNORMAL LOW (ref >60)
GFR calc non Af Amer: 17 mL/min — ABNORMAL LOW (ref >60)
Glucose, Bld: 304 mg/dL — ABNORMAL HIGH (ref 70–99)
Potassium: 3.2 mmol/L — ABNORMAL LOW (ref 3.5–5.1)
Sodium: 134 mmol/L — ABNORMAL LOW (ref 135–145)

## 2019-03-12 LAB — GLUCOSE, CAPILLARY
Glucose-Capillary: 306 mg/dL — ABNORMAL HIGH (ref 70–99)
Glucose-Capillary: 334 mg/dL — ABNORMAL HIGH (ref 70–99)
Glucose-Capillary: 348 mg/dL — ABNORMAL HIGH (ref 70–99)
Glucose-Capillary: 262 mg/dL — ABNORMAL HIGH (ref 70–99)

## 2019-03-12 MED ORDER — INSULIN ASPART 100 UNIT/ML ~~LOC~~ SOLN
3.0000 [IU] | Freq: Three times a day (TID) | SUBCUTANEOUS | Status: DC
  Administered 2019-03-12 – 2019-03-14 (×7): 3 [IU] via SUBCUTANEOUS
  Filled 2019-03-12 (×7): qty 1

## 2019-03-12 MED ORDER — POTASSIUM CHLORIDE CRYS ER 20 MEQ PO TBCR
20.0000 meq | EXTENDED_RELEASE_TABLET | Freq: Once | ORAL | Status: AC
  Administered 2019-03-12: 20 meq via ORAL
  Filled 2019-03-12: qty 1

## 2019-03-12 MED ORDER — IPRATROPIUM-ALBUTEROL 0.5-2.5 (3) MG/3ML IN SOLN
3.0000 mL | Freq: Three times a day (TID) | RESPIRATORY_TRACT | Status: DC
  Administered 2019-03-13 – 2019-03-15 (×6): 3 mL via RESPIRATORY_TRACT
  Filled 2019-03-12 (×7): qty 3

## 2019-03-12 MED ORDER — PANTOPRAZOLE SODIUM 40 MG IV SOLR: 40.0000 mg | Freq: Every day | INTRAVENOUS | Status: DC

## 2019-03-12 MED ORDER — SODIUM CHLORIDE (PF) 0.9 % IJ SOLN
10.0000 mg | Freq: Once | INTRAMUSCULAR | Status: AC
  Administered 2019-03-12: 10 mg via INTRAPLEURAL
  Filled 2019-03-12: qty 10

## 2019-03-12 MED ORDER — PANTOPRAZOLE SODIUM 40 MG PO TBEC
40.0000 mg | DELAYED_RELEASE_TABLET | Freq: Every day | ORAL | Status: DC
  Administered 2019-03-13 – 2019-03-19 (×7): 40 mg via ORAL
  Filled 2019-03-12 (×8): qty 1

## 2019-03-12 MED ORDER — STERILE WATER FOR INJECTION IJ SOLN
5.0000 mg | Freq: Once | RESPIRATORY_TRACT | Status: AC
  Administered 2019-03-12: 5 mg via INTRAPLEURAL
  Filled 2019-03-12: qty 5

## 2019-03-12 MED ORDER — INSULIN ASPART 100 UNIT/ML ~~LOC~~ SOLN: 6.0000 [IU] | Freq: Three times a day (TID) | SUBCUTANEOUS | Status: DC

## 2019-03-12 MED ORDER — PANTOPRAZOLE SODIUM 40 MG PO TBEC: 40.0000 mg | DELAYED_RELEASE_TABLET | Freq: Every day | ORAL | Status: DC

## 2019-03-12 MED ORDER — SODIUM CHLORIDE 0.9 % IV SOLN
3.0000 g | Freq: Two times a day (BID) | INTRAVENOUS | Status: DC
  Administered 2019-03-12 – 2019-03-15 (×6): 3 g via INTRAVENOUS
  Filled 2019-03-12 (×4): qty 3
  Filled 2019-03-12: qty 8
  Filled 2019-03-12 (×2): qty 3

## 2019-03-12 NOTE — Progress Notes (Signed)
PHARMACY NOTE:  ANTIMICROBIAL RENAL DOSAGE ADJUSTMENT  Current antimicrobial regimen includes a mismatch between antimicrobial dosage and estimated renal function.  As per policy approved by the Pharmacy & Therapeutics and Medical Executive Committees, the antimicrobial dosage will be adjusted accordingly.  Current antimicrobial dosage: Unasyn 3g IV Q24hr.   Indication: Aspiration Pneumonia.   Renal Function:  Estimated Creatinine Clearance: 28.9 mL/min (A) (by C-G formula based on SCr of 3.46 mg/dL (H)).    Antimicrobial dosage has been changed to: Unasyn 3g IV Q12hr.   Additional comments: Patient has not received dialysis since 8/7. Renal function continues to improve. Will renally adjust Unasyn based on renal function.   Thank you for allowing pharmacy to be a part of this patient's care.  Colton Lamb, [facility] 03/12/2019 4:51 PM

## 2019-03-12 NOTE — Progress Notes (Signed)
PHARMACIST - PHYSICIAN COMMUNICATION  CONCERNING: IV to Oral Route Change Policy  RECOMMENDATION: This patient is receiving pantoprazole by the intravenous route.  Based on criteria approved by the Pharmacy and Therapeutics Committee, the intravenous medication(s) is/are being converted to the equivalent oral dose form(s).   DESCRIPTION: These criteria include:  The patient is eating (either orally or via tube) and/or has been taking other orally administered medications for a least 24 hours  The patient has no evidence of active gastrointestinal bleeding or impaired GI absorption (gastrectomy, short bowel, patient on TNA or NPO).  If you have questions about this conversion, please contact the Pharmacy Department at  [phone].  Simpson,[name] L, [facility] 03/12/2019 9:24 AM

## 2019-03-12 NOTE — Evaluation (Signed)
Physical Therapy Evaluation Patient Details Name: Colton Lamb MRN: [id number] DOB: [birth year] Today's Date: 03/12/2019   History of Present Illness  PT admitted for sepsis with complicated hospital stay requiring temp HD via R IJ temp cath. Pt has R chest tube with improving R side empyema. Pt has been weaned off BP drip with vitals WNL this date.  Clinical Impression  Pt is a pleasant 68 year old male who was admitted for sepsis. Pt performs bed mobility with mod assist, however unable to fully sit at EOB. With exertion, BP drops slightly with dizziness present. O2 sats on RA decrease, however improve with cues for pursed lip breathing. Pt has current temp IJ HD cath restricting OOB mobility at this time. Chest tube present with significant drainage. Pt demonstrates deficits with strength/mobility/endurance. Pt is very motivated to participate. Would benefit from skilled PT to address above deficits and promote optimal return to PLOF. Recommending CIR placement at this time to return to full functional independence.    Follow Up Recommendations CIR    Equipment Recommendations  Rolling walker with 5" wheels    Recommendations for Other Services       Precautions / Restrictions Precautions Precautions: Fall Restrictions Weight Bearing Restrictions: No      Mobility  Bed Mobility Overal bed mobility: Needs Assistance Bed Mobility: Supine to Sit     Supine to sit: Mod assist     General bed mobility comments: able to initiate moving B LE off bed. Needs significant assistance with trunk stability, unable to fully sit at EOB secondary to weakness/fatigue. Vitals decreased with mobility efforts. Needs +2 for repositioning in bed  Transfers                 General transfer comment: not able to attempt  Ambulation/Gait             General Gait Details: not able to perform  Stairs            Wheelchair Mobility    Modified Rankin (Stroke Patients  Only)       Balance Overall balance assessment: Needs assistance;History of Falls Sitting-balance support: Bilateral upper extremity supported;Feet unsupported Sitting balance-Leahy Scale: Poor                                       Pertinent Vitals/Pain Pain Assessment: No/denies pain    Home Living Family/patient expects to be discharged to:: Private residence Living Arrangements: Alone Available Help at Discharge: Family(parents live in retirement community) Type of Home: House Home Access: Stairs to enter Entrance Stairs-Rails: Right Entrance Stairs-Number of Steps: 3 Home Layout: One level Home Equipment: Cane - single point;Walker - 2 wheels      Prior Function Level of Independence: Independent         Comments: active, only occasionally used SPC. Was able to ambulate "endless distance" PTA     Hand Dominance        Extremity/Trunk Assessment   Upper Extremity Assessment Upper Extremity Assessment: Generalized weakness(B UE grossly 3+/5)    Lower Extremity Assessment Lower Extremity Assessment: Generalized weakness(B LE grossly 3+/5)       Communication   Communication: No difficulties  Cognition Arousal/Alertness: Awake/alert Behavior During Therapy: WFL for tasks assessed/performed Overall Cognitive Status: Within Functional Limits for tasks assessed  General Comments      Exercises Other Exercises Other Exercises: supine ther-ex performed on B LE including AP, SLR, and hip abd/add. 10 reps with cga. Safe technique. O2 sats decrease with exertion to 85% on RA, however quickly improve with rest.   Assessment/Plan    PT Assessment Patient needs continued PT services  PT Problem List Decreased strength;Decreased activity tolerance;Decreased balance;Decreased mobility;Cardiopulmonary status limiting activity       PT Treatment Interventions DME instruction;Gait  training;Therapeutic activities;Therapeutic exercise;Balance training    PT Goals (Current goals can be found in the Care Plan section)  Acute Rehab PT Goals Patient Stated Goal: to get stronger PT Goal Formulation: With patient Time For Goal Achievement: 03/26/19 Potential to Achieve Goals: Good    Frequency 7X/week   Barriers to discharge        Co-evaluation               AM-PAC PT "6 Clicks" Mobility  Outcome Measure Help needed turning from your back to your side while in a flat bed without using bedrails?: A Lot Help needed moving from lying on your back to sitting on the side of a flat bed without using bedrails?: A Lot Help needed moving to and from a bed to a chair (including a wheelchair)?: Total Help needed standing up from a chair using your arms (e.g., wheelchair or bedside chair)?: Total Help needed to walk in hospital room?: Total Help needed climbing 3-5 steps with a railing? : Total 6 Click Score: 8    End of Session Equipment Utilized During Treatment: (chest tube) Activity Tolerance: Patient tolerated treatment well Patient left: in bed Nurse Communication: Mobility status PT Visit Diagnosis: Muscle weakness (generalized) (M62.81);History of falling (Z91.81);Difficulty in walking, not elsewhere classified (R26.2)    Time: 0354-6568 PT Time Calculation (min) (ACUTE ONLY): 22 min   Charges:   PT Evaluation $PT Eval Moderate Complexity: 1 Mod PT Treatments $Therapeutic Exercise: 8-22 mins        [name], PT, DPT [phone]   Tkeya Stencil 03/12/2019, 3:36 PM

## 2019-03-12 NOTE — Progress Notes (Signed)
Report given to [facility] for patient to be transferred to room 210 with telemetry. Patient is alert and oriented with no signs of distress. Patient stated he would call family personally to update about new room.

## 2019-03-12 NOTE — Progress Notes (Addendum)
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 In ED he was placed on BiPAP and code sepsis activated due to Leukocytosis, lactic acidosis, and hypotension. IV abx initiated. CXR showed pulmonary edema, ED gave IVF and lasix judiciously as patient was found to have AKI.  OVERNIGHT EVENTS: Levophed D/C yesterday.  CT showing improvement in empyema Currently on RA, and tolerating well with SpO2 >90% tPA/DNase instilled in CT   SIGNIFICANT EVENTS: 8/3 >CT head: Chronic ventriculomegaly without evidence of focal brain insult. No visible change since February 2018. This is felt most likely secondary to central atrophy. Normal pressure hydrocephalus is not excluded but not favored. 8/3 >Admitted to ICU requiring Bipap  8/3 > Levophed initiated for hypotension 8/4> Thoracentesis: 200 mL hazy yellow fluid sent to lab 8/4> Trialysis RIJ placed 8/4 > 1st HD treatment- no UF 8/5> 2nd HD treatment - no UF 8/5> CT placed to drain right pleural empyema 8/6> TPA instilled in CT 8/7> 3rd HD treatment - no UF 8/7> 2nd round of TPA instilled in CT 8/8> CT Head- parenchymal volume loss greater than expected for age. No acute abnormalities 8/8> CXR 8/10> Levophed D/C 8/10> Chest CT showing improvement in right sided empyema  8/11> tPA/DNase instilled in CT  CULTURES: 8/3 > Blood & Urine cultures- pending, no growth 8/7 8/3 > COVID 19 - negative 8/3 > MRSA PCR- negative 8/3 > Procalcitonin - 27.61 ~ 31.81 ~ 27.22 8/4> Pleural fluid- LD: 5,463 & WBC: 17,693/ gram stain positive for abundant GNR & GPC 8/7> Pleural fluid cytology- moderate viridans  streptococcus, holding for possible anaerobe   ANTIBIOTICS: 8/3 > Cefepime, d/c 8/3 > Metronidazole x 1 dose, d/c'd 8/3 8/3 > Vancomycin x 2 doses, d/c'd 8/3 8/8 > Unasyn started    Review of Systems:  Gen:  Denies  Fever or chills HEENT: Denies blurred vision, double vision, ear pain, eye pain, hearing loss, nose bleeds, sore throat Cardiac:  No dizziness, chest pain or heaviness, chest tightness,edema (improving) Resp:   cough, -sputum production, -shortness of breath,-wheezing, -hemoptysis Gi: Denies swallowing difficulty, stomach pain, nausea or vomiting, diarrhea, constipation, bowel incontinence Gu:  Denies bladder incontinence, burning urine Ext:   Denies Joint pain, stiffness or swelling (in hand, but improving) Skin: Denies  skin rash, easy bruising or bleeding or hives Endoc:  Denies polyuria, polydipsia , polyphagia or weight change Psych:   Denies depression, insomnia or hallucinations  Other:  All other systems negative   VITAL SIGNS: Temp:  [98.7 F (37.1 C)-99.1 F (37.3 C)] 99.1 F (37.3 C) (08/10 2000) Pulse Rate:  [75-107] 103 (08/11 0900) Resp:  [17-24] 18 (08/11 0900) BP: (84-114)/(50-69) 102/66 (08/11 0900) SpO2:  [91 %-99 %] 91 % (08/11 0900)   I/O last 3 completed shifts: In: 1332.3 [P.O.:1170; I.V.:162.3] Out: 3010 [Urine:2500; Chest Tube:510] Total I/O In: 480 [P.O.:480] Out: -    SpO2: 91 % O2 Flow Rate (L/min): 4 L/min FiO2 (%): 40 %   Physical Exam  Constitutional: He is oriented to person, place, and time. He appears well-developed and well-nourished. No distress.  HENT:  Head: Normocephalic and atraumatic.  Mouth/Throat: Oropharynx is clear  and moist.  Eyes: Pupils are equal, round, and reactive to light. No scleral icterus.  Neck:  RIJ Trialysis in place  Cardiovascular: Normal rate. Atrial fibrillation. Exam reveals distant heart sounds.  Pulses:      Dorsalis pedis pulses are 2+ on the right side and 2+ on the left side.    Respiratory: No respiratory distress. He has decreased breath sounds in the right lower field and the left lower field. He has rhonchi in the right upper field and the left upper field.  Right sided chest tube placed   GI: Bowel sounds are decreased. There is no hepatosplenomegaly. There is no abdominal tenderness.  Decreased bowel sounds likely due to body habitus    Musculoskeletal:        General: Edema (3+ bilaterlly in lower legs) present.     Comments: 5+ strength bilaterally in legs  Neurological: He is alert and oriented to person, place, and time.  Skin: Skin is warm and dry.  Psychiatric: He has a normal mood and affect.    I personally reviewed lab work that was obtained in last 24 hrs. CBC    Component Value Date/Time   WBC 31.4 (H) 03/12/2019 0348   RBC 3.64 (L) 03/12/2019 0348   HGB 10.0 (L) 03/12/2019 0348   HGB 13.9 11/22/2013 0301   HCT 31.2 (L) 03/12/2019 0348   HCT 43.3 11/22/2013 0301   PLT 218 03/12/2019 0348   PLT 350 11/22/2013 0301   MCV 85.7 03/12/2019 0348   MCV 81 11/22/2013 0301   MCH 27.5 03/12/2019 0348   MCHC 32.1 03/12/2019 0348   RDW 16.2 (H) 03/12/2019 0348   RDW 16.1 (H) 11/22/2013 0301   LYMPHSABS 0.9 03/12/2019 0348   MONOABS 0.7 03/12/2019 0348   EOSABS 0.0 03/12/2019 0348   BASOSABS 0.1 03/12/2019 0348   BMP Latest Ref Rng & Units 03/12/2019 03/11/2019 03/10/2019  Glucose 70 - 99 mg/dL 304(H) 325(H) 307(H)  BUN 8 - 23 mg/dL 112(H) 112(H) 102(H)  Creatinine 0.61 - 1.24 mg/dL 3.46(H) 3.64(H) 3.99(H)  BUN/Creat Ratio 10 - 24 - - -  Sodium 135 - 145 mmol/L 134(L) 135 136  Potassium 3.5 - 5.1 mmol/L 3.2(L) 3.5 3.4(L)  Chloride 98 - 111 mmol/L 99 98 99  CO2 22 - 32 mmol/L 22 24 24   Calcium 8.9 - 10.3 mg/dL 7.7(L) 7.8(L) 7.6(L)    MEDICATIONS: I have reviewed all medications and confirmed regimen as documented   IMAGING    Ct Chest Wo Contrast  Result Date: 03/11/2019 CLINICAL DATA:  Pleural effusion EXAM: CT CHEST WITHOUT  CONTRAST TECHNIQUE: Multidetector CT imaging of the chest was performed following the standard protocol without IV contrast. COMPARISON:  CT chest dated March 06, 2019 FINDINGS: Cardiovascular: The heart size remains enlarged. There is a small pericardial effusion. The ascending aorta is ectatic measuring approximately 4.1 cm. This is similar to prior study. Coronary artery calcifications are noted. Thoracic aortic calcifications are noted. There is a the dual chamber left-sided pacemaker in place. There is a right-sided central venous catheter with tip terminating near the cavoatrial junction. Mediastinum/Nodes: Again noted are enlarged right hilar lymph nodes, presumably reactive. There is no significant axillary or supraclavicular adenopathy. The thyroid gland is unremarkable. The esophagus is unremarkable. Lungs/Pleura: The previously noted right-sided empyema has significantly decreased in size status post placement of a right-sided pigtail drainage catheter. There remains a small residual right-sided fluid collection. There is persistent collapse of much of the right lower lobe with some  improved expansion from the prior study. There is a trace right-sided pneumothorax likely related to the presence of the drainage catheter. There is a trace left-sided pleural effusion with adjacent atelectasis. Upper Abdomen: The liver contour somewhat nodular. There is cholelithiasis without CT evidence for acute cholecystitis involving the visualized portions of the gallbladder. Musculoskeletal: No chest wall mass or suspicious bone lesions identified. IMPRESSION: 1. Significant interval improvement in the previously demonstrated right-sided empyema. There is a small residual fluid collection in the right pleural space. 2. Persistent but improved collapse/consolidation involving the right lower lobe. 3. Trace left-sided pleural effusion with adjacent atelectasis. 4. Slightly nodular appearance of the liver suggestive of  underlying cirrhosis. 5. Cholelithiasis without CT evidence for acute cholecystitis. 6. Cardiomegaly with a small pericardial effusion which has increased in size from prior study. 7. Right hilar adenopathy, presumably reactive. 8. Ectatic ascending aorta measuring 4.1 cm. Recommend annual imaging followup by CTA or MRA. This recommendation follows 2010 ACCF/AHA/AATS/ACR/ASA/SCA/SCAI/SIR/STS/SVM Guidelines for the Diagnosis and Management of Patients with Thoracic Aortic Disease. Circulation. 2010; 121: Q034-V425. Aortic aneurysm NOS (ICD10-I71.9) Aortic Atherosclerosis (ICD10-I70.0). Electronically Signed   By: Constance Holster M.D.   On: 03/11/2019 15:41     Indwelling Urinary Catheter continued, requirement due to   Reason to continue Indwelling Urinary Catheter strict Intake/Output monitoring for hemodynamic instability          ASSESSMENT AND PLAN SYNOPSIS  68 y.o male with improved acute hypoxic respiratory failure and septic shock secondary to empyema and CAP Strep Virdans pneumonia in the presence of acute renal failure requiring hemodialysis.   Empyema -CT surgery consulted -Chest CT showing improvement  -Chest tube placed  - tPA x 2 dose  - tPA/DNase instilled today    CAP Strep Virdans pneumonia -IV abx, Unasyn  Severe ACUTE Hypoxic  Respiratory Failure -Improved -continue Bronchodilator Therapy -Currently on RA and able to maintain SpO2 >90% -Supplemental oxygen PRN, goal SpO2 > 90%    ACUTE KIDNEY INJURY/Renal Failure -Nephrology consulted  hemodialysis per nephrology, last received 8/7 -follow chem 7 -follow UO- improving  -continue Foley Catheter-assess need -Avoid nephrotoxic agents   SHOCK-SEPSIS -Improved -Continue Midodrine for hypotension -D/C Levophed   -use vasopressors to keep MAP>65 -D/C stress dose steroids   CARDIAC ICU monitoring   GI GI PROPHYLAXIS as indicated  NUTRITIONAL STATUS DIET-->Heart healthy/carb modified  -eating  well Constipation protocol as indicated   ENDO - will use ICU hypoglycemic\Hyperglycemia protocol if needed    ELECTROLYTES -follow labs as needed -replace as needed -pharmacy consultation and following   DVT/GI PRX ordered- heparin injection TRANSFUSIONS AS NEEDED MONITOR FSBS ASSESS the need for LABS   Patient continuing to improve but still remains ill with multiorgan involvement.    Anibal Henderson, PA-S   C. Derrill Kay, MD [facility]

## 2019-03-12 NOTE — Progress Notes (Signed)
Inpatient Diabetes Program Recommendations  AACE/ADA: New Consensus Statement on Inpatient Glycemic Control   Target Ranges:  Prepandial:   less than 140 mg/dL      Peak postprandial:   less than 180 mg/dL (1-2 hours)      Critically ill patients:  140 - 180 mg/dL   Results for LASHAUN, KRAPF (MRN [id number]) as of 03/12/2019 08:04  Ref. Range 03/11/2019 07:31 03/11/2019 11:44 03/11/2019 16:12 03/11/2019 21:52 03/12/2019 07:51  Glucose-Capillary Latest Ref Range: 70 - 99 mg/dL 314 (H) 371 (H) 339 (H) 332 (H) 306 (H)   Results for MONTA, POLICE (MRN [id number]) as of 03/12/2019 08:04  Ref. Range 03/05/2019 05:26  Hemoglobin A1C Latest Ref Range: 4.8 - 5.6 % 6.8 (H)   Review of Glycemic Control  Diabetes history: DM2 Outpatient Diabetes medications: Jardiance 10 mg daily, Metformin 1000 mg BID Current orders for Inpatient glycemic control: Levemir 13 units QHS, Novolog 3 units TID with meals for meal coverage, Novolog 0-9 units AC&HS; Solucortef 50 mg Q6H  Inpatient Diabetes Program Recommendations:   Insulin-Basal: If steroids continued, please consider increasing Levemir to 18 units QHS.  Insulin-Meal Coverage: If steroids are continued, please consider increasing meal coverage to Novolog 6 units TID with meals if patient eats at least 50% of meals.  Thanks, Barnie Alderman, RN, MSN, CDE Diabetes Coordinator Inpatient Diabetes Program [phone] (Team Pager from 8am to 5pm)

## 2019-03-12 NOTE — Progress Notes (Signed)
[place] [facility] [place] NAME: Colton Lamb    MR#:  [id number]  DATE OF BIRTH:  [birth year]  SUBJECTIVE:   Patient CT chest showed improvement in the right-sided empyema yesterday.  Status post TPA to the chest tube again today.  Discussed with intensivist and will plan to do it again tomorrow and repeat imaging.  Patient overall feels a lot better and has no significant complaints other than generalized weakness.  REVIEW OF SYSTEMS:    Review of Systems  Constitutional: Negative for chills and fever.  HENT: Negative for congestion and tinnitus.   Eyes: Negative for blurred vision and double vision.  Respiratory: Negative for cough, shortness of breath and wheezing.   Cardiovascular: Negative for chest pain, orthopnea and PND.  Gastrointestinal: Negative for abdominal pain, diarrhea, nausea and vomiting.  Genitourinary: Negative for dysuria and hematuria.  Neurological: Positive for weakness (generalized). Negative for dizziness, sensory change and focal weakness.  All other systems reviewed and are negative.   Nutrition: Heart Healthy/Carb control Tolerating Diet: Yes Tolerating PT: Await Eval.   DRUG ALLERGIES:   Allergies  Allergen Reactions  . Iodine Other (See Comments)    Shortness of breath, swelling and hives  . Shrimp [Shellfish Allergy] Other (See Comments)    SWELLING    HIVES    SHORTNESS OF BREATH  . Tetracycline Rash    VITALS:  Blood pressure (!) 105/56, pulse (!) 103, temperature 99.1 F (37.3 C), temperature source Oral, resp. rate 20, height 6' (1.829 m), weight 129.9 kg, SpO2 91 %.  PHYSICAL EXAMINATION:   Physical Exam  GENERAL:  68 y.o.-year-old obese patient lying in bed in no acute distress.  EYES: Pupils equal, round, reactive to light and accommodation. No scleral icterus. Extraocular muscles intact.  HEENT: Head atraumatic, normocephalic. Oropharynx and nasopharynx clear.  NECK:  Supple, no jugular  venous distention. No thyroid enlargement, no tenderness.  LUNGS: Good a/e B/l, no wheezing, rales, rhonchi. No use of accessory muscles of respiration. Right sided CT in place with some pus drainage noted.   CARDIOVASCULAR: S1, S2 normal. No murmurs, rubs, or gallops.  ABDOMEN: Soft, nontender, nondistended. Bowel sounds present. No organomegaly or mass.  EXTREMITIES: No cyanosis, clubbing or edema b/l.    NEUROLOGIC: Cranial nerves II through XII are intact. No focal Motor or sensory deficits b/l. Globally weak.    PSYCHIATRIC: The patient is alert and oriented x 3.  SKIN: No obvious rash, lesion, or ulcer.   Right chest wall CT in place with some yellow fluid draining. Foley cath in place with yellow urine draining.     LABORATORY PANEL:   CBC Recent Labs  Lab 03/12/19 0348  WBC 31.4*  HGB 10.0*  HCT 31.2*  PLT 218   ------------------------------------------------------------------------------------------------------------------  Chemistries  Recent Labs  Lab 03/10/19 0511 03/11/19 0459 03/12/19 0348  NA 136 135 134*  K 3.4* 3.5 3.2*  CL 99 98 99  CO2 24 24 22   GLUCOSE 307* 325* 304*  BUN 102* 112* 112*  CREATININE 3.99* 3.64* 3.46*  CALCIUM 7.6* 7.8* 7.7*  MG 2.6* 2.9*  --   AST 39  --   --   ALT 27  --   --   ALKPHOS 118  --   --   BILITOT 0.9  --   --    ------------------------------------------------------------------------------------------------------------------  Cardiac Enzymes No results for input(s): TROPONINI in the last 168 hours. ------------------------------------------------------------------------------------------------------------------  RADIOLOGY:  Ct Chest Wo  Contrast  Result Date: 03/11/2019 CLINICAL DATA:  Pleural effusion EXAM: CT CHEST WITHOUT CONTRAST TECHNIQUE: Multidetector CT imaging of the chest was performed following the standard protocol without IV contrast. COMPARISON:  CT chest dated March 06, 2019 FINDINGS: Cardiovascular:  The heart size remains enlarged. There is a small pericardial effusion. The ascending aorta is ectatic measuring approximately 4.1 cm. This is similar to prior study. Coronary artery calcifications are noted. Thoracic aortic calcifications are noted. There is a the dual chamber left-sided pacemaker in place. There is a right-sided central venous catheter with tip terminating near the cavoatrial junction. Mediastinum/Nodes: Again noted are enlarged right hilar lymph nodes, presumably reactive. There is no significant axillary or supraclavicular adenopathy. The thyroid gland is unremarkable. The esophagus is unremarkable. Lungs/Pleura: The previously noted right-sided empyema has significantly decreased in size status post placement of a right-sided pigtail drainage catheter. There remains a small residual right-sided fluid collection. There is persistent collapse of much of the right lower lobe with some improved expansion from the prior study. There is a trace right-sided pneumothorax likely related to the presence of the drainage catheter. There is a trace left-sided pleural effusion with adjacent atelectasis. Upper Abdomen: The liver contour somewhat nodular. There is cholelithiasis without CT evidence for acute cholecystitis involving the visualized portions of the gallbladder. Musculoskeletal: No chest wall mass or suspicious bone lesions identified. IMPRESSION: 1. Significant interval improvement in the previously demonstrated right-sided empyema. There is a small residual fluid collection in the right pleural space. 2. Persistent but improved collapse/consolidation involving the right lower lobe. 3. Trace left-sided pleural effusion with adjacent atelectasis. 4. Slightly nodular appearance of the liver suggestive of underlying cirrhosis. 5. Cholelithiasis without CT evidence for acute cholecystitis. 6. Cardiomegaly with a small pericardial effusion which has increased in size from prior study. 7. Right hilar  adenopathy, presumably reactive. 8. Ectatic ascending aorta measuring 4.1 cm. Recommend annual imaging followup by CTA or MRA. This recommendation follows 2010 ACCF/AHA/AATS/ACR/ASA/SCA/SCAI/SIR/STS/SVM Guidelines for the Diagnosis and Management of Patients with Thoracic Aortic Disease. Circulation. 2010; 121: W098-J191. Aortic aneurysm NOS (ICD10-I71.9) Aortic Atherosclerosis (ICD10-I70.0). Electronically Signed   By: Constance Holster M.D.   On: 03/11/2019 15:41     ASSESSMENT AND PLAN:   68 year old male with past medical history of paroxysmal atrial fibrillation, obstructive sleep apnea, diabetes, obesity, hypertension, hyperlipidemia, GERD, history of coronary artery disease, history of chronic systolic CHF who presented to the hospital due to worsening shortness of breath.  1.  Sepsis/septic shock- secondary to pneumonia with empyema. -Much improved and hemodynamics also much improved.  Off IV steroids, continue IV Unasyn  2.  Acute respiratory failure with hypoxia-secondary to pneumonia with empyema. -Seen by general surgery, status post chest tube placement on the right.  Body fluid cultures are consistent with strep viridans, Prevotella. -Continue IV Unasyn for now.  CT chest yesterday showing improvement in the right-sided empyema.  No plans for further surgical intervention. - Status post TPA to the CT tube.    3. Pneumonia/Empyema-source of patient's sepsis and hypoxemia.  Status post chest tube placement by CT surgery. . -CT chest x-ray showing significant improvement in his right-sided empyema.   -Discussed with intensivist today and will give TPA while chest tube again today.  May consider repeating tomorrow to see if drainage is slowing down.  Plan for possible CT tube removal in the next 1 to 2 days.  4.  Acute renal failure-patient's baseline creatinines around 1.1 and now elevated to over 3.5. - > 1400cc  in the past 24 hrs and Seen by nephrology and no acute indication for  dialysis. -This is likely ATN from underlying sepsis and hypotension.  Follow BUN/creatinine urine output.  5.  Diabetes type 2 without complication- cont. Levemir, SSI and Novolog with meals.  - appreciate DM coordinator input.   6.  Leukocytosis- secondary to underlying sepsis/pneumonia/empyema complicated with use of steroids. -Follow white cell count which is trending down.   7.  Hyperlipidemia-continue atorvastatin.  8.  COPD-no acute exacerbation.  Continue duo nebs, albuterol nebs.  Plan to transfer to floor today if bed available, Await PT eval.    All the records are reviewed and case discussed with Care Management/Social Worker. Management plans discussed with the patient, family and they are in agreement.  CODE STATUS: Full code  DVT Prophylaxis: Heparin subcu  TOTAL TIME TAKING CARE OF THIS PATIENT: 30 minutes.   POSSIBLE D/C unclear DAYS, DEPENDING ON CLINICAL CONDITION and progress   Henreitta Leber M.D on 03/12/2019 at 3:19 PM  Between 7am to 6pm - Pager - [phone]  After 6pm go to [email or web address] - Proofreader  [facility]  Office  [phone]  CC: Primary care physician; Toni Arthurs, NP

## 2019-03-12 NOTE — Progress Notes (Signed)
[facility]  ROUNDING NOTE   Subjective:  Patient seen at bedside. Urine output 1.4 L over the preceding 24 hours. Renal function slightly better.    Objective:  Vital signs in last 24 hours:  Temp:  [98.7 F (37.1 C)-99.1 F (37.3 C)] 99.1 F (37.3 C) (08/10 2000) Pulse Rate:  [75-107] 103 (08/11 0900) Resp:  [17-24] 18 (08/11 0900) BP: (84-105)/(50-68) 102/66 (08/11 0900) SpO2:  [91 %-99 %] 91 % (08/11 0900)  Weight change:  Filed Weights   03/05/19 1730 03/06/19 1545 03/06/19 1549  Weight: 132.1 kg 129.9 kg 129.9 kg    Intake/Output: I/O last 3 completed shifts: In: 1332.3 [P.O.:1170; I.V.:162.3] Out: 3010 [Urine:2500; Chest Tube:510]   Intake/Output this shift:  Total I/O In: 480 [P.O.:480] Out: -   Physical Exam: General: Critically ill  Head: Normocephalic, atraumatic. Moist oral mucosal membranes  Eyes: Anicteric  Neck: Supple   Lungs:  Bilateral rhonchi, normal effort, chest tube in place  Heart: irregular  Abdomen:  Soft, nontender, obese  Extremities: trace peripheral edema.  Neurologic: Awake, alert, following commands  Skin: No lesions  Access: Right IJ temp HD catheter 8/4    Basic Metabolic Panel: Recent Labs  Lab 03/07/19 2104 03/08/19 0444 03/08/19 2349 03/09/19 0436 03/10/19 0511 03/11/19 0459 03/12/19 0348  NA 137 134* 137 139 136 135 134*  K 4.1 4.1 3.9 4.1 3.4* 3.5 3.2*  CL 98 97* 98 99 99 98 99  CO2 24 23 24 25 24 24 22   GLUCOSE 244* 346* 265* 284* 307* 325* 304*  BUN 73* 90* 72* 77* 102* 112* 112*  CREATININE 3.62* 4.23* 3.55* 3.98* 3.99* 3.64* 3.46*  CALCIUM 7.8* 7.6* 7.6* 7.6* 7.6* 7.8* 7.7*  MG 2.2 2.3 2.2  --  2.6* 2.9*  --   PHOS 4.3 5.5*  --  6.2* 7.6* 7.5*  --     Liver Function Tests: Recent Labs  Lab 03/06/19 0437 03/07/19 0607 03/08/19 0444 03/09/19 0436 03/10/19 0511  AST  --   --  44*  --  39  ALT  --   --  25  --  27  ALKPHOS  --   --  133*  --  118  BILITOT  --   --  1.0  --  0.9   PROT  --   --  5.5*  --  5.4*  ALBUMIN 2.3* 1.9* 1.7* 1.6* 1.6*   No results for input(s): LIPASE, AMYLASE in the last 168 hours. No results for input(s): AMMONIA in the last 168 hours.  CBC: Recent Labs  Lab 03/08/19 0444 03/09/19 0436 03/10/19 0511 03/11/19 0459 03/12/19 0348  WBC 40.1* 38.2* 33.2* 36.2* 31.4*  NEUTROABS  --   --   --   --  28.8*  HGB 11.2* 10.9* 10.7* 10.8* 10.0*  HCT 34.0* 33.3* 33.2* 33.8* 31.2*  MCV 83.1 82.8 84.5 86.0 85.7  PLT 361 327 316 307 218    Cardiac Enzymes: No results for input(s): CKTOTAL, CKMB, CKMBINDEX, TROPONINI in the last 168 hours.  BNP: Invalid input(s): POCBNP  CBG: Recent Labs  Lab 03/11/19 1144 03/11/19 1612 03/11/19 2152 03/12/19 0751 03/12/19 1117  GLUCAP 371* 339* 332* 306* 334*    Microbiology: Results for orders placed or performed during the hospital encounter of 03/04/19  Urine culture     Status: Abnormal   Collection Time: 03/04/19  2:14 PM   Specimen: Urine, Clean Catch  Result Value Ref Range Status   Specimen Description  Final    URINE, CLEAN CATCH Performed at [facility], [address] [place], [place] [address]    Special Requests   Final    NONE Performed at [facility], [address] [place], [place] [address]    Culture (A)  Final    <10,000 COLONIES/mL INSIGNIFICANT GROWTH Performed at [facility] [address], [place], [place] [address]    Report Status 03/06/2019 FINAL  Final  SARS Coronavirus 2 [facility] order, Performed in [facility] hospital lab) Nasopharyngeal Nasopharyngeal Swab     Status: None   Collection Time: 03/04/19  2:17 PM   Specimen: Nasopharyngeal Swab  Result Value Ref Range Status   SARS Coronavirus 2 NEGATIVE NEGATIVE Final    Comment: (NOTE) If result is NEGATIVE SARS-CoV-2 target nucleic acids are NOT DETECTED. The SARS-CoV-2 RNA is generally detectable in upper and lower  respiratory specimens during the acute phase of  infection. The lowest  concentration of SARS-CoV-2 viral copies this assay can detect is 250  copies / mL. A negative result does not preclude SARS-CoV-2 infection  and should not be used as the sole basis for treatment or other  patient management decisions.  A negative result may occur with  improper specimen collection / handling, submission of specimen other  than nasopharyngeal swab, presence of viral mutation(s) within the  areas targeted by this assay, and inadequate number of viral copies  (<250 copies / mL). A negative result must be combined with clinical  observations, patient history, and epidemiological information. If result is POSITIVE SARS-CoV-2 target nucleic acids are DETECTED. The SARS-CoV-2 RNA is generally detectable in upper and lower  respiratory specimens dur ing the acute phase of infection.  Positive  results are indicative of active infection with SARS-CoV-2.  Clinical  correlation with patient history and other diagnostic information is  necessary to determine patient infection status.  Positive results do  not rule out bacterial infection or co-infection with other viruses. If result is PRESUMPTIVE POSTIVE SARS-CoV-2 nucleic acids MAY BE PRESENT.   A presumptive positive result was obtained on the submitted specimen  and confirmed on repeat testing.  While 2019 novel coronavirus  (SARS-CoV-2) nucleic acids may be present in the submitted sample  additional confirmatory testing may be necessary for epidemiological  and / or clinical management purposes  to differentiate between  SARS-CoV-2 and other Sarbecovirus currently known to infect humans.  If clinically indicated additional testing with an alternate test  methodology [phone]) is advised. The SARS-CoV-2 RNA is generally  detectable in upper and lower respiratory sp ecimens during the acute  phase of infection. The expected result is Negative. Fact Sheet for Patients:   StrictlyIdeas.no Fact Sheet for Healthcare Providers: BankingDealers.co.za This test is not yet approved or cleared by the [place] FDA and has been authorized for detection and/or diagnosis of SARS-CoV-2 by FDA under an Emergency Use Authorization (EUA).  This EUA will remain in effect (meaning this test can be used) for the duration of the COVID-19 declaration under Section 564(b)(1) of the Act, 21 U.S.C. section 360bbb-3(b)(1), unless the authorization is terminated or revoked sooner. Performed at [facility], [address] [place], [place] [address]   Blood Culture (routine x 2)     Status: None   Collection Time: 03/04/19  2:55 PM   Specimen: BLOOD  Result Value Ref Range Status   Specimen Description BLOOD BLOOD RIGHT HAND  Final   Special Requests   Final  BOTTLES DRAWN AEROBIC AND ANAEROBIC Blood Culture adequate volume   Culture   Final    NO GROWTH 5 DAYS Performed at [facility], [address] [place], [place] [address]    Report Status 03/09/2019 FINAL  Final  MRSA PCR Screening     Status: None   Collection Time: 03/04/19  8:36 PM   Specimen: Nasopharyngeal  Result Value Ref Range Status   MRSA by PCR NEGATIVE NEGATIVE Final    Comment:        The GeneXpert MRSA Assay (FDA approved for NASAL specimens only), is one component of a comprehensive MRSA colonization surveillance program. It is not intended to diagnose MRSA infection nor to guide or monitor treatment for MRSA infections. Performed at [facility], [address] [place], [place] [address]   Blood Culture (routine x 2)     Status: None   Collection Time: 03/04/19 10:01 PM   Specimen: BLOOD  Result Value Ref Range Status   Specimen Description BLOOD RIGHT ASSIST CONTROL  Final   Special Requests   Final    BOTTLES DRAWN AEROBIC AND ANAEROBIC Blood Culture adequate volume   Culture   Final    NO GROWTH 5  DAYS Performed at [facility], [address] [place], [place] [address]    Report Status 03/09/2019 FINAL  Final  Body fluid culture     Status: None   Collection Time: 03/05/19 11:21 AM   Specimen: [facility] Pleural fluid  Result Value Ref Range Status   Specimen Description   Final    PLEURAL Performed at [facility], [address], [place], [place] [address]    Special Requests   Final    NONE Performed at [facility], [address], [place], [place] [address]    Gram Stain   Final    FEW WBC PRESENT, PREDOMINANTLY PMN ABUNDANT GRAM NEGATIVE RODS ABUNDANT GRAM POSITIVE COCCI FEW GRAM VARIABLE ROD    Culture   Final    MODERATE VIRIDANS STREPTOCOCCUS MODERATE PREVOTELLA BUCCAE FEW PREVOTELLA INTERMEDIA BETA LACTAMASE POSITIVE Performed at [facility], [place] [address], [place], [place] [address]    Report Status 03/11/2019 FINAL  Final    Coagulation Studies: No results for input(s): LABPROT, INR in the last 72 hours.  Urinalysis: No results for input(s): COLORURINE, LABSPEC, PHURINE, GLUCOSEU, HGBUR, BILIRUBINUR, KETONESUR, PROTEINUR, UROBILINOGEN, NITRITE, LEUKOCYTESUR in the last 72 hours.  Invalid input(s): APPERANCEUR    Imaging: Ct Chest Wo Contrast  Result Date: 03/11/2019 CLINICAL DATA:  Pleural effusion EXAM: CT CHEST WITHOUT CONTRAST TECHNIQUE: Multidetector CT imaging of the chest was performed following the standard protocol without IV contrast. COMPARISON:  CT chest dated March 06, 2019 FINDINGS: Cardiovascular: The heart size remains enlarged. There is a small pericardial effusion. The ascending aorta is ectatic measuring approximately 4.1 cm. This is similar to prior study. Coronary artery calcifications are noted. Thoracic aortic calcifications are noted. There is a the dual chamber left-sided pacemaker in place. There is a right-sided central venous catheter with tip terminating near the cavoatrial junction.  Mediastinum/Nodes: Again noted are enlarged right hilar lymph nodes, presumably reactive. There is no significant axillary or supraclavicular adenopathy. The thyroid gland is unremarkable. The esophagus is unremarkable. Lungs/Pleura: The previously noted right-sided empyema has significantly decreased in size status post placement of a right-sided pigtail drainage catheter. There remains a small residual right-sided fluid collection. There is persistent collapse of much of the right lower lobe  with some improved expansion from the prior study. There is a trace right-sided pneumothorax likely related to the presence of the drainage catheter. There is a trace left-sided pleural effusion with adjacent atelectasis. Upper Abdomen: The liver contour somewhat nodular. There is cholelithiasis without CT evidence for acute cholecystitis involving the visualized portions of the gallbladder. Musculoskeletal: No chest wall mass or suspicious bone lesions identified. IMPRESSION: 1. Significant interval improvement in the previously demonstrated right-sided empyema. There is a small residual fluid collection in the right pleural space. 2. Persistent but improved collapse/consolidation involving the right lower lobe. 3. Trace left-sided pleural effusion with adjacent atelectasis. 4. Slightly nodular appearance of the liver suggestive of underlying cirrhosis. 5. Cholelithiasis without CT evidence for acute cholecystitis. 6. Cardiomegaly with a small pericardial effusion which has increased in size from prior study. 7. Right hilar adenopathy, presumably reactive. 8. Ectatic ascending aorta measuring 4.1 cm. Recommend annual imaging followup by CTA or MRA. This recommendation follows 2010 ACCF/AHA/AATS/ACR/ASA/SCA/SCAI/SIR/STS/SVM Guidelines for the Diagnosis and Management of Patients with Thoracic Aortic Disease. Circulation. 2010; 121: R604-V409. Aortic aneurysm NOS (ICD10-I71.9) Aortic Atherosclerosis (ICD10-I70.0).  Electronically Signed   By: Constance Holster M.D.   On: 03/11/2019 15:41     Medications:   . sodium chloride Stopped (03/06/19 1411)  . ampicillin-sulbactam (UNASYN) IV 3 g (03/11/19 2005)   . alteplase (TPA) for intrapleural administration  10 mg Intrapleural Once   And  . pulmozyme (DORNASE) for intrapleural administration  5 mg Intrapleural Once  . aspirin EC  81 mg Oral Daily  . atorvastatin  80 mg Oral Daily  . budesonide (PULMICORT) nebulizer solution  0.25 mg Nebulization BID  . Chlorhexidine Gluconate Cloth  6 each Topical Q0600  . heparin  5,000 Units Subcutaneous Q8H  . insulin aspart  0-9 Units Subcutaneous TID AC & HS  . insulin aspart  3 Units Subcutaneous TID WC  . insulin detemir  13 Units Subcutaneous Daily  . ipratropium-albuterol  3 mL Nebulization Q4H  . lactobacillus  1 g Oral TID WC  . midodrine  10 mg Oral TID WC  . niacin  1,000 mg Oral QHS  . [START ON 03/13/2019] pantoprazole  40 mg Oral Daily  . potassium chloride  20 mEq Oral Once   sodium chloride, acetaminophen **OR** acetaminophen, albuterol, morphine injection, nitroGLYCERIN, ondansetron **OR** ondansetron (ZOFRAN) IV, polyethylene glycol  Assessment/ Plan:  [name] is a 68 y.o. white male with diabetes mellitus type II, hypertension, obstructive sleep apnea, congestive heart failure, atrial fibrillation, hyperlipidemia, Crohn's disease, coronary artery disease, GERD, pacemaker, morbid obesity,  , who was [facility] on acute exacerbation of COPD, acute exacerbation of congestive heart failure, right pleural effusion, acute respiratory failure requiring BIPAP, sepsis with pneumonia and acute renal failure requiring hemodialysis   1. Acute renal failure: baseline creatinine of 1.1, normal GFR on 01/02/2019.  wiith underlying diabetic nephropathy with glucosuria.  -Acute renal failure secondary to ATN from Sepsis -Renal function slightly improved.  Creatinine down to 3.46.  Urine  output was 1.4 L over the preceding 24 hours.  Therefore no indication for dialysis at the moment.  Continue to monitor renal parameters daily.  2. Acute respiratory failure:   Secondary to pneumonia, congestive heart failure and COPD exacerbation And now has chest tube placed via CT guidance on 03/06/2019 Chest tube remains in place.  Dr. Faith Rogue following.  3. Acute exacerbation of systolic congestive heart failure:  7/10 echocardiogram 35-40% Does not appear to be in any  signs of volume overload at the moment.   4. Sepsis with hypotension: Patient weaned off of pressors at the moment.  He remains on Unasyn at the moment.     LOS: 8 Naviyah Schaffert 8/11/202012:03 PM

## 2019-03-12 NOTE — Progress Notes (Signed)
Ch visited with pt after progressive rounds. Pt was alert and had his SIL at his bedside. Pt had a positive affect and welcomed a visit. Ch informed pt about how much progress his has made over the last few days. Pt was relieved to know that he would not be on dialysis permanently and understood that he was not a candidate for the surgical procedure that was proposed to him. Ch informed pt that an alternative method would be used and to help him re-frame what it meant to be a high-risk pt for surgery. Pt shared that he "has some more living to do" as he reflected on his past lifestyle and how he is tried on being hospitalized every 2 years and is committed to making some changes. Ch prayed a prayer of thanksgiving w/ pt and SIL present.      03/12/19 1100  Clinical Encounter Type  Visited With Patient and family together  Visit Type Follow-up;Spiritual support  Spiritual Encounters  Spiritual Needs Prayer;Grief support  Stress Factors  Patient Stress Factors Health changes;Major life changes  Family Stress Factors None identified

## 2019-03-12 NOTE — Evaluation (Signed)
Occupational Therapy Evaluation Patient Details Name: Colton Lamb MRN: [id number] DOB: 1950/10/22 Today's Date: 03/12/2019    History of Present Illness PT admitted for sepsis with complicated hospital stay requiring temp HD via R IJ temp cath. Pt has R chest tube with improving R side empyema. Pt has been weaned off BP drip with vitals WNL this date.   Clinical Impression   Pt seen for OT evaluation this date. Prior to hospital admission, pt was active and independent. He endorses driving and "staying busy". Pt is retired, but regularly works around his home.  Pt lives alone in a 1-level mobile home with 3 steps to enter.  Pt is eager to return to PLOF with improved strength and safety during his meaningful occupations of daily life.  Currently pt demonstrates impairments in strength, endurance, and activity tolerance requiring mod assist for LB ADL. Pt would benefit from skilled OT to address noted impairments and functional limitations (see below for any additional details) in order to maximize safety and independence while minimizing falls risk and caregiver burden.  Upon hospital discharge, recommend pt discharge to CIR to maximize pt safety, independence, and return to PLOF.    Follow Up Recommendations  CIR    Equipment Recommendations  Other (comment)(TBD at next venue of care.)    Recommendations for Other Services       Precautions / Restrictions Precautions Precautions: Fall Restrictions Weight Bearing Restrictions: No      Mobility Bed Mobility Overal bed mobility: Needs Assistance Bed Mobility: Supine to Sit     Supine to sit: Mod assist     General bed mobility comments: Per PT note: "able to initiate moving B LE off bed. Needs significant assistance with trunk stability, unable to fully sit at EOB secondary to weakness/fatigue. Vitals decreased with mobility efforts. Needs +2 for repositioning in bed"  Transfers                 General transfer  comment: not able to attempt    Balance Overall balance assessment: Needs assistance;History of Falls Sitting-balance support: Bilateral upper extremity supported;Feet unsupported Sitting balance-Leahy Scale: Poor                                     ADL either performed or assessed with clinical judgement   ADL                                         General ADL Comments: Pt currently req. mod assist for seated LB bathing and dressing tasks. Currently requires +2 assist for bed mobility primarily for trunk support. Good use of BUE on this date. Self-feeding and grooming with set-up assist at bed level.     Vision Baseline Vision/History: Wears glasses;Cataracts(Hx of bilat cataract sx.) Wears Glasses: At all times Patient Visual Report: No change from baseline       Perception     Praxis      Pertinent Vitals/Pain Pain Assessment: No/denies pain     Hand Dominance Right   Extremity/Trunk Assessment Upper Extremity Assessment Upper Extremity Assessment: Generalized weakness(Grip grossly 4/5. BUE grossly 3/5 with limitations in assessment 2/2 IJ cath)   Lower Extremity Assessment Lower Extremity Assessment: Defer to PT evaluation;Generalized weakness       Communication Communication Communication: No difficulties   Cognition  Arousal/Alertness: Awake/alert Behavior During Therapy: WFL for tasks assessed/performed Overall Cognitive Status: Within Functional Limits for tasks assessed                                     General Comments  IJ triple lumen and chest tube in place at start/end of session.    Exercises Exercises: Other exercises Other Exercises Other Exercises: supine ther-ex performed on B LE including AP, SLR, and hip abd/add. 10 reps with cga. Safe technique. O2 sats decrease with exertion to 85% on RA, however quickly improve with rest. Other Exercises: Supine ther-ex BUE with safety considerations for  IJ on this date. Pt able to return demonstrate elbow flexion/extension, shoulder flexion to 90, wrist flex/ext, and composit digit flexion/ext.   Shoulder Instructions      Home Living Family/patient expects to be discharged to:: Private residence Living Arrangements: Alone Available Help at Discharge: Family(States he has a SIL and 2 sisters in the area available to assist PRN) Type of Home: Mobile home Home Access: Stairs to enter Entrance Stairs-Number of Steps: 3 Entrance Stairs-Rails: Right Home Layout: One level     Bathroom Shower/Tub: Tub/shower unit;Walk-in shower   Bathroom Toilet: Standard     Home Equipment: Cane - single point;Walker - 2 wheels;Hand held shower head          Prior Functioning/Environment Level of Independence: Independent        Comments: Active, only occasionally used SPC. Was able to ambulate "endless distance" PTA; 1 reported fall in past 6 months.        OT Problem List: Decreased strength;Decreased coordination;Cardiopulmonary status limiting activity;Decreased range of motion;Decreased activity tolerance;Decreased safety awareness;Decreased knowledge of use of DME or AE;Impaired balance (sitting and/or standing);Decreased knowledge of precautions      OT Treatment/Interventions: Self-care/ADL training;Therapeutic exercise;Therapeutic activities;Energy conservation;DME and/or AE instruction;Patient/family education;Balance training;Modalities    OT Goals(Current goals can be found in the care plan section) Acute Rehab OT Goals Patient Stated Goal: to get stronger OT Goal Formulation: With patient Time For Goal Achievement: 03/26/19 Potential to Achieve Goals: Good ADL Goals Pt Will Perform Upper Body Dressing: with modified independence;sitting(With LRAD PRN for improved safety and functional independence.) Pt Will Perform Lower Body Dressing: with set-up;with supervision;sit to/from stand;with adaptive equipment(With LRAD PRN for  improved safety and functional independence.) Pt Will Transfer to Toilet: ambulating;bedside commode;with set-up(With LRAD/DME PRN for improved safety and functional independence.)  OT Frequency: Min 3X/week   Barriers to D/C: Inaccessible home environment;Decreased caregiver support          Co-evaluation              AM-PAC OT "6 Clicks" Daily Activity     Outcome Measure Help from another person eating meals?: None Help from another person taking care of personal grooming?: A Little Help from another person toileting, which includes using toliet, bedpan, or urinal?: A Lot Help from another person bathing (including washing, rinsing, drying)?: A Lot Help from another person to put on and taking off regular upper body clothing?: A Little Help from another person to put on and taking off regular lower body clothing?: A Lot 6 Click Score: 16   End of Session    Activity Tolerance: Patient tolerated treatment well Patient left: in bed;with call bell/phone within reach;with bed alarm set  OT Visit Diagnosis: Other abnormalities of gait and mobility (R26.89);Muscle weakness (generalized) (M62.81);History of falling (Z91.81)  Time: 1445-1500 OT Time Calculation (min): 15 min Charges:  OT General Charges $OT Visit: 1 Visit OT Evaluation $OT Eval Moderate Complexity: 1 Mod OT Treatments $Therapeutic Exercise: 8-22 mins  Shara Blazing, M.S., OTR/L Ascom: [phone] 03/12/19, 4:23 PM

## 2019-03-13 LAB — CBC
HCT: 33 % — ABNORMAL LOW (ref 39.0–52.0)
Hemoglobin: 10.7 g/dL — ABNORMAL LOW (ref 13.0–17.0)
MCH: 27.2 pg (ref 26.0–34.0)
MCHC: 32.4 g/dL (ref 30.0–36.0)
MCV: 83.8 fL (ref 80.0–100.0)
Platelets: 213 10*3/uL (ref 150–400)
RBC: 3.94 MIL/uL — ABNORMAL LOW (ref 4.22–5.81)
RDW: 16.1 % — ABNORMAL HIGH (ref 11.5–15.5)
WBC: 31.7 10*3/uL — ABNORMAL HIGH (ref 4.0–10.5)
nRBC: 0 % (ref 0.0–0.2)

## 2019-03-13 LAB — URINALYSIS, COMPLETE (UACMP) WITH MICROSCOPIC
Bacteria, UA: NONE SEEN
Bilirubin Urine: NEGATIVE
Glucose, UA: 50 mg/dL — AB
Ketones, ur: NEGATIVE mg/dL
Nitrite: NEGATIVE
Protein, ur: 30 mg/dL — AB
RBC / HPF: >50 RBC/hpf — ABNORMAL HIGH (ref 0–5)
Specific Gravity, Urine: 1.019 (ref 1.005–1.030)
Squamous Epithelial / HPF: NONE SEEN (ref 0–5)
WBC, UA: >50 WBC/hpf — ABNORMAL HIGH (ref 0–5)
pH: 5 (ref 5.0–8.0)

## 2019-03-13 LAB — BASIC METABOLIC PANEL
Anion gap: 12 (ref 5–15)
BUN: 125 mg/dL — ABNORMAL HIGH (ref 8–23)
CO2: 21 mmol/L — ABNORMAL LOW (ref 22–32)
Calcium: 7.4 mg/dL — ABNORMAL LOW (ref 8.9–10.3)
Chloride: 99 mmol/L (ref 98–111)
Creatinine, Ser: 3.52 mg/dL — ABNORMAL HIGH (ref 0.61–1.24)
GFR calc Af Amer: 20 mL/min — ABNORMAL LOW (ref >60)
GFR calc non Af Amer: 17 mL/min — ABNORMAL LOW (ref >60)
Glucose, Bld: 244 mg/dL — ABNORMAL HIGH (ref 70–99)
Potassium: 2.9 mmol/L — ABNORMAL LOW (ref 3.5–5.1)
Sodium: 132 mmol/L — ABNORMAL LOW (ref 135–145)

## 2019-03-13 LAB — GLUCOSE, CAPILLARY
Glucose-Capillary: 227 mg/dL — ABNORMAL HIGH (ref 70–99)
Glucose-Capillary: 223 mg/dL — ABNORMAL HIGH (ref 70–99)
Glucose-Capillary: 206 mg/dL — ABNORMAL HIGH (ref 70–99)
Glucose-Capillary: 250 mg/dL — ABNORMAL HIGH (ref 70–99)

## 2019-03-13 MED ORDER — POTASSIUM CHLORIDE CRYS ER 20 MEQ PO TBCR
40.0000 meq | EXTENDED_RELEASE_TABLET | Freq: Once | ORAL | Status: AC
  Administered 2019-03-13: 13:00:00 40 meq via ORAL
  Filled 2019-03-13: qty 2

## 2019-03-13 NOTE — Progress Notes (Signed)
[place] [facility] [place] NAME: Colton Lamb    MR#:  [id number]  DATE OF BIRTH:  Nov 13, 1950  SUBJECTIVE:   No acute events overnight, patient denies any worsening shortness of breath or chest pain.  Still having significant drainage from his chest tube.  Status post TPA/pulmozyme yesterday.   REVIEW OF SYSTEMS:    Review of Systems  Constitutional: Negative for chills and fever.  HENT: Negative for congestion and tinnitus.   Eyes: Negative for blurred vision and double vision.  Respiratory: Negative for cough, shortness of breath and wheezing.   Cardiovascular: Negative for chest pain, orthopnea and PND.  Gastrointestinal: Negative for abdominal pain, diarrhea, nausea and vomiting.  Genitourinary: Negative for dysuria and hematuria.  Neurological: Positive for weakness (generalized). Negative for dizziness, sensory change and focal weakness.  All other systems reviewed and are negative.   Nutrition: Heart Healthy/Carb control Tolerating Diet: Yes Tolerating PT: Await Eval.   DRUG ALLERGIES:   Allergies  Allergen Reactions  . Iodine Other (See Comments)    Shortness of breath, swelling and hives  . Shrimp [Shellfish Allergy] Other (See Comments)    SWELLING    HIVES    SHORTNESS OF BREATH  . Tetracycline Rash    VITALS:  Blood pressure 106/75, pulse (!) 113, temperature 97.7 F (36.5 C), temperature source Oral, resp. rate 20, height 6' (1.829 m), weight 129.9 kg, SpO2 95 %.  PHYSICAL EXAMINATION:   Physical Exam  GENERAL:  68 y.o.-year-old obese patient lying in bed in no acute distress.  EYES: Pupils equal, round, reactive to light and accommodation. No scleral icterus. Extraocular muscles intact.  HEENT: Head atraumatic, normocephalic. Oropharynx and nasopharynx clear.  NECK:  Supple, no jugular venous distention. No thyroid enlargement, no tenderness.  LUNGS: Good a/e B/l, no wheezing, rales, rhonchi. No use of  accessory muscles of respiration. Right sided CT in place with some pus drainage noted.   CARDIOVASCULAR: S1, S2 normal. No murmurs, rubs, or gallops.  ABDOMEN: Soft, nontender, nondistended. Bowel sounds present. No organomegaly or mass.  EXTREMITIES: No cyanosis, clubbing or edema b/l.    NEUROLOGIC: Cranial nerves II through XII are intact. No focal Motor or sensory deficits b/l. Globally weak.    PSYCHIATRIC: The patient is alert and oriented x 3.  SKIN: No obvious rash, lesion, or ulcer.   Right chest wall CT in place with some yellow fluid draining. Foley cath in place with yellow urine draining.     LABORATORY PANEL:   CBC Recent Labs  Lab 03/13/19 1003  WBC 31.7*  HGB 10.7*  HCT 33.0*  PLT 213   ------------------------------------------------------------------------------------------------------------------  Chemistries  Recent Labs  Lab 03/10/19 0511 03/11/19 0459  03/13/19 1003  NA 136 135   < > 132*  K 3.4* 3.5   < > 2.9*  CL 99 98   < > 99  CO2 24 24   < > 21*  GLUCOSE 307* 325*   < > 244*  BUN 102* 112*   < > 125*  CREATININE 3.99* 3.64*   < > 3.52*  CALCIUM 7.6* 7.8*   < > 7.4*  MG 2.6* 2.9*  --   --   AST 39  --   --   --   ALT 27  --   --   --   ALKPHOS 118  --   --   --   BILITOT 0.9  --   --   --    < > =  values in this interval not displayed.   ------------------------------------------------------------------------------------------------------------------  Cardiac Enzymes No results for input(s): TROPONINI in the last 168 hours. ------------------------------------------------------------------------------------------------------------------  RADIOLOGY:  No results found.   ASSESSMENT AND PLAN:   68 year old male with past medical history of paroxysmal atrial fibrillation, obstructive sleep apnea, diabetes, obesity, hypertension, hyperlipidemia, GERD, history of coronary artery disease, history of chronic systolic CHF who presented to the  hospital due to worsening shortness of breath.  1.  Sepsis/septic shock- secondary to pneumonia with empyema. -Much improved and hemodynamics also much improved.  Off IV steroids, continue IV Unasyn  2.  Acute respiratory failure with hypoxia-secondary to pneumonia with empyema. -Seen by general surgery, status post chest tube placement on the right.  Body fluid cultures are consistent with strep viridans, Prevotella. -Continue IV Unasyn for now.  CT chest 8/10 showing improvement in the right-sided empyema.   - Continue further care as per CT surgery.  3. Pneumonia/Empyema-source of patient's sepsis and hypoxemia.  Status post chest tube placement by CT surgery. . -CT chest showing significant improvement in his right-sided empyema.   -Discussed with Dr. Faith Rogue and continue TPA/Pulmozyme to improve drainage of the right-sided empyema.  Plan to possibly remove chest tube later this week. -Continue Unasyn for now and chest tube care as per CT surgery.  4.  Acute renal failure-patient's baseline creatinines around 1.1 and now elevated at 3.5. - 925 cc of urine in the past 24 hrs and Seen by nephrology and no acute indication for dialysis. -This is likely ATN from underlying sepsis and hypotension.  Follow BUN/creatinine urine output.  5.  Diabetes type 2 without complication- cont. Levemir, SSI and Novolog with meals.  - appreciate DM coordinator input.   6.  Leukocytosis- secondary to underlying sepsis/pneumonia/empyema complicated with use of steroids. -Follow white cell count which is trending down and will cont to monitor.    7.  Hyperlipidemia-continue atorvastatin.  8.  COPD-no acute exacerbation.  Continue duo nebs, albuterol nebs.  Await PT eval but pt. Will likely need SNF/STR upon discharged. Social Work made aware.   All the records are reviewed and case discussed with Care Management/Social Worker. Management plans discussed with the patient, family and they are in agreement.   CODE STATUS: Full code  DVT Prophylaxis: Heparin subcu  TOTAL TIME TAKING CARE OF THIS PATIENT: 30 minutes.   POSSIBLE D/C unclear DAYS, DEPENDING ON CLINICAL CONDITION and progress   Henreitta Leber M.D on 03/13/2019 at 3:45 PM  Between 7am to 6pm - Pager - [phone]  After 6pm go to [email or web address] - Proofreader  [facility]  Office  [phone]  CC: Primary care physician; Toni Arthurs, NP

## 2019-03-13 NOTE — Progress Notes (Signed)
[facility]  ROUNDING NOTE   Subjective:  Patient moved out of critical care unit. Urine output was 925 cc over the preceding 24 hours. BUN currently 125 with a creatinine of 3.5.    Objective:  Vital signs in last 24 hours:  Temp:  [97.7 F (36.5 C)-98.6 F (37 C)] 97.7 F (36.5 C) (08/12 1210) Pulse Rate:  [77-113] 113 (08/12 1210) Resp:  [19-21] 20 (08/12 1210) BP: (95-109)/(55-75) 106/75 (08/12 1210) SpO2:  [91 %-96 %] 95 % (08/12 1406)  Weight change:  Filed Weights   03/05/19 1730 03/06/19 1545 03/06/19 1549  Weight: 132.1 kg 129.9 kg 129.9 kg    Intake/Output: I/O last 3 completed shifts: In: 1200 [P.O.:1200] Out: 3405 [Urine:1625; Chest Tube:1780]   Intake/Output this shift:  Total I/O In: -  Out: 190 [Chest Tube:190]  Physical Exam: General: No acute distress  Head: Normocephalic, atraumatic. Moist oral mucosal membranes  Eyes: Anicteric  Neck: Supple   Lungs:  Bilateral rhonchi, normal effort, chest tube in place  Heart: irregular  Abdomen:  Soft, nontender, obese  Extremities: trace peripheral edema.  Neurologic: Awake, alert, following commands  Skin: No lesions  Access: Right IJ temp HD catheter 8/4    Basic Metabolic Panel: Recent Labs  Lab 03/07/19 2104 03/08/19 0444 03/08/19 2349 03/09/19 0436 03/10/19 0511 03/11/19 0459 03/12/19 0348 03/13/19 1003  NA 137 134* 137 139 136 135 134* 132*  K 4.1 4.1 3.9 4.1 3.4* 3.5 3.2* 2.9*  CL 98 97* 98 99 99 98 99 99  CO2 24 23 24 25 24 24 22  21*  GLUCOSE 244* 346* 265* 284* 307* 325* 304* 244*  BUN 73* 90* 72* 77* 102* 112* 112* 125*  CREATININE 3.62* 4.23* 3.55* 3.98* 3.99* 3.64* 3.46* 3.52*  CALCIUM 7.8* 7.6* 7.6* 7.6* 7.6* 7.8* 7.7* 7.4*  MG 2.2 2.3 2.2  --  2.6* 2.9*  --   --   PHOS 4.3 5.5*  --  6.2* 7.6* 7.5*  --   --     Liver Function Tests: Recent Labs  Lab 03/07/19 0607 03/08/19 0444 03/09/19 0436 03/10/19 0511  AST  --  44*  --  39  ALT  --  25  --  27   ALKPHOS  --  133*  --  118  BILITOT  --  1.0  --  0.9  PROT  --  5.5*  --  5.4*  ALBUMIN 1.9* 1.7* 1.6* 1.6*   No results for input(s): LIPASE, AMYLASE in the last 168 hours. No results for input(s): AMMONIA in the last 168 hours.  CBC: Recent Labs  Lab 03/09/19 0436 03/10/19 0511 03/11/19 0459 03/12/19 0348 03/13/19 1003  WBC 38.2* 33.2* 36.2* 31.4* 31.7*  NEUTROABS  --   --   --  28.8*  --   HGB 10.9* 10.7* 10.8* 10.0* 10.7*  HCT 33.3* 33.2* 33.8* 31.2* 33.0*  MCV 82.8 84.5 86.0 85.7 83.8  PLT 327 316 307 218 213    Cardiac Enzymes: No results for input(s): CKTOTAL, CKMB, CKMBINDEX, TROPONINI in the last 168 hours.  BNP: Invalid input(s): POCBNP  CBG: Recent Labs  Lab 03/12/19 1117 03/12/19 1618 03/12/19 2112 03/13/19 0800 03/13/19 1210  GLUCAP 334* 348* 262* 227* 223*    Microbiology: Results for orders placed or performed during the hospital encounter of 03/04/19  Urine culture     Status: Abnormal   Collection Time: 03/04/19  2:14 PM   Specimen: Urine, Clean Catch  Result Value Ref Range  Status   Specimen Description   Final    URINE, CLEAN CATCH Performed at [facility], [address], [place], [place] [address]    Special Requests   Final    NONE Performed at [facility], [address] [place], [place] [address]    Culture (A)  Final    <10,000 COLONIES/mL INSIGNIFICANT GROWTH Performed at [facility] [address], [place], [place] [address]    Report Status 03/06/2019 FINAL  Final  SARS Coronavirus 2 [employer] order, Performed in [facility] hospital lab) Nasopharyngeal Nasopharyngeal Swab     Status: None   Collection Time: 03/04/19  2:17 PM   Specimen: Nasopharyngeal Swab  Result Value Ref Range Status   SARS Coronavirus 2 NEGATIVE NEGATIVE Final    Comment: (NOTE) If result is NEGATIVE SARS-CoV-2 target nucleic acids are NOT DETECTED. The SARS-CoV-2 RNA is generally detectable in upper and lower   respiratory specimens during the acute phase of infection. The lowest  concentration of SARS-CoV-2 viral copies this assay can detect is 250  copies / mL. A negative result does not preclude SARS-CoV-2 infection  and should not be used as the sole basis for treatment or other  patient management decisions.  A negative result may occur with  improper specimen collection / handling, submission of specimen other  than nasopharyngeal swab, presence of viral mutation(s) within the  areas targeted by this assay, and inadequate number of viral copies  (<250 copies / mL). A negative result must be combined with clinical  observations, patient history, and epidemiological information. If result is POSITIVE SARS-CoV-2 target nucleic acids are DETECTED. The SARS-CoV-2 RNA is generally detectable in upper and lower  respiratory specimens dur ing the acute phase of infection.  Positive  results are indicative of active infection with SARS-CoV-2.  Clinical  correlation with patient history and other diagnostic information is  necessary to determine patient infection status.  Positive results do  not rule out bacterial infection or co-infection with other viruses. If result is PRESUMPTIVE POSTIVE SARS-CoV-2 nucleic acids MAY BE PRESENT.   A presumptive positive result was obtained on the submitted specimen  and confirmed on repeat testing.  While 2019 novel coronavirus  (SARS-CoV-2) nucleic acids may be present in the submitted sample  additional confirmatory testing may be necessary for epidemiological  and / or clinical management purposes  to differentiate between  SARS-CoV-2 and other Sarbecovirus currently known to infect humans.  If clinically indicated additional testing with an alternate test  methodology [phone]) is advised. The SARS-CoV-2 RNA is generally  detectable in upper and lower respiratory sp ecimens during the acute  phase of infection. The expected result is Negative. Fact  Sheet for Patients:  StrictlyIdeas.no Fact Sheet for Healthcare Providers: BankingDealers.co.za This test is not yet approved or cleared by the [place] FDA and has been authorized for detection and/or diagnosis of SARS-CoV-2 by FDA under an Emergency Use Authorization (EUA).  This EUA will remain in effect (meaning this test can be used) for the duration of the COVID-19 declaration under Section 564(b)(1) of the Act, 21 U.S.C. section 360bbb-3(b)(1), unless the authorization is terminated or revoked sooner. Performed at [facility], [address] [place],  [address]   Blood Culture (routine x 2)     Status: None   Collection Time: 03/04/19  2:55 PM   Specimen: BLOOD  Result Value Ref Range Status   Specimen Description BLOOD BLOOD RIGHT HAND  Final  Special Requests   Final    BOTTLES DRAWN AEROBIC AND ANAEROBIC Blood Culture adequate volume   Culture   Final    NO GROWTH 5 DAYS Performed at [facility], [address] [place], [place] [address]    Report Status 03/09/2019 FINAL  Final  MRSA PCR Screening     Status: None   Collection Time: 03/04/19  8:36 PM   Specimen: Nasopharyngeal  Result Value Ref Range Status   MRSA by PCR NEGATIVE NEGATIVE Final    Comment:        The GeneXpert MRSA Assay (FDA approved for NASAL specimens only), is one component of a comprehensive MRSA colonization surveillance program. It is not intended to diagnose MRSA infection nor to guide or monitor treatment for MRSA infections. Performed at [facility], [address] [place], [place] [address]   Blood Culture (routine x 2)     Status: None   Collection Time: 03/04/19 10:01 PM   Specimen: BLOOD  Result Value Ref Range Status   Specimen Description BLOOD RIGHT ASSIST CONTROL  Final   Special Requests   Final    BOTTLES DRAWN AEROBIC AND ANAEROBIC Blood Culture adequate volume   Culture    Final    NO GROWTH 5 DAYS Performed at [facility], [address], [place], [place] [address]    Report Status 03/09/2019 FINAL  Final  Body fluid culture     Status: None   Collection Time: 03/05/19 11:21 AM   Specimen: [facility] Cytology Pleural fluid  Result Value Ref Range Status   Specimen Description   Final    PLEURAL Performed at [facility], [address], [place], [place] [address]    Special Requests   Final    NONE Performed at [facility], [address] [place], [place] [address]    Gram Stain   Final    FEW WBC PRESENT, PREDOMINANTLY PMN ABUNDANT GRAM NEGATIVE RODS ABUNDANT GRAM POSITIVE COCCI FEW GRAM VARIABLE ROD    Culture   Final    MODERATE VIRIDANS STREPTOCOCCUS MODERATE PREVOTELLA BUCCAE FEW PREVOTELLA INTERMEDIA BETA LACTAMASE POSITIVE Performed at [facility], [place] [address], [place], [place] [address]    Report Status 03/11/2019 FINAL  Final    Coagulation Studies: No results for input(s): LABPROT, INR in the last 72 hours.  Urinalysis: Recent Labs    03/13/19 0215  COLORURINE AMBER*  LABSPEC 1.019  PHURINE 5.0  GLUCOSEU 50*  HGBUR LARGE*  BILIRUBINUR NEGATIVE  KETONESUR NEGATIVE  PROTEINUR 30*  NITRITE NEGATIVE  LEUKOCYTESUR TRACE*      Imaging: Ct Chest Wo Contrast  Result Date: 03/11/2019 CLINICAL DATA:  Pleural effusion EXAM: CT CHEST WITHOUT CONTRAST TECHNIQUE: Multidetector CT imaging of the chest was performed following the standard protocol without IV contrast. COMPARISON:  CT chest dated March 06, 2019 FINDINGS: Cardiovascular: The heart size remains enlarged. There is a small pericardial effusion. The ascending aorta is ectatic measuring approximately 4.1 cm. This is similar to prior study. Coronary artery calcifications are noted. Thoracic aortic calcifications are noted. There is a the dual chamber left-sided pacemaker in place. There is a right-sided central venous catheter with tip  terminating near the cavoatrial junction. Mediastinum/Nodes: Again noted are enlarged right hilar lymph nodes, presumably reactive. There is no significant axillary or supraclavicular adenopathy. The thyroid gland is unremarkable. The esophagus is unremarkable. Lungs/Pleura: The previously noted right-sided empyema has significantly decreased in size status post placement of a  right-sided pigtail drainage catheter. There remains a small residual right-sided fluid collection. There is persistent collapse of much of the right lower lobe with some improved expansion from the prior study. There is a trace right-sided pneumothorax likely related to the presence of the drainage catheter. There is a trace left-sided pleural effusion with adjacent atelectasis. Upper Abdomen: The liver contour somewhat nodular. There is cholelithiasis without CT evidence for acute cholecystitis involving the visualized portions of the gallbladder. Musculoskeletal: No chest wall mass or suspicious bone lesions identified. IMPRESSION: 1. Significant interval improvement in the previously demonstrated right-sided empyema. There is a small residual fluid collection in the right pleural space. 2. Persistent but improved collapse/consolidation involving the right lower lobe. 3. Trace left-sided pleural effusion with adjacent atelectasis. 4. Slightly nodular appearance of the liver suggestive of underlying cirrhosis. 5. Cholelithiasis without CT evidence for acute cholecystitis. 6. Cardiomegaly with a small pericardial effusion which has increased in size from prior study. 7. Right hilar adenopathy, presumably reactive. 8. Ectatic ascending aorta measuring 4.1 cm. Recommend annual imaging followup by CTA or MRA. This recommendation follows 2010 ACCF/AHA/AATS/ACR/ASA/SCA/SCAI/SIR/STS/SVM Guidelines for the Diagnosis and Management of Patients with Thoracic Aortic Disease. Circulation. 2010; 121: F751-W258. Aortic aneurysm NOS (ICD10-I71.9) Aortic  Atherosclerosis (ICD10-I70.0). Electronically Signed   By: Constance Holster M.D.   On: 03/11/2019 15:41     Medications:   . sodium chloride Stopped (03/06/19 1411)  . ampicillin-sulbactam (UNASYN) IV 3 g (03/13/19 0933)   . aspirin EC  81 mg Oral Daily  . atorvastatin  80 mg Oral Daily  . budesonide (PULMICORT) nebulizer solution  0.25 mg Nebulization BID  . Chlorhexidine Gluconate Cloth  6 each Topical Q0600  . heparin  5,000 Units Subcutaneous Q8H  . insulin aspart  0-9 Units Subcutaneous TID AC & HS  . insulin aspart  3 Units Subcutaneous TID WC  . insulin detemir  13 Units Subcutaneous Daily  . ipratropium-albuterol  3 mL Nebulization TID  . lactobacillus  1 g Oral TID WC  . midodrine  10 mg Oral TID WC  . niacin  1,000 mg Oral QHS  . pantoprazole  40 mg Oral Daily   sodium chloride, acetaminophen **OR** acetaminophen, albuterol, morphine injection, nitroGLYCERIN, ondansetron **OR** ondansetron (ZOFRAN) IV, polyethylene glycol  Assessment/ Plan:  [name] is a 68 y.o. white male with diabetes mellitus type II, hypertension, obstructive sleep apnea, congestive heart failure, atrial fibrillation, hyperlipidemia, Crohn's disease, coronary artery disease, GERD, pacemaker, morbid obesity,  , who was admitted to [facility] on acute exacerbation of COPD, acute exacerbation of congestive heart failure, right pleural effusion, acute respiratory failure requiring BIPAP, sepsis with pneumonia and acute renal failure requiring hemodialysis   1. Acute renal failure: baseline creatinine of 1.1, normal GFR on 01/02/2019.  wiith underlying diabetic nephropathy with glucosuria.  -Acute renal failure secondary to ATN from Sepsis -Renal function slightly improved.  Creatinine down to 3.46.  Urine output was 1.4 L over the preceding 24 hours.  Therefore no indication for dialysis at the moment.  Continue to monitor renal parameters daily.  2. Acute respiratory failure:   Secondary to  pneumonia, congestive heart failure and COPD exacerbation Chest tube placed via CT guidance on 03/06/2019 -Breathing comfortably at the moment.  Chest tube still in place.  Dr. Faith Rogue following.  Also on Unasyn.  3. Acute exacerbation of systolic congestive heart failure:  7/10 echocardiogram 35-40% No sign of fluid overload at the moment.   4. Sepsis with hypotension: Maintain the  patient on midodrine.   LOS: 9 Jetty Berland 8/12/20203:25 PM

## 2019-03-13 NOTE — Progress Notes (Signed)
Rehab Admissions Coordinator Note:  Patient was screened by Michel Santee for appropriateness for an Inpatient Acute Rehab Consult.  At this time, note pt has only completed partial bed mobility with PT/OT. I will follow for 1-2 more days to see if pt able to increase mobility tolerance before determining whether he might be a candidate for CIR.   Michel Santee 03/13/2019, 9:10 AM  I can be reached at [phone].

## 2019-03-13 NOTE — Progress Notes (Signed)
Called Dr. Jannifer Franklin regarding patient's urine that is now tea colored with sediment. Orders were placed for urinalysis. Will continue to monitor.  Phoebe Sharps N  03/13/2019  2:20 AM

## 2019-03-13 NOTE — Progress Notes (Signed)
Physical Therapy Treatment Patient Details Name: Colton Lamb MRN: [id number] DOB: 01-31-1951 Today's Date: 03/13/2019    History of Present Illness PT admitted for sepsis with complicated hospital stay requiring temp HD via R IJ temp cath. Pt has R chest tube with improving R side empyema. Pt has been weaned off BP drip with vitals WNL this date.    PT Comments    Pt agreeable and motivated to perform therapy. Pt requires +2 assist, however able to perform transfer to EOB for sitting tolerance along with limited balance there-ex. Fatigues with exertion, however sats remain WNL on RA. Weakness present in B UE/LE and OOB mobility limited by IJ temp cath. Discussed removal possibility with RN this date. Pt is pleasant and oriented x 4. Pt is not at baseline at this time. Will continue to progress.   Follow Up Recommendations  CIR     Equipment Recommendations  Rolling walker with 5" wheels    Recommendations for Other Services       Precautions / Restrictions Precautions Precautions: Fall Restrictions Weight Bearing Restrictions: No    Mobility  Bed Mobility Overal bed mobility: Needs Assistance Bed Mobility: Supine to Sit     Supine to sit: Mod assist;+2 for physical assistance     General bed mobility comments: able to initiate mobility of B LEs, however needs significant assist with trunk support to sit at EOB. Once seated, able to maintain seated position with B UE support for approx 5 minutes prior to fatigue. +2 for return supine and repositioning in bed.  Transfers                 General transfer comment: unable to attempt due to temp IJ HD cath  Ambulation/Gait                 Stairs             Wheelchair Mobility    Modified Rankin (Stroke Patients Only)       Balance Overall balance assessment: Needs assistance;History of Falls Sitting-balance support: Bilateral upper extremity supported;Feet unsupported Sitting balance-Leahy  Scale: Fair                                      Cognition Arousal/Alertness: Awake/alert Behavior During Therapy: WFL for tasks assessed/performed Overall Cognitive Status: Within Functional Limits for tasks assessed                                        Exercises Other Exercises Other Exercises: supine ther-ex performed on B LE including AP, SLRs, SAQ, hip abd/add, heel slides, and resisted elbow flexion/extension x 12 reps with cga. Cues for sequencing and technique. Other Exercises: Once seated at EOB, able to perform forward reaching for glove using L hand, needing mod assist for trunk flexion/stability. 3 reps performed. Attempted lateral scooting up towards side of bed with max assist. Pt then fatigues and request to return supine.    General Comments        Pertinent Vitals/Pain Pain Assessment: No/denies pain    Home Living                      Prior Function            PT Goals (current goals can now be  found in the care plan section) Acute Rehab PT Goals Patient Stated Goal: to get stronger PT Goal Formulation: With patient Time For Goal Achievement: 03/26/19 Potential to Achieve Goals: Good Progress towards PT goals: Progressing toward goals    Frequency    7X/week      PT Plan Current plan remains appropriate    Co-evaluation              AM-PAC PT "6 Clicks" Mobility   Outcome Measure  Help needed turning from your back to your side while in a flat bed without using bedrails?: A Lot Help needed moving from lying on your back to sitting on the side of a flat bed without using bedrails?: A Lot Help needed moving to and from a bed to a chair (including a wheelchair)?: Total Help needed standing up from a chair using your arms (e.g., wheelchair or bedside chair)?: Total Help needed to walk in hospital room?: Total Help needed climbing 3-5 steps with a railing? : Total 6 Click Score: 8    End of  Session Equipment Utilized During Treatment: (chest tube, cath, IV) Activity Tolerance: Patient tolerated treatment well Patient left: in bed;with bed alarm set Nurse Communication: Mobility status PT Visit Diagnosis: Muscle weakness (generalized) (M62.81);History of falling (Z91.81);Difficulty in walking, not elsewhere classified (R26.2)     Time: 6681-5947 PT Time Calculation (min) (ACUTE ONLY): 23 min  Charges:  $Therapeutic Exercise: 8-22 mins $Therapeutic Activity: 8-22 mins                     [name], PT, DPT [phone]    Colton Lamb 03/13/2019, 12:11 PM

## 2019-03-13 NOTE — Progress Notes (Signed)
Occupational Therapy Treatment Patient Details Name: Colton Lamb MRN: [id number] DOB: April 18, 1951 Today's Date: 03/13/2019    History of present illness PT admitted for sepsis with complicated hospital stay requiring temp HD via R IJ temp cath. Pt has R chest tube with improving R side empyema. Pt has been weaned off BP drip with vitals WNL this date.   OT comments   Pt seen for education in deep breathing techniques and introduction to basic energy conservation tech for ADLs since his functional endurance for self care is very limited.  He was able to complete 1 set of 10 shoulder flexion exercises while seated upright in bed with increase in SOB but motivated to keep completing exercises throughout the day.  He lives at home alone and was asking questions about different levels of rehab since he wants to get back to PLOF.  He continues to need mod to max assist for LB dressing skills due to decreased functional endurance, chest tube, and general deconditioning. He is able to complete self feeding and grooming skills and UB bathing with no assist after set up.  Assist and cues needed to be careful with chest tube.  Patient is a good candidate for CIR.  Follow Up Recommendations       Equipment Recommendations       Recommendations for Other Services      Precautions / Restrictions Precautions Precautions: Fall Restrictions Weight Bearing Restrictions: No       Mobility Bed Mobility Overal bed mobility: Needs Assistance Bed Mobility: Supine to Sit     Supine to sit: Mod assist;+2 for physical assistance     General bed mobility comments: able to initiate mobility of B LEs, however needs significant assist with trunk support to sit at EOB. Once seated, able to maintain seated position with B UE support for approx 5 minutes prior to fatigue. +2 for return supine and repositioning in bed.  Transfers                 General transfer comment: unable to attempt due to temp  IJ HD cath    Balance Overall balance assessment: Needs assistance;History of Falls Sitting-balance support: Bilateral upper extremity supported;Feet unsupported Sitting balance-Leahy Scale: Fair                                     ADL either performed or assessed with clinical judgement   ADL                                         General ADL Comments: Pt seen for educated in deep breathing techniques and introduction to basic energy conservation tech for ADLs since his functional endurance for self care is very limited.  He was able to complete 1 set of 10 shoulder flexion exercises while seated upright in bed with increase in SOB but motivated to keep completing exercises throughout the day.  He lives at home alone and was asking questions about different levels of rehab since he wants to get back to PLOF.  He continues to need mod to max assist for LB dressing skills due to decreased functional endurance, chest tube, and general deconditioning. He is able to complete self feeding and grooming skills and UB bathing with no assist after set up.  Assist and cues  needed to be careful with chest tube.     Vision       Perception     Praxis      Cognition Arousal/Alertness: Awake/alert Behavior During Therapy: WFL for tasks assessed/performed Overall Cognitive Status: Within Functional Limits for tasks assessed                                          Exercises Other Exercises Other Exercises: supine ther-ex performed on B LE including AP, SLRs, SAQ, hip abd/add, heel slides, and resisted elbow flexion/extension x 12 reps with cga. Cues for sequencing and technique. Other Exercises: Once seated at EOB, able to perform forward reaching for glove using L hand, needing mod assist for trunk flexion/stability. 3 reps performed. Attempted lateral scooting up towards side of bed with max assist. Pt then fatigues and request to return  supine.   Shoulder Instructions       General Comments      Pertinent Vitals/ Pain       Pain Assessment: 0-10 Pain Location: right side of chest near chest tube Pain Descriptors / Indicators: Discomfort Pain Intervention(s): Limited activity within patient's tolerance;Monitored during session;Premedicated before session;Repositioned;Utilized relaxation techniques  Home Living                                          Prior Functioning/Environment              Frequency  Min 3X/week        Progress Toward Goals  OT Goals(current goals can now be found in the care plan section)  Progress towards OT goals: Progressing toward goals  Acute Rehab OT Goals Patient Stated Goal: to get stronger OT Goal Formulation: With patient Time For Goal Achievement: 03/26/19 Potential to Achieve Goals: Good  Plan Discharge plan remains appropriate    Co-evaluation                 AM-PAC OT "6 Clicks" Daily Activity     Outcome Measure   Help from another person eating meals?: None Help from another person taking care of personal grooming?: None Help from another person toileting, which includes using toliet, bedpan, or urinal?: A Lot Help from another person bathing (including washing, rinsing, drying)?: A Lot Help from another person to put on and taking off regular upper body clothing?: A Little Help from another person to put on and taking off regular lower body clothing?: A Lot 6 Click Score: 17    End of Session    OT Visit Diagnosis: Other abnormalities of gait and mobility (R26.89);Muscle weakness (generalized) (M62.81);History of falling (Z91.81)   Activity Tolerance Patient tolerated treatment well   Patient Left in bed;with call bell/phone within reach;with bed alarm set   Nurse Communication          Time: 7939-0300 OT Time Calculation (min): 25 min  Charges: OT General Charges $OT Visit: 1 Visit OT Treatments $Self Care/Home  Management : 23-37 mins  Chrys Racer, OTR/L, [place] ascom [phone] 03/13/19, 1:50 PM

## 2019-03-14 ENCOUNTER — Telehealth: Payer: Self-pay

## 2019-03-14 DIAGNOSIS — J90 Pleural effusion, not elsewhere classified: Secondary | ICD-10-CM

## 2019-03-14 LAB — CBC
HCT: 32.9 % — ABNORMAL LOW (ref 39.0–52.0)
Hemoglobin: 10.4 g/dL — ABNORMAL LOW (ref 13.0–17.0)
MCH: 27.1 pg (ref 26.0–34.0)
MCHC: 31.6 g/dL (ref 30.0–36.0)
MCV: 85.7 fL (ref 80.0–100.0)
Platelets: 214 10*3/uL (ref 150–400)
RBC: 3.84 MIL/uL — ABNORMAL LOW (ref 4.22–5.81)
RDW: 16.2 % — ABNORMAL HIGH (ref 11.5–15.5)
WBC: 25.3 10*3/uL — ABNORMAL HIGH (ref 4.0–10.5)
nRBC: 0 % (ref 0.0–0.2)

## 2019-03-14 LAB — PROTEIN / CREATININE RATIO, URINE
Creatinine, Urine: 101 mg/dL
Protein Creatinine Ratio: 0.65 mg/mg{Cre} — ABNORMAL HIGH (ref 0.00–0.15)
Total Protein, Urine: 66 mg/dL

## 2019-03-14 LAB — GLUCOSE, CAPILLARY
Glucose-Capillary: 182 mg/dL — ABNORMAL HIGH (ref 70–99)
Glucose-Capillary: 185 mg/dL — ABNORMAL HIGH (ref 70–99)
Glucose-Capillary: 287 mg/dL — ABNORMAL HIGH (ref 70–99)
Glucose-Capillary: 238 mg/dL — ABNORMAL HIGH (ref 70–99)

## 2019-03-14 LAB — BASIC METABOLIC PANEL
Anion gap: 12 (ref 5–15)
BUN: 121 mg/dL — ABNORMAL HIGH (ref 8–23)
CO2: 25 mmol/L (ref 22–32)
Calcium: 7.7 mg/dL — ABNORMAL LOW (ref 8.9–10.3)
Chloride: 97 mmol/L — ABNORMAL LOW (ref 98–111)
Creatinine, Ser: 3.32 mg/dL — ABNORMAL HIGH (ref 0.61–1.24)
GFR calc Af Amer: 21 mL/min — ABNORMAL LOW (ref >60)
GFR calc non Af Amer: 18 mL/min — ABNORMAL LOW (ref >60)
Glucose, Bld: 201 mg/dL — ABNORMAL HIGH (ref 70–99)
Potassium: 3.4 mmol/L — ABNORMAL LOW (ref 3.5–5.1)
Sodium: 134 mmol/L — ABNORMAL LOW (ref 135–145)

## 2019-03-14 LAB — MAGNESIUM: Magnesium: 2.9 mg/dL — ABNORMAL HIGH (ref 1.7–2.4)

## 2019-03-14 MED ORDER — INSULIN ASPART 100 UNIT/ML ~~LOC~~ SOLN
5.0000 [IU] | Freq: Three times a day (TID) | SUBCUTANEOUS | Status: DC
  Administered 2019-03-14 – 2019-03-19 (×14): 5 [IU] via SUBCUTANEOUS
  Filled 2019-03-14 (×15): qty 1

## 2019-03-14 MED ORDER — NEPRO/CARBSTEADY PO LIQD
237.0000 mL | Freq: Two times a day (BID) | ORAL | Status: DC
  Administered 2019-03-14 – 2019-03-18 (×3): 237 mL via ORAL

## 2019-03-14 NOTE — Progress Notes (Signed)
[facility]  ROUNDING NOTE   Subjective:  Patient continues to have good urine output. Urine output was 1.6 L over the preceding 24 hours. BUN currently 121 with a creatinine of 3.3.  Objective:  Vital signs in last 24 hours:  Temp:  [97.5 F (36.4 C)-97.7 F (36.5 C)] 97.5 F (36.4 C) (08/13 0619) Pulse Rate:  [94-113] 94 (08/13 0619) Resp:  [20] 20 (08/13 0619) BP: (106-110)/(57-75) 110/57 (08/13 0619) SpO2:  [95 %-96 %] 96 % (08/13 0619)  Weight change:  Filed Weights   03/05/19 1730 03/06/19 1545 03/06/19 1549  Weight: 132.1 kg 129.9 kg 129.9 kg    Intake/Output: I/O last 3 completed shifts: In: 320.8 [IV Piggyback:320.8] Out: 3160 [Urine:1850; Chest Tube:1310]   Intake/Output this shift:  No intake/output data recorded.  Physical Exam: General: No acute distress  Head: Normocephalic, atraumatic. Moist oral mucosal membranes  Eyes: Anicteric  Neck: Supple   Lungs:  Bilateral rhonchi, normal effort, chest tube in place  Heart: irregular  Abdomen:  Soft, nontender, obese  Extremities: trace peripheral edema.  Neurologic: Awake, alert, following commands  Skin: No lesions  Access: Right IJ temp HD catheter 8/4    Basic Metabolic Panel: Recent Labs  Lab 03/07/19 2104 03/08/19 0444 03/08/19 2349 03/09/19 0436 03/10/19 0511 03/11/19 0459 03/12/19 0348 03/13/19 1003 03/14/19 0554  NA 137 134* 137 139 136 135 134* 132* 134*  K 4.1 4.1 3.9 4.1 3.4* 3.5 3.2* 2.9* 3.4*  CL 98 97* 98 99 99 98 99 99 97*  CO2 24 23 24 25 24 24 22  21* 25  GLUCOSE 244* 346* 265* 284* 307* 325* 304* 244* 201*  BUN 73* 90* 72* 77* 102* 112* 112* 125* 121*  CREATININE 3.62* 4.23* 3.55* 3.98* 3.99* 3.64* 3.46* 3.52* 3.32*  CALCIUM 7.8* 7.6* 7.6* 7.6* 7.6* 7.8* 7.7* 7.4* 7.7*  MG 2.2 2.3 2.2  --  2.6* 2.9*  --   --  2.9*  PHOS 4.3 5.5*  --  6.2* 7.6* 7.5*  --   --   --     Liver Function Tests: Recent Labs  Lab 03/08/19 0444 03/09/19 0436 03/10/19 0511  AST  44*  --  39  ALT 25  --  27  ALKPHOS 133*  --  118  BILITOT 1.0  --  0.9  PROT 5.5*  --  5.4*  ALBUMIN 1.7* 1.6* 1.6*   No results for input(s): LIPASE, AMYLASE in the last 168 hours. No results for input(s): AMMONIA in the last 168 hours.  CBC: Recent Labs  Lab 03/10/19 0511 03/11/19 0459 03/12/19 0348 03/13/19 1003 03/14/19 0554  WBC 33.2* 36.2* 31.4* 31.7* 25.3*  NEUTROABS  --   --  28.8*  --   --   HGB 10.7* 10.8* 10.0* 10.7* 10.4*  HCT 33.2* 33.8* 31.2* 33.0* 32.9*  MCV 84.5 86.0 85.7 83.8 85.7  PLT 316 307 218 213 214    Cardiac Enzymes: No results for input(s): CKTOTAL, CKMB, CKMBINDEX, TROPONINI in the last 168 hours.  BNP: Invalid input(s): POCBNP  CBG: Recent Labs  Lab 03/13/19 1210 03/13/19 1719 03/13/19 2111 03/14/19 0743 03/14/19 1147  GLUCAP 223* 206* 250* 182* 185*    Microbiology: Results for orders placed or performed during the hospital encounter of 03/04/19  Urine culture     Status: Abnormal   Collection Time: 03/04/19  2:14 PM   Specimen: Urine, Clean Catch  Result Value Ref Range Status   Specimen Description   Final  URINE, CLEAN CATCH Performed at [facility], [address], [place], [place] [address]    Special Requests   Final    NONE Performed at [facility], [address] [place] [address] [place], [place] [address]    Culture (A)  Final    <10,000 COLONIES/mL INSIGNIFICANT GROWTH Performed at [facility] [address] [place], [place] [address]    Report Status 03/06/2019 FINAL  Final  SARS Coronavirus 2 [facility] order, Performed in [facility] hospital lab) Nasopharyngeal Nasopharyngeal Swab     Status: None   Collection Time: 03/04/19  2:17 PM   Specimen: Nasopharyngeal Swab  Result Value Ref Range Status   SARS Coronavirus 2 NEGATIVE NEGATIVE Final    Comment: (NOTE) If result is NEGATIVE SARS-CoV-2 target nucleic acids are NOT DETECTED. The SARS-CoV-2 RNA is generally detectable in upper and  lower  respiratory specimens during the acute phase of infection. The lowest  concentration of SARS-CoV-2 viral copies this assay can detect is 250  copies / mL. A negative result does not preclude SARS-CoV-2 infection  and should not be used as the sole basis for treatment or other  patient management decisions.  A negative result may occur with  improper specimen collection / handling, submission of specimen other  than nasopharyngeal swab, presence of viral mutation(s) within the  areas targeted by this assay, and inadequate number of viral copies  (<250 copies / mL). A negative result must be combined with clinical  observations, patient history, and epidemiological information. If result is POSITIVE SARS-CoV-2 target nucleic acids are DETECTED. The SARS-CoV-2 RNA is generally detectable in upper and lower  respiratory specimens dur ing the acute phase of infection.  Positive  results are indicative of active infection with SARS-CoV-2.  Clinical  correlation with patient history and other diagnostic information is  necessary to determine patient infection status.  Positive results do  not rule out bacterial infection or co-infection with other viruses. If result is PRESUMPTIVE POSTIVE SARS-CoV-2 nucleic acids MAY BE PRESENT.   A presumptive positive result was obtained on the submitted specimen  and confirmed on repeat testing.  While 2019 novel coronavirus  (SARS-CoV-2) nucleic acids may be present in the submitted sample  additional confirmatory testing may be necessary for epidemiological  and / or clinical management purposes  to differentiate between  SARS-CoV-2 and other Sarbecovirus currently known to infect humans.  If clinically indicated additional testing with an alternate test  methodology [id number]) is advised. The SARS-CoV-2 RNA is generally  detectable in upper and lower respiratory sp ecimens during the acute  phase of infection. The expected result is  Negative. Fact Sheet for Patients:  StrictlyIdeas.no Fact Sheet for Healthcare Providers: BankingDealers.co.za This test is not yet approved or cleared by the [place] FDA and has been authorized for detection and/or diagnosis of SARS-CoV-2 by FDA under an Emergency Use Authorization (EUA).  This EUA will remain in effect (meaning this test can be used) for the duration of the COVID-19 declaration under Section 564(b)(1) of the Act, 21 U.S.C. section 360bbb-3(b)(1), unless the authorization is terminated or revoked sooner. Performed at [facility], [address] [place], [place] [address]   Blood Culture (routine x 2)     Status: None   Collection Time: 03/04/19  2:55 PM   Specimen: BLOOD  Result Value Ref Range Status   Specimen Description BLOOD BLOOD RIGHT HAND  Final   Special Requests   Final    BOTTLES DRAWN  AEROBIC AND ANAEROBIC Blood Culture adequate volume   Culture   Final    NO GROWTH 5 DAYS Performed at [facility], [address] [place], [place] [address]    Report Status 03/09/2019 FINAL  Final  MRSA PCR Screening     Status: None   Collection Time: 03/04/19  8:36 PM   Specimen: Nasopharyngeal  Result Value Ref Range Status   MRSA by PCR NEGATIVE NEGATIVE Final    Comment:        The GeneXpert MRSA Assay (FDA approved for NASAL specimens only), is one component of a comprehensive MRSA colonization surveillance program. It is not intended to diagnose MRSA infection nor to guide or monitor treatment for MRSA infections. Performed at [facility], [address] [place], [place] [address]   Blood Culture (routine x 2)     Status: None   Collection Time: 03/04/19 10:01 PM   Specimen: BLOOD  Result Value Ref Range Status   Specimen Description BLOOD RIGHT ASSIST CONTROL  Final   Special Requests   Final    BOTTLES DRAWN AEROBIC AND ANAEROBIC Blood Culture adequate volume    Culture   Final    NO GROWTH 5 DAYS Performed at [facility], [address], [place], [place] [address]    Report Status 03/09/2019 FINAL  Final  Body fluid culture     Status: None   Collection Time: 03/05/19 11:21 AM   Specimen: [facility] Cytology Pleural fluid  Result Value Ref Range Status   Specimen Description   Final    PLEURAL Performed at [facility], [address] [place], [place] [address]    Special Requests   Final    NONE Performed at [facility], [address], [place], [place] [address]    Gram Stain   Final    FEW WBC PRESENT, PREDOMINANTLY PMN ABUNDANT GRAM NEGATIVE RODS ABUNDANT GRAM POSITIVE COCCI FEW GRAM VARIABLE ROD    Culture   Final    MODERATE VIRIDANS STREPTOCOCCUS MODERATE PREVOTELLA BUCCAE FEW PREVOTELLA INTERMEDIA BETA LACTAMASE POSITIVE Performed at [facility], [place] [address] [place],  [address]    Report Status 03/11/2019 FINAL  Final    Coagulation Studies: No results for input(s): LABPROT, INR in the last 72 hours.  Urinalysis: Recent Labs    03/13/19 0215  COLORURINE AMBER*  LABSPEC 1.019  PHURINE 5.0  GLUCOSEU 50*  HGBUR LARGE*  BILIRUBINUR NEGATIVE  KETONESUR NEGATIVE  PROTEINUR 30*  NITRITE NEGATIVE  LEUKOCYTESUR TRACE*      Imaging: No results found.   Medications:   . sodium chloride Stopped (03/06/19 1411)  . ampicillin-sulbactam (UNASYN) IV 3 g (03/14/19 0857)   . aspirin EC  81 mg Oral Daily  . atorvastatin  80 mg Oral Daily  . budesonide (PULMICORT) nebulizer solution  0.25 mg Nebulization BID  . Chlorhexidine Gluconate Cloth  6 each Topical Q0600  . feeding supplement (NEPRO CARB STEADY)  237 mL Oral BID BM  . heparin  5,000 Units Subcutaneous Q8H  . insulin aspart  0-9 Units Subcutaneous TID AC & HS  . insulin aspart  3 Units Subcutaneous TID WC  . insulin detemir  13 Units Subcutaneous Daily  . ipratropium-albuterol  3 mL Nebulization TID  .  lactobacillus  1 g Oral TID WC  . midodrine  10 mg Oral TID WC  . niacin  1,000 mg Oral QHS  . pantoprazole  40 mg Oral Daily  sodium chloride, acetaminophen **OR** acetaminophen, albuterol, morphine injection, nitroGLYCERIN, ondansetron **OR** ondansetron (ZOFRAN) IV, polyethylene glycol  Assessment/ Plan:  [name] is a 68 y.o. white male with diabetes mellitus type II, hypertension, obstructive sleep apnea, congestive heart failure, atrial fibrillation, hyperlipidemia, Crohn's disease, coronary artery disease, GERD, pacemaker, morbid obesity,  , who was admitted to [facility] on acute exacerbation of COPD, acute exacerbation of congestive heart failure, right pleural effusion, acute respiratory failure requiring BIPAP, sepsis with pneumonia and acute renal failure requiring hemodialysis   1. Acute renal failure: baseline creatinine of 1.1, normal GFR on 01/02/2019.  wiith underlying diabetic nephropathy with glucosuria.  -Acute renal failure secondary to ATN from Sepsis -Patient continues to have good urine output of 1.6 L.  Creatinine down slightly to 3.3.  Okay to remove temporary dialysis catheter and we will advise nursing to do so.  2. Acute respiratory failure:   Secondary to pneumonia, congestive heart failure and COPD exacerbation Chest tube placed via CT guidance on 03/06/2019 -Continues to breathe comfortably.  Chest tube remains in place.  Continue treatment with Unasyn.  3. Acute exacerbation of systolic congestive heart failure:  7/10 echocardiogram 35-40% No sign of fluid overload at the moment.   4. Sepsis with hypotension: Continue midodrine.   LOS: 10 Amonte Brookover 8/13/202011:54 AM

## 2019-03-14 NOTE — Telephone Encounter (Signed)
-----   Message from Laverle Hobby, MD sent at 03/14/2019 11:59 AM EDT ----- Regarding: hfu Pt needs hfu for empyema/pneumonia in 2-4 weeks. Any provider but preferably Dr. Patsey Berthold.

## 2019-03-14 NOTE — Telephone Encounter (Signed)
Attempted to call pt to schedule 2-4 wk hospital FU per Dr.Ram request. No answer at number listed and mailbox is full. Will try again later.

## 2019-03-14 NOTE — Progress Notes (Signed)
Physical Therapy Treatment Patient Details Name: Colton Lamb MRN: [id number] DOB: Dec 15, 1950 Today's Date: 03/14/2019    History of Present Illness Patient admitted for sepsis with complicated hospital stay requiring temp HD via R IJ temp cath. Pt has R chest tube with improving R side empyema. Pt has been weaned off BP drip with vitals WNL this date.    PT Comments    Co treatment with OT. Pt agreeable to PT; denies pain. Pt demonstrate good active range of motion/low level strengthening in supine/sit position. Requires Mod A of 2 for supine to sit and Max A of 2 to return to supine and additional 3rd to assist. STS attempts x 4 from elevated surface. Pt demonstrates min stand with each attempt with difficulty activating quads/gluts. Minimal clearance from bed with Max A x 2. Pt fatigues quickly with HR increasing at third attempt into 130's with slow recovery. Final attempt due to attempt to remove drainage stained linens; pt then becomes incontinent of bowel. HR returning toward baseline with supine rest. Pt returned to bed with total assist for hygiene and changed linens. Continue PT to progress strength and endurance to improve all functional mobility.   Follow Up Recommendations  CIR     Equipment Recommendations       Recommendations for Other Services       Precautions / Restrictions Precautions Precautions: Fall(chest tube R side) Restrictions Weight Bearing Restrictions: No    Mobility  Bed Mobility Overal bed mobility: Needs Assistance Bed Mobility: Supine to Sit;Sit to Supine     Supine to sit: Mod assist;+2 for physical assistance Sit to supine: Max assist(+3 )   General bed mobility comments: Able to scoot hips and LEs to edge of bed with sequence cues; Mod 2+ A for trunk; education on use of core. Poor strength through core  Transfers Overall transfer level: Needs assistance Equipment used: Rolling walker (2 wheeled) Transfers: Sit to/from Stand Sit to  Stand: Max assist;+2 physical assistance(does not attain nearly full stand; hovers over bed)         General transfer comment: Attempted 4x; last attempt due to need for changing bedding  Ambulation/Gait                 Stairs             Wheelchair Mobility    Modified Rankin (Stroke Patients Only)       Balance Overall balance assessment: Needs assistance;History of Falls Sitting-balance support: Bilateral upper extremity supported;Feet unsupported Sitting balance-Leahy Scale: Fair     Standing balance support: Bilateral upper extremity supported Standing balance-Leahy Scale: Zero                              Cognition Arousal/Alertness: Awake/alert Behavior During Therapy: WFL for tasks assessed/performed Overall Cognitive Status: Within Functional Limits for tasks assessed                                        Exercises General Exercises - Lower Extremity Ankle Circles/Pumps: AROM;Both;10 reps Quad Sets: Strengthening;Both;10 reps Gluteal Sets: Strengthening;Both;10 reps Long Arc Quad: Strengthening;Both;10 reps Heel Slides: AROM;Both;10 reps Hip ABduction/ADduction: AAROM;Both;10 reps    General Comments        Pertinent Vitals/Pain Pain Assessment: No/denies pain    Home Living  Prior Function            PT Goals (current goals can now be found in the care plan section) Progress towards PT goals: Progressing toward goals    Frequency    7X/week      PT Plan Current plan remains appropriate    Co-evaluation   Reason for Co-Treatment: Complexity of the patient's impairments (multi-system involvement);For patient/therapist safety   OT goals addressed during session: ADL's and self-care      AM-PAC PT "6 Clicks" Mobility   Outcome Measure  Help needed turning from your back to your side while in a flat bed without using bedrails?: A Lot Help needed moving from  lying on your back to sitting on the side of a flat bed without using bedrails?: A Lot Help needed moving to and from a bed to a chair (including a wheelchair)?: Total Help needed standing up from a chair using your arms (e.g., wheelchair or bedside chair)?: Total Help needed to walk in hospital room?: Total Help needed climbing 3-5 steps with a railing? : Total 6 Click Score: 8    End of Session Equipment Utilized During Treatment: Gait belt Activity Tolerance: Patient limited by fatigue Patient left: in bed;with call bell/phone within reach;with bed alarm set Nurse Communication: Other (comment)(need for assistance due to incontinent of bowel) PT Visit Diagnosis: Muscle weakness (generalized) (M62.81);History of falling (Z91.81);Difficulty in walking, not elsewhere classified (R26.2)     Time: 2229-7989 PT Time Calculation (min) (ACUTE ONLY): 37 min  Charges:  $Therapeutic Exercise: 8-22 mins $Therapeutic Activity: 8-22 mins                      Larae Grooms, PTA 03/14/2019, 2:47 PM

## 2019-03-14 NOTE — Progress Notes (Signed)
Name: Colton Lamb MRN: [id number] DOB: May 24, 1951     CONSULTATION DATE: 03/04/2019  CHIEF COMPLAINT:  Shortness of breath   HISTORY OF PRESENT ILLNESS:   68 y.o male with PMH of Systolic HF with EF 09% (4/70/96), HTN, PAF (on xarelto), sleep apnea, ischemic cardiomyopathy, CAD, obesity, HLD, GERD presented to ED via EMS on CPAP with AMS and several days of worsening SOB. In ED he was placed on BiPAP and code sepsis activated due to Leukocytosis, lactic acidosis, and hypotension. IV abx initiated. CXR showed pulmonary edema, ED gave IVF and lasix judiciously as patient was found to have AKI.  Today the patient appears to be doing well, he has no new complaints.  Oxygen saturation is 96% on room air.  I do not see a significant drainage from the right-sided chest tube, though the 24-hour output has not been documented.  SIGNIFICANT EVENTS: 8/3 >CT head: Chronic ventriculomegaly without evidence of focal brain insult. No visible change since February 2018. This is felt most likely secondary to central atrophy. Normal pressure hydrocephalus is not excluded but not favored. 8/3 >Admitted to ICU requiring Bipap  8/3 > Levophed initiated for hypotension 8/4> Thoracentesis: 200 mL hazy yellow fluid sent to lab 8/4> Trialysis RIJ placed 8/4 > 1st HD treatment- no UF 8/5> 2nd HD treatment - no UF 8/5> CT placed to drain right pleural empyema 8/6> TPA instilled in CT 8/7> 3rd HD treatment - no UF 8/7> 2nd round of TPA instilled in CT 8/8> CT Head- parenchymal volume loss greater than expected for age. No acute abnormalities 8/8> CXR 8/10> Levophed D/C 8/10> Chest CT showing improvement in right sided empyema  8/11> tPA/DNase instilled in CT  CULTURES: 8/3 > Blood & Urine cultures- pending, no growth 8/7 8/3 > COVID 19 - negative 8/3 > MRSA PCR- negative 8/3 > Procalcitonin - 27.61 ~ 31.81 ~ 27.22 8/4> Pleural fluid- LD: 5,463 & WBC: 17,693/ gram stain positive for abundant  GNR & GPC, MODERATE VIRIDANS STREPTOCOCCUS  MODERATE PREVOTELLA BUCCAE  FEW PREVOTELLA INTERMEDIA  8/7> Pleural fluid cytology- moderate viridans streptococcus, holding for possible anaerobe   ANTIBIOTICS: 8/3 > Cefepime, d/c 8/3 > Metronidazole x 1 dose, d/c'd 8/3 8/3 > Vancomycin x 2 doses, d/c'd 8/3 8/8 > Unasyn started    Review of Systems:  Constitutional: Feels well. Cardiovascular: Denies chest pain, exertional chest pain.  Pulmonary: Denies hemoptysis, pleuritic chest pain.   The remainder of systems were reviewed and were found to be negative other than what is documented in the HPI.   VITAL SIGNS: Temp:  [97.5 F (36.4 C)-97.7 F (36.5 C)] 97.5 F (36.4 C) (08/13 0619) Pulse Rate:  [94-113] 94 (08/13 0619) Resp:  [20] 20 (08/13 0619) BP: (106-110)/(57-75) 110/57 (08/13 0619) SpO2:  [95 %-96 %] 96 % (08/13 0619)   I/O last 3 completed shifts: In: 320.8 [IV Piggyback:320.8] Out: 3160 [Urine:1850; Chest Tube:1310] No intake/output data recorded.   SpO2: 96 % O2 Flow Rate (L/min): 4 L/min FiO2 (%): 40 %  Physical Examination:   VS: BP (!) 110/57 (BP Location: Right Arm)   Pulse 94   Temp (!) 97.5 F (36.4 C) (Oral)   Resp 20   Ht 6' (1.829 m)   Wt 129.9 kg   SpO2 96%   BMI 38.84 kg/m   General Appearance: No distress  Neuro:without focal findings, mental status, speech normal, alert and oriented HEENT: PERRLA, EOM intact Pulmonary: No wheezing, No rales  CardiovascularNormal S1,S2.  No m/r/g.  Abdomen: Benign, Soft, non-tender, No masses Renal:  No costovertebral tenderness  GU:  No performed at this time. Endoc: No evident thyromegaly, no signs of acromegaly or Cushing features Skin:   warm, no rashes, no ecchymosis  Extremities: normal, no cyanosis, clubbing.    I personally reviewed lab work that was obtained in last 24 hrs. CBC    Component Value Date/Time   WBC 25.3 (H) 03/14/2019 0554   RBC 3.84 (L) 03/14/2019 0554   HGB 10.4 (L)  03/14/2019 0554   HGB 13.9 11/22/2013 0301   HCT 32.9 (L) 03/14/2019 0554   HCT 43.3 11/22/2013 0301   PLT 214 03/14/2019 0554   PLT 350 11/22/2013 0301   MCV 85.7 03/14/2019 0554   MCV 81 11/22/2013 0301   MCH 27.1 03/14/2019 0554   MCHC 31.6 03/14/2019 0554   RDW 16.2 (H) 03/14/2019 0554   RDW 16.1 (H) 11/22/2013 0301   LYMPHSABS 0.9 03/12/2019 0348   MONOABS 0.7 03/12/2019 0348   EOSABS 0.0 03/12/2019 0348   BASOSABS 0.1 03/12/2019 0348   BMP Latest Ref Rng & Units 03/14/2019 03/13/2019 03/12/2019  Glucose 70 - 99 mg/dL 201(H) 244(H) 304(H)  BUN 8 - 23 mg/dL 121(H) 125(H) 112(H)  Creatinine 0.61 - 1.24 mg/dL 3.32(H) 3.52(H) 3.46(H)  BUN/Creat Ratio 10 - 24 - - -  Sodium 135 - 145 mmol/L 134(L) 132(L) 134(L)  Potassium 3.5 - 5.1 mmol/L 3.4(L) 2.9(L) 3.2(L)  Chloride 98 - 111 mmol/L 97(L) 99 99  CO2 22 - 32 mmol/L 25 21(L) 22  Calcium 8.9 - 10.3 mg/dL 7.7(L) 7.4(L) 7.7(L)    MEDICATIONS: I have reviewed all medications and confirmed regimen as documented   IMAGING    No results found.   Indwelling Urinary Catheter continued, requirement due to   Reason to continue Indwelling Urinary Catheter strict Intake/Output monitoring for hemodynamic instability      CT chest to 03/11/2019>> imaging personally reviewed, there continues to be a small rim of pleural effusion with pneumonic changes, however overall this appears tremendously improved compared to previous CT on 8/5.    ASSESSMENT AND PLAN SYNOPSIS  68 y.o male with improved acute hypoxic respiratory failure and septic shock secondary to empyema and CAP Strep Virdans pneumonia in the presence of acute renal failure requiring hemodialysis.   Empyema. -CT surgery consulted, okay to DC CT chest tube from respiratory standpoint. -Chest CT showing improvement  -Chest tube placed  - tPA x 2 dose  - tPA/DNase instilled  Strep viridans pneumonia. -IV abx, Unasyn - Appears to be doing better, has been weaned off of  oxygen. - Okay to DC from respiratory standpoint with another week of oral antibiotic.  Marda Stalker, M.D., F.C.C.P.  Board Certified in Internal Medicine, Pulmonary Medicine, [place], and Sleep Medicine.  [facility] Office Number: [phone]

## 2019-03-14 NOTE — Progress Notes (Signed)
Inpatient Diabetes Program Recommendations  AACE/ADA: New Consensus Statement on Inpatient Glycemic Control   Target Ranges:  Prepandial:   less than 140 mg/dL      Peak postprandial:   less than 180 mg/dL (1-2 hours)      Critically ill patients:  140 - 180 mg/dL   Results for CHAYANNE, SPEIR (MRN [id number]) as of 03/14/2019 10:52  Ref. Range 03/13/2019 08:00 03/13/2019 12:10 03/13/2019 17:19 03/13/2019 21:11 03/14/2019 07:43  Glucose-Capillary Latest Ref Range: 70 - 99 mg/dL 227 (H) 223 (H) 206 (H) 250 (H) 182 (H)   Review of Glycemic Control   Diabetes history:DM2 Outpatient Diabetes medications:Jardiance 10 mg daily, Metformin 1000 mg BID Current orders for Inpatient glycemic control:Levemir 13 units QHS, Novolog 3 units TID with meals for meal coverage, Novolog 0-9 units AC&HS  Inpatient Diabetes Program Recommendations:  Insulin-Meal Coverage: Please consider increasing meal coverage to Novolog 5 units TID with meals if patient eats at least 50% of meals.  Thanks, Barnie Alderman, RN, MSN, CDE Diabetes Coordinator Inpatient Diabetes Program [phone] (Team Pager from 8am to 5pm)

## 2019-03-14 NOTE — Progress Notes (Signed)
Initial Nutrition Assessment  DOCUMENTATION CODES:   Obesity unspecified  INTERVENTION:   Nepro Shake po BID, each supplement provides 425 kcal and 19 grams protein  NUTRITION DIAGNOSIS:   Increased nutrient needs related to catabolic illness(COPD) as evidenced by increased estimated needs.  GOAL:   Patient will meet greater than or equal to 90% of their needs  MONITOR:   PO intake, Supplement acceptance, Labs, Weight trends, Skin, I & O's  REASON FOR ASSESSMENT:   LOS    ASSESSMENT:   68 y.o. white male with diabetes mellitus type II, hypertension, obstructive sleep apnea, congestive heart failure, atrial fibrillation, hyperlipidemia, Crohn's disease, coronary artery disease, GERD, pacemaker, morbid obesity who was admitted to [facility] on acute exacerbation of COPD, acute exacerbation of congestive heart failure, right pleural effusion, acute respiratory failure requiring BIPAP, sepsis with pneumonia and acute renal failure requiring temporary hemodialysis  RD working remotely.  Pt with increased estimated needs. Per chart, pt eating 50-100% of meals in hospital. RD will add supplements to help pt meet his estimated needs. Per chart, pt with weight gain pta. Pt has remained weight stable in hospital. Chest tube with 252m output. UOP [id number] 24 hrs.   Medications reviewed and include: aspirin, heparin, insulin, lactobacillus, niacin, protonix, unasyn   Labs reviewed: Na 134(L), K 3.4(L), Cl 97(L), BUN 121(H), creat 3.32(H), Mg 2.9(H) P 7.5(H)- 8/10 Wbc- 25.3(H), Hgb 10.4(L), Hct 32.9(L) cbgs- 227, 223, 206, 250, 182 x 24 hrs AIC 6.8(H)- 8/4  Unable to complete Nutrition-Focused physical exam at this time.   Diet Order:   Diet Order            Diet heart healthy/carb modified Room service appropriate? Yes; Fluid consistency: Thin  Diet effective now             EDUCATION NEEDS:   No education needs have been identified at this time  Skin:  Skin Assessment:  Reviewed RN Assessment(MASD, R chest tube)  Last BM:  8/12  Height:   Ht Readings from Last 1 Encounters:  03/04/19 6' (1.829 m)    Weight:   Wt Readings from Last 1 Encounters:  03/06/19 129.9 kg    Ideal Body Weight:  80.9 kg  BMI:  Body mass index is 38.84 kg/m.  Estimated Nutritional Needs:   Kcal:  2400-2700kcal/day  Protein:  >130g/day  Fluid:  2L/day  CaKoleen DistanceS, RD, LDN Pager #- [phone]#- [phone] Hours Pager: [phone]

## 2019-03-14 NOTE — Progress Notes (Addendum)
RN discontinue right IJ temporary dialysis catheter per nephrology order. Also per infection prevention pt doesn't need to be in contact precautions. RN will continue to assess and monitor pt.

## 2019-03-14 NOTE — Progress Notes (Signed)
[facility] [place] NAME: Colton Lamb    MR#:  [id number]  DATE OF BIRTH:  03-22-1951  SUBJECTIVE:   Patient is more awake today and following commands, drainage from the CT has improved.  Seen by pulmonary and status post TPA/Pulmozyme through the chest tube again.  Patient denies any chest pain shortness of breath or any other associated symptoms.  REVIEW OF SYSTEMS:    Review of Systems  Constitutional: Negative for chills and fever.  HENT: Negative for congestion and tinnitus.   Eyes: Negative for blurred vision and double vision.  Respiratory: Negative for cough, shortness of breath and wheezing.   Cardiovascular: Negative for chest pain, orthopnea and PND.  Gastrointestinal: Negative for abdominal pain, diarrhea, nausea and vomiting.  Genitourinary: Negative for dysuria and hematuria.  Neurological: Positive for weakness (generalized). Negative for dizziness, sensory change and focal weakness.  All other systems reviewed and are negative.   Nutrition: Heart Healthy/Carb control Tolerating Diet: Yes Tolerating PT: Eval noted.   DRUG ALLERGIES:   Allergies  Allergen Reactions  . Iodine Other (See Comments)    Shortness of breath, swelling and hives  . Shrimp [Shellfish Allergy] Other (See Comments)    SWELLING    HIVES    SHORTNESS OF BREATH  . Tetracycline Rash    VITALS:  Blood pressure 96/65, pulse (!) 104, temperature 97.8 F (36.6 C), temperature source Oral, resp. rate 15, height 6' (1.829 m), weight 129.9 kg, SpO2 98 %.  PHYSICAL EXAMINATION:   Physical Exam  GENERAL:  68 y.o.-year-old obese patient lying in bed in no acute distress.  EYES: Pupils equal, round, reactive to light and accommodation. No scleral icterus. Extraocular muscles intact.  HEENT: Head atraumatic, normocephalic. Oropharynx and nasopharynx clear.  NECK:  Supple, no jugular venous distention. No thyroid enlargement, no tenderness.   LUNGS: Good a/e B/l, no wheezing, rales, rhonchi. No use of accessory muscles of respiration. Right sided CT in place yellow color fluid drainage noted. CARDIOVASCULAR: S1, S2 normal. No murmurs, rubs, or gallops.  ABDOMEN: Soft, nontender, nondistended. Bowel sounds present. No organomegaly or mass.  EXTREMITIES: No cyanosis, clubbing or edema b/l.    NEUROLOGIC: Cranial nerves II through XII are intact. No focal Motor or sensory deficits b/l. Globally weak.    PSYCHIATRIC: The patient is alert and oriented x 3.  SKIN: No obvious rash, lesion, or ulcer.   Right chest wall CT in place with some yellow fluid draining. Foley cath in place with yellow urine draining.     LABORATORY PANEL:   CBC Recent Labs  Lab 03/14/19 0554  WBC 25.3*  HGB 10.4*  HCT 32.9*  PLT 214   ------------------------------------------------------------------------------------------------------------------  Chemistries  Recent Labs  Lab 03/10/19 0511  03/14/19 0554  NA 136   < > 134*  K 3.4*   < > 3.4*  CL 99   < > 97*  CO2 24   < > 25  GLUCOSE 307*   < > 201*  BUN 102*   < > 121*  CREATININE 3.99*   < > 3.32*  CALCIUM 7.6*   < > 7.7*  MG 2.6*   < > 2.9*  AST 39  --   --   ALT 27  --   --   ALKPHOS 118  --   --   BILITOT 0.9  --   --    < > = values in this interval not displayed.   ------------------------------------------------------------------------------------------------------------------  Cardiac Enzymes No results for input(s): TROPONINI in the last 168 hours. ------------------------------------------------------------------------------------------------------------------  RADIOLOGY:  No results found.   ASSESSMENT AND PLAN:   68 year old male with past medical history of paroxysmal atrial fibrillation, obstructive sleep apnea, diabetes, obesity, hypertension, hyperlipidemia, GERD, history of coronary artery disease, history of chronic systolic CHF who presented to the hospital  due to worsening shortness of breath.  1.  Sepsis/septic shock- secondary to pneumonia with empyema. -Much improved and hemodynamics also much improved.  Off IV steroids, continue IV Unasyn  2.  Acute respiratory failure with hypoxia-secondary to pneumonia with empyema. -Seen by general surgery, status post chest tube placement on the right.  Body fluid cultures are consistent with strep viridans, Prevotella. -Continue IV Unasyn for now.  CT chest 8/10 showing improvement in the right-sided empyema.   - wean O2 as tolerated  3. Pneumonia/Empyema-source of patient's sepsis and hypoxemia.  Status post chest tube placement by CT surgery. . -CT chest showing significant improvement in his right-sided empyema 2 days ago.   -Seen by pulmonary and also CT surgery and as per pulmonary drainage from his chest tube has improved.  Can possibly DC chest tube in the next 1 to 2 days.  Continue TPA/Pulmozyme to improve drainage.  Continue Unasyn for now and switch to oral antibiotics in next 24 hours.  4.  Acute renal failure-patient's baseline creatinines around 1.1 and now elevated at 3.5. - 1625 cc of urine in the past 24 hrs and Seen by nephrology and no acute indication for dialysis. -This is likely ATN from underlying sepsis and hypotension.  - Follow BUN/Cr.   5.  Diabetes type 2 without complication- cont. Levemir, SSI and Novolog with meals.  - appreciate DM coordinator input.   6.  Leukocytosis- secondary to underlying sepsis/pneumonia/empyema complicated with use of steroids. - Trending down and will cont. To monitor.   7.  Hyperlipidemia-continue atorvastatin.  8.  COPD-no acute exacerbation.  Continue duo nebs, albuterol nebs.  PT evaluation noted and they are recommending continuous inpatient rehab. social work made aware.  All the records are reviewed and case discussed with Care Management/Social Worker. Management plans discussed with the patient, family and they are in agreement.   CODE STATUS: Full code  DVT Prophylaxis: Heparin subcu  TOTAL TIME TAKING CARE OF THIS PATIENT: 30 minutes.   POSSIBLE D/C unclear DAYS, DEPENDING ON CLINICAL CONDITION and progress   Henreitta Leber M.D on 03/14/2019 at 3:01 PM  Between 7am to 6pm - Pager - [phone]  After 6pm go to [email or web address] - Proofreader  [facility]  Office  [phone]  CC: Primary care physician; Toni Arthurs, NP

## 2019-03-14 NOTE — Progress Notes (Signed)
PT Cancellation Note  Patient Details Name: Colton Lamb MRN: [id number] DOB: March 16, 1951   Cancelled Treatment:    Reason Eval/Treat Not Completed: Other (comment). Nursing requests hold until after 14:00 due to recent IJ catheter removal. Re attempt as the schedule allows.    Larae Grooms, PTA 03/14/2019, 1:31 PM

## 2019-03-14 NOTE — Progress Notes (Signed)
Occupational Therapy Treatment Patient Details Name: Colton Lamb MRN: [id number] DOB: 27-Mar-1951 Today's Date: 03/14/2019    History of present illness Patient admitted for sepsis with complicated hospital stay requiring temp HD via R IJ temp cath. Pt has R chest tube with improving R side empyema. Pt has been weaned off BP drip with vitals WNL this date.   OT comments  Pt seen to review Energy conserv tech and breathing while he was supine in bed after IJ catheter was removed and then co-tx with PT for bed mobility and sitting EOB with 3 attempts at standing with FWW.  Careful placement of gait belt due to chest tube and positioned low.  He was able to complete bed mobility with mod cues and then mod assist X2 which is an improvement since yesterday. He was incontient of bowel and needed assistance to clean up after laying down in bed with max assist and cues. Pt was able to help push with LEs and pull with arms but was limited due to fatigue. Pt with SOB and HR increased to 130s and rest breaks needed but patient was very motivated to partcipate and regain functional mobility since he was independent in all ADLs and living alone.  This is a major change in functional mobility for ADLs and continue to recommend CIR.  Follow Up Recommendations  CIR    Equipment Recommendations       Recommendations for Other Services      Precautions / Restrictions Precautions Precautions: Fall(chest tube R side) Restrictions Weight Bearing Restrictions: No       Mobility Bed Mobility Overal bed mobility: Needs Assistance Bed Mobility: Supine to Sit;Sit to Supine     Supine to sit: Mod assist;+2 for physical assistance Sit to supine: Max assist(+3 )   General bed mobility comments: Able to scoot hips and LEs to edge of bed with sequence cues; Mod 2+ A for trunk; education on use of core. Poor strength through core  Transfers Overall transfer level: Needs assistance Equipment used: Rolling  walker (2 wheeled) Transfers: Sit to/from Stand Sit to Stand: Max assist;+2 physical assistance(does not attain nearly full stand; hovers over bed)         General transfer comment: Attempted 4x; last attempt due to need for changing bedding    Balance Overall balance assessment: Needs assistance;History of Falls Sitting-balance support: Bilateral upper extremity supported;Feet unsupported Sitting balance-Leahy Scale: Fair     Standing balance support: Bilateral upper extremity supported Standing balance-Leahy Scale: Zero                             ADL either performed or assessed with clinical judgement   ADL Overall ADL's : Needs assistance/impaired Eating/Feeding: Independent;Set up                   Lower Body Dressing: Maximal assistance;Set up                 General ADL Comments: Pt seen to review Energy conserv tech and breathing while he was supine in bed after IJ catheter was removed and then co-tx with PT for bed mobility and sitting EOB with 3 attempts at standing with FWW.  Careful placement of gait belt due to chest tube and positioned low.  He was able to complete bed mobility with mod cues and then mod assist X2 which is an improvement since yesterday. He was incontient of bowel and needed  assistance to clean up after laying down in bed with max assist and cues. Pt was able to help push with LEs and pull with arms but was limited due to fatigue. Pt with SOB and HR increased to 130s and rest breaks needed but patient was very motivated to partcipate and regain functional mobility since he was independent in all ADLs and living alone.  This is a major change in functional mobility for ADLs and continue to recommend CIR.     Vision Baseline Vision/History: Wears glasses;Cataracts Wears Glasses: At all times Patient Visual Report: No change from baseline     Perception     Praxis      Cognition Arousal/Alertness: Awake/alert Behavior  During Therapy: WFL for tasks assessed/performed Overall Cognitive Status: Within Functional Limits for tasks assessed                                          Exercises Exercises: General Lower Extremity;Other exercises General Exercises - Lower Extremity Ankle Circles/Pumps: AROM;Both;10 reps Quad Sets: Strengthening;Both;10 reps Gluteal Sets: Strengthening;Both;10 reps Long Arc Quad: Strengthening;Both;10 reps Heel Slides: AROM;Both;10 reps Hip ABduction/ADduction: AAROM;Both;10 reps   Shoulder Instructions       General Comments      Pertinent Vitals/ Pain       Pain Assessment: No/denies pain  Home Living                                          Prior Functioning/Environment              Frequency  Min 3X/week        Progress Toward Goals  OT Goals(current goals can now be found in the care plan section)  Progress towards OT goals: Progressing toward goals  Acute Rehab OT Goals Patient Stated Goal: to get stronger OT Goal Formulation: With patient Time For Goal Achievement: 03/26/19 Potential to Achieve Goals: Good  Plan Discharge plan remains appropriate    Co-evaluation    PT/OT/SLP Co-Evaluation/Treatment: Yes Reason for Co-Treatment: Complexity of the patient's impairments (multi-system involvement);For patient/therapist safety   OT goals addressed during session: ADL's and self-care      AM-PAC OT "6 Clicks" Daily Activity     Outcome Measure   Help from another person eating meals?: None Help from another person taking care of personal grooming?: None Help from another person toileting, which includes using toliet, bedpan, or urinal?: Total Help from another person bathing (including washing, rinsing, drying)?: A Lot Help from another person to put on and taking off regular upper body clothing?: A Little Help from another person to put on and taking off regular lower body clothing?: A Lot 6 Click  Score: 16    End of Session Equipment Utilized During Treatment: Gait belt  OT Visit Diagnosis: Other abnormalities of gait and mobility (R26.89);Muscle weakness (generalized) (M62.81);History of falling (Z91.81)   Activity Tolerance Patient tolerated treatment well   Patient Left in bed;with call bell/phone within reach;with bed alarm set   Nurse Communication          Time: 1340-1410 OT Time Calculation (min): 30 min  Charges: OT General Charges $OT Visit: 1 Visit OT Treatments $Self Care/Home Management : 23-37 mins  Chrys Racer, OTR/L, [place] ascom [phone] 03/14/19, 2:53 PM

## 2019-03-15 ENCOUNTER — Inpatient Hospital Stay: Payer: Medicare Other

## 2019-03-15 LAB — PROTEIN ELECTRO, RANDOM URINE
Albumin ELP, Urine: 11.4 %
Alpha-1-Globulin, U: 1.9 %
Alpha-2-Globulin, U: 26.9 %
Beta Globulin, U: 25.4 %
Gamma Globulin, U: 34.4 %
Total Protein, Urine: 30.2 mg/dL

## 2019-03-15 LAB — GLUCOSE, CAPILLARY
Glucose-Capillary: 156 mg/dL — ABNORMAL HIGH (ref 70–99)
Glucose-Capillary: 175 mg/dL — ABNORMAL HIGH (ref 70–99)
Glucose-Capillary: 206 mg/dL — ABNORMAL HIGH (ref 70–99)
Glucose-Capillary: 271 mg/dL — ABNORMAL HIGH (ref 70–99)

## 2019-03-15 LAB — CBC
HCT: 33 % — ABNORMAL LOW (ref 39.0–52.0)
Hemoglobin: 10.4 g/dL — ABNORMAL LOW (ref 13.0–17.0)
MCH: 27 pg (ref 26.0–34.0)
MCHC: 31.5 g/dL (ref 30.0–36.0)
MCV: 85.7 fL (ref 80.0–100.0)
Platelets: 226 10*3/uL (ref 150–400)
RBC: 3.85 MIL/uL — ABNORMAL LOW (ref 4.22–5.81)
RDW: 16.1 % — ABNORMAL HIGH (ref 11.5–15.5)
WBC: 23.5 10*3/uL — ABNORMAL HIGH (ref 4.0–10.5)
nRBC: 0 % (ref 0.0–0.2)

## 2019-03-15 LAB — GLOMERULAR BASEMENT MEMBRANE ANTIBODIES: GBM Ab: 3 units (ref 0–20)

## 2019-03-15 LAB — BASIC METABOLIC PANEL
Anion gap: 13 (ref 5–15)
BUN: 102 mg/dL — ABNORMAL HIGH (ref 8–23)
CO2: 21 mmol/L — ABNORMAL LOW (ref 22–32)
Calcium: 7.8 mg/dL — ABNORMAL LOW (ref 8.9–10.3)
Chloride: 99 mmol/L (ref 98–111)
Creatinine, Ser: 2.39 mg/dL — ABNORMAL HIGH (ref 0.61–1.24)
GFR calc Af Amer: 31 mL/min — ABNORMAL LOW (ref >60)
GFR calc non Af Amer: 27 mL/min — ABNORMAL LOW (ref >60)
Glucose, Bld: 166 mg/dL — ABNORMAL HIGH (ref 70–99)
Potassium: 3.2 mmol/L — ABNORMAL LOW (ref 3.5–5.1)
Sodium: 133 mmol/L — ABNORMAL LOW (ref 135–145)

## 2019-03-15 LAB — C3 COMPLEMENT: C3 Complement: 128 mg/dL (ref 82–167)

## 2019-03-15 LAB — C4 COMPLEMENT: Complement C4, Body Fluid: 25 mg/dL (ref 14–44)

## 2019-03-15 MED ORDER — SODIUM CHLORIDE 0.9 % IV SOLN
3.0000 g | Freq: Four times a day (QID) | INTRAVENOUS | Status: DC
  Administered 2019-03-15 – 2019-03-18 (×11): 3 g via INTRAVENOUS
  Filled 2019-03-15: qty 3
  Filled 2019-03-15 (×3): qty 8
  Filled 2019-03-15 (×8): qty 3
  Filled 2019-03-15 (×2): qty 8

## 2019-03-15 MED ORDER — POTASSIUM CHLORIDE CRYS ER 20 MEQ PO TBCR
40.0000 meq | EXTENDED_RELEASE_TABLET | Freq: Once | ORAL | Status: AC
  Administered 2019-03-15: 40 meq via ORAL
  Filled 2019-03-15: qty 2

## 2019-03-15 MED ORDER — MIDODRINE HCL 5 MG PO TABS
5.0000 mg | ORAL_TABLET | Freq: Three times a day (TID) | ORAL | Status: AC
  Administered 2019-03-15 – 2019-03-16 (×3): 5 mg via ORAL
  Filled 2019-03-15 (×3): qty 1

## 2019-03-15 MED ORDER — IPRATROPIUM-ALBUTEROL 0.5-2.5 (3) MG/3ML IN SOLN
3.0000 mL | Freq: Two times a day (BID) | RESPIRATORY_TRACT | Status: DC
  Administered 2019-03-15 – 2019-03-17 (×4): 3 mL via RESPIRATORY_TRACT
  Filled 2019-03-15 (×4): qty 3

## 2019-03-15 NOTE — Progress Notes (Signed)
Chest tube check: 240 cc output/24 hrs. Still has purulent appearance  CXR reveals RLL opacity - likely infiltrate + atx + pleural reaction. CT chest from 08/10 revealed very good drainage of pleural space  Will leave chest tube in place for now on suction and recheck in AM 08/15  Merton Border, MD PCCM service Mobile [phone] Pager [phone] 03/15/2019 3:47 PM

## 2019-03-15 NOTE — Progress Notes (Signed)
PHARMACY NOTE:  ANTIMICROBIAL RENAL DOSAGE ADJUSTMENT  Current antimicrobial regimen includes a mismatch between antimicrobial dosage and estimated renal function.  As per policy approved by the Pharmacy & Therapeutics and Medical Executive Committees, the antimicrobial dosage will be adjusted accordingly.  Current antimicrobial dosage:  Unasyn 3g q12h  Indication: Aspiration pneumonia  Renal Function:  Estimated Creatinine Clearance: 41.8 mL/min (A) (by C-G formula based on SCr of 2.39 mg/dL (H)).    Antimicrobial dosage has been changed to:  Unasyn 3g q6h  Additional comments: Renal function continues to improve (Scr 3.32 >2.39 today)  Thank you for allowing pharmacy to be a part of this patient's care.  Benita Gutter, [facility]  Pharmacy Resident 03/15/2019 2:53 PM

## 2019-03-15 NOTE — Progress Notes (Signed)
[facility]  ROUNDING NOTE   Subjective:  Awaiting additional serologic testing. Creatinine is down to 2.39 with a BUN of 102 today.  Objective:  Vital signs in last 24 hours:  Temp:  [97.7 F (36.5 C)-98.1 F (36.7 C)] 97.7 F (36.5 C) (08/14 1142) Pulse Rate:  [88-109] 93 (08/14 1142) Resp:  [15-20] 18 (08/14 1142) BP: (96-108)/(61-67) 108/61 (08/14 1142) SpO2:  [95 %-98 %] 95 % (08/14 1142)  Weight change:  Filed Weights   03/05/19 1730 03/06/19 1545 03/06/19 1549  Weight: 132.1 kg 129.9 kg 129.9 kg    Intake/Output: I/O last 3 completed shifts: In: 530.8 [P.O.:10; IV Piggyback:520.8] Out: 2185 [Urine:1925; Chest Tube:260]   Intake/Output this shift:  No intake/output data recorded.  Physical Exam: General: No acute distress  Head: Normocephalic, atraumatic. Moist oral mucosal membranes  Eyes: Anicteric  Neck: Supple   Lungs:  Bilateral rhonchi, normal effort, chest tube in place  Heart: irregular  Abdomen:  Soft, nontender, obese  Extremities: trace peripheral edema.  Neurologic: Awake, alert, following commands  Skin: No lesions  Access: Right IJ temp HD catheter 8/4    Basic Metabolic Panel: Recent Labs  Lab 03/08/19 2349 03/09/19 0436 03/10/19 0511 03/11/19 0459 03/12/19 0348 03/13/19 1003 03/14/19 0554 03/15/19 0828  NA 137 139 136 135 134* 132* 134* 133*  K 3.9 4.1 3.4* 3.5 3.2* 2.9* 3.4* 3.2*  CL 98 99 99 98 99 99 97* 99  CO2 24 25 24 24 22  21* 25 21*  GLUCOSE 265* 284* 307* 325* 304* 244* 201* 166*  BUN 72* 77* 102* 112* 112* 125* 121* 102*  CREATININE 3.55* 3.98* 3.99* 3.64* 3.46* 3.52* 3.32* 2.39*  CALCIUM 7.6* 7.6* 7.6* 7.8* 7.7* 7.4* 7.7* 7.8*  MG 2.2  --  2.6* 2.9*  --   --  2.9*  --   PHOS  --  6.2* 7.6* 7.5*  --   --   --   --     Liver Function Tests: Recent Labs  Lab 03/09/19 0436 03/10/19 0511  AST  --  39  ALT  --  27  ALKPHOS  --  118  BILITOT  --  0.9  PROT  --  5.4*  ALBUMIN 1.6* 1.6*   No results  for input(s): LIPASE, AMYLASE in the last 168 hours. No results for input(s): AMMONIA in the last 168 hours.  CBC: Recent Labs  Lab 03/11/19 0459 03/12/19 0348 03/13/19 1003 03/14/19 0554 03/15/19 0828  WBC 36.2* 31.4* 31.7* 25.3* 23.5*  NEUTROABS  --  28.8*  --   --   --   HGB 10.8* 10.0* 10.7* 10.4* 10.4*  HCT 33.8* 31.2* 33.0* 32.9* 33.0*  MCV 86.0 85.7 83.8 85.7 85.7  PLT 307 218 213 214 226    Cardiac Enzymes: No results for input(s): CKTOTAL, CKMB, CKMBINDEX, TROPONINI in the last 168 hours.  BNP: Invalid input(s): POCBNP  CBG: Recent Labs  Lab 03/14/19 1147 03/14/19 1620 03/14/19 2233 03/15/19 0744 03/15/19 1142  GLUCAP 185* 287* 238* 156* 175*    Microbiology: Results for orders placed or performed during the hospital encounter of 03/04/19  Urine culture     Status: Abnormal   Collection Time: 03/04/19  2:14 PM   Specimen: Urine, Clean Catch  Result Value Ref Range Status   Specimen Description   Final    URINE, CLEAN CATCH Performed at [facility], [address] [place], [place] [address]    Special Requests   Final  NONE Performed at [facility], [address] [place], [place] [address]    Culture (A)  Final    <10,000 COLONIES/mL INSIGNIFICANT GROWTH Performed at [facility] [address] [place], [place] [address]    Report Status 03/06/2019 FINAL  Final  SARS Coronavirus 2 [facility] order, Performed in [facility] hospital lab) Nasopharyngeal Nasopharyngeal Swab     Status: None   Collection Time: 03/04/19  2:17 PM   Specimen: Nasopharyngeal Swab  Result Value Ref Range Status   SARS Coronavirus 2 NEGATIVE NEGATIVE Final    Comment: (NOTE) If result is NEGATIVE SARS-CoV-2 target nucleic acids are NOT DETECTED. The SARS-CoV-2 RNA is generally detectable in upper and lower  respiratory specimens during the acute phase of infection. The lowest  concentration of SARS-CoV-2 viral copies this assay can  detect is 250  copies / mL. A negative result does not preclude SARS-CoV-2 infection  and should not be used as the sole basis for treatment or other  patient management decisions.  A negative result may occur with  improper specimen collection / handling, submission of specimen other  than nasopharyngeal swab, presence of viral mutation(s) within the  areas targeted by this assay, and inadequate number of viral copies  (<250 copies / mL). A negative result must be combined with clinical  observations, patient history, and epidemiological information. If result is POSITIVE SARS-CoV-2 target nucleic acids are DETECTED. The SARS-CoV-2 RNA is generally detectable in upper and lower  respiratory specimens dur ing the acute phase of infection.  Positive  results are indicative of active infection with SARS-CoV-2.  Clinical  correlation with patient history and other diagnostic information is  necessary to determine patient infection status.  Positive results do  not rule out bacterial infection or co-infection with other viruses. If result is PRESUMPTIVE POSTIVE SARS-CoV-2 nucleic acids MAY BE PRESENT.   A presumptive positive result was obtained on the submitted specimen  and confirmed on repeat testing.  While 2019 novel coronavirus  (SARS-CoV-2) nucleic acids may be present in the submitted sample  additional confirmatory testing may be necessary for epidemiological  and / or clinical management purposes  to differentiate between  SARS-CoV-2 and other Sarbecovirus currently known to infect humans.  If clinically indicated additional testing with an alternate test  methodology [phone]) is advised. The SARS-CoV-2 RNA is generally  detectable in upper and lower respiratory sp ecimens during the acute  phase of infection. The expected result is Negative. Fact Sheet for Patients:  StrictlyIdeas.no Fact Sheet for Healthcare  Providers: BankingDealers.co.za This test is not yet approved or cleared by the [place] FDA and has been authorized for detection and/or diagnosis of SARS-CoV-2 by FDA under an Emergency Use Authorization (EUA).  This EUA will remain in effect (meaning this test can be used) for the duration of the COVID-19 declaration under Section 564(b)(1) of the Act, 21 U.S.C. section 360bbb-3(b)(1), unless the authorization is terminated or revoked sooner. Performed at [facility], [address] [place], [place] [address]   Blood Culture (routine x 2)     Status: None   Collection Time: 03/04/19  2:55 PM   Specimen: BLOOD  Result Value Ref Range Status   Specimen Description BLOOD BLOOD RIGHT HAND  Final   Special Requests   Final    BOTTLES DRAWN AEROBIC AND ANAEROBIC Blood Culture adequate volume   Culture   Final    NO GROWTH 5 DAYS Performed at [facility], [address] [place], [place] [address]    Report Status 03/09/2019 FINAL  Final  MRSA PCR Screening     Status: None   Collection Time: 03/04/19  8:36 PM   Specimen: Nasopharyngeal  Result Value Ref Range Status   MRSA by PCR NEGATIVE NEGATIVE Final    Comment:        The GeneXpert MRSA Assay (FDA approved for NASAL specimens only), is one component of a comprehensive MRSA colonization surveillance program. It is not intended to diagnose MRSA infection nor to guide or monitor treatment for MRSA infections. Performed at [facility], [address] [place], [place] [address]   Blood Culture (routine x 2)     Status: None   Collection Time: 03/04/19 10:01 PM   Specimen: BLOOD  Result Value Ref Range Status   Specimen Description BLOOD RIGHT ASSIST CONTROL  Final   Special Requests   Final    BOTTLES DRAWN AEROBIC AND ANAEROBIC Blood Culture adequate volume   Culture   Final    NO GROWTH 5 DAYS Performed at [facility], [address],  [place], [place] [address]    Report Status 03/09/2019 FINAL  Final  Body fluid culture     Status: None   Collection Time: 03/05/19 11:21 AM   Specimen: [facility] Cytology Pleural fluid  Result Value Ref Range Status   Specimen Description   Final    PLEURAL Performed at [facility], [address], [place], [place] [address]    Special Requests   Final    NONE Performed at [facility], [address], [place], [place] [address]    Gram Stain   Final    FEW WBC PRESENT, PREDOMINANTLY PMN ABUNDANT GRAM NEGATIVE RODS ABUNDANT GRAM POSITIVE COCCI FEW GRAM VARIABLE ROD    Culture   Final    MODERATE VIRIDANS STREPTOCOCCUS MODERATE PREVOTELLA BUCCAE FEW PREVOTELLA INTERMEDIA BETA LACTAMASE POSITIVE Performed at [facility], [place] [address], [place], [place] [address]    Report Status 03/11/2019 FINAL  Final    Coagulation Studies: No results for input(s): LABPROT, INR in the last 72 hours.  Urinalysis: Recent Labs    03/13/19 0215  COLORURINE AMBER*  LABSPEC 1.019  PHURINE 5.0  GLUCOSEU 50*  HGBUR LARGE*  BILIRUBINUR NEGATIVE  KETONESUR NEGATIVE  PROTEINUR 30*  NITRITE NEGATIVE  LEUKOCYTESUR TRACE*      Imaging: No results found.   Medications:   . sodium chloride Stopped (03/06/19 1411)  . ampicillin-sulbactam (UNASYN) IV 3 g (03/15/19 0916)   . aspirin EC  81 mg Oral Daily  . atorvastatin  80 mg Oral Daily  . budesonide (PULMICORT) nebulizer solution  0.25 mg Nebulization BID  . Chlorhexidine Gluconate Cloth  6 each Topical Q0600  . feeding supplement (NEPRO CARB STEADY)  237 mL Oral BID BM  . heparin  5,000 Units Subcutaneous Q8H  . insulin aspart  0-9 Units Subcutaneous TID AC & HS  . insulin aspart  5 Units Subcutaneous TID WC  . insulin detemir  13 Units Subcutaneous Daily  . ipratropium-albuterol  3 mL Nebulization BID  . lactobacillus  1 g Oral TID WC  . midodrine  10 mg Oral TID WC  . niacin  1,000 mg Oral QHS  .  pantoprazole  40 mg Oral Daily   sodium chloride, acetaminophen **OR** acetaminophen, albuterol, morphine injection, nitroGLYCERIN, ondansetron **OR** ondansetron (ZOFRAN) IV, polyethylene glycol  Assessment/ Plan:  [name] is a  68 y.o. white male with diabetes mellitus type II, hypertension, obstructive sleep apnea, congestive heart failure, atrial fibrillation, hyperlipidemia, Crohn's disease, coronary artery disease, GERD, pacemaker, morbid obesity,  , who was admitted to [facility] on acute exacerbation of COPD, acute exacerbation of congestive heart failure, right pleural effusion, acute respiratory failure requiring BIPAP, sepsis with pneumonia and acute renal failure requiring hemodialysis   1. Acute renal failure: baseline creatinine of 1.1, normal GFR on 01/02/2019.  wiith underlying diabetic nephropathy with glucosuria.  -Acute renal failure secondary to ATN from Sepsis -Additional serologic work-up has been ordered.  BUN currently 102 and creatinine now down to 2.39.  Encouraged the patient to drink plenty of fluid.  He verbalized understanding of this.  2. Acute respiratory failure:   Secondary to pneumonia, congestive heart failure and COPD exacerbation Chest tube placed via CT guidance on 03/06/2019 -Respiratory status remains stable.  Breathing comfortably.  Chest tube still in place.  3. Acute exacerbation of systolic congestive heart failure:  7/10 echocardiogram 35-40% No sign of fluid overload at the moment.   4. Sepsis with hypotension: Continue midodrine.   LOS: 11 Soyla Bainter 8/14/202011:46 AM

## 2019-03-15 NOTE — Telephone Encounter (Signed)
ATC pt re hospital f/u. Voicemail is full unable to leave message. Will try again at a later time.

## 2019-03-15 NOTE — Progress Notes (Signed)
Rehab Admissions Coordinator Note:  Note pt continuing to require max +2 for bed mobility and attempts at transfers.  Unable to come to standing.  With slow progress, feel he would require longer term rehab and be more appropriate for SNF level therapies at d/c.  No consult needed.   Colton Lamb 03/15/2019, 1:57 PM  I can be reached at [phone].

## 2019-03-15 NOTE — Progress Notes (Signed)
[place] at [place] NAME: Colton Lamb    MR#:  [id number]  DATE OF BIRTH:  1951/01/12  SUBJECTIVE:   No acute events overnight, no chest pain or worsening shortness of breath.  Drainage from the chest tube is still purulent but has slowed down.  REVIEW OF SYSTEMS:    Review of Systems  Constitutional: Negative for chills and fever.  HENT: Negative for congestion and tinnitus.   Eyes: Negative for blurred vision and double vision.  Respiratory: Negative for cough, shortness of breath and wheezing.   Cardiovascular: Negative for chest pain, orthopnea and PND.  Gastrointestinal: Negative for abdominal pain, diarrhea, nausea and vomiting.  Genitourinary: Negative for dysuria and hematuria.  Neurological: Positive for weakness (generalized). Negative for dizziness, sensory change and focal weakness.  All other systems reviewed and are negative.   Nutrition: Heart Healthy/Carb control Tolerating Diet: Yes Tolerating PT: Eval noted.   DRUG ALLERGIES:   Allergies  Allergen Reactions  . Iodine Other (See Comments)    Shortness of breath, swelling and hives  . Shrimp [Shellfish Allergy] Other (See Comments)    SWELLING    HIVES    SHORTNESS OF BREATH  . Tetracycline Rash    VITALS:  Blood pressure 108/61, pulse 93, temperature 97.7 F (36.5 C), temperature source Oral, resp. rate 18, height 6' (1.829 m), weight 129.9 kg, SpO2 95 %.  PHYSICAL EXAMINATION:   Physical Exam  GENERAL:  68 y.o.-year-old obese patient lying in bed in no acute distress.  EYES: Pupils equal, round, reactive to light and accommodation. No scleral icterus. Extraocular muscles intact.  HEENT: Head atraumatic, normocephalic. Oropharynx and nasopharynx clear.  NECK:  Supple, no jugular venous distention. No thyroid enlargement, no tenderness.  LUNGS: Good a/e B/l, no wheezing, rales, rhonchi. No use of accessory muscles of respiration. Right sided CT in  place with purulent fluid drainage noted. CARDIOVASCULAR: S1, S2 normal. No murmurs, rubs, or gallops.  ABDOMEN: Soft, nontender, nondistended. Bowel sounds present. No organomegaly or mass.  EXTREMITIES: No cyanosis, clubbing or edema b/l.    NEUROLOGIC: Cranial nerves II through XII are intact. No focal Motor or sensory deficits b/l. Globally weak.    PSYCHIATRIC: The patient is alert and oriented x 3.  SKIN: No obvious rash, lesion, or ulcer.   Right chest wall CT in place with some purulent fluid draining. Foley cath in place with yellow urine draining.     LABORATORY PANEL:   CBC Recent Labs  Lab 03/15/19 0828  WBC 23.5*  HGB 10.4*  HCT 33.0*  PLT 226   ------------------------------------------------------------------------------------------------------------------  Chemistries  Recent Labs  Lab 03/10/19 0511  03/14/19 0554 03/15/19 0828  NA 136   < > 134* 133*  K 3.4*   < > 3.4* 3.2*  CL 99   < > 97* 99  CO2 24   < > 25 21*  GLUCOSE 307*   < > 201* 166*  BUN 102*   < > 121* 102*  CREATININE 3.99*   < > 3.32* 2.39*  CALCIUM 7.6*   < > 7.7* 7.8*  MG 2.6*   < > 2.9*  --   AST 39  --   --   --   ALT 27  --   --   --   ALKPHOS 118  --   --   --   BILITOT 0.9  --   --   --    < > =  values in this interval not displayed.   ------------------------------------------------------------------------------------------------------------------  Cardiac Enzymes No results for input(s): TROPONINI in the last 168 hours. ------------------------------------------------------------------------------------------------------------------  RADIOLOGY:  Dg Chest Port 1 View  Result Date: 03/15/2019 CLINICAL DATA:  Sepsis EXAM: PORTABLE CHEST 1 VIEW COMPARISON:  03/09/2019 FINDINGS: Previously seen temporary dialysis catheter has been removed in the interval. Defibrillator and right-sided chest tube are again identified and stable. Poor inspiratory effort is noted with mild vascular  congestion. Small pleural effusions are noted bilaterally. IMPRESSION: Small effusions.  Mild vascular congestion is noted. Electronically Signed   By: Inez Catalina M.D.   On: 03/15/2019 12:47     ASSESSMENT AND PLAN:   68 year old male with past medical history of paroxysmal atrial fibrillation, obstructive sleep apnea, diabetes, obesity, hypertension, hyperlipidemia, GERD, history of coronary artery disease, history of chronic systolic CHF who presented to the hospital due to worsening shortness of breath.  1.  Sepsis/septic shock- secondary to pneumonia with empyema. -Much improved and hemodynamics also much improved.  Off IV steroids, continue IV Unasyn  2.  Acute respiratory failure with hypoxia-secondary to pneumonia with empyema. -Seen by general surgery, status post chest tube placement on the right.  Body fluid cultures are consistent with strep viridans, Prevotella. -Continue IV Unasyn for now.  CT chest 8/10 showing improvement in the right-sided empyema.   - wean O2 as tolerated  3. Pneumonia/Empyema-source of patient's sepsis and hypoxemia.  Status post chest tube placement by CT surgery. . -CT chest showing significant improvement in his right-sided empyema 3 days ago.   -Seen by pulmonary and also CT surgery status post TPA/Pulmozyme x3.  Drainage from the chest tube has improved.  Still had over 240 cc of drainage over the past 24 hours.  Plan to keep chest tube still for the next few days, consider repeating chest x-ray in the next 1 to 2 days. -Further care as per pulmonary/CT surgery.  Continue IV Unasyn for now.  4.  Acute renal failure-patient's baseline creatinines around 1.1 and improving. Cr. Down to 2.3.  - 950 cc of urine in the past 24 hrs and Seen by nephrology and no acute indication for dialysis. -This is likely ATN from underlying sepsis and hypotension. Serologic work for Acute Renal failure done but results are pending.  - Follow BUN/Cr/Urine output.   5.   Diabetes type 2 without complication- cont. Levemir, SSI and Novolog with meals.  - appreciate DM coordinator input.   6.  Leukocytosis- secondary to underlying sepsis/pneumonia/empyema complicated with use of steroids. - Trending down and will cont. To monitor.   7.  Hyperlipidemia-continue atorvastatin.  8.  COPD-no acute exacerbation.  Continue duo nebs, albuterol nebs.  PT initially recommended CIR but reeval suggests SNF and social work aware.   All the records are reviewed and case discussed with Care Management/Social Worker. Management plans discussed with the patient, family and they are in agreement.  CODE STATUS: Full code  DVT Prophylaxis: Heparin subcu  TOTAL TIME TAKING CARE OF THIS PATIENT: 30 minutes.   POSSIBLE D/C unclear DAYS, DEPENDING ON CLINICAL CONDITION and progress   Henreitta Leber M.D on 03/15/2019 at 2:34 PM  Between 7am to 6pm - Pager - [phone]  After 6pm go to [email or web address] - Proofreader  [facility]  Office  [phone]  CC: Primary care physician; Toni Arthurs, NP

## 2019-03-15 NOTE — Progress Notes (Signed)
Physical Therapy Treatment Patient Details Name: Colton Lamb MRN: [id number] DOB: 10/06/1950 Today's Date: 03/15/2019    History of Present Illness Patient admitted for sepsis with complicated hospital stay requiring temp HD via R IJ temp cath. Pt has R chest tube with improving R side empyema. Pt has been weaned off BP drip with vitals WNL this date.    PT Comments    Pt in bed, feeling better, ready to try.  To edge of bed with mod a x 1 and verbal cues for hand placements.  Once sitting he sits with supervision for safety.  Attempted to stand x 3 with max a x 2 without gait belt due to chest tube.  He is unable to stand fully but puts in excellent effort to try.  He is able to lateral scoot to left in sitting to move up in bed prior to laying down.  Max a x 2 to transition to supine.  HEP review.  Pt inc of BM and rolling with min a x 1 for care.  Tech remained in room for bathing.    Follow Up Recommendations  CIR     Equipment Recommendations  Rolling walker with 5" wheels    Recommendations for Other Services       Precautions / Restrictions Precautions Precautions: Fall Restrictions Weight Bearing Restrictions: No Other Position/Activity Restrictions: chest tube    Mobility  Bed Mobility Overal bed mobility: Needs Assistance Bed Mobility: Supine to Sit;Sit to Supine     Supine to sit: Mod assist Sit to supine: Max assist;+2 for physical assistance   General bed mobility comments: overall improved mobility this session but still requires +2 assist  Transfers Overall transfer level: Needs assistance Equipment used: Rolling walker (2 wheeled) Transfers: Sit to/from Stand Sit to Stand: Max assist;+2 physical assistance         General transfer comment: attempted x 3 but unable to clear hips fully off bed.  Ambulation/Gait             General Gait Details: not able to perform   Stairs             Wheelchair Mobility    Modified Rankin  (Stroke Patients Only)       Balance Overall balance assessment: Needs assistance;History of Falls Sitting-balance support: Bilateral upper extremity supported;Feet unsupported Sitting balance-Leahy Scale: Fair     Standing balance support: Bilateral upper extremity supported Standing balance-Leahy Scale: Zero                              Cognition Arousal/Alertness: Awake/alert Behavior During Therapy: WFL for tasks assessed/performed Overall Cognitive Status: Within Functional Limits for tasks assessed                                        Exercises Other Exercises Other Exercises: rolling for care due to inc BM noted while standing.  reviewed HEP in supine and encouraged ex's to tolerance during the day    General Comments        Pertinent Vitals/Pain Pain Assessment: No/denies pain    Home Living                      Prior Function            PT Goals (current goals can now be  found in the care plan section) Progress towards PT goals: Progressing toward goals    Frequency           PT Plan Current plan remains appropriate    Co-evaluation              AM-PAC PT "6 Clicks" Mobility   Outcome Measure  Help needed turning from your back to your side while in a flat bed without using bedrails?: A Lot Help needed moving from lying on your back to sitting on the side of a flat bed without using bedrails?: A Lot Help needed moving to and from a bed to a chair (including a wheelchair)?: Total Help needed standing up from a chair using your arms (e.g., wheelchair or bedside chair)?: Total Help needed to walk in hospital room?: Total Help needed climbing 3-5 steps with a railing? : Total 6 Click Score: 8    End of Session Equipment Utilized During Treatment: Gait belt Activity Tolerance: Patient tolerated treatment well Patient left: in bed;with call bell/phone within reach;with bed alarm set;with nursing/sitter  in room         Time: 4888-9169 PT Time Calculation (min) (ACUTE ONLY): 14 min  Charges:  $Therapeutic Activity: 8-22 mins                    Chesley Noon, PTA 03/15/19, 11:19 AM

## 2019-03-15 NOTE — Progress Notes (Signed)
Occupational Therapy Treatment Patient Details Name: Colton Lamb MRN: [id number] DOB: [birth year] Today's Date: 03/15/2019    History of present illness Patient admitted for sepsis with complicated hospital stay requiring temp HD via R IJ temp cath. Pt has R chest tube with improving R side empyema. Pt has been weaned off BP drip with vitals WNL this date.   OT comments  Mr. Mcclaren was seen for OT treatment on this date. Upon arrival to room pt awake/alert semi-supine in bed. Pt eager to participate in OT tx. This therapist positioned pt bed into chair mode to engage in UE there-ex described below. Pt demonstrated good overall technique and participation. Required minor cues for technique, particularly with shoulder there-ex as well was frequent reminders to engage in PLB and pacing strategies to support activity tolerance and endurance during exertional activity. Pt verbalized understanding of education provided. After UE there-ex pt initially agreeable to attempt bed-level ADL tasks. After needing +2 total assist to reposition in bed, pt stated he was having a BM and would like to be cleaned up prior to further OT tx on this date. NT notified and session ended at that time. Pt making good progress toward goals and continues to benefit from skilled OT services to maximize return to PLOF and minimize risk of future falls, injury, caregiver burden, and readmission. Will continue to follow POC. Discharge recommendation remains appropriate.     Follow Up Recommendations  CIR    Equipment Recommendations  Other (comment)(TBD at next venue of care.)    Recommendations for Other Services      Precautions / Restrictions Precautions Precautions: Fall Restrictions Weight Bearing Restrictions: No Other Position/Activity Restrictions: chest tube       Mobility Bed Mobility Overal bed mobility: Needs Assistance Bed Mobility: Supine to Sit;Sit to Supine     Supine to sit: Mod assist Sit to  supine: Max assist;+2 for physical assistance   General bed mobility comments: Pt attempted to come to long sitting in bed from semi-reclined position on this date. Required mod assist to support shoulder lift-off from bed. Pt fatigued quickly and was noted to have HR increase into the 110's after exertion. Further mobility deferred. Pt completed UE ther-ex with bed in chair mode on this date which he tolerated well. +2 total assist for bed boost on this date.  Transfers Overall transfer level: Needs assistance Equipment used: Rolling walker (2 wheeled) Transfers: Sit to/from Stand Sit to Stand: Max assist;+2 physical assistance         General transfer comment: attempted x 3 but unable to clear hips fully off bed.    Balance Overall balance assessment: Needs assistance;History of Falls Sitting-balance support: Bilateral upper extremity supported;Feet unsupported Sitting balance-Leahy Scale: Fair     Standing balance support: Bilateral upper extremity supported Standing balance-Leahy Scale: Zero                             ADL either performed or assessed with clinical judgement   ADL Overall ADL's : Needs assistance/impaired                                             Vision Baseline Vision/History: Wears glasses;Cataracts Wears Glasses: At all times Patient Visual Report: No change from baseline     Perception     Praxis  Cognition Arousal/Alertness: Awake/alert Behavior During Therapy: WFL for tasks assessed/performed Overall Cognitive Status: Within Functional Limits for tasks assessed                                          Exercises General Exercises - Upper Extremity Shoulder Flexion: AROM;Both;10 reps(With bed in chair position.) Shoulder Horizontal ABduction: Both;AROM;5 reps Shoulder Horizontal ADduction: AROM;Both;5 reps Elbow Flexion: AROM;10 reps;Both Elbow Extension: AROM;10 reps;Both Wrist  Flexion: AROM;Both;10 reps Wrist Extension: AROM;Both;10 reps Other Exercises Other Exercises: Pt provided with reinforcement on importance of taking rest breaks during physical activity as an ECS as well as use of PLB when he begins to feel SOB. Pt return demonstrated.   Shoulder Instructions       General Comments Chest tube in place at start/end of session.    Pertinent Vitals/ Pain       Pain Assessment: No/denies pain  Home Living                                          Prior Functioning/Environment              Frequency  Min 3X/week        Progress Toward Goals  OT Goals(current goals can now be found in the care plan section)  Progress towards OT goals: Progressing toward goals  Acute Rehab OT Goals Patient Stated Goal: to get stronger OT Goal Formulation: With patient Time For Goal Achievement: 03/26/19 Potential to Achieve Goals: Good  Plan Discharge plan remains appropriate    Co-evaluation                 AM-PAC OT "6 Clicks" Daily Activity     Outcome Measure   Help from another person eating meals?: None Help from another person taking care of personal grooming?: None Help from another person toileting, which includes using toliet, bedpan, or urinal?: Total Help from another person bathing (including washing, rinsing, drying)?: A Lot Help from another person to put on and taking off regular upper body clothing?: A Little Help from another person to put on and taking off regular lower body clothing?: A Lot 6 Click Score: 16    End of Session Equipment Utilized During Treatment: Gait belt  OT Visit Diagnosis: Other abnormalities of gait and mobility (R26.89);Muscle weakness (generalized) (M62.81);History of falling (Z91.81)   Activity Tolerance Patient tolerated treatment well   Patient Left in bed;with call bell/phone within reach;with bed alarm set   Nurse Communication Other (comment)(Pt had BM in bed at end of  session.)        Time: 3552-1747 OT Time Calculation (min): 19 min  Charges: OT General Charges $OT Visit: 1 Visit OT Treatments $Therapeutic Exercise: 8-22 mins  Shara Blazing, M.S., OTR/L Ascom: [phone] 03/15/19, 3:12 PM

## 2019-03-15 NOTE — Care Management Important Message (Signed)
Important Message  Patient Details  Name: JERICK KHACHATRYAN MRN: [id number] Date of Birth: 1951-04-22   Medicare Important Message Given:  Yes     Dannette Barbara 03/15/2019, 11:14 AM

## 2019-03-15 NOTE — TOC Progression Note (Signed)
Transition of Care [facility]) - Progression Note    Patient Details  Name: Colton Lamb MRN: [id number] Date of Birth: [birth year]  Transition of Care [facility]) CM/SW [place], LCSW Phone Number: 03/15/2019, 9:31 AM  Clinical Narrative: CSW met with patient to discuss PT recommendations. Discussed potential barriers to CIR placement. He is agreeable to SNF if he cannot go to CIR and said [facility] would probably be his top preference. Provided CMS scores for facilities within 25 miles of his zip code. Will send out referral once chest tube is out.    Expected Discharge Plan: Home/Self Care Barriers to Discharge: Continued Medical Work up  Expected Discharge Plan and Services Expected Discharge Plan: Home/Self Care       Living arrangements for the past 2 months: Single Family Home                                       Social Determinants of Health (SDOH) Interventions    Readmission Risk Interventions Readmission Risk Prevention Plan 03/11/2019  Transportation Screening Complete  Social Work Consult for [place] Planning/Counseling [place] Not Applicable  Medication Review [employer]) Complete  Some recent data might be hidden

## 2019-03-16 LAB — CBC
HCT: 32 % — ABNORMAL LOW (ref 39.0–52.0)
Hemoglobin: 10.1 g/dL — ABNORMAL LOW (ref 13.0–17.0)
MCH: 27.4 pg (ref 26.0–34.0)
MCHC: 31.6 g/dL (ref 30.0–36.0)
MCV: 87 fL (ref 80.0–100.0)
Platelets: 228 10*3/uL (ref 150–400)
RBC: 3.68 MIL/uL — ABNORMAL LOW (ref 4.22–5.81)
RDW: 16.1 % — ABNORMAL HIGH (ref 11.5–15.5)
WBC: 24.2 10*3/uL — ABNORMAL HIGH (ref 4.0–10.5)
nRBC: 0 % (ref 0.0–0.2)

## 2019-03-16 LAB — GASTROINTESTINAL PANEL BY PCR, STOOL (REPLACES STOOL CULTURE)

## 2019-03-16 LAB — BASIC METABOLIC PANEL
Anion gap: 10 (ref 5–15)
BUN: 89 mg/dL — ABNORMAL HIGH (ref 8–23)
CO2: 24 mmol/L (ref 22–32)
Calcium: 7.7 mg/dL — ABNORMAL LOW (ref 8.9–10.3)
Chloride: 103 mmol/L (ref 98–111)
Creatinine, Ser: 1.98 mg/dL — ABNORMAL HIGH (ref 0.61–1.24)
GFR calc Af Amer: 39 mL/min — ABNORMAL LOW (ref >60)
GFR calc non Af Amer: 34 mL/min — ABNORMAL LOW (ref >60)
Glucose, Bld: 185 mg/dL — ABNORMAL HIGH (ref 70–99)
Potassium: 3.3 mmol/L — ABNORMAL LOW (ref 3.5–5.1)
Sodium: 137 mmol/L (ref 135–145)

## 2019-03-16 LAB — C DIFFICILE QUICK SCREEN W PCR REFLEX
C Diff antigen: NEGATIVE
C Diff interpretation: NOT DETECTED
C Diff toxin: NEGATIVE

## 2019-03-16 LAB — GLUCOSE, CAPILLARY
Glucose-Capillary: 159 mg/dL — ABNORMAL HIGH (ref 70–99)
Glucose-Capillary: 179 mg/dL — ABNORMAL HIGH (ref 70–99)
Glucose-Capillary: 157 mg/dL — ABNORMAL HIGH (ref 70–99)
Glucose-Capillary: 212 mg/dL — ABNORMAL HIGH (ref 70–99)

## 2019-03-16 MED ORDER — LOPERAMIDE HCL 2 MG PO CAPS
2.0000 mg | ORAL_CAPSULE | ORAL | Status: DC | PRN
  Administered 2019-03-17: 08:00:00 2 mg via ORAL
  Filled 2019-03-16 (×2): qty 1

## 2019-03-16 NOTE — Progress Notes (Signed)
Patient ID: Colton Lamb, male   DOB: 03-Jul-1951, 68 y.o.   MRN: [id number]  [employer] PROGRESS NOTE  Colton Lamb:[id number] DOB: 11/13/1950 DOA: 03/04/2019 PCP: Toni Arthurs, NP  HPI/Subjective: Patient feels okay.  Offers no complaints.  Patient does feel weak.  Objective: Vitals:   03/16/19 0746 03/16/19 1140  BP:  103/64  Pulse: 100 93  Resp: 18   Temp:  97.7 F (36.5 C)  SpO2: 96% (!) 80%    Filed Weights   03/05/19 1730 03/06/19 1545 03/06/19 1549  Weight: 132.1 kg 129.9 kg 129.9 kg    ROS: Review of Systems  Constitutional: Positive for malaise/fatigue. Negative for chills and fever.  Eyes: Negative for blurred vision.  Respiratory: Negative for cough, shortness of breath and wheezing.   Cardiovascular: Negative for chest pain.  Gastrointestinal: Negative for abdominal pain, constipation, diarrhea, nausea and vomiting.  Genitourinary: Negative for dysuria.  Musculoskeletal: Negative for joint pain.  Neurological: Negative for dizziness and headaches.   Exam: Physical Exam  Constitutional: He is oriented to person, place, and time.  HENT:  Nose: No mucosal edema.  Mouth/Throat: No oropharyngeal exudate or posterior oropharyngeal edema.  Eyes: Pupils are equal, round, and reactive to light. Conjunctivae, EOM and lids are normal.  Neck: No JVD present. Carotid bruit is not present. No edema present. No thyroid mass and no thyromegaly present.  Cardiovascular: S1 normal and S2 normal. Exam reveals no gallop.  No murmur heard. Pulses:      Dorsalis pedis pulses are 2+ on the right side and 2+ on the left side.  Respiratory: No respiratory distress. He has decreased breath sounds in the right lower field and the left lower field. He has no wheezes. He has no rhonchi. He has no rales.  GI: Soft. Bowel sounds are normal. There is no abdominal tenderness.  Musculoskeletal:     Right ankle: He exhibits swelling.     Left ankle: He exhibits swelling.   Lymphadenopathy:    He has no cervical adenopathy.  Neurological: He is alert and oriented to person, place, and time. No cranial nerve deficit.  Skin: Skin is warm. Nails show no clubbing.  Buttock large area of purplish discoloration with 2 areas that look like small skin ulcerations bilaterally.  I would grade this stage II sacral decubiti  Psychiatric: He has a normal mood and affect.      Data Reviewed: Basic Metabolic Panel: Recent Labs  Lab 03/10/19 0511 03/11/19 0459 03/12/19 0348 03/13/19 1003 03/14/19 0554 03/15/19 0828 03/16/19 0526  NA 136 135 134* 132* 134* 133* 137  K 3.4* 3.5 3.2* 2.9* 3.4* 3.2* 3.3*  CL 99 98 99 99 97* 99 103  CO2 24 24 22  21* 25 21* 24  GLUCOSE 307* 325* 304* 244* 201* 166* 185*  BUN 102* 112* 112* 125* 121* 102* 89*  CREATININE 3.99* 3.64* 3.46* 3.52* 3.32* 2.39* 1.98*  CALCIUM 7.6* 7.8* 7.7* 7.4* 7.7* 7.8* 7.7*  MG 2.6* 2.9*  --   --  2.9*  --   --   PHOS 7.6* 7.5*  --   --   --   --   --    Liver Function Tests: Recent Labs  Lab 03/10/19 0511  AST 39  ALT 27  ALKPHOS 118  BILITOT 0.9  PROT 5.4*  ALBUMIN 1.6*   CBC: Recent Labs  Lab 03/12/19 0348 03/13/19 1003 03/14/19 0554 03/15/19 0828 03/16/19 0526  WBC 31.4* 31.7* 25.3* 23.5* 24.2*  NEUTROABS  28.8*  --   --   --   --   HGB 10.0* 10.7* 10.4* 10.4* 10.1*  HCT 31.2* 33.0* 32.9* 33.0* 32.0*  MCV 85.7 83.8 85.7 85.7 87.0  PLT 218 213 214 226 228  BNP (last 3 results) Recent Labs    03/04/19 1414  BNP 972.0*    CBG: Recent Labs  Lab 03/15/19 1142 03/15/19 1640 03/15/19 2048 03/16/19 0754 03/16/19 1139  GLUCAP 175* 206* 271* 159* 179*     Studies: Dg Chest Port 1 View  Result Date: 03/15/2019 CLINICAL DATA:  Sepsis EXAM: PORTABLE CHEST 1 VIEW COMPARISON:  03/09/2019 FINDINGS: Previously seen temporary dialysis catheter has been removed in the interval. Defibrillator and right-sided chest tube are again identified and stable. Poor inspiratory effort is  noted with mild vascular congestion. Small pleural effusions are noted bilaterally. IMPRESSION: Small effusions.  Mild vascular congestion is noted. Electronically Signed   By: Inez Catalina M.D.   On: 03/15/2019 12:47    Scheduled Meds: . aspirin EC  81 mg Oral Daily  . atorvastatin  80 mg Oral Daily  . budesonide (PULMICORT) nebulizer solution  0.25 mg Nebulization BID  . Chlorhexidine Gluconate Cloth  6 each Topical Q0600  . feeding supplement (NEPRO CARB STEADY)  237 mL Oral BID BM  . heparin  5,000 Units Subcutaneous Q8H  . insulin aspart  0-9 Units Subcutaneous TID AC & HS  . insulin aspart  5 Units Subcutaneous TID WC  . insulin detemir  13 Units Subcutaneous Daily  . ipratropium-albuterol  3 mL Nebulization BID  . lactobacillus  1 g Oral TID WC  . niacin  1,000 mg Oral QHS  . pantoprazole  40 mg Oral Daily   Continuous Infusions: . sodium chloride Stopped (03/16/19 0809)  . ampicillin-sulbactam (UNASYN) IV 3 g (03/16/19 0914)    Assessment/Plan:  1. Septic shock secondary to pneumonia and empyema.  Patient has improved.  Patient is off IV steroids.  Continue IV Unasyn.  Patient had Prevotella and strep viridans growing out of the empyema culture.  With infection in the pleural space will need prolonged course of antibiotic.  Chest tube managed by pulmonary at this point. 2. Acute hypoxic respiratory failure.  In a had a pulse ox of 80% on room air.  Placed on oxygen supplementation. 3. Acute kidney injury.  Creatinine slowly improving and down to 1.98 today.  Maximum creatinine was 3.99. 4. Type 2 diabetes mellitus on low-dose detemir insulin and sliding scale 5. Hyperlipidemia on atorvastatin 6. Morbid obesity.  Weight loss needed 7. Weakness.  Physical therapy recommends CIR.  Code Status:     Code Status Orders  (From admission, onward)         Start     Ordered   03/04/19 2022  Full code  Continuous     03/04/19 2021        Code Status History    Date Active  Date Inactive Code Status Order ID Comments User Context   09/07/2016 0100 09/09/2016 2001 Full Code [id number]  Harvie Bridge, DO Inpatient   04/16/2014 1552 04/17/2014 1512 Full Code [id number]  Evans Lance, MD Inpatient   12/11/2013 2042 12/14/2013 1516 Full Code [id number]  Crista Luria Inpatient   11/22/2013 1434 11/30/2013 2000 Full Code [id number]  Belva Crome, MD Inpatient   11/22/2013 0916 11/22/2013 1434 Full Code [id number]  Dorothy Spark, MD Inpatient   11/22/2013 0829 11/22/2013 0916 Full Code [id number]  Barrett, Suanne Marker  Darnell Level PA-C Inpatient   Advance Care Planning Activity     Family Communication: Patient refused me calling family today Disposition Plan: Will need rehab  Consultants:  Pulmonary  Nephrology  Procedures:  Chest tube  Antibiotics:  Unasyn  Time spent: 28 minutes  [place]

## 2019-03-16 NOTE — Progress Notes (Signed)
[facility]  ROUNDING NOTE   Subjective:  Patient seen at bedside. Urine output was 1.7 L over the preceding 24 hours. Creatinine now down to 1.98 with a BUN of 89.  Objective:  Vital signs in last 24 hours:  Temp:  [97.7 F (36.5 C)-98.8 F (37.1 C)] 98.4 F (36.9 C) (08/15 0302) Pulse Rate:  [93-106] 100 (08/15 0746) Resp:  [18-20] 18 (08/15 0746) BP: (101-118)/(61-69) 101/69 (08/15 0302) SpO2:  [95 %-97 %] 96 % (08/15 0746)  Weight change:  Filed Weights   03/05/19 1730 03/06/19 1545 03/06/19 1549  Weight: 132.1 kg 129.9 kg 129.9 kg    Intake/Output: I/O last 3 completed shifts: In: 926.7 [P.O.:420; IV Piggyback:506.7] Out: 2460 [Urine:2250; Chest Tube:210]   Intake/Output this shift:  No intake/output data recorded.  Physical Exam: General: No acute distress  Head: Normocephalic, atraumatic. Moist oral mucosal membranes  Eyes: Anicteric  Neck: Supple   Lungs:  Bilateral rhonchi, normal effort, chest tube in place  Heart: irregular  Abdomen:  Soft, nontender, obese  Extremities: trace peripheral edema.  Neurologic: Awake, alert, following commands  Skin: No lesions  Access: Right IJ temp HD catheter 8/4    Basic Metabolic Panel: Recent Labs  Lab 03/10/19 0511 03/11/19 0459 03/12/19 0348 03/13/19 1003 03/14/19 0554 03/15/19 0828 03/16/19 0526  NA 136 135 134* 132* 134* 133* 137  K 3.4* 3.5 3.2* 2.9* 3.4* 3.2* 3.3*  CL 99 98 99 99 97* 99 103  CO2 24 24 22  21* 25 21* 24  GLUCOSE 307* 325* 304* 244* 201* 166* 185*  BUN 102* 112* 112* 125* 121* 102* 89*  CREATININE 3.99* 3.64* 3.46* 3.52* 3.32* 2.39* 1.98*  CALCIUM 7.6* 7.8* 7.7* 7.4* 7.7* 7.8* 7.7*  MG 2.6* 2.9*  --   --  2.9*  --   --   PHOS 7.6* 7.5*  --   --   --   --   --     Liver Function Tests: Recent Labs  Lab 03/10/19 0511  AST 39  ALT 27  ALKPHOS 118  BILITOT 0.9  PROT 5.4*  ALBUMIN 1.6*   No results for input(s): LIPASE, AMYLASE in the last 168 hours. No results  for input(s): AMMONIA in the last 168 hours.  CBC: Recent Labs  Lab 03/12/19 0348 03/13/19 1003 03/14/19 0554 03/15/19 0828 03/16/19 0526  WBC 31.4* 31.7* 25.3* 23.5* 24.2*  NEUTROABS 28.8*  --   --   --   --   HGB 10.0* 10.7* 10.4* 10.4* 10.1*  HCT 31.2* 33.0* 32.9* 33.0* 32.0*  MCV 85.7 83.8 85.7 85.7 87.0  PLT 218 213 214 226 228    Cardiac Enzymes: No results for input(s): CKTOTAL, CKMB, CKMBINDEX, TROPONINI in the last 168 hours.  BNP: Invalid input(s): POCBNP  CBG: Recent Labs  Lab 03/15/19 0744 03/15/19 1142 03/15/19 1640 03/15/19 2048 03/16/19 0754  GLUCAP 156* 175* 206* 271* 159*    Microbiology: Results for orders placed or performed during the hospital encounter of 03/04/19  Urine culture     Status: Abnormal   Collection Time: 03/04/19  2:14 PM   Specimen: Urine, Clean Catch  Result Value Ref Range Status   Specimen Description   Final    URINE, CLEAN CATCH Performed at [facility], [address], [place], [place] [address]    Special Requests   Final    NONE Performed at [facility], [address] [place], [place] [address]    Culture (A)  Final    <10,000 COLONIES/mL INSIGNIFICANT GROWTH Performed at [facility] [address] [place], [place] [address]    Report Status 03/06/2019 FINAL  Final  SARS Coronavirus 2 [facility] order, Performed in [facility] hospital lab) Nasopharyngeal Nasopharyngeal Swab     Status: None   Collection Time: 03/04/19  2:17 PM   Specimen: Nasopharyngeal Swab  Result Value Ref Range Status   SARS Coronavirus 2 NEGATIVE NEGATIVE Final    Comment: (NOTE) If result is NEGATIVE SARS-CoV-2 target nucleic acids are NOT DETECTED. The SARS-CoV-2 RNA is generally detectable in upper and lower  respiratory specimens during the acute phase of infection. The lowest  concentration of SARS-CoV-2 viral copies this assay can detect is 250  copies / mL. A negative result does not preclude  SARS-CoV-2 infection  and should not be used as the sole basis for treatment or other  patient management decisions.  A negative result may occur with  improper specimen collection / handling, submission of specimen other  than nasopharyngeal swab, presence of viral mutation(s) within the  areas targeted by this assay, and inadequate number of viral copies  (<250 copies / mL). A negative result must be combined with clinical  observations, patient history, and epidemiological information. If result is POSITIVE SARS-CoV-2 target nucleic acids are DETECTED. The SARS-CoV-2 RNA is generally detectable in upper and lower  respiratory specimens dur ing the acute phase of infection.  Positive  results are indicative of active infection with SARS-CoV-2.  Clinical  correlation with patient history and other diagnostic information is  necessary to determine patient infection status.  Positive results do  not rule out bacterial infection or co-infection with other viruses. If result is PRESUMPTIVE POSTIVE SARS-CoV-2 nucleic acids MAY BE PRESENT.   A presumptive positive result was obtained on the submitted specimen  and confirmed on repeat testing.  While 2019 novel coronavirus  (SARS-CoV-2) nucleic acids may be present in the submitted sample  additional confirmatory testing may be necessary for epidemiological  and / or clinical management purposes  to differentiate between  SARS-CoV-2 and other Sarbecovirus currently known to infect humans.  If clinically indicated additional testing with an alternate test  methodology [phone]) is advised. The SARS-CoV-2 RNA is generally  detectable in upper and lower respiratory sp ecimens during the acute  phase of infection. The expected result is Negative. Fact Sheet for Patients:  StrictlyIdeas.no Fact Sheet for Healthcare Providers: BankingDealers.co.za This test is not yet approved or cleared by the  [place] FDA and has been authorized for detection and/or diagnosis of SARS-CoV-2 by FDA under an Emergency Use Authorization (EUA).  This EUA will remain in effect (meaning this test can be used) for the duration of the COVID-19 declaration under Section 564(b)(1) of the Act, 21 U.S.C. section 360bbb-3(b)(1), unless the authorization is terminated or revoked sooner. Performed at [facility], [address] [place], [place] [address]   Blood Culture (routine x 2)     Status: None   Collection Time: 03/04/19  2:55 PM   Specimen: BLOOD  Result Value Ref Range Status   Specimen Description BLOOD BLOOD RIGHT HAND  Final   Special Requests   Final    BOTTLES DRAWN AEROBIC AND ANAEROBIC Blood Culture adequate volume   Culture   Final    NO GROWTH 5 DAYS Performed at [facility], [address], [place],  [address]    Report Status 03/09/2019 FINAL  Final  MRSA PCR Screening  Status: None   Collection Time: 03/04/19  8:36 PM   Specimen: Nasopharyngeal  Result Value Ref Range Status   MRSA by PCR NEGATIVE NEGATIVE Final    Comment:        The GeneXpert MRSA Assay (FDA approved for NASAL specimens only), is one component of a comprehensive MRSA colonization surveillance program. It is not intended to diagnose MRSA infection nor to guide or monitor treatment for MRSA infections. Performed at [facility], [address] [place], [place] [address]   Blood Culture (routine x 2)     Status: None   Collection Time: 03/04/19 10:01 PM   Specimen: BLOOD  Result Value Ref Range Status   Specimen Description BLOOD RIGHT ASSIST CONTROL  Final   Special Requests   Final    BOTTLES DRAWN AEROBIC AND ANAEROBIC Blood Culture adequate volume   Culture   Final    NO GROWTH 5 DAYS Performed at [facility], [address], [place], [place] [address]    Report Status 03/09/2019 FINAL  Final  Body fluid culture     Status: None    Collection Time: 03/05/19 11:21 AM   Specimen: [facility] Cytology Pleural fluid  Result Value Ref Range Status   Specimen Description   Final    PLEURAL Performed at [facility], [address] [place], [place] [address]    Special Requests   Final    NONE Performed at [facility], [address], [place], [place] [address]    Gram Stain   Final    FEW WBC PRESENT, PREDOMINANTLY PMN ABUNDANT GRAM NEGATIVE RODS ABUNDANT GRAM POSITIVE COCCI FEW GRAM VARIABLE ROD    Culture   Final    MODERATE VIRIDANS STREPTOCOCCUS MODERATE PREVOTELLA BUCCAE FEW PREVOTELLA INTERMEDIA BETA LACTAMASE POSITIVE Performed at [facility], [place] [address] [place], [place] [address]    Report Status 03/11/2019 FINAL  Final    Coagulation Studies: No results for input(s): LABPROT, INR in the last 72 hours.  Urinalysis: No results for input(s): COLORURINE, LABSPEC, PHURINE, GLUCOSEU, HGBUR, BILIRUBINUR, KETONESUR, PROTEINUR, UROBILINOGEN, NITRITE, LEUKOCYTESUR in the last 72 hours.  Invalid input(s): APPERANCEUR    Imaging: Dg Chest Port 1 View  Result Date: 03/15/2019 CLINICAL DATA:  Sepsis EXAM: PORTABLE CHEST 1 VIEW COMPARISON:  03/09/2019 FINDINGS: Previously seen temporary dialysis catheter has been removed in the interval. Defibrillator and right-sided chest tube are again identified and stable. Poor inspiratory effort is noted with mild vascular congestion. Small pleural effusions are noted bilaterally. IMPRESSION: Small effusions.  Mild vascular congestion is noted. Electronically Signed   By: Inez Catalina M.D.   On: 03/15/2019 12:47     Medications:   . sodium chloride Stopped (03/16/19 0809)  . ampicillin-sulbactam (UNASYN) IV 3 g (03/16/19 0914)   . aspirin EC  81 mg Oral Daily  . atorvastatin  80 mg Oral Daily  . budesonide (PULMICORT) nebulizer solution  0.25 mg Nebulization BID  . Chlorhexidine Gluconate Cloth  6 each Topical Q0600  . feeding supplement  (NEPRO CARB STEADY)  237 mL Oral BID BM  . heparin  5,000 Units Subcutaneous Q8H  . insulin aspart  0-9 Units Subcutaneous TID AC & HS  . insulin aspart  5 Units Subcutaneous TID WC  . insulin detemir  13 Units Subcutaneous Daily  . ipratropium-albuterol  3 mL Nebulization BID  . lactobacillus  1 g Oral TID WC  . midodrine  5 mg Oral TID WC  .  niacin  1,000 mg Oral QHS  . pantoprazole  40 mg Oral Daily   sodium chloride, acetaminophen **OR** acetaminophen, albuterol, morphine injection, nitroGLYCERIN, ondansetron **OR** ondansetron (ZOFRAN) IV, polyethylene glycol  Assessment/ Plan:  [name] is a 68 y.o. white male with diabetes mellitus type II, hypertension, obstructive sleep apnea, congestive heart failure, atrial fibrillation, hyperlipidemia, Crohn's disease, coronary artery disease, GERD, pacemaker, morbid obesity,  , who was admitted to [facility] on acute exacerbation of COPD, acute exacerbation of congestive heart failure, right pleural effusion, acute respiratory failure requiring BIPAP, sepsis with pneumonia and acute renal failure requiring hemodialysis   1. Acute renal failure: baseline creatinine of 1.1, normal GFR on 01/02/2019.  wiith underlying diabetic nephropathy with glucosuria.  -Acute renal failure secondary to ATN from Sepsis -Additional serologic work-up has been ordered.  BUN currently 102 and creatinine now down to 2.39.  Encouraged the patient to drink plenty of fluid.  He verbalized understanding of this.  2. Acute respiratory failure:   Secondary to pneumonia, congestive heart failure and COPD exacerbation Chest tube placed via CT guidance on 03/06/2019 -Patient continues to be breathing comfortably at the moment.  Chest tube remains in place.  Await further input from thoracic surgery.  3. Acute exacerbation of systolic congestive heart failure:  7/10 echocardiogram 35-40% Continues to be well compensated at this time.   4. Sepsis with hypotension:  Continue midodrine.   LOS: 12 Sathvika Ojo 8/15/202011:19 AM

## 2019-03-16 NOTE — Progress Notes (Addendum)
Physical Therapy Treatment Patient Details Name: Colton Lamb MRN: [id number] DOB: Jul 05, 1951 Today's Date: 03/16/2019    History of Present Illness Patient admitted for sepsis with complicated hospital stay requiring temp HD via R IJ temp cath. Pt has R chest tube with improving R side empyema. Pt has been weaned off BP drip with vitals WNL this date.    PT Comments    Pt sleeping upon arrival but awoke with gently voice.  Tech in for vitals.  R hand reading 80% O2 with dynamap but Finger O2 monitor on right reading 99%.  Pt with no c/o or SOB noted on room air.  Pt initially wanted to try standing but after initial attempts to sit upright, he stated he was too fatigued "I don't know if I can do this today."  Pt agreed to supine ex as below.     Follow Up Recommendations  CIR - pt would be appropriate for SNF if declined CIR     Equipment Recommendations  Rolling walker with 5" wheels    Recommendations for Other Services       Precautions / Restrictions Precautions Precautions: Fall    Mobility  Bed Mobility               General bed mobility comments: deferred - initially attempted then pt declined  Transfers                    Ambulation/Gait                 Stairs             Wheelchair Mobility    Modified Rankin (Stroke Patients Only)       Balance                                            Cognition Arousal/Alertness: Awake/alert Behavior During Therapy: WFL for tasks assessed/performed Overall Cognitive Status: Within Functional Limits for tasks assessed                                        Exercises Other Exercises Other Exercises: supine 2 x 10 AAROM for ankle pumps, heel slides, ab/add and SLR.    General Comments        Pertinent Vitals/Pain Pain Assessment: No/denies pain    Home Living                      Prior Function            PT Goals (current  goals can now be found in the care plan section) Progress towards PT goals: Progressing toward goals    Frequency    7X/week      PT Plan Current plan remains appropriate    Co-evaluation              AM-PAC PT "6 Clicks" Mobility   Outcome Measure  Help needed turning from your back to your side while in a flat bed without using bedrails?: A Lot Help needed moving from lying on your back to sitting on the side of a flat bed without using bedrails?: A Lot Help needed moving to and from a bed to a chair (including a wheelchair)?: Total Help needed standing up  from a chair using your arms (e.g., wheelchair or bedside chair)?: Total Help needed to walk in hospital room?: Total Help needed climbing 3-5 steps with a railing? : Total 6 Click Score: 8    End of Session   Activity Tolerance: Patient tolerated treatment well;Patient limited by fatigue Patient left: in bed;with call bell/phone within reach;with bed alarm set         Time: 1595-3967 PT Time Calculation (min) (ACUTE ONLY): 13 min  Charges:  $Therapeutic Exercise: 8-22 mins                     Chesley Noon, PTA 03/16/19, 12:58 PM

## 2019-03-17 ENCOUNTER — Inpatient Hospital Stay: Payer: Medicare Other

## 2019-03-17 DIAGNOSIS — Z4682 Encounter for fitting and adjustment of non-vascular catheter: Secondary | ICD-10-CM

## 2019-03-17 DIAGNOSIS — L8990 Pressure ulcer of unspecified site, unspecified stage: Secondary | ICD-10-CM | POA: Insufficient documentation

## 2019-03-17 LAB — GLUCOSE, CAPILLARY
Glucose-Capillary: 150 mg/dL — ABNORMAL HIGH (ref 70–99)
Glucose-Capillary: 181 mg/dL — ABNORMAL HIGH (ref 70–99)
Glucose-Capillary: 150 mg/dL — ABNORMAL HIGH (ref 70–99)
Glucose-Capillary: 165 mg/dL — ABNORMAL HIGH (ref 70–99)

## 2019-03-17 LAB — MPO/PR-3 (ANCA) ANTIBODIES
ANCA Proteinase 3: <3.5 U/mL (ref 0.0–3.5)
Myeloperoxidase Abs: <9 U/mL (ref 0.0–9.0)

## 2019-03-17 MED ORDER — FINASTERIDE 5 MG PO TABS
5.0000 mg | ORAL_TABLET | Freq: Every day | ORAL | Status: DC
  Administered 2019-03-17 – 2019-03-19 (×3): 5 mg via ORAL
  Filled 2019-03-17 (×3): qty 1

## 2019-03-17 MED ORDER — METOPROLOL TARTRATE 25 MG PO TABS
12.5000 mg | ORAL_TABLET | Freq: Two times a day (BID) | ORAL | Status: DC
  Administered 2019-03-17: 12.5 mg via ORAL
  Filled 2019-03-17 (×3): qty 1

## 2019-03-17 MED ORDER — LOPERAMIDE HCL 2 MG PO CAPS: 2.0000 mg | ORAL_CAPSULE | Freq: Four times a day (QID) | ORAL | Status: DC | PRN

## 2019-03-17 MED ORDER — MAGNESIUM SULFATE 2 GM/50ML IV SOLN
2.0000 g | Freq: Once | INTRAVENOUS | Status: AC
  Administered 2019-03-17: 2 g via INTRAVENOUS
  Filled 2019-03-17: qty 50

## 2019-03-17 MED ORDER — METOPROLOL TARTRATE 25 MG PO TABS: 12.5000 mg | ORAL_TABLET | Freq: Two times a day (BID) | ORAL | Status: DC

## 2019-03-17 MED ORDER — TAMSULOSIN HCL 0.4 MG PO CAPS
0.4000 mg | ORAL_CAPSULE | Freq: Every day | ORAL | Status: DC
  Administered 2019-03-17 – 2019-03-19 (×3): 0.4 mg via ORAL
  Filled 2019-03-17 (×3): qty 1

## 2019-03-17 NOTE — Progress Notes (Signed)
PT Cancellation Note  Patient Details Name: Colton Lamb MRN: [id number] DOB: 03/09/1951   Cancelled Treatment:    Reason Eval/Treat Not Completed: Fatigue/lethargy limiting ability to participate   Pt in bed, ready for a nap.  Stated chest tube was just removed and he is having frequent bowel movements causing fatigue.  Declined session at this time and requested to be seen tomorrow.   Chesley Noon 03/17/2019, 1:01 PM

## 2019-03-17 NOTE — Progress Notes (Signed)
Patient ID: Colton Lamb, male   DOB: January 24, 1951, 68 y.o.   MRN: [id number]  [employer] PROGRESS NOTE  Colton Lamb IRW:[id number] DOB: 03-Nov-1950 DOA: 03/04/2019 PCP: Toni Arthurs, NP  HPI/Subjective: Patient feeling better today.  Had diarrhea quite a bit yesterday.  And an episode of diarrhea before entering the room.  Patient breathing better.  Still very weak.  Objective: Vitals:   03/17/19 0619 03/17/19 0755  BP: 114/77   Pulse: (!) 107 (!) 105  Resp: 20 18  Temp: 97.6 F (36.4 C)   SpO2: 100% 98%    Filed Weights   03/05/19 1730 03/06/19 1545 03/06/19 1549  Weight: 132.1 kg 129.9 kg 129.9 kg    ROS: Review of Systems  Constitutional: Positive for malaise/fatigue. Negative for chills and fever.  Eyes: Negative for blurred vision.  Respiratory: Negative for cough, shortness of breath and wheezing.   Cardiovascular: Negative for chest pain.  Gastrointestinal: Positive for diarrhea. Negative for abdominal pain, constipation, nausea and vomiting.  Genitourinary: Negative for dysuria.  Musculoskeletal: Negative for joint pain.  Neurological: Negative for dizziness and headaches.   Exam: Physical Exam  Constitutional: He is oriented to person, place, and time.  HENT:  Nose: No mucosal edema.  Mouth/Throat: No oropharyngeal exudate or posterior oropharyngeal edema.  Eyes: Pupils are equal, round, and reactive to light. Conjunctivae, EOM and lids are normal.  Neck: No JVD present. Carotid bruit is not present. No edema present. No thyroid mass and no thyromegaly present.  Cardiovascular: S1 normal and S2 normal. Exam reveals no gallop.  No murmur heard. Pulses:      Dorsalis pedis pulses are 2+ on the right side and 2+ on the left side.  Respiratory: No respiratory distress. He has decreased breath sounds in the right lower field and the left lower field. He has no wheezes. He has no rhonchi. He has no rales.  GI: Soft. Bowel sounds are normal. There is no  abdominal tenderness.  Musculoskeletal:     Right ankle: He exhibits swelling.     Left ankle: He exhibits swelling.  Lymphadenopathy:    He has no cervical adenopathy.  Neurological: He is alert and oriented to person, place, and time. No cranial nerve deficit.  Skin: Skin is warm. Nails show no clubbing.  Buttock large area of purplish discoloration with 2 areas that look like small skin ulcerations bilaterally.  I would grade this stage II sacral decubiti  Psychiatric: He has a normal mood and affect.      Data Reviewed: Basic Metabolic Panel: Recent Labs  Lab 03/11/19 0459 03/12/19 0348 03/13/19 1003 03/14/19 0554 03/15/19 0828 03/16/19 0526  NA 135 134* 132* 134* 133* 137  K 3.5 3.2* 2.9* 3.4* 3.2* 3.3*  CL 98 99 99 97* 99 103  CO2 24 22 21* 25 21* 24  GLUCOSE 325* 304* 244* 201* 166* 185*  BUN 112* 112* 125* 121* 102* 89*  CREATININE 3.64* 3.46* 3.52* 3.32* 2.39* 1.98*  CALCIUM 7.8* 7.7* 7.4* 7.7* 7.8* 7.7*  MG 2.9*  --   --  2.9*  --   --   PHOS 7.5*  --   --   --   --   --    CBC: Recent Labs  Lab 03/12/19 0348 03/13/19 1003 03/14/19 0554 03/15/19 0828 03/16/19 0526  WBC 31.4* 31.7* 25.3* 23.5* 24.2*  NEUTROABS 28.8*  --   --   --   --   HGB 10.0* 10.7* 10.4* 10.4* 10.1*  HCT 31.2* 33.0* 32.9* 33.0* 32.0*  MCV 85.7 83.8 85.7 85.7 87.0  PLT 218 213 214 226 228  BNP (last 3 results) Recent Labs    03/04/19 1414  BNP 972.0*    CBG: Recent Labs  Lab 03/16/19 1139 03/16/19 1620 03/16/19 2143 03/17/19 0801 03/17/19 1155  GLUCAP 179* 157* 212* 150* 181*     Studies: Dg Chest Port 1 View  Result Date: 03/17/2019 CLINICAL DATA:  Empyema EXAM: PORTABLE CHEST 1 VIEW COMPARISON:  Two days ago FINDINGS: Mildly improved indistinct opacity at the right base that is both pleural and parenchymal based on CT from 6 days ago. The right diaphragm is now better visualized. The right chest tube is in stable position. Small adjacent pneumothorax at the lateral  right base Cardiomegaly with dual-chamber pacer/ICD leads. IMPRESSION: 1. Mildly improved aeration at the right base. 2. Small basal pneumothorax adjacent to the right chest tube. Electronically Signed   By: Monte Fantasia M.D.   On: 03/17/2019 07:53    Scheduled Meds: . aspirin EC  81 mg Oral Daily  . atorvastatin  80 mg Oral Daily  . Chlorhexidine Gluconate Cloth  6 each Topical Q0600  . feeding supplement (NEPRO CARB STEADY)  237 mL Oral BID BM  . finasteride  5 mg Oral Daily  . heparin  5,000 Units Subcutaneous Q8H  . insulin aspart  0-9 Units Subcutaneous TID AC & HS  . insulin aspart  5 Units Subcutaneous TID WC  . insulin detemir  13 Units Subcutaneous Daily  . lactobacillus  1 g Oral TID WC  . niacin  1,000 mg Oral QHS  . pantoprazole  40 mg Oral Daily  . tamsulosin  0.4 mg Oral QPC breakfast   Continuous Infusions: . sodium chloride Stopped (03/16/19 0809)  . ampicillin-sulbactam (UNASYN) IV 3 g (03/17/19 0831)    Assessment/Plan:  1. Septic shock secondary to pneumonia and empyema.  Patient has improved.  Patient is off IV steroids.  Continue IV Unasyn.  Patient had Prevotella and strep viridans growing out of the empyema culture.  With infection in the pleural space will need prolonged course of antibiotic.  Case discussed with Dr. Alva Garnet pulmonary and he remove the chest tube today. 2. Acute hypoxic respiratory failure.  Patient currently on room air 3. Acute kidney injury.  Creatinine slowly improving and down to 1.98 yesterday.  Maximum creatinine was 3.99.  Will check labs again tomorrow 4. Type 2 diabetes mellitus on low-dose detemir insulin and sliding scale 5. Hyperlipidemia on atorvastatin 6. Morbid obesity.  Weight loss needed 7. Weakness.  Physical therapy recommends CIR. 8. Diarrhea.  Stool studies are negative.  PRN Imodium. Could be antibiotic related. 9. Urinary retention and Foley catheter placed in the ICU.  Start Flomax and Proscar.  Hopefully can do a  voiding trial in a few days.  Code Status:     Code Status Orders  (From admission, onward)         Start     Ordered   03/04/19 2022  Full code  Continuous     03/04/19 2021        Code Status History    Date Active Date Inactive Code Status Order ID Comments User Context   09/07/2016 0100 09/09/2016 2001 Full Code [id number]  Harvie Bridge, DO Inpatient   04/16/2014 1552 04/17/2014 1512 Full Code [id number]  Evans Lance, MD Inpatient   12/11/2013 2042 12/14/2013 1516 Full Code [id number]  Eileen Stanford, PA-C Inpatient  11/22/2013 1434 11/30/2013 2000 Full Code [id number]  Belva Crome, MD Inpatient   11/22/2013 0916 11/22/2013 1434 Full Code [id number]  Dorothy Spark, MD Inpatient   11/22/2013 [phone] 11/22/2013 0916 Full Code [id number]  Barrett, Evelene Croon, PA-C Inpatient   Advance Care Planning Activity     Family Communication: Family at the bedside. Disposition Plan: Will need rehab  Consultants:  Pulmonary  Nephrology  Procedures:  Chest tube  Antibiotics:  Unasyn  Time spent: 27 minutes  [place]

## 2019-03-17 NOTE — Progress Notes (Signed)
[facility]  ROUNDING NOTE   Subjective:  Patient seen at bedside. Creatinine down to 2. Chest tube to be removed today.  Objective:  Vital signs in last 24 hours:  Temp:  [97.6 F (36.4 C)-99.6 F (37.6 C)] 98.3 F (36.8 C) (08/16 1330) Pulse Rate:  [99-107] 99 (08/16 1330) Resp:  [18-20] 18 (08/16 0755) BP: (113-115)/(62-80) 115/80 (08/16 1330) SpO2:  [96 %-100 %] 98 % (08/16 1330)  Weight change:  Filed Weights   03/05/19 1730 03/06/19 1545 03/06/19 1549  Weight: 132.1 kg 129.9 kg 129.9 kg    Intake/Output: I/O last 3 completed shifts: In: 657.3 [P.O.:180; IV Piggyback:477.3] Out: 6761 [Urine:1750; Chest Tube:90]   Intake/Output this shift:  Total I/O In: 240 [P.O.:240] Out: -   Physical Exam: General: No acute distress  Head: Normocephalic, atraumatic. Moist oral mucosal membranes  Eyes: Anicteric  Neck: Supple   Lungs:  Bilateral rhonchi, normal effort, chest tube in place  Heart: irregular  Abdomen:  Soft, nontender, obese  Extremities: trace peripheral edema.  Neurologic: Awake, alert, following commands  Skin: No lesions  Access: Dialysis catheter removed    Basic Metabolic Panel: Recent Labs  Lab 03/11/19 0459 03/12/19 0348 03/13/19 1003 03/14/19 0554 03/15/19 0828 03/16/19 0526  NA 135 134* 132* 134* 133* 137  K 3.5 3.2* 2.9* 3.4* 3.2* 3.3*  CL 98 99 99 97* 99 103  CO2 24 22 21* 25 21* 24  GLUCOSE 325* 304* 244* 201* 166* 185*  BUN 112* 112* 125* 121* 102* 89*  CREATININE 3.64* 3.46* 3.52* 3.32* 2.39* 1.98*  CALCIUM 7.8* 7.7* 7.4* 7.7* 7.8* 7.7*  MG 2.9*  --   --  2.9*  --   --   PHOS 7.5*  --   --   --   --   --     Liver Function Tests: No results for input(s): AST, ALT, ALKPHOS, BILITOT, PROT, ALBUMIN in the last 168 hours. No results for input(s): LIPASE, AMYLASE in the last 168 hours. No results for input(s): AMMONIA in the last 168 hours.  CBC: Recent Labs  Lab 03/12/19 0348 03/13/19 1003 03/14/19 0554  03/15/19 0828 03/16/19 0526  WBC 31.4* 31.7* 25.3* 23.5* 24.2*  NEUTROABS 28.8*  --   --   --   --   HGB 10.0* 10.7* 10.4* 10.4* 10.1*  HCT 31.2* 33.0* 32.9* 33.0* 32.0*  MCV 85.7 83.8 85.7 85.7 87.0  PLT 218 213 214 226 228    Cardiac Enzymes: No results for input(s): CKTOTAL, CKMB, CKMBINDEX, TROPONINI in the last 168 hours.  BNP: Invalid input(s): POCBNP  CBG: Recent Labs  Lab 03/16/19 1139 03/16/19 1620 03/16/19 2143 03/17/19 0801 03/17/19 1155  GLUCAP 179* 157* 212* 150* 181*    Microbiology: Results for orders placed or performed during the hospital encounter of 03/04/19  Urine culture     Status: Abnormal   Collection Time: 03/04/19  2:14 PM   Specimen: Urine, Clean Catch  Result Value Ref Range Status   Specimen Description   Final    URINE, CLEAN CATCH Performed at [facility], [address] [place], [place] [address]    Special Requests   Final    NONE Performed at [facility], [address], [place], [place] [address]    Culture (A)  Final    <10,000 COLONIES/mL INSIGNIFICANT GROWTH Performed at [facility], [place] [address], [place], [place] [address]    Report Status 03/06/2019 FINAL  Final  SARS  Coronavirus [address] [facility] order, Performed in [facility] hospital lab) Nasopharyngeal Nasopharyngeal Swab     Status: None   Collection Time: 03/04/19  2:17 PM   Specimen: Nasopharyngeal Swab  Result Value Ref Range Status   SARS Coronavirus 2 NEGATIVE NEGATIVE Final    Comment: (NOTE) If result is NEGATIVE SARS-CoV-2 target nucleic acids are NOT DETECTED. The SARS-CoV-2 RNA is generally detectable in upper and lower  respiratory specimens during the acute phase of infection. The lowest  concentration of SARS-CoV-2 viral copies this assay can detect is 250  copies / mL. A negative result does not preclude SARS-CoV-2 infection  and should not be used as the sole basis for treatment or other  patient management decisions.  A  negative result may occur with  improper specimen collection / handling, submission of specimen other  than nasopharyngeal swab, presence of viral mutation(s) within the  areas targeted by this assay, and inadequate number of viral copies  (<250 copies / mL). A negative result must be combined with clinical  observations, patient history, and epidemiological information. If result is POSITIVE SARS-CoV-2 target nucleic acids are DETECTED. The SARS-CoV-2 RNA is generally detectable in upper and lower  respiratory specimens dur ing the acute phase of infection.  Positive  results are indicative of active infection with SARS-CoV-2.  Clinical  correlation with patient history and other diagnostic information is  necessary to determine patient infection status.  Positive results do  not rule out bacterial infection or co-infection with other viruses. If result is PRESUMPTIVE POSTIVE SARS-CoV-2 nucleic acids MAY BE PRESENT.   A presumptive positive result was obtained on the submitted specimen  and confirmed on repeat testing.  While 2019 novel coronavirus  (SARS-CoV-2) nucleic acids may be present in the submitted sample  additional confirmatory testing may be necessary for epidemiological  and / or clinical management purposes  to differentiate between  SARS-CoV-2 and other Sarbecovirus currently known to infect humans.  If clinically indicated additional testing with an alternate test  methodology [phone]) is advised. The SARS-CoV-2 RNA is generally  detectable in upper and lower respiratory sp ecimens during the acute  phase of infection. The expected result is Negative. Fact Sheet for Patients:  StrictlyIdeas.no Fact Sheet for Healthcare Providers: BankingDealers.co.za This test is not yet approved or cleared by the [place] FDA and has been authorized for detection and/or diagnosis of SARS-CoV-2 by FDA under an Emergency Use  Authorization (EUA).  This EUA will remain in effect (meaning this test can be used) for the duration of the COVID-19 declaration under Section 564(b)(1) of the Act, 21 U.S.C. section 360bbb-3(b)(1), unless the authorization is terminated or revoked sooner. Performed at [facility], [address] [place], [place] [address]   Blood Culture (routine x 2)     Status: None   Collection Time: 03/04/19  2:55 PM   Specimen: BLOOD  Result Value Ref Range Status   Specimen Description BLOOD BLOOD RIGHT HAND  Final   Special Requests   Final    BOTTLES DRAWN AEROBIC AND ANAEROBIC Blood Culture adequate volume   Culture   Final    NO GROWTH 5 DAYS Performed at [facility], [address] [place], [place] [address]    Report Status 03/09/2019 FINAL  Final  MRSA PCR Screening     Status: None   Collection Time: 03/04/19  8:36 PM   Specimen: Nasopharyngeal  Result Value Ref Range Status   MRSA by PCR NEGATIVE NEGATIVE Final  Comment:        The GeneXpert MRSA Assay (FDA approved for NASAL specimens only), is one component of a comprehensive MRSA colonization surveillance program. It is not intended to diagnose MRSA infection nor to guide or monitor treatment for MRSA infections. Performed at [facility], [address] [place], [place] [address]   Blood Culture (routine x 2)     Status: None   Collection Time: 03/04/19 10:01 PM   Specimen: BLOOD  Result Value Ref Range Status   Specimen Description BLOOD RIGHT ASSIST CONTROL  Final   Special Requests   Final    BOTTLES DRAWN AEROBIC AND ANAEROBIC Blood Culture adequate volume   Culture   Final    NO GROWTH 5 DAYS Performed at [facility], [address], [place], [place] [address]    Report Status 03/09/2019 FINAL  Final  Body fluid culture     Status: None   Collection Time: 03/05/19 11:21 AM   Specimen: [facility] Cytology Pleural fluid  Result Value Ref Range Status   Specimen  Description   Final    PLEURAL Performed at [facility], [address], [place], [place] [address]    Special Requests   Final    NONE Performed at [facility], [address], [place], [place] [address]    Gram Stain   Final    FEW WBC PRESENT, PREDOMINANTLY PMN ABUNDANT GRAM NEGATIVE RODS ABUNDANT GRAM POSITIVE COCCI FEW GRAM VARIABLE ROD    Culture   Final    MODERATE VIRIDANS STREPTOCOCCUS MODERATE PREVOTELLA BUCCAE FEW PREVOTELLA INTERMEDIA BETA LACTAMASE POSITIVE Performed at [facility], [place] [address] [place], [place] [address]    Report Status 03/11/2019 FINAL  Final  Gastrointestinal Panel by PCR , Stool     Status: None   Collection Time: 03/16/19  9:47 PM   Specimen: Stool  Result Value Ref Range Status   Campylobacter species NOT DETECTED NOT DETECTED Final   Plesimonas shigelloides NOT DETECTED NOT DETECTED Final   Salmonella species NOT DETECTED NOT DETECTED Final   Yersinia enterocolitica NOT DETECTED NOT DETECTED Final   Vibrio species NOT DETECTED NOT DETECTED Final   Vibrio cholerae NOT DETECTED NOT DETECTED Final   Enteroaggregative E coli (EAEC) NOT DETECTED NOT DETECTED Final   Enteropathogenic E coli (EPEC) NOT DETECTED NOT DETECTED Final   Enterotoxigenic E coli (ETEC) NOT DETECTED NOT DETECTED Final   Shiga like toxin producing E coli (STEC) NOT DETECTED NOT DETECTED Final   Shigella/Enteroinvasive E coli (EIEC) NOT DETECTED NOT DETECTED Final   Cryptosporidium NOT DETECTED NOT DETECTED Final   Cyclospora cayetanensis NOT DETECTED NOT DETECTED Final   Entamoeba histolytica NOT DETECTED NOT DETECTED Final   Giardia lamblia NOT DETECTED NOT DETECTED Final   Adenovirus F40/41 NOT DETECTED NOT DETECTED Final   Astrovirus NOT DETECTED NOT DETECTED Final   Norovirus GI/GII NOT DETECTED NOT DETECTED Final   Rotavirus A NOT DETECTED NOT DETECTED Final   Sapovirus (I, II, IV, and V) NOT DETECTED NOT DETECTED Final     Comment: Performed at [facility], [address] [place], [place] [address]  C Difficile Quick Screen w PCR reflex     Status: None   Collection Time: 03/16/19  9:47 PM   Specimen: STOOL  Result Value Ref Range Status   C Diff antigen NEGATIVE NEGATIVE Final   C Diff toxin NEGATIVE NEGATIVE Final   C Diff interpretation No C. difficile detected.  Final    Comment: Performed at [facility], [address] [place], [place] [address]    Coagulation Studies: No results for input(s): LABPROT, INR in the last 72 hours.  Urinalysis: No results for input(s): COLORURINE, LABSPEC, PHURINE, GLUCOSEU, HGBUR, BILIRUBINUR, KETONESUR, PROTEINUR, UROBILINOGEN, NITRITE, LEUKOCYTESUR in the last 72 hours.  Invalid input(s): APPERANCEUR    Imaging: Dg Chest Port 1 View  Result Date: 03/17/2019 CLINICAL DATA:  Empyema EXAM: PORTABLE CHEST 1 VIEW COMPARISON:  Two days ago FINDINGS: Mildly improved indistinct opacity at the right base that is both pleural and parenchymal based on CT from 6 days ago. The right diaphragm is now better visualized. The right chest tube is in stable position. Small adjacent pneumothorax at the lateral right base Cardiomegaly with dual-chamber pacer/ICD leads. IMPRESSION: 1. Mildly improved aeration at the right base. 2. Small basal pneumothorax adjacent to the right chest tube. Electronically Signed   By: Monte Fantasia M.D.   On: 03/17/2019 07:53     Medications:   . sodium chloride Stopped (03/16/19 0809)  . ampicillin-sulbactam (UNASYN) IV 3 g (03/17/19 0831)   . aspirin EC  81 mg Oral Daily  . atorvastatin  80 mg Oral Daily  . Chlorhexidine Gluconate Cloth  6 each Topical Q0600  . feeding supplement (NEPRO CARB STEADY)  237 mL Oral BID BM  . finasteride  5 mg Oral Daily  . heparin  5,000 Units Subcutaneous Q8H  . insulin aspart  0-9 Units Subcutaneous TID AC & HS  . insulin aspart  5 Units Subcutaneous TID WC  . insulin detemir  13 Units  Subcutaneous Daily  . lactobacillus  1 g Oral TID WC  . niacin  1,000 mg Oral QHS  . pantoprazole  40 mg Oral Daily  . tamsulosin  0.4 mg Oral QPC breakfast   sodium chloride, acetaminophen **OR** acetaminophen, albuterol, loperamide, morphine injection, nitroGLYCERIN, ondansetron **OR** ondansetron (ZOFRAN) IV, polyethylene glycol  Assessment/ Plan:  Mr. ARRINGTON YOHE is a 68 y.o. white male with diabetes mellitus type II, hypertension, obstructive sleep apnea, congestive heart failure, atrial fibrillation, hyperlipidemia, Crohn's disease, coronary artery disease, GERD, pacemaker, morbid obesity,  , who was admitted to [facility] on acute exacerbation of COPD, acute exacerbation of congestive heart failure, right pleural effusion, acute respiratory failure requiring BIPAP, sepsis with pneumonia and acute renal failure requiring hemodialysis   1. Acute renal failure: baseline creatinine of 1.1, normal GFR on 01/02/2019.  wiith underlying diabetic nephropathy with glucosuria.  -Acute renal failure secondary to ATN from Sepsis -Renal function does appear to be improving.  Serologic work-up that is available thus far is negative.  GBM's were negative.  Complement levels normal.  Avoid nephrotoxins as possible.  2. Acute respiratory failure:   Secondary to pneumonia, congestive heart failure and COPD exacerbation Chest tube placed via CT guidance on 03/06/2019 -Chest tube to be removed today.  3. Acute exacerbation of systolic congestive heart failure:  7/10 echocardiogram 35-40% Heart failure remains well compensated.  Patient does not appear to be in volume overload at the moment.   4. Sepsis with hypotension: Continue midodrine.   LOS: 13 Colton Lamb 8/16/20202:32 PM

## 2019-03-17 NOTE — Progress Notes (Signed)
No distress.  No new complaints.  No recent fevers.  Minimal chest tube output over past 24 hours (less than 100 cc)  Vitals:   03/16/19 1934 03/16/19 2204 03/17/19 0619 03/17/19 0755  BP:  113/62 114/77   Pulse:  (!) 103 (!) 107 (!) 105  Resp:  18 20 18   Temp:  99.6 F (37.6 C) 97.6 F (36.4 C)   TempSrc:  Oral Oral   SpO2: 96% 97% 100% 98%  Weight:      Height:      Room air  Gen: NAD HEENT: NCAT, sclerae white Neck: No JVD Lungs: breath sounds full, no wheezes or other adventitious sounds Cardiovascular: RRR, no murmurs Abdomen: Soft, nontender, normal BS Ext: without clubbing, cyanosis, edema Neuro: grossly intact Skin: Limited exam, no lesions noted BMP Latest Ref Rng & Units 03/16/2019 03/15/2019 03/14/2019  Glucose 70 - 99 mg/dL 185(H) 166(H) 201(H)  BUN 8 - 23 mg/dL 89(H) 102(H) 121(H)  Creatinine 0.61 - 1.24 mg/dL 1.98(H) 2.39(H) 3.32(H)  BUN/Creat Ratio 10 - 24 - - -  Sodium 135 - 145 mmol/L 137 133(L) 134(L)  Potassium 3.5 - 5.1 mmol/L 3.3(L) 3.2(L) 3.4(L)  Chloride 98 - 111 mmol/L 103 99 97(L)  CO2 22 - 32 mmol/L 24 21(L) 25  Calcium 8.9 - 10.3 mg/dL 7.7(L) 7.8(L) 7.7(L)    CBC Latest Ref Rng & Units 03/16/2019 03/15/2019 03/14/2019  WBC 4.0 - 10.5 K/uL 24.2(H) 23.5(H) 25.3(H)  Hemoglobin 13.0 - 17.0 g/dL 10.1(L) 10.4(L) 10.4(L)  Hematocrit 39.0 - 52.0 % 32.0(L) 33.0(L) 32.9(L)  Platelets 150 - 400 K/uL 228 226 214    CXR: Improved RLL aeration  IMPRESSION: R empyema with Prevotella and viridans strep - day 13 abx  PLAN/REC: Pleural catheter removed by me Continue current antibiotics Recheck CXR in a.m. Will likely need 1 more week of antibiotics (Augmentin)  Merton Border, MD PCCM service Mobile [phone] Pager [phone] 03/17/2019 12:46 PM

## 2019-03-18 ENCOUNTER — Inpatient Hospital Stay: Payer: Medicare Other

## 2019-03-18 LAB — GLUCOSE, CAPILLARY
Glucose-Capillary: 157 mg/dL — ABNORMAL HIGH (ref 70–99)
Glucose-Capillary: 194 mg/dL — ABNORMAL HIGH (ref 70–99)
Glucose-Capillary: 138 mg/dL — ABNORMAL HIGH (ref 70–99)
Glucose-Capillary: 167 mg/dL — ABNORMAL HIGH (ref 70–99)

## 2019-03-18 LAB — URINALYSIS, COMPLETE (UACMP) WITH MICROSCOPIC
Bilirubin Urine: NEGATIVE
Glucose, UA: 50 mg/dL — AB
Ketones, ur: NEGATIVE mg/dL
Nitrite: NEGATIVE
Protein, ur: 100 mg/dL — AB
RBC / HPF: >50 RBC/hpf — ABNORMAL HIGH (ref 0–5)
Specific Gravity, Urine: 1.019 (ref 1.005–1.030)
WBC, UA: >50 WBC/hpf — ABNORMAL HIGH (ref 0–5)
pH: 6 (ref 5.0–8.0)

## 2019-03-18 LAB — CBC
HCT: 30.1 % — ABNORMAL LOW (ref 39.0–52.0)
Hemoglobin: 9.5 g/dL — ABNORMAL LOW (ref 13.0–17.0)
MCH: 27.5 pg (ref 26.0–34.0)
MCHC: 31.6 g/dL (ref 30.0–36.0)
MCV: 87.2 fL (ref 80.0–100.0)
Platelets: 202 10*3/uL (ref 150–400)
RBC: 3.45 MIL/uL — ABNORMAL LOW (ref 4.22–5.81)
RDW: 16.3 % — ABNORMAL HIGH (ref 11.5–15.5)
WBC: 20.6 10*3/uL — ABNORMAL HIGH (ref 4.0–10.5)
nRBC: 0 % (ref 0.0–0.2)

## 2019-03-18 LAB — BASIC METABOLIC PANEL
Anion gap: 18 — ABNORMAL HIGH (ref 5–15)
BUN: 60 mg/dL — ABNORMAL HIGH (ref 8–23)
CO2: 25 mmol/L (ref 22–32)
Calcium: 7.9 mg/dL — ABNORMAL LOW (ref 8.9–10.3)
Chloride: 92 mmol/L — ABNORMAL LOW (ref 98–111)
Creatinine, Ser: 1.74 mg/dL — ABNORMAL HIGH (ref 0.61–1.24)
GFR calc Af Amer: 46 mL/min — ABNORMAL LOW (ref >60)
GFR calc non Af Amer: 40 mL/min — ABNORMAL LOW (ref >60)
Glucose, Bld: 168 mg/dL — ABNORMAL HIGH (ref 70–99)
Potassium: 3.1 mmol/L — ABNORMAL LOW (ref 3.5–5.1)
Sodium: 135 mmol/L (ref 135–145)

## 2019-03-18 MED ORDER — AMIODARONE HCL 200 MG PO TABS
200.0000 mg | ORAL_TABLET | Freq: Every day | ORAL | Status: DC
  Administered 2019-03-18 – 2019-03-19 (×2): 200 mg via ORAL
  Filled 2019-03-18 (×2): qty 1

## 2019-03-18 MED ORDER — POTASSIUM CHLORIDE CRYS ER 20 MEQ PO TBCR
20.0000 meq | EXTENDED_RELEASE_TABLET | Freq: Every day | ORAL | Status: DC
  Administered 2019-03-18 – 2019-03-19 (×2): 20 meq via ORAL
  Filled 2019-03-18 (×2): qty 1

## 2019-03-18 MED ORDER — RIVAROXABAN 20 MG PO TABS
20.0000 mg | ORAL_TABLET | Freq: Every day | ORAL | Status: DC
  Administered 2019-03-18 – 2019-03-19 (×2): 20 mg via ORAL
  Filled 2019-03-18 (×3): qty 1

## 2019-03-18 MED ORDER — AMOXICILLIN-POT CLAVULANATE 875-125 MG PO TABS
1.0000 | ORAL_TABLET | Freq: Two times a day (BID) | ORAL | Status: DC
  Administered 2019-03-18 – 2019-03-19 (×3): 1 via ORAL
  Filled 2019-03-18 (×3): qty 1

## 2019-03-18 NOTE — NC FL2 (Signed)
[place] LEVEL OF CARE SCREENING TOOL     IDENTIFICATION  Patient Name: Colton Lamb Birthdate: Dec 20, 1950 Sex: male Admission Date (Current Location): 03/04/2019  [place] and [place] Number:  Engineering geologist and Address:  [facility], [address], [place], [place] [address]      Provider Number: [id number]  Attending Physician Name and Address:  Loletha Grayer, MD  Relative Name and Phone Number:       Current Level of Care: Hospital Recommended Level of Care: [place] Prior Approval Number:    Date Approved/Denied:   PASRR Number: [id number]  Discharge Plan: SNF    Current Diagnoses: Patient Active Problem List   Diagnosis Date Noted  . Pressure injury of skin 03/17/2019  . Acute renal failure (ARF) ([place])   . Empyema lung ([place])   . Shortness of breath   . Pleural effusion on right   . Sepsis ([place]) 03/04/2019  . Restless legs syndrome (RLS) 06/16/2017  . Hypertension, essential 06/16/2017  . Coronary artery disease of native artery of native heart with stable angina pectoris ([place]) 04/27/2017  . Hydrocephalus ([place]) 09/08/2016  . Dizziness and giddiness   . Elevated troponin 09/06/2016  . Type II diabetes mellitus ([place])   . Ischemic cardiomyopathy   . Hyponatremia 07/01/2015  . Diabetes mellitus type 2, uncontrolled, with complications ([place]) 27/78/2423  . 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 OSA (obstructive sleep apnea) 12/20/2013  . Morbid obesity ([place]) 12/20/2013  . AKI (acute kidney injury) - Creatinine improved at d/c 12/14/2013  . Elevated TSH - will need f/u TFTs with PCP in 3-4 weeks 12/14/2013  . Junctional bradycardia - resolved 12/14/2013  . Syncope - due to bradycardia in setting of Digoxin Toxicity 12/11/2013  . Chronic systolic heart failure ([place]) 12/06/2013  . At risk for sudden cardiac death - on LifeVest Dec 21, 2013  . Acute combined systolic and  diastolic CHF, NYHA class 3 -- 2/2 MI 2013-12-21  . Cardiomyopathy, ischemic 2013/12/21  . Atrial flutter ([place]) 11/22/2013  . NSTEMI (non-ST elevated myocardial infarction) ([place]) 11/22/2013  . Long term current use of anticoagulant 10/19/2010  . Hyperlipidemia 05/06/2010  . Edema 05/06/2010  . CAD S/P percutaneous coronary angioplasty - multiple PCIs 11/11/2008  . Atrial fibrillation ([place]) 11/11/2008  . GERD 11/11/2008  . [place] INTESTINE 11/11/2008  . COLONIC POLYPS, HX OF 11/11/2008  . SHELLFISH ALLERGY 11/11/2008    Orientation RESPIRATION BLADDER Height & Weight     Self, Time, Situation, Place  Normal Incontinent, Indwelling catheter Weight: 286 lb 6 oz (129.9 kg) Height:  6' (182.9 cm)  BEHAVIORAL SYMPTOMS/MOOD NEUROLOGICAL BOWEL NUTRITION STATUS  (None) (None) Incontinent Diet(Heart healthy/carb modified)  AMBULATORY STATUS COMMUNICATION OF NEEDS Skin   Extensive Assist Verbally Skin abrasions, Bruising, PU Stage and Appropriate Care(MASD.)   PU Stage 2 Dressing: (Right, left, lower perineum: Foam (lift to assess every shift) prn.)                   Personal Care Assistance Level of Assistance  Bathing, Dressing, Feeding Bathing Assistance: Maximum assistance Feeding assistance: Limited assistance Dressing Assistance: Maximum assistance     Functional Limitations Info  Sight, Hearing, Speech Sight Info: Adequate Hearing Info: Adequate Speech Info: Adequate    SPECIAL CARE FACTORS FREQUENCY  PT (By licensed PT), OT (By licensed OT)     PT Frequency: 5 x week OT Frequency: 5 x week  Contractures Contractures Info: Not present    Additional Factors Info  Code Status, Allergies Code Status Info: Full code Allergies Info: Iodine, Shrimp (Shellfish Allergy), Tetracycline           Current Medications (03/18/2019):  This is the current hospital active medication list Current Facility-Administered Medications   Medication Dose Route Frequency Provider Last Rate Last Dose  . 0.9 %  sodium chloride infusion   Intravenous PRN Flora Lipps, MD 10 mL/hr at 03/17/19 2322 250 mL at 03/17/19 2322  . acetaminophen (TYLENOL) tablet 650 mg  650 mg Oral Q6H PRN [name], MD   650 mg at 03/16/19 2358   Or  . acetaminophen (TYLENOL) suppository 650 mg  650 mg Rectal Q6H PRN [name], MD      . albuterol (PROVENTIL) (2.5 MG/3ML) 0.083% nebulizer solution 2.5 mg  2.5 mg Nebulization Q2H PRN [name], MD   2.5 mg at 03/06/19 0737  . amiodarone (PACERONE) tablet 200 mg  200 mg Oral Daily Wieting, Richard, MD      . amoxicillin-clavulanate (AUGMENTIN) 875-125 MG per tablet 1 tablet  1 tablet Oral Q12H Wilhelmina Mcardle, MD   1 tablet at 03/18/19 0957  . aspirin EC tablet 81 mg  81 mg Oral Daily [name], MD   81 mg at 03/18/19 0959  . atorvastatin (LIPITOR) tablet 80 mg  80 mg Oral Daily [name], MD   80 mg at 03/18/19 0957  . Chlorhexidine Gluconate Cloth 2 % PADS 6 each  6 each Topical Q0600 [name], MD   6 each at 03/18/19 [phone]  . feeding supplement (NEPRO CARB STEADY) liquid 237 mL  237 mL Oral BID BM Henreitta Leber, MD   237 mL at 03/18/19 1003  . finasteride (PROSCAR) tablet 5 mg  5 mg Oral Daily Loletha Grayer, MD   5 mg at 03/18/19 0959  . insulin aspart (novoLOG) injection 0-9 Units  0-9 Units Subcutaneous TID AC & HS Tukov-Yual, Magdalene S, NP   2 Units at 03/18/19 0959  . insulin aspart (novoLOG) injection 5 Units  5 Units Subcutaneous TID WC Henreitta Leber, MD   5 Units at 03/18/19 0959  . insulin detemir (LEVEMIR) injection 13 Units  13 Units Subcutaneous Daily Tyler Pita, MD   13 Units at 03/17/19 2317  . lactobacillus (FLORANEX/LACTINEX) granules 1 g  1 g Oral TID WC Flora Lipps, MD   1 g at 03/18/19 1028  . loperamide (IMODIUM) capsule 2 mg  2 mg Oral Q6H PRN Wieting, Richard, MD      . metoprolol tartrate (LOPRESSOR) tablet 12.5 mg  12.5 mg Oral BID Loletha Grayer, MD    12.5 mg at 03/17/19 1731  . morphine 2 MG/ML injection 1-2 mg  1-2 mg Intravenous Q3H PRN Darel Hong D, NP   2 mg at 03/17/19 1619  . niacin (SLO-NIACIN) CR tablet 1,000 mg  1,000 mg Oral QHS Charlett Nose, RPH   1,000 mg at 03/17/19 2318  . nitroGLYCERIN (NITROSTAT) SL tablet 0.4 mg  0.4 mg Sublingual Q5 min PRN [name], MD      . pantoprazole (PROTONIX) EC tablet 40 mg  40 mg Oral Daily Charlett Nose, RPH   40 mg at 03/18/19 1003  . polyethylene glycol (MIRALAX / GLYCOLAX) packet 17 g  17 g Oral Daily PRN [name], MD      . rivaroxaban Alveda Reasons) tablet 20 mg  20 mg Oral Daily Loletha Grayer, MD      .  tamsulosin (FLOMAX) capsule 0.4 mg  0.4 mg Oral QPC breakfast Loletha Grayer, MD   0.4 mg at 03/18/19 1002     Discharge Medications: Please see discharge summary for a list of discharge medications.  Relevant Imaging Results:  Relevant Lab Results:   Additional Information SS#: [id number]. Has not been on bipap since 8/7. Chest tube taken out 8/16.  Candie Chroman, LCSW

## 2019-03-18 NOTE — Telephone Encounter (Signed)
Pt is still in the hospital will try to reach pt once he is discharged.

## 2019-03-18 NOTE — Progress Notes (Signed)
Occupational Therapy Treatment Patient Details Name: Colton Lamb MRN: [id number] DOB: [birth year] Today's Date: 03/18/2019    History of present illness Patient admitted for sepsis with complicated hospital stay requiring temp HD via R IJ temp cath. Pt has R chest tube with improving R side empyema. Pt has been weaned off BP drip with vitals WNL this date.   OT comments  Pt seen for OT/PT co-tx on this date. Upon arrival to room, pt endorsing feeling much better since prior date. Endorsing no pain and agreeable to OT/PT session. Pt eager to participate t/o session and  demonstrated improved overall bed mobility, requiring min A to get EOB with HOB elevated and min VC's for technique. Once EOB pt able to maintain seated balance for >20 min. RN in room during session to assess pt dressing around former chest tube site, as PTA/OT noted increased drainage upon sup>sit t/f. RN cleared to continue with session. Pt able to complete STS t/f x3 on this date given mod A +2 and VC's for hand placement. Pt was able to support self in standing on this date using a 2WW for assistance. Pt demonstrated good overall engagement during session and was eager to make multiple attempts to sit in room chair on this date. OT provided continued education in PLB and ECS as pt was noted to hold his breath during functional mobility/exercises. Pt return demonstrated, but required consistent cueing t/o session. Pt continues to be a good candidate for CIR. He is eager to participate in therapy and has demonstrated good overall functional improvement with each consecutive OT session. Continue to recommend CIR upon acute care DC and will follow POC as written.    Follow Up Recommendations  CIR    Equipment Recommendations  Other (comment)(TBD at next venue of care.)    Recommendations for Other Services      Precautions / Restrictions Precautions Precautions: Fall Restrictions Weight Bearing Restrictions: No        Mobility Bed Mobility Overal bed mobility: Needs Assistance Bed Mobility: Supine to Sit;Sit to Supine     Supine to sit: Min assist Sit to supine: Min assist   General bed mobility comments: Pt with good attempt and safety awareness during sup<>sit on this date. Demonstrating improved overall ability to move self in bed. Also assisted by using legs during bed boost on this date.  Transfers Overall transfer level: Needs assistance Equipment used: Rolling walker (2 wheeled) Transfers: Sit to/from Stand Sit to Stand: Max assist;+2 physical assistance;Mod assist         General transfer comment: Pt was able to come to full stand on this date x3 with good clearance off bed and given min VC's for hand placement on each attempt. Unable to advance legs and c/o of his L knee "giving out" with attempts to take steps to chair. Ultimately unable to make it to chair and back to bed on this date.    Balance Overall balance assessment: Needs assistance;History of Falls Sitting-balance support: Feet unsupported;Single extremity supported Sitting balance-Leahy Scale: Good Sitting balance - Comments: Steady reaching within BOS. Tended to use 1 UE to support self. When seated. Brief instances of sitting w/o UE support.   Standing balance support: Bilateral upper extremity supported Standing balance-Leahy Scale: Fair Standing balance comment: Heavy use of UE on RW on this date with +2 CGA for safety. Pt able to come to full stand, but unable to attempt amb.  ADL either performed or assessed with clinical judgement   ADL Overall ADL's : Needs assistance/impaired Eating/Feeding: Independent;Set up;Sitting   Grooming: Sitting;Set up;Supervision/safety   Upper Body Bathing: Sitting;Set up;Minimal assistance;Moderate assistance   Lower Body Bathing: Maximal assistance;+2 for safety/equipment;+2 for physical assistance;Sit to/from stand   Upper Body Dressing :  Sitting;Set up;Independent   Lower Body Dressing: Maximal assistance;Set up;Sit to/from stand;+2 for physical assistance                 General ADL Comments: Pt continues to require cueing to utilize ECS during ADLs/functional mobility/transfers. Is now able to come EOB with min A and cues for hand placement. Continues to engage well during sessions and remains motivated t/o.     Vision Baseline Vision/History: Wears glasses;Cataracts Wears Glasses: At all times Patient Visual Report: No change from baseline     Perception     Praxis      Cognition Arousal/Alertness: Awake/alert Behavior During Therapy: WFL for tasks assessed/performed Overall Cognitive Status: Within Functional Limits for tasks assessed                                          Exercises Other Exercises Other Exercises: Pt provided with reinforcement on importance of taking rest breaks during physical activity as an ECS as well as use of PLB when he begins to feel SOB. Pt return demonstrated.   Shoulder Instructions       General Comments      Pertinent Vitals/ Pain       Pain Assessment: No/denies pain  Home Living                                          Prior Functioning/Environment              Frequency  Min 3X/week        Progress Toward Goals  OT Goals(current goals can now be found in the care plan section)  Progress towards OT goals: Progressing toward goals  Acute Rehab OT Goals Patient Stated Goal: to get stronger OT Goal Formulation: With patient Time For Goal Achievement: 03/26/19 Potential to Achieve Goals: Good  Plan Discharge plan remains appropriate;Frequency remains appropriate    Co-evaluation    PT/OT/SLP Co-Evaluation/Treatment: Yes Reason for Co-Treatment: Complexity of the patient's impairments (multi-system involvement);For patient/therapist safety;To address functional/ADL transfers PT goals addressed during  session: Mobility/safety with mobility;Strengthening/ROM OT goals addressed during session: ADL's and self-care;Proper use of Adaptive equipment and DME      AM-PAC OT "6 Clicks" Daily Activity     Outcome Measure   Help from another person eating meals?: None Help from another person taking care of personal grooming?: None Help from another person toileting, which includes using toliet, bedpan, or urinal?: Total Help from another person bathing (including washing, rinsing, drying)?: A Lot Help from another person to put on and taking off regular upper body clothing?: A Little Help from another person to put on and taking off regular lower body clothing?: A Lot 6 Click Score: 16    End of Session Equipment Utilized During Treatment: Gait belt;Rolling walker  OT Visit Diagnosis: Other abnormalities of gait and mobility (R26.89);Muscle weakness (generalized) (M62.81);History of falling (Z91.81)   Activity Tolerance     Patient Left in bed;with call  bell/phone within reach;with bed alarm set;with family/visitor present   Nurse Communication          Time: 3295-1884 OT Time Calculation (min): 40 min  Charges: OT General Charges $OT Visit: 1 Visit OT Treatments $Self Care/Home Management : 8-22 mins  Shara Blazing, M.S., OTR/L Ascom: [phone] 03/18/19, 1:01 PM

## 2019-03-18 NOTE — Progress Notes (Signed)
[facility]  ROUNDING NOTE   Subjective:   UOP 1557m.   Creatinine 1.74 (1.98)  Objective:  Vital signs in last 24 hours:  Temp:  [97.5 F (36.4 C)-98.3 F (36.8 C)] 97.5 F (36.4 C) (08/17 0628) Pulse Rate:  [94-105] 94 (08/17 1015) Resp:  [20] 20 (08/17 0628) BP: (91-127)/(52-82) 91/52 (08/17 1015) SpO2:  [97 %-98 %] 98 % (08/17 1015)  Weight change:  Filed Weights   03/05/19 1730 03/06/19 1545 03/06/19 1549  Weight: 132.1 kg 129.9 kg 129.9 kg    Intake/Output: I/O last 3 completed shifts: In: 1398.2 [P.O.:720; IV Piggyback:678.2] Out: 1500 [Urine:1500]   Intake/Output this shift:  Total I/O In: 120 [P.O.:120] Out: -   Physical Exam: General: NAD,   Head: Normocephalic, atraumatic. Moist oral mucosal membranes  Eyes: Anicteric, PERRL  Neck: Supple, trachea midline  Lungs:  Clear to auscultation  Heart: Regular rate and rhythm  Abdomen:  Soft, nontender,   Extremities: no peripheral edema.  Neurologic: Nonfocal, moving all four extremities  Skin: No lesions  Access: none    Basic Metabolic Panel: Recent Labs  Lab 03/13/19 1003 03/14/19 0554 03/15/19 0828 03/16/19 0526 03/18/19 0430  NA 132* 134* 133* 137 135  K 2.9* 3.4* 3.2* 3.3* 3.1*  CL 99 97* 99 103 92*  CO2 21* 25 21* 24 25  GLUCOSE 244* 201* 166* 185* 168*  BUN 125* 121* 102* 89* 60*  CREATININE 3.52* 3.32* 2.39* 1.98* 1.74*  CALCIUM 7.4* 7.7* 7.8* 7.7* 7.9*  MG  --  2.9*  --   --   --     Liver Function Tests: No results for input(s): AST, ALT, ALKPHOS, BILITOT, PROT, ALBUMIN in the last 168 hours. No results for input(s): LIPASE, AMYLASE in the last 168 hours. No results for input(s): AMMONIA in the last 168 hours.  CBC: Recent Labs  Lab 03/12/19 0348 03/13/19 1003 03/14/19 0554 03/15/19 0828 03/16/19 0526 03/18/19 0430  WBC 31.4* 31.7* 25.3* 23.5* 24.2* 20.6*  NEUTROABS 28.8*  --   --   --   --   --   HGB 10.0* 10.7* 10.4* 10.4* 10.1* 9.5*  HCT 31.2* 33.0*  32.9* 33.0* 32.0* 30.1*  MCV 85.7 83.8 85.7 85.7 87.0 87.2  PLT 218 213 214 226 228 202    Cardiac Enzymes: No results for input(s): CKTOTAL, CKMB, CKMBINDEX, TROPONINI in the last 168 hours.  BNP: Invalid input(s): POCBNP  CBG: Recent Labs  Lab 03/17/19 0801 03/17/19 1155 03/17/19 1640 03/17/19 2122 03/18/19 0730  GLUCAP 150* 181* 150* 165* 157*    Microbiology: Results for orders placed or performed during the hospital encounter of 03/04/19  Urine culture     Status: Abnormal   Collection Time: 03/04/19  2:14 PM   Specimen: Urine, Clean Catch  Result Value Ref Range Status   Specimen Description   Final    URINE, CLEAN CATCH [facility] [address], [place] [address]   Special Requests   Final    NONE [facility]formed at [facility] [address] [place] [address]   Culture (A)  Final    <10,000 COLONIES/mL INSIGNIFICANT GROWTH Performed at [facility] [address], [place] [address]   Report Status 03/06/2019 FINAL  Final  SARS Coronavirus 2 ([facility], [facility]) Nasopharyngeal Nasopharyngeal Swab     Status: None   Collection Time: 03/04/19  2:17 PM  Specimen: Nasopharyngeal Swab  Result Value Ref Range Status   SARS Coronavirus 2 NEGATIVE NEGATIVE Final    Comment: (NOTE) If result is NEGATIVE SARS-CoV-2 target nucleic acids are NOT DETECTED. The SARS-CoV-2 RNA is generally detectable in upper and lower  respiratory specimens during the acute phase of infection. The lowest  concentration of SARS-CoV-2 viral copies this assay can detect is 250  copies / mL. A negative result does not preclude SARS-CoV-2 infection  and should not be used as the sole basis for treatment or other  patient management decisions.  A negative result may occur with  improper specimen collection / handling, submission of specimen other  than nasopharyngeal swab, presence of  viral mutation(s) within the  areas targeted by this assay, and inadequate number of viral copies  (<250 copies / mL). A negative result must be combined with clinical  observations, patient history, and epidemiological information. If result is POSITIVE SARS-CoV-2 target nucleic acids are DETECTED. The SARS-CoV-2 RNA is generally detectable in upper and lower  respiratory specimens dur ing the acute phase of infection.  Positive  results are indicative of active infection with SARS-CoV-2.  Clinical  correlation with patient history and other diagnostic information is  necessary to determine patient infection status.  Positive results do  not rule out bacterial infection or co-infection with other viruses. If result is PRESUMPTIVE POSTIVE SARS-CoV-2 nucleic acids MAY BE PRESENT.   A presumptive positive result was obtained on the submitted specimen  and confirmed on repeat testing.  While 2019 novel coronavirus  (SARS-CoV-2) nucleic acids may be present in the submitted sample  additional confirmatory testing may be necessary for epidemiological  and / or clinical management purposes  to differentiate between  SARS-CoV-2 and other Sarbecovirus currently known to infect humans.  If clinically indicated additional testing with an alternate test  methodology [phone]) is advised. The SARS-CoV-2 RNA is generally  detectable in upper and lower respiratory sp ecimens during the acute  phase of infection. The expected result is Negative. Fact Sheet for Patients:  StrictlyIdeas.no Fact Sheet for Healthcare Providers: BankingDealers.co.za This test is not yet approved or cleared by the [place] FDA and has been authorized for detection and/or diagnosis of SARS-CoV-2 by FDA under an Emergency Use Authorization (EUA).  This EUA will remain in effect (meaning this test can be used) for the duration of the COVID-19 declaration under Section  564(b)(1) of the Act, 21 U.S.C. section 360bbb-3(b)(1), unless the authorization is terminated or revoked sooner. Performed at [facility], [address] [place], [place] [address]   Blood Culture (routine x 2)     Status: None   Collection Time: 03/04/19  2:55 PM   Specimen: BLOOD  Result Value Ref Range Status   Specimen Description BLOOD BLOOD RIGHT HAND  Final   Special Requests   Final    BOTTLES DRAWN AEROBIC AND ANAEROBIC Blood Culture adequate volume   Culture   Final    NO GROWTH 5 DAYS Performed at [facility], [address], [place], [place] [address]    Report Status 03/09/2019 FINAL  Final  MRSA PCR Screening     Status: None   Collection Time: 03/04/19  8:36 PM   Specimen: Nasopharyngeal  Result Value Ref Range Status   MRSA by PCR NEGATIVE NEGATIVE Final    Comment:        The GeneXpert MRSA Assay (FDA approved for NASAL specimens only), is one component of a comprehensive MRSA colonization  surveillance program. It is not intended to diagnose MRSA infection nor to guide or monitor treatment for MRSA infections. Performed at [facility], [address] [place], [place] [address]   Blood Culture (routine x 2)     Status: None   Collection Time: 03/04/19 10:01 PM   Specimen: BLOOD  Result Value Ref Range Status   Specimen Description BLOOD RIGHT ASSIST CONTROL  Final   Special Requests   Final    BOTTLES DRAWN AEROBIC AND ANAEROBIC Blood Culture adequate volume   Culture   Final    NO GROWTH 5 DAYS Performed at [facility], [address] [place], [place] [address]    Report Status 03/09/2019 FINAL  Final  Body fluid culture     Status: None   Collection Time: 03/05/19 11:21 AM   Specimen: [facility] Pleural fluid  Result Value Ref Range Status   Specimen Description   Final    PLEURAL Performed at [facility], [address] [place], [place] [address]    Special Requests   Final     NONE Performed at [facility], [address] [place], [place] [address]    Gram Stain   Final    FEW WBC PRESENT, PREDOMINANTLY PMN ABUNDANT GRAM NEGATIVE RODS ABUNDANT GRAM POSITIVE COCCI FEW GRAM VARIABLE ROD    Culture   Final    MODERATE VIRIDANS STREPTOCOCCUS MODERATE PREVOTELLA BUCCAE FEW PREVOTELLA INTERMEDIA BETA LACTAMASE POSITIVE Performed at [facility], [place] [address] [place], [place] [address]    Report Status 03/11/2019 FINAL  Final  Gastrointestinal Panel by PCR , Stool     Status: None   Collection Time: 03/16/19  9:47 PM   Specimen: Stool  Result Value Ref Range Status   Campylobacter species NOT DETECTED NOT DETECTED Final   Plesimonas shigelloides NOT DETECTED NOT DETECTED Final   Salmonella species NOT DETECTED NOT DETECTED Final   Yersinia enterocolitica NOT DETECTED NOT DETECTED Final   Vibrio species NOT DETECTED NOT DETECTED Final   Vibrio cholerae NOT DETECTED NOT DETECTED Final   Enteroaggregative E coli (EAEC) NOT DETECTED NOT DETECTED Final   Enteropathogenic E coli (EPEC) NOT DETECTED NOT DETECTED Final   Enterotoxigenic E coli (ETEC) NOT DETECTED NOT DETECTED Final   Shiga like toxin producing E coli (STEC) NOT DETECTED NOT DETECTED Final   Shigella/Enteroinvasive E coli (EIEC) NOT DETECTED NOT DETECTED Final   Cryptosporidium NOT DETECTED NOT DETECTED Final   Cyclospora cayetanensis NOT DETECTED NOT DETECTED Final   Entamoeba histolytica NOT DETECTED NOT DETECTED Final   Giardia lamblia NOT DETECTED NOT DETECTED Final   Adenovirus F40/41 NOT DETECTED NOT DETECTED Final   Astrovirus NOT DETECTED NOT DETECTED Final   Norovirus GI/GII NOT DETECTED NOT DETECTED Final   Rotavirus A NOT DETECTED NOT DETECTED Final   Sapovirus (I, II, IV, and V) NOT DETECTED NOT DETECTED Final    Comment: Performed at [facility], [address] [place], [place] [address]  C Difficile Quick Screen w PCR reflex     Status: None    Collection Time: 03/16/19  9:47 PM   Specimen: STOOL  Result Value Ref Range Status   C Diff antigen NEGATIVE NEGATIVE Final   C Diff toxin NEGATIVE NEGATIVE Final   C Diff interpretation No C. difficile detected.  Final    Comment: Performed at [facility], [address] [place], [place] [address]    Coagulation Studies: No results for  input(s): LABPROT, INR in the last 72 hours.  Urinalysis: Recent Labs    03/18/19 0215  COLORURINE YELLOW*  LABSPEC 1.019  PHURINE 6.0  GLUCOSEU 50*  HGBUR SMALL*  BILIRUBINUR NEGATIVE  KETONESUR NEGATIVE  PROTEINUR 100*  NITRITE NEGATIVE  LEUKOCYTESUR SMALL*      Imaging: Dg Chest Port 1 View  Result Date: 03/18/2019 CLINICAL DATA:  Chest tube removal yesterday EXAM: PORTABLE CHEST 1 VIEW COMPARISON:  Chest radiograph from one day prior. 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 Stable cardiomediastinal silhouette with mild cardiomegaly. No appreciable pneumothorax. Small bilateral pleural effusions appear similar. Mild patchy bibasilar opacity, similar. No overt pulmonary edema. IMPRESSION: 1. No appreciable residual pneumothorax. 2. Similar mild patchy bibasilar lung opacities, favor atelectasis. Electronically Signed   By: Ilona Sorrel M.D.   On: 03/18/2019 08:11   Dg Chest Port 1 View  Result Date: 03/17/2019 CLINICAL DATA:  Empyema EXAM: PORTABLE CHEST 1 VIEW COMPARISON:  Two days ago FINDINGS: Mildly improved indistinct opacity at the right base that is both pleural and parenchymal based on CT from 6 days ago. The right diaphragm is now better visualized. The right chest tube is in stable position. Small adjacent pneumothorax at the lateral right base Cardiomegaly with dual-chamber pacer/ICD leads. IMPRESSION: 1. Mildly improved aeration at the right base. 2. Small basal pneumothorax adjacent to the right chest tube. Electronically Signed   By: Monte Fantasia M.D.   On: 03/17/2019 07:53      Medications:   . sodium chloride 250 mL (03/17/19 2322)   . amiodarone  200 mg Oral Daily  . amoxicillin-clavulanate  1 tablet Oral Q12H  . aspirin EC  81 mg Oral Daily  . atorvastatin  80 mg Oral Daily  . Chlorhexidine Gluconate Cloth  6 each Topical Q0600  . feeding supplement (NEPRO CARB STEADY)  237 mL Oral BID BM  . finasteride  5 mg Oral Daily  . insulin aspart  0-9 Units Subcutaneous TID AC & HS  . insulin aspart  5 Units Subcutaneous TID WC  . insulin detemir  13 Units Subcutaneous Daily  . lactobacillus  1 g Oral TID WC  . metoprolol tartrate  12.5 mg Oral BID  . niacin  1,000 mg Oral QHS  . pantoprazole  40 mg Oral Daily  . rivaroxaban  20 mg Oral Daily  . tamsulosin  0.4 mg Oral QPC breakfast   sodium chloride, acetaminophen **OR** acetaminophen, albuterol, loperamide, morphine injection, nitroGLYCERIN, polyethylene glycol  Assessment/ Plan:  [name] is a 68 y.o. white male with diabetes mellitus type II, hypertension, obstructive sleep apnea, congestive heart failure, atrial fibrillation, hyperlipidemia, Crohn's disease, coronary artery disease, GERD, pacemaker,who was admitted to [facility] on acute exacerbation of COPD, acute exacerbation of congestive heart failure, right pleural effusion status post chest tube placement, acute respiratory failure requiring BIPAP, sepsis with pneumonia and acute renal failure requiring hemodialysis   1. Acute renal failure: baseline creatinine of 1.1, normal GFR on 01/02/2019.  with underlying diabetic nephropathy with glucosuria.  Nonoliguric urine output. No indication for dialysis. Creatinine improving.  Serologic work up negative so far.   2. Acute respiratory failure:  chest tube from 8/5 to 8/16.  Secondary to pneumonia, congestive heart failure and COPD exacerbation  3. Acute exacerbation of systolic congestive heart failure:  7/10 echocardiogram 35-40% Currently holding diuretics.    4. Hypertension with  atrial fibrillation - metoprolol restarted.   5. Hypokalemia - Restart potassium PO  LOS: [place] 8/17/202011:19 AM

## 2019-03-18 NOTE — Care Management Important Message (Signed)
Important Message  Patient Details  Name: Colton Lamb MRN: [id number] Date of Birth: [birth year]   Medicare Important Message Given:  Yes     Dannette Barbara 03/18/2019, 4:22 PM

## 2019-03-18 NOTE — TOC Progression Note (Addendum)
Transition of Care [facility]) - Progression Note    Patient Details  Name: Colton Lamb MRN: [id number] Date of Birth: 1951-07-20  Transition of Care [facility]) CM/SW [place], LCSW Phone Number: 03/18/2019, 12:26 PM  Clinical Narrative: CSW met with patient and confirmed that [facility] is still first preference. Admissions coordinator is aware and will review referral.    1:21 pm: [facility] can take patient today/tomorrow if stable. Sent message to MD to notify and requested a COVID test.  Expected Discharge Plan: Home/Self Care Barriers to Discharge: Continued Medical Work up  Expected Discharge Plan and Services Expected Discharge Plan: Home/Self Care       Living arrangements for the past 2 months: Single Family Home                                       Social Determinants of Health (SDOH) Interventions    Readmission Risk Interventions Readmission Risk Prevention Plan 03/11/2019  Transportation Screening Complete  Social Work Consult for [place] Planning/Counseling [place] Not Applicable  Medication Review Press photographer) Complete  Some recent data might be hidden

## 2019-03-18 NOTE — Progress Notes (Signed)
Patient ID: Colton Lamb, male   DOB: 1950-12-21, 68 y.o.   MRN: [id number]  [facility] PROGRESS NOTE  Colton Lamb KLK:[id number] DOB: 1951/03/20 DOA: 03/04/2019 PCP: Toni Arthurs, NP  HPI/Subjective: Patient feeling better today.  Last night had episodes of fast heart rate and medications needed to be given.  Patient does have a history of paroxysmal atrial fibrillation and was on amiodarone and Xarelto prior to coming in.  Objective: Vitals:   03/18/19 1116 03/18/19 1216  BP:  101/80  Pulse: (!) 106 89  Resp:  20  Temp:  (!) 97.4 F (36.3 C)  SpO2: 97% 100%    Filed Weights   03/05/19 1730 03/06/19 1545 03/06/19 1549  Weight: 132.1 kg 129.9 kg 129.9 kg    ROS: Review of Systems  Constitutional: Positive for malaise/fatigue. Negative for chills and fever.  Eyes: Negative for blurred vision.  Respiratory: Negative for cough, shortness of breath and wheezing.   Cardiovascular: Negative for chest pain.  Gastrointestinal: Negative for abdominal pain, constipation, diarrhea, nausea and vomiting.  Genitourinary: Negative for dysuria.  Musculoskeletal: Negative for joint pain.  Neurological: Negative for dizziness and headaches.   Exam: Physical Exam  Constitutional: He is oriented to person, place, and time.  HENT:  Nose: No mucosal edema.  Mouth/Throat: No oropharyngeal exudate or posterior oropharyngeal edema.  Eyes: Pupils are equal, round, and reactive to light. Conjunctivae, EOM and lids are normal.  Neck: No JVD present. Carotid bruit is not present. No edema present. No thyroid mass and no thyromegaly present.  Cardiovascular: S1 normal and S2 normal. Exam reveals no gallop.  No murmur heard. Pulses:      Dorsalis pedis pulses are 2+ on the right side and 2+ on the left side.  Respiratory: No respiratory distress. He has decreased breath sounds in the right lower field and the left lower field. He has no wheezes. He has no rhonchi. He has no rales.  GI:  Soft. Bowel sounds are normal. There is no abdominal tenderness.  Musculoskeletal:     Right ankle: He exhibits swelling.     Left ankle: He exhibits swelling.  Lymphadenopathy:    He has no cervical adenopathy.  Neurological: He is alert and oriented to person, place, and time. No cranial nerve deficit.  Skin: Skin is warm. Nails show no clubbing.  Buttock large area of purplish discoloration with 2 areas that look like small skin ulcerations bilaterally.  I would grade this stage II sacral decubiti  Psychiatric: He has a normal mood and affect.      Data Reviewed: Basic Metabolic Panel: Recent Labs  Lab 03/13/19 1003 03/14/19 0554 03/15/19 0828 03/16/19 0526 03/18/19 0430  NA 132* 134* 133* 137 135  K 2.9* 3.4* 3.2* 3.3* 3.1*  CL 99 97* 99 103 92*  CO2 21* 25 21* 24 25  GLUCOSE 244* 201* 166* 185* 168*  BUN 125* 121* 102* 89* 60*  CREATININE 3.52* 3.32* 2.39* 1.98* 1.74*  CALCIUM 7.4* 7.7* 7.8* 7.7* 7.9*  MG  --  2.9*  --   --   --    CBC: Recent Labs  Lab 03/12/19 0348 03/13/19 1003 03/14/19 0554 03/15/19 0828 03/16/19 0526 03/18/19 0430  WBC 31.4* 31.7* 25.3* 23.5* 24.2* 20.6*  NEUTROABS 28.8*  --   --   --   --   --   HGB 10.0* 10.7* 10.4* 10.4* 10.1* 9.5*  HCT 31.2* 33.0* 32.9* 33.0* 32.0* 30.1*  MCV 85.7 83.8 85.7 85.7 87.0  87.2  PLT 218 213 214 226 228 202  BNP (last 3 results) Recent Labs    03/04/19 1414  BNP 972.0*    CBG: Recent Labs  Lab 03/17/19 1155 03/17/19 1640 03/17/19 2122 03/18/19 0730 03/18/19 1134  GLUCAP 181* 150* 165* 157* 194*     Studies: Dg Chest Port 1 View  Result Date: 03/18/2019 CLINICAL DATA:  Chest tube removal yesterday EXAM: PORTABLE CHEST 1 VIEW COMPARISON:  Chest radiograph from one day prior. 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 Stable cardiomediastinal silhouette with mild cardiomegaly. No appreciable pneumothorax. Small bilateral pleural effusions appear similar. Mild patchy  bibasilar opacity, similar. No overt pulmonary edema. IMPRESSION: 1. No appreciable residual pneumothorax. 2. Similar mild patchy bibasilar lung opacities, favor atelectasis. Electronically Signed   By: Ilona Sorrel M.D.   On: 03/18/2019 08:11   Dg Chest Port 1 View  Result Date: 03/17/2019 CLINICAL DATA:  Empyema EXAM: PORTABLE CHEST 1 VIEW COMPARISON:  Two days ago FINDINGS: Mildly improved indistinct opacity at the right base that is both pleural and parenchymal based on CT from 6 days ago. The right diaphragm is now better visualized. The right chest tube is in stable position. Small adjacent pneumothorax at the lateral right base Cardiomegaly with dual-chamber pacer/ICD leads. IMPRESSION: 1. Mildly improved aeration at the right base. 2. Small basal pneumothorax adjacent to the right chest tube. Electronically Signed   By: Monte Fantasia M.D.   On: 03/17/2019 07:53    Scheduled Meds: . amiodarone  200 mg Oral Daily  . amoxicillin-clavulanate  1 tablet Oral Q12H  . aspirin EC  81 mg Oral Daily  . atorvastatin  80 mg Oral Daily  . Chlorhexidine Gluconate Cloth  6 each Topical Q0600  . feeding supplement (NEPRO CARB STEADY)  237 mL Oral BID BM  . finasteride  5 mg Oral Daily  . insulin aspart  0-9 Units Subcutaneous TID AC & HS  . insulin aspart  5 Units Subcutaneous TID WC  . insulin detemir  13 Units Subcutaneous Daily  . lactobacillus  1 g Oral TID WC  . niacin  1,000 mg Oral QHS  . pantoprazole  40 mg Oral Daily  . potassium chloride  20 mEq Oral Daily  . rivaroxaban  20 mg Oral Daily  . tamsulosin  0.4 mg Oral QPC breakfast   Continuous Infusions: . sodium chloride 250 mL (03/17/19 2322)    Assessment/Plan:  1. Septic shock secondary to pneumonia and empyema.  Patient has improved.  Patient is off IV steroids.  IV Unasyn switched over to p.o. Augmentin, likely will need 1 more week of antibiotic.  Patient had Prevotella and strep viridans growing out of the empyema culture.   With infection in the pleural space will need prolonged course of antibiotic.  Chest tube removed yesterday. 2. SVT and paroxysmal atrial fibrillation.  Restart Xarelto since creatinine is better and use amiodarone to control heart rate.  Discontinue metoprolol which was started last night. 3. Relative hypotension.  Continue to monitor.  Hold metoprolol. 4. Acute hypoxic respiratory failure.  Patient currently on room air 5. Acute kidney injury.  Creatinine slowly improving and down to 1.74 today.  Maximum creatinine was 3.99.  6. Type 2 diabetes mellitus on low-dose detemir insulin and sliding scale 7. Hyperlipidemia on atorvastatin 8. Morbid obesity.  Weight loss needed 9. Weakness.  Physical therapy recommends CIR. 10. Diarrhea.  Stool studies are negative.  PRN Imodium. Could be antibiotic related. 11. Urinary  retention and Foley catheter placed in the ICU.  Continue Flomax and Proscar.  Hopefully can do a voiding trial in 6 days  Code Status:     Code Status Orders  (From admission, onward)         Start     Ordered   03/04/19 2022  Full code  Continuous     03/04/19 2021        Code Status History    Date Active Date Inactive Code Status Order ID Comments User Context   09/07/2016 0100 09/09/2016 2001 Full Code [id number]  Harvie Bridge, DO Inpatient   04/16/2014 1552 04/17/2014 1512 Full Code [id number]  Evans Lance, MD Inpatient   12/11/2013 2042 12/14/2013 1516 Full Code [id number]  Eileen Stanford, PA-C Inpatient   11/22/2013 1434 11/30/2013 2000 Full Code [id number]  Belva Crome, MD Inpatient   11/22/2013 0916 11/22/2013 1434 Full Code [id number]  Dorothy Spark, MD Inpatient   11/22/2013 0829 11/22/2013 0916 Full Code [id number]  Barrett, Evelene Croon, PA-C Inpatient   Advance Care Planning Activity     Family Communication: Family today Disposition Plan: Potential to rehab tomorrow if hemodynamically  stable  Consultants:  Pulmonary  Nephrology  Procedures:  Chest tube placement and chest tube removal.  Antibiotics:  Augmentin  Time spent: 28 minutes  [place]

## 2019-03-18 NOTE — Progress Notes (Signed)
Physical Therapy Treatment Patient Details Name: Colton Lamb MRN: [id number] DOB: [birth year] Today's Date: 03/18/2019    History of Present Illness Patient admitted for sepsis with complicated hospital stay requiring temp HD via R IJ temp cath. Pt has R chest tube with improving R side empyema. Pt has been weaned off BP drip with vitals WNL this date.    PT Comments    Co treat with OT this session. Pt demonstrating improved mobility throughout this session. Decreasing assist with all functional mobility: bed mobility Mod A of 1 to Min A of 2. STS with Mod A of 2 with improvement to attaining full upright stance for a short time. Performed 3 times this session. Unable to ambulate at this time. Exercises with focus on proper breathing throughout performed sitting edge of bed and supine. Heart rate to the upper 120s with stand effort with quick recovery to baseline (low 100's). Pt requires total assist with hygiene and clothing change. Continue PT to progress strength, endurance, balance and out of bed activity tolerance to improve functional mobility.    Follow Up Recommendations  CIR     Equipment Recommendations  Rolling walker with 5" wheels    Recommendations for Other Services       Precautions / Restrictions Precautions Precautions: Fall Restrictions Weight Bearing Restrictions: No    Mobility  Bed Mobility Overal bed mobility: Needs Assistance Bed Mobility: Supine to Sit;Sit to Supine     Supine to sit: Mod assist Sit to supine: Min assist;+2 for physical assistance   General bed mobility comments: Pt mobilizes LEs with improved abiltity this session over edge of bed; requires Mod A for trunk to sit. Min at trunk and LEs to return to supine  Transfers Overall transfer level: Needs assistance Equipment used: Rolling walker (2 wheeled) Transfers: Sit to/from Stand Sit to Stand: Mod assist;+2 physical assistance, bed elevated         General transfer comment:  Much improved ability to stand this session especially with quad activation; requires increased cueing for glut activation, but can come to full upright stance for a short time before LEs fatigue requiring urgent need to sit  Ambulation/Gait             General Gait Details: Attempted; pt takes 2 very small steps toward chair with urgent needt o sit.    Stairs             Wheelchair Mobility    Modified Rankin (Stroke Patients Only)       Balance Overall balance assessment: Needs assistance;History of Falls Sitting-balance support: Single extremity supported;Bilateral upper extremity supported;Feet supported Sitting balance-Leahy Scale: Good Sitting balance - Comments: Steady reaching within BOS. Tended to use 1 UE to support self. When seated. Brief instances of sitting w/o UE support.   Standing balance support: Bilateral upper extremity supported Standing balance-Leahy Scale: Fair Standing balance comment: Heavy use of UE on RW on this date with +2 CGA for safety. Pt able to come to full stand, but unable to attempt amb.                            Cognition Arousal/Alertness: Awake/alert Behavior During Therapy: WFL for tasks assessed/performed Overall Cognitive Status: Within Functional Limits for tasks assessed  Exercises General Exercises - Lower Extremity Ankle Circles/Pumps: AROM;Both;10 reps Quad Sets: Strengthening;Both;5 reps Gluteal Sets: Strengthening;Both;5 reps Long Arc Quad: AROM;Both;10 reps Hip Flexion/Marching: AROM;Both;10 reps Toe Raises: AROM;Both;10 reps Heel Raises: AROM;Both;10 reps Other Exercises Other Exercises: 3 STS with stand tolerance working on upright posture Other Exercises: Pt provided with reinforcement on importance of taking rest breaks during physical activity as an ECS as well as use of PLB when he begins to feel SOB. Pt return demonstrated.    General  Comments        Pertinent Vitals/Pain Pain Assessment: No/denies pain    Home Living                      Prior Function            PT Goals (current goals can now be found in the care plan section) Acute Rehab PT Goals Patient Stated Goal: to get stronger Progress towards PT goals: Progressing toward goals    Frequency    7X/week      PT Plan Current plan remains appropriate    Co-evaluation   Reason for Co-Treatment: Complexity of the patient's impairments (multi-system involvement);For patient/therapist safety;To address functional/ADL transfers PT goals addressed during session: Mobility/safety with mobility;Strengthening/ROM OT goals addressed during session: ADL's and self-care;Proper use of Adaptive equipment and DME      AM-PAC PT "6 Clicks" Mobility   Outcome Measure  Help needed turning from your back to your side while in a flat bed without using bedrails?: A Lot Help needed moving from lying on your back to sitting on the side of a flat bed without using bedrails?: A Lot Help needed moving to and from a bed to a chair (including a wheelchair)?: Total Help needed standing up from a chair using your arms (e.g., wheelchair or bedside chair)?: Total Help needed to walk in hospital room?: Total Help needed climbing 3-5 steps with a railing? : Total 6 Click Score: 8    End of Session Equipment Utilized During Treatment: Gait belt Activity Tolerance: Patient tolerated treatment well;Patient limited by fatigue Patient left: in bed;with call bell/phone within reach;with bed alarm set   PT Visit Diagnosis: Muscle weakness (generalized) (M62.81);History of falling (Z91.81);Difficulty in walking, not elsewhere classified (R26.2)     Time: 5170-0174 Co treatment with OT PT Time Calculation (min) (ACUTE ONLY): 40 min  Charges:  $Therapeutic Exercise: 8-22 mins $Therapeutic Activity: 8-22 mins                      Larae Grooms, PTA 03/18/2019,  2:11 PM

## 2019-03-19 ENCOUNTER — Ambulatory Visit: Payer: Medicare Other | Admitting: Family

## 2019-03-19 LAB — BASIC METABOLIC PANEL
Anion gap: 12 (ref 5–15)
BUN: 51 mg/dL — ABNORMAL HIGH (ref 8–23)
CO2: 23 mmol/L (ref 22–32)
Calcium: 8 mg/dL — ABNORMAL LOW (ref 8.9–10.3)
Chloride: 99 mmol/L (ref 98–111)
Creatinine, Ser: 1.73 mg/dL — ABNORMAL HIGH (ref 0.61–1.24)
GFR calc Af Amer: 46 mL/min — ABNORMAL LOW (ref >60)
GFR calc non Af Amer: 40 mL/min — ABNORMAL LOW (ref >60)
Glucose, Bld: 123 mg/dL — ABNORMAL HIGH (ref 70–99)
Potassium: 3.3 mmol/L — ABNORMAL LOW (ref 3.5–5.1)
Sodium: 134 mmol/L — ABNORMAL LOW (ref 135–145)

## 2019-03-19 LAB — SARS CORONAVIRUS 2 BY RT PCR (HOSPITAL ORDER, PERFORMED IN ~~LOC~~ HOSPITAL LAB): SARS Coronavirus 2: NEGATIVE

## 2019-03-19 LAB — GLUCOSE, CAPILLARY
Glucose-Capillary: 104 mg/dL — ABNORMAL HIGH (ref 70–99)
Glucose-Capillary: 144 mg/dL — ABNORMAL HIGH (ref 70–99)

## 2019-03-19 MED ORDER — FINASTERIDE 5 MG PO TABS: 5.0000 mg | ORAL_TABLET | Freq: Every day | ORAL | 0 refills | Status: AC

## 2019-03-19 MED ORDER — INSULIN ASPART 100 UNIT/ML ~~LOC~~ SOLN: 5.0000 [IU] | Freq: Three times a day (TID) | SUBCUTANEOUS | 11 refills | Status: AC

## 2019-03-19 MED ORDER — POTASSIUM CHLORIDE CRYS ER 20 MEQ PO TBCR: 20.0000 meq | EXTENDED_RELEASE_TABLET | Freq: Every day | ORAL | 0 refills | Status: DC

## 2019-03-19 MED ORDER — METOPROLOL SUCCINATE ER 25 MG PO TB24: 25.0000 mg | ORAL_TABLET | Freq: Every day | ORAL | Status: DC

## 2019-03-19 MED ORDER — ALBUTEROL SULFATE (2.5 MG/3ML) 0.083% IN NEBU: 2.5000 mg | INHALATION_SOLUTION | Freq: Four times a day (QID) | RESPIRATORY_TRACT | 12 refills | Status: DC | PRN

## 2019-03-19 MED ORDER — AMOXICILLIN-POT CLAVULANATE 875-125 MG PO TABS: 1.0000 | ORAL_TABLET | Freq: Two times a day (BID) | ORAL | 0 refills | Status: AC

## 2019-03-19 MED ORDER — METOPROLOL SUCCINATE ER 25 MG PO TB24: 25.0000 mg | ORAL_TABLET | Freq: Every day | ORAL | 0 refills | Status: DC

## 2019-03-19 MED ORDER — FLORANEX PO PACK: 1.0000 g | PACK | Freq: Three times a day (TID) | ORAL | 0 refills | Status: DC

## 2019-03-19 MED ORDER — INSULIN DETEMIR 100 UNIT/ML ~~LOC~~ SOLN: 13.0000 [IU] | Freq: Every day | SUBCUTANEOUS | 11 refills | Status: DC

## 2019-03-19 MED ORDER — NEPRO/CARBSTEADY PO LIQD: 237.0000 mL | Freq: Two times a day (BID) | ORAL | 0 refills | Status: DC

## 2019-03-19 MED ORDER — TAMSULOSIN HCL 0.4 MG PO CAPS: 0.4000 mg | ORAL_CAPSULE | Freq: Every day | ORAL | 0 refills | Status: AC

## 2019-03-19 MED ORDER — AMIODARONE HCL 200 MG PO TABS: 200.0000 mg | ORAL_TABLET | Freq: Every day | ORAL | 0 refills | Status: DC

## 2019-03-19 NOTE — Consult Note (Addendum)
[place] Nurse wound consult note Reason for Consult: Consult requested for perineum/buttocks.  According to the nursing flowsheet, the patient was noted to have a stage 2 pressure injury on 8/14, and this has declined to a deep tissue pressure injury/possible unstageable pressure injury; dark purple-black, 9.5X12cm, according to the EMR.  The [facility] team is working remotely today and will assess the location in person tomorrow.   Pressure Injury POA: No Dressing procedure/placement/frequency: Barrier cream to protect from further injury; I will revise the plan of care tomorrow if further topical  treatment is necessary. Julien Girt MSN, RN, [place], [place], [place]

## 2019-03-19 NOTE — Discharge Summary (Signed)
[facility] NAME: Colton Lamb    MR#:  [id number]  DATE OF BIRTH:  1951-01-17  DATE OF ADMISSION:  03/04/2019 ADMITTING PHYSICIAN: [name], MD  DATE OF DISCHARGE: 03/19/2019  PRIMARY CARE PHYSICIAN: Toni Arthurs, NP    ADMISSION DIAGNOSIS:  SOB (shortness of breath) [R06.02] Pleural effusion on right [T26] Chronic diastolic congestive heart failure (HCC) [I50.32] Sepsis with acute hypoxic respiratory failure, due to unspecified organism, unspecified whether septic shock present ([place]) [A41.9, R65.20, J96.01]  DISCHARGE DIAGNOSIS:  Active Problems:   Sepsis ([place] [place])   Pleural effusion on right   Acute renal failure (ARF) (HCC)   Empyema lung (HCC)   Shortness of breath   Pressure injury of skin   SECONDARY DIAGNOSIS:   Past Medical History:  Diagnosis Date  . Atrial flutter ([place])    a. s/p Cardioversion 11/22/13, on amiodarone and Xarelto.  . Chronic systolic heart failure ([place])    a. 10/2013 EF 20-25%, grade III DD, RV mildly dilated and sys fx mild/mod reduced;  b. 01/2014 Echo: EF 30-35%, gr3 DD, mod dil LA.  Marland Kitchen Coronary artery disease    a. s/p MI 2007/2015;  b. s/p prior PCI to the LAD/LCX/PDA/PL;  c. 2008: s/p Cypher DES to the OM.  Marland Kitchen Crohn's ileocolitis ([place])   . GERD (gastroesophageal reflux disease)   . Hx of adenomatous colonic polyps 11/2003  . Hyperlipidemia   . Hypertension   . Ischemic cardiomyopathy    s. 01/2014 s/p MDT [id number] [name] XT DR single lead AICD.  Marland Kitchen Obesity   . Paroxysmal atrial fibrillation (HCC)    a. CHA2DS2VASc = 4-->xarelto/amio.  . Sleep apnea   . Syncope    a.  11/2013 in setting of volume depletion and bradycardia due to dig toxicity   . Type II diabetes mellitus ([place])     HOSPITAL COURSE:   1.  Septic shock secondary to pneumonia and empyema.  The patient was given IV steroids.  The patient was given IV Unasyn and switched over to p.o. Augmentin and will need 1 more week of  antibiotics then discontinue.  The patient had Prevotella and strep viridans growing out of the empyema culture.  Case discussed with Dr. Jamal Collin pulmonary critical care and he believes this is likely aspiration.  The patient had a chest tube for most of the hospital stay and was removed 2 days ago. 2.  SVT and paroxysmal atrial fibrillation.  I restarted Xarelto yesterday since creatinine is better.  I restarted amiodarone.  Since blood pressure is better I can start Toprol XL. 3.  Acute hypoxic respiratory failure.  Patient was on oxygen during the hospital course and is now off of oxygen. 4.  Acute kidney injury.  Creatinine was as high as 3.99 and is down to 1.73.  Patient was seen by nephrology during the hospital course and he will follow-up with nephrology as outpatient.  We will hold off on the patient's torsemide for right now. 5.  Type 2 diabetes mellitus on low-dose detemir insulin and sliding scale check fingersticks q. before meals and nightly. 6.  Hyperlipidemia unspecified on atorvastatin 7.  Morbid obesity.  Weight loss needed 8.  Weakness.  Patient 2 weeks ago home at this point. 9.  Diarrhea.  Send off stool studies.  Stool stool for C. difficile is negative.  Stool for comprehensive panel was negative. 10.  Urinary retention.  Patient had a Foley catheter placed in the ICU.  I started Flomax and Proscar the other day.  Would do a voiding trial in about 6 days.  If unable to take out Foley then can follow-up with urology. 11.  Stage II decubitus ulcer with diffuse areas of skin tear and purplish discoloration.  The patient must get up and move around and keep on changing position while he is in the bed. 12.  Chronic systolic congestive heart failure.  Entresto discontinued secondary to hypotension and acute kidney injury.  Patient is breathing comfortably currently.  Continue to hold torsemide at this time.  Recommend checking a BMP in 1 week.  Can consider restarting torsemide if  creatinine continues to improve. 13.  White blood cell count persistently elevated but looking back at old labs and these were elevated at that time to.  Patient may have an underlying CLL.  If white blood cell count still elevated and repeat check can also refer to hematology.  DISCHARGE CONDITIONS:   Fair  CONSULTS OBTAINED:  Treatment Team:  Lavonia Dana, MD  DRUG ALLERGIES:   Allergies  Allergen Reactions  . Iodine Other (See Comments)    Shortness of breath, swelling and hives  . Shrimp [Shellfish Allergy] Other (See Comments)    SWELLING    HIVES    SHORTNESS OF BREATH  . Tetracycline Rash    DISCHARGE MEDICATIONS:   Allergies as of 03/19/2019      Reactions   Iodine Other (See Comments)   Shortness of breath, swelling and hives   Shrimp [shellfish Allergy] Other (See Comments)   SWELLING    HIVES    SHORTNESS OF BREATH   Tetracycline Rash      Medication List    STOP taking these medications   carvedilol 12.5 MG tablet Commonly known as: COREG   Entresto 49-51 MG Generic drug: sacubitril-valsartan   Jardiance 10 MG Tabs tablet Generic drug: empagliflozin   metFORMIN 1000 MG tablet Commonly known as: GLUCOPHAGE   niacin 1000 MG CR tablet Commonly known as: NIASPAN   potassium chloride 10 MEQ tablet Commonly known as: K-DUR   terbinafine 250 MG tablet Commonly known as: LamISIL   torsemide 20 MG tablet Commonly known as: DEMADEX     TAKE these medications   acetaminophen 325 MG tablet Commonly known as: TYLENOL Take 1-2 tablets (325-650 mg total) by mouth every 4 (four) hours as needed for mild pain.   albuterol (2.5 MG/3ML) 0.083% nebulizer solution Commonly known as: PROVENTIL Take 3 mLs (2.5 mg total) by nebulization every 6 (six) hours as needed for wheezing.   amiodarone 200 MG tablet Commonly known as: PACERONE Take 1 tablet (200 mg total) by mouth daily. Start taking on: March 20, 2019 What changed:   how much to take  how  to take this  when to take this  additional instructions   amoxicillin-clavulanate 875-125 MG tablet Commonly known as: AUGMENTIN Take 1 tablet by mouth every 12 (twelve) hours for 7 days.   aspirin 81 MG tablet Take 81 mg by mouth daily.   atorvastatin 80 MG tablet Commonly known as: LIPITOR TAKE 1 TABLET BY MOUTH EVERY DAY   balsalazide 750 MG capsule Commonly known as: COLAZAL TAKE 1 CAPSULE (750 MG TOTAL) BY MOUTH 3 (THREE) TIMES DAILY.   feeding supplement (NEPRO CARB STEADY) Liqd Take 237 mLs by mouth 2 (two) times daily between meals.   finasteride 5 MG tablet Commonly known as: PROSCAR Take 1 tablet (5 mg total) by mouth daily. Start taking on: March 20, 2019   insulin aspart 100 UNIT/ML injection Commonly known as: novoLOG Inject 5 Units into the skin 3 (three) times daily with meals.   insulin detemir 100 UNIT/ML injection Commonly known as: LEVEMIR Inject 0.13 mLs (13 Units total) into the skin daily.   lactobacillus Pack Take 1 packet (1 g total) by mouth 3 (three) times daily with meals.   metoprolol succinate 25 MG 24 hr tablet Commonly known as: TOPROL-XL Take 1 tablet (25 mg total) by mouth daily.   nitroGLYCERIN 0.4 MG SL tablet Commonly known as: NITROSTAT Place 0.4 mg under the tongue every 5 (five) minutes as needed (MAX 3 TABLETS). FOR CHEST PAIN   omeprazole 20 MG capsule Commonly known as: PRILOSEC Take 20 mg by mouth daily.   potassium chloride SA 20 MEQ tablet Commonly known as: K-DUR Take 1 tablet (20 mEq total) by mouth daily. Start taking on: March 20, 2019   tamsulosin 0.4 MG Caps capsule Commonly known as: FLOMAX Take 1 capsule (0.4 mg total) by mouth daily after breakfast. Start taking on: March 20, 2019   Xarelto 20 MG Tabs tablet Generic drug: rivaroxaban TAKE 1 TABLET BY MOUTH EVERY DAY WITH LUNCH        DISCHARGE INSTRUCTIONS:   Follow-up with team at rehab 1 day Follow-up nephrology as outpatient 2  weeks Follow-up pulmonary as outpatient  If you experience worsening of your admission symptoms, develop shortness of breath, life threatening emergency, suicidal or homicidal thoughts you must seek medical attention immediately by calling 911 or calling your MD immediately  if symptoms less severe.  You Must read complete instructions/literature along with all the possible adverse reactions/side effects for all the Medicines you take and that have been prescribed to you. Take any new Medicines after you have completely understood and accept all the possible adverse reactions/side effects.   Please note  You were cared for by a hospitalist during your hospital stay. If you have any questions about your discharge medications or the care you received while you were in the hospital after you are discharged, you can call the unit and asked to speak with the hospitalist on call if the hospitalist that took care of you is not available. Once you are discharged, your primary care physician will handle any further medical issues. Please note that NO REFILLS for any discharge medications will be authorized once you are discharged, as it is imperative that you return to your primary care physician (or establish a relationship with a primary care physician if you do not have one) for your aftercare needs so that they can reassess your need for medications and monitor your lab values.    Today   CHIEF COMPLAINT:   Chief Complaint  Patient presents with  . Shortness of Breath    HISTORY OF PRESENT ILLNESS:  Colton Lamb  is a 68 y.o. male came in with shortness of breath found to have pneumonia and empyema   VITAL SIGNS:  Blood pressure 127/61, pulse (!) 110, temperature 97.8 F (36.6 C), temperature source Oral, resp. rate 20, height 6' (1.829 m), weight 129.9 kg, SpO2 98 %.   PHYSICAL EXAMINATION:  GENERAL:  68 y.o.-year-old patient lying in the bed with no acute distress.  EYES: Pupils  equal, round, reactive to light and accommodation. No scleral icterus. Extraocular muscles intact.  HEENT: Head atraumatic, normocephalic. Oropharynx and nasopharynx clear.  NECK:  Supple, no jugular venous distention. No thyroid enlargement, no tenderness.  LUNGS: Normal breath sounds  bilaterally, no wheezing, rales,rhonchi or crepitation. No use of accessory muscles of respiration.  CARDIOVASCULAR: S1, S2 irregular regular. No murmurs, rubs, or gallops.  ABDOMEN: Soft, non-tender, non-distended. Bowel sounds present. No organomegaly or mass.  EXTREMITIES: 2+ pedal edema, no cyanosis, or clubbing.  NEUROLOGIC: Cranial nerves II through XII are intact. Muscle strength 5/5 in all extremities. Sensation intact. Gait not checked.  PSYCHIATRIC: The patient is alert and oriented x 3.  SKIN: No obvious rash, lesion, or ulcer.   DATA REVIEW:   CBC Recent Labs  Lab 03/18/19 0430  WBC 20.6*  HGB 9.5*  HCT 30.1*  PLT 202    Chemistries  Recent Labs  Lab 03/14/19 0554  03/19/19 0631  NA 134*   < > 134*  K 3.4*   < > 3.3*  CL 97*   < > 99  CO2 25   < > 23  GLUCOSE 201*   < > 123*  BUN 121*   < > 51*  CREATININE 3.32*   < > 1.73*  CALCIUM 7.7*   < > 8.0*  MG 2.9*  --   --    < > = values in this interval not displayed.    Cardiac Enzymes No results for input(s): TROPONINI in the last 168 hours.  Microbiology Results  Results for orders placed or performed during the hospital encounter of 03/04/19  Urine culture     Status: Abnormal   Collection Time: 03/04/19  2:14 PM   Specimen: Urine, Clean Catch  Result Value Ref Range Status   Specimen Description   Final    URINE, CLEAN CATCH Performed at [facility], [address] [place], [place] [address]    Special Requests   Final    NONE Performed at [facility], [address] [place], [place] [address]    Culture (A)  Final    <10,000 COLONIES/mL INSIGNIFICANT GROWTH Performed at [place], [place] [address], [place], [place] [address]    Report Status 03/06/2019 FINAL  Final  SARS Coronavirus 2 [facility] order, Performed in [facility] hospital lab) Nasopharyngeal Nasopharyngeal Swab     Status: None   Collection Time: 03/04/19  2:17 PM   Specimen: Nasopharyngeal Swab  Result Value Ref Range Status   SARS Coronavirus 2 NEGATIVE NEGATIVE Final    Comment: (NOTE) If result is NEGATIVE SARS-CoV-2 target nucleic acids are NOT DETECTED. The SARS-CoV-2 RNA is generally detectable in upper and lower  respiratory specimens during the acute phase of infection. The lowest  concentration of SARS-CoV-2 viral copies this assay can detect is 250  copies / mL. A negative result does not preclude SARS-CoV-2 infection  and should not be used as the sole basis for treatment or other  patient management decisions.  A negative result may occur with  improper specimen collection / handling, submission of specimen other  than nasopharyngeal swab, presence of viral mutation(s) within the  areas targeted by this assay, and inadequate number of viral copies  (<250 copies / mL). A negative result must be combined with clinical  observations, patient history, and epidemiological information. If result is POSITIVE SARS-CoV-2 target nucleic acids are DETECTED. The SARS-CoV-2 RNA is generally detectable in upper and lower  respiratory specimens dur ing the acute phase of infection.  Positive  results are indicative of active infection with SARS-CoV-2.  Clinical  correlation with patient history and other diagnostic information is  necessary to determine patient infection status.  Positive  results do  not rule out bacterial infection or co-infection with other viruses. If result is PRESUMPTIVE POSTIVE SARS-CoV-2 nucleic acids MAY BE PRESENT.   A presumptive positive result was obtained on the submitted specimen  and confirmed on repeat testing.  While 2019 novel coronavirus  (SARS-CoV-2) nucleic  acids may be present in the submitted sample  additional confirmatory testing may be necessary for epidemiological  and / or clinical management purposes  to differentiate between  SARS-CoV-2 and other Sarbecovirus currently known to infect humans.  If clinically indicated additional testing with an alternate test  methodology [id number]) is advised. The SARS-CoV-2 RNA is generally  detectable in upper and lower respiratory sp ecimens during the acute  phase of infection. The expected result is Negative. Fact Sheet for Patients:  StrictlyIdeas.no Fact Sheet for Healthcare Providers: BankingDealers.co.za This test is not yet approved or cleared by the [place] FDA and has been authorized for detection and/or diagnosis of SARS-CoV-2 by FDA under an Emergency Use Authorization (EUA).  This EUA will remain in effect (meaning this test can be used) for the duration of the COVID-19 declaration under Section 564(b)(1) of the Act, 21 U.S.C. section 360bbb-3(b)(1), unless the authorization is terminated or revoked sooner. Performed at [facility], [address] [place], [place] [address]   Blood Culture (routine x 2)     Status: None   Collection Time: 03/04/19  2:55 PM   Specimen: BLOOD  Result Value Ref Range Status   Specimen Description BLOOD BLOOD RIGHT HAND  Final   Special Requests   Final    BOTTLES DRAWN AEROBIC AND ANAEROBIC Blood Culture adequate volume   Culture   Final    NO GROWTH 5 DAYS Performed at [facility], [address] [place], [place] [address]    Report Status 03/09/2019 FINAL  Final  MRSA PCR Screening     Status: None   Collection Time: 03/04/19  8:36 PM   Specimen: Nasopharyngeal  Result Value Ref Range Status   MRSA by PCR NEGATIVE NEGATIVE Final    Comment:        The GeneXpert MRSA Assay (FDA approved for NASAL specimens only), is one component of a comprehensive MRSA  colonization surveillance program. It is not intended to diagnose MRSA infection nor to guide or monitor treatment for MRSA infections. Performed at [facility], [address] [place], [place] [address]   Blood Culture (routine x 2)     Status: None   Collection Time: 03/04/19 10:01 PM   Specimen: BLOOD  Result Value Ref Range Status   Specimen Description BLOOD RIGHT ASSIST CONTROL  Final   Special Requests   Final    BOTTLES DRAWN AEROBIC AND ANAEROBIC Blood Culture adequate volume   Culture   Final    NO GROWTH 5 DAYS Performed at [facility], [address], [place], [place] [address]    Report Status 03/09/2019 FINAL  Final  Body fluid culture     Status: None   Collection Time: 03/05/19 11:21 AM   Specimen: [facility] Cytology Pleural fluid  Result Value Ref Range Status   Specimen Description   Final    PLEURAL Performed at [facility], [address] [place], [place] [address]    Special Requests   Final    NONE Performed at [facility], [address] [place], [place] [address]    Gram Stain   Final    FEW WBC PRESENT, PREDOMINANTLY PMN  ABUNDANT GRAM NEGATIVE RODS ABUNDANT GRAM POSITIVE COCCI FEW GRAM VARIABLE ROD    Culture   Final    MODERATE VIRIDANS STREPTOCOCCUS MODERATE PREVOTELLA BUCCAE FEW PREVOTELLA INTERMEDIA BETA LACTAMASE POSITIVE Performed at [facility], [place] [address], [place], [place] [address]    Report Status 03/11/2019 FINAL  Final  Gastrointestinal Panel by PCR , Stool     Status: None   Collection Time: 03/16/19  9:47 PM   Specimen: STOOL  Result Value Ref Range Status   Campylobacter species NOT DETECTED NOT DETECTED Final   Plesimonas shigelloides NOT DETECTED NOT DETECTED Final   Salmonella species NOT DETECTED NOT DETECTED Final   Yersinia enterocolitica NOT DETECTED NOT DETECTED Final   Vibrio species NOT DETECTED NOT DETECTED Final   Vibrio cholerae NOT DETECTED NOT DETECTED  Final   Enteroaggregative E coli (EAEC) NOT DETECTED NOT DETECTED Final   Enteropathogenic E coli (EPEC) NOT DETECTED NOT DETECTED Final   Enterotoxigenic E coli (ETEC) NOT DETECTED NOT DETECTED Final   Shiga like toxin producing E coli (STEC) NOT DETECTED NOT DETECTED Final   Shigella/Enteroinvasive E coli (EIEC) NOT DETECTED NOT DETECTED Final   Cryptosporidium NOT DETECTED NOT DETECTED Final   Cyclospora cayetanensis NOT DETECTED NOT DETECTED Final   Entamoeba histolytica NOT DETECTED NOT DETECTED Final   Giardia lamblia NOT DETECTED NOT DETECTED Final   Adenovirus F40/41 NOT DETECTED NOT DETECTED Final   Astrovirus NOT DETECTED NOT DETECTED Final   Norovirus GI/GII NOT DETECTED NOT DETECTED Final   Rotavirus A NOT DETECTED NOT DETECTED Final   Sapovirus (I, II, IV, and V) NOT DETECTED NOT DETECTED Final    Comment: Performed at [facility], [address] [place], [place] [address]  C Difficile Quick Screen w PCR reflex     Status: None   Collection Time: 03/16/19  9:47 PM   Specimen: STOOL  Result Value Ref Range Status   C Diff antigen NEGATIVE NEGATIVE Final   C Diff toxin NEGATIVE NEGATIVE Final   C Diff interpretation No C. difficile detected.  Final    Comment: Performed at [facility], [address] [place],  [address]    RADIOLOGY:  Dg Chest Port 1 View  Result Date: 03/18/2019 CLINICAL DATA:  Chest tube removal yesterday EXAM: PORTABLE CHEST 1 VIEW COMPARISON:  Chest radiograph from one day prior. 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 Stable cardiomediastinal silhouette with mild cardiomegaly. No appreciable pneumothorax. Small bilateral pleural effusions appear similar. Mild patchy bibasilar opacity, similar. No overt pulmonary edema. IMPRESSION: 1. No appreciable residual pneumothorax. 2. Similar mild patchy bibasilar lung opacities, favor atelectasis. Electronically Signed   By: Ilona Sorrel M.D.   On:  03/18/2019 08:11    Management plans discussed with the patient, family and they are in agreement.  CODE STATUS:     Code Status Orders  (From admission, onward)         Start     Ordered   03/04/19 2022  Full code  Continuous     03/04/19 2021        Code Status History    Date Active Date Inactive Code Status Order ID Comments User Context   09/07/2016 0100 09/09/2016 2001 Full Code [id number]  Harvie Bridge, DO Inpatient   04/16/2014 1552 04/17/2014 1512 Full Code [id number]  Evans Lance, MD Inpatient   12/11/2013 2042 12/14/2013 1516 Full Code [id number]  Eileen Stanford, PA-C Inpatient   11/22/2013  Winnie 11/30/2013 2000 Full Code [id number]  Belva Crome, MD Inpatient   11/22/2013 0916 11/22/2013 1434 Full Code [id number]  Dorothy Spark, MD Inpatient   11/22/2013 0829 11/22/2013 0916 Full Code [id number]  Barrett, Evelene Croon, PA-C Inpatient   Advance Care Planning Activity      TOTAL TIME TAKING CARE OF THIS PATIENT: 35 minutes.    Loletha Grayer M.D on 03/19/2019 at 10:22 AM  Between 7am to 6pm - Pager - [phone]  After 6pm go to [email or web address] - password [email or web address] [facility]  [phone]  CC: Primary care physician; Toni Arthurs, NP

## 2019-03-19 NOTE — Discharge Instructions (Signed)

## 2019-03-19 NOTE — Progress Notes (Signed)
No distress.  No new complaints.  No recent fevers.  Minimal chest tube output.   Vitals:   03/18/19 1116 03/18/19 1216 03/18/19 2008 03/19/19 0534  BP:  101/80 109/70 127/61  Pulse: (!) 106 89 (!) 105 (!) 110  Resp:  20 18 20   Temp:  (!) 97.4 F (36.3 C) 98.2 F (36.8 C) 97.8 F (36.6 C)  TempSrc:  Oral Oral Oral  SpO2: 97% 100% 100% 98%  Weight:      Height:      Room air  Gen: NAD HEENT: NCAT, sclerae white Neck: No JVD Lungs: breath sounds full, no wheezes or other adventitious sounds Cardiovascular: RRR, no murmurs Abdomen: Soft, nontender, normal BS Ext: without clubbing, cyanosis, edema Neuro: grossly intact Skin: Limited exam, no lesions noted   Repeat exam was performed and revealed no significant changes    BMP Latest Ref Rng & Units 03/19/2019 03/18/2019 03/16/2019  Glucose 70 - 99 mg/dL 123(H) 168(H) 185(H)  BUN 8 - 23 mg/dL 51(H) 60(H) 89(H)  Creatinine 0.61 - 1.24 mg/dL 1.73(H) 1.74(H) 1.98(H)  BUN/Creat Ratio 10 - 24 - - -  Sodium 135 - 145 mmol/L 134(L) 135 137  Potassium 3.5 - 5.1 mmol/L 3.3(L) 3.1(L) 3.3(L)  Chloride 98 - 111 mmol/L 99 92(L) 103  CO2 22 - 32 mmol/L 23 25 24   Calcium 8.9 - 10.3 mg/dL 8.0(L) 7.9(L) 7.7(L)    CBC Latest Ref Rng & Units 03/18/2019 03/16/2019 03/15/2019  WBC 4.0 - 10.5 K/uL 20.6(H) 24.2(H) 23.5(H)  Hemoglobin 13.0 - 17.0 g/dL 9.5(L) 10.1(L) 10.4(L)  Hematocrit 39.0 - 52.0 % 30.1(L) 32.0(L) 33.0(L)  Platelets 150 - 400 K/uL 202 228 226    CXR 08/17: NSC  IMPRESSION: R empyema with Prevotella and viridans strep - day 14 abx CXR stable after pleural catheter removal  PLAN/REC: Change abx to Augmentin and complete 7 more days I have arranged follow up with me in the office on Sept 1 with CXR prior  PCCM will sign off. Please call if we can be of further assistance  Merton Border, MD PCCM service Mobile [phone] Pager [phone] 03/19/2019 11:30 AM

## 2019-03-19 NOTE — TOC Transition Note (Signed)
Transition of Care [facility]) - CM/SW Discharge Note   Patient Details  Name: SHAIL URBAS MRN: [id number] Date of Birth: 1950-12-17  [facility] CM/SW Contact:  Candie Chroman, LCSW Phone Number: 03/19/2019, 12:27 PM   Clinical Narrative:  Patient has orders to discharge to [facility] today. They are ready to accept him once COVID results are back later tonight. RN will call report to [phone] Room 602 prior to calling EMS. CSW signing off.   Final next level of care: Skilled Nursing Facility Barriers to Discharge: Barriers Resolved   Patient Goals and CMS Choice     Choice offered to / list presented to : Patient  Discharge Placement              Patient chooses bed at: [facility] Patient to be transferred to facility by: EMS   Patient and family notified of of transfer: 03/19/19  Discharge Plan and Services                                     Social Determinants of Health (SDOH) Interventions     Readmission Risk Interventions Readmission Risk Prevention Plan 03/11/2019  Transportation Screening Complete  Social Work Consult for [place] Planning/Counseling Complete  Palliative Care Screening Not Applicable  Medication Review Press photographer) Complete  Some recent data might be hidden

## 2019-03-19 NOTE — Progress Notes (Signed)
IV removed. Pt cleaned and ready to go. [facility] discharged to [facility] via EMS. Report called to [facility]. Spoke with Catering manager. Prescriptions and AVS given to transport personnel. Pt family notified of discharge.

## 2019-03-19 NOTE — Telephone Encounter (Signed)
Per Ewell Poe hospital follow up has been scheduled with [facility] from [facility].

## 2019-03-19 NOTE — Progress Notes (Signed)
[facility]  ROUNDING NOTE   Subjective:   Sister in law at bedside.   Patient states he is feeling better.  Creatinine stable.  Objective:  Vital signs in last 24 hours:  Temp:  [97.8 F (36.6 C)-99.4 F (37.4 C)] 99.4 F (37.4 C) (08/18 1142) Pulse Rate:  [102-110] 102 (08/18 1142) Resp:  [15-20] 15 (08/18 1142) BP: (109-127)/(61-70) 115/67 (08/18 1142) SpO2:  [98 %-100 %] 99 % (08/18 1142)  Weight change:  Filed Weights   03/05/19 1730 03/06/19 1545 03/06/19 1549  Weight: 132.1 kg 129.9 kg 129.9 kg    Intake/Output: I/O last 3 completed shifts: In: 687.6 [P.O.:360; IV Piggyback:327.6] Out: 1750 [Urine:1750]   Intake/Output this shift:  No intake/output data recorded.  Physical Exam: General: NAD,   Head: Normocephalic, atraumatic. Moist oral mucosal membranes  Eyes: Anicteric, PERRL  Neck: Supple, trachea midline  Lungs:  Clear to auscultation  Heart: Regular rate and rhythm  Abdomen:  Soft, nontender,   Extremities: no peripheral edema.  Neurologic: Nonfocal, moving all four extremities  Skin: No lesions        Basic Metabolic Panel: Recent Labs  Lab 03/14/19 0554 03/15/19 0828 03/16/19 0526 03/18/19 0430 03/19/19 0631  NA 134* 133* 137 135 134*  K 3.4* 3.2* 3.3* 3.1* 3.3*  CL 97* 99 103 92* 99  CO2 25 21* 24 25 23   GLUCOSE 201* 166* 185* 168* 123*  BUN 121* 102* 89* 60* 51*  CREATININE 3.32* 2.39* 1.98* 1.74* 1.73*  CALCIUM 7.7* 7.8* 7.7* 7.9* 8.0*  MG 2.9*  --   --   --   --     Liver Function Tests: No results for input(s): AST, ALT, ALKPHOS, BILITOT, PROT, ALBUMIN in the last 168 hours. No results for input(s): LIPASE, AMYLASE in the last 168 hours. No results for input(s): AMMONIA in the last 168 hours.  CBC: Recent Labs  Lab 03/13/19 1003 03/14/19 0554 03/15/19 0828 03/16/19 0526 03/18/19 0430  WBC 31.7* 25.3* 23.5* 24.2* 20.6*  HGB 10.7* 10.4* 10.4* 10.1* 9.5*  HCT 33.0* 32.9* 33.0* 32.0* 30.1*  MCV 83.8 85.7  85.7 87.0 87.2  PLT 213 214 226 228 202    Cardiac Enzymes: No results for input(s): CKTOTAL, CKMB, CKMBINDEX, TROPONINI in the last 168 hours.  BNP: Invalid input(s): POCBNP  CBG: Recent Labs  Lab 03/18/19 1134 03/18/19 1637 03/18/19 2045 03/19/19 0733 03/19/19 1134  GLUCAP 194* 138* 167* 104* 144*    Microbiology: Results for orders placed or performed during the hospital encounter of 03/04/19  Urine culture     Status: Abnormal   Collection Time: 03/04/19  2:14 PM   Specimen: Urine, Clean Catch  Result Value Ref Range Status   Specimen Description   Final    URINE, CLEAN CATCH Performed at [facility], [address], [place], [place] [address]    Special Requests   Final    NONE Performed at [facility], [address] [place], [place] [address]    Culture (A)  Final    <10,000 COLONIES/mL INSIGNIFICANT GROWTH Performed at [facility], [place] [address], [place],  [address]    Report Status 03/06/2019 FINAL  Final  SARS Coronavirus 2 [facility] order, Performed in [facility] hospital lab) Nasopharyngeal Nasopharyngeal Swab     Status: None   Collection Time: 03/04/19  2:17 PM   Specimen: Nasopharyngeal Swab  Result Value Ref Range Status   SARS Coronavirus 2 NEGATIVE NEGATIVE Final  Comment: (NOTE) If result is NEGATIVE SARS-CoV-2 target nucleic acids are NOT DETECTED. The SARS-CoV-2 RNA is generally detectable in upper and lower  respiratory specimens during the acute phase of infection. The lowest  concentration of SARS-CoV-2 viral copies this assay can detect is 250  copies / mL. A negative result does not preclude SARS-CoV-2 infection  and should not be used as the sole basis for treatment or other  patient management decisions.  A negative result may occur with  improper specimen collection / handling, submission of specimen other  than nasopharyngeal swab, presence of viral mutation(s) within the  areas targeted by  this assay, and inadequate number of viral copies  (<250 copies / mL). A negative result must be combined with clinical  observations, patient history, and epidemiological information. If result is POSITIVE SARS-CoV-2 target nucleic acids are DETECTED. The SARS-CoV-2 RNA is generally detectable in upper and lower  respiratory specimens dur ing the acute phase of infection.  Positive  results are indicative of active infection with SARS-CoV-2.  Clinical  correlation with patient history and other diagnostic information is  necessary to determine patient infection status.  Positive results do  not rule out bacterial infection or co-infection with other viruses. If result is PRESUMPTIVE POSTIVE SARS-CoV-2 nucleic acids MAY BE PRESENT.   A presumptive positive result was obtained on the submitted specimen  and confirmed on repeat testing.  While 2019 novel coronavirus  (SARS-CoV-2) nucleic acids may be present in the submitted sample  additional confirmatory testing may be necessary for epidemiological  and / or clinical management purposes  to differentiate between  SARS-CoV-2 and other Sarbecovirus currently known to infect humans.  If clinically indicated additional testing with an alternate test  methodology [phone]) is advised. The SARS-CoV-2 RNA is generally  detectable in upper and lower respiratory sp ecimens during the acute  phase of infection. The expected result is Negative. Fact Sheet for Patients:  StrictlyIdeas.no Fact Sheet for Healthcare Providers: BankingDealers.co.za This test is not yet approved or cleared by the [place] FDA and has been authorized for detection and/or diagnosis of SARS-CoV-2 by FDA under an Emergency Use Authorization (EUA).  This EUA will remain in effect (meaning this test can be used) for the duration of the COVID-19 declaration under Section 564(b)(1) of the Act, 21 U.S.C. section  360bbb-3(b)(1), unless the authorization is terminated or revoked sooner. Performed at [facility], [address] [place], [place] [address]   Blood Culture (routine x 2)     Status: None   Collection Time: 03/04/19  2:55 PM   Specimen: BLOOD  Result Value Ref Range Status   Specimen Description BLOOD BLOOD RIGHT HAND  Final   Special Requests   Final    BOTTLES DRAWN AEROBIC AND ANAEROBIC Blood Culture adequate volume   Culture   Final    NO GROWTH 5 DAYS Performed at [facility], [address] [place],  [address]    Report Status 03/09/2019 FINAL  Final  MRSA PCR Screening     Status: None   Collection Time: 03/04/19  8:36 PM   Specimen: Nasopharyngeal  Result Value Ref Range Status   MRSA by PCR NEGATIVE NEGATIVE Final    Comment:        The GeneXpert MRSA Assay (FDA approved for NASAL specimens only), is one component of a comprehensive MRSA colonization surveillance program. It is not intended to diagnose MRSA infection nor to guide or monitor treatment for MRSA infections. Performed  at [facility], [address] [place], [place] [address]   Blood Culture (routine x 2)     Status: None   Collection Time: 03/04/19 10:01 PM   Specimen: BLOOD  Result Value Ref Range Status   Specimen Description BLOOD RIGHT ASSIST CONTROL  Final   Special Requests   Final    BOTTLES DRAWN AEROBIC AND ANAEROBIC Blood Culture adequate volume   Culture   Final    NO GROWTH 5 DAYS Performed at [facility], [address], [place], [place] [address]    Report Status 03/09/2019 FINAL  Final  Body fluid culture     Status: None   Collection Time: 03/05/19 11:21 AM   Specimen: [facility] Cytology Pleural fluid  Result Value Ref Range Status   Specimen Description   Final    PLEURAL Performed at [facility], [address] [place], [place] [address]    Special Requests   Final    NONE Performed at [facility],  [address], [place], [place] [address]    Gram Stain   Final    FEW WBC PRESENT, PREDOMINANTLY PMN ABUNDANT GRAM NEGATIVE RODS ABUNDANT GRAM POSITIVE COCCI FEW GRAM VARIABLE ROD    Culture   Final    MODERATE VIRIDANS STREPTOCOCCUS MODERATE PREVOTELLA BUCCAE FEW PREVOTELLA INTERMEDIA BETA LACTAMASE POSITIVE Performed at [facility], [place] [address] [place], [place] [address]    Report Status 03/11/2019 FINAL  Final  Gastrointestinal Panel by PCR , Stool     Status: None   Collection Time: 03/16/19  9:47 PM   Specimen: STOOL  Result Value Ref Range Status   Campylobacter species NOT DETECTED NOT DETECTED Final   Plesimonas shigelloides NOT DETECTED NOT DETECTED Final   Salmonella species NOT DETECTED NOT DETECTED Final   Yersinia enterocolitica NOT DETECTED NOT DETECTED Final   Vibrio species NOT DETECTED NOT DETECTED Final   Vibrio cholerae NOT DETECTED NOT DETECTED Final   Enteroaggregative E coli (EAEC) NOT DETECTED NOT DETECTED Final   Enteropathogenic E coli (EPEC) NOT DETECTED NOT DETECTED Final   Enterotoxigenic E coli (ETEC) NOT DETECTED NOT DETECTED Final   Shiga like toxin producing E coli (STEC) NOT DETECTED NOT DETECTED Final   Shigella/Enteroinvasive E coli (EIEC) NOT DETECTED NOT DETECTED Final   Cryptosporidium NOT DETECTED NOT DETECTED Final   Cyclospora cayetanensis NOT DETECTED NOT DETECTED Final   Entamoeba histolytica NOT DETECTED NOT DETECTED Final   Giardia lamblia NOT DETECTED NOT DETECTED Final   Adenovirus F40/41 NOT DETECTED NOT DETECTED Final   Astrovirus NOT DETECTED NOT DETECTED Final   Norovirus GI/GII NOT DETECTED NOT DETECTED Final   Rotavirus A NOT DETECTED NOT DETECTED Final   Sapovirus (I, II, IV, and V) NOT DETECTED NOT DETECTED Final    Comment: Performed at [facility], [address] [place], [place] [address]  C Difficile Quick Screen w PCR reflex     Status: None   Collection Time: 03/16/19  9:47 PM   Specimen:  STOOL  Result Value Ref Range Status   C Diff antigen NEGATIVE NEGATIVE Final   C Diff toxin NEGATIVE NEGATIVE Final   C Diff interpretation No C. difficile detected.  Final    Comment: Performed at [facility], [address] [place], [place] [address]    Coagulation Studies: No results for input(s): LABPROT, INR in the last 72 hours.  Urinalysis: Recent Labs    03/18/19 0215  COLORURINE YELLOW*  LABSPEC 1.019  PHURINE 6.0  GLUCOSEU 50*  HGBUR SMALL*  BILIRUBINUR NEGATIVE  KETONESUR NEGATIVE  PROTEINUR 100*  NITRITE NEGATIVE  LEUKOCYTESUR SMALL*      Imaging: Dg Chest Port 1 View  Result Date: 03/18/2019 CLINICAL DATA:  Chest tube removal yesterday EXAM: PORTABLE CHEST 1 VIEW COMPARISON:  Chest radiograph from one day prior. 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 Stable cardiomediastinal silhouette with mild cardiomegaly. No appreciable pneumothorax. Small bilateral pleural effusions appear similar. Mild patchy bibasilar opacity, similar. No overt pulmonary edema. IMPRESSION: 1. No appreciable residual pneumothorax. 2. Similar mild patchy bibasilar lung opacities, favor atelectasis. Electronically Signed   By: Ilona Sorrel M.D.   On: 03/18/2019 08:11     Medications:   . sodium chloride 250 mL (03/17/19 2322)   . amiodarone  200 mg Oral Daily  . amoxicillin-clavulanate  1 tablet Oral Q12H  . aspirin EC  81 mg Oral Daily  . atorvastatin  80 mg Oral Daily  . feeding supplement (NEPRO CARB STEADY)  237 mL Oral BID BM  . finasteride  5 mg Oral Daily  . insulin aspart  0-9 Units Subcutaneous TID AC & HS  . insulin aspart  5 Units Subcutaneous TID WC  . insulin detemir  13 Units Subcutaneous Daily  . lactobacillus  1 g Oral TID WC  . metoprolol succinate  25 mg Oral Daily  . niacin  1,000 mg Oral QHS  . pantoprazole  40 mg Oral Daily  . potassium chloride  20 mEq Oral Daily  . rivaroxaban  20 mg Oral Daily  . tamsulosin  0.4 mg  Oral QPC breakfast   sodium chloride, acetaminophen **OR** acetaminophen, albuterol, loperamide, morphine injection, nitroGLYCERIN, polyethylene glycol  Assessment/ Plan:  [name] is a 68 y.o. white male with diabetes mellitus type II, hypertension, obstructive sleep apnea, congestive heart failure, atrial fibrillation, hyperlipidemia, Crohn's disease, coronary artery disease, GERD, pacemaker,who was admitted to [facility] on acute exacerbation of COPD, acute exacerbation of congestive heart failure, right pleural effusion status post chest tube placement, acute respiratory failure requiring BIPAP, sepsis with pneumonia and acute renal failure requiring hemodialysis   1. Acute renal failure: baseline creatinine of 1.1, normal GFR on 01/02/2019.  with underlying diabetic nephropathy with glucosuria.  Nonoliguric urine output. No indication for dialysis. Creatinine improving.  Serologic work up negative so far.   2. Acute respiratory failure:  chest tube from 8/5 to 8/16.  Secondary to pneumonia, congestive heart failure and COPD exacerbation  3. Acute exacerbation of systolic congestive heart failure:  7/10 echocardiogram 35-40% Currently holding diuretics.    4. Hypertension with atrial fibrillation - metoprolol.   5. Hypokalemia -  potassium PO   LOS: 15 Anyla Israelson 8/18/20201:05 PM

## 2019-03-20 ENCOUNTER — Ambulatory Visit: Payer: Medicare Other | Admitting: Cardiovascular Disease

## 2019-03-27 ENCOUNTER — Ambulatory Visit: Payer: Medicare Other | Admitting: Family

## 2019-04-01 ENCOUNTER — Telehealth: Payer: Self-pay

## 2019-04-01 NOTE — Progress Notes (Signed)
* [facility]   Virtual Visit via Telephone Note I connected with patient on 04/02/19 at 10:00 AM EDT by telephone and verified that I am speaking with the correct person using two identifiers.   I discussed the limitations, risks of performing an evaluation and management service by telephone and the availability of in person appointments. I also discussed with the patient that there may be a patient responsible charge related to this service.  In light of current covid-19 pandemic, patient also understands that we are trying to protect them by minimizing in office contact if at all possible.  The patient expressed understanding and agreed to proceed. Please see note below for further detail.    The patient was advised to call back or seek an in-person evaluation if the symptoms worsen or if the condition fails to improve as anticipated. I provided 15 minutes of non face-to-face time during this encounter.    Laverle Hobby, MD    Assessment and Plan:  S/p Empyema.Strep viridans pneumonia. Debility, deconditioning.  -Appears to be doing well, no fever, no significant drainage noted from chest tube site.  --continue PT. --Follow up in 3 months with Chest x ray.     Date: 04/02/2019  MRN# [id number] Colton Lamb 05-21-1951   Colton Lamb is a 68 y.o. old male seen in follow up for chief complaint of dyspnea.     HPI:  Colton Lamb is a 68 y.o. male with a history ofSystolic HF with EF 81% (0/17/51), HTN, PAF (on xarelto), sleep apnea, ischemic cardiomyopathy, CAD, obesity, HLD, GERD presented to ED via EMS on CPAP with AMS.  He was admitted to the hospital in August 2020 for a severe right-sided pneumonia and empyema, status post chest tube with instillation of TPA and DNase. History is taken from the patient as well as his RN at [facility].  He is currently at [facility], RN notes that his breathing has significantly improved. He has prn  albuterol but he has not asked to use it. He remains on xarelto.  He feels that his breathing has improved since being discharged. He has been limited by leg weakness, is still using a wheel chair, he is not limited by breathing and is doing physical therapy.  He denies any pain in the right side of his chest. He initially had leakage from the site, which is now scant.  Rn notes that site is not inflammed or red, no foul smelling discharge from the site. He is not on abx.     Medication:    Current Outpatient Medications:  .  acetaminophen (TYLENOL) 325 MG tablet, Take 1-2 tablets (325-650 mg total) by mouth every 4 (four) hours as needed for mild pain., Disp: , Rfl:  .  albuterol (PROVENTIL) (2.5 MG/3ML) 0.083% nebulizer solution, Take 3 mLs (2.5 mg total) by nebulization every 6 (six) hours as needed for wheezing., Disp: 75 mL, Rfl: 12 .  amiodarone (PACERONE) 200 MG tablet, Take 1 tablet (200 mg total) by mouth daily., Disp: 30 tablet, Rfl: 0 .  aspirin 81 MG tablet, Take 81 mg by mouth daily. , Disp: , Rfl:  .  atorvastatin (LIPITOR) 80 MG tablet, TAKE 1 TABLET BY MOUTH EVERY DAY, Disp: 90 tablet, Rfl: 1 .  balsalazide (COLAZAL) 750 MG capsule, TAKE 1 CAPSULE (750 MG TOTAL) BY MOUTH 3 (THREE) TIMES DAILY., Disp: 270 capsule, Rfl: 3 .  finasteride (PROSCAR) 5 MG tablet, Take 1 tablet (5 mg total)  by mouth daily., Disp: 30 tablet, Rfl: 0 .  insulin aspart (NOVOLOG) 100 UNIT/ML injection, Inject 5 Units into the skin 3 (three) times daily with meals., Disp: 10 mL, Rfl: 11 .  insulin detemir (LEVEMIR) 100 UNIT/ML injection, Inject 0.13 mLs (13 Units total) into the skin daily., Disp: 10 mL, Rfl: 11 .  lactobacillus (FLORANEX/LACTINEX) PACK, Take 1 packet (1 g total) by mouth 3 (three) times daily with meals., Disp: 90 each, Rfl: 0 .  metoprolol succinate (TOPROL-XL) 25 MG 24 hr tablet, Take 1 tablet (25 mg total) by mouth daily., Disp: 30 tablet, Rfl: 0 .  nitroGLYCERIN (NITROSTAT) 0.4 MG SL  tablet, Place 0.4 mg under the tongue every 5 (five) minutes as needed (MAX 3 TABLETS). FOR CHEST PAIN, Disp: , Rfl:  .  Nutritional Supplements (FEEDING SUPPLEMENT, NEPRO CARB STEADY,) LIQD, Take 237 mLs by mouth 2 (two) times daily between meals., Disp: 14220 mL, Rfl: 0 .  omeprazole (PRILOSEC) 20 MG capsule, Take 20 mg by mouth daily. , Disp: , Rfl:  .  potassium chloride SA (K-DUR) 20 MEQ tablet, Take 1 tablet (20 mEq total) by mouth daily., Disp: 30 tablet, Rfl: 0 .  tamsulosin (FLOMAX) 0.4 MG CAPS capsule, Take 1 capsule (0.4 mg total) by mouth daily after breakfast., Disp: 30 capsule, Rfl: 0 .  XARELTO 20 MG TABS tablet, TAKE 1 TABLET BY MOUTH EVERY DAY WITH LUNCH, Disp: 90 tablet, Rfl: 1   Allergies:  Iodine, Shrimp [shellfish allergy], and Tetracycline   Review of Systems:  Constitutional: Feels well. Cardiovascular: Denies chest pain, exertional chest pain.  Pulmonary: Denies hemoptysis, pleuritic chest pain.   The remainder of systems were reviewed and were found to be negative other than what is documented in the HPI.       LABORATORY PANEL:   CBC No results for input(s): WBC, HGB, HCT, PLT in the last 168 hours. ------------------------------------------------------------------------------------------------------------------  Chemistries  No results for input(s): NA, K, CL, CO2, GLUCOSE, BUN, CREATININE, CALCIUM, MG, AST, ALT, ALKPHOS, BILITOT in the last 168 hours.  Invalid input(s): GFRCGP ------------------------------------------------------------------------------------------------------------------  Cardiac Enzymes No results for input(s): TROPONINI in the last 168 hours. ------------------------------------------------------------  RADIOLOGY:   No results found for this or any previous visit. Results for orders placed during the hospital encounter of 12/10/17  DG Chest 2 View   Narrative CLINICAL DATA:  Shortness of breath  EXAM: CHEST - 2 VIEW   COMPARISON:  09/08/2016  FINDINGS: Cardiac shadow is enlarged. Defibrillator is again seen and stable. Diffuse vascular congestion is identified without significant interstitial edema. Some right basilar atelectasis is noted as well.  IMPRESSION: Mild CHF with right basilar atelectasis.   Electronically Signed   By: Inez Catalina M.D.   On: 12/10/2017 19:25    ------------------------------------------------------------------------------------------------------------------  Thank  you for allowing [facility] to assist in the care of your patient. Our recommendations are noted above.  Please contact us if we can be of further service.   Marda Stalker, M.D., F.C.C.P.  Board Certified in Internal Medicine, Pulmonary Medicine, [place], and Sleep Medicine.  [facility] Office Number: [phone]  04/02/2019

## 2019-04-01 NOTE — Telephone Encounter (Signed)
TELEPHONE CALL NOTE  Colton Lamb has been deemed a candidate for a follow-up tele-health visit to limit community exposure during the Covid-19 pandemic. I spoke with the patient via phone to ensure availability of phone/video source, confirm preferred email & phone number, discuss instructions and expectations, and review consent.   I reminded Colton Lamb to be prepared with any vital sign and/or heart rhythm information that could potentially be obtained via home monitoring, at the time of his visit.  Finally, I reminded Colton Lamb to expect an e-mail containing a link for their video-based visit approximately 15 minutes before his visit, or alternatively, a phone call at the time of his visit if his visit is planned to be a phone encounter.  Did the patient verbally consent to treatment as below? YES ( Consent given by [facility])  ALLRED, AMANDA L, CMA 04/01/2019 11:24 AM  CONSENT FOR TELE-HEALTH VISIT - PLEASE REVIEW  I hereby voluntarily request, consent and authorize [facility] and its employed or contracted physicians, physician assistants, nurse practitioners or other licensed health care professionals (the Practitioner), to provide me with telemedicine health care services (the "Services") as deemed necessary by the treating Practitioner. I acknowledge and consent to receive the Services by the Practitioner via telemedicine. I understand that the telemedicine visit will involve communicating with the Practitioner through telephonic communication technology and the disclosure of certain medical information by electronic transmission. I acknowledge that I have been given the opportunity to request an in-person assessment or other available alternative prior to the telemedicine visit and am voluntarily participating in the telemedicine visit.  I understand that I have the right to withhold or withdraw my consent to the use of telemedicine in the course of my  care at any time, without affecting my right to future care or treatment, and that the Practitioner or I may terminate the telemedicine visit at any time. I understand that I have the right to inspect all information obtained and/or recorded in the course of the telemedicine visit and may receive copies of available information for a reasonable fee.  I understand that some of the potential risks of receiving the Services via telemedicine include:  Marland Kitchen Delay or interruption in medical evaluation due to technological equipment failure or disruption; . Information transmitted may not be sufficient (e.g. poor resolution of images) to allow for appropriate medical decision making by the Practitioner; and/or  . In rare instances, security protocols could fail, causing a breach of personal health information.  Furthermore, I acknowledge that it is my responsibility to provide information about my medical history, conditions and care that is complete and accurate to the best of my ability. I acknowledge that Practitioner's advice, recommendations, and/or decision may be based on factors not within their control, such as incomplete or inaccurate data provided by me or lack of visual representation. I understand that the practice of medicine is not an exact science and that Practitioner makes no warranties or guarantees regarding treatment outcomes. I acknowledge that I will receive a copy of this consent concurrently upon execution via email to the email address I last provided but may also request a printed copy by calling the office of [facility].    I understand that my insurance may be billed for this visit.   I have read or had this consent read to me. . I understand the contents of this consent, which adequately explains the benefits and risks of the Services  being provided via telemedicine.  . I have been provided ample opportunity to ask questions regarding this consent and the Services and  have had my questions answered to my satisfaction. . I give my informed consent for the services to be provided through the use of telemedicine in my medical care  By participating in this telemedicine visit I agree to the above.

## 2019-04-02 ENCOUNTER — Encounter: Payer: Self-pay | Admitting: Family

## 2019-04-02 ENCOUNTER — Ambulatory Visit: Payer: Medicare Other | Attending: Family | Admitting: Family

## 2019-04-02 ENCOUNTER — Ambulatory Visit (INDEPENDENT_AMBULATORY_CARE_PROVIDER_SITE_OTHER): Payer: Medicare Other | Admitting: Internal Medicine

## 2019-04-02 ENCOUNTER — Other Ambulatory Visit: Payer: Self-pay

## 2019-04-02 VITALS — Wt 270.0 lb

## 2019-04-02 DIAGNOSIS — I5022 Chronic systolic (congestive) heart failure: Secondary | ICD-10-CM

## 2019-04-02 DIAGNOSIS — I10 Essential (primary) hypertension: Secondary | ICD-10-CM

## 2019-04-02 DIAGNOSIS — I255 Ischemic cardiomyopathy: Secondary | ICD-10-CM

## 2019-04-02 DIAGNOSIS — J181 Lobar pneumonia, unspecified organism: Secondary | ICD-10-CM

## 2019-04-02 DIAGNOSIS — J869 Pyothorax without fistula: Secondary | ICD-10-CM | POA: Diagnosis not present

## 2019-04-02 DIAGNOSIS — E1159 Type 2 diabetes mellitus with other circulatory complications: Secondary | ICD-10-CM

## 2019-04-02 NOTE — Patient Instructions (Signed)
Continue to use rescue inhaler as needed.  Will get a chest xray in 3 months and see you back at that time.  [place] back sooner if you notice trouble breathing, fever, or discharge/pain from the area where the chest tube was.

## 2019-04-02 NOTE — Patient Instructions (Signed)
Continue weighing daily and call for an overnight weight gain of > 2 pounds or a weekly weight gain of >5 pounds. 

## 2019-04-02 NOTE — Progress Notes (Signed)
Virtual Visit via Telephone Note   Evaluation Performed:  Initial visit  This visit type was conducted due to national recommendations for restrictions regarding the COVID-19 Pandemic (e.g. social distancing).  This format is felt to be most appropriate for this patient at this time.  All issues noted in this document were discussed and addressed.  No physical exam was performed (except for noted visual exam findings with Video Visits).  Please refer to the patient's chart (MyChart message for video visits and phone note for telephone visits) for the patient's consent to telehealth for [facility]  Date:  04/02/2019   ID:  Colton Lamb, DOB [birth year], MRN [id number]  Patient Location:  @ [facility]  Provider location:   [facility] [place] [address], [place] [address]  PCP:  Toni Arthurs, NP  Cardiologist:  Ida Rogue, MD  Electrophysiologist:  None   Chief Complaint:  Shortness of breath  History of Present Illness:    Colton Lamb is a 68 y.o. male who presents via audio/video conferencing for a telehealth visit today.  Employee at [employer] confirmed patient identity.   The patient does not have symptoms concerning for COVID-19 infection (fever, chills, cough, or new SHORTNESS OF BREATH).   Patient reports minimal shortness of breath upon moderate exertion. He describes this as chronic in nature having been present for several years. He has associated swelling in his legs and minimal fatigue along with this. He denies any dizziness, abdominal distention, palpitations, chest pain, cough, difficulty sleeping or weight gain.   Prior CV studies:   The following studies were reviewed today:  Echo report from 02/08/2019 reviewed and showed an EF of 35-40%.  Past Medical History:  Diagnosis Date  . Atrial flutter ([place])    a. s/p Cardioversion 11/22/13, on amiodarone and Xarelto.  . Chronic systolic heart failure ([place])    a. 10/2013 EF 20-25%, grade III DD, RV mildly dilated and sys fx mild/mod reduced;  b. 01/2014 Echo: EF 30-35%, gr3 DD, mod dil LA.  Marland Kitchen Coronary artery disease    a. s/p MI 2007/2015;  b. s/p prior PCI to the LAD/LCX/PDA/PL;  c. 2008: s/p Cypher DES to the OM.  Marland Kitchen Crohn's ileocolitis ([place])   . GERD (gastroesophageal reflux disease)   . Hx of adenomatous colonic polyps 11/2003  . Hyperlipidemia   . Hypertension   . Ischemic cardiomyopathy    s. 01/2014 s/p MDT [id number] [name] XT DR single lead AICD.  Marland Kitchen Obesity   . Paroxysmal atrial fibrillation ([facility])    a. CHA2DS2VASc = 4-->xarelto/amio.  . Sleep apnea   . Syncope    a.  11/2013 in setting of volume depletion and bradycardia due to dig toxicity   . Type II diabetes mellitus ([place])    Past Surgical History:  Procedure Laterality Date  . ATRIAL FLUTTER ABLATION N/A 04/16/2014   Procedure: ATRIAL FLUTTER ABLATION;  Surgeon: Evans Lance, MD;  Location: [facility] CATH LAB;  Service: Cardiovascular;  Laterality: N/A;  . CARDIAC CATHETERIZATION  10/2013  . CARDIAC DEFIBRILLATOR PLACEMENT  04/16/2014   Medtronic Evira device  . CARDIAC ELECTROPHYSIOLOGY STUDY AND ABLATION  04/16/2014   atrial flutter ablation  . CARDIOVERSION N/A 03/05/2014   Procedure: CARDIOVERSION;  Surgeon: Jolaine Artist, MD;  Location: [facility] ENDOSCOPY;  Service: Cardiovascular;  Laterality: N/A;  . CATARACT EXTRACTION W/PHACO Right 01/04/2017   Procedure: CATARACT EXTRACTION PHACO AND INTRAOCULAR LENS PLACEMENT (IOC)  Right Diabetic Complicated;  Surgeon: Leandrew Koyanagi, MD;  Location: [place];  Service: Ophthalmology;  Laterality: Right;  Diabetic  . CATARACT EXTRACTION W/PHACO Left 02/08/2017   Procedure: CATARACT EXTRACTION PHACO AND INTRAOCULAR LENS PLACEMENT ([place]) left diabetic;  Surgeon: Leandrew Koyanagi, MD;  Location: [place];  Service: Ophthalmology;  Laterality: Left;  Diabetic - oral meds sleep apnea  . CORONARY ANGIOPLASTY WITH STENT PLACEMENT   2007; 2008 X 2   "1+1 ~ 1"  . FOOT SURGERY Left    bone spur  . HYDROCELE EXCISION Bilateral   . 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 IMPLANTABLE CARDIOVERTER DEFIBRILLATOR IMPLANT N/A 04/16/2014   Procedure: IMPLANTABLE CARDIOVERTER DEFIBRILLATOR IMPLANT;  Surgeon: Evans Lance, MD;  Location: [facility] CATH LAB;  Service: Cardiovascular;  Laterality: N/A;  . LEFT HEART CATHETERIZATION WITH CORONARY ANGIOGRAM N/A 11/22/2013   Procedure: LEFT HEART CATHETERIZATION WITH CORONARY ANGIOGRAM;  Surgeon: Sinclair Grooms, MD;  Location: [facility] CATH LAB;  Service: Cardiovascular;  Laterality: N/A;     Prior to Admission medications   Medication Sig Start Date End Date Taking? Authorizing Provider  acetaminophen (TYLENOL) 325 MG tablet Take 1-2 tablets (325-650 mg total) by mouth every 4 (four) hours as needed for mild pain. 04/17/14  Yes [name], NP  Albuterol Sulfate 108 (90 Base) MCG/ACT AEPB Inhale 2 Inhalers into the lungs as needed.   Yes [provider]  amiodarone (PACERONE) 200 MG tablet Take 1 tablet (200 mg total) by mouth daily. 03/20/19  Yes Wieting, Richard, MD  aspirin 81 MG tablet Take 81 mg by mouth daily.    Yes [provider]  atorvastatin (LIPITOR) 80 MG tablet TAKE 1 TABLET BY MOUTH EVERY DAY 03/08/19  Yes Gollan, Kathlene November, MD  balsalazide (COLAZAL) 750 MG capsule TAKE 1 CAPSULE (750 MG TOTAL) BY MOUTH 3 (THREE) TIMES DAILY. 11/28/18  Yes Ladene Artist, MD  finasteride (PROSCAR) 5 MG tablet Take 1 tablet (5 mg total) by mouth daily. 03/20/19  Yes Wieting, Richard, MD  insulin aspart (NOVOLOG) 100 UNIT/ML injection Inject 5 Units into the skin 3 (three) times daily with meals. Patient taking differently: Inject 6 Units into the skin 3 (three) times daily with meals.  03/19/19  Yes Wieting, Richard, MD  insulin detemir (LEVEMIR) 100 UNIT/ML injection Inject 0.13 mLs (13 Units total) into the skin daily. Patient taking differently:  Inject 18 Units into the skin daily.  03/19/19  Yes Wieting, Richard, MD  lactobacillus (FLORANEX/LACTINEX) PACK Take 1 packet (1 g total) by mouth 3 (three) times daily with meals. 03/19/19  Yes Wieting, Richard, MD  metoprolol succinate (TOPROL-XL) 25 MG 24 hr tablet Take 1 tablet (25 mg total) by mouth daily. 03/19/19  Yes Wieting, Richard, MD  Multiple Vitamins-Minerals (MULTIVITAMIN WITH MINERALS) tablet Take 1 tablet by mouth daily.   Yes [provider]  nitroGLYCERIN (NITROSTAT) 0.4 MG SL tablet Place 0.4 mg under the tongue every 5 (five) minutes as needed (MAX 3 TABLETS). FOR CHEST PAIN   Yes [provider]  Nutritional Supplements (FEEDING SUPPLEMENT, NEPRO CARB STEADY,) LIQD Take 237 mLs by mouth 2 (two) times daily between meals. 03/19/19  Yes Wieting, Richard, MD  omeprazole (PRILOSEC) 20 MG capsule Take 20 mg by mouth daily.    Yes [provider]  potassium chloride SA (K-DUR) 20 MEQ tablet Take 1 tablet (20 mEq total) by mouth daily. 03/20/19  Yes Wieting, Richard, MD  tamsulosin (FLOMAX) 0.4 MG CAPS capsule Take  1 capsule (0.4 mg total) by mouth daily after breakfast. 03/20/19  Yes Wieting, Richard, MD  vitamin C (ASCORBIC ACID) 500 MG tablet Take 500 mg by mouth 2 (two) times daily.   Yes [provider]  XARELTO 20 MG TABS tablet TAKE 1 TABLET BY MOUTH EVERY DAY WITH LUNCH 11/23/18  Yes Gollan, Kathlene November, MD  zinc sulfate 220 (50 Zn) MG capsule Take 220 mg by mouth daily.   Yes [provider]     Allergies:   Iodine, Shrimp [shellfish allergy], and Tetracycline   Social History   Tobacco Use  . Smoking status: Never Smoker  . Smokeless tobacco: Never Used  Substance Use Topics  . Alcohol use: No  . Drug use: No     Family Hx: The patient's family history includes Breast cancer in his mother; Heart attack in his father; Heart disease in his father. There is no history of Colon cancer.  ROS:   Please see the history of present  illness.     All other systems reviewed and are negative.   Labs/Other Tests and Data Reviewed:    Recent Labs: 03/04/2019: B Natriuretic Peptide 972.0 03/10/2019: ALT 27 03/14/2019: Magnesium 2.9 03/18/2019: Hemoglobin 9.5; Platelets 202 03/19/2019: BUN 51; Creatinine, Ser 1.73; Potassium 3.3; Sodium 134   Recent Lipid Panel Lab Results  Component Value Date/Time   CHOL 112 05/09/2016 08:30 AM   TRIG 100 05/09/2016 08:30 AM   HDL 35 (L) 05/09/2016 08:30 AM   CHOLHDL 3.2 11/23/2013 02:00 AM   LDLCALC 57 05/09/2016 08:30 AM    Wt Readings from Last 3 Encounters:  03/06/19 286 lb 6 oz (129.9 kg)  01/14/19 285 lb (129.3 kg)  11/28/18 276 lb (125.2 kg)     Exam:    Vital Signs:  Weight has declined from 286.3 pounds at facility admission, down to 270 pounds today (2 weeks later)  Well nourished, well developed male in no  acute distress.   ASSESSMENT & PLAN:    1. Chronic heart failure with reduced ejection fraction- - NYHA class II - euvolemic today based on patient's description of symptoms - being weighed "almost daily" with a hoyer lift as he is currently in the quarantine unit at [facility]; instructed to call for an overnight weight gain of >2 pounds or a weekly weight gain of >5 pounds - not adding salt to his food - drinking water during the day - had telemedicine visit with cardiology ([place]) 01/14/2019 - could consider adding entresto if renal function allows and/or titrating up metoprolol succinate - may also benefit from [place] - BNP 03/04/2019 was 972.0 - has ICD present  2: HTN- - seeing PCP at the facility right now - BMP from 03/19/2019 reviewed and showed sodium 134, potassium 3.3, creatinine 1.73 and GFR 40 - facility is going to check and see when patient sees nephrology ([place])  3: DM-  - facility says that his glucose this morning was 128 - A1c 03/05/2019 was 6.8% - facility says that 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  The importance of social distancing was discussed today.  Patient Risk:   After full review of this patients clinical status, I feel that they are at least moderate risk at this time.  Time:   Today, I  have spent 11 minutes with the patient& staff with telehealth technology discussing medications and symptoms.      Medication Adjustments/Labs and Tests Ordered: Current medicines are reviewed at length with the patient today.  Concerns regarding medicines are outlined above.   Tests Ordered: No orders of the defined types were placed in this encounter.  Medication Changes: No orders of the defined types were placed in this encounter.   Disposition:  Follow-up in 6 weeks or sooner for any questions/problems before then.   Signed, Alisa Graff, FNP  04/02/2019 11:17 AM    [facility]

## 2019-04-09 ENCOUNTER — Other Ambulatory Visit: Payer: Self-pay

## 2019-04-09 MED ORDER — POTASSIUM CHLORIDE CRYS ER 20 MEQ PO TBCR: 20.0000 meq | EXTENDED_RELEASE_TABLET | Freq: Every day | ORAL | 3 refills | Status: DC

## 2019-04-21 ENCOUNTER — Encounter: Payer: Self-pay | Admitting: Emergency Medicine

## 2019-04-21 ENCOUNTER — Emergency Department: Payer: Medicare Other

## 2019-04-21 ENCOUNTER — Inpatient Hospital Stay
Admission: EM | Admit: 2019-04-21 | Discharge: 2019-04-26 | DRG: 853 | Disposition: A | Discharge status: Skilled Nursing Facility | Payer: Medicare Other | Source: Skilled Nursing Facility | Attending: Internal Medicine | Admitting: Internal Medicine

## 2019-04-21 ENCOUNTER — Other Ambulatory Visit: Payer: Self-pay

## 2019-04-21 DIAGNOSIS — N170 Acute kidney failure with tubular necrosis: Secondary | ICD-10-CM | POA: Diagnosis present

## 2019-04-21 DIAGNOSIS — D62 Acute posthemorrhagic anemia: Secondary | ICD-10-CM | POA: Diagnosis present

## 2019-04-21 DIAGNOSIS — I5042 Chronic combined systolic (congestive) and diastolic (congestive) heart failure: Secondary | ICD-10-CM | POA: Diagnosis present

## 2019-04-21 DIAGNOSIS — Z7982 Long term (current) use of aspirin: Secondary | ICD-10-CM

## 2019-04-21 DIAGNOSIS — A419 Sepsis, unspecified organism: Principal | ICD-10-CM | POA: Diagnosis present

## 2019-04-21 DIAGNOSIS — R6521 Severe sepsis with septic shock: Secondary | ICD-10-CM | POA: Diagnosis present

## 2019-04-21 DIAGNOSIS — G2581 Restless legs syndrome: Secondary | ICD-10-CM | POA: Diagnosis present

## 2019-04-21 DIAGNOSIS — K219 Gastro-esophageal reflux disease without esophagitis: Secondary | ICD-10-CM | POA: Diagnosis present

## 2019-04-21 DIAGNOSIS — E872 Acidosis: Secondary | ICD-10-CM | POA: Diagnosis present

## 2019-04-21 DIAGNOSIS — L89154 Pressure ulcer of sacral region, stage 4: Secondary | ICD-10-CM | POA: Diagnosis present

## 2019-04-21 DIAGNOSIS — I2510 Atherosclerotic heart disease of native coronary artery without angina pectoris: Secondary | ICD-10-CM | POA: Diagnosis present

## 2019-04-21 DIAGNOSIS — Z794 Long term (current) use of insulin: Secondary | ICD-10-CM

## 2019-04-21 DIAGNOSIS — Z955 Presence of coronary angioplasty implant and graft: Secondary | ICD-10-CM | POA: Diagnosis not present

## 2019-04-21 DIAGNOSIS — Z6837 Body mass index (BMI) 37.0-37.9, adult: Secondary | ICD-10-CM

## 2019-04-21 DIAGNOSIS — R509 Fever, unspecified: Secondary | ICD-10-CM | POA: Diagnosis present

## 2019-04-21 DIAGNOSIS — Z23 Encounter for immunization: Secondary | ICD-10-CM | POA: Diagnosis present

## 2019-04-21 DIAGNOSIS — J189 Pneumonia, unspecified organism: Secondary | ICD-10-CM | POA: Diagnosis present

## 2019-04-21 DIAGNOSIS — I110 Hypertensive heart disease with heart failure: Secondary | ICD-10-CM | POA: Diagnosis present

## 2019-04-21 DIAGNOSIS — Z91013 Allergy to seafood: Secondary | ICD-10-CM | POA: Diagnosis not present

## 2019-04-21 DIAGNOSIS — I480 Paroxysmal atrial fibrillation: Secondary | ICD-10-CM | POA: Diagnosis present

## 2019-04-21 DIAGNOSIS — D638 Anemia in other chronic diseases classified elsewhere: Secondary | ICD-10-CM | POA: Diagnosis present

## 2019-04-21 DIAGNOSIS — Z79899 Other long term (current) drug therapy: Secondary | ICD-10-CM

## 2019-04-21 DIAGNOSIS — E785 Hyperlipidemia, unspecified: Secondary | ICD-10-CM | POA: Diagnosis present

## 2019-04-21 DIAGNOSIS — L84 Corns and callosities: Secondary | ICD-10-CM | POA: Diagnosis present

## 2019-04-21 DIAGNOSIS — Z7901 Long term (current) use of anticoagulants: Secondary | ICD-10-CM

## 2019-04-21 DIAGNOSIS — R339 Retention of urine, unspecified: Secondary | ICD-10-CM | POA: Diagnosis present

## 2019-04-21 DIAGNOSIS — Z515 Encounter for palliative care: Secondary | ICD-10-CM | POA: Diagnosis not present

## 2019-04-21 DIAGNOSIS — Z7189 Other specified counseling: Secondary | ICD-10-CM | POA: Diagnosis not present

## 2019-04-21 DIAGNOSIS — I252 Old myocardial infarction: Secondary | ICD-10-CM | POA: Diagnosis not present

## 2019-04-21 DIAGNOSIS — E669 Obesity, unspecified: Secondary | ICD-10-CM | POA: Diagnosis present

## 2019-04-21 DIAGNOSIS — I4892 Unspecified atrial flutter: Secondary | ICD-10-CM | POA: Diagnosis present

## 2019-04-21 DIAGNOSIS — E875 Hyperkalemia: Secondary | ICD-10-CM | POA: Diagnosis present

## 2019-04-21 DIAGNOSIS — Y95 Nosocomial condition: Secondary | ICD-10-CM | POA: Diagnosis present

## 2019-04-21 DIAGNOSIS — I255 Ischemic cardiomyopathy: Secondary | ICD-10-CM | POA: Diagnosis present

## 2019-04-21 DIAGNOSIS — Z95810 Presence of automatic (implantable) cardiac defibrillator: Secondary | ICD-10-CM

## 2019-04-21 DIAGNOSIS — E119 Type 2 diabetes mellitus without complications: Secondary | ICD-10-CM | POA: Diagnosis present

## 2019-04-21 DIAGNOSIS — Z91041 Radiographic dye allergy status: Secondary | ICD-10-CM | POA: Diagnosis not present

## 2019-04-21 DIAGNOSIS — Z20828 Contact with and (suspected) exposure to other viral communicable diseases: Secondary | ICD-10-CM | POA: Diagnosis present

## 2019-04-21 DIAGNOSIS — I482 Chronic atrial fibrillation, unspecified: Secondary | ICD-10-CM | POA: Diagnosis present

## 2019-04-21 DIAGNOSIS — L89150 Pressure ulcer of sacral region, unstageable: Secondary | ICD-10-CM | POA: Diagnosis not present

## 2019-04-21 DIAGNOSIS — G4733 Obstructive sleep apnea (adult) (pediatric): Secondary | ICD-10-CM | POA: Diagnosis present

## 2019-04-21 LAB — LACTIC ACID, PLASMA
Lactic Acid, Venous: 4.7 mmol/L (ref 0.5–1.9)
Lactic Acid, Venous: 3.3 mmol/L (ref 0.5–1.9)
Lactic Acid, Venous: 2.5 mmol/L (ref 0.5–1.9)
Lactic Acid, Venous: 1.4 mmol/L (ref 0.5–1.9)

## 2019-04-21 LAB — CBC WITH DIFFERENTIAL/PLATELET
Abs Immature Granulocytes: 0.08 10*3/uL — ABNORMAL HIGH (ref 0.00–0.07)
Basophils Absolute: 0.1 10*3/uL (ref 0.0–0.1)
Basophils Relative: 0 %
Eosinophils Absolute: 0 10*3/uL (ref 0.0–0.5)
Eosinophils Relative: 0 %
HCT: 21.7 % — ABNORMAL LOW (ref 39.0–52.0)
Hemoglobin: 6.7 g/dL — ABNORMAL LOW (ref 13.0–17.0)
Immature Granulocytes: 1 %
Lymphocytes Relative: 9 %
Lymphs Abs: 1.3 10*3/uL (ref 0.7–4.0)
MCH: 26.5 pg (ref 26.0–34.0)
MCHC: 30.9 g/dL (ref 30.0–36.0)
MCV: 85.8 fL (ref 80.0–100.0)
Monocytes Absolute: 0.6 10*3/uL (ref 0.1–1.0)
Monocytes Relative: 5 %
Neutro Abs: 11.6 10*3/uL — ABNORMAL HIGH (ref 1.7–7.7)
Neutrophils Relative %: 85 %
Platelets: 357 10*3/uL (ref 150–400)
RBC: 2.53 MIL/uL — ABNORMAL LOW (ref 4.22–5.81)
RDW: 17.8 % — ABNORMAL HIGH (ref 11.5–15.5)
WBC: 13.6 10*3/uL — ABNORMAL HIGH (ref 4.0–10.5)
nRBC: 0 % (ref 0.0–0.2)

## 2019-04-21 LAB — PREPARE RBC (CROSSMATCH)

## 2019-04-21 LAB — COMPREHENSIVE METABOLIC PANEL
ALT: 13 U/L (ref 0–44)
ALT: 14 U/L (ref 0–44)
AST: 28 U/L (ref 15–41)
AST: 33 U/L (ref 15–41)
Albumin: 1.8 g/dL — ABNORMAL LOW (ref 3.5–5.0)
Albumin: 1.7 g/dL — ABNORMAL LOW (ref 3.5–5.0)
Alkaline Phosphatase: 211 U/L — ABNORMAL HIGH (ref 38–126)
Alkaline Phosphatase: 174 U/L — ABNORMAL HIGH (ref 38–126)
Anion gap: 11 (ref 5–15)
Anion gap: 8 (ref 5–15)
BUN: 25 mg/dL — ABNORMAL HIGH (ref 8–23)
BUN: 26 mg/dL — ABNORMAL HIGH (ref 8–23)
CO2: 21 mmol/L — ABNORMAL LOW (ref 22–32)
CO2: 22 mmol/L (ref 22–32)
Calcium: 8.3 mg/dL — ABNORMAL LOW (ref 8.9–10.3)
Calcium: 7.7 mg/dL — ABNORMAL LOW (ref 8.9–10.3)
Chloride: 105 mmol/L (ref 98–111)
Chloride: 108 mmol/L (ref 98–111)
Creatinine, Ser: 1.07 mg/dL (ref 0.61–1.24)
Creatinine, Ser: 1.06 mg/dL (ref 0.61–1.24)
GFR calc Af Amer: >60 mL/min (ref >60)
GFR calc Af Amer: >60 mL/min (ref >60)
GFR calc non Af Amer: >60 mL/min (ref >60)
GFR calc non Af Amer: >60 mL/min (ref >60)
Glucose, Bld: 140 mg/dL — ABNORMAL HIGH (ref 70–99)
Glucose, Bld: 132 mg/dL — ABNORMAL HIGH (ref 70–99)
Potassium: 5.5 mmol/L — ABNORMAL HIGH (ref 3.5–5.1)
Potassium: 5.5 mmol/L — ABNORMAL HIGH (ref 3.5–5.1)
Sodium: 137 mmol/L (ref 135–145)
Sodium: 138 mmol/L (ref 135–145)
Total Bilirubin: 0.6 mg/dL (ref 0.3–1.2)
Total Bilirubin: 0.8 mg/dL (ref 0.3–1.2)
Total Protein: 5.7 g/dL — ABNORMAL LOW (ref 6.5–8.1)
Total Protein: 5.1 g/dL — ABNORMAL LOW (ref 6.5–8.1)

## 2019-04-21 LAB — CBC
HCT: 23.8 % — ABNORMAL LOW (ref 39.0–52.0)
Hemoglobin: 7 g/dL — ABNORMAL LOW (ref 13.0–17.0)
MCH: 25.6 pg — ABNORMAL LOW (ref 26.0–34.0)
MCHC: 29.4 g/dL — ABNORMAL LOW (ref 30.0–36.0)
MCV: 87.2 fL (ref 80.0–100.0)
Platelets: 476 10*3/uL — ABNORMAL HIGH (ref 150–400)
RBC: 2.73 MIL/uL — ABNORMAL LOW (ref 4.22–5.81)
RDW: 18.7 % — ABNORMAL HIGH (ref 11.5–15.5)
WBC: 18.5 10*3/uL — ABNORMAL HIGH (ref 4.0–10.5)
nRBC: 0 % (ref 0.0–0.2)

## 2019-04-21 LAB — MRSA PCR SCREENING: MRSA by PCR: NEGATIVE

## 2019-04-21 LAB — ABO/RH: ABO/RH(D): B POS

## 2019-04-21 LAB — PHOSPHORUS: Phosphorus: 4.8 mg/dL — ABNORMAL HIGH (ref 2.5–4.6)

## 2019-04-21 LAB — APTT
aPTT: 49 seconds — ABNORMAL HIGH (ref 24–36)
aPTT: 47 seconds — ABNORMAL HIGH (ref 24–36)

## 2019-04-21 LAB — PROTIME-INR
INR: 3.8 — ABNORMAL HIGH (ref 0.8–1.2)
INR: 3 — ABNORMAL HIGH (ref 0.8–1.2)
Prothrombin Time: 36.5 seconds — ABNORMAL HIGH (ref 11.4–15.2)
Prothrombin Time: 30.6 seconds — ABNORMAL HIGH (ref 11.4–15.2)

## 2019-04-21 LAB — SARS CORONAVIRUS 2 BY RT PCR (HOSPITAL ORDER, PERFORMED IN ~~LOC~~ HOSPITAL LAB): SARS Coronavirus 2: NEGATIVE

## 2019-04-21 LAB — MAGNESIUM: Magnesium: 1.9 mg/dL (ref 1.7–2.4)

## 2019-04-21 LAB — GLUCOSE, CAPILLARY
Glucose-Capillary: 106 mg/dL — ABNORMAL HIGH (ref 70–99)
Glucose-Capillary: 120 mg/dL — ABNORMAL HIGH (ref 70–99)

## 2019-04-21 MED ORDER — ASPIRIN 81 MG PO CHEW
81.0000 mg | CHEWABLE_TABLET | Freq: Every day | ORAL | Status: DC
  Administered 2019-04-22 – 2019-04-26 (×5): 81 mg via ORAL
  Filled 2019-04-21 (×5): qty 1

## 2019-04-21 MED ORDER — SODIUM CHLORIDE 0.9 % IV SOLN
1000.0000 mL | Freq: Once | INTRAVENOUS | Status: AC
  Administered 2019-04-21: 14:00:00 1000 mL via INTRAVENOUS

## 2019-04-21 MED ORDER — INSULIN ASPART 100 UNIT/ML ~~LOC~~ SOLN
0.0000 [IU] | Freq: Three times a day (TID) | SUBCUTANEOUS | Status: DC
  Administered 2019-04-24 – 2019-04-26 (×4): 1 [IU] via SUBCUTANEOUS
  Filled 2019-04-21 (×4): qty 1

## 2019-04-21 MED ORDER — METOPROLOL SUCCINATE ER 25 MG PO TB24
37.5000 mg | ORAL_TABLET | Freq: Every day | ORAL | Status: DC
  Administered 2019-04-22 – 2019-04-23 (×2): 37.5 mg via ORAL
  Filled 2019-04-21 (×3): qty 1.5
  Filled 2019-04-21: qty 2

## 2019-04-21 MED ORDER — NITROGLYCERIN 0.4 MG SL SUBL: 0.4000 mg | SUBLINGUAL_TABLET | SUBLINGUAL | Status: DC | PRN

## 2019-04-21 MED ORDER — INSULIN ASPART 100 UNIT/ML ~~LOC~~ SOLN
6.0000 [IU] | Freq: Three times a day (TID) | SUBCUTANEOUS | Status: DC
  Administered 2019-04-22 – 2019-04-26 (×10): 6 [IU] via SUBCUTANEOUS
  Filled 2019-04-21 (×10): qty 1

## 2019-04-21 MED ORDER — FUROSEMIDE 10 MG/ML IJ SOLN
20.0000 mg | Freq: Once | INTRAMUSCULAR | Status: AC
  Administered 2019-04-21: 20:00:00 20 mg via INTRAVENOUS
  Filled 2019-04-21: qty 2

## 2019-04-21 MED ORDER — VANCOMYCIN HCL IN DEXTROSE 1-5 GM/200ML-% IV SOLN: 1000.0000 mg | Freq: Once | INTRAVENOUS | Status: DC

## 2019-04-21 MED ORDER — ALBUMIN HUMAN 25 % IV SOLN
50.0000 g | Freq: Once | INTRAVENOUS | Status: AC
  Administered 2019-04-22: 50 g via INTRAVENOUS
  Filled 2019-04-21: qty 50

## 2019-04-21 MED ORDER — ADULT MULTIVITAMIN W/MINERALS CH
1.0000 | ORAL_TABLET | Freq: Every day | ORAL | Status: DC
  Administered 2019-04-22: 1 via ORAL
  Filled 2019-04-21: qty 1

## 2019-04-21 MED ORDER — FINASTERIDE 5 MG PO TABS
5.0000 mg | ORAL_TABLET | Freq: Every day | ORAL | Status: DC
  Administered 2019-04-22 – 2019-04-26 (×5): 5 mg via ORAL
  Filled 2019-04-21 (×5): qty 1

## 2019-04-21 MED ORDER — BALSALAZIDE DISODIUM 750 MG PO CAPS: 750.0000 mg | ORAL_CAPSULE | Freq: Three times a day (TID) | ORAL | Status: DC

## 2019-04-21 MED ORDER — VITAMIN C 500 MG PO TABS
500.0000 mg | ORAL_TABLET | Freq: Two times a day (BID) | ORAL | Status: DC
  Administered 2019-04-21 – 2019-04-26 (×10): 500 mg via ORAL
  Filled 2019-04-21 (×10): qty 1

## 2019-04-21 MED ORDER — ACETAMINOPHEN 325 MG PO TABS
650.0000 mg | ORAL_TABLET | Freq: Four times a day (QID) | ORAL | Status: DC | PRN
  Administered 2019-04-25: 650 mg via ORAL
  Filled 2019-04-21: qty 2

## 2019-04-21 MED ORDER — NIACIN ER (ANTIHYPERLIPIDEMIC) 500 MG PO TBCR
1000.0000 mg | EXTENDED_RELEASE_TABLET | Freq: Every day | ORAL | Status: DC
  Administered 2019-04-22 – 2019-04-26 (×5): 1000 mg via ORAL
  Filled 2019-04-21 (×5): qty 2

## 2019-04-21 MED ORDER — ONDANSETRON HCL 4 MG PO TABS: 4.0000 mg | ORAL_TABLET | Freq: Four times a day (QID) | ORAL | Status: DC | PRN

## 2019-04-21 MED ORDER — AMIODARONE HCL 200 MG PO TABS
200.0000 mg | ORAL_TABLET | Freq: Every day | ORAL | Status: DC
  Administered 2019-04-21 – 2019-04-24 (×4): 200 mg via ORAL
  Filled 2019-04-21 (×4): qty 1

## 2019-04-21 MED ORDER — PANTOPRAZOLE SODIUM 40 MG PO TBEC
40.0000 mg | DELAYED_RELEASE_TABLET | Freq: Every day | ORAL | Status: DC
  Administered 2019-04-22 – 2019-04-26 (×5): 40 mg via ORAL
  Filled 2019-04-21 (×5): qty 1

## 2019-04-21 MED ORDER — ACETAMINOPHEN 650 MG RE SUPP: 650.0000 mg | Freq: Four times a day (QID) | RECTAL | Status: DC | PRN

## 2019-04-21 MED ORDER — ATORVASTATIN CALCIUM 20 MG PO TABS
80.0000 mg | ORAL_TABLET | Freq: Every day | ORAL | Status: DC
  Administered 2019-04-21 – 2019-04-26 (×6): 80 mg via ORAL
  Filled 2019-04-21 (×6): qty 4

## 2019-04-21 MED ORDER — HYDROCODONE-ACETAMINOPHEN 5-325 MG PO TABS
1.0000 | ORAL_TABLET | ORAL | Status: DC | PRN
  Administered 2019-04-23 – 2019-04-24 (×5): 1 via ORAL
  Filled 2019-04-21 (×5): qty 1

## 2019-04-21 MED ORDER — NEPRO/CARBSTEADY PO LIQD: 237.0000 mL | Freq: Two times a day (BID) | ORAL | Status: DC

## 2019-04-21 MED ORDER — VANCOMYCIN HCL 10 G IV SOLR
1250.0000 mg | INTRAVENOUS | Status: DC
  Administered 2019-04-22: 1250 mg via INTRAVENOUS
  Filled 2019-04-21 (×2): qty 1250

## 2019-04-21 MED ORDER — SODIUM CHLORIDE 0.9% IV SOLUTION
Freq: Once | INTRAVENOUS | Status: AC
  Administered 2019-04-21: 500 mL via INTRAVENOUS

## 2019-04-21 MED ORDER — BISACODYL 5 MG PO TBEC: 5.0000 mg | DELAYED_RELEASE_TABLET | Freq: Every day | ORAL | Status: DC | PRN

## 2019-04-21 MED ORDER — CHLORHEXIDINE GLUCONATE CLOTH 2 % EX PADS
6.0000 | MEDICATED_PAD | Freq: Every day | CUTANEOUS | Status: DC
  Administered 2019-04-21 – 2019-04-24 (×4): 6 via TOPICAL

## 2019-04-21 MED ORDER — METRONIDAZOLE IN NACL 5-0.79 MG/ML-% IV SOLN
500.0000 mg | Freq: Once | INTRAVENOUS | Status: AC
  Administered 2019-04-21: 14:00:00 500 mg via INTRAVENOUS
  Filled 2019-04-21: qty 100

## 2019-04-21 MED ORDER — INSULIN DETEMIR 100 UNIT/ML ~~LOC~~ SOLN
20.0000 [IU] | Freq: Every day | SUBCUTANEOUS | Status: DC
  Administered 2019-04-22: 20 [IU] via SUBCUTANEOUS
  Filled 2019-04-21 (×2): qty 0.2

## 2019-04-21 MED ORDER — FLORANEX PO PACK
1.0000 g | PACK | Freq: Three times a day (TID) | ORAL | Status: DC
  Administered 2019-04-22 – 2019-04-25 (×10): 1 g via ORAL
  Filled 2019-04-21 (×16): qty 1

## 2019-04-21 MED ORDER — SODIUM CHLORIDE 0.9% IV SOLUTION
Freq: Once | INTRAVENOUS | Status: AC
  Administered 2019-04-22: 01:00:00 500 mL via INTRAVENOUS

## 2019-04-21 MED ORDER — ATORVASTATIN CALCIUM 20 MG PO TABS: 80.0000 mg | ORAL_TABLET | Freq: Every day | ORAL | Status: DC

## 2019-04-21 MED ORDER — SODIUM CHLORIDE 0.9 % IV SOLN
2.0000 g | Freq: Three times a day (TID) | INTRAVENOUS | Status: DC
  Administered 2019-04-21 – 2019-04-25 (×13): 2 g via INTRAVENOUS
  Filled 2019-04-21 (×21): qty 2

## 2019-04-21 MED ORDER — ALBUTEROL SULFATE (2.5 MG/3ML) 0.083% IN NEBU: 2.5000 mg | INHALATION_SOLUTION | RESPIRATORY_TRACT | Status: DC | PRN

## 2019-04-21 MED ORDER — VANCOMYCIN HCL 10 G IV SOLR
2000.0000 mg | Freq: Once | INTRAVENOUS | Status: AC
  Administered 2019-04-21: 2000 mg via INTRAVENOUS
  Filled 2019-04-21: qty 2000

## 2019-04-21 MED ORDER — TAMSULOSIN HCL 0.4 MG PO CAPS
0.4000 mg | ORAL_CAPSULE | Freq: Every day | ORAL | Status: DC
  Administered 2019-04-22 – 2019-04-26 (×5): 0.4 mg via ORAL
  Filled 2019-04-21 (×6): qty 1

## 2019-04-21 MED ORDER — ONDANSETRON HCL 4 MG/2ML IJ SOLN: 4.0000 mg | Freq: Four times a day (QID) | INTRAMUSCULAR | Status: DC | PRN

## 2019-04-21 MED ORDER — MORPHINE SULFATE (PF) 4 MG/ML IV SOLN
4.0000 mg | INTRAVENOUS | Status: DC | PRN
  Administered 2019-04-23 (×2): 4 mg via INTRAVENOUS
  Filled 2019-04-21 (×2): qty 1

## 2019-04-21 MED ORDER — SENNOSIDES-DOCUSATE SODIUM 8.6-50 MG PO TABS: 1.0000 | ORAL_TABLET | Freq: Every evening | ORAL | Status: DC | PRN

## 2019-04-21 MED ORDER — SODIUM CHLORIDE 0.9 % IV SOLN
INTRAVENOUS | Status: DC
  Administered 2019-04-21: 20:00:00 900 mL via INTRAVENOUS
  Administered 2019-04-22: 50 mL/h via INTRAVENOUS

## 2019-04-21 MED ORDER — SODIUM CHLORIDE 0.9 % IV SOLN
2.0000 g | Freq: Once | INTRAVENOUS | Status: AC
  Administered 2019-04-21: 13:00:00 2 g via INTRAVENOUS
  Filled 2019-04-21: qty 2

## 2019-04-21 MED ORDER — INSULIN ASPART 100 UNIT/ML ~~LOC~~ SOLN: 0.0000 [IU] | Freq: Every day | SUBCUTANEOUS | Status: DC

## 2019-04-21 NOTE — ED Notes (Signed)
Date and time results received: 04/21/19 1713 (use smartphrase ".now" to insert current time)  Test: Lactic Critical Value: 2.5   Name of Provider Notified: Dr Bridgett Larsson  Orders Received? Or Actions Taken?: Sent message, no new orders at this time.

## 2019-04-21 NOTE — Progress Notes (Signed)
CODE SEPSIS - PHARMACY COMMUNICATION  **Broad Spectrum Antibiotics should be administered within 1 hour of Sepsis diagnosis**  Time Code Sepsis Called/Page Received: 1248  Antibiotics Ordered: Vancomycin, cefepime, and metronidazole  Time of 1st antibiotic administration: 1328  Additional action taken by pharmacy: None required   [place] Resident 04/21/2019  1:25 PM

## 2019-04-21 NOTE — Consult Note (Signed)
SURGICAL CONSULTATION NOTE   HISTORY OF PRESENT ILLNESS (HPI):  68 y.o. male presented to [facility] ED for evaluation of bleeding sacral ulcer. Patient reports he developed an ulcer during the last admission due to a severe pneumonia.  He was discharged to a skilled nursing facility and the ulcer has been getting worse.  In the last few days he has been bleeding without being able to control up with local care.  Patient denies pains on the area.  There is no pain radiation.  There is no alleviating or aggravating factor.  Patient is currently on anticoagulation due to atrial flutter.  Patient is on Xarelto.  Even though he is on Xarelto his INR upon arrival to the ED was 3.8.  Hemoglobin has dropped to 7 from his baseline that is over 9.  Surgery is consulted by Dr. Terrilee Files in this context for evaluation and management of bleeding sacral ulcer.  PAST MEDICAL HISTORY (PMH):  Past Medical History:  Diagnosis Date  . Atrial flutter ([place])    a. s/p Cardioversion 11/22/13, on amiodarone and Xarelto.  . Chronic systolic heart failure ([place])    a. 10/2013 EF 20-25%, grade III DD, RV mildly dilated and sys fx mild/mod reduced;  b. 01/2014 Echo: EF 30-35%, gr3 DD, mod dil LA.  Marland Kitchen Coronary artery disease    a. s/p MI 2007/2015;  b. s/p prior PCI to the LAD/LCX/PDA/PL;  c. 2008: s/p Cypher DES to the OM.  Marland Kitchen Crohn's ileocolitis ([place])   . GERD (gastroesophageal reflux disease)   . Hx of adenomatous colonic polyps 11/2003  . Hyperlipidemia   . Hypertension   . Ischemic cardiomyopathy    s. 01/2014 s/p MDT [id number] [name] XT DR single lead AICD.  Marland Kitchen Obesity   . Paroxysmal atrial fibrillation (H[facility])    a. CHA2DS2VASc = 4-->xarelto/amio.  . Sleep apnea   . Syncope    a.  11/2013 in setting of volume depletion and bradycardia due to dig toxicity   . Type II diabetes mellitus ([place])      PAST SURGICAL HISTORY ([place]):  Past Surgical History:  Procedure Laterality Date  . ATRIAL FLUTTER ABLATION N/A 04/16/2014   Procedure: ATRIAL FLUTTER ABLATION;  Surgeon: Evans Lance, MD;  Location: [facility] CATH LAB;  Service: Cardiovascular;  Laterality: N/A;  . CARDIAC CATHETERIZATION  10/2013  . CARDIAC DEFIBRILLATOR PLACEMENT  04/16/2014   Medtronic Evira device  . CARDIAC ELECTROPHYSIOLOGY STUDY AND ABLATION  04/16/2014   atrial flutter ablation  . CARDIOVERSION N/A 03/05/2014   Procedure: CARDIOVERSION;  Surgeon: Jolaine Artist, MD;  Location: [facility] ENDOSCOPY;  Service: Cardiovascular;  Laterality: N/A;  . CATARACT EXTRACTION W/PHACO Right 01/04/2017   Procedure: CATARACT EXTRACTION PHACO AND INTRAOCULAR LENS PLACEMENT ([place])  Right Diabetic Complicated;  Surgeon: Leandrew Koyanagi, MD;  Location: [place];  Service: Ophthalmology;  Laterality: Right;  Diabetic  . CATARACT EXTRACTION W/PHACO Left 02/08/2017   Procedure: CATARACT EXTRACTION PHACO AND INTRAOCULAR LENS PLACEMENT ([place]) left diabetic;  Surgeon: Leandrew Koyanagi, MD;  Location: [place];  Service: Ophthalmology;  Laterality: Left;  Diabetic - oral meds sleep apnea  . CORONARY ANGIOPLASTY WITH STENT PLACEMENT  2007; 2008 X 2   "1+1 ~ 1"  . FOOT SURGERY Left    bone spur  . HYDROCELE EXCISION Bilateral   . 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 IMPLANTABLE CARDIOVERTER DEFIBRILLATOR IMPLANT N/A 04/16/2014   Procedure: IMPLANTABLE CARDIOVERTER DEFIBRILLATOR IMPLANT;  Surgeon: Evans Lance, MD;  Location: [place] CATH LAB;  Service: Cardiovascular;  Laterality: N/A;  . LEFT HEART CATHETERIZATION WITH CORONARY ANGIOGRAM N/A 11/22/2013   Procedure: LEFT HEART CATHETERIZATION WITH CORONARY ANGIOGRAM;  Surgeon: Sinclair Grooms, MD;  Location: [facility] CATH LAB;  Service: Cardiovascular;  Laterality: N/A;     MEDICATIONS:  Prior to Admission medications   Medication Sig Start Date End Date Taking? Authorizing Provider  acetaminophen (TYLENOL) 325 MG tablet Take 1-2 tablets (325-650 mg total) by mouth every 4  (four) hours as needed for mild pain. 04/17/14  Yes [name], NP  Albuterol Sulfate 108 (90 Base) MCG/ACT AEPB Inhale 1 puff into the lungs every 6 (six) hours as needed (shortness of breath).    Yes [provider]  amiodarone (PACERONE) 200 MG tablet Take 1 tablet (200 mg total) by mouth daily. 03/20/19  Yes Wieting, Richard, MD  aspirin 81 MG tablet Take 81 mg by mouth daily.    Yes [provider]  atorvastatin (LIPITOR) 80 MG tablet TAKE 1 TABLET BY MOUTH EVERY DAY Patient taking differently: Take 80 mg by mouth daily.  03/08/19  Yes Gollan, Kathlene November, MD  balsalazide (COLAZAL) 750 MG capsule TAKE 1 CAPSULE (750 MG TOTAL) BY MOUTH 3 (THREE) TIMES DAILY. Patient taking differently: Take 750 mg by mouth 3 (three) times daily.  11/28/18  Yes Ladene Artist, MD  finasteride (PROSCAR) 5 MG tablet Take 1 tablet (5 mg total) by mouth daily. 03/20/19  Yes Wieting, Richard, MD  insulin aspart (NOVOLOG) 100 UNIT/ML injection Inject 5 Units into the skin 3 (three) times daily with meals. Patient taking differently: Inject 6 Units into the skin 3 (three) times daily before meals.  03/19/19  Yes Wieting, Richard, MD  insulin detemir (LEVEMIR) 100 UNIT/ML injection Inject 0.13 mLs (13 Units total) into the skin daily. Patient taking differently: Inject 20 Units into the skin daily.  03/19/19  Yes Wieting, Richard, MD  lactobacillus (FLORANEX/LACTINEX) PACK Take 1 packet (1 g total) by mouth 3 (three) times daily with meals. 03/19/19  Yes Wieting, Richard, MD  metoprolol succinate (TOPROL-XL) 25 MG 24 hr tablet Take 1 tablet (25 mg total) by mouth daily. Patient taking differently: Take 37.5 mg by mouth daily.  03/19/19  Yes Wieting, Richard, MD  Multiple Vitamins-Minerals (MULTIVITAMIN WITH MINERALS) tablet Take 1 tablet by mouth daily.   Yes [provider]  nitroGLYCERIN (NITROSTAT) 0.4 MG SL tablet Place 0.4 mg under the tongue every 5 (five) minutes as needed for chest pain (MAX  3 TABLETS).    Yes [provider]  Nutritional Supplements (FEEDING SUPPLEMENT, NEPRO CARB STEADY,) LIQD Take 237 mLs by mouth 2 (two) times daily between meals. 03/19/19  Yes Wieting, Richard, MD  omeprazole (PRILOSEC) 20 MG capsule Take 20 mg by mouth daily.    Yes [provider]  potassium chloride SA (K-DUR) 20 MEQ tablet Take 1 tablet (20 mEq total) by mouth daily. 04/09/19  Yes Gollan, Kathlene November, MD  sodium hypochlorite (DAKIN'S 1/2 STRENGTH) external solution Irrigate with 1 application as directed 2 (two) times daily. (apply to sacral wound)   Yes [provider]  tamsulosin (FLOMAX) 0.4 MG CAPS capsule Take 1 capsule (0.4 mg total) by mouth daily after breakfast. 03/20/19  Yes Wieting, Richard, MD  vitamin C (ASCORBIC ACID) 500 MG tablet Take 500 mg by mouth 2 (two) times daily.   Yes [provider]  XARELTO 20 MG TABS tablet TAKE 1 TABLET BY MOUTH EVERY DAY WITH  LUNCH Patient taking differently: Take 20 mg by mouth daily.  11/23/18  Yes Minna Merritts, MD  niacin (NIASPAN) 1000 MG CR tablet Take 1,000 mg by mouth daily. 04/10/19   [provider]     ALLERGIES:  Allergies  Allergen Reactions  . Iodine Other (See Comments)    Shortness of breath, swelling and hives  . Shrimp [Shellfish Allergy] Other (See Comments)    SWELLING    HIVES    SHORTNESS OF BREATH  . Tetracycline Rash     SOCIAL HISTORY:  Social History   Socioeconomic History  . Marital status: Widowed    Spouse name: Not on file  . Number of children: 1  . Years of education: Not on file  . Highest education level: Not on file  Occupational History  . Occupation: retired    Fish farm manager: RETIRED  Social Needs  . Financial resource strain: Not on file  . Food insecurity    Worry: Not on file    Inability: Not on file  . Transportation needs    Medical: Not on file    Non-medical: Not on file  Tobacco Use  . Smoking status: Never Smoker  . Smokeless tobacco: Never  Used  Substance and Sexual Activity  . Alcohol use: No  . Drug use: No  . Sexual activity: Never  Lifestyle  . Physical activity    Days per week: Not on file    Minutes per session: Not on file  . Stress: Not on file  Relationships  . Social Herbalist on phone: Not on file    Gets together: Not on file    Attends religious service: Not on file    Active member of club or organization: Not on file    Attends meetings of clubs or organizations: Not on file    Relationship status: Not on file  . Intimate partner violence    Fear of current or ex partner: Not on file    Emotionally abused: Not on file    Physically abused: Not on file    Forced sexual activity: Not on file  Other Topics Concern  . Not on file  Social History Narrative  . Not on file    FAMILY HISTORY:  Family History  Problem Relation Age of Onset  . Breast cancer Mother   . Heart disease Father   . Heart attack Father   . Colon cancer Neg Hx      REVIEW OF SYSTEMS:  Constitutional: denies weight loss, fever, chills, or sweats  Eyes: denies any other vision changes, history of eye injury  ENT: denies sore throat, hearing problems  Respiratory: Positive shortness of breath, and cough Cardiovascular: denies chest pain, palpitations  Gastrointestinal: denies abdominal pain, N/V,  Genitourinary: denies burning with urination or urinary frequency Musculoskeletal: denies any other joint pains or cramps  Skin: denies any other rashes or skin discolorations.  Positive for ulcer Neurological: denies any other headache, dizziness, weakness  Psychiatric: denies any other depression, anxiety  Hematologic: Positive for easy bleeding  All other review of systems were negative   VITAL SIGNS:  Temp:  [97.5 F (36.4 C)-99.3 F (37.4 C)] 98 F (36.7 C) (09/20 2000) Pulse Rate:  [99-115] 99 (09/20 1819) Resp:  [18-33] 26 (09/20 1857) BP: (78-126)/(42-108) 82/42 (09/20 1857) SpO2:  [95 %-100 %] 96 %  (09/20 1857) Weight:  [120.4 kg-122.5 kg] 120.4 kg (09/20 1857)     Height: 5' 11"  (180.3  cm) Weight: 120.4 kg BMI (Calculated): 37.04    PHYSICAL EXAM:  Constitutional:  -- Normal body habitus  -- Awake, alert, and oriented x3  Eyes:  -- Pupils equally round and reactive to light  -- No scleral icterus  Ear, nose, and throat:  -- No jugular venous distension  Pulmonary:  -- No crackles  -- Equal breath sounds bilaterally -- Breathing non-labored at rest Cardiovascular:  -- S1, S2 present  -- No pericardial rubs Gastrointestinal:  -- Abdomen soft, nontender, non-distended, no guarding or rebound tenderness -- No abdominal masses appreciated, pulsatile or otherwise  Musculoskeletal and Integumentary:  -- Wounds or skin discoloration: Large sacral ulcer with necrotic bed, abundant clots.  Minimal purulent drainage. 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 With scissors, necrotic tissue and blood clots from the sacral area was resected. Ischemic fat tissue and muscle and bone were debrided down to the sacral bone on the midline and to the gluteal muscle laterally. Hemostasis achieved with pressure. Wet to dry dressing was applied. Patient tolerated the procedure well.   Sacral ulcer after debridement     Labs:  CBC Latest Ref Rng & Units 04/21/2019 03/18/2019 03/16/2019  WBC 4.0 - 10.5 K/uL 18.5(H) 20.6(H) 24.2(H)  Hemoglobin 13.0 - 17.0 g/dL  7.0(L) 9.5(L) 10.1(L)  Hematocrit 39.0 - 52.0 % 23.8(L) 30.1(L) 32.0(L)  Platelets 150 - 400 K/uL 476(H) 202 228   CMP Latest Ref Rng & Units 04/21/2019 03/19/2019 03/18/2019  Glucose 70 - 99 mg/dL 140(H) 123(H) 168(H)  BUN 8 - 23 mg/dL 25(H) 51(H) 60(H)  Creatinine 0.61 - 1.24 mg/dL 1.07 1.73(H) 1.74(H)  Sodium 135 - 145 mmol/L 137 134(L) 135  Potassium 3.5 - 5.1 mmol/L 5.5(H) 3.3(L) 3.1(L)  Chloride 98 - 111 mmol/L 105 99 92(L)  CO2 22 - 32 mmol/L 21(L) 23 25  Calcium 8.9 - 10.3 mg/dL 8.3(L) 8.0(L) 7.9(L)  Total Protein 6.5 - 8.1 g/dL 5.7(L) - -  Total Bilirubin 0.3 - 1.2 mg/dL 0.6 - -  Alkaline Phos 38 - 126 U/L 211(H) - -  AST 15 - 41 U/L 28 - -  ALT 0 - 44 U/L 13 - -     Imaging studies:  I evaluated the chest x-ray that shows consolidation in the right side.  Assessment/Plan:  68 y.o. male with large stage IV bleeding sacral ulcer, complicated by pertinent comorbidities including atrial flutter, over anticoagulated, hypertension, coronary disease.  Patient with a large stage IV sacral ulcer with bleeding.  Upon evaluation on the physical exam the source of bleeding is a sacral ulcer.  I debrided abundant amount of clots and most of the necrotic tissue of the ulcer bed down to the bone.  Since patient is over anticoagulated I did not went further down to the tract on bilateral thigh.  I applied wet-to-dry dressing.  I recommend to consult wound care for aggressive local care.  He may need to have another debridement when more stable.  I agree with current  management with the goal of controlling the over anticoagulation to be able to stop the bleeding.  I will continue to be aware and follow patient progression of the ulcer for further management.  Arnold Long, MD

## 2019-04-21 NOTE — ED Triage Notes (Signed)
Pt arrived via [facility] from [facility]. Pt has stage 4 sacrum wound that was bleeding and pt developed shortness of breath. Pt was placed on 2L O2 by EMS and O2 was 98%.

## 2019-04-21 NOTE — ED Notes (Signed)
Date and time results received: 04/21/19 1350 (use smartphrase ".now" to insert current time)  Test: Lactic  Critical Value: 4.7  Name of Provider Notified: Dr Corky Downs  Orders Received? Or Actions Taken?: No new orders at this time

## 2019-04-21 NOTE — ED Notes (Signed)
Tried to call report to ICU and they said that Colton Lamb was not going to room 15 that he was going to room 12 and it needed to be cleaned.

## 2019-04-21 NOTE — Progress Notes (Addendum)
Notified provider and bedside nurse of need to order and administer fluid bolus. Secure chat sent to admitting MD and assigned RNs notify for Lactate 4.7 and no fluids ordered. I have asked for them to consider ordering fluids of 3,672m which will meet the weight requirement.  Bedside RN responds back He has a significant history of CHF and recent effussion. The ER doc did not want to do fluid bolus because of this. The admitting doc is seeing him now, I wil readress.

## 2019-04-21 NOTE — Consult Note (Signed)
PHARMACY -  BRIEF ANTIBIOTIC NOTE   Pharmacy has received consult(s) for Cefepime & Vancomycin from an ED provider.  The patient's profile has been reviewed for ht/wt/allergies/indication/available labs.   HPI: BIBEMS from [facility] with stage 4 sacrum wound and shortness of breath.   No weight entered yet this admission. Most recent weight in Care Everywhere was 135.4 kg in 12/2018.   One time order(s) placed for  -Cefepime 2 g -Vancomycin 2 g  -Metronidazole 500 mg  Further antibiotics/pharmacy consults should be ordered by admitting physician if indicated.                       Thank you, Benita Gutter 04/21/2019  12:56 PM

## 2019-04-21 NOTE — Consult Note (Signed)
Pharmacy Antibiotic Note  Colton Lamb is a 68 y.o. male admitted on 04/21/2019 with sepsis. Patient is a 68 y/o M BIBEMS from nursing home with shortness of breath and stage 4 sacral wound c/b bleeding. CXR significant for RLL opacity. Pharmacy has been consulted for vancomycin & cefepime dosing.  Plan: Cefepime 2g q8h  Loading dose of Vancomycin 2 g x1 followed by  Vancomycin 1250 mg IV q18h. Goal AUC 400-550. Expected AUC: 421.3, trough 10.2 SCr used: 1.07  Continue to follow culture results and clinical signs of improvement with antibiotic therapy. Plan to obtain levels in 4-5 days if patient remains on therapy. Follow daily Scr per protocol.     Temp (24hrs), Avg:97.7 F (36.5 C), Min:97.7 F (36.5 C), Max:97.7 F (36.5 C)  Recent Labs  Lab 04/21/19 1109  WBC 18.5*  CREATININE 1.07  LATICACIDVEN 4.7*    Estimated Creatinine Clearance: 89.3 mL/min (by C-G formula based on SCr of 1.07 mg/dL).    Allergies  Allergen Reactions  . Iodine Other (See Comments)    Shortness of breath, swelling and hives  . Shrimp [Shellfish Allergy] Other (See Comments)    SWELLING    HIVES    SHORTNESS OF BREATH  . Tetracycline Rash    Antimicrobials this admission: Vancomycin 9/20 >>  Cefepime 9/20 >>  Metronidazole 9/20 x1  Dose adjustments this admission: N/A  Microbiology results: 9/20 BCx: pending  Thank you for allowing pharmacy to be a part of this patient's care.  [place] Resident 04/21/2019 2:17 PM

## 2019-04-21 NOTE — ED Provider Notes (Signed)
[facility] Emergency Department Provider Note   ____________________________________________    I have reviewed the triage vital signs and the nursing notes.   HISTORY  Chief Complaint Shortness of Breath     HPI Colton Lamb is a 68 y.o. male with significant past medical history including atrial flutter on Xarelto, CHF, CAD recent extended admission for pneumonia with empyema requiring drainage, developed decubitus ulcer while in hospital.  Has been in rehab and has been recuperating well, over the last 24 hours has had problems with bleeding from decubitus ulcer, treated by wound physician but continued mild oozing.  Patient also reports his breathing has worsened.  No reports of fever.  Past Medical History:  Diagnosis Date  . Atrial flutter ([place])    a. s/p Cardioversion 11/22/13, on amiodarone and Xarelto.  . Chronic systolic heart failure ([place])    a. 10/2013 EF 20-25%, grade III DD, RV mildly dilated and sys fx mild/mod reduced;  b. 01/2014 Echo: EF 30-35%, gr3 DD, mod dil LA.  Marland Kitchen Coronary artery disease    a. s/p MI 2007/2015;  b. s/p prior PCI to the LAD/LCX/PDA/PL;  c. 2008: s/p Cypher DES to the OM.  Marland Kitchen Crohn's ileocolitis ([place])   . GERD (gastroesophageal reflux disease)   . Hx of adenomatous colonic polyps 11/2003  . Hyperlipidemia   . Hypertension   . Ischemic cardiomyopathy    s. 01/2014 s/p MDT [id number] [name] XT DR single lead AICD.  Marland Kitchen Obesity   . Paroxysmal atrial fibrillation (HCC)    a. CHA2DS2VASc = 4-->xarelto/amio.  . Sleep apnea   . Syncope    a.  11/2013 in setting of volume depletion and bradycardia due to dig toxicity   . Type II diabetes mellitus [facility])     Patient Active Problem List   Diagnosis Date Noted  . Pressure injury of skin 03/17/2019  . Acute renal failure (ARF) ([place])   . Empyema lung ([place])   . Shortness of breath   . Pleural effusion on right   . Sepsis ([place]) 03/04/2019  . Restless legs syndrome (RLS)  06/16/2017  . Hypertension, essential 06/16/2017  . Coronary artery disease of native artery of native heart with stable angina pectoris ([place]) 04/27/2017  . Hydrocephalus ([place]) 09/08/2016  . Dizziness and giddiness   . Elevated troponin 09/06/2016  . Type II diabetes mellitus ([place])   . Ischemic cardiomyopathy   . Hyponatremia 07/01/2015  . Diabetes mellitus type 2, uncontrolled, with complications ([place]) 17/51/0258  . 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 OSA (obstructive sleep apnea) 12/20/2013  . Morbid obesity ([place]) 12/20/2013  . AKI (acute kidney injury) - Creatinine improved at d/c 12/14/2013  . Elevated TSH - will need f/u TFTs with PCP in 3-4 weeks 12/14/2013  . Junctional bradycardia - resolved 12/14/2013  . Syncope - due to bradycardia in setting of Digoxin Toxicity 12/11/2013  . Chronic systolic heart failure ([place]) 12/06/2013  . At risk for sudden cardiac death - on LifeVest 11/29/13  . Acute combined systolic and diastolic CHF, NYHA class 3 -- 2/2 MI 2013/11/29  . Cardiomyopathy, ischemic 2013-11-29  . Atrial flutter ([place]) 11/22/2013  . NSTEMI (non-ST elevated myocardial infarction) ([place]) 11/22/2013  . Long term current use of anticoagulant 10/19/2010  . Hyperlipidemia 05/06/2010  . Edema 05/06/2010  . CAD S/P percutaneous coronary angioplasty - multiple PCIs 11/11/2008  . Atrial fibrillation ([place]) 11/11/2008  . GERD 11/11/2008  . [place] INTESTINE 11/11/2008  .  COLONIC POLYPS, HX OF 11/11/2008  . [place] ALLERGY 11/11/2008    Past Surgical History:  Procedure Laterality Date  . ATRIAL FLUTTER ABLATION N/A 04/16/2014   Procedure: ATRIAL FLUTTER ABLATION;  Surgeon: Evans Lance, MD;  Location: [facility] CATH LAB;  Service: Cardiovascular;  Laterality: N/A;  . CARDIAC CATHETERIZATION  10/2013  . CARDIAC DEFIBRILLATOR PLACEMENT  04/16/2014   Medtronic Evira device  . CARDIAC ELECTROPHYSIOLOGY STUDY AND ABLATION  04/16/2014    atrial flutter ablation  . CARDIOVERSION N/A 03/05/2014   Procedure: CARDIOVERSION;  Surgeon: Jolaine Artist, MD;  Location: [facility] ENDOSCOPY;  Service: Cardiovascular;  Laterality: N/A;  . CATARACT EXTRACTION W/PHACO Right 01/04/2017   Procedure: CATARACT EXTRACTION PHACO AND INTRAOCULAR LENS PLACEMENT ([place])  Right Diabetic Complicated;  Surgeon: Leandrew Koyanagi, MD;  Location: [place];  Service: Ophthalmology;  Laterality: Right;  Diabetic  . CATARACT EXTRACTION W/PHACO Left 02/08/2017   Procedure: CATARACT EXTRACTION PHACO AND INTRAOCULAR LENS PLACEMENT ([place]) left diabetic;  Surgeon: Leandrew Koyanagi, MD;  Location: [place];  Service: Ophthalmology;  Laterality: Left;  Diabetic - oral meds sleep apnea  . CORONARY ANGIOPLASTY WITH STENT PLACEMENT  2007; 2008 X 2   "1+1 ~ 1"  . FOOT SURGERY Left    bone spur  . HYDROCELE EXCISION Bilateral   . 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 IMPLANTABLE CARDIOVERTER DEFIBRILLATOR IMPLANT N/A 04/16/2014   Procedure: IMPLANTABLE CARDIOVERTER DEFIBRILLATOR IMPLANT;  Surgeon: Evans Lance, MD;  Location: [facility] CATH LAB;  Service: Cardiovascular;  Laterality: N/A;  . LEFT HEART CATHETERIZATION WITH CORONARY ANGIOGRAM N/A 11/22/2013   Procedure: LEFT HEART CATHETERIZATION WITH CORONARY ANGIOGRAM;  Surgeon: Sinclair Grooms, MD;  Location: [facility] CATH LAB;  Service: Cardiovascular;  Laterality: N/A;    Prior to Admission medications   Medication Sig Start Date End Date Taking? Authorizing Provider  acetaminophen (TYLENOL) 325 MG tablet Take 1-2 tablets (325-650 mg total) by mouth every 4 (four) hours as needed for mild pain. 04/17/14  Yes [name], NP  Albuterol Sulfate 108 (90 Base) MCG/ACT AEPB Inhale 1 puff into the lungs every 6 (six) hours as needed (shortness of breath).    Yes [provider]  amiodarone (PACERONE) 200 MG tablet Take 1 tablet (200 mg total) by mouth daily.  03/20/19  Yes Wieting, Richard, MD  aspirin 81 MG tablet Take 81 mg by mouth daily.    Yes [provider]  atorvastatin (LIPITOR) 80 MG tablet TAKE 1 TABLET BY MOUTH EVERY DAY Patient taking differently: Take 80 mg by mouth daily.  03/08/19  Yes Gollan, Kathlene November, MD  balsalazide (COLAZAL) 750 MG capsule TAKE 1 CAPSULE (750 MG TOTAL) BY MOUTH 3 (THREE) TIMES DAILY. Patient taking differently: Take 750 mg by mouth 3 (three) times daily.  11/28/18  Yes Ladene Artist, MD  finasteride (PROSCAR) 5 MG tablet Take 1 tablet (5 mg total) by mouth daily. 03/20/19  Yes Wieting, Richard, MD  insulin aspart (NOVOLOG) 100 UNIT/ML injection Inject 5 Units into the skin 3 (three) times daily with meals. Patient taking differently: Inject 6 Units into the skin 3 (three) times daily before meals.  03/19/19  Yes Wieting, Richard, MD  insulin detemir (LEVEMIR) 100 UNIT/ML injection Inject 0.13 mLs (13 Units total) into the skin daily. Patient taking differently: Inject 20 Units into the skin daily.  03/19/19  Yes Wieting, Richard, MD  lactobacillus (FLORANEX/LACTINEX) PACK Take 1 packet (1 g total) by mouth 3 (three)  times daily with meals. 03/19/19  Yes Wieting, Richard, MD  metoprolol succinate (TOPROL-XL) 25 MG 24 hr tablet Take 1 tablet (25 mg total) by mouth daily. Patient taking differently: Take 37.5 mg by mouth daily.  03/19/19  Yes Wieting, Richard, MD  Multiple Vitamins-Minerals (MULTIVITAMIN WITH MINERALS) tablet Take 1 tablet by mouth daily.   Yes [provider]  nitroGLYCERIN (NITROSTAT) 0.4 MG SL tablet Place 0.4 mg under the tongue every 5 (five) minutes as needed for chest pain (MAX 3 TABLETS).    Yes [provider]  Nutritional Supplements (FEEDING SUPPLEMENT, NEPRO CARB STEADY,) LIQD Take 237 mLs by mouth 2 (two) times daily between meals. 03/19/19  Yes Wieting, Richard, MD  omeprazole (PRILOSEC) 20 MG capsule Take 20 mg by mouth daily.    Yes [provider]   potassium chloride SA (K-DUR) 20 MEQ tablet Take 1 tablet (20 mEq total) by mouth daily. 04/09/19  Yes Gollan, Kathlene November, MD  sodium hypochlorite (DAKIN'S 1/2 STRENGTH) external solution Irrigate with 1 application as directed 2 (two) times daily. (apply to sacral wound)   Yes [provider]  tamsulosin (FLOMAX) 0.4 MG CAPS capsule Take 1 capsule (0.4 mg total) by mouth daily after breakfast. 03/20/19  Yes Wieting, Richard, MD  vitamin C (ASCORBIC ACID) 500 MG tablet Take 500 mg by mouth 2 (two) times daily.   Yes [provider]  XARELTO 20 MG TABS tablet TAKE 1 TABLET BY MOUTH EVERY DAY WITH LUNCH Patient taking differently: Take 20 mg by mouth daily.  11/23/18  Yes Minna Merritts, MD  niacin (NIASPAN) 1000 MG CR tablet Take 1,000 mg by mouth daily. 04/10/19   [provider]     Allergies Iodine, Shrimp [shellfish allergy], and Tetracycline  Family History  Problem Relation Age of Onset  . Breast cancer Mother   . Heart disease Father   . Heart attack Father   . Colon cancer Neg Hx     Social History Social History   Tobacco Use  . Smoking status: Never Smoker  . Smokeless tobacco: Never Used  Substance Use Topics  . Alcohol use: No  . Drug use: No    Review of Systems  Constitutional: No fever/chills Eyes: No visual changes.  ENT: No sore throat. Cardiovascular: Denies chest pain. Respiratory: Shortness of breath. Gastrointestinal: No abdominal pain.  No nausea, no vomiting.   Genitourinary: Negative for dysuria. Musculoskeletal: Negative for back pain. Skin: Decubitus ulcer as above Neurological: Negative for headaches or weakness   ____________________________________________   PHYSICAL EXAM:  VITAL SIGNS: ED Triage Vitals  Enc Vitals Group     BP 04/21/19 1130 107/80     Pulse Rate 04/21/19 1056 (!) 111     Resp 04/21/19 1056 (!) 23     Temp 04/21/19 1056 97.7 F (36.5 C)     Temp Source 04/21/19 1056 Oral     SpO2 04/21/19  1056 95 %     Weight --      Height --      Head Circumference --      Peak Flow --      Pain Score 04/21/19 1058 0     Pain Loc --      Pain Edu? --      Excl. in [place]? --     Constitutional: Alert and oriented.  .   Nose: No congestion/rhinnorhea. Mouth/Throat: Mucous membranes are moist.    Cardiovascular: Tachycardia, irregular rhythm. Grossly normal heart sounds.  Good peripheral circulation. Respiratory: Increased respiratory effort, bibasilar [place] Gastrointestinal: Soft and nontender. No distention.    Musculoskeletal:  Warm and well perfused Neurologic:  Normal speech and language. No gross focal neurologic deficits are appreciated.  Skin:  Skin is warm, dry.  Large foul-smelling decubitus ulcer, packed with gauze, no current bleeding Psychiatric: Mood and affect are normal. Speech and behavior are normal.  ____________________________________________   LABS (all labs ordered are listed, but only abnormal results are displayed)  Labs Reviewed  CBC - Abnormal; Notable for the following components:      Result Value   WBC 18.5 (*)    RBC 2.73 (*)    Hemoglobin 7.0 (*)    HCT 23.8 (*)    MCH 25.6 (*)    MCHC 29.4 (*)    RDW 18.7 (*)    Platelets 476 (*)    All other components within normal limits  COMPREHENSIVE METABOLIC PANEL - Abnormal; Notable for the following components:   Potassium 5.5 (*)    CO2 21 (*)    Glucose, Bld 140 (*)    BUN 25 (*)    Calcium 8.3 (*)    Total Protein 5.7 (*)    Albumin 1.8 (*)    Alkaline Phosphatase 211 (*)    All other components within normal limits  APTT - Abnormal; Notable for the following components:   aPTT 49 (*)    All other components within normal limits  PROTIME-INR - Abnormal; Notable for the following components:   Prothrombin Time 36.5 (*)    INR 3.8 (*)    All other components within normal limits  CULTURE, BLOOD (ROUTINE X 2)  CULTURE, BLOOD (ROUTINE X 2)  SARS CORONAVIRUS 2 (HOSPITAL ORDER, [place] LAB)  LACTIC ACID, PLASMA  TYPE AND SCREEN   ____________________________________________  EKG   ____________________________________________  RADIOLOGY  Chest x-ray, right-sided opacity possible effusion ____________________________________________   PROCEDURES  Procedure(s) performed: No  Procedures   Critical Care performed: yes  CRITICAL CARE Performed by: Lavonia Drafts   Total critical care time: 30 minutes  Critical care time was exclusive of separately billable procedures and treating other patients.  Critical care was necessary to treat or prevent imminent or life-threatening deterioration.  Critical care was time spent personally by me on the following activities: development of treatment plan with patient and/or surrogate as well as nursing, discussions with consultants, evaluation of patient's response to treatment, examination of patient, obtaining history from patient or surrogate, ordering and performing treatments and interventions, ordering and review of laboratory studies, ordering and review of radiographic studies, pulse oximetry and re-evaluation of patient's condition.  ____________________________________________   INITIAL IMPRESSION / ASSESSMENT AND PLAN / ED COURSE  Pertinent labs & imaging results that were available during my care of the patient were reviewed by me and considered in my medical decision making (see chart for details).  Patient presents with shortness of breath, reports of bleeding from decubitus ulcer.  He is on Xarelto.  Only minimal bleeding at this time from ulcer however hemoglobin shows 2.5 g drop, he also continues to have significantly elevated white blood cell count with increased work of breathing and tachycardia suspicious for recurrence of pneumonia.  Will cover with broad-spectrum antibiotics, type and screen ordered.  Not consistent with COVID-19, will send rapid tests in case the patient has  to go to the operating room  ----------------------------------------- 1:00 PM on 04/21/2019 ----------------------------------------- Consulted general surgery Dr. Peyton Najjar  -----------------------------------------  1:15 PM on 04/21/2019 -----------------------------------------  Will admit to medical service, discussed concern of bleeding decubitus ulcer with Dr. Bridgett Larsson     ____________________________________________   FINAL CLINICAL IMPRESSION(S) / ED DIAGNOSES  Final diagnoses:  Sepsis, due to unspecified organism, unspecified whether acute organ dysfunction present ([place])  Pressure injury of sacral region, unstageable [facility])        Note:  This document was prepared using Dragon voice recognition software and may include unintentional dictation errors.   Lavonia Drafts, MD 04/21/19 [phone]

## 2019-04-21 NOTE — H&P (Addendum)
[facility] at [place] NAME: Fulton Merry    MR#:  [id number]  DATE OF BIRTH:  16-Dec-1950  DATE OF ADMISSION:  04/21/2019  PRIMARY CARE PHYSICIAN: Toni Arthurs, NP   REQUESTING/REFERRING PHYSICIAN: Dr. Corky Downs.  CHIEF COMPLAINT:   Chief Complaint  Patient presents with  . Shortness of Breath   Shortness breath and cough HISTORY OF PRESENT ILLNESS:  Colton Lamb  is a 68 y.o. male with a known history of Multiple medical problems as below. The patient was sent from nursing home to ED due to above chief complaints.  The patient denies any fever or chills but has shortness of breath and the cough.  He was recently admitted for pneumonia and empyema.  He developed sacral DU in the hospital and was sent to nursing facility after discharge.  Per Dr. Corky Downs, the patient also has sacral wound bleeding for the past few days.  Hemoglobin decreased from 8.9 to7.0.  Dr. Corky Downs checked her wound which has has minimal bleeding in the ED.  The patient is on Xarelto for A. fib.  Chest x-ray: right lower lobe opacity.  The patient is found tachycardia, leukocytosis and lactic acidosis.  He is being treated with antibiotics for sepsis.  Dr. Corky Downs discussed with on-call surgeon Dr. Berenda Morale, who will see the patient for sacral wound. PAST MEDICAL HISTORY:   Past Medical History:  Diagnosis Date  . Atrial flutter ([place])    a. s/p Cardioversion 11/22/13, on amiodarone and Xarelto.  . Chronic systolic heart failure ([place])    a. 10/2013 EF 20-25%, grade III DD, RV mildly dilated and sys fx mild/mod reduced;  b. 01/2014 Echo: EF 30-35%, gr3 DD, mod dil LA.  Marland Kitchen Coronary artery disease    a. s/p MI 2007/2015;  b. s/p prior PCI to the LAD/LCX/PDA/PL;  c. 2008: s/p Cypher DES to the OM.  Marland Kitchen Crohn's ileocolitis ([place])   . GERD (gastroesophageal reflux disease)   . Hx of adenomatous colonic polyps 11/2003  . Hyperlipidemia   . Hypertension   . Ischemic  cardiomyopathy    s. 01/2014 s/p MDT [id number] [name] XT DR single lead AICD.  Marland Kitchen Obesity   . Paroxysmal atrial fibrillation (H[facility])    a. CHA2DS2VASc = 4-->xarelto/amio.  . Sleep apnea   . Syncope    a.  11/2013 in setting of volume depletion and bradycardia due to dig toxicity   . Type II diabetes mellitus ([place])     PAST SURGICAL HISTORY:   Past Surgical History:  Procedure Laterality Date  . ATRIAL FLUTTER ABLATION N/A 04/16/2014   Procedure: ATRIAL FLUTTER ABLATION;  Surgeon: Evans Lance, MD;  Location[facility] CATH LAB;  Service: Cardiovascular;  Laterality: N/A;  . CARDIAC CATHETERIZATION  10/2013  . CARDIAC DEFIBRILLATOR PLACEMENT  04/16/2014   Medtronic Evira device  . CARDIAC ELECTROPHYSIOLOGY STUDY AND ABLATION  04/16/2014   atrial flutter ablation  . CARDIOVERSION N/A 03/05/2014   Procedure: CARDIOVERSION;  Surgeon: Jolaine Artist, MD;  Location: [facility] ENDOSCOPY;  Service: Cardiovascular;  Laterality: N/A;  . CATARACT EXTRACTION W/PHACO Right 01/04/2017   Procedure: CATARACT EXTRACTION PHACO AND INTRAOCULAR LENS PLACEMENT ([place])  Right Diabetic Complicated;  Surgeon: Leandrew Koyanagi, MD;  Location: [facility];  Service: Ophthalmology;  Laterality: Right;  Diabetic  . CATARACT EXTRACTION W/PHACO Left 02/08/2017   Procedure: CATARACT EXTRACTION PHACO AND INTRAOCULAR LENS PLACEMENT ([place]) left diabetic;  Surgeon: Leandrew Koyanagi, MD;  Location: [facility];  Service: Ophthalmology;  Laterality: Left;  Diabetic - oral meds sleep apnea  . CORONARY ANGIOPLASTY WITH STENT PLACEMENT  2007; 2008 X 2   "1+1 ~ 1"  . FOOT SURGERY Left    bone spur  . HYDROCELE EXCISION Bilateral   . 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 IMPLANTABLE CARDIOVERTER DEFIBRILLATOR IMPLANT N/A 04/16/2014   Procedure: IMPLANTABLE CARDIOVERTER DEFIBRILLATOR IMPLANT;  Surgeon: Evans Lance, MD;  Location: [facility] CATH LAB;  Service: Cardiovascular;  Laterality: N/A;  .  LEFT HEART CATHETERIZATION WITH CORONARY ANGIOGRAM N/A 11/22/2013   Procedure: LEFT HEART CATHETERIZATION WITH CORONARY ANGIOGRAM;  Surgeon: Sinclair Grooms, MD;  Location: [facility] CATH LAB;  Service: Cardiovascular;  Laterality: N/A;    SOCIAL HISTORY:   Social History   Tobacco Use  . Smoking status: Never Smoker  . Smokeless tobacco: Never Used  Substance Use Topics  . Alcohol use: No    FAMILY HISTORY:   Family History  Problem Relation Age of Onset  . Breast cancer Mother   . Heart disease Father   . Heart attack Father   . Colon cancer Neg Hx     DRUG ALLERGIES:   Allergies  Allergen Reactions  . Iodine Other (See Comments)    Shortness of breath, swelling and hives  . Shrimp [Shellfish Allergy] Other (See Comments)    SWELLING    HIVES    SHORTNESS OF BREATH  . Tetracycline Rash    REVIEW OF SYSTEMS:   Review of Systems  Constitutional: Negative for chills, fever and malaise/fatigue.  HENT: Negative for sore throat.   Eyes: Negative for blurred vision and double vision.  Respiratory: Positive for cough and shortness of breath. Negative for hemoptysis, sputum production, wheezing and stridor.   Cardiovascular: Negative for chest pain, palpitations, orthopnea and leg swelling.  Gastrointestinal: Negative for abdominal pain, blood in stool, diarrhea, melena, nausea and vomiting.  Genitourinary: Negative for dysuria, flank pain and hematuria.  Musculoskeletal: Negative for back pain and joint pain.  Skin:       Sacral wound bleeding.  Neurological: Negative for dizziness, sensory change, focal weakness, seizures, loss of consciousness, weakness and headaches.  Endo/Heme/Allergies: Negative for polydipsia.  Psychiatric/Behavioral: Negative for depression. The patient is not nervous/anxious.     MEDICATIONS AT HOME:   Prior to Admission medications   Medication Sig Start Date End Date Taking? Authorizing Provider  acetaminophen (TYLENOL) 325 MG tablet Take 1-2  tablets (325-650 mg total) by mouth every 4 (four) hours as needed for mild pain. 04/17/14  Yes [name], NP  Albuterol Sulfate 108 (90 Base) MCG/ACT AEPB Inhale 1 puff into the lungs every 6 (six) hours as needed (shortness of breath).    Yes [provider]  amiodarone (PACERONE) 200 MG tablet Take 1 tablet (200 mg total) by mouth daily. 03/20/19  Yes Wieting, Richard, MD  aspirin 81 MG tablet Take 81 mg by mouth daily.    Yes [provider]  atorvastatin (LIPITOR) 80 MG tablet TAKE 1 TABLET BY MOUTH EVERY DAY Patient taking differently: Take 80 mg by mouth daily.  03/08/19  Yes Gollan, Kathlene November, MD  balsalazide (COLAZAL) 750 MG capsule TAKE 1 CAPSULE (750 MG TOTAL) BY MOUTH 3 (THREE) TIMES DAILY. Patient taking differently: Take 750 mg by mouth 3 (three) times daily.  11/28/18  Yes Ladene Artist, MD  finasteride (PROSCAR) 5 MG tablet Take 1 tablet (5 mg total) by mouth daily. 03/20/19  Yes Loletha Grayer, MD  insulin aspart (NOVOLOG) 100 UNIT/ML injection Inject 5 Units into the skin 3 (three) times daily with meals. Patient taking differently: Inject 6 Units into the skin 3 (three) times daily before meals.  03/19/19  Yes Wieting, Richard, MD  insulin detemir (LEVEMIR) 100 UNIT/ML injection Inject 0.13 mLs (13 Units total) into the skin daily. Patient taking differently: Inject 20 Units into the skin daily.  03/19/19  Yes Wieting, Richard, MD  lactobacillus (FLORANEX/LACTINEX) PACK Take 1 packet (1 g total) by mouth 3 (three) times daily with meals. 03/19/19  Yes Wieting, Richard, MD  metoprolol succinate (TOPROL-XL) 25 MG 24 hr tablet Take 1 tablet (25 mg total) by mouth daily. Patient taking differently: Take 37.5 mg by mouth daily.  03/19/19  Yes Wieting, Richard, MD  Multiple Vitamins-Minerals (MULTIVITAMIN WITH MINERALS) tablet Take 1 tablet by mouth daily.   Yes [provider]  nitroGLYCERIN (NITROSTAT) 0.4 MG SL tablet Place 0.4 mg under the tongue every 5  (five) minutes as needed for chest pain (MAX 3 TABLETS).    Yes [provider]  Nutritional Supplements (FEEDING SUPPLEMENT, NEPRO CARB STEADY,) LIQD Take 237 mLs by mouth 2 (two) times daily between meals. 03/19/19  Yes Wieting, Richard, MD  omeprazole (PRILOSEC) 20 MG capsule Take 20 mg by mouth daily.    Yes [provider]  potassium chloride SA (K-DUR) 20 MEQ tablet Take 1 tablet (20 mEq total) by mouth daily. 04/09/19  Yes Gollan, Kathlene November, MD  sodium hypochlorite (DAKIN'S 1/2 STRENGTH) external solution Irrigate with 1 application as directed 2 (two) times daily. (apply to sacral wound)   Yes [provider]  tamsulosin (FLOMAX) 0.4 MG CAPS capsule Take 1 capsule (0.4 mg total) by mouth daily after breakfast. 03/20/19  Yes Wieting, Richard, MD  vitamin C (ASCORBIC ACID) 500 MG tablet Take 500 mg by mouth 2 (two) times daily.   Yes [provider]  XARELTO 20 MG TABS tablet TAKE 1 TABLET BY MOUTH EVERY DAY WITH LUNCH Patient taking differently: Take 20 mg by mouth daily.  11/23/18  Yes Minna Merritts, MD  niacin (NIASPAN) 1000 MG CR tablet Take 1,000 mg by mouth daily. 04/10/19   [provider]      VITAL SIGNS:  Blood pressure (!) 126/108, pulse (!) 112, temperature 97.7 F (36.5 C), temperature source Oral, resp. rate (!) 25, SpO2 99 %.  PHYSICAL EXAMINATION:  Physical Exam  GENERAL:  68 y.o.-year-old patient lying in the bed with no acute distress.  Obesity. EYES: Pupils equal, round, reactive to light and accommodation. No scleral icterus. Extraocular muscles intact.  HEENT: Head atraumatic, normocephalic. Oropharynx and nasopharynx clear.  NECK:  Supple, no jugular venous distention. No thyroid enlargement, no tenderness.  LUNGS: Normal breath sounds bilaterally, no wheezing, rales,rhonchi or crepitation. No use of accessory muscles of respiration.  CARDIOVASCULAR: S1, S2 normal. No murmurs, rubs, or gallops.  ABDOMEN: Soft, nontender,  nondistended. Bowel sounds present. No organomegaly or mass.  EXTREMITIES: No cyanosis, or clubbing bilateral leg edema 2+..  Sacral decub wound in dressing. NEUROLOGIC: Cranial nerves II through XII are intact. Muscle strength 4/5 in all extremities. Sensation intact. Gait not checked.  PSYCHIATRIC: The patient is alert and oriented x 3.  SKIN: No obvious rash, lesion, or ulcer.   LABORATORY PANEL:   CBC Recent Labs  Lab 04/21/19 1109  WBC 18.5*  HGB 7.0*  HCT 23.8*  PLT 476*   ------------------------------------------------------------------------------------------------------------------  Chemistries  Recent Labs  Lab 04/21/19 1109  NA 137  K 5.5*  CL 105  CO2 21*  GLUCOSE 140*  BUN 25*  CREATININE 1.07  CALCIUM 8.3*  AST 28  ALT 13  ALKPHOS 211*  BILITOT 0.6   ------------------------------------------------------------------------------------------------------------------  Cardiac Enzymes No results for input(s): TROPONINI in the last 168 hours. ------------------------------------------------------------------------------------------------------------------  RADIOLOGY:  Dg Chest Port 1 View  Result Date: 04/21/2019 CLINICAL DATA:  Shortness of breath EXAM: PORTABLE CHEST 1 VIEW COMPARISON:  03/18/2019 FINDINGS: Right lower lobe opacity, likely reflecting a combination of atelectasis and moderate right pleural effusion, mildly progressive. Left lung is clear. No frank interstitial edema. No pneumothorax. Cardiomegaly.  Left subclavian ICD. IMPRESSION: Right lower lobe opacity, likely reflecting combination of atelectasis and moderate right pleural effusion, mildly progressive. Electronically Signed   By: Julian Hy M.D.   On: 04/21/2019 11:40      IMPRESSION AND PLAN:   Sepsis due to pneumonia HAP. The patient will be admitted to medical floor. Continue cefepime and vancomycin, follow-up CBC and cultures.  Lactic acidosis due to above.  Treatment  as above, IV fluid support and follow-up lactic acid level.  Sacral decub wound bleeding. Follow-up of surgical consult, wound care.  Anemia of chronic disease and due to active blood loss. Hold Xarelto, PRBC transfusion 1 unit.  Follow-up hemoglobin.  Hypotension, Blood pressure is low at 83/60.  Normal saline bolus for now.  Monitor vital sign. Per ICU charge nurse, no ICU bed available for now.  Hyperkalemia.  Gentle IV fluid support, hold potassium supplement, follow-up potassium level.  History of A. fib and CAD.  Hold Xarelto due to bleeding and anemia, continue amiodarone. Chronic systolic CHF with ejection fraction 35%.  Stable at this time. Diabetes.  Continue Levemir and NovoLog AC, start sliding scale.  All the records are reviewed and case discussed with ED provider. Management plans discussed with the patient, family and they are in agreement.  CODE STATUS: Full code.  TOTAL CRITICAL TIME TAKING CARE OF THIS PATIENT: 66 minutes.    Demetrios Loll M.D on 04/21/2019 at 1:23 PM  Between 7am to 6pm - Pager - [phone]  After 6pm go to [email or web address] - Proofreader  [facility]  Office  [phone]  CC: Primary care physician; Toni Arthurs, NP   Note: This dictation was prepared with Dragon dictation along with smaller phrase technology. Any transcriptional errors that result from this process are unin

## 2019-04-22 DIAGNOSIS — D62 Acute posthemorrhagic anemia: Secondary | ICD-10-CM

## 2019-04-22 LAB — CBC
HCT: 21.5 % — ABNORMAL LOW (ref 39.0–52.0)
Hemoglobin: 6.6 g/dL — ABNORMAL LOW (ref 13.0–17.0)
MCH: 26 pg (ref 26.0–34.0)
MCHC: 30.7 g/dL (ref 30.0–36.0)
MCV: 84.6 fL (ref 80.0–100.0)
Platelets: 272 10*3/uL (ref 150–400)
RBC: 2.54 MIL/uL — ABNORMAL LOW (ref 4.22–5.81)
RDW: 18.2 % — ABNORMAL HIGH (ref 11.5–15.5)
WBC: 10.4 10*3/uL (ref 4.0–10.5)
nRBC: 0 % (ref 0.0–0.2)

## 2019-04-22 LAB — BASIC METABOLIC PANEL
Anion gap: 10 (ref 5–15)
BUN: 26 mg/dL — ABNORMAL HIGH (ref 8–23)
CO2: 19 mmol/L — ABNORMAL LOW (ref 22–32)
Calcium: 7.7 mg/dL — ABNORMAL LOW (ref 8.9–10.3)
Chloride: 106 mmol/L (ref 98–111)
Creatinine, Ser: 1.09 mg/dL (ref 0.61–1.24)
GFR calc Af Amer: >60 mL/min (ref >60)
GFR calc non Af Amer: >60 mL/min (ref >60)
Glucose, Bld: 116 mg/dL — ABNORMAL HIGH (ref 70–99)
Potassium: 4.6 mmol/L (ref 3.5–5.1)
Sodium: 135 mmol/L (ref 135–145)

## 2019-04-22 LAB — GLUCOSE, CAPILLARY
Glucose-Capillary: 109 mg/dL — ABNORMAL HIGH (ref 70–99)
Glucose-Capillary: 118 mg/dL — ABNORMAL HIGH (ref 70–99)
Glucose-Capillary: 70 mg/dL (ref 70–99)
Glucose-Capillary: 81 mg/dL (ref 70–99)
Glucose-Capillary: 128 mg/dL — ABNORMAL HIGH (ref 70–99)

## 2019-04-22 LAB — PREPARE RBC (CROSSMATCH)

## 2019-04-22 LAB — PROCALCITONIN: Procalcitonin: 0.56 ng/mL

## 2019-04-22 LAB — HEMOGLOBIN AND HEMATOCRIT, BLOOD
HCT: 26.2 % — ABNORMAL LOW (ref 39.0–52.0)
Hemoglobin: 8.1 g/dL — ABNORMAL LOW (ref 13.0–17.0)

## 2019-04-22 MED ORDER — OCUVITE-LUTEIN PO CAPS
1.0000 | ORAL_CAPSULE | Freq: Every day | ORAL | Status: DC
  Administered 2019-04-23 – 2019-04-26 (×3): 1 via ORAL
  Filled 2019-04-22 (×5): qty 1

## 2019-04-22 MED ORDER — METRONIDAZOLE IN NACL 5-0.79 MG/ML-% IV SOLN
500.0000 mg | Freq: Three times a day (TID) | INTRAVENOUS | Status: DC
  Administered 2019-04-22 – 2019-04-26 (×13): 500 mg via INTRAVENOUS
  Filled 2019-04-22 (×20): qty 100

## 2019-04-22 MED ORDER — NEPRO/CARBSTEADY PO LIQD
237.0000 mL | Freq: Three times a day (TID) | ORAL | Status: DC
  Administered 2019-04-22 – 2019-04-26 (×11): 237 mL via ORAL

## 2019-04-22 MED ORDER — VANCOMYCIN HCL IN DEXTROSE 1-5 GM/200ML-% IV SOLN
1000.0000 mg | Freq: Two times a day (BID) | INTRAVENOUS | Status: DC
  Administered 2019-04-22 – 2019-04-23 (×3): 1000 mg via INTRAVENOUS
  Filled 2019-04-22 (×5): qty 200

## 2019-04-22 NOTE — Progress Notes (Signed)
[facility] [place] NAME: Lion Fernandez    MR#:  [id number]  DATE OF BIRTH:  Dec 28, 1950  SUBJECTIVE:  Came in with bleeding of his chronic decubitus with foul smell  REVIEW OF SYSTEMS:   Review of Systems  Unable to perform ROS: Mental acuity   Tolerating Diet: Tolerating PT:   DRUG ALLERGIES:   Allergies  Allergen Reactions  . Iodine Other (See Comments)    Shortness of breath, swelling and hives  . Shrimp [Shellfish Allergy] Other (See Comments)    SWELLING    HIVES    SHORTNESS OF BREATH  . Tetracycline Rash    VITALS:  Blood pressure 93/79, pulse (!) 104, temperature 97.8 F (36.6 C), temperature source Axillary, resp. rate (!) 25, height 5' 11"  (1.803 m), weight 120.4 kg, SpO2 100 %.  PHYSICAL EXAMINATION:   Physical Exam  GENERAL:  68 y.o.-year-old patient lying in the bed with no acute distress. crtically ill EYES: Pupils equal, round, reactive to light and accommodation. No scleral icterus. Extraocular muscles intact.  HEENT: Head atraumatic, normocephalic. Oropharynx and nasopharynx clear.  NECK:  Supple, no jugular venous distention. No thyroid enlargement, no tenderness.  LUNGS: Normal breath sounds bilaterally, no wheezing, rales, rhonchi. No use of accessory muscles of respiration.  CARDIOVASCULAR: S1, S2 normal. No murmurs, rubs, or gallops.  ABDOMEN: Soft, nontender, nondistended. Bowel sounds present. No organomegaly or mass.  EXTREMITIES: No cyanosis, clubbing or edema b/l.    NEUROLOGIC: grossly non focal PSYCHIATRIC:  patient issleepy  SKIN:      LABORATORY PANEL:  CBC Recent Labs  Lab 04/22/19 0605  WBC 10.4  HGB 6.6*  HCT 21.5*  PLT 272    Chemistries  Recent Labs  Lab 04/21/19 2217 04/22/19 0605  NA 138 135  K 5.5* 4.6  CL 108 106  CO2 22 19*  GLUCOSE 132* 116*  BUN 26* 26*  CREATININE 1.06 1.09  CALCIUM 7.7* 7.7*  MG 1.9  --   AST 33  --   ALT 14  --   ALKPHOS 174*   --   BILITOT 0.8  --    Cardiac Enzymes No results for input(s): TROPONINI in the last 168 hours. RADIOLOGY:  Dg Chest Port 1 View  Result Date: 04/21/2019 CLINICAL DATA:  Shortness of breath EXAM: PORTABLE CHEST 1 VIEW COMPARISON:  03/18/2019 FINDINGS: Right lower lobe opacity, likely reflecting a combination of atelectasis and moderate right pleural effusion, mildly progressive. Left lung is clear. No frank interstitial edema. No pneumothorax. Cardiomegaly.  Left subclavian ICD. IMPRESSION: Right lower lobe opacity, likely reflecting combination of atelectasis and moderate right pleural effusion, mildly progressive. Electronically Signed   By: Julian Hy M.D.   On: 04/21/2019 11:40   ASSESSMENT AND PLAN:   68 year old male with a medical history chronic systolic congestive heart failure with EF of 20 to 25%, CAD, atrial fibrillation/flutter on currently on Xarelto for  complex medical history, currently a SNF resident, who presented to the ED for evaluation of bleeding from sacral ulcer.  Patient was recently hospitalized with severe pneumonia and discharged to SNF  1. Septic shock secondary to ?pneumonia and  infected decubitus ulcer with significant amount of bleeding causing acute blood loss anemia -continue IV vancomycin and cefepime -surgical consultation was obtained by Dr. Windell Moment and bedside debridement was done. Patient has extensive wound up to the bone. -Aggressive wound dressing -follow blood culture and trend antibiotics accordingly  2. History of  a flutter fib on Xarelto -holding Xarelto given significant bleeding from decubitus -continue metoprolol for rate control  3.Hyperkalemia--- Came in K of 5.5--4.6  4.Acute blood loss anemia due to significant bleeding frmom the decubitus -hgb 6.7---6.6--3 units BT -pt's INR is elevated due to xarelto  5.lactic acidosis due to infection -improving  Palliative care consultation placed   CODE STATUS: FULL  DVT  Prophylaxis: SCD  TOTAL TIME TAKING CARE OF THIS PATIENT: *30* minutes.  >50% time spent on counselling and coordination of care  POSSIBLE D/C IN *?* DAYS, DEPENDING ON CLINICAL CONDITION.  Note: This dictation was prepared with Dragon dictation along with smaller phrase technology. Any transcriptional errors that result from this process are unintentional.  [name] M.D on 04/22/2019 at 3:18 PM  Between 7am to 6pm - Pager - [phone]  After 6pm go to [email or web address] - password [email or web address] [facility]  Office  [phone]  CC: Primary care physician; Toni Arthurs, NPPatient ID: [name], male   DOB: 1951/06/25, 68 y.o.   MRN: [id number]

## 2019-04-22 NOTE — Progress Notes (Signed)
[facility] for Electrolyte Monitoring and Replacement   Recent Labs: Potassium (mmol/L)  Date Value  04/22/2019 4.6  11/22/2013 3.7   Magnesium (mg/dL)  Date Value  04/21/2019 1.9  11/22/2013 1.8   Calcium (mg/dL)  Date Value  04/22/2019 7.7 (L)   Calcium, Total (mg/dL)  Date Value  11/22/2013 9.0   Albumin (g/dL)  Date Value  04/21/2019 1.7 (L)  04/18/2018 4.3   Phosphorus (mg/dL)  Date Value  04/21/2019 4.8 (H)   Sodium (mmol/L)  Date Value  04/22/2019 135  05/09/2016 141  11/22/2013 133 (L)    Corrected Calcium: 9.54 mg/dL  Assessment: Colton Lamb is a 68 YO male admitted on 04/21/2019 with sepsis. Patient has history of atrial flutter, chronic systolic heart failure, hyperlipidemia, hypertension, paroxysmal atrial fibrillation, and T2DM. Patient has acute anemia associated with a sacral wound that's been bleeding the past few days.   Patient currently has 0.9% NaCl infusion running at 50 mL/hr.   Goal of Therapy:  Will keep K ~4, Mg ~2 in the setting of atrial fibrillation.   Plan:  No replacement warranted at this time.   Pharmacy will monitor and adjust per consult.  Irlene Crudup Dear Nicholes Mango, pharmacy student 04/22/2019 1:34 PM

## 2019-04-22 NOTE — Progress Notes (Signed)
PT Cancellation Note  Patient Details Name: Colton Lamb MRN: [id number] DOB: 1950-09-06   Cancelled Treatment:    Reason Eval/Treat Not Completed: Patient not medically ready.  PT consult received.  Chart reviewed.  Pt's last noted Hgb this morning was 6.6.  Per PT guidelines for low Hgb, therapy currently contra-indicated.  Will hold PT at this time and re-attempt PT evaluation at a later date/time as medically appropriate.  Leitha Bleak, PT 04/22/19, 1:26 PM [phone]

## 2019-04-22 NOTE — Progress Notes (Signed)
Patient has refused for staff to reposition him on to his side and elevate his heels this shift.  Patient educated on importance of offloading.

## 2019-04-22 NOTE — Progress Notes (Signed)
CRITICAL CARE PROGRESS NOTE    Name: Colton Lamb MRN: [id number] DOB: [birth year]     LOS: 1   SUBJECTIVE FINDINGS & SIGNIFICANT EVENTS   Patient description:   This is a 68 year old male with a medical history as indicated below, currently on Xarelto for history of flutter, complex medical history, currently a SNF resident, who presented to the ED for evaluation of bleeding from sacral ulcer.  Patient was recently hospitalized with severe pneumonia and discharged to SNF.  Since admission to SNF he reports that pressure ulcer he developed during hospitalization has gotten worse and over the last few days, bleeding had started.  He denies any pain.  His ED work-up showed a potassium of 5.5, lactic acid of 4.7, WBC of 18.5K and a hemoglobin of 7.0.  His PT INR was 36.5/3.8.  His blood pressures remained low with systolic blood pressures less than 80 despite fluid resuscitation.  He was started on pressors and admitted to the ICU for further management. Patient was seen by general surgery at bedside and found to have a large stage IV bleeding sacral ulcer that is complicated by hypercoagulable state with an INR that is greater than 3.  Also was debrided with abundant amount of clots and necrotic tissue extracted.  Sacral bone is visible. Patient was transfused 2 units of packed red blood cells but his hemoglobin continues to drop despite transfusion.   Lines / Drains: PIV  Cultures / Sepsis markers: Wound cx  Antibiotics: Vanc/cef/flag   Protocols / Consultants: Surgery  Wound care    PAST MEDICAL HISTORY   Past Medical History:  Diagnosis Date  . Atrial flutter ([place])    a. s/p Cardioversion 11/22/13, on amiodarone and Xarelto.  . Chronic systolic heart failure ([place])    a. 10/2013 EF 20-25%, grade III  DD, RV mildly dilated and sys fx mild/mod reduced;  b. 01/2014 Echo: EF 30-35%, gr3 DD, mod dil LA.  Marland Kitchen Coronary artery disease    a. s/p MI 2007/2015;  b. s/p prior PCI to the LAD/LCX/PDA/PL;  c. 2008: s/p Cypher DES to the OM.  Marland Kitchen Crohn's ileocolitis ([place])   . GERD (gastroesophageal reflux disease)   . Hx of adenomatous colonic polyps 11/2003  . Hyperlipidemia   . Hypertension   . Ischemic cardiomyopathy    s. 01/2014 s/p MDT [id number] [name] XT DR single lead AICD.  Marland Kitchen Obesity   . Paroxysmal atrial fibrillation ([facility])    a. CHA2DS2VASc = 4-->xarelto/amio.  . Sleep apnea   . Syncope    a.  11/2013 in setting of volume depletion and bradycardia due to dig toxicity   . Type II diabetes mellitus ([place])      SURGICAL HISTORY   Past Surgical History:  Procedure Laterality Date  . ATRIAL FLUTTER ABLATION N/A 04/16/2014   Procedure: ATRIAL FLUTTER ABLATION;  Surgeon: Evans Lance, MD;  Location: [facility] CATH LAB;  Service: Cardiovascular;  Laterality: N/A;  . CARDIAC CATHETERIZATION  10/2013  . CARDIAC DEFIBRILLATOR PLACEMENT  04/16/2014   Medtronic Evira device  . CARDIAC ELECTROPHYSIOLOGY STUDY AND ABLATION  04/16/2014   atrial flutter ablation  . CARDIOVERSION N/A 03/05/2014   Procedure: CARDIOVERSION;  Surgeon: Jolaine Artist, MD;  Location: [name] ENDOSCOPY;  Service: Cardiovascular;  Laterality: N/A;  . CATARACT EXTRACTION W/PHACO Right 01/04/2017   Procedure: CATARACT EXTRACTION PHACO AND INTRAOCULAR LENS PLACEMENT ([place])  Right Diabetic Complicated;  Surgeon: Leandrew Koyanagi, MD;  Location: [place];  Service: Ophthalmology;  Laterality:  Right;  Diabetic  . CATARACT EXTRACTION W/PHACO Left 02/08/2017   Procedure: CATARACT EXTRACTION PHACO AND INTRAOCULAR LENS PLACEMENT ([place]) left diabetic;  Surgeon: Leandrew Koyanagi, MD;  Location: [place];  Service: Ophthalmology;  Laterality: Left;  Diabetic - oral meds sleep apnea  . CORONARY ANGIOPLASTY WITH STENT PLACEMENT  2007;  2008 X 2   "1+1 ~ 1"  . FOOT SURGERY Left    bone spur  . HYDROCELE EXCISION Bilateral   . 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 IMPLANTABLE CARDIOVERTER DEFIBRILLATOR IMPLANT N/A 04/16/2014   Procedure: IMPLANTABLE CARDIOVERTER DEFIBRILLATOR IMPLANT;  Surgeon: Evans Lance, MD;  Location: [facility] CATH LAB;  Service: Cardiovascular;  Laterality: N/A;  . LEFT HEART CATHETERIZATION WITH CORONARY ANGIOGRAM N/A 11/22/2013   Procedure: LEFT HEART CATHETERIZATION WITH CORONARY ANGIOGRAM;  Surgeon: Sinclair Grooms, MD;  Location: [facility] CATH LAB;  Service: Cardiovascular;  Laterality: N/A;     FAMILY HISTORY   Family History  Problem Relation Age of Onset  . Breast cancer Mother   . Heart disease Father   . Heart attack Father   . Colon cancer Neg Hx      SOCIAL HISTORY   Social History   Tobacco Use  . Smoking status: Never Smoker  . Smokeless tobacco: Never Used  Substance Use Topics  . Alcohol use: No  . Drug use: No     MEDICATIONS   Current Medication:  Current Facility-Administered Medications:  .  0.9 %  sodium chloride infusion, , Intravenous, Continuous, Demetrios Loll, MD, Last Rate: 50 mL/hr at 04/22/19 0818 .  acetaminophen (TYLENOL) tablet 650 mg, 650 mg, Oral, Q6H PRN **OR** acetaminophen (TYLENOL) suppository 650 mg, 650 mg, Rectal, Q6H PRN, Demetrios Loll, MD .  albuterol (PROVENTIL) (2.5 MG/3ML) 0.083% nebulizer solution 2.5 mg, 2.5 mg, Nebulization, Q2H PRN, Demetrios Loll, MD .  amiodarone (PACERONE) tablet 200 mg, 200 mg, Oral, Daily, Demetrios Loll, MD, 200 mg at 04/22/19 [phone] .  aspirin chewable tablet 81 mg, 81 mg, Oral, Daily, Demetrios Loll, MD, 81 mg at 04/22/19 0954 .  atorvastatin (LIPITOR) tablet 80 mg, 80 mg, Oral, Daily, Ojie, Jude, MD, 80 mg at 04/22/19 0953 .  balsalazide (COLAZAL) capsule 750 mg, 750 mg, Oral, TID, Demetrios Loll, MD .  bisacodyl (DULCOLAX) EC tablet 5 mg, 5 mg, Oral, Daily PRN, Demetrios Loll, MD .  ceFEPIme (MAXIPIME) 2 g  in sodium chloride 0.9 % 100 mL IVPB, 2 g, Intravenous, Q8H, Benita Gutter, RPH, Stopped at 04/22/19 [phone] .  Chlorhexidine Gluconate Cloth 2 % PADS 6 each, 6 each, Topical, Daily, Tyler Pita, MD, 6 each at 04/22/19 [phone] .  feeding supplement (NEPRO CARB STEADY) liquid 237 mL, 237 mL, Oral, BID BM, Demetrios Loll, MD .  finasteride (PROSCAR) tablet 5 mg, 5 mg, Oral, Daily, Demetrios Loll, MD, 5 mg at 04/22/19 0951 .  HYDROcodone-acetaminophen (NORCO/VICODIN) 5-325 MG per tablet 1 tablet, 1 tablet, Oral, Q4H PRN, Demetrios Loll, MD .  insulin aspart (novoLOG) injection 0-5 Units, 0-5 Units, Subcutaneous, QHS, Demetrios Loll, MD .  insulin aspart (novoLOG) injection 0-9 Units, 0-9 Units, Subcutaneous, TID WC, Demetrios Loll, MD .  insulin aspart (novoLOG) injection 6 Units, 6 Units, Subcutaneous, TID Otho Perl, MD, 6 Units at 04/22/19 [phone] .  insulin detemir (LEVEMIR) injection 20 Units, 20 Units, Subcutaneous, Daily, Demetrios Loll, MD, 20 Units at 04/22/19 [phone] .  lactobacillus (FLORANEX/LACTINEX) granules 1 g, 1 g, Oral, TID WC, Demetrios Loll, MD,  1 g at 04/22/19 0748 .  metoprolol succinate (TOPROL-XL) 24 hr tablet 37.5 mg, 37.5 mg, Oral, Daily, Demetrios Loll, MD, 37.5 mg at 04/22/19 0949 .  metroNIDAZOLE (FLAGYL) IVPB 500 mg, 500 mg, Intravenous, Q8H, Tukov-Yual, Magdalene S, NP .  morphine 4 MG/ML injection 4 mg, 4 mg, Intravenous, Q4H PRN, Demetrios Loll, MD .  multivitamin with minerals tablet 1 tablet, 1 tablet, Oral, Daily, Demetrios Loll, MD, 1 tablet at 04/22/19 [phone] .  niacin (NIASPAN) CR tablet 1,000 mg, 1,000 mg, Oral, Daily, Demetrios Loll, MD, 1,000 mg at 04/22/19 0949 .  nitroGLYCERIN (NITROSTAT) SL tablet 0.4 mg, 0.4 mg, Sublingual, Q5 min PRN, Demetrios Loll, MD .  ondansetron [facility]) tablet 4 mg, 4 mg, Oral, Q6H PRN **OR** ondansetron (ZOFRAN) injection 4 mg, 4 mg, Intravenous, Q6H PRN, Demetrios Loll, MD .  pantoprazole (PROTONIX) EC tablet 40 mg, 40 mg, Oral, Daily, Demetrios Loll, MD, 40 mg at 04/22/19 [phone] .   senna-docusate (Senokot-S) tablet 1 tablet, 1 tablet, Oral, QHS PRN, Demetrios Loll, MD .  tamsulosin [facility]) capsule 0.4 mg, 0.4 mg, Oral, QPC breakfast, Demetrios Loll, MD, 0.4 mg at 04/22/19 0953 .  vancomycin (VANCOCIN) IVPB 1000 mg/200 mL premix, 1,000 mg, Intravenous, Q12H, Charlett Nose, RPH .  vitamin C (ASCORBIC ACID) tablet 500 mg, 500 mg, Oral, BID, Demetrios Loll, MD, 500 mg at 04/22/19 [phone]    ALLERGIES   Iodine, Shrimp [shellfish allergy], and Tetracycline    REVIEW OF SYSTEMS    10 point ROS neg except as per subj findings.  PHYSICAL EXAMINATION   Vital Signs: Temp:  [97.3 F (36.3 C)-99.3 F (37.4 C)] 97.6 F (36.4 C) (09/21 0911) Pulse Rate:  [91-119] 108 (09/21 0911) Resp:  [13-33] 24 (09/21 0911) BP: (77-126)/(42-108) 106/76 (09/21 0911) SpO2:  [91 %-100 %] 98 % (09/21 0911) Weight:  [120.4 kg-122.5 kg] 120.4 kg (09/20 1857)  GENERAL:NAD HEAD: Normocephalic, atraumatic.  EYES: Pupils equal, round, reactive to light.  No scleral icterus.  MOUTH: Moist mucosal membrane. NECK: Supple. No thyromegaly. No nodules. No JVD.  PULMONARY: ctab CARDIOVASCULAR: S1 and S2. Regular rate and rhythm. No murmurs, rubs, or gallops.  GASTROINTESTINAL: Soft, nontender, non-distended. No masses. Positive bowel sounds. No hepatosplenomegaly.  MUSCULOSKELETAL: No swelling, clubbing, or edema.  NEUROLOGIC: Mild distress due to acute illness SKIN:intact,warm,dry   PERTINENT DATA     Infusions: . sodium chloride 50 mL/hr at 04/22/19 0818  . ceFEPime (MAXIPIME) IV Stopped (04/22/19 0254)  . metronidazole    . vancomycin     Scheduled Medications: . amiodarone  200 mg Oral Daily  . aspirin  81 mg Oral Daily  . atorvastatin  80 mg Oral Daily  . balsalazide  750 mg Oral TID  . Chlorhexidine Gluconate Cloth  6 each Topical Daily  . feeding supplement (NEPRO CARB STEADY)  237 mL Oral BID BM  . finasteride  5 mg Oral Daily  . insulin aspart  0-5 Units Subcutaneous QHS  .  insulin aspart  0-9 Units Subcutaneous TID WC  . insulin aspart  6 Units Subcutaneous TID AC  . insulin detemir  20 Units Subcutaneous Daily  . lactobacillus  1 g Oral TID WC  . metoprolol succinate  37.5 mg Oral Daily  . multivitamin with minerals  1 tablet Oral Daily  . niacin  1,000 mg Oral Daily  . pantoprazole  40 mg Oral Daily  . tamsulosin  0.4 mg Oral QPC breakfast  . vitamin C  500 mg Oral BID  PRN Medications: acetaminophen **OR** acetaminophen, albuterol, bisacodyl, HYDROcodone-acetaminophen, morphine injection, nitroGLYCERIN, ondansetron **OR** ondansetron (ZOFRAN) IV, senna-docusate Hemodynamic parameters:   Intake/Output: 09/20 0701 - 09/21 0700 In: 3055.9 [I.V.:352.6; Blood:1952; IV Piggyback:751.4] Out: 150 [Urine:150]  Ventilator  Settings:      LAB RESULTS:  Basic Metabolic Panel: Recent Labs  Lab 04/21/19 1109 04/21/19 2217 04/22/19 0605  NA 137 138 135  K 5.5* 5.5* 4.6  CL 105 108 106  CO2 21* 22 19*  GLUCOSE 140* 132* 116*  BUN 25* 26* 26*  CREATININE 1.07 1.06 1.09  CALCIUM 8.3* 7.7* 7.7*  MG  --  1.9  --   PHOS  --  4.8*  --    Liver Function Tests: Recent Labs  Lab 04/21/19 1109 04/21/19 2217  AST 28 33  ALT 13 14  ALKPHOS 211* 174*  BILITOT 0.6 0.8  PROT 5.7* 5.1*  ALBUMIN 1.8* 1.7*   No results for input(s): LIPASE, AMYLASE in the last 168 hours. No results for input(s): AMMONIA in the last 168 hours. CBC: Recent Labs  Lab 04/21/19 1109 04/21/19 2217 04/22/19 0605  WBC 18.5* 13.6* 10.4  NEUTROABS  --  11.6*  --   HGB 7.0* 6.7* 6.6*  HCT 23.8* 21.7* 21.5*  MCV 87.2 85.8 84.6  PLT 476* 357 272   Cardiac Enzymes: No results for input(s): CKTOTAL, CKMB, CKMBINDEX, TROPONINI in the last 168 hours. BNP: Invalid input(s): POCBNP CBG: Recent Labs  Lab 04/21/19 1856 04/21/19 2213 04/22/19 0730  GLUCAP 106* 120* 109*     IMAGING RESULTS:  Imaging: Dg Chest Port 1 View  Result Date: 04/21/2019 CLINICAL DATA:   Shortness of breath EXAM: PORTABLE CHEST 1 VIEW COMPARISON:  03/18/2019 FINDINGS: Right lower lobe opacity, likely reflecting a combination of atelectasis and moderate right pleural effusion, mildly progressive. Left lung is clear. No frank interstitial edema. No pneumothorax. Cardiomegaly.  Left subclavian ICD. IMPRESSION: Right lower lobe opacity, likely reflecting combination of atelectasis and moderate right pleural effusion, mildly progressive. Electronically Signed   By: Julian Hy M.D.   On: 04/21/2019 11:40      ASSESSMENT AND PLAN    -Multidisciplinary rounds held today  Acute blood loss anemia - due to supratherapeutic INR in context of novel anticoagulant use and severe bleeding sacral wound - s/p surgical debridement - appreciate collaboration - patient reports clinical improvement.  - s/p 3 units PRBc transfusion  -ICU monitoring  - h/h q6h   Large necrotic stageIV Sacral wound 9X10X4cm with 4 cm undermining to wound edges - s/p surgical debridement - appreciate collaboration  - wound care consultation -appreciate input - may require further sharp debridement -oxygen as needed -Lasix as tolerated -follow up cardiac enzymes as indicated ICU monitoring    Atrial fibrillation   --SCD   ID -continue IV abx as prescibed-cefepime/vanc -follow up cultures  GI/Nutrition GI PROPHYLAXIS as indicated DIET-->TF's as tolerated Constipation protocol as indicated  ENDO - ICU hypoglycemic\Hyperglycemia protocol -check FSBS per protocol   ELECTROLYTES -follow labs as needed -replace as needed -pharmacy consultation   DVT/GI PRX ordered -SCDs  TRANSFUSIONS AS NEEDED MONITOR FSBS ASSESS the need for LABS as needed   Critical care provider statement:    Critical care time (minutes):  32   Critical care time was exclusive of:  Separately billable procedures and treating other patients   Critical care was necessary to treat or prevent imminent or  life-threatening deterioration of the following conditions:  acute blood loss anemia, large necrotic sacral wound,  s/p debridement, AF, multiple comorbid conditions.    Critical care was time spent personally by me on the following activities:  Development of treatment plan with patient or surrogate, discussions with consultants, evaluation of patient's response to treatment, examination of patient, obtaining history from patient or surrogate, ordering and performing treatments and interventions, ordering and review of laboratory studies and re-evaluation of patient's condition.  I assumed direction of critical care for this patient from another provider in my specialty: no    This document was prepared using Dragon voice recognition software and may include unintentional dictation errors.    Ottie Glazier, M.D.  Division of [facility]

## 2019-04-22 NOTE — Consult Note (Addendum)
[facility] Nurse wound consult note Surgical team following for assessment and plan of care for sacrum wound.  Dr Windell Moment performed a bedside debridement last night of nonviable tissue; refer to the photo in the EMR.  Reason for Consult: Consult requested to assess for possible topical treatment recommendations. Wound type: Stage 4 pressure injury to sacrum; bone visible.  No further active bleeding noted at this time. Pressure Injury POA: Yes Measurement: 9X10X4cm with 4 cm undermining to wound edges Wound bed: 30% red, 70% grey-brown wound bed.  Large amt brown foul-smelling drainage Periwound: Intact skin surrounding.  Pt is frequently incontinent of stool and it is difficult to keep wound from becoming soiled, related to the close proximity to the rectum.  Dressing procedure/placement/frequency: Agree with surgical recommendation of moist gauze packing to assist with removal of nonviable tissue.  This wound will not be appropriate for Vac therapy related to the extensive amount of nonviable tissue.  Pt is on a low air loss mattress to reduce pressure. He could benefit from follow-up at the outpatient wound care center after discharge and may require further sharp debridement sessions to remove further necrotic tissue remaining.  Please refer to surgical team for further plan of care. Please re-consult if further assistance is needed.  Thank-you,  Julien Girt MSN, [place], [place], [place], [place]

## 2019-04-22 NOTE — Consult Note (Signed)
PULMONARY / CRITICAL CARE MEDICINE  Name: Colton Lamb MRN: [id number] DOB: 11-26-1950    LOS: 1  Referring Provider: Dr. Bridgett Larsson Reason for Referral: Sepsis and septic shock Brief patient description: 68 year old male presenting with sepsis secondary to pneumonia and infected decubitus ulcer and acute blood loss anemia  HPI: This is a 68 year old male with a medical history as indicated below, currently on Xarelto for history of flutter, complex medical history, currently a SNF resident, who presented to the ED for evaluation of bleeding from sacral ulcer.  Patient was recently hospitalized with severe pneumonia and discharged to SNF.  Since admission to SNF he reports that pressure ulcer he developed during hospitalization has gotten worse and over the last few days, bleeding had started.  He denies any pain.  His ED work-up showed a potassium of 5.5, lactic acid of 4.7, WBC of 18.5K and a hemoglobin of 7.0.  His PT INR was 36.5/3.8.  His blood pressures remained low with systolic blood pressures less than 80 despite fluid resuscitation.  He was started on pressors and admitted to the ICU for further management. Patient was seen by general surgery at bedside and found to have a large stage IV bleeding sacral ulcer that is complicated by hypercoagulable state with an INR that is greater than 3.  Also was debrided with abundant amount of clots and necrotic tissue extracted.  Sacral bone is visible. Patient was transfused 2 units of packed red blood cells but his hemoglobin continues to drop despite transfusion.  Past Medical History:  Diagnosis Date  . Atrial flutter ([place])    a. s/p Cardioversion 11/22/13, on amiodarone and Xarelto.  . Chronic systolic heart failure ([place])    a. 10/2013 EF 20-25%, grade III DD, RV mildly dilated and sys fx mild/mod reduced;  b. 01/2014 Echo: EF 30-35%, gr3 DD, mod dil LA.  Marland Kitchen Coronary artery disease    a. s/p MI 2007/2015;  b. s/p prior PCI to the LAD/LCX/PDA/PL;   c. 2008: s/p Cypher DES to the OM.  Marland Kitchen Crohn's ileocolitis ([place])   . GERD (gastroesophageal reflux disease)   . Hx of adenomatous colonic polyps 11/2003  . Hyperlipidemia   . Hypertension   . Ischemic cardiomyopathy    s. 01/2014 s/p MDT [id number] [name] XT DR single lead AICD.  Marland Kitchen Obesity   . Paroxysmal atrial fibrillation ([facility])    a. CHA2DS2VASc = 4-->xarelto/amio.  . Sleep apnea   . Syncope    a.  11/2013 in setting of volume depletion and bradycardia due to dig toxicity   . Type II diabetes mellitus ([place])    Past Surgical History:  Procedure Laterality Date  . ATRIAL FLUTTER ABLATION N/A 04/16/2014   Procedure: ATRIAL FLUTTER ABLATION;  Surgeon: Evans Lance, MD;  Location: [name] CATH LAB;  Service: Cardiovascular;  Laterality: N/A;  . CARDIAC CATHETERIZATION  10/2013  . CARDIAC DEFIBRILLATOR PLACEMENT  04/16/2014   Medtronic Evira device  . CARDIAC ELECTROPHYSIOLOGY STUDY AND ABLATION  04/16/2014   atrial flutter ablation  . CARDIOVERSION N/A 03/05/2014   Procedure: CARDIOVERSION;  Surgeon: Jolaine Artist, MD;  Location: [facility] ENDOSCOPY;  Service: Cardiovascular;  Laterality: N/A;  . CATARACT EXTRACTION W/PHACO Right 01/04/2017   Procedure: CATARACT EXTRACTION PHACO AND INTRAOCULAR LENS PLACEMENT ([place])  Right Diabetic Complicated;  Surgeon: Leandrew Koyanagi, MD;  Location: [place];  Service: Ophthalmology;  Laterality: Right;  Diabetic  . CATARACT EXTRACTION W/PHACO Left 02/08/2017   Procedure: CATARACT EXTRACTION PHACO AND INTRAOCULAR LENS PLACEMENT (  IOC) left diabetic;  Surgeon: Leandrew Koyanagi, MD;  Location: [place];  Service: Ophthalmology;  Laterality: Left;  Diabetic - oral meds sleep apnea  . CORONARY ANGIOPLASTY WITH STENT PLACEMENT  2007; 2008 X 2   "1+1 ~ 1"  . FOOT SURGERY Left    bone spur  . HYDROCELE EXCISION Bilateral   . 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 IMPLANTABLE CARDIOVERTER DEFIBRILLATOR IMPLANT N/A  04/16/2014   Procedure: IMPLANTABLE CARDIOVERTER DEFIBRILLATOR IMPLANT;  Surgeon: Evans Lance, MD;  Location: [facility] CATH LAB;  Service: Cardiovascular;  Laterality: N/A;  . LEFT HEART CATHETERIZATION WITH CORONARY ANGIOGRAM N/A 11/22/2013   Procedure: LEFT HEART CATHETERIZATION WITH CORONARY ANGIOGRAM;  Surgeon: Sinclair Grooms, MD;  Location: [facility] C[facility] LAB;  Service: Cardiovascular;  Laterality: N/A;   No current facility-administered medications on file prior to encounter.    Current Outpatient Medications on File Prior to Encounter  Medication Sig  . acetaminophen (TYLENOL) 325 MG tablet Take 1-2 tablets (325-650 mg total) by mouth every 4 (four) hours as needed for mild pain.  . Albuterol Sulfate 108 (90 Base) MCG/ACT AEPB Inhale 1 puff into the lungs every 6 (six) hours as needed (shortness of breath).   Marland Kitchen amiodarone (PACERONE) 200 MG tablet Take 1 tablet (200 mg total) by mouth daily.  Marland Kitchen aspirin 81 MG tablet Take 81 mg by mouth daily.   Marland Kitchen atorvastatin (LIPITOR) 80 MG tablet TAKE 1 TABLET BY MOUTH EVERY DAY (Patient taking differently: Take 80 mg by mouth daily. )  . balsalazide (COLAZAL) 750 MG capsule TAKE 1 CAPSULE (750 MG TOTAL) BY MOUTH 3 (THREE) TIMES DAILY. (Patient taking differently: Take 750 mg by mouth 3 (three) times daily. )  . finasteride (PROSCAR) 5 MG tablet Take 1 tablet (5 mg total) by mouth daily.  . insulin aspart (NOVOLOG) 100 UNIT/ML injection Inject 5 Units into the skin 3 (three) times daily with meals. (Patient taking differently: Inject 6 Units into the skin 3 (three) times daily before meals. )  . insulin detemir (LEVEMIR) 100 UNIT/ML injection Inject 0.13 mLs (13 Units total) into the skin daily. (Patient taking differently: Inject 20 Units into the skin daily. )  . lactobacillus (FLORANEX/LACTINEX) PACK Take 1 packet (1 g total) by mouth 3 (three) times daily with meals.  . metoprolol succinate (TOPROL-XL) 25 MG 24 hr tablet Take 1 tablet (25 mg total) by mouth  daily. (Patient taking differently: Take 37.5 mg by mouth daily. )  . Multiple Vitamins-Minerals (MULTIVITAMIN WITH MINERALS) tablet Take 1 tablet by mouth daily.  . nitroGLYCERIN (NITROSTAT) 0.4 MG SL tablet Place 0.4 mg under the tongue every 5 (five) minutes as needed for chest pain (MAX 3 TABLETS).   . Nutritional Supplements (FEEDING SUPPLEMENT, NEPRO CARB STEADY,) LIQD Take 237 mLs by mouth 2 (two) times daily between meals.  Marland Kitchen omeprazole (PRILOSEC) 20 MG capsule Take 20 mg by mouth daily.   . potassium chloride SA (K-DUR) 20 MEQ tablet Take 1 tablet (20 mEq total) by mouth daily.  . sodium hypochlorite (DAKIN'S 1/2 STRENGTH) external solution Irrigate with 1 application as directed 2 (two) times daily. (apply to sacral wound)  . tamsulosin (FLOMAX) 0.4 MG CAPS capsule Take 1 capsule (0.4 mg total) by mouth daily after breakfast.  . vitamin C (ASCORBIC ACID) 500 MG tablet Take 500 mg by mouth 2 (two) times daily.  Alveda Reasons 20 MG TABS tablet TAKE 1 TABLET BY MOUTH EVERY DAY WITH LUNCH (Patient  taking differently: Take 20 mg by mouth daily. )  . niacin (NIASPAN) 1000 MG CR tablet Take 1,000 mg by mouth daily.    Allergies Allergies  Allergen Reactions  . Iodine Other (See Comments)    Shortness of breath, swelling and hives  . Shrimp [Shellfish Allergy] Other (See Comments)    SWELLING    HIVES    SHORTNESS OF BREATH  . Tetracycline Rash    Family History Family History  Problem Relation Age of Onset  . Breast cancer Mother   . Heart disease Father   . Heart attack Father   . Colon cancer Neg Hx    Social History  reports that he has never smoked. He has never used smokeless tobacco. He reports that he does not drink alcohol or use drugs.  Review Of Systems:   Constitutional: Negative for fever and chills.  HENT: Negative for congestion and rhinorrhea.  Eyes: Negative for redness and visual disturbance.  Respiratory: Positive for shortness of breath but negative for  wheezing.  Cardiovascular: Negative for chest pain and palpitations.  Gastrointestinal: Negative  for nausea , vomiting and abdominal pain and  Loose stools Genitourinary: Negative for dysuria and urgency.  Endocrine: Denies polyuria, polyphagia and heat intolerance Musculoskeletal: Negative for myalgias and arthralgias.  Skin: Reports bleeding from sacral ulcer Neurological: Negative for dizziness and headaches   VITAL SIGNS: BP 106/85 (BP Location: Left Arm)   Pulse 96   Temp 97.6 F (36.4 C) (Axillary)   Resp 14   Ht 5' 11"  (1.803 m)   Wt 120.4 kg   SpO2 91%   BMI 37.02 kg/m   HEMODYNAMICS:    VENTILATOR SETTINGS:    INTAKE / OUTPUT: I/O last 3 completed shifts: In: 860 [Blood:360; IV Piggyback:500] Out: -   PHYSICAL EXAMINATION: General: Obese, no acute distress HEENT: PERRLA, trachea midline, no JVD Neuro: Alert and oriented x3, no focal deficit Cardiovascular: Apical pulse irregular, S1-S2, no murmur regurg or gallop, +2 pulses bilateral, no edema Lungs: Bilateral breath sounds diminished in the bases, no wheezes or rhonchi Abdomen: Obese, normal bowel sounds in all 4 quadrants Musculoskeletal: Warm and dry with no joint deformities Skin: Skin is warm and dry, large sacral ulcer with extensive tunneling in necrotic tissue in the wound bed as well as multiple blood clots in different locations with profuse bleeding from multiple sites  LABS:  BMET Recent Labs  Lab 04/21/19 1109 04/21/19 2217  NA 137 138  K 5.5* 5.5*  CL 105 108  CO2 21* 22  BUN 25* 26*  CREATININE 1.07 1.06  GLUCOSE 140* 132*    Electrolytes Recent Labs  Lab 04/21/19 1109 04/21/19 2217  CALCIUM 8.3* 7.7*  MG  --  1.9  PHOS  --  4.8*    CBC Recent Labs  Lab 04/21/19 1109 04/21/19 2217  WBC 18.5* 13.6*  HGB 7.0* 6.7*  HCT 23.8* 21.7*  PLT 476* 357    Coag's Recent Labs  Lab 04/21/19 1109 04/21/19 2217  APTT 49* 47*  INR 3.8* 3.0*    Sepsis Markers Recent  Labs  Lab 04/21/19 1413 04/21/19 1645 04/21/19 2217  LATICACIDVEN 3.3* 2.5* 1.4    ABG No results for input(s): PHART, PCO2ART, PO2ART in the last 168 hours.  Liver Enzymes Recent Labs  Lab 04/21/19 1109 04/21/19 2217  AST 28 33  ALT 13 14  ALKPHOS 211* 174*  BILITOT 0.6 0.8  ALBUMIN 1.8* 1.7*    Cardiac Enzymes No results for input(s):  TROPONINI, PROBNP in the last 168 hours.  Glucose Recent Labs  Lab 04/21/19 1856 04/21/19 2213  GLUCAP 106* 120*    Imaging Dg Chest Port 1 View  Result Date: 04/21/2019 CLINICAL DATA:  Shortness of breath EXAM: PORTABLE CHEST 1 VIEW COMPARISON:  03/18/2019 FINDINGS: Right lower lobe opacity, likely reflecting a combination of atelectasis and moderate right pleural effusion, mildly progressive. Left lung is clear. No frank interstitial edema. No pneumothorax. Cardiomegaly.  Left subclavian ICD. IMPRESSION: Right lower lobe opacity, likely reflecting combination of atelectasis and moderate right pleural effusion, mildly progressive. Electronically Signed   By: Julian Hy M.D.   On: 04/21/2019 11:40    STUDIES:  None  CULTURES: Blood cultures x2  ANTIBIOTICS: Vancomycin Cefepime Flagyl  SIGNIFICANT EVENTS: 04/21/2019: Admitted  LINES/TUBES: Peripheral IV  DISCUSSION: 68 year old male presenting with sepsis, septic shock secondary to infected decubitus ulcer and pneumonia, acute blood loss anemia secondary to bleeding decubitus ulcer   ASSESSMENT  Septic shock Sepsis secondary to pneumonia and infected decubitus ulcer Acute blood loss anemia secondary to bleeding decubitus ulcer and hypercoagulable state from [place] acquired pneumonia-this is recurrent for patient Chronic atrial fibrillation Acute renal failure Hyperkalemia History of diastolic heart failure with an EF of 35% Type 2 diabetes mellitus  PLAN Hemodynamic monitoring per ICU protocol Blood pressures improved with IV fluids, currently  not requiring pressors Transfuse packed red blood cells until hemoglobin is greater than 7 Antibiotics as above Follow-up cultures Trend procalcitonin and adjust antibiotics Continue to hold Xarelto in light of bleeding Gentle IV hydration Monitor and correct electrolytes Surgery following Palliative care consult for goals of care Blood glucose monitoring with sliding scale insulin coverage as well as basal insulin coverage Best Practice: Code Status: Full code Diet: Carb modified diet GI prophylaxis: Not indicated VTE prophylaxis: SCDs and no pharmacologic VTE prophylaxis due to bleeding  FAMILY  - Updates: Patient and family updated by admitting service and ED.  Will update with any new changes in treatment plan   S. Tukov-Yual ANP-BC Pulmonary and [place] Pager [phone] or [phone]  NB: This document was prepared using Dragon voice recognition software and may include unintentional dictation errors.    04/22/2019, 4:58 AM

## 2019-04-22 NOTE — Progress Notes (Signed)
Patient's sister-in-law pointed out a small, dime-size area of discoloration to the underside below the right foot great toe.  Patient's sister-in-law is also concerned about some slightly darker skin on the ball of the left foot.  She states he has had these discolorations for approximately one month.  Dr. Lanney Gins assessed and asked for me to reach out to the wound nurse.  Secure chat message sent to Maryfrances Bunnell, RN, wound care nurse.

## 2019-04-22 NOTE — Progress Notes (Signed)
Initial Nutrition Assessment  DOCUMENTATION CODES:   Obesity unspecified  INTERVENTION:  Increase to Nepro Shake po TID, each supplement provides 425 kcal and 19 grams protein.  Will change MVI to Ocuvite daily for wound healing (provides zinc, vitamin A, vitamin C, Vitamin E, copper, and selenium).  Continue vitamin C 500 mg BID.  Discussed increased nutrient needs to promote wound healing.  NUTRITION DIAGNOSIS:   Increased nutrient needs related to wound healing as evidenced by estimated needs.  GOAL:   Patient will meet greater than or equal to 90% of their needs  MONITOR:   PO intake, Supplement acceptance, Labs, Weight trends, I & O's  REASON FOR ASSESSMENT:   Consult Assessment of nutrition requirement/status  ASSESSMENT:   68 year old male with PMHx of Crohn's disease, GERD, CAD, HTN, paroxysmal A-fib, ischemic cardiomyopathy, HLD, sleep apnea, DM type 2 admitted from SNF with septic shock, PNA, infected decubitus ulcer with significant amount of bleeding s/p bedside debridement, acute blood loss anemia.   Met with patient at bedside. He reports he has a good appetite that he feels is unchanged from baseline. He reports he is still eating the same amount of food he always has. He eats a larger breakfast and then eats smaller meals at lunch and dinner. He reports he was started on Nepro at his SNF and he drinks them BID. He is willing to continue drinking Nepro here but reports he hasn't had any yet today.   Patient reports he is weight-stable at around 270 lbs. Per chart  He has lost about 2 kg (1.6% body weight) over the past month, which is not significant for time frame.   Medications reviewed and include: amiodarone, Novolog 0-9 units TID, Novolog 0-5 units QHS, Novolog 6 units TID, Levemir 20 units daily, lactobacillus 1 gram TID, MVI daily, niacin 1000 mg daily, pantoprazole, Flomax, vitamin C 500 mg BID, NS at 50 mL/hr, cefepime, Flagyl, vancomycin.  Labs  reviewed: CBG 109-118, CO2 19, BUN 26.  Patient does not meet criteria for malnutrition at this time per AND/ASPEN guidelines. When a patient presents with low albumin, it is likely skewed due to the acute inflammatory response. Note that low albumin is no longer used to diagnose malnutrition; [place] uses the malnutrition guidelines published by the American Society for Parenteral and Enteral Nutrition (A.S.P.E.N.) and the Academy of Nutrition and Dietetics (AND).    NUTRITION - FOCUSED PHYSICAL EXAM:    Most Recent Value  Orbital Region  No depletion  Upper Arm Region  No depletion  Thoracic and Lumbar Region  No depletion  Buccal Region  No depletion  Temple Region  No depletion  Clavicle Bone Region  No depletion  Clavicle and Acromion Bone Region  No depletion  Scapular Bone Region  No depletion  Dorsal Hand  No depletion  Patellar Region  Unable to assess  Anterior Thigh Region  Unable to assess  Posterior Calf Region  Unable to assess  Edema (RD Assessment)  Moderate  Hair  Reviewed  Eyes  Reviewed  Mouth  Reviewed  Skin  Reviewed  Nails  Reviewed     Diet Order:   Diet Order            Diet heart healthy/carb modified Room service appropriate? Yes; Fluid consistency: Thin  Diet effective now             EDUCATION NEEDS:   Education needs have been addressed  Skin:  Skin Assessment: Skin Integrity Issues:(stg IV  sacrum)  Last BM:  04/21/2019 per chart  Height:   Ht Readings from Last 1 Encounters:  04/21/19 _0  (1.803 m)   Weight:   Wt Readings from Last 1 Encounters:  04/21/19 120.4 kg   Ideal Body Weight:  78.2 kg  BMI:  Body mass index is 37.02 kg/m.  Estimated Nutritional Needs:   Kcal:  1597-3312  Protein:  115-125 grams  Fluid:  per MD  Willey Blade, MS, RD, LDN Office: [phone] Pager: [phone] After Hours/Weekend Pager: [phone]

## 2019-04-22 NOTE — Consult Note (Signed)
Pharmacy Antibiotic Note  Colton Lamb is a 68 y.o. male admitted on 04/21/2019 with sepsis. Patient is a 68 y/o M BIBEMS from nursing home with shortness of breath and stage 4 sacral wound c/b bleeding. Necrotic tissue of ulcer bed down to bone was debrided. WBC has decreased and patient is afebrile. Pharmacy has been consulted for vancomycin & cefepime dosing.  Plan: Continue cefepime 2g Q8H.  Change dose of vancomycin 1000 mg IV Q12H.  Goal AUC 400-550. Expected AUC: 538.1 Cmin: 16.1 SCr used: 1.09  Continue to follow culture results and clinical signs of improvement with antibiotic therapy. Plan to obtain levels in 4-5 days if patient remains on therapy. Follow daily serum creatinine, per protocol.    Height: 5' 11"  (180.3 cm) Weight: 265 lb 6.9 oz (120.4 kg) IBW/kg (Calculated) : 75.3  Temp (24hrs), Avg:97.9 F (36.6 C), Min:97.3 F (36.3 C), Max:99.3 F (37.4 C)  Recent Labs  Lab 04/21/19 1109 04/21/19 1413 04/21/19 1645 04/21/19 2217 04/22/19 0605  WBC 18.5*  --   --  13.6* 10.4  CREATININE 1.07  --   --  1.06 1.09  LATICACIDVEN 4.7* 3.3* 2.5* 1.4  --     Estimated Creatinine Clearance: 85.6 mL/min (by C-G formula based on SCr of 1.09 mg/dL).    Allergies  Allergen Reactions  . Iodine Other (See Comments)    Shortness of breath, swelling and hives  . Shrimp [Shellfish Allergy] Other (See Comments)    SWELLING    HIVES    SHORTNESS OF BREATH  . Tetracycline Rash    Antimicrobials this admission: Vancomycin 9/20 >>  Cefepime 9/20 >>  Metronidazole 9/20 >>  Dose adjustments this admission: 9/21 vancomycin dose changed from 1264m Q18H to 10058mQ12H.  Microbiology results: 9/20 BCx: pending; no growth <24hrs 9/20 MRSA PCR: negative  Thank you for allowing pharmacy to be a part of this patient's care.  Krista Som Dear DuNicholes [employer] student 04/22/2019 1:22 PM

## 2019-04-23 LAB — CBC WITH DIFFERENTIAL/PLATELET
Abs Immature Granulocytes: 0.09 10*3/uL — ABNORMAL HIGH (ref 0.00–0.07)
Basophils Absolute: 0.1 10*3/uL (ref 0.0–0.1)
Basophils Relative: 1 %
Eosinophils Absolute: 0.4 10*3/uL (ref 0.0–0.5)
Eosinophils Relative: 3 %
HCT: 26.7 % — ABNORMAL LOW (ref 39.0–52.0)
Hemoglobin: 8.3 g/dL — ABNORMAL LOW (ref 13.0–17.0)
Immature Granulocytes: 1 %
Lymphocytes Relative: 10 %
Lymphs Abs: 1.3 10*3/uL (ref 0.7–4.0)
MCH: 26.9 pg (ref 26.0–34.0)
MCHC: 31.1 g/dL (ref 30.0–36.0)
MCV: 86.4 fL (ref 80.0–100.0)
Monocytes Absolute: 0.7 10*3/uL (ref 0.1–1.0)
Monocytes Relative: 5 %
Neutro Abs: 10.3 10*3/uL — ABNORMAL HIGH (ref 1.7–7.7)
Neutrophils Relative %: 80 %
Platelets: 283 10*3/uL (ref 150–400)
RBC: 3.09 MIL/uL — ABNORMAL LOW (ref 4.22–5.81)
RDW: 18.4 % — ABNORMAL HIGH (ref 11.5–15.5)
WBC: 12.8 10*3/uL — ABNORMAL HIGH (ref 4.0–10.5)
nRBC: 0.2 % (ref 0.0–0.2)

## 2019-04-23 LAB — BASIC METABOLIC PANEL
Anion gap: 7 (ref 5–15)
BUN: 25 mg/dL — ABNORMAL HIGH (ref 8–23)
CO2: 22 mmol/L (ref 22–32)
Calcium: 7.8 mg/dL — ABNORMAL LOW (ref 8.9–10.3)
Chloride: 108 mmol/L (ref 98–111)
Creatinine, Ser: 1.09 mg/dL (ref 0.61–1.24)
GFR calc Af Amer: >60 mL/min (ref >60)
GFR calc non Af Amer: >60 mL/min (ref >60)
Glucose, Bld: 130 mg/dL — ABNORMAL HIGH (ref 70–99)
Potassium: 4.7 mmol/L (ref 3.5–5.1)
Sodium: 137 mmol/L (ref 135–145)

## 2019-04-23 LAB — HIV ANTIBODY (ROUTINE TESTING W REFLEX): HIV Screen 4th Generation wRfx: NONREACTIVE

## 2019-04-23 LAB — TYPE AND SCREEN
ABO/RH(D): B POS
Antibody Screen: NEGATIVE
Unit division: 0
Unit division: 0
Unit division: 0

## 2019-04-23 LAB — BPAM RBC
Blood Product Expiration Date: 202010242359
Blood Product Expiration Date: 202010242359
Blood Product Expiration Date: 202010242359
ISSUE DATE / TIME: 202009201754
ISSUE DATE / TIME: 202009210040
ISSUE DATE / TIME: 202009210850
Unit Type and Rh: 7300
Unit Type and Rh: 7300
Unit Type and Rh: 7300

## 2019-04-23 LAB — HEMOGLOBIN AND HEMATOCRIT, BLOOD
HCT: 26.8 % — ABNORMAL LOW (ref 39.0–52.0)
HCT: 28.2 % — ABNORMAL LOW (ref 39.0–52.0)
Hemoglobin: 8.3 g/dL — ABNORMAL LOW (ref 13.0–17.0)
Hemoglobin: 8.7 g/dL — ABNORMAL LOW (ref 13.0–17.0)

## 2019-04-23 LAB — PROCALCITONIN: Procalcitonin: 0.42 ng/mL

## 2019-04-23 LAB — MAGNESIUM: Magnesium: 2 mg/dL (ref 1.7–2.4)

## 2019-04-23 LAB — GLUCOSE, CAPILLARY
Glucose-Capillary: 102 mg/dL — ABNORMAL HIGH (ref 70–99)
Glucose-Capillary: 105 mg/dL — ABNORMAL HIGH (ref 70–99)
Glucose-Capillary: 97 mg/dL (ref 70–99)
Glucose-Capillary: 126 mg/dL — ABNORMAL HIGH (ref 70–99)

## 2019-04-23 MED ORDER — MUPIROCIN CALCIUM 2 % EX CREA
TOPICAL_CREAM | Freq: Every day | CUTANEOUS | Status: DC
  Administered 2019-04-23 – 2019-04-26 (×4): via TOPICAL
  Filled 2019-04-23: qty 15

## 2019-04-23 MED ORDER — PNEUMOCOCCAL VAC POLYVALENT 25 MCG/0.5ML IJ INJ
0.5000 mL | INJECTION | INTRAMUSCULAR | Status: AC
  Administered 2019-04-26: 11:00:00 0.5 mL via INTRAMUSCULAR
  Filled 2019-04-23: qty 0.5

## 2019-04-23 MED ORDER — INSULIN DETEMIR 100 UNIT/ML ~~LOC~~ SOLN
10.0000 [IU] | Freq: Every day | SUBCUTANEOUS | Status: DC
  Administered 2019-04-23 – 2019-04-26 (×4): 10 [IU] via SUBCUTANEOUS
  Filled 2019-04-23 (×5): qty 0.1

## 2019-04-23 MED ORDER — INFLUENZA VAC A&B SA ADJ QUAD 0.5 ML IM PRSY
0.5000 mL | PREFILLED_SYRINGE | INTRAMUSCULAR | Status: AC
  Administered 2019-04-26: 0.5 mL via INTRAMUSCULAR
  Filled 2019-04-23 (×2): qty 0.5

## 2019-04-23 NOTE — Progress Notes (Signed)
CRITICAL CARE PROGRESS NOTE    Name: Colton Lamb MRN: [id number] DOB: 1951/04/07     LOS: 2   SUBJECTIVE FINDINGS & SIGNIFICANT EVENTS   Patient description:   This is a 68 year old male with a medical history as indicated below, currently on Xarelto for history of flutter, complex medical history, currently a SNF resident, who presented to the ED for evaluation of bleeding from sacral ulcer.  Patient was recently hospitalized with severe pneumonia and discharged to SNF.  Since admission to SNF he reports that pressure ulcer he developed during hospitalization has gotten worse and over the last few days, bleeding had started.  He denies any pain.  His ED work-up showed a potassium of 5.5, lactic acid of 4.7, WBC of 18.5K and a hemoglobin of 7.0.  His PT INR was 36.5/3.8.  His blood pressures remained low with systolic blood pressures less than 80 despite fluid resuscitation.  He was started on pressors and admitted to the ICU for further management. Patient was seen by general surgery at bedside and found to have a large stage IV bleeding sacral ulcer that is complicated by hypercoagulable state with an INR that is greater than 3.  Also was debrided with abundant amount of clots and necrotic tissue extracted.  Sacral bone is visible. Patient was transfused 2 units of packed red blood cells but his hemoglobin continues to drop despite transfusion.   Lines / Drains: PIV  Cultures / Sepsis markers: Wound cx  Antibiotics: Vanc/cef/flag   Protocols / Consultants: Surgery  Wound care   Overnight: Patient is without appetite this am.  Will montior blood gluc   PAST MEDICAL HISTORY   Past Medical History:  Diagnosis Date   Atrial flutter ([place])    a. s/p Cardioversion 11/22/13, on amiodarone and Xarelto.     Chronic systolic heart failure ([place])    a. 10/2013 EF 20-25%, grade III DD, RV mildly dilated and sys fx mild/mod reduced;  b. 01/2014 Echo: EF 30-35%, gr3 DD, mod dil LA.   Coronary artery disease    a. s/p MI 2007/2015;  b. s/p prior PCI to the LAD/LCX/PDA/PL;  c. 2008: s/p Cypher DES to the OM.   Crohn's ileocolitis ([place])    GERD (gastroesophageal reflux disease)    Hx of adenomatous colonic polyps 11/2003   Hyperlipidemia    Hypertension    Ischemic cardiomyopathy    s. 01/2014 s/p MDT [id number] [name] XT DR single lead AICD.   Obesity    Paroxysmal atrial fibrillation (HCC)    a. CHA2DS2VASc = 4-->xarelto/amio.   Sleep apnea    Syncope    a.  11/2013 in setting of volume depletion and bradycardia due to dig toxicity    Type II diabetes mellitus ([place])      SURGICAL HISTORY   Past Surgical History:  Procedure Laterality Date   ATRIAL FLUTTER ABLATION N/A 04/16/2014   Procedure: ATRIAL FLUTTER ABLATION;  Surgeon: Evans Lance, MD;  Location: [facility] CATH LAB;  Service: Cardiovascular;  Laterality: N/A;   CARDIAC CATHETERIZATION  10/2013   CARDIAC DEFIBRILLATOR PLACEMENT  04/16/2014   Medtronic Evira device   CARDIAC ELECTROPHYSIOLOGY STUDY AND ABLATION  04/16/2014   atrial flutter ablation   CARDIOVERSION N/A 03/05/2014   Procedure: CARDIOVERSION;  Surgeon: Jolaine Artist, MD;  Location: [facility] ENDOSCOPY;  Service: Cardiovascular;  Laterality: N/A;   CATARACT EXTRACTION W/PHACO Right 01/04/2017   Procedure: CATARACT EXTRACTION PHACO AND INTRAOCULAR LENS PLACEMENT (IOC)  Right Diabetic Complicated;  Surgeon: Leandrew Koyanagi, MD;  Location: [facility];  Service: Ophthalmology;  Laterality: Right;  Diabetic   CATARACT EXTRACTION W/PHACO Left 02/08/2017   Procedure: CATARACT EXTRACTION PHACO AND INTRAOCULAR LENS PLACEMENT (IOC) left diabetic;  Surgeon: Leandrew Koyanagi, MD;  Location: [facility];  Service: Ophthalmology;  Laterality: Left;  Diabetic -  oral meds sleep apnea   CORONARY ANGIOPLASTY WITH STENT PLACEMENT  2007; 2008 X 2   "1+1 ~ 1"   FOOT SURGERY Left    bone spur   HYDROCELE EXCISION Bilateral    Ileocecal resection and sigmoid enterocolonic fistula repair  09/1998   IMPLANTABLE CARDIOVERTER DEFIBRILLATOR IMPLANT N/A 04/16/2014   Procedure: IMPLANTABLE CARDIOVERTER DEFIBRILLATOR IMPLANT;  Surgeon: Evans Lance, MD;  Location: [facility] CATH LAB;  Service: Cardiovascular;  Laterality: N/A;   LEFT HEART CATHETERIZATION WITH CORONARY ANGIOGRAM N/A 11/22/2013   Procedure: LEFT HEART CATHETERIZATION WITH CORONARY ANGIOGRAM;  Surgeon: Sinclair Grooms, MD;  Location: [name] CATH LAB;  Service: Cardiovascular;  Laterality: N/A;     FAMILY HISTORY   Family History  Problem Relation Age of Onset   Breast cancer Mother    Heart disease Father    Heart attack Father    Colon cancer Neg Hx      SOCIAL HISTORY   Social History   Tobacco Use   Smoking status: Never Smoker   Smokeless tobacco: Never Used  Substance Use Topics   Alcohol use: No   Drug use: No     MEDICATIONS   Current Medication:  Current Facility-Administered Medications:    0.9 %  sodium chloride infusion, , Intravenous, Continuous, Demetrios Loll, MD, Last Rate: 50 mL/hr at 04/23/19 0600   acetaminophen (TYLENOL) tablet 650 mg, 650 mg, Oral, Q6H PRN **OR** acetaminophen (TYLENOL) suppository 650 mg, 650 mg, Rectal, Q6H PRN, Demetrios Loll, MD   albuterol (PROVENTIL) (2.5 MG/3ML) 0.083% nebulizer solution 2.5 mg, 2.5 mg, Nebulization, Q2H PRN, Demetrios Loll, MD   amiodarone (PACERONE) tablet 200 mg, 200 mg, Oral, Daily, Bridgett Larsson, Qing, MD, 200 mg at 04/23/19 0932   aspirin chewable tablet 81 mg, 81 mg, Oral, Daily, Demetrios Loll, MD, 81 mg at 04/23/19 0932   atorvastatin (LIPITOR) tablet 80 mg, 80 mg, Oral, Daily, Ojie, Jude, MD, 80 mg at 04/23/19 0932   balsalazide (COLAZAL) capsule 750 mg, 750 mg, Oral, TID, Demetrios Loll, MD   bisacodyl (DULCOLAX) EC  tablet 5 mg, 5 mg, Oral, Daily PRN, Demetrios Loll, MD   ceFEPIme (MAXIPIME) 2 g in sodium chloride 0.9 % 100 mL IVPB, 2 g, Intravenous, Q8H, Benita Gutter, RPH, Stopped at 04/23/19 0018   Chlorhexidine Gluconate Cloth 2 % PADS 6 each, 6 each, Topical, Daily, Tyler Pita, MD, 6 each at 04/23/19 0934   feeding supplement (NEPRO CARB STEADY) liquid 237 mL, 237 mL, Oral, TID BM, Khayree Delellis, MD, Last Rate: 0 mL/hr at 04/22/19 1955, 237 mL at 04/23/19 0933   finasteride (PROSCAR) tablet 5 mg, 5 mg, Oral, Daily, Demetrios Loll, MD, 5 mg at 04/23/19 0932   HYDROcodone-acetaminophen (NORCO/VICODIN) 5-325 MG per tablet 1 tablet, 1 tablet, Oral, Q4H PRN, Demetrios Loll, MD, 1 tablet at 04/23/19 0726   insulin aspart (novoLOG) injection 0-5 Units, 0-5 Units, Subcutaneous, QHS, Demetrios Loll, MD   insulin aspart (novoLOG) injection 0-9 Units, 0-9 Units, Subcutaneous, TID WC, Demetrios Loll, MD   insulin aspart (novoLOG) injection 6 Units, 6 Units, Subcutaneous, TID Otho Perl, MD, 6 Units at 04/22/19 1136   insulin detemir (  LEVEMIR) injection 20 Units, 20 Units, Subcutaneous, Daily, Demetrios Loll, MD, 20 Units at 04/22/19 0951   lactobacillus (FLORANEX/LACTINEX) granules 1 g, 1 g, Oral, TID WC, Demetrios Loll, MD, 1 g at 04/23/19 0805   metoprolol succinate (TOPROL-XL) 24 hr tablet 37.5 mg, 37.5 mg, Oral, Daily, Demetrios Loll, MD, 37.5 mg at 04/23/19 0932   metroNIDAZOLE (FLAGYL) IVPB 500 mg, 500 mg, Intravenous, Q8H, Tukov-Yual, Magdalene S, NP, Last Rate: 100 mL/hr at 04/23/19 0931, 500 mg at 04/23/19 0931   morphine 4 MG/ML injection 4 mg, 4 mg, Intravenous, Q4H PRN, Demetrios Loll, MD, 4 mg at 04/23/19 0831   multivitamin-lutein (OCUVITE-LUTEIN) capsule 1 capsule, 1 capsule, Oral, Daily, Ottie Glazier, MD, 1 capsule at 04/23/19 0932   mupirocin cream (BACTROBAN) 2 %, , Topical, Daily, [name], MD   niacin (NIASPAN) CR tablet 1,000 mg, 1,000 mg, Oral, Daily, Demetrios Loll, MD, 1,000 mg at 04/22/19  0949   nitroGLYCERIN (NITROSTAT) SL tablet 0.4 mg, 0.4 mg, Sublingual, Q5 min PRN, Demetrios Loll, MD   ondansetron [facility]) tablet 4 mg, 4 mg, Oral, Q6H PRN **OR** ondansetron (ZOFRAN) injection 4 mg, 4 mg, Intravenous, Q6H PRN, Demetrios Loll, MD   pantoprazole (PROTONIX) EC tablet 40 mg, 40 mg, Oral, Daily, Demetrios Loll, MD, 40 mg at 04/23/19 0932   senna-docusate (Senokot-S) tablet 1 tablet, 1 tablet, Oral, QHS PRN, Demetrios Loll, MD   tamsulosin [facility]) capsule 0.4 mg, 0.4 mg, Oral, QPC breakfast, Demetrios Loll, MD, 0.4 mg at 04/23/19 6144   vancomycin (VANCOCIN) IVPB 1000 mg/200 mL premix, 1,000 mg, Intravenous, Q12H, Charlett Nose, RPH, Last Rate: 200 mL/hr at 04/23/19 0805, 1,000 mg at 04/23/19 0805   vitamin C (ASCORBIC ACID) tablet 500 mg, 500 mg, Oral, BID, Demetrios Loll, MD, 500 mg at 04/22/19 2236    ALLERGIES   Iodine, Shrimp [shellfish allergy], and Tetracycline    REVIEW OF SYSTEMS    10 point ROS neg except as per subj findings.  PHYSICAL EXAMINATION   Vital Signs: Temp:  [97.1 F (36.2 C)-97.9 F (36.6 C)] 97.1 F (36.2 C) (09/22 0700) Pulse Rate:  [84-106] 99 (09/22 0700) Resp:  [13-25] 13 (09/22 0700) BP: (85-107)/(61-82) 101/82 (09/22 0700) SpO2:  [87 %-100 %] 99 % (09/22 0700)  GENERAL:NAD HEAD: Normocephalic, atraumatic.  EYES: Pupils equal, round, reactive to light.  No scleral icterus.  MOUTH: Moist mucosal membrane. NECK: Supple. No thyromegaly. No nodules. No JVD.  PULMONARY: ctab CARDIOVASCULAR: S1 and S2. Regular rate and rhythm. No murmurs, rubs, or gallops.  GASTROINTESTINAL: Soft, nontender, non-distended. No masses. Positive bowel sounds. No hepatosplenomegaly.  MUSCULOSKELETAL: No swelling, clubbing, or edema.  NEUROLOGIC: Mild distress due to acute illness SKIN:intact,warm,dry   PERTINENT DATA     Infusions:  sodium chloride 50 mL/hr at 04/23/19 0600   ceFEPime (MAXIPIME) IV Stopped (04/23/19 0018)   metronidazole 500 mg (04/23/19  0931)   vancomycin 1,000 mg (04/23/19 0805)   Scheduled Medications:  amiodarone  200 mg Oral Daily   aspirin  81 mg Oral Daily   atorvastatin  80 mg Oral Daily   balsalazide  750 mg Oral TID   Chlorhexidine Gluconate Cloth  6 each Topical Daily   feeding supplement (NEPRO CARB STEADY)  237 mL Oral TID BM   finasteride  5 mg Oral Daily   insulin aspart  0-5 Units Subcutaneous QHS   insulin aspart  0-9 Units Subcutaneous TID WC   insulin aspart  6 Units Subcutaneous TID AC   insulin detemir  20 Units Subcutaneous Daily   lactobacillus  1 g Oral TID WC   metoprolol succinate  37.5 mg Oral Daily   multivitamin-lutein  1 capsule Oral Daily   mupirocin cream   Topical Daily   niacin  1,000 mg Oral Daily   pantoprazole  40 mg Oral Daily   tamsulosin  0.4 mg Oral QPC breakfast   vitamin C  500 mg Oral BID   PRN Medications: acetaminophen **OR** acetaminophen, albuterol, bisacodyl, HYDROcodone-acetaminophen, morphine injection, nitroGLYCERIN, ondansetron **OR** ondansetron (ZOFRAN) IV, senna-docusate Hemodynamic parameters:   Intake/Output: 09/21 0701 - 09/22 0700 In: 3379.4 [P.O.:240; I.V.:1332.3; Blood:762; IV Piggyback:1045.1] Out: 1 [Stool:1]  Ventilator  Settings:      LAB RESULTS:  Basic Metabolic Panel: Recent Labs  Lab 04/21/19 1109 04/21/19 2217 04/22/19 0605 04/23/19 0139  NA 137 138 135 137  K 5.5* 5.5* 4.6 4.7  CL 105 108 106 108  CO2 21* 22 19* 22  GLUCOSE 140* 132* 116* 130*  BUN 25* 26* 26* 25*  CREATININE 1.07 1.06 1.09 1.09  CALCIUM 8.3* 7.7* 7.7* 7.8*  MG  --  1.9  --  2.0  PHOS  --  4.8*  --   --    Liver Function Tests: Recent Labs  Lab 04/21/19 1109 04/21/19 2217  AST 28 33  ALT 13 14  ALKPHOS 211* 174*  BILITOT 0.6 0.8  PROT 5.7* 5.1*  ALBUMIN 1.8* 1.7*   No results for input(s): LIPASE, AMYLASE in the last 168 hours. No results for input(s): AMMONIA in the last 168 hours. CBC: Recent Labs  Lab 04/21/19 1109  04/21/19 2217 04/22/19 0605 04/22/19 1938 04/23/19 0139 04/23/19 0947  WBC 18.5* 13.6* 10.4  --  12.8*  --   NEUTROABS  --  11.6*  --   --  10.3*  --   HGB 7.0* 6.7* 6.6* 8.1* 8.3* 8.3*  HCT 23.8* 21.7* 21.5* 26.2* 26.7* 26.8*  MCV 87.2 85.8 84.6  --  86.4  --   PLT 476* 357 272  --  283  --    Cardiac Enzymes: No results for input(s): CKTOTAL, CKMB, CKMBINDEX, TROPONINI in the last 168 hours. BNP: Invalid input(s): POCBNP CBG: Recent Labs  Lab 04/22/19 1114 04/22/19 1611 04/22/19 1701 04/22/19 2227 04/23/19 0747  GLUCAP 118* 70 81 128* 102*     IMAGING RESULTS:  Imaging: Dg Chest Port 1 View  Result Date: 04/21/2019 CLINICAL DATA:  Shortness of breath EXAM: PORTABLE CHEST 1 VIEW COMPARISON:  03/18/2019 FINDINGS: Right lower lobe opacity, likely reflecting a combination of atelectasis and moderate right pleural effusion, mildly progressive. Left lung is clear. No frank interstitial edema. No pneumothorax. Cardiomegaly.  Left subclavian ICD. IMPRESSION: Right lower lobe opacity, likely reflecting combination of atelectasis and moderate right pleural effusion, mildly progressive. Electronically Signed   By: Julian Hy M.D.   On: 04/21/2019 11:40      ASSESSMENT AND PLAN    -Multidisciplinary rounds held today  Acute blood loss anemia - due to supratherapeutic INR in context of novel anticoagulant use and severe bleeding sacral wound - s/p surgical debridement - appreciate collaboration - patient reports clinical improvement.  - s/p 3 units PRBc transfusion  -ICU monitoring  - h/h q6h   Large necrotic stageIV Sacral wound 9X10X4cm with 4 cm undermining to wound edges - s/p surgical debridement - appreciate collaboration  - wound care consultation -appreciate input - may require further sharp debridement -oxygen as needed -Lasix as tolerated -follow up cardiac enzymes as  indicated ICU monitoring    Atrial fibrillation   --SCD   ID -continue IV abx  as prescibed-cefepime/vanc -follow up cultures  GI/Nutrition GI PROPHYLAXIS as indicated DIET-->TF's as tolerated Constipation protocol as indicated  ENDO - ICU hypoglycemic\Hyperglycemia protocol -check FSBS per protocol   ELECTROLYTES -follow labs as needed -replace as needed -pharmacy consultation   DVT/GI PRX ordered -SCDs  TRANSFUSIONS AS NEEDED MONITOR FSBS ASSESS the need for LABS as needed   Critical care provider statement:    Critical care time (minutes):  32   Critical care time was exclusive of:  Separately billable procedures and treating other patients   Critical care was necessary to treat or prevent imminent or life-threatening deterioration of the following conditions:  acute blood loss anemia, large necrotic sacral wound, s/p debridement, AF, multiple comorbid conditions.    Critical care was time spent personally by me on the following activities:  Development of treatment plan with patient or surrogate, discussions with consultants, evaluation of patient's response to treatment, examination of patient, obtaining history from patient or surrogate, ordering and performing treatments and interventions, ordering and review of laboratory studies and re-evaluation of patient's condition.  I assumed direction of critical care for this patient from another provider in my specialty: no    This document was prepared using Dragon voice recognition software and may include unintentional dictation errors.    Ottie Glazier, M.D.  Division of [place]

## 2019-04-23 NOTE — Progress Notes (Signed)
[place] [facility] [place] NAME: Colton Lamb    MR#:  [id number]  DATE OF BIRTH:  08-20-1950  SUBJECTIVE:  Came in with bleeding of his chronic decubitus with foul smell More awake. drinking ensure  REVIEW OF SYSTEMS:   Review of Systems  Unable to perform ROS: Mental acuity   Tolerating Diet:yes Tolerating PT:   DRUG ALLERGIES:   Allergies  Allergen Reactions  . Iodine Other (See Comments)    Shortness of breath, swelling and hives  . Shrimp [Shellfish Allergy] Other (See Comments)    SWELLING    HIVES    SHORTNESS OF BREATH  . Tetracycline Rash    VITALS:  Blood pressure 96/84, pulse 99, temperature 97.8 F (36.6 C), temperature source Oral, resp. rate 16, height 5' 11"  (1.803 m), weight 120.4 kg, SpO2 100 %.  PHYSICAL EXAMINATION:   Physical Exam  GENERAL:  68 y.o.-year-old patient lying in the bed with no acute distress. crtically ill EYES: Pupils equal, round, reactive to light and accommodation. No scleral icterus. Extraocular muscles intact.  HEENT: Head atraumatic, normocephalic. Oropharynx and nasopharynx clear.  NECK:  Supple, no jugular venous distention. No thyroid enlargement, no tenderness.  LUNGS: Normal breath sounds bilaterally, no wheezing, rales, rhonchi. No use of accessory muscles of respiration.  CARDIOVASCULAR: S1, S2 normal. No murmurs, rubs, or gallops.  ABDOMEN: Soft, nontender, nondistended. Bowel sounds present. No organomegaly or mass.  EXTREMITIES: No cyanosis, clubbing or edema b/l.    NEUROLOGIC: grossly non focal PSYCHIATRIC:  patient issleepy  SKIN:      LABORATORY PANEL:  CBC Recent Labs  Lab 04/23/19 0139  04/23/19 1326  WBC 12.8*  --   --   HGB 8.3*   < > 8.7*  HCT 26.7*   < > 28.2*  PLT 283  --   --    < > = values in this interval not displayed.    Chemistries  Recent Labs  Lab 04/21/19 2217  04/23/19 0139  NA 138   < > 137  K 5.5*   < > 4.7  CL 108   < > 108   CO2 22   < > 22  GLUCOSE 132*   < > 130*  BUN 26*   < > 25*  CREATININE 1.06   < > 1.09  CALCIUM 7.7*   < > 7.8*  MG 1.9  --  2.0  AST 33  --   --   ALT 14  --   --   ALKPHOS 174*  --   --   BILITOT 0.8  --   --    < > = values in this interval not displayed.   Cardiac Enzymes No results for input(s): TROPONINI in the last 168 hours. RADIOLOGY:  No results found. ASSESSMENT AND PLAN:   68 year old male with a medical history chronic systolic congestive heart failure with EF of 20 to 25%, CAD, atrial fibrillation/flutter on currently on Xarelto for  complex medical history, currently a SNF resident, who presented to the ED for evaluation of bleeding from sacral ulcer.  Patient was recently hospitalized with severe pneumonia and discharged to SNF  1. Septic shock secondary to ?pneumonia and  infected decubitus ulcer with significant amount of bleeding causing acute blood loss anemia -continue IV  cefepime -surgical consultation was obtained by Dr. Windell Moment and bedside debridement was done. Patient has extensive wound up to the bone. -Aggressive wound dressing--followed by wound nurse -  follow blood culture and trend antibiotics accordingly  2. History of a flutter fib on Xarelto -holding Xarelto given significant bleeding from decubitus -continue metoprolol for rate control  3.Hyperkalemia--- Came in K of 5.5--4.6  4.Acute blood loss anemia due to significant bleeding frmom the decubitus -hgb 6.7---6.6--3 units BT -pt's INR is elevated due to xarelto  5.lactic acidosis due to infection -improving  Palliative care consultation placed   CODE STATUS: FULL  DVT Prophylaxis: SCD  TOTAL TIME TAKING CARE OF THIS PATIENT: *30* minutes.  >50% time spent on counselling and coordination of care  POSSIBLE D/C IN *?* DAYS, DEPENDING ON CLINICAL CONDITION.  Note: This dictation was prepared with Dragon dictation along with smaller phrase technology. Any transcriptional errors  that result from this process are unintentional.  Colton Lamb M.D on 04/23/2019 at 2:56 PM  Between 7am to 6pm - Pager - [phone]  After 6pm go to [email or web address] - password [email or web address] [place] [facility]  Office  [phone]  CC: Primary care physician; Toni Arthurs, NPPatient ID: Colton Lamb, male   DOB: Feb 12, 1951, 68 y.o.   MRN: [id number]

## 2019-04-23 NOTE — Consult Note (Signed)
Pharmacy Antibiotic Note  Colton Lamb is a 68 y.o. male admitted on 04/21/2019 with sepsis. Patient is a 68 y/o M BIBEMS from nursing home with shortness of breath and stage 4 sacral wound c/b bleeding. Necrotic tissue of ulcer debrided at bedside. Deep injury to the bone. Pharmacy has been consulted for vancomycin & cefepime dosing. Patient is also on metronidazole.   WBC trending up with slight left shift, PCT down-trending, and patient is afebrile. Patient has required 3 units PRBC this admission for blood loss anemia secondary to wound. No wound cultures drawn this admission.  Plan: Continue cefepime 2g Q8H.  Continue vancomycin 1000 mg IV Q12H.  Goal AUC 400-550. Expected AUC: 538.1 Cmin: 16.1 SCr used: 1.09  Continue to follow culture results and clinical signs of improvement with antibiotic therapy. Plan to obtain levels in 3-4 days if patient remains on therapy. Follow daily serum creatinine per protocol.   Height: 5' 11"  (180.3 cm) Weight: 265 lb 6.9 oz (120.4 kg) IBW/kg (Calculated) : 75.3  Temp (24hrs), Avg:97.6 F (36.4 C), Min:97.1 F (36.2 C), Max:97.9 F (36.6 C)  Recent Labs  Lab 04/21/19 1109 04/21/19 1413 04/21/19 1645 04/21/19 2217 04/22/19 0605 04/23/19 0139  WBC 18.5*  --   --  13.6* 10.4 12.8*  CREATININE 1.07  --   --  1.06 1.09 1.09  LATICACIDVEN 4.7* 3.3* 2.5* 1.4  --   --     Estimated Creatinine Clearance: 85.6 mL/min (by C-G formula based on SCr of 1.09 mg/dL).    Allergies  Allergen Reactions  . Iodine Other (See Comments)    Shortness of breath, swelling and hives  . Shrimp [Shellfish Allergy] Other (See Comments)    SWELLING    HIVES    SHORTNESS OF BREATH  . Tetracycline Rash    Antimicrobials this admission: Vancomycin 9/20 >>  Cefepime 9/20 >>  Metronidazole 9/20 >>  Dose adjustments this admission: 9/21 vancomycin dose changed from 1235m Q18H to 10033mQ12H.  Microbiology results: 9/20 BCx: pending; no growth <2  days 9/20 MRSA PCR: negative  Thank you for allowing pharmacy to be a part of this patient's care.  [place] 04/23/2019 2:15 PM

## 2019-04-23 NOTE — Progress Notes (Signed)
1451: Patient arrived to room 120 on 1C. Skin assessed and charted in documentation. Patient noted to have a stage two under foley cath tubing on left scrotal area, closer to penis. Cath tip moved away from open area. Small amount of barrier cream applied. Foley and peri care provided. Heels noted to have blanchable redness to both. Foams are in place on heels. Dark brown/black area noted on bottom of right foot slightly beneath third toe. Callus to side of left foot.

## 2019-04-23 NOTE — Progress Notes (Signed)
Ch f/u with pt that the writer encountered during a previous admission. Pt had a flat affect but was responsive. Pt presented to hv discomfort and shared with the ch that he has a bed sore that has worsened since his last admission. Ch provided a compassionate presence and offered words of encouragement. Pt's stepson is in [place]. Ch visit was appreciated.    04/23/19 1000  Clinical Encounter Type  Visited With Patient  Visit Type Follow-up  Spiritual Encounters  Spiritual Needs Emotional;Grief support  Stress Factors  Patient Stress Factors Health changes;Major life changes

## 2019-04-23 NOTE — Progress Notes (Signed)
PT Cancellation Note  Patient Details Name: Colton Lamb MRN: [id number] DOB: [birth year]   Cancelled Treatment:    Reason Eval/Treat Not Completed: Other (comment).  PT consult received.  Chart reviewed.  Nurse reports recently giving pt a bath and giving pt pain meds.  Upon PT entering room, pt resting in bed and reports no pain but was too tired to participate in therapy (pt declined PT session).  Will re-attempt PT evaluation at a later date/time.  Leitha Bleak, PT 04/23/19, 9:40 AM [phone]

## 2019-04-23 NOTE — Progress Notes (Signed)
Patient alert and oriented. Has had some pain to buttocks. Pain medication given and pain subsided. On room air, no complaints of shortness of breath. Has not had much of an appetite throughout the day.Foley intact 300 ml output for my shift. Hemoglobin stable at 8.3.

## 2019-04-23 NOTE — Care Plan (Signed)
PMT note:  Patient is resting in bed with wife at bedside. Shortly after my entry, patient's phone rang and he wanted to speak to the caller. Will speak with him tomorrow, but family meeting Thursday at 9:15-9:30.

## 2019-04-23 NOTE — Progress Notes (Signed)
[place] for Electrolyte Monitoring and Replacement   Recent Labs: Potassium (mmol/L)  Date Value  04/23/2019 4.7  11/22/2013 3.7   Magnesium (mg/dL)  Date Value  04/23/2019 2.0  11/22/2013 1.8   Calcium (mg/dL)  Date Value  04/23/2019 7.8 (L)   Calcium, Total (mg/dL)  Date Value  11/22/2013 9.0   Albumin (g/dL)  Date Value  04/21/2019 1.7 (L)  04/18/2018 4.3   Phosphorus (mg/dL)  Date Value  04/21/2019 4.8 (H)   Sodium (mmol/L)  Date Value  04/23/2019 137  05/09/2016 141  11/22/2013 133 (L)    Corrected Calcium: 9.54 mg/dL  Assessment: Benjimin Hadden is a 68 YO male admitted on 04/21/2019 with sepsis secondary to sacral wound. Patient has history of atrial flutter, chronic systolic heart failure, hyperlipidemia, hypertension, paroxysmal atrial fibrillation, and T2DM. Patient has acute blood loss anemia associated with a sacral wound requiring transfusion with 3 units PRBC.   Goal of Therapy:  Will keep K ~4, Mg ~2 in the setting of atrial fibrillation.   Plan:  No replacement warranted at this time.   BMP ordered for tomorrow AM.  [facility] Resident 04/23/2019 2:26 PM

## 2019-04-23 NOTE — Consult Note (Signed)
[place] Nurse wound follow up [place] assessed sacrum wound yesterday.  Bedside nurse sent a secure chat message requesting assessment of bilat plantar feet.  [place] team is working remotely today; I will plan to assess bilat feet on Wed morning, and will place topical treatment orders for the bedside nurses as follows: Apply Bactroban and foam dressing to bilat feet dark areas. (Change foam dressing Q 3 days or PRN soiling).  I will adjust the plan of care Wed if necessary. Julien Girt MSN, RN, [place], [place], [place]

## 2019-04-23 NOTE — Progress Notes (Signed)
[facility] Hospital Day(s): 2.   Post op day(s):  Colton Lamb   Interval History: Patient seen and examined, no acute events or new complaints overnight. Patient reports feeling better.  Patient was transferred from ICU to regular floor.  He denies any pain in the sacral area.  There is no pain radiation.  There is not alleviating or aggravating factor.  Vital signs in last 24 hours: [min-max] current  Temp:  [97.1 F (36.2 C)-97.9 F (36.6 C)] 97.8 F (36.6 C) (09/22 1454) Pulse Rate:  [81-106] 99 (09/22 1454) Resp:  [13-24] 16 (09/22 1454) BP: (85-137)/(61-84) 96/84 (09/22 1454) SpO2:  [95 %-100 %] 100 % (09/22 1454)     Height: 5' 11"  (180.3 cm) Weight: 120.4 kg BMI (Calculated): 37.04   Physical Exam:  Constitutional: alert, cooperative and no distress  Respiratory: breathing non-labored at rest  Gastrointestinal: soft, non-tender, and non-distended Skin: Large sacral ulcer, stage IV.  Decreased necrotic tissue which is basically tendon.  There is no necrotic muscle or fat.  There was no active bleeding.  Borders with adequate granulation tissue.  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 With scissors, necrotic tissue from the sacral ulcer bed was resected. Ligament  And bone were debrided. Hemostasis achieved with pressure. Wet to dry dressing was applied. Patient tolerated the procedure well.  Sacral ulcer after debridement    Labs:  CBC Latest Ref Rng & Units 04/23/2019 04/23/2019 04/23/2019  WBC 4.0 - 10.5 K/uL - - 12.8(H)  Hemoglobin 13.0 - 17.0 g/dL 8.7(L) 8.3(L)  8.3(L)  Hematocrit 39.0 - 52.0 % 28.2(L) 26.8(L) 26.7(L)  Platelets 150 - 400 K/uL - - 283   CMP Latest Ref Rng & Units 04/23/2019 04/22/2019 04/21/2019  Glucose 70 - 99 mg/dL 130(H) 116(H) 132(H)  BUN 8 - 23 mg/dL 25(H) 26(H) 26(H)  Creatinine 0.61 - 1.24 mg/dL 1.09 1.09 1.06  Sodium 135 - 145 mmol/L 137 135 138  Potassium 3.5 - 5.1 mmol/L 4.7 4.6 5.5(H)  Chloride 98 - 111 mmol/L 108 106 108  CO2 22 - 32 mmol/L 22 19(L) 22  Calcium 8.9 - 10.3 mg/dL 7.8(L) 7.7(L) 7.7(L)  Total Protein 6.5 - 8.1 g/dL - - 5.1(L)  Total Bilirubin 0.3 - 1.2 mg/dL - - 0.8  Alkaline Phos 38 - 126 U/L - - 174(H)  AST 15 - 41 U/L - - 33  ALT 0 - 44 U/L - - 14    Imaging studies: No new pertinent imaging studies   Assessment/Plan:  68 y.o. male with large stage IV bleeding sacral ulcer, complicated by pertinent comorbidities including atrial flutter, over anticoagulated, hypertension, coronary disease. Today patient had a second debridement.  The second debridement the tissue looks much better with deep granulation tissue.  There is no abscess and no active bleeding.  There was no blood clots.  I considered that this patient can be managed in a wound care center with serial debridements.  In the meantime he should be treated with Santyl collagenase at the base of the ulcer.  The borders of the ulcer does not need Santyl because of the adequate granulation tissue that already has.  Another alternative for this patient is been evaluated by a plastic  and reconstructive surgeon to consider 1 of 2 options: Rotating flaps or considering a acellular matrix to promote tissue growth.  Dissection can be evaluated as outpatient.  From the sacral ulcer aspect, patient can be discharged and followed by the wound care center.  Arnold Long, MD

## 2019-04-24 DIAGNOSIS — Z515 Encounter for palliative care: Secondary | ICD-10-CM

## 2019-04-24 DIAGNOSIS — L89150 Pressure ulcer of sacral region, unstageable: Secondary | ICD-10-CM

## 2019-04-24 DIAGNOSIS — A419 Sepsis, unspecified organism: Principal | ICD-10-CM

## 2019-04-24 DIAGNOSIS — Z7189 Other specified counseling: Secondary | ICD-10-CM

## 2019-04-24 LAB — BASIC METABOLIC PANEL
Anion gap: 8 (ref 5–15)
BUN: 30 mg/dL — ABNORMAL HIGH (ref 8–23)
CO2: 18 mmol/L — ABNORMAL LOW (ref 22–32)
Calcium: 7.9 mg/dL — ABNORMAL LOW (ref 8.9–10.3)
Chloride: 111 mmol/L (ref 98–111)
Creatinine, Ser: 1.53 mg/dL — ABNORMAL HIGH (ref 0.61–1.24)
GFR calc Af Amer: 53 mL/min — ABNORMAL LOW (ref >60)
GFR calc non Af Amer: 46 mL/min — ABNORMAL LOW (ref >60)
Glucose, Bld: 126 mg/dL — ABNORMAL HIGH (ref 70–99)
Potassium: 4.5 mmol/L (ref 3.5–5.1)
Sodium: 137 mmol/L (ref 135–145)

## 2019-04-24 LAB — GLUCOSE, CAPILLARY
Glucose-Capillary: 108 mg/dL — ABNORMAL HIGH (ref 70–99)
Glucose-Capillary: 107 mg/dL — ABNORMAL HIGH (ref 70–99)
Glucose-Capillary: 129 mg/dL — ABNORMAL HIGH (ref 70–99)
Glucose-Capillary: 126 mg/dL — ABNORMAL HIGH (ref 70–99)

## 2019-04-24 LAB — PROCALCITONIN: Procalcitonin: 0.37 ng/mL

## 2019-04-24 MED ORDER — SODIUM CHLORIDE 0.9 % IV BOLUS
500.0000 mL | Freq: Once | INTRAVENOUS | Status: AC
  Administered 2019-04-24: 11:00:00 500 mL via INTRAVENOUS

## 2019-04-24 MED ORDER — COLLAGENASE 250 UNIT/GM EX OINT
TOPICAL_OINTMENT | Freq: Every day | CUTANEOUS | Status: DC
  Administered 2019-04-24 – 2019-04-26 (×3): via TOPICAL
  Filled 2019-04-24 (×2): qty 30

## 2019-04-24 MED ORDER — SODIUM CHLORIDE 0.9 % IV BOLUS
500.0000 mL | Freq: Once | INTRAVENOUS | Status: AC
  Administered 2019-04-24: 19:00:00 500 mL via INTRAVENOUS

## 2019-04-24 MED ORDER — VANCOMYCIN HCL 1.25 G IV SOLR
1250.0000 mg | INTRAVENOUS | Status: DC
  Administered 2019-04-24: 1250 mg via INTRAVENOUS
  Filled 2019-04-24: qty 1250

## 2019-04-24 MED ORDER — OXYBUTYNIN CHLORIDE 5 MG PO TABS
5.0000 mg | ORAL_TABLET | Freq: Three times a day (TID) | ORAL | Status: DC
  Administered 2019-04-24 – 2019-04-26 (×7): 5 mg via ORAL
  Filled 2019-04-24 (×7): qty 1

## 2019-04-24 NOTE — Consult Note (Addendum)
Pharmacy Antibiotic Note  Colton Lamb is a 68 y.o. male admitted on 04/21/2019 with sepsis. Patient is a 68 y/o M BIBEMS from nursing home with shortness of breath and stage 4 sacral wound c/b bleeding. Necrotic tissue of ulcer debrided at bedside. Deep injury to the bone. Pharmacy has been consulted for vancomycin & cefepime dosing. Patient is also on metronidazole.    Plan: Continue cefepime 2g Q8H.  Change vancomycin to 1250 mg IV q24h for worsening renal function Goal AUC 400-550. Expected AUC: 458 Cmin: 11.3 SCr used: 1.53  Plan to obtain levels in 3-4 days if patient remains on therapy. Follow renal function.  ADDENDUM 9/23 pm: discontinue vancomycin and continue cefepime and Flagyl for now. Will follow up need for antibiotics if surgery does not suspect wound to be infected.  Height: 5' 11"  (180.3 cm) Weight: 265 lb 6.9 oz (120.4 kg) IBW/kg (Calculated) : 75.3  Temp (24hrs), Avg:97.6 F (36.4 C), Min:97.5 F (36.4 C), Max:97.8 F (36.6 C)  Recent Labs  Lab 04/21/19 1109 04/21/19 1413 04/21/19 1645 04/21/19 2217 04/22/19 0605 04/23/19 0139 04/24/19 0502  WBC 18.5*  --   --  13.6* 10.4 12.8*  --   CREATININE 1.07  --   --  1.06 1.09 1.09 1.53*  LATICACIDVEN 4.7* 3.3* 2.5* 1.4  --   --   --     Estimated Creatinine Clearance: 61 mL/min (A) (by C-G formula based on SCr of 1.53 mg/dL (H)).    Allergies  Allergen Reactions  . Iodine Other (See Comments)    Shortness of breath, swelling and hives  . Shrimp [Shellfish Allergy] Other (See Comments)    SWELLING    HIVES    SHORTNESS OF BREATH  . Tetracycline Rash    Antimicrobials this admission: Vancomycin 9/20 >> 9/23 Cefepime 9/20 >>  Metronidazole 9/20 >>  Dose adjustments this admission: 9/21 vancomycin dose changed from 1215m Q18H to 1005mQ12H. 9/23 vancomycin 1000 mg q12h >> 1250 q24h  Microbiology results: 9/20 BCx: NGTD 9/20 MRSA PCR: negative  Thank you for allowing pharmacy to be a part  of this patient's care.  AbTawnya CrookPharmD 04/24/2019 1:35 PM

## 2019-04-24 NOTE — Progress Notes (Signed)
Nutrition Follow-up  DOCUMENTATION CODES:   Obesity unspecified  INTERVENTION:  Continue Nepro Shake po TID, each supplement provides 425 kcal and 19 grams protein.  Continue Ocuvite daily for wound healing (provides zinc, vitamin A, vitamin C, Vitamin E, copper, and selenium).  Continue vitamin C 500 mg BID.  NUTRITION DIAGNOSIS:   Increased nutrient needs related to wound healing as evidenced by estimated needs.  Ongoing.  GOAL:   Patient will meet greater than or equal to 90% of their needs  Progressing.  MONITOR:   PO intake, Supplement acceptance, Labs, Weight trends, I & O's  REASON FOR ASSESSMENT:   Consult Assessment of nutrition requirement/status  ASSESSMENT:   68 year old male with PMHx of Crohn's disease, GERD, CAD, HTN, paroxysmal A-fib, ischemic cardiomyopathy, HLD, sleep apnea, DM type 2 admitted from SNF with septic shock, PNA, infected decubitus ulcer with significant amount of bleeding s/p bedside debridement, acute blood loss anemia.  Patient reports his appetite is improving. He is now eating about 50-75% of most of his meals. He did eat 100% of dinner last night. He reports he is drinking his Nepro between meals.   Medications reviewed and include: Novolog 0-9 units TID, Novolog 0-5 units QHS, Novolog 6 units TID, Levemir 10 units daily, lactobacillus 1 gram TID, Ocuvite daily, niacin 1000 mg daily, pantoprazole, Flomax, vitamin C 500 mg BID, cefepime, Flagyl, vancomycin.  Labs reviewed: CBG 97-126, CO2 18, BUN 30, Creatinine 1.53.  Diet Order:   Diet Order            Diet heart healthy/carb modified Room service appropriate? Yes; Fluid consistency: Thin  Diet effective now             EDUCATION NEEDS:   Education needs have been addressed  Skin:  Skin Assessment: Skin Integrity Issues:(stg IV sacrum; stg II scrotum)  Last BM:  04/23/2019 - large type 6  Height:   Ht Readings from Last 1 Encounters:  04/21/19 5' 11"  (1.803 m)    Weight:   Wt Readings from Last 1 Encounters:  04/21/19 120.4 kg   Ideal Body Weight:  78.2 kg  BMI:  Body mass index is 37.02 kg/m.  Estimated Nutritional Needs:   Kcal:  4496-7591  Protein:  115-125 grams  Fluid:  per MD  Willey Blade, MS, RD, LDN Office: [phone] Pager: [phone] After Hours/Weekend Pager: [phone]

## 2019-04-24 NOTE — Progress Notes (Signed)
Colton Lamb at Colton Lamb NAME: Colton Lamb    MR#:  [id number]  DATE OF BIRTH:  1950-11-24  SUBJECTIVE:  Came in with bleeding of his chronic decubitus with foul smell More awake. drinking ensure, feels tired, remains hypotensive REVIEW OF SYSTEMS:   Review of Systems  Constitutional: Positive for malaise/fatigue. Negative for diaphoresis, fever and weight loss.  HENT: Negative for ear discharge, ear pain, hearing loss, nosebleeds, sore throat and tinnitus.   Eyes: Negative for blurred vision and pain.  Respiratory: Negative for cough, hemoptysis, shortness of breath and wheezing.   Cardiovascular: Negative for chest pain, palpitations, orthopnea and leg swelling.  Gastrointestinal: Negative for abdominal pain, blood in stool, constipation, diarrhea, heartburn, nausea and vomiting.  Genitourinary: Negative for dysuria, frequency and urgency.  Musculoskeletal: Negative for back pain and myalgias.  Skin: Negative for itching and rash.  Neurological: Negative for dizziness, tingling, tremors, focal weakness, seizures, weakness and headaches.  Psychiatric/Behavioral: Negative for depression. The patient is not nervous/anxious.    Tolerating Diet:yes Tolerating PT:   DRUG ALLERGIES:   Allergies  Allergen Reactions  . Iodine Other (See Comments)    Shortness of breath, swelling and hives  . Shrimp [Shellfish Allergy] Other (See Comments)    SWELLING    HIVES    SHORTNESS OF BREATH  . Tetracycline Rash    VITALS:  Blood pressure (!) 88/63, pulse 70, temperature (!) 97.5 F (36.4 C), temperature source Oral, resp. rate 20, height 5' 11"  (1.803 m), weight 120.4 kg, SpO2 100 %.  PHYSICAL EXAMINATION:   Physical Exam  GENERAL:  68 y.o.-year-old patient lying in the bed with no acute distress. crtically ill EYES: Pupils equal, round, reactive to light and accommodation. No scleral icterus. Extraocular muscles intact.  HEENT: Head  atraumatic, normocephalic. Oropharynx and nasopharynx clear.  NECK:  Supple, no jugular venous distention. No thyroid enlargement, no tenderness.  LUNGS: Normal breath sounds bilaterally, no wheezing, rales, rhonchi. No use of accessory muscles of respiration.  CARDIOVASCULAR: S1, S2 normal. No murmurs, rubs, or gallops.  ABDOMEN: Soft, nontender, nondistended. Bowel sounds present. No organomegaly or mass.  EXTREMITIES: No cyanosis, clubbing or edema b/l.    NEUROLOGIC: grossly non focal PSYCHIATRIC:  patient issleepy  SKIN:      LABORATORY PANEL:  CBC Recent Labs  Lab 04/23/19 0139  04/23/19 1326  WBC 12.8*  --   --   HGB 8.3*   < > 8.7*  HCT 26.7*   < > 28.2*  PLT 283  --   --    < > = values in this interval not displayed.    Chemistries  Recent Labs  Lab 04/21/19 2217  04/23/19 0139 04/24/19 0502  NA 138   < > 137 137  K 5.5*   < > 4.7 4.5  CL 108   < > 108 111  CO2 22   < > 22 18*  GLUCOSE 132*   < > 130* 126*  BUN 26*   < > 25* 30*  CREATININE 1.06   < > 1.09 1.53*  CALCIUM 7.7*   < > 7.8* 7.9*  MG 1.9  --  2.0  --   AST 33  --   --   --   ALT 14  --   --   --   ALKPHOS 174*  --   --   --   BILITOT 0.8  --   --   --    < > =  values in this interval not displayed.   Cardiac Enzymes No results for input(s): TROPONINI in the last 168 hours. RADIOLOGY:  No results found. ASSESSMENT AND PLAN:  68 year old male with a medical history chronic systolic congestive heart failure with EF of 20 to 25%, CAD, atrial fibrillation/flutter on currently on Xarelto for  complex medical history, currently a SNF resident, who presented to the ED for evaluation of bleeding from sacral ulcer.  Patient was recently hospitalized with severe pneumonia and discharged to SNF  1. Septic shock secondary to ?pneumonia and  infected decubitus ulcer with significant amount of bleeding causing acute blood loss anemia -continue IV  cefepime -surgical consultation was obtained by Dr.  Windell Moment and bedside debridement was done. Patient has extensive wound up to the bone.   -Aggressive wound dressing-- Wound care nurse is following -follow blood culture and trend antibiotics accordingly  2. History of a flutter fib on Xarelto -holding Xarelto given significant bleeding from decubitus -continue metoprolol for rate control  3.Hyperkalemia--- Came in K of 5.5--4.6  4.Acute blood loss anemia due to significant bleeding from the decubitus -hemoglobin is 8.7 -hgb 6.7---6.6--3 units BT -pt's INR is elevated due to xarelto  5.lactic acidosis due to infection -improving  6.  Acute renal failure -Monitor this could be ATN due to significant hypotension -Avoid nephrotoxic medication, consider nephrology consultation  Palliative care following.  Patient would like aggressive care and full code for now  -Patient prefers to go different facility.  We will ask social worker to look into it   CODE STATUS: FULL code  DVT Prophylaxis: SCD  TOTAL TIME TAKING CARE OF THIS PATIENT: *30* minutes.  >50% time spent on counselling and coordination of care  POSSIBLE D/C IN 1-2 DAYS, DEPENDING ON CLINICAL CONDITION.  Note: This dictation was prepared with Dragon dictation along with smaller phrase technology. Any transcriptional errors that result from this process are unintentional.  Max Sane M.D on 04/24/2019 at 1:15 PM  Between 7am to 6pm - Pager - [phone]  After 6pm go to [email or web address] - password [email or web address] [name]  Office  [phone]  CC: Primary care physician; Toni Arthurs, NPPatient ID: Colton Lamb, male   DOB: 05/18/1951, 68 y.o.   MRN: [id number]

## 2019-04-24 NOTE — Progress Notes (Signed)
PT Cancellation Note  Patient Details Name: Colton Lamb MRN: [id number] DOB: 26-Aug-1950   Cancelled Treatment:    Reason Eval/Treat Not Completed: Patient not medically ready.  Per discussion with pt's nurse, pt continues to have low BP issues and recommending holding PT eval at this time.  Will re-attempt PT evaluation tomorrow.  Leitha Bleak, PT 04/24/19, 3:17 PM [phone]

## 2019-04-24 NOTE — Progress Notes (Signed)
Patient placed on air loss mattress as ordered. Wound care completed per order. Patient tolerated well. Madlyn Frankel, RN

## 2019-04-24 NOTE — TOC Initial Note (Signed)
Transition of Care [facility]) - Initial/Assessment Note    Patient Details  Name: Colton Lamb MRN: [id number] Date of Birth: 1951-06-16  [facility] of [name]) CM/SW Contact:    Candie Chroman, LCSW Phone Number: 04/24/2019, 3:00 PM  Clinical Narrative:  Readmission prevention screen complete. CSW met with patient. No supports at bedside. CSW introduced role and explained that discharge planning would be discussed. Patient confirmed he was admitted from [facility] but does not want to return at discharge. Patient prefers to look for another SNF. No preference. [facility] was at [facility] from 8/18-9/20 (~33 days). He does have secondary insurance to cover copay up to day 100. CSW provided list of CMS scores for facilities within 25 miles of his zip code. Encouraged him to pick out top 3 preferences. Will send out referral once PT evaluation complete. No further concerns. CSW encouraged patient to contact CSW as needed. CSW will continue to follow patient for support and facilitate discharge to SNF once medically stable.                Expected Discharge Plan: Skilled Nursing Facility Barriers to Discharge: Continued Medical Work up   Patient Goals and CMS Choice   CMS Medicare.gov Compare Post Acute Care list provided to:: Patient    Expected Discharge Plan and Services Expected Discharge Plan: [place] Choice: [place] arrangements for the past 2 months: [place], [place]                                      Prior Living Arrangements/Services Living arrangements for the past 2 months: [place], [place] Lives with:: Self Patient language and need for interpreter reviewed:: Yes Do you feel safe going back to the place where you live?: Yes      Need for Family Participation in Patient Care: Yes (Comment) Care giver support system in place?: No (comment)(Will  be looking into another SNF.) Current home services: DME Criminal Activity/Legal Involvement Pertinent to Current Situation/Hospitalization: No - Comment as needed  Activities of Daily Living Home Assistive Devices/Equipment: Wheelchair ADL Screening (condition at time of admission) Patient's cognitive ability adequate to safely complete daily activities?: Yes Is the patient deaf or have difficulty hearing?: No Does the patient have difficulty seeing, even when wearing glasses/contacts?: No Does the patient have difficulty concentrating, remembering, or making decisions?: No Patient able to express need for assistance with ADLs?: Yes Does the patient have difficulty dressing or bathing?: No Independently performs ADLs?: Yes (appropriate for developmental age) Does the patient have difficulty walking or climbing stairs?: No Weakness of Legs: Both Weakness of Arms/Hands: None  Permission Sought/Granted Permission sought to share information with : Facility Art therapist granted to share information with : Yes, Verbal Permission Granted     Permission granted to share info w AGENCY: SNF's        Emotional Assessment Appearance:: Appears stated age Attitude/Demeanor/Rapport: Engaged, Gracious Affect (typically observed): Accepting, Appropriate, Calm, Pleasant Orientation: : Oriented to Self, Oriented to Place, Oriented to  Time, Oriented to Situation Alcohol / Substance Use: Never Used Psych Involvement: No (comment)  Admission diagnosis:  Pressure injury of sacral region, unstageable ([place]) [L89.150] Sepsis, due to unspecified organism, unspecified whether acute organ dysfunction present ([place]) [A41.9] Sepsis ([place]) [A41.9] Patient Active Problem List   Diagnosis  Date Noted  . Acute blood loss anemia   . Pressure injury of skin 03/17/2019  . Acute renal failure (ARF) ([place])   . Empyema lung ([place])   . Shortness of breath   . Pleural effusion on right   .  Sepsis ([place]) 03/04/2019  . Restless legs syndrome (RLS) 06/16/2017  . Hypertension, essential 06/16/2017  . Coronary artery disease of native artery of native heart with stable angina pectoris ([place]) 04/27/2017  . Hydrocephalus ([place]) 09/08/2016  . Dizziness and giddiness   . Elevated troponin 09/06/2016  . Type II diabetes mellitus ([place])   . Ischemic cardiomyopathy   . Hyponatremia 07/01/2015  . Diabetes mellitus type 2, uncontrolled, with complications ([place]) 59/96/8957  . 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 OSA (obstructive sleep apnea) 12/20/2013  . Morbid obesity ([place]) 12/20/2013  . AKI (acute kidney injury) - Creatinine improved at d/c 12/14/2013  . Elevated TSH - will need f/u TFTs with PCP in 3-4 weeks 12/14/2013  . Junctional bradycardia - resolved 12/14/2013  . Syncope - due to bradycardia in setting of Digoxin Toxicity 12/11/2013  . Chronic systolic heart failure ([place]) 12/06/2013  . At risk for sudden cardiac death - on LifeVest 12/16/2013  . Acute combined systolic and diastolic CHF, NYHA class 3 -- 2/2 MI 16-Dec-2013  . Cardiomyopathy, ischemic 12-16-2013  . Atrial flutter ([place]) 11/22/2013  . NSTEMI (non-ST elevated myocardial infarction) ([place]) 11/22/2013  . Long term current use of anticoagulant 10/19/2010  . Hyperlipidemia 05/06/2010  . Edema 05/06/2010  . CAD S/P percutaneous coronary angioplasty - multiple PCIs 11/11/2008  . Atrial fibrillation ([place]) 11/11/2008  . GERD 11/11/2008  . [place] INTESTINE 11/11/2008  . COLONIC POLYPS, HX OF 11/11/2008  . [place] ALLERGY 11/11/2008   PCP:  Toni Arthurs, NP Pharmacy[facility]   [id number] [place] [place] [address]Phone: [phone]: [phone] [facility] [id number]: [phone]:  [phone]    Social Determinants of Health (SDOH) Interventions    Readmission Risk Interventions Readmission Risk Prevention Plan 04/24/2019 03/11/2019  Transportation Screening Complete Complete  Social Work Consult for [place]/Counseling - Complete  Palliative Care Screening - Not Applicable  Medication Review (Press photographer Complete Complete  PCP or Specialist appointment within 3-5 days of discharge Complete -  [place](No Data) -  SW Recovery Care/Counseling Consult Complete -  Palliative Care Screening Complete -  Skilled Nursing Facility Complete -  Some recent data might be hidden

## 2019-04-24 NOTE — Progress Notes (Signed)
PT Cancellation Note  Patient Details Name: Colton Lamb MRN: [id number] DOB: Dec 25, 1950   Cancelled Treatment:    Reason Eval/Treat Not Completed: Patient not medically ready.  Chart reviewed.  Nurse recommending holding PT at this time d/t low BP concerns.  Will re-attempt PT evaluation at a later date/time as medically appropriate.  Leitha Bleak, PT 04/24/19, 10:03 AM [phone]

## 2019-04-24 NOTE — Consult Note (Addendum)
[facility] Nurse wound follow-up consult note Reason for Consult: Surgical team following for assessment and plan of care for sacrum stage 4 pressure injury.  They performed another bedside debridement yesterday to assist with removal of nonviable tissue and suggested Santyl be ordered for daily topical treatment and patient should be followed as an outpatient at the wound care center; this is requested by physician referral  Orders placed for bedside nurse to perform dressing changes daily with Santyl and moist gauze packing to assist with removal of nonviable tissue.  Order placed for low airloss mattress to reduce pressure to sacrum now that patient is no longer in ICU on a support surface.  Requested to assess bilat plantar feet.  Ptr has several areas of dry, darker-colored calluses which remove easily when scrubbed with moist gauze.  He states he was followed in the past by a podiatrist who assisted with trimming the areas.  Left heel with darker-colored dry callous, this is not a pressure injury, 2X2cm area removes easily revealing pink dry skin without open wound or drainage. Left plantar foot with 3X3cm darker-colored dry callous, this is not a pressure injury, 2X2cm area removes easily revealing pink dry skin without open wound or drainage. Right plantar foot with black hard dry callous, tightly adhered but lifts at the edges and removes, revealing pink moist partial thickness skin loss. No odor or drainage, 1X1X.1cm.  This is not a pressure injury Dressing procedure/placement/frequency: Continue plan of care as previously ordered with Bactroban to promote moist healing and foam dressing to protect from further injury.  Float heels to reduce pressure.  Please refer to surgical team for further questions regarding sacrum wound and re-consult if further assistance is needed.  Thank-you,  Julien Girt MSN, [place], [place], [place], [place]

## 2019-04-24 NOTE — Consult Note (Signed)
Consultation Note Date: 04/24/2019   Patient Name: Colton Lamb  DOB: 1951/01/25  MRN: [id number]  Age / Sex: 68 y.o., male  PCP: Toni Arthurs, NP Referring Physician: Max Sane, MD  Reason for Consultation: Establishing goals of care  HPI/Patient Profile: The patient was sent from nursing home to ED due to above chief complaints.  The patient denies any fever or chills but has shortness of breath and the cough.  He was recently admitted for pneumonia and empyema.  He developed sacral DU in the hospital and was sent to nursing facility after discharge.  Per Dr. Corky Downs, the patient also has sacral wound bleeding for the past few days.  Clinical Assessment and Goals of Care: Patient is resting in bed. He states he has been widowed for 7 years. He has 2 step children, 1 of which he does not see. He states the other stepchild, [name], is his HPOA.   Prior to last admission in August for PNA and empyema, functionally, he states he was active and required no assistance. He states following last admission, he went in to rehab where he continued to decline and decondition as he could not work with therapy. He states prior to this admission, he is able to take a few steps, but used a wheelchair primarily. Mr. Dulworth tells me he has not been out of bed in a week. His appetite has been good.   His goal is to be able to return to his previous functional status prior to August, and help his sister care for his mother who has dementia.    We discussed his diagnoses, prognosis, and GOC.   A detailed discussion was had today regarding advanced directives.  Concepts specific to code status, artifical feeding and hydration, IV antibiotics and rehospitalization were discussed.  The difference between an aggressive medical intervention path and a comfort care path was discussed.  Values and goals of care important to patient  and family were attempted to be elicited.  Discussed limitations of medical interventions to prolong quality of life in some situations and discussed the concept of human mortality.  He states he may be willing to have surgery, but not serial surgeries. He would be willing to be seen in the wound care center.   He states he has a living will. He tells me "I want to live, I'm not ready to leave this earth." He states he would never want a feeding tube. He would never want a ventilator. He would want CPR. Discussed the likely need for a ventilator following ROSC, he states he will consider his decision for DNR status.   He states his current QOL would not be acceptable to him long term. There is concern for his ability to heal a sacral flap, or to heal his wounds through wound care with his deconditioning and constant pressure on the area.    SUMMARY OF RECOMMENDATIONS   Wants CPR. No intubation. No feeding tube.   Recommend palliative at D/C to continue conversations.  Prognosis:   Poor overall  Discharge Planning: [facility] for rehab with Palliative care service follow-up      Primary Diagnoses: Present on Admission:  Sepsis ([place])   I have reviewed the medical record, interviewed the patient and family, and examined the patient. The following aspects are pertinent.  Past Medical History:  Diagnosis Date   Atrial flutter ([place])    a. s/p Cardioversion 11/22/13, on amiodarone and Xarelto.   Chronic systolic heart failure ([name])    a. 10/2013 EF 20-25%, grade III DD, RV mildly dilated and sys fx mild/mod reduced;  b. 01/2014 Echo: EF 30-35%, gr3 DD, mod dil LA.   Coronary artery disease    a. s/p MI 2007/2015;  b. s/p prior PCI to the LAD/LCX/PDA/PL;  c. 2008: s/p Cypher DES to the OM.   Crohn's ileocolitis ([place])    GERD (gastroesophageal reflux disease)    Hx of adenomatous colonic polyps 11/2003   Hyperlipidemia    Hypertension    Ischemic  cardiomyopathy    s. 01/2014 s/p MDT [id number] [name] XT DR single lead AICD.   Obesity    Paroxysmal atrial fibrillation ([facility])    a. CHA2DS2VASc = 4-->xarelto/amio.   Sleep apnea    Syncope    a.  11/2013 in setting of volume depletion and bradycardia due to dig toxicity    Type II diabetes mellitus ([place])    Social History   Socioeconomic History   Marital status: Widowed    Spouse name: Not on file   Number of children: 1   Years of education: Not on file   Highest education level: Not on file  Occupational History   Occupation: retired    Fish farm manager: RETIRED  Scientist, product/process development strain: Not on file   Food insecurity    Worry: Not on file    Inability: Not on file   Transportation needs    Medical: Not on file    Non-medical: Not on file  Tobacco Use   Smoking status: Never Smoker   Smokeless tobacco: Never Used  Substance and Sexual Activity   Alcohol use: No   Drug use: No   Sexual activity: Never  Lifestyle   Physical activity    Days per week: Not on file    Minutes per session: Not on file   Stress: Not on file  Relationships   Social connections    Talks on phone: Not on file    Gets together: Not on file    Attends religious service: Not on file    Active member of club or organization: Not on file    Attends meetings of clubs or organizations: Not on file    Relationship status: Not on file  Other Topics Concern   Not on file  Social History Narrative   Not on file   Family History  Problem Relation Age of Onset   Breast cancer Mother    Heart disease Father    Heart attack Father    Colon cancer Neg Hx    Scheduled Meds:  amiodarone  200 mg Oral Daily   aspirin  81 mg Oral Daily   atorvastatin  80 mg Oral Daily   Chlorhexidine Gluconate Cloth  6 each Topical Daily   collagenase   Topical Daily   feeding supplement (NEPRO CARB STEADY)  237 mL Oral TID BM   finasteride  5 mg Oral Daily   influenza  vaccine adjuvanted  0.5 mL Intramuscular Tomorrow-1000  insulin aspart  0-5 Units Subcutaneous QHS   insulin aspart  0-9 Units Subcutaneous TID WC   insulin aspart  6 Units Subcutaneous TID AC   insulin detemir  10 Units Subcutaneous Daily   lactobacillus  1 g Oral TID WC   metoprolol succinate  37.5 mg Oral Daily   multivitamin-lutein  1 capsule Oral Daily   mupirocin cream   Topical Daily   niacin  1,000 mg Oral Daily   pantoprazole  40 mg Oral Daily   pneumococcal 23 valent vaccine  0.5 mL Intramuscular Tomorrow-1000   tamsulosin  0.4 mg Oral QPC breakfast   vitamin C  500 mg Oral BID   Continuous Infusions:  ceFEPime (MAXIPIME) IV 2 g (04/24/19 7371)   metronidazole Stopped (04/24/19 0313)   vancomycin     PRN Meds:.acetaminophen **OR** acetaminophen, albuterol, bisacodyl, HYDROcodone-acetaminophen, morphine injection, nitroGLYCERIN, ondansetron **OR** ondansetron (ZOFRAN) IV, senna-docusate Medications Prior to Admission:  Prior to Admission medications   Medication Sig Start Date End Date Taking? Authorizing Provider  acetaminophen (TYLENOL) 325 MG tablet Take 1-2 tablets (325-650 mg total) by mouth every 4 (four) hours as needed for mild pain. 04/17/14  Yes [name], NP  Albuterol Sulfate 108 (90 Base) MCG/ACT AEPB Inhale 1 puff into the lungs every 6 (six) hours as needed (shortness of breath).    Yes [provider]  amiodarone (PACERONE) 200 MG tablet Take 1 tablet (200 mg total) by mouth daily. 03/20/19  Yes Wieting, Richard, MD  aspirin 81 MG tablet Take 81 mg by mouth daily.    Yes [provider]  atorvastatin (LIPITOR) 80 MG tablet TAKE 1 TABLET BY MOUTH EVERY DAY Patient taking differently: Take 80 mg by mouth daily.  03/08/19  Yes Gollan, Kathlene November, MD  balsalazide (COLAZAL) 750 MG capsule TAKE 1 CAPSULE (750 MG TOTAL) BY MOUTH 3 (THREE) TIMES DAILY. Patient taking differently: Take 750 mg by mouth 3 (three) times daily.  11/28/18   Yes Ladene Artist, MD  finasteride (PROSCAR) 5 MG tablet Take 1 tablet (5 mg total) by mouth daily. 03/20/19  Yes Wieting, Richard, MD  insulin aspart (NOVOLOG) 100 UNIT/ML injection Inject 5 Units into the skin 3 (three) times daily with meals. Patient taking differently: Inject 6 Units into the skin 3 (three) times daily before meals.  03/19/19  Yes Wieting, Richard, MD  insulin detemir (LEVEMIR) 100 UNIT/ML injection Inject 0.13 mLs (13 Units total) into the skin daily. Patient taking differently: Inject 20 Units into the skin daily.  03/19/19  Yes Wieting, Richard, MD  lactobacillus (FLORANEX/LACTINEX) PACK Take 1 packet (1 g total) by mouth 3 (three) times daily with meals. 03/19/19  Yes Wieting, Richard, MD  metoprolol succinate (TOPROL-XL) 25 MG 24 hr tablet Take 1 tablet (25 mg total) by mouth daily. Patient taking differently: Take 37.5 mg by mouth daily.  03/19/19  Yes Wieting, Richard, MD  Multiple Vitamins-Minerals (MULTIVITAMIN WITH MINERALS) tablet Take 1 tablet by mouth daily.   Yes [provider]  nitroGLYCERIN (NITROSTAT) 0.4 MG SL tablet Place 0.4 mg under the tongue every 5 (five) minutes as needed for chest pain (MAX 3 TABLETS).    Yes [provider]  Nutritional Supplements (FEEDING SUPPLEMENT, NEPRO CARB STEADY,) LIQD Take 237 mLs by mouth 2 (two) times daily between meals. 03/19/19  Yes Wieting, Richard, MD  omeprazole (PRILOSEC) 20 MG capsule Take 20 mg by mouth daily.    Yes [provider]  potassium chloride SA (K-DUR) 20  MEQ tablet Take 1 tablet (20 mEq total) by mouth daily. 04/09/19  Yes Gollan, Kathlene November, MD  sodium hypochlorite (DAKIN'S 1/2 STRENGTH) external solution Irrigate with 1 application as directed 2 (two) times daily. (apply to sacral wound)   Yes [provider]  tamsulosin (FLOMAX) 0.4 MG CAPS capsule Take 1 capsule (0.4 mg total) by mouth daily after breakfast. 03/20/19  Yes Wieting, Richard, MD  vitamin C (ASCORBIC ACID)  500 MG tablet Take 500 mg by mouth 2 (two) times daily.   Yes [provider]  XARELTO 20 MG TABS tablet TAKE 1 TABLET BY MOUTH EVERY DAY WITH LUNCH Patient taking differently: Take 20 mg by mouth daily.  11/23/18  Yes Minna Merritts, MD  niacin (NIASPAN) 1000 MG CR tablet Take 1,000 mg by mouth daily. 04/10/19   [provider]   Allergies  Allergen Reactions   Iodine Other (See Comments)    Shortness of breath, swelling and hives   Shrimp [Shellfish Allergy] Other (See Comments)    SWELLING    HIVES    SHORTNESS OF BREATH   Tetracycline Rash   Review of Systems  All other systems reviewed and are negative.   Physical Exam Pulmonary:     Effort: Pulmonary effort is normal.  Skin:    General: Skin is warm and dry.  Neurological:     Mental Status: He is alert.     Vital Signs: BP (!) 87/74 (BP Location: Left Arm)    Pulse 70    Temp (!) 97.5 F (36.4 C) (Oral)    Resp 20    Ht 5' 11"  (1.803 m)    Wt 120.4 kg    SpO2 100%    BMI 37.02 kg/m  Pain Scale: 0-10   Pain Score: 2    SpO2: SpO2: 100 % O2 Device:SpO2: 100 % O2 Flow Rate: .   IO: Intake/output summary:   Intake/Output Summary (Last 24 hours) at 04/24/2019 5110 Last data filed at 04/24/2019 2111 Gross per 24 hour  Intake 1113.18 ml  Output 550 ml  Net 563.18 ml    LBM: Last BM Date: 04/23/19 Baseline Weight: Weight: 122.5 kg Most recent weight: Weight: 120.4 kg     Palliative Assessment/Data: 40% at best     Time In: 9:30 Time Out: 10:12 Time Total:42 in Greater than 50%  of this time was spent counseling and coordinating care related to the above assessment and plan.  Signed by: Asencion Gowda, NP   Please contact Palliative Medicine Team phone at [phone] for questions and concerns.  For individual provider: See Shea Evans

## 2019-04-25 ENCOUNTER — Encounter: Payer: Medicare Other | Admitting: *Deleted

## 2019-04-25 LAB — CBC
HCT: 27.5 % — ABNORMAL LOW (ref 39.0–52.0)
Hemoglobin: 8.4 g/dL — ABNORMAL LOW (ref 13.0–17.0)
MCH: 26.9 pg (ref 26.0–34.0)
MCHC: 30.5 g/dL (ref 30.0–36.0)
MCV: 88.1 fL (ref 80.0–100.0)
Platelets: 293 10*3/uL (ref 150–400)
RBC: 3.12 MIL/uL — ABNORMAL LOW (ref 4.22–5.81)
RDW: 20 % — ABNORMAL HIGH (ref 11.5–15.5)
WBC: 14.6 10*3/uL — ABNORMAL HIGH (ref 4.0–10.5)
nRBC: 0 % (ref 0.0–0.2)

## 2019-04-25 LAB — BASIC METABOLIC PANEL
Anion gap: 7 (ref 5–15)
BUN: 34 mg/dL — ABNORMAL HIGH (ref 8–23)
CO2: 19 mmol/L — ABNORMAL LOW (ref 22–32)
Calcium: 7.9 mg/dL — ABNORMAL LOW (ref 8.9–10.3)
Chloride: 111 mmol/L (ref 98–111)
Creatinine, Ser: 1.78 mg/dL — ABNORMAL HIGH (ref 0.61–1.24)
GFR calc Af Amer: 44 mL/min — ABNORMAL LOW (ref >60)
GFR calc non Af Amer: 38 mL/min — ABNORMAL LOW (ref >60)
Glucose, Bld: 123 mg/dL — ABNORMAL HIGH (ref 70–99)
Potassium: 4.5 mmol/L (ref 3.5–5.1)
Sodium: 137 mmol/L (ref 135–145)

## 2019-04-25 LAB — GLUCOSE, CAPILLARY
Glucose-Capillary: 116 mg/dL — ABNORMAL HIGH (ref 70–99)
Glucose-Capillary: 114 mg/dL — ABNORMAL HIGH (ref 70–99)
Glucose-Capillary: 110 mg/dL — ABNORMAL HIGH (ref 70–99)
Glucose-Capillary: 133 mg/dL — ABNORMAL HIGH (ref 70–99)
Glucose-Capillary: 102 mg/dL — ABNORMAL HIGH (ref 70–99)

## 2019-04-25 MED ORDER — HYDROCODONE-ACETAMINOPHEN 5-325 MG PO TABS: 1.0000 | ORAL_TABLET | Freq: Four times a day (QID) | ORAL | Status: DC | PRN

## 2019-04-25 MED ORDER — MORPHINE SULFATE (PF) 2 MG/ML IV SOLN: 1.0000 mg | INTRAVENOUS | Status: DC | PRN

## 2019-04-25 NOTE — Evaluation (Signed)
Physical Therapy Evaluation Patient Details Name: Colton Lamb MRN: [id number] DOB: [birth year] Today's Date: 04/25/2019   History of Present Illness  Pt is a 68 y.o. male presenting to hospital from [facility] 9/20 with increasing SOB and bleeding from decubitus ulcer.  Pt admitted with sepsis d/t PNA HAP, lactic acidosis, stage IV sacral decubitus wound bleeding, anemia of chronic disease and active blood loss.  Of note, pt with recent extended hospitalization for PNA with empyema requiring drainage and has been at rehab since.  PMH includes atrial flutter on Xarelto, CHF, CAD, htn, syncope, DM, sleep apnea, RLS, and ICD.  Clinical Impression  Prior to hospital admissions, pt was independent with ambulation (occasional use of walking stick for longer distances) beginning of August of this year.  Pt lives alone in 1 level home with 3 steps to enter with R railing but has been at [facility] since recent hospitalization (recently pt reporting being unable to work with PT d/t L LE issues--pt initially reporting d/t h/o L ankle issues and then reported issues with L knee but then reported it was d/t increased L LE swelling).  Pt requiring encouragement from visitor for physical therapy session participation.  Nurse present during session and requesting to do wound care prior to getting pt OOB.  Minimal assist for pt logrolling to R side.  Pt initially appearing comfortable but then c/o pain from foley catheter (therapist made sure there was no tension/pulling on foley catheter). Pt then reporting he could not participate in therapy anymore d/t foley catheter pain and "everything else".  Therapist asked pt to describe what else was going on and then pt reported he could not participate d/t 10/10 L hip pain and "everything else" (pt initially reporting radiation down entire L LE but then reporting pain was in his L hip).  Nurse present and aware of pt's pain complaints.  Therapist asked pt to describe what else was going on  but pt did not expand on it (visitor present and reported Palliative Care was just here and pt did not receive good news).   Pt would benefit from skilled PT trial to address noted impairments and functional limitations (see below for any additional details).  Upon hospital discharge, pt would benefit from STR trial to attempt to improve strength and functional mobility.    Follow Up Recommendations SNF(trial)    Equipment Recommendations  Wheelchair (measurements PT);Wheelchair cushion (measurements PT);Hospital bed;3in1 (PT);Other (comment)(hoyer lift)    Recommendations for Other Services OT consult     Precautions / Restrictions Precautions Precautions: Fall Precaution Comments: stage IV sacral decubitus ulcer Restrictions Weight Bearing Restrictions: No      Mobility  Bed Mobility Overal bed mobility: Needs Assistance Bed Mobility: Rolling Rolling: Min assist         General bed mobility comments: minimal assist for L hip/L LE to logroll to R side with use of bed rail and then logroll back onto pt's back  Transfers                 General transfer comment: Pt declined any OOB mobility  Ambulation/Gait             General Gait Details: Pt declined any OOB mobility  Stairs            Wheelchair Mobility    Modified Rankin (Stroke Patients Only)       Balance  Pertinent Vitals/Pain Pain Assessment: 0-10 Pain Score: 10-Worst pain ever Pain Location: L hip Pain Descriptors / Indicators: Aching;Guarding;Grimacing Pain Intervention(s): Limited activity within patient's tolerance;Monitored during session;Repositioned;Other (comment)(nurse present and aware of pt's pain)    Home Living Family/patient expects to be discharged to:: Skilled nursing facility Living Arrangements: Alone   Type of Home: Mobile home Home Access: Stairs to enter Entrance Stairs-Rails: Right Entrance  Stairs-Number of Steps: 3 Home Layout: One level Home Equipment: Walker - 2 wheels;Hand held shower head(walking stick)      Prior Function           Comments: Prior to hospitalization 03/2019 pt was independent with occasional use of walking stick for longer distances; has been at [facility] since hospital discharge and recently has been unable to work with PT (pt reporting d/t L LE issues)     Hand Dominance        Extremity/Trunk Assessment   Upper Extremity Assessment Upper Extremity Assessment: Generalized weakness    Lower Extremity Assessment Lower Extremity Assessment: Generalized weakness(decreased ROM L knee flexion (appearing "stiff"))    Cervical / Trunk Assessment Cervical / Trunk Assessment: Normal  Communication   Communication: No difficulties  Cognition Arousal/Alertness: Awake/alert Behavior During Therapy: Anxious Overall Cognitive Status: Within Functional Limits for tasks assessed                                        General Comments   Nursing cleared pt for participation in physical therapy.  Pt agreeable to PT session initially with encouragement from visitor.    Exercises     Assessment/Plan    PT Assessment Patient needs continued PT services  PT Problem List Decreased strength;Decreased range of motion;Decreased activity tolerance;Decreased balance;Decreased mobility;Decreased knowledge of use of DME;Decreased knowledge of precautions;Decreased skin integrity;Pain       PT Treatment Interventions DME instruction;Gait training;Functional mobility training;Therapeutic activities;Therapeutic exercise;Balance training;Patient/family education    PT Goals (Current goals can be found in the Care Plan section)  Acute Rehab PT Goals Patient Stated Goal: to improve mobility PT Goal Formulation: With patient Time For Goal Achievement: 05/09/19 Potential to Achieve Goals: Poor    Frequency Min 2X/week   Barriers to discharge  Decreased caregiver support      Co-evaluation               AM-PAC PT "6 Clicks" Mobility  Outcome Measure Help needed turning from your back to your side while in a flat bed without using bedrails?: A Little Help needed moving from lying on your back to sitting on the side of a flat bed without using bedrails?: Total Help needed moving to and from a bed to a chair (including a wheelchair)?: Total Help needed standing up from a chair using your arms (e.g., wheelchair or bedside chair)?: Total Help needed to walk in hospital room?: Total Help needed climbing 3-5 steps with a railing? : Total 6 Click Score: 8    End of Session   Activity Tolerance: Patient limited by pain Patient left: in bed;with nursing/sitter in room(nurse present doing wound care and reported he would set pt back up when he was done) Nurse Communication: Mobility status;Precautions;Other (comment)(pt's pain status (nurse present and aware)) PT Visit Diagnosis: Other abnormalities of gait and mobility (R26.89);Muscle weakness (generalized) (M62.81);History of falling (Z91.81);Difficulty in walking, not elsewhere classified (R26.2);Pain Pain - Right/Left: Left Pain - part of body: Leg  Time: 1025-1050 PT Time Calculation (min) (ACUTE ONLY): 25 min   Charges:   PT Evaluation $PT Eval Low Complexity: 1 Low         Jaxtyn Linville, PT 04/25/19, 12:24 PM [phone]

## 2019-04-25 NOTE — NC FL2 (Signed)
[place] LEVEL OF CARE SCREENING TOOL     IDENTIFICATION  Patient Name: Colton Lamb Birthdate: Apr 26, 1951 Sex: male Admission Date (Current Location): 04/21/2019  [place] and [place] Number:  Engineering geologist and Address:  [facility], [address] [place], [place] [address]      Provider Number: [id number]  Attending Physician Name and Address:  Max Sane, MD  Relative Name and Phone Number:       Current Level of Care: Hospital Recommended Level of Care: [place] Prior Approval Number:    Date Approved/Denied:   PASRR Number: [id number]  Discharge Plan: SNF    Current Diagnoses: Patient Active Problem List   Diagnosis Date Noted  . Acute blood loss anemia   . Pressure injury of skin 03/17/2019  . Acute renal failure (ARF) ([place])   . Empyema lung ([place])   . Shortness of breath   . Pleural effusion on right   . Sepsis ([place]) 03/04/2019  . Restless legs syndrome (RLS) 06/16/2017  . Hypertension, essential 06/16/2017  . Coronary artery disease of native artery of native heart with stable angina pectoris ([place]) 04/27/2017  . Hydrocephalus ([place]) 09/08/2016  . Dizziness and giddiness   . Elevated troponin 09/06/2016  . Type II diabetes mellitus ([place])   . Ischemic cardiomyopathy   . Hyponatremia 07/01/2015  . Diabetes mellitus type 2, uncontrolled, with complications ([place]) 76/14/7092  . 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 OSA (obstructive sleep apnea) 12/20/2013  . Morbid obesity ([place]) 12/20/2013  . AKI (acute kidney injury) - Creatinine improved at d/c 12/14/2013  . Elevated TSH - will need f/u TFTs with PCP in 3-4 weeks 12/14/2013  . Junctional bradycardia - resolved 12/14/2013  . Syncope - due to bradycardia in setting of Digoxin Toxicity 12/11/2013  . Chronic systolic heart failure ([place]) 12/06/2013  . At risk for sudden cardiac death - on LifeVest 26-Nov-2013  .  Acute combined systolic and diastolic CHF, NYHA class 3 -- 2/2 MI 11-26-2013  . Cardiomyopathy, ischemic 11/26/13  . Atrial flutter ([place]) 11/22/2013  . NSTEMI (non-ST elevated myocardial infarction) ([place]) 11/22/2013  . Long term current use of anticoagulant 10/19/2010  . Hyperlipidemia 05/06/2010  . Edema 05/06/2010  . CAD S/P percutaneous coronary angioplasty - multiple PCIs 11/11/2008  . Atrial fibrillation ([place]) 11/11/2008  . GERD 11/11/2008  . [place] INTESTINE 11/11/2008  . COLONIC POLYPS, HX OF 11/11/2008  . SHELLFISH ALLERGY 11/11/2008    Orientation RESPIRATION BLADDER Height & Weight     Self, Time, Situation, Place  Normal Incontinent Weight: 265 lb 6.9 oz (120.4 kg) Height:  5' 11"  (180.3 cm)  BEHAVIORAL SYMPTOMS/MOOD NEUROLOGICAL BOWEL NUTRITION STATUS      Continent Diet(Diet: Heart Healthy/ Carb Modified.)  AMBULATORY STATUS COMMUNICATION OF NEEDS Skin   Extensive Assist Verbally PU Stage and Appropriate Care(Sacrum Stage 4 Pressure Injury)                       Personal Care Assistance Level of Assistance  Bathing, Feeding, Dressing Bathing Assistance: Limited assistance Feeding assistance: Independent Dressing Assistance: Limited assistance     Functional Limitations Info  Sight, Hearing, Speech Sight Info: Adequate Hearing Info: Adequate Speech Info: Adequate    SPECIAL CARE FACTORS FREQUENCY  PT (By licensed PT), OT (By licensed OT)     PT Frequency: 5 OT Frequency: 5            Contractures  Additional Factors Info  Code Status, Allergies Code Status Info: Full Code. Allergies Info: Iodine, Shrimp Shellfish Allergy, Tetracycline           Current Medications (04/25/2019):  This is the current hospital active medication list Current Facility-Administered Medications  Medication Dose Route Frequency Provider Last Rate Last Dose  . acetaminophen (TYLENOL) tablet 650 mg  650 mg Oral Q6H PRN Demetrios Loll, MD    650 mg at 04/25/19 0156   Or  . acetaminophen (TYLENOL) suppository 650 mg  650 mg Rectal Q6H PRN Demetrios Loll, MD      . albuterol (PROVENTIL) (2.5 MG/3ML) 0.083% nebulizer solution 2.5 mg  2.5 mg Nebulization Q2H PRN Demetrios Loll, MD      . aspirin chewable tablet 81 mg  81 mg Oral Daily Demetrios Loll, MD   81 mg at 04/24/19 0912  . atorvastatin (LIPITOR) tablet 80 mg  80 mg Oral Daily Ojie, Jude, MD   80 mg at 04/24/19 0912  . bisacodyl (DULCOLAX) EC tablet 5 mg  5 mg Oral Daily PRN Demetrios Loll, MD      . ceFEPIme (MAXIPIME) 2 g in sodium chloride 0.9 % 100 mL IVPB  2 g Intravenous Q8H Benita Gutter, RPH   Stopped at 04/25/19 0032  . Chlorhexidine Gluconate Cloth 2 % PADS 6 each  6 each Topical Daily Tyler Pita, MD   6 each at 04/24/19 1014  . collagenase (SANTYL) ointment   Topical Daily Manuella Ghazi, Vipul, MD      . feeding supplement (NEPRO CARB STEADY) liquid 237 mL  237 mL Oral TID BM Ottie Glazier, MD 0 mL/hr at 04/22/19 1955 237 mL at 04/24/19 1950  . finasteride (PROSCAR) tablet 5 mg  5 mg Oral Daily Demetrios Loll, MD   5 mg at 04/24/19 0912  . HYDROcodone-acetaminophen (NORCO/VICODIN) 5-325 MG per tablet 1 tablet  1 tablet Oral Q6H PRN Max Sane, MD      . influenza vaccine adjuvanted (FLUAD) injection 0.5 mL  0.5 mL Intramuscular Tomorrow-1000 [name], MD      . insulin aspart (novoLOG) injection 0-5 Units  0-5 Units Subcutaneous QHS Demetrios Loll, MD      . insulin aspart (novoLOG) injection 0-9 Units  0-9 Units Subcutaneous TID WC Demetrios Loll, MD   1 Units at 04/24/19 1741  . insulin aspart (novoLOG) injection 6 Units  6 Units Subcutaneous TID Otho Perl, MD   6 Units at 04/25/19 [phone]  . insulin detemir (LEVEMIR) injection 10 Units  10 Units Subcutaneous Daily Ottie Glazier, MD   10 Units at 04/24/19 0911  . lactobacillus (FLORANEX/LACTINEX) granules 1 g  1 g Oral TID WC Demetrios Loll, MD   1 g at 04/24/19 1656  . metroNIDAZOLE (FLAGYL) IVPB 500 mg  500 mg Intravenous Q8H Tukov-Yual,  Magdalene S, NP   Stopped at 04/25/19 0237  . morphine 2 MG/ML injection 1 mg  1 mg Intravenous Q4H PRN Max Sane, MD      . multivitamin-lutein (OCUVITE-LUTEIN) capsule 1 capsule  1 capsule Oral Daily Ottie Glazier, MD   1 capsule at 04/23/19 0932  . mupirocin cream (BACTROBAN) 2 %   Topical Daily Max Sane, MD      . niacin (NIASPAN) CR tablet 1,000 mg  1,000 mg Oral Daily Demetrios Loll, MD   1,000 mg at 04/24/19 0912  . nitroGLYCERIN (NITROSTAT) SL tablet 0.4 mg  0.4 mg Sublingual Q5 min PRN Demetrios Loll, MD      .  ondansetron (ZOFRAN) tablet 4 mg  4 mg Oral Q6H PRN Demetrios Loll, MD       Or  . ondansetron [facility]) injection 4 mg  4 mg Intravenous Q6H PRN Demetrios Loll, MD      . oxybutynin [facility]) tablet 5 mg  5 mg Oral TID Max Sane, MD   5 mg at 04/24/19 2102  . pantoprazole (PROTONIX) EC tablet 40 mg  40 mg Oral Daily Demetrios Loll, MD   40 mg at 04/24/19 0913  . pneumococcal 23 valent vaccine (PNU-IMMUNE) injection 0.5 mL  0.5 mL Intramuscular Tomorrow-1000 [name], MD      . senna-docusate (Senokot-S) tablet 1 tablet  1 tablet Oral QHS PRN Demetrios Loll, MD      . tamsulosin [facility]) capsule 0.4 mg  0.4 mg Oral QPC breakfast Demetrios Loll, MD   0.4 mg at 04/24/19 7793  . vitamin C (ASCORBIC ACID) tablet 500 mg  500 mg Oral BID Demetrios Loll, MD   500 mg at 04/24/19 2102     Discharge Medications: Please see discharge summary for a list of discharge medications.  Relevant Imaging Results:  Relevant Lab Results:   Additional Information SSN: [id number]  Bartolo Montanye, Veronia Beets, LCSW

## 2019-04-25 NOTE — Progress Notes (Addendum)
Daily Progress Note   Patient Name: Colton Lamb       Date: 04/25/2019 DOB: 01-22-1951  Age: 68 y.o. MRN#: [id number] Attending Physician: Max Sane, MD Primary Care Physician: Toni Arthurs, NP Admit Date: 04/21/2019  Reason for Consultation/Follow-up: Establishing goals of care  Subjective: Patient is resting in bed with sister in law at bedside. We discussed his status and decondintioning. We discussed needing to offload pressure from his sacral area for healing. He states he wants to get out of bed as this will make him feel better. Patient states he is very overwhelmed with everything, and right now just knows that he wants all care possible, including revising wishes yesterday for no ventilator. He  endorses today, that he would want a ventilator if needed.   Poor prognosis. WBC's 14.6 from 12.8 two days ago. Poor oral intake:  Albumin is 1.7. Creatinine is 1.78 from 1.53 yesterday.  Multiple wounds. Currently declining PT. Recommend palliative to follow outpatient with transition to hospice when patient is ready to transition. He wants all care possible at this time. He is hospice appropriate if he wishes.     Length of Stay: 4  Current Medications: Scheduled Meds:  . aspirin  81 mg Oral Daily  . atorvastatin  80 mg Oral Daily  . Chlorhexidine Gluconate Cloth  6 each Topical Daily  . collagenase   Topical Daily  . feeding supplement (NEPRO CARB STEADY)  237 mL Oral TID BM  . finasteride  5 mg Oral Daily  . influenza vaccine adjuvanted  0.5 mL Intramuscular Tomorrow-1000  . insulin aspart  0-5 Units Subcutaneous QHS  . insulin aspart  0-9 Units Subcutaneous TID WC  . insulin aspart  6 Units Subcutaneous TID AC  . insulin detemir  10 Units Subcutaneous Daily  . lactobacillus   1 g Oral TID WC  . multivitamin-lutein  1 capsule Oral Daily  . mupirocin cream   Topical Daily  . niacin  1,000 mg Oral Daily  . oxybutynin  5 mg Oral TID  . pantoprazole  40 mg Oral Daily  . pneumococcal 23 valent vaccine  0.5 mL Intramuscular Tomorrow-1000  . tamsulosin  0.4 mg Oral QPC breakfast  . vitamin C  500 mg Oral BID    Continuous Infusions: . ceFEPime (MAXIPIME) IV 2 g (04/25/19  1039)  . metronidazole Stopped (04/25/19 0237)    PRN Meds: acetaminophen **OR** acetaminophen, albuterol, bisacodyl, HYDROcodone-acetaminophen, morphine injection, nitroGLYCERIN, ondansetron **OR** ondansetron (ZOFRAN) IV, senna-docusate  Physical Exam Pulmonary:     Effort: Pulmonary effort is normal.  Neurological:     Mental Status: He is alert.             Vital Signs: BP (!) 99/58 (BP Location: Left Arm)   Pulse 82   Temp 97.6 F (36.4 C) (Oral)   Resp 20   Ht 5' 11"  (1.803 m)   Wt 120.4 kg   SpO2 100%   BMI 37.02 kg/m  SpO2: SpO2: 100 % O2 Device: O2 Device: Room Air O2 Flow Rate:    Intake/output summary:   Intake/Output Summary (Last 24 hours) at 04/25/2019 1101 Last data filed at 04/25/2019 0900 Gross per 24 hour  Intake 1053.83 ml  Output 525 ml  Net 528.83 ml   LBM: Last BM Date: 04/24/19 Baseline Weight: Weight: 122.5 kg Most recent weight: Weight: 120.4 kg       Palliative Assessment/Data:  30%      Patient Active Problem List   Diagnosis Date Noted  . Acute blood loss anemia   . Pressure injury of skin 03/17/2019  . Acute renal failure (ARF) ([place])   . Empyema lung ([place])   . Shortness of breath   . Pleural effusion on right   . Sepsis ([place]) 03/04/2019  . Restless legs syndrome (RLS) 06/16/2017  . Hypertension, essential 06/16/2017  . Coronary artery disease of native artery of native heart with stable angina pectoris ([place]) 04/27/2017  . Hydrocephalus ([place]) 09/08/2016  . Dizziness and giddiness   . Elevated troponin 09/06/2016  . Type II  diabetes mellitus ([place])   . Ischemic cardiomyopathy   . Hyponatremia 07/01/2015  . Diabetes mellitus type 2, uncontrolled, with complications ([place]) 16/05/9603  . 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 OSA (obstructive sleep apnea) 12/20/2013  . Morbid obesity ([place]) 12/20/2013  . AKI (acute kidney injury) - Creatinine improved at d/c 12/14/2013  . Elevated TSH - will need f/u TFTs with PCP in 3-4 weeks 12/14/2013  . Junctional bradycardia - resolved 12/14/2013  . Syncope - due to bradycardia in setting of Digoxin Toxicity 12/11/2013  . Chronic systolic heart failure ([place]) 12/06/2013  . At risk for sudden cardiac death - on LifeVest 11-26-2013  . Acute combined systolic and diastolic CHF, NYHA class 3 -- 2/2 MI Nov 26, 2013  . Cardiomyopathy, ischemic 26-Nov-2013  . Atrial flutter ([place]) 11/22/2013  . NSTEMI (non-ST elevated myocardial infarction) ([place]) 11/22/2013  . Long term current use of anticoagulant 10/19/2010  . Hyperlipidemia 05/06/2010  . Edema 05/06/2010  . CAD S/P percutaneous coronary angioplasty - multiple PCIs 11/11/2008  . Atrial fibrillation ([place]) 11/11/2008  . GERD 11/11/2008  . [place] INTESTINE 11/11/2008  . COLONIC POLYPS, HX OF 11/11/2008  . [place] ALLERGY 11/11/2008    Palliative Care Assessment & Plan    Recommendations/Plan:  Palliative to follow outpatient.   Full code/full scope.     Code Status:    Code Status Orders  (From admission, onward)         Start     Ordered   04/21/19 1858  Full code  Continuous     04/21/19 1857        Code Status History    Date Active Date Inactive Code Status Order ID Comments User Context   03/04/2019 2021 03/19/2019 2042  Full Code [id number]  [name], MD Inpatient   09/07/2016 0100 09/09/2016 2001 Full Code [id number]  Harvie Bridge, DO Inpatient   04/16/2014 1552 04/17/2014 1512 Full Code [id number]  Evans Lance, MD Inpatient   12/11/2013 2042  12/14/2013 1516 Full Code [id number]  Crista Luria Inpatient   11/22/2013 1434 11/30/2013 2000 Full Code [id number]  Belva Crome, MD Inpatient   11/22/2013 0916 11/22/2013 1434 Full Code [id number]  Dorothy Spark, MD Inpatient   11/22/2013 0829 11/22/2013 0916 Full Code [id number]  Barrett, Evelene Croon, PA-C Inpatient   Advance Care Planning Activity    Advance Directive Documentation     Most Recent Value  Type of Advance Directive  Healthcare Power of Attorney  Pre-existing out of facility DNR order (yellow form or pink MOST form)  -  "MOST" Form in Place?  -       Prognosis:   < 6 months Multiple wounds. Sacral stage 4 wound. Not off loading pressure from his sacral area to help healing. Declining PT. Albumin 1.7.   Discharge Planning:  D/C with palliative.    Thank you for allowing the Palliative Medicine Team to assist in the care of this patient.   Total Time 35 min Prolonged Time Billed no      Greater than 50%  of this time was spent counseling and coordinating care related to the above assessment and plan.  Asencion Gowda, NP  Please contact Palliative Medicine Team phone at [phone] for questions and concerns.

## 2019-04-25 NOTE — TOC Progression Note (Addendum)
Transition of Care [facility]) - Progression Note    Patient Details  Name: Colton Lamb MRN: [id number] Date of Birth: 1951-01-16  Transition of Care [facility]) CM/SW Contact  Raiya Stainback, Lenice Llamas Phone Number: [phone]  04/25/2019, 12:19 PM  Clinical Narrative: Clinical Social Worker (CSW) met with patient and made him aware that [facility] and [facility] made bed offers. Patient's sister in law Patric Dykes was at bedside. Patient reported that he doesn't want to go to [facility] so he will accept the bed offer at [facility]. Patient is agreeable to D/C back to [facility]. Palliative NP is recommending outpatient palliative. [place] liaison is aware of above. Patient's last negative covid test was on 9/20, patient will likely need another negative covid test. MD and RN aware of above. [facility] liaison is aware of above and will get patient a low air loss mattress. CSW will continue to follow and assist as needed.   2:17 pm: CSW contacted [employer] liaison and asked her to review chart.     Expected Discharge Plan: Skilled Nursing Facility Barriers to Discharge: Continued Medical Work up  Expected Discharge Plan and Services Expected Discharge Plan: [place] Choice: [place] arrangements for the past 2 months: [place], Single Family Home                                       Social Determinants of Health (SDOH) Interventions    Readmission Risk Interventions Readmission Risk Prevention Plan 04/24/2019 03/11/2019  Transportation Screening Complete Complete  Social Work Consult for [place] Planning/Counseling - Complete  Palliative Care Screening - Not Applicable  Medication Review Press photographer) Complete Complete  PCP or Specialist appointment within 3-5 days of discharge Complete -  [place] or [place] (No Data) -  SW Recovery Care/Counseling Consult Complete -   Palliative Care Screening Complete -  Skilled Nursing Facility Complete -  Some recent data might be hidden

## 2019-04-25 NOTE — Care Management Important Message (Signed)
Important Message  Patient Details  Name: Colton Lamb MRN: [id number] Date of Birth: Jan 25, 1951   Medicare Important Message Given:  Yes     Juliann Pulse A Jayle Solarz 04/25/2019, 11:01 AM

## 2019-04-25 NOTE — Progress Notes (Signed)
[place] [place] NAME: Colton Lamb    MR#:  [id number]  DATE OF BIRTH:  1951-05-29  SUBJECTIVE:  Came in with bleeding of his chronic decubitus with foul smell feels tired, remains hypotensive.  REVIEW OF SYSTEMS:   Review of Systems  Constitutional: Positive for malaise/fatigue. Negative for diaphoresis, fever and weight loss.  HENT: Negative for ear discharge, ear pain, hearing loss, nosebleeds, sore throat and tinnitus.   Eyes: Negative for blurred vision and pain.  Respiratory: Negative for cough, hemoptysis, shortness of breath and wheezing.   Cardiovascular: Negative for chest pain, palpitations, orthopnea and leg swelling.  Gastrointestinal: Negative for abdominal pain, blood in stool, constipation, diarrhea, heartburn, nausea and vomiting.  Genitourinary: Negative for dysuria, frequency and urgency.  Musculoskeletal: Negative for back pain and myalgias.  Skin: Negative for itching and rash.  Neurological: Negative for dizziness, tingling, tremors, focal weakness, seizures, weakness and headaches.  Psychiatric/Behavioral: Negative for depression. The patient is not nervous/anxious.    Tolerating Diet:yes Tolerating PT:   DRUG ALLERGIES:   Allergies  Allergen Reactions  . Iodine Other (See Comments)    Shortness of breath, swelling and hives  . Shrimp [Shellfish Allergy] Other (See Comments)    SWELLING    HIVES    SHORTNESS OF BREATH  . Tetracycline Rash    VITALS:  Blood pressure 96/83, pulse 65, temperature 97.6 F (36.4 C), temperature source Oral, resp. rate 20, height 5' 11"  (1.803 m), weight 120.4 kg, SpO2 100 %.  PHYSICAL EXAMINATION:   Physical Exam  GENERAL:  68 y.o.-year-old patient lying in the bed with no acute distress. crtically ill EYES: Pupils equal, round, reactive to light and accommodation. No scleral icterus. Extraocular muscles intact.  HEENT: Head atraumatic, normocephalic. Oropharynx  and nasopharynx clear.  NECK:  Supple, no jugular venous distention. No thyroid enlargement, no tenderness.  LUNGS: Normal breath sounds bilaterally, no wheezing, rales, rhonchi. No use of accessory muscles of respiration.  CARDIOVASCULAR: S1, S2 normal. No murmurs, rubs, or gallops.  ABDOMEN: Soft, nontender, nondistended. Bowel sounds present. No organomegaly or mass.  EXTREMITIES: No cyanosis, clubbing or edema b/l.    NEUROLOGIC: grossly non focal PSYCHIATRIC:  patient issleepy  SKIN:      LABORATORY PANEL:  CBC Recent Labs  Lab 04/25/19 0540  WBC 14.6*  HGB 8.4*  HCT 27.5*  PLT 293    Chemistries  Recent Labs  Lab 04/21/19 2217  04/23/19 0139  04/25/19 0540  NA 138   < > 137   < > 137  K 5.5*   < > 4.7   < > 4.5  CL 108   < > 108   < > 111  CO2 22   < > 22   < > 19*  GLUCOSE 132*   < > 130*   < > 123*  BUN 26*   < > 25*   < > 34*  CREATININE 1.06   < > 1.09   < > 1.78*  CALCIUM 7.7*   < > 7.8*   < > 7.9*  MG 1.9  --  2.0  --   --   AST 33  --   --   --   --   ALT 14  --   --   --   --   ALKPHOS 174*  --   --   --   --   BILITOT 0.8  --   --   --   --    < > =  values in this interval not displayed.   Cardiac Enzymes No results for input(s): TROPONINI in the last 168 hours. RADIOLOGY:  No results found. ASSESSMENT AND PLAN:  68 year old male with a medical history chronic systolic congestive heart failure with EF of 20 to 25%, CAD, atrial fibrillation/flutter on currently on Xarelto for  complex medical history, currently a SNF resident, who presented to the ED for evaluation of bleeding from sacral ulcer.  Patient was recently hospitalized with severe pneumonia and discharged to SNF  1. Septic shock secondary to pneumonia and  infected decubitus ulcer with significant amount of bleeding causing acute blood loss anemia -continue IV  cefepime and metronidazole -surgical consultation was obtained by Dr. Windell Moment and bedside debridement was done. Patient has  extensive wound up to the bone.   -Aggressive wound dressing-- Wound care nurse is following -follow blood culture and trend antibiotics accordingly  2. History of a flutter/fib on Xarelto -holding Xarelto given significant bleeding from decubitus -Holding metoprolol due to hypotension  3.Hyperkalemia--- Came in K of 5.5--4.5  4.Acute blood loss anemia due to significant bleeding from the decubitus -hemoglobin is 8.4 -hgb 6.7---6.6--3 units BT -pt's INR is elevated due to xarelto  5.lactic acidosis due to infection -improving  6.  Acute renal failure -Monitor this could be ATN due to significant hypotension -Avoid nephrotoxic medication, Nephrology consultation -Creatinine trending up from 1.0->1.5 and today is 1.78   Palliative care following.  Patient would like aggressive care and full code for now although he would qualify for hospice or palliative care evaluation if he chooses to do so  -Patient prefers to go different facility.  social worker aware and working on it  -    CODE STATUS: FULL code  DVT Prophylaxis: SCD  TOTAL TIME TAKING CARE OF THIS PATIENT: *30* minutes.  >50% time spent on counselling and coordination of care  POSSIBLE D/C IN 1-2 DAYS, DEPENDING ON CLINICAL CONDITION.  Note: This dictation was prepared with Dragon dictation along with smaller phrase technology. Any transcriptional errors that result from this process are unintentional.  Max Sane M.D on 04/25/2019 at 12:33 PM  Between 7am to 6pm - Pager - [phone]  After 6pm go to [email or web address] - password [email or web address] [facility]  Office  [phone]  CC: Primary care physician; Toni Arthurs, NPPatient ID: Colton Lamb, male   DOB: [birth year], 68 y.o.   MRN: [id number]

## 2019-04-25 NOTE — TOC Progression Note (Signed)
Transition of Care [facility]) - Progression Note    Patient Details  Name: Colton Lamb MRN: [id number] Date of Birth: 04-May-1951  Transition of Care [facility]) CM/SW Contact  Jamielyn Petrucci, Lenice Llamas Phone Number: [phone]  04/25/2019, 4:05 PM  Clinical Narrative: Clinical Social Worker (CSW) met with patient and discussed LTAC options. [facility].   Per MD patient will not require IV ABX post D/C. Per [place] liaison patient will not meet criteria for LTAC if he does not need IV ABX. CSW met with patient and made him aware of above. Patient is agreeable to return to [facility]. RN will complete covid test today. [facility] liaison is aware of above. CSW will continue to follow and assist as needed.     Expected Discharge Plan: Skilled Nursing Facility Barriers to Discharge: Continued Medical Work up  Expected Discharge Plan and Services Expected Discharge Plan: [employer] Choice: [place] arrangements for the past 2 months: [place], Single Family Home                                       Social Determinants of Health (SDOH) Interventions    Readmission Risk Interventions Readmission Risk Prevention Plan 04/24/2019 03/11/2019  Transportation Screening Complete Complete  Social Work Consult for [place] Planning/Counseling - Complete  Palliative Care Screening - Not Applicable  Medication Review Press photographer) Complete Complete  PCP or Specialist appointment within 3-5 days of discharge Complete -  [place] or [place] (No Data) -  SW Recovery Care/Counseling Consult Complete -  Palliative Care Screening Complete -  Skilled Nursing Facility Complete -  Some recent data might be hidden

## 2019-04-25 NOTE — Progress Notes (Signed)
New referral for Palliative to follow post discharge received from [facility]. Plan is for SNF, however facility has not been chosen. Patient information given to referral. Flo Shanks BSN, RN, [place] [phone]

## 2019-04-26 LAB — GLUCOSE, CAPILLARY
Glucose-Capillary: 145 mg/dL — ABNORMAL HIGH (ref 70–99)
Glucose-Capillary: 127 mg/dL — ABNORMAL HIGH (ref 70–99)

## 2019-04-26 LAB — CULTURE, BLOOD (ROUTINE X 2)
Culture: NO GROWTH
Culture: NO GROWTH
Special Requests: ADEQUATE
Special Requests: ADEQUATE

## 2019-04-26 LAB — BASIC METABOLIC PANEL
Anion gap: 9 (ref 5–15)
BUN: 38 mg/dL — ABNORMAL HIGH (ref 8–23)
CO2: 20 mmol/L — ABNORMAL LOW (ref 22–32)
Calcium: 8 mg/dL — ABNORMAL LOW (ref 8.9–10.3)
Chloride: 111 mmol/L (ref 98–111)
Creatinine, Ser: 1.71 mg/dL — ABNORMAL HIGH (ref 0.61–1.24)
GFR calc Af Amer: 47 mL/min — ABNORMAL LOW (ref >60)
GFR calc non Af Amer: 40 mL/min — ABNORMAL LOW (ref >60)
Glucose, Bld: 162 mg/dL — ABNORMAL HIGH (ref 70–99)
Potassium: 4.6 mmol/L (ref 3.5–5.1)
Sodium: 140 mmol/L (ref 135–145)

## 2019-04-26 LAB — CBC
HCT: 27.5 % — ABNORMAL LOW (ref 39.0–52.0)
Hemoglobin: 8.3 g/dL — ABNORMAL LOW (ref 13.0–17.0)
MCH: 27 pg (ref 26.0–34.0)
MCHC: 30.2 g/dL (ref 30.0–36.0)
MCV: 89.6 fL (ref 80.0–100.0)
Platelets: 283 10*3/uL (ref 150–400)
RBC: 3.07 MIL/uL — ABNORMAL LOW (ref 4.22–5.81)
RDW: 20.5 % — ABNORMAL HIGH (ref 11.5–15.5)
WBC: 13.2 10*3/uL — ABNORMAL HIGH (ref 4.0–10.5)
nRBC: 0 % (ref 0.0–0.2)

## 2019-04-26 LAB — SARS CORONAVIRUS 2 (TAT 6-24 HRS): SARS Coronavirus 2: NEGATIVE

## 2019-04-26 MED ORDER — MIDODRINE HCL 10 MG PO TABS: 10.0000 mg | ORAL_TABLET | Freq: Three times a day (TID) | ORAL | 0 refills | Status: AC

## 2019-04-26 MED ORDER — SODIUM CHLORIDE 0.9 % IV SOLN
2.0000 g | Freq: Two times a day (BID) | INTRAVENOUS | Status: DC
  Administered 2019-04-26: 2 g via INTRAVENOUS
  Filled 2019-04-26 (×3): qty 2

## 2019-04-26 MED ORDER — MIDODRINE HCL 5 MG PO TABS
10.0000 mg | ORAL_TABLET | Freq: Three times a day (TID) | ORAL | Status: DC
  Administered 2019-04-26 (×2): 10 mg via ORAL
  Filled 2019-04-26 (×2): qty 2

## 2019-04-26 MED ORDER — CEPHALEXIN 250 MG PO CAPS: 250.0000 mg | ORAL_CAPSULE | Freq: Two times a day (BID) | ORAL | 0 refills | Status: AC

## 2019-04-26 NOTE — Discharge Summary (Signed)
[facility] NAME: Colton Lamb    MR#:  [id number]  DATE OF BIRTH:  1951/06/26  DATE OF ADMISSION:  04/21/2019   ADMITTING PHYSICIAN: Jude Ojie, MD  DATE OF DISCHARGE: 04/26/2019  PRIMARY CARE PHYSICIAN: Toni Arthurs, NP   ADMISSION DIAGNOSIS:  Pressure injury of sacral region, unstageable ([place]) [L89.150] Sepsis, due to unspecified organism, unspecified whether acute organ dysfunction present ([place]) [A41.9] Sepsis ([place]) [A41.9] DISCHARGE DIAGNOSIS:  Active Problems:   Sepsis ([place])   Acute blood loss anemia  SECONDARY DIAGNOSIS:   Past Medical History:  Diagnosis Date  . Atrial flutter ([place])    a. s/p Cardioversion 11/22/13, on amiodarone and Xarelto.  . Chronic systolic heart failure ([place])    a. 10/2013 EF 20-25%, grade III DD, RV mildly dilated and sys fx mild/mod reduced;  b. 01/2014 Echo: EF 30-35%, gr3 DD, mod dil LA.  Marland Kitchen Coronary artery disease    a. s/p MI 2007/2015;  b. s/p prior PCI to the LAD/LCX/PDA/PL;  c. 2008: s/p Cypher DES to the OM.  Marland Kitchen Crohn's ileocolitis ([place])   . GERD (gastroesophageal reflux disease)   . Hx of adenomatous colonic polyps 11/2003  . Hyperlipidemia   . Hypertension   . Ischemic cardiomyopathy    s. 01/2014 s/p MDT [id number] [name] XT DR single lead AICD.  Marland Kitchen Obesity   . Paroxysmal atrial fibrillation (HCC)    a. CHA2DS2VASc = 4-->xarelto/amio.  . Sleep apnea   . Syncope    a.  11/2013 in setting of volume depletion and bradycardia due to dig toxicity   . Type II diabetes mellitus [facility])    HOSPITAL COURSE:  68 year old male with a medical history chronic systolic congestive heart failure with EF of 20 to 25%, CAD, atrial fibrillation/flutter on currently on Xarelto for  complex medical history, currently a SNF resident, admitted for evaluation of bleeding from sacral ulcer.   1. Septic shock: present on admission secondary to pneumonia and  infected decubitus ulcer with significant amount  of bleeding causing acute blood loss anemia - Treated with IV cefepime and metronidazole while in the Hospital -surgical consultation was obtained by Dr. Windell Moment and bedside debridement was done. Patient has extensive wound up to the bone but not much infection per surgery.    * Sacrum stage 4 pressure injury - bedside debridement with removal of nonviable tissue by surgery  - Santyl be ordered for daily topical treatment and patient should be followed as an outpatient at the wound care center; this is requested by physician referral  - perform dressing changes daily with Santyl and moist gauze packing to assist with removal of nonviable tissue.  - consider low airloss mattress to reduce pressure to sacrum   * Bilat plantar feet - several areas of dry, darker-colored calluses which remove easily when scrubbed with moist gauze.  - Left heel with darker-colored dry callous, this is not a pressure injury, 2X2cm area removes easily revealing pink dry skin without open wound or drainage. - Left plantar foot with 3X3cm darker-colored dry callous, this is not a pressure injury, 2X2cm area removes easily revealing pink dry skin without open wound or drainage. - Right plantar foot with black hard dry callous, tightly adhered but lifts at the edges and removes, revealing pink moist partial thickness skin loss. No odor or drainage, 1X1X.1cm.  This is not a pressure injury - Dressing procedure/placement/frequency: Continue plan of care as previously ordered with Bactroban to  promote moist healing and foam dressing to protect from further injury.  Float heels to reduce pressure.  2. History of a flutter/fib on Xarelto -Resume Xarelto at D/C. Consider holding if any bleeding -Holding metoprolol due to hypotension, can resume if HR up as BP tolerates  3.Hyperkalemia--- resolved  4.Acute blood loss anemia due to significant bleeding from the decubitus -hemoglobin is 8.3 - s/p 3 units Blood  transfusion  5.lactic acidosis due to infection - resolved  6.  Acute renal failure - possibly could be ATN. -Creatinine 1.7 at dc  7. Urinary retention - he came in with indwelling Foley and I offered and insisted to remove it before D/C but he is refusing and would like to keep it indwelling  8. Hypotension: hold all BP meds and try midodrine. Avoid narcotics DISCHARGE CONDITIONS:  fair CONSULTS OBTAINED:  Treatment Team:  Herbert Pun, MD Alla Feeling, Palliative [facility], [employer], MD DRUG ALLERGIES:   Allergies  Allergen Reactions  . Iodine Other (See Comments)    Shortness of breath, swelling and hives  . Shrimp [Shellfish Allergy] Other (See Comments)    SWELLING    HIVES    SHORTNESS OF BREATH  . Tetracycline Rash   DISCHARGE MEDICATIONS:   Allergies as of 04/26/2019      Reactions   Iodine Other (See Comments)   Shortness of breath, swelling and hives   Shrimp [shellfish Allergy] Other (See Comments)   SWELLING    HIVES    SHORTNESS OF BREATH   Tetracycline Rash      Medication List    STOP taking these medications   amiodarone 200 MG tablet Commonly known as: PACERONE   metoprolol succinate 25 MG 24 hr tablet Commonly known as: TOPROL-XL   nitroGLYCERIN 0.4 MG SL tablet Commonly known as: NITROSTAT     TAKE these medications   acetaminophen 325 MG tablet Commonly known as: TYLENOL Take 1-2 tablets (325-650 mg total) by mouth every 4 (four) hours as needed for mild pain.   Albuterol Sulfate 108 (90 Base) MCG/ACT Aepb Inhale 1 puff into the lungs every 6 (six) hours as needed (shortness of breath).   aspirin 81 MG tablet Take 81 mg by mouth daily.   atorvastatin 80 MG tablet Commonly known as: LIPITOR TAKE 1 TABLET BY MOUTH EVERY DAY   balsalazide 750 MG capsule Commonly known as: COLAZAL TAKE 1 CAPSULE (750 MG TOTAL) BY MOUTH 3 (THREE) TIMES DAILY. What changed:   how much to take  how to take this  when to take this   additional instructions   cephALEXin 250 MG capsule Commonly known as: KEFLEX Take 1 capsule (250 mg total) by mouth 2 (two) times daily for 5 days.   feeding supplement (NEPRO CARB STEADY) Liqd Take 237 mLs by mouth 2 (two) times daily between meals.   finasteride 5 MG tablet Commonly known as: PROSCAR Take 1 tablet (5 mg total) by mouth daily.   insulin aspart 100 UNIT/ML injection Commonly known as: novoLOG Inject 5 Units into the skin 3 (three) times daily with meals. What changed:   how much to take  when to take this   insulin detemir 100 UNIT/ML injection Commonly known as: LEVEMIR Inject 0.13 mLs (13 Units total) into the skin daily. What changed: how much to take   lactobacillus Pack Take 1 packet (1 g total) by mouth 3 (three) times daily with meals.   midodrine 10 MG tablet Commonly known as: PROAMATINE Take 1 tablet (10 mg total)  by mouth 3 (three) times daily.   multivitamin with minerals tablet Take 1 tablet by mouth daily.   niacin 1000 MG CR tablet Commonly known as: NIASPAN Take 1,000 mg by mouth daily.   omeprazole 20 MG capsule Commonly known as: PRILOSEC Take 20 mg by mouth daily.   potassium chloride SA 20 MEQ tablet Commonly known as: K-DUR Take 1 tablet (20 mEq total) by mouth daily.   sodium hypochlorite external solution Commonly known as: DAKIN'S 1/2 STRENGTH Irrigate with 1 application as directed 2 (two) times daily. (apply to sacral wound)   tamsulosin 0.4 MG Caps capsule Commonly known as: FLOMAX Take 1 capsule (0.4 mg total) by mouth daily after breakfast.   vitamin C 500 MG tablet Commonly known as: ASCORBIC ACID Take 500 mg by mouth 2 (two) times daily.   Xarelto 20 MG Tabs tablet Generic drug: rivaroxaban TAKE 1 TABLET BY MOUTH EVERY DAY WITH LUNCH What changed: See the new instructions.      DISCHARGE INSTRUCTIONS:   DIET:  Renal diet DISCHARGE CONDITION:  Fair ACTIVITY:  Activity as tolerated OXYGEN:   Home Oxygen: No.  Oxygen Delivery: room air DISCHARGE LOCATION:  Nursing home with Palliative care. Consider transition to Hospice if patient is agreeable. He does qualify for Hospice.   If you experience worsening of your admission symptoms, develop shortness of breath, life threatening emergency, suicidal or homicidal thoughts you must seek medical attention immediately by calling 911 or calling your MD immediately  if symptoms less severe.  You Must read complete instructions/literature along with all the possible adverse reactions/side effects for all the Medicines you take and that have been prescribed to you. Take any new Medicines after you have completely understood and accpet all the possible adverse reactions/side effects.   Please note  You were cared for by a hospitalist during your hospital stay. If you have any questions about your discharge medications or the care you received while you were in the hospital after you are discharged, you can call the unit and asked to speak with the hospitalist on call if the hospitalist that took care of you is not available. Once you are discharged, your primary care physician will handle any further medical issues. Please note that NO REFILLS for any discharge medications will be authorized once you are discharged, as it is imperative that you return to your primary care physician (or establish a relationship with a primary care physician if you do not have one) for your aftercare needs so that they can reassess your need for medications and monitor your lab values.    On the day of Discharge:  VITAL SIGNS:  Blood pressure (!) 92/59, pulse 86, temperature 97.6 F (36.4 C), temperature source Oral, resp. rate 17, height 5' 11"  (1.803 m), weight 120.4 kg, SpO2 99 %. PHYSICAL EXAMINATION:  GENERAL:  68 y.o.-year-old patient lying in the bed with no acute distress. edematous EYES: Pupils equal, round, reactive to light and accommodation. No scleral  icterus. Extraocular muscles intact.  HEENT: Head atraumatic, normocephalic. Oropharynx and nasopharynx clear.  NECK:  Supple, no jugular venous distention. No thyroid enlargement, no tenderness.  LUNGS: Normal breath sounds bilaterally, no wheezing, rales,rhonchi or crepitation. No use of accessory muscles of respiration.  CARDIOVASCULAR: S1, S2 normal. No murmurs, rubs, or gallops.  ABDOMEN: Soft, non-tender, non-distended. Bowel sounds present. No organomegaly or mass.  EXTREMITIES: No pedal edema, cyanosis, or clubbing.  NEUROLOGIC: Cranial nerves II through XII are intact. Muscle strength 5/5 in  all extremities. Sensation intact. Gait not checked.  PSYCHIATRIC: The patient is alert and oriented x 3.  SKIN: SKIN:     DATA REVIEW:   CBC Recent Labs  Lab 04/26/19 0650  WBC 13.2*  HGB 8.3*  HCT 27.5*  PLT 283    Chemistries  Recent Labs  Lab 04/21/19 2217  04/23/19 0139  04/26/19 0650  NA 138   < > 137   < > 140  K 5.5*   < > 4.7   < > 4.6  CL 108   < > 108   < > 111  CO2 22   < > 22   < > 20*  GLUCOSE 132*   < > 130*   < > 162*  BUN 26*   < > 25*   < > 38*  CREATININE 1.06   < > 1.09   < > 1.71*  CALCIUM 7.7*   < > 7.8*   < > 8.0*  MG 1.9  --  2.0  --   --   AST 33  --   --   --   --   ALT 14  --   --   --   --   ALKPHOS 174*  --   --   --   --   BILITOT 0.8  --   --   --   --    < > = values in this interval not displayed.      Contact information for follow-up providers    Toni Arthurs, NP. Schedule an appointment as soon as possible for a visit in 5 days.   Specialty: Family Medicine Contact information: [place] [address] [phone]        Schedule an appointment as soon as possible for a visit with Minna Merritts, MD.   Specialty: Cardiology Contact information: [place] [address] [phone]        [place]. Schedule an appointment as soon  as possible for a visit in 1 week.   Specialty: Wound Care Contact information: [address] [id number] [place] [phone]       Herbert Pun, MD. Schedule an appointment as soon as possible for a visit in 2 week(s).   Specialty: General Surgery Contact information: [place] [address] [phone]            Contact information for after-discharge care    Destination    [facility] Preferred SNF.   Service: Skilled Nursing Contact information: [address] [place] [phone]                  Management plans discussed with the patient, nursing and they are in agreement.  CODE STATUS: Full Code   TOTAL TIME TAKING CARE OF THIS PATIENT: 45 minutes.    Max Sane M.D on 04/26/2019 at 11:28 AM  Between 7am to 6pm - Pager - [phone]  After 6pm go to [email or web address] - Proofreader  [facility]  Office  [phone]  CC: Primary care physician; Toni Arthurs, NP   Note: This dictation was prepared with Dragon dictation along with smaller phrase technology. Any transcriptional errors that result from this process are unintentional.

## 2019-04-26 NOTE — TOC Transition Note (Signed)
Transition of Care [facility]) - CM/SW Discharge Note   Patient Details  Name: Colton Lamb MRN: [id number] Date of Birth: 28-Nov-1950  [facility]) CM/SW Contact:  Candie Chroman, LCSW Phone Number: 04/26/2019, 11:54 AM   Clinical Narrative:  Patient has orders to discharge to [facility] today. Patient will call his sister-in-law to let her know. RN will call report to [phone] (Room 602) prior to setting up EMS transport. No further concerns. CSW signing off.   Final next level of care: Skilled Nursing Facility Barriers to Discharge: Barriers Resolved   Patient Goals and CMS Choice   CMS Medicare.gov Compare Post Acute Care list provided to:: Patient Choice offered to / list presented to : Patient  Discharge Placement              Patient chooses bed at: [facility] [place] Patient to be transferred to facility by: EMS Name of family member notified: Patient will call his sister-in-law. Patient and family notified of of transfer: 04/26/19  Discharge Plan and Services     Post Acute Care Choice: [facility]                               Social Determinants of Health (SDOH) Interventions     Readmission Risk Interventions Readmission Risk Prevention Plan 04/24/2019 03/11/2019  Transportation Screening Complete Complete  Social Work Consult for [place] Planning/Counseling - Complete  Palliative Care Screening - Not Applicable  Medication Review Press photographer) Complete Complete  PCP or Specialist appointment within 3-5 days of discharge Complete -  [place] or [place] (No Data) -  SW Recovery Care/Counseling Consult Complete -  Palliative Care Screening Complete -  Skilled Nursing Facility Complete -  Some recent data might be hidden

## 2019-04-26 NOTE — Discharge Instructions (Signed)
Preventing Pressure Injuries  A pressure injury, sometimes called a bedsore or a pressure ulcer, is an injury to the skin and underlying tissue caused by pressure. A pressure injury can happen when your skin presses against a surface, such as a mattress or wheelchair seat, for too long. The pressure on the blood vessels causes reduced blood flow to your skin. This can eventually cause the skin tissue to die and break down into a wound. Pressure injuries usually develop:  Over bony parts of the body, such as the tailbone, shoulders, elbows, hips, and heels.  Under medical devices, such as respiratory equipment, stockings, tubes, and splints. How can this condition affect me? Pressure injuries are caused by a lack of blood supply to an area of skin. These injuries begin as a reddened area on the skin and can become an open sore. They can result from intense pressure over a short period of time or from less pressure over a long period of time. Pressure injuries can vary in severity. They can cause pain, muscle damage, and infection. What can increase my risk? This condition is more likely to develop in people who:  Are in the hospital or an extended care facility.  Are bedridden or in a wheelchair.  Have an injury or disease that keeps them from: ? Moving normally. ? Feeling pain or pressure. ? Communicating if they feel pain or pressure.  Have a condition that: ? Makes them sleepy or less alert. ? Causes poor blood flow.  Need to wear a medical device.  Have poor control of their bladder or bowel functions (incontinence).  Have poor nutrition (malnutrition).  Have had this condition before.  Are of certain ethnicities. People of African American, Latino, or Hispanic descent are at higher risk compared to other ethnic groups. What actions can I take to prevent pressure injuries? Reducing and redistributing pressure  Do not lie or sit in one position for a long time. Move or change  position: ? Every hour when out of bed in a chair. ? Every two hours when in bed. ? As often as told by your health care provider.  Use pillows, wedges, or cushions to redistribute pressure. Ask your health care provider to recommend a mattress, cushions, or pads for you.  Use medical devices that do not rub your skin. Tell your health care provider if one of your medical devices is causing pain or irritation. Skin care If you are in the hospital, your health care providers:  Will inspect your skin, including areas under or around medical devices, at least twice a day.  May recommend that you use certain types of bedding to help prevent pressure injuries. These may include a pad, mattress, or chair cushion that is filled with gel, air, water, or foam.  Will evaluate your nutrition and consult a dietitian if needed.  Will inspect and change any wound dressings regularly.  May help you move into different positions every few hours.  Will adjust any medical devices and braces as needed to limit pressure on your skin.  Will keep your skin clean and dry.  May use gentle cleansers and skin protectants if you are incontinent.  Will moisturize any dry skin. In general, at home:  Keep your skin clean and dry. Gently pat your skin dry.  Do not rub or massage bony areas of your skin.  Moisturize dry skin.  Use gentle cleansers and skin protectants routinely if you are incontinent.  Check your skin at least once  a day for any changes in color and for any new blisters or sores. Make sure to check under and around any medical devices and between skin folds. Have a caregiver do this for you if you are not able.  Lifestyle  Be as active as you can every day. Ask your health care provider to suggest safe exercises or activities.  Do not abuse drugs or alcohol.  Do not use any products that contain nicotine or tobacco, such as cigarettes, e-cigarettes, and chewing tobacco. If you need  help quitting, ask your health care provider. General instructions   Take over-the-counter and prescription medicines only as told by your health care provider.  Work with your health care provider to manage any chronic health conditions.  Eat a healthy diet that includes protein, vitamins, and minerals. Ask your health care provider what types of food you should eat.  Drink enough fluid to keep your urine pale yellow.  Keep all follow-up visits as told by your health care provider. This is important. Contact a health care provider if you:  Feel or see any changes in your skin. Summary  A pressure injury, sometimes called a bedsore or a pressure ulcer, is an injury to the skin and underlying tissue caused by pressure.  Do not lie or sit in one position for a long time.  Check your skin at least once a day for any changes in color and for any new blisters or sores.  Make sure to check under and around any medical devices and between skin folds. Have a caregiver do this for you if you are not able.  Eat a healthy diet that includes protein, vitamins, and minerals. Ask your health care provider what types of food you should eat. This information is not intended to replace advice given to you by your health care provider. Make sure you discuss any questions you have with your health care provider. Document Released: 08/25/2004 Document Revised: 11/09/2018 Document Reviewed: 04/10/2018 Elsevier Patient Education  2020 [employer].

## 2019-04-26 NOTE — Progress Notes (Signed)
[facility]  ROUNDING NOTE   Subjective:  Patient well-known to [place] from prior admission. Previously had acute renal failure. Has developed another episode of acute renal failure. Came in with sacral wound. Most recent baseline creatinine was 1.09. Creatinine now up to 1.71.   Objective:  Vital signs in last 24 hours:  Temp:  [97.5 F (36.4 C)-98.1 F (36.7 C)] 97.6 F (36.4 C) (09/25 0923) Pulse Rate:  [65-113] 86 (09/25 0925) Resp:  [16-18] 17 (09/25 0923) BP: (69-133)/(51-115) 92/59 (09/25 0925) SpO2:  [98 %-100 %] 99 % (09/25 0925)  Weight change:  Filed Weights   04/21/19 1425 04/21/19 1857  Weight: 122.5 kg 120.4 kg    Intake/Output: I/O last 3 completed shifts: In: 680 [P.O.:480; IV Piggyback:200] Out: 3075 [Urine:3075]   Intake/Output this shift:  No intake/output data recorded.  Physical Exam: General: No acute distress  Head: Normocephalic, atraumatic. Moist oral mucosal membranes  Eyes: Anicteric  Neck: Supple, trachea midline  Lungs:  Clear to auscultation, normal effort  Heart: S1S2 no rubs  Abdomen:  Soft, nontender, bowel sounds present  Extremities: 2+ peripheral edema.  Neurologic: Awake, alert, following commands  Skin: No lesions       Basic Metabolic Panel: Recent Labs  Lab 04/21/19 2217 04/22/19 0605 04/23/19 0139 04/24/19 0502 04/25/19 0540 04/26/19 0650  NA 138 135 137 137 137 140  K 5.5* 4.6 4.7 4.5 4.5 4.6  CL 108 106 108 111 111 111  CO2 22 19* 22 18* 19* 20*  GLUCOSE 132* 116* 130* 126* 123* 162*  BUN 26* 26* 25* 30* 34* 38*  CREATININE 1.06 1.09 1.09 1.53* 1.78* 1.71*  CALCIUM 7.7* 7.7* 7.8* 7.9* 7.9* 8.0*  MG 1.9  --  2.0  --   --   --   PHOS 4.8*  --   --   --   --   --     Liver Function Tests: Recent Labs  Lab 04/21/19 1109 04/21/19 2217  AST 28 33  ALT 13 14  ALKPHOS 211* 174*  BILITOT 0.6 0.8  PROT 5.7* 5.1*  ALBUMIN 1.8* 1.7*   No results for input(s): LIPASE, AMYLASE in the last 168  hours. No results for input(s): AMMONIA in the last 168 hours.  CBC: Recent Labs  Lab 04/21/19 2217 04/22/19 0605  04/23/19 0139 04/23/19 0947 04/23/19 1326 04/25/19 0540 04/26/19 0650  WBC 13.6* 10.4  --  12.8*  --   --  14.6* 13.2*  NEUTROABS 11.6*  --   --  10.3*  --   --   --   --   HGB 6.7* 6.6*   < > 8.3* 8.3* 8.7* 8.4* 8.3*  HCT 21.7* 21.5*   < > 26.7* 26.8* 28.2* 27.5* 27.5*  MCV 85.8 84.6  --  86.4  --   --  88.1 89.6  PLT 357 272  --  283  --   --  293 283   < > = values in this interval not displayed.    Cardiac Enzymes: No results for input(s): CKTOTAL, CKMB, CKMBINDEX, TROPONINI in the last 168 hours.  BNP: Invalid input(s): POCBNP  CBG: Recent Labs  Lab 04/25/19 0732 04/25/19 1159 04/25/19 1632 04/25/19 2141 04/26/19 0739  GLUCAP 114* 110* 133* 102* 145*    Microbiology: Results for orders placed or performed during the hospital encounter of 04/21/19  Blood Culture (routine x 2)     Status: None   Collection Time: 04/21/19 11:10 AM   Specimen:  BLOOD  Result Value Ref Range Status   Specimen Description BLOOD RIGHT ANTECUBITAL  Final   Special Requests   Final    BOTTLES DRAWN AEROBIC AND ANAEROBIC Blood Culture adequate volume   Culture   Final    NO GROWTH 5 DAYS Performed at [facility], [address] [place], [place] [address]    Report Status 04/26/2019 FINAL  Final  Blood Culture (routine x 2)     Status: None   Collection Time: 04/21/19  1:22 PM   Specimen: BLOOD  Result Value Ref Range Status   Specimen Description BLOOD RIGHT HAND  Final   Special Requests   Final    BOTTLES DRAWN AEROBIC AND ANAEROBIC Blood Culture adequate volume   Culture   Final    NO GROWTH 5 DAYS Performed at [facility], [address], [place], [place] [address]    Report Status 04/26/2019 FINAL  Final  SARS Coronavirus 2 [facility] order, Performed in [facility] hospital lab) Nasopharyngeal Nasopharyngeal Swab     Status: None    Collection Time: 04/21/19  1:35 PM   Specimen: Nasopharyngeal Swab  Result Value Ref Range Status   SARS Coronavirus 2 NEGATIVE NEGATIVE Final    Comment: (NOTE) If result is NEGATIVE SARS-CoV-2 target nucleic acids are NOT DETECTED. The SARS-CoV-2 RNA is generally detectable in upper and lower  respiratory specimens during the acute phase of infection. The lowest  concentration of SARS-CoV-2 viral copies this assay can detect is 250  copies / mL. A negative result does not preclude SARS-CoV-2 infection  and should not be used as the sole basis for treatment or other  patient management decisions.  A negative result may occur with  improper specimen collection / handling, submission of specimen other  than nasopharyngeal swab, presence of viral mutation(s) within the  areas targeted by this assay, and inadequate number of viral copies  (<250 copies / mL). A negative result must be combined with clinical  observations, patient history, and epidemiological information. If result is POSITIVE SARS-CoV-2 target nucleic acids are DETECTED. The SARS-CoV-2 RNA is generally detectable in upper and lower  respiratory specimens dur ing the acute phase of infection.  Positive  results are indicative of active infection with SARS-CoV-2.  Clinical  correlation with patient history and other diagnostic information is  necessary to determine patient infection status.  Positive results do  not rule out bacterial infection or co-infection with other viruses. If result is PRESUMPTIVE POSTIVE SARS-CoV-2 nucleic acids MAY BE PRESENT.   A presumptive positive result was obtained on the submitted specimen  and confirmed on repeat testing.  While 2019 novel coronavirus  (SARS-CoV-2) nucleic acids may be present in the submitted sample  additional confirmatory testing may be necessary for epidemiological  and / or clinical management purposes  to differentiate between  SARS-CoV-2 and other Sarbecovirus  currently known to infect humans.  If clinically indicated additional testing with an alternate test  methodology [phone]) is advised. The SARS-CoV-2 RNA is generally  detectable in upper and lower respiratory sp ecimens during the acute  phase of infection. The expected result is Negative. Fact Sheet for Patients:  StrictlyIdeas.no Fact Sheet for Healthcare Providers: BankingDealers.co.za This test is not yet approved or cleared by the [place] FDA and has been authorized for detection and/or diagnosis of SARS-CoV-2 by FDA under an Emergency Use Authorization (EUA).  This EUA will remain in effect (meaning this test can be used) for the duration of  the COVID-19 declaration under Section 564(b)(1) of the Act, 21 U.S.C. section 360bbb-3(b)(1), unless the authorization is terminated or revoked sooner. Performed at [facility], [address] [place], [place] [address]   MRSA PCR Screening     Status: None   Collection Time: 04/21/19  7:00 PM   Specimen: Nasopharyngeal  Result Value Ref Range Status   MRSA by PCR NEGATIVE NEGATIVE Final    Comment:        The GeneXpert MRSA Assay (FDA approved for NASAL specimens only), is one component of a comprehensive MRSA colonization surveillance program. It is not intended to diagnose MRSA infection nor to guide or monitor treatment for MRSA infections. Performed at [facility], [place], [place] [address]   SARS CORONAVIRUS 2 (TAT 6-24 HRS) Nasopharyngeal Nasopharyngeal Swab     Status: None   Collection Time: 04/25/19  5:10 PM   Specimen: Nasopharyngeal Swab  Result Value Ref Range Status   SARS Coronavirus 2 NEGATIVE NEGATIVE Final    Comment: (NOTE) SARS-CoV-2 target nucleic acids are NOT DETECTED. The SARS-CoV-2 RNA is generally detectable in upper and lower respiratory specimens during the acute phase of infection. Negative results do not  preclude SARS-CoV-2 infection, do not rule out co-infections with other pathogens, and should not be used as the sole basis for treatment or other patient management decisions. Negative results must be combined with clinical observations, patient history, and epidemiological information. The expected result is Negative. Fact Sheet for Patients: SugarRoll.be Fact Sheet for Healthcare Providers: https://www.woods-mathews.com/ This test is not yet approved or cleared by the [place] FDA and  has been authorized for detection and/or diagnosis of SARS-CoV-2 by FDA under an Emergency Use Authorization (EUA). This EUA will remain  in effect (meaning this test can be used) for the duration of the COVID-19 declaration under Section 56 4(b)(1) of the Act, 21 U.S.C. section 360bbb-3(b)(1), unless the authorization is terminated or revoked sooner. Performed at [facility], [place] [address] [place], [place] [address]     Coagulation Studies: No results for input(s): LABPROT, INR in the last 72 hours.  Urinalysis: No results for input(s): COLORURINE, LABSPEC, PHURINE, GLUCOSEU, HGBUR, BILIRUBINUR, KETONESUR, PROTEINUR, UROBILINOGEN, NITRITE, LEUKOCYTESUR in the last 72 hours.  Invalid input(s): APPERANCEUR    Imaging: No results found.   Medications:   . ceFEPime (MAXIPIME) IV    . metronidazole Stopped (04/26/19 0237)   . aspirin  81 mg Oral Daily  . atorvastatin  80 mg Oral Daily  . Chlorhexidine Gluconate Cloth  6 each Topical Daily  . collagenase   Topical Daily  . feeding supplement (NEPRO CARB STEADY)  237 mL Oral TID BM  . finasteride  5 mg Oral Daily  . influenza vaccine adjuvanted  0.5 mL Intramuscular Tomorrow-1000  . insulin aspart  0-5 Units Subcutaneous QHS  . insulin aspart  0-9 Units Subcutaneous TID WC  . insulin aspart  6 Units Subcutaneous TID AC  . insulin detemir  10 Units Subcutaneous Daily  . lactobacillus   1 g Oral TID WC  . multivitamin-lutein  1 capsule Oral Daily  . mupirocin cream   Topical Daily  . niacin  1,000 mg Oral Daily  . oxybutynin  5 mg Oral TID  . pantoprazole  40 mg Oral Daily  . pneumococcal 23 valent vaccine  0.5 mL Intramuscular Tomorrow-1000  . tamsulosin  0.4 mg Oral QPC breakfast  . vitamin C  500 mg Oral BID  acetaminophen **OR** acetaminophen, albuterol, bisacodyl, HYDROcodone-acetaminophen, morphine injection, nitroGLYCERIN, ondansetron **OR** ondansetron (ZOFRAN) IV, senna-docusate  Assessment/ Plan:  68 y.o. male with diabetes mellitus type II, hypertension, obstructive sleep apnea, congestive heart failure, atrial fibrillation, hyperlipidemia, Crohn's disease, coronary artery disease, GERD, pacemaker, prior acute renal failure requiring dialysis treatment, now admitted with sacral wound and found to have acute renal failure.  1.  Acute kidney injury secondary to ATN.  Patient appears to have recurrent ATN at this point in time given infected wound and sepsis.  Blood pressures have been as low as 69/51.  Hesitant to give a significant amount of fluid as he has congestive heart failure with ejection fraction of 35% most recently.  We will start the patient on midodrine 10 mg 3 times daily in an effort to raise blood pressure and renal perfusion pressure.  Check renal ultrasound to make sure no underlying obstruction.  2.  Hypotension.  As above add midodrine 10 mg 3 times daily for now.  3.  Metabolic acidosis.  Secondary to acute kidney injury.  Continue to monitor serum bicarbonate closely.  Hold off on adding sodium bicarbonate at the moment.  4.  Anemia unspecified.  Likely related to his underlying kidney issues.  Hemoglobin 8.3.  Hold off on Epogen at this time.   LOS: 5 Colton Lamb 9/25/202010:15 AM

## 2019-04-26 NOTE — Consult Note (Signed)
Pharmacy Antibiotic Note  Colton Lamb is a 68 y.o. male admitted on 04/21/2019 with sepsis. Patient is a 68 y/o M BIBEMS from nursing home with shortness of breath and stage 4 sacral wound c/b bleeding. Necrotic tissue of ulcer debrided at bedside. Deep injury to the bone. Pharmacy has been consulted for vancomycin & cefepime dosing. Patient is also on metronidazole.    Plan: Change cefepime 2g IV Q8H to Q12H for CrCl between 30-60 ml/min.     Height: 5' 11"  (180.3 cm) Weight: 265 lb 6.9 oz (120.4 kg) IBW/kg (Calculated) : 75.3  Temp (24hrs), Avg:97.8 F (36.6 C), Min:97.5 F (36.4 C), Max:98.1 F (36.7 C)  Recent Labs  Lab 04/21/19 1109 04/21/19 1413 04/21/19 1645 04/21/19 2217 04/22/19 0605 04/23/19 0139 04/24/19 0502 04/25/19 0540 04/26/19 0650  WBC 18.5*  --   --  13.6* 10.4 12.8*  --  14.6* 13.2*  CREATININE 1.07  --   --  1.06 1.09 1.09 1.53* 1.78* 1.71*  LATICACIDVEN 4.7* 3.3* 2.5* 1.4  --   --   --   --   --     Estimated Creatinine Clearance: 54.6 mL/min (A) (by C-G formula based on SCr of 1.71 mg/dL (H)).    Allergies  Allergen Reactions  . Iodine Other (See Comments)    Shortness of breath, swelling and hives  . Shrimp [Shellfish Allergy] Other (See Comments)    SWELLING    HIVES    SHORTNESS OF BREATH  . Tetracycline Rash    Antimicrobials this admission: Vancomycin 9/20 >> 9/23 Cefepime 9/20 >>  Metronidazole 9/20 >>  Dose adjustments this admission: 9/21 vancomycin dose changed from 1238m Q18H to 10061mQ12H. 9/23 vancomycin 1000 mg q12h >> 1250 q24h  Microbiology results: 9/20 BCx: NG 9/20 MRSA PCR: negative  Thank you for allowing pharmacy to be a part of this patient's care.  WaRocky MorelPharmD 04/26/2019 8:41 AM

## 2019-04-26 NOTE — Progress Notes (Signed)
[facility] for Electrolyte Monitoring and Replacement   Recent Labs: Potassium (mmol/L)  Date Value  04/26/2019 4.6  11/22/2013 3.7   Magnesium (mg/dL)  Date Value  04/23/2019 2.0  11/22/2013 1.8   Calcium (mg/dL)  Date Value  04/26/2019 8.0 (L)   Calcium, Total (mg/dL)  Date Value  11/22/2013 9.0   Albumin (g/dL)  Date Value  04/21/2019 1.7 (L)  04/18/2018 4.3   Phosphorus (mg/dL)  Date Value  04/21/2019 4.8 (H)   Sodium (mmol/L)  Date Value  04/26/2019 140  05/09/2016 141  11/22/2013 133 (L)    Corrected Calcium: 9.54 mg/dL  Assessment: Colton Lamb is a 68 YO male admitted on 04/21/2019 with sepsis secondary to sacral wound. Patient has history of atrial flutter, chronic systolic heart failure, hyperlipidemia, hypertension, paroxysmal atrial fibrillation, and T2DM. Patient has acute blood loss anemia associated with a sacral wound requiring transfusion with 3 units PRBC.   Goal of Therapy:  Will keep K ~4, Mg ~2 in the setting of atrial fibrillation.   Plan:  K 4.6 today; pt has not required supplementation the past few days and pt out of the ICU where consult originated. No replacement warranted at this time.   Will sign off at this time.    Rayna Sexton, PharmD, BCPS Clinical Pharmacist 04/26/2019 8:51 AM

## 2019-04-30 ENCOUNTER — Emergency Department: Payer: Medicare Other

## 2019-04-30 ENCOUNTER — Emergency Department
Admission: EM | Admit: 2019-04-30 | Discharge: 2019-05-01 | Disposition: A | Discharge status: Skilled Nursing Facility | Payer: Medicare Other | Attending: Emergency Medicine | Admitting: Emergency Medicine

## 2019-04-30 ENCOUNTER — Other Ambulatory Visit: Payer: Self-pay

## 2019-04-30 DIAGNOSIS — Z20828 Contact with and (suspected) exposure to other viral communicable diseases: Secondary | ICD-10-CM | POA: Diagnosis not present

## 2019-04-30 DIAGNOSIS — I509 Heart failure, unspecified: Secondary | ICD-10-CM | POA: Insufficient documentation

## 2019-04-30 DIAGNOSIS — R0602 Shortness of breath: Secondary | ICD-10-CM | POA: Insufficient documentation

## 2019-04-30 DIAGNOSIS — Z79899 Other long term (current) drug therapy: Secondary | ICD-10-CM | POA: Insufficient documentation

## 2019-04-30 DIAGNOSIS — Z794 Long term (current) use of insulin: Secondary | ICD-10-CM | POA: Insufficient documentation

## 2019-04-30 DIAGNOSIS — Z7982 Long term (current) use of aspirin: Secondary | ICD-10-CM | POA: Diagnosis not present

## 2019-04-30 DIAGNOSIS — E119 Type 2 diabetes mellitus without complications: Secondary | ICD-10-CM | POA: Insufficient documentation

## 2019-04-30 DIAGNOSIS — I2510 Atherosclerotic heart disease of native coronary artery without angina pectoris: Secondary | ICD-10-CM | POA: Insufficient documentation

## 2019-04-30 DIAGNOSIS — Z7901 Long term (current) use of anticoagulants: Secondary | ICD-10-CM | POA: Insufficient documentation

## 2019-04-30 LAB — CBC WITH DIFFERENTIAL/PLATELET
Abs Immature Granulocytes: 0.15 10*3/uL — ABNORMAL HIGH (ref 0.00–0.07)
Basophils Absolute: 0.1 10*3/uL (ref 0.0–0.1)
Basophils Relative: 1 %
Eosinophils Absolute: 0.4 10*3/uL (ref 0.0–0.5)
Eosinophils Relative: 3 %
HCT: 32.7 % — ABNORMAL LOW (ref 39.0–52.0)
Hemoglobin: 9.6 g/dL — ABNORMAL LOW (ref 13.0–17.0)
Immature Granulocytes: 1 %
Lymphocytes Relative: 10 %
Lymphs Abs: 1.4 10*3/uL (ref 0.7–4.0)
MCH: 27 pg (ref 26.0–34.0)
MCHC: 29.4 g/dL — ABNORMAL LOW (ref 30.0–36.0)
MCV: 92.1 fL (ref 80.0–100.0)
Monocytes Absolute: 0.7 10*3/uL (ref 0.1–1.0)
Monocytes Relative: 6 %
Neutro Abs: 10.6 10*3/uL — ABNORMAL HIGH (ref 1.7–7.7)
Neutrophils Relative %: 79 %
Platelets: 324 10*3/uL (ref 150–400)
RBC: 3.55 MIL/uL — ABNORMAL LOW (ref 4.22–5.81)
RDW: 22.8 % — ABNORMAL HIGH (ref 11.5–15.5)
WBC: 13.3 10*3/uL — ABNORMAL HIGH (ref 4.0–10.5)
nRBC: 0 % (ref 0.0–0.2)

## 2019-04-30 LAB — COMPREHENSIVE METABOLIC PANEL
ALT: 15 U/L (ref 0–44)
AST: 23 U/L (ref 15–41)
Albumin: 2.3 g/dL — ABNORMAL LOW (ref 3.5–5.0)
Alkaline Phosphatase: 115 U/L (ref 38–126)
Anion gap: 8 (ref 5–15)
BUN: 20 mg/dL (ref 8–23)
CO2: 19 mmol/L — ABNORMAL LOW (ref 22–32)
Calcium: 8.5 mg/dL — ABNORMAL LOW (ref 8.9–10.3)
Chloride: 111 mmol/L (ref 98–111)
Creatinine, Ser: 0.97 mg/dL (ref 0.61–1.24)
GFR calc Af Amer: >60 mL/min (ref >60)
GFR calc non Af Amer: >60 mL/min (ref >60)
Glucose, Bld: 133 mg/dL — ABNORMAL HIGH (ref 70–99)
Potassium: 5.1 mmol/L (ref 3.5–5.1)
Sodium: 138 mmol/L (ref 135–145)
Total Bilirubin: 0.6 mg/dL (ref 0.3–1.2)
Total Protein: 6 g/dL — ABNORMAL LOW (ref 6.5–8.1)

## 2019-04-30 LAB — SARS CORONAVIRUS 2 BY RT PCR (HOSPITAL ORDER, PERFORMED IN ~~LOC~~ HOSPITAL LAB): SARS Coronavirus 2: NEGATIVE

## 2019-04-30 LAB — PROCALCITONIN: Procalcitonin: <0.1 ng/mL

## 2019-04-30 LAB — BRAIN NATRIURETIC PEPTIDE: B Natriuretic Peptide: 879 pg/mL — ABNORMAL HIGH (ref 0.0–100.0)

## 2019-04-30 LAB — MAGNESIUM: Magnesium: 1.8 mg/dL (ref 1.7–2.4)

## 2019-04-30 LAB — TROPONIN I (HIGH SENSITIVITY): Troponin I (High Sensitivity): 8 ng/L (ref <18)

## 2019-04-30 MED ORDER — FUROSEMIDE 10 MG/ML IJ SOLN
40.0000 mg | Freq: Once | INTRAMUSCULAR | Status: AC
  Administered 2019-04-30: 40 mg via INTRAVENOUS
  Filled 2019-04-30: qty 4

## 2019-04-30 NOTE — ED Provider Notes (Signed)
[facility] Emergency Department Provider Note  ____________________________________________   First MD Initiated Contact with Patient 04/30/19 2144     (approximate)  I have reviewed the triage vital signs and the nursing notes.   HISTORY  Chief Complaint Shortness of Breath    HPI Colton Lamb is a 68 y.o. male with atrial flutter on Xarelto, CHF with an EF of 35-40% on 7/10, coronary disease who was recently admitted for septic shock secondary to pneumonia and decubitus ulcer who now presents with worsening shortness of breath.  Patient says for the past 3-day worsening shortness of breath that occurs mostly at night, moderate, associated with increased swelling in his legs and cough, nothing makes it better, nothing makes it worse. Pt came with EMS and was placed on a NRB. However after calling facility he was never on oxygen and was 97% on room air.            Past Medical History:  Diagnosis Date   Atrial flutter ([place])    a. s/p Cardioversion 11/22/13, on amiodarone and Xarelto.   Chronic systolic heart failure ([place])    a. 10/2013 EF 20-25%, grade III DD, RV mildly dilated and sys fx mild/mod reduced;  b. 01/2014 Echo: EF 30-35%, gr3 DD, mod dil LA.   Coronary artery disease    a. s/p MI 2007/2015;  b. s/p prior PCI to the LAD/LCX/PDA/PL;  c. 2008: s/p Cypher DES to the OM.   Crohn's ileocolitis ([place])    GERD (gastroesophageal reflux disease)    Hx of adenomatous colonic polyps 11/2003   Hyperlipidemia    Hypertension    Ischemic cardiomyopathy    s. 01/2014 s/p MDT [id number] [name] XT DR single lead AICD.   Obesity    Paroxysmal atrial fibrillation (HCC)    a. CHA2DS2VASc = 4-->xarelto/amio.   Sleep apnea    Syncope    a.  11/2013 in setting of volume depletion and bradycardia due to dig toxicity    Type II diabetes mellitus [facility])     Patient Active Problem List   Diagnosis Date Noted   Acute blood loss anemia    Pressure  injury of skin 03/17/2019   Acute renal failure (ARF) (HCC)    Empyema lung (HCC)    Shortness of breath    Pleural effusion on right    Sepsis ([place]) 03/04/2019   Restless legs syndrome (RLS) 06/16/2017   Hypertension, essential 06/16/2017   Coronary artery disease of native artery of native heart with stable angina pectoris ([place]) 04/27/2017   Hydrocephalus ([place]) 09/08/2016   Dizziness and giddiness    Elevated troponin 09/06/2016   Type II diabetes mellitus ([place])    Ischemic cardiomyopathy    Hyponatremia 07/01/2015   Diabetes mellitus type 2, uncontrolled, with complications ([place]) 44/81/8563   ICD (implantable cardioverter-defibrillator) in place 05/27/2014   OSA (obstructive sleep apnea) 12/20/2013   Morbid obesity ([place]) 12/20/2013   AKI (acute kidney injury) - Creatinine improved at d/c 12/14/2013   Elevated TSH - will need f/u TFTs with PCP in 3-4 weeks 12/14/2013   Junctional bradycardia - resolved 12/14/2013   Syncope - due to bradycardia in setting of Digoxin Toxicity 14/97/0263   Chronic systolic heart failure ([place]) 12/06/2013   At risk for sudden cardiac death - on LifeVest 12-Dec-2013   Acute combined systolic and diastolic CHF, NYHA class 3 -- 2/2 MI 12/12/2013   Cardiomyopathy, ischemic 12-12-13   Atrial flutter ([place]) 11/22/2013   NSTEMI (non-ST  elevated myocardial infarction) ([place]) 11/22/2013   Long term current use of anticoagulant 10/19/2010   Hyperlipidemia 05/06/2010   Edema 05/06/2010   CAD S/P percutaneous coronary angioplasty - multiple PCIs 11/11/2008   Atrial fibrillation ([place]) 11/11/2008   GERD 11/11/2008   CROHN'S DISEASE-LARGE & SMALL INTESTINE 11/11/2008   COLONIC POLYPS, HX OF 11/11/2008   SHELLFISH ALLERGY 11/11/2008    Past Surgical History:  Procedure Laterality Date   ATRIAL FLUTTER ABLATION N/A 04/16/2014   Procedure: ATRIAL FLUTTER ABLATION;  Surgeon: Evans Lance, MD;  Location: [facility] CATH LAB;   Service: Cardiovascular;  Laterality: N/A;   CARDIAC CATHETERIZATION  10/2013   CARDIAC DEFIBRILLATOR PLACEMENT  04/16/2014   Medtronic Evira device   CARDIAC ELECTROPHYSIOLOGY STUDY AND ABLATION  04/16/2014   atrial flutter ablation   CARDIOVERSION N/A 03/05/2014   Procedure: CARDIOVERSION;  Surgeon: Jolaine Artist, MD;  Location: [place];  Service: Cardiovascular;  Laterality: N/A;   CATARACT EXTRACTION W/PHACO Right 01/04/2017   Procedure: CATARACT EXTRACTION PHACO AND INTRAOCULAR LENS PLACEMENT ([place])  Right Diabetic Complicated;  Surgeon: Leandrew Koyanagi, MD;  Location: [place];  Service: Ophthalmology;  Laterality: Right;  Diabetic   CATARACT EXTRACTION W/PHACO Left 02/08/2017   Procedure: CATARACT EXTRACTION PHACO AND INTRAOCULAR LENS PLACEMENT (IOC) left diabetic;  Surgeon: Leandrew Koyanagi, MD;  Location: [place];  Service: Ophthalmology;  Laterality: Left;  Diabetic - oral meds sleep apnea   CORONARY ANGIOPLASTY WITH STENT PLACEMENT  2007; 2008 X 2   "1+1 ~ 1"   FOOT SURGERY Left    bone spur   HYDROCELE EXCISION Bilateral    Ileocecal resection and sigmoid enterocolonic fistula repair  09/1998   IMPLANTABLE CARDIOVERTER DEFIBRILLATOR IMPLANT N/A 04/16/2014   Procedure: IMPLANTABLE CARDIOVERTER DEFIBRILLATOR IMPLANT;  Surgeon: Evans Lance, MD;  Location: [facility] CATH LAB;  Service: Cardiovascular;  Laterality: N/A;   LEFT HEART CATHETERIZATION WITH CORONARY ANGIOGRAM N/A 11/22/2013   Procedure: LEFT HEART CATHETERIZATION WITH CORONARY ANGIOGRAM;  Surgeon: Sinclair Grooms, MD;  Location: [facility] CATH LAB;  Service: Cardiovascular;  Laterality: N/A;    Prior to Admission medications   Medication Sig Start Date End Date Taking? Authorizing Provider  acetaminophen (TYLENOL) 325 MG tablet Take 1-2 tablets (325-650 mg total) by mouth every 4 (four) hours as needed for mild pain. 04/17/14   [name], NP  Albuterol Sulfate 108 (90 Base) MCG/ACT  AEPB Inhale 1 puff into the lungs every 6 (six) hours as needed (shortness of breath).     [provider]  aspirin 81 MG tablet Take 81 mg by mouth daily.     [provider]  atorvastatin (LIPITOR) 80 MG tablet TAKE 1 TABLET BY MOUTH EVERY DAY Patient taking differently: Take 80 mg by mouth daily.  03/08/19   Minna Merritts, MD  balsalazide (COLAZAL) 750 MG capsule TAKE 1 CAPSULE (750 MG TOTAL) BY MOUTH 3 (THREE) TIMES DAILY. Patient taking differently: Take 750 mg by mouth 3 (three) times daily.  11/28/18   Ladene Artist, MD  cephALEXin (KEFLEX) 250 MG capsule Take 1 capsule (250 mg total) by mouth 2 (two) times daily for 5 days. 04/26/19 05/01/19  Max Sane, MD  finasteride (PROSCAR) 5 MG tablet Take 1 tablet (5 mg total) by mouth daily. 03/20/19   Loletha Grayer, MD  insulin aspart (NOVOLOG) 100 UNIT/ML injection Inject 5 Units into the skin 3 (three) times daily with meals. Patient taking differently: Inject 6 Units into the skin 3 (three)  times daily before meals.  03/19/19   Loletha Grayer, MD  insulin detemir (LEVEMIR) 100 UNIT/ML injection Inject 0.13 mLs (13 Units total) into the skin daily. Patient taking differently: Inject 20 Units into the skin daily.  03/19/19   Loletha Grayer, MD  lactobacillus (FLORANEX/LACTINEX) PACK Take 1 packet (1 g total) by mouth 3 (three) times daily with meals. 03/19/19   Loletha Grayer, MD  midodrine (PROAMATINE) 10 MG tablet Take 1 tablet (10 mg total) by mouth 3 (three) times daily. 04/26/19   Max Sane, MD  Multiple Vitamins-Minerals (MULTIVITAMIN WITH MINERALS) tablet Take 1 tablet by mouth daily.    [provider]  niacin (NIASPAN) 1000 MG CR tablet Take 1,000 mg by mouth daily. 04/10/19   [provider]  Nutritional Supplements (FEEDING SUPPLEMENT, NEPRO CARB STEADY,) LIQD Take 237 mLs by mouth 2 (two) times daily between meals. 03/19/19   Loletha Grayer, MD  omeprazole (PRILOSEC) 20 MG capsule Take 20 mg  by mouth daily.     [provider]  potassium chloride SA (K-DUR) 20 MEQ tablet Take 1 tablet (20 mEq total) by mouth daily. 04/09/19   Minna Merritts, MD  sodium hypochlorite (DAKIN'S 1/2 STRENGTH) external solution Irrigate with 1 application as directed 2 (two) times daily. (apply to sacral wound)    [provider]  tamsulosin (FLOMAX) 0.4 MG CAPS capsule Take 1 capsule (0.4 mg total) by mouth daily after breakfast. 03/20/19   Loletha Grayer, MD  vitamin C (ASCORBIC ACID) 500 MG tablet Take 500 mg by mouth 2 (two) times daily.    [provider]  XARELTO 20 MG TABS tablet TAKE 1 TABLET BY MOUTH EVERY DAY WITH LUNCH Patient taking differently: Take 20 mg by mouth daily.  11/23/18   Minna Merritts, MD    Allergies Iodine, Shrimp [shellfish allergy], and Tetracycline  Family History  Problem Relation Age of Onset   Breast cancer Mother    Heart disease Father    Heart attack Father    Colon cancer Neg Hx     Social History Social History   Tobacco Use   Smoking status: Never Smoker   Smokeless tobacco: Never Used  Substance Use Topics   Alcohol use: No   Drug use: No      Review of Systems Constitutional: No fever/chills Eyes: No visual changes. ENT: No sore throat. Cardiovascular: No chest pain Respiratory: Positive for SOB, + cough Gastrointestinal: No abdominal pain.  No nausea, no vomiting.  No diarrhea.  No constipation. Genitourinary: Negative for dysuria. Musculoskeletal: Negative for back pain. Skin: Negative for rash. Neurological: Negative for headaches, focal weakness or numbness. All other ROS negative ____________________________________________   PHYSICAL EXAM:  VITAL SIGNS: Blood pressure (!) 125/98, pulse (!) 114, temperature 97.6 F (36.4 C), temperature source Axillary, resp. rate (!) 26, height 5' 11"  (1.803 m), weight 122.5 kg, SpO2 100 %.   Constitutional: Alert and oriented. Well appearing in mild  distress with SOB  Eyes: Conjunctivae are normal. EOMI. Head: Atraumatic. Nose: No congestion/rhinnorhea. Mouth/Throat: Mucous membranes are moist.   Neck: No stridor. Trachea Midline. FROM Cardiovascular: Normal rate, regular rhythm. Grossly normal heart sounds.  Good peripheral circulation. Respiratory: placed on 2L, mild increased WOB Gastrointestinal: Soft and nontender. No distention. No abdominal bruits.  Musculoskeletal: 2+ edema bilaterally No joint effusions. Neurologic:  Normal speech and language. No gross focal neurologic deficits are appreciated.  Skin:  Skin is warm, dry and intact. No rash noted. Psychiatric: Mood and affect  are normal. Speech and behavior are normal. GU: foley place   ____________________________________________   LABS (all labs ordered are listed, but only abnormal results are displayed)  Labs Reviewed  CBC WITH DIFFERENTIAL/PLATELET - Abnormal; Notable for the following components:      Result Value   WBC 13.3 (*)    RBC 3.55 (*)    Hemoglobin 9.6 (*)    HCT 32.7 (*)    MCHC 29.4 (*)    RDW 22.8 (*)    Neutro Abs 10.6 (*)    Abs Immature Granulocytes 0.15 (*)    All other components within normal limits  COMPREHENSIVE METABOLIC PANEL - Abnormal; Notable for the following components:   CO2 19 (*)    Glucose, Bld 133 (*)    Calcium 8.5 (*)    Total Protein 6.0 (*)    Albumin 2.3 (*)    All other components within normal limits  BRAIN NATRIURETIC PEPTIDE - Abnormal; Notable for the following components:   B Natriuretic Peptide 879.0 (*)    All other components within normal limits  SARS CORONAVIRUS 2 (HOSPITAL ORDER, [place] [facility])  MAGNESIUM  PROCALCITONIN  PROCALCITONIN  TROPONIN I (HIGH SENSITIVITY)  TROPONIN I (HIGH SENSITIVITY)   ____________________________________________   ED ECG REPORT I, Vanessa Finland, the attending physician, personally viewed and interpreted this ECG.  Afib with rate of 110, no  st elevation, no twi, normal intervals.  ____________________________________________  RADIOLOGY Robert Bellow, personally viewed and evaluated these images (plain radiographs) as part of my medical decision making, as well as reviewing the written report by the radiologist.  ED MD interpretation:  Xray with R pleural effusion   Official radiology report(s): Dg Chest Portable 1 View  Result Date: 04/30/2019 CLINICAL DATA:  Shortness of breath EXAM: PORTABLE CHEST 1 VIEW COMPARISON:  Radiograph 04/21/2019, CT March 11, 2019 FINDINGS: Suspect interval increase in the size of the right pleural effusion now tracking within the minor fissure and with increasing apical capping. Adjacent areas of opacity likely reflect atelectasis. There is cardiomegaly which is similar to prior exams. Dual lead pacer/defibrillator pack overlies the left chest wall with leads in the right atrium and cardiac apex. No convincing evidence of pulmonary edema at this time. No acute osseous or soft tissue abnormality. IMPRESSION: 1. Suspect interval increase in the size of the right pleural effusion. Adjacent areas of opacity likely reflect atelectasis. 2. No convincing evidence of pulmonary edema at this time. 3. Stable cardiomegaly Electronically Signed   By: Lovena Le M.D.   On: 04/30/2019 22:11    ____________________________________________   PROCEDURES  Procedure(s) performed (including Critical Care):  Procedures   ____________________________________________   INITIAL IMPRESSION / ASSESSMENT AND PLAN / ED COURSE   Colton Lamb was evaluated in Emergency Department on 04/30/2019 for the symptoms described in the history of present illness. He was evaluated in the context of the global COVID-19 pandemic, which necessitated consideration that the patient might be at risk for infection with the SARS-CoV-2 virus that causes COVID-19. Institutional protocols and algorithms that pertain to the evaluation  of patients at risk for COVID-19 are in a state of rapid change based on information released by regulatory bodies including the CDC and federal and state organizations. These policies and algorithms were followed during the patient's care in the ED.     Pt presents with SOB. Most concerning for fluid overload given significant leg swelling. He is not on lasix due to  some low blood pressures making it tough to balance. Will give 49m IV lasix and carefully watch blood pressure.   PNA-will get xray to evaluation given recent diagnosis of this.  Anemia-CBC to evaluate given prior anemia from sacral ulcer wound  ACS- will get trops Arrhythmia-Will get EKG and keep on monitor.  COVID- will get testing per algorithm. PE-on a blood thinner.   Pt white count still elevated at 13.3 -->procalc is negative though   Hgb at baseline   Troponin 8   BNP elevated around baseline 879    Pt currently on room air.  Does have leg swelling but xray without obvious edema. Will get CT scan to further evaluate.  If CT does not show effusion amenable to draining, and pt remains not on oxygen, I have low suspicion this is from PNA and he may be able to go back to facility and f/u with heart failure clinic outpatient. If CT concerning then would admit patient.     ____________________________________________   FINAL CLINICAL IMPRESSION(S) / ED DIAGNOSES   Final diagnoses:  SOB (shortness of breath)     MEDICATIONS GIVEN DURING THIS VISIT:  Medications  furosemide (LASIX) injection 40 mg (has no administration in time range)     ED Discharge Orders    None       Note:  This document was prepared using Dragon voice recognition software and may include unintentional dictation errors.   FVanessa Bee MD 04/30/19 2322

## 2019-04-30 NOTE — Discharge Instructions (Addendum)
Pt workup was re-assuring. There was not a ton of fluid in his lungs but he does have swelling in his legs.  I have placed referral for CHF clinic to get repeat echo and discuss further management. Take antibiotic as prescribed. Return to the ER for worsening symptoms, persistent vomiting, difficulty breathing or other concerns.

## 2019-04-30 NOTE — ED Triage Notes (Signed)
Patient from [facility] facility. Patient arrived by EMS with complaints of SOB. Patient arrived on Non re breather at 15 liter. Per staff patient has been getting this way in the evening but during the day is normal. Patient has swelling in extremities 2-3 + pitting. Patient alert and oriented x 4. 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

## 2019-04-30 NOTE — ED Notes (Signed)
Per nurse at facility ([facility]) patient vitals normal at facility before EMS arrived. Oxygen level 97% room air.

## 2019-05-01 ENCOUNTER — Encounter: Payer: Self-pay | Admitting: Cardiology

## 2019-05-01 ENCOUNTER — Other Ambulatory Visit: Payer: Self-pay

## 2019-05-01 DIAGNOSIS — R0602 Shortness of breath: Secondary | ICD-10-CM | POA: Diagnosis not present

## 2019-05-01 MED ORDER — AMOXICILLIN-POT CLAVULANATE 875-125 MG PO TABS: 1.0000 | ORAL_TABLET | Freq: Two times a day (BID) | ORAL | 0 refills | Status: DC

## 2019-05-01 MED ORDER — AMOXICILLIN-POT CLAVULANATE 875-125 MG PO TABS
1.0000 | ORAL_TABLET | Freq: Once | ORAL | Status: AC
  Administered 2019-05-01: 1 via ORAL

## 2019-05-01 NOTE — ED Provider Notes (Signed)
-----------------------------------------   12:06 AM on 05/01/2019 -----------------------------------------  CT chest interpreted per Dr. Quintella Reichert:  1. Interval removal of the right-sided pigtail chest tube with  significant interval increase in the size of the pleural effusions  bilaterally.  2. Air within the right pleural effusion likely introduced  iatrogenically via the chest tube although an empyema is not  entirely excluded. Clinical correlation is recommended.  3. Patchy consolidative changes of the right lower lobe may  represent atelectasis or pneumonia. Clinical correlation and  follow-up to resolution recommended.  4. Cardiomegaly with small pericardial effusion as seen on the prior  CT.   Patient has been saturating 100% on room air for over 45 minutes.  He is resting without distress.  Reviewed old records; does not appear he was discharged on antibiotic.  Will cover him for right lower lobe patchy consolidative changes.  No large effusion amenable to draining.  COVID is negative.  Will discharge back to nursing facility.  Strict return precautions given.  Patient verbalizes understanding agrees with plan of care.   Paulette Blanch, MD 05/01/19 [phone]

## 2019-05-02 ENCOUNTER — Other Ambulatory Visit: Payer: Self-pay

## 2019-05-02 ENCOUNTER — Non-Acute Institutional Stay: Payer: Medicare Other | Admitting: Primary Care

## 2019-05-02 DIAGNOSIS — Z515 Encounter for palliative care: Secondary | ICD-10-CM

## 2019-05-02 NOTE — Progress Notes (Signed)
[facility] Palliative Care Consult Note Telephone: [phone]  Fax: [phone]  TELEHEALTH VISIT STATEMENT Due to the COVID-19 crisis, this visit was done via telemedicine from my office. It was initiated and consented to by this patient and/or family.  PATIENT NAME: Colton Lamb [address] [place] [address] [phone] (home)  DOB: 07-11-1951 MRN: [id number]  PRIMARY CARE PROVIDER:  [name] MD REFERRING PROVIDER: [name] MD  RESPONSIBLE PARTY:   Extended Emergency Contact Information Primary Emergency Contact: [place] Phone: [phone] Mobile Phone: [phone] Relation: Son Secondary Emergency Contact: [facility] Address: [place],  [address] [address] [place] of [place] Phone: [phone] Relation: Sister   ASSESSMENT AND RECOMMENDATIONS:   1. Advance Care Planning/Goals of Care: Goals include to maximize quality of life and symptom management. Has son in [place], I will discuss advance directives. Unclear as to POA for patient. I will reach out to POA due to patient's altered mental status.  2. Symptom Management:   Edema: Has edema to chest, recommend diuretic. Patient also has po potassium ordered and recent K+= 5.4 now. Previously 5.1 several days ago.  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 This may be source of recurrent infection. Currently being Rx at SNF. Has appt for wound center.  Pain: Denies pain but may have some unvoiced due to decubitus ulcer.  However on interview he did deny pain and appeared relaxed.  Disposition: Had been referred for Palliative care at home but was hospitalized and went to rehab before initial home visit was made. Patient was in hospital for 15 days with sepsis, and returned for SOB. Has periods of mental clarity and then altered mental status. He will likely need extended care for some time in order  to treat a large decubitus ulcer on his sacrum.   Depression/delirium: Recommend addressing hallucinations with Seroquel 25 mg bid to begin, titrate to effect. Recommend anti depressant as well.  3. Family /Caregiver/Community Supports: Has son in [place], local sister and mother who he was going to care for. POA for patient unclear.  4. Cognitive / Functional decline:  Poor cognition, recent delirium and hallucinations, not his baseline. Feeds self, drinks but has to be moved by [employer] lift. Lived at home before August when admitted for therapy.  5. Follow up Palliative Care Visit: Palliative care will continue to follow for goals of care clarification and symptom management. Return 1-2 weeks or prn.  I spent 45 minutes providing this consultation,  from 1400 to 1445. More than 50% of the time in this consultation was spent coordinating communication.   HISTORY OF PRESENT ILLNESS:  Colton Lamb is a 68 y.o. year old male with multiple medical problems including afib, CAD, obesity, DM, decubitus ulcer, altered mental status. Palliative Care was asked to follow this patient by consultation request of.ref to help address advance care planning and goals of care. This is an initial visit.  CODE STATUS: FULL CODE  PPS: 30% HOSPICE ELIGIBILITY/DIAGNOSIS: TBD  PAST MEDICAL HISTORY:  Past Medical History:  Diagnosis Date  . Atrial flutter ([place])    a. s/p Cardioversion 11/22/13, on amiodarone and Xarelto.  . Chronic systolic heart failure ([place])    a. 10/2013 EF 20-25%, grade III DD, RV mildly dilated and sys fx mild/mod reduced;  b. 01/2014 Echo: EF 30-35%, gr3 DD, mod dil LA.  Marland Kitchen Coronary artery  disease    a. s/p MI 2007/2015;  b. s/p prior PCI to the LAD/LCX/PDA/PL;  c. 2008: s/p Cypher DES to the OM.  Marland Kitchen Crohn's ileocolitis ([place])   . GERD (gastroesophageal reflux disease)   . Hx of adenomatous colonic polyps 11/2003  . Hyperlipidemia   . Hypertension   . Ischemic cardiomyopathy    s. 01/2014 s/p  MDT [id number] [name] XT DR single lead AICD.  Marland Kitchen Obesity   . Paroxysmal atrial fibrillation (HCC)    a. CHA2DS2VASc = 4-->xarelto/amio.  . Sleep apnea   . Syncope    a.  11/2013 in setting of volume depletion and bradycardia due to dig toxicity   . Type II diabetes mellitus ([place])     SOCIAL HX:  Social History   Tobacco Use  . Smoking status: Never Smoker  . Smokeless tobacco: Never Used  Substance Use Topics  . Alcohol use: No    ALLERGIES:  Allergies  Allergen Reactions  . Iodine Other (See Comments)    Shortness of breath, swelling and hives  . Shrimp [Shellfish Allergy] Other (See Comments)    SWELLING    HIVES    SHORTNESS OF BREATH  . Tetracycline Rash     PERTINENT MEDICATIONS:  Outpatient Encounter Medications as of 05/02/2019  Medication Sig  . acetaminophen (TYLENOL) 325 MG tablet Take 1-2 tablets (325-650 mg total) by mouth every 4 (four) hours as needed for mild pain.  . Albuterol Sulfate 108 (90 Base) MCG/ACT AEPB Inhale 1 puff into the lungs every 6 (six) hours as needed (shortness of breath).   Marland Kitchen amoxicillin-clavulanate (AUGMENTIN) 875-125 MG tablet Take 1 tablet by mouth 2 (two) times daily.  Marland Kitchen aspirin 81 MG tablet Take 81 mg by mouth daily.   Marland Kitchen atorvastatin (LIPITOR) 80 MG tablet TAKE 1 TABLET BY MOUTH EVERY DAY (Patient taking differently: Take 80 mg by mouth daily. )  . balsalazide (COLAZAL) 750 MG capsule TAKE 1 CAPSULE (750 MG TOTAL) BY MOUTH 3 (THREE) TIMES DAILY. (Patient taking differently: Take 750 mg by mouth 3 (three) times daily. )  . dextromethorphan-guaiFENesin (MUCINEX DM) 30-600 MG 12hr tablet Take 1 tablet by mouth 2 (two) times daily.  Marland Kitchen dextromethorphan-guaiFENesin (ROBITUSSIN-DM) 10-100 MG/5ML liquid Take 10 mLs by mouth every 4 (four) hours as needed for cough.  . finasteride (PROSCAR) 5 MG tablet Take 1 tablet (5 mg total) by mouth daily.  . insulin aspart (NOVOLOG) 100 UNIT/ML injection Inject 5 Units into the skin 3 (three) times daily with  meals. (Patient taking differently: Inject 5 Units into the skin 3 (three) times daily before meals. )  . insulin detemir (LEVEMIR) 100 UNIT/ML injection Inject 0.13 mLs (13 Units total) into the skin daily.  Marland Kitchen lactobacillus (FLORANEX/LACTINEX) PACK Take 1 packet (1 g total) by mouth 3 (three) times daily with meals.  . midodrine (PROAMATINE) 10 MG tablet Take 1 tablet (10 mg total) by mouth 3 (three) times daily.  . Multiple Vitamins-Minerals (MULTIVITAMIN WITH MINERALS) tablet Take 1 tablet by mouth daily.  . niacin (NIASPAN) 1000 MG CR tablet Take 1,000 mg by mouth daily.  . Nutritional Supplements (FEEDING SUPPLEMENT, NEPRO CARB STEADY,) LIQD Take 237 mLs by mouth 2 (two) times daily between meals.  Marland Kitchen omeprazole (PRILOSEC) 20 MG capsule Take 20 mg by mouth daily.   . potassium chloride SA (K-DUR) 20 MEQ tablet Take 1 tablet (20 mEq total) by mouth daily.  . sodium hypochlorite (DAKIN'S 1/2 STRENGTH) external solution Irrigate with 1 application as directed  2 (two) times daily. (apply to sacral wound)  . tamsulosin (FLOMAX) 0.4 MG CAPS capsule Take 1 capsule (0.4 mg total) by mouth daily after breakfast.  . vitamin C (ASCORBIC ACID) 500 MG tablet Take 500 mg by mouth 2 (two) times daily.  Alveda Reasons 20 MG TABS tablet TAKE 1 TABLET BY MOUTH EVERY DAY WITH LUNCH (Patient taking differently: Take 20 mg by mouth daily. )  . zinc sulfate 220 (50 Zn) MG capsule Take 220 mg by mouth daily.   No facility-administered encounter medications on file as of 05/02/2019.     PHYSICAL EXAM / ROS:   Current and past weights: 270 lb, now 293 lb, likely fluid weight General: NAD, frail, obese Cardiovascular: no chest pain reported, edema to chest per staff. Pulmonary: no cough, no increased SOB,  Not DOE with speaking, Room air Abdomen: appetite recently decreased, endorses constipation, incontinent of bowel GU: denies dysuria, foley cath MSK:  Non ambulatory,  Skin: Deep decubitus, stage 4 with bone  visible, 11 x 10 x 5  Cm recorded on 9/18. Recent sepsis from wound and pneumonia Neurological: Delirium, having hallucinations, but can speak to present people.  Cyndia Skeeters DNP AGPCNP-BC

## 2019-05-07 NOTE — Progress Notes (Signed)
Virtual Visit via Video Note   This visit type was conducted due to national recommendations for restrictions regarding the COVID-19 Pandemic (e.g. social distancing) in an effort to limit this patient's exposure and mitigate transmission in our community.  Due to his co-morbid illnesses, this patient is at least at moderate risk for complications without adequate follow up.  This format is felt to be most appropriate for this patient at this time.  All issues noted in this document were discussed and addressed.  A limited physical exam was performed with this format.  Please refer to the patient's chart for his consent to telehealth for [facility].   I connected with  Colton Lamb on 05/08/19 by a video enabled telemedicine application and verified that I am speaking with the correct person using two identifiers. I discussed the limitations of evaluation and management by telemedicine. The patient expressed understanding and agreed to proceed.   Evaluation Performed:  Follow-up visit  Date:  05/08/2019   ID:  Colton Lamb, DOB [birth year], MRN [id number]  Patient Location:  [place] [address]   Provider location:   [facility], [place] office  PCP:  [name], MD  Cardiologist:  Patsy Baltimore   Chief Complaint: Shortness of breath   History of Present Illness:    Colton Lamb is a 68 y.o. male who presents via audio/video conferencing for a telehealth visit today.   The patient does not symptoms concerning for COVID-19 infection (fever, chills, cough, or new SHORTNESS OF BREATH).   Patient has a past medical history of paroxysmal atrial fibrillation,   hyperlipidemia,  Crohn's ileocolitis,  coronary artery disease, PCI x3 to the LAD, circumflex, PDA and PL branch, catheterization in 2008  with a cypher stent to the OM,  Ejection fraction 30-35% in July 2015 by echo sleep apnea, has not been using CPAP poorly  controlled diabetes, S/p catheter ablation of atrial flutter and insertion of a DDD ICD. no syncope or ICD shock. He presents today for follow-up of his coronary artery disease, atrial fibrillation  Long hospital course 03/2019, Spent most of the month in the hospital Diagnosed with sepsis, pleural effusion, renal failure, empyema Had septic shock from pneumonia Treated with broad-spectrum antibiotics Felt it could have been from aspiration Had hypoxic respiratory failure, was weaned off the oxygen Creatinine up to 4 down to 1.7 at time of discharge Torsemide was held at that time May have started to develop sacral wound at that time  04/21/19 admitted Sepsis, pressure sacral injury Anemia, transfusion x 3  Recently seen in the emergency room April 30, 2019 for shortness of breath CT scan chest with bilateral pleural effusions   BNP more than 800 Having wheezing [facility]  On ABX, currently Urine with yeast, delerium still Discussed with nursing  Used to be on  torsemide 20 mg BID alternating with 40 BID Is on low-dose Lasix 20 daily at this time  Entresto carvedilol held, he is on midodrine for blood pressure support  Albumin is around 2 Likely component of third spacing BNP 879 CR 0.75 yesterday  Other past medical history reviewed  in the ER in May 2017, stomach bug, N/V, diarrhea Potassium noted to be mildly low at that time  February had episodes of Vertigo, dry heaves Multiple epsiodes Admitted to hospital 09/04/2016 N/V Potassium 2.8 Improved up to 3.9 on recheck  Admitted in April  2015 with atrial flutter, non-ST elevation MI, with cardioversion, cardiac catheterization showing occluded LAD,  moderate distal RCA disease, patent left circumflex.. Medical management recommended.  Severely depressed ejection fraction noted, LifeVest was placed at that time. Ejection fraction 20-25%   hospital admission May 13 with  discharge 12/14/2013 with dig toxicity, junctional rhythm, bradycardia with heart rates in the 30s, syncope.  Acute renal failure likely secondary to ATN. Also noted to have TSH of 10. ACE inhibitor, spironolactone, isosorbide was held. Renal function improved at the time of discharge  Followup echocardiogram 01/29/2014 showing improved ejection fraction up to 30-35%   Prior CV studies:   The following studies were reviewed today:    Past Medical History:  Diagnosis Date  . Atrial flutter ([place])    a. s/p Cardioversion 11/22/13, on amiodarone and Xarelto.  . Chronic systolic heart failure ([place])    a. 10/2013 EF 20-25%, grade III DD, RV mildly dilated and sys fx mild/mod reduced;  b. 01/2014 Echo: EF 30-35%, gr3 DD, mod dil LA.  Marland Kitchen Coronary artery disease    a. s/p MI 2007/2015;  b. s/p prior PCI to the LAD/LCX/PDA/PL;  c. 2008: s/p Cypher DES to the OM.  Marland Kitchen Crohn's ileocolitis ([place])   . GERD (gastroesophageal reflux disease)   . Hx of adenomatous colonic polyps 11/2003  . Hyperlipidemia   . Hypertension   . Ischemic cardiomyopathy    s. 01/2014 s/p MDT DDBB1D1 [name] XT DR single lead AICD.  Marland Kitchen Obesity   . Paroxysmal atrial fibrillation (HCC)    a. CHA2DS2VASc = 4-->xarelto/amio.  . Sleep apnea   . Syncope    a.  11/2013 in setting of volume depletion and bradycardia due to dig toxicity   . Type II diabetes mellitus ([place])    Past Surgical History:  Procedure Laterality Date  . ATRIAL FLUTTER ABLATION N/A 04/16/2014   Procedure: ATRIAL FLUTTER ABLATION;  Surgeon: Evans Lance, MD;  [facility] C[facility] LAB;  Service: Cardiovascular;  Laterality: N/A;  . CARDIAC CATHETERIZATION  10/2013  . CARDIAC DEFIBRILLATOR PLACEMENT  04/16/2014   Medtronic Evira device  . CARDIAC ELECTROPHYSIOLOGY STUDY AND ABLATION  04/16/2014   atrial flutter ablation  . CARDIOVERSION N/A 03/05/2014   Procedure: CARDIOVERSION;  Surgeon: Jolaine Artist, MD;  Location: [facility] ENDOSCOPY;  Service: Cardiovascular;   Laterality: N/A;  . CATARACT EXTRACTION W/PHACO Right 01/04/2017   Procedure: CATARACT EXTRACTION PHACO AND INTRAOCULAR LENS PLACEMENT ([place])  Right Diabetic Complicated;  Surgeon: Leandrew Koyanagi, MD;  Location: [facility];  Service: Ophthalmology;  Laterality: Right;  Diabetic  . CATARACT EXTRACTION W/PHACO Left 02/08/2017   Procedure: CATARACT EXTRACTION PHACO AND INTRAOCULAR LENS PLACEMENT ([place]) left diabetic;  Surgeon: Leandrew Koyanagi, MD;  Location: ;  Service: Ophthalmology;  Laterality: Left;  Diabetic - oral meds sleep apnea  . CORONARY ANGIOPLASTY WITH STENT PLACEMENT  2007; 2008 X 2   "1+1 ~ 1"  . FOOT SURGERY Left    bone spur  . HYDROCELE EXCISION Bilateral   . 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 IMPLANTABLE CARDIOVERTER DEFIBRILLATOR IMPLANT N/A 04/16/2014   Procedure: IMPLANTABLE CARDIOVERTER DEFIBRILLATOR IMPLANT;  Surgeon: Evans Lance, MD;  Location[facility] C[facility] LAB;  Service: Cardiovascular;  Laterality: N/A;  . LEFT HEART CATHETERIZATION WITH CORONARY ANGIOGRAM N/A 11/22/2013   Procedure: LEFT HEART CATHETERIZATION WITH CORONARY ANGIOGRAM;  Surgeon: Sinclair Grooms, MD;  Location[facility] LAB;  Service: Cardiovascular;  Laterality: N/A;  Allergies:   Iodine, Shrimp [shellfish allergy], and Tetracycline   Social History   Tobacco Use  . Smoking status: Never Smoker  . Smokeless tobacco: Never Used  Substance Use Topics  . Alcohol use: No  . Drug use: No     Current Outpatient Medications on File Prior to Visit  Medication Sig Dispense Refill  . acetaminophen (TYLENOL) 325 MG tablet Take 1-2 tablets (325-650 mg total) by mouth every 4 (four) hours as needed for mild pain.    . Albuterol Sulfate 108 (90 Base) MCG/ACT AEPB Inhale 1 puff into the lungs every 6 (six) hours as needed (shortness of breath).     Marland Kitchen amoxicillin-clavulanate (AUGMENTIN) 875-125 MG tablet Take 1 tablet by mouth 2 (two)  times daily. 14 tablet 0  . aspirin 81 MG tablet Take 81 mg by mouth daily.     Marland Kitchen atorvastatin (LIPITOR) 80 MG tablet TAKE 1 TABLET BY MOUTH EVERY DAY (Patient taking differently: Take 80 mg by mouth daily. ) 90 tablet 1  . balsalazide (COLAZAL) 750 MG capsule TAKE 1 CAPSULE (750 MG TOTAL) BY MOUTH 3 (THREE) TIMES DAILY. (Patient taking differently: Take 750 mg by mouth 3 (three) times daily. ) 270 capsule 3  . dextromethorphan-guaiFENesin (MUCINEX DM) 30-600 MG 12hr tablet Take 1 tablet by mouth 2 (two) times daily.    Marland Kitchen dextromethorphan-guaiFENesin (ROBITUSSIN-DM) 10-100 MG/5ML liquid Take 10 mLs by mouth every 4 (four) hours as needed for cough.    . finasteride (PROSCAR) 5 MG tablet Take 1 tablet (5 mg total) by mouth daily. 30 tablet 0  . insulin aspart (NOVOLOG) 100 UNIT/ML injection Inject 5 Units into the skin 3 (three) times daily with meals. (Patient taking differently: Inject 5 Units into the skin 3 (three) times daily before meals. ) 10 mL 11  . insulin detemir (LEVEMIR) 100 UNIT/ML injection Inject 0.13 mLs (13 Units total) into the skin daily. 10 mL 11  . lactobacillus (FLORANEX/LACTINEX) PACK Take 1 packet (1 g total) by mouth 3 (three) times daily with meals. 90 each 0  . midodrine (PROAMATINE) 10 MG tablet Take 1 tablet (10 mg total) by mouth 3 (three) times daily. 30 tablet 0  . Multiple Vitamins-Minerals (MULTIVITAMIN WITH MINERALS) tablet Take 1 tablet by mouth daily.    . niacin (NIASPAN) 1000 MG CR tablet Take 1,000 mg by mouth daily.    . Nutritional Supplements (FEEDING SUPPLEMENT, NEPRO CARB STEADY,) LIQD Take 237 mLs by mouth 2 (two) times daily between meals. 14220 mL 0  . omeprazole (PRILOSEC) 20 MG capsule Take 20 mg by mouth daily.     . potassium chloride SA (K-DUR) 20 MEQ tablet Take 1 tablet (20 mEq total) by mouth daily. 30 tablet 3  . sodium hypochlorite (DAKIN'S 1/2 STRENGTH) external solution Irrigate with 1 application as directed 2 (two) times daily. (apply to  sacral wound)    . tamsulosin (FLOMAX) 0.4 MG CAPS capsule Take 1 capsule (0.4 mg total) by mouth daily after breakfast. 30 capsule 0  . vitamin C (ASCORBIC ACID) 500 MG tablet Take 500 mg by mouth 2 (two) times daily.    Alveda Reasons 20 MG TABS tablet TAKE 1 TABLET BY MOUTH EVERY DAY WITH LUNCH (Patient taking differently: Take 20 mg by mouth daily. ) 90 tablet 1  . zinc sulfate 220 (50 Zn) MG capsule Take 220 mg by mouth daily.     No current facility-administered medications on file prior to visit.      Family Hx:  The patient's family history includes Breast cancer in his mother; Heart attack in his father; Heart disease in his father. There is no history of Colon cancer.  ROS:   Please see the history of present illness.    Review of Systems  Constitutional: Negative.   HENT: Negative.   Respiratory: Positive for shortness of breath.   Cardiovascular: Positive for leg swelling.  Gastrointestinal: Negative.   Musculoskeletal: Negative.   Neurological: Negative.   Psychiatric/Behavioral: Negative.   All other systems reviewed and are negative.     Labs/Other Tests and Data Reviewed:    Recent Labs: 04/30/2019: ALT 15; B Natriuretic Peptide 879.0; BUN 20; Creatinine, Ser 0.97; Hemoglobin 9.6; Magnesium 1.8; Platelets 324; Potassium 5.1; Sodium 138   Recent Lipid Panel Lab Results  Component Value Date/Time   CHOL 112 05/09/2016 08:30 AM   TRIG 100 05/09/2016 08:30 AM   HDL 35 (L) 05/09/2016 08:30 AM   CHOLHDL 3.2 11/23/2013 02:00 AM   LDLCALC 57 05/09/2016 08:30 AM    Wt Readings from Last 3 Encounters:  04/30/19 270 lb (122.5 kg)  04/21/19 265 lb 6.9 oz (120.4 kg)  04/02/19 270 lb (122.5 kg)     Exam:    Vital Signs: Vital signs may also be detailed in the HPI There were no vitals taken for this visit.  Wt Readings from Last 3 Encounters:  04/30/19 270 lb (122.5 kg)  04/21/19 265 lb 6.9 oz (120.4 kg)  04/02/19 270 lb (122.5 kg)   Temp Readings from Last 3  Encounters:  04/30/19 97.6 F (36.4 C) (Axillary)  04/26/19 97.8 F (36.6 C) (Oral)  03/19/19 98.3 F (36.8 C) (Oral)   BP Readings from Last 3 Encounters:  05/01/19 116/89  04/26/19 (!) 89/80  03/19/19 112/75   Pulse Readings from Last 3 Encounters:  04/30/19 92  04/26/19 75  03/19/19 (!) 107     Well nourished, well developed male in no acute distress. Constitutional:  oriented to person, place, and time. No distress.    ASSESSMENT & PLAN:    Problem List Items Addressed This Visit      Cardiology Problems   Coronary artery disease of native artery of native heart with stable angina pectoris (HCC)   Atrial fibrillation ([place])   Cardiomyopathy, ischemic   Chronic systolic heart failure ([place]) - Primary   Atrial flutter ([place])     Other   Type II diabetes mellitus ([place])   ICD (implantable cardioverter-defibrillator) in place    Other Visit Diagnoses    Essential hypertension         Very complicated history over the past 3 months, long hospital course is for sepsis empyema respiratory distress renal failure anemia, protein calorie malnutrition, massive sacral wound [facility] at [facility] -Recommend we hold his Lasix, start torsemide 40 mg daily Decrease potassium down to 10 mEq given potassium is 5.4 Unclear what component is third spacing and what his heart failure Per nursing has massive leg swelling Per CT last week has bilateral pleural effusions contributing to shortness of breath -Lab work in 2 weeks  Still with delirium at nursing facility Now on IV antibiotics  Hypotension Exacerbated by third spacing, sepsis He is on midodrine 3 times daily Entresto held, carvedilol held in the past No room to restart his medications at this time until effects from infection resolves  Sacral decub Down to the bone, followed by wound clinic  COVID-19 Education: The signs and symptoms of COVID-19 were discussed with the patient  and how to seek care for testing  (follow up with PCP or arrange E-visit).  The importance of social distancing was discussed today.  Patient Risk:   After full review of this patients clinical status, I feel that they are at least moderate risk at this time.  Time:   Today, I have spent 45 minutes with the patient with telehealth technology discussing the cardiac and medical problems/diagnoses detailed above   Additional 10 min spent reviewing the chart prior to patient visit today Extensive review of notes, discussion with nursing   Medication Adjustments/Labs and Tests Ordered: Current medicines are reviewed at length with the patient today.  Concerns regarding medicines are outlined above.   Tests Ordered: Plans as above   Medication Changes: No changes made   Disposition: Follow-up in 1 month   Signed, Ida Rogue, MD  [place] Office [address] [place] [address], [place], [place] [address]

## 2019-05-08 ENCOUNTER — Telehealth (INDEPENDENT_AMBULATORY_CARE_PROVIDER_SITE_OTHER): Payer: Medicare Other | Admitting: Cardiovascular Disease

## 2019-05-08 ENCOUNTER — Other Ambulatory Visit: Payer: Self-pay

## 2019-05-08 ENCOUNTER — Telehealth: Payer: Self-pay | Admitting: Cardiovascular Disease

## 2019-05-08 DIAGNOSIS — I25118 Atherosclerotic heart disease of native coronary artery with other forms of angina pectoris: Secondary | ICD-10-CM

## 2019-05-08 DIAGNOSIS — I5022 Chronic systolic (congestive) heart failure: Secondary | ICD-10-CM

## 2019-05-08 DIAGNOSIS — I4892 Unspecified atrial flutter: Secondary | ICD-10-CM

## 2019-05-08 DIAGNOSIS — I10 Essential (primary) hypertension: Secondary | ICD-10-CM

## 2019-05-08 DIAGNOSIS — I255 Ischemic cardiomyopathy: Secondary | ICD-10-CM

## 2019-05-08 DIAGNOSIS — Z95810 Presence of automatic (implantable) cardiac defibrillator: Secondary | ICD-10-CM

## 2019-05-08 DIAGNOSIS — E1159 Type 2 diabetes mellitus with other circulatory complications: Secondary | ICD-10-CM

## 2019-05-08 DIAGNOSIS — I480 Paroxysmal atrial fibrillation: Secondary | ICD-10-CM

## 2019-05-08 MED ORDER — POTASSIUM CHLORIDE ER 10 MEQ PO TBCR: 10.0000 meq | EXTENDED_RELEASE_TABLET | Freq: Every day | ORAL | 3 refills | Status: DC

## 2019-05-08 MED ORDER — TORSEMIDE 20 MG PO TABS: ORAL_TABLET | ORAL | 3 refills | Status: DC

## 2019-05-08 NOTE — Patient Instructions (Addendum)
Medication Instructions:  - Your physician has recommended you make the following change in your medication:   1) Stop lasix (furosemide)  2) Start demadex (torsemide) 20 mg- take 2 tablets (40 mg) by mouth once daily  3) Decrease potassium to 10 meq- take 1 tablet (10 meq) by mouth once daily   Fax  [phone], KIM (nurse) Phone: [phone] (cell) of nurse [facility]    If you need a refill on your cardiac medications before your next appointment, please call your pharmacy.    Lab work: - Your physician recommends that you have lab work in 2 weeks: BMP - Fax results to Dr. Rockey Situ at [phone]   If you have labs (blood work) drawn today and your tests are completely normal, you will receive your results only by: Marland Kitchen MyChart Message (if you have MyChart) OR . A paper copy in the mail If you have any lab test that is abnormal or we need to change your treatment, we will call you to review the results.   Testing/Procedures: No new testing needed   Follow-Up: At [facility], you and your health needs are our priority.  As part of our continuing mission to provide you with exceptional heart care, we have created designated Provider Care Teams.  These Care Teams include your primary Cardiologist (physician) and Advanced Practice Providers (APPs -  Physician Assistants and Nurse Practitioners) who all work together to provide you with the care you need, when you need it.  . You will need a follow up appointment in 1 month virtual (scheduling will call to arrange)   . Providers on your designated Care Team:   . Murray Hodgkins, NP . Christell Faith, PA-C . Marrianne Mood, PA-C  Any Other Special Instructions Will Be Listed Below (If Applicable).  For educational health videos Log in to : [email or web address] Or : [email or web address], password : [email or web address]

## 2019-05-08 NOTE — Telephone Encounter (Signed)
A copy of the patient's AVS and written orders from today E-visit with Dr. Rockey Situ were faxed to Maudie Mercury at [facility]- [phone]. Confirmation received.  I did speak with Maudie Mercury as well and she states they will draw labs for him at [facility] in 2 weeks.

## 2019-05-09 ENCOUNTER — Other Ambulatory Visit: Payer: Self-pay

## 2019-05-09 ENCOUNTER — Encounter: Payer: Medicare Other | Attending: Physician Assistant | Admitting: Physician Assistant

## 2019-05-09 DIAGNOSIS — I252 Old myocardial infarction: Secondary | ICD-10-CM | POA: Insufficient documentation

## 2019-05-09 DIAGNOSIS — I509 Heart failure, unspecified: Secondary | ICD-10-CM | POA: Diagnosis not present

## 2019-05-09 DIAGNOSIS — Z8249 Family history of ischemic heart disease and other diseases of the circulatory system: Secondary | ICD-10-CM | POA: Insufficient documentation

## 2019-05-09 DIAGNOSIS — E11622 Type 2 diabetes mellitus with other skin ulcer: Secondary | ICD-10-CM | POA: Diagnosis present

## 2019-05-09 DIAGNOSIS — L89154 Pressure ulcer of sacral region, stage 4: Secondary | ICD-10-CM | POA: Insufficient documentation

## 2019-05-09 DIAGNOSIS — Z7901 Long term (current) use of anticoagulants: Secondary | ICD-10-CM | POA: Diagnosis not present

## 2019-05-09 DIAGNOSIS — Z950 Presence of cardiac pacemaker: Secondary | ICD-10-CM | POA: Diagnosis not present

## 2019-05-09 DIAGNOSIS — I110 Hypertensive heart disease with heart failure: Secondary | ICD-10-CM | POA: Insufficient documentation

## 2019-05-09 DIAGNOSIS — K5090 Crohn's disease, unspecified, without complications: Secondary | ICD-10-CM | POA: Diagnosis not present

## 2019-05-09 DIAGNOSIS — I255 Ischemic cardiomyopathy: Secondary | ICD-10-CM | POA: Diagnosis not present

## 2019-05-09 DIAGNOSIS — I2510 Atherosclerotic heart disease of native coronary artery without angina pectoris: Secondary | ICD-10-CM | POA: Insufficient documentation

## 2019-05-09 NOTE — Progress Notes (Signed)
Colton Lamb ([id number]) Visit Report for 05/09/2019 Abuse/Suicide Risk Screen Details Patient Name: Colton Lamb, TAY. Date of Service: 05/09/2019 9:45 AM Medical Record Number: [id number] Patient Account Number: [id number] Date of Birth/Sex: 02-19-1951 (68 y.o. M) Treating RN: Colton Lamb Primary Care Theone Bowell: Colton Lamb Other Clinician: Referring Colton Lamb: Colton Lamb, Colton Lamb Treating Hughie Melroy/Extender: Colton Lamb, Colton Lamb Colton Lamb: 0 Abuse/Suicide Risk Screen Items Answer ABUSE RISK SCREEN: Has anyone close to you tried to hurt or harm you recentlyo No Do you feel uncomfortable with anyone in your familyo No Has anyone forced you do things that you didnot want to doo No Electronic Signature(s) Signed: 05/09/2019 4:46:01 PM By: Colton Lamb Entered By: Colton Lamb on 05/09/2019 09:47:14 Colton Lamb ([id number]) -------------------------------------------------------------------------------- Activities of Daily Living Details Patient Name: Colton Lamb, GRAMLING. Date of Service: 05/09/2019 9:45 AM Medical Record Number: [id number] Patient Account Number: [id number] Date of Birth/Sex: 09-Sep-1950 (68 y.o. M) Treating RN: Colton Lamb Primary Care Rilla Buckman: Colton Lamb Other Clinician: Referring Evalise Abruzzese: Colton Lamb, Colton Lamb Treating Chrishana Spargur/Extender: Colton Lamb, Colton Lamb Colton Lamb: 0 Activities of Daily Living Items Answer Activities of Daily Living (Please select one for each item) Drive Automobile Not Able Take Medications Need Assistance Use Telephone Completely Able Care for Appearance Completely Able Use Toilet Need Assistance Bath / Shower Need Assistance Dress Self Need Assistance Feed Self Completely Able Walk Not Able Get In / Out Bed Need Assistance Housework Not Able Prepare Meals Not Able Handle Money Not Able Shop for Self Not Able Electronic Signature(s) Signed: 05/09/2019 4:46:01 PM By: Colton Lamb Entered By: Colton Lamb on 05/09/2019 09:47:52 Colton Lamb ([id number]) -------------------------------------------------------------------------------- Education Screening Details Patient Name: Colton Lamb Date of Service: 05/09/2019 9:45 AM Medical Record Number: [id number] Patient Account Number: [id number] Date of Birth/Sex: [birth year] (68 y.o. M) Treating RN: Colton Lamb Primary Care Tivon Lemoine: Colton Lamb Other Clinician: Referring Emmett Arntz: Colton Lamb, Colton Lamb Treating Avrom Robarts/Extender: Colton Lamb, Colton Lamb Colton Lamb: 0 Primary Learner Assessed: Patient Learning Preferences/Education Level/Primary Language Learning Preference: Explanation, Demonstration Highest Education Level: High School Preferred Language: English Cognitive Barrier Language Barrier: No Translator Needed: No Memory Deficit: No Emotional Barrier: No Cultural/Religious Beliefs Affecting Medical Care: No Physical Barrier Impaired Vision: No Impaired Hearing: No Decreased Hand dexterity: No Knowledge/Comprehension Knowledge Level: Medium Comprehension Level: Medium Ability to understand written Medium instructions: Ability to understand verbal Medium instructions: Motivation Anxiety Level: Calm Cooperation: Cooperative Education Importance: Acknowledges Need Interest in Health Problems: Asks Questions Perception: Coherent Willingness to Engage in Self- Medium Management Activities: Readiness to Engage in Self- Medium Management Activities: Electronic Signature(s) Signed: 05/09/2019 4:46:01 PM By: Colton Lamb Entered By: Colton Lamb on 05/09/2019 09:55:51 [name], [name] ([id number]) -------------------------------------------------------------------------------- Fall Risk Assessment Details Patient Name: Colton Lamb Date of Service: 05/09/2019 9:45 AM Medical Record Number: [id number] Patient Account Number: [id number] Date of Birth/Sex: 06-Jan-1951 (68 y.o. M) Treating RN:  Colton Lamb Primary Care Zabella Wease: Colton Lamb, Colton Lamb Other Clinician: Referring Eleah Lahaie: Colton Lamb, Colton Lamb Treating Jatavia Keltner/Extender: Colton Lamb, Colton Lamb Colton Lamb: 0 Fall Risk Assessment Items Have you had 2 or more falls in the last 12 monthso 0 Yes Have you had any fall that resulted in injury in the last 12 monthso 0 No FALLS RISK SCREEN History of falling - immediate or within 3 months 25 Yes Secondary diagnosis (Do you have 2 or more medical diagnoseso) 0 No Ambulatory aid None/bed rest/wheelchair/nurse 0 Yes Crutches/cane/walker 0 No Furniture 0 No Intravenous therapy Access/Saline/Heparin Lock 0 No Gait/Transferring Normal/ bed rest/ wheelchair 0 Yes Weak (  short steps with or without shuffle, stooped but able to lift head while 0 No walking, may seek support from furniture) Impaired (short steps with shuffle, may have difficulty arising from chair, head 0 No down, impaired balance) Mental Status Oriented to own ability 0 No Electronic Signature(s) Signed: 05/09/2019 4:46:01 PM By: Colton Lamb Entered By: Colton Lamb on 05/09/2019 09:56:56 Colton Lamb ([id number]) -------------------------------------------------------------------------------- Foot Assessment Details Patient Name: Colton Lamb. Date of Service: 05/09/2019 9:45 AM Medical Record Number: [id number] Patient Account Number: [id number] Date of Birth/Sex: 19-Aug-1950 (68 y.o. M) Treating RN: Colton Lamb Primary Care Benita Boonstra: Colton Lamb, Colton Lamb Other Clinician: Referring Jkayla Spiewak: Colton Lamb, Colton Lamb Treating Sherrika Weakland/Extender: Colton Lamb, Colton Lamb Colton Lamb: 0 Foot Assessment Items Site Locations + = Sensation present, - = Sensation absent, C = Callus, U = Ulcer R = Redness, W = Warmth, M = Maceration, PU = Pre-ulcerative lesion F = Fissure, S = Swelling, D = Dryness Assessment Right: Left: Other Deformity: No No Prior Foot Ulcer: No No Prior Amputation: No No Charcot Joint:  No No Ambulatory Status: Non-ambulatory Assistance Device: Wheelchair Gait: Electronic Signature(s) Signed: 05/09/2019 4:46:01 PM By: Colton Lamb Entered By: Colton Lamb on 05/09/2019 09:57:40 Colton Lamb ([id number]) -------------------------------------------------------------------------------- Nutrition Risk Screening Details Patient Name: Colton Lamb. Date of Service: 05/09/2019 9:45 AM Medical Record Number: [id number] Patient Account Number: [id number] Date of Birth/Sex: 08-Jan-1951 (68 y.o. M) Treating RN: Colton Lamb Primary Care Lipa Knauff: Colton Lamb, Colton Lamb Other Clinician: Referring Crandall Harvel: Colton Lamb, Colton Lamb Treating Kenita Bines/Extender: Colton Lamb, Colton Lamb Colton Lamb: 0 Height (in): 71 Weight (lbs): 285 Body Mass Index (BMI): 39.7 Nutrition Risk Screening Items Score Screening NUTRITION RISK SCREEN: I have an illness or condition that made me change the kind and/or amount of 0 No food I eat I eat fewer than two meals per day 0 No I eat few fruits and vegetables, or milk products 0 No I have three or more drinks of beer, liquor or wine almost every day 0 No I have tooth or mouth problems that make it hard for me to eat 0 No I don't always have enough money to buy the food I need 0 No I eat alone most of the time 0 No I take three or more different prescribed or over-the-counter drugs a day 1 Yes Without wanting to, I have lost or gained 10 pounds in the last six months 0 No I am not always physically able to shop, cook and/or feed myself 0 No Nutrition Protocols Good Risk Protocol Provide education on Moderate Risk Protocol 0 nutrition High Risk Proctocol Risk Level: Good Risk Score: 1 Electronic Signature(s) Signed: 05/09/2019 4:46:01 PM By: Colton Lamb Entered By: Colton Lamb on 05/09/2019 09:57:19

## 2019-05-10 ENCOUNTER — Other Ambulatory Visit
Admission: RE | Admit: 2019-05-10 | Discharge: 2019-05-10 | Disposition: A | Discharge status: Home / Self Care | Payer: Medicare Other | Source: Ambulatory Visit | Attending: Physician Assistant | Admitting: Physician Assistant

## 2019-05-10 DIAGNOSIS — B999 Unspecified infectious disease: Secondary | ICD-10-CM | POA: Diagnosis present

## 2019-05-12 LAB — AEROBIC CULTURE W GRAM STAIN (SUPERFICIAL SPECIMEN): Gram Stain: NONE SEEN

## 2019-05-14 ENCOUNTER — Ambulatory Visit: Payer: Medicare Other | Admitting: Family

## 2019-05-15 ENCOUNTER — Non-Acute Institutional Stay: Payer: Medicare Other | Admitting: Primary Care

## 2019-05-15 ENCOUNTER — Other Ambulatory Visit: Payer: Self-pay

## 2019-05-15 ENCOUNTER — Ambulatory Visit: Payer: Medicare Other | Admitting: Podiatry

## 2019-05-15 NOTE — Progress Notes (Deleted)
   Patient ID: Colton Lamb, male    DOB: January 18, 1951, 67 y.o.   MRN: [id number]  HPI  Mr Fortin is a 68 y/o male with a history of  Echo report from 02/08/2019 reviewed and showed an EF of 35-40%.  Was in the ED 04/30/2019 due to shortness of breath. COVID negative. Discharged with antibiotics back to SNF. Admitted 04/21/2019 due to septic shock and sacrum stage 4 pressure injury. Surgery, wound and palliative care consults obtained. Given IV antibiotics. Bedside debridement done of pressure ulcer. Discharged after 5 days.     Review of Systems    Physical Exam    Assessment & Plan:  1. Chronic heart failure with reduced ejection fraction- - NYHA class II - euvolemic today based on patient's description of symptoms - being weighed "almost daily" with a hoyer lift as he is currently in the quarantine unit at [facility]; instructed to call for an overnight weight gain of >2 pounds or a weekly weight gain of >5 pounds - not adding salt to his food - drinking water during the day - had telemedicine visit with cardiology Rockey Situ) 05/08/2019 - could consider adding entresto if renal function allows and/or titrating up metoprolol succinate - may also benefit from [place] - BNP 04/30/2019 was 879.0 - has ICD present  2: HTN- - seeing PCP at the facility right now - BMP from 04/30/2019 reviewed and showed sodium 138, potassium 5.1, creatinine 0.97 and GFR >60 - facility is going to check and see when patient sees nephrology ([place])  3: DM-  - facility says that his glucose this morning was  - A1c 03/05/2019 was 6.8% - facility says that patient 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

## 2019-05-16 ENCOUNTER — Non-Acute Institutional Stay: Payer: Medicare Other | Admitting: Primary Care

## 2019-05-16 ENCOUNTER — Other Ambulatory Visit: Payer: Self-pay

## 2019-05-17 ENCOUNTER — Ambulatory Visit: Payer: Medicare Other | Admitting: Family

## 2019-05-17 ENCOUNTER — Other Ambulatory Visit: Payer: Self-pay | Admitting: Cardiovascular Disease

## 2019-05-17 NOTE — Telephone Encounter (Signed)
Refill request for Xarelto

## 2019-05-17 NOTE — Telephone Encounter (Signed)
Xarelto 22m refill request received. Pt is 68years old, weight-122.5kg, Crea-0.97 on 04/30/2019, last seen by Dr. GRockey Situon 05/08/2019, Diagnosis-Afib, CrCl-126.242mmin; Dose is appropriate based on dosing criteria. Will send in refill to requested pharmacy.

## 2019-05-18 ENCOUNTER — Other Ambulatory Visit
Admission: RE | Admit: 2019-05-18 | Discharge: 2019-05-18 | Disposition: A | Discharge status: Home / Self Care | Payer: Medicare Other | Source: Ambulatory Visit | Attending: Family Medicine | Admitting: Family Medicine

## 2019-05-18 ENCOUNTER — Other Ambulatory Visit: Payer: Self-pay

## 2019-05-18 ENCOUNTER — Encounter: Payer: Self-pay | Admitting: *Deleted

## 2019-05-18 ENCOUNTER — Emergency Department: Payer: Medicare Other

## 2019-05-18 ENCOUNTER — Emergency Department
Admission: EM | Admit: 2019-05-18 | Discharge: 2019-05-18 | Disposition: A | Discharge status: Home / Self Care | Payer: Medicare Other | Attending: Student in an Organized Health Care Education/Training Program | Admitting: Student in an Organized Health Care Education/Training Program

## 2019-05-18 DIAGNOSIS — Z79899 Other long term (current) drug therapy: Secondary | ICD-10-CM | POA: Insufficient documentation

## 2019-05-18 DIAGNOSIS — E119 Type 2 diabetes mellitus without complications: Secondary | ICD-10-CM | POA: Insufficient documentation

## 2019-05-18 DIAGNOSIS — R0602 Shortness of breath: Secondary | ICD-10-CM | POA: Diagnosis present

## 2019-05-18 DIAGNOSIS — Z20828 Contact with and (suspected) exposure to other viral communicable diseases: Secondary | ICD-10-CM | POA: Diagnosis not present

## 2019-05-18 DIAGNOSIS — I110 Hypertensive heart disease with heart failure: Secondary | ICD-10-CM | POA: Insufficient documentation

## 2019-05-18 DIAGNOSIS — Z794 Long term (current) use of insulin: Secondary | ICD-10-CM | POA: Insufficient documentation

## 2019-05-18 DIAGNOSIS — Z7901 Long term (current) use of anticoagulants: Secondary | ICD-10-CM | POA: Diagnosis not present

## 2019-05-18 DIAGNOSIS — I4891 Unspecified atrial fibrillation: Secondary | ICD-10-CM | POA: Diagnosis not present

## 2019-05-18 DIAGNOSIS — Z7982 Long term (current) use of aspirin: Secondary | ICD-10-CM | POA: Diagnosis not present

## 2019-05-18 DIAGNOSIS — I5022 Chronic systolic (congestive) heart failure: Secondary | ICD-10-CM | POA: Diagnosis not present

## 2019-05-18 LAB — CBC WITH DIFFERENTIAL/PLATELET
Abs Immature Granulocytes: 0.01 10*3/uL (ref 0.00–0.07)
Basophils Absolute: 0.1 10*3/uL (ref 0.0–0.1)
Basophils Relative: 1 %
Eosinophils Absolute: 0.3 10*3/uL (ref 0.0–0.5)
Eosinophils Relative: 4 %
HCT: 33.5 % — ABNORMAL LOW (ref 39.0–52.0)
Hemoglobin: 10 g/dL — ABNORMAL LOW (ref 13.0–17.0)
Immature Granulocytes: 0 %
Lymphocytes Relative: 19 %
Lymphs Abs: 1.2 10*3/uL (ref 0.7–4.0)
MCH: 26.8 pg (ref 26.0–34.0)
MCHC: 29.9 g/dL — ABNORMAL LOW (ref 30.0–36.0)
MCV: 89.8 fL (ref 80.0–100.0)
Monocytes Absolute: 0.4 10*3/uL (ref 0.1–1.0)
Monocytes Relative: 7 %
Neutro Abs: 4.4 10*3/uL (ref 1.7–7.7)
Neutrophils Relative %: 69 %
Platelets: 241 10*3/uL (ref 150–400)
RBC: 3.73 MIL/uL — ABNORMAL LOW (ref 4.22–5.81)
RDW: 21.1 % — ABNORMAL HIGH (ref 11.5–15.5)
WBC: 6.4 10*3/uL (ref 4.0–10.5)
nRBC: 0 % (ref 0.0–0.2)

## 2019-05-18 LAB — COMPREHENSIVE METABOLIC PANEL
ALT: 13 U/L (ref 0–44)
AST: 19 U/L (ref 15–41)
Albumin: 2.7 g/dL — ABNORMAL LOW (ref 3.5–5.0)
Alkaline Phosphatase: 140 U/L — ABNORMAL HIGH (ref 38–126)
Anion gap: 14 (ref 5–15)
BUN: 24 mg/dL — ABNORMAL HIGH (ref 8–23)
CO2: 33 mmol/L — ABNORMAL HIGH (ref 22–32)
Calcium: 8.6 mg/dL — ABNORMAL LOW (ref 8.9–10.3)
Chloride: 99 mmol/L (ref 98–111)
Creatinine, Ser: 0.99 mg/dL (ref 0.61–1.24)
GFR calc Af Amer: >60 mL/min (ref >60)
GFR calc non Af Amer: >60 mL/min (ref >60)
Glucose, Bld: 80 mg/dL (ref 70–99)
Potassium: 3.3 mmol/L — ABNORMAL LOW (ref 3.5–5.1)
Sodium: 146 mmol/L — ABNORMAL HIGH (ref 135–145)
Total Bilirubin: 0.7 mg/dL (ref 0.3–1.2)
Total Protein: 6.7 g/dL (ref 6.5–8.1)

## 2019-05-18 LAB — SARS CORONAVIRUS 2 BY RT PCR (HOSPITAL ORDER, PERFORMED IN ~~LOC~~ HOSPITAL LAB): SARS Coronavirus 2: NEGATIVE

## 2019-05-18 LAB — BRAIN NATRIURETIC PEPTIDE: B Natriuretic Peptide: 721 pg/mL — ABNORMAL HIGH (ref 0.0–100.0)

## 2019-05-18 LAB — TROPONIN I (HIGH SENSITIVITY)
Troponin I (High Sensitivity): 16 ng/L (ref <18)
Troponin I (High Sensitivity): 15 ng/L (ref <18)

## 2019-05-18 LAB — LACTIC ACID, PLASMA: Lactic Acid, Venous: 1.1 mmol/L (ref 0.5–1.9)

## 2019-05-18 MED ORDER — FUROSEMIDE 10 MG/ML IJ SOLN
40.0000 mg | Freq: Once | INTRAMUSCULAR | Status: AC
  Administered 2019-05-18: 40 mg via INTRAVENOUS
  Filled 2019-05-18: qty 4

## 2019-05-18 MED ORDER — FUROSEMIDE 10 MG/ML IJ SOLN: 60.0000 mg | Freq: Once | INTRAMUSCULAR | Status: DC

## 2019-05-18 NOTE — ED Notes (Signed)
Patient's sister [name] called for an update. Patient gave verbal permission to speak with his sister.

## 2019-05-18 NOTE — ED Notes (Signed)
Patient placed on 0.5L O2 per patient's request for comfort. Patient is tachypneic at 28-32 RR. Roxy [name] PA-C aware.

## 2019-05-18 NOTE — ED Notes (Signed)
EMS at bedside

## 2019-05-18 NOTE — ED Provider Notes (Signed)
[facility] Emergency Department Provider Note  ____________________________________________  Time seen: Approximately 3:13 PM  I have reviewed the triage vital signs and the nursing notes.   HISTORY  Chief Complaint Shortness of Breath    HPI Colton Lamb is a 68 y.o. male who presents the emergency department via EMS for complaint of firing defibrillator.  According to EMS, they were called for altered mental status.  Patient was alert and oriented with EMS the entire time.  Patient been complaining of shortness of breath and the firing of his defibrillator.  On arrival, patient is alert, oriented able to provide his own medical history.  Patient has been admitted twice in the past several months for respiratory issues including sepsis.  Patient does have a history of a flutter, CHF, coronary artery disease, GERD, hypertension, cardiomyopathy, paroxysmal A. fib, sleep apnea.  Patient reports that he does feel short of breath but has had no fevers or chills, coughing.  Patient is concerned today as he has felt his defibrillator firing multiple times.  Patient is on able to quantify how many times he is felt the defibrillator.  Patient denies any headache, nasal congestion, cough, abdominal pain, nausea vomiting, diarrhea or constipation.  Patient denies any chest pain at this time as well.        Past Medical History:  Diagnosis Date  . Atrial flutter ([place])    a. s/p Cardioversion 11/22/13, on amiodarone and Xarelto.  . Chronic systolic heart failure ([place])    a. 10/2013 EF 20-25%, grade III DD, RV mildly dilated and sys fx mild/mod reduced;  b. 01/2014 Echo: EF 30-35%, gr3 DD, mod dil LA.  Marland Kitchen Coronary artery disease    a. s/p MI 2007/2015;  b. s/p prior PCI to the LAD/LCX/PDA/PL;  c. 2008: s/p Cypher DES to the OM.  Marland Kitchen Crohn's ileocolitis ([place])   . GERD (gastroesophageal reflux disease)   . Hx of adenomatous colonic polyps 11/2003  . Hyperlipidemia   .  Hypertension   . Ischemic cardiomyopathy    s. 01/2014 s/p MDT [id number] [name] XT DR single lead AICD.  Marland Kitchen Obesity   . Paroxysmal atrial fibrillation (HCC)    a. CHA2DS2VASc = 4-->xarelto/amio.  . Sleep apnea   . Syncope    a.  11/2013 in setting of volume depletion and bradycardia due to dig toxicity   . Type II diabetes mellitus [facility])     Patient Active Problem List   Diagnosis Date Noted  . Acute blood loss anemia   . Pressure injury of skin 03/17/2019  . Acute renal failure (ARF) ([place])   . Empyema lung ([place])   . Shortness of breath   . Pleural effusion on right   . Sepsis ([place]) 03/04/2019  . Restless legs syndrome (RLS) 06/16/2017  . Hypertension, essential 06/16/2017  . Coronary artery disease of native artery of native heart with stable angina pectoris ([place]) 04/27/2017  . Hydrocephalus ([place]) 09/08/2016  . Dizziness and giddiness   . Elevated troponin 09/06/2016  . Type II diabetes mellitus ([place])   . Ischemic cardiomyopathy   . Hyponatremia 07/01/2015  . Diabetes mellitus type 2, uncontrolled, with complications ([place]) 61/44/3154  . 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 OSA (obstructive sleep apnea) 12/20/2013  . Morbid obesity ([place]) 12/20/2013  . AKI (acute kidney injury) - Creatinine improved at d/c 12/14/2013  . Elevated TSH - will need f/u TFTs with PCP in 3-4 weeks 12/14/2013  . Junctional bradycardia - resolved 12/14/2013  .  Syncope - due to bradycardia in setting of Digoxin Toxicity 12/11/2013  . Chronic systolic heart failure ([place]) 12/06/2013  . At risk for sudden cardiac death - on LifeVest Dec 06, 2013  . Acute combined systolic and diastolic CHF, NYHA class 3 -- 2/2 MI Dec 06, 2013  . Cardiomyopathy, ischemic 12/06/2013  . Atrial flutter ([place]) 11/22/2013  . NSTEMI (non-ST elevated myocardial infarction) ([place]) 11/22/2013  . Long term current use of anticoagulant 10/19/2010  . Hyperlipidemia 05/06/2010  . Edema 05/06/2010  . CAD S/P  percutaneous coronary angioplasty - multiple PCIs 11/11/2008  . Atrial fibrillation ([place]) 11/11/2008  . GERD 11/11/2008  . [place] INTESTINE 11/11/2008  . COLONIC POLYPS, HX OF 11/11/2008  . [place] ALLERGY 11/11/2008    Past Surgical History:  Procedure Laterality Date  . ATRIAL FLUTTER ABLATION N/A 04/16/2014   Procedure: ATRIAL FLUTTER ABLATION;  Surgeon: Evans Lance, MD;  Location: [facility] CATH LAB;  Service: Cardiovascular;  Laterality: N/A;  . CARDIAC CATHETERIZATION  10/2013  . CARDIAC DEFIBRILLATOR PLACEMENT  04/16/2014   Medtronic Evira device  . CARDIAC ELECTROPHYSIOLOGY STUDY AND ABLATION  04/16/2014   atrial flutter ablation  . CARDIOVERSION N/A 03/05/2014   Procedure: CARDIOVERSION;  Surgeon: Jolaine Artist, MD;  Location[facility] ENDOSCOPY;  Service: Cardiovascular;  Laterality: N/A;  . CATARACT EXTRACTION W/PHACO Right 01/04/2017   Procedure: CATARACT EXTRACTION PHACO AND INTRAOCULAR LENS PLACEMENT ([place])  Right Diabetic Complicated;  Surgeon: Leandrew Koyanagi, MD;  Location: [place];  Service: Ophthalmology;  Laterality: Right;  Diabetic  . CATARACT EXTRACTION W/PHACO Left 02/08/2017   Procedure: CATARACT EXTRACTION PHACO AND INTRAOCULAR LENS PLACEMENT ([place]) left diabetic;  Surgeon: Leandrew Koyanagi, MD;  Location: [place];  Service: Ophthalmology;  Laterality: Left;  Diabetic - oral meds sleep apnea  . CORONARY ANGIOPLASTY WITH STENT PLACEMENT  2007; 2008 X 2   "1+1 ~ 1"  . FOOT SURGERY Left    bone spur  . HYDROCELE EXCISION Bilateral   . 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 IMPLANTABLE CARDIOVERTER DEFIBRILLATOR IMPLANT N/A 04/16/2014   Procedure: IMPLANTABLE CARDIOVERTER DEFIBRILLATOR IMPLANT;  Surgeon: Evans Lance, MD;  Location: [facility] C[facility] LAB;  Service: Cardiovascular;  Laterality: N/A;  . LEFT HEART CATHETERIZATION WITH CORONARY ANGIOGRAM N/A 11/22/2013   Procedure: LEFT HEART  CATHETERIZATION WITH CORONARY ANGIOGRAM;  Surgeon: Sinclair Grooms, MD;  Location: [facility] CATH LAB;  Service: Cardiovascular;  Laterality: N/A;    Prior to Admission medications   Medication Sig Start Date End Date Taking? Authorizing Provider  acetaminophen (TYLENOL) 325 MG tablet Take 1-2 tablets (325-650 mg total) by mouth every 4 (four) hours as needed for mild pain. 04/17/14   [name], NP  Albuterol Sulfate 108 (90 Base) MCG/ACT AEPB Inhale 1 puff into the lungs every 6 (six) hours as needed (shortness of breath).     [provider]  amoxicillin-clavulanate (AUGMENTIN) 875-125 MG tablet Take 1 tablet by mouth 2 (two) times daily. 05/01/19   Paulette Blanch, MD  aspirin 81 MG tablet Take 81 mg by mouth daily.     [provider]  atorvastatin (LIPITOR) 80 MG tablet TAKE 1 TABLET BY MOUTH EVERY DAY Patient taking differently: Take 80 mg by mouth daily.  03/08/19   Minna Merritts, MD  balsalazide (COLAZAL) 750 MG capsule TAKE 1 CAPSULE (750 MG TOTAL) BY MOUTH 3 (THREE) TIMES DAILY. Patient taking differently: Take 750 mg by mouth 3 (three) times daily.  11/28/18  Ladene Artist, MD  dextromethorphan-guaiFENesin [facility] DM) 30-600 MG 12hr tablet Take 1 tablet by mouth 2 (two) times daily.    [provider]  dextromethorphan-guaiFENesin (ROBITUSSIN-DM) 10-100 MG/5ML liquid Take 10 mLs by mouth every 4 (four) hours as needed for cough.    [provider]  finasteride (PROSCAR) 5 MG tablet Take 1 tablet (5 mg total) by mouth daily. 03/20/19   Loletha Grayer, MD  insulin aspart (NOVOLOG) 100 UNIT/ML injection Inject 5 Units into the skin 3 (three) times daily with meals. Patient taking differently: Inject 5 Units into the skin 3 (three) times daily before meals.  03/19/19   Loletha Grayer, MD  insulin detemir (LEVEMIR) 100 UNIT/ML injection Inject 0.13 mLs (13 Units total) into the skin daily. 03/19/19   Loletha Grayer, MD  lactobacillus  (FLORANEX/LACTINEX) PACK Take 1 packet (1 g total) by mouth 3 (three) times daily with meals. 03/19/19   Loletha Grayer, MD  midodrine (PROAMATINE) 10 MG tablet Take 1 tablet (10 mg total) by mouth 3 (three) times daily. 04/26/19   Max Sane, MD  Multiple Vitamins-Minerals (MULTIVITAMIN WITH MINERALS) tablet Take 1 tablet by mouth daily.    [provider]  niacin (NIASPAN) 1000 MG CR tablet Take 1,000 mg by mouth daily. 04/10/19   [provider]  Nutritional Supplements (FEEDING SUPPLEMENT, NEPRO CARB STEADY,) LIQD Take 237 mLs by mouth 2 (two) times daily between meals. 03/19/19   Loletha Grayer, MD  omeprazole (PRILOSEC) 20 MG capsule Take 20 mg by mouth daily.     [provider]  potassium chloride (KLOR-CON) 10 MEQ tablet Take 1 tablet (10 mEq total) by mouth daily. 05/08/19 08/06/19  Minna Merritts, MD  sodium hypochlorite (DAKIN'S 1/2 STRENGTH) external solution Irrigate with 1 application as directed 2 (two) times daily. (apply to sacral wound)    [provider]  tamsulosin (FLOMAX) 0.4 MG CAPS capsule Take 1 capsule (0.4 mg total) by mouth daily after breakfast. 03/20/19   Loletha Grayer, MD  torsemide (DEMADEX) 20 MG tablet Take 2 tablets (40 mg) by mouth once daily 05/08/19   Minna Merritts, MD  vitamin C (ASCORBIC ACID) 500 MG tablet Take 500 mg by mouth 2 (two) times daily.    [provider]  XARELTO 20 MG TABS tablet TAKE 1 TABLET BY MOUTH EVERY DAY WITH LUNCH 05/17/19   Gollan, Kathlene November, MD  zinc sulfate 220 (50 Zn) MG capsule Take 220 mg by mouth daily.    [provider]    Allergies Iodine, Shrimp [shellfish allergy], and Tetracycline  Family History  Problem Relation Age of Onset  . Breast cancer Mother   . Heart disease Father   . Heart attack Father   . Colon cancer Neg Hx     Social History Social History   Tobacco Use  . Smoking status: Never Smoker  . Smokeless tobacco: Never Used  Substance Use  Topics  . Alcohol use: No  . Drug use: No     Review of Systems  Constitutional: No fever/chills Eyes: No visual changes. No discharge ENT: No upper respiratory complaints. Cardiovascular: no chest pain.  Reports pacemaker firing. Respiratory: no cough.  Positive SOB. Gastrointestinal: No abdominal pain.  No nausea, no vomiting.  No diarrhea.  No constipation. Genitourinary: Negative for dysuria. No hematuria Musculoskeletal: Negative for musculoskeletal pain. Skin: Negative for rash, abrasions, lacerations, ecchymosis. Neurological: Negative for headaches, focal weakness or numbness. 10-point ROS otherwise negative.  ____________________________________________   PHYSICAL EXAM:  VITAL SIGNS: ED Triage Vitals  Enc Vitals Group     BP 05/18/19 1506 100/70     Pulse Rate 05/18/19 1506 78     Resp 05/18/19 1506 (!) 28     Temp 05/18/19 1506 97.6 F (36.4 C)     Temp Source 05/18/19 1506 Oral     SpO2 05/18/19 1506 99 %     Weight 05/18/19 1509 270 lb (122.5 kg)     Height 05/18/19 1509 5' 11"  (1.803 m)     Head Circumference --      [facility] --      Pain Score 05/18/19 1508 0     Pain Loc --      Pain Edu? --      Excl. in [place]? --      Constitutional: Alert and oriented. Well appearing and in no acute distress. Eyes: Conjunctivae are normal. PERRL. EOMI. Head: Atraumatic. ENT:      Ears:       Nose: No congestion/rhinnorhea.      Mouth/Throat: Mucous membranes are moist.  Neck: No stridor.  No cervical spine tenderness to palpation Hematological/Lymphatic/Immunilogical: No cervical lymphadenopathy. Cardiovascular: Normal rate, regular rhythm. Normal S1 and S2.  Good peripheral circulation. Respiratory: Increased respiratory effort with tachypnea, belly breathing, no retractions. Lungs with some crackles, a few scattered expiratory wheezes.  Mildly decreased air entry to the bases with no decreased or absent breath sounds. Gastrointestinal: Use of abdominal muscles  to assist with breathing.  Bowel sounds 4 quadrants. Soft and nontender to palpation. No guarding or rigidity. No palpable masses. No distention. No CVA tenderness. Musculoskeletal: Full range of motion to all extremities. No gross deformities appreciated. Neurologic:  Normal speech and language. No gross focal neurologic deficits are appreciated.  Skin:  Skin is warm, dry and intact. No rash noted. Psychiatric: Mood and affect are normal. Speech and behavior are normal. Patient exhibits appropriate insight and judgement.  Patient is alert and oriented x4.  Patient understand what is going on, able to answer questions appropriately.  Patient has good recall of his medical history over the past several months.  Patient does endorse intermittently having difficulty remembering things, and reports that sometimes he believes he is having delusions.  Patient is not suicidal or homicidal.  Patient is not having any delusions at this time.  Patient again is able to answer all questions appropriately and appears competent to make his own medical decisions.   ____________________________________________   LABS (all labs ordered are listed, but only abnormal results are displayed)  Labs Reviewed  CBC WITH DIFFERENTIAL/PLATELET - Abnormal; Notable for the following components:      Result Value   RBC 3.73 (*)    Hemoglobin 10.0 (*)    HCT 33.5 (*)    MCHC 29.9 (*)    RDW 21.1 (*)    All other components within normal limits  COMPREHENSIVE METABOLIC PANEL - Abnormal; Notable for the following components:   Sodium 146 (*)    Potassium 3.3 (*)    CO2 33 (*)    BUN 24 (*)    Calcium 8.6 (*)    Albumin 2.7 (*)    Alkaline Phosphatase 140 (*)    All other components within normal limits  BRAIN NATRIURETIC PEPTIDE - Abnormal; Notable for the following components:   B Natriuretic Peptide 721.0 (*)    All other components within normal limits  LACTIC ACID, PLASMA  LACTIC ACID, PLASMA  TROPONIN I  (HIGH SENSITIVITY)  TROPONIN I (HIGH SENSITIVITY)   ____________________________________________  EKG   ____________________________________________  RADIOLOGY I personally viewed and evaluated these images as part of my medical decision making, as well as reviewing the written report by the radiologist.  Dg Chest Portable 1 View  Result Date: 05/18/2019 CLINICAL DATA:  68 year old male with shortness of breath and altered mental status. EXAM: PORTABLE CHEST 1 VIEW COMPARISON:  Chest radiograph dated 04/30/2019 FINDINGS: There is moderate cardiomegaly with vascular congestion. Small bilateral pleural effusions and bibasilar atelectasis. Pneumonia is not excluded. Clinical correlation is recommended. Overall similar appearance of the cardiomediastinal silhouette and vascular congestion and edema to the radiograph of 04/30/2019. Right upper lobe opacity, similar to prior radiograph and likely combination of pleural effusion and associated atelectasis. There is no pneumothorax. Atherosclerotic calcification of the aortic arch. Left pectoral AICD device. No acute osseous pathology. IMPRESSION: Cardiomegaly with vascular congestion and bilateral pleural effusions overall similar to the radiograph of 04/30/2019. Electronically Signed   By: Anner Crete M.D.   On: 05/18/2019 15:58    ____________________________________________    PROCEDURES  Procedure(s) performed:    Procedures        Medications  furosemide (LASIX) injection 40 mg (40 mg Intravenous Given 05/18/19 1906)     ____________________________________________   INITIAL IMPRESSION / ASSESSMENT AND PLAN / ED COURSE  Pertinent labs & imaging results that were available during my care of the patient were reviewed by me and considered in my medical decision making (see chart for details).  Review of the [place] CSRS was performed in accordance of the [place] prior to dispensing any controlled drugs.            Patient's diagnosis is consistent with shortness of breath.  Patient presented to the emergency department for concerns that he believes his defibrillator was activating as well as shortness of breath.  Patient has chronic shortness of breath, chronic tachypnea and is on oxygen at [facility].  Patient was maintaining 95% saturation on room air with tachypnea.  Patient improved to 100% oxygen saturation with 1 L and then subsequently 0.5 L a minute via nasal cannula.  Patient has had ongoing chronic tachypnea after his recent admissions.  Patient had tachypnea at the same rate in the upper 20s at his previous visit as well.  Patient denies any change in his shortness of breath.  Patient is mostly concerned because he believes his defibrillator was firing.  Defibrillator was queried here in the emergency department and there is no evidence of any defibrillation.  According to the query, patient has been in chronic A. fib for the past 3 months, the fluid monitor is at the "top of the scale."  This is also chronic.  There was no reported ventricular arrhythmia.  No firing of the defibrillator.  No evidence of infectious process on exam, labs, imaging.  It appears that patient is chronically short of breath, slightly fluid overloaded.  Patient was given IV Lasix here in the emergency department.  I discussed patient's work-up and the fact that his defibrillator had not activated.  Patient is reassured at this time and states that he is ready to be discharged.  I feel that this patient is stable at this time for discharge.  I have given strict return precautions which patient verbalized understanding of same. Patient is given ED precautions to return to the ED for any worsening or new symptoms.     ____________________________________________  FINAL CLINICAL IMPRESSION(S) / ED DIAGNOSES  Final diagnoses:  Shortness of breath  Chronic systolic congestive heart failure ([place])      NEW MEDICATIONS  STARTED DURING THIS VISIT:  ED Discharge Orders    None          This chart was dictated using voice recognition software/Dragon. Despite best efforts to proofread, errors can occur which can change the meaning. Any change was purely unintentional.    Darletta Moll, PA-C 05/18/19 2026    Merlyn Lot, MD 05/18/19 2218

## 2019-05-18 NOTE — ED Notes (Signed)
ED Provider Quentin Cornwall at bedside.

## 2019-05-18 NOTE — ED Triage Notes (Signed)
Per EMS report, patient is from [facility]. Original call-out was for altered mental status. Per EMS, patient was A&Ox4. Patient c/o shortness of breath and repeated firing of defibrillator.

## 2019-05-20 ENCOUNTER — Non-Acute Institutional Stay: Payer: Medicare Other | Admitting: Primary Care

## 2019-05-20 ENCOUNTER — Other Ambulatory Visit: Payer: Self-pay

## 2019-05-20 DIAGNOSIS — Z515 Encounter for palliative care: Secondary | ICD-10-CM

## 2019-05-20 NOTE — Progress Notes (Signed)
TYELER, GOEDKEN ([id number]) Visit Report for 05/09/2019 Allergy List Details Patient Name: Colton Lamb, Colton Lamb. Date of Service: 05/09/2019 9:45 AM Medical Record Number: [id number] Patient Account Number: [id number] Date of Birth/Sex: [birth year] (68 y.o. M) Treating RN: Montey Hora Primary Care Jaze Rodino: [name] Other Clinician: Referring Kailo Kosik: Lovie Macadamia, DAVID Treating Josslyn Ciolek/Extender: STONE III, HOYT Weeks in Treatment: 0 Allergies Active Allergies Shellfish Containing Products shrimp tetracycline HCl Allergy Notes Electronic Signature(s) Signed: 05/09/2019 4:46:01 PM By: Montey Hora Entered By: Montey Hora on 05/09/2019 09:39:28 Colton Lamb ([id number]) -------------------------------------------------------------------------------- Arrival Information Details Patient Name: Colton Lamb, Colton Lamb. Date of Service: 05/09/2019 9:45 AM Medical Record Number: [id number] Patient Account Number: [id number] Date of Birth/Sex: 06/26/1951 (68 y.o. M) Treating RN: Montey Hora Primary Care Mallika Sanmiguel: [name] Other Clinician: Referring Felishia Wartman: Lovie Macadamia, DAVID Treating Charman Blasco/Extender: Melburn Hake, HOYT Weeks in Treatment: 0 Visit Information Patient Arrived: Wheel Chair Arrival Time: 09:33 Accompanied By: caregiver Transfer Assistance: Harrel Lemon Lift Patient Identification Verified: Yes Secondary Verification Process Completed: Yes Patient Has Alerts: Yes Patient Alerts: DMII Xarelto 70m aspirin Electronic Signature(s) Signed: 05/09/2019 4:46:01 PM By: DMontey HoraEntered By: DMontey Horaon 05/09/2019 09:58:05 BJob Lamb([id number] -------------------------------------------------------------------------------- Clinic Level of Care Assessment Details Patient Name: BJob FoundsDate of Service: 05/09/2019 9:45 AM Medical Record Number: [id number] Account Number: [id number] of Birth/Sex: [birth year](68 y.o.  M) Treating RN: SArmy MeliaPrimary Care Morine Kohlman: BJuluis PitchOther Clinician: Referring Lyndon Chapel: BLovie Macadamia DAVID Treating Ladawna Walgren/Extender: SMelburn Hake HOYT Weeks in Treatment: 0 Clinic Level of Care Assessment Items TOOL 1 Quantity Score []  - Use when EandM and Procedure is performed on INITIAL visit 0 ASSESSMENTS - Nursing Assessment / Reassessment X - General Physical Exam (combine w/ comprehensive assessment (listed just below) when 1 20 performed on new pt. evals) X- 1 25 Comprehensive Assessment (HX, ROS, Risk Assessments, Wounds Hx, etc.) ASSESSMENTS - Wound and Skin Assessment / Reassessment []  - Dermatologic / Skin Assessment (not related to wound area) 0 ASSESSMENTS - Ostomy and/or Continence Assessment and Care []  - Incontinence Assessment and Management 0 []  - 0 Ostomy Care Assessment and Management (repouching, etc.) PROCESS - Coordination of Care X - Simple Patient / Family Education for ongoing care 1 15 []  - 0 Complex (extensive) Patient / Family Education for ongoing care X- 1 10 Staff obtains CProgrammer, systems Records, Test Results / Process Orders X- 1 10 Staff telephones HHA, Nursing Homes / Clarify orders / etc []  - 0 Routine Transfer to another Facility (non-emergent condition) []  - 0 Routine Hospital Admission (non-emergent condition) X- 1 15 New Admissions / IBiomedical engineer/ Ordering NPWT, Apligraf, etc. []  - 0 Emergency Hospital Admission (emergent condition) PROCESS - Special Needs []  - Pediatric / Minor Patient Management 0 []  - 0 Isolation Patient Management []  - 0 Hearing / Language / Visual special needs []  - 0 Assessment of Community assistance (transportation, D/C planning, etc.) X- 1 15 Additional assistance / Altered mentation []  - 0 Support Surface(s) Assessment (bed, cushion, seat, etc.) BDECODA, VAN([id number] INTERVENTIONS - Miscellaneous []  - External ear exam 0 []  - 0 Patient Transfer (multiple staff /  HCivil Service fast streamer/ Similar devices) []  - 0 Simple Staple / Suture removal (25 or less) []  - 0 Complex Staple / Suture removal (26 or more) []  - 0 Hypo/Hyperglycemic Management (do not check if billed separately) []  - 0 Ankle / Brachial Index (ABI) - do not check if billed separately Has the patient been seen at the hospital within the  last three years: Yes Total Score: 110 Level Of Care: New/Established - Level 3 Electronic Signature(s) Signed: 05/20/2019 8:11:06 AM By: Army Melia Entered By: Army Melia on 05/09/2019 10:33:22 Colton Lamb ([id number]) -------------------------------------------------------------------------------- Encounter Discharge Information Details Patient Name: Colton Lamb, Colton Lamb. Date of Service: 05/09/2019 9:45 AM Medical Record Number: [id number] Patient Account Number: [id number] Date of Birth/Sex: [birth year] (68 y.o. M) Treating RN: Army Melia Primary Care Scarlet Abad: [name] Other Clinician: Referring Tabita Corbo: Lovie Macadamia, DAVID Treating Bowman Higbie/Extender: Melburn Hake, HOYT Weeks in Treatment: 0 Encounter Discharge Information Items Post Procedure Vitals Discharge Condition: Stable Temperature (F): 97.7 Ambulatory Status: Wheelchair Pulse (bpm): 100 Discharge Destination: [place] Respiratory Rate (breaths/min): 16 Orders Sent: Yes Blood Pressure (mmHg): 114/71 Transportation: Private Auto Accompanied By: caregiver Schedule Follow-up Appointment: Yes Clinical Summary of Care: Electronic Signature(s) Signed: 05/20/2019 8:11:06 AM By: Army Melia Entered By: Army Melia on 05/09/2019 10:34:22 Colton Lamb ([id number]) -------------------------------------------------------------------------------- Lower Extremity Assessment Details Patient Name: Colton Lamb. Date of Service: 05/09/2019 9:45 AM Medical Record Number: [id number] Patient Account Number: [id number] Date of Birth/Sex: November 11, 1950 (68 y.o.  M) Treating RN: Montey Hora Primary Care Hayk Divis: [name] Other Clinician: Referring [place]: Lovie Macadamia, DAVID Treating Monta Maiorana/Extender: Melburn Hake, HOYT Weeks in Treatment: 0 Electronic Signature(s) Signed: 05/09/2019 4:46:01 PM By: Montey Hora Entered By: Montey Hora on 05/09/2019 10:03:33 Colton Lamb ([id number]) -------------------------------------------------------------------------------- Multi Wound Chart Details Patient Name: Colton Lamb, Colton Lamb. Date of Service: 05/09/2019 9:45 AM Medical Record Number: [id number] Patient Account Number: [id number] Date of Birth/Sex: [birth year] (68 y.o. M) Treating RN: Army Melia Primary Care Dantre Yearwood: [name] Other Clinician: Referring Krishay Faro: Lovie Macadamia, DAVID Treating Ayyan Sites/Extender: Melburn Hake, HOYT Weeks in Treatment: 0 Vital Signs Height(in): 71 Pulse(bpm): 100 Weight(lbs): 285 Blood Pressure(mmHg): 114/71 Body Mass Index(BMI): 40 Temperature(F): 97.7 Respiratory Rate 18 (breaths/min): Photos: [N/A:N/A] Wound Location: Sacrum - Medial N/A N/A Wounding Event: Pressure Injury N/A N/A Primary Etiology: Pressure Ulcer N/A N/A Comorbid History: Arrhythmia, Congestive Heart N/A N/A Failure, Coronary Artery Disease, Hypertension, Myocardial Infarction, Crohno s, Type II Diabetes Date Acquired: 03/16/2019 N/A N/A Weeks of Treatment: 0 N/A N/A Wound Status: Open N/A N/A Measurements L x W x D 9.9x6.5x4.7 N/A N/A (cm) Area (cm) : 50.54 N/A N/A Volume (cm) : 237.54 N/A N/A % Reduction in Area: 0.00% N/A N/A % Reduction in Volume: 0.00% N/A N/A Position 1 (o'clock): 7 Maximum Distance 1 (cm): 7.5 Position 2 (o'clock): 4 Maximum Distance 2 (cm): 5.2 Starting Position 1 12 (o'clock): Ending Position 1 12 (o'clock): Maximum Distance 1 (cm): 4.5 Tunneling: Yes N/A N/A Undermining: Yes N/A N/A Classification: Category/Stage IV N/A N/A DALEN, [place] ([id number]) Exudate Amount:  Large N/A N/A Exudate Type: Serous N/A N/A Exudate Color: amber N/A N/A Wound Margin: Flat and Intact N/A N/A Granulation Amount: Medium (34-66%) N/A N/A Granulation Quality: Red N/A N/A Necrotic Amount: Medium (34-66%) N/A N/A Exposed Structures: Fat Layer (Subcutaneous N/A N/A Tissue) Exposed: Yes Muscle: Yes Fascia: No Tendon: No Joint: No Bone: No Epithelialization: None N/A N/A Treatment Notes Electronic Signature(s) Signed: 05/20/2019 8:11:06 AM By: Army Melia Entered By: Army Melia on 05/09/2019 10:24:46 Colton Lamb ([id number]) -------------------------------------------------------------------------------- Multi-Disciplinary Care Plan Details Patient Name: MARKEISE, MATHEWS. Date of Service: 05/09/2019 9:45 AM Medical Record Number: [id number] Patient Account Number: [id number] Date of Birth/Sex: 04-May-1951 (68 y.o. M) Treating RN: Army Melia Primary Care Anastashia Westerfeld: [name] Other Clinician: Referring Gurnie Duris: Lovie Macadamia, DAVID Treating Mica Ramdass/Extender: Melburn Hake, HOYT Weeks in Treatment: 0 Active Inactive Abuse / Safety / Falls /  Self Care Management Nursing Diagnoses: Potential for falls Goals: Patient/caregiver will demonstrate safe use of adaptive devices to increase mobility Date Initiated: 05/09/2019 Target Resolution Date: 06/07/2019 Goal Status: Active Interventions: Assess fall risk on admission and as needed Notes: Orientation to the Wound Care Program Nursing Diagnoses: Knowledge deficit related to the wound healing center program Goals: Patient/caregiver will verbalize understanding of the [facility] Date Initiated: 05/09/2019 Target Resolution Date: 06/07/2019 Goal Status: Active Interventions: Provide education on orientation to the wound center Notes: Pressure Nursing Diagnoses: Knowledge deficit related to management of pressures ulcers Goals: Patient/caregiver will verbalize risk factors for pressure  ulcer development Date Initiated: 05/09/2019 Target Resolution Date: 06/07/2019 Goal Status: Active Interventions: Assess: immobility, friction, shearing, incontinence upon admission and as needed TAMIR, WALLMAN ([id number]) Notes: Wound/Skin Impairment Nursing Diagnoses: Impaired tissue integrity Goals: Ulcer/skin breakdown will have a volume reduction of 30% by week 4 Date Initiated: 05/09/2019 Target Resolution Date: 06/07/2019 Goal Status: Active Interventions: Assess ulceration(s) every visit Notes: Electronic Signature(s) Signed: 05/20/2019 8:11:06 AM By: Army Melia Entered By: Army Melia on 05/09/2019 10:24:11 Colton Lamb ([id number]) -------------------------------------------------------------------------------- Pain Assessment Details Patient Name: Colton Lamb. Date of Service: 05/09/2019 9:45 AM Medical Record Number: [id number] Patient Account Number: [id number] Date of Birth/Sex: Mar 05, 1951 (68 y.o. M) Treating RN: Montey Hora Primary Care Kimothy Kishimoto: [name] Other Clinician: Referring Desiderio Dolata: Lovie Macadamia, DAVID Treating Kermitt Harjo/Extender: Melburn Hake, HOYT Weeks in Treatment: 0 Active Problems Location of Pain Severity and Description of Pain Patient Has Paino Yes Site Locations Pain Location: Pain in Ulcers Pain Management and Medication Current Pain Management: Notes with movement Electronic Signature(s) Signed: 05/09/2019 4:46:01 PM By: Montey Hora Entered By: Montey Hora on 05/09/2019 09:35:55 Colton Lamb ([id number]) -------------------------------------------------------------------------------- Patient/Caregiver Education Details Patient Name: IZEYAH, DEIKE. Date of Service: 05/09/2019 9:45 AM Medical Record Number: [id number] Patient Account Number: [id number] Date of Birth/Gender: [birth year] (68 y.o. M) Treating RN: Army Melia Primary Care Physician: [name] Other Clinician: Referring Physician:  Lovie Macadamia DAVID Treating Physician/Extender: Sharalyn Ink in Treatment: 0 Education Assessment Education Provided To: Patient Education Topics Provided Wound/Skin Impairment: Handouts: Caring for Your Ulcer Methods: Demonstration, Explain/Verbal Responses: State content correctly Electronic Signature(s) Signed: 05/20/2019 8:11:06 AM By: Army Melia Entered By: Army Melia on 05/09/2019 10:33:34 Colton Lamb ([id number]) -------------------------------------------------------------------------------- Wound Assessment Details Patient Name: Colton Lamb, Colton Lamb. Date of Service: 05/09/2019 9:45 AM Medical Record Number: [id number] Patient Account Number: [id number] Date of Birth/Sex: Oct 08, 1950 (68 y.o. M) Treating RN: Montey Hora Primary Care Shawnn Bouillon: Lovie Macadamia, DAVID Other Clinician: Referring Anwyn Kriegel: Lovie Macadamia, DAVID Treating Naliah Eddington/Extender: STONE III, HOYT Weeks in Treatment: 0 Wound Status Wound Number: 1 Primary Pressure Ulcer Etiology: Wound Location: Sacrum - Medial Wound Open Wounding Event: Pressure Injury Status: Date Acquired: 03/16/2019 Comorbid Arrhythmia, Congestive Heart Failure, Coronary Weeks Of Treatment: 0 History: Artery Disease, Hypertension, Myocardial Clustered Wound: No Infarction, Crohnos, Type II Diabetes Photos Wound Measurements Length: (cm) 9.9 % Reduction in Ar Width: (cm) 6.5 % Reduction in Vo Depth: (cm) 4.7 Epithelialization Area: (cm) 50.54 Tunneling: Volume: (cm) 237.54 Location 1 Position (o Maximum Dis Location 2 Position (o Maximum Dis ea: 0% lume: 0% : None Yes 'clock): 7 tance: (cm) 7.5 'clock): 4 tance: (cm) 5.2 Undermining: Yes Starting Position (o'clock): 12 Ending Position (o'clock): 12 Maximum Distance: (cm) 4.5 Wound Description Classification: Category/Stage IV Foul Odor After C Wound Margin: Flat and Intact Slough/Fibrino Exudate Amount: Large Exudate Type: Serous Exudate Color:  amber ARGIE, APPLE[place]ATE ([id number]) leansing: No Yes Wound  Bed Granulation Amount: Medium (34-66%) Exposed Structure Granulation Quality: Red Fascia Exposed: No Necrotic Amount: Medium (34-66%) Fat Layer (Subcutaneous Tissue) Exposed: Yes Necrotic Quality: Adherent Slough Tendon Exposed: No Muscle Exposed: Yes Necrosis of Muscle: No Joint Exposed: No Bone Exposed: No Treatment Notes Wound #1 (Medial Sacrum) Notes dakins soaked gauze, ABD, tape Electronic Signature(s) Signed: 05/09/2019 4:46:01 PM By: Montey Hora Entered By: Montey Hora on 05/09/2019 10:03:19 Colton Lamb ([id number]) -------------------------------------------------------------------------------- Vitals Details Patient Name: Colton Lamb. Date of Service: 05/09/2019 9:45 AM Medical Record Number: [id number] Patient Account Number: [id number] Date of Birth/Sex: 1950-11-21 (68 y.o. M) Treating RN: Montey Hora Primary Care [place]ka Filice: Lovie Macadamia, DAVID Other Clinician: Referring Moe Brier: Lovie Macadamia, DAVID Treating Amor Hyle/Extender: Melburn Hake, HOYT Weeks in Treatment: 0 Vital Signs Time Taken: 09:38 Temperature (F): 97.7 Height (in): 71 Pulse (bpm): 100 Source: Stated Respiratory Rate (breaths/min): 18 Weight (lbs): 285 Blood Pressure (mmHg): 114/71 Source: Stated Reference Range: 80 - 120 mg / dl Body Mass Index (BMI): 39.7 Electronic Signature(s) Signed: 05/09/2019 4:46:01 PM By: Montey Hora Entered By: Montey Hora on 05/09/2019 09:38:51

## 2019-05-20 NOTE — Progress Notes (Signed)
JARIAN, LONGORIA ([id number]) Visit Report for 05/09/2019 Biopsy Details Patient Name: JENSON, BEEDLE. Date of Service: 05/09/2019 9:45 AM Medical Record Number: [id number] Patient Account Number: [id number] Date of Birth/Sex: 1951-06-25 (68 y.o. M) Treating RN: Army Melia Primary Care Provider: [name] Other Clinician: Referring Provider: Lovie Macadamia, DAVID Treating Provider/Extender: STONE III, HOYT Weeks in Treatment: 0 Biopsy Performed for: Wound #1 Medial Sacrum Location(s): Other: Sacral Bone Performed By: Physician STONE III, HOYT E., PA-C Tissue Punch: No Number of Specimens Taken: 1 Specimen Sent To Pathology: Yes Level of Consciousness (Pre-procedure): Awake and Alert Pre-procedure Verification/Time-Out Taken: Yes - 10:24 Pain Control: Lidocaine 4% Topical Solution Instrument: Rongeur Bleeding: Minimum Hemostasis Achieved: Pressure Procedural Pain: 0 Post Procedural Pain: 0 Response to Treatment: Procedure was tolerated well Level of Consciousness (Post-procedure): Awake and Alert Post Procedure Diagnosis Same as Pre-procedure Electronic Signature(s) Signed: 05/09/2019 11:08:54 AM By: Worthy Keeler PA-C Entered By: Worthy Keeler on 05/09/2019 11:08:53 [name] ([id number]) -------------------------------------------------------------------------------- Chief Complaint Document Details Patient Name: FORTUNE, BRANNIGAN. Date of Service: 05/09/2019 9:45 AM Medical Record Number: [id number] Patient Account Number: [id number] Date of Birth/Sex: Sep 02, 1950 (68 y.o. M) Treating RN: Army Melia Primary Care Provider: [name] Other Clinician: Referring Provider: Lovie Macadamia, DAVID Treating Provider/Extender: Melburn Hake, HOYT Weeks in Treatment: 0 Information Obtained from: Patient Chief Complaint Sacral pressure ulcer Electronic Signature(s) Signed: 05/09/2019 10:12:00 AM By: Worthy Keeler PA-C Entered By: Worthy Keeler on 05/09/2019  10:12:00 [name] ([id number]) -------------------------------------------------------------------------------- Debridement Details Patient Name: [name] Date of Service: 05/09/2019 9:45 AM Medical Record Number: [id number] Patient Account Number: [id number] Date of Birth/Sex: 09-Sep-1950 (68 y.o. M) Treating RN: Army Melia Primary Care Provider: [name] Other Clinician: Referring Provider: Lovie Macadamia, DAVID Treating Provider/Extender: Melburn Hake, HOYT Weeks in Treatment: 0 Debridement Performed for Wound #1 Medial Sacrum Assessment: Performed By: Physician STONE III, HOYT E., PA-C Debridement Type: Debridement Level of Consciousness (Pre- Awake and Alert procedure): Pre-procedure Verification/Time Yes - 10:24 Out Taken: Start Time: 10:25 Pain Control: Lidocaine Total Area Debrided (L x W): 3 (cm) x 3 (cm) = 9 (cm) Tissue and other material Viable, Non-Viable, Bone, Slough, Subcutaneous, Fascia , Slough debrided: Level: Skin/Subcutaneous Tissue/Muscle/Bone Debridement Description: Excisional Instrument: Curette, Forceps, Rongeur, Scissors Specimen: Tissue Culture Number of Specimens Taken: 1 Bleeding: Minimum Hemostasis Achieved: Pressure End Time: 10:26 Response to Treatment: Procedure was tolerated well Level of Consciousness Awake and Alert (Post-procedure): Post Debridement Measurements of Total Wound Length: (cm) 9.9 Stage: Category/Stage IV Width: (cm) 6.5 Depth: (cm) 4.7 Volume: (cm) 237.54 Character of Wound/Ulcer Post Stable Debridement: Post Procedure Diagnosis Same as Pre-procedure Electronic Signature(s) Signed: 05/09/2019 5:11:53 PM By: Worthy Keeler PA-C Signed: 05/20/2019 8:11:06 AM By: Army Melia Entered By: Army Melia on 05/09/2019 10:27:36 [name] ([id number]) -------------------------------------------------------------------------------- HPI Details Patient Name: [name] Date of  Service: 05/09/2019 9:45 AM Medical Record Number: [id number] Patient Account Number: [id number] Date of Birth/Sex: [birth year] (68 y.o. M) Treating RN: Army Melia Primary Care Provider: [name] Other Clinician: Referring Provider: Lovie Macadamia, DAVID Treating Provider/Extender: Melburn Hake, HOYT Weeks in Treatment: 0 History of Present Illness HPI Description: 05/09/2019 upon evaluation today patient's wound bed actually showed signs of significant wound over the sacral area which does reveal bone exposed. The patient is a resident at [facility]. Subsequently he also does have a history of diabetes mellitus type 2, muscle weakness, cognitive communication deficit, a cardiac pacemaker, is on  long-term use of anticoagulants including Eliquis as well as a baby aspirin, and has Crohn's disease. With that being said this wound has been present since around mid August when the patient was actually in the hospital and apparently according to reviewed notes and records had issues with pleural effusions and in the end did have a chest tube as well. Subsequently this was again noted at that time and really has not improved significantly since. I do not see any evidence of any x-rays being performed we likely are getting a do that today. Subsequently also have not seen any specific cultures that were done that is also something I am going to obtain today. Especially since there is bone exposed and I can definitely get a sample of this to send for culture. The patient is in agreement with the plan today. Subsequently depending on the results of the culture we would obviously be able to also initiate the appropriate antibiotic regimen which would be beneficial for him as well. No fevers, chills, nausea, vomiting, or diarrhea. Electronic Signature(s) Signed: 05/09/2019 4:06:56 PM By: Worthy Keeler PA-C Entered By: Worthy Keeler on 05/09/2019 16:06:55 KA, FLAMMER ([id number]) -------------------------------------------------------------------------------- Physical Exam Details Patient Name: ZANDEN, COLVER Date of Service: 05/09/2019 9:45 AM Medical Record Number: [id number] Patient Account Number: [id number] Date of Birth/Sex: Jan 10, 1951 (68 y.o. M) Treating RN: Army Melia Primary Care Provider: [name] Other Clinician: Referring Provider: Lovie Macadamia, DAVID Treating Provider/Extender: STONE III, HOYT Weeks in Treatment: 0 Constitutional sitting or standing blood pressure is within target range for patient.. pulse regular and within target range for patient.Marland Kitchen respirations regular, non-labored and within target range for patient.Marland Kitchen temperature within target range for patient.. Well- nourished and well-hydrated in no acute distress. Eyes conjunctiva clear no eyelid edema noted. pupils equal round and reactive to light and accommodation. Ears, Nose, Mouth, and Throat no gross abnormality of ear auricles or external auditory canals. normal hearing noted during conversation. mucus membranes moist. Respiratory normal breathing without difficulty. clear to auscultation bilaterally. Cardiovascular regular rate and rhythm with normal S1, S2. Gastrointestinal (GI) soft, non-tender, non-distended, +BS. no ventral hernia noted. Musculoskeletal Patient unable to walk without assistance. no significant deformity or arthritic changes, no loss or range of motion, no clubbing. Psychiatric this patient is able to make decisions and demonstrates good insight into disease process. Patient is oriented to person only. pleasant and cooperative. Notes Upon evaluation today I did actually note the patient had bone exposed in the base of the wound and this was part of the sacrum. Subsequently I discussed that we needed to obtain a culture and pathology sample from this that I did actually obtain today samples  and specimens for both. Subsequently I do believe that depending on the results of the culture the patient does need to likely be on IV antibiotics of one type or another again we will see what once we get the results of the culture back. He has previously been on IV ceftriaxone although I am not sure he still not at this point. Otherwise I did attempt to remove some of the necrotic tissue including necrotic fascia today I was able to perform some of this without any complication but again some of it was still too tightly adhered. Electronic Signature(s) Signed: 05/09/2019 5:06:33 PM By: Worthy Keeler PA-C Entered By: Worthy Keeler on 05/09/2019 17:06:32 NASSIR, NEIDERT ([id number]) -------------------------------------------------------------------------------- Physician Orders Details Patient Name: WADE, ASEBEDO. Date of Service: 05/09/2019 9:45 AM Medical Record  Number: [id number] Patient Account Number: [id number] Date of Birth/Sex: Mar 10, 1951 (68 y.o. M) Treating RN: Army Melia Primary Care Provider: [name] Other Clinician: Referring Provider: Lovie Macadamia, DAVID Treating Provider/Extender: Melburn Hake, HOYT Weeks in Treatment: 0 Verbal / Phone Orders: No Diagnosis Coding ICD-10 Coding Code Description L89.154 Pressure ulcer of sacral region, stage 4 E11.622 Type 2 diabetes mellitus with other skin ulcer M62.81 Muscle weakness (generalized) R41.841 Cognitive communication deficit Z95.0 Presence of cardiac pacemaker Z79.01 Long term (current) use of anticoagulants K50.90 Crohn's disease, unspecified, without complications Wound Cleansing Wound #1 Medial Sacrum o Clean wound with Normal Saline. - in office o Cleanse wound with mild soap and water Primary Wound Dressing Wound #1 Medial Sacrum o Other: - dakins soaked gauze packed into wound with continuous rolled gauze. NO separate pieces of gauze. Secondary Dressing Wound #1 Medial Sacrum o ABD pad -  secure with tape Dressing Change Frequency Wound #1 Medial Sacrum o Change dressing every day. - as needed if soiled or saturated Follow-up Appointments Wound #1 Medial Sacrum o Return Appointment in 2 weeks. Off-Loading Wound #1 Medial Sacrum o Roho cushion for wheelchair o Mattress - alternating air mattress needs to be used o Turn and reposition every 2 hours Additional Orders / Instructions Wound #1 Medial Sacrum o Vitamin A; Vitamin C, Zinc - decubivite can be used in place of individual vitamins ARRIE, ZUERCHER. ([id number]) o Increase protein intake. Laboratory o Bacteria identified in Wound by Culture (MICRO) oooo LOINC Code: [id number] oooo Convenience Name: Wound culture routine o Tissue Pathology biopsy report (PATH) - sacral bone biopsy oooo LOINC Code: 09735-3 oooo Convenience Name: Tiss Path Bx report Radiology o X-ray, other - sacrum, SNF to obtain mobile X-ray and fax the results to [facility] at [id number] Electronic Signature(s) Signed: 05/09/2019 5:11:53 PM By: Worthy Keeler PA-C Signed: 05/20/2019 8:11:06 AM By: Army Melia Entered By: Army Melia on 05/09/2019 10:43:50 [name] ([id number]) -------------------------------------------------------------------------------- Problem List Details Patient Name: LLEWELLYN, CHOPLIN. Date of Service: 05/09/2019 9:45 AM Medical Record Number: [id number] Patient Account Number: [id number] Date of Birth/Sex: [birth year] (68 y.o. M) Treating RN: Army Melia Primary Care Provider: [name] Other Clinician: Referring Provider: Lovie Macadamia, DAVID Treating Provider/Extender: Melburn Hake, HOYT Weeks in Treatment: 0 Active Problems ICD-10 Evaluated Encounter Code Description Active Date Today Diagnosis L89.154 Pressure ulcer of sacral region, stage 4 05/09/2019 No Yes E11.622 Type 2 diabetes mellitus with other skin ulcer 05/09/2019 No Yes M62.81 Muscle weakness (generalized) 05/09/2019 No  Yes R41.841 Cognitive communication deficit 05/09/2019 No Yes Z95.0 Presence of cardiac pacemaker 05/09/2019 No Yes Z79.01 Long term (current) use of anticoagulants 05/09/2019 No Yes K50.90 Crohn's disease, unspecified, without complications 40/03/1447 No Yes Inactive Problems Resolved Problems Electronic Signature(s) Signed: 05/09/2019 10:11:46 AM By: Worthy Keeler PA-C Entered By: Worthy Keeler on 05/09/2019 10:11:45 [name] ([id number]) -------------------------------------------------------------------------------- Progress Note Details Patient Name: [name] Date of Service: 05/09/2019 9:45 AM Medical Record Number: [id number] Patient Account Number: [id number] Date of Birth/Sex: 1951/06/25 (68 y.o. M) Treating RN: Army Melia Primary Care Provider: [name] Other Clinician: Referring Provider: Lovie Macadamia, DAVID Treating Provider/Extender: Melburn Hake, HOYT Weeks in Treatment: 0 Subjective Chief Complaint Information obtained from Patient Sacral pressure ulcer History of Present Illness (HPI) 05/09/2019 upon evaluation today patient's wound bed actually showed signs of significant wound over the sacral area which does reveal bone exposed. The patient is a resident at [facility]. Subsequently he also  does have a history of diabetes mellitus type 2, muscle weakness, cognitive communication deficit, a cardiac pacemaker, is on long-term use of anticoagulants including Eliquis as well as a baby aspirin, and has Crohn's disease. With that being said this wound has been present since around mid August when the patient was actually in the hospital and apparently according to reviewed notes and records had issues with pleural effusions and in the end did have a chest tube as well. Subsequently this was again noted at that time and really has not improved significantly since. I do not see  any evidence of any x-rays being performed we likely are getting a do that today. Subsequently also have not seen any specific cultures that were done that is also something I am going to obtain today. Especially since there is bone exposed and I can definitely get a sample of this to send for culture. The patient is in agreement with the plan today. Subsequently depending on the results of the culture we would obviously be able to also initiate the appropriate antibiotic regimen which would be beneficial for him as well. No fevers, chills, nausea, vomiting, or diarrhea. Patient History Information obtained from Patient. Allergies Shellfish Containing Products, shrimp, tetracycline HCl Family History Cancer - Mother, Diabetes - Siblings, Heart Disease - Father,Mother, Hypertension - Father,Mother, No family history of Hereditary Spherocytosis, Kidney Disease, Lung Disease, Seizures, Stroke, Thyroid Problems, Tuberculosis. Social History Never smoker, Marital Status - Widowed, Alcohol Use - Never, Drug Use - No History, Caffeine Use - Never. Medical History Ear/Nose/Mouth/Throat Denies history of Chronic sinus problems/congestion, Middle ear problems Cardiovascular Patient has history of Arrhythmia - a flutter, a fib, Congestive Heart Failure, Coronary Artery Disease, Hypertension, Myocardial Infarction - 2007, 2015 Denies history of Angina, Deep Vein Thrombosis, Hypotension, Peripheral Arterial Disease, Peripheral Venous Disease, Phlebitis, Vasculitis Gastrointestinal Patient has history of Crohn s Denies history of Cirrhosis , Colitis, Hepatitis A, Hepatitis B, Hepatitis C Endocrine AMAL, SAIKI ([id number]) Patient has history of Type II Diabetes Denies history of Type I Diabetes Integumentary (Skin) Denies history of History of Burn, History of pressure wounds Musculoskeletal Denies history of Gout, Rheumatoid Arthritis, Osteoarthritis, Osteomyelitis Psychiatric Denies  history of Anorexia/bulimia, Confinement Anxiety Patient is treated with Oral Agents. Medical And Surgical History Notes Ear/Nose/Mouth/Throat difficulty speaking Cardiovascular ischemic cardiomyopathy, HLD Gastrointestinal GERD Musculoskeletal unable to stand to transfer Psychiatric impaired cognition Review of Systems (ROS) Constitutional Symptoms (General Health) Denies complaints or symptoms of Fatigue, Fever, Chills, Marked Weight Change. Eyes Denies complaints or symptoms of Dry Eyes, Vision Changes, Glasses / Contacts. Ear/Nose/Mouth/Throat Denies complaints or symptoms of Difficult clearing ears, Sinusitis. Hematologic/Lymphatic Denies complaints or symptoms of Bleeding / Clotting Disorders, Human Immunodeficiency Virus. Respiratory Denies complaints or symptoms of Chronic or frequent coughs, Shortness of Breath. Cardiovascular Complains or has symptoms of LE edema. Denies complaints or symptoms of Chest pain. Gastrointestinal Denies complaints or symptoms of Frequent diarrhea, Nausea, Vomiting. Endocrine Denies complaints or symptoms of Hepatitis, Thyroid disease, Polydypsia (Excessive Thirst). Genitourinary Denies complaints or symptoms of Kidney failure/ Dialysis, Incontinence/dribbling. Immunological Denies complaints or symptoms of Hives, Itching. Integumentary (Skin) Complains or has symptoms of Wounds. Denies complaints or symptoms of Bleeding or bruising tendency, Breakdown, Swelling. Musculoskeletal Complains or has symptoms of Muscle Weakness. Denies complaints or symptoms of Muscle Pain. Neurologic Denies complaints or symptoms of Numbness/parasthesias, Focal/Weakness. Psychiatric Denies complaints or symptoms of Anxiety, Claustrophobia. CHUCKY, HOMES ([id number]) Objective Constitutional sitting or standing blood pressure is within target range for patient.. pulse  regular and within target range for patient.Marland Kitchen respirations regular, non-labored  and within target range for patient.Marland Kitchen temperature within target range for patient.. Well- nourished and well-hydrated in no acute distress. Vitals Time Taken: 9:38 AM, Height: 71 in, Source: Stated, Weight: 285 lbs, Source: Stated, BMI: 39.7, Temperature: 97.7 F, Pulse: 100 bpm, Respiratory Rate: 18 breaths/min, Blood Pressure: 114/71 mmHg. Eyes conjunctiva clear no eyelid edema noted. pupils equal round and reactive to light and accommodation. Ears, Nose, Mouth, and Throat no gross abnormality of ear auricles or external auditory canals. normal hearing noted during conversation. mucus membranes moist. Respiratory normal breathing without difficulty. clear to auscultation bilaterally. Cardiovascular regular rate and rhythm with normal S1, S2. Gastrointestinal (GI) soft, non-tender, non-distended, +BS. no ventral hernia noted. Musculoskeletal Patient unable to walk without assistance. no significant deformity or arthritic changes, no loss or range of motion, no clubbing. Psychiatric this patient is able to make decisions and demonstrates good insight into disease process. Patient is oriented to person only. pleasant and cooperative. General Notes: Upon evaluation today I did actually note the patient had bone exposed in the base of the wound and this was part of the sacrum. Subsequently I discussed that we needed to obtain a culture and pathology sample from this that I did actually obtain today samples and specimens for both. Subsequently I do believe that depending on the results of the culture the patient does need to likely be on IV antibiotics of one type or another again we will see what once we get the results of the culture back. He has previously been on IV ceftriaxone although I am not sure he still not at this point. Otherwise I did attempt to remove some of the necrotic tissue including necrotic fascia today I was able to perform some of this without any complication but again  some of it was still too tightly adhered. Integumentary (Hair, Skin) Wound #1 status is Open. Original cause of wound was Pressure Injury. The wound is located on the Medial Sacrum. The wound measures 9.9cm length x 6.5cm width x 4.7cm depth; 50.54cm^2 area and 237.54cm^3 volume. There is muscle and Fat Layer (Subcutaneous Tissue) Exposed exposed. Tunneling has been noted at 7:00 with a maximum distance of 7.5cm. There is additional tunneling and at 4:00 with a maximum distance of 5.2cm. Undermining begins at 12:00 and ends at 12:00 with a maximum distance of 4.5cm. There is a large amount of serous drainage noted. The wound margin is flat and intact. There is medium (34-66%) red granulation within the wound bed. There is a medium (34-66%) amount of necrotic tissue within the wound bed including Adherent Slough. TAJAH, SCHREINER ([id number]) Assessment Active Problems ICD-10 Pressure ulcer of sacral region, stage 4 Type 2 diabetes mellitus with other skin ulcer Muscle weakness (generalized) Cognitive communication deficit Presence of cardiac pacemaker Long term (current) use of anticoagulants Crohn's disease, unspecified, without complications Procedures Wound #1 Pre-procedure diagnosis of Wound #1 is a Pressure Ulcer located on the Medial Sacrum . There was a Excisional Skin/Subcutaneous Tissue/Muscle/Bone Debridement with a total area of 9 sq cm performed by STONE III, HOYT E., PA-C. With the following instrument(s): Curette, Forceps, Rongeur, and Scissors to remove Viable and Non-Viable tissue/material. Material removed includes Bone,Subcutaneous Tissue, Slough, and Fascia after achieving pain control using Lidocaine. 1 specimen was taken by a Tissue Culture and sent to the lab per facility protocol. A time out was conducted at 10:24, prior to the start of the procedure. A Minimum amount of  bleeding was controlled with Pressure. The procedure was tolerated well. Post Debridement  Measurements: 9.9cm length x 6.5cm width x 4.7cm depth; 237.54cm^3 volume. Post debridement Stage noted as Category/Stage IV. Character of Wound/Ulcer Post Debridement is stable. Post procedure Diagnosis Wound #1: Same as Pre-Procedure Pre-procedure diagnosis of Wound #1 is a Pressure Ulcer located on the Medial Sacrum . There was a biopsy performed by STONE III, HOYT E., PA-C. There was a biopsy performed on Sacral Bone. The skin was cleansed and prepped with anti- septic followed by pain control using Lidocaine 4% Topical Solution. Tissue was removed at its base with the following instrument(s): Rongeur and sent to pathology. A Minimum amount of bleeding was controlled with Pressure. A time out was conducted at 10:24, prior to the start of the procedure. The procedure was tolerated well with a pain level of 0 throughout and a pain level of 0 following the procedure. Post procedure Diagnosis Wound #1: Same as Pre-Procedure Plan Wound Cleansing: Wound #1 Medial Sacrum: Clean wound with Normal Saline. - in office Cleanse wound with mild soap and water Primary Wound Dressing: Wound #1 Medial Sacrum: Other: - dakins soaked gauze packed into wound with continuous rolled gauze. NO separate pieces of gauze. Secondary Dressing: Wound #1 Medial Sacrum: ABD pad - secure with tape TREYVIN, GLIDDEN ([id number]) Dressing Change Frequency: Wound #1 Medial Sacrum: Change dressing every day. - as needed if soiled or saturated Follow-up Appointments: Wound #1 Medial Sacrum: Return Appointment in 2 weeks. Off-Loading: Wound #1 Medial Sacrum: Roho cushion for wheelchair Mattress - alternating air mattress needs to be used Turn and reposition every 2 hours Additional Orders / Instructions: Wound #1 Medial Sacrum: Vitamin A; Vitamin C, Zinc - decubivite can be used in place of individual vitamins Increase protein intake. Laboratory ordered were: Wound culture routine, Tiss Path Bx report - sacral  bone biopsy Radiology ordered were: X-ray, other - sacrum, SNF to obtain mobile X-ray and fax the results to [facility] at [phone] 1. My suggestion currently is going to be that we initiate treatment with Dakin soaked gauze dressings for the time being. 2. I am also going to suggest at this point that we go ahead and just utilize an ABD pad secured with tape over top of this as it will need to be changed fairly frequently anyway. In fact he needs to be changed daily and as needed in between that if soiled or saturated. 3. I do think the patient needs to be appropriately offloaded every 2 hours and mention was made of that as well as utilizing alternating air mattress. He also needs a Roho cushion for his chair if he does not already have one he did not have that with him today if he does. I also suggest increased vitamin A, vitamin C, and zinc that can be taken in the form of Decubivite. 4. I will also see what the culture shows from the bone culture that was sent to the pharmacy as well as the bone pathology which was sent to pathology. 5. I am going also recommend an x-ray be performed at the skilled nursing facility for the patient to evaluate as to whether or not there is evidence of more extensive sacral disease as far as osteomyelitis is concerned. We will see patient back for reevaluation in 2 weeks here in the clinic. If anything worsens or changes patient will contact our office for additional recommendations. Electronic Signature(s) Signed: 05/09/2019 5:08:35 PM By: Worthy Keeler PA-C Entered By:  Worthy Keeler on 05/09/2019 17:08:35 RAYANE, GALLARDO ([id number]) -------------------------------------------------------------------------------- ROS/PFSH Details Patient Name: FESTUS, PURSEL. Date of Service: 05/09/2019 9:45 AM Medical Record Number: [id number] Patient Account Number: [id number] Date of Birth/Sex: Sep 26, 1950 (68 y.o. M) Treating RN: Montey Hora Primary Care  Provider: [name] Other Clinician: Referring Provider: Lovie Macadamia, DAVID Treating Provider/Extender: Melburn Hake, HOYT Weeks in Treatment: 0 Information Obtained From Patient Constitutional Symptoms (General Health) Complaints and Symptoms: Negative for: Fatigue; Fever; Chills; Marked Weight Change Eyes Complaints and Symptoms: Negative for: Dry Eyes; Vision Changes; Glasses / Contacts Ear/Nose/Mouth/Throat Complaints and Symptoms: Negative for: Difficult clearing ears; Sinusitis Medical History: Negative for: Chronic sinus problems/congestion; Middle ear problems Past Medical History Notes: difficulty speaking Hematologic/Lymphatic Complaints and Symptoms: Negative for: Bleeding / Clotting Disorders; Human Immunodeficiency Virus Respiratory Complaints and Symptoms: Negative for: Chronic or frequent coughs; Shortness of Breath Cardiovascular Complaints and Symptoms: Positive for: LE edema Negative for: Chest pain Medical History: Positive for: Arrhythmia - a flutter, a fib; Congestive Heart Failure; Coronary Artery Disease; Hypertension; Myocardial Infarction - 2007, 2015 Negative for: Angina; Deep Vein Thrombosis; Hypotension; Peripheral Arterial Disease; Peripheral Venous Disease; Phlebitis; Vasculitis Past Medical History Notes: ischemic cardiomyopathy, HLD Gastrointestinal Complaints and Symptoms: Negative for: Frequent diarrhea; Nausea; Vomiting DANTRELL, SCHERTZER ([id number]) Medical History: Positive for: Crohnos Negative for: Cirrhosis ; Colitis; Hepatitis A; Hepatitis B; Hepatitis C Past Medical History Notes: GERD Endocrine Complaints and Symptoms: Negative for: Hepatitis; Thyroid disease; Polydypsia (Excessive Thirst) Medical History: Positive for: Type II Diabetes Negative for: Type I Diabetes Time with diabetes: 6 years Treated with: Oral agents Genitourinary Complaints and Symptoms: Negative for: Kidney failure/ Dialysis;  Incontinence/dribbling Immunological Complaints and Symptoms: Negative for: Hives; Itching Integumentary (Skin) Complaints and Symptoms: Positive for: Wounds Negative for: Bleeding or bruising tendency; Breakdown; Swelling Medical History: Negative for: History of Burn; History of pressure wounds Musculoskeletal Complaints and Symptoms: Positive for: Muscle Weakness Negative for: Muscle Pain Medical History: Negative for: Gout; Rheumatoid Arthritis; Osteoarthritis; Osteomyelitis Past Medical History Notes: unable to stand to transfer Neurologic Complaints and Symptoms: Negative for: Numbness/parasthesias; Focal/Weakness Psychiatric Complaints and Symptoms: Negative for: Anxiety; Claustrophobia Medical History: DONNELL, WION ([id number]) Negative for: Anorexia/bulimia; Confinement Anxiety Past Medical History Notes: impaired cognition Oncologic Immunizations Pneumococcal Vaccine: Received Pneumococcal Vaccination: Yes Implantable Devices Yes Family and Social History Cancer: Yes - Mother; Diabetes: Yes - Siblings; Heart Disease: Yes - Father,Mother; Hereditary Spherocytosis: No; Hypertension: Yes - Father,Mother; Kidney Disease: No; Lung Disease: No; Seizures: No; Stroke: No; Thyroid Problems: No; Tuberculosis: No; Never smoker; Marital Status - Widowed; Alcohol Use: Never; Drug Use: No History; Caffeine Use: Never; Financial Concerns: No; Food, Clothing or Shelter Needs: No; Support System Lacking: No; Transportation Concerns: No Electronic Signature(s) Signed: 05/09/2019 4:46:01 PM By: Montey Hora Signed: 05/09/2019 5:11:53 PM By: Worthy Keeler PA-C Entered By: Montey Hora on 05/09/2019 09:46:43 LINC, RENNE ([id number]) -------------------------------------------------------------------------------- SuperBill Details Patient Name: AVARY, PITSENBARGER. Date of Service: 05/09/2019 Medical Record Number: [id number] Patient Account Number: [id number] Date of  Birth/Sex: Nov 14, 1950 (68 y.o. M) Treating RN: Army Melia Primary Care Provider: [name] Other Clinician: Referring Provider: Lovie Macadamia, DAVID Treating Provider/Extender: Melburn Hake, HOYT Weeks in Treatment: 0 Diagnosis Coding ICD-10 Codes Code Description L89.154 Pressure ulcer of sacral region, stage 4 E11.622 Type 2 diabetes mellitus with other skin ulcer M62.81 Muscle weakness (generalized) R41.841 Cognitive communication deficit Z95.0 Presence of cardiac pacemaker Z79.01 Long term (current) use of anticoagulants K50.90 Crohn's disease, unspecified, without complications Facility Procedures CPT4: Description Modifier Quantity Code  [id number] [place] VISIT-LEV 3 EST PT 1 CPT4: 25956387 11106-Incisional biopsy of skin (e.g., wedge) (including simple closure, when performed) 1 single lesion ICD-10 Diagnosis Description L89.154 Pressure ulcer of sacral region, stage 4 Physician Procedures CPT4: Description Modifier Quantity Code [id number] 51884 - WC PHYS LEVEL 4 - NEW PT 25 1 ICD-10 Diagnosis Description L89.154 Pressure ulcer of sacral region, stage 4 E11.622 Type 2 diabetes mellitus with other skin ulcer M62.81 Muscle weakness  (generalized) R41.841 Cognitive communication deficit CPT4: 11106 Incisional biopsy of skin (e.g., wedge) (including simple closure, when performed) single 1 lesion ICD-10 Diagnosis Description L89.154 Pressure ulcer of sacral region, stage 4 Electronic Signature(s) Signed: 05/13/2019 8:43:53 AM By: Sharon Mt Signed: 05/13/2019 10:05:56 AM By: Aggie Moats ([id number]) Previous Signature: 05/13/2019 8:40:52 AM Version By: Sharon Mt Previous Signature: 05/13/2019 8:40:34 AM Version By: Sharon Mt Previous Signature: 05/09/2019 5:08:52 PM Version By: Worthy Keeler PA-C Entered By: Sharon Mt on 05/13/2019 08:43:52

## 2019-05-20 NOTE — Progress Notes (Signed)
[facility] Consult Note Telephone: [phone]  Fax: [phone]  TELEHEALTH VISIT STATEMENT Due to the COVID-19 crisis, this visit was done via telemedicine from my office. It was initiated and consented to by this patient and/or family.  PATIENT NAME: Colton Lamb [address] [place] [address] [phone] (home)  DOB: 31-Aug-1950 MRN: [id number]  PRIMARY CARE PROVIDER:   [name], MD, [address] [place] [address] [phone]  REFERRING PROVIDER:  [name], MD [address] [place],   [address] [phone]  RESPONSIBLE PARTY:   Extended Emergency Contact Information Primary Emergency Contact: [place] of [place] Phone: [phone] Mobile Phone: [phone] Relation: Son Secondary Emergency Contact: [facility] Address: [place],  [address] [address] [place] of [place] Phone: [phone] Relation: Sister   ASSESSMENT AND RECOMMENDATIONS:   1. Advance Care Planning/Goals of Care: Goals include to maximize quality of life and symptom management.  2. Symptom Management:   Pain: Denies pain, but when pressed states buttock is occasionally sore.  Dyspnea; Went to hospital over weekend with c/o SOB and feelings of heart flutter.  States this has resolved now.  Wound: Stage 4 on buttock, no measurements or resolution report available today. Will continue to follow. Rx by wound care nurse at SNF.  Mobility: States he is able to only transfer to chair, has PT currently. He also has as goal to go home, but is currently a high level of physical care with goals to improve function.  3. Family /Caregiver/Community Supports: Lives in SNF now, formerly at home. Wife is passed away. POA is son in [place], sister in law lives locally.  4. Cognitive / Functional decline: Able to converse on zoom, oriented x 3. Able to get OOB with help but has very high  functional goals.  5. Follow up Palliative Care Visit: Palliative care will continue to follow for goals of care clarification and symptom management. Return 4 weeks or prn.  I spent 25 minutes providing this consultation,  from 1200 to 1225. More than 50% of the time in this consultation was spent coordinating communication.   HISTORY OF PRESENT ILLNESS:  Colton Lamb is a 68 y.o. year old male with multiple medical problems including debility, wounds, afib, CAD, obesity, DM, decubitus ulcer, h/o altered mental status. Care was asked to follow this patient by consultation request of [name], MD to help address advance care planning and goals of care. This is a follow up visit.  CODE STATUS: FULL CODE  PPS: 40% weak HOSPICE ELIGIBILITY/DIAGNOSIS: TBD  PAST MEDICAL HISTORY:  Past Medical History:  Diagnosis Date  . Atrial flutter ([facility])    a. s/p Cardioversion 11/22/13, on amiodarone and Xarelto.  . Chronic systolic heart failure ([facility])    a. 10/2013 EF 20-25%, grade III DD, RV mildly dilated and sys fx mild/mod reduced;  b. 01/2014 Echo: EF 30-35%, gr3 DD, mod dil LA.  Marland Kitchen Coronary artery disease    a. s/p MI 2007/2015;  b. s/p prior PCI to the LAD/LCX/PDA/PL;  c. 2008: s/p Cypher DES to the OM.  Marland Kitchen Crohn's ileocolitis ([facility])   . GERD (gastroesophageal reflux disease)   . Hx of adenomatous colonic polyps 11/2003  . Hyperlipidemia   . Hypertension   . Ischemic cardiomyopathy    s. 01/2014 s/p MDT [id number] [name] XT DR single lead AICD.  Marland Kitchen Obesity   .  Paroxysmal atrial fibrillation ([facility])    a. CHA2DS2VASc = 4-->xarelto/amio.  . Sleep apnea   . Syncope    a.  11/2013 in setting of volume depletion and bradycardia due to dig toxicity   . Type II diabetes mellitus ([facility])     SOCIAL HX:  Social History   Tobacco Use  . Smoking status: Never Smoker  . Smokeless tobacco: Never Used  Substance Use Topics  . Alcohol use: No    ALLERGIES:  Allergies  Allergen Reactions  . Iodine  Other (See Comments)    Shortness of breath, swelling and hives  . Shrimp [Shellfish Allergy] Other (See Comments)    SWELLING    HIVES    SHORTNESS OF BREATH  . Tetracycline Rash     PERTINENT MEDICATIONS:  Outpatient Encounter Medications as of 05/20/2019  Medication Sig  . acetaminophen (TYLENOL) 325 MG tablet Take 1-2 tablets (325-650 mg total) by mouth every 4 (four) hours as needed for mild pain.  . Albuterol Sulfate 108 (90 Base) MCG/ACT AEPB Inhale 1 puff into the lungs every 6 (six) hours as needed (shortness of breath).   Marland Kitchen amoxicillin-clavulanate (AUGMENTIN) 875-125 MG tablet Take 1 tablet by mouth 2 (two) times daily.  Marland Kitchen aspirin 81 MG tablet Take 81 mg by mouth daily.   Marland Kitchen atorvastatin (LIPITOR) 80 MG tablet TAKE 1 TABLET BY MOUTH EVERY DAY (Patient taking differently: Take 80 mg by mouth daily. )  . balsalazide (COLAZAL) 750 MG capsule TAKE 1 CAPSULE (750 MG TOTAL) BY MOUTH 3 (THREE) TIMES DAILY. (Patient taking differently: Take 750 mg by mouth 3 (three) times daily. )  . dextromethorphan-guaiFENesin (MUCINEX DM) 30-600 MG 12hr tablet Take 1 tablet by mouth 2 (two) times daily.  Marland Kitchen dextromethorphan-guaiFENesin (ROBITUSSIN-DM) 10-100 MG/5ML liquid Take 10 mLs by mouth every 4 (four) hours as needed for cough.  . finasteride (PROSCAR) 5 MG tablet Take 1 tablet (5 mg total) by mouth daily.  . insulin aspart (NOVOLOG) 100 UNIT/ML injection Inject 5 Units into the skin 3 (three) times daily with meals. (Patient taking differently: Inject 5 Units into the skin 3 (three) times daily before meals. )  . insulin detemir (LEVEMIR) 100 UNIT/ML injection Inject 0.13 mLs (13 Units total) into the skin daily.  Marland Kitchen lactobacillus (FLORANEX/LACTINEX) PACK Take 1 packet (1 g total) by mouth 3 (three) times daily with meals.  . midodrine (PROAMATINE) 10 MG tablet Take 1 tablet (10 mg total) by mouth 3 (three) times daily.  . Multiple Vitamins-Minerals (MULTIVITAMIN WITH MINERALS) tablet Take 1 tablet by  mouth daily.  . niacin (NIASPAN) 1000 MG CR tablet Take 1,000 mg by mouth daily.  . Nutritional Supplements (FEEDING SUPPLEMENT, NEPRO CARB STEADY,) LIQD Take 237 mLs by mouth 2 (two) times daily between meals.  Marland Kitchen omeprazole (PRILOSEC) 20 MG capsule Take 20 mg by mouth daily.   . potassium chloride (KLOR-CON) 10 MEQ tablet Take 1 tablet (10 mEq total) by mouth daily.  . sodium hypochlorite (DAKIN'S 1/2 STRENGTH) external solution Irrigate with 1 application as directed 2 (two) times daily. (apply to sacral wound)  . tamsulosin (FLOMAX) 0.4 MG CAPS capsule Take 1 capsule (0.4 mg total) by mouth daily after breakfast.  . torsemide (DEMADEX) 20 MG tablet Take 2 tablets (40 mg) by mouth once daily  . vitamin C (ASCORBIC ACID) 500 MG tablet Take 500 mg by mouth 2 (two) times daily.  Alveda Reasons 20 MG TABS tablet TAKE 1 TABLET BY MOUTH EVERY DAY WITH LUNCH  .  zinc sulfate 220 (50 Zn) MG capsule Take 220 mg by mouth daily.   No facility-administered encounter medications on file as of 05/20/2019.     PHYSICAL EXAM / ROS:   Current and past weights: 170 lbs General: NAD, frail appearing, obese,  Cardiovascular: no chest pain reported,  No tachycardia noted now, no edema Pulmonary: no cough, recent  increased SOB, oxygen on now  Abdomen: appetite good, denies constipation, continent of bowel GU: denies dysuria, catheter reported MSK:  no joint deformities, non ambulatory, pivot transferring, working with PT to increase function Skin:  Sacral decubitus still being dressed, Stage 4., Right plantar wound Neurological: Weakness, but otherwise nonfocal  Jason Coop, NP

## 2019-05-23 ENCOUNTER — Ambulatory Visit: Payer: Medicare Other | Admitting: Physician Assistant

## 2019-05-31 ENCOUNTER — Other Ambulatory Visit: Payer: Self-pay

## 2019-05-31 ENCOUNTER — Encounter: Payer: Medicare Other | Admitting: Internal Medicine

## 2019-05-31 DIAGNOSIS — E11622 Type 2 diabetes mellitus with other skin ulcer: Secondary | ICD-10-CM | POA: Diagnosis not present

## 2019-05-31 NOTE — Progress Notes (Signed)
Colton Lamb ([id number]) Visit Report for 05/31/2019 HPI Details Patient Name: Colton Lamb, Colton Lamb. Date of Service: 05/31/2019 9:30 AM Medical Record Number: [id number] Patient Account Number: [id number] Date of Birth/Sex: 1951/04/04 (68 y.o. M) Treating RN: Montey Hora Primary Care Provider: [name] Other Clinician: Referring Provider: Lovie Macadamia, DAVID Treating Provider/Extender: Tito Dine in Treatment: 3 History of Present Illness HPI Description: 05/09/2019 upon evaluation today patient's wound bed actually showed signs of significant wound over the sacral area which does reveal bone exposed. The patient is a resident at [facility]. Subsequently he also does have a history of diabetes mellitus type 2, muscle weakness, cognitive communication deficit, a cardiac pacemaker, is on long-term use of anticoagulants including Eliquis as well as a baby aspirin, and has Crohn's disease. With that being said this wound has been present since around mid August when the patient was actually in the hospital and apparently according to reviewed notes and records had issues with pleural effusions and in the end did have a chest tube as well. Subsequently this was again noted at that time and really has not improved significantly since. I do not see any evidence of any x-rays being performed we likely are getting a do that today. Subsequently also have not seen any specific cultures that were done that is also something I am going to obtain today. Especially since there is bone exposed and I can definitely get a sample of this to send for culture. The patient is in agreement with the plan today. Subsequently depending on the results of the culture we would obviously be able to also initiate the appropriate antibiotic regimen which would be beneficial for him as well. No fevers, chills, nausea,  vomiting, or diarrhea. 05/31/2019; after the patient was last here he was apparently admitted to hospital with UTI, pneumonia although have not had a chance to look at this discharge summary. With regards to the tests we ordered for his stage IV sacral ulcer last time the bone biopsy showed acute osteochondral myelitis and the bone culture showed Citrobacter Amalonaticus. Plain x-ray of the sacrum and coccyx showed no fracture or lytic lesion. No osteomyelitis. I cannot see that this was actually addressed. He was on Rocephin early in October however the Citrobacter is resistant to this. We have been using Dakin's wet-to-dry to the wounds Electronic Signature(s) Signed: 05/31/2019 5:42:32 PM By: Linton Ham MD Entered By: Linton Ham on 05/31/2019 11:14:59 Colton Lamb, Colton Lamb ([id number]) -------------------------------------------------------------------------------- Physical Exam Details Patient Name: Colton Lamb, Colton Lamb. Date of Service: 05/31/2019 9:30 AM Medical Record Number: [id number] Patient Account Number: [id number] Date of Birth/Sex: 1951/07/07 (68 y.o. M) Treating RN: Montey Hora Primary Care Provider: [name] Other Clinician: Referring Provider: Lovie Macadamia, DAVID Treating Provider/Extender: Tito Dine in Treatment: 3 Constitutional Sitting or standing Blood Pressure is within target range for patient.. Pulse regular and within target range for patient.Marland Kitchen Respirations regular, non-labored and within target range.. Temperature is normal and within the target range for the patient.Marland Kitchen appears in no distress. Respiratory Respiratory effort is easy and symmetric bilaterally. Rate is normal at rest and on room air.. Bilateral breath sounds are clear and equal in all lobes with no wheezes, rales or rhonchi.. Cardiovascular Heart rhythm and rate regular, without murmur or gallop.. Gastrointestinal (GI) Abdomen is soft and non-distended without masses or  tenderness. Bowel sounds active in all quadrants.. Genitourinary (GU) Foley catheter in place. Notes Wound exam; the patient arrives  with a very large stage IV lower sacral wound. This does not have exposed bone. There is a lot of weeping drainage. No clear evidence of surrounding cellulitis. I get the sense that the degree of slough that we are trying to get off is actually better. Electronic Signature(s) Signed: 05/31/2019 5:42:32 PM By: Linton Ham MD Entered By: Linton Ham on 05/31/2019 11:14:08 Colton Lamb ([id number]) -------------------------------------------------------------------------------- Physician Orders Details Patient Name: Colton Lamb, Colton Lamb. Date of Service: 05/31/2019 9:30 AM Medical Record Number: [id number] Patient Account Number: [id number] Date of Birth/Sex: 01/02/1951 (68 y.o. M) Treating RN: Montey Hora Primary Care Provider: Lovie Macadamia, DAVID Other Clinician: Referring Provider: Lovie Macadamia, DAVID Treating Provider/Extender: Tito Dine in Treatment: 3 Verbal / Phone Orders: No Diagnosis Coding Wound Cleansing Wound #1 Medial Sacrum o Clean wound with Normal Saline. - in office o Cleanse wound with mild soap and water Primary Wound Dressing Wound #1 Medial Sacrum o Other: - dakins soaked gauze packed into wound with continuous rolled gauze. NO separate pieces of gauze. Secondary Dressing Wound #1 Medial Sacrum o ABD pad - secure with tape Dressing Change Frequency Wound #1 Medial Sacrum o Change dressing every day. - as needed if soiled or saturated Follow-up Appointments Wound #1 Medial Sacrum o Return Appointment in 2 weeks. Off-Loading Wound #1 Medial Sacrum o Roho cushion for wheelchair o Mattress - alternating air mattress needs to be used o Turn and reposition every 2 hours Additional Orders / Instructions Wound #1 Medial Sacrum o Vitamin A; Vitamin C, Zinc - decubivite can be used in place of  individual vitamins o Increase protein intake. Consults o Infectious Disease - [facility] will send this referral and request ID office call facility to schedule Electronic Signature(s) Signed: 05/31/2019 5:07:31 PM By: Montey Hora Signed: 05/31/2019 5:42:32 PM By: Linton Ham MD Entered By: Montey Hora on 05/31/2019 10:45:26 Colton Lamb, Colton Lamb ([id number]) Colton Lamb, Colton Lamb ([id number]) -------------------------------------------------------------------------------- Problem List Details Patient Name: Colton Lamb, Colton Lamb. Date of Service: 05/31/2019 9:30 AM Medical Record Number: [id number] Patient Account Number: [id number] Date of Birth/Sex: [birth year] (68 y.o. M) Treating RN: Montey Hora Primary Care Provider: [name] Other Clinician: Referring Provider: Lovie Macadamia, DAVID Treating Provider/Extender: Tito Dine in Treatment: 3 Active Problems ICD-10 Evaluated Encounter Code Description Active Date Today Diagnosis L89.154 Pressure ulcer of sacral region, stage 4 05/09/2019 No Yes E11.622 Type 2 diabetes mellitus with other skin ulcer 05/09/2019 No Yes M62.81 Muscle weakness (generalized) 05/09/2019 No Yes R41.841 Cognitive communication deficit 05/09/2019 No Yes Z95.0 Presence of cardiac pacemaker 05/09/2019 No Yes Z79.01 Long term (current) use of anticoagulants 05/09/2019 No Yes K50.90 Crohn's disease, unspecified, without complications 97/12/7339 No Yes M86.18 Other acute osteomyelitis, other site 05/31/2019 No Yes Inactive Problems Resolved Problems Electronic Signature(s) Signed: 05/31/2019 5:42:32 PM By: Linton Ham MD Entered By: Linton Ham on 05/31/2019 11:10:15 Colton Lamb, Colton Lamb ([id number], Colton Lamb ([id number]) -------------------------------------------------------------------------------- Progress Note Details Patient Name: Colton Lamb, Colton Lamb. Date of Service: 05/31/2019 9:30 AM Medical Record Number:  [id number] Patient Account Number: [id number] Date of Birth/Sex: 1950/09/26 (68 y.o. M) Treating RN: Montey Hora Primary Care Provider: [name] Other Clinician: Referring Provider: Lovie Macadamia, DAVID Treating Provider/Extender: Tito Dine in Treatment: 3 Subjective History of Present Illness (HPI) 05/09/2019 upon evaluation today patient's wound bed actually showed signs of significant wound over the sacral area which does reveal bone exposed. The patient is a resident at [facility]. Subsequently he also does have  a history of diabetes mellitus type 2, muscle weakness, cognitive communication deficit, a cardiac pacemaker, is on long-term use of anticoagulants including Eliquis as well as a baby aspirin, and has Crohn's disease. With that being said this wound has been present since around mid August when the patient was actually in the hospital and apparently according to reviewed notes and records had issues with pleural effusions and in the end did have a chest tube as well. Subsequently this was again noted at that time and really has not improved significantly since. I do not see any evidence of any x-rays being performed we likely are getting a do that today. Subsequently also have not seen any specific cultures that were done that is also something I am going to obtain today. Especially since there is bone exposed and I can definitely get a sample of this to send for culture. The patient is in agreement with the plan today. Subsequently depending on the results of the culture we would obviously be able to also initiate the appropriate antibiotic regimen which would be beneficial for him as well. No fevers, chills, nausea, vomiting, or diarrhea. 05/31/2019; after the patient was last here he was apparently admitted to hospital with UTI, pneumonia although have not had a chance to look at this  discharge summary. With regards to the tests we ordered for his stage IV sacral ulcer last time the bone biopsy showed acute osteochondral myelitis and the bone culture showed Citrobacter Amalonaticus. Plain x-ray of the sacrum and coccyx showed no fracture or lytic lesion. No osteomyelitis. I cannot see that this was actually addressed. He was on Rocephin early in October however the Citrobacter is resistant to this. We have been using Dakin's wet-to-dry to the wounds Objective Constitutional Sitting or standing Blood Pressure is within target range for patient.. Pulse regular and within target range for patient.Marland Kitchen Respirations regular, non-labored and within target range.. Temperature is normal and within the target range for the patient.Marland Kitchen appears in no distress. Vitals Time Taken: 10:00 AM, Height: 71 in, Weight: 285 lbs, BMI: 39.7, Temperature: 97.8 F, Pulse: 104 bpm, Respiratory Rate: 20 breaths/min, Blood Pressure: 103/73 mmHg. Respiratory Respiratory effort is easy and symmetric bilaterally. Rate is normal at rest and on room air.. Bilateral breath sounds are clear and equal in all lobes with no wheezes, rales or rhonchi.. Cardiovascular Heart rhythm and rate regular, without murmur or gallop.Marland Kitchen Colton Lamb, Colton Lamb ([id number]) Gastrointestinal (GI) Abdomen is soft and non-distended without masses or tenderness. Bowel sounds active in all quadrants.. Genitourinary (GU) Foley catheter in place. 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 This does not have exposed bone. There is a lot of weeping drainage. No clear evidence of surrounding cellulitis. I get the sense that the degree of slough that we are trying to get off is actually better. Integumentary (Hair, Skin) Wound #1 status is Open. Original cause of wound was Pressure Injury. The wound is located on the Medial Sacrum. The wound measures 7.1cm length x 7.5cm width x 4cm depth;  41.822cm^2 area and 167.29cm^3 volume. There is muscle and Fat Layer (Subcutaneous Tissue) Exposed exposed. Tunneling has been noted at 8:00 with a maximum distance of 6.5cm. Undermining begins at 2:00 and ends at 4:00 with a maximum distance of 4.2cm. There is a large amount of serous drainage noted. The wound margin is flat and intact. There is medium (34-66%) Colton Lamb granulation within the wound bed. There is a medium (  34-66%) amount of necrotic tissue within the wound bed including Adherent Slough. Assessment Active Problems ICD-10 Pressure ulcer of sacral region, stage 4 Type 2 diabetes mellitus with other skin ulcer Muscle weakness (generalized) Cognitive communication deficit Presence of cardiac pacemaker Long term (current) use of anticoagulants Crohn's disease, unspecified, without complications Other acute osteomyelitis, other site Plan Wound Cleansing: Wound #1 Medial Sacrum: Clean wound with Normal Saline. - in office Cleanse wound with mild soap and water Primary Wound Dressing: Wound #1 Medial Sacrum: Other: - dakins soaked gauze packed into wound with continuous rolled gauze. NO separate pieces of gauze. Secondary Dressing: Wound #1 Medial Sacrum: ABD pad - secure with tape Dressing Change Frequency: Wound #1 Medial Sacrum: Change dressing every day. - as needed if soiled or saturated Follow-up Appointments: Wound #1 Medial SacrumTAEJON, IRANI ([id number]) Return Appointment in 2 weeks. Off-Loading: Wound #1 Medial Sacrum: Roho cushion for wheelchair Mattress - alternating air mattress needs to be used Turn and reposition every 2 hours Additional Orders / Instructions: Wound #1 Medial Sacrum: Vitamin A; Vitamin C, Zinc - decubivite can be used in place of individual vitamins Increase protein intake. Consults ordered were: Infectious Disease - [facility] will send this referral and request ID office call facility to schedule 1. 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 Culture of this grew Citrobacter. I cannot see that this was actually addressed however I think he really needs to see infectious disease and I have asked for this consult 2. Bone biopsy and culture confirm the diagnosis. I am not sure that I feel further imaging studies are negative. His original plain x-ray however did not show osteomyelitis. 3. If there is a substantial delay in getting the infectious disease consult I think it would be reasonable to treat him with a prolonged course of a quinolone or perhaps cefepime. Electronic Signature(s) Signed: 05/31/2019 5:42:32 PM By: Linton Ham MD Entered By: Linton Ham on 05/31/2019 11:16:46 Colton Lamb ([id number]) -------------------------------------------------------------------------------- SuperBill Details Patient Name: BURNIS, HALLING. Date of Service: 05/31/2019 Medical Record Number: [id number] Patient Account Number: [id number] Date of Birth/Sex: 26-Nov-1950 (68 y.o. M) Treating RN: Montey Hora Primary Care Provider: Lovie Macadamia, DAVID Other Clinician: Referring Provider: Lovie Macadamia, DAVID Treating Provider/Extender: Tito Dine in Treatment: 3 Diagnosis Coding ICD-10 Codes Code Description L89.154 Pressure ulcer of sacral region, stage 4 E11.622 Type 2 diabetes mellitus with other skin ulcer M62.81 Muscle weakness (generalized) R41.841 Cognitive communication deficit Z95.0 Presence of cardiac pacemaker Z79.01 Long term (current) use of anticoagulants K50.90 Crohn's disease, unspecified, without complications E31.54 Other acute osteomyelitis, other site Facility Procedures CPT4 Code: [id number] Description: 99213 - WOUND CARE VISIT-LEV 3 EST PT Modifier: Quantity: 1 Physician Procedures CPT4 Code: [id number] Description: 99213 - WC PHYS LEVEL 3 - EST PT ICD-10 Diagnosis Description L89.154 Pressure ulcer of sacral  region, stage 4 M86.18 Other acute osteomyelitis, other site Modifier: Quantity: 1 Electronic Signature(s) Signed: 05/31/2019 5:42:32 PM By: Linton Ham MD Entered By: Linton Ham on 05/31/2019 11:17:04

## 2019-06-05 ENCOUNTER — Other Ambulatory Visit: Payer: Self-pay

## 2019-06-05 ENCOUNTER — Ambulatory Visit: Payer: No Typology Code available for payment source | Attending: Family | Admitting: Family

## 2019-06-05 ENCOUNTER — Encounter: Payer: Self-pay | Admitting: Family

## 2019-06-05 VITALS — BP 109/91 | HR 105 | Ht 71.0 in | Wt 252.0 lb

## 2019-06-05 DIAGNOSIS — Z7982 Long term (current) use of aspirin: Secondary | ICD-10-CM | POA: Insufficient documentation

## 2019-06-05 DIAGNOSIS — I2510 Atherosclerotic heart disease of native coronary artery without angina pectoris: Secondary | ICD-10-CM | POA: Diagnosis not present

## 2019-06-05 DIAGNOSIS — K219 Gastro-esophageal reflux disease without esophagitis: Secondary | ICD-10-CM | POA: Insufficient documentation

## 2019-06-05 DIAGNOSIS — Z955 Presence of coronary angioplasty implant and graft: Secondary | ICD-10-CM | POA: Diagnosis not present

## 2019-06-05 DIAGNOSIS — I480 Paroxysmal atrial fibrillation: Secondary | ICD-10-CM | POA: Insufficient documentation

## 2019-06-05 DIAGNOSIS — I110 Hypertensive heart disease with heart failure: Secondary | ICD-10-CM | POA: Insufficient documentation

## 2019-06-05 DIAGNOSIS — R0602 Shortness of breath: Secondary | ICD-10-CM | POA: Insufficient documentation

## 2019-06-05 DIAGNOSIS — E119 Type 2 diabetes mellitus without complications: Secondary | ICD-10-CM | POA: Diagnosis not present

## 2019-06-05 DIAGNOSIS — I252 Old myocardial infarction: Secondary | ICD-10-CM | POA: Diagnosis not present

## 2019-06-05 DIAGNOSIS — Z79899 Other long term (current) drug therapy: Secondary | ICD-10-CM | POA: Insufficient documentation

## 2019-06-05 DIAGNOSIS — R05 Cough: Secondary | ICD-10-CM | POA: Insufficient documentation

## 2019-06-05 DIAGNOSIS — Z794 Long term (current) use of insulin: Secondary | ICD-10-CM | POA: Diagnosis not present

## 2019-06-05 DIAGNOSIS — K5080 Crohn's disease of both small and large intestine without complications: Secondary | ICD-10-CM | POA: Insufficient documentation

## 2019-06-05 DIAGNOSIS — E785 Hyperlipidemia, unspecified: Secondary | ICD-10-CM | POA: Insufficient documentation

## 2019-06-05 DIAGNOSIS — Z8249 Family history of ischemic heart disease and other diseases of the circulatory system: Secondary | ICD-10-CM | POA: Diagnosis not present

## 2019-06-05 DIAGNOSIS — I5022 Chronic systolic (congestive) heart failure: Secondary | ICD-10-CM | POA: Diagnosis not present

## 2019-06-05 DIAGNOSIS — I890 Lymphedema, not elsewhere classified: Secondary | ICD-10-CM | POA: Insufficient documentation

## 2019-06-05 DIAGNOSIS — Z95810 Presence of automatic (implantable) cardiac defibrillator: Secondary | ICD-10-CM | POA: Diagnosis not present

## 2019-06-05 DIAGNOSIS — E669 Obesity, unspecified: Secondary | ICD-10-CM | POA: Insufficient documentation

## 2019-06-05 DIAGNOSIS — I255 Ischemic cardiomyopathy: Secondary | ICD-10-CM | POA: Diagnosis not present

## 2019-06-05 DIAGNOSIS — Z8774 Personal history of (corrected) congenital malformations of heart and circulatory system: Secondary | ICD-10-CM | POA: Diagnosis not present

## 2019-06-05 DIAGNOSIS — I10 Essential (primary) hypertension: Secondary | ICD-10-CM

## 2019-06-05 DIAGNOSIS — Z7901 Long term (current) use of anticoagulants: Secondary | ICD-10-CM | POA: Diagnosis not present

## 2019-06-05 NOTE — Progress Notes (Signed)
Patient ID: Colton Lamb, male    DOB: 1950/12/10, 68 y.o.   MRN: [id number]  HPI  Colton Lamb is a 68 y/o male with a history of CAD (MI), DM, hyperlipidemia, HTN, GERD, atrial fibrillation, atrial flutter, sleep apnea and chronic heart failure.   Echo report from 02/08/2019 reviewed and showed an EF of 35-40%.  Was in the ED 05/18/2019 due to shortness of breath. Defibrillator was interrogated. IV lasix was given and he was released back to SNF. Was in the ED 04/30/2019 due to shortness of breath. COVID negative. Discharged with antibiotics back to SNF. Admitted 04/21/2019 due to septic shock and sacrum stage 4 pressure injury. Surgery, wound and palliative care consults obtained. Given IV antibiotics. Bedside debridement done of pressure ulcer. Discharged after 5 days.   He presents today for a follow-up visit with a chief complaint of minimal fatigue upon moderate exertion. He describes this as having been present for several years. He has associated cough and shortness of breath along with this. He denies any difficulty sleeping, dizziness, abdominal distention, palpitations, edema, chest pain or weight gain. He says that he's getting weighed daily in a hoyer lift as he's unable to stand to be weighed. Says that he's working with PT but doesn't think it's every day.   When patient arrived to the office, he was slouched all the way down in the wheelchair with PT straps holding him in. Straps had to be loosened and 3 people repositioned him so he was sitting correctly in the wheelchair. Straps were then reapplied to keep him sitting upright.   Past Medical History:  Diagnosis Date  . Atrial flutter ([place])    a. s/p Cardioversion 11/22/13, on amiodarone and Xarelto.  . CHF (congestive heart failure) ([place])   . Chronic systolic heart failure ([place])    a. 10/2013 EF 20-25%, grade III DD, RV mildly dilated and sys fx mild/mod reduced;  b. 01/2014 Echo: EF 30-35%, gr3 DD, mod dil LA.  Marland Kitchen Coronary artery  disease    a. s/p MI 2007/2015;  b. s/p prior PCI to the LAD/LCX/PDA/PL;  c. 2008: s/p Cypher DES to the OM.  Marland Kitchen Crohn's ileocolitis ([place])   . GERD (gastroesophageal reflux disease)   . Hx of adenomatous colonic polyps 11/2003  . Hyperlipidemia   . Hypertension   . Ischemic cardiomyopathy    s. 01/2014 s/p MDT [id number] Colton Lamb XT DR single lead AICD.  Marland Kitchen Obesity   . Paroxysmal atrial fibrillation ([facility])    a. CHA2DS2VASc = 4-->xarelto/amio.  . Sleep apnea   . Syncope    a.  11/2013 in setting of volume depletion and bradycardia due to dig toxicity   . Type II diabetes mellitus ([place])    Past Surgical History:  Procedure Laterality Date  . ATRIAL FLUTTER ABLATION N/A 04/16/2014   Procedure: ATRIAL FLUTTER ABLATION;  Surgeon: Colton Lance, MD;  Location[facility] C[facility] LAB;  Service: Cardiovascular;  Laterality: N/A;  . CARDIAC CATHETERIZATION  10/2013  . CARDIAC DEFIBRILLATOR PLACEMENT  04/16/2014   Medtronic Evira device  . CARDIAC ELECTROPHYSIOLOGY STUDY AND ABLATION  04/16/2014   atrial flutter ablation  . CARDIOVERSION N/A 03/05/2014   Procedure: CARDIOVERSION;  Surgeon: Colton Artist, MD;  Location: [facility] ENDOSCOPY;  Service: Cardiovascular;  Laterality: N/A;  . CATARACT EXTRACTION W/PHACO Right 01/04/2017   Procedure: CATARACT EXTRACTION PHACO AND INTRAOCULAR LENS PLACEMENT ([place])  Right Diabetic Complicated;  Surgeon: Colton Koyanagi, MD;  Location: [place];  Service: Ophthalmology;  Laterality: Right;  Diabetic  . CATARACT EXTRACTION W/PHACO Left 02/08/2017   Procedure: CATARACT EXTRACTION PHACO AND INTRAOCULAR LENS PLACEMENT ([place]) left diabetic;  Surgeon: Colton Koyanagi, MD;  Location: [place];  Service: Ophthalmology;  Laterality: Left;  Diabetic - oral meds sleep apnea  . CORONARY ANGIOPLASTY WITH STENT PLACEMENT  2007; 2008 X 2   "1+1 ~ 1"  . FOOT SURGERY Left    bone spur  . HYDROCELE EXCISION Bilateral   . Ileocecal resection and sigmoid enterocolonic  fistula repair  09/1998  . IMPLANTABLE CARDIOVERTER DEFIBRILLATOR IMPLANT N/A 04/16/2014   Procedure: IMPLANTABLE CARDIOVERTER DEFIBRILLATOR IMPLANT;  Surgeon: Colton Lance, MD;  Location: [facility] CATH LAB;  Service: Cardiovascular;  Laterality: N/A;  . LEFT HEART CATHETERIZATION WITH CORONARY ANGIOGRAM N/A 11/22/2013   Procedure: LEFT HEART CATHETERIZATION WITH CORONARY ANGIOGRAM;  Surgeon: Colton Grooms, MD;  Location: [facility] CATH LAB;  Service: Cardiovascular;  Laterality: N/A;   Family History  Problem Relation Age of Onset  . Breast cancer Mother   . Heart disease Father   . Heart attack Father   . Colon cancer Neg Hx    Social History   Tobacco Use  . Smoking status: Never Smoker  . Smokeless tobacco: Never Used  Substance Use Topics  . Alcohol use: No   Allergies  Allergen Reactions  . Iodine Other (See Comments)    Shortness of breath, swelling and hives  . Shrimp [Shellfish Allergy] Other (See Comments)    SWELLING    HIVES    SHORTNESS OF BREATH  . Tetracycline Rash   Prior to Admission medications   Medication Sig Start Date End Date Taking? Authorizing Provider  acetaminophen (TYLENOL) 325 MG tablet Take 1-2 tablets (325-650 mg total) by mouth every 4 (four) hours as needed for mild pain. 04/17/14  Yes Colton Serge, NP  Albuterol Sulfate 108 (90 Base) MCG/ACT AEPB Inhale 1 puff into the lungs every 6 (six) hours as needed (shortness of breath).    Yes [provider]  aspirin 81 MG tablet Take 81 mg by mouth daily.    Yes [provider]  atorvastatin (LIPITOR) 80 MG tablet TAKE 1 TABLET BY MOUTH EVERY DAY Patient taking differently: Take 80 mg by mouth daily.  03/08/19  Yes Gollan, Kathlene November, MD  balsalazide (COLAZAL) 750 MG capsule TAKE 1 CAPSULE (750 MG TOTAL) BY MOUTH 3 (THREE) TIMES DAILY. Patient taking differently: Take 750 mg by mouth 3 (three) times daily.  11/28/18  Yes Colton Artist, MD  dextromethorphan-guaiFENesin [facility] DM) 30-600 MG  12hr tablet Take 1 tablet by mouth 2 (two) times daily.   Yes [provider]  dextromethorphan-guaiFENesin (ROBITUSSIN-DM) 10-100 MG/5ML liquid Take 10 mLs by mouth every 4 (four) hours as needed for cough.   Yes [provider]  finasteride (PROSCAR) 5 MG tablet Take 1 tablet (5 mg total) by mouth daily. 03/20/19  Yes Wieting, Richard, MD  insulin aspart (NOVOLOG) 100 UNIT/ML injection Inject 5 Units into the skin 3 (three) times daily with meals. Patient taking differently: Inject 5 Units into the skin 3 (three) times daily before meals.  03/19/19  Yes Wieting, Richard, MD  insulin detemir (LEVEMIR) 100 UNIT/ML injection Inject 0.13 mLs (13 Units total) into the skin daily. Patient taking differently: Inject 9 Units into the skin daily.  03/19/19  Yes Wieting, Richard, MD  lactobacillus (FLORANEX/LACTINEX) PACK Take 1 packet (1 g total) by mouth 3 (three) times daily with meals. 03/19/19  Yes Loletha Grayer, MD  midodrine (PROAMATINE) 10 MG tablet Take 1 tablet (10 mg total) by mouth 3 (three) times daily. 04/26/19  Yes Max Sane, MD  Multiple Vitamins-Minerals (MULTIVITAMIN WITH MINERALS) tablet Take 1 tablet by mouth daily.   Yes [provider]  niacin (NIASPAN) 1000 MG CR tablet Take 1,000 mg by mouth daily. 04/10/19  Yes [provider]  Nutritional Supplements (FEEDING SUPPLEMENT, NEPRO CARB STEADY,) LIQD Take 237 mLs by mouth 2 (two) times daily between meals. 03/19/19  Yes Wieting, Richard, MD  omeprazole (PRILOSEC) 20 MG capsule Take 20 mg by mouth daily.    Yes [provider]  potassium chloride (KLOR-CON) 10 MEQ tablet Take 1 tablet (10 mEq total) by mouth daily. 05/08/19 08/06/19 Yes Minna Merritts, MD  tamsulosin (FLOMAX) 0.4 MG CAPS capsule Take 1 capsule (0.4 mg total) by mouth daily after breakfast. 03/20/19  Yes Leslye Peer, Richard, MD  torsemide (DEMADEX) 20 MG tablet Take 2 tablets (40 mg) by mouth once daily 05/08/19  Yes Gollan, Kathlene November,  MD  vitamin C (ASCORBIC ACID) 500 MG tablet Take 500 mg by mouth 2 (two) times daily.   Yes [provider]  XARELTO 20 MG TABS tablet TAKE 1 TABLET BY MOUTH EVERY DAY WITH LUNCH 05/17/19  Yes Gollan, Kathlene November, MD  sodium hypochlorite (DAKIN'S 1/2 STRENGTH) external solution Irrigate with 1 application as directed 2 (two) times daily. (apply to sacral wound)    [provider]  zinc sulfate 220 (50 Zn) MG capsule Take 220 mg by mouth daily.    [provider]    Review of Systems  Constitutional: Positive for fatigue (minimal). Negative for appetite change.  HENT: Negative for congestion, postnasal drip and sore throat.   Eyes: Negative.   Respiratory: Positive for cough (little bit) and shortness of breath (at times).   Cardiovascular: Negative for chest pain, palpitations and leg swelling.  Gastrointestinal: Negative for abdominal distention and abdominal pain.  Endocrine: Negative.   Genitourinary:       Has foley catheter present  Musculoskeletal: Negative for back pain and neck pain.  Allergic/Immunologic: Negative.   Neurological: Negative for dizziness and light-headedness.  Hematological: Negative for adenopathy. Does not bruise/bleed easily.  Psychiatric/Behavioral: Negative for dysphoric mood and sleep disturbance. The patient is not nervous/anxious.    Vitals:   06/05/19 1146  BP: (!) 109/91  Pulse: (!) 105  SpO2: 100%  Weight: 252 lb (114.3 kg)  Height: 5' 11"  (1.803 m)   Wt Readings from Last 3 Encounters:  06/05/19 252 lb (114.3 kg)  05/18/19 270 lb (122.5 kg)  04/30/19 270 lb (122.5 kg)   Lab Results  Component Value Date   CREATININE 0.99 05/18/2019   CREATININE 0.97 04/30/2019   CREATININE 1.71 (H) 04/26/2019     Physical Exam Vitals signs and nursing note reviewed.  Constitutional:      Appearance: Normal appearance.     Comments: Does fall asleep easily  HENT:     Head: Normocephalic and atraumatic.  Neck:      Musculoskeletal: Normal range of motion and neck supple.  Cardiovascular:     Rate and Rhythm: Regular rhythm. Tachycardia present.  Pulmonary:     Effort: Pulmonary effort is normal. No respiratory distress.     Breath sounds: No wheezing or rales.  Abdominal:     General: There is no distension.     Palpations: Abdomen is soft.  Musculoskeletal:        General:  No tenderness.     Right lower leg: Edema (2+ pitting) present.     Left lower leg: Edema (2+ pitting) present.  Skin:    General: Skin is warm and dry.  Neurological:     Mental Status: He is alert and oriented to person, place, and time.     Motor: Weakness (in legs) present.  Psychiatric:        Mood and Affect: Mood normal.        Behavior: Behavior normal.     Assessment & Plan:  1. Chronic heart failure with reduced ejection fraction- - NYHA class II - euvolemic today - being weighed daily with a hoyer lift due to weakness; reminded to call for an overnight weight gain of >2 pounds or a weekly weight gain of >5 pounds - not adding salt to his food - drinking water during the day - had telemedicine visit with cardiology Rockey Situ) 05/08/2019 - BP too low for entresto - may benefit from [place] - BNP 05/18/2019 was 721.0 - has ICD present - palliative care consult done 05/20/2019  2: HTN- - BP on the low side - seeing PCP at the facility right now - BMP from 05/18/2019 reviewed and showed sodium 146, potassium 3.3, creatinine 0.99 and GFR >60  3: DM-  - A1c 03/05/2019 was 6.8% - patient still has 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   Return in 3 months or  sooner for any questions/problems before then.   Return here in 3 months or sooner for any questions/problems before then.

## 2019-06-05 NOTE — Patient Instructions (Signed)
Continue weighing daily and call for an overnight weight gain of > 2 pounds or a weekly weight gain of >5 pounds. 

## 2019-06-06 NOTE — Progress Notes (Addendum)
Colton Lamb, Colton Lamb ([id number]) Visit Report for 05/31/2019 Arrival Information Details Patient Name: Colton Lamb, Colton Lamb. Date of Service: 05/31/2019 9:30 AM Medical Record Number: [id number] Patient Account Number: [id number] Date of Birth/Sex: 10/15/1950 (68 y.o. M) Treating RN: Leondro Barban Primary Care Solstice Lastinger: [name] Other Clinician: Referring Kareem Cathey: Lovie Macadamia, DAVID Treating Shellsea Borunda/Extender: Tito Dine in Treatment: 3 Visit Information History Since Last Visit Added or deleted any medications: No Patient Arrived: Wheel Chair Any new allergies or adverse reactions: No Arrival Time: 09:59 Had a fall or experienced change in No Accompanied By: caregiver activities of daily living that may affect Transfer Assistance: Harrel Lemon Lift risk of falls: Patient Identification Verified: Yes Signs or symptoms of abuse/neglect since last visito No Secondary Verification Process Completed: Yes Hospitalized since last visit: No Patient Has Alerts: Yes Has Dressing in Place as Prescribed: Yes Patient Alerts: DMII Pain Present Now: Yes Xarelto 56m aspirin Electronic Signature(s) Signed: 05/31/2019 4:47:34 PM By: BHarold BarbanEntered By: BHarold Barbanon 05/31/2019 10:00:13 BJob Lamb([id number] -------------------------------------------------------------------------------- Clinic Level of Care Assessment Details Patient Name: BJob FoundsDate of Service: 05/31/2019 9:30 AM Medical Record Number: [id number] Account Number: [id number] of Birth/Sex: [birth year](68 y.o. M) Treating RN: DMontey HoraPrimary Care Beronica Lansdale: BLovie Macadamia DAVID Other Clinician: Referring [name]: BLovie Macadamia DAVID Treating Adriel Kessen/Extender: RTito Dinein Treatment: 3 Clinic Level of Care Assessment Items TOOL 4 Quantity Score []  - Use when only an EandM is performed on FOLLOW-UP visit 0 ASSESSMENTS - Nursing Assessment /  Reassessment X - Reassessment of Co-morbidities (includes updates in patient status) 1 10 X- 1 5 Reassessment of Adherence to Treatment Plan ASSESSMENTS - Wound and Skin Assessment / Reassessment X - Simple Wound Assessment / Reassessment - one wound 1 5 []  - 0 Complex Wound Assessment / Reassessment - multiple wounds []  - 0 Dermatologic / Skin Assessment (not related to wound area) ASSESSMENTS - Focused Assessment []  - Circumferential Edema Measurements - multi extremities 0 []  - 0 Nutritional Assessment / Counseling / Intervention []  - 0 Lower Extremity Assessment (monofilament, tuning fork, pulses) []  - 0 Peripheral Arterial Disease Assessment (using hand held doppler) ASSESSMENTS - Ostomy and/or Continence Assessment and Care X - Incontinence Assessment and Management 1 10 []  - 0 Ostomy Care Assessment and Management (repouching, etc.) PROCESS - Coordination of Care X - Simple Patient / Family Education for ongoing care 1 15 []  - 0 Complex (extensive) Patient / Family Education for ongoing care X- 1 10 Staff obtains CProgrammer, systems Records, Test Results / Process Orders []  - 0 Staff telephones HHA, Nursing Homes / Clarify orders / etc []  - 0 Routine Transfer to another Facility (non-emergent condition) []  - 0 Routine Hospital Admission (non-emergent condition) []  - 0 New Admissions / IBiomedical engineer/ Ordering NPWT, Apligraf, etc. []  - 0 Emergency Hospital Admission (emergent condition) X- 1 10 Simple Discharge Coordination BSEON, GAERTNER([id number] []  - 0 Complex (extensive) Discharge Coordination PROCESS - Special Needs []  - Pediatric / Minor Patient Management 0 []  - 0 Isolation Patient Management []  - 0 Hearing / Language / Visual special needs []  - 0 Assessment of Community assistance (transportation, D/C planning, etc.) []  - 0 Additional assistance / Altered mentation []  - 0 Support Surface(s) Assessment (bed, cushion, seat, etc.) INTERVENTIONS  - Wound Cleansing / Measurement X - Simple Wound Cleansing - one wound 1 5 []  - 0 Complex Wound Cleansing - multiple wounds X- 1 5 Wound Imaging (photographs - any number of wounds) []  -  0 Wound Tracing (instead of photographs) X- 1 5 Simple Wound Measurement - one wound []  - 0 Complex Wound Measurement - multiple wounds INTERVENTIONS - Wound Dressings X - Small Wound Dressing one or multiple wounds 1 10 []  - 0 Medium Wound Dressing one or multiple wounds []  - 0 Large Wound Dressing one or multiple wounds []  - 0 Application of Medications - topical []  - 0 Application of Medications - injection INTERVENTIONS - Miscellaneous []  - External ear exam 0 []  - 0 Specimen Collection (cultures, biopsies, blood, body fluids, etc.) []  - 0 Specimen(s) / Culture(s) sent or taken to Lab for analysis X- 1 10 Patient Transfer (multiple staff / Civil Service fast streamer / Similar devices) []  - 0 Simple Staple / Suture removal (25 or less) []  - 0 Complex Staple / Suture removal (26 or more) []  - 0 Hypo / Hyperglycemic Management (close monitor of Blood Glucose) []  - 0 Ankle / Brachial Index (ABI) - do not check if billed separately X- 1 5 Vital Signs CLOUD, GRAHAM. ([id number]) Has the patient been seen at the hospital within the last three years: Yes Total Score: 105 Level Of Care: New/Established - Level 3 Electronic Signature(s) Signed: 05/31/2019 5:07:31 PM By: Montey Hora Entered By: Montey Hora on 05/31/2019 10:54:05 Colton Lamb ([id number]) -------------------------------------------------------------------------------- Encounter Discharge Information Details Patient Name: Colton Lamb, Colton Lamb. Date of Service: 05/31/2019 9:30 AM Medical Record Number: [id number] Patient Account Number: [id number] Date of Birth/Sex: 02-09-1951 (68 y.o. M) Treating RN: Montey Hora Primary Care Caydin Yeatts: [name] Other Clinician: Referring Luigi Stuckey: Lovie Macadamia, DAVID Treating  Senya Hinzman/Extender: Tito Dine in Treatment: 3 Encounter Discharge Information Items Discharge Condition: Stable Ambulatory Status: Wheelchair Discharge Destination: [place] Telephoned: No Orders Sent: Yes Transportation: Private Auto Accompanied By: caregiver Schedule Follow-up Appointment: Yes Clinical Summary of Care: Electronic Signature(s) Signed: 05/31/2019 10:56:25 AM By: Montey Hora Entered By: Montey Hora on 05/31/2019 10:56:24 Colton Lamb ([id number]) -------------------------------------------------------------------------------- Lower Extremity Assessment Details Patient Name: Colton Lamb, Colton Lamb. Date of Service: 05/31/2019 9:30 AM Medical Record Number: [id number] Patient Account Number: [id number] Date of Birth/Sex: 1951/04/06 (68 y.o. M) Treating RN: Mat Barban Primary Care Zlatan Hornback: [name] Other Clinician: Referring Terea Neubauer: Lovie Macadamia, DAVID Treating Laiya Wisby/Extender: Ricard Dillon Weeks in Treatment: 3 Notes Sacral wound Electronic Signature(s) Signed: 05/31/2019 4:47:34 PM By: Nickolis Barban Entered By: Kaleel Barban on 05/31/2019 10:03:36 Colton Lamb ([id number]) -------------------------------------------------------------------------------- Multi Wound Chart Details Patient Name: Colton Lamb, Colton Lamb. Date of Service: 05/31/2019 9:30 AM Medical Record Number: [id number] Patient Account Number: [id number] Date of Birth/Sex: [birth year] (68 y.o. M) Treating RN: Montey Hora Primary Care Kyndal Gloster: Lovie Macadamia, DAVID Other Clinician: Referring Hajer Dwyer: Lovie Macadamia, DAVID Treating Cherrie Franca/Extender: Tito Dine in Treatment: 3 Vital Signs Height(in): 71 Pulse(bpm): 104 Weight(lbs): 285 Blood Pressure(mmHg): 103/73 Body Mass Index(BMI): 40 Temperature(F): 97.8 Respiratory Rate 20 (breaths/min): Photos: [N/A:N/A] Wound Location: Sacrum - Medial N/A N/A Wounding Event:  Pressure Injury N/A N/A Primary Etiology: Pressure Ulcer N/A N/A Comorbid History: Arrhythmia, Congestive Heart N/A N/A Failure, Coronary Artery Disease, Hypertension, Myocardial Infarction, Crohno s, Type II Diabetes Date Acquired: 03/16/2019 N/A N/A Weeks of Treatment: 3 N/A N/A Wound Status: Open N/A N/A Measurements L x W x D 7.1x7.5x4 N/A N/A (cm) Area (cm) : 39.767 N/A N/A Volume (cm) : 167.29 N/A N/A % Reduction in Area: 17.20% N/A N/A % Reduction in Volume: 29.60% N/A N/A Position 1 (o'clock): 8 Maximum Distance 1 (cm): 6.5 Starting Position 1 2 (o'clock): Ending Position 1 4 (o'clock): Maximum  Distance 1 (cm): 4.2 Tunneling: Yes N/A N/A Undermining: Yes N/A N/A Classification: Category/Stage IV N/A N/A Exudate Amount: Large N/A N/A Exudate Type: Serous N/A N/A Colton Lamb, Colton Lamb ([id number]) Exudate Color: amber N/A N/A Wound Margin: Flat and Intact N/A N/A Granulation Amount: Medium (34-66%) N/A N/A Granulation Quality: Red N/A N/A Necrotic Amount: Medium (34-66%) N/A N/A Exposed Structures: Fat Layer (Subcutaneous N/A N/A Tissue) Exposed: Yes Muscle: Yes Fascia: No Tendon: No Joint: No Bone: No Epithelialization: None N/A N/A Treatment Notes Wound #1 (Medial Sacrum) Notes dakins soaked gauze, ABD, tape Electronic Signature(s) Signed: 05/31/2019 5:42:32 PM By: Linton Ham MD Entered By: Linton Ham on 05/31/2019 11:10:28 Colton Lamb ([id number]) -------------------------------------------------------------------------------- Multi-Disciplinary Care Plan Details Patient Name: Colton Lamb, Colton Lamb. Date of Service: 05/31/2019 9:30 AM Medical Record Number: [id number] Patient Account Number: [id number] Date of Birth/Sex: February 11, 1951 (68 y.o. M) Treating RN: Montey Hora Primary Care Hema Lanza: [name] Other Clinician: Referring Anhar Mcdermott: Lovie Macadamia DAVID Treating Mali Eppard/Extender: Tito Dine in Treatment: 3 Active  Inactive Electronic Signature(s) Signed: 07/04/2019 1:27:34 PM By: Gretta Cool, BSN, RN, CWS, Kim RN, BSN Signed: 09/20/2019 12:04:51 PM By: Montey Hora Previous Signature: 05/31/2019 5:07:31 PM Version By: Montey Hora Entered By: Gretta Cool BSN, RN, CWS, Kim on 07/04/2019 13:27:33 Colton Lamb, Colton Lamb ([id number]) -------------------------------------------------------------------------------- Pain Assessment Details Patient Name: Colton Lamb, Colton Lamb. Date of Service: 05/31/2019 9:30 AM Medical Record Number: [id number] Patient Account Number: [id number] Date of Birth/Sex: 1951/07/26 (68 y.o. M) Treating RN: Daryus Barban Primary Care Bastien Strawser: [name] Other Clinician: Referring Azai Gaffin: Lovie Macadamia, DAVID Treating Aniah Pauli/Extender: Tito Dine in Treatment: 3 Active Problems Location of Pain Severity and Description of Pain Patient Has Paino Yes Site Locations Rate the pain. Current Pain Level: 5 Pain Management and Medication Current Pain Management: Electronic Signature(s) Signed: 05/31/2019 4:47:34 PM By: Briceson Barban Entered By: Adetokunbo Barban on 05/31/2019 10:00:24 Colton Lamb ([id number]) -------------------------------------------------------------------------------- Patient/Caregiver Education Details Patient Name: Colton Lamb Date of Service: 05/31/2019 9:30 AM Medical Record Number: [id number] Patient Account Number: [id number] Date of Birth/Gender: 01-Feb-1951 (68 y.o. M) Treating RN: Montey Hora Primary Care Physician: [name] Other Clinician: Referring Physician: Lovie Macadamia DAVID Treating Physician/Extender: Tito Dine in Treatment: 3 Education Assessment Education Provided To: Caregiver SNF nurses Education Topics Provided Wound/Skin Impairment: Handouts: Other: signed wound care orders Methods: Printed Electronic Signature(s) Signed: 05/31/2019 5:07:31 PM By: Montey Hora Entered By: Montey Hora  on 05/31/2019 10:54:34 Colton Lamb ([id number]) -------------------------------------------------------------------------------- Wound Assessment Details Patient Name: Colton Lamb, Colton Lamb. Date of Service: 05/31/2019 9:30 AM Medical Record Number: [id number] Patient Account Number: [id number] Date of Birth/Sex: 13-Feb-1951 (68 y.o. M) Treating RN: Cornell Barman Primary Care Arty Lantzy: Lovie Macadamia, DAVID Other Clinician: Referring Danford Tat: Lovie Macadamia, DAVID Treating Matika Bartell/Extender: Tito Dine in Treatment: 3 Wound Status Wound Number: 1 Primary Pressure Ulcer Etiology: Wound Location: Sacrum - Medial Wound Open Wounding Event: Pressure Injury Status: Date Acquired: 03/16/2019 Comorbid Arrhythmia, Congestive Heart Failure, Coronary Weeks Of Treatment: 3 History: Artery Disease, Hypertension, Myocardial Clustered Wound: No Infarction, Crohnos, Type II Diabetes Photos Wound Measurements Length: (cm) 7.1 % Reduction in Width: (cm) 7.5 % Reduction in Depth: (cm) 4 Epithelializati Area: (cm) 41.822 Tunneling: Volume: (cm) 167.29 Position (o Maximum Dist Area: 17.2% Volume: 29.6% on: None Yes 'clock): 8 ance: (cm) 6.5 Undermining: Yes Starting Position (o'clock): 2 Ending Position (o'clock): 4 Maximum Distance: (cm) 4.2 Wound Description Classification: Category/Stage IV Foul Odor Afte Wound Margin: Flat and Intact Slough/Fibrino Exudate Amount: Large Exudate Type: Serous Exudate Color: amber r  Cleansing: No Yes Wound Bed Granulation Amount: Medium (34-66%) Exposed Structure Granulation Quality: Red Fascia Exposed: No Necrotic Amount: Medium (34-66%) Fat Layer (Subcutaneous Tissue) Exposed: Yes Colton Lamb, MINSHALL ([id number]) Necrotic Quality: Adherent Slough Tendon Exposed: No Muscle Exposed: Yes Necrosis of Muscle: No Joint Exposed: No Bone Exposed: No Electronic Signature(s) Signed: 06/06/2019 5:19:38 PM By: Gretta Cool, BSN, RN, CWS, Kim RN,  BSN Entered By: Gretta Cool, BSN, RN, CWS, Kim on 05/31/2019 10:16:25 Colton Lamb ([id number]) -------------------------------------------------------------------------------- Vitals Details Patient Name: Colton Lamb, Colton Lamb. Date of Service: 05/31/2019 9:30 AM Medical Record Number: [id number] Patient Account Number: [id number] Date of Birth/Sex: [birth year] (68 y.o. M) Treating RN: Kieth Barban Primary Care Shaindel Sweeten: [name] Other Clinician: Referring Uzoma Vivona: Lovie Macadamia, DAVID Treating Grisela Mesch/Extender: Tito Dine in Treatment: 3 Vital Signs Time Taken: 10:00 Temperature (F): 97.8 Height (in): 71 Pulse (bpm): 104 Weight (lbs): 285 Respiratory Rate (breaths/min): 20 Body Mass Index (BMI): 39.7 Blood Pressure (mmHg): 103/73 Reference Range: 80 - 120 mg / dl Electronic Signature(s) Signed: 05/31/2019 4:47:34 PM By: Bergen Barban Entered By: Kolton Barban on 05/31/2019 10:03:17

## 2019-06-10 ENCOUNTER — Other Ambulatory Visit: Payer: Self-pay

## 2019-06-10 ENCOUNTER — Encounter: Payer: Self-pay | Admitting: Cardiovascular Disease

## 2019-06-10 ENCOUNTER — Telehealth: Payer: Medicare Other | Admitting: Cardiovascular Disease

## 2019-06-10 VITALS — BP 120/83 | HR 109 | Ht 72.0 in | Wt 244.0 lb

## 2019-06-12 ENCOUNTER — Inpatient Hospital Stay
Admission: EM | Admit: 2019-06-12 | Discharge: 2019-07-09 | DRG: 981 | Disposition: A | Discharge status: Skilled Nursing Facility | Payer: Medicare Other | Attending: Internal Medicine | Admitting: Internal Medicine

## 2019-06-12 ENCOUNTER — Emergency Department: Payer: Medicare Other

## 2019-06-12 ENCOUNTER — Encounter: Payer: Self-pay | Admitting: Emergency Medicine

## 2019-06-12 DIAGNOSIS — I420 Dilated cardiomyopathy: Secondary | ICD-10-CM | POA: Diagnosis not present

## 2019-06-12 DIAGNOSIS — Z888 Allergy status to other drugs, medicaments and biological substances status: Secondary | ICD-10-CM

## 2019-06-12 DIAGNOSIS — Z7189 Other specified counseling: Secondary | ICD-10-CM | POA: Diagnosis not present

## 2019-06-12 DIAGNOSIS — J9600 Acute respiratory failure, unspecified whether with hypoxia or hypercapnia: Secondary | ICD-10-CM | POA: Diagnosis present

## 2019-06-12 DIAGNOSIS — Z6832 Body mass index (BMI) 32.0-32.9, adult: Secondary | ICD-10-CM

## 2019-06-12 DIAGNOSIS — L89150 Pressure ulcer of sacral region, unstageable: Secondary | ICD-10-CM | POA: Diagnosis present

## 2019-06-12 DIAGNOSIS — I509 Heart failure, unspecified: Secondary | ICD-10-CM

## 2019-06-12 DIAGNOSIS — Z20828 Contact with and (suspected) exposure to other viral communicable diseases: Secondary | ICD-10-CM | POA: Diagnosis present

## 2019-06-12 DIAGNOSIS — Z7401 Bed confinement status: Secondary | ICD-10-CM

## 2019-06-12 DIAGNOSIS — Z452 Encounter for adjustment and management of vascular access device: Secondary | ICD-10-CM

## 2019-06-12 DIAGNOSIS — I480 Paroxysmal atrial fibrillation: Secondary | ICD-10-CM | POA: Diagnosis not present

## 2019-06-12 DIAGNOSIS — Z7982 Long term (current) use of aspirin: Secondary | ICD-10-CM

## 2019-06-12 DIAGNOSIS — K5000 Crohn's disease of small intestine without complications: Secondary | ICD-10-CM

## 2019-06-12 DIAGNOSIS — I130 Hypertensive heart and chronic kidney disease with heart failure and stage 1 through stage 4 chronic kidney disease, or unspecified chronic kidney disease: Principal | ICD-10-CM | POA: Diagnosis present

## 2019-06-12 DIAGNOSIS — Z86010 Personal history of colonic polyps: Secondary | ICD-10-CM

## 2019-06-12 DIAGNOSIS — R0682 Tachypnea, not elsewhere classified: Secondary | ICD-10-CM

## 2019-06-12 DIAGNOSIS — E1169 Type 2 diabetes mellitus with other specified complication: Secondary | ICD-10-CM | POA: Diagnosis present

## 2019-06-12 DIAGNOSIS — E119 Type 2 diabetes mellitus without complications: Secondary | ICD-10-CM | POA: Diagnosis not present

## 2019-06-12 DIAGNOSIS — J9601 Acute respiratory failure with hypoxia: Secondary | ICD-10-CM | POA: Diagnosis not present

## 2019-06-12 DIAGNOSIS — R000 Tachycardia, unspecified: Secondary | ICD-10-CM | POA: Diagnosis not present

## 2019-06-12 DIAGNOSIS — G2581 Restless legs syndrome: Secondary | ICD-10-CM | POA: Diagnosis present

## 2019-06-12 DIAGNOSIS — M8660 Other chronic osteomyelitis, unspecified site: Secondary | ICD-10-CM | POA: Diagnosis not present

## 2019-06-12 DIAGNOSIS — I4891 Unspecified atrial fibrillation: Secondary | ICD-10-CM | POA: Diagnosis present

## 2019-06-12 DIAGNOSIS — E1165 Type 2 diabetes mellitus with hyperglycemia: Secondary | ICD-10-CM | POA: Diagnosis present

## 2019-06-12 DIAGNOSIS — M4628 Osteomyelitis of vertebra, sacral and sacrococcygeal region: Secondary | ICD-10-CM | POA: Diagnosis present

## 2019-06-12 DIAGNOSIS — G9340 Encephalopathy, unspecified: Secondary | ICD-10-CM | POA: Diagnosis not present

## 2019-06-12 DIAGNOSIS — Z91013 Allergy to seafood: Secondary | ICD-10-CM

## 2019-06-12 DIAGNOSIS — I255 Ischemic cardiomyopathy: Secondary | ICD-10-CM | POA: Diagnosis present

## 2019-06-12 DIAGNOSIS — E118 Type 2 diabetes mellitus with unspecified complications: Secondary | ICD-10-CM | POA: Diagnosis not present

## 2019-06-12 DIAGNOSIS — I4819 Other persistent atrial fibrillation: Secondary | ICD-10-CM | POA: Diagnosis not present

## 2019-06-12 DIAGNOSIS — D638 Anemia in other chronic diseases classified elsewhere: Secondary | ICD-10-CM | POA: Diagnosis present

## 2019-06-12 DIAGNOSIS — J9811 Atelectasis: Secondary | ICD-10-CM | POA: Diagnosis present

## 2019-06-12 DIAGNOSIS — Z95810 Presence of automatic (implantable) cardiac defibrillator: Secondary | ICD-10-CM | POA: Diagnosis not present

## 2019-06-12 DIAGNOSIS — I252 Old myocardial infarction: Secondary | ICD-10-CM

## 2019-06-12 DIAGNOSIS — D649 Anemia, unspecified: Secondary | ICD-10-CM | POA: Diagnosis not present

## 2019-06-12 DIAGNOSIS — Z9861 Coronary angioplasty status: Secondary | ICD-10-CM

## 2019-06-12 DIAGNOSIS — Z66 Do not resuscitate: Secondary | ICD-10-CM | POA: Diagnosis not present

## 2019-06-12 DIAGNOSIS — Z7901 Long term (current) use of anticoagulants: Secondary | ICD-10-CM

## 2019-06-12 DIAGNOSIS — J9602 Acute respiratory failure with hypercapnia: Secondary | ICD-10-CM | POA: Diagnosis not present

## 2019-06-12 DIAGNOSIS — I959 Hypotension, unspecified: Secondary | ICD-10-CM | POA: Diagnosis not present

## 2019-06-12 DIAGNOSIS — Z515 Encounter for palliative care: Secondary | ICD-10-CM | POA: Diagnosis not present

## 2019-06-12 DIAGNOSIS — E669 Obesity, unspecified: Secondary | ICD-10-CM | POA: Diagnosis present

## 2019-06-12 DIAGNOSIS — I4892 Unspecified atrial flutter: Secondary | ICD-10-CM | POA: Diagnosis present

## 2019-06-12 DIAGNOSIS — J9621 Acute and chronic respiratory failure with hypoxia: Secondary | ICD-10-CM | POA: Diagnosis not present

## 2019-06-12 DIAGNOSIS — Z881 Allergy status to other antibiotic agents status: Secondary | ICD-10-CM

## 2019-06-12 DIAGNOSIS — I5023 Acute on chronic systolic (congestive) heart failure: Secondary | ICD-10-CM

## 2019-06-12 DIAGNOSIS — I2510 Atherosclerotic heart disease of native coronary artery without angina pectoris: Secondary | ICD-10-CM | POA: Diagnosis not present

## 2019-06-12 DIAGNOSIS — E785 Hyperlipidemia, unspecified: Secondary | ICD-10-CM | POA: Diagnosis present

## 2019-06-12 DIAGNOSIS — R338 Other retention of urine: Secondary | ICD-10-CM | POA: Diagnosis present

## 2019-06-12 DIAGNOSIS — Z8249 Family history of ischemic heart disease and other diseases of the circulatory system: Secondary | ICD-10-CM

## 2019-06-12 DIAGNOSIS — N401 Enlarged prostate with lower urinary tract symptoms: Secondary | ICD-10-CM | POA: Diagnosis present

## 2019-06-12 DIAGNOSIS — Z955 Presence of coronary angioplasty implant and graft: Secondary | ICD-10-CM

## 2019-06-12 DIAGNOSIS — R570 Cardiogenic shock: Secondary | ICD-10-CM | POA: Diagnosis not present

## 2019-06-12 DIAGNOSIS — I5043 Acute on chronic combined systolic (congestive) and diastolic (congestive) heart failure: Secondary | ICD-10-CM | POA: Diagnosis not present

## 2019-06-12 DIAGNOSIS — L89154 Pressure ulcer of sacral region, stage 4: Secondary | ICD-10-CM | POA: Diagnosis present

## 2019-06-12 DIAGNOSIS — D508 Other iron deficiency anemias: Secondary | ICD-10-CM | POA: Diagnosis not present

## 2019-06-12 DIAGNOSIS — I4821 Permanent atrial fibrillation: Secondary | ICD-10-CM | POA: Diagnosis not present

## 2019-06-12 DIAGNOSIS — K219 Gastro-esophageal reflux disease without esophagitis: Secondary | ICD-10-CM | POA: Diagnosis present

## 2019-06-12 DIAGNOSIS — Z794 Long term (current) use of insulin: Secondary | ICD-10-CM

## 2019-06-12 DIAGNOSIS — R159 Full incontinence of feces: Secondary | ICD-10-CM | POA: Diagnosis present

## 2019-06-12 DIAGNOSIS — G4733 Obstructive sleep apnea (adult) (pediatric): Secondary | ICD-10-CM | POA: Diagnosis present

## 2019-06-12 DIAGNOSIS — I340 Nonrheumatic mitral (valve) insufficiency: Secondary | ICD-10-CM | POA: Diagnosis not present

## 2019-06-12 DIAGNOSIS — I482 Chronic atrial fibrillation, unspecified: Secondary | ICD-10-CM | POA: Diagnosis present

## 2019-06-12 DIAGNOSIS — R262 Difficulty in walking, not elsewhere classified: Secondary | ICD-10-CM | POA: Diagnosis present

## 2019-06-12 DIAGNOSIS — IMO0002 Reserved for concepts with insufficient information to code with codable children: Secondary | ICD-10-CM | POA: Diagnosis present

## 2019-06-12 DIAGNOSIS — E440 Moderate protein-calorie malnutrition: Secondary | ICD-10-CM | POA: Diagnosis present

## 2019-06-12 DIAGNOSIS — I25118 Atherosclerotic heart disease of native coronary artery with other forms of angina pectoris: Secondary | ICD-10-CM | POA: Diagnosis not present

## 2019-06-12 DIAGNOSIS — Z960 Presence of urogenital implants: Secondary | ICD-10-CM | POA: Diagnosis not present

## 2019-06-12 DIAGNOSIS — K5080 Crohn's disease of both small and large intestine without complications: Secondary | ICD-10-CM | POA: Diagnosis present

## 2019-06-12 DIAGNOSIS — I4811 Longstanding persistent atrial fibrillation: Secondary | ICD-10-CM | POA: Diagnosis not present

## 2019-06-12 DIAGNOSIS — I9581 Postprocedural hypotension: Secondary | ICD-10-CM | POA: Diagnosis not present

## 2019-06-12 DIAGNOSIS — Z933 Colostomy status: Secondary | ICD-10-CM | POA: Diagnosis not present

## 2019-06-12 DIAGNOSIS — Z95828 Presence of other vascular implants and grafts: Secondary | ICD-10-CM | POA: Diagnosis not present

## 2019-06-12 DIAGNOSIS — Z79899 Other long term (current) drug therapy: Secondary | ICD-10-CM

## 2019-06-12 DIAGNOSIS — D72829 Elevated white blood cell count, unspecified: Secondary | ICD-10-CM | POA: Diagnosis not present

## 2019-06-12 DIAGNOSIS — I5022 Chronic systolic (congestive) heart failure: Secondary | ICD-10-CM | POA: Insufficient documentation

## 2019-06-12 DIAGNOSIS — E876 Hypokalemia: Secondary | ICD-10-CM | POA: Diagnosis present

## 2019-06-12 DIAGNOSIS — R0603 Acute respiratory distress: Secondary | ICD-10-CM | POA: Diagnosis not present

## 2019-06-12 DIAGNOSIS — R339 Retention of urine, unspecified: Secondary | ICD-10-CM | POA: Diagnosis not present

## 2019-06-12 LAB — CBC WITH DIFFERENTIAL/PLATELET
Abs Immature Granulocytes: 0.07 10*3/uL (ref 0.00–0.07)
Abs Immature Granulocytes: 0.06 10*3/uL (ref 0.00–0.07)
Basophils Absolute: 0.1 10*3/uL (ref 0.0–0.1)
Basophils Absolute: 0.1 10*3/uL (ref 0.0–0.1)
Basophils Relative: 1 %
Basophils Relative: 1 %
Eosinophils Absolute: 0.3 10*3/uL (ref 0.0–0.5)
Eosinophils Absolute: 0.3 10*3/uL (ref 0.0–0.5)
Eosinophils Relative: 2 %
Eosinophils Relative: 2 %
HCT: 31.3 % — ABNORMAL LOW (ref 39.0–52.0)
HCT: 29.4 % — ABNORMAL LOW (ref 39.0–52.0)
Hemoglobin: 9.4 g/dL — ABNORMAL LOW (ref 13.0–17.0)
Hemoglobin: 8.8 g/dL — ABNORMAL LOW (ref 13.0–17.0)
Immature Granulocytes: 1 %
Immature Granulocytes: 0 %
Lymphocytes Relative: 7 %
Lymphocytes Relative: 6 %
Lymphs Abs: 0.9 10*3/uL (ref 0.7–4.0)
Lymphs Abs: 0.8 10*3/uL (ref 0.7–4.0)
MCH: 25.5 pg — ABNORMAL LOW (ref 26.0–34.0)
MCH: 25.2 pg — ABNORMAL LOW (ref 26.0–34.0)
MCHC: 30 g/dL (ref 30.0–36.0)
MCHC: 29.9 g/dL — ABNORMAL LOW (ref 30.0–36.0)
MCV: 85.1 fL (ref 80.0–100.0)
MCV: 84.2 fL (ref 80.0–100.0)
Monocytes Absolute: 0.6 10*3/uL (ref 0.1–1.0)
Monocytes Absolute: 0.8 10*3/uL (ref 0.1–1.0)
Monocytes Relative: 5 %
Monocytes Relative: 6 %
Neutro Abs: 10.9 10*3/uL — ABNORMAL HIGH (ref 1.7–7.7)
Neutro Abs: 12.8 10*3/uL — ABNORMAL HIGH (ref 1.7–7.7)
Neutrophils Relative %: 84 %
Neutrophils Relative %: 85 %
Platelets: 364 10*3/uL (ref 150–400)
Platelets: 337 10*3/uL (ref 150–400)
RBC: 3.68 MIL/uL — ABNORMAL LOW (ref 4.22–5.81)
RBC: 3.49 MIL/uL — ABNORMAL LOW (ref 4.22–5.81)
RDW: 16.9 % — ABNORMAL HIGH (ref 11.5–15.5)
RDW: 17 % — ABNORMAL HIGH (ref 11.5–15.5)
WBC: 12.8 10*3/uL — ABNORMAL HIGH (ref 4.0–10.5)
WBC: 14.9 10*3/uL — ABNORMAL HIGH (ref 4.0–10.5)
nRBC: 0 % (ref 0.0–0.2)
nRBC: 0 % (ref 0.0–0.2)

## 2019-06-12 LAB — GLUCOSE, CAPILLARY
Glucose-Capillary: 107 mg/dL — ABNORMAL HIGH (ref 70–99)
Glucose-Capillary: 101 mg/dL — ABNORMAL HIGH (ref 70–99)
Glucose-Capillary: 87 mg/dL (ref 70–99)

## 2019-06-12 LAB — BLOOD GAS, VENOUS
Acid-Base Excess: 20.7 mmol/L — ABNORMAL HIGH (ref 0.0–2.0)
Bicarbonate: 50.9 mmol/L — ABNORMAL HIGH (ref 20.0–28.0)
O2 Saturation: 38.6 %
Patient temperature: 37
pCO2, Ven: 86 mmHg (ref 44.0–60.0)
pH, Ven: 7.38 (ref 7.250–7.430)
pO2, Ven: <31 mmHg — CL (ref 32.0–45.0)

## 2019-06-12 LAB — COMPREHENSIVE METABOLIC PANEL
ALT: 15 U/L (ref 0–44)
AST: 27 U/L (ref 15–41)
Albumin: 2.9 g/dL — ABNORMAL LOW (ref 3.5–5.0)
Alkaline Phosphatase: 160 U/L — ABNORMAL HIGH (ref 38–126)
Anion gap: 11 (ref 5–15)
BUN: 24 mg/dL — ABNORMAL HIGH (ref 8–23)
CO2: 42 mmol/L — ABNORMAL HIGH (ref 22–32)
Calcium: 8.6 mg/dL — ABNORMAL LOW (ref 8.9–10.3)
Chloride: 89 mmol/L — ABNORMAL LOW (ref 98–111)
Creatinine, Ser: 0.98 mg/dL (ref 0.61–1.24)
GFR calc Af Amer: >60 mL/min (ref >60)
GFR calc non Af Amer: >60 mL/min (ref >60)
Glucose, Bld: 127 mg/dL — ABNORMAL HIGH (ref 70–99)
Potassium: 4 mmol/L (ref 3.5–5.1)
Sodium: 142 mmol/L (ref 135–145)
Total Bilirubin: 0.8 mg/dL (ref 0.3–1.2)
Total Protein: 6.6 g/dL (ref 6.5–8.1)

## 2019-06-12 LAB — BLOOD GAS, ARTERIAL
Acid-Base Excess: 21.5 mmol/L — ABNORMAL HIGH (ref 0.0–2.0)
Acid-Base Excess: 20.2 mmol/L — ABNORMAL HIGH (ref 0.0–2.0)
Bicarbonate: 50.3 mmol/L — ABNORMAL HIGH (ref 20.0–28.0)
Bicarbonate: 50.3 mmol/L — ABNORMAL HIGH (ref 20.0–28.0)
Delivery systems: POSITIVE
Delivery systems: POSITIVE
Expiratory PAP: 8
Expiratory PAP: 8
FIO2: 0.5
FIO2: 0.5
Inspiratory PAP: 16
Inspiratory PAP: 16
Mechanical Rate: 8
O2 Saturation: 98.1 %
O2 Saturation: 99.2 %
Patient temperature: 37
Patient temperature: 37
pCO2 arterial: 74 mmHg (ref 32.0–48.0)
pCO2 arterial: 85 mmHg (ref 32.0–48.0)
pH, Arterial: 7.44 (ref 7.350–7.450)
pH, Arterial: 7.38 (ref 7.350–7.450)
pO2, Arterial: 103 mmHg (ref 83.0–108.0)
pO2, Arterial: 146 mmHg — ABNORMAL HIGH (ref 83.0–108.0)

## 2019-06-12 LAB — HEMOGLOBIN A1C
Hgb A1c MFr Bld: 5.1 % (ref 4.8–5.6)
Mean Plasma Glucose: 99.67 mg/dL

## 2019-06-12 LAB — SARS CORONAVIRUS 2 (TAT 6-24 HRS): SARS Coronavirus 2: NEGATIVE

## 2019-06-12 LAB — TROPONIN I (HIGH SENSITIVITY): Troponin I (High Sensitivity): 34 ng/L — ABNORMAL HIGH (ref <18)

## 2019-06-12 LAB — BRAIN NATRIURETIC PEPTIDE: B Natriuretic Peptide: 731 pg/mL — ABNORMAL HIGH (ref 0.0–100.0)

## 2019-06-12 MED ORDER — PANTOPRAZOLE SODIUM 40 MG PO TBEC
40.0000 mg | DELAYED_RELEASE_TABLET | Freq: Every day | ORAL | Status: DC
  Administered 2019-06-12 – 2019-07-09 (×26): 40 mg via ORAL
  Filled 2019-06-12 (×26): qty 1

## 2019-06-12 MED ORDER — BALSALAZIDE DISODIUM 750 MG PO CAPS: 750.0000 mg | ORAL_CAPSULE | Freq: Three times a day (TID) | ORAL | Status: DC

## 2019-06-12 MED ORDER — ASPIRIN EC 81 MG PO TBEC
81.0000 mg | DELAYED_RELEASE_TABLET | Freq: Every day | ORAL | Status: DC
  Administered 2019-06-12 – 2019-06-24 (×13): 81 mg via ORAL
  Filled 2019-06-12 (×13): qty 1

## 2019-06-12 MED ORDER — ATORVASTATIN CALCIUM 20 MG PO TABS
80.0000 mg | ORAL_TABLET | Freq: Every day | ORAL | Status: DC
  Administered 2019-06-13 – 2019-07-08 (×24): 80 mg via ORAL
  Filled 2019-06-12 (×24): qty 4

## 2019-06-12 MED ORDER — SODIUM CHLORIDE 0.9 % IV SOLN
250.0000 mL | INTRAVENOUS | Status: DC | PRN
  Administered 2019-06-22: 250 mL via INTRAVENOUS

## 2019-06-12 MED ORDER — RIVAROXABAN 20 MG PO TABS
20.0000 mg | ORAL_TABLET | Freq: Every day | ORAL | Status: DC
  Administered 2019-06-12 – 2019-07-01 (×20): 20 mg via ORAL
  Filled 2019-06-12 (×20): qty 1

## 2019-06-12 MED ORDER — INSULIN DETEMIR 100 UNIT/ML ~~LOC~~ SOLN
9.0000 [IU] | Freq: Every day | SUBCUTANEOUS | Status: DC
  Administered 2019-06-13 – 2019-07-02 (×20): 9 [IU] via SUBCUTANEOUS
  Filled 2019-06-12 (×26): qty 0.09

## 2019-06-12 MED ORDER — FUROSEMIDE 10 MG/ML IJ SOLN
60.0000 mg | Freq: Two times a day (BID) | INTRAMUSCULAR | Status: DC
  Administered 2019-06-12 – 2019-06-18 (×12): 60 mg via INTRAVENOUS
  Filled 2019-06-12: qty 8
  Filled 2019-06-12: qty 6
  Filled 2019-06-12: qty 8
  Filled 2019-06-12 (×2): qty 6
  Filled 2019-06-12: qty 8
  Filled 2019-06-12 (×6): qty 6

## 2019-06-12 MED ORDER — CHLORHEXIDINE GLUCONATE CLOTH 2 % EX PADS
6.0000 | MEDICATED_PAD | Freq: Every day | CUTANEOUS | Status: DC
  Administered 2019-06-12: 6 via TOPICAL

## 2019-06-12 MED ORDER — FINASTERIDE 5 MG PO TABS
5.0000 mg | ORAL_TABLET | Freq: Every day | ORAL | Status: DC
  Administered 2019-06-12 – 2019-07-09 (×26): 5 mg via ORAL
  Filled 2019-06-12 (×26): qty 1

## 2019-06-12 MED ORDER — ONDANSETRON HCL 4 MG/2ML IJ SOLN
4.0000 mg | Freq: Four times a day (QID) | INTRAMUSCULAR | Status: DC | PRN
  Administered 2019-07-03: 4 mg via INTRAVENOUS

## 2019-06-12 MED ORDER — POTASSIUM CHLORIDE CRYS ER 10 MEQ PO TBCR
10.0000 meq | EXTENDED_RELEASE_TABLET | Freq: Every day | ORAL | Status: DC
  Administered 2019-06-12 – 2019-06-17 (×6): 10 meq via ORAL
  Filled 2019-06-12 (×6): qty 1

## 2019-06-12 MED ORDER — FUROSEMIDE 10 MG/ML IJ SOLN
40.0000 mg | Freq: Once | INTRAMUSCULAR | Status: AC
  Administered 2019-06-12: 40 mg via INTRAVENOUS
  Filled 2019-06-12: qty 4

## 2019-06-12 MED ORDER — GUAIFENESIN-DM 100-10 MG/5ML PO SYRP
10.0000 mL | ORAL_SOLUTION | ORAL | Status: DC | PRN
  Filled 2019-06-12: qty 10

## 2019-06-12 MED ORDER — ZOLPIDEM TARTRATE 5 MG PO TABS
5.0000 mg | ORAL_TABLET | Freq: Every evening | ORAL | Status: DC | PRN
  Filled 2019-06-12: qty 1

## 2019-06-12 MED ORDER — DM-GUAIFENESIN ER 30-600 MG PO TB12: 1.0000 | ORAL_TABLET | Freq: Two times a day (BID) | ORAL | Status: DC

## 2019-06-12 MED ORDER — CHLORHEXIDINE GLUCONATE CLOTH 2 % EX PADS: 6.0000 | MEDICATED_PAD | Freq: Every day | CUTANEOUS | Status: DC

## 2019-06-12 MED ORDER — ENOXAPARIN SODIUM 40 MG/0.4ML ~~LOC~~ SOLN: 40.0000 mg | SUBCUTANEOUS | Status: DC

## 2019-06-12 MED ORDER — SODIUM CHLORIDE 0.9 % IR SOLN: 1000.0000 mL | Freq: Two times a day (BID) | Status: DC

## 2019-06-12 MED ORDER — NIACIN ER (ANTIHYPERLIPIDEMIC) 500 MG PO TBCR
1000.0000 mg | EXTENDED_RELEASE_TABLET | Freq: Every day | ORAL | Status: DC
  Administered 2019-06-12 – 2019-07-09 (×26): 1000 mg via ORAL
  Filled 2019-06-12 (×28): qty 2

## 2019-06-12 MED ORDER — ALBUTEROL SULFATE HFA 108 (90 BASE) MCG/ACT IN AERS
1.0000 | INHALATION_SPRAY | Freq: Four times a day (QID) | RESPIRATORY_TRACT | Status: DC | PRN
  Filled 2019-06-12: qty 6.7

## 2019-06-12 MED ORDER — SODIUM CHLORIDE 0.9% FLUSH
3.0000 mL | Freq: Two times a day (BID) | INTRAVENOUS | Status: DC
  Administered 2019-06-13 – 2019-06-21 (×12): 3 mL via INTRAVENOUS

## 2019-06-12 MED ORDER — OCUVITE-LUTEIN PO CAPS
ORAL_CAPSULE | Freq: Every day | ORAL | Status: DC
  Administered 2019-06-13 – 2019-07-09 (×24): 1 via ORAL
  Filled 2019-06-12 (×27): qty 1

## 2019-06-12 MED ORDER — INSULIN ASPART 100 UNIT/ML ~~LOC~~ SOLN
0.0000 [IU] | Freq: Three times a day (TID) | SUBCUTANEOUS | Status: DC
  Administered 2019-06-13 (×2): 2 [IU] via SUBCUTANEOUS
  Administered 2019-06-14 – 2019-06-15 (×3): 1 [IU] via SUBCUTANEOUS
  Administered 2019-06-16: 13:00:00 2 [IU] via SUBCUTANEOUS
  Administered 2019-06-16 – 2019-06-17 (×2): 1 [IU] via SUBCUTANEOUS
  Administered 2019-06-18 – 2019-06-19 (×2): 2 [IU] via SUBCUTANEOUS
  Administered 2019-06-19 – 2019-06-28 (×10): 1 [IU] via SUBCUTANEOUS
  Administered 2019-06-28 – 2019-06-29 (×2): 2 [IU] via SUBCUTANEOUS
  Administered 2019-06-29 – 2019-06-30 (×3): 1 [IU] via SUBCUTANEOUS
  Administered 2019-06-30: 100 [IU] via SUBCUTANEOUS
  Administered 2019-07-01 – 2019-07-02 (×3): 1 [IU] via SUBCUTANEOUS
  Administered 2019-07-03 – 2019-07-04 (×2): 2 [IU] via SUBCUTANEOUS
  Administered 2019-07-04 – 2019-07-05 (×4): 1 [IU] via SUBCUTANEOUS
  Administered 2019-07-06: 17:00:00 2 [IU] via SUBCUTANEOUS
  Administered 2019-07-07 – 2019-07-08 (×2): 1 [IU] via SUBCUTANEOUS
  Filled 2019-06-12 (×38): qty 1

## 2019-06-12 MED ORDER — ALPRAZOLAM 0.25 MG PO TABS: 0.2500 mg | ORAL_TABLET | Freq: Two times a day (BID) | ORAL | Status: DC | PRN

## 2019-06-12 MED ORDER — FLORANEX PO PACK
1.0000 g | PACK | Freq: Three times a day (TID) | ORAL | Status: DC
  Administered 2019-06-13 – 2019-07-09 (×50): 1 g via ORAL
  Filled 2019-06-12 (×91): qty 1

## 2019-06-12 MED ORDER — ACETAMINOPHEN 325 MG PO TABS: 650.0000 mg | ORAL_TABLET | ORAL | Status: DC | PRN

## 2019-06-12 MED ORDER — SODIUM CHLORIDE 0.9% FLUSH: 3.0000 mL | INTRAVENOUS | Status: DC | PRN

## 2019-06-12 MED ORDER — NEPRO/CARBSTEADY PO LIQD
237.0000 mL | Freq: Two times a day (BID) | ORAL | Status: DC
  Administered 2019-06-13 – 2019-07-02 (×29): 237 mL via ORAL

## 2019-06-12 MED ORDER — FUROSEMIDE 10 MG/ML IJ SOLN
40.0000 mg | Freq: Two times a day (BID) | INTRAMUSCULAR | Status: DC
  Administered 2019-06-12: 40 mg via INTRAVENOUS
  Filled 2019-06-12: qty 4

## 2019-06-12 MED ORDER — LISINOPRIL 5 MG PO TABS: 2.5000 mg | ORAL_TABLET | Freq: Every day | ORAL | Status: DC

## 2019-06-12 MED ORDER — TAMSULOSIN HCL 0.4 MG PO CAPS
0.4000 mg | ORAL_CAPSULE | Freq: Every day | ORAL | Status: DC
  Administered 2019-06-12 – 2019-07-09 (×26): 0.4 mg via ORAL
  Filled 2019-06-12 (×26): qty 1

## 2019-06-12 MED ORDER — MIDODRINE HCL 5 MG PO TABS
10.0000 mg | ORAL_TABLET | Freq: Three times a day (TID) | ORAL | Status: DC
  Administered 2019-06-12 – 2019-06-21 (×29): 10 mg via ORAL
  Filled 2019-06-12 (×32): qty 2

## 2019-06-12 NOTE — H&P (Signed)
Colton Lamb NAME: Colton Lamb    MR#:  [id number]  DATE OF BIRTH:  Dec 04, 1950  DATE OF ADMISSION:  06/12/2019  PRIMARY CARE PHYSICIAN: Colton Pitch, MD   REQUESTING/REFERRING PHYSICIAN: Gonzella Lex, MD  CHIEF COMPLAINT:   Chief Complaint  Patient presents with  . Respiratory Distress    HISTORY OF PRESENT ILLNESS:  Colton Lamb  is a 68 y.o. obese Caucasian male with a known history of systolic CHF, coronary artery disease hypertension dyslipidemia and Crohn's disease, presented to the emergency room with acute onset of worsening dyspnea with associated dry cough and wheezing for the last 24 hours without fever or chills.  He he admitted to orthopnea and paroxysmal nocturnal dyspnea as well as dyspnea on exertion with lower extremity edema.  No fever or chills.  No nausea or vomiting.  No dysuria, oliguria or hematuria or flank pain.  No recent exposure to COVID-19.  He was tested negative about a week ago.  Upon presentation to the emergency room, his heart rate was 102 and respiratory to 34 with pulse oximetry of 85% on CPAP and later on 100% on BiPAP.  His BNP was 731 and high-sensitivity troponin I of 34.  CBC showed leukocytosis and anemia with neutrophilia and CMP showed a CO2 of 42 with a BUN of 24 and creatinine 0.98 alk phos of 160 with otherwise normal LFTs except for albumin 2.9.  Venous blood gas with a pH 7.38 and bicarbonate of 50.9.  COVID-19 test is currently pending.  Portable chest ray showed vascular congestion and small pleural effusions with bibasal atelectasis that are new compared to prior exam.  His AICD was in place.  EKG showed atrial fibrillation with a rapid rate of 110 with left intravesicular block and prolonged QT interval with QTC of 568 MS.  The patient was given 40 mg of IV Lasix.  He will be admitted to stepdown unit for further evaluation and management. PAST MEDICAL HISTORY:   Past Medical History:   Diagnosis Date  . Atrial flutter ([place])    a. s/p Cardioversion 11/22/13, on amiodarone and Xarelto.  . CHF (congestive heart failure) ([place])   . Chronic systolic heart failure ([place])    a. 10/2013 EF 20-25%, grade III DD, RV mildly dilated and sys fx mild/mod reduced;  b. 01/2014 Echo: EF 30-35%, gr3 DD, mod dil LA.  Marland Kitchen Coronary artery disease    a. s/p MI 2007/2015;  b. s/p prior PCI to the LAD/LCX/PDA/PL;  c. 2008: s/p Cypher DES to the OM.  Marland Kitchen Crohn's ileocolitis ([place])   . GERD (gastroesophageal reflux disease)   . Hx of adenomatous colonic polyps 11/2003  . Hyperlipidemia   . Hypertension   . Ischemic cardiomyopathy    s. 01/2014 s/p MDT [id number] [name] XT DR single lead AICD.  Marland Kitchen Obesity   . Paroxysmal atrial fibrillation ([facility])    a. CHA2DS2VASc = 4-->xarelto/amio.  . Sleep apnea   . Syncope    a.  11/2013 in setting of volume depletion and bradycardia due to dig toxicity   . Type II diabetes mellitus ([place])     PAST SURGICAL HISTORY:   Past Surgical History:  Procedure Laterality Date  . ATRIAL FLUTTER ABLATION N/A 04/16/2014   Procedure: ATRIAL FLUTTER ABLATION;  Surgeon: Colton Lance, MD;  Location: [facility] CATH LAB;  Service: Cardiovascular;  Laterality: N/A;  . CARDIAC CATHETERIZATION  10/2013  . CARDIAC DEFIBRILLATOR PLACEMENT  04/16/2014   Medtronic  Evira device  . CARDIAC ELECTROPHYSIOLOGY STUDY AND ABLATION  04/16/2014   atrial flutter ablation  . CARDIOVERSION N/A 03/05/2014   Procedure: CARDIOVERSION;  Surgeon: Colton Artist, MD;  Location: [facility] ENDOSCOPY;  Service: Cardiovascular;  Laterality: N/A;  . CATARACT EXTRACTION W/PHACO Right 01/04/2017   Procedure: CATARACT EXTRACTION PHACO AND INTRAOCULAR LENS PLACEMENT ([place])  Right Diabetic Complicated;  Surgeon: Colton Koyanagi, MD;  Location: [facility];  Service: Ophthalmology;  Laterality: Right;  Diabetic  . CATARACT EXTRACTION W/PHACO Left 02/08/2017   Procedure: CATARACT EXTRACTION PHACO AND INTRAOCULAR LENS  PLACEMENT ([place]) left diabetic;  Surgeon: Colton Koyanagi, MD;  Location: [place];  Service: Ophthalmology;  Laterality: Left;  Diabetic - oral meds sleep apnea  . CORONARY ANGIOPLASTY WITH STENT PLACEMENT  2007; 2008 X 2   "1+1 ~ 1"  . FOOT SURGERY Left    bone spur  . HYDROCELE EXCISION Bilateral   . 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 IMPLANTABLE CARDIOVERTER DEFIBRILLATOR IMPLANT N/A 04/16/2014   Procedure: IMPLANTABLE CARDIOVERTER DEFIBRILLATOR IMPLANT;  Surgeon: Colton Lance, MD;  Location: [facility] CATH LAB;  Service: Cardiovascular;  Laterality: N/A;  . LEFT HEART CATHETERIZATION WITH CORONARY ANGIOGRAM N/A 11/22/2013   Procedure: LEFT HEART CATHETERIZATION WITH CORONARY ANGIOGRAM;  Surgeon: Colton Grooms, MD;  Location: [facility] CATH LAB;  Service: Cardiovascular;  Laterality: N/A;    SOCIAL HISTORY:   Social History   Tobacco Use  . Smoking status: Never Smoker  . Smokeless tobacco: Never Used  Substance Use Topics  . Alcohol use: No    FAMILY HISTORY:   Family History  Problem Relation Age of Onset  . Breast cancer Mother   . Heart disease Father   . Heart attack Father   . Colon cancer Neg Hx     DRUG ALLERGIES:   Allergies  Allergen Reactions  . Iodine Other (See Comments)    Shortness of breath, swelling and hives  . Shrimp [Shellfish Allergy] Other (See Comments)    SWELLING    HIVES    SHORTNESS OF BREATH  . Tetracycline Rash    REVIEW OF SYSTEMS:   ROS As per history of present illness. All pertinent systems were reviewed above. Constitutional,  HEENT, cardiovascular, respiratory, GI, GU, musculoskeletal, neuro, psychiatric, endocrine,  integumentary and hematologic systems were reviewed and are otherwise  negative/unremarkable except for positive findings mentioned above in the HPI.   MEDICATIONS AT HOME:   Prior to Admission medications   Medication Sig Start Date End Date Taking? Authorizing  Provider  Albuterol Sulfate 108 (90 Base) MCG/ACT AEPB Inhale 1 puff into the lungs every 6 (six) hours as needed (shortness of breath).    Yes [provider]  aspirin 81 MG tablet Take 81 mg by mouth daily.    Yes [provider]  atorvastatin (LIPITOR) 80 MG tablet TAKE 1 TABLET BY MOUTH EVERY DAY Patient taking differently: Take 80 mg by mouth daily.  03/08/19  Yes Gollan, Kathlene November, MD  balsalazide (COLAZAL) 750 MG capsule TAKE 1 CAPSULE (750 MG TOTAL) BY MOUTH 3 (THREE) TIMES DAILY. Patient taking differently: Take 750 mg by mouth 3 (three) times daily.  11/28/18  Yes Ladene Artist, MD  finasteride (PROSCAR) 5 MG tablet Take 1 tablet (5 mg total) by mouth daily. 03/20/19  Yes Wieting, Richard, MD  insulin aspart (NOVOLOG) 100 UNIT/ML injection Inject 5 Units into the skin 3 (three) times daily with meals. Patient taking differently: Inject  5 Units into the skin 3 (three) times daily before meals.  03/19/19  Yes Wieting, Richard, MD  insulin detemir (LEVEMIR) 100 UNIT/ML injection Inject 0.13 mLs (13 Units total) into the skin daily. Patient taking differently: Inject 9 Units into the skin daily.  03/19/19  Yes Wieting, Richard, MD  lactobacillus (FLORANEX/LACTINEX) PACK Take 1 packet (1 g total) by mouth 3 (three) times daily with meals. 03/19/19  Yes Wieting, Richard, MD  midodrine (PROAMATINE) 10 MG tablet Take 1 tablet (10 mg total) by mouth 3 (three) times daily. 04/26/19  Yes Max Sane, MD  Multiple Vitamins-Minerals (DECUBI-VITE PO) Take by mouth daily.   Yes [provider]  niacin (NIASPAN) 1000 MG CR tablet Take 1,000 mg by mouth daily. 04/10/19  Yes [provider]  Nutritional Supplements (FEEDING SUPPLEMENT, NEPRO CARB STEADY,) LIQD Take 237 mLs by mouth 2 (two) times daily between meals. 03/19/19  Yes Wieting, Richard, MD  omeprazole (PRILOSEC) 20 MG capsule Take 20 mg by mouth daily.    Yes [provider]  potassium chloride (KLOR-CON) 10  MEQ tablet Take 1 tablet (10 mEq total) by mouth daily. 05/08/19 08/06/19 Yes Gollan, Kathlene November, MD  sodium hypochlorite (DAKIN'S 1/2 STRENGTH) external solution Irrigate with 1 application as directed 2 (two) times daily. (apply to sacral wound)   Yes [provider]  tamsulosin (FLOMAX) 0.4 MG CAPS capsule Take 1 capsule (0.4 mg total) by mouth daily after breakfast. 03/20/19  Yes Leslye Peer, Richard, MD  torsemide (DEMADEX) 20 MG tablet Take 2 tablets (40 mg) by mouth once daily 05/08/19  Yes Gollan, Kathlene November, MD  XARELTO 20 MG TABS tablet TAKE 1 TABLET BY MOUTH EVERY DAY WITH LUNCH 05/17/19  Yes Gollan, Kathlene November, MD  acetaminophen (TYLENOL) 325 MG tablet Take 1-2 tablets (325-650 mg total) by mouth every 4 (four) hours as needed for mild pain. 04/17/14   [name], NP  dextromethorphan-guaiFENesin [facility] DM) 30-600 MG 12hr tablet Take 1 tablet by mouth 2 (two) times daily.    [provider]  dextromethorphan-guaiFENesin (ROBITUSSIN-DM) 10-100 MG/5ML liquid Take 10 mLs by mouth every 4 (four) hours as needed for cough.    [provider]      VITAL SIGNS:  Blood pressure (!) 120/91, pulse (!) 102, resp. rate (!) 42, SpO2 (!) 85 %.  PHYSICAL EXAMINATION:  Physical Exam  GENERAL:  68 y.o.-year-old obese Caucasian male patient lying in the bed in mild respiratory distress on BiPAP EYES: Pupils equal, round, reactive to light and accommodation. No scleral icterus. Extraocular muscles intact.  HEENT: Head atraumatic, normocephalic. Oropharynx and nasopharynx clear.  NECK:  Supple, no jugular venous distention. No thyroid enlargement, no tenderness.  LUNGS: Diminished bibasal and midlung zone breath sounds with associated rales.  CARDIOVASCULAR: Regular rate and rhythm, S1, S2 normal. No murmurs, rubs, or gallops.  ABDOMEN: Soft, nondistended, nontender. Bowel sounds present. No organomegaly or mass.  EXTREMITIES: 1-2+ bilateral lower extremity pitting edema no  clubbing or cyanosis. NEUROLOGIC: Cranial nerves II through XII are intact. Muscle strength 5/5 in all extremities. Sensation intact. Gait not checked.  PSYCHIATRIC: The patient is alert and oriented x 3.  Normal affect and good eye contact. SKIN: No obvious rash, lesion, or ulcer.   LABORATORY PANEL:   CBC Recent Labs  Lab 06/12/19 0100  WBC 12.8*  HGB 9.4*  HCT 31.3*  PLT 364   ------------------------------------------------------------------------------------------------------------------  Chemistries  Recent Labs  Lab 06/12/19 0100  NA 142  K 4.0  CL 89*  CO2 42*  GLUCOSE 127*  BUN 24*  CREATININE 0.98  CALCIUM 8.6*  AST 27  ALT 15  ALKPHOS 160*  BILITOT 0.8   ------------------------------------------------------------------------------------------------------------------  Cardiac Enzymes No results for input(s): TROPONINI in the last 168 hours. ------------------------------------------------------------------------------------------------------------------  RADIOLOGY:  Dg Chest Portable 1 View  Result Date: 06/12/2019 CLINICAL DATA:  Respiratory distress EXAM: PORTABLE CHEST 1 VIEW COMPARISON:  05/18/2019 FINDINGS: Cardiac shadow remains enlarged. Defibrillator is again noted. Mild vascular congestion is again seen with likely posteriorly layering effusions. Mild bibasilar atelectatic changes are noted increased from the prior study. This may be related to a poor inspiratory effort. No bony abnormality is seen. IMPRESSION: Vascular congestion and small pleural effusions. Bibasilar atelectatic changes new from the prior exam. Electronically Signed   By: Inez Catalina M.D.   On: 06/12/2019 01:18      IMPRESSION AND PLAN:   1.  Acute on chronic systolic CHF with subsequent acute hypoxic and likely hypercarbic respiratory failure requiring BiPAP.  The patient will be admitted to a telemetry bed and will be diuresed with IV Lasix.  Will follow serial cardiac  enzymes.  Will obtain a cardiology consultation in a.m. by Dr. Johnsie Cancel with [facility] was notified about the patient.  Most recent 2D echo was/2020 revealed an EF of 35 to 40%.  2.  Type 2 diabetes mellitus.  We will place the patient on supplement coverage with NovoLog and continue basal coverage.  3.  Dyslipidemia.  Statin therapy will be resumed.  4.  Crohn's disease.  We will continue his balsalazide.  5.  BPH.  We will continue Proscar and Flomax.  6.  DVT prophylaxis.  Continue Xarelto.    All the records are reviewed and case discussed with ED provider. The plan of care was discussed in details with the patient (and family). I answered all questions. The patient agreed to proceed with the above mentioned plan. Further management will depend upon hospital course.   CODE STATUS: Full code  TOTAL CRITICAL CARE TIME TAKING CARE OF THIS PATIENT: 55 minutes.    Christel Mormon M.D on 06/12/2019 at 2:28 AM  [facility]   From 7 PM-7 AM, contact night-coverage [email or web address]  CC: Primary care physician; Colton Pitch, MD   Note: This dictation was prepared with Dragon dictation along with smaller phrase technology. Any transcriptional errors that result from this process are unintentional.

## 2019-06-12 NOTE — ED Provider Notes (Signed)
[facility] Emergency Department Provider Note  ____________________________________________  Time seen: Approximately 1:17 AM  I have reviewed the triage vital signs and the nursing notes.   HISTORY  Chief Complaint Respiratory Distress   HPI Colton Lamb is a 68 y.o. male with a history of CHF with a EF of 35 to 40%, status post AICD, atrial flutter on Xarelto, CAD status post stents, Crohn's disease, hypertension, hyperlipidemia, diabetes OSA who presents for difficulty breathing.  Symptoms started today.  Patient looks volume overloaded.  When EMS arrived to the nursing home patient was on 6 L nasal cannula satting in the upper 80s.  Has been taking his diuretics and urinating normal.  Has a Foley catheter in place.  He denies cough, fever, body aches, chest pain.  Shortness of breath is constant and moderate per patient, worse with minimal movement.  Patient was transported on CPAP with improvement of his sats to 100%.  Recently tested for Covid a week ago negative.  No personal or family history of blood clots, no recent travel immobilization, no leg pain or swelling, no hemoptysis or exogenous hormones.  Patient is on Xarelto.   Past Medical History:  Diagnosis Date   Atrial flutter ([place])    a. s/p Cardioversion 11/22/13, on amiodarone and Xarelto.   CHF (congestive heart failure) (HCC)    Chronic systolic heart failure ([place])    a. 10/2013 EF 20-25%, grade III DD, RV mildly dilated and sys fx mild/mod reduced;  b. 01/2014 Echo: EF 30-35%, gr3 DD, mod dil LA.   Coronary artery disease    a. s/p MI 2007/2015;  b. s/p prior PCI to the LAD/LCX/PDA/PL;  c. 2008: s/p Cypher DES to the OM.   Crohn's ileocolitis ([place])    GERD (gastroesophageal reflux disease)    Hx of adenomatous colonic polyps 11/2003   Hyperlipidemia    Hypertension    Ischemic cardiomyopathy    s. 01/2014 s/p MDT [id number] [name] XT DR single lead AICD.   Obesity    Paroxysmal  atrial fibrillation (HCC)    a. CHA2DS2VASc = 4-->xarelto/amio.   Sleep apnea    Syncope    a.  11/2013 in setting of volume depletion and bradycardia due to dig toxicity    Type II diabetes mellitus [facility])     Patient Active Problem List   Diagnosis Date Noted   Acute blood loss anemia    Pressure injury of skin 03/17/2019   Acute renal failure (ARF) (HCC)    Empyema lung (HCC)    Shortness of breath    Pleural effusion on right    Sepsis ([place]) 03/04/2019   Restless legs syndrome (RLS) 06/16/2017   Hypertension, essential 06/16/2017   Coronary artery disease of native artery of native heart with stable angina pectoris ([place]) 04/27/2017   Hydrocephalus ([place]) 09/08/2016   Dizziness and giddiness    Elevated troponin 09/06/2016   Type II diabetes mellitus ([place])    Ischemic cardiomyopathy    Hyponatremia 07/01/2015   Diabetes mellitus type 2, uncontrolled, with complications ([place]) 95/04/3266   ICD (implantable cardioverter-defibrillator) in place 05/27/2014   OSA (obstructive sleep apnea) 12/20/2013   Morbid obesity ([place]) 12/20/2013   AKI (acute kidney injury) - Creatinine improved at d/c 12/14/2013   Elevated TSH - will need f/u TFTs with PCP in 3-4 weeks 12/14/2013   Junctional bradycardia - resolved 12/14/2013   Syncope - due to bradycardia in setting of Digoxin Toxicity 12/45/8099   Chronic systolic heart  failure ([place]) 12/06/2013   At risk for sudden cardiac death - on LifeVest 2013/12/06   Acute combined systolic and diastolic CHF, NYHA class 3 -- 2/2 MI 12/06/2013   Cardiomyopathy, ischemic 06-Dec-2013   Atrial flutter ([place]) 11/22/2013   NSTEMI (non-ST elevated myocardial infarction) ([place]) 11/22/2013   Long term current use of anticoagulant 10/19/2010   Hyperlipidemia 05/06/2010   Edema 05/06/2010   CAD S/P percutaneous coronary angioplasty - multiple PCIs 11/11/2008   Atrial fibrillation ([place]) 11/11/2008   GERD 11/11/2008   CROHN'S  DISEASE-LARGE & SMALL INTESTINE 11/11/2008   COLONIC POLYPS, HX OF 11/11/2008   SHELLFISH ALLERGY 11/11/2008    Past Surgical History:  Procedure Laterality Date   ATRIAL FLUTTER ABLATION N/A 04/16/2014   Procedure: ATRIAL FLUTTER ABLATION;  Surgeon: Evans Lance, MD;  Location: [facility] CATH LAB;  Service: Cardiovascular;  Laterality: N/A;   CARDIAC CATHETERIZATION  10/2013   CARDIAC DEFIBRILLATOR PLACEMENT  04/16/2014   Medtronic Evira device   CARDIAC ELECTROPHYSIOLOGY STUDY AND ABLATION  04/16/2014   atrial flutter ablation   CARDIOVERSION N/A 03/05/2014   Procedure: CARDIOVERSION;  Surgeon: Jolaine Artist, MD;  Location: [place];  Service: Cardiovascular;  Laterality: N/A;   CATARACT EXTRACTION W/PHACO Right 01/04/2017   Procedure: CATARACT EXTRACTION PHACO AND INTRAOCULAR LENS PLACEMENT ([place])  Right Diabetic Complicated;  Surgeon: Leandrew Koyanagi, MD;  Location: [place];  Service: Ophthalmology;  Laterality: Right;  Diabetic   CATARACT EXTRACTION W/PHACO Left 02/08/2017   Procedure: CATARACT EXTRACTION PHACO AND INTRAOCULAR LENS PLACEMENT (IOC) left diabetic;  Surgeon: Leandrew Koyanagi, MD;  Location: [place];  Service: Ophthalmology;  Laterality: Left;  Diabetic - oral meds sleep apnea   CORONARY ANGIOPLASTY WITH STENT PLACEMENT  2007; 2008 X 2   "1+1 ~ 1"   FOOT SURGERY Left    bone spur   HYDROCELE EXCISION Bilateral    Ileocecal resection and sigmoid enterocolonic fistula repair  09/1998   IMPLANTABLE CARDIOVERTER DEFIBRILLATOR IMPLANT N/A 04/16/2014   Procedure: IMPLANTABLE CARDIOVERTER DEFIBRILLATOR IMPLANT;  Surgeon: Evans Lance, MD;  Location[facility] CATH LAB;  Service: Cardiovascular;  Laterality: N/A;   LEFT HEART CATHETERIZATION WITH CORONARY ANGIOGRAM N/A 11/22/2013   Procedure: LEFT HEART CATHETERIZATION WITH CORONARY ANGIOGRAM;  Surgeon: Sinclair Grooms, MD;  Location: [facility] CATH LAB;  Service: Cardiovascular;  Laterality: N/A;      Prior to Admission medications   Medication Sig Start Date End Date Taking? Authorizing Provider  Albuterol Sulfate 108 (90 Base) MCG/ACT AEPB Inhale 1 puff into the lungs every 6 (six) hours as needed (shortness of breath).    Yes [provider]  aspirin 81 MG tablet Take 81 mg by mouth daily.    Yes [provider]  atorvastatin (LIPITOR) 80 MG tablet TAKE 1 TABLET BY MOUTH EVERY DAY Patient taking differently: Take 80 mg by mouth daily.  03/08/19  Yes Gollan, Kathlene November, MD  balsalazide (COLAZAL) 750 MG capsule TAKE 1 CAPSULE (750 MG TOTAL) BY MOUTH 3 (THREE) TIMES DAILY. Patient taking differently: Take 750 mg by mouth 3 (three) times daily.  11/28/18  Yes Ladene Artist, MD  finasteride (PROSCAR) 5 MG tablet Take 1 tablet (5 mg total) by mouth daily. 03/20/19  Yes Wieting, Richard, MD  insulin aspart (NOVOLOG) 100 UNIT/ML injection Inject 5 Units into the skin 3 (three) times daily with meals. Patient taking differently: Inject 5 Units into the skin 3 (three) times daily before meals.  03/19/19  Yes Loletha Grayer, MD  insulin detemir (LEVEMIR) 100 UNIT/ML injection Inject 0.13 mLs (13 Units total) into the skin daily. Patient taking differently: Inject 9 Units into the skin daily.  03/19/19  Yes Wieting, Richard, MD  lactobacillus (FLORANEX/LACTINEX) PACK Take 1 packet (1 g total) by mouth 3 (three) times daily with meals. 03/19/19  Yes Wieting, Richard, MD  midodrine (PROAMATINE) 10 MG tablet Take 1 tablet (10 mg total) by mouth 3 (three) times daily. 04/26/19  Yes Max Sane, MD  Multiple Vitamins-Minerals (DECUBI-VITE PO) Take by mouth daily.   Yes [provider]  niacin (NIASPAN) 1000 MG CR tablet Take 1,000 mg by mouth daily. 04/10/19  Yes [provider]  Nutritional Supplements (FEEDING SUPPLEMENT, NEPRO CARB STEADY,) LIQD Take 237 mLs by mouth 2 (two) times daily between meals. 03/19/19  Yes Wieting, Richard, MD  omeprazole (PRILOSEC) 20 MG capsule  Take 20 mg by mouth daily.    Yes [provider]  potassium chloride (KLOR-CON) 10 MEQ tablet Take 1 tablet (10 mEq total) by mouth daily. 05/08/19 08/06/19 Yes Gollan, Kathlene November, MD  sodium hypochlorite (DAKIN'S 1/2 STRENGTH) external solution Irrigate with 1 application as directed 2 (two) times daily. (apply to sacral wound)   Yes [provider]  tamsulosin (FLOMAX) 0.4 MG CAPS capsule Take 1 capsule (0.4 mg total) by mouth daily after breakfast. 03/20/19  Yes Leslye Peer, Richard, MD  torsemide (DEMADEX) 20 MG tablet Take 2 tablets (40 mg) by mouth once daily 05/08/19  Yes Gollan, Kathlene November, MD  XARELTO 20 MG TABS tablet TAKE 1 TABLET BY MOUTH EVERY DAY WITH LUNCH 05/17/19  Yes Gollan, Kathlene November, MD  acetaminophen (TYLENOL) 325 MG tablet Take 1-2 tablets (325-650 mg total) by mouth every 4 (four) hours as needed for mild pain. 04/17/14   [name], NP  dextromethorphan-guaiFENesin [facility] DM) 30-600 MG 12hr tablet Take 1 tablet by mouth 2 (two) times daily.    [provider]  dextromethorphan-guaiFENesin (ROBITUSSIN-DM) 10-100 MG/5ML liquid Take 10 mLs by mouth every 4 (four) hours as needed for cough.    [provider]    Allergies Iodine, Shrimp [shellfish allergy], and Tetracycline  Family History  Problem Relation Age of Onset   Breast cancer Mother    Heart disease Father    Heart attack Father    Colon cancer Neg Hx     Social History Social History   Tobacco Use   Smoking status: Never Smoker   Smokeless tobacco: Never Used  Substance Use Topics   Alcohol use: No   Drug use: No    Review of Systems  Constitutional: Negative for fever. Eyes: Negative for visual changes. ENT: Negative for sore throat. Neck: No neck pain  Cardiovascular: Negative for chest pain. Respiratory: + shortness of breath. Gastrointestinal: Negative for abdominal pain, vomiting or diarrhea. Genitourinary: Negative for dysuria. Musculoskeletal:  Negative for back pain. + b/l leg swelling Skin: Negative for rash. Neurological: Negative for headaches, weakness or numbness. Psych: No SI or HI  ____________________________________________   PHYSICAL EXAM:  VITAL SIGNS: ED Triage Vitals [06/12/19 0055]  Enc Vitals Group     BP 113/89     Pulse Rate (!) 102     Resp (!) 34     Temp      Temp src      SpO2 (!) 85 %     Weight      Height      Head Circumference      [facility] Flow  Pain Score      Pain Loc      Pain Edu?      Excl. in [place]?     Constitutional: Alert and oriented, moderate respiratory distress. HEENT:      Head: Normocephalic and atraumatic.         Eyes: Conjunctivae are normal. Sclera is non-icteric.       Mouth/Throat: Mucous membranes are moist.       Neck: Supple with no signs of meningismus. Cardiovascular: Tachycardic with regular rhythm. Respiratory: Increased work of breathing, severely diminished air movement bilaterally with bilateral crackles  gastrointestinal: Soft, non tender, and non distended with positive bowel sounds. No rebound or guarding. Musculoskeletal: 2+ pitting edema  neurologic: Normal speech and language. Face is symmetric. Moving all extremities. No gross focal neurologic deficits are appreciated. Skin: Skin is warm, dry and intact. No rash noted. Psychiatric: Mood and affect are normal. Speech and behavior are normal.  ____________________________________________   LABS (all labs ordered are listed, but only abnormal results are displayed)  Labs Reviewed  CBC WITH DIFFERENTIAL/PLATELET - Abnormal; Notable for the following components:      Result Value   WBC 12.8 (*)    RBC 3.68 (*)    Hemoglobin 9.4 (*)    HCT 31.3 (*)    MCH 25.5 (*)    RDW 16.9 (*)    Neutro Abs 10.9 (*)    All other components within normal limits  COMPREHENSIVE METABOLIC PANEL - Abnormal; Notable for the following components:   Chloride 89 (*)    CO2 42 (*)    Glucose, Bld 127 (*)    BUN  24 (*)    Calcium 8.6 (*)    Albumin 2.9 (*)    Alkaline Phosphatase 160 (*)    All other components within normal limits  BRAIN NATRIURETIC PEPTIDE - Abnormal; Notable for the following components:   B Natriuretic Peptide 731.0 (*)    All other components within normal limits  BLOOD GAS, VENOUS - Abnormal; Notable for the following components:   pCO2, Ven 86 (*)    pO2, Ven <31.0 (*)    Bicarbonate 50.9 (*)    Acid-Base Excess 20.7 (*)    All other components within normal limits  TROPONIN I (HIGH SENSITIVITY) - Abnormal; Notable for the following components:   Troponin I (High Sensitivity) 34 (*)    All other components within normal limits  SARS CORONAVIRUS 2 (TAT 6-24 HRS)   ____________________________________________  EKG  Atrial fibrillation, rate of 110, prolonged QTC, anterior Q waves, no ST elevations or depressions, diffuse T wave flattening.  Unchanged from prior. ____________________________________________  RADIOLOGY  I have personally reviewed the images performed during this visit and I agree with the Radiologist's read.   Interpretation by Radiologist:  Dg Chest Portable 1 View  Result Date: 06/12/2019 CLINICAL DATA:  Respiratory distress EXAM: PORTABLE CHEST 1 VIEW COMPARISON:  05/18/2019 FINDINGS: Cardiac shadow remains enlarged. Defibrillator is again noted. Mild vascular congestion is again seen with likely posteriorly layering effusions. Mild bibasilar atelectatic changes are noted increased from the prior study. This may be related to a poor inspiratory effort. No bony abnormality is seen. IMPRESSION: Vascular congestion and small pleural effusions. Bibasilar atelectatic changes new from the prior exam. Electronically Signed   By: Inez Catalina M.D.   On: 06/12/2019 01:18     ____________________________________________   PROCEDURES  Procedure(s) performed: None Procedures Critical Care performed: yes  CRITICAL CARE Performed by: Rudene Re  ?  Total critical care time: 35 min  Critical care time was exclusive of separately billable procedures and treating other patients.  Critical care was necessary to treat or prevent imminent or life-threatening deterioration.  Critical care was time spent personally by me on the following activities: development of treatment plan with patient and/or surrogate as well as nursing, discussions with consultants, evaluation of patient's response to treatment, examination of patient, obtaining history from patient or surrogate, ordering and performing treatments and interventions, ordering and review of laboratory studies, ordering and review of radiographic studies, pulse oximetry and re-evaluation of patient's condition.  ____________________________________________   INITIAL IMPRESSION / ASSESSMENT AND PLAN / ED COURSE   68 y.o. male with a history of CHF with a EF of 35 to 40%, status post AICD, atrial flutter on Xarelto, CAD status post stents, Crohn's disease, hypertension, hyperlipidemia, diabetes OSA who presents for difficulty breathing.  Patient arrives in moderate respiratory distress, hypoxic on 6 L nasal cannula, currently on CPAP.  Patient was transitioned immediately to BiPAP.  Looks volume overloaded with elevated JVD, bilateral crackles, 2+ pitting edema.  No fever or cough with a negative Covid swab a week ago.  No chest pain.  EKG showed A. fib with rate of 110 and no acute ischemic changes.  Differential diagnosis includes CHF exacerbation versus pneumonia versus Covid versus ACS.  PE considered but thought to be less likely since patient is on Xarelto.  Plan for labs, chest x-ray, Covid swab, IV diuresis.  Will reassess for disposition    _________________________ 1:49 AM on 06/12/2019 -----------------------------------------  Work-up consistent with CHF exacerbation.  Patient looks more comfortable on BiPAP and being diuresed.  Will admit to the  hospitalist.    As part of my medical decision making, I reviewed the following data within the [place] notes reviewed and incorporated, Labs reviewed , EKG interpreted , Old EKG reviewed, Old chart reviewed, Radiograph reviewed , Discussed with admitting physician , Notes from prior ED visits and [place] Controlled Substance Database   Patient was evaluated in Emergency Department today for the symptoms described in the history of present illness. Patient was evaluated in the context of the global COVID-19 pandemic, which necessitated consideration that the patient might be at risk for infection with the SARS-CoV-2 virus that causes COVID-19. Institutional protocols and algorithms that pertain to the evaluation of patients at risk for COVID-19 are in a state of rapid change based on information released by regulatory bodies including the CDC and federal and state organizations. These policies and algorithms were followed during the patient's care in the ED.   ____________________________________________   FINAL CLINICAL IMPRESSION(S) / ED DIAGNOSES   Final diagnoses:  Acute respiratory failure with hypoxia and hypercapnia (HCC)  Acute on chronic congestive heart failure, unspecified heart failure type ([place])      NEW MEDICATIONS STARTED DURING THIS VISIT:  ED Discharge Orders    None       Note:  This document was prepared using Dragon voice recognition software and may include unintentional dictation errors.    Alfred Levins, [place], MD 06/12/19 [phone]

## 2019-06-12 NOTE — ED Triage Notes (Signed)
Pt arrived via EMS from [facility] due to respiratory distress. Pt was admitted to hospital x2 weeks ago for pneumonia, released on 2L O2 back to [facility]. Pt tonight had O2 reading on 4L in 80s. Pt arrived on CPap. Pt A&O x4, able to answer questions asked. Pt placed on BiPap per MD at bedside.

## 2019-06-12 NOTE — ED Notes (Signed)
Conversation with pt about need to keep him on bipap due to his respiratory status and eating lunch. Pt stated at he was not hungry at this time. I told him to hit the call bell when he was hungry and we would see what we could do.

## 2019-06-12 NOTE — ED Notes (Addendum)
Pt with existing unstageable sacral ulcer, baseball size with tunneling, draining thick tan drainage. Attending notified via secure text

## 2019-06-12 NOTE — Progress Notes (Signed)
68 year old obese Caucasian male with known history of systolic CHF, CAD, hypertension, hyperlipidemia Crohn's disease admitted a few hours ago for acute respiratory failure consistent with CHF exacerbation.  Physical exam 97.8, heart rate 104, respirations 21, blood pressure 131/93, 100% on BiPAP Gen: white obese male Cardiovascular: Sinus tachycardia Respiratory: Rales bilaterally, rhonchi bilaterally Abdomen soft protuberant nontender Extremities warm well perfused, edema bilaterally 1-2+ pitting  Assessment and plan:  CHF exac systolic EF 31-28 Continue with aggressive diuresis Lasix adjusted by cardiology Monitor electrolytes Diagnosis Daily weights  A Fib: Cont xarelto avoidng bb to allow for bp for diuresis  CAD C/w aspirin, lipitor, holding bp meds for diuresis  OSA: CPAP PRN

## 2019-06-12 NOTE — Consult Note (Signed)
Cardiology Consultation:   Patient ID: Colton Lamb MRN: [id number]; DOB: 1950-11-23  Admit date: 06/12/2019 Date of Consult: 06/12/2019  Primary Care Provider: [name], MD Primary Cardiologist: Ida Rogue, MD  Primary Electrophysiologist:  None    Patient Profile:   Colton Lamb is a 68 y.o. male with a hx of atrial flutter/paroxysmal afib, heart failure reduced ejection fraction, last EF 35 to 40%, CAD OSA, who is being seen today for the evaluation of acute systolic heart failure at the request of Dr. Sidney Ace.  History of Present Illness:   Current history is limited by condition of patient.  Patient is currently on the BiPAP machine.  Colton Lamb is a 68 year old male with history of CAD/PCI x3 (to LAD, LCx, PDA), HFrEF EF 35-40%, atrial flutter status post ablation, DDD ICD, diabetes, paroxysmal atrial fibrillation on Xarelto, hypertension, hyperlipidemia, obstructive sleep apnea who presents due to acute onset of shortness of breath.  Patient states having worsening shortness of breath today.  He lives in a nursing home.  He has been taking his diuretics as prescribed.  Due to symptoms, EMS was called.  Upon EMS arrival, patient was on 6 L nasal oxygen and satting in the 80s.  He denies cough body aches, chest pain or fever.  In the emergency room, patient was placed on CPAP with improvement of oxygen saturation to 100%.  EKG in the emergency room shows atrial fibrillation with heart rate 110.  Chest x-ray showed vascular congestion and effusion.  Patient was given an IV push of Lasix 40 mg x 1.  And continued on Lasix 40 mg twice daily.  He is currently on CPAP machine.  Heart Pathway Score:     Past Medical History:  Diagnosis Date  . Atrial flutter ([place])    a. s/p Cardioversion 11/22/13, on amiodarone and Xarelto.  . CHF (congestive heart failure) ([place])   . Chronic systolic heart failure ([place])    a. 10/2013 EF 20-25%, grade III DD, RV mildly dilated and sys  fx mild/mod reduced;  b. 01/2014 Echo: EF 30-35%, gr3 DD, mod dil LA.  Marland Kitchen Coronary artery disease    a. s/p MI 2007/2015;  b. s/p prior PCI to the LAD/LCX/PDA/PL;  c. 2008: s/p Cypher DES to the OM.  Marland Kitchen Crohn's ileocolitis ([place])   . GERD (gastroesophageal reflux disease)   . Hx of adenomatous colonic polyps 11/2003  . Hyperlipidemia   . Hypertension   . Ischemic cardiomyopathy    s. 01/2014 s/p MDT [id number] [name] XT DR single lead AICD.  Marland Kitchen Obesity   . Paroxysmal atrial fibrillation ([facility])    a. CHA2DS2VASc = 4-->xarelto/amio.  . Sleep apnea   . Syncope    a.  11/2013 in setting of volume depletion and bradycardia due to dig toxicity   . Type II diabetes mellitus ([place])     Past Surgical History:  Procedure Laterality Date  . ATRIAL FLUTTER ABLATION N/A 04/16/2014   Procedure: ATRIAL FLUTTER ABLATION;  Surgeon: Evans Lance, MD;  Location: [facility] CATH LAB;  Service: Cardiovascular;  Laterality: N/A;  . CARDIAC CATHETERIZATION  10/2013  . CARDIAC DEFIBRILLATOR PLACEMENT  04/16/2014   Medtronic Evira device  . CARDIAC ELECTROPHYSIOLOGY STUDY AND ABLATION  04/16/2014   atrial flutter ablation  . CARDIOVERSION N/A 03/05/2014   Procedure: CARDIOVERSION;  Surgeon: Jolaine Artist, MD;  Location: [facility] ENDOSCOPY;  Service: Cardiovascular;  Laterality: N/A;  . CATARACT EXTRACTION W/PHACO Right 01/04/2017   Procedure: CATARACT EXTRACTION PHACO AND INTRAOCULAR  LENS PLACEMENT (IOC)  Right Diabetic Complicated;  Surgeon: Leandrew Koyanagi, MD;  Location: [place];  Service: Ophthalmology;  Laterality: Right;  Diabetic  . CATARACT EXTRACTION W/PHACO Left 02/08/2017   Procedure: CATARACT EXTRACTION PHACO AND INTRAOCULAR LENS PLACEMENT ([place]) left diabetic;  Surgeon: Leandrew Koyanagi, MD;  Location: [place];  Service: Ophthalmology;  Laterality: Left;  Diabetic - oral meds sleep apnea  . CORONARY ANGIOPLASTY WITH STENT PLACEMENT  2007; 2008 X 2   "1+1 ~ 1"  . FOOT SURGERY Left    bone  spur  . HYDROCELE EXCISION Bilateral   . 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 IMPLANTABLE CARDIOVERTER DEFIBRILLATOR IMPLANT N/A 04/16/2014   Procedure: IMPLANTABLE CARDIOVERTER DEFIBRILLATOR IMPLANT;  Surgeon: Evans Lance, MD;  Location: [facility] CATH LAB;  Service: Cardiovascular;  Laterality: N/A;  . LEFT HEART CATHETERIZATION WITH CORONARY ANGIOGRAM N/A 11/22/2013   Procedure: LEFT HEART CATHETERIZATION WITH CORONARY ANGIOGRAM;  Surgeon: Sinclair Grooms, MD;  Location: [facility] CATH LAB;  Service: Cardiovascular;  Laterality: N/A;     Home Medications:  Prior to Admission medications   Medication Sig Start Date End Date Taking? Authorizing Provider  Albuterol Sulfate 108 (90 Base) MCG/ACT AEPB Inhale 1 puff into the lungs every 6 (six) hours as needed (shortness of breath).    Yes [provider]  aspirin 81 MG tablet Take 81 mg by mouth daily.    Yes [provider]  atorvastatin (LIPITOR) 80 MG tablet TAKE 1 TABLET BY MOUTH EVERY DAY Patient taking differently: Take 80 mg by mouth daily.  03/08/19  Yes Gollan, Kathlene November, MD  balsalazide (COLAZAL) 750 MG capsule TAKE 1 CAPSULE (750 MG TOTAL) BY MOUTH 3 (THREE) TIMES DAILY. Patient taking differently: Take 750 mg by mouth 3 (three) times daily.  11/28/18  Yes Ladene Artist, MD  finasteride (PROSCAR) 5 MG tablet Take 1 tablet (5 mg total) by mouth daily. 03/20/19  Yes Wieting, Richard, MD  insulin aspart (NOVOLOG) 100 UNIT/ML injection Inject 5 Units into the skin 3 (three) times daily with meals. Patient taking differently: Inject 5 Units into the skin 3 (three) times daily before meals.  03/19/19  Yes Wieting, Richard, MD  insulin detemir (LEVEMIR) 100 UNIT/ML injection Inject 0.13 mLs (13 Units total) into the skin daily. Patient taking differently: Inject 9 Units into the skin daily.  03/19/19  Yes Wieting, Richard, MD  lactobacillus (FLORANEX/LACTINEX) PACK Take 1 packet (1 g total) by mouth 3  (three) times daily with meals. 03/19/19  Yes Wieting, Richard, MD  midodrine (PROAMATINE) 10 MG tablet Take 1 tablet (10 mg total) by mouth 3 (three) times daily. 04/26/19  Yes Max Sane, MD  Multiple Vitamins-Minerals (DECUBI-VITE PO) Take by mouth daily.   Yes [provider]  niacin (NIASPAN) 1000 MG CR tablet Take 1,000 mg by mouth daily. 04/10/19  Yes [provider]  Nutritional Supplements (FEEDING SUPPLEMENT, NEPRO CARB STEADY,) LIQD Take 237 mLs by mouth 2 (two) times daily between meals. 03/19/19  Yes Wieting, Richard, MD  omeprazole (PRILOSEC) 20 MG capsule Take 20 mg by mouth daily.    Yes [provider]  potassium chloride (KLOR-CON) 10 MEQ tablet Take 1 tablet (10 mEq total) by mouth daily. 05/08/19 08/06/19 Yes Gollan, Kathlene November, MD  sodium hypochlorite (DAKIN'S 1/2 STRENGTH) external solution Irrigate with 1 application as directed 2 (two) times daily. (apply to sacral wound)   Yes [provider]  tamsulosin (FLOMAX) 0.4 MG  CAPS capsule Take 1 capsule (0.4 mg total) by mouth daily after breakfast. 03/20/19  Yes Leslye Peer, Richard, MD  torsemide (DEMADEX) 20 MG tablet Take 2 tablets (40 mg) by mouth once daily 05/08/19  Yes Gollan, Kathlene November, MD  XARELTO 20 MG TABS tablet TAKE 1 TABLET BY MOUTH EVERY DAY WITH LUNCH 05/17/19  Yes Gollan, Kathlene November, MD  acetaminophen (TYLENOL) 325 MG tablet Take 1-2 tablets (325-650 mg total) by mouth every 4 (four) hours as needed for mild pain. 04/17/14   [name], NP  dextromethorphan-guaiFENesin [facility] DM) 30-600 MG 12hr tablet Take 1 tablet by mouth 2 (two) times daily.    [provider]  dextromethorphan-guaiFENesin (ROBITUSSIN-DM) 10-100 MG/5ML liquid Take 10 mLs by mouth every 4 (four) hours as needed for cough.    [provider]    Inpatient Medications: Scheduled Meds: . aspirin EC  81 mg Oral Daily  . atorvastatin  80 mg Oral Daily  . balsalazide  750 mg Oral TID  . Decubi-Vite    Oral Daily  . feeding supplement (NEPRO CARB STEADY)  237 mL Oral BID BM  . finasteride  5 mg Oral Daily  . furosemide  40 mg Intravenous Q12H  . insulin aspart  0-9 Units Subcutaneous TID WC  . insulin detemir  9 Units Subcutaneous Daily  . lactobacillus  1 g Oral TID WC  . lisinopril  2.5 mg Oral Daily  . midodrine  10 mg Oral TID  . niacin  1,000 mg Oral Daily  . pantoprazole  40 mg Oral Daily  . potassium chloride  10 mEq Oral Daily  . rivaroxaban  20 mg Oral Daily  . sodium chloride flush  3 mL Intravenous Q12H  . sodium chloride irrigation  1,000 mL Irrigation BID  . tamsulosin  0.4 mg Oral QPC breakfast   Continuous Infusions: . sodium chloride     PRN Meds: sodium chloride, acetaminophen, albuterol, ALPRAZolam, guaiFENesin-dextromethorphan, ondansetron (ZOFRAN) IV, sodium chloride flush, zolpidem  Allergies:    Allergies  Allergen Reactions  . Iodine Other (See Comments)    Shortness of breath, swelling and hives  . Shrimp [Shellfish Allergy] Other (See Comments)    SWELLING    HIVES    SHORTNESS OF BREATH  . Tetracycline Rash    Social History:   Social History   Socioeconomic History  . Marital status: Widowed    Spouse name: Not on file  . Number of children: 1  . Years of education: Not on file  . Highest education level: Not on file  Occupational History  . Occupation: retired    Fish farm manager: RETIRED  Social Needs  . Financial resource strain: Not hard at all  . Food insecurity    Worry: Never true    Inability: Never true  . Transportation needs    Medical: No    Non-medical: No  Tobacco Use  . Smoking status: Never Smoker  . Smokeless tobacco: Never Used  Substance and Sexual Activity  . Alcohol use: No  . Drug use: No  . Sexual activity: Never  Lifestyle  . Physical activity    Days per week: 0 days    Minutes per session: 0 min  . Stress: Not on file  Relationships  . Social Herbalist on phone: Never    Gets together: Never     Attends religious service: Never    Active member of club or organization: Yes    Attends meetings of clubs  or organizations: 1 to 4 times per year    Relationship status: Never married  . Intimate partner violence    Fear of current or ex partner: No    Emotionally abused: Not on file    Physically abused: No    Forced sexual activity: No  Other Topics Concern  . Not on file  Social History Narrative  . Not on file    Family History:    Family History  Problem Relation Age of Onset  . Breast cancer Mother   . Heart disease Father   . Heart attack Father   . Colon cancer Neg Hx      ROS:  Please see the history of present illness.   All other ROS reviewed and negative.     Physical Exam/Data:   Vitals:   06/12/19 0800 06/12/19 0830 06/12/19 0900 06/12/19 0931  BP: 115/76 (!) 119/92 122/87   Pulse: 97 77 95 (!) 104  Resp: (!) 34 (!) 29 (!) 32 (!) 24  Temp:      TempSrc:      SpO2: 100% 100% 100%    No intake or output data in the 24 hours ending 06/12/19 0951 Last 3 Weights 06/10/2019 06/05/2019 05/18/2019  Weight (lbs) 244 lb 252 lb 270 lb  Weight (kg) 110.678 kg 114.306 kg 122.471 kg     There is no height or weight on file to calculate BMI.  General:  Well nourished, well developed, mild respiratory distress HEENT: normal Lymph: no adenopathy Neck: Body habitus makes JVD hard to assess Endocrine:  No thryomegaly Vascular: No carotid bruits; FA pulses 2+ bilaterally without bruits  Cardiac: Distant heart sounds, irregular irregular. Lungs: Vented breath sounds, crackles at bases noted. Abd: soft, nontender, obese Ext: Trace lower extremity edema Musculoskeletal:  No deformities, BUE and BLE strength normal and equal Skin: warm and dry  Neuro:  CNs 2-12 intact, no focal abnormalities noted Psych:  Normal affect   EKG:  The EKG was personally reviewed and demonstrates: Atrial fibrillation, heart rate 110. Telemetry:  Telemetry was personally reviewed and  demonstrates: Atrial fibrillation, heart rate 108.  Relevant CV Studies: TTE 02/08/2019 1. The left ventricle has moderately reduced systolic function, with an ejection fraction of 35-40%. The cavity size was normal. Left ventricular diastolic Doppler parameters are indeterminate. Probably severe hypokinesis of mid-distal anterior,  ateroseptal and apical myocardium.  2. The right ventricle has normal systolic function. The cavity was normal. There is no increase in right ventricular wall thickness.  3. Left atrial size was moderately dilated.  4. Very poor image quality in spite of using Definity. Valves not well visualized. Consider different diagnostic testing if indicated.  5. The aortic valve was not well visualized. Aortic valve regurgitation was not assessed by color flow Doppler  Laboratory Data:  High Sensitivity Troponin:   Recent Labs  Lab 05/18/19 1536 05/18/19 1755 06/12/19 0100  TROPONINIHS 16 15 34*     Chemistry Recent Labs  Lab 06/12/19 0100  NA 142  K 4.0  CL 89*  CO2 42*  GLUCOSE 127*  BUN 24*  CREATININE 0.98  CALCIUM 8.6*  GFRNONAA >60  GFRAA >60  ANIONGAP 11    Recent Labs  Lab 06/12/19 0100  PROT 6.6  ALBUMIN 2.9*  AST 27  ALT 15  ALKPHOS 160*  BILITOT 0.8   Hematology Recent Labs  Lab 06/12/19 0100 06/12/19 0642  WBC 12.8* 14.9*  RBC 3.68* 3.49*  HGB 9.4* 8.8*  HCT 31.3*  29.4*  MCV 85.1 84.2  MCH 25.5* 25.2*  MCHC 30.0 29.9*  RDW 16.9* 17.0*  PLT 364 337   BNP Recent Labs  Lab 06/12/19 0100  BNP 731.0*    DDimer No results for input(s): DDIMER in the last 168 hours.   Radiology/Studies:  Dg Chest Portable 1 View  Result Date: 06/12/2019 CLINICAL DATA:  Respiratory distress EXAM: PORTABLE CHEST 1 VIEW COMPARISON:  05/18/2019 FINDINGS: Cardiac shadow remains enlarged. Defibrillator is again noted. Mild vascular congestion is again seen with likely posteriorly layering effusions. Mild bibasilar atelectatic changes are  noted increased from the prior study. This may be related to a poor inspiratory effort. No bony abnormality is seen. IMPRESSION: Vascular congestion and small pleural effusions. Bibasilar atelectatic changes new from the prior exam. Electronically Signed   By: Inez Catalina M.D.   On: 06/12/2019 01:18    Assessment and Plan:  68 year old male with history of heart failure reduced ejection fraction presenting with acute shortness of breath and hypoxia.  Found to have pulmonary vascular congestion on chest x-ray.  Hypoxia improved with CPAP.  Creatinine normal.  1. HFrEF  EF 35-40% Increase Lasix to 60 mg twice daily Strict ins and outs, daily weights Monitor creatinine with diuresing. Holding other heart failure medications BB, ACE-I/ARB to give blood pressure room for diuresing. Replete lytes to keep K over 4, mag over 2. Patient has history of orthostasis/hypotension.  Continue PTA midodrine Further recommendations pending clinical course.  2.  Paroxysmal atrial fibrillation -Heart rate is reasonably controlled.  Continue Xarelto 20 mg daily. Avoiding beta-blockers for now as above.  3.  History of CAD/PCI -Aspirin 81 mg, Lipitor 80.  4.  OSA -Recommend CPAP nightly.  Signed, Kate Sable, MD  06/12/2019 9:51 AM

## 2019-06-12 NOTE — ED Notes (Addendum)
Pt turned onto right side down. Pt's brief changed and pt is now clean and dry. Will continue to turn onto other sides.

## 2019-06-12 NOTE — ED Notes (Signed)
Report accepted and given to Rison, Therapist, sports.

## 2019-06-12 NOTE — Progress Notes (Signed)
Patient admitted to ICU-14 from ED on Bipap. Patient hooked up to monitor, vital signs stable. Report received from Judson Roch, [place]. Bipap removed, placed on nasal cannula with o2 sat 99%. Patient has an unstageable sacral injury with a wet to dry dressing and an active order to wound management. See flowsheets for more details, will continue to monitor.

## 2019-06-12 NOTE — ED Notes (Signed)
Pt turned onto left side down at this time. Pt clean and dry and new wet to dry dressing was applied to sacrum.

## 2019-06-12 NOTE — ED Notes (Signed)
Attempted to call report. ICU RN declined report; RT called to re-evaluate pt. [facility] and ED Charge nurse notified

## 2019-06-13 ENCOUNTER — Ambulatory Visit: Payer: Medicare Other | Admitting: Nurse Practitioner

## 2019-06-13 ENCOUNTER — Inpatient Hospital Stay: Payer: Medicare Other

## 2019-06-13 DIAGNOSIS — I4819 Other persistent atrial fibrillation: Secondary | ICD-10-CM

## 2019-06-13 DIAGNOSIS — I420 Dilated cardiomyopathy: Secondary | ICD-10-CM

## 2019-06-13 DIAGNOSIS — I509 Heart failure, unspecified: Secondary | ICD-10-CM | POA: Diagnosis not present

## 2019-06-13 DIAGNOSIS — I2510 Atherosclerotic heart disease of native coronary artery without angina pectoris: Secondary | ICD-10-CM | POA: Diagnosis not present

## 2019-06-13 LAB — GLUCOSE, CAPILLARY
Glucose-Capillary: 82 mg/dL (ref 70–99)
Glucose-Capillary: 78 mg/dL (ref 70–99)
Glucose-Capillary: 102 mg/dL — ABNORMAL HIGH (ref 70–99)
Glucose-Capillary: 178 mg/dL — ABNORMAL HIGH (ref 70–99)
Glucose-Capillary: 160 mg/dL — ABNORMAL HIGH (ref 70–99)
Glucose-Capillary: 165 mg/dL — ABNORMAL HIGH (ref 70–99)

## 2019-06-13 LAB — MRSA PCR SCREENING: MRSA by PCR: NEGATIVE

## 2019-06-13 LAB — CBC WITH DIFFERENTIAL/PLATELET
Abs Immature Granulocytes: 0.04 10*3/uL (ref 0.00–0.07)
Basophils Absolute: 0.1 10*3/uL (ref 0.0–0.1)
Basophils Relative: 1 %
Eosinophils Absolute: 0.2 10*3/uL (ref 0.0–0.5)
Eosinophils Relative: 2 %
HCT: 29.3 % — ABNORMAL LOW (ref 39.0–52.0)
Hemoglobin: 8.7 g/dL — ABNORMAL LOW (ref 13.0–17.0)
Immature Granulocytes: 0 %
Lymphocytes Relative: 9 %
Lymphs Abs: 1 10*3/uL (ref 0.7–4.0)
MCH: 24.9 pg — ABNORMAL LOW (ref 26.0–34.0)
MCHC: 29.7 g/dL — ABNORMAL LOW (ref 30.0–36.0)
MCV: 84 fL (ref 80.0–100.0)
Monocytes Absolute: 0.6 10*3/uL (ref 0.1–1.0)
Monocytes Relative: 6 %
Neutro Abs: 9.1 10*3/uL — ABNORMAL HIGH (ref 1.7–7.7)
Neutrophils Relative %: 82 %
Platelets: 340 10*3/uL (ref 150–400)
RBC: 3.49 MIL/uL — ABNORMAL LOW (ref 4.22–5.81)
RDW: 17.1 % — ABNORMAL HIGH (ref 11.5–15.5)
WBC: 11.1 10*3/uL — ABNORMAL HIGH (ref 4.0–10.5)
nRBC: 0 % (ref 0.0–0.2)

## 2019-06-13 LAB — BASIC METABOLIC PANEL
Anion gap: 11 (ref 5–15)
BUN: 23 mg/dL (ref 8–23)
CO2: 39 mmol/L — ABNORMAL HIGH (ref 22–32)
Calcium: 8.2 mg/dL — ABNORMAL LOW (ref 8.9–10.3)
Chloride: 90 mmol/L — ABNORMAL LOW (ref 98–111)
Creatinine, Ser: 0.92 mg/dL (ref 0.61–1.24)
GFR calc Af Amer: >60 mL/min (ref >60)
GFR calc non Af Amer: >60 mL/min (ref >60)
Glucose, Bld: 117 mg/dL — ABNORMAL HIGH (ref 70–99)
Potassium: 3.3 mmol/L — ABNORMAL LOW (ref 3.5–5.1)
Sodium: 140 mmol/L (ref 135–145)

## 2019-06-13 LAB — MAGNESIUM: Magnesium: 1.8 mg/dL (ref 1.7–2.4)

## 2019-06-13 MED ORDER — RACEPINEPHRINE HCL 2.25 % IN NEBU
INHALATION_SOLUTION | RESPIRATORY_TRACT | Status: AC
  Filled 2019-06-13: qty 0.5

## 2019-06-13 MED ORDER — POTASSIUM CHLORIDE CRYS ER 20 MEQ PO TBCR
40.0000 meq | EXTENDED_RELEASE_TABLET | Freq: Three times a day (TID) | ORAL | Status: AC
  Administered 2019-06-13 (×2): 40 meq via ORAL
  Filled 2019-06-13 (×2): qty 2

## 2019-06-13 MED ORDER — CHLORHEXIDINE GLUCONATE CLOTH 2 % EX PADS
6.0000 | MEDICATED_PAD | Freq: Every day | CUTANEOUS | Status: DC
  Administered 2019-06-14 – 2019-07-09 (×23): 6 via TOPICAL

## 2019-06-13 MED ORDER — MAGNESIUM SULFATE 2 GM/50ML IV SOLN
2.0000 g | Freq: Once | INTRAVENOUS | Status: AC
  Administered 2019-06-13: 2 g via INTRAVENOUS
  Filled 2019-06-13: qty 50

## 2019-06-13 NOTE — Progress Notes (Signed)
PROGRESS NOTE    Colton Lamb  WCB:[id number] DOB: June 03, 1951 DOA: 06/12/2019 PCP: [name], MD   Brief Narrative:  Colton Lamb  is a 68 y.o. obese Caucasian male with a known history of systolic CHF, coronary artery disease hypertension dyslipidemia and Crohn's disease, presented to the emergency room with acute onset of worsening dyspnea with associated dry cough and wheezing for the last 24 hours without fever or chills.  He he admitted to orthopnea and paroxysmal nocturnal dyspnea as well as dyspnea on exertion with lower extremity edema.  No fever or chills.  No nausea or vomiting.  No dysuria, oliguria or hematuria or flank pain.  No recent exposure to COVID-19.  He was tested negative about a week ago.   Assessment & Plan:   Active Problems:   Coronary artery disease involving native coronary artery of native heart without angina pectoris   Acute respiratory failure ([place])   1.  Acute on chronic systolic CHF with subsequent acute hypoxic and likely hypercarbic respiratory failure requiring BiPAP.   -weaned of bipap -c/w diuresis -appreciate cards consult -strcit I/o's  2.  Type 2 diabetes mellitus.    Today with sliding scale insulin, check blood glucose AC at bedtime, diabetic diet  3.  Dyslipidemia.    Lipitor 80 mg daily  4.  Crohn's disease.  balsalazide.  5.  BPH. continue Proscar and Flomax.  6. Stage 4 sacrum decubiti POA; wound care consult appreciated, continue with Aquacel, Kerlix, ABD pad and change daily, agree with repositioning every 2 hours  DVT prophylaxis: OH:[id number]   Code Status: Full code    Code Status Orders  (From admission, onward)         Start     Ordered   06/12/19 0351  Full code  Continuous     06/12/19 0359        Code Status History    Date Active Date Inactive Code Status Order ID Comments User Context   04/21/2019 1857 04/26/2019 2045 Full Code [id number]  Demetrios Loll, MD Inpatient   03/04/2019 2021 03/19/2019 2042  Full Code [id number]  [name], MD Inpatient   09/07/2016 0100 09/09/2016 2001 Full Code [id number]  Harvie Bridge, DO Inpatient   04/16/2014 1552 04/17/2014 1512 Full Code [id number]  Evans Lance, MD Inpatient   12/11/2013 2042 12/14/2013 1516 Full Code [id number]  Eileen Stanford, PA-C Inpatient   11/22/2013 1434 11/30/2013 2000 Full Code [id number]  Belva Crome, MD Inpatient   11/22/2013 0916 11/22/2013 1434 Full Code [id number]  Dorothy Spark, MD Inpatient   11/22/2013 0829 11/22/2013 0916 Full Code [id number]  Barrett, Evelene Croon, PA-C Inpatient   Advance Care Planning Activity     Family Communication: NONE TODAY  Disposition Plan:   Patient remained inpatient for continued IV diuresis, expert subspecialty consultation, electrolyte replacement and wound care. Consults called: None Admission status: Inpatient   Consultants:   Cardiology, wound care  Procedures:  Dg Chest Port 1 View  Result Date: 06/13/2019 CLINICAL DATA:  CHF. EXAM: PORTABLE CHEST 1 VIEW COMPARISON:  06/12/2019. FINDINGS: Cardiac pacer noted stable position. Cardiomegaly. Bibasilar atelectasis. Diffuse bilateral pulmonary infiltrates/edema and bilateral pleural effusions. Degenerative change thoracic spine. IMPRESSION: Cardiac pacer in stable position. Cardiomegaly. Diffuse bilateral pulmonary infiltrates/edema and bilateral pleural effusions. Bibasilar atelectasis. Similar findings noted on prior exam. Electronically Signed   By: [place]   On: 06/13/2019 09:12   Dg Chest Portable 1 View  Result Date: 06/12/2019 CLINICAL DATA:  Respiratory  distress EXAM: PORTABLE CHEST 1 VIEW COMPARISON:  05/18/2019 FINDINGS: Cardiac shadow remains enlarged. Defibrillator is again noted. Mild vascular congestion is again seen with likely posteriorly layering effusions. Mild bibasilar atelectatic changes are noted increased from the prior study. This may be related to a poor inspiratory effort. No bony abnormality is seen.  IMPRESSION: Vascular congestion and small pleural effusions. Bibasilar atelectatic changes new from the prior exam. Electronically Signed   By: Inez Catalina M.D.   On: 06/12/2019 01:18   Dg Chest Portable 1 View  Result Date: 05/18/2019 CLINICAL DATA:  68 year old male with shortness of breath and altered mental status. EXAM: PORTABLE CHEST 1 VIEW COMPARISON:  Chest radiograph dated 04/30/2019 FINDINGS: There is moderate cardiomegaly with vascular congestion. Small bilateral pleural effusions and bibasilar atelectasis. Pneumonia is not excluded. Clinical correlation is recommended. Overall similar appearance of the cardiomediastinal silhouette and vascular congestion and edema to the radiograph of 04/30/2019. Right upper lobe opacity, similar to prior radiograph and likely combination of pleural effusion and associated atelectasis. There is no pneumothorax. Atherosclerotic calcification of the aortic arch. Left pectoral AICD device. No acute osseous pathology. IMPRESSION: Cardiomegaly with vascular congestion and bilateral pleural effusions overall similar to the radiograph of 04/30/2019. Electronically Signed   By: Anner Crete M.D.   On: 05/18/2019 15:58     Antimicrobials:   None   Subjective: No acute events overnight, Respiratory status improving weaned off BiPAP  Objective: Vitals:   06/13/19 1100 06/13/19 1200 06/13/19 1300 06/13/19 1400  BP: (!) 88/70 93/65 (!) 80/59 94/77  Pulse: 91 (!) 107 87 (!) 103  Resp: 19 15 (!) 35 (!) 25  Temp:    97.6 F (36.4 C)  TempSrc:    Oral  SpO2: 100% 100% 99% 98%  Weight:      Height:        Intake/Output Summary (Last 24 hours) at 06/13/2019 1542 Last data filed at 06/13/2019 1401 Gross per 24 hour  Intake 820 ml  Output 100 ml  Net 720 ml   Filed Weights   06/13/19 0400  Weight: 119.4 kg    Examination:  General exam: Appears calm and comfortable  Respiratory system: Rales bilaterally, rhonchi bilaterally, no accessory  muscle use. Cardiovascular system: S1 & S2 heard, RRR. No JVD, murmurs, rubs, gallops or clicks. No pedal edema. Gastrointestinal system: Abdomen is nondistended, soft and nontender. No organomegaly or masses felt. Normal bowel sounds heard. Central nervous system: Alert and oriented. No focal neurological deficits. Extremities: Well perfused, neurovascularly intact, 2+ pitting edema Skin: Stage IV sacral ulcer, Psychiatry: Judgement and insight appear normal. Mood & affect appropriate.     Data Reviewed: I have personally reviewed following labs and imaging studies  CBC: Recent Labs  Lab 06/12/19 0100 06/12/19 0642 06/13/19 0619  WBC 12.8* 14.9* 11.1*  NEUTROABS 10.9* 12.8* 9.1*  HGB 9.4* 8.8* 8.7*  HCT 31.3* 29.4* 29.3*  MCV 85.1 84.2 84.0  PLT 364 337 021   Basic Metabolic Panel: Recent Labs  Lab 06/12/19 0100 06/13/19 0619  NA 142 140  K 4.0 3.3*  CL 89* 90*  CO2 42* 39*  GLUCOSE 127* 117*  BUN 24* 23  CREATININE 0.98 0.92  CALCIUM 8.6* 8.2*  MG  --  1.8   GFR: Estimated Creatinine Clearance: 102.5 mL/min (by C-G formula based on SCr of 0.92 mg/dL). Liver Function Tests: Recent Labs  Lab 06/12/19 0100  AST 27  ALT 15  ALKPHOS 160*  BILITOT 0.8  PROT 6.6  ALBUMIN 2.9*   No results for input(s): LIPASE, AMYLASE in the last 168 hours. No results for input(s): AMMONIA in the last 168 hours. Coagulation Profile: No results for input(s): INR, PROTIME in the last 168 hours. Cardiac Enzymes: No results for input(s): CKTOTAL, CKMB, CKMBINDEX, TROPONINI in the last 168 hours. BNP (last 3 results) No results for input(s): PROBNP in the last 8760 hours. HbA1C: Recent Labs    06/12/19 0642  HGBA1C 5.1   CBG: Recent Labs  Lab 06/12/19 1841 06/12/19 2238 06/13/19 0325 06/13/19 0721 06/13/19 1140  GLUCAP 87 82 78 102* 178*   Lipid Profile: No results for input(s): CHOL, HDL, LDLCALC, TRIG, CHOLHDL, LDLDIRECT in the last 72 hours. Thyroid Function  Tests: No results for input(s): TSH, T4TOTAL, FREET4, T3FREE, THYROIDAB in the last 72 hours. Anemia Panel: No results for input(s): VITAMINB12, FOLATE, FERRITIN, TIBC, IRON, RETICCTPCT in the last 72 hours. Sepsis Labs: No results for input(s): PROCALCITON, LATICACIDVEN in the last 168 hours.  Recent Results (from the past 240 hour(s))  SARS CORONAVIRUS 2 (TAT 6-24 HRS) Nasopharyngeal Nasopharyngeal Swab     Status: None   Collection Time: 06/12/19  1:00 AM   Specimen: Nasopharyngeal Swab  Result Value Ref Range Status   SARS Coronavirus 2 NEGATIVE NEGATIVE Final    Comment: (NOTE) SARS-CoV-2 target nucleic acids are NOT DETECTED. The SARS-CoV-2 RNA is generally detectable in upper and lower respiratory specimens during the acute phase of infection. Negative results do not preclude SARS-CoV-2 infection, do not rule out co-infections with other pathogens, and should not be used as the sole basis for treatment or other patient management decisions. Negative results must be combined with clinical observations, patient history, and epidemiological information. The expected result is Negative. Fact Sheet for Patients: SugarRoll.be Fact Sheet for Healthcare Providers: https://www.woods-mathews.com/ This test is not yet approved or cleared by the [place] FDA and  has been authorized for detection and/or diagnosis of SARS-CoV-2 by FDA under an Emergency Use Authorization (EUA). This EUA will remain  in effect (meaning this test can be used) for the duration of the COVID-19 declaration under Section 56 4(b)(1) of the Act, 21 U.S.C. section 360bbb-3(b)(1), unless the authorization is terminated or revoked sooner. Performed at [facility], [place] [address], [place], [place] [address]   MRSA PCR Screening     Status: None   Collection Time: 06/12/19 11:00 PM   Specimen: Nasopharyngeal  Result Value Ref Range Status   MRSA by PCR  NEGATIVE NEGATIVE Final    Comment:        The GeneXpert MRSA Assay (FDA approved for NASAL specimens only), is one component of a comprehensive MRSA colonization surveillance program. It is not intended to diagnose MRSA infection nor to guide or monitor treatment for MRSA infections. Performed at [facility], [address] [place], [place] [address]          Radiology Studies: Dg Chest [facility] 1 View  Result Date: 06/13/2019 CLINICAL DATA:  CHF. EXAM: PORTABLE CHEST 1 VIEW COMPARISON:  06/12/2019. FINDINGS: Cardiac pacer noted stable position. Cardiomegaly. Bibasilar atelectasis. Diffuse bilateral pulmonary infiltrates/edema and bilateral pleural effusions. Degenerative change thoracic spine. IMPRESSION: Cardiac pacer in stable position. Cardiomegaly. Diffuse bilateral pulmonary infiltrates/edema and bilateral pleural effusions. Bibasilar atelectasis. Similar findings noted on prior exam. Electronically Signed   By: [place]   On: 06/13/2019 09:12   Dg Chest Portable 1 View  Result Date: 06/12/2019 CLINICAL DATA:  Respiratory distress EXAM: PORTABLE CHEST 1  VIEW COMPARISON:  05/18/2019 FINDINGS: Cardiac shadow remains enlarged. Defibrillator is again noted. Mild vascular congestion is again seen with likely posteriorly layering effusions. Mild bibasilar atelectatic changes are noted increased from the prior study. This may be related to a poor inspiratory effort. No bony abnormality is seen. IMPRESSION: Vascular congestion and small pleural effusions. Bibasilar atelectatic changes new from the prior exam. Electronically Signed   By: Inez Catalina M.D.   On: 06/12/2019 01:18        Scheduled Meds: . aspirin EC  81 mg Oral Daily  . atorvastatin  80 mg Oral Daily  . balsalazide  750 mg Oral TID  . Chlorhexidine Gluconate Cloth  6 each Topical Q0600  . feeding supplement (NEPRO CARB STEADY)  237 mL Oral BID BM  . finasteride  5 mg Oral Daily  . furosemide  60  mg Intravenous Q12H  . insulin aspart  0-9 Units Subcutaneous TID WC  . insulin detemir  9 Units Subcutaneous Daily  . lactobacillus  1 g Oral TID WC  . midodrine  10 mg Oral TID  . multivitamin-lutein   Oral Daily  . niacin  1,000 mg Oral Daily  . pantoprazole  40 mg Oral Daily  . potassium chloride  10 mEq Oral Daily  . potassium chloride  40 mEq Oral TID  . rivaroxaban  20 mg Oral Daily  . sodium chloride flush  3 mL Intravenous Q12H  . tamsulosin  0.4 mg Oral QPC breakfast   Continuous Infusions: . sodium chloride    . magnesium sulfate bolus IVPB       LOS: 1 day    Time spent: 65 MIN    Nicolette Bang, MD [facility]  If 7PM-7AM, please contact night-coverage  06/13/2019, 3:42 PM

## 2019-06-13 NOTE — Progress Notes (Signed)
Patient is currently followed by [facility] community [facility] program at [facility]. CSW Annamaria Boots made aware. Flo Shanks BSN, RN, [place] [phone]

## 2019-06-13 NOTE — Progress Notes (Signed)
Patient with urine soaked incontinent pads with chronic foley in place.  Bladder scan completed and revealed 529m of urine in bladder.  Attempted to irrigate foley with no success.  Dr. SWyonia Houghnotified and order obtained to remove foley and place new foley.

## 2019-06-13 NOTE — Consult Note (Signed)
[facility] for Electrolyte Monitoring and Replacement   Vontrell Pullman is a 28 YOM with a hx of CHF, CAD, HTN, dyslipidemia, and Crohn's Disease who presented to the ED with acute onset of worsening dyspnea associated with dry cough and wheezing. He was placed on BiPAP and admitted to stepdown for further evaluation.  Pharmacy has been consulted for electrolyte monitoring and replacement.  Recent Labs: Potassium (mmol/L)  Date Value  06/13/2019 3.3 (L)  11/22/2013 3.7   Magnesium (mg/dL)  Date Value  04/30/2019 1.8  11/22/2013 1.8   Calcium (mg/dL)  Date Value  06/13/2019 8.2 (L)   Calcium, Total (mg/dL)  Date Value  11/22/2013 9.0   Albumin (g/dL)  Date Value  06/12/2019 2.9 (L)  04/18/2018 4.3   Phosphorus (mg/dL)  Date Value  04/21/2019 4.8 (H)   Sodium (mmol/L)  Date Value  06/13/2019 140  05/09/2016 141  11/22/2013 133 (L)   Corrected Calcium: 9.9 mg/dL  Assessment/Plan: - Potassium 40 mEq PO x 2. - Magnesium 2g IV x 1 - No other electrolytes warrant supplementation at this time.  - Will check BMP + Mg with AM labs and adjust as necessary.  Goals of Therapy:  - K ~ 4 - Mg ~ 2 - Other electrolytes WNL   Thank you for allowing pharmacy to be a part of this patient's care.   Raiford Simmonds, PharmD Candidate 06/13/2019 12:45 PM

## 2019-06-13 NOTE — Plan of Care (Signed)
  Problem: Education: Goal: Knowledge of General Education information will improve Description: Including pain rating scale, medication(s)/side effects and non-pharmacologic comfort measures Outcome: Progressing   Problem: Clinical Measurements: Goal: Will remain free from infection Outcome: Progressing Goal: Diagnostic test results will improve Outcome: Progressing Goal: Respiratory complications will improve Outcome: Progressing Note: Patient weaned off bipap and placed on nasal cannula   Problem: Elimination: Goal: Will not experience complications related to bowel motility Outcome: Progressing Goal: Will not experience complications related to urinary retention Outcome: Progressing Note: Foley catheter in place   Problem: Safety: Goal: Ability to remain free from injury will improve Outcome: Progressing   Problem: Activity: Goal: Risk for activity intolerance will decrease Outcome: Not Met (add Reason)   Problem: Nutrition: Goal: Adequate nutrition will be maintained Outcome: Not Met (add Reason) Note: Patient denies having any hunger, states he has had a decreased appetite   Problem: Skin Integrity: Goal: Risk for impaired skin integrity will decrease Outcome: Not Met (add Reason) Note: Patient has an unstageable pressure injury on his sacrum, wound consult placed and following

## 2019-06-13 NOTE — Evaluation (Signed)
Physical Therapy Evaluation Patient Details Name: DEANDRE BRANNAN MRN: [id number] DOB: 05/31/1951 Today's Date: 06/13/2019   History of Present Illness  presented to ER secondary to respiratory distress; admitted for management of acute hypoxic/hypercarbic respiratory failure (requiring BiPAP at initial presentation).  Of note, patient with large, chronic stage IV sacral decubitus ulcer.  Clinical Impression  Patient sleeping upon arrival to room, but easily awakens to voice and light touch.  Agreeable to participation with session.  Oriented to self, location and general situation; follows simple commands and is cooperative with assessment.  Bilat UE/LEs generally weak and deconditioned (grossly 3 to 4-/5 throughout); LEs with significant pitting edema (4+).  Fair participation with supine therex, demonstrating active movement against gravity at all joints; assist from therapist for movement throughout full range.  Able to complete rolling (to R) with min/mod assist for full pelvic/body rotation; heavy use of bedrails to assist.  Nursing at bedside for hygiene (noted with incontinent BM) and wound care; additional mobility or OOB attempts deferred as result.  Will continue to assess/progress in subsequent sessions as medically appropriate. Would benefit from skilled PT to address above deficits and promote optimal return to PLOF.; recommend transition to STR upon discharge from acute hospitalization.     Follow Up Recommendations SNF    Equipment Recommendations       Recommendations for Other Services       Precautions / Restrictions Precautions Precautions: Fall Restrictions Weight Bearing Restrictions: No      Mobility  Bed Mobility Overal bed mobility: Needs Assistance Bed Mobility: Rolling Rolling: Min assist;Mod assist         General bed mobility comments: Rolling R, heavy use of bedrails  Transfers                 General transfer comment: deferred secondary  to hygiene, wound care needs  Ambulation/Gait             General Gait Details: deferred secondary to hygiene, wound care needs  Stairs            Wheelchair Mobility    Modified Rankin (Stroke Patients Only)       Balance                                             Pertinent Vitals/Pain Pain Assessment: No/denies pain    Home Living Family/patient expects to be discharged to:: Skilled nursing facility                      Prior Function           Comments: Prior to hospitalization 03/2019 pt was independent with occasional use of walking stick for longer distances; has been at [facility] since hospital discharge and reports recent use of hoyer lift for transfers at facility, still working with PT     Hand Dominance        Extremity/Trunk Assessment   Upper Extremity Assessment Upper Extremity Assessment: Overall WFL for tasks assessed(grossly 4-/5 throughout)    Lower Extremity Assessment Lower Extremity Assessment: Generalized weakness(grossly 3 to 4-/5 throughout; generally edematous (+4 pitting edema))       Communication   Communication: No difficulties  Cognition Arousal/Alertness: Lethargic Behavior During Therapy: Flat affect Overall Cognitive Status: No family/caregiver present to determine baseline cognitive functioning  General Comments: oriented to self, location; follows commands; intermittently closes eyes, drifts to sleep during session      General Comments      Exercises Other Exercises Other Exercises: Supine LE therex, 1x10, act assist ROM: ankle pumps, quad sets, SAQs, heel slides (with resisted extension) and hip abduct/adduct.  Fair/good ability to isolate and initiate LE muscle activation.   Assessment/Plan    PT Assessment Patient needs continued PT services  PT Problem List Decreased strength;Decreased activity tolerance;Decreased balance;Decreased  mobility;Decreased coordination;Decreased cognition;Decreased knowledge of use of DME;Decreased safety awareness;Decreased knowledge of precautions;Cardiopulmonary status limiting activity;Decreased skin integrity;Pain       PT Treatment Interventions DME instruction;Gait training;Functional mobility training;Therapeutic activities;Therapeutic exercise;Balance training;Cognitive remediation;Patient/family education    PT Goals (Current goals can be found in the Care Plan section)  Acute Rehab PT Goals PT Goal Formulation: Patient unable to participate in goal setting Time For Goal Achievement: 06/27/19 Potential to Achieve Goals: Fair    Frequency Min 2X/week   Barriers to discharge Decreased caregiver support      Co-evaluation               AM-PAC PT "6 Clicks" Mobility  Outcome Measure Help needed turning from your back to your side while in a flat bed without using bedrails?: A Lot Help needed moving from lying on your back to sitting on the side of a flat bed without using bedrails?: A Lot Help needed moving to and from a bed to a chair (including a wheelchair)?: Total Help needed standing up from a chair using your arms (e.g., wheelchair or bedside chair)?: Total Help needed to walk in hospital room?: Total Help needed climbing 3-5 steps with a railing? : Total 6 Click Score: 8    End of Session   Activity Tolerance: Patient tolerated treatment well Patient left: in bed;with call bell/phone within reach;with nursing/sitter in room Nurse Communication: Mobility status PT Visit Diagnosis: Muscle weakness (generalized) (M62.81);Difficulty in walking, not elsewhere classified (R26.2)    Time: 8315-1761 PT Time Calculation (min) (ACUTE ONLY): 15 min   Charges:   PT Evaluation $PT Eval Moderate Complexity: 1 Mod PT Treatments $Therapeutic Exercise: 8-22 mins        Atlas Kuc H. Owens Shark, PT, DPT, NCS 06/13/19, 4:50 PM [phone]

## 2019-06-13 NOTE — Consult Note (Signed)
WOC Nurse Consult Note: Reason for Consult: Chronic nonhealing stage 4 pressure injury to sacrum.  Seen at wound care center 05/31/19 and was ordered daily Dakins. Albumin is 2.9.   Wound type:stage 4 pressure injury Pressure Injury POA: Yes Measurement: 7 cm x 7 cm x 4 cm with tunneling from 1-4, extends 1 cm Wound bed:50% devitalized tissue, 20 % tendon and bone and 30% pale pink nongranulating tissue.  Drainage (amount, consistency, odor) moderate serosanguinous with necrotic odor Periwound: Moist, friable tissue. Dressing procedure/placement/frequency: Cleanse sacral wound with NS and pat dry.  Apply barrier cream to periwound skin.  Apply Aquacel Ag to wound bed.  Fill wound depth with NS moist fluffed kerlix  Cover with ABD pad and tape.  Change daily. Turn and reposition every two hours.  If admitted to medical floor, please order a mattress with low air loss replacement.   Will not follow at this time.  Please re-consult if needed.  Domenic Moras MSN, RN, FNP-BC CWON Wound, Ostomy, Continence Nurse Pager [phone]

## 2019-06-13 NOTE — Progress Notes (Signed)
Progress Note  Patient Name: Colton Lamb Date of Encounter: 06/13/2019  Primary Cardiologist: Rockey Situ  Subjective   Dyspnea about the same. No angina or palpitations. Documented UOP of 310 mL for the past 24 hours and net + 170 mL for the admission. No weight today. Renal function stable. Potassium trending to 3.3. HGB low, though stable at 8.7.  Inpatient Medications    Scheduled Meds: . aspirin EC  81 mg Oral Daily  . atorvastatin  80 mg Oral Daily  . balsalazide  750 mg Oral TID  . Chlorhexidine Gluconate Cloth  6 each Topical Q0600  . feeding supplement (NEPRO CARB STEADY)  237 mL Oral BID BM  . finasteride  5 mg Oral Daily  . furosemide  60 mg Intravenous Q12H  . insulin aspart  0-9 Units Subcutaneous TID WC  . insulin detemir  9 Units Subcutaneous Daily  . lactobacillus  1 g Oral TID WC  . midodrine  10 mg Oral TID  . multivitamin-lutein   Oral Daily  . niacin  1,000 mg Oral Daily  . pantoprazole  40 mg Oral Daily  . potassium chloride  10 mEq Oral Daily  . potassium chloride  40 mEq Oral TID  . rivaroxaban  20 mg Oral Daily  . sodium chloride flush  3 mL Intravenous Q12H  . tamsulosin  0.4 mg Oral QPC breakfast   Continuous Infusions: . sodium chloride    . magnesium sulfate bolus IVPB     PRN Meds: sodium chloride, acetaminophen, albuterol, ALPRAZolam, guaiFENesin-dextromethorphan, ondansetron (ZOFRAN) IV, sodium chloride flush, zolpidem   Vital Signs    Vitals:   06/13/19 1100 06/13/19 1200 06/13/19 1300 06/13/19 1400  BP: (!) 88/70 93/65 (!) 80/59 94/77  Pulse: 91 (!) 107 87 (!) 103  Resp: 19 15 (!) 35 (!) 25  Temp:    97.6 F (36.4 C)  TempSrc:    Oral  SpO2: 100% 100% 99% 98%  Weight:      Height:        Intake/Output Summary (Last 24 hours) at 06/13/2019 1549 Last data filed at 06/13/2019 1401 Gross per 24 hour  Intake 820 ml  Output 100 ml  Net 720 ml   Filed Weights   06/13/19 0400  Weight: 119.4 kg    Telemetry    Afib -  Personally Reviewed  ECG    No new tracings - Personally Reviewed  Physical Exam   GEN: No acute distress.   Neck: No JVD. Cardiac: Irregularly irregular, no murmurs, rubs, or gallops.  Respiratory: Diminished breath sounds bilaterally.  GI: Soft, nontender, non-distended.   MS: Trace bilateral pre-tibial edema; No deformity. Neuro:  Alert and oriented x 3; Nonfocal.  Psych: Normal affect.  Labs    Chemistry Recent Labs  Lab 06/12/19 0100 06/13/19 0619  NA 142 140  K 4.0 3.3*  CL 89* 90*  CO2 42* 39*  GLUCOSE 127* 117*  BUN 24* 23  CREATININE 0.98 0.92  CALCIUM 8.6* 8.2*  PROT 6.6  --   ALBUMIN 2.9*  --   AST 27  --   ALT 15  --   ALKPHOS 160*  --   BILITOT 0.8  --   GFRNONAA >60 >60  GFRAA >60 >60  ANIONGAP 11 11     Hematology Recent Labs  Lab 06/12/19 0100 06/12/19 0642 06/13/19 0619  WBC 12.8* 14.9* 11.1*  RBC 3.68* 3.49* 3.49*  HGB 9.4* 8.8* 8.7*  HCT 31.3* 29.4* 29.3*  MCV 85.1 84.2 84.0  MCH 25.5* 25.2* 24.9*  MCHC 30.0 29.9* 29.7*  RDW 16.9* 17.0* 17.1*  PLT 364 337 340    Cardiac EnzymesNo results for input(s): TROPONINI in the last 168 hours. No results for input(s): TROPIPOC in the last 168 hours.   BNP Recent Labs  Lab 06/12/19 0100  BNP 731.0*     DDimer No results for input(s): DDIMER in the last 168 hours.   Radiology    Dg Chest Port 1 View  Result Date: 06/13/2019 IMPRESSION: Cardiac pacer in stable position. Cardiomegaly. Diffuse bilateral pulmonary infiltrates/edema and bilateral pleural effusions. Bibasilar atelectasis. Similar findings noted on prior exam. Electronically Signed   By: [place]   On: 06/13/2019 09:12   Dg Chest Portable 1 View  Result Date: 06/12/2019 IMPRESSION: Vascular congestion and small pleural effusions. Bibasilar atelectatic changes new from the prior exam. Electronically Signed   By: Inez Catalina M.D.   On: 06/12/2019 01:18    Cardiac Studies   2D Echo 01/2019: 1. The left  ventricle has moderately reduced systolic function, with an ejection fraction of 35-40%. The cavity size was normal. Left ventricular diastolic Doppler parameters are indeterminate. Probably severe hypokinesis of mid-distal anterior,  ateroseptal and apical myocardium.  2. The right ventricle has normal systolic function. The cavity was normal. There is no increase in right ventricular wall thickness.  3. Left atrial size was moderately dilated.  4. Very poor image quality in spite of using Definity. Valves not well visualized. Consider different diagnostic testing if indicated.  5. The aortic valve was not well visualized. Aortic valve regurgitation was not assessed by color flow Doppler.  Patient Profile     68 y.o. 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   Assessment & Plan    1. Acute on chronic combined systolic and diastolic CHF: -He remains volume overloaded -Minimal documented UOP on IV Lasix 60 mg bid  -Hypotension precludes escalation of IV Lasix at this time -May need to consider addition of vasopressor support to augment diuresis, will discuss with MD, in place of midodrine  -BB/ACEi/ARB/Entresto/MRA on hold secondary to hypotension  -Escalate evidence based therapy as able -Daily standing weights -Strict I/O  2. PAF: -He remains in Afib with controlled ventricular response -Beta blocker on hold in the setting of hypotension as above -Xarelto -Following diuresis, if he remains in Afib, would look to pursue rhythm control as an outpatient  -Estimated Creatinine  Clearance: 102.5 mL/min (by C-G formula based on SCr of 0.92 mg/dL).  3. CAD involving the native coronary arteries without angina/elevated HS-Tn: -Initial HS-Tn of 34, not cycled -No chest pain -ASA -Lipitor  4. OSA: -CPAP  5. Anemia: -Low, though stable -Likely contributing to symptoms   6. Hypokalemia: -Replete to goal of 4.0  For questions or updates, please contact [place] Please consult [email or web address] for contact info under Cardiology/STEMI.    Signed, Christell Faith, PA-C [place] Pager: [phone] 06/13/2019, 3:49 PM

## 2019-06-14 ENCOUNTER — Inpatient Hospital Stay: Payer: Self-pay

## 2019-06-14 ENCOUNTER — Ambulatory Visit: Payer: Medicare Other | Admitting: Physician Assistant

## 2019-06-14 DIAGNOSIS — J9601 Acute respiratory failure with hypoxia: Secondary | ICD-10-CM

## 2019-06-14 DIAGNOSIS — J9602 Acute respiratory failure with hypercapnia: Secondary | ICD-10-CM

## 2019-06-14 DIAGNOSIS — I959 Hypotension, unspecified: Secondary | ICD-10-CM

## 2019-06-14 DIAGNOSIS — E440 Moderate protein-calorie malnutrition: Secondary | ICD-10-CM

## 2019-06-14 DIAGNOSIS — I509 Heart failure, unspecified: Secondary | ICD-10-CM | POA: Diagnosis not present

## 2019-06-14 LAB — GLUCOSE, CAPILLARY
Glucose-Capillary: 92 mg/dL (ref 70–99)
Glucose-Capillary: 127 mg/dL — ABNORMAL HIGH (ref 70–99)
Glucose-Capillary: 128 mg/dL — ABNORMAL HIGH (ref 70–99)

## 2019-06-14 LAB — CBC WITH DIFFERENTIAL/PLATELET
Abs Immature Granulocytes: 0.07 10*3/uL (ref 0.00–0.07)
Basophils Absolute: 0.1 10*3/uL (ref 0.0–0.1)
Basophils Relative: 1 %
Eosinophils Absolute: 0.2 10*3/uL (ref 0.0–0.5)
Eosinophils Relative: 2 %
HCT: 28.8 % — ABNORMAL LOW (ref 39.0–52.0)
Hemoglobin: 8.4 g/dL — ABNORMAL LOW (ref 13.0–17.0)
Immature Granulocytes: 1 %
Lymphocytes Relative: 10 %
Lymphs Abs: 1.1 10*3/uL (ref 0.7–4.0)
MCH: 24.6 pg — ABNORMAL LOW (ref 26.0–34.0)
MCHC: 29.2 g/dL — ABNORMAL LOW (ref 30.0–36.0)
MCV: 84.2 fL (ref 80.0–100.0)
Monocytes Absolute: 0.7 10*3/uL (ref 0.1–1.0)
Monocytes Relative: 6 %
Neutro Abs: 8.9 10*3/uL — ABNORMAL HIGH (ref 1.7–7.7)
Neutrophils Relative %: 80 %
Platelets: 347 10*3/uL (ref 150–400)
RBC: 3.42 MIL/uL — ABNORMAL LOW (ref 4.22–5.81)
RDW: 17.1 % — ABNORMAL HIGH (ref 11.5–15.5)
WBC: 11 10*3/uL — ABNORMAL HIGH (ref 4.0–10.5)
nRBC: 0 % (ref 0.0–0.2)

## 2019-06-14 LAB — BASIC METABOLIC PANEL
Anion gap: 10 (ref 5–15)
BUN: 23 mg/dL (ref 8–23)
CO2: 41 mmol/L — ABNORMAL HIGH (ref 22–32)
Calcium: 8.3 mg/dL — ABNORMAL LOW (ref 8.9–10.3)
Chloride: 93 mmol/L — ABNORMAL LOW (ref 98–111)
Creatinine, Ser: 0.95 mg/dL (ref 0.61–1.24)
GFR calc Af Amer: >60 mL/min (ref >60)
GFR calc non Af Amer: >60 mL/min (ref >60)
Glucose, Bld: 113 mg/dL — ABNORMAL HIGH (ref 70–99)
Potassium: 3.8 mmol/L (ref 3.5–5.1)
Sodium: 144 mmol/L (ref 135–145)

## 2019-06-14 LAB — MAGNESIUM: Magnesium: 2.2 mg/dL (ref 1.7–2.4)

## 2019-06-14 MED ORDER — ALBUMIN HUMAN 25 % IV SOLN
25.0000 g | Freq: Four times a day (QID) | INTRAVENOUS | Status: AC
  Administered 2019-06-14 – 2019-06-15 (×4): 25 g via INTRAVENOUS
  Filled 2019-06-14 (×2): qty 100
  Filled 2019-06-14: qty 500
  Filled 2019-06-14: qty 100
  Filled 2019-06-14: qty 500
  Filled 2019-06-14: qty 100

## 2019-06-14 MED ORDER — POTASSIUM CHLORIDE CRYS ER 20 MEQ PO TBCR
40.0000 meq | EXTENDED_RELEASE_TABLET | Freq: Once | ORAL | Status: AC
  Administered 2019-06-14: 40 meq via ORAL
  Filled 2019-06-14: qty 2

## 2019-06-14 NOTE — Progress Notes (Signed)
Progress Note  Patient Name: Colton Lamb Date of Encounter: 06/14/2019  Primary Cardiologist: Rockey Situ  Subjective   Has not ambulated since 03/2019 per his report. No orthopnea. No chest pain. Lower extremity swelling persists. Documented UOP of 768 mL for the past 24 hours with a net - 1.1 L for the admission. Weight 119.4-->120.7 kg. Potassium 3.3-->3.8. BUN/SCr 23/0.92-->23/0.95. WBC 11.1-->11.0. HGB 8.7-->8.4. Magnesium 2.2.   Inpatient Medications    Scheduled Meds: . aspirin EC  81 mg Oral Daily  . atorvastatin  80 mg Oral Daily  . balsalazide  750 mg Oral TID  . Chlorhexidine Gluconate Cloth  6 each Topical Q0600  . feeding supplement (NEPRO CARB STEADY)  237 mL Oral BID BM  . finasteride  5 mg Oral Daily  . furosemide  60 mg Intravenous Q12H  . insulin aspart  0-9 Units Subcutaneous TID WC  . insulin detemir  9 Units Subcutaneous Daily  . lactobacillus  1 g Oral TID WC  . midodrine  10 mg Oral TID  . multivitamin-lutein   Oral Daily  . niacin  1,000 mg Oral Daily  . pantoprazole  40 mg Oral Daily  . potassium chloride  10 mEq Oral Daily  . rivaroxaban  20 mg Oral Daily  . sodium chloride flush  3 mL Intravenous Q12H  . tamsulosin  0.4 mg Oral QPC breakfast   Continuous Infusions: . sodium chloride     PRN Meds: sodium chloride, acetaminophen, albuterol, ALPRAZolam, guaiFENesin-dextromethorphan, ondansetron (ZOFRAN) IV, sodium chloride flush, zolpidem   Vital Signs    Vitals:   06/14/19 0400 06/14/19 0423 06/14/19 0500 06/14/19 0600  BP: 107/71  98/67 (!) 87/62  Pulse: 92  85 91  Resp: (!) 21  (!) 27 (!) 28  Temp: 98 F (36.7 C)     TempSrc: Oral     SpO2: 97%  100% 99%  Weight:  120.7 kg    Height:        Intake/Output Summary (Last 24 hours) at 06/14/2019 0742 Last data filed at 06/14/2019 0600 Gross per 24 hour  Intake 981.84 ml  Output 1750 ml  Net -768.16 ml   Filed Weights   06/13/19 0400 06/14/19 0423  Weight: 119.4 kg 120.7 kg     Telemetry    Afib - Personally Reviewed  ECG    No new tracings - Personally Reviewed  Physical Exam   GEN: No acute distress.   Neck: JVD difficult to assess secondary to body habitus. Cardiac: Irregularly irregular, no murmurs, rubs, or gallops.  Respiratory: Clear to auscultation bilaterally.  GI: Soft, nontender, non-distended.   MS: 2+ bilateral woody edema to the thighs; No deformity. Neuro:  Alert and oriented x 3; Nonfocal.  Psych: Normal affect.  Labs    Chemistry Recent Labs  Lab 06/12/19 0100 06/13/19 0619 06/14/19 0355  NA 142 140 144  K 4.0 3.3* 3.8  CL 89* 90* 93*  CO2 42* 39* 41*  GLUCOSE 127* 117* 113*  BUN 24* 23 23  CREATININE 0.98 0.92 0.95  CALCIUM 8.6* 8.2* 8.3*  PROT 6.6  --   --   ALBUMIN 2.9*  --   --   AST 27  --   --   ALT 15  --   --   ALKPHOS 160*  --   --   BILITOT 0.8  --   --   GFRNONAA >60 >60 >60  GFRAA >60 >60 >60  ANIONGAP 11 11 10  Hematology Recent Labs  Lab 06/12/19 0642 06/13/19 0619 06/14/19 0355  WBC 14.9* 11.1* 11.0*  RBC 3.49* 3.49* 3.42*  HGB 8.8* 8.7* 8.4*  HCT 29.4* 29.3* 28.8*  MCV 84.2 84.0 84.2  MCH 25.2* 24.9* 24.6*  MCHC 29.9* 29.7* 29.2*  RDW 17.0* 17.1* 17.1*  PLT 337 340 347    Cardiac EnzymesNo results for input(s): TROPONINI in the last 168 hours. No results for input(s): TROPIPOC in the last 168 hours.   BNP Recent Labs  Lab 06/12/19 0100  BNP 731.0*     DDimer No results for input(s): DDIMER in the last 168 hours.   Radiology    Dg Chest Port 1 View  Result Date: 06/13/2019 IMPRESSION: Cardiac pacer in stable position. Cardiomegaly. Diffuse bilateral pulmonary infiltrates/edema and bilateral pleural effusions. Bibasilar atelectasis. Similar findings noted on prior exam. Electronically Signed   By: [place]   On: 06/13/2019 09:12    Cardiac Studies   2D Echo 01/2019: 1. The left ventricle has moderately reduced systolic function, with an ejection fraction of  35-40%. The cavity size was normal. Left ventricular diastolic Doppler parameters are indeterminate. Probably severe hypokinesis of mid-distal anterior,  ateroseptal and apical myocardium. 2. The right ventricle has normal systolic function. The cavity was normal. There is no increase in right ventricular wall thickness. 3. Left atrial size was moderately dilated. 4. Very poor image quality in spite of using Definity. Valves not well visualized. Consider different diagnostic testing if indicated. 5. The aortic valve was not well visualized. Aortic valve regurgitation was not assessed by color flow Doppler.  Patient Profile     68 y.o. 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  Assessment & Plan    1. Acute on chronic combined systolic and diastolic CHF: -Volume status is somewhat difficult to assess  -Cannot exclude some component of 3rd spacing in the setting of hypoalbuminemia and anemia -TED hose/SCDs -UOP has improved slightly over the past 24 hours with IV Lasix 60 mg bid  -Continue midodrine -Will see if we have any samples of Northera in the office (not on formulary)  -May need to consider addition of vasopressor support to augment diuresis, though weaning him from this would be difficult  -BB/ACEi/ARB/Entresto/MRA on hold secondary to hypotension  -Escalate evidence based therapy as able -Daily standing weights -Strict I/O  2. PAF: -He remains in Afib with controlled ventricular response -Beta blocker on hold in the setting of hypotension as above -Xarelto -Has  been in Afib for ~ past 12 months -Pursue rate control strategy  -Estimated Creatinine Clearance: 99.8 mL/min (by C-G formula based on SCr of 0.95 mg/dL).  3. CAD involving the native coronary arteries without angina/elevated HS-Tn: -Initial HS-Tn of 34, not cycled -No chest pain -ASA -Lipitor  4. OSA: -CPAP  5. Anemia: -Low, though relatively stable -Likely contributing to symptoms   6. Hypokalemia: -Improving -Replete to goal of 4.0  7. Hypotension: -Likely multifactorial including anemia, hypoalbuminemia, deconditioning in the setting of multiple admissions over the past year, sacral wound -20 pounds weight loss over  the past year -Likely contributing to minimal diuresis thus far -May need IV albumin      For questions or updates, please contact [place] Please consult [email or web address] for contact info under Cardiology/STEMI.    Signed, Christell Faith, PA-C [place] Pager: [phone] 06/14/2019, 7:42 AM

## 2019-06-14 NOTE — Progress Notes (Signed)
Spoke with York Cerise, RN regarding PICC, aware that PICC will not be placed until 11-14.  Per York Cerise patient has adequate IV access at this time, recommended consulting IV team for IV if more access is needed prior to PICC.

## 2019-06-14 NOTE — Progress Notes (Addendum)
PROGRESS NOTE    ONOFRE GAINS  VPX:[id number] DOB: 08-01-1951 DOA: 06/12/2019 PCP: [name], MD   Brief Narrative:  [name] a68 y.o.obese Caucasian malewith a known history of systolic CHF, coronary artery disease hypertension dyslipidemia and Crohn's disease, presented to the emergency room with acute onset of worsening dyspnea with associated dry cough and wheezing for the last 24 hours without fever or chills. He he admitted to orthopnea and paroxysmal nocturnal dyspnea as well as dyspnea on exertion with lower extremity edema. No fever or chills. No nausea or vomiting. No dysuria, oliguria or hematuria or flank pain. No recent exposure to COVID-19. He was tested negative about a week ago   Assessment & Plan:   Active Problems:   Coronary artery disease involving native coronary artery of native heart without angina pectoris   Acute respiratory failure ([place])   1. Acute on chronic systolic CHF with subsequent acute hypoxic and likely hypercarbic respiratory failure requiring BiPAP.  - weaned off bipap - c/w diuresis, limited by hypotension - add albumin 25g q6 x 4 doses - may need pressors to facilitate diuresis-ordered PICC 11/13, Planned placement  11/14 - appreciate cards consult - strict I/o's  2. Type 2 diabetes mellitus.    - c/w sliding scale insulin,  - check blood glucose AC at bedtime,  - diabetic diet  3. Dyslipidemia.   - Lipitor 80 mg daily  4. Crohn's disease. -balsalazide.  5. BPH.  - continue Proscar and Flomax.  6. Stage 4 sacrum decubiti POA;  - wound care consult appreciated, continue with Aquacel, Kerlix, ABD pad and change daily, agree with repositioning every 2 hours  DVT prophylaxis: IO:[id number]   Code Status: full    Code Status Orders  (From admission, onward)         Start     Ordered   06/12/19 0351  Full code  Continuous     06/12/19 0359        Code Status History    Date Active  Date Inactive Code Status Order ID Comments User Context   04/21/2019 1857 04/26/2019 2045 Full Code [id number]  Demetrios Loll, MD Inpatient   03/04/2019 2021 03/19/2019 2042 Full Code [id number]  [name], MD Inpatient   09/07/2016 0100 09/09/2016 2001 Full Code [id number]  Harvie Bridge, DO Inpatient   04/16/2014 1552 04/17/2014 1512 Full Code [id number]  Evans Lance, MD Inpatient   12/11/2013 2042 12/14/2013 1516 Full Code [id number]  Eileen Stanford, PA-C Inpatient   11/22/2013 1434 11/30/2013 2000 Full Code [id number]  Belva Crome, MD Inpatient   11/22/2013 0916 11/22/2013 1434 Full Code [id number]  Dorothy Spark, MD Inpatient   11/22/2013 0829 11/22/2013 0916 Full Code [id number]  Barrett, Evelene Croon, PA-C Inpatient   Advance Care Planning Activity     Family Communication: Left message with son Disposition Plan:   Patient will remain inpatient for continued IV diuresis possible initiation of pressors. Consults called: None Admission status: Inpatient   Consultants:   Cardiology  Procedures:  Dg Chest Port 1 View  Result Date: 06/13/2019 CLINICAL DATA:  CHF. EXAM: PORTABLE CHEST 1 VIEW COMPARISON:  06/12/2019. FINDINGS: Cardiac pacer noted stable position. Cardiomegaly. Bibasilar atelectasis. Diffuse bilateral pulmonary infiltrates/edema and bilateral pleural effusions. Degenerative change thoracic spine. IMPRESSION: Cardiac pacer in stable position. Cardiomegaly. Diffuse bilateral pulmonary infiltrates/edema and bilateral pleural effusions. Bibasilar atelectasis. Similar findings noted on prior exam. Electronically Signed   By: [place]   On: 06/13/2019 09:12   Dg  Chest Portable 1 View  Result Date: 06/12/2019 CLINICAL DATA:  Respiratory distress EXAM: PORTABLE CHEST 1 VIEW COMPARISON:  05/18/2019 FINDINGS: Cardiac shadow remains enlarged. Defibrillator is again noted. Mild vascular congestion is again seen with likely posteriorly layering effusions. Mild bibasilar atelectatic  changes are noted increased from the prior study. This may be related to a poor inspiratory effort. No bony abnormality is seen. IMPRESSION: Vascular congestion and small pleural effusions. Bibasilar atelectatic changes new from the prior exam. Electronically Signed   By: Inez Catalina M.D.   On: 06/12/2019 01:18   Dg Chest Portable 1 View  Result Date: 05/18/2019 CLINICAL DATA:  68 year old male with shortness of breath and altered mental status. EXAM: PORTABLE CHEST 1 VIEW COMPARISON:  Chest radiograph dated 04/30/2019 FINDINGS: There is moderate cardiomegaly with vascular congestion. Small bilateral pleural effusions and bibasilar atelectasis. Pneumonia is not excluded. Clinical correlation is recommended. Overall similar appearance of the cardiomediastinal silhouette and vascular congestion and edema to the radiograph of 04/30/2019. Right upper lobe opacity, similar to prior radiograph and likely combination of pleural effusion and associated atelectasis. There is no pneumothorax. Atherosclerotic calcification of the aortic arch. Left pectoral AICD device. No acute osseous pathology. IMPRESSION: Cardiomegaly with vascular congestion and bilateral pleural effusions overall similar to the radiograph of 04/30/2019. Electronically Signed   By: Anner Crete M.D.   On: 05/18/2019 15:58     Antimicrobials:   NONE   Subjective: Patient reports feeling fine, Has had decreased urinary output despite aggressive Lasix treatment Blood pressure is improving  Objective: Vitals:   06/14/19 1100 06/14/19 1200 06/14/19 1300 06/14/19 1400  BP: 110/79 99/71 96/70  104/69  Pulse: 84 (!) 102 (!) 102 100  Resp: (!) 27 17 (!) 24 (!) 25  Temp:      TempSrc:      SpO2: 100% 100% 100% 100%  Weight:      Height:        Intake/Output Summary (Last 24 hours) at 06/14/2019 1533 Last data filed at 06/14/2019 1328 Gross per 24 hour  Intake 384.84 ml  Output 1275 ml  Net -890.16 ml   Filed Weights    06/13/19 0400 06/14/19 0423  Weight: 119.4 kg 120.7 kg    Examination:  General exam: Appears calm and comfortable  Respiratory system: Clear to auscultation. Respiratory effort normal. Cardiovascular system: S1 & S2 heard, RRR. No JVD, murmurs, rubs, gallops or clicks. No pedal edema. Gastrointestinal system: Abdomen is nondistended, soft and nontender. No organomegaly or masses felt. Normal bowel sounds heard. Central nervous system: Alert and oriented. No focal neurological deficits. Extremities: Edema up to thigh 2+ pitting. Skin: No rashes, lesions or ulcers Psychiatry: Judgement and insight appear normal. Mood & affect appropriate.     Data Reviewed: I have personally reviewed following labs and imaging studies  CBC: Recent Labs  Lab 06/12/19 0100 06/12/19 0642 06/13/19 0619 06/14/19 0355  WBC 12.8* 14.9* 11.1* 11.0*  NEUTROABS 10.9* 12.8* 9.1* 8.9*  HGB 9.4* 8.8* 8.7* 8.4*  HCT 31.3* 29.4* 29.3* 28.8*  MCV 85.1 84.2 84.0 84.2  PLT 364 337 340 856   Basic Metabolic Panel: Recent Labs  Lab 06/12/19 0100 06/13/19 0619 06/14/19 0355  NA 142 140 144  K 4.0 3.3* 3.8  CL 89* 90* 93*  CO2 42* 39* 41*  GLUCOSE 127* 117* 113*  BUN 24* 23 23  CREATININE 0.98 0.92 0.95  CALCIUM 8.6* 8.2* 8.3*  MG  --  1.8 2.2   GFR: Estimated Creatinine Clearance:  99.8 mL/min (by C-G formula based on SCr of 0.95 mg/dL). Liver Function Tests: Recent Labs  Lab 06/12/19 0100  AST 27  ALT 15  ALKPHOS 160*  BILITOT 0.8  PROT 6.6  ALBUMIN 2.9*   No results for input(s): LIPASE, AMYLASE in the last 168 hours. No results for input(s): AMMONIA in the last 168 hours. Coagulation Profile: No results for input(s): INR, PROTIME in the last 168 hours. Cardiac Enzymes: No results for input(s): CKTOTAL, CKMB, CKMBINDEX, TROPONINI in the last 168 hours. BNP (last 3 results) No results for input(s): PROBNP in the last 8760 hours. HbA1C: Recent Labs    06/12/19 0642  HGBA1C 5.1    CBG: Recent Labs  Lab 06/13/19 1140 06/13/19 1629 06/13/19 2206 06/14/19 0725 06/14/19 1153  GLUCAP 178* 160* 165* 92 127*   Lipid Profile: No results for input(s): CHOL, HDL, LDLCALC, TRIG, CHOLHDL, LDLDIRECT in the last 72 hours. Thyroid Function Tests: No results for input(s): TSH, T4TOTAL, FREET4, T3FREE, THYROIDAB in the last 72 hours. Anemia Panel: No results for input(s): VITAMINB12, FOLATE, FERRITIN, TIBC, IRON, RETICCTPCT in the last 72 hours. Sepsis Labs: No results for input(s): PROCALCITON, LATICACIDVEN in the last 168 hours.  Recent Results (from the past 240 hour(s))  SARS CORONAVIRUS 2 (TAT 6-24 HRS) Nasopharyngeal Nasopharyngeal Swab     Status: None   Collection Time: 06/12/19  1:00 AM   Specimen: Nasopharyngeal Swab  Result Value Ref Range Status   SARS Coronavirus 2 NEGATIVE NEGATIVE Final    Comment: (NOTE) SARS-CoV-2 target nucleic acids are NOT DETECTED. The SARS-CoV-2 RNA is generally detectable in upper and lower respiratory specimens during the acute phase of infection. Negative results do not preclude SARS-CoV-2 infection, do not rule out co-infections with other pathogens, and should not be used as the sole basis for treatment or other patient management decisions. Negative results must be combined with clinical observations, patient history, and epidemiological information. The expected result is Negative. Fact Sheet for Patients: SugarRoll.be Fact Sheet for Healthcare Providers: https://www.woods-mathews.com/ This test is not yet approved or cleared by the [place] FDA and  has been authorized for detection and/or diagnosis of SARS-CoV-2 by FDA under an Emergency Use Authorization (EUA). This EUA will remain  in effect (meaning this test can be used) for the duration of the COVID-19 declaration under Section 56 4(b)(1) of the Act, 21 U.S.C. section 360bbb-3(b)(1), unless the authorization is  terminated or revoked sooner. Performed at [facility], [place] [address] [place], [place] [address]   MRSA PCR Screening     Status: None   Collection Time: 06/12/19 11:00 PM   Specimen: Nasopharyngeal  Result Value Ref Range Status   MRSA by PCR NEGATIVE NEGATIVE Final    Comment:        The GeneXpert MRSA Assay (FDA approved for NASAL specimens only), is one component of a comprehensive MRSA colonization surveillance program. It is not intended to diagnose MRSA infection nor to guide or monitor treatment for MRSA infections. Performed at [facility], [address], [place], [place] [address]          Radiology Studies: Dg Chest [facility] 1 View  Result Date: 06/13/2019 CLINICAL DATA:  CHF. EXAM: PORTABLE CHEST 1 VIEW COMPARISON:  06/12/2019. FINDINGS: Cardiac pacer noted stable position. Cardiomegaly. Bibasilar atelectasis. Diffuse bilateral pulmonary infiltrates/edema and bilateral pleural effusions. Degenerative change thoracic spine. IMPRESSION: Cardiac pacer in stable position. Cardiomegaly. Diffuse bilateral pulmonary infiltrates/edema and bilateral pleural effusions. Bibasilar atelectasis. Similar findings noted  on prior exam. Electronically Signed   By: [place]   On: 06/13/2019 09:12        Scheduled Meds: . aspirin EC  81 mg Oral Daily  . atorvastatin  80 mg Oral Daily  . balsalazide  750 mg Oral TID  . Chlorhexidine Gluconate Cloth  6 each Topical Q0600  . feeding supplement (NEPRO CARB STEADY)  237 mL Oral BID BM  . finasteride  5 mg Oral Daily  . furosemide  60 mg Intravenous Q12H  . insulin aspart  0-9 Units Subcutaneous TID WC  . insulin detemir  9 Units Subcutaneous Daily  . lactobacillus  1 g Oral TID WC  . midodrine  10 mg Oral TID  . multivitamin-lutein   Oral Daily  . niacin  1,000 mg Oral Daily  . pantoprazole  40 mg Oral Daily  . potassium chloride  10 mEq Oral Daily  . potassium chloride  40 mEq Oral Once  .  rivaroxaban  20 mg Oral Daily  . sodium chloride flush  3 mL Intravenous Q12H  . tamsulosin  0.4 mg Oral QPC breakfast   Continuous Infusions: . sodium chloride    . albumin human       LOS: 2 days    Time spent: [place], MD [facility]  If 7PM-7AM, please contact night-coverage  06/14/2019, 3:33 PM

## 2019-06-14 NOTE — NC FL2 (Signed)
[place] LEVEL OF CARE SCREENING TOOL     IDENTIFICATION  Patient Name: Colton Lamb Birthdate: [birth year] Sex: male Admission Date (Current Location): 06/12/2019  [facility] and [place] Number:  Engineering geologist and Address:         Provider Number: [id number]  Attending Physician Name and Address:  Marcell Anger*  Relative Name and Phone Number:       Current Level of Care: Hospital Recommended Level of Care: [place] Prior Approval Number:    Date Approved/Denied:   PASRR Number: [id number]  Discharge Plan: SNF    Current Diagnoses: Patient Active Problem List   Diagnosis Date Noted  . Acute respiratory failure ([place]) 06/12/2019  . Acute on chronic congestive heart failure ([place])   . Acute blood loss anemia   . Pressure injury of skin 03/17/2019  . Acute renal failure (ARF) ([place])   . Empyema lung ([place])   . Shortness of breath   . Pleural effusion on right   . Sepsis ([place]) 03/04/2019  . Restless legs syndrome (RLS) 06/16/2017  . Hypertension, essential 06/16/2017  . Coronary artery disease involving native coronary artery of native heart without angina pectoris 04/27/2017  . Hydrocephalus ([place]) 09/08/2016  . Dizziness and giddiness   . Elevated troponin 09/06/2016  . Type II diabetes mellitus ([place])   . Ischemic cardiomyopathy   . Hyponatremia 07/01/2015  . Diabetes mellitus type 2, uncontrolled, with complications ([place]) 47/82/9562  . 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 OSA (obstructive sleep apnea) 12/20/2013  . Morbid obesity ([place]) 12/20/2013  . AKI (acute kidney injury) - Creatinine improved at d/c 12/14/2013  . Elevated TSH - will need f/u TFTs with PCP in 3-4 weeks 12/14/2013  . Junctional bradycardia - resolved 12/14/2013  . Syncope - due to bradycardia in setting of Digoxin Toxicity 12/11/2013  . Chronic systolic heart failure ([place]) 12/06/2013  . At risk for sudden  cardiac death - on LifeVest 12-14-13  . Acute combined systolic and diastolic CHF, NYHA class 3 -- 2/2 MI 12/14/2013  . Cardiomyopathy, ischemic Dec 14, 2013  . Atrial flutter ([place]) 11/22/2013  . NSTEMI (non-ST elevated myocardial infarction) ([place]) 11/22/2013  . Long term current use of anticoagulant 10/19/2010  . Hyperlipidemia 05/06/2010  . Edema 05/06/2010  . CAD S/P percutaneous coronary angioplasty - multiple PCIs 11/11/2008  . Atrial fibrillation ([place]) 11/11/2008  . GERD 11/11/2008  . [place] INTESTINE 11/11/2008  . COLONIC POLYPS, HX OF 11/11/2008  . SHELLFISH ALLERGY 11/11/2008    Orientation RESPIRATION BLADDER Height & Weight     Self, Place, Time  Normal Indwelling catheter Weight: 266 lb 1.5 oz (120.7 kg) Height:  6' (182.9 cm)  BEHAVIORAL SYMPTOMS/MOOD NEUROLOGICAL BOWEL NUTRITION STATUS  (none) (none) Incontinent Diet(Carb Modified)  AMBULATORY STATUS COMMUNICATION OF NEEDS Skin   Extensive Assist Verbally Other (Comment)(Stage IV on Sacrum)                       Personal Care Assistance Level of Assistance  Bathing, Feeding, Dressing Bathing Assistance: Limited assistance Feeding assistance: Independent Dressing Assistance: Limited assistance     Functional Limitations Info  Hearing, Sight, Speech Sight Info: Adequate Hearing Info: Adequate Speech Info: Adequate    SPECIAL CARE FACTORS FREQUENCY  PT (By licensed PT), OT (By licensed OT)     PT Frequency: 5 OT Frequency: 5            Contractures Contractures Info: Not  present    Additional Factors Info  Code Status, Allergies Code Status Info: Full Code Allergies Info: Iodine, Shrimp, Tetracycline           Current Medications (06/14/2019):  This is the current hospital active medication list Current Facility-Administered Medications  Medication Dose Route Frequency Provider Last Rate Last Dose  . 0.9 %  sodium chloride infusion  250 mL Intravenous PRN Mansy,  Jan A, MD      . acetaminophen (TYLENOL) tablet 650 mg  650 mg Oral Q4H PRN Mansy, Jan A, MD      . albuterol (VENTOLIN HFA) 108 (90 Base) MCG/ACT inhaler 1 puff  1 puff Inhalation Q6H PRN Mansy, Jan A, MD      . ALPRAZolam Duanne Moron) tablet 0.25 mg  0.25 mg Oral BID PRN Mansy, Jan A, MD      . aspirin EC tablet 81 mg  81 mg Oral Daily Mansy, Jan A, MD   81 mg at 06/14/19 1116  . atorvastatin (LIPITOR) tablet 80 mg  80 mg Oral Daily Mansy, Jan A, MD   80 mg at 06/13/19 1741  . balsalazide (COLAZAL) capsule 750 mg  750 mg Oral TID Mansy, Jan A, MD      . Chlorhexidine Gluconate Cloth 2 % PADS 6 each  6 each Topical [id number] Flora Lipps, MD   6 each at 06/14/19 1120  . feeding supplement (NEPRO CARB STEADY) liquid 237 mL  237 mL Oral BID BM Mansy, Jan A, MD   237 mL at 06/14/19 1328  . finasteride (PROSCAR) tablet 5 mg  5 mg Oral Daily Mansy, Jan A, MD   5 mg at 06/14/19 1114  . furosemide (LASIX) injection 60 mg  60 mg Intravenous Q12H Kate Sable, MD   60 mg at 06/14/19 0820  . guaiFENesin-dextromethorphan (ROBITUSSIN DM) 100-10 MG/5ML syrup 10 mL  10 mL Oral Q4H PRN Mansy, Jan A, MD      . insulin aspart (novoLOG) injection 0-9 Units  0-9 Units Subcutaneous TID WC Mansy, Arvella Merles, MD   1 Units at 06/14/19 1331  . insulin detemir (LEVEMIR) injection 9 Units  9 Units Subcutaneous Daily Mansy, Arvella Merles, MD   9 Units at 06/14/19 1119  . lactobacillus (FLORANEX/LACTINEX) granules 1 g  1 g Oral TID WC Mansy, Jan A, MD   1 g at 06/14/19 8850  . midodrine (PROAMATINE) tablet 10 mg  10 mg Oral TID Mansy, Jan A, MD   10 mg at 06/14/19 1116  . multivitamin-lutein (OCUVITE-LUTEIN) capsule   Oral Daily Mansy, Jan A, MD   1 capsule at 06/14/19 1117  . niacin (NIASPAN) CR tablet 1,000 mg  1,000 mg Oral Daily Mansy, Jan A, MD   1,000 mg at 06/14/19 1118  . ondansetron (ZOFRAN) injection 4 mg  4 mg Intravenous Q6H PRN Mansy, Jan A, MD      . pantoprazole (PROTONIX) EC tablet 40 mg  40 mg Oral Daily Mansy, Jan A, MD    40 mg at 06/14/19 1114  . potassium chloride (KLOR-CON) CR tablet 10 mEq  10 mEq Oral Daily Mansy, Jan A, MD   10 mEq at 06/14/19 1117  . potassium chloride SA (KLOR-CON) CR tablet 40 mEq  40 mEq Oral Once Dallie Piles, [facility]      . rivaroxaban (XARELTO) tablet 20 mg  20 mg Oral Daily Mansy, Jan A, MD   20 mg at 06/14/19 1117  . sodium chloride flush (NS) 0.9 % injection 3  mL  3 mL Intravenous Q12H Mansy, Jan A, MD   3 mL at 06/14/19 1121  . sodium chloride flush (NS) 0.9 % injection 3 mL  3 mL Intravenous PRN Mansy, Jan A, MD      . tamsulosin [facility]) capsule 0.4 mg  0.4 mg Oral QPC breakfast Mansy, Jan A, MD   0.4 mg at 06/14/19 1115  . zolpidem (AMBIEN) tablet 5 mg  5 mg Oral QHS PRN Mansy, Arvella Merles, MD         Discharge Medications: Please see discharge summary for a list of discharge medications.  Relevant Imaging Results:  Relevant Lab Results:   Additional Information SSN: [id number]  Annamaria Boots, [place]

## 2019-06-14 NOTE — TOC Initial Note (Signed)
Transition of Care [facility]) - Initial/Assessment Note    Patient Details  Name: Colton Lamb MRN: [id number] Date of Birth: [birth year]  Transition of Care [facility]) CM/SW Contact:    Annamaria Boots, [place] Phone Number: 06/14/2019, 2:52 PM  Clinical Narrative:   Patient recently admitted from [facility]. CSW spoke with patient to discuss discharge planning. Patient reports that he has been at [facility] for rehab and would like to return if he needs SNF again. PT is currently recommending SNF. Patient is agreeable to return. CSW spoke with [facility] at [facility] and she states that [facility] can return to [facility] when medically stable. CSW will continue to follow for discharge planning.                 Expected Discharge Plan: Skilled Nursing Facility Barriers to Discharge: Continued Medical Work up   Patient Goals and CMS Choice Patient states their goals for this hospitalization and ongoing recovery are:: return to SNF CMS Medicare.gov Compare Post Acute Care list provided to:: Patient Choice offered to / list presented to : Patient  Expected Discharge Plan and Services Expected Discharge Plan: [place] Choice: [place] Living arrangements for the past 2 months: [place]                                      Prior Living Arrangements/Services Living arrangements for the past 2 months: [place] Lives with:: Facility Resident Patient language and need for interpreter reviewed:: No        Need for Family Participation in Patient Care: Yes (Comment) Care giver support system in place?: Yes (comment)   Criminal Activity/Legal Involvement Pertinent to Current Situation/Hospitalization: No - Comment as needed  Activities of Daily Living   ADL Screening (condition at time of admission) Patient's cognitive ability adequate to safely complete daily activities?: Yes Is the patient deaf or have  difficulty hearing?: No Does the patient have difficulty seeing, even when wearing glasses/contacts?: No Does the patient have difficulty concentrating, remembering, or making decisions?: No Patient able to express need for assistance with ADLs?: Yes Does the patient have difficulty dressing or bathing?: Yes Independently performs ADLs?: No Does the patient have difficulty walking or climbing stairs?: Yes Weakness of Legs: Both Weakness of Arms/Hands: Both  Permission Sought/Granted Permission sought to share information with : Case Manager, Customer service manager, Family Supports Permission granted to share information with : Yes, Verbal Permission Granted              Emotional Assessment Appearance:: Appears stated age     Orientation: : Oriented to Self, Oriented to Place, Oriented to  Time Alcohol / Substance Use: Not Applicable Psych Involvement: No (comment)  Admission diagnosis:  Acute respiratory failure with hypoxia and hypercapnia (HCC) [J96.01, J96.02] Acute on chronic congestive heart failure, unspecified heart failure type ([place]) [I50.9] Patient Active Problem List   Diagnosis Date Noted  . Acute respiratory failure ([place]) 06/12/2019  . Acute on chronic congestive heart failure ([place])   . Acute blood loss anemia   . Pressure injury of skin 03/17/2019  . Acute renal failure (ARF) ([place])   . Empyema lung ([place])   . Shortness of breath   . Pleural effusion on right   . Sepsis ([place]) 03/04/2019  . Restless legs syndrome (RLS) 06/16/2017  . Hypertension, essential 06/16/2017  .  Coronary artery disease involving native coronary artery of native heart without angina pectoris 04/27/2017  . Hydrocephalus ([place]) 09/08/2016  . Dizziness and giddiness   . Elevated troponin 09/06/2016  . Type II diabetes mellitus ([place])   . Ischemic cardiomyopathy   . Hyponatremia 07/01/2015  . Diabetes mellitus type 2, uncontrolled, with complications ([place]) 28/31/5176  . ICD  (implantable cardioverter-defibrillator) in place 05/27/2014  . OSA (obstructive sleep apnea) 12/20/2013  . Morbid obesity ([place]) 12/20/2013  . AKI (acute kidney injury) - Creatinine improved at d/c 12/14/2013  . Elevated TSH - will need f/u TFTs with PCP in 3-4 weeks 12/14/2013  . Junctional bradycardia - resolved 12/14/2013  . Syncope - due to bradycardia in setting of Digoxin Toxicity 12/11/2013  . Chronic systolic heart failure ([place]) 12/06/2013  . At risk for sudden cardiac death - on LifeVest 2013-12-20  . Acute combined systolic and diastolic CHF, NYHA class 3 -- 2/2 MI 12-20-13  . Cardiomyopathy, ischemic 20-Dec-2013  . Atrial flutter ([place]) 11/22/2013  . NSTEMI (non-ST elevated myocardial infarction) ([place]) 11/22/2013  . Long term current use of anticoagulant 10/19/2010  . Hyperlipidemia 05/06/2010  . Edema 05/06/2010  . CAD S/P percutaneous coronary angioplasty - multiple PCIs 11/11/2008  . Atrial fibrillation ([place]) 11/11/2008  . GERD 11/11/2008  . [place] INTESTINE 11/11/2008  . COLONIC POLYPS, HX OF 11/11/2008  . [place] ALLERGY 11/11/2008   PCP:  [name], MD Pharmacy:   [id number] [place] [address]Phone: [phone]: [phone] [facility] [id number]: [phone]: [phone]    Social Determinants of Health (SDOH) Interventions    Readmission Risk Interventions Readmission Risk Prevention Plan 04/24/2019 03/11/2019  Transportation Screening Complete Complete  Social Work Consult for [place]/Counseling - Complete  Palliative Care Screening - Not Applicable  Medication Review (Press photographer Complete Complete  PCP or Specialist appointment within 3-5 days of discharge Complete -  [place](No Data) -   SW Recovery Care/Counseling Consult Complete -  Palliative Care Screening Complete -  Skilled Nursing Facility Complete -  Some recent data might be hidden

## 2019-06-14 NOTE — Consult Note (Addendum)
[place] for Electrolyte Monitoring and Replacement   Colton Lamb is a 79 YOM with a hx of CHF, CAD, HTN, dyslipidemia, and Crohn's Disease who presented to the ED with acute onset of worsening dyspnea associated with dry cough and wheezing. He was placed on BiPAP and admitted to stepdown for further evaluation.  Pharmacy has been consulted for electrolyte monitoring and replacement.  Patient is receiving 18m IV Lasix q12h. He is on a carb modified diet and also getting NEPRO CARB STEADY 2354mBID between meals. He is also receiving potassium chloride 104mPO daily inpatient (PTA med).  Recent Labs: Potassium (mmol/L)  Date Value  06/14/2019 3.8  11/22/2013 3.7   Magnesium (mg/dL)  Date Value  06/14/2019 2.2  11/22/2013 1.8   Calcium (mg/dL)  Date Value  06/14/2019 8.3 (L)   Calcium, Total (mg/dL)  Date Value  11/22/2013 9.0   Albumin (g/dL)  Date Value  06/12/2019 2.9 (L)  04/18/2018 4.3   Phosphorus (mg/dL)  Date Value  04/21/2019 4.8 (H)   Sodium (mmol/L)  Date Value  06/14/2019 144  05/09/2016 141  11/22/2013 133 (L)   Corrected Calcium: 9.2 mg/dL  Assessment/Plan: - Will receive potassium chloride 40 mEq x 1. - No other electrolytes warrant supplementation at this time.  - Will check BMP with AM labs and adjust as necessary.  Goals of Therapy:  - K ~ 4 - Mg ~ 2 - Other electrolytes WNL   Thank you for allowing pharmacy to be a part of this patient's care.   ColRaiford SimmondsharmD Candidate 06/14/2019 12:05 PM

## 2019-06-15 ENCOUNTER — Inpatient Hospital Stay: Payer: Medicare Other

## 2019-06-15 DIAGNOSIS — J9600 Acute respiratory failure, unspecified whether with hypoxia or hypercapnia: Secondary | ICD-10-CM

## 2019-06-15 DIAGNOSIS — J9621 Acute and chronic respiratory failure with hypoxia: Secondary | ICD-10-CM

## 2019-06-15 LAB — GLUCOSE, CAPILLARY
Glucose-Capillary: 115 mg/dL — ABNORMAL HIGH (ref 70–99)
Glucose-Capillary: 121 mg/dL — ABNORMAL HIGH (ref 70–99)
Glucose-Capillary: 129 mg/dL — ABNORMAL HIGH (ref 70–99)
Glucose-Capillary: 149 mg/dL — ABNORMAL HIGH (ref 70–99)

## 2019-06-15 LAB — CBC WITH DIFFERENTIAL/PLATELET
Abs Immature Granulocytes: 0.05 10*3/uL (ref 0.00–0.07)
Basophils Absolute: 0.1 10*3/uL (ref 0.0–0.1)
Basophils Relative: 1 %
Eosinophils Absolute: 0.3 10*3/uL (ref 0.0–0.5)
Eosinophils Relative: 3 %
HCT: 27.3 % — ABNORMAL LOW (ref 39.0–52.0)
Hemoglobin: 7.9 g/dL — ABNORMAL LOW (ref 13.0–17.0)
Immature Granulocytes: 0 %
Lymphocytes Relative: 10 %
Lymphs Abs: 1.1 10*3/uL (ref 0.7–4.0)
MCH: 24.9 pg — ABNORMAL LOW (ref 26.0–34.0)
MCHC: 28.9 g/dL — ABNORMAL LOW (ref 30.0–36.0)
MCV: 86.1 fL (ref 80.0–100.0)
Monocytes Absolute: 0.8 10*3/uL (ref 0.1–1.0)
Monocytes Relative: 7 %
Neutro Abs: 9.1 10*3/uL — ABNORMAL HIGH (ref 1.7–7.7)
Neutrophils Relative %: 79 %
Platelets: 298 10*3/uL (ref 150–400)
RBC: 3.17 MIL/uL — ABNORMAL LOW (ref 4.22–5.81)
RDW: 17.1 % — ABNORMAL HIGH (ref 11.5–15.5)
WBC: 11.4 10*3/uL — ABNORMAL HIGH (ref 4.0–10.5)
nRBC: 0 % (ref 0.0–0.2)

## 2019-06-15 LAB — BASIC METABOLIC PANEL
Anion gap: 11 (ref 5–15)
BUN: 23 mg/dL (ref 8–23)
CO2: 39 mmol/L — ABNORMAL HIGH (ref 22–32)
Calcium: 8.5 mg/dL — ABNORMAL LOW (ref 8.9–10.3)
Chloride: 92 mmol/L — ABNORMAL LOW (ref 98–111)
Creatinine, Ser: 1 mg/dL (ref 0.61–1.24)
GFR calc Af Amer: >60 mL/min (ref >60)
GFR calc non Af Amer: >60 mL/min (ref >60)
Glucose, Bld: 125 mg/dL — ABNORMAL HIGH (ref 70–99)
Potassium: 4.2 mmol/L (ref 3.5–5.1)
Sodium: 142 mmol/L (ref 135–145)

## 2019-06-15 MED ORDER — SODIUM CHLORIDE 0.9% FLUSH
10.0000 mL | Freq: Two times a day (BID) | INTRAVENOUS | Status: DC
  Administered 2019-06-15 – 2019-06-18 (×8): 10 mL
  Administered 2019-06-18: 20 mL
  Administered 2019-06-19 – 2019-06-22 (×9): 10 mL
  Administered 2019-06-23: 30 mL
  Administered 2019-06-23 – 2019-07-06 (×26): 10 mL
  Administered 2019-07-07: 20 mL
  Administered 2019-07-07 – 2019-07-09 (×4): 10 mL

## 2019-06-15 MED ORDER — SODIUM CHLORIDE 0.9% FLUSH
10.0000 mL | INTRAVENOUS | Status: DC | PRN
  Administered 2019-06-28 (×2): 10 mL
  Administered 2019-06-29: 20 mL
  Administered 2019-06-29 – 2019-06-30 (×4): 10 mL
  Filled 2019-06-15 (×7): qty 40

## 2019-06-15 MED ORDER — METOLAZONE 2.5 MG PO TABS
2.5000 mg | ORAL_TABLET | Freq: Once | ORAL | Status: AC
  Administered 2019-06-15: 2.5 mg via ORAL
  Filled 2019-06-15: qty 1

## 2019-06-15 NOTE — Consult Note (Signed)
[place] for Electrolyte Monitoring and Replacement   Colton Lamb is a 40 YOM with a hx of CHF, CAD, HTN, dyslipidemia, and Crohn's Disease who presented to the ED with acute onset of worsening dyspnea associated with dry cough and wheezing. He was placed on BiPAP and admitted to stepdown for further evaluation.  Pharmacy has been consulted for electrolyte monitoring and replacement.  Patient is receiving 4m IV Lasix q12h. He is on a carb modified diet and also getting NEPRO CARB STEADY 2350mBID between meals. He is also receiving potassium chloride 1082mPO daily inpatient (PTA med).  Recent Labs: Potassium (mmol/L)  Date Value  06/15/2019 4.2  11/22/2013 3.7   Magnesium (mg/dL)  Date Value  06/14/2019 2.2  11/22/2013 1.8   Calcium (mg/dL)  Date Value  06/15/2019 8.5 (L)   Calcium, Total (mg/dL)  Date Value  11/22/2013 9.0   Albumin (g/dL)  Date Value  06/12/2019 2.9 (L)  04/18/2018 4.3   Phosphorus (mg/dL)  Date Value  04/21/2019 4.8 (H)   Sodium (mmol/L)  Date Value  06/15/2019 142  05/09/2016 141  11/22/2013 133 (L)   Corrected Calcium: 9.4 mg/dL  Assessment/Plan: - K 4.2 - no supplementation needed at this time.  - Will check BMP with AM labs and adjust as necessary.  Goals of Therapy:  - K ~ 4 - Mg ~ 2 - Other electrolytes WNL   Thank you for allowing pharmacy to be a part of this patient's care.   WanRocky MorelharmD  06/15/2019 7:47 AM

## 2019-06-15 NOTE — Progress Notes (Signed)
PROGRESS NOTE    Colton Lamb  PPJ:[id number] DOB: March 31, 1951 DOA: 06/12/2019 PCP: [name], MD   Brief Narrative:  [name] a68 y.o.obese Caucasian malewith a known history of systolic CHF, coronary artery disease hypertension dyslipidemia and Crohn's disease, presented to the emergency room with acute onset of worsening dyspnea with associated dry cough and wheezing for the last 24 hours without fever or chills. He he admitted to orthopnea and paroxysmal nocturnal dyspnea as well as dyspnea on exertion with lower extremity edema. No fever or chills. No nausea or vomiting. No dysuria, oliguria or hematuria or flank pain. No recent exposure to COVID-19. He was tested negative about a week ago   Assessment & Plan:   Active Problems:   Coronary artery disease involving native coronary artery of native heart without angina pectoris   Acute respiratory failure ([place])   1. Acute on chronic systolic CHF with subsequent acute hypoxic and likely hypercarbic respiratory failure requiring BiPAP. - weaned off bipap - c/w diuresis, limited by hypotension -  Received albumin 25g q6 x 4 doses 11/12 - lasix 60 iv bid - cards added metolazone with regimen today -pressors if inadequate response -PICC placed - appreciate cards consult - strict I/o's  2. Type 2 diabetes mellitus. - c/w sliding scale insulin,  - check blood glucose AC at bedtime,  - diabetic diet  3. Dyslipidemia. -Lipitor 80 mg daily  4. Crohn's disease. -balsalazide.  5. BPH.  - continue Proscar and Flomax.  6. Stage 4 sacrum decubiti POA; - wound care consult appreciated, continue with Aquacel, Kerlix, ABD pad and change daily, agree with repositioning every 2 hours  DVT prophylaxis: PY:[id number]   Code Status: full    Code Status Orders  (From admission, onward)         Start     Ordered   06/12/19 0351  Full code  Continuous     06/12/19 0359        Code  Status History    Date Active Date Inactive Code Status Order ID Comments User Context   04/21/2019 1857 04/26/2019 2045 Full Code [id number]  Demetrios Loll, MD Inpatient   03/04/2019 2021 03/19/2019 2042 Full Code [id number]  [name], MD Inpatient   09/07/2016 0100 09/09/2016 2001 Full Code [id number]  Harvie Bridge, DO Inpatient   04/16/2014 1552 04/17/2014 1512 Full Code [id number]  Evans Lance, MD Inpatient   12/11/2013 2042 12/14/2013 1516 Full Code [id number]  Eileen Stanford, PA-C Inpatient   11/22/2013 1434 11/30/2013 2000 Full Code [id number]  Belva Crome, MD Inpatient   11/22/2013 0916 11/22/2013 1434 Full Code [id number]  Dorothy Spark, MD Inpatient   11/22/2013 0829 11/22/2013 0916 Full Code [id number]  Barrett, Evelene Croon, PA-C Inpatient   Advance Care Planning Activity     Family Communication: None today Disposition Plan:   Patient remained inpatient for continued IV diuresis, evaluation for initiation of IV vasopressors, continued expert subspecialty consultation.  Patient clinically not ready for discharge Consults called: None Admission status: Observation   Consultants:   Cardiology  Procedures:  Dg Chest Port 1 View  Result Date: 06/15/2019 CLINICAL DATA:  New PICC line placement EXAM: PORTABLE CHEST 1 VIEW COMPARISON:  06/13/2019 FINDINGS: Right arm PICC line tip is at the cavoatrial junction. There is a left chest wall pacer device with leads in the right atrial appendage and right ventricle. Stable cardiac bilateral pleural effusions and interstitial edema appears similar to previous exam. IMPRESSION: 1. The right arm PICC  line tip is at the cavoatrial junction. 2. Stable cardiac enlargement, interstitial edema and pleural effusions. Electronically Signed   By: Kerby Moors M.D.   On: 06/15/2019 10:14   Dg Chest Port 1 View  Result Date: 06/13/2019 CLINICAL DATA:  CHF. EXAM: PORTABLE CHEST 1 VIEW COMPARISON:  06/12/2019. FINDINGS: Cardiac pacer noted stable position.  Cardiomegaly. Bibasilar atelectasis. Diffuse bilateral pulmonary infiltrates/edema and bilateral pleural effusions. Degenerative change thoracic spine. IMPRESSION: Cardiac pacer in stable position. Cardiomegaly. Diffuse bilateral pulmonary infiltrates/edema and bilateral pleural effusions. Bibasilar atelectasis. Similar findings noted on prior exam. Electronically Signed   By: [place]   On: 06/13/2019 09:12   Dg Chest Portable 1 View  Result Date: 06/12/2019 CLINICAL DATA:  Respiratory distress EXAM: PORTABLE CHEST 1 VIEW COMPARISON:  05/18/2019 FINDINGS: Cardiac shadow remains enlarged. Defibrillator is again noted. Mild vascular congestion is again seen with likely posteriorly layering effusions. Mild bibasilar atelectatic changes are noted increased from the prior study. This may be related to a poor inspiratory effort. No bony abnormality is seen. IMPRESSION: Vascular congestion and small pleural effusions. Bibasilar atelectatic changes new from the prior exam. Electronically Signed   By: Inez Catalina M.D.   On: 06/12/2019 01:18   Dg Chest Portable 1 View  Result Date: 05/18/2019 CLINICAL DATA:  68 year old male with shortness of breath and altered mental status. EXAM: PORTABLE CHEST 1 VIEW COMPARISON:  Chest radiograph dated 04/30/2019 FINDINGS: There is moderate cardiomegaly with vascular congestion. Small bilateral pleural effusions and bibasilar atelectasis. Pneumonia is not excluded. Clinical correlation is recommended. Overall similar appearance of the cardiomediastinal silhouette and vascular congestion and edema to the radiograph of 04/30/2019. Right upper lobe opacity, similar to prior radiograph and likely combination of pleural effusion and associated atelectasis. There is no pneumothorax. Atherosclerotic calcification of the aortic arch. Left pectoral AICD device. No acute osseous pathology. IMPRESSION: Cardiomegaly with vascular congestion and bilateral pleural effusions overall  similar to the radiograph of 04/30/2019. Electronically Signed   By: Anner Crete M.D.   On: 05/18/2019 15:58   [place] Ekg Site Rite  Result Date: 06/14/2019 If Site Rite image not attached, placement could not be confirmed due to current cardiac rhythm.    Antimicrobials:   None   Subjective: Remains in the ICU with significant edema PICC line placed this morning  Objective: Vitals:   06/15/19 1100 06/15/19 1200 06/15/19 1300 06/15/19 1400  BP: 99/68 (!) 78/69 (!) 89/65 (!) 87/65  Pulse: 85 79 91 94  Resp: (!) 32 (!) 30 (!) 24 (!) 22  Temp:      TempSrc:      SpO2: 100% 99% 100% 100%  Weight:      Height:        Intake/Output Summary (Last 24 hours) at 06/15/2019 1640 Last data filed at 06/15/2019 0500 Gross per 24 hour  Intake 427.22 ml  Output 710 ml  Net -282.78 ml   Filed Weights   06/13/19 0400 06/14/19 0423 06/15/19 0424  Weight: 119.4 kg 120.7 kg 119.8 kg    Examination:  General exam: Appears calm and comfortable  Respiratory system: Difficult exam secondary to body habitus but rales noted, rhonchi noted cardiovascular system: S1 & S2 heard, RRR. No JVD, murmurs, rubs, gallops or clicks. No pedal edema. Gastrointestinal system: Abdomen is nondistended, soft and nontender. No organomegaly or masses felt. Normal bowel sounds heard. Central nervous system: Alert and oriented. No focal neurological deficits. Extremities: 2-3+ pitting edema bilaterally up to upper thigh, warm and well-perfused.  Skin: No rashes, lesions or ulcers Psychiatry: Judgement and insight appear normal. Mood & affect appropriate.     Data Reviewed: I have personally reviewed following labs and imaging studies  CBC: Recent Labs  Lab 06/12/19 0100 06/12/19 0642 06/13/19 0619 06/14/19 0355 06/15/19 0548  WBC 12.8* 14.9* 11.1* 11.0* 11.4*  NEUTROABS 10.9* 12.8* 9.1* 8.9* 9.1*  HGB 9.4* 8.8* 8.7* 8.4* 7.9*  HCT 31.3* 29.4* 29.3* 28.8* 27.3*  MCV 85.1 84.2 84.0 84.2 86.1  PLT  364 337 340 347 160   Basic Metabolic Panel: Recent Labs  Lab 06/12/19 0100 06/13/19 0619 06/14/19 0355 06/15/19 0548  NA 142 140 144 142  K 4.0 3.3* 3.8 4.2  CL 89* 90* 93* 92*  CO2 42* 39* 41* 39*  GLUCOSE 127* 117* 113* 125*  BUN 24* 23 23 23   CREATININE 0.98 0.92 0.95 1.00  CALCIUM 8.6* 8.2* 8.3* 8.5*  MG  --  1.8 2.2  --    GFR: Estimated Creatinine Clearance: 94.5 mL/min (by C-G formula based on SCr of 1 mg/dL). Liver Function Tests: Recent Labs  Lab 06/12/19 0100  AST 27  ALT 15  ALKPHOS 160*  BILITOT 0.8  PROT 6.6  ALBUMIN 2.9*   No results for input(s): LIPASE, AMYLASE in the last 168 hours. No results for input(s): AMMONIA in the last 168 hours. Coagulation Profile: No results for input(s): INR, PROTIME in the last 168 hours. Cardiac Enzymes: No results for input(s): CKTOTAL, CKMB, CKMBINDEX, TROPONINI in the last 168 hours. BNP (last 3 results) No results for input(s): PROBNP in the last 8760 hours. HbA1C: No results for input(s): HGBA1C in the last 72 hours. CBG: Recent Labs  Lab 06/14/19 1153 06/14/19 1600 06/15/19 0732 06/15/19 1144 06/15/19 1621  GLUCAP 127* 128* 115* 121* 129*   Lipid Profile: No results for input(s): CHOL, HDL, LDLCALC, TRIG, CHOLHDL, LDLDIRECT in the last 72 hours. Thyroid Function Tests: No results for input(s): TSH, T4TOTAL, FREET4, T3FREE, THYROIDAB in the last 72 hours. Anemia Panel: No results for input(s): VITAMINB12, FOLATE, FERRITIN, TIBC, IRON, RETICCTPCT in the last 72 hours. Sepsis Labs: No results for input(s): PROCALCITON, LATICACIDVEN in the last 168 hours.  Recent Results (from the past 240 hour(s))  SARS CORONAVIRUS 2 (TAT 6-24 HRS) Nasopharyngeal Nasopharyngeal Swab     Status: None   Collection Time: 06/12/19  1:00 AM   Specimen: Nasopharyngeal Swab  Result Value Ref Range Status   SARS Coronavirus 2 NEGATIVE NEGATIVE Final    Comment: (NOTE) SARS-CoV-2 target nucleic acids are NOT DETECTED. The  SARS-CoV-2 RNA is generally detectable in upper and lower respiratory specimens during the acute phase of infection. Negative results do not preclude SARS-CoV-2 infection, do not rule out co-infections with other pathogens, and should not be used as the sole basis for treatment or other patient management decisions. Negative results must be combined with clinical observations, patient history, and epidemiological information. The expected result is Negative. Fact Sheet for Patients: SugarRoll.be Fact Sheet for Healthcare Providers: https://www.woods-mathews.com/ This test is not yet approved or cleared by the [place] FDA and  has been authorized for detection and/or diagnosis of SARS-CoV-2 by FDA under an Emergency Use Authorization (EUA). This EUA will remain  in effect (meaning this test can be used) for the duration of the COVID-19 declaration under Section 56 4(b)(1) of the Act, 21 U.S.C. section 360bbb-3(b)(1), unless the authorization is terminated or revoked sooner. Performed at [facility], [place] [address], [place],  [address]  MRSA PCR Screening     Status: None   Collection Time: 06/12/19 11:00 PM   Specimen: Nasopharyngeal  Result Value Ref Range Status   MRSA by PCR NEGATIVE NEGATIVE Final    Comment:        The GeneXpert MRSA Assay (FDA approved for NASAL specimens only), is one component of a comprehensive MRSA colonization surveillance program. It is not intended to diagnose MRSA infection nor to guide or monitor treatment for MRSA infections. Performed at [facility], [address], [place], [place] [address]          Radiology Studies: Dg Chest [place] 1 View  Result Date: 06/15/2019 CLINICAL DATA:  New PICC line placement EXAM: PORTABLE CHEST 1 VIEW COMPARISON:  06/13/2019 FINDINGS: Right arm PICC line tip is at the cavoatrial junction. There is a left chest wall pacer device with  leads in the right atrial appendage and right ventricle. Stable cardiac bilateral pleural effusions and interstitial edema appears similar to previous exam. IMPRESSION: 1. The right arm PICC line tip is at the cavoatrial junction. 2. Stable cardiac enlargement, interstitial edema and pleural effusions. Electronically Signed   By: Kerby Moors M.D.   On: 06/15/2019 10:14   [place] Ekg Site Rite  Result Date: 06/14/2019 If Site Rite image not attached, placement could not be confirmed due to current cardiac rhythm.       Scheduled Meds:  aspirin EC  81 mg Oral Daily   atorvastatin  80 mg Oral Daily   balsalazide  750 mg Oral TID   Chlorhexidine Gluconate Cloth  6 each Topical Q0600   feeding supplement (NEPRO CARB STEADY)  237 mL Oral BID BM   finasteride  5 mg Oral Daily   furosemide  60 mg Intravenous Q12H   insulin aspart  0-9 Units Subcutaneous TID WC   insulin detemir  9 Units Subcutaneous Daily   lactobacillus  1 g Oral TID WC   midodrine  10 mg Oral TID   multivitamin-lutein   Oral Daily   niacin  1,000 mg Oral Daily   pantoprazole  40 mg Oral Daily   potassium chloride  10 mEq Oral Daily   rivaroxaban  20 mg Oral Daily   sodium chloride flush  10-40 mL Intracatheter Q12H   sodium chloride flush  3 mL Intravenous Q12H   tamsulosin  0.4 mg Oral QPC breakfast   Continuous Infusions:  sodium chloride       LOS: 3 days    Time spent: 35 min    Nicolette Bang, MD [facility]  If 7PM-7AM, please contact night-coverage  06/15/2019, 4:40 PM

## 2019-06-15 NOTE — Progress Notes (Signed)
Peripherally Inserted Central Catheter/Midline Placement  The IV Nurse has discussed with the patient and/or persons authorized to consent for the patient, the purpose of this procedure and the potential benefits and risks involved with this procedure.  The benefits include less needle sticks, lab draws from the catheter, and the patient may be discharged home with the catheter. Risks include, but not limited to, infection, bleeding, blood clot (thrombus formation), and puncture of an artery; nerve damage and irregular heartbeat and possibility to perform a PICC exchange if needed/ordered by physician.  Alternatives to this procedure were also discussed.  Bard Power PICC patient education guide, fact sheet on infection prevention and patient information card has been provided to patient /or left at bedside.    PICC/Midline Placement Documentation  PICC Double Lumen 06/15/19 PICC Right Brachial 42 cm 0 cm (Active)  Indication for Insertion or Continuance of Line Vasoactive infusions 06/15/19 0923  Exposed Catheter (cm) 0 cm 06/15/19 8110  Site Assessment Clean;Dry;Intact 06/15/19 0923  Lumen #1 Status Flushed;Saline locked;Blood return noted 06/15/19 0923  Lumen #2 Status Flushed;Saline locked;Blood return noted 06/15/19 0923  Dressing Type Transparent 06/15/19 0923  Dressing Status Clean;Dry;Intact;Antimicrobial disc in place 06/15/19 [place] checked and tightened 06/15/19 0923  Line Adjustment (NICU/IV Team Only) No 06/15/19 0923  Dressing Intervention New dressing 06/15/19 0923  Dressing Change Due 06/22/19 06/15/19 0923       Rolena Infante 06/15/2019, 9:24 AM

## 2019-06-15 NOTE — Progress Notes (Signed)
Message left for Tiffany RN that PICC is good to use.

## 2019-06-15 NOTE — Progress Notes (Addendum)
Progress Note  Patient Name: Colton Lamb Date of Encounter: 06/15/2019  Primary Cardiologist: Rockey Situ  Subjective   No specific complaints this morning. He feels like some of his lower extremity swelling is improving. Documented UOP of 500 mL for the past 24 hours with a net - 1.6 L for the admission. Weight 120.7-->119.8 kg. BUN/SCr 23/0.95-->23/1.0, K+ 4.2. HGB 8.4-->7.9. BP remains soft. Started on IV albumin. PICC placed today.   Inpatient Medications    Scheduled Meds: . aspirin EC  81 mg Oral Daily  . atorvastatin  80 mg Oral Daily  . balsalazide  750 mg Oral TID  . Chlorhexidine Gluconate Cloth  6 each Topical Q0600  . feeding supplement (NEPRO CARB STEADY)  237 mL Oral BID BM  . finasteride  5 mg Oral Daily  . furosemide  60 mg Intravenous Q12H  . insulin aspart  0-9 Units Subcutaneous TID WC  . insulin detemir  9 Units Subcutaneous Daily  . lactobacillus  1 g Oral TID WC  . midodrine  10 mg Oral TID  . multivitamin-lutein   Oral Daily  . niacin  1,000 mg Oral Daily  . pantoprazole  40 mg Oral Daily  . potassium chloride  10 mEq Oral Daily  . rivaroxaban  20 mg Oral Daily  . sodium chloride flush  3 mL Intravenous Q12H  . tamsulosin  0.4 mg Oral QPC breakfast   Continuous Infusions: . sodium chloride    . albumin human 25 g (06/15/19 0548)   PRN Meds: sodium chloride, acetaminophen, albuterol, ALPRAZolam, guaiFENesin-dextromethorphan, ondansetron (ZOFRAN) IV, sodium chloride flush, zolpidem   Vital Signs    Vitals:   06/15/19 0100 06/15/19 0200 06/15/19 0424 06/15/19 0500  BP: 96/63 (!) 89/72  95/72  Pulse: 86 89  86  Resp: (!) 28 (!) 31  (!) 33  Temp:      TempSrc:      SpO2: 100% 96%  100%  Weight:   119.8 kg   Height:        Intake/Output Summary (Last 24 hours) at 06/15/2019 0737 Last data filed at 06/15/2019 0500 Gross per 24 hour  Intake 550.22 ml  Output 1085 ml  Net -534.78 ml   Filed Weights   06/13/19 0400 06/14/19 0423  06/15/19 0424  Weight: 119.4 kg 120.7 kg 119.8 kg    Telemetry    Afib, 70s to 90s bpm, rare PVC - Personally Reviewed  ECG    No new tracings - Personally Reviewed  Physical Exam   GEN: No acute distress.   Neck: JVD difficult to assess secondary to body habitus. Cardiac: Irregularly irregular, no murmurs, rubs, or gallops.  Respiratory: Clear to auscultation bilaterally.  GI: Soft, nontender, non-distended.   MS: 2+ bilateral woody edema to the thighs; No deformity. Neuro:  Alert and oriented x 3; Nonfocal.  Psych: Normal affect.  Labs    Chemistry Recent Labs  Lab 06/12/19 0100 06/13/19 0619 06/14/19 0355 06/15/19 0548  NA 142 140 144 142  K 4.0 3.3* 3.8 4.2  CL 89* 90* 93* 92*  CO2 42* 39* 41* 39*  GLUCOSE 127* 117* 113* 125*  BUN 24* 23 23 23   CREATININE 0.98 0.92 0.95 1.00  CALCIUM 8.6* 8.2* 8.3* 8.5*  PROT 6.6  --   --   --   ALBUMIN 2.9*  --   --   --   AST 27  --   --   --   ALT 15  --   --   --  ALKPHOS 160*  --   --   --   BILITOT 0.8  --   --   --   GFRNONAA >60 >60 >60 >60  GFRAA >60 >60 >60 >60  ANIONGAP 11 11 10 11      Hematology Recent Labs  Lab 06/13/19 0619 06/14/19 0355 06/15/19 0548  WBC 11.1* 11.0* 11.4*  RBC 3.49* 3.42* 3.17*  HGB 8.7* 8.4* 7.9*  HCT 29.3* 28.8* 27.3*  MCV 84.0 84.2 86.1  MCH 24.9* 24.6* 24.9*  MCHC 29.7* 29.2* 28.9*  RDW 17.1* 17.1* 17.1*  PLT 340 347 298    Cardiac EnzymesNo results for input(s): TROPONINI in the last 168 hours. No results for input(s): TROPIPOC in the last 168 hours.   BNP Recent Labs  Lab 06/12/19 0100  BNP 731.0*     DDimer No results for input(s): DDIMER in the last 168 hours.   Radiology    Dg Chest Port 1 View  Result Date: 06/13/2019 IMPRESSION: Cardiac pacer in stable position. Cardiomegaly. Diffuse bilateral pulmonary infiltrates/edema and bilateral pleural effusions. Bibasilar atelectasis. Similar findings noted on prior exam. Electronically Signed   By: [place]   On: 06/13/2019 09:12    Cardiac Studies   2D Echo 01/2019: 1. The left ventricle has moderately reduced systolic function, with an ejection fraction of 35-40%. The cavity size was normal. Left ventricular diastolic Doppler parameters are indeterminate. Probably severe hypokinesis of mid-distal anterior,  ateroseptal and apical myocardium. 2. The right ventricle has normal systolic function. The cavity was normal. There is no increase in right ventricular wall thickness. 3. Left atrial size was moderately dilated. 4. Very poor image quality in spite of using Definity. Valves not well visualized. Consider different diagnostic testing if indicated. 5. The aortic valve was not well visualized. Aortic valve regurgitation was not assessed by color flow Doppler.  Patient Profile     68 y.o. 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  Assessment & Plan    1. Acute on chronic combined systolic and diastolic CHF: -Massively volume up on exam, though cannot exclude some component of 3rd spacing in the setting of hypoalbuminemia and anemia -Continue TED hose/SCDs -Documented UOP remains low -Continue midodrine 10 mg tid -Continue IV Lasix 60 mg bid -Add metolazone 2.5 mg x 1 today -Continue IV albumin -Consider pRBC as below  -May need to consider addition of vasopressor support to augment diuresis, though weaning him from this would be difficult, Wrenley Sayed discuss with MD -BB/ACEi/ARB/Entresto/MRA on hold secondary to hypotension  -Escalate evidence based therapy as able  -Daily standing weights -Strict I/O  2. Persistent Afib: -He remains in Afib with controlled ventricular response -Beta blocker on hold in the setting of hypotension as above -Xarelto -Has been in Afib for ~ past 12 months -Pursue rate control strategy  -Estimated Creatinine Clearance: 94.5 mL/min (by C-G formula based on SCr of 1 mg/dL).  3. CAD involving the native coronary arteries without angina/elevated HS-Tn: -Initial HS-Tn of 34, not cycled -No chest pain -ASA -Lipitor  4. OSA: -CPAP  5. Anemia: -  Down trending to 7.9 -Recommend HGB > 8.5 -Likely contributing to symptoms   6. Hypokalemia: -Repleted  7. Hypotension: -Likely multifactorial including anemia, hypoalbuminemia, deconditioning in the setting of multiple admissions over the past year, sacral wound -Consider stress dose steroids -If no improvement, may need vasopressor support  -20 pounds weight loss over the past year -Likely contributing to minimal diuresis thus far -IV albumin      For questions or updates, please contact [facility] Please consult [email or web address] for contact info under Cardiology/STEMI.    Signed, Christell Faith, PA-C [facility] Pager: [phone] 06/15/2019, 7:37 AM   I have seen and examined this patient with Christell Faith.  Agree with above, note added to reflect my findings.  On exam, irregular, lungs clear, 3+ edema. Remains massively volume overloaded. Imoni Kohen give a dose of metolazone to see if that improves urine output. If this does not help may need lasix drip.    Joshaua Epple M. Amiria Orrison MD 06/15/2019 12:47 PM

## 2019-06-16 DIAGNOSIS — J9600 Acute respiratory failure, unspecified whether with hypoxia or hypercapnia: Secondary | ICD-10-CM | POA: Diagnosis not present

## 2019-06-16 LAB — GLUCOSE, CAPILLARY
Glucose-Capillary: 108 mg/dL — ABNORMAL HIGH (ref 70–99)
Glucose-Capillary: 173 mg/dL — ABNORMAL HIGH (ref 70–99)
Glucose-Capillary: 141 mg/dL — ABNORMAL HIGH (ref 70–99)
Glucose-Capillary: 156 mg/dL — ABNORMAL HIGH (ref 70–99)
Glucose-Capillary: 164 mg/dL — ABNORMAL HIGH (ref 70–99)

## 2019-06-16 LAB — CBC WITH DIFFERENTIAL/PLATELET
Abs Immature Granulocytes: 0.05 10*3/uL (ref 0.00–0.07)
Basophils Absolute: 0.1 10*3/uL (ref 0.0–0.1)
Basophils Relative: 1 %
Eosinophils Absolute: 0.4 10*3/uL (ref 0.0–0.5)
Eosinophils Relative: 3 %
HCT: 27.2 % — ABNORMAL LOW (ref 39.0–52.0)
Hemoglobin: 7.8 g/dL — ABNORMAL LOW (ref 13.0–17.0)
Immature Granulocytes: 0 %
Lymphocytes Relative: 9 %
Lymphs Abs: 1.1 10*3/uL (ref 0.7–4.0)
MCH: 24.5 pg — ABNORMAL LOW (ref 26.0–34.0)
MCHC: 28.7 g/dL — ABNORMAL LOW (ref 30.0–36.0)
MCV: 85.3 fL (ref 80.0–100.0)
Monocytes Absolute: 0.7 10*3/uL (ref 0.1–1.0)
Monocytes Relative: 6 %
Neutro Abs: 9.9 10*3/uL — ABNORMAL HIGH (ref 1.7–7.7)
Neutrophils Relative %: 81 %
Platelets: 308 10*3/uL (ref 150–400)
RBC: 3.19 MIL/uL — ABNORMAL LOW (ref 4.22–5.81)
RDW: 17 % — ABNORMAL HIGH (ref 11.5–15.5)
WBC: 12.1 10*3/uL — ABNORMAL HIGH (ref 4.0–10.5)
nRBC: 0 % (ref 0.0–0.2)

## 2019-06-16 LAB — BASIC METABOLIC PANEL
Anion gap: 10 (ref 5–15)
BUN: 25 mg/dL — ABNORMAL HIGH (ref 8–23)
CO2: 41 mmol/L — ABNORMAL HIGH (ref 22–32)
Calcium: 8.6 mg/dL — ABNORMAL LOW (ref 8.9–10.3)
Chloride: 90 mmol/L — ABNORMAL LOW (ref 98–111)
Creatinine, Ser: 1 mg/dL (ref 0.61–1.24)
GFR calc Af Amer: >60 mL/min (ref >60)
GFR calc non Af Amer: >60 mL/min (ref >60)
Glucose, Bld: 120 mg/dL — ABNORMAL HIGH (ref 70–99)
Potassium: 3.7 mmol/L (ref 3.5–5.1)
Sodium: 141 mmol/L (ref 135–145)

## 2019-06-16 MED ORDER — METOLAZONE 2.5 MG PO TABS
2.5000 mg | ORAL_TABLET | Freq: Once | ORAL | Status: AC
  Administered 2019-06-16: 2.5 mg via ORAL
  Filled 2019-06-16 (×2): qty 1

## 2019-06-16 MED ORDER — POTASSIUM CHLORIDE CRYS ER 20 MEQ PO TBCR
20.0000 meq | EXTENDED_RELEASE_TABLET | Freq: Once | ORAL | Status: AC
  Administered 2019-06-16: 20 meq via ORAL
  Filled 2019-06-16: qty 1

## 2019-06-16 MED ORDER — ALBUMIN HUMAN 25 % IV SOLN
25.0000 g | Freq: Four times a day (QID) | INTRAVENOUS | Status: AC
  Administered 2019-06-16 – 2019-06-17 (×4): 25 g via INTRAVENOUS
  Filled 2019-06-16: qty 100
  Filled 2019-06-16: qty 50
  Filled 2019-06-16: qty 100
  Filled 2019-06-16 (×3): qty 50

## 2019-06-16 NOTE — Consult Note (Signed)
[place] for Electrolyte Monitoring and Replacement   Colton Lamb is a 66 YOM with a hx of CHF, CAD, HTN, dyslipidemia, and Crohn's Disease who presented to the ED with acute onset of worsening dyspnea associated with dry cough and wheezing. He was placed on BiPAP and admitted to stepdown for further evaluation.  Pharmacy has been consulted for electrolyte monitoring and replacement.  Patient is receiving 60 mg IV Lasix q12h. He is on a carb modified diet and also getting NEPRO CARB STEADY 231m BID between meals. He is also receiving potassium chloride 133m PO daily inpatient (PTA med).  Recent Labs: Potassium (mmol/L)  Date Value  06/16/2019 3.7  11/22/2013 3.7   Magnesium (mg/dL)  Date Value  06/14/2019 2.2  11/22/2013 1.8   Calcium (mg/dL)  Date Value  06/16/2019 8.6 (L)   Calcium, Total (mg/dL)  Date Value  11/22/2013 9.0   Albumin (g/dL)  Date Value  06/12/2019 2.9 (L)  04/18/2018 4.3   Phosphorus (mg/dL)  Date Value  04/21/2019 4.8 (H)   Sodium (mmol/L)  Date Value  06/16/2019 141  05/09/2016 141  11/22/2013 133 (L)   Corrected Calcium: 9.5 mg/dL  Pt received a dose of metolazone on 11/14  Assessment/Plan: - K 3.7 - trending down, on lasix. Will order an extra KCl 20 mEq PO x1  - Will check BMP and Mag with AM labs and adjust as necessary.  Goals of Therapy:  - K ~ 4 - Mg ~ 2 - Other electrolytes WNL   Thank you for allowing pharmacy to be a part of this patient's care.   WaRocky MorelPharmD, BCPS 06/16/2019 8:13 AM

## 2019-06-16 NOTE — Progress Notes (Addendum)
Progress Note  Patient Name: Colton Lamb Date of Encounter: 06/16/2019  Primary Cardiologist: Rockey Situ  Subjective   Dyspnea "about the same." No chest pain or palpitations. Remains in Afib with controlled ventricular response. Given one-time dose of metolazone 2.5 mg on 11/14 with documented UOP of 3 L for the past 24 hours with a net - 4.6 L for the admission. Weight 119.8 kg-->117.7 kg. BUN/SCr 23/1.00-->25/1.00. Potassium 4.2-->3.7. HGB 7.9-->7.8.   Inpatient Medications    Scheduled Meds: . aspirin EC  81 mg Oral Daily  . atorvastatin  80 mg Oral Daily  . balsalazide  750 mg Oral TID  . Chlorhexidine Gluconate Cloth  6 each Topical Q0600  . feeding supplement (NEPRO CARB STEADY)  237 mL Oral BID BM  . finasteride  5 mg Oral Daily  . furosemide  60 mg Intravenous Q12H  . insulin aspart  0-9 Units Subcutaneous TID WC  . insulin detemir  9 Units Subcutaneous Daily  . lactobacillus  1 g Oral TID WC  . midodrine  10 mg Oral TID  . multivitamin-lutein   Oral Daily  . niacin  1,000 mg Oral Daily  . pantoprazole  40 mg Oral Daily  . potassium chloride  10 mEq Oral Daily  . potassium chloride  20 mEq Oral Once  . rivaroxaban  20 mg Oral Daily  . sodium chloride flush  10-40 mL Intracatheter Q12H  . sodium chloride flush  3 mL Intravenous Q12H  . tamsulosin  0.4 mg Oral QPC breakfast   Continuous Infusions: . sodium chloride     PRN Meds: sodium chloride, acetaminophen, albuterol, ALPRAZolam, guaiFENesin-dextromethorphan, ondansetron (ZOFRAN) IV, sodium chloride flush, sodium chloride flush, zolpidem   Vital Signs    Vitals:   06/16/19 0300 06/16/19 0400 06/16/19 0500 06/16/19 0600  BP: 98/66 94/65 (!) 98/57 99/69  Pulse: 88 86 84 82  Resp: (!) 25 (!) 30 (!) 23 19  Temp:      TempSrc:      SpO2: 100% 100% 100% 99%  Weight:   117.7 kg   Height:        Intake/Output Summary (Last 24 hours) at 06/16/2019 0842 Last data filed at 06/16/2019 0530 Gross per 24  hour  Intake -  Output 3000 ml  Net -3000 ml   Filed Weights   06/14/19 0423 06/15/19 0424 06/16/19 0500  Weight: 120.7 kg 119.8 kg 117.7 kg    Telemetry    Afib, 80s to low 100s bpm - Personally Reviewed  ECG    No new tracings - Personally Reviewed  Physical Exam   GEN: No acute distress.   Neck: JVD difficult to assess secondary to body habitus. Cardiac: Irregularly irregular, no murmurs, rubs, or gallops.  Respiratory: Clear to auscultation bilaterally.  GI: Soft, nontender, non-distended.   MS: 2+ bilateral woody edema to the thighs; No deformity. Neuro:  Alert and oriented x 3; Nonfocal.  Psych: Normal affect.  Labs    Chemistry Recent Labs  Lab 06/12/19 0100  06/14/19 0355 06/15/19 0548 06/16/19 0531  NA 142   < > 144 142 141  K 4.0   < > 3.8 4.2 3.7  CL 89*   < > 93* 92* 90*  CO2 42*   < > 41* 39* 41*  GLUCOSE 127*   < > 113* 125* 120*  BUN 24*   < > 23 23 25*  CREATININE 0.98   < > 0.95 1.00 1.00  CALCIUM 8.6*   < >  8.3* 8.5* 8.6*  PROT 6.6  --   --   --   --   ALBUMIN 2.9*  --   --   --   --   AST 27  --   --   --   --   ALT 15  --   --   --   --   ALKPHOS 160*  --   --   --   --   BILITOT 0.8  --   --   --   --   GFRNONAA >60   < > >60 >60 >60  GFRAA >60   < > >60 >60 >60  ANIONGAP 11   < > 10 11 10    < > = values in this interval not displayed.     Hematology Recent Labs  Lab 06/14/19 0355 06/15/19 0548 06/16/19 0531  WBC 11.0* 11.4* 12.1*  RBC 3.42* 3.17* 3.19*  HGB 8.4* 7.9* 7.8*  HCT 28.8* 27.3* 27.2*  MCV 84.2 86.1 85.3  MCH 24.6* 24.9* 24.5*  MCHC 29.2* 28.9* 28.7*  RDW 17.1* 17.1* 17.0*  PLT 347 298 308    Cardiac EnzymesNo results for input(s): TROPONINI in the last 168 hours. No results for input(s): TROPIPOC in the last 168 hours.   BNP Recent Labs  Lab 06/12/19 0100  BNP 731.0*     DDimer No results for input(s): DDIMER in the last 168 hours.   Radiology    Dg Chest Port 1 View  Result Date: 06/15/2019  IMPRESSION: 1. The right arm PICC line tip is at the cavoatrial junction. 2. Stable cardiac enlargement, interstitial edema and pleural effusions. Electronically Signed   By: Kerby Moors M.D.   On: 06/15/2019 10:14   Cardiac Studies   2D Echo 01/2019: 1. The left ventricle has moderately reduced systolic function, with an ejection fraction of 35-40%. The cavity size was normal. Left ventricular diastolic Doppler parameters are indeterminate. Probably severe hypokinesis of mid-distal anterior,  ateroseptal and apical myocardium. 2. The right ventricle has normal systolic function. The cavity was normal. There is no increase in right ventricular wall thickness. 3. Left atrial size was moderately dilated. 4. Very poor image quality in spite of using Definity. Valves not well visualized. Consider different diagnostic testing if indicated. 5. The aortic valve was not well visualized. Aortic valve regurgitation was not assessed by color flow Doppler.  Patient Profile     68 y.o. 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  Assessment & Plan    1. Acute on chronic combined systolic and diastolic CHF: -Massively volume up on exam, though cannot exclude some component of 3rd spacing in the setting of hypoalbuminemia and anemia -Continue TED hose/SCDs -Improved UOP with metolazone 2.5 mg on 11/14 with stable renal function -Azizah Lisle give another dose of metolazone 2.5 mg today with KCl -If he starts to bump his renal function with this, may need to transition to Lasix gtt -Continue  midodrine 10 mg tid -Continue IV Lasix 60 mg bid -Status post IV albumin -Consider pRBC as below  -May  need to consider addition of vasopressor support to augment diuresis if the above regimen is unsuccessful, though weaning him from this would be difficult, Maat Kafer discuss with MD -BB/ACEi/ARB/Entresto/MRA on hold secondary to hypotension  -Escalate evidence based therapy as able -Daily standing weights -Strict I/O  2. Persistent Afib: -He remains in Afib with controlled ventricular response -Beta blocker on hold in the setting of hypotension as above -Xarelto -Has been in Afib for ~ past 12 months -Pursue rate control strategy  -Estimated Creatinine Clearance: 93.6 mL/min (by C-G formula based on SCr of 1 mg/dL).  3. CAD involving the native coronary arteries without angina/elevated HS-Tn: -Initial HS-Tn of 34, not cycled -No chest pain -ASA -Lipitor  4. OSA: -CPAP  5. Anemia: -Low, though stable at 7.8 -Recommend HGB > 8.5 -Likely contributing to symptoms   6. Hypokalemia: -Maintain goal of 4.0  7. Hypotension: -Likely multifactorial including anemia, hypoalbuminemia, deconditioning in the setting of multiple admissions over the past year, sacral wound -Consider stress dose steroids -If no improvement, may need vasopressor support  -20 pounds weight loss over the past year -Status post IV albumin   For questions or updates, please contact [facility] Please consult [email or web address] for contact info under Cardiology/STEMI.    Signed, Christell Faith, PA-C [facility] Pager: [phone] 06/16/2019, 8:42 AM  I have seen and examined this patient with Christell Faith.  Agree with above, note added to reflect my findings.  On exam, irregular, no murmurs, 2-3+ edema to the thigh.  Continued severe lower extremity edema.  Fortunately, he has been urinating after a dose of metolazone yesterday.  We Terren Haberle continue with metolazone as he was out 3 L yesterday.  His creatinine  has remained stable.  Hailyn Zarr M. Bertine Schlottman MD 06/16/2019 1:17 PM

## 2019-06-16 NOTE — Progress Notes (Signed)
PROGRESS NOTE    KHAYRI KARGBO  IHW:[id number] DOB: [birth year] DOA: 06/12/2019 PCP: [name], MD   Brief Narrative:  [name] a68 y.o.obese Caucasian malewith a known history of systolic CHF, coronary artery disease hypertension dyslipidemia and Crohn's disease, presented to the emergency room with acute onset of worsening dyspnea with associated dry cough and wheezing for the last 24 hours without fever or chills. He he admitted to orthopnea and paroxysmal nocturnal dyspnea as well as dyspnea on exertion with lower extremity edema. No fever or chills. No nausea or vomiting. No dysuria, oliguria or hematuria or flank pain. No recent exposure to COVID-19. He was tested negative about a week ago   Assessment & Plan:   Active Problems:   Coronary artery disease involving native coronary artery of native heart without angina pectoris   Acute respiratory failure ([place])   1. Acute on chronic systolic CHF with subsequent acute hypoxic and likely hypercarbic respiratory failure requiring BiPAP. - weaned offbipap - c/w diuresis, limited by hypotension -  Received albumin 25g q6 x 4 doses 11/12, repeat 11/15 - lasix 60 iv bid - cards added metolazone 11/14 with good response, repeat 11/15 -pressors if inadequate response -PICC placed - appreciate cards consult - strict I/o's  2. Type 2 diabetes mellitus. - c/wsliding scale insulin,  -check blood glucose AC at bedtime, -diabetic diet  3. Dyslipidemia. -Lipitor 80 mg daily  4. Crohn's disease. -balsalazide.  5. BPH. -continue Proscar and Flomax.  6. Stage 4 sacrum decubiti POA; -wound care consult appreciated, continue with Aquacel, Kerlix, ABD pad and change daily, agree with repositioning every 2 hours  DVT prophylaxis: KJ:[id number]   Code Status: full    Code Status Orders  (From admission, onward)         Start     Ordered   06/12/19 0351  Full code  Continuous      06/12/19 0359        Code Status History    Date Active Date Inactive Code Status Order ID Comments User Context   04/21/2019 1857 04/26/2019 2045 Full Code [id number]  Demetrios Loll, MD Inpatient   03/04/2019 2021 03/19/2019 2042 Full Code [id number]  [name], MD Inpatient   09/07/2016 0100 09/09/2016 2001 Full Code [id number]  Harvie Bridge, DO Inpatient   04/16/2014 1552 04/17/2014 1512 Full Code [id number]  Evans Lance, MD Inpatient   12/11/2013 2042 12/14/2013 1516 Full Code [id number]  Eileen Stanford, PA-C Inpatient   11/22/2013 1434 11/30/2013 2000 Full Code [id number]  Belva Crome, MD Inpatient   11/22/2013 0916 11/22/2013 1434 Full Code [id number]  Dorothy Spark, MD Inpatient   11/22/2013 0829 11/22/2013 0916 Full Code [id number]  Barrett, Evelene Croon, PA-C Inpatient   Advance Care Planning Activity     Family Communication: called son updated on status Disposition Plan:   Patient remained inpatient for continued IV diuresis, possible addition of vasopressors. Consults called: None Admission status: Inpatient   Consultants:   Cardiology  Procedures:  Dg Chest Port 1 View  Result Date: 06/15/2019 CLINICAL DATA:  New PICC line placement EXAM: PORTABLE CHEST 1 VIEW COMPARISON:  06/13/2019 FINDINGS: Right arm PICC line tip is at the cavoatrial junction. There is a left chest wall pacer device with leads in the right atrial appendage and right ventricle. Stable cardiac bilateral pleural effusions and interstitial edema appears similar to previous exam. IMPRESSION: 1. The right arm PICC line tip is at the cavoatrial junction. 2. Stable cardiac enlargement, interstitial edema  and pleural effusions. Electronically Signed   By: Kerby Moors M.D.   On: 06/15/2019 10:14   Dg Chest Port 1 View  Result Date: 06/13/2019 CLINICAL DATA:  CHF. EXAM: PORTABLE CHEST 1 VIEW COMPARISON:  06/12/2019. FINDINGS: Cardiac pacer noted stable position. Cardiomegaly. Bibasilar atelectasis. Diffuse  bilateral pulmonary infiltrates/edema and bilateral pleural effusions. Degenerative change thoracic spine. IMPRESSION: Cardiac pacer in stable position. Cardiomegaly. Diffuse bilateral pulmonary infiltrates/edema and bilateral pleural effusions. Bibasilar atelectasis. Similar findings noted on prior exam. Electronically Signed   By: [place]   On: 06/13/2019 09:12   Dg Chest Portable 1 View  Result Date: 06/12/2019 CLINICAL DATA:  Respiratory distress EXAM: PORTABLE CHEST 1 VIEW COMPARISON:  05/18/2019 FINDINGS: Cardiac shadow remains enlarged. Defibrillator is again noted. Mild vascular congestion is again seen with likely posteriorly layering effusions. Mild bibasilar atelectatic changes are noted increased from the prior study. This may be related to a poor inspiratory effort. No bony abnormality is seen. IMPRESSION: Vascular congestion and small pleural effusions. Bibasilar atelectatic changes new from the prior exam. Electronically Signed   By: Inez Catalina M.D.   On: 06/12/2019 01:18   Dg Chest Portable 1 View  Result Date: 05/18/2019 CLINICAL DATA:  68 year old male with shortness of breath and altered mental status. EXAM: PORTABLE CHEST 1 VIEW COMPARISON:  Chest radiograph dated 04/30/2019 FINDINGS: There is moderate cardiomegaly with vascular congestion. Small bilateral pleural effusions and bibasilar atelectasis. Pneumonia is not excluded. Clinical correlation is recommended. Overall similar appearance of the cardiomediastinal silhouette and vascular congestion and edema to the radiograph of 04/30/2019. Right upper lobe opacity, similar to prior radiograph and likely combination of pleural effusion and associated atelectasis. There is no pneumothorax. Atherosclerotic calcification of the aortic arch. Left pectoral AICD device. No acute osseous pathology. IMPRESSION: Cardiomegaly with vascular congestion and bilateral pleural effusions overall similar to the radiograph of 04/30/2019.  Electronically Signed   By: Anner Crete M.D.   On: 05/18/2019 15:58   [place] Ekg Site Rite  Result Date: 06/14/2019 If Site Rite image not attached, placement could not be confirmed due to current cardiac rhythm.    Antimicrobials:   None   Subjective: Remains in ICU significant edema Addition of vasopressors to facilitate diuresis blood pressure soft and is in 80s  Objective: Vitals:   06/16/19 1000 06/16/19 1100 06/16/19 1200 06/16/19 1300  BP: 107/73 96/71 94/62  100/64  Pulse: 88 97 (!) 101 100  Resp: (!) 21 (!) 26 (!) 25 (!) 27  Temp:   97.8 F (36.6 C)   TempSrc:   Axillary   SpO2: 98% 99% 95% 99%  Weight:      Height:        Intake/Output Summary (Last 24 hours) at 06/16/2019 1559 Last data filed at 06/16/2019 1200 Gross per 24 hour  Intake 40.33 ml  Output 3875 ml  Net -3834.67 ml   Filed Weights   06/14/19 0423 06/15/19 0424 06/16/19 0500  Weight: 120.7 kg 119.8 kg 117.7 kg    Examination:  General exam: Appears calm and comfortable  Respiratory system: Difficult exam secondary to body habitus but rales noted, rhonchi noted cardiovascular system: S1 & S2 heard, RRR. No JVD, murmurs, rubs, gallops or clicks. No pedal edema. Gastrointestinal system: Abdomen is nondistended, soft and nontender. No organomegaly or masses felt. Normal bowel sounds heard. Central nervous system: Alert and oriented. No focal neurological deficits. Extremities: 2-3+ pitting edema bilaterally up to upper thigh, warm and well-perfused. Skin: No rashes, lesions or ulcers  Psychiatry: Judgement and insight appear normal. Mood & affect appropriate.     Data Reviewed: I have personally reviewed following labs and imaging studies  CBC: Recent Labs  Lab 06/12/19 0642 06/13/19 0619 06/14/19 0355 06/15/19 0548 06/16/19 0531  WBC 14.9* 11.1* 11.0* 11.4* 12.1*  NEUTROABS 12.8* 9.1* 8.9* 9.1* 9.9*  HGB 8.8* 8.7* 8.4* 7.9* 7.8*  HCT 29.4* 29.3* 28.8* 27.3* 27.2*  MCV 84.2 84.0  84.2 86.1 85.3  PLT 337 340 347 298 212   Basic Metabolic Panel: Recent Labs  Lab 06/12/19 0100 06/13/19 0619 06/14/19 0355 06/15/19 0548 06/16/19 0531  NA 142 140 144 142 141  K 4.0 3.3* 3.8 4.2 3.7  CL 89* 90* 93* 92* 90*  CO2 42* 39* 41* 39* 41*  GLUCOSE 127* 117* 113* 125* 120*  BUN 24* 23 23 23  25*  CREATININE 0.98 0.92 0.95 1.00 1.00  CALCIUM 8.6* 8.2* 8.3* 8.5* 8.6*  MG  --  1.8 2.2  --   --    GFR: Estimated Creatinine Clearance: 93.6 mL/min (by C-G formula based on SCr of 1 mg/dL). Liver Function Tests: Recent Labs  Lab 06/12/19 0100  AST 27  ALT 15  ALKPHOS 160*  BILITOT 0.8  PROT 6.6  ALBUMIN 2.9*   No results for input(s): LIPASE, AMYLASE in the last 168 hours. No results for input(s): AMMONIA in the last 168 hours. Coagulation Profile: No results for input(s): INR, PROTIME in the last 168 hours. Cardiac Enzymes: No results for input(s): CKTOTAL, CKMB, CKMBINDEX, TROPONINI in the last 168 hours. BNP (last 3 results) No results for input(s): PROBNP in the last 8760 hours. HbA1C: No results for input(s): HGBA1C in the last 72 hours. CBG: Recent Labs  Lab 06/15/19 1144 06/15/19 1621 06/15/19 2145 06/16/19 0745 06/16/19 1222  GLUCAP 121* 129* 149* 108* 173*   Lipid Profile: No results for input(s): CHOL, HDL, LDLCALC, TRIG, CHOLHDL, LDLDIRECT in the last 72 hours. Thyroid Function Tests: No results for input(s): TSH, T4TOTAL, FREET4, T3FREE, THYROIDAB in the last 72 hours. Anemia Panel: No results for input(s): VITAMINB12, FOLATE, FERRITIN, TIBC, IRON, RETICCTPCT in the last 72 hours. Sepsis Labs: No results for input(s): PROCALCITON, LATICACIDVEN in the last 168 hours.  Recent Results (from the past 240 hour(s))  SARS CORONAVIRUS 2 (TAT 6-24 HRS) Nasopharyngeal Nasopharyngeal Swab     Status: None   Collection Time: 06/12/19  1:00 AM   Specimen: Nasopharyngeal Swab  Result Value Ref Range Status   SARS Coronavirus 2 NEGATIVE NEGATIVE Final      Comment: (NOTE) SARS-CoV-2 target nucleic acids are NOT DETECTED. The SARS-CoV-2 RNA is generally detectable in upper and lower respiratory specimens during the acute phase of infection. Negative results do not preclude SARS-CoV-2 infection, do not rule out co-infections with other pathogens, and should not be used as the sole basis for treatment or other patient management decisions. Negative results must be combined with clinical observations, patient history, and epidemiological information. The expected result is Negative. Fact Sheet for Patients: SugarRoll.be Fact Sheet for Healthcare Providers: https://www.woods-mathews.com/ This test is not yet approved or cleared by the [place] FDA and  has been authorized for detection and/or diagnosis of SARS-CoV-2 by FDA under an Emergency Use Authorization (EUA). This EUA will remain  in effect (meaning this test can be used) for the duration of the COVID-19 declaration under Section 56 4(b)(1) of the Act, 21 U.S.C. section 360bbb-3(b)(1), unless the authorization is terminated or revoked sooner. Performed at [facility],  [address], [place], [place] [address]   MRSA PCR Screening     Status: None   Collection Time: 06/12/19 11:00 PM   Specimen: Nasopharyngeal  Result Value Ref Range Status   MRSA by PCR NEGATIVE NEGATIVE Final    Comment:        The GeneXpert MRSA Assay (FDA approved for NASAL specimens only), is one component of a comprehensive MRSA colonization surveillance program. It is not intended to diagnose MRSA infection nor to guide or monitor treatment for MRSA infections. Performed at [facility], [address] [place], [place] [address]          Radiology Studies: Dg Chest [place] 1 View  Result Date: 06/15/2019 CLINICAL DATA:  New PICC line placement EXAM: PORTABLE CHEST 1 VIEW COMPARISON:  06/13/2019 FINDINGS: Right arm PICC line tip  is at the cavoatrial junction. There is a left chest wall pacer device with leads in the right atrial appendage and right ventricle. Stable cardiac bilateral pleural effusions and interstitial edema appears similar to previous exam. IMPRESSION: 1. The right arm PICC line tip is at the cavoatrial junction. 2. Stable cardiac enlargement, interstitial edema and pleural effusions. Electronically Signed   By: Kerby Moors M.D.   On: 06/15/2019 10:14   [place] Ekg Site Rite  Result Date: 06/14/2019 If Site Rite image not attached, placement could not be confirmed due to current cardiac rhythm.       Scheduled Meds:  aspirin EC  81 mg Oral Daily   atorvastatin  80 mg Oral Daily   balsalazide  750 mg Oral TID   Chlorhexidine Gluconate Cloth  6 each Topical Q0600   feeding supplement (NEPRO CARB STEADY)  237 mL Oral BID BM   finasteride  5 mg Oral Daily   furosemide  60 mg Intravenous Q12H   insulin aspart  0-9 Units Subcutaneous TID WC   insulin detemir  9 Units Subcutaneous Daily   lactobacillus  1 g Oral TID WC   midodrine  10 mg Oral TID   multivitamin-lutein   Oral Daily   niacin  1,000 mg Oral Daily   pantoprazole  40 mg Oral Daily   potassium chloride  10 mEq Oral Daily   rivaroxaban  20 mg Oral Daily   sodium chloride flush  10-40 mL Intracatheter Q12H   sodium chloride flush  3 mL Intravenous Q12H   tamsulosin  0.4 mg Oral QPC breakfast   Continuous Infusions:  sodium chloride     albumin human Stopped (06/16/19 1209)     LOS: 4 days    Time spent: 35 min    Nicolette Bang, MD [facility]  If 7PM-7AM, please contact night-coverage  06/16/2019, 3:59 PM

## 2019-06-17 DIAGNOSIS — I5023 Acute on chronic systolic (congestive) heart failure: Secondary | ICD-10-CM | POA: Diagnosis not present

## 2019-06-17 LAB — BASIC METABOLIC PANEL
Anion gap: 12 (ref 5–15)
BUN: 26 mg/dL — ABNORMAL HIGH (ref 8–23)
CO2: 39 mmol/L — ABNORMAL HIGH (ref 22–32)
Calcium: 8.9 mg/dL (ref 8.9–10.3)
Chloride: 88 mmol/L — ABNORMAL LOW (ref 98–111)
Creatinine, Ser: 0.98 mg/dL (ref 0.61–1.24)
GFR calc Af Amer: >60 mL/min (ref >60)
GFR calc non Af Amer: >60 mL/min (ref >60)
Glucose, Bld: 135 mg/dL — ABNORMAL HIGH (ref 70–99)
Potassium: 3.5 mmol/L (ref 3.5–5.1)
Sodium: 139 mmol/L (ref 135–145)

## 2019-06-17 LAB — CBC WITH DIFFERENTIAL/PLATELET
Abs Immature Granulocytes: 0.08 10*3/uL — ABNORMAL HIGH (ref 0.00–0.07)
Basophils Absolute: 0.1 10*3/uL (ref 0.0–0.1)
Basophils Relative: 1 %
Eosinophils Absolute: 0.3 10*3/uL (ref 0.0–0.5)
Eosinophils Relative: 2 %
HCT: 25.1 % — ABNORMAL LOW (ref 39.0–52.0)
Hemoglobin: 7.7 g/dL — ABNORMAL LOW (ref 13.0–17.0)
Immature Granulocytes: 1 %
Lymphocytes Relative: 7 %
Lymphs Abs: 0.9 10*3/uL (ref 0.7–4.0)
MCH: 24.8 pg — ABNORMAL LOW (ref 26.0–34.0)
MCHC: 30.7 g/dL (ref 30.0–36.0)
MCV: 81 fL (ref 80.0–100.0)
Monocytes Absolute: 0.7 10*3/uL (ref 0.1–1.0)
Monocytes Relative: 6 %
Neutro Abs: 10.6 10*3/uL — ABNORMAL HIGH (ref 1.7–7.7)
Neutrophils Relative %: 83 %
Platelets: 267 10*3/uL (ref 150–400)
RBC: 3.1 MIL/uL — ABNORMAL LOW (ref 4.22–5.81)
RDW: 17 % — ABNORMAL HIGH (ref 11.5–15.5)
WBC: 12.7 10*3/uL — ABNORMAL HIGH (ref 4.0–10.5)
nRBC: 0 % (ref 0.0–0.2)

## 2019-06-17 LAB — MAGNESIUM: Magnesium: 2 mg/dL (ref 1.7–2.4)

## 2019-06-17 LAB — GLUCOSE, CAPILLARY
Glucose-Capillary: 119 mg/dL — ABNORMAL HIGH (ref 70–99)
Glucose-Capillary: 131 mg/dL — ABNORMAL HIGH (ref 70–99)
Glucose-Capillary: 113 mg/dL — ABNORMAL HIGH (ref 70–99)
Glucose-Capillary: 116 mg/dL — ABNORMAL HIGH (ref 70–99)

## 2019-06-17 MED ORDER — POTASSIUM CHLORIDE CRYS ER 20 MEQ PO TBCR
40.0000 meq | EXTENDED_RELEASE_TABLET | Freq: Every day | ORAL | Status: DC
  Administered 2019-06-18 – 2019-06-25 (×8): 40 meq via ORAL
  Filled 2019-06-17 (×8): qty 2

## 2019-06-17 MED ORDER — SODIUM CHLORIDE 0.9 % IV SOLN
1.0000 g | Freq: Three times a day (TID) | INTRAVENOUS | Status: DC
  Administered 2019-06-17 – 2019-06-26 (×29): 1 g via INTRAVENOUS
  Filled 2019-06-17 (×37): qty 1

## 2019-06-17 MED ORDER — POTASSIUM CHLORIDE CRYS ER 20 MEQ PO TBCR
40.0000 meq | EXTENDED_RELEASE_TABLET | ORAL | Status: AC
  Administered 2019-06-17: 40 meq via ORAL
  Filled 2019-06-17: qty 2

## 2019-06-17 NOTE — Progress Notes (Signed)
PROGRESS NOTE    Colton Lamb  ZJQ:[id number] DOB: 29-May-1951 DOA: 06/12/2019 PCP: [name], MD   Brief Narrative:  [name] a68 y.o.obese Caucasian malewith a known history of systolic CHF, coronary artery disease hypertension dyslipidemia and Crohn's disease, presented to the emergency room with acute onset of worsening dyspnea with associated dry cough and wheezing for the last 24 hours without fever or chills. He he admitted to orthopnea and paroxysmal nocturnal dyspnea as well as dyspnea on exertion with lower extremity edema. No fever or chills. No nausea or vomiting. No dysuria, oliguria or hematuria or flank pain. No recent exposure to COVID-19. He was tested negative about a week ago   Assessment & Plan:   Active Problems:   Coronary artery disease involving native coronary artery of native heart without angina pectoris   Acute respiratory failure (Colton Lamb)   1. Acute on chronic systolic CHF with subsequent acute hypoxic and likely hypercarbic respiratory failure requiring BiPAP. - weaned offbipap - c/w diuresis, limited by hypotension -Receivedalbumin 25g q6 x 4 doses11/12, repeat 11/15 25g x 4 - lasix 60 iv bid - neg 8.4 L - cards added metolazone 11/14 with good response, repeat 11/15 - pressors if inadequate response - PICC placed - appreciate cards consult, they continue to follow - strict I/o's  2. Type 2 diabetes mellitus. - c/wsliding scale insulin,  -checking blood glucose AC at bedtime, -diabetic diet  3. Dyslipidemia. -Lipitor 80 mg daily  4. Crohn's disease. -balsalazide.  5. BPH. -continue Proscar and Flomax.  6. Stage 4 sacrum decubiti POA; -wound care consult appreciated, continue with Aquacel, Kerlix, ABD pad and change daily, agree with repositioning every 2 hours  DVT prophylaxis: WI:[id number]   Code Status: full    Code Status Orders  (From admission, onward)         Start      Ordered   06/12/19 0351  Full code  Continuous     06/12/19 0359        Code Status History    Date Active Date Inactive Code Status Order ID Comments User Context   04/21/2019 1857 04/26/2019 2045 Full Code [id number]  Colton Loll, MD Inpatient   03/04/2019 2021 03/19/2019 2042 Full Code [id number]  Colton Mandes, MD Inpatient   09/07/2016 0100 09/09/2016 2001 Full Code [id number]  Colton Bridge, DO Inpatient   04/16/2014 1552 04/17/2014 1512 Full Code [id number]  Evans Lance, MD Inpatient   12/11/2013 2042 12/14/2013 1516 Full Code [id number]  Colton Stanford, PA-C Inpatient   11/22/2013 1434 11/30/2013 2000 Full Code [id number]  Colton Crome, MD Inpatient   11/22/2013 0916 11/22/2013 1434 Full Code [id number]  Colton Spark, MD Inpatient   11/22/2013 0829 11/22/2013 0916 Full Code [id number]  Barrett, Colton Croon, PA-C Inpatient   Advance Care Planning Activity     Family Communication: none today Disposition Plan:   Patient will remain inpatient for continued IV diuresis, still with significant volume overload with soft blood pressures.  Will require continued diuresis as above, continued expert subspecialty consultation.  Patient not yet ready for discharge Consults called: None Admission status: Inpatient   Consultants:   Cardiology  Procedures:  Dg Chest Port 1 View  Result Date: 06/15/2019 CLINICAL DATA:  New PICC line placement EXAM: PORTABLE CHEST 1 VIEW COMPARISON:  06/13/2019 FINDINGS: Right arm PICC line tip is at the cavoatrial junction. There is a left chest wall pacer device with leads in the right atrial appendage and right ventricle. Stable cardiac  bilateral pleural effusions and interstitial edema appears similar to previous exam. IMPRESSION: 1. The right arm PICC line tip is at the cavoatrial junction. 2. Stable cardiac enlargement, interstitial edema and pleural effusions. Electronically Signed   By: Colton Lamb M.D.   On: 06/15/2019 10:14   Dg Chest Port 1 View   Result Date: 06/13/2019 CLINICAL DATA:  CHF. EXAM: PORTABLE CHEST 1 VIEW COMPARISON:  06/12/2019. FINDINGS: Cardiac pacer noted stable position. Cardiomegaly. Bibasilar atelectasis. Diffuse bilateral pulmonary infiltrates/edema and bilateral pleural effusions. Degenerative change thoracic spine. IMPRESSION: Cardiac pacer in stable position. Cardiomegaly. Diffuse bilateral pulmonary infiltrates/edema and bilateral pleural effusions. Bibasilar atelectasis. Similar findings noted on prior exam. Electronically Signed   By: Colton Lamb   On: 06/13/2019 09:12   Dg Chest Portable 1 View  Result Date: 06/12/2019 CLINICAL DATA:  Respiratory distress EXAM: PORTABLE CHEST 1 VIEW COMPARISON:  05/18/2019 FINDINGS: Cardiac shadow remains enlarged. Defibrillator is again noted. Mild vascular congestion is again seen with likely posteriorly layering effusions. Mild bibasilar atelectatic changes are noted increased from the prior study. This may be related to a poor inspiratory effort. No bony abnormality is seen. IMPRESSION: Vascular congestion and small pleural effusions. Bibasilar atelectatic changes new from the prior exam. Electronically Signed   By: Colton Lamb M.D.   On: 06/12/2019 01:18   Dg Chest Portable 1 View  Result Date: 05/18/2019 CLINICAL DATA:  69 year old male with shortness of breath and altered mental status. EXAM: PORTABLE CHEST 1 VIEW COMPARISON:  Chest radiograph dated 04/30/2019 FINDINGS: There is moderate cardiomegaly with vascular congestion. Small bilateral pleural effusions and bibasilar atelectasis. Pneumonia is not excluded. Clinical correlation is recommended. Overall similar appearance of the cardiomediastinal silhouette and vascular congestion and edema to the radiograph of 04/30/2019. Right upper lobe opacity, similar to prior radiograph and likely combination of pleural effusion and associated atelectasis. There is no pneumothorax. Atherosclerotic calcification of the aortic arch.  Left pectoral AICD device. No acute osseous pathology. IMPRESSION: Cardiomegaly with vascular congestion and bilateral pleural effusions overall similar to the radiograph of 04/30/2019. Electronically Signed   By: Colton Lamb M.D.   On: 05/18/2019 15:58   [place] Ekg Site Rite  Result Date: 06/14/2019 If Site Rite image not attached, placement could not be confirmed due to current cardiac rhythm.    Antimicrobials:   None   Subjective: Remains in ICU significant edema Addition of vasopressors to facilitate diuresis blood pressure soft and is in 80s  Objective: Vitals:   06/17/19 0800 06/17/19 0900 06/17/19 1000 06/17/19 1200  BP: 97/64 97/66 105/77 94/60  Pulse: 93 94 94 95  Resp: (!) 23 (!) 24 19 (!) 29  Temp: 98 F (36.7 C)   98.9 F (37.2 C)  TempSrc: Oral   Oral  SpO2: 99% 98% 98% 98%  Weight:      Height:        Intake/Output Summary (Last 24 hours) at 06/17/2019 1330 Last data filed at 06/17/2019 1200 Gross per 24 hour  Intake 321.25 ml  Output 3300 ml  Net -2978.75 ml   Filed Weights   06/15/19 0424 06/16/19 0500 06/17/19 0500  Weight: 119.8 kg 117.7 kg 117.4 kg    Examination:  General exam:Appears calm and comfortable  Respiratory system:Difficult exam secondary to body habitus but rales noted, rhonchi noted  Cardiovascular system:S1 &S2 heard, RRR. No JVD, murmurs, rubs, gallops or clicks. No pedal edema. Gastrointestinal system:Abdomen is nondistended, soft and nontender. No organomegaly or masses felt. Normal bowel sounds heard. Central  nervous system:Alert and oriented. No focal neurological deficits. Extremities:2-3+ pitting edema bilaterally up to upper thigh this is improving, warm and well-perfused. Skin: No rashes, lesions or ulcers Psychiatry:Judgement and insight appear normal. Mood &affect appropriate.     Data Reviewed: I have personally reviewed following labs and imaging studies  CBC: Recent Labs  Lab 06/13/19 0619  06/14/19 0355 06/15/19 0548 06/16/19 0531 06/17/19 0558  WBC 11.1* 11.0* 11.4* 12.1* 12.7*  NEUTROABS 9.1* 8.9* 9.1* 9.9* 10.6*  HGB 8.7* 8.4* 7.9* 7.8* 7.7*  HCT 29.3* 28.8* 27.3* 27.2* 25.1*  MCV 84.0 84.2 86.1 85.3 81.0  PLT 340 347 298 308 161   Basic Metabolic Panel: Recent Labs  Lab 06/13/19 0619 06/14/19 0355 06/15/19 0548 06/16/19 0531 06/17/19 0558  NA 140 144 142 141 139  K 3.3* 3.8 4.2 3.7 3.5  CL 90* 93* 92* 90* 88*  CO2 39* 41* 39* 41* 39*  GLUCOSE 117* 113* 125* 120* 135*  BUN 23 23 23  25* 26*  CREATININE 0.92 0.95 1.00 1.00 0.98  CALCIUM 8.2* 8.3* 8.5* 8.6* 8.9  MG 1.8 2.2  --   --  2.0   GFR: Estimated Creatinine Clearance: 95.4 mL/min (by C-G formula based on SCr of 0.98 mg/dL). Liver Function Tests: Recent Labs  Lab 06/12/19 0100  AST 27  ALT 15  ALKPHOS 160*  BILITOT 0.8  PROT 6.6  ALBUMIN 2.9*   No results for input(s): LIPASE, AMYLASE in the last 168 hours. No results for input(s): AMMONIA in the last 168 hours. Coagulation Profile: No results for input(s): INR, PROTIME in the last 168 hours. Cardiac Enzymes: No results for input(s): CKTOTAL, CKMB, CKMBINDEX, TROPONINI in the last 168 hours. BNP (last 3 results) No results for input(s): PROBNP in the last 8760 hours. HbA1C: No results for input(s): HGBA1C in the last 72 hours. CBG: Recent Labs  Lab 06/16/19 1222 06/16/19 1614 06/16/19 2202 06/16/19 2209 06/17/19 0732  GLUCAP 173* 141* 164* 156* 119*   Lipid Profile: No results for input(s): CHOL, HDL, LDLCALC, TRIG, CHOLHDL, LDLDIRECT in the last 72 hours. Thyroid Function Tests: No results for input(s): TSH, T4TOTAL, FREET4, T3FREE, THYROIDAB in the last 72 hours. Anemia Panel: No results for input(s): VITAMINB12, FOLATE, FERRITIN, TIBC, IRON, RETICCTPCT in the last 72 hours. Sepsis Labs: No results for input(s): PROCALCITON, LATICACIDVEN in the last 168 hours.  Recent Results (from the past 240 hour(s))  SARS CORONAVIRUS  2 (TAT 6-24 HRS) Nasopharyngeal Nasopharyngeal Swab     Status: None   Collection Time: 06/12/19  1:00 AM   Specimen: Nasopharyngeal Swab  Result Value Ref Range Status   SARS Coronavirus 2 NEGATIVE NEGATIVE Final    Comment: (NOTE) SARS-CoV-2 target nucleic acids are NOT DETECTED. The SARS-CoV-2 RNA is generally detectable in upper and lower respiratory specimens during the acute phase of infection. Negative results do not preclude SARS-CoV-2 infection, do not rule out co-infections with other pathogens, and should not be used as the sole basis for treatment or other patient management decisions. Negative results must be combined with clinical observations, patient history, and epidemiological information. The expected result is Negative. Fact Sheet for Patients: SugarRoll.be Fact Sheet for Healthcare Providers: https://www.woods-mathews.com/ This test is not yet approved or cleared by the [place] FDA and  has been authorized for detection and/or diagnosis of SARS-CoV-2 by FDA under an Emergency Use Authorization (EUA). This EUA will remain  in effect (meaning this test can be used) for the duration of the COVID-19 declaration under Section  56 4(b)(1) of the Act, 21 U.S.C. section 360bbb-3(b)(1), unless the authorization is terminated or revoked sooner. Performed at [facility], [place] [address] [place], [place] [address]   MRSA PCR Screening     Status: None   Collection Time: 06/12/19 11:00 PM   Specimen: Nasopharyngeal  Result Value Ref Range Status   MRSA by PCR NEGATIVE NEGATIVE Final    Comment:        The GeneXpert MRSA Assay (FDA approved for NASAL specimens only), is one component of a comprehensive MRSA colonization surveillance program. It is not intended to diagnose MRSA infection nor to guide or monitor treatment for MRSA infections. Performed at [facility], [address], [place], [place]  [address]          Radiology Studies: No results found.      Scheduled Meds: . aspirin EC  81 mg Oral Daily  . atorvastatin  80 mg Oral Daily  . balsalazide  750 mg Oral TID  . Chlorhexidine Gluconate Cloth  6 each Topical Q0600  . feeding supplement (NEPRO CARB STEADY)  237 mL Oral BID BM  . finasteride  5 mg Oral Daily  . furosemide  60 mg Intravenous Q12H  . insulin aspart  0-9 Units Subcutaneous TID WC  . insulin detemir  9 Units Subcutaneous Daily  . lactobacillus  1 g Oral TID WC  . midodrine  10 mg Oral TID  . multivitamin-lutein   Oral Daily  . niacin  1,000 mg Oral Daily  . pantoprazole  40 mg Oral Daily  . potassium chloride  10 mEq Oral Daily  . rivaroxaban  20 mg Oral Daily  . sodium chloride flush  10-40 mL Intracatheter Q12H  . sodium chloride flush  3 mL Intravenous Q12H  . tamsulosin  0.4 mg Oral QPC breakfast   Continuous Infusions: . sodium chloride    . cefTAZidime (FORTAZ)  IV Stopped (06/17/19 1118)     LOS: 5 days    Time spent: [place], MD [facility]  If 7PM-7AM, please contact night-coverage  06/17/2019, 1:30 PM

## 2019-06-17 NOTE — Progress Notes (Signed)
Progress Note  Patient Name: Colton Lamb Date of Encounter: 06/17/2019  Primary Cardiologist: Rockey Situ  Subjective   He reports some improvement in shortness of breath and leg edema.  He continues to be in atrial fibrillation with ventricular rate in the low 100.  Renal function remains stable with diuresis.   Inpatient Medications    Scheduled Meds: . aspirin EC  81 mg Oral Daily  . atorvastatin  80 mg Oral Daily  . balsalazide  750 mg Oral TID  . Chlorhexidine Gluconate Cloth  6 each Topical Q0600  . feeding supplement (NEPRO CARB STEADY)  237 mL Oral BID BM  . finasteride  5 mg Oral Daily  . furosemide  60 mg Intravenous Q12H  . insulin aspart  0-9 Units Subcutaneous TID WC  . insulin detemir  9 Units Subcutaneous Daily  . lactobacillus  1 g Oral TID WC  . midodrine  10 mg Oral TID  . multivitamin-lutein   Oral Daily  . niacin  1,000 mg Oral Daily  . pantoprazole  40 mg Oral Daily  . potassium chloride  10 mEq Oral Daily  . rivaroxaban  20 mg Oral Daily  . sodium chloride flush  10-40 mL Intracatheter Q12H  . sodium chloride flush  3 mL Intravenous Q12H  . tamsulosin  0.4 mg Oral QPC breakfast   Continuous Infusions: . sodium chloride    . cefTAZidime (FORTAZ)  IV Stopped (06/17/19 1118)   PRN Meds: sodium chloride, acetaminophen, albuterol, ALPRAZolam, guaiFENesin-dextromethorphan, ondansetron (ZOFRAN) IV, sodium chloride flush, sodium chloride flush, zolpidem   Vital Signs    Vitals:   06/17/19 0800 06/17/19 0900 06/17/19 1000 06/17/19 1200  BP: 97/64 97/66 105/77 94/60  Pulse: 93 94 94 95  Resp: (!) 23 (!) 24 19 (!) 29  Temp: 98 F (36.7 C)   98.9 F (37.2 C)  TempSrc: Oral   Oral  SpO2: 99% 98% 98% 98%  Weight:      Height:        Intake/Output Summary (Last 24 hours) at 06/17/2019 1328 Last data filed at 06/17/2019 1200 Gross per 24 hour  Intake 321.25 ml  Output 3300 ml  Net -2978.75 ml   Filed Weights   06/15/19 0424 06/16/19 0500  06/17/19 0500  Weight: 119.8 kg 117.7 kg 117.4 kg    Telemetry    Afib, 80s to low 100s bpm - Personally Reviewed  ECG    No new tracings - Personally Reviewed  Physical Exam   GEN: No acute distress.   Neck: JVD difficult to assess secondary to body habitus. Cardiac: Irregularly irregular, no murmurs, rubs, or gallops.  Respiratory: Clear to auscultation bilaterally.  GI: Soft, nontender, non-distended.   MS: 1+ bilateral woody edema to the thighs; No deformity. Neuro:  Alert and oriented x 3; Nonfocal.  Psych: Normal affect.  Labs    Chemistry Recent Labs  Lab 06/12/19 0100  06/15/19 0548 06/16/19 0531 06/17/19 0558  NA 142   < > 142 141 139  K 4.0   < > 4.2 3.7 3.5  CL 89*   < > 92* 90* 88*  CO2 42*   < > 39* 41* 39*  GLUCOSE 127*   < > 125* 120* 135*  BUN 24*   < > 23 25* 26*  CREATININE 0.98   < > 1.00 1.00 0.98  CALCIUM 8.6*   < > 8.5* 8.6* 8.9  PROT 6.6  --   --   --   --  ALBUMIN 2.9*  --   --   --   --   AST 27  --   --   --   --   ALT 15  --   --   --   --   ALKPHOS 160*  --   --   --   --   BILITOT 0.8  --   --   --   --   GFRNONAA >60   < > >60 >60 >60  GFRAA >60   < > >60 >60 >60  ANIONGAP 11   < > 11 10 12    < > = values in this interval not displayed.     Hematology Recent Labs  Lab 06/15/19 0548 06/16/19 0531 06/17/19 0558  WBC 11.4* 12.1* 12.7*  RBC 3.17* 3.19* 3.10*  HGB 7.9* 7.8* 7.7*  HCT 27.3* 27.2* 25.1*  MCV 86.1 85.3 81.0  MCH 24.9* 24.5* 24.8*  MCHC 28.9* 28.7* 30.7  RDW 17.1* 17.0* 17.0*  PLT 298 308 267    Cardiac EnzymesNo results for input(s): TROPONINI in the last 168 hours. No results for input(s): TROPIPOC in the last 168 hours.   BNP Recent Labs  Lab 06/12/19 0100  BNP 731.0*     DDimer No results for input(s): DDIMER in the last 168 hours.   Radiology    Dg Chest Port 1 View  Result Date: 06/15/2019 IMPRESSION: 1. The right arm PICC line tip is at the cavoatrial junction. 2. Stable cardiac  enlargement, interstitial edema and pleural effusions. Electronically Signed   By: Kerby Moors M.D.   On: 06/15/2019 10:14   Cardiac Studies   2D Echo 01/2019: 1. The left ventricle has moderately reduced systolic function, with an ejection fraction of 35-40%. The cavity size was normal. Left ventricular diastolic Doppler parameters are indeterminate. Probably severe hypokinesis of mid-distal anterior,  ateroseptal and apical myocardium. 2. The right ventricle has normal systolic function. The cavity was normal. There is no increase in right ventricular wall thickness. 3. Left atrial size was moderately dilated. 4. Very poor image quality in spite of using Definity. Valves not well visualized. Consider different diagnostic testing if indicated. 5. The aortic valve was not well visualized. Aortic valve regurgitation was not assessed by color flow Doppler.  Patient Profile     68 y.o. 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  Assessment & Plan    1. Acute on chronic combined systolic and diastolic CHF: -Volume overload seems to be gradually improving with diuresis.  Continue current dose of intravenous furosemide.  He did receive 2 doses of metolazone over the weekend and that can be considered if urine output slows down.  He is overall -8.4 L for the admission.   -Continue TED hose/SCDs -BB/ACEi/ARB/Entresto/MRA on hold secondary to hypotension  -Escalate evidence based therapy as able -Daily standing weights -Strict I/O  2. Persistent Afib: -He remains in Afib with  controlled ventricular response -Beta blocker on hold in the setting of hypotension as above -Xarelto -Has been in Afib for ~ past 12 months -Pursue  rate control strategy  -Estimated Creatinine Clearance: 95.4 mL/min (by C-G formula based on SCr of 0.98 mg/dL).  Consider starting small dose metoprolol tomorrow.  3. CAD involving the native coronary arteries without angina/elevated HS-Tn: -Initial HS-Tn of 34, not cycled -No chest pain -ASA -Lipitor  4. OSA: -CPAP  5. Anemia: -Low, though stable at 7.8 -Recommend HGB > 8.5 -Likely contributing to symptoms     Kathlyn [place], MD   06/17/2019 1:28 PM

## 2019-06-17 NOTE — Progress Notes (Signed)
Ch visited with pt during rounds. Pt was a/o in bed upon ch arrival. Ch provided social support with pt that that is a 68 y.o. with CHF admitted for the fluid rentention. Ch spoke briefly w/ pt family member that shared pt has not recovered from the hospitalization back in August (not mobile) even though he was admitted to a rehab facility to receive PT/OT so that he could walk again. Ch is concerned pt maybe experiencing FTT considering his existential circumstances and health decline over the last few months. Ch provided space for the family member to express her concerns as well at bedside with pt. She and the pt shared how hard it has been since she cannot visit him while he is in the SNF/rehab facility.  F/u support encouraged.    06/17/19 1000  Clinical Encounter Type  Visited With Patient  Visit Type Social support  Stress Factors  Patient Stress Factors Health changes;Major life changes

## 2019-06-17 NOTE — Consult Note (Signed)
[facility] for Electrolyte Monitoring and Replacement   Colton Lamb is a 76 YOM with a hx of CHF, CAD, HTN, dyslipidemia, and Crohn's Disease who presented to the ED with acute onset of worsening dyspnea associated with dry cough and wheezing. He was placed on BiPAP and admitted to stepdown for further evaluation.  Pharmacy has been consulted for electrolyte monitoring and replacement.  Patient is receiving 60 mg IV Lasix q12h. He is on a carb modified diet and also getting NEPRO CARB STEADY 252m BID between meals. He is also receiving potassium chloride 156m PO daily inpatient (PTA med).  Recent Labs: Potassium (mmol/L)  Date Value  06/17/2019 3.5  11/22/2013 3.7   Magnesium (mg/dL)  Date Value  06/17/2019 2.0  11/22/2013 1.8   Calcium (mg/dL)  Date Value  06/17/2019 8.9   Calcium, Total (mg/dL)  Date Value  11/22/2013 9.0   Albumin (g/dL)  Date Value  06/12/2019 2.9 (L)  04/18/2018 4.3   Phosphorus (mg/dL)  Date Value  04/21/2019 4.8 (H)   Sodium (mmol/L)  Date Value  06/17/2019 139  05/09/2016 141  11/22/2013 133 (L)   Corrected Calcium: 9.9 mg/dL  Assessment/Plan: - K 3.5 - trending down, on lasix. Will order an extra KCl 40 mEq PO x 1. - Will change daily KCl from 10 mEq to 40 mEq PO while on BID Lasix.  - No other electrolytes warranting replacement at this time. - Will check BMP with AM labs and adjust as necessary.  Goals of Therapy:  - K ~ 4 - Mg ~ 2 - Other electrolytes WNL   Thank you for allowing pharmacy to be a part of this patient's care.   CoRaiford SimmondsPharmD Candidate 06/17/2019 2:21 PM

## 2019-06-17 NOTE — Progress Notes (Signed)
Physical Therapy Treatment Patient Details Name: Colton Lamb MRN: [id number] DOB: 03-Dec-1950 Today's Date: 06/17/2019    History of Present Illness [place] presented to ER secondary to respiratory distress; admitted for management of acute hypoxic/hypercarbic respiratory failure (requiring BiPAP at initial presentation).  Of note, patient with large, chronic stage IV sacral decubitus ulcer.    PT Comments    Pt asleep upon entry slumped to Right in bed, but awakens easily and is interactive. MaxA to readjust trunk, but author not able to return pt trunk to midline. Pt lethargic but remains mostly awake for bed level exercises, noted weakness in instances where manual resisted and offered, particularly in RLE which pt finds curious. Toward end pt begins to have more difficulty remaining awake, hence moving to EOB is deferred until later date. BP assessed in supine and sitting, noted ot be essentially flat. Attempted to ween pt to room air, but returned to 1L/min after SpO2 dropping to 87% (pt received on 2L). Pt general does well participating, no pain limitations, but remains very weak.      Follow Up Recommendations  SNF     Equipment Recommendations  None recommended by PT    Recommendations for Other Services       Precautions / Restrictions Precautions Precautions: Fall Restrictions Weight Bearing Restrictions: No    Mobility  Bed Mobility Overal bed mobility: Needs Assistance Bed Mobility: Rolling Rolling: Max assist         General bed mobility comments: straightening trunk in bed very difficulty, pt slumped to the right. OOB not attempted d/t somnolence.  Transfers Overall transfer level: (deferred 2/2 weakness and lethragy)                  Ambulation/Gait                 Stairs             Wheelchair Mobility    Modified Rankin (Stroke Patients Only)       Balance                                            Cognition Arousal/Alertness: Lethargic Behavior During Therapy: WFL for tasks assessed/performed Overall Cognitive Status: No family/caregiver present to determine baseline cognitive functioning                                 General Comments: following commands well with verbal and tactile cues.      Exercises    -Gravity resisted chest press (incline) 1x10 bilat -Unilat UE row 1x10, manually resisted by author -Ankle pumps/DF 1x20 bilat -Manually resisted Ankle PF 1x15 bilat (rt leg a bit weaker)  -SAQ 1x15 bilat -Hip ABDCT/ADD x15 bilat -Heel slides 1x10 bilat -Manually reesisted extension heel slide 1x10 b ilat    General Comments        Pertinent Vitals/Pain Pain Assessment: No/denies pain    Home Living                      Prior Function            PT Goals (current goals can now be found in the care plan section) Acute Rehab PT Goals PT Goal Formulation: Patient unable to participate in goal setting Time For Goal Achievement:  06/27/19 Potential to Achieve Goals: Fair    Frequency    Min 2X/week      PT Plan Current plan remains appropriate    Co-evaluation              AM-PAC PT "6 Clicks" Mobility   Outcome Measure  Help needed turning from your back to your side while in a flat bed without using bedrails?: A Lot Help needed moving from lying on your back to sitting on the side of a flat bed without using bedrails?: A Lot Help needed moving to and from a bed to a chair (including a wheelchair)?: Total Help needed standing up from a chair using your arms (e.g., wheelchair or bedside chair)?: Total Help needed to walk in hospital room?: Total Help needed climbing 3-5 steps with a railing? : Total 6 Click Score: 8    End of Session Equipment Utilized During Treatment: Oxygen Activity Tolerance: Patient tolerated treatment well;Patient limited by fatigue;Patient limited by lethargy Patient left: in bed;with  call bell/phone within reach;with nursing/sitter in room Nurse Communication: Mobility status PT Visit Diagnosis: Muscle weakness (generalized) (M62.81);Difficulty in walking, not elsewhere classified (R26.2)     Time: 1510-1536 PT Time Calculation (min) (ACUTE ONLY): 26 min  Charges:  $Therapeutic Exercise: 23-37 mins                     4:46 PM, 06/17/19 Etta Grandchild, PT, DPT Physical Therapist - [facility]  [phone] ([place])    Annalyce Lanpher C 06/17/2019, 4:41 PM

## 2019-06-18 DIAGNOSIS — D649 Anemia, unspecified: Secondary | ICD-10-CM | POA: Diagnosis present

## 2019-06-18 DIAGNOSIS — I5043 Acute on chronic combined systolic (congestive) and diastolic (congestive) heart failure: Secondary | ICD-10-CM

## 2019-06-18 DIAGNOSIS — I2510 Atherosclerotic heart disease of native coronary artery without angina pectoris: Secondary | ICD-10-CM | POA: Diagnosis not present

## 2019-06-18 LAB — CBC WITH DIFFERENTIAL/PLATELET
Abs Immature Granulocytes: 0.07 10*3/uL (ref 0.00–0.07)
Basophils Absolute: 0.1 10*3/uL (ref 0.0–0.1)
Basophils Relative: 1 %
Eosinophils Absolute: 0.4 10*3/uL (ref 0.0–0.5)
Eosinophils Relative: 3 %
HCT: 26.4 % — ABNORMAL LOW (ref 39.0–52.0)
Hemoglobin: 8 g/dL — ABNORMAL LOW (ref 13.0–17.0)
Immature Granulocytes: 1 %
Lymphocytes Relative: 8 %
Lymphs Abs: 1 10*3/uL (ref 0.7–4.0)
MCH: 24.5 pg — ABNORMAL LOW (ref 26.0–34.0)
MCHC: 30.3 g/dL (ref 30.0–36.0)
MCV: 80.7 fL (ref 80.0–100.0)
Monocytes Absolute: 0.9 10*3/uL (ref 0.1–1.0)
Monocytes Relative: 7 %
Neutro Abs: 10 10*3/uL — ABNORMAL HIGH (ref 1.7–7.7)
Neutrophils Relative %: 80 %
Platelets: 307 10*3/uL (ref 150–400)
RBC: 3.27 MIL/uL — ABNORMAL LOW (ref 4.22–5.81)
RDW: 16.9 % — ABNORMAL HIGH (ref 11.5–15.5)
WBC: 12.5 10*3/uL — ABNORMAL HIGH (ref 4.0–10.5)
nRBC: 0 % (ref 0.0–0.2)

## 2019-06-18 LAB — GLUCOSE, CAPILLARY
Glucose-Capillary: 102 mg/dL — ABNORMAL HIGH (ref 70–99)
Glucose-Capillary: 118 mg/dL — ABNORMAL HIGH (ref 70–99)
Glucose-Capillary: 161 mg/dL — ABNORMAL HIGH (ref 70–99)
Glucose-Capillary: 104 mg/dL — ABNORMAL HIGH (ref 70–99)

## 2019-06-18 LAB — BASIC METABOLIC PANEL
Anion gap: 11 (ref 5–15)
BUN: 26 mg/dL — ABNORMAL HIGH (ref 8–23)
CO2: 38 mmol/L — ABNORMAL HIGH (ref 22–32)
Calcium: 8.8 mg/dL — ABNORMAL LOW (ref 8.9–10.3)
Chloride: 88 mmol/L — ABNORMAL LOW (ref 98–111)
Creatinine, Ser: 1.03 mg/dL (ref 0.61–1.24)
GFR calc Af Amer: >60 mL/min (ref >60)
GFR calc non Af Amer: >60 mL/min (ref >60)
Glucose, Bld: 109 mg/dL — ABNORMAL HIGH (ref 70–99)
Potassium: 3.6 mmol/L (ref 3.5–5.1)
Sodium: 137 mmol/L (ref 135–145)

## 2019-06-18 LAB — MAGNESIUM: Magnesium: 1.9 mg/dL (ref 1.7–2.4)

## 2019-06-18 MED ORDER — FUROSEMIDE 10 MG/ML IJ SOLN
80.0000 mg | Freq: Two times a day (BID) | INTRAMUSCULAR | Status: AC
  Administered 2019-06-18: 80 mg via INTRAVENOUS
  Filled 2019-06-18 (×3): qty 8

## 2019-06-18 NOTE — Progress Notes (Signed)
PROGRESS NOTE    Colton Lamb  HLK:[id number] DOB: 1950/12/11 DOA: 06/12/2019 PCP: [name], MD   Brief Narrative:  [name] a68 y.o.obese Caucasian malewith a known history of systolic CHF, coronary artery disease hypertension dyslipidemia and Crohn's disease, presented to the emergency room with acute onset of worsening dyspnea with associated dry cough and wheezing for the last 24 hours without fever or chills. He he admitted to orthopnea and paroxysmal nocturnal dyspnea as well as dyspnea on exertion with lower extremity edema. No fever or chills. No nausea or vomiting. No dysuria, oliguria or hematuria or flank pain. No recent exposure to COVID-19. He was tested negative about a week ago   Assessment & Plan:   Active Problems:   Coronary artery disease involving native coronary artery of native heart without angina pectoris   Acute respiratory failure ([place])   1. Acute on chronic systolic CHF with subsequent acute hypoxic and likely hypercarbic respiratory failure requiring BiPAP. - weaned offbipap - c/w diuresis, limited by hypotension -Receivedalbumin 25g q6 x 4 doses11/12, repeat 11/15 25g x 4 - lasix 60 iv bid - neg 10.3 L - cards added metolazone11/14withgood response, repeat 11/15 - pressors if inadequate response but appears to be diuresing - PICC placed 11/14 - appreciate cards consult, they continue to follow - strict I/o's  2. Type 2 diabetes mellitus. - c/wsliding scale insulin,  -checking blood glucose AC at bedtime, -diabetic diet  3. Dyslipidemia. -Lipitor 80 mg daily  4. Crohn's disease. -balsalazide.  5. BPH. -continue Proscar and Flomax.  6. Stage 4 sacrum decubiti POA; -wound care consult appreciated, continue with Aquacel, Kerlix, ABD pad and change daily, agree with repositioning every 2 hours  DVT prophylaxis: TD:SKAJGOT   Code Status: Full code    Code Status Orders   (From admission, onward)         Start     Ordered   06/12/19 0351  Full code  Continuous     06/12/19 0359        Code Status History    Date Active Date Inactive Code Status Order ID Comments User Context   04/21/2019 1857 04/26/2019 2045 Full Code [id number]  Demetrios Loll, MD Inpatient   03/04/2019 2021 03/19/2019 2042 Full Code [id number]  [name], MD Inpatient   09/07/2016 0100 09/09/2016 2001 Full Code [id number]  Harvie Bridge, DO Inpatient   04/16/2014 1552 04/17/2014 1512 Full Code [id number]  Evans Lance, MD Inpatient   12/11/2013 2042 12/14/2013 1516 Full Code [id number]  Eileen Stanford, PA-C Inpatient   11/22/2013 1434 11/30/2013 2000 Full Code [id number]  Belva Crome, MD Inpatient   11/22/2013 0916 11/22/2013 1434 Full Code [id number]  Dorothy Spark, MD Inpatient   11/22/2013 0829 11/22/2013 0916 Full Code [id number]  Barrett, Evelene Croon, PA-C Inpatient   Advance Care Planning Activity     Family Communication: Spoke with son updated on plan Disposition Plan:   Patient will remain inpatient for continued IV diuresis, still with significant volume overload with soft blood pressures.  Will require continued diuresis as above, continued expert subspecialty consultation.  Patient not yet ready for discharge  Consults called: None Admission status: Inpatient   Consultants:   Cardiology  Procedures:  Dg Chest Port 1 View  Result Date: 06/15/2019 CLINICAL DATA:  New PICC line placement EXAM: PORTABLE CHEST 1 VIEW COMPARISON:  06/13/2019 FINDINGS: Right arm PICC line tip is at the cavoatrial junction. There is a left chest wall pacer device with leads in  the right atrial appendage and right ventricle. Stable cardiac bilateral pleural effusions and interstitial edema appears similar to previous exam. IMPRESSION: 1. The right arm PICC line tip is at the cavoatrial junction. 2. Stable cardiac enlargement, interstitial edema and pleural effusions. Electronically Signed   By: Kerby Moors M.D.   On: 06/15/2019 10:14   Dg Chest Port 1 View  Result Date: 06/13/2019 CLINICAL DATA:  CHF. EXAM: PORTABLE CHEST 1 VIEW COMPARISON:  06/12/2019. FINDINGS: Cardiac pacer noted stable position. Cardiomegaly. Bibasilar atelectasis. Diffuse bilateral pulmonary infiltrates/edema and bilateral pleural effusions. Degenerative change thoracic spine. IMPRESSION: Cardiac pacer in stable position. Cardiomegaly. Diffuse bilateral pulmonary infiltrates/edema and bilateral pleural effusions. Bibasilar atelectasis. Similar findings noted on prior exam. Electronically Signed   By: [place]   On: 06/13/2019 09:12   Dg Chest Portable 1 View  Result Date: 06/12/2019 CLINICAL DATA:  Respiratory distress EXAM: PORTABLE CHEST 1 VIEW COMPARISON:  05/18/2019 FINDINGS: Cardiac shadow remains enlarged. Defibrillator is again noted. Mild vascular congestion is again seen with likely posteriorly layering effusions. Mild bibasilar atelectatic changes are noted increased from the prior study. This may be related to a poor inspiratory effort. No bony abnormality is seen. IMPRESSION: Vascular congestion and small pleural effusions. Bibasilar atelectatic changes new from the prior exam. Electronically Signed   By: Inez Catalina M.D.   On: 06/12/2019 01:18   [place] Ekg Site Rite  Result Date: 06/14/2019 If Site Rite image not attached, placement could not be confirmed due to current cardiac rhythm.    Antimicrobials:   None   Subjective: Patient with significant edema with improvement Transferred out of the ICU Not needing vasopressors for diuresis  Objective: Vitals:   06/17/19 2115 06/18/19 0417 06/18/19 0437 06/18/19 1053  BP: 95/61 99/72  102/66  Pulse: 94 (!) 104  89  Resp: 20 20  20   Temp: 98.3 F (36.8 C) 99.6 F (37.6 C)  (!) 97.4 F (36.3 C)  TempSrc: Oral   Oral  SpO2: 98% 98%  100%  Weight:   120.6 kg   Height:        Intake/Output Summary (Last 24 hours) at 06/18/2019 1308 Last  data filed at 06/18/2019 1000 Gross per 24 hour  Intake 203.58 ml  Output 3050 ml  Net -2846.42 ml   Filed Weights   06/16/19 0500 06/17/19 0500 06/18/19 0437  Weight: 117.7 kg 117.4 kg 120.6 kg    Examination:  General exam:Appears calm and comfortable  Respiratory system:Difficult exam secondary to body habitus but rales noted, rhonchi noted  Cardiovascular system:S1 &S2 heard, RRR. No JVD, murmurs, rubs, gallops or clicks. No pedal edema. Gastrointestinal system:Abdomen is nondistended, soft and nontender. No organomegaly or masses felt. Normal bowel sounds heard. Central nervous system:Alert and oriented. No focal neurological deficits. Extremities:2-3+ pitting edema bilaterally up to upper thigh this CONTINUES improving, warm and well-perfused. Skin: No rashes, lesions or ulcers Psychiatry:Judgement and insight appear normal. Mood &affect appropriate..     Data Reviewed: I have personally reviewed following labs and imaging studies  CBC: Recent Labs  Lab 06/14/19 0355 06/15/19 0548 06/16/19 0531 06/17/19 0558 06/18/19 0532  WBC 11.0* 11.4* 12.1* 12.7* 12.5*  NEUTROABS 8.9* 9.1* 9.9* 10.6* 10.0*  HGB 8.4* 7.9* 7.8* 7.7* 8.0*  HCT 28.8* 27.3* 27.2* 25.1* 26.4*  MCV 84.2 86.1 85.3 81.0 80.7  PLT 347 298 308 267 409   Basic Metabolic Panel: Recent Labs  Lab 06/13/19 0619 06/14/19 0355 06/15/19 0548 06/16/19 0531 06/17/19 0558 06/18/19 0532  NA 140 144 142 141 139 137  K 3.3* 3.8 4.2 3.7 3.5 3.6  CL 90* 93* 92* 90* 88* 88*  CO2 39* 41* 39* 41* 39* 38*  GLUCOSE 117* 113* 125* 120* 135* 109*  BUN 23 23 23  25* 26* 26*  CREATININE 0.92 0.95 1.00 1.00 0.98 1.03  CALCIUM 8.2* 8.3* 8.5* 8.6* 8.9 8.8*  MG 1.8 2.2  --   --  2.0 1.9   GFR: Estimated Creatinine Clearance: 92 mL/min (by C-G formula based on SCr of 1.03 mg/dL). Liver Function Tests: Recent Labs  Lab 06/12/19 0100  AST 27  ALT 15  ALKPHOS 160*  BILITOT 0.8  PROT 6.6  ALBUMIN 2.9*    No results for input(s): LIPASE, AMYLASE in the last 168 hours. No results for input(s): AMMONIA in the last 168 hours. Coagulation Profile: No results for input(s): INR, PROTIME in the last 168 hours. Cardiac Enzymes: No results for input(s): CKTOTAL, CKMB, CKMBINDEX, TROPONINI in the last 168 hours. BNP (last 3 results) No results for input(s): PROBNP in the last 8760 hours. HbA1C: No results for input(s): HGBA1C in the last 72 hours. CBG: Recent Labs  Lab 06/17/19 1130 06/17/19 1544 06/17/19 2128 06/18/19 0814 06/18/19 1203  GLUCAP 131* 113* 116* 102* 118*   Lipid Profile: No results for input(s): CHOL, HDL, LDLCALC, TRIG, CHOLHDL, LDLDIRECT in the last 72 hours. Thyroid Function Tests: No results for input(s): TSH, T4TOTAL, FREET4, T3FREE, THYROIDAB in the last 72 hours. Anemia Panel: No results for input(s): VITAMINB12, FOLATE, FERRITIN, TIBC, IRON, RETICCTPCT in the last 72 hours. Sepsis Labs: No results for input(s): PROCALCITON, LATICACIDVEN in the last 168 hours.  Recent Results (from the past 240 hour(s))  SARS CORONAVIRUS 2 (TAT 6-24 HRS) Nasopharyngeal Nasopharyngeal Swab     Status: None   Collection Time: 06/12/19  1:00 AM   Specimen: Nasopharyngeal Swab  Result Value Ref Range Status   SARS Coronavirus 2 NEGATIVE NEGATIVE Final    Comment: (NOTE) SARS-CoV-2 target nucleic acids are NOT DETECTED. The SARS-CoV-2 RNA is generally detectable in upper and lower respiratory specimens during the acute phase of infection. Negative results do not preclude SARS-CoV-2 infection, do not rule out co-infections with other pathogens, and should not be used as the sole basis for treatment or other patient management decisions. Negative results must be combined with clinical observations, patient history, and epidemiological information. The expected result is Negative. Fact Sheet for Patients: SugarRoll.be Fact Sheet for Healthcare  Providers: https://www.woods-mathews.com/ This test is not yet approved or cleared by the [place] FDA and  has been authorized for detection and/or diagnosis of SARS-CoV-2 by FDA under an Emergency Use Authorization (EUA). This EUA will remain  in effect (meaning this test can be used) for the duration of the COVID-19 declaration under Section 56 4(b)(1) of the Act, 21 U.S.C. section 360bbb-3(b)(1), unless the authorization is terminated or revoked sooner. Performed at [facility], [place] [address] [place], [place] [address]   MRSA PCR Screening     Status: None   Collection Time: 06/12/19 11:00 PM   Specimen: Nasopharyngeal  Result Value Ref Range Status   MRSA by PCR NEGATIVE NEGATIVE Final    Comment:        The GeneXpert MRSA Assay (FDA approved for NASAL specimens only), is one component of a comprehensive MRSA colonization surveillance program. It is not intended to diagnose MRSA infection nor to guide or monitor treatment for MRSA infections. Performed at [facility], [phone]  [address] [place], [place] [address]          Radiology Studies: No results found.      Scheduled Meds: . aspirin EC  81 mg Oral Daily  . atorvastatin  80 mg Oral Daily  . balsalazide  750 mg Oral TID  . Chlorhexidine Gluconate Cloth  6 each Topical Q0600  . feeding supplement (NEPRO CARB STEADY)  237 mL Oral BID BM  . finasteride  5 mg Oral Daily  . furosemide  60 mg Intravenous Q12H  . insulin aspart  0-9 Units Subcutaneous TID WC  . insulin detemir  9 Units Subcutaneous Daily  . lactobacillus  1 g Oral TID WC  . midodrine  10 mg Oral TID  . multivitamin-lutein   Oral Daily  . niacin  1,000 mg Oral Daily  . pantoprazole  40 mg Oral Daily  . potassium chloride  40 mEq Oral Daily  . rivaroxaban  20 mg Oral Daily  . sodium chloride flush  10-40 mL Intracatheter Q12H  . sodium chloride flush  3 mL Intravenous Q12H  . tamsulosin  0.4 mg Oral QPC  breakfast   Continuous Infusions: . sodium chloride    . cefTAZidime (FORTAZ)  IV 1 g (06/18/19 7357)     LOS: 6 days    Time spent: [place]    Nicolette Bang, MD [facility]  If 7PM-7AM, please contact night-coverage  06/18/2019, 1:08 PM

## 2019-06-18 NOTE — Consult Note (Signed)
[facility] for Electrolyte Monitoring and Replacement   Colton Lamb is a 56 YOM with a hx of CHF, CAD, HTN, dyslipidemia, and Crohn's Disease who presented to the ED with acute onset of worsening dyspnea associated with dry cough and wheezing. He was placed on BiPAP and admitted to stepdown for further evaluation.  Pharmacy has been consulted for electrolyte monitoring and replacement.  Patient is receiving 60 mg IV Lasix q12h. He is on a carb modified diet and also getting NEPRO CARB STEADY 237m BID between meals. He is also receiving potassium chloride 169m PO daily inpatient (PTA med).  Recent Labs: Potassium (mmol/L)  Date Value  06/18/2019 3.6  11/22/2013 3.7   Magnesium (mg/dL)  Date Value  06/18/2019 1.9  11/22/2013 1.8   Calcium (mg/dL)  Date Value  06/18/2019 8.8 (L)   Calcium, Total (mg/dL)  Date Value  11/22/2013 9.0   Albumin (g/dL)  Date Value  06/12/2019 2.9 (L)  04/18/2018 4.3   Phosphorus (mg/dL)  Date Value  04/21/2019 4.8 (H)   Sodium (mmol/L)  Date Value  06/18/2019 137  05/09/2016 141  11/22/2013 133 (L)   Corrected Calcium: 9.9 mg/dL  Assessment/Plan: - K 3.6 - somewhat stable, on lasix. Will order an extra KCl 40 mEq PO x 1. - Changed daily KCl from 10 mEq to 40 mEq PO while on BID Lasix.  - No other electrolytes warranting replacement at this time. - Will check BMP with AM labs and adjust as necessary.  Goals of Therapy:  - K ~ 4 - Mg ~ 2 - Other electrolytes WNL   Thank you for allowing pharmacy to be a part of this patient's care.   WaPearla [place]onnetPharmD Clinical Pharmacist 06/18/2019 7:37 AM

## 2019-06-18 NOTE — Progress Notes (Signed)
Progress Note  Patient Name: Colton Lamb Date of Encounter: 06/18/2019  Primary Cardiologist: Ida Rogue, MD   Subjective   Feels well; no significant dyspnea.  Edema improving.  No chest pain or palpitations.  No bleeding reported.  Inpatient Medications    Scheduled Meds: . aspirin EC  81 mg Oral Daily  . atorvastatin  80 mg Oral Daily  . balsalazide  750 mg Oral TID  . Chlorhexidine Gluconate Cloth  6 each Topical Q0600  . feeding supplement (NEPRO CARB STEADY)  237 mL Oral BID BM  . finasteride  5 mg Oral Daily  . furosemide  60 mg Intravenous Q12H  . insulin aspart  0-9 Units Subcutaneous TID WC  . insulin detemir  9 Units Subcutaneous Daily  . lactobacillus  1 g Oral TID WC  . midodrine  10 mg Oral TID  . multivitamin-lutein   Oral Daily  . niacin  1,000 mg Oral Daily  . pantoprazole  40 mg Oral Daily  . potassium chloride  40 mEq Oral Daily  . rivaroxaban  20 mg Oral Daily  . sodium chloride flush  10-40 mL Intracatheter Q12H  . sodium chloride flush  3 mL Intravenous Q12H  . tamsulosin  0.4 mg Oral QPC breakfast   Continuous Infusions: . sodium chloride    . cefTAZidime (FORTAZ)  IV 1 g (06/18/19 1400)   PRN Meds: sodium chloride, acetaminophen, albuterol, ALPRAZolam, guaiFENesin-dextromethorphan, ondansetron (ZOFRAN) IV, sodium chloride flush, sodium chloride flush, zolpidem   Vital Signs    Vitals:   06/17/19 2115 06/18/19 0417 06/18/19 0437 06/18/19 1053  BP: 95/61 99/72  102/66  Pulse: 94 (!) 104  89  Resp: 20 20  20   Temp: 98.3 F (36.8 C) 99.6 F (37.6 C)  (!) 97.4 F (36.3 C)  TempSrc: Oral   Oral  SpO2: 98% 98%  100%  Weight:   120.6 kg   Height:        Intake/Output Summary (Last 24 hours) at 06/18/2019 1600 Last data filed at 06/18/2019 1549 Gross per 24 hour  Intake 272 ml  Output 2400 ml  Net -2128 ml   Last 3 Weights 06/18/2019 06/17/2019 06/16/2019  Weight (lbs) 265 lb 14 oz 258 lb 13.1 oz 259 lb 7.7 oz  Weight (kg)  120.6 kg 117.4 kg 117.7 kg      Telemetry    Atrial fibrillation with ventricular rates of 90 to 105 bpm.  Occasional PVCs noted. - Personally Reviewed  ECG    No new tracing.  Physical Exam   GEN:  Pale, chronically ill-appearing man lying in bed. Neck: No JVD, though evaluation is limited by body habitus and patient positioning. Cardiac:  Irregularly irregular without murmurs. Respiratory:  Diminished breath sounds in both lung fields.  No wheezes or crackles. GI:  Obese but soft.  Nontender. MS: 1+ pitting dependent edema in both thighs.  SCDs in place. Neuro:  Nonfocal  Psych: Normal affect   Labs    High Sensitivity Troponin:   Recent Labs  Lab 06/12/19 0100  TROPONINIHS 34*      Chemistry Recent Labs  Lab 06/12/19 0100  06/16/19 0531 06/17/19 0558 06/18/19 0532  NA 142   < > 141 139 137  K 4.0   < > 3.7 3.5 3.6  CL 89*   < > 90* 88* 88*  CO2 42*   < > 41* 39* 38*  GLUCOSE 127*   < > 120* 135* 109*  BUN 24*   < >  25* 26* 26*  CREATININE 0.98   < > 1.00 0.98 1.03  CALCIUM 8.6*   < > 8.6* 8.9 8.8*  PROT 6.6  --   --   --   --   ALBUMIN 2.9*  --   --   --   --   AST 27  --   --   --   --   ALT 15  --   --   --   --   ALKPHOS 160*  --   --   --   --   BILITOT 0.8  --   --   --   --   GFRNONAA >60   < > >60 >60 >60  GFRAA >60   < > >60 >60 >60  ANIONGAP 11   < > 10 12 11    < > = values in this interval not displayed.     Hematology Recent Labs  Lab 06/16/19 0531 06/17/19 0558 06/18/19 0532  WBC 12.1* 12.7* 12.5*  RBC 3.19* 3.10* 3.27*  HGB 7.8* 7.7* 8.0*  HCT 27.2* 25.1* 26.4*  MCV 85.3 81.0 80.7  MCH 24.5* 24.8* 24.5*  MCHC 28.7* 30.7 30.3  RDW 17.0* 17.0* 16.9*  PLT 308 267 307    BNP Recent Labs  Lab 06/12/19 0100  BNP 731.0*     DDimer No results for input(s): DDIMER in the last 168 hours.   Radiology    No results found.  Cardiac Studies   2D Echo 01/2019: 1. The left ventricle has moderately reduced systolic function,  with an ejection fraction of 35-40%. The cavity size was normal. Left ventricular diastolic Doppler parameters are indeterminate. Probably severe hypokinesis of mid-distal anterior,  ateroseptal and apical myocardium. 2. The right ventricle has normal systolic function. The cavity was normal. There is no increase in right ventricular wall thickness. 3. Left atrial size was moderately dilated. 4. Very poor image quality in spite of using Definity. Valves not well visualized. Consider different diagnostic testing if indicated. 5. The aortic valve was not well visualized. Aortic valve regurgitation was not assessed by color flow Doppler.  Patient Profile     68 y.o. male history of CAD s/p remote stenting of the LAD, LCx, PL branch s/p PCI to the OM in 2008, chronic combined CHF secondary to ICM s/p [employer] ICD in 04/2014, PAF on Xarelto, atrial flutter s/p DCCV in 03/2014 s/p ablation in 04/2014, Crohn's ileocolitis, syncope in 11/2013 in the setting of volume depletion and bradycardia due to digoxin toxicity, DM, HTN, HLD, sleep apnea not complaint with CPAP, GERD, and obesitywho we are seeing for volume overload.  Assessment & Plan    Acute on chronic systolic and diastolic heart failure: Patient still appears mildly volume overloaded.  His weight is actually up 7 pounds and back to his admission weight (question accuracy).  His urine output has trailed off over the last 2 days.  BUN and creatinine have remained stable.  Increase furosemide to 80 mg IV twice daily.  Defer adding beta-blocker and ACE inhibitor/ARB in the setting of hypotension.  Add spironolactone 12.5 mg daily.  Persistent atrial fibrillation: Ventricular rate reasonable in spite of discontinuation of beta-blocker.  Continue rivaroxaban, though low threshold for stopping in the setting of anemia.  Coronary artery disease: Minimal elevation of high-sensitivity troponin noted on admission, not cycled.  Patient without  symptoms to suggest ongoing ischemia.  Continue secondary prevention.  Anemia:  Hemoglobin stable at 8.0.  Source of blood loss  unclear.  Suspect chronic disease is driving this.  Recommend PRBC transfusion if hemoglobin drops below 8 or patient has worsening symptoms.  Recommend further work-up by internal medicine service.  For questions or updates, please contact [place] Please consult [email or web address] for contact info under [facility].     Signed, Nelva Bush, MD  06/18/2019, 4:00 PM

## 2019-06-18 NOTE — Care Management Important Message (Signed)
Important Message  Patient Details  Name: Colton Lamb MRN: [id number] Date of Birth: [birth year]   Medicare Important Message Given:  Yes     Juliann Pulse A Glorine Hanratty 06/18/2019, 2:44 PM

## 2019-06-19 ENCOUNTER — Other Ambulatory Visit: Payer: Self-pay

## 2019-06-19 DIAGNOSIS — I959 Hypotension, unspecified: Secondary | ICD-10-CM | POA: Diagnosis present

## 2019-06-19 DIAGNOSIS — L89154 Pressure ulcer of sacral region, stage 4: Secondary | ICD-10-CM | POA: Diagnosis present

## 2019-06-19 DIAGNOSIS — I4891 Unspecified atrial fibrillation: Secondary | ICD-10-CM

## 2019-06-19 DIAGNOSIS — I4819 Other persistent atrial fibrillation: Secondary | ICD-10-CM | POA: Diagnosis not present

## 2019-06-19 DIAGNOSIS — I5043 Acute on chronic combined systolic (congestive) and diastolic (congestive) heart failure: Secondary | ICD-10-CM | POA: Diagnosis not present

## 2019-06-19 DIAGNOSIS — L89150 Pressure ulcer of sacral region, unstageable: Secondary | ICD-10-CM | POA: Diagnosis present

## 2019-06-19 LAB — CBC WITH DIFFERENTIAL/PLATELET
Abs Immature Granulocytes: 0.06 10*3/uL (ref 0.00–0.07)
Basophils Absolute: 0.1 10*3/uL (ref 0.0–0.1)
Basophils Relative: 1 %
Eosinophils Absolute: 0.4 10*3/uL (ref 0.0–0.5)
Eosinophils Relative: 4 %
HCT: 26.4 % — ABNORMAL LOW (ref 39.0–52.0)
Hemoglobin: 7.9 g/dL — ABNORMAL LOW (ref 13.0–17.0)
Immature Granulocytes: 1 %
Lymphocytes Relative: 8 %
Lymphs Abs: 1 10*3/uL (ref 0.7–4.0)
MCH: 24.8 pg — ABNORMAL LOW (ref 26.0–34.0)
MCHC: 29.9 g/dL — ABNORMAL LOW (ref 30.0–36.0)
MCV: 83 fL (ref 80.0–100.0)
Monocytes Absolute: 0.7 10*3/uL (ref 0.1–1.0)
Monocytes Relative: 6 %
Neutro Abs: 9.9 10*3/uL — ABNORMAL HIGH (ref 1.7–7.7)
Neutrophils Relative %: 80 %
Platelets: 319 10*3/uL (ref 150–400)
RBC: 3.18 MIL/uL — ABNORMAL LOW (ref 4.22–5.81)
RDW: 16.7 % — ABNORMAL HIGH (ref 11.5–15.5)
WBC: 12.2 10*3/uL — ABNORMAL HIGH (ref 4.0–10.5)
nRBC: 0 % (ref 0.0–0.2)

## 2019-06-19 LAB — BASIC METABOLIC PANEL
Anion gap: 16 — ABNORMAL HIGH (ref 5–15)
BUN: 26 mg/dL — ABNORMAL HIGH (ref 8–23)
CO2: 40 mmol/L — ABNORMAL HIGH (ref 22–32)
Calcium: 8.9 mg/dL (ref 8.9–10.3)
Chloride: 83 mmol/L — ABNORMAL LOW (ref 98–111)
Creatinine, Ser: 0.89 mg/dL (ref 0.61–1.24)
GFR calc Af Amer: >60 mL/min (ref >60)
GFR calc non Af Amer: >60 mL/min (ref >60)
Glucose, Bld: 107 mg/dL — ABNORMAL HIGH (ref 70–99)
Potassium: 3.4 mmol/L — ABNORMAL LOW (ref 3.5–5.1)
Sodium: 139 mmol/L (ref 135–145)

## 2019-06-19 LAB — GLUCOSE, CAPILLARY
Glucose-Capillary: 97 mg/dL (ref 70–99)
Glucose-Capillary: 148 mg/dL — ABNORMAL HIGH (ref 70–99)
Glucose-Capillary: 163 mg/dL — ABNORMAL HIGH (ref 70–99)
Glucose-Capillary: 164 mg/dL — ABNORMAL HIGH (ref 70–99)

## 2019-06-19 LAB — MAGNESIUM: Magnesium: 2.1 mg/dL (ref 1.7–2.4)

## 2019-06-19 MED ORDER — POTASSIUM CHLORIDE CRYS ER 20 MEQ PO TBCR
40.0000 meq | EXTENDED_RELEASE_TABLET | ORAL | Status: AC
  Administered 2019-06-19 (×2): 40 meq via ORAL
  Filled 2019-06-19 (×2): qty 2

## 2019-06-19 NOTE — Progress Notes (Signed)
Physical Therapy Treatment Patient Details Name: Colton Lamb MRN: [id number] DOB: Jul 13, 1951 Today's Date: 06/19/2019    History of Present Illness Colton Lamb presented to ER secondary to respiratory distress; admitted for management of acute hypoxic/hypercarbic respiratory failure (requiring BiPAP at initial presentation).  Of note, patient with large, chronic stage IV sacral decubitus ulcer.    PT Comments    Pt in bed upon entry, lights out, awake, agreeable to participate. Pt conversational and interactive, no complaints. Initiated bed level exercises, then transition to sitting EOB for trunk strength/stability, as well as seated trunk exercise. Pt requires +2 max-total assist for bed mobility. Pt unable to rise during several balance attempts. Pt returned to supine after noting bowel incontinence, RN made aware of pt's need for assistance with pericare. Slight dizziness upon sitting, but BP actually comes up a little bit in sitting.    Follow Up Recommendations  SNF     Equipment Recommendations  None recommended by PT    Recommendations for Other Services       Precautions / Restrictions Precautions Precautions: Fall Precaution Comments: *sTg4 Sacral wound Restrictions Weight Bearing Restrictions: No    Mobility  Bed Mobility Overal bed mobility: Needs Assistance Bed Mobility: Supine to Sit;Sit to Supine Rolling: +2 for physical assistance;Mod assist   Supine to sit: Max assist;+2 for physical assistance;+2 for safety/equipment     General bed mobility comments: once at EOB, is quickly able to established seated balance, trunk stability  Transfers Overall transfer level: Needs assistance   Transfers: Sit to/from Stand;Lateral/Scoot Transfers Sit to Stand: +2 physical assistance;Max assist;From elevated surface(attempted twice, pt unable to rise from standing)        Lateral/Scoot Transfers: +2 safety/equipment;+2 physical assistance(unable to achieve  despite efforts and teaching, has pain and noted to be sitting in BM after attempt)    Ambulation/Gait                 Stairs             Wheelchair Mobility    Modified Rankin (Stroke Patients Only)       Balance Overall balance assessment: Needs assistance Sitting-balance support: Feet supported;Feet unsupported Sitting balance-Leahy Scale: Good     Standing balance support: Bilateral upper extremity supported;During functional activity Standing balance-Leahy Scale: Zero                              Cognition Arousal/Alertness: Awake/alert Behavior During Therapy: WFL for tasks assessed/performed Overall Cognitive Status: Within Functional Limits for tasks assessed                                 General Comments: following simple commands well with verbal and tactile cues, is conversational      Exercises   -Supipine SAQ 1x15 bilat (ROM improved on RLE compared to 2 days ago.  -Seated LAQ 1x10 bilat, weakness noted on RLE with poor capacity to achieve TKE without momentum and unable to hold TKE isometrically -seated marching 1x10 bilat     General Comments        Pertinent Vitals/Pain Pain Assessment: No/denies pain    Home Living                      Prior Function            PT Goals (current goals can now  be found in the care plan section) Acute Rehab PT Goals PT Goal Formulation: With patient Time For Goal Achievement: 06/27/19 Potential to Achieve Goals: Fair Progress towards PT goals: Progressing toward goals    Frequency    Min 2X/week      PT Plan Current plan remains appropriate    Co-evaluation              AM-PAC PT "6 Clicks" Mobility   Outcome Measure  Help needed turning from your back to your side while in a flat bed without using bedrails?: A Lot Help needed moving from lying on your back to sitting on the side of a flat bed without using bedrails?: A Lot Help needed  moving to and from a bed to a chair (including a wheelchair)?: Total Help needed standing up from a chair using your arms (e.g., wheelchair or bedside chair)?: Total Help needed to walk in hospital room?: Total Help needed climbing 3-5 steps with a railing? : Total 6 Click Score: 8    End of Session Equipment Utilized During Treatment: Oxygen Activity Tolerance: Patient tolerated treatment well;Patient limited by fatigue;Other (comment)(bowel incontinence) Patient left: in bed;with call bell/phone within reach;with nursing/sitter in room;with bed alarm set Nurse Communication: Mobility status PT Visit Diagnosis: Muscle weakness (generalized) (M62.81);Difficulty in walking, not elsewhere classified (R26.2)     Time: 3736-6815 PT Time Calculation (min) (ACUTE ONLY): 25 min  Charges:  $Therapeutic Exercise: 23-37 mins                     4:22 PM, 06/19/19 Etta Grandchild, PT, DPT Physical Therapist - [facility]  [phone] ([name])    Buccola,[name] 06/19/2019, 4:17 PM

## 2019-06-19 NOTE — Progress Notes (Signed)
MD aware that pt BP 91/60 lasix held, acknowledges, agrees, no new orders

## 2019-06-19 NOTE — Progress Notes (Signed)
Progress Note  Patient Name: Colton Lamb Date of Encounter: 06/19/2019  Primary Cardiologist: Ida Rogue, MD   Subjective   Patient's seen taking a nap.  He feels fine, edema and shortness of breath is much improved compared to admission.  Inpatient Medications    Scheduled Meds: . aspirin EC  81 mg Oral Daily  . atorvastatin  80 mg Oral Daily  . balsalazide  750 mg Oral TID  . Chlorhexidine Gluconate Cloth  6 each Topical Q0600  . feeding supplement (NEPRO CARB STEADY)  237 mL Oral BID BM  . finasteride  5 mg Oral Daily  . furosemide  80 mg Intravenous Q12H  . insulin aspart  0-9 Units Subcutaneous TID WC  . insulin detemir  9 Units Subcutaneous Daily  . lactobacillus  1 g Oral TID WC  . midodrine  10 mg Oral TID  . multivitamin-lutein   Oral Daily  . niacin  1,000 mg Oral Daily  . pantoprazole  40 mg Oral Daily  . potassium chloride  40 mEq Oral Daily  . potassium chloride  40 mEq Oral Q4H  . rivaroxaban  20 mg Oral Daily  . sodium chloride flush  10-40 mL Intracatheter Q12H  . sodium chloride flush  3 mL Intravenous Q12H  . tamsulosin  0.4 mg Oral QPC breakfast   Continuous Infusions: . sodium chloride    . cefTAZidime (FORTAZ)  IV 1 g (06/19/19 1257)   PRN Meds: sodium chloride, acetaminophen, albuterol, ALPRAZolam, guaiFENesin-dextromethorphan, ondansetron (ZOFRAN) IV, sodium chloride flush, sodium chloride flush, zolpidem   Vital Signs    Vitals:   06/19/19 0551 06/19/19 0730 06/19/19 0832 06/19/19 0834  BP: (!) 83/53 (!) 149/110 (!) 88/62 91/60  Pulse: 92 (!) 105  95  Resp: 17 (!) 22  20  Temp: 98.2 F (36.8 C) (!) 97.5 F (36.4 C)    TempSrc:  Axillary    SpO2: 98% 95%    Weight:      Height:        Intake/Output Summary (Last 24 hours) at 06/19/2019 1428 Last data filed at 06/19/2019 1300 Gross per 24 hour  Intake 580 ml  Output 2150 ml  Net -1570 ml   Last 3 Weights 06/18/2019 06/17/2019 06/16/2019  Weight (lbs) 265 lb 14 oz 258  lb 13.1 oz 259 lb 7.7 oz  Weight (kg) 120.6 kg 117.4 kg 117.7 kg      Telemetry    Not on telemetry-  ECG  Not obtained today  Physical Exam   GEN: No acute distress.   Neck: No JVD Cardiac:  Irregular irregular, tachycardia, no murmurs, rubs, or gallops.  Respiratory: Clear to auscultation bilaterally. GI: Soft, nontender, non-distended  MS:  SCDs in place.  Trace edema in thighs; No deformity. Neuro:  Nonfocal  Psych: Normal affect   Labs    High Sensitivity Troponin:   Recent Labs  Lab 06/12/19 0100  TROPONINIHS 34*      Chemistry Recent Labs  Lab 06/17/19 0558 06/18/19 0532 06/19/19 0521  NA 139 137 139  K 3.5 3.6 3.4*  CL 88* 88* 83*  CO2 39* 38* 40*  GLUCOSE 135* 109* 107*  BUN 26* 26* 26*  CREATININE 0.98 1.03 0.89  CALCIUM 8.9 8.8* 8.9  GFRNONAA >60 >60 >60  GFRAA >60 >60 >60  ANIONGAP 12 11 16*     Hematology Recent Labs  Lab 06/17/19 0558 06/18/19 0532 06/19/19 0521  WBC 12.7* 12.5* 12.2*  RBC 3.10* 3.27* 3.18*  HGB 7.7* 8.0* 7.9*  HCT 25.1* 26.4* 26.4*  MCV 81.0 80.7 83.0  MCH 24.8* 24.5* 24.8*  MCHC 30.7 30.3 29.9*  RDW 17.0* 16.9* 16.7*  PLT 267 307 319    BNPNo results for input(s): BNP, PROBNP in the last 168 hours.   DDimer No results for input(s): DDIMER in the last 168 hours.   Radiology    No results found.  Cardiac Studies   TTEcho 01/2019: 1. The left ventricle has moderately reduced systolic function, with an ejection fraction of 35-40%. The cavity size was normal. Left ventricular diastolic Doppler parameters are indeterminate. Probably severe hypokinesis of mid-distal anterior,  ateroseptal and apical myocardium. 2. The right ventricle has normal systolic function. The cavity was normal. There is no increase in right ventricular wall thickness. 3. Left atrial size was moderately dilated. 4. Very poor image quality in spite of using Definity. Valves not well visualized. Consider different diagnostic testing if  indicated. 5. The aortic valve was not well visualized. Aortic valve regurgitation was not assessed by color flow Doppler.  Patient Profile     68 y.o. male with history of CAD status post PCI to LAD, cax, OM, ischemic cardiomyopathy last EF 35 to 40%, ICD placement 2015, persistent A. fib on Xarelto, history of a flutter status post cardioversion in 2005, status post ablation 2015 being seen due to worsening edema.  Assessment & Plan    Patient symptoms are much improved compared to admission although he is still fluid overloaded.  His Lasix was increased to 80 mg twice daily, he is net -2.9 L over the past 24 hours.  Creatinine has stayed normal.  HFrEF Continue Lasix at current dose of 80 twice daily. Avoiding beta-blockers, angiotensin receptor blockers due to low blood pressures.  Persistent atrial fibrillation Heart rate control of beta-blockers. Continue Xarelto 20 mg daily.  History of CAD/PCI -Continue aspirin and statin.      Signed, Kate Sable, MD  06/19/2019, 2:28 PM

## 2019-06-19 NOTE — Progress Notes (Signed)
PROGRESS NOTE    Colton Lamb  OEH:[id number] DOB: 05/09/1951 DOA: 06/12/2019 PCP: [name], MD    Brief Narrative:  Colton Lamb  is a 68 y.o. obese Caucasian male with a known history of systolic CHF, coronary artery disease hypertension dyslipidemia and Crohn's disease, presented to the emergency room with acute onset of worsening dyspnea with associated dry cough and wheezing for the last 24 hours without fever or chills.  He he admitted to orthopnea and paroxysmal nocturnal dyspnea as well as dyspnea on exertion with lower extremity edema.  No fever or chills.  No nausea or vomiting.  No dysuria, oliguria or hematuria or flank pain.  No recent exposure to COVID-19.  He was tested negative about a week ago.  Upon presentation to the emergency room, his heart rate was 102 and respiratory to 34 with pulse oximetry of 85% on CPAP and later on 100% on BiPAP.  His BNP was 731 and high-sensitivity troponin I of 34.  CBC showed leukocytosis and anemia with neutrophilia and CMP showed a CO2 of 42 with a BUN of 24 and creatinine 0.98 alk phos of 160 with otherwise normal LFTs except for albumin 2.9.  Venous blood gas with a pH 7.38 and bicarbonate of 50.9.  COVID-19 test is currently pending.  Portable chest ray showed vascular congestion and small pleural effusions with bibasal atelectasis that are new compared to prior exam.  His AICD was in place.  EKG showed atrial fibrillation with a rapid rate of 110 with left intravesicular block and prolonged QT interval with QTC of 568 MS.  In ED, the patient was given 40 mg of IV Lasix.  He will be admitted to stepdown unit for further evaluation and management.  Interim History: -11/18: bp still soft. -still on 2L O2, stat 98% 11/18: In/out: -2950 ml  Subjective: No CP or cough.  Has mild SOB. No fever or chills. No nausea, vomiting, AP or diarreha.   Assessment & Plan:   Principal Problem:   Acute on chronic combined systolic  and diastolic CHF (congestive heart failure) (HCC) Active Problems:   CAD S/P percutaneous coronary angioplasty - multiple PCIs   Atrial fibrillation ([place])   CROHN'S DISEASE-LARGE & SMALL INTESTINE   ICD (implantable cardioverter-defibrillator) in place   Diabetes mellitus type 2, uncontrolled, with complications ([place])   Acute respiratory failure (HCC)   Anemia   Stage IV pressure ulcer of sacral region (HCC)   Hypotension    1. Acute on chronic systolic CHF with subsequent acute hypoxic and likely hypercarbic respiratory failure requiring BiPAP.weaned offbipap. Card was consulted. Appreciate cards consult, they continue to follow - c/w diuresis, limited by hypotension -Receivedalbumin 25g q6 x 4 doses11/12, repeat 11/1525g x 4 - lasix 80 iv bid - cards added metolazone11/14withgood response, repeat 11/15 - pressors if inadequate response but appears to be diuresing - PICC placed 11/14 - strict I/o's - on Midodrine due to soft blood pressure - card add spironolactone 12.5 mg daily.\ - Increase furosemide to 80 mg IV twice daily. -Defer adding beta-blocker and ACE inhibitor/ARB in the setting of hypotension.  Persistent atrial fibrillation: ventricular rate reasonable in spite of discontinuation of beta-blocker. -Continue rivaroxaban, though low threshold for stopping in the setting of anemia.  Diabetes mellitus type 2, uncontrolled, with complications; - c/wsliding scale insulin,  -checkingblood glucose AC at bedtime, -diabetic diet  Dyslipidemia. -Lipitor 80 mg daily  Crohn's disease. -balsalazide.  5. BPH. -continue Proscar and Flomax.  6. Stage  4 sacrum decubiti POA; -wound care consult appreciated, continue with Aquacel, Kerlix, ABD pad and change daily, agree with repositioning every 2 hours -on Fortaz -ordered Bx on 11/18    DVT prophylaxis: on Xarelto Code Status: full code Family Communication: none, not at bedside  Disposition Plan: still on 2L O2, still has leukocytosis with WBC 12.2. still has soft Bp. not ready for discharge.   Consultants:   card  Procedures:    Antimicrobials:  Anti-infectives (From admission, onward)   Start     Dose/Rate Route Frequency Ordered Stop   06/17/19 0830  cefTAZidime (FORTAZ) 1 g in sodium chloride 0.9 % 100 mL IVPB     1 g 200 mL/hr over 30 Minutes Intravenous Every 8 hours 06/17/19 0818           Objective: Vitals:   06/19/19 1530 06/19/19 2046 06/19/19 2102 06/19/19 2122  BP: (!) 107/51 98/61 (!) 89/62 93/67  Pulse:  92 (!) 101 94  Resp:  20 20   Temp:    98.4 F (36.9 C)  TempSrc:    Axillary  SpO2:  96% 98% 99%  Weight:      Height:        Intake/Output Summary (Last 24 hours) at 06/19/2019 2208 Last data filed at 06/19/2019 2102 Gross per 24 hour  Intake 580 ml  Output 1900 ml  Net -1320 ml   Filed Weights   06/16/19 0500 06/17/19 0500 06/18/19 0437  Weight: 117.7 kg 117.4 kg 120.6 kg    Examination:  Physical Exam:  General: Not in acute distress HEENT: PERRL, EOMI, no scleral icterus, No JVD or bruit Cardiac: S1/S2, RRR, No murmurs, gallops or rubs Pulm: No rales, wheezing, rhonchi or rubs. Abd: Soft, nondistended, nontender, no rebound pain, no organomegaly, BS present Ext: has trace leg edema bilaterally. 1+DP/PT pulse bilaterally Musculoskeletal: No joint deformities, erythema, or stiffness, ROM full Skin: No rashes.  Neuro: Alert and oriented X3, cranial nerves II-XII grossly intact, moves all extremeties normally Psych: Patient is not psychotic, no suicidal or hemocidal ideation.    Data Reviewed: I have personally reviewed following labs and imaging studies  CBC: Recent Labs  Lab 06/15/19 0548 06/16/19 0531 06/17/19 0558 06/18/19 0532 06/19/19 0521  WBC 11.4* 12.1* 12.7* 12.5* 12.2*  NEUTROABS 9.1* 9.9* 10.6* 10.0* 9.9*  HGB 7.9* 7.8* 7.7* 8.0* 7.9*  HCT 27.3* 27.2* 25.1* 26.4* 26.4*  MCV 86.1 85.3  81.0 80.7 83.0  PLT 298 308 267 307 510   Basic Metabolic Panel: Recent Labs  Lab 06/13/19 0619 06/14/19 0355 06/15/19 0548 06/16/19 0531 06/17/19 0558 06/18/19 0532 06/19/19 0521  NA 140 144 142 141 139 137 139  K 3.3* 3.8 4.2 3.7 3.5 3.6 3.4*  CL 90* 93* 92* 90* 88* 88* 83*  CO2 39* 41* 39* 41* 39* 38* 40*  GLUCOSE 117* 113* 125* 120* 135* 109* 107*  BUN 23 23 23  25* 26* 26* 26*  CREATININE 0.92 0.95 1.00 1.00 0.98 1.03 0.89  CALCIUM 8.2* 8.3* 8.5* 8.6* 8.9 8.8* 8.9  MG 1.8 2.2  --   --  2.0 1.9 2.1   GFR: Estimated Creatinine Clearance: 106.5 mL/min (by C-G formula based on SCr of 0.89 mg/dL). Liver Function Tests: No results for input(s): AST, ALT, ALKPHOS, BILITOT, PROT, ALBUMIN in the last 168 hours. No results for input(s): LIPASE, AMYLASE in the last 168 hours. No results for input(s): AMMONIA in the last 168 hours. Coagulation Profile: No results for input(s): INR, PROTIME in  the last 168 hours. Cardiac Enzymes: No results for input(s): CKTOTAL, CKMB, CKMBINDEX, TROPONINI in the last 168 hours. BNP (last 3 results) No results for input(s): PROBNP in the last 8760 hours. HbA1C: No results for input(s): HGBA1C in the last 72 hours. CBG: Recent Labs  Lab 06/18/19 2140 06/19/19 0742 06/19/19 1147 06/19/19 1639 06/19/19 2116  GLUCAP 104* 97 148* 163* 164*   Lipid Profile: No results for input(s): CHOL, HDL, LDLCALC, TRIG, CHOLHDL, LDLDIRECT in the last 72 hours. Thyroid Function Tests: No results for input(s): TSH, T4TOTAL, FREET4, T3FREE, THYROIDAB in the last 72 hours. Anemia Panel: No results for input(s): VITAMINB12, FOLATE, FERRITIN, TIBC, IRON, RETICCTPCT in the last 72 hours. Sepsis Labs: No results for input(s): PROCALCITON, LATICACIDVEN in the last 168 hours.  Recent Results (from the past 240 hour(s))  SARS CORONAVIRUS 2 (TAT 6-24 HRS) Nasopharyngeal Nasopharyngeal Swab     Status: None   Collection Time: 06/12/19  1:00 AM   Specimen:  Nasopharyngeal Swab  Result Value Ref Range Status   SARS Coronavirus 2 NEGATIVE NEGATIVE Final    Comment: (NOTE) SARS-CoV-2 target nucleic acids are NOT DETECTED. The SARS-CoV-2 RNA is generally detectable in upper and lower respiratory specimens during the acute phase of infection. Negative results do not preclude SARS-CoV-2 infection, do not rule out co-infections with other pathogens, and should not be used as the sole basis for treatment or other patient management decisions. Negative results must be combined with clinical observations, patient history, and epidemiological information. The expected result is Negative. Fact Sheet for Patients: SugarRoll.be Fact Sheet for Healthcare Providers: https://www.woods-mathews.com/ This test is not yet approved or cleared by the [place] FDA and  has been authorized for detection and/or diagnosis of SARS-CoV-2 by FDA under an Emergency Use Authorization (EUA). This EUA will remain  in effect (meaning this test can be used) for the duration of the COVID-19 declaration under Section 56 4(b)(1) of the Act, 21 U.S.C. section 360bbb-3(b)(1), unless the authorization is terminated or revoked sooner. Performed at [facility], [place] [address], [place], [place] [address]   MRSA PCR Screening     Status: None   Collection Time: 06/12/19 11:00 PM   Specimen: Nasopharyngeal  Result Value Ref Range Status   MRSA by PCR NEGATIVE NEGATIVE Final    Comment:        The GeneXpert MRSA Assay (FDA approved for NASAL specimens only), is one component of a comprehensive MRSA colonization surveillance program. It is not intended to diagnose MRSA infection nor to guide or monitor treatment for MRSA infections. Performed at [facility], [address] [place], [place] [address]      RN Pressure Injury Documentation: Pressure Injury 03/18/19 Sacrum Circumferential Stage IV - Full  thickness tissue loss with exposed bone, tendon or muscle. wound has evolved into stage 4 pressure injury when assessed on 9/21 (Active)  03/18/19 1930  Location: Sacrum  Location Orientation: Circumferential  Staging: Stage IV - Full thickness tissue loss with exposed bone, tendon or muscle.  Wound Description (Comments): wound has evolved into stage 4 pressure injury when assessed on 9/21  Present on Admission: Yes     Pressure Injury 04/23/19 Scrotum Left Stage II -  Partial thickness loss of dermis presenting as a shallow open ulcer with a red, pink wound bed without slough. (Active)  04/23/19 1451  Location: Scrotum  Location Orientation: Left  Staging: Stage II -  Partial thickness loss of dermis presenting as a shallow open  ulcer with a red, pink wound bed without slough.  Wound Description (Comments):   Present on Admission:      Pressure Injury 04/25/19 Heel Right Stage II -  Partial thickness loss of dermis presenting as a shallow open ulcer with a red, pink wound bed without slough. skin was black and red on assessment (Active)  04/25/19 1711  Location: Heel  Location Orientation: Right  Staging: Stage II -  Partial thickness loss of dermis presenting as a shallow open ulcer with a red, pink wound bed without slough.  Wound Description (Comments): skin was black and red on assessment  Present on Admission: Yes       Radiology Studies: No results found.      Scheduled Meds: . aspirin EC  81 mg Oral Daily  . atorvastatin  80 mg Oral Daily  . balsalazide  750 mg Oral TID  . Chlorhexidine Gluconate Cloth  6 each Topical Q0600  . feeding supplement (NEPRO CARB STEADY)  237 mL Oral BID BM  . finasteride  5 mg Oral Daily  . furosemide  80 mg Intravenous Q12H  . insulin aspart  0-9 Units Subcutaneous TID WC  . insulin detemir  9 Units Subcutaneous Daily  . lactobacillus  1 g Oral TID WC  . midodrine  10 mg Oral TID  . multivitamin-lutein   Oral Daily  . niacin   1,000 mg Oral Daily  . pantoprazole  40 mg Oral Daily  . potassium chloride  40 mEq Oral Daily  . rivaroxaban  20 mg Oral Daily  . sodium chloride flush  10-40 mL Intracatheter Q12H  . sodium chloride flush  3 mL Intravenous Q12H  . tamsulosin  0.4 mg Oral QPC breakfast   Continuous Infusions: . sodium chloride    . cefTAZidime (FORTAZ)  IV 1 g (06/19/19 2138)     LOS: 7 days    Time spent: 30 min     Ivor Costa, DO [facility] PAGER is on [place]  If 7PM-7AM, please contact night-coverage [email or web address] Password [email or web address] 06/19/2019, 10:08 PM

## 2019-06-19 NOTE — Consult Note (Addendum)
[place] for Electrolyte Monitoring and Replacement   Colton Lamb is a 19 YOM with a hx of CHF, CAD, HTN, dyslipidemia, and Crohn's Disease who presented to the ED with acute onset of worsening dyspnea associated with dry cough and wheezing. He was placed on BiPAP and admitted to stepdown for further evaluation.  Pharmacy has been consulted for electrolyte monitoring and replacement.  Patient is receiving 80 mg IV Lasix q12h. He is on a carb modified diet and also getting NEPRO CARB STEADY 224m BID between meals. He is also receiving potassium chloride 164m PO daily inpatient (PTA med).  Recent Labs: Potassium (mmol/L)  Date Value  06/19/2019 3.4 (L)  11/22/2013 3.7   Magnesium (mg/dL)  Date Value  06/19/2019 2.1  11/22/2013 1.8   Calcium (mg/dL)  Date Value  06/19/2019 8.9   Calcium, Total (mg/dL)  Date Value  11/22/2013 9.0   Albumin (g/dL)  Date Value  06/12/2019 2.9 (L)  04/18/2018 4.3   Phosphorus (mg/dL)  Date Value  04/21/2019 4.8 (H)   Sodium (mmol/L)  Date Value  06/19/2019 139  05/09/2016 141  11/22/2013 133 (L)   Corrected Calcium: 9.9 mg/dL  Assessment/Plan: - K 3.4 - somewhat stable, on lasix. Will order an extra KCl 40 mEq PO x 2. - Changed daily KCl from 10 mEq to 40 mEq PO while on BID Lasix.  - No other electrolytes warranting replacement at this time. - Will check BMP with AM labs and adjust as necessary.  Goals of Therapy:  - K ~ 4 - Mg ~ 2 - Other electrolytes WNL   Thank you for allowing pharmacy to be a part of this patient's care.   WaPearla DubonnetPharmD Clinical Pharmacist 06/19/2019 7:28 AM

## 2019-06-20 DIAGNOSIS — D649 Anemia, unspecified: Secondary | ICD-10-CM | POA: Diagnosis not present

## 2019-06-20 DIAGNOSIS — I4811 Longstanding persistent atrial fibrillation: Secondary | ICD-10-CM

## 2019-06-20 DIAGNOSIS — E1165 Type 2 diabetes mellitus with hyperglycemia: Secondary | ICD-10-CM

## 2019-06-20 DIAGNOSIS — L89154 Pressure ulcer of sacral region, stage 4: Secondary | ICD-10-CM

## 2019-06-20 DIAGNOSIS — E118 Type 2 diabetes mellitus with unspecified complications: Secondary | ICD-10-CM | POA: Diagnosis not present

## 2019-06-20 DIAGNOSIS — I5043 Acute on chronic combined systolic (congestive) and diastolic (congestive) heart failure: Secondary | ICD-10-CM | POA: Diagnosis not present

## 2019-06-20 DIAGNOSIS — I25118 Atherosclerotic heart disease of native coronary artery with other forms of angina pectoris: Secondary | ICD-10-CM | POA: Diagnosis not present

## 2019-06-20 LAB — CBC WITH DIFFERENTIAL/PLATELET
Abs Immature Granulocytes: 0.05 10*3/uL (ref 0.00–0.07)
Basophils Absolute: 0.1 10*3/uL (ref 0.0–0.1)
Basophils Relative: 1 %
Eosinophils Absolute: 0.5 10*3/uL (ref 0.0–0.5)
Eosinophils Relative: 5 %
HCT: 25.1 % — ABNORMAL LOW (ref 39.0–52.0)
Hemoglobin: 7.7 g/dL — ABNORMAL LOW (ref 13.0–17.0)
Immature Granulocytes: 1 %
Lymphocytes Relative: 13 %
Lymphs Abs: 1.3 10*3/uL (ref 0.7–4.0)
MCH: 24.7 pg — ABNORMAL LOW (ref 26.0–34.0)
MCHC: 30.7 g/dL (ref 30.0–36.0)
MCV: 80.4 fL (ref 80.0–100.0)
Monocytes Absolute: 0.7 10*3/uL (ref 0.1–1.0)
Monocytes Relative: 7 %
Neutro Abs: 7.3 10*3/uL (ref 1.7–7.7)
Neutrophils Relative %: 73 %
Platelets: 301 10*3/uL (ref 150–400)
RBC: 3.12 MIL/uL — ABNORMAL LOW (ref 4.22–5.81)
RDW: 17.2 % — ABNORMAL HIGH (ref 11.5–15.5)
WBC: 9.9 10*3/uL (ref 4.0–10.5)
nRBC: 0 % (ref 0.0–0.2)

## 2019-06-20 LAB — BASIC METABOLIC PANEL
Anion gap: 10 (ref 5–15)
BUN: 23 mg/dL (ref 8–23)
CO2: 38 mmol/L — ABNORMAL HIGH (ref 22–32)
Calcium: 8.7 mg/dL — ABNORMAL LOW (ref 8.9–10.3)
Chloride: 93 mmol/L — ABNORMAL LOW (ref 98–111)
Creatinine, Ser: 1.03 mg/dL (ref 0.61–1.24)
GFR calc Af Amer: >60 mL/min (ref >60)
GFR calc non Af Amer: >60 mL/min (ref >60)
Glucose, Bld: 120 mg/dL — ABNORMAL HIGH (ref 70–99)
Potassium: 3.6 mmol/L (ref 3.5–5.1)
Sodium: 141 mmol/L (ref 135–145)

## 2019-06-20 LAB — MAGNESIUM: Magnesium: 1.9 mg/dL (ref 1.7–2.4)

## 2019-06-20 LAB — GLUCOSE, CAPILLARY
Glucose-Capillary: 113 mg/dL — ABNORMAL HIGH (ref 70–99)
Glucose-Capillary: 138 mg/dL — ABNORMAL HIGH (ref 70–99)
Glucose-Capillary: 141 mg/dL — ABNORMAL HIGH (ref 70–99)
Glucose-Capillary: 132 mg/dL — ABNORMAL HIGH (ref 70–99)

## 2019-06-20 MED ORDER — TORSEMIDE 20 MG PO TABS
40.0000 mg | ORAL_TABLET | Freq: Every day | ORAL | Status: DC
  Administered 2019-06-21: 40 mg via ORAL
  Filled 2019-06-20: qty 2

## 2019-06-20 MED ORDER — POTASSIUM CHLORIDE CRYS ER 20 MEQ PO TBCR
40.0000 meq | EXTENDED_RELEASE_TABLET | ORAL | Status: AC
  Administered 2019-06-20 (×2): 40 meq via ORAL
  Filled 2019-06-20 (×2): qty 2

## 2019-06-20 NOTE — Care Management Important Message (Signed)
Important Message  Patient Details  Name: Colton Lamb MRN: [id number] Date of Birth: 1950/11/06   Medicare Important Message Given:  Yes     [name] 06/20/2019, 11:06 AM

## 2019-06-20 NOTE — Progress Notes (Addendum)
PROGRESS NOTE    Colton Lamb  XYI:[id number] DOB: 11/30/1950 DOA: 06/12/2019 PCP: [name], MD    Brief Narrative:  Colton Lamb  is a 68 y.o. obese Caucasian male with a known history of systolic CHF, coronary artery disease hypertension dyslipidemia and Crohn's disease, presented to the emergency room with acute onset of worsening dyspnea with associated dry cough and wheezing for the last 24 hours without fever or chills.  He he admitted to orthopnea and paroxysmal nocturnal dyspnea as well as dyspnea on exertion with lower extremity edema.  No fever or chills.  No nausea or vomiting.  No dysuria, oliguria or hematuria or flank pain.  No recent exposure to COVID-19.  He was tested negative about a week ago.  Upon presentation to the emergency room, his heart rate was 102 and respiratory to 34 with pulse oximetry of 85% on CPAP and later on 100% on BiPAP.  His BNP was 731 and high-sensitivity troponin I of 34.  CBC showed leukocytosis and anemia with neutrophilia and CMP showed a CO2 of 42 with a BUN of 24 and creatinine 0.98 alk phos of 160 with otherwise normal LFTs except for albumin 2.9.  Venous blood gas with a pH 7.38 and bicarbonate of 50.9.  COVID-19 test is currently pending.  Portable chest ray showed vascular congestion and small pleural effusions with bibasal atelectasis that are new compared to prior exam.  His AICD was in place.  EKG showed atrial fibrillation with a rapid rate of 110 with left intravesicular block and prolonged QT interval with QTC of 568 MS.  In ED, the patient was given 40 mg of IV Lasix.  He will be admitted to stepdown unit for further evaluation and management.  Interim History: -11/18: bp still soft. -still on 2L O2, stat 98% 11/19: -500 ml -11/19: Bp is soft 88/60  Subjective: No CP or cough.  Has mild SOB. No fever or chills. No nausea, vomiting, AP or diarreha.   Assessment & Plan:   Principal Problem:   Acute on chronic  combined systolic and diastolic CHF (congestive heart failure) (HCC) Active Problems:   CAD S/P percutaneous coronary angioplasty - multiple PCIs   Atrial fibrillation ([place])   CROHN'S DISEASE-LARGE & SMALL INTESTINE   ICD (implantable cardioverter-defibrillator) in place   Diabetes mellitus type 2, uncontrolled, with complications ([place])   Acute respiratory failure (HCC)   Anemia   Stage IV pressure ulcer of sacral region (HCC)   Hypotension    1. Acute on chronic systolic CHF with subsequent acute hypoxic and likely hypercarbic respiratory failure requiring BiPAP.weaned offbipap. Card was consulted. Appreciate cards consult, they continue to follow - c/w diuresis, limited by hypotension -Receivedalbumin 25g q6 x 4 doses11/12, repeat 11/1525g x 4 - cards added metolazone11/14withgood response, repeat 11/15 - pressors if inadequate response but appears to be diuresing - PICC placed 11/14 - strict I/o's - on Midodrine due to soft blood pressure - card add spironolactone 12.5 mg daily.\ - Increase furosemide to 80 mg IV twice daily -->lasix is on hold due to soft bp. -Defer adding beta-blocker and ACE inhibitor/ARB in the setting of hypotension.  Persistent atrial fibrillation: ventricular rate reasonable in spite of discontinuation of beta-blocker. -Continue rivaroxaban, though low threshold for stopping in the setting of anemia.  Diabetes mellitus type 2, uncontrolled, with complications; - c/wsliding scale insulin,  -checkingblood glucose AC at bedtime, -diabetic diet  Dyslipidemia. -Lipitor 80 mg daily  Crohn's disease. -balsalazide.  5. BPH. -  continue Proscar and Flomax.  6. Stage 4 sacrum decubiti POA; -wound care consult appreciated, continue with Aquacel, Kerlix, ABD pad and change daily, agree with repositioning every 2 hours. WBC improved 12.2 --> 9.9 -on Fortaz -ordered Bx on 11/18 --> no grow so far   DVT prophylaxis: on  Xarelto Code Status: full code Family Communication: none, not at bedside Disposition Plan: still on 1L O2, still has leukocytosis with WBC 12.2. still has soft Bp. SM/CM is working on finding SNF bed   Consultants:   card  Procedures:    Antimicrobials:  Anti-infectives (From admission, onward)   Start     Dose/Rate Route Frequency Ordered Stop   06/17/19 0830  cefTAZidime (FORTAZ) 1 g in sodium chloride 0.9 % 100 mL IVPB     1 g 200 mL/hr over 30 Minutes Intravenous Every 8 hours 06/17/19 0818           Objective: Vitals:   06/20/19 1325 06/20/19 1329 06/20/19 1500 06/20/19 1545  BP:    115/77  Pulse:    100  Resp:    20  Temp:   (!) 97 F (36.1 C) (!) 97.5 F (36.4 C)  TempSrc:    Oral  SpO2: (!) 88% 93%  98%  Weight:      Height:        Intake/Output Summary (Last 24 hours) at 06/20/2019 1613 Last data filed at 06/20/2019 1503 Gross per 24 hour  Intake 540 ml  Output 1075 ml  Net -535 ml   Filed Weights   06/17/19 0500 06/18/19 0437 06/20/19 0500  Weight: 117.4 kg 120.6 kg 114.4 kg    Examination:  Physical Exam:  General: Not in acute distress HEENT: PERRL, EOMI, no scleral icterus, No JVD or bruit Cardiac: S1/S2, RRR, No murmurs, gallops or rubs Pulm: No rales, wheezing, rhonchi or rubs. Abd: Soft, nondistended, nontender, no rebound pain, no organomegaly, BS present Ext: has 1+ leg edema bilaterally. 1+DP/PT pulse bilaterally Musculoskeletal: No joint deformities, erythema, or stiffness, ROM full Skin: No rashes.  Neuro: Alert and oriented X3, cranial nerves II-XII grossly intact, moves all extremeties normally Psych: Patient is not psychotic, no suicidal or hemocidal ideation.    Data Reviewed: I have personally reviewed following labs and imaging studies  CBC: Recent Labs  Lab 06/16/19 0531 06/17/19 0558 06/18/19 0532 06/19/19 0521 06/20/19 0535  WBC 12.1* 12.7* 12.5* 12.2* 9.9  NEUTROABS 9.9* 10.6* 10.0* 9.9* 7.3  HGB 7.8*  7.7* 8.0* 7.9* 7.7*  HCT 27.2* 25.1* 26.4* 26.4* 25.1*  MCV 85.3 81.0 80.7 83.0 80.4  PLT 308 267 307 319 335   Basic Metabolic Panel: Recent Labs  Lab 06/14/19 0355  06/16/19 0531 06/17/19 0558 06/18/19 0532 06/19/19 0521 06/20/19 0535  NA 144   < > 141 139 137 139 141  K 3.8   < > 3.7 3.5 3.6 3.4* 3.6  CL 93*   < > 90* 88* 88* 83* 93*  CO2 41*   < > 41* 39* 38* 40* 38*  GLUCOSE 113*   < > 120* 135* 109* 107* 120*  BUN 23   < > 25* 26* 26* 26* 23  CREATININE 0.95   < > 1.00 0.98 1.03 0.89 1.03  CALCIUM 8.3*   < > 8.6* 8.9 8.8* 8.9 8.7*  MG 2.2  --   --  2.0 1.9 2.1 1.9   < > = values in this interval not displayed.   GFR: Estimated Creatinine Clearance: 89.6 mL/min (by  C-G formula based on SCr of 1.03 mg/dL). Liver Function Tests: No results for input(s): AST, ALT, ALKPHOS, BILITOT, PROT, ALBUMIN in the last 168 hours. No results for input(s): LIPASE, AMYLASE in the last 168 hours. No results for input(s): AMMONIA in the last 168 hours. Coagulation Profile: No results for input(s): INR, PROTIME in the last 168 hours. Cardiac Enzymes: No results for input(s): CKTOTAL, CKMB, CKMBINDEX, TROPONINI in the last 168 hours. BNP (last 3 results) No results for input(s): PROBNP in the last 8760 hours. HbA1C: No results for input(s): HGBA1C in the last 72 hours. CBG: Recent Labs  Lab 06/19/19 1147 06/19/19 1639 06/19/19 2116 06/20/19 0844 06/20/19 1152  GLUCAP 148* 163* 164* 113* 138*   Lipid Profile: No results for input(s): CHOL, HDL, LDLCALC, TRIG, CHOLHDL, LDLDIRECT in the last 72 hours. Thyroid Function Tests: No results for input(s): TSH, T4TOTAL, FREET4, T3FREE, THYROIDAB in the last 72 hours. Anemia Panel: No results for input(s): VITAMINB12, FOLATE, FERRITIN, TIBC, IRON, RETICCTPCT in the last 72 hours. Sepsis Labs: No results for input(s): PROCALCITON, LATICACIDVEN in the last 168 hours.  Recent Results (from the past 240 hour(s))  SARS CORONAVIRUS 2 (TAT  6-24 HRS) Nasopharyngeal Nasopharyngeal Swab     Status: None   Collection Time: 06/12/19  1:00 AM   Specimen: Nasopharyngeal Swab  Result Value Ref Range Status   SARS Coronavirus 2 NEGATIVE NEGATIVE Final    Comment: (NOTE) SARS-CoV-2 target nucleic acids are NOT DETECTED. The SARS-CoV-2 RNA is generally detectable in upper and lower respiratory specimens during the acute phase of infection. Negative results do not preclude SARS-CoV-2 infection, do not rule out co-infections with other pathogens, and should not be used as the sole basis for treatment or other patient management decisions. Negative results must be combined with clinical observations, patient history, and epidemiological information. The expected result is Negative. Fact Sheet for Patients: SugarRoll.be Fact Sheet for Healthcare Providers: https://www.woods-mathews.com/ This test is not yet approved or cleared by the [place] FDA and  has been authorized for detection and/or diagnosis of SARS-CoV-2 by FDA under an Emergency Use Authorization (EUA). This EUA will remain  in effect (meaning this test can be used) for the duration of the COVID-19 declaration under Section 56 4(b)(1) of the Act, 21 U.S.C. section 360bbb-3(b)(1), unless the authorization is terminated or revoked sooner. Performed at [facility], [place] [address] [place], [place] [address]   MRSA PCR Screening     Status: None   Collection Time: 06/12/19 11:00 PM   Specimen: Nasopharyngeal  Result Value Ref Range Status   MRSA by PCR NEGATIVE NEGATIVE Final    Comment:        The GeneXpert MRSA Assay (FDA approved for NASAL specimens only), is one component of a comprehensive MRSA colonization surveillance program. It is not intended to diagnose MRSA infection nor to guide or monitor treatment for MRSA infections. Performed at [facility], [address] [place], [place] [address]    CULTURE, BLOOD (ROUTINE X 2) w Reflex to ID Panel     Status: None (Preliminary result)   Collection Time: 06/19/19  8:15 AM   Specimen: BLOOD  Result Value Ref Range Status   Specimen Description BLOOD BLOOD LEFT HAND  Final   Special Requests   Final    BOTTLES DRAWN AEROBIC AND ANAEROBIC Blood Culture results may not be optimal due to an excessive volume of blood received in culture bottles   Culture   Final  NO GROWTH < 24 HOURS Performed at [facility], [address] [place], [place] [address]    Report Status PENDING  Incomplete  CULTURE, BLOOD (ROUTINE X 2) w Reflex to ID Panel     Status: None (Preliminary result)   Collection Time: 06/19/19  9:55 AM   Specimen: BLOOD  Result Value Ref Range Status   Specimen Description BLOOD BLOOD LEFT HAND  Final   Special Requests   Final    BOTTLES DRAWN AEROBIC AND ANAEROBIC Blood Culture results may not be optimal due to an excessive volume of blood received in culture bottles   Culture  Setup Time PENDING  Incomplete   Culture   Final    NO GROWTH < 24 HOURS Performed at [facility], [address] [place], [place] [address]    Report Status PENDING  Incomplete     RN Pressure Injury Documentation: Pressure Injury 03/18/19 Sacrum Circumferential Stage IV - Full thickness tissue loss with exposed bone, tendon or muscle. wound has evolved into stage 4 pressure injury when assessed on 9/21 (Active)  03/18/19 1930  Location: Sacrum  Location Orientation: Circumferential  Staging: Stage IV - Full thickness tissue loss with exposed bone, tendon or muscle.  Wound Description (Comments): wound has evolved into stage 4 pressure injury when assessed on 9/21  Present on Admission: Yes     Pressure Injury 04/23/19 Scrotum Left Stage II -  Partial thickness loss of dermis presenting as a shallow open ulcer with a red, pink wound bed without slough. (Active)  04/23/19 1451  Location: Scrotum  Location Orientation:  Left  Staging: Stage II -  Partial thickness loss of dermis presenting as a shallow open ulcer with a red, pink wound bed without slough.  Wound Description (Comments):   Present on Admission:      Pressure Injury 04/25/19 Heel Right Stage II -  Partial thickness loss of dermis presenting as a shallow open ulcer with a red, pink wound bed without slough. skin was black and red on assessment (Active)  04/25/19 1711  Location: Heel  Location Orientation: Right  Staging: Stage II -  Partial thickness loss of dermis presenting as a shallow open ulcer with a red, pink wound bed without slough.  Wound Description (Comments): skin was black and red on assessment  Present on Admission: Yes       Radiology Studies: No results found.      Scheduled Meds: . aspirin EC  81 mg Oral Daily  . atorvastatin  80 mg Oral Daily  . balsalazide  750 mg Oral TID  . Chlorhexidine Gluconate Cloth  6 each Topical Q0600  . feeding supplement (NEPRO CARB STEADY)  237 mL Oral BID BM  . finasteride  5 mg Oral Daily  . furosemide  80 mg Intravenous Q12H  . insulin aspart  0-9 Units Subcutaneous TID WC  . insulin detemir  9 Units Subcutaneous Daily  . lactobacillus  1 g Oral TID WC  . midodrine  10 mg Oral TID  . multivitamin-lutein   Oral Daily  . niacin  1,000 mg Oral Daily  . pantoprazole  40 mg Oral Daily  . potassium chloride  40 mEq Oral Daily  . potassium chloride  40 mEq Oral Q4H  . rivaroxaban  20 mg Oral Daily  . sodium chloride flush  10-40 mL Intracatheter Q12H  . sodium chloride flush  3 mL Intravenous Q12H  . tamsulosin  0.4 mg Oral QPC breakfast  . [  START ON 06/21/2019] torsemide  40 mg Oral Daily   Continuous Infusions: . sodium chloride    . cefTAZidime (FORTAZ)  IV Stopped (06/20/19 1329)     LOS: 8 days    Time spent: 44 min     Ivor Costa, DO [facility] PAGER is on [place]  If 7PM-7AM, please contact night-coverage [email or web address] Password [facility] 06/20/2019,  4:13 PM

## 2019-06-20 NOTE — Progress Notes (Signed)
Dr Blaine Hamper aware that lasix held because BP still running low

## 2019-06-20 NOTE — Progress Notes (Signed)
Attempted to wean pt from 1L [place], pt dropped down to 87-88% at rest in bed on room air, reapplied 1L [place], o2 sat 92-93%

## 2019-06-20 NOTE — Progress Notes (Signed)
Progress Note  Patient Name: Colton Lamb Date of Encounter: 06/20/2019  Primary Cardiologist: Ida Rogue, MD  Subjective   Feels that breathing and fluid status pretty much at baseline this AM.  Inpatient Medications    Scheduled Meds:  aspirin EC  81 mg Oral Daily   atorvastatin  80 mg Oral Daily   balsalazide  750 mg Oral TID   Chlorhexidine Gluconate Cloth  6 each Topical Q0600   feeding supplement (NEPRO CARB STEADY)  237 mL Oral BID BM   finasteride  5 mg Oral Daily   furosemide  80 mg Intravenous Q12H   insulin aspart  0-9 Units Subcutaneous TID WC   insulin detemir  9 Units Subcutaneous Daily   lactobacillus  1 g Oral TID WC   midodrine  10 mg Oral TID   multivitamin-lutein   Oral Daily   niacin  1,000 mg Oral Daily   pantoprazole  40 mg Oral Daily   potassium chloride  40 mEq Oral Daily   potassium chloride  40 mEq Oral Q4H   rivaroxaban  20 mg Oral Daily   sodium chloride flush  10-40 mL Intracatheter Q12H   sodium chloride flush  3 mL Intravenous Q12H   tamsulosin  0.4 mg Oral QPC breakfast   Continuous Infusions:  sodium chloride     cefTAZidime (FORTAZ)  IV 1 g (06/20/19 0422)   PRN Meds: sodium chloride, acetaminophen, albuterol, ALPRAZolam, guaiFENesin-dextromethorphan, ondansetron (ZOFRAN) IV, sodium chloride flush, sodium chloride flush, zolpidem   Vital Signs    Vitals:   06/19/19 2122 06/20/19 0500 06/20/19 0512 06/20/19 0846  BP: 93/67  101/65 (!) 89/65  Pulse: 94  97 68  Resp:   20 16  Temp: 98.4 F (36.9 C)  98.6 F (37 C) (!) 97.4 F (36.3 C)  TempSrc: Axillary  Oral Oral  SpO2: 99%  99% 100%  Weight:  114.4 kg    Height:        Intake/Output Summary (Last 24 hours) at 06/20/2019 1027 Last data filed at 06/20/2019 0424 Gross per 24 hour  Intake 440 ml  Output 600 ml  Net -160 ml   Filed Weights   06/17/19 0500 06/18/19 0437 06/20/19 0500  Weight: 117.4 kg 120.6 kg 114.4 kg    Physical Exam     GEN: Well nourished, well developed, in no acute distress.  HEENT: Grossly normal.  Neck: Supple, obese, difficult to gauge jvp. No carotid bruits, or masses. Cardiac: IR, IR, distant, no murmurs, rubs, or gallops. No clubbing, cyanosis, edema.  Radials/DP/PT 1+ and equal bilaterally.  Respiratory:  Respirations regular and unlabored, diminished breath sounds bilaterally. GI: Obese, soft, nontender, nondistended, BS + x 4. MS: no deformity or atrophy. Skin: warm and dry, no rash. Neuro:  Strength and sensation are intact. Psych: AAOx3.  Normal affect.  Labs    Chemistry Recent Labs  Lab 06/18/19 0532 06/19/19 0521 06/20/19 0535  NA 137 139 141  K 3.6 3.4* 3.6  CL 88* 83* 93*  CO2 38* 40* 38*  GLUCOSE 109* 107* 120*  BUN 26* 26* 23  CREATININE 1.03 0.89 1.03  CALCIUM 8.8* 8.9 8.7*  GFRNONAA >60 >60 >60  GFRAA >60 >60 >60  ANIONGAP 11 16* 10     Hematology Recent Labs  Lab 06/18/19 0532 06/19/19 0521 06/20/19 0535  WBC 12.5* 12.2* 9.9  RBC 3.27* 3.18* 3.12*  HGB 8.0* 7.9* 7.7*  HCT 26.4* 26.4* 25.1*  MCV 80.7 83.0 80.4  MCH  24.5* 24.8* 24.7*  MCHC 30.3 29.9* 30.7  RDW 16.9* 16.7* 17.2*  PLT 307 319 301    Cardiac Enzymes  Recent Labs  Lab 06/12/19 0100  TROPONINIHS 34*     Radiology    No results found.  Telemetry    Afib, 90's - Personally Reviewed  Cardiac Studies   TTE 01/2019: 1. The left ventricle has moderately reduced systolic function, with an ejection fraction of 35-40%. The cavity size was normal. Left ventricular diastolic Doppler parameters are indeterminate. Probably severe hypokinesis of mid-distal anterior,  ateroseptal and apical myocardium.  2. The right ventricle has normal systolic function. The cavity was normal. There is no increase in right ventricular wall thickness.  3. Left atrial size was moderately dilated.  4. Very poor image quality in spite of using Definity. Valves not well visualized. Consider different diagnostic  testing if indicated.  5. The aortic valve was not well visualized. Aortic valve regurgitation was not assessed by color flow Doppler.   Patient Profile     68 y.o. male with history of CAD status post PCI to LAD, LCX, OM, ischemic cardiomyopathy last EF 35 to 40%, ICD placement 2015, persistent A. fib on Xarelto, history of a flutter status post cardioversion in 2005, status post ablation 2015 being seen due to worsening edema.   Assessment & Plan    1.  Acute on chronic HFrEF/ICM: EF 35-40% by echo 01/2019. +80 ml's yesterday. Minus 13.2L since admission.  Wt down from peak of 120.7 kg on 11/13 to 114.4 kg this am.  Renal fxn stable. Looking back @ weight, it appears that 114 represents his dry weight.  He is euvolemic on exam.  No  blocker, acei/arb/arni in setting of ongoing relative hypotension w/ periodic hypotension.  Spiro 12.5 prev considered, but not sure he will tolerate.  Will change to torsemide 55m daily to begin tomorrow (prev home dose).  If BP allows after switching to oral diuretic, can look to add low dose spiro in next two days.  2.  Persistent Afib:  Rate reasonably controlled in 60's to 90's despite d/c of  blocker.  Could consider digoxin if rates elevate, esp in setting of LV dysfxn. He remains on xarelto.  H/H drifting down.  Low threshold to d/c xarelto.  3.  CAD/Elevated troponin: Minimal trop elevation in setting of hypotension and CHF - most likely represents demand ischemia.  No c/p.  Cont statin.  Consider d/c of ASA in light of ongoing xarelto - will d/w Dr. GRockey Situ  No  blocker 2/2 hypotension.  4.  Hypotension:  Stable on midodrine.  5.  Anemia:  H/H, MCV drifting down.  Hgb 7.7 this AM.  Rec PRBC for hgb < 8 given known CAD.  Signed, CMurray Hodgkins NP  06/20/2019, 10:27 AM    For questions or updates, please contact   Please consult [email or web address] for contact info under Cardiology/STEMI.

## 2019-06-20 NOTE — Consult Note (Signed)
[place] for Electrolyte Monitoring and Replacement   Colton Lamb is a 8 YOM with a hx of CHF, CAD, HTN, dyslipidemia, and Crohn's Disease who presented to the ED with acute onset of worsening dyspnea associated with dry cough and wheezing. He was placed on BiPAP and admitted to stepdown for further evaluation.  Pharmacy has been consulted for electrolyte monitoring and replacement.  Patient is receiving 80 mg IV Lasix q12h. He is on a carb modified diet and also getting NEPRO CARB STEADY 245m BID between meals. He is also receiving potassium chloride 442m PO daily inpatient (increased from 1047mPTA dose)  Recent Labs: Potassium (mmol/L)  Date Value  06/20/2019 3.6  11/22/2013 3.7   Magnesium (mg/dL)  Date Value  06/20/2019 1.9  11/22/2013 1.8   Calcium (mg/dL)  Date Value  06/20/2019 8.7 (L)   Calcium, Total (mg/dL)  Date Value  11/22/2013 9.0   Albumin (g/dL)  Date Value  06/12/2019 2.9 (L)  04/18/2018 4.3   Phosphorus (mg/dL)  Date Value  04/21/2019 4.8 (H)   Sodium (mmol/L)  Date Value  06/20/2019 141  05/09/2016 141  11/22/2013 133 (L)   Corrected Calcium: 9.9 mg/dL  Assessment/Plan: - K 3.6 - somewhat stable, on lasix 46m65mD. Will order an extra KCl 40 mEq PO x 2. - Changed daily KCl from 10 mEq to 40 mEq PO while on BID Lasix.  - No other electrolytes warranting replacement at this time. - Will check BMP with AM labs and adjust as necessary.  Goals of Therapy:  - K ~ 4 - Mg ~ 2 - Other electrolytes WNL   Thank you for allowing pharmacy to be a part of this patient's care.   WaliPearla DubonnetarmD Clinical Pharmacist 06/20/2019 7:23 AM

## 2019-06-20 NOTE — TOC Progression Note (Signed)
Transition of Care [facility]) - Progression Note    Patient Details  Name: Colton Lamb MRN: [id number] Date of Birth: [birth year]  Transition of Care [facility]) CM/SW [place], RN Phone Number: 06/20/2019, 10:21 AM  Clinical Narrative:    Care Manager continues to monitor for any additional needs, plan to DC to [facility] at DC   Expected Discharge Plan: [place] Barriers to Discharge: Continued Medical Work up  Expected Discharge Plan and Services Expected Discharge Plan: [place] Choice: [place] arrangements for the past 2 months: [place]                                       Social Determinants of Health (SDOH) Interventions    Readmission Risk Interventions Readmission Risk Prevention Plan 06/14/2019 04/24/2019 03/11/2019  Transportation Screening Complete Complete Complete  Social Work Consult for [place] Planning/Counseling - - Complete  Palliative Care Screening - - Not Applicable  Medication Review Press photographer) Complete Complete Complete  PCP or Specialist appointment within 3-5 days of discharge Complete Complete -  [place] or [place] - (No Data) -  SW Recovery Care/Counseling Consult Complete Complete -  Palliative Care Screening Not Applicable Complete -  Skilled Nursing Facility Complete Complete -  Some recent data might be hidden

## 2019-06-21 DIAGNOSIS — D508 Other iron deficiency anemias: Secondary | ICD-10-CM | POA: Diagnosis not present

## 2019-06-21 DIAGNOSIS — D649 Anemia, unspecified: Secondary | ICD-10-CM | POA: Diagnosis present

## 2019-06-21 DIAGNOSIS — I5043 Acute on chronic combined systolic (congestive) and diastolic (congestive) heart failure: Secondary | ICD-10-CM | POA: Diagnosis not present

## 2019-06-21 DIAGNOSIS — I4811 Longstanding persistent atrial fibrillation: Secondary | ICD-10-CM | POA: Diagnosis not present

## 2019-06-21 DIAGNOSIS — Z9861 Coronary angioplasty status: Secondary | ICD-10-CM

## 2019-06-21 DIAGNOSIS — I2510 Atherosclerotic heart disease of native coronary artery without angina pectoris: Secondary | ICD-10-CM | POA: Diagnosis not present

## 2019-06-21 LAB — BASIC METABOLIC PANEL
Anion gap: 12 (ref 5–15)
BUN: 26 mg/dL — ABNORMAL HIGH (ref 8–23)
CO2: 35 mmol/L — ABNORMAL HIGH (ref 22–32)
Calcium: 8.5 mg/dL — ABNORMAL LOW (ref 8.9–10.3)
Chloride: 94 mmol/L — ABNORMAL LOW (ref 98–111)
Creatinine, Ser: 0.88 mg/dL (ref 0.61–1.24)
GFR calc Af Amer: >60 mL/min (ref >60)
GFR calc non Af Amer: >60 mL/min (ref >60)
Glucose, Bld: 128 mg/dL — ABNORMAL HIGH (ref 70–99)
Potassium: 4.2 mmol/L (ref 3.5–5.1)
Sodium: 141 mmol/L (ref 135–145)

## 2019-06-21 LAB — CBC WITH DIFFERENTIAL/PLATELET
Abs Immature Granulocytes: 0.05 10*3/uL (ref 0.00–0.07)
Basophils Absolute: 0.1 10*3/uL (ref 0.0–0.1)
Basophils Relative: 1 %
Eosinophils Absolute: 0.5 10*3/uL (ref 0.0–0.5)
Eosinophils Relative: 5 %
HCT: 26.1 % — ABNORMAL LOW (ref 39.0–52.0)
Hemoglobin: 7.7 g/dL — ABNORMAL LOW (ref 13.0–17.0)
Immature Granulocytes: 1 %
Lymphocytes Relative: 11 %
Lymphs Abs: 1.2 10*3/uL (ref 0.7–4.0)
MCH: 24.6 pg — ABNORMAL LOW (ref 26.0–34.0)
MCHC: 29.5 g/dL — ABNORMAL LOW (ref 30.0–36.0)
MCV: 83.4 fL (ref 80.0–100.0)
Monocytes Absolute: 0.7 10*3/uL (ref 0.1–1.0)
Monocytes Relative: 6 %
Neutro Abs: 8.1 10*3/uL — ABNORMAL HIGH (ref 1.7–7.7)
Neutrophils Relative %: 76 %
Platelets: 330 10*3/uL (ref 150–400)
RBC: 3.13 MIL/uL — ABNORMAL LOW (ref 4.22–5.81)
RDW: 17 % — ABNORMAL HIGH (ref 11.5–15.5)
WBC: 10.6 10*3/uL — ABNORMAL HIGH (ref 4.0–10.5)
nRBC: 0 % (ref 0.0–0.2)

## 2019-06-21 LAB — GLUCOSE, CAPILLARY
Glucose-Capillary: 113 mg/dL — ABNORMAL HIGH (ref 70–99)
Glucose-Capillary: 137 mg/dL — ABNORMAL HIGH (ref 70–99)
Glucose-Capillary: 142 mg/dL — ABNORMAL HIGH (ref 70–99)
Glucose-Capillary: 160 mg/dL — ABNORMAL HIGH (ref 70–99)

## 2019-06-21 LAB — IRON AND TIBC
Iron: 29 ug/dL — ABNORMAL LOW (ref 45–182)
Saturation Ratios: 12 % — ABNORMAL LOW (ref 17.9–39.5)
TIBC: 247 ug/dL — ABNORMAL LOW (ref 250–450)
UIBC: 218 ug/dL

## 2019-06-21 LAB — FERRITIN: Ferritin: 95 ng/mL (ref 24–336)

## 2019-06-21 LAB — MAGNESIUM: Magnesium: 2.2 mg/dL (ref 1.7–2.4)

## 2019-06-21 MED ORDER — PSEUDOEPHEDRINE HCL 30 MG PO TABS
60.0000 mg | ORAL_TABLET | Freq: Four times a day (QID) | ORAL | Status: DC
  Administered 2019-06-21 – 2019-06-24 (×12): 60 mg via ORAL
  Filled 2019-06-21 (×14): qty 2

## 2019-06-21 MED ORDER — MIDODRINE HCL 5 MG PO TABS
15.0000 mg | ORAL_TABLET | Freq: Three times a day (TID) | ORAL | Status: DC
  Administered 2019-06-22 – 2019-06-25 (×8): 15 mg via ORAL
  Filled 2019-06-21 (×8): qty 3

## 2019-06-21 MED ORDER — FUROSEMIDE 10 MG/ML IJ SOLN
80.0000 mg | Freq: Two times a day (BID) | INTRAMUSCULAR | Status: DC
  Administered 2019-06-21 – 2019-06-23 (×4): 80 mg via INTRAVENOUS
  Filled 2019-06-21 (×4): qty 8

## 2019-06-21 NOTE — Progress Notes (Signed)
Progress Note  Patient Name: Colton Lamb Date of Encounter: 06/21/2019  Primary Cardiologist: Ida Rogue, MD  Subjective   Thinks breathing is about the same as yesterday, though noted to be mildly dyspneic after lung exam.  No c/p.  Inpatient Medications    Scheduled Meds: . aspirin EC  81 mg Oral Daily  . atorvastatin  80 mg Oral Daily  . balsalazide  750 mg Oral TID  . Chlorhexidine Gluconate Cloth  6 each Topical Q0600  . feeding supplement (NEPRO CARB STEADY)  237 mL Oral BID BM  . finasteride  5 mg Oral Daily  . insulin aspart  0-9 Units Subcutaneous TID WC  . insulin detemir  9 Units Subcutaneous Daily  . lactobacillus  1 g Oral TID WC  . midodrine  10 mg Oral TID  . multivitamin-lutein   Oral Daily  . niacin  1,000 mg Oral Daily  . pantoprazole  40 mg Oral Daily  . potassium chloride  40 mEq Oral Daily  . rivaroxaban  20 mg Oral Daily  . sodium chloride flush  10-40 mL Intracatheter Q12H  . sodium chloride flush  3 mL Intravenous Q12H  . tamsulosin  0.4 mg Oral QPC breakfast  . torsemide  40 mg Oral Daily   Continuous Infusions: . sodium chloride    . cefTAZidime (FORTAZ)  IV 1 g (06/21/19 0527)   PRN Meds: sodium chloride, acetaminophen, albuterol, ALPRAZolam, guaiFENesin-dextromethorphan, ondansetron (ZOFRAN) IV, sodium chloride flush, sodium chloride flush, zolpidem   Vital Signs    Vitals:   06/21/19 0533 06/21/19 0600 06/21/19 0753 06/21/19 0850  BP: 91/66  93/64 103/66  Pulse: 91  92 88  Resp: 20  18 19   Temp: (!) 97.4 F (36.3 C)  (!) 97.5 F (36.4 C) (!) 97.5 F (36.4 C)  TempSrc: Oral  Oral Oral  SpO2: 99%  100% 99%  Weight:  120.6 kg    Height:        Intake/Output Summary (Last 24 hours) at 06/21/2019 1101 Last data filed at 06/21/2019 0030 Gross per 24 hour  Intake 440 ml  Output 1025 ml  Net -585 ml   Filed Weights   06/18/19 0437 06/20/19 0500 06/21/19 0600  Weight: 120.6 kg 114.4 kg 120.6 kg    Physical Exam    GEN: Obese, in no acute distress.  HEENT: Grossly normal.  Neck: Supple, no carotid bruits, or masses. Difficult to gauge jvp 2/2 beard and body habitus. Cardiac: IR, IR, distant, no murmurs, rubs, or gallops. No clubbing, cyanosis.  No ankle edema but but posterior thigh/hip edema noted.  Radials/DP/PT 2+ and equal bilaterally.  Respiratory:  Respirations regular and unlabored, clear to auscultation bilaterally. GI: Firmer this AM w/ flank edema.  Non-tender BS + x 4. MS: no deformity or atrophy. Skin: warm and dry, no rash. Neuro:  Strength and sensation are intact. Psych: AAOx3.  Normal affect.  Labs    Chemistry Recent Labs  Lab 06/19/19 0521 06/20/19 0535 06/21/19 0521  NA 139 141 141  K 3.4* 3.6 4.2  CL 83* 93* 94*  CO2 40* 38* 35*  GLUCOSE 107* 120* 128*  BUN 26* 23 26*  CREATININE 0.89 1.03 0.88  CALCIUM 8.9 8.7* 8.5*  GFRNONAA >60 >60 >60  GFRAA >60 >60 >60  ANIONGAP 16* 10 12     Hematology Recent Labs  Lab 06/19/19 0521 06/20/19 0535 06/21/19 0521  WBC 12.2* 9.9 10.6*  RBC 3.18* 3.12* 3.13*  HGB 7.9* 7.7*  7.7*  HCT 26.4* 25.1* 26.1*  MCV 83.0 80.4 83.4  MCH 24.8* 24.7* 24.6*  MCHC 29.9* 30.7 29.5*  RDW 16.7* 17.2* 17.0*  PLT 319 301 330    Cardiac Enzymes  Recent Labs  Lab 06/12/19 0100  TROPONINIHS 34*    Radiology    No results found.  Telemetry    Afib, 90's to low 100's - Personally Reviewed  Cardiac Studies   TTE 01/2019: 1. The left ventricle has moderately reduced systolic function, with an ejection fraction of 35-40%. The cavity size was normal. Left ventricular diastolic Doppler parameters are indeterminate. Probably severe hypokinesis of mid-distal anterior,  ateroseptal and apical myocardium. 2. The right ventricle has normal systolic function. The cavity was normal. There is no increase in right ventricular wall thickness. 3. Left atrial size was moderately dilated. 4. Very poor image quality in spite of using Definity.  Valves not well visualized. Consider different diagnostic testing if indicated. 5. The aortic valve was not well visualized. Aortic valve regurgitation was not assessed by color flow Doppler.  Patient Profile     68 y.o.malewith history of CAD status post PCI to LAD, LCX, OM, ischemic cardiomyopathy last EF 35 to 40%, ICD placement 2015, persistent A. fib on Xarelto, history of a flutter status post cardioversion in 2005, status post ablation 2015 being seen due to worsening edema.   Assessment & Plan    1. Acute on chornic HFrEF/ICM: EF 35-40% by echo 01/2019.  Minus 585 yesterday, 13.4L since admission. Wt likely is listed as up 6kg - bedscale.  He is more volume overloaded today and became short of breath following exam.  Abd firmer today with ongoing flank and post thigh/hip edema.  Review of meds shows that he did not get any lasix on 11/18 or 11/19 due to soft BPs.  He did get oral torsemide this AM.  I am going to change him back to lasix 80 IV bid w/ hold parameter for sbp < 61mHg. No  blocker, acei, arb/arni/mra in setting of ongoing relative hypotension.  2.  Persistent Afib: rate 90's to low 100's this AM.   blocker d/c'd 2/2 relative hypotension.  Remains on xarelto.  H/H stable this AM.  3.  CAD/Elevated troponin: minimal trop elevation in setting of hypotension and CHF - most likely represents demand ischemia. No c/p. Cont stat. No  blocker 2/2 hypotension. In setting of extensive CAD w/ multiple prior stents, cont low dose ASA.  Cont statin.  4.  Hypotension:  Stable on midodrine.  5.  Anemia: H/H stable this AM.  Will need PRBC's if drops further w/ h/o extensive CAD. Fe low, Ferritin nl, TIBC low.  Signed, CMurray Hodgkins NP  06/21/2019, 11:01 AM    For questions or updates, please contact   Please consult [email or web address] for contact info under Cardiology/STEMI.

## 2019-06-21 NOTE — Progress Notes (Signed)
Per Dr Rockey Situ do not hold lasix with low BP

## 2019-06-21 NOTE — Progress Notes (Addendum)
PROGRESS NOTE    Colton Lamb  ZOX:[id number] DOB: 08/13/1950 DOA: 06/12/2019 PCP: [name], MD    Brief Narrative:  Colton Lamb  is a 68 y.o. obese Caucasian male with a known history of systolic CHF, coronary artery disease hypertension dyslipidemia and Crohn's disease, presented to the emergency room with acute onset of worsening dyspnea with associated dry cough and wheezing for the last 24 hours without fever or chills.  He he admitted to orthopnea and paroxysmal nocturnal dyspnea as well as dyspnea on exertion with lower extremity edema.  No fever or chills.  No nausea or vomiting.  No dysuria, oliguria or hematuria or flank pain.  No recent exposure to COVID-19.  He was tested negative about a week ago.  Upon presentation to the emergency room, his heart rate was 102 and respiratory to 34 with pulse oximetry of 85% on CPAP and later on 100% on BiPAP.  His BNP was 731 and high-sensitivity troponin I of 34.  CBC showed leukocytosis and anemia with neutrophilia and CMP showed a CO2 of 42 with a BUN of 24 and creatinine 0.98 alk phos of 160 with otherwise normal LFTs except for albumin 2.9.  Venous blood gas with a pH 7.38 and bicarbonate of 50.9.  COVID-19 test is currently pending.  Portable chest ray showed vascular congestion and small pleural effusions with bibasal atelectasis that are new compared to prior exam.  His AICD was in place.  EKG showed atrial fibrillation with a rapid rate of 110 with left intravesicular block and prolonged QT interval with QTC of 568 MS.  In ED, the patient was given 40 mg of IV Lasix.  He will be admitted to stepdown unit for further evaluation and management.  Interim History: -11/18: bp still soft. -still on 2L O2, stat 98% -11/20: Bp is soft 89/ 65 - 11/20: card increased midodrine dose from 10 to 15 mg tid due to soft blood pressure; also added pseudoephedrine 60 mg every 6 hours  Subjective: No CP or cough.  Has mild SOB. No  fever or chills. No nausea, vomiting, AP or diarreha.   Assessment & Plan:   Principal Problem:   Acute on chronic combined systolic and diastolic CHF (congestive heart failure) (HCC) Active Problems:   CAD S/P percutaneous coronary angioplasty - multiple PCIs   Atrial fibrillation ([place])   CROHN'S DISEASE-LARGE & SMALL INTESTINE   ICD (implantable cardioverter-defibrillator) in place   Diabetes mellitus type 2, uncontrolled, with complications ([place])   Acute respiratory failure (HCC)   Anemia   Stage IV pressure ulcer of sacral region ([place])   Hypotension   Normocytic anemia  1. Acute on chronic systolic CHF with subsequent acute hypoxic and likely hypercarbic respiratory failure requiring BiPAP.weaned offbipap. Card was consulted. Appreciate cards consult, they continue to follow - c/w diuresis, limited by hypotension -Receivedalbumin 25g q6 x 4 doses11/12, repeat 11/1525g x 4 - furosemide to 80 mg IV twice daily  - Defer adding beta-blocker and ACE inhibitor/ARB in the setting of hypotension.  - Card increased midodrine dose from 10 to 15 mg tid due to soft blood pressure; also added pseudoephedrine 60 mg every 6 hours  Persistent atrial fibrillation: ventricular rate reasonable in spite of discontinuation of beta-blocker. -Continue rivaroxaban, though low threshold for stopping in the setting of anemia.  Diabetes mellitus type 2, uncontrolled, with complications; - c/wsliding scale insulin,  -checkingblood glucose AC at bedtime, -diabetic diet  Dyslipidemia. -Lipitor 80 mg daily  BPH. -continue  Proscar and Flomax.  Stage 4 sacrum decubiti POA; -wound care consult appreciated, continue with Aquacel, Kerlix, ABD pad and change daily, agree with repositioning every 2 hours. WBC improved 12.2 --> 9.9 -->10.7 -on Fortaz -ordered Bx on 11/18 --> no grow so far  Normocytic anemia: hgn slowly trending down, 7.7 now , Iron 29, ferritin 95, not completely  consistent iron deficiency. -check FOBT  CBC Latest Ref Rng & Units 06/21/2019 06/20/2019 06/19/2019  WBC 4.0 - 10.5 K/uL 10.6(H) 9.9 12.2(H)  Hemoglobin 13.0 - 17.0 g/dL 7.7(L) 7.7(L) 7.9(L)  Hematocrit 39.0 - 52.0 % 26.1(L) 25.1(L) 26.4(L)  Platelets 150 - 400 K/uL 330 301 319      DVT prophylaxis: on Xarelto Code Status: full code Family Communication: none, not at bedside Disposition Plan: still on 1L O2, still has soft Bp. SM/CM is working on finding SNF bed   Consultants:   card  Procedures:    Antimicrobials:  Anti-infectives (From admission, onward)   Start     Dose/Rate Route Frequency Ordered Stop   06/17/19 0830  cefTAZidime (FORTAZ) 1 g in sodium chloride 0.9 % 100 mL IVPB     1 g 200 mL/hr over 30 Minutes Intravenous Every 8 hours 06/17/19 0818           Objective: Vitals:   06/21/19 0600 06/21/19 0753 06/21/19 0850 06/21/19 1706  BP:  93/64 103/66 107/72  Pulse:  92 88 95  Resp:  18 19 17   Temp:  (!) 97.5 F (36.4 C) (!) 97.5 F (36.4 C) 97.8 F (36.6 C)  TempSrc:  Oral Oral   SpO2:  100% 99% 100%  Weight: 120.6 kg     Height:        Intake/Output Summary (Last 24 hours) at 06/21/2019 1916 Last data filed at 06/21/2019 1830 Gross per 24 hour  Intake 540 ml  Output 550 ml  Net -10 ml   Filed Weights   06/18/19 0437 06/20/19 0500 06/21/19 0600  Weight: 120.6 kg 114.4 kg 120.6 kg    Examination:  Physical Exam:  General: Not in acute distress HEENT: PERRL, EOMI, no scleral icterus, No JVD or bruit Cardiac: S1/S2, RRR, No murmurs, gallops or rubs Pulm: No rales, wheezing, rhonchi or rubs. Abd: Soft, nondistended, nontender, no rebound pain, no organomegaly, BS present Ext: has 1+ leg edema bilaterally. 2+ pitting thing edema. 1+DP/PT pulse bilaterally Musculoskeletal: No joint deformities, erythema, or stiffness, ROM full Skin: No rashes.  Neuro: Alert and oriented X3, cranial nerves II-XII grossly intact, moves all extremeties  normally Psych: Patient is not psychotic, no suicidal or hemocidal ideation.    Data Reviewed: I have personally reviewed following labs and imaging studies  CBC: Recent Labs  Lab 06/17/19 0558 06/18/19 0532 06/19/19 0521 06/20/19 0535 06/21/19 0521  WBC 12.7* 12.5* 12.2* 9.9 10.6*  NEUTROABS 10.6* 10.0* 9.9* 7.3 8.1*  HGB 7.7* 8.0* 7.9* 7.7* 7.7*  HCT 25.1* 26.4* 26.4* 25.1* 26.1*  MCV 81.0 80.7 83.0 80.4 83.4  PLT 267 307 319 301 732   Basic Metabolic Panel: Recent Labs  Lab 06/17/19 0558 06/18/19 0532 06/19/19 0521 06/20/19 0535 06/21/19 0521  NA 139 137 139 141 141  K 3.5 3.6 3.4* 3.6 4.2  CL 88* 88* 83* 93* 94*  CO2 39* 38* 40* 38* 35*  GLUCOSE 135* 109* 107* 120* 128*  BUN 26* 26* 26* 23 26*  CREATININE 0.98 1.03 0.89 1.03 0.88  CALCIUM 8.9 8.8* 8.9 8.7* 8.5*  MG 2.0 1.9 2.1 1.9  2.2   GFR: Estimated Creatinine Clearance: 107.7 mL/min (by C-G formula based on SCr of 0.88 mg/dL). Liver Function Tests: No results for input(s): AST, ALT, ALKPHOS, BILITOT, PROT, ALBUMIN in the last 168 hours. No results for input(s): LIPASE, AMYLASE in the last 168 hours. No results for input(s): AMMONIA in the last 168 hours. Coagulation Profile: No results for input(s): INR, PROTIME in the last 168 hours. Cardiac Enzymes: No results for input(s): CKTOTAL, CKMB, CKMBINDEX, TROPONINI in the last 168 hours. BNP (last 3 results) No results for input(s): PROBNP in the last 8760 hours. HbA1C: No results for input(s): HGBA1C in the last 72 hours. CBG: Recent Labs  Lab 06/20/19 1648 06/20/19 2209 06/21/19 0755 06/21/19 1317 06/21/19 1648  GLUCAP 141* 132* 113* 137* 142*   Lipid Profile: No results for input(s): CHOL, HDL, LDLCALC, TRIG, CHOLHDL, LDLDIRECT in the last 72 hours. Thyroid Function Tests: No results for input(s): TSH, T4TOTAL, FREET4, T3FREE, THYROIDAB in the last 72 hours. Anemia Panel: Recent Labs    06/21/19 0521  FERRITIN 95  TIBC 247*  IRON 29*    Sepsis Labs: No results for input(s): PROCALCITON, LATICACIDVEN in the last 168 hours.  Recent Results (from the past 240 hour(s))  SARS CORONAVIRUS 2 (TAT 6-24 HRS) Nasopharyngeal Nasopharyngeal Swab     Status: None   Collection Time: 06/12/19  1:00 AM   Specimen: Nasopharyngeal Swab  Result Value Ref Range Status   SARS Coronavirus 2 NEGATIVE NEGATIVE Final    Comment: (NOTE) SARS-CoV-2 target nucleic acids are NOT DETECTED. The SARS-CoV-2 RNA is generally detectable in upper and lower respiratory specimens during the acute phase of infection. Negative results do not preclude SARS-CoV-2 infection, do not rule out co-infections with other pathogens, and should not be used as the sole basis for treatment or other patient management decisions. Negative results must be combined with clinical observations, patient history, and epidemiological information. The expected result is Negative. Fact Sheet for Patients: SugarRoll.be Fact Sheet for Healthcare Providers: https://www.woods-mathews.com/ This test is not yet approved or cleared by the [place] FDA and  has been authorized for detection and/or diagnosis of SARS-CoV-2 by FDA under an Emergency Use Authorization (EUA). This EUA will remain  in effect (meaning this test can be used) for the duration of the COVID-19 declaration under Section 56 4(b)(1) of the Act, 21 U.S.C. section 360bbb-3(b)(1), unless the authorization is terminated or revoked sooner. Performed at [facility], [place] [address], [place], [place] [address]   MRSA PCR Screening     Status: None   Collection Time: 06/12/19 11:00 PM   Specimen: Nasopharyngeal  Result Value Ref Range Status   MRSA by PCR NEGATIVE NEGATIVE Final    Comment:        The GeneXpert MRSA Assay (FDA approved for NASAL specimens only), is one component of a comprehensive MRSA colonization surveillance program. It is not intended to  diagnose MRSA infection nor to guide or monitor treatment for MRSA infections. Performed at [facility], [address] [place], [place] [address]   CULTURE, BLOOD (ROUTINE X 2) w Reflex to ID Panel     Status: None (Preliminary result)   Collection Time: 06/19/19  8:15 AM   Specimen: BLOOD  Result Value Ref Range Status   Specimen Description BLOOD BLOOD LEFT HAND  Final   Special Requests   Final    BOTTLES DRAWN AEROBIC AND ANAEROBIC Blood Culture results may not be optimal due to an excessive volume of  blood received in culture bottles   Culture   Final    NO GROWTH 2 DAYS Performed at [facility], [address] [place], [place] [address]    Report Status PENDING  Incomplete  CULTURE, BLOOD (ROUTINE X 2) w Reflex to ID Panel     Status: None (Preliminary result)   Collection Time: 06/19/19  9:55 AM   Specimen: BLOOD  Result Value Ref Range Status   Specimen Description BLOOD BLOOD LEFT HAND  Final   Special Requests   Final    BOTTLES DRAWN AEROBIC AND ANAEROBIC Blood Culture results may not be optimal due to an excessive volume of blood received in culture bottles   Culture   Final    NO GROWTH 2 DAYS Performed at [facility], [address] [place], [place] [address]    Report Status PENDING  Incomplete     RN Pressure Injury Documentation: Pressure Injury 03/18/19 Sacrum Circumferential Stage IV - Full thickness tissue loss with exposed bone, tendon or muscle. wound has evolved into stage 4 pressure injury when assessed on 9/21 (Active)  03/18/19 1930  Location: Sacrum  Location Orientation: Circumferential  Staging: Stage IV - Full thickness tissue loss with exposed bone, tendon or muscle.  Wound Description (Comments): wound has evolved into stage 4 pressure injury when assessed on 9/21  Present on Admission: Yes     Pressure Injury 04/23/19 Scrotum Left Stage II -  Partial thickness loss of dermis presenting as a shallow open  ulcer with a red, pink wound bed without slough. (Active)  04/23/19 1451  Location: Scrotum  Location Orientation: Left  Staging: Stage II -  Partial thickness loss of dermis presenting as a shallow open ulcer with a red, pink wound bed without slough.  Wound Description (Comments):   Present on Admission:      Pressure Injury 04/25/19 Heel Right Stage II -  Partial thickness loss of dermis presenting as a shallow open ulcer with a red, pink wound bed without slough. skin was black and red on assessment (Active)  04/25/19 1711  Location: Heel  Location Orientation: Right  Staging: Stage II -  Partial thickness loss of dermis presenting as a shallow open ulcer with a red, pink wound bed without slough.  Wound Description (Comments): skin was black and red on assessment  Present on Admission: Yes       Radiology Studies: No results found.      Scheduled Meds: . aspirin EC  81 mg Oral Daily  . atorvastatin  80 mg Oral Daily  . Chlorhexidine Gluconate Cloth  6 each Topical Q0600  . feeding supplement (NEPRO CARB STEADY)  237 mL Oral BID BM  . finasteride  5 mg Oral Daily  . furosemide  80 mg Intravenous BID  . insulin aspart  0-9 Units Subcutaneous TID WC  . insulin detemir  9 Units Subcutaneous Daily  . lactobacillus  1 g Oral TID WC  . [START ON 06/22/2019] midodrine  15 mg Oral TID  . multivitamin-lutein   Oral Daily  . niacin  1,000 mg Oral Daily  . pantoprazole  40 mg Oral Daily  . potassium chloride  40 mEq Oral Daily  . pseudoephedrine  60 mg Oral Q6H  . rivaroxaban  20 mg Oral Daily  . sodium chloride flush  10-40 mL Intracatheter Q12H  . sodium chloride flush  3 mL Intravenous Q12H  . tamsulosin  0.4 mg Oral QPC breakfast   Continuous Infusions: .  sodium chloride    . cefTAZidime (FORTAZ)  IV Stopped (06/21/19 1402)     LOS: 9 days    Time spent: 12 min     Ivor Costa, DO [facility] PAGER is on [place]  If 7PM-7AM, please contact  night-coverage [email or web address] Password [facility] 06/21/2019, 7:16 PM

## 2019-06-21 NOTE — Consult Note (Signed)
[place] for Electrolyte Monitoring and Replacement   Colton Lamb is a 52 YOM with a hx of CHF, CAD, HTN, dyslipidemia, and Crohn's Disease who presented to the ED with acute onset of worsening dyspnea associated with dry cough and wheezing. He was placed on BiPAP and admitted to stepdown for further evaluation.  Pharmacy has been consulted for electrolyte monitoring and replacement.  Patient is receiving 80 mg IV Lasix q12h. He is on a carb modified diet and also getting NEPRO CARB STEADY 247m BID between meals. He is also receiving potassium chloride 419m PO daily inpatient (increased from 1064mPTA dose)  Recent Labs: Potassium (mmol/L)  Date Value  06/21/2019 4.2  11/22/2013 3.7   Magnesium (mg/dL)  Date Value  06/21/2019 2.2  11/22/2013 1.8   Calcium (mg/dL)  Date Value  06/21/2019 8.5 (L)   Calcium, Total (mg/dL)  Date Value  11/22/2013 9.0   Albumin (g/dL)  Date Value  06/12/2019 2.9 (L)  04/18/2018 4.3   Phosphorus (mg/dL)  Date Value  04/21/2019 4.8 (H)   Sodium (mmol/L)  Date Value  06/21/2019 141  05/09/2016 141  11/22/2013 133 (L)   Corrected Calcium: 9.9 mg/dL  Assessment/Plan: - K 4.2 - somewhat stable, received last dose of lasix 47m82m this morning. Will continue with scheduled KCl 40mE65m daily for today and reassess whether the order needs to be changed to PTA dose of 10mEq59m No other electrolytes warranting replacement at this time. - Will check BMP with AM labs and adjust as necessary.  Goals of Therapy:  - K ~ 4 - Mg ~ 2 - Other electrolytes WNL   Thank you for allowing pharmacy to be a part of this patient's care.   Khole Arterburn Pearla DubonnetmD Clinical Pharmacist 06/21/2019 10:28 AM

## 2019-06-21 NOTE — Progress Notes (Addendum)
Dr Blaine Hamper made aware that pts BP has still been running low, lasix ordered IV, per Dr Blaine Hamper hold lasix

## 2019-06-22 ENCOUNTER — Inpatient Hospital Stay: Payer: Medicare Other

## 2019-06-22 DIAGNOSIS — D508 Other iron deficiency anemias: Secondary | ICD-10-CM | POA: Diagnosis not present

## 2019-06-22 DIAGNOSIS — I2510 Atherosclerotic heart disease of native coronary artery without angina pectoris: Secondary | ICD-10-CM | POA: Diagnosis not present

## 2019-06-22 DIAGNOSIS — Z452 Encounter for adjustment and management of vascular access device: Secondary | ICD-10-CM

## 2019-06-22 DIAGNOSIS — I5043 Acute on chronic combined systolic (congestive) and diastolic (congestive) heart failure: Secondary | ICD-10-CM | POA: Diagnosis not present

## 2019-06-22 DIAGNOSIS — I4811 Longstanding persistent atrial fibrillation: Secondary | ICD-10-CM | POA: Diagnosis not present

## 2019-06-22 LAB — GLUCOSE, CAPILLARY
Glucose-Capillary: 111 mg/dL — ABNORMAL HIGH (ref 70–99)
Glucose-Capillary: 123 mg/dL — ABNORMAL HIGH (ref 70–99)
Glucose-Capillary: 136 mg/dL — ABNORMAL HIGH (ref 70–99)
Glucose-Capillary: 109 mg/dL — ABNORMAL HIGH (ref 70–99)

## 2019-06-22 LAB — BASIC METABOLIC PANEL
Anion gap: 12 (ref 5–15)
BUN: 29 mg/dL — ABNORMAL HIGH (ref 8–23)
CO2: 35 mmol/L — ABNORMAL HIGH (ref 22–32)
Calcium: 8.6 mg/dL — ABNORMAL LOW (ref 8.9–10.3)
Chloride: 93 mmol/L — ABNORMAL LOW (ref 98–111)
Creatinine, Ser: 1.07 mg/dL (ref 0.61–1.24)
GFR calc Af Amer: >60 mL/min (ref >60)
GFR calc non Af Amer: >60 mL/min (ref >60)
Glucose, Bld: 129 mg/dL — ABNORMAL HIGH (ref 70–99)
Potassium: 4.1 mmol/L (ref 3.5–5.1)
Sodium: 140 mmol/L (ref 135–145)

## 2019-06-22 LAB — CBC WITH DIFFERENTIAL/PLATELET
Abs Immature Granulocytes: 0.06 10*3/uL (ref 0.00–0.07)
Basophils Absolute: 0.1 10*3/uL (ref 0.0–0.1)
Basophils Relative: 1 %
Eosinophils Absolute: 0.6 10*3/uL — ABNORMAL HIGH (ref 0.0–0.5)
Eosinophils Relative: 5 %
HCT: 24.8 % — ABNORMAL LOW (ref 39.0–52.0)
Hemoglobin: 7.6 g/dL — ABNORMAL LOW (ref 13.0–17.0)
Immature Granulocytes: 1 %
Lymphocytes Relative: 12 %
Lymphs Abs: 1.3 10*3/uL (ref 0.7–4.0)
MCH: 24.4 pg — ABNORMAL LOW (ref 26.0–34.0)
MCHC: 30.6 g/dL (ref 30.0–36.0)
MCV: 79.7 fL — ABNORMAL LOW (ref 80.0–100.0)
Monocytes Absolute: 0.7 10*3/uL (ref 0.1–1.0)
Monocytes Relative: 6 %
Neutro Abs: 8.3 10*3/uL — ABNORMAL HIGH (ref 1.7–7.7)
Neutrophils Relative %: 75 %
Platelets: 317 10*3/uL (ref 150–400)
RBC: 3.11 MIL/uL — ABNORMAL LOW (ref 4.22–5.81)
RDW: 17 % — ABNORMAL HIGH (ref 11.5–15.5)
WBC: 11.1 10*3/uL — ABNORMAL HIGH (ref 4.0–10.5)
nRBC: 0 % (ref 0.0–0.2)

## 2019-06-22 LAB — MAGNESIUM: Magnesium: 2 mg/dL (ref 1.7–2.4)

## 2019-06-22 LAB — PHOSPHORUS: Phosphorus: 3.7 mg/dL (ref 2.5–4.6)

## 2019-06-22 NOTE — Progress Notes (Addendum)
PROGRESS NOTE    Colton Lamb  ASN:[id number] DOB: [birth year] DOA: 06/12/2019 PCP: [name], MD    Brief Narrative:  Colton Lamb  is a 68 y.o. obese Caucasian male with a known history of systolic CHF, coronary artery disease hypertension dyslipidemia and Crohn's disease, presented to the emergency room with acute onset of worsening dyspnea with associated dry cough and wheezing for the last 24 hours without fever or chills.  He he admitted to orthopnea and paroxysmal nocturnal dyspnea as well as dyspnea on exertion with lower extremity edema.  No fever or chills.  No nausea or vomiting.  No dysuria, oliguria or hematuria or flank pain.  No recent exposure to COVID-19.  He was tested negative about a week ago.  Upon presentation to the emergency room, his heart rate was 102 and respiratory to 34 with pulse oximetry of 85% on CPAP and later on 100% on BiPAP.  His BNP was 731 and high-sensitivity troponin I of 34.  CBC showed leukocytosis and anemia with neutrophilia and CMP showed a CO2 of 42 with a BUN of 24 and creatinine 0.98 alk phos of 160 with otherwise normal LFTs except for albumin 2.9.  Venous blood gas with a pH 7.38 and bicarbonate of 50.9.  COVID-19 test is currently pending.  Portable chest ray showed vascular congestion and small pleural effusions with bibasal atelectasis that are new compared to prior exam.  His AICD was in place.  EKG showed atrial fibrillation with a rapid rate of 110 with left intravesicular block and prolonged QT interval with QTC of 568 MS.  In ED, the patient was given 40 mg of IV Lasix.  He will be admitted to stepdown unit for further evaluation and management.  Interim History: -11/18: bp still soft. -still on 2L O2, stat 98% -11/20: Bp is soft 89/ 65 - 11/20: card increased midodrine dose from 10 to 15 mg tid due to soft blood pressure; also added pseudoephedrine 60 mg every 6 hours - 11/21:BP is better, 97/67   Subjective: Pt dose  not have CP or cough.  Has mild SOB. No fever or chills. No nausea, vomiting, AP or diarreha.  Has leg edema.   Assessment & Plan:   Principal Problem:   Acute on chronic combined systolic and diastolic CHF (congestive heart failure) (HCC) Active Problems:   CAD S/P percutaneous coronary angioplasty - multiple PCIs   Atrial fibrillation ([place])   CROHN'S DISEASE-LARGE & SMALL INTESTINE   ICD (implantable cardioverter-defibrillator) in place   Diabetes mellitus type 2, uncontrolled, with complications ([place])   Acute respiratory failure (HCC)   Anemia   Stage IV pressure ulcer of sacral region ([place])   Hypotension   Normocytic anemia  1. Acute on chronic systolic CHF with subsequent acute hypoxic and likely hypercarbic respiratory failure requiring BiPAP.weaned offbipap. Card was consulted. Appreciate cards consult, they continue to follow. Diuresis is limited by hypotension. 11/21 Card increased midodrine dose from 10 to 15 mg tid due to soft blood pressure; also added pseudoephedrine 60 mg every 6 hours. His Bp is better today, 97/67 in AM.   -Receivedalbumin 25g q6 x 4 doses11/12, repeat 11/1525g x 4 - furosemide to 80 mg IV twice daily  - Defer adding beta-blocker and ACE inhibitor/ARB in the setting of hypotension.   Persistent atrial fibrillation: ventricular rate reasonable in spite of discontinuation of beta-blocker. -Continue rivaroxaban, though low threshold for stopping in the setting of anemia.  Diabetes mellitus type 2, uncontrolled, with  complications; - c/wsliding scale insulin,  -checkingblood glucose AC at bedtime, -diabetic diet  Dyslipidemia. -Lipitor 80 mg daily  BPH. -continue Proscar and Flomax.  Stage 4 sacrum decubiti POA; -wound care consult appreciated, continue with Aquacel, Kerlix, ABD pad and change daily, agree with repositioning every 2 hours. WBC improved 12.2 --> 9.9 -->10.7 -->11.7.  -on Fortaz -ordered Bx on 11/18 --> no  grow so far -wil get Ct-pelvis to evaluate sacral ulcer. If has osteomyelitis, will consult surgeon  Normocytic anemia: hgn slowly trending down, 7.7 now , Iron 29, ferritin 95, not completely consistent iron deficiency. -check FOBT  CBC Latest Ref Rng & Units 06/22/2019 06/21/2019 06/20/2019  WBC 4.0 - 10.5 K/uL 11.1(H) 10.6(H) 9.9  Hemoglobin 13.0 - 17.0 g/dL 7.6(L) 7.7(L) 7.7(L)  Hematocrit 39.0 - 52.0 % 24.8(L) 26.1(L) 25.1(L)  Platelets 150 - 400 K/uL 317 330 301      DVT prophylaxis: on Xarelto Code Status: full code Family Communication: sister in law by phone Disposition Plan: still on 1L O2, still has soft Bp. Still has massive thigh edema and leg edeam. SM/CM is working on finding SNF bed   Consultants:   card  Procedures:    Antimicrobials:  Anti-infectives (From admission, onward)   Start     Dose/Rate Route Frequency Ordered Stop   06/17/19 0830  cefTAZidime (FORTAZ) 1 g in sodium chloride 0.9 % 100 mL IVPB     1 g 200 mL/hr over 30 Minutes Intravenous Every 8 hours 06/17/19 0818           Objective: Vitals:   06/21/19 0850 06/21/19 1706 06/21/19 2055 06/22/19 0816  BP: 103/66 1_0  Pulse: 88 95 96 98  Resp: _1 (!) 24  Temp: (!) 97.5 F (36.4 C) 97.8 F (36.6 C) 97.7 F (36.5 C) (!) 97.5 F (36.4 C)  TempSrc: Oral  Oral Oral  SpO2: 99% 100% 100% 99%  Weight:      Height:        Intake/Output Summary (Last 24 hours) at 06/22/2019 1124 Last data filed at 06/22/2019 0950 Gross per 24 hour  Intake 965.41 ml  Output 2500 ml  Net -1534.59 ml   Filed Weights   06/18/19 0437 06/20/19 0500 06/21/19 0600  Weight: 120.6 kg 114.4 kg 120.6 kg    Examination:  Physical Exam:  General: Not in acute distress HEENT: PERRL, EOMI, no scleral icterus, No JVD or bruit Cardiac: S1/S2, RRR, No murmurs, gallops or rubs Pulm: No rales, wheezing, rhonchi or rubs. Abd: Soft, nondistended, nontender, no rebound pain, no organomegaly,  BS present Ext: has 1+ leg edema bilaterally. 2+ pitting thing edema. 1+DP/PT pulse bilaterally Musculoskeletal: No joint deformities, erythema, or stiffness, ROM full Skin: No rashes.  Neuro: Alert and oriented X3, cranial nerves II-XII grossly intact, moves all extremeties normally Psych: Patient is not psychotic, no suicidal or hemocidal ideation.    Data Reviewed: I have personally reviewed following labs and imaging studies  CBC: Recent Labs  Lab 06/18/19 0532 06/19/19 0521 06/20/19 0535 06/21/19 0521 06/22/19 0437  WBC 12.5* 12.2* 9.9 10.6* 11.1*  NEUTROABS 10.0* 9.9* 7.3 8.1* 8.3*  HGB 8.0* 7.9* 7.7* 7.7* 7.6*  HCT 26.4* 26.4* 25.1* 26.1* 24.8*  MCV 80.7 83.0 80.4 83.4 79.7*  PLT 307 319 301 330 993   Basic Metabolic Panel: Recent Labs  Lab 06/18/19 0532 06/19/19 0521 06/20/19 0535 06/21/19 0521 06/22/19 0437  NA 137 139 141 141 140  K 3.6 3.4* 3.6 4.2  4.1  CL 88* 83* 93* 94* 93*  CO2 38* 40* 38* 35* 35*  GLUCOSE 109* 107* 120* 128* 129*  BUN 26* 26* 23 26* 29*  CREATININE 1.03 0.89 1.03 0.88 1.07  CALCIUM 8.8* 8.9 8.7* 8.5* 8.6*  MG 1.9 2.1 1.9 2.2 2.0  PHOS  --   --   --   --  3.7   GFR: Estimated Creatinine Clearance: 88.6 mL/min (by C-G formula based on SCr of 1.07 mg/dL). Liver Function Tests: No results for input(s): AST, ALT, ALKPHOS, BILITOT, PROT, ALBUMIN in the last 168 hours. No results for input(s): LIPASE, AMYLASE in the last 168 hours. No results for input(s): AMMONIA in the last 168 hours. Coagulation Profile: No results for input(s): INR, PROTIME in the last 168 hours. Cardiac Enzymes: No results for input(s): CKTOTAL, CKMB, CKMBINDEX, TROPONINI in the last 168 hours. BNP (last 3 results) No results for input(s): PROBNP in the last 8760 hours. HbA1C: No results for input(s): HGBA1C in the last 72 hours. CBG: Recent Labs  Lab 06/21/19 0755 06/21/19 1317 06/21/19 1648 06/21/19 2104 06/22/19 0817  GLUCAP 113* 137* 142* 160* 111*    Lipid Profile: No results for input(s): CHOL, HDL, LDLCALC, TRIG, CHOLHDL, LDLDIRECT in the last 72 hours. Thyroid Function Tests: No results for input(s): TSH, T4TOTAL, FREET4, T3FREE, THYROIDAB in the last 72 hours. Anemia Panel: Recent Labs    06/21/19 0521  FERRITIN 95  TIBC 247*  IRON 29*   Sepsis Labs: No results for input(s): PROCALCITON, LATICACIDVEN in the last 168 hours.  Recent Results (from the past 240 hour(s))  MRSA PCR Screening     Status: None   Collection Time: 06/12/19 11:00 PM   Specimen: Nasopharyngeal  Result Value Ref Range Status   MRSA by PCR NEGATIVE NEGATIVE Final    Comment:        The GeneXpert MRSA Assay (FDA approved for NASAL specimens only), is one component of a comprehensive MRSA colonization surveillance program. It is not intended to diagnose MRSA infection nor to guide or monitor treatment for MRSA infections. Performed at [facility], [address] [place], [place] [address]   CULTURE, BLOOD (ROUTINE X 2) w Reflex to ID Panel     Status: None (Preliminary result)   Collection Time: 06/19/19  8:15 AM   Specimen: BLOOD  Result Value Ref Range Status   Specimen Description BLOOD BLOOD LEFT HAND  Final   Special Requests   Final    BOTTLES DRAWN AEROBIC AND ANAEROBIC Blood Culture results may not be optimal due to an excessive volume of blood received in culture bottles   Culture   Final    NO GROWTH 3 DAYS Performed at [facility], [address] [place], [place] [address]    Report Status PENDING  Incomplete  CULTURE, BLOOD (ROUTINE X 2) w Reflex to ID Panel     Status: None (Preliminary result)   Collection Time: 06/19/19  9:55 AM   Specimen: BLOOD  Result Value Ref Range Status   Specimen Description BLOOD BLOOD LEFT HAND  Final   Special Requests   Final    BOTTLES DRAWN AEROBIC AND ANAEROBIC Blood Culture results may not be optimal due to an excessive volume of blood received in culture bottles    Culture   Final    NO GROWTH 3 DAYS Performed at [facility], [address] [place], [place] [address]    Report Status PENDING  Incomplete  RN Pressure Injury Documentation: Pressure Injury 03/18/19 Sacrum Circumferential Stage IV - Full thickness tissue loss with exposed bone, tendon or muscle. wound has evolved into stage 4 pressure injury when assessed on 9/21 (Active)  03/18/19 1930  Location: Sacrum  Location Orientation: Circumferential  Staging: Stage IV - Full thickness tissue loss with exposed bone, tendon or muscle.  Wound Description (Comments): wound has evolved into stage 4 pressure injury when assessed on 9/21  Present on Admission: Yes     Pressure Injury 04/23/19 Scrotum Left Stage II -  Partial thickness loss of dermis presenting as a shallow open ulcer with a red, pink wound bed without slough. (Active)  04/23/19 1451  Location: Scrotum  Location Orientation: Left  Staging: Stage II -  Partial thickness loss of dermis presenting as a shallow open ulcer with a red, pink wound bed without slough.  Wound Description (Comments):   Present on Admission:      Pressure Injury 04/25/19 Heel Right Stage II -  Partial thickness loss of dermis presenting as a shallow open ulcer with a red, pink wound bed without slough. skin was black and red on assessment (Active)  04/25/19 1711  Location: Heel  Location Orientation: Right  Staging: Stage II -  Partial thickness loss of dermis presenting as a shallow open ulcer with a red, pink wound bed without slough.  Wound Description (Comments): skin was black and red on assessment  Present on Admission: Yes       Radiology Studies: No results found.      Scheduled Meds: . aspirin EC  81 mg Oral Daily  . atorvastatin  80 mg Oral Daily  . Chlorhexidine Gluconate Cloth  6 each Topical Q0600  . feeding supplement (NEPRO CARB STEADY)  237 mL Oral BID BM  . finasteride  5 mg Oral Daily  . furosemide  80 mg  Intravenous BID  . insulin aspart  0-9 Units Subcutaneous TID WC  . insulin detemir  9 Units Subcutaneous Daily  . lactobacillus  1 g Oral TID WC  . midodrine  15 mg Oral TID  . multivitamin-lutein   Oral Daily  . niacin  1,000 mg Oral Daily  . pantoprazole  40 mg Oral Daily  . potassium chloride  40 mEq Oral Daily  . pseudoephedrine  60 mg Oral Q6H  . rivaroxaban  20 mg Oral Daily  . sodium chloride flush  10-40 mL Intracatheter Q12H  . sodium chloride flush  3 mL Intravenous Q12H  . tamsulosin  0.4 mg Oral QPC breakfast   Continuous Infusions: . sodium chloride    . cefTAZidime (FORTAZ)  IV Stopped (06/22/19 0615)     LOS: 10 days    Time spent: 41 min     Ivor Costa, DO [facility] PAGER is on [place]  If 7PM-7AM, please contact night-coverage [email or web address] Password [facility] 06/22/2019, 11:24 AM

## 2019-06-22 NOTE — Progress Notes (Signed)
Progress Note  Patient Name: Colton Lamb Date of Encounter: 06/22/2019  Primary Cardiologist: Ida Rogue, MD  Subjective   Sitting up eating lunch today Denies any dizziness,  Good urine output yesterday 1.7 L negative Reports it has been over 1 month since he has been sitting in a chair Tried to get up this morning, " did not go well" Legs are very weak  Inpatient Medications    Scheduled Meds: . aspirin EC  81 mg Oral Daily  . atorvastatin  80 mg Oral Daily  . Chlorhexidine Gluconate Cloth  6 each Topical Q0600  . feeding supplement (NEPRO CARB STEADY)  237 mL Oral BID BM  . finasteride  5 mg Oral Daily  . furosemide  80 mg Intravenous BID  . insulin aspart  0-9 Units Subcutaneous TID WC  . insulin detemir  9 Units Subcutaneous Daily  . lactobacillus  1 g Oral TID WC  . midodrine  15 mg Oral TID  . multivitamin-lutein   Oral Daily  . niacin  1,000 mg Oral Daily  . pantoprazole  40 mg Oral Daily  . potassium chloride  40 mEq Oral Daily  . pseudoephedrine  60 mg Oral Q6H  . rivaroxaban  20 mg Oral Daily  . sodium chloride flush  10-40 mL Intracatheter Q12H  . sodium chloride flush  3 mL Intravenous Q12H  . tamsulosin  0.4 mg Oral QPC breakfast   Continuous Infusions: . sodium chloride    . cefTAZidime (FORTAZ)  IV Stopped (06/22/19 0615)   PRN Meds: sodium chloride, acetaminophen, albuterol, ALPRAZolam, guaiFENesin-dextromethorphan, ondansetron (ZOFRAN) IV, sodium chloride flush, sodium chloride flush, zolpidem   Vital Signs    Vitals:   06/21/19 0850 06/21/19 1706 06/21/19 2055 06/22/19 0816  BP: 103/66 107/72 94/67 97/67   Pulse: 88 95 96 98  Resp: 19 17 18  (!) 24  Temp: (!) 97.5 F (36.4 C) 97.8 F (36.6 C) 97.7 F (36.5 C) (!) 97.5 F (36.4 C)  TempSrc: Oral  Oral Oral  SpO2: 99% 100% 100% 99%  Weight:      Height:        Intake/Output Summary (Last 24 hours) at 06/22/2019 1341 Last data filed at 06/22/2019 0950 Gross per 24 hour   Intake 965.41 ml  Output 2500 ml  Net -1534.59 ml   Filed Weights   06/18/19 0437 06/20/19 0500 06/21/19 0600  Weight: 120.6 kg 114.4 kg 120.6 kg    Physical Exam   Constitutional:  oriented to person, place, and time. No distress.  HENT:  Head: Grossly normal Eyes:  no discharge. No scleral icterus.  Neck: Unable to estimate JVD, no carotid bruits  Cardiovascular: Irregularly irregular no murmurs appreciated 1-2+ pitting edema thighs, buttock, lower flanks Pulmonary/Chest: Clear with scattered Rales at the bases Abdominal: Soft.  no distension.  no tenderness.  Musculoskeletal: Normal range of motion Neurological:  normal muscle tone. Coordination normal. No atrophy Skin: Skin warm and dry Psychiatric: normal affect, pleasant   Labs    Chemistry Recent Labs  Lab 06/20/19 0535 06/21/19 0521 06/22/19 0437  NA 141 141 140  K 3.6 4.2 4.1  CL 93* 94* 93*  CO2 38* 35* 35*  GLUCOSE 120* 128* 129*  BUN 23 26* 29*  CREATININE 1.03 0.88 1.07  CALCIUM 8.7* 8.5* 8.6*  GFRNONAA >60 >60 >60  GFRAA >60 >60 >60  ANIONGAP 10 12 12      Hematology Recent Labs  Lab 06/20/19 0535 06/21/19 0521 06/22/19 0437  WBC  9.9 10.6* 11.1*  RBC 3.12* 3.13* 3.11*  HGB 7.7* 7.7* 7.6*  HCT 25.1* 26.1* 24.8*  MCV 80.4 83.4 79.7*  MCH 24.7* 24.6* 24.4*  MCHC 30.7 29.5* 30.6  RDW 17.2* 17.0* 17.0*  PLT 301 330 317    Cardiac Enzymes  Recent Labs  Lab 06/12/19 0100  TROPONINIHS 34*    Radiology    No results found.  Telemetry    Afib, 90's beats per minute personally Reviewed  Cardiac Studies   TTE 01/2019: 1. The left ventricle has moderately reduced systolic function, with an ejection fraction of 35-40%. The cavity size was normal. Left ventricular diastolic Doppler parameters are indeterminate. Probably severe hypokinesis of mid-distal anterior,  ateroseptal and apical myocardium. 2. The right ventricle has normal systolic function. The cavity was normal. There is no  increase in right ventricular wall thickness. 3. Left atrial size was moderately dilated. 4. Very poor image quality in spite of using Definity. Valves not well visualized. Consider different diagnostic testing if indicated. 5. The aortic valve was not well visualized. Aortic valve regurgitation was not assessed by color flow Doppler.  Patient Profile     68 y.o.malewith history of CAD status post PCI to LAD, LCX, OM, ischemic cardiomyopathy last EF 35 to 40%, ICD placement 2015, persistent A. fib on Xarelto, history of a flutter status post cardioversion in 2005, status post ablation 2015 being seen due to worsening edema.   Assessment & Plan    Acute on chronic diastolic and systolic CHF Ejection fraction 35 to 40% -1.7 L negative, mild improvement in his thigh edema buttock edema flank edema, though still significant -Edema likely multifactorial: Fluid retention, anemia, problems with nutrition, depend on edema -Expressed the need to try to get him up to a chair.  May need Hoyer lift if unable to weight-bear -I have ordered thigh-high compression hose to mobilize fluid in the thighs  Persistent atrial fibrillation Rate controlled Blood pressure too low to add beta-blockers on Xarelto,   Anemia Will defer to medicine service Likely contributing to leg swelling and third spacing For guaiac positive may need to hold Xarelto  Hypotension Appears to be chronic issue this admission on midodrine 3 times daily -Poor nutrition, hypoalbuminemia, over 20 pound weight loss over the past year recent hospitalizations for bacteremia/sepsis, sacral decub wound -Currently on antibiotics --Feels well sitting up today eating lunch Medication changes yesterday, midodrine up to 15 mg daily, pseudoephedrine 60 mg every 6 hours -We will add thigh-high compression hose to mobilize fluid   Florinef not a good choice in the setting of CHF -Might need to add Northera, though we would need to  bring samples into the hospital to start this. -Low blood pressure may limit activities at rehab   Total encounter time more than 25 minutes  Greater than 50% was spent in counseling and coordination of care with the patient  Signed, Ida Rogue, MD  06/22/2019, 1:41 PM    For questions or updates, please contact   Please consult [email or web address] for contact info under Cardiology/STEMI.

## 2019-06-22 NOTE — Consult Note (Signed)
[place] for Electrolyte Monitoring and Replacement   Colton Lamb is a 34 YOM with a hx of CHF, CAD, HTN, dyslipidemia, and Crohn's Disease who presented to the ED with acute onset of worsening dyspnea associated with dry cough and wheezing. He was placed on BiPAP and admitted to stepdown for further evaluation.  Pharmacy has been consulted for electrolyte monitoring and replacement.  Patient is receiving 80 mg IV Lasix q12h. He is on a carb modified diet and also getting NEPRO CARB STEADY 272m BID between meals. He is also receiving potassium chloride 468m PO daily inpatient (increased from 1033mPTA dose)  Recent Labs: Potassium (mmol/L)  Date Value  06/22/2019 4.1  11/22/2013 3.7   Magnesium (mg/dL)  Date Value  06/22/2019 2.0  11/22/2013 1.8   Calcium (mg/dL)  Date Value  06/22/2019 8.6 (L)   Calcium, Total (mg/dL)  Date Value  11/22/2013 9.0   Albumin (g/dL)  Date Value  06/12/2019 2.9 (L)  04/18/2018 4.3   Phosphorus (mg/dL)  Date Value  06/22/2019 3.7   Sodium (mmol/L)  Date Value  06/22/2019 140  05/09/2016 141  11/22/2013 133 (L)   Corrected Calcium: 9.9 mg/dL  Assessment/Plan: - K 4.0 - received last dose of lasix 64m54m this morning. Will continue with scheduled KCl 40mE61m daily for today and reassess whether the order needs to be changed to PTA dose of 10mEq81m No other electrolytes warranting replacement at this time. - Will check BMP with AM labs and adjust as necessary.  Goals of Therapy:  - K ~ 4 - Mg ~ 2 - Other electrolytes WNL   Thank you for allowing pharmacy to be a part of this patient's care.   KatsouOlivia CanterlNorthampton Va Medical Centercal Pharmacist 06/22/2019 8:03 AM

## 2019-06-22 NOTE — Plan of Care (Signed)

## 2019-06-23 DIAGNOSIS — J9600 Acute respiratory failure, unspecified whether with hypoxia or hypercapnia: Secondary | ICD-10-CM | POA: Diagnosis not present

## 2019-06-23 DIAGNOSIS — D649 Anemia, unspecified: Secondary | ICD-10-CM | POA: Diagnosis not present

## 2019-06-23 DIAGNOSIS — L89154 Pressure ulcer of sacral region, stage 4: Secondary | ICD-10-CM | POA: Diagnosis not present

## 2019-06-23 DIAGNOSIS — I5043 Acute on chronic combined systolic (congestive) and diastolic (congestive) heart failure: Secondary | ICD-10-CM | POA: Diagnosis not present

## 2019-06-23 DIAGNOSIS — I4811 Longstanding persistent atrial fibrillation: Secondary | ICD-10-CM | POA: Diagnosis not present

## 2019-06-23 LAB — CBC WITH DIFFERENTIAL/PLATELET
Abs Immature Granulocytes: 0.04 10*3/uL (ref 0.00–0.07)
Basophils Absolute: 0.1 10*3/uL (ref 0.0–0.1)
Basophils Relative: 1 %
Eosinophils Absolute: 0.6 10*3/uL — ABNORMAL HIGH (ref 0.0–0.5)
Eosinophils Relative: 5 %
HCT: 24.9 % — ABNORMAL LOW (ref 39.0–52.0)
Hemoglobin: 7.6 g/dL — ABNORMAL LOW (ref 13.0–17.0)
Immature Granulocytes: 0 %
Lymphocytes Relative: 13 %
Lymphs Abs: 1.4 10*3/uL (ref 0.7–4.0)
MCH: 24.4 pg — ABNORMAL LOW (ref 26.0–34.0)
MCHC: 30.5 g/dL (ref 30.0–36.0)
MCV: 80.1 fL (ref 80.0–100.0)
Monocytes Absolute: 0.6 10*3/uL (ref 0.1–1.0)
Monocytes Relative: 6 %
Neutro Abs: 7.8 10*3/uL — ABNORMAL HIGH (ref 1.7–7.7)
Neutrophils Relative %: 75 %
Platelets: 316 10*3/uL (ref 150–400)
RBC: 3.11 MIL/uL — ABNORMAL LOW (ref 4.22–5.81)
RDW: 17.2 % — ABNORMAL HIGH (ref 11.5–15.5)
WBC: 10.5 10*3/uL (ref 4.0–10.5)
nRBC: 0 % (ref 0.0–0.2)

## 2019-06-23 LAB — BASIC METABOLIC PANEL
Anion gap: 12 (ref 5–15)
BUN: 27 mg/dL — ABNORMAL HIGH (ref 8–23)
CO2: 34 mmol/L — ABNORMAL HIGH (ref 22–32)
Calcium: 8.5 mg/dL — ABNORMAL LOW (ref 8.9–10.3)
Chloride: 93 mmol/L — ABNORMAL LOW (ref 98–111)
Creatinine, Ser: 1 mg/dL (ref 0.61–1.24)
GFR calc Af Amer: >60 mL/min (ref >60)
GFR calc non Af Amer: >60 mL/min (ref >60)
Glucose, Bld: 113 mg/dL — ABNORMAL HIGH (ref 70–99)
Potassium: 3.6 mmol/L (ref 3.5–5.1)
Sodium: 139 mmol/L (ref 135–145)

## 2019-06-23 LAB — MAGNESIUM: Magnesium: 2 mg/dL (ref 1.7–2.4)

## 2019-06-23 LAB — GLUCOSE, CAPILLARY: Glucose-Capillary: 118 mg/dL — ABNORMAL HIGH (ref 70–99)

## 2019-06-23 LAB — CORTISOL: Cortisol, Plasma: 11.6 ug/dL

## 2019-06-23 MED ORDER — SODIUM CHLORIDE 0.9 % IV SOLN
INTRAVENOUS | Status: DC | PRN
  Administered 2019-06-23 – 2019-07-08 (×11): 250 mL via INTRAVENOUS

## 2019-06-23 MED ORDER — FUROSEMIDE 10 MG/ML IJ SOLN
80.0000 mg | Freq: Every day | INTRAMUSCULAR | Status: DC
  Administered 2019-06-24: 80 mg via INTRAVENOUS
  Filled 2019-06-23: qty 8

## 2019-06-23 MED ORDER — SODIUM CHLORIDE 0.9 % IV SOLN
510.0000 mg | Freq: Once | INTRAVENOUS | Status: AC
  Administered 2019-06-23: 510 mg via INTRAVENOUS
  Filled 2019-06-23 (×2): qty 17

## 2019-06-23 NOTE — Progress Notes (Addendum)
PROGRESS NOTE    Colton Lamb  DJT:[id number] DOB: 1951/07/08 DOA: 06/12/2019 PCP: [name], MD    Brief Narrative:  Colton Lamb  is a 68 y.o. obese Caucasian male with a known history of systolic CHF, coronary artery disease hypertension dyslipidemia and Crohn's disease, presented to the emergency room with acute onset of worsening dyspnea with associated dry cough and wheezing for the last 24 hours without fever or chills.  He he admitted to orthopnea and paroxysmal nocturnal dyspnea as well as dyspnea on exertion with lower extremity edema.  No fever or chills.  No nausea or vomiting.  No dysuria, oliguria or hematuria or flank pain.  No recent exposure to COVID-19.  He was tested negative about a week ago.  Upon presentation to the emergency room, his heart rate was 102 and respiratory to 34 with pulse oximetry of 85% on CPAP and later on 100% on BiPAP.  His BNP was 731 and high-sensitivity troponin I of 34.  CBC showed leukocytosis and anemia with neutrophilia and CMP showed a CO2 of 42 with a BUN of 24 and creatinine 0.98 alk phos of 160 with otherwise normal LFTs except for albumin 2.9.  Venous blood gas with a pH 7.38 and bicarbonate of 50.9.  COVID-19 test is currently pending.  Portable chest ray showed vascular congestion and small pleural effusions with bibasal atelectasis that are new compared to prior exam.  His AICD was in place.  EKG showed atrial fibrillation with a rapid rate of 110 with left intravesicular block and prolonged QT interval with QTC of 568 MS.  In ED, the patient was given 40 mg of IV Lasix.  He will be admitted to stepdown unit for further evaluation and management.  Interim History: - 11/20: card increased midodrine dose from 10 to 15 mg tid due to soft blood pressure; also added pseudoephedrine 60 mg every 6 hours - 11/22: Bp is still soft, low, mental status normal.  -11/21: CT-pelvis showed: large decubitus ulcer overlying the lower sacrum  and coccyx with underlying bone resorption consistent with osteomyelitis. Suspect combination of acute on chronic osteomyelitis. No abscess.  - 11/22: due to persistent large decubitus ulcer consulted general surgeon, Dr. Dahlia Byes which is highly appreciated. Will place [facility]. - 11/22: will give 510 mg of IV Feraheme  Subjective: Pt dose not have CP or cough.  Has mild SOB. No fever or chills. No nausea, vomiting, AP or diarreha.  Has bilateral leg edema.  Assessment & Plan:   Principal Problem:   Acute on chronic combined systolic and diastolic CHF (congestive heart failure) (HCC) Active Problems:   CAD S/P percutaneous coronary angioplasty - multiple PCIs   Atrial fibrillation ([place])   CROHN'S DISEASE-LARGE & SMALL INTESTINE   ICD (implantable cardioverter-defibrillator) in place   Diabetes mellitus type 2, uncontrolled, with complications ([place])   Acute respiratory failure (HCC)   Anemia   Stage IV pressure ulcer of sacral region (HCC)   Hypotension   Normocytic anemia   PICC (peripherally inserted central catheter) flush  Acute on chronic systolic CHF with subsequent acute hypoxic and likely hypercarbic respiratory failure:Ejection fraction 35 to 40%. Weaned offbipap. Card was consulted. Appreciate cards consult, they continue to follow. Diuresis is limited by hypotension. 11/21 Card increased midodrine dose from 10 to 15 mg tid due to soft blood pressure; also added pseudoephedrine 60 mg every 6 hours. His Bp is better is still soft. In/out of - 3.1 L past 24 hours   -  Received albumin 25g q6 x 4 doses 11/12, repeat 11/15 25g x 4 °- changed to furosemide to 80 mg IV daily by card 11/22 °- Defer adding beta-blocker and ACE inhibitor/ARB in the setting of hypotension. ° °Hypotension: pt has been soft, but mental status normal. No fever. Has mild leukocytosi with WBC 10.5 on 11/22, dose not seem to have sepsis now. Likely multifactorial etiology. °-11/21 Card increased midodrine dose from 10  to 15 mg tid due to soft blood pressure; also added pseudoephedrine 60 mg every 6 hours. °- will check cortisol level °- monitoring °- may transfuse 1 unit of blood. °   °Persistent atrial fibrillation: ventricular rate reasonable in spite of discontinuation of beta-blocker. °-Continue rivaroxaban, though low threshold for stopping in the setting of anemia. °  °Diabetes mellitus type 2, uncontrolled, with complications; °- c/w sliding scale insulin,  °- checking blood glucose AC at bedtime,  °- diabetic diet °  °Dyslipidemia.    °- Lipitor 80 mg daily °  °BPH.  °- continue Proscar and Flomax. °  °Stage 4 sacrum decubiti POA;  °- wound care consult appreciated, continue with Aquacel, Kerlix, ABD pad and change daily, agree with repositioning every 2 hours.  11/21: CT-pelvis showed a large decubitus ulcer overlying the lower sacrum and coccyx with underlying bone resorption consistent with osteomyelitis. Suspect combination of acute on chronic osteomyelitis. No abscess. 11/22 consulted general surgeon, Dr. Pabon, highly appreciated. No no need for debridement, but would benefit from wound vac per Dr. Pabon ° °WBC improved 12.2 --> 9.9 -->10.7 -->11.7 -->10.5 °-on Fortaz °-ordered Bx on 11/18 --> no grow so far ° °Normocytic anemia: hgn slowly trending down, 7.7 now. Iron study result as below °-check FOBT pending °- will give 510 mg of IV Feraheme ° °CBC Latest Ref Rng & Units 06/23/2019 06/22/2019 06/21/2019  °WBC 4.0 - 10.5 K/uL 10.5 11.1(H) 10.6(H)  °Hemoglobin 13.0 - 17.0 g/dL 7.6(L) 7.6(L) 7.7(L)  °Hematocrit 39.0 - 52.0 % 24.9(L) 24.8(L) 26.1(L)  °Platelets 150 - 400 K/uL 316 317 330  ° °Iron/TIBC/Ferritin/ %Sat °   °Component Value Date/Time  ° IRON 29 (L) 06/21/2019 0521  ° TIBC 247 (L) 06/21/2019 0521  ° FERRITIN 95 06/21/2019 0521  ° IRONPCTSAT 12 (L) 06/21/2019 0521  ° °DVT prophylaxis: on Xarelto °Code Status: full code °Family Communication: not today °Disposition Plan: still on 1L O2, still has low Bp.  Still has significant leg and thigh edema. SM/CM is working on finding SNF bed ° ° °Consultants:  °· Card °· General surgeon °·  °Procedures: °·  ° °Antimicrobials: ° °Anti-infectives (From admission, onward)  ° Start     Dose/Rate Route Frequency Ordered Stop  ° 06/17/19 0830  cefTAZidime (FORTAZ) 1 g in sodium chloride 0.9 % 100 mL IVPB    ° 1 g °200 mL/hr over 30 Minutes Intravenous Every 8 hours 06/17/19 0818    °  ° ° ° ° ° °Objective: °Vitals:  ° 06/22/19 1628 06/23/19 0013 06/23/19 0500 06/23/19 0913  °BP: (!) 86/74 (!) 89/70  (!) 88/67  °Pulse: 99 (!) 101  100  °Resp: (!) 26 18  20  °Temp: 98.8 °F (37.1 °C) 97.6 °F (36.4 °C)  97.7 °F (36.5 °C)  °TempSrc:  Oral  Oral  °SpO2: 100% 100%  100%  °Weight:   118.5 kg   °Height:      ° ° °Intake/Output Summary (Last 24 hours) at 06/23/2019 1458 °Last data filed at 06/23/2019 0624 °Gross per 24 hour  °Intake 283.85 ml  °Output 3700 ml  °Net -3416.15 ml  ° °Filed Weights  ° 06/20/19 0500 06/21/19 0600   06/23/19 0500  °Weight: 114.4 kg 120.6 kg 118.5 kg  ° ° °Examination: ° °Physical Exam: ° °General: Not in acute distress °HEENT: PERRL, EOMI, no scleral icterus, No JVD or bruit °Cardiac: S1/S2, RRR, No murmurs, gallops or rubs °Pulm: No rales, wheezing, rhonchi or rubs. °Abd: Soft, nondistended, nontender, no rebound pain, no organomegaly, BS present °Ext: has 1+ leg edema bilaterally up to thigh, buttock, 1+DP/PT pulse bilaterally °Musculoskeletal: No joint deformities, erythema, or stiffness, ROM full °Skin: No rashes.  °Neuro: Alert and oriented X3, cranial nerves II-XII grossly intact, moves all extremeties normally °Psych: Patient is not psychotic, no suicidal or hemocidal ideation. ° ° ° °Data Reviewed: I have personally reviewed following labs and imaging studies ° °CBC: °Recent Labs  °Lab 06/19/19 °0521 06/20/19 °0535 06/21/19 °0521 06/22/19 °0437 06/23/19 °0529  °WBC 12.2* 9.9 10.6* 11.1* 10.5  °NEUTROABS 9.9* 7.3 8.1* 8.3* 7.8*  °HGB 7.9* 7.7* 7.7* 7.6* 7.6*    °HCT 26.4* 25.1* 26.1* 24.8* 24.9*  °MCV 83.0 80.4 83.4 79.7* 80.1  °PLT 319 301 330 317 316  ° °Basic Metabolic Panel: °Recent Labs  °Lab 06/19/19 °0521 06/20/19 °0535 06/21/19 °0521 06/22/19 °0437 06/23/19 °0529  °NA 139 141 141 140 139  °K 3.4* 3.6 4.2 4.1 3.6  °CL 83* 93* 94* 93* 93*  °CO2 40* 38* 35* 35* 34*  °GLUCOSE 107* 120* 128* 129* 113*  °BUN 26* 23 26* 29* 27*  °CREATININE 0.89 1.03 0.88 1.07 1.00  °CALCIUM 8.9 8.7* 8.5* 8.6* 8.5*  °MG 2.1 1.9 2.2 2.0 2.0  °PHOS  --   --   --  3.7  --   ° °GFR: °Estimated Creatinine Clearance: 94 mL/min (by C-G formula based on SCr of 1 mg/dL). °Liver Function Tests: °No results for input(s): AST, ALT, ALKPHOS, BILITOT, PROT, ALBUMIN in the last 168 hours. °No results for input(s): LIPASE, AMYLASE in the last 168 hours. °No results for input(s): AMMONIA in the last 168 hours. °Coagulation Profile: °No results for input(s): INR, PROTIME in the last 168 hours. °Cardiac Enzymes: °No results for input(s): CKTOTAL, CKMB, CKMBINDEX, TROPONINI in the last 168 hours. °BNP (last 3 results) °No results for input(s): PROBNP in the last 8760 hours. °HbA1C: °No results for input(s): HGBA1C in the last 72 hours. °CBG: °Recent Labs  °Lab 06/22/19 °0817 06/22/19 °1157 06/22/19 °1626 06/22/19 °2159 06/23/19 °1143  °GLUCAP 111* 123* 136* 109* 118*  ° °Lipid Profile: °No results for input(s): CHOL, HDL, LDLCALC, TRIG, CHOLHDL, LDLDIRECT in the last 72 hours. °Thyroid Function Tests: °No results for input(s): TSH, T4TOTAL, FREET4, T3FREE, THYROIDAB in the last 72 hours. °Anemia Panel: °Recent Labs  °  06/21/19 °0521  °FERRITIN 95  °TIBC 247*  °IRON 29*  ° °Sepsis Labs: °No results for input(s): PROCALCITON, LATICACIDVEN in the last 168 hours. ° °Recent Results (from the past 240 hour(s))  °CULTURE, BLOOD (ROUTINE X 2) w Reflex to ID Panel     Status: None (Preliminary result)  ° Collection Time: 06/19/19  8:15 AM  ° Specimen: BLOOD  °Result Value Ref Range Status  ° Specimen Description  BLOOD BLOOD LEFT HAND  Final  ° Special Requests   Final  °  BOTTLES DRAWN AEROBIC AND ANAEROBIC Blood Culture results may not be optimal due to an excessive volume of blood received in culture bottles  ° Culture   Final  °  NO GROWTH 4 DAYS °Performed at [facility], [address] [place], [place] [address] °  ° Report Status PENDING    Incomplete  °CULTURE, BLOOD (ROUTINE X 2) w Reflex to ID Panel     Status: None (Preliminary result)  ° Collection Time: 06/19/19  9:55 AM  ° Specimen: BLOOD LEFT HAND  °Result Value Ref Range Status  ° Specimen Description   Final  °  BLOOD LEFT HAND °Performed at [facility], [address] [place], [place] [address] °  ° Special Requests   Final  °  BOTTLES DRAWN AEROBIC AND ANAEROBIC Blood Culture results may not be optimal due to an excessive volume of blood received in culture bottles  ° Culture   Final  °  NO GROWTH 4 DAYS °Performed at [facility], [address] [place], [place] [address] °  ° Report Status PENDING  Incomplete  °  ° °RN Pressure Injury Documentation: °Pressure Injury 03/18/19 Sacrum Circumferential Stage IV - Full thickness tissue loss with exposed bone, tendon or muscle. wound has evolved into stage 4 pressure injury when assessed on 9/21 (Active)  °03/18/19 1930  °Location: Sacrum  °Location Orientation: Circumferential  °Staging: Stage IV - Full thickness tissue loss with exposed bone, tendon or muscle.  °Wound Description (Comments): wound has evolved into stage 4 pressure injury when assessed on 9/21  °Present on Admission: Yes  °   °Pressure Injury 04/23/19 Scrotum Left Stage II -  Partial thickness loss of dermis presenting as a shallow open ulcer with a red, pink wound bed without slough. (Active)  °04/23/19 1451  °Location: Scrotum  °Location Orientation: Left  °Staging: Stage II -  Partial thickness loss of dermis presenting as a shallow open ulcer with a red, pink wound bed without slough.  °Wound Description  (Comments):   °Present on Admission:   °   °Pressure Injury 04/25/19 Heel Right Stage II -  Partial thickness loss of dermis presenting as a shallow open ulcer with a red, pink wound bed without slough. skin was black and red on assessment (Active)  °04/25/19 1711  °Location: Heel  °Location Orientation: Right  °Staging: Stage II -  Partial thickness loss of dermis presenting as a shallow open ulcer with a red, pink wound bed without slough.  °Wound Description (Comments): skin was black and red on assessment  °Present on Admission: Yes  ° ° ° ° ° °Radiology Studies: °Ct Pelvis Wo Contrast ° °Result Date: 06/22/2019 °CLINICAL DATA:  Pressure ulcer.  Ulcer present since August. EXAM: CT PELVIS WITHOUT CONTRAST TECHNIQUE: Multidetector CT imaging of the pelvis was performed following the standard protocol without intravenous contrast. COMPARISON:  None. FINDINGS: Musculoskeletal/soft tissues: There is a large decubitus ulcer centered over the lower sacrum and coccyx, measuring 7.6 cm in length by 6.7 cm right to left. The ulcer extends to the underlying sacrum and coccyx. There is resorption of the posterior elements of the lower sacrum and portions of the coccyx consistent with osteomyelitis. Suspect a combination of chronic and acute osteomyelitis. No fractures or bone lesions.  No other evidence of osteomyelitis. Hip joints, SI joints and symphysis pubis are normally aligned. Urinary Tract: Bladder decompressed with a Foley catheter. Distal ureters normal in caliber. Bowel: Bowel anastomosis staple line noted along the inferior right colon. No bowel wall thickening or evidence of inflammation. No bowel dilation. Vascular/Lymphatic: Scattered iliac artery atherosclerotic calcifications. No aneurysm. No enlarged lymph nodes. Reproductive:  Unremarkable. Other: Small amount of ascites. Diffuse subcutaneous edema most evident in the proximal lower extremities. IMPRESSION: 1. Large decubitus ulcer overlying the lower  sacrum and coccyx with   underlying bone resorption consistent with osteomyelitis. Suspect combination of acute on chronic osteomyelitis. No abscess. 2. No other acute abnormalities. 3. Small amount of ascites and subcutaneous edema. Electronically Signed   By: Lajean Manes M.D.   On: 06/22/2019 14:13        Scheduled Meds:  aspirin EC  81 mg Oral Daily   atorvastatin  80 mg Oral Daily   Chlorhexidine Gluconate Cloth  6 each Topical Q0600   feeding supplement (NEPRO CARB STEADY)  237 mL Oral BID BM   finasteride  5 mg Oral Daily   [START ON 06/24/2019] furosemide  80 mg Intravenous Daily   insulin aspart  0-9 Units Subcutaneous TID WC   insulin detemir  9 Units Subcutaneous Daily   lactobacillus  1 g Oral TID WC   midodrine  15 mg Oral TID   multivitamin-lutein   Oral Daily   niacin  1,000 mg Oral Daily   pantoprazole  40 mg Oral Daily   potassium chloride  40 mEq Oral Daily   pseudoephedrine  60 mg Oral Q6H   rivaroxaban  20 mg Oral Daily   sodium chloride flush  10-40 mL Intracatheter Q12H   tamsulosin  0.4 mg Oral QPC breakfast   Continuous Infusions:  sodium chloride Stopped (06/23/19 0534)   cefTAZidime (FORTAZ)  IV 1 g (06/23/19 1448)   ferumoxytol       LOS: 11 days    Time spent: 30 min     Ivor Costa, DO [facility] PAGER is on AMION  If 7PM-7AM, please contact night-coverage [email or web address] Password [facility] 06/23/2019, 2:58 PM

## 2019-06-23 NOTE — Consult Note (Signed)
[facility] for Electrolyte Monitoring and Replacement   Colton Lamb is a 45 YOM with a hx of CHF, CAD, HTN, dyslipidemia, and Crohn's Disease who presented to the ED with acute onset of worsening dyspnea associated with dry cough and wheezing. He was placed on BiPAP and admitted to stepdown for further evaluation.  Pharmacy has been consulted for electrolyte monitoring and replacement.  Patient is receiving 80 mg IV Lasix q12h. He is on a carb modified diet and also getting NEPRO CARB STEADY 226m BID between meals. He is also receiving potassium chloride 418m PO daily inpatient (increased from 1029mPTA dose)  Recent Labs: Potassium (mmol/L)  Date Value  06/23/2019 3.6  11/22/2013 3.7   Magnesium (mg/dL)  Date Value  06/23/2019 2.0  11/22/2013 1.8   Calcium (mg/dL)  Date Value  06/23/2019 8.5 (L)   Calcium, Total (mg/dL)  Date Value  11/22/2013 9.0   Albumin (g/dL)  Date Value  06/12/2019 2.9 (L)  04/18/2018 4.3   Phosphorus (mg/dL)  Date Value  06/22/2019 3.7   Sodium (mmol/L)  Date Value  06/23/2019 139  05/09/2016 141  11/22/2013 133 (L)   Corrected Calcium: 9.9 mg/dL  Assessment/Plan: - K 3.6 - Will continue with scheduled KCl 95m51mO daily for today and reassess whether the order needs to be changed to PTA dose of 10mE37m- No other electrolytes warranting replacement at this time. - Will check BMP with AM labs and adjust as necessary.  Goals of Therapy:  - K ~ 4 - Mg ~ 2 - Other electrolytes WNL   Thank you for allowing pharmacy to be a part of this patient's care.   KatsoOlivia CanterCSpooner Hospital Systemical Pharmacist 06/23/2019 8:24 AM

## 2019-06-23 NOTE — Consult Note (Signed)
Patient ID: Colton Lamb, male   DOB: [birth year], 68 y.o.   MRN: [id number]  HPI Colton Lamb is a 68 y.o. male [name] a68 y.o.obese Caucasian malewith a known history of systolic CHF, coronary artery disease hypertension dyslipidemia and Crohn's disease, presented to the emergency room with acute onset of worsening dyspnea with associated dry cough and wheezing for the last 24 hours without fever or chills. He he admitted to orthopnea and paroxysmal nocturnal dyspnea as well as dyspnea on exertion with lower extremity edema. No fever or chills. No nausea or vomiting. No dysuria, oliguria or hematuria or flank pain. No recent exposure to COVID-19. He was tested negative about a week ago. He somes from nursing home and has had decubitus at least since August.  No fevers or chill,s no major changes in his decubitus. He is anticoagulated Ct scan personally reviewed showing Large decubitus ulcer ower sacrum and coccyx , no evidence of necrotizing infection.  No undrained abscess. He uis unable to walk due to severe deconditioning, he is not paraplegic an does not have a permanent neurological deficits. WBC 10 hb 7.6 PL 316,creat 1  HPI  Past Medical History:  Diagnosis Date  . Atrial flutter ([place])    a. s/p Cardioversion 11/22/13, on amiodarone and Xarelto.  . CHF (congestive heart failure) ([place])   . Chronic systolic heart failure ([place])    a. 10/2013 EF 20-25%, grade III DD, RV mildly dilated and sys fx mild/mod reduced;  b. 01/2014 Echo: EF 30-35%, gr3 DD, mod dil LA.  Marland Kitchen Coronary artery disease    a. s/p MI 2007/2015;  b. s/p prior PCI to the LAD/LCX/PDA/PL;  c. 2008: s/p Cypher DES to the OM.  Marland Kitchen Crohn's ileocolitis ([place])   . GERD (gastroesophageal reflux disease)   . Hx of adenomatous colonic polyps 11/2003  . Hyperlipidemia   . Hypertension   . Ischemic cardiomyopathy    s. 01/2014 s/p MDT [id number] [name] XT DR single lead AICD.  Marland Kitchen Obesity   . Paroxysmal atrial  fibrillation ([facility])    a. CHA2DS2VASc = 4-->xarelto/amio.  . Sleep apnea   . Syncope    a.  11/2013 in setting of volume depletion and bradycardia due to dig toxicity   . Type II diabetes mellitus ([place])     Past Surgical History:  Procedure Laterality Date  . ATRIAL FLUTTER ABLATION N/A 04/16/2014   Procedure: ATRIAL FLUTTER ABLATION;  Surgeon: Evans Lance, MD;  Location: [facility] CATH LAB;  Service: Cardiovascular;  Laterality: N/A;  . CARDIAC CATHETERIZATION  10/2013  . CARDIAC DEFIBRILLATOR PLACEMENT  04/16/2014   Medtronic Evira device  . CARDIAC ELECTROPHYSIOLOGY STUDY AND ABLATION  04/16/2014   atrial flutter ablation  . CARDIOVERSION N/A 03/05/2014   Procedure: CARDIOVERSION;  Surgeon: Jolaine Artist, MD;  Location: [facility] ENDOSCOPY;  Service: Cardiovascular;  Laterality: N/A;  . CATARACT EXTRACTION W/PHACO Right 01/04/2017   Procedure: CATARACT EXTRACTION PHACO AND INTRAOCULAR LENS PLACEMENT ([place])  Right Diabetic Complicated;  Surgeon: Leandrew Koyanagi, MD;  Location: [place];  Service: Ophthalmology;  Laterality: Right;  Diabetic  . CATARACT EXTRACTION W/PHACO Left 02/08/2017   Procedure: CATARACT EXTRACTION PHACO AND INTRAOCULAR LENS PLACEMENT ([place]) left diabetic;  Surgeon: Leandrew Koyanagi, MD;  Location: [place];  Service: Ophthalmology;  Laterality: Left;  Diabetic - oral meds sleep apnea  . CORONARY ANGIOPLASTY WITH STENT PLACEMENT  2007; 2008 X 2   "1+1 ~ 1"  . FOOT SURGERY Left    bone spur  .  HYDROCELE EXCISION Bilateral   . 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 IMPLANTABLE CARDIOVERTER DEFIBRILLATOR IMPLANT N/A 04/16/2014   Procedure: IMPLANTABLE CARDIOVERTER DEFIBRILLATOR IMPLANT;  Surgeon: Evans Lance, MD;  Location: [facility] CATH LAB;  Service: Cardiovascular;  Laterality: N/A;  . LEFT HEART CATHETERIZATION WITH CORONARY ANGIOGRAM N/A 11/22/2013   Procedure: LEFT HEART CATHETERIZATION WITH CORONARY ANGIOGRAM;   Surgeon: Sinclair Grooms, MD;  Location: [facility] CATH LAB;  Service: Cardiovascular;  Laterality: N/A;    Family History  Problem Relation Age of Onset  . Breast cancer Mother   . Heart disease Father   . Heart attack Father   . Colon cancer Neg Hx     Social History Social History   Tobacco Use  . Smoking status: Never Smoker  . Smokeless tobacco: Never Used  Substance Use Topics  . Alcohol use: No  . Drug use: No    Allergies  Allergen Reactions  . Iodine Other (See Comments)    Shortness of breath, swelling and hives  . Shrimp [Shellfish Allergy] Other (See Comments)    SWELLING    HIVES    SHORTNESS OF BREATH  . Tetracycline Rash    Current Facility-Administered Medications  Medication Dose Route Frequency Provider Last Rate Last Dose  . 0.9 %  sodium chloride infusion   Intravenous PRN Ivor Costa, MD   Stopped at 06/23/19 0534  . acetaminophen (TYLENOL) tablet 650 mg  650 mg Oral Q4H PRN Mansy, Jan A, MD      . albuterol (VENTOLIN HFA) 108 (90 Base) MCG/ACT inhaler 1 puff  1 puff Inhalation Q6H PRN Mansy, Jan A, MD      . ALPRAZolam Duanne Moron) tablet 0.25 mg  0.25 mg Oral BID PRN Mansy, Jan A, MD      . aspirin EC tablet 81 mg  81 mg Oral Daily Mansy, Jan A, MD   81 mg at 06/23/19 0913  . atorvastatin (LIPITOR) tablet 80 mg  80 mg Oral Daily Mansy, Jan A, MD   80 mg at 06/22/19 1733  . cefTAZidime (FORTAZ) 1 g in sodium chloride 0.9 % 100 mL IVPB  1 g Intravenous Q8H Spongberg, Audie Pinto, MD 200 mL/hr at 06/23/19 1448 1 g at 06/23/19 1448  . Chlorhexidine Gluconate Cloth 2 % PADS 6 each  6 each Topical [id number] Flora Lipps, MD   6 each at 06/23/19 0915  . feeding supplement (NEPRO CARB STEADY) liquid 237 mL  237 mL Oral BID BM Mansy, Jan A, MD 0 mL/hr at 06/22/19 2347 237 mL at 06/23/19 0907  . ferumoxytol (FERAHEME) 510 mg in sodium chloride 0.9 % 100 mL IVPB  510 mg Intravenous Once Ivor Costa, MD      . finasteride (PROSCAR) tablet 5 mg  5 mg Oral Daily Mansy, Jan A, MD    5 mg at 06/23/19 0908  . [START ON 06/24/2019] furosemide (LASIX) injection 80 mg  80 mg Intravenous Daily Gollan, Kathlene November, MD      . guaiFENesin-dextromethorphan [facility] DM) 100-10 MG/5ML syrup 10 mL  10 mL Oral Q4H PRN Mansy, Jan A, MD      . insulin aspart (novoLOG) injection 0-9 Units  0-9 Units Subcutaneous TID WC Mansy, Arvella Merles, MD   1 Units at 06/22/19 1734  . insulin detemir (LEVEMIR) injection 9 Units  9 Units Subcutaneous Daily Mansy, Arvella Merles, MD   9 Units at 06/23/19 0915  . lactobacillus (FLORANEX/LACTINEX) granules 1 g  1  g Oral TID WC Mansy, Jan A, MD   1 g at 06/22/19 1761  . midodrine (PROAMATINE) tablet 15 mg  15 mg Oral TID Minna Merritts, MD   15 mg at 06/23/19 0908  . multivitamin-lutein (OCUVITE-LUTEIN) capsule   Oral Daily Mansy, Jan A, MD   1 capsule at 06/23/19 0914  . niacin (NIASPAN) CR tablet 1,000 mg  1,000 mg Oral Daily Mansy, Jan A, MD   1,000 mg at 06/23/19 0913  . ondansetron (ZOFRAN) injection 4 mg  4 mg Intravenous Q6H PRN Mansy, Jan A, MD      . pantoprazole (PROTONIX) EC tablet 40 mg  40 mg Oral Daily Mansy, Jan A, MD   40 mg at 06/23/19 0913  . potassium chloride SA (KLOR-CON) CR tablet 40 mEq  40 mEq Oral Daily Charlett Nose, RPH   40 mEq at 06/23/19 0913  . pseudoephedrine (SUDAFED) tablet 60 mg  60 mg Oral Q6H Minna Merritts, MD   60 mg at 06/23/19 0914  . rivaroxaban (XARELTO) tablet 20 mg  20 mg Oral Daily Mansy, Jan A, MD   20 mg at 06/23/19 0914  . sodium chloride flush (NS) 0.9 % injection 10-40 mL  10-40 mL Intracatheter Q12H Camnitz, Ocie Doyne, MD   30 mL at 06/23/19 0915  . sodium chloride flush (NS) 0.9 % injection 10-40 mL  10-40 mL Intracatheter PRN Camnitz, Ocie Doyne, MD      . tamsulosin [facility]) capsule 0.4 mg  0.4 mg Oral QPC breakfast Mansy, Jan A, MD   0.4 mg at 06/23/19 0908  . zolpidem (AMBIEN) tablet 5 mg  5 mg Oral QHS PRN Mansy, Arvella Merles, MD         Review of Systems Full ROS  was asked and was negative except for the  information on the HPI  Physical Exam Blood pressure 99/74, pulse (!) 107, temperature (!) 97.5 F (36.4 C), temperature source Oral, resp. rate 18, height 6' (1.829 m), weight 118.5 kg, SpO2 99 %. CONSTITUTIONAL: Debilitated, awake and follows commands EYES: Pupils are equal, round, and reactive to light, Sclera are non-icteric. EARS, NOSE, MOUTH AND THROAT: The oropharynx is clear. The oral mucosa is pink and moist. Hearing is intact to voice. LYMPH NODES:  Lymph nodes in the neck are normal. RESPIRATORY:  Lungs are clear. There is normal respiratory effort, with equal breath sounds bilaterally, and without pathologic use of accessory muscles. CARDIOVASCULAR: Heart is iregular without murmurs, gallops, or rubs. GI: The abdomen is  soft, nontender, and nondistended. There are no palpable masses. There is no hepatosplenomegaly. There are normal bowel sounds in all quadrants. GU: Rectal deferred.   MUSCULOSKELETAL: Normal muscle strength and tone.there is pitting edema  SKIN: Sacral stage IV decubitus measuring 12x 8 cms,  Beefy red granulation tissue at the base, no active infection   NEUROLOGIC: Motor and sensation is grossly normal. Cranial nerves are grossly intact. PSYCH:  Oriented to person, place and time. Affect is normal.  Data Reviewed  I have personally reviewed the patient's imaging, laboratory findings and medical records.    Assessment/  Plan Large sacral ulcer. No need for debridement. Recommend wound care and wound vac. Increase nutritional support and off loading of pressure points. Consider ID consult for long term a/bs  plan We will be available if any furhter surgical needs arise.  Caroleen Hamman, MD FACS General Surgeon 06/23/2019, 5:05 PM

## 2019-06-23 NOTE — Progress Notes (Signed)
Progress Note  Patient Name: Colton Lamb Date of Encounter: 06/23/2019  Primary Cardiologist: Ida Rogue, MD  Subjective   Reports feeling okay, unable to get out of bed Last 2 days is -5 L net negative Reports legs feel heavy  Discussed CT scan results with him "Large decubitus ulcer overlying the lower sacrum and coccyx with underlying bone resorption consistent with osteomyelitis. Suspect combination of acute on chronic osteomyelitis."   Inpatient Medications    Scheduled Meds: . aspirin EC  81 mg Oral Daily  . atorvastatin  80 mg Oral Daily  . Chlorhexidine Gluconate Cloth  6 each Topical Q0600  . feeding supplement (NEPRO CARB STEADY)  237 mL Oral BID BM  . finasteride  5 mg Oral Daily  . furosemide  80 mg Intravenous BID  . insulin aspart  0-9 Units Subcutaneous TID WC  . insulin detemir  9 Units Subcutaneous Daily  . lactobacillus  1 g Oral TID WC  . midodrine  15 mg Oral TID  . multivitamin-lutein   Oral Daily  . niacin  1,000 mg Oral Daily  . pantoprazole  40 mg Oral Daily  . potassium chloride  40 mEq Oral Daily  . pseudoephedrine  60 mg Oral Q6H  . rivaroxaban  20 mg Oral Daily  . sodium chloride flush  10-40 mL Intracatheter Q12H  . tamsulosin  0.4 mg Oral QPC breakfast   Continuous Infusions: . sodium chloride Stopped (06/23/19 0534)  . cefTAZidime (FORTAZ)  IV 200 mL/hr at 06/23/19 0600   PRN Meds: sodium chloride, acetaminophen, albuterol, ALPRAZolam, guaiFENesin-dextromethorphan, ondansetron (ZOFRAN) IV, sodium chloride flush, zolpidem   Vital Signs    Vitals:   06/22/19 1628 06/23/19 0013 06/23/19 0500 06/23/19 0913  BP: (!) 86/74 (!) 89/70  (!) 88/67  Pulse: 99 (!) 101  100  Resp: (!) 26 18  20   Temp: 98.8 F (37.1 C) 97.6 F (36.4 C)  97.7 F (36.5 C)  TempSrc:  Oral  Oral  SpO2: 100% 100%  100%  Weight:   118.5 kg   Height:        Intake/Output Summary (Last 24 hours) at 06/23/2019 1143 Last data filed at 06/23/2019 4235  Gross per 24 hour  Intake 283.85 ml  Output 3700 ml  Net -3416.15 ml   Filed Weights   06/20/19 0500 06/21/19 0600 06/23/19 0500  Weight: 114.4 kg 120.6 kg 118.5 kg    Physical Exam   Constitutional:  oriented to person, place, and time. No distress.  HENT:  Head: Grossly normal Eyes:  no discharge. No scleral icterus.  Neck:  JVD 8 to 10+, no carotid bruits  Cardiovascular: Irregularly irregular no murmurs appreciated 2+ pitting edema thighs buttock flanks Pulmonary/Chest: Clear to auscultation bilaterally, no wheezes or rails Abdominal: Soft.  no distension.  no tenderness.  Musculoskeletal: Normal range of motion Neurological:  normal muscle tone. Coordination normal. No atrophy Skin: Skin warm and dry Psychiatric: normal affect, pleasant    Labs    Chemistry Recent Labs  Lab 06/21/19 0521 06/22/19 0437 06/23/19 0529  NA 141 140 139  K 4.2 4.1 3.6  CL 94* 93* 93*  CO2 35* 35* 34*  GLUCOSE 128* 129* 113*  BUN 26* 29* 27*  CREATININE 0.88 1.07 1.00  CALCIUM 8.5* 8.6* 8.5*  GFRNONAA >60 >60 >60  GFRAA >60 >60 >60  ANIONGAP 12 12 12      Hematology Recent Labs  Lab 06/21/19 0521 06/22/19 0437 06/23/19 0529  WBC 10.6* 11.1*  10.5  RBC 3.13* 3.11* 3.11*  HGB 7.7* 7.6* 7.6*  HCT 26.1* 24.8* 24.9*  MCV 83.4 79.7* 80.1  MCH 24.6* 24.4* 24.4*  MCHC 29.5* 30.6 30.5  RDW 17.0* 17.0* 17.2*  PLT 330 317 316    Cardiac Enzymes  Recent Labs  Lab 06/12/19 0100  TROPONINIHS 34*    Radiology    Ct Pelvis Wo Contrast  Result Date: 06/22/2019 CLINICAL DATA:  Pressure ulcer.  Ulcer present since August. EXAM: CT PELVIS WITHOUT CONTRAST TECHNIQUE: Multidetector CT imaging of the pelvis was performed following the standard protocol without intravenous contrast. COMPARISON:  None. FINDINGS: Musculoskeletal/soft tissues: There is a large decubitus ulcer centered over the lower sacrum and coccyx, measuring 7.6 cm in length by 6.7 cm right to left. The ulcer extends  to the underlying sacrum and coccyx. There is resorption of the posterior elements of the lower sacrum and portions of the coccyx consistent with osteomyelitis. Suspect a combination of chronic and acute osteomyelitis. No fractures or bone lesions.  No other evidence of osteomyelitis. Hip joints, SI joints and symphysis pubis are normally aligned. Urinary Tract: Bladder decompressed with a Foley catheter. Distal ureters normal in caliber. Bowel: Bowel anastomosis staple line noted along the inferior right colon. No bowel wall thickening or evidence of inflammation. No bowel dilation. Vascular/Lymphatic: Scattered iliac artery atherosclerotic calcifications. No aneurysm. No enlarged lymph nodes. Reproductive:  Unremarkable. Other: Small amount of ascites. Diffuse subcutaneous edema most evident in the proximal lower extremities. IMPRESSION: 1. Large decubitus ulcer overlying the lower sacrum and coccyx with underlying bone resorption consistent with osteomyelitis. Suspect combination of acute on chronic osteomyelitis. No abscess. 2. No other acute abnormalities. 3. Small amount of ascites and subcutaneous edema. Electronically Signed   By: Lajean Manes M.D.   On: 06/22/2019 14:13    Telemetry    Afib, 90's beats per minute personally Reviewed  Cardiac Studies   TTE 01/2019: 1. The left ventricle has moderately reduced systolic function, with an ejection fraction of 35-40%. The cavity size was normal. Left ventricular diastolic Doppler parameters are indeterminate. Probably severe hypokinesis of mid-distal anterior,  ateroseptal and apical myocardium. 2. The right ventricle has normal systolic function. The cavity was normal. There is no increase in right ventricular wall thickness. 3. Left atrial size was moderately dilated. 4. Very poor image quality in spite of using Definity. Valves not well visualized. Consider different diagnostic testing if indicated. 5. The aortic valve was not well  visualized. Aortic valve regurgitation was not assessed by color flow Doppler.  Patient Profile     68 y.o.malewith history of CAD status post PCI to LAD, LCX, OM, ischemic cardiomyopathy last EF 35 to 40%, ICD placement 2015, persistent A. fib on Xarelto, history of a flutter status post cardioversion in 2005, status post ablation 2015 being seen due to worsening edema.   Assessment & Plan    Acute on chronic diastolic and systolic CHF Ejection fraction 35 to 40% -1.7 L negative yesterday, additional -3 L past 24 hours total -5 L Still with significant pitting thigh buttock flank edema -Improving creatinine, stable lab work -Recommend we continue Lasix IV , will back down to once a day given low blood pressure, systolics in the 16X  Hypotension on midodrine 3 times daily, dose has been increased -Poor nutrition, hypoalbuminemia, over 20 pound weight loss over the past year likely contributing Also with sacral decub wound, osteomyelitis ----on antibiotics --midodrine  15 mg daily, pseudoephedrine 60 mg every 6 hours does  not appear to be helping much with his pressures --Yesterday we ordered thigh-high compression hose to mobilize fluid   Florinef not a good choice in the setting of CHF -Order placed for thigh-high compression hose -Flomax can also drop blood pressure   Decub ulcer Large decubitus ulcer with underlying bone resorption consistent with osteomyelitis. acute on chronic osteomyelitis. Discussed with medicine service, They will consider touching base with surgical service, wound clinic, even ID -He has general debility after several months of laying supine in bed, legs are weak  Persistent atrial fibrillation Rate controlled Blood pressure too low to add beta-blockers on Xarelto,   Anemia He may benefit from unit of blood given hypotension, heart failure   Case discussed with medicine service Complicated case with numerous medical issues detailed above   Total encounter time more than 35 minutes  Greater than 50% was spent in counseling and coordination of care with the patient  Signed, Ida Rogue, MD  06/23/2019, 11:43 AM    For questions or updates, please contact   Please consult [email or web address] for contact info under Cardiology/STEMI.

## 2019-06-24 ENCOUNTER — Ambulatory Visit: Payer: Medicare Other | Admitting: Family

## 2019-06-24 DIAGNOSIS — I5043 Acute on chronic combined systolic (congestive) and diastolic (congestive) heart failure: Secondary | ICD-10-CM | POA: Diagnosis not present

## 2019-06-24 LAB — CBC WITH DIFFERENTIAL/PLATELET
Abs Immature Granulocytes: 0.06 10*3/uL (ref 0.00–0.07)
Basophils Absolute: 0.1 10*3/uL (ref 0.0–0.1)
Basophils Relative: 1 %
Eosinophils Absolute: 0.5 10*3/uL (ref 0.0–0.5)
Eosinophils Relative: 4 %
HCT: 25.4 % — ABNORMAL LOW (ref 39.0–52.0)
Hemoglobin: 7.7 g/dL — ABNORMAL LOW (ref 13.0–17.0)
Immature Granulocytes: 1 %
Lymphocytes Relative: 8 %
Lymphs Abs: 1 10*3/uL (ref 0.7–4.0)
MCH: 24.8 pg — ABNORMAL LOW (ref 26.0–34.0)
MCHC: 30.3 g/dL (ref 30.0–36.0)
MCV: 81.7 fL (ref 80.0–100.0)
Monocytes Absolute: 0.7 10*3/uL (ref 0.1–1.0)
Monocytes Relative: 6 %
Neutro Abs: 10.5 10*3/uL — ABNORMAL HIGH (ref 1.7–7.7)
Neutrophils Relative %: 80 %
Platelets: 313 10*3/uL (ref 150–400)
RBC: 3.11 MIL/uL — ABNORMAL LOW (ref 4.22–5.81)
RDW: 17.2 % — ABNORMAL HIGH (ref 11.5–15.5)
WBC: 12.8 10*3/uL — ABNORMAL HIGH (ref 4.0–10.5)
nRBC: 0 % (ref 0.0–0.2)

## 2019-06-24 LAB — PREPARE RBC (CROSSMATCH)

## 2019-06-24 LAB — BASIC METABOLIC PANEL
Anion gap: 12 (ref 5–15)
BUN: 28 mg/dL — ABNORMAL HIGH (ref 8–23)
CO2: 31 mmol/L (ref 22–32)
Calcium: 8.6 mg/dL — ABNORMAL LOW (ref 8.9–10.3)
Chloride: 97 mmol/L — ABNORMAL LOW (ref 98–111)
Creatinine, Ser: 0.96 mg/dL (ref 0.61–1.24)
GFR calc Af Amer: >60 mL/min (ref >60)
GFR calc non Af Amer: >60 mL/min (ref >60)
Glucose, Bld: 135 mg/dL — ABNORMAL HIGH (ref 70–99)
Potassium: 3.6 mmol/L (ref 3.5–5.1)
Sodium: 140 mmol/L (ref 135–145)

## 2019-06-24 LAB — CULTURE, BLOOD (ROUTINE X 2)
Culture: NO GROWTH
Culture: NO GROWTH

## 2019-06-24 LAB — GLUCOSE, CAPILLARY
Glucose-Capillary: 99 mg/dL (ref 70–99)
Glucose-Capillary: 105 mg/dL — ABNORMAL HIGH (ref 70–99)
Glucose-Capillary: 109 mg/dL — ABNORMAL HIGH (ref 70–99)
Glucose-Capillary: 114 mg/dL — ABNORMAL HIGH (ref 70–99)

## 2019-06-24 LAB — TROPONIN I (HIGH SENSITIVITY): Troponin I (High Sensitivity): 16 ng/L (ref <18)

## 2019-06-24 LAB — MAGNESIUM: Magnesium: 2.2 mg/dL (ref 1.7–2.4)

## 2019-06-24 MED ORDER — DIGOXIN 125 MCG PO TABS
0.1250 mg | ORAL_TABLET | Freq: Every day | ORAL | Status: DC
  Administered 2019-06-24 – 2019-07-02 (×9): 0.125 mg via ORAL
  Filled 2019-06-24 (×11): qty 1

## 2019-06-24 MED ORDER — SODIUM CHLORIDE 0.9% IV SOLUTION
Freq: Once | INTRAVENOUS | Status: AC
  Administered 2019-06-24: 23:00:00 via INTRAVENOUS

## 2019-06-24 MED ORDER — FUROSEMIDE 10 MG/ML IJ SOLN
40.0000 mg | Freq: Two times a day (BID) | INTRAMUSCULAR | Status: DC
  Administered 2019-06-25 – 2019-06-27 (×4): 40 mg via INTRAVENOUS
  Filled 2019-06-24 (×4): qty 4

## 2019-06-24 NOTE — Progress Notes (Signed)
PT Cancellation Note  Patient Details Name: Colton Lamb MRN: [id number] DOB: 08-15-1950   Cancelled Treatment:    Reason Eval/Treat Not Completed: Other (comment)(PT attempted to see patient, pt informed PT wound nurse was preparing to place wound vac. PT to follow up as able.)  Lieutenant Diego PT, DPT [phone] PM,06/24/19 [phone]

## 2019-06-24 NOTE — Progress Notes (Signed)
Physical Therapy Treatment Patient Details Name: Colton Lamb MRN: [id number] DOB: 05-Apr-1951 Today's Date: 06/24/2019    History of Present Illness [place] presented to ER secondary to respiratory distress; admitted for management of acute hypoxic/hypercarbic respiratory failure (requiring BiPAP at initial presentation).  Of note, patient with large, chronic stage IV sacral decubitus ulcer.    PT Comments    Patient alert, agreeable to PT. Patient performed supine exercises mostly AROM, though did demonstrate fatigue with repetitions and began to need physical assistance to maintain form. The patient performed supine to sit with modA and 2+ for safety. The patient was able to sit EOB with supervision once balanced. Pt noted to have had a bowel movement, further mobility deferred, CNA notified. The patient would benefit from further skilled PT intervention to maximize safety, mobility, and independence.      Follow Up Recommendations  SNF     Equipment Recommendations  None recommended by PT    Recommendations for Other Services       Precautions / Restrictions Precautions Precaution Comments: *sTg4 Sacral wound Restrictions Weight Bearing Restrictions: No    Mobility  Bed Mobility Overal bed mobility: Needs Assistance Bed Mobility: Supine to Sit;Sit to Supine     Supine to sit: Mod assist;+2 for safety/equipment;HOB elevated Sit to supine: Mod assist;+2 for safety/equipment;HOB elevated   General bed mobility comments: once at EOB, is quickly able to established seated balance, trunk stability  Transfers                 General transfer comment: deferred due to [facility]  Ambulation/Gait                 Stairs             Wheelchair Mobility    Modified Rankin (Stroke Patients Only)       Balance Overall balance assessment: Needs assistance Sitting-balance support: Feet supported;Feet unsupported Sitting balance-Leahy Scale: Good                                      Cognition Arousal/Alertness: Awake/alert Behavior During Therapy: WFL for tasks assessed/performed Overall Cognitive Status: Within Functional Limits for tasks assessed                                 General Comments: following simple commands well with verbal and tactile cues, is conversational      Exercises General Exercises - Lower Extremity Ankle Circles/Pumps: AROM;Both;15 reps;Strengthening Quad Sets: AROM;Both;15 reps;Strengthening Heel Slides: AAROM;Strengthening;Both;15 reps Straight Leg Raises: AROM;Strengthening;Both;10 reps Other Exercises Other Exercises: Pt able to sit EOB with supervision for at least 1 minute    General Comments        Pertinent Vitals/Pain Pain Assessment: No/denies pain    Home Living                      Prior Function            PT Goals (current goals can now be found in the care plan section) Acute Rehab PT Goals PT Goal Formulation: With patient Time For Goal Achievement: 06/27/19 Potential to Achieve Goals: Fair Progress towards PT goals: Progressing toward goals    Frequency    Min 2X/week      PT Plan Current plan remains appropriate    Co-evaluation  AM-PAC PT "6 Clicks" Mobility   Outcome Measure  Help needed turning from your back to your side while in a flat bed without using bedrails?: A Lot Help needed moving from lying on your back to sitting on the side of a flat bed without using bedrails?: A Lot Help needed moving to and from a bed to a chair (including a wheelchair)?: Total Help needed standing up from a chair using your arms (e.g., wheelchair or bedside chair)?: Total Help needed to walk in hospital room?: Total Help needed climbing 3-5 steps with a railing? : Total 6 Click Score: 8    End of Session Equipment Utilized During Treatment: Gait belt;Oxygen(1L) Activity Tolerance: Patient tolerated treatment  well(bowel incontinence) Patient left: in bed;with call bell/phone within reach;with bed alarm set Nurse Communication: Mobility status PT Visit Diagnosis: Muscle weakness (generalized) (M62.81);Difficulty in walking, not elsewhere classified (R26.2)     Time: 1320-1340 PT Time Calculation (min) (ACUTE ONLY): 20 min  Charges:  $Therapeutic Exercise: 8-22 mins                     Lieutenant Diego PT, DPT 1:59 PM,06/24/19 [phone]

## 2019-06-24 NOTE — Consult Note (Addendum)
WOC Nurse Consult Note: Reason for Consult: Place NPWT to sacral pressure injury per Dr. Corlis Leak request. Wound type:Pressure, Stage 4 Pressure Injury POA: Yes Measurement: 6.5cm x 9.5cm x 3.5cm Wound bed: beefy red, friable, bleeding Drainage (amount, consistency, odor) serosanguinous  Periwound: intact, erythematous distal to wound due to large bowel movement just prior to [facility] placement. Patient described it as "explosive" and he was covered from waist to posterior LEs with stool. Dressing procedure/placement/frequency: Wound cleansed with copious amounts of NS as wound was contaminated by stool. Skin is patted gently dry. Periwound skin is protected with ostomy skin barrier. Three layers of overlapping drape are applied to left hip in anticipation of bridging. ONE piece of black foam used to obliterate dead space, wound and foam are covered with drape.  An opening is cut into the drape. ONE long piece of black foam is fashioned into a bridge and placed upon the protective layer of drape. This is covered with additional drape. The T.R.A.C pad is placed over end of foam near hip and attached to 162mHg continuous negative pressure. An immediate seal is achieved. Patient tolerated procedure well. Tubing is labeled with the # and type of wound contact layers.  Total pieces of foam used = 2  Next NPWT dressing change is due on Wednesday, 11/25.  WWalesnursing team will follow, and will remain available to this patient, the nursing and medical teams.    Thanks, LMaudie Flakes MSN, RN, GLevering CArther Abbott Pager# [phone]

## 2019-06-24 NOTE — Consult Note (Signed)
[facility] for Electrolyte Monitoring and Replacement   Quantavis Obryant is a 37 YOM with a hx of CHF, CAD, HTN, dyslipidemia, and Crohn's Disease who presented to the ED with acute onset of worsening dyspnea associated with dry cough and wheezing. He was placed on BiPAP and admitted to stepdown for further evaluation.  Pharmacy has been consulted for electrolyte monitoring and replacement.  Patient is receiving 80 mg IV Lasix q12h. He is on a carb modified diet and also getting NEPRO CARB STEADY 266m BID between meals. He is also receiving potassium chloride 456m PO daily inpatient (increased from 1052mPTA dose)  Recent Labs: Potassium (mmol/L)  Date Value  06/24/2019 3.6  11/22/2013 3.7   Magnesium (mg/dL)  Date Value  06/24/2019 2.2  11/22/2013 1.8   Calcium (mg/dL)  Date Value  06/24/2019 8.6 (L)   Calcium, Total (mg/dL)  Date Value  11/22/2013 9.0   Albumin (g/dL)  Date Value  06/12/2019 2.9 (L)  04/18/2018 4.3   Phosphorus (mg/dL)  Date Value  06/22/2019 3.7   Sodium (mmol/L)  Date Value  06/24/2019 140  05/09/2016 141  11/22/2013 133 (L)   Corrected Calcium: 9.9 mg/dL  Assessment/Plan: - K 3.6 - Will continue with scheduled KCl 82m35mO daily for today and reassess whether the order needs to be changed to PTA dose of 10mE16m- No other electrolytes warranting replacement at this time. - Will check BMP with AM labs and adjust as necessary.  Goals of Therapy:  - K ~ 4 - Mg ~ 2 - Other electrolytes WNL   Thank you for allowing pharmacy to be a part of this patient's care.   CharlLu DuffelrmD, BCPS Clinical Pharmacist 06/24/2019 7:43 AM

## 2019-06-24 NOTE — Progress Notes (Signed)
Progress Note  Patient Name: Colton Lamb Date of Encounter: 06/24/2019  Primary Cardiologist: Ida Rogue, MD   Subjective   No chest pain, palpitations, or SOB while still on 1L [place] oxygen this AM (reports he does not use oxygen at home). Stated that he is awaiting a transfusion later today.  Inpatient Medications    Scheduled Meds: . aspirin EC  81 mg Oral Daily  . atorvastatin  80 mg Oral Daily  . Chlorhexidine Gluconate Cloth  6 each Topical Q0600  . feeding supplement (NEPRO CARB STEADY)  237 mL Oral BID BM  . finasteride  5 mg Oral Daily  . furosemide  80 mg Intravenous Daily  . insulin aspart  0-9 Units Subcutaneous TID WC  . insulin detemir  9 Units Subcutaneous Daily  . lactobacillus  1 g Oral TID WC  . midodrine  15 mg Oral TID  . multivitamin-lutein   Oral Daily  . niacin  1,000 mg Oral Daily  . pantoprazole  40 mg Oral Daily  . potassium chloride  40 mEq Oral Daily  . pseudoephedrine  60 mg Oral Q6H  . rivaroxaban  20 mg Oral Daily  . sodium chloride flush  10-40 mL Intracatheter Q12H  . tamsulosin  0.4 mg Oral QPC breakfast   Continuous Infusions: . sodium chloride 250 mL (06/24/19 0505)  . cefTAZidime (FORTAZ)  IV 1 g (06/24/19 0507)   PRN Meds: sodium chloride, acetaminophen, albuterol, ALPRAZolam, guaiFENesin-dextromethorphan, ondansetron (ZOFRAN) IV, sodium chloride flush, zolpidem   Vital Signs    Vitals:   06/23/19 1624 06/23/19 2037 06/24/19 0437 06/24/19 0806  BP: 99/74 105/74 104/73 111/75  Pulse: (!) 107 98 94 98  Resp: 18 18 20 20   Temp: (!) 97.5 F (36.4 C) 97.7 F (36.5 C) 98.1 F (36.7 C) 97.6 F (36.4 C)  TempSrc: Oral Oral Oral Oral  SpO2: 99% 99% 99% 100%  Weight:      Height:        Intake/Output Summary (Last 24 hours) at 06/24/2019 1129 Last data filed at 06/24/2019 0900 Gross per 24 hour  Intake 907.14 ml  Output 1650 ml  Net -742.86 ml   Last 3 Weights 06/23/2019 06/21/2019 06/20/2019  Weight (lbs) 261 lb  3.9 oz 265 lb 14 oz 252 lb 3.3 oz  Weight (kg) 118.5 kg 120.6 kg 114.4 kg      Telemetry    Afib with RVR and rates 110s-120s - Personally Reviewed  ECG    No new tracings- Personally Reviewed  Physical Exam   GEN: No acute distress.  Joined by sister in law. Neck: JVD difficult to assess d/t body habitus Cardiac: tachycardic and IRIR; no murmurs, rubs, or gallops.  Respiratory: trace bibasilar crackles. distant breath sounds. GI: Somewhat firm, nontender, non-distended  MS: 1-2+ bilateral lower extremity edema with bilateral SCDs removed at time of exam to assess and up to the level of at least the calf; No deformity. Neuro:  Nonfocal  Psych: Normal affect   Labs    High Sensitivity Troponin:   Recent Labs  Lab 06/12/19 0100  TROPONINIHS 34*      Cardiac EnzymesNo results for input(s): TROPONINI in the last 168 hours. No results for input(s): TROPIPOC in the last 168 hours.   Chemistry Recent Labs  Lab 06/22/19 0437 06/23/19 0529 06/24/19 0458  NA 140 139 140  K 4.1 3.6 3.6  CL 93* 93* 97*  CO2 35* 34* 31  GLUCOSE 129* 113* 135*  BUN  29* 27* 28*  CREATININE 1.07 1.00 0.96  CALCIUM 8.6* 8.5* 8.6*  GFRNONAA >60 >60 >60  GFRAA >60 >60 >60  ANIONGAP 12 12 12      Hematology Recent Labs  Lab 06/22/19 0437 06/23/19 0529 06/24/19 0458  WBC 11.1* 10.5 12.8*  RBC 3.11* 3.11* 3.11*  HGB 7.6* 7.6* 7.7*  HCT 24.8* 24.9* 25.4*  MCV 79.7* 80.1 81.7  MCH 24.4* 24.4* 24.8*  MCHC 30.6 30.5 30.3  RDW 17.0* 17.2* 17.2*  PLT 317 316 313    BNPNo results for input(s): BNP, PROBNP in the last 168 hours.   DDimer No results for input(s): DDIMER in the last 168 hours.   Radiology    Ct Pelvis Wo Contrast  Result Date: 06/22/2019 CLINICAL DATA:  Pressure ulcer.  Ulcer present since August. EXAM: CT PELVIS WITHOUT CONTRAST TECHNIQUE: Multidetector CT imaging of the pelvis was performed following the standard protocol without intravenous contrast. COMPARISON:   None. FINDINGS: Musculoskeletal/soft tissues: There is a large decubitus ulcer centered over the lower sacrum and coccyx, measuring 7.6 cm in length by 6.7 cm right to left. The ulcer extends to the underlying sacrum and coccyx. There is resorption of the posterior elements of the lower sacrum and portions of the coccyx consistent with osteomyelitis. Suspect a combination of chronic and acute osteomyelitis. No fractures or bone lesions.  No other evidence of osteomyelitis. Hip joints, SI joints and symphysis pubis are normally aligned. Urinary Tract: Bladder decompressed with a Foley catheter. Distal ureters normal in caliber. Bowel: Bowel anastomosis staple line noted along the inferior right colon. No bowel wall thickening or evidence of inflammation. No bowel dilation. Vascular/Lymphatic: Scattered iliac artery atherosclerotic calcifications. No aneurysm. No enlarged lymph nodes. Reproductive:  Unremarkable. Other: Small amount of ascites. Diffuse subcutaneous edema most evident in the proximal lower extremities. IMPRESSION: 1. Large decubitus ulcer overlying the lower sacrum and coccyx with underlying bone resorption consistent with osteomyelitis. Suspect combination of acute on chronic osteomyelitis. No abscess. 2. No other acute abnormalities. 3. Small amount of ascites and subcutaneous edema. Electronically Signed   By: Lajean Manes M.D.   On: 06/22/2019 14:13    Cardiac Studies   TTE 01/2019: 1. The left ventricle has moderately reduced systolic function, with an ejection fraction of 35-40%. The cavity size was normal. Left ventricular diastolic Doppler parameters are indeterminate. Probably severe hypokinesis of mid-distal anterior,  ateroseptal and apical myocardium. 2. The right ventricle has normal systolic function. The cavity was normal. There is no increase in right ventricular wall thickness. 3. Left atrial size was moderately dilated. 4. Very poor image quality in spite of using  Definity. Valves not well visualized. Consider different diagnostic testing if indicated. 5. The aortic valve was not well visualized. Aortic valve regurgitation was not assessed by color flow Doppler.  Patient Profile     68 y.o. male with a history of CAD s/p PCI to LAD, LCx, OM, ICM (EF 35-40%, 01/2019), ICD (2015), persistent Afib on Xarelto, h/o atrial flutter s/p DCCV (2005, 2015), and being evaluated today for worsening LEE.  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 Consider multifactorial etiology with exacerbation of heart failure, current ulcer, anemia, and patient very deconditioned on exam / SOB with minimal movement in bed. --Last echo 01/2019 and reduced EF 35-40%. S/p ICD. --Still very volume up on exam. Consider also LEE and  SOB in the setting of severe anemia as below.  --Continue diuresis with IV Lasix 80 mg daily (decreased frequency to daily yesterday 11/22 and due to hypotension with SBP in the 80s) as renal function and BP allow.  Goal output negative 2L daily. BP soft but stable - avoid prerenal AKI. Monitor I/O, daily weights. Wt 119.4kg  120.6kg  118.5kg. Consider also patient reportedly has lost over 20lbs in the last year on review of previous progress notes and per patient report. Net -19.5L for admission and -982.9L yesterday. Daily BMET. Cr 1.00  0.96 with BUN 27  28.  Pharmacy monitoring electrolytes as below. ----Escalate guideline recommended therapy as tolerated.  Current escalation of therapy limited by soft blood pressure on increased dose PTA midodrine. Continue to hold beta-blocker due to low/soft blood pressure. Not currently on ACE/ARB/Entresto/Spironolactone given soft pressures. Given deconditioning, recommend OT/PT and, once able, wean oxygen as tolerated. Will need followed closely in the office.   Anemia --Hgb 7.6  7.7. HCT 24.9  25.4. Fecal  occult blood card pending. If sign of GIB, will need to consider risks versus benefits of continue [place]. On Xarelto at this time for Afib as below and ASA for h/o CAD and could consider holding Xarelto at this time and until further workup of anemia. Daily / frequent CBC. Patient reports IM planning for transfusion. Agree with this plan and recommend transfusion for Hgb below 8.0. Likely that transfusion will assist also with breathing status and LEE as suspicious for LEE 2/2 third spacing given his anemia as well.  Persistent atrial fibrillation s/p ICD --Rates low 100s-110s in the setting of the above. RVR likely exacerbated by ulcer and anemia. BB on hold with current pressures soft. BP stable on increased dose PTA midodrine.  --Still on PTA [place] with Xarelto in addition to ASA for CAD with recommendation to consider holding Xarelto for now and until able to rule out bleed. CHA2DS2VASc score of at least 4 (CHF, DM2, agex1, vascular). Patient reports medication compliance with [place]. Not currently a candidate for DCCV, however, unless able to rule out acute bleed and verify ability to maintain therapeutic anticoagulation.   Hypokalemia --K 3.6. Continue to replete with goal 4.0. Check Mg. Pharmacy consulted per IM for electrolyte monitoring. Will defer management to pharmacy.  Hypotension --Beta-blocker held and stable on increased dose PTA midodrine. Currently limited by BP in regards to escalation of guideline driven heart therapy.  CAD / Elevated HS Tn --No chest pain.High-sensitivity troponin minimally elevated and relatively flat trending in the setting of anemia, hypertension, and CHF exacerbation.  Reasonable to continue to cycle until peaked and down-trending. At this time, HS Tn more consistent with supply demand ischemia than ACS.  Continue low-dose ASA with PPI for now in the setting of multiple prior stents and pending further workup for GIB as above. Continue high intensity statin for risk  factor modification.  Leukocytosis --WBC 10.5  12.8. Consider elevation in the setting of ulcer. CT shows large ulcer of lower sacrum and coccyx with underlying bone resorption consistent with osteomyelitis. Daily CBC  Decubitus ulcer --Per IM. See above.   Remainder per IM   For questions or updates, please contact [facility] Please consult [email or web address] for contact info under        Signed, Arvil Chaco, PA-C  06/24/2019, 11:29 AM

## 2019-06-24 NOTE — Progress Notes (Signed)
PROGRESS NOTE    Colton Lamb  PRF:[id number] DOB: Feb 18, 1951 DOA: 06/12/2019 PCP: [name], MD    Brief Narrative:  Colton Lamb  is a 68 y.o. obese Caucasian male with a known history of systolic CHF, coronary artery disease hypertension dyslipidemia and Crohn's disease, presented to the emergency room with acute onset of worsening dyspnea with associated dry cough and wheezing for the last 24 hours without fever or chills.  He he admitted to orthopnea and paroxysmal nocturnal dyspnea as well as dyspnea on exertion with lower extremity edema.  No fever or chills.  No nausea or vomiting.  No dysuria, oliguria or hematuria or flank pain.  No recent exposure to COVID-19.  He was tested negative about a week ago.  Upon presentation to the emergency room, his heart rate was 102 and respiratory to 34 with pulse oximetry of 85% on CPAP and later on 100% on BiPAP.  His BNP was 731 and high-sensitivity troponin I of 34.  CBC showed leukocytosis and anemia with neutrophilia and CMP showed a CO2 of 42 with a BUN of 24 and creatinine 0.98 alk phos of 160 with otherwise normal LFTs except for albumin 2.9.  Venous blood gas with a pH 7.38 and bicarbonate of 50.9.  COVID-19 test is currently pending.  Portable chest ray showed vascular congestion and small pleural effusions with bibasal atelectasis that are new compared to prior exam.  His AICD was in place.  EKG showed atrial fibrillation with a rapid rate of 110 with left intravesicular block and prolonged QT interval with QTC of 568 MS.  In ED, the patient was given 40 mg of IV Lasix.  He will be admitted to stepdown unit for further evaluation and management.  Interim History: - 11/20: card increased midodrine dose from 10 to 15 mg tid due to soft blood pressure; also added pseudoephedrine 60 mg every 6 hours - 11/22: Bp is still soft, low, mental status normal.  -11/21: CT-pelvis showed: large decubitus ulcer overlying the lower sacrum  and coccyx with underlying bone resorption consistent with osteomyelitis. Suspect combination of acute on chronic osteomyelitis. No abscess.  - 11/22: due to persistent large decubitus ulcer consulted general surgeon, Dr. Dahlia Byes which is highly appreciated. Will place [facility]. - 11/22: will give 510 mg of IV Feraheme - 11/23: transfuse 1 unit of blood  -11/23: d/ced pseudoephedrine - 11/23 started digoxin  Subjective: Pt dose not have CP or cough.  Has mild SOB. No fever or chills. No nausea, vomiting, AP or diarreha.  Has bilateral leg edema.  Assessment & Plan:   Principal Problem:   Acute on chronic combined systolic and diastolic CHF (congestive heart failure) (HCC) Active Problems:   CAD S/P percutaneous coronary angioplasty - multiple PCIs   Atrial fibrillation ([place])   CROHN'S DISEASE-LARGE & SMALL INTESTINE   ICD (implantable cardioverter-defibrillator) in place   Diabetes mellitus type 2, uncontrolled, with complications ([place])   Acute respiratory failure (HCC)   Anemia   Stage IV pressure ulcer of sacral region (HCC)   Hypotension   Normocytic anemia   PICC (peripherally inserted central catheter) flush  Acute on chronic systolic CHF with subsequent acute hypoxic and likely hypercarbic respiratory failure:Ejection fraction 35 to 40%. Weaned offbipap. Card was consulted. Appreciate cards consult, they continue to follow. Diuresis is limited by hypotension. 11/21 Card increased midodrine dose from 10 to 15 mg tid due to soft blood pressure; also added pseudoephedrine 60 mg every 6 hours. His Bp  is better is still soft. In/out of - 3.1 L past 24 hours   -Receivedalbumin 25g q6 x 4 doses11/12, repeat 11/1525g x 4 - changed to furosemide to 80 mg IV daily by card 11/22  -->change to 40 mg bid by card (start 11/24) - Defer adding beta-blocker and ACE inhibitor/ARB in the setting of hypotension.  Hypotension: pt has been soft, but mental status normal. No fever. Has mild  leukocytosi with WBC 10.5 on 11/22, dose not seem to have sepsis now. Likely multifactorial etiology. Cortisol level normal, 11.6 -11/21 Card increased midodrine dose from 10 to 15 mg tid due to soft blood pressure - Also added pseudoephedrine 60 mg every 6 hours -->d/ced 11/24 - will check cortisol level -will transfuse 1 unit of blood 11/24   Persistent atrial fibrillation: ventricular rate reasonable in spite of discontinuation of beta-blocker. -Continue rivaroxaban, though low threshold for stopping in the setting of anemia. -added digoxin 11/23 by card  Diabetes mellitus type 2, uncontrolled, with complications; - c/wsliding scale insulin,  -checkingblood glucose AC at bedtime, -diabetic diet  Dyslipidemia. -Lipitor 80 mg daily  BPH. -continue Proscar and Flomax.  Stage 4 sacrum decubiti POA; -wound care consult appreciated, continue with Aquacel, Kerlix, ABD pad and change daily, agree with repositioning every 2 hours.  11/21: CT-pelvis showed a large decubitus ulcer overlying the lower sacrum and coccyx with underlying bone resorption consistent with osteomyelitis. Suspect combination of acute on chronic osteomyelitis. No abscess. 11/22 consulted general surgeon, Dr. Dahlia Byes, highly appreciated. No no need for debridement, but would benefit from wound vac per Dr. Dahlia Byes  WBC improved 12.2 --> 9.9 -->10.7 -->11.7 -->10.5 -->12.8 -on Fortaz -ordered Bx on 11/18 --> no grow so far -consulted ID -->pending recommendation  Normocytic anemia: hgn slowly trending down, 7.7 now. Iron study result as below -check FOBT pending - 510 mg of IV Feraheme 11/22 -transfuse 1 unit of blood 11/24  CBC Latest Ref Rng & Units 06/24/2019 06/23/2019 06/22/2019  WBC 4.0 - 10.5 K/uL 12.8(H) 10.5 11.1(H)  Hemoglobin 13.0 - 17.0 g/dL 7.7(L) 7.6(L) 7.6(L)  Hematocrit 39.0 - 52.0 % 25.4(L) 24.9(L) 24.8(L)  Platelets 150 - 400 K/uL 313 316 317   Iron/TIBC/Ferritin/ %Sat     Component Value Date/Time   IRON 29 (L) 06/21/2019 0521   TIBC 247 (L) 06/21/2019 0521   FERRITIN 95 06/21/2019 0521   IRONPCTSAT 12 (L) 06/21/2019 0521   DVT prophylaxis: on Xarelto Code Status: full code Family Communication: not today Disposition Plan: still on 1L O2, still has low Bp. Still has significant leg and thigh edema. SM/CM is working on finding SNF bed   Consultants:   Therapist, nutritional   Procedures:    Antimicrobials:  Anti-infectives (From admission, onward)   Start     Dose/Rate Route Frequency Ordered Stop   06/17/19 0830  cefTAZidime (FORTAZ) 1 g in sodium chloride 0.9 % 100 mL IVPB     1 g 200 mL/hr over 30 Minutes Intravenous Every 8 hours 06/17/19 0818           Objective: Vitals:   06/23/19 2037 06/24/19 0437 06/24/19 0806 06/24/19 1641  BP: 105/74 104/73 111/75 96/66  Pulse: 98 94 98 85  Resp: _0 Temp: 97.7 F (36.5 C) 98.1 F (36.7 C) 97.6 F (36.4 C) 98.6 F (37 C)  TempSrc: Oral Oral Oral Oral  SpO2: 99% 99% 100% 100%  Weight:      Height:  Intake/Output Summary (Last 24 hours) at 06/24/2019 1859 Last data filed at 06/24/2019 1300 Gross per 24 hour  Intake 748.88 ml  Output 800 ml  Net -51.12 ml   Filed Weights   06/20/19 0500 06/21/19 0600 06/23/19 0500  Weight: 114.4 kg 120.6 kg 118.5 kg    Examination:  Physical Exam:  General: Not in acute distress HEENT: PERRL, EOMI, no scleral icterus, No JVD or bruit Cardiac: S1/S2, RRR, No murmurs, gallops or rubs Pulm: No rales, wheezing, rhonchi or rubs. Abd: Soft, nondistended, nontender, no rebound pain, no organomegaly, BS present Ext: has 1+ leg edema bilaterally up to thigh, buttock, 1+DP/PT pulse bilaterally Musculoskeletal: No joint deformities, erythema, or stiffness, ROM full Skin: No rashes.  Neuro: Alert and oriented X3, cranial nerves II-XII grossly intact, moves all extremeties normally Psych: Patient is not psychotic, no suicidal or  hemocidal ideation.    Data Reviewed: I have personally reviewed following labs and imaging studies  CBC: Recent Labs  Lab 06/20/19 0535 06/21/19 0521 06/22/19 0437 06/23/19 0529 06/24/19 0458  WBC 9.9 10.6* 11.1* 10.5 12.8*  NEUTROABS 7.3 8.1* 8.3* 7.8* 10.5*  HGB 7.7* 7.7* 7.6* 7.6* 7.7*  HCT 25.1* 26.1* 24.8* 24.9* 25.4*  MCV 80.4 83.4 79.7* 80.1 81.7  PLT 301 330 317 316 409   Basic Metabolic Panel: Recent Labs  Lab 06/20/19 0535 06/21/19 0521 06/22/19 0437 06/23/19 0529 06/24/19 0458  NA 141 141 140 139 140  K 3.6 4.2 4.1 3.6 3.6  CL 93* 94* 93* 93* 97*  CO2 38* 35* 35* 34* 31  GLUCOSE 120* 128* 129* 113* 135*  BUN 23 26* 29* 27* 28*  CREATININE 1.03 0.88 1.07 1.00 0.96  CALCIUM 8.7* 8.5* 8.6* 8.5* 8.6*  MG 1.9 2.2 2.0 2.0 2.2  PHOS  --   --  3.7  --   --    GFR: Estimated Creatinine Clearance: 97.9 mL/min (by C-G formula based on SCr of 0.96 mg/dL). Liver Function Tests: No results for input(s): AST, ALT, ALKPHOS, BILITOT, PROT, ALBUMIN in the last 168 hours. No results for input(s): LIPASE, AMYLASE in the last 168 hours. No results for input(s): AMMONIA in the last 168 hours. Coagulation Profile: No results for input(s): INR, PROTIME in the last 168 hours. Cardiac Enzymes: No results for input(s): CKTOTAL, CKMB, CKMBINDEX, TROPONINI in the last 168 hours. BNP (last 3 results) No results for input(s): PROBNP in the last 8760 hours. HbA1C: No results for input(s): HGBA1C in the last 72 hours. CBG: Recent Labs  Lab 06/23/19 0748 06/23/19 1143 06/24/19 0809 06/24/19 1135 06/24/19 1724  GLUCAP 99 118* 105* 109* 114*   Lipid Profile: No results for input(s): CHOL, HDL, LDLCALC, TRIG, CHOLHDL, LDLDIRECT in the last 72 hours. Thyroid Function Tests: No results for input(s): TSH, T4TOTAL, FREET4, T3FREE, THYROIDAB in the last 72 hours. Anemia Panel: No results for input(s): VITAMINB12, FOLATE, FERRITIN, TIBC, IRON, RETICCTPCT in the last 72 hours.  Sepsis Labs: No results for input(s): PROCALCITON, LATICACIDVEN in the last 168 hours.  Recent Results (from the past 240 hour(s))  CULTURE, BLOOD (ROUTINE X 2) w Reflex to ID Panel     Status: None   Collection Time: 06/19/19  8:15 AM   Specimen: BLOOD  Result Value Ref Range Status   Specimen Description BLOOD BLOOD LEFT HAND  Final   Special Requests   Final    BOTTLES DRAWN AEROBIC AND ANAEROBIC Blood Culture results may not be optimal due to an excessive volume of blood received  in culture bottles   Culture   Final    NO GROWTH 5 DAYS Performed at [facility], [address] [place], [place] [address]    Report Status 06/24/2019 FINAL  Final  CULTURE, BLOOD (ROUTINE X 2) w Reflex to ID Panel     Status: None   Collection Time: 06/19/19  9:55 AM   Specimen: BLOOD LEFT HAND  Result Value Ref Range Status   Specimen Description   Final    BLOOD LEFT HAND Performed at [facility], [place] [address] [place], [place] [address]    Special Requests   Final    BOTTLES DRAWN AEROBIC AND ANAEROBIC Blood Culture results may not be optimal due to an excessive volume of blood received in culture bottles Performed at [facility], [address] [place], [place] [address]    Culture   Final    NO GROWTH 5 DAYS Performed at [facility], [place] [address] [place], [place] [address]    Report Status 06/24/2019 FINAL  Final     RN Pressure Injury Documentation: Pressure Injury 03/18/19 Sacrum Circumferential Stage IV - Full thickness tissue loss with exposed bone, tendon or muscle. wound has evolved into stage 4 pressure injury when assessed on 9/21 (Active)  03/18/19 1930  Location: Sacrum  Location Orientation: Circumferential  Staging: Stage IV - Full thickness tissue loss with exposed bone, tendon or muscle.  Wound Description (Comments): wound has evolved into stage 4 pressure injury when assessed on 9/21  Present on Admission: Yes     Pressure  Injury 04/23/19 Scrotum Left Stage II -  Partial thickness loss of dermis presenting as a shallow open ulcer with a red, pink wound bed without slough. (Active)  04/23/19 1451  Location: Scrotum  Location Orientation: Left  Staging: Stage II -  Partial thickness loss of dermis presenting as a shallow open ulcer with a red, pink wound bed without slough.  Wound Description (Comments):   Present on Admission:      Pressure Injury 04/25/19 Heel Right Stage II -  Partial thickness loss of dermis presenting as a shallow open ulcer with a red, pink wound bed without slough. skin was black and red on assessment (Active)  04/25/19 1711  Location: Heel  Location Orientation: Right  Staging: Stage II -  Partial thickness loss of dermis presenting as a shallow open ulcer with a red, pink wound bed without slough.  Wound Description (Comments): skin was black and red on assessment  Present on Admission: Yes       Radiology Studies: No results found.      Scheduled Meds: . sodium chloride   Intravenous Once  . atorvastatin  80 mg Oral Daily  . Chlorhexidine Gluconate Cloth  6 each Topical Q0600  . digoxin  0.125 mg Oral Daily  . feeding supplement (NEPRO CARB STEADY)  237 mL Oral BID BM  . finasteride  5 mg Oral Daily  . [START ON 06/25/2019] furosemide  40 mg Intravenous BID  . insulin aspart  0-9 Units Subcutaneous TID WC  . insulin detemir  9 Units Subcutaneous Daily  . lactobacillus  1 g Oral TID WC  . midodrine  15 mg Oral TID  . multivitamin-lutein   Oral Daily  . niacin  1,000 mg Oral Daily  . pantoprazole  40 mg Oral Daily  . potassium chloride  40 mEq Oral Daily  . rivaroxaban  20 mg Oral Daily  . sodium chloride  flush  10-40 mL Intracatheter Q12H  . tamsulosin  0.4 mg Oral QPC breakfast   Continuous Infusions: . sodium chloride 250 mL (06/24/19 0505)  . cefTAZidime (FORTAZ)  IV 1 g (06/24/19 1421)     LOS: 12 days    Time spent: 30 min     Ivor Costa, DO [facility] PAGER is on [place]  If 7PM-7AM, please contact night-coverage [email or web address] Password [facility] 06/24/2019, 6:59 PM

## 2019-06-24 NOTE — Care Management Important Message (Signed)
Important Message  Patient Details  Name: Colton Lamb MRN: [id number] Date of Birth: June 18, 1951   Medicare Important Message Given:  Yes     Colton Lamb A [name] 06/24/2019, 11:19 AM

## 2019-06-25 ENCOUNTER — Inpatient Hospital Stay (HOSPITAL_COMMUNITY)
Admit: 2019-06-25 | Discharge: 2019-06-25 | Disposition: A | Discharge status: Home / Self Care | Payer: Medicare Other | Attending: Physician Assistant | Admitting: Physician Assistant

## 2019-06-25 DIAGNOSIS — I4819 Other persistent atrial fibrillation: Secondary | ICD-10-CM | POA: Diagnosis not present

## 2019-06-25 DIAGNOSIS — I340 Nonrheumatic mitral (valve) insufficiency: Secondary | ICD-10-CM

## 2019-06-25 DIAGNOSIS — I5043 Acute on chronic combined systolic (congestive) and diastolic (congestive) heart failure: Secondary | ICD-10-CM | POA: Diagnosis not present

## 2019-06-25 LAB — CBC WITH DIFFERENTIAL/PLATELET
Abs Immature Granulocytes: 0.03 10*3/uL (ref 0.00–0.07)
Basophils Absolute: 0.1 10*3/uL (ref 0.0–0.1)
Basophils Relative: 1 %
Eosinophils Absolute: 0.6 10*3/uL — ABNORMAL HIGH (ref 0.0–0.5)
Eosinophils Relative: 7 %
HCT: 27.1 % — ABNORMAL LOW (ref 39.0–52.0)
Hemoglobin: 8.4 g/dL — ABNORMAL LOW (ref 13.0–17.0)
Immature Granulocytes: 0 %
Lymphocytes Relative: 13 %
Lymphs Abs: 1.2 10*3/uL (ref 0.7–4.0)
MCH: 24.4 pg — ABNORMAL LOW (ref 26.0–34.0)
MCHC: 31 g/dL (ref 30.0–36.0)
MCV: 78.8 fL — ABNORMAL LOW (ref 80.0–100.0)
Monocytes Absolute: 0.6 10*3/uL (ref 0.1–1.0)
Monocytes Relative: 7 %
Neutro Abs: 6.5 10*3/uL (ref 1.7–7.7)
Neutrophils Relative %: 72 %
Platelets: 292 10*3/uL (ref 150–400)
RBC: 3.44 MIL/uL — ABNORMAL LOW (ref 4.22–5.81)
RDW: 16.7 % — ABNORMAL HIGH (ref 11.5–15.5)
WBC: 9 10*3/uL (ref 4.0–10.5)
nRBC: 0 % (ref 0.0–0.2)

## 2019-06-25 LAB — MAGNESIUM: Magnesium: 2 mg/dL (ref 1.7–2.4)

## 2019-06-25 LAB — GLUCOSE, CAPILLARY
Glucose-Capillary: 171 mg/dL — ABNORMAL HIGH (ref 70–99)
Glucose-Capillary: 101 mg/dL — ABNORMAL HIGH (ref 70–99)
Glucose-Capillary: 89 mg/dL (ref 70–99)
Glucose-Capillary: 102 mg/dL — ABNORMAL HIGH (ref 70–99)
Glucose-Capillary: 121 mg/dL — ABNORMAL HIGH (ref 70–99)

## 2019-06-25 LAB — BASIC METABOLIC PANEL
Anion gap: 10 (ref 5–15)
BUN: 23 mg/dL (ref 8–23)
CO2: 32 mmol/L (ref 22–32)
Calcium: 8.7 mg/dL — ABNORMAL LOW (ref 8.9–10.3)
Chloride: 98 mmol/L (ref 98–111)
Creatinine, Ser: 0.87 mg/dL (ref 0.61–1.24)
GFR calc Af Amer: >60 mL/min (ref >60)
GFR calc non Af Amer: >60 mL/min (ref >60)
Glucose, Bld: 101 mg/dL — ABNORMAL HIGH (ref 70–99)
Potassium: 3.6 mmol/L (ref 3.5–5.1)
Sodium: 140 mmol/L (ref 135–145)

## 2019-06-25 LAB — TYPE AND SCREEN
ABO/RH(D): B POS
Antibody Screen: NEGATIVE
Unit division: 0

## 2019-06-25 LAB — BPAM RBC
Blood Product Expiration Date: 202012182359
ISSUE DATE / TIME: 202011232333
Unit Type and Rh: 7300

## 2019-06-25 LAB — ECHOCARDIOGRAM LIMITED
Height: 72 in
Weight: 4179.92 oz

## 2019-06-25 MED ORDER — SPIRONOLACTONE 25 MG PO TABS
12.5000 mg | ORAL_TABLET | Freq: Every day | ORAL | Status: DC
  Administered 2019-06-25 – 2019-07-02 (×8): 12.5 mg via ORAL
  Filled 2019-06-25: qty 1
  Filled 2019-06-25: qty 0.5
  Filled 2019-06-25 (×2): qty 1
  Filled 2019-06-25 (×3): qty 0.5
  Filled 2019-06-25: qty 1
  Filled 2019-06-25: qty 0.5
  Filled 2019-06-25: qty 1
  Filled 2019-06-25 (×5): qty 0.5

## 2019-06-25 MED ORDER — VITAMIN C 500 MG PO TABS
500.0000 mg | ORAL_TABLET | Freq: Two times a day (BID) | ORAL | Status: DC
  Administered 2019-06-25 – 2019-07-09 (×25): 500 mg via ORAL
  Filled 2019-06-25 (×25): qty 1

## 2019-06-25 MED ORDER — PERFLUTREN LIPID MICROSPHERE
1.0000 mL | INTRAVENOUS | Status: AC | PRN
  Administered 2019-06-25: 2 mL via INTRAVENOUS
  Filled 2019-06-25: qty 10

## 2019-06-25 MED ORDER — MIDODRINE HCL 5 MG PO TABS
10.0000 mg | ORAL_TABLET | Freq: Three times a day (TID) | ORAL | Status: DC
  Administered 2019-06-25 – 2019-06-26 (×3): 10 mg via ORAL
  Filled 2019-06-25 (×2): qty 2

## 2019-06-25 NOTE — Progress Notes (Signed)
Initial Nutrition Assessment  DOCUMENTATION CODES:   Obesity unspecified  INTERVENTION:   Nepro Shake po BID, each supplement provides 425 kcal and 19 grams protein  Ocuvite daily for wound healing (provides zinc, vitamin A, vitamin C, Vitamin E, copper, and selenium)  Vitamin C 565m po BID   NUTRITION DIAGNOSIS:   Increased nutrient needs related to wound healing as evidenced by increased estimated needs.  GOAL:   Patient will meet greater than or equal to 90% of their needs  MONITOR:   PO intake, Supplement acceptance, Labs, Weight trends, Skin, I & O's  REASON FOR ASSESSMENT:   LOS    ASSESSMENT:   68y.o. male with a known history of systolic CHF, coronary artery disease hypertension dyslipidemia and Crohn's disease admitted with CHF and sacral wound   Pt is well known to nutrition department and this RD from multiple previous admits. Pt is generally a good eater at baseline. Pt currently eating 100% of meals in hospital and drinking some Nepro. RD will add vitamins to support wound healing. Per chart, pt down ~15lbs(8%) over the past 6 months; this is not significant.    Medications reviewed and include: lasix, insulin, lactobacillus, ocuvite, niacin, protonix, aldactone, ceftazidime  Labs reviewed: Hgb 8.4(L), Hct 27.1(L), MCV 78.8(L), MCH 24.4(L) Iron 29(L), TIBC 247(L), ferritin 95- 11/20 cbgs- 89, 102, 121 x 24 hrs AIC 5.1- 11/11  NUTRITION - FOCUSED PHYSICAL EXAM:    Most Recent Value  Orbital Region  No depletion  Upper Arm Region  No depletion  Thoracic and Lumbar Region  No depletion  Buccal Region  No depletion  Temple Region  No depletion  Clavicle Bone Region  No depletion  Clavicle and Acromion Bone Region  No depletion  Scapular Bone Region  No depletion  Dorsal Hand  No depletion  Patellar Region  No depletion  Anterior Thigh Region  No depletion  Posterior Calf Region  No depletion  Edema (RD Assessment)  Mild  Hair  Reviewed  Eyes   Reviewed  Mouth  Reviewed  Skin  Reviewed  Nails  Reviewed     Diet Order:   Diet Order            Diet Carb Modified Fluid consistency: Thin; Room service appropriate? Yes  Diet effective now             EDUCATION NEEDS:   Education needs have been addressed  Skin:  Skin Assessment: Reviewed RN Assessment(Sacral wound  6.5cm x 9.5cm x 3.5cm with VAC)  Last BM:  11/23- type 5  Height:   Ht Readings from Last 1 Encounters:  06/13/19 6' (1.829 m)    Weight:   Wt Readings from Last 1 Encounters:  06/23/19 118.5 kg    Ideal Body Weight:  80.9 kg  BMI:  Body mass index is 35.43 kg/m.  Estimated Nutritional Needs:   Kcal:  2300-2600kcal/day  Protein:  115-130g/day  Fluid:  >2L/day  CKoleen DistanceMS, RD, LDN Pager #- [phone]#- [phone] Hours Pager: [phone]

## 2019-06-25 NOTE — Progress Notes (Signed)
*  PRELIMINARY RESULTS* Echocardiogram 2D Echocardiogram has been performed.  Sherrie Sport 06/25/2019, 1:25 PM

## 2019-06-25 NOTE — Evaluation (Signed)
Occupational Therapy Evaluation Patient Details Name: Colton Lamb MRN: [id number] DOB: 1950/10/10 Today's Date: 06/25/2019    History of Present Illness Colton Lamb presented to ER secondary to respiratory distress; admitted for management of acute hypoxic/hypercarbic respiratory failure (requiring BiPAP at initial presentation).  Of note, patient with large, chronic stage IV sacral decubitus ulcer.   Clinical Impression   Pt seen for OT evaluation this date. Prior to hospital admission, pt was living by himself and denied difficulty with ADL and mobility, using SPC in community for longer distances. Currently pt demonstrates impairments in strength, activity tolerance, balance, and now requires significant assist for LB ADL and functional mobility efforts. Pt reports he will occasionally weigh himself at home every 2-3 days when he remembers. Pt denied noticing the fluid overload at home until he began having difficulty breathing but realizes now what was happening. Pt instructed in CHF chronic disease mgt strategies including improving tracking and daily weighing at the same time each day, s/s that he may be approaching fluid overload, and risks of ignoring s/s; pt verbalized understanding. Pt would benefit from skilled OT to address noted impairments and functional limitations (see below for any additional details) in order to maximize safety and independence while minimizing falls risk and caregiver burden as well as improve chronic disease self mgt to minmize risk of readmission. Upon hospital discharge, recommend pt discharge to SNF.    Follow Up Recommendations  SNF    Equipment Recommendations  Tub/shower seat    Recommendations for Other Services       Precautions / Restrictions Precautions Precautions: Fall Precaution Comments: *sTg4 Sacral wound Restrictions Weight Bearing Restrictions: No      Mobility Bed Mobility                  Transfers                       Balance                                           ADL either performed or assessed with clinical judgement   ADL Overall ADL's : Needs assistance/impaired                                       General ADL Comments: Max A for LB ADL from EOB, +2 for OOB attempts     Vision Baseline Vision/History: Wears glasses Wears Glasses: Reading only Patient Visual Report: No change from baseline Vision Assessment?: No apparent visual deficits     Perception     Praxis      Pertinent Vitals/Pain Pain Assessment: No/denies pain     Hand Dominance Right   Extremity/Trunk Assessment Upper Extremity Assessment Upper Extremity Assessment: Overall WFL for tasks assessed   Lower Extremity Assessment Lower Extremity Assessment: Generalized weakness(edema has improved drastically, +1 pitting edema in feet)       Communication Communication Communication: No difficulties   Cognition Arousal/Alertness: Awake/alert Behavior During Therapy: WFL for tasks assessed/performed Overall Cognitive Status: Within Functional Limits for tasks assessed                                     General Comments  Exercises Other Exercises Other Exercises: Pt instructed in CHF chronic disease mgt strategies including improving tracking and daily weighing, s/s that he may be approaching fluid overload, and risks of ignoring s/s; pt verbalized understanding   Shoulder Instructions      Home Living Family/patient expects to be discharged to:: Skilled nursing facility                                        Prior Functioning/Environment Level of Independence: Needs assistance        Comments: Prior to hospitalization 03/2019 pt was independent with occasional use of walking stick for longer distances; has been at [facility] since hospital discharge and reports recent use of hoyer lift for transfers at facility, still  working with PT        OT Problem List: Decreased strength;Increased edema;Decreased activity tolerance;Impaired balance (sitting and/or standing);Decreased knowledge of use of DME or AE      OT Treatment/Interventions: Self-care/ADL training;Therapeutic exercise;Therapeutic activities;Energy conservation;DME and/or AE instruction;Patient/family education;Balance training    OT Goals(Current goals can be found in the care plan section) Acute Rehab OT Goals Patient Stated Goal: get better and go home OT Goal Formulation: With patient Time For Goal Achievement: 07/09/19 Potential to Achieve Goals: Good ADL Goals Pt Will Transfer to Toilet: with mod assist;bedside commode;stand pivot transfer Additional ADL Goal #1: Pt will verbalize plan to implement learned strategies to track weights at home to support CHF mgt.  OT Frequency: Min 1X/week   Barriers to D/C:            Co-evaluation              AM-PAC OT "6 Clicks" Daily Activity     Outcome Measure Help from another person eating meals?: None Help from another person taking care of personal grooming?: A Little Help from another person toileting, which includes using toliet, bedpan, or urinal?: Total Help from another person bathing (including washing, rinsing, drying)?: A Lot Help from another person to put on and taking off regular upper body clothing?: A Little Help from another person to put on and taking off regular lower body clothing?: A Lot 6 Click Score: 15   End of Session    Activity Tolerance: Patient tolerated treatment well Patient left: in bed;with call bell/phone within reach;with bed alarm set;with SCD's reapplied  OT Visit Diagnosis: Other abnormalities of gait and mobility (R26.89);Muscle weakness (generalized) (M62.81);History of falling (Z91.81)                Time: 0100-7121 OT Time Calculation (min): 19 min Charges:  OT General Charges $OT Visit: 1 Visit OT Evaluation $OT Eval Moderate  Complexity: 1 Mod OT Treatments $Therapeutic Activity: 8-22 mins  Jeni Salles, MPH, MS, OTR/L ascom [phone] 06/25/19, 2:42 PM

## 2019-06-25 NOTE — Progress Notes (Signed)
Progress Note  Patient Name: Colton Lamb Date of Encounter: 06/25/2019  Primary Cardiologist: Rockey Situ  Subjective   He reports he is feeling well this morning and denies any chest pain.  States his breathing continues to improve.  Renal function remained stable with IV diuresis.  Documented urine output of 46 mL for the past 24 hours with a net negative of 19.5 L for the admission.  No weight this morning.  Wound VAC in place.  Inpatient Medications    Scheduled Meds: . atorvastatin  80 mg Oral Daily  . Chlorhexidine Gluconate Cloth  6 each Topical Q0600  . digoxin  0.125 mg Oral Daily  . feeding supplement (NEPRO CARB STEADY)  237 mL Oral BID BM  . finasteride  5 mg Oral Daily  . furosemide  40 mg Intravenous BID  . insulin aspart  0-9 Units Subcutaneous TID WC  . insulin detemir  9 Units Subcutaneous Daily  . lactobacillus  1 g Oral TID WC  . midodrine  10 mg Oral TID  . multivitamin-lutein   Oral Daily  . niacin  1,000 mg Oral Daily  . pantoprazole  40 mg Oral Daily  . rivaroxaban  20 mg Oral Daily  . sodium chloride flush  10-40 mL Intracatheter Q12H  . spironolactone  12.5 mg Oral Daily  . tamsulosin  0.4 mg Oral QPC breakfast   Continuous Infusions: . sodium chloride 250 mL (06/25/19 0529)  . cefTAZidime (FORTAZ)  IV 1 g (06/25/19 0530)   PRN Meds: sodium chloride, acetaminophen, albuterol, ALPRAZolam, guaiFENesin-dextromethorphan, ondansetron (ZOFRAN) IV, sodium chloride flush, zolpidem   Vital Signs    Vitals:   06/24/19 2316 06/24/19 2359 06/25/19 0457 06/25/19 0821  BP: 103/70 92/67 92/69  90/70  Pulse: 64 97 94 87  Resp: (!) 22 18 20  (!) 22  Temp: 97.6 F (36.4 C) 97.7 F (36.5 C) 97.6 F (36.4 C) 98.1 F (36.7 C)  TempSrc:  Oral Oral   SpO2: 98% 99% 100% 100%  Weight:      Height:        Intake/Output Summary (Last 24 hours) at 06/25/2019 1002 Last data filed at 06/25/2019 0530 Gross per 24 hour  Intake 1013.79 ml  Output 1300 ml  Net  -286.21 ml   Filed Weights   06/20/19 0500 06/21/19 0600 06/23/19 0500  Weight: 114.4 kg 120.6 kg 118.5 kg    Telemetry    A. fib, 90s to low 100s bpm- Personally Reviewed  ECG    No new tracings- Personally Reviewed  Physical Exam   GEN: No acute distress.   Neck: JVD 8 to 10 cm. Cardiac:  Irregularly irregular, no murmurs, rubs, or gallops.  Respiratory: Clear to auscultation bilaterally.  GI: Soft, nontender, non-distended.   MS:  1-2+ bilateral lower extremity pitting edema to the thighs/buttocks edema; No deformity.  Wound VAC in place. Neuro:  Alert and oriented x 3; Nonfocal.  Psych: Normal affect.  Labs    Chemistry Recent Labs  Lab 06/23/19 0529 06/24/19 0458 06/25/19 0524  NA 139 140 140  K 3.6 3.6 3.6  CL 93* 97* 98  CO2 34* 31 32  GLUCOSE 113* 135* 101*  BUN 27* 28* 23  CREATININE 1.00 0.96 0.87  CALCIUM 8.5* 8.6* 8.7*  GFRNONAA >60 >60 >60  GFRAA >60 >60 >60  ANIONGAP 12 12 10      Hematology Recent Labs  Lab 06/23/19 0529 06/24/19 0458 06/25/19 0524  WBC 10.5 12.8* 9.0  RBC 3.11*  3.11* 3.44*  HGB 7.6* 7.7* 8.4*  HCT 24.9* 25.4* 27.1*  MCV 80.1 81.7 78.8*  MCH 24.4* 24.8* 24.4*  MCHC 30.5 30.3 31.0  RDW 17.2* 17.2* 16.7*  PLT 316 313 292    Cardiac EnzymesNo results for input(s): TROPONINI in the last 168 hours. No results for input(s): TROPIPOC in the last 168 hours.   BNPNo results for input(s): BNP, PROBNP in the last 168 hours.   DDimer No results for input(s): DDIMER in the last 168 hours.   Radiology    No results found.  Cardiac Studies   2D echo 01/2019: 1. The left ventricle has moderately reduced systolic function, with an ejection fraction of 35-40%. The cavity size was normal. Left ventricular diastolic Doppler parameters are indeterminate. Probably severe hypokinesis of mid-distal anterior,  ateroseptal and apical myocardium.  2. The right ventricle has normal systolic function. The cavity was normal. There is no  increase in right ventricular wall thickness.  3. Left atrial size was moderately dilated.  4. Very poor image quality in spite of using Definity. Valves not well visualized. Consider different diagnostic testing if indicated.  5. The aortic valve was not well visualized. Aortic valve regurgitation was not assessed by color flow Doppler. __________  2D echo 06/25/2019: Pending  Patient Profile     68 y.o. 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  Assessment & Plan    1. Acute on chronic combined systolic and diastolic CHF: -He remains volume up on exam, which is likely mildly exacerbated by 3rd spacing in the setting of anemia -ContinueTED hose/SCDs -Continue IV diuresis with Lasix 40 mg twice daily -Add spironolactone 12.5 mg daily -Continue low-dose digoxin which was started 11/23, check level on 11/25 -BB/ACEi/ARB/Entresto on hold secondary to hypotension  -Escalate evidence based therapy as able -Daily standing weights -Strict I/O  2.Persistent Afib: -He remains in Afib with reasonably controlled ventricular response -Beta blocker on hold in the setting of hypotension as above -Xarelto -Has been in Afib for ~ past 12 months -Pursue rate control strategy  -Estimated Creatinine Clearance: 108 mL/min (by C-G formula based on SCr of 0.87 mg/dL).  3. CAD involving the native coronary arteries without angina/elevated HS-Tn: -Initial HS-Tn of 34, not cycled -No chest pain -On Xarelto in place of ASA -Lipitor  4. OSA: -CPAP  5. Anemia: -Low, though stable at  8.4 -Likely contributing to symptoms   6. Hypokalemia: -Maintain goal of 4.0 -Discontinue KCl repletion -Start low-dose spironolactone 12.5 mg daily  7. Hypotension: -Likely multifactorial including anemia, hypoalbuminemia, deconditioning in the setting of multiple admissions over the past year, sacral wound -Chronic issue, asymptomatic -Decrease midodrine to 10 mg 3 times daily given underlying CHF   For questions or updates, please contact [facility] Please consult [email or web address] for contact info under Cardiology/STEMI.    Signed, Christell Faith, PA-C [place] Pager: [phone] 06/25/2019, 10:02 AM

## 2019-06-25 NOTE — Progress Notes (Signed)
ID Stage IV sacral decubitus No picture to assess Covered with wound  vac CT scan from 06/22/19 Large decubitus ulcer overlying the lower sacrum and coccyx with underlying bone resorption consistent with osteomyelitis. Suspect combination of acute on chronic osteomyelitis As per surgeon it is beefy red and does not need debridement as it does not look infected.  Would recommend, diverting colostomy, wound vac and antibiotics to help it heal completely as currently there is risk of stool contamination as evidenced by e.coli in culture done at wound center. Without assessing the wound it is difficult to give recommendation.

## 2019-06-25 NOTE — Progress Notes (Signed)
*  PRELIMINARY RESULTS* Echocardiogram 2D Echocardiogram has been performed.  Sherrie Sport 06/25/2019, 1:30 PM

## 2019-06-25 NOTE — Progress Notes (Signed)
PROGRESS NOTE    Colton Lamb  ZJQ:[id number] DOB: [birth year] DOA: 06/12/2019 PCP: [name], MD    Brief Narrative:  Colton Lamb  is a 68 y.o. obese Caucasian male with a known history of systolic CHF, coronary artery disease hypertension dyslipidemia and Crohn's disease, presented to the emergency room with acute onset of worsening dyspnea with associated dry cough and wheezing for the last 24 hours without fever or chills.  He he admitted to orthopnea and paroxysmal nocturnal dyspnea as well as dyspnea on exertion with lower extremity edema.  No fever or chills.  No nausea or vomiting.  No dysuria, oliguria or hematuria or flank pain.  No recent exposure to COVID-19.  He was tested negative about a week ago.  Upon presentation to the emergency room, his heart rate was 102 and respiratory to 34 with pulse oximetry of 85% on CPAP and later on 100% on BiPAP.  His BNP was 731 and high-sensitivity troponin I of 34.  CBC showed leukocytosis and anemia with neutrophilia and CMP showed a CO2 of 42 with a BUN of 24 and creatinine 0.98 alk phos of 160 with otherwise normal LFTs except for albumin 2.9.  Venous blood gas with a pH 7.38 and bicarbonate of 50.9.  COVID-19 test is currently pending.  Portable chest ray showed vascular congestion and small pleural effusions with bibasal atelectasis that are new compared to prior exam.  His AICD was in place.  EKG showed atrial fibrillation with a rapid rate of 110 with left intravesicular block and prolonged QT interval with QTC of 568 MS.  In ED, the patient was given 40 mg of IV Lasix.  He will be admitted to stepdown unit for further evaluation and management.  Interim History: - 11/20: card increased midodrine dose from 10 to 15 mg tid due to soft blood pressure; also added pseudoephedrine 60 mg every 6 hours - 11/22: Bp is still soft, low, mental status normal.  -11/21: CT-pelvis showed: large decubitus ulcer overlying the lower sacrum  and coccyx with underlying bone resorption consistent with osteomyelitis. Suspect combination of acute on chronic osteomyelitis. No abscess.  - 11/22: due to persistent large decubitus ulcer consulted general surgeon, Dr. Dahlia Byes which is highly appreciated. Will place [facility]. - 11/22: will give 510 mg of IV Feraheme - 11/23: transfuse 1 unit of blood  -11/23: d/ced pseudoephedrine - 11/23 started digoxin - 11/24: consulted ID.  Subjective: Pt dose not have CP or cough.  Has mild SOB. No fever or chills. No nausea, vomiting, AP or diarreha.  Has bilateral leg edema.  Assessment & Plan:   Principal Problem:   Acute on chronic combined systolic and diastolic CHF (congestive heart failure) (HCC) Active Problems:   CAD S/P percutaneous coronary angioplasty - multiple PCIs   Atrial fibrillation ([place])   CROHN'S DISEASE-LARGE & SMALL INTESTINE   ICD (implantable cardioverter-defibrillator) in place   Diabetes mellitus type 2, uncontrolled, with complications ([place])   Acute respiratory failure (HCC)   Anemia   Stage IV pressure ulcer of sacral region (HCC)   Hypotension   Normocytic anemia   PICC (peripherally inserted central catheter) flush  Acute on chronic systolic CHF with subsequent acute hypoxic and likely hypercarbic respiratory failure:Ejection fraction 35 to 40%. Weaned offbipap. Card was consulted. Appreciate cards consult, they continue to follow. Diuresis is limited by hypotension. 11/21 Card increased midodrine dose from 10 to 15 mg tid due to soft blood pressure; also added pseudoephedrine 60 mg every  6 hours. His Bp is better is still soft. In/out of - 3.1 L past 24 hours   -Receivedalbumin 25g q6 x 4 doses11/12, repeat 11/1525g x 4 - changed to furosemide to 80 mg IV daily by card 11/22  -->change to 40 mg bid by card (start 11/24) -ContinueTED hose/SCDs -Add spironolactone 12.5 mg daily by Card 11/24 -Continue low-dose digoxin which was started 11/23, check level on  11/25 -BB/ACEi/ARB/Entresto on hold secondary to hypotension  -Daily standing weights -Strict I/O  Hypotension: pt has been soft, but mental status normal. No fever. Has mild leukocytosi with WBC 10.5 on 11/22, dose not seem to have sepsis now. Likely multifactorial etiology. Cortisol level normal, 11.6 . transfused 1 unit of blood 11/24. Bp is slightly better, 92/69 in AM. -11/21 Card increased midodrine dose from 10 to 15 mg tid due to soft blood pressure -->changed back to 10 mg tid by card on 11/24 - Also added pseudoephedrine 60 mg every 6 hours -->d/ced 11/24   Persistent atrial fibrillation: ventricular rate reasonable in spite of discontinuation of beta-blocker. -Continue rivaroxaban, though low threshold for stopping in the setting of anemia. -added digoxin 11/23 by card  Diabetes mellitus type 2, uncontrolled, with complications; - c/wsliding scale insulin,  -checkingblood glucose AC at bedtime, -diabetic diet  Dyslipidemia. -Lipitor 80 mg daily  BPH. -continue Proscar and Flomax.  Stage 4 sacrum decubiti POA; -wound care consult appreciated, continue with Aquacel, Kerlix, ABD pad and change daily, agree with repositioning every 2 hours.  11/21: CT-pelvis showed a large decubitus ulcer overlying the lower sacrum and coccyx with underlying bone resorption consistent with osteomyelitis. Suspect combination of acute on chronic osteomyelitis. No abscess. 11/22 consulted general surgeon, Dr. Dahlia Byes, highly appreciated. No no need for debridement, but would benefit from wound vac per Dr. Dahlia Byes. WBC improved 12.2 --> 9.9 -->10.7 -->11.7 -->10.5 -->12.8-->9.0  -11/23: WAC started -on Fortaz -ordered Bx on 11/18 --> no grow so far -consulted ID 11/24: per Dr. Delaine Lame, "Would recommend, diverting colostomy, wound vac and antibiotics to help it heal completely as currently there is risk of stool contamination as evidenced by e.coli in culture done at wound center".   Normocytic anemia: Iron study result as below -check FOBT pending - 510 mg of IV Feraheme 11/22 -transfuse 1 unit of blood 11/24 --> Hgb 8.4 on 11/24  CBC Latest Ref Rng & Units 06/25/2019 06/24/2019 06/23/2019  WBC 4.0 - 10.5 K/uL 9.0 12.8(H) 10.5  Hemoglobin 13.0 - 17.0 g/dL 8.4(L) 7.7(L) 7.6(L)  Hematocrit 39.0 - 52.0 % 27.1(L) 25.4(L) 24.9(L)  Platelets 150 - 400 K/uL 292 313 316   Iron/TIBC/Ferritin/ %Sat    Component Value Date/Time   IRON 29 (L) 06/21/2019 0521   TIBC 247 (L) 06/21/2019 0521   FERRITIN 95 06/21/2019 0521   IRONPCTSAT 12 (L) 06/21/2019 0521   DVT prophylaxis: on Xarelto Code Status: full code Family Communication: not today Disposition Plan: still on 1L O2, still has low Bp. Still has significant leg and thigh edema. SM/CM is working on finding SNF bed   Consultants:   Therapist, nutritional   Procedures:    Antimicrobials:  Anti-infectives (From admission, onward)   Start     Dose/Rate Route Frequency Ordered Stop   06/17/19 0830  cefTAZidime (FORTAZ) 1 g in sodium chloride 0.9 % 100 mL IVPB     1 g 200 mL/hr over 30 Minutes Intravenous Every 8 hours 06/17/19 0818           Objective:  Vitals:   06/25/19 0821 06/25/19 0850 06/25/19 1431 06/25/19 2051  BP: 90/70   102/65  Pulse: 87  91 89  Resp: (!) _0 Temp: 98.1 F (36.7 C)   (!) 97.5 F (36.4 C)  TempSrc:    Oral  SpO2: 100%  100% 100%  Weight:      Height:        Intake/Output Summary (Last 24 hours) at 06/25/2019 2118 Last data filed at 06/25/2019 1501 Gross per 24 hour  Intake 1389.75 ml  Output 1300 ml  Net 89.75 ml   Filed Weights   06/20/19 0500 06/21/19 0600 06/23/19 0500  Weight: 114.4 kg 120.6 kg 118.5 kg    Examination:  Physical Exam:  General: Not in acute distress HEENT: PERRL, EOMI, no scleral icterus, No JVD or bruit Cardiac: S1/S2, RRR, No murmurs, gallops or rubs Pulm: No rales, wheezing, rhonchi or rubs. Abd: Soft, nondistended,  nontender, no rebound pain, no organomegaly, BS present Ext: has 1+ leg edema bilaterally up to thigh, buttock, 1+DP/PT pulse bilaterally Musculoskeletal: No joint deformities, erythema, or stiffness, ROM full Skin: No rashes. Has large sacral decubitus ulcer on WAC Neuro: Alert and oriented X3, cranial nerves II-XII grossly intact, moves all extremeties normally Psych: Patient is not psychotic, no suicidal or hemocidal ideation.    Data Reviewed: I have personally reviewed following labs and imaging studies  CBC: Recent Labs  Lab 06/21/19 0521 06/22/19 0437 06/23/19 0529 06/24/19 0458 06/25/19 0524  WBC 10.6* 11.1* 10.5 12.8* 9.0  NEUTROABS 8.1* 8.3* 7.8* 10.5* 6.5  HGB 7.7* 7.6* 7.6* 7.7* 8.4*  HCT 26.1* 24.8* 24.9* 25.4* 27.1*  MCV 83.4 79.7* 80.1 81.7 78.8*  PLT 330 317 316 313 697   Basic Metabolic Panel: Recent Labs  Lab 06/21/19 0521 06/22/19 0437 06/23/19 0529 06/24/19 0458 06/25/19 0524  NA 141 140 139 140 140  K 4.2 4.1 3.6 3.6 3.6  CL 94* 93* 93* 97* 98  CO2 35* 35* 34* 31 32  GLUCOSE 128* 129* 113* 135* 101*  BUN 26* 29* 27* 28* 23  CREATININE 0.88 1.07 1.00 0.96 0.87  CALCIUM 8.5* 8.6* 8.5* 8.6* 8.7*  MG 2.2 2.0 2.0 2.2 2.0  PHOS  --  3.7  --   --   --    GFR: Estimated Creatinine Clearance: 108 mL/min (by C-G formula based on SCr of 0.87 mg/dL). Liver Function Tests: No results for input(s): AST, ALT, ALKPHOS, BILITOT, PROT, ALBUMIN in the last 168 hours. No results for input(s): LIPASE, AMYLASE in the last 168 hours. No results for input(s): AMMONIA in the last 168 hours. Coagulation Profile: No results for input(s): INR, PROTIME in the last 168 hours. Cardiac Enzymes: No results for input(s): CKTOTAL, CKMB, CKMBINDEX, TROPONINI in the last 168 hours. BNP (last 3 results) No results for input(s): PROBNP in the last 8760 hours. HbA1C: No results for input(s): HGBA1C in the last 72 hours. CBG: Recent Labs  Lab 06/24/19 1724 06/24/19 2122  06/25/19 0816 06/25/19 1151 06/25/19 1718  GLUCAP 114* 101* 89 102* 121*   Lipid Profile: No results for input(s): CHOL, HDL, LDLCALC, TRIG, CHOLHDL, LDLDIRECT in the last 72 hours. Thyroid Function Tests: No results for input(s): TSH, T4TOTAL, FREET4, T3FREE, THYROIDAB in the last 72 hours. Anemia Panel: No results for input(s): VITAMINB12, FOLATE, FERRITIN, TIBC, IRON, RETICCTPCT in the last 72 hours. Sepsis Labs: No results for input(s): PROCALCITON, LATICACIDVEN in the last 168 hours.  Recent Results (from the past 240  hour(s))  CULTURE, BLOOD (ROUTINE X 2) w Reflex to ID Panel     Status: None   Collection Time: 06/19/19  8:15 AM   Specimen: BLOOD  Result Value Ref Range Status   Specimen Description BLOOD BLOOD LEFT HAND  Final   Special Requests   Final    BOTTLES DRAWN AEROBIC AND ANAEROBIC Blood Culture results may not be optimal due to an excessive volume of blood received in culture bottles   Culture   Final    NO GROWTH 5 DAYS Performed at [facility], [address] [place], [place] [address]    Report Status 06/24/2019 FINAL  Final  CULTURE, BLOOD (ROUTINE X 2) w Reflex to ID Panel     Status: None   Collection Time: 06/19/19  9:55 AM   Specimen: BLOOD LEFT HAND  Result Value Ref Range Status   Specimen Description   Final    BLOOD LEFT HAND Performed at [facility], [place] [address], [place], [place] [address]    Special Requests   Final    BOTTLES DRAWN AEROBIC AND ANAEROBIC Blood Culture results may not be optimal due to an excessive volume of blood received in culture bottles Performed at [facility], [address] [place], [place] [address]    Culture   Final    NO GROWTH 5 DAYS Performed at [facility], [place] [address], [place], [place] [address]    Report Status 06/24/2019 FINAL  Final     RN Pressure Injury Documentation: Pressure Injury 03/18/19 Sacrum Circumferential Stage IV - Full thickness tissue loss with  exposed bone, tendon or muscle. wound has evolved into stage 4 pressure injury when assessed on 9/21 (Active)  03/18/19 1930  Location: Sacrum  Location Orientation: Circumferential  Staging: Stage IV - Full thickness tissue loss with exposed bone, tendon or muscle.  Wound Description (Comments): wound has evolved into stage 4 pressure injury when assessed on 9/21  Present on Admission: Yes     Pressure Injury 04/23/19 Scrotum Left Stage II -  Partial thickness loss of dermis presenting as a shallow open ulcer with a red, pink wound bed without slough. (Active)  04/23/19 1451  Location: Scrotum  Location Orientation: Left  Staging: Stage II -  Partial thickness loss of dermis presenting as a shallow open ulcer with a red, pink wound bed without slough.  Wound Description (Comments):   Present on Admission:      Pressure Injury 04/25/19 Heel Right Stage II -  Partial thickness loss of dermis presenting as a shallow open ulcer with a red, pink wound bed without slough. skin was black and red on assessment (Active)  04/25/19 1711  Location: Heel  Location Orientation: Right  Staging: Stage II -  Partial thickness loss of dermis presenting as a shallow open ulcer with a red, pink wound bed without slough.  Wound Description (Comments): skin was black and red on assessment  Present on Admission: Yes       Radiology Studies: No results found.      Scheduled Meds: . atorvastatin  80 mg Oral Daily  . Chlorhexidine Gluconate Cloth  6 each Topical Q0600  . digoxin  0.125 mg Oral Daily  . feeding supplement (NEPRO CARB STEADY)  237 mL Oral BID BM  . finasteride  5 mg Oral Daily  . furosemide  40 mg Intravenous BID  . insulin aspart  0-9 Units Subcutaneous TID WC  . insulin detemir  9 Units Subcutaneous Daily  . lactobacillus  1 g Oral TID WC  . midodrine  10 mg Oral TID  . multivitamin-lutein   Oral Daily  . niacin  1,000 mg Oral Daily  . pantoprazole  40 mg Oral Daily  .  rivaroxaban  20 mg Oral Daily  . sodium chloride flush  10-40 mL Intracatheter Q12H  . spironolactone  12.5 mg Oral Daily  . tamsulosin  0.4 mg Oral QPC breakfast  . vitamin C  500 mg Oral BID   Continuous Infusions: . sodium chloride 10 mL/hr at 06/25/19 1436  . cefTAZidime (FORTAZ)  IV 1 g (06/25/19 2113)     LOS: 13 days    Time spent: 30 min     Ivor Costa, DO [facility] PAGER is on [place]  If 7PM-7AM, please contact night-coverage [email or web address] Password [facility] 06/25/2019, 9:18 PM

## 2019-06-25 NOTE — Consult Note (Addendum)
[place] for Electrolyte Monitoring and Replacement   Colton Lamb is a 24 YOM with a hx of CHF, CAD, HTN, dyslipidemia, and Crohn's Disease who presented to the ED with acute onset of worsening dyspnea associated with dry cough and wheezing. He was placed on BiPAP and admitted to stepdown for further evaluation.  Pharmacy has been consulted for electrolyte monitoring and replacement.  Patient is receiving 40 mg IV Lasix q12h. He is on a carb modified diet and also getting NEPRO CARB STEADY 271m BID between meals. He is also receiving potassium chloride 456m PO daily inpatient (increased from 1073mPTA dose)  Recent Labs: Potassium (mmol/L)  Date Value  06/25/2019 3.6  11/22/2013 3.7   Magnesium (mg/dL)  Date Value  06/25/2019 2.0  11/22/2013 1.8   Calcium (mg/dL)  Date Value  06/25/2019 8.7 (L)   Calcium, Total (mg/dL)  Date Value  11/22/2013 9.0   Albumin (g/dL)  Date Value  06/12/2019 2.9 (L)  04/18/2018 4.3   Phosphorus (mg/dL)  Date Value  06/22/2019 3.7   Sodium (mmol/L)  Date Value  06/25/2019 140  05/09/2016 141  11/22/2013 133 (L)   Corrected Calcium: 10.0 mg/dL  Assessment/Plan: - K 3.6 - Will continue with scheduled KCl 100m71mO daily for today and reassess whether the order needs to be changed to PTA dose of 10mE61m- No other electrolytes warranting replacement at this time. - Will check BMP with AM labs one more day  Goals of Therapy:  - K ~ 4 - Mg ~ 2 - Other electrolytes WNL   Thank you for allowing pharmacy to be a part of this patient's care.   CharlLu DuffelrmD, BCPS Clinical Pharmacist 06/25/2019 7:31 AM

## 2019-06-26 DIAGNOSIS — Z91048 Other nonmedicinal substance allergy status: Secondary | ICD-10-CM

## 2019-06-26 DIAGNOSIS — Z794 Long term (current) use of insulin: Secondary | ICD-10-CM

## 2019-06-26 DIAGNOSIS — L89154 Pressure ulcer of sacral region, stage 4: Secondary | ICD-10-CM | POA: Diagnosis not present

## 2019-06-26 DIAGNOSIS — I255 Ischemic cardiomyopathy: Secondary | ICD-10-CM

## 2019-06-26 DIAGNOSIS — Z7901 Long term (current) use of anticoagulants: Secondary | ICD-10-CM

## 2019-06-26 DIAGNOSIS — D72829 Elevated white blood cell count, unspecified: Secondary | ICD-10-CM

## 2019-06-26 DIAGNOSIS — Z95828 Presence of other vascular implants and grafts: Secondary | ICD-10-CM

## 2019-06-26 DIAGNOSIS — Z91013 Allergy to seafood: Secondary | ICD-10-CM

## 2019-06-26 DIAGNOSIS — D649 Anemia, unspecified: Secondary | ICD-10-CM | POA: Diagnosis not present

## 2019-06-26 DIAGNOSIS — Z960 Presence of urogenital implants: Secondary | ICD-10-CM

## 2019-06-26 DIAGNOSIS — I4819 Other persistent atrial fibrillation: Secondary | ICD-10-CM | POA: Diagnosis not present

## 2019-06-26 DIAGNOSIS — Z9889 Other specified postprocedural states: Secondary | ICD-10-CM

## 2019-06-26 DIAGNOSIS — Z79899 Other long term (current) drug therapy: Secondary | ICD-10-CM

## 2019-06-26 DIAGNOSIS — R0603 Acute respiratory distress: Secondary | ICD-10-CM

## 2019-06-26 DIAGNOSIS — E119 Type 2 diabetes mellitus without complications: Secondary | ICD-10-CM

## 2019-06-26 DIAGNOSIS — Z881 Allergy status to other antibiotic agents status: Secondary | ICD-10-CM

## 2019-06-26 DIAGNOSIS — I5043 Acute on chronic combined systolic (congestive) and diastolic (congestive) heart failure: Secondary | ICD-10-CM | POA: Diagnosis not present

## 2019-06-26 LAB — BASIC METABOLIC PANEL
Anion gap: 11 (ref 5–15)
BUN: 23 mg/dL (ref 8–23)
CO2: 31 mmol/L (ref 22–32)
Calcium: 8.9 mg/dL (ref 8.9–10.3)
Chloride: 98 mmol/L (ref 98–111)
Creatinine, Ser: 0.78 mg/dL (ref 0.61–1.24)
GFR calc Af Amer: >60 mL/min (ref >60)
GFR calc non Af Amer: >60 mL/min (ref >60)
Glucose, Bld: 140 mg/dL — ABNORMAL HIGH (ref 70–99)
Potassium: 4 mmol/L (ref 3.5–5.1)
Sodium: 140 mmol/L (ref 135–145)

## 2019-06-26 LAB — CBC WITH DIFFERENTIAL/PLATELET
Abs Immature Granulocytes: 0.13 10*3/uL — ABNORMAL HIGH (ref 0.00–0.07)
Basophils Absolute: 0.1 10*3/uL (ref 0.0–0.1)
Basophils Relative: 0 %
Eosinophils Absolute: 0.5 10*3/uL (ref 0.0–0.5)
Eosinophils Relative: 3 %
HCT: 29.1 % — ABNORMAL LOW (ref 39.0–52.0)
Hemoglobin: 9 g/dL — ABNORMAL LOW (ref 13.0–17.0)
Immature Granulocytes: 1 %
Lymphocytes Relative: 5 %
Lymphs Abs: 0.8 10*3/uL (ref 0.7–4.0)
MCH: 24.9 pg — ABNORMAL LOW (ref 26.0–34.0)
MCHC: 30.9 g/dL (ref 30.0–36.0)
MCV: 80.6 fL (ref 80.0–100.0)
Monocytes Absolute: 0.6 10*3/uL (ref 0.1–1.0)
Monocytes Relative: 4 %
Neutro Abs: 14.5 10*3/uL — ABNORMAL HIGH (ref 1.7–7.7)
Neutrophils Relative %: 87 %
Platelets: 278 10*3/uL (ref 150–400)
RBC: 3.61 MIL/uL — ABNORMAL LOW (ref 4.22–5.81)
RDW: 16.9 % — ABNORMAL HIGH (ref 11.5–15.5)
WBC: 16.6 10*3/uL — ABNORMAL HIGH (ref 4.0–10.5)
nRBC: 0 % (ref 0.0–0.2)

## 2019-06-26 LAB — GLUCOSE, CAPILLARY
Glucose-Capillary: 105 mg/dL — ABNORMAL HIGH (ref 70–99)
Glucose-Capillary: 118 mg/dL — ABNORMAL HIGH (ref 70–99)
Glucose-Capillary: 123 mg/dL — ABNORMAL HIGH (ref 70–99)
Glucose-Capillary: 118 mg/dL — ABNORMAL HIGH (ref 70–99)
Glucose-Capillary: 101 mg/dL — ABNORMAL HIGH (ref 70–99)

## 2019-06-26 LAB — DIGOXIN LEVEL: Digoxin Level: 0.2 ng/mL — ABNORMAL LOW (ref 0.8–2.0)

## 2019-06-26 MED ORDER — METOPROLOL SUCCINATE ER 25 MG PO TB24
12.5000 mg | ORAL_TABLET | Freq: Every day | ORAL | Status: DC
  Administered 2019-06-26 – 2019-06-30 (×5): 12.5 mg via ORAL
  Filled 2019-06-26 (×5): qty 1

## 2019-06-26 MED ORDER — PIPERACILLIN-TAZOBACTAM 3.375 G IVPB
3.3750 g | Freq: Three times a day (TID) | INTRAVENOUS | Status: DC
  Administered 2019-06-26 – 2019-07-09 (×38): 3.375 g via INTRAVENOUS
  Filled 2019-06-26 (×37): qty 50

## 2019-06-26 MED ORDER — MIDODRINE HCL 5 MG PO TABS
5.0000 mg | ORAL_TABLET | Freq: Three times a day (TID) | ORAL | Status: DC
  Administered 2019-06-26 – 2019-07-02 (×18): 5 mg via ORAL
  Filled 2019-06-26 (×18): qty 1

## 2019-06-26 NOTE — Consult Note (Signed)
NAME: Colton Lamb  DOB: 1951-01-02  MRN: [id number]  Date/Time: 06/26/2019 7:36 PM  REQUESTING PROVIDER: Blaine Hamper Subjective:  REASON FOR CONSULT: sacral decubitus ?Chart thoroughly reviewed and history obtained from patient as well. Colton Lamb is a 68 y.o. male with a history of coronary artery disease, ischemic cardiomyopathy, paroxysmal A. fib, defibrillator in place, obstructive sleep apnea, congestive heart failure, was admitted on 06/12/2019 from [facility] due to respiratory distress. Patient has had multiple hospitalizations in the past few months.  [facility].  Chest x-ray showed vascular congestion and pleural effusion and bibasilar atelectasis.  EKG showed atrial fibrillation with a rate of 110.  He was given 40 mg of IV Lasix and was admitted to stepdown.  He also had anemia.  He was seen by cardiologist who increased his Lasix to twice daily 60 mg dose with strict intake output and daily weights.  Also monitoring creatinine with diuresis. For the paroxysmal atrial fibrillation since his heart rate was reasonably controlled Xarelto was continued.  And he was continued on aspirin and Lipitor for the CAD and PCI.  He continued to be in A. fib but unable to give any medication because of hypotension.  Marland Kitchen He was seen by surgeon for the sacral decubitus and Dr. Perrin Maltese did not think it was infected and asked for a wound VAC. I am asked to see the patient for sacral decubitus As per patient he was walking before August but in the last 4 months he has had multiple hospitalization that has made him deconditioned and bedbound and  had developed the sacral decubitus. Patient is currently on ceftazidime.  Multiple hospitalizations since Aug Hospitalized between  03/04/2019 until 03/19/2019 for sepsis, pleural effusion on the right side with acute renal failure and empyema of the lung.  During that hospitalization he had Prevotella and strep  viridans growing out of the empyema culture on the right side and it was thought to be due to aspiration and he was treated with IV Unasyn and then p.o. Augmentin.  He also had a chest tube for the entire stay.  During that hospitalization he also had SVT and paroxysmal atrial fibrillation.   Patient also had a urinary retention during that hospitalization and had Foley for some period of time and then started on Flomax and Proscar.  Patient was also noted to have a stage II decubitus ulcer with diffuse area of skin tear and purplish discoloration during that hospitalization.  Second hospitalization 04/21/2019 until 04/26/2019 The sacral ulcer was bleeding and and surgery was consulted.  His INR was 3.8 in the ED even though he was only on Xarelto.  His hemoglobin had dropped to 7 from baseline of over 9.  The sacral wound was large with necrotic bed and abundant clots.  Surgeon debrided the necrotic tissue and remove the blood clots.  Ischemic fat tissue and muscle and bone were debrided down to the sacral bone on the midline.       He received blood transfusion on the on discharge he was asked to follow-up with wound care clinic.  The Foley was still in place during that hospitalization.  On 05/09/2019 he went to the wound clinic and culture from the sacral wound showed E. coli and Citrobacter which was sensitive to 5 days ago, cefepime, imipenem.  Past Medical History:  Diagnosis Date   Atrial flutter ([place])    a. s/p Cardioversion 11/22/13, on amiodarone and Xarelto.   CHF (  congestive heart failure) (HCC)    Chronic systolic heart failure ([place])    a. 10/2013 EF 20-25%, grade III DD, RV mildly dilated and sys fx mild/mod reduced;  b. 01/2014 Echo: EF 30-35%, gr3 DD, mod dil LA.   Coronary artery disease    a. s/p MI 2007/2015;  b. s/p prior PCI to the LAD/LCX/PDA/PL;  c. 2008: s/p Cypher DES to the OM.   Crohn's ileocolitis ([place])    GERD (gastroesophageal reflux disease)    Hx of  adenomatous colonic polyps 11/2003   Hyperlipidemia    Hypertension    Ischemic cardiomyopathy    s. 01/2014 s/p MDT [id number] [name] XT DR single lead AICD.   Obesity    Paroxysmal atrial fibrillation (HCC)    a. CHA2DS2VASc = 4-->xarelto/amio.   Sleep apnea    Syncope    a.  11/2013 in setting of volume depletion and bradycardia due to dig toxicity    Type II diabetes mellitus ([place])     Past Surgical History:  Procedure Laterality Date   ATRIAL FLUTTER ABLATION N/A 04/16/2014   Procedure: ATRIAL FLUTTER ABLATION;  Surgeon: Evans Lance, MD;  Location: [facility] CATH LAB;  Service: Cardiovascular;  Laterality: N/A;   CARDIAC CATHETERIZATION  10/2013   CARDIAC DEFIBRILLATOR PLACEMENT  04/16/2014   Medtronic Evira device   CARDIAC ELECTROPHYSIOLOGY STUDY AND ABLATION  04/16/2014   atrial flutter ablation   CARDIOVERSION N/A 03/05/2014   Procedure: CARDIOVERSION;  Surgeon: Jolaine Artist, MD;  Location: [facility];  Service: Cardiovascular;  Laterality: N/A;   CATARACT EXTRACTION W/PHACO Right 01/04/2017   Procedure: CATARACT EXTRACTION PHACO AND INTRAOCULAR LENS PLACEMENT ([place])  Right Diabetic Complicated;  Surgeon: Leandrew Koyanagi, MD;  Location: [facility];  Service: Ophthalmology;  Laterality: Right;  Diabetic   CATARACT EXTRACTION W/PHACO Left 02/08/2017   Procedure: CATARACT EXTRACTION PHACO AND INTRAOCULAR LENS PLACEMENT ([facility]) left diabetic;  Surgeon: Leandrew Koyanagi, MD;  Location: [facility];  Service: Ophthalmology;  Laterality: Left;  Diabetic - oral meds sleep apnea   CORONARY ANGIOPLASTY WITH STENT PLACEMENT  2007; 2008 X 2   "1+1 ~ 1"   FOOT SURGERY Left    bone spur   HYDROCELE EXCISION Bilateral    Ileocecal resection and sigmoid enterocolonic fistula repair  09/1998   IMPLANTABLE CARDIOVERTER DEFIBRILLATOR IMPLANT N/A 04/16/2014   Procedure: IMPLANTABLE CARDIOVERTER DEFIBRILLATOR IMPLANT;  Surgeon: Evans Lance, MD;  Location: [facility]  CATH LAB;  Service: Cardiovascular;  Laterality: N/A;   LEFT HEART CATHETERIZATION WITH CORONARY ANGIOGRAM N/A 11/22/2013   Procedure: LEFT HEART CATHETERIZATION WITH CORONARY ANGIOGRAM;  Surgeon: Sinclair Grooms, MD;  Location: [facility] CATH LAB;  Service: Cardiovascular;  Laterality: N/A;    Social History   Socioeconomic History   Marital status: Widowed    Spouse name: Not on file   Number of children: 1   Years of education: Not on file   Highest education level: Not on file  Occupational History   Occupation: retired    Fish farm manager: RETIRED  Scientist, product/process development strain: Not hard at all   Food insecurity    Worry: Never true    Inability: Never true   Transportation needs    Medical: No    Non-medical: No  Tobacco Use   Smoking status: Never Smoker   Smokeless tobacco: Never Used  Substance and Sexual Activity   Alcohol use: No   Drug use: No   Sexual activity: Never  Lifestyle  Physical activity    Days per week: 0 days    Minutes per session: 0 min   Stress: Not on file  Relationships   Social connections    Talks on phone: Never    Gets together: Never    Attends religious service: Never    Active member of club or organization: Yes    Attends meetings of clubs or organizations: 1 to 4 times per year    Relationship status: Never married   Intimate partner violence    Fear of current or ex partner: No    Emotionally abused: Not on file    Physically abused: No    Forced sexual activity: No  Other Topics Concern   Not on file  Social History Narrative   Not on file    Family History  Problem Relation Age of Onset   Breast cancer Mother    Heart disease Father    Heart attack Father    Colon cancer Neg Hx    Allergies  Allergen Reactions   Iodine Other (See Comments)    Shortness of breath, swelling and hives   Shrimp [Shellfish Allergy] Other (See Comments)    SWELLING    HIVES    SHORTNESS OF BREATH    Tetracycline Rash    ?Current hospital medicines Piperacillin tazobactam Atorvastatin Digoxin Furosemide Metoprolol Midodrine [place] Spironolactone Xanax as needed Zolpidem as needed at bedtime Levemir insulin 9 units NovoLog as needed Probiotic Pantoprazole Finasteride 5 mg Tamsulosin 0.4 mg Xarelto Vitamin C Multivitamin Feeding supplement Nepro carb        Abtx:  Anti-infectives (From admission, onward)   Start     Dose/Rate Route Frequency Ordered Stop   06/26/19 1800  piperacillin-tazobactam (ZOSYN) IVPB 3.375 g     3.375 g 12.5 mL/hr over 240 Minutes Intravenous Every 8 hours 06/26/19 1639     06/17/19 0830  cefTAZidime (FORTAZ) 1 g in sodium chloride 0.9 % 100 mL IVPB  Status:  Discontinued     1 g 200 mL/hr over 30 Minutes Intravenous Every 8 hours 06/17/19 0818 06/26/19 1635      REVIEW OF SYSTEMS:  Const: negative fever, negative chills, negative weight loss Eyes: negative diplopia or visual changes, negative eye pain ENT: negative coryza, negative sore throat Resp: cough, , dyspnea Cards: negative for chest pain, palpitations, positive extremity edema GU: foley in place since aug 2020 GI: Negative for abdominal pain,, has fecal incontinence Skin: negative for rash and pruritus Heme:r easy bruising and  bleeding MS: Deconditioning and muscle weakness Neurolo:negative for headaches, dizziness, vertigo, memory problems  Psych: negative for feelings of anxiety, depression  Endocrine: Diabetes allergy/Immunology-as above: Objective:  VITALS:  BP 92/63 (BP Location: Left Arm)    Pulse 87    Temp (!) 97.5 F (36.4 C) (Oral)    Resp 18    Ht 6' (1.829 m)    Wt 118.5 kg    SpO2 100%    BMI 35.43 kg/m  PHYSICAL EXAM:  General: Alert, cooperative, no distress, appears stated age.  Head: Normocephalic, without obvious abnormality, atraumatic. Eyes: Conjunctivae clear, anicteric sclerae. Pupils are equal ENT Nares normal. No drainage or sinus  tenderness. Lips, mucosa, and tongue normal. No Thrush Neck: Supple,  ICD site okay Back: Stage IV sacral decubitus Bone felt Red tissue no slough or odor or purulent discharge    Lungs: Bilateral air entry Decreased bases Heart: Irregular abdomen: Soft, non-tender,not distended. Bowel sounds normal. No masses Extremities: Did not remove  the compression stocking Skin: No rashes or lesions. Or bruising Lymph: Cervical, supraclavicular normal. Neurologic: Grossly non-focal Pertinent Labs Lab Results CBC    Component Value Date/Time   WBC 16.6 (H) 06/26/2019 0449   RBC 3.61 (L) 06/26/2019 0449   HGB 9.0 (L) 06/26/2019 0449   HGB 13.9 11/22/2013 0301   HCT 29.1 (L) 06/26/2019 0449   HCT 43.3 11/22/2013 0301   PLT 278 06/26/2019 0449   PLT 350 11/22/2013 0301   MCV 80.6 06/26/2019 0449   MCV 81 11/22/2013 0301   MCH 24.9 (L) 06/26/2019 0449   MCHC 30.9 06/26/2019 0449   RDW 16.9 (H) 06/26/2019 0449   RDW 16.1 (H) 11/22/2013 0301   LYMPHSABS 0.8 06/26/2019 0449   MONOABS 0.6 06/26/2019 0449   EOSABS 0.5 06/26/2019 0449   BASOSABS 0.1 06/26/2019 0449    CMP Latest Ref Rng & Units 06/26/2019 06/25/2019 06/24/2019  Glucose 70 - 99 mg/dL 140(H) 101(H) 135(H)  BUN 8 - 23 mg/dL 23 23 28(H)  Creatinine 0.61 - 1.24 mg/dL 0.78 0.87 0.96  Sodium 135 - 145 mmol/L 140 140 140  Potassium 3.5 - 5.1 mmol/L 4.0 3.6 3.6  Chloride 98 - 111 mmol/L 98 98 97(L)  CO2 22 - 32 mmol/L 31 32 31  Calcium 8.9 - 10.3 mg/dL 8.9 8.7(L) 8.6(L)  Total Protein 6.5 - 8.1 g/dL - - -  Total Bilirubin 0.3 - 1.2 mg/dL - - -  Alkaline Phos 38 - 126 U/L - - -  AST 15 - 41 U/L - - -  ALT 0 - 44 U/L - - -      Microbiology: Recent Results (from the past 240 hour(s))  CULTURE, BLOOD (ROUTINE X 2) w Reflex to ID Panel     Status: None   Collection Time: 06/19/19  8:15 AM   Specimen: BLOOD  Result Value Ref Range Status   Specimen Description BLOOD BLOOD LEFT HAND  Final   Special Requests   Final     BOTTLES DRAWN AEROBIC AND ANAEROBIC Blood Culture results may not be optimal due to an excessive volume of blood received in culture bottles   Culture   Final    NO GROWTH 5 DAYS Performed at [facility], [address] [place], [place] [address]    Report Status 06/24/2019 FINAL  Final  CULTURE, BLOOD (ROUTINE X 2) w Reflex to ID Panel     Status: None   Collection Time: 06/19/19  9:55 AM   Specimen: BLOOD LEFT HAND  Result Value Ref Range Status   Specimen Description   Final    BLOOD LEFT HAND Performed at [facility], [address] [place], [place] [address]    Special Requests   Final    BOTTLES DRAWN AEROBIC AND ANAEROBIC Blood Culture results may not be optimal due to an excessive volume of blood received in culture bottles Performed at [facility], [address], [place], [place] [address]    Culture   Final    NO GROWTH 5 DAYS Performed at [facility], [place] [address], [place], [place] [address]    Report Status 06/24/2019 FINAL  Final    IMAGING RESULTS: CT pelvis without contrast Large decubitus ulcer overlying the lower sacrum and coccyx with underlying bone resorption consistent with osteomyelitis. Suspect combination of acute on chronic osteomyelitis. No abscess   I have personally reviewed the films ? 2D echo 06/25/2019 EF 20 to 25% with left ventricle having  severely decreased function.  Severely dilated left ventricular cavity.  Global hypokinesis with thinning and akinesis of the anterior and septal walls.  Impression/Recommendation 68 year old male with multiple comorbidities including ischemic cardiomyopathy, CHF, defibrillator, sacral decubitus, diabetes mellitus, OSA, A. fib  Stage IV sacral decubitus present since August September 2020.  Patient since August 2020 has been in nursing home in hospitals and has been pretty much in bed.  He is severely deconditioned. Even though the decubitus does not look overtly  infected it is deep and the bone is exposed.  There is chronic osteomyelitis evident on CT scan.  This decubitus is not going to heal with antibiotics.  He needs to start mobilizing with PT.  The decubitus is now covered with a wound VAC.  There has been contamination with stool.  So a diverting colostomy, wound VAC and antibiotics is the best combination for treatment of this decubitus along with improving his mobility and function. Okay to continue with Zosyn now for now Will discuss with surgeon regarding diverting colostomy  Leukocytosis: Patient has had robust response in the past as well and during his previous hospitalization there was a concern whether he had underlying CLL.  It may be worth getting oncology to review him.  Ischemic cardiomyopathy with congestive heart failure with EF of 20 to 25%: Has AICD.  Management as per primary team and cardiologist  Anemia: Has had bleeding from the ulcer in the past and has been transfused.  He is on Xarelto for A. fib  Paroxysmal A. fib/atrial flutter on digoxin, low-dose metoprolol  Congestive heart failure on furosemide and spironolactone. As he is hypotensive often the most medications are difficult to titrate. He is on midodrine  Diabetes mellitus on Levemir and sliding scale   Chronic urinary Foley catheter since August 2020 at that admission had urinary retention Foley was placed he was also started on tamsulosin and finasteride during that hospitalization.  Recommend getting urology consult to see whether the Foley catheter can be removed.  Patient is unable to follow-up as outpatient because he is between hospital and nursing home for the last 4 months.   Right PICC line placed on 06/15/2019 ? ___________________________________________________ Discussed with patient and his nurse.  Note:  This document was prepared using Dragon voice recognition software and may include unintentional dictation errors.

## 2019-06-26 NOTE — Progress Notes (Signed)
Progress Note  Patient Name: Colton Lamb Date of Encounter: 06/26/2019  Primary Cardiologist: Rockey Situ  Subjective   He reports he is feeling well this morning and denies any chest pain, palpitations, fever, or chills.  States his breathing continues to improve.  Renal function remains stable with IV diuresis.  Documented urine output of 284 mL for the past 24 hours with a net negative of 20.0 L for the admission.  No weight this morning.  Wound VAC in place. Tachycardic into the 120s over the past 24 hours. WBC trending up from 9-->16.6. Tmax 98.1. HGB 8.4-->9.0. Limited echo 11/24 showed worsening cardiomyopathy with an EF of 20-25%.   Inpatient Medications    Scheduled Meds: . atorvastatin  80 mg Oral Daily  . Chlorhexidine Gluconate Cloth  6 each Topical Q0600  . digoxin  0.125 mg Oral Daily  . feeding supplement (NEPRO CARB STEADY)  237 mL Oral BID BM  . finasteride  5 mg Oral Daily  . furosemide  40 mg Intravenous BID  . insulin aspart  0-9 Units Subcutaneous TID WC  . insulin detemir  9 Units Subcutaneous Daily  . lactobacillus  1 g Oral TID WC  . metoprolol succinate  12.5 mg Oral Daily  . midodrine  5 mg Oral TID  . multivitamin-lutein   Oral Daily  . niacin  1,000 mg Oral Daily  . pantoprazole  40 mg Oral Daily  . rivaroxaban  20 mg Oral Daily  . sodium chloride flush  10-40 mL Intracatheter Q12H  . spironolactone  12.5 mg Oral Daily  . tamsulosin  0.4 mg Oral QPC breakfast  . vitamin C  500 mg Oral BID   Continuous Infusions: . sodium chloride 10 mL/hr at 06/25/19 2113  . cefTAZidime (FORTAZ)  IV 1 g (06/26/19 0522)   PRN Meds: sodium chloride, acetaminophen, albuterol, ALPRAZolam, guaiFENesin-dextromethorphan, ondansetron (ZOFRAN) IV, sodium chloride flush, zolpidem   Vital Signs    Vitals:   06/25/19 1431 06/25/19 2051 06/26/19 0525 06/26/19 0746  BP:  102/65 110/73 98/71  Pulse: 91 89 (!) 110 (!) 104  Resp:  18 17   Temp:  (!) 97.5 F (36.4 C)  97.6 F (36.4 C)   TempSrc:  Oral Oral   SpO2: 100% 100% 99% 97%  Weight:      Height:        Intake/Output Summary (Last 24 hours) at 06/26/2019 0800 Last data filed at 06/25/2019 2256 Gross per 24 hour  Intake 615.96 ml  Output 900 ml  Net -284.04 ml   Filed Weights   06/20/19 0500 06/21/19 0600 06/23/19 0500  Weight: 114.4 kg 120.6 kg 118.5 kg    Telemetry    A. fib, low 100s to 120s bpm bpm- Personally Reviewed  ECG    No new tracings- Personally Reviewed  Physical Exam   GEN: No acute distress.   Neck: JVD remains elevated to the angle of the mandible. Cardiac:  Mildly tachycardic, irregularly irregular, no murmurs, rubs, or gallops.  Respiratory: Clear to auscultation bilaterally.  GI: Soft, nontender, non-distended.   MS:  1+ bilateral lower extremity pitting edema to the thighs/buttocks edema; No deformity.  Wound VAC in place. Neuro:  Alert and oriented x 3; Nonfocal.  Psych: Normal affect.  Labs    Chemistry Recent Labs  Lab 06/24/19 0458 06/25/19 0524 06/26/19 0449  NA 140 140 140  K 3.6 3.6 4.0  CL 97* 98 98  CO2 31 32 31  GLUCOSE 135*  101* 140*  BUN 28* 23 23  CREATININE 0.96 0.87 0.78  CALCIUM 8.6* 8.7* 8.9  GFRNONAA >60 >60 >60  GFRAA >60 >60 >60  ANIONGAP 12 10 11      Hematology Recent Labs  Lab 06/24/19 0458 06/25/19 0524 06/26/19 0449  WBC 12.8* 9.0 16.6*  RBC 3.11* 3.44* 3.61*  HGB 7.7* 8.4* 9.0*  HCT 25.4* 27.1* 29.1*  MCV 81.7 78.8* 80.6  MCH 24.8* 24.4* 24.9*  MCHC 30.3 31.0 30.9  RDW 17.2* 16.7* 16.9*  PLT 313 292 278    Cardiac EnzymesNo results for input(s): TROPONINI in the last 168 hours. No results for input(s): TROPIPOC in the last 168 hours.   BNPNo results for input(s): BNP, PROBNP in the last 168 hours.   DDimer No results for input(s): DDIMER in the last 168 hours.   Radiology    No results found.  Cardiac Studies   2D echo 01/2019: 1. The left ventricle has moderately reduced systolic function,  with an ejection fraction of 35-40%. The cavity size was normal. Left ventricular diastolic Doppler parameters are indeterminate. Probably severe hypokinesis of mid-distal anterior,  ateroseptal and apical myocardium.  2. The right ventricle has normal systolic function. The cavity was normal. There is no increase in right ventricular wall thickness.  3. Left atrial size was moderately dilated.  4. Very poor image quality in spite of using Definity. Valves not well visualized. Consider different diagnostic testing if indicated.  5. The aortic valve was not well visualized. Aortic valve regurgitation was not assessed by color flow Doppler. __________  2D echo 06/25/2019: Pending  Patient Profile     68 y.o. 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  Assessment & Plan    1. Acute on chronic combined systolic and diastolic CHF: -He remains volume up on exam, which is likely mildly exacerbated by 3rd spacing in the setting of anemia -ContinueTED hose/SCDs -Continue IV diuresis with Lasix 40 mg twice daily -Continue spironolactone 12.5 mg daily -Continue low-dose digoxin which was started 11/23, with a digoxin level of 0.2 -Add Toprol XL 12.5 mg daily with hold parameters of BP < 90/50 -With his worsening cardiomyopathy, we will need to pursue ischemic evaluation as his volume status and infection improve with a R/LHC -ACEi/ARB/Entresto on hold secondary to hypotension  -Escalate evidence based therapy as able -Daily standing weights -Strict I/O  2.Persistent Afib with RVR:  -He remains in Afib with ventricular rates more tachycardic over the past 24 hours -Add Toprol XL as above -Continue digoxin -Cannot exclude progressive infection given worsening ventricular rates and leukocytosis  -Infectious workup per IM -Xarelto -Has been in Afib for ~ past 12 months -Pursue rate control strategy  -Estimated Creatinine Clearance: 117.5 mL/min (by C-G formula based on SCr of 0.78 mg/dL).  3. CAD involving the native coronary arteries without angina/elevated HS-Tn: -Initial HS-Tn of 34, not cycled -No chest pain -On Xarelto in place of ASA -Lipitor  4. OSA: -CPAP  5. Anemia: -Slightly improved  -Likely contributing to symptoms   6. Hypokalemia: -Maintain goal of 4.0 -Discontinue  KCl repletion -Continue low-dose spironolactone 12.5 mg daily  7. Hypotension: -Likely multifactorial including anemia, hypoalbuminemia, deconditioning in the setting of multiple admissions over the past year, sacral wound -Chronic issue, asymptomatic -Decrease midodrine to 10 mg 3 times daily given underlying CHF  8. Leukocytosis: -Infectious workup per IM as above   For questions or updates, please contact [place] Please consult [email or web address] for contact info under Cardiology/STEMI.    Signed, Christell Faith, PA-C [place] Pager: [phone] 06/26/2019, 8:00 AM

## 2019-06-26 NOTE — Consult Note (Signed)
[facility] Nurse Consult Note: Patient receiving care in [facility]. Reason for Consult: Sacral VAC dressing change completed by primary RN, Freddie Breech this morning.  VAC pump functioning without difficulty.  Supplies present in room for Friday. Val Riles, RN, MSN, CWOCN, CNS-BC, pager [phone]

## 2019-06-26 NOTE — Progress Notes (Addendum)
PROGRESS NOTE    Colton Lamb  FMB:[id number] DOB: 08-27-1950 DOA: 06/12/2019 PCP: [name], MD    Brief Narrative:  Colton Lamb  is a 68 y.o. obese Caucasian male with a known history of systolic CHF, coronary artery disease hypertension dyslipidemia and Crohn's disease, presented to the emergency room with acute onset of worsening dyspnea with associated dry cough and wheezing for the last 24 hours without fever or chills.  He he admitted to orthopnea and paroxysmal nocturnal dyspnea as well as dyspnea on exertion with lower extremity edema.  No fever or chills.  No nausea or vomiting.  No dysuria, oliguria or hematuria or flank pain.  No recent exposure to COVID-19.  He was tested negative about a week ago.  Upon presentation to the emergency room, his heart rate was 102 and respiratory to 34 with pulse oximetry of 85% on CPAP and later on 100% on BiPAP.  His BNP was 731 and high-sensitivity troponin I of 34.  CBC showed leukocytosis and anemia with neutrophilia and CMP showed a CO2 of 42 with a BUN of 24 and creatinine 0.98 alk phos of 160 with otherwise normal LFTs except for albumin 2.9.  Venous blood gas with a pH 7.38 and bicarbonate of 50.9.  COVID-19 test is currently pending.  Portable chest ray showed vascular congestion and small pleural effusions with bibasal atelectasis that are new compared to prior exam.  His AICD was in place.  EKG showed atrial fibrillation with a rapid rate of 110 with left intravesicular block and prolonged QT interval with QTC of 568 MS.  In ED, the patient was given 40 mg of IV Lasix.  He will be admitted to stepdown unit for further evaluation and management.  Interim History: - 11/20: card increased midodrine dose from 10 to 15 mg tid due to soft blood pressure; also added pseudoephedrine 60 mg every 6 hours - 11/22: Bp is still soft, low, mental status normal.  -11/21: CT-pelvis showed: large decubitus ulcer overlying the lower sacrum  and coccyx with underlying bone resorption consistent with osteomyelitis. Suspect combination of acute on chronic osteomyelitis. No abscess.  - 11/22: due to persistent large decubitus ulcer consulted general surgeon, Dr. Dahlia Byes which is highly appreciated. Will place [facility]. - 11/22: will give 510 mg of IV Feraheme - 11/23: transfuse 1 unit of blood  -11/23: d/ced pseudoephedrine - 11/23 started digoxin - 11/24: consulted ID. - 11/24: 2D echo showed left ventricular ejection fraction, by visual estimation, is 20 to 25%. The left ventricle has severely decreased function. There is mildly increased left ventricular hypertrophy. - 11/25: Added Toprol XL 12.5 mg daily with hold parameters of BP < 90/50 by card - 11/25: Flexiseal - 11/25: switch Fortaz to Zosyn  Subjective: Pt dose not have CP or cough.  Has mild SOB. No fever or chills. No nausea, vomiting, AP or diarreha.  Has bilateral leg edema. Has stool incontinence.  Assessment & Plan:   Principal Problem:   Acute on chronic combined systolic and diastolic CHF (congestive heart failure) (HCC) Active Problems:   CAD S/P percutaneous coronary angioplasty - multiple PCIs   Atrial fibrillation ([place])   CROHN'S DISEASE-LARGE & SMALL INTESTINE   ICD (implantable cardioverter-defibrillator) in place   Diabetes mellitus type 2, uncontrolled, with complications ([place])   Acute respiratory failure (HCC)   Anemia   Stage IV pressure ulcer of sacral region ([place])   Hypotension   Normocytic anemia   PICC (peripherally inserted central catheter)  flush  Acute on chronic systolic CHF with subsequent acute hypoxic and likely hypercarbic respiratory failure:Ejection fraction 35 to 40%. Weaned offbipap. Card was consulted. Appreciate cards consult, they continue to follow. Diuresis is limited by hypotension. 11/21 Card increased midodrine dose from 10 to 15 mg tid due to soft blood pressure; also added pseudoephedrine 60 mg every 6 hours. His Bp is better  is still soft. In/out of - 3.1 L past 24 hours   -Receivedalbumin 25g q6 x 4 doses11/12, repeat 11/1525g x 4 - changed to furosemide to 80 mg IV daily by card 11/22  -->change to 40 mg bid by card (start 11/24) -ContinueTED hose/SCDs -Added spironolactone 12.5 mg daily by Card 11/24 -Continue low-dose digoxin which was started 11/23, check level on 11/25 -ACEi/ARB/Entresto on hold secondary to hypotension  -Daily standing weights -Strict I/O --Add Toprol XL 12.5 mg daily with hold parameters of BP < 90/50 by card 11/25  Hypotension: pt has been soft, but mental status normal. No fever. Has mild leukocytosi with WBC 10.5 on 11/22, dose not seem to have sepsis now. Likely multifactorial etiology. Cortisol level normal, 11.6 . transfused 1 unit of blood 11/24. Bp 90-70 -110/73 in AM. -11/21 Card increased midodrine dose from 10 to 15 mg tid due to soft blood pressure -->changed back to 10 mg tid by card on 11/24 -->changed to 5 mg tid by card on 11/25 - Also added pseudoephedrine 60 mg every 6 hours -->d/ced 11/24   Persistent atrial fibrillation: ventricular rate reasonable in spite of discontinuation of beta-blocker. -Continue rivaroxaban, though low threshold for stopping in the setting of anemia. -added digoxin 11/23 by card --Added Toprol XL 12.5 mg daily with hold parameters of BP < 90/50 by card 11/25  Diabetes mellitus type 2, uncontrolled, with complications; - c/wsliding scale insulin,  -checkingblood glucose AC at bedtime, -diabetic diet  Dyslipidemia. -Lipitor 80 mg daily  BPH. -continue Proscar and Flomax.  Stage 4 sacrum decubiti POA; -wound care consult appreciated, continue with Aquacel, Kerlix, ABD pad and change daily, agree with repositioning every 2 hours.  11/21: CT-pelvis showed a large decubitus ulcer overlying the lower sacrum and coccyx with underlying bone resorption consistent with osteomyelitis. Suspect combination of acute on chronic  osteomyelitis. No abscess. 11/22 consulted general surgeon, Dr. Dahlia Byes, highly appreciated. No no need for debridement, but would benefit from wound vac per Dr. Dahlia Byes. WBC improved 12.2 --> 9.9 -->10.7 -->11.7 -->10.5 -->12.8-->9.0 -->16.6 Leukocytosis is worsening.   -11/23: WAC started. -on Fortaz -->change to IV Zosyn to have some anaerobic coverage  -ordered Bx on 11/18 --> no grow so far -consulted ID 11/24: per Dr. Delaine Lame, "Would recommend, diverting colostomy, wound vac and antibiotics to help it heal completely as currently there is risk of stool contamination as evidenced by e.coli in culture done at wound center". I think diverting colostomy is good idea, but it is imvasive. I will start Flexiseal due to stool incontinence now 11/25.  Normocytic anemia: Iron study result as below -check FOBT pending - 510 mg of IV Feraheme 11/22 -transfuse 1 unit of blood 11/24 --> Hgb 8.4 on 11/24 -->9.0 11/25  CBC Latest Ref Rng & Units 06/26/2019 06/25/2019 06/24/2019  WBC 4.0 - 10.5 K/uL 16.6(H) 9.0 12.8(H)  Hemoglobin 13.0 - 17.0 g/dL 9.0(L) 8.4(L) 7.7(L)  Hematocrit 39.0 - 52.0 % 29.1(L) 27.1(L) 25.4(L)  Platelets 150 - 400 K/uL 278 292 313   Iron/TIBC/Ferritin/ %Sat    Component Value Date/Time   IRON 29 (L) 06/21/2019 9628  TIBC 247 (L) 06/21/2019 0521   FERRITIN 95 06/21/2019 0521   IRONPCTSAT 12 (L) 06/21/2019 0521   DVT prophylaxis: on Xarelto Code Status: full code Family Communication: called his sister in-law 11/25 Disposition Plan: still on 1L O2, still has low Bp. Still has significant leg and thigh edema. SM/CM is working on finding SNF bed   Consultants:   Therapist, nutritional   Procedures:    Antimicrobials:  Anti-infectives (From admission, onward)   Start     Dose/Rate Route Frequency Ordered Stop   06/26/19 1800  piperacillin-tazobactam (ZOSYN) IVPB 3.375 g     3.375 g 12.5 mL/hr over 240 Minutes Intravenous Every 8 hours 06/26/19 1639      06/17/19 0830  cefTAZidime (FORTAZ) 1 g in sodium chloride 0.9 % 100 mL IVPB  Status:  Discontinued     1 g 200 mL/hr over 30 Minutes Intravenous Every 8 hours 06/17/19 0818 06/26/19 1635         Objective: Vitals:   06/26/19 0525 06/26/19 0746 06/26/19 1417 06/26/19 1544  BP: 110/73 98/71 104/66 92/63  Pulse: (!) 110 (!) 104 84 87  Resp: 17  18   Temp: 97.6 F (36.4 C)  97.7 F (36.5 C) (!) 97.5 F (36.4 C)  TempSrc: Oral  Oral Oral  SpO2: 99% 97% 100% 100%  Weight:      Height:        Intake/Output Summary (Last 24 hours) at 06/26/2019 1647 Last data filed at 06/26/2019 0900 Gross per 24 hour  Intake 240 ml  Output 1250 ml  Net -1010 ml   Filed Weights   06/20/19 0500 06/21/19 0600 06/23/19 0500  Weight: 114.4 kg 120.6 kg 118.5 kg    Examination:  Physical Exam:  General: Not in acute distress HEENT: PERRL, EOMI, no scleral icterus, No JVD or bruit Cardiac: S1/S2, RRR, No murmurs, gallops or rubs Pulm: No rales, wheezing, rhonchi or rubs. Abd: Soft, nondistended, nontender, no rebound pain, no organomegaly, BS present Ext: has 1+ leg edema bilaterally up to thigh, buttock, 1+DP/PT pulse bilaterally Musculoskeletal: No joint deformities, erythema, or stiffness, ROM full Skin: No rashes. Has large sacral decubitus ulcer on WAC with pink  Neuro: Alert and oriented X3, cranial nerves II-XII grossly intact, moves all extremeties normally Psych: Patient is not psychotic, no suicidal or hemocidal ideation.    Data Reviewed: I have personally reviewed following labs and imaging studies  CBC: Recent Labs  Lab 06/22/19 0437 06/23/19 0529 06/24/19 0458 06/25/19 0524 06/26/19 0449  WBC 11.1* 10.5 12.8* 9.0 16.6*  NEUTROABS 8.3* 7.8* 10.5* 6.5 14.5*  HGB 7.6* 7.6* 7.7* 8.4* 9.0*  HCT 24.8* 24.9* 25.4* 27.1* 29.1*  MCV 79.7* 80.1 81.7 78.8* 80.6  PLT 317 316 313 292 258   Basic Metabolic Panel: Recent Labs  Lab 06/21/19 0521 06/22/19 0437 06/23/19 0529  06/24/19 0458 06/25/19 0524 06/26/19 0449  NA 141 140 139 140 140 140  K 4.2 4.1 3.6 3.6 3.6 4.0  CL 94* 93* 93* 97* 98 98  CO2 35* 35* 34* 31 32 31  GLUCOSE 128* 129* 113* 135* 101* 140*  BUN 26* 29* 27* 28* 23 23  CREATININE 0.88 1.07 1.00 0.96 0.87 0.78  CALCIUM 8.5* 8.6* 8.5* 8.6* 8.7* 8.9  MG 2.2 2.0 2.0 2.2 2.0  --   PHOS  --  3.7  --   --   --   --    GFR: Estimated Creatinine Clearance: 117.5 mL/min (by C-G formula  based on SCr of 0.78 mg/dL). Liver Function Tests: No results for input(s): AST, ALT, ALKPHOS, BILITOT, PROT, ALBUMIN in the last 168 hours. No results for input(s): LIPASE, AMYLASE in the last 168 hours. No results for input(s): AMMONIA in the last 168 hours. Coagulation Profile: No results for input(s): INR, PROTIME in the last 168 hours. Cardiac Enzymes: No results for input(s): CKTOTAL, CKMB, CKMBINDEX, TROPONINI in the last 168 hours. BNP (last 3 results) No results for input(s): PROBNP in the last 8760 hours. HbA1C: No results for input(s): HGBA1C in the last 72 hours. CBG: Recent Labs  Lab 06/25/19 1151 06/25/19 1718 06/25/19 2126 06/26/19 0748 06/26/19 1147  GLUCAP 102* 121* 105* 118* 123*   Lipid Profile: No results for input(s): CHOL, HDL, LDLCALC, TRIG, CHOLHDL, LDLDIRECT in the last 72 hours. Thyroid Function Tests: No results for input(s): TSH, T4TOTAL, FREET4, T3FREE, THYROIDAB in the last 72 hours. Anemia Panel: No results for input(s): VITAMINB12, FOLATE, FERRITIN, TIBC, IRON, RETICCTPCT in the last 72 hours. Sepsis Labs: No results for input(s): PROCALCITON, LATICACIDVEN in the last 168 hours.  Recent Results (from the past 240 hour(s))  CULTURE, BLOOD (ROUTINE X 2) w Reflex to ID Panel     Status: None   Collection Time: 06/19/19  8:15 AM   Specimen: BLOOD  Result Value Ref Range Status   Specimen Description BLOOD BLOOD LEFT HAND  Final   Special Requests   Final    BOTTLES DRAWN AEROBIC AND ANAEROBIC Blood Culture results  may not be optimal due to an excessive volume of blood received in culture bottles   Culture   Final    NO GROWTH 5 DAYS Performed at [facility], [address] [place], [place] [address]    Report Status 06/24/2019 FINAL  Final  CULTURE, BLOOD (ROUTINE X 2) w Reflex to ID Panel     Status: None   Collection Time: 06/19/19  9:55 AM   Specimen: BLOOD LEFT HAND  Result Value Ref Range Status   Specimen Description   Final    BLOOD LEFT HAND Performed at [facility], [place] [address], [place], [place] [address]    Special Requests   Final    BOTTLES DRAWN AEROBIC AND ANAEROBIC Blood Culture results may not be optimal due to an excessive volume of blood received in culture bottles Performed at [facility], [address], [place], [place] [address]    Culture   Final    NO GROWTH 5 DAYS Performed at [facility], [place] [address] [place], [place] [address]    Report Status 06/24/2019 FINAL  Final     RN Pressure Injury Documentation: Pressure Injury 03/18/19 Sacrum Circumferential Stage IV - Full thickness tissue loss with exposed bone, tendon or muscle. wound has evolved into stage 4 pressure injury when assessed on 9/21 (Active)  03/18/19 1930  Location: Sacrum  Location Orientation: Circumferential  Staging: Stage IV - Full thickness tissue loss with exposed bone, tendon or muscle.  Wound Description (Comments): wound has evolved into stage 4 pressure injury when assessed on 9/21  Present on Admission: Yes     Pressure Injury 04/23/19 Scrotum Left Stage II -  Partial thickness loss of dermis presenting as a shallow open ulcer with a red, pink wound bed without slough. (Active)  04/23/19 1451  Location: Scrotum  Location Orientation: Left  Staging: Stage II -  Partial thickness loss of dermis presenting as a shallow open ulcer with a red,  pink wound bed without slough.  Wound Description (Comments):   Present on Admission:      Pressure Injury  04/25/19 Heel Right Stage II -  Partial thickness loss of dermis presenting as a shallow open ulcer with a red, pink wound bed without slough. skin was black and red on assessment (Active)  04/25/19 1711  Location: Heel  Location Orientation: Right  Staging: Stage II -  Partial thickness loss of dermis presenting as a shallow open ulcer with a red, pink wound bed without slough.  Wound Description (Comments): skin was black and red on assessment  Present on Admission: Yes       Radiology Studies: No results found.      Scheduled Meds:  atorvastatin  80 mg Oral Daily   Chlorhexidine Gluconate Cloth  6 each Topical Q0600   digoxin  0.125 mg Oral Daily   feeding supplement (NEPRO CARB STEADY)  237 mL Oral BID BM   finasteride  5 mg Oral Daily   furosemide  40 mg Intravenous BID   insulin aspart  0-9 Units Subcutaneous TID WC   insulin detemir  9 Units Subcutaneous Daily   lactobacillus  1 g Oral TID WC   metoprolol succinate  12.5 mg Oral Daily   midodrine  5 mg Oral TID   multivitamin-lutein   Oral Daily   niacin  1,000 mg Oral Daily   pantoprazole  40 mg Oral Daily   rivaroxaban  20 mg Oral Daily   sodium chloride flush  10-40 mL Intracatheter Q12H   spironolactone  12.5 mg Oral Daily   tamsulosin  0.4 mg Oral QPC breakfast   vitamin C  500 mg Oral BID   Continuous Infusions:  sodium chloride 10 mL/hr at 06/25/19 2113   piperacillin-tazobactam (ZOSYN)  IV       LOS: 14 days    Time spent: 30 min     Ivor Costa, DO [facility] PAGER is on AMION  If 7PM-7AM, please contact night-coverage [email or web address] Password [facility] 06/26/2019, 4:47 PM

## 2019-06-26 NOTE — TOC Progression Note (Signed)
Transition of Care [facility]) - Progression Note    Patient Details  Name: [name] MRN: [id number] Date of Birth: [birth year]  Transition of Care [facility]) CM/SW [place], RN Phone Number: 06/26/2019, 8:58 AM  Clinical Narrative:     Notified [place] Otila Kluver) that the patient may need a wound Vac when he returns  Expected Discharge Plan: [place] Barriers to Discharge: Continued Medical Work up  Expected Discharge Plan and Services Expected Discharge Plan: [place] Choice: [place] Living arrangements for the past 2 months: [place]                                       Social Determinants of Health (SDOH) Interventions    Readmission Risk Interventions Readmission Risk Prevention Plan 06/14/2019 04/24/2019 03/11/2019  Transportation Screening Complete Complete Complete  Social Work Consult for [place] Planning/Counseling - - Complete  Palliative Care Screening - - Not Applicable  Medication Review Press photographer) Complete Complete Complete  PCP or Specialist appointment within 3-5 days of discharge Complete Complete -  [place] or [place] - (No Data) -  SW Recovery Care/Counseling Consult Complete Complete -  Palliative Care Screening Not Applicable Complete -  Skilled Nursing Facility Complete Complete -  Some recent data might be hidden

## 2019-06-26 NOTE — Care Management Important Message (Signed)
Important Message  Patient Details  Name: Colton Lamb MRN: [id number] Date of Birth: Apr 13, 1951   Medicare Important Message Given:  Yes     Juliann Pulse A Philana Younis 06/26/2019, 10:59 AM

## 2019-06-26 NOTE — Progress Notes (Signed)
Physical Therapy Treatment Patient Details Name: Colton Lamb MRN: [id number] DOB: 02-15-1951 Today's Date: 06/26/2019    History of Present Illness Chayson Charters presented to ER secondary to respiratory distress; admitted for management of acute hypoxic/hypercarbic respiratory failure (requiring BiPAP at initial presentation).  Of note, patient with large, chronic stage IV sacral decubitus ulcer.    PT Comments    Patient found in bed laying on his side, awaiting RN and CNA for bed change/wound vac change. The patient was able to perform some therapeutic exercises, demonstrated SOB with certain LE exercises, instructed in PLB. Bed mobility performed with 2+ for assistance, pt with good initiation of movement but needed modA to roll completely to allow for sheet change. RN at bedside to address wound vac. Due to this and patient reported fatigue, further mobility held, PT to follow up as able.    Follow Up Recommendations  SNF     Equipment Recommendations  None recommended by PT    Recommendations for Other Services       Precautions / Restrictions Precautions Precautions: Fall Precaution Comments: *sTg4 Sacral wound Restrictions Weight Bearing Restrictions: No    Mobility  Bed Mobility Overal bed mobility: Needs Assistance Bed Mobility: Rolling Rolling: Mod assist;+2 for physical assistance         General bed mobility comments: PT assisted with bed change and pt care, RN at bedside to replace wound vac at end of session. Pt also very fatigued/SOB with bed mobility.  Transfers                 General transfer comment: deferred  Ambulation/Gait                 Stairs             Wheelchair Mobility    Modified Rankin (Stroke Patients Only)       Balance                                            Cognition Arousal/Alertness: Awake/alert Behavior During Therapy: WFL for tasks assessed/performed Overall  Cognitive Status: Within Functional Limits for tasks assessed                                 General Comments: following simple commands well with verbal and tactile cues, is conversational      Exercises General Exercises - Lower Extremity Short Arc Quad: AROM;Both;10 reps;Strengthening Other Exercises Other Exercises: resisted elbow flexion/extension bilaterally x10, resisted hip abduction bilaterally x10    General Comments        Pertinent Vitals/Pain Pain Assessment: Faces Faces Pain Scale: Hurts a little bit Pain Descriptors / Indicators: Grimacing;Moaning Pain Intervention(s): Limited activity within patient's tolerance;Monitored during session;Repositioned    Home Living                      Prior Function            PT Goals (current goals can now be found in the care plan section) Progress towards PT goals: Progressing toward goals(slowly)    Frequency    Min 2X/week      PT Plan Current plan remains appropriate    Co-evaluation              AM-PAC PT "6 Clicks"  Mobility   Outcome Measure  Help needed turning from your back to your side while in a flat bed without using bedrails?: A Lot Help needed moving from lying on your back to sitting on the side of a flat bed without using bedrails?: A Lot Help needed moving to and from a bed to a chair (including a wheelchair)?: Total Help needed standing up from a chair using your arms (e.g., wheelchair or bedside chair)?: Total Help needed to walk in hospital room?: Total Help needed climbing 3-5 steps with a railing? : Total 6 Click Score: 8    End of Session Equipment Utilized During Treatment: Gait belt;Oxygen Activity Tolerance: Patient limited by fatigue Patient left: in bed;with call bell/phone within reach;with nursing/sitter in room Nurse Communication: Mobility status PT Visit Diagnosis: Muscle weakness (generalized) (M62.81);Difficulty in walking, not elsewhere  classified (R26.2)     Time: 3299-2426 PT Time Calculation (min) (ACUTE ONLY): 29 min  Charges:  $Therapeutic Exercise: 8-22 mins                    Lieutenant Diego PT, DPT 12:57 PM,06/26/19 [phone]

## 2019-06-26 NOTE — Consult Note (Signed)
[place] for Electrolyte Monitoring and Replacement   Kwabena Strutz is a 59 YOM with a hx of CHF, CAD, HTN, dyslipidemia, and Crohn's Disease who presented to the ED with acute onset of worsening dyspnea associated with dry cough and wheezing. He was placed on BiPAP and admitted to stepdown for further evaluation.  Pharmacy has been consulted for electrolyte monitoring and replacement.  Patient is receiving 40 mg IV Lasix q12h. He is on a carb modified diet and also getting NEPRO CARB STEADY 251m BID between meals. He is also receiving potassium chloride 433m PO daily inpatient (increased from 1060mPTA dose)  Recent Labs: Potassium (mmol/L)  Date Value  06/26/2019 4.0  11/22/2013 3.7   Magnesium (mg/dL)  Date Value  06/25/2019 2.0  11/22/2013 1.8   Calcium (mg/dL)  Date Value  06/26/2019 8.9   Calcium, Total (mg/dL)  Date Value  11/22/2013 9.0   Albumin (g/dL)  Date Value  06/12/2019 2.9 (L)  04/18/2018 4.3   Phosphorus (mg/dL)  Date Value  06/22/2019 3.7   Sodium (mmol/L)  Date Value  06/26/2019 140  05/09/2016 141  11/22/2013 133 (L)   Corrected Calcium: 10.0 mg/dL  Assessment/Plan: - K 4.0 - Scheduled KCl 17m69mose dc'ed in lieu of addition of spironolactone 12.5mg 14mly - Pt is still on IV diuresis with 17mg 30mx bid - No other electrolytes warranting replacement at this time. - Will check BMP with AM labs one more day per change in daily therapy  Goals of Therapy:  - K ~ 4 - Mg ~ 2 - Other electrolytes WNL   Thank you for allowing pharmacy to be a part of this patient's care.   CharleLu DuffelmD, BCPS Clinical Pharmacist 06/26/2019 7:26 AM

## 2019-06-26 NOTE — Consult Note (Signed)
Pharmacy Antibiotic Note  Colton Lamb is a 68 y.o. male admitted on 06/12/2019 with sacral ulcer osteomyelitis.  Pharmacy has been consulted for Zosyn dosing. Patient has previously been on Ceftazidime since 11/16 but has had minimal improvement on wound and worsening leukocytosis. Per hospitalist, will change to Zosyn to include anaerobic coverage.  Plan: Zosyn 3.375g IV Q8 hours  Height: 6' (182.9 cm) Weight: 261 lb 3.9 oz (118.5 kg) IBW/kg (Calculated) : 77.6  Temp (24hrs), Avg:97.6 F (36.4 C), Min:97.5 F (36.4 C), Max:97.7 F (36.5 C)  Recent Labs  Lab 06/22/19 0437 06/23/19 0529 06/24/19 0458 06/25/19 0524 06/26/19 0449  WBC 11.1* 10.5 12.8* 9.0 16.6*  CREATININE 1.07 1.00 0.96 0.87 0.78    Estimated Creatinine Clearance: 117.5 mL/min (by C-G formula based on SCr of 0.78 mg/dL).    Allergies  Allergen Reactions  . Iodine Other (See Comments)    Shortness of breath, swelling and hives  . Shrimp [Shellfish Allergy] Other (See Comments)    SWELLING    HIVES    SHORTNESS OF BREATH  . Tetracycline Rash    Antimicrobials this admission: Zosyn 11/25 >>  Tressie Ellis 11/16 >> 11/25   Microbiology results: 11/18 BCx: NGF 11/11 MRSA PCR: negative  Thank you for allowing pharmacy to be a part of this patient's care.  Pearla Dubonnet 06/26/2019 5:44 PM

## 2019-06-27 DIAGNOSIS — Z95810 Presence of automatic (implantable) cardiac defibrillator: Secondary | ICD-10-CM

## 2019-06-27 DIAGNOSIS — D649 Anemia, unspecified: Secondary | ICD-10-CM | POA: Diagnosis not present

## 2019-06-27 DIAGNOSIS — I4811 Longstanding persistent atrial fibrillation: Secondary | ICD-10-CM | POA: Diagnosis not present

## 2019-06-27 DIAGNOSIS — J9600 Acute respiratory failure, unspecified whether with hypoxia or hypercapnia: Secondary | ICD-10-CM | POA: Diagnosis not present

## 2019-06-27 DIAGNOSIS — I5043 Acute on chronic combined systolic (congestive) and diastolic (congestive) heart failure: Secondary | ICD-10-CM | POA: Diagnosis not present

## 2019-06-27 LAB — CBC WITH DIFFERENTIAL/PLATELET
Abs Immature Granulocytes: 0.05 10*3/uL (ref 0.00–0.07)
Basophils Absolute: 0.1 10*3/uL (ref 0.0–0.1)
Basophils Relative: 1 %
Eosinophils Absolute: 0.5 10*3/uL (ref 0.0–0.5)
Eosinophils Relative: 5 %
HCT: 29.4 % — ABNORMAL LOW (ref 39.0–52.0)
Hemoglobin: 8.7 g/dL — ABNORMAL LOW (ref 13.0–17.0)
Immature Granulocytes: 1 %
Lymphocytes Relative: 13 %
Lymphs Abs: 1.3 10*3/uL (ref 0.7–4.0)
MCH: 24.9 pg — ABNORMAL LOW (ref 26.0–34.0)
MCHC: 29.6 g/dL — ABNORMAL LOW (ref 30.0–36.0)
MCV: 84.2 fL (ref 80.0–100.0)
Monocytes Absolute: 0.5 10*3/uL (ref 0.1–1.0)
Monocytes Relative: 5 %
Neutro Abs: 7.7 10*3/uL (ref 1.7–7.7)
Neutrophils Relative %: 75 %
Platelets: 312 10*3/uL (ref 150–400)
RBC: 3.49 MIL/uL — ABNORMAL LOW (ref 4.22–5.81)
RDW: 17.2 % — ABNORMAL HIGH (ref 11.5–15.5)
WBC: 10.2 10*3/uL (ref 4.0–10.5)
nRBC: 0 % (ref 0.0–0.2)

## 2019-06-27 LAB — BASIC METABOLIC PANEL
Anion gap: 10 (ref 5–15)
BUN: 19 mg/dL (ref 8–23)
CO2: 33 mmol/L — ABNORMAL HIGH (ref 22–32)
Calcium: 8.8 mg/dL — ABNORMAL LOW (ref 8.9–10.3)
Chloride: 99 mmol/L (ref 98–111)
Creatinine, Ser: 0.75 mg/dL (ref 0.61–1.24)
GFR calc Af Amer: >60 mL/min (ref >60)
GFR calc non Af Amer: >60 mL/min (ref >60)
Glucose, Bld: 94 mg/dL (ref 70–99)
Potassium: 3.5 mmol/L (ref 3.5–5.1)
Sodium: 142 mmol/L (ref 135–145)

## 2019-06-27 LAB — GLUCOSE, CAPILLARY
Glucose-Capillary: 87 mg/dL (ref 70–99)
Glucose-Capillary: 90 mg/dL (ref 70–99)
Glucose-Capillary: 122 mg/dL — ABNORMAL HIGH (ref 70–99)
Glucose-Capillary: 202 mg/dL — ABNORMAL HIGH (ref 70–99)

## 2019-06-27 MED ORDER — FUROSEMIDE 10 MG/ML IJ SOLN
40.0000 mg | Freq: Three times a day (TID) | INTRAMUSCULAR | Status: DC
  Administered 2019-06-27 – 2019-06-30 (×9): 40 mg via INTRAVENOUS
  Filled 2019-06-27 (×9): qty 4

## 2019-06-27 MED ORDER — POTASSIUM CHLORIDE CRYS ER 20 MEQ PO TBCR
40.0000 meq | EXTENDED_RELEASE_TABLET | Freq: Once | ORAL | Status: AC
  Administered 2019-06-27: 40 meq via ORAL
  Filled 2019-06-27: qty 2

## 2019-06-27 NOTE — Progress Notes (Signed)
PROGRESS NOTE    Colton Lamb  SLH:[id number] DOB: Nov 02, 1950 DOA: 06/12/2019 PCP: [name], MD    Brief Narrative:  Colton Lamb  is a 68 y.o. obese Caucasian male with a known history of systolic CHF, coronary artery disease hypertension dyslipidemia and Crohn's disease, presented to the emergency room with acute onset of worsening dyspnea with associated dry cough and wheezing for the last 24 hours without fever or chills.  He he admitted to orthopnea and paroxysmal nocturnal dyspnea as well as dyspnea on exertion with lower extremity edema.  No fever or chills.  No nausea or vomiting.  No dysuria, oliguria or hematuria or flank pain.  No recent exposure to COVID-19.  He was tested negative about a week ago.  Upon presentation to the emergency room, his heart rate was 102 and respiratory to 34 with pulse oximetry of 85% on CPAP and later on 100% on BiPAP.  His BNP was 731 and high-sensitivity troponin I of 34.  CBC showed leukocytosis and anemia with neutrophilia and CMP showed a CO2 of 42 with a BUN of 24 and creatinine 0.98 alk phos of 160 with otherwise normal LFTs except for albumin 2.9.  Venous blood gas with a pH 7.38 and bicarbonate of 50.9.  COVID-19 test is currently pending.  Portable chest ray showed vascular congestion and small pleural effusions with bibasal atelectasis that are new compared to prior exam.  His AICD was in place.  EKG showed atrial fibrillation with a rapid rate of 110 with left intravesicular block and prolonged QT interval with QTC of 568 MS.  In ED, the patient was given 40 mg of IV Lasix.  He will be admitted to stepdown unit for further evaluation and management.  Interim History: - 11/20: card increased midodrine dose from 10 to 15 mg tid due to soft blood pressure; also added pseudoephedrine 60 mg every 6 hours - 11/22: Bp is still soft, low, mental status normal.  -11/21: CT-pelvis showed: large decubitus ulcer overlying the lower sacrum  and coccyx with underlying bone resorption consistent with osteomyelitis. Suspect combination of acute on chronic osteomyelitis. No abscess.  - 11/22: due to persistent large decubitus ulcer consulted general surgeon, Dr. Dahlia Byes which is highly appreciated. Will place [facility]. - 11/22: will give 510 mg of IV Feraheme - 11/23: transfuse 1 unit of blood  -11/23: d/ced pseudoephedrine - 11/23 started digoxin - 11/24: consulted ID. - 11/24: 2D echo showed left ventricular ejection fraction, by visual estimation, is 20 to 25%. The left ventricle has severely decreased function. There is mildly increased left ventricular hypertrophy. - 11/25: Added Toprol XL 12.5 mg daily with hold parameters of BP < 90/50 by card - 11/25: Flexiseal - 11/25: switch Fortaz to Zosyn  Subjective: Pt dose not have CP or cough.  Has mild SOB. No fever or chills. No nausea, vomiting, AP or diarreha.  Has bilateral leg edema.  Assessment & Plan:   Principal Problem:   Acute on chronic combined systolic and diastolic CHF (congestive heart failure) (HCC) Active Problems:   CAD S/P percutaneous coronary angioplasty - multiple PCIs   Atrial fibrillation ([place])   CROHN'S DISEASE-LARGE & SMALL INTESTINE   ICD (implantable cardioverter-defibrillator) in place   Diabetes mellitus type 2, uncontrolled, with complications ([place])   Acute respiratory failure (HCC)   Anemia   Stage IV pressure ulcer of sacral region ([place])   Hypotension   Normocytic anemia   PICC (peripherally inserted central catheter) flush  Acute  on chronic systolic CHF with subsequent acute hypoxic and likely hypercarbic respiratory failure:Ejection fraction 35 to 40%. Weaned offbipap. Card was consulted. Appreciate cards consult, they continue to follow. Diuresis is limited by hypotension. 11/21 Card increased midodrine dose from 10 to 15 mg tid due to soft blood pressure; also added pseudoephedrine 60 mg every 6 hours. His Bp is better is still soft. In/out  of - 3.1 L past 24 hours   -Receivedalbumin 25g q6 x 4 doses11/12, repeat 11/1525g x 4 - changed to furosemide to 80 mg IV daily by card 11/22  -->change to 40 mg bid by card (start 11/24) -->changed to 40 mg of lasix tid by card 11/26 -ContinueTED hose/SCDs -Added spironolactone 12.5 mg daily by Card 11/24 -Continue low-dose digoxin which was started 11/23, check level on 11/25 -ACEi/ARB/Entresto on hold secondary to hypotension  -Daily standing weights -Strict I/O --Add Toprol XL 12.5 mg daily with hold parameters of BP < 90/50 by card 11/25  Hypotension: pt has been soft, but mental status normal. No fever. Has mild leukocytosi with WBC 10.5 on 11/22, dose not seem to have sepsis now. Likely multifactorial etiology. Cortisol level normal, 11.6 . transfused 1 unit of blood 11/24. Bp 92/63 - 100/69 in AM. -11/21 Card increased midodrine dose from 10 to 15 mg tid due to soft blood pressure -->changed back to 10 mg tid by card on 11/24 -->changed to 5 mg tid by card on 11/25 - Also added pseudoephedrine 60 mg every 6 hours -->d/ced 11/24   Persistent atrial fibrillation: ventricular rate reasonable in spite of discontinuation of beta-blocker. -Continue rivaroxaban -added digoxin 11/23 by card --Added Toprol XL 12.5 mg daily with hold parameters of BP < 90/50 by card 11/25  Diabetes mellitus type 2, uncontrolled, with complications; - c/wsliding scale insulin,  -checkingblood glucose AC at bedtime, -diabetic diet  Dyslipidemia. -Lipitor 80 mg daily  BPH. -continue Proscar and Flomax.  Stage 4 sacrum decubiti POA; -wound care consult appreciated, continue with Aquacel, Kerlix, ABD pad and change daily, agree with repositioning every 2 hours.  11/21: CT-pelvis showed a large decubitus ulcer overlying the lower sacrum and coccyx with underlying bone resorption consistent with osteomyelitis. Suspect combination of acute on chronic osteomyelitis. No abscess. 11/22  consulted general surgeon, Dr. Dahlia Byes, highly appreciated. No no need for debridement, but would benefit from wound vac per Dr. Dahlia Byes. WBC improved 12.2 --> 9.9 -->10.7 -->11.7 -->10.5 -->12.8-->9.0 -->16.6 -->10.2. Leukocytosis improved   -11/23: WAC started. -on Fortaz -->change to IV Zosyn to have some anaerobic coverage 11/25 -ordered Bx on 11/18 --> no grow so far -consulted ID 11/24: per Dr. Delaine Lame, "Would recommend, diverting colostomy, wound vac and antibiotics to help it heal completely as currently there is risk of stool contamination as evidenced by e.coli in culture done at wound center". I think diverting colostomy is good idea, but it is imvasive. I started Flexiseal due to loose stool contamination 11/25.  Normocytic anemia: Iron study result as below.  -check FOBT pending - 510 mg of IV Feraheme 11/22 - transfuse 1 unit of blood 11/24 --> Hgb 10.0 on 06/18/19 --> -->--> 8.7 on 11/26  CBC Latest Ref Rng & Units 06/27/2019 06/26/2019 06/25/2019  WBC 4.0 - 10.5 K/uL 10.2 16.6(H) 9.0  Hemoglobin 13.0 - 17.0 g/dL 8.7(L) 9.0(L) 8.4(L)  Hematocrit 39.0 - 52.0 % 29.4(L) 29.1(L) 27.1(L)  Platelets 150 - 400 K/uL 312 278 292   Iron/TIBC/Ferritin/ %Sat    Component Value Date/Time   IRON 29 (L)  06/21/2019 0521   TIBC 247 (L) 06/21/2019 0521   FERRITIN 95 06/21/2019 0521   IRONPCTSAT 12 (L) 06/21/2019 0521   DVT prophylaxis: on Xarelto Code Status: full code Family Communication: called his sister in-law 11/25 Disposition Plan: still on 1-2L O2, still has soft Bp. Still has significant leg and thigh edema.  Still has significant sacral ulcer with osteomyelitis, SM/CM is working on finding SNF bed   Consultants:   Card  Education officer, environmental  ID   Procedures:  WAC  Antimicrobials:  Anti-infectives (From admission, onward)   Start     Dose/Rate Route Frequency Ordered Stop   06/26/19 1800  piperacillin-tazobactam (ZOSYN) IVPB 3.375 g     3.375 g 12.5 mL/hr over 240  Minutes Intravenous Every 8 hours 06/26/19 1639     06/17/19 0830  cefTAZidime (FORTAZ) 1 g in sodium chloride 0.9 % 100 mL IVPB  Status:  Discontinued     1 g 200 mL/hr over 30 Minutes Intravenous Every 8 hours 06/17/19 0818 06/26/19 1635         Objective: Vitals:   06/26/19 2155 06/26/19 2312 06/27/19 0600 06/27/19 0748  BP: 97/65 98/66 94/67  100/69  Pulse: 82 86 88 83  Resp: 19 18    Temp: (!) 97.5 F (36.4 C) (!) 97.5 F (36.4 C)  (!) 97.5 F (36.4 C)  TempSrc: Oral Oral  Oral  SpO2: 100% 99%  100%  Weight:      Height:        Intake/Output Summary (Last 24 hours) at 06/27/2019 1720 Last data filed at 06/27/2019 1049 Gross per 24 hour  Intake 281.7 ml  Output 1750 ml  Net -1468.3 ml   Filed Weights   06/20/19 0500 06/21/19 0600 06/23/19 0500  Weight: 114.4 kg 120.6 kg 118.5 kg    Examination:  Physical Exam:  General: Not in acute distress HEENT: PERRL, EOMI, no scleral icterus, No JVD or bruit Cardiac: S1/S2, RRR, No murmurs, gallops or rubs Pulm: No rales, wheezing, rhonchi or rubs. Abd: Soft, nondistended, nontender, no rebound pain, no organomegaly, BS present Ext: has 1+ leg edema bilaterally up to thigh, buttock, 1+DP/PT pulse bilaterally Musculoskeletal: No joint deformities, erythema, or stiffness, ROM full Skin: No rashes. Has large sacral decubitus ulcer on WAC with pink  Neuro: Alert and oriented X3, cranial nerves II-XII grossly intact, moves all extremeties normally Psych: Patient is not psychotic, no suicidal or hemocidal ideation.    Data Reviewed: I have personally reviewed following labs and imaging studies  CBC: Recent Labs  Lab 06/23/19 0529 06/24/19 0458 06/25/19 0524 06/26/19 0449 06/27/19 0456  WBC 10.5 12.8* 9.0 16.6* 10.2  NEUTROABS 7.8* 10.5* 6.5 14.5* 7.7  HGB 7.6* 7.7* 8.4* 9.0* 8.7*  HCT 24.9* 25.4* 27.1* 29.1* 29.4*  MCV 80.1 81.7 78.8* 80.6 84.2  PLT 316 313 292 278 619   Basic Metabolic Panel: Recent Labs    Lab 06/21/19 0521 06/22/19 0437 06/23/19 0529 06/24/19 0458 06/25/19 0524 06/26/19 0449 06/27/19 0456  NA 141 140 139 140 140 140 142  K 4.2 4.1 3.6 3.6 3.6 4.0 3.5  CL 94* 93* 93* 97* 98 98 99  CO2 35* 35* 34* 31 32 31 33*  GLUCOSE 128* 129* 113* 135* 101* 140* 94  BUN 26* 29* 27* 28* 23 23 19   CREATININE 0.88 1.07 1.00 0.96 0.87 0.78 0.75  CALCIUM 8.5* 8.6* 8.5* 8.6* 8.7* 8.9 8.8*  MG 2.2 2.0 2.0 2.2 2.0  --   --  PHOS  --  3.7  --   --   --   --   --    GFR: Estimated Creatinine Clearance: 117.5 mL/min (by C-G formula based on SCr of 0.75 mg/dL). Liver Function Tests: No results for input(s): AST, ALT, ALKPHOS, BILITOT, PROT, ALBUMIN in the last 168 hours. No results for input(s): LIPASE, AMYLASE in the last 168 hours. No results for input(s): AMMONIA in the last 168 hours. Coagulation Profile: No results for input(s): INR, PROTIME in the last 168 hours. Cardiac Enzymes: No results for input(s): CKTOTAL, CKMB, CKMBINDEX, TROPONINI in the last 168 hours. BNP (last 3 results) No results for input(s): PROBNP in the last 8760 hours. HbA1C: No results for input(s): HGBA1C in the last 72 hours. CBG: Recent Labs  Lab 06/26/19 1656 06/26/19 2118 06/27/19 0750 06/27/19 1132 06/27/19 1647  GLUCAP 118* 101* 87 90 122*   Lipid Profile: No results for input(s): CHOL, HDL, LDLCALC, TRIG, CHOLHDL, LDLDIRECT in the last 72 hours. Thyroid Function Tests: No results for input(s): TSH, T4TOTAL, FREET4, T3FREE, THYROIDAB in the last 72 hours. Anemia Panel: No results for input(s): VITAMINB12, FOLATE, FERRITIN, TIBC, IRON, RETICCTPCT in the last 72 hours. Sepsis Labs: No results for input(s): PROCALCITON, LATICACIDVEN in the last 168 hours.  Recent Results (from the past 240 hour(s))  CULTURE, BLOOD (ROUTINE X 2) w Reflex to ID Panel     Status: None   Collection Time: 06/19/19  8:15 AM   Specimen: BLOOD  Result Value Ref Range Status   Specimen Description BLOOD BLOOD LEFT  HAND  Final   Special Requests   Final    BOTTLES DRAWN AEROBIC AND ANAEROBIC Blood Culture results may not be optimal due to an excessive volume of blood received in culture bottles   Culture   Final    NO GROWTH 5 DAYS Performed at [facility], [address] [place], [place] [address]    Report Status 06/24/2019 FINAL  Final  CULTURE, BLOOD (ROUTINE X 2) w Reflex to ID Panel     Status: None   Collection Time: 06/19/19  9:55 AM   Specimen: BLOOD LEFT HAND  Result Value Ref Range Status   Specimen Description   Final    BLOOD LEFT HAND Performed at [facility], [place] [address], [place], [place] [address]    Special Requests   Final    BOTTLES DRAWN AEROBIC AND ANAEROBIC Blood Culture results may not be optimal due to an excessive volume of blood received in culture bottles Performed at [facility], [address], [place], [place] [address]    Culture   Final    NO GROWTH 5 DAYS Performed at [facility], [place] [address], [place], [place] [address]    Report Status 06/24/2019 FINAL  Final     RN Pressure Injury Documentation: Pressure Injury 03/18/19 Sacrum Circumferential Stage IV - Full thickness tissue loss with exposed bone, tendon or muscle. wound has evolved into stage 4 pressure injury when assessed on 9/21 (Active)  03/18/19 1930  Location: Sacrum  Location Orientation: Circumferential  Staging: Stage IV - Full thickness tissue loss with exposed bone, tendon or muscle.  Wound Description (Comments): wound has evolved into stage 4 pressure injury when assessed on 9/21  Present on Admission: Yes     Pressure Injury 04/23/19 Scrotum Left Stage II -  Partial thickness loss of dermis presenting as a shallow open ulcer with a red, pink wound bed without slough. (  Active)  04/23/19 1451  Location: Scrotum  Location Orientation: Left  Staging: Stage II -  Partial thickness loss of dermis presenting as a shallow open ulcer with a red, pink  wound bed without slough.  Wound Description (Comments):   Present on Admission:      Pressure Injury 04/25/19 Heel Right Stage II -  Partial thickness loss of dermis presenting as a shallow open ulcer with a red, pink wound bed without slough. skin was black and red on assessment (Active)  04/25/19 1711  Location: Heel  Location Orientation: Right  Staging: Stage II -  Partial thickness loss of dermis presenting as a shallow open ulcer with a red, pink wound bed without slough.  Wound Description (Comments): skin was black and red on assessment  Present on Admission: Yes       Radiology Studies: No results found.      Scheduled Meds:  atorvastatin  80 mg Oral Daily   Chlorhexidine Gluconate Cloth  6 each Topical Q0600   digoxin  0.125 mg Oral Daily   feeding supplement (NEPRO CARB STEADY)  237 mL Oral BID BM   finasteride  5 mg Oral Daily   furosemide  40 mg Intravenous Q8H   insulin aspart  0-9 Units Subcutaneous TID WC   insulin detemir  9 Units Subcutaneous Daily   lactobacillus  1 g Oral TID WC   metoprolol succinate  12.5 mg Oral Daily   midodrine  5 mg Oral TID   multivitamin-lutein   Oral Daily   niacin  1,000 mg Oral Daily   pantoprazole  40 mg Oral Daily   rivaroxaban  20 mg Oral Daily   sodium chloride flush  10-40 mL Intracatheter Q12H   spironolactone  12.5 mg Oral Daily   tamsulosin  0.4 mg Oral QPC breakfast   vitamin C  500 mg Oral BID   Continuous Infusions:  sodium chloride Stopped (06/25/19 2113)   piperacillin-tazobactam (ZOSYN)  IV 3.375 g (06/27/19 1406)     LOS: 15 days    Time spent: 30 min     Ivor Costa, DO [facility] PAGER is on AMION  If 7PM-7AM, please contact night-coverage [email or web address] Password [email or web address] 06/27/2019, 5:20 PM

## 2019-06-27 NOTE — Progress Notes (Signed)
Progress Note  Patient Name: Colton Lamb Date of Encounter: 06/27/2019  Primary Cardiologist: Ida Rogue, MD  Subjective   Reports that he feels well with no complaints Wound VAC in place Continues to diurese with Lasix IV twice daily -1.2 L past 24 hours Blood pressure low 81W systolic but asymptomatic Not been out of bed, continues to lay in bed  Inpatient Medications    Scheduled Meds: . atorvastatin  80 mg Oral Daily  . Chlorhexidine Gluconate Cloth  6 each Topical Q0600  . digoxin  0.125 mg Oral Daily  . feeding supplement (NEPRO CARB STEADY)  237 mL Oral BID BM  . finasteride  5 mg Oral Daily  . furosemide  40 mg Intravenous Q8H  . insulin aspart  0-9 Units Subcutaneous TID WC  . insulin detemir  9 Units Subcutaneous Daily  . lactobacillus  1 g Oral TID WC  . metoprolol succinate  12.5 mg Oral Daily  . midodrine  5 mg Oral TID  . multivitamin-lutein   Oral Daily  . niacin  1,000 mg Oral Daily  . pantoprazole  40 mg Oral Daily  . potassium chloride  40 mEq Oral Once  . rivaroxaban  20 mg Oral Daily  . sodium chloride flush  10-40 mL Intracatheter Q12H  . spironolactone  12.5 mg Oral Daily  . tamsulosin  0.4 mg Oral QPC breakfast  . vitamin C  500 mg Oral BID   Continuous Infusions: . sodium chloride Stopped (06/25/19 2113)  . piperacillin-tazobactam (ZOSYN)  IV 3.375 g (06/27/19 0538)   PRN Meds: sodium chloride, acetaminophen, albuterol, ALPRAZolam, guaiFENesin-dextromethorphan, ondansetron (ZOFRAN) IV, sodium chloride flush, zolpidem   Vital Signs    Vitals:   06/26/19 2155 06/26/19 2312 06/27/19 0600 06/27/19 0748  BP: 97/65 98/66 94/67  100/69  Pulse: 82 86 88 83  Resp: 19 18    Temp: (!) 97.5 F (36.4 C) (!) 97.5 F (36.4 C)  (!) 97.5 F (36.4 C)  TempSrc: Oral Oral  Oral  SpO2: 100% 99%  100%  Weight:      Height:        Intake/Output Summary (Last 24 hours) at 06/27/2019 1359 Last data filed at 06/27/2019 1049 Gross per 24 hour   Intake 281.7 ml  Output 1750 ml  Net -1468.3 ml   Filed Weights   06/20/19 0500 06/21/19 0600 06/23/19 0500  Weight: 114.4 kg 120.6 kg 118.5 kg    Physical Exam   Constitutional: Alert, communicative no distress.  HENT:  Head: Grossly normal Eyes:  no discharge. No scleral icterus.  Neck: No JVD, no carotid bruits  Cardiovascular: Irregularly irregular no murmurs appreciated, 2+ pitting edema of his thighs, lateral flanks Pulmonary/Chest: Clear to auscultation bilaterally, no wheezes or rails Abdominal: Soft.  no distension.  no tenderness.  Musculoskeletal: Normal range of motion Neurological:  normal muscle tone. Coordination normal. No atrophy Skin: Skin warm and dry Psychiatric: normal affect, pleasant   Labs    Chemistry Recent Labs  Lab 06/25/19 0524 06/26/19 0449 06/27/19 0456  NA 140 140 142  K 3.6 4.0 3.5  CL 98 98 99  CO2 32 31 33*  GLUCOSE 101* 140* 94  BUN 23 23 19   CREATININE 0.87 0.78 0.75  CALCIUM 8.7* 8.9 8.8*  GFRNONAA >60 >60 >60  GFRAA >60 >60 >60  ANIONGAP 10 11 10      Hematology Recent Labs  Lab 06/25/19 0524 06/26/19 0449 06/27/19 0456  WBC 9.0 16.6* 10.2  RBC 3.44*  3.61* 3.49*  HGB 8.4* 9.0* 8.7*  HCT 27.1* 29.1* 29.4*  MCV 78.8* 80.6 84.2  MCH 24.4* 24.9* 24.9*  MCHC 31.0 30.9 29.6*  RDW 16.7* 16.9* 17.2*  PLT 292 278 312    Cardiac Enzymes  Recent Labs  Lab 06/12/19 0100 06/24/19 1258  TROPONINIHS 34* 16    Radiology    No results found.  Telemetry    Afib, 90's beats per minute personally Reviewed  Cardiac Studies   TTE 01/2019: 1. The left ventricle has moderately reduced systolic function, with an ejection fraction of 35-40%. The cavity size was normal. Left ventricular diastolic Doppler parameters are indeterminate. Probably severe hypokinesis of mid-distal anterior,  ateroseptal and apical myocardium. 2. The right ventricle has normal systolic function. The cavity was normal. There is no increase in  right ventricular wall thickness. 3. Left atrial size was moderately dilated. 4. Very poor image quality in spite of using Definity. Valves not well visualized. Consider different diagnostic testing if indicated. 5. The aortic valve was not well visualized. Aortic valve regurgitation was not assessed by color flow Doppler.  Patient Profile     68 y.o.malewith history of CAD status post PCI to LAD, LCX, OM, ischemic cardiomyopathy last EF 35 to 40%, ICD placement 2015, persistent A. fib on Xarelto, history of a flutter status post cardioversion in 2005, status post ablation 2015 being seen due to worsening edema.   Assessment & Plan    Acute on chronic diastolic and systolic CHF Ejection fraction 35 to 40% To date -21 L Still with significant lower extremity edema thighs, buttock, flanks -Clearly has cardiorenal syndrome, renal function has dramatically improved with diuresis now down to his baseline -Would continue Lasix 40 IV increase up to every 8 hours  Hypotension on midodrine 3 times daily,  Likely secondary to Poor nutrition, hypoalbuminemia, over 20 pound weight loss over the past year likely contributing,  sacral decub wound, osteomyelitis ----on antibiotics --He may benefit from thigh-high compressions, currently with knee-high compressions in place  Decub ulcer Large decubitus ulcer with underlying bone resorption consistent with osteomyelitis. acute on chronic osteomyelitis. -Has wound VAC in place --Rectal tube in place given chronic contamination  Persistent atrial fibrillation Rate controlled Blood pressure too low to add beta-blockers on Xarelto,  On digoxin  Anemia Had transfusion of blood Stable  Discussed with medicine service  Total encounter time more than 25 minutes  Greater than 50% was spent in counseling and coordination of care with the patient  Signed, Ida Rogue, MD  06/27/2019, 1:59 PM    For questions or updates, please contact    Please consult [email or web address] for contact info under Cardiology/STEMI.

## 2019-06-27 NOTE — Consult Note (Signed)
[facility] for Electrolyte Monitoring and Replacement   Colton Lamb is a 80 YOM with a hx of CHF, CAD, HTN, dyslipidemia, and Crohn's Disease who presented to the ED with acute onset of worsening dyspnea associated with dry cough and wheezing. He was placed on BiPAP and admitted to stepdown for further evaluation.  Pharmacy has been consulted for electrolyte monitoring and replacement.  Patient is receiving 40 mg IV Lasix q12h. He is on a carb modified diet and also getting NEPRO CARB STEADY 240m BID between meals  Recent Labs: Potassium (mmol/L)  Date Value  06/27/2019 3.5  11/22/2013 3.7   Magnesium (mg/dL)  Date Value  06/25/2019 2.0  11/22/2013 1.8   Calcium (mg/dL)  Date Value  06/27/2019 8.8 (L)   Calcium, Total (mg/dL)  Date Value  11/22/2013 9.0   Albumin (g/dL)  Date Value  06/12/2019 2.9 (L)  04/18/2018 4.3   Phosphorus (mg/dL)  Date Value  06/22/2019 3.7   Sodium (mmol/L)  Date Value  06/27/2019 142  05/09/2016 141  11/22/2013 133 (L)   Corrected Calcium: 9.7 mg/dL  Assessment/Plan: - Pt is still on IV diuresis with 422mlasix bid - spironolactone started 11/24 - replace potassium with 40 mEq oral KCl x 1 - Will check BMP with AM labs   Goals of Therapy:  - K ~ 4 - Mg ~ 2 - Other electrolytes WNL   Thank you for allowing pharmacy to be a part of this patient's care.   RoDallie PilesPharmD, BCPS Clinical Pharmacist 06/27/2019 9:14 AM

## 2019-06-28 DIAGNOSIS — I4821 Permanent atrial fibrillation: Secondary | ICD-10-CM

## 2019-06-28 DIAGNOSIS — D649 Anemia, unspecified: Secondary | ICD-10-CM | POA: Diagnosis not present

## 2019-06-28 DIAGNOSIS — J9601 Acute respiratory failure with hypoxia: Secondary | ICD-10-CM | POA: Diagnosis not present

## 2019-06-28 DIAGNOSIS — E118 Type 2 diabetes mellitus with unspecified complications: Secondary | ICD-10-CM | POA: Diagnosis not present

## 2019-06-28 DIAGNOSIS — I5043 Acute on chronic combined systolic (congestive) and diastolic (congestive) heart failure: Secondary | ICD-10-CM | POA: Diagnosis not present

## 2019-06-28 LAB — MAGNESIUM: Magnesium: 1.8 mg/dL (ref 1.7–2.4)

## 2019-06-28 LAB — CBC WITH DIFFERENTIAL/PLATELET
Abs Immature Granulocytes: 0.06 10*3/uL (ref 0.00–0.07)
Basophils Absolute: 0.1 10*3/uL (ref 0.0–0.1)
Basophils Relative: 1 %
Eosinophils Absolute: 0.5 10*3/uL (ref 0.0–0.5)
Eosinophils Relative: 5 %
HCT: 28.5 % — ABNORMAL LOW (ref 39.0–52.0)
Hemoglobin: 8.7 g/dL — ABNORMAL LOW (ref 13.0–17.0)
Immature Granulocytes: 1 %
Lymphocytes Relative: 11 %
Lymphs Abs: 1.1 10*3/uL (ref 0.7–4.0)
MCH: 24.8 pg — ABNORMAL LOW (ref 26.0–34.0)
MCHC: 30.5 g/dL (ref 30.0–36.0)
MCV: 81.2 fL (ref 80.0–100.0)
Monocytes Absolute: 0.5 10*3/uL (ref 0.1–1.0)
Monocytes Relative: 5 %
Neutro Abs: 8 10*3/uL — ABNORMAL HIGH (ref 1.7–7.7)
Neutrophils Relative %: 77 %
Platelets: 271 10*3/uL (ref 150–400)
RBC: 3.51 MIL/uL — ABNORMAL LOW (ref 4.22–5.81)
RDW: 17.7 % — ABNORMAL HIGH (ref 11.5–15.5)
WBC: 10.2 10*3/uL (ref 4.0–10.5)
nRBC: 0 % (ref 0.0–0.2)

## 2019-06-28 LAB — GLUCOSE, CAPILLARY
Glucose-Capillary: 108 mg/dL — ABNORMAL HIGH (ref 70–99)
Glucose-Capillary: 125 mg/dL — ABNORMAL HIGH (ref 70–99)
Glucose-Capillary: 162 mg/dL — ABNORMAL HIGH (ref 70–99)
Glucose-Capillary: 160 mg/dL — ABNORMAL HIGH (ref 70–99)

## 2019-06-28 LAB — BASIC METABOLIC PANEL
Anion gap: 10 (ref 5–15)
BUN: 19 mg/dL (ref 8–23)
CO2: 33 mmol/L — ABNORMAL HIGH (ref 22–32)
Calcium: 8.5 mg/dL — ABNORMAL LOW (ref 8.9–10.3)
Chloride: 98 mmol/L (ref 98–111)
Creatinine, Ser: 0.73 mg/dL (ref 0.61–1.24)
GFR calc Af Amer: >60 mL/min (ref >60)
GFR calc non Af Amer: >60 mL/min (ref >60)
Glucose, Bld: 115 mg/dL — ABNORMAL HIGH (ref 70–99)
Potassium: 3.3 mmol/L — ABNORMAL LOW (ref 3.5–5.1)
Sodium: 141 mmol/L (ref 135–145)

## 2019-06-28 MED ORDER — MAGNESIUM SULFATE 2 GM/50ML IV SOLN
2.0000 g | Freq: Once | INTRAVENOUS | Status: AC
  Administered 2019-06-28: 2 g via INTRAVENOUS
  Filled 2019-06-28: qty 50

## 2019-06-28 MED ORDER — POTASSIUM CHLORIDE CRYS ER 20 MEQ PO TBCR
40.0000 meq | EXTENDED_RELEASE_TABLET | Freq: Once | ORAL | Status: AC
  Administered 2019-06-28: 40 meq via ORAL
  Filled 2019-06-28: qty 2

## 2019-06-28 MED ORDER — POTASSIUM CHLORIDE CRYS ER 20 MEQ PO TBCR
20.0000 meq | EXTENDED_RELEASE_TABLET | Freq: Once | ORAL | Status: AC
  Administered 2019-06-28: 20 meq via ORAL
  Filled 2019-06-28: qty 1

## 2019-06-28 NOTE — Progress Notes (Signed)
Wound nurse in with wound care and woundvac dressing changed with reports pressure area improving. Rectal tube leaking-removed and replaced. Stool softer with more texture. Prevalon boots applied. IV Zosyn continued; magnesium IV supplement given with K+ po supplements given. PICC line with both ports working properly. SSI coverage continued. Denies co's. Foley maintained with cloudy yellow urine.

## 2019-06-28 NOTE — Consult Note (Signed)
[place] Nurse wound follow up Wound type:Sacral wound, stage 4 pressure injury  NPWT (VAC) therapy in place.  Dressing change today.   Measurement:7 cm x 9 cm x 3 cm bone palpable Wound TSS:QSYP pink nongranulating Drainage (amount, consistency, odor) moderate serosanguinous  No odor Periwound:Will bridge to right trochanter  Change M/W/F  Flexiseal in place.  Stool is oozing around. NT and myself provide pericare and clean up Bedside RN is notified that rectal tube needs assessing for oozing.  She is completing med pass but verbally acknowledges need to address this.   Dressing procedure/placement/frequency:2 pieces black foam.  Intact skin protected with drape for bridge to right trochanter WOC team will follow.   Domenic Moras MSN, RN, FNP-BC CWON Wound, Ostomy, Continence Nurse Pager [phone]

## 2019-06-28 NOTE — Progress Notes (Signed)
Progress Note  Patient Name: Colton Lamb Date of Encounter: 06/28/2019  Primary Cardiologist: Ida Rogue, MD  Subjective   Wound VAC in place In bed supine No complaints IV Lasix increased up to every 8 hours yesterday in the setting of normal renal function  -1.7 L, additional several 100 in the urine bag this morning -21.8 L total  Inpatient Medications    Scheduled Meds: . atorvastatin  80 mg Oral Daily  . Chlorhexidine Gluconate Cloth  6 each Topical Q0600  . digoxin  0.125 mg Oral Daily  . feeding supplement (NEPRO CARB STEADY)  237 mL Oral BID BM  . finasteride  5 mg Oral Daily  . furosemide  40 mg Intravenous Q8H  . insulin aspart  0-9 Units Subcutaneous TID WC  . insulin detemir  9 Units Subcutaneous Daily  . lactobacillus  1 g Oral TID WC  . metoprolol succinate  12.5 mg Oral Daily  . midodrine  5 mg Oral TID  . multivitamin-lutein   Oral Daily  . niacin  1,000 mg Oral Daily  . pantoprazole  40 mg Oral Daily  . potassium chloride  20 mEq Oral Once  . rivaroxaban  20 mg Oral Daily  . sodium chloride flush  10-40 mL Intracatheter Q12H  . spironolactone  12.5 mg Oral Daily  . tamsulosin  0.4 mg Oral QPC breakfast  . vitamin C  500 mg Oral BID   Continuous Infusions: . sodium chloride Stopped (06/25/19 2113)  . piperacillin-tazobactam (ZOSYN)  IV 3.375 g (06/28/19 0615)   PRN Meds: sodium chloride, acetaminophen, albuterol, ALPRAZolam, guaiFENesin-dextromethorphan, ondansetron (ZOFRAN) IV, sodium chloride flush, zolpidem   Vital Signs    Vitals:   06/28/19 0616 06/28/19 0759 06/28/19 0936 06/28/19 1000  BP: 115/67 107/70 108/72   Pulse: 90 96 92   Resp:  19 20   Temp:  97.8 F (36.6 C) (!) 97.5 F (36.4 C)   TempSrc:  Oral Oral   SpO2:  98% 100%   Weight:    (!) (P) 145.2 kg  Height:        Intake/Output Summary (Last 24 hours) at 06/28/2019 1144 Last data filed at 06/28/2019 1017 Gross per 24 hour  Intake 260 ml  Output 1450 ml   Net -1190 ml   Filed Weights   06/21/19 0600 06/23/19 0500 06/28/19 1000  Weight: 120.6 kg 118.5 kg (!) (P) 145.2 kg    Physical Exam   Constitutional:  oriented to person, place, and time. No distress.  HENT:  Head: Grossly normal Eyes:  no discharge. No scleral icterus.  Neck: Unable to estimate JVD, no carotid bruits  Cardiovascular: Irregular irregular no murmurs appreciated 1+ pitting edema thighs, buttocks, flanks Pulmonary/Chest: Clear to auscultation bilaterally, no wheezes or rails Abdominal: Soft.  no distension.  no tenderness.  Musculoskeletal: Normal range of motion Neurological:  normal muscle tone. Coordination normal. No atrophy Skin: Skin warm and dry Psychiatric: normal affect, pleasant   Labs   Chemistry Recent Labs  Lab 06/26/19 0449 06/27/19 0456 06/28/19 0533  NA 140 142 141  K 4.0 3.5 3.3*  CL 98 99 98  CO2 31 33* 33*  GLUCOSE 140* 94 115*  BUN 23 19 19   CREATININE 0.78 0.75 0.73  CALCIUM 8.9 8.8* 8.5*  GFRNONAA >60 >60 >60  GFRAA >60 >60 >60  ANIONGAP 11 10 10      Hematology Recent Labs  Lab 06/26/19 0449 06/27/19 0456 06/28/19 0534  WBC 16.6* 10.2 10.2  RBC 3.61* 3.49* 3.51*  HGB 9.0* 8.7* 8.7*  HCT 29.1* 29.4* 28.5*  MCV 80.6 84.2 81.2  MCH 24.9* 24.9* 24.8*  MCHC 30.9 29.6* 30.5  RDW 16.9* 17.2* 17.7*  PLT 278 312 271    Cardiac Enzymes  Recent Labs  Lab 06/12/19 0100 06/24/19 1258  TROPONINIHS 34* 16    Radiology    No results found.  Telemetry    Afib, 90's beats per minute personally Reviewed  Cardiac Studies   TTE 01/2019: 1. The left ventricle has moderately reduced systolic function, with an ejection fraction of 35-40%. The cavity size was normal. Left ventricular diastolic Doppler parameters are indeterminate. Probably severe hypokinesis of mid-distal anterior,  ateroseptal and apical myocardium. 2. The right ventricle has normal systolic function. The cavity was normal. There is no increase in right  ventricular wall thickness. 3. Left atrial size was moderately dilated. 4. Very poor image quality in spite of using Definity. Valves not well visualized. Consider different diagnostic testing if indicated. 5. The aortic valve was not well visualized. Aortic valve regurgitation was not assessed by color flow Doppler.  Patient Profile     68 y.o.malewith history of CAD status post PCI to LAD, LCX, OM, ischemic cardiomyopathy last EF 35 to 40%, ICD placement 2015, persistent A. fib on Xarelto, history of a flutter status post cardioversion in 2005, status post ablation 2015 being seen due to worsening edema.   Assessment & Plan    Acute on chronic diastolic and systolic CHF Ejection fraction 35 to 40% To date -22 liters negative Cardiorenal dysfunction, renal function now down to baseline after aggressive diuresis Normal renal function this morning -Recommended we continue IV Lasix every 8 hr with close monitoring of renal function  Hypotension on midodrine 3 times daily,  Several months of poor nutrition, hypoalbuminemia, over 20 pound weight loss over the past year likely contributing,  sacral decub wound, osteomyelitis ----on antibiotics -Blood pressure has been stable on low-dose midodrine TED hose  Decub ulcer Large decubitus ulcer with underlying bone resorption consistent with osteomyelitis. acute on chronic osteomyelitis. -Has wound VAC in place --Rectal tube in place given chronic contamination Improved and stable WBC on antibiotics  Persistent atrial fibrillation Rate controlled Blood pressure too low to add beta-blockers on Xarelto,  On digoxin  Anemia Had transfusion of blood Stable   Total encounter time more than 25 minutes  Greater than 50% was spent in counseling and coordination of care with the patient   Signed, Ida Rogue, MD  06/28/2019, 11:44 AM    For questions or updates, please contact   Please consult [email or web address] for contact info  under Cardiology/STEMI.

## 2019-06-28 NOTE — Consult Note (Signed)
Pharmacy Antibiotic Note  Colton Lamb is a 68 y.o. male admitted on 06/12/2019 with sacral ulcer osteomyelitis.  Pharmacy has been consulted for Zosyn dosing. Patient has previously been on Ceftazidime since 11/16 but has had minimal improvement on wound and worsening leukocytosis. Per hospitalist, will change to Zosyn to include anaerobic coverage.  Plan: Continue Zosyn 3.375g IV Q8 hours (Extended interval dosing)  Height: 6' (182.9 cm) Weight: 261 lb 3.9 oz (118.5 kg) IBW/kg (Calculated) : 77.6  Temp (24hrs), Avg:97.7 F (36.5 C), Min:97.5 F (36.4 C), Max:97.8 F (36.6 C)  Recent Labs  Lab 06/24/19 0458 06/25/19 0524 06/26/19 0449 06/27/19 0456 06/28/19 0533 06/28/19 0534  WBC 12.8* 9.0 16.6* 10.2  --  10.2  CREATININE 0.96 0.87 0.78 0.75 0.73  --     Estimated Creatinine Clearance: 117.5 mL/min (by C-G formula based on SCr of 0.73 mg/dL).    Allergies  Allergen Reactions  . Iodine Other (See Comments)    Shortness of breath, swelling and hives  . Shrimp [Shellfish Allergy] Other (See Comments)    SWELLING    HIVES    SHORTNESS OF BREATH  . Tetracycline Rash    Antimicrobials this admission: Zosyn 11/25 >>  Tressie Ellis 11/16 >> 11/25   Microbiology results: 11/18 BCx: NGF 11/11 MRSA PCR: negative  Thank you for allowing pharmacy to be a part of this patient's care.  Lu Duffel, PharmD, BCPS Clinical Pharmacist 06/28/2019 8:45 AM

## 2019-06-28 NOTE — Care Management Important Message (Signed)
Important Message  Patient Details  Name: Colton Lamb MRN: [id number] Date of Birth: 08-11-1950   Medicare Important Message Given:  Yes     Dannette Barbara 06/28/2019, 11:04 AM

## 2019-06-28 NOTE — Consult Note (Signed)
[facility] for Electrolyte Monitoring and Replacement   Colton Lamb is a 49 YOM with a hx of CHF, CAD, HTN, dyslipidemia, and Crohn's Disease who presented to the ED with acute onset of worsening dyspnea associated with dry cough and wheezing. He was placed on BiPAP and admitted to stepdown for further evaluation.  Pharmacy has been consulted for electrolyte monitoring and replacement.  Patient is now receiving 40 mg IV Lasix q8h (increase from q12).   He is on a carb modified diet and also getting NEPRO CARB STEADY 223m BID between meals  Recent Labs: Potassium (mmol/L)  Date Value  06/27/2019 3.5  11/22/2013 3.7   Magnesium (mg/dL)  Date Value  06/28/2019 1.8  11/22/2013 1.8   Calcium (mg/dL)  Date Value  06/27/2019 8.8 (L)   Calcium, Total (mg/dL)  Date Value  11/22/2013 9.0   Albumin (g/dL)  Date Value  06/12/2019 2.9 (L)  04/18/2018 4.3   Phosphorus (mg/dL)  Date Value  06/22/2019 3.7   Sodium (mmol/L)  Date Value  06/27/2019 142  05/09/2016 141  11/22/2013 133 (L)   Corrected Calcium: 9.4 mg/dL  Assessment/Plan: - Pt is still on IV diuresis with 463mlasix tid (increase yesterday from bid) - spironolactone started 11/24  Mg = 1.8 will give IV bolus 2g Mg Sulfate x 1 K = 3.3 will give 4024mpo now and another 7m52mt lunch  - Will check BMP/Mg with AM labs   Goals of Therapy:  - K ~ 4 - Mg ~ 2 - Other electrolytes WNL   Thank you for allowing pharmacy to be a part of this patient's care.   CharLu DuffelarmD, BCPS Clinical Pharmacist 06/28/2019 7:18 AM

## 2019-06-28 NOTE — Progress Notes (Signed)
Progress Note  Patient Name: Colton Lamb Date of Encounter: 06/28/2019  Primary Cardiologist: Rockey Situ  Subjective   No chest pain.  SOB and lower extremity swelling about the same.  Wound VAC in place.  Documented urine output of 1.7 L over the past 24 hours with a net negative of 22 L for the admission.  No recent weight.  Stable renal function.  Potassium 3.3.  Hemoglobin low though stable at 8.7.  BP in the mid 90s to low 323F systolic.  Asymptomatic.  Laying in bed.  Inpatient Medications    Scheduled Meds: . atorvastatin  80 mg Oral Daily  . Chlorhexidine Gluconate Cloth  6 each Topical Q0600  . digoxin  0.125 mg Oral Daily  . feeding supplement (NEPRO CARB STEADY)  237 mL Oral BID BM  . finasteride  5 mg Oral Daily  . furosemide  40 mg Intravenous Q8H  . insulin aspart  0-9 Units Subcutaneous TID WC  . insulin detemir  9 Units Subcutaneous Daily  . lactobacillus  1 g Oral TID WC  . metoprolol succinate  12.5 mg Oral Daily  . midodrine  5 mg Oral TID  . multivitamin-lutein   Oral Daily  . niacin  1,000 mg Oral Daily  . pantoprazole  40 mg Oral Daily  . rivaroxaban  20 mg Oral Daily  . sodium chloride flush  10-40 mL Intracatheter Q12H  . spironolactone  12.5 mg Oral Daily  . tamsulosin  0.4 mg Oral QPC breakfast  . vitamin C  500 mg Oral BID   Continuous Infusions: . sodium chloride Stopped (06/25/19 2113)  . magnesium sulfate bolus IVPB    . piperacillin-tazobactam (ZOSYN)  IV 3.375 g (06/28/19 0615)   PRN Meds: sodium chloride, acetaminophen, albuterol, ALPRAZolam, guaiFENesin-dextromethorphan, ondansetron (ZOFRAN) IV, sodium chloride flush, zolpidem   Vital Signs    Vitals:   06/27/19 0748 06/27/19 2039 06/28/19 0430 06/28/19 0616  BP: 100/69 108/63 104/77 115/67  Pulse: 83 85 95 90  Resp:      Temp: (!) 97.5 F (36.4 C) 97.8 F (36.6 C) (!) 97.5 F (36.4 C)   TempSrc: Oral Oral Oral   SpO2: 100% 100% 100%   Weight:      Height:         Intake/Output Summary (Last 24 hours) at 06/28/2019 0759 Last data filed at 06/28/2019 0032 Gross per 24 hour  Intake 260 ml  Output 2000 ml  Net -1740 ml   Filed Weights   06/20/19 0500 06/21/19 0600 06/23/19 0500  Weight: 114.4 kg 120.6 kg 118.5 kg    Telemetry    Afib, 80s to 90s bpm - Personally Reviewed  ECG    No new tracings- Personally Reviewed  Physical Exam   GEN: No acute distress.   Neck: JVD difficult to assess. Cardiac: Irregularly irregular, no murmurs, rubs, or gallops.  Respiratory: Mildly diminished bilaterally to anterior exam as he is too weak to sit up or roll to the side for posterior exam.  GI: Soft, nontender, non-distended.   MS: 1+ lower extremity pitting edema to the thighs bilaterally; No deformity. Neuro:  Alert and oriented x 3; Nonfocal.  Psych: Normal affect.  Labs    Chemistry Recent Labs  Lab 06/26/19 0449 06/27/19 0456 06/28/19 0533  NA 140 142 141  K 4.0 3.5 3.3*  CL 98 99 98  CO2 31 33* 33*  GLUCOSE 140* 94 115*  BUN 23 19 19   CREATININE 0.78 0.75 0.73  CALCIUM 8.9 8.8* 8.5*  GFRNONAA >60 >60 >60  GFRAA >60 >60 >60  ANIONGAP 11 10 10      Hematology Recent Labs  Lab 06/26/19 0449 06/27/19 0456 06/28/19 0534  WBC 16.6* 10.2 10.2  RBC 3.61* 3.49* 3.51*  HGB 9.0* 8.7* 8.7*  HCT 29.1* 29.4* 28.5*  MCV 80.6 84.2 81.2  MCH 24.9* 24.9* 24.8*  MCHC 30.9 29.6* 30.5  RDW 16.9* 17.2* 17.7*  PLT 278 312 271    Cardiac EnzymesNo results for input(s): TROPONINI in the last 168 hours. No results for input(s): TROPIPOC in the last 168 hours.   BNPNo results for input(s): BNP, PROBNP in the last 168 hours.   DDimer No results for input(s): DDIMER in the last 168 hours.   Radiology    No results found.  Cardiac Studies   2D echo 01/2019: 1. The left ventricle has moderately reduced systolic function, with an ejection fraction of 35-40%. The cavity size was normal. Left ventricular diastolic Doppler parameters are  indeterminate. Probably severe hypokinesis of mid-distal anterior,  ateroseptal and apical myocardium. 2. The right ventricle has normal systolic function. The cavity was normal. There is no increase in right ventricular wall thickness. 3. Left atrial size was moderately dilated. 4. Very poor image quality in spite of using Definity. Valves not well visualized. Consider different diagnostic testing if indicated. 5. The aortic valve was not well visualized. Aortic valve regurgitation was not assessed by color flow Doppler. __________  Limited 2D echo 06/25/2019: 1. Left ventricular ejection fraction, by visual estimation, is 20 to 25%. The left ventricle has severely decreased function. There is mildly increased left ventricular hypertrophy.  2. Severely dilated left ventricular internal cavity size.  3. There is global hypokinesis with thinning/akinesis of the anterior and septal walls.  4. Global right ventricle was not well visualized.The right ventricular size is not assessed. Right vetricular wall thickness was not assessed.  5. Left atrial size was not assessed.  6. Right atrial size was not assessed.  7. Small pericardial effusion.  8. The pericardial effusion is circumferential.  9. Mild mitral annular calcification. 10. The mitral valve was not assessed. No assessment of mitral valve regurgitation. 11. The tricuspid valve is not assessed. Tricuspid valve regurgitation was not assessed. 12. Aortic valve regurgitation was not assessed. 13. The aortic valve was not assessed. Aortic valve regurgitation was not assessed. 14. The pulmonic valve was not well visualized. Pulmonic valve regurgitation was not assessed. 15. Aortic root could not be assessed. 16. The interatrial septum was not assessed.  Patient Profile     68 y.o. male with history of CAD s/p remote stenting of the LAD, LCx, PL branch s/p PCI to the OM in 2008, chronic combined CHF secondary to ICM s/p [employer] ICD in  04/2014, PAF on Xarelto, atrial flutter s/p DCCV in 03/2014 s/p ablation in 04/2014, Crohn's ileocolitis, syncope in 11/2013 in the setting of volume depletion and bradycardia due to digoxin toxicity, DM, HTN, HLD, sleep apnea not complaint with CPAP, GERD, and obesitywho we are seeing for volume overload.  Assessment & Plan    1. Acute on chronic combined systolic and diastolic CHF: -He remains volume up on exam, which is likely mildly exacerbated by 3rd spacing in the setting of anemia -ContinueTED hose/SCDs -Continue IV diuresis with Lasix 40 mg twice daily -Continue spironolactone 12.5 mg daily -Continue low-dose digoxin which was started 11/23, with a digoxin level of 0.2 on 11/25, check level in the morning -Continue Toprol XL  12.5 mg daily with hold parameters of BP < 90/50 -With his worsening cardiomyopathy, we will need to pursue ischemic evaluation as his volume status and infection improve with a R/LHC -ACEi/ARB/Entresto on hold secondary to hypotension  -Escalate evidence based therapy as able -Daily standing weights -Strict I/O  2.Persistent Afib with RVR: -He remains in Afib with ventricular rates reasonably controlled -Continue Toprol XL as above -Continue digoxin -Cannot exclude progressive infection given worsening ventricular rates and leukocytosis  -Xarelto -Has been in Afib for ~ past 12 months -Pursue rate control strategy  -Estimated Creatinine Clearance: 117.5 mL/min (by C-G formula based on SCr of 0.73 mg/dL).  3. CAD involving the native coronary arteries without angina/elevated HS-Tn: -Initial HS-Tn of 34, not cycled -No chest pain -On Xarelto in place of ASA -Lipitor  4. OSA: -CPAP  5. Anemia: -Stable -Likely contributing to symptoms   6. Hypokalemia: -Maintain goal of 4.0 -KCl ordered for this morning -Continue low-dose spironolactone 12.5 mg daily  7. Hypotension: -Likely multifactorial including anemia, hypoalbuminemia, deconditioning  in the setting of multiple admissions over the past year, sacral wound -Chronic issue, asymptomatic -Stable and asymptomatic -Remains on midodrine 5 mg tid, taper as able given underlying CHF    For questions or updates, please contact [facility] Please consult [email or web address] for contact info under Cardiology/STEMI.    Signed, Christell Faith, PA-C [facility] Pager: [phone] 06/28/2019, 7:59 AM

## 2019-06-28 NOTE — Progress Notes (Signed)
PROGRESS NOTE  Colton Lamb EHM:[id number] DOB: [birth year] DOA: 06/12/2019 PCP: [name], MD  Brief History   68 year old man PMH systolic CHF, Crohn's disease presented with shortness of breath.  Treated with BiPAP, Lasix and admitted for acute CHF exacerbation.   A & P  Acute hypoxic, hypercarbic respiratory failure secondary to acute on chronic systolic CHF.  Diuresis limited by hypotension. --Appears stable.  Wean oxygen as tolerated.  Cardiology following.  -21 L today.  Still has significant volume overload. --Continue Lasix  Acute on chronic systolic/diastolic CHF with associated cardiorenal syndrome.  LVEF 20-25%. --Continue diuresis with IV Lasix. --Continue digoxin, spironolactone, Toprol-XL.  ACE inhibitor/ARB/Entresto on hold secondary to hypotension.  Hypokalemia  --Mild.  Secondary to diuresis.  Replete.  Hypotension.  Thought secondary to CHF, hypoalbuminemia, deconditioning.  Cortisol normal. --Asymptomatic.  Continue midodrine  Persistent atrial fibrillation --Rate appears controlled.  Continue rivaroxaban, digoxin, Toprol-XL  Coronary artery disease --Stable.  On Xarelto in place of aspirin.  Continue Lipitor.  Diabetes mellitus type 2 --CBG stable.  Continue Levemir and sliding scale insulin.  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  ID plans to discuss with surgery.  Normocytic anemia -- Hemoglobin stable.  No further evaluation suggested.  Chronic urinary catheter --Patient reports this was placed approximately August to help with wound care, healing of wound.  For now we will continue given difficulty with wound healing.  DVT prophylaxis: rivaroxaban Code Status: Full Family Communication: none Disposition Plan: pending    Murray Hodgkins, MD  [facility] Direct contact: see [email or web address] (further directions at bottom of  note if needed) 7PM-7AM contact night coverage as at bottom of note 06/28/2019, 7:16 AM  LOS: 16 days   Significant Hospital Events   .    Consults:  . Cardiology . Infectious disease . General surgery   Procedures:  .   Significant Diagnostic Tests:  . 11/24 echocardiogram: LVEF 20-25%.  Global hypokinesis.   Micro Data:  .    Antimicrobials:  .   Interval History/Subjective  Feels okay today.  Tolerating diet.  No complaints.  Objective   Vitals:  Vitals:   06/28/19 0430 06/28/19 0616  BP: 104/77 115/67  Pulse: 95 90  Resp:    Temp: (!) 97.5 F (36.4 C)   SpO2: 100%     Exam:  Constitutional:  Appears calm, comfortable. Cardiovascular.  Regular rate and rhythm.  No murmur, rub or gallop.  2+ thigh edema bilaterally.  Telemetry atrial fibrillation. Respiratory.  Clear to auscultation bilaterally.  No wheezes, rales or rhonchi.  Normal respiratory effort. Psychiatric.  Grossly normal mood and affect.  Speech fluent and appropriate.  I have personally reviewed the following:   Today's Data  . Urine output 2000.  -22 L since admission. . CBG stable. . Potassium 3.3.  CO2 33.  Magnesium 1.8.  Hemoglobin stable at 8.7.  WBC 10.2.  Platelets within normal limits.  Scheduled Meds: . atorvastatin  80 mg Oral Daily  . Chlorhexidine Gluconate Cloth  6 each Topical Q0600  . digoxin  0.125 mg Oral Daily  . feeding supplement (NEPRO CARB STEADY)  237 mL Oral BID BM  . finasteride  5 mg Oral Daily  . furosemide  40 mg Intravenous Q8H  . insulin aspart  0-9 Units Subcutaneous TID WC  . insulin detemir  9 Units Subcutaneous Daily  . lactobacillus  1 g Oral TID WC  .  metoprolol succinate  12.5 mg Oral Daily  . midodrine  5 mg Oral TID  . multivitamin-lutein   Oral Daily  . niacin  1,000 mg Oral Daily  . pantoprazole  40 mg Oral Daily  . rivaroxaban  20 mg Oral Daily  . sodium chloride flush  10-40 mL Intracatheter Q12H  . spironolactone  12.5 mg Oral Daily  .  tamsulosin  0.4 mg Oral QPC breakfast  . vitamin C  500 mg Oral BID   Continuous Infusions: . sodium chloride Stopped (06/25/19 2113)  . piperacillin-tazobactam (ZOSYN)  IV 3.375 g (06/28/19 0615)    Principal Problem:   Acute on chronic combined systolic and diastolic CHF (congestive heart failure) (HCC) Active Problems:   CAD S/P percutaneous coronary angioplasty - multiple PCIs   Atrial fibrillation ([place])   CROHN'S DISEASE-LARGE & SMALL INTESTINE   ICD (implantable cardioverter-defibrillator) in place   Diabetes mellitus type 2, uncontrolled, with complications ([place])   Acute respiratory failure (HCC)   Anemia   Stage IV pressure ulcer of sacral region ([place])   Hypotension   Normocytic anemia   PICC (peripherally inserted central catheter) flush   LOS: 16 days   How to contact the [facility] Attending or Consulting provider [place] or covering provider during after hours [place], for this patient?  1. Check the care team in [facility] and look for a) attending/consulting [facility] provider listed and b) the [facility] team listed 2. Log into [email or web address] and use [place] universal password to access. If you do not have the password, please contact the hospital operator. 3. Locate the [facility] provider you are looking for under [facility] and page to a number that you can be directly reached. 4. If you still have difficulty reaching the provider, please page the [facility] (Director on Call) for the Hospitalists listed on amion for assistance.

## 2019-06-29 DIAGNOSIS — I5043 Acute on chronic combined systolic (congestive) and diastolic (congestive) heart failure: Secondary | ICD-10-CM | POA: Diagnosis not present

## 2019-06-29 LAB — GLUCOSE, POCT (MANUAL RESULT ENTRY)
POC Glucose: 159 mg/dl — AB (ref 70–99)
POC Glucose: 259 mg/dl — AB (ref 70–99)

## 2019-06-29 LAB — BASIC METABOLIC PANEL
Anion gap: 15 (ref 5–15)
BUN: 17 mg/dL (ref 8–23)
CO2: 31 mmol/L (ref 22–32)
Calcium: 8 mg/dL — ABNORMAL LOW (ref 8.9–10.3)
Chloride: 95 mmol/L — ABNORMAL LOW (ref 98–111)
Creatinine, Ser: 0.79 mg/dL (ref 0.61–1.24)
GFR calc Af Amer: >60 mL/min (ref >60)
GFR calc non Af Amer: >60 mL/min (ref >60)
Glucose, Bld: 144 mg/dL — ABNORMAL HIGH (ref 70–99)
Potassium: 3.1 mmol/L — ABNORMAL LOW (ref 3.5–5.1)
Sodium: 141 mmol/L (ref 135–145)

## 2019-06-29 LAB — POTASSIUM: Potassium: 4 mmol/L (ref 3.5–5.1)

## 2019-06-29 LAB — CBC WITH DIFFERENTIAL/PLATELET
Abs Immature Granulocytes: 0.04 10*3/uL (ref 0.00–0.07)
Basophils Absolute: 0.1 10*3/uL (ref 0.0–0.1)
Basophils Relative: 1 %
Eosinophils Absolute: 0.4 10*3/uL (ref 0.0–0.5)
Eosinophils Relative: 4 %
HCT: 29.9 % — ABNORMAL LOW (ref 39.0–52.0)
Hemoglobin: 9 g/dL — ABNORMAL LOW (ref 13.0–17.0)
Immature Granulocytes: 0 %
Lymphocytes Relative: 11 %
Lymphs Abs: 1.1 10*3/uL (ref 0.7–4.0)
MCH: 25.1 pg — ABNORMAL LOW (ref 26.0–34.0)
MCHC: 30.1 g/dL (ref 30.0–36.0)
MCV: 83.3 fL (ref 80.0–100.0)
Monocytes Absolute: 0.5 10*3/uL (ref 0.1–1.0)
Monocytes Relative: 4 %
Neutro Abs: 8.7 10*3/uL — ABNORMAL HIGH (ref 1.7–7.7)
Neutrophils Relative %: 80 %
Platelets: 282 10*3/uL (ref 150–400)
RBC: 3.59 MIL/uL — ABNORMAL LOW (ref 4.22–5.81)
RDW: 18.2 % — ABNORMAL HIGH (ref 11.5–15.5)
WBC: 10.8 10*3/uL — ABNORMAL HIGH (ref 4.0–10.5)
nRBC: 0 % (ref 0.0–0.2)

## 2019-06-29 LAB — MAGNESIUM: Magnesium: 1.9 mg/dL (ref 1.7–2.4)

## 2019-06-29 LAB — DIGOXIN LEVEL: Digoxin Level: 0.5 ng/mL — ABNORMAL LOW (ref 0.8–2.0)

## 2019-06-29 LAB — GLUCOSE, CAPILLARY
Glucose-Capillary: 131 mg/dL — ABNORMAL HIGH (ref 70–99)
Glucose-Capillary: 143 mg/dL — ABNORMAL HIGH (ref 70–99)
Glucose-Capillary: 159 mg/dL — ABNORMAL HIGH (ref 70–99)
Glucose-Capillary: 133 mg/dL — ABNORMAL HIGH (ref 70–99)

## 2019-06-29 MED ORDER — POTASSIUM CHLORIDE CRYS ER 20 MEQ PO TBCR
40.0000 meq | EXTENDED_RELEASE_TABLET | ORAL | Status: AC
  Administered 2019-06-29 (×2): 40 meq via ORAL
  Filled 2019-06-29 (×2): qty 2

## 2019-06-29 MED ORDER — POTASSIUM CHLORIDE CRYS ER 20 MEQ PO TBCR
20.0000 meq | EXTENDED_RELEASE_TABLET | Freq: Every day | ORAL | Status: DC
  Administered 2019-06-30 – 2019-07-09 (×9): 20 meq via ORAL
  Filled 2019-06-29 (×9): qty 1

## 2019-06-29 NOTE — Progress Notes (Signed)
Zosyn IV continued. PICC line intact and functioning. Stools more formed soft texture in rectal tubing. Foley continued. Wound vac remains intact. K+ 3.1 with oral supplementation given as ordered and will recheck later today. Denies co's/concerns.

## 2019-06-29 NOTE — Progress Notes (Signed)
Physical Therapy Treatment Patient Details Name: Colton Lamb MRN: [id number] DOB: [birth year] Today's Date: 06/29/2019    History of Present Illness Shann Merrick presented to ER secondary to respiratory distress; admitted for management of acute hypoxic/hypercarbic respiratory failure (requiring BiPAP at initial presentation).  Of note, patient with large, chronic stage IV sacral decubitus ulcer.    PT Comments    Pt was seen for movement on bed, was able to sit him up but quite weak to control sitting balance.  He is coming from SNF with a two week gap from this admission to having had active therapy.  As a result, he is no longer able to stand and cannot do more than move on the bed. However, pt is still calling return home his ultimate goal and wishes to pursue therapy.  Has a history of refusing therapy previously and so now as he is able to participate, encourage taking him through as much movement as he is willing to do.   Follow Up Recommendations  SNF     Equipment Recommendations  None recommended by PT    Recommendations for Other Services       Precautions / Restrictions Precautions Precautions: Fall Precaution Comments: sacral wound stage 4 Restrictions Weight Bearing Restrictions: No    Mobility  Bed Mobility Overal bed mobility: Needs Assistance Bed Mobility: Rolling;Supine to Sit;Sit to Supine(scooting up in bed) Rolling: Mod assist   Supine to sit: Max assist;+2 for physical assistance;+2 for safety/equipment Sit to supine: Max assist;+2 for physical assistance;+2 for safety/equipment   General bed mobility comments: pt tends to lean on L elbow to support due to core weakness and low endurance in sitting  Transfers                 General transfer comment: unable to stand  Ambulation/Gait                 Stairs             Wheelchair Mobility    Modified Rankin (Stroke Patients Only)       Balance Overall balance  assessment: Needs assistance Sitting-balance support: Feet supported;Bilateral upper extremity supported Sitting balance-Leahy Scale: Poor                                      Cognition Arousal/Alertness: Awake/alert Behavior During Therapy: WFL for tasks assessed/performed Overall Cognitive Status: Within Functional Limits for tasks assessed                                 General Comments: talked with pt about his strength changes since his gap in PT from SNF and the recent hospitalization      Exercises      General Comments General comments (skin integrity, edema, etc.): pt was assisted to sit and then back to bed, and note he can generate some effort of LE"s to help slide up in the bed, but cannot do without help      Pertinent Vitals/Pain Pain Assessment: No/denies pain(states sacral wound has never been painful)    Home Living                      Prior Function            PT Goals (current goals can now be found in  the care plan section) Acute Rehab PT Goals Patient Stated Goal: to get stronger and get home Progress towards PT goals: Progressing toward goals    Frequency    Min 2X/week      PT Plan Current plan remains appropriate    Co-evaluation              AM-PAC PT "6 Clicks" Mobility   Outcome Measure  Help needed turning from your back to your side while in a flat bed without using bedrails?: A Lot Help needed moving from lying on your back to sitting on the side of a flat bed without using bedrails?: A Lot Help needed moving to and from a bed to a chair (including a wheelchair)?: Total Help needed standing up from a chair using your arms (e.g., wheelchair or bedside chair)?: Total Help needed to walk in hospital room?: Total Help needed climbing 3-5 steps with a railing? : Total 6 Click Score: 8    End of Session Equipment Utilized During Treatment: Oxygen Activity Tolerance: Patient limited by  fatigue;Treatment limited secondary to medical complications (Comment) Patient left: in bed;with call bell/phone within reach;with bed alarm set Nurse Communication: Mobility status PT Visit Diagnosis: Muscle weakness (generalized) (M62.81);Difficulty in walking, not elsewhere classified (R26.2)     Time: 1898-4210 PT Time Calculation (min) (ACUTE ONLY): 29 min  Charges:  $Therapeutic Activity: 8-22 mins $Neuromuscular Re-education: 8-22 mins                    Ramond Dial 06/29/2019, 3:08 PM   Mee Hives, PT MS Acute Rehab Dept. Number: [place]

## 2019-06-29 NOTE — Consult Note (Signed)
[facility] for Electrolyte Monitoring and Replacement   Colton Lamb is a 26 YOM with a hx of CHF, CAD, HTN, dyslipidemia, and Crohn's Disease who presented to the ED with acute onset of worsening dyspnea associated with dry cough and wheezing. He was placed on BiPAP and admitted to stepdown for further evaluation.  Pharmacy has been consulted for electrolyte monitoring and replacement.  Patient is now receiving 40 mg IV Lasix q8h   He is on a carb modified diet and also getting NEPRO CARB STEADY 244m BID between meals  Recent Labs: Potassium (mmol/L)  Date Value  06/29/2019 4.0  11/22/2013 3.7   Magnesium (mg/dL)  Date Value  06/29/2019 1.9  11/22/2013 1.8   Calcium (mg/dL)  Date Value  06/29/2019 8.0 (L)   Calcium, Total (mg/dL)  Date Value  11/22/2013 9.0   Albumin (g/dL)  Date Value  06/12/2019 2.9 (L)  04/18/2018 4.3   Phosphorus (mg/dL)  Date Value  06/22/2019 3.7   Sodium (mmol/L)  Date Value  06/29/2019 141  05/09/2016 141  11/22/2013 133 (L)   Corrected Calcium: 9.4 mg/dL  Assessment/Plan: - Pt is still on IV diuresis with 421mlasix q8h- spironolactone, digoxin  Mg = 1.9    Scr 0.79 K = 3.1. Will order KCL PO 40 meq q2h x 2 doses, then add KCL 20 meq PO daily while on lasix  11/28@1800 : K 4.0 Will hold off on further replenishment at this time.  - Will check BMP/Mg with AM labs   Goals of Therapy:  - K ~ 4 - Mg ~ 2 - Other electrolytes WNL   Thank you for allowing pharmacy to be a part of this patient's care.   WaPearla DubonnetPharmD Clinical Pharmacist 06/29/2019 6:43 PM

## 2019-06-29 NOTE — Progress Notes (Signed)
PROGRESS NOTE  Colton Lamb KNL:[id number] DOB: March 19, 1951 DOA: 06/12/2019 PCP: [name], MD  Brief History   68 year old man PMH systolic CHF, Crohn's disease presented with shortness of breath.  Treated with BiPAP, Lasix and admitted for acute CHF exacerbation.   A & P  Acute hypoxic, hypercarbic respiratory failure secondary to acute on chronic systolic CHF.  Diuresis limited by hypotension. --Hypoxia resolved.   Acute on chronic systolic/diastolic CHF with associated cardiorenal syndrome.  LVEF 20-25%. --Continues to improve.  -22 L since admission.  Continue IV Lasix.  --Continue digoxin, spironolactone, Toprol-XL.  ACE inhibitor/ARB/Entresto on hold secondary to hypotension.  Hypokalemia  --Replete.  Magnesium within normal limits.  Hypotension.  Thought secondary to CHF, hypoalbuminemia, deconditioning.  Cortisol normal. --Asymptomatic, now normotensive.  Continue midodrine  Persistent atrial fibrillation --R rate controlled.  Continue rivaroxaban, digoxin, Toprol-XL  Coronary artery disease --Remained stable.  Continue Xarelto in place of aspirin.  Continue Lipitor.  Diabetes mellitus type 2 --CBG remains stable.  Continue Levemir and sliding scale insulin.  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  ID plans to discuss with surgery.  Normocytic anemia -- Hemoglobin stable.  No further evaluation suggested.  Chronic urinary catheter --Patient reports this was placed approximately August to help with wound care, healing of wound.  For now we will continue given difficulty with wound healing.  DVT prophylaxis: rivaroxaban Code Status: Full Family Communication: none Disposition Plan: pending    Murray Hodgkins, MD  [facility] Direct contact: see [email or web address] (further directions at bottom of note if needed) 7PM-7AM contact night coverage  as at bottom of note 06/29/2019, 2:43 PM  LOS: 17 days   Significant Hospital Events   .    Consults:  . Cardiology . Infectious disease . General surgery   Procedures:  .   Significant Diagnostic Tests:  . 11/24 echocardiogram: LVEF 20-25%.  Global hypokinesis.   Micro Data:  .    Antimicrobials:  .   Interval History/Subjective  No new issues.  Feels fine. Objective   Vitals:  Vitals:   06/29/19 0640 06/29/19 0741  BP: 123/84 121/73  Pulse: 93 96  Resp: 18 17  Temp: 97.6 F (36.4 C) 98 F (36.7 C)  SpO2: 93% 94%    Exam:  Constitutional.  Appears calm, comfortable Respiratory clear to auscultation bilaterally.  No wheezes, rales or rhonchi.  Normal respiratory effort. Cardiovascular.  Regular rate and rhythm.  No murmur, rub or gallop.  1+ bilateral lower extremity edema. Abdomen.  Soft. Psychiatric.  Grossly normal mood and affect.  Speech fluent and appropriate..  I have personally reviewed the following:   Today's Data  . Urine output 1185.  -22.6 L since admission. . CBG stable. . Potassium 3.1.  BUN/creatinine within normal limits. . Hemoglobin stable at 9.0.  Minimal leukocytosis of 10.8.  Serum digoxin level.  Scheduled Meds: . atorvastatin  80 mg Oral Daily  . Chlorhexidine Gluconate Cloth  6 each Topical Q0600  . digoxin  0.125 mg Oral Daily  . feeding supplement (NEPRO CARB STEADY)  237 mL Oral BID BM  . finasteride  5 mg Oral Daily  . furosemide  40 mg Intravenous Q8H  . insulin aspart  0-9 Units Subcutaneous TID WC  . insulin detemir  9 Units Subcutaneous Daily  . lactobacillus  1 g Oral TID WC  . metoprolol succinate  12.5 mg Oral Daily  . midodrine  5 mg Oral TID  . multivitamin-lutein   Oral Daily  . niacin  1,000 mg Oral Daily  . pantoprazole  40 mg Oral Daily  . [START ON 06/30/2019] potassium chloride  20 mEq Oral Daily  . rivaroxaban  20 mg Oral Daily  . sodium chloride flush  10-40 mL Intracatheter Q12H  . spironolactone   12.5 mg Oral Daily  . tamsulosin  0.4 mg Oral QPC breakfast  . vitamin C  500 mg Oral BID   Continuous Infusions: . sodium chloride Stopped (06/29/19 0629)  . piperacillin-tazobactam (ZOSYN)  IV 3.375 g (06/29/19 1440)    Principal Problem:   Acute on chronic combined systolic and diastolic CHF (congestive heart failure) (HCC) Active Problems:   CAD S/P percutaneous coronary angioplasty - multiple PCIs   Atrial fibrillation ([place])   CROHN'S DISEASE-LARGE & SMALL INTESTINE   ICD (implantable cardioverter-defibrillator) in place   Diabetes mellitus type 2, uncontrolled, with complications ([place])   Acute respiratory failure (HCC)   Anemia   Stage IV pressure ulcer of sacral region ([place])   Hypotension   Normocytic anemia   PICC (peripherally inserted central catheter) flush   LOS: 17 days   How to contact the [facility] Attending or Consulting provider [place] or covering provider during after hours [place], for this patient?  1. Check the care team in [facility] and look for a) attending/consulting TRH provider listed and b) the [facility] team listed 2. Log into [email or web address] and use [place]'s universal password to access. If you do not have the password, please contact the hospital operator. 3. Locate the [facility] provider you are looking for under [facility] and page to a number that you can be directly reached. 4. If you still have difficulty reaching the provider, please page the [facility] (Director on Call) for the Hospitalists listed on amion for assistance.

## 2019-06-29 NOTE — Progress Notes (Addendum)
Progress Note  Patient Name: Colton Lamb Date of Encounter: 06/29/2019  Primary Cardiologist: Ida Rogue, MD  Subjective   "I urinated 21 liters." Denies chest pain or sob or back pain. Inpatient Medications    Scheduled Meds: . atorvastatin  80 mg Oral Daily  . Chlorhexidine Gluconate Cloth  6 each Topical Q0600  . digoxin  0.125 mg Oral Daily  . feeding supplement (NEPRO CARB STEADY)  237 mL Oral BID BM  . finasteride  5 mg Oral Daily  . furosemide  40 mg Intravenous Q8H  . insulin aspart  0-9 Units Subcutaneous TID WC  . insulin detemir  9 Units Subcutaneous Daily  . lactobacillus  1 g Oral TID WC  . metoprolol succinate  12.5 mg Oral Daily  . midodrine  5 mg Oral TID  . multivitamin-lutein   Oral Daily  . niacin  1,000 mg Oral Daily  . pantoprazole  40 mg Oral Daily  . [START ON 06/30/2019] potassium chloride  20 mEq Oral Daily  . potassium chloride  40 mEq Oral Q2H  . rivaroxaban  20 mg Oral Daily  . sodium chloride flush  10-40 mL Intracatheter Q12H  . spironolactone  12.5 mg Oral Daily  . tamsulosin  0.4 mg Oral QPC breakfast  . vitamin C  500 mg Oral BID   Continuous Infusions: . sodium chloride Stopped (06/29/19 0629)  . piperacillin-tazobactam (ZOSYN)  IV 12.5 mL/hr at 06/29/19 0800   PRN Meds: sodium chloride, acetaminophen, albuterol, ALPRAZolam, guaiFENesin-dextromethorphan, ondansetron (ZOFRAN) IV, sodium chloride flush, zolpidem   Vital Signs    Vitals:   06/28/19 1548 06/29/19 0126 06/29/19 0640 06/29/19 0741  BP: 108/81 116/71 123/84 121/73  Pulse: 94 86 93 96  Resp: 19 18 18 17   Temp: 98 F (36.7 C) 97.7 F (36.5 C) 97.6 F (36.4 C) 98 F (36.7 C)  TempSrc: Oral Oral Oral Oral  SpO2: 100% 93% 93% 94%  Weight:      Height:        Intake/Output Summary (Last 24 hours) at 06/29/2019 0835 Last data filed at 06/29/2019 5462 Gross per 24 hour  Intake 808.63 ml  Output 2625 ml  Net -1816.37 ml   Net Neg 23.3 L     Filed  Weights   06/21/19 0600 06/23/19 0500 06/28/19 1000  Weight: 120.6 kg 118.5 kg (!) 145.2 kg    Physical Exam   Constitutional:  oriented to person, place, and time. No distress.  HENT:  Head: Grossly normal Eyes:  no discharge. No scleral icterus.  Neck: Unable to estimate JVP Cardiovascular: Irregular irregular no murmurs appreciated trace edema at the ankles Pulmonary/Chest: Clear to auscultation bilaterally, no wheezes or rails Abdominal: Soft.  no distension.  no tenderness.  Musculoskeletal: Normal range of motion Neurological:  normal muscle tone. Coordination normal. No atrophy Skin: Skin warm and dry Psychiatric: normal affect, pleasant   Labs   Chemistry Recent Labs  Lab 06/27/19 0456 06/28/19 0533 06/29/19 0516  NA 142 141 141  K 3.5 3.3* 3.1*  CL 99 98 95*  CO2 33* 33* 31  GLUCOSE 94 115* 144*  BUN 19 19 17   CREATININE 0.75 0.73 0.79  CALCIUM 8.8* 8.5* 8.0*  GFRNONAA >60 >60 >60  GFRAA >60 >60 >60  ANIONGAP 10 10 15      Hematology Recent Labs  Lab 06/27/19 0456 06/28/19 0534 06/29/19 0516  WBC 10.2 10.2 10.8*  RBC 3.49* 3.51* 3.59*  HGB 8.7* 8.7* 9.0*  HCT 29.4*  28.5* 29.9*  MCV 84.2 81.2 83.3  MCH 24.9* 24.8* 25.1*  MCHC 29.6* 30.5 30.1  RDW 17.2* 17.7* 18.2*  PLT 312 271 282    Cardiac Enzymes  Recent Labs  Lab 06/12/19 0100 06/24/19 1258  TROPONINIHS 34* 16    Radiology    No results found.  Telemetry    Afib, 90's beats per minute personally Reviewed  Cardiac Studies   TTE 01/2019: 1. The left ventricle has moderately reduced systolic function, with an ejection fraction of 35-40%. The cavity size was normal. Left ventricular diastolic Doppler parameters are indeterminate. Probably severe hypokinesis of mid-distal anterior,  ateroseptal and apical myocardium. 2. The right ventricle has normal systolic function. The cavity was normal. There is no increase in right ventricular wall thickness. 3. Left atrial size was  moderately dilated. 4. Very poor image quality in spite of using Definity. Valves not well visualized. Consider different diagnostic testing if indicated. 5. The aortic valve was not well visualized. Aortic valve regurgitation was not assessed by color flow Doppler.  Patient Profile     68 y.o.malewith history of CAD status post PCI to LAD, LCX, OM, ischemic cardiomyopathy last EF 35 to 40%, ICD placement 2015, persistent A. fib on Xarelto, history of a flutter status post cardioversion in 2005, status post ablation 2015 being seen due to worsening edema.   Assessment & Plan    Acute on chronic diastolic and systolic CHF Ejection fraction 35 to 40%; he looks like he is close to euvolemia. I would switch to lasix 60 bid tomorrow.  Hypokalemia - his potassium 3.1. Please replete. I suspect he will need 80-120 meq.  Hypotension on midodrine 3 times daily,  Several months of poor nutrition, hypoalbuminemia, over 20 pound weight loss over the past year likely contributing,   -Blood pressure has been stable on low-dose midodrine TED hose   Persistent atrial fibrillation Rate controlled Blood pressure too low to add beta-blockers  On digoxin on Xarelto,   Anemia S/p transfusion of blood Stable  CAD  He denies anginal symptoms. No change.    Disp. - he is close to being ready for DC home. He will need IV anti-biotics and wound care.  Signed, Cristopher Peru, MD  06/29/2019, 8:35 AM    For questions or updates, please contact   Please consult [email or web address] for contact info under Cardiology/STEMI.

## 2019-06-29 NOTE — Progress Notes (Signed)
PROGRESS NOTE  Colton Lamb DPO:[id number] DOB: 06-02-1951 DOA: 06/12/2019 PCP: [name], MD  Brief History   68 year old man PMH systolic CHF, Crohn's disease presented with shortness of breath.  Treated with BiPAP, Lasix and admitted for acute CHF exacerbation.  Treated with aggressive diuresis but this was limited by low blood pressure, also limiting evidence-based heart failure medications.  Managed closely by cardiology.  Has had tremendous diuresis although still has some volume overload.  Eventual plans for right and left heart catheterization but only after volume status is optimized and sacral decubitus is stable.  Other issues sacral decubitus which has chronic osteomyelitis.  Seen by ID with recommendation for diverting colostomy, antibiotics and wound VAC.  A & P  Acute on chronic systolic/diastolic CHF with associated cardiorenal syndrome.  LVEF 20-25%. --Continues to improve.  -22 L since admission.  Continue IV Lasix.  --Continue digoxin, spironolactone, Toprol-XL.  ACE inhibitor/ARB/Entresto on hold secondary to hypotension. --Cardiology recommends right and left heart catheterization in the future to assess worsening cardiomyopathy, however this is deferred until volume status is optimized and sacral decubitus has healed.  Hypokalemia  --Replete.  Magnesium within normal limits.  Hypotension.  Thought secondary to CHF, hypoalbuminemia, deconditioning. Cortisol normal. --Asymptomatic, now normotensive.  Continue midodrine, thigh-high compression hose. --Goal BP 90/50 as long as symptoms of hypotension did not develop.  Persistent atrial fibrillation --rate controlled.  Continue rivaroxaban, digoxin, Toprol-XL  Coronary artery disease --Remained stable.  Continue Xarelto in place of aspirin.  Continue Lipitor.  Diabetes mellitus type 2 --CBG remains stable.  Continue Levemir and sliding scale insulin.  Stage IV sacral decubiti present on admission, with  chronic osteomyelitis.  Since August has been in a nursing home or in the hospital, mostly bedbound.  Severely deconditioned. --Continue Zosyn per infectious disease.  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  ID plans to discuss with surgery.  Normocytic anemia -- Hemoglobin stable.  No further evaluation suggested.  Chronic urinary catheter --Patient reports this was placed approximately August to help with wound care, healing of wound.  For now we will continue given difficulty with wound healing.  Obesity unspecified  Acute hypoxic, hypercarbic respiratory failure secondary to acute on chronic systolic CHF.  Diuresis limited by hypotension. --Hypoxia resolved.   DVT prophylaxis: rivaroxaban Code Status: Full Family Communication: none Disposition Plan: pending    Murray Hodgkins, MD  [facility] Direct contact: see [email or web address] (further directions at bottom of note if needed) 7PM-7AM contact night coverage as at bottom of note 06/29/2019, 4:46 PM  LOS: 17 days   Significant Hospital Events    11/11 evaluated for acute toxic respiratory failure secondary CHF.  11/20: card increased midodrine dose from 10 to 15 mg tid due to soft blood pressure; also added pseudoephedrine 60 mg every 6 hours  11/21: CT-pelvis showed: large decubitus ulcer overlying the lower sacrum and coccyx with underlying bone resorption consistent with osteomyelitis. Suspect combination of acute on chronic osteomyelitis. No abscess.   11/22 surgical consult.  No need for debridement.  11/23: transfuse 1 unit of blood  11/24: consulted ID.  Recommendation for diverting colostomy, wound VAC, antibiotics.   Consults:  . Cardiology . Infectious disease . General surgery   Procedures:    Significant Diagnostic Tests:  . 11/24 echocardiogram: LVEF 20-25%.  Global hypokinesis.   Micro Data:  .    Antimicrobials:  .   Interval History/Subjective  No new issues.  Feels fine. Objective    Vitals:  Vitals:   06/29/19 0741  06/29/19 1501  BP: 121/73 124/73  Pulse: 96 93  Resp: 17 19  Temp: 98 F (36.7 C) 97.6 F (36.4 C)  SpO2: 94% 95%    Exam:  Constitutional.  Appears calm, comfortable Respiratory clear to auscultation bilaterally.  No wheezes, rales or rhonchi.  Normal respiratory effort. Cardiovascular.  Regular rate and rhythm.  No murmur, rub or gallop.  1+ bilateral lower extremity edema. Abdomen.  Soft. Psychiatric.  Grossly normal mood and affect.  Speech fluent and appropriate..  I have personally reviewed the following:   Today's Data  . Urine output 1185.  -22.6 L since admission. . CBG stable. . Potassium 3.1.  BUN/creatinine within normal limits. . Hemoglobin stable at 9.0.  Minimal leukocytosis of 10.8.  Serum digoxin level.  Scheduled Meds: . atorvastatin  80 mg Oral Daily  . Chlorhexidine Gluconate Cloth  6 each Topical Q0600  . digoxin  0.125 mg Oral Daily  . feeding supplement (NEPRO CARB STEADY)  237 mL Oral BID BM  . finasteride  5 mg Oral Daily  . furosemide  40 mg Intravenous Q8H  . insulin aspart  0-9 Units Subcutaneous TID WC  . insulin detemir  9 Units Subcutaneous Daily  . lactobacillus  1 g Oral TID WC  . metoprolol succinate  12.5 mg Oral Daily  . midodrine  5 mg Oral TID  . multivitamin-lutein   Oral Daily  . niacin  1,000 mg Oral Daily  . pantoprazole  40 mg Oral Daily  . [START ON 06/30/2019] potassium chloride  20 mEq Oral Daily  . rivaroxaban  20 mg Oral Daily  . sodium chloride flush  10-40 mL Intracatheter Q12H  . spironolactone  12.5 mg Oral Daily  . tamsulosin  0.4 mg Oral QPC breakfast  . vitamin C  500 mg Oral BID   Continuous Infusions: . sodium chloride Stopped (06/29/19 1035)  . piperacillin-tazobactam (ZOSYN)  IV 12.5 mL/hr at 06/29/19 1600    Principal Problem:   Acute on chronic combined systolic and diastolic CHF (congestive heart failure) (HCC) Active Problems:   CAD S/P percutaneous coronary  angioplasty - multiple PCIs   Atrial fibrillation ([place])   CROHN'S DISEASE-LARGE & SMALL INTESTINE   ICD (implantable cardioverter-defibrillator) in place   Diabetes mellitus type 2, uncontrolled, with complications ([place])   Acute respiratory failure (HCC)   Anemia   Stage IV pressure ulcer of sacral region ([place])   Hypotension   Normocytic anemia   PICC (peripherally inserted central catheter) flush   LOS: 17 days   How to contact the [facility] Attending or Consulting provider [place] or covering provider during after hours [place], for this patient?  1. Check the care team in [facility] and look for a) attending/consulting TRH provider listed and b) the [facility] team listed 2. Log into [email or web address] and use [place]'s universal password to access. If you do not have the password, please contact the hospital operator. 3. Locate the [facility] provider you are looking for under [facility] and page to a number that you can be directly reached. 4. If you still have difficulty reaching the provider, please page the [facility] (Director on Call) for the Hospitalists listed on amion for assistance.

## 2019-06-29 NOTE — Consult Note (Signed)
[place] for Electrolyte Monitoring and Replacement   Colton Lamb is a 58 YOM with a hx of CHF, CAD, HTN, dyslipidemia, and Crohn's Disease who presented to the ED with acute onset of worsening dyspnea associated with dry cough and wheezing. He was placed on BiPAP and admitted to stepdown for further evaluation.  Pharmacy has been consulted for electrolyte monitoring and replacement.  Patient is now receiving 40 mg IV Lasix q8h   He is on a carb modified diet and also getting NEPRO CARB STEADY 233m BID between meals  Recent Labs: Potassium (mmol/L)  Date Value  06/29/2019 3.1 (L)  11/22/2013 3.7   Magnesium (mg/dL)  Date Value  06/29/2019 1.9  11/22/2013 1.8   Calcium (mg/dL)  Date Value  06/29/2019 8.0 (L)   Calcium, Total (mg/dL)  Date Value  11/22/2013 9.0   Albumin (g/dL)  Date Value  06/12/2019 2.9 (L)  04/18/2018 4.3   Phosphorus (mg/dL)  Date Value  06/22/2019 3.7   Sodium (mmol/L)  Date Value  06/29/2019 141  05/09/2016 141  11/22/2013 133 (L)   Corrected Calcium: 9.4 mg/dL  Assessment/Plan: - Pt is still on IV diuresis with 456mlasix q8h- spironolactone, digoxin  Mg = 1.9    Scr 0.79 K = 3.1. Will order KCL PO 40 meq q2h x 2 doses, then add KCL 20 meq PO daily while on lasix  - Will check BMP/Mg with AM labs   Goals of Therapy:  - K ~ 4 - Mg ~ 2 - Other electrolytes WNL   Thank you for allowing pharmacy to be a part of this patient's care.   MeNoralee SpacePharmD, BCPS Clinical Pharmacist 06/29/2019 7:39 AM

## 2019-06-30 ENCOUNTER — Encounter: Payer: Self-pay | Admitting: Family Medicine

## 2019-06-30 DIAGNOSIS — M8660 Other chronic osteomyelitis, unspecified site: Secondary | ICD-10-CM

## 2019-06-30 HISTORY — DX: Other chronic osteomyelitis, unspecified site: M86.60

## 2019-06-30 LAB — BASIC METABOLIC PANEL
Anion gap: 14 (ref 5–15)
BUN: 18 mg/dL (ref 8–23)
CO2: 28 mmol/L (ref 22–32)
Calcium: 8.6 mg/dL — ABNORMAL LOW (ref 8.9–10.3)
Chloride: 98 mmol/L (ref 98–111)
Creatinine, Ser: 0.83 mg/dL (ref 0.61–1.24)
GFR calc Af Amer: >60 mL/min (ref >60)
GFR calc non Af Amer: >60 mL/min (ref >60)
Glucose, Bld: 140 mg/dL — ABNORMAL HIGH (ref 70–99)
Potassium: 3.6 mmol/L (ref 3.5–5.1)
Sodium: 140 mmol/L (ref 135–145)

## 2019-06-30 LAB — GLUCOSE, CAPILLARY
Glucose-Capillary: 111 mg/dL — ABNORMAL HIGH (ref 70–99)
Glucose-Capillary: 130 mg/dL — ABNORMAL HIGH (ref 70–99)
Glucose-Capillary: 129 mg/dL — ABNORMAL HIGH (ref 70–99)
Glucose-Capillary: 126 mg/dL — ABNORMAL HIGH (ref 70–99)

## 2019-06-30 LAB — CBC WITH DIFFERENTIAL/PLATELET
Abs Immature Granulocytes: 0.08 10*3/uL — ABNORMAL HIGH (ref 0.00–0.07)
Basophils Absolute: 0.1 10*3/uL (ref 0.0–0.1)
Basophils Relative: 1 %
Eosinophils Absolute: 0.6 10*3/uL — ABNORMAL HIGH (ref 0.0–0.5)
Eosinophils Relative: 5 %
HCT: 30.8 % — ABNORMAL LOW (ref 39.0–52.0)
Hemoglobin: 9.5 g/dL — ABNORMAL LOW (ref 13.0–17.0)
Immature Granulocytes: 1 %
Lymphocytes Relative: 10 %
Lymphs Abs: 1.2 10*3/uL (ref 0.7–4.0)
MCH: 25.1 pg — ABNORMAL LOW (ref 26.0–34.0)
MCHC: 30.8 g/dL (ref 30.0–36.0)
MCV: 81.3 fL (ref 80.0–100.0)
Monocytes Absolute: 0.6 10*3/uL (ref 0.1–1.0)
Monocytes Relative: 5 %
Neutro Abs: 9.7 10*3/uL — ABNORMAL HIGH (ref 1.7–7.7)
Neutrophils Relative %: 78 %
Platelets: 280 10*3/uL (ref 150–400)
RBC: 3.79 MIL/uL — ABNORMAL LOW (ref 4.22–5.81)
RDW: 18.6 % — ABNORMAL HIGH (ref 11.5–15.5)
WBC: 12.3 10*3/uL — ABNORMAL HIGH (ref 4.0–10.5)
nRBC: 0 % (ref 0.0–0.2)

## 2019-06-30 LAB — GLUCOSE, POCT (MANUAL RESULT ENTRY): POC Glucose: 122 mg/dl — AB (ref 70–99)

## 2019-06-30 LAB — MAGNESIUM: Magnesium: 1.9 mg/dL (ref 1.7–2.4)

## 2019-06-30 MED ORDER — METOPROLOL SUCCINATE ER 50 MG PO TB24
25.0000 mg | ORAL_TABLET | Freq: Every day | ORAL | Status: DC
  Administered 2019-07-01 – 2019-07-03 (×3): 25 mg via ORAL
  Filled 2019-06-30 (×3): qty 1

## 2019-06-30 MED ORDER — FUROSEMIDE 20 MG PO TABS
60.0000 mg | ORAL_TABLET | Freq: Two times a day (BID) | ORAL | Status: DC
  Administered 2019-06-30 – 2019-07-05 (×6): 60 mg via ORAL
  Filled 2019-06-30 (×6): qty 3

## 2019-06-30 NOTE — Progress Notes (Signed)
PROGRESS NOTE  Colton Lamb IWP:[id number] DOB: 19-Oct-1950 DOA: 06/12/2019 PCP: [name], MD  Brief History   68 year old man PMH systolic CHF, Crohn's disease presented with shortness of breath.  Treated with BiPAP, Lasix and admitted for acute CHF exacerbation.  Treated with aggressive diuresis but this was limited by low blood pressure, also limiting evidence-based heart failure medications.  Managed closely by cardiology.  Has had tremendous diuresis although still has some volume overload.  Eventual plans for right and left heart catheterization but only after volume status is optimized and sacral decubitus is stable.  Other issues sacral decubitus which has chronic osteomyelitis.  Seen by ID with recommendation for diverting colostomy, antibiotics and wound VAC.  A & P  Acute on chronic systolic/diastolic CHF with associated cardiorenal syndrome.  LVEF 20-25%. --Much improved.  -22 L since admission.  Appears to be near euvolemia.  Change to oral Lasix.  Continue digoxin, spironolactone, Toprol-XL. --Consider resuming ACE inhibitor/ARB in the near future --Cardiology recommends right and left heart catheterization in the future to assess worsening cardiomyopathy, however this is deferred until volume status is optimized and sacral decubitus has healed.  Hypokalemia  --Resolved with repletion.  Magnesium within normal limits.  Hypotension.  Thought secondary to CHF, hypoalbuminemia, deconditioning. Cortisol normal. --Remained stable, lower limits of normal.   --Continue midodrine, thigh-high compression hose. --Continue BP 90/50 as long as symptoms of hypotension do not develop.  Persistent atrial fibrillation --Rate borderline controlled.  Continue digoxin.  Increase Toprol to XL.  Continue rivaroxaban.    Coronary artery disease --Stable.  Continue Xarelto in place of aspirin.  Continue Lipitor.  Diabetes mellitus type 2 --CBG remains stable.  Continue Levemir and  sliding scale insulin.  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  Since August has been in a nursing home or in the hospital, mostly bedbound.  Severely deconditioned. --Continue Zosyn per infectious disease.  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  Patient open to discussing with surgery.  We will ask surgeon to see tomorrow.  Normocytic anemia -- Hemoglobin remained stable.  No further evaluation suggested.  Chronic urinary catheter --Patient reports this was placed approximately August to help with wound care, healing of wound.  For now we will continue given difficulty with wound healing.  Obesity unspecified  Acute hypoxic, hypercarbic respiratory failure secondary to acute on chronic systolic CHF.  Diuresis limited by hypotension. --Hypoxia resolved.   DVT prophylaxis: rivaroxaban Code Status: Full Family Communication: none Disposition Plan: pending    Murray Hodgkins, MD  [facility] Direct contact: see [email or web address] (further directions at bottom of note if needed) 7PM-7AM contact night coverage as at bottom of note 06/30/2019, 9:26 AM  LOS: 18 days   Significant Hospital Events    11/11 evaluated for acute toxic respiratory failure secondary CHF.  11/20: card increased midodrine dose from 10 to 15 mg tid due to soft blood pressure; also added pseudoephedrine 60 mg every 6 hours  11/21: CT-pelvis showed: large decubitus ulcer overlying the lower sacrum and coccyx with underlying bone resorption consistent with osteomyelitis. Suspect combination of acute on chronic osteomyelitis. No abscess.   11/22 surgical consult.  No need for debridement.  11/23: transfuse 1 unit of blood  11/24: consulted ID.  Recommendation for diverting colostomy, wound VAC, antibiotics.   Consults:  . Cardiology . Infectious disease . General surgery   Procedures:    Significant Diagnostic Tests:  . 11/24 echocardiogram: LVEF 20-25%.  Global  hypokinesis.   Micro Data:  .  Antimicrobials:  .   Interval History/Subjective  No complaints.  No pain.  Feeling well.  Tolerating diet.  Objective   Vitals:  Vitals:   06/30/19 0543 06/30/19 0823  BP: 129/80 105/76  Pulse:  96  Resp:  17  Temp:  (!) 97.5 F (36.4 C)  SpO2:  100%    Exam:  Constitutional.  Appears calm, comfortable. Respiratory.  Clear to auscultation bilaterally.  No wheezes, rales or rhonchi.  Normal respiratory effort. Cardiovascular.  Regular, borderline tachycardic, no rub or gallop.  No significant lower extremity edema. Abdomen soft. Psychiatric.  Grossly normal mood and affect.  Speech fluent and appropriate.  I have personally reviewed the following:   Today's Data  . Urine output 1800.  -22.3 L. . CBG stable.  BMP unremarkable.  Magnesium within normal limits 1.9. . Hemoglobin stable at 9.5.  WBC modestly elevated at 12.3.  Scheduled Meds: . atorvastatin  80 mg Oral Daily  . Chlorhexidine Gluconate Cloth  6 each Topical Q0600  . digoxin  0.125 mg Oral Daily  . feeding supplement (NEPRO CARB STEADY)  237 mL Oral BID BM  . finasteride  5 mg Oral Daily  . furosemide  40 mg Intravenous Q8H  . insulin aspart  0-9 Units Subcutaneous TID WC  . insulin detemir  9 Units Subcutaneous Daily  . lactobacillus  1 g Oral TID WC  . metoprolol succinate  12.5 mg Oral Daily  . midodrine  5 mg Oral TID  . multivitamin-lutein   Oral Daily  . niacin  1,000 mg Oral Daily  . pantoprazole  40 mg Oral Daily  . potassium chloride  20 mEq Oral Daily  . rivaroxaban  20 mg Oral Daily  . sodium chloride flush  10-40 mL Intracatheter Q12H  . spironolactone  12.5 mg Oral Daily  . tamsulosin  0.4 mg Oral QPC breakfast  . vitamin C  500 mg Oral BID   Continuous Infusions: . sodium chloride Stopped (06/29/19 1035)  . piperacillin-tazobactam (ZOSYN)  IV 3.375 g (06/30/19 0540)    Principal Problem:   Acute on chronic combined systolic and diastolic CHF  (congestive heart failure) (HCC) Active Problems:   CAD S/P percutaneous coronary angioplasty - multiple PCIs   Atrial fibrillation ([place])   CROHN'S DISEASE-LARGE & SMALL INTESTINE   ICD (implantable cardioverter-defibrillator) in place   Diabetes mellitus type 2, uncontrolled, with complications ([place])   Acute respiratory failure (HCC)   Anemia   Stage IV pressure ulcer of sacral region ([place])   Hypotension   Normocytic anemia   PICC (peripherally inserted central catheter) flush   LOS: 18 days   How to contact the [facility] Attending or Consulting provider [place] or covering provider during after hours [place], for this patient?  1. Check the care team in [facility] and look for a) attending/consulting TR[facility] provider listed and b) the [facility] team listed 2. Log into [email or web address] and use [place]'s universal password to access. If you do not have the password, please contact the hospital operator. 3. Locate the [facility] provider you are looking for under [facility] and page to a number that you can be directly reached. 4. If you still have difficulty reaching the provider, please page the [facility] (Director on Call) for the Hospitalists listed on amion for assistance.

## 2019-06-30 NOTE — Progress Notes (Signed)
Pt quieter today; denies co's/concerns. WBC 12.3; afebrile. IV zosyn continued with dual port PICC in place. Rectal tube with soft brown stool actively draining. Foley continued with yellow,cloudy urine. UNABLE TO WEIGH PT due to malfunction of scale- Pt does not stand. Lasix being converted to po.  Wound vac continued/intact. Telemetry reports slight increase in HR/asymptomatic- MD increasing metoprolol. VSS. Continue to monitor/assess.

## 2019-06-30 NOTE — Consult Note (Signed)
[place] for Electrolyte Monitoring and Replacement   Colton Lamb is a 57 YOM with a hx of CHF, CAD, HTN, dyslipidemia, and Crohn's Disease who presented to the ED with acute onset of worsening dyspnea associated with dry cough and wheezing. He was placed on BiPAP and admitted to stepdown for further evaluation.  Pharmacy has been consulted for electrolyte monitoring and replacement.  Patient is now receiving 40 mg IV Lasix q8h   He is on a carb modified diet and also getting NEPRO CARB STEADY 226m BID between meals  Recent Labs: Potassium (mmol/L)  Date Value  06/30/2019 3.6  11/22/2013 3.7   Magnesium (mg/dL)  Date Value  06/30/2019 1.9  11/22/2013 1.8   Calcium (mg/dL)  Date Value  06/30/2019 8.6 (L)   Calcium, Total (mg/dL)  Date Value  11/22/2013 9.0   Albumin (g/dL)  Date Value  06/12/2019 2.9 (L)  04/18/2018 4.3   Phosphorus (mg/dL)  Date Value  06/22/2019 3.7   Sodium (mmol/L)  Date Value  06/30/2019 140  05/09/2016 141  11/22/2013 133 (L)   Corrected Calcium:   Goals of Therapy:  - K ~ 4 - Mg ~ 2 - Other electrolytes WNL  Assessment/Plan:  Pt 453mlasix IV q8h changed to 6023mO bid on 11/29- spironolactone, digoxin started KCL 20 meq PO daily while on lasix 11/28- may need increased dose- f/u  K 3.6  Mg 1.9 Scr 0.83 -No further replacement at this time, f/u changes in lasix dosing and KCL order - Will check BMP/Mg with AM labs    Thank you for allowing pharmacy to be a part of this patient's care.   MerNoralee SpaceharmD Clinical Pharmacist 06/30/2019 10:50 AM

## 2019-07-01 DIAGNOSIS — I5043 Acute on chronic combined systolic (congestive) and diastolic (congestive) heart failure: Secondary | ICD-10-CM | POA: Diagnosis not present

## 2019-07-01 LAB — BASIC METABOLIC PANEL
Anion gap: 13 (ref 5–15)
BUN: 19 mg/dL (ref 8–23)
CO2: 31 mmol/L (ref 22–32)
Calcium: 8.9 mg/dL (ref 8.9–10.3)
Chloride: 99 mmol/L (ref 98–111)
Creatinine, Ser: 0.7 mg/dL (ref 0.61–1.24)
GFR calc Af Amer: >60 mL/min (ref >60)
GFR calc non Af Amer: >60 mL/min (ref >60)
Glucose, Bld: 125 mg/dL — ABNORMAL HIGH (ref 70–99)
Potassium: 3.5 mmol/L (ref 3.5–5.1)
Sodium: 143 mmol/L (ref 135–145)

## 2019-07-01 LAB — GLUCOSE, CAPILLARY
Glucose-Capillary: 122 mg/dL — ABNORMAL HIGH (ref 70–99)
Glucose-Capillary: 105 mg/dL — ABNORMAL HIGH (ref 70–99)
Glucose-Capillary: 143 mg/dL — ABNORMAL HIGH (ref 70–99)
Glucose-Capillary: 136 mg/dL — ABNORMAL HIGH (ref 70–99)
Glucose-Capillary: 153 mg/dL — ABNORMAL HIGH (ref 70–99)

## 2019-07-01 LAB — CBC WITH DIFFERENTIAL/PLATELET
Abs Immature Granulocytes: 0.06 10*3/uL (ref 0.00–0.07)
Basophils Absolute: 0.1 10*3/uL (ref 0.0–0.1)
Basophils Relative: 1 %
Eosinophils Absolute: 0.6 10*3/uL — ABNORMAL HIGH (ref 0.0–0.5)
Eosinophils Relative: 5 %
HCT: 30.3 % — ABNORMAL LOW (ref 39.0–52.0)
Hemoglobin: 9.4 g/dL — ABNORMAL LOW (ref 13.0–17.0)
Immature Granulocytes: 1 %
Lymphocytes Relative: 11 %
Lymphs Abs: 1.2 10*3/uL (ref 0.7–4.0)
MCH: 25.3 pg — ABNORMAL LOW (ref 26.0–34.0)
MCHC: 31 g/dL (ref 30.0–36.0)
MCV: 81.5 fL (ref 80.0–100.0)
Monocytes Absolute: 0.5 10*3/uL (ref 0.1–1.0)
Monocytes Relative: 5 %
Neutro Abs: 8.8 10*3/uL — ABNORMAL HIGH (ref 1.7–7.7)
Neutrophils Relative %: 77 %
Platelets: 284 10*3/uL (ref 150–400)
RBC: 3.72 MIL/uL — ABNORMAL LOW (ref 4.22–5.81)
RDW: 18.9 % — ABNORMAL HIGH (ref 11.5–15.5)
WBC: 11.2 10*3/uL — ABNORMAL HIGH (ref 4.0–10.5)
nRBC: 0 % (ref 0.0–0.2)

## 2019-07-01 MED ORDER — POTASSIUM CHLORIDE CRYS ER 20 MEQ PO TBCR
20.0000 meq | EXTENDED_RELEASE_TABLET | Freq: Once | ORAL | Status: AC
  Administered 2019-07-01: 20 meq via ORAL

## 2019-07-01 NOTE — Progress Notes (Signed)
OT Cancellation Note  Patient Details Name: Colton Lamb MRN: [id number] DOB: 1950/10/26   Cancelled Treatment:    Reason Eval/Treat Not Completed: Fatigue/lethargy limiting ability to participate. Spoke to PT who just worked with pt. Per PT, pt very fatigued and unable to tolerate OT treatment with emphasis on chronic disease mgt training/education at this time. Will re-attempt OT tx at later date/time as pt is able to tolerate.   Jeni Salles, MPH, MS, OTR/L ascom [phone] 07/01/19, 11:40 AM

## 2019-07-01 NOTE — Progress Notes (Signed)
Bed scale is malfunctioning and not obtaining a true weight. Pt is unable to stand.

## 2019-07-01 NOTE — Progress Notes (Signed)
Physical Therapy Treatment Patient Details Name: Colton Lamb MRN: [id number] DOB: 25-Feb-1951 Today's Date: 07/01/2019    History of Present Illness Colton Lamb presented to ER secondary to respiratory distress; admitted for management of acute hypoxic/hypercarbic respiratory failure (requiring BiPAP at initial presentation).  Of note, patient with large, chronic stage IV sacral decubitus ulcer.    PT Comments    Pt lethargic and sister-in-law at bedside stating that pt has had new onset "confusion" this morning and nursing notified.  Pt oriented x3 but did report feeling very weak and repeatedly returned to closing his eyes and appearing to fall asleep throughout treatment.  He was able to initiate some movement such as ankle pumps and quad sets with manual cues, but required AAROM for most movement.  Pt reported pain in R knee with flexion, stating that he wasn't sure if it was joint pain or "my skin".  PT mindful of positioning and provided education regarding management of positioning related to prevention of pressure ulcers.  PT tech in to assist with scooting pt up in bed, total A.  Pt will continue to benefit from skilled PT with focus on strength, tolerance to activity and safe functional mobility.  Follow Up Recommendations  SNF     Equipment Recommendations  None recommended by PT    Recommendations for Other Services       Precautions / Restrictions Precautions Precautions: Fall Precaution Comments: sacral wound stage 4 Restrictions Weight Bearing Restrictions: No    Mobility  Bed Mobility Overal bed mobility: (Deferred due to pt request and state of consciousness.  PT tech in to assist in scooting pt up in bed and positioning.)                Transfers                    Ambulation/Gait                 Stairs             Wheelchair Mobility    Modified Rankin (Stroke Patients Only)       Balance                                             Cognition Arousal/Alertness: Lethargic Behavior During Therapy: WFL for tasks assessed/performed Overall Cognitive Status: Within Functional Limits for tasks assessed                                 General Comments: Pt's sister-in-law in room and stating that pt has had new onset confusion this morning and that nursing has been notified.  Pt oriented to date (recent holiday), location, name and situation.  He was able to tell me about recent hospitalizations and stay in SNF but appeared to be "in and out" with consciousness throughout treatment.  He reported feeling "very weak".      Exercises General Exercises - Lower Extremity Ankle Circles/Pumps: 10 reps;Supine;Strengthening;Both Quad Sets: Both;15 reps;Strengthening(manual cues) Short Arc Quad: AAROM;Both;10 reps;Supine Hip ABduction/ADduction: AAROM;Both;10 reps;Supine Straight Leg Raises: AAROM;Both;10 reps;Supine Other Exercises Other Exercises: Assistance with positioning and education regarding prevention of pressure ulcers.  Plastic piece on catheter noted to be pressing in to pt's leg and leaving a mark.  PT advised pt's sister in law  to make sure there is a barrier between the tubing and pt's skin. x6 min    General Comments        Pertinent Vitals/Pain Pain Assessment: Faces Faces Pain Scale: Hurts little more Pain Location: lower legs Pain Descriptors / Indicators: Grimacing;Guarding Pain Intervention(s): Limited activity within patient's tolerance;Monitored during session    Home Living                      Prior Function            PT Goals (current goals can now be found in the care plan section) Acute Rehab PT Goals PT Goal Formulation: With patient Time For Goal Achievement: 06/27/19 Potential to Achieve Goals: Fair Progress towards PT goals: PT to reassess next treatment    Frequency    Min 2X/week      PT Plan Current plan remains  appropriate    Co-evaluation              AM-PAC PT "6 Clicks" Mobility   Outcome Measure  Help needed turning from your back to your side while in a flat bed without using bedrails?: A Lot Help needed moving from lying on your back to sitting on the side of a flat bed without using bedrails?: A Lot Help needed moving to and from a bed to a chair (including a wheelchair)?: Total Help needed standing up from a chair using your arms (e.g., wheelchair or bedside chair)?: Total Help needed to walk in hospital room?: Total Help needed climbing 3-5 steps with a railing? : Total 6 Click Score: 8    End of Session Equipment Utilized During Treatment: Oxygen Activity Tolerance: Patient limited by fatigue;Treatment limited secondary to medical complications (Comment) Patient left: in bed;with call bell/phone within reach;with bed alarm set Nurse Communication: Mobility status PT Visit Diagnosis: Muscle weakness (generalized) (M62.81);Difficulty in walking, not elsewhere classified (R26.2)     Time: 7408-1448 PT Time Calculation (min) (ACUTE ONLY): 25 min  Charges:  $Therapeutic Exercise: 8-22 mins $Therapeutic Activity: 8-22 mins                     [name], PT, DPT   [name] 07/01/2019, 12:45 PM

## 2019-07-01 NOTE — Progress Notes (Signed)
   07/01/19 0700  Clinical Encounter Type  Visited With Patient  Visit Type Initial;Psychological support;Spiritual support;Pre-op  Referral From Nurse  Spiritual Encounters  Spiritual Needs Prayer;Emotional  Stress Factors  Patient Stress Factors Health changes;Loss;Other (Comment)  Pt expressed fear of his impending surgery. He said he fears he might not wake up. Chaplain normalized pt's fear and offered a calming presence.

## 2019-07-01 NOTE — Consult Note (Addendum)
[name] for Electrolyte Monitoring and Replacement   Colton Lamb is a 66 YOM with a hx of CHF, CAD, HTN, dyslipidemia, and Crohn's Disease who presented to the ED with acute onset of worsening dyspnea associated with dry cough and wheezing. He was placed on BiPAP and admitted to stepdown for further evaluation.  Pharmacy has been consulted for electrolyte monitoring and replacement.  Patient is now receiving: -Lasix PO 60 mg BID -Digoxin 0.125 mg QD -KCL PO 26mq QD  -Spironolactone 12.5 mg QD  He is on a carb modified diet and also getting NEPRO CARB STEADY 2375mBID between meals  Recent Labs: Potassium (mmol/L)  Date Value  07/01/2019 3.5  11/22/2013 3.7   Magnesium (mg/dL)  Date Value  06/30/2019 1.9  11/22/2013 1.8   Calcium (mg/dL)  Date Value  07/01/2019 8.9   Calcium, Total (mg/dL)  Date Value  11/22/2013 9.0   Albumin (g/dL)  Date Value  06/12/2019 2.9 (L)  04/18/2018 4.3   Phosphorus (mg/dL)  Date Value  06/22/2019 3.7   Sodium (mmol/L)  Date Value  07/01/2019 143  05/09/2016 141  11/22/2013 133 (L)   Corrected Calcium:   Goals of Therapy:  - K ~ 4 - Mg ~ 2 - Other electrolytes WNL  Assessment/Plan: Pt 4042masix IV q8h changed to 47m69m bid on 11/29- spironolactone, digoxin started KCL 20 meq PO daily while on lasix 11/28.  K 3.5; Scr 0.70 -Will order an additional KCL PO 20mE15m dose. Will follow electrolytes with AM labs.    Thank you for allowing pharmacy to be a part of this patient's care.   AsajaRowland LathermD Clinical Pharmacist 07/01/2019 7:20 AM

## 2019-07-01 NOTE — Progress Notes (Signed)
PROGRESS NOTE  Colton Lamb EQA:[id number] DOB: [birth year] DOA: 06/12/2019 PCP: [name], MD  Brief History   68 year old man PMH systolic CHF, Crohn's disease presented with shortness of breath.  Treated with BiPAP, Lasix and admitted for acute CHF exacerbation.  Treated with aggressive diuresis but this was limited by low blood pressure, also limiting evidence-based heart failure medications.  Managed closely by cardiology.  Has had tremendous diuresis although still has some volume overload.  Eventual plans for right and left heart catheterization but only after volume status is optimized and sacral decubitus is stable.  Other issues sacral decubitus which has chronic osteomyelitis.  Seen by ID with recommendation for diverting colostomy, antibiotics and wound VAC.  A & P  Acute on chronic systolic/diastolic CHF with associated cardiorenal syndrome.  LVEF 20-25%. --Continues to improve.  Appears near euvolemic at this point.  Continue oral Lasix, digoxin, spironolactone and Toprol-XL. --Consider resuming ACE inhibitor/ARB in near future. --Cardiology recommends right and left heart catheterization in the future to assess worsening cardiomyopathy, however this is deferred until volume status is optimized and sacral decubitus has healed.  Hypotension.  Thought secondary to CHF, hypoalbuminemia, deconditioning. Cortisol normal. --Remains stable.  Might be able to wean off midodrine.  Continue thigh-high compression hose. --Goal BP 90/50 as long as symptoms of hypotension do not develop.  Persistent atrial fibrillation --Rate controlled.  Continue digoxin, Toprol-XL, rivaroxaban.    Coronary artery disease --Remains stable.  Continue Xarelto in place of aspirin.  Continue Lipitor.  Diabetes mellitus type 2 --CBG remains stable.  Continue Levemir and sliding scale insulin.  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  Since August has been in a  nursing home or in the hospital, mostly bedbound.  Severely deconditioned. --Continue Zosyn.  Have consulted general surgery for consideration of diverting colostomy.  Patient remains open to this.  Normocytic anemia --Hemoglobin remained stable.  Chronic urinary catheter --Patient reports this was placed approximately August to help with wound care, healing of wound.  For now we will continue given difficulty with wound healing.  Obesity unspecified  Acute hypoxic, hypercarbic respiratory failure secondary to acute on chronic systolic CHF.  Diuresis limited by hypotension. --Hypoxia resolved.   Appears depressed.  DVT prophylaxis: rivaroxaban Code Status: Full Family Communication: none Disposition Plan: pending    Murray Hodgkins, MD  [facility] Direct contact: see [email or web address] (further directions at bottom of note if needed) 7PM-7AM contact night coverage as at bottom of note 07/01/2019, 4:37 PM  LOS: 19 days   Significant Hospital Events    11/11 evaluated for acute toxic respiratory failure secondary CHF.  11/20: card increased midodrine dose from 10 to 15 mg tid due to soft blood pressure; also added pseudoephedrine 60 mg every 6 hours  11/21: CT-pelvis showed: large decubitus ulcer overlying the lower sacrum and coccyx with underlying bone resorption consistent with osteomyelitis. Suspect combination of acute on chronic osteomyelitis. No abscess.   11/22 surgical consult.  No need for debridement.  11/23: transfuse 1 unit of blood  11/24: consulted ID.  Recommendation for diverting colostomy, wound VAC, antibiotics.   Consults:  . Cardiology . Infectious disease . General surgery   Procedures:    Significant Diagnostic Tests:  . 11/24 echocardiogram: LVEF 20-25%.  Global hypokinesis.   Micro Data:  .    Antimicrobials:  .   Interval History/Subjective  Feels okay today, was noted to be tearful earlier by chaplain.  Not hungry today.  Sister-in-law at bedside  notes that he seems withdrawn today.  Objective   Vitals:  Vitals:   07/01/19 0851 07/01/19 1205  BP: 137/84   Pulse: 93 94  Resp: 17 20  Temp: 98.2 F (36.8 C)   SpO2: 92% 93%    Exam:  Constitutional.  Appears calm, comfortable. Psychiatric.  Odd affect.  Mood appears depressed.  Oriented to self, location, year. Respiratory.  Clear to auscultation bilaterally.  No wheezes, rales or rhonchi.  Normal respiratory effort. Cardiovascular.  Regular rate and rhythm.  No murmur, rub or gallop.  No significant lower extremity edema. Abdomen.  Soft.  Flexi-Seal in place.  I have personally reviewed the following:   Today's Data  . Urine output 1325.  -19.9 L since admission. . CBG stable . BMP unremarkable . Hemoglobin stable at 9.4.  WBC was slight decrease at 11.2.  Scheduled Meds: . atorvastatin  80 mg Oral Daily  . Chlorhexidine Gluconate Cloth  6 each Topical Q0600  . digoxin  0.125 mg Oral Daily  . feeding supplement (NEPRO CARB STEADY)  237 mL Oral BID BM  . finasteride  5 mg Oral Daily  . furosemide  60 mg Oral BID  . insulin aspart  0-9 Units Subcutaneous TID WC  . insulin detemir  9 Units Subcutaneous Daily  . lactobacillus  1 g Oral TID WC  . metoprolol succinate  25 mg Oral Daily  . midodrine  5 mg Oral TID  . multivitamin-lutein   Oral Daily  . niacin  1,000 mg Oral Daily  . pantoprazole  40 mg Oral Daily  . potassium chloride  20 mEq Oral Daily  . sodium chloride flush  10-40 mL Intracatheter Q12H  . spironolactone  12.5 mg Oral Daily  . tamsulosin  0.4 mg Oral QPC breakfast  . vitamin C  500 mg Oral BID   Continuous Infusions: . sodium chloride Stopped (06/29/19 1035)  . piperacillin-tazobactam (ZOSYN)  IV 3.375 g (07/01/19 1436)    Principal Problem:   Acute on chronic combined systolic and diastolic CHF (congestive heart failure) (HCC) Active Problems:   CAD S/P percutaneous coronary angioplasty - multiple PCIs   Atrial  fibrillation ([place])   CROHN'S DISEASE-LARGE & SMALL INTESTINE   ICD (implantable cardioverter-defibrillator) in place   Diabetes mellitus type 2, uncontrolled, with complications ([place])   Acute respiratory failure (HCC)   Anemia   Stage IV pressure ulcer of sacral region ([place])   Hypotension   Normocytic anemia   PICC (peripherally inserted central catheter) flush   Chronic osteomyelitis ([place])   LOS: 19 days   How to contact the [facility] Attending or Consulting provider [place] or covering provider during after hours [place], for this patient?  1. Check the care team in [facility] and look for a) attending/consulting [facility] provider listed and b) the [facility] team listed 2. Log into [email or web address] and use [place]'s universal password to access. If you do not have the password, please contact the hospital operator. 3. Locate the [facility] provider you are looking for under [facility] and page to a number that you can be directly reached. 4. If you still have difficulty reaching the provider, please page the [facility] (Director on Call) for the Hospitalists listed on amion for assistance.

## 2019-07-01 NOTE — Progress Notes (Signed)
Pt Tearful this morning and wanting to talk. Talked with pt and offered to page the [place]. Chaplain paged.

## 2019-07-01 NOTE — Consult Note (Addendum)
[place] Nurse wound follow up Wound type: Vac dressing change to chronic stage 4 sacral  pressure injury. Measurement: see note from Fri Wound UQJ:FHLK pink nongranulating Drainage (amount, consistency, odor) moderate serosanguinous  No odor Periwound:Pt is frequently incontinent of liquid stool; Flexiseal is in place but patient is leaking around the tube and this makes it difficult to maintain a seal.  Applied barrier ring around wound edges and one piece black foam to 145m cont suction.  Pt denies complaint of pain and did not want medication prior to the procedure.  Track pad bridged to hip to reduce pressure.  [place] will perform dressing changes Q M/W/F.  DJulien GirtMSN, RN, [place]

## 2019-07-01 NOTE — Progress Notes (Signed)
Pt alert and oriented answering questions appropriately. Pt stating that he is feeling ok. Pt is not as talkative this shift. Rectal tube in place and draining soft brown stool. PICC infusing zosyn without difficulty. Foley catheter in place and draining urine.

## 2019-07-01 NOTE — Care Management Important Message (Signed)
Important Message  Patient Details  Name: Colton Lamb MRN: [id number] Date of Birth: [birth year]   Medicare Important Message Given:  Yes     Juliann Pulse A Stpehanie Montroy 07/01/2019, 11:44 AM

## 2019-07-01 NOTE — Consult Note (Signed)
Pharmacy Antibiotic Note  BRANDUN PINN is a 68 y.o. male admitted on 06/12/2019 with sacral ulcer osteomyelitis.  Pharmacy has been consulted for Zosyn dosing. Patient has previously been on Ceftazidime since 11/16 but has had minimal improvement on wound and worsening leukocytosis. Per hospitalist, will change to Zosyn to include anaerobic coverage.  Plan: Continue Zosyn 3.375g IV Q8 hours (Extended interval dosing) for now.   Height: 6' (182.9 cm) Weight: (unable to weigh; bed scale malfunction; pt unable to stand) IBW/kg (Calculated) : 77.6  Temp (24hrs), Avg:98.1 F (36.7 C), Min:97.5 F (36.4 C), Max:98.8 F (37.1 C)  Recent Labs  Lab 06/27/19 0456 06/28/19 0533 06/28/19 0534 06/29/19 0516 06/30/19 0523 07/01/19 0450  WBC 10.2  --  10.2 10.8* 12.3* 11.2*  CREATININE 0.75 0.73  --  0.79 0.83 0.70    Estimated Creatinine Clearance: 69.1 mL/min (by C-G formula based on SCr of 0.7 mg/dL).    Allergies  Allergen Reactions  . Iodine Other (See Comments)    Shortness of breath, swelling and hives  . Shrimp [Shellfish Allergy] Other (See Comments)    SWELLING    HIVES    SHORTNESS OF BREATH  . Tetracycline Rash    Antimicrobials this admission: Zosyn 11/25 >>  Tressie Ellis 11/16 >> 11/25   Microbiology results: 11/18 BCx: NGF 11/11 MRSA PCR: negative  Thank you for allowing pharmacy to be a part of this patient's care.  Rowland Lathe, PharmD Clinical Pharmacist 07/01/2019 2:50 PM

## 2019-07-01 NOTE — Progress Notes (Signed)
Pt has been quiet today/ denies pain.  Somewhat confused at times/ sister in law in earlier.  Rectal tube remains in place intatct so far.  Wound vac intact.

## 2019-07-01 NOTE — Progress Notes (Signed)
Progress Note  Patient Name: Colton Lamb Date of Encounter: 07/01/2019  Primary Cardiologist: Ida Rogue, MD   Subjective   Patient denies SOB but is unable to speak in full sentences and stops between words for deep breaths. Reports he is not moving around and wants to stay still. No CP, palpitations, or racing HR. Patient does not feel he is holding on to volume.   Appears somewhat confused today.  Inpatient Medications    Scheduled Meds: . atorvastatin  80 mg Oral Daily  . Chlorhexidine Gluconate Cloth  6 each Topical Q0600  . digoxin  0.125 mg Oral Daily  . feeding supplement (NEPRO CARB STEADY)  237 mL Oral BID BM  . finasteride  5 mg Oral Daily  . furosemide  60 mg Oral BID  . insulin aspart  0-9 Units Subcutaneous TID WC  . insulin detemir  9 Units Subcutaneous Daily  . lactobacillus  1 g Oral TID WC  . metoprolol succinate  25 mg Oral Daily  . midodrine  5 mg Oral TID  . multivitamin-lutein   Oral Daily  . niacin  1,000 mg Oral Daily  . pantoprazole  40 mg Oral Daily  . potassium chloride  20 mEq Oral Daily  . sodium chloride flush  10-40 mL Intracatheter Q12H  . spironolactone  12.5 mg Oral Daily  . tamsulosin  0.4 mg Oral QPC breakfast  . vitamin C  500 mg Oral BID   Continuous Infusions: . sodium chloride Stopped (06/29/19 1035)  . piperacillin-tazobactam (ZOSYN)  IV 3.375 g (07/01/19 1436)   PRN Meds: sodium chloride, acetaminophen, albuterol, ALPRAZolam, guaiFENesin-dextromethorphan, ondansetron (ZOFRAN) IV, sodium chloride flush, zolpidem   Vital Signs    Vitals:   06/30/19 2315 07/01/19 0546 07/01/19 0851 07/01/19 1205  BP:  122/80 137/84   Pulse: 91 95 93 94  Resp:  20 17 20   Temp:  98.8 F (37.1 C) 98.2 F (36.8 C)   TempSrc:      SpO2:  94% 92% 93%  Weight:      Height:        Intake/Output Summary (Last 24 hours) at 07/01/2019 1639 Last data filed at 07/01/2019 1400 Gross per 24 hour  Intake 47.58 ml  Output 2275 ml  Net  -2227.42 ml   Last 3 Weights 06/30/2019 06/30/2019 06/29/2019  Weight (lbs) (No Data) (No Data) 122 lb  Weight (kg) (No Data) (No Data) 55.339 kg      Telemetry    No longer on telemetry with recommendation for restart- Personally Reviewed  ECG    No new tracings- Personally Reviewed  Physical Exam   GEN: No acute distress. Somewhat confused.  Neck: Not able to assess JVD due to patient's accessory muscle use Cardiac: IRIR, tachycardic, no murmurs, rubs, or gallops.  Respiratory: Clear to auscultation bilaterally. GI: Soft, nontender, non-distended  MS: Bilateral TEDs, SCDs, and pumps with mild LEE; No deformity. Neuro:  Nonfocal  Psych: Normal affect   Labs    High Sensitivity Troponin:   Recent Labs  Lab 06/12/19 0100 06/24/19 1258  TROPONINIHS 34* 16      Cardiac EnzymesNo results for input(s): TROPONINI in the last 168 hours. No results for input(s): TROPIPOC in the last 168 hours.   Chemistry Recent Labs  Lab 06/29/19 0516 06/29/19 1810 06/30/19 0523 07/01/19 0450  NA 141  --  140 143  K 3.1* 4.0 3.6 3.5  CL 95*  --  98 99  CO2 31  --  28 31  GLUCOSE 144*  --  140* 125*  BUN 17  --  18 19  CREATININE 0.79  --  0.83 0.70  CALCIUM 8.0*  --  8.6* 8.9  GFRNONAA >60  --  >60 >60  GFRAA >60  --  >60 >60  ANIONGAP 15  --  14 13     Hematology Recent Labs  Lab 06/29/19 0516 06/30/19 0523 07/01/19 0450  WBC 10.8* 12.3* 11.2*  RBC 3.59* 3.79* 3.72*  HGB 9.0* 9.5* 9.4*  HCT 29.9* 30.8* 30.3*  MCV 83.3 81.3 81.5  MCH 25.1* 25.1* 25.3*  MCHC 30.1 30.8 31.0  RDW 18.2* 18.6* 18.9*  PLT 282 280 284    BNPNo results for input(s): BNP, PROBNP in the last 168 hours.   DDimer No results for input(s): DDIMER in the last 168 hours.   Radiology    No results found.  Cardiac Studies   Echo  06/25/2019  1. Left ventricular ejection fraction, by visual estimation, is 20 to 25%. The left ventricle has severely decreased function. There is mildly  increased left ventricular hypertrophy.  2. Severely dilated left ventricular internal cavity size.  3. There is global hypokinesis with thinning/akinesis of the anterior and septal walls.  4. Global right ventricle was not well visualized.The right ventricular size is not assessed. Right vetricular wall thickness was not assessed.  5. Left atrial size was not assessed.  6. Right atrial size was not assessed.  7. Small pericardial effusion.  8. The pericardial effusion is circumferential.  9. Mild mitral annular calcification. 10. The mitral valve was not assessed. No assessment of mitral valve regurgitation. 11. The tricuspid valve is not assessed. Tricuspid valve regurgitation was not assessed. 12. Aortic valve regurgitation was not assessed. 13. The aortic valve was not assessed. Aortic valve regurgitation was not assessed. 14. The pulmonic valve was not well visualized. Pulmonic valve regurgitation was not assessed. 15. Aortic root could not be assessed. 16. The interatrial septum was not assessed.  Echo 02/08/2019  1. The left ventricle has moderately reduced systolic function, with an ejection fraction of 35-40%. The cavity size was normal. Left ventricular diastolic Doppler parameters are indeterminate. Probably severe hypokinesis of mid-distal anterior,  ateroseptal and apical myocardium.  2. The right ventricle has normal systolic function. The cavity was normal. There is no increase in right ventricular wall thickness.  3. Left atrial size was moderately dilated.  4. Very poor image quality in spite of using Definity. Valves not well visualized. Consider different diagnostic testing if indicated.  5. The aortic valve was not well visualized. Aortic valve regurgitation was not assessed by color flow Doppler.   Patient Profile     68 y.o. male with history of CAD s/p PCI to LAD/LCx,OM, ICM, HFrEF (EF 35-40%  20-25%), ICD placement 2015, persistent Afib on Xarelto, h/o aflutter s/p  DCCV 2005 and ablation 2015 and followed for volume overload.  Assessment & Plan    Acute on chronic diastolic and systolic heart failure --Denies SOB but noted difficulty speaking without accessory muscle use and pausing to rest. Somewhat confused on exam. --Consider multifactorial etiology of SOB, including exacerbation of heart failure, non-healing decubitus ulcer / infection, anemia, and deconditioning.  --S/p ICD. EF reduced on updated echo 35-40%  20-25%. As previously noted, would benefit from further ischemic workup of reduced EF once treat decubitus ulcer / ulcer has healed and patient at baseline mental status.  --Monitor I/O, daily weights. Wt 119.4kg  120.6kg  118.5kg  and suspect weights past then have not been accurate. Net -20.7L for admission and -1.4L yesterday.  --Daily BMET. Current renal function stable. Cr 0.83  0.70 with BUN 18-19.    --Not currently on ACE/ARB/Entresto at this time due to hypotension now and earlier in admission. Remains on midodrine with BB restarted. Recommend transition to metoprolol tartrate to allow for titration for optimal HR control as BP allows. Continue with spironolactone 12.79m daily. Entresto deferred as above. Started on digoxin and can continue with diuresis as renal function and BP allows.  --Transitioned to oral diuresis, however given low EF, consider restart of IV diuresis until bump in renal function or decrease in urine output and as he is still putting out a considerable amount of urine per documented I/Os.   --Patient is currently a poor cardiac catheterization candidate given his confusion, nonhealing ulcer, anemia, and overall poor status. Recommend defer further workup until decubitus ulcer / infection resolved and patient returned to baseline mentation with no risk of acute bleeding. Consider palliative care consult.    Persistent atrial fibrillation s/p ICD --Denies symptoms but does appear short of breath on exam.  --Not on  telemetry but cardiac exam consistent with IRIR and tachycardic rhythm today.  --Restart telemetry. --CHA2DS2VASc score of at least 4 (CHF, DM2, agex1, vascular). PTA Xarelto held due to anemia at presentation in addition to ASA for CAD with recommendation for restart once safe to do so and no active bleeding or risk of acute blood loss. Hemoglobin improved from 7.6 as below and stable at 9.4.  --Consider transitioning from Toprol to metoprolol tartrate for easier titration of BB for rate control. Started on digoxin with recommendation of continued monitoring of renal function.  --Not currently a candidate for DCCV as not on anticoagulation and nonhealing ulcer and poor mentation.  Would need TEE before DCCV and be able to tolerate anticoagulation following the procedure.   Hypokalemia --Pharmacy consulted per IM for electrolyte monitoring. Will defer management to pharmacy.  Hypotension --Pressures remain soft. Continue to monitor. Continue midodrine. Defer addition of ARB or Entresto. Consider changing Toprol to metoprolol tartrate.  CAD / Elevated HS Tn --No chest pain. High-sensitivity troponin minimally elevated and relatively flat trending in the setting of anemia, hypertension, and CHF exacerbation. Multiple prior stents and pending further workup once recovered from ulcer and at baseline status. Continue high intensity statin for risk factor modification. Future considerations include R/LHC once recovered from ulcer and returned to baseline status with no risk of acute bleed. As above, currently holding ASA due to earlier drop in Hgb with need for transfusion.  Leukocytosis --WBC 10.5  11.2. Consider elevation in the setting of ulcer. CT shows large ulcer of lower sacrum and coccyx with underlying bone resorption consistent with osteomyelitis. Daily CBC.  Decubitus ulcer --Per IM / surgery.  Anemia, improving --S/p transfusion. Hgb 7.6  9.4. HCT 24.9  43.3. Xarelto / ASA was held  due to dropping hemoglobin with restart recommended once able and no risk of acute bleeding.   For questions or updates, please contact CPinetop Country ClubPlease consult [email or web address] for contact info under        Signed, JArvil Chaco PA-C  07/01/2019, 4:39 PM

## 2019-07-02 DIAGNOSIS — L89154 Pressure ulcer of sacral region, stage 4: Secondary | ICD-10-CM | POA: Diagnosis not present

## 2019-07-02 DIAGNOSIS — I2510 Atherosclerotic heart disease of native coronary artery without angina pectoris: Secondary | ICD-10-CM | POA: Diagnosis not present

## 2019-07-02 DIAGNOSIS — I4819 Other persistent atrial fibrillation: Secondary | ICD-10-CM | POA: Diagnosis not present

## 2019-07-02 DIAGNOSIS — I5043 Acute on chronic combined systolic (congestive) and diastolic (congestive) heart failure: Secondary | ICD-10-CM | POA: Diagnosis not present

## 2019-07-02 DIAGNOSIS — I959 Hypotension, unspecified: Secondary | ICD-10-CM | POA: Diagnosis not present

## 2019-07-02 LAB — BASIC METABOLIC PANEL
Anion gap: 12 (ref 5–15)
BUN: 18 mg/dL (ref 8–23)
CO2: 30 mmol/L (ref 22–32)
Calcium: 8.9 mg/dL (ref 8.9–10.3)
Chloride: 101 mmol/L (ref 98–111)
Creatinine, Ser: 0.66 mg/dL (ref 0.61–1.24)
GFR calc Af Amer: >60 mL/min (ref >60)
GFR calc non Af Amer: >60 mL/min (ref >60)
Glucose, Bld: 113 mg/dL — ABNORMAL HIGH (ref 70–99)
Potassium: 3.5 mmol/L (ref 3.5–5.1)
Sodium: 143 mmol/L (ref 135–145)

## 2019-07-02 LAB — CBC WITH DIFFERENTIAL/PLATELET
Abs Immature Granulocytes: 0.04 10*3/uL (ref 0.00–0.07)
Basophils Absolute: 0.1 10*3/uL (ref 0.0–0.1)
Basophils Relative: 1 %
Eosinophils Absolute: 0.4 10*3/uL (ref 0.0–0.5)
Eosinophils Relative: 4 %
HCT: 31.8 % — ABNORMAL LOW (ref 39.0–52.0)
Hemoglobin: 9.4 g/dL — ABNORMAL LOW (ref 13.0–17.0)
Immature Granulocytes: 0 %
Lymphocytes Relative: 13 %
Lymphs Abs: 1.2 10*3/uL (ref 0.7–4.0)
MCH: 24.8 pg — ABNORMAL LOW (ref 26.0–34.0)
MCHC: 29.6 g/dL — ABNORMAL LOW (ref 30.0–36.0)
MCV: 83.9 fL (ref 80.0–100.0)
Monocytes Absolute: 0.5 10*3/uL (ref 0.1–1.0)
Monocytes Relative: 5 %
Neutro Abs: 7.2 10*3/uL (ref 1.7–7.7)
Neutrophils Relative %: 77 %
Platelets: 280 10*3/uL (ref 150–400)
RBC: 3.79 MIL/uL — ABNORMAL LOW (ref 4.22–5.81)
RDW: 19 % — ABNORMAL HIGH (ref 11.5–15.5)
WBC: 9.5 10*3/uL (ref 4.0–10.5)
nRBC: 0 % (ref 0.0–0.2)

## 2019-07-02 LAB — GLUCOSE, CAPILLARY
Glucose-Capillary: 115 mg/dL — ABNORMAL HIGH (ref 70–99)
Glucose-Capillary: 119 mg/dL — ABNORMAL HIGH (ref 70–99)
Glucose-Capillary: 143 mg/dL — ABNORMAL HIGH (ref 70–99)
Glucose-Capillary: 184 mg/dL — ABNORMAL HIGH (ref 70–99)

## 2019-07-02 LAB — MAGNESIUM: Magnesium: 2.1 mg/dL (ref 1.7–2.4)

## 2019-07-02 MED ORDER — NEPRO/CARBSTEADY PO LIQD
237.0000 mL | Freq: Three times a day (TID) | ORAL | Status: DC
  Administered 2019-07-02 – 2019-07-09 (×11): 237 mL via ORAL

## 2019-07-02 MED ORDER — POTASSIUM CHLORIDE CRYS ER 20 MEQ PO TBCR
30.0000 meq | EXTENDED_RELEASE_TABLET | Freq: Once | ORAL | Status: AC
  Administered 2019-07-02: 30 meq via ORAL
  Filled 2019-07-02: qty 1

## 2019-07-02 MED ORDER — ASPIRIN EC 81 MG PO TBEC
81.0000 mg | DELAYED_RELEASE_TABLET | Freq: Every day | ORAL | Status: DC
  Administered 2019-07-02 – 2019-07-09 (×6): 81 mg via ORAL
  Filled 2019-07-02 (×6): qty 1

## 2019-07-02 NOTE — Progress Notes (Signed)
Patient confused at times this shift, also some hallucinating and talking to himself as if someone else was in room. VSS,

## 2019-07-02 NOTE — Progress Notes (Signed)
Occupational Therapy Treatment Patient Details Name: Colton Lamb MRN: [id number] DOB: [birth year] Today's Date: 07/02/2019    History of present illness Colton Lamb presented to ER secondary to respiratory distress; admitted for management of acute hypoxic/hypercarbic respiratory failure (requiring BiPAP at initial presentation).  Of note, patient with large, chronic stage IV sacral decubitus ulcer.   OT comments  [name] was seen for OT treatment on this date. Upon arrival to room pt awake/alert just finishing up PT session. Pt agreeable to OT tx. OT engages pt in discussion about chronic disease management with specific considerations for managing his CHF upon hospital DC. OT provides reinforcement of prior education in CHF management strategies including weighing himself first thing each morning and keeping a record of his daily weights. Pt verbalizes plan to weigh himself after waking up each morning. Of note: Pt received with wound vac tubing disconnected and on floor of room. RN immediately notified. Pt also mildly confused throughout session, particularly to situation. He appeared to fluctuate in his awareness of current deficits. Pt endorsed walking "about a day ago", however, Pt not currently able to stand or complete functional mobility without +2 max assist or use of lift equipment. Pt making good progress toward goals and continues to benefit from skilled OT services to maximize return to PLOF and minimize risk of future falls, injury, caregiver burden, and readmission. Will continue to follow POC. Discharge recommendation remains appropriate.    Follow Up Recommendations  SNF    Equipment Recommendations  Tub/shower seat    Recommendations for Other Services      Precautions / Restrictions Precautions Precautions: Fall Precaution Comments: sacral wound stage 4 Restrictions Weight Bearing Restrictions: No       Mobility Bed Mobility Overal bed mobility: (Deferred  at pt request. Had just worked with PT, also pt noted to have disconnected wound vac RN notified but not in room to assess/clear for additional activity by end of OT session.)                Transfers                      Balance Overall balance assessment: Needs assistance Sitting-balance support: Feet supported;Bilateral upper extremity supported Sitting balance-Leahy Scale: Poor     Standing balance support: Bilateral upper extremity supported Standing balance-Leahy Scale: Zero                             ADL either performed or assessed with clinical judgement   ADL Overall ADL's : Needs assistance/impaired                                       General ADL Comments: Pt continues to require max A for LB ADL from EOB, +2 for OOB attempts     Vision Baseline Vision/History: Wears glasses Wears Glasses: Reading only Patient Visual Report: No change from baseline Vision Assessment?: No apparent visual deficits   Perception     Praxis      Cognition Arousal/Alertness: Awake/alert Behavior During Therapy: WFL for tasks assessed/performed Overall Cognitive Status: Within Functional Limits for tasks assessed                                 General Comments: Pt A&O x  4 this date, however at times does appear intermittently confused about situation. States "About a day ago I was walking up the stairs". Per chart pt has not walked since admission. Aware he is at [facility] and can state date.        Exercises Other Exercises Other Exercises: Pt provided with reinforcement in prior education on CHF chronic disease mgt strategies including improving tracking and daily weighing, s/s that he may be approaching fluid overload, and risks of ignoring s/s; pt verbalized understanding and states plan to weigh himself daily. OT reinforces strategies for keeping a record of his daily weights.   Shoulder Instructions       General  Comments Pt found with wound vac disconnected, tubing on floor. RN notified immediately, but not in room to assess during OT session. Pt left in bed with rectal tube in place.    Pertinent Vitals/ Pain       Pain Assessment: Faces Faces Pain Scale: Hurts a little bit Pain Location: lower legs Pain Descriptors / Indicators: Grimacing;Guarding Pain Intervention(s): Limited activity within patient's tolerance;Monitored during session  Home Living                                          Prior Functioning/Environment              Frequency  Min 1X/week        Progress Toward Goals  OT Goals(current goals can now be found in the care plan section)  Progress towards OT goals: Progressing toward goals  Acute Rehab OT Goals Patient Stated Goal: to get stronger and get home OT Goal Formulation: With patient Time For Goal Achievement: 07/09/19 Potential to Achieve Goals: Good  Plan Discharge plan remains appropriate;Frequency remains appropriate    Co-evaluation                 AM-PAC OT "6 Clicks" Daily Activity     Outcome Measure   Help from another person eating meals?: None Help from another person taking care of personal grooming?: A Little Help from another person toileting, which includes using toliet, bedpan, or urinal?: Total Help from another person bathing (including washing, rinsing, drying)?: A Lot Help from another person to put on and taking off regular upper body clothing?: A Little Help from another person to put on and taking off regular lower body clothing?: A Lot 6 Click Score: 15    End of Session    OT Visit Diagnosis: Other abnormalities of gait and mobility (R26.89);Muscle weakness (generalized) (M62.81);History of falling (Z91.81)   Activity Tolerance Patient tolerated treatment well   Patient Left in bed;with call bell/phone within reach;with bed alarm set;with SCD's reapplied;Other (comment)(With rectal tube/prevlon  boots in place.)   Nurse Communication          Time: 1450-1506 OT Time Calculation (min): 16 min  Charges: OT General Charges $OT Visit: 1 Visit OT Treatments $Self Care/Home Management : 8-22 mins  Shara Blazing, M.S., OTR/L Ascom: [phone] 07/02/19, 3:19 PM

## 2019-07-02 NOTE — Progress Notes (Signed)
PROGRESS NOTE  Colton Lamb ZGY:[id number] DOB: 08/05/1950 DOA: 06/12/2019 PCP: [name], MD  Brief History   68 year old man PMH systolic CHF, Crohn's disease presented with shortness of breath.  Treated with BiPAP, Lasix and admitted for acute CHF exacerbation.  Treated with aggressive diuresis but this was limited by low blood pressure, also limiting evidence-based heart failure medications.  Managed closely by cardiology.  Has had tremendous diuresis although still has some volume overload.  Eventual plans for right and left heart catheterization but only after volume status is optimized and sacral decubitus is stable.  Other issues sacral decubitus which has chronic osteomyelitis.  Seen by ID with recommendation for diverting colostomy, antibiotics and wound VAC.  A & P  Acute on chronic systolic/diastolic CHF with associated cardiorenal syndrome.  LVEF 20-25%. --Appears to be euvolemic at this time.  Continue oral Lasix, continue digoxin and spironolactone Toprol-XL.   --Consider resuming ACE inhibitor/ARB in near future. --Cardiology recommends right and left heart catheterization in the future to assess worsening cardiomyopathy, however this is deferred until volume status is optimized and sacral decubitus has healed.  I discussed this again with Dr. Saunders Revel today.  Hypotension. Thought secondary to CHF, hypoalbuminemia, deconditioning. Cortisol normal. --Remained stable.  Discontinue midodrine.  Continue thigh-high compression hose.   --Goal BP 90/50 as long as symptoms of hypotension do not develop.  Persistent atrial fibrillation --Remains rate controlled.  Continue digoxin, Toprol-XL.  Xarelto on hold perioperatively.  Started on aspirin in the meantime.  Coronary artery disease --Remains stable.  Continue Lipitor.  Diabetes mellitus type 2 --CBG remains stable.  Continue Levemir and sliding scale insulin.  Stage IV sacral decubiti present on admission, with chronic  osteomyelitis.  Since August has been in a nursing home or in the hospital, mostly bedbound.  Severely deconditioned. --Continue Zosyn.    Normocytic anemia --Hemoglobin remained stable.  Chronic urinary catheter --Patient reports this was placed approximately August to help with wound care, healing of wound.    Obesity unspecified  Acute hypoxic, hypercarbic respiratory failure secondary to acute on chronic systolic CHF.  Diuresis limited by hypotension. --Hypoxia resolved.   Prognosis remains guarded.  Appreciate surgery consultation for consideration of diverting colostomy.  Palliative medicine consulted to assist with goals of care.   DVT prophylaxis: rivaroxaban on hold perioperatively. Code Status: Full Family Communication: none Disposition Plan: pending    Murray Hodgkins, MD  [facility] Direct contact: see [email or web address] (further directions at bottom of note if needed) 7PM-7AM contact night coverage as at bottom of note 07/02/2019, 2:14 PM  LOS: 20 days   Significant Hospital Events    11/11 evaluated for acute toxic respiratory failure secondary CHF.  11/20: card increased midodrine dose from 10 to 15 mg tid due to soft blood pressure; also added pseudoephedrine 60 mg every 6 hours  11/21: CT-pelvis showed: large decubitus ulcer overlying the lower sacrum and coccyx with underlying bone resorption consistent with osteomyelitis. Suspect combination of acute on chronic osteomyelitis. No abscess.   11/22 surgical consult.  No need for debridement.  11/23: transfuse 1 unit of blood  11/24: consulted ID.  Recommendation for diverting colostomy, wound VAC, antibiotics.  12/1 General surgery consultation   Consults:  . Cardiology . Infectious disease . General surgery   Procedures:    Significant Diagnostic Tests:  . 11/24 echocardiogram: LVEF 20-25%.  Global hypokinesis.   Micro Data:  .    Antimicrobials:  .   Interval History/Subjective  Feels  fine today, no  complaints.  Breathing fine.  Objective   Vitals:  Vitals:   07/02/19 0454 07/02/19 0805  BP: 127/71 108/67  Pulse: 83 84  Resp: 18 18  Temp: (!) 97.5 F (36.4 C) 97.9 F (36.6 C)  SpO2: 94% 95%    Exam:  Constitutional.  Appears calm, comfortable. Cardiovascular.  Regular rate and rhythm.  No murmur, rub or gallop. Respiratory.  Clear to auscultation bilaterally.  No wheezes, rales or rhonchi.  Normal respiratory effort. Abdomen.  Soft, nontender, nondistended. Psychiatric.  Grossly normal mood and affect.  Speech fluent and appropriate.  I have personally reviewed the following:   Today's Data  . Urine output 750.  -18 L since admission. Marland Kitchen BMP and magnesium unremarkable. . Hemoglobin stable at 9.4.  Scheduled Meds: . aspirin EC  81 mg Oral Daily  . atorvastatin  80 mg Oral Daily  . Chlorhexidine Gluconate Cloth  6 each Topical Q0600  . digoxin  0.125 mg Oral Daily  . feeding supplement (NEPRO CARB STEADY)  237 mL Oral TID BM  . finasteride  5 mg Oral Daily  . furosemide  60 mg Oral BID  . insulin aspart  0-9 Units Subcutaneous TID WC  . insulin detemir  9 Units Subcutaneous Daily  . lactobacillus  1 g Oral TID WC  . metoprolol succinate  25 mg Oral Daily  . multivitamin-lutein   Oral Daily  . niacin  1,000 mg Oral Daily  . pantoprazole  40 mg Oral Daily  . potassium chloride  30 mEq Oral Once  . potassium chloride  20 mEq Oral Daily  . sodium chloride flush  10-40 mL Intracatheter Q12H  . spironolactone  12.5 mg Oral Daily  . tamsulosin  0.4 mg Oral QPC breakfast  . vitamin C  500 mg Oral BID   Continuous Infusions: . sodium chloride Stopped (06/29/19 1035)  . piperacillin-tazobactam (ZOSYN)  IV 3.375 g (07/02/19 0503)    Principal Problem:   Acute on chronic combined systolic and diastolic CHF (congestive heart failure) (HCC) Active Problems:   CAD S/P percutaneous coronary angioplasty - multiple PCIs   Atrial fibrillation ([place])    CROHN'S DISEASE-LARGE & SMALL INTESTINE   ICD (implantable cardioverter-defibrillator) in place   Diabetes mellitus type 2, uncontrolled, with complications ([place])   Acute respiratory failure (HCC)   Anemia   Stage IV pressure ulcer of sacral region ([place])   Hypotension   Normocytic anemia   PICC (peripherally inserted central catheter) flush   Chronic osteomyelitis ([place])   LOS: 20 days   How to contact the [facility] Attending or Consulting provider 7A - 7P or covering provider during after hours [place], for this patient?  1. Check the care team in [facility] and look for a) attending/consulting [facility] provider listed and b) the [facility] team listed 2. Log into [email or web address] and use [place]'s universal password to access. If you do not have the password, please contact the hospital operator. 3. Locate the [facility] provider you are looking for under [facility] and page to a number that you can be directly reached. 4. If you still have difficulty reaching the provider, please page the [facility] (Director on Call) for the Hospitalists listed on amion for assistance.

## 2019-07-02 NOTE — Progress Notes (Signed)
Nutrition Follow-up  RD working remotely.  DOCUMENTATION CODES:   Obesity unspecified  INTERVENTION:  Increase to Nepro Shake po TID, each supplement provides 425 kcal and 19 grams protein.  Continue Ocuvite daily for wound healing (provides zinc, vitamin A, vitamin C, Vitamin E, copper, and selenium).  Continue vitamin C 500 mg BID.  Recommend measuring new weight as weight from 11/28 appears to have been entered incorrectly and is over 100 lbs off from patient's weight.   NUTRITION DIAGNOSIS:   Increased nutrient needs related to wound healing as evidenced by estimated needs.  Ongoing.  GOAL:   Patient will meet greater than or equal to 90% of their needs  Progressing.  MONITOR:   PO intake, Supplement acceptance, Labs, Weight trends, Skin, I & O's  REASON FOR ASSESSMENT:   LOS    ASSESSMENT:   68 y.o. male with a known history of systolic CHF, coronary artery disease hypertension dyslipidemia and Crohn's disease admitted with CHF and sacral wound  Spoke with patient over the phone. He reports his appetite has been decreasing the last 2-3 days as he has not been sleeping well. He was previously eating 100% of his meals. Yesterday he ate 0% of breakfast and lunch according to chart but dinner was not filled out and he is not sure how he did. He reports he had breakfast today but is unable to report how much he had. He continues to drink his Nepro.  Noted surgery has been consulted for possible diverting colostomy.  Weight of 122 lbs on 11/28 is incorrect and they likely meant to enter 122 kg. Recommend obtaining a new weight prior to surgery w/u so medications can be dosed appropriately.  Medications reviewed and include: Lasix 60 mg BID, Novolog 0-9 units TID, Levemir 9 units daily, lactobacillus 1 gram TID with meals, Ocuvite daily, pantoprazole, potassium chloride 30 mEq once today, potassium chloride 20 mEq daily, spironolactone, Flomax, vitamin C 500 mg BID,  Zosyn.  Labs reviewed: CBG 115-153.  Diet Order:   Diet Order            Diet Carb Modified Fluid consistency: Thin; Room service appropriate? Yes  Diet effective now             EDUCATION NEEDS:   Education needs have been addressed  Skin:  Skin Assessment: Skin Integrity Issues:(stg IV sacrum (9cm x 6cm x3cm) with wound VAC)  Last BM:  07/02/2019 - 200 mL per rectal tube  Height:   Ht Readings from Last 1 Encounters:  06/13/19 6' (1.829 m)   Weight:   Wt Readings from Last 1 Encounters:  06/10/19 110.7 kg   Ideal Body Weight:  80.9 kg  BMI:  Body mass index is 16.55 kg/m.  Estimated Nutritional Needs:   Kcal:  2300-2600kcal/day  Protein:  115-130g/day  Fluid:  >2L/day  Jacklynn Barnacle, MS, RD, LDN Office: [phone] Pager: [phone] After Hours/Weekend Pager: [phone]

## 2019-07-02 NOTE — Consult Note (Addendum)
[facility] SURGICAL CONSULTATION NOTE (initial) - cpt: 61607   HISTORY OF PRESENT ILLNESS (HPI):  68 y.o. male who was admitted to [facility] on 06/12/2019 for acute on chronic CHF with hypercarbic respiratory failure. He has a significant cardiac history. In the hospital he has been diuresed close to euvolemia, most recent EF is 20-25%, atrial fibrillation is controlled and anticoagulation is held. From a cardiac standpoint, he was deemed a moderate risk for surgical intervention. He is known to our practice and Dr Dahlia Byes saw him in consult on 11/22 for his sacral decubitus ulceration. This has apparently been present since August of this year. At that time, he did not require any further debridements and local wound care was recommended. He has been managed with wound vac and stool management has been attempted with fexiseal. ID also following for likely chronic osteomyelitis management and recommended discussion of diverting colostomy as wound appeared saturated with stool on multiple previous examinations. He denied any fever or chills. He is significantly deconditioned and only does "leg exercises" with PT in bed. He is not paraplegic an does not have a permanent neurological deficits. He does have a history of Crohn's Disease and underwent small bowel resection ~20 years ago with Dr Fuller Plan. No other complaints at this time. He does endorse interest in surgical options including diverting colostomy.   Surgery is consulted by hospitalist physician Dr. Murray Hodgkins,. MD in this context for evaluation for possible diverting colostomy.   PAST MEDICAL HISTORY (PMH):  Past Medical History:  Diagnosis Date  . Atrial flutter ([place])    a. s/p Cardioversion 11/22/13, on amiodarone and Xarelto.  . CHF (congestive heart failure) ([place])   . Chronic osteomyelitis ([place]) 06/30/2019  . Chronic systolic heart failure ([place])    a. 10/2013 EF 20-25%, grade III DD, RV mildly dilated and sys fx mild/mod  reduced;  b. 01/2014 Echo: EF 30-35%, gr3 DD, mod dil LA.  Marland Kitchen Coronary artery disease    a. s/p MI 2007/2015;  b. s/p prior PCI to the LAD/LCX/PDA/PL;  c. 2008: s/p Cypher DES to the OM.  Marland Kitchen Crohn's ileocolitis ([place])   . GERD (gastroesophageal reflux disease)   . Hx of adenomatous colonic polyps 11/2003  . Hyperlipidemia   . Hypertension   . Ischemic cardiomyopathy    s. 01/2014 s/p MDT [id number] [name] XT DR single lead AICD.  Marland Kitchen Obesity   . Paroxysmal atrial fibrillation (HCC)    a. CHA2DS2VASc = 4-->xarelto/amio.  . Sleep apnea   . Syncope    a.  11/2013 in setting of volume depletion and bradycardia due to dig toxicity   . Type II diabetes mellitus ([place])      PAST SURGICAL HISTORY ([place]):  Past Surgical History:  Procedure Laterality Date  . ATRIAL FLUTTER ABLATION N/A 04/16/2014   Procedure: ATRIAL FLUTTER ABLATION;  Surgeon: Evans Lance, MD;  Location: [facility] CATH LAB;  Service: Cardiovascular;  Laterality: N/A;  . CARDIAC CATHETERIZATION  10/2013  . CARDIAC DEFIBRILLATOR PLACEMENT  04/16/2014   Medtronic Evira device  . CARDIAC ELECTROPHYSIOLOGY STUDY AND ABLATION  04/16/2014   atrial flutter ablation  . CARDIOVERSION N/A 03/05/2014   Procedure: CARDIOVERSION;  Surgeon: Jolaine Artist, MD;  Location: [facility] ENDOSCOPY;  Service: Cardiovascular;  Laterality: N/A;  . CATARACT EXTRACTION W/PHACO Right 01/04/2017   Procedure: CATARACT EXTRACTION PHACO AND INTRAOCULAR LENS PLACEMENT ([place])  Right Diabetic Complicated;  Surgeon: Leandrew Koyanagi, MD;  Location: [place];  Service: Ophthalmology;  Laterality: Right;  Diabetic  . CATARACT EXTRACTION W/PHACO Left 02/08/2017   Procedure: CATARACT EXTRACTION PHACO AND INTRAOCULAR LENS PLACEMENT ([place]) left diabetic;  Surgeon: Leandrew Koyanagi, MD;  Location: [place];  Service: Ophthalmology;  Laterality: Left;  Diabetic - oral meds sleep apnea  . CORONARY ANGIOPLASTY WITH STENT PLACEMENT  2007; 2008 X 2   "1+1 ~ 1"  . FOOT  SURGERY Left    bone spur  . HYDROCELE EXCISION Bilateral   . 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 IMPLANTABLE CARDIOVERTER DEFIBRILLATOR IMPLANT N/A 04/16/2014   Procedure: IMPLANTABLE CARDIOVERTER DEFIBRILLATOR IMPLANT;  Surgeon: Evans Lance, MD;  Location: [facility] CATH LAB;  Service: Cardiovascular;  Laterality: N/A;  . LEFT HEART CATHETERIZATION WITH CORONARY ANGIOGRAM N/A 11/22/2013   Procedure: LEFT HEART CATHETERIZATION WITH CORONARY ANGIOGRAM;  Surgeon: Sinclair Grooms, MD;  Location: [facility] CATH LAB;  Service: Cardiovascular;  Laterality: N/A;     MEDICATIONS:  Prior to Admission medications   Medication Sig Start Date End Date Taking? Authorizing Provider  Albuterol Sulfate 108 (90 Base) MCG/ACT AEPB Inhale 1 puff into the lungs every 6 (six) hours as needed (shortness of breath).    Yes [provider]  aspirin 81 MG tablet Take 81 mg by mouth daily.    Yes [provider]  atorvastatin (LIPITOR) 80 MG tablet TAKE 1 TABLET BY MOUTH EVERY DAY Patient taking differently: Take 80 mg by mouth daily.  03/08/19  Yes Gollan, Kathlene November, MD  balsalazide (COLAZAL) 750 MG capsule TAKE 1 CAPSULE (750 MG TOTAL) BY MOUTH 3 (THREE) TIMES DAILY. Patient taking differently: Take 750 mg by mouth 3 (three) times daily.  11/28/18  Yes Ladene Artist, MD  finasteride (PROSCAR) 5 MG tablet Take 1 tablet (5 mg total) by mouth daily. 03/20/19  Yes Wieting, Richard, MD  insulin aspart (NOVOLOG) 100 UNIT/ML injection Inject 5 Units into the skin 3 (three) times daily with meals. Patient taking differently: Inject 5 Units into the skin 3 (three) times daily before meals.  03/19/19  Yes Wieting, Richard, MD  insulin detemir (LEVEMIR) 100 UNIT/ML injection Inject 0.13 mLs (13 Units total) into the skin daily. Patient taking differently: Inject 9 Units into the skin daily.  03/19/19  Yes Wieting, Richard, MD  lactobacillus (FLORANEX/LACTINEX) PACK Take 1 packet (1 g  total) by mouth 3 (three) times daily with meals. 03/19/19  Yes Wieting, Richard, MD  midodrine (PROAMATINE) 10 MG tablet Take 1 tablet (10 mg total) by mouth 3 (three) times daily. 04/26/19  Yes Max Sane, MD  Multiple Vitamins-Minerals (DECUBI-VITE PO) Take by mouth daily.   Yes [provider]  niacin (NIASPAN) 1000 MG CR tablet Take 1,000 mg by mouth daily. 04/10/19  Yes [provider]  Nutritional Supplements (FEEDING SUPPLEMENT, NEPRO CARB STEADY,) LIQD Take 237 mLs by mouth 2 (two) times daily between meals. 03/19/19  Yes Wieting, Richard, MD  omeprazole (PRILOSEC) 20 MG capsule Take 20 mg by mouth daily.    Yes [provider]  potassium chloride (KLOR-CON) 10 MEQ tablet Take 1 tablet (10 mEq total) by mouth daily. 05/08/19 08/06/19 Yes Gollan, Kathlene November, MD  sodium hypochlorite (DAKIN'S 1/2 STRENGTH) external solution Irrigate with 1 application as directed 2 (two) times daily. (apply to sacral wound)   Yes [provider]  tamsulosin (FLOMAX) 0.4 MG CAPS capsule Take 1 capsule (0.4 mg total) by mouth daily after breakfast. 03/20/19  Yes Leslye Peer, Richard, MD  torsemide (DEMADEX) 20 MG tablet  Take 2 tablets (40 mg) by mouth once daily 05/08/19  Yes Gollan, Kathlene November, MD  XARELTO 20 MG TABS tablet TAKE 1 TABLET BY MOUTH EVERY DAY WITH LUNCH 05/17/19  Yes Gollan, Kathlene November, MD  acetaminophen (TYLENOL) 325 MG tablet Take 1-2 tablets (325-650 mg total) by mouth every 4 (four) hours as needed for mild pain. 04/17/14   [name], NP  dextromethorphan-guaiFENesin [facility] DM) 30-600 MG 12hr tablet Take 1 tablet by mouth 2 (two) times daily.    [provider]  dextromethorphan-guaiFENesin (ROBITUSSIN-DM) 10-100 MG/5ML liquid Take 10 mLs by mouth every 4 (four) hours as needed for cough.    [provider]     ALLERGIES:  Allergies  Allergen Reactions  . Iodine Other (See Comments)    Shortness of breath, swelling and hives  . Shrimp [Shellfish  Allergy] Other (See Comments)    SWELLING    HIVES    SHORTNESS OF BREATH  . Tetracycline Rash     SOCIAL HISTORY:  Social History   Socioeconomic History  . Marital status: Widowed    Spouse name: Not on file  . Number of children: 1  . Years of education: Not on file  . Highest education level: Not on file  Occupational History  . Occupation: retired    Fish farm manager: RETIRED  Social Needs  . Financial resource strain: Not hard at all  . Food insecurity    Worry: Never true    Inability: Never true  . Transportation needs    Medical: No    Non-medical: No  Tobacco Use  . Smoking status: Never Smoker  . Smokeless tobacco: Never Used  Substance and Sexual Activity  . Alcohol use: No  . Drug use: No  . Sexual activity: Never  Lifestyle  . Physical activity    Days per week: 0 days    Minutes per session: 0 min  . Stress: Not on file  Relationships  . Social Herbalist on phone: Never    Gets together: Never    Attends religious service: Never    Active member of club or organization: Yes    Attends meetings of clubs or organizations: 1 to 4 times per year    Relationship status: Never married  . Intimate partner violence    Fear of current or ex partner: No    Emotionally abused: Not on file    Physically abused: No    Forced sexual activity: No  Other Topics Concern  . Not on file  Social History Narrative  . Not on file     FAMILY HISTORY:  Family History  Problem Relation Age of Onset  . Breast cancer Mother   . Heart disease Father   . Heart attack Father   . Colon cancer Neg Hx       REVIEW OF SYSTEMS:  Review of Systems  Constitutional: Negative for chills and fever.  Gastrointestinal: Negative for diarrhea, nausea and vomiting.  All other systems reviewed and are negative.   VITAL SIGNS:  Temp:  [97.5 F (36.4 C)-98.6 F (37 C)] 97.5 F (36.4 C) (12/01 0454) Pulse Rate:  [80-94] 83 (12/01 0454) Resp:  [17-20] 18 (12/01 0454)  BP: (109-137)/(68-84) 127/71 (12/01 0454) SpO2:  [92 %-95 %] 94 % (12/01 0454)     Height: 6' (182.9 cm) Weight: (unable to weigh; bed scale malfunction; pt unable to stand) BMI (Calculated): 16.54   INTAKE/OUTPUT:  11/30 0701 - 12/01 0700 In:  581.9 [IV Piggyback:81.9] Out: 750 [Urine:750]  PHYSICAL EXAM:  Physical Exam Vitals signs and nursing note reviewed. Exam conducted with a chaperone present.  Constitutional:      General: He is not in acute distress.    Appearance: Normal appearance. He is obese. He is not ill-appearing.  HENT:     Head: Normocephalic and atraumatic.  Eyes:     General: No scleral icterus.    Conjunctiva/sclera: Conjunctivae normal.  Cardiovascular:     Rate and Rhythm: Normal rate. Rhythm irregularly irregular.     Pulses: Normal pulses.     Heart sounds: Normal heart sounds.  Pulmonary:     Effort: Pulmonary effort is normal. No respiratory distress.     Breath sounds: Normal breath sounds. No wheezing.  Abdominal:     General: A surgical scar is present. There is no distension.     Palpations: Abdomen is soft.     Tenderness: There is no abdominal tenderness. There is no guarding or rebound.    Genitourinary:    Comments: Rectal tube in place Musculoskeletal:     Right lower leg: No edema.     Left lower leg: No edema.  Skin:    General: Skin is warm and dry.       Neurological:     General: No focal deficit present.     Mental Status: He is alert. Mental status is at baseline.  Psychiatric:        Mood and Affect: Mood normal.        Behavior: Behavior normal.      Labs:  CBC Latest Ref Rng & Units 07/02/2019 07/01/2019 06/30/2019  WBC 4.0 - 10.5 K/uL 9.5 11.2(H) 12.3(H)  Hemoglobin 13.0 - 17.0 g/dL 9.4(L) 9.4(L) 9.5(L)  Hematocrit 39.0 - 52.0 % 31.8(L) 30.3(L) 30.8(L)  Platelets 150 - 400 K/uL 280 284 280   CMP Latest Ref Rng & Units 07/02/2019 07/01/2019 06/30/2019  Glucose 70 - 99 mg/dL 113(H) 125(H) 140(H)  BUN 8 - 23 mg/dL  18 19 18   Creatinine 0.61 - 1.24 mg/dL 0.66 0.70 0.83  Sodium 135 - 145 mmol/L 143 143 140  Potassium 3.5 - 5.1 mmol/L 3.5 3.5 3.6  Chloride 98 - 111 mmol/L 101 99 98  CO2 22 - 32 mmol/L 30 31 28   Calcium 8.9 - 10.3 mg/dL 8.9 8.9 8.6(L)  Total Protein 6.5 - 8.1 g/dL - - -  Total Bilirubin 0.3 - 1.2 mg/dL - - -  Alkaline Phos 38 - 126 U/L - - -  AST 15 - 41 U/L - - -  ALT 0 - 44 U/L - - -    Imaging studies:   CT Pelvis (06/22/2019) personally reviewed and agree with radiologist report which is reviewed below:  IMPRESSION: 1. Large decubitus ulcer overlying the lower sacrum and coccyx with underlying bone resorption consistent with osteomyelitis. Suspect combination of acute on chronic osteomyelitis. No abscess. 2. No other acute abnormalities. 3. Small amount of ascites and subcutaneous edema.   Assessment/Plan: (ICD 10's: L53.154) 68 y.o. male with stage 4 decubitus sacral ulceration with chronic osteomyelitits, complicated by pertinent comorbidities including severe physical deconditioning and significant cardiac history.   - Agree that palliative care consult may be warranted to establish goals of care  - Will continue current wound management plan; wound vac MWF, rectal tube for stool management, frequent repositioning  - Continue IV ABx; appreciate ID recommendations   - Diverting colostomy is certainly an option, he seems to be interested.  His cardiac history and previous abdominal surgeries make this complicated. I do think his deconditioning is major factor preventing wound healing. I spoke about the procedure at length and he does seem interested. I do think it would be pertinent to establish goals of care prior to surgery, we do need to have anticoagulation held for 48 hours prior. I weill discuss with Dr Christian Mate today --> possible OR tomorrow -vs- thursday  - Further management with primary team; we will follow  - DVT prophylaxis; hold for potential intervention  All  of the above findings and recommendations were discussed with the patient and the medical team, and all of patient's questions were answered to his expressed satisfaction.  Thank you for the opportunity to participate in this patient's care.   -- Edison Simon, PA-C [facility] 07/02/2019, 7:18 AM [phone] M-F: 7am - 4pm

## 2019-07-02 NOTE — Consult Note (Signed)
[facility] [place] for Electrolyte Monitoring and Replacement   Colton Lamb is a 60 YOM with a hx of CHF, CAD, HTN, dyslipidemia, and Crohn's Disease who presented to the ED with acute onset of worsening dyspnea associated with dry cough and wheezing. He was placed on BiPAP and admitted to stepdown for further evaluation.  Pharmacy has been consulted for electrolyte monitoring and replacement.  Patient is now receiving: -Lasix PO 60 mg BID -Digoxin 0.125 mg QD -KCL PO 66mq QD  -Spironolactone 12.5 mg QD  He is on a carb modified diet and also getting NEPRO CARB STEADY 2386mBID between meals  Recent Labs: Potassium (mmol/L)  Date Value  07/02/2019 3.5  11/22/2013 3.7   Magnesium (mg/dL)  Date Value  07/02/2019 2.1  11/22/2013 1.8   Calcium (mg/dL)  Date Value  07/02/2019 8.9   Calcium, Total (mg/dL)  Date Value  11/22/2013 9.0   Albumin (g/dL)  Date Value  06/12/2019 2.9 (L)  04/18/2018 4.3   Phosphorus (mg/dL)  Date Value  06/22/2019 3.7   Sodium (mmol/L)  Date Value  07/02/2019 143  05/09/2016 141  11/22/2013 133 (L)   Corrected Calcium:   Goals of Therapy:  - K ~ 4 - Mg ~ 2 - Other electrolytes WNL  Assessment/Plan: Pt 4067masix IV q8h changed to 70m49m bid on 11/29- spironolactone, digoxin. Patient was started on KCL 20 meq PO daily while on lasix 11/28.  K 3.5; Scr 0.66 -Will order an additional KCL PO 30mE2m dose for 1400 today. Will follow electrolytes with AM labs.    Thank you for allowing pharmacy to be a part of this patient's care.   AsajaRowland LathermD Clinical Pharmacist 07/02/2019 7:39 AM

## 2019-07-02 NOTE — Progress Notes (Signed)
Physical Therapy Treatment Patient Details Name: Colton Lamb MRN: [id number] DOB: [birth year] Today's Date: 07/02/2019    History of Present Illness Nihar Klus presented to ER secondary to respiratory distress; admitted for management of acute hypoxic/hypercarbic respiratory failure (requiring BiPAP at initial presentation).  Of note, patient with large, chronic stage IV sacral decubitus ulcer.    PT Comments    Pt in bed upon arrival and agreeable to participate with PT. This session primarily focused on strengthening via therex. Pt performed supine LE therex and UE therex. Pt required min cuing for correct performance. Pt reporting fatigue and needing a few rest breaks during therex. UE therex performed against therapist resistance. SpO2 monitored during therex and 98% +.  Pt presents with decreased strength, balance, ROM and activity tolerance limiting functional mobility. Pt would benefit from further skilled PT to improve deficits and independence with mobility. Recommendation of SNF remains appropriate to further improve deficits, maximize mobility, and decrease caregiver burden.    Follow Up Recommendations  SNF     Equipment Recommendations  None recommended by PT    Recommendations for Other Services       Precautions / Restrictions Precautions Precautions: Fall Precaution Comments: sacral wound stage 4 Restrictions Weight Bearing Restrictions: No    Mobility  Bed Mobility Overal bed mobility: (Deferred at pt request. Had just worked with PT, also pt noted to have disconnected wound vac RN notified but not in room to assess/clear for additional activity by end of OT session.)             General bed mobility comments: this session focused on therex  Transfers                    Ambulation/Gait                 Stairs             Wheelchair Mobility    Modified Rankin (Stroke Patients Only)       Balance Overall balance  assessment: Needs assistance Sitting-balance support: Feet supported;Bilateral upper extremity supported Sitting balance-Leahy Scale: Poor     Standing balance support: Bilateral upper extremity supported Standing balance-Leahy Scale: Zero                              Cognition Arousal/Alertness: Awake/alert Behavior During Therapy: WFL for tasks assessed/performed Overall Cognitive Status: Within Functional Limits for tasks assessed                                 General Comments:       Exercises Total Joint Exercises Ankle Circles/Pumps: AROM;Both;20 reps Gluteal Sets: AROM;Both;10 reps Towel Squeeze: AROM;Both;10 reps Short Arc Quad: AROM;Both;10 reps Heel Slides: AROM;Both;10 reps Hip ABduction/ADduction: AROM;Both;10 reps Straight Leg Raises: AROM;Both;10 reps Bridges: AROM;Both;10 reps(minimally able to lift bottom off mattress) General Exercises - Upper Extremity Elbow Flexion: AROM;Both;10 reps;Supine(against therapist resistance) Elbow Extension: AROM;Both;10 reps;Supine(against therapist resistance) Other Exercises Other Exercises: single arm chest press against therapist resistance in supine x10 each arm Other Exercises:     General Comments General comments (skin integrity, edema, etc.): rectal tube in place t/os session      Pertinent Vitals/Pain Pain Assessment: No/denies pain Faces Pain Scale: Hurts a little bit Pain Location: lower legs Pain Descriptors / Indicators: Grimacing;Guarding Pain Intervention(s): Limited activity within patient's tolerance;Monitored during session  Home Living                      Prior Function            PT Goals (current goals can now be found in the care plan section) Acute Rehab PT Goals Patient Stated Goal: to get stronger and get home Progress towards PT goals: Goals downgraded-see care plan    Frequency    Min 2X/week      PT Plan Current plan remains appropriate     Co-evaluation              AM-PAC PT "6 Clicks" Mobility   Outcome Measure  Help needed turning from your back to your side while in a flat bed without using bedrails?: A Lot Help needed moving from lying on your back to sitting on the side of a flat bed without using bedrails?: A Lot Help needed moving to and from a bed to a chair (including a wheelchair)?: Total Help needed standing up from a chair using your arms (e.g., wheelchair or bedside chair)?: Total Help needed to walk in hospital room?: Total Help needed climbing 3-5 steps with a railing? : Total 6 Click Score: 8    End of Session   Activity Tolerance: Patient tolerated treatment well Patient left: in bed;with call bell/phone within reach Nurse Communication: Mobility status PT Visit Diagnosis: Muscle weakness (generalized) (M62.81);Difficulty in walking, not elsewhere classified (R26.2)     Time: 2763-9432 PT Time Calculation (min) (ACUTE ONLY): 19 min  Charges:  $Therapeutic Exercise: 8-22 mins                     Rudie Sermons PT, DPT 4:08 PM,07/02/19 [phone]    Conroy Goracke Drucilla Chalet 07/02/2019, 4:04 PM

## 2019-07-02 NOTE — Progress Notes (Signed)
Progress Note  Patient Name: Colton Lamb Date of Encounter: 07/02/2019  Primary Cardiologist: Ida Rogue, MD   Subjective   No chest pain, shortness of breath, palpitations, or lightheadedness.  Minimal leg swelling.  Inpatient Medications    Scheduled Meds: . atorvastatin  80 mg Oral Daily  . Chlorhexidine Gluconate Cloth  6 each Topical Q0600  . digoxin  0.125 mg Oral Daily  . feeding supplement (NEPRO CARB STEADY)  237 mL Oral BID BM  . finasteride  5 mg Oral Daily  . furosemide  60 mg Oral BID  . insulin aspart  0-9 Units Subcutaneous TID WC  . insulin detemir  9 Units Subcutaneous Daily  . lactobacillus  1 g Oral TID WC  . metoprolol succinate  25 mg Oral Daily  . midodrine  5 mg Oral TID  . multivitamin-lutein   Oral Daily  . niacin  1,000 mg Oral Daily  . pantoprazole  40 mg Oral Daily  . potassium chloride  30 mEq Oral Once  . potassium chloride  20 mEq Oral Daily  . sodium chloride flush  10-40 mL Intracatheter Q12H  . spironolactone  12.5 mg Oral Daily  . tamsulosin  0.4 mg Oral QPC breakfast  . vitamin C  500 mg Oral BID   Continuous Infusions: . sodium chloride Stopped (06/29/19 1035)  . piperacillin-tazobactam (ZOSYN)  IV 3.375 g (07/02/19 0503)   PRN Meds: sodium chloride, acetaminophen, albuterol, ALPRAZolam, guaiFENesin-dextromethorphan, ondansetron (ZOFRAN) IV, sodium chloride flush, zolpidem   Vital Signs    Vitals:   07/01/19 1640 07/01/19 2025 07/02/19 0454 07/02/19 0805  BP: 112/73 109/68 127/71 108/67  Pulse: 80 82 83 84  Resp: 17 19 18 18   Temp: 98.6 F (37 C) 98.3 F (36.8 C) (!) 97.5 F (36.4 C) 97.9 F (36.6 C)  TempSrc:  Oral Oral Oral  SpO2: 95% 94% 94% 95%  Weight:      Height:        Intake/Output Summary (Last 24 hours) at 07/02/2019 0931 Last data filed at 07/02/2019 0505 Gross per 24 hour  Intake 581.9 ml  Output 750 ml  Net -168.1 ml   Last 3 Weights 06/30/2019 06/30/2019 06/29/2019  Weight (lbs) (No Data)  (No Data) 122 lb  Weight (kg) (No Data) (No Data) 55.339 kg      Telemetry    Not on telemetry.  ECG    No new tracing.  Physical Exam   GEN: No acute distress.   Neck: Difficult to assess JVP due to supine positioning. Cardiac:  Distant heart sounds.  Irregularly irregular without murmurs. Respiratory:  Coarse breath sounds anteriorly.  Unable to auscultate posteriorly as patient is unable to sit up. GI: Soft, nontender, non-distended  MS:  Trace pitting dependent edema in both legs. Neuro:  Nonfocal  Psych: Normal affect   Labs    High Sensitivity Troponin:   Recent Labs  Lab 06/12/19 0100 06/24/19 1258  TROPONINIHS 34* 16      Chemistry Recent Labs  Lab 06/30/19 0523 07/01/19 0450 07/02/19 0457  NA 140 143 143  K 3.6 3.5 3.5  CL 98 99 101  CO2 28 31 30   GLUCOSE 140* 125* 113*  BUN 18 19 18   CREATININE 0.83 0.70 0.66  CALCIUM 8.6* 8.9 8.9  GFRNONAA >60 >60 >60  GFRAA >60 >60 >60  ANIONGAP 14 13 12      Hematology Recent Labs  Lab 06/30/19 0523 07/01/19 0450 07/02/19 0457  WBC 12.3* 11.2* 9.5  RBC 3.79* 3.72* 3.79*  HGB 9.5* 9.4* 9.4*  HCT 30.8* 30.3* 31.8*  MCV 81.3 81.5 83.9  MCH 25.1* 25.3* 24.8*  MCHC 30.8 31.0 29.6*  RDW 18.6* 18.9* 19.0*  PLT 280 284 280    BNPNo results for input(s): BNP, PROBNP in the last 168 hours.   DDimer No results for input(s): DDIMER in the last 168 hours.   Radiology    No results found.  Cardiac Studies   Echo  06/25/2019 1. Left ventricular ejection fraction, by visual estimation, is 20 to 25%. The left ventricle has severely decreased function. There is mildly increased left ventricular hypertrophy. 2. Severely dilated left ventricular internal cavity size. 3. There is global hypokinesis with thinning/akinesis of the anterior and septal walls. 4. Global right ventricle was not well visualized.The right ventricular size is not assessed. Right vetricular wall thickness was not assessed. 5. Left  atrial size was not assessed. 6. Right atrial size was not assessed. 7. Small pericardial effusion. 8. The pericardial effusion is circumferential. 9. Mild mitral annular calcification. 10. The mitral valve was not assessed. No assessment of mitral valve regurgitation. 11. The tricuspid valve is not assessed. Tricuspid valve regurgitation was not assessed. 12. Aortic valve regurgitation was not assessed. 13. The aortic valve was not assessed. Aortic valve regurgitation was not assessed. 14. The pulmonic valve was not well visualized. Pulmonic valve regurgitation was not assessed. 15. Aortic root could not be assessed. 16. The interatrial septum was not assessed.  Echo 02/08/2019 1. The left ventricle has moderately reduced systolic function, with an ejection fraction of 35-40%. The cavity size was normal. Left ventricular diastolic Doppler parameters are indeterminate. Probably severe hypokinesis of mid-distal anterior,  ateroseptal and apical myocardium. 2. The right ventricle has normal systolic function. The cavity was normal. There is no increase in right ventricular wall thickness. 3. Left atrial size was moderately dilated. 4. Very poor image quality in spite of using Definity. Valves not well visualized. Consider different diagnostic testing if indicated. 5. The aortic valve was not well visualized. Aortic valve regurgitation was not assessed by color flow Doppler.  Patient Profile     68 y.o. male with history of coronary artery disease status post prior PCI's to the LAD and LCx/OM, acute on chronic systolic heart failure, persistent atrial fibrillation, and atrial flutter status post ablation.  Assessment & Plan    Acute on chronic systolic and diastolic heart failure: Volume status continues to improve gradually, with only trace edema noted on exam today.  Continue furosemide 60 mg p.o. twice daily.  Discontinue midodrine to minimize afterload.  Continue digoxin,  metoprolol succinate 25 mg daily, and spironolactone 12.5 mg daily.  If blood pressure tolerates after discontinuation of midodrine, we would aim to add low-dose ARB in the coming days.  Coronary artery disease: No angina reported.  Given further decline in LVEF and history of multivessel CAD status post PCI, worsening ischemia is certainly a concern.  Anticipate right and left heart catheterization at some point, ideally after heart failure has been optimized and sacral decubitus ulcer with chronic osteomyelitis treated.  Add aspirin 81 mg daily in the setting of rivaroxaban being on hold, if okay with IM and surgery.  Continue low-dose metoprolol and atorvastatin.  Persistent atrial fibrillation: Heart rates reasonably well controlled based on vital signs; patient not currently on telemetry.  Continue digoxin and metoprolol.  Rivaroxaban appears to have been discontinued after yesterday's dose.  Recommend restarting when possible versus transitioning to IV heparin  if oral anticoagulation needs to be held for an extended period and there is no evidence of ongoing bleeding.  Hypotension: Blood pressure in the normal range.  Given severely reduced LVEF, it is expected for his blood pressure to be soft.  I favor minimizing afterload increasing agents such as midodrine, as these could potentially worsen heart failure.  Discontinue midodrine; defer restarting unless blood pressure drops significantly (consistently less than 90/50) or the patient develops symptoms of hypotension.  Sacral decubitus ulcer and chronic osteomyelitis: Wound VAC remains in place, with wound being followed by surgery and ID.  ID has recommended diverting ostomy to minimize risk for further fecal contamination.  Continue management per ID, surgery, and internal medicine.  If diverting colostomy is necessary, I think it is reasonable to proceed from a cardiac standpoint.  Given recent acute heart failure with  severely reduced LVEF, Mr. Granlund is at elevated risk for perioperative cardiovascular complications.  However, he is optimized at this time, and additional interventions are unlikely to further mitigate his perioperative risk.  For questions or updates, please contact [place] Please consult [email or web address] for contact info under [facility] Cardiology.  Signed, Nelva Bush, MD  07/02/2019, 9:31 AM

## 2019-07-03 ENCOUNTER — Inpatient Hospital Stay: Payer: Medicare Other | Admitting: Certified Registered"

## 2019-07-03 ENCOUNTER — Encounter: Payer: Self-pay | Admitting: *Deleted

## 2019-07-03 ENCOUNTER — Encounter
Admission: EM | Disposition: A | Discharge status: Skilled Nursing Facility | Payer: Self-pay | Source: Home / Self Care | Attending: Internal Medicine

## 2019-07-03 DIAGNOSIS — G9340 Encephalopathy, unspecified: Secondary | ICD-10-CM

## 2019-07-03 DIAGNOSIS — I4819 Other persistent atrial fibrillation: Secondary | ICD-10-CM | POA: Diagnosis not present

## 2019-07-03 DIAGNOSIS — M4628 Osteomyelitis of vertebra, sacral and sacrococcygeal region: Secondary | ICD-10-CM

## 2019-07-03 DIAGNOSIS — E1169 Type 2 diabetes mellitus with other specified complication: Secondary | ICD-10-CM

## 2019-07-03 DIAGNOSIS — Z933 Colostomy status: Secondary | ICD-10-CM

## 2019-07-03 DIAGNOSIS — I9581 Postprocedural hypotension: Secondary | ICD-10-CM

## 2019-07-03 DIAGNOSIS — I5043 Acute on chronic combined systolic (congestive) and diastolic (congestive) heart failure: Secondary | ICD-10-CM | POA: Diagnosis not present

## 2019-07-03 HISTORY — PX: TRANSVERSE LOOP COLOSTOMY: SHX6478

## 2019-07-03 LAB — CBC WITH DIFFERENTIAL/PLATELET
Abs Immature Granulocytes: 0.04 10*3/uL (ref 0.00–0.07)
Basophils Absolute: 0.1 10*3/uL (ref 0.0–0.1)
Basophils Relative: 1 %
Eosinophils Absolute: 0.6 10*3/uL — ABNORMAL HIGH (ref 0.0–0.5)
Eosinophils Relative: 6 %
HCT: 31.8 % — ABNORMAL LOW (ref 39.0–52.0)
Hemoglobin: 9.4 g/dL — ABNORMAL LOW (ref 13.0–17.0)
Immature Granulocytes: 0 %
Lymphocytes Relative: 13 %
Lymphs Abs: 1.3 10*3/uL (ref 0.7–4.0)
MCH: 25 pg — ABNORMAL LOW (ref 26.0–34.0)
MCHC: 29.6 g/dL — ABNORMAL LOW (ref 30.0–36.0)
MCV: 84.6 fL (ref 80.0–100.0)
Monocytes Absolute: 0.5 10*3/uL (ref 0.1–1.0)
Monocytes Relative: 5 %
Neutro Abs: 7.2 10*3/uL (ref 1.7–7.7)
Neutrophils Relative %: 75 %
Platelets: 262 10*3/uL (ref 150–400)
RBC: 3.76 MIL/uL — ABNORMAL LOW (ref 4.22–5.81)
RDW: 19.2 % — ABNORMAL HIGH (ref 11.5–15.5)
WBC: 9.7 10*3/uL (ref 4.0–10.5)
nRBC: 0 % (ref 0.0–0.2)

## 2019-07-03 LAB — BLOOD GAS, ARTERIAL
Acid-Base Excess: 4 mmol/L — ABNORMAL HIGH (ref 0.0–2.0)
Bicarbonate: 31.9 mmol/L — ABNORMAL HIGH (ref 20.0–28.0)
Delivery systems: POSITIVE
Expiratory PAP: 5
FIO2: 0.28
Inspiratory PAP: 15
O2 Saturation: 95.5 %
Patient temperature: 37
pCO2 arterial: 62 mmHg — ABNORMAL HIGH (ref 32.0–48.0)
pH, Arterial: 7.32 — ABNORMAL LOW (ref 7.350–7.450)
pO2, Arterial: 85 mmHg (ref 83.0–108.0)

## 2019-07-03 LAB — GLUCOSE, CAPILLARY
Glucose-Capillary: 98 mg/dL (ref 70–99)
Glucose-Capillary: 95 mg/dL (ref 70–99)
Glucose-Capillary: 146 mg/dL — ABNORMAL HIGH (ref 70–99)
Glucose-Capillary: 182 mg/dL — ABNORMAL HIGH (ref 70–99)
Glucose-Capillary: 157 mg/dL — ABNORMAL HIGH (ref 70–99)

## 2019-07-03 LAB — BASIC METABOLIC PANEL
Anion gap: 13 (ref 5–15)
BUN: 19 mg/dL (ref 8–23)
CO2: 32 mmol/L (ref 22–32)
Calcium: 8.8 mg/dL — ABNORMAL LOW (ref 8.9–10.3)
Chloride: 99 mmol/L (ref 98–111)
Creatinine, Ser: 0.93 mg/dL (ref 0.61–1.24)
GFR calc Af Amer: >60 mL/min (ref >60)
GFR calc non Af Amer: >60 mL/min (ref >60)
Glucose, Bld: 109 mg/dL — ABNORMAL HIGH (ref 70–99)
Potassium: 3.4 mmol/L — ABNORMAL LOW (ref 3.5–5.1)
Sodium: 144 mmol/L (ref 135–145)

## 2019-07-03 SURGERY — CREATION, COLOSTOMY, LOOP, TRANSVERSE COLON
Anesthesia: General

## 2019-07-03 MED ORDER — LOSARTAN POTASSIUM 25 MG PO TABS
12.5000 mg | ORAL_TABLET | Freq: Every day | ORAL | Status: DC
  Filled 2019-07-03: qty 0.5

## 2019-07-03 MED ORDER — PIPERACILLIN-TAZOBACTAM 3.375 G IVPB
INTRAVENOUS | Status: AC
  Filled 2019-07-03: qty 50

## 2019-07-03 MED ORDER — ONDANSETRON HCL 4 MG/2ML IJ SOLN: 4.0000 mg | Freq: Once | INTRAMUSCULAR | Status: DC | PRN

## 2019-07-03 MED ORDER — SODIUM CHLORIDE 0.9 % IV SOLN
INTRAVENOUS | Status: DC | PRN
  Administered 2019-07-03: 50 ug/min via INTRAVENOUS

## 2019-07-03 MED ORDER — PROPOFOL 10 MG/ML IV BOLUS
INTRAVENOUS | Status: AC
  Filled 2019-07-03: qty 20

## 2019-07-03 MED ORDER — DEXAMETHASONE SODIUM PHOSPHATE 10 MG/ML IJ SOLN
INTRAMUSCULAR | Status: DC | PRN
  Administered 2019-07-03: 5 mg via INTRAVENOUS

## 2019-07-03 MED ORDER — NALOXONE HCL 0.4 MG/ML IJ SOLN
INTRAMUSCULAR | Status: DC | PRN
  Administered 2019-07-03: 0.4 mg via INTRAVENOUS

## 2019-07-03 MED ORDER — MIDAZOLAM HCL 2 MG/2ML IJ SOLN
INTRAMUSCULAR | Status: AC
  Filled 2019-07-03: qty 2

## 2019-07-03 MED ORDER — FENTANYL CITRATE (PF) 100 MCG/2ML IJ SOLN
INTRAMUSCULAR | Status: AC
  Filled 2019-07-03: qty 2

## 2019-07-03 MED ORDER — FENTANYL CITRATE (PF) 100 MCG/2ML IJ SOLN: 25.0000 ug | INTRAMUSCULAR | Status: DC | PRN

## 2019-07-03 MED ORDER — NOREPINEPHRINE BITARTRATE 1 MG/ML IV SOLN
2.0000 ug/min | INTRAVENOUS | Status: DC
  Filled 2019-07-03 (×2): qty 4

## 2019-07-03 MED ORDER — GLYCOPYRROLATE 0.2 MG/ML IJ SOLN
INTRAMUSCULAR | Status: DC | PRN
  Administered 2019-07-03: 0.2 mg via INTRAVENOUS

## 2019-07-03 MED ORDER — NALOXONE HCL 0.4 MG/ML IJ SOLN: 0.4000 mg | Freq: Once | INTRAMUSCULAR | Status: DC

## 2019-07-03 MED ORDER — HYDROCODONE-ACETAMINOPHEN 5-325 MG PO TABS: 1.0000 | ORAL_TABLET | ORAL | Status: DC | PRN

## 2019-07-03 MED ORDER — PROPOFOL 10 MG/ML IV BOLUS
INTRAVENOUS | Status: DC | PRN
  Administered 2019-07-03: 100 mg via INTRAVENOUS

## 2019-07-03 MED ORDER — POTASSIUM CHLORIDE CRYS ER 20 MEQ PO TBCR: 40.0000 meq | EXTENDED_RELEASE_TABLET | Freq: Once | ORAL | Status: DC

## 2019-07-03 MED ORDER — BUPIVACAINE-EPINEPHRINE (PF) 0.25% -1:200000 IJ SOLN
INTRAMUSCULAR | Status: AC
  Filled 2019-07-03: qty 30

## 2019-07-03 MED ORDER — LIDOCAINE HCL (CARDIAC) PF 100 MG/5ML IV SOSY
PREFILLED_SYRINGE | INTRAVENOUS | Status: DC | PRN
  Administered 2019-07-03: 100 mg via INTRAVENOUS

## 2019-07-03 MED ORDER — FENTANYL CITRATE (PF) 100 MCG/2ML IJ SOLN
INTRAMUSCULAR | Status: DC | PRN
  Administered 2019-07-03: 50 ug via INTRAVENOUS

## 2019-07-03 MED ORDER — VASOPRESSIN 20 UNIT/ML IV SOLN
INTRAVENOUS | Status: DC | PRN
  Administered 2019-07-03: 2 [IU] via INTRAVENOUS
  Administered 2019-07-03: 1 [IU] via INTRAVENOUS
  Administered 2019-07-03 (×2): 2 [IU] via INTRAVENOUS
  Administered 2019-07-03: 1 [IU] via INTRAVENOUS
  Administered 2019-07-03: 2 [IU] via INTRAVENOUS

## 2019-07-03 MED ORDER — SUGAMMADEX SODIUM 500 MG/5ML IV SOLN
INTRAVENOUS | Status: DC | PRN
  Administered 2019-07-03: 400 mg via INTRAVENOUS

## 2019-07-03 MED ORDER — EPHEDRINE SULFATE 50 MG/ML IJ SOLN
INTRAMUSCULAR | Status: DC | PRN
  Administered 2019-07-03 (×3): 5 mg via INTRAVENOUS

## 2019-07-03 MED ORDER — LACTATED RINGERS IV SOLN
INTRAVENOUS | Status: DC | PRN
  Administered 2019-07-03: 11:00:00 via INTRAVENOUS

## 2019-07-03 MED ORDER — NALOXONE HCL 0.4 MG/ML IJ SOLN
INTRAMUSCULAR | Status: AC
  Filled 2019-07-03: qty 1

## 2019-07-03 MED ORDER — SODIUM CHLORIDE 0.9 % IV SOLN
0.0000 ug/min | INTRAVENOUS | Status: DC
  Administered 2019-07-03: 23:00:00 9 ug/min via INTRAVENOUS
  Administered 2019-07-03: 14:00:00 5 ug/min via INTRAVENOUS
  Administered 2019-07-04: 7 ug/min via INTRAVENOUS
  Administered 2019-07-04: 08:00:00 9 ug/min via INTRAVENOUS
  Administered 2019-07-05: 06:00:00 5 ug/min via INTRAVENOUS
  Filled 2019-07-03 (×5): qty 4

## 2019-07-03 MED ORDER — VASOPRESSIN 20 UNIT/ML IV SOLN
INTRAVENOUS | Status: AC
  Filled 2019-07-03: qty 2

## 2019-07-03 MED ORDER — ROCURONIUM BROMIDE 100 MG/10ML IV SOLN
INTRAVENOUS | Status: DC | PRN
  Administered 2019-07-03: 50 mg via INTRAVENOUS
  Administered 2019-07-03: 10 mg via INTRAVENOUS

## 2019-07-03 MED ORDER — PHENYLEPHRINE HCL (PRESSORS) 10 MG/ML IV SOLN
INTRAVENOUS | Status: DC | PRN
  Administered 2019-07-03 (×4): 100 ug via INTRAVENOUS

## 2019-07-03 SURGICAL SUPPLY — 43 items
BLADE SURG 15 STRL LF DISP TIS (BLADE) ×1 IMPLANT
BLADE SURG 15 STRL SS (BLADE) ×2
BNDG GAUZE 4.5X4.1 6PLY STRL (MISCELLANEOUS) ×2 IMPLANT
CANISTER SUCT 1200ML W/VALVE (MISCELLANEOUS) ×3 IMPLANT
CANISTER WOUND CARE 500ML ATS (WOUND CARE) ×4 IMPLANT
CHLORAPREP W/TINT 26 (MISCELLANEOUS) ×3 IMPLANT
COVER WAND RF STERILE (DRAPES) ×3 IMPLANT
DRAIN PENROSE 1/4X12 LTX STRL (WOUND CARE) ×2 IMPLANT
DRAPE INCISE IOBAN 66X45 STRL (DRAPES) IMPLANT
DRAPE LAPAROTOMY 100X77 ABD (DRAPES) ×3 IMPLANT
ELECT BLADE 6.5 EXT (BLADE) ×3 IMPLANT
ELECT CAUTERY BLADE 6.4 (BLADE) ×3 IMPLANT
ELECT REM PT RETURN 9FT ADLT (ELECTROSURGICAL) ×3
ELECTRODE REM PT RTRN 9FT ADLT (ELECTROSURGICAL) ×1 IMPLANT
GAUZE PACKING 1/2X5YD (GAUZE/BANDAGES/DRESSINGS) ×2 IMPLANT
GLOVE ORTHO TXT STRL SZ7.5 (GLOVE) ×12 IMPLANT
GOWN STRL REUS W/ TWL LRG LVL3 (GOWN DISPOSABLE) ×4 IMPLANT
GOWN STRL REUS W/TWL LRG LVL3 (GOWN DISPOSABLE) ×8
HANDLE YANKAUER SUCT BULB TIP (MISCELLANEOUS) IMPLANT
KIT OSTOMY 2 PC DRNBL 2.25 STR (WOUND CARE) IMPLANT
KIT OSTOMY DRAINABLE 2.25 STR (WOUND CARE)
KIT OSTOMY DRAINABLE 2.75 STR (WOUND CARE) IMPLANT
KIT OSTOMY DRAINABLE 3.25 STR (WOUND CARE) ×2 IMPLANT
KIT TURNOVER KIT A (KITS) ×3 IMPLANT
LABEL OR SOLS (LABEL) ×3 IMPLANT
LIGASURE IMPACT 36 18CM CVD LR (INSTRUMENTS) IMPLANT
LOOP OSTOMY BRIDGE (OSTOMY) ×2 IMPLANT
NEEDLE HYPO 22GX1.5 SAFETY (NEEDLE) ×3 IMPLANT
NS IRRIG 1000ML POUR BTL (IV SOLUTION) ×3 IMPLANT
PACK BASIN MAJOR ARMC (MISCELLANEOUS) ×3 IMPLANT
PACK COLON CLEAN CLOSURE (MISCELLANEOUS) ×3 IMPLANT
SHEARS HARMONIC STRL 23CM (MISCELLANEOUS) IMPLANT
SPONGE LAP 18X18 RF (DISPOSABLE) ×3 IMPLANT
STAPLER SKIN PROX 35W (STAPLE) ×2 IMPLANT
SUT PDS AB 0 CT1 27 (SUTURE) ×2 IMPLANT
SUT SILK 2 0 (SUTURE) ×2
SUT SILK 2-0 18XBRD TIE 12 (SUTURE) ×1 IMPLANT
SUT SILK 3-0 (SUTURE) ×3 IMPLANT
SUT VIC AB 3-0 SH 27 (SUTURE) ×2
SUT VIC AB 3-0 SH 27X BRD (SUTURE) IMPLANT
SYR 10ML LL (SYRINGE) ×3 IMPLANT
TAPE MICROFOAM 4IN (TAPE) ×2 IMPLANT
TRAY FOLEY MTR SLVR 16FR STAT (SET/KITS/TRAYS/PACK) ×3 IMPLANT

## 2019-07-03 NOTE — Progress Notes (Signed)
Patient extubated by Dr. Randa Lynn

## 2019-07-03 NOTE — Progress Notes (Signed)
CO2 80 per ABG, unable to reach physician; we are placing patient on BiPap

## 2019-07-03 NOTE — Progress Notes (Signed)
PT Cancellation Note  Patient Details Name: Colton Lamb MRN: [id number] DOB: October 01, 1950   Cancelled Treatment:    Reason Eval/Treat Not Completed: (Patient status post surgical procedure under general anesthesia.  Per guidelines, will require new orders to resume PT services post-op.  Please re-consult as medically appropriate.)   Raylyn Carton H. Owens Shark, PT, DPT, NCS 07/03/19, 11:28 PM [phone]

## 2019-07-03 NOTE — Consult Note (Signed)
[place] for Electrolyte Monitoring and Replacement   Colton Lamb is a 24 YOM with a hx of CHF, CAD, HTN, dyslipidemia, and Crohn's Disease who presented to the ED with acute onset of worsening dyspnea associated with dry cough and wheezing. He was placed on BiPAP and admitted to stepdown for further evaluation.  Pharmacy has been consulted for electrolyte monitoring and replacement.  Patient is now receiving: -Lasix PO 60 mg BID -Digoxin 0.125 mg QD -KCL PO 59mq QD  -Spironolactone 12.5 mg QD  He is on a carb modified diet and also getting NEPRO CARB STEADY 2387mBID between meals  Recent Labs: Potassium (mmol/L)  Date Value  07/03/2019 3.4 (L)  11/22/2013 3.7   Magnesium (mg/dL)  Date Value  07/02/2019 2.1  11/22/2013 1.8   Calcium (mg/dL)  Date Value  07/03/2019 8.8 (L)   Calcium, Total (mg/dL)  Date Value  11/22/2013 9.0   Albumin (g/dL)  Date Value  06/12/2019 2.9 (L)  04/18/2018 4.3   Phosphorus (mg/dL)  Date Value  06/22/2019 3.7   Sodium (mmol/L)  Date Value  07/03/2019 144  05/09/2016 141  11/22/2013 133 (L)   Corrected Calcium:   Goals of Therapy:  - K ~ 4 - Mg ~ 2 - Other electrolytes WNL  Assessment/Plan: Pt 4021masix IV q8h changed to 23m21m bid on 11/29- spironolactone, digoxin. Patient was started on KCL 20 meq PO daily while on lasix 11/28. Per chart review, patient had KCL PO 20 mEqX2 dose (though ordered for once daily) and KCL 30 mEq x1.   K 3.4; Scr 0.93 -Will order an additional KCL PO 40mE82m dose for 1400 today. Will follow electrolytes with AM labs.    Thank you for allowing pharmacy to be a part of this patient's care.   AsajaRowland LathermD Clinical Pharmacist 07/03/2019 7:14 AM

## 2019-07-03 NOTE — Anesthesia Preprocedure Evaluation (Signed)
Anesthesia Evaluation  Patient identified by MRN, date of birth, ID band Patient awake    Reviewed: Allergy & Precautions, NPO status , Patient's Chart, lab work & pertinent test results  History of Anesthesia Complications Negative for: history of anesthetic complications  Airway Mallampati: III  TM Distance: >3 FB Neck ROM: Full    Dental  (+) Edentulous Upper, Edentulous Lower   Pulmonary shortness of breath, sleep apnea ,    breath sounds clear to auscultation- rhonchi (-) wheezing      Cardiovascular hypertension, + CAD, + Past MI, + Cardiac Stents and +CHF (EF 20-25%)  + dysrhythmias (paroxysmal afib) + pacemaker (per cardiology, pt is not pacemaker dependent   ) + Cardiac Defibrillator  Rhythm:Regular Rate:Normal - Systolic murmurs and - Diastolic murmurs    Neuro/Psych neg Seizures negative neurological ROS  negative psych ROS   GI/Hepatic Neg liver ROS, GERD  ,  Endo/Other  diabetes, Oral Hypoglycemic Agents  Renal/GU negative Renal ROS     Musculoskeletal   Abdominal (+) + obese,   Peds  Hematology  (+) anemia ,   Anesthesia Other Findings Past Medical History: No date: Atrial flutter ([place])     Comment:  a. s/p Cardioversion 11/22/13, on amiodarone and Xarelto. No date: CHF (congestive heart failure) ([place]) 06/30/2019: Chronic osteomyelitis ([place]) No date: Chronic systolic heart failure ([place])     Comment:  a. 10/2013 EF 20-25%, grade III DD, RV mildly dilated and              sys fx mild/mod reduced;  b. 01/2014 Echo: EF 30-35%, gr3               DD, mod dil LA. No date: Coronary artery disease     Comment:  a. s/p MI 2007/2015;  b. s/p prior PCI to the               LAD/LCX/PDA/PL;  c. 2008: s/p Cypher DES to the OM. No date: Crohn's ileocolitis ([place]) No date: GERD (gastroesophageal reflux disease) 11/2003: Hx of adenomatous colonic polyps No date: Hyperlipidemia No date: Hypertension No date: Ischemic  cardiomyopathy     Comment:  s. 01/2014 s/p MDT [id number] [name] XT DR single lead AICD. No date: Obesity No date: Paroxysmal atrial fibrillation (HCC)     Comment:  a. CHA2DS2VASc = 4-->xarelto/amio. No date: Sleep apnea No date: Syncope     Comment:  a.  11/2013 in setting of volume depletion and               bradycardia due to dig toxicity  No date: Type II diabetes mellitus (HCC)   Reproductive/Obstetrics                             Anesthesia Physical Anesthesia Plan  ASA: III  Anesthesia Plan: General   Post-op Pain Management:    Induction: Intravenous  PONV Risk Score and Plan: 1  Airway Management Planned: Oral ETT  Additional Equipment:   Intra-op Plan:   Post-operative Plan: Extubation in OR and Possible Post-op intubation/ventilation  Informed Consent: I have reviewed the patients History and Physical, chart, labs and discussed the procedure including the risks, benefits and alternatives for the proposed anesthesia with the patient or authorized representative who has indicated his/her understanding and acceptance.     Dental advisory given  Plan Discussed with: CRNA and Anesthesiologist  Anesthesia Plan Comments: (Pt with pacemaker/ICD, not pacemaker dependent, plan to  use magnet to turn off ICD function)        Anesthesia Quick Evaluation

## 2019-07-03 NOTE — Progress Notes (Signed)
Pt awake, able to nod head to questions. Not breathing over ventilator at this time. Will continue to monitor in PACU intubated at this time.

## 2019-07-03 NOTE — Progress Notes (Signed)
Date of Admission:  06/12/2019   T   ID: Colton Lamb is a 68 y.o. male  Principal Problem:   Acute on chronic combined systolic and diastolic CHF (congestive heart failure) (HCC) Active Problems:   CAD S/P percutaneous coronary angioplasty - multiple PCIs   Atrial fibrillation ([place])   CROHN'S DISEASE-LARGE & SMALL INTESTINE   ICD (implantable cardioverter-defibrillator) in place   Diabetes mellitus type 2, uncontrolled, with complications ([place])   Acute respiratory failure (HCC)   Anemia   Stage IV pressure ulcer of sacral region ([place])   Hypotension   Normocytic anemia   PICC (peripherally inserted central catheter) flush   Chronic osteomyelitis ([place])    Subjective: Pt had diverting colostomy and post op had some resp issues and had to be reintubated and then extubated. Now in ICU with low BP and on pressor Deeply somnolent  Medications:  . [MAR Hold] aspirin EC  81 mg Oral Daily  . [MAR Hold] atorvastatin  80 mg Oral Daily  . [MAR Hold] Chlorhexidine Gluconate Cloth  6 each Topical Q0600  . [MAR Hold] digoxin  0.125 mg Oral Daily  . [MAR Hold] feeding supplement (NEPRO CARB STEADY)  237 mL Oral TID BM  . [MAR Hold] finasteride  5 mg Oral Daily  . [MAR Hold] furosemide  60 mg Oral BID  . [MAR Hold] insulin aspart  0-9 Units Subcutaneous TID WC  . [MAR Hold] insulin detemir  9 Units Subcutaneous Daily  . [MAR Hold] lactobacillus  1 g Oral TID WC  . [MAR Hold] losartan  12.5 mg Oral Daily  . [MAR Hold] metoprolol succinate  25 mg Oral Daily  . [MAR Hold] multivitamin-lutein   Oral Daily  . [MAR Hold] niacin  1,000 mg Oral Daily  . [MAR Hold] pantoprazole  40 mg Oral Daily  . [MAR Hold] potassium chloride  20 mEq Oral Daily  . [MAR Hold] potassium chloride  40 mEq Oral Once  . [MAR Hold] sodium chloride flush  10-40 mL Intracatheter Q12H  . [MAR Hold] spironolactone  12.5 mg Oral Daily  . [MAR Hold] tamsulosin  0.4 mg Oral QPC breakfast  . [MAR Hold] vitamin C  500  mg Oral BID    Objective: Vital signs in last 24 hours: Temp:  [96.8 F (36 C)-98.5 F (36.9 C)] 96.8 F (36 C) (12/02 1043) Pulse Rate:  [54-93] 80 (12/02 1043) Resp:  [15-20] 20 (12/02 1043) BP: (101-124)/(71-82) 114/71 (12/02 1043) SpO2:  [91 %-100 %] 100 % (12/02 1043) Weight:  [428 kg] 108 kg (12/02 1043)  PHYSICAL EXAM:  General: deeply somnolent, not responding to calling Face flushed Pupils 78m reacting, equal Lungs: b/l air entry Heart: irregualr Abdomen: Soft,colostomy present Neurologic:cannot be assessed Lab Results Recent Labs    07/02/19 0457 07/03/19 0514  WBC 9.5 9.7  HGB 9.4* 9.4*  HCT 31.8* 31.8*  NA 143 144  K 3.5 3.4*  CL 101 99  CO2 30 32  BUN 18 19  CREATININE 0.66 0.93   Liver Panel No results for input(s): PROT, ALBUMIN, AST, ALT, ALKPHOS, BILITOT, BILIDIR, IBILI in the last 72 hours. Sedimentation Rate No results for input(s): ESRSEDRATE in the last 72 hours. C-Reactive Protein No results for input(s): CRP in the last 72 hours.  Microbiology:  Studies/Results: No results found.   Assessment/Plan:  Acute encephalopathy/unresponsiveness due to hypercarbia - Pco2 80, as per nurse she will be placing him on BIPAP S/p diverting colostomy  Hypotension post anesthesia- seen  by intensivist- on pressor  Stage IV sacral decubitus with chronic osteomyelitis  - because of immobility, stool contamination the decubitus progressed over the past 4 months to become stage IV. If it has to heal, he needs to ambulate and as he is unable to do that, he got a diverting colostomy to prevent stool contamination , wound vac and IV antibiotis for 4 weeks ( on zosyn)   Ischemic cardiomyopathy with congestive heart failure with EF of 20 to 25%: Has AICD.  Management as per primary team and cardiologist  Anemia: Has had bleeding from the ulcer in the past and has been transfused.  He is on Xarelto for A. fib  Paroxysmal A. fib/atrial flutter on digoxin,  low-dose metoprolol  Congestive heart failure on furosemide and spironolactone. As he is hypotensive often the most medications are difficult to titrate. He is on midodrine  Diabetes mellitus on Levemir and sliding scale   Chronic urinary Foley catheter since August 2020 at that admission had urinary retention Foley was placed he was also started on tamsulosin and finasteride during that hospitalization.  Recommend getting urology consult to see whether the Foley catheter can be removed.  Patient is unable to follow-up as outpatient because he is between hospital and nursing home for the last 4 months.   Right PICC line placed on 06/15/2019 ?  Discussed the management with his nurse

## 2019-07-03 NOTE — Consult Note (Signed)
[place] Nurse Consult Note: Called to PACU.  VAC had leaked and was removed and ostomy pouch leaked with turning.  Reason for Consult:Replace VAC dressing to sacrum and replace ostomy pouch Wound type:stage 4 pressure injury Pressure Injury POA: Yes Measurement: 9 cm x 7.6 cm x 4 cm with tunneling at 9 o'clock Wound IWP:YKDXI red  Drainage (amount, consistency, odor) moderate serosanguinous   Periwound:intact  Continues to leak stool  Seal was lost in surgery Dressing procedure/placement/frequency: Cleanse with NS.  Barrier ring to distal end closest to rectum. 1 piece foam to sacrum and one to right trochanter for bridge. Cover with drape.  Seal immediately achieved. Change M/W/F WOC Nurse ostomy consult note Stoma type/location: LLQ colostomy with plastic retention rods sutured in place.  Seal was lost when patient was turned over.  Stomal assessment/size: pink and moist, bleeding 1 3/4"  Closer to 2" with retention rods that are not able to be mobilized to fit into pouch.  Peristomal assessment: intact Treatment options for stomal/peristomal skin: Barrier ring and 1 piece flexible flat pouch Output blood only Ostomy pouching: 1pc.flat with barrier ring Education provided: None  Patient sedated Enrolled patient in [place] program: /No [place] team will follow.  Domenic Moras MSN, RN, FNP-BC CWON Wound, Ostomy, Continence Nurse Pager [phone]

## 2019-07-03 NOTE — Progress Notes (Signed)
Pts glasses and cell phone given to sister in law

## 2019-07-03 NOTE — Progress Notes (Signed)
Pt with hx OSA and RR barely registering on monitoring, appears apneic at times, face flushed and pt is diaphoretic with no fever, unable to reach physician, have ordered ABG and called RT to draw

## 2019-07-03 NOTE — Progress Notes (Signed)
PALLIATIVE NOTE:  Palliative referral received for [name] discussion. Patient is off the floor and in preparation for a diverting colostomy per reports and notation. Will attempt to follow up with patient tomorrow if appropriate.   Of note patient has been seen on previous admissions by colleague Crystal, NP (04/25/2019). At that time he requested full code and full scope of care. He was recommended for outpatient palliative support at that time with awareness he may transition to hospice when appropriately ready. Santiago Glad, RN (hospital liaison) has notified me that patient is active under outpatient Palliative.   Detailed notes and recommendations to follow once a completed GOC.   Thank you for your referral and allowing Palliative to assist in [name]'s care.   Alda Lea, AGPCNP-BC Palliative Medicine Team   NO CHARGE

## 2019-07-03 NOTE — Progress Notes (Signed)
Colton Lamb, wound care NP, came to bedside in PACU. Removed packing in decubitus ulcer. Reapplied wound vac.

## 2019-07-03 NOTE — Transfer of Care (Signed)
Immediate Anesthesia Transfer of Care Note  Patient: Colton Lamb  Procedure(s) Performed: TRANSVERSE LOOP COLOSTOMY (N/A )  Patient Location: PACU  Anesthesia Type:General  Level of Consciousness: lethargic, responds to stimulation and Patient remains intubated per anesthesia plan  Airway & Oxygen Therapy: Patient Spontanous Breathing and Patient re-intubated  Post-op Assessment: Report given to RN and Post -op Vital signs reviewed and unstable, Anesthesiologist notified  Post vital signs: Reviewed  Last Vitals:  Vitals Value Taken Time  BP 105/63 07/03/19 1310  Temp    Pulse 67 07/03/19 1315  Resp 0 07/03/19 1315  SpO2 95 % 07/03/19 1315  Vitals shown include unvalidated device data.  Last Pain:  Vitals:   07/03/19 1043  TempSrc: Temporal  PainSc: 2          Complications: No apparent anesthesia complications, pt re-intuibated as discussed and planned w/Dr. Randa Lynn, to PACU attached to vent and monitors -TFH

## 2019-07-03 NOTE — Progress Notes (Signed)
PT Cancellation Note  Patient Details Name: Colton Lamb MRN: [id number] DOB: 1951-07-10   Cancelled Treatment:    Reason Eval/Treat Not Completed: Patient at procedure or test/unavailable.  Pt out of room for colostomy per documentation.  He will need a new order for therapy following general anesthesia.  Will re-attempt at a later time.  [name], PT, DPT  [name] 07/03/2019, 12:39 PM

## 2019-07-03 NOTE — Op Note (Signed)
Loop sigmoid colostomy creation, for fecal diversion.  Pre-operative Diagnosis: Stage IV sacral decubitus with osteomyelitis.  Post-operative Diagnosis: same  Surgeon: Ronny Bacon, M.D., FACS  Anesthesia: General ETA   Findings: As expected from recent imaging, a loop of sigmoid colon approximating anterior abdominal wall via the left periumbilical area.  Estimated Blood Loss: 50 mL                 Specimens: None.          Complications: none              Procedure Details  The patient was seen again in the Holding Room. The benefits, complications, treatment options, and expected outcomes were discussed with the patient. The risks of bleeding, infection, recurrence of symptoms, failure to resolve symptoms, unanticipated injury, prosthetic placement, prosthetic infection, any of which could require further surgery were reviewed with the patient. The likelihood of improving the patient's symptoms with return to their baseline status is reasonable.  The patient and/or family concurred with the proposed plan, giving informed consent.  The patient was taken to Operating Room, identified and the procedure verified.  A Time Out was held and the above information confirmed. The patient was positioned in the supine position. Prior to the induction of general anesthesia, antibiotic scheduling was reviewed. VTE prophylaxis was in place.  Anesthesia was then administered and tolerated well. After the induction, the abdomen was prepped with Chloraprep and draped in the sterile fashion.  A circular skin incision is made approximately 5 cm lateral to the umbilicus on the left.  Subcutaneous tissues were then dissected exposing the anterior fascia.  The anterior rectus fascia was incised with electrocautery vertically, the rectus muscle retracted, and the posterior rectus fascia incised horizontally.  A sponge was utilized to displace the omental fatty tissue medially, making identification of the  sigmoid loop.  With 3 Babcock clamps I was able to mobilize a satisfactory loop of sigmoid colon.  I created a mesenteric window and utilized a Penrose drain as retractor and added additional minimal blunt dissection to lyse any tethering adhesions. I then passed the loop ostomy bridge via the mesenteric window and secured it to the skin, ensuring that there was no twist in the colon. Considering the degree of infection he is currently fighting, and his frailty I felt it wise to defer maturation of the stoma at this time.  We then fashioned an ostomy appliance to protect the serosa and applied it to the anterior abdominal wall once adequate hemostasis was assured. This completed the procedure.  He appeared to tolerate it well. Needle sponge and instrument count reported correct.      Ronny Bacon, MD, FACS

## 2019-07-03 NOTE — Consult Note (Signed)
CRITICAL CARE CONSULTATION NOTE    Name: Colton Lamb MRN: [id number] DOB: 1951/03/21     LOS: 35   Referring physician: Dr. Gaylord Shih   SUBJECTIVE FINDINGS & SIGNIFICANT EVENTS   Patient description:   Colton Lamb  is a 68 y.o. obese Caucasian male with a known history of systolic CHF, coronary artery disease hypertension dyslipidemia and Crohn's disease, presented to the emergency room with acute onset of worsening dyspnea with associated dry cough and wheezing. He he admitted to orthopnea and paroxysmal nocturnal dyspnea as well as dyspnea on exertion with lower extremity edema.  No fever or chills.  No nausea or vomiting.  No dysuria, oliguria or hematuria or flank pain.  No recent exposure to COVID-19.  He was tested negative about a week prior to admission.  Upon presentation to the emergency room, his heart rate was 102 and respiratory to 34 with pulse oximetry of 85% on CPAP and later on 100% on BiPAP.  His BNP was 731 and high-sensitivity troponin I of 34.  CBC showed leukocytosis and anemia with neutrophilia and CMP showed a CO2 of 42 with a BUN of 24 and creatinine 0.98 alk phos of 160 with otherwise normal LFTs except for albumin 2.9.  Venous blood gas with a pH 7.38 and bicarbonate of 50.9.  COVID-19 negative  Portable chest ray showed vascular congestion and small pleural effusions with bibasal atelectasis that are new compared to prior exam.  His AICD was in place.  EKG showed atrial fibrillation with a rapid rate of 110 with left intravesicular block and prolonged QT interval with QTC of 568 MS. patient had evaluation by surgery for stage IV sacral decubitus with osteomyelitis.  Patient underwent transverse loop colostomy and was postoperatively brought back to the PACU however required reintubation  due to increased oxygen requirement and hypoxemia.  Subsequently patient was stabilized and was ultimately extubated however was noted to be hypotensive and required vasopressor infusion for blood pressure support, critical care consultation was placed by anesthesia team for further management.  Lines / Drains: PIV x 4  Cultures / Sepsis markers: n/a  Antibiotics: zosyn   Protocols / Consultants: Surgery & hospitalist service     PAST MEDICAL HISTORY   Past Medical History:  Diagnosis Date   Atrial flutter ([place])    a. s/p Cardioversion 11/22/13, on amiodarone and Xarelto.   CHF (congestive heart failure) (HCC)    Chronic osteomyelitis ([place]) 17/51/0258   Chronic systolic heart failure ([place])    a. 10/2013 EF 20-25%, grade III DD, RV mildly dilated and sys fx mild/mod reduced;  b. 01/2014 Echo: EF 30-35%, gr3 DD, mod dil LA.   Coronary artery disease    a. s/p MI 2007/2015;  b. s/p prior PCI to the LAD/LCX/PDA/PL;  c. 2008: s/p Cypher DES to the OM.   Crohn's ileocolitis ([place])    GERD (gastroesophageal reflux disease)    Hx of adenomatous colonic polyps 11/2003   Hyperlipidemia    Hypertension    Ischemic cardiomyopathy    s. 01/2014 s/p MDT [id number] [name] XT DR single lead AICD.   Obesity    Paroxysmal atrial fibrillation (HCC)    a. CHA2DS2VASc = 4-->xarelto/amio.   Sleep apnea    Syncope    a.  11/2013 in setting of volume depletion and bradycardia due to dig toxicity    Type II diabetes mellitus ([place])      SURGICAL HISTORY   Past Surgical History:  Procedure Laterality Date  ATRIAL FLUTTER ABLATION N/A 04/16/2014   Procedure: ATRIAL FLUTTER ABLATION;  Surgeon: Evans Lance, MD;  Location: [facility] CATH LAB;  Service: Cardiovascular;  Laterality: N/A;   CARDIAC CATHETERIZATION  10/2013   CARDIAC DEFIBRILLATOR PLACEMENT  04/16/2014   Medtronic Evira device   CARDIAC ELECTROPHYSIOLOGY STUDY AND ABLATION  04/16/2014   atrial flutter ablation    CARDIOVERSION N/A 03/05/2014   Procedure: CARDIOVERSION;  Surgeon: Jolaine Artist, MD;  Location: [place];  Service: Cardiovascular;  Laterality: N/A;   CATARACT EXTRACTION W/PHACO Right 01/04/2017   Procedure: CATARACT EXTRACTION PHACO AND INTRAOCULAR LENS PLACEMENT ([place])  Right Diabetic Complicated;  Surgeon: Leandrew Koyanagi, MD;  Location: [place];  Service: Ophthalmology;  Laterality: Right;  Diabetic   CATARACT EXTRACTION W/PHACO Left 02/08/2017   Procedure: CATARACT EXTRACTION PHACO AND INTRAOCULAR LENS PLACEMENT ([facility]) left diabetic;  Surgeon: Leandrew Koyanagi, MD;  Location: [place];  Service: Ophthalmology;  Laterality: Left;  Diabetic - oral meds sleep apnea   CORONARY ANGIOPLASTY WITH STENT PLACEMENT  2007; 2008 X 2   "1+1 ~ 1"   FOOT SURGERY Left    bone spur   HYDROCELE EXCISION Bilateral    Ileocecal resection and sigmoid enterocolonic fistula repair  09/1998   IMPLANTABLE CARDIOVERTER DEFIBRILLATOR IMPLANT N/A 04/16/2014   Procedure: IMPLANTABLE CARDIOVERTER DEFIBRILLATOR IMPLANT;  Surgeon: Evans Lance, MD;  Location: [facility] CATH LAB;  Service: Cardiovascular;  Laterality: N/A;   LEFT HEART CATHETERIZATION WITH CORONARY ANGIOGRAM N/A 11/22/2013   Procedure: LEFT HEART CATHETERIZATION WITH CORONARY ANGIOGRAM;  Surgeon: Sinclair Grooms, MD;  Location: [facility] CATH LAB;  Service: Cardiovascular;  Laterality: N/A;     FAMILY HISTORY   Family History  Problem Relation Age of Onset   Breast cancer Mother    Heart disease Father    Heart attack Father    Colon cancer Neg Hx      SOCIAL HISTORY   Social History   Tobacco Use   Smoking status: Never Smoker   Smokeless tobacco: Never Used  Substance Use Topics   Alcohol use: No   Drug use: No     MEDICATIONS   Current Medication:  Current Facility-Administered Medications:    0.9 %  sodium chloride infusion, , Intravenous, PRN, Ronny Bacon, MD, Stopped at  06/29/19 1035   acetaminophen (TYLENOL) tablet 650 mg, 650 mg, Oral, Q4H PRN, Ronny Bacon, MD   albuterol (VENTOLIN HFA) 108 (90 Base) MCG/ACT inhaler 1 puff, 1 puff, Inhalation, Q6H PRN, Ronny Bacon, MD   ALPRAZolam Duanne Moron) tablet 0.25 mg, 0.25 mg, Oral, BID PRN, Ronny Bacon, MD   aspirin EC tablet 81 mg, 81 mg, Oral, Daily, Ronny Bacon, MD, 81 mg at 07/02/19 1600   atorvastatin (LIPITOR) tablet 80 mg, 80 mg, Oral, Daily, Ronny Bacon, MD, 80 mg at 07/02/19 1855   Chlorhexidine Gluconate Cloth 2 % PADS 6 each, 6 each, Topical, Q0600, Ronny Bacon, MD, 6 each at 07/03/19 0909   digoxin (LANOXIN) tablet 0.125 mg, 0.125 mg, Oral, Daily, Ronny Bacon, MD, 0.125 mg at 07/02/19 6503   feeding supplement (NEPRO CARB STEADY) liquid 237 mL, 237 mL, Oral, TID BM, Ronny Bacon, MD, 237 mL at 07/02/19 2152   finasteride (PROSCAR) tablet 5 mg, 5 mg, Oral, Daily, Ronny Bacon, MD, 5 mg at 07/02/19 5465   furosemide (LASIX) tablet 60 mg, 60 mg, Oral, BID, Ronny Bacon, MD, 60 mg at 07/02/19 1855   guaiFENesin-dextromethorphan (ROBITUSSIN DM) 100-10 MG/5ML syrup 10 mL, 10 mL, Oral, Q4H  PRN, Ronny Bacon, MD   HYDROcodone-acetaminophen (NORCO/VICODIN) 5-325 MG per tablet 1 tablet, 1 tablet, Oral, Q4H PRN, Ronny Bacon, MD   insulin aspart (novoLOG) injection 0-9 Units, 0-9 Units, Subcutaneous, TID WC, Ronny Bacon, MD, 1 Units at 07/02/19 1745   insulin detemir (LEVEMIR) injection 9 Units, 9 Units, Subcutaneous, Daily, Ronny Bacon, MD, 9 Units at 07/02/19 6468   lactobacillus (FLORANEX/LACTINEX) granules 1 g, 1 g, Oral, TID WC, Ronny Bacon, MD, 1 g at 07/02/19 1700   losartan (COZAAR) tablet 12.5 mg, 12.5 mg, Oral, Daily, Ronny Bacon, MD   metoprolol succinate (TOPROL-XL) 24 hr tablet 25 mg, 25 mg, Oral, Daily, Ronny Bacon, MD, 25 mg at 07/03/19 0321   multivitamin-lutein (OCUVITE-LUTEIN) capsule, , Oral, Daily,  Ronny Bacon, MD, 1 capsule at 07/02/19 2248   niacin (NIASPAN) CR tablet 1,000 mg, 1,000 mg, Oral, Daily, Ronny Bacon, MD, 1,000 mg at 07/02/19 2500   norepinephrine (LEVOPHED) 4 mg in sodium chloride 0.9 % 250 mL (0.016 mg/mL) infusion, 0-40 mcg/min, Intravenous, Titrated, [place], Amy, MD, Last Rate: 18.75 mL/hr at 07/03/19 1600, 5 mcg/min at 07/03/19 1600   ondansetron (ZOFRAN) injection 4 mg, 4 mg, Intravenous, Q6H PRN, Ronny Bacon, MD, 4 mg at 07/03/19 1132   pantoprazole (PROTONIX) EC tablet 40 mg, 40 mg, Oral, Daily, Ronny Bacon, MD, 40 mg at 07/02/19 0928   piperacillin-tazobactam (ZOSYN) IVPB 3.375 g, 3.375 g, Intravenous, Q8H, Ronny Bacon, MD, Last Rate: 12.5 mL/hr at 07/03/19 0518, 3.375 g at 07/03/19 1201   potassium chloride SA (KLOR-CON) CR tablet 20 mEq, 20 mEq, Oral, Daily, Ronny Bacon, MD, 20 mEq at 07/02/19 1605   potassium chloride SA (KLOR-CON) CR tablet 40 mEq, 40 mEq, Oral, Once, Ronny Bacon, MD   sodium chloride flush (NS) 0.9 % injection 10-40 mL, 10-40 mL, Intracatheter, Q12H, Ronny Bacon, MD, 10 mL at 07/03/19 0909   sodium chloride flush (NS) 0.9 % injection 10-40 mL, 10-40 mL, Intracatheter, PRN, Ronny Bacon, MD, 10 mL at 06/30/19 1800   spironolactone (ALDACTONE) tablet 12.5 mg, 12.5 mg, Oral, Daily, Ronny Bacon, MD, 12.5 mg at 07/02/19 3704   tamsulosin (FLOMAX) capsule 0.4 mg, 0.4 mg, Oral, QPC breakfast, Ronny Bacon, MD, 0.4 mg at 07/02/19 8889   vitamin C (ASCORBIC ACID) tablet 500 mg, 500 mg, Oral, BID, Ronny Bacon, MD, 500 mg at 07/02/19 2151   zolpidem (AMBIEN) tablet 5 mg, 5 mg, Oral, QHS PRN, Ronny Bacon, MD    ALLERGIES   Iodine, Shrimp [shellfish allergy], and Tetracycline    REVIEW OF SYSTEMS     Unable to obtain due to poorly responsive state.   PHYSICAL EXAMINATION   Vital Signs: Temp:  [96.8 F (36 C)-98.5 F (36.9 C)] 97.4 F (36.3 C) (12/02 1600) Pulse  Rate:  [54-93] 86 (12/02 1600) Resp:  [0-20] 18 (12/02 1600) BP: (81-132)/(56-105) 86/67 (12/02 1458) SpO2:  [91 %-100 %] 100 % (12/02 1600) FiO2 (%):  [40 %] 40 % (12/02 1410) Weight:  [169 kg] 108 kg (12/02 1043)  GENERAL:NAD HEAD: Normocephalic, atraumatic.  EYES: Pupils equal, round, reactive to light.  No scleral icterus.  MOUTH: Moist mucosal membrane. NECK: Supple. No thyromegaly. No nodules. No JVD.  PULMONARY: CTAB CARDIOVASCULAR: S1 and S2. Regular rate and rhythm. No murmurs, rubs, or gallops.  GASTROINTESTINAL: Soft, nontender, non-distended. No masses. Positive bowel sounds. No hepatosplenomegaly.  MUSCULOSKELETAL: No swelling, clubbing, or edema.  NEUROLOGIC: Mild distress due to acute illness SKIN:intact,warm,dry   PERTINENT DATA     Infusions:  sodium  chloride Stopped (06/29/19 1035)   norepinephrine (LEVOPHED) 25m / 2520minfusion 5 mcg/min (07/03/19 1600)   piperacillin-tazobactam (ZOSYN)  IV 3.375 g (07/03/19 0518)   Scheduled Medications:  aspirin EC  81 mg Oral Daily   atorvastatin  80 mg Oral Daily   Chlorhexidine Gluconate Cloth  6 each Topical Q0600   digoxin  0.125 mg Oral Daily   feeding supplement (NEPRO CARB STEADY)  237 mL Oral TID BM   finasteride  5 mg Oral Daily   furosemide  60 mg Oral BID   insulin aspart  0-9 Units Subcutaneous TID WC   insulin detemir  9 Units Subcutaneous Daily   lactobacillus  1 g Oral TID WC   losartan  12.5 mg Oral Daily   metoprolol succinate  25 mg Oral Daily   multivitamin-lutein   Oral Daily   niacin  1,000 mg Oral Daily   pantoprazole  40 mg Oral Daily   potassium chloride  20 mEq Oral Daily   potassium chloride  40 mEq Oral Once   sodium chloride flush  10-40 mL Intracatheter Q12H   spironolactone  12.5 mg Oral Daily   tamsulosin  0.4 mg Oral QPC breakfast   vitamin C  500 mg Oral BID   PRN Medications: sodium chloride, acetaminophen, albuterol, ALPRAZolam,  guaiFENesin-dextromethorphan, HYDROcodone-acetaminophen, ondansetron (ZOFRAN) IV, sodium chloride flush, zolpidem Hemodynamic parameters:   Intake/Output: 12/01 0701 - 12/02 0700 In: 720 [P.O.:720] Out: 700 [Urine:700]  Ventilator  Settings: Vent Mode: SIMV FiO2 (%):  [40 %] 40 % Set Rate:  [10 bmp] 10 bmp Vt Set:  [500 mL] 500 mL PEEP:  [5 cmH20] 5 cmH20    LAB RESULTS:  Basic Metabolic Panel: Recent Labs  Lab 06/28/19 0534 06/29/19 0516  06/30/19 0523 07/01/19 0450 07/02/19 0457 07/03/19 0514  NA  --  141  --  140 143 143 144  K  --  3.1*   < > 3.6 3.5 3.5 3.4*  CL  --  95*  --  98 99 101 99  CO2  --  31  --  _0 32  GLUCOSE  --  144*  --  140* 125* 113* 109*  BUN  --  17  --  _1 CREATININE  --  0.79  --  0.83 0.70 0.66 0.93  CALCIUM  --  8.0*  --  8.6* 8.9 8.9 8.8*  MG 1.8 1.9  --  1.9  --  2.1  --    < > = values in this interval not displayed.   Liver Function Tests: No results for input(s): AST, ALT, ALKPHOS, BILITOT, PROT, ALBUMIN in the last 168 hours. No results for input(s): LIPASE, AMYLASE in the last 168 hours. No results for input(s): AMMONIA in the last 168 hours. CBC: Recent Labs  Lab 06/29/19 0516 06/30/19 0523 07/01/19 0450 07/02/19 0457 07/03/19 0514  WBC 10.8* 12.3* 11.2* 9.5 9.7  NEUTROABS 8.7* 9.7* 8.8* 7.2 7.2  HGB 9.0* 9.5* 9.4* 9.4* 9.4*  HCT 29.9* 30.8* 30.3* 31.8* 31.8*  MCV 83.3 81.3 81.5 83.9 84.6  PLT 282 280 284 280 262   Cardiac Enzymes: No results for input(s): CKTOTAL, CKMB, CKMBINDEX, TROPONINI in the last 168 hours. BNP: Invalid input(s): POCBNP CBG: Recent Labs  Lab 07/02/19 2109 07/03/19 0838 07/03/19 1036 07/03/19 1314 07/03/19 1726  GLUCAP 184* 98 95 146* 182*     IMAGING RESULTS:    ASSESSMENT AND PLAN    -Multidisciplinary rounds held  today  Post-operative circulatory shock -currently on levophed drip for vasopressor support -related to anesthesia with background hx of OSA and  CHF -patient is poorly arousable but oxygenating well   Chronic systolic CHF with EF 46% S/p PPM/AICD -oxygen as needed -Lasix as tolerated ICU monitoring  Stage 4 decubitus sacral ulcer  - surgery on case - appreciate reccommendations  ID -continue IV abx as prescibed -follow up cultures  GI/Nutrition GI PROPHYLAXIS as indicated DIET-->TF's as tolerated Constipation protocol as indicated  ENDO - ICU hypoglycemic\Hyperglycemia protocol -check FSBS per protocol   ELECTROLYTES -follow labs as needed -replace as needed -pharmacy consultation   DVT/GI PRX ordered -SCDs  TRANSFUSIONS AS NEEDED MONITOR FSBS ASSESS the need for LABS as needed   Critical care provider statement:    Critical care time (minutes):  32   Critical care time was exclusive of:  Separately billable procedures and treating other patients   Critical care was necessary to treat or prevent imminent or life-threatening deterioration of the following conditions:  circulatory shock, respiratory failure, chf, osa, sacral decubitus   Critical care was time spent personally by me on the following activities:  Development of treatment plan with patient or surrogate, discussions with consultants, evaluation of patient's response to treatment, examination of patient, obtaining history from patient or surrogate, ordering and performing treatments and interventions, ordering and review of laboratory studies and re-evaluation of patient's condition.  I assumed direction of critical care for this patient from another provider in my specialty: no    This document was prepared using Dragon voice recognition software and may include unintentional dictation errors.    Ottie Glazier, M.D.  Division of [place]

## 2019-07-03 NOTE — Anesthesia Post-op Follow-up Note (Signed)
Anesthesia QCDR form completed.        

## 2019-07-03 NOTE — Anesthesia Procedure Notes (Signed)
Procedure Name: Intubation Date/Time: 07/03/2019 11:18 AM Performed by: Kelton Pillar, CRNA Pre-anesthesia Checklist: Patient identified, Emergency Drugs available, Suction available and Patient being monitored Patient Re-evaluated:Patient Re-evaluated prior to induction Oxygen Delivery Method: Circle system utilized Preoxygenation: Pre-oxygenation with 100% oxygen Induction Type: IV induction Ventilation: Mask ventilation without difficulty Grade View: Grade III Tube type: Oral Tube size: 7.5 mm Number of attempts: 1 Airway Equipment and Method: Stylet,  Oral airway and Video-laryngoscopy Placement Confirmation: ETT inserted through vocal cords under direct vision,  positive ETCO2 and breath sounds checked- equal and bilateral Secured at: 25 cm Tube secured with: Tape Dental Injury: Teeth and Oropharynx as per pre-operative assessment  Difficulty Due To: Difficulty was anticipated, Difficult Airway- due to large tongue, Difficult Airway- due to reduced neck mobility, Difficult Airway- due to anterior larynx, Difficult Airway-  due to edematous airway and Difficult Airway- due to limited oral opening

## 2019-07-03 NOTE — Progress Notes (Signed)
[place] [facility] [place] NAME: Colton Lamb    MR#:  [id number]  DATE OF BIRTH:  07-Nov-1950  SUBJECTIVE:   Patient just returned from PACU after loop diverticulitis to be. He appears sleepy although answered some questions intermittently. Currently on low-dose IV levophed REVIEW OF SYSTEMS:   Review of Systems  Unable to perform ROS: Medical condition   Tolerating Diet: Tolerating PT:   DRUG ALLERGIES:   Allergies  Allergen Reactions  . Iodine Other (See Comments)    Shortness of breath, swelling and hives  . Shrimp [Shellfish Allergy] Other (See Comments)    SWELLING    HIVES    SHORTNESS OF BREATH  . Tetracycline Rash    VITALS:  Blood pressure (!) 86/67, pulse 89, temperature 97.7 F (36.5 C), resp. rate 12, height 6' (1.829 m), weight 108 kg, SpO2 98 %.  PHYSICAL EXAMINATION:   Physical Exam  GENERAL:  68 y.o.-year-old patient lying in the bed with no acute distress. Chronically ill EYES: Pupils equal, round, reactive to light and accommodation. No scleral icterus. Extraocular muscles intact.  HEENT: Head atraumatic, normocephalic. Oropharynx and nasopharynx clear.  NECK:  Supple, no jugular venous distention. No thyroid enlargement, no tenderness.  LUNGS: Normal breath sounds bilaterally, no wheezing, rales, rhonchi. No use of accessory muscles of respiration.  CARDIOVASCULAR: S1, S2 normal. No murmurs, rubs, or gallops.  ABDOMEN: Soft, nontender, nondistended. Bowel sounds present. No organomegaly or mass. Diverting  Colostomy+ EXTREMITIES: No cyanosis, clubbing or edema b/l.    NEUROLOGIC: Cranial nerves II through XII are intact. No focal Motor or sensory deficits b/l.   PSYCHIATRIC:  patient is alert and oriented x 3.  SKIN: wound VAC at the buttocks  LABORATORY PANEL:  CBC Recent Labs  Lab 07/03/19 0514  WBC 9.7  HGB 9.4*  HCT 31.8*  PLT 262    Chemistries  Recent Labs  Lab 07/02/19 0457 07/03/19 0514   NA 143 144  K 3.5 3.4*  CL 101 99  CO2 30 32  GLUCOSE 113* 109*  BUN 18 19  CREATININE 0.66 0.93  CALCIUM 8.9 8.8*  MG 2.1  --    Cardiac Enzymes No results for input(s): TROPONINI in the last 168 hours. RADIOLOGY:  No results found. ASSESSMENT AND PLAN:  68 year old man PMH systolic CHF, Crohn's disease presented with shortness of breath.  Treated with BiPAP, Lasix and admitted for acute CHF exacerbation.  1. Acute on chronic diastolic/systolic congestive heart failure with severe cardiomyopathy EF of 20 to 25% -appears you will awake. Continue oral Lasix digoxin spironolactone at Toprol-XL -ACE inhibitor/ARB defer to cardiology -cardiology recommends right and left heart catheterization however this is deferred until volume status is optimized and sacral decubitus is healed -DR end/[facility] cardiology following pt  2. Stage IV sacral decubitus present on admission with chronic osteomyelitis -status post wound vac placement of the buttock -diverting colostomy POD #0 to help with wound healing-- by Dr. Christian Mate -on Zosyn -ID following for IV antibiotics  3. Hypotension postop with cardiomyopathy -on low-dose IV pressers -goal blood pressure 90/50  As long as symptoms of hypotension do not develop  4. History of chronic atrial fibrillation -rate controlled -continue digoxin Toprol-XL -resume Xarelto if okay with surgery from tomorrow  5. Diabetes type II, controlled -continue levemir sliding scale insulin -sugars appears stable  6. DVT prophylaxis start Xarelto from tomorrow if okay with surgery   Family communication : Consults : surgery, ID Discharge Disposition :  to be determined CODE STATUS: full   TOTAL TIME TAKING CARE OF THIS PATIENT: *30* minutes.  >50% time spent on counselling and coordination of care  POSSIBLE D/C IN *few* DAYS, DEPENDING ON CLINICAL CONDITION.  Note: This dictation was prepared with Dragon dictation along with smaller phrase  technology. Any transcriptional errors that result from this process are unintentional.  [name] M.D on 07/03/2019 at 3:54 PM  Between 7am to 6pm - Pager - [phone]  After 6pm go to [email or web address]  [facility]   CC: Primary care physician; [name], MDPatient ID: Colton Lamb, male   DOB: September 20, 1950, 68 y.o.   MRN: [id number]

## 2019-07-03 NOTE — OR Nursing (Signed)
Large volume stool around rectal tube, rectal tube leaking/fell out at end of case. Stool compromised wound vac dressing and seal. Per Dr. Christian Mate, rectal tube removed, patient cleaned, sacral wound cleaned and packed with sterile saline soaked kerlix, abd pads on top, and microfoam tape. Patient transferred to PACU.

## 2019-07-03 NOTE — Progress Notes (Signed)
Passed SBT in PACU. Pt extubated to facemask. Continues to require pressor support. Plan to admit to ICU.

## 2019-07-03 NOTE — Progress Notes (Signed)
Progress Note  Patient Name: Colton Lamb Date of Encounter: 07/03/2019  Primary Cardiologist: Rockey Situ  Subjective   No chest pain, SOB, or palpitations. Renal function stable. Potassium 3.4, HGB stable at 9.4.   Inpatient Medications    Scheduled Meds: . aspirin EC  81 mg Oral Daily  . atorvastatin  80 mg Oral Daily  . Chlorhexidine Gluconate Cloth  6 each Topical Q0600  . digoxin  0.125 mg Oral Daily  . feeding supplement (NEPRO CARB STEADY)  237 mL Oral TID BM  . finasteride  5 mg Oral Daily  . furosemide  60 mg Oral BID  . insulin aspart  0-9 Units Subcutaneous TID WC  . insulin detemir  9 Units Subcutaneous Daily  . lactobacillus  1 g Oral TID WC  . metoprolol succinate  25 mg Oral Daily  . multivitamin-lutein   Oral Daily  . niacin  1,000 mg Oral Daily  . pantoprazole  40 mg Oral Daily  . potassium chloride  20 mEq Oral Daily  . potassium chloride  40 mEq Oral Once  . sodium chloride flush  10-40 mL Intracatheter Q12H  . spironolactone  12.5 mg Oral Daily  . tamsulosin  0.4 mg Oral QPC breakfast  . vitamin C  500 mg Oral BID   Continuous Infusions: . sodium chloride Stopped (06/29/19 1035)  . piperacillin-tazobactam (ZOSYN)  IV 3.375 g (07/03/19 0518)   PRN Meds: sodium chloride, acetaminophen, albuterol, ALPRAZolam, guaiFENesin-dextromethorphan, ondansetron (ZOFRAN) IV, sodium chloride flush, zolpidem   Vital Signs    Vitals:   07/02/19 1953 07/03/19 0050 07/03/19 0517 07/03/19 0700  BP: 101/75  124/82 124/71  Pulse: 74 (!) 54 93 86  Resp: 15  17 19   Temp: 98.5 F (36.9 C)  98.2 F (36.8 C) (!) 97.3 F (36.3 C)  TempSrc:    Oral  SpO2: 91% 93% 94% 94%  Weight:      Height:        Intake/Output Summary (Last 24 hours) at 07/03/2019 0841 Last data filed at 07/02/2019 1855 Gross per 24 hour  Intake 720 ml  Output 700 ml  Net 20 ml   Filed Weights   06/29/19 0500 06/29/19 0741  Weight: 55.3 kg 55.3 kg    Telemetry    Not on tele -  Personally Reviewed  ECG    No new tracings - Personally Reviewed  Physical Exam   GEN: No acute distress.   Neck: JVD difficult to assess secondary to patient position. Cardiac: Irregularly irregular, no murmurs, rubs, or gallops.  Respiratory: Coarse breath sounds bilaterally to anterior exam as patient to unable to sit up.  GI: Soft, nontender, non-distended.   MS: Trace pretibial edema bilaterally; No deformity. Neuro:  Alert and oriented x 3; Nonfocal.  Psych: Normal affect.  Labs    Chemistry Recent Labs  Lab 07/01/19 0450 07/02/19 0457 07/03/19 0514  NA 143 143 144  K 3.5 3.5 3.4*  CL 99 101 99  CO2 31 30 32  GLUCOSE 125* 113* 109*  BUN 19 18 19   CREATININE 0.70 0.66 0.93  CALCIUM 8.9 8.9 8.8*  GFRNONAA >60 >60 >60  GFRAA >60 >60 >60  ANIONGAP 13 12 13      Hematology Recent Labs  Lab 07/01/19 0450 07/02/19 0457 07/03/19 0514  WBC 11.2* 9.5 9.7  RBC 3.72* 3.79* 3.76*  HGB 9.4* 9.4* 9.4*  HCT 30.3* 31.8* 31.8*  MCV 81.5 83.9 84.6  MCH 25.3* 24.8* 25.0*  MCHC 31.0  29.6* 29.6*  RDW 18.9* 19.0* 19.2*  PLT 284 280 262    Cardiac EnzymesNo results for input(s): TROPONINI in the last 168 hours. No results for input(s): TROPIPOC in the last 168 hours.   BNPNo results for input(s): BNP, PROBNP in the last 168 hours.   DDimer No results for input(s): DDIMER in the last 168 hours.   Radiology    No results found.  Cardiac Studies   Echo  06/25/2019 1. Left ventricular ejection fraction, by visual estimation, is 20 to 25%. The left ventricle has severely decreased function. There is mildly increased left ventricular hypertrophy. 2. Severely dilated left ventricular internal cavity size. 3. There is global hypokinesis with thinning/akinesis of the anterior and septal walls. 4. Global right ventricle was not well visualized.The right ventricular size is not assessed. Right vetricular wall thickness was not assessed. 5. Left atrial size was not  assessed. 6. Right atrial size was not assessed. 7. Small pericardial effusion. 8. The pericardial effusion is circumferential. 9. Mild mitral annular calcification. 10. The mitral valve was not assessed. No assessment of mitral valve regurgitation. 11. The tricuspid valve is not assessed. Tricuspid valve regurgitation was not assessed. 12. Aortic valve regurgitation was not assessed. 13. The aortic valve was not assessed. Aortic valve regurgitation was not assessed. 14. The pulmonic valve was not well visualized. Pulmonic valve regurgitation was not assessed. 15. Aortic root could not be assessed. 16. The interatrial septum was not assessed.  Echo 02/08/2019 1. The left ventricle has moderately reduced systolic function, with an ejection fraction of 35-40%. The cavity size was normal. Left ventricular diastolic Doppler parameters are indeterminate. Probably severe hypokinesis of mid-distal anterior,  ateroseptal and apical myocardium. 2. The right ventricle has normal systolic function. The cavity was normal. There is no increase in right ventricular wall thickness. 3. Left atrial size was moderately dilated. 4. Very poor image quality in spite of using Definity. Valves not well visualized. Consider different diagnostic testing if indicated. 5. The aortic valve was not well visualized. Aortic valve regurgitation was not assessed by color flow Doppler.  Patient Profile     68 y.o. male with history of coronary artery disease status post prior PCI's to the LAD and LCx/OM, acute on chronic systolic heart failure, persistent atrial fibrillation, and atrial flutter status post ablation.  Assessment & Plan    1. Acute on chronic combined systolic and diastolic CHF: -His volume status is significantly improved from presentation -Continue PO Lasix 60 mg bid, digoxin (level of 0.5 on 11/28), Toprol XL and spironolactone -Check digoxin level in the morning -Add losartan 12.5 mg daily   2. CAD: -No symptoms concerning for angina -Continue ASA 81 mg, can hold if needed for surgery  -Given worsening LVSF and history of multivessel CAD s/p PCI, plan for R/LHC down the road, ideally after GDMT has been optimized and his sacral decubitus ulcer and osteomyelitis have been treated -Add losartan as above -Continue Toprol XL and Lipitor    3. Persistent Afib: -Ventricular rates well controlled  -Continue rate control with digoxin and Toprol XL -Xarelto was stopped after his dose on 11/30 in preparation for possible diverting ostomy as below -Recommend resuming Xarelto when possible or starting heparin gtt if [place] will be held for an extended time and if there is no evidence of bleeding  4. Hypotension: -BP improving  -Midodrine has been stopped -Close monitoring with escalation of GDMT as tolerated   5. Sacral decubitus ulcer and chronic osteomyelitis: -Wound VAC  in place -Followed by ID with recommendation of diverting ostomy to minimize risk for further fecal contamination  -If diverting colostomy is necessary, it is reasonable to proceed from a cardiac standpoint -Given recent acute heart failure with severely reduced LVEF, he is at elevated risk for perioperative cardiovascular complications   -However, he is optimized at this time, and additional interventions are unlikely to further mitigate his perioperative risk  6. Hypokalemia: -Oral repletion to goal of 4.0 has been ordered   For questions or updates, please contact [place] Please consult [email or web address] for contact info under Cardiology/STEMI.    Signed, Christell Faith, PA-C [place] Pager: [phone] 07/03/2019, 8:41 AM

## 2019-07-03 NOTE — Anesthesia Procedure Notes (Signed)
Procedure Name: Intubation Date/Time: 07/03/2019 12:34 PM Performed by: Kelton Pillar, CRNA Pre-anesthesia Checklist: Patient identified, Emergency Drugs available, Suction available and Patient being monitored Patient Re-evaluated:Patient Re-evaluated prior to induction Oxygen Delivery Method: Circle system utilized Preoxygenation: Pre-oxygenation with 100% oxygen Induction Type: IV induction Ventilation: Two handed mask ventilation required Laryngoscope Size: McGraph and 3 Grade View: Grade I Tube type: Oral Tube size: 7.0 mm Number of attempts: 1 Airway Equipment and Method: Stylet Placement Confirmation: ETT inserted through vocal cords under direct vision,  positive ETCO2,  breath sounds checked- equal and bilateral and CO2 detector Secured at: 21 cm Tube secured with: Tape Dental Injury: Teeth and Oropharynx as per pre-operative assessment

## 2019-07-03 NOTE — Anesthesia Postprocedure Evaluation (Signed)
Anesthesia Post Note  Patient: Colton Lamb  Procedure(s) Performed: TRANSVERSE LOOP COLOSTOMY (N/A )  Patient location during evaluation: PACU Anesthesia Type: General Level of consciousness: awake and alert and oriented Pain management: pain level controlled Vital Signs Assessment: post-procedure vital signs reviewed and stable Respiratory status: spontaneous breathing, nonlabored ventilation and respiratory function stable Cardiovascular status: on norepi for BP support. Postop Assessment: no signs of nausea or vomiting Anesthetic complications: yes Anesthetic complication details: required intubation  Reintubated in OR. Extubated after successful SBT in PACU. Requiring norepi infusion for BP support. Plan to admit to ICU.   Last Vitals:  Vitals:   07/03/19 1439 07/03/19 1444  BP: (!) 87/62 96/65  Pulse:    Resp:    Temp:  36.5 C  SpO2:      Last Pain:  Vitals:   07/03/19 1444  TempSrc:   PainSc: 0-No pain                 Laural Eiland

## 2019-07-04 ENCOUNTER — Encounter: Payer: Self-pay | Admitting: Surgery

## 2019-07-04 DIAGNOSIS — Z7189 Other specified counseling: Secondary | ICD-10-CM

## 2019-07-04 DIAGNOSIS — K5080 Crohn's disease of both small and large intestine without complications: Secondary | ICD-10-CM

## 2019-07-04 DIAGNOSIS — I5043 Acute on chronic combined systolic (congestive) and diastolic (congestive) heart failure: Secondary | ICD-10-CM | POA: Diagnosis not present

## 2019-07-04 DIAGNOSIS — Z66 Do not resuscitate: Secondary | ICD-10-CM

## 2019-07-04 DIAGNOSIS — E118 Type 2 diabetes mellitus with unspecified complications: Secondary | ICD-10-CM | POA: Diagnosis not present

## 2019-07-04 DIAGNOSIS — J9601 Acute respiratory failure with hypoxia: Secondary | ICD-10-CM | POA: Diagnosis not present

## 2019-07-04 DIAGNOSIS — Z515 Encounter for palliative care: Secondary | ICD-10-CM

## 2019-07-04 LAB — BASIC METABOLIC PANEL
Anion gap: 10 (ref 5–15)
BUN: 27 mg/dL — ABNORMAL HIGH (ref 8–23)
CO2: 31 mmol/L (ref 22–32)
Calcium: 8.7 mg/dL — ABNORMAL LOW (ref 8.9–10.3)
Chloride: 103 mmol/L (ref 98–111)
Creatinine, Ser: 1.17 mg/dL (ref 0.61–1.24)
GFR calc Af Amer: >60 mL/min (ref >60)
GFR calc non Af Amer: >60 mL/min (ref >60)
Glucose, Bld: 180 mg/dL — ABNORMAL HIGH (ref 70–99)
Potassium: 4.6 mmol/L (ref 3.5–5.1)
Sodium: 144 mmol/L (ref 135–145)

## 2019-07-04 LAB — CBC WITH DIFFERENTIAL/PLATELET
Abs Immature Granulocytes: 0.09 10*3/uL — ABNORMAL HIGH (ref 0.00–0.07)
Basophils Absolute: 0 10*3/uL (ref 0.0–0.1)
Basophils Relative: 0 %
Eosinophils Absolute: 0 10*3/uL (ref 0.0–0.5)
Eosinophils Relative: 0 %
HCT: 33 % — ABNORMAL LOW (ref 39.0–52.0)
Hemoglobin: 9.9 g/dL — ABNORMAL LOW (ref 13.0–17.0)
Immature Granulocytes: 1 %
Lymphocytes Relative: 5 %
Lymphs Abs: 0.6 10*3/uL — ABNORMAL LOW (ref 0.7–4.0)
MCH: 25.3 pg — ABNORMAL LOW (ref 26.0–34.0)
MCHC: 30 g/dL (ref 30.0–36.0)
MCV: 84.2 fL (ref 80.0–100.0)
Monocytes Absolute: 0.5 10*3/uL (ref 0.1–1.0)
Monocytes Relative: 3 %
Neutro Abs: 12.9 10*3/uL — ABNORMAL HIGH (ref 1.7–7.7)
Neutrophils Relative %: 91 %
Platelets: 317 10*3/uL (ref 150–400)
RBC: 3.92 MIL/uL — ABNORMAL LOW (ref 4.22–5.81)
RDW: 18.6 % — ABNORMAL HIGH (ref 11.5–15.5)
WBC: 14.1 10*3/uL — ABNORMAL HIGH (ref 4.0–10.5)
nRBC: 0 % (ref 0.0–0.2)

## 2019-07-04 LAB — GLUCOSE, CAPILLARY
Glucose-Capillary: 158 mg/dL — ABNORMAL HIGH (ref 70–99)
Glucose-Capillary: 141 mg/dL — ABNORMAL HIGH (ref 70–99)
Glucose-Capillary: 122 mg/dL — ABNORMAL HIGH (ref 70–99)
Glucose-Capillary: 127 mg/dL — ABNORMAL HIGH (ref 70–99)

## 2019-07-04 LAB — BLOOD GAS, ARTERIAL
Acid-Base Excess: 4.8 mmol/L — ABNORMAL HIGH (ref 0.0–2.0)
Bicarbonate: 32.4 mmol/L — ABNORMAL HIGH (ref 20.0–28.0)
Delivery systems: POSITIVE
Expiratory PAP: 5
FIO2: 0.28
Inspiratory PAP: 16
O2 Saturation: 96.6 %
Patient temperature: 37
RATE: 8 resp/min
pCO2 arterial: 60 mmHg — ABNORMAL HIGH (ref 32.0–48.0)
pH, Arterial: 7.34 — ABNORMAL LOW (ref 7.350–7.450)
pO2, Arterial: 92 mmHg (ref 83.0–108.0)

## 2019-07-04 LAB — MAGNESIUM: Magnesium: 2.3 mg/dL (ref 1.7–2.4)

## 2019-07-04 LAB — DIGOXIN LEVEL: Digoxin Level: 1.4 ng/mL (ref 0.8–2.0)

## 2019-07-04 NOTE — Progress Notes (Signed)
Progress Note  Patient Name: Colton Lamb Date of Encounter: 07/04/2019  Primary Cardiologist: Rockey Situ  Subjective   Status post diverting colostomy on 38/2 complicated by reintubation postoperatively secondary to increased oxygen requirement and hypoxia now s/p extubation on BiPAP. He was also placed on Levophed with SBP in the 90s to low 100s mmHg this morning. Ventricular rates well controlled. Remains off OAC/heparin post operatively.   Digoxin level 1.4 this morning. Potassium improved from 3.4-->4.6.  Inpatient Medications    Scheduled Meds: . aspirin EC  81 mg Oral Daily  . atorvastatin  80 mg Oral Daily  . Chlorhexidine Gluconate Cloth  6 each Topical Q0600  . digoxin  0.125 mg Oral Daily  . feeding supplement (NEPRO CARB STEADY)  237 mL Oral TID BM  . finasteride  5 mg Oral Daily  . furosemide  60 mg Oral BID  . insulin aspart  0-9 Units Subcutaneous TID WC  . insulin detemir  9 Units Subcutaneous Daily  . lactobacillus  1 g Oral TID WC  . losartan  12.5 mg Oral Daily  . metoprolol succinate  25 mg Oral Daily  . multivitamin-lutein   Oral Daily  . niacin  1,000 mg Oral Daily  . pantoprazole  40 mg Oral Daily  . potassium chloride  20 mEq Oral Daily  . potassium chloride  40 mEq Oral Once  . sodium chloride flush  10-40 mL Intracatheter Q12H  . spironolactone  12.5 mg Oral Daily  . tamsulosin  0.4 mg Oral QPC breakfast  . vitamin C  500 mg Oral BID   Continuous Infusions: . sodium chloride Stopped (06/29/19 1035)  . norepinephrine (LEVOPHED) 43m / 2538minfusion 9 mcg/min (07/04/19 0600)  . piperacillin-tazobactam (ZOSYN)  IV 12.5 mL/hr at 07/04/19 0600   PRN Meds: sodium chloride, acetaminophen, albuterol, ALPRAZolam, guaiFENesin-dextromethorphan, HYDROcodone-acetaminophen, ondansetron (ZOFRAN) IV, sodium chloride flush, zolpidem   Vital Signs    Vitals:   07/04/19 0300 07/04/19 0400 07/04/19 0500 07/04/19 0600  BP: 105/69 113/70 106/74 108/73   Pulse: 74 81 80 66  Resp: 18 15 (!) 5 (!) 23  Temp:  97.7 F (36.5 C)    TempSrc:  Axillary    SpO2: 100% 100% 100% 100%  Weight:      Height:        Intake/Output Summary (Last 24 hours) at 07/04/2019 0739 Last data filed at 07/04/2019 0600 Gross per 24 hour  Intake 1075.55 ml  Output 0 ml  Net 1075.55 ml   Filed Weights   06/29/19 0500 06/29/19 0741 07/03/19 1043  Weight: 55.3 kg 55.3 kg 108 kg    Telemetry    Afib, 70s bpm, occasional PVCs - Personally Reviewed  ECG    No new tracings - Personally Reviewed  Physical Exam   GEN: No acute distress.   Neck: No JVD. Cardiac: Irregularly irregular, no murmurs, rubs, or gallops.  Respiratory: Diminished breath sounds bilaterally on BiPAP.  GI: Soft, nontender, non-distended.   MS: Edema much improved this addmission; No deformity. Neuro:  Alert and oriented x 3; Nonfocal.  Psych: Normal affect.  Labs    Chemistry Recent Labs  Lab 07/02/19 0457 07/03/19 0514 07/04/19 0438  NA 143 144 144  K 3.5 3.4* 4.6  CL 101 99 103  CO2 30 32 31  GLUCOSE 113* 109* 180*  BUN 18 19 27*  CREATININE 0.66 0.93 1.17  CALCIUM 8.9 8.8* 8.7*  GFRNONAA >60 >60 >60  GFRAA >60 >60 >60  ANIONGAP 12 13 10      Hematology Recent Labs  Lab 07/02/19 0457 07/03/19 0514 07/04/19 0438  WBC 9.5 9.7 14.1*  RBC 3.79* 3.76* 3.92*  HGB 9.4* 9.4* 9.9*  HCT 31.8* 31.8* 33.0*  MCV 83.9 84.6 84.2  MCH 24.8* 25.0* 25.3*  MCHC 29.6* 29.6* 30.0  RDW 19.0* 19.2* 18.6*  PLT 280 262 317    Cardiac EnzymesNo results for input(s): TROPONINI in the last 168 hours. No results for input(s): TROPIPOC in the last 168 hours.   BNPNo results for input(s): BNP, PROBNP in the last 168 hours.   DDimer No results for input(s): DDIMER in the last 168 hours.   Radiology    No results found.  Cardiac Studies   Echo  06/25/2019 1. Left ventricular ejection fraction, by visual estimation, is 20 to 25%. The left ventricle has severely decreased  function. There is mildly increased left ventricular hypertrophy. 2. Severely dilated left ventricular internal cavity size. 3. There is global hypokinesis with thinning/akinesis of the anterior and septal walls. 4. Global right ventricle was not well visualized.The right ventricular size is not assessed. Right vetricular wall thickness was not assessed. 5. Left atrial size was not assessed. 6. Right atrial size was not assessed. 7. Small pericardial effusion. 8. The pericardial effusion is circumferential. 9. Mild mitral annular calcification. 10. The mitral valve was not assessed. No assessment of mitral valve regurgitation. 11. The tricuspid valve is not assessed. Tricuspid valve regurgitation was not assessed. 12. Aortic valve regurgitation was not assessed. 13. The aortic valve was not assessed. Aortic valve regurgitation was not assessed. 14. The pulmonic valve was not well visualized. Pulmonic valve regurgitation was not assessed. 15. Aortic root could not be assessed. 16. The interatrial septum was not assessed.  Echo 02/08/2019 1. The left ventricle has moderately reduced systolic function, with an ejection fraction of 35-40%. The cavity size was normal. Left ventricular diastolic Doppler parameters are indeterminate. Probably severe hypokinesis of mid-distal anterior,  ateroseptal and apical myocardium. 2. The right ventricle has normal systolic function. The cavity was normal. There is no increase in right ventricular wall thickness. 3. Left atrial size was moderately dilated. 4. Very poor image quality in spite of using Definity. Valves not well visualized. Consider different diagnostic testing if indicated. 5. The aortic valve was not well visualized. Aortic valve regurgitation was not assessed by color flow Doppler.  Patient Profile     68 y.o. male with history of coronary artery disease status post prior PCI's to the LAD and LCx/OM, acute on chronic systolic  heart failure, persistent atrial fibrillation, and atrial flutter status post ablation.  Assessment & Plan    1. 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 CAD: -No symptoms concerning for angina -Continue ASA 81 mg -Given worsening LVSF and history of multivessel CAD s/p PCI, plan for R/LHC down the road, ideally after GDMT has been optimized and his sacral decubitus ulcer and osteomyelitis have been treated -Continue Lipitor    3. Persistent Afib: -Ventricular rates well controlled  -Digoxin level 1.4 morning of 12/3, hold digoxin this morning, resume at lower dose 0.0625 mg in a day or so -Hold Toprol XL,  losartan, and spironolactone given his is requiring vasopressor support, resume when able -Xarelto was stopped after his dose on 11/30 in preparation for diverting ostomy as below -Recommend resuming Xarelto when possible or starting heparin gtt if [place] will be held for an extended time and if there is no evidence of bleeding, once ok per surgery   4. Hypotension: -Placed on Levophed post operatively, wean as able -Midodrine has been stopped -Close monitoring with escalation of GDMT as tolerated   5. Sacral decubitus ulcer and chronic osteomyelitis: -Wound VAC in place -S/p diverting colostomy  -Per ID, surgery, PCCM, IM  6. Hypokalemia: -Repleted  7. Acute hypoxic respiratory distress: -Status post extubation, remains on BiPAP, wean as able  For questions or updates, please contact [place] Please consult [email or web address] for contact info under Cardiology/STEMI.    Signed, Christell Faith, PA-C [place] Pager: [phone] 07/04/2019, 7:39 AM

## 2019-07-04 NOTE — Progress Notes (Signed)
Patient's son Colton Lamb called and requested to be involved in updates from doctor and support role to patient when making major decisions.  Patient  Verbalized that he wanted his son involved and mentioned his son being his health care power of attorney.

## 2019-07-04 NOTE — Progress Notes (Signed)
OT Cancellation Note  Patient Details Name: Colton Lamb MRN: [id number] DOB: January 23, 1951   Cancelled Treatment:    Reason Eval/Treat Not Completed: Patient not medically ready. Chart reviewed. Pt s/p diverting loop colostomy on 12/2, continue at transfer in place for OT order. Pt noted to be on BiPAP. Currently inappropriate for therapy at this time. Will continue to monitor and re-attempt as appropriate.    Jeni Salles, MPH, MS, OTR/L ascom [phone] 07/04/19, 9:02 AM

## 2019-07-04 NOTE — Progress Notes (Signed)
CRITICAL CARE CONSULTATION NOTE    Name: Colton Lamb MRN: [id number] DOB: 14-Feb-1951     LOS: 62   Referring physician: Dr. Gaylord Shih   SUBJECTIVE FINDINGS & SIGNIFICANT EVENTS   Patient description:   Colton Lamb  is a 68 y.o. obese Caucasian male with a known history of systolic CHF, coronary artery disease hypertension dyslipidemia and Crohn's disease, presented to the emergency room with acute onset of worsening dyspnea with associated dry cough and wheezing. He he admitted to orthopnea and paroxysmal nocturnal dyspnea as well as dyspnea on exertion with lower extremity edema.  No fever or chills.  No nausea or vomiting.  No dysuria, oliguria or hematuria or flank pain.  No recent exposure to COVID-19.  He was tested negative about a week prior to admission.  Upon presentation to the emergency room, his heart rate was 102 and respiratory to 34 with pulse oximetry of 85% on CPAP and later on 100% on BiPAP.  His BNP was 731 and high-sensitivity troponin I of 34.  CBC showed leukocytosis and anemia with neutrophilia and CMP showed a CO2 of 42 with a BUN of 24 and creatinine 0.98 alk phos of 160 with otherwise normal LFTs except for albumin 2.9.  Venous blood gas with a pH 7.38 and bicarbonate of 50.9.  COVID-19 negative  Portable chest ray showed vascular congestion and small pleural effusions with bibasal atelectasis that are new compared to prior exam.  His AICD was in place.  EKG showed atrial fibrillation with a rapid rate of 110 with left intravesicular block and prolonged QT interval with QTC of 568 MS. patient had evaluation by surgery for stage IV sacral decubitus with osteomyelitis.  Patient underwent transverse loop colostomy and was postoperatively brought back to the PACU however required reintubation  due to increased oxygen requirement and hypoxemia.  Subsequently patient was stabilized and was ultimately extubated however was noted to be hypotensive and required vasopressor infusion for blood pressure support, critical care consultation was placed by anesthesia team for further management.  Lines / Drains: PIV x 4  Cultures / Sepsis markers: n/a  Antibiotics: zosyn   Protocols / Consultants: Surgery & hospitalist service  12/3 - placed on BIPAP due to significant hypercapnia, this am improved mentation, repeat ABG reviewed. Wife at bedside reviewed hospital course and answered questions.  BiPAP adjsuted with increased driving pressure currently on 20/5   PAST MEDICAL HISTORY   Past Medical History:  Diagnosis Date   Atrial flutter ([place])    a. s/p Cardioversion 11/22/13, on amiodarone and Xarelto.   CHF (congestive heart failure) (HCC)    Chronic osteomyelitis ([place]) 46/50/3546   Chronic systolic heart failure ([place])    a. 10/2013 EF 20-25%, grade III DD, RV mildly dilated and sys fx mild/mod reduced;  b. 01/2014 Echo: EF 30-35%, gr3 DD, mod dil LA.   Coronary artery disease    a. s/p MI 2007/2015;  b. s/p prior PCI to the LAD/LCX/PDA/PL;  c. 2008: s/p Cypher DES to the OM.   Crohn's ileocolitis ([place])    GERD (gastroesophageal reflux disease)    Hx of adenomatous colonic polyps 11/2003   Hyperlipidemia    Hypertension    Ischemic cardiomyopathy    s. 01/2014 s/p MDT [id number] [name] XT DR single lead AICD.   Obesity    Paroxysmal atrial fibrillation (HCC)    a. CHA2DS2VASc = 4-->xarelto/amio.   Sleep apnea    Syncope    a.  11/2013 in  setting of volume depletion and bradycardia due to dig toxicity    Type II diabetes mellitus ([place])      SURGICAL HISTORY   Past Surgical History:  Procedure Laterality Date   ATRIAL FLUTTER ABLATION N/A 04/16/2014   Procedure: ATRIAL FLUTTER ABLATION;  Surgeon: Evans Lance, MD;  Location: [facility] CATH LAB;  Service:  Cardiovascular;  Laterality: N/A;   CARDIAC CATHETERIZATION  10/2013   CARDIAC DEFIBRILLATOR PLACEMENT  04/16/2014   Medtronic Evira device   CARDIAC ELECTROPHYSIOLOGY STUDY AND ABLATION  04/16/2014   atrial flutter ablation   CARDIOVERSION N/A 03/05/2014   Procedure: CARDIOVERSION;  Surgeon: Jolaine Artist, MD;  Location: [place];  Service: Cardiovascular;  Laterality: N/A;   CATARACT EXTRACTION W/PHACO Right 01/04/2017   Procedure: CATARACT EXTRACTION PHACO AND INTRAOCULAR LENS PLACEMENT ([place])  Right Diabetic Complicated;  Surgeon: Leandrew Koyanagi, MD;  Location: [place];  Service: Ophthalmology;  Laterality: Right;  Diabetic   CATARACT EXTRACTION W/PHACO Left 02/08/2017   Procedure: CATARACT EXTRACTION PHACO AND INTRAOCULAR LENS PLACEMENT ([facility]) left diabetic;  Surgeon: Leandrew Koyanagi, MD;  Location: [place];  Service: Ophthalmology;  Laterality: Left;  Diabetic - oral meds sleep apnea   CORONARY ANGIOPLASTY WITH STENT PLACEMENT  2007; 2008 X 2   "1+1 ~ 1"   FOOT SURGERY Left    bone spur   HYDROCELE EXCISION Bilateral    Ileocecal resection and sigmoid enterocolonic fistula repair  09/1998   IMPLANTABLE CARDIOVERTER DEFIBRILLATOR IMPLANT N/A 04/16/2014   Procedure: IMPLANTABLE CARDIOVERTER DEFIBRILLATOR IMPLANT;  Surgeon: Evans Lance, MD;  Location: [facility] CATH LAB;  Service: Cardiovascular;  Laterality: N/A;   LEFT HEART CATHETERIZATION WITH CORONARY ANGIOGRAM N/A 11/22/2013   Procedure: LEFT HEART CATHETERIZATION WITH CORONARY ANGIOGRAM;  Surgeon: Sinclair Grooms, MD;  Location: [place] CATH LAB;  Service: Cardiovascular;  Laterality: N/A;   TRANSVERSE LOOP COLOSTOMY N/A 07/03/2019   Procedure: TRANSVERSE LOOP COLOSTOMY;  Surgeon: Ronny Bacon, MD;  Location: [facility] ORS;  Service: General;  Laterality: N/A;     FAMILY HISTORY   Family History  Problem Relation Age of Onset   Breast cancer Mother    Heart disease Father    Heart  attack Father    Colon cancer Neg Hx      SOCIAL HISTORY   Social History   Tobacco Use   Smoking status: Never Smoker   Smokeless tobacco: Never Used  Substance Use Topics   Alcohol use: No   Drug use: No     MEDICATIONS   Current Medication:  Current Facility-Administered Medications:    0.9 %  sodium chloride infusion, , Intravenous, PRN, Ronny Bacon, MD, Stopped at 06/29/19 1035   acetaminophen (TYLENOL) tablet 650 mg, 650 mg, Oral, Q4H PRN, Ronny Bacon, MD   albuterol (VENTOLIN HFA) 108 (90 Base) MCG/ACT inhaler 1 puff, 1 puff, Inhalation, Q6H PRN, Ronny Bacon, MD   ALPRAZolam Duanne Moron) tablet 0.25 mg, 0.25 mg, Oral, BID PRN, Ronny Bacon, MD   aspirin EC tablet 81 mg, 81 mg, Oral, Daily, Ronny Bacon, MD, 81 mg at 07/02/19 1600   atorvastatin (LIPITOR) tablet 80 mg, 80 mg, Oral, Daily, Ronny Bacon, MD, 80 mg at 07/02/19 1855   Chlorhexidine Gluconate Cloth 2 % PADS 6 each, 6 each, Topical, Q0600, Ronny Bacon, MD, 6 each at 07/03/19 0909   feeding supplement (NEPRO CARB STEADY) liquid 237 mL, 237 mL, Oral, TID BM, Ronny Bacon, MD, 237 mL at 07/02/19 2152   finasteride (PROSCAR) tablet 5 mg,  5 mg, Oral, Daily, Ronny Bacon, MD, 5 mg at 07/02/19 0865   furosemide (LASIX) tablet 60 mg, 60 mg, Oral, BID, Ronny Bacon, MD, 60 mg at 07/02/19 1855   guaiFENesin-dextromethorphan (ROBITUSSIN DM) 100-10 MG/5ML syrup 10 mL, 10 mL, Oral, Q4H PRN, Ronny Bacon, MD   HYDROcodone-acetaminophen (NORCO/VICODIN) 5-325 MG per tablet 1 tablet, 1 tablet, Oral, Q4H PRN, Ronny Bacon, MD   insulin aspart (novoLOG) injection 0-9 Units, 0-9 Units, Subcutaneous, TID WC, Ronny Bacon, MD, 1 Units at 07/04/19 1250   lactobacillus (FLORANEX/LACTINEX) granules 1 g, 1 g, Oral, TID WC, Ronny Bacon, MD, 1 g at 07/02/19 1700   multivitamin-lutein (OCUVITE-LUTEIN) capsule, , Oral, Daily, Ronny Bacon, MD, 1 capsule at  07/02/19 7846   niacin (NIASPAN) CR tablet 1,000 mg, 1,000 mg, Oral, Daily, Ronny Bacon, MD, 1,000 mg at 07/02/19 9629   norepinephrine (LEVOPHED) 4 mg in sodium chloride 0.9 % 250 mL (0.016 mg/mL) infusion, 0-40 mcg/min, Intravenous, Titrated, [place], Amy, MD, Last Rate: 33.8 mL/hr at 07/04/19 0900, 9 mcg/min at 07/04/19 0900   ondansetron (ZOFRAN) injection 4 mg, 4 mg, Intravenous, Q6H PRN, Ronny Bacon, MD, 4 mg at 07/03/19 1132   pantoprazole (PROTONIX) EC tablet 40 mg, 40 mg, Oral, Daily, Ronny Bacon, MD, 40 mg at 07/02/19 0928   piperacillin-tazobactam (ZOSYN) IVPB 3.375 g, 3.375 g, Intravenous, Q8H, Ronny Bacon, MD, Last Rate: 12.5 mL/hr at 07/04/19 1403, 3.375 g at 07/04/19 1403   potassium chloride SA (KLOR-CON) CR tablet 20 mEq, 20 mEq, Oral, Daily, Ronny Bacon, MD, 20 mEq at 07/02/19 1605   potassium chloride SA (KLOR-CON) CR tablet 40 mEq, 40 mEq, Oral, Once, Ronny Bacon, MD   sodium chloride flush (NS) 0.9 % injection 10-40 mL, 10-40 mL, Intracatheter, Q12H, Ronny Bacon, MD, 10 mL at 07/03/19 2245   sodium chloride flush (NS) 0.9 % injection 10-40 mL, 10-40 mL, Intracatheter, PRN, Ronny Bacon, MD, 10 mL at 06/30/19 1800   tamsulosin (FLOMAX) capsule 0.4 mg, 0.4 mg, Oral, QPC breakfast, Ronny Bacon, MD, 0.4 mg at 07/02/19 5284   vitamin C (ASCORBIC ACID) tablet 500 mg, 500 mg, Oral, BID, Ronny Bacon, MD, 500 mg at 07/02/19 2151   zolpidem (AMBIEN) tablet 5 mg, 5 mg, Oral, QHS PRN, Ronny Bacon, MD    ALLERGIES   Iodine, Shrimp [shellfish allergy], and Tetracycline    REVIEW OF SYSTEMS     Unable to obtain due to poorly responsive state.   PHYSICAL EXAMINATION   Vital Signs: Temp:  [97.4 F (36.3 C)-97.8 F (36.6 C)] 97.6 F (36.4 C) (12/03 0746) Pulse Rate:  [65-94] 69 (12/03 0900) Resp:  [0-23] 14 (12/03 0900) BP: (78-123)/(23-74) 119/55 (12/03 0900) SpO2:  [93 %-100 %] 95 % (12/03 0900) FiO2 (%):   [28 %-40 %] 40 % (12/03 0900)  GENERAL:NAD HEAD: Normocephalic, atraumatic.  EYES: Pupils equal, round, reactive to light.  No scleral icterus.  MOUTH: Moist mucosal membrane. NECK: Supple. No thyromegaly. No nodules. No JVD.  PULMONARY: CTAB CARDIOVASCULAR: S1 and S2. Regular rate and rhythm. No murmurs, rubs, or gallops.  GASTROINTESTINAL: Soft, nontender, non-distended. No masses. Positive bowel sounds. No hepatosplenomegaly.  MUSCULOSKELETAL: No swelling, clubbing, or edema.  NEUROLOGIC: Mild distress due to acute illness SKIN:intact,warm,dry   PERTINENT DATA     Infusions:  sodium chloride Stopped (06/29/19 1035)   norepinephrine (LEVOPHED) 33m / 2512minfusion 9 mcg/min (07/04/19 0900)   piperacillin-tazobactam (ZOSYN)  IV 3.375 g (07/04/19 1403)   Scheduled Medications:  aspirin EC  81 mg Oral  Daily   atorvastatin  80 mg Oral Daily   Chlorhexidine Gluconate Cloth  6 each Topical Q0600   feeding supplement (NEPRO CARB STEADY)  237 mL Oral TID BM   finasteride  5 mg Oral Daily   furosemide  60 mg Oral BID   insulin aspart  0-9 Units Subcutaneous TID WC   lactobacillus  1 g Oral TID WC   multivitamin-lutein   Oral Daily   niacin  1,000 mg Oral Daily   pantoprazole  40 mg Oral Daily   potassium chloride  20 mEq Oral Daily   potassium chloride  40 mEq Oral Once   sodium chloride flush  10-40 mL Intracatheter Q12H   tamsulosin  0.4 mg Oral QPC breakfast   vitamin C  500 mg Oral BID   PRN Medications: sodium chloride, acetaminophen, albuterol, ALPRAZolam, guaiFENesin-dextromethorphan, HYDROcodone-acetaminophen, ondansetron (ZOFRAN) IV, sodium chloride flush, zolpidem Hemodynamic parameters:   Intake/Output: 12/02 0701 - 12/03 0700 In: 1075.6 [I.V.:889.5; IV Piggyback:186.1] Out: 0   Ventilator  Settings: FiO2 (%):  [28 %-40 %] 40 %    LAB RESULTS:  Basic Metabolic Panel: Recent Labs  Lab 06/28/19 0534 06/29/19 0516  06/30/19 0523  07/01/19 0450 07/02/19 0457 07/03/19 0514 07/04/19 0438  NA  --  141  --  140 143 143 144 144  K  --  3.1*   < > 3.6 3.5 3.5 3.4* 4.6  CL  --  95*  --  98 99 101 99 103  CO2  --  31  --  _0 32 31  GLUCOSE  --  144*  --  140* 125* 113* 109* 180*  BUN  --  17  --  _1 27*  CREATININE  --  0.79  --  0.83 0.70 0.66 0.93 1.17  CALCIUM  --  8.0*  --  8.6* 8.9 8.9 8.8* 8.7*  MG 1.8 1.9  --  1.9  --  2.1  --  2.3   < > = values in this interval not displayed.   Liver Function Tests: No results for input(s): AST, ALT, ALKPHOS, BILITOT, PROT, ALBUMIN in the last 168 hours. No results for input(s): LIPASE, AMYLASE in the last 168 hours. No results for input(s): AMMONIA in the last 168 hours. CBC: Recent Labs  Lab 06/30/19 0523 07/01/19 0450 07/02/19 0457 07/03/19 0514 07/04/19 0438  WBC 12.3* 11.2* 9.5 9.7 14.1*  NEUTROABS 9.7* 8.8* 7.2 7.2 12.9*  HGB 9.5* 9.4* 9.4* 9.4* 9.9*  HCT 30.8* 30.3* 31.8* 31.8* 33.0*  MCV 81.3 81.5 83.9 84.6 84.2  PLT 280 284 280 262 317   Cardiac Enzymes: No results for input(s): CKTOTAL, CKMB, CKMBINDEX, TROPONINI in the last 168 hours. BNP: Invalid input(s): POCBNP CBG: Recent Labs  Lab 07/03/19 1314 07/03/19 1726 07/03/19 2156 07/04/19 0751 07/04/19 1151  GLUCAP 146* 182* 157* 158* 141*     IMAGING RESULTS:    ASSESSMENT AND PLAN    -Multidisciplinary rounds held today  Post-operative circulatory shock -currently on levophed drip for vasopressor support -related to anesthesia with background hx of OSA and CHF -patient is poorly arousable but oxygenating well   Hypercapnic respiratory failure -S/p NIV - currenlty on BIPAP  -ABG reviewed today - settings adjusted to increased driving pressure to 94/5   Chronic systolic CHF with EF 03% S/p PPM/AICD -oxygen as needed -Lasix as tolerated ICU monitoring -cardiology on case - appreciate input  Stage 4 decubitus sacral ulcer  - surgery on case -  appreciate  reccommendations  ID -continue IV abx as prescibed -follow up cultures  GI/Nutrition GI PROPHYLAXIS as indicated DIET-->TF's as tolerated Constipation protocol as indicated  ENDO - ICU hypoglycemic\Hyperglycemia protocol -check FSBS per protocol   ELECTROLYTES -follow labs as needed -replace as needed -pharmacy consultation   DVT/GI PRX ordered -SCDs  TRANSFUSIONS AS NEEDED MONITOR FSBS ASSESS the need for LABS as needed   Critical care provider statement:    Critical care time (minutes):  32   Critical care time was exclusive of:  Separately billable procedures and treating other patients   Critical care was necessary to treat or prevent imminent or life-threatening deterioration of the following conditions:  circulatory shock, respiratory failure, chf, osa, sacral decubitus   Critical care was time spent personally by me on the following activities:  Development of treatment plan with patient or surrogate, discussions with consultants, evaluation of patient's response to treatment, examination of patient, obtaining history from patient or surrogate, ordering and performing treatments and interventions, ordering and review of laboratory studies and re-evaluation of patient's condition.  I assumed direction of critical care for this patient from another provider in my specialty: no    This document was prepared using Dragon voice recognition software and may include unintentional dictation errors.    Ottie Glazier, M.D.  Division of [place]

## 2019-07-04 NOTE — Progress Notes (Addendum)
[facility] Hospital Day(s): 22.   Post op day(s): 1 Day Post-Op.   Interval History:  POD1 - diverting loop colostomy Seen and examined He was able to be extubated in PACU yesterday, transferred to ICU for BP support.  Patient reports "he is doing pretty good" Denied any abdominal pain BP improving (102/61), he is requiring 9 mcg/min Levophed this morning Leukocytosis 14K (may be stress reaction from surgery), no fevers, on IV Abx for chronic osteomyelitis He is on BiPAP  Vital signs in last 24 hours: [min-max] current  Temp:  [96.8 F (36 C)-97.8 F (36.6 C)] 97.6 F (36.4 C) (12/03 0746) Pulse Rate:  [65-94] 66 (12/03 0600) Resp:  [0-23] 23 (12/03 0600) BP: (78-132)/(23-105) 108/73 (12/03 0600) SpO2:  [93 %-100 %] 100 % (12/03 0600) FiO2 (%):  [28 %-40 %] 40 % (12/03 0400) Weight:  [166 kg] 108 kg (12/02 1043)     Height: 6' (182.9 cm) Weight: 108 kg BMI (Calculated): 32.27   Intake/Output last 2 shifts:  12/02 0701 - 12/03 0700 In: 1075.6 [I.V.:889.5; IV Piggyback:186.1] Out: 0    Physical Exam:  Constitutional: alert, cooperative and no distress  Respiratory: breathing non-labored at rest, on BPAP  Cardiovascular: regular rate and sinus rhythm  Gastrointestinal: soft, non-tender, and non-distended, loop colostomy in LLQ was red rubber in place, bowel sweat in bag Integumentary: warm, dry, previous laparotomy incision well healed   Labs:  CBC Latest Ref Rng & Units 07/04/2019 07/03/2019 07/02/2019  WBC 4.0 - 10.5 K/uL 14.1(H) 9.7 9.5  Hemoglobin 13.0 - 17.0 g/dL 9.9(L) 9.4(L) 9.4(L)  Hematocrit 39.0 - 52.0 % 33.0(L) 31.8(L) 31.8(L)  Platelets 150 - 400 K/uL 317 262 280   CMP Latest Ref Rng & Units 07/04/2019 07/03/2019 07/02/2019  Glucose 70 - 99 mg/dL 180(H) 109(H) 113(H)  BUN 8 - 23 mg/dL 27(H) 19 18  Creatinine 0.61 - 1.24 mg/dL 1.17 0.93 0.66  Sodium 135 - 145 mmol/L 144 144 143  Potassium 3.5 - 5.1 mmol/L 4.6 3.4(L) 3.5   Chloride 98 - 111 mmol/L 103 99 101  CO2 22 - 32 mmol/L 31 32 30  Calcium 8.9 - 10.3 mg/dL 8.7(L) 8.8(L) 8.9  Total Protein 6.5 - 8.1 g/dL - - -  Total Bilirubin 0.3 - 1.2 mg/dL - - -  Alkaline Phos 38 - 126 U/L - - -  AST 15 - 41 U/L - - -  ALT 0 - 44 U/L - - -    Imaging studies: No new pertinent imaging studies   Assessment/Plan:  68 y.o. male now in ICU requiring vasopressor support 1 Day Post-Op s/p diverting loop sigmoid colostomy for stool diversion away from stage VI sacral decubitus ulceration with chronic osteomyelitis, complicated by multiple pertinent comorbidities.   - Dr Christian Mate will mature ostomy at bedside when ready --> this will likely occur on 12/07  - Remain NPO for now; he is okay for diet once he is hungry  - Continue current wound management plan; wound vac; appreciate WOC RN assistance   - Continue IV ABx; ID Following; appreciate recommendations  - Supportive care per [facility] team & Medicine service  All of the above findings and recommendations were discussed with the patient, and the medical team, and all of patient's questions were answered to his expressed satisfaction.  -- Edison Simon, PA-C [facility] 07/04/2019, 7:48 AM [phone] M-F: 7am - 4pm

## 2019-07-04 NOTE — Progress Notes (Signed)
Ch received an update from the respiratory team regarding the pt. Pt was recently transferred to ICU from 1A. Pt is able to respond but not appropriately. Ch checked on the SIL of the pt that was concerned that the pt was declining. F/u support encouraged.    07/04/19 1300  Clinical Encounter Type  Visited With Health care provider;Patient and family together  Visit Type Spiritual support;Social support;Critical Care  Spiritual Encounters  Spiritual Needs Emotional;Grief support  Stress Factors  Patient Stress Factors Health changes;Loss of control;Major life changes  Family Stress Factors Major life changes

## 2019-07-04 NOTE — Progress Notes (Signed)
[facility] [place] NAME: Colton Lamb    MR#:  [id number]  DATE OF BIRTH:  03/22/1951  SUBJECTIVE:  patient more awake. He is currently on BiPAP. Placed on it last night with increasing shortness of breath. Oxygen saturation hundred percent. Currently on low-dose IV levophed  Patient's sister-in-law is in the room. REVIEW OF SYSTEMS:   Review of Systems  Unable to perform ROS: Medical condition    DRUG ALLERGIES:   Allergies  Allergen Reactions  . Iodine Other (See Comments)    Shortness of breath, swelling and hives  . Shrimp [Shellfish Allergy] Other (See Comments)    SWELLING    HIVES    SHORTNESS OF BREATH  . Tetracycline Rash    VITALS:  Blood pressure 96/67, pulse 72, temperature 97.6 F (36.4 C), temperature source Axillary, resp. rate 12, height 6' (1.829 m), weight 108 kg, SpO2 100 %.  PHYSICAL EXAMINATION:   Physical Exam  GENERAL:  68 y.o.-year-old patient lying in the bed with mild acute distress. Chronically ill and on BiPAP  EYES: Pupils equal, round, reactive to light and accommodation. No scleral icterus. Extraocular muscles intact.  HEENT: Head atraumatic, normocephalic. Oropharynx and nasopharynx clear.  NECK:  Supple, no jugular venous distention. No thyroid enlargement, no tenderness.  LUNGS: Normal breath sounds bilaterally, no wheezing, rales, rhonchi. No use of accessory muscles of respiration.  CARDIOVASCULAR: S1, S2 normal. No murmurs, rubs, or gallops.  ABDOMEN: Soft, nontender, nondistended. Bowel sounds present. No organomegaly or mass. Diverting  Colostomy+ EXTREMITIES: No cyanosis, clubbing or edema b/l.    NEUROLOGIC: Cranial nerves II through XII are intact. No focal Motor or sensory deficits b/l.   PSYCHIATRIC:  patient is alert and oriented x 3.  SKIN: wound VAC at the buttocks  LABORATORY PANEL:  CBC Recent Labs  Lab 07/04/19 0438  WBC 14.1*  HGB 9.9*  HCT 33.0*  PLT 317     Chemistries  Recent Labs  Lab 07/04/19 0438  NA 144  K 4.6  CL 103  CO2 31  GLUCOSE 180*  BUN 27*  CREATININE 1.17  CALCIUM 8.7*  MG 2.3   Cardiac Enzymes No results for input(s): TROPONINI in the last 168 hours. RADIOLOGY:  No results found. ASSESSMENT AND PLAN:  68 year old man PMH systolic CHF, Crohn's disease presented with shortness of breath.  Treated with BiPAP, Lasix and admitted for acute CHF exacerbation.  1. Acute on chronic diastolic/systolic congestive heart failure with severe cardiomyopathy EF of 20 to 25% -appears you will awake. Continue oral Lasix digoxin spironolactone at Toprol-XL -ACE inhibitor/ARB defer to cardiology -cardiology recommends right and left heart catheterization however this is deferred until volume status is optimized and sacral decubitus is healed -DR end/[facility] cardiology following pt -patient currently on BiPAP  2. Stage IV sacral decubitus present on admission with chronic osteomyelitis -status post wound vac placement of the buttock -diverting colostomy POD #1 to help with wound healing-- by Dr. Christian Mate -on Zosyn -ID following for IV antibiotics -Foley catheter to be placed to avoid soiling of her decubitus. Patient had Foley before as well. Condom catheter hard to place per staff  3. Hypotension postop with cardiomyopathy -on low-dose IV pressers -goal blood pressure 90/50  As long as symptoms of hypotension do not develop  4. History of chronic atrial fibrillation -rate controlled -continue digoxin Toprol-XL -resume Xarelto if okay with surgery  5. Diabetes type II, controlled -continue levemir sliding scale insulin -sugars appears  stable  6. DVT prophylaxis start Xarelto if okay from surgery standpoint.  7. Code status-- DNR. Patient and family met with palliative care and goals of care were discussed.   Family communication : sister-in-law in the room Consults : surgery, ID Discharge Disposition : to be  determined CODE STATUS: DNR   TOTAL TIME TAKING CARE OF THIS PATIENT: *30* minutes.  >50% time spent on counselling and coordination of care  POSSIBLE D/C IN *few* DAYS, DEPENDING ON CLINICAL CONDITION.  Note: This dictation was prepared with Dragon dictation along with smaller phrase technology. Any transcriptional errors that result from this process are unintentional.  [name] M.D on 07/04/2019 at 4:03 PM  Between 7am to 6pm - Pager - [phone]  After 6pm go to [email or web address]  [facility]   CC: Primary care physician; [name], MDPatient ID: Colton Lamb, male   DOB: 05-31-1951, 68 y.o.   MRN: [id number]

## 2019-07-04 NOTE — Progress Notes (Signed)
Patient incontinent and urine is getting on sacral wound and wound vac.  Dr. Posey Pronto notified and order obtained to place foley catheter.

## 2019-07-04 NOTE — Consult Note (Signed)
Consultation Note Date: 07/04/2019   Patient Name: Colton Lamb  DOB: 12-23-1950  MRN: [id number]  Age / Sex: 68 y.o., male   PCP: [name], MD Referring Physician: [name], MD   REASON FOR CONSULTATION:Establishing goals of care  Palliative Care consult requested for goals of care in this 68 y.o. male with multiple medical problems including sacral decubitus stage IV with osteomyelitis, systolic CHF(EF 54-27%), AICD, coronary artery disease, hypertension dyslipidemia, GERD, atrial fibrillation (xarelto), sleep apnea, diabetes, and Crohn's disease. He presented to the ED with complaints of worsening dyspnea, dry cough, and wheezing. During ED work-up oxygen saturations 85% on CPAP which later was up to 100% on BiPAP, BNP 731, troponin 34, BUN 24, creatinine 0.98, albumin 2.9. COVID-19 negative. Chest x-ray showed vascular congestion and small pleural effusions with bibasal atelectasis that are new compared to prior exam. Since admission patient was seen by surgery and underwent diverting loop colostomy. He required reintubation due to increased oxygen requirement s/p surgery and later was able to extubate. He is now requiring BiPAP and as noted post-surgery continues to require vasopressors for blood pressure support.   Clinical Assessment and Goals of Care: I have reviewed medical records including lab results, imaging, Epic notes, and MAR, received report from the bedside RN, and assessed the patient. I met at the bedside with patient to discuss diagnosis prognosis, GOC, EOL wishes, disposition and options. Colton Lamb is asleep in bed but will arouse to verbal stimuli. He is alert and oriented x4. Will follow commands. He is on BiPAP and continues on pressors for blood pressure support. He denies pain and states he is breathing much better on BiPAP. No family at the bedside. I offered to involve his son in conversation however he declined, but did ask to speak with his sister  [name] and provide updates after our discussion.   I introduced Palliative Medicine as specialized medical care for people living with serious illness. It focuses on providing relief from the symptoms and stress of a serious illness. The goal is to improve quality of life for both the patient and the family. Colton Lamb is familiar to the Palliative team as my colleague Crystal, NP was involved in his care on previous admissions.   Patient was a resident at [facility] facility. He states "I hope I don't go back there if I make it out of here!"   We discussed His current illness and what it means in the larger context of His on-going co-morbidities. With specific discussions regarding his respiratory failure requiring BiPAP and stage 4 ulcer.   I attempted to elicit values and goals of care important to the patient.    I discussed in detail with patient his full code status and concerns for high risk of decline requiring CPR and re-intubation. Colton Lamb verbalized understanding expressing his wishes not to be intubated again. I discussed and educated patient based on his verbalized wishes in the event his health continued to decline requiring need for intubation and or CPR we would not perform these heroic measures as requested with his awareness his care would then require focusing on comfort for end-of-life with expected death. He verbalized understanding. He states "no intubation or cpr, but I still want to be cared for until that time comes!"  I again confirmed with patient his request. Colton Lamb verbalized he did not want CPR or to be placed back the ventilator in the event he declined. [name], RT was at the bedside  and also witnessed patient awake, alertness, and his verbalized wishes for no heroic measures. I educated patient that I would change his status to DNR/DNI as requested. He verbalized "ok"!   Mr. Staffieri reports he does not have an advanced directive, however, he expressed his sister, [name] is his contact person and who he would rely to make further decisions for him if needed. I acknowledge that his son was also listed and would be considered his next of kin when emergent decisions were needed. He verbalized his awareness but again states, '"I am telling you I want my sister [name] to be called with any concerns or medical decisions, not my son!"   Patient states he is tired and wants to rest. He again request I call his sister [name] and provide updates. Support given.   SOCIAL HISTORY:     reports that he has never smoked. He has never used smokeless tobacco. He reports that he does not drink alcohol or use drugs.  CODE STATUS: DNR  ADVANCE DIRECTIVES: [name] (sister)    SYMPTOM MANAGEMENT: per attending.   Palliative Prophylaxis:   Aspiration, Bowel Regimen, Delirium Protocol, Eye Care, Frequent Pain Assessment, Oral Care, Palliative Wound Care and Turn Reposition  PSYCHO-SOCIAL/SPIRITUAL:  Support System: Family   Desire for further Chaplaincy support: Yes  Additional Recommendations (Limitations, Scope, Preferences):  Full Scope Treatment and DNR/DNI   PAST MEDICAL HISTORY: Past Medical History:  Diagnosis Date   Atrial flutter ([place])    a. s/p Cardioversion 11/22/13, on amiodarone and Xarelto.   CHF (congestive heart failure) (HCC)    Chronic osteomyelitis ([place]) 76/16/0737   Chronic systolic heart failure ([place])    a. 10/2013 EF 20-25%, grade III DD, RV mildly dilated and sys fx mild/mod reduced;  b. 01/2014 Echo: EF 30-35%, gr3 DD, mod dil LA.   Coronary artery disease    a. s/p MI 2007/2015;  b. s/p prior PCI to the LAD/LCX/PDA/PL;  c. 2008: s/p Cypher DES to the OM.   Crohn's ileocolitis ([place])    GERD (gastroesophageal reflux disease)    Hx of adenomatous colonic polyps 11/2003   Hyperlipidemia    Hypertension    Ischemic cardiomyopathy    s. 01/2014 s/p MDT [id number] [name] XT DR single lead AICD.   Obesity    Paroxysmal atrial  fibrillation (HCC)    a. CHA2DS2VASc = 4-->xarelto/amio.   Sleep apnea    Syncope    a.  11/2013 in setting of volume depletion and bradycardia due to dig toxicity    Type II diabetes mellitus ([place])     PAST SURGICAL HISTORY:  Past Surgical History:  Procedure Laterality Date   ATRIAL FLUTTER ABLATION N/A 04/16/2014   Procedure: ATRIAL FLUTTER ABLATION;  Surgeon: Evans Lance, MD;  Location: [facility] CATH LAB;  Service: Cardiovascular;  Laterality: N/A;   CARDIAC CATHETERIZATION  10/2013   CARDIAC DEFIBRILLATOR PLACEMENT  04/16/2014   Medtronic Evira device   CARDIAC ELECTROPHYSIOLOGY STUDY AND ABLATION  04/16/2014   atrial flutter ablation   CARDIOVERSION N/A 03/05/2014   Procedure: CARDIOVERSION;  Surgeon: Jolaine Artist, MD;  Location: [place];  Service: Cardiovascular;  Laterality: N/A;   CATARACT EXTRACTION W/PHACO Right 01/04/2017   Procedure: CATARACT EXTRACTION PHACO AND INTRAOCULAR LENS PLACEMENT ([place])  Right Diabetic Complicated;  Surgeon: Leandrew Koyanagi, MD;  Location: [place];  Service: Ophthalmology;  Laterality: Right;  Diabetic   CATARACT EXTRACTION W/PHACO Left 02/08/2017   Procedure: CATARACT EXTRACTION PHACO AND INTRAOCULAR LENS  PLACEMENT ([place]) left diabetic;  Surgeon: Leandrew Koyanagi, MD;  Location: [place];  Service: Ophthalmology;  Laterality: Left;  Diabetic - oral meds sleep apnea   CORONARY ANGIOPLASTY WITH STENT PLACEMENT  2007; 2008 X 2   "1+1 ~ 1"   FOOT SURGERY Left    bone spur   HYDROCELE EXCISION Bilateral    Ileocecal resection and sigmoid enterocolonic fistula repair  09/1998   IMPLANTABLE CARDIOVERTER DEFIBRILLATOR IMPLANT N/A 04/16/2014   Procedure: IMPLANTABLE CARDIOVERTER DEFIBRILLATOR IMPLANT;  Surgeon: Evans Lance, MD;  Location: [facility] CATH LAB;  Service: Cardiovascular;  Laterality: N/A;   LEFT HEART CATHETERIZATION WITH CORONARY ANGIOGRAM N/A 11/22/2013   Procedure: LEFT HEART CATHETERIZATION WITH  CORONARY ANGIOGRAM;  Surgeon: Sinclair Grooms, MD;  Location: [facility] CATH LAB;  Service: Cardiovascular;  Laterality: N/A;   TRANSVERSE LOOP COLOSTOMY N/A 07/03/2019   Procedure: TRANSVERSE LOOP COLOSTOMY;  Surgeon: Ronny Bacon, MD;  Location: [facility] ORS;  Service: General;  Laterality: N/A;    ALLERGIES:  is allergic to iodine; shrimp [shellfish allergy]; and tetracycline.   MEDICATIONS:  Current Facility-Administered Medications  Medication Dose Route Frequency Provider Last Rate Last Dose   0.9 %  sodium chloride infusion   Intravenous PRN Ronny Bacon, MD   Stopped at 06/29/19 1035   acetaminophen (TYLENOL) tablet 650 mg  650 mg Oral Q4H PRN Ronny Bacon, MD       albuterol (VENTOLIN HFA) 108 (90 Base) MCG/ACT inhaler 1 puff  1 puff Inhalation Q6H PRN Ronny Bacon, MD       ALPRAZolam Duanne Moron) tablet 0.25 mg  0.25 mg Oral BID PRN Ronny Bacon, MD       aspirin EC tablet 81 mg  81 mg Oral Daily Ronny Bacon, MD   81 mg at 07/02/19 1600   atorvastatin (LIPITOR) tablet 80 mg  80 mg Oral Daily Ronny Bacon, MD   80 mg at 07/02/19 1855   Chlorhexidine Gluconate Cloth 2 % PADS 6 each  6 each Topical Q0600 Ronny Bacon, MD   6 each at 07/03/19 0909   feeding supplement (NEPRO CARB STEADY) liquid 237 mL  237 mL Oral TID BM Ronny Bacon, MD   237 mL at 07/02/19 2152   finasteride (PROSCAR) tablet 5 mg  5 mg Oral Daily Ronny Bacon, MD   5 mg at 07/02/19 0254   furosemide (LASIX) tablet 60 mg  60 mg Oral BID Ronny Bacon, MD   60 mg at 07/02/19 1855   guaiFENesin-dextromethorphan (ROBITUSSIN DM) 100-10 MG/5ML syrup 10 mL  10 mL Oral Q4H PRN Ronny Bacon, MD       HYDROcodone-acetaminophen (NORCO/VICODIN) 5-325 MG per tablet 1 tablet  1 tablet Oral Q4H PRN Ronny Bacon, MD       insulin aspart (novoLOG) injection 0-9 Units  0-9 Units Subcutaneous TID WC Ronny Bacon, MD   2 Units at 07/04/19 0816   insulin detemir (LEVEMIR) injection  9 Units  9 Units Subcutaneous Daily Ronny Bacon, MD   9 Units at 07/02/19 2706   lactobacillus (FLORANEX/LACTINEX) granules 1 g  1 g Oral TID WC Ronny Bacon, MD   1 g at 07/02/19 1700   multivitamin-lutein (OCUVITE-LUTEIN) capsule   Oral Daily Ronny Bacon, MD   1 capsule at 07/02/19 2376   niacin (NIASPAN) CR tablet 1,000 mg  1,000 mg Oral Daily Ronny Bacon, MD   1,000 mg at 07/02/19 2831   norepinephrine (LEVOPHED) 4 mg in sodium chloride 0.9 % 250 mL (0.016 mg/mL) infusion  0-40 mcg/min Intravenous Titrated Penwarden, Amy, MD 33.8 mL/hr at 07/04/19 0900 9 mcg/min at 07/04/19 0900   ondansetron (ZOFRAN) injection 4 mg  4 mg Intravenous Q6H PRN Ronny Bacon, MD   4 mg at 07/03/19 1132   pantoprazole (PROTONIX) EC tablet 40 mg  40 mg Oral Daily Ronny Bacon, MD   40 mg at 07/02/19 0928   piperacillin-tazobactam (ZOSYN) IVPB 3.375 g  3.375 g Intravenous Q8H Ronny Bacon, MD 12.5 mL/hr at 07/04/19 0900     potassium chloride SA (KLOR-CON) CR tablet 20 mEq  20 mEq Oral Daily Ronny Bacon, MD   20 mEq at 07/02/19 1605   potassium chloride SA (KLOR-CON) CR tablet 40 mEq  40 mEq Oral Once Ronny Bacon, MD       sodium chloride flush (NS) 0.9 % injection 10-40 mL  10-40 mL Intracatheter Q12H Ronny Bacon, MD   10 mL at 07/03/19 2245   sodium chloride flush (NS) 0.9 % injection 10-40 mL  10-40 mL Intracatheter PRN Ronny Bacon, MD   10 mL at 06/30/19 1800   tamsulosin (FLOMAX) capsule 0.4 mg  0.4 mg Oral QPC breakfast Ronny Bacon, MD   0.4 mg at 07/02/19 9390   vitamin C (ASCORBIC ACID) tablet 500 mg  500 mg Oral BID Ronny Bacon, MD   500 mg at 07/02/19 2151   zolpidem (AMBIEN) tablet 5 mg  5 mg Oral QHS PRN Ronny Bacon, MD        VITAL SIGNS: BP (!) 119/55    Pulse 69    Temp 97.6 F (36.4 C) (Axillary)    Resp 14    Ht 6' (1.829 m)    Wt 108 kg    SpO2 95%    BMI 32.28 kg/m  Filed Weights   06/29/19 0500 06/29/19 0741  07/03/19 1043  Weight: 55.3 kg 55.3 kg 108 kg    Estimated body mass index is 32.28 kg/m as calculated from the following:   Height as of this encounter: 6' (1.829 m).   Weight as of this encounter: 108 kg.  LABS: CBC:    Component Value Date/Time   WBC 14.1 (H) 07/04/2019 0438   HGB 9.9 (L) 07/04/2019 0438   HGB 13.9 11/22/2013 0301   HCT 33.0 (L) 07/04/2019 0438   HCT 43.3 11/22/2013 0301   PLT 317 07/04/2019 0438   PLT 350 11/22/2013 0301   Comprehensive Metabolic Panel:    Component Value Date/Time   NA 144 07/04/2019 0438   NA 141 05/09/2016 0830   NA 133 (L) 11/22/2013 0301   K 4.6 07/04/2019 0438   K 3.7 11/22/2013 0301   CO2 31 07/04/2019 0438   CO2 22 11/22/2013 0301   BUN 27 (H) 07/04/2019 0438   BUN 14 05/09/2016 0830   BUN 16 11/22/2013 0301   CREATININE 1.17 07/04/2019 0438   CREATININE 1.28 11/22/2013 0301   ALBUMIN 2.9 (L) 06/12/2019 0100   ALBUMIN 4.3 04/18/2018 0926     Review of Systems  Respiratory: Positive for shortness of breath.   Skin: Positive for wound.  Unless otherwise noted, a complete review of systems is negative.  Physical Exam General: NAD, frail chronically-ill appearing, Cardiovascular: regular rate and rhythm Pulmonary: diminished, on BiPAP Abdomen: soft, nontender, + bowel sounds. colostomy Neurological: alert, cooperative, will follow commands   Prognosis: Poor in the setting of post-op circulatory shock, respiratory failure requiring BiPAP, sCHF, EF 20-25%, AICD, stage 4 sacral decub (non-healing), s/p diverting colostomy, hypotension, diabetes, cardiomyopathy  Discharge Planning:  To Be Determined  Recommendations:  DNR/DNI-as requested by patient  Continue with current plan of care per medical team  Patient requesting continued care. States his sister [name] is his point-of-contact and decision maker if needed. He requested for me to call and update her. Attempted to call while at the bedside and again later at the  desk. Voicemail left.   PMT will continue to support and follow.    Palliative Performance Scale: PPS 20-30%              Patient expressed understanding and was in agreement with this plan.   Thank you for allowing the Palliative Medicine Team to assist in the care of this patient.  Time In: 1110 Time Out: 1200 Time Total: 50 min.   Visit consisted of counseling and education dealing with the complex and emotionally intense issues of symptom management and palliative care in the setting of serious and potentially life-threatening illness.Greater than 50%  of this time was spent counseling and coordinating care related to the above assessment and plan.  Signed by:  Alda Lea, AGPCNP-BC Palliative Medicine Team  Phone: [phone] Fax: [phone] Pager: [phone] Amion: Bjorn Pippin

## 2019-07-04 NOTE — Progress Notes (Signed)
Pharmacy Electrolyte Monitoring Consult:  Pharmacy consulted to assist in monitoring and replacing electrolytes in this 68 y.o. male admitted on 06/12/2019 with Respiratory Distress  Labs:  Sodium (mmol/L)  Date Value  07/04/2019 144  05/09/2016 141  11/22/2013 133 (L)   Potassium (mmol/L)  Date Value  07/04/2019 4.6  11/22/2013 3.7   Magnesium (mg/dL)  Date Value  07/04/2019 2.3  11/22/2013 1.8   Phosphorus (mg/dL)  Date Value  06/22/2019 3.7   Calcium (mg/dL)  Date Value  07/04/2019 8.7 (L)   Calcium, Total (mg/dL)  Date Value  11/22/2013 9.0   Albumin (g/dL)  Date Value  06/12/2019 2.9 (L)  04/18/2018 4.3    Assessment/Plan: No replacement warranted.   BMP in am.   Replace for goal potassium ~ 4 and goal magnesium ~2.   Pharmacy will continue to monitor and adjust per consult   Ramina Hulet L 07/04/2019 4:40 PM

## 2019-07-05 DIAGNOSIS — J9601 Acute respiratory failure with hypoxia: Secondary | ICD-10-CM | POA: Diagnosis not present

## 2019-07-05 DIAGNOSIS — R339 Retention of urine, unspecified: Secondary | ICD-10-CM

## 2019-07-05 DIAGNOSIS — E118 Type 2 diabetes mellitus with unspecified complications: Secondary | ICD-10-CM | POA: Diagnosis not present

## 2019-07-05 DIAGNOSIS — M8660 Other chronic osteomyelitis, unspecified site: Secondary | ICD-10-CM | POA: Diagnosis not present

## 2019-07-05 DIAGNOSIS — R570 Cardiogenic shock: Secondary | ICD-10-CM

## 2019-07-05 DIAGNOSIS — I5043 Acute on chronic combined systolic (congestive) and diastolic (congestive) heart failure: Secondary | ICD-10-CM | POA: Diagnosis not present

## 2019-07-05 DIAGNOSIS — Z978 Presence of other specified devices: Secondary | ICD-10-CM

## 2019-07-05 LAB — BASIC METABOLIC PANEL
Anion gap: 7 (ref 5–15)
BUN: 30 mg/dL — ABNORMAL HIGH (ref 8–23)
CO2: 33 mmol/L — ABNORMAL HIGH (ref 22–32)
Calcium: 8.7 mg/dL — ABNORMAL LOW (ref 8.9–10.3)
Chloride: 103 mmol/L (ref 98–111)
Creatinine, Ser: 1.17 mg/dL (ref 0.61–1.24)
GFR calc Af Amer: >60 mL/min (ref >60)
GFR calc non Af Amer: >60 mL/min (ref >60)
Glucose, Bld: 165 mg/dL — ABNORMAL HIGH (ref 70–99)
Potassium: 3.8 mmol/L (ref 3.5–5.1)
Sodium: 143 mmol/L (ref 135–145)

## 2019-07-05 LAB — CBC WITH DIFFERENTIAL/PLATELET
Abs Immature Granulocytes: 0.06 10*3/uL (ref 0.00–0.07)
Basophils Absolute: 0.1 10*3/uL (ref 0.0–0.1)
Basophils Relative: 1 %
Eosinophils Absolute: 0.2 10*3/uL (ref 0.0–0.5)
Eosinophils Relative: 2 %
HCT: 33.2 % — ABNORMAL LOW (ref 39.0–52.0)
Hemoglobin: 9.4 g/dL — ABNORMAL LOW (ref 13.0–17.0)
Immature Granulocytes: 1 %
Lymphocytes Relative: 10 %
Lymphs Abs: 1.3 10*3/uL (ref 0.7–4.0)
MCH: 24.9 pg — ABNORMAL LOW (ref 26.0–34.0)
MCHC: 28.3 g/dL — ABNORMAL LOW (ref 30.0–36.0)
MCV: 88.1 fL (ref 80.0–100.0)
Monocytes Absolute: 0.7 10*3/uL (ref 0.1–1.0)
Monocytes Relative: 6 %
Neutro Abs: 10.7 10*3/uL — ABNORMAL HIGH (ref 1.7–7.7)
Neutrophils Relative %: 80 %
Platelets: 278 10*3/uL (ref 150–400)
RBC: 3.77 MIL/uL — ABNORMAL LOW (ref 4.22–5.81)
RDW: 18.6 % — ABNORMAL HIGH (ref 11.5–15.5)
WBC: 13 10*3/uL — ABNORMAL HIGH (ref 4.0–10.5)
nRBC: 0 % (ref 0.0–0.2)

## 2019-07-05 LAB — GLUCOSE, CAPILLARY
Glucose-Capillary: 121 mg/dL — ABNORMAL HIGH (ref 70–99)
Glucose-Capillary: 110 mg/dL — ABNORMAL HIGH (ref 70–99)
Glucose-Capillary: 135 mg/dL — ABNORMAL HIGH (ref 70–99)
Glucose-Capillary: 127 mg/dL — ABNORMAL HIGH (ref 70–99)

## 2019-07-05 MED ORDER — LACTATED RINGERS IV SOLN
INTRAVENOUS | Status: DC
  Administered 2019-07-05 – 2019-07-06 (×2): via INTRAVENOUS

## 2019-07-05 MED ORDER — MIDODRINE HCL 5 MG PO TABS
5.0000 mg | ORAL_TABLET | Freq: Three times a day (TID) | ORAL | Status: DC
  Administered 2019-07-05 – 2019-07-09 (×13): 5 mg via ORAL
  Filled 2019-07-05 (×13): qty 1

## 2019-07-05 MED ORDER — LACTATED RINGERS IV BOLUS
500.0000 mL | Freq: Once | INTRAVENOUS | Status: AC
  Administered 2019-07-05: 12:00:00 500 mL via INTRAVENOUS

## 2019-07-05 NOTE — Progress Notes (Signed)
Pharmacy Electrolyte Monitoring Consult:  Pharmacy consulted to assist in monitoring and replacing electrolytes in this 68 y.o. male admitted on 06/12/2019 with Respiratory Distress  Labs:  Sodium (mmol/Lamb)  Date Value  07/05/2019 143  05/09/2016 141  11/22/2013 133 (Lamb)   Potassium (mmol/Lamb)  Date Value  07/05/2019 3.8  11/22/2013 3.7   Magnesium (mg/dL)  Date Value  07/04/2019 2.3  11/22/2013 1.8   Phosphorus (mg/dL)  Date Value  06/22/2019 3.7   Calcium (mg/dL)  Date Value  07/05/2019 8.7 (Lamb)   Calcium, Total (mg/dL)  Date Value  11/22/2013 9.0   Albumin (g/dL)  Date Value  06/12/2019 2.9 (Lamb)  04/18/2018 4.3    Assessment/Plan: Lasix being held. Will defer replacement.   BMP in am.   Replace for goal potassium ~ 4 and goal magnesium ~2.   Pharmacy will continue to monitor and adjust per consult   Colton Lamb 07/05/2019 5:22 PM

## 2019-07-05 NOTE — Progress Notes (Signed)
Occupational Therapy Treatment Patient Details Name: Colton Lamb MRN: [id number] DOB: [birth year] Today's Date: 07/05/2019    History of present illness Colton Lamb presented to ER secondary to respiratory distress; admitted for management of acute hypoxic/hypercarbic respiratory failure (requiring BiPAP at initial presentation). Pt is now s/p transverse loop colostomy, and required reintubation due to increased oxygen requirement and hypoxemia. Patient was stabilized and was ultimately extubated however was noted to be hypotensive and required vasopressor infusion for blood pressure support. Now in CCU. Of note, patient with large, chronic stage IV sacral decubitus ulcer.   OT comments  Colton Lamb was seen for OT treatment on this date. Pt cleared by primary RN to participate in UB ther-ex this date. Upon arrival to room pt awake/alert, denies pain/discomfort this date and agreeable to OT tx session. OT engages pt in UE therapeutic exercises described below to improve/maintain BUE strength, fine motor coordination, and functional use this date. Pt responds well to treatment with no adverse signs/symptoms reported or observed during session. Pt making progressing toward OT goals and continues to benefit from skilled OT services to maximize return to PLOF and minimize risk of future falls, injury, caregiver burden, and readmission. Will continue to follow POC as written. Discharge recommendation remains appropriate.    Follow Up Recommendations  SNF    Equipment Recommendations  Tub/shower seat    Recommendations for Other Services      Precautions / Restrictions Precautions Precautions: Fall Precaution Comments: sacral wound stage 4 Restrictions Weight Bearing Restrictions: No       Mobility Bed Mobility Overal bed mobility: (Deferred for pt comfort/safety this date.)             General bed mobility comments: this session focused on UE therex  Transfers Overall  transfer level: Needs assistance               General transfer comment: unable to stand    Balance Overall balance assessment: Needs assistance                                         ADL either performed or assessed with clinical judgement   ADL Overall ADL's : Needs assistance/impaired                                       General ADL Comments: Pt continues to require max A for LB ADL from EOB, +2 for OOB attempts. Would benefit from AE education for LB ADL management s/p  transverse loop colostomy.     Vision Baseline Vision/History: Wears glasses Wears Glasses: Reading only Patient Visual Report: No change from baseline     Perception     Praxis      Cognition Arousal/Alertness: Awake/alert Behavior During Therapy: WFL for tasks assessed/performed Overall Cognitive Status: Within Functional Limits for tasks assessed                                          Exercises Shoulder Exercises Elbow Flexion: AROM;Both;5 reps Elbow Extension: AROM;Both;5 reps Wrist Flexion: AROM;Both;10 reps Wrist Extension: AROM;Both;10 reps Hand Activities Laces, Tie, Button, Snap: Both;Other reps (comment)(2x3 tie/untie knots in pillow case.)   Shoulder Instructions  General Comments      Pertinent Vitals/ Pain       Pain Assessment: No/denies pain  Home Living                                          Prior Functioning/Environment              Frequency  Min 1X/week        Progress Toward Goals  OT Goals(current goals can now be found in the care plan section)     Acute Rehab OT Goals Patient Stated Goal: to get stronger and get home OT Goal Formulation: With patient Time For Goal Achievement: 07/09/19 Potential to Achieve Goals: Good  Plan Discharge plan remains appropriate;Frequency remains appropriate    Co-evaluation                 AM-PAC OT "6 Clicks" Daily  Activity     Outcome Measure   Help from another person eating meals?: None Help from another person taking care of personal grooming?: A Little Help from another person toileting, which includes using toliet, bedpan, or urinal?: Total Help from another person bathing (including washing, rinsing, drying)?: A Lot Help from another person to put on and taking off regular upper body clothing?: A Little Help from another person to put on and taking off regular lower body clothing?: Total 6 Click Score: 14    End of Session    OT Visit Diagnosis: Other abnormalities of gait and mobility (R26.89);Muscle weakness (generalized) (M62.81);History of falling (Z91.81)   Activity Tolerance Patient tolerated treatment well   Patient Left in bed;with call bell/phone within reach;with bed alarm set;with SCD's reapplied   Nurse Communication          Time: 0511-0211 OT Time Calculation (min): 16 min  Charges: OT General Charges $OT Visit: 1 Visit OT Treatments $Therapeutic Exercise: 8-22 mins  Shara Blazing, M.S., OTR/L Ascom: [phone] 07/05/19, 1:21 PM

## 2019-07-05 NOTE — Consult Note (Signed)
[place] Nurse wound follow up Wound type:Stage 4 sacral wound with NPWT (VAC) dressing change.  Measurement:see 12/2  Unchanged. Undermining extends 5 cm bone visible in wound bed.  Wound LGX:QJJHE red with granulation tissue noted today.  Drainage (amount, consistency, odor) moderate serosanguinous   Periwound intact  Bridging dressing to left trochanter today to offload pressure to sacrum.   Dressing procedure/placement/frequency: 1 piece black foam to wound bed  And 1 piece black foam for bridge.  Pectin barrier ring to periwound to promote seal.  [facility] Nurse ostomy follow up Stoma type/location: LLQ colostomy with plastic retention rod sutured in place.   Stomal assessment/size: 2"  Requires 4 " pouch and barrier ring to seal.  Peristomal assessment: retention rod create challenge to seal.  Optimized seal with barrier ring and 4" pouch until retention rod removed.  Treatment options for stomal/peristomal skin: see above Output blood tinged liquid in pouch Ostomy pouching: 2pc. 4" pouch  1 extra at bedside  Education provided: Discused twice weekly changes and that we would be teaching self care in the coming days when rod is removed.  Enrolled patient in [place] Start Discharge program: No  WOc team will follow.  Domenic Moras MSN, RN, FNP-BC CWON Wound, Ostomy, Continence Nurse Pager [phone]

## 2019-07-05 NOTE — Progress Notes (Signed)
[place] [facility] Day(s): 23.   Post op day(s): 2 Days Post-Op.   Interval History: Patient seen and examined no acute events or new complaints overnight.  Patient reports he is doing well this morning No abdominal pain, nausea, or emesis BP 104/68, on 4 mcg of Levophed this morning Leukocytosis improved to 13k, no fevers Good urine output   Vital signs in last 24 hours: [min-max] current  Temp:  [97.6 F (36.4 C)-98 F (36.7 C)] 98 F (36.7 C) (12/04 0400) Pulse Rate:  [52-78] 61 (12/04 0600) Resp:  [10-25] 21 (12/04 0600) BP: (88-137)/(55-96) 88/76 (12/04 0600) SpO2:  [95 %-100 %] 100 % (12/04 0600) FiO2 (%):  [28 %-40 %] 28 % (12/03 1300)     Height: 6' (182.9 cm) Weight: 108 kg BMI (Calculated): 32.27   Intake/Output last 2 shifts:  12/03 0701 - 12/04 0700 In: 800 [I.V.:593.4; IV Piggyback:206.6] Out: 1500 [Urine:1500]   Physical Exam:  Constitutional: alert, cooperative and no distress  Respiratory: breathing non-labored at rest Gastrointestinal: soft, non-tender, and non-distended, unmatured loop colostomy in LLQ was red retention rod in place, serosanguinous output in bag Genitourinary: Foley catheter in place Integumentary: warm, dry, previous laparotomy incision well healed   Labs:  CBC Latest Ref Rng & Units 07/05/2019 07/04/2019 07/03/2019  WBC 4.0 - 10.5 K/uL 13.0(H) 14.1(H) 9.7  Hemoglobin 13.0 - 17.0 g/dL 9.4(L) 9.9(L) 9.4(L)  Hematocrit 39.0 - 52.0 % 33.2(L) 33.0(L) 31.8(L)  Platelets 150 - 400 K/uL 278 317 262   CMP Latest Ref Rng & Units 07/05/2019 07/04/2019 07/03/2019  Glucose 70 - 99 mg/dL 165(H) 180(H) 109(H)  BUN 8 - 23 mg/dL 30(H) 27(H) 19  Creatinine 0.61 - 1.24 mg/dL 1.17 1.17 0.93  Sodium 135 - 145 mmol/L 143 144 144  Potassium 3.5 - 5.1 mmol/L 3.8 4.6 3.4(L)  Chloride 98 - 111 mmol/L 103 103 99  CO2 22 - 32 mmol/L 33(H) 31 32  Calcium 8.9 - 10.3 mg/dL 8.7(L) 8.7(L) 8.8(L)  Total Protein 6.5 - 8.1  g/dL - - -  Total Bilirubin 0.3 - 1.2 mg/dL - - -  Alkaline Phos 38 - 126 U/L - - -  AST 15 - 41 U/L - - -  ALT 0 - 44 U/L - - -     Imaging studies: No new pertinent imaging studies   Assessment/Plan:  68 y.o. 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   - Okay to initiate diet; CLD; advance as tolerated   - Plan to mature ostomy on 12/07; if develops obstruction can mature earlier  - Continue current wound management plan; wound vac; appreciate WOC RN assistance    - Can place fecal management system in the interim for stool management   - Continue IV ABx; ID Following; appreciate recommendations             - Supportive care per [facility] team & Medicine service   All of the above findings and recommendations were discussed with the patient, and the medical team, and all of patient's questions were answered to his expressed satisfaction.  -- Edison Simon, PA-C [facility] 07/05/2019, 7:40 AM [phone] M-F: 7am - 4pm

## 2019-07-05 NOTE — Progress Notes (Signed)
CRITICAL CARE CONSULTATION NOTE    Name: Colton Lamb MRN: [id number] DOB: [birth year]     LOS: 52   Referring physician: Dr. Gaylord Shih   SUBJECTIVE FINDINGS & SIGNIFICANT EVENTS   Patient description:   Colton Lamb  is a 68 y.o. obese Caucasian male with a known history of systolic CHF, coronary artery disease hypertension dyslipidemia and Crohn's disease, presented to the emergency room with acute onset of worsening dyspnea with associated dry cough and wheezing. He he admitted to orthopnea and paroxysmal nocturnal dyspnea as well as dyspnea on exertion with lower extremity edema.  No fever or chills.  No nausea or vomiting.  No dysuria, oliguria or hematuria or flank pain.  No recent exposure to COVID-19.  He was tested negative about a week prior to admission.  Upon presentation to the emergency room, his heart rate was 102 and respiratory to 34 with pulse oximetry of 85% on CPAP and later on 100% on BiPAP.  His BNP was 731 and high-sensitivity troponin I of 34.  CBC showed leukocytosis and anemia with neutrophilia and CMP showed a CO2 of 42 with a BUN of 24 and creatinine 0.98 alk phos of 160 with otherwise normal LFTs except for albumin 2.9.  Venous blood gas with a pH 7.38 and bicarbonate of 50.9.  COVID-19 negative  Portable chest ray showed vascular congestion and small pleural effusions with bibasal atelectasis that are new compared to prior exam.  His AICD was in place.  EKG showed atrial fibrillation with a rapid rate of 110 with left intravesicular block and prolonged QT interval with QTC of 568 MS. patient had evaluation by surgery for stage IV sacral decubitus with osteomyelitis.  Patient underwent transverse loop colostomy and was postoperatively brought back to the PACU however required reintubation  due to increased oxygen requirement and hypoxemia.  Subsequently patient was stabilized and was ultimately extubated however was noted to be hypotensive and required vasopressor infusion for blood pressure support, critical care consultation was placed by anesthesia team for further management.  Lines / Drains: PIV x 4  Cultures / Sepsis markers: n/a  Antibiotics: zosyn   Protocols / Consultants: Surgery & hospitalist service  12/3 - placed on BIPAP due to significant hypercapnia, this am improved mentation, repeat ABG reviewed. Wife at bedside reviewed hospital course and answered questions.  BiPAP adjsuted with increased driving pressure currently on 20/5 12/4- remains on levophed 62mg, weaning, mentation improved AAxself and place, diet advanced to clears   PAST MEDICAL HISTORY   Past Medical History:  Diagnosis Date   Atrial flutter ([place]    a. s/p Cardioversion 11/22/13, on amiodarone and Xarelto.   CHF (congestive heart failure) (HCC)    Chronic osteomyelitis ([place] 101/56/1537  Chronic systolic heart failure ([place]    a. 10/2013 EF 20-25%, grade III DD, RV mildly dilated and sys fx mild/mod reduced;  b. 01/2014 Echo: EF 30-35%, gr3 DD, mod dil LA.   Coronary artery disease    a. s/p MI 2007/2015;  b. s/p prior PCI to the LAD/LCX/PDA/PL;  c. 2008: s/p Cypher DES to the OM.   Crohn's ileocolitis ([place]    GERD (gastroesophageal reflux disease)    Hx of adenomatous colonic polyps 11/2003   Hyperlipidemia    Hypertension    Ischemic cardiomyopathy    s. 01/2014 s/p MDT [id number] EGwyneth RevelsXT DR single lead AICD.   Obesity    Paroxysmal atrial fibrillation (HCC)    a. CHA2DS2VASc = 4-->xarelto/amio.  Sleep apnea    Syncope    a.  11/2013 in setting of volume depletion and bradycardia due to dig toxicity    Type II diabetes mellitus ([place])      SURGICAL HISTORY   Past Surgical History:  Procedure Laterality Date   ATRIAL FLUTTER ABLATION N/A 04/16/2014    Procedure: ATRIAL FLUTTER ABLATION;  Surgeon: Evans Lance, MD;  [facility] LAB;  Service: Cardiovascular;  Laterality: N/A;   CARDIAC CATHETERIZATION  10/2013   CARDIAC DEFIBRILLATOR PLACEMENT  04/16/2014   Medtronic Evira device   CARDIAC ELECTROPHYSIOLOGY STUDY AND ABLATION  04/16/2014   atrial flutter ablation   CARDIOVERSION N/A 03/05/2014   Procedure: CARDIOVERSION;  Surgeon: Jolaine Artist, MD;  Location: [place];  Service: Cardiovascular;  Laterality: N/A;   CATARACT EXTRACTION W/PHACO Right 01/04/2017   Procedure: CATARACT EXTRACTION PHACO AND INTRAOCULAR LENS PLACEMENT ([place])  Right Diabetic Complicated;  Surgeon: Leandrew Koyanagi, MD;  Location: [place];  Service: Ophthalmology;  Laterality: Right;  Diabetic   CATARACT EXTRACTION W/PHACO Left 02/08/2017   Procedure: CATARACT EXTRACTION PHACO AND INTRAOCULAR LENS PLACEMENT ([facility]) left diabetic;  Surgeon: Leandrew Koyanagi, MD;  Location: [place];  Service: Ophthalmology;  Laterality: Left;  Diabetic - oral meds sleep apnea   CORONARY ANGIOPLASTY WITH STENT PLACEMENT  2007; 2008 X 2   "1+1 ~ 1"   FOOT SURGERY Left    bone spur   HYDROCELE EXCISION Bilateral    Ileocecal resection and sigmoid enterocolonic fistula repair  09/1998   IMPLANTABLE CARDIOVERTER DEFIBRILLATOR IMPLANT N/A 04/16/2014   Procedure: IMPLANTABLE CARDIOVERTER DEFIBRILLATOR IMPLANT;  Surgeon: Evans Lance, MD;  Location[facility] CATH LAB;  Service: Cardiovascular;  Laterality: N/A;   LEFT HEART CATHETERIZATION WITH CORONARY ANGIOGRAM N/A 11/22/2013   Procedure: LEFT HEART CATHETERIZATION WITH CORONARY ANGIOGRAM;  Surgeon: Sinclair Grooms, MD;  Location: [facility] CATH LAB;  Service: Cardiovascular;  Laterality: N/A;   TRANSVERSE LOOP COLOSTOMY N/A 07/03/2019   Procedure: TRANSVERSE LOOP COLOSTOMY;  Surgeon: Ronny Bacon, MD;  Location: [facility]S;  Service: General;  Laterality: N/A;     FAMILY HISTORY   Family  History  Problem Relation Age of Onset   Breast cancer Mother    Heart disease Father    Heart attack Father    Colon cancer Neg Hx      SOCIAL HISTORY   Social History   Tobacco Use   Smoking status: Never Smoker   Smokeless tobacco: Never Used  Substance Use Topics   Alcohol use: No   Drug use: No     MEDICATIONS   Current Medication:  Current Facility-Administered Medications:    0.9 %  sodium chloride infusion, , Intravenous, PRN, Ronny Bacon, MD, Stopped at 06/29/19 1035   acetaminophen (TYLENOL) tablet 650 mg, 650 mg, Oral, Q4H PRN, Ronny Bacon, MD   albuterol (VENTOLIN HFA) 108 (90 Base) MCG/ACT inhaler 1 puff, 1 puff, Inhalation, Q6H PRN, Ronny Bacon, MD   ALPRAZolam Duanne Moron) tablet 0.25 mg, 0.25 mg, Oral, BID PRN, Ronny Bacon, MD   aspirin EC tablet 81 mg, 81 mg, Oral, Daily, Ronny Bacon, MD, 81 mg at 07/05/19 1013   atorvastatin (LIPITOR) tablet 80 mg, 80 mg, Oral, Daily, Ronny Bacon, MD, 80 mg at 07/02/19 1855   Chlorhexidine Gluconate Cloth 2 % PADS 6 each, 6 each, Topical, Q0600, Ronny Bacon, MD, 6 each at 07/04/19 1559   feeding supplement (NEPRO CARB STEADY) liquid 237 mL, 237 mL, Oral, TID BM, Ronny Bacon,  MD, 237 mL at 07/05/19 1016   finasteride (PROSCAR) tablet 5 mg, 5 mg, Oral, Daily, Ronny Bacon, MD, 5 mg at 07/05/19 1013   furosemide (LASIX) tablet 60 mg, 60 mg, Oral, BID, Ronny Bacon, MD, 60 mg at 07/05/19 0749   guaiFENesin-dextromethorphan (ROBITUSSIN DM) 100-10 MG/5ML syrup 10 mL, 10 mL, Oral, Q4H PRN, Ronny Bacon, MD   HYDROcodone-acetaminophen (NORCO/VICODIN) 5-325 MG per tablet 1 tablet, 1 tablet, Oral, Q4H PRN, Ronny Bacon, MD   insulin aspart (novoLOG) injection 0-9 Units, 0-9 Units, Subcutaneous, TID WC, Ronny Bacon, MD, 1 Units at 07/04/19 1559   lactobacillus (FLORANEX/LACTINEX) granules 1 g, 1 g, Oral, TID WC, Ronny Bacon, MD, 1 g at 07/02/19 1700    multivitamin-lutein (OCUVITE-LUTEIN) capsule, , Oral, Daily, Ronny Bacon, MD, 1 capsule at 07/05/19 1015   niacin (NIASPAN) CR tablet 1,000 mg, 1,000 mg, Oral, Daily, Ronny Bacon, MD, 1,000 mg at 07/05/19 1013   norepinephrine (LEVOPHED) 4 mg in sodium chloride 0.9 % 250 mL (0.016 mg/mL) infusion, 0-40 mcg/min, Intravenous, Titrated, [place], Amy, MD, Last Rate: 18.75 mL/hr at 07/05/19 0629, 5 mcg/min at 07/05/19 0629   ondansetron (ZOFRAN) injection 4 mg, 4 mg, Intravenous, Q6H PRN, Ronny Bacon, MD, 4 mg at 07/03/19 1132   pantoprazole (PROTONIX) EC tablet 40 mg, 40 mg, Oral, Daily, Ronny Bacon, MD, 40 mg at 07/05/19 1013   piperacillin-tazobactam (ZOSYN) IVPB 3.375 g, 3.375 g, Intravenous, Q8H, Ronny Bacon, MD, Last Rate: 12.5 mL/hr at 07/05/19 0600   potassium chloride SA (KLOR-CON) CR tablet 20 mEq, 20 mEq, Oral, Daily, Ronny Bacon, MD, 20 mEq at 07/05/19 1013   potassium chloride SA (KLOR-CON) CR tablet 40 mEq, 40 mEq, Oral, Once, Ronny Bacon, MD   sodium chloride flush (NS) 0.9 % injection 10-40 mL, 10-40 mL, Intracatheter, Q12H, Ronny Bacon, MD, 10 mL at 07/05/19 1014   sodium chloride flush (NS) 0.9 % injection 10-40 mL, 10-40 mL, Intracatheter, PRN, Ronny Bacon, MD, 10 mL at 06/30/19 1800   tamsulosin (FLOMAX) capsule 0.4 mg, 0.4 mg, Oral, QPC breakfast, Ronny Bacon, MD, 0.4 mg at 07/05/19 1013   vitamin C (ASCORBIC ACID) tablet 500 mg, 500 mg, Oral, BID, Ronny Bacon, MD, 500 mg at 07/05/19 0749   zolpidem (AMBIEN) tablet 5 mg, 5 mg, Oral, QHS PRN, Ronny Bacon, MD    ALLERGIES   Iodine, Shrimp [shellfish allergy], and Tetracycline    REVIEW OF SYSTEMS     Unable to obtain due to poorly responsive state.   PHYSICAL EXAMINATION   Vital Signs: Temp:  [97.5 F (36.4 C)-98 F (36.7 C)] 97.5 F (36.4 C) (12/04 0745) Pulse Rate:  [52-81] 77 (12/04 1100) Resp:  [10-25] 25 (12/04 1100) BP: (88-115)/(56-78)  102/66 (12/04 1000) SpO2:  [95 %-100 %] 100 % (12/04 0900) FiO2 (%):  [28 %] 28 % (12/03 1300)  GENERAL:NAD HEAD: Normocephalic, atraumatic.  EYES: Pupils equal, round, reactive to light.  No scleral icterus.  MOUTH: Moist mucosal membrane. NECK: Supple. No thyromegaly. No nodules. No JVD.  PULMONARY: CTAB CARDIOVASCULAR: S1 and S2. Regular rate and rhythm. No murmurs, rubs, or gallops.  GASTROINTESTINAL: Soft, nontender, non-distended. No masses. Positive bowel sounds. No hepatosplenomegaly.  MUSCULOSKELETAL: No swelling, clubbing, or edema.  NEUROLOGIC: Mild distress due to acute illness SKIN:intact,warm,dry   PERTINENT DATA     Infusions:  sodium chloride Stopped (06/29/19 1035)   norepinephrine (LEVOPHED) 63m / 254minfusion 5 mcg/min (07/05/19 0629)   piperacillin-tazobactam (ZOSYN)  IV 12.5 mL/hr at 07/05/19 0600   Scheduled  Medications:  aspirin EC  81 mg Oral Daily   atorvastatin  80 mg Oral Daily   Chlorhexidine Gluconate Cloth  6 each Topical Q0600   feeding supplement (NEPRO CARB STEADY)  237 mL Oral TID BM   finasteride  5 mg Oral Daily   furosemide  60 mg Oral BID   insulin aspart  0-9 Units Subcutaneous TID WC   lactobacillus  1 g Oral TID WC   multivitamin-lutein   Oral Daily   niacin  1,000 mg Oral Daily   pantoprazole  40 mg Oral Daily   potassium chloride  20 mEq Oral Daily   potassium chloride  40 mEq Oral Once   sodium chloride flush  10-40 mL Intracatheter Q12H   tamsulosin  0.4 mg Oral QPC breakfast   vitamin C  500 mg Oral BID   PRN Medications: sodium chloride, acetaminophen, albuterol, ALPRAZolam, guaiFENesin-dextromethorphan, HYDROcodone-acetaminophen, ondansetron (ZOFRAN) IV, sodium chloride flush, zolpidem Hemodynamic parameters:   Intake/Output: 12/03 0701 - 12/04 0700 In: 800 [I.V.:593.4; IV Piggyback:206.6] Out: 1500 [Urine:1500]  Ventilator  Settings: FiO2 (%):  [28 %] 28 %    LAB RESULTS:  Basic Metabolic  Panel: Recent Labs  Lab 06/29/19 0516  06/30/19 0523 07/01/19 0450 07/02/19 0457 07/03/19 0514 07/04/19 0438 07/05/19 0425  NA 141  --  140 143 143 144 144 143  K 3.1*   < > 3.6 3.5 3.5 3.4* 4.6 3.8  CL 95*  --  98 99 101 99 103 103  CO2 31  --  _0 32 31 33*  GLUCOSE 144*  --  140* 125* 113* 109* 180* 165*  BUN 17  --  _1 27* 30*  CREATININE 0.79  --  0.83 0.70 0.66 0.93 1.17 1.17  CALCIUM 8.0*  --  8.6* 8.9 8.9 8.8* 8.7* 8.7*  MG 1.9  --  1.9  --  2.1  --  2.3  --    < > = values in this interval not displayed.   Liver Function Tests: No results for input(s): AST, ALT, ALKPHOS, BILITOT, PROT, ALBUMIN in the last 168 hours. No results for input(s): LIPASE, AMYLASE in the last 168 hours. No results for input(s): AMMONIA in the last 168 hours. CBC: Recent Labs  Lab 07/01/19 0450 07/02/19 0457 07/03/19 0514 07/04/19 0438 07/05/19 0425  WBC 11.2* 9.5 9.7 14.1* 13.0*  NEUTROABS 8.8* 7.2 7.2 12.9* 10.7*  HGB 9.4* 9.4* 9.4* 9.9* 9.4*  HCT 30.3* 31.8* 31.8* 33.0* 33.2*  MCV 81.5 83.9 84.6 84.2 88.1  PLT 284 280 262 317 278   Cardiac Enzymes: No results for input(s): CKTOTAL, CKMB, CKMBINDEX, TROPONINI in the last 168 hours. BNP: Invalid input(s): POCBNP CBG: Recent Labs  Lab 07/04/19 0751 07/04/19 1151 07/04/19 1551 07/04/19 2206 07/05/19 0807  GLUCAP 158* 141* 122* 127* 121*     IMAGING RESULTS:    ASSESSMENT AND PLAN    -Multidisciplinary rounds held today  Post-operative circulatory shock -currently on levophed drip for vasopressor support -related to anesthesia with background hx of OSA and CHF -patient is poorly arousable but oxygenating well   Hypercapnic respiratory failure -S/p NIV - currenlty on BIPAP  -ABG reviewed today - settings adjusted to increased driving pressure to 71/2   Chronic systolic CHF with EF 19% S/p PPM/AICD -oxygen as needed -Lasix as tolerated ICU monitoring -cardiology on case - appreciate  input  Stage 4 decubitus sacral ulcer  - surgery on case - appreciate reccommendations  ID -continue  IV abx as prescibed -follow up cultures  GI/Nutrition GI PROPHYLAXIS as indicated DIET-->TF's as tolerated Constipation protocol as indicated  ENDO - ICU hypoglycemic\Hyperglycemia protocol -check FSBS per protocol   ELECTROLYTES -follow labs as needed -replace as needed -pharmacy consultation   DVT/GI PRX ordered -SCDs  TRANSFUSIONS AS NEEDED MONITOR FSBS ASSESS the need for LABS as needed   Critical care provider statement:    Critical care time (minutes):  32   Critical care time was exclusive of:  Separately billable procedures and treating other patients   Critical care was necessary to treat or prevent imminent or life-threatening deterioration of the following conditions:  circulatory shock, respiratory failure, chf, osa, sacral decubitus   Critical care was time spent personally by me on the following activities:  Development of treatment plan with patient or surrogate, discussions with consultants, evaluation of patient's response to treatment, examination of patient, obtaining history from patient or surrogate, ordering and performing treatments and interventions, ordering and review of laboratory studies and re-evaluation of patient's condition.  I assumed direction of critical care for this patient from another provider in my specialty: no    This document was prepared using Dragon voice recognition software and may include unintentional dictation errors.    Ottie Glazier, M.D.  Division of [place]

## 2019-07-05 NOTE — Progress Notes (Signed)
[facility] [place] NAME: Colton Lamb    MR#:  [id number]  DATE OF BIRTH:  1950/10/31  SUBJECTIVE:  patient more awake. He is currently off BiPAP Currently on low-dose IV levophed 3 mcg no new complaints. Overall having a good day  wound VAC was changed by wound RN REVIEW OF SYSTEMS:   Review of Systems  Constitutional: Negative for chills, fever and weight loss.  HENT: Negative for ear discharge, ear pain and nosebleeds.   Eyes: Negative for blurred vision, pain and discharge.  Respiratory: Positive for shortness of breath. Negative for sputum production, wheezing and stridor.   Cardiovascular: Negative for chest pain, palpitations, orthopnea and PND.  Gastrointestinal: Negative for abdominal pain, diarrhea, nausea and vomiting.  Genitourinary: Negative for frequency and urgency.  Musculoskeletal: Negative for back pain and joint pain.  Neurological: Positive for weakness. Negative for sensory change, speech change and focal weakness.  Psychiatric/Behavioral: Negative for depression and hallucinations. The patient is not nervous/anxious.     DRUG ALLERGIES:   Allergies  Allergen Reactions  . Iodine Other (See Comments)    Shortness of breath, swelling and hives  . Shrimp [Shellfish Allergy] Other (See Comments)    SWELLING    HIVES    SHORTNESS OF BREATH  . Tetracycline Rash    VITALS:  Blood pressure 104/74, pulse 73, temperature 98 F (36.7 C), temperature source Oral, resp. rate 17, height 6' (1.829 m), weight 108 kg, SpO2 100 %.  PHYSICAL EXAMINATION:   Physical Exam  GENERAL:  68 y.o.-year-old patient lying in the bed with mild acute distress. Chronically ill and on [place] oxygen EYES: Pupils equal, round, reactive to light and accommodation. No scleral icterus.  HEENT: Head atraumatic, normocephalic. Oropharynx and nasopharynx clear.  NECK:  Supple, no jugular venous distention. No thyroid enlargement, no tenderness.   LUNGS: Normal breath sounds bilaterally, no wheezing, rales, rhonchi. No use of accessory muscles of respiration.  CARDIOVASCULAR: S1, S2 normal. No murmurs, rubs, or gallops.  ABDOMEN: Soft, nontender, nondistended. Bowel sounds present. No organomegaly or mass. Diverting loop  Colostomy+, Foley+ EXTREMITIES:++ edema b/l --chronic NEUROLOGIC: Cranial nerves II through XII are intact. No focal Motor or sensory deficits b/l.   PSYCHIATRIC:  patient is alert and oriented x 3.  SKIN: wound VAC on the buttocks  LABORATORY PANEL:  CBC Recent Labs  Lab 07/05/19 0425  WBC 13.0*  HGB 9.4*  HCT 33.2*  PLT 278    Chemistries  Recent Labs  Lab 07/04/19 0438 07/05/19 0425  NA 144 143  K 4.6 3.8  CL 103 103  CO2 31 33*  GLUCOSE 180* 165*  BUN 27* 30*  CREATININE 1.17 1.17  CALCIUM 8.7* 8.7*  MG 2.3  --    Cardiac Enzymes No results for input(s): TROPONINI in the last 168 hours. RADIOLOGY:  No results found. ASSESSMENT AND PLAN:  68 year old man PMH systolic CHF, Crohn's disease presented with shortness of breath.  Treated with BiPAP, Lasix and admitted for acute CHF exacerbation.  1. Acute on chronic diastolic/systolic congestive heart failure with severe cardiomyopathy EF of 20 to 25% -appears you will awake. Continue oral Lasix digoxin spironolactone at Toprol-XL -ACE inhibitor/ARB defer to cardiology -cardiology recommends right and left heart catheterization however this is deferred until volume status is optimized and sacral decubitus is healed -DR end/[facility] cardiology following pt -patient currently off BIPAP--on 3 liter [place] -wean off levophed when able to  2. Stage IV sacral decubitus  present on admission with chronic osteomyelitis -status post wound vac placement of the buttock -diverting loop colostomy POD # 2 to help with wound healing-- by Dr. Christian Mate -on Zosyn -ID following for IV antibiotics -Foley catheter to be placed to avoid soiling of his Chronic  decubitus. Patient had Foley before as well. Condom catheter hard to place per staff -discussed with Dr. Hampton Abbot-- hold off on Xarelto till Dr. Lutricia Feil completes the colostomy procedure on Monday  3. Hypotension postop with cardiomyopathy -on low-dose IV pressers -goal blood pressure 90/50  As long as symptoms of hypotension do not develop. -Patient's blood pressure stays usually on the lower side  4. History of chronic atrial fibrillation -rate controlled -continue digoxin Toprol-XL -holding Xarelto  5. Diabetes type II, controlled -continue levemir sliding scale insulin -sugars appears stable  6. DVT prophylaxis SQ heparin.  7. Code status-- DNR. Patient and family (daughter-in-law) met with palliative care and goals of care were discussed.   Family communication : sister-in-law in the room Consults : surgery, ID Discharge Disposition : to be determined CODE STATUS: DNR   TOTAL TIME TAKING CARE OF THIS PATIENT: *25* minutes.  >50% time spent on counselling and coordination of care  POSSIBLE D/C IN *few* DAYS, DEPENDING ON CLINICAL CONDITION.  Note: This dictation was prepared with Dragon dictation along with smaller phrase technology. Any transcriptional errors that result from this process are unintentional.  [name] M.D on 07/05/2019 at 3:13 PM  Between 7am to 6pm - Pager - [phone]  After 6pm go to [email or web address]  [facility]   CC: Primary care physician; [name], MDPatient ID: Colton Lamb, male   DOB: 11-28-1950, 67 y.o.   MRN: [id number]

## 2019-07-05 NOTE — Progress Notes (Addendum)
Daily Progress Note   Patient Name: Colton Lamb       Date: 07/05/2019 DOB: [birth year]  Age: 68 y.o. MRN#: [id number] Attending Physician: [name], MD Primary Care Physician: [name], MD Admit Date: 06/12/2019  Reason for Consultation/Follow-up: Establishing goals of care  Subjective: Patient asleep but easily aroused with verbal stimuli. Denies pain or shortness of breath. Reports he is feeling much better today. Able to follow commands. States plans for surgeon to do something to his colostomy on Monday and hope that goes well.   I recapped our goals of care discussion from yesterday. Patient verbalized his remembrance. I clarified his POA/point-of contact given yesterday he expressed this would be his sister, Colton Lamb, however notations later in the day stated his son, Colton Lamb was his POA and to be involved in his care and decisions. Colton Lamb verbalized agreement. He expressed his sister is local and can get to him if needed, since his son lives in [place]. He states his son is indeed his primary contact if decisions are to be made and he is unable to make them himself. His son and patient's sister have a close relationship and communicate often. He states they both will always know what is going on and his son relies on her to be his eyes and ears in his absence.   Patient confirms his goals remain the same, he is hopeful for some improvement and maybe one day get back to his own home with family. He states he knows he will most likely be going back to [place] from here. He states he will just keep taking it one day at a time until then and see how "life pans out!"   1605: Spoke with son, Colton Lamb. He called and requested updates and also wanted to discuss DNR and POA role. He shared he is  actually the patient's step son and is concerned that although patient has expressed to him that he is his POA and patient has also informed staff that patient is his POA that he does not have this in writing. Colton Lamb is in agreement with serving on behalf as he looks to patient as his father, but does not want conflict in the event of an emergency or required need for decisions. Colton Lamb states he has expressed wishes to complete documents with patient  previously however, this has not been able to be done and patient does not seem to like to discuss such wishes as DNR or other heroic measures with him. He is in agreement with DNR but wish to have medical team request patient consider completing documents to be more comfortable to carry out his father's wishes. Son aware we will offer to patient to complete advanced directive and or MOST form and will place appropriate referral to spiritual support team if patient is in agreement. He verbalized understanding and appreciation. After speaking with patient again at the bedside he informed me that his AD should be in the chart. Patient's advanced directive is in the chart and states as patient expressed his son Colton Lamb is his designated POA (financial and health care). Voicemail left for son to inform him that documents were on file.   Length of Stay: 23  Current Medications: Scheduled Meds:  . aspirin EC  81 mg Oral Daily  . atorvastatin  80 mg Oral Daily  . Chlorhexidine Gluconate Cloth  6 each Topical Q0600  . feeding supplement (NEPRO CARB STEADY)  237 mL Oral TID BM  . finasteride  5 mg Oral Daily  . insulin aspart  0-9 Units Subcutaneous TID WC  . lactobacillus  1 g Oral TID WC  . multivitamin-lutein   Oral Daily  . niacin  1,000 mg Oral Daily  . pantoprazole  40 mg Oral Daily  . potassium chloride  20 mEq Oral Daily  . sodium chloride flush  10-40 mL Intracatheter Q12H  . tamsulosin  0.4 mg Oral QPC breakfast  . vitamin C  500 mg Oral BID     Continuous Infusions: . sodium chloride Stopped (06/29/19 1035)  . lactated ringers    . norepinephrine (LEVOPHED) 14m / 2547minfusion 5 mcg/min (07/05/19 0629)  . piperacillin-tazobactam (ZOSYN)  IV 12.5 mL/hr at 07/05/19 0600    PRN Meds: sodium chloride, acetaminophen, albuterol, ALPRAZolam, guaiFENesin-dextromethorphan, HYDROcodone-acetaminophen, ondansetron (ZOFRAN) IV, sodium chloride flush, zolpidem  Physical Exam        -awake, alert,  NAD -RRR -diminished, 3L/[place] -LLQ colostomy, non-tender, BS +x4 -A&O x3, mood appropriate  Vital Signs: BP 100/67   Pulse 77   Temp (!) 97.5 F (36.4 C) (Oral)   Resp (!) 25   Ht 6' (1.829 m)   Wt 108 kg   SpO2 100%   BMI 32.28 kg/m  SpO2: SpO2: 100 % O2 Device: O2 Device: Nasal Cannula O2 Flow Rate: O2 Flow Rate (L/min): 3 L/min  Intake/output summary:   Intake/Output Summary (Last 24 hours) at 07/05/2019 1226 Last data filed at 07/05/2019 0600 Gross per 24 hour  Intake 661.83 ml  Output 1500 ml  Net -838.17 ml   LBM: Last BM Date: 07/03/19 Baseline Weight: Weight: 119.4 kg Most recent weight: Weight: 108 kg       Palliative Assessment/Data:      Patient Active Problem List   Diagnosis Date Noted  . Chronic osteomyelitis (HCCathlamet11/29/2020  . PICC (peripherally inserted central catheter) flush   . Normocytic anemia 06/21/2019  . Stage IV pressure ulcer of sacral region (HCArthur11/18/2020  . Hypotension 06/19/2019  . Anemia   . Acute respiratory failure (HCGiddings11/06/2019  . Acute on chronic congestive heart failure (HCOld [place]  . Acute blood loss anemia   . Pressure injury of skin 03/17/2019  . Acute renal failure (ARF) (HCIron [place]  . Empyema lung (HCKing [place]  . Shortness of breath   . Pleural  effusion on right   . Sepsis ([place]) 03/04/2019  . Restless legs syndrome (RLS) 06/16/2017  . Hypertension, essential 06/16/2017  . Coronary artery disease involving native coronary artery of native heart without angina pectoris 04/27/2017   . Hydrocephalus ([place]) 09/08/2016  . Dizziness and giddiness   . Elevated troponin 09/06/2016  . Type II diabetes mellitus ([place])   . Ischemic cardiomyopathy   . Hyponatremia 07/01/2015  . Diabetes mellitus type 2, uncontrolled, with complications ([place]) 27/09/5007  . 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 OSA (obstructive sleep apnea) 12/20/2013  . Morbid obesity ([place]) 12/20/2013  . AKI (acute kidney injury) - Creatinine improved at d/c 12/14/2013  . Elevated TSH - will need f/u TFTs with PCP in 3-4 weeks 12/14/2013  . Junctional bradycardia - resolved 12/14/2013  . Syncope - due to bradycardia in setting of Digoxin Toxicity 12/11/2013  . Chronic systolic heart failure ([place]) 12/06/2013  . At risk for sudden cardiac death - on LifeVest 12/06/13  . Acute on chronic combined systolic and diastolic CHF (congestive heart failure) ([place]) 12/06/13  . Cardiomyopathy, ischemic 2013/12/06  . Atrial flutter ([place]) 11/22/2013  . NSTEMI (non-ST elevated myocardial infarction) ([place]) 11/22/2013  . Long term current use of anticoagulant 10/19/2010  . Hyperlipidemia 05/06/2010  . Edema 05/06/2010  . CAD S/P percutaneous coronary angioplasty - multiple PCIs 11/11/2008  . Atrial fibrillation ([place]) 11/11/2008  . GERD 11/11/2008  . [place] INTESTINE 11/11/2008  . COLONIC POLYPS, HX OF 11/11/2008  . [place] ALLERGY 11/11/2008    Palliative Care Assessment & Plan   Recommendations/Plan:  Continue with current plan of care per medical team  Patient remains hopeful for some improvement and possibly one day get back home. States until then he wishes to take it one day at a time.   Would benefit from continued outpatient palliative at discharge. He is currently active with [facility].   PMT is available if further needs or opportunity to engage. Goals are set. Please call or page if needed.   Goals of Care and Additional Recommendations:   Limitations on Scope of Treatment: Full Scope Treatment  Code Status:    Code Status Orders  (From admission, onward)         Start     Ordered   07/04/19 1137  Do not attempt resuscitation (DNR)  Continuous    Question Answer Comment  In the event of cardiac or respiratory ARREST Do not call a "code blue"   In the event of cardiac or respiratory ARREST Do not perform Intubation, CPR, defibrillation or ACLS   In the event of cardiac or respiratory ARREST Use medication by any route, position, wound care, and other measures to relive pain and suffering. May use oxygen, suction and manual treatment of airway obstruction as needed for comfort.      07/04/19 1136        Code Status History    Date Active Date Inactive Code Status Order ID Comments User Context   06/12/2019 0359 07/04/2019 1136 Full Code [id number]  Sidney Ace Arvella Merles, MD ED   04/21/2019 1857 04/26/2019 2045 Full Code [id number]  Demetrios Loll, MD Inpatient   03/04/2019 2021 03/19/2019 2042 Full Code [id number]  [name], MD Inpatient   09/07/2016 0100 09/09/2016 2001 Full Code [id number]  Harvie Bridge, DO Inpatient   04/16/2014 1552 04/17/2014 1512 Full Code [id number]  Evans Lance, MD Inpatient   12/11/2013 2042 12/14/2013 1516 Full Code [id number]  Eileen Stanford,  PA-C Inpatient   11/22/2013 1434 11/30/2013 2000 Full Code [id number]  Belva Crome, MD Inpatient   11/22/2013 [phone] 11/22/2013 1434 Full Code [id number]  Dorothy Spark, MD Inpatient   11/22/2013 [phone] 11/22/2013 0916 Full Code [id number]  Barrett, Felisa Bonier Inpatient   Advance Care Planning Activity    Advance Directive Documentation     Most Recent Value  Type of Advance Directive  Healthcare Power of Attorney, Living will  Pre-existing out of facility DNR order (yellow form or pink MOST form)  -  "MOST" Form in Place?  -     Prognosis:   Unable to determine Guarded  Discharge Planning:  To Be Determined  Care plan was discussed with patient and RN.    Thank you for allowing the Palliative Medicine Team to assist in the care of this patient.  Total Time: 45 min.    Greater than 50%  of this time was spent counseling and coordinating care related to the above assessment and plan.  Alda Lea, AGPCNP-BC Palliative Medicine Team   Please contact Palliative Medicine Team phone at [phone] for questions and concerns.

## 2019-07-05 NOTE — Progress Notes (Signed)
ID Pt awake and alert Feeling better Minimal pain in the abdomen at the surgical site Taking liquids  BP 121/77   Pulse 78   Temp 97.6 F (36.4 C) (Axillary)   Resp (!) 24   Ht 6' (1.829 m)   Wt 108 kg   SpO2 98%   BMI 32.28 kg/m    O/E awake and alert Oriented X4 Chest b/l air entry HS s1s2 Abd soft- colostomy Rt PICc Wound vac  CBC Latest Ref Rng & Units 07/05/2019 07/04/2019 07/03/2019  WBC 4.0 - 10.5 K/uL 13.0(H) 14.1(H) 9.7  Hemoglobin 13.0 - 17.0 g/dL 9.4(L) 9.9(L) 9.4(L)  Hematocrit 39.0 - 52.0 % 33.2(L) 33.0(L) 31.8(L)  Platelets 150 - 400 K/uL 278 317 262    CMP Latest Ref Rng & Units 07/05/2019 07/04/2019 07/03/2019  Glucose 70 - 99 mg/dL 165(H) 180(H) 109(H)  BUN 8 - 23 mg/dL 30(H) 27(H) 19  Creatinine 0.61 - 1.24 mg/dL 1.17 1.17 0.93  Sodium 135 - 145 mmol/L 143 144 144  Potassium 3.5 - 5.1 mmol/L 3.8 4.6 3.4(L)  Chloride 98 - 111 mmol/L 103 103 99  CO2 22 - 32 mmol/L 33(H) 31 32  Calcium 8.9 - 10.3 mg/dL 8.7(L) 8.7(L) 8.8(L)  Total Protein 6.5 - 8.1 g/dL - - -  Total Bilirubin 0.3 - 1.2 mg/dL - - -  Alkaline Phos 38 - 126 U/L - - -  AST 15 - 41 U/L - - -  ALT 0 - 44 U/L - - -    Impression/recommendation  Acute encephalopathy/unresponsiveness due to hypercarbia - resolved   Hypotension post op - circulatory shock- on low dose levophed  Stage IV sacral decubitus with chronic osteomyelitis  - because of immobility, stool contamination, the decubitus progressed over the past 4 months to become stage IV. If it has to heal, he needs to ambulate and as he is unable to do that, he got a diverting colostomy to prevent stool contamination , wound vac and IV antibiotics for 4 weeks ( on zosyn). End date of zosyn is 07/24/19   Ischemic cardiomyopathy with congestive heart failure with EF of 20 to 25%: Has AICD. Management as per primary team and cardiologist  Anemia:  Paroxysmal A. fib/atrial flutter  Congestive heart failure furosemide and  spironolactone discontinued due to hypotension .He is on midodrine  Diabetes mellitus on insulin  Chronic urinary Foley cathetersince August 2020 at that admission had urinary retention Foley was placed he was also started on tamsulosin and finasteride during that hospitalization. Foley catheter was removed on 12/1 and replaced on 07/04/19 in ICU  Right PICC line placed on 06/15/2019?  ID will sign off- call if needed

## 2019-07-05 NOTE — Progress Notes (Signed)
Progress Note  Patient Name: Colton Lamb Date of Encounter: 07/05/2019  Primary Cardiologist: Rockey Situ  Subjective   Status post diverting colostomy on 59/1 complicated by reintubation postoperatively secondary to increased oxygen requirement and hypoxia, weaned to nasal cannula. He remains on Levophed with SBP in the 90s to low 100s mmHg this morning. Ventricular rates remain well controlled. Remains off OAC/heparin post operatively. Digoxin held 12/3 secondary to level of 1.4 at that time. Off GDMT secondary to hypotension/vasopressor support.   No chest pain, SOB, or palpitations.   Inpatient Medications    Scheduled Meds:  aspirin EC  81 mg Oral Daily   atorvastatin  80 mg Oral Daily   Chlorhexidine Gluconate Cloth  6 each Topical Q0600   feeding supplement (NEPRO CARB STEADY)  237 mL Oral TID BM   finasteride  5 mg Oral Daily   furosemide  60 mg Oral BID   insulin aspart  0-9 Units Subcutaneous TID WC   lactobacillus  1 g Oral TID WC   multivitamin-lutein   Oral Daily   niacin  1,000 mg Oral Daily   pantoprazole  40 mg Oral Daily   potassium chloride  20 mEq Oral Daily   potassium chloride  40 mEq Oral Once   sodium chloride flush  10-40 mL Intracatheter Q12H   tamsulosin  0.4 mg Oral QPC breakfast   vitamin C  500 mg Oral BID   Continuous Infusions:  sodium chloride Stopped (06/29/19 1035)   norepinephrine (LEVOPHED) 73m / 2567minfusion 5 mcg/min (07/05/19 0629)   piperacillin-tazobactam (ZOSYN)  IV 12.5 mL/hr at 07/05/19 0600   PRN Meds: sodium chloride, acetaminophen, albuterol, ALPRAZolam, guaiFENesin-dextromethorphan, HYDROcodone-acetaminophen, ondansetron (ZOFRAN) IV, sodium chloride flush, zolpidem   Vital Signs    Vitals:   07/05/19 0700 07/05/19 0745 07/05/19 0800 07/05/19 0900  BP: 104/68  99/64 102/65  Pulse: 81 74 75 72  Resp: 18 12 (!) 21 20  Temp:  (!) 97.5 F (36.4 C)    TempSrc:  Oral    SpO2:  100% 100% 100%    Weight:      Height:        Intake/Output Summary (Last 24 hours) at 07/05/2019 1008 Last data filed at 07/05/2019 0600 Gross per 24 hour  Intake 661.83 ml  Output 1500 ml  Net -838.17 ml   Filed Weights   06/29/19 0500 06/29/19 0741 07/03/19 1043  Weight: 55.3 kg 55.3 kg 108 kg    Telemetry    Afib, 70s to 80s bpm, occasional PVCs - Personally Reviewed  ECG    No new tracings - Personally Reviewed  Physical Exam   GEN: No acute distress.   Neck: No JVD. Cardiac: Irregularly irregular, no murmurs, rubs, or gallops.  Respiratory: Diminished breath sounds bilaterally on BiPAP.  GI: Soft, nontender, non-distended.   MS: Edema much improved this addmission; No deformity. Neuro:  Alert and oriented x 3; Nonfocal.  Psych: Normal affect.  Labs    Chemistry Recent Labs  Lab 07/03/19 0514 07/04/19 0438 07/05/19 0425  NA 144 144 143  K 3.4* 4.6 3.8  CL 99 103 103  CO2 32 31 33*  GLUCOSE 109* 180* 165*  BUN 19 27* 30*  CREATININE 0.93 1.17 1.17  CALCIUM 8.8* 8.7* 8.7*  GFRNONAA >60 >60 >60  GFRAA >60 >60 >60  ANIONGAP 13 10 7      Hematology Recent Labs  Lab 07/03/19 0514 07/04/19 0438 07/05/19 0425  WBC 9.7 14.1* 13.0*  RBC  3.76* 3.92* 3.77*  HGB 9.4* 9.9* 9.4*  HCT 31.8* 33.0* 33.2*  MCV 84.6 84.2 88.1  MCH 25.0* 25.3* 24.9*  MCHC 29.6* 30.0 28.3*  RDW 19.2* 18.6* 18.6*  PLT 262 317 278    Cardiac EnzymesNo results for input(s): TROPONINI in the last 168 hours. No results for input(s): TROPIPOC in the last 168 hours.   BNPNo results for input(s): BNP, PROBNP in the last 168 hours.   DDimer No results for input(s): DDIMER in the last 168 hours.   Radiology    No results found.  Cardiac Studies   Echo  06/25/2019 1. Left ventricular ejection fraction, by visual estimation, is 20 to 25%. The left ventricle has severely decreased function. There is mildly increased left ventricular hypertrophy. 2. Severely dilated left ventricular internal  cavity size. 3. There is global hypokinesis with thinning/akinesis of the anterior and septal walls. 4. Global right ventricle was not well visualized.The right ventricular size is not assessed. Right vetricular wall thickness was not assessed. 5. Left atrial size was not assessed. 6. Right atrial size was not assessed. 7. Small pericardial effusion. 8. The pericardial effusion is circumferential. 9. Mild mitral annular calcification. 10. The mitral valve was not assessed. No assessment of mitral valve regurgitation. 11. The tricuspid valve is not assessed. Tricuspid valve regurgitation was not assessed. 12. Aortic valve regurgitation was not assessed. 13. The aortic valve was not assessed. Aortic valve regurgitation was not assessed. 14. The pulmonic valve was not well visualized. Pulmonic valve regurgitation was not assessed. 15. Aortic root could not be assessed. 16. The interatrial septum was not assessed.  Echo 02/08/2019 1. The left ventricle has moderately reduced systolic function, with an ejection fraction of 35-40%. The cavity size was normal. Left ventricular diastolic Doppler parameters are indeterminate. Probably severe hypokinesis of mid-distal anterior,  ateroseptal and apical myocardium. 2. The right ventricle has normal systolic function. The cavity was normal. There is no increase in right ventricular wall thickness. 3. Left atrial size was moderately dilated. 4. Very poor image quality in spite of using Definity. Valves not well visualized. Consider different diagnostic testing if indicated. 5. The aortic valve was not well visualized. Aortic valve regurgitation was not assessed by color flow Doppler.  Patient Profile     68 y.o. male with history of coronary artery disease status post prior PCI's to the LAD and LCx/OM, acute on chronic systolic heart failure, persistent atrial fibrillation, and atrial flutter status post ablation.  Assessment & Plan      1. Acute on chronic combined systolic and diastolic CHF s/p MDT ICD: -His volume status is significantly improved from presentation -Continue PO Lasix 60 mg bid -Digoxin level 1.4 morning of 12/3, continue to hold, resume at 0.0625 mg in a day or two -Hold Toprol XL, losartan, and spironolactone given he is requiring Levophed at this time, resume when able  2. CAD: -No symptoms concerning for angina -Continue ASA 81 mg -Given worsening LVSF and history of multivessel CAD s/p PCI, plan for R/LHC down the road, ideally after GDMT has been optimized and his sacral decubitus ulcer and osteomyelitis have been treated -Continue Lipitor    3. Persistent Afib: -Ventricular rates well controlled  -Digoxin level 1.4 morning of 12/3, continue to hold digoxin, resume at lower dose 0.0625 mg in a day or so -Hold Toprol XL, losartan, and spironolactone given his is requiring vasopressor support, resume when able -Xarelto was stopped after his dose on 11/30 in preparation for diverting ostomy  as below -Recommend resuming Xarelto when possible or starting heparin gtt if [place] will be held for an extended time and if there is no evidence of bleeding, once ok per surgery, await their input   4. Hypotension: -Placed on Levophed post operatively, wean as able -Midodrine has been stopped -Close monitoring with escalation of GDMT as tolerated   5. Sacral decubitus ulcer and chronic osteomyelitis: -Wound VAC in place -S/p diverting colostomy  -Per ID, surgery, PCCM, IM  6. Hypokalemia: -Improving   7. Acute hypoxic respiratory distress: -Status post extubation, weaned to nasal cannula   For questions or updates, please contact [place] Please consult [email or web address] for contact info under Cardiology/STEMI.    Signed, Christell Faith, PA-C [place] Pager: [phone] 07/05/2019, 10:08 AM

## 2019-07-06 DIAGNOSIS — I5043 Acute on chronic combined systolic (congestive) and diastolic (congestive) heart failure: Secondary | ICD-10-CM | POA: Diagnosis not present

## 2019-07-06 DIAGNOSIS — I4819 Other persistent atrial fibrillation: Secondary | ICD-10-CM | POA: Diagnosis not present

## 2019-07-06 LAB — GLUCOSE, CAPILLARY
Glucose-Capillary: 105 mg/dL — ABNORMAL HIGH (ref 70–99)
Glucose-Capillary: 106 mg/dL — ABNORMAL HIGH (ref 70–99)
Glucose-Capillary: 108 mg/dL — ABNORMAL HIGH (ref 70–99)
Glucose-Capillary: 162 mg/dL — ABNORMAL HIGH (ref 70–99)
Glucose-Capillary: 146 mg/dL — ABNORMAL HIGH (ref 70–99)

## 2019-07-06 LAB — BASIC METABOLIC PANEL
Anion gap: 7 (ref 5–15)
BUN: 27 mg/dL — ABNORMAL HIGH (ref 8–23)
CO2: 33 mmol/L — ABNORMAL HIGH (ref 22–32)
Calcium: 8.6 mg/dL — ABNORMAL LOW (ref 8.9–10.3)
Chloride: 103 mmol/L (ref 98–111)
Creatinine, Ser: 0.91 mg/dL (ref 0.61–1.24)
GFR calc Af Amer: >60 mL/min (ref >60)
GFR calc non Af Amer: >60 mL/min (ref >60)
Glucose, Bld: 116 mg/dL — ABNORMAL HIGH (ref 70–99)
Potassium: 3.5 mmol/L (ref 3.5–5.1)
Sodium: 143 mmol/L (ref 135–145)

## 2019-07-06 LAB — PHOSPHORUS: Phosphorus: 2.6 mg/dL (ref 2.5–4.6)

## 2019-07-06 LAB — MAGNESIUM: Magnesium: 2 mg/dL (ref 1.7–2.4)

## 2019-07-06 MED ORDER — POTASSIUM CHLORIDE CRYS ER 20 MEQ PO TBCR
40.0000 meq | EXTENDED_RELEASE_TABLET | Freq: Once | ORAL | Status: AC
  Administered 2019-07-06: 40 meq via ORAL
  Filled 2019-07-06: qty 2

## 2019-07-06 NOTE — Progress Notes (Signed)
Progress Note  Patient Name: Colton Lamb Date of Encounter: 07/06/2019  Primary Cardiologist: Ida Rogue, MD   Subjective   Patient seen sleeping this morning comfortably.  Denies any acute events overnight.  Inpatient Medications    Scheduled Meds: . aspirin EC  81 mg Oral Daily  . atorvastatin  80 mg Oral Daily  . Chlorhexidine Gluconate Cloth  6 each Topical Q0600  . feeding supplement (NEPRO CARB STEADY)  237 mL Oral TID BM  . finasteride  5 mg Oral Daily  . insulin aspart  0-9 Units Subcutaneous TID WC  . lactobacillus  1 g Oral TID WC  . midodrine  5 mg Oral TID WC  . multivitamin-lutein   Oral Daily  . niacin  1,000 mg Oral Daily  . pantoprazole  40 mg Oral Daily  . potassium chloride  20 mEq Oral Daily  . sodium chloride flush  10-40 mL Intracatheter Q12H  . tamsulosin  0.4 mg Oral QPC breakfast  . vitamin C  500 mg Oral BID   Continuous Infusions: . sodium chloride Stopped (06/29/19 1035)  . lactated ringers 50 mL/hr at 07/06/19 0400  . norepinephrine (LEVOPHED) 57m / 2545minfusion 5 mcg/min (07/05/19 0629)  . piperacillin-tazobactam (ZOSYN)  IV 12.5 mL/hr at 07/06/19 0600   PRN Meds: sodium chloride, acetaminophen, albuterol, ALPRAZolam, guaiFENesin-dextromethorphan, HYDROcodone-acetaminophen, ondansetron (ZOFRAN) IV, sodium chloride flush, zolpidem   Vital Signs    Vitals:   07/06/19 0700 07/06/19 0742 07/06/19 0800 07/06/19 0900  BP: 105/67  94/68 111/74  Pulse: 94 85 89 86  Resp: (!) 29 (!) 31 (!) 32 (!) 25  Temp:  97.7 F (36.5 C)    TempSrc:  Oral    SpO2: 100% 99%    Weight:      Height:        Intake/Output Summary (Last 24 hours) at 07/06/2019 1142 Last data filed at 07/06/2019 0743 Gross per 24 hour  Intake 1664.83 ml  Output 1300 ml  Net 364.83 ml   Last 3 Weights 07/03/2019 06/30/2019 06/30/2019  Weight (lbs) 238 lb (No Data) (No Data)  Weight (kg) 107.956 kg (No Data) (No Data)      Telemetry    Atrial fibrillation,  heart rate 90s- Personally Reviewed  ECG    Not obtained today  Physical Exam   GEN: No acute distress.   Neck: No JVD Cardiac:  Irregular irregular, no murmurs, rubs, or gallops.  Respiratory:  For inspiratory effort, decreased breath sounds at bases. GI: Soft, colostomy bag, will see him appears pink MS:  SCDs in place. Neuro:  Nonfocal  Psych: Normal affect   Labs    High Sensitivity Troponin:   Recent Labs  Lab 06/12/19 0100 06/24/19 1258  TROPONINIHS 34* 16      Chemistry Recent Labs  Lab 07/04/19 0438 07/05/19 0425 07/06/19 0421  NA 144 143 143  K 4.6 3.8 3.5  CL 103 103 103  CO2 31 33* 33*  GLUCOSE 180* 165* 116*  BUN 27* 30* 27*  CREATININE 1.17 1.17 0.91  CALCIUM 8.7* 8.7* 8.6*  GFRNONAA >60 >60 >60  GFRAA >60 >60 >60  ANIONGAP 10 7 7      Hematology Recent Labs  Lab 07/03/19 0514 07/04/19 0438 07/05/19 0425  WBC 9.7 14.1* 13.0*  RBC 3.76* 3.92* 3.77*  HGB 9.4* 9.9* 9.4*  HCT 31.8* 33.0* 33.2*  MCV 84.6 84.2 88.1  MCH 25.0* 25.3* 24.9*  MCHC 29.6* 30.0 28.3*  RDW 19.2* 18.6* 18.6*  PLT 262 317 278    BNPNo results for input(s): BNP, PROBNP in the last 168 hours.   DDimer No results for input(s): DDIMER in the last 168 hours.   Radiology    No results found.  Cardiac Studies   TTEcho 01/2019: 1. The left ventricle has moderately reduced systolic function, with an ejection fraction of 35-40%. The cavity size was normal. Left ventricular diastolic Doppler parameters are indeterminate. Probably severe hypokinesis of mid-distal anterior,  ateroseptal and apical myocardium. 2. The right ventricle has normal systolic function. The cavity was normal. There is no increase in right ventricular wall thickness. 3. Left atrial size was moderately dilated. 4. Very poor image quality in spite of using Definity. Valves not well visualized. Consider different diagnostic testing if indicated. 5. The aortic valve was not well visualized. Aortic  valve regurgitation was not assessed by color flow Doppler.  Patient Profile     68 y.o. male with history of CAD status post PCI to LAD and left circumflex/OM, heart failure reduced ejection fraction last ejection fraction 35 to 40%, persistent atrial fibrillation, atrial flutter status post ablation.  Assessment & Plan    1.  Heart failure reduced ejection fraction, EF 20 to 25% -Patient appears euvolemic -Holding heart failure meds in light of low normal blood pressure. -Midodrine being given to help with blood pressure -Plan to reinitiate as blood pressure permits.  2.  History of CAD with PCI's to LAD and left circumflex artery/OM -Continue aspirin 81 mg, Lipitor,   3.  Persistent atrial fibrillation -Heart rate is controlled -Resume anticoagulation when possible as per surgical team  4.  Sacral decubitus ulcer, osteomyelitis, history of Crohn's -Status post colectomy with colostomy bag. Management as per primary team and surgery team.  Greater than 50% was spent in counseling and coordination of care with patient Total encounter time 25 minutes or more    Signed, Kate Sable, MD  07/06/2019, 11:42 AM

## 2019-07-06 NOTE — Progress Notes (Addendum)
Pharmacy Electrolyte Monitoring Consult:  Pharmacy consulted to assist in monitoring and replacing electrolytes in this 68 y.o. male admitted on 06/12/2019 with Respiratory Distress  Labs:  Sodium (mmol/L)  Date Value  07/06/2019 143  05/09/2016 141  11/22/2013 133 (L)   Potassium (mmol/L)  Date Value  07/06/2019 3.5  11/22/2013 3.7   Magnesium (mg/dL)  Date Value  07/06/2019 2.0  11/22/2013 1.8   Phosphorus (mg/dL)  Date Value  07/06/2019 2.6   Calcium (mg/dL)  Date Value  07/06/2019 8.6 (L)   Calcium, Total (mg/dL)  Date Value  11/22/2013 9.0   Albumin (g/dL)  Date Value  06/12/2019 2.9 (L)  04/18/2018 4.3    Assessment/Plan: Lasix being held. Pt is on KCl 20 mEq daily.  Will give extra KCl 40 mEq PO x 1.    BMP in am.   Replace for goal potassium ~ 4 and goal magnesium ~2.   Pharmacy will continue to monitor and adjust per consult   Oswald Hillock, PharmD, BCPS 07/06/2019 8:38 AM

## 2019-07-06 NOTE — Progress Notes (Signed)
07/06/2019  Subjective: Patient is 3 Days Post-Op s/p creation of loop sigmoid colostomy.  No acute events overnight.  Patient denies any worsening abdominal pain.  Denies any bowel movement.  Able to come off pressors yesterday and his BP has remained stable.  Started on clears yesterday and is tolerating well.  Vital signs: Temp:  [97.6 F (36.4 C)-98 F (36.7 C)] 97.7 F (36.5 C) (12/05 0742) Pulse Rate:  [71-105] 86 (12/05 0900) Resp:  [17-32] 25 (12/05 0900) BP: (90-121)/(56-78) 111/74 (12/05 0900) SpO2:  [89 %-100 %] 99 % (12/05 0742)   Intake/Output: 12/04 0701 - 12/05 0700 In: 1664.8 [I.V.:1002.9; IV Piggyback:661.9] Out: 1225 [Urine:1225] Last BM Date: 07/03/19  Physical Exam: Constitutional: No acute distress Abdomen:  Soft, non-distended, with some soreness to palpation over the left lower quadrant area in the vicinity of the colostomy.  Colostomy not matured yet, with appliance in place with some serous drainage.    Labs:  Recent Labs    07/04/19 0438 07/05/19 0425  WBC 14.1* 13.0*  HGB 9.9* 9.4*  HCT 33.0* 33.2*  PLT 317 278   Recent Labs    07/05/19 0425 07/06/19 0421  NA 143 143  K 3.8 3.5  CL 103 103  CO2 33* 33*  GLUCOSE 165* 116*  BUN 30* 27*  CREATININE 1.17 0.91  CALCIUM 8.7* 8.6*   No results for input(s): LABPROT, INR in the last 72 hours.  Imaging: No results found.  Assessment/Plan: This is a 68 y.o. male s/p loop sigmoid colostomy  --Patient recovering well, off pressors now and stable. --Will advance to full liquid diet today. --Plans for ostomy maturation on Monday at bedside. --Ok to transfer to floor from surgical standpoint.   Melvyn Neth, [facility]

## 2019-07-06 NOTE — Progress Notes (Signed)
CRITICAL CARE CONSULTATION NOTE    Name: Colton Lamb MRN: [id number] DOB: 08/21/1950     LOS: 77   Referring physician: Dr. Gaylord Shih   SUBJECTIVE FINDINGS & SIGNIFICANT EVENTS   Patient description:   Colton Lamb  is a 68 y.o. obese Caucasian male with a known history of systolic CHF, coronary artery disease hypertension dyslipidemia and Crohn's disease, presented to the emergency room with acute onset of worsening dyspnea with associated dry cough and wheezing. He he admitted to orthopnea and paroxysmal nocturnal dyspnea as well as dyspnea on exertion with lower extremity edema.  No fever or chills.  No nausea or vomiting.  No dysuria, oliguria or hematuria or flank pain.  No recent exposure to COVID-19.  He was tested negative about a week prior to admission.  Upon presentation to the emergency room, his heart rate was 102 and respiratory to 34 with pulse oximetry of 85% on CPAP and later on 100% on BiPAP.  His BNP was 731 and high-sensitivity troponin I of 34.  CBC showed leukocytosis and anemia with neutrophilia and CMP showed a CO2 of 42 with a BUN of 24 and creatinine 0.98 alk phos of 160 with otherwise normal LFTs except for albumin 2.9.  Venous blood gas with a pH 7.38 and bicarbonate of 50.9.  COVID-19 negative  Portable chest ray showed vascular congestion and small pleural effusions with bibasal atelectasis that are new compared to prior exam.  His AICD was in place.  EKG showed atrial fibrillation with a rapid rate of 110 with left intravesicular block and prolonged QT interval with QTC of 568 MS. patient had evaluation by surgery for stage IV sacral decubitus with osteomyelitis.  Patient underwent transverse loop colostomy and was postoperatively brought back to the PACU however required reintubation  due to increased oxygen requirement and hypoxemia.  Subsequently patient was stabilized and was ultimately extubated however was noted to be hypotensive and required vasopressor infusion for blood pressure support, critical care consultation was placed by anesthesia team for further management.  Lines / Drains: PIV x 4  Cultures / Sepsis markers: n/a  Antibiotics: zosyn   Protocols / Consultants: Surgery & hospitalist service  12/3 - placed on BIPAP due to significant hypercapnia, this am improved mentation, repeat ABG reviewed. Wife at bedside reviewed hospital course and answered questions.  BiPAP adjsuted with increased driving pressure currently on 20/5 12/4- remains on levophed 20mg, weaning, mentation improved AAxself and place, diet advanced to clears 12/5-weaned off of vasopressor support , will optimize for downgrade   PAST MEDICAL HISTORY   Past Medical History:  Diagnosis Date   [place]rial flutter ([place]    a. s/p Cardioversion 11/22/13, on amiodarone and Xarelto.   CHF (congestive heart failure) (HCC)    Chronic osteomyelitis ([place] 191/79/1505  Chronic systolic heart failure ([place]    a. 10/2013 EF 20-25%, grade III DD, RV mildly dilated and sys fx mild/mod reduced;  b. 01/2014 Echo: EF 30-35%, gr3 DD, mod dil LA.   Coronary artery disease    a. s/p MI 2007/2015;  b. s/p prior PCI to the LAD/LCX/PDA/PL;  c. 2008: s/p Cypher DES to the OM.   Crohn's ileocolitis ([place]    GERD (gastroesophageal reflux disease)    Hx of adenomatous colonic polyps 11/2003   Hyperlipidemia    Hypertension    Ischemic cardiomyopathy    s. 01/2014 s/p MDT [id number] EGwyneth RevelsXT DR single lead AICD.   Obesity    Paroxysmal  atrial fibrillation (HCC)    a. CHA2DS2VASc = 4-->xarelto/amio.   Sleep apnea    Syncope    a.  11/2013 in setting of volume depletion and bradycardia due to dig toxicity    Type II diabetes mellitus ([place])      SURGICAL HISTORY   Past Surgical History:    Procedure Laterality Date   ATRIAL FLUTTER ABLATION N/A 04/16/2014   Procedure: ATRIAL FLUTTER ABLATION;  Surgeon: Evans Lance, MD;  Location: [facility] CATH LAB;  Service: Cardiovascular;  Laterality: N/A;   CARDIAC CATHETERIZATION  10/2013   CARDIAC DEFIBRILLATOR PLACEMENT  04/16/2014   Medtronic Evira device   CARDIAC ELECTROPHYSIOLOGY STUDY AND ABLATION  04/16/2014   atrial flutter ablation   CARDIOVERSION N/A 03/05/2014   Procedure: CARDIOVERSION;  Surgeon: Jolaine Artist, MD;  Location: [name];  Service: Cardiovascular;  Laterality: N/A;   CATARACT EXTRACTION W/PHACO Right 01/04/2017   Procedure: CATARACT EXTRACTION PHACO AND INTRAOCULAR LENS PLACEMENT ([place])  Right Diabetic Complicated;  Surgeon: Leandrew Koyanagi, MD;  Location: [place];  Service: Ophthalmology;  Laterality: Right;  Diabetic   CATARACT EXTRACTION W/PHACO Left 02/08/2017   Procedure: CATARACT EXTRACTION PHACO AND INTRAOCULAR LENS PLACEMENT ([facility]) left diabetic;  Surgeon: Leandrew Koyanagi, MD;  Location: [place];  Service: Ophthalmology;  Laterality: Left;  Diabetic - oral meds sleep apnea   CORONARY ANGIOPLASTY WITH STENT PLACEMENT  2007; 2008 X 2   "1+1 ~ 1"   FOOT SURGERY Left    bone spur   HYDROCELE EXCISION Bilateral    Ileocecal resection and sigmoid enterocolonic fistula repair  09/1998   IMPLANTABLE CARDIOVERTER DEFIBRILLATOR IMPLANT N/A 04/16/2014   Procedure: IMPLANTABLE CARDIOVERTER DEFIBRILLATOR IMPLANT;  Surgeon: Evans Lance, MD;  Location: [facility] CATH LAB;  Service: Cardiovascular;  Laterality: N/A;   LEFT HEART CATHETERIZATION WITH CORONARY ANGIOGRAM N/A 11/22/2013   Procedure: LEFT HEART CATHETERIZATION WITH CORONARY ANGIOGRAM;  Surgeon: Sinclair Grooms, MD;  Location: [facility] CATH LAB;  Service: Cardiovascular;  Laterality: N/A;   TRANSVERSE LOOP COLOSTOMY N/A 07/03/2019   Procedure: TRANSVERSE LOOP COLOSTOMY;  Surgeon: Ronny Bacon, MD;  Location: [facility] ORS;   Service: General;  Laterality: N/A;     FAMILY HISTORY   Family History  Problem Relation Age of Onset   Breast cancer Mother    Heart disease Father    Heart attack Father    Colon cancer Neg Hx      SOCIAL HISTORY   Social History   Tobacco Use   Smoking status: Never Smoker   Smokeless tobacco: Never Used  Substance Use Topics   Alcohol use: No   Drug use: No     MEDICATIONS   Current Medication:  Current Facility-Administered Medications:    0.9 %  sodium chloride infusion, , Intravenous, PRN, Ronny Bacon, MD, Stopped at 06/29/19 1035   acetaminophen (TYLENOL) tablet 650 mg, 650 mg, Oral, Q4H PRN, Ronny Bacon, MD   albuterol (VENTOLIN HFA) 108 (90 Base) MCG/ACT inhaler 1 puff, 1 puff, Inhalation, Q6H PRN, Ronny Bacon, MD   ALPRAZolam Duanne Moron) tablet 0.25 mg, 0.25 mg, Oral, BID PRN, Ronny Bacon, MD   aspirin EC tablet 81 mg, 81 mg, Oral, Daily, Ronny Bacon, MD, 81 mg at 07/06/19 1104   atorvastatin (LIPITOR) tablet 80 mg, 80 mg, Oral, Daily, Ronny Bacon, MD, 80 mg at 07/05/19 1732   Chlorhexidine Gluconate Cloth 2 % PADS 6 each, 6 each, Topical, Q0600, Ronny Bacon, MD, 6 each at 07/05/19 1737   feeding supplement (  NEPRO CARB STEADY) liquid 237 mL, 237 mL, Oral, TID BM, Ronny Bacon, MD, 237 mL at 07/06/19 1418   finasteride (PROSCAR) tablet 5 mg, 5 mg, Oral, Daily, Ronny Bacon, MD, 5 mg at 07/06/19 1103   guaiFENesin-dextromethorphan (ROBITUSSIN DM) 100-10 MG/5ML syrup 10 mL, 10 mL, Oral, Q4H PRN, Ronny Bacon, MD   HYDROcodone-acetaminophen (NORCO/VICODIN) 5-325 MG per tablet 1 tablet, 1 tablet, Oral, Q4H PRN, Ronny Bacon, MD   insulin aspart (novoLOG) injection 0-9 Units, 0-9 Units, Subcutaneous, TID WC, Ronny Bacon, MD, 1 Units at 07/05/19 1733   lactobacillus (FLORANEX/LACTINEX) granules 1 g, 1 g, Oral, TID WC, Ronny Bacon, MD, 1 g at 07/06/19 1104   midodrine (PROAMATINE) tablet 5  mg, 5 mg, Oral, TID WC, Minna Merritts, MD, 5 mg at 07/06/19 1103   multivitamin-lutein (OCUVITE-LUTEIN) capsule, , Oral, Daily, Ronny Bacon, MD, 1 capsule at 07/06/19 1104   niacin (NIASPAN) CR tablet 1,000 mg, 1,000 mg, Oral, Daily, Ronny Bacon, MD, 1,000 mg at 07/06/19 1103   norepinephrine (LEVOPHED) 4 mg in sodium chloride 0.9 % 250 mL (0.016 mg/mL) infusion, 0-40 mcg/min, Intravenous, Titrated, [place], Amy, MD, Last Rate: 18.75 mL/hr at 07/05/19 0629, 5 mcg/min at 07/05/19 0629   ondansetron (ZOFRAN) injection 4 mg, 4 mg, Intravenous, Q6H PRN, Ronny Bacon, MD, 4 mg at 07/03/19 1132   pantoprazole (PROTONIX) EC tablet 40 mg, 40 mg, Oral, Daily, Ronny Bacon, MD, 40 mg at 07/06/19 1103   piperacillin-tazobactam (ZOSYN) IVPB 3.375 g, 3.375 g, Intravenous, Q8H, Ronny Bacon, MD, Last Rate: 12.5 mL/hr at 07/06/19 1419, 3.375 g at 07/06/19 1419   potassium chloride SA (KLOR-CON) CR tablet 20 mEq, 20 mEq, Oral, Daily, Ronny Bacon, MD, 20 mEq at 07/06/19 1103   sodium chloride flush (NS) 0.9 % injection 10-40 mL, 10-40 mL, Intracatheter, Q12H, Ronny Bacon, MD, 10 mL at 07/06/19 1105   sodium chloride flush (NS) 0.9 % injection 10-40 mL, 10-40 mL, Intracatheter, PRN, Ronny Bacon, MD, 10 mL at 06/30/19 1800   tamsulosin (FLOMAX) capsule 0.4 mg, 0.4 mg, Oral, QPC breakfast, Ronny Bacon, MD, 0.4 mg at 07/06/19 0740   vitamin C (ASCORBIC ACID) tablet 500 mg, 500 mg, Oral, BID, Ronny Bacon, MD, 500 mg at 07/06/19 0740   zolpidem (AMBIEN) tablet 5 mg, 5 mg, Oral, QHS PRN, Ronny Bacon, MD    ALLERGIES   Iodine, Shrimp [shellfish allergy], and Tetracycline    REVIEW OF SYSTEMS     Unable to obtain due to poorly responsive state.   PHYSICAL EXAMINATION   Vital Signs: Temp:  [97.6 F (36.4 C)-98.1 F (36.7 C)] 98.1 F (36.7 C) (12/05 1400) Pulse Rate:  [71-98] 91 (12/05 1400) Resp:  [17-33] 30 (12/05 1400) BP:  (90-121)/(56-80) 107/70 (12/05 1400) SpO2:  [89 %-100 %] 98 % (12/05 1400)  GENERAL:NAD HEAD: Normocephalic, atraumatic.  EYES: Pupils equal, round, reactive to light.  No scleral icterus.  MOUTH: Moist mucosal membrane. NECK: Supple. No thyromegaly. No nodules. No JVD.  PULMONARY: CTAB CARDIOVASCULAR: S1 and S2. Regular rate and rhythm. No murmurs, rubs, or gallops.  GASTROINTESTINAL: Soft, nontender, non-distended. No masses. Positive bowel sounds. No hepatosplenomegaly.  MUSCULOSKELETAL: No swelling, clubbing, or edema.  NEUROLOGIC: Mild distress due to acute illness SKIN:intact,warm,dry   PERTINENT DATA     Infusions:  sodium chloride Stopped (06/29/19 1035)   norepinephrine (LEVOPHED) 58m / 2550minfusion 5 mcg/min (07/05/19 0629)   piperacillin-tazobactam (ZOSYN)  IV 3.375 g (07/06/19 1419)   Scheduled Medications:  aspirin EC  81 mg  Oral Daily   atorvastatin  80 mg Oral Daily   Chlorhexidine Gluconate Cloth  6 each Topical Q0600   feeding supplement (NEPRO CARB STEADY)  237 mL Oral TID BM   finasteride  5 mg Oral Daily   insulin aspart  0-9 Units Subcutaneous TID WC   lactobacillus  1 g Oral TID WC   midodrine  5 mg Oral TID WC   multivitamin-lutein   Oral Daily   niacin  1,000 mg Oral Daily   pantoprazole  40 mg Oral Daily   potassium chloride  20 mEq Oral Daily   sodium chloride flush  10-40 mL Intracatheter Q12H   tamsulosin  0.4 mg Oral QPC breakfast   vitamin C  500 mg Oral BID   PRN Medications: sodium chloride, acetaminophen, albuterol, ALPRAZolam, guaiFENesin-dextromethorphan, HYDROcodone-acetaminophen, ondansetron (ZOFRAN) IV, sodium chloride flush, zolpidem Hemodynamic parameters:   Intake/Output: 12/04 0701 - 12/05 0700 In: 1664.8 [I.V.:1002.9; IV Piggyback:661.9] Out: 6767 [Urine:1225]  Ventilator  Settings:      LAB RESULTS:  Basic Metabolic Panel: Recent Labs  Lab 06/30/19 0523  07/02/19 0457 07/03/19 0514  07/04/19 0438 07/05/19 0425 07/06/19 0421  NA 140   < > 143 144 144 143 143  K 3.6   < > 3.5 3.4* 4.6 3.8 3.5  CL 98   < > 101 99 103 103 103  CO2 28   < > 30 32 31 33* 33*  GLUCOSE 140*   < > 113* 109* 180* 165* 116*  BUN 18   < > 18 19 27* 30* 27*  CREATININE 0.83   < > 0.66 0.93 1.17 1.17 0.91  CALCIUM 8.6*   < > 8.9 8.8* 8.7* 8.7* 8.6*  MG 1.9  --  2.1  --  2.3  --  2.0  PHOS  --   --   --   --   --   --  2.6   < > = values in this interval not displayed.   Liver Function Tests: No results for input(s): AST, ALT, ALKPHOS, BILITOT, PROT, ALBUMIN in the last 168 hours. No results for input(s): LIPASE, AMYLASE in the last 168 hours. No results for input(s): AMMONIA in the last 168 hours. CBC: Recent Labs  Lab 07/01/19 0450 07/02/19 0457 07/03/19 0514 07/04/19 0438 07/05/19 0425  WBC 11.2* 9.5 9.7 14.1* 13.0*  NEUTROABS 8.8* 7.2 7.2 12.9* 10.7*  HGB 9.4* 9.4* 9.4* 9.9* 9.4*  HCT 30.3* 31.8* 31.8* 33.0* 33.2*  MCV 81.5 83.9 84.6 84.2 88.1  PLT 284 280 262 317 278   Cardiac Enzymes: No results for input(s): CKTOTAL, CKMB, CKMBINDEX, TROPONINI in the last 168 hours. BNP: Invalid input(s): POCBNP CBG: Recent Labs  Lab 07/05/19 1618 07/05/19 2153 07/06/19 0326 07/06/19 0836 07/06/19 1203  GLUCAP 135* 127* 105* 106* 108*     IMAGING RESULTS:    ASSESSMENT AND PLAN    -Multidisciplinary rounds held today  Post-operative circulatory shock -weaned off of levophed drip for vasopressor support -related to anesthesia with background hx of OSA and CHF -patient is lucid with mental status at baseline now   Hypercapnic respiratory failure -S/p NIV - currenlty on BIPAP PRN QHS  -ABG reviewed   Chronic systolic CHF with EF 20% S/p PPM/AICD -oxygen as needed -Lasix as tolerated ICU monitoring -cardiology on case - appreciate input  Stage 4 decubitus sacral ulcer  - surgery on case - appreciate reccommendations  ID -continue IV abx as prescibed -follow  up cultures  GI/Nutrition GI  PROPHYLAXIS as indicated DIET-->TF's as tolerated Constipation protocol as indicated  ENDO - ICU hypoglycemic\Hyperglycemia protocol -check FSBS per protocol   ELECTROLYTES -follow labs as needed -replace as needed -pharmacy consultation   DVT/GI PRX ordered -SCDs  TRANSFUSIONS AS NEEDED MONITOR FSBS ASSESS the need for LABS as needed   Critical care provider statement:    Critical care time (minutes):  32   Critical care time was exclusive of:  Separately billable procedures and treating other patients   Critical care was necessary to treat or prevent imminent or life-threatening deterioration of the following conditions:  circulatory shock, respiratory failure, chf, osa, sacral decubitus   Critical care was time spent personally by me on the following activities:  Development of treatment plan with patient or surrogate, discussions with consultants, evaluation of patient's response to treatment, examination of patient, obtaining history from patient or surrogate, ordering and performing treatments and interventions, ordering and review of laboratory studies and re-evaluation of patient's condition.  I assumed direction of critical care for this patient from another provider in my specialty: no    This document was prepared using Dragon voice recognition software and may include unintentional dictation errors.    Ottie Glazier, M.D.  Division of [place]

## 2019-07-06 NOTE — Progress Notes (Signed)
[place] at [place] NAME: Colton Lamb    MR#:  [id number]  DATE OF BIRTH:  12-Mar-1951  SUBJECTIVE:  no new complaints. Overall having a good day tolerating clear liquid diet  wound VAC was changed by wound RN REVIEW OF SYSTEMS:   Review of Systems  Constitutional: Negative for chills, fever and weight loss.  HENT: Negative for ear discharge, ear pain and nosebleeds.   Eyes: Negative for blurred vision, pain and discharge.  Respiratory: Positive for shortness of breath. Negative for sputum production, wheezing and stridor.   Cardiovascular: Negative for chest pain, palpitations, orthopnea and PND.  Gastrointestinal: Negative for abdominal pain, diarrhea, nausea and vomiting.  Genitourinary: Negative for frequency and urgency.  Musculoskeletal: Negative for back pain and joint pain.  Neurological: Positive for weakness. Negative for sensory change, speech change and focal weakness.  Psychiatric/Behavioral: Negative for depression and hallucinations. The patient is not nervous/anxious.     DRUG ALLERGIES:   Allergies  Allergen Reactions  . Iodine Other (See Comments)    Shortness of breath, swelling and hives  . Shrimp [Shellfish Allergy] Other (See Comments)    SWELLING    HIVES    SHORTNESS OF BREATH  . Tetracycline Rash    VITALS:  Blood pressure 107/70, pulse 91, temperature 98.1 F (36.7 C), temperature source Axillary, resp. rate (!) 30, height 6' (1.829 m), weight 108 kg, SpO2 98 %.  PHYSICAL EXAMINATION:   Physical Exam  GENERAL:  68 y.o.-year-old patient lying in the bed with mild acute distress. Chronically ill and on [place] oxygen EYES: Pupils equal, round, reactive to light and accommodation. No scleral icterus.  HEENT: Head atraumatic, normocephalic. Oropharynx and nasopharynx clear.  NECK:  Supple, no jugular venous distention. No thyroid enlargement, no tenderness.  LUNGS: Normal breath sounds bilaterally, no  wheezing, rales, rhonchi. No use of accessory muscles of respiration.  CARDIOVASCULAR: S1, S2 normal. No murmurs, rubs, or gallops.  ABDOMEN: Soft, nontender, nondistended. Bowel sounds present. No organomegaly or mass. Diverting loop  Colostomy+, Foley+ EXTREMITIES:++ edema b/l --chronic NEUROLOGIC: Cranial nerves II through XII are intact. No focal Motor or sensory deficits b/l.   PSYCHIATRIC:  patient is alert and oriented x 3.  SKIN: wound VAC on the buttocks  LABORATORY PANEL:  CBC Recent Labs  Lab 07/05/19 0425  WBC 13.0*  HGB 9.4*  HCT 33.2*  PLT 278    Chemistries  Recent Labs  Lab 07/06/19 0421  NA 143  K 3.5  CL 103  CO2 33*  GLUCOSE 116*  BUN 27*  CREATININE 0.91  CALCIUM 8.6*  MG 2.0   Cardiac Enzymes No results for input(s): TROPONINI in the last 168 hours. RADIOLOGY:  No results found. ASSESSMENT AND PLAN:  68 year old man PMH systolic CHF, Crohn's disease presented with shortness of breath.  Treated with BiPAP, Lasix and admitted for acute CHF exacerbation.  1. Acute on chronic diastolic/systolic congestive heart failure with severe cardiomyopathy EF of 20 to 25% -appears you will awake.  -Continue oral Lasix digoxin spironolactone and Toprol-XL -ACE inhibitor/ARB you too low blood pressure -cardiology recommends right and left heart catheterization however this is deferred until volume status is optimized and sacral decubitus is healed [facility] cardiology following pt -patient currently off BIPAP--on 3 liter  -wean off levophed if MAP>65. Patient usually runs low blood pressure  2. 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 Christian Mate -on Zosyn (end date 07/24/2019 per ID) -Foley catheter to be placed to avoid soiling of his Chronic decubitus and pt has h/o Urinary rentention. Patient had Foley before as  well. -discussed with Dr. Hampton Abbot-- hold off on Xarelto till Dr. Lutricia Feil completes the colostomy procedure on Monday  3. Hypotension postop with cardiomyopathy--BP improving. MAP today 75 -on low-dose IV pressers--wean off -goal blood pressure 90/50  As long as symptoms of hypotension do not develop. -Patient's blood pressure stays usually on the lower side  4. History of chronic atrial fibrillation -rate controlled -continue digoxin Toprol-XL -holding Xarelto  5. Diabetes type II, controlled -continue levemir sliding scale insulin -sugars appears stable  6. DVT prophylaxis SQ heparin.  7. Code status-- DNR. Patient and family  met with palliative care and goals of care were discussed.   Family communication : sister-in-law in the room Consults : surgery, ID Discharge Disposition : to be determined CODE STATUS: DNR   TOTAL TIME TAKING CARE OF THIS PATIENT: *68* minutes.  >50% time spent on counselling and coordination of care  POSSIBLE D/C IN *few* DAYS, DEPENDING ON CLINICAL CONDITION.  Note: This dictation was prepared with Dragon dictation along with smaller phrase technology. Any transcriptional errors that result from this process are unintentional.  [name] M.D on 07/06/2019 at 2:30 PM  Between 7am to 6pm - Pager - [phone]  After 6pm go to [email or web address]  [facility]   CC: Primary care physician; [name], MDPatient ID: Colton Lamb, male   DOB: 03/29/1951, 68 y.o.   MRN: [id number]

## 2019-07-07 DIAGNOSIS — I5043 Acute on chronic combined systolic (congestive) and diastolic (congestive) heart failure: Secondary | ICD-10-CM | POA: Diagnosis not present

## 2019-07-07 LAB — BASIC METABOLIC PANEL
Anion gap: 6 (ref 5–15)
BUN: 20 mg/dL (ref 8–23)
CO2: 34 mmol/L — ABNORMAL HIGH (ref 22–32)
Calcium: 8.7 mg/dL — ABNORMAL LOW (ref 8.9–10.3)
Chloride: 102 mmol/L (ref 98–111)
Creatinine, Ser: 0.73 mg/dL (ref 0.61–1.24)
GFR calc Af Amer: >60 mL/min (ref >60)
GFR calc non Af Amer: >60 mL/min (ref >60)
Glucose, Bld: 131 mg/dL — ABNORMAL HIGH (ref 70–99)
Potassium: 3.8 mmol/L (ref 3.5–5.1)
Sodium: 142 mmol/L (ref 135–145)

## 2019-07-07 LAB — CBC
HCT: 32.7 % — ABNORMAL LOW (ref 39.0–52.0)
Hemoglobin: 9.5 g/dL — ABNORMAL LOW (ref 13.0–17.0)
MCH: 25 pg — ABNORMAL LOW (ref 26.0–34.0)
MCHC: 29.1 g/dL — ABNORMAL LOW (ref 30.0–36.0)
MCV: 86.1 fL (ref 80.0–100.0)
Platelets: 213 10*3/uL (ref 150–400)
RBC: 3.8 MIL/uL — ABNORMAL LOW (ref 4.22–5.81)
RDW: 18.6 % — ABNORMAL HIGH (ref 11.5–15.5)
WBC: 11.5 10*3/uL — ABNORMAL HIGH (ref 4.0–10.5)
nRBC: 0 % (ref 0.0–0.2)

## 2019-07-07 LAB — GLUCOSE, CAPILLARY
Glucose-Capillary: 109 mg/dL — ABNORMAL HIGH (ref 70–99)
Glucose-Capillary: 115 mg/dL — ABNORMAL HIGH (ref 70–99)
Glucose-Capillary: 129 mg/dL — ABNORMAL HIGH (ref 70–99)
Glucose-Capillary: 138 mg/dL — ABNORMAL HIGH (ref 70–99)

## 2019-07-07 MED ORDER — POTASSIUM CHLORIDE CRYS ER 20 MEQ PO TBCR
20.0000 meq | EXTENDED_RELEASE_TABLET | Freq: Once | ORAL | Status: AC
  Administered 2019-07-07: 20 meq via ORAL
  Filled 2019-07-07: qty 1

## 2019-07-07 NOTE — Progress Notes (Signed)
07/07/2019  Subjective: Patient is 4 Days Post-Op s/p creation of loop sigmoid colostomy.  No acute events overnight.  Patient denies any worsening abdominal pain.  Denies any bowel movement.  He was transferred from ICU to floor yesterday.  Vital signs: Temp:  [97.5 F (36.4 C)-98.1 F (36.7 C)] 97.7 F (36.5 C) (12/06 0916) Pulse Rate:  [84-96] 86 (12/06 0916) Resp:  [15-33] 18 (12/06 0916) BP: (100-115)/(63-80) 107/66 (12/06 0916) SpO2:  [98 %-100 %] 100 % (12/06 2542)   Intake/Output: 12/05 0701 - 12/06 0700 In: 502.9 [I.V.:335.1; IV Piggyback:167.8] Out: 1450 [Urine:1300; Drains:75] Last BM Date: 07/03/19  Physical Exam: Constitutional: No acute distress Abdomen:  Soft, non-distended, with some soreness around the ostomy site.  Colostomy not matured yet, with some serous drainage in ostomy bag.  Colon wall without any necrosis and is pink and viable.  Labs:  Recent Labs    07/05/19 0425 07/07/19 0441  WBC 13.0* 11.5*  HGB 9.4* 9.5*  HCT 33.2* 32.7*  PLT 278 213   Recent Labs    07/06/19 0421 07/07/19 0441  NA 143 142  K 3.5 3.8  CL 103 102  CO2 33* 34*  GLUCOSE 116* 131*  BUN 27* 20  CREATININE 0.91 0.73  CALCIUM 8.6* 8.7*   No results for input(s): LABPROT, INR in the last 72 hours.  Imaging: No results found.  Assessment/Plan: This is a 68 y.o. male s/p loop sigmoid colostomy  --Discussed with Dr. Christian Mate and he plans for maturation of the stoma tomorrow at bedside.   --Will continue full liquid diet today, does not need to be NPO for procedure. --Continue holding Xarelto until after procedure.   Melvyn Neth, [facility]

## 2019-07-07 NOTE — Progress Notes (Signed)
Pharmacy Electrolyte Monitoring Consult:  Pharmacy consulted to assist in monitoring and replacing electrolytes in this 67 y.o. male admitted on 06/12/2019 with Respiratory Distress  Labs:  Sodium (mmol/L)  Date Value  07/07/2019 142  05/09/2016 141  11/22/2013 133 (L)   Potassium (mmol/L)  Date Value  07/07/2019 3.8  11/22/2013 3.7   Magnesium (mg/dL)  Date Value  07/06/2019 2.0  11/22/2013 1.8   Phosphorus (mg/dL)  Date Value  07/06/2019 2.6   Calcium (mg/dL)  Date Value  07/07/2019 8.7 (L)   Calcium, Total (mg/dL)  Date Value  11/22/2013 9.0   Albumin (g/dL)  Date Value  06/12/2019 2.9 (L)  04/18/2018 4.3    Assessment/Plan: Lasix being held. Pt is on KCl 20 mEq daily. Will give an extra 20 mEq x 1 today.   BMP in am.   Replace for goal potassium ~ 4 and goal magnesium ~2.   Pharmacy will continue to monitor and adjust per consult   Oswald Hillock, PharmD, BCPS 07/07/2019 7:51 AM

## 2019-07-07 NOTE — Progress Notes (Signed)
[place] at [place] NAME: Colton Lamb    MR#:  [id number]  DATE OF BIRTH:  10/07/1950  SUBJECTIVE:  no new complaints. Overall having a good day tolerating full  liquid diet  wound VAC was changed by wound RN on friday REVIEW OF SYSTEMS:   Review of Systems  Constitutional: Negative for chills, fever and weight loss.  HENT: Negative for ear discharge, ear pain and nosebleeds.   Eyes: Negative for blurred vision, pain and discharge.  Respiratory: Positive for shortness of breath. Negative for sputum production, wheezing and stridor.   Cardiovascular: Negative for chest pain, palpitations, orthopnea and PND.  Gastrointestinal: Negative for abdominal pain, diarrhea, nausea and vomiting.  Genitourinary: Negative for frequency and urgency.  Musculoskeletal: Negative for back pain and joint pain.  Neurological: Positive for weakness. Negative for sensory change, speech change and focal weakness.  Psychiatric/Behavioral: Negative for depression and hallucinations. The patient is not nervous/anxious.     DRUG ALLERGIES:   Allergies  Allergen Reactions  . Iodine Other (See Comments)    Shortness of breath, swelling and hives  . Shrimp [Shellfish Allergy] Other (See Comments)    SWELLING    HIVES    SHORTNESS OF BREATH  . Tetracycline Rash    VITALS:  Blood pressure 107/66, pulse 86, temperature 97.7 F (36.5 C), temperature source Oral, resp. rate 18, height 6' (1.829 m), weight 108 kg, SpO2 100 %.  PHYSICAL EXAMINATION:   Physical Exam  GENERAL:  69 y.o.-year-old patient lying in the bed with mild acute distress. Chronically ill and on [place] oxygen EYES: Pupils equal, round, reactive to light and accommodation. No scleral icterus.  HEENT: Head atraumatic, normocephalic. Oropharynx and nasopharynx clear.  NECK:  Supple, no jugular venous distention. No thyroid enlargement, no tenderness.  LUNGS: Normal breath sounds bilaterally, no  wheezing, rales, rhonchi. No use of accessory muscles of respiration.  CARDIOVASCULAR: S1, S2 normal. No murmurs, rubs, or gallops.  ABDOMEN: Soft, nontender, nondistended. Bowel sounds present. No organomegaly or mass. Diverting loop  Colostomy+, Foley+ EXTREMITIES:++ edema b/l --chronic NEUROLOGIC: Cranial nerves II through XII are intact. No focal Motor or sensory deficits b/l.   PSYCHIATRIC:  patient is alert and oriented x 3.  SKIN: wound VAC on the buttocks  LABORATORY PANEL:  CBC Recent Labs  Lab 07/07/19 0441  WBC 11.5*  HGB 9.5*  HCT 32.7*  PLT 213    Chemistries  Recent Labs  Lab 07/06/19 0421 07/07/19 0441  NA 143 142  K 3.5 3.8  CL 103 102  CO2 33* 34*  GLUCOSE 116* 131*  BUN 27* 20  CREATININE 0.91 0.73  CALCIUM 8.6* 8.7*  MG 2.0  --    Cardiac Enzymes No results for input(s): TROPONINI in the last 168 hours. RADIOLOGY:  No results found. ASSESSMENT AND PLAN:  68 year old man PMH systolic CHF, Crohn's disease presented with shortness of breath.  Treated with BiPAP, Lasix and admitted for acute CHF exacerbation.  1. Acute on chronic diastolic/systolic congestive heart failure with severe cardiomyopathy EF of 20 to 25% -appears you will awake.  -home meds held by cardiology Lasix digoxin spironolactone and Toprol-XL -cardiology recommends right and left heart catheterization however this is deferred until volume status is optimized and sacral decubitus is healed [facility] cardiology following pt -patient currently off BIPAP--on 3 liter [place] -weaned off levophed sinceMAP>65. Patient usually runs low blood pressure  2. 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 Christian Mate -on Zosyn (end date 07/24/2019 per ID) -Foley catheter to be placed to avoid soiling of his Chronic decubitus and pt has h/o Urinary rentention.  Patient had Foley before as well. -discussed with Dr. Hampton Abbot-- hold off on Xarelto till Dr. Lutricia Feil completes the colostomy procedure on Monday  3. Hypotension postop with cardiomyopathy--BP improving. MAP today 75 -on low-dose IV pressers--wean off -goal blood pressure 90/50  As long as symptoms of hypotension do not develop. -Patient's blood pressure stays usually on the lower side  4. History of chronic atrial fibrillation -rate controlled -on hold digoxin Toprol-XL -holding Xarelto  5. Diabetes type II, controlled -continue levemir sliding scale insulin -sugars appears stable  6. DVT prophylaxis SQ heparin.  7. Code status-- DNR. Patient and family  met with palliative care and goals of care were discussed.   Family communication : sister-in-law in the room Consults : surgery, ID Discharge Disposition : to be determined CODE STATUS: DNR   TOTAL TIME TAKING CARE OF THIS PATIENT: *25* minutes.  >50% time spent on counselling and coordination of care  POSSIBLE D/C IN *1-2* DAYS, DEPENDING ON CLINICAL CONDITION.  Note: This dictation was prepared with Dragon dictation along with smaller phrase technology. Any transcriptional errors that result from this process are unintentional.  [name] M.D on 07/07/2019 at 1:44 PM  Between 7am to 6pm - Pager - [phone]  After 6pm go to [email or web address]  [facility]   CC: Primary care physician; [name], MDPatient ID: Colton Lamb, male   DOB: 1950/08/03, 68 y.o.   MRN: [id number]

## 2019-07-07 NOTE — Progress Notes (Signed)
Progress Note  Patient Name: Colton Lamb Date of Encounter: 07/07/2019  Primary Cardiologist: Ida Rogue, MD   Subjective   No acute events overnight.  Inpatient Medications    Scheduled Meds: . aspirin EC  81 mg Oral Daily  . atorvastatin  80 mg Oral Daily  . Chlorhexidine Gluconate Cloth  6 each Topical Q0600  . feeding supplement (NEPRO CARB STEADY)  237 mL Oral TID BM  . finasteride  5 mg Oral Daily  . insulin aspart  0-9 Units Subcutaneous TID WC  . lactobacillus  1 g Oral TID WC  . midodrine  5 mg Oral TID WC  . multivitamin-lutein   Oral Daily  . niacin  1,000 mg Oral Daily  . pantoprazole  40 mg Oral Daily  . potassium chloride  20 mEq Oral Daily  . sodium chloride flush  10-40 mL Intracatheter Q12H  . tamsulosin  0.4 mg Oral QPC breakfast  . vitamin C  500 mg Oral BID   Continuous Infusions: . sodium chloride Stopped (07/07/19 0448)  . piperacillin-tazobactam (ZOSYN)  IV 3.375 g (07/07/19 0448)   PRN Meds: sodium chloride, acetaminophen, albuterol, ALPRAZolam, guaiFENesin-dextromethorphan, HYDROcodone-acetaminophen, ondansetron (ZOFRAN) IV, sodium chloride flush, zolpidem   Vital Signs    Vitals:   07/06/19 1500 07/06/19 1653 07/06/19 2305 07/07/19 0916  BP: 115/71 110/76 109/70 107/66  Pulse: 90 96 90 86  Resp: 20 15 16 18   Temp:  97.9 F (36.6 C) (!) 97.5 F (36.4 C) 97.7 F (36.5 C)  TempSrc:  Oral Oral Oral  SpO2:  100% 100% 100%  Weight:      Height:        Intake/Output Summary (Last 24 hours) at 07/07/2019 1128 Last data filed at 07/07/2019 0900 Gross per 24 hour  Intake 622.94 ml  Output 1375 ml  Net -752.06 ml   Last 3 Weights 07/03/2019 06/30/2019 06/30/2019  Weight (lbs) 238 lb (No Data) (No Data)  Weight (kg) 107.956 kg (No Data) (No Data)      Telemetry    Not on telemetry  ECG    Not obtained today  Physical Exam   GEN: No acute distress.   Neck: No JVD Cardiac:  Irregular irregular, no murmurs, rubs, or  gallops.  Respiratory:  For inspiratory effort, decreased breath sounds at bases. GI: Soft, colostomy bag, will see him appears pink MS:  SCDs in place. Neuro:  Nonfocal  Psych: Normal affect   Labs    High Sensitivity Troponin:   Recent Labs  Lab 06/12/19 0100 06/24/19 1258  TROPONINIHS 34* 16      Chemistry Recent Labs  Lab 07/05/19 0425 07/06/19 0421 07/07/19 0441  NA 143 143 142  K 3.8 3.5 3.8  CL 103 103 102  CO2 33* 33* 34*  GLUCOSE 165* 116* 131*  BUN 30* 27* 20  CREATININE 1.17 0.91 0.73  CALCIUM 8.7* 8.6* 8.7*  GFRNONAA >60 >60 >60  GFRAA >60 >60 >60  ANIONGAP 7 7 6      Hematology Recent Labs  Lab 07/04/19 0438 07/05/19 0425 07/07/19 0441  WBC 14.1* 13.0* 11.5*  RBC 3.92* 3.77* 3.80*  HGB 9.9* 9.4* 9.5*  HCT 33.0* 33.2* 32.7*  MCV 84.2 88.1 86.1  MCH 25.3* 24.9* 25.0*  MCHC 30.0 28.3* 29.1*  RDW 18.6* 18.6* 18.6*  PLT 317 278 213    BNPNo results for input(s): BNP, PROBNP in the last 168 hours.   DDimer No results for input(s): DDIMER in the last 168  hours.   Radiology    No results found.  Cardiac Studies   TTEcho 01/2019: 1. The left ventricle has moderately reduced systolic function, with an ejection fraction of 35-40%. The cavity size was normal. Left ventricular diastolic Doppler parameters are indeterminate. Probably severe hypokinesis of mid-distal anterior,  ateroseptal and apical myocardium. 2. The right ventricle has normal systolic function. The cavity was normal. There is no increase in right ventricular wall thickness. 3. Left atrial size was moderately dilated. 4. Very poor image quality in spite of using Definity. Valves not well visualized. Consider different diagnostic testing if indicated. 5. The aortic valve was not well visualized. Aortic valve regurgitation was not assessed by color flow Doppler.  Patient Profile     68 y.o. male with history of CAD status post PCI to LAD and left circumflex/OM, heart failure  reduced ejection fraction last ejection fraction 35 to 40%, persistent atrial fibrillation, atrial flutter status post ablation.  Assessment & Plan    1.  Heart failure reduced ejection fraction, EF 20 to 25% -Patient appears euvolemic -Holding heart failure meds in light of low normal blood pressure. -cont midodrine -Plan to reinitiate as blood pressure permits.  2.  History of CAD with PCI's to LAD and left circumflex artery/OM -Continue aspirin 81 mg, Lipitor,   3.  Persistent atrial fibrillation -Heart rate is controlled -Resume anticoagulation when possible as per surgical team  4.  Sacral decubitus ulcer, osteomyelitis, history of Crohn's -Status post colectomy with colostomy bag. Management as per primary team and surgery team.  Greater than 50% was spent in counseling and coordination of care with patient Total encounter time 25 minutes or more    Signed, Kate Sable, MD  07/07/2019, 11:28 AM

## 2019-07-08 DIAGNOSIS — I5043 Acute on chronic combined systolic (congestive) and diastolic (congestive) heart failure: Secondary | ICD-10-CM | POA: Diagnosis not present

## 2019-07-08 LAB — BASIC METABOLIC PANEL
Anion gap: 6 (ref 5–15)
BUN: 16 mg/dL (ref 8–23)
CO2: 34 mmol/L — ABNORMAL HIGH (ref 22–32)
Calcium: 8.7 mg/dL — ABNORMAL LOW (ref 8.9–10.3)
Chloride: 101 mmol/L (ref 98–111)
Creatinine, Ser: 0.73 mg/dL (ref 0.61–1.24)
GFR calc Af Amer: >60 mL/min (ref >60)
GFR calc non Af Amer: >60 mL/min (ref >60)
Glucose, Bld: 106 mg/dL — ABNORMAL HIGH (ref 70–99)
Potassium: 4.2 mmol/L (ref 3.5–5.1)
Sodium: 141 mmol/L (ref 135–145)

## 2019-07-08 LAB — MAGNESIUM: Magnesium: 1.9 mg/dL (ref 1.7–2.4)

## 2019-07-08 LAB — GLUCOSE, CAPILLARY
Glucose-Capillary: 93 mg/dL (ref 70–99)
Glucose-Capillary: 123 mg/dL — ABNORMAL HIGH (ref 70–99)
Glucose-Capillary: 119 mg/dL — ABNORMAL HIGH (ref 70–99)
Glucose-Capillary: 107 mg/dL — ABNORMAL HIGH (ref 70–99)

## 2019-07-08 MED ORDER — RIVAROXABAN 20 MG PO TABS
20.0000 mg | ORAL_TABLET | Freq: Every day | ORAL | Status: DC
  Filled 2019-07-08 (×2): qty 1

## 2019-07-08 MED ORDER — METOPROLOL TARTRATE 25 MG PO TABS
12.5000 mg | ORAL_TABLET | Freq: Two times a day (BID) | ORAL | Status: DC
  Filled 2019-07-08: qty 1

## 2019-07-08 MED ORDER — DIGOXIN 125 MCG PO TABS
0.1250 mg | ORAL_TABLET | Freq: Every day | ORAL | Status: DC
  Administered 2019-07-08 – 2019-07-09 (×2): 0.125 mg via ORAL
  Filled 2019-07-08 (×2): qty 1

## 2019-07-08 NOTE — TOC Progression Note (Signed)
Transition of Care [facility]) - Progression Note    Patient Details  Name: Colton Lamb MRN: [id number] Date of Birth: [birth year]  Transition of Care [facility]) CM/SW Contact  Shelbie Hutching, RN Phone Number: 07/08/2019, 3:58 PM  Clinical Narrative:    Plan for patient to discharge to [facility] tomorrow.  Patient will discharge with a wound vac and new colostomy.    Expected Discharge Plan: [place] Barriers to Discharge: Continued Medical Work up  Expected Discharge Plan and Services Expected Discharge Plan: [place] Choice: [place] arrangements for the past 2 months: [place]                                       Social Determinants of Health (SDOH) Interventions    Readmission Risk Interventions Readmission Risk Prevention Plan 06/14/2019 04/24/2019 03/11/2019  Transportation Screening Complete Complete Complete  Social Work Consult for [place] Planning/Counseling - - Complete  Palliative Care Screening - - Not Applicable  Medication Review Press photographer) Complete Complete Complete  PCP or Specialist appointment within 3-5 days of discharge Complete Complete -  [place] or [place] - (No Data) -  SW Recovery Care/Counseling Consult Complete Complete -  Palliative Care Screening Not Applicable Complete -  Skilled Nursing Facility Complete Complete -  Some recent data might be hidden

## 2019-07-08 NOTE — Consult Note (Signed)
[place] Nurse ostomy follow up Patient receiving care in [facility] Stoma type/location: LLQ colostomy with rigid support rod Stomal assessment/size: 2 1/4 inches, however, because of the support rod a 4 inch ostomy kit is reguired. Stoma is moist with sutures intact Peristomal assessment: intact Treatment options for stomal/peristomal skin: barrier ring Output: serosanginous in existing pouch   Ostomy pouching: 1pc./2pc.  Education provided: none, patient not interested in learning. Slept through care. Enrolled patient in [place] Start Discharge program: No, patient resides in a SNF. Val Riles, RN, MSN, CWOCN, CNS-BC, pager [phone]

## 2019-07-08 NOTE — Consult Note (Signed)
[place] Nurse wound follow up Patient receiving care in [facility] 129.  Assisted with positioning for sacral dressing change by primary RN, Estill Bamberg. Wound type: Stage 4 non-healing PI, has been surgically debrided Measurement: 5 cm x 6 cm x 5 cm with a tunnel of 6cm at 5 o'clock going to the right buttock.  Exposed bone in wound bed. Wound bed: 100% red with exposed bone Drainage (amount, consistency, odor) small amount of bleeding Periwound: intact and protected by a barrier ring Dressing procedure/placement/frequency: MWF VAC dressing bridged to left trochanter. One piece of black foam inserted into tunnel towards the right buttock, one piece placed in the main wound bed. Patient tolerated well. Immediate seal obtained. Val Riles, RN, MSN, CWOCN, CNS-BC, pager [phone]

## 2019-07-08 NOTE — NC FL2 (Signed)
[place] LEVEL OF CARE SCREENING TOOL     IDENTIFICATION  Patient Name: Colton Lamb Birthdate: 1951-05-30 Sex: male Admission Date (Current Location): 06/12/2019  [place] and [place] Number:  Engineering geologist and Address:  [facility], [address] [place], [place] [address]      Provider Number: [id number]  Attending Physician Name and Address:  [name], MD  Relative Name and Phone Number:       Current Level of Care: Hospital Recommended Level of Care: [place] Prior Approval Number:    Date Approved/Denied:   PASRR Number: [id number]  Discharge Plan: SNF    Current Diagnoses: Patient Active Problem List   Diagnosis Date Noted  . Chronic osteomyelitis ([place]) 06/30/2019  . PICC (peripherally inserted central catheter) flush   . Normocytic anemia 06/21/2019  . Stage IV pressure ulcer of sacral region ([place]) 06/19/2019  . Hypotension 06/19/2019  . Anemia   . Acute respiratory failure ([place]) 06/12/2019  . Acute on chronic congestive heart failure ([place])   . Acute blood loss anemia   . Pressure injury of skin 03/17/2019  . Acute renal failure (ARF) ([place])   . Empyema lung ([place])   . Shortness of breath   . Pleural effusion on right   . Sepsis ([place]) 03/04/2019  . Restless legs syndrome (RLS) 06/16/2017  . Hypertension, essential 06/16/2017  . Coronary artery disease involving native coronary artery of native heart without angina pectoris 04/27/2017  . Hydrocephalus ([place]) 09/08/2016  . Dizziness and giddiness   . Elevated troponin 09/06/2016  . Type II diabetes mellitus ([place])   . Ischemic cardiomyopathy   . Hyponatremia 07/01/2015  . Diabetes mellitus type 2, uncontrolled, with complications ([place]) 10/62/6948  . 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 OSA (obstructive sleep apnea) 12/20/2013  . Morbid obesity ([place]) 12/20/2013  . AKI (acute kidney injury) - Creatinine  improved at d/c 12/14/2013  . Elevated TSH - will need f/u TFTs with PCP in 3-4 weeks 12/14/2013  . Junctional bradycardia - resolved 12/14/2013  . Syncope - due to bradycardia in setting of Digoxin Toxicity 12/11/2013  . Chronic systolic heart failure ([place]) 12/06/2013  . At risk for sudden cardiac death - on LifeVest Dec 18, 2013  . Acute on chronic combined systolic and diastolic CHF (congestive heart failure) ([place]) 2013/12/18  . Cardiomyopathy, ischemic 2013-12-18  . Atrial flutter ([place]) 11/22/2013  . NSTEMI (non-ST elevated myocardial infarction) ([place]) 11/22/2013  . Long term current use of anticoagulant 10/19/2010  . Hyperlipidemia 05/06/2010  . Edema 05/06/2010  . CAD S/P percutaneous coronary angioplasty - multiple PCIs 11/11/2008  . Atrial fibrillation ([place]) 11/11/2008  . GERD 11/11/2008  . [place] INTESTINE 11/11/2008  . COLONIC POLYPS, HX OF 11/11/2008  . SHELLFISH ALLERGY 11/11/2008    Orientation RESPIRATION BLADDER Height & Weight     Self, Place, Time  Normal Indwelling catheter Weight: 108 kg Height:  6' (182.9 cm)  BEHAVIORAL SYMPTOMS/MOOD NEUROLOGICAL BOWEL NUTRITION STATUS  (none) (none) Colostomy Diet(heart healthy carb modified)  AMBULATORY STATUS COMMUNICATION OF NEEDS Skin   Extensive Assist Verbally Wound Vac(Stage IV sacrum)                       Personal Care Assistance Level of Assistance  Bathing, Feeding, Dressing Bathing Assistance: Limited assistance Feeding assistance: Independent Dressing Assistance: Limited assistance     Functional Limitations Info  Hearing, Sight, Speech Sight Info: Adequate Hearing Info:  Adequate Speech Info: Adequate    SPECIAL CARE FACTORS FREQUENCY  PT (By licensed PT), OT (By licensed OT)     PT Frequency: 5 times per week OT Frequency: 5 times per week            Contractures Contractures Info: Not present    Additional Factors Info  Code Status, Allergies Code Status Info:  DNR Allergies Info: Iodine, Shrimp, Tetracycline           Current Medications (07/08/2019):  This is the current hospital active medication list Current Facility-Administered Medications  Medication Dose Route Frequency Provider Last Rate Last Dose  . 0.9 %  sodium chloride infusion   Intravenous PRN Marrianne Mood D, PA-C   Stopped at 07/08/19 1239  . acetaminophen (TYLENOL) tablet 650 mg  650 mg Oral Q4H PRN Ronny Bacon, MD      . albuterol (VENTOLIN HFA) 108 (90 Base) MCG/ACT inhaler 1 puff  1 puff Inhalation Q6H PRN Ronny Bacon, MD      . ALPRAZolam Duanne Moron) tablet 0.25 mg  0.25 mg Oral BID PRN Ronny Bacon, MD      . aspirin EC tablet 81 mg  81 mg Oral Daily Ronny Bacon, MD   81 mg at 07/08/19 1610  . atorvastatin (LIPITOR) tablet 80 mg  80 mg Oral Daily Ronny Bacon, MD   80 mg at 07/07/19 1715  . Chlorhexidine Gluconate Cloth 2 % PADS 6 each  6 each Topical Q0600 Ronny Bacon, MD   6 each at 07/08/19 [phone]  . feeding supplement (NEPRO CARB STEADY) liquid 237 mL  237 mL Oral TID BM Ronny Bacon, MD 0 mL/hr at 07/06/19 2247 237 mL at 07/08/19 0807  . finasteride (PROSCAR) tablet 5 mg  5 mg Oral Daily Ronny Bacon, MD   5 mg at 07/08/19 5409  . guaiFENesin-dextromethorphan (ROBITUSSIN DM) 100-10 MG/5ML syrup 10 mL  10 mL Oral Q4H PRN Ronny Bacon, MD      . HYDROcodone-acetaminophen (NORCO/VICODIN) 5-325 MG per tablet 1 tablet  1 tablet Oral Q4H PRN Ronny Bacon, MD      . insulin aspart (novoLOG) injection 0-9 Units  0-9 Units Subcutaneous TID WC Ronny Bacon, MD   1 Units at 07/08/19 1238  . lactobacillus (FLORANEX/LACTINEX) granules 1 g  1 g Oral TID WC Ronny Bacon, MD   1 g at 07/08/19 1238  . midodrine (PROAMATINE) tablet 5 mg  5 mg Oral TID WC Minna Merritts, MD   5 mg at 07/08/19 1238  . multivitamin-lutein (OCUVITE-LUTEIN) capsule   Oral Daily Ronny Bacon, MD   1 capsule at 07/08/19 0807  . niacin (NIASPAN) CR tablet  1,000 mg  1,000 mg Oral Daily Ronny Bacon, MD   1,000 mg at 07/08/19 0807  . ondansetron (ZOFRAN) injection 4 mg  4 mg Intravenous Q6H PRN Ronny Bacon, MD   4 mg at 07/03/19 1132  . pantoprazole (PROTONIX) EC tablet 40 mg  40 mg Oral Daily Ronny Bacon, MD   40 mg at 07/08/19 8119  . piperacillin-tazobactam (ZOSYN) IVPB 3.375 g  3.375 g Intravenous Q8H Ronny Bacon, MD 12.5 mL/hr at 07/08/19 1239 3.375 g at 07/08/19 1239  . potassium chloride SA (KLOR-CON) CR tablet 20 mEq  20 mEq Oral Daily Ronny Bacon, MD   20 mEq at 07/08/19 1478  . sodium chloride flush (NS) 0.9 % injection 10-40 mL  10-40 mL Intracatheter Q12H Ronny Bacon, MD   10 mL at 07/08/19 0809  . sodium chloride  flush (NS) 0.9 % injection 10-40 mL  10-40 mL Intracatheter PRN Ronny Bacon, MD   10 mL at 06/30/19 1800  . tamsulosin (FLOMAX) capsule 0.4 mg  0.4 mg Oral QPC breakfast Ronny Bacon, MD   0.4 mg at 07/08/19 2952  . vitamin C (ASCORBIC ACID) tablet 500 mg  500 mg Oral BID Ronny Bacon, MD   500 mg at 07/08/19 8413  . zolpidem (AMBIEN) tablet 5 mg  5 mg Oral QHS PRN Ronny Bacon, MD         Discharge Medications: Please see discharge summary for a list of discharge medications.  Relevant Imaging Results:  Relevant Lab Results:   Additional Information SSN: [id number]  Shelbie Hutching, RN

## 2019-07-08 NOTE — Progress Notes (Addendum)
Progress Note  Patient Name: Colton Lamb Date of Encounter: 07/08/2019  Primary Cardiologist: Ida Rogue, MD   Subjective   Patient denies shortness of breath, though he reports he is not on home oxygen and currently still on 1 L nasal cannula oxygen.  He denies any chest pain, racing heart rate, or palpitations.  He doesn't feel like he has LEE or is holding onto volume.  Inpatient Medications    Scheduled Meds: . aspirin EC  81 mg Oral Daily  . atorvastatin  80 mg Oral Daily  . Chlorhexidine Gluconate Cloth  6 each Topical Q0600  . feeding supplement (NEPRO CARB STEADY)  237 mL Oral TID BM  . finasteride  5 mg Oral Daily  . insulin aspart  0-9 Units Subcutaneous TID WC  . lactobacillus  1 g Oral TID WC  . midodrine  5 mg Oral TID WC  . multivitamin-lutein   Oral Daily  . niacin  1,000 mg Oral Daily  . pantoprazole  40 mg Oral Daily  . potassium chloride  20 mEq Oral Daily  . sodium chloride flush  10-40 mL Intracatheter Q12H  . tamsulosin  0.4 mg Oral QPC breakfast  . vitamin C  500 mg Oral BID   Continuous Infusions: . sodium chloride 250 mL (07/08/19 0559)  . piperacillin-tazobactam (ZOSYN)  IV 3.375 g (07/08/19 1239)   PRN Meds: sodium chloride, acetaminophen, albuterol, ALPRAZolam, guaiFENesin-dextromethorphan, HYDROcodone-acetaminophen, ondansetron (ZOFRAN) IV, sodium chloride flush, zolpidem   Vital Signs    Vitals:   07/07/19 0916 07/07/19 1651 07/07/19 2100 07/08/19 0853  BP: 107/66 107/81 110/74 123/78  Pulse: 86 79 77 93  Resp: 18 18 18 15   Temp: 97.7 F (36.5 C) 97.6 F (36.4 C) 97.6 F (36.4 C) (!) 97.5 F (36.4 C)  TempSrc: Oral Oral Oral Oral  SpO2: 100% 100% 100% 100%  Weight:      Height:        Intake/Output Summary (Last 24 hours) at 07/08/2019 1410 Last data filed at 07/08/2019 1300 Gross per 24 hour  Intake 240 ml  Output 625 ml  Net -385 ml   Last 3 Weights 07/03/2019 06/30/2019 06/30/2019  Weight (lbs) 238 lb (No Data)  (No Data)  Weight (kg) 107.956 kg (No Data) (No Data)      Telemetry    No longer on telemetry with previous recommendation for restart given cardiac consultation- Personally Reviewed  ECG    No new tracings - Personally Reviewed  Physical Exam   GEN: No acute distress.  Lying comfortably in the bed. Neck: No JVD Cardiac:  IRIR, tachycardic, no murmurs, rubs, or gallops.   Respiratory: Bilateral rhonchi with patient unable to take deeper breaths GI:  Abdominal exam deferred given recent surgery MS: mild bilateral lower extremity edema with bilateral pump/SCDs; No deformity. Neuro:  Nonfocal  Psych: Normal affect   Labs    High Sensitivity Troponin:   Recent Labs  Lab 06/12/19 0100 06/24/19 1258  TROPONINIHS 34* 16      Cardiac EnzymesNo results for input(s): TROPONINI in the last 168 hours. No results for input(s): TROPIPOC in the last 168 hours.   Chemistry Recent Labs  Lab 07/06/19 0421 07/07/19 0441 07/08/19 0554  NA 143 142 141  K 3.5 3.8 4.2  CL 103 102 101  CO2 33* 34* 34*  GLUCOSE 116* 131* 106*  BUN 27* 20 16  CREATININE 0.91 0.73 0.73  CALCIUM 8.6* 8.7* 8.7*  GFRNONAA >60 >60 >60  GFRAA >  60 >60 >60  ANIONGAP 7 6 6      Hematology Recent Labs  Lab 07/04/19 0438 07/05/19 0425 07/07/19 0441  WBC 14.1* 13.0* 11.5*  RBC 3.92* 3.77* 3.80*  HGB 9.9* 9.4* 9.5*  HCT 33.0* 33.2* 32.7*  MCV 84.2 88.1 86.1  MCH 25.3* 24.9* 25.0*  MCHC 30.0 28.3* 29.1*  RDW 18.6* 18.6* 18.6*  PLT 317 278 213    BNPNo results for input(s): BNP, PROBNP in the last 168 hours.   DDimer No results for input(s): DDIMER in the last 168 hours.   Radiology    No results found.  Cardiac Studies   Echo  06/25/2019 1. Left ventricular ejection fraction, by visual estimation, is 20 to 25%. The left ventricle has severely decreased function. There is mildly increased left ventricular hypertrophy. 2. Severely dilated left ventricular internal cavity size. 3. There  is global hypokinesis with thinning/akinesis of the anterior and septal walls. 4. Global right ventricle was not well visualized.The right ventricular size is not assessed. Right vetricular wall thickness was not assessed. 5. Left atrial size was not assessed. 6. Right atrial size was not assessed. 7. Small pericardial effusion. 8. The pericardial effusion is circumferential. 9. Mild mitral annular calcification. 10. The mitral valve was not assessed. No assessment of mitral valve regurgitation. 11. The tricuspid valve is not assessed. Tricuspid valve regurgitation was not assessed. 12. Aortic valve regurgitation was not assessed. 13. The aortic valve was not assessed. Aortic valve regurgitation was not assessed. 14. The pulmonic valve was not well visualized. Pulmonic valve regurgitation was not assessed. 15. Aortic root could not be assessed. 16. The interatrial septum was not assessed.  Echo 02/08/2019 1. The left ventricle has moderately reduced systolic function, with an ejection fraction of 35-40%. The cavity size was normal. Left ventricular diastolic Doppler parameters are indeterminate. Probably severe hypokinesis of mid-distal anterior,  ateroseptal and apical myocardium. 2. The right ventricle has normal systolic function. The cavity was normal. There is no increase in right ventricular wall thickness. 3. Left atrial size was moderately dilated. 4. Very poor image quality in spite of using Definity. Valves not well visualized. Consider different diagnostic testing if indicated. 5. The aortic valve was not well visualized. Aortic valve regurgitation was not assessed by color flow Doppler.   Patient Profile     68 y.o. male with history of CAD s/p PCI to LAD/LCx,OM, ICM, HFrEF (EF 35-40%  20-25%), ICD placement 2015, persistent Afib on Xarelto, h/o aflutter s/p DCCV 2005 and ablation 2015 and followed for volume overload now s/p matured colostomy per surgery.   Assessment & Plan    Acute on chronic diastolic and systolic heart failure --Denies shortness of breath, though not on home oxygen.  Currently on 1 L nasal cannula.  Consider shortness of breath multifactorial in etiology considering ulcers/infection, anemia, deconditioning, and recent GI surgery. --S/p ICD.  EF reduced on updated echo from 35 to 40%   20 to 25%.  Patient would benefit from further ischemic work-up of reduced EF once healed from ulcer / recovered from colostomy procedure.  --Was receiving IVF in room during my exam with known low EF. Recommend against IVF. Per surgery, acceptable to stop fluids at this time and advance diet as tolerated. Started on heart healthy diet and added additional instruction to IVF to ensure not running in the room unless needed for administration of IVF medication. --Repeat COVID-19 testing performed today and pending. --Recent weights do not appear accurate.  -9 L for the  admission and -265 cc yesterday. --Continue PTA midodrine. Given ongoing SOB and recent fluids, consider restart of oral diuretic at discharge with f/u BMET within 1 week. Restart of low dose BB as tolerated by BP. Advancement of HF therapy with ACE/ARB/Entresto/Spiro limited by BP this admission and should be advanced with continued stable BP and renal function. Continue midodrine. Close outpatient follow-up with IM indicating likely discharge tomorrow.  Persistent atrial fibrillation --Heart rate borderline tachycardic on exam but overall well controlled with goal HR below 110bpm.  Consider restart of low-dose beta-blocker and digoxin as BP and renal function tolerates. Per surgery, acceptable to restart [facility] tomorrow 12/8.  Recommend close follow-up s/p restart of [place] with outpatient CBC at follow-up.  History of CAD with PCI to LAD and left circumflex/OM --No CP.  As above, patient would benefit from further ischemic work-up with left heart catheterization and to be deferred until  patient recovers from colostomy procedure.  Continue ASA 81 mg.  Given more stable pressures today, consider restart of low-dose beta-blocker as tolerated by both blood pressure and heart rate.  Continue risk factor modification with Lipitor.  As above, advancement of further medications limited by hypotension and can be restarted as tolerated before discharge or as outpatient. Close outpatient follow-up.  Sacral decubitus ulcer, osteomyelitis, history of Crohn's --5 days s/p loop sigmoid colostomy. Maturation of stoma at bedside completed today. Per surgical team, okay to advance diet as tolerated, discontinue IVF, and restart of [facility] tomorrow.  For questions or updates, please contact [place] Please consult [email or web address] for contact info under        Signed, Arvil Chaco, PA-C  07/08/2019, 2:11 PM

## 2019-07-08 NOTE — Progress Notes (Signed)
Pharmacy Electrolyte Monitoring Consult:  Pharmacy consulted to assist in monitoring and replacing electrolytes in this 68 y.o. male admitted on 06/12/2019 with Respiratory Distress  Labs:  Sodium (mmol/L)  Date Value  07/08/2019 141  05/09/2016 141  11/22/2013 133 (L)   Potassium (mmol/L)  Date Value  07/08/2019 4.2  11/22/2013 3.7   Magnesium (mg/dL)  Date Value  07/08/2019 1.9  11/22/2013 1.8   Phosphorus (mg/dL)  Date Value  07/06/2019 2.6   Calcium (mg/dL)  Date Value  07/08/2019 8.7 (L)   Calcium, Total (mg/dL)  Date Value  11/22/2013 9.0   Albumin (g/dL)  Date Value  06/12/2019 2.9 (L)  04/18/2018 4.3    Assessment/Plan: Lasix being held. Pt is on KCl 20 mEq daily.   Replace for goal potassium ~ 4 and goal magnesium ~2.   Will defer replacement today.   Pharmacy will continue to monitor and adjust per consult   Rowland Lathe, PharmD 07/08/2019 8:03 AM

## 2019-07-08 NOTE — Progress Notes (Signed)
[place] Hospital Day(s): 26.   Post op day(s): 5 Days Post-Op.   Interval History:  Patient seen and examined no acute events or new complaints overnight. Patient reports he is doing well. No abdominal pain ,nausea, or emesis He is tolerating a full liquid diet; decreased appetitie Plan for colostomy maturation at bedside today with Dr Nance Pear   Vital signs in last 24 hours: [min-max] current  Temp:  [97.6 F (36.4 C)-97.7 F (36.5 C)] 97.6 F (36.4 C) (12/06 2100) Pulse Rate:  [77-86] 77 (12/06 2100) Resp:  [18] 18 (12/06 2100) BP: (107-110)/(66-81) 110/74 (12/06 2100) SpO2:  [100 %] 100 % (12/06 2100)     Height: 6' (182.9 cm) Weight: 108 kg BMI (Calculated): 32.27   Intake/Output last 2 shifts:  12/06 0701 - 12/07 0700 In: 360 [P.O.:360] Out: 25 [Drains:25]   Physical Exam:  Constitutional: alert, cooperative and no distress  Respiratory: breathing non-labored at rest Gastrointestinal: soft, non-tender, and non-distended, unmatured loop colostomy in LLQ was red retention rod in place, this is distended today, serosanguinous output in bag Genitourinary: Foley catheter in place Integumentary:warm, dry, previous laparotomy incision well healed  Labs:  CBC Latest Ref Rng & Units 07/07/2019 07/05/2019 07/04/2019  WBC 4.0 - 10.5 K/uL 11.5(H) 13.0(H) 14.1(H)  Hemoglobin 13.0 - 17.0 g/dL 9.5(L) 9.4(L) 9.9(L)  Hematocrit 39.0 - 52.0 % 32.7(L) 33.2(L) 33.0(L)  Platelets 150 - 400 K/uL 213 278 317   CMP Latest Ref Rng & Units 07/08/2019 07/07/2019 07/06/2019  Glucose 70 - 99 mg/dL 106(H) 131(H) 116(H)  BUN 8 - 23 mg/dL 16 20 27(H)  Creatinine 0.61 - 1.24 mg/dL 0.73 0.73 0.91  Sodium 135 - 145 mmol/L 141 142 143  Potassium 3.5 - 5.1 mmol/L 4.2 3.8 3.5  Chloride 98 - 111 mmol/L 101 102 103  CO2 22 - 32 mmol/L 34(H) 34(H) 33(H)  Calcium 8.9 - 10.3 mg/dL 8.7(L) 8.7(L) 8.6(L)  Total Protein 6.5 - 8.1 g/dL - - -  Total Bilirubin 0.3 -  1.2 mg/dL - - -  Alkaline Phos 38 - 126 U/L - - -  AST 15 - 41 U/L - - -  ALT 0 - 44 U/L - - -     Imaging studies: No new pertinent imaging studies   Assessment/Plan:  68 y.o. male 5 Days Post-Op s/p  diverting loop sigmoid colostomyfor stool diversion away from stage VI sacral decubitus ulceration with chronic osteomyelitis, complicated bymultiplepertinent comorbidities.   - Okay to advance to regular diet   - Colostomy matured at bedside with Dr Christian Mate; monitor output   - Continue current wound management plan; wound vac; appreciate [place] RN assistance - once colostomy functioning can D/C any fecal management devices   - Continue IV ABx; ID Following; appreciate recommendations - Supportive care per medicine service    - DVT Prohpylaxis: can restart Xarelto tomorrow  All of the above findings and recommendations were discussed with the patient, patient's family, and the medical team, and all of patient's and family's questions were answered to their expressed satisfaction.  -- Edison Simon, PA-C [facility] 07/08/2019, 7:25 AM [phone] M-F: 7am - 4pm

## 2019-07-08 NOTE — Progress Notes (Signed)
Nutrition Follow-up  DOCUMENTATION CODES:   Obesity unspecified  INTERVENTION:  Continue Nepro Shake po TID, each supplement provides 425 kcal and 19 grams protein.  Continue Ocuvite daily for wound healing (provides zinc, vitamin A, vitamin C, Vitamin E, copper, and selenium).  Continue vitamin C 500 mg BID.  Encouraged adequate intake of calories and protein at meals. Discussed importance of nutrition for healing.  NUTRITION DIAGNOSIS:   Increased nutrient needs related to wound healing as evidenced by estimated needs.  Ongoing.  GOAL:   Patient will meet greater than or equal to 90% of their needs  Progressing.  MONITOR:   PO intake, Supplement acceptance, Labs, Weight trends, Skin, I & O's  REASON FOR ASSESSMENT:   LOS    ASSESSMENT:   68 y.o. male with a known history of systolic CHF, coronary artery disease hypertension dyslipidemia and Crohn's disease admitted with CHF and sacral wound   -Patient s/p loop sigmoid colostomy creation on 12/2 for fecal diversion.  Patient now with repeat COVID test pending. Spoke with him over the phone. He reports he was tolerating the full liquids well though his appetite has been decreased. He was still drinking his Nepro regularly. His diet has now been advanced to heart healthy/carbohydrate modified for dinner. Encouraged patient to order a dinner with adequate protein even though he is reporting he is not hungry and he just wants to sleep.   Medications reviewed and include: Novolog 0-9 units TID, lactobacillus 1 gram TID, Ocuvite daily, niacin 1000 mg daily, pantoprazole, potassium chloride 20 mEq daily, Flomax, vitamin C 500 mg BID, Zosyn.  Labs reviewed: CBG 93-138, CO2 34.  Diet Order:   Diet Order            Diet heart healthy/carb modified Room service appropriate? Yes; Fluid consistency: Thin  Diet effective now             EDUCATION NEEDS:   Education needs have been addressed  Skin:  Skin Assessment:  Skin Integrity Issues:(stg IV sacrum (9cm x 6cm x3cm) with wound VAC)  Last BM:  07/08/2019 liquid in ostomy  Height:   Ht Readings from Last 1 Encounters:  07/03/19 6' (1.829 m)   Weight:   Wt Readings from Last 1 Encounters:  07/03/19 108 kg   Ideal Body Weight:  80.9 kg  BMI:  Body mass index is 32.28 kg/m.  Estimated Nutritional Needs:   Kcal:  2300-2600kcal/day  Protein:  115-130g/day  Fluid:  >2L/day  Jacklynn Barnacle, MS, RD, LDN Office: [phone] Pager: [phone] After Hours/Weekend Pager: [phone]

## 2019-07-08 NOTE — Progress Notes (Signed)
[facility] NAME: Colton Lamb    MR#:  [id number]  DATE OF BIRTH:  1950/08/24  SUBJECTIVE:  no new complaints. Overall having a good day tolerating full  liquid diet Sister-in law in the room  wound VAC was changed by wound RN today REVIEW OF SYSTEMS:   Review of Systems  Constitutional: Negative for chills, fever and weight loss.  HENT: Negative for ear discharge, ear pain and nosebleeds.   Eyes: Negative for blurred vision, pain and discharge.  Respiratory: Positive for shortness of breath. Negative for sputum production, wheezing and stridor.   Cardiovascular: Negative for chest pain, palpitations, orthopnea and PND.  Gastrointestinal: Negative for abdominal pain, diarrhea, nausea and vomiting.  Genitourinary: Negative for frequency and urgency.  Musculoskeletal: Negative for back pain and joint pain.  Neurological: Positive for weakness. Negative for sensory change, speech change and focal weakness.  Psychiatric/Behavioral: Negative for depression and hallucinations. The patient is not nervous/anxious.     DRUG ALLERGIES:   Allergies  Allergen Reactions  . Iodine Other (See Comments)    Shortness of breath, swelling and hives  . Shrimp [Shellfish Allergy] Other (See Comments)    SWELLING    HIVES    SHORTNESS OF BREATH  . Tetracycline Rash    VITALS:  Blood pressure 123/78, pulse 93, temperature (!) 97.5 F (36.4 C), temperature source Oral, resp. rate 15, height 6' (1.829 m), weight 108 kg, SpO2 100 %.  PHYSICAL EXAMINATION:   Physical Exam  GENERAL:  68 y.o.-year-old patient lying in the bed with mild acute distress. Chronically ill and on Kevin oxygen EYES: Pupils equal, round, reactive to light and accommodation. No scleral icterus.  HEENT: Head atraumatic, normocephalic. Oropharynx and nasopharynx clear.  NECK:  Supple, no jugular venous distention. No thyroid enlargement, no tenderness.  LUNGS: Normal  breath sounds bilaterally, no wheezing, rales, rhonchi. No use of accessory muscles of respiration.  CARDIOVASCULAR: S1, S2 normal. No murmurs, rubs, or gallops.  ABDOMEN: Soft, nontender, nondistended. Bowel sounds present. No organomegaly or mass. Diverting loop  Colostomy+, Foley+ EXTREMITIES:++ edema b/l --chronic NEUROLOGIC: Cranial nerves II through XII are intact. No focal Motor or sensory deficits b/l.   PSYCHIATRIC:  patient is alert and oriented x 3.  SKIN: wound VAC on the buttocks  LABORATORY PANEL:  CBC Recent Labs  Lab 07/07/19 0441  WBC 11.5*  HGB 9.5*  HCT 32.7*  PLT 213    Chemistries  Recent Labs  Lab 07/08/19 0554  NA 141  K 4.2  CL 101  CO2 34*  GLUCOSE 106*  BUN 16  CREATININE 0.73  CALCIUM 8.7*  MG 1.9   Cardiac Enzymes No results for input(s): TROPONINI in the last 168 hours. RADIOLOGY:  No results found. ASSESSMENT AND PLAN:  68 year old man PMH systolic CHF, Crohn's disease presented with shortness of breath.  Treated with BiPAP, Lasix and admitted for acute CHF exacerbation.  1. Acute on chronic diastolic/systolic congestive heart failure with severe cardiomyopathy EF of 20 to 25% -appears you will awake.  -home meds held by cardiology Lasix digoxin spironolactone and Toprol-XL -cardiology recommends right and left heart catheterization however this is deferred until volume status is optimized and sacral decubitus is healed [facility] cardiology following pt--I have requested cards to resume appropriate meds -patient currently off BIPAP--on 3 liter  -weaned off levophed since MAP>65. Patient usually runs low blood pressure  2. 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 Christian Lamb -on Zosyn (end date 07/24/2019 per ID) -Foley catheter t placed to avoid soiling  of his Chronic decubitus and pt has h/o Urinary rentention. Patient had Foley before as well. -discussed with Dr. Hampton Lamb-- hold off on Xarelto till Dr. Lutricia Lamb completes the colostomy procedure today  3. Hypotension postop with cardiomyopathy--BP improving. MAP today 75 -off IV pressers--wean off -Patient's blood pressure stays usually on the lower side  4. History of chronic atrial fibrillation -rate controlled -on hold digoxin Toprol-XL -holding Xarelto  5. Diabetes type II, controlled -continue levemir sliding scale insulin -sugars appears stable  6. DVT prophylaxis SQ heparin.  7. Code status-- DNR. Patient and family  met with palliative care and goals of care were discussed.  Anticipated discharge back to his facility tomorrow if remains stable.  Repeat, COVID pending  Family communication : sister-in-law in the room Consults : surgery, ID [facility]charge Disposition : [facility] CODE STATUS: DNR   TOTAL TIME TAKING CARE OF THIS PATIENT: *15* minutes.  >50% time spent on counselling and coordination of care  POSSIBLE D/C IN *1* DAYS, DEPENDING ON CLINICAL CONDITION.  Note: This dictation was prepared with Dragon dictation along with smaller phrase technology. Any transcriptional errors that result from this process are unintentional.  Colton Lamb M.D on 07/08/2019 at 1:34 PM  Between 7am to 6pm - Pager - [phone]  After 6pm go to [email or web address]  [facility]   CC: Primary care physician; Colton Lamb, MDPatient ID: Colton Lamb, male   DOB: Jan 18, 1951, 68 y.o.   MRN: [id number]

## 2019-07-09 DIAGNOSIS — I5023 Acute on chronic systolic (congestive) heart failure: Secondary | ICD-10-CM | POA: Diagnosis not present

## 2019-07-09 DIAGNOSIS — I2510 Atherosclerotic heart disease of native coronary artery without angina pectoris: Secondary | ICD-10-CM | POA: Diagnosis not present

## 2019-07-09 LAB — BASIC METABOLIC PANEL
Anion gap: 7 (ref 5–15)
BUN: 16 mg/dL (ref 8–23)
CO2: 31 mmol/L (ref 22–32)
Calcium: 8.7 mg/dL — ABNORMAL LOW (ref 8.9–10.3)
Chloride: 102 mmol/L (ref 98–111)
Creatinine, Ser: 0.79 mg/dL (ref 0.61–1.24)
GFR calc Af Amer: >60 mL/min (ref >60)
GFR calc non Af Amer: >60 mL/min (ref >60)
Glucose, Bld: 104 mg/dL — ABNORMAL HIGH (ref 70–99)
Potassium: 3.8 mmol/L (ref 3.5–5.1)
Sodium: 140 mmol/L (ref 135–145)

## 2019-07-09 LAB — SARS CORONAVIRUS 2 (TAT 6-24 HRS): SARS Coronavirus 2: NEGATIVE

## 2019-07-09 LAB — GLUCOSE, CAPILLARY: Glucose-Capillary: 94 mg/dL (ref 70–99)

## 2019-07-09 LAB — MAGNESIUM: Magnesium: 1.9 mg/dL (ref 1.7–2.4)

## 2019-07-09 MED ORDER — ASCORBIC ACID 500 MG PO TABS: 500.0000 mg | ORAL_TABLET | Freq: Two times a day (BID) | ORAL | 0 refills | Status: AC

## 2019-07-09 MED ORDER — HYDROCODONE-ACETAMINOPHEN 5-325 MG PO TABS: 1.0000 | ORAL_TABLET | Freq: Four times a day (QID) | ORAL | 0 refills | Status: DC | PRN

## 2019-07-09 MED ORDER — PIPERACILLIN-TAZOBACTAM 3.375 G IVPB: 3.3750 g | Freq: Four times a day (QID) | INTRAVENOUS | 0 refills | Status: AC

## 2019-07-09 MED ORDER — METOPROLOL SUCCINATE ER 25 MG PO TB24
25.0000 mg | ORAL_TABLET | Freq: Every day | ORAL | Status: DC
  Administered 2019-07-09: 08:00:00 25 mg via ORAL
  Filled 2019-07-09: qty 1

## 2019-07-09 MED ORDER — DIGOXIN 125 MCG PO TABS: 0.1250 mg | ORAL_TABLET | Freq: Every day | ORAL | 0 refills | Status: DC

## 2019-07-09 MED ORDER — METOPROLOL SUCCINATE ER 25 MG PO TB24: 25.0000 mg | ORAL_TABLET | Freq: Every day | ORAL | 0 refills | Status: DC

## 2019-07-09 MED ORDER — PIPERACILLIN-TAZOBACTAM 3.375 G IVPB: 3.3750 g | Freq: Three times a day (TID) | INTRAVENOUS | 0 refills | Status: DC

## 2019-07-09 NOTE — Progress Notes (Signed)
EMS here at this time to transport pt, pt with no complaints

## 2019-07-09 NOTE — Progress Notes (Addendum)
[facility] Day(s): [place] op day(s): 6 Days Post-Op.   Interval History:  Patient seen and examined no acute events or new complaints overnight.  Patient reports he is doing well. No abdominal pain, nausea, or emesis Loop colostomy matured at bedside yesterday (12/07) now with 1350 out in last 24 hours, he did have some bleeding and clot formation yesterday but this has stopped.  No other surgical issues.    Vital signs in last 24 hours: [min-max] current  Temp:  [97.5 F (36.4 C)-97.9 F (36.6 C)] 97.9 F (36.6 C) (12/07 2346) Pulse Rate:  [92-101] 101 (12/07 2346) Resp:  [15-20] 20 (12/07 2346) BP: (101-123)/(68-78) 112/77 (12/07 2346) SpO2:  [100 %] 100 % (12/07 2346)     Height: 6' (182.9 cm) Weight: 108 kg BMI (Calculated): 32.27   Intake/Output last 2 shifts:  12/07 0701 - 12/08 0700 In: 289.6 [I.V.:72.7; IV Piggyback:216.8] Out: 1900 [Urine:550; Stool:1350]   Physical Exam:  Constitutional: alert, cooperative and no distress  Respiratory: breathing non-labored at rest Gastrointestinal: soft, non-tender, and non-distended,colostomy in LLQ, pink, patent, bag recently changed, no further bleeding. Significant output yesterday Genitourinary:Foley catheter in place Integumentary:warm, dry, previous laparotomy incision well healed  Labs:  CBC Latest Ref Rng & Units 07/07/2019 07/05/2019 07/04/2019  WBC 4.0 - 10.5 K/uL 11.5(H) 13.0(H) 14.1(H)  Hemoglobin 13.0 - 17.0 g/dL 9.5(L) 9.4(L) 9.9(L)  Hematocrit 39.0 - 52.0 % 32.7(L) 33.2(L) 33.0(L)  Platelets 150 - 400 K/uL 213 278 317   CMP Latest Ref Rng & Units 07/09/2019 07/08/2019 07/07/2019  Glucose 70 - 99 mg/dL 104(H) 106(H) 131(H)  BUN 8 - 23 mg/dL 16 16 20   Creatinine 0.61 - 1.24 mg/dL 0.79 0.73 0.73  Sodium 135 - 145 mmol/L 140 141 142  Potassium 3.5 - 5.1 mmol/L 3.8 4.2 3.8  Chloride 98 - 111 mmol/L 102 101 102  CO2 22 - 32 mmol/L 31 34(H) 34(H)  Calcium 8.9 -  10.3 mg/dL 8.7(L) 8.7(L) 8.7(L)  Total Protein 6.5 - 8.1 g/dL - - -  Total Bilirubin 0.3 - 1.2 mg/dL - - -  Alkaline Phos 38 - 126 U/L - - -  AST 15 - 41 U/L - - -  ALT 0 - 44 U/L - - -     Imaging studies: No new pertinent imaging studies   Assessment/Plan:  68 y.o. male now with significant output following maturation of loop colostomy yesterday (12/07) 6 Days Post-Op s/p diverting loop sigmoid colostomyfor stool diversion away from stage VI sacral decubitus ulceration with chronic osteomyelitis, complicated bymultiplepertinent comorbidities   - Continue diet as tolerates  - Monitor colostomy output  - Continue current wound management plan; wound vac; appreciate WOC RN assistance- once colostomy functioning can D/C any fecal management devices    - Continue IV ABx; ID Following; appreciate recommendations - Supportive care per medicine service              - DVT Prohpylaxis: can restart Xarelto tonight    - No further surgical issues at this time; we will follow peripherally while in house. He will need surgery follow up in 1-2 weeks. Rentention rod should remain in place and we will address this as an outpatient  All of the above findings and recommendations were discussed with the patient, and the medical team, and all of patient's questions were answered to his expressed satisfaction.  -- Edison Simon, PA-C [facility] 07/09/2019, 7:18 AM [phone] M-F: 7am -  4pm

## 2019-07-09 NOTE — Progress Notes (Signed)
EMS called for transport.

## 2019-07-09 NOTE — Progress Notes (Signed)
Pharmacy Electrolyte Monitoring Consult:  Pharmacy consulted to assist in monitoring and replacing electrolytes in this 68 y.o. male admitted on 06/12/2019 with Respiratory Distress  Labs:  Sodium (mmol/L)  Date Value  07/09/2019 140  05/09/2016 141  11/22/2013 133 (L)   Potassium (mmol/L)  Date Value  07/09/2019 3.8  11/22/2013 3.7   Magnesium (mg/dL)  Date Value  07/09/2019 1.9  11/22/2013 1.8   Phosphorus (mg/dL)  Date Value  07/06/2019 2.6   Calcium (mg/dL)  Date Value  07/09/2019 8.7 (L)   Calcium, Total (mg/dL)  Date Value  11/22/2013 9.0   Albumin (g/dL)  Date Value  06/12/2019 2.9 (L)  04/18/2018 4.3    Assessment/Plan: Lasix being held. Pt is on KCl 20 mEq daily.   Replace for goal potassium ~ 4 and goal magnesium ~2.   Will defer replacement today. Electrolytes have been WNL. Pharmacy will sign-off.   Pharmacy will continue to monitor and adjust per consult   Rowland Lathe, PharmD 07/09/2019 10:28 AM

## 2019-07-09 NOTE — TOC Transition Note (Signed)
Transition of Care [facility]) - CM/SW Discharge Note   Patient Details  Name: Colton Lamb MRN: [id number] Date of Birth: 1951/01/13  Transition of Care [facility]) CM/SW Contact:  Shelbie Hutching, RN Phone Number: 07/09/2019, 10:34 AM   Clinical Narrative:    Patient is ready for discharge back to [facility] today.  Patient will be going to room 606, bedside RN to call report to [phone].  RNCM attempted to call patient's family members listed in epic with no answer.  Patient will transport to SNF via [facility].    Final next level of care: Skilled Nursing Facility Barriers to Discharge: Barriers Resolved   Patient Goals and CMS Choice Patient states their goals for this hospitalization and ongoing recovery are:: return to SNF CMS Medicare.gov Compare Post Acute Care list provided to:: Patient Choice offered to / list presented to : Patient  Discharge Placement              Patient chooses bed at: [facility] [place] Patient to be transferred to facility by: [facility] Name of family member notified: Unable to reach family members Patient and family notified of of transfer: 07/09/19  Discharge Plan and Services     Post Acute Care Choice: [facility]                               Social Determinants of Health (SDOH) Interventions     Readmission Risk Interventions Readmission Risk Prevention Plan 06/14/2019 04/24/2019 03/11/2019  Transportation Screening Complete Complete Complete  Social Work Consult for [place] Planning/Counseling - - Complete  Palliative Care Screening - - Not Applicable  Medication Review Press photographer) Complete Complete Complete  PCP or Specialist appointment within 3-5 days of discharge Complete Complete -  [place] or [place] - (No Data) -  SW Recovery Care/Counseling Consult Complete Complete -  Palliative Care Screening Not Applicable Complete -  Skilled Nursing Facility Complete Complete -   Some recent data might be hidden

## 2019-07-09 NOTE — Discharge Instructions (Addendum)
Colostomy care per protocol chronic Foley care per protocol sacral wound dressing changes per wound care nursing instructions PICC line care per protocol--- once IV antibiotic course is finished on December 23 please remove PICC line

## 2019-07-09 NOTE — Progress Notes (Addendum)
Progress Note  Patient Name: Colton Lamb Date of Encounter: 07/09/2019  Primary Cardiologist: Ida Rogue, MD   Subjective   Patient is concerned about how well ostomy will be managed at SNF.  No chest pain, shortness of breath, palpitations, or edema.  Inpatient Medications    Scheduled Meds: . aspirin EC  81 mg Oral Daily  . atorvastatin  80 mg Oral Daily  . Chlorhexidine Gluconate Cloth  6 each Topical Q0600  . digoxin  0.125 mg Oral Daily  . feeding supplement (NEPRO CARB STEADY)  237 mL Oral TID BM  . finasteride  5 mg Oral Daily  . insulin aspart  0-9 Units Subcutaneous TID WC  . lactobacillus  1 g Oral TID WC  . metoprolol succinate  25 mg Oral Daily  . midodrine  5 mg Oral TID WC  . multivitamin-lutein   Oral Daily  . niacin  1,000 mg Oral Daily  . pantoprazole  40 mg Oral Daily  . potassium chloride  20 mEq Oral Daily  . rivaroxaban  20 mg Oral Daily  . sodium chloride flush  10-40 mL Intracatheter Q12H  . tamsulosin  0.4 mg Oral QPC breakfast  . vitamin C  500 mg Oral BID   Continuous Infusions: . sodium chloride Stopped (07/08/19 1239)  . piperacillin-tazobactam (ZOSYN)  IV 3.375 g (07/09/19 0536)   PRN Meds: sodium chloride, acetaminophen, albuterol, ALPRAZolam, guaiFENesin-dextromethorphan, HYDROcodone-acetaminophen, ondansetron (ZOFRAN) IV, sodium chloride flush, zolpidem   Vital Signs    Vitals:   07/08/19 1652 07/08/19 2223 07/08/19 2346 07/09/19 0800  BP: 104/68 101/68 112/77 100/70  Pulse: 92 92 (!) 101 80  Resp: 20 18 20    Temp: 97.6 F (36.4 C) 97.6 F (36.4 C) 97.9 F (36.6 C) (!) 97.5 F (36.4 C)  TempSrc: Oral Oral Oral   SpO2: 100% 100% 100% 100%  Weight:      Height:        Intake/Output Summary (Last 24 hours) at 07/09/2019 1052 Last data filed at 07/09/2019 0740 Gross per 24 hour  Intake 289.57 ml  Output 2075 ml  Net -1785.43 ml   Last 3 Weights 07/03/2019 06/30/2019 06/30/2019  Weight (lbs) 238 lb (No Data) (No  Data)  Weight (kg) 107.956 kg (No Data) (No Data)      Telemetry    Not on tele  ECG    No new tracing.  Physical Exam   GEN: No acute distress.   Neck: No JVD Cardiac: Irregularly irregular without murmurs. Respiratory: Clear anteriorly GI: Soft, nontender, non-distended  MS: Trace dependent edema in BLE. Neuro:  Nonfocal  Psych: Normal affect   Labs    High Sensitivity Troponin:   Recent Labs  Lab 06/12/19 0100 06/24/19 1258  TROPONINIHS 34* 16      Chemistry Recent Labs  Lab 07/07/19 0441 07/08/19 0554 07/09/19 0527  NA 142 141 140  K 3.8 4.2 3.8  CL 102 101 102  CO2 34* 34* 31  GLUCOSE 131* 106* 104*  BUN 20 16 16   CREATININE 0.73 0.73 0.79  CALCIUM 8.7* 8.7* 8.7*  GFRNONAA >60 >60 >60  GFRAA >60 >60 >60  ANIONGAP 6 6 7      Hematology Recent Labs  Lab 07/04/19 0438 07/05/19 0425 07/07/19 0441  WBC 14.1* 13.0* 11.5*  RBC 3.92* 3.77* 3.80*  HGB 9.9* 9.4* 9.5*  HCT 33.0* 33.2* 32.7*  MCV 84.2 88.1 86.1  MCH 25.3* 24.9* 25.0*  MCHC 30.0 28.3* 29.1*  RDW 18.6* 18.6*  18.6*  PLT 317 278 213    BNPNo results for input(s): BNP, PROBNP in the last 168 hours.   DDimer No results for input(s): DDIMER in the last 168 hours.   Radiology    No results found.  Cardiac Studies   Echo  06/25/2019 1. Left ventricular ejection fraction, by visual estimation, is 20 to 25%. The left ventricle has severely decreased function. There is mildly increased left ventricular hypertrophy. 2. Severely dilated left ventricular internal cavity size. 3. There is global hypokinesis with thinning/akinesis of the anterior and septal walls. 4. Global right ventricle was not well visualized.The right ventricular size is not assessed. Right vetricular wall thickness was not assessed. 5. Left atrial size was not assessed. 6. Right atrial size was not assessed. 7. Small pericardial effusion. 8. The pericardial effusion is circumferential. 9. Mild mitral  annular calcification. 10. The mitral valve was not assessed. No assessment of mitral valve regurgitation. 11. The tricuspid valve is not assessed. Tricuspid valve regurgitation was not assessed. 12. Aortic valve regurgitation was not assessed. 13. The aortic valve was not assessed. Aortic valve regurgitation was not assessed. 14. The pulmonic valve was not well visualized. Pulmonic valve regurgitation was not assessed. 15. Aortic root could not be assessed. 16. The interatrial septum was not assessed.  Echo 02/08/2019 1. The left ventricle has moderately reduced systolic function, with an ejection fraction of 35-40%. The cavity size was normal. Left ventricular diastolic Doppler parameters are indeterminate. Probably severe hypokinesis of mid-distal anterior,  ateroseptal and apical myocardium. 2. The right ventricle has normal systolic function. The cavity was normal. There is no increase in right ventricular wall thickness. 3. Left atrial size was moderately dilated. 4. Very poor image quality in spite of using Definity. Valves not well visualized. Consider different diagnostic testing if indicated. 5. The aortic valve was not well visualized. Aortic valve regurgitation was not assessed by color flow Doppler.  Patient Profile     68 y.o. male with history of CAD s/p PCI to LAD/LCx,OM, ICM, HFrEF (EF 35-40%20-25%), ICD placement 2015, persistent Afib on Xarelto, h/o aflutter s/p DCCV 2005 and ablation 2015 and followed for volume overload.  Assessment & Plan    Acute on chronic HFrEF: Patient appears grossy euvolemic.  Diuretics on hold.  Okay to hold one more day; would consider restarting home regimen of torsemide 40 mg daily tomorrow or at discharge.  Transition metoprolol tartrate to metoprolol succinate 25 mg daily.  Continue digoxin 125 mcg daily.  Defer adding ACEI/ARB given hypotension.  Ideally, would wean midodrine if possible to minimize afterload in the setting  of severely reduced LVEF.  Persistent atrial fibrillation:  Transition to metoprolol succinate 25 mg daily.  Continue digoxin 125 mcg daily.  Continue rivaroxaban as long as no bleeding issues arise.  Coronary artery disease: No angina reported, but given h/o CAD and decline in LVEF, worsening ischemic cardiomyopathy a concern.  Continue statin therapy.  In light of anemia and bleeding concerns, I will stop aspirin now that the patient is back on rivaroxaban.  Hypotension: Chronic and not unexpected in the setting of severely reduced LVEF and other comorbidities.  Minimize midodrine, as tolerated.  Sacral decubitus ulcer and osteomyelitis: Continue wound vac and management per surgery, IM, and ID.  As the patient will likely be d/c'ed today, we will make arrangements for f/u with Dr. Rockey Situ or APP in 1-2 weeks.  For questions or updates, please contact [place] Please consult [email or web address] for contact info under [facility]  Cardiology.     Signed, Nelva Bush, MD  07/09/2019, 10:52 AM

## 2019-07-09 NOTE — Care Management Important Message (Signed)
Important Message  Patient Details  Name: Colton Lamb MRN: [id number] Date of Birth: 1950/11/17   Medicare Important Message Given:  Yes     Juliann Pulse A Akyah Lagrange 07/09/2019, 11:36 AM

## 2019-07-09 NOTE — Discharge Summary (Addendum)
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 Atrial flutter ([place])    a. s/p Cardioversion 11/22/13, on amiodarone and Xarelto.  . CHF (congestive heart failure) ([place])   . Chronic osteomyelitis ([place]) 06/30/2019  . Chronic systolic heart failure ([place])    a. 10/2013 EF 20-25%, grade III DD, RV mildly dilated and sys fx mild/mod reduced;  b. 01/2014 Echo: EF 30-35%, gr3 DD, mod dil LA.  Marland Kitchen Coronary artery disease    a. s/p MI 2007/2015;  b. s/p prior PCI to the LAD/LCX/PDA/PL;  c. 2008: s/p Cypher DES to the OM.  Marland Kitchen Crohn's ileocolitis ([place])   . GERD (gastroesophageal reflux disease)   . Hx of adenomatous colonic polyps 11/2003  . Hyperlipidemia   . Hypertension   . Ischemic cardiomyopathy    s. 01/2014 s/p MDT [id number] [name] XT DR single lead AICD.  Marland Kitchen Obesity   . Paroxysmal atrial fibrillation (HCC)    a. CHA2DS2VASc = 4-->xarelto/amio.  . Sleep apnea   . Syncope    a.  11/2013 in setting of volume depletion and  bradycardia due to dig toxicity   . Type II diabetes mellitus [facility])     HOSPITAL COURSE:  67 year old man PMH systolic CHF, Crohn's disease presented with shortness of breath. Treated with BiPAP, Lasix and admitted for acute CHF exacerbation.  1. Acute on chronic diastolic/systolic congestive heart failure with severe cardiomyopathy EF of 20 to 25% -appears you will awake.  -home meds  now reviewed and resumed by Cardiology Torsemid, Toprol XL, digoxin -cardiology recommends right and left heart catheterization however this is deferred until volume status is optimized and sacral decubitus is healed [facility] cardiology following pt--appreciate input -patient currently off BIPAP--on 3 liter [facility] -weaned off levophed since MAP>65. Patient usually runs low blood pressure SBP 90-100  2. Stage IV sacral decubitus present on admission with chronic osteomyelitis -status post wound vac placement of the buttock--dressing changed MWF per wound nurse -diverting loop colostomy POD # 6 to help with wound healing-- by Dr. Christian Mate -on IV  Zosyn (end date 07/24/2019 per ID) via PICC line -Foley catheter  placed to avoid soiling of his Chronic decubitus and pt has h/o Urinary rentention. Patient had Foley before as well. -Patient will follow-up with Dr. Christian Mate in one week for his diabetic look colostomy -he has the expected leak around the colostomy which will require frequent change.  3. Hypotension postop with cardiomyopathy--BP improving.  -off IV pressers--wean off -Patient's blood pressure stays usually on the lower side  4. History of chronic atrial fibrillation -rate controlled -on hold digoxin Toprol-XL -resume  Xarelto from 07/09/2019 pm  5. Diabetes type II, controlled -continue sliding scale insulin. -d/c Levemir-- if sugar rises then nursing home MD can start Levemir -sugars appears stable  6. DVT prophylaxis xarelto from tonite  7. Code status-- DNR. Patient and family  met  with palliative care and goals of care were discussed.  Discharge back to his facility today. Ok from Cardiology and Surgery standpoint to d/c Repeat, COVID negative  Family communication : sister-in-law is aware of the plan Consults : surgery, ID,Cardiology, Palliative care [facility] CODE STATUS: DNR  [facility] VAC, Foley, PICC line, colostomy.  He will follow-up with surgery, cardiology and primary care physician  CONSULTS OBTAINED:  Treatment Team:  Kate Sable, MD Ronny Bacon, MD Ottie Glazier, MD  DRUG ALLERGIES:   Allergies  Allergen Reactions  . Iodine Other (See Comments)    Shortness of breath, swelling and hives  . Shrimp [Shellfish Allergy] Other (See Comments)    SWELLING    HIVES    SHORTNESS OF BREATH  . Tetracycline Rash    DISCHARGE MEDICATIONS:   Allergies as of 07/09/2019      Reactions   Iodine Other (See Comments)   Shortness of breath, swelling and hives   Shrimp [shellfish Allergy] Other (See Comments)   SWELLING    HIVES    SHORTNESS OF BREATH   Tetracycline Rash      Medication List    STOP taking these medications   insulin detemir 100 UNIT/ML injection Commonly known as: LEVEMIR   sodium hypochlorite external solution Commonly known as: DAKIN'S 1/2 STRENGTH     TAKE these medications   acetaminophen 325 MG tablet Commonly known as: TYLENOL Take 1-2 tablets (325-650 mg total) by mouth every 4 (four) hours as needed for mild pain.   Albuterol Sulfate 108 (90 Base) MCG/ACT Aepb Inhale 1 puff into the lungs every 6 (six) hours as needed (shortness of breath).   ascorbic acid 500 MG tablet Commonly known as: VITAMIN C Take 1 tablet (500 mg total) by mouth 2 (two) times daily.   aspirin 81 MG tablet Take 81 mg by mouth daily.   atorvastatin 80 MG tablet Commonly known as: LIPITOR TAKE 1 TABLET BY MOUTH EVERY DAY   balsalazide 750 MG capsule Commonly  known as: COLAZAL TAKE 1 CAPSULE (750 MG TOTAL) BY MOUTH 3 (THREE) TIMES DAILY. What changed:   how much to take  how to take this  when to take this  additional instructions   DECUBI-VITE PO Take by mouth daily.   dextromethorphan-guaiFENesin 10-100 MG/5ML liquid Commonly known as: ROBITUSSIN-DM Take 10 mLs by mouth every 4 (four) hours as needed for cough. What changed: Another medication with the same name was removed. Continue taking this medication, and follow the directions you see here.   digoxin 0.125 MG tablet Commonly known as: LANOXIN Take 1 tablet (0.125 mg total) by mouth daily. Start taking on: July 10, 2019   feeding supplement (NEPRO CARB STEADY) Liqd Take 237 mLs by mouth 2 (two) times daily between meals.   finasteride 5 MG tablet Commonly known as: PROSCAR Take 1 tablet (5 mg total) by mouth daily.   HYDROcodone-acetaminophen 5-325 MG tablet Commonly known as: NORCO/VICODIN Take 1 tablet by mouth every 6 (six) hours as needed for severe pain.   insulin aspart 100 UNIT/ML injection Commonly known as: novoLOG Inject 5 Units into the skin 3 (three) times daily with meals. What changed:  when to take this   lactobacillus Pack Take 1 packet (1 g total) by mouth 3 (three) times daily with meals.   metoprolol succinate 25 MG 24 hr tablet Commonly known as: TOPROL-XL Take 1 tablet (25 mg total) by mouth daily. Start taking on: July 10, 2019   midodrine 10 MG tablet Commonly known as: PROAMATINE Take 1 tablet (10 mg total) by mouth 3 (three) times daily.   niacin 1000 MG CR tablet Commonly known as: NIASPAN Take 1,000 mg by mouth daily.   omeprazole 20 MG capsule Commonly known as: PRILOSEC Take 20 mg by mouth daily.   piperacillin-tazobactam 3.375 GM/50ML IVPB Commonly known as: ZOSYN Inject 50 mLs (3.375 g total) into the vein every 6 (six) hours for 15 days.   potassium chloride 10 MEQ tablet Commonly known as: KLOR-CON Take 1  tablet (10 mEq total) by mouth daily.   tamsulosin 0.4 MG Caps capsule Commonly known as: FLOMAX Take 1 capsule (0.4 mg total) by mouth daily after breakfast.   torsemide 20 MG tablet Commonly known as: DEMADEX Take 2 tablets (40 mg) by mouth once daily   Xarelto 20 MG Tabs tablet Generic drug: rivaroxaban TAKE 1 TABLET BY MOUTH EVERY DAY WITH LUNCH       If you experience worsening of your admission symptoms, develop shortness of breath, life threatening emergency, suicidal or homicidal thoughts you must seek medical attention immediately by calling 911 or calling your MD immediately  if symptoms less severe.  You Must read complete instructions/literature along with all the possible adverse reactions/side effects for all the Medicines you take and that have been prescribed to you. Take any new Medicines after you have completely understood and accept all the possible adverse reactions/side effects.   Please note  You were cared for by a hospitalist during your hospital stay. If you have any questions about your discharge medications or the care you received while you were in the hospital after you are discharged, you can call the unit and asked to speak with the hospitalist on call if the hospitalist that took care of you is not available. Once you are discharged, your primary care physician will handle any further medical issues. Please note that NO REFILLS for any discharge medications will be authorized once you are discharged, as it is imperative that you return to your primary care physician (or establish a relationship with a primary care physician if you do not have one) for your aftercare needs so that they can reassess your need for medications and monitor your lab values. Today   SUBJECTIVE   No new complaints eating soft diet  VITAL SIGNS:  Blood pressure 108/72, pulse 94, temperature (!) 97.5 F (36.4 C), resp. rate 18, height 6' (1.829 m), weight 108 kg, SpO2 94  %.  I/O:    Intake/Output Summary (Last 24 hours) at 07/09/2019 1222 Last data filed at 07/09/2019 0740 Gross per 24 hour  Intake 289.57 ml  Output 2075 ml  Net -1785.43 ml    PHYSICAL EXAMINATION:  GENERAL:  68 y.o.-year-old patient lying in the bed with mild acute distress. Chronically ill and on [place] oxygen EYES: Pupils equal, round, reactive to light and accommodation. No scleral icterus.  HEENT: Head atraumatic, normocephalic. Oropharynx and nasopharynx clear.  NECK:  Supple, no jugular venous distention. No thyroid enlargement, no tenderness.  LUNGS: Normal breath sounds bilaterally, no wheezing, rales, rhonchi. No use of accessory muscles of respiration.  CARDIOVASCULAR: S1, S2 normal. No murmurs, rubs,  or gallops.  ABDOMEN: Soft, nontender, nondistended. Bowel sounds present. No organomegaly or mass. Diverting loop  Colostomy+, Foley+ EXTREMITIES:++ edema b/l --chronic, PICC line + right arm NEUROLOGIC: Cranial nerves II through XII are intact. No focal Motor or sensory deficits b/l.   PSYCHIATRIC:  patient is alert and oriented x 3.  SKIN: wound VAC on the buttocks   DATA REVIEW:   CBC  Recent Labs  Lab 07/07/19 0441  WBC 11.5*  HGB 9.5*  HCT 32.7*  PLT 213    Chemistries  Recent Labs  Lab 07/09/19 0527  NA 140  K 3.8  CL 102  CO2 31  GLUCOSE 104*  BUN 16  CREATININE 0.79  CALCIUM 8.7*  MG 1.9    Microbiology Results   Recent Results (from the past 240 hour(s))  SARS CORONAVIRUS 2 (TAT 6-24 HRS) Nasopharyngeal Nasopharyngeal Swab     Status: None   Collection Time: 07/08/19  2:04 PM   Specimen: Nasopharyngeal Swab  Result Value Ref Range Status   SARS Coronavirus 2 NEGATIVE NEGATIVE Final    Comment: (NOTE) SARS-CoV-2 target nucleic acids are NOT DETECTED. The SARS-CoV-2 RNA is generally detectable in upper and lower respiratory specimens during the acute phase of infection. Negative results do not preclude SARS-CoV-2 infection, do not rule  out co-infections with other pathogens, and should not be used as the sole basis for treatment or other patient management decisions. Negative results must be combined with clinical observations, patient history, and epidemiological information. The expected result is Negative. Fact Sheet for Patients: SugarRoll.be Fact Sheet for Healthcare Providers: https://www.woods-mathews.com/ This test is not yet approved or cleared by the [place] FDA and  has been authorized for detection and/or diagnosis of SARS-CoV-2 by FDA under an Emergency Use Authorization (EUA). This EUA will remain  in effect (meaning this test can be used) for the duration of the COVID-19 declaration under Section 56 4(b)(1) of the Act, 21 U.S.C. section 360bbb-3(b)(1), unless the authorization is terminated or revoked sooner. Performed at [facility], [place] [address] [place], [place] [address]     RADIOLOGY:  No results found.   CODE STATUS:     Code Status Orders  (From admission, onward)         Start     Ordered   07/04/19 1137  Do not attempt resuscitation (DNR)  Continuous    Question Answer Comment  In the event of cardiac or respiratory ARREST Do not call a "code blue"   In the event of cardiac or respiratory ARREST Do not perform Intubation, CPR, defibrillation or ACLS   In the event of cardiac or respiratory ARREST Use medication by any route, position, wound care, and other measures to relive pain and suffering. May use oxygen, suction and manual treatment of airway obstruction as needed for comfort.      07/04/19 1136        Code Status History    Date Active Date Inactive Code Status Order ID Comments User Context   06/12/2019 0359 07/04/2019 1136 Full Code [id number]  Sidney Ace Arvella Merles, MD ED   04/21/2019 1857 04/26/2019 2045 Full Code [id number]  Demetrios Loll, MD Inpatient   03/04/2019 2021 03/19/2019 2042 Full Code [id number]  [name], MD  Inpatient   09/07/2016 0100 09/09/2016 2001 Full Code [id number]  Harvie Bridge, DO Inpatient   04/16/2014 1552 04/17/2014 1512 Full Code [id number]  Evans Lance, MD Inpatient   12/11/2013 2042 12/14/2013 1516 Full Code [id number]  Crista Luria Inpatient   11/22/2013 1434 11/30/2013 2000 Full Code [id number]  Belva Crome, MD Inpatient   11/22/2013 [phone] 11/22/2013 1434 Full Code [id number]  Dorothy Spark, MD Inpatient   11/22/2013 [id number] 11/22/2013 0916 Full Code [id number]  Barrett, Felisa Bonier Inpatient   Advance Care Planning Activity    Advance Directive Documentation     Most Recent Value  Type of Advance Directive  Healthcare Power of [place], Living will  Pre-existing out of facility DNR order (yellow form or pink MOST form)  -  "MOST" Form in Place?  -     TOTAL TIME TAKING CARE OF THIS PATIENT: *40* minutes.    [name] M.D on 07/09/2019 at 12:22 PM  Between 7am to 6pm - Pager - [phone] After 6pm go to [email or web address] - password [email or web address]  [facility]    CC: Primary care physician; [name], MD

## 2019-07-09 NOTE — Progress Notes (Signed)
Pt being discharge to [facility], report called to Maudie Mercury, reported to started xarelto tonight around 9pm per surgery, also explained the colostomy with bridge support to [employer], states understanding, aware that pt passing liquid stool and some clots with some bleeding, surgeon PA aware/assessed pt and states that this is to be expected, pt with no complaints

## 2019-07-11 ENCOUNTER — Other Ambulatory Visit: Payer: Self-pay

## 2019-07-11 ENCOUNTER — Inpatient Hospital Stay
Admission: EM | Admit: 2019-07-11 | Discharge: 2019-07-18 | DRG: 377 | Disposition: A | Discharge status: Skilled Nursing Facility | Payer: Medicare Other | Source: Skilled Nursing Facility | Attending: Surgery | Admitting: Surgery

## 2019-07-11 DIAGNOSIS — Z8249 Family history of ischemic heart disease and other diseases of the circulatory system: Secondary | ICD-10-CM

## 2019-07-11 DIAGNOSIS — L89154 Pressure ulcer of sacral region, stage 4: Secondary | ICD-10-CM | POA: Diagnosis present

## 2019-07-11 DIAGNOSIS — R58 Hemorrhage, not elsewhere classified: Secondary | ICD-10-CM | POA: Diagnosis present

## 2019-07-11 DIAGNOSIS — E1169 Type 2 diabetes mellitus with other specified complication: Secondary | ICD-10-CM | POA: Diagnosis present

## 2019-07-11 DIAGNOSIS — I480 Paroxysmal atrial fibrillation: Secondary | ICD-10-CM | POA: Diagnosis present

## 2019-07-11 DIAGNOSIS — K922 Gastrointestinal hemorrhage, unspecified: Secondary | ICD-10-CM

## 2019-07-11 DIAGNOSIS — I2510 Atherosclerotic heart disease of native coronary artery without angina pectoris: Secondary | ICD-10-CM | POA: Diagnosis present

## 2019-07-11 DIAGNOSIS — D6832 Hemorrhagic disorder due to extrinsic circulating anticoagulants: Secondary | ICD-10-CM | POA: Diagnosis present

## 2019-07-11 DIAGNOSIS — Z794 Long term (current) use of insulin: Secondary | ICD-10-CM

## 2019-07-11 DIAGNOSIS — Z6834 Body mass index (BMI) 34.0-34.9, adult: Secondary | ICD-10-CM

## 2019-07-11 DIAGNOSIS — Z95810 Presence of automatic (implantable) cardiac defibrillator: Secondary | ICD-10-CM

## 2019-07-11 DIAGNOSIS — T45515A Adverse effect of anticoagulants, initial encounter: Secondary | ICD-10-CM | POA: Diagnosis present

## 2019-07-11 DIAGNOSIS — Z803 Family history of malignant neoplasm of breast: Secondary | ICD-10-CM

## 2019-07-11 DIAGNOSIS — Z7982 Long term (current) use of aspirin: Secondary | ICD-10-CM

## 2019-07-11 DIAGNOSIS — E785 Hyperlipidemia, unspecified: Secondary | ICD-10-CM | POA: Diagnosis present

## 2019-07-11 DIAGNOSIS — K5080 Crohn's disease of both small and large intestine without complications: Secondary | ICD-10-CM | POA: Diagnosis present

## 2019-07-11 DIAGNOSIS — M4628 Osteomyelitis of vertebra, sacral and sacrococcygeal region: Secondary | ICD-10-CM | POA: Diagnosis present

## 2019-07-11 DIAGNOSIS — K219 Gastro-esophageal reflux disease without esophagitis: Secondary | ICD-10-CM | POA: Diagnosis present

## 2019-07-11 DIAGNOSIS — I5022 Chronic systolic (congestive) heart failure: Secondary | ICD-10-CM | POA: Diagnosis present

## 2019-07-11 DIAGNOSIS — K921 Melena: Secondary | ICD-10-CM | POA: Diagnosis not present

## 2019-07-11 DIAGNOSIS — Y92129 Unspecified place in nursing home as the place of occurrence of the external cause: Secondary | ICD-10-CM

## 2019-07-11 DIAGNOSIS — I110 Hypertensive heart disease with heart failure: Secondary | ICD-10-CM | POA: Diagnosis present

## 2019-07-11 DIAGNOSIS — E669 Obesity, unspecified: Secondary | ICD-10-CM | POA: Diagnosis present

## 2019-07-11 DIAGNOSIS — Z933 Colostomy status: Secondary | ICD-10-CM

## 2019-07-11 DIAGNOSIS — Z7901 Long term (current) use of anticoagulants: Secondary | ICD-10-CM

## 2019-07-11 DIAGNOSIS — D509 Iron deficiency anemia, unspecified: Secondary | ICD-10-CM | POA: Diagnosis present

## 2019-07-11 DIAGNOSIS — Z95828 Presence of other vascular implants and grafts: Secondary | ICD-10-CM

## 2019-07-11 DIAGNOSIS — I252 Old myocardial infarction: Secondary | ICD-10-CM

## 2019-07-11 DIAGNOSIS — Z955 Presence of coronary angioplasty implant and graft: Secondary | ICD-10-CM

## 2019-07-11 DIAGNOSIS — Z20828 Contact with and (suspected) exposure to other viral communicable diseases: Secondary | ICD-10-CM | POA: Diagnosis present

## 2019-07-11 LAB — COMPREHENSIVE METABOLIC PANEL
ALT: 12 U/L (ref 0–44)
AST: 16 U/L (ref 15–41)
Albumin: 2.6 g/dL — ABNORMAL LOW (ref 3.5–5.0)
Alkaline Phosphatase: 102 U/L (ref 38–126)
Anion gap: 4 — ABNORMAL LOW (ref 5–15)
BUN: 13 mg/dL (ref 8–23)
CO2: 34 mmol/L — ABNORMAL HIGH (ref 22–32)
Calcium: 8.1 mg/dL — ABNORMAL LOW (ref 8.9–10.3)
Chloride: 103 mmol/L (ref 98–111)
Creatinine, Ser: 0.74 mg/dL (ref 0.61–1.24)
GFR calc Af Amer: >60 mL/min (ref >60)
GFR calc non Af Amer: >60 mL/min (ref >60)
Glucose, Bld: 144 mg/dL — ABNORMAL HIGH (ref 70–99)
Potassium: 3.4 mmol/L — ABNORMAL LOW (ref 3.5–5.1)
Sodium: 141 mmol/L (ref 135–145)
Total Bilirubin: 0.4 mg/dL (ref 0.3–1.2)
Total Protein: 5.7 g/dL — ABNORMAL LOW (ref 6.5–8.1)

## 2019-07-11 LAB — CBC WITH DIFFERENTIAL/PLATELET
Abs Immature Granulocytes: 0.06 10*3/uL (ref 0.00–0.07)
Basophils Absolute: 0.1 10*3/uL (ref 0.0–0.1)
Basophils Relative: 1 %
Eosinophils Absolute: 0.7 10*3/uL — ABNORMAL HIGH (ref 0.0–0.5)
Eosinophils Relative: 6 %
HCT: 28.5 % — ABNORMAL LOW (ref 39.0–52.0)
Hemoglobin: 8.7 g/dL — ABNORMAL LOW (ref 13.0–17.0)
Immature Granulocytes: 1 %
Lymphocytes Relative: 11 %
Lymphs Abs: 1.2 10*3/uL (ref 0.7–4.0)
MCH: 24.9 pg — ABNORMAL LOW (ref 26.0–34.0)
MCHC: 30.5 g/dL (ref 30.0–36.0)
MCV: 81.7 fL (ref 80.0–100.0)
Monocytes Absolute: 0.6 10*3/uL (ref 0.1–1.0)
Monocytes Relative: 5 %
Neutro Abs: 8.3 10*3/uL — ABNORMAL HIGH (ref 1.7–7.7)
Neutrophils Relative %: 76 %
Platelets: 197 10*3/uL (ref 150–400)
RBC: 3.49 MIL/uL — ABNORMAL LOW (ref 4.22–5.81)
RDW: 18.5 % — ABNORMAL HIGH (ref 11.5–15.5)
WBC: 10.8 10*3/uL — ABNORMAL HIGH (ref 4.0–10.5)
nRBC: 0 % (ref 0.0–0.2)

## 2019-07-11 LAB — CBC
HCT: 28.8 % — ABNORMAL LOW (ref 39.0–52.0)
Hemoglobin: 8.6 g/dL — ABNORMAL LOW (ref 13.0–17.0)
MCH: 25.1 pg — ABNORMAL LOW (ref 26.0–34.0)
MCHC: 29.9 g/dL — ABNORMAL LOW (ref 30.0–36.0)
MCV: 84 fL (ref 80.0–100.0)
Platelets: 216 10*3/uL (ref 150–400)
RBC: 3.43 MIL/uL — ABNORMAL LOW (ref 4.22–5.81)
RDW: 18.6 % — ABNORMAL HIGH (ref 11.5–15.5)
WBC: 10.8 10*3/uL — ABNORMAL HIGH (ref 4.0–10.5)
nRBC: 0 % (ref 0.0–0.2)

## 2019-07-11 LAB — PROTIME-INR
INR: 1.9 — ABNORMAL HIGH (ref 0.8–1.2)
Prothrombin Time: 21.5 seconds — ABNORMAL HIGH (ref 11.4–15.2)

## 2019-07-11 LAB — APTT: aPTT: 42 seconds — ABNORMAL HIGH (ref 24–36)

## 2019-07-11 LAB — TYPE AND SCREEN
ABO/RH(D): B POS
Antibody Screen: NEGATIVE

## 2019-07-11 MED ORDER — METOPROLOL SUCCINATE ER 50 MG PO TB24
25.0000 mg | ORAL_TABLET | Freq: Every day | ORAL | Status: DC
  Administered 2019-07-12 – 2019-07-18 (×5): 25 mg via ORAL
  Filled 2019-07-11 (×7): qty 1

## 2019-07-11 MED ORDER — ADULT MULTIVITAMIN W/MINERALS CH
ORAL_TABLET | Freq: Every day | ORAL | Status: DC
  Administered 2019-07-12 – 2019-07-18 (×7): 1 via ORAL
  Filled 2019-07-11 (×7): qty 1

## 2019-07-11 MED ORDER — PIPERACILLIN-TAZOBACTAM 3.375 G IVPB
3.3750 g | Freq: Three times a day (TID) | INTRAVENOUS | Status: DC
  Administered 2019-07-11 – 2019-07-18 (×20): 3.375 g via INTRAVENOUS
  Filled 2019-07-11 (×19): qty 50

## 2019-07-11 MED ORDER — PANTOPRAZOLE SODIUM 40 MG PO TBEC
40.0000 mg | DELAYED_RELEASE_TABLET | Freq: Every day | ORAL | Status: DC
  Administered 2019-07-12 – 2019-07-18 (×7): 40 mg via ORAL
  Filled 2019-07-11 (×7): qty 1

## 2019-07-11 MED ORDER — MIDODRINE HCL 5 MG PO TABS
10.0000 mg | ORAL_TABLET | Freq: Three times a day (TID) | ORAL | Status: DC
  Administered 2019-07-12 – 2019-07-18 (×19): 10 mg via ORAL
  Filled 2019-07-11 (×22): qty 2

## 2019-07-11 MED ORDER — ACETAMINOPHEN 325 MG PO TABS: 325.0000 mg | ORAL_TABLET | ORAL | Status: DC | PRN

## 2019-07-11 MED ORDER — VITAMIN C 500 MG PO TABS
500.0000 mg | ORAL_TABLET | Freq: Two times a day (BID) | ORAL | Status: DC
  Administered 2019-07-12 – 2019-07-18 (×13): 500 mg via ORAL
  Filled 2019-07-11 (×13): qty 1

## 2019-07-11 MED ORDER — TAMSULOSIN HCL 0.4 MG PO CAPS
0.4000 mg | ORAL_CAPSULE | Freq: Every day | ORAL | Status: DC
  Administered 2019-07-12 – 2019-07-18 (×7): 0.4 mg via ORAL
  Filled 2019-07-11 (×7): qty 1

## 2019-07-11 MED ORDER — ONDANSETRON HCL 4 MG/2ML IJ SOLN: 4.0000 mg | Freq: Four times a day (QID) | INTRAMUSCULAR | Status: DC | PRN

## 2019-07-11 MED ORDER — ONDANSETRON 4 MG PO TBDP: 4.0000 mg | ORAL_TABLET | Freq: Four times a day (QID) | ORAL | Status: DC | PRN

## 2019-07-11 MED ORDER — SIMETHICONE 80 MG PO CHEW: 40.0000 mg | CHEWABLE_TABLET | Freq: Four times a day (QID) | ORAL | Status: DC | PRN

## 2019-07-11 MED ORDER — DIGOXIN 125 MCG PO TABS
0.1250 mg | ORAL_TABLET | Freq: Every day | ORAL | Status: DC
  Administered 2019-07-12 – 2019-07-18 (×7): 0.125 mg via ORAL
  Filled 2019-07-11 (×7): qty 1

## 2019-07-11 MED ORDER — FINASTERIDE 5 MG PO TABS
5.0000 mg | ORAL_TABLET | Freq: Every day | ORAL | Status: DC
  Administered 2019-07-12 – 2019-07-18 (×7): 5 mg via ORAL
  Filled 2019-07-11 (×7): qty 1

## 2019-07-11 MED ORDER — INSULIN ASPART 100 UNIT/ML ~~LOC~~ SOLN
5.0000 [IU] | Freq: Three times a day (TID) | SUBCUTANEOUS | Status: DC
  Administered 2019-07-12 – 2019-07-18 (×16): 5 [IU] via SUBCUTANEOUS
  Filled 2019-07-11 (×19): qty 1

## 2019-07-11 MED ORDER — FLORANEX PO PACK
1.0000 g | PACK | Freq: Three times a day (TID) | ORAL | Status: DC
  Administered 2019-07-12 – 2019-07-18 (×11): 1 g via ORAL
  Filled 2019-07-11 (×28): qty 1

## 2019-07-11 MED ORDER — ALBUTEROL SULFATE (2.5 MG/3ML) 0.083% IN NEBU: 2.5000 mg | INHALATION_SOLUTION | Freq: Four times a day (QID) | RESPIRATORY_TRACT | Status: DC | PRN

## 2019-07-11 MED ORDER — KCL IN DEXTROSE-NACL 20-5-0.45 MEQ/L-%-% IV SOLN
INTRAVENOUS | Status: DC
  Administered 2019-07-11 – 2019-07-13 (×3): via INTRAVENOUS
  Filled 2019-07-11 (×5): qty 1000

## 2019-07-11 MED ORDER — HYDROCODONE-ACETAMINOPHEN 5-325 MG PO TABS
1.0000 | ORAL_TABLET | Freq: Four times a day (QID) | ORAL | Status: DC | PRN
  Filled 2019-07-11: qty 1

## 2019-07-11 MED ORDER — TORSEMIDE 20 MG PO TABS
40.0000 mg | ORAL_TABLET | Freq: Every day | ORAL | Status: DC
  Administered 2019-07-12 – 2019-07-18 (×7): 40 mg via ORAL
  Filled 2019-07-11 (×7): qty 2

## 2019-07-11 MED ORDER — NIACIN ER (ANTIHYPERLIPIDEMIC) 500 MG PO TBCR
1000.0000 mg | EXTENDED_RELEASE_TABLET | Freq: Every day | ORAL | Status: DC
  Administered 2019-07-12 – 2019-07-18 (×7): 1000 mg via ORAL
  Filled 2019-07-11 (×7): qty 2

## 2019-07-11 MED ORDER — ATORVASTATIN CALCIUM 20 MG PO TABS
80.0000 mg | ORAL_TABLET | Freq: Every day | ORAL | Status: DC
  Administered 2019-07-12 – 2019-07-17 (×6): 80 mg via ORAL
  Filled 2019-07-11 (×5): qty 4
  Filled 2019-07-11: qty 8
  Filled 2019-07-11: qty 4

## 2019-07-11 MED ORDER — POTASSIUM CHLORIDE CRYS ER 10 MEQ PO TBCR
10.0000 meq | EXTENDED_RELEASE_TABLET | Freq: Every day | ORAL | Status: DC
  Administered 2019-07-12 – 2019-07-18 (×7): 10 meq via ORAL
  Filled 2019-07-11 (×7): qty 1

## 2019-07-11 MED ORDER — BALSALAZIDE DISODIUM 750 MG PO CAPS: 750.0000 mg | ORAL_CAPSULE | Freq: Three times a day (TID) | ORAL | Status: DC

## 2019-07-11 NOTE — ED Notes (Signed)
PT given glass of water

## 2019-07-11 NOTE — Progress Notes (Signed)
This is a 67 y/o M s/p loop colostomy for fecal diversion from sacral decubityus ulcer.  Hx of a fib, on Xarelto.  Presented to ED with blood in ostomy bag.  On reading notes from his recent hospital stay, this also occurred immediately after the stoma was matured at bedside.  Per ED physician, mucosa is somewhat gray, but stoma is patent.  Suspect bleeding may be secondary to therapeutic anticoagulation.  Will bring in overnight and monitor H/H.  Suspect no surgical intervention will be indicated.  Will discuss with Dr. Christian Mate in the AM. Full H&P to follow.

## 2019-07-11 NOTE — ED Notes (Signed)
Patient present from NH with c/o of bleeding from his colostomy site. Colotomy bag full of bright red blood.

## 2019-07-11 NOTE — ED Triage Notes (Signed)
[facility] with blood in his colostomy bag. Patient asymptomatic. Colostomy placed 1 few days ago.

## 2019-07-11 NOTE — ED Notes (Signed)
IV team to bedside. 

## 2019-07-11 NOTE — ED Provider Notes (Addendum)
[facility] Emergency Department Provider Note   ____________________________________________   First MD Initiated Contact with Patient 07/11/19 1806     (approximate)  I have reviewed the triage vital signs and the nursing notes.   HISTORY  Chief Complaint Rectal Bleeding   HPI Colton Lamb is a 68 y.o. male who had a colostomy placed on 2 December to keep stool out of his sacral decubitus.  He comes in after someone noticed that his ostomy bag was full of blood.  His ostomy bag is full of about a pint of clotted blood.  It is the same color is venous blood.  The ostomy bag was taking taken off the ostomy itself does not appear to be bleeding it appears that the blood is coming out of the hole of the ostomy from the intestine.  The intestinal part of the ostomy however is gray and necrotic looking.  I am paging Dr. Vicente Males the gastroenterologist and the surgeon both.  Patient currently has a slow leak out of his ostomy and we are going to replace the bag with a fresh bag.         Past Medical History:  Diagnosis Date  . Atrial flutter ([place])    a. s/p Cardioversion 11/22/13, on amiodarone and Xarelto.  . CHF (congestive heart failure) ([place])   . Chronic osteomyelitis ([place]) 06/30/2019  . Chronic systolic heart failure ([place])    a. 10/2013 EF 20-25%, grade III DD, RV mildly dilated and sys fx mild/mod reduced;  b. 01/2014 Echo: EF 30-35%, gr3 DD, mod dil LA.  Marland Kitchen Coronary artery disease    a. s/p MI 2007/2015;  b. s/p prior PCI to the LAD/LCX/PDA/PL;  c. 2008: s/p Cypher DES to the OM.  Marland Kitchen Crohn's ileocolitis ([place])   . GERD (gastroesophageal reflux disease)   . Hx of adenomatous colonic polyps 11/2003  . Hyperlipidemia   . Hypertension   . Ischemic cardiomyopathy    s. 01/2014 s/p MDT [id number] [name] XT DR single lead AICD.  Marland Kitchen Obesity   . Paroxysmal atrial fibrillation (HCC)    a. CHA2DS2VASc = 4-->xarelto/amio.  . Sleep apnea   . Syncope    a.  11/2013 in  setting of volume depletion and bradycardia due to dig toxicity   . Type II diabetes mellitus [facility])     Patient Active Problem List   Diagnosis Date Noted  . Chronic osteomyelitis ([place]) 06/30/2019  . PICC (peripherally inserted central catheter) flush   . Normocytic anemia 06/21/2019  . 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 Hypotension 06/19/2019  . Anemia   . Acute respiratory failure ([place]) 06/12/2019  . Acute on chronic congestive heart failure ([place])   . Acute blood loss anemia   . Pressure injury of skin 03/17/2019  . Acute renal failure (ARF) ([place])   . Empyema lung ([place])   . Shortness of breath   . Pleural effusion on right   . Sepsis ([place]) 03/04/2019  . Restless legs syndrome (RLS) 06/16/2017  . Hypertension, essential 06/16/2017  . CAD in native artery 04/27/2017  . Hydrocephalus ([place]) 09/08/2016  . Dizziness and giddiness   . Elevated troponin 09/06/2016  . Type II diabetes mellitus ([place])   . Ischemic cardiomyopathy   . Hyponatremia 07/01/2015  . Diabetes mellitus type 2, uncontrolled, with complications ([place]) 32/95/1884  . 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 OSA (obstructive sleep apnea) 12/20/2013  . Morbid obesity ([place]) 12/20/2013  .  AKI (acute kidney injury) - Creatinine improved at d/c 12/14/2013  . Elevated TSH - will need f/u TFTs with PCP in 3-4 weeks 12/14/2013  . Junctional bradycardia - resolved 12/14/2013  . Syncope - due to bradycardia in setting of Digoxin Toxicity 12/11/2013  . Acute on chronic HFrEF (heart failure with reduced ejection fraction) ([place]) 12/06/2013  . At risk for sudden cardiac death - on LifeVest 12-06-13  . Acute on chronic combined systolic and diastolic CHF (congestive heart failure) ([place]) Dec 06, 2013  . Cardiomyopathy, ischemic 06-Dec-2013  . Atrial flutter ([place]) 11/22/2013  . NSTEMI (non-ST elevated myocardial infarction) ([place]) 11/22/2013  . Long term current use of  anticoagulant 10/19/2010  . Hyperlipidemia 05/06/2010  . Edema 05/06/2010  . CAD S/P percutaneous coronary angioplasty - multiple PCIs 11/11/2008  . Atrial fibrillation ([place]) 11/11/2008  . GERD 11/11/2008  . [place] INTESTINE 11/11/2008  . COLONIC POLYPS, HX OF 11/11/2008  . [place] ALLERGY 11/11/2008    Past Surgical History:  Procedure Laterality Date  . ATRIAL FLUTTER ABLATION N/A 04/16/2014   Procedure: ATRIAL FLUTTER ABLATION;  Surgeon: Evans Lance, MD;  Location: [facility] CATH LAB;  Service: Cardiovascular;  Laterality: N/A;  . CARDIAC CATHETERIZATION  10/2013  . CARDIAC DEFIBRILLATOR PLACEMENT  04/16/2014   Medtronic Evira device  . CARDIAC ELECTROPHYSIOLOGY STUDY AND ABLATION  04/16/2014   atrial flutter ablation  . CARDIOVERSION N/A 03/05/2014   Procedure: CARDIOVERSION;  Surgeon: Jolaine Artist, MD;  Location[facility] ENDOSCOPY;  Service: Cardiovascular;  Laterality: N/A;  . CATARACT EXTRACTION W/PHACO Right 01/04/2017   Procedure: CATARACT EXTRACTION PHACO AND INTRAOCULAR LENS PLACEMENT ([place])  Right Diabetic Complicated;  Surgeon: Leandrew Koyanagi, MD;  Location: [place];  Service: Ophthalmology;  Laterality: Right;  Diabetic  . CATARACT EXTRACTION W/PHACO Left 02/08/2017   Procedure: CATARACT EXTRACTION PHACO AND INTRAOCULAR LENS PLACEMENT ([place]) left diabetic;  Surgeon: Leandrew Koyanagi, MD;  Location: [place];  Service: Ophthalmology;  Laterality: Left;  Diabetic - oral meds sleep apnea  . CORONARY ANGIOPLASTY WITH STENT PLACEMENT  2007; 2008 X 2   "1+1 ~ 1"  . FOOT SURGERY Left    bone spur  . HYDROCELE EXCISION Bilateral   . 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 IMPLANTABLE CARDIOVERTER DEFIBRILLATOR IMPLANT N/A 04/16/2014   Procedure: IMPLANTABLE CARDIOVERTER DEFIBRILLATOR IMPLANT;  Surgeon: Evans Lance, MD;  Location[facility] CATH LAB;  Service: Cardiovascular;  Laterality: N/A;  . LEFT HEART  CATHETERIZATION WITH CORONARY ANGIOGRAM N/A 11/22/2013   Procedure: LEFT HEART CATHETERIZATION WITH CORONARY ANGIOGRAM;  Surgeon: Sinclair Grooms, MD;  Location[facility] C[facility] LAB;  Service: Cardiovascular;  Laterality: N/A;  . TRANSVERSE LOOP COLOSTOMY N/A 07/03/2019   Procedure: TRANSVERSE LOOP COLOSTOMY;  Surgeon: Ronny Bacon, MD;  Location: [facility] ORS;  Service: General;  Laterality: N/A;    Prior to Admission medications   Medication Sig Start Date End Date Taking? Authorizing Provider  acetaminophen (TYLENOL) 325 MG tablet Take 1-2 tablets (325-650 mg total) by mouth every 4 (four) hours as needed for mild pain. 04/17/14   [name], NP  Albuterol Sulfate 108 (90 Base) MCG/ACT AEPB Inhale 1 puff into the lungs every 6 (six) hours as needed (shortness of breath).     [provider]  aspirin 81 MG tablet Take 81 mg by mouth daily.     [provider]  atorvastatin (LIPITOR) 80 MG tablet TAKE 1 TABLET BY MOUTH EVERY DAY Patient taking differently: Take  80 mg by mouth daily.  03/08/19   Minna Merritts, MD  balsalazide (COLAZAL) 750 MG capsule TAKE 1 CAPSULE (750 MG TOTAL) BY MOUTH 3 (THREE) TIMES DAILY. Patient taking differently: Take 750 mg by mouth 3 (three) times daily.  11/28/18   Ladene Artist, MD  dextromethorphan-guaiFENesin (ROBITUSSIN-DM) 10-100 MG/5ML liquid Take 10 mLs by mouth every 4 (four) hours as needed for cough.    [provider]  digoxin (LANOXIN) 0.125 MG tablet Take 1 tablet (0.125 mg total) by mouth daily. 07/10/19   [name], MD  finasteride (PROSCAR) 5 MG tablet Take 1 tablet (5 mg total) by mouth daily. 03/20/19   Loletha Grayer, MD  HYDROcodone-acetaminophen (NORCO/VICODIN) 5-325 MG tablet Take 1 tablet by mouth every 6 (six) hours as needed for severe pain. 07/09/19   [name], MD  insulin aspart (NOVOLOG) 100 UNIT/ML injection Inject 5 Units into the skin 3 (three) times daily with meals. Patient taking differently: Inject 5  Units into the skin 3 (three) times daily before meals.  03/19/19   Loletha Grayer, MD  lactobacillus (FLORANEX/LACTINEX) PACK Take 1 packet (1 g total) by mouth 3 (three) times daily with meals. 03/19/19   Loletha Grayer, MD  metoprolol succinate (TOPROL-XL) 25 MG 24 hr tablet Take 1 tablet (25 mg total) by mouth daily. 07/10/19   [name], MD  midodrine (PROAMATINE) 10 MG tablet Take 1 tablet (10 mg total) by mouth 3 (three) times daily. 04/26/19   Max Sane, MD  Multiple Vitamins-Minerals (DECUBI-VITE PO) Take by mouth daily.    [provider]  niacin (NIASPAN) 1000 MG CR tablet Take 1,000 mg by mouth daily. 04/10/19   [provider]  Nutritional Supplements (FEEDING SUPPLEMENT, NEPRO CARB STEADY,) LIQD Take 237 mLs by mouth 2 (two) times daily between meals. 03/19/19   Loletha Grayer, MD  omeprazole (PRILOSEC) 20 MG capsule Take 20 mg by mouth daily.     [provider]  piperacillin-tazobactam (ZOSYN) 3.375 GM/50ML IVPB Inject 50 mLs (3.375 g total) into the vein every 6 (six) hours for 15 days. 07/09/19 07/24/19  [name], MD  potassium chloride (KLOR-CON) 10 MEQ tablet Take 1 tablet (10 mEq total) by mouth daily. 05/08/19 08/06/19  Minna Merritts, MD  tamsulosin (FLOMAX) 0.4 MG CAPS capsule Take 1 capsule (0.4 mg total) by mouth daily after breakfast. 03/20/19   Loletha Grayer, MD  torsemide (DEMADEX) 20 MG tablet Take 2 tablets (40 mg) by mouth once daily 05/08/19   Minna Merritts, MD  vitamin C (VITAMIN C) 500 MG tablet Take 1 tablet (500 mg total) by mouth 2 (two) times daily. 07/09/19   [name], MD  XARELTO 20 MG TABS tablet TAKE 1 TABLET BY MOUTH EVERY DAY WITH LUNCH 05/17/19   Minna Merritts, MD    Allergies Iodine, Shrimp [shellfish allergy], and Tetracycline  Family History  Problem Relation Age of Onset  . Breast cancer Mother   . Heart disease Father   . Heart attack Father   . Colon cancer Neg Hx     Social History Social  History   Tobacco Use  . Smoking status: Never Smoker  . Smokeless tobacco: Never Used  Substance Use Topics  . Alcohol use: No  . Drug use: No    Review of Systems  Constitutional: No fever/chills Eyes: No visual changes. ENT: No sore throat. Cardiovascular: Denies chest pain. Respiratory: Denies shortness of breath. Gastrointestinal: No abdominal pain.  No nausea, no vomiting.  No diarrhea.  No constipation. Genitourinary: Negative for dysuria. Musculoskeletal: Negative for back pain. Skin: Negative for rash. Neurological: Negative for headaches, focal weakness  ____________________________________________   PHYSICAL EXAM:  VITAL SIGNS: ED Triage Vitals  Enc Vitals Group     BP 07/11/19 1811 (!) 105/59     Pulse Rate 07/11/19 1811 82     Resp 07/11/19 1811 17     Temp 07/11/19 1811 97.6 F (36.4 C)     Temp Source 07/11/19 1811 Oral     SpO2 07/11/19 1810 100 %     Weight 07/11/19 1812 237 lb (107.5 kg)     Height 07/11/19 1812 5' 11"  (1.803 m)     Head Circumference --      [facility] Flow --      Pain Score 07/11/19 1812 0     Pain Loc --      Pain Edu? --      Excl. in [place]? --     Constitutional: Alert and oriented. Well appearing and in no acute distress. Eyes: Conjunctivae are normal.  Head: Atraumatic. Nose: No congestion/rhinnorhea. Mouth/Throat: Mucous membranes are moist.  Oropharynx non-erythematous. Neck: No stridor.  Cardiovascular: Normal rate, regular rhythm. Grossly normal heart sounds.  Good peripheral circulation. Respiratory: Normal respiratory effort.  No retractions. Lungs CTAB. Gastrointestinal: Soft and nontender. No distention. No abdominal bruits. No CVA tenderness.  Patient's ostomies on the left side of the abdomen the left lower quadrant as described in HPI Musculoskeletal: No lower extremity tenderness   Neurologic:  Normal speech and language. No gross focal neurologic deficits are appreciated.  Skin:  Skin is warm, dry and intact. No  rash noted.   ____________________________________________   LABS (all labs ordered are listed, but only abnormal results are displayed)  Labs Reviewed  CBC WITH DIFFERENTIAL/PLATELET - Abnormal; Notable for the following components:      Result Value   WBC 10.8 (*)    RBC 3.49 (*)    Hemoglobin 8.7 (*)    HCT 28.5 (*)    MCH 24.9 (*)    RDW 18.5 (*)    Neutro Abs 8.3 (*)    Eosinophils Absolute 0.7 (*)    All other components within normal limits  SARS CORONAVIRUS 2 (TAT 6-24 HRS)  COMPREHENSIVE METABOLIC PANEL  PROTIME-INR  APTT  TYPE AND SCREEN   ____________________________________________  EKG   ____________________________________________  RADIOLOGY  ED MD interpretation:    Official radiology report(s): No results found.  ____________________________________________   PROCEDURES  Procedure(s) performed (including Critical Care): Critical care time half an hour.  This includes discussing the patient with surgery and gastroenterology examining his ostomy and helping the nurse take off the ostomy bag draining ostomy bag and begin to apply the new one.  Also reviewing his old records took some time.  Procedures   ____________________________________________   INITIAL IMPRESSION / ASSESSMENT AND PLAN / ED COURSE  Discussed patient with Dr. Celine Ahr.  She is not worried about the grayness of the tissue of the ostomy.  The ostomy is patent by put on a little finger into it and there is no obstruction.  She feels the bleeding may be due to the Xarelto.  I am reviewing his MAR that came with him not sure that he is on it currently.  He thought that he was restarted on it.  Dr. Celine Ahr says that he did have some trouble with bleeding with this ostomy.  She will put him in the hospital and  watch him overnight.  He is typed and screened.  We can get emergency release blood if need be his blood pressure per Dr. Celine Ahr has been in the range it is now.  He is not  tachycardic and still feels fine.             ____________________________________________   FINAL CLINICAL IMPRESSION(S) / ED DIAGNOSES  Final diagnoses:  Gastrointestinal hemorrhage, unspecified gastrointestinal hemorrhage type     ED Discharge Orders    None       Note:  This document was prepared using Dragon voice recognition software and may include unintentional dictation errors.    Nena Polio, MD 07/11/19 1850 At this time labs are currently pending.  Dr. Celine Ahr is aware.   Nena Polio, MD 07/11/19 1850    Nena Polio, MD 07/11/19 2042

## 2019-07-11 NOTE — ED Notes (Signed)
Attempted blood draw/IV start x2, no success. IV team consulted regarding PICC line not flushing easily. Lab called for blood draw.

## 2019-07-11 NOTE — ED Notes (Signed)
Labs and type and screen sent

## 2019-07-12 DIAGNOSIS — Z8249 Family history of ischemic heart disease and other diseases of the circulatory system: Secondary | ICD-10-CM | POA: Diagnosis not present

## 2019-07-12 DIAGNOSIS — D509 Iron deficiency anemia, unspecified: Secondary | ICD-10-CM | POA: Diagnosis present

## 2019-07-12 DIAGNOSIS — Z955 Presence of coronary angioplasty implant and graft: Secondary | ICD-10-CM | POA: Diagnosis not present

## 2019-07-12 DIAGNOSIS — I2510 Atherosclerotic heart disease of native coronary artery without angina pectoris: Secondary | ICD-10-CM | POA: Diagnosis present

## 2019-07-12 DIAGNOSIS — Y92129 Unspecified place in nursing home as the place of occurrence of the external cause: Secondary | ICD-10-CM | POA: Diagnosis not present

## 2019-07-12 DIAGNOSIS — I110 Hypertensive heart disease with heart failure: Secondary | ICD-10-CM | POA: Diagnosis present

## 2019-07-12 DIAGNOSIS — I252 Old myocardial infarction: Secondary | ICD-10-CM | POA: Diagnosis not present

## 2019-07-12 DIAGNOSIS — R58 Hemorrhage, not elsewhere classified: Secondary | ICD-10-CM | POA: Diagnosis present

## 2019-07-12 DIAGNOSIS — L89154 Pressure ulcer of sacral region, stage 4: Secondary | ICD-10-CM | POA: Diagnosis present

## 2019-07-12 DIAGNOSIS — I480 Paroxysmal atrial fibrillation: Secondary | ICD-10-CM | POA: Diagnosis present

## 2019-07-12 DIAGNOSIS — D6832 Hemorrhagic disorder due to extrinsic circulating anticoagulants: Secondary | ICD-10-CM | POA: Diagnosis present

## 2019-07-12 DIAGNOSIS — Z6834 Body mass index (BMI) 34.0-34.9, adult: Secondary | ICD-10-CM | POA: Diagnosis not present

## 2019-07-12 DIAGNOSIS — Z20828 Contact with and (suspected) exposure to other viral communicable diseases: Secondary | ICD-10-CM | POA: Diagnosis present

## 2019-07-12 DIAGNOSIS — M4628 Osteomyelitis of vertebra, sacral and sacrococcygeal region: Secondary | ICD-10-CM | POA: Diagnosis present

## 2019-07-12 DIAGNOSIS — K219 Gastro-esophageal reflux disease without esophagitis: Secondary | ICD-10-CM | POA: Diagnosis present

## 2019-07-12 DIAGNOSIS — K921 Melena: Secondary | ICD-10-CM | POA: Diagnosis present

## 2019-07-12 DIAGNOSIS — Z933 Colostomy status: Secondary | ICD-10-CM | POA: Diagnosis not present

## 2019-07-12 DIAGNOSIS — K922 Gastrointestinal hemorrhage, unspecified: Secondary | ICD-10-CM | POA: Diagnosis not present

## 2019-07-12 DIAGNOSIS — Z7901 Long term (current) use of anticoagulants: Secondary | ICD-10-CM | POA: Diagnosis not present

## 2019-07-12 DIAGNOSIS — E785 Hyperlipidemia, unspecified: Secondary | ICD-10-CM | POA: Diagnosis present

## 2019-07-12 DIAGNOSIS — Z803 Family history of malignant neoplasm of breast: Secondary | ICD-10-CM | POA: Diagnosis not present

## 2019-07-12 DIAGNOSIS — I5022 Chronic systolic (congestive) heart failure: Secondary | ICD-10-CM | POA: Diagnosis present

## 2019-07-12 DIAGNOSIS — E1169 Type 2 diabetes mellitus with other specified complication: Secondary | ICD-10-CM | POA: Diagnosis present

## 2019-07-12 DIAGNOSIS — Z95810 Presence of automatic (implantable) cardiac defibrillator: Secondary | ICD-10-CM | POA: Diagnosis not present

## 2019-07-12 DIAGNOSIS — E669 Obesity, unspecified: Secondary | ICD-10-CM | POA: Diagnosis present

## 2019-07-12 DIAGNOSIS — T45515A Adverse effect of anticoagulants, initial encounter: Secondary | ICD-10-CM | POA: Diagnosis present

## 2019-07-12 DIAGNOSIS — K5080 Crohn's disease of both small and large intestine without complications: Secondary | ICD-10-CM | POA: Diagnosis present

## 2019-07-12 LAB — CBC
HCT: 26.7 % — ABNORMAL LOW (ref 39.0–52.0)
Hemoglobin: 8.2 g/dL — ABNORMAL LOW (ref 13.0–17.0)
MCH: 24.8 pg — ABNORMAL LOW (ref 26.0–34.0)
MCHC: 30.7 g/dL (ref 30.0–36.0)
MCV: 80.7 fL (ref 80.0–100.0)
Platelets: 182 10*3/uL (ref 150–400)
RBC: 3.31 MIL/uL — ABNORMAL LOW (ref 4.22–5.81)
RDW: 18.4 % — ABNORMAL HIGH (ref 11.5–15.5)
WBC: 9.3 10*3/uL (ref 4.0–10.5)
nRBC: 0 % (ref 0.0–0.2)

## 2019-07-12 LAB — SARS CORONAVIRUS 2 (TAT 6-24 HRS): SARS Coronavirus 2: NEGATIVE

## 2019-07-12 MED ORDER — CHLORHEXIDINE GLUCONATE CLOTH 2 % EX PADS
6.0000 | MEDICATED_PAD | Freq: Every day | CUTANEOUS | Status: DC
  Administered 2019-07-12 – 2019-07-18 (×7): 6 via TOPICAL

## 2019-07-12 NOTE — TOC Initial Note (Addendum)
Transition of Care [facility]) - Initial/Assessment Note    Patient Details  Name: Colton Lamb MRN: [id number] Date of Birth: 21-Mar-1951  [facility] CM/SW Contact:    Shade Flood, LCSW Phone Number: 07/12/2019, 4:28 PM  Clinical Narrative:                  Pt admitted from [facility]. He was recently hospitalized here and discharged to [facility]. Spoke with Otila Kluver at [facility] who states that pt does not have many Medicare days left out of his 100 days and that he has not been cooperative with starting a Medicaid application so they are not going to take him back. Pt has straight Medicare and does not need authorization. He will need to go to another facility at dc. Spoke with pt and he is agreeable to [facility] sending referrals out to other facilities.  TOC will follow and assist with dc planning.  Expected Discharge Plan: Skilled Nursing Facility Barriers to Discharge: Continued Medical Work up   Patient Goals and CMS Choice        Expected Discharge Plan and Services Expected Discharge Plan: [place] Acute Care Choice: Resumption of Svcs/PTA Provider Living arrangements for the past 2 months: [place]                                      Prior Living Arrangements/Services Living arrangements for the past 2 months: [place] Lives with:: Facility Resident Patient language and need for interpreter reviewed:: Yes        Need for Family Participation in Patient Care: No (Comment)     Criminal Activity/Legal Involvement Pertinent to Current Situation/Hospitalization: No - Comment as needed  Activities of Daily Living Home Assistive Devices/Equipment: Radio producer (specify quad or straight), Wheelchair ADL Screening (condition at time of admission) Patient's cognitive ability adequate to safely complete daily activities?: Yes Is the patient deaf or have difficulty hearing?: No Does the patient have difficulty seeing,  even when wearing glasses/contacts?: No Does the patient have difficulty concentrating, remembering, or making decisions?: No Patient able to express need for assistance with ADLs?: Yes Does the patient have difficulty dressing or bathing?: Yes Independently performs ADLs?: No Communication: Independent Dressing (OT): Needs assistance Is this a change from baseline?: Pre-admission baseline Grooming: Independent Feeding: Independent Bathing: Needs assistance Is this a change from baseline?: Pre-admission baseline Toileting: Needs assistance Is this a change from baseline?: Pre-admission baseline In/Out Bed: Needs assistance Is this a change from baseline?: Pre-admission baseline Walks in Home: Needs assistance Is this a change from baseline?: Pre-admission baseline Does the patient have difficulty walking or climbing stairs?: Yes Weakness of Legs: Both Weakness of Arms/Hands: Both  Permission Sought/Granted                  Emotional Assessment Appearance:: Appears stated age     Orientation: : Oriented to Self, Oriented to Place, Oriented to  Time, Oriented to Situation Alcohol / Substance Use: Not Applicable Psych Involvement: No (comment)  Admission diagnosis:  Bleeding [R58] Gastrointestinal hemorrhage, unspecified gastrointestinal hemorrhage type [K92.2] Patient Active Problem List   Diagnosis Date Noted  . Bleeding 07/11/2019  . Chronic osteomyelitis ([place]) 06/30/2019  . PICC (peripherally inserted central catheter) flush   . Normocytic anemia 06/21/2019  . Stage IV pressure ulcer of sacral region ([place]) 06/19/2019  . Hypotension 06/19/2019  .  Anemia   . Acute respiratory failure ([place]) 06/12/2019  . Acute on chronic congestive heart failure ([place])   . Acute blood loss anemia   . Pressure injury of skin 03/17/2019  . Acute renal failure (ARF) ([place])   . Empyema lung ([place])   . Shortness of breath   . Pleural effusion on right   . Sepsis ([place]) 03/04/2019  .  Restless legs syndrome (RLS) 06/16/2017  . Hypertension, essential 06/16/2017  . CAD in native artery 04/27/2017  . Hydrocephalus ([place]) 09/08/2016  . Dizziness and giddiness   . Elevated troponin 09/06/2016  . Type II diabetes mellitus ([place])   . Ischemic cardiomyopathy   . Hyponatremia 07/01/2015  . Diabetes mellitus type 2, uncontrolled, with complications ([place]) 49/70/2637  . 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 OSA (obstructive sleep apnea) 12/20/2013  . Morbid obesity ([place]) 12/20/2013  . AKI (acute kidney injury) - Creatinine improved at d/c 12/14/2013  . Elevated TSH - will need f/u TFTs with PCP in 3-4 weeks 12/14/2013  . Junctional bradycardia - resolved 12/14/2013  . Syncope - due to bradycardia in setting of Digoxin Toxicity 12/11/2013  . Acute on chronic HFrEF (heart failure with reduced ejection fraction) ([place]) 12/06/2013  . At risk for sudden cardiac death - on LifeVest 02-Dec-2013  . Acute on chronic combined systolic and diastolic CHF (congestive heart failure) ([place]) 12-02-13  . Cardiomyopathy, ischemic 2013-12-02  . Atrial flutter ([place]) 11/22/2013  . NSTEMI (non-ST elevated myocardial infarction) ([place]) 11/22/2013  . Long term current use of anticoagulant 10/19/2010  . Hyperlipidemia 05/06/2010  . Edema 05/06/2010  . CAD S/P percutaneous coronary angioplasty - multiple PCIs 11/11/2008  . Atrial fibrillation ([place]) 11/11/2008  . GERD 11/11/2008  . [place] INTESTINE 11/11/2008  . COLONIC POLYPS, HX OF 11/11/2008  . [place] ALLERGY 11/11/2008   PCP:  [name], MD Pharmacy:   [id number] [place] [place] [address]Phone: [phone]: [phone] [facility] [id number]: [phone]: [phone]    Social  Determinants of Health (SDOH) Interventions    Readmission Risk Interventions Readmission Risk Prevention Plan 07/12/2019 06/14/2019 04/24/2019  Transportation Screening Complete Complete Complete  Social Work Consult for [place]- - -  Medication Review (RTat [place] Complete Complete Complete  PCP or Specialist appointment within 3-5 days of discharge - Complete Complete  HRI or Home Care Consult Not Complete - (No Data)  [place] Home Care Consult Pt Refusal Comments Return to SNF - -  SW Recovery Care/Counseling Consult Complete Complete Complete  Palliative Care Screening Not Applicable Not Applicable Complete  Skilled Nursing Facility Complete Complete Complete  Some recent data might be hidden

## 2019-07-12 NOTE — NC FL2 (Signed)
[place] LEVEL OF CARE SCREENING TOOL     IDENTIFICATION  Patient Name: Colton Lamb Birthdate: 1950-09-04 Sex: male Admission Date (Current Location): 07/11/2019  [employer] and [place] Number:  Engineering geologist and Address:  [facility], [address], [place], [place] [address]      Provider Number: [id number]  Attending Physician Name and Address:  Ronny Bacon, MD  Relative Name and Phone Number:       Current Level of Care: Hospital Recommended Level of Care: [place] Prior Approval Number:    Date Approved/Denied:   PASRR Number: [id number]  Discharge Plan: SNF    Current Diagnoses: Patient Active Problem List   Diagnosis Date Noted  . Bleeding 07/11/2019  . Chronic osteomyelitis ([place]) 06/30/2019  . PICC (peripherally inserted central catheter) flush   . Normocytic anemia 06/21/2019  . Stage IV pressure ulcer of sacral region ([place]) 06/19/2019  . Hypotension 06/19/2019  . Anemia   . Acute respiratory failure ([place]) 06/12/2019  . Acute on chronic congestive heart failure ([place])   . Acute blood loss anemia   . Pressure injury of skin 03/17/2019  . Acute renal failure (ARF) ([place])   . Empyema lung ([place])   . Shortness of breath   . Pleural effusion on right   . Sepsis ([place]) 03/04/2019  . Restless legs syndrome (RLS) 06/16/2017  . Hypertension, essential 06/16/2017  . CAD in native artery 04/27/2017  . Hydrocephalus ([place]) 09/08/2016  . Dizziness and giddiness   . Elevated troponin 09/06/2016  . Type II diabetes mellitus ([place])   . Ischemic cardiomyopathy   . Hyponatremia 07/01/2015  . Diabetes mellitus type 2, uncontrolled, with complications ([place]) 17/49/4496  . 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 OSA (obstructive sleep apnea) 12/20/2013  . Morbid obesity ([place]) 12/20/2013  . AKI (acute kidney injury) - Creatinine improved at d/c 12/14/2013  . Elevated TSH -  will need f/u TFTs with PCP in 3-4 weeks 12/14/2013  . Junctional bradycardia - resolved 12/14/2013  . Syncope - due to bradycardia in setting of Digoxin Toxicity 12/11/2013  . Acute on chronic HFrEF (heart failure with reduced ejection fraction) ([place]) 12/06/2013  . At risk for sudden cardiac death - on LifeVest 11/28/13  . Acute on chronic combined systolic and diastolic CHF (congestive heart failure) ([place]) November 28, 2013  . Cardiomyopathy, ischemic November 28, 2013  . Atrial flutter ([place]) 11/22/2013  . NSTEMI (non-ST elevated myocardial infarction) ([place]) 11/22/2013  . Long term current use of anticoagulant 10/19/2010  . Hyperlipidemia 05/06/2010  . Edema 05/06/2010  . CAD S/P percutaneous coronary angioplasty - multiple PCIs 11/11/2008  . Atrial fibrillation ([place]) 11/11/2008  . GERD 11/11/2008  . [place] INTESTINE 11/11/2008  . COLONIC POLYPS, HX OF 11/11/2008  . SHELLFISH ALLERGY 11/11/2008    Orientation RESPIRATION BLADDER Height & Weight     Self, Time, Place  Normal Indwelling catheter Weight: 245 lb 6 oz (111.3 kg) Height:  5' 11"  (180.3 cm)  BEHAVIORAL SYMPTOMS/MOOD NEUROLOGICAL BOWEL NUTRITION STATUS      Colostomy Diet(see dc summary)  AMBULATORY STATUS COMMUNICATION OF NEEDS Skin   Extensive Assist Verbally PU Stage and Appropriate Care                       Personal Care Assistance Level of Assistance    Bathing Assistance: Limited assistance Feeding assistance: Independent Dressing Assistance: Limited assistance     Functional Limitations Info  Sight Info: Adequate Hearing Info: Adequate Speech Info: Adequate    SPECIAL CARE FACTORS FREQUENCY        PT Frequency: 5 times week OT Frequency: 3 times week            Contractures Contractures Info: Not present    Additional Factors Info    Code Status Info: Full Allergies Info: Iodine, Shrimp. Tetracycline           Current Medications (07/12/2019):  This is the current  hospital active medication list Current Facility-Administered Medications  Medication Dose Route Frequency Provider Last Rate Last Admin  . acetaminophen (TYLENOL) tablet 325-650 mg  325-650 mg Oral Q4H PRN Fredirick Maudlin, MD      . albuterol (PROVENTIL) (2.5 MG/3ML) 0.083% nebulizer solution 2.5 mg  2.5 mg Inhalation Q6H PRN Fredirick Maudlin, MD      . atorvastatin (LIPITOR) tablet 80 mg  80 mg Oral q1800 Fredirick Maudlin, MD      . Chlorhexidine Gluconate Cloth 2 % PADS 6 each  6 each Topical Daily Fredirick Maudlin, MD   6 each at 07/12/19 1000  . dextrose 5 % and 0.45 % NaCl with KCl 20 mEq/L infusion   Intravenous Continuous Fredirick Maudlin, MD 50 mL/hr at 07/12/19 1609 New Bag at 07/12/19 1609  . digoxin (LANOXIN) tablet 0.125 mg  0.125 mg Oral Daily Fredirick Maudlin, MD   0.125 mg at 07/12/19 1036  . finasteride (PROSCAR) tablet 5 mg  5 mg Oral Daily Fredirick Maudlin, MD   5 mg at 07/12/19 1035  . HYDROcodone-acetaminophen (NORCO/VICODIN) 5-325 MG per tablet 1 tablet  1 tablet Oral Q6H PRN Fredirick Maudlin, MD      . insulin aspart (novoLOG) injection 5 Units  5 Units Subcutaneous TID WC Fredirick Maudlin, MD   5 Units at 07/12/19 1036  . lactobacillus (FLORANEX/LACTINEX) granules 1 g  1 g Oral TID WC Fredirick Maudlin, MD   1 g at 07/12/19 1211  . metoprolol succinate (TOPROL-XL) 24 hr tablet 25 mg  25 mg Oral Daily Fredirick Maudlin, MD   25 mg at 07/12/19 1036  . midodrine (PROAMATINE) tablet 10 mg  10 mg Oral TID Fredirick Maudlin, MD   10 mg at 07/12/19 1540  . multivitamin with minerals tablet   Oral Daily Fredirick Maudlin, MD   1 tablet at 07/12/19 1036  . niacin (NIASPAN) CR tablet 1,000 mg  1,000 mg Oral Daily Fredirick Maudlin, MD   1,000 mg at 07/12/19 1037  . ondansetron (ZOFRAN-ODT) disintegrating tablet 4 mg  4 mg Oral Q6H PRN Fredirick Maudlin, MD       Or  . ondansetron [facility]) injection 4 mg  4 mg Intravenous Q6H PRN Fredirick Maudlin, MD      . pantoprazole (PROTONIX) EC  tablet 40 mg  40 mg Oral Daily Fredirick Maudlin, MD   40 mg at 07/12/19 1036  . piperacillin-tazobactam (ZOSYN) IVPB 3.375 g  3.375 g Intravenous Raynald Kemp, MD 12.5 mL/hr at 07/12/19 1541 3.375 g at 07/12/19 1541  . potassium chloride (KLOR-CON) CR tablet 10 mEq  10 mEq Oral Daily Fredirick Maudlin, MD   10 mEq at 07/12/19 1037  . simethicone (MYLICON) chewable tablet 40 mg  40 mg Oral Q6H PRN Fredirick Maudlin, MD      . tamsulosin [facility]) capsule 0.4 mg  0.4 mg Oral QPC breakfast Fredirick Maudlin, MD   0.4 mg at 07/12/19 1036  . torsemide (DEMADEX) tablet 40 mg  40 mg Oral Daily Celine Ahr,  Anderson Malta, MD   40 mg at 07/12/19 1036  . vitamin C (ASCORBIC ACID) tablet 500 mg  500 mg Oral BID Fredirick Maudlin, MD   500 mg at 07/12/19 1036     Discharge Medications: Please see discharge summary for a list of discharge medications.  Relevant Imaging Results:  Relevant Lab Results:   Additional Information SSN: [id number] LCSW

## 2019-07-12 NOTE — Consult Note (Signed)
Colton Lamb , MD [address] [place], [place], [place], [address] [address], [place], [place], [place], [address] Phone: [phone]  Fax: [phone]  Consultation  Referring Provider:    Dr. Rip Harbour  primary Care Physician:  [name], MD Primary Gastroenterologist:  Dr. Fuller Plan Reason for Consultation:     Blood in the ostomy bag  Date of Admission:  07/11/2019 Date of Consultation:  07/12/2019         HPI:   Colton Lamb is a 68 y.o. male with a history of Crohn's ileocolitis and colon polyps.  Follows with Dr. Fuller Plan for his Crohn's colitis.  Reviewed Dr. Silvio Pate last note.  Appears the patient has been on balsalazide.  He has a history of severe cardiomyopathy with ejection fraction of 20 to 25%.  Admitted in the hospital and discharged on 07/09/2019 with respiratory failure, acute congestive heart failure.  He has been on Xarelto for atrial fibrillation.  Underwent a diverting loop colostomy on 07/03/2019 for his decubitus ulcers with chronic osteomyelitis.  He presented back to the emergency room as there was blood to be seen in the ostomy bag mixed with clots.  Unclear when the last dose of Xarelto was.  It was not discontinued after his recent discharge from the hospital.  Hemoglobin 5 days back was 9.5 g on admission was 8.7 g and this morning is 8.2 g. PT/INR is checked yesterday showed an INR of 1.9.  No further bleeding after coming into the hospital , denies any abdominal pain.;  Past Medical History:  Diagnosis Date  . Atrial flutter ([place])    a. s/p Cardioversion 11/22/13, on amiodarone and Xarelto.  . CHF (congestive heart failure) ([place])   . Chronic osteomyelitis ([place]) 06/30/2019  . Chronic systolic heart failure ([place])    a. 10/2013 EF 20-25%, grade III DD, RV mildly dilated and sys fx mild/mod reduced;  b. 01/2014 Echo: EF 30-35%, gr3 DD, mod dil LA.  Marland Kitchen Coronary artery disease    a. s/p MI 2007/2015;  b. s/p prior PCI to the LAD/LCX/PDA/PL;  c. 2008:  s/p Cypher DES to the OM.  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 GERD (gastroesophageal reflux disease)   . Hx of adenomatous colonic polyps 11/2003  . Hyperlipidemia   . Hypertension   . Ischemic cardiomyopathy    s. 01/2014 s/p MDT [id number] [name] XT DR single lead AICD.  Marland Kitchen Obesity   . Paroxysmal atrial fibrillation (HCC)    a. CHA2DS2VASc = 4-->xarelto/amio.  . Sleep apnea   . Syncope    a.  11/2013 in setting of volume depletion and bradycardia due to dig toxicity   . Type II diabetes mellitus ([place])     Past Surgical History:  Procedure Laterality Date  . ATRIAL FLUTTER ABLATION N/A 04/16/2014   Procedure: ATRIAL FLUTTER ABLATION;  Surgeon: Evans Lance, MD;  Location: [facility] CATH LAB;  Service: Cardiovascular;  Laterality: N/A;  . CARDIAC CATHETERIZATION  10/2013  . CARDIAC DEFIBRILLATOR PLACEMENT  04/16/2014   Medtronic Evira device  . CARDIAC ELECTROPHYSIOLOGY STUDY AND ABLATION  04/16/2014   atrial flutter ablation  . CARDIOVERSION N/A 03/05/2014   Procedure: CARDIOVERSION;  Surgeon: Jolaine Artist, MD;  Location: [facility] ENDOSCOPY;  Service: Cardiovascular;  Laterality: N/A;  . CATARACT EXTRACTION W/PHACO Right 01/04/2017   Procedure: CATARACT EXTRACTION PHACO AND INTRAOCULAR LENS PLACEMENT ([place])  Right Diabetic Complicated;  Surgeon: Leandrew Koyanagi, MD;  Location: [facility];  Service: Ophthalmology;  Laterality: Right;  Diabetic  . CATARACT EXTRACTION W/PHACO Left 02/08/2017   Procedure: CATARACT EXTRACTION PHACO AND INTRAOCULAR LENS PLACEMENT ([place]) left diabetic;  Surgeon: Leandrew Koyanagi, MD;  Location: [facility];  Service: Ophthalmology;  Laterality: Left;  Diabetic - oral meds sleep apnea  . CORONARY ANGIOPLASTY WITH STENT PLACEMENT  2007; 2008 X 2   "1+1 ~ 1"  . FOOT SURGERY Left    bone spur  . HYDROCELE EXCISION Bilateral   . 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 IMPLANTABLE CARDIOVERTER DEFIBRILLATOR IMPLANT N/A  04/16/2014   Procedure: IMPLANTABLE CARDIOVERTER DEFIBRILLATOR IMPLANT;  Surgeon: Evans Lance, MD;  Location[facility] LAB;  Service: Cardiovascular;  Laterality: N/A;  . LEFT HEART CATHETERIZATION WITH CORONARY ANGIOGRAM N/A 11/22/2013   Procedure: LEFT HEART CATHETERIZATION WITH CORONARY ANGIOGRAM;  Surgeon: Sinclair Grooms, MD;  Location: [facility] CATH LAB;  Service: Cardiovascular;  Laterality: N/A;  . TRANSVERSE LOOP COLOSTOMY N/A 07/03/2019   Procedure: TRANSVERSE LOOP COLOSTOMY;  Surgeon: Ronny Bacon, MD;  Location: [facility] ORS;  Service: General;  Laterality: N/A;    Prior to Admission medications   Medication Sig Start Date End Date Taking? Authorizing Provider  aspirin 81 MG tablet Take 81 mg by mouth daily.    Yes [provider]  atorvastatin (LIPITOR) 80 MG tablet TAKE 1 TABLET BY MOUTH EVERY DAY Patient taking differently: Take 80 mg by mouth daily.  03/08/19  Yes Gollan, Kathlene November, MD  balsalazide (COLAZAL) 750 MG capsule TAKE 1 CAPSULE (750 MG TOTAL) BY MOUTH 3 (THREE) TIMES DAILY. Patient taking differently: Take 750 mg by mouth 3 (three) times daily.  11/28/18  Yes Ladene Artist, MD  digoxin (LANOXIN) 0.125 MG tablet Take 1 tablet (0.125 mg total) by mouth daily. 07/10/19  Yes [name], MD  finasteride (PROSCAR) 5 MG tablet Take 1 tablet (5 mg total) by mouth daily. 03/20/19  Yes Wieting, Richard, MD  insulin aspart (NOVOLOG) 100 UNIT/ML injection Inject 5 Units into the skin 3 (three) times daily with meals. Patient taking differently: Inject 5 Units into the skin 3 (three) times daily before meals.  03/19/19  Yes Wieting, Richard, MD  lactobacillus (FLORANEX/LACTINEX) PACK Take 1 packet (1 g total) by mouth 3 (three) times daily with meals. 03/19/19  Yes Wieting, Richard, MD  metoprolol succinate (TOPROL-XL) 25 MG 24 hr tablet Take 1 tablet (25 mg total) by mouth daily. 07/10/19  Yes [name], MD  midodrine (PROAMATINE) 10 MG tablet Take 1 tablet (10 mg total) by mouth  3 (three) times daily. 04/26/19  Yes Max Sane, MD  Multiple Vitamins-Minerals (DECUBI-VITE PO) Take by mouth daily.   Yes [provider]  niacin (NIASPAN) 1000 MG CR tablet Take 1,000 mg by mouth daily. 04/10/19  Yes [provider]  omeprazole (PRILOSEC) 20 MG capsule Take 20 mg by mouth daily.    Yes [provider]  piperacillin-tazobactam (ZOSYN) 3.375 GM/50ML IVPB Inject 50 mLs (3.375 g total) into the vein every 6 (six) hours for 15 days. 07/09/19 07/24/19 Yes [name], MD  potassium chloride (KLOR-CON) 10 MEQ tablet Take 1 tablet (10 mEq total) by mouth daily. 05/08/19 08/06/19 Yes Minna Merritts, MD  tamsulosin (FLOMAX) 0.4 MG CAPS capsule Take 1 capsule (0.4 mg total) by mouth daily after breakfast. 03/20/19  Yes Loletha Grayer, MD  torsemide (DEMADEX) 20 MG tablet Take 2 tablets (40 mg) by mouth once daily 05/08/19  Yes Gollan, Kathlene November, MD  vitamin C (VITAMIN  C) 500 MG tablet Take 1 tablet (500 mg total) by mouth 2 (two) times daily. 07/09/19  Yes [name], MD  XARELTO 20 MG TABS tablet TAKE 1 TABLET BY MOUTH EVERY DAY WITH LUNCH 05/17/19  Yes Gollan, Kathlene November, MD  acetaminophen (TYLENOL) 325 MG tablet Take 1-2 tablets (325-650 mg total) by mouth every 4 (four) hours as needed for mild pain. 04/17/14   [name], NP  Albuterol Sulfate 108 (90 Base) MCG/ACT AEPB Inhale 1 puff into the lungs every 6 (six) hours as needed (shortness of breath).     [provider]  HYDROcodone-acetaminophen (NORCO/VICODIN) 5-325 MG tablet Take 1 tablet by mouth every 6 (six) hours as needed for severe pain. 07/09/19   [name], MD  Nutritional Supplements (FEEDING SUPPLEMENT, NEPRO CARB STEADY,) LIQD Take 237 mLs by mouth 2 (two) times daily between meals. 03/19/19   Loletha Grayer, MD    Family History  Problem Relation Age of Onset  . Breast cancer Mother   . Heart disease Father   . Heart attack Father   . Colon cancer Neg Hx      Social History    Tobacco Use  . Smoking status: Never Smoker  . Smokeless tobacco: Never Used  Substance Use Topics  . Alcohol use: No  . Drug use: No    Allergies as of 07/11/2019 - Review Complete 07/11/2019  Allergen Reaction Noted  . Iodine Other (See Comments)   . Shrimp [shellfish allergy] Other (See Comments) 12/12/2013  . Tetracycline Rash     Review of Systems:    All systems reviewed and negative except where noted in HPI.   Physical Exam:  Vital signs in last 24 hours: Temp:  [97.5 F (36.4 C)-97.7 F (36.5 C)] 97.5 F (36.4 C) (12/11 0520) Pulse Rate:  [68-82] 68 (12/11 0622) Resp:  [16-28] 20 (12/11 0520) BP: (81-110)/(59-72) 94/66 (12/11 0622) SpO2:  [100 %] 100 % (12/11 0520) Weight:  [107.5 kg-111.3 kg] 111.3 kg (12/10 2159)   General:   Pleasant, cooperative in NAD Head:  Normocephalic and atraumatic. Eyes:   No icterus.   Conjunctiva pink. PERRLA. Ears:  Normal auditory acuity. Neck:  Supple; no masses or thyroidomegaly Lungs: Respirations even and unlabored. Lungs clear to auscultation bilaterally.   No wheezes, crackles, or rhonchi.  Heart:  Regular rate and rhythm;  Without murmur, clicks, rubs or gallops Abdomen:  Soft, nondistended, nontender. Normal bowel sounds. No appreciable masses or hepatomegaly.  No rebound or guarding.No blood in the ostomy bag seen   Neurologic:  Alert and oriented x3;  grossly normal neurologically. Cervical Nodes:  No significant cervical adenopathy. Psych:  Alert and cooperative. Normal affect.  LAB RESULTS: Recent Labs    07/11/19 1835 07/11/19 1943 07/12/19 0504  WBC 10.8* 10.8* 9.3  HGB 8.7* 8.6* 8.2*  HCT 28.5* 28.8* 26.7*  PLT 197 216 182   BMET Recent Labs    07/11/19 1835  NA 141  K 3.4*  CL 103  CO2 34*  GLUCOSE 144*  BUN 13  CREATININE 0.74  CALCIUM 8.1*   LFT Recent Labs    07/11/19 1835  PROT 5.7*  ALBUMIN 2.6*  AST 16  ALT 12  ALKPHOS 102  BILITOT 0.4   PT/INR Recent Labs     07/11/19 1835  LABPROT 21.5*  INR 1.9*    STUDIES: No results found.    Impression / Plan:   JASMOND RIVER is a 68 y.o. y/o male with  a history of Crohn's ileocolitis managed by Dr. Fuller Plan.  Recently underwent a dilating colostomy for sacral decubitus ulcers along with chronic osteomyelitis.  Patient readmitted last night as was blood seen in the ostomy bag.  Seen by surgery.  No lesion seen at the ostomy site.  Mild drop in hemoglobin since discharge.  Patient has probably been on Xarelto INR is 1.9.  The patient is unclear when the last dose was taken but it has not been stopped after discharge.  No elevation in the BUN/creatinine ratio suggesting this is unlikely to be an upper GI bleed.  Plan 1.  Suggest to monitor for further bleeding.  If there is active bleeding to get a tagged RBC scan. 2.  Watch next 24 hours.  If hemoglobin is stable may not need further intervention.  If there is further episodes of bleeding or drops may need to consider endoscopic evaluation from the ostomy site+/-EGD once INR is less than 1.5. 3.  Monitor CBC and transfuse as needed.  Thank you for involving me in the care of this patient.      LOS: 0 days   Colton Bellows, MD  07/12/2019, 9:03 AM

## 2019-07-12 NOTE — Consult Note (Addendum)
Colton Lamb Nurse Consult Note: Patient receiving care in [facility].  Dr. Christian Mate and PA Z. Olean Ree at bedside for ostomy assessment.  Dr. Christian Mate removed the rigid support rod. 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 It is a site of osteomyelitis, therefore the decision was made to discontinue the NPWT and initiate topical therapy. The LLQ has a colostomy.   Pressure Injury POA: Yes Measurement: The wound measures 5.3 cm x 6.8 cm x 3.3 cm with a 7cm tunnel at 7 o'clock extending to the left buttock.  There was no foam in the tunnel from the NPWT dressing placed in the SNF prior to his arrival. Wound bed: slick pink with multiple points of exposed bone Drainage (amount, consistency, odor) serosanginous Periwound: intact Dressing procedure/placement/frequency: Place two pieces of Aquacel [name] [phone]) into the sacral wound.  MAKE SURE some of it goes into the tunnel (that is 7cm long)  toward the left buttock. Cover with a foam pad.  Change daily.  [name] Nurse ostomy follow up Stoma type/location: LLQ colostomy Stomal assessment/size: 2 inches top to bottom, 2 3/4 inches side to side.  The stoma was examined by Dr. Christian Mate and PA Olean Ree.  It is now yellow and brown in color, moist.  Peristomal assessment: intact Treatment options for stomal/peristomal skin: skin barrier Output:  Small amount of bloody drainage in old pouch  Ostomy pouching: 2pc. 2 3/4 inch stock supply  Monitor the wound area(s) for worsening of condition such as: Signs/symptoms of infection,  Increase in size,  Development of or worsening of odor, Development of pain, or increased pain at the affected locations.  Notify the medical team if any of these develop.  Thank you for the consult.  Discussed plan of care with the patient and bedside nurse.  [name] nurse will not follow at this time.  Please  re-consult the [place] team if needed.  Colton Riles, RN, MSN, CWOCN, CNS-BC, pager [phone]

## 2019-07-12 NOTE — H&P (Addendum)
[facility] SURGICAL HISTORY & PHYSICAL (cpt [phone])  HISTORY OF PRESENT ILLNESS (HPI):  68 y.o. male known to our service who underwent diverting loop colostomy with Dr Christian Mate on 12/02 for fecal management in the setting of stage VI sacral decubitus ulceration with chronic osteomyelitis. He presented to [facility] ED yesterday (12/10) for evaluation of bleeding from his colostomy site. While at [facility] he was found to have blood in his colostomy bag. This was reportedly venous blood mixed with clots. ED note reflects the same. Patient reports that since maturation he continues to also have stool and gas in the bag but yesterday was more significant amounts of blood. No abdominal pain, fever, chills, SOB, CP, lightheadedness, or dizziness. No other new complaints. Sacral ulcer continues to be managed with wound vac although vac NOT sent with patient to the hospital. Work up in the ED revealed a Hgb of 8.7 which has since been trended (8.7 --> 8.6 --> 8.2) and there are decreases across all cell lines which may represent some dilution component to his anemia. He is unsure if he was getting his Xarelto at [facility]. He was cleared to restart this after discharge on 12/08.   General surgery is consulted by emergency medicine physician Dr Corinna Capra, MD for evaluation and management of bleeding from diverting loop colostomy site.      PAST MEDICAL HISTORY (PMH):  Past Medical History:  Diagnosis Date  . Atrial flutter ([place])    a. s/p Cardioversion 11/22/13, on amiodarone and Xarelto.  . CHF (congestive heart failure) ([place])   . Chronic osteomyelitis ([place]) 06/30/2019  . Chronic systolic heart failure ([place])    a. 10/2013 EF 20-25%, grade III DD, RV mildly dilated and sys fx mild/mod reduced;  b. 01/2014 Echo: EF 30-35%, gr3 DD, mod dil LA.  Marland Kitchen Coronary artery disease    a. s/p MI 2007/2015;  b. s/p prior PCI to the LAD/LCX/PDA/PL;  c. 2008: s/p Cypher DES to the OM.  Marland Kitchen Crohn's ileocolitis  ([place])   . GERD (gastroesophageal reflux disease)   . Hx of adenomatous colonic polyps 11/2003  . Hyperlipidemia   . Hypertension   . Ischemic cardiomyopathy    s. 01/2014 s/p MDT [id number] [name] XT DR single lead AICD.  Marland Kitchen Obesity   . Paroxysmal atrial fibrillation (HCC)    a. CHA2DS2VASc = 4-->xarelto/amio.  . Sleep apnea   . Syncope    a.  11/2013 in setting of volume depletion and bradycardia due to dig toxicity   . Type II diabetes mellitus ([place])     Reviewed. Otherwise negative.   PAST SURGICAL HISTORY ([place]):  Past Surgical History:  Procedure Laterality Date  . ATRIAL FLUTTER ABLATION N/A 04/16/2014   Procedure: ATRIAL FLUTTER ABLATION;  Surgeon: Evans Lance, MD;  Location: [facility] CATH LAB;  Service: Cardiovascular;  Laterality: N/A;  . CARDIAC CATHETERIZATION  10/2013  . CARDIAC DEFIBRILLATOR PLACEMENT  04/16/2014   Medtronic Evira device  . CARDIAC ELECTROPHYSIOLOGY STUDY AND ABLATION  04/16/2014   atrial flutter ablation  . CARDIOVERSION N/A 03/05/2014   Procedure: CARDIOVERSION;  Surgeon: Jolaine Artist, MD;  Location: [facility] ENDOSCOPY;  Service: Cardiovascular;  Laterality: N/A;  . CATARACT EXTRACTION W/PHACO Right 01/04/2017   Procedure: CATARACT EXTRACTION PHACO AND INTRAOCULAR LENS PLACEMENT ([place])  Right Diabetic Complicated;  Surgeon: Leandrew Koyanagi, MD;  Location: [place];  Service: Ophthalmology;  Laterality: Right;  Diabetic  . CATARACT EXTRACTION W/PHACO Left 02/08/2017   Procedure: CATARACT EXTRACTION PHACO AND  INTRAOCULAR LENS PLACEMENT (IOC) left diabetic;  Surgeon: Leandrew Koyanagi, MD;  Location: [place];  Service: Ophthalmology;  Laterality: Left;  Diabetic - oral meds sleep apnea  . CORONARY ANGIOPLASTY WITH STENT PLACEMENT  2007; 2008 X 2   "1+1 ~ 1"  . FOOT SURGERY Left    bone spur  . HYDROCELE EXCISION Bilateral   . 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 IMPLANTABLE CARDIOVERTER DEFIBRILLATOR  IMPLANT N/A 04/16/2014   Procedure: IMPLANTABLE CARDIOVERTER DEFIBRILLATOR IMPLANT;  Surgeon: Evans Lance, MD;  Location: [facility] CATH LAB;  Service: Cardiovascular;  Laterality: N/A;  . LEFT HEART CATHETERIZATION WITH CORONARY ANGIOGRAM N/A 11/22/2013   Procedure: LEFT HEART CATHETERIZATION WITH CORONARY ANGIOGRAM;  Surgeon: Sinclair Grooms, MD;  Location: [facility] CATH LAB;  Service: Cardiovascular;  Laterality: N/A;  . TRANSVERSE LOOP COLOSTOMY N/A 07/03/2019   Procedure: TRANSVERSE LOOP COLOSTOMY;  Surgeon: Ronny Bacon, MD;  Location: [facility] ORS;  Service: General;  Laterality: N/A;    Reviewed. Otherwise negative.   MEDICATIONS:  Prior to Admission medications   Medication Sig Start Date End Date Taking? Authorizing Provider  aspirin 81 MG tablet Take 81 mg by mouth daily.    Yes [provider]  atorvastatin (LIPITOR) 80 MG tablet TAKE 1 TABLET BY MOUTH EVERY DAY Patient taking differently: Take 80 mg by mouth daily.  03/08/19  Yes Gollan, Kathlene November, MD  balsalazide (COLAZAL) 750 MG capsule TAKE 1 CAPSULE (750 MG TOTAL) BY MOUTH 3 (THREE) TIMES DAILY. Patient taking differently: Take 750 mg by mouth 3 (three) times daily.  11/28/18  Yes Ladene Artist, MD  digoxin (LANOXIN) 0.125 MG tablet Take 1 tablet (0.125 mg total) by mouth daily. 07/10/19  Yes [name], MD  finasteride (PROSCAR) 5 MG tablet Take 1 tablet (5 mg total) by mouth daily. 03/20/19  Yes Wieting, Richard, MD  insulin aspart (NOVOLOG) 100 UNIT/ML injection Inject 5 Units into the skin 3 (three) times daily with meals. Patient taking differently: Inject 5 Units into the skin 3 (three) times daily before meals.  03/19/19  Yes Wieting, Richard, MD  lactobacillus (FLORANEX/LACTINEX) PACK Take 1 packet (1 g total) by mouth 3 (three) times daily with meals. 03/19/19  Yes Wieting, Richard, MD  metoprolol succinate (TOPROL-XL) 25 MG 24 hr tablet Take 1 tablet (25 mg total) by mouth daily. 07/10/19  Yes [name], MD  midodrine  (PROAMATINE) 10 MG tablet Take 1 tablet (10 mg total) by mouth 3 (three) times daily. 04/26/19  Yes Max Sane, MD  Multiple Vitamins-Minerals (DECUBI-VITE PO) Take by mouth daily.   Yes [provider]  niacin (NIASPAN) 1000 MG CR tablet Take 1,000 mg by mouth daily. 04/10/19  Yes [provider]  omeprazole (PRILOSEC) 20 MG capsule Take 20 mg by mouth daily.    Yes [provider]  piperacillin-tazobactam (ZOSYN) 3.375 GM/50ML IVPB Inject 50 mLs (3.375 g total) into the vein every 6 (six) hours for 15 days. 07/09/19 07/24/19 Yes [name], MD  potassium chloride (KLOR-CON) 10 MEQ tablet Take 1 tablet (10 mEq total) by mouth daily. 05/08/19 08/06/19 Yes Minna Merritts, MD  tamsulosin (FLOMAX) 0.4 MG CAPS capsule Take 1 capsule (0.4 mg total) by mouth daily after breakfast. 03/20/19  Yes Leslye Peer, Richard, MD  torsemide (DEMADEX) 20 MG tablet Take 2 tablets (40 mg) by mouth once daily 05/08/19  Yes Gollan, Kathlene November, MD  vitamin C (VITAMIN C) 500 MG tablet Take 1 tablet (500  mg total) by mouth 2 (two) times daily. 07/09/19  Yes [name], MD  XARELTO 20 MG TABS tablet TAKE 1 TABLET BY MOUTH EVERY DAY WITH LUNCH 05/17/19  Yes Gollan, Kathlene November, MD  acetaminophen (TYLENOL) 325 MG tablet Take 1-2 tablets (325-650 mg total) by mouth every 4 (four) hours as needed for mild pain. 04/17/14   [name], NP  Albuterol Sulfate 108 (90 Base) MCG/ACT AEPB Inhale 1 puff into the lungs every 6 (six) hours as needed (shortness of breath).     [provider]  HYDROcodone-acetaminophen (NORCO/VICODIN) 5-325 MG tablet Take 1 tablet by mouth every 6 (six) hours as needed for severe pain. 07/09/19   [name], MD  Nutritional Supplements (FEEDING SUPPLEMENT, NEPRO CARB STEADY,) LIQD Take 237 mLs by mouth 2 (two) times daily between meals. 03/19/19   Loletha Grayer, MD     ALLERGIES:  Allergies  Allergen Reactions  . Iodine Other (See Comments)    Shortness of breath, swelling  and hives  . Shrimp [Shellfish Allergy] Other (See Comments)    SWELLING    HIVES    SHORTNESS OF BREATH  . Tetracycline Rash     SOCIAL HISTORY:  Social History   Socioeconomic History  . Marital status: Widowed    Spouse name: Not on file  . Number of children: 1  . Years of education: Not on file  . Highest education level: Not on file  Occupational History  . Occupation: retired    Fish farm manager: RETIRED  Tobacco Use  . Smoking status: Never Smoker  . Smokeless tobacco: Never Used  Substance and Sexual Activity  . Alcohol use: No  . Drug use: No  . Sexual activity: Never  Other Topics Concern  . Not on file  Social History Narrative  . Not on file   Social Determinants of Health   Financial Resource Strain: Low Risk   . Difficulty of Paying Living Expenses: Not hard at all  Food Insecurity: No Food Insecurity  . Worried About Charity fundraiser in the Last Year: Never true  . Ran Out of Food in the Last Year: Never true  Transportation Needs: No Transportation Needs  . Lack of Transportation (Medical): No  . Lack of Transportation (Non-Medical): No  Physical Activity: Inactive  . Days of Exercise per Week: 0 days  . Minutes of Exercise per Session: 0 min  Stress:   . Feeling of Stress : Not on file  Social Connections: Moderately Isolated  . Frequency of Communication with Friends and Family: Never  . Frequency of Social Gatherings with Friends and Family: Never  . Attends Religious Services: Never  . Active Member of Clubs or Organizations: Yes  . Attends Archivist Meetings: 1 to 4 times per year  . Marital Status: Never married  Intimate Partner Violence: Unknown  . Fear of Current or Ex-Partner: No  . Emotionally Abused: Not on file  . Physically Abused: No  . Sexually Abused: No     FAMILY HISTORY:  Family History  Problem Relation Age of Onset  . Breast cancer Mother   . Heart disease Father   . Heart attack Father   . Colon cancer Neg  Hx     Otherwise negative.   REVIEW OF SYSTEMS:  Review of Systems  Constitutional: Negative for chills and fever.  Respiratory: Negative for cough and shortness of breath.   Cardiovascular: Negative for chest pain and palpitations.  Gastrointestinal: Negative for abdominal pain,  constipation, diarrhea, nausea and vomiting.       + Blood from loop colostomy site  Genitourinary: Negative for dysuria and urgency.  Neurological: Negative for dizziness and weakness.  All other systems reviewed and are negative.   VITAL SIGNS:  Temp:  [97.5 F (36.4 C)-97.7 F (36.5 C)] 97.5 F (36.4 C) (12/11 0520) Pulse Rate:  [68-82] 68 (12/11 0622) Resp:  [16-28] 20 (12/11 0520) BP: (81-110)/(59-72) 94/66 (12/11 0622) SpO2:  [100 %] 100 % (12/11 0520) Weight:  [107.5 kg-111.3 kg] 111.3 kg (12/10 2159)     Height: 5' 11"  (180.3 cm) Weight: 111.3 kg BMI (Calculated): 34.24   PHYSICAL EXAM:  Physical Exam Vitals and nursing note reviewed.  Constitutional:      General: He is not in acute distress.    Appearance: Normal appearance. He is obese. He is not ill-appearing.  HENT:     Head: Normocephalic and atraumatic.  Eyes:     General: No scleral icterus.    Conjunctiva/sclera: Conjunctivae normal.  Pulmonary:     Effort: Pulmonary effort is normal. No respiratory distress.     Comments: On [place] Abdominal:     General: A surgical scar is present. The ostomy site is clean. There is no distension.     Palpations: Abdomen is soft. There is no shifting dullness.     Tenderness: There is no abdominal tenderness. There is no guarding or rebound.    Genitourinary:    Comments: Deferred Skin:    General: Skin is warm and dry.     Comments: Wound vac dressing to sacrum although this is not connect as wound vac not sent to hospital with patient  Neurological:     General: No focal deficit present.     Mental Status: He is alert. Mental status is at baseline.  Psychiatric:        Mood and Affect:  Mood normal.        Behavior: Behavior normal.     INTAKE/OUTPUT:  This shift: No intake/output data recorded.  Last 2 shifts: @IOLAST2SHIFTS @  Labs:  CBC Latest Ref Rng & Units 07/12/2019 07/11/2019 07/11/2019  WBC 4.0 - 10.5 K/uL 9.3 10.8(H) 10.8(H)  Hemoglobin 13.0 - 17.0 g/dL 8.2(L) 8.6(L) 8.7(L)  Hematocrit 39.0 - 52.0 % 26.7(L) 28.8(L) 28.5(L)  Platelets 150 - 400 K/uL 182 216 197   CMP Latest Ref Rng & Units 07/11/2019 07/09/2019 07/08/2019  Glucose 70 - 99 mg/dL 144(H) 104(H) 106(H)  BUN 8 - 23 mg/dL 13 16 16   Creatinine 0.61 - 1.24 mg/dL 0.74 0.79 0.73  Sodium 135 - 145 mmol/L 141 140 141  Potassium 3.5 - 5.1 mmol/L 3.4(L) 3.8 4.2  Chloride 98 - 111 mmol/L 103 102 101  CO2 22 - 32 mmol/L 34(H) 31 34(H)  Calcium 8.9 - 10.3 mg/dL 8.1(L) 8.7(L) 8.7(L)  Total Protein 6.5 - 8.1 g/dL 5.7(L) - -  Total Bilirubin 0.3 - 1.2 mg/dL 0.4 - -  Alkaline Phos 38 - 126 U/L 102 - -  AST 15 - 41 U/L 16 - -  ALT 0 - 44 U/L 12 - -     Imaging studies:  No new pertinent imaging studies   Assessment/Plan: (ICD-10's: R36) 68 y.o. male with anemia, suspect component of slow leak from ostomy site exacerbated with therapeutic anticoagulation in combination with dilutional effect from IVF resuscitation 9 days diverting loop sigmoid colostomyfor stool diversion away from stage VI sacral decubitus ulceration with chronic osteomyelitis   - Monitor H&H; transfuse if  needed  - Continue IV Abx (Zosyn) for chronic osteomyelitis  - pain control prn  - Consult WOC RN for wound and colostomy management; reordered wound vac for hospital   - No emergent surgical re-intervention; will monitor bleeding   - Medical management of comorbidities; home medications; will consult medicine service if needed   - DVT prophylaxis; hold anticoagulation  All of the above findings and recommendations were discussed with the patient, and all of his questions were answered to his expressed satisfaction.  -- Edison Simon, PA-C [facility] 07/12/2019, 7:27 AM [phone] M-F: 7am - 4pm

## 2019-07-13 DIAGNOSIS — K922 Gastrointestinal hemorrhage, unspecified: Secondary | ICD-10-CM

## 2019-07-13 LAB — GLUCOSE, CAPILLARY: Glucose-Capillary: 95 mg/dL (ref 70–99)

## 2019-07-13 MED ORDER — SODIUM CHLORIDE 0.9 % IV SOLN
INTRAVENOUS | Status: DC | PRN
  Administered 2019-07-13: 16:00:00 30 mL via INTRAVENOUS
  Administered 2019-07-14: 14:00:00 200 mL via INTRAVENOUS
  Administered 2019-07-15: 05:00:00 250 mL via INTRAVENOUS
  Administered 2019-07-15: 23:00:00 500 mL via INTRAVENOUS
  Administered 2019-07-15 (×2): 10 mL via INTRAVENOUS
  Administered 2019-07-17 (×2): 500 mL via INTRAVENOUS

## 2019-07-13 NOTE — Progress Notes (Signed)
[place] Hospital Day(s): 1.   Post op day(s):  10.   Interval History: Patient seen and examined, no acute events or new complaints overnight. Patient reports no bleeding from stoma.   Vital signs in last 24 hours: [min-max] current  Temp:  [97.6 F (36.4 C)-98.2 F (36.8 C)] 98.2 F (36.8 C) (12/12 0607) Pulse Rate:  [62-98] 73 (12/12 0607) Resp:  [18-20] 18 (12/12 0607) BP: (92-101)/(54-68) 101/68 (12/12 0607) SpO2:  [94 %-99 %] 94 % (12/12 0607)     Height: 5' 11"  (180.3 cm) Weight: 111.3 kg BMI (Calculated): 34.24   Intake/Output last 2 shifts:  12/11 0701 - 12/12 0700 In: 1872.7 [P.O.:480; I.V.:1190; IV Piggyback:202.7] Out: 625 [Urine:600; Stool:25]   Physical Exam:  Constitutional: alert, cooperative and no distress  Respiratory: breathing non-labored at rest  Cardiovascular: regular rate and sinus rhythm  Gastrointestinal: soft, non-tender, and non-distended Stoma: No worse with persisting slough, no frank necrosis.  Dilute red-tinged fluid in bag, no gross blood, no stool in bag.  Labs:  CBC Latest Ref Rng & Units 07/12/2019 07/11/2019 07/11/2019  WBC 4.0 - 10.5 K/uL 9.3 10.8(H) 10.8(H)  Hemoglobin 13.0 - 17.0 g/dL 8.2(L) 8.6(L) 8.7(L)  Hematocrit 39.0 - 52.0 % 26.7(L) 28.8(L) 28.5(L)  Platelets 150 - 400 K/uL 182 216 197   CMP Latest Ref Rng & Units 07/11/2019 07/09/2019 07/08/2019  Glucose 70 - 99 mg/dL 144(H) 104(H) 106(H)  BUN 8 - 23 mg/dL 13 16 16   Creatinine 0.61 - 1.24 mg/dL 0.74 0.79 0.73  Sodium 135 - 145 mmol/L 141 140 141  Potassium 3.5 - 5.1 mmol/L 3.4(L) 3.8 4.2  Chloride 98 - 111 mmol/L 103 102 101  CO2 22 - 32 mmol/L 34(H) 31 34(H)  Calcium 8.9 - 10.3 mg/dL 8.1(L) 8.7(L) 8.7(L)  Total Protein 6.5 - 8.1 g/dL 5.7(L) - -  Total Bilirubin 0.3 - 1.2 mg/dL 0.4 - -  Alkaline Phos 38 - 126 U/L 102 - -  AST 15 - 41 U/L 16 - -  ALT 0 - 44 U/L 12 - -   Imaging studies: No new pertinent imaging  studies   Assessment/Plan:  68 y.o. male with recent colostomy creation   s/p postoperative oozing complicated by blood thinners.  Hemoglobin stable.   We will continue to hold his blood thinner for now, follow-up with serial CBC, anticipate resumption of blood thinner and transfer to skilled nursing center when bed available. -- Ronny Bacon, M.D., [facility] 07/13/2019

## 2019-07-13 NOTE — Progress Notes (Signed)
Colton Antigua, MD [address], [place], [place], [place], [address] [address], [place], [place], [place], [address] Phone: [phone]  Fax: [phone]   Subjective:  Patient reports feeling well with no complaints.  No abdominal pain.  Objective: Exam: Vital signs in last 24 hours: Vitals:   07/12/19 2044 07/13/19 0607 07/13/19 0955 07/13/19 1032  BP: (!) 92/54 101/68  96/61  Pulse: 62 73 72 66  Resp: 20 18    Temp: 97.9 F (36.6 C) 98.2 F (36.8 C)    TempSrc: Oral Oral    SpO2: 96% 94%    Weight:      Height:       Weight change:   Intake/Output Summary (Last 24 hours) at 07/13/2019 1610 Last data filed at 07/13/2019 1017 Gross per 24 hour  Intake 1992.68 ml  Output 625 ml  Net 1367.68 ml    General: No acute distress, AAO x3 Abd: Soft, NT/ND, No HSM.  Ostomy bag with watery pink to red output.  Ostomy site somewhat erythematous but not actively bleeding. Skin: Warm, no rashes Neck: Supple, Trachea midline   Lab Results: Lab Results  Component Value Date   WBC 9.3 07/12/2019   HGB 8.2 (L) 07/12/2019   HCT 26.7 (L) 07/12/2019   MCV 80.7 07/12/2019   PLT 182 07/12/2019   Micro Results: Recent Results (from the past 240 hour(s))  SARS CORONAVIRUS 2 (TAT 6-24 HRS) Nasopharyngeal Nasopharyngeal Swab     Status: None   Collection Time: 07/08/19  2:04 PM   Specimen: Nasopharyngeal Swab  Result Value Ref Range Status   SARS Coronavirus 2 NEGATIVE NEGATIVE Final    Comment: (NOTE) SARS-CoV-2 target nucleic acids are NOT DETECTED. The SARS-CoV-2 RNA is generally detectable in upper and lower respiratory specimens during the acute phase of infection. Negative results do not preclude SARS-CoV-2 infection, do not rule out co-infections with other pathogens, and should not be used as the sole basis for treatment or other patient management decisions. Negative results must be combined with clinical observations, patient history, and  epidemiological information. The expected result is Negative. Fact Sheet for Patients: SugarRoll.be Fact Sheet for Healthcare Providers: https://www.woods-mathews.com/ This test is not yet approved or cleared by the [place] FDA and  has been authorized for detection and/or diagnosis of SARS-CoV-2 by FDA under an Emergency Use Authorization (EUA). This EUA will remain  in effect (meaning this test can be used) for the duration of the COVID-19 declaration under Section 56 4(b)(1) of the Act, 21 U.S.C. section 360bbb-3(b)(1), unless the authorization is terminated or revoked sooner. Performed at [facility], [place] [address] [place], [place] [address]   SARS CORONAVIRUS 2 (TAT 6-24 HRS) Nasopharyngeal Nasopharyngeal Swab     Status: None   Collection Time: 07/11/19  7:43 PM   Specimen: Nasopharyngeal Swab  Result Value Ref Range Status   SARS Coronavirus 2 NEGATIVE NEGATIVE Final    Comment: (NOTE) SARS-CoV-2 target nucleic acids are NOT DETECTED. The SARS-CoV-2 RNA is generally detectable in upper and lower respiratory specimens during the acute phase of infection. Negative results do not preclude SARS-CoV-2 infection, do not rule out co-infections with other pathogens, and should not be used as the sole basis for treatment or other patient management decisions. Negative results must be combined with clinical observations, patient history, and epidemiological information. The expected result is Negative. Fact Sheet for Patients: SugarRoll.be Fact Sheet for Healthcare Providers: https://www.woods-mathews.com/ This test is not yet approved or cleared  by the [place] and  has been authorized for detection and/or diagnosis of SARS-CoV-2 by FDA under an Emergency Use Authorization (EUA). This EUA will remain  in effect (meaning this test can be used) for the duration of the COVID-19  declaration under Section 56 4(b)(1) of the Act, 21 U.S.C. section 360bbb-3(b)(1), unless the authorization is terminated or revoked sooner. Performed at [facility], [place] [address] [place], [place] [address]    Studies/Results: No results found. Medications:  Scheduled Meds: . atorvastatin  80 mg Oral q1800  . Chlorhexidine Gluconate Cloth  6 each Topical Daily  . digoxin  0.125 mg Oral Daily  . finasteride  5 mg Oral Daily  . insulin aspart  5 Units Subcutaneous TID WC  . lactobacillus  1 g Oral TID WC  . metoprolol succinate  25 mg Oral Daily  . midodrine  10 mg Oral TID  . multivitamin with minerals   Oral Daily  . niacin  1,000 mg Oral Daily  . pantoprazole  40 mg Oral Daily  . potassium chloride  10 mEq Oral Daily  . tamsulosin  0.4 mg Oral QPC breakfast  . torsemide  40 mg Oral Daily  . ascorbic acid  500 mg Oral BID   Continuous Infusions: . sodium chloride 30 mL (07/13/19 1535)  . dextrose 5 % and 0.45 % NaCl with KCl 20 mEq/L 50 mL/hr at 07/13/19 1218  . piperacillin-tazobactam 3.375 g (07/13/19 1536)   PRN Meds:.sodium chloride, acetaminophen, albuterol, HYDROcodone-acetaminophen, ondansetron **OR** ondansetron (ZOFRAN) IV, simethicone   Assessment: Active Problems:   Bleeding    Plan: Patient's hemoglobin has remained stable  His stoma site itself appears red, but not actively bleeding.  Given his recent loop ileostomy creation, the stoma itself is the likely source of the bleeding that occurred when he presented.  Would recommend close surgery follow-up for this with daily or frequent assessment of the ostomy site itself.  No endoscopic intervention indicated at this time  Continue serial CBCs   LOS: 1 day   Colton Antigua, MD 07/13/2019, 4:10 PM

## 2019-07-14 NOTE — Progress Notes (Signed)
[place] Hospital Day(s): 2.   Post op day(s):  10.   Interval History: Patient seen and examined, no acute events or new complaints overnight. Patient reports no bleeding from stoma.   Vital signs in last 24 hours: [min-max] current  Temp:  [98.1 F (36.7 C)-98.7 F (37.1 C)] 98.1 F (36.7 C) (12/13 0430) Pulse Rate:  [71-77] 77 (12/13 0910) Resp:  [20] 20 (12/13 0430) BP: (98-105)/(62-68) 98/62 (12/13 0910) SpO2:  [97 %-99 %] 99 % (12/13 0430)     Height: 5' 11"  (180.3 cm) Weight: 111.3 kg BMI (Calculated): 34.24   Intake/Output last 2 shifts:  12/12 0701 - 12/13 0700 In: 912 [P.O.:360; I.V.:505.9; IV Piggyback:46.1] Out: 600 [Urine:500; Stool:100]   Physical Exam:  Constitutional: alert, cooperative and no distress  Respiratory: breathing non-labored at rest  Cardiovascular: regular rate and sinus rhythm  Gastrointestinal: soft, non-tender, and non-distended Stoma: Appearance is no worse with diminishing slough, no frank necrosis.  Dilute red-tinged fluid and stool in bag, no gross blood.  Labs:  CBC Latest Ref Rng & Units 07/12/2019 07/11/2019 07/11/2019  WBC 4.0 - 10.5 K/uL 9.3 10.8(H) 10.8(H)  Hemoglobin 13.0 - 17.0 g/dL 8.2(L) 8.6(L) 8.7(L)  Hematocrit 39.0 - 52.0 % 26.7(L) 28.8(L) 28.5(L)  Platelets 150 - 400 K/uL 182 216 197   CMP Latest Ref Rng & Units 07/11/2019 07/09/2019 07/08/2019  Glucose 70 - 99 mg/dL 144(H) 104(H) 106(H)  BUN 8 - 23 mg/dL 13 16 16   Creatinine 0.61 - 1.24 mg/dL 0.74 0.79 0.73  Sodium 135 - 145 mmol/L 141 140 141  Potassium 3.5 - 5.1 mmol/L 3.4(L) 3.8 4.2  Chloride 98 - 111 mmol/L 103 102 101  CO2 22 - 32 mmol/L 34(H) 31 34(H)  Calcium 8.9 - 10.3 mg/dL 8.1(L) 8.7(L) 8.7(L)  Total Protein 6.5 - 8.1 g/dL 5.7(L) - -  Total Bilirubin 0.3 - 1.2 mg/dL 0.4 - -  Alkaline Phos 38 - 126 U/L 102 - -  AST 15 - 41 U/L 16 - -  ALT 0 - 44 U/L 12 - -   Imaging studies: No new pertinent imaging  studies   Assessment/Plan:  68 y.o. male with recent colostomy creation   s/p postoperative oozing complicated by blood thinners.  Hemoglobin stable, will recheck with AM lab.   We will continue to hold his blood thinner for now, anticipate resumption of blood thinner tomorrow and transfer to skilled nursing center when bed available. -- Ronny Bacon, M.D., [facility] 07/14/2019

## 2019-07-15 ENCOUNTER — Inpatient Hospital Stay: Payer: Medicare Other

## 2019-07-15 ENCOUNTER — Inpatient Hospital Stay: Payer: Self-pay

## 2019-07-15 DIAGNOSIS — R58 Hemorrhage, not elsewhere classified: Secondary | ICD-10-CM

## 2019-07-15 LAB — HEMOGLOBIN AND HEMATOCRIT, BLOOD
HCT: 26.7 % — ABNORMAL LOW (ref 39.0–52.0)
Hemoglobin: 8.3 g/dL — ABNORMAL LOW (ref 13.0–17.0)

## 2019-07-15 LAB — GLUCOSE, CAPILLARY: Glucose-Capillary: 96 mg/dL (ref 70–99)

## 2019-07-15 MED ORDER — RIVAROXABAN 20 MG PO TABS
20.0000 mg | ORAL_TABLET | Freq: Every day | ORAL | Status: DC
  Administered 2019-07-15 – 2019-07-18 (×4): 20 mg via ORAL
  Filled 2019-07-15 (×4): qty 1

## 2019-07-15 MED ORDER — SODIUM CHLORIDE 0.9 % IV SOLN
510.0000 mg | Freq: Once | INTRAVENOUS | Status: AC
  Administered 2019-07-15: 19:00:00 510 mg via INTRAVENOUS
  Filled 2019-07-15: qty 17

## 2019-07-15 NOTE — Progress Notes (Signed)
Patient is currently followed by [place] program at [facility]. CMRN Isaias Cowman made aware. Flo Shanks BSN, RN, [place] [phone]

## 2019-07-15 NOTE — Evaluation (Signed)
Physical Therapy Evaluation Patient Details Name: Colton Lamb MRN: [id number] DOB: [birth year] Today's Date: 07/15/2019   History of Present Illness  Per MD note: Pt is a 68 y.o. male who underwent diverting loop colostomy on 12/02 for fecal management in the setting of stage VI sacral decubitus ulceration with chronic osteomyelitis.  Pt now admitted with postoperative oozing complicated by blood thinners.  PMH includes: CHF, chronic osteomyelitis, CAD, MI, crohn's ileocolitis, GERD, HLD, HTN, and DM II.    Clinical Impression  Pt presented with deficits in strength, transfers, mobility, gait, balance, and activity tolerance.  Pt motivated and actively participated throughout the session.  Pt education provided on log roll technique for decreased caregiver assistance with bed mobility tasks.  Pt presented with posterior instability upon sitting at the EOB but responded well to anterior weight shifting activities and progressed from requiring min A for static sitting to SBA.  Pt made good effort to stand but was ultimately unable to clear the mattress but was able to laterally scoot with mod A.  Pt will benefit from PT services in a SNF setting upon discharge to safely address above deficits for decreased caregiver assistance and eventual return to PLOF.      Follow Up Recommendations SNF    Equipment Recommendations  None recommended by PT    Recommendations for Other Services       Precautions / Restrictions Precautions Precautions: Fall Precaution Comments: sacral wound stage 4, colostomy bag Restrictions Weight Bearing Restrictions: No      Mobility  Bed Mobility Overal bed mobility: Needs Assistance Bed Mobility: Rolling;Sidelying to Sit;Sit to Sidelying Rolling: Min assist Sidelying to sit: Mod assist     Sit to sidelying: Mod assist General bed mobility comments: Mod A for BLE and trunk control with log roll training provided for decreased caregiver  assistance  Transfers Overall transfer level: Needs assistance Equipment used: Standard walker Transfers: Sit to/from Stand;Lateral/Scoot Transfers Sit to Stand: Max assist        Lateral/Scoot Transfers: Mod assist General transfer comment: unable to stand from elevated EOB but was able to laterally scoot with mod A  Ambulation/Gait             General Gait Details: Unable  Stairs            Wheelchair Mobility    Modified Rankin (Stroke Patients Only)       Balance Overall balance assessment: Needs assistance Sitting-balance support: Feet supported;Bilateral upper extremity supported Sitting balance-Leahy Scale: Poor Sitting balance - Comments: Posterior instability in sitting initially requiring min A to correct but progressed to SBA only during the session       Standing balance comment: unable                             Pertinent Vitals/Pain Pain Assessment: No/denies pain    Home Living Family/patient expects to be discharged to:: Skilled nursing facility                      Prior Function Level of Independence: Needs assistance   Gait / Transfers Assistance Needed: Pt reports that while in SNF he has progressed to where he can stand with assistance but has not been able to ambulate     Comments: Prior to hospitalization 03/2019 pt was independent with occasional use of walking stick for longer distances; has been in and out of STR since hospital discharge and  reports having "19 days of my 100 days left at SNF".     Hand Dominance        Extremity/Trunk Assessment   Upper Extremity Assessment Upper Extremity Assessment: Generalized weakness    Lower Extremity Assessment Lower Extremity Assessment: Generalized weakness       Communication   Communication: No difficulties  Cognition Arousal/Alertness: Awake/alert Behavior During Therapy: WFL for tasks assessed/performed Overall Cognitive Status: Within  Functional Limits for tasks assessed                                        General Comments      Exercises Total Joint Exercises Ankle Circles/Pumps: Strengthening;Both;10 reps;15 reps Quad Sets: Strengthening;Both;10 reps;15 reps Gluteal Sets: Strengthening;Both;10 reps;15 reps Short Arc Quad: Strengthening;Both;10 reps Heel Slides: AROM;Both;5 reps Hip ABduction/ADduction: Strengthening;Both;10 reps Long Arc Quad: Strengthening;Both;10 reps Knee Flexion: Strengthening;Both;10 reps Marching in Standing: AROM;Both;5 reps;Seated Other Exercises Other Exercises: Anterior weight shifting activities in sitting to address posterior instability Other Exercises: Seated core therex with lateral weight shifting without UE support Other Exercises: Max effort standing attempts for therex 2 x 5 reps   Assessment/Plan    PT Assessment Patient needs continued PT services  PT Problem List Decreased strength;Decreased activity tolerance;Decreased balance;Decreased mobility;Decreased knowledge of use of DME;Decreased skin integrity       PT Treatment Interventions DME instruction;Gait training;Functional mobility training;Therapeutic activities;Therapeutic exercise;Balance training;Patient/family education    PT Goals (Current goals can be found in the Care Plan section)  Acute Rehab PT Goals Patient Stated Goal: To get back to walking PT Goal Formulation: With patient Time For Goal Achievement: 07/28/19 Potential to Achieve Goals: Fair    Frequency Min 2X/week   Barriers to discharge Decreased caregiver support;Inaccessible home environment      Co-evaluation               AM-PAC PT "6 Clicks" Mobility  Outcome Measure Help needed turning from your back to your side while in a flat bed without using bedrails?: A Little Help needed moving from lying on your back to sitting on the side of a flat bed without using bedrails?: A Lot Help needed moving to and from  a bed to a chair (including a wheelchair)?: Total Help needed standing up from a chair using your arms (e.g., wheelchair or bedside chair)?: Total Help needed to walk in hospital room?: Total Help needed climbing 3-5 steps with a railing? : Total 6 Click Score: 9    End of Session Equipment Utilized During Treatment: Gait belt;Other (comment)(Gait belt under arms secondary to colostomy bag) Activity Tolerance: Patient tolerated treatment well Patient left: in bed;with call bell/phone within reach;with bed alarm set Nurse Communication: Mobility status PT Visit Diagnosis: Muscle weakness (generalized) (M62.81);Difficulty in walking, not elsewhere classified (R26.2)    Time: 0211-1735 PT Time Calculation (min) (ACUTE ONLY): 39 min   Charges:   PT Evaluation $PT Eval Moderate Complexity: 1 Mod PT Treatments $Therapeutic Exercise: 8-22 mins $Therapeutic Activity: 8-22 mins        D. Scott Gauri Galvao PT, DPT 07/15/19, 4:00 PM

## 2019-07-15 NOTE — Progress Notes (Signed)
Peripherally Inserted Central Catheter/Midline Placement  The IV Nurse has discussed with the patient and/or persons authorized to consent for the patient, the purpose of this procedure and the potential benefits and risks involved with this procedure.  The benefits include less needle sticks, lab draws from the catheter, and the patient may be discharged home with the catheter. Risks include, but not limited to, infection, bleeding, blood clot (thrombus formation), and puncture of an artery; nerve damage and irregular heartbeat and possibility to perform a PICC exchange if needed/ordered by physician.  Alternatives to this procedure were also discussed.  Bard Power PICC patient education guide, fact sheet on infection prevention and patient information card has been provided to patient /or left at bedside.    PICC/Midline Placement Documentation  PICC Single Lumen 62/70/35 Right Cephalic 43 cm 0 cm (Active)  Indication for Insertion or Continuance of Line Prolonged intravenous therapies 07/15/19 1718  Exposed Catheter (cm) 0 cm 07/15/19 1718  Site Assessment Clean;Dry;Intact 07/15/19 1718  Line Status Flushed;Blood return noted 07/15/19 1718  Dressing Type Transparent 07/15/19 1718  Dressing Status Clean;Dry;Intact;Antimicrobial disc in place;Other (Comment) 07/15/19 1718  Dressing Intervention New dressing 07/15/19 1718  Dressing Change Due 07/22/19 07/15/19 1718       Christella Noa Albarece 07/15/2019, 5:19 PM

## 2019-07-15 NOTE — TOC Progression Note (Signed)
Transition of Care [facility]) - Progression Note    Patient Details  Name: Colton Lamb MRN: [id number] Date of Birth: 05/31/1951  Transition of Care [facility]) CM/SW Contact  Beverly Sessions, RN Phone Number: 07/15/2019, 4:08 PM  Clinical Narrative:     Patient aware that he only has 19 medicare days left for rehab.  He is aware that [facility] will not take him back  Patient states that he lives at home alone.   Sister in law and 2 sisters live locally.    PCP leaks.  Patient states prior to previous admission he was independent, and still drove.   PT pending.  Patient would like to see how he does with PT, but is considering going home with home health services as this may be his only option.  Agreeable to extend bed search to surrounding counties.  Bed search extended    Expected Discharge Plan: [place] Barriers to Discharge: Continued Medical Work up  Expected Discharge Plan and Services Expected Discharge Plan: [place] Acute Care Choice: Resumption of Svcs/PTA Provider Living arrangements for the past 2 months: [place]                                       Social Determinants of Health (SDOH) Interventions    Readmission Risk Interventions Readmission Risk Prevention Plan 07/12/2019 06/14/2019 04/24/2019  Transportation Screening Complete Complete Complete  Social Work Consult for [place] - - -  Medication Review Press photographer) Complete Complete Complete  PCP or Specialist appointment within 3-5 days of discharge - Complete Complete  HRI or Home Care Consult Not Complete - (No Data)  [place] or Home Care Consult Pt Refusal Comments Return to SNF - -  SW Recovery Care/Counseling Consult Complete Complete Complete  Palliative Care Screening Not Applicable Not Applicable Complete  Skilled Nursing Facility Complete Complete Complete   Some recent data might be hidden

## 2019-07-15 NOTE — Care Management Important Message (Signed)
Important Message  Patient Details  Name: JORDELL OUTTEN MRN: [id number] Date of Birth: 01/31/1951   Medicare Important Message Given:  Yes     Dannette Barbara 07/15/2019, 3:39 PM

## 2019-07-15 NOTE — Progress Notes (Signed)
PIV consult: Pt reports PICC was removed because it was "falling out." Confirmed with Dr. Christian Mate: Pt is receiving Zosyn for chronic osteomyelitis. He will be discharged with PICC. Awaiting SNF placement.  20g PIV placed in L forearm.

## 2019-07-15 NOTE — Progress Notes (Signed)
Cephas Darby, MD [address]  [place]  [place], [place] [address]  Main: [phone]  Fax: [phone] Pager: [phone]   Subjective: Bleeding resolved, pudding like brown stool in the ostomy bag. No GI complaints, anticoagulation restarted   Objective: Vital signs in last 24 hours: Vitals:   07/14/19 2033 07/15/19 0434 07/15/19 0858 07/15/19 1436  BP: 106/65 112/62 111/71 91/65  Pulse: 85 83 83 68  Resp: 20 20    Temp: 98.9 F (37.2 C) 97.9 F (36.6 C)  97.9 F (36.6 C)  TempSrc: Oral Oral  Oral  SpO2: 94% 98%  99%  Weight:      Height:       Weight change:   Intake/Output Summary (Last 24 hours) at 07/15/2019 1923 Last data filed at 07/15/2019 1800 Gross per 24 hour  Intake 554.25 ml  Output 500 ml  Net 54.25 ml     Exam: Heart:: Regular rate and rhythm, S1S2 present or without murmur or extra heart sounds Lungs: normal and clear to auscultation Abdomen: soft, nontender, normal bowel sounds   Lab Results: CBC Latest Ref Rng & Units 07/15/2019 07/12/2019 07/11/2019  WBC 4.0 - 10.5 K/uL - 9.3 10.8(H)  Hemoglobin 13.0 - 17.0 g/dL 8.3(L) 8.2(L) 8.6(L)  Hematocrit 39.0 - 52.0 % 26.7(L) 26.7(L) 28.8(L)  Platelets 150 - 400 K/uL - 182 216   CMP Latest Ref Rng & Units 07/11/2019 07/09/2019 07/08/2019  Glucose 70 - 99 mg/dL 144(H) 104(H) 106(H)  BUN 8 - 23 mg/dL 13 16 16   Creatinine 0.61 - 1.24 mg/dL 0.74 0.79 0.73  Sodium 135 - 145 mmol/L 141 140 141  Potassium 3.5 - 5.1 mmol/L 3.4(L) 3.8 4.2  Chloride 98 - 111 mmol/L 103 102 101  CO2 22 - 32 mmol/L 34(H) 31 34(H)  Calcium 8.9 - 10.3 mg/dL 8.1(L) 8.7(L) 8.7(L)  Total Protein 6.5 - 8.1 g/dL 5.7(L) - -  Total Bilirubin 0.3 - 1.2 mg/dL 0.4 - -  Alkaline Phos 38 - 126 U/L 102 - -  AST 15 - 41 U/L 16 - -  ALT 0 - 44 U/L 12 - -    Micro Results: Recent Results (from the past 240 hour(s))  SARS CORONAVIRUS 2 (TAT 6-24 HRS) Nasopharyngeal Nasopharyngeal Swab     Status: None   Collection  Time: 07/08/19  2:04 PM   Specimen: Nasopharyngeal Swab  Result Value Ref Range Status   SARS Coronavirus 2 NEGATIVE NEGATIVE Final    Comment: (NOTE) SARS-CoV-2 target nucleic acids are NOT DETECTED. The SARS-CoV-2 RNA is generally detectable in upper and lower respiratory specimens during the acute phase of infection. Negative results do not preclude SARS-CoV-2 infection, do not rule out co-infections with other pathogens, and should not be used as the sole basis for treatment or other patient management decisions. Negative results must be combined with clinical observations, patient history, and epidemiological information. The expected result is Negative. Fact Sheet for Patients: SugarRoll.be Fact Sheet for Healthcare Providers: https://www.woods-mathews.com/ This test is not yet approved or cleared by the [place] FDA and  has been authorized for detection and/or diagnosis of SARS-CoV-2 by FDA under an Emergency Use Authorization (EUA). This EUA will remain  in effect (meaning this test can be used) for the duration of the COVID-19 declaration under Section 56 4(b)(1) of the Act, 21 U.S.C. section 360bbb-3(b)(1), unless the authorization is terminated or revoked sooner. Performed at [facility], [place] [address], [place],  [address]   SARS  CORONAVIRUS 2 (TAT 6-24 HRS) Nasopharyngeal Nasopharyngeal Swab     Status: None   Collection Time: 07/11/19  7:43 PM   Specimen: Nasopharyngeal Swab  Result Value Ref Range Status   SARS Coronavirus 2 NEGATIVE NEGATIVE Final    Comment: (NOTE) SARS-CoV-2 target nucleic acids are NOT DETECTED. The SARS-CoV-2 RNA is generally detectable in upper and lower respiratory specimens during the acute phase of infection. Negative results do not preclude SARS-CoV-2 infection, do not rule out co-infections with other pathogens, and should not be used as the sole basis for treatment or other  patient management decisions. Negative results must be combined with clinical observations, patient history, and epidemiological information. The expected result is Negative. Fact Sheet for Patients: SugarRoll.be Fact Sheet for Healthcare Providers: https://www.woods-mathews.com/ This test is not yet approved or cleared by the [place] FDA and  has been authorized for detection and/or diagnosis of SARS-CoV-2 by FDA under an Emergency Use Authorization (EUA). This EUA will remain  in effect (meaning this test can be used) for the duration of the COVID-19 declaration under Section 56 4(b)(1) of the Act, 21 U.S.C. section 360bbb-3(b)(1), unless the authorization is terminated or revoked sooner. Performed at [facility], [place] [address] [place], [place] [address]    Studies/Results: [place] CHEST PORT 1 VIEW  Result Date: 07/15/2019 CLINICAL DATA:  PICC placement. EXAM: PORTABLE CHEST 1 VIEW COMPARISON:  06/15/2019. FINDINGS: Interval right PICC with its tip in the right atrium, approximately 3 cm inferior to the superior cavoatrial junction. Stable enlarged cardiac silhouette and left subclavian pacer and AICD leads. Decreased prominence of the pulmonary vasculature and interstitial markings with decreased bibasilar opacity. Thoracic spine degenerative changes. IMPRESSION: 1. Right PICC tip in the superior aspect of the right atrium, proximally 3 cm inferior to the superior cavoatrial junction. 2. Stable cardiomegaly with improving changes of congestive heart failure. Electronically Signed   By: Claudie Revering M.D.   On: 07/15/2019 17:47   [place] EKG SITE RITE  Result Date: 07/15/2019 If Site Rite image not attached, placement could not be confirmed due to current cardiac rhythm.  Medications:  I have reviewed the patient's current medications. Prior to Admission:  Medications Prior to Admission  Medication Sig Dispense Refill Last Dose  .  aspirin 81 MG tablet Take 81 mg by mouth daily.    07/11/2019 at Unknown time  . atorvastatin (LIPITOR) 80 MG tablet TAKE 1 TABLET BY MOUTH EVERY DAY (Patient taking differently: Take 80 mg by mouth daily. ) 90 tablet 1 07/11/2019 at Unknown time  . balsalazide (COLAZAL) 750 MG capsule TAKE 1 CAPSULE (750 MG TOTAL) BY MOUTH 3 (THREE) TIMES DAILY. (Patient taking differently: Take 750 mg by mouth 3 (three) times daily. ) 270 capsule 3 07/11/2019 at Unknown time  . digoxin (LANOXIN) 0.125 MG tablet Take 1 tablet (0.125 mg total) by mouth daily. 30 tablet 0 07/11/2019 at Unknown time  . finasteride (PROSCAR) 5 MG tablet Take 1 tablet (5 mg total) by mouth daily. 30 tablet 0 07/11/2019 at Unknown time  . insulin aspart (NOVOLOG) 100 UNIT/ML injection Inject 5 Units into the skin 3 (three) times daily with meals. (Patient taking differently: Inject 5 Units into the skin 3 (three) times daily before meals. ) 10 mL 11 07/11/2019 at Unknown time  . lactobacillus (FLORANEX/LACTINEX) PACK Take 1 packet (1 g total) by mouth 3 (three) times daily with meals. 90 each 0 07/11/2019 at Unknown time  . metoprolol succinate (TOPROL-XL) 25 MG 24  hr tablet Take 1 tablet (25 mg total) by mouth daily. 30 tablet 0 07/11/2019 at Unknown time  . midodrine (PROAMATINE) 10 MG tablet Take 1 tablet (10 mg total) by mouth 3 (three) times daily. 30 tablet 0 07/11/2019 at Unknown time  . Multiple Vitamins-Minerals (DECUBI-VITE PO) Take by mouth daily.   07/11/2019 at Unknown time  . niacin (NIASPAN) 1000 MG CR tablet Take 1,000 mg by mouth daily.   07/11/2019 at Unknown time  . omeprazole (PRILOSEC) 20 MG capsule Take 20 mg by mouth daily.    07/11/2019 at Unknown time  . piperacillin-tazobactam (ZOSYN) 3.375 GM/50ML IVPB Inject 50 mLs (3.375 g total) into the vein every 6 (six) hours for 15 days. 50 mL 0 07/11/2019 at Unknown time  . potassium chloride (KLOR-CON) 10 MEQ tablet Take 1 tablet (10 mEq total) by mouth daily. 30 tablet 3  07/11/2019 at Unknown time  . tamsulosin (FLOMAX) 0.4 MG CAPS capsule Take 1 capsule (0.4 mg total) by mouth daily after breakfast. 30 capsule 0 07/11/2019 at Unknown time  . torsemide (DEMADEX) 20 MG tablet Take 2 tablets (40 mg) by mouth once daily 60 tablet 3 07/11/2019 at Unknown time  . vitamin C (VITAMIN C) 500 MG tablet Take 1 tablet (500 mg total) by mouth 2 (two) times daily. 30 tablet 0 07/11/2019 at Unknown time  . XARELTO 20 MG TABS tablet TAKE 1 TABLET BY MOUTH EVERY DAY WITH LUNCH 90 tablet 3 07/11/2019 at Unknown time  . acetaminophen (TYLENOL) 325 MG tablet Take 1-2 tablets (325-650 mg total) by mouth every 4 (four) hours as needed for mild pain.   PRN at PRN  . Albuterol Sulfate 108 (90 Base) MCG/ACT AEPB Inhale 1 puff into the lungs every 6 (six) hours as needed (shortness of breath).    PRN at PRN  . HYDROcodone-acetaminophen (NORCO/VICODIN) 5-325 MG tablet Take 1 tablet by mouth every 6 (six) hours as needed for severe pain. 10 tablet 0 PRN at PRN  . Nutritional Supplements (FEEDING SUPPLEMENT, NEPRO CARB STEADY,) LIQD Take 237 mLs by mouth 2 (two) times daily between meals. 14220 mL 0    Scheduled: . atorvastatin  80 mg Oral q1800  . Chlorhexidine Gluconate Cloth  6 each Topical Daily  . digoxin  0.125 mg Oral Daily  . finasteride  5 mg Oral Daily  . insulin aspart  5 Units Subcutaneous TID WC  . lactobacillus  1 g Oral TID WC  . metoprolol succinate  25 mg Oral Daily  . midodrine  10 mg Oral TID  . multivitamin with minerals   Oral Daily  . niacin  1,000 mg Oral Daily  . pantoprazole  40 mg Oral Daily  . potassium chloride  10 mEq Oral Daily  . rivaroxaban  20 mg Oral Daily  . tamsulosin  0.4 mg Oral QPC breakfast  . torsemide  40 mg Oral Daily  . ascorbic acid  500 mg Oral BID   Continuous: . sodium chloride 10 mL (07/15/19 1844)  . piperacillin-tazobactam 3.375 g (07/15/19 1541)   GXQ:JJHERD chloride, acetaminophen, albuterol, HYDROcodone-acetaminophen,  ondansetron **OR** ondansetron (ZOFRAN) IV, simethicone Anti-infectives (From admission, onward)   Start     Dose/Rate Route Frequency Ordered Stop   07/11/19 2200  piperacillin-tazobactam (ZOSYN) IVPB 3.375 g     3.375 g 12.5 mL/hr over 240 Minutes Intravenous Every 8 hours 07/11/19 1901       Scheduled Meds: . atorvastatin  80 mg Oral q1800  . Chlorhexidine Gluconate Cloth  6 each Topical Daily  . digoxin  0.125 mg Oral Daily  . finasteride  5 mg Oral Daily  . insulin aspart  5 Units Subcutaneous TID WC  . lactobacillus  1 g Oral TID WC  . metoprolol succinate  25 mg Oral Daily  . midodrine  10 mg Oral TID  . multivitamin with minerals   Oral Daily  . niacin  1,000 mg Oral Daily  . pantoprazole  40 mg Oral Daily  . potassium chloride  10 mEq Oral Daily  . rivaroxaban  20 mg Oral Daily  . tamsulosin  0.4 mg Oral QPC breakfast  . torsemide  40 mg Oral Daily  . ascorbic acid  500 mg Oral BID   Continuous Infusions: . sodium chloride 10 mL (07/15/19 1844)  . piperacillin-tazobactam 3.375 g (07/15/19 1541)   PRN Meds:.sodium chloride, acetaminophen, albuterol, HYDROcodone-acetaminophen, ondansetron **OR** ondansetron (ZOFRAN) IV, simethicone   Assessment: Active Problems:   Bleeding  Bleed from the ostomy, transient, currently resolved  Plan: No further work-up from GI standpoint Xarelto is resumed Feraheme ordered due to iron deficiency anemia Follow-up with GI as outpatient if needed GI will sign off at this time.  Please call us back with questions or concerns   LOS: 3 days   Tashika Goodin 07/15/2019, 7:23 PM

## 2019-07-15 NOTE — Progress Notes (Addendum)
[facility] Hospital Day(s): 3.   Post op day(s):   11.   Interval History: Patient seen and examined, no acute events or new complaints overnight. Patient reports no bleeding from stoma. Having good bowel function. No other issues. Awaiting placement.  Vital signs in last 24 hours: [min-max] current  Temp:  [97.5 F (36.4 C)-98.9 F (37.2 C)] 97.9 F (36.6 C) (12/14 0434) Pulse Rate:  [73-85] 83 (12/14 0434) Resp:  [20-24] 20 (12/14 0434) BP: (96-112)/(62-65) 112/62 (12/14 0434) SpO2:  [94 %-98 %] 98 % (12/14 0434)     Height: 5' 11"  (180.3 cm) Weight: 111.3 kg BMI (Calculated): 34.24   Intake/Output last 2 shifts:  12/13 0701 - 12/14 0700 In: 674.3 [P.O.:360; I.V.:125.3; IV Piggyback:189] Out: 250 [Stool:250]   Physical Exam:  Constitutional: alert, cooperative and no distress  Respiratory: breathing non-labored at rest  Cardiovascular: regular rate and sinus rhythm  Gastrointestinal: soft, non-tender, and non-distended Stoma: Appearance is no worse with diminishing slough, no frank necrosis.  Semi-solid stool and gas in bag, no gross blood.  Labs:  CBC Latest Ref Rng & Units 07/15/2019 07/12/2019 07/11/2019  WBC 4.0 - 10.5 K/uL - 9.3 10.8(H)  Hemoglobin 13.0 - 17.0 g/dL 8.3(L) 8.2(L) 8.6(L)  Hematocrit 39.0 - 52.0 % 26.7(L) 26.7(L) 28.8(L)  Platelets 150 - 400 K/uL - 182 216   CMP Latest Ref Rng & Units 07/11/2019 07/09/2019 07/08/2019  Glucose 70 - 99 mg/dL 144(H) 104(H) 106(H)  BUN 8 - 23 mg/dL 13 16 16   Creatinine 0.61 - 1.24 mg/dL 0.74 0.79 0.73  Sodium 135 - 145 mmol/L 141 140 141  Potassium 3.5 - 5.1 mmol/L 3.4(L) 3.8 4.2  Chloride 98 - 111 mmol/L 103 102 101  CO2 22 - 32 mmol/L 34(H) 31 34(H)  Calcium 8.9 - 10.3 mg/dL 8.1(L) 8.7(L) 8.7(L)  Total Protein 6.5 - 8.1 g/dL 5.7(L) - -  Total Bilirubin 0.3 - 1.2 mg/dL 0.4 - -  Alkaline Phos 38 - 126 U/L 102 - -  AST 15 - 41 U/L 16 - -  ALT 0 - 44 U/L 12 - -   Imaging  studies: No new pertinent imaging studies   Assessment/Plan:  68 y.o. male with recent colostomy creation   s/p postoperative oozing complicated by blood thinners.    - Restart anticoagulation  - continue osotomy care/sacral decubitus care per WOC RN  - awaiting placement  -- Edison Simon, PA-C [facility] 07/15/2019, 9:01 AM [phone] M-F: 7am - 4pm

## 2019-07-16 LAB — GLUCOSE, CAPILLARY
Glucose-Capillary: 85 mg/dL (ref 70–99)
Glucose-Capillary: 101 mg/dL — ABNORMAL HIGH (ref 70–99)
Glucose-Capillary: 103 mg/dL — ABNORMAL HIGH (ref 70–99)

## 2019-07-16 NOTE — Progress Notes (Signed)
Otho Ket, PA ordered to check CBG tid AC.

## 2019-07-16 NOTE — Consult Note (Addendum)
[place] Nurse wound follow up Wound type:sacral wound.  Bedside RN has been managing this care since topical orders were implemented.  I will measure and assess this wound when back on campus 07/17/19.  [place] Nurse ostomy follow up Stoma type/location: LLQ colostomy  Patient refused ostomy teaching up to this point stating he would be returning to SNF.  He now may discharge home with [facility] and is interested in taking a role in self care.  We will begin teaching ostomy pouch changes.  Stomal assessment/size: 2 1/4"  Peristomal assessment: not assessed Treatment options for stomal/peristomal skin: would like to pouch in 2 3/4" pouch with barrier ring Output soft brown stool Ostomy pouching: 2pc.  Education provided:  Patient is now eager to learn self care of his ostomy.  He has limited days in rehab and may go home with [facility].  Enrolled patient in [place] Start Discharge program: Yes will today.  [place] team will follow.  Domenic Moras MSN, RN, FNP-BC CWON Wound, Ostomy, Continence Nurse Pager [phone]

## 2019-07-16 NOTE — Progress Notes (Addendum)
[place] Hospital Day(s): 4.   Post op day(s):  12.   Interval History:  Patient seen and examined no acute events or new complaints overnight.  Patient reports no bleeding from stoma.  Having good bowel function.  No other issues.  Awaiting placement - challenging as he only has limited days left, CSw following.  Vital signs in last 24 hours: [min-max] current  Temp:  [97.9 F (36.6 C)-98.8 F (37.1 C)] 97.9 F (36.6 C) (12/15 0459) Pulse Rate:  [68-83] 77 (12/15 0459) Resp:  [20] 20 (12/15 0459) BP: (91-112)/(65-71) 101/65 (12/15 0459) SpO2:  [97 %-99 %] 97 % (12/15 0459)     Height: 5' 11"  (180.3 cm) Weight: 111.3 kg BMI (Calculated): 34.24   Intake/Output last 2 shifts:  12/14 0701 - 12/15 0700 In: 240 [P.O.:240] Out: 450 [Stool:450]   Physical Exam:  Constitutional: alert, cooperative and no distress  Respiratory: breathing non-labored at rest  Cardiovascular: regular rate and sinus rhythm  Gastrointestinal: soft, non-tender, and non-distended Stoma: Appearance is no worse with diminishing slough, no frank necrosis.  Semi-solid stool and gas in bag, no gross blood.  Labs:  CBC Latest Ref Rng & Units 07/15/2019 07/12/2019 07/11/2019  WBC 4.0 - 10.5 K/uL - 9.3 10.8(H)  Hemoglobin 13.0 - 17.0 g/dL 8.3(L) 8.2(L) 8.6(L)  Hematocrit 39.0 - 52.0 % 26.7(L) 26.7(L) 28.8(L)  Platelets 150 - 400 K/uL - 182 216   CMP Latest Ref Rng & Units 07/11/2019 07/09/2019 07/08/2019  Glucose 70 - 99 mg/dL 144(H) 104(H) 106(H)  BUN 8 - 23 mg/dL 13 16 16   Creatinine 0.61 - 1.24 mg/dL 0.74 0.79 0.73  Sodium 135 - 145 mmol/L 141 140 141  Potassium 3.5 - 5.1 mmol/L 3.4(L) 3.8 4.2  Chloride 98 - 111 mmol/L 103 102 101  CO2 22 - 32 mmol/L 34(H) 31 34(H)  Calcium 8.9 - 10.3 mg/dL 8.1(L) 8.7(L) 8.7(L)  Total Protein 6.5 - 8.1 g/dL 5.7(L) - -  Total Bilirubin 0.3 - 1.2 mg/dL 0.4 - -  Alkaline Phos 38 - 126 U/L 102 - -  AST 15 - 41 U/L 16 - -  ALT  0 - 44 U/L 12 - -   Imaging studies: No new pertinent imaging studies   Assessment/Plan:  68 y.o. male with recent colostomy creation 12 days s/p postoperative oozing complicated by blood thinners.    - Continue diet as tolerates  - pain control prn  - monitor abdominal examination  - Continue to work with PT - recommending SNF (see discharge planning)  - continue osotomy care/sacral decubitus care per WOC RN    - awaiting placement/discharge planning - CSW following, issues with limited number of days. Working to find safe discharge plan for the patient  -- Edison Simon, PA-C [facility] 07/16/2019, 7:43 AM [phone] M-F: 7am - 4pm Pt seen and examined I agree w above. No surgical issues, ostomy working w/o bleeding. Pending placement

## 2019-07-16 NOTE — TOC Progression Note (Signed)
Transition of Care [facility]) - Progression Note    Patient Details  Name: Colton Lamb MRN: [id number] Date of Birth: 19-Jun-1951  Transition of Care [facility]) CM/SW Contact  Beverly Sessions, RN Phone Number: 07/16/2019, 2:15 PM  Clinical Narrative:     Followed up with patient today to discuss discharge plan.  Sister in law at bedside.   Patient was limited with participated with PT. Sister law expressed concerns about him returning home alone.   Patient currently still with no bed offers.   Patient is agreeable to SNF, and applying for Medicaid if I can find a facility.    RNCM reached out to [place] at [facility].  My self and the patients spoke with kelly on the phone.  She is going to reach out to her leadership to determine if they can accept the patient.  Patient in agreement.  Referral resent in the hub  Expected Discharge Plan: [place] Barriers to Discharge: Continued Medical Work up  Expected Discharge Plan and Services Expected Discharge Plan: [place] Acute Care Choice: Resumption of Svcs/PTA Provider Living arrangements for the past 2 months: [place]                                       Social Determinants of Health (SDOH) Interventions    Readmission Risk Interventions Readmission Risk Prevention Plan 07/12/2019 06/14/2019 04/24/2019  Transportation Screening Complete Complete Complete  Social Work Consult for [place] - - -  Medication Review Press photographer) Complete Complete Complete  PCP or Specialist appointment within 3-5 days of discharge - Complete Complete  HRI or Home Care Consult Not Complete - (No Data)  [place] or Home Care Consult Pt Refusal Comments Return to SNF - -  SW Recovery Care/Counseling Consult Complete Complete Complete  Palliative Care Screening Not Applicable Not Applicable Complete  Skilled Nursing  Facility Complete Complete Complete  Some recent data might be hidden

## 2019-07-17 ENCOUNTER — Ambulatory Visit: Payer: Medicare Other | Admitting: Family

## 2019-07-17 LAB — GLUCOSE, CAPILLARY
Glucose-Capillary: 92 mg/dL (ref 70–99)
Glucose-Capillary: 102 mg/dL — ABNORMAL HIGH (ref 70–99)
Glucose-Capillary: 147 mg/dL — ABNORMAL HIGH (ref 70–99)

## 2019-07-17 LAB — CBC
HCT: 24.9 % — ABNORMAL LOW (ref 39.0–52.0)
Hemoglobin: 7.8 g/dL — ABNORMAL LOW (ref 13.0–17.0)
MCH: 24.9 pg — ABNORMAL LOW (ref 26.0–34.0)
MCHC: 31.3 g/dL (ref 30.0–36.0)
MCV: 79.6 fL — ABNORMAL LOW (ref 80.0–100.0)
Platelets: 247 10*3/uL (ref 150–400)
RBC: 3.13 MIL/uL — ABNORMAL LOW (ref 4.22–5.81)
RDW: 19.3 % — ABNORMAL HIGH (ref 11.5–15.5)
WBC: 10 10*3/uL (ref 4.0–10.5)
nRBC: 0 % (ref 0.0–0.2)

## 2019-07-17 LAB — SARS CORONAVIRUS 2 (TAT 6-24 HRS): SARS Coronavirus 2: NEGATIVE

## 2019-07-17 NOTE — Progress Notes (Signed)
Physical Therapy Treatment Patient Details Name: Colton Lamb MRN: [id number] DOB: 02/04/1951 Today's Date: 07/17/2019    History of Present Illness Per MD note: Pt is a 68 y.o. male who underwent diverting loop colostomy on 12/02 for fecal management in the setting of stage VI sacral decubitus ulceration with chronic osteomyelitis.  Pt now admitted with postoperative oozing complicated by blood thinners.  PMH includes: CHF, chronic osteomyelitis, CAD, MI, crohn's ileocolitis, GERD, HLD, HTN, and DM II.    PT Comments    Pt presented with deficits in strength, transfers, mobility, gait, balance, and activity tolerance.  Pt pleasant and motivated to participate throughout the session.  Pt continued to require physical assistance with bed mobility tasks and lateral scooting at the EOB and again was unable to clear the EOB during multiple sit to stand attempts from various bed heights.  Pt with noted pitting edema on the L lateral thigh, nursing notified.  Pt will benefit from PT services in a SNF setting upon discharge to safely address above deficits for decreased caregiver assistance and eventual return to PLOF.     Follow Up Recommendations  SNF     Equipment Recommendations  None recommended by PT    Recommendations for Other Services       Precautions / Restrictions Precautions Precautions: Fall Precaution Comments: sacral wound stage 4, colostomy bag Restrictions Weight Bearing Restrictions: No    Mobility  Bed Mobility Overal bed mobility: Needs Assistance Bed Mobility: Rolling;Sidelying to Sit;Sit to Sidelying Rolling: Min assist Sidelying to sit: Max assist;HOB elevated Supine to sit: Mod assist;+2 for physical assistance     General bed mobility comments: Mod-max A for BLE and trunk control with log roll training provided for decreased caregiver assistance  Transfers Overall transfer level: Needs assistance Equipment used: Standard walker Transfers: Sit  to/from Stand;Lateral/Scoot Transfers Sit to Stand: Max assist        Lateral/Scoot Transfers: Mod assist General transfer comment: unable to stand from elevated EOB but was able to laterally scoot with mod A by pulling on the draw sheet as pt scooted  Ambulation/Gait                 Stairs             Wheelchair Mobility    Modified Rankin (Stroke Patients Only)       Balance Overall balance assessment: Needs assistance Sitting-balance support: Feet supported;Bilateral upper extremity supported Sitting balance-Leahy Scale: Poor Sitting balance - Comments: Posterior instability in sitting initially requiring min A to correct but progressed to SBA only during the session   Standing balance support: Bilateral upper extremity supported   Standing balance comment: unable                            Cognition Arousal/Alertness: Awake/alert Behavior During Therapy: WFL for tasks assessed/performed Overall Cognitive Status: Within Functional Limits for tasks assessed                                        Exercises Total Joint Exercises Ankle Circles/Pumps: Strengthening;Both;10 reps;15 reps Quad Sets: Strengthening;Both;10 reps;15 reps Gluteal Sets: Strengthening;Both;10 reps;15 reps Towel Squeeze: AROM;Both;10 reps Short Arc Quad: Strengthening;Both;10 reps Heel Slides: AROM;Both;AAROM;10 reps Hip ABduction/ADduction: Both;10 reps;AROM;AAROM;5 reps Straight Leg Raises: AROM;Both;10 reps;AAROM;5 reps Long Arc Quad: Strengthening;Both;10 reps;5 reps Knee Flexion: Strengthening;Both;5 reps;10 reps Other  Exercises Other Exercises: Anterior weight shifting activities in sitting to address posterior instability Other Exercises: HEP education and review for BLE APs, QS, and GS x 10 each every 1-2 hours daily and B hip abd/add x 10 twice daily Other Exercises: Rolling left/right with cues for sequencing  Sit to stand max attempts 2 x 5  for isometric BLE therex    General Comments        Pertinent Vitals/Pain Pain Assessment: No/denies pain    Home Living                      Prior Function            PT Goals (current goals can now be found in the care plan section) Progress towards PT goals: Progressing toward goals    Frequency    Min 2X/week      PT Plan Current plan remains appropriate    Co-evaluation              AM-PAC PT "6 Clicks" Mobility   Outcome Measure  Help needed turning from your back to your side while in a flat bed without using bedrails?: A Little Help needed moving from lying on your back to sitting on the side of a flat bed without using bedrails?: A Lot Help needed moving to and from a bed to a chair (including a wheelchair)?: Total Help needed standing up from a chair using your arms (e.g., wheelchair or bedside chair)?: Total Help needed to walk in hospital room?: Total Help needed climbing 3-5 steps with a railing? : Total 6 Click Score: 9    End of Session Equipment Utilized During Treatment: Gait belt;Other (comment)(Gait belt uner arms to avoid colostomy bag)   Patient left: in bed;with call bell/phone within reach;with bed alarm set;with nursing/sitter in room;Other (comment)(Prevalon boots donned with pt turned to L per nsg direction) Nurse Communication: Mobility status;Other (comment)(Pt with noted pitting edema to L lateral thigh) PT Visit Diagnosis: Muscle weakness (generalized) (M62.81);Difficulty in walking, not elsewhere classified (R26.2)     Time: 1855-0158 PT Time Calculation (min) (ACUTE ONLY): 39 min  Charges:  $Therapeutic Exercise: 23-37 mins $Therapeutic Activity: 8-22 mins                     D. Scott Tiki Tucciarone PT, DPT 07/17/19, 5:17 PM

## 2019-07-17 NOTE — Progress Notes (Addendum)
[place] Hospital Day(s): 5.   Post op day(s):  13.   Interval History:  Patient seen and examined no acute events or new complaints overnight.  Patient reports no bleeding from stoma.  Having good bowel function.  No other issues.  Awaiting placement - challenging as he only has limited days left, CSW following.  Vital signs in last 24 hours: [min-max] current  Temp:  [97.7 F (36.5 C)-98.2 F (36.8 C)] 98.2 F (36.8 C) (12/16 0445) Pulse Rate:  [61-71] 69 (12/16 0445) Resp:  [20] 20 (12/16 0445) BP: (96-145)/(57-125) 96/57 (12/16 0445) SpO2:  [97 %-99 %] 98 % (12/16 0445)     Height: 5' 11"  (180.3 cm) Weight: 111.3 kg BMI (Calculated): 34.24   Intake/Output last 2 shifts:  12/15 0701 - 12/16 0700 In: 69 [P.O.:960] Out: 500 [Urine:450; Stool:50]   Physical Exam:  Constitutional: alert, cooperative and no distress  Respiratory: breathing non-labored at rest  Cardiovascular: regular rate and sinus rhythm  Gastrointestinal: soft, non-tender, and non-distended Stoma: Appearance is no worse with diminishing slough, no frank necrosis.  Semi-solid stool and gas in bag, no gross blood.  Labs:  CBC Latest Ref Rng & Units 07/17/2019 07/15/2019 07/12/2019  WBC 4.0 - 10.5 K/uL 10.0 - 9.3  Hemoglobin 13.0 - 17.0 g/dL 7.8(L) 8.3(L) 8.2(L)  Hematocrit 39.0 - 52.0 % 24.9(L) 26.7(L) 26.7(L)  Platelets 150 - 400 K/uL 247 - 182   CMP Latest Ref Rng & Units 07/11/2019 07/09/2019 07/08/2019  Glucose 70 - 99 mg/dL 144(H) 104(H) 106(H)  BUN 8 - 23 mg/dL 13 16 16   Creatinine 0.61 - 1.24 mg/dL 0.74 0.79 0.73  Sodium 135 - 145 mmol/L 141 140 141  Potassium 3.5 - 5.1 mmol/L 3.4(L) 3.8 4.2  Chloride 98 - 111 mmol/L 103 102 101  CO2 22 - 32 mmol/L 34(H) 31 34(H)  Calcium 8.9 - 10.3 mg/dL 8.1(L) 8.7(L) 8.7(L)  Total Protein 6.5 - 8.1 g/dL 5.7(L) - -  Total Bilirubin 0.3 - 1.2 mg/dL 0.4 - -  Alkaline Phos 38 - 126 U/L 102 - -  AST 15 - 41 U/L 16 -  -  ALT 0 - 44 U/L 12 - -   Imaging studies: No new pertinent imaging studies   Assessment/Plan:  68 y.o. male with recent colostomy creation 13 days s/p postoperative oozing complicated by blood thinners.    - Continue diet as tolerates  - pain control prn  - monitor abdominal examination  - Continue to work with PT - recommending SNF (see discharge planning)  - continue osotomy care/sacral decubitus care per WOC RN    - awaiting placement/discharge planning - CSW following, issues with limited number of days. Working to find safe discharge plan for the patient  -- Edison Simon, PA-C [facility] 07/17/2019, 7:29 AM [phone] M-F: 7am - 4pm Pt seen and examined I agree w above. No surgical issues, ostomy working w/o bleeding. Pending placement

## 2019-07-17 NOTE — TOC Transition Note (Signed)
Transition of Care [facility]) - CM/SW Discharge Note   Patient Details  Name: Colton Lamb MRN: [id number] Date of Birth: 1951-03-18  Transition of Care [facility]) CM/SW Contact:  Beverly Sessions, RN Phone Number: 07/17/2019, 3:50 PM   Clinical Narrative:      [name] at [facility] is able to accept patient.  They will help patient apply for long term Medicaid.  Patient is coming on the agreement with [place] that he will discharge home on Jan 1st 2021, when his Medicare days expire.   Zach PA update.  To order repeat covid test.  Plan to discharge patient 1st thing in the morning    Barriers to Discharge: Continued Medical Work up   Patient Goals and CMS Choice        Discharge Placement                       Discharge Plan and Services     Post Acute Care Choice: Resumption of Svcs/PTA Provider                               Social Determinants of Health (SDOH) Interventions     Readmission Risk Interventions Readmission Risk Prevention Plan 07/12/2019 06/14/2019 04/24/2019  Transportation Screening Complete Complete Complete  Social Work Consult for [place] - - -  Medication Review Press photographer) Complete Complete Complete  PCP or Specialist appointment within 3-5 days of discharge - Complete Complete  HRI or [place] Not Complete - (No Data)  [place] or Home Care Consult Pt Refusal Comments Return to SNF - -  SW Recovery Care/Counseling Consult Complete Complete Complete  Palliative Care Screening Not Applicable Not Applicable Complete  Skilled Nursing Facility Complete Complete Complete  Some recent data might be hidden

## 2019-07-17 NOTE — Consult Note (Addendum)
[place] Nurse ostomy follow up Stoma type/location: LLQ colostomy Stomal assessment/size: Oval shaped:  Length:  1.3 cm x width 3.5 cm pink and moist  Slightly budded, in abdominal creasing at 3 and 9 o'clock   Peristomal assessment: abdominal hair is trimmed with scissors today.   Treatment options for stomal/peristomal skin: barrier ring and 2 piece 2 3/4" pouch in room.  Output soft brown stool Ostomy pouching: 2pc. 2 3/4" pouch with barrier ring  Education provided:  Patient engaged in learning.  Discussed twice weekly pouch changes.  Removed pouch and cleansed with soap and water.  Applied barrier ring.  Patient able to cut oval shaped opening when I drew it for him.  He snapped pouch and barrier together and applied with assistance.  Able to open and roll closed with minimal assistance  Agrees to practice emptying with nurse techs when 1/3 full Enrolled patient in [place] Start Discharge program: Yes  [place] team will follow.  [place] Nurse wound follow up Wound type: Chronic stage 4 pressure injury Measurement: 9 cm x 7 cm x 5 cm with undermining present.  Wound FWY:OVZCH red Drainage (amount, consistency, odor) moderate serosanguinous  No odor Periwound:intact Dressing procedure/placement/frequency:Patient needs intense encouragement to turn and reposition.  He only likes to lie on his back. This will be a challenge and detrimental to his healing. He is on a low air loss mattress.  Continue NS moist dressings per surgical team.  Will not follow sacral wound at this time.  Please re-consult if needed.  Domenic Moras MSN, RN, FNP-BC CWON Wound, Ostomy, Continence Nurse Pager [phone]

## 2019-07-17 NOTE — TOC Progression Note (Signed)
Transition of Care [facility]) - Progression Note    Patient Details  Name: Colton Lamb MRN: [id number] Date of Birth: 02/28/1951  Transition of Care [facility]) CM/SW Contact  Beverly Sessions, RN Phone Number: 07/17/2019, 8:59 AM  Clinical Narrative:    Message left for [name] at [facility] to determine if she had an update    Expected Discharge Plan: Skilled Nursing Facility Barriers to Discharge: Continued Medical Work up  Expected Discharge Plan and Services Expected Discharge Plan: [place] Acute Care Choice: Resumption of Svcs/PTA Provider Living arrangements for the past 2 months: [place]                                       Social Determinants of Health (SDOH) Interventions    Readmission Risk Interventions Readmission Risk Prevention Plan 07/12/2019 06/14/2019 04/24/2019  Transportation Screening Complete Complete Complete  Social Work Consult for [place] - - -  Medication Review Press photographer) Complete Complete Complete  PCP or Specialist appointment within 3-5 days of discharge - Complete Complete  HRI or Home Care Consult Not Complete - (No Data)  [place] or Home Care Consult Pt Refusal Comments Return to SNF - -  SW Recovery Care/Counseling Consult Complete Complete Complete  Palliative Care Screening Not Applicable Not Applicable Complete  Skilled Nursing Facility Complete Complete Complete  Some recent data might be hidden

## 2019-07-18 LAB — GLUCOSE, CAPILLARY: Glucose-Capillary: 92 mg/dL (ref 70–99)

## 2019-07-18 MED ORDER — PIPERACILLIN-TAZOBACTAM 3.375 G IVPB 30 MIN: 3.3750 g | Freq: Once | INTRAVENOUS | Status: DC

## 2019-07-18 MED ORDER — PIPERACILLIN-TAZOBACTAM 3.375 G IVPB 30 MIN
3.3750 g | Freq: Once | INTRAVENOUS | Status: AC
  Administered 2019-07-18: 3.375 g via INTRAVENOUS
  Filled 2019-07-18: qty 50

## 2019-07-18 NOTE — TOC Transition Note (Signed)
Transition of Care [facility]) - CM/SW Discharge Note   Patient Details  Name: Colton Lamb MRN: [id number] Date of Birth: Sep 03, 1950  [facility] of Care [name]) CM/SW Contact:  Beverly Sessions, RN Phone Number: 07/18/2019, 9:55 AM   Clinical Narrative:    Patient to discharge to [facility] today  Discharge Clinical sent in [place]  EMS packet on chart.  Bedside RN notified  Sister in law notified of discharge.    Final next level of care: Skilled Nursing Facility Barriers to Discharge: No Barriers Identified   Patient Goals and CMS Choice        Discharge Placement              Patient chooses bed at: [facility] Patient to be transferred to facility by: EMS Name of family member notified: Blanch Patient and family notified of of transfer: 07/18/19  Discharge Plan and Services     Post Acute Care Choice: Resumption of Svcs/PTA Provider                               Social Determinants of Health (SDOH) Interventions     Readmission Risk Interventions Readmission Risk Prevention Plan 07/12/2019 06/14/2019 04/24/2019  Transportation Screening Complete Complete Complete  Social Work Consult for [place] - - -  Medication Review Press photographer) Complete Complete Complete  PCP or Specialist appointment within 3-5 days of discharge - Complete Complete  HRI or [place] Not Complete - (No Data)  [place] or Home Care Consult Pt Refusal Comments Return to SNF - -  SW Recovery Care/Counseling Consult Complete Complete Complete  Palliative Care Screening Not Applicable Not Applicable Complete  Skilled Nursing Facility Complete Complete Complete  Some recent data might be hidden

## 2019-07-18 NOTE — Discharge Summary (Signed)
[facility]GICAL ASSOCIATES SURGICAL DISCHARGE SUMMARY  Patient ID: Colton Lamb MRN: [id number] DOB/AGE: [birth year] 68 y.o.  Admit date: 07/11/2019 Discharge date: 07/18/2019  Discharge Diagnoses Patient Active Problem List   Diagnosis Date Noted  . Bleeding 07/11/2019  . Chronic osteomyelitis ([place]) 06/30/2019  . PICC (peripherally inserted central catheter) flush   . Normocytic anemia 06/21/2019  . Stage IV pressure ulcer of sacral region ([place]) 06/19/2019  . Hypotension 06/19/2019  . Anemia   . Acute respiratory failure ([place]) 06/12/2019  . Acute on chronic congestive heart failure ([place])   . Acute blood loss anemia   . Pressure injury of skin 03/17/2019  . Acute renal failure (ARF) ([place])   . Empyema lung ([place])   . Shortness of breath   . Pleural effusion on right   . Sepsis ([name]) 03/04/2019  . Restless legs syndrome (RLS) 06/16/2017  . Hypertension, essential 06/16/2017  . CAD in native artery 04/27/2017  . Hydrocephalus ([place]) 09/08/2016  . Dizziness and giddiness   . Elevated troponin 09/06/2016  . Type II diabetes mellitus ([place])   . Ischemic cardiomyopathy   . Hyponatremia 07/01/2015  . Diabetes mellitus type 2, uncontrolled, with complications ([place]) 65/46/5035  . 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 OSA (obstructive sleep apnea) 12/20/2013  . Morbid obesity ([place]) 12/20/2013  . AKI (acute kidney injury) - Creatinine improved at d/c 12/14/2013  . Elevated TSH - will need f/u TFTs with PCP in 3-4 weeks 12/14/2013  . Junctional bradycardia - resolved 12/14/2013  . Syncope - due to bradycardia in setting of Digoxin Toxicity 12/11/2013  . Acute on chronic HFrEF (heart failure with reduced ejection fraction) ([place]) 12/06/2013  . At risk for sudden cardiac death - on LifeVest Dec 22, 2013  . Acute on chronic combined systolic and diastolic CHF (congestive heart failure) ([place]) Dec 22, 2013  . Cardiomyopathy, ischemic 12-22-2013  . Atrial  flutter ([place]) 11/22/2013  . NSTEMI (non-ST elevated myocardial infarction) ([place]) 11/22/2013  . Long term current use of anticoagulant 10/19/2010  . Hyperlipidemia 05/06/2010  . Edema 05/06/2010  . CAD S/P percutaneous coronary angioplasty - multiple PCIs 11/11/2008  . Atrial fibrillation ([place]) 11/11/2008  . GERD 11/11/2008  . [place] INTESTINE 11/11/2008  . COLONIC POLYPS, HX OF 11/11/2008  . [place] ALLERGY 11/11/2008    Consultants None  Procedures None  HPI: Colton Lamb is a 68 y.o. male who underwent diverting loop colostomy on 12/02 with Dr Christian Mate for stool diversion given stage VI sacral ulceration. He did well and was discharged on 12/08. He represented to the hospital on 12/10 for bleeding from his ostomy. This was exacerbated by his Xarelto use. He was admitted to general surgery for observation.   Hospital Course: His Xarelto was held and the bleeding from the ostomy stopped. He continued to have good ostomy function and ultimately his anti-coagulation was restarted. He had issues with placement to rehab facility which was his biggest barrier to discharge.   Discharge Condition: Good   Physical Examination:  Constitutional: alert, cooperative and no distress  Respiratory: breathing non-labored at rest  Cardiovascular: regular rate and sinus rhythm  Gastrointestinal: soft, non-tender, and non-distended Stoma: Appearance is no worse with diminishing slough, no frank necrosis.  Semi-solid stool and gas in bag, no gross blood.   Allergies as of 07/18/2019      Reactions   Iodine Other (See Comments)   Shortness of breath, swelling and hives   Shrimp [shellfish Allergy] Other (See Comments)  SWELLING    HIVES    SHORTNESS OF BREATH   Tetracycline Rash      Medication List    TAKE these medications   acetaminophen 325 MG tablet Commonly known as: TYLENOL Take 1-2 tablets (325-650 mg total) by mouth every 4 (four) hours as needed for  mild pain.   Albuterol Sulfate 108 (90 Base) MCG/ACT Aepb Inhale 1 puff into the lungs every 6 (six) hours as needed (shortness of breath).   ascorbic acid 500 MG tablet Commonly known as: VITAMIN C Take 1 tablet (500 mg total) by mouth 2 (two) times daily.   aspirin 81 MG tablet Take 81 mg by mouth daily.   atorvastatin 80 MG tablet Commonly known as: LIPITOR TAKE 1 TABLET BY MOUTH EVERY DAY   balsalazide 750 MG capsule Commonly known as: COLAZAL TAKE 1 CAPSULE (750 MG TOTAL) BY MOUTH 3 (THREE) TIMES DAILY. What changed:   how much to take  how to take this  when to take this  additional instructions   DECUBI-VITE PO Take by mouth daily.   digoxin 0.125 MG tablet Commonly known as: LANOXIN Take 1 tablet (0.125 mg total) by mouth daily.   feeding supplement (NEPRO CARB STEADY) Liqd Take 237 mLs by mouth 2 (two) times daily between meals.   finasteride 5 MG tablet Commonly known as: PROSCAR Take 1 tablet (5 mg total) by mouth daily.   HYDROcodone-acetaminophen 5-325 MG tablet Commonly known as: NORCO/VICODIN Take 1 tablet by mouth every 6 (six) hours as needed for severe pain.   insulin aspart 100 UNIT/ML injection Commonly known as: novoLOG Inject 5 Units into the skin 3 (three) times daily with meals. What changed: when to take this   lactobacillus Pack Take 1 packet (1 g total) by mouth 3 (three) times daily with meals.   metoprolol succinate 25 MG 24 hr tablet Commonly known as: TOPROL-XL Take 1 tablet (25 mg total) by mouth daily.   midodrine 10 MG tablet Commonly known as: PROAMATINE Take 1 tablet (10 mg total) by mouth 3 (three) times daily.   niacin 1000 MG CR tablet Commonly known as: NIASPAN Take 1,000 mg by mouth daily.   omeprazole 20 MG capsule Commonly known as: PRILOSEC Take 20 mg by mouth daily.   piperacillin-tazobactam 3.375 GM/50ML IVPB Commonly known as: ZOSYN Inject 50 mLs (3.375 g total) into the vein every 6 (six) hours  for 15 days.   potassium chloride 10 MEQ tablet Commonly known as: KLOR-CON Take 1 tablet (10 mEq total) by mouth daily.   tamsulosin 0.4 MG Caps capsule Commonly known as: FLOMAX Take 1 capsule (0.4 mg total) by mouth daily after breakfast.   torsemide 20 MG tablet Commonly known as: DEMADEX Take 2 tablets (40 mg) by mouth once daily   Xarelto 20 MG Tabs tablet Generic drug: rivaroxaban TAKE 1 TABLET BY MOUTH EVERY DAY WITH LUNCH        Follow-up Information    [name], MD Follow up.   Specialty: Family Medicine Why: as needed Contact information: [place] [address] [phone]        Minna Merritts, MD .   Specialty: Cardiology Contact information: [place] [address] [phone]        Ronny Bacon, MD. Schedule an appointment as soon as possible for a visit in 2 week(s).   Specialty: Surgery Why: colostomy follow up as needed Contact information: [place]  [address] [phone]            Time spent on discharge management including discussion of hospital course, clinical condition, outpatient instructions, prescriptions, and follow up with the patient and members of the medical team: >30 minutes  -- Edison Simon , PA-C [facility]  07/18/2019, 9:07 AM [phone] M-F: 7am - 4pm

## 2019-07-18 NOTE — Care Management Important Message (Signed)
Important Message  Patient Details  Name: Colton Lamb MRN: [id number] Date of Birth: 08-04-1950   Medicare Important Message Given:  Yes     Dannette Barbara 07/18/2019, 1:14 PM

## 2019-07-29 ENCOUNTER — Encounter: Payer: Self-pay | Admitting: Family

## 2019-07-29 ENCOUNTER — Telehealth: Payer: Self-pay

## 2019-07-29 ENCOUNTER — Ambulatory Visit: Payer: Medicare Other | Admitting: Family

## 2019-07-29 NOTE — Progress Notes (Deleted)
Office Visit    Patient Name: Colton Lamb Date of Encounter: 07/29/2019  Primary Care Provider:  [name], MD Primary Cardiologist:  Ida Rogue, MD Electrophysiologist:  None   Chief Complaint    Colton Lamb is a 68 y.o. male with a hx of *** presents today for follow-up after hospitalization.  Past Medical History    Past Medical History:  Diagnosis Date  . Atrial flutter ([place])    a. s/p Cardioversion 11/22/13, on amiodarone and Xarelto.  . CHF (congestive heart failure) ([place])   . Chronic osteomyelitis ([place]) 06/30/2019  . Chronic systolic heart failure ([place])    a. 10/2013 EF 20-25%, grade III DD, RV mildly dilated and sys fx mild/mod reduced;  b. 01/2014 Echo: EF 30-35%, gr3 DD, mod dil LA.  Marland Kitchen Coronary artery disease    a. s/p MI 2007/2015;  b. s/p prior PCI to the LAD/LCX/PDA/PL;  c. 2008: s/p Cypher DES to the OM.  Marland Kitchen Crohn's ileocolitis ([place])   . GERD (gastroesophageal reflux disease)   . Hx of adenomatous colonic polyps 11/2003  . Hyperlipidemia   . Hypertension   . Ischemic cardiomyopathy    s. 01/2014 s/p MDT [id number] [name] XT DR single lead AICD.  Marland Kitchen Obesity   . Paroxysmal atrial fibrillation ([facility])    a. CHA2DS2VASc = 4-->xarelto/amio.  . Sleep apnea   . Syncope    a.  11/2013 in setting of volume depletion and bradycardia due to dig toxicity   . Type II diabetes mellitus ([place])    Past Surgical History:  Procedure Laterality Date  . ATRIAL FLUTTER ABLATION N/A 04/16/2014   Procedure: ATRIAL FLUTTER ABLATION;  Surgeon: Evans Lance, MD;  Location[facility] CATH LAB;  Service: Cardiovascular;  Laterality: N/A;  . CARDIAC CATHETERIZATION  10/2013  . CARDIAC DEFIBRILLATOR PLACEMENT  04/16/2014   Medtronic Evira device  . CARDIAC ELECTROPHYSIOLOGY STUDY AND ABLATION  04/16/2014   atrial flutter ablation  . CARDIOVERSION N/A 03/05/2014   Procedure: CARDIOVERSION;  Surgeon: Jolaine Artist, MD;  Location: [facility] ENDOSCOPY;  Service: Cardiovascular;   Laterality: N/A;  . CATARACT EXTRACTION W/PHACO Right 01/04/2017   Procedure: CATARACT EXTRACTION PHACO AND INTRAOCULAR LENS PLACEMENT ([place])  Right Diabetic Complicated;  Surgeon: Leandrew Koyanagi, MD;  Location: [place];  Service: Ophthalmology;  Laterality: Right;  Diabetic  . CATARACT EXTRACTION W/PHACO Left 02/08/2017   Procedure: CATARACT EXTRACTION PHACO AND INTRAOCULAR LENS PLACEMENT ([place]) left diabetic;  Surgeon: Leandrew Koyanagi, MD;  Location: [place];  Service: Ophthalmology;  Laterality: Left;  Diabetic - oral meds sleep apnea  . CORONARY ANGIOPLASTY WITH STENT PLACEMENT  2007; 2008 X 2   "1+1 ~ 1"  . FOOT SURGERY Left    bone spur  . HYDROCELE EXCISION Bilateral   . 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 IMPLANTABLE CARDIOVERTER DEFIBRILLATOR IMPLANT N/A 04/16/2014   Procedure: IMPLANTABLE CARDIOVERTER DEFIBRILLATOR IMPLANT;  Surgeon: Evans Lance, MD;  Location: [facility] CATH LAB;  Service: Cardiovascular;  Laterality: N/A;  . LEFT HEART CATHETERIZATION WITH CORONARY ANGIOGRAM N/A 11/22/2013   Procedure: LEFT HEART CATHETERIZATION WITH CORONARY ANGIOGRAM;  Surgeon: Sinclair Grooms, MD;  Location: [facility] CATH LAB;  Service: Cardiovascular;  Laterality: N/A;  . TRANSVERSE LOOP COLOSTOMY N/A 07/03/2019   Procedure: TRANSVERSE LOOP COLOSTOMY;  Surgeon: Ronny Bacon, MD;  Location: [facility] ORS;  Service: General;  Laterality: N/A;    Allergies  Allergies  Allergen Reactions  . Iodine Other (See  Comments)    Shortness of breath, swelling and hives  . Shrimp [Shellfish Allergy] Other (See Comments)    SWELLING    HIVES    SHORTNESS OF BREATH  . Tetracycline Rash    History of Present Illness    Colton Lamb is a 68 y.o. male with a hx of HFrEF (EF 35-40% --> 20-25%) with ischemic cardiomyopathy, ICD placement in 2015, CAD, atrial fibrillation/flutter on anticoagulation, DM2, OSA, dacral decubitus stage IV with  osteomyelitis, Chrohn's*** last seen ***.  In regard to atrial fib/fluuter, he had DCCV 2005 and ablation 2015. His CAD is s/p MI 2007 and 2015. Prior PCI to the LAD, LCx, PDA, and PL. In 2008 he had DES to OM.   Echo 06/25/2019 with LVEF 20 to 25%, mild LVH, severely dilated LV cavity size, small pericardial effusion.  Admitted 06/12/19-07/09/19. Underwent diverting loop colostomy 07/03/2019 for stool diversion given stage VI sacral ulceration.  He was discharged 07/09/2019.  Represented to the hospital 07/11/2019 with bleeding in his ostomy exacerbated by Xarelto use.  Admitted for general observation was subsequently discharged 07/18/2019.  EKGs/Labs/Other Studies Reviewed:   The following studies were reviewed today: *** Echo  06/25/2019  1. Left ventricular ejection fraction, by visual estimation, is 20 to 25%. The left ventricle has severely decreased function. There is mildly increased left ventricular hypertrophy.  2. Severely dilated left ventricular internal cavity size.  3. There is global hypokinesis with thinning/akinesis of the anterior and septal walls.  4. Global right ventricle was not well visualized.The right ventricular size is not assessed. Right vetricular wall thickness was not assessed.  5. Left atrial size was not assessed.  6. Right atrial size was not assessed.  7. Small pericardial effusion.  8. The pericardial effusion is circumferential.  9. Mild mitral annular calcification. 10. The mitral valve was not assessed. No assessment of mitral valve regurgitation. 11. The tricuspid valve is not assessed. Tricuspid valve regurgitation was not assessed. 12. Aortic valve regurgitation was not assessed. 13. The aortic valve was not assessed. Aortic valve regurgitation was not assessed. 14. The pulmonic valve was not well visualized. Pulmonic valve regurgitation was not assessed. 15. Aortic root could not be assessed. 16. The interatrial septum was not assessed.    Echo 02/08/2019  1. The left ventricle has moderately reduced systolic function, with an ejection fraction of 35-40%. The cavity size was normal. Left ventricular diastolic Doppler parameters are indeterminate. Probably severe hypokinesis of mid-distal anterior,  ateroseptal and apical myocardium.  2. The right ventricle has normal systolic function. The cavity was normal. There is no increase in right ventricular wall thickness.  3. Left atrial size was moderately dilated.  4. Very poor image quality in spite of using Definity. Valves not well visualized. Consider different diagnostic testing if indicated.  5. The aortic valve was not well visualized. Aortic valve regurgitation was not assessed by color flow Doppler.   EKG:  EKG is *** ordered today.  The ekg ordered today demonstrates ***  Recent Labs: 06/12/2019: B Natriuretic Peptide 731.0 07/09/2019: Magnesium 1.9 07/11/2019: ALT 12; BUN 13; Creatinine, Ser 0.74; Potassium 3.4; Sodium 141 07/17/2019: Hemoglobin 7.8; Platelets 247  Recent Lipid Panel    Component Value Date/Time   CHOL 112 05/09/2016 0830   TRIG 100 05/09/2016 0830   HDL 35 (L) 05/09/2016 0830   CHOLHDL 3.2 11/23/2013 0200   VLDL 11 11/23/2013 0200   LDLCALC 57 05/09/2016 0830    Home Medications   No outpatient medications  have been marked as taking for the 07/29/19 encounter (Appointment) with Loel Dubonnet, NP.      Review of Systems    ***   ROS All other systems reviewed and are otherwise negative except as noted above.  Physical Exam    VS:  There were no vitals taken for this visit. , BMI There is no height or weight on file to calculate BMI. GEN: Well nourished, well developed, in no acute distress. HEENT: normal. Neck: Supple, no JVD, carotid bruits, or masses. Cardiac: ***RRR, no murmurs, rubs, or gallops. No clubbing, cyanosis, edema.  ***Radials/DP/PT 2+ and equal bilaterally.  Respiratory:  ***Respirations regular and unlabored,  clear to auscultation bilaterally. GI: Soft, nontender, nondistended, BS + x 4. MS: No deformity or atrophy. Skin: Warm and dry, no rash. Neuro:  Strength and sensation are intact. Psych: Normal affect.  Accessory Clinical Findings    ECG personally reviewed by me today - *** - no acute changes.  Assessment & Plan    1. HFrEF - *** Echo 07/2019 with EF reduced from 35-40% to 20-25%. ***Would benefit from further ischemic work  p of reduced EF once healed from ulcer/recovered from colostomy procedure.  2. Persistent atrial fibrillation -  3. CAD -  4. Hypotension -  5. HLD - LDL goal <70. 6. DM2 -  7. Sacral decubitus ulcer and osteomyelitis - Following with surgery.   Disposition: Follow up {follow up:[id number]} with Dr. Rockey Situ or APP.   Loel Dubonnet, NP 07/29/2019, 8:00 AM

## 2019-07-29 NOTE — Telephone Encounter (Signed)
Patient snf unaware of appt today.  Called to [employer] .  Spoke with Linna Hoff RN to reschedule.

## 2019-07-30 ENCOUNTER — Ambulatory Visit: Payer: Medicare Other | Admitting: Family

## 2019-08-02 ENCOUNTER — Encounter: Payer: Self-pay | Admitting: Physician Assistant

## 2019-08-02 NOTE — Progress Notes (Deleted)
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  Past Medical History    Past Medical History:  Diagnosis Date  . Atrial flutter ([place])    a. s/p Cardioversion 11/22/13, on amiodarone and Xarelto.  . CHF (congestive heart failure) ([place])   . Chronic osteomyelitis ([place]) 06/30/2019  . Chronic systolic heart failure ([place])    a. 10/2013 EF 20-25%, grade III DD, RV mildly dilated and sys fx mild/mod reduced;  b. 01/2014 Echo: EF 30-35%, gr3 DD, mod dil LA. c. 07/2019 EF 20-25%  . Coronary artery disease    a. s/p MI 2007/2015;  b. s/p prior PCI to the LAD/LCX/PDA/PL;  c. 2008: s/p Cypher DES to the OM.  Marland Kitchen Crohn's ileocolitis ([place])   . GERD (gastroesophageal reflux disease)   . Hx of adenomatous colonic polyps 11/2003  . Hyperlipidemia   . Hypertension   . Hypotension   . Ischemic cardiomyopathy    s. 01/2014 s/p MDT [id number] [name] XT DR single lead AICD.  Marland Kitchen Obesity   . Paroxysmal atrial fibrillation (HCC)    a. CHA2DS2VASc = 4-->xarelto/amio.  . Sleep apnea   . Syncope    a.  11/2013 in setting of volume depletion and bradycardia  due to dig toxicity   . Type II diabetes mellitus ([place])    Past Surgical History:  Procedure Laterality Date  . ATRIAL FLUTTER ABLATION N/A 04/16/2014   Procedure: ATRIAL FLUTTER ABLATION;  Surgeon: Evans Lance, MD;  Location: [facility] CATH LAB;  Service: Cardiovascular;  Laterality: N/A;  . CARDIAC CATHETERIZATION  10/2013  . CARDIAC DEFIBRILLATOR PLACEMENT  04/16/2014   Medtronic Evira device  . CARDIAC ELECTROPHYSIOLOGY STUDY AND ABLATION  04/16/2014   atrial flutter ablation  . CARDIOVERSION N/A 03/05/2014   Procedure: CARDIOVERSION;  Surgeon: Jolaine Artist, MD;  Location: [facility] ENDOSCOPY;  Service: Cardiovascular;  Laterality: N/A;  . CATARACT EXTRACTION W/PHACO Right 01/04/2017   Procedure: CATARACT EXTRACTION PHACO AND INTRAOCULAR LENS PLACEMENT ([place])  Right Diabetic Complicated;  Surgeon: Leandrew Koyanagi, MD;  Location: [place];  Service: Ophthalmology;  Laterality: Right;  Diabetic  . CATARACT EXTRACTION W/PHACO Left 02/08/2017   Procedure: CATARACT EXTRACTION PHACO AND INTRAOCULAR LENS PLACEMENT ([place]) left diabetic;  Surgeon: Leandrew Koyanagi, MD;  Location: [place];  Service: Ophthalmology;  Laterality: Left;  Diabetic - oral meds sleep apnea  . CORONARY ANGIOPLASTY WITH STENT PLACEMENT  2007; 2008 X 2   "1+1 ~ 1"  . FOOT SURGERY Left    bone spur  . HYDROCELE EXCISION Bilateral   . 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 IMPLANTABLE CARDIOVERTER DEFIBRILLATOR IMPLANT N/A 04/16/2014   Procedure: IMPLANTABLE  CARDIOVERTER DEFIBRILLATOR IMPLANT;  Surgeon: Evans Lance, MD;  Location[name] C[facility] LAB;  Service: Cardiovascular;  Laterality: N/A;  . LEFT HEART CATHETERIZATION WITH CORONARY ANGIOGRAM N/A 11/22/2013   Procedure: LEFT HEART CATHETERIZATION WITH CORONARY ANGIOGRAM;  Surgeon: Sinclair Grooms, MD;  Location: [facility] CATH LAB;  Service: Cardiovascular;  Laterality: N/A;  . TRANSVERSE LOOP COLOSTOMY N/A 07/03/2019   Procedure:  TRANSVERSE LOOP COLOSTOMY;  Surgeon: Ronny Bacon, MD;  Location: [facility] ORS;  Service: General;  Laterality: N/A;    Allergies  Allergies  Allergen Reactions  . Iodine Other (See Comments)    Shortness of breath, swelling and hives  . Shrimp [Shellfish Allergy] Other (See Comments)    SWELLING    HIVES    SHORTNESS OF BREATH  . Tetracycline Rash    History of Present Illness    69 yo male with PMH as above and recent prolonged hospitalization. 2015 admission with NSTEMI and new onset atrial flutter. LHC showed patent LM, pLAD totally ocluded within stent, LCx patent, co-dominant RCA, dRCA leading to PDA contained segmental 70% stenosis, LVE with EF ~20% with possible LV thrombus and medical management advised. Echo showed EF 20-25%, diffuse HK, G3DD, mild RVE with mild to moderate reduced RVSF.   He reportedly spent most of 03/2019 in the hospital with sepsis, pleural effusion, renal failure, and empyema. He was seen in the office at follow-up with recommendation for torsemide 36m daily. It was noted he had intermittent delirium at his nursing facility. Escalation of heart failure therapy was limited by his hypotension.   [facility] and noted to be on 6L [place] when he arrived in the ED. Updated echo showed EF newly reduced 35 to 40%   20 to 25%. He was found to have sacral decubitus ulcer and chronic osteomyelitis with fecal contamination. He was placed on wound vac and ultimately underwent diverting colostomy 125/4complicated by reintubation postoperatively secondary to hypoxia.It was noted he would benefit from future cardiac cath and weaning off of midodrine as tolerated to minimize afterload in the setting of severely reduced LVEF.  His Xarelto was restarted per surgery before discharge. [facility] found to have blood in his colostomy bag. It was noted that his sacral ulcer was still managed with wound vac. [facility] until bleeding [facility]. It was noted that the bleeding was though exacerbated by the Xarelto.  Home Medications    Prior to Admission medications   Medication Sig Start Date End Date Taking? Authorizing Provider  acetaminophen (TYLENOL) 325 MG tablet Take 1-2 tablets (325-650 mg total) by mouth every 4 (four) hours as needed for mild pain. 04/17/14   IIsaiah Serge NP  Albuterol Sulfate 108 (90 Base) MCG/ACT AEPB Inhale 1 puff into the lungs every 6 (six) hours as needed (shortness of breath).     [provider]  aspirin 81 MG tablet Take 81 mg by mouth daily.     [provider]  atorvastatin (LIPITOR) 80 MG tablet TAKE 1 TABLET BY MOUTH EVERY DAY Patient taking differently: Take 80 mg by mouth daily.  03/08/19   GMinna Merritts MD  balsalazide (COLAZAL) 750 MG capsule TAKE 1 CAPSULE (750 MG TOTAL) BY MOUTH 3 (THREE) TIMES DAILY. Patient taking differently: Take 750 mg by mouth 3 (three) times daily.  11/28/18   SLucio Edward  T, MD  digoxin (LANOXIN) 0.125 MG tablet Take 1 tablet (0.125 mg total) by mouth daily. 07/10/19   [name], MD  finasteride (PROSCAR) 5 MG tablet Take 1 tablet (5 mg total) by mouth daily. 03/20/19   Loletha Grayer, MD  HYDROcodone-acetaminophen (NORCO/VICODIN) 5-325 MG tablet Take 1 tablet by mouth every 6 (six) hours as needed for severe pain. 07/09/19   [name], MD  insulin aspart (NOVOLOG) 100 UNIT/ML injection Inject 5 Units into the skin 3 (three) times daily with meals. Patient taking differently: Inject 5 Units into the skin 3 (three) times daily before meals.  03/19/19   Loletha Grayer, MD  lactobacillus (FLORANEX/LACTINEX) PACK Take 1 packet (1 g total) by mouth 3 (three) times daily with meals. 03/19/19   Loletha Grayer, MD  metoprolol succinate (TOPROL-XL) 25 MG 24 hr tablet Take 1 tablet (25 mg total) by mouth daily. 07/10/19   [name],  MD  midodrine (PROAMATINE) 10 MG tablet Take 1 tablet (10 mg total) by mouth 3 (three) times daily. 04/26/19   Max Sane, MD  Multiple Vitamins-Minerals (DECUBI-VITE PO) Take by mouth daily.    [provider]  niacin (NIASPAN) 1000 MG CR tablet Take 1,000 mg by mouth daily. 04/10/19   [provider]  Nutritional Supplements (FEEDING SUPPLEMENT, NEPRO CARB STEADY,) LIQD Take 237 mLs by mouth 2 (two) times daily between meals. 03/19/19   Loletha Grayer, MD  omeprazole (PRILOSEC) 20 MG capsule Take 20 mg by mouth daily.     [provider]  potassium chloride (KLOR-CON) 10 MEQ tablet Take 1 tablet (10 mEq total) by mouth daily. 05/08/19 08/06/19  Minna Merritts, MD  tamsulosin (FLOMAX) 0.4 MG CAPS capsule Take 1 capsule (0.4 mg total) by mouth daily after breakfast. 03/20/19   Loletha Grayer, MD  torsemide (DEMADEX) 20 MG tablet Take 2 tablets (40 mg) by mouth once daily 05/08/19   Minna Merritts, MD  vitamin C (VITAMIN C) 500 MG tablet Take 1 tablet (500 mg total) by mouth 2 (two) times daily. 07/09/19   [name], MD  XARELTO 20 MG TABS tablet TAKE 1 TABLET BY MOUTH EVERY DAY WITH LUNCH 05/17/19   Minna Merritts, MD    Review of Systems    ***.  All other systems reviewed and are otherwise negative except as noted above.  Physical Exam    VS:  There were no vitals taken for this visit. , BMI There is no height or weight on file to calculate BMI. GEN: Well nourished, well developed, in no acute distress. HEENT: normal. Neck: Supple, no JVD, carotid bruits, or masses. Cardiac: RRR, no murmurs, rubs, or gallops. No clubbing, cyanosis, edema.  Radials/DP/PT 2+ and equal bilaterally.  Respiratory:  Respirations regular and unlabored, clear to auscultation bilaterally. GI: Soft, nontender, nondistended, BS + x 4. MS: no deformity or atrophy. Skin: warm and dry, no rash. Neuro:  Strength and sensation are intact. Psych: Normal affect.  Accessory Clinical  Findings    ECG personally reviewed by me today - *** - no acute changes.  There were no vitals filed for this visit.   Echo 06/25/19 1. Left ventricular ejection fraction, by visual estimation, is 20 to 25%. The left ventricle has severely decreased function. There is mildly increased left ventricular hypertrophy.  2. Severely dilated left ventricular internal cavity size.  3. There is global hypokinesis with thinning/akinesis of the anterior and septal walls.  4. Global right ventricle was not well visualized.The right  ventricular size is not assessed. Right vetricular wall thickness was not assessed.  5. Left atrial size was not assessed.  6. Right atrial size was not assessed.  7. Small pericardial effusion.  8. The pericardial effusion is circumferential.  9. Mild mitral annular calcification. 10. The mitral valve was not assessed. No assessment of mitral valve regurgitation. 11. The tricuspid valve is not assessed. Tricuspid valve regurgitation was not assessed. 12. Aortic valve regurgitation was not assessed. 13. The aortic valve was not assessed. Aortic valve regurgitation was not assessed. 14. The pulmonic valve was not well visualized. Pulmonic valve regurgitation was not assessed. 15. Aortic root could not be assessed. 16. The interatrial septum was not assessed.  12/16 CBC WBC 10.0, hemoglobin 7.8, RBC 3.13, hematocrit 24.9 12/10 BMET sodium 141, potassium 3.4, creatinine 0.74, BUN 13, CO2 34, glucose 144, calcium 8.1, albumin 2.6 2017 total cholesterol 112, HDL 35, LDL 57, TG 100 2018 TSH 3.060 2020 A1C 5.1  Assessment & Plan    1.  ***   Arvil Chaco, PA-C 08/02/2019, 9:23 PM

## 2019-08-05 ENCOUNTER — Ambulatory Visit: Payer: Medicare Other | Admitting: Physician Assistant

## 2019-08-07 ENCOUNTER — Ambulatory Visit: Payer: Medicare Other | Admitting: Physician Assistant

## 2019-08-14 ENCOUNTER — Telehealth: Payer: Self-pay

## 2019-08-14 NOTE — Telephone Encounter (Signed)
Rcvd VM from [employer] @ [employer]. I was unable to reach her but LM and gave my cell incase I am out of office working remote. I also left office number. Will await call back.

## 2019-09-03 NOTE — Progress Notes (Deleted)
Patient ID: Colton Lamb, male    DOB: 01-Dec-1950, 69 y.o.   MRN: [id number]  HPI  Colton Lamb is a 69 y/o male with a history of CAD (MI), DM, hyperlipidemia, HTN, GERD, atrial fibrillation, atrial flutter, sleep apnea and chronic heart failure.   Echo report from 06/25/2019 reviewed and showed an EF of 20-25%. Echo report from 02/08/2019 reviewed and showed an EF of 35-40%.  Admitted 07/11/2019 due to GIB in ostomy. GI consult obtained. Xarelto was held short term and bleeding resolved. Discharged back to SNF after 7 days. Admitted 06/12/2019 due to acute on chronic heart failure along with stage IV sacral ulcer. Palliative care, ID, cardiology, wound care and surgical consults obtained. Initially needed bipap and then transitioned to 3L nasal cannula. Had diverting loop colostomy placed. Iv antibiotics given per PICC line. Discharged after 27 days. Was in the ED 05/18/2019 due to shortness of breath. Defibrillator was interrogated. IV lasix was given and he was released back to SNF. Was in the ED 04/30/2019 due to shortness of breath. COVID negative. Discharged with antibiotics back to SNF. Admitted 04/21/2019 due to septic shock and sacrum stage 4 pressure injury. Surgery, wound and palliative care consults obtained. Given IV antibiotics. Bedside debridement done of pressure ulcer. Discharged after 5 days.   He presents today for a follow-up visit with a chief complaint of   Past Medical History:  Diagnosis Date  . Atrial flutter ([place])    a. s/p Cardioversion 11/22/13, on amiodarone and Xarelto.  . CHF (congestive heart failure) ([place])   . Chronic osteomyelitis ([place]) 06/30/2019   s/p colostomy  . Chronic systolic heart failure ([place])    a. 10/2013 EF 20-25%, grade III DD, RV mildly dilated and sys fx mild/mod reduced;  b. 01/2014 Echo: EF 30-35%, gr3 DD, mod dil LA. c. 07/2019 EF 20-25%  . Coronary artery disease    a. s/p MI 2007/2015;  b. s/p prior PCI to the LAD/LCX/PDA/PL;  c. 2008: s/p  Cypher DES to the OM.  Marland Kitchen Crohn's ileocolitis ([place])   . GERD (gastroesophageal reflux disease)   . Hx of adenomatous colonic polyps 11/2003  . Hyperlipidemia   . Hypertension   . Hypotension   . Ischemic cardiomyopathy    s. 01/2014 s/p MDT DDBB1D1 Colton Lamb single lead AICD.  Marland Kitchen Obesity   . Paroxysmal atrial fibrillation (HCC)    a. CHA2DS2VASc = (CHF, HTN, agex1, DM)  . Sleep apnea   . Syncope    a.  11/2013 in setting of volume depletion and bradycardia due to dig toxicity   . Type II diabetes mellitus ([place])    Past Surgical History:  Procedure Laterality Date  . ATRIAL FLUTTER ABLATION N/A 04/16/2014   Procedure: ATRIAL FLUTTER ABLATION;  Surgeon: Evans Lance, MD;  Location: [facility] CATH LAB;  Service: Cardiovascular;  Laterality: N/A;  . CARDIAC CATHETERIZATION  10/2013  . CARDIAC DEFIBRILLATOR PLACEMENT  04/16/2014   Medtronic Evira device  . CARDIAC ELECTROPHYSIOLOGY STUDY AND ABLATION  04/16/2014   atrial flutter ablation  . CARDIOVERSION N/A 03/05/2014   Procedure: CARDIOVERSION;  Surgeon: Colton Artist, MD;  Location: [facility] ENDOSCOPY;  Service: Cardiovascular;  Laterality: N/A;  . CATARACT EXTRACTION W/PHACO Right 01/04/2017   Procedure: CATARACT EXTRACTION PHACO AND INTRAOCULAR LENS PLACEMENT ([place])  Right Diabetic Complicated;  Surgeon: Leandrew Koyanagi, MD;  Location: [place];  Service: Ophthalmology;  Laterality: Right;  Diabetic  . CATARACT EXTRACTION W/PHACO Left 02/08/2017   Procedure: CATARACT  EXTRACTION PHACO AND INTRAOCULAR LENS PLACEMENT ([place]) left diabetic;  Surgeon: Leandrew Koyanagi, MD;  Location: [place];  Service: Ophthalmology;  Laterality: Left;  Diabetic - oral meds sleep apnea  . CORONARY ANGIOPLASTY WITH STENT PLACEMENT  2007; 2008 X 2   "1+1 ~ 1"  . FOOT SURGERY Left    bone spur  . HYDROCELE EXCISION Bilateral   . 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 IMPLANTABLE CARDIOVERTER DEFIBRILLATOR  IMPLANT N/A 04/16/2014   Procedure: IMPLANTABLE CARDIOVERTER DEFIBRILLATOR IMPLANT;  Surgeon: Evans Lance, MD;  Location: [facility] CATH LAB;  Service: Cardiovascular;  Laterality: N/A;  . LEFT HEART CATHETERIZATION WITH CORONARY ANGIOGRAM N/A 11/22/2013   Procedure: LEFT HEART CATHETERIZATION WITH CORONARY ANGIOGRAM;  Surgeon: Sinclair Grooms, MD;  Location: [facility] CATH LAB;  Service: Cardiovascular;  Laterality: N/A;  . TRANSVERSE LOOP COLOSTOMY N/A 07/03/2019   Procedure: TRANSVERSE LOOP COLOSTOMY;  Surgeon: Ronny Bacon, MD;  Location: [facility] ORS;  Service: General;  Laterality: N/A;   Family History  Problem Relation Age of Onset  . Breast cancer Mother   . Heart disease Father   . Heart attack Father   . Colon cancer Neg Hx    Social History   Tobacco Use  . Smoking status: Never Smoker  . Smokeless tobacco: Never Used  Substance Use Topics  . Alcohol use: No   Allergies  Allergen Reactions  . Iodine Other (See Comments)    Shortness of breath, swelling and hives  . Shrimp [Shellfish Allergy] Other (See Comments)    SWELLING    HIVES    SHORTNESS OF BREATH  . Tetracycline Rash     Review of Systems  Constitutional: Positive for fatigue (minimal). Negative for appetite change.  HENT: Negative for congestion, postnasal drip and sore throat.   Eyes: Negative.   Respiratory: Positive for cough (little bit) and shortness of breath (at times).   Cardiovascular: Negative for chest pain, palpitations and leg swelling.  Gastrointestinal: Negative for abdominal distention and abdominal pain.  Endocrine: Negative.   Genitourinary:       Has foley catheter present  Musculoskeletal: Negative for back pain and neck pain.  Allergic/Immunologic: Negative.   Neurological: Negative for dizziness and light-headedness.  Hematological: Negative for adenopathy. Does not bruise/bleed easily.  Psychiatric/Behavioral: Negative for dysphoric mood and sleep disturbance. The patient is not  nervous/anxious.       Physical Exam Vitals and nursing note reviewed.  Constitutional:      Appearance: Normal appearance.     Comments: Does fall asleep easily  HENT:     Head: Normocephalic and atraumatic.  Cardiovascular:     Rate and Rhythm: Regular rhythm. Tachycardia present.  Pulmonary:     Effort: Pulmonary effort is normal. No respiratory distress.     Breath sounds: No wheezing or rales.  Abdominal:     General: There is no distension.     Palpations: Abdomen is soft.  Musculoskeletal:        General: No tenderness.     Cervical back: Normal range of motion and neck supple.     Right lower leg: Edema (2+ pitting) present.     Left lower leg: Edema (2+ pitting) present.  Skin:    General: Skin is warm and dry.  Neurological:     Mental Status: He is alert and oriented to person, place, and time.     Motor: Weakness (in legs) present.  Psychiatric:  Mood and Affect: Mood normal.        Behavior: Behavior normal.     Assessment & Plan:  1. Chronic heart failure with reduced ejection fraction- - NYHA class II - euvolemic today - being weighed daily with a hoyer lift due to weakness; reminded to call for an overnight weight gain of >2 pounds or a weekly weight gain of >5 pounds - not adding salt to his food - drinking water during the day - had telemedicine visit with cardiology Colton Lamb) 06/10/2019 - BP too low for entresto - may benefit from [name] - BNP 06/12/2019 was 731.0 - has ICD present - palliative care consult done 05/20/2019  2: HTN- - BP  - seeing PCP at the facility right now - BMP from 07/11/2019 reviewed and showed sodium 141, potassium 3.4, creatinine 0.74 and GFR >60  3: DM-  - A1c 03/05/2019 was 6.8% - went to the wound clinic 05/31/2019  4: Lymphedema- - stage 2 - usually in the bed with legs elevated - order written for compression socks to be applied in the mornings with removal at bedtime - patient says that PT has been  working with him but he's not sure if it's on a daily basis or not  Facility medication list was reviewed.

## 2019-09-04 ENCOUNTER — Ambulatory Visit: Payer: Medicare Other | Admitting: Family

## 2019-09-23 ENCOUNTER — Other Ambulatory Visit: Payer: Self-pay

## 2019-09-23 ENCOUNTER — Emergency Department: Payer: Medicare Other

## 2019-09-23 ENCOUNTER — Inpatient Hospital Stay
Admission: EM | Admit: 2019-09-23 | Discharge: 2019-10-02 | DRG: 853 | Disposition: A | Discharge status: Home Health | Payer: Medicare Other | Source: Skilled Nursing Facility | Attending: Hospitalist | Admitting: Hospitalist

## 2019-09-23 DIAGNOSIS — R7881 Bacteremia: Secondary | ICD-10-CM | POA: Diagnosis not present

## 2019-09-23 DIAGNOSIS — Z95810 Presence of automatic (implantable) cardiac defibrillator: Secondary | ICD-10-CM

## 2019-09-23 DIAGNOSIS — K5080 Crohn's disease of both small and large intestine without complications: Secondary | ICD-10-CM | POA: Diagnosis present

## 2019-09-23 DIAGNOSIS — L89312 Pressure ulcer of right buttock, stage 2: Secondary | ICD-10-CM | POA: Diagnosis present

## 2019-09-23 DIAGNOSIS — M4628 Osteomyelitis of vertebra, sacral and sacrococcygeal region: Secondary | ICD-10-CM | POA: Diagnosis not present

## 2019-09-23 DIAGNOSIS — Z95828 Presence of other vascular implants and grafts: Secondary | ICD-10-CM

## 2019-09-23 DIAGNOSIS — E785 Hyperlipidemia, unspecified: Secondary | ICD-10-CM | POA: Diagnosis present

## 2019-09-23 DIAGNOSIS — R64 Cachexia: Secondary | ICD-10-CM | POA: Diagnosis present

## 2019-09-23 DIAGNOSIS — Z9861 Coronary angioplasty status: Secondary | ICD-10-CM | POA: Diagnosis not present

## 2019-09-23 DIAGNOSIS — Z7189 Other specified counseling: Secondary | ICD-10-CM

## 2019-09-23 DIAGNOSIS — I2510 Atherosclerotic heart disease of native coronary artery without angina pectoris: Secondary | ICD-10-CM | POA: Diagnosis present

## 2019-09-23 DIAGNOSIS — Z7901 Long term (current) use of anticoagulants: Secondary | ICD-10-CM

## 2019-09-23 DIAGNOSIS — I4891 Unspecified atrial fibrillation: Secondary | ICD-10-CM | POA: Diagnosis present

## 2019-09-23 DIAGNOSIS — I5022 Chronic systolic (congestive) heart failure: Secondary | ICD-10-CM | POA: Diagnosis not present

## 2019-09-23 DIAGNOSIS — Z0189 Encounter for other specified special examinations: Secondary | ICD-10-CM

## 2019-09-23 DIAGNOSIS — E782 Mixed hyperlipidemia: Secondary | ICD-10-CM

## 2019-09-23 DIAGNOSIS — I480 Paroxysmal atrial fibrillation: Secondary | ICD-10-CM | POA: Diagnosis not present

## 2019-09-23 DIAGNOSIS — I483 Typical atrial flutter: Secondary | ICD-10-CM | POA: Diagnosis present

## 2019-09-23 DIAGNOSIS — E119 Type 2 diabetes mellitus without complications: Secondary | ICD-10-CM

## 2019-09-23 DIAGNOSIS — D849 Immunodeficiency, unspecified: Secondary | ICD-10-CM | POA: Diagnosis not present

## 2019-09-23 DIAGNOSIS — K219 Gastro-esophageal reflux disease without esophagitis: Secondary | ICD-10-CM | POA: Diagnosis present

## 2019-09-23 DIAGNOSIS — Z955 Presence of coronary angioplasty implant and graft: Secondary | ICD-10-CM

## 2019-09-23 DIAGNOSIS — L89150 Pressure ulcer of sacral region, unstageable: Secondary | ICD-10-CM | POA: Diagnosis present

## 2019-09-23 DIAGNOSIS — Z789 Other specified health status: Secondary | ICD-10-CM

## 2019-09-23 DIAGNOSIS — I252 Old myocardial infarction: Secondary | ICD-10-CM | POA: Diagnosis not present

## 2019-09-23 DIAGNOSIS — Z515 Encounter for palliative care: Secondary | ICD-10-CM | POA: Diagnosis not present

## 2019-09-23 DIAGNOSIS — I110 Hypertensive heart disease with heart failure: Secondary | ICD-10-CM | POA: Diagnosis present

## 2019-09-23 DIAGNOSIS — Z7401 Bed confinement status: Secondary | ICD-10-CM

## 2019-09-23 DIAGNOSIS — I420 Dilated cardiomyopathy: Secondary | ICD-10-CM | POA: Diagnosis present

## 2019-09-23 DIAGNOSIS — L89154 Pressure ulcer of sacral region, stage 4: Secondary | ICD-10-CM | POA: Diagnosis present

## 2019-09-23 DIAGNOSIS — U071 COVID-19: Secondary | ICD-10-CM | POA: Diagnosis present

## 2019-09-23 DIAGNOSIS — E1169 Type 2 diabetes mellitus with other specified complication: Secondary | ICD-10-CM | POA: Diagnosis present

## 2019-09-23 DIAGNOSIS — J80 Acute respiratory distress syndrome: Secondary | ICD-10-CM

## 2019-09-23 DIAGNOSIS — Z86010 Personal history of colonic polyps: Secondary | ICD-10-CM

## 2019-09-23 DIAGNOSIS — G4733 Obstructive sleep apnea (adult) (pediatric): Secondary | ICD-10-CM | POA: Diagnosis present

## 2019-09-23 DIAGNOSIS — R52 Pain, unspecified: Secondary | ICD-10-CM

## 2019-09-23 DIAGNOSIS — E871 Hypo-osmolality and hyponatremia: Secondary | ICD-10-CM | POA: Diagnosis not present

## 2019-09-23 DIAGNOSIS — L89892 Pressure ulcer of other site, stage 2: Secondary | ICD-10-CM | POA: Diagnosis present

## 2019-09-23 DIAGNOSIS — Z794 Long term (current) use of insulin: Secondary | ICD-10-CM

## 2019-09-23 DIAGNOSIS — N179 Acute kidney failure, unspecified: Secondary | ICD-10-CM | POA: Diagnosis present

## 2019-09-23 DIAGNOSIS — Z0279 Encounter for issue of other medical certificate: Secondary | ICD-10-CM

## 2019-09-23 DIAGNOSIS — J189 Pneumonia, unspecified organism: Secondary | ICD-10-CM | POA: Diagnosis present

## 2019-09-23 DIAGNOSIS — B965 Pseudomonas (aeruginosa) (mallei) (pseudomallei) as the cause of diseases classified elsewhere: Secondary | ICD-10-CM | POA: Diagnosis not present

## 2019-09-23 DIAGNOSIS — D649 Anemia, unspecified: Secondary | ICD-10-CM | POA: Diagnosis not present

## 2019-09-23 DIAGNOSIS — D638 Anemia in other chronic diseases classified elsewhere: Secondary | ICD-10-CM | POA: Diagnosis present

## 2019-09-23 DIAGNOSIS — G2581 Restless legs syndrome: Secondary | ICD-10-CM | POA: Diagnosis present

## 2019-09-23 DIAGNOSIS — L8990 Pressure ulcer of unspecified site, unspecified stage: Secondary | ICD-10-CM | POA: Diagnosis present

## 2019-09-23 DIAGNOSIS — I255 Ischemic cardiomyopathy: Secondary | ICD-10-CM | POA: Diagnosis present

## 2019-09-23 DIAGNOSIS — E118 Type 2 diabetes mellitus with unspecified complications: Secondary | ICD-10-CM | POA: Diagnosis not present

## 2019-09-23 DIAGNOSIS — L89311 Pressure ulcer of right buttock, stage 1: Secondary | ICD-10-CM | POA: Diagnosis present

## 2019-09-23 DIAGNOSIS — E1165 Type 2 diabetes mellitus with hyperglycemia: Secondary | ICD-10-CM | POA: Diagnosis present

## 2019-09-23 DIAGNOSIS — E860 Dehydration: Secondary | ICD-10-CM | POA: Diagnosis present

## 2019-09-23 DIAGNOSIS — Z933 Colostomy status: Secondary | ICD-10-CM | POA: Diagnosis not present

## 2019-09-23 DIAGNOSIS — I5042 Chronic combined systolic (congestive) and diastolic (congestive) heart failure: Secondary | ICD-10-CM | POA: Diagnosis present

## 2019-09-23 DIAGNOSIS — M8660 Other chronic osteomyelitis, unspecified site: Secondary | ICD-10-CM | POA: Diagnosis not present

## 2019-09-23 DIAGNOSIS — A4152 Sepsis due to Pseudomonas: Secondary | ICD-10-CM | POA: Diagnosis present

## 2019-09-23 DIAGNOSIS — A419 Sepsis, unspecified organism: Secondary | ICD-10-CM | POA: Diagnosis present

## 2019-09-23 DIAGNOSIS — N201 Calculus of ureter: Secondary | ICD-10-CM | POA: Diagnosis present

## 2019-09-23 DIAGNOSIS — N400 Enlarged prostate without lower urinary tract symptoms: Secondary | ICD-10-CM | POA: Diagnosis present

## 2019-09-23 DIAGNOSIS — Z7982 Long term (current) use of aspirin: Secondary | ICD-10-CM

## 2019-09-23 DIAGNOSIS — L89891 Pressure ulcer of other site, stage 1: Secondary | ICD-10-CM | POA: Diagnosis not present

## 2019-09-23 DIAGNOSIS — L89111 Pressure ulcer of right upper back, stage 1: Secondary | ICD-10-CM | POA: Diagnosis present

## 2019-09-23 DIAGNOSIS — I959 Hypotension, unspecified: Secondary | ICD-10-CM | POA: Diagnosis not present

## 2019-09-23 DIAGNOSIS — E669 Obesity, unspecified: Secondary | ICD-10-CM | POA: Diagnosis present

## 2019-09-23 DIAGNOSIS — Z6833 Body mass index (BMI) 33.0-33.9, adult: Secondary | ICD-10-CM

## 2019-09-23 DIAGNOSIS — Z8249 Family history of ischemic heart disease and other diseases of the circulatory system: Secondary | ICD-10-CM

## 2019-09-23 DIAGNOSIS — Y95 Nosocomial condition: Secondary | ICD-10-CM | POA: Diagnosis present

## 2019-09-23 DIAGNOSIS — F39 Unspecified mood [affective] disorder: Secondary | ICD-10-CM | POA: Diagnosis present

## 2019-09-23 DIAGNOSIS — I4811 Longstanding persistent atrial fibrillation: Secondary | ICD-10-CM | POA: Diagnosis not present

## 2019-09-23 DIAGNOSIS — IMO0002 Reserved for concepts with insufficient information to code with codable children: Secondary | ICD-10-CM | POA: Diagnosis present

## 2019-09-23 DIAGNOSIS — Z79899 Other long term (current) drug therapy: Secondary | ICD-10-CM

## 2019-09-23 DIAGNOSIS — I482 Chronic atrial fibrillation, unspecified: Secondary | ICD-10-CM | POA: Diagnosis present

## 2019-09-23 DIAGNOSIS — I10 Essential (primary) hypertension: Secondary | ICD-10-CM | POA: Diagnosis present

## 2019-09-23 LAB — CBC WITH DIFFERENTIAL/PLATELET
Abs Immature Granulocytes: 0.59 10*3/uL — ABNORMAL HIGH (ref 0.00–0.07)
Basophils Absolute: 0.1 10*3/uL (ref 0.0–0.1)
Basophils Relative: 0 %
Eosinophils Absolute: 0.2 10*3/uL (ref 0.0–0.5)
Eosinophils Relative: 1 %
HCT: 35.2 % — ABNORMAL LOW (ref 39.0–52.0)
Hemoglobin: 10.9 g/dL — ABNORMAL LOW (ref 13.0–17.0)
Immature Granulocytes: 2 %
Lymphocytes Relative: 7 %
Lymphs Abs: 2.1 10*3/uL (ref 0.7–4.0)
MCH: 25.2 pg — ABNORMAL LOW (ref 26.0–34.0)
MCHC: 31 g/dL (ref 30.0–36.0)
MCV: 81.3 fL (ref 80.0–100.0)
Monocytes Absolute: 1 10*3/uL (ref 0.1–1.0)
Monocytes Relative: 3 %
Neutro Abs: 25.7 10*3/uL — ABNORMAL HIGH (ref 1.7–7.7)
Neutrophils Relative %: 87 %
Platelets: 333 10*3/uL (ref 150–400)
RBC: 4.33 MIL/uL (ref 4.22–5.81)
RDW: 18.9 % — ABNORMAL HIGH (ref 11.5–15.5)
Smear Review: NORMAL
WBC: 29.7 10*3/uL — ABNORMAL HIGH (ref 4.0–10.5)
nRBC: 0 % (ref 0.0–0.2)

## 2019-09-23 LAB — PHOSPHORUS: Phosphorus: 2.9 mg/dL (ref 2.5–4.6)

## 2019-09-23 LAB — COMPREHENSIVE METABOLIC PANEL
ALT: 10 U/L (ref 0–44)
AST: 24 U/L (ref 15–41)
Albumin: 2.5 g/dL — ABNORMAL LOW (ref 3.5–5.0)
Alkaline Phosphatase: 137 U/L — ABNORMAL HIGH (ref 38–126)
Anion gap: 17 — ABNORMAL HIGH (ref 5–15)
BUN: 50 mg/dL — ABNORMAL HIGH (ref 8–23)
CO2: 32 mmol/L (ref 22–32)
Calcium: 9 mg/dL (ref 8.9–10.3)
Chloride: 85 mmol/L — ABNORMAL LOW (ref 98–111)
Creatinine, Ser: 1.43 mg/dL — ABNORMAL HIGH (ref 0.61–1.24)
GFR calc Af Amer: 58 mL/min — ABNORMAL LOW (ref >60)
GFR calc non Af Amer: 50 mL/min — ABNORMAL LOW (ref >60)
Glucose, Bld: 172 mg/dL — ABNORMAL HIGH (ref 70–99)
Potassium: 3.5 mmol/L (ref 3.5–5.1)
Sodium: 134 mmol/L — ABNORMAL LOW (ref 135–145)
Total Bilirubin: 0.8 mg/dL (ref 0.3–1.2)
Total Protein: 7.3 g/dL (ref 6.5–8.1)

## 2019-09-23 LAB — URINALYSIS, COMPLETE (UACMP) WITH MICROSCOPIC
Bacteria, UA: NONE SEEN
Bilirubin Urine: NEGATIVE
Glucose, UA: NEGATIVE mg/dL
Hgb urine dipstick: NEGATIVE
Ketones, ur: NEGATIVE mg/dL
Nitrite: NEGATIVE
Protein, ur: NEGATIVE mg/dL
Specific Gravity, Urine: 1.013 (ref 1.005–1.030)
Squamous Epithelial / HPF: NONE SEEN (ref 0–5)
WBC, UA: >50 WBC/hpf — ABNORMAL HIGH (ref 0–5)
pH: 5 (ref 5.0–8.0)

## 2019-09-23 LAB — LACTIC ACID, PLASMA
Lactic Acid, Venous: 1.9 mmol/L (ref 0.5–1.9)
Lactic Acid, Venous: 2.3 mmol/L (ref 0.5–1.9)
Lactic Acid, Venous: 1.8 mmol/L (ref 0.5–1.9)
Lactic Acid, Venous: 1.4 mmol/L (ref 0.5–1.9)

## 2019-09-23 LAB — MAGNESIUM: Magnesium: 1.8 mg/dL (ref 1.7–2.4)

## 2019-09-23 MED ORDER — SERTRALINE HCL 50 MG PO TABS
25.0000 mg | ORAL_TABLET | Freq: Every day | ORAL | Status: DC
  Administered 2019-09-24 – 2019-10-02 (×9): 25 mg via ORAL
  Filled 2019-09-23 (×7): qty 1
  Filled 2019-09-23: qty 0.5
  Filled 2019-09-23: qty 1

## 2019-09-23 MED ORDER — ASPIRIN EC 81 MG PO TBEC
81.0000 mg | DELAYED_RELEASE_TABLET | Freq: Every day | ORAL | Status: DC
  Administered 2019-09-24 – 2019-10-02 (×9): 81 mg via ORAL
  Filled 2019-09-23 (×9): qty 1

## 2019-09-23 MED ORDER — TAMSULOSIN HCL 0.4 MG PO CAPS
0.4000 mg | ORAL_CAPSULE | Freq: Every day | ORAL | Status: DC
  Administered 2019-09-24 – 2019-10-02 (×9): 0.4 mg via ORAL
  Filled 2019-09-23 (×9): qty 1

## 2019-09-23 MED ORDER — PANTOPRAZOLE SODIUM 40 MG PO TBEC
40.0000 mg | DELAYED_RELEASE_TABLET | Freq: Every day | ORAL | Status: DC
  Administered 2019-09-24 – 2019-10-02 (×9): 40 mg via ORAL
  Filled 2019-09-23 (×9): qty 1

## 2019-09-23 MED ORDER — POTASSIUM CHLORIDE ER 10 MEQ PO TBCR: 10.0000 meq | EXTENDED_RELEASE_TABLET | Freq: Every evening | ORAL | Status: DC

## 2019-09-23 MED ORDER — SODIUM CHLORIDE 0.9 % IV SOLN
2.0000 g | Freq: Three times a day (TID) | INTRAVENOUS | Status: DC
  Administered 2019-09-24: 05:00:00 2 g via INTRAVENOUS
  Filled 2019-09-23 (×3): qty 2

## 2019-09-23 MED ORDER — ACETAMINOPHEN 650 MG RE SUPP: 650.0000 mg | Freq: Four times a day (QID) | RECTAL | Status: DC | PRN

## 2019-09-23 MED ORDER — METOPROLOL SUCCINATE ER 25 MG PO TB24
25.0000 mg | ORAL_TABLET | Freq: Every day | ORAL | Status: DC
  Filled 2019-09-23: qty 1

## 2019-09-23 MED ORDER — SODIUM CHLORIDE 0.9 % IV SOLN: INTRAVENOUS | Status: DC

## 2019-09-23 MED ORDER — INSULIN ASPART 100 UNIT/ML ~~LOC~~ SOLN
5.0000 [IU] | Freq: Three times a day (TID) | SUBCUTANEOUS | Status: DC
  Administered 2019-09-24 – 2019-10-01 (×20): 5 [IU] via SUBCUTANEOUS
  Filled 2019-09-23 (×20): qty 1

## 2019-09-23 MED ORDER — VANCOMYCIN HCL 1500 MG/300ML IV SOLN
1500.0000 mg | Freq: Once | INTRAVENOUS | Status: DC
  Filled 2019-09-23: qty 300

## 2019-09-23 MED ORDER — SODIUM CHLORIDE 0.9% FLUSH
3.0000 mL | Freq: Two times a day (BID) | INTRAVENOUS | Status: DC
  Administered 2019-09-23 – 2019-10-01 (×12): 3 mL via INTRAVENOUS

## 2019-09-23 MED ORDER — SODIUM CHLORIDE 0.9 % IV BOLUS
1000.0000 mL | Freq: Once | INTRAVENOUS | Status: AC
  Administered 2019-09-23: 1000 mL via INTRAVENOUS

## 2019-09-23 MED ORDER — SODIUM CHLORIDE 0.9 % IV SOLN
250.0000 mL | INTRAVENOUS | Status: DC | PRN
  Administered 2019-09-24 – 2019-10-02 (×5): 250 mL via INTRAVENOUS

## 2019-09-23 MED ORDER — VANCOMYCIN HCL 1250 MG/250ML IV SOLN
1250.0000 mg | INTRAVENOUS | Status: DC
  Filled 2019-09-23: qty 250

## 2019-09-23 MED ORDER — BISACODYL 5 MG PO TBEC: 5.0000 mg | DELAYED_RELEASE_TABLET | Freq: Every day | ORAL | Status: DC | PRN

## 2019-09-23 MED ORDER — FINASTERIDE 5 MG PO TABS
5.0000 mg | ORAL_TABLET | Freq: Every day | ORAL | Status: DC
  Administered 2019-09-24 – 2019-10-02 (×9): 5 mg via ORAL
  Filled 2019-09-23 (×9): qty 1

## 2019-09-23 MED ORDER — NIACIN ER (ANTIHYPERLIPIDEMIC) 500 MG PO TBCR
1000.0000 mg | EXTENDED_RELEASE_TABLET | Freq: Every day | ORAL | Status: DC
  Administered 2019-09-24 – 2019-10-01 (×9): 1000 mg via ORAL
  Filled 2019-09-23 (×10): qty 2

## 2019-09-23 MED ORDER — RIVAROXABAN 20 MG PO TABS
20.0000 mg | ORAL_TABLET | Freq: Every day | ORAL | Status: DC
  Administered 2019-09-24 – 2019-10-02 (×9): 20 mg via ORAL
  Filled 2019-09-23 (×9): qty 1

## 2019-09-23 MED ORDER — OCUVITE-LUTEIN PO CAPS
1.0000 | ORAL_CAPSULE | Freq: Every day | ORAL | Status: DC
  Administered 2019-09-24 – 2019-10-02 (×9): 1 via ORAL
  Filled 2019-09-23 (×9): qty 1

## 2019-09-23 MED ORDER — VANCOMYCIN HCL 500 MG/100ML IV SOLN
500.0000 mg | Freq: Once | INTRAVENOUS | Status: AC
  Administered 2019-09-24: 500 mg via INTRAVENOUS
  Filled 2019-09-23: qty 100

## 2019-09-23 MED ORDER — POLYETHYLENE GLYCOL 3350 17 G PO PACK: 17.0000 g | PACK | Freq: Every day | ORAL | Status: DC | PRN

## 2019-09-23 MED ORDER — SODIUM CHLORIDE 0.9% FLUSH: 3.0000 mL | INTRAVENOUS | Status: DC | PRN

## 2019-09-23 MED ORDER — SODIUM CHLORIDE 0.9 % IV SOLN
2.0000 g | Freq: Once | INTRAVENOUS | Status: AC
  Administered 2019-09-23: 2 g via INTRAVENOUS
  Filled 2019-09-23: qty 2

## 2019-09-23 MED ORDER — ATORVASTATIN CALCIUM 20 MG PO TABS
80.0000 mg | ORAL_TABLET | Freq: Every day | ORAL | Status: DC
  Administered 2019-09-24 – 2019-09-29 (×6): 80 mg via ORAL
  Filled 2019-09-23 (×6): qty 4

## 2019-09-23 MED ORDER — ONDANSETRON HCL 4 MG/2ML IJ SOLN
4.0000 mg | Freq: Four times a day (QID) | INTRAMUSCULAR | Status: DC | PRN
  Administered 2019-09-28: 10:00:00 4 mg via INTRAVENOUS
  Filled 2019-09-23: qty 2

## 2019-09-23 MED ORDER — MIDODRINE HCL 5 MG PO TABS
10.0000 mg | ORAL_TABLET | Freq: Three times a day (TID) | ORAL | Status: DC
  Administered 2019-09-24 – 2019-10-02 (×27): 10 mg via ORAL
  Filled 2019-09-23 (×28): qty 2

## 2019-09-23 MED ORDER — ALBUTEROL SULFATE (2.5 MG/3ML) 0.083% IN NEBU: 2.5000 mg | INHALATION_SOLUTION | Freq: Four times a day (QID) | RESPIRATORY_TRACT | Status: DC | PRN

## 2019-09-23 MED ORDER — COLLAGENASE 250 UNIT/GM EX OINT
1.0000 "application " | TOPICAL_OINTMENT | Freq: Every day | CUTANEOUS | Status: DC
  Administered 2019-09-24 – 2019-09-29 (×6): 1 via TOPICAL
  Filled 2019-09-23 (×2): qty 30

## 2019-09-23 MED ORDER — LACTATED RINGERS IV BOLUS
250.0000 mL | Freq: Once | INTRAVENOUS | Status: AC
  Administered 2019-09-23: 250 mL via INTRAVENOUS

## 2019-09-23 MED ORDER — BALSALAZIDE DISODIUM 750 MG PO CAPS
750.0000 mg | ORAL_CAPSULE | Freq: Three times a day (TID) | ORAL | Status: DC
  Administered 2019-09-24 – 2019-10-02 (×25): 750 mg via ORAL
  Filled 2019-09-23 (×27): qty 1

## 2019-09-23 MED ORDER — DIGOXIN 125 MCG PO TABS
0.1250 mg | ORAL_TABLET | Freq: Every day | ORAL | Status: DC
  Administered 2019-09-24 – 2019-10-02 (×9): 0.125 mg via ORAL
  Filled 2019-09-23 (×9): qty 1

## 2019-09-23 MED ORDER — VANCOMYCIN HCL IN DEXTROSE 1-5 GM/200ML-% IV SOLN
1000.0000 mg | Freq: Once | INTRAVENOUS | Status: AC
  Administered 2019-09-23: 1000 mg via INTRAVENOUS
  Filled 2019-09-23: qty 200

## 2019-09-23 MED ORDER — TORSEMIDE 20 MG PO TABS: 40.0000 mg | ORAL_TABLET | Freq: Every day | ORAL | Status: DC

## 2019-09-23 MED ORDER — MAGNESIUM CITRATE PO SOLN
1.0000 | Freq: Once | ORAL | Status: DC | PRN
  Filled 2019-09-23: qty 296

## 2019-09-23 MED ORDER — ASCORBIC ACID 500 MG PO TABS
500.0000 mg | ORAL_TABLET | Freq: Two times a day (BID) | ORAL | Status: DC
  Administered 2019-09-24 – 2019-10-02 (×17): 500 mg via ORAL
  Filled 2019-09-23 (×17): qty 1

## 2019-09-23 MED ORDER — FLORANEX PO PACK
1.0000 g | PACK | Freq: Three times a day (TID) | ORAL | Status: DC
  Administered 2019-09-24 – 2019-09-27 (×10): 1 g via ORAL
  Filled 2019-09-23 (×14): qty 1

## 2019-09-23 MED ORDER — HYDROCODONE-ACETAMINOPHEN 5-325 MG PO TABS: 1.0000 | ORAL_TABLET | Freq: Four times a day (QID) | ORAL | Status: DC | PRN

## 2019-09-23 MED ORDER — ONDANSETRON HCL 4 MG PO TABS: 4.0000 mg | ORAL_TABLET | Freq: Four times a day (QID) | ORAL | Status: DC | PRN

## 2019-09-23 MED ORDER — ACETAMINOPHEN 325 MG PO TABS
650.0000 mg | ORAL_TABLET | Freq: Four times a day (QID) | ORAL | Status: DC | PRN
  Administered 2019-10-02: 09:00:00 650 mg via ORAL
  Filled 2019-09-23: qty 2

## 2019-09-23 NOTE — Consult Note (Signed)
PHARMACY -  BRIEF ANTIBIOTIC NOTE   Pharmacy has received consult(s) for sepsis from an ED provider.  The patient's profile has been reviewed for ht/wt/allergies/indication/available labs.    One time order(s) placed for cefepime and vancomycin. Loading dose of 2500 mg total will be ordered.   Further antibiotics/pharmacy consults should be ordered by admitting physician if indicated.                       Thank you, Oswald Hillock 09/23/2019  7:56 PM

## 2019-09-23 NOTE — H&P (Signed)
History and Physical    DASHAN CHIZMAR GEZ:[id number] DOB: 02-12-1951 DOA: 09/23/2019  PCP: [name], MD    Patient coming from: Nursing home    Chief Complaint: Was sent by nursing home for abnormal labs  HPI: Colton Lamb is a 69 y.o. male with medical history significant of congestive heart failure systolic dysfunction status post defibrillator, coronary artery disease, ischemic cardiomyopathy, atrial fibrillation anticoagulated, hypertension, hyperlipidemia, status post colostomy for protection multiple infection sacral wound, chronic osteomyelitis, sacral wound, came from nursing home for abnormal labs.  The blood work was obtained on 09/20/2019 with a sodium 08/02/2027 white count of 24,000.  ED Course: In the emergency room Sodium 134 potassium 3.5, BUN 50, creatinine 1.4 blood sugar 172 anion gap 17, white count 29,000, hemoglobin 10.9, platelets 333 Lactic acid 2.3 Chest x-ray suspicious of right lower lobe infiltrate .  Urine analysis white count more than 50, WBC clumps Patient started empirically on cefepime and vancomycin Was given 1 L of IV fluids  Review of Systems: As per HPI otherwise 10 point review of systems negative.  Except low blood pressure  Past Medical History:  Diagnosis Date  . Atrial flutter ([place])    a. s/p Cardioversion 11/22/13, on amiodarone and Xarelto.  . CHF (congestive heart failure) ([place])   . Chronic osteomyelitis ([place]) 06/30/2019   s/p colostomy  . Chronic systolic heart failure ([place])    a. 10/2013 EF 20-25%, grade III DD, RV mildly dilated and sys fx mild/mod reduced;  b. 01/2014 Echo: EF 30-35%, gr3 DD, mod dil LA. c. 07/2019 EF 20-25%  . Coronary artery disease    a. s/p MI 2007/2015;  b. s/p prior PCI to the LAD/LCX/PDA/PL;  c. 2008: s/p Cypher DES to the OM.  Marland Kitchen Crohn's ileocolitis ([place])   . GERD (gastroesophageal reflux disease)   . Hx of adenomatous colonic polyps 11/2003  . Hyperlipidemia   . Hypertension   . Hypotension     . Ischemic cardiomyopathy    s. 01/2014 s/p MDT [id number] [name] XT DR single lead AICD.  Marland Kitchen Obesity   . Paroxysmal atrial fibrillation (HCC)    a. CHA2DS2VASc = (CHF, HTN, agex1, DM)  . Sleep apnea   . Syncope    a.  11/2013 in setting of volume depletion and bradycardia due to dig toxicity   . Type II diabetes mellitus ([place])     Past Surgical History:  Procedure Laterality Date  . ATRIAL FLUTTER ABLATION N/A 04/16/2014   Procedure: ATRIAL FLUTTER ABLATION;  Surgeon: Evans Lance, MD;  Location: [facility] CATH LAB;  Service: Cardiovascular;  Laterality: N/A;  . CARDIAC CATHETERIZATION  10/2013  . CARDIAC DEFIBRILLATOR PLACEMENT  04/16/2014   Medtronic Evira device  . CARDIAC ELECTROPHYSIOLOGY STUDY AND ABLATION  04/16/2014   atrial flutter ablation  . CARDIOVERSION N/A 03/05/2014   Procedure: CARDIOVERSION;  Surgeon: Jolaine Artist, MD;  Location: [facility] ENDOSCOPY;  Service: Cardiovascular;  Laterality: N/A;  . CATARACT EXTRACTION W/PHACO Right 01/04/2017   Procedure: CATARACT EXTRACTION PHACO AND INTRAOCULAR LENS PLACEMENT ([place])  Right Diabetic Complicated;  Surgeon: Leandrew Koyanagi, MD;  Location: [place];  Service: Ophthalmology;  Laterality: Right;  Diabetic  . CATARACT EXTRACTION W/PHACO Left 02/08/2017   Procedure: CATARACT EXTRACTION PHACO AND INTRAOCULAR LENS PLACEMENT ([place]) left diabetic;  Surgeon: Leandrew Koyanagi, MD;  Location: [place];  Service: Ophthalmology;  Laterality: Left;  Diabetic - oral meds sleep apnea  . CORONARY ANGIOPLASTY WITH STENT PLACEMENT  2007; 2008 X  2   "1+1 ~ 1"  . FOOT SURGERY Left    bone spur  . HYDROCELE EXCISION Bilateral   . 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 IMPLANTABLE CARDIOVERTER DEFIBRILLATOR IMPLANT N/A 04/16/2014   Procedure: IMPLANTABLE CARDIOVERTER DEFIBRILLATOR IMPLANT;  Surgeon: Evans Lance, MD;  Location: [facility] CATH LAB;  Service: Cardiovascular;  Laterality: N/A;  . LEFT HEART  CATHETERIZATION WITH CORONARY ANGIOGRAM N/A 11/22/2013   Procedure: LEFT HEART CATHETERIZATION WITH CORONARY ANGIOGRAM;  Surgeon: Sinclair Grooms, MD;  Location: [facility] CATH LAB;  Service: Cardiovascular;  Laterality: N/A;  . TRANSVERSE LOOP COLOSTOMY N/A 07/03/2019   Procedure: TRANSVERSE LOOP COLOSTOMY;  Surgeon: Ronny Bacon, MD;  Location: [facility] ORS;  Service: General;  Laterality: N/A;     reports that he has never smoked. He has never used smokeless tobacco. He reports that he does not drink alcohol or use drugs.  Allergies  Allergen Reactions  . Iodine Other (See Comments)    Shortness of breath, swelling and hives  . Shrimp [Shellfish Allergy] Other (See Comments)    SWELLING    HIVES    SHORTNESS OF BREATH  . Tetracycline Rash    Family History  Problem Relation Age of Onset  . Breast cancer Mother   . Heart disease Father   . Heart attack Father   . Colon cancer Neg Hx      Prior to Admission medications   Medication Sig Start Date End Date Taking? Authorizing Provider  acetaminophen (TYLENOL) 325 MG tablet Take 1-2 tablets (325-650 mg total) by mouth every 4 (four) hours as needed for mild pain. 04/17/14  Yes [name], NP  Albuterol Sulfate 108 (90 Base) MCG/ACT AEPB Inhale 1 puff into the lungs every 6 (six) hours as needed (shortness of breath).    Yes [provider]  aspirin 81 MG tablet Take 81 mg by mouth daily.    Yes [provider]  atorvastatin (LIPITOR) 80 MG tablet TAKE 1 TABLET BY MOUTH EVERY DAY Patient taking differently: Take 80 mg by mouth daily.  03/08/19  Yes Gollan, Kathlene November, MD  balsalazide (COLAZAL) 750 MG capsule TAKE 1 CAPSULE (750 MG TOTAL) BY MOUTH 3 (THREE) TIMES DAILY. Patient taking differently: Take 750 mg by mouth 3 (three) times daily.  11/28/18  Yes Ladene Artist, MD  collagenase (SANTYL) ointment Apply 1 application topically daily. (apply to sacrum)   Yes [provider]  digoxin (LANOXIN) 0.125 MG  tablet Take 1 tablet (0.125 mg total) by mouth daily. 07/10/19  Yes [name], MD  finasteride (PROSCAR) 5 MG tablet Take 1 tablet (5 mg total) by mouth daily. 03/20/19  Yes Wieting, Richard, MD  HYDROcodone-acetaminophen (NORCO/VICODIN) 5-325 MG tablet Take 1 tablet by mouth every 6 (six) hours as needed for severe pain. 07/09/19  Yes [name], MD  insulin aspart (NOVOLOG) 100 UNIT/ML injection Inject 5 Units into the skin 3 (three) times daily with meals. Patient taking differently: Inject 5 Units into the skin 3 (three) times daily before meals.  03/19/19  Yes Wieting, Richard, MD  ipratropium-albuterol (DUONEB) 0.5-2.5 (3) MG/3ML SOLN Take 3 mLs by nebulization every 4 (four) hours as needed (shortness of breath or wheezing).   Yes [provider]  lactobacillus (FLORANEX/LACTINEX) PACK Take 1 packet (1 g total) by mouth 3 (three) times daily with meals. 03/19/19  Yes Wieting, Richard, MD  metoprolol succinate (TOPROL-XL) 25 MG 24 hr tablet Take 1 tablet (25 mg  total) by mouth daily. 07/10/19  Yes [name], MD  midodrine (PROAMATINE) 10 MG tablet Take 1 tablet (10 mg total) by mouth 3 (three) times daily. 04/26/19  Yes Max Sane, MD  Multiple Vitamins-Minerals (DECUBI-VITE PO) Take 1 capsule by mouth daily.    Yes [provider]  niacin (NIASPAN) 1000 MG CR tablet Take 1,000 mg by mouth at bedtime.    Yes [provider]  Nutritional Supplements (FEEDING SUPPLEMENT, NEPRO CARB STEADY,) LIQD Take 237 mLs by mouth 2 (two) times daily between meals. 03/19/19  Yes Wieting, Richard, MD  omeprazole (PRILOSEC) 20 MG capsule Take 20 mg by mouth daily.    Yes [provider]  ondansetron (ZOFRAN) 4 MG tablet Take 4 mg by mouth every 8 (eight) hours as needed for nausea or vomiting.   Yes [provider]  potassium chloride (KLOR-CON) 10 MEQ tablet Take 1 tablet (10 mEq total) by mouth daily. Patient taking differently: Take 10 mEq by mouth every evening.   05/08/19 09/23/19 Yes Gollan, Kathlene November, MD  sertraline (ZOLOFT) 25 MG tablet Take 25 mg by mouth daily.   Yes [provider]  tamsulosin (FLOMAX) 0.4 MG CAPS capsule Take 1 capsule (0.4 mg total) by mouth daily after breakfast. 03/20/19  Yes Leslye Peer, Richard, MD  torsemide (DEMADEX) 20 MG tablet Take 2 tablets (40 mg) by mouth once daily 05/08/19  Yes Gollan, Kathlene November, MD  vitamin C (VITAMIN C) 500 MG tablet Take 1 tablet (500 mg total) by mouth 2 (two) times daily. 07/09/19  Yes [name], MD  XARELTO 20 MG TABS tablet TAKE 1 TABLET BY MOUTH EVERY DAY WITH LUNCH Patient taking differently: Take 20 mg by mouth daily with lunch.  05/17/19  Yes Minna Merritts, MD    Physical Exam: Vitals:   09/23/19 1634 09/23/19 1730 09/23/19 1830 09/23/19 2000  BP:  93/71 106/68 119/83  Pulse: 88 96 (!) 48 73  Resp:  10 11 (!) 9  Temp:      TempSrc:      SpO2:  100% 100% 100%  Weight:      Height:        Constitutional: NAD, calm, comfortable Vitals:   09/23/19 1634 09/23/19 1730 09/23/19 1830 09/23/19 2000  BP:  93/71 106/68 119/83  Pulse: 88 96 (!) 48 73  Resp:  10 11 (!) 9  Temp:      TempSrc:      SpO2:  100% 100% 100%  Weight:      Height:       Eyes: PERRL, lids and conjunctivae normal ENMT: Mucous membranes are moist. Posterior pharynx clear of any exudate or lesions.Normal dentition.  Neck: normal, supple, no masses, no thyromegaly Respiratory: clear to auscultation bilaterally, no wheezing, no crackles. Normal respiratory effort. No accessory muscle use.  Cardiovascular: Regular rate and rhythm, no murmurs / rubs / gallops. No extremity edema. 2+ pedal pulses. No carotid bruits.  Abdomen: no tenderness, no masses palpated. No hepatosplenomegaly. Bowel sounds positive.  Status post transverse colostomy Musculoskeletal: no clubbing / cyanosis. No joint deformity upper and lower extremities. Good ROM, no contractures. Normal muscle tone.  Skin: Sacral wound unstageable  decubitus Neurologic: CN 2-12 grossly intact. Sensation intact, DTR normal. Strength 5/5 in all 4.  Psychiatric: Normal judgment and insight. Alert and oriented x 3. Normal mood.    Labs on Admission: I have personally reviewed following labs and imaging studies  CBC: Recent Labs  Lab 09/23/19 1723  WBC  29.7*  NEUTROABS 25.7*  HGB 10.9*  HCT 35.2*  MCV 81.3  PLT 240   Basic Metabolic Panel: Recent Labs  Lab 09/23/19 1740  NA 134*  K 3.5  CL 85*  CO2 32  GLUCOSE 172*  BUN 50*  CREATININE 1.43*  CALCIUM 9.0  MG 1.8  PHOS 2.9   GFR: Estimated Creatinine Clearance: 62 mL/min (A) (by C-G formula based on SCr of 1.43 mg/dL (H)). Liver Function Tests: Recent Labs  Lab 09/23/19 1740  AST 24  ALT 10  ALKPHOS 137*  BILITOT 0.8  PROT 7.3  ALBUMIN 2.5*   No results for input(s): LIPASE, AMYLASE in the last 168 hours. No results for input(s): AMMONIA in the last 168 hours. Coagulation Profile: No results for input(s): INR, PROTIME in the last 168 hours. Cardiac Enzymes: No results for input(s): CKTOTAL, CKMB, CKMBINDEX, TROPONINI in the last 168 hours. BNP (last 3 results) No results for input(s): PROBNP in the last 8760 hours. HbA1C: No results for input(s): HGBA1C in the last 72 hours. CBG: No results for input(s): GLUCAP in the last 168 hours. Lipid Profile: No results for input(s): CHOL, HDL, LDLCALC, TRIG, CHOLHDL, LDLDIRECT in the last 72 hours. Thyroid Function Tests: No results for input(s): TSH, T4TOTAL, FREET4, T3FREE, THYROIDAB in the last 72 hours. Anemia Panel: No results for input(s): VITAMINB12, FOLATE, FERRITIN, TIBC, IRON, RETICCTPCT in the last 72 hours. Urine analysis:    Component Value Date/Time   COLORURINE AMBER (A) 09/23/2019 1817   APPEARANCEUR TURBID (A) 09/23/2019 1817   LABSPEC 1.013 09/23/2019 1817   PHURINE 5.0 09/23/2019 1817   GLUCOSEU NEGATIVE 09/23/2019 1817   HGBUR NEGATIVE 09/23/2019 1817   BILIRUBINUR NEGATIVE 09/23/2019  1817   KETONESUR NEGATIVE 09/23/2019 1817   PROTEINUR NEGATIVE 09/23/2019 1817   UROBILINOGEN 1.0 11/27/2013 1017   NITRITE NEGATIVE 09/23/2019 1817   LEUKOCYTESUR LARGE (A) 09/23/2019 1817    Radiological Exams on Admission: DG Chest Portable 1 View  Result Date: 09/23/2019 CLINICAL DATA:  69 year old male with leukocytosis EXAM: PORTABLE CHEST 1 VIEW COMPARISON:  Chest radiograph dated 07/15/2019. FINDINGS: Faint right lung base density may represent atelectasis. Trace right pleural effusion is not excluded. Clinical correlation is recommended. No pneumothorax. Borderline cardiomegaly. There is improved cardiac size since the prior radiograph. Left pectoral AICD device. No acute osseous pathology. IMPRESSION: 1. Minimal density at the right lung base may represent atelectasis or trace pleural effusion. Clinical correlation is recommended. 2. Improved cardiac size since the prior radiograph. Electronically Signed   By: Anner Crete M.D.   On: 09/23/2019 19:25    EKG: Independently reviewed.  Atrial fibrillation rate 94 no acute ST-T changes  Assessment plan  Sepsis 29,000 white count, lactic acid 2.3, hypotension blood pressure 93/71, heart rate 96 Suspicion source Healthcare associated pneumonia, acute on chronic osteomyelitis sacral area, questionable UTI Plan blood cultures, urine culture, serial lactic acid, IV fluids, procalcitonin, will continue with antibiotic started in the emergency room cefepime and vancomycin  Healthcare associated pneumonia Patient nursing home resident Chest x-ray faint right lung base density may represent atelectasis questionable infiltrate Plan procalcitonin resume cefepime and vancomycin  Sacral decubitus Decubitus ulcer sacral region unstageable, with stage IV/covered with dressing Plan consult wound care, patient has follow-up by wound care at nursing home He has a transverse colostomy to prevent multiple infection of the sacral wound Has  history of chronic osteomyelitis Plan CT pelvis, surgical consult, wound care consult, continue with Santyl  Acute kidney injury BUN 50 creatinine  1.4 was on 07/11/2019 BUN is 13 creatinine 0.7 Plan IV fluids hold diuretics  torsemide, avoid nephrotoxin follow BUN and  creatinine  Questionable UTI Urine cultures, cefepime  Atrial fibrillation Rate controlled Patient on Xarelto, hold metoprolol, continue with digoxin check dig level because increased BUN and creatinine  Coronary artery disease/ status post angioplasty Resume aspirin Lipitor niacin  Chronic congestive heart failure systolic dysfunction/ischemic cardiomyopathy With ICD Hold torsemide, follow with cardiology  Essential hypertension Hold p.o. medication because of hypotension  Diabetes mellitus type 2 Insulin sliding scale per sensitivity factor  Hyperlipidemia Lipitor, niacin  GERD PPI  Status post colostomy For protection multiple infection sacral wound If white count does not improve, check for C. difficile because of very elevated white count  Assessment/Plan Principal Problem:   Sepsis ([place]) Active Problems:   Hyperlipidemia   CAD S/P percutaneous coronary angioplasty - multiple PCIs   Atrial fibrillation (HCC)   GERD   CROHN'S DISEASE-LARGE & SMALL INTESTINE   Long term current use of anticoagulant   Cardiomyopathy, ischemic   OSA (obstructive sleep apnea)   ICD (implantable cardioverter-defibrillator) in place   Diabetes mellitus type 2, uncontrolled, with complications (HCC)   Type II diabetes mellitus (HCC)   Hypertension, essential   Pressure injury of skin   Decubitus ulcer of sacral region, unstageable ([place])   Chronic osteomyelitis ([place])   HCAP (healthcare-associated pneumonia)   Colostomy in place [facility])      DVT prophylaxis: Xarelto Code Status: Full code Family Communication: Need to call in the morning Disposition Plan: Discharge to nursing home Consults called:  No Admission status: Full admission   Annia Gomm G Antwyne Pingree MD [facility]  If 7PM-7AM, please contact night-coverage [email or web address]   09/23/2019, 10:03 PM

## 2019-09-23 NOTE — ED Triage Notes (Signed)
PT to ED via EMS from [facility]. EMS was called out d/t elevated white count and low sodium. WBC as of 2/19 was 24.7 and Na was 129. PT has no complaints. Alert and oriented. No SOB.

## 2019-09-23 NOTE — ED Notes (Signed)
Pt arrived to ED with dressings to sacrum for pressure sores

## 2019-09-23 NOTE — Progress Notes (Signed)
CODE SEPSIS - PHARMACY COMMUNICATION  **Broad Spectrum Antibiotics should be administered within 1 hour of Sepsis diagnosis**  Time Code Sepsis Called/Page Received: 2/22 @ 2130   Antibiotics Ordered: Vanc, Cefepime   Time of 1st antibiotic administration:  Cefepime 2 gm IV x 1 on 2/22 @ 2100.   Additional action taken by pharmacy:   If necessary, Name of Provider/Nurse Contacted:     Arriona Prest D ,PharmD Clinical Pharmacist  09/23/2019  9:40 PM

## 2019-09-23 NOTE — ED Notes (Signed)
Dr Cherylann Banas notified of lactic 2.3. fluids ordered

## 2019-09-23 NOTE — ED Notes (Signed)
Pt difficult stick, another RN to attempt to get cultures

## 2019-09-23 NOTE — ED Provider Notes (Signed)
[facility] Emergency Department Provider Note ____________________________________________   First MD Initiated Contact with Patient 09/23/19 1648     (approximate)  I have reviewed the triage vital signs and the nursing notes.   HISTORY  Chief Complaint Abnormal Lab    HPI Colton Lamb is a 69 y.o. male with PMH as noted below who presents for evaluation of lab abnormalities found on labs that were obtained on 2/19.  Specifically, the patient was noted to have hyponatremia to 129 and a WBC count of 24.  The patient denies any acute complaints.  Past Medical History:  Diagnosis Date  . Atrial flutter ([place])    a. s/p Cardioversion 11/22/13, on amiodarone and Xarelto.  . CHF (congestive heart failure) ([place])   . Chronic osteomyelitis ([place]) 06/30/2019   s/p colostomy  . Chronic systolic heart failure ([place])    a. 10/2013 EF 20-25%, grade III DD, RV mildly dilated and sys fx mild/mod reduced;  b. 01/2014 Echo: EF 30-35%, gr3 DD, mod dil LA. c. 07/2019 EF 20-25%  . Coronary artery disease    a. s/p MI 2007/2015;  b. s/p prior PCI to the LAD/LCX/PDA/PL;  c. 2008: s/p Cypher DES to the OM.  Marland Kitchen Crohn's ileocolitis ([place])   . GERD (gastroesophageal reflux disease)   . Hx of adenomatous colonic polyps 11/2003  . Hyperlipidemia   . Hypertension   . Hypotension   . Ischemic cardiomyopathy    s. 01/2014 s/p MDT [id number] [name] XT DR single lead AICD.  Marland Kitchen Obesity   . Paroxysmal atrial fibrillation (HCC)    a. CHA2DS2VASc = (CHF, HTN, agex1, DM)  . Sleep apnea   . Syncope    a.  11/2013 in setting of volume depletion and bradycardia due to dig toxicity   . Type II diabetes mellitus [facility])     Patient Active Problem List   Diagnosis Date Noted  . Bleeding 07/11/2019  . Chronic osteomyelitis ([place]) 06/30/2019  . PICC (peripherally inserted central catheter) flush   . Normocytic anemia 06/21/2019  . 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  Hypotension 06/19/2019  . Anemia   . Acute respiratory failure ([place]) 06/12/2019  . Acute on chronic congestive heart failure ([place])   . Acute blood loss anemia   . Pressure injury of skin 03/17/2019  . Acute renal failure (ARF) ([place])   . Empyema lung ([place])   . Shortness of breath   . Pleural effusion on right   . Sepsis ([place]) 03/04/2019  . Restless legs syndrome (RLS) 06/16/2017  . Hypertension, essential 06/16/2017  . CAD in native artery 04/27/2017  . Hydrocephalus ([place]) 09/08/2016  . Dizziness and giddiness   . Elevated troponin 09/06/2016  . Type II diabetes mellitus ([place])   . Ischemic cardiomyopathy   . Hyponatremia 07/01/2015  . Diabetes mellitus type 2, uncontrolled, with complications ([place]) 48/88/9169  . 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 OSA (obstructive sleep apnea) 12/20/2013  . Morbid obesity ([place]) 12/20/2013  . AKI (acute kidney injury) - Creatinine improved at d/c 12/14/2013  . Elevated TSH - will need f/u TFTs with PCP in 3-4 weeks 12/14/2013  . Junctional bradycardia - resolved 12/14/2013  . Syncope - due to bradycardia in setting of Digoxin Toxicity 12/11/2013  . Acute on chronic HFrEF (heart failure with reduced ejection fraction) ([place]) 12/06/2013  . At risk for sudden cardiac death - on LifeVest 12-22-2013  . Acute on chronic combined systolic and  diastolic CHF (congestive heart failure) ([place]) 11/25/2013  . Cardiomyopathy, ischemic 11/25/2013  . Atrial flutter ([place]) 11/22/2013  . NSTEMI (non-ST elevated myocardial infarction) ([place]) 11/22/2013  . Long term current use of anticoagulant 10/19/2010  . Hyperlipidemia 05/06/2010  . Edema 05/06/2010  . CAD S/P percutaneous coronary angioplasty - multiple PCIs 11/11/2008  . Atrial fibrillation ([place]) 11/11/2008  . GERD 11/11/2008  . [place] INTESTINE 11/11/2008  . COLONIC POLYPS, HX OF 11/11/2008  . [place] ALLERGY 11/11/2008    Past Surgical History:    Procedure Laterality Date  . ATRIAL FLUTTER ABLATION N/A 04/16/2014   Procedure: ATRIAL FLUTTER ABLATION;  Surgeon: Evans Lance, MD;  Location: [facility] CATH LAB;  Service: Cardiovascular;  Laterality: N/A;  . CARDIAC CATHETERIZATION  10/2013  . CARDIAC DEFIBRILLATOR PLACEMENT  04/16/2014   Medtronic Evira device  . CARDIAC ELECTROPHYSIOLOGY STUDY AND ABLATION  04/16/2014   atrial flutter ablation  . CARDIOVERSION N/A 03/05/2014   Procedure: CARDIOVERSION;  Surgeon: Jolaine Artist, MD;  Location: [facility] ENDOSCOPY;  Service: Cardiovascular;  Laterality: N/A;  . CATARACT EXTRACTION W/PHACO Right 01/04/2017   Procedure: CATARACT EXTRACTION PHACO AND INTRAOCULAR LENS PLACEMENT ([place])  Right Diabetic Complicated;  Surgeon: Leandrew Koyanagi, MD;  Location: [place];  Service: Ophthalmology;  Laterality: Right;  Diabetic  . CATARACT EXTRACTION W/PHACO Left 02/08/2017   Procedure: CATARACT EXTRACTION PHACO AND INTRAOCULAR LENS PLACEMENT ([place]) left diabetic;  Surgeon: Leandrew Koyanagi, MD;  [facility];  Service: Ophthalmology;  Laterality: Left;  Diabetic - oral meds sleep apnea  . CORONARY ANGIOPLASTY WITH STENT PLACEMENT  2007; 2008 X 2   "1+1 ~ 1"  . FOOT SURGERY Left    bone spur  . HYDROCELE EXCISION Bilateral   . 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 IMPLANTABLE CARDIOVERTER DEFIBRILLATOR IMPLANT N/A 04/16/2014   Procedure: IMPLANTABLE CARDIOVERTER DEFIBRILLATOR IMPLANT;  Surgeon: Evans Lance, MD;  Location: [name] CATH LAB;  Service: Cardiovascular;  Laterality: N/A;  . LEFT HEART CATHETERIZATION WITH CORONARY ANGIOGRAM N/A 11/22/2013   Procedure: LEFT HEART CATHETERIZATION WITH CORONARY ANGIOGRAM;  Surgeon: Sinclair Grooms, MD;  Location: [facility] CATH LAB;  Service: Cardiovascular;  Laterality: N/A;  . TRANSVERSE LOOP COLOSTOMY N/A 07/03/2019   Procedure: TRANSVERSE LOOP COLOSTOMY;  Surgeon: Ronny Bacon, MD;  Location: [facility] ORS;   Service: General;  Laterality: N/A;    Prior to Admission medications   Medication Sig Start Date End Date Taking? Authorizing Provider  acetaminophen (TYLENOL) 325 MG tablet Take 1-2 tablets (325-650 mg total) by mouth every 4 (four) hours as needed for mild pain. 04/17/14  Yes [name], NP  Albuterol Sulfate 108 (90 Base) MCG/ACT AEPB Inhale 1 puff into the lungs every 6 (six) hours as needed (shortness of breath).    Yes [provider]  aspirin 81 MG tablet Take 81 mg by mouth daily.    Yes [provider]  atorvastatin (LIPITOR) 80 MG tablet TAKE 1 TABLET BY MOUTH EVERY DAY Patient taking differently: Take 80 mg by mouth daily.  03/08/19  Yes Gollan, Kathlene November, MD  balsalazide (COLAZAL) 750 MG capsule TAKE 1 CAPSULE (750 MG TOTAL) BY MOUTH 3 (THREE) TIMES DAILY. Patient taking differently: Take 750 mg by mouth 3 (three) times daily.  11/28/18  Yes Ladene Artist, MD  collagenase (SANTYL) ointment Apply 1 application topically daily. (apply to sacrum)   Yes [provider]  digoxin (LANOXIN) 0.125 MG tablet Take 1 tablet (  0.125 mg total) by mouth daily. 07/10/19  Yes [name], MD  finasteride (PROSCAR) 5 MG tablet Take 1 tablet (5 mg total) by mouth daily. 03/20/19  Yes Wieting, Richard, MD  HYDROcodone-acetaminophen (NORCO/VICODIN) 5-325 MG tablet Take 1 tablet by mouth every 6 (six) hours as needed for severe pain. 07/09/19  Yes [name], MD  insulin aspart (NOVOLOG) 100 UNIT/ML injection Inject 5 Units into the skin 3 (three) times daily with meals. Patient taking differently: Inject 5 Units into the skin 3 (three) times daily before meals.  03/19/19  Yes Wieting, Richard, MD  ipratropium-albuterol (DUONEB) 0.5-2.5 (3) MG/3ML SOLN Take 3 mLs by nebulization every 4 (four) hours as needed (shortness of breath or wheezing).   Yes [provider]  lactobacillus (FLORANEX/LACTINEX) PACK Take 1 packet (1 g total) by mouth 3 (three) times daily with  meals. 03/19/19  Yes Wieting, Richard, MD  metoprolol succinate (TOPROL-XL) 25 MG 24 hr tablet Take 1 tablet (25 mg total) by mouth daily. 07/10/19  Yes [name], MD  midodrine (PROAMATINE) 10 MG tablet Take 1 tablet (10 mg total) by mouth 3 (three) times daily. 04/26/19  Yes Max Sane, MD  Multiple Vitamins-Minerals (DECUBI-VITE PO) Take 1 capsule by mouth daily.    Yes [provider]  niacin (NIASPAN) 1000 MG CR tablet Take 1,000 mg by mouth at bedtime.    Yes [provider]  Nutritional Supplements (FEEDING SUPPLEMENT, NEPRO CARB STEADY,) LIQD Take 237 mLs by mouth 2 (two) times daily between meals. 03/19/19  Yes Wieting, Richard, MD  omeprazole (PRILOSEC) 20 MG capsule Take 20 mg by mouth daily.    Yes [provider]  ondansetron (ZOFRAN) 4 MG tablet Take 4 mg by mouth every 8 (eight) hours as needed for nausea or vomiting.   Yes [provider]  potassium chloride (KLOR-CON) 10 MEQ tablet Take 1 tablet (10 mEq total) by mouth daily. Patient taking differently: Take 10 mEq by mouth every evening.  05/08/19 09/23/19 Yes Gollan, Kathlene November, MD  sertraline (ZOLOFT) 25 MG tablet Take 25 mg by mouth daily.   Yes [provider]  tamsulosin (FLOMAX) 0.4 MG CAPS capsule Take 1 capsule (0.4 mg total) by mouth daily after breakfast. 03/20/19  Yes Leslye Peer, Richard, MD  torsemide (DEMADEX) 20 MG tablet Take 2 tablets (40 mg) by mouth once daily 05/08/19  Yes Gollan, Kathlene November, MD  vitamin C (VITAMIN C) 500 MG tablet Take 1 tablet (500 mg total) by mouth 2 (two) times daily. 07/09/19  Yes [name], MD  XARELTO 20 MG TABS tablet TAKE 1 TABLET BY MOUTH EVERY DAY WITH LUNCH Patient taking differently: Take 20 mg by mouth daily with lunch.  05/17/19  Yes Minna Merritts, MD    Allergies Iodine, Shrimp [shellfish allergy], and Tetracycline  Family History  Problem Relation Age of Onset  . Breast cancer Mother   . Heart disease Father   . Heart attack Father    . Colon cancer Neg Hx     Social History Social History   Tobacco Use  . Smoking status: Never Smoker  . Smokeless tobacco: Never Used  Substance Use Topics  . Alcohol use: No  . Drug use: No    Review of Systems  Constitutional: No fever/chills.  No weakness. Eyes: No visual changes. ENT: No sore throat. Cardiovascular: Denies chest pain. Respiratory: Denies shortness of breath. Gastrointestinal: No vomiting or diarrhea.  Genitourinary: Negative for dysuria.  Musculoskeletal: Negative for back pain. Skin:  Negative for rash. Neurological: Negative for headaches, focal weakness or numbness.   ____________________________________________   PHYSICAL EXAM:  VITAL SIGNS: ED Triage Vitals  Enc Vitals Group     BP --      Pulse Rate 09/23/19 1633 100     Resp 09/23/19 1633 20     Temp 09/23/19 1633 97.8 F (36.6 C)     Temp Source 09/23/19 1633 Oral     SpO2 09/23/19 1633 100 %     Weight 09/23/19 1629 240 lb (108.9 kg)     Height 09/23/19 1629 5' 11"  (1.803 m)     Head Circumference --      Peak Flow --      Pain Score 09/23/19 1629 0     Pain Loc --      Pain Edu? --      Excl. in [place]? --     Constitutional: Alert and oriented.  Relatively comfortable appearing, in no acute distress. Eyes: Conjunctivae are normal.  EOMI. Head: Atraumatic. Nose: No congestion/rhinnorhea. Mouth/Throat: Mucous membranes are dry.   Neck: Normal range of motion.  Cardiovascular: Normal rate, regular rhythm. Grossly normal heart sounds.  Good peripheral circulation. Respiratory: Normal respiratory effort.  No retractions. Lungs CTAB. Gastrointestinal: Soft and nontender. No distention.  Genitourinary: No flank tenderness. Musculoskeletal: No lower extremity edema.  Extremities warm and well perfused.  Neurologic:  Normal speech and language. No gross focal neurologic deficits are appreciated.  Skin:  Skin is warm and dry. No rash noted. Psychiatric: Mood and affect are normal.  Speech and behavior are normal.  ____________________________________________   LABS (all labs ordered are listed, but only abnormal results are displayed)  Labs Reviewed  CBC WITH DIFFERENTIAL/PLATELET - Abnormal; Notable for the following components:      Result Value   WBC 29.7 (*)    Hemoglobin 10.9 (*)    HCT 35.2 (*)    MCH 25.2 (*)    RDW 18.9 (*)    Neutro Abs 25.7 (*)    Abs Immature Granulocytes 0.59 (*)    All other components within normal limits  LACTIC ACID, PLASMA - Abnormal; Notable for the following components:   Lactic Acid, Venous 2.3 (*)    All other components within normal limits  URINALYSIS, COMPLETE (UACMP) WITH MICROSCOPIC - Abnormal; Notable for the following components:   Color, Urine AMBER (*)    APPearance TURBID (*)    Leukocytes,Ua LARGE (*)    WBC, UA >50 (*)    All other components within normal limits  COMPREHENSIVE METABOLIC PANEL - Abnormal; Notable for the following components:   Sodium 134 (*)    Chloride 85 (*)    Glucose, Bld 172 (*)    BUN 50 (*)    Creatinine, Ser 1.43 (*)    Albumin 2.5 (*)    Alkaline Phosphatase 137 (*)    GFR calc non Af Amer 50 (*)    GFR calc Af Amer 58 (*)    Anion gap 17 (*)    All other components within normal limits  CULTURE, BLOOD (ROUTINE X 2)  CULTURE, BLOOD (ROUTINE X 2)  SARS CORONAVIRUS 2 (TAT 6-24 HRS)  LACTIC ACID, PLASMA  MAGNESIUM  PHOSPHORUS  LACTIC ACID, PLASMA   ____________________________________________  EKG  ED ECG REPORT I, Arta Silence, the attending physician, personally viewed and interpreted this ECG.  Date: 09/23/2019 EKG Time: 1647 Rate: 94 Rhythm: Atrial fibrillation QRS Axis: normal Intervals: LAFB  ST/T Wave abnormalities: Nonspecific  ST abnormality Narrative Interpretation: Nonspecific abnormalities with no evidence of acute ischemia  ____________________________________________  RADIOLOGY  CXR: Right lung base atelectasis versus  effusion  ____________________________________________   PROCEDURES  Procedure(s) performed: No  Procedures  Critical Care performed: No ____________________________________________   INITIAL IMPRESSION / ASSESSMENT AND PLAN / ED COURSE  Pertinent labs & imaging results that were available during my care of the patient were reviewed by me and considered in my medical decision making (see chart for details).  69 year old male with PMH as noted above including atrial fibrillation CAD, CHF, and renal insufficiency presents for evaluation of an elevated WBC count and low sodium seen on lab work-up obtained on 2/19.  The patient himself is without complaints.  I reviewed the past medical records in [place].  The patient was admitted for a diverting loop colostomy in December for stool diversion due to a stage IV sacral ulcer.  He was also admitted around that time for acute on chronic CHF.  On exam today, the patient is relatively comfortable appearing.  His vital signs are normal except for borderline low BP.  He has dry mucous membranes but an otherwise unremarkable physical exam.  Given that the patient has no specific complaints to work-up, we will obtain repeat labs to reevaluate the abnormal findings on the lab work-up from a few days ago, as well as urinalysis and lactic acid given the elevated WBC count and concern that this could represent a possible infection.  ----------------------------------------- 8:38 PM on 09/23/2019 -----------------------------------------  Lab work-up reveals a normal sodium, but a WBC count even higher than previously.  The patient's lactic acid is also elevated.  The source of potential infection is unclear, although he does have a possible infiltrate on his chest x-ray as well as urinalysis showing significant WBCs.  I ordered broad-spectrum antibiotics per the sepsis protocol.  Given these findings, I will admit him for further management and work-up.   I discussed the case with the hospitalist.  ____________________________  [name] was evaluated in Emergency Department on 09/23/2019 for the symptoms described in the history of present illness. He was evaluated in the context of the global COVID-19 pandemic, which necessitated consideration that the patient might be at risk for infection with the SARS-CoV-2 virus that causes COVID-19. Institutional protocols and algorithms that pertain to the evaluation of patients at risk for COVID-19 are in a state of rapid change based on information released by regulatory bodies including the CDC and federal and state organizations. These policies and algorithms were followed during the patient's care in the ED.  ____________________________________________   FINAL CLINICAL IMPRESSION(S) / ED DIAGNOSES  Final diagnoses:  Sepsis without acute organ dysfunction, due to unspecified organism [facility])      NEW MEDICATIONS STARTED DURING THIS VISIT:  New Prescriptions   No medications on file     Note:  This document was prepared using Dragon voice recognition software and may include unintentional dictation errors.    Arta Silence, MD 09/23/19 2040

## 2019-09-23 NOTE — Progress Notes (Signed)
Pharmacy Antibiotic Note  Colton Lamb is a 69 y.o. male admitted on 09/23/2019 with sepsis. Pharmacy has been consulted for Vancomycin , Cefepime  dosing.  Plan: Cefepime 2 gm IV X 1 given on 2/22 @ 2100. Cefepime 2 gm IV Q8H ordered to continue on 2/23 @ 0500.  Vancomycin 2500 mg IV X 1 ordered to be given on 2/22 @ ~ 2200.  Vancomycin 1250 mg IV Q24H ordered to start on 2/23 @ 2200.  No peak or trough currently ordered.   Vd = 54.4 L  Ke = 0.048 hr-1 T1/2 = 14.4 hrs AUC = 477.2  Vanc trough = 11.4   Height: 5' 11"  (180.3 cm) Weight: 240 lb (108.9 kg) IBW/kg (Calculated) : 75.3  Temp (24hrs), Avg:97.8 F (36.6 C), Min:97.8 F (36.6 C), Max:97.8 F (36.6 C)  Recent Labs  Lab 09/23/19 1703 09/23/19 1723 09/23/19 1740 09/23/19 1903  WBC  --  29.7*  --   --   CREATININE  --   --  1.43*  --   LATICACIDVEN 1.9  --   --  2.3*    Estimated Creatinine Clearance: 62 mL/min (A) (by C-G formula based on SCr of 1.43 mg/dL (H)).    Allergies  Allergen Reactions  . Iodine Other (See Comments)    Shortness of breath, swelling and hives  . Shrimp [Shellfish Allergy] Other (See Comments)    SWELLING    HIVES    SHORTNESS OF BREATH  . Tetracycline Rash    Antimicrobials this admission:   >>    >>   Dose adjustments this admission:   Microbiology results:  BCx:   UCx:    Sputum:    MRSA PCR:   Thank you for allowing pharmacy to be a part of this patient's care.  Weslie Rasmus D 09/23/2019 9:35 PM

## 2019-09-23 NOTE — ED Notes (Signed)
Cultures collected by Raquel RN

## 2019-09-23 NOTE — Progress Notes (Signed)
Jonny Ruiz, NP notified via secure chat of hypotension. New orders written. Barbaraann Faster, RN 12:25 AM 09/24/2019

## 2019-09-24 ENCOUNTER — Inpatient Hospital Stay: Payer: Medicare Other

## 2019-09-24 LAB — CBC WITH DIFFERENTIAL/PLATELET
Abs Immature Granulocytes: 0.45 10*3/uL — ABNORMAL HIGH (ref 0.00–0.07)
Basophils Absolute: 0.1 10*3/uL (ref 0.0–0.1)
Basophils Relative: 0 %
Eosinophils Absolute: 0.2 10*3/uL (ref 0.0–0.5)
Eosinophils Relative: 1 %
HCT: 31.1 % — ABNORMAL LOW (ref 39.0–52.0)
Hemoglobin: 9.6 g/dL — ABNORMAL LOW (ref 13.0–17.0)
Immature Granulocytes: 2 %
Lymphocytes Relative: 8 %
Lymphs Abs: 1.9 10*3/uL (ref 0.7–4.0)
MCH: 25.1 pg — ABNORMAL LOW (ref 26.0–34.0)
MCHC: 30.9 g/dL (ref 30.0–36.0)
MCV: 81.4 fL (ref 80.0–100.0)
Monocytes Absolute: 0.9 10*3/uL (ref 0.1–1.0)
Monocytes Relative: 4 %
Neutro Abs: 21.2 10*3/uL — ABNORMAL HIGH (ref 1.7–7.7)
Neutrophils Relative %: 85 %
Platelets: 278 10*3/uL (ref 150–400)
RBC: 3.82 MIL/uL — ABNORMAL LOW (ref 4.22–5.81)
RDW: 18.4 % — ABNORMAL HIGH (ref 11.5–15.5)
WBC: 24.6 10*3/uL — ABNORMAL HIGH (ref 4.0–10.5)
nRBC: 0 % (ref 0.0–0.2)

## 2019-09-24 LAB — PROTIME-INR
INR: 2.5 — ABNORMAL HIGH (ref 0.8–1.2)
Prothrombin Time: 26.8 seconds — ABNORMAL HIGH (ref 11.4–15.2)

## 2019-09-24 LAB — DIGOXIN LEVEL: Digoxin Level: 1.4 ng/mL (ref 0.8–2.0)

## 2019-09-24 LAB — BASIC METABOLIC PANEL
Anion gap: 13 (ref 5–15)
BUN: 48 mg/dL — ABNORMAL HIGH (ref 8–23)
CO2: 31 mmol/L (ref 22–32)
Calcium: 7.9 mg/dL — ABNORMAL LOW (ref 8.9–10.3)
Chloride: 84 mmol/L — ABNORMAL LOW (ref 98–111)
Creatinine, Ser: 1.42 mg/dL — ABNORMAL HIGH (ref 0.61–1.24)
GFR calc Af Amer: 58 mL/min — ABNORMAL LOW (ref >60)
GFR calc non Af Amer: 50 mL/min — ABNORMAL LOW (ref >60)
Glucose, Bld: 159 mg/dL — ABNORMAL HIGH (ref 70–99)
Potassium: 3.8 mmol/L (ref 3.5–5.1)
Sodium: 128 mmol/L — ABNORMAL LOW (ref 135–145)

## 2019-09-24 LAB — GLUCOSE, CAPILLARY
Glucose-Capillary: 126 mg/dL — ABNORMAL HIGH (ref 70–99)
Glucose-Capillary: 123 mg/dL — ABNORMAL HIGH (ref 70–99)
Glucose-Capillary: 163 mg/dL — ABNORMAL HIGH (ref 70–99)
Glucose-Capillary: 171 mg/dL — ABNORMAL HIGH (ref 70–99)

## 2019-09-24 LAB — LACTIC ACID, PLASMA: Lactic Acid, Venous: 1.3 mmol/L (ref 0.5–1.9)

## 2019-09-24 LAB — SARS CORONAVIRUS 2 (TAT 6-24 HRS): SARS Coronavirus 2: POSITIVE — AB

## 2019-09-24 LAB — C-REACTIVE PROTEIN: CRP: 20.5 mg/dL — ABNORMAL HIGH (ref <1.0)

## 2019-09-24 LAB — PROCALCITONIN: Procalcitonin: 0.8 ng/mL

## 2019-09-24 LAB — FIBRIN DERIVATIVES D-DIMER (ARMC ONLY): Fibrin derivatives D-dimer (ARMC): 570.11 ng/mL (FEU) — ABNORMAL HIGH (ref 0.00–499.00)

## 2019-09-24 LAB — CORTISOL-AM, BLOOD: Cortisol - AM: 27.7 ug/dL — ABNORMAL HIGH (ref 6.7–22.6)

## 2019-09-24 MED ORDER — DEXAMETHASONE SODIUM PHOSPHATE 10 MG/ML IJ SOLN
6.0000 mg | Freq: Every day | INTRAMUSCULAR | Status: DC
  Administered 2019-09-24: 6 mg via INTRAVENOUS

## 2019-09-24 MED ORDER — VANCOMYCIN HCL 1250 MG/250ML IV SOLN
1250.0000 mg | INTRAVENOUS | Status: DC
  Administered 2019-09-24: 1250 mg via INTRAVENOUS
  Filled 2019-09-24: qty 250

## 2019-09-24 MED ORDER — SODIUM CHLORIDE 0.9 % IV SOLN
1.0000 g | INTRAVENOUS | Status: DC
  Administered 2019-09-24: 1 g via INTRAVENOUS
  Filled 2019-09-24: qty 10
  Filled 2019-09-24: qty 1

## 2019-09-24 MED ORDER — SODIUM CHLORIDE 0.9 % IV SOLN
2.0000 g | Freq: Three times a day (TID) | INTRAVENOUS | Status: DC
  Administered 2019-09-24 – 2019-09-25 (×2): 2 g via INTRAVENOUS
  Filled 2019-09-24 (×3): qty 2

## 2019-09-24 MED ORDER — AZITHROMYCIN 500 MG PO TABS
500.0000 mg | ORAL_TABLET | Freq: Every day | ORAL | Status: DC
  Administered 2019-09-24: 11:00:00 500 mg via ORAL
  Filled 2019-09-24: qty 1

## 2019-09-24 NOTE — Consult Note (Signed)
WOC Nurse Consult Note: Reason for Consult:Nonhealing stage 4 sacral pressure injury with exposed coccyx and chronic osteomyelitis.  Resident of [facility].  Wound type:stage 4 pressure injury Pressure Injury POA: Yes Measurement: 16 cm x 11 cm x 4 cm with exposed coccxy at 12 o'clock.  Patient able to turn and reposition in bed with minimal assistance.  He states he thinks wound is getting better.  Wound bed: 5 % adherent slough  20% exposed bone 75% pale pink nongranulating tissue.  Musty odor Drainage (amount, consistency, odor) moderate serosanguinous  Musty odor Periwound:skin is always moist Dressing procedure/placement/frequency: Cleanse sacral wound with NS and pat dry. Line wound bed with aquacel sheets (3) and fill remaining wound depth with fluffed kerlix.  Top with ABD Pad and tape. Change M/W/F. Will not follow at this time.  Please re-consult if needed.  [place] Nurse ostomy follow up Stoma type/location: LLQ  colostomy Stomal assessment/size: 1 1/2" pink moist  Peristomal assessment: intact Treatment options for stomal/peristomal skin: barrier ring and 2 piece 2 3/4" pouch Output soft brown stool Ostomy pouching: 2pc. 2 3/4" pouch with barrier ring  Education provided: none Enrolled patient in [employer] Discharge program: /No Will not follow at this time.  Please re-consult if needed.  Domenic Moras MSN, RN, FNP-BC CWON Wound, Ostomy, Continence Nurse Pager [phone]

## 2019-09-24 NOTE — Progress Notes (Signed)
E.Ouma, NP notified and acknowledged 34 sec. of V-tach per Saralyn Pilar, CCMD.  Patient in afib HR 82. No new orders. Barbaraann Faster, RN 12:27 AM 09/24/2019

## 2019-09-24 NOTE — Progress Notes (Addendum)
Colton Ruiz, NP notified of hypotension: 87/61, pulse 69; asymptomatic. Acknowledged. Barbaraann Faster, RN 5:59 AM 09/24/2019  "Keep MAP >65" via secure chat. Barbaraann Faster, RN

## 2019-09-24 NOTE — Consult Note (Signed)
Pharmacy Antibiotic Note  Colton Lamb is a 69 y.o. male with medical history including CHF s/p defibrillator, CAD, atrial fibrillation, hypertension, sacral wound s/p colostomy, chronic osteomyelitis admitted on 09/23/2019 with osteomyelitis. Wound cultures from 05/09/2019 admission with E coli and Citrobacter amalonaticus. Pharmacy has been consulted for vancomycin and cefepime dosing.   Afebrile x 24h. WBC 29.7 >> 24.6. Patient with AKI (baseline ~0.7-0.8). Scr stable at 1.4 for two days. Patient has received vancomycin 1.5 g so far this admission.   Plan: Cefepime 2 g q8h  Vancomycin 1250 mg IV Q 24 hrs. Goal AUC 400-550. Expected AUC: 474.2, trough 11.2 SCr used: 1.42  Daily Scr per protocol. Levels as indicated at steady state.   Height: 5' 11"  (180.3 cm) Weight: 240 lb (108.9 kg) IBW/kg (Calculated) : 75.3  Temp (24hrs), Avg:98.2 F (36.8 C), Min:97.9 F (36.6 C), Max:98.5 F (36.9 C)  Recent Labs  Lab 09/23/19 1703 09/23/19 1723 09/23/19 1740 09/23/19 1903 09/23/19 2146 09/23/19 2307 09/24/19 0138  WBC  --  29.7*  --   --   --   --  24.6*  CREATININE  --   --  1.43*  --   --   --  1.42*  LATICACIDVEN 1.9  --   --  2.3* 1.8 1.4 1.3    Estimated Creatinine Clearance: 62.5 mL/min (A) (by C-G formula based on SCr of 1.42 mg/dL (H)).    Allergies  Allergen Reactions  . Iodine Other (See Comments)    Shortness of breath, swelling and hives  . Shrimp [Shellfish Allergy] Other (See Comments)    SWELLING    HIVES    SHORTNESS OF BREATH  . Tetracycline Rash    Antimicrobials this admission: Cefepime 2/22 >> 2/23, 2/23 >> Vancomycin 2/22 >> 2/23, 2/23 >> Azithromycin 2/23 x 1 Ceftriaxone 2/23 x 1  Dose adjustments this admission: n/a  Microbiology results: 2/22 BCx: NG < 12h 2/22 SARS-CoV-2 PCR: positive   Thank you for allowing pharmacy to be a part of this patient's care.  [place] Resident 09/24/2019 7:09 PM

## 2019-09-24 NOTE — Progress Notes (Signed)
PROGRESS NOTE    Colton Lamb  WGN:[id number] DOB: Nov 30, 1950 DOA: 09/23/2019 PCP: [name], MD    Assessment & Plan:   Principal Problem:   Sepsis [facility]) Active Problems:   Hyperlipidemia   CAD S/P percutaneous coronary angioplasty - multiple PCIs   Atrial fibrillation ([place])   GERD   CROHN'S DISEASE-LARGE & SMALL INTESTINE   Long term current use of anticoagulant   Cardiomyopathy, ischemic   OSA (obstructive sleep apnea)   ICD (implantable cardioverter-defibrillator) in place   Diabetes mellitus type 2, uncontrolled, with complications ([place])   Type II diabetes mellitus ([place])   Hypertension, essential   Pressure injury of skin   Decubitus ulcer of sacral region, unstageable ([place])   Chronic osteomyelitis ([place])   HCAP (healthcare-associated pneumonia)   Colostomy in place [facility])    Colton Lamb is a 69 y.o. male with medical history significant of congestive heart failure systolic dysfunction status post defibrillator, coronary artery disease, ischemic cardiomyopathy, atrial fibrillation anticoagulated, hypertension, status post colostomy for protection of multiple infection sacral wound, chronic osteomyelitis, came from nursing home for abnormal labs.  The blood work was obtained on 09/20/2019 with white count of 24,000.   Sepsis, POA --29,000 white count, lactic acid 2.3, hypotension blood pressure 93/71, heart rate 96.  Initially suspected PNA due to CXR finding, however, given no hypoxia, no cough, and COVID-19 pos, source of infection seemed less likely to be pulmonary.  Pt has chronic osteomyelitis and non-healing wounds, which seemed more likely the source of leukocytosis. --Pt was started on vanc/cefepime on presentation.  COVID-19 positive --Pt has no fever, no hypoxia, no cough.  CXR showed only Minimal density at the right lung base.   PLAN: --No need to start Remdesivir or decadron  --No need to treat bacterial PNA  Chronic osteomyelitis Large  presacral decubitus ulcer, stage 4, POA --CT pelvis wo contrast showed "Large presacral decubitus ulcer with progressive bone destruction of the distal sacrum and coccyx consistent with osteomyelitis." --Wound care consulted PLAN: --continue vanc/cefepime (pharmacy to manage) --consider ID consult tomorrow  --consider Surgery consult tomorrow  Status post colostomy For protection multiple infection sacral wound If white count does not improve, check for C. difficile because of very elevated white count  Acute kidney injury, POA BUN 50 creatinine 1.4 on presentation.  Baseline Cr ~1.   --s/p IVF PLAN: --hold diuretics torsemide --Hold further IVF and encourage oral hydration  UTI, ruled out --no urinary symptoms.    Atrial fibrillation on home Xarelto Rate controlled --continue Xarelto --hold metoprolol due to initial hypotension --continue digoxin   Coronary artery disease status post angioplasty --continue aspirin Lipitor niacin  Chronic systolic congestive heart failure  ischemic cardiomyopathy With ICD --LVEF 20-25% with TTE in Nov 2020. --Hold torsemide due to initial hypotension  Essential hypertension Hypotension, POA, resolved  --Pt was on home metop, torsemide, and midodrine  --Hold metop and torsemide due to initial hypotension --continue home midodrine  Diabetes mellitus type 2 Insulin sliding scale per sensitivity factor  Hyperlipidemia Lipitor, niacin  GERD PPI   DVT prophylaxis: HQ:IONGEXB  Code Status: Full code  Family Communication: not today Disposition Plan: back to SNF, unclear when   Subjective and Interval History:  Pt had no complaints, said he felt fine, and was only in the hospital because of some labs.  No fever, dyspnea, cough, chest pain, abdominal pain, N/V/D, dysuria.  Reported reduced appetite.   Objective: Vitals:   09/24/19 0100 09/24/19 0415 09/24/19 0554 09/24/19 1610  BP: 102/66 (!) 97/53 (!) 87/61 (!)  167/135  Pulse: 77 81 69 (!) 103  Resp:  18  18  Temp:  97.9 F (36.6 C)  98.3 F (36.8 C)  TempSrc:      SpO2:  99%  100%  Weight:      Height:        Intake/Output Summary (Last 24 hours) at 09/24/2019 1743 Last data filed at 09/24/2019 1700 Gross per 24 hour  Intake 3740.64 ml  Output --  Net 3740.64 ml   Filed Weights   09/23/19 1629  Weight: 108.9 kg    Examination:   Constitutional: NAD, AAOx3 HEENT: conjunctivae and lids normal, EOMI CV: RRR no M,R,G. Distal pulses +2.  No cyanosis.   RESP: CTA B/L, normal respiratory effort  GI: +BS, NTND Extremities: No effusions, edema, or tenderness in BLE SKIN: warm, dry and intact Neuro: II - XII grossly intact.  Sensation intact Psych: Normal mood and affect.     Data Reviewed: I have personally reviewed following labs and imaging studies  CBC: Recent Labs  Lab 09/23/19 1723 09/24/19 0138  WBC 29.7* 24.6*  NEUTROABS 25.7* 21.2*  HGB 10.9* 9.6*  HCT 35.2* 31.1*  MCV 81.3 81.4  PLT 333 601   Basic Metabolic Panel: Recent Labs  Lab 09/23/19 1740 09/24/19 0138  NA 134* 128*  K 3.5 3.8  CL 85* 84*  CO2 32 31  GLUCOSE 172* 159*  BUN 50* 48*  CREATININE 1.43* 1.42*  CALCIUM 9.0 7.9*  MG 1.8  --   PHOS 2.9  --    GFR: Estimated Creatinine Clearance: 62.5 mL/min (A) (by C-G formula based on SCr of 1.42 mg/dL (H)). Liver Function Tests: Recent Labs  Lab 09/23/19 1740  AST 24  ALT 10  ALKPHOS 137*  BILITOT 0.8  PROT 7.3  ALBUMIN 2.5*   No results for input(s): LIPASE, AMYLASE in the last 168 hours. No results for input(s): AMMONIA in the last 168 hours. Coagulation Profile: Recent Labs  Lab 09/24/19 0138  INR 2.5*   Cardiac Enzymes: No results for input(s): CKTOTAL, CKMB, CKMBINDEX, TROPONINI in the last 168 hours. BNP (last 3 results) No results for input(s): PROBNP in the last 8760 hours. HbA1C: No results for input(s): HGBA1C in the last 72 hours. CBG: Recent Labs  Lab 09/24/19 0828  09/24/19 1133 09/24/19 1618  GLUCAP 126* 123* 163*   Lipid Profile: No results for input(s): CHOL, HDL, LDLCALC, TRIG, CHOLHDL, LDLDIRECT in the last 72 hours. Thyroid Function Tests: No results for input(s): TSH, T4TOTAL, FREET4, T3FREE, THYROIDAB in the last 72 hours. Anemia Panel: No results for input(s): VITAMINB12, FOLATE, FERRITIN, TIBC, IRON, RETICCTPCT in the last 72 hours. Sepsis Labs: Recent Labs  Lab 09/23/19 1903 09/23/19 2146 09/23/19 2307 09/24/19 0138  PROCALCITON  --   --   --  0.80  LATICACIDVEN 2.3* 1.8 1.4 1.3    Recent Results (from the past 240 hour(s))  Culture, blood (routine x 2)     Status: None (Preliminary result)   Collection Time: 09/23/19  8:33 PM   Specimen: BLOOD  Result Value Ref Range Status   Specimen Description BLOOD RIGHT HAND  Final   Special Requests   Final    BOTTLES DRAWN AEROBIC AND ANAEROBIC Blood Culture results may not be optimal due to an inadequate volume of blood received in culture bottles   Culture   Final    NO GROWTH < 12 HOURS Performed at [facility], [address], [place], [place] [address]    Report Status PENDING  Incomplete  Culture, blood (routine x 2)     Status: None (Preliminary result)   Collection Time: 09/23/19  8:33 PM   Specimen: BLOOD  Result Value Ref Range Status   Specimen Description BLOOD RIGHT FOREARM  Final   Special Requests   Final    BOTTLES DRAWN AEROBIC AND ANAEROBIC Blood Culture results may not be optimal due to an inadequate volume of blood received in culture bottles   Culture   Final    NO GROWTH < 12 HOURS Performed at [facility], [place], [place] [address]    Report Status PENDING  Incomplete  SARS CORONAVIRUS 2 (TAT 6-24 HRS) Nasopharyngeal Nasopharyngeal Swab     Status: Abnormal   Collection Time: 09/23/19  9:06 PM   Specimen: Nasopharyngeal Swab  Result Value Ref Range Status   SARS Coronavirus 2 POSITIVE (A) NEGATIVE Final     Comment: RESULT CALLED TO, READ BACK BY AND VERIFIED WITH: Chauncy Passy, RN [facility]) AT [phone] ON 09/24/19 BY C. JESSUP, MT. (NOTE) SARS-CoV-2 target nucleic acids are DETECTED. The SARS-CoV-2 RNA is generally detectable in upper and lower respiratory specimens during the acute phase of infection. Positive results are indicative of the presence of SARS-CoV-2 RNA. Clinical correlation with patient history and other diagnostic information is  necessary to determine patient infection status. Positive results do not rule out bacterial infection or co-infection with other viruses.  The expected result is Negative. Fact Sheet for Patients: SugarRoll.be Fact Sheet for Healthcare Providers: https://www.woods-mathews.com/ This test is not yet approved or cleared by the [place] FDA and  has been authorized for detection and/or diagnosis of SARS-CoV-2 by FDA under an Emergency Use Authorization (EUA). This EUA will remain  in effect (meaning this  test can be used) for the duration of the COVID-19 declaration under Section 564(b)(1) of the Act, 21 U.S.C. section 360bbb-3(b)(1), unless the authorization is terminated or revoked sooner. Performed at [facility], [place] [address] [place], [place] [address]       Radiology Studies: CT PELVIS WO CONTRAST  Result Date: 09/24/2019 CLINICAL DATA:  Infected presacral wound EXAM: CT PELVIS WITHOUT CONTRAST TECHNIQUE: Multidetector CT imaging of the pelvis was performed following the standard protocol without intravenous contrast. Sagittal and coronal MPR images reconstructed from axial data set. COMPARISON:  06/22/2019 FINDINGS: Urinary Tract: Thick walled bladder which could represent chronic cystitis or chronic bladder outlet obstruction. No ureteral dilatation. However, 4 mm calculus is identified within the distal LEFT ureter image 30. Bowel: Prior appendectomy. Double-barrel colostomy LEFT lower quadrant.  Question mild rectal wall thickening. Remaining visualized bowel loops unremarkable. Vascular/Lymphatic: Atherosclerotic calcifications aorta and iliac arteries. No adenopathy. Reproductive: Mild prostatic enlargement. Unremarkable seminal vesicles. Other:  No free intraperitoneal air or fluid. Musculoskeletal: Large presacral decubitus ulcer with exposed distal sacrum in wound. Bone destruction of distal sacrum and coccyx consistent with osteomyelitis progressive since prior study. Mild chronic sclerosis within S1 segment of sacrum unchanged. IMPRESSION: Large presacral decubitus ulcer with progressive bone destruction of the distal sacrum and coccyx consistent with osteomyelitis. Chronic nonspecific sclerosis within S1 segment of sacrum. Prostatic enlargement with mild bladder wall thickening which could represent muscular hypertrophy from chronic bladder outlet obstruction or from chronic cystitis. Nonobstructing 4 mm calculus distal LEFT ureter. Double-barrel colostomy LEFT lower quadrant. Electronically Signed   By: Lavonia Dana M.D.   On: 09/24/2019 09:38   DG Chest  Portable 1 View  Result Date: 09/23/2019 CLINICAL DATA:  68 year old male with leukocytosis EXAM: PORTABLE CHEST 1 VIEW COMPARISON:  Chest radiograph dated 07/15/2019. FINDINGS: Faint right lung base density may represent atelectasis. Trace right pleural effusion is not excluded. Clinical correlation is recommended. No pneumothorax. Borderline cardiomegaly. There is improved cardiac size since the prior radiograph. Left pectoral AICD device. No acute osseous pathology. IMPRESSION: 1. Minimal density at the right lung base may represent atelectasis or trace pleural effusion. Clinical correlation is recommended. 2. Improved cardiac size since the prior radiograph. Electronically Signed   By: Anner Crete M.D.   On: 09/23/2019 19:25     Scheduled Meds: . ascorbic acid  500 mg Oral BID  . aspirin EC  81 mg Oral Daily  . atorvastatin  80  mg Oral Daily  . azithromycin  500 mg Oral Daily  . balsalazide  750 mg Oral TID  . collagenase  1 application Topical Daily  . dexamethasone (DECADRON) injection  6 mg Intravenous Daily  . digoxin  0.125 mg Oral Daily  . finasteride  5 mg Oral Daily  . insulin aspart  5 Units Subcutaneous TID AC  . lactobacillus  1 g Oral TID WC  . midodrine  10 mg Oral TID  . multivitamin-lutein  1 capsule Oral Daily  . niacin  1,000 mg Oral QHS  . pantoprazole  40 mg Oral Daily  . rivaroxaban  20 mg Oral Q lunch  . sertraline  25 mg Oral Daily  . sodium chloride flush  3 mL Intravenous Q12H  . tamsulosin  0.4 mg Oral QPC breakfast   Continuous Infusions: . sodium chloride    . cefTRIAXone (ROCEPHIN)  IV Stopped (09/24/19 1117)     LOS: 1 day     Enzo Bi, MD [facility] If 7PM-7AM, please contact night-coverage 09/24/2019, 5:43 PM

## 2019-09-24 NOTE — Progress Notes (Addendum)
Patients BP is 98/67 MAP 74 and he has Metoprolol 104m and Digoxin 0.125 due for morning dose. HR 80 Messaged Dr. LBillie Ruddyabout same and she said she discontinued the Metoprolol but wanted me to give the Digoxin.

## 2019-09-25 DIAGNOSIS — L89154 Pressure ulcer of sacral region, stage 4: Secondary | ICD-10-CM

## 2019-09-25 DIAGNOSIS — Z95810 Presence of automatic (implantable) cardiac defibrillator: Secondary | ICD-10-CM

## 2019-09-25 DIAGNOSIS — G4733 Obstructive sleep apnea (adult) (pediatric): Secondary | ICD-10-CM

## 2019-09-25 DIAGNOSIS — Z7401 Bed confinement status: Secondary | ICD-10-CM

## 2019-09-25 DIAGNOSIS — Z881 Allergy status to other antibiotic agents status: Secondary | ICD-10-CM

## 2019-09-25 DIAGNOSIS — L89891 Pressure ulcer of other site, stage 1: Secondary | ICD-10-CM

## 2019-09-25 DIAGNOSIS — Z91048 Other nonmedicinal substance allergy status: Secondary | ICD-10-CM

## 2019-09-25 DIAGNOSIS — L89312 Pressure ulcer of right buttock, stage 2: Secondary | ICD-10-CM

## 2019-09-25 DIAGNOSIS — N400 Enlarged prostate without lower urinary tract symptoms: Secondary | ICD-10-CM

## 2019-09-25 DIAGNOSIS — E871 Hypo-osmolality and hyponatremia: Secondary | ICD-10-CM

## 2019-09-25 DIAGNOSIS — U071 COVID-19: Secondary | ICD-10-CM

## 2019-09-25 DIAGNOSIS — D649 Anemia, unspecified: Secondary | ICD-10-CM

## 2019-09-25 DIAGNOSIS — I2510 Atherosclerotic heart disease of native coronary artery without angina pectoris: Secondary | ICD-10-CM

## 2019-09-25 DIAGNOSIS — I480 Paroxysmal atrial fibrillation: Secondary | ICD-10-CM

## 2019-09-25 DIAGNOSIS — Z91013 Allergy to seafood: Secondary | ICD-10-CM

## 2019-09-25 DIAGNOSIS — Z933 Colostomy status: Secondary | ICD-10-CM

## 2019-09-25 DIAGNOSIS — I5022 Chronic systolic (congestive) heart failure: Secondary | ICD-10-CM

## 2019-09-25 DIAGNOSIS — I255 Ischemic cardiomyopathy: Secondary | ICD-10-CM

## 2019-09-25 DIAGNOSIS — I4891 Unspecified atrial fibrillation: Secondary | ICD-10-CM

## 2019-09-25 LAB — BASIC METABOLIC PANEL
Anion gap: 11 (ref 5–15)
BUN: 41 mg/dL — ABNORMAL HIGH (ref 8–23)
CO2: 29 mmol/L (ref 22–32)
Calcium: 8.4 mg/dL — ABNORMAL LOW (ref 8.9–10.3)
Chloride: 96 mmol/L — ABNORMAL LOW (ref 98–111)
Creatinine, Ser: 1.06 mg/dL (ref 0.61–1.24)
GFR calc Af Amer: >60 mL/min (ref >60)
GFR calc non Af Amer: >60 mL/min (ref >60)
Glucose, Bld: 266 mg/dL — ABNORMAL HIGH (ref 70–99)
Potassium: 3.5 mmol/L (ref 3.5–5.1)
Sodium: 136 mmol/L (ref 135–145)

## 2019-09-25 LAB — CBC
HCT: 28.1 % — ABNORMAL LOW (ref 39.0–52.0)
Hemoglobin: 8.8 g/dL — ABNORMAL LOW (ref 13.0–17.0)
MCH: 25.2 pg — ABNORMAL LOW (ref 26.0–34.0)
MCHC: 31.3 g/dL (ref 30.0–36.0)
MCV: 80.5 fL (ref 80.0–100.0)
Platelets: 216 10*3/uL (ref 150–400)
RBC: 3.49 MIL/uL — ABNORMAL LOW (ref 4.22–5.81)
RDW: 18.5 % — ABNORMAL HIGH (ref 11.5–15.5)
WBC: 17.9 10*3/uL — ABNORMAL HIGH (ref 4.0–10.5)
nRBC: 0 % (ref 0.0–0.2)

## 2019-09-25 LAB — CLOSTRIDIUM DIFFICILE BY PCR, REFLEXED: Toxigenic C. Difficile by PCR: POSITIVE — AB

## 2019-09-25 LAB — MAGNESIUM: Magnesium: 2.1 mg/dL (ref 1.7–2.4)

## 2019-09-25 LAB — GLUCOSE, CAPILLARY
Glucose-Capillary: 166 mg/dL — ABNORMAL HIGH (ref 70–99)
Glucose-Capillary: 211 mg/dL — ABNORMAL HIGH (ref 70–99)
Glucose-Capillary: 150 mg/dL — ABNORMAL HIGH (ref 70–99)
Glucose-Capillary: 124 mg/dL — ABNORMAL HIGH (ref 70–99)

## 2019-09-25 LAB — MRSA PCR SCREENING: MRSA by PCR: NEGATIVE

## 2019-09-25 LAB — C DIFFICILE QUICK SCREEN W PCR REFLEX
C Diff antigen: POSITIVE — AB
C Diff toxin: NEGATIVE

## 2019-09-25 MED ORDER — INSULIN ASPART 100 UNIT/ML ~~LOC~~ SOLN
0.0000 [IU] | Freq: Three times a day (TID) | SUBCUTANEOUS | Status: DC
  Administered 2019-09-25: 3 [IU] via SUBCUTANEOUS
  Administered 2019-09-25: 2 [IU] via SUBCUTANEOUS
  Administered 2019-09-25 – 2019-09-28 (×4): 1 [IU] via SUBCUTANEOUS
  Administered 2019-09-28: 12:00:00 2 [IU] via SUBCUTANEOUS
  Administered 2019-09-29: 13:00:00 6 [IU] via SUBCUTANEOUS
  Administered 2019-09-30: 08:00:00 1 [IU] via SUBCUTANEOUS
  Administered 2019-09-30: 12:00:00 3 [IU] via SUBCUTANEOUS
  Administered 2019-09-30: 2 [IU] via SUBCUTANEOUS
  Administered 2019-10-01: 1 [IU] via SUBCUTANEOUS
  Administered 2019-10-01: 12:00:00 2 [IU] via SUBCUTANEOUS
  Administered 2019-10-02: 1 [IU] via SUBCUTANEOUS
  Filled 2019-09-25 (×14): qty 1

## 2019-09-25 MED ORDER — INSULIN ASPART 100 UNIT/ML ~~LOC~~ SOLN
0.0000 [IU] | Freq: Every day | SUBCUTANEOUS | Status: DC
  Administered 2019-09-29: 1 [IU] via SUBCUTANEOUS

## 2019-09-25 MED ORDER — METOPROLOL TARTRATE 25 MG PO TABS
12.5000 mg | ORAL_TABLET | Freq: Two times a day (BID) | ORAL | Status: DC
  Administered 2019-09-25: 12.5 mg via ORAL
  Filled 2019-09-25: qty 1

## 2019-09-25 NOTE — Progress Notes (Signed)
   09/25/19 0829  Vitals  BP 94/69  MAP (mmHg) 75  Pulse Rate 62  ECG Heart Rate 70  Resp 13  Oxygen Therapy  SpO2 100 %  MEWS Score  MEWS Temp 0  MEWS Systolic 1  MEWS Pulse 0  MEWS RR 1  MEWS LOC 0  MEWS Score 2  MEWS Score Color Yellow  Patient's pressure have been soft and new order received for metoprolol 12.5 BID.  Spoke with Dr. Loleta Books to make him aware of current pressure and he still wants patient to receive dose.  Clarise Cruz, BSN

## 2019-09-25 NOTE — TOC Progression Note (Signed)
Transition of Care [facility]) - Progression Note    Patient Details  Name: Colton Lamb MRN: [id number] Date of Birth: 1951/07/16  Transition of Care [facility]) CM/SW Contact  Shelbie Hutching, RN Phone Number: 09/25/2019, 1:34 PM  Clinical Narrative:    RNCM was able to speak with patient's sister [name].  [name] reports that she has paid bills for the patient in the past when he called and asked her too and she has access to his debit card.  Patient's sister reports that the patient probably has funds to pay [facility] [facility].     Expected Discharge Plan: [place] Barriers to Discharge: Continued Medical Work up  Expected Discharge Plan and Services Expected Discharge Plan: [place]   Discharge Planning Services: CM Consult   Living arrangements for the past 2 months: [place]                                       Social Determinants of Health (SDOH) Interventions    Readmission Risk Interventions Readmission Risk Prevention Plan 07/18/2019 07/12/2019 06/14/2019  Transportation Screening Complete Complete Complete  Social Work Consult for [place] - - -  Medication Review Press photographer) Complete Complete Complete  PCP or Specialist appointment within 3-5 days of discharge (No Data) - Complete  HRI or [place] - Not Complete -  HRI or Home Care Consult Pt Refusal Comments return to SNF Return to SNF -  SW Recovery Care/Counseling Consult - Complete Complete  Palliative Care Screening - Not Applicable Not Applicable  Skilled Nursing Facility Complete Complete Complete  Some recent data might be hidden

## 2019-09-25 NOTE — TOC Initial Note (Signed)
Transition of Care [facility]) - Initial/Assessment Note    Patient Details  Name: Colton Lamb MRN: [id number] Date of Birth: 08-01-1951  Transition of Care [facility]) CM/SW Contact:    Shelbie Hutching, RN Phone Number: 09/25/2019, 9:58 AM  Clinical Narrative:                 Patient admitted with sepsis and history of chronic osteomyelitis.  Patient was brought to the emergency room from [facility], patient has also been at [facility] in the past.  Before skilled nursing rehab patient lived at home alone.  RNCM spoke with patient via phone, patient is in the [place] isolation unit, about discharge planning and what the patient wants to see happen when ready for discharge.  Patient verbalizes that he does not know what he wants to do at this point, he reports he does not know how long he will be in the hospital.  This RNCM explained to patient that if he wants to return to [employer] [facility] he owes them money and must pay them before returning.  The other discharge option is to return home with home health, patient reports that he does not know if he can care for himself, he has not been out of the bed recently.  RNCM explained to patient that home health can be arranged and equipment can be ordered for home use, such as hospital bed, walker, 3 in 1 ect..- patient reports that he currently has no equipment at home.  If patient returns home he will need assistance from family or friends.   RNCM will cont to follow patient progress and assist with discharge planning.    Expected Discharge Plan: Skilled Nursing Facility Barriers to Discharge: Continued Medical Work up   Patient Goals and CMS Choice Patient states their goals for this hospitalization and ongoing recovery are:: Patient is unable to verbalize goals      Expected Discharge Plan and Services Expected Discharge Plan: [place]   Discharge Planning Services: CM Consult   Living arrangements for the past 2  months: [place]                                      Prior Living Arrangements/Services Living arrangements for the past 2 months: [place] Lives with:: Facility Resident Patient language and need for interpreter reviewed:: Yes Do you feel safe going back to the place where you live?: Yes      Need for Family Participation in Patient Care: Yes (Comment)(Stage 4 pressure ulcer) Care giver support system in place?: Yes (comment)(sister)   Criminal Activity/Legal Involvement Pertinent to Current Situation/Hospitalization: No - Comment as needed  Activities of Daily Living Home Assistive Devices/Equipment: Eyeglasses, Environmental consultant (specify type), Cane (specify quad or straight) ADL Screening (condition at time of admission) Patient's cognitive ability adequate to safely complete daily activities?: Yes Is the patient deaf or have difficulty hearing?: No Does the patient have difficulty seeing, even when wearing glasses/contacts?: No Does the patient have difficulty concentrating, remembering, or making decisions?: No Patient able to express need for assistance with ADLs?: Yes Does the patient have difficulty dressing or bathing?: Yes Independently performs ADLs?: No Does the patient have difficulty walking or climbing stairs?: Yes Weakness of Legs: Both Weakness of Arms/Hands: Both  Permission Sought/Granted Permission sought to share information with : Case Manager, Family Supports, Chartered certified accountant granted to  share information with : Yes, Verbal Permission Granted  Share Information with NAME: [name]  Permission granted to share info w AGENCY: Investment banker, operational granted to share info w Relationship: Sister     Emotional Assessment   Attitude/Demeanor/Rapport: Avoidant Affect (typically observed): Calm, Flat Orientation: : Oriented to Self, Oriented to Place, Oriented to  Time, Oriented to Situation Alcohol  / Substance Use: Not Applicable Psych Involvement: No (comment)  Admission diagnosis:  Sepsis ([place]) [A41.9] Sepsis without acute organ dysfunction, due to unspecified organism [facility]) [A41.9] Patient Active Problem List   Diagnosis Date Noted  . HCAP (healthcare-associated pneumonia) 09/23/2019  . Colostomy in place [facility]) 09/23/2019  . Bleeding 07/11/2019  . Chronic osteomyelitis ([place]) 06/30/2019  . PICC (peripherally inserted central catheter) flush   . Normocytic anemia 06/21/2019  . Decubitus ulcer of sacral region, unstageable ([place]) 06/19/2019  . Hypotension 06/19/2019  . Anemia   . Acute respiratory failure ([place]) 06/12/2019  . Acute on chronic congestive heart failure ([place])   . Acute blood loss anemia   . Pressure injury of skin 03/17/2019  . Acute renal failure (ARF) ([place])   . Empyema lung ([place])   . Shortness of breath   . Pleural effusion on right   . Sepsis ([place]) 03/04/2019  . Restless legs syndrome (RLS) 06/16/2017  . Hypertension, essential 06/16/2017  . CAD in native artery 04/27/2017  . Hydrocephalus ([place]) 09/08/2016  . Dizziness and giddiness   . Elevated troponin 09/06/2016  . Type II diabetes mellitus ([place])   . Ischemic cardiomyopathy   . Hyponatremia 07/01/2015  . Diabetes mellitus type 2, uncontrolled, with complications ([place]) 23/76/2831  . 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 OSA (obstructive sleep apnea) 12/20/2013  . Morbid obesity ([place]) 12/20/2013  . AKI (acute kidney injury) - Creatinine improved at d/c 12/14/2013  . Elevated TSH - will need f/u TFTs with PCP in 3-4 weeks 12/14/2013  . Junctional bradycardia - resolved 12/14/2013  . Syncope - due to bradycardia in setting of Digoxin Toxicity 12/11/2013  . Acute on chronic HFrEF (heart failure with reduced ejection fraction) ([place]) 12/06/2013  . At risk for sudden cardiac death - on LifeVest 11/30/13  . Acute on chronic combined systolic and diastolic CHF (congestive heart  failure) ([place]) 30-Nov-2013  . Cardiomyopathy, ischemic Nov 30, 2013  . Atrial flutter ([place]) 11/22/2013  . NSTEMI (non-ST elevated myocardial infarction) ([place]) 11/22/2013  . Long term current use of anticoagulant 10/19/2010  . Hyperlipidemia 05/06/2010  . Edema 05/06/2010  . CAD S/P percutaneous coronary angioplasty - multiple PCIs 11/11/2008  . Atrial fibrillation ([place]) 11/11/2008  . GERD 11/11/2008  . [place] INTESTINE 11/11/2008  . COLONIC POLYPS, HX OF 11/11/2008  . [place] ALLERGY 11/11/2008   PCP:  [name], MD Pharmacy:   [id number] [place] [place] [address]Phone: [phone]: [phone] [facility] [place]Phone: [phone]: [phone]    Social Determinants of Health (SDOH) Interventions    Readmission Risk Interventions Readmission Risk Prevention Plan 07/18/2019 07/12/2019 06/14/2019  Transportation Screening Complete Complete Complete  Social Work Consult for [place]- - -  Medication Review (RN Care Manager) Complete Complete Complete  PCP or Specialist appointment within 3-5 days of discharge (No Data) -  Complete  HRI or Home Care Consult - Not Complete -  HRI or Home Care Consult Pt Refusal Comments return to SNF Return to SNF -  SW Recovery Care/Counseling Consult - Complete Complete  Palliative Care Screening - Not Applicable Not Applicable  Skilled Nursing Facility Complete Complete Complete  Some recent data might be hidden

## 2019-09-25 NOTE — Progress Notes (Signed)
Per patient, [name] [phone] is his contact person.  Called and updated.  Clarise Cruz, BSN

## 2019-09-25 NOTE — Progress Notes (Signed)
PROGRESS NOTE    Colton Lamb  CNO:[id number] DOB: 1950-11-12 DOA: 09/23/2019 PCP: Colton Pitch, MD      Brief Narrative:  Colton Lamb is a 69 y.o. 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  Patient [facility]work at [facility] that showed Na 129 and WBC 24K.  In the ER, lactate 2.3 and CXR showed pneumonia and COVID PCR positive.  Started on broad spectrum antibiotics and admitted.         Assessment & Plan:  [id number] hypoxic respiratory failure Presents in the context of the ongoing 2020-2021 COVID-19 pandemic with COVID+ PCR.  Possible atelectasis on CXR, no hypoxia or respiratory symptoms. CRP > 20 likely from bone infection.  Respiratory status stable.  In the absence of significant respiratory disease, the utility of remdesivir is unclear, and possible harm from steroid use given his osteomyelitis likely outweighs benefit.  -Continue clinical monitoring    Possible sepsis Doubt community acquired pneumonia Possible acute on chronic osteomyelitis Leukocytosis noted, minimal tachycardia to meet sepsis criteria.   Surgery/intubation 2 months ago, history Crohn's disease and diabetes, and CHF, increase risk for drug resistant organisms. -Hold antibiotics -Obtain MRSA nares -Consult ID -Obtain urine culture   AKI Hyponatremia Due to dehydration.  Cr 1.4 on admission, up from baseline 1.0.  Today improved with fluids, Na normalized.  Chronic systolic CHF Appears euvolemic -Hold torsemide for now  Atrial flutter, typical HR controlled -Continue Xarelto, digoxin -Hold metoprolol  Coronary disease secondary prevention -Continue aspirin and Xarelto and atorvastatin -Hold  metoprolol  Diabetes GLucose elevated. -Continue mealtime insulin -Add SS crrection insulin  Crohn's disease s/p colostomy placement -Continue balsalazide  Anemia of chronic disease Recent baseline appears to be 7-9 g/dL, stable within reange.  No clinical bleeding. -Avoid excessive phlebotomy  Stage IV sacral pressure injury POA Stage II back pressure injuries, POA Stage I R buttock pressure injury, POA Stage II thigh presure injury, POA Stage II L butock pressure injury, POA -Consult WOC  Mood disorder -Continue sertraline  GERD -Continue pantoprazole  BPH -Continue fFlomax, finasteride  Severe protein calorie malnutrition As evidenced by severe loss of subcutaneous muscle mass, thenar wasting, temporal wasting, buttock ulcer, poor oral intake.        Disposition: The patient was admitted with hyponatremia and leukocytosis, found to have a constellation consistent with early sepsis, suspected source pelvic osteomyelitis.    I will discharge when he has been evaluated by infectious disease, and we have determined an appropriate antibiotic treatment plan.        MDM: The below labs and imaging reports were reviewed and summarized above.  Medication management as above.   DVT prophylaxis: N/A on Xarelto Code Status: FULL Family Communication: Colton Lamb by phone    Consultants:   Infectious disease  Procedures:   2/23 CT pelvis -- osteomyelitis  Antimicrobials:   Vancomycin last dose 2/23 at 2200  Ceftriaxone x1 on 2/23  Cefepime last dose on 2/23 at 2100  Azithromycin last dose on 2/23   Culture data:   2/22 blood culture NGTD  Urine culture not collected           Subjective: Patient tired.  No chest pain, dyspnea, abdominal pain, cough, fever.No diarrhea.  Objective: Vitals:   09/25/19 0000  09/25/19 0810 09/25/19 0829 09/25/19 0846  BP: (!) 106/46 (!) 88/54 94/69   Pulse: 76 64 62   Resp:  14 13 16   Temp:  98.1 F  (36.7 C)    TempSrc:  Oral    SpO2: 100% 100% 100%   Weight:      Height:        Intake/Output Summary (Last 24 hours) at 09/25/2019 1254 Last data filed at 09/25/2019 7106 Gross per 24 hour  Intake 2464.99 ml  Output 1050 ml  Net 1414.99 ml   Filed Weights   09/23/19 1629  Weight: 108.9 kg    Examination: General appearance: elderly, chronically ill appearing adult male, sleeping but rousable, in no acute distress.   HEENT: Anicteric, conjunctiva pale, lids and lashes normal. No nasal deformity, discharge, epistaxis.  Lips dry, oropharynx tacky dry, partially edentulous, oropharynx without lesions, hearing diminished.   Skin: Warm and dry.  Pale.  No jaundice.  No suspicious rashes or lesions. Cardiac: RRR, nl S1-S2, no murmurs appreciated.  Capillary refill is brisk.  JVP not visible.  No LE edema.  Radial pulses 2+ and symmetric. Respiratory: Normal respiratory rate and rhythm.  CTAB without rales or wheezes. Abdomen: Abdomen soft.  No TTP or focal guarding. No ascites, distension, hepatosplenomegaly.   MSK: No deformities or effusions.  Diffuse loss of subcutaneous muscle mass and fat. Neuro: Sleeping but easily arousable.  EOMI, severe generalized weakness. Speech fluent.    Psych: Sluggish, somnolent, attention diminished, affect flat, judgment insight appear impaired    Data Reviewed: I have personally reviewed following labs and imaging studies:  CBC: Recent Labs  Lab 09/23/19 1723 09/24/19 0138 09/25/19 0446  WBC 29.7* 24.6* 17.9*  NEUTROABS 25.7* 21.2*  --   HGB 10.9* 9.6* 8.8*  HCT 35.2* 31.1* 28.1*  MCV 81.3 81.4 80.5  PLT 333 278 269   Basic Metabolic Panel: Recent Labs  Lab 09/23/19 1740 09/24/19 0138 09/25/19 0446  NA 134* 128* 136  K 3.5 3.8 3.5  CL 85* 84* 96*  CO2 32 31 29  GLUCOSE 172* 159* 266*  BUN 50* 48* 41*  CREATININE 1.43* 1.42* 1.06  CALCIUM 9.0 7.9* 8.4*  MG 1.8  --  2.1  PHOS 2.9  --   --    GFR: Estimated Creatinine  Clearance: 83.7 mL/min (by C-G formula based on SCr of 1.06 mg/dL). Liver Function Tests: Recent Labs  Lab 09/23/19 1740  AST 24  ALT 10  ALKPHOS 137*  BILITOT 0.8  PROT 7.3  ALBUMIN 2.5*   No results for input(s): LIPASE, AMYLASE in the last 168 hours. No results for input(s): AMMONIA in the last 168 hours. Coagulation Profile: Recent Labs  Lab 09/24/19 0138  INR 2.5*   Cardiac Enzymes: No results for input(s): CKTOTAL, CKMB, CKMBINDEX, TROPONINI in the last 168 hours. BNP (last 3 results) No results for input(s): PROBNP in the last 8760 hours. HbA1C: No results for input(s): HGBA1C in the last 72 hours. CBG: Recent Labs  Lab 09/24/19 1133 09/24/19 1618 09/24/19 2035 09/25/19 0726 09/25/19 1107  GLUCAP 123* 163* 171* 166* 211*   Lipid Profile: No results for input(s): CHOL, HDL, LDLCALC, TRIG, CHOLHDL, LDLDIRECT in the last 72 hours. Thyroid Function Tests: No results for input(s): TSH, T4TOTAL, FREET4, T3FREE, THYROIDAB in the last 72 hours. Anemia Panel: No results for input(s): VITAMINB12, FOLATE, FERRITIN, TIBC, IRON, RETICCTPCT in the last 72 hours. Urine analysis:    Component Value Date/Time   COLORURINE AMBER (A)  09/23/2019 1817   APPEARANCEUR TURBID (A) 09/23/2019 1817   LABSPEC 1.013 09/23/2019 1817   PHURINE 5.0 09/23/2019 1817   GLUCOSEU NEGATIVE 09/23/2019 1817   HGBUR NEGATIVE 09/23/2019 1817   BILIRUBINUR NEGATIVE 09/23/2019 1817   KETONESUR NEGATIVE 09/23/2019 1817   PROTEINUR NEGATIVE 09/23/2019 1817   UROBILINOGEN 1.0 11/27/2013 1017   NITRITE NEGATIVE 09/23/2019 1817   LEUKOCYTESUR LARGE (A) 09/23/2019 1817   Sepsis Labs: @LABRCNTIP (procalcitonin:4,lacticacidven:4)  ) Recent Results (from the past 240 hour(s))  Culture, blood (routine x 2)     Status: None (Preliminary result)   Collection Time: 09/23/19  8:33 PM   Specimen: BLOOD  Result Value Ref Range Status   Specimen Description BLOOD RIGHT HAND  Final   Special Requests    Final    BOTTLES DRAWN AEROBIC AND ANAEROBIC Blood Culture results may not be optimal due to an inadequate volume of blood received in culture bottles   Culture   Final    NO GROWTH 2 DAYS Performed at [facility], [address] [place], [place] [address]    Report Status PENDING  Incomplete  Culture, blood (routine x 2)     Status: None (Preliminary result)   Collection Time: 09/23/19  8:33 PM   Specimen: BLOOD  Result Value Ref Range Status   Specimen Description BLOOD RIGHT FOREARM  Final   Special Requests   Final    BOTTLES DRAWN AEROBIC AND ANAEROBIC Blood Culture results may not be optimal due to an inadequate volume of blood received in culture bottles   Culture  Setup Time PENDING  Incomplete   Culture   Final    NO GROWTH 2 DAYS Performed at [facility], [address] [place], [place] [address]    Report Status PENDING  Incomplete  SARS CORONAVIRUS 2 (TAT 6-24 HRS) Nasopharyngeal Nasopharyngeal Swab     Status: Abnormal   Collection Time: 09/23/19  9:06 PM   Specimen: Nasopharyngeal Swab  Result Value Ref Range Status   SARS Coronavirus 2 POSITIVE (A) NEGATIVE Final    Comment: RESULT CALLED TO, READ BACK BY AND VERIFIED WITH: Chauncy Passy, RN [facility]) AT [phone] ON 09/24/19 BY C. JESSUP, MT. (NOTE) SARS-CoV-2 target nucleic acids are DETECTED. The SARS-CoV-2 RNA is generally detectable in upper and lower respiratory specimens during the acute phase of infection. Positive results are indicative of the presence of SARS-CoV-2 RNA. Clinical correlation with patient history and other diagnostic information is  necessary to determine patient infection status. Positive results do not rule out bacterial infection or co-infection with other viruses.  The expected result is Negative. Fact Sheet for Patients: SugarRoll.be Fact Sheet for Healthcare Providers: https://www.woods-mathews.com/ This test is not yet approved  or cleared by the [place] FDA and  has been authorized for detection and/or diagnosis of SARS-CoV-2 by FDA under an Emergency Use Authorization (EUA). This EUA will remain  in effect (meaning this  test can be used) for the duration of the COVID-19 declaration under Section 564(b)(1) of the Act, 21 U.S.C. section 360bbb-3(b)(1), unless the authorization is terminated or revoked sooner. Performed at [facility], [place] [address] [place], [place] [address]   MRSA PCR Screening     Status: None   Collection Time: 09/25/19  8:36 AM   Specimen: Nasopharyngeal  Result Value Ref Range Status   MRSA by PCR NEGATIVE NEGATIVE Final    Comment:        The GeneXpert MRSA Assay (FDA approved for NASAL  specimens only), is one component of a comprehensive MRSA colonization surveillance program. It is not intended to diagnose MRSA infection nor to guide or monitor treatment for MRSA infections. Performed at [facility], [address] [place], [place] [address]          Radiology Studies: CT PELVIS WO CONTRAST  Result Date: 09/24/2019 CLINICAL DATA:  Infected presacral wound EXAM: CT PELVIS WITHOUT CONTRAST TECHNIQUE: Multidetector CT imaging of the pelvis was performed following the standard protocol without intravenous contrast. Sagittal and coronal MPR images reconstructed from axial data set. COMPARISON:  06/22/2019 FINDINGS: Urinary Tract: Thick walled bladder which could represent chronic cystitis or chronic bladder outlet obstruction. No ureteral dilatation. However, 4 mm calculus is identified within the distal LEFT ureter image 30. Bowel: Prior appendectomy. Double-barrel colostomy LEFT lower quadrant. Question mild rectal wall thickening. Remaining visualized bowel loops unremarkable. Vascular/Lymphatic: Atherosclerotic calcifications aorta and iliac arteries. No adenopathy. Reproductive: Mild prostatic enlargement. Unremarkable seminal vesicles. Other:  No free  intraperitoneal air or fluid. Musculoskeletal: Large presacral decubitus ulcer with exposed distal sacrum in wound. Bone destruction of distal sacrum and coccyx consistent with osteomyelitis progressive since prior study. Mild chronic sclerosis within S1 segment of sacrum unchanged. IMPRESSION: Large presacral decubitus ulcer with progressive bone destruction of the distal sacrum and coccyx consistent with osteomyelitis. Chronic nonspecific sclerosis within S1 segment of sacrum. Prostatic enlargement with mild bladder wall thickening which could represent muscular hypertrophy from chronic bladder outlet obstruction or from chronic cystitis. Nonobstructing 4 mm calculus distal LEFT ureter. Double-barrel colostomy LEFT lower quadrant. Electronically Signed   By: Lavonia Dana M.D.   On: 09/24/2019 09:38   DG Chest Portable 1 View  Result Date: 09/23/2019 CLINICAL DATA:  70 year old male with leukocytosis EXAM: PORTABLE CHEST 1 VIEW COMPARISON:  Chest radiograph dated 07/15/2019. FINDINGS: Faint right lung base density may represent atelectasis. Trace right pleural effusion is not excluded. Clinical correlation is recommended. No pneumothorax. Borderline cardiomegaly. There is improved cardiac size since the prior radiograph. Left pectoral AICD device. No acute osseous pathology. IMPRESSION: 1. Minimal density at the right lung base may represent atelectasis or trace pleural effusion. Clinical correlation is recommended. 2. Improved cardiac size since the prior radiograph. Electronically Signed   By: Anner Crete M.D.   On: 09/23/2019 19:25        Scheduled Meds: . ascorbic acid  500 mg Oral BID  . aspirin EC  81 mg Oral Daily  . atorvastatin  80 mg Oral Daily  . balsalazide  750 mg Oral TID  . collagenase  1 application Topical Daily  . digoxin  0.125 mg Oral Daily  . finasteride  5 mg Oral Daily  . insulin aspart  0-5 Units Subcutaneous QHS  . insulin aspart  0-9 Units Subcutaneous TID WC  .  insulin aspart  5 Units Subcutaneous TID AC  . lactobacillus  1 g Oral TID WC  . midodrine  10 mg Oral TID  . multivitamin-lutein  1 capsule Oral Daily  . niacin  1,000 mg Oral QHS  . pantoprazole  40 mg Oral Daily  . rivaroxaban  20 mg Oral Q lunch  . sertraline  25 mg Oral Daily  . sodium chloride flush  3 mL Intravenous Q12H  . tamsulosin  0.4 mg Oral QPC breakfast   Continuous Infusions: . sodium chloride Stopped (09/25/19 0724)     LOS: 2 days    Time spent: 25 minutes    Edwin Dada, MD [facility]  09/25/2019, 12:54 PM     Please page though [place] or Epic secure chat:  For [employer], Adult nurse

## 2019-09-25 NOTE — Consult Note (Signed)
NAME: Colton Lamb  DOB: [birth year]  MRN: [id number]  Date/Time: 09/25/2019 11:40 AM  REQUESTING PROVIDER: Dr. Loleta Books Subjective:  REASON FOR CONSULT: sacral decubitus ? Colton Lamb is a 69 y.o. male with a history of coronary artery disease, ischemic cardiomyopathy, paroxysmal A. fib, defibrillator in place, OSA, congestive heart failure, stage IV sacral decubitus, diverting colostomy Presents from [facility] because of high white count and low sodium.  He had a WBC of 24.7 on 09/20/2019 and the sodium was 129.  Patient did not have any complaints and he was alert and oriented with no shortness of breath. In the ED the sodium was 134, potassium 3.5, white count of 29, hemoglobin of 10.9 and platelet of 333.  Lactic acid was 2.3.  Chest x-ray was done it was suspicious for a right lower lobe infiltrate.  Urine analysis showed white count of more than 50 .  He was started empirically on vancomycin and cefepime and was given IV fluids.  He also had a CT pelvis which showed a large presacral decubitus ulcer with progressive bone destruction of the distal sacrum and coccyx consistent with osteomyelitis.  I am asked to see the patient for the same. Pt also tested positive for COVID but he is totally asymptomatic  Known to me from prior admission.  He was hospitalized in November December between 06/12/2019 until 07/09/2019 for CHF and A. [facility] from [facility].  During that hospitalization he was noted to have stage IV sacral decubitus and a wound VAC was placed.  He also got a diverting colostomy on 07/03/2019 to prevent stool contamination and was discharged on IV Zosyn to complete 4 weeks on 07/24/2019. He was admitted on 07/11/2019  bleeding from the colostomy site.  He was evaluated by surgery.  The bleeding was exacerbated by Xarelto use.  Once that the Xarelto was on hold the bleeding stopped.  And his colostomy was functioning well and he was discharged on 12/17.  Has been bed  bound for the past 6-8 months   Past Medical History:  Diagnosis Date  . Atrial flutter ([place])    a. s/p Cardioversion 11/22/13, on amiodarone and Xarelto.  . CHF (congestive heart failure) ([place])   . Chronic osteomyelitis ([place]) 06/30/2019   s/p colostomy  . Chronic systolic heart failure ([place])    a. 10/2013 EF 20-25%, grade III DD, RV mildly dilated and sys fx mild/mod reduced;  b. 01/2014 Echo: EF 30-35%, gr3 DD, mod dil LA. c. 07/2019 EF 20-25%  . Coronary artery disease    a. s/p MI 2007/2015;  b. s/p prior PCI to the LAD/LCX/PDA/PL;  c. 2008: s/p Cypher DES to the OM.  Marland Kitchen Crohn's ileocolitis ([place])   . GERD (gastroesophageal reflux disease)   . Hx of adenomatous colonic polyps 11/2003  . Hyperlipidemia   . Hypertension   . Hypotension   . Ischemic cardiomyopathy    s. 01/2014 s/p MDT [id number] [name] XT DR single lead AICD.  Marland Kitchen Obesity   . Paroxysmal atrial fibrillation (H[facility])    a. CHA2DS2VASc = (CHF, HTN, agex1, DM)  . Sleep apnea   . Syncope    a.  11/2013 in setting of volume depletion and bradycardia due to dig toxicity   . Type II diabetes mellitus ([place])     Past Surgical History:  Procedure Laterality Date  . ATRIAL FLUTTER ABLATION N/A 04/16/2014   Procedure: ATRIAL FLUTTER ABLATION;  Surgeon: Evans Lance, MD;  Location[facility] CATH LAB;  Service: Cardiovascular;  Laterality: N/A;  . CARDIAC CATHETERIZATION  10/2013  . CARDIAC DEFIBRILLATOR PLACEMENT  04/16/2014   Medtronic Evira device  . CARDIAC ELECTROPHYSIOLOGY STUDY AND ABLATION  04/16/2014   atrial flutter ablation  . CARDIOVERSION N/A 03/05/2014   Procedure: CARDIOVERSION;  Surgeon: Jolaine Artist, MD;  Location[facility] ENDOSCOPY;  Service: Cardiovascular;  Laterality: N/A;  . CATARACT EXTRACTION W/PHACO Right 01/04/2017   Procedure: CATARACT EXTRACTION PHACO AND INTRAOCULAR LENS PLACEMENT ([place])  Right Diabetic Complicated;  Surgeon: Leandrew Koyanagi, MD;  Location: [facility];  Service: Ophthalmology;  Laterality:  Right;  Diabetic  . CATARACT EXTRACTION W/PHACO Left 02/08/2017   Procedure: CATARACT EXTRACTION PHACO AND INTRAOCULAR LENS PLACEMENT ([place]) left diabetic;  Surgeon: Leandrew Koyanagi, MD;  Location: [facility];  Service: Ophthalmology;  Laterality: Left;  Diabetic - oral meds sleep apnea  . CORONARY ANGIOPLASTY WITH STENT PLACEMENT  2007; 2008 X 2   "1+1 ~ 1"  . FOOT SURGERY Left    bone spur  . HYDROCELE EXCISION Bilateral   . 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 IMPLANTABLE CARDIOVERTER DEFIBRILLATOR IMPLANT N/A 04/16/2014   Procedure: IMPLANTABLE CARDIOVERTER DEFIBRILLATOR IMPLANT;  Surgeon: Evans Lance, MD;  Location: [facility] CATH LAB;  Service: Cardiovascular;  Laterality: N/A;  . LEFT HEART CATHETERIZATION WITH CORONARY ANGIOGRAM N/A 11/22/2013   Procedure: LEFT HEART CATHETERIZATION WITH CORONARY ANGIOGRAM;  Surgeon: Sinclair Grooms, MD;  Location: [facility] CATH LAB;  Service: Cardiovascular;  Laterality: N/A;  . TRANSVERSE LOOP COLOSTOMY N/A 07/03/2019   Procedure: TRANSVERSE LOOP COLOSTOMY;  Surgeon: Ronny Bacon, MD;  Location: [facility] ORS;  Service: General;  Laterality: N/A;    Social History   Socioeconomic History  . Marital status: Widowed    Spouse name: Not on file  . Number of children: 1  . Years of education: Not on file  . Highest education level: Not on file  Occupational History  . Occupation: retired    Fish farm manager: RETIRED  Tobacco Use  . Smoking status: Never Smoker  . Smokeless tobacco: Never Used  Substance and Sexual Activity  . Alcohol use: No  . Drug use: No  . Sexual activity: Never  Other Topics Concern  . Not on file  Social History Narrative  . Not on file   Social Determinants of Health   Financial Resource Strain: Low Risk   . Difficulty of Paying Living Expenses: Not hard at all  Food Insecurity: No Food Insecurity  . Worried About Charity fundraiser in the Last Year: Never true  . Ran Out of Food  in the Last Year: Never true  Transportation Needs: No Transportation Needs  . Lack of Transportation (Medical): No  . Lack of Transportation (Non-Medical): No  Physical Activity: Inactive  . Days of Exercise per Week: 0 days  . Minutes of Exercise per Session: 0 min  Stress:   . Feeling of Stress : Not on file  Social Connections: Moderately Isolated  . Frequency of Communication with Friends and Family: Never  . Frequency of Social Gatherings with Friends and Family: Never  . Attends Religious Services: Never  . Active Member of Clubs or Organizations: Yes  . Attends Archivist Meetings: 1 to 4 times per year  . Marital Status: Never married  Intimate Partner Violence: Unknown  . Fear of Current or Ex-Partner: No  . Emotionally Abused: Not on file  . Physically Abused: No  . Sexually Abused: No  Family History  Problem Relation Age of Onset  . Breast cancer Mother   . Heart disease Father   . Heart attack Father   . Colon cancer Neg Hx    Allergies  Allergen Reactions  . Iodine Other (See Comments)    Shortness of breath, swelling and hives  . Shrimp [Shellfish Allergy] Other (See Comments)    SWELLING    HIVES    SHORTNESS OF BREATH  . Tetracycline Rash    ? Current Facility-Administered Medications  Medication Dose Route Frequency Provider Last Rate Last Admin  . 0.9 %  sodium chloride infusion  250 mL Intravenous PRN Cristescu, Linard Millers, MD   Stopped at 09/25/19 0724  . acetaminophen (TYLENOL) tablet 650 mg  650 mg Oral Q6H PRN Cristescu, Mircea G, MD       Or  . acetaminophen (TYLENOL) suppository 650 mg  650 mg Rectal Q6H PRN Cristescu, Mircea G, MD      . albuterol (PROVENTIL) (2.5 MG/3ML) 0.083% nebulizer solution 2.5 mg  2.5 mg Inhalation Q6H PRN Cristescu, Mircea G, MD      . ascorbic acid (VITAMIN C) tablet 500 mg  500 mg Oral BID Cristescu, Linard Millers, MD   500 mg at 09/25/19 0831  . aspirin EC tablet 81 mg  81 mg Oral Daily Cristescu, Linard Millers,  MD   81 mg at 09/25/19 0831  . atorvastatin (LIPITOR) tablet 80 mg  80 mg Oral Daily Cristescu, Mircea G, MD   80 mg at 09/24/19 1628  . balsalazide (COLAZAL) capsule 750 mg  750 mg Oral TID Cristescu, Linard Millers, MD   750 mg at 09/25/19 0832  . bisacodyl (DULCOLAX) EC tablet 5 mg  5 mg Oral Daily PRN Cristescu, Mircea G, MD      . collagenase (SANTYL) ointment 1 application  1 application Topical Daily Cristescu, Linard Millers, MD   1 application at 99/24/26 1131  . digoxin (LANOXIN) tablet 0.125 mg  0.125 mg Oral Daily Cristescu, Mircea G, MD   0.125 mg at 09/25/19 0842  . finasteride (PROSCAR) tablet 5 mg  5 mg Oral Daily Cristescu, Linard Millers, MD   5 mg at 09/25/19 0832  . HYDROcodone-acetaminophen (NORCO/VICODIN) 5-325 MG per tablet 1 tablet  1 tablet Oral Q6H PRN Cristescu, Mircea G, MD      . insulin aspart (novoLOG) injection 0-5 Units  0-5 Units Subcutaneous QHS Danford, Christopher P, MD      . insulin aspart (novoLOG) injection 0-9 Units  0-9 Units Subcutaneous TID WC Danford, Suann Larry, MD   3 Units at 09/25/19 1131  . insulin aspart (novoLOG) injection 5 Units  5 Units Subcutaneous TID AC Cristescu, Linard Millers, MD   5 Units at 09/25/19 1131  . lactobacillus (FLORANEX/LACTINEX) granules 1 g  1 g Oral TID WC Cristescu, Mircea G, MD   1 g at 09/25/19 1131  . magnesium citrate solution 1 Bottle  1 Bottle Oral Once PRN Cristescu, Mircea G, MD      . metoprolol tartrate (LOPRESSOR) tablet 12.5 mg  12.5 mg Oral BID Edwin Dada, MD   12.5 mg at 09/25/19 0837  . midodrine (PROAMATINE) tablet 10 mg  10 mg Oral TID Cristescu, Linard Millers, MD   10 mg at 09/25/19 0832  . multivitamin-lutein (OCUVITE-LUTEIN) capsule 1 capsule  1 capsule Oral Daily Cristescu, Linard Millers, MD   1 capsule at 09/25/19 0842  . niacin (NIASPAN) CR tablet 1,000 mg  1,000 mg Oral QHS Cristescu,  Linard Millers, MD   1,000 mg at 09/24/19 2105  . ondansetron (ZOFRAN) tablet 4 mg  4 mg Oral Q6H PRN Cristescu, Mircea G, MD       Or  .  ondansetron (ZOFRAN) injection 4 mg  4 mg Intravenous Q6H PRN Cristescu, Mircea G, MD      . pantoprazole (PROTONIX) EC tablet 40 mg  40 mg Oral Daily Cristescu, Linard Millers, MD   40 mg at 09/25/19 0832  . polyethylene glycol (MIRALAX / GLYCOLAX) packet 17 g  17 g Oral Daily PRN Cristescu, Mircea G, MD      . rivaroxaban (XARELTO) tablet 20 mg  20 mg Oral Q lunch Cristescu, Mircea G, MD   20 mg at 09/25/19 1133  . sertraline (ZOLOFT) tablet 25 mg  25 mg Oral Daily Cristescu, Linard Millers, MD   25 mg at 09/25/19 0832  . sodium chloride flush (NS) 0.9 % injection 3 mL  3 mL Intravenous Q12H Cristescu, Linard Millers, MD   3 mL at 09/25/19 0904  . sodium chloride flush (NS) 0.9 % injection 3 mL  3 mL Intravenous PRN Cristescu, Mircea G, MD      . tamsulosin (FLOMAX) capsule 0.4 mg  0.4 mg Oral QPC breakfast Cristescu, Mircea G, MD   0.4 mg at 09/25/19 2841     Abtx:  Anti-infectives (From admission, onward)   Start     Dose/Rate Route Frequency Ordered Stop   09/24/19 2200  vancomycin (VANCOREADY) IVPB 1250 mg/250 mL  Status:  Discontinued     1,250 mg 166.7 mL/hr over 90 Minutes Intravenous Every 24 hours 09/23/19 2134 09/24/19 0937   09/24/19 2100  ceFEPIme (MAXIPIME) 2 g in sodium chloride 0.9 % 100 mL IVPB  Status:  Discontinued     2 g 200 mL/hr over 30 Minutes Intravenous Every 8 hours 09/24/19 1926 09/25/19 0744   09/24/19 2100  vancomycin (VANCOREADY) IVPB 1250 mg/250 mL  Status:  Discontinued     1,250 mg 166.7 mL/hr over 90 Minutes Intravenous Every 24 hours 09/24/19 1926 09/25/19 0744   09/24/19 1015  cefTRIAXone (ROCEPHIN) 1 g in sodium chloride 0.9 % 100 mL IVPB  Status:  Discontinued     1 g 200 mL/hr over 30 Minutes Intravenous Every 24 hours 09/24/19 0937 09/24/19 1905   09/24/19 1000  azithromycin (ZITHROMAX) tablet 500 mg  Status:  Discontinued     500 mg Oral Daily 09/24/19 0937 09/24/19 1905   09/24/19 0500  ceFEPIme (MAXIPIME) 2 g in sodium chloride 0.9 % 100 mL IVPB  Status:   Discontinued     2 g 200 mL/hr over 30 Minutes Intravenous Every 8 hours 09/23/19 2128 09/24/19 0937   09/23/19 2315  vancomycin (VANCOREADY) IVPB 500 mg/100 mL     500 mg 100 mL/hr over 60 Minutes Intravenous  Once 09/23/19 2306 09/24/19 0158   09/23/19 2300  vancomycin (VANCOREADY) IVPB 1500 mg/300 mL  Status:  Discontinued     1,500 mg 150 mL/hr over 120 Minutes Intravenous  Once 09/23/19 1956 09/23/19 2306   09/23/19 2000  ceFEPIme (MAXIPIME) 2 g in sodium chloride 0.9 % 100 mL IVPB     2 g 200 mL/hr over 30 Minutes Intravenous  Once 09/23/19 1952 09/23/19 2146   09/23/19 2000  vancomycin (VANCOCIN) IVPB 1000 mg/200 mL premix     1,000 mg 200 mL/hr over 60 Minutes Intravenous  Once 09/23/19 1952 09/23/19 2315      REVIEW OF SYSTEMS:  Const:  negative fever, negative chills, has weight loss Eyes: negative diplopia or visual changes, negative eye pain ENT: negative coryza, negative sore throat Resp: negative cough, hemoptysis, dyspnea Cards: negative for chest pain, palpitations, lower extremity edema GU: negative for frequency, dysuria and hematuria GI: has diverting colostomy- loose stool Skin: negative for rash and pruritus Heme: negative for easy bruising and gum/nose bleeding MS: generalized weakness Neurolo:no weakness on one side Psych: negative for feelings of anxiety, depression  Endocrine: negative for thyroid, diabetes Allergy/Immunology-as above Objective:  VITALS:  BP 94/69   Pulse 62   Temp 98.1 F (36.7 C) (Oral)   Resp 16   Ht 5' 11"  (1.803 m)   Wt 108.9 kg   SpO2 100%   BMI 33.47 kg/m  PHYSICAL EXAM:  General: Alert, cooperative, no distress, appears stated age.  Head: Normocephalic, without obvious abnormality, atraumatic. Eyes: Conjunctivae clear, anicteric sclerae. Pupils are equal ENT Nares normal. No drainage or sinus tenderness.  Neck: Supple, symmetrical, no adenopathy, thyroid: non tender no carotid bruit and no JVD. Back: No CVA  tenderness. Lungs: Clear to auscultation bilaterally. No Wheezing or Rhonchi. No rales. Heart: irregular Device under the chest wall-  Abdomen: Soft,colostomy in place  Sacral decubitus- stage IV bone exposed Clean    Red beefy muscle tissue      Stg 1 pressure area over rt scapula, Stage II pressure area over rt gluteal fold  Extremities: atraumatic, no cyanosis. No edema. No clubbing Skin: No rashes or lesions. Or bruising Lymph: Cervical, supraclavicular normal. Neurologic: Grossly non-focal Pertinent Labs Lab Results CBC    Component Value Date/Time   WBC 17.9 (H) 09/25/2019 0446   RBC 3.49 (L) 09/25/2019 0446   HGB 8.8 (L) 09/25/2019 0446   HGB 13.9 11/22/2013 0301   HCT 28.1 (L) 09/25/2019 0446   HCT 43.3 11/22/2013 0301   PLT 216 09/25/2019 0446   PLT 350 11/22/2013 0301   MCV 80.5 09/25/2019 0446   MCV 81 11/22/2013 0301   MCH 25.2 (L) 09/25/2019 0446   MCHC 31.3 09/25/2019 0446   RDW 18.5 (H) 09/25/2019 0446   RDW 16.1 (H) 11/22/2013 0301   LYMPHSABS 1.9 09/24/2019 0138   MONOABS 0.9 09/24/2019 0138   EOSABS 0.2 09/24/2019 0138   BASOSABS 0.1 09/24/2019 0138    CMP Latest Ref Rng & Units 09/25/2019 09/24/2019 09/23/2019  Glucose 70 - 99 mg/dL 266(H) 159(H) 172(H)  BUN 8 - 23 mg/dL 41(H) 48(H) 50(H)  Creatinine 0.61 - 1.24 mg/dL 1.06 1.42(H) 1.43(H)  Sodium 135 - 145 mmol/L 136 128(L) 134(L)  Potassium 3.5 - 5.1 mmol/L 3.5 3.8 3.5  Chloride 98 - 111 mmol/L 96(L) 84(L) 85(L)  CO2 22 - 32 mmol/L 29 31 32  Calcium 8.9 - 10.3 mg/dL 8.4(L) 7.9(L) 9.0  Total Protein 6.5 - 8.1 g/dL - - 7.3  Total Bilirubin 0.3 - 1.2 mg/dL - - 0.8  Alkaline Phos 38 - 126 U/L - - 137(H)  AST 15 - 41 U/L - - 24  ALT 0 - 44 U/L - - 10      Microbiology: Recent Results (from the past 240 hour(s))  Culture, blood (routine x 2)     Status: None (Preliminary result)   Collection Time: 09/23/19  8:33 PM   Specimen: BLOOD  Result Value Ref Range Status   Specimen  Description BLOOD RIGHT HAND  Final   Special Requests   Final    BOTTLES DRAWN AEROBIC AND ANAEROBIC Blood Culture results may not be optimal  due to an inadequate volume of blood received in culture bottles   Culture   Final    NO GROWTH 2 DAYS Performed at [facility], [address] [place], [place] [address]    Report Status PENDING  Incomplete  Culture, blood (routine x 2)     Status: None (Preliminary result)   Collection Time: 09/23/19  8:33 PM   Specimen: BLOOD  Result Value Ref Range Status   Specimen Description BLOOD RIGHT FOREARM  Final   Special Requests   Final    BOTTLES DRAWN AEROBIC AND ANAEROBIC Blood Culture results may not be optimal due to an inadequate volume of blood received in culture bottles   Culture  Setup Time PENDING  Incomplete   Culture   Final    NO GROWTH 2 DAYS Performed at [facility], [address], [place], [place] [address]    Report Status PENDING  Incomplete  SARS CORONAVIRUS 2 (TAT 6-24 HRS) Nasopharyngeal Nasopharyngeal Swab     Status: Abnormal   Collection Time: 09/23/19  9:06 PM   Specimen: Nasopharyngeal Swab  Result Value Ref Range Status   SARS Coronavirus 2 POSITIVE (A) NEGATIVE Final    Comment: RESULT CALLED TO, READ BACK BY AND VERIFIED WITH: Chauncy Passy, RN [facility]) AT [phone] ON 09/24/19 BY C. JESSUP, MT. (NOTE) SARS-CoV-2 target nucleic acids are DETECTED. The SARS-CoV-2 RNA is generally detectable in upper and lower respiratory specimens during the acute phase of infection. Positive results are indicative of the presence of SARS-CoV-2 RNA. Clinical correlation with patient history and other diagnostic information is  necessary to determine patient infection status. Positive results do not rule out bacterial infection or co-infection with other viruses.  The expected result is Negative. Fact Sheet for Patients: SugarRoll.be Fact Sheet for Healthcare  Providers: https://www.woods-mathews.com/ This test is not yet approved or cleared by the [place] FDA and  has been authorized for detection and/or diagnosis of SARS-CoV-2 by FDA under an Emergency Use Authorization (EUA). This EUA will remain  in effect (meaning this  test can be used) for the duration of the COVID-19 declaration under Section 564(b)(1) of the Act, 21 U.S.C. section 360bbb-3(b)(1), unless the authorization is terminated or revoked sooner. Performed at [facility], [place] [address], [place], [place] [address]   MRSA PCR Screening     Status: None   Collection Time: 09/25/19  8:36 AM   Specimen: Nasopharyngeal  Result Value Ref Range Status   MRSA by PCR NEGATIVE NEGATIVE Final    Comment:        The GeneXpert MRSA Assay (FDA approved for NASAL specimens only), is one component of a comprehensive MRSA colonization surveillance program. It is not intended to diagnose MRSA infection nor to guide or monitor treatment for MRSA infections. Performed at [facility], [address] [place], [place] [address]     IMAGING RESULTS:  I have personally reviewed the films ? Impression/Recommendation ? ?69 yr male with stage IV sacral decubitus , anemia, bed bound admitted for abnormal labs  Leucocytosis- Pt does not look septic , he has a few reasons to have leucocytosis Has a sacral stage IV decubitus but clinically the wound does not look infected and he was recently treated with 4 weeks of IV antibiotics for chronic osteomyelitis. He also was given a diverting colostomy to help with wound healing but the wound has progressed and the bone is projecting. Would get surgical /consult to see whether they can  do deep biopsy and culture and also plastic surgery opinion .Marland Kitchen  R/o Cdiff - has received multiple antibiotics in the past Check stool for cdiff  Agree with holding antibiotics  Covid 19 incidentally positive- HE is asymptomatic,  chest clear, not hypoxic and no evidence of pneumonia on the CXR Agree with not giving remdisivir or steroids   Hyponatremia - due to dehydration- has improved with fluids  Anemia- chronic- has received PRBC in the past  Afib- controlled- on digoxin On xarelto- in the past he has had bleeding from colostomy site as well into the sacral decub  Ischemic cardiomyopathy- EF 20-25% in Nov 2020 Has AICD  BPH on finasteride and flomax- recommend bladder scan to look for residue ? Multiple hospitalizations in the past 6 months Unclear why he is not ambulatory and bed bound other than generalized deconditioning ___________________________________________________ Discussed with his nurse, requesting provider

## 2019-09-26 LAB — URINE CULTURE: Culture: NO GROWTH

## 2019-09-26 LAB — BLOOD CULTURE ID PANEL (REFLEXED)

## 2019-09-26 LAB — CBC
HCT: 31.7 % — ABNORMAL LOW (ref 39.0–52.0)
Hemoglobin: 9.6 g/dL — ABNORMAL LOW (ref 13.0–17.0)
MCH: 24.7 pg — ABNORMAL LOW (ref 26.0–34.0)
MCHC: 30.3 g/dL (ref 30.0–36.0)
MCV: 81.7 fL (ref 80.0–100.0)
Platelets: 244 10*3/uL (ref 150–400)
RBC: 3.88 MIL/uL — ABNORMAL LOW (ref 4.22–5.81)
RDW: 18.8 % — ABNORMAL HIGH (ref 11.5–15.5)
WBC: 20.1 10*3/uL — ABNORMAL HIGH (ref 4.0–10.5)
nRBC: 0.1 % (ref 0.0–0.2)

## 2019-09-26 LAB — GLUCOSE, CAPILLARY
Glucose-Capillary: 117 mg/dL — ABNORMAL HIGH (ref 70–99)
Glucose-Capillary: 107 mg/dL — ABNORMAL HIGH (ref 70–99)
Glucose-Capillary: 148 mg/dL — ABNORMAL HIGH (ref 70–99)
Glucose-Capillary: 129 mg/dL — ABNORMAL HIGH (ref 70–99)

## 2019-09-26 LAB — BASIC METABOLIC PANEL
Anion gap: 11 (ref 5–15)
BUN: 39 mg/dL — ABNORMAL HIGH (ref 8–23)
CO2: 30 mmol/L (ref 22–32)
Calcium: 9 mg/dL (ref 8.9–10.3)
Chloride: 98 mmol/L (ref 98–111)
Creatinine, Ser: 0.88 mg/dL (ref 0.61–1.24)
GFR calc Af Amer: >60 mL/min (ref >60)
GFR calc non Af Amer: >60 mL/min (ref >60)
Glucose, Bld: 131 mg/dL — ABNORMAL HIGH (ref 70–99)
Potassium: 3.4 mmol/L — ABNORMAL LOW (ref 3.5–5.1)
Sodium: 139 mmol/L (ref 135–145)

## 2019-09-26 LAB — MAGNESIUM: Magnesium: 2 mg/dL (ref 1.7–2.4)

## 2019-09-26 MED ORDER — SODIUM CHLORIDE 0.9 % IV SOLN
2.0000 g | Freq: Three times a day (TID) | INTRAVENOUS | Status: DC
  Administered 2019-09-26 – 2019-10-01 (×16): 2 g via INTRAVENOUS
  Filled 2019-09-26 (×18): qty 2

## 2019-09-26 MED ORDER — SODIUM CHLORIDE 0.9 % IV SOLN
2.0000 g | Freq: Three times a day (TID) | INTRAVENOUS | Status: DC
  Filled 2019-09-26 (×2): qty 2

## 2019-09-26 MED ORDER — METRONIDAZOLE 500 MG PO TABS
500.0000 mg | ORAL_TABLET | Freq: Three times a day (TID) | ORAL | Status: DC
  Administered 2019-09-26 – 2019-10-01 (×16): 500 mg via ORAL
  Filled 2019-09-26 (×16): qty 1

## 2019-09-26 MED ORDER — POTASSIUM CHLORIDE CRYS ER 20 MEQ PO TBCR
40.0000 meq | EXTENDED_RELEASE_TABLET | Freq: Once | ORAL | Status: AC
  Administered 2019-09-26: 12:00:00 40 meq via ORAL
  Filled 2019-09-26: qty 2

## 2019-09-26 MED ORDER — METRONIDAZOLE IN NACL 5-0.79 MG/ML-% IV SOLN
500.0000 mg | Freq: Three times a day (TID) | INTRAVENOUS | Status: DC
  Administered 2019-09-26: 11:00:00 500 mg via INTRAVENOUS
  Filled 2019-09-26 (×3): qty 100

## 2019-09-26 MED ORDER — VANCOMYCIN 50 MG/ML ORAL SOLUTION: 125.0000 mg | Freq: Four times a day (QID) | ORAL | Status: DC

## 2019-09-26 MED ORDER — VANCOMYCIN 50 MG/ML ORAL SOLUTION
125.0000 mg | Freq: Four times a day (QID) | ORAL | Status: DC
  Administered 2019-09-26: 10:00:00 125 mg via ORAL
  Filled 2019-09-26 (×4): qty 2.5

## 2019-09-26 NOTE — Consult Note (Signed)
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 He presented to [facility] ED on 02/22 after being found to have hyponatremia to 129 and a leukocytosis to 24k on outpatient labs drawn on 02/19. On presentation he denied any complaints. Work up in the ED revealed a leukocytosis to 29K, AKI with sCr of 1.43, mild lactic acidosis to 2.3, positive pseudomonas aeruginosa on BCx, and CXR was concerning for possible RLL infiltrates. He was started on cefepime and vancomycin and admitted to medicine. He underwent CT of the Pelvis on 02/23 to evaluate his progressing sacral wound which revealed progressive bone destruction of the sacrum and coccyx. He was also evaluated by ID.   Surgery is consulted by hospitalist physician Dr. Myrene Buddy, MD in this context for evaluation and management of sacral decubitus ulceration with osteomyelitis.   PAST MEDICAL HISTORY (PMH):  Past Medical History:  Diagnosis Date  . Atrial flutter ([place])    a. s/p Cardioversion 11/22/13, on amiodarone and Xarelto.  . CHF (congestive heart failure) ([place])   . Chronic osteomyelitis ([place]) 06/30/2019   s/p colostomy  . Chronic systolic heart failure ([place])    a. 10/2013 EF 20-25%, grade III DD, RV mildly dilated and sys fx mild/mod reduced;  b. 01/2014 Echo: EF 30-35%, gr3 DD, mod dil LA. c. 07/2019 EF 20-25%  . Coronary artery disease    a. s/p MI 2007/2015;  b. s/p prior PCI to the LAD/LCX/PDA/PL;  c. 2008: s/p Cypher DES to the OM.  Marland Kitchen Crohn's ileocolitis ([place])   . GERD (gastroesophageal reflux disease)   . Hx of adenomatous colonic polyps 11/2003  . Hyperlipidemia   . Hypertension   . Hypotension   . Ischemic cardiomyopathy    s. 01/2014 s/p MDT [id number] [name] XT DR single lead  AICD.  Marland Kitchen Obesity   . Paroxysmal atrial fibrillation (H[facility])    a. CHA2DS2VASc = (CHF, HTN, agex1, DM)  . Sleep apnea   . Syncope    a.  11/2013 in setting of volume depletion and bradycardia due to dig toxicity   . Type II diabetes mellitus ([place])      PAST SURGICAL HISTORY ([place]):  Past Surgical History:  Procedure Laterality Date  . ATRIAL FLUTTER ABLATION N/A 04/16/2014   Procedure: ATRIAL FLUTTER ABLATION;  Surgeon: Evans Lance, MD;  Location: [facility] CATH LAB;  Service: Cardiovascular;  Laterality: N/A;  . CARDIAC CATHETERIZATION  10/2013  . CARDIAC DEFIBRILLATOR PLACEMENT  04/16/2014   Medtronic Evira device  . CARDIAC ELECTROPHYSIOLOGY STUDY AND ABLATION  04/16/2014   atrial flutter ablation  . CARDIOVERSION N/A 03/05/2014   Procedure: CARDIOVERSION;  Surgeon: Jolaine Artist, MD;  Location: [facility] ENDOSCOPY;  Service: Cardiovascular;  Laterality: N/A;  . CATARACT EXTRACTION W/PHACO Right 01/04/2017   Procedure: CATARACT EXTRACTION PHACO AND INTRAOCULAR LENS PLACEMENT ([place])  Right Diabetic Complicated;  Surgeon: Leandrew Koyanagi, MD;  Location: [place];  Service: Ophthalmology;  Laterality: Right;  Diabetic  . CATARACT EXTRACTION W/PHACO Left 02/08/2017   Procedure: CATARACT EXTRACTION PHACO AND INTRAOCULAR LENS PLACEMENT ([place]) left diabetic;  Surgeon: Leandrew Koyanagi, MD;  Location: [place];  Service: Ophthalmology;  Laterality: Left;  Diabetic - oral meds sleep apnea  . CORONARY ANGIOPLASTY WITH STENT PLACEMENT  2007; 2008 X 2   "  1+1 ~ 1"  . FOOT SURGERY Left    bone spur  . HYDROCELE EXCISION Bilateral   . 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 IMPLANTABLE CARDIOVERTER DEFIBRILLATOR IMPLANT N/A 04/16/2014   Procedure: IMPLANTABLE CARDIOVERTER DEFIBRILLATOR IMPLANT;  Surgeon: Evans Lance, MD;  Location: [facility] CATH LAB;  Service: Cardiovascular;  Laterality: N/A;  . LEFT HEART CATHETERIZATION WITH CORONARY ANGIOGRAM N/A  11/22/2013   Procedure: LEFT HEART CATHETERIZATION WITH CORONARY ANGIOGRAM;  Surgeon: Sinclair Grooms, MD;  Location: [facility] CATH LAB;  Service: Cardiovascular;  Laterality: N/A;  . TRANSVERSE LOOP COLOSTOMY N/A 07/03/2019   Procedure: TRANSVERSE LOOP COLOSTOMY;  Surgeon: Ronny Bacon, MD;  Location: [facility] ORS;  Service: General;  Laterality: N/A;     MEDICATIONS:  Prior to Admission medications   Medication Sig Start Date End Date Taking? Authorizing Provider  acetaminophen (TYLENOL) 325 MG tablet Take 1-2 tablets (325-650 mg total) by mouth every 4 (four) hours as needed for mild pain. 04/17/14  Yes [name], NP  Albuterol Sulfate 108 (90 Base) MCG/ACT AEPB Inhale 1 puff into the lungs every 6 (six) hours as needed (shortness of breath).    Yes [provider]  aspirin 81 MG tablet Take 81 mg by mouth daily.    Yes [provider]  atorvastatin (LIPITOR) 80 MG tablet TAKE 1 TABLET BY MOUTH EVERY DAY Patient taking differently: Take 80 mg by mouth daily.  03/08/19  Yes Gollan, Kathlene November, MD  balsalazide (COLAZAL) 750 MG capsule TAKE 1 CAPSULE (750 MG TOTAL) BY MOUTH 3 (THREE) TIMES DAILY. Patient taking differently: Take 750 mg by mouth 3 (three) times daily.  11/28/18  Yes Ladene Artist, MD  collagenase (SANTYL) ointment Apply 1 application topically daily. (apply to sacrum)   Yes [provider]  digoxin (LANOXIN) 0.125 MG tablet Take 1 tablet (0.125 mg total) by mouth daily. 07/10/19  Yes [name], MD  finasteride (PROSCAR) 5 MG tablet Take 1 tablet (5 mg total) by mouth daily. 03/20/19  Yes Wieting, Richard, MD  HYDROcodone-acetaminophen (NORCO/VICODIN) 5-325 MG tablet Take 1 tablet by mouth every 6 (six) hours as needed for severe pain. 07/09/19  Yes [name], MD  insulin aspart (NOVOLOG) 100 UNIT/ML injection Inject 5 Units into the skin 3 (three) times daily with meals. Patient taking differently: Inject 5 Units into the skin 3 (three) times daily  before meals.  03/19/19  Yes Wieting, Richard, MD  ipratropium-albuterol (DUONEB) 0.5-2.5 (3) MG/3ML SOLN Take 3 mLs by nebulization every 4 (four) hours as needed (shortness of breath or wheezing).   Yes [provider]  lactobacillus (FLORANEX/LACTINEX) PACK Take 1 packet (1 g total) by mouth 3 (three) times daily with meals. 03/19/19  Yes Wieting, Richard, MD  metoprolol succinate (TOPROL-XL) 25 MG 24 hr tablet Take 1 tablet (25 mg total) by mouth daily. 07/10/19  Yes [name], MD  midodrine (PROAMATINE) 10 MG tablet Take 1 tablet (10 mg total) by mouth 3 (three) times daily. 04/26/19  Yes Max Sane, MD  Multiple Vitamins-Minerals (DECUBI-VITE PO) Take 1 capsule by mouth daily.    Yes [provider]  niacin (NIASPAN) 1000 MG CR tablet Take 1,000 mg by mouth at bedtime.    Yes [provider]  Nutritional Supplements (FEEDING SUPPLEMENT, NEPRO CARB STEADY,) LIQD Take 237 mLs by mouth 2 (two) times daily between meals. 03/19/19  Yes Wieting, Richard, MD  omeprazole (PRILOSEC) 20 MG capsule Take 20 mg by mouth  daily.    Yes [provider]  ondansetron (ZOFRAN) 4 MG tablet Take 4 mg by mouth every 8 (eight) hours as needed for nausea or vomiting.   Yes [provider]  potassium chloride (KLOR-CON) 10 MEQ tablet Take 1 tablet (10 mEq total) by mouth daily. Patient taking differently: Take 10 mEq by mouth every evening.  05/08/19 09/23/19 Yes Gollan, Kathlene November, MD  sertraline (ZOLOFT) 25 MG tablet Take 25 mg by mouth daily.   Yes [provider]  tamsulosin (FLOMAX) 0.4 MG CAPS capsule Take 1 capsule (0.4 mg total) by mouth daily after breakfast. 03/20/19  Yes Leslye Peer, Richard, MD  torsemide (DEMADEX) 20 MG tablet Take 2 tablets (40 mg) by mouth once daily 05/08/19  Yes Gollan, Kathlene November, MD  vitamin C (VITAMIN C) 500 MG tablet Take 1 tablet (500 mg total) by mouth 2 (two) times daily. 07/09/19  Yes [name], MD  XARELTO 20 MG TABS tablet TAKE 1  TABLET BY MOUTH EVERY DAY WITH LUNCH Patient taking differently: Take 20 mg by mouth daily with lunch.  05/17/19  Yes Minna Merritts, MD     ALLERGIES:  Allergies  Allergen Reactions  . Iodine Other (See Comments)    Shortness of breath, swelling and hives  . Shrimp [Shellfish Allergy] Other (See Comments)    SWELLING    HIVES    SHORTNESS OF BREATH  . Tetracycline Rash     SOCIAL HISTORY:  Social History   Socioeconomic History  . Marital status: Widowed    Spouse name: Not on file  . Number of children: 1  . Years of education: Not on file  . Highest education level: Not on file  Occupational History  . Occupation: retired    Fish farm manager: RETIRED  Tobacco Use  . Smoking status: Never Smoker  . Smokeless tobacco: Never Used  Substance and Sexual Activity  . Alcohol use: No  . Drug use: No  . Sexual activity: Never  Other Topics Concern  . Not on file  Social History Narrative  . Not on file   Social Determinants of Health   Financial Resource Strain: Low Risk   . Difficulty of Paying Living Expenses: Not hard at all  Food Insecurity: No Food Insecurity  . Worried About Charity fundraiser in the Last Year: Never true  . Ran Out of Food in the Last Year: Never true  Transportation Needs: No Transportation Needs  . Lack of Transportation (Medical): No  . Lack of Transportation (Non-Medical): No  Physical Activity: Inactive  . Days of Exercise per Week: 0 days  . Minutes of Exercise per Session: 0 min  Stress:   . Feeling of Stress : Not on file  Social Connections: Moderately Isolated  . Frequency of Communication with Friends and Family: Never  . Frequency of Social Gatherings with Friends and Family: Never  . Attends Religious Services: Never  . Active Member of Clubs or Organizations: Yes  . Attends Archivist Meetings: 1 to 4 times per year  . Marital Status: Never married  Intimate Partner Violence: Unknown  . Fear of Current or Ex-Partner:  No  . Emotionally Abused: Not on file  . Physically Abused: No  . Sexually Abused: No     FAMILY HISTORY:  Family History  Problem Relation Age of Onset  . Breast cancer Mother   . Heart disease Father   . Heart attack Father   . Colon cancer Neg Hx  REVIEW OF SYSTEMS:  Review of Systems  Constitutional: Negative for chills and fever.  Gastrointestinal: Negative for abdominal pain, nausea and vomiting.  Genitourinary: Negative for dysuria and urgency.  Skin:       + Sacral Decubitus Ulceration  All other systems reviewed and are negative.   VITAL SIGNS:  Temp:  [97.8 F (36.6 C)-98 F (36.7 C)] 97.8 F (36.6 C) (02/24 2303) Pulse Rate:  [63-71] 65 (02/25 1055) Resp:  [16-19] 16 (02/25 1055) BP: (90-98)/(42-59) 90/59 (02/25 1055) SpO2:  [99 %-100 %] 100 % (02/25 1055)     Height: 5' 11"  (180.3 cm) Weight: 108.9 kg BMI (Calculated): 33.49   INTAKE/OUTPUT:  02/24 0701 - 02/25 0700 In: 943.7 [P.O.:840; I.V.:3.7; IV Piggyback:100] Out: 665 [Urine:465; Stool:200]  PHYSICAL EXAM:  Physical Exam Vitals and nursing note reviewed. Exam conducted with a chaperone present.  Constitutional:      General: He is not in acute distress.    Appearance: Normal appearance. He is obese. He is not ill-appearing.  HENT:     Head: Normocephalic and atraumatic.  Eyes:     General: No scleral icterus.    Conjunctiva/sclera: Conjunctivae normal.  Cardiovascular:     Rate and Rhythm: Normal rate and regular rhythm.  Pulmonary:     Effort: Pulmonary effort is normal. No respiratory distress.  Abdominal:     General: Abdomen is flat. There is no distension.     Tenderness: There is no abdominal tenderness.     Comments: Loop colostomy in left upper abdomen, gas and stool in bag  Genitourinary:    Comments: Urinary management device present Musculoskeletal:     Comments: Chronic muscle wasting in bilateral lower extremities  Skin:      Neurological:     General: No focal  deficit present.     Mental Status: He is alert and oriented to person, place, and time.  Psychiatric:        Mood and Affect: Mood normal.        Behavior: Behavior normal.      Labs:  CBC Latest Ref Rng & Units 09/26/2019 09/25/2019 09/24/2019  WBC 4.0 - 10.5 K/uL 20.1(H) 17.9(H) 24.6(H)  Hemoglobin 13.0 - 17.0 g/dL 9.6(L) 8.8(L) 9.6(L)  Hematocrit 39.0 - 52.0 % 31.7(L) 28.1(L) 31.1(L)  Platelets 150 - 400 K/uL 244 216 278   CMP Latest Ref Rng & Units 09/26/2019 09/25/2019 09/24/2019  Glucose 70 - 99 mg/dL 131(H) 266(H) 159(H)  BUN 8 - 23 mg/dL 39(H) 41(H) 48(H)  Creatinine 0.61 - 1.24 mg/dL 0.88 1.06 1.42(H)  Sodium 135 - 145 mmol/L 139 136 128(L)  Potassium 3.5 - 5.1 mmol/L 3.4(L) 3.5 3.8  Chloride 98 - 111 mmol/L 98 96(L) 84(L)  CO2 22 - 32 mmol/L 30 29 31   Calcium 8.9 - 10.3 mg/dL 9.0 8.4(L) 7.9(L)  Total Protein 6.5 - 8.1 g/dL - - -  Total Bilirubin 0.3 - 1.2 mg/dL - - -  Alkaline Phos 38 - 126 U/L - - -  AST 15 - 41 U/L - - -  ALT 0 - 44 U/L - - -     Imaging studies:   CT Pelvis (09/24/2019) personally reviewed concerning for osteomyelitis and radiologist report reviewed below:  IMPRESSION: Large presacral decubitus ulcer with progressive bone destruction of the distal sacrum and coccyx consistent with osteomyelitis.  Chronic nonspecific sclerosis within S1 segment of sacrum.  Prostatic enlargement with mild bladder wall thickening which could represent muscular hypertrophy from chronic bladder outlet obstruction  or from chronic cystitis.  Nonobstructing 4 mm calculus distal LEFT ureter.  Double-barrel colostomy LEFT lower quadrant.    Assessment/Plan: (ICD-10's: L89.154) 69 y.o. very debilitated Lamb with a history of stage VI sacral decubitus ulceration s/p loop colostomy in Dec 2020 for stool diversion found to have significant leukocytosis and progression of bone destruction secondary to osteomyelitis, complicated by multiple comorbid conditions and  immobile state.   - Dr Christian Mate preformed bone biopsy at bedside and specimen sent for culture. Patient tolerated this well.   - No further debridement necessary at this time  - Unfortunately, his immobile state is the biggest barrier to wound healing. Recommend low air loss mattress, q2 hr turning/repositioning, mobilization if feasible   - Continue IV Abx; follow up bone biopsy, ID following  - Further management per primary service; we will follow  All of the above findings and recommendations were discussed with the patient, and all of patient's questions were answered to his expressed satisfaction.  Thank you for the opportunity to participate in this patient's care.   -- Edison Simon, PA-C [facility] 09/26/2019, 12:18 PM [phone] M-F: 7am - 4pm

## 2019-09-26 NOTE — TOC Progression Note (Signed)
Transition of Care [facility]) - Progression Note    Patient Details  Name: Colton Lamb MRN: [id number] Date of Birth: 01-24-1951  [facility] CM/SW Contact  Shelbie Hutching, RN Phone Number: 09/26/2019, 3:41 PM  Clinical Narrative:    RNCM attempted to call patient 3 times today with no answer.  RNCM will attempt to reach out to patient again tomorrow.     Expected Discharge Plan: [place] Barriers to Discharge: Continued Medical Work up  Expected Discharge Plan and Services Expected Discharge Plan: [place]   Discharge Planning Services: CM Consult   Living arrangements for the past 2 months: [place]                                       Social Determinants of Health (SDOH) Interventions    Readmission Risk Interventions Readmission Risk Prevention Plan 07/18/2019 07/12/2019 06/14/2019  Transportation Screening Complete Complete Complete  Social Work Consult for [place] - - -  Medication Review Press photographer) Complete Complete Complete  PCP or Specialist appointment within 3-5 days of discharge (No Data) - Complete  HRI or [place] - Not Complete -  HRI or Home Care Consult Pt Refusal Comments return to SNF Return to SNF -  SW Recovery Care/Counseling Consult - Complete Complete  Palliative Care Screening - Not Applicable Not Applicable  Skilled Nursing Facility Complete Complete Complete  Some recent data might be hidden

## 2019-09-26 NOTE — Progress Notes (Signed)
PROGRESS NOTE    Colton Lamb  DSK:[id number] DOB: 02/18/1951 DOA: 09/23/2019 PCP: [name], MD      Brief Narrative:  Colton Lamb is a 69 y.o. 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  Patient had routine labwork at SNF that showed Na 129 and WBC 24K.  In the ER, lactate 2.3 and CXR showed pneumonia and COVID PCR positive.  Started on broad spectrum antibiotics and admitted.         Assessment & Plan:  Pseudomonas bacteremia Sepsis Doubt community acquired pneumonia Possible acute on chronic osteomyelitis Leukocytosis noted, minimal tachycardia to meet sepsis criteria.   Surgery/intubation 2 months ago, history Crohn's disease and diabetes, and CHF, increase risk for drug resistant organisms. -Continue cefepime -Consult ID -Consult Gen Surg   COVID-19 without hypoxic respiratory failure Presents in the context of the ongoing 2020-2021 COVID-19 pandemic with COVID+ PCR.  Possible atelectasis on CXR, no hypoxia or respiratory symptoms. CRP > 20 likely from bone infection.  Respiratory status stable.  In the absence of significant respiratory disease, the utility of remdesivir is unclear, and possible harm from steroid use given his osteomyelitis likely outweighs benefit. -Continue clinical monitoring   Suspected Cdiff colitis Stool testing yesterday showed toxinogenic cdiff.  In setting of leukocytosis, AKI and diarrhea, will start vancomycin. -Start vancomycin PO  AKI Hyponatremia Due to dehydration.  Cr 1.4 on admission, up from baseline 1.0.  Normalized, Cr stable, Na normal.  Chronic systolic CHF Appears euvolemic to dry -Hold torsemide for now  Atrial flutter, typical HR normal -Continue  Xarelto, digoxin -Hold metoprolol given hypotension  Coronary disease secondary prevention -Continue aspirin and Xarelto and atorvastatin -Hold metoprolol  Diabetes Glucose normal now -Continue SS and mealtime insulin  Crohn's disease s/p colostomy placement -Continue balsalazide  Anemia of chronic disease Recent baseline appears to be 7-9 g/dL, stable within reange.  No clinical bleeding. -Avoid excessive phlebotomy  Stage IV sacral pressure injury POA Stage II back pressure injuries, POA Stage I R buttock pressure injury, POA Stage II thigh presure injury, POA Stage II L butock pressure injury, POA -Consult WOC  Mood disorder -Continue sertraline  GERD -Continue pantoprazole  BPH -Continue Flomax, finasteride  Severe protein calorie malnutrition As evidenced by severe loss of subcutaneous muscle mass, thenar wasting, temporal wasting, buttock ulcer, poor oral intake.        Disposition: The patient was admitted with abnormal labs, found to have early sepsis from pelvic osteo.    He has had a bone biopsy.  We will monitor cultures, I will discharge when cultures mature to the point that a safe discharge antibiotic regimen is in place.        MDM: The below labs and imaging reports reviewed and summarized above.  Medication management as above.    DVT prophylaxis: N/A on Xarelto Code Status: FULL Family Communication: Will call sister BMacie Burowsby phone    Consultants:   Infectious disease  Procedures:   2/23 CT pelvis -- osteomyelitis  Antimicrobials:   Vancomycin last dose 2/23 at 2200  Ceftriaxone x1 on 2/23  Cefepime 2/22 >>  Azithromycin last dose on 2/23   Oral vancomycin 2/25 >>  Culture data:   2/22 blood culture 1/2 cultures with pseudomonas  Urine culture NG  yet          Subjective: Patient oriented x3.  Weak.  Too tired and bilateral leg weak to sit or stand.  No fever, no vomiting.  Stool in ostomy is normal.   No confusion.  No focal weakness, chest pain, dyspnea, cough.       Objective: Vitals:   09/25/19 0846 09/25/19 1659 09/25/19 2303 09/26/19 1055  BP:  (!) 98/51 (!) 91/42 (!) 90/59  Pulse:  63 71 65  Resp: 16 19 18 16   Temp:  98 F (36.7 C) 97.8 F (36.6 C)   TempSrc:  Oral Oral   SpO2:  99% 100% 100%  Weight:      Height:        Intake/Output Summary (Last 24 hours) at 09/26/2019 1237 Last data filed at 09/25/2019 1735 Gross per 24 hour  Intake 240 ml  Output 565 ml  Net -325 ml   Filed Weights   09/23/19 1629  Weight: 108.9 kg    Examination: General appearance: chronically ill appearing adult male, alert and in no acute distress.  Appears listless HEENT: Anicteric, conjunctiva pink, lids and lashes normal. No nasal deformity, discharge, epistaxis.  Lips moist, OP tacky dry, no oral lesions.   Skin: Warm and dry.  No suspicious rashes or lesions.  Pale.  Skin wound not examined by me today. Cardiac: RRR, no murmurs appreciated.  No LE edema.    Respiratory: Normal respiratory rate and rhythm.  CTAB without rales or wheezes. Abdomen: Abdomen soft.  No TTP or guarding. No ascites, distension, hepatosplenomegaly.   MSK: No deformities or effusions of the large joints of the upper or lower extremities bilaterally.  Diffuse loss of muscle mass. Neuro: Awake and alert. Naming is grossly intact, and the patient's recall, recent and remote, as well as general fund of knowledge seem within normal limits.  Muscle tone severely diminshed, without fasciculations.  Moves upper extremities equally but with severe weakness. Does not lift arms.  Speech fluent.    Psych: Sensorium intact and responding to questions, attention diminshed. Affect flat.  Judgment and insight appear normal.     Data Reviewed: I have personally reviewed following labs and imaging studies:  CBC: Recent Labs  Lab 09/23/19 1723 09/24/19 0138 09/25/19 0446 09/26/19 0401  WBC 29.7* 24.6* 17.9* 20.1*    NEUTROABS 25.7* 21.2*  --   --   HGB 10.9* 9.6* 8.8* 9.6*  HCT 35.2* 31.1* 28.1* 31.7*  MCV 81.3 81.4 80.5 81.7  PLT 333 278 216 970   Basic Metabolic Panel: Recent Labs  Lab 09/23/19 1740 09/24/19 0138 09/25/19 0446 09/26/19 0401  NA 134* 128* 136 139  K 3.5 3.8 3.5 3.4*  CL 85* 84* 96* 98  CO2 32 31 29 30   GLUCOSE 172* 159* 266* 131*  BUN 50* 48* 41* 39*  CREATININE 1.43* 1.42* 1.06 0.88  CALCIUM 9.0 7.9* 8.4* 9.0  MG 1.8  --  2.1 2.0  PHOS 2.9  --   --   --    GFR: Estimated Creatinine Clearance: 100.8 mL/min (by C-G formula based on SCr of 0.88 mg/dL). Liver Function Tests: Recent Labs  Lab 09/23/19 1740  AST 24  ALT 10  ALKPHOS 137*  BILITOT 0.8  PROT 7.3  ALBUMIN 2.5*   No results for input(s): LIPASE, AMYLASE in the last 168 hours. No results for input(s): AMMONIA in the last 168 hours. Coagulation Profile: Recent Labs  Lab 09/24/19 0138  INR 2.5*   Cardiac  Enzymes: No results for input(s): CKTOTAL, CKMB, CKMBINDEX, TROPONINI in the last 168 hours. BNP (last 3 results) No results for input(s): PROBNP in the last 8760 hours. HbA1C: No results for input(s): HGBA1C in the last 72 hours. CBG: Recent Labs  Lab 09/25/19 1107 09/25/19 1614 09/25/19 2131 09/26/19 0739 09/26/19 1151  GLUCAP 211* 150* 124* 117* 107*   Lipid Profile: No results for input(s): CHOL, HDL, LDLCALC, TRIG, CHOLHDL, LDLDIRECT in the last 72 hours. Thyroid Function Tests: No results for input(s): TSH, T4TOTAL, FREET4, T3FREE, THYROIDAB in the last 72 hours. Anemia Panel: No results for input(s): VITAMINB12, FOLATE, FERRITIN, TIBC, IRON, RETICCTPCT in the last 72 hours. Urine analysis:    Component Value Date/Time   COLORURINE AMBER (A) 09/23/2019 1817   APPEARANCEUR TURBID (A) 09/23/2019 1817   LABSPEC 1.013 09/23/2019 1817   PHURINE 5.0 09/23/2019 1817   GLUCOSEU NEGATIVE 09/23/2019 1817   HGBUR NEGATIVE 09/23/2019 1817   BILIRUBINUR NEGATIVE 09/23/2019 1817    KETONESUR NEGATIVE 09/23/2019 1817   PROTEINUR NEGATIVE 09/23/2019 1817   UROBILINOGEN 1.0 11/27/2013 1017   NITRITE NEGATIVE 09/23/2019 1817   LEUKOCYTESUR LARGE (A) 09/23/2019 1817   Sepsis Labs: @LABRCNTIP (procalcitonin:4,lacticacidven:4)  ) Recent Results (from the past 240 hour(s))  Culture, blood (routine x 2)     Status: None (Preliminary result)   Collection Time: 09/23/19  8:33 PM   Specimen: BLOOD  Result Value Ref Range Status   Specimen Description BLOOD RIGHT HAND  Final   Special Requests   Final    BOTTLES DRAWN AEROBIC AND ANAEROBIC Blood Culture results may not be optimal due to an inadequate volume of blood received in culture bottles   Culture  Setup Time   Final    Organism ID to follow ANAEROBIC BOTTLE ONLY GRAM NEGATIVE RODS CRITICAL RESULT CALLED TO, READ BACK BY AND VERIFIED WITH: DAVID BESANTI AT 0600 ON 09/26/2019 [place]. Performed at [facility], [address], [place], [place] [address]    Culture GRAM NEGATIVE RODS  Final   Report Status PENDING  Incomplete  Culture, blood (routine x 2)     Status: Abnormal (Preliminary result)   Collection Time: 09/23/19  8:33 PM   Specimen: BLOOD  Result Value Ref Range Status   Specimen Description   Final    BLOOD RIGHT FOREARM Performed at [facility], [address] [place], [place] [address]    Special Requests   Final    BOTTLES DRAWN AEROBIC AND ANAEROBIC Blood Culture results may not be optimal due to an inadequate volume of blood received in culture bottles Performed at [facility], [address] [place], [place] [address]    Culture  Setup Time   Final    GRAM NEGATIVE RODS ANAEROBIC BOTTLE ONLY CRITICAL VALUE NOTED.  VALUE IS CONSISTENT WITH PREVIOUSLY REPORTED AND CALLED VALUE. Performed at [facility], [place] [address] [place],  [address]    Culture PSEUDOMONAS AERUGINOSA (A)  Final   Report Status PENDING  Incomplete  Blood Culture ID Panel  (Reflexed)     Status: Abnormal   Collection Time: 09/23/19  8:33 PM  Result Value Ref Range Status   Enterococcus species NOT DETECTED NOT DETECTED Final   Listeria monocytogenes NOT DETECTED NOT DETECTED Final   Staphylococcus species NOT DETECTED NOT DETECTED Final   Staphylococcus aureus (BCID) NOT DETECTED NOT DETECTED Final   Streptococcus species NOT DETECTED NOT DETECTED Final   Streptococcus agalactiae NOT DETECTED NOT DETECTED Final  Streptococcus pneumoniae NOT DETECTED NOT DETECTED Final   Streptococcus pyogenes NOT DETECTED NOT DETECTED Final   Acinetobacter baumannii NOT DETECTED NOT DETECTED Final   Enterobacteriaceae species NOT DETECTED NOT DETECTED Final   Enterobacter cloacae complex NOT DETECTED NOT DETECTED Final   Escherichia coli NOT DETECTED NOT DETECTED Final   Klebsiella oxytoca NOT DETECTED NOT DETECTED Final   Klebsiella pneumoniae NOT DETECTED NOT DETECTED Final   Proteus species NOT DETECTED NOT DETECTED Final   Serratia marcescens NOT DETECTED NOT DETECTED Final   Carbapenem resistance NOT DETECTED NOT DETECTED Final   Haemophilus influenzae NOT DETECTED NOT DETECTED Final   Neisseria meningitidis NOT DETECTED NOT DETECTED Final   Pseudomonas aeruginosa DETECTED (A) NOT DETECTED Final    Comment: CRITICAL RESULT CALLED TO, READ BACK BY AND VERIFIED WITH: DAVID BESANTI AT 0600 ON 09/26/2019 [place].    Candida albicans NOT DETECTED NOT DETECTED Final   Candida glabrata NOT DETECTED NOT DETECTED Final   Candida krusei NOT DETECTED NOT DETECTED Final   Candida parapsilosis NOT DETECTED NOT DETECTED Final   Candida tropicalis NOT DETECTED NOT DETECTED Final    Comment: Performed at [facility], [place], [place] [address]  SARS CORONAVIRUS 2 (TAT 6-24 HRS) Nasopharyngeal Nasopharyngeal Swab     Status: Abnormal   Collection Time: 09/23/19  9:06 PM   Specimen: Nasopharyngeal Swab  Result Value Ref Range Status   SARS Coronavirus 2  POSITIVE (A) NEGATIVE Final    Comment: RESULT CALLED TO, READ BACK BY AND VERIFIED WITH: Chauncy Passy, RN [facility]) AT [phone] ON 09/24/19 BY C. JESSUP, MT. (NOTE) SARS-CoV-2 target nucleic acids are DETECTED. The SARS-CoV-2 RNA is generally detectable in upper and lower respiratory specimens during the acute phase of infection. Positive results are indicative of the presence of SARS-CoV-2 RNA. Clinical correlation with patient history and other diagnostic information is  necessary to determine patient infection status. Positive results do not rule out bacterial infection or co-infection with other viruses.  The expected result is Negative. Fact Sheet for Patients: SugarRoll.be Fact Sheet for Healthcare Providers: https://www.woods-mathews.com/ This test is not yet approved or cleared by the [place] FDA and  has been authorized for detection and/or diagnosis of SARS-CoV-2 by FDA under an Emergency Use Authorization (EUA). This EUA will remain  in effect (meaning this  test can be used) for the duration of the COVID-19 declaration under Section 564(b)(1) of the Act, 21 U.S.C. section 360bbb-3(b)(1), unless the authorization is terminated or revoked sooner. Performed at [facility], [place] [address], [place], [place] [address]   Urine Culture     Status: None   Collection Time: 09/25/19  7:45 AM   Specimen: Urine, Random  Result Value Ref Range Status   Specimen Description   Final    URINE, RANDOM Performed at [facility], [address], [place], [place] [address]    Special Requests   Final    NONE Performed at [facility], [address], [place], [place] [address]    Culture   Final    NO GROWTH Performed at [facility], [place] [address], [place],  [address]    Report Status 09/26/2019 FINAL  Final  MRSA PCR Screening     Status: None   Collection Time: 09/25/19  8:36 AM   Specimen:  Nasopharyngeal  Result Value Ref Range Status   MRSA by PCR NEGATIVE NEGATIVE Final    Comment:  The GeneXpert MRSA Assay (FDA approved for NASAL specimens only), is one component of a comprehensive MRSA colonization surveillance program. It is not intended to diagnose MRSA infection nor to guide or monitor treatment for MRSA infections. Performed at [facility], [address] [place], [place] [address]   C difficile quick scan w PCR reflex     Status: Abnormal   Collection Time: 09/25/19  3:56 PM   Specimen: STOOL  Result Value Ref Range Status   C Diff antigen POSITIVE (A) NEGATIVE Final   C Diff toxin NEGATIVE NEGATIVE Final   C Diff interpretation Results are indeterminate. See PCR results.  Final    Comment: Performed at [facility], [address] [place], [place] [address]  C. Diff by PCR, Reflexed     Status: Abnormal   Collection Time: 09/25/19  3:56 PM  Result Value Ref Range Status   Toxigenic C. Difficile by PCR POSITIVE (A) NEGATIVE Final    Comment: Positive for toxigenic C. difficile with little to no toxin production. Only treat if clinical presentation suggests symptomatic illness. Performed at [facility], [address], [place], [place] [address]          Radiology Studies: No results found.      Scheduled Meds: . ascorbic acid  500 mg Oral BID  . aspirin EC  81 mg Oral Daily  . atorvastatin  80 mg Oral Daily  . balsalazide  750 mg Oral TID  . collagenase  1 application Topical Daily  . digoxin  0.125 mg Oral Daily  . finasteride  5 mg Oral Daily  . insulin aspart  0-5 Units Subcutaneous QHS  . insulin aspart  0-9 Units Subcutaneous TID WC  . insulin aspart  5 Units Subcutaneous TID AC  . lactobacillus  1 g Oral TID WC  . metroNIDAZOLE  500 mg Oral Q8H  . midodrine  10 mg Oral TID  . multivitamin-lutein  1 capsule Oral Daily  . niacin  1,000 mg Oral QHS  . pantoprazole  40 mg Oral Daily  .  rivaroxaban  20 mg Oral Q lunch  . sertraline  25 mg Oral Daily  . sodium chloride flush  3 mL Intravenous Q12H  . tamsulosin  0.4 mg Oral QPC breakfast   Continuous Infusions: . sodium chloride Stopped (09/25/19 0724)  . ceFEPime (MAXIPIME) IV 2 g (09/26/19 0952)     LOS: 3 days    Time spent: 25 minutes    Edwin Dada, MD [facility] 09/26/2019, 12:37 PM     Please page though [place] or Epic secure chat:  For [employer], Adult nurse

## 2019-09-26 NOTE — NC FL2 (Signed)
[place] LEVEL OF CARE SCREENING TOOL     IDENTIFICATION  Patient Name: Colton Lamb Birthdate: 1951-07-09 Sex: male Admission Date (Current Location): 09/23/2019  [place] and [place] Number:  Engineering geologist and Address:  [facility], [address] [place], [place] [address]      Provider Number: [id number]  Attending Physician Name and Address:  Edwin Dada, *  Relative Name and Phone Number:  [name]- sister [phone]    Current Level of Care: Hospital Recommended Level of Care: [place] Prior Approval Number:    Date Approved/Denied:   PASRR Number: [id number]  Discharge Plan: SNF    Current Diagnoses: Patient Active Problem List   Diagnosis Date Noted  . HCAP (healthcare-associated pneumonia) 09/23/2019  . Colostomy in place [facility]) 09/23/2019  . Bleeding 07/11/2019  . Chronic osteomyelitis ([place]) 06/30/2019  . PICC (peripherally inserted central catheter) flush   . Normocytic anemia 06/21/2019  . Decubitus ulcer of sacral region, unstageable ([place]) 06/19/2019  . Hypotension 06/19/2019  . Anemia   . Acute respiratory failure ([place]) 06/12/2019  . Acute on chronic congestive heart failure ([place])   . Acute blood loss anemia   . Pressure injury of skin 03/17/2019  . Acute renal failure (ARF) ([place])   . Empyema lung ([place])   . Shortness of breath   . Pleural effusion on right   . Sepsis ([place]) 03/04/2019  . Restless legs syndrome (RLS) 06/16/2017  . Hypertension, essential 06/16/2017  . CAD in native artery 04/27/2017  . Hydrocephalus ([place]) 09/08/2016  . Dizziness and giddiness   . Elevated troponin 09/06/2016  . Type II diabetes mellitus ([place])   . Ischemic cardiomyopathy   . Hyponatremia 07/01/2015  . Diabetes mellitus type 2, uncontrolled, with complications ([place]) 30/04/2329  . 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 OSA (obstructive sleep apnea)  12/20/2013  . Morbid obesity ([place]) 12/20/2013  . AKI (acute kidney injury) - Creatinine improved at d/c 12/14/2013  . Elevated TSH - will need f/u TFTs with PCP in 3-4 weeks 12/14/2013  . Junctional bradycardia - resolved 12/14/2013  . Syncope - due to bradycardia in setting of Digoxin Toxicity 12/11/2013  . Acute on chronic HFrEF (heart failure with reduced ejection fraction) ([place]) 12/06/2013  . At risk for sudden cardiac death - on LifeVest 2013/12/23  . Acute on chronic combined systolic and diastolic CHF (congestive heart failure) ([place]) 2013-12-23  . Cardiomyopathy, ischemic 12-23-2013  . Atrial flutter ([place]) 11/22/2013  . NSTEMI (non-ST elevated myocardial infarction) ([place]) 11/22/2013  . Long term current use of anticoagulant 10/19/2010  . Hyperlipidemia 05/06/2010  . Edema 05/06/2010  . CAD S/P percutaneous coronary angioplasty - multiple PCIs 11/11/2008  . Atrial fibrillation ([place]) 11/11/2008  . GERD 11/11/2008  . [place] INTESTINE 11/11/2008  . COLONIC POLYPS, HX OF 11/11/2008  . SHELLFISH ALLERGY 11/11/2008    Orientation RESPIRATION BLADDER Height & Weight     Self, Time, Situation, Place  Normal Incontinent Weight: 108.9 kg Height:  5' 11"  (180.3 cm)  BEHAVIORAL SYMPTOMS/MOOD NEUROLOGICAL BOWEL NUTRITION STATUS      Colostomy Diet  AMBULATORY STATUS COMMUNICATION OF NEEDS Skin   Extensive Assist Verbally PU Stage and Appropriate Care(stage 4 sacral decub)                       Personal Care Assistance Level of Assistance  Bathing, Feeding, Dressing Bathing Assistance: Maximum assistance Feeding assistance: Independent  Dressing Assistance: Maximum assistance     Functional Limitations Info             SPECIAL CARE FACTORS FREQUENCY  PT (By licensed PT)     PT Frequency: 5 times per week              Contractures Contractures Info: Not present    Additional Factors Info  Code Status, Allergies Code Status Info:  Full Allergies Info: Iodine, shrimp, tetracycline           Current Medications (09/26/2019):  This is the current hospital active medication list Current Facility-Administered Medications  Medication Dose Route Frequency Provider Last Rate Last Admin  . 0.9 %  sodium chloride infusion  250 mL Intravenous PRN Cristescu, Linard Millers, MD   Stopped at 09/25/19 0724  . acetaminophen (TYLENOL) tablet 650 mg  650 mg Oral Q6H PRN Cristescu, Mircea G, MD       Or  . acetaminophen (TYLENOL) suppository 650 mg  650 mg Rectal Q6H PRN Cristescu, Mircea G, MD      . albuterol (PROVENTIL) (2.5 MG/3ML) 0.083% nebulizer solution 2.5 mg  2.5 mg Inhalation Q6H PRN Cristescu, Mircea G, MD      . ascorbic acid (VITAMIN C) tablet 500 mg  500 mg Oral BID Cristescu, Linard Millers, MD   500 mg at 09/26/19 0826  . aspirin EC tablet 81 mg  81 mg Oral Daily Cristescu, Linard Millers, MD   81 mg at 09/26/19 0825  . atorvastatin (LIPITOR) tablet 80 mg  80 mg Oral Daily Cristescu, Mircea G, MD   80 mg at 09/25/19 1704  . balsalazide (COLAZAL) capsule 750 mg  750 mg Oral TID Cristescu, Linard Millers, MD   750 mg at 09/26/19 0825  . bisacodyl (DULCOLAX) EC tablet 5 mg  5 mg Oral Daily PRN Cristescu, Mircea G, MD      . ceFEPIme (MAXIPIME) 2 g in sodium chloride 0.9 % 100 mL IVPB  2 g Intravenous Q8H Danford, Suann Larry, MD 200 mL/hr at 09/26/19 0952 2 g at 09/26/19 0952  . collagenase (SANTYL) ointment 1 application  1 application Topical Daily Cristescu, Linard Millers, MD   1 application at 37/90/24 0826  . digoxin (LANOXIN) tablet 0.125 mg  0.125 mg Oral Daily Cristescu, Mircea G, MD   0.125 mg at 09/26/19 0946  . finasteride (PROSCAR) tablet 5 mg  5 mg Oral Daily Cristescu, Linard Millers, MD   5 mg at 09/26/19 0826  . HYDROcodone-acetaminophen (NORCO/VICODIN) 5-325 MG per tablet 1 tablet  1 tablet Oral Q6H PRN Cristescu, Mircea G, MD      . insulin aspart (novoLOG) injection 0-5 Units  0-5 Units Subcutaneous QHS Danford, Christopher P, MD      .  insulin aspart (novoLOG) injection 0-9 Units  0-9 Units Subcutaneous TID WC Danford, Suann Larry, MD   1 Units at 09/25/19 1703  . insulin aspart (novoLOG) injection 5 Units  5 Units Subcutaneous TID AC Cristescu, Linard Millers, MD   5 Units at 09/26/19 0825  . lactobacillus (FLORANEX/LACTINEX) granules 1 g  1 g Oral TID WC Cristescu, Linard Millers, MD   1 g at 09/26/19 1212  . magnesium citrate solution 1 Bottle  1 Bottle Oral Once PRN Cristescu, Mircea G, MD      . metroNIDAZOLE (FLAGYL) tablet 500 mg  500 mg Oral Q8H Ravishankar, Jayashree, MD      . midodrine (PROAMATINE) tablet 10 mg  10 mg Oral TID Cristescu, Mircea  G, MD   10 mg at 09/26/19 0826  . multivitamin-lutein (OCUVITE-LUTEIN) capsule 1 capsule  1 capsule Oral Daily Cristescu, Linard Millers, MD   1 capsule at 09/26/19 0946  . niacin (NIASPAN) CR tablet 1,000 mg  1,000 mg Oral QHS Cristescu, Mircea G, MD   1,000 mg at 09/25/19 2144  . ondansetron (ZOFRAN) tablet 4 mg  4 mg Oral Q6H PRN Cristescu, Mircea G, MD       Or  . ondansetron (ZOFRAN) injection 4 mg  4 mg Intravenous Q6H PRN Cristescu, Mircea G, MD      . pantoprazole (PROTONIX) EC tablet 40 mg  40 mg Oral Daily Cristescu, Linard Millers, MD   40 mg at 09/26/19 0826  . polyethylene glycol (MIRALAX / GLYCOLAX) packet 17 g  17 g Oral Daily PRN Cristescu, Mircea G, MD      . rivaroxaban (XARELTO) tablet 20 mg  20 mg Oral Q lunch Cristescu, Mircea G, MD   20 mg at 09/26/19 1212  . sertraline (ZOLOFT) tablet 25 mg  25 mg Oral Daily Cristescu, Mircea G, MD   25 mg at 09/26/19 0825  . sodium chloride flush (NS) 0.9 % injection 3 mL  3 mL Intravenous Q12H Cristescu, Linard Millers, MD   3 mL at 09/25/19 2144  . sodium chloride flush (NS) 0.9 % injection 3 mL  3 mL Intravenous PRN Cristescu, Mircea G, MD      . tamsulosin (FLOMAX) capsule 0.4 mg  0.4 mg Oral QPC breakfast Cristescu, Linard Millers, MD   0.4 mg at 09/26/19 8721     Discharge Medications: Please see discharge summary for a list of discharge  medications.  Relevant Imaging Results:  Relevant Lab Results:   Additional Information SSN: [place]  Shelbie Hutching, RN

## 2019-09-26 NOTE — Plan of Care (Addendum)
Problem: Activity: Goal: Risk for activity intolerance will decrease Outcome: Not Progressing  -Patient has not been out of bed this shift, patient is able to turn to his side with minimal assistance, encouraged patient to turn himself. Low air loss mattress and Q2 turns ordered for patient to help offset pressure.   Problem: Nutrition: Goal: Adequate nutrition will be maintained Outcome: Progressing  -Appetite has been improving, see I/Os. Able to feed himself.    Problem: Coping: Goal: Level of anxiety will decrease Outcome: Progressing  -No c/o needs, physical or emotional.    Problem: Elimination: Goal: Will not experience complications related to urinary retention Outcome: Progressing  -Purewick applied to patient as he is incontinent and to keep wounds dry as possible, no retention noted.   Problem: Safety: Goal: Ability to remain free from injury will improve Outcome: Progressing

## 2019-09-26 NOTE — Progress Notes (Signed)
PT Cancellation Note  Patient Details Name: Colton Lamb MRN: [id number] DOB: 12-Aug-1950   Cancelled Treatment:    Reason Eval/Treat Not Completed: PT screened, no needs identified, will sign off. Pt familiar to our services from multiple recent prior admissions. Pt nonambulatory >6 months, requires +2 Max assist for all bed mobility and transfers, all determined to be new updated baseline. Pt will be participate in his typical daily mobility regimen here during ADL performance with nursing. As pt is at recent baseline, no skilled PT services warranted at this time. Pt is total care at baseline, will need heavy support at DC, recommend return to SNF setting for support. PT signing off.   3:04 PM, 09/26/19 Etta Grandchild, PT, DPT Physical Therapist - [facility]  [phone] ([place])     Wash[place] C 09/26/2019, 3:01 PM

## 2019-09-26 NOTE — Progress Notes (Signed)
ID   Patient Vitals for the past 24 hrs:  BP Temp Temp src Pulse Resp SpO2  09/26/19 1055 (!) 90/59 -- -- 65 16 100 %  09/25/19 2303 (!) 91/42 97.8 F (36.6 C) Oral 71 18 100 %  09/25/19 1659 (!) 98/51 98 F (36.7 C) Oral 63 19 99 %   Sacral decub      CBC Latest Ref Rng & Units 09/26/2019 09/25/2019 09/24/2019  WBC 4.0 - 10.5 K/uL 20.1(H) 17.9(H) 24.6(H)  Hemoglobin 13.0 - 17.0 g/dL 9.6(L) 8.8(L) 9.6(L)  Hematocrit 39.0 - 52.0 % 31.7(L) 28.1(L) 31.1(L)  Platelets 150 - 400 K/uL 244 216 278     CMP Latest Ref Rng & Units 09/26/2019 09/25/2019 09/24/2019  Glucose 70 - 99 mg/dL 131(H) 266(H) 159(H)  BUN 8 - 23 mg/dL 39(H) 41(H) 48(H)  Creatinine 0.61 - 1.24 mg/dL 0.88 1.06 1.42(H)  Sodium 135 - 145 mmol/L 139 136 128(L)  Potassium 3.5 - 5.1 mmol/L 3.4(L) 3.5 3.8  Chloride 98 - 111 mmol/L 98 96(L) 84(L)  CO2 22 - 32 mmol/L 30 29 31   Calcium 8.9 - 10.3 mg/dL 9.0 8.4(L) 7.9(L)  Total Protein 6.5 - 8.1 g/dL - - -  Total Bilirubin 0.3 - 1.2 mg/dL - - -  Alkaline Phos 38 - 126 U/L - - -  AST 15 - 41 U/L - - -  ALT 0 - 44 U/L - - -     Impression/Recommendation  Pseudomonas bacteremia-  ? Source- sacral decubitus which is Stage IV could be the  source eventhough not looking grossly infected No obvious pneumonia No UTI Agree with cefepime- He will need IV antibiotics long term- await susceptibility   Sacral decubitus Stage IV-with chronic osteomyelitis Recently was treated with IV zosyn for 4 weeks Also had diverting colostomy Wound vac But because of inadequate offloading the wound has progressed will add PO flagyl   Afib on xarelto- which has caused bleeding into the sacral decub and also around the colostomy site before  Cdiff antigen positive , but toxin neg- so likely colonization No specific treatment  Discussed with  care team

## 2019-09-26 NOTE — Progress Notes (Signed)
Wound Care completed per orders. Patient tolerated well. Offered pain medication to patient however patient refused and states he has no needs.

## 2019-09-26 NOTE — Progress Notes (Signed)
Patient contact, [name], returned phone call and was updated on plan of care.

## 2019-09-26 NOTE — Progress Notes (Signed)
Pseudomonas aeruginosa isolated in 1/2 blood culture bottles. Given his increase in white count and marginal blood pressures concerning for worsening sepsis, treatment with cefepime initiated.

## 2019-09-26 NOTE — Progress Notes (Addendum)
PHARMACY - PHYSICIAN COMMUNICATION CRITICAL VALUE ALERT - BLOOD CULTURE IDENTIFICATION (BCID)  Colton Lamb is an 70 y.o. male who presented to [facility] on 09/23/2019 with a chief complaint of abnormal lab/weakness   Assessment:  WBC 17 >> 20, hypotensive w/ systolic 91 - 98, 1/4 GNR anaerobic bottle BCID Pseudomonas aeruginosa  (include suspected source if known)  Name of physician (or Provider) Contacted: Sharion Settler  Current antibiotics: None  Changes to prescribed antibiotics recommended:  Recommendations accepted by provider -- Will start patient on cefepime 2g IV q8h per CrCl > 60 ml/min for pseudomonas coverage and possible worsening sepsis and will continue to f/u on Cx/Sx  Results for orders placed or performed during the hospital encounter of 09/23/19  Blood Culture ID Panel (Reflexed) (Collected: 09/23/2019  8:33 PM)  Result Value Ref Range   Enterococcus species NOT DETECTED NOT DETECTED   Listeria monocytogenes NOT DETECTED NOT DETECTED   Staphylococcus species NOT DETECTED NOT DETECTED   Staphylococcus aureus (BCID) NOT DETECTED NOT DETECTED   Streptococcus species NOT DETECTED NOT DETECTED   Streptococcus agalactiae NOT DETECTED NOT DETECTED   Streptococcus pneumoniae NOT DETECTED NOT DETECTED   Streptococcus pyogenes NOT DETECTED NOT DETECTED   Acinetobacter baumannii NOT DETECTED NOT DETECTED   Enterobacteriaceae species NOT DETECTED NOT DETECTED   Enterobacter cloacae complex NOT DETECTED NOT DETECTED   Escherichia coli NOT DETECTED NOT DETECTED   Klebsiella oxytoca NOT DETECTED NOT DETECTED   Klebsiella pneumoniae NOT DETECTED NOT DETECTED   Proteus species NOT DETECTED NOT DETECTED   Serratia marcescens NOT DETECTED NOT DETECTED   Carbapenem resistance NOT DETECTED NOT DETECTED   Haemophilus influenzae NOT DETECTED NOT DETECTED   Neisseria meningitidis NOT DETECTED NOT DETECTED   Pseudomonas aeruginosa DETECTED (A) NOT DETECTED   Candida albicans NOT  DETECTED NOT DETECTED   Candida glabrata NOT DETECTED NOT DETECTED   Candida krusei NOT DETECTED NOT DETECTED   Candida parapsilosis NOT DETECTED NOT DETECTED   Candida tropicalis NOT DETECTED NOT DETECTED   Tobie Lords, PharmD, BCPS Clinical Pharmacist 09/26/2019  6:29 AM

## 2019-09-26 NOTE — Progress Notes (Signed)
Attempted to call [name] for update on patient. No answer at this time. Left voicemail for a return call.

## 2019-09-27 ENCOUNTER — Ambulatory Visit: Payer: Medicare Other | Admitting: Family

## 2019-09-27 DIAGNOSIS — Z7901 Long term (current) use of anticoagulants: Secondary | ICD-10-CM

## 2019-09-27 DIAGNOSIS — Z515 Encounter for palliative care: Secondary | ICD-10-CM

## 2019-09-27 DIAGNOSIS — M4628 Osteomyelitis of vertebra, sacral and sacrococcygeal region: Secondary | ICD-10-CM

## 2019-09-27 DIAGNOSIS — B965 Pseudomonas (aeruginosa) (mallei) (pseudomallei) as the cause of diseases classified elsewhere: Secondary | ICD-10-CM

## 2019-09-27 DIAGNOSIS — Z7189 Other specified counseling: Secondary | ICD-10-CM

## 2019-09-27 DIAGNOSIS — R7881 Bacteremia: Secondary | ICD-10-CM

## 2019-09-27 LAB — CBC
HCT: 26.1 % — ABNORMAL LOW (ref 39.0–52.0)
Hemoglobin: 8 g/dL — ABNORMAL LOW (ref 13.0–17.0)
MCH: 24.9 pg — ABNORMAL LOW (ref 26.0–34.0)
MCHC: 30.7 g/dL (ref 30.0–36.0)
MCV: 81.3 fL (ref 80.0–100.0)
Platelets: 194 10*3/uL (ref 150–400)
RBC: 3.21 MIL/uL — ABNORMAL LOW (ref 4.22–5.81)
RDW: 18.7 % — ABNORMAL HIGH (ref 11.5–15.5)
WBC: 24.2 10*3/uL — ABNORMAL HIGH (ref 4.0–10.5)
nRBC: 0 % (ref 0.0–0.2)

## 2019-09-27 LAB — COMPREHENSIVE METABOLIC PANEL
ALT: 11 U/L (ref 0–44)
AST: 18 U/L (ref 15–41)
Albumin: 1.8 g/dL — ABNORMAL LOW (ref 3.5–5.0)
Alkaline Phosphatase: 106 U/L (ref 38–126)
Anion gap: 8 (ref 5–15)
BUN: 38 mg/dL — ABNORMAL HIGH (ref 8–23)
CO2: 28 mmol/L (ref 22–32)
Calcium: 8.6 mg/dL — ABNORMAL LOW (ref 8.9–10.3)
Chloride: 99 mmol/L (ref 98–111)
Creatinine, Ser: 0.88 mg/dL (ref 0.61–1.24)
GFR calc Af Amer: >60 mL/min (ref >60)
GFR calc non Af Amer: >60 mL/min (ref >60)
Glucose, Bld: 151 mg/dL — ABNORMAL HIGH (ref 70–99)
Potassium: 3.7 mmol/L (ref 3.5–5.1)
Sodium: 135 mmol/L (ref 135–145)
Total Bilirubin: 0.6 mg/dL (ref 0.3–1.2)
Total Protein: 5.6 g/dL — ABNORMAL LOW (ref 6.5–8.1)

## 2019-09-27 LAB — GLUCOSE, CAPILLARY
Glucose-Capillary: 119 mg/dL — ABNORMAL HIGH (ref 70–99)
Glucose-Capillary: 106 mg/dL — ABNORMAL HIGH (ref 70–99)
Glucose-Capillary: 138 mg/dL — ABNORMAL HIGH (ref 70–99)
Glucose-Capillary: 131 mg/dL — ABNORMAL HIGH (ref 70–99)

## 2019-09-27 LAB — CULTURE, BLOOD (ROUTINE X 2)

## 2019-09-27 LAB — MAGNESIUM: Magnesium: 1.8 mg/dL (ref 1.7–2.4)

## 2019-09-27 MED ORDER — FLORANEX PO PACK
1.0000 g | PACK | Freq: Three times a day (TID) | ORAL | Status: DC
  Administered 2019-09-28 – 2019-10-02 (×12): 1 g via ORAL
  Filled 2019-09-27 (×12): qty 1

## 2019-09-27 MED ORDER — GENTAMICIN SULFATE 40 MG/ML IJ SOLN
200.0000 mg | Freq: Three times a day (TID) | INTRAVENOUS | Status: DC
  Administered 2019-09-28 (×2): 200 mg via INTRAVENOUS
  Filled 2019-09-27 (×6): qty 5

## 2019-09-27 MED ORDER — BACID PO TABS
2.0000 | ORAL_TABLET | Freq: Three times a day (TID) | ORAL | Status: DC
  Filled 2019-09-27 (×3): qty 2

## 2019-09-27 MED ORDER — GENTAMICIN SULFATE 40 MG/ML IJ SOLN
220.0000 mg | Freq: Once | INTRAVENOUS | Status: AC
  Administered 2019-09-27: 220 mg via INTRAVENOUS
  Filled 2019-09-27: qty 5.5

## 2019-09-27 NOTE — Progress Notes (Signed)
New referral for [facility] community Palliative to follow at home received from Palliative NP Kathie Rhodes. Patient has been followed by the community program in the past, but is not currently.Patient information given to referral. Per TOC note, patient will discharge with [facility] home health. Thank you for this referral. Flo Shanks BSN, RN, [place] [phone]

## 2019-09-27 NOTE — Consult Note (Signed)
Pharmacy Antibiotic Note  Colton Lamb is a 69 y.o. male admitted on 09/23/2019 with bacteremia.  Pharmacy has been consulted for gentamicin dosing. Pseudomonas bacteremia- concern for endocarditis because of AICD.   Plan: Day 5 of abx. Adding gentamicin on day 5.   On cefepime 2 g q8H   Will use traditional dosing, as concern for endocarditis. BMI 33. Adjusted body weight 89 kg. Will give a loading dose of 220 mg x 1 (2.5 mg/kg), followed by 200 mg q8H (90% of LD). CrCl > 90.   Height: 5' 11"  (180.3 cm) Weight: 240 lb (108.9 kg) IBW/kg (Calculated) : 75.3  Temp (24hrs), Avg:98.2 F (36.8 C), Min:98.2 F (36.8 C), Max:98.2 F (36.8 C)  Recent Labs  Lab 09/23/19 1703 09/23/19 1723 09/23/19 1740 09/23/19 1903 09/23/19 2146 09/23/19 2307 09/24/19 0138 09/25/19 0446 09/26/19 0401 09/27/19 0035  WBC  --  29.7*  --   --   --   --  24.6* 17.9* 20.1* 24.2*  CREATININE  --   --  1.43*  --   --   --  1.42* 1.06 0.88 0.88  LATICACIDVEN 1.9  --   --  2.3* 1.8 1.4 1.3  --   --   --     Estimated Creatinine Clearance: 100.8 mL/min (by C-G formula based on SCr of 0.88 mg/dL).    Allergies  Allergen Reactions  . Iodine Other (See Comments)    Shortness of breath, swelling and hives  . Shrimp [Shellfish Allergy] Other (See Comments)    SWELLING    HIVES    SHORTNESS OF BREATH  . Tetracycline Rash    Antimicrobials this admission: 2/22 cefepime >>  2/26 gentamicin >>   Dose adjustments this admission: None  Microbiology results: 2/22 BCx: PSA pan-sensitive  2/24 UCx: NGTD  2/25 Wound culture: MODERATE PROTEUS MIRABILIS, FEW GRAM POSITIVE COCCI, FEW GRAM NEGATIVE RODS, RARE GRAM POSITIVE RODS  2/24 MRSA PCR: negative  Thank you for allowing pharmacy to be a part of this patient's care.  Oswald Hillock, PharmD, BCPS 09/27/2019 7:41 PM

## 2019-09-27 NOTE — TOC Progression Note (Signed)
Transition of Care [facility] - Progression Note    Patient Details  Name: Colton Lamb MRN: [id number] Date of Birth: 1950-09-09  Transition of Care [name]) CM/SW Contact  Shelbie Hutching, RN Phone Number: 09/27/2019, 9:55 AM  Clinical Narrative:    RNCM talked with patient via phone and attempted to discuss discharge planning.  Patient keeps stating that "I'm just taking it one day at a time".  Patient will not at this time commit to wanting to go to SNF or if he wants to attempt to go home with home health services.  Patient is currently total care for ADL's and will struggle if sent home.  Patient owes [facility] a large sum before they will let him go back to them.  Patient reports to this RNCM that he cannot pay what is owed to the facility.  RNCM will attempt to connect with patient at a later time to discuss discharge disposition further.     Expected Discharge Plan: [place] Barriers to Discharge: Continued Medical Work up  Expected Discharge Plan and Services Expected Discharge Plan: [place]   Discharge Planning Services: CM Consult   Living arrangements for the past 2 months: [place]                                       Social Determinants of Health (SDOH) Interventions    Readmission Risk Interventions Readmission Risk Prevention Plan 07/18/2019 07/12/2019 06/14/2019  Transportation Screening Complete Complete Complete  Social Work Consult for [place] - - -  Medication Review Press photographer) Complete Complete Complete  PCP or Specialist appointment within 3-5 days of discharge (No Data) - Complete  HRI or [place] - Not Complete -  HRI or Home Care Consult Pt Refusal Comments return to SNF Return to SNF -  SW Recovery Care/Counseling Consult - Complete Complete  Palliative Care Screening - Not Applicable Not Applicable  Skilled  Nursing Facility Complete Complete Complete  Some recent data might be hidden

## 2019-09-27 NOTE — TOC Progression Note (Signed)
Transition of Care [facility]) - Progression Note    Patient Details  Name: Colton Lamb MRN: [id number] Date of Birth: 16-May-1951  Transition of Care [facility]) CM/SW Contact  Shelbie Hutching, RN Phone Number: 09/27/2019, 2:53 PM  Clinical Narrative:    Patient verbalizes understanding that he cannot go back to [facility]- he refuses to pay the bill.  Patient is out of Medicare SNF days.  Patient agrees to go home with home health, [facility] has agreed to accept the referral for RN, PT, OT, aide and SW.   Patient will also likely need IV antibiotics.  This RNCM did send out to other skilled nursing facilities but none have offered a bed at this time.   RNCM spoke with patient's sister and she said she will be able to look in on patient at home and she will also look into hiring a company to provide personal care services.   Palliative care is familiar with patient and involved.  MD is to speak with patient about hospice care.     Expected Discharge Plan: [place] Barriers to Discharge: Continued Medical Work up  Expected Discharge Plan and Services Expected Discharge Plan: [place]   Discharge Planning Services: CM Consult   Living arrangements for the past 2 months: [place]                                       Social Determinants of Health (SDOH) Interventions    Readmission Risk Interventions Readmission Risk Prevention Plan 07/18/2019 07/12/2019 06/14/2019  Transportation Screening Complete Complete Complete  Social Work Consult for [place] - - -  Medication Review Press photographer) Complete Complete Complete  PCP or Specialist appointment within 3-5 days of discharge (No Data) - Complete  HRI or [place] - Not Complete -  HRI or Home Care Consult Pt Refusal Comments return to SNF Return to SNF -  SW Recovery Care/Counseling Consult - Complete  Complete  Palliative Care Screening - Not Applicable Not Applicable  Skilled Nursing Facility Complete Complete Complete  Some recent data might be hidden

## 2019-09-27 NOTE — TOC Progression Note (Signed)
Transition of Care (TOC) - Progression Note   Patient has a stage 4 pressure injury to his sacrum and chronic osteomylitis which requires patients hips and buttocks to be positioned in ways not feasible with a normal bed. Head must be elevated at least 30 degrees or patient will experience severe pain.  Stage 4 pressure ulcer of sacrum requires frequent changes in body position which cannot be achieved with a normal bed.  Patient Details  Name: Colton Lamb MRN: [id number] Date of Birth: March 28, 1951  Transition of Care [facility]) CM/SW Contact  Shelbie Hutching, RN Phone Number: 09/27/2019, 3:57 PM  Clinical Narrative:       Expected Discharge Plan: [place] Barriers to Discharge: Continued Medical Work up  Expected Discharge Plan and Services Expected Discharge Plan: [place]   Discharge Planning Services: CM Consult Post Acute Care Choice: [place] arrangements for the past 2 months: [place]: RN, PT, OT, Nurse's Aide, Social Work [facility] Agency: [facility] Date [place]: 09/27/19 Time [place]: 1400 Representative spoke with at [place]: [place] ([place]) Interventions    Readmission Risk Interventions Readmission Risk Prevention Plan 07/18/2019 07/12/2019 06/14/2019  Transportation Screening Complete Complete Complete  Social Work Consult for [place] - - -  Medication Review Press photographer) Complete Complete Complete  PCP or Specialist appointment within 3-5 days of discharge (No Data) - Complete  HRI or Home Care Consult - Not Complete -  HRI or Home Care Consult Pt Refusal Comments return to SNF Return to SNF -  SW Recovery Care/Counseling Consult - Complete Complete  Palliative Care Screening - Not Applicable Not Applicable  Skilled Nursing  Facility Complete Complete Complete  Some recent data might be hidden

## 2019-09-27 NOTE — Progress Notes (Signed)
Date of Admission:  09/23/2019    ID: Colton Lamb is a 69 y.o. male  Principal Problem:   Sepsis ([place]) Active Problems:   Hyperlipidemia   CAD S/P percutaneous coronary angioplasty - multiple PCIs   Atrial fibrillation (HCC)   GERD   CROHN'S DISEASE-LARGE & SMALL INTESTINE   Long term current use of anticoagulant   Cardiomyopathy, ischemic   OSA (obstructive sleep apnea)   ICD (implantable cardioverter-defibrillator) in place   Diabetes mellitus type 2, uncontrolled, with complications ([name])   Type II diabetes mellitus ([place])   Hypertension, essential   Pressure injury of skin   Decubitus ulcer of sacral region, unstageable ([name])   Chronic osteomyelitis ([place])   HCAP (healthcare-associated pneumonia)   Colostomy in place [name])   Goals of care, counseling/discussion   Palliative care by specialist    Subjective: Feeling fine Sitting in bed and eating   Medications:  . ascorbic acid  500 mg Oral BID  . aspirin EC  81 mg Oral Daily  . atorvastatin  80 mg Oral Daily  . balsalazide  750 mg Oral TID  . collagenase  1 application Topical Daily  . digoxin  0.125 mg Oral Daily  . finasteride  5 mg Oral Daily  . insulin aspart  0-5 Units Subcutaneous QHS  . insulin aspart  0-9 Units Subcutaneous TID WC  . insulin aspart  5 Units Subcutaneous TID AC  . [START ON 09/28/2019] lactobacillus  1 g Oral TID WC  . metroNIDAZOLE  500 mg Oral Q8H  . midodrine  10 mg Oral TID  . multivitamin-lutein  1 capsule Oral Daily  . niacin  1,000 mg Oral QHS  . pantoprazole  40 mg Oral Daily  . rivaroxaban  20 mg Oral Q lunch  . sertraline  25 mg Oral Daily  . sodium chloride flush  3 mL Intravenous Q12H  . tamsulosin  0.4 mg Oral QPC breakfast    Objective: Vital signs in last 24 hours: Temp:  [98.2 F (36.8 C)] 98.2 F (36.8 C) (02/26 0026) Pulse Rate:  [64-73] 64 (02/26 0857) Resp:  [17] 17 (02/26 0026) BP: (94-103)/(55-65) 100/63 (02/26 1701) SpO2:  [100 %] 100 % (02/26  0026)  PHYSICAL EXAM:  General: Alert, cooperative, no distress,  Head: Normocephalic, without obvious abnormality, atraumatic. Eyes: Conjunctivae clear, anicteric sclerae. Pupils are equal ENT Nares normal. No drainage or sinus tenderness. Lips, mucosa, and tongue normal. No Thrush Neck: Supple, symmetrical, no adenopathy, thyroid: non tender no carotid bruit and no JVD. Back:sacral decubitus stage IV    Lungs: b/l air entry Heart: irregular Abdomen: Soft, colostomy  Extremities: atraumatic, no cyanosis. No edema. No clubbing Skin: No rashes or lesions. Or bruising Lymph: Cervical, supraclavicular normal. Neurologic: Grossly non-focal Pt moves all four limbs well  Lab Results Recent Labs    09/26/19 0401 09/27/19 0035  WBC 20.1* 24.2*  HGB 9.6* 8.0*  HCT 31.7* 26.1*  NA 139 135  K 3.4* 3.7  CL 98 99  CO2 30 28  BUN 39* 38*  CREATININE 0.88 0.88   Liver Panel Recent Labs    09/27/19 0035  PROT 5.6*  ALBUMIN 1.8*  AST 18  ALT 11  ALKPHOS 106  BILITOT 0.6   Microbiology: [name] -09/23/19 pseudomonas 2/2 anerobic bottle 2/26 - BC pending  Studies/Results: CT abd  Assessment/Plan:  Pseudomonas bacteremia- unclear source Sacral decubitus questioned but does not look overtly infected No obvious pneumonia or UTI He has AICD and hence  risk  for endocarditis , lead vegetation Will need 2 d echo and TEE HE is currently on cefepime- will add aminoglycoside or quinolone for treatment like endocarditis especially as the leucocytosis keeps increasing. Will keep a close watch on the creatinine and low threshold for discontinuation Repeat blood culture has already been sent  Stage IV sacral decubitus with chronic osteomyelitis Bone biopsy has been sent for culture by surgery.   Afib on xarelto- which has caused bleeding into the sacral decub and also around the colostomy site before  Cdiff antigen positive , but toxin neg- so likely colonization No specific  treatment  Discussed the management with hospitalist Dr.[phone])  will follow the patient in my absence starting Monday.

## 2019-09-27 NOTE — Progress Notes (Signed)
PT Cancellation Note  Patient Details Name: ARHUM PEEPLES MRN: [id number] DOB: 1951-07-04   Cancelled Treatment:    Reason Eval/Treat Not Completed: Consult received and chart reviewed.  Evaluation attempted, but patient adamantly refusing participation despite encouragement, multiple offers.  Patient clearly frustrated with hospitalization, discharge options and plan; however, verbalizes potential plan for discharge home with [facility] services.  Very poor insight/awareness of situation and overall implications.  Attempted to problem-solve negotiation of home environment with patient; patient, again, acknowledges current immobilization and inability to indep manage in home, but unable to generate alternatives and repeats sentiment that he should be able to manage "in a week or two".  Did discuss benefit of turning, repositioning in bed to offload wound and promote healing; offered assist while in room.  Patient refuses repositioning off back/buttocks; doesn't believe he is doing damage to the area "because I can't feel it".  Very difficult to engage, educate and redirect to meaningful conversation and intervention at this time.  Will continue efforts at later date.  Of note, per surgical team, recommend patient "start with bed mobility and repositioning"; may progress to sitting as able.     Floy Riegler H. Owens Shark, PT, DPT, NCS 09/27/19, 3:20 PM [phone]

## 2019-09-27 NOTE — Progress Notes (Signed)
Pt encouraged to do leg lifts and leg exercises in bed to build leg strength, also turned and repositioned throughout shift

## 2019-09-27 NOTE — Progress Notes (Addendum)
PROGRESS NOTE    Colton Lamb  ILN:[id number] DOB: 04/14/1951 DOA: 09/23/2019 PCP: Colton Pitch, MD      Brief Narrative:  Colton Lamb is a 69 y.o. 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  Patient had routine labwork at SNF that showed Na 129 and WBC 24K.  In the ER, lactate 2.3 and CXR showed pneumonia and COVID PCR positive.  Started on broad spectrum antibiotics and admitted.         Assessment & Plan:  Pseudomonas bacteremia Sepsis Doubt community acquired pneumonia Possible acute on chronic osteomyelitis Patient sent from facility for leukocytosis.  In ER, met SIRS criteria based on mild tachycardia and leukocytosis, as well as blood pressure less than 100.    2 of 2 blood cultures growing Pseudomonas.  Sensitivities to cefepime, Zosyn, ceftazidime, and imipenem. -Continue cefepime, Flagyl -Consult infectious disease, appreciate expertise   -Wound offloading, air mattress, wound care/Aquacel -If able to consistently offload the wound, will need outpatient plastic surgery consultation for skin flap     COVID-19 without hypoxic respiratory failure Presented with incidental COVID+ PCR in the context of the ongoing 2020-2021 COVID-19 pandemic.  Possible atelectasis on CXR, no hypoxia or respiratory symptoms.  In the absence of significant respiratory disease, the utility of remdesivir is unclear, and possible harm from steroid use given his osteomyelitis likely outweighs benefit.  Suspected Cdiff colitis Stool testing yesterday showed toxinogenic cdiff.  In setting of leukocytosis, AKI and diarrhea.   -Continue Flagyl  AKI Hyponatremia Due to dehydration.  Cr 1.4 on admission, up from baseline 1.0.   Creatinine stabilized  Chronic systolic CHF Appears euvolemic to dry.  Blood pressure soft. -Hold torsemide for now  Atrial flutter, typical HR controlled -Continue Xarelto, digoxin -Hold metoprolol given hypotension  Coronary disease secondary prevention -Continue aspirin and Xarelto and atorvastatin -Hold metoprolol  Diabetes Glucose control -Continue SS and mealtime insulin  Crohn's disease s/p colostomy placement -Continue balsalazide  Anemia of chronic disease Recent baseline appears to be 7-9 g/dL, trended down but still stable within range, no clinical bleeding. -Avoid excessive phlebotomy  Stage IV sacral pressure injury POA Stage II back pressure injuries, POA Stage I R buttock pressure injury, POA Stage II thigh presure injury, POA Stage II L butock pressure injury, POA -Consult WOC  Mood disorder -Continue sertraline  GERD -Continue pantoprazole  BPH -Continue Flomax, finasteride  Severe protein calorie malnutrition As evidenced by severe loss of subcutaneous muscle mass, thenar wasting, temporal wasting, buttock ulcer, poor oral intake.           Disposition: The patient was admitted with abnormal labs, found to have early sepsis from pelvic osteo.    He has had a bone biopsy.  At present, he requires extended IV antibiotic therapy.  We will place PICC if cultures from yesterday are negative at 48 hours, then discharge possibly Sunday to SNF with extended antibiotic therapy.         MDM: The below labs and imaging reports reviewed and summarized above.  Medication management as above.    DVT prophylaxis: N/A on Xarelto Code Status: FULL Family Communication: Colton Lamb     Consultants:   Infectious disease  Procedures:   2/23 CT pelvis -- osteomyelitis  Antimicrobials:   Vancomycin last  dose 2/23 at 2200  Ceftriaxone x1 on 2/23  Cefepime 2/22 >>  Azithromycin last dose on 2/23   Oral vancomycin 2/25 >>  Culture  data:   2/22 blood culture 1/2 cultures with pseudomonas  Urine culture NG yet          Subjective: No new fever, confusion, vomiting, focal weakness, chest pain, dyspnea, cough.          Objective: Vitals:   09/26/19 1055 09/26/19 1731 09/27/19 0026 09/27/19 0857  BP: (!) 90/59 101/63 (!) 103/56 98/65  Pulse: 65 91 73 64  Resp: 16 (!) 22 17   Temp:  98.7 F (37.1 C) 98.2 F (36.8 C)   TempSrc:      SpO2: 100% 98% 100%   Weight:      Height:        Intake/Output Summary (Last 24 hours) at 09/27/2019 1305 Last data filed at 09/27/2019 0522 Gross per 24 hour  Intake --  Output 350 ml  Net -350 ml   Filed Weights   09/23/19 1629  Weight: 108.9 kg    Examination: General appearance: Cachectic adult male, alert and in no acute distress.  Lying in bed, listless. HEENT: Anicteric, conjunctiva pink, lids and lashes normal. No nasal deformity, discharge, epistaxis.  Lips dry, OP tacky dry, no oral lesions.  Hearing normal. Skin: Warm and dry.  No suspicious rashes or lesions.  Large sacral ulcer, as below   Cardiac: RRR, no murmurs appreciated.  No LE edema.    Respiratory: Normal respiratory rate and rhythm.  CTAB without rales or wheezes. Abdomen: Abdomen soft.  No tenderness palpation or guarding. No ascites, distension, hepatosplenomegaly.   MSK: No deformities or effusions of the large joints of the upper or lower extremities bilaterally.  Diffuse severe loss of subcutaneous muscle mass and fat. Neuro: Awake and alert. Naming is grossly intact, and the patient's recall, recent and remote, as well as general fund of knowledge seem within normal limits.  Muscle tone nearly flaccid throughout, without fasciculations.  Moves upper extremities extremities equally with severe generalized weakness, can barely move legs.  Speech fluent.    Psych: Sensorium intact and responding to questions, attention normal. Affect flat.  Judgment and insight appear normal.       Data Reviewed: I have personally reviewed following labs and imaging studies:  CBC: Recent Labs  Lab 09/23/19 1723 09/24/19 0138 09/25/19 0446 09/26/19 0401 09/27/19 0035  WBC 29.7* 24.6* 17.9* 20.1* 24.2*  NEUTROABS 25.7* 21.2*  --   --   --   HGB 10.9* 9.6* 8.8* 9.6* 8.0*  HCT 35.2* 31.1* 28.1* 31.7* 26.1*  MCV 81.3 81.4 80.5 81.7 81.3  PLT 333 278 216 244 875   Basic Metabolic Panel: Recent Labs  Lab 09/23/19 1740 09/24/19 0138 09/25/19 0446 09/26/19 0401 09/27/19 0035  NA 134* 128* 136 139 135  K 3.5 3.8 3.5 3.4* 3.7  CL 85* 84* 96* 98 99  CO2 32 31 29 30 28   GLUCOSE 172* 159* 266* 131* 151*  BUN 50* 48* 41* 39* 38*  CREATININE 1.43* 1.42* 1.06 0.88 0.88  CALCIUM 9.0 7.9* 8.4* 9.0 8.6*  MG 1.8  --  2.1 2.0 1.8  PHOS 2.9  --   --   --   --    GFR: Estimated Creatinine Clearance: 100.8 mL/min (by C-G formula based on SCr of 0.88 mg/dL). Liver Function Tests: Recent Labs  Lab 09/23/19 1740 09/27/19 0035  AST 24 18  ALT 10  11  ALKPHOS 137* 106  BILITOT 0.8 0.6  PROT 7.3 5.6*  ALBUMIN 2.5* 1.8*   No results for input(s): LIPASE, AMYLASE in the last 168 hours. No results for input(s): AMMONIA in the last 168 hours. Coagulation Profile: Recent Labs  Lab 09/24/19 0138  INR 2.5*   Cardiac Enzymes: No results for input(s): CKTOTAL, CKMB, CKMBINDEX, TROPONINI in the last 168 hours. BNP (last 3 results) No results for input(s): PROBNP in the last 8760 hours. HbA1C: No results for input(s): HGBA1C in the last 72 hours. CBG: Recent Labs  Lab 09/26/19 1151 09/26/19 1614 09/26/19 2115 09/27/19 0816 09/27/19 1117  GLUCAP 107* 148* 129* 119* 106*   Lipid Profile: No results for input(s): CHOL, HDL, LDLCALC, TRIG, CHOLHDL, LDLDIRECT in the last 72 hours. Thyroid Function Tests: No results for input(s): TSH, T4TOTAL, FREET4, T3FREE, THYROIDAB in the last 72 hours. Anemia Panel: No results for input(s): VITAMINB12, FOLATE, FERRITIN, TIBC, IRON,  RETICCTPCT in the last 72 hours. Urine analysis:    Component Value Date/Time   COLORURINE AMBER (A) 09/23/2019 1817   APPEARANCEUR TURBID (A) 09/23/2019 1817   LABSPEC 1.013 09/23/2019 1817   PHURINE 5.0 09/23/2019 1817   GLUCOSEU NEGATIVE 09/23/2019 1817   HGBUR NEGATIVE 09/23/2019 1817   BILIRUBINUR NEGATIVE 09/23/2019 1817   KETONESUR NEGATIVE 09/23/2019 1817   PROTEINUR NEGATIVE 09/23/2019 1817   UROBILINOGEN 1.0 11/27/2013 1017   NITRITE NEGATIVE 09/23/2019 1817   LEUKOCYTESUR LARGE (A) 09/23/2019 1817   Sepsis Labs: @LABRCNTIP (procalcitonin:4,lacticacidven:4)  ) Recent Results (from the past 240 hour(s))  Culture, blood (routine x 2)     Status: Abnormal (Preliminary result)   Collection Time: 09/23/19  8:33 PM   Specimen: BLOOD  Result Value Ref Range Status   Specimen Description BLOOD RIGHT HAND  Final   Special Requests   Final    BOTTLES DRAWN AEROBIC AND ANAEROBIC Blood Culture results may not be optimal due to an inadequate volume of blood received in culture bottles   Culture  Setup Time   Final    Organism ID to follow ANAEROBIC BOTTLE ONLY GRAM NEGATIVE RODS CRITICAL RESULT CALLED TO, READ BACK BY AND VERIFIED WITH: DAVID BESANTI AT 0600 ON 09/26/2019 [place]. Performed at [facility], [address] [place], [place] [address]    Culture (A)  Final    PSEUDOMONAS AERUGINOSA SUSCEPTIBILITIES PERFORMED ON PREVIOUS CULTURE WITHIN THE LAST 5 DAYS.    Report Status PENDING  Incomplete  Culture, blood (routine x 2)     Status: Abnormal   Collection Time: 09/23/19  8:33 PM   Specimen: BLOOD  Result Value Ref Range Status   Specimen Description   Final    BLOOD RIGHT FOREARM Performed at [facility], [address] [place], [place] [address]    Special Requests   Final    BOTTLES DRAWN AEROBIC AND ANAEROBIC Blood Culture results may not be optimal due to an inadequate volume of blood received in culture bottles Performed at [facility], [address], [place], [place] [address]    Culture  Setup Time   Final    GRAM NEGATIVE RODS ANAEROBIC BOTTLE ONLY CRITICAL VALUE NOTED.  VALUE IS CONSISTENT WITH PREVIOUSLY REPORTED AND CALLED VALUE. Performed at [facility], [place] [address], [place], [place] [address]    Culture PSEUDOMONAS AERUGINOSA (A)  Final   Report Status 09/27/2019 FINAL  Final   Organism ID, Bacteria PSEUDOMONAS AERUGINOSA  Final      Susceptibility  Pseudomonas aeruginosa - MIC*    CEFTAZIDIME 4 SENSITIVE Sensitive     CIPROFLOXACIN <=0.25 SENSITIVE Sensitive     GENTAMICIN <=1 SENSITIVE Sensitive     IMIPENEM 1 SENSITIVE Sensitive     PIP/TAZO 8 SENSITIVE Sensitive     CEFEPIME 2 SENSITIVE Sensitive     * PSEUDOMONAS AERUGINOSA  Blood Culture ID Panel (Reflexed)     Status: Abnormal   Collection Time: 09/23/19  8:33 PM  Result Value Ref Range Status   Enterococcus species NOT DETECTED NOT DETECTED Final   Listeria monocytogenes NOT DETECTED NOT DETECTED Final   Staphylococcus species NOT DETECTED NOT DETECTED Final   Staphylococcus aureus (BCID) NOT DETECTED NOT DETECTED Final   Streptococcus species NOT DETECTED NOT DETECTED Final   Streptococcus agalactiae NOT DETECTED NOT DETECTED Final   Streptococcus pneumoniae NOT DETECTED NOT DETECTED Final   Streptococcus pyogenes NOT DETECTED NOT DETECTED Final   Acinetobacter baumannii NOT DETECTED NOT DETECTED Final   Enterobacteriaceae species NOT DETECTED NOT DETECTED Final   Enterobacter cloacae complex NOT DETECTED NOT DETECTED Final   Escherichia coli NOT DETECTED NOT DETECTED Final   Klebsiella oxytoca NOT DETECTED NOT DETECTED Final   Klebsiella pneumoniae NOT DETECTED NOT DETECTED Final   Proteus species NOT DETECTED NOT DETECTED Final   Serratia marcescens NOT DETECTED NOT DETECTED Final   Carbapenem resistance NOT DETECTED NOT DETECTED Final   Haemophilus influenzae NOT DETECTED NOT DETECTED Final   Neisseria  meningitidis NOT DETECTED NOT DETECTED Final   Pseudomonas aeruginosa DETECTED (A) NOT DETECTED Final    Comment: CRITICAL RESULT CALLED TO, READ BACK BY AND VERIFIED WITH: DAVID BESANTI AT 0600 ON 09/26/2019 [place].    Candida albicans NOT DETECTED NOT DETECTED Final   Candida glabrata NOT DETECTED NOT DETECTED Final   Candida krusei NOT DETECTED NOT DETECTED Final   Candida parapsilosis NOT DETECTED NOT DETECTED Final   Candida tropicalis NOT DETECTED NOT DETECTED Final    Comment: Performed at [facility], [place], [place] [address]  SARS CORONAVIRUS 2 (TAT 6-24 HRS) Nasopharyngeal Nasopharyngeal Swab     Status: Abnormal   Collection Time: 09/23/19  9:06 PM   Specimen: Nasopharyngeal Swab  Result Value Ref Range Status   SARS Coronavirus 2 POSITIVE (A) NEGATIVE Final    Comment: RESULT CALLED TO, READ BACK BY AND VERIFIED WITH: Chauncy Passy, RN [facility]) AT [phone] ON 09/24/19 BY C. JESSUP, MT. (NOTE) SARS-CoV-2 target nucleic acids are DETECTED. The SARS-CoV-2 RNA is generally detectable in upper and lower respiratory specimens during the acute phase of infection. Positive results are indicative of the presence of SARS-CoV-2 RNA. Clinical correlation with patient history and other diagnostic information is  necessary to determine patient infection status. Positive results do not rule out bacterial infection or co-infection with other viruses.  The expected result is Negative. Fact Sheet for Patients: SugarRoll.be Fact Sheet for Healthcare Providers: https://www.woods-mathews.com/ This test is not yet approved or cleared by the [place] FDA and  has been authorized for detection and/or diagnosis of SARS-CoV-2 by FDA under an Emergency Use Authorization (EUA). This EUA will remain  in effect (meaning this  test can be used) for the duration of the COVID-19 declaration under Section 564(b)(1) of the Act, 21  U.S.C. section 360bbb-3(b)(1), unless the authorization is terminated or revoked sooner. Performed at [facility], [place] [address], [place], [place] [address]   Urine Culture     Status: None   Collection Time:  09/25/19  7:45 AM   Specimen: Urine, Random  Result Value Ref Range Status   Specimen Description   Final    URINE, RANDOM Performed at [facility], [address], [place], [place] [address]    Special Requests   Final    NONE Performed at [facility], [address] [place] [address], [place], [place] [address]    Culture   Final    NO GROWTH Performed at [facility], [place] [address] [place], [place] [address]    Report Status 09/26/2019 FINAL  Final  MRSA PCR Screening     Status: None   Collection Time: 09/25/19  8:36 AM   Specimen: Nasopharyngeal  Result Value Ref Range Status   MRSA by PCR NEGATIVE NEGATIVE Final    Comment:        The GeneXpert MRSA Assay (FDA approved for NASAL specimens only), is one component of a comprehensive MRSA colonization surveillance program. It is not intended to diagnose MRSA infection nor to guide or monitor treatment for MRSA infections. Performed at [facility], [address] [place], [place] [address]   C difficile quick scan w PCR reflex     Status: Abnormal   Collection Time: 09/25/19  3:56 PM   Specimen: STOOL  Result Value Ref Range Status   C Diff antigen POSITIVE (A) NEGATIVE Final   C Diff toxin NEGATIVE NEGATIVE Final   C Diff interpretation Results are indeterminate. See PCR results.  Final    Comment: Performed at [facility], [address] [place], [place] [address]  C. Diff by PCR, Reflexed     Status: Abnormal   Collection Time: 09/25/19  3:56 PM  Result Value Ref Range Status   Toxigenic C. Difficile by PCR POSITIVE (A) NEGATIVE Final    Comment: Positive for toxigenic C. difficile with little to no toxin production. Only treat if clinical presentation suggests  symptomatic illness. Performed at [facility], [address], [place], [place] [address]   Aerobic/Anaerobic Culture (surgical/deep wound)     Status: None (Preliminary result)   Collection Time: 09/26/19 12:07 PM   Specimen: Bone; Wound  Result Value Ref Range Status   Specimen Description   Final    BONE Performed at [facility], [address] [place], [place] [address]    Special Requests   Final    NONE Performed at [facility], [address] [place], [place] [address]    Gram Stain   Final    FEW WBC PRESENT, PREDOMINANTLY PMN FEW GRAM POSITIVE COCCI FEW GRAM NEGATIVE RODS RARE GRAM POSITIVE RODS    Culture   Final    MODERATE GRAM NEGATIVE RODS CULTURE REINCUBATED FOR BETTER GROWTH Performed at [facility], [place] [address] [place], [place] [address]    Report Status PENDING  Incomplete  Culture, blood (Routine X 2) w Reflex to ID Panel     Status: None (Preliminary result)   Collection Time: 09/27/19 12:34 AM   Specimen: Right Antecubital; Blood  Result Value Ref Range Status   Specimen Description RIGHT ANTECUBITAL  Final   Special Requests Blood Culture adequate volume  Final   Culture   Final    NO GROWTH < 12 HOURS Performed at [facility], [address] [place], [place] [address]    Report Status PENDING  Incomplete  Culture, blood (Routine X 2) w Reflex to ID Panel     Status: None (Preliminary  result)   Collection Time: 09/27/19 12:40 AM   Specimen: BLOOD RIGHT HAND  Result Value Ref Range Status   Specimen Description BLOOD RIGHT HAND  Final   Special Requests Blood Culture adequate volume  Final   Culture   Final    NO GROWTH < 12 HOURS Performed at [facility], [address] [place], [place] [address]    Report Status PENDING  Incomplete         Radiology Studies: No results found.      Scheduled Meds: . ascorbic acid  500 mg Oral BID  . aspirin EC  81 mg Oral Daily    . atorvastatin  80 mg Oral Daily  . balsalazide  750 mg Oral TID  . collagenase  1 application Topical Daily  . digoxin  0.125 mg Oral Daily  . finasteride  5 mg Oral Daily  . insulin aspart  0-5 Units Subcutaneous QHS  . insulin aspart  0-9 Units Subcutaneous TID WC  . insulin aspart  5 Units Subcutaneous TID AC  . lactobacillus  1 g Oral TID WC  . metroNIDAZOLE  500 mg Oral Q8H  . midodrine  10 mg Oral TID  . multivitamin-lutein  1 capsule Oral Daily  . niacin  1,000 mg Oral QHS  . pantoprazole  40 mg Oral Daily  . rivaroxaban  20 mg Oral Q lunch  . sertraline  25 mg Oral Daily  . sodium chloride flush  3 mL Intravenous Q12H  . tamsulosin  0.4 mg Oral QPC breakfast   Continuous Infusions: . sodium chloride 250 mL (09/27/19 0002)  . ceFEPime (MAXIPIME) IV 2 g (09/27/19 0902)     LOS: 4 days    Time spent: 25 minutes    Edwin Dada, MD [facility] 09/27/2019, 1:05 PM     Please page though [place] or Epic secure chat:  For [employer], Adult nurse

## 2019-09-27 NOTE — Consult Note (Signed)
Consultation Note Date: 09/27/2019   Patient Name: Colton Lamb  DOB: Apr 17, 1951  MRN: [id number]  Age / Sex: 69 y.o., male  PCP: [name], MD Referring Physician: Edwin Dada, *  Reason for Consultation: Establishing goals of care  HPI/Patient Profile: 69 y.o. 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  Also found to be COVID positive. Being treated for pseudomonas bacteremia and possible acute on chronic osteomyelitis. Patient has multiple pressure ulcers present on arrival. PMT consulted to assist with [place].  Clinical Assessment and Goals of Care: I have reviewed medical records including EPIC notes, labs and imaging, received report from Dr. Loleta Books, assessed the patient and then met with patient to discuss diagnosis prognosis, GOC, EOL wishes, disposition and options.  Of note, patient has met with palliative medicine team multiple times and has been seen by outpatient palliative - most often he elects full code and full scope interventions.  I introduced Palliative Medicine as specialized medical care for people living with serious illness. It focuses on providing relief from the symptoms and stress of a serious illness. The goal is to improve quality of life for both the patient and the family.  Patient tells me about living in facilities since August and his desire to be at home. He has many frustrations about living in facilities that he expressed.  As far as functional and nutritional status, he tells me he has not walked in a year. He is dependent on staff for daily cares. When I ask about nutrition he tells me "it's getting better". He tells me was experiencing poor appetite for a while.   At this point in  conversation we were joined by Dr. Loleta Books who further discussed patient's condition with him.  Patient continues to express desire to avoid facility placement and would like to go home with home health support. He states he does have a sister that is retired and lives locally and can assist with his needs.   His goals are to get better and he is interested in whatever medical interventions can assist him with meeting that goal.  Questions and concerns were addressed.   Primary Decision Maker PATIENT    SUMMARY OF RECOMMENDATIONS   - will ask palliative to see outpatient - full code/full scope - patient hopeful for home with therapies  Code Status/Advance Care Planning:  Full code   Symptom Management:   Per primary  Prognosis:   Unable to determine  Discharge Planning: Home with Palliative Services , home health     Primary Diagnoses: Present on Admission: . Hyperlipidemia . Atrial fibrillation ([place]) . GERD . Cardiomyopathy, ischemic . OSA (obstructive sleep apnea) . Sepsis ([place]) . ICD (implantable cardioverter-defibrillator) in place . Diabetes mellitus type 2, uncontrolled, with complications ([place]) . Hypertension, essential . Chronic osteomyelitis ([place]) . Pressure injury of skin . CROHN'S DISEASE-LARGE & SMALL INTESTINE . Decubitus ulcer of sacral region, unstageable ([place])   I have reviewed the medical record, interviewed the patient and family, and examined the patient. The following aspects are pertinent.  Past Medical History:  Diagnosis Date  . Atrial flutter ([place])    a. s/p Cardioversion 11/22/13, on amiodarone and Xarelto.  . CHF (congestive heart failure) ([place])   . Chronic osteomyelitis ([place]) 06/30/2019   s/p colostomy  . Chronic systolic heart failure ([place])  a. 10/2013 EF 20-25%, grade III DD, RV mildly dilated and sys fx mild/mod reduced;  b. 01/2014 Echo: EF 30-35%, gr3 DD, mod dil LA. c. 07/2019 EF 20-25%  . Coronary artery disease    a. s/p  MI 2007/2015;  b. s/p prior PCI to the LAD/LCX/PDA/PL;  c. 2008: s/p Cypher DES to the OM.  Marland Kitchen Crohn's ileocolitis ([place])   . GERD (gastroesophageal reflux disease)   . Hx of adenomatous colonic polyps 11/2003  . Hyperlipidemia   . Hypertension   . Hypotension   . Ischemic cardiomyopathy    s. 01/2014 s/p MDT [id number] [name] XT DR single lead AICD.  Marland Kitchen Obesity   . Paroxysmal atrial fibrillation (H[facility])    a. CHA2DS2VASc = (CHF, HTN, agex1, DM)  . Sleep apnea   . Syncope    a.  11/2013 in setting of volume depletion and bradycardia due to dig toxicity   . Type II diabetes mellitus ([place])    Social History   Socioeconomic History  . Marital status: Widowed    Spouse name: Not on file  . Number of children: 1  . Years of education: Not on file  . Highest education level: Not on file  Occupational History  . Occupation: retired    Fish farm manager: RETIRED  Tobacco Use  . Smoking status: Never Smoker  . Smokeless tobacco: Never Used  Substance and Sexual Activity  . Alcohol use: No  . Drug use: No  . Sexual activity: Never  Other Topics Concern  . Not on file  Social History Narrative  . Not on file   Social Determinants of Health   Financial Resource Strain: Low Risk   . Difficulty of Paying Living Expenses: Not hard at all  Food Insecurity: No Food Insecurity  . Worried About Charity fundraiser in the Last Year: Never true  . Ran Out of Food in the Last Year: Never true  Transportation Needs: No Transportation Needs  . Lack of Transportation (Medical): No  . Lack of Transportation (Non-Medical): No  Physical Activity: Inactive  . Days of Exercise per Week: 0 days  . Minutes of Exercise per Session: 0 min  Stress:   . Feeling of Stress : Not on file  Social Connections: Moderately Isolated  . Frequency of Communication with Friends and Family: Never  . Frequency of Social Gatherings with Friends and Family: Never  . Attends Religious Services: Never  . Active Member of Clubs or  Organizations: Yes  . Attends Archivist Meetings: 1 to 4 times per year  . Marital Status: Never married   Family History  Problem Relation Age of Onset  . Breast cancer Mother   . Heart disease Father   . Heart attack Father   . Colon cancer Neg Hx    Scheduled Meds: . ascorbic acid  500 mg Oral BID  . aspirin EC  81 mg Oral Daily  . atorvastatin  80 mg Oral Daily  . balsalazide  750 mg Oral TID  . collagenase  1 application Topical Daily  . digoxin  0.125 mg Oral Daily  . finasteride  5 mg Oral Daily  . insulin aspart  0-5 Units Subcutaneous QHS  . insulin aspart  0-9 Units Subcutaneous TID WC  . insulin aspart  5 Units Subcutaneous TID AC  . lactobacillus  1 g Oral TID WC  . metroNIDAZOLE  500 mg Oral Q8H  . midodrine  10 mg Oral TID  . multivitamin-lutein  1  capsule Oral Daily  . niacin  1,000 mg Oral QHS  . pantoprazole  40 mg Oral Daily  . rivaroxaban  20 mg Oral Q lunch  . sertraline  25 mg Oral Daily  . sodium chloride flush  3 mL Intravenous Q12H  . tamsulosin  0.4 mg Oral QPC breakfast   Continuous Infusions: . sodium chloride 250 mL (09/27/19 0002)  . ceFEPime (MAXIPIME) IV 2 g (09/27/19 0902)   PRN Meds:.sodium chloride, acetaminophen **OR** acetaminophen, albuterol, bisacodyl, HYDROcodone-acetaminophen, magnesium citrate, ondansetron **OR** ondansetron (ZOFRAN) IV, polyethylene glycol, sodium chloride flush Allergies  Allergen Reactions  . Iodine Other (See Comments)    Shortness of breath, swelling and hives  . Shrimp [Shellfish Allergy] Other (See Comments)    SWELLING    HIVES    SHORTNESS OF BREATH  . Tetracycline Rash   Review of Systems  Constitutional: Positive for activity change, appetite change and fatigue.  Neurological: Positive for weakness.  Psychiatric/Behavioral: Positive for dysphoric mood.    Physical Exam Constitutional:      General: He is not in acute distress. Pulmonary:     Effort: Pulmonary effort is normal. No  respiratory distress.  Abdominal:     Palpations: Abdomen is soft.  Musculoskeletal:     Right lower leg: No edema.     Left lower leg: No edema.  Skin:    General: Skin is warm and dry.  Neurological:     Mental Status: He is alert and oriented to person, place, and time.  Psychiatric:        Mood and Affect: Mood is depressed.     Vital Signs: BP (!) 94/55   Pulse 64   Temp 98.2 F (36.8 C)   Resp 17   Ht _0  (1.803 m)   Wt 108.9 kg   SpO2 100%   BMI 33.47 kg/m  Pain Scale: 0-10 POSS *See Group Information*: 1-Acceptable,Awake and alert Pain Score: 0-No pain   SpO2: SpO2: 100 % O2 Device:SpO2: 100 % O2 Flow Rate: .O2 Flow Rate (L/min): 2 L/min  IO: Intake/output summary:   Intake/Output Summary (Last 24 hours) at 09/27/2019 1631 Last data filed at 09/27/2019 0522 Gross per 24 hour  Intake --  Output 350 ml  Net -350 ml    LBM: Last BM Date: 09/27/19 Baseline Weight: Weight: 108.9 kg Most recent weight: Weight: 108.9 kg     Palliative Assessment/Data: PPS 40%    Time Total: 50 minutes Greater than 50%  of this time was spent counseling and coordinating care related to the above assessment and plan.  Juel Burrow, DNP, AGNP-C Palliative Medicine Team [phone] Pager: [phone]

## 2019-09-27 NOTE — TOC Progression Note (Signed)
Transition of Care [facility]) - Progression Note    Patient Details  Name: Colton Lamb MRN: [id number] Date of Birth: May 04, 1951  Transition of Care [facility]) CM/SW Contact  Shelbie Hutching, RN Phone Number: 09/27/2019, 4:11 PM  Clinical Narrative:    Patient agrees to go home with home health and a hospital bed.  Hospital bed ordered and will be delivered by [employer]- bed requires insurance authorization.  Patient will not need IV antibiotics at home and will be able to be managed with oral antibiotics.     Expected Discharge Plan: Long Barriers to Discharge: Continued Medical Work up  Expected Discharge Plan and Services Expected Discharge Plan: [place]   Discharge Planning Services: CM Consult Post Acute Care Choice: [place] arrangements for the past 2 months: [place]: RN, PT, OT, Nurse's Aide, Social Work [facility] Agency: [facility] [place] Date [place]: 09/27/19 Time [place]: 1400 Representative spoke with at [place]: [place] ([place]) Interventions    Readmission Risk Interventions Readmission Risk Prevention Plan 07/18/2019 07/12/2019 06/14/2019  Transportation Screening Complete Complete Complete  Social Work Consult for [place] - - -  Medication Review Press photographer) Complete Complete Complete  PCP or Specialist appointment within 3-5 days of discharge (No Data) - Complete  HRI or [place] - Not Complete -  HRI or Home Care Consult Pt Refusal Comments return to SNF Return to SNF -  SW Recovery Care/Counseling Consult - Complete Complete  Palliative Care Screening - Not Applicable Not Applicable  Skilled Nursing Facility Complete Complete Complete  Some recent data might be hidden

## 2019-09-28 ENCOUNTER — Inpatient Hospital Stay
Admit: 2019-09-28 | Discharge: 2019-09-28 | Disposition: A | Discharge status: Home / Self Care | Payer: Medicare Other | Attending: Infectious Diseases | Admitting: Infectious Diseases

## 2019-09-28 LAB — BASIC METABOLIC PANEL
Anion gap: 8 (ref 5–15)
BUN: 38 mg/dL — ABNORMAL HIGH (ref 8–23)
CO2: 26 mmol/L (ref 22–32)
Calcium: 8.6 mg/dL — ABNORMAL LOW (ref 8.9–10.3)
Chloride: 103 mmol/L (ref 98–111)
Creatinine, Ser: 0.85 mg/dL (ref 0.61–1.24)
GFR calc Af Amer: >60 mL/min (ref >60)
GFR calc non Af Amer: >60 mL/min (ref >60)
Glucose, Bld: 144 mg/dL — ABNORMAL HIGH (ref 70–99)
Potassium: 3.9 mmol/L (ref 3.5–5.1)
Sodium: 137 mmol/L (ref 135–145)

## 2019-09-28 LAB — CBC
HCT: 26.1 % — ABNORMAL LOW (ref 39.0–52.0)
Hemoglobin: 8.1 g/dL — ABNORMAL LOW (ref 13.0–17.0)
MCH: 25.5 pg — ABNORMAL LOW (ref 26.0–34.0)
MCHC: 31 g/dL (ref 30.0–36.0)
MCV: 82.1 fL (ref 80.0–100.0)
Platelets: 179 10*3/uL (ref 150–400)
RBC: 3.18 MIL/uL — ABNORMAL LOW (ref 4.22–5.81)
RDW: 19.3 % — ABNORMAL HIGH (ref 11.5–15.5)
WBC: 22.8 10*3/uL — ABNORMAL HIGH (ref 4.0–10.5)
nRBC: 0.1 % (ref 0.0–0.2)

## 2019-09-28 LAB — CULTURE, BLOOD (ROUTINE X 2)

## 2019-09-28 LAB — ECHOCARDIOGRAM COMPLETE
Height: 71 in
Weight: 3840 oz

## 2019-09-28 LAB — GENTAMICIN LEVEL, TROUGH: Gentamicin Trough: 8.5 ug/mL (ref 0.5–2.0)

## 2019-09-28 LAB — GLUCOSE, CAPILLARY
Glucose-Capillary: 120 mg/dL — ABNORMAL HIGH (ref 70–99)
Glucose-Capillary: 156 mg/dL — ABNORMAL HIGH (ref 70–99)
Glucose-Capillary: 125 mg/dL — ABNORMAL HIGH (ref 70–99)
Glucose-Capillary: 133 mg/dL — ABNORMAL HIGH (ref 70–99)

## 2019-09-28 LAB — GENTAMICIN LEVEL, PEAK: Gentamicin Pk: 13.3 ug/mL (ref 5.0–10.0)

## 2019-09-28 MED ORDER — GENTAMICIN SULFATE 40 MG/ML IJ SOLN
300.0000 mg | INTRAVENOUS | Status: DC
  Administered 2019-09-29: 22:00:00 300 mg via INTRAVENOUS
  Filled 2019-09-28: qty 7.5

## 2019-09-28 MED ORDER — CHLORHEXIDINE GLUCONATE CLOTH 2 % EX PADS
6.0000 | MEDICATED_PAD | Freq: Every day | CUTANEOUS | Status: DC
  Administered 2019-09-28 – 2019-10-02 (×5): 6 via TOPICAL

## 2019-09-28 NOTE — Consult Note (Signed)
Pharmacy Antibiotic Note  Colton Lamb is a 69 y.o. male admitted on 09/23/2019 with bacteremia.  Pharmacy has been consulted for gentamicin dosing. Pseudomonas bacteremia- concern for endocarditis because of AICD.   Plan: Day 6 of abx. Gentamicin added on 2/26 (day 5). Renal function stable.  Will continue gentamicin 200 mg IV q8h.Peak was ordered for 1500 and trough at 2130. Goal peak ~ 8 and trough < 2 (ideally < 1). However, gentamicin given @ 1341 and level drawn at 1529. Therefore, will order trough to be drawn at 2100.   Height: 5' 11"  (180.3 cm) Weight: 240 lb (108.9 kg) IBW/kg (Calculated) : 75.3  Temp (24hrs), Avg:98 F (36.7 C), Min:97.7 F (36.5 C), Max:98.3 F (36.8 C)  Recent Labs  Lab 09/23/19 1703 09/23/19 1723 09/23/19 1903 09/23/19 2146 09/23/19 2307 09/24/19 0138 09/25/19 0446 09/26/19 0401 09/27/19 0035 09/28/19 0525 09/28/19 1529  WBC  --    < >  --   --   --  24.6* 17.9* 20.1* 24.2* 22.8*  --   CREATININE  --    < >  --   --   --  1.42* 1.06 0.88 0.88 0.85  --   LATICACIDVEN 1.9  --  2.3* 1.8 1.4 1.3  --   --   --   --   --   GENTPEAK  --   --   --   --   --   --   --   --   --   --  13.3*   < > = values in this interval not displayed.    Estimated Creatinine Clearance: 104.4 mL/min (by C-G formula based on SCr of 0.85 mg/dL).    Allergies  Allergen Reactions  . Iodine Other (See Comments)    Shortness of breath, swelling and hives  . Shrimp [Shellfish Allergy] Other (See Comments)    SWELLING    HIVES    SHORTNESS OF BREATH  . Tetracycline Rash    Antimicrobials this admission: 2/22 cefepime >>  2/26 gentamicin >>   Dose adjustments this admission: None  Microbiology results: 2/22 BCx: PSA pan-sensitive  2/24 UCx: NGTD  2/25 Wound culture: MODERATE PROTEUS MIRABILIS, FEW GRAM POSITIVE COCCI, FEW GRAM NEGATIVE RODS, RARE GRAM POSITIVE RODS  2/24 MRSA PCR: negative  Thank you for allowing pharmacy to be a part of this patient's  care.  Rowland Lathe, PharmD 09/28/2019 4:59 PM

## 2019-09-28 NOTE — Evaluation (Signed)
Physical Therapy Evaluation Patient Details Name: Colton Lamb MRN: [id number] DOB: [birth year] Today's Date: 09/28/2019   History of Present Illness  Per MD note: 69 y.o. 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  Also found to be COVID positive. Being treated for pseudomonas bacteremia and possible acute on chronic osteomyelitis. Patient has multiple pressure ulcers present on arrival. Pt well known to therapy and reports he hasn't ambulated in almost 1 year. Has been total care at [facility].  Clinical Impression  Pt is a pleasant 69 year old male who was admitted for multiple co-morbidities and also covid+. History of extensive sacral wound, currently on rotating pressure relieving bed. Pt performs bed mobility with mod assist and is currently unable to perform OOB for transfers/ambulation. Reports prolonged bed bound status. Pt demonstrates deficits with strength/mobility/endurance. Educated on importance of movement/OOB mobility as he wishes to pursue home discharge. At this time he would need 24/7 caregivers along with below mentioned equipment to be successful. Would benefit from skilled PT to address above deficits and promote optimal return to PLOF; recommend transition to STR upon discharge from acute hospitalization.     Follow Up Recommendations SNF(pt prefers/hopeful for home dc)    Equipment Recommendations  Hospital bed(lift equipment)    Recommendations for Other Services       Precautions / Restrictions Precautions Precautions: Fall Restrictions Weight Bearing Restrictions: No      Mobility  Bed Mobility Overal bed mobility: Needs Assistance Bed Mobility: Rolling Rolling: Mod assist         General bed mobility comments: able to attempt rolling towards R side  with assist for hand placement on railing and cues for sequencing. Pt reports pain with movement and returns back to supine, unable to tolerate sidelying for more than a few seconds. Unwilling to attempt rolling to L side.  Transfers                 General transfer comment: unable  Ambulation/Gait                Stairs            Wheelchair Mobility    Modified Rankin (Stroke Patients Only)       Balance Overall balance assessment: (unable to assess at this time)                                           Pertinent Vitals/Pain Pain Assessment: No/denies pain    Home Living Family/patient expects to be discharged to:: Private residence Living Arrangements: Alone Available Help at Discharge: Family;Available PRN/intermittently(Pt reports sister and SIL will be available) Type of Home: Mobile home Home Access: Stairs to enter Entrance Stairs-Rails: Right Entrance Stairs-Number of Steps: 3 Home Layout: One level Home Equipment: Walker - 2 wheels;Hand held shower head;Wheelchair - manual Additional Comments: Per chart review, family is working on getting 24/7 care set up    Prior Function Level of [place]: Needs assistance         Comments: reports he hasn't ambulated or got OOB in almost 1 year. Has been reliant on staff for all needs     Hand Dominance        Extremity/Trunk Assessment  Upper Extremity Assessment Upper Extremity Assessment: Generalized weakness(B UE grossly 4/5)    Lower Extremity Assessment Lower Extremity Assessment: Generalized weakness(B LE grossly 3/5)       Communication   Communication: No difficulties  Cognition Arousal/Alertness: Awake/alert Behavior During Therapy: WFL for tasks assessed/performed Overall Cognitive Status: Within Functional Limits for tasks assessed                                 General Comments: very pleasant and agreeable to work with therapy       General Comments      Exercises Other Exercises Other Exercises: supine ther-ex performed on B LE including SLR, resisted heel slides, quad sets, and resisted elbow flexion/extension. All ther-ex performed x 10 reps with cga. Pt follow commands well for participation.   Assessment/Plan    PT Assessment Patient needs continued PT services  PT Problem List Decreased strength;Decreased activity tolerance;Decreased mobility;Pain       PT Treatment Interventions DME instruction;Therapeutic exercise;Balance training;Therapeutic activities;Wheelchair mobility training    PT Goals (Current goals can be found in the Care Plan section)  Acute Rehab PT Goals Patient Stated Goal: to go home PT Goal Formulation: With patient Time For Goal Achievement: 10/12/19 Potential to Achieve Goals: Fair    Frequency Min 2X/week   Barriers to discharge Inaccessible home environment;Decreased caregiver support prolonged bedbound status    Co-evaluation               AM-PAC PT "6 Clicks" Mobility  Outcome Measure Help needed turning from your back to your side while in a flat bed without using bedrails?: A Lot Help needed moving from lying on your back to sitting on the side of a flat bed without using bedrails?: Total Help needed moving to and from a bed to a chair (including a wheelchair)?: Total Help needed standing up from a chair using your arms (e.g., wheelchair or bedside chair)?: Total Help needed to walk in hospital room?: Total Help needed climbing 3-5 steps with a railing? : Total 6 Click Score: 7    End of Session   Activity Tolerance: Patient tolerated treatment well Patient left: in bed(pressure relieving bed) Nurse Communication: Mobility status PT Visit Diagnosis: Muscle weakness (generalized) (M62.81);Difficulty in walking, not elsewhere classified (R26.2)    Time: 2536-6440 PT Time Calculation (min) (ACUTE ONLY): 21 min   Charges:   PT Evaluation $PT Eval High  Complexity: 1 High PT Treatments $Therapeutic Exercise: 8-22 mins        [name], PT, DPT [phone]   Melika Reder 09/28/2019, 3:58 PM

## 2019-09-28 NOTE — Consult Note (Signed)
Pharmacy Antibiotic Note  Colton Lamb is a 69 y.o. male admitted on 09/23/2019 with bacteremia.  Pharmacy has been consulted for gentamicin dosing. Pseudomonas bacteremia- concern for endocarditis because of AICD.   Plan: Day 6 of abx. Gentamicin added on 2/26 (day 5). Renal function stable.  Will continue gentamicin 200 mg IV q8h. Will draw peak today at 1500 and trough at 2130. Goal peak ~ 8 and trough < 2 (ideally < 1).   Continue on cefepime 2 g q8H    Height: 5' 11"  (180.3 cm) Weight: 240 lb (108.9 kg) IBW/kg (Calculated) : 75.3  Temp (24hrs), Avg:98.3 F (36.8 C), Min:98.3 F (36.8 C), Max:98.3 F (36.8 C)  Recent Labs  Lab 09/23/19 1703 09/23/19 1723 09/23/19 1903 09/23/19 2146 09/23/19 2307 09/24/19 0138 09/25/19 0446 09/26/19 0401 09/27/19 0035 09/28/19 0525  WBC  --    < >  --   --   --  24.6* 17.9* 20.1* 24.2* 22.8*  CREATININE  --    < >  --   --   --  1.42* 1.06 0.88 0.88 0.85  LATICACIDVEN 1.9  --  2.3* 1.8 1.4 1.3  --   --   --   --    < > = values in this interval not displayed.    Estimated Creatinine Clearance: 104.4 mL/min (by C-G formula based on SCr of 0.85 mg/dL).    Allergies  Allergen Reactions  . Iodine Other (See Comments)    Shortness of breath, swelling and hives  . Shrimp [Shellfish Allergy] Other (See Comments)    SWELLING    HIVES    SHORTNESS OF BREATH  . Tetracycline Rash    Antimicrobials this admission: 2/22 cefepime >>  2/26 gentamicin >>   Dose adjustments this admission: None  Microbiology results: 2/22 BCx: PSA pan-sensitive  2/24 UCx: NGTD  2/25 Wound culture: MODERATE PROTEUS MIRABILIS, FEW GRAM POSITIVE COCCI, FEW GRAM NEGATIVE RODS, RARE GRAM POSITIVE RODS  2/24 MRSA PCR: negative  Thank you for allowing pharmacy to be a part of this patient's care.  Tawnya Crook, PharmD 09/28/2019 9:18 AM

## 2019-09-28 NOTE — Progress Notes (Signed)
CRITICAL VALUE ALERT  Critical Value:  Gentamicin trough 8.5  Date & Time Notied:  09/28/2019  Provider Notified: Sharion Settler, NP  Orders Received/Actions taken: Due dose not given d/t contraindication. Dosage adjustment made via Pharmacist/ Pharmacy consult. New peak and trough levels orders in coordination with new dosage order.

## 2019-09-28 NOTE — Progress Notes (Signed)
Nursing staff continues to encourage pt to be as mobile as possible, continue encouraging leg movements and exercises in bed, pt states understanding of need to be more mobile but doesn't seem to want to put in much effort, will continue to encourage pt, PT also aware of this need for more mobility

## 2019-09-28 NOTE — Progress Notes (Signed)
PROGRESS NOTE    Colton Lamb  TSV:[id number] DOB: 07/23/1951 DOA: 09/23/2019 PCP: Colton Pitch, MD      Brief Narrative:  Colton Lamb is a 69 y.o. 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  Patient had routine labwork at SNF that showed Na 129 and WBC 24K.  In the ER, lactate 2.3 and CXR showed pneumonia and COVID PCR positive.  Started on broad spectrum antibiotics and admitted.         Assessment & Plan:  Pseudomonas bacteremia Sepsis Possible acute on chronic osteomyelitis Possible endocarditis  Patient sent from facility for leukocytosis.  In ER, met SIRS criteria based on mild tachycardia and leukocytosis, as well as blood pressure less than 100.    2 of 2 blood cultures growing Pseudomonas on admission. Bone biopsy obtained, growing Proteus, possibly anaerobe.  WBC initially improving, but subsequently rising.  Hemodynamically stable.  Repeat blood cultures negative at 24 hours.   ID have concern for ICD infection or endocarditis, have requested TTE and if negative TEE.  Currently, if echocardiography and repeat cultures negative, ID favor 14 days treatment for Pseudomonas bacteremia followed by Augmentin suppressive therapy for osteomyelitis.  Obviously if echocardiography showed vegetation, would recommend prolonged dual therapy.  -Continue cefepime, add gentamicin -Daily BMP -Continue Flagyl until anaerobe bone culture returns  -Consult infectious disease, appreciate expertise -- Colton Lamb see Monday  -Wound offloading, air mattress, wound care/Aquacel  Gen Surg to place wound vac Monday and arrange outpaitent follow up with Colton Lamb-If able to consistently offload the  wound, Gen Surg to make outpatient plastic surgery consultation for skin flap  -Has 4-6 week follow up with Colton Lamb -Will need Colton Lamb for wound vac, possibly for OPAT    COVID-19 without hypoxic respiratory failure Presented with incidental COVID+ PCR in the context of the ongoing 2020-2021 COVID-19 pandemic.  Possible atelectasis on CXR, no hypoxia or respiratory symptoms.  In the absence of significant respiratory disease, the utility of remdesivir is unclear, and possible harm from steroid use given his osteomyelitis likely outweighs benefit.   -Keep on isolation until Mar 4  Doubted Cdiff colitis Stool testing showed toxinogenic cdiff. ID have evaluated and we both feel colitis is unlikely -- no treatment necessary.  AKI Hyponatremia Due to dehydration.  Cr 1.4 on admission, up from baseline 1.0.  Cr now normal. -Monitor Cr on gentamicin  Chronic systolic CHF BP soft, no fluid overload -Hold torsemide  Atrial flutter, typical HR controlled -Continue Xarelto, digoxin -Hold metoprolol given hypotension  Coronary disease secondary prevention -Continue aspirin and Xarelto and atorvastatin -Hold metoprolol  Diabetes Glucose well controlled -Continue SS and mealtime insulin  Crohn's disease   -Continue balsalazide  Anemia of chronic disease Recent baseline appears to be 7-9 g/dL, trended down but still stable within range, no clinical bleeding.  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            Disposition: The patient was admitted with abnormal labs, found to have early sepsis from pelvic osteo.    At present we are awaiting final bone culture.  In addition ID are recommending work-up for endocarditis or ICD lead infection.  Once bone culture has finalized, and ID are confident in ruling in or out endocarditis/lead infection, we will arrange a plan for outpatient antibiotics, and discharge likely to home with general surgery follow-up, wound VAC, home health wound VAC nurse, home health PT, family support, ID follow-up, and pressure offloading bed.      MDM: The below labs and imaging reports reviewed and summarized above.  Medication management as above.    DVT prophylaxis: N/A on Xarelto Code Status: FULL Family Communication: Sister Colton Lamb by phone    Consultants:   Infectious disease  General Surgery   Procedures:   2/23 CT pelvis -- osteomyelitis  2/25 bone biopsy  Antimicrobials:   Vancomycin last dose 2/23 at 2200  Ceftriaxone x1 on 2/23  Cefepime 2/22 >>  Azithromycin last dose on 2/23   Oral vancomycin 2/25 >>  Culture data:   2/22 blood culture -  Pseudomonas in 2/2  Urine culture No growth  2/25 bone biopsy -- proteus, reincubated for anaerobes  2/26 repeat blood cultures x2 -- NGTD          Subjective: Patient is tired, somewhat nauseated.  Very fatigued.  No confusion, fever, cough, chest pain, vomiting, abdominal pain, tenderness.          Objective: Vitals:   09/27/19 0857 09/27/19 1315 09/27/19 1701 09/28/19 0750  BP: 98/65 (!) 94/55 100/63 (!) 107/50  Pulse: 64   84  Resp:    18  Temp:    98.3 F (36.8 C)  TempSrc:    Oral  SpO2:    100%  Weight:      Height:        Intake/Output Summary (Last 24 hours) at 09/28/2019 1351 Last data filed at 09/28/2019 0800 Gross per 24 hour  Intake 1342.85 ml  Output 700 ml  Net 642.85 ml   Filed Weights   09/23/19 1629  Weight: 108.9 kg     Examination: General appearance: Cachectic elderly disheveled adult male, sleeping but easily arouses and in no acute distress.   HEENT: Anicteric, conjunctiva pink, lids and lashes normal. No nasal deformity, discharge, epistaxis.  Lips moist, edentulous, oropharynx moist, no oral lesions, hearing normal   Skin: Warm and dry.  No suspicious rashes or lesions.  Stage IV pressure ulcer sacrum, no surrounding infection. Cardiac: RRR, no murmurs appreciated.  No LE edema.    Respiratory: Normal respiratory rate and rhythm.  CTAB without rales or wheezes. Abdomen: Abdomen soft.  No tenderness palpation or guarding, no rigidity or rebound. No ascites, distension, hepatosplenomegaly.   MSK: No deformities or effusions of the large joints of the upper or lower extremities bilaterally.  Severe loss of subcutaneous muscle mass and fat Neuro: Awake and alert. Naming is grossly intact, and the patient's recall, recent and remote, as well as general fund of knowledge seem within normal limits.  Muscle tone flaccid, without fasciculations.  Moves all extremities equally and with normal coordination but severe generalized weakness.  Speech fluent.    Psych: Sensorium intact and responding to questions, attention normal. Affect normal.  Judgment and insight appear normal.        Data Reviewed: I have  personally reviewed following labs and imaging studies:  CBC: Recent Labs  Lab 09/23/19 1723 09/23/19 1723 09/24/19 0138 09/25/19 0446 09/26/19 0401 09/27/19 0035 09/28/19 0525  WBC 29.7*   < > 24.6* 17.9* 20.1* 24.2* 22.8*  NEUTROABS 25.7*  --  21.2*  --   --   --   --   HGB 10.9*   < > 9.6* 8.8* 9.6* 8.0* 8.1*  HCT 35.2*   < > 31.1* 28.1* 31.7* 26.1* 26.1*  MCV 81.3   < > 81.4 80.5 81.7 81.3 82.1  PLT 333   < > 278 216 244 194 179   < > = values in this interval not displayed.   Basic Metabolic Panel: Recent Labs  Lab 09/23/19 1740 09/23/19 1740 09/24/19 0138 09/25/19 0446  09/26/19 0401 09/27/19 0035 09/28/19 0525  NA 134*   < > 128* 136 139 135 137  K 3.5   < > 3.8 3.5 3.4* 3.7 3.9  CL 85*   < > 84* 96* 98 99 103  CO2 32   < > 31 29 30 28 26   GLUCOSE 172*   < > 159* 266* 131* 151* 144*  BUN 50*   < > 48* 41* 39* 38* 38*  CREATININE 1.43*   < > 1.42* 1.06 0.88 0.88 0.85  CALCIUM 9.0   < > 7.9* 8.4* 9.0 8.6* 8.6*  MG 1.8  --   --  2.1 2.0 1.8  --   PHOS 2.9  --   --   --   --   --   --    < > = values in this interval not displayed.   GFR: Estimated Creatinine Clearance: 104.4 mL/min (by C-G formula based on SCr of 0.85 mg/dL). Liver Function Tests: Recent Labs  Lab 09/23/19 1740 09/27/19 0035  AST 24 18  ALT 10 11  ALKPHOS 137* 106  BILITOT 0.8 0.6  PROT 7.3 5.6*  ALBUMIN 2.5* 1.8*   No results for input(s): LIPASE, AMYLASE in the last 168 hours. No results for input(s): AMMONIA in the last 168 hours. Coagulation Profile: Recent Labs  Lab 09/24/19 0138  INR 2.5*   Cardiac Enzymes: No results for input(s): CKTOTAL, CKMB, CKMBINDEX, TROPONINI in the last 168 hours. BNP (last 3 results) No results for input(s): PROBNP in the last 8760 hours. HbA1C: No results for input(s): HGBA1C in the last 72 hours. CBG: Recent Labs  Lab 09/27/19 1117 09/27/19 1631 09/27/19 2109 09/28/19 0747 09/28/19 1122  GLUCAP 106* 138* 131* 120* 156*   Lipid Profile: No results for input(s): CHOL, HDL, LDLCALC, TRIG, CHOLHDL, LDLDIRECT in the last 72 hours. Thyroid Function Tests: No results for input(s): TSH, T4TOTAL, FREET4, T3FREE, THYROIDAB in the last 72 hours. Anemia Panel: No results for input(s): VITAMINB12, FOLATE, FERRITIN, TIBC, IRON, RETICCTPCT in the last 72 hours. Urine analysis:    Component Value Date/Time   COLORURINE AMBER (A) 09/23/2019 1817   APPEARANCEUR TURBID (A) 09/23/2019 1817   LABSPEC 1.013 09/23/2019 1817   PHURINE 5.0 09/23/2019 1817   GLUCOSEU NEGATIVE 09/23/2019 1817   HGBUR NEGATIVE 09/23/2019 1817   BILIRUBINUR  NEGATIVE 09/23/2019 1817   KETONESUR NEGATIVE 09/23/2019 1817   PROTEINUR NEGATIVE 09/23/2019 1817   UROBILINOGEN 1.0 11/27/2013 1017   NITRITE NEGATIVE 09/23/2019 1817   LEUKOCYTESUR LARGE (A) 09/23/2019 1817   Sepsis Labs: @LABRCNTIP (procalcitonin:4,lacticacidven:4)  ) Recent Results (from the past 240 hour(s))  Culture, blood (routine x 2)     Status: Abnormal   Collection  Time: 09/23/19  8:33 PM   Specimen: BLOOD  Result Value Ref Range Status   Specimen Description BLOOD RIGHT HAND  Final   Special Requests   Final    BOTTLES DRAWN AEROBIC AND ANAEROBIC Blood Culture results may not be optimal due to an inadequate volume of blood received in culture bottles   Culture  Setup Time   Final    Organism ID to follow ANAEROBIC BOTTLE ONLY GRAM NEGATIVE RODS CRITICAL RESULT CALLED TO, READ BACK BY AND VERIFIED WITH: DAVID BESANTI AT 0600 ON 09/26/2019 [place]. Performed at [facility], [address] [place], [place] [address]    Culture (A)  Final    PSEUDOMONAS AERUGINOSA SUSCEPTIBILITIES PERFORMED ON PREVIOUS CULTURE WITHIN THE LAST 5 DAYS.    Report Status 09/28/2019 FINAL  Final  Culture, blood (routine x 2)     Status: Abnormal   Collection Time: 09/23/19  8:33 PM   Specimen: BLOOD  Result Value Ref Range Status   Specimen Description   Final    BLOOD RIGHT FOREARM Performed at [facility], [address] [place], [place] [address]    Special Requests   Final    BOTTLES DRAWN AEROBIC AND ANAEROBIC Blood Culture results may not be optimal due to an inadequate volume of blood received in culture bottles Performed at [facility], [address], [place], [place] [address]    Culture  Setup Time   Final    GRAM NEGATIVE RODS ANAEROBIC BOTTLE ONLY CRITICAL VALUE NOTED.  VALUE IS CONSISTENT WITH PREVIOUSLY REPORTED AND CALLED VALUE. Performed at [facility], [place] [address], [place], [place] [address]    Culture PSEUDOMONAS  AERUGINOSA (A)  Final   Report Status 09/27/2019 FINAL  Final   Organism ID, Bacteria PSEUDOMONAS AERUGINOSA  Final      Susceptibility   Pseudomonas aeruginosa - MIC*    CEFTAZIDIME 4 SENSITIVE Sensitive     CIPROFLOXACIN <=0.25 SENSITIVE Sensitive     GENTAMICIN <=1 SENSITIVE Sensitive     IMIPENEM 1 SENSITIVE Sensitive     PIP/TAZO 8 SENSITIVE Sensitive     CEFEPIME 2 SENSITIVE Sensitive     * PSEUDOMONAS AERUGINOSA  Blood Culture ID Panel (Reflexed)     Status: Abnormal   Collection Time: 09/23/19  8:33 PM  Result Value Ref Range Status   Enterococcus species NOT DETECTED NOT DETECTED Final   Listeria monocytogenes NOT DETECTED NOT DETECTED Final   Staphylococcus species NOT DETECTED NOT DETECTED Final   Staphylococcus aureus (BCID) NOT DETECTED NOT DETECTED Final   Streptococcus species NOT DETECTED NOT DETECTED Final   Streptococcus agalactiae NOT DETECTED NOT DETECTED Final   Streptococcus pneumoniae NOT DETECTED NOT DETECTED Final   Streptococcus pyogenes NOT DETECTED NOT DETECTED Final   Acinetobacter baumannii NOT DETECTED NOT DETECTED Final   Enterobacteriaceae species NOT DETECTED NOT DETECTED Final   Enterobacter cloacae complex NOT DETECTED NOT DETECTED Final   Escherichia coli NOT DETECTED NOT DETECTED Final   Klebsiella oxytoca NOT DETECTED NOT DETECTED Final   Klebsiella pneumoniae NOT DETECTED NOT DETECTED Final   Proteus species NOT DETECTED NOT DETECTED Final   Serratia marcescens NOT DETECTED NOT DETECTED Final   Carbapenem resistance NOT DETECTED NOT DETECTED Final   Haemophilus influenzae NOT DETECTED NOT DETECTED Final   Neisseria meningitidis NOT DETECTED NOT DETECTED Final   Pseudomonas aeruginosa DETECTED (A) NOT DETECTED Final    Comment: CRITICAL RESULT CALLED TO, READ BACK BY AND VERIFIED WITH:  DAVID BESANTI AT 0600 ON 09/26/2019 [place].    Candida albicans NOT DETECTED NOT DETECTED Final   Candida glabrata NOT DETECTED NOT DETECTED Final   Candida  krusei NOT DETECTED NOT DETECTED Final   Candida parapsilosis NOT DETECTED NOT DETECTED Final   Candida tropicalis NOT DETECTED NOT DETECTED Final    Comment: Performed at [facility], [place], [place] [address]  SARS CORONAVIRUS 2 (TAT 6-24 HRS) Nasopharyngeal Nasopharyngeal Swab     Status: Abnormal   Collection Time: 09/23/19  9:06 PM   Specimen: Nasopharyngeal Swab  Result Value Ref Range Status   SARS Coronavirus 2 POSITIVE (A) NEGATIVE Final    Comment: RESULT CALLED TO, READ BACK BY AND VERIFIED WITH: Chauncy Passy, RN [facility]) AT [phone] ON 09/24/19 BY C. JESSUP, MT. (NOTE) SARS-CoV-2 target nucleic acids are DETECTED. The SARS-CoV-2 RNA is generally detectable in upper and lower respiratory specimens during the acute phase of infection. Positive results are indicative of the presence of SARS-CoV-2 RNA. Clinical correlation with patient history and other diagnostic information is  necessary to determine patient infection status. Positive results do not rule out bacterial infection or co-infection with other viruses.  The expected result is Negative. Fact Sheet for Patients: SugarRoll.be Fact Sheet for Healthcare Providers: https://www.woods-mathews.com/ This test is not yet approved or cleared by the [place] FDA and  has been authorized for detection and/or diagnosis of SARS-CoV-2 by FDA under an Emergency Use Authorization (EUA). This EUA will remain  in effect (meaning this  test can be used) for the duration of the COVID-19 declaration under Section 564(b)(1) of the Act, 21 U.S.C. section 360bbb-3(b)(1), unless the authorization is terminated or revoked sooner. Performed at [facility], [place] [address] [place], [place] [address]   Urine Culture     Status: None   Collection Time: 09/25/19  7:45 AM   Specimen: Urine, Random  Result Value Ref Range Status   Specimen Description   Final    URINE,  RANDOM Performed at [facility], [address] [place], [place] [address]    Special Requests   Final    NONE Performed at [facility], [address] [place], [place] [address]    Culture   Final    NO GROWTH Performed at [facility], [place] [address], [place], [place] [address]    Report Status 09/26/2019 FINAL  Final  MRSA PCR Screening     Status: None   Collection Time: 09/25/19  8:36 AM   Specimen: Nasopharyngeal  Result Value Ref Range Status   MRSA by PCR NEGATIVE NEGATIVE Final    Comment:        The GeneXpert MRSA Assay (FDA approved for NASAL specimens only), is one component of a comprehensive MRSA colonization surveillance program. It is not intended to diagnose MRSA infection nor to guide or monitor treatment for MRSA infections. Performed at [facility], [address] [place], [place] [address]   C difficile quick scan w PCR reflex     Status: Abnormal   Collection Time: 09/25/19  3:56 PM   Specimen: STOOL  Result Value Ref Range Status   C Diff antigen POSITIVE (A) NEGATIVE Final   C Diff toxin NEGATIVE NEGATIVE Final   C Diff interpretation Results are indeterminate. See PCR results.  Final    Comment: Performed at [facility], [address] [place], [place] [address]  C. Diff by PCR, Reflexed  Status: Abnormal   Collection Time: 09/25/19  3:56 PM  Result Value Ref Range Status   Toxigenic C. Difficile by PCR POSITIVE (A) NEGATIVE Final    Comment: Positive for toxigenic C. difficile with little to no toxin production. Only treat if clinical presentation suggests symptomatic illness. Performed at [facility], [address], [place], [place] [address]   Aerobic/Anaerobic Culture (surgical/deep wound)     Status: None (Preliminary result)   Collection Time: 09/26/19 12:07 PM   Specimen: Bone; Wound  Result Value Ref Range Status   Specimen Description   Final    BONE Performed at  [facility], [address], [place], [place] [address]    Special Requests   Final    NONE Performed at [facility], [address] [place], [place] [address]    Gram Stain   Final    FEW WBC PRESENT, PREDOMINANTLY PMN FEW GRAM POSITIVE COCCI FEW GRAM NEGATIVE RODS RARE GRAM POSITIVE RODS    Culture   Final    MODERATE PROTEUS MIRABILIS CULTURE REINCUBATED FOR BETTER GROWTH HOLDING FOR POSSIBLE ANAEROBE Performed at [facility], [place] [address], [place], [place] [address]    Report Status PENDING  Incomplete   Organism ID, Bacteria PROTEUS MIRABILIS  Final      Susceptibility   Proteus mirabilis - MIC*    AMPICILLIN <=2 SENSITIVE Sensitive     CEFAZOLIN <=4 SENSITIVE Sensitive     CEFEPIME <=0.12 SENSITIVE Sensitive     CEFTAZIDIME <=1 SENSITIVE Sensitive     CEFTRIAXONE <=0.25 SENSITIVE Sensitive     CIPROFLOXACIN <=0.25 SENSITIVE Sensitive     GENTAMICIN <=1 SENSITIVE Sensitive     IMIPENEM 2 SENSITIVE Sensitive     TRIMETH/SULFA <=20 SENSITIVE Sensitive     AMPICILLIN/SULBACTAM <=2 SENSITIVE Sensitive     PIP/TAZO <=4 SENSITIVE Sensitive     * MODERATE PROTEUS MIRABILIS  Culture, blood (Routine X 2) w Reflex to ID Panel     Status: None (Preliminary result)   Collection Time: 09/27/19 12:34 AM   Specimen: Right Antecubital; Blood  Result Value Ref Range Status   Specimen Description RIGHT ANTECUBITAL  Final   Special Requests Blood Culture adequate volume  Final   Culture   Final    NO GROWTH 1 DAY Performed at [facility], [address], [place], [place] [address]    Report Status PENDING  Incomplete  Culture, blood (Routine X 2) w Reflex to ID Panel     Status: None (Preliminary result)   Collection Time: 09/27/19 12:40 AM   Specimen: BLOOD RIGHT HAND  Result Value Ref Range Status   Specimen Description BLOOD RIGHT HAND  Final   Special Requests Blood Culture adequate volume  Final   Culture   Final    NO GROWTH 1  DAY Performed at [facility], [address] [place], [place] [address]    Report Status PENDING  Incomplete         Radiology Studies: No results found.      Scheduled Meds: . ascorbic acid  500 mg Oral BID  . aspirin EC  81 mg Oral Daily  . atorvastatin  80 mg Oral Daily  . balsalazide  750 mg Oral TID  . Chlorhexidine Gluconate Cloth  6 each Topical Q0600  . collagenase  1 application Topical Daily  . digoxin  0.125 mg Oral Daily  . finasteride  5 mg Oral Daily  . insulin aspart  0-5 Units Subcutaneous QHS  . insulin aspart  0-9 Units Subcutaneous TID WC  . insulin aspart  5 Units Subcutaneous TID AC  . lactobacillus  1 g Oral TID WC  . metroNIDAZOLE  500 mg Oral Q8H  . midodrine  10 mg Oral TID  . multivitamin-lutein  1 capsule Oral Daily  . niacin  1,000 mg Oral QHS  . pantoprazole  40 mg Oral Daily  . rivaroxaban  20 mg Oral Q lunch  . sertraline  25 mg Oral Daily  . sodium chloride flush  3 mL Intravenous Q12H  . tamsulosin  0.4 mg Oral QPC breakfast   Continuous Infusions: . sodium chloride Stopped (09/28/19 5852)  . ceFEPime (MAXIPIME) IV 2 g (09/28/19 0957)  . gentamicin 200 mg (09/28/19 1341)     LOS: 5 days    Time spent: 25 minutes    Edwin Dada, MD [facility] 09/28/2019, 1:51 PM     Please page though [place] or Epic secure chat:  For [employer], Adult nurse

## 2019-09-28 NOTE — Progress Notes (Addendum)
Update given to designated contact

## 2019-09-28 NOTE — Consult Note (Signed)
Pharmacy Antibiotic Note  Colton Lamb is a 69 y.o. male admitted on 09/23/2019 with bacteremia.  Pharmacy has been consulted for gentamicin dosing. Pseudomonas bacteremia- concern for endocarditis because of AICD.   Plan: 02/27 gent peak 13.3, gent trough 8.5. Trough is supratherapeutic. Will switch regimen to:  Gentamicin 300 mg IV Q 48 hrs Predicted Cmax: 10.02 mcg/mL Predicted Cmin: 0.32 mcg/mL Ke 0.0724  Goal peak 8 - 10 mcg/mL Goal trough < 2 mcg/mL.  Will check next peak/trough 03/04 @ 2230 and 03/06 @ 2000 respectively and will continue to monitor renal function and adjust as necessary.   Height: 5' 11"  (180.3 cm) Weight: 240 lb (108.9 kg) IBW/kg (Calculated) : 75.3  Temp (24hrs), Avg:98 F (36.7 C), Min:97.7 F (36.5 C), Max:98.3 F (36.8 C)  Recent Labs  Lab 09/23/19 1703 09/23/19 1723 09/23/19 1903 09/23/19 2146 09/23/19 2307 09/24/19 0138 09/25/19 0446 09/26/19 0401 09/27/19 0035 09/28/19 0525 09/28/19 1529 09/28/19 2140  WBC  --    < >  --   --   --  24.6* 17.9* 20.1* 24.2* 22.8*  --   --   CREATININE  --    < >  --   --   --  1.42* 1.06 0.88 0.88 0.85  --   --   LATICACIDVEN 1.9  --  2.3* 1.8 1.4 1.3  --   --   --   --   --   --   GENTTROUGH  --   --   --   --   --   --   --   --   --   --   --  8.5*  GENTPEAK  --   --   --   --   --   --   --   --   --   --  13.3*  --    < > = values in this interval not displayed.    Estimated Creatinine Clearance: 104.4 mL/min (by C-G formula based on SCr of 0.85 mg/dL).    Allergies  Allergen Reactions  . Iodine Other (See Comments)    Shortness of breath, swelling and hives  . Shrimp [Shellfish Allergy] Other (See Comments)    SWELLING    HIVES    SHORTNESS OF BREATH  . Tetracycline Rash    Antimicrobials this admission: 2/22 cefepime >>  2/26 gentamicin >>   Dose adjustments this admission: None  Microbiology results: 2/22 BCx: PSA pan-sensitive  2/24 UCx: NGTD  2/25 Wound culture: MODERATE  PROTEUS MIRABILIS, FEW GRAM POSITIVE COCCI, FEW GRAM NEGATIVE RODS, RARE GRAM POSITIVE RODS  2/24 MRSA PCR: negative  Thank you for allowing pharmacy to be a part of this patient's care.  Tobie Lords, PharmD 09/28/2019 11:33 PM

## 2019-09-29 DIAGNOSIS — I959 Hypotension, unspecified: Secondary | ICD-10-CM

## 2019-09-29 DIAGNOSIS — I4811 Longstanding persistent atrial fibrillation: Secondary | ICD-10-CM

## 2019-09-29 DIAGNOSIS — J80 Acute respiratory distress syndrome: Secondary | ICD-10-CM

## 2019-09-29 DIAGNOSIS — Z515 Encounter for palliative care: Secondary | ICD-10-CM

## 2019-09-29 DIAGNOSIS — L89150 Pressure ulcer of sacral region, unstageable: Secondary | ICD-10-CM

## 2019-09-29 DIAGNOSIS — D849 Immunodeficiency, unspecified: Secondary | ICD-10-CM

## 2019-09-29 DIAGNOSIS — R7881 Bacteremia: Secondary | ICD-10-CM

## 2019-09-29 DIAGNOSIS — U071 COVID-19: Secondary | ICD-10-CM

## 2019-09-29 DIAGNOSIS — A419 Sepsis, unspecified organism: Secondary | ICD-10-CM

## 2019-09-29 LAB — GLUCOSE, CAPILLARY
Glucose-Capillary: 90 mg/dL (ref 70–99)
Glucose-Capillary: 138 mg/dL — ABNORMAL HIGH (ref 70–99)
Glucose-Capillary: 147 mg/dL — ABNORMAL HIGH (ref 70–99)
Glucose-Capillary: 75 mg/dL (ref 70–99)

## 2019-09-29 LAB — CBC
HCT: 26.6 % — ABNORMAL LOW (ref 39.0–52.0)
Hemoglobin: 8 g/dL — ABNORMAL LOW (ref 13.0–17.0)
MCH: 25 pg — ABNORMAL LOW (ref 26.0–34.0)
MCHC: 30.1 g/dL (ref 30.0–36.0)
MCV: 83.1 fL (ref 80.0–100.0)
Platelets: 160 10*3/uL (ref 150–400)
RBC: 3.2 MIL/uL — ABNORMAL LOW (ref 4.22–5.81)
RDW: 19.9 % — ABNORMAL HIGH (ref 11.5–15.5)
WBC: 19.1 10*3/uL — ABNORMAL HIGH (ref 4.0–10.5)
nRBC: 0 % (ref 0.0–0.2)

## 2019-09-29 LAB — CREATININE, SERUM
Creatinine, Ser: 0.74 mg/dL (ref 0.61–1.24)
GFR calc Af Amer: >60 mL/min (ref >60)
GFR calc non Af Amer: >60 mL/min (ref >60)

## 2019-09-29 LAB — BASIC METABOLIC PANEL
Anion gap: 11 (ref 5–15)
BUN: 29 mg/dL — ABNORMAL HIGH (ref 8–23)
CO2: 24 mmol/L (ref 22–32)
Calcium: 8.6 mg/dL — ABNORMAL LOW (ref 8.9–10.3)
Chloride: 103 mmol/L (ref 98–111)
Creatinine, Ser: 0.79 mg/dL (ref 0.61–1.24)
GFR calc Af Amer: >60 mL/min (ref >60)
GFR calc non Af Amer: >60 mL/min (ref >60)
Glucose, Bld: 107 mg/dL — ABNORMAL HIGH (ref 70–99)
Potassium: 3.3 mmol/L — ABNORMAL LOW (ref 3.5–5.1)
Sodium: 138 mmol/L (ref 135–145)

## 2019-09-29 NOTE — Consult Note (Signed)
Cardiology Consultation:   Patient ID: Colton Lamb MRN: [id number]; DOB: 06/16/1951  Admit date: 09/23/2019 Date of Consult: 09/29/2019  Primary Care Provider: [name], MD Primary Cardiologist: Ida Rogue, MD  Physician requesting consult: Norina Buzzard Reason for consult: Bacteremia, need for TEE  Patient Profile:   Colton Lamb is a 69 y.o. 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/p MDT ICD, coronary artery disease, multivessel, prior PCI, persistent atrial fibrillation, chronic hypotension, sacral decubitus ulcer with chronic osteomyelitis, s/p diverting colostomy, long hospital course December 2024 acute hypoxic respiratory distress, decompensation after diverting colostomy surgery, presenting to the hospital September 23, 2019 for elevated white count, low sodium, found to be Covid positive, bacteremia with Pseudomonas  History of Present Illness:   Colton Lamb continues to have sacral decubitus stage IV ulcer Pseudomonas bacteremia of unclear source On cefepime, with worsening leukocytosis Repeat blood cultures pending Bone biopsy cultures have been sent by surgery, growing Proteus  Chest x-ray with pneumonia, Covid positive PCR On arrival with sodium 129 WBC 24,000 (routine lab work done at [facility])  Continues to have hypotension requiring midodrine  He has severe leg muscle atrophy, has not walked in a year  Scheduled for wound VAC placement on Monday  Notes indicating severe protein calorie malnutrition  Very few blood pressure measurements made today but since midnight highest systolic pressure has been 91, repeat systolics in the 68G He has been receiving midodrine 10 mg 3 times daily  Heart Pathway Score:     Past Medical History:  Diagnosis Date  . Atrial flutter ([place])    a. s/p Cardioversion 11/22/13, on amiodarone and Xarelto.  . CHF (congestive heart failure) ([place])   . Chronic osteomyelitis ([place]) 06/30/2019   s/p colostomy  . Chronic systolic heart failure ([place])    a. 10/2013 EF 20-25%, grade III DD, RV mildly dilated and sys fx mild/mod reduced;  b. 01/2014 Echo: EF 30-35%, gr3 DD, mod dil LA. c. 07/2019 EF 20-25%  . Coronary artery disease    a. s/p MI 2007/2015;  b. s/p prior PCI to the LAD/LCX/PDA/PL;  c. 2008: s/p Cypher DES to the OM.  Marland Kitchen Crohn's ileocolitis ([place])   . GERD (gastroesophageal reflux disease)   . Hx of adenomatous colonic polyps 11/2003  . Hyperlipidemia   . Hypertension   . Hypotension   . Ischemic cardiomyopathy    s. 01/2014 s/p MDT [id number] [name] XT DR single lead AICD.  Marland Kitchen Obesity   . Paroxysmal atrial fibrillation (H[facility])    a. CHA2DS2VASc = (CHF, HTN, agex1, DM)  . Sleep apnea   . Syncope    a.  11/2013 in setting of volume depletion and bradycardia due to dig toxicity   . Type II diabetes mellitus ([place])     Past Surgical History:  Procedure Laterality Date  . ATRIAL FLUTTER ABLATION N/A 04/16/2014   Procedure: ATRIAL FLUTTER ABLATION;  Surgeon: Evans Lance, MD;  Location: [facility] CATH LAB;  Service: Cardiovascular;  Laterality: N/A;  . CARDIAC CATHETERIZATION  10/2013  . CARDIAC DEFIBRILLATOR PLACEMENT  04/16/2014   Medtronic Evira device  . CARDIAC ELECTROPHYSIOLOGY STUDY AND ABLATION  04/16/2014   atrial flutter ablation  . CARDIOVERSION N/A 03/05/2014   Procedure: CARDIOVERSION;  Surgeon: Jolaine Artist, MD;  Location: [facility] ENDOSCOPY;  Service: Cardiovascular;  Laterality: N/A;  . CATARACT EXTRACTION W/PHACO Right 01/04/2017   Procedure: CATARACT EXTRACTION PHACO AND INTRAOCULAR LENS PLACEMENT ([place])  Right Diabetic Complicated;  Surgeon:  Leandrew Koyanagi, MD;  Location: [place];  Service: Ophthalmology;  Laterality: Right;  Diabetic  . CATARACT EXTRACTION W/PHACO Left 02/08/2017   Procedure: CATARACT EXTRACTION PHACO AND INTRAOCULAR LENS PLACEMENT ([place]) left diabetic;  Surgeon: Leandrew Koyanagi, MD;  Location: [place];  Service:  Ophthalmology;  Laterality: Left;  Diabetic - oral meds sleep apnea  . CORONARY ANGIOPLASTY WITH STENT PLACEMENT  2007; 2008 X 2   "1+1 ~ 1"  . FOOT SURGERY Left    bone spur  . HYDROCELE EXCISION Bilateral   . 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 IMPLANTABLE CARDIOVERTER DEFIBRILLATOR IMPLANT N/A 04/16/2014   Procedure: IMPLANTABLE CARDIOVERTER DEFIBRILLATOR IMPLANT;  Surgeon: Evans Lance, MD;  Location: [facility] CATH LAB;  Service: Cardiovascular;  Laterality: N/A;  . LEFT HEART CATHETERIZATION WITH CORONARY ANGIOGRAM N/A 11/22/2013   Procedure: LEFT HEART CATHETERIZATION WITH CORONARY ANGIOGRAM;  Surgeon: Sinclair Grooms, MD;  Location: [facility] CATH LAB;  Service: Cardiovascular;  Laterality: N/A;  . TRANSVERSE LOOP COLOSTOMY N/A 07/03/2019   Procedure: TRANSVERSE LOOP COLOSTOMY;  Surgeon: Ronny Bacon, MD;  Location: [facility] ORS;  Service: General;  Laterality: N/A;     Home Medications:  Prior to Admission medications   Medication Sig Start Date End Date Taking? Authorizing Provider  acetaminophen (TYLENOL) 325 MG tablet Take 1-2 tablets (325-650 mg total) by mouth every 4 (four) hours as needed for mild pain. 04/17/14  Yes [name], NP  Albuterol Sulfate 108 (90 Base) MCG/ACT AEPB Inhale 1 puff into the lungs every 6 (six) hours as needed (shortness of breath).    Yes [provider]  aspirin 81 MG tablet Take 81 mg by mouth daily.    Yes [provider]  atorvastatin (LIPITOR) 80 MG tablet TAKE 1 TABLET BY MOUTH EVERY DAY Patient taking differently: Take 80 mg by mouth daily.  03/08/19  Yes Tausha Milhoan, Kathlene November, MD  balsalazide (COLAZAL) 750 MG capsule TAKE 1 CAPSULE (750 MG TOTAL) BY MOUTH 3 (THREE) TIMES DAILY. Patient taking differently: Take 750 mg by mouth 3 (three) times daily.  11/28/18  Yes Ladene Artist, MD  collagenase (SANTYL) ointment Apply 1 application topically daily. (apply to sacrum)   Yes [provider]    digoxin (LANOXIN) 0.125 MG tablet Take 1 tablet (0.125 mg total) by mouth daily. 07/10/19  Yes [name], MD  finasteride (PROSCAR) 5 MG tablet Take 1 tablet (5 mg total) by mouth daily. 03/20/19  Yes Wieting, Richard, MD  HYDROcodone-acetaminophen (NORCO/VICODIN) 5-325 MG tablet Take 1 tablet by mouth every 6 (six) hours as needed for severe pain. 07/09/19  Yes [name], MD  insulin aspart (NOVOLOG) 100 UNIT/ML injection Inject 5 Units into the skin 3 (three) times daily with meals. Patient taking differently: Inject 5 Units into the skin 3 (three) times daily before meals.  03/19/19  Yes Wieting, Richard, MD  ipratropium-albuterol (DUONEB) 0.5-2.5 (3) MG/3ML SOLN Take 3 mLs by nebulization every 4 (four) hours as needed (shortness of breath or wheezing).   Yes [provider]  lactobacillus (FLORANEX/LACTINEX) PACK Take 1 packet (1 g total) by mouth 3 (three) times daily with meals. 03/19/19  Yes Wieting, Richard, MD  metoprolol succinate (TOPROL-XL) 25 MG 24 hr tablet Take 1 tablet (25 mg total) by mouth daily. 07/10/19  Yes [name], MD  midodrine (PROAMATINE) 10 MG tablet Take 1 tablet (10 mg total) by mouth 3 (three) times daily. 04/26/19  Yes Max Sane, MD  Multiple Vitamins-Minerals (DECUBI-VITE PO) Take 1 capsule by mouth daily.    Yes [provider]  niacin (NIASPAN) 1000 MG CR tablet Take 1,000 mg by mouth at bedtime.    Yes [provider]  Nutritional Supplements (FEEDING SUPPLEMENT, NEPRO CARB STEADY,) LIQD Take 237 mLs by mouth 2 (two) times daily between meals. 03/19/19  Yes Wieting, Richard, MD  omeprazole (PRILOSEC) 20 MG capsule Take 20 mg by mouth daily.    Yes [provider]  ondansetron (ZOFRAN) 4 MG tablet Take 4 mg by mouth every 8 (eight) hours as needed for nausea or vomiting.   Yes [provider]  potassium chloride (KLOR-CON) 10 MEQ tablet Take 1 tablet (10 mEq total) by mouth daily. Patient taking differently: Take 10 mEq  by mouth every evening.  05/08/19 09/23/19 Yes Duard Spiewak, Kathlene November, MD  sertraline (ZOLOFT) 25 MG tablet Take 25 mg by mouth daily.   Yes [provider]  tamsulosin (FLOMAX) 0.4 MG CAPS capsule Take 1 capsule (0.4 mg total) by mouth daily after breakfast. 03/20/19  Yes Leslye Peer, Richard, MD  torsemide (DEMADEX) 20 MG tablet Take 2 tablets (40 mg) by mouth once daily 05/08/19  Yes Camrin Lapre, Kathlene November, MD  vitamin C (VITAMIN C) 500 MG tablet Take 1 tablet (500 mg total) by mouth 2 (two) times daily. 07/09/19  Yes [name], MD  XARELTO 20 MG TABS tablet TAKE 1 TABLET BY MOUTH EVERY DAY WITH LUNCH Patient taking differently: Take 20 mg by mouth daily with lunch.  05/17/19  Yes Minna Merritts, MD    Inpatient Medications: Scheduled Meds: . ascorbic acid  500 mg Oral BID  . aspirin EC  81 mg Oral Daily  . atorvastatin  80 mg Oral Daily  . balsalazide  750 mg Oral TID  . Chlorhexidine Gluconate Cloth  6 each Topical Q0600  . collagenase  1 application Topical Daily  . digoxin  0.125 mg Oral Daily  . finasteride  5 mg Oral Daily  . insulin aspart  0-5 Units Subcutaneous QHS  . insulin aspart  0-9 Units Subcutaneous TID WC  . insulin aspart  5 Units Subcutaneous TID AC  . lactobacillus  1 g Oral TID WC  . metroNIDAZOLE  500 mg Oral Q8H  . midodrine  10 mg Oral TID  . multivitamin-lutein  1 capsule Oral Daily  . niacin  1,000 mg Oral QHS  . pantoprazole  40 mg Oral Daily  . rivaroxaban  20 mg Oral Q lunch  . sertraline  25 mg Oral Daily  . sodium chloride flush  3 mL Intravenous Q12H  . tamsulosin  0.4 mg Oral QPC breakfast   Continuous Infusions: . sodium chloride 10 mL/hr at 09/29/19 0300  . ceFEPime (MAXIPIME) IV 2 g (09/29/19 1703)  . gentamicin     PRN Meds: sodium chloride, acetaminophen **OR** acetaminophen, albuterol, bisacodyl, HYDROcodone-acetaminophen, magnesium citrate, ondansetron **OR** ondansetron (ZOFRAN) IV, polyethylene glycol, sodium chloride flush  Allergies:     Allergies  Allergen Reactions  . Iodine Other (See Comments)    Shortness of breath, swelling and hives  . Shrimp [Shellfish Allergy] Other (See Comments)    SWELLING    HIVES    SHORTNESS OF BREATH  . Tetracycline Rash    Social History:   Social History   Socioeconomic History  . Marital status: Widowed    Spouse name: Not on file  . Number of children: 1  . Years of education: Not on file  .  Highest education level: Not on file  Occupational History  . Occupation: retired    Fish farm manager: RETIRED  Tobacco Use  . Smoking status: Never Smoker  . Smokeless tobacco: Never Used  Substance and Sexual Activity  . Alcohol use: No  . Drug use: No  . Sexual activity: Never  Other Topics Concern  . Not on file  Social History Narrative  . Not on file   Social Determinants of Health   Financial Resource Strain: Low Risk   . Difficulty of Paying Living Expenses: Not hard at all  Food Insecurity: No Food Insecurity  . Worried About Charity fundraiser in the Last Year: Never true  . Ran Out of Food in the Last Year: Never true  Transportation Needs: No Transportation Needs  . Lack of Transportation (Medical): No  . Lack of Transportation (Non-Medical): No  Physical Activity: Inactive  . Days of Exercise per Week: 0 days  . Minutes of Exercise per Session: 0 min  Stress:   . Feeling of Stress : Not on file  Social Connections: Moderately Isolated  . Frequency of Communication with Friends and Family: Never  . Frequency of Social Gatherings with Friends and Family: Never  . Attends Religious Services: Never  . Active Member of Clubs or Organizations: Yes  . Attends Archivist Meetings: 1 to 4 times per year  . Marital Status: Never married  Intimate Partner Violence: Unknown  . Fear of Current or Ex-Partner: No  . Emotionally Abused: Not on file  . Physically Abused: No  . Sexually Abused: No    Family History:    Family History  Problem Relation Age of  Onset  . Breast cancer Mother   . Heart disease Father   . Heart attack Father   . Colon cancer Neg Hx      ROS:  Please see the history of present illness.  Review of Systems  Constitutional: Negative.   HENT: Negative.   Respiratory: Negative.   Cardiovascular: Negative.   Gastrointestinal: Negative.   Musculoskeletal: Negative.        Weakness  Neurological: Negative.   Psychiatric/Behavioral: Negative.   All other systems reviewed and are negative.   Physical Exam/Data:   Vitals:   09/29/19 0033 09/29/19 0251 09/29/19 0701 09/29/19 0721  BP: (!) 91/54 (!) 86/49 (!) 80/47 (!) 83/56  Pulse: 69 67 65 61  Resp: 18 20 18 18   Temp: 97.6 F (36.4 C)   98.2 F (36.8 C)  TempSrc:      SpO2: 100% 99% 100% 100%  Weight:      Height:        Intake/Output Summary (Last 24 hours) at 09/29/2019 1821 Last data filed at 09/29/2019 0300 Gross per 24 hour  Intake 196.3 ml  Output --  Net 196.3 ml   Last 3 Weights 09/23/2019 07/11/2019 07/11/2019  Weight (lbs) 240 lb 245 lb 6 oz 237 lb  Weight (kg) 108.863 kg 111.3 kg 107.502 kg     Body mass index is 33.47 kg/m.  General: Muscle atrophy, supine in bed lying on his side, no acute distress HEENT: normal Lymph: no adenopathy Neck: no JVD Endocrine:  No thryomegaly Vascular: No carotid bruits;  Cardiac: Irregularly irregular, no murmur Lungs: Clear, scattered Rales Abd: soft, nontender, no hepatomegaly  Ext: no edema Musculoskeletal: Muscle atrophy bilateral lower extremities Skin: warm and dry  Neuro: Full exam not performed Psych: Pleasant, alert  EKG:  The EKG was personally reviewed and  demonstrates:   Atrial fibrillation ventricular rate 94 bpm PVCs, old anterior MI Telemetry:  Telemetry was personally reviewed and demonstrates: Atrial fibrillation  Relevant CV Studies: Echocardiogram yesterday 1. Left ventricular ejection fraction, by estimation, is 25 to 30%. The  left ventricle has severely decreased  function. The left ventricle  demonstrates regional wall motion abnormalities , anterior , anterospetal  and apical wall hypokinesis.  2. Right ventricular systolic function is normal. The right ventricular  size is normal. There is mildly elevated pulmonary artery systolic  pressure.  3. Left atrial size was mildly dilated.  4. No vegetation noted.   Laboratory Data:  High Sensitivity Troponin:  No results for input(s): TROPONINIHS in the last 720 hours.   Chemistry Recent Labs  Lab 09/27/19 0035 09/28/19 0525 09/29/19 0452  NA 135 137 138  K 3.7 3.9 3.3*  CL 99 103 103  CO2 28 26 24   GLUCOSE 151* 144* 107*  BUN 38* 38* 29*  CREATININE 0.88 0.85 0.79  0.74  CALCIUM 8.6* 8.6* 8.6*  GFRNONAA >60 >60 >60  >60  GFRAA >60 >60 >60  >60  ANIONGAP 8 8 11     Recent Labs  Lab 09/23/19 1740 09/27/19 0035  PROT 7.3 5.6*  ALBUMIN 2.5* 1.8*  AST 24 18  ALT 10 11  ALKPHOS 137* 106  BILITOT 0.8 0.6   Hematology Recent Labs  Lab 09/27/19 0035 09/28/19 0525 09/29/19 0452  WBC 24.2* 22.8* 19.1*  RBC 3.21* 3.18* 3.20*  HGB 8.0* 8.1* 8.0*  HCT 26.1* 26.1* 26.6*  MCV 81.3 82.1 83.1  MCH 24.9* 25.5* 25.0*  MCHC 30.7 31.0 30.1  RDW 18.7* 19.3* 19.9*  PLT 194 179 160   BNPNo results for input(s): BNP, PROBNP in the last 168 hours.  DDimer No results for input(s): DDIMER in the last 168 hours.   Radiology/Studies:  ECHOCARDIOGRAM COMPLETE  Result Date: 09/28/2019    ECHOCARDIOGRAM REPORT   Patient Name:   Colton Lamb Date of Exam: 09/28/2019 Medical Rec #:  [id number]        Height:       71.0 in Accession #:    [id number]       Weight:       240.0 lb Date of Birth:  [birth year]        BSA:          2.278 m Patient Age:    52 years         BP:           107/50 mmHg Patient Gender: M                HR:           83 bpm. Exam Location:  [facility] Procedure: 2D Echo Indications:     BACTEREMIA 790.7/R78.81  History:         Patient has prior history of Echocardiogram  examinations, most                  recent 06/25/2019. CAD, Signs/Symptoms:Syncope; Risk                  Factors:Dyslipidemia, Diabetes and Hypertension.  Sonographer:     Avanell Shackleton Referring Phys:  [id number] Tsosie Billing Diagnosing Phys: Ida Rogue MD  Sonographer Comments: Technically difficult study due to poor echo windows, suboptimal parasternal window, suboptimal apical window and Technically challenging study due to limited acoustic windows. THERE IS ALOT OF ARTIFACT PRESENT DUE TO THE INTERFERENCE FROM  THE EQUIPMENT IN THE COVID UNIT IMPRESSIONS  1. Left ventricular ejection fraction, by estimation, is 25 to 30%. The left ventricle has severely decreased function. The left ventricle demonstrates regional wall motion abnormalities , anterior , anterospetal and apical wall hypokinesis.  2. Right ventricular systolic function is normal. The right ventricular size is normal. There is mildly elevated pulmonary artery systolic pressure.  3. Left atrial size was mildly dilated.  4. No vegetation noted. FINDINGS  Left Ventricle: Left ventricular ejection fraction, by estimation, is 25 to 30%. The left ventricle has severely decreased function. The left ventricle demonstrates regional wall motion abnormalities. The left ventricular internal cavity size was normal  in size. There is no left ventricular hypertrophy. Left ventricular diastolic parameters are indeterminate. Right Ventricle: The right ventricular size is normal. No increase in right ventricular wall thickness. Right ventricular systolic function is normal. There is mildly elevated pulmonary artery systolic pressure. The tricuspid regurgitant velocity is 2.66  m/s, and with an assumed right atrial pressure of 10 mmHg, the estimated right ventricular systolic pressure is 56.2 mmHg. Left Atrium: Left atrial size was mildly dilated. Right Atrium: Right atrial size was normal in size. Pericardium: Trivial pericardial effusion is present.  Mitral Valve: The mitral valve is normal in structure and function. Normal mobility of the mitral valve leaflets. No evidence of mitral valve regurgitation. No evidence of mitral valve stenosis. Tricuspid Valve: The tricuspid valve is normal in structure. Tricuspid valve regurgitation is not demonstrated. No evidence of tricuspid stenosis. Aortic Valve: The aortic valve is normal in structure and function. Aortic valve regurgitation is not visualized. No aortic stenosis is present. Pulmonic Valve: The pulmonic valve was normal in structure. Pulmonic valve regurgitation is not visualized. No evidence of pulmonic stenosis. Aorta: The aortic root is normal in size and structure. Venous: The inferior vena cava is normal in size with greater than 50% respiratory variability, suggesting right atrial pressure of 3 mmHg. IAS/Shunts: No atrial level shunt detected by color flow Doppler.  LEFT VENTRICLE PLAX 2D LVIDd:         5.07 cm LVIDs:         4.27 cm LV PW:         0.81 cm LV IVS:        1.04 cm  LEFT ATRIUM         Index LA diam:    4.00 cm 1.76 cm/m   AORTA Ao Root diam: 4.70 cm TRICUSPID VALVE TR Peak grad:   28.3 mmHg TR Vmax:        266.00 cm/s Ida Rogue MD Electronically signed by Ida Rogue MD Signature Date/Time: 09/28/2019/2:38:15 PM    Final      Assessment and Plan:   1. Pseudomonas bacteremia Source unclear, ID has suggested a TEE to evaluate ICD wire, valves --Currently not a good candidate for TEE given profound hypotension systolics 13-08 even on midodrine 10 mg 3 times daily --Would recommend we discuss utility with ID, usually we only visualize small regions of the ICD wire, unable to definitively exclude infection on the device --TEE also more challenging in the setting of new diagnosis of Covid --Given his numerous comorbidities, would likely not be a good candidate for ICD extraction even if vegetation identified -He is bedbound, cachectic/muscle atrophy, has not walked in a  year -Consider palliative consult  2.  Dilated cardiomyopathy, likely ischemic in nature Ejection fraction unchanged, 20 to 25% Not a good candidate for ischemic work-up Unable to add medications  for cardiomyopathy  3.  Hypotension Numbers worse in the setting of Covid, Pseudomonas bacteremia -On midodrine, pressures 44-97 systolic  4. CAD  Multivessel with prior PCI No ischemic work-up at this time  5.  Covid Presumably obtain this from recent stay at nursing facility Likely contributing to hypotension, debility  6.  Diabetes On sliding scale  7.  Atrial fibrillation On Xarelto, digoxin, Unable to add metoprolol secondary to hypotension  Extensive review of the chart  Total encounter time more than 110 minutes  Greater than 50% was spent in counseling and coordination of care with the patient   For questions or updates, please contact [place] Please consult [email or web address] for contact info under     Signed, Ida Rogue, MD  09/29/2019 6:21 PM

## 2019-09-29 NOTE — Progress Notes (Signed)
PROGRESS NOTE    [name]  GUR:[id number] DOB: [birth year] DOA: 09/23/2019 PCP: [name], MD      Brief Narrative:  [name] is a 69 y.o. 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  Patient had routine labwork at SNF that showed Na 129 and WBC 24K.  In the ER, lactate 2.3 and CXR showed pneumonia and COVID PCR positive.  Started on broad spectrum antibiotics and admitted.         Assessment & Plan:  Pseudomonas bacteremia Sepsis Possible acute on chronic osteomyelitis Possible endocarditis  Patient sent from facility for leukocytosis.  In ER, found to have tachycardia and leukocytosis, as well as blood pressure less than 100.  2 of 2 blood cultures growing Pseudomonas on admission. Bone biopsy obtained, growing Proteus, anaerobic culture still pending.  WBC trending down.  Repeat blood cultures negative now at 48 hours.   ID have concern for ICD infection or endocarditis: TTE negative for vegetation, will consult Cardiology for TEE.  Currently, if echocardiography and repeat cultures negative, ID favor 14 days treatment for Pseudomonas bacteremia followed by Augmentin suppressive therapy for osteomyelitis.  Obviously if echocardiography showed vegetation, would recommend prolonged dual therapy.  Will need to consult with Dr. FOla Spurron Monday to confirm  -Continue cefepime and gentamicin -Trough monitoring per RPh -Daily BMP -Continue Flagyl until anaerobe bone culture returns  -Consult infectious disease, appreciate expertise -- Dr. FOla Spurrto see Monday  -Wound offloading, air mattress, wound care/Aquacel  Gen Surg to place wound vac Monday and [facility]-If able to consistently offload the wound, Gen Surg to make outpatient plastic surgery consultation for skin flap  -Has 4-6 week follow up with Dr. RDelaine Lamearranged -[facility], possibly for OPAT    COVID-19 asymptomatic Presented with incidental COVID+ PCR in the context of the ongoing 2020-2021 COVID-19 pandemic.  Possible atelectasis on CXR, no hypoxia or respiratory symptoms.  In the absence of significant respiratory disease, the utility of remdesivir is unclear, and possible harm from steroid use given his osteomyelitis likely outweighs benefit.   -Keep on isolation until Mar 4  Doubted Cdiff colitis Stool testing showed toxinogenic cdiff. ID have evaluated and we both feel colitis is unlikely -- no treatment necessary.  AKI Hyponatremia Due to dehydration.  Cr 1.4 on admission, up from baseline 1.0.  Cr normalized -Monitor Cr on gentamicin  Chronic systolic CHF No evidence of fluid overload -Hold torsemide given hypotension  Chronic hypotension -Continue midodrine  Atrial flutter, typical HR controlled -Continue Xarelto, digoxin -Hold metoprolol given hypotension  Coronary disease secondary prevention -Continue aspirin and Xarelto and atorvastatin -Hold metoprolol  Diabetes Glucose controlled -Continue SS and mealtime insulin  Crohn's disease   -Continue balsalazide  Anemia of chronic disease Recent baseline appears to be 7-9 g/dL, Stable within range, no clinical bleeding other than scant oozing at wound.  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           Disposition: The patient was admitted with abnormal labs, found to have early sepsis from I suspect pelvic osteo, maybe endocarditis or lead infection.    At present we are awaiting final bone culture and work-up for endocarditis or ICD lead infection.  Once bone culture has finalized, and ID are confident in ruling in or out endocarditis/lead infection, we will arrange a plan for outpatient antibiotics.  PT recommend SNF.  Due to personal considerations, patient has firmly requested discharge home.  We are exploring SNF options, but concurrently discussing with family to provide support at home and discharge to home with wound vac, HHPT and wound VAC nurse, general surgery follow-up, ID follow-up, and pressure offloading bed.        MDM: The below labs and imaging reports reviewed and summarized above.  Medication management as above.    DVT prophylaxis: N/A on Xarelto Code Status: FULL Family Communication: Attempted to reach [name] by phone twice, no answer, left VM    Consultants:   Infectious disease  General Surgery  Cardiology   Procedures:   2/23 CT pelvis -- osteomyelitis  2/25 bone biopsy  2/27 echo -- no vegetation  TEE pending  Antimicrobials:   Vancomycin last dose 2/23 at 2200  Ceftriaxone x1 on 2/23  Azithromycin last dose on 2/23   Oral vancomycin 2/25 >> 2/26  Cefepime 2/22 >>                           Gentamicin 2/26 >>  Culture data:   2/22 blood culture -  Pseudomonas in 2/2  Urine culture No growth  2/25 bone biopsy -- proteus, reincubated for anaerobes  2/26 repeat blood cultures x2 -- NGTD          Subjective: Patient is very tired.  No nausea, vomiting, chest pain, abdominal pain, confusion, fever.  Not able to lift legs.  Able to sit on the side of the bed yesterday with PT, today too much pain to do anything.             Objective: Vitals:   09/29/19 0033 09/29/19 0251 09/29/19 0701 09/29/19 0721  BP: (!) 91/54 (!) 86/49 (!) 80/47 (!) 83/56  Pulse: 69 67 65 61  Resp: 18 20 18 18   Temp: 97.6 F (36.4 C)   98.2 F (36.8 C)  TempSrc:      SpO2: 100% 99% 100% 100%  Weight:      Height:        Intake/Output Summary (Last 24 hours) at 09/29/2019 1336 Last data filed at 09/29/2019 0300 Gross per 24 hour  Intake 575.59 ml  Output 350 ml  Net 225.59 ml   Filed Weights   09/23/19 1629  Weight: 108.9 kg    Examination: General appearance: Chronically ill-appearing elderly adult male, sleeping but easily arousable and in no acute distress.   HEENT: Anicteric, conjunctiva pink, lids and lashes normal. No nasal deformity, discharge, epistaxis.  Lips moist, mostly edentulous, oropharynx moist, no oral lesions, hearing normal   Skin: Warm and dry.  No suspicious rashes or lesions.  Pale.  Sacral ulcer not evaluated today. Cardiac: RRR, no murmurs appreciated.  No LE edema.    Respiratory: Normal respiratory rate and rhythm.  CTAB without rales or wheezes. Abdomen: Abdomen soft.  No grimace to palpation, no guarding, no rigidity or rebound. No ascites, distension, hepatosplenomegaly.   MSK: No deformities or effusions of the large joints of the upper or lower extremities bilaterally.  Severe diffuse loss of subcutaneous muscle mass and fat, severe atrophy of the legs bilaterally, and including near complete effacement of the hamstrings and calves Neuro: Sleeping but easily arousable and alert to me.. Naming is grossly intact, and the patient's recall, recent and remote, as well as general fund of knowledge seem within normal limits.  Muscle tone massively diminished, without fasciculations.  Moves upper extremities with generalized weakness, weak coordination.  Lower extremity strength is extremely weak.  He has inability to lift the legs off the bed against gravity.  Inability to flex legs at the  knee more than a few inches.  Speech fluent.  Psych: Sensorium intact and responding to questions, attention normal. Affect blunted.  Judgment and insight appear normal.         Data Reviewed: I have personally reviewed following labs and imaging studies:  CBC: Recent Labs  Lab 09/23/19 1723 09/23/19 1723 09/24/19 0138 09/24/19 0138 09/25/19 0446 09/26/19 0401 09/27/19 0035 09/28/19 0525 09/29/19 0452  WBC 29.7*   < > 24.6*   < > 17.9* 20.1* 24.2* 22.8* 19.1*  NEUTROABS 25.7*  --  21.2*  --   --   --   --   --   --   HGB 10.9*   < > 9.6*   < > 8.8* 9.6* 8.0* 8.1* 8.0*  HCT 35.2*   < > 31.1*   < > 28.1* 31.7* 26.1* 26.1* 26.6*  MCV 81.3   < > 81.4   < > 80.5 81.7 81.3 82.1 83.1  PLT 333   < > 278   < > 216 244 194 179 160   < > = values in this interval not displayed.   Basic Metabolic Panel: Recent Labs  Lab 09/23/19 1740 09/24/19 0138 09/25/19 0446 09/26/19 0401 09/27/19 0035 09/28/19 0525 09/29/19 0452  NA 134*   < > 136 139 135 137 138  K 3.5   < > 3.5 3.4* 3.7 3.9 3.3*  CL 85*   < > 96* 98 99 103 103  CO2 32   < > 29 30 28 26 24   GLUCOSE 172*   < > 266* 131* 151* 144* 107*  BUN 50*   < > 41* 39* 38* 38* 29*  CREATININE 1.43*   < > 1.06 0.88 0.88 0.85 0.79  0.74  CALCIUM 9.0   < > 8.4* 9.0 8.6* 8.6* 8.6*  MG 1.8  --  2.1 2.0 1.8  --   --   PHOS 2.9  --   --   --   --   --   --    < > = values in this interval not displayed.   GFR: Estimated Creatinine Clearance: 110.9 mL/min (by C-G formula based on SCr of 0.74 mg/dL). Liver Function Tests: Recent Labs  Lab 09/23/19 1740 09/27/19 0035  AST 24 18  ALT 10 11  ALKPHOS 137* 106  BILITOT 0.8 0.6  PROT 7.3 5.6*  ALBUMIN 2.5* 1.8*   No results for input(s): LIPASE, AMYLASE in the last 168 hours. No results for input(s): AMMONIA in the last 168 hours. Coagulation Profile: Recent Labs  Lab 09/24/19 0138  INR 2.5*   Cardiac Enzymes: No results for input(s): CKTOTAL, CKMB, CKMBINDEX, TROPONINI in  the last 168 hours. BNP (last 3 results)  No results for input(s): PROBNP in the last 8760 hours. HbA1C: No results for input(s): HGBA1C in the last 72 hours. CBG: Recent Labs  Lab 09/28/19 1122 09/28/19 1611 09/28/19 2103 09/29/19 0718 09/29/19 1116  GLUCAP 156* 125* 133* 90 138*   Lipid Profile: No results for input(s): CHOL, HDL, LDLCALC, TRIG, CHOLHDL, LDLDIRECT in the last 72 hours. Thyroid Function Tests: No results for input(s): TSH, T4TOTAL, FREET4, T3FREE, THYROIDAB in the last 72 hours. Anemia Panel: No results for input(s): VITAMINB12, FOLATE, FERRITIN, TIBC, IRON, RETICCTPCT in the last 72 hours. Urine analysis:    Component Value Date/Time   COLORURINE AMBER (A) 09/23/2019 1817   APPEARANCEUR TURBID (A) 09/23/2019 1817   LABSPEC 1.013 09/23/2019 1817   PHURINE 5.0 09/23/2019 1817   GLUCOSEU NEGATIVE 09/23/2019 1817   HGBUR NEGATIVE 09/23/2019 1817   BILIRUBINUR NEGATIVE 09/23/2019 1817   KETONESUR NEGATIVE 09/23/2019 1817   PROTEINUR NEGATIVE 09/23/2019 1817   UROBILINOGEN 1.0 11/27/2013 1017   NITRITE NEGATIVE 09/23/2019 1817   LEUKOCYTESUR LARGE (A) 09/23/2019 1817   Sepsis Labs: @LABRCNTIP (procalcitonin:4,lacticacidven:4)  ) Recent Results (from the past 240 hour(s))  Culture, blood (routine x 2)     Status: Abnormal   Collection Time: 09/23/19  8:33 PM   Specimen: BLOOD  Result Value Ref Range Status   Specimen Description BLOOD RIGHT HAND  Final   Special Requests   Final    BOTTLES DRAWN AEROBIC AND ANAEROBIC Blood Culture results may not be optimal due to an inadequate volume of blood received in culture bottles   Culture  Setup Time   Final    Organism ID to follow ANAEROBIC BOTTLE ONLY GRAM NEGATIVE RODS CRITICAL RESULT CALLED TO, READ BACK BY AND VERIFIED WITH: DAVID BESANTI AT 0600 ON 09/26/2019 [place]. Performed at [facility], [address] [place], [place] [address]    Culture (A)  Final    PSEUDOMONAS  AERUGINOSA SUSCEPTIBILITIES PERFORMED ON PREVIOUS CULTURE WITHIN THE LAST 5 DAYS.    Report Status 09/28/2019 FINAL  Final  Culture, blood (routine x 2)     Status: Abnormal   Collection Time: 09/23/19  8:33 PM   Specimen: BLOOD  Result Value Ref Range Status   Specimen Description   Final    BLOOD RIGHT FOREARM Performed at [facility], [address], [place], [place] [address]    Special Requests   Final    BOTTLES DRAWN AEROBIC AND ANAEROBIC Blood Culture results may not be optimal due to an inadequate volume of blood received in culture bottles Performed at [facility], [address] [place],  [address]    Culture  Setup Time   Final    GRAM NEGATIVE RODS ANAEROBIC BOTTLE ONLY CRITICAL VALUE NOTED.  VALUE IS CONSISTENT WITH PREVIOUSLY REPORTED AND CALLED VALUE. Performed at [facility], [place] [address], [place], [place] [address]    Culture PSEUDOMONAS AERUGINOSA (A)  Final   Report Status 09/27/2019 FINAL  Final   Organism ID, Bacteria PSEUDOMONAS AERUGINOSA  Final      Susceptibility   Pseudomonas aeruginosa - MIC*    CEFTAZIDIME 4 SENSITIVE Sensitive     CIPROFLOXACIN <=0.25 SENSITIVE Sensitive     GENTAMICIN <=1 SENSITIVE Sensitive     IMIPENEM 1 SENSITIVE Sensitive     PIP/TAZO 8 SENSITIVE Sensitive     CEFEPIME 2 SENSITIVE Sensitive     * PSEUDOMONAS AERUGINOSA  Blood Culture ID Panel (Reflexed)     Status: Abnormal  Collection Time: 09/23/19  8:33 PM  Result Value Ref Range Status   Enterococcus species NOT DETECTED NOT DETECTED Final   Listeria monocytogenes NOT DETECTED NOT DETECTED Final   Staphylococcus species NOT DETECTED NOT DETECTED Final   Staphylococcus aureus (BCID) NOT DETECTED NOT DETECTED Final   Streptococcus species NOT DETECTED NOT DETECTED Final   Streptococcus agalactiae NOT DETECTED NOT DETECTED Final   Streptococcus pneumoniae NOT DETECTED NOT DETECTED Final   Streptococcus pyogenes NOT DETECTED NOT  DETECTED Final   Acinetobacter baumannii NOT DETECTED NOT DETECTED Final   Enterobacteriaceae species NOT DETECTED NOT DETECTED Final   Enterobacter cloacae complex NOT DETECTED NOT DETECTED Final   Escherichia coli NOT DETECTED NOT DETECTED Final   Klebsiella oxytoca NOT DETECTED NOT DETECTED Final   Klebsiella pneumoniae NOT DETECTED NOT DETECTED Final   Proteus species NOT DETECTED NOT DETECTED Final   Serratia marcescens NOT DETECTED NOT DETECTED Final   Carbapenem resistance NOT DETECTED NOT DETECTED Final   Haemophilus influenzae NOT DETECTED NOT DETECTED Final   Neisseria meningitidis NOT DETECTED NOT DETECTED Final   Pseudomonas aeruginosa DETECTED (A) NOT DETECTED Final    Comment: CRITICAL RESULT CALLED TO, READ BACK BY AND VERIFIED WITH: DAVID BESANTI AT 0600 ON 09/26/2019 [place].    Candida albicans NOT DETECTED NOT DETECTED Final   Candida glabrata NOT DETECTED NOT DETECTED Final   Candida krusei NOT DETECTED NOT DETECTED Final   Candida parapsilosis NOT DETECTED NOT DETECTED Final   Candida tropicalis NOT DETECTED NOT DETECTED Final    Comment: Performed at [facility], [place], [place] [address]  SARS CORONAVIRUS 2 (TAT 6-24 HRS) Nasopharyngeal Nasopharyngeal Swab     Status: Abnormal   Collection Time: 09/23/19  9:06 PM   Specimen: Nasopharyngeal Swab  Result Value Ref Range Status   SARS Coronavirus 2 POSITIVE (A) NEGATIVE Final    Comment: RESULT CALLED TO, READ BACK BY AND VERIFIED WITH: Chauncy Passy, RN [facility]) AT [phone] ON 09/24/19 BY C. JESSUP, MT. (NOTE) SARS-CoV-2 target nucleic acids are DETECTED. The SARS-CoV-2 RNA is generally detectable in upper and lower respiratory specimens during the acute phase of infection. Positive results are indicative of the presence of SARS-CoV-2 RNA. Clinical correlation with patient history and other diagnostic information is  necessary to determine patient infection status. Positive results do not rule  out bacterial infection or co-infection with other viruses.  The expected result is Negative. Fact Sheet for Patients: SugarRoll.be Fact Sheet for Healthcare Providers: https://www.woods-mathews.com/ This test is not yet approved or cleared by the [place] FDA and  has been authorized for detection and/or diagnosis of SARS-CoV-2 by FDA under an Emergency Use Authorization (EUA). This EUA will remain  in effect (meaning this  test can be used) for the duration of the COVID-19 declaration under Section 564(b)(1) of the Act, 21 U.S.C. section 360bbb-3(b)(1), unless the authorization is terminated or revoked sooner. Performed at [facility], [place] [address], [place], [place] [address]   Urine Culture     Status: None   Collection Time: 09/25/19  7:45 AM   Specimen: Urine, Random  Result Value Ref Range Status   Specimen Description   Final    URINE, RANDOM Performed at [facility], [address], [place], [place] [address]    Special Requests   Final    NONE Performed at [facility], [address], [place], [place] [address]    Culture   Final  NO GROWTH Performed at [facility], [place] [address], [place], [place] [address]    Report Status 09/26/2019 FINAL  Final  MRSA PCR Screening     Status: None   Collection Time: 09/25/19  8:36 AM   Specimen: Nasopharyngeal  Result Value Ref Range Status   MRSA by PCR NEGATIVE NEGATIVE Final    Comment:        The GeneXpert MRSA Assay (FDA approved for NASAL specimens only), is one component of a comprehensive MRSA colonization surveillance program. It is not intended to diagnose MRSA infection nor to guide or monitor treatment for MRSA infections. Performed at [facility], [address] [place], [name] [address]   C difficile quick scan w PCR reflex     Status: Abnormal   Collection Time: 09/25/19  3:56 PM   Specimen: STOOL  Result  Value Ref Range Status   C Diff antigen POSITIVE (A) NEGATIVE Final   C Diff toxin NEGATIVE NEGATIVE Final   C Diff interpretation Results are indeterminate. See PCR results.  Final    Comment: Performed at [facility], [address] [place], [place] [address]  C. Diff by PCR, Reflexed     Status: Abnormal   Collection Time: 09/25/19  3:56 PM  Result Value Ref Range Status   Toxigenic C. Difficile by PCR POSITIVE (A) NEGATIVE Final    Comment: Positive for toxigenic C. difficile with little to no toxin production. Only treat if clinical presentation suggests symptomatic illness. Performed at [facility], [address], [place], [place] [address]   Aerobic/Anaerobic Culture (surgical/deep wound)     Status: None (Preliminary result)   Collection Time: 09/26/19 12:07 PM   Specimen: Bone; Wound  Result Value Ref Range Status   Specimen Description   Final    BONE Performed at [facility], [address] [place], [place] [address]    Special Requests   Final    NONE Performed at [facility], [address] [place], [place] [address]    Gram Stain   Final    FEW WBC PRESENT, PREDOMINANTLY PMN FEW GRAM POSITIVE COCCI FEW GRAM NEGATIVE RODS RARE GRAM POSITIVE RODS    Culture   Final    MODERATE PROTEUS MIRABILIS RARE PSEUDOMONAS AERUGINOSA RARE ENTEROCOCCUS FAECIUM SUSCEPTIBILITIES TO FOLLOW HOLDING FOR POSSIBLE ANAEROBE Performed at [facility], [address], [place], [place] [address]    Report Status PENDING  Incomplete   Organism ID, Bacteria PROTEUS MIRABILIS  Final      Susceptibility   Proteus mirabilis - MIC*    AMPICILLIN <=2 SENSITIVE Sensitive     CEFAZOLIN <=4 SENSITIVE Sensitive     CEFEPIME <=0.12 SENSITIVE Sensitive     CEFTAZIDIME <=1 SENSITIVE Sensitive     CEFTRIAXONE <=0.25 SENSITIVE Sensitive     CIPROFLOXACIN <=0.25 SENSITIVE Sensitive     GENTAMICIN <=1 SENSITIVE Sensitive     IMIPENEM 2  SENSITIVE Sensitive     TRIMETH/SULFA <=20 SENSITIVE Sensitive     AMPICILLIN/SULBACTAM <=2 SENSITIVE Sensitive     PIP/TAZO <=4 SENSITIVE Sensitive     * MODERATE PROTEUS MIRABILIS  Culture, blood (Routine X 2) w Reflex to ID Panel     Status: None (Preliminary result)   Collection Time: 09/27/19 12:34 AM   Specimen: Right Antecubital; Blood  Result Value Ref Range Status   Specimen Description RIGHT ANTECUBITAL  Final   Special Requests Blood Culture adequate volume  Final  Culture   Final    NO GROWTH 2 DAYS Performed at [facility], [address] [place], [place] [address]    Report Status PENDING  Incomplete  Culture, blood (Routine X 2) w Reflex to ID Panel     Status: None (Preliminary result)   Collection Time: 09/27/19 12:40 AM   Specimen: BLOOD RIGHT HAND  Result Value Ref Range Status   Specimen Description BLOOD RIGHT HAND  Final   Special Requests Blood Culture adequate volume  Final   Culture   Final    NO GROWTH 2 DAYS Performed at [facility], [address], [place], [place] [address]    Report Status PENDING  Incomplete         Radiology Studies: ECHOCARDIOGRAM COMPLETE  Result Date: 09/28/2019    ECHOCARDIOGRAM REPORT   Patient Name:   Colton [place] Date of Exam: 09/28/2019 Medical Rec #:  [id number]        Height:       71.0 in Accession #:    [id number]       Weight:       240.0 lb Date of Birth:  1951-03-15        BSA:          2.278 m Patient Age:    52 years         BP:           107/50 mmHg Patient Gender: M                HR:           83 bpm. Exam Location:  [facility] Procedure: 2D Echo Indications:     BACTEREMIA 790.7/R78.81  History:         Patient has prior history of Echocardiogram examinations, most                  recent 06/25/2019. CAD, Signs/Symptoms:Syncope; Risk                  Factors:Dyslipidemia, Diabetes and Hypertension.  Sonographer:     Avanell Shackleton Referring Phys:  [id number] Tsosie Billing Diagnosing  Phys: Ida Rogue MD  Sonographer Comments: Technically difficult study due to poor echo windows, suboptimal parasternal window, suboptimal apical window and Technically challenging study due to limited acoustic windows. THERE IS ALOT OF ARTIFACT PRESENT DUE TO THE INTERFERENCE FROM THE EQUIPMENT IN THE COVID UNIT IMPRESSIONS  1. Left ventricular ejection fraction, by estimation, is 25 to 30%. The left ventricle has severely decreased function. The left ventricle demonstrates regional wall motion abnormalities , anterior , anterospetal and apical wall hypokinesis.  2. Right ventricular systolic function is normal. The right ventricular size is normal. There is mildly elevated pulmonary artery systolic pressure.  3. Left atrial size was mildly dilated.  4. No vegetation noted. FINDINGS  Left Ventricle: Left ventricular ejection fraction, by estimation, is 25 to 30%. The left ventricle has severely decreased function. The left ventricle demonstrates regional wall motion abnormalities. The left ventricular internal cavity size was normal  in size. There is no left ventricular hypertrophy. Left ventricular diastolic parameters are indeterminate. Right Ventricle: The right ventricular size is normal. No increase in right ventricular wall thickness. Right ventricular systolic function is normal. There is mildly elevated pulmonary artery systolic pressure. The tricuspid regurgitant velocity is 2.66  m/s, and with an assumed right atrial pressure of 10 mmHg, the estimated right ventricular systolic pressure is 48.0 mmHg. Left Atrium: Left  atrial size was mildly dilated. Right Atrium: Right atrial size was normal in size. Pericardium: Trivial pericardial effusion is present. Mitral Valve: The mitral valve is normal in structure and function. Normal mobility of the mitral valve leaflets. No evidence of mitral valve regurgitation. No evidence of mitral valve stenosis. Tricuspid Valve: The tricuspid valve is normal in  structure. Tricuspid valve regurgitation is not demonstrated. No evidence of tricuspid stenosis. Aortic Valve: The aortic valve is normal in structure and function. Aortic valve regurgitation is not visualized. No aortic stenosis is present. Pulmonic Valve: The pulmonic valve was normal in structure. Pulmonic valve regurgitation is not visualized. No evidence of pulmonic stenosis. Aorta: The aortic root is normal in size and structure. Venous: The inferior vena cava is normal in size with greater than 50% respiratory variability, suggesting right atrial pressure of 3 mmHg. IAS/Shunts: No atrial level shunt detected by color flow Doppler.  LEFT VENTRICLE PLAX 2D LVIDd:         5.07 cm LVIDs:         4.27 cm LV PW:         0.81 cm LV IVS:        1.04 cm  LEFT ATRIUM         Index LA diam:    4.00 cm 1.76 cm/m   AORTA Ao Root diam: 4.70 cm TRICUSPID VALVE TR Peak grad:   28.3 mmHg TR Vmax:        266.00 cm/s Ida Rogue MD Electronically signed by Ida Rogue MD Signature Date/Time: 09/28/2019/2:38:15 PM    Final         Scheduled Meds: . ascorbic acid  500 mg Oral BID  . aspirin EC  81 mg Oral Daily  . atorvastatin  80 mg Oral Daily  . balsalazide  750 mg Oral TID  . Chlorhexidine Gluconate Cloth  6 each Topical Q0600  . collagenase  1 application Topical Daily  . digoxin  0.125 mg Oral Daily  . finasteride  5 mg Oral Daily  . insulin aspart  0-5 Units Subcutaneous QHS  . insulin aspart  0-9 Units Subcutaneous TID WC  . insulin aspart  5 Units Subcutaneous TID AC  . lactobacillus  1 g Oral TID WC  . metroNIDAZOLE  500 mg Oral Q8H  . midodrine  10 mg Oral TID  . multivitamin-lutein  1 capsule Oral Daily  . niacin  1,000 mg Oral QHS  . pantoprazole  40 mg Oral Daily  . rivaroxaban  20 mg Oral Q lunch  . sertraline  25 mg Oral Daily  . sodium chloride flush  3 mL Intravenous Q12H  . tamsulosin  0.4 mg Oral QPC breakfast   Continuous Infusions: . sodium chloride 10 mL/hr at 09/29/19  0300  . ceFEPime (MAXIPIME) IV 2 g (09/29/19 0912)  . gentamicin       LOS: 6 days    Time spent: 25 minutes    Edwin Dada, MD [facility] 09/29/2019, 1:36 PM     Please page though [place] or Epic secure chat:  For [employer], Adult nurse

## 2019-09-29 NOTE — Progress Notes (Addendum)
Physical Therapy Treatment Patient Details Name: Colton Lamb MRN: [id number] DOB: Nov 04, 1950 Today's Date: 09/29/2019    History of Present Illness Per MD note: 69 y.o. 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  Also found to be COVID positive. Being treated for pseudomonas bacteremia and possible acute on chronic osteomyelitis. Patient has multiple pressure ulcers present on arrival. Pt well known to therapy and reports he hasn't ambulated in almost 1 year. Has been total care at [facility].    PT Comments    Pt with low BP this date, however agreeable to attempt therapy. Able to participate in B LE/UE there-ex. Initially agreeable that in order to progress independence, OOB mobility needs to be performed. Pt then becomes painful with rolling and further refuses any additional therapy. He states he knows he needs to get up, but it's too painful and he doesn't want to try. Discussed therapy progression and education for continuing with mobility efforts. Pt agreeable to continue therapy another day.   May need to coordinate pain med schedule with therapy for optimum participation.  Follow Up Recommendations  SNF(pt prefers/hopeful for home dc)     Equipment Recommendations  Hospital bed(lift equipment)    Recommendations for Other Services       Precautions / Restrictions Precautions Precautions: Fall Restrictions Weight Bearing Restrictions: No    Mobility  Bed Mobility Overal bed mobility: Needs Assistance Bed Mobility: Rolling Rolling: Mod assist         General bed mobility comments: only agreeable to 1 attempt for rolling towards R side. Able to reach for bed rail, however unable to fully attain sidelying position. When therapist assisted for B LE, pt cried out in pain  and refused to further attempt..  Transfers                 General transfer comment: unable  Ambulation/Gait                 Stairs             Wheelchair Mobility    Modified Rankin (Stroke Patients Only)       Balance                                            Cognition Arousal/Alertness: Awake/alert Behavior During Therapy: WFL for tasks assessed/performed Overall Cognitive Status: Within Functional Limits for tasks assessed                                 General Comments: very pleasant and agreeable to work with therapy      Exercises Other Exercises Other Exercises: supine ther-ex performed on B LE including SLR, resisted heel slides, flutter valve, quad sets, and resisted elbow flexion/extension. All ther-ex performed x 10 reps with cga. Pt follow commands well for participation.    General Comments        Pertinent Vitals/Pain Pain Assessment: 0-10 Pain Score: 5  Pain Location: B LE  Pain Descriptors / Indicators: Discomfort Pain Intervention(s): Limited activity within patient's tolerance;Repositioned    Home Living Family/patient expects to be discharged to:: Private residence Living Arrangements: Alone  Prior Function            PT Goals (current goals can now be found in the care plan section) Acute Rehab PT Goals Patient Stated Goal: to go home PT Goal Formulation: With patient Time For Goal Achievement: 10/12/19 Potential to Achieve Goals: Fair Progress towards PT goals: Progressing toward goals    Frequency    Min 2X/week      PT Plan Current plan remains appropriate    Co-evaluation              AM-PAC PT "6 Clicks" Mobility   Outcome Measure  Help needed turning from your back to your side while in a flat bed without using bedrails?: A Lot Help needed moving from lying on your back to sitting on the side of a flat bed without using bedrails?:  Total Help needed moving to and from a bed to a chair (including a wheelchair)?: Total Help needed standing up from a chair using your arms (e.g., wheelchair or bedside chair)?: Total Help needed to walk in hospital room?: Total Help needed climbing 3-5 steps with a railing? : Total 6 Click Score: 7    End of Session   Activity Tolerance: Patient limited by pain Patient left: in bed Nurse Communication: Mobility status PT Visit Diagnosis: Muscle weakness (generalized) (M62.81);Difficulty in walking, not elsewhere classified (R26.2)     Time: 1443-1540 PT Time Calculation (min) (ACUTE ONLY): 16 min  Charges:  $Therapeutic Exercise: 8-22 mins                     [name], PT, DPT [phone]    [name] 09/29/2019, 12:07 PM

## 2019-09-29 NOTE — Progress Notes (Signed)
NP made aware of BP. No added interventions at this time. Will continue to monitor.

## 2019-09-30 ENCOUNTER — Inpatient Hospital Stay: Payer: Self-pay

## 2019-09-30 DIAGNOSIS — J80 Acute respiratory distress syndrome: Secondary | ICD-10-CM

## 2019-09-30 LAB — GLUCOSE, CAPILLARY
Glucose-Capillary: 128 mg/dL — ABNORMAL HIGH (ref 70–99)
Glucose-Capillary: 149 mg/dL — ABNORMAL HIGH (ref 70–99)
Glucose-Capillary: 177 mg/dL — ABNORMAL HIGH (ref 70–99)
Glucose-Capillary: 158 mg/dL — ABNORMAL HIGH (ref 70–99)

## 2019-09-30 LAB — CBC
HCT: 26.8 % — ABNORMAL LOW (ref 39.0–52.0)
Hemoglobin: 8.2 g/dL — ABNORMAL LOW (ref 13.0–17.0)
MCH: 25.3 pg — ABNORMAL LOW (ref 26.0–34.0)
MCHC: 30.6 g/dL (ref 30.0–36.0)
MCV: 82.7 fL (ref 80.0–100.0)
Platelets: 145 10*3/uL — ABNORMAL LOW (ref 150–400)
RBC: 3.24 MIL/uL — ABNORMAL LOW (ref 4.22–5.81)
RDW: 20.5 % — ABNORMAL HIGH (ref 11.5–15.5)
WBC: 20.2 10*3/uL — ABNORMAL HIGH (ref 4.0–10.5)
nRBC: 0 % (ref 0.0–0.2)

## 2019-09-30 LAB — GENTAMICIN LEVEL, RANDOM
Gentamicin Rm: 10.3 ug/mL
Gentamicin Rm: 5.5 ug/mL

## 2019-09-30 LAB — BASIC METABOLIC PANEL
Anion gap: 8 (ref 5–15)
BUN: 28 mg/dL — ABNORMAL HIGH (ref 8–23)
CO2: 25 mmol/L (ref 22–32)
Calcium: 8.7 mg/dL — ABNORMAL LOW (ref 8.9–10.3)
Chloride: 107 mmol/L (ref 98–111)
Creatinine, Ser: 0.63 mg/dL (ref 0.61–1.24)
GFR calc Af Amer: >60 mL/min (ref >60)
GFR calc non Af Amer: >60 mL/min (ref >60)
Glucose, Bld: 123 mg/dL — ABNORMAL HIGH (ref 70–99)
Potassium: 3.7 mmol/L (ref 3.5–5.1)
Sodium: 140 mmol/L (ref 135–145)

## 2019-09-30 MED ORDER — SODIUM CHLORIDE 0.9 % IV SOLN
800.0000 mg | Freq: Every day | INTRAVENOUS | Status: DC
  Administered 2019-09-30: 18:00:00 800 mg via INTRAVENOUS
  Filled 2019-09-30 (×2): qty 16

## 2019-09-30 NOTE — TOC Progression Note (Signed)
Transition of Care [facility]) - Progression Note    Patient Details  Name: Colton Lamb MRN: [id number] Date of Birth: February 01, 1951  Transition of Care [facility]) CM/SW Contact  Shelbie Hutching, RN Phone Number: 09/30/2019, 9:26 AM  Clinical Narrative:    Hospital bed, lift, 3 in 1, and walker all ordered, specialized hospital bed needs insurance authorization before it can be delivered.  Wound vac will be placed today by general surgery.  Wound vac will be provided by [employer].  Discharge plan is for home with home health possible OP palliative referral if patient agreeable.      Expected Discharge Plan: [name] Barriers to Discharge: Continued Medical Work up  Expected Discharge Plan and Services Expected Discharge Plan: [place]   Discharge Planning Services: CM Consult Post Acute Care Choice: [place] arrangements for the past 2 months: [place]: RN, PT, OT, Nurse's Aide, Social Work [facility] Agency: [facility] [place] Date [place]: 09/27/19 Time [place]: 1400 Representative spoke with at [place]: [place] ([place]) Interventions    Readmission Risk Interventions Readmission Risk Prevention Plan 07/18/2019 07/12/2019 06/14/2019  Transportation Screening Complete Complete Complete  Social Work Consult for [place] - - -  Medication Review Press photographer) Complete Complete Complete  PCP or Specialist appointment within 3-5 days of discharge (No Data) - Complete  HRI or [place] - Not Complete -  HRI or Home Care Consult Pt Refusal Comments return to SNF Return to SNF -  SW Recovery Care/Counseling Consult - Complete Complete  Palliative Care Screening - Not Applicable Not Applicable  Skilled Nursing Facility Complete Complete Complete  Some recent data  might be hidden

## 2019-09-30 NOTE — Progress Notes (Signed)
[facility] SURGICAL PROGRESS NOTE (cpt [phone])  Hospital Day(s): 7.   Interval History: Patient seen and examined, no acute events or new complaints overnight. Patient reports he is doing well. No complaints. We were asked to place wound vac to sacral wound for wound management. Still has not been mobile despite PT and RN staff effort.   Review of Systems:  Constitutional: denies fever, chills  HEENT: denies cough or congestion  Respiratory: denies any shortness of breath  Cardiovascular: denies chest pain or palpitations  Gastrointestinal: denies abdominal pain, N/V, or diarrhea/and bowel function as per interval history Musculoskeletal: + sacral Decubitus Ulcer   Vital signs in last 24 hours: [min-max] current  Temp:  [97.7 F (36.5 C)-97.9 F (36.6 C)] 97.7 F (36.5 C) (03/01 0700) Pulse Rate:  [72-82] 72 (03/01 0700) Resp:  [14] 14 (03/01 0700) BP: (87-93)/(52-60) 93/60 (03/01 0700) SpO2:  [98 %] 98 % (03/01 0700)     Height: 5' 11"  (180.3 cm) Weight: 108.9 kg BMI (Calculated): 33.49   Intake/Output last 2 shifts:  02/28 0701 - 03/01 0700 In: 1458.8 [P.O.:800; I.V.:250.8; IV Piggyback:408] Out: -    Physical Exam:  Constitutional: alert, cooperative and no distress  HENT: normocephalic without obvious abnormality  Eyes: PERRL, EOM's grossly intact and symmetric  Respiratory: breathing non-labored at rest  Cardiovascular: regular rate and sinus rhythm  Gastrointestinal: soft, non-tender, and non-distended, diverting loop colostomy, patent, stool and gas in bag Integumentary: There is a stage VI pressure injury to the sacrum, coccyx is palpable and visible, the wound is almost an "upside down heart shape," the wound bed has pink healthy tissue, I do no appreciate any purulence or necrosis. Unfortunately it appears this area is still being saturated in urine despite attempted urinary management.    Labs:  CBC Latest Ref Rng & Units 09/30/2019 09/29/2019  09/28/2019  WBC 4.0 - 10.5 K/uL 20.2(H) 19.1(H) 22.8(H)  Hemoglobin 13.0 - 17.0 g/dL 8.2(L) 8.0(L) 8.1(L)  Hematocrit 39.0 - 52.0 % 26.8(L) 26.6(L) 26.1(L)  Platelets 150 - 400 K/uL 145(L) 160 179   CMP Latest Ref Rng & Units 09/30/2019 09/29/2019 09/29/2019  Glucose 70 - 99 mg/dL 123(H) 107(H) -  BUN 8 - 23 mg/dL 28(H) 29(H) -  Creatinine 0.61 - 1.24 mg/dL 0.63 0.79 0.74  Sodium 135 - 145 mmol/L 140 138 -  Potassium 3.5 - 5.1 mmol/L 3.7 3.3(L) -  Chloride 98 - 111 mmol/L 107 103 -  CO2 22 - 32 mmol/L 25 24 -  Calcium 8.9 - 10.3 mg/dL 8.7(L) 8.6(L) -  Total Protein 6.5 - 8.1 g/dL - - -  Total Bilirubin 0.3 - 1.2 mg/dL - - -  Alkaline Phos 38 - 126 U/L - - -  AST 15 - 41 U/L - - -  ALT 0 - 44 U/L - - -    Imaging studies: No new pertinent imaging studies   Assessment/Plan: (ICD-10's: L89.154) 69 y.o. 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 I additionally cut a small 1x1 cm square. The small square was packed deep into the left side of the wound and covered with the remaining  grey foam. This was then secured with drape ensuring that to achieve a good seal in the gluteal crease. I then cut a long piece of grey foam which was bridged from the wound to the right trochanter to ensure patient was not laying on the suction tubing. This was secured and connected to wound vac with a good seal obtained.    - patient will need home wound vac changes MWF  - He can follow up with myself in 1-2 weeks for wound check however ultimately he may benefit from management at the wound care center  - Again, I do not think plastic  surgery referral is warranted at this time. He is immobile and would not be amenable to any sort of potential closure   - Continue ABx per ID   - Further management per primary service   All of the above findings and recommendations were discussed with the patient, and the medical team, and all of patient's questions were answered to his expressed satisfaction.  -- Edison Simon, PA-C [facility] 09/30/2019, 1:09 PM [phone] M-F: 7am - 4pm

## 2019-09-30 NOTE — Evaluation (Signed)
Occupational Therapy Evaluation Patient Details Name: Colton Lamb MRN: [id number] DOB: 1950-08-14 Today's Date: 09/30/2019    History of Present Illness Per MD note: 69 y.o. 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  Also found to be COVID positive. Being treated for pseudomonas bacteremia and possible acute on chronic osteomyelitis. Patient has multiple pressure ulcers present on arrival. Pt well known to therapy and reports he hasn't ambulated in almost 1 year. Has been total care at [facility].   Clinical Impression   Pt was seen for OT evaluation this date. Prior to hospital admission, pt was in SNF and requiring assist with all aspects of ADLs and ADL/bed mobility. Prior to SNF, pt was living home alone with 3 STE. Currently pt demonstrates impairments as described below (See OT problem list) which functionally limit his ability to perform ADL/self-care tasks. Pt currently requires MOD A +2 to attempt bed mobility, but is unable to tolerate coming to full sit on assessment d/t pain. Pt is agreeable, however, to trying again tomorrow. In addition, pt is requiring MAX to TOTAL A with LB ADLs at bed level 2/2 pain in coccyx/sacral area.  Pt would benefit from skilled OT to address noted impairments and functional limitations (see below for any additional details) in order to maximize safety and independence while minimizing falls risk and caregiver burden. Upon hospital discharge, recommend SNF to maximize pt safety and return to PLOF.     Follow Up Recommendations  SNF    Equipment Recommendations  Other (comment)(defer to next level of care. Pt is apparently planning to return home per chart review. In which case he'll need w/c, lift, and hospital bed.)    Recommendations for Other Services        Precautions / Restrictions Precautions Precautions: Fall Restrictions Weight Bearing Restrictions: No      Mobility Bed Mobility Overal bed mobility: Needs Assistance Bed Mobility: Rolling;Supine to Sit Rolling: Mod assist   Supine to sit: Mod assist;+2 for physical assistance     General bed mobility comments: attempted to do side lying to sit but difficulty bringing pt to fully side-lying for log roll technique. Able to initiate sup<>sit with assist, but only gets to 1/2 or 3/4 point before needing to return to supine  Transfers                 General transfer comment: unable    Balance                                           ADL either performed or assessed with clinical judgement   ADL Overall ADL's : Needs assistance/impaired Eating/Feeding: Set up;Bed level Eating/Feeding Details (indicate cue type and reason): HOB elevated Grooming: Set up;Bed level Grooming Details (indicate cue type and reason): HOB elevated Upper Body Bathing: Minimal assistance;Bed level Upper Body Bathing Details (indicate cue type and reason): HOB elevated Lower Body Bathing: Total assistance;Bed level Lower Body Bathing Details (indicate cue type and reason): using lateral rolling technique Upper Body Dressing : Minimal assistance;Bed level   Lower Body Dressing: Total assistance;Bed level Lower Body Dressing Details (indicate cue type and reason): d/t pain   Toilet Transfer Details (indicate cue type and reason): unable  to assess transfers as pt only minimally able to tolerate sup<>sit and does not come to full sit d/t pain in sacrum/coccyx area Toileting- Clothing Manipulation and Hygiene: Maximal assistance Toileting - Clothing Manipulation Details (indicate cue type and reason): MAX A for peri care at bed level using lateral rolling technique, pt able to contribute to rolling with UB with use of bed rails             Vision Patient Visual  Report: No change from baseline       Perception     Praxis      Pertinent Vitals/Pain Pain Assessment: Faces Faces Pain Scale: Hurts even more Pain Location: coccyx with attempt at mobility Pain Descriptors / Indicators: Discomfort Pain Intervention(s): Limited activity within patient's tolerance;Monitored during session     Hand Dominance Right   Extremity/Trunk Assessment Upper Extremity Assessment Upper Extremity Assessment: Generalized weakness(BUE grossly 4/5)   Lower Extremity Assessment Lower Extremity Assessment: Defer to PT evaluation;Generalized weakness       Communication Communication Communication: No difficulties   Cognition Arousal/Alertness: Awake/alert Behavior During Therapy: WFL for tasks assessed/performed Overall Cognitive Status: Within Functional Limits for tasks assessed                                 General Comments: agreeable to work with therapy, oriented, good cue following   General Comments  while pt comes to partial sit, unable to assess sitting balance. Pt agreeable to re-attempting tomorrow.    Exercises Other Exercises Other Exercises: OT engages pt in 1 set x10 reps modified crunches at bed level to improve core strength as it relates to ADL transfers/mobility and seated ADL tasks. Other Exercises: OT engages pt in 1 set x10 reps each arm forward punches to increase strength and fxl activity tolerance. Other Exercises: OT engages pt in 1 set x10 reps lateral cross-body reaching for oblique and UB strength as it pertains to reaching to obtain ADL items and dynamic sitting.   Shoulder Instructions      Home Living Family/patient expects to be discharged to:: Private residence Living Arrangements: Alone Available Help at Discharge: Family;Available PRN/intermittently Type of Home: Mobile home Home Access: Stairs to enter Entrance Stairs-Number of Steps: 3 Entrance Stairs-Rails: Right Home Layout: One level      Bathroom Shower/Tub: Walk-in shower(small step in)   Bathroom Toilet: Handicapped height     Home Equipment: Environmental consultant - 2 wheels;Hand held shower head;Wheelchair - manual   Additional Comments: Per chart review, family is working on getting 24/7 care set up      Prior Functioning/Environment Level of Independence: Needs assistance  Gait / Transfers Assistance Needed: Per chart review from pt's admit 2 MA, he told PT that he had progressed to some standing with assistance at SNF     Comments: On this admit, pt reports he hasn't ambulated or got OOB in almost 1 year. Has been reliant on staff for all needs.        OT Problem List: Decreased strength;Decreased activity tolerance;Impaired balance (sitting and/or standing);Decreased knowledge of use of DME or AE;Pain      OT Treatment/Interventions: Self-care/ADL training;Therapeutic exercise;Energy conservation;DME and/or AE instruction;Therapeutic activities;Patient/family education;Balance training    OT Goals(Current goals can be found in the care plan section) Acute Rehab OT Goals Patient Stated Goal: to go home OT Goal Formulation: With patient Time For Goal Achievement: 10/14/19 Potential to Achieve Goals: [place]  OT  Frequency: Min 2X/week   Barriers to D/C:            Co-evaluation PT/OT/SLP Co-Evaluation/Treatment: Yes Reason for Co-Treatment: Complexity of the patient's impairments (multi-system involvement);For patient/therapist safety PT goals addressed during session: Mobility/safety with mobility;Strengthening/ROM OT goals addressed during session: Other (comment);ADL's and self-care(evaluation)      AM-PAC OT "6 Clicks" Daily Activity     Outcome Measure Help from another person eating meals?: None Help from another person taking care of personal grooming?: A Little Help from another person toileting, which includes using toliet, bedpan, or urinal?: A Lot Help from another person bathing (including washing,  rinsing, drying)?: A Lot Help from another person to put on and taking off regular upper body clothing?: A Little Help from another person to put on and taking off regular lower body clothing?: Total 6 Click Score: 15   End of Session    Activity Tolerance: Patient limited by pain Patient left: in bed;with call bell/phone within reach;with bed alarm set  OT Visit Diagnosis: Muscle weakness (generalized) (M62.81);Pain Pain - Right/Left: (NA) Pain - part of body: (coccyx/sacral area)                Time: 1336-1350 OT Time Calculation (min): 14 min Charges:  OT General Charges $OT Visit: 1 Visit OT Evaluation $OT Eval Moderate Complexity: [place], [place], OTR/L ascom [phone] 09/30/19, 4:19 PM

## 2019-09-30 NOTE — Progress Notes (Signed)
[facility] INFECTIOUS DISEASE PROGRESS NOTE Date of Admission:  09/23/2019     ID: Colton Lamb is a 69 y.o. male with Pseudomonal bacteremia and chronic sacral ulcer Principal Problem:   Sepsis ([place]) Active Problems:   Hyperlipidemia   CAD S/P percutaneous coronary angioplasty - multiple PCIs   Atrial fibrillation (HCC)   GERD   CROHN'S DISEASE-LARGE & SMALL INTESTINE   Long term current use of anticoagulant   Cardiomyopathy, ischemic   OSA (obstructive sleep apnea)   ICD (implantable cardioverter-defibrillator) in place   Diabetes mellitus type 2, uncontrolled, with complications ([place])   Type II diabetes mellitus ([place])   Hypertension, essential   Pressure injury of skin   Decubitus ulcer of sacral region, unstageable ([place])   Chronic osteomyelitis ([place])   HCAP (healthcare-associated pneumonia)   Colostomy in place ([place])   Goals of care, counseling/discussion   Palliative care by specialist   Acute respiratory distress syndrome (ARDS) due to COVID-19 virus ([place])   Bacteremia due to Pseudomonas   Subjective: No fevers, seen by surg for wound vac change. He reports feeling fine.  ROS  Eleven systems are reviewed and negative except per hpi  Medications:  Antibiotics Given (last 72 hours)    Date/Time Action Medication Dose Rate   09/27/19 1659 New Bag/Given   ceFEPIme (MAXIPIME) 2 g in sodium chloride 0.9 % 100 mL IVPB 2 g 200 mL/hr   09/27/19 2253 Given   metroNIDAZOLE (FLAGYL) tablet 500 mg 500 mg    09/27/19 2254 New Bag/Given  [new IV access]   gentamicin (GARAMYCIN) 220 mg in dextrose 5 % 50 mL IVPB 220 mg 111 mL/hr   09/28/19 0033 New Bag/Given   ceFEPIme (MAXIPIME) 2 g in sodium chloride 0.9 % 100 mL IVPB 2 g 200 mL/hr   09/28/19 0538 Given   metroNIDAZOLE (FLAGYL) tablet 500 mg 500 mg    09/28/19 9563 New Bag/Given   gentamicin (GARAMYCIN) 200 mg in dextrose 5 % 50 mL IVPB 200 mg 110 mL/hr   09/28/19 0957 New Bag/Given   ceFEPIme (MAXIPIME) 2 g in  sodium chloride 0.9 % 100 mL IVPB 2 g 200 mL/hr   09/28/19 1335 Given   metroNIDAZOLE (FLAGYL) tablet 500 mg 500 mg    09/28/19 1341 New Bag/Given   gentamicin (GARAMYCIN) 200 mg in dextrose 5 % 50 mL IVPB 200 mg 110 mL/hr   09/28/19 1549 New Bag/Given   ceFEPIme (MAXIPIME) 2 g in sodium chloride 0.9 % 100 mL IVPB 2 g 200 mL/hr   09/28/19 2126 Given   metroNIDAZOLE (FLAGYL) tablet 500 mg 500 mg    09/28/19 2345 New Bag/Given   ceFEPIme (MAXIPIME) 2 g in sodium chloride 0.9 % 100 mL IVPB 2 g 200 mL/hr   09/29/19 8756 Given   metroNIDAZOLE (FLAGYL) tablet 500 mg 500 mg    09/29/19 0912 New Bag/Given   ceFEPIme (MAXIPIME) 2 g in sodium chloride 0.9 % 100 mL IVPB 2 g 200 mL/hr   09/29/19 1656 Given   metroNIDAZOLE (FLAGYL) tablet 500 mg 500 mg    09/29/19 1703 New Bag/Given   ceFEPIme (MAXIPIME) 2 g in sodium chloride 0.9 % 100 mL IVPB 2 g 200 mL/hr   09/29/19 2217 New Bag/Given   gentamicin (GARAMYCIN) 300 mg in dextrose 5 % 100 mL IVPB 300 mg 107.5 mL/hr   09/29/19 2219 Given   metroNIDAZOLE (FLAGYL) tablet 500 mg 500 mg    09/30/19 0138 New Bag/Given   ceFEPIme (MAXIPIME) 2 g  in sodium chloride 0.9 % 100 mL IVPB 2 g 200 mL/hr   09/30/19 0532 Given   metroNIDAZOLE (FLAGYL) tablet 500 mg 500 mg    09/30/19 0804 New Bag/Given   ceFEPIme (MAXIPIME) 2 g in sodium chloride 0.9 % 100 mL IVPB 2 g 200 mL/hr   09/30/19 1313 Given   metroNIDAZOLE (FLAGYL) tablet 500 mg 500 mg      . ascorbic acid  500 mg Oral BID  . aspirin EC  81 mg Oral Daily  . atorvastatin  80 mg Oral Daily  . balsalazide  750 mg Oral TID  . Chlorhexidine Gluconate Cloth  6 each Topical Q0600  . digoxin  0.125 mg Oral Daily  . finasteride  5 mg Oral Daily  . insulin aspart  0-5 Units Subcutaneous QHS  . insulin aspart  0-9 Units Subcutaneous TID WC  . insulin aspart  5 Units Subcutaneous TID AC  . lactobacillus  1 g Oral TID WC  . metroNIDAZOLE  500 mg Oral Q8H  . midodrine  10 mg Oral TID  . multivitamin-lutein   1 capsule Oral Daily  . niacin  1,000 mg Oral QHS  . pantoprazole  40 mg Oral Daily  . rivaroxaban  20 mg Oral Q lunch  . sertraline  25 mg Oral Daily  . sodium chloride flush  3 mL Intravenous Q12H  . tamsulosin  0.4 mg Oral QPC breakfast    Objective: Vital signs in last 24 hours: Temp:  [97.7 F (36.5 C)-97.9 F (36.6 C)] 97.7 F (36.5 C) (03/01 0700) Pulse Rate:  [72-82] 72 (03/01 0700) Resp:  [14] 14 (03/01 0700) BP: (87-93)/(52-60) 93/60 (03/01 0700) SpO2:  [98 %] 98 % (03/01 0700) Physical Exam  Constitutional: chronically ill appearing HENT: anicteric Mouth/Throat: Oropharynx is clear and moist. No oropharyngeal exudate.  Cardiovascular: Normal rate, regular rhythm and normal heart sounds. Pulmonary/Chest: Effort normal and breath sounds normal. No respiratory distress. He has no wheezes.  Abdominal: Soft. Bowel sounds are normal. He exhibits no distension. There is no tenderness.  Lymphadenopathy:  He has no cervical adenopathy.  Neurological: He is alert and oriented to person, place, and time.  Integumentary: covered with wound vac- per surg note today "There is a stage VI pressure injury to the sacrum, coccyx is palpable and visible, the wound is almost an "upside down heart shape," the wound bed has pink healthy tissue, I do no appreciate any purulence or necrosis. Unfortunately it appears this area is still being saturated in urine despite attempted urinary management. " Psychiatric: He has a normal mood and affect. His behavior is normal.    Lab Results Recent Labs    09/29/19 0452 09/30/19 0311  WBC 19.1* 20.2*  HGB 8.0* 8.2*  HCT 26.6* 26.8*  NA 138 140  K 3.3* 3.7  CL 103 107  CO2 24 25  BUN 29* 28*  CREATININE 0.79  0.74 0.63    Microbiology: Results for orders placed or performed during the hospital encounter of 09/23/19  Culture, blood (routine x 2)     Status: Abnormal   Collection Time: 09/23/19  8:33 PM   Specimen: BLOOD  Result Value Ref  Range Status   Specimen Description BLOOD RIGHT HAND  Final   Special Requests   Final    BOTTLES DRAWN AEROBIC AND ANAEROBIC Blood Culture results may not be optimal due to an inadequate volume of blood received in culture bottles   Culture  Setup Time   Final  Organism ID to follow ANAEROBIC BOTTLE ONLY GRAM NEGATIVE RODS CRITICAL RESULT CALLED TO, READ BACK BY AND VERIFIED WITH: Aleeyah Bensen BESANTI AT 0600 ON 09/26/2019 [place]. Performed at [facility], [address] [place], [place] [address]    Culture (A)  Final    PSEUDOMONAS AERUGINOSA SUSCEPTIBILITIES PERFORMED ON PREVIOUS CULTURE WITHIN THE LAST 5 DAYS.    Report Status 09/28/2019 FINAL  Final  Culture, blood (routine x 2)     Status: Abnormal   Collection Time: 09/23/19  8:33 PM   Specimen: BLOOD  Result Value Ref Range Status   Specimen Description   Final    BLOOD RIGHT FOREARM Performed at [facility], [address], [place], [place] [address]    Special Requests   Final    BOTTLES DRAWN AEROBIC AND ANAEROBIC Blood Culture results may not be optimal due to an inadequate volume of blood received in culture bottles Performed at [facility], [address] [place], [place] [address]    Culture  Setup Time   Final    GRAM NEGATIVE RODS ANAEROBIC BOTTLE ONLY CRITICAL VALUE NOTED.  VALUE IS CONSISTENT WITH PREVIOUSLY REPORTED AND CALLED VALUE. Performed at [facility], [place] [address] [place], [place] [address]    Culture PSEUDOMONAS AERUGINOSA (A)  Final   Report Status 09/27/2019 FINAL  Final   Organism ID, Bacteria PSEUDOMONAS AERUGINOSA  Final      Susceptibility   Pseudomonas aeruginosa - MIC*    CEFTAZIDIME 4 SENSITIVE Sensitive     CIPROFLOXACIN <=0.25 SENSITIVE Sensitive     GENTAMICIN <=1 SENSITIVE Sensitive     IMIPENEM 1 SENSITIVE Sensitive     PIP/TAZO 8 SENSITIVE Sensitive     CEFEPIME 2 SENSITIVE Sensitive     * PSEUDOMONAS AERUGINOSA  Blood Culture ID Panel  (Reflexed)     Status: Abnormal   Collection Time: 09/23/19  8:33 PM  Result Value Ref Range Status   Enterococcus species NOT DETECTED NOT DETECTED Final   Listeria monocytogenes NOT DETECTED NOT DETECTED Final   Staphylococcus species NOT DETECTED NOT DETECTED Final   Staphylococcus aureus (BCID) NOT DETECTED NOT DETECTED Final   Streptococcus species NOT DETECTED NOT DETECTED Final   Streptococcus agalactiae NOT DETECTED NOT DETECTED Final   Streptococcus pneumoniae NOT DETECTED NOT DETECTED Final   Streptococcus pyogenes NOT DETECTED NOT DETECTED Final   Acinetobacter baumannii NOT DETECTED NOT DETECTED Final   Enterobacteriaceae species NOT DETECTED NOT DETECTED Final   Enterobacter cloacae complex NOT DETECTED NOT DETECTED Final   Escherichia coli NOT DETECTED NOT DETECTED Final   Klebsiella oxytoca NOT DETECTED NOT DETECTED Final   Klebsiella pneumoniae NOT DETECTED NOT DETECTED Final   Proteus species NOT DETECTED NOT DETECTED Final   Serratia marcescens NOT DETECTED NOT DETECTED Final   Carbapenem resistance NOT DETECTED NOT DETECTED Final   Haemophilus influenzae NOT DETECTED NOT DETECTED Final   Neisseria meningitidis NOT DETECTED NOT DETECTED Final   Pseudomonas aeruginosa DETECTED (A) NOT DETECTED Final    Comment: CRITICAL RESULT CALLED TO, READ BACK BY AND VERIFIED WITH: Dione Mccombie BESANTI AT 0600 ON 09/26/2019 [place].    Candida albicans NOT DETECTED NOT DETECTED Final   Candida glabrata NOT DETECTED NOT DETECTED Final   Candida krusei NOT DETECTED NOT DETECTED Final   Candida parapsilosis NOT DETECTED NOT DETECTED Final   Candida tropicalis NOT DETECTED NOT DETECTED Final    Comment: Performed at [facility], [address] [place], [place] [address]  SARS CORONAVIRUS 2 (TAT 6-24 HRS) Nasopharyngeal Nasopharyngeal Swab     Status: Abnormal   Collection Time: 09/23/19  9:06 PM   Specimen: Nasopharyngeal Swab  Result Value Ref Range Status   SARS Coronavirus 2  POSITIVE (A) NEGATIVE Final    Comment: RESULT CALLED TO, READ BACK BY AND VERIFIED WITH: Chauncy Passy, RN [facility]) AT [phone] ON 09/24/19 BY C. JESSUP, MT. (NOTE) SARS-CoV-2 target nucleic acids are DETECTED. The SARS-CoV-2 RNA is generally detectable in upper and lower respiratory specimens during the acute phase of infection. Positive results are indicative of the presence of SARS-CoV-2 RNA. Clinical correlation with patient history and other diagnostic information is  necessary to determine patient infection status. Positive results do not rule out bacterial infection or co-infection with other viruses.  The expected result is Negative. Fact Sheet for Patients: SugarRoll.be Fact Sheet for Healthcare Providers: https://www.woods-mathews.com/ This test is not yet approved or cleared by the [place] FDA and  has been authorized for detection and/or diagnosis of SARS-CoV-2 by FDA under an Emergency Use Authorization (EUA). This EUA will remain  in effect (meaning this  test can be used) for the duration of the COVID-19 declaration under Section 564(b)(1) of the Act, 21 U.S.C. section 360bbb-3(b)(1), unless the authorization is terminated or revoked sooner. Performed at [facility], [place] [address], [place], [place] [address]   Urine Culture     Status: None   Collection Time: 09/25/19  7:45 AM   Specimen: Urine, Random  Result Value Ref Range Status   Specimen Description   Final    URINE, RANDOM Performed at [facility], [address] [place], [place] [address]    Special Requests   Final    NONE Performed at [facility], [address], [place], [place] [address]    Culture   Final    NO GROWTH Performed at [facility], [place] [address], [place], [place] [address]    Report Status 09/26/2019 FINAL  Final  MRSA PCR Screening     Status: None   Collection Time: 09/25/19  8:36 AM   Specimen:  Nasopharyngeal  Result Value Ref Range Status   MRSA by PCR NEGATIVE NEGATIVE Final    Comment:        The GeneXpert MRSA Assay (FDA approved for NASAL specimens only), is one component of a comprehensive MRSA colonization surveillance program. It is not intended to diagnose MRSA infection nor to guide or monitor treatment for MRSA infections. Performed at [facility], [address] [place], [place] [address]   C difficile quick scan w PCR reflex     Status: Abnormal   Collection Time: 09/25/19  3:56 PM   Specimen: STOOL  Result Value Ref Range Status   C Diff antigen POSITIVE (A) NEGATIVE Final   C Diff toxin NEGATIVE NEGATIVE Final   C Diff interpretation Results are indeterminate. See PCR results.  Final    Comment: Performed at [facility], [address] [place] [employer] [place] [employer], [place] [address]  C. Diff by PCR, Reflexed     Status: Abnormal   Collection Time: 09/25/19  3:56 PM  Result Value Ref Range Status   Toxigenic C. Difficile by PCR POSITIVE (A) NEGATIVE Final    Comment: Positive for toxigenic C. difficile with little to no toxin production. Only treat if clinical presentation suggests symptomatic illness. Performed at [facility], [address], [place], [place] [address]   Aerobic/Anaerobic Culture (surgical/deep wound)  Status: None   Collection Time: 09/26/19 12:07 PM   Specimen: Bone; Wound  Result Value Ref Range Status   Specimen Description   Final    BONE Performed at [facility], [address] [place], [place] [address]    Special Requests   Final    NONE Performed at [facility], [address] [place], [place] [address]    Gram Stain   Final    FEW WBC PRESENT, PREDOMINANTLY PMN FEW GRAM POSITIVE COCCI FEW GRAM NEGATIVE RODS RARE GRAM POSITIVE RODS    Culture   Final    MODERATE PROTEUS MIRABILIS RARE PSEUDOMONAS AERUGINOSA RARE ENTEROCOCCUS FAECIUM MODERATE BACTEROIDES  THETAIOTAOMICRON BETA LACTAMASE POSITIVE Performed at [facility], [address] [place], [place] [address]    Report Status 09/30/2019 FINAL  Final   Organism ID, Bacteria PROTEUS MIRABILIS  Final   Organism ID, Bacteria PSEUDOMONAS AERUGINOSA  Final   Organism ID, Bacteria ENTEROCOCCUS FAECIUM  Final      Susceptibility   Enterococcus faecium - MIC*    AMPICILLIN >=32 RESISTANT Resistant     VANCOMYCIN >=32 RESISTANT Resistant     GENTAMICIN SYNERGY SENSITIVE Sensitive     LINEZOLID 2 SENSITIVE Sensitive     * RARE ENTEROCOCCUS FAECIUM   Pseudomonas aeruginosa - MIC*    CEFTAZIDIME 4 SENSITIVE Sensitive     CIPROFLOXACIN 0.5 SENSITIVE Sensitive     GENTAMICIN <=1 SENSITIVE Sensitive     IMIPENEM 2 SENSITIVE Sensitive     PIP/TAZO 16 SENSITIVE Sensitive     CEFEPIME 2 SENSITIVE Sensitive     * RARE PSEUDOMONAS AERUGINOSA   Proteus mirabilis - MIC*    AMPICILLIN <=2 SENSITIVE Sensitive     CEFAZOLIN <=4 SENSITIVE Sensitive     CEFEPIME <=0.12 SENSITIVE Sensitive     CEFTAZIDIME <=1 SENSITIVE Sensitive     CEFTRIAXONE <=0.25 SENSITIVE Sensitive     CIPROFLOXACIN <=0.25 SENSITIVE Sensitive     GENTAMICIN <=1 SENSITIVE Sensitive     IMIPENEM 2 SENSITIVE Sensitive     TRIMETH/SULFA <=20 SENSITIVE Sensitive     AMPICILLIN/SULBACTAM <=2 SENSITIVE Sensitive     PIP/TAZO <=4 SENSITIVE Sensitive     * MODERATE PROTEUS MIRABILIS  Culture, blood (Routine X 2) w Reflex to ID Panel     Status: None (Preliminary result)   Collection Time: 09/27/19 12:34 AM   Specimen: Right Antecubital; Blood  Result Value Ref Range Status   Specimen Description RIGHT ANTECUBITAL  Final   Special Requests Blood Culture adequate volume  Final   Culture   Final    NO GROWTH 3 DAYS Performed at [facility], [address], [place], [place] [address]    Report Status PENDING  Incomplete  Culture, blood (Routine X 2) w Reflex to ID Panel     Status: None (Preliminary result)    Collection Time: 09/27/19 12:40 AM   Specimen: BLOOD RIGHT HAND  Result Value Ref Range Status   Specimen Description BLOOD RIGHT HAND  Final   Special Requests Blood Culture adequate volume  Final   Culture   Final    NO GROWTH 3 DAYS Performed at [facility], [address] [place],  [address]    Report Status PENDING  Incomplete    Studies/Results: Left ventricular ejection fraction, by estimation, is 25 to 30%. The left ventricle has severely decreased function. The left ventricle demonstrates regional wall motion abnormalities , anterior , anterospetal and apical wall hypokinesis.  2. Right ventricular systolic function is normal. The right ventricular size is normal. There is mildly elevated pulmonary artery systolic pressure. 3. Left atrial size was mildly dilated. 4. No vegetation noted. IMPRESSION: Large presacral decubitus ulcer with progressive bone destruction of the distal sacrum and coccyx consistent with osteomyelitis. Chronic nonspecific sclerosis within S1 segment of sacrum. Prostatic enlargement with mild bladder wall thickening which could represent muscular hypertrophy from chronic bladder outlet obstruction or from chronic cystitis. Nonobstructing 4 mm calculus distal LEFT ureter. Double-barrel colostomy LEFT lower quadrant.  Assessment/Plan: Pseudomonas bacteremia- source is his long term decub ulcer which also grew Pseudomonas, VRE and proteus and anaerobes from bone biopsy. He has AICD and hence risk  for endocarditis , lead vegetation but TTE is negative. Discussed with Dr Rockey Situ and not a great TEE candidate.  I think we should treat for sacral osteo and follow up after stopping abx. If bacteremia recurs will then need TEE and possible AICD removal but he is not keen to have many procedures done currently.  Stage IV sacral decubitus with chronic osteomyelitis - rec 2 weeks IV abx given + bone bxp.  Would then use tail oral abx  coverage Place PICC - ordered. For now continue cefepime, flagyl. Dc gent. Start dapto for the VRE. We are considering IV abx for home infusion based on cost and ease of administration. Considerations include.- Zosyn 13.5 q 24 continuous infusion - this will cover pseudomonas and proteus and anaerobes. Dapto 8 mg/kg (800 mg)  for the VRE.  We have asked HH to price these out for patient.  Will plan on 2 weeks IV then we will change to oral linezolid and cipro/flagyl to complete another 2 weeks Will continue with wound vac and surgery fu.    Afib on xarelto- which has caused bleeding into the sacral decub and also around the colostomy site before  Cdiff antigen positive , but toxin neg- so likely colonization No specific treatment but monitor for diarrhea.  Thank you very much for the consult. Will follow with you.  Leonel Ramsay   09/30/2019, 2:31 PM

## 2019-09-30 NOTE — Progress Notes (Addendum)
PROGRESS NOTE    Colton Lamb  ZPH:[id number] DOB: 1950/10/29 DOA: 09/23/2019 PCP: Colton Pitch, MD      Brief Narrative:  Colton Lamb is a 69 y.o. 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  Patient had routine labwork at SNF that showed Na 129 and WBC 24K.  In the ER, lactate 2.3 and CXR showed pneumonia and COVID PCR positive.  Started on broad spectrum antibiotics and admitted.      Assessment & Plan:  Pseudomonas bacteremia Sepsis Possible acute on chronic osteomyelitis Possible endocarditis  Patient sent from facility for leukocytosis.  In ER, found to have tachycardia and leukocytosis, as well as blood pressure less than 100.  2 of 2 blood cultures growing Pseudomonas on admission. Bone biopsy obtained, growing Proteus, anaerobic culture still pending.  WBC trending down.  Repeat blood cultures negative now at 48 hours.   ID have concern for ICD infection or endocarditis: TTE negative for vegetation, difficult TEE per Cardiology - Colton Lamb see today to decide Abx plans at D/C. Likely will need IV Abx for 2-4 weeks -Continue cefepime and gentamicin for now -Trough monitoring per RPh -Daily BMP -Continue Flagyl until anaerobe bone culture returns  -Consult infectious disease, appreciate expertise -- Colton Lamb see today (09/30/2019)  -Wound offloading, air mattress, wound care/Aquacel  Gen Surg to place wound vac today (09/30/2019) and arrange [facility]-If able to consistently offload the wound, Gen Surg to make outpatient plastic surgery consultation for skin flap  -Has 4-6 week follow up with Dr. RDelaine Lamearranged -[facility], possibly for OPAT depending on ID recommendation    COVID-19 asymptomatic Presented with incidental COVID+ PCR in the context of the ongoing 2020-2021 COVID-19 pandemic.  Possible atelectasis on CXR, no hypoxia or respiratory symptoms.  In the absence of significant respiratory disease, the utility of remdesivir is unclear, and possible harm from steroid use given his osteomyelitis likely outweighs benefit.   -Keep on isolation until Mar 4  Doubted Cdiff colitis Stool testing showed toxinogenic cdiff. ID have evaluated and we both feel colitis is unlikely -- no treatment necessary.  AKI Hyponatremia Due to dehydration.  Cr 1.4 on admission, up from baseline 1.0.  Cr normalized -Monitor Cr on gentamicin  Chronic systolic CHF No evidence of fluid overload -Hold torsemide given hypotension  Chronic hypotension -Continue midodrine  Atrial flutter, typical HR controlled -Continue Xarelto, digoxin -Hold metoprolol given hypotension  Coronary disease secondary prevention -Continue aspirin and Xarelto and atorvastatin -Hold metoprolol  Diabetes Glucose controlled -Continue SS and mealtime insulin  Crohn's disease   -Continue balsalazide  Anemia of chronic disease Recent baseline appears to be 7-9 g/dL, Stable within range, no clinical bleeding other than scant oozing at wound.  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  Disposition: The patient was admitted with abnormal labs, found to have early  sepsis from I suspect pelvic osteo, maybe endocarditis or lead infection.    At present we are awaiting final bone culture and work-up for endocarditis or ICD lead infection.  Once bone culture has finalized, and ID are confident in ruling in or out endocarditis/lead infection, we will arrange a plan for outpatient antibiotics.   PT recommend SNF.  Due to personal considerations, patient has firmly requested discharge home.  We are exploring SNF options, but concurrently discussing with family to provide support at home and discharge to home with wound vac, HHPT and wound VAC nurse, general surgery follow-up, ID follow-up, and pressure offloading bed.     Overall poor prognosis.  Recommend palliative care to follow at home   MDM: The below labs and imaging reports reviewed and summarized above.  Medication management as above.    DVT prophylaxis: N/A on Xarelto Code Status: FULL Family Communication: Attempted to reach Colton Lamb by phone. no answer, left VM D/w son Colton Lamb who says patient makes his own decision even though family may not agree with same.  They want to respect his wishes.  Patient is adamant in wanting to go home.  Will set up palliative care at discharge as son is in agreement.  He knows he will die the way he is going.  He is continued to have decline over last several months to years    Consultants:   Infectious disease  General Surgery  Cardiology   Procedures:   2/23 CT pelvis -- osteomyelitis  2/25 bone biopsy  2/27 echo -- no vegetation  TEE pending -doubt this will be possible per cardiology  Antimicrobials:   Vancomycin last dose 2/23 at 2200  Ceftriaxone x1 on 2/23  Azithromycin last dose on 2/23   Oral vancomycin 2/25 >> 2/26  Cefepime 2/22 >>   Gentamicin 2/26 >>  Culture data:   2/22 blood culture -  Pseudomonas in 2/2  Urine culture No growth  2/25 bone biopsy -- proteus, reincubated for anaerobes  2/26 repeat blood  cultures x2 -- NGTD    Subjective: Denies any new complaint.  Seems comfortable.  Waiting for wound VAC change today.  Blood pressure low but stable     Objective: Vitals:   09/29/19 0701 09/29/19 0721 09/30/19 0123 09/30/19 0700  BP: (!) 80/47 (!) 83/56 (!) 87/52 93/60  Pulse: 65 61 82 72  Resp: 18 18 14 14   Temp:  98.2 F (36.8 C) 97.9 F (36.6 C) 97.7 F (36.5 C)  TempSrc:    Oral  SpO2: 100% 100% 98% 98%  Weight:      Height:        Intake/Output Summary (Last 24 hours) at 09/30/2019 1156 Last data filed at 09/30/2019 0600 Gross per 24 hour  Intake 1458.8 ml  Output --  Net 1458.8 ml   Filed Weights   09/23/19 1629  Weight: 108.9 kg    Examination: General appearance: Chronically ill-appearing elderly adult male, sleeping but easily arousable and in no acute distress.   HEENT: Anicteric, conjunctiva pink, lids and lashes normal. No nasal deformity, discharge, epistaxis.  Lips moist, mostly edentulous, oropharynx moist, no oral lesions, hearing normal   Skin: Warm and dry.  No suspicious rashes or lesions.  Pale.  Sacral ulcer not evaluated today. Cardiac: RRR, no murmurs appreciated.  No LE edema.    Respiratory: Normal respiratory rate and rhythm.  CTAB without rales or wheezes.  Abdomen: Abdomen soft.  No grimace to palpation, no guarding, no rigidity or rebound. No ascites, distension, hepatosplenomegaly.   MSK: No deformities or effusions of the large joints of the upper or lower extremities bilaterally.  Severe diffuse loss of subcutaneous muscle mass and fat, severe atrophy of the legs bilaterally, and including near complete effacement of the hamstrings and calves Neuro: Awake. Naming is grossly intact, and the patient's recall, recent and remote, as well as general fund of knowledge seem within normal limits.  Muscle tone massively diminished, without fasciculations.  Moves upper extremities with generalized weakness, weak coordination.  Lower extremity strength  is extremely weak.  He has inability to lift the legs off the bed against gravity.  Inability to flex legs at the knee more than a few inches.  Speech fluent.  Psych: Sensorium intact and responding to questions, attention normal. Affect blunted.  Judgment and insight appear normal.         Data Reviewed: I have personally reviewed following labs and imaging studies:  CBC: Recent Labs  Lab 09/23/19 1723 09/23/19 1723 09/24/19 0138 09/25/19 0446 09/26/19 0401 09/27/19 0035 09/28/19 0525 09/29/19 0452 09/30/19 0311  WBC 29.7*   < > 24.6*   < > 20.1* 24.2* 22.8* 19.1* 20.2*  NEUTROABS 25.7*  --  21.2*  --   --   --   --   --   --   HGB 10.9*   < > 9.6*   < > 9.6* 8.0* 8.1* 8.0* 8.2*  HCT 35.2*   < > 31.1*   < > 31.7* 26.1* 26.1* 26.6* 26.8*  MCV 81.3   < > 81.4   < > 81.7 81.3 82.1 83.1 82.7  PLT 333   < > 278   < > 244 194 179 160 145*   < > = values in this interval not displayed.   Basic Metabolic Panel: Recent Labs  Lab 09/23/19 1740 09/24/19 0138 09/25/19 0446 09/25/19 0446 09/26/19 0401 09/27/19 0035 09/28/19 0525 09/29/19 0452 09/30/19 0311  NA 134*   < > 136   < > 139 135 137 138 140  K 3.5   < > 3.5   < > 3.4* 3.7 3.9 3.3* 3.7  CL 85*   < > 96*   < > 98 99 103 103 107  CO2 32   < > 29   < > 30 28 26 24 25   GLUCOSE 172*   < > 266*   < > 131* 151* 144* 107* 123*  BUN 50*   < > 41*   < > 39* 38* 38* 29* 28*  CREATININE 1.43*   < > 1.06   < > 0.88 0.88 0.85 0.79  0.74 0.63  CALCIUM 9.0   < > 8.4*   < > 9.0 8.6* 8.6* 8.6* 8.7*  MG 1.8  --  2.1  --  2.0 1.8  --   --   --   PHOS 2.9  --   --   --   --   --   --   --   --    < > = values in this interval not displayed.   GFR: Estimated Creatinine Clearance: 110.9 mL/min (by C-G formula based on SCr of 0.63 mg/dL). Liver Function Tests: Recent Labs  Lab 09/23/19 1740 09/27/19 0035  AST 24 18  ALT 10 11  ALKPHOS 137* 106  BILITOT 0.8 0.6  PROT 7.3 5.6*  ALBUMIN 2.5* 1.8*   No  results for input(s):  LIPASE, AMYLASE in the last 168 hours. No results for input(s): AMMONIA in the last 168 hours. Coagulation Profile: Recent Labs  Lab 09/24/19 0138  INR 2.5*   Cardiac Enzymes: No results for input(s): CKTOTAL, CKMB, CKMBINDEX, TROPONINI in the last 168 hours. BNP (last 3 results) No results for input(s): PROBNP in the last 8760 hours. HbA1C: No results for input(s): HGBA1C in the last 72 hours. CBG: Recent Labs  Lab 09/29/19 1116 09/29/19 1629 09/29/19 2058 09/30/19 0740 09/30/19 1120  GLUCAP 138* 147* 75 128* 149*   Lipid Profile: No results for input(s): CHOL, HDL, LDLCALC, TRIG, CHOLHDL, LDLDIRECT in the last 72 hours. Thyroid Function Tests: No results for input(s): TSH, T4TOTAL, FREET4, T3FREE, THYROIDAB in the last 72 hours. Anemia Panel: No results for input(s): VITAMINB12, FOLATE, FERRITIN, TIBC, IRON, RETICCTPCT in the last 72 hours. Urine analysis:    Component Value Date/Time   COLORURINE AMBER (A) 09/23/2019 1817   APPEARANCEUR TURBID (A) 09/23/2019 1817   LABSPEC 1.013 09/23/2019 1817   PHURINE 5.0 09/23/2019 1817   GLUCOSEU NEGATIVE 09/23/2019 1817   HGBUR NEGATIVE 09/23/2019 1817   BILIRUBINUR NEGATIVE 09/23/2019 1817   KETONESUR NEGATIVE 09/23/2019 1817   PROTEINUR NEGATIVE 09/23/2019 1817   UROBILINOGEN 1.0 11/27/2013 1017   NITRITE NEGATIVE 09/23/2019 1817   LEUKOCYTESUR LARGE (A) 09/23/2019 1817      Scheduled Meds: . ascorbic acid  500 mg Oral BID  . aspirin EC  81 mg Oral Daily  . atorvastatin  80 mg Oral Daily  . balsalazide  750 mg Oral TID  . Chlorhexidine Gluconate Cloth  6 each Topical Q0600  . digoxin  0.125 mg Oral Daily  . finasteride  5 mg Oral Daily  . insulin aspart  0-5 Units Subcutaneous QHS  . insulin aspart  0-9 Units Subcutaneous TID WC  . insulin aspart  5 Units Subcutaneous TID AC  . lactobacillus  1 g Oral TID WC  . metroNIDAZOLE  500 mg Oral Q8H  . midodrine  10 mg Oral TID  . multivitamin-lutein  1 capsule Oral  Daily  . niacin  1,000 mg Oral QHS  . pantoprazole  40 mg Oral Daily  . rivaroxaban  20 mg Oral Q lunch  . sertraline  25 mg Oral Daily  . sodium chloride flush  3 mL Intravenous Q12H  . tamsulosin  0.4 mg Oral QPC breakfast   Continuous Infusions: . sodium chloride 10 mL/hr at 09/30/19 0600  . ceFEPime (MAXIPIME) IV 2 g (09/30/19 0804)  . gentamicin Stopped (09/29/19 2318)     LOS: 7 days    Time spent: 25 minutes    Max Sane, MD [facility] 09/30/2019, 11:56 AM     Please page though [place] or Epic secure chat:  For [employer], Adult nurse

## 2019-09-30 NOTE — Care Management Important Message (Signed)
Important Message  Patient Details  Name: LUCIOUS ZOU MRN: [id number] Date of Birth: 1951-01-04   Medicare Important Message Given:  Yes     Shelbie Hutching, RN 09/30/2019, 11:53 AM

## 2019-09-30 NOTE — Progress Notes (Signed)
CRITICAL VALUE ALERT  Critical Value:  Gentamicin 10.3  Date & Time Notied: 09/30/2019 12:30  Provider Notified: Dr. Manuella Ghazi  Orders Received/Actions taken: lab was done on this morning's lab draw, lab is coming to draw a new one for more accurate level

## 2019-09-30 NOTE — Progress Notes (Signed)
Received order for PICC  Plan on placing tomorrow am.

## 2019-09-30 NOTE — Progress Notes (Signed)
Physical Therapy Treatment Patient Details Name: Colton Lamb MRN: [id number] DOB: [birth year] Today's Date: 09/30/2019    History of Present Illness Per MD note: 69 y.o. 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  Also found to be COVID positive. Being treated for pseudomonas bacteremia and possible acute on chronic osteomyelitis. Patient has multiple pressure ulcers present on arrival. Pt well known to therapy and reports he hasn't ambulated in almost 1 year. Has been total care at [facility].    PT Comments    Agrees to session.  Participated in exercises as described below.  Pt with generally good effort with ex 2 x 10 with little fatigue.  Hesitantly agrees to attempts at sitting.  Mod a x 2 to initiate movement.  He is able to get 1/2 way sitting before initiating return to supine.  Stated it is too painful.  Mobility attempts remains limited due to pain with sitting unable to tolerate to transition to standing.  Co-tx with OT (evaluation) due to increased assist needs   Follow Up Recommendations  SNF     Equipment Recommendations  Hospital bed    Recommendations for Other Services       Precautions / Restrictions Precautions Precautions: Fall Restrictions Weight Bearing Restrictions: No    Mobility  Bed Mobility Overal bed mobility: Needs Assistance Bed Mobility: Rolling;Supine to Sit Rolling: Mod assist   Supine to sit: Mod assist;+2 for physical assistance     General bed mobility comments: pt is able to initiate supine to sit but only gets 1/2 way up before returning to supine due to pain  Transfers                    Ambulation/Gait                 Stairs             Wheelchair Mobility    Modified Rankin (Stroke Patients Only)        Balance                                            Cognition Arousal/Alertness: Awake/alert Behavior During Therapy: WFL for tasks assessed/performed Overall Cognitive Status: Within Functional Limits for tasks assessed                                 General Comments: very pleasant and agreeable to work with therapy      Exercises Other Exercises Other Exercises: 2 x 10 - ankle pumps, heel slides, ab/add and slr with only tactile cues for ROM  overall does quite well with ex.    General Comments        Pertinent Vitals/Pain Pain Assessment: Faces Faces Pain Scale: Hurts even more Pain Location: coccyx with attempt at mobility Pain Descriptors / Indicators: Discomfort Pain Intervention(s): Limited activity within patient's tolerance;Monitored during session    Home Living                      Prior Function            PT Goals (current goals can now  be found in the care plan section) Progress towards PT goals: Progressing toward goals    Frequency    Min 2X/week      PT Plan Current plan remains appropriate    Co-evaluation PT/OT/SLP Co-Evaluation/Treatment: Yes Reason for Co-Treatment: For patient/therapist safety;Complexity of the patient's impairments (multi-system involvement) PT goals addressed during session: Mobility/safety with mobility;Strengthening/ROM OT goals addressed during session: Other (comment)(evaluation)      AM-PAC PT "6 Clicks" Mobility   Outcome Measure  Help needed turning from your back to your side while in a flat bed without using bedrails?: A Lot Help needed moving from lying on your back to sitting on the side of a flat bed without using bedrails?: Total Help needed moving to and from a bed to a chair (including a wheelchair)?: Total Help needed standing up from a chair using your arms (e.g., wheelchair or bedside chair)?: Total Help needed to walk in hospital room?: Total Help  needed climbing 3-5 steps with a railing? : Total 6 Click Score: 7    End of Session   Activity Tolerance: Patient tolerated treatment well;Patient limited by pain Patient left: in bed;with call bell/phone within reach;with bed alarm set Nurse Communication: Mobility status       Time: 5643-3295 PT Time Calculation (min) (ACUTE ONLY): 33 min  Charges:  $Therapeutic Exercise: 8-22 mins $Therapeutic Activity: 8-22 mins                     Chesley Noon, PTA 09/30/19, 2:53 PM

## 2019-09-30 NOTE — TOC Progression Note (Signed)
Transition of Care [facility]) - Progression Note    Patient Details  Name: Colton Lamb MRN: [id number] Date of Birth: [birth year]  Transition of [facility]) CM/SW Contact  Shelbie Hutching, RN Phone Number: 09/30/2019, 11:53 AM  Clinical Narrative:    RNCM participated in bedside rounds.  Staff RN reports that patient has not been eating for her.  Plan for wound vac placement at bedside by general surgery, wound vac is on the unit ready for placement.    Expected Discharge Plan: [place] Barriers to Discharge: Continued Medical Work up  Expected Discharge Plan and Services Expected Discharge Plan: [place]   Discharge Planning Services: CM Consult Post Acute Care Choice: [place] arrangements for the past 2 months: [place]: RN, PT, OT, Nurse's Aide, Social Work [facility] Agency: [facility] [place] Date [place]: 09/27/19 Time [place]: 1400 Representative spoke with at [place]: [place] ([place]) Interventions    Readmission Risk Interventions Readmission Risk Prevention Plan 07/18/2019 07/12/2019 06/14/2019  Transportation Screening Complete Complete Complete  Social Work Consult for [place] - - -  Medication Review Press photographer) Complete Complete Complete  PCP or Specialist appointment within 3-5 days of discharge (No Data) - Complete  HRI or [place] - Not Complete -  HRI or Home Care Consult Pt Refusal Comments return to SNF Return to SNF -  SW Recovery Care/Counseling Consult - Complete Complete  Palliative Care Screening - Not Applicable Not Applicable  Skilled Nursing Facility Complete Complete Complete  Some recent data might be hidden

## 2019-09-30 NOTE — Consult Note (Signed)
Pharmacy Antibiotic Note  Colton Lamb is a 69 y.o. male admitted on 09/23/2019 with bacteremia.  Pharmacy has been consulted for daptomycin dosing.  VRE in bone culture from sacral wound (as well as pseudomonas,  bacteroides and P. Mirabilis).  Pseudomonas in blood culture (repeat culture no growth to date).     Plan:  Daptomycin 845m (~870mkg)  IV q24h for VRE in wound  Check CK in am then weekly  Stop atorvastatin for now (d/w ID)  Continue cefepime 2gm IV q8h for now  Await cost for antibiotics for home infusion to determine plan    Height: 5' 11"  (180.3 cm) Weight: 240 lb (108.9 kg) IBW/kg (Calculated) : 75.3  Temp (24hrs), Avg:97.8 F (36.6 C), Min:97.7 F (36.5 C), Max:97.9 F (36.6 C)  Recent Labs  Lab 09/23/19 1703 09/23/19 1723 09/23/19 1903 09/23/19 2146 09/23/19 2307 09/24/19 0138 09/25/19 0446 09/26/19 0401 09/27/19 0035 09/28/19 0525 09/28/19 1529 09/28/19 2140 09/29/19 0452 09/30/19 0311 09/30/19 1309  WBC  --    < >  --   --   --  24.6*   < > 20.1* 24.2* 22.8*  --   --  19.1* 20.2*  --   CREATININE  --    < >  --   --   --  1.42*   < > 0.88 0.88 0.85  --   --  0.79  0.74 0.63  --   LATICACIDVEN 1.9  --  2.3* 1.8 1.4 1.3  --   --   --   --   --   --   --   --   --   GENTTROUGH  --   --   --   --   --   --   --   --   --   --   --  8.5*  --   --   --   GENTPEAK  --   --   --   --   --   --   --   --   --   --  13.3*  --   --   --   --   GENTRANDOM  --   --   --   --   --   --   --   --   --   --   --   --   --  10.3 5.5   < > = values in this interval not displayed.    Estimated Creatinine Clearance: 110.9 mL/min (by C-G formula based on SCr of 0.63 mg/dL).    Allergies  Allergen Reactions  . Iodine Other (See Comments)    Shortness of breath, swelling and hives  . Shrimp [Shellfish Allergy] Other (See Comments)    SWELLING    HIVES    SHORTNESS OF BREATH  . Tetracycline Rash    Antimicrobials this admission: 2/22 cefepime >>  2/26  gentamicin >> 3/1 3/1 daptomycin >>  Dose adjustments this admission: None  Microbiology results: 2/22 BCx: PSA pan-sensitive  2/24 UCx: NGTD  2/24 MRSA PCR: negative 2/25 Wound: pseudomonas, VRE, P. Mirabilis, bacteroides  Thank you for allowing pharmacy to be a part of this patient's care.  DuDoreene ElandPharmD, BCPS.   Work Cell: [phone]/08/2019 3:34 PM

## 2019-10-01 ENCOUNTER — Inpatient Hospital Stay: Payer: Medicare Other

## 2019-10-01 DIAGNOSIS — Z0279 Encounter for issue of other medical certificate: Secondary | ICD-10-CM

## 2019-10-01 DIAGNOSIS — Z0189 Encounter for other specified special examinations: Secondary | ICD-10-CM

## 2019-10-01 LAB — BASIC METABOLIC PANEL
Anion gap: 8 (ref 5–15)
BUN: 25 mg/dL — ABNORMAL HIGH (ref 8–23)
CO2: 27 mmol/L (ref 22–32)
Calcium: 8.8 mg/dL — ABNORMAL LOW (ref 8.9–10.3)
Chloride: 109 mmol/L (ref 98–111)
Creatinine, Ser: 0.82 mg/dL (ref 0.61–1.24)
GFR calc Af Amer: >60 mL/min (ref >60)
GFR calc non Af Amer: >60 mL/min (ref >60)
Glucose, Bld: 119 mg/dL — ABNORMAL HIGH (ref 70–99)
Potassium: 3.7 mmol/L (ref 3.5–5.1)
Sodium: 144 mmol/L (ref 135–145)

## 2019-10-01 LAB — CBC
HCT: 28.1 % — ABNORMAL LOW (ref 39.0–52.0)
Hemoglobin: 8.5 g/dL — ABNORMAL LOW (ref 13.0–17.0)
MCH: 25.7 pg — ABNORMAL LOW (ref 26.0–34.0)
MCHC: 30.2 g/dL (ref 30.0–36.0)
MCV: 84.9 fL (ref 80.0–100.0)
Platelets: 148 10*3/uL — ABNORMAL LOW (ref 150–400)
RBC: 3.31 MIL/uL — ABNORMAL LOW (ref 4.22–5.81)
RDW: 21.4 % — ABNORMAL HIGH (ref 11.5–15.5)
WBC: 16.2 10*3/uL — ABNORMAL HIGH (ref 4.0–10.5)
nRBC: 0 % (ref 0.0–0.2)

## 2019-10-01 LAB — GLUCOSE, CAPILLARY
Glucose-Capillary: 106 mg/dL — ABNORMAL HIGH (ref 70–99)
Glucose-Capillary: 158 mg/dL — ABNORMAL HIGH (ref 70–99)
Glucose-Capillary: 144 mg/dL — ABNORMAL HIGH (ref 70–99)
Glucose-Capillary: 169 mg/dL — ABNORMAL HIGH (ref 70–99)

## 2019-10-01 LAB — CK: Total CK: 12 U/L — ABNORMAL LOW (ref 49–397)

## 2019-10-01 MED ORDER — LINEZOLID 600 MG PO TABS: 600.0000 mg | ORAL_TABLET | Freq: Two times a day (BID) | ORAL | 0 refills | Status: DC

## 2019-10-01 MED ORDER — LINEZOLID 600 MG PO TABS
600.0000 mg | ORAL_TABLET | Freq: Two times a day (BID) | ORAL | Status: DC
  Administered 2019-10-01 – 2019-10-02 (×2): 600 mg via ORAL
  Filled 2019-10-01 (×4): qty 1

## 2019-10-01 MED ORDER — SODIUM CHLORIDE 0.9% FLUSH
10.0000 mL | Freq: Two times a day (BID) | INTRAVENOUS | Status: DC
  Administered 2019-10-01 – 2019-10-02 (×3): 10 mL

## 2019-10-01 MED ORDER — PIPERACILLIN-TAZOBACTAM 3.375 G IVPB
3.3750 g | Freq: Three times a day (TID) | INTRAVENOUS | Status: DC
  Administered 2019-10-01 – 2019-10-02 (×3): 3.375 g via INTRAVENOUS
  Filled 2019-10-01 (×7): qty 50

## 2019-10-01 MED ORDER — ATORVASTATIN CALCIUM 20 MG PO TABS
80.0000 mg | ORAL_TABLET | Freq: Every day | ORAL | Status: DC
  Administered 2019-10-02: 17:00:00 80 mg via ORAL
  Filled 2019-10-01: qty 4

## 2019-10-01 MED ORDER — SODIUM CHLORIDE 0.9% FLUSH: 10.0000 mL | INTRAVENOUS | Status: DC | PRN

## 2019-10-01 NOTE — Progress Notes (Signed)
Patient has not c/o pain to me. PT called after they had worked with him and they got him up to the side of the bed and he was in a lot pain with his left hip. Messaged Dr. Manuella Ghazi about same and he put in a new order for a left hip xray.

## 2019-10-01 NOTE — Progress Notes (Signed)
Peripherally Inserted Central Catheter/Midline Placement  The IV Nurse has discussed with the patient and/or persons authorized to consent for the patient, the purpose of this procedure and the potential benefits and risks involved with this procedure.  The benefits include less needle sticks, lab draws from the catheter, and the patient may be discharged home with the catheter. Risks include, but not limited to, infection, bleeding, blood clot (thrombus formation), and puncture of an artery; nerve damage and irregular heartbeat and possibility to perform a PICC exchange if needed/ordered by physician.  Alternatives to this procedure were also discussed.  Bard Power PICC patient education guide, fact sheet on infection prevention and patient information card has been provided to patient /or left at bedside.    PICC/Midline Placement Documentation  PICC Single Lumen 53/61/44 PICC Right Basilic 38 cm 0 cm (Active)  Indication for Insertion or Continuance of Line Prolonged intravenous therapies 10/01/19 1136  Exposed Catheter (cm) 0 cm 10/01/19 1136  Site Assessment Clean;Dry;Intact 10/01/19 1136  Line Status Flushed;Blood return noted;Saline locked 10/01/19 1136  Dressing Type Transparent 10/01/19 1136  Dressing Status Clean;Dry;Intact;Antimicrobial disc in place 10/01/19 1136  Dressing Change Due 10/08/19 10/01/19 1136       [address] 10/01/2019, 11:37 AM

## 2019-10-01 NOTE — Progress Notes (Signed)
PHARMACY CONSULT NOTE FOR:  OUTPATIENT  PARENTERAL ANTIBIOTIC THERAPY (OPAT)  Indication: pseudomonas bacteremia and sacral decubitus Regimen: piperacillin/tazobactam 13.5gm over 24 hours as continuous infusion End date: 10/21/2019  - linezolid prior authorization required - in process.  Plan 2 weeks therapy at discharge  IV antibiotic discharge orders are pended. To discharging provider:  please sign these orders via discharge navigator,  Select New Orders & click on the button choice - Manage This Unsigned Work.     Thank you for allowing pharmacy to be a part of this patient's care.  Doreene Eland, PharmD, BCPS.   Work Cell: [phone] 10/01/2019 12:32 PM

## 2019-10-01 NOTE — Progress Notes (Signed)
Physical Therapy Treatment Patient Details Name: Colton Lamb MRN: [id number] DOB: Jun 06, 1951 Today's Date: 10/01/2019    History of Present Illness Per MD note: 69 y.o. 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  Also found to be COVID positive. Being treated for pseudomonas bacteremia and possible acute on chronic osteomyelitis. Patient has multiple pressure ulcers present on arrival. Pt well known to therapy and reports he hasn't ambulated in almost 1 year. Has been total care at [facility].    PT Comments    Pt alert, oriented, agreeable to PT. PT and patient performed several supine exercises, physical assistance needed for SLR and hip abduction. Pt bed soaked with urine, NT notified and in room to assist. Pt performed rolling to R and L modA to complete when patient able to participate in rolling and use of bed rails. Once in L sidelying, sidelying to sit performed with maxAx2, assistance needed for LE management and trunk elevation. Pt reported significant L hip pain with movement as well (including with rolling in bed).  Pt needed maximum support to maintain balance 1-2 people during session despite cueing, repositioning UE and LE support. Able to maintain sitting for several minutes with encouragement. Pt repositioned in sidelying with modAx2, and agreeable to maintain positioning for off weighting wound at this time. All needs in reach at end of session. The patient would benefit from further skilled PT intervention to maximize safety, mobility, and independence.     Follow Up Recommendations  SNF     Equipment Recommendations  Hospital bed    Recommendations for Other Services       Precautions / Restrictions Precautions Precautions: Fall Precaution Comments: sacral  wound Restrictions Weight Bearing Restrictions: No    Mobility  Bed Mobility Overal bed mobility: Needs Assistance Bed Mobility: Rolling;Sit to Sidelying;Sidelying to Sit Rolling: Mod assist Sidelying to sit: Max assist;+2 for physical assistance     Sit to sidelying: Mod assist;+2 for safety/equipment General bed mobility comments: Pt performed rolling to L and R to allow for linen change/cleaning after urinating in bed. sidelying to sit maxAx2 for trunk elevation and LE management.  Transfers                 General transfer comment: unable  Ambulation/Gait                 Stairs             Wheelchair Mobility    Modified Rankin (Stroke Patients Only)       Balance Overall balance assessment: Needs assistance Sitting-balance support: Feet supported;Feet unsupported Sitting balance-Leahy Scale: Zero Sitting balance - Comments: Pt needed maximum support to maintain balance 1-2 people during session despite cueing, repositioning UE and LE support. Able to maintain sitting for several minutes with encouragement.                                    Cognition Arousal/Alertness: Awake/alert Behavior During Therapy: WFL for tasks assessed/performed Overall Cognitive Status: Within Functional Limits for tasks assessed                                 General Comments: agreeable to work  with therapy, oriented, good cue following      Exercises General Exercises - Lower Extremity Ankle Circles/Pumps: AROM;Strengthening;Both;10 reps Heel Slides: AAROM;Strengthening;Both;10 reps Hip ABduction/ADduction: AAROM;Strengthening;Both;10 reps Straight Leg Raises: AAROM;Strengthening;Both;10 reps    General Comments        Pertinent Vitals/Pain Pain Assessment: Faces Faces Pain Scale: Hurts even more Pain Location: pt complained of L hip pain Pain Descriptors / Indicators: Grimacing;Moaning Pain Intervention(s): Limited  activity within patient's tolerance;Monitored during session;Repositioned    Home Living                      Prior Function            PT Goals (current goals can now be found in the care plan section) Progress towards PT goals: Progressing toward goals    Frequency    Min 2X/week      PT Plan Current plan remains appropriate    Co-evaluation PT/OT/SLP Co-Evaluation/Treatment: Yes Reason for Co-Treatment: Complexity of the patient's impairments (multi-system involvement);For patient/therapist safety;To address functional/ADL transfers PT goals addressed during session: Mobility/safety with mobility;Strengthening/ROM;Balance OT goals addressed during session: ADL's and self-care      AM-PAC PT "6 Clicks" Mobility   Outcome Measure  Help needed turning from your back to your side while in a flat bed without using bedrails?: A Lot Help needed moving from lying on your back to sitting on the side of a flat bed without using bedrails?: Total Help needed moving to and from a bed to a chair (including a wheelchair)?: Total Help needed standing up from a chair using your arms (e.g., wheelchair or bedside chair)?: Total Help needed to walk in hospital room?: Total Help needed climbing 3-5 steps with a railing? : Total 6 Click Score: 7    End of Session   Activity Tolerance: Patient limited by pain;Patient limited by fatigue Patient left: in bed;with call bell/phone within reach;with bed alarm set Nurse Communication: Mobility status PT Visit Diagnosis: Muscle weakness (generalized) (M62.81);Difficulty in walking, not elsewhere classified (R26.2)     Time: 8466-5993 PT Time Calculation (min) (ACUTE ONLY): 41 min  Charges:  $Therapeutic Exercise: 23-37 mins                     Lieutenant Diego PT, DPT 3:21 PM,10/01/19

## 2019-10-01 NOTE — Consult Note (Signed)
Pharmacy Antibiotic Note  Colton Lamb is a 69 y.o. male admitted on 09/23/2019 with bacteremia.  Pharmacy has been consulted for daptomycin dosing.  VRE in bone culture from sacral wound (as well as pseudomonas,  bacteroides and P. Mirabilis).  Pseudomonas in blood culture (repeat culture no growth to date).    CK 12  Plan:  Daptomycin 830m (~848mkg)  IV q24h for VRE in wound  Check CK weekly  Stop atorvastatin for now (d/w ID)  Continue cefepime 2gm IV q8h for now  Await cost for antibiotics for home infusion to determine plan    Height: 5' 11"  (180.3 cm) Weight: 240 lb (108.9 kg) IBW/kg (Calculated) : 75.3  Temp (24hrs), Avg:97.9 F (36.6 C), Min:97.7 F (36.5 C), Max:98 F (36.7 C)  Recent Labs  Lab 09/27/19 0035 09/28/19 0525 09/28/19 1529 09/28/19 2140 09/29/19 0452 09/30/19 0311 09/30/19 1309 10/01/19 0520  WBC 24.2* 22.8*  --   --  19.1* 20.2*  --  16.2*  CREATININE 0.88 0.85  --   --  0.79  0.74 0.63  --  0.82  GENTTROUGH  --   --   --  8.5*  --   --   --   --   GENTPEAK  --   --  13.3*  --   --   --   --   --   GENTRANDOM  --   --   --   --   --  10.3 5.5  --     Estimated Creatinine Clearance: 108.2 mL/min (by C-G formula based on SCr of 0.82 mg/dL).    Allergies  Allergen Reactions  . Iodine Other (See Comments)    Shortness of breath, swelling and hives  . Shrimp [Shellfish Allergy] Other (See Comments)    SWELLING    HIVES    SHORTNESS OF BREATH  . Tetracycline Rash    Antimicrobials this admission: 2/22 cefepime >>  2/26 gentamicin >> 3/1 3/1 daptomycin >>  Dose adjustments this admission: None  Microbiology results: 2/22 BCx: PSA pan-sensitive  2/24 UCx: NGTD  2/24 MRSA PCR: negative 2/25 Wound: pseudomonas, VRE, P. Mirabilis, bacteroides  Thank you for allowing pharmacy to be a part of this patient's care.  AsKristeen MissPharmD Clinical Pharmacist  10/01/2019 7:33 AM
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
## 2019-10-01 NOTE — Progress Notes (Addendum)
Pharmacy - Brief Note  Plan per ID is zosyn continuous infusion and PO linezolid to cover bacteremia and wound cultures.  Daptomycin cost ~$1400/wk.  Insurance required prior auth for linezolid which was approved.  Per [facility], patient's copay is $168. I discussed with patient on phone if he can afford this and he said he would be able to pay for it.  I called [facility] and told them he can afford and will order for 3/3 (be available for pick-up after 12pm)   Addendum 10/02/2019 4:33 PM Discussed with patient how to take linezolid, duration, and signs of serotonin syndrome (sweating, diarrhea, confusion, muscle twitching/ridgidity, rapid heart rate, or fever).  Instructed him to call physician and stop linezolid if any of these symptoms occur.    Doreene Eland, PharmD, BCPS.   Work Cell: [phone] 10/01/2019 2:24 PM

## 2019-10-01 NOTE — Progress Notes (Signed)
PROGRESS NOTE    Colton Lamb  DZH:[id number] DOB: Nov 19, 1950 DOA: 09/23/2019 PCP: [name], MD      Brief Narrative:  Colton Lamb is a 69 y.o. 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  Patient had routine labwork at SNF that showed Na 129 and WBC 24K.  In the ER, lactate 2.3 and CXR showed pneumonia and COVID PCR positive.  Started on broad spectrum antibiotics and admitted.    Assessment & Plan:  Pseudomonas bacteremia, Sepsis due to sacral decub's acute on chronic osteomyelitis Patient sent from facility for leukocytosis.  In ER, found to have tachycardia and leukocytosis, as well as blood pressure less than 100. 2 of 2 blood cultures growing Pseudomonas on admission. Bone biopsy obtained, growing Proteus,  WBC trending down.  Repeat blood cultures negative now at 48 hours.  - wound vac placed on 3/1 by surgery - outpt f/up with Dr RChristian Mateat wound clinic - Wound offloading, air mattress, wound care/Aquacel -If able to consistently offload the wound, Gen Surg to make outpatient plastic surgery consultation for skin flap  -Has 4-6 week follow up with Dr. RDelaine Lamearranged -[facility] - per ID -> Zosyn IV for 4 weeks and zyvox PO for 2 weeks - OPAT orders in place  COVID-19 asymptomatic Presented with incidental COVID+ PCR in the context of the ongoing 2020-2021 COVID-19 pandemic.  Possible atelectasis on CXR, no hypoxia or respiratory symptoms. -Keep on isolation until Mar 4  Doubted Cdiff colitis Stool testing showed toxinogenic cdiff. ID have evaluated and we both feel colitis is unlikely -- no treatment necessary.  AKI Hyponatremia Due to dehydration.  Cr 1.4 on admission, up  from baseline 1.0.  Cr normalized -Monitor Cr on gentamicin  Chronic systolic CHF No evidence of fluid overload -Hold torsemide given hypotension  Chronic hypotension - BP staying in 80-90s. Asymptomatic -Continue midodrine  Atrial flutter, typical HR controlled -Continue Xarelto, digoxin -Holding metoprolol given hypotension  Coronary disease secondary prevention -Continue aspirin and Xarelto and atorvastatin -Hold metoprolol  Diabetes Glucose controlled -Continue SS and mealtime insulin  Crohn's disease   -Continue balsalazide  Anemia of chronic disease Recent baseline appears to be 7-9 g/dL, Stable within range, no clinical bleeding other than scant oozing at wound.  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     Disposition: ID, TOC team & Pharmacy has now confirmed his outpt Abx plans and affordability.  PT recommend SNF.  Due to personal considerations, patient has firmly requested discharge home.  Waiting on family to provide support at home and can be discharge to home with wound vac, HHPT and wound VAC nurse, general surgery follow-up, ID follow-up, and pressure offloading bed.  Patient expressed inability to go today as LLattie Hawisn't ready for him yet. Due to ongoing concerns about him being able to be cared at home  safely,  I had requested Psych c/s for decision making capacity and he's cleared and is competent to make his own decisions. If caregivers and DME's are ready/set up at home - he can be D/Ced Home with [facility]  & PC to follow.   Overall poor prognosis.  Recommend palliative care to follow at home   MDM: The below labs and imaging reports reviewed and summarized above.  Medication management as above.    DVT prophylaxis: N/A on Xarelto Code Status: FULL Family Communication: d/w Sister Colton Lamb by phone y'day on 3/1. She wasn't too happy about him coming home but expresses him being frustrated with his bad experiences at SNF (2 different) and she is trying to arrange private caregivers so as he is cared for at home.  D/w son Colton Lamb on 3/1 -> patient makes his own decision even though family may not agree with same.  They want to respect his wishes.  Patient is adamant in wanting to go home.  Will set up palliative care at discharge as patient and family is in agreement.      Consultants:   Infectious disease  General Surgery  Cardiology   Procedures:   2/23 CT pelvis -- osteomyelitis  2/25 bone biopsy  2/27 echo -- no vegetation  Antimicrobials:   Vancomycin last dose 2/23 at 2200  Ceftriaxone x1 on 2/23  Azithromycin last dose on 2/23   Oral vancomycin 2/25 >> 2/26  Cefepime 2/22 >>   Gentamicin 2/26 >>  Culture data:   2/22 blood culture -  Pseudomonas in 2/2  Urine culture No growth  2/25 bone biopsy -- proteus, reincubated for anaerobes  2/26 repeat blood cultures x2 -- NGTD    Subjective: Doesn't think he can go today - says Colton Lamb is still not ready with caregivers. Still prefers and adamant in wanting to go home only. BP remains low but stable   Objective: Vitals:   09/30/19 1557 10/01/19 0042 10/01/19 0804 10/01/19 0826  BP: (!) 105/58 (!) 90/50 (!) 78/56 95/60  Pulse: 75 85 73 81  Resp: 14 18 16    Temp: 98 F (36.7 C) 97.7 F (36.5 C) 97.7 F (36.5 C)   TempSrc: Oral     SpO2: 95% 100% 98%   Weight:      Height:        Intake/Output Summary (Last 24 hours) at 10/01/2019 2128 Last data filed at 10/01/2019 1611 Gross per 24 hour  Intake 546.46 ml   Output 600 ml  Net -53.54 ml   Filed Weights   09/23/19 1629  Weight: 108.9 kg    Examination: General appearance: Chronically ill-appearing elderly adult male, sleeping but easily arousable and in no acute distress.   HEENT: Anicteric, conjunctiva pink, lids and lashes normal. No nasal deformity, discharge, epistaxis.  Lips moist, mostly edentulous, oropharynx moist, no oral lesions, hearing normal   Skin: Warm and dry.  No suspicious rashes or lesions.  Pale.  Sacral ulcer as per nursing. I didn't examine this today Cardiac: RRR, no murmurs appreciated.  No LE edema.    Respiratory: Normal respiratory rate and rhythm.  CTAB without rales or wheezes. Abdomen: Abdomen soft.  No grimace to palpation, no guarding, no rigidity or rebound. No ascites, distension, hepatosplenomegaly.   MSK: No deformities or effusions of the large joints of the upper or lower extremities bilaterally.  Severe diffuse loss of subcutaneous muscle mass and fat, severe atrophy of the legs bilaterally, and including near complete effacement of the  hamstrings and calves Neuro: Awake. Naming is grossly intact, and the patient's recall, recent and remote, as well as general fund of knowledge seem within normal limits.  Muscle tone massively diminished, without fasciculations.  Moves upper extremities with generalized weakness, weak coordination.  Lower extremity strength is extremely weak.  He has inability to lift the legs off the bed against gravity.  Inability to flex legs at the knee more than a few inches.  Speech fluent.  Psych: Sensorium intact and responding to questions, attention normal. Affect blunted.  Judgment and insight appear normal.         Data Reviewed: I have personally reviewed following labs and imaging studies:  CBC: Recent Labs  Lab 09/27/19 0035 09/28/19 0525 09/29/19 0452 09/30/19 0311 10/01/19 0520  WBC 24.2* 22.8* 19.1* 20.2* 16.2*  HGB 8.0* 8.1* 8.0* 8.2* 8.5*  HCT 26.1* 26.1*  26.6* 26.8* 28.1*  MCV 81.3 82.1 83.1 82.7 84.9  PLT 194 179 160 145* 856*   Basic Metabolic Panel: Recent Labs  Lab 09/25/19 0446 09/25/19 0446 09/26/19 0401 09/26/19 0401 09/27/19 0035 09/28/19 0525 09/29/19 0452 09/30/19 0311 10/01/19 0520  NA 136   < > 139   < > 135 137 138 140 144  K 3.5   < > 3.4*   < > 3.7 3.9 3.3* 3.7 3.7  CL 96*   < > 98   < > 99 103 103 107 109  CO2 29   < > 30   < > 28 26 24 25 27   GLUCOSE 266*   < > 131*   < > 151* 144* 107* 123* 119*  BUN 41*   < > 39*   < > 38* 38* 29* 28* 25*  CREATININE 1.06   < > 0.88   < > 0.88 0.85 0.79   0.74 0.63 0.82  CALCIUM 8.4*   < > 9.0   < > 8.6* 8.6* 8.6* 8.7* 8.8*  MG 2.1  --  2.0  --  1.8  --   --   --   --    < > = values in this interval not displayed.   GFR: Estimated Creatinine Clearance: 108.2 mL/min (by C-G formula based on SCr of 0.82 mg/dL). Liver Function Tests: Recent Labs  Lab 09/27/19 0035  AST 18  ALT 11  ALKPHOS 106  BILITOT 0.6  PROT 5.6*  ALBUMIN 1.8*   No results for input(s): LIPASE, AMYLASE in the last 168 hours. No results for input(s): AMMONIA in the last 168 hours. Coagulation Profile: No results for input(s): INR, PROTIME in the last 168 hours. Cardiac Enzymes: Recent Labs  Lab 10/01/19 0520  CKTOTAL 12*   BNP (last 3 results) No results for input(s): PROBNP in the last 8760 hours. HbA1C: No results for input(s): HGBA1C in the last 72 hours. CBG: Recent Labs  Lab 09/30/19 2114 10/01/19 0754 10/01/19 1200 10/01/19 1632 10/01/19 2105  GLUCAP 158* 106* 158* 144* 169*   Lipid Profile: No results for input(s): CHOL, HDL, LDLCALC, TRIG, CHOLHDL, LDLDIRECT in the last 72 hours. Thyroid Function Tests: No results for input(s): TSH, T4TOTAL, FREET4, T3FREE, THYROIDAB in the last 72 hours. Anemia Panel: No results for input(s): VITAMINB12, FOLATE, FERRITIN, TIBC, IRON, RETICCTPCT in the last 72 hours. Urine analysis:    Component Value Date/Time   COLORURINE AMBER (A)  09/23/2019 1817   APPEARANCEUR TURBID (A) 09/23/2019 1817   LABSPEC 1.013 09/23/2019 1817   PHURINE 5.0 09/23/2019 1817   GLUCOSEU NEGATIVE  09/23/2019 1817   HGBUR NEGATIVE 09/23/2019 1817   BILIRUBINUR NEGATIVE 09/23/2019 1817   KETONESUR NEGATIVE 09/23/2019 1817   PROTEINUR NEGATIVE 09/23/2019 1817   UROBILINOGEN 1.0 11/27/2013 1017   NITRITE NEGATIVE 09/23/2019 1817   LEUKOCYTESUR LARGE (A) 09/23/2019 1817      Scheduled Meds:  ascorbic acid  500 mg Oral BID   aspirin EC  81 mg Oral Daily   [START ON 10/02/2019] atorvastatin  80 mg Oral q1800   balsalazide  750 mg Oral TID   Chlorhexidine Gluconate Cloth  6 each Topical Q0600   digoxin  0.125 mg Oral Daily   finasteride  5 mg Oral Daily   insulin aspart  0-5 Units Subcutaneous QHS   insulin aspart  0-9 Units Subcutaneous TID WC   lactobacillus  1 g Oral TID WC   linezolid  600 mg Oral Q12H   midodrine  10 mg Oral TID   multivitamin-lutein  1 capsule Oral Daily   niacin  1,000 mg Oral QHS   pantoprazole  40 mg Oral Daily   rivaroxaban  20 mg Oral Q lunch   sertraline  25 mg Oral Daily   sodium chloride flush  10-40 mL Intracatheter Q12H   sodium chloride flush  3 mL Intravenous Q12H   tamsulosin  0.4 mg Oral QPC breakfast   Continuous Infusions:  sodium chloride Stopped (09/30/19 1617)   piperacillin-tazobactam (ZOSYN)  IV 3.375 g (10/01/19 1710)     LOS: 8 days    Time spent: 25 minutes    Max Sane, MD [facility] 10/01/2019, 9:28 PM     Please page though [place] or Epic secure chat:  For [employer], Adult nurse

## 2019-10-01 NOTE — Progress Notes (Signed)
Patient suffers from chronic osteomyelitis and stage 4 sacral pressure injury which impairs their ability to perform daily activities like walking, bathing, toileting, and dressing, in the home.  A walker will not resolve issue with performing activities of daily living. A wheelchair will allow patient to safely perform daily activities. Patient is not able to propel themselves in the home using a standard weight wheelchair due to weakness. Patient can self propel in the lightweight wheelchair. Length of need lifetime. Accessories: elevating leg rests (ELRs), wheel locks, extensions and anti-tippers, back cushion, seat cushion.

## 2019-10-01 NOTE — Progress Notes (Signed)
Patients blood sugar is 106 and he is not eating. Spoke with Dr. Manuella Ghazi and asked if he still wanted me to give the additional 5 units? Dr. Manuella Ghazi told me to just hold all insulin.

## 2019-10-01 NOTE — TOC Progression Note (Signed)
Transition of Care [facility]) - Progression Note    Patient Details  Name: Colton Lamb MRN: [id number] Date of Birth: 04-Nov-1950  Transition of Care [facility]) CM/SW Contact  Shelbie Hutching, RN Phone Number: 10/01/2019, 10:14 AM  Clinical Narrative:    Plan for discharge today home with home health services and outpatient palliative.  Home Health will be provided by [facility] for RN, PT, OT, aide and SW.  Patient will have wound vac dressing changes 3 times per week M,W,F.  Patient will also discharge needing IV antibiotics for 2 weeks.  IV antibiotic infusion provided by [facility].  All DME equipment scheduled to be delivered today by [employer], hospital bed, wheelchair, 3 in 1 and lift.  Patient's sister, [name] notified of discharge plan for today, [name] reports that she can be at the patient's home for delivery of the bed.  [name] is also going to look into finding a caregiver to come and stay with the patient.     Expected Discharge Plan: [place] Barriers to Discharge: Continued Medical Work up  Expected Discharge Plan and Services Expected Discharge Plan: [place]   Discharge Planning Services: CM Consult Post Acute Care Choice: [place] arrangements for the past 2 months: [place]: RN, PT, OT, Nurse's Aide, Social Work [facility] Agency: [facility] [place] Date [place]: 09/27/19 Time [place]: 1400 Representative spoke with at [place]: [place] ([place]) Interventions    Readmission Risk Interventions Readmission Risk Prevention Plan 07/18/2019 07/12/2019 06/14/2019  Transportation Screening Complete Complete Complete  Social Work Consult for [place] - - -  Medication Review Press photographer) Complete Complete Complete  PCP or Specialist appointment within 3-5  days of discharge (No Data) - Complete  HRI or [place] - Not Complete -  HRI or Home Care Consult Pt Refusal Comments return to SNF Return to SNF -  SW Recovery Care/Counseling Consult - Complete Complete  Palliative Care Screening - Not Applicable Not Applicable  Skilled Nursing Facility Complete Complete Complete  Some recent data might be hidden

## 2019-10-01 NOTE — Progress Notes (Addendum)
Infectious Disease Long Term IV Antibiotic Orders Colton Lamb 05/14/1951  Diagnosis: Pseudomonal bacteremia, Sacral decubitus ulcer  Culture results Report Status 09/30/2019 FINAL   Organism ID, Bacteria PROTEUS MIRABILIS   Organism ID, Bacteria PSEUDOMONAS AERUGINOSA   Organism ID, Bacteria ENTEROCOCCUS FAECIUM   Resulting Agency [facility]  Susceptibility   Proteus mirabilis Pseudomonas aeruginosa Enterococcus faecium    MIC MIC MIC    AMPICILLIN <=2 SENSITIVE  Sensitive   >=32 RESIST... Resistant    AMPICILLIN/SULBACTAM <=2 SENSITIVE  Sensitive        CEFAZOLIN <=4 SENSITIVE  Sensitive        CEFEPIME <=0.12 SENS... Sensitive 2 SENSITIVE  Sensitive      CEFTAZIDIME <=1 SENSITIVE  Sensitive 4 SENSITIVE  Sensitive      CEFTRIAXONE <=0.25 SENS... Sensitive        CIPROFLOXACIN <=0.25 SENS... Sensitive 0.5 SENSITIVE  Sensitive      GENTAMICIN <=1 SENSITIVE  Sensitive <=1 SENSITIVE  Sensitive      GENTAMICIN SYNERGY     SENSITIVE  Sensitive    IMIPENEM 2 SENSITIVE  Sensitive 2 SENSITIVE  Sensitive      LINEZOLID     2 SENSITIVE  Sensitive    PIP/TAZO <=4 SENSITIVE  Sensitive 16 SENSITIVE  Sensitive      TRIMETH/SULFA <=20 SENSIT... Sensitive        VANCOMYCIN     >=32 RESIST... Resistant             Susceptibility Comments  Proteus mirabilis  MODERATE PROTEUS MIRABILIS  Pseudomonas aeruginosa  RARE PSEUDOMONAS AERUGINOSA  Enterococcus faecium  RARE ENTEROCOCCUS FAECIUM      Culture PSEUDOMONAS AERUGINOSAAbnormal    Report Status 09/27/2019 FINAL   Organism ID, Bacteria PSEUDOMONAS AERUGINOSA   Resulting Agency [facility]  Susceptibility   Pseudomonas aeruginosa    MIC    CEFEPIME 2 SENSITIVE  Sensitive    CEFTAZIDIME 4 SENSITIVE  Sensitive    CIPROFLOXACIN <=0.25 SENS... Sensitive    GENTAMICIN <=1 SENSITIVE  Sensitive    IMIPENEM 1 SENSITIVE  Sensitive    PIP/TAZO 8 SENSITIVE  Sensitive       LABS Lab Results  Component Value Date   CREATININE 0.82  10/01/2019   Lab Results  Component Value Date   WBC 16.2 (H) 10/01/2019   HGB 8.5 (L) 10/01/2019   HCT 28.1 (L) 10/01/2019   MCV 84.9 10/01/2019   PLT 148 (L) 10/01/2019   No results found for: ESRSEDRATE, POCTSEDRATE Lab Results  Component Value Date   CRP 20.5 (H) 09/24/2019    Allergies:  Allergies  Allergen Reactions  . Iodine Other (See Comments)    Shortness of breath, swelling and hives  . Shrimp [Shellfish Allergy] Other (See Comments)    SWELLING    HIVES    SHORTNESS OF BREATH  . Tetracycline Rash    Discharge antibiotics Zosyn  13.5 grams continuous every 24 hours Oral linezolid 600 po q 12 hours x 2 weeks  PICC Care per protocol Labs weekly while on IV antibiotics -FAX weekly labs to [phone] CBC w diff   Comprehensive met panel CRP   Planned duration of antibiotics 4 weeks zosyn, 2 weeks linezolid  Stop date Zosyn March 22,. Linezolid march 15th    Follow up clinic dateTelevisit by March 15ht    P , MD  

## 2019-10-01 NOTE — Progress Notes (Signed)
Occupational Therapy Treatment Patient Details Name: Colton Lamb MRN: [id number] DOB: Aug 08, 1950 Today's Date: 10/01/2019    History of present illness Per MD note: 69 y.o. 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  Also found to be COVID positive. Being treated for pseudomonas bacteremia and possible acute on chronic osteomyelitis. Patient has multiple pressure ulcers present on arrival. Pt well known to therapy and reports he hasn't ambulated in almost 1 year. Has been [facility] care at [facility].   OT comments  Colton Lamb was seen for OT treatment on this date. Upon arrival to room pt awake/alert with PT in room to begin session. Pt agreeable to attempting bed mobility this date. He requires MAX A +2 to come to sitting at EOB. Pt able to sit at EOB for ~5 min and is able to maintain balance with intermittent MAX A +1 to MOD/MAX A +2 with max cueing for hand/foot placement this date. Pt requires at least 1 UE support on bed to maintain upright position in addition to support from therapists this date. Pt fatigues quickly at EOB and requests to return to sidelying in bed. OT/PT assist pt with sit to sidelying t/f and position pt to maximize comfort/safety this date. Pt making progressing toward goals and continues to benefit from skilled OT services to maximize return to PLOF and minimize risk of future falls, injury, caregiver burden, and readmission. Will continue to follow POC as written. Discharge recommendation remains appropriate.    Follow Up Recommendations  SNF    Equipment Recommendations  Other (comment)(Pt is apparently planning to return home per chart review. In which case he'll need w/c, lift, and hospital bed.)    Recommendations for Other Services      Precautions /  Restrictions Precautions Precautions: Fall Precaution Comments: sacral wound Restrictions Weight Bearing Restrictions: No       Mobility Bed Mobility Overal bed mobility: Needs Assistance Bed Mobility: Rolling;Sit to Sidelying;Sidelying to Sit Rolling: Mod assist Sidelying to sit: Max assist;+2 for physical assistance Supine to sit: Mod assist;+2 for physical assistance   Sit to sidelying: Mod assist;+2 for safety/equipment General bed mobility comments: Pt performed sidelying to sit maxAx2 for trunk elevation and LE management.  Transfers                 General transfer comment: unable    Balance Overall balance assessment: Needs assistance Sitting-balance support: Feet unsupported;Bilateral upper extremity supported Sitting balance-Leahy Scale: Zero Sitting balance - Comments: Pt needed maximum support to maintain balance 1-2 people during session despite cueing, repositioning UE and LE support. Able to maintain sitting for several minutes with encouragement.                                   ADL either performed or assessed with clinical judgement   ADL Overall ADL's : Needs assistance/impaired                                       General ADL Comments: Pt continues to require max to total A for ADL management at bed level. +2 max assist for bed mobility. Has not been able to attempt functional mobility  to this date. He is limited by poor activity tolerance, pain, and generalized weakness.     Vision Patient Visual Report: No change from baseline     Perception     Praxis      Cognition Arousal/Alertness: Awake/alert Behavior During Therapy: WFL for tasks assessed/performed Overall Cognitive Status: Within Functional Limits for tasks assessed                                 General Comments: agreeable to work with therapy, oriented, good cue following        Exercises General Exercises - Lower  Extremity Ankle Circles/Pumps: AROM;Strengthening;Both;10 reps Heel Slides: AAROM;Strengthening;Both;10 reps Hip ABduction/ADduction: AAROM;Strengthening;Both;10 reps Straight Leg Raises: AAROM;Strengthening;Both;10 reps Other Exercises Other Exercises: OT/PT engage pt in bed mobility this date. He requires max A +2 to come to sitting EOB with near continuous +2 assist to maintain upright balance at EOB. Pt fatigues quickly and requests to return to bed with max A +2   Shoulder Instructions       General Comments wound vac/colostomy in place at start/end of session.    Pertinent Vitals/ Pain       Pain Assessment: Faces Faces Pain Scale: Hurts even more Pain Location: pt complained of L hip pain Pain Descriptors / Indicators: Grimacing;Moaning Pain Intervention(s): Limited activity within patient's tolerance;Monitored during session;Repositioned  Home Living                                          Prior Functioning/Environment              Frequency  Min 2X/week        Progress Toward Goals  OT Goals(current goals can now be found in the care plan section)  Progress towards OT goals: Progressing toward goals  Acute Rehab OT Goals Patient Stated Goal: to go home OT Goal Formulation: With patient Time For Goal Achievement: 10/14/19 Potential to Achieve Goals: [place] Discharge plan remains appropriate;Frequency remains appropriate    Co-evaluation    PT/OT/SLP Co-Evaluation/Treatment: Yes Reason for Co-Treatment: Complexity of the patient's impairments (multi-system involvement);For patient/therapist safety;To address functional/ADL transfers PT goals addressed during session: Mobility/safety with mobility;Strengthening/ROM OT goals addressed during session: ADL's and self-care      AM-PAC OT "6 Clicks" Daily Activity     Outcome Measure   Help from another person eating meals?: None Help from another person taking care of personal  grooming?: A Little Help from another person toileting, which includes using toliet, bedpan, or urinal?: A Lot Help from another person bathing (including washing, rinsing, drying)?: A Lot Help from another person to put on and taking off regular upper body clothing?: A Little Help from another person to put on and taking off regular lower body clothing?: Total 6 Click Score: 15    End of Session    OT Visit Diagnosis: Muscle weakness (generalized) (M62.81);Pain Pain - Right/Left: Left Pain - part of body: Hip   Activity Tolerance Patient limited by pain   Patient Left in bed;with call bell/phone within reach;with bed alarm set   Nurse Communication Mobility status;Other (comment)(Pt left in sidelying position with ostomy/wound vac in place.)        Time: 1423-1441 OT Time Calculation (min): 18 min  Charges: OT General Charges $OT Visit: 1 Visit OT Treatments $Self  Care/Home Management : 8-22 mins  Shara Blazing, M.S., OTR/L Ascom: [phone] 10/01/19, 4:56 PM

## 2019-10-02 LAB — CULTURE, BLOOD (ROUTINE X 2)
Culture: NO GROWTH
Culture: NO GROWTH
Special Requests: ADEQUATE
Special Requests: ADEQUATE

## 2019-10-02 LAB — GLUCOSE, CAPILLARY
Glucose-Capillary: 100 mg/dL — ABNORMAL HIGH (ref 70–99)
Glucose-Capillary: 135 mg/dL — ABNORMAL HIGH (ref 70–99)
Glucose-Capillary: 139 mg/dL — ABNORMAL HIGH (ref 70–99)

## 2019-10-02 MED ORDER — PIPERACILLIN-TAZOBACTAM IV (FOR PTA / DISCHARGE USE ONLY): 13.5000 g | INTRAVENOUS | 0 refills | Status: DC

## 2019-10-02 MED ORDER — GLUCERNA SHAKE PO LIQD
237.0000 mL | Freq: Three times a day (TID) | ORAL | Status: DC
  Administered 2019-10-02: 237 mL via ORAL

## 2019-10-02 NOTE — TOC Progression Note (Signed)
Transition of Care [facility]) - Progression Note    Patient Details  Name: Colton Lamb MRN: [id number] Date of Birth: Jan 07, 1951  Transition [facility] CM/SW Contact  Shelbie Hutching, RN Phone Number: 10/02/2019, 12:33 PM  Clinical Narrative:    Plan is for patient to discharge home today, Wound vac has been changed today by wound RN.  Patient will be getting IV antibiotics at home and Carolynn Sayers with [facility] is coming to do teaching.  [facility] is not able to accept the patient for home health services at this time but [facility] will accept patient for home health services.  All needed DME is to be delivered to the patient's home today.  Patient's sister [name] is aware of plan for discharge today.  EMS will provide transport.     Expected Discharge Plan: [place] Barriers to Discharge: Continued Medical Work up  Expected Discharge Plan and Services Expected Discharge Plan: [place]   Discharge Planning Services: CM Consult Post Acute Care Choice: [place] arrangements for the past 2 months: [place]                 DME Arranged: 3-N-1, Youth worker wheelchair with seat cushion, Negative pressure wound device, Hospital bed DME Agency: [employer] Date DME Agency Contacted: 10/02/19 Time DME Agency Contacted: 1232 Representative spoke with at DME Agency: [place] Arranged: RN, PT, OT, Nurse's Aide, Social Work [employer] Agency: [place] Date [place]: 10/02/19 Time [place]: 1232 Representative spoke with at [place]: [place] ([place]) Interventions    Readmission Risk Interventions Readmission Risk Prevention Plan 07/18/2019 07/12/2019 06/14/2019  Transportation Screening Complete Complete Complete  Social Work Consult for [place] Designer, fashion/clothing) Complete Complete Complete  PCP or Specialist appointment within 3-5 days of discharge (No Data) - Complete  HRI or [place] - Not Complete -  HRI or Home Care Consult Pt Refusal Comments return to SNF Return to SNF -  SW Recovery Care/Counseling Consult - Complete Complete  Palliative Care Screening - Not Applicable Not Applicable  Skilled Nursing Facility Complete Complete Complete  Some recent data might be hidden

## 2019-10-02 NOTE — Discharge Summary (Signed)
Physician Discharge Summary   CHORD TAKAHASHI  male DOB: July 08, 1951  XBL:[id number]  PCP: [name], MD  Admit date: 09/23/2019 Discharge date: 10/02/2019  Admitted From: SNF Disposition:  Home Pt didn't want to go to SNF but also didn't have option of going to SNF or LTAC due to financial reasons.  TOC and multiple hospitalists worked on setting him up at home with DME, [facility] PT/OT/nursing/SW, wound vac dressing changes, as well as abx infusion.  Pt lives alone.  Pt's sister [name] has offered to go to his house and take care of him as needed, and will learn to do the daily zosyn infusion (24 hour pump), however, this pt is at very high risk of re-admission.    Home Health: Yes CODE STATUS: Full code  Palliative care consulted during hospitalization and following as outpatient  Discharge Instructions    Diet - low sodium heart healthy   Complete by: As directed    Home infusion instructions   Complete by: As directed    Instructions: Flushing of vascular access device: 0.9% NaCl pre/post medication administration and prn patency; Heparin 100 u/ml, 37m for implanted ports and Heparin 10u/ml, 566mfor all other central venous catheters.   Increase activity slowly   Complete by: As directed        Hospital Course:  For full details, please see H&P, progress notes, consult notes and ancillary notes.  Briefly,  Mr. BlErnys a [age].o. 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  Patient had routine labwork at SNF that showed Na 129 and WBC 24K.  In the ER, lactate 2.3 and CXR showed pneumonia and COVID PCR positive.  Pt was started on broad-spectrum antibiotics and admitted.  Sepsis 2/2 Pseudomonas bacteremia  and acute on chronic osteomyelitis Patient sent from facility for leukocytosis.  In ER, found to have tachycardia and leukocytosis, as well as blood pressure less than 100. 2 of 2 blood cultures obtained on presentation growing Pseudomonas.  Bone biopsy obtained, which grew Proteus.  Wound vac placed on 3/1 by surgery and [facility]  Outpt f/up with Dr RodChristian Mate wound clinic.  ID was consulted and recommended IV zosyn for 4 weeks with stop date of 10/21/19, and Linezolid for 2 weeks with stop date of 10/14/19.  Has 4-6 week follow up with Dr. RavDelaine Lameranged.  If able to consistently offload the wound, Gen Surg to make outpatient plastic surgery consultation for skin flap in the future.  Pt reportedly did not want to go to SNF and wanted to go back to his own home, and pt doesn't have option to go to a SNF anyways due to financial reasons.  Pt's sister LisLurline Delfered to look after him at his house for a while and said she would hire someone if it gets too much for her to handle.  Ms. MooLaurance Flattenid she would go to his house after pt is discharged to [facility] how [facility] ref[facility] the daily zosyn infusion pump.  DME equipments have been delivered prior to discharge.  [facility] wound vac change MWF.    COVID-19 asymptomatic Presented with incidental COVID+ PCR.  Possible atelectasis on CXR, no hypoxia or respiratory symptoms.  Did not start COVID treatment.  Keep on isolation until Mar 4.  AKI, resolved Hyponatremia Due to dehydration and sepsis.  Cr 1.4 on admission.  Prior to discharge, Cr 0.82.  Chronic systolic CHF No evidence of fluid overload. Hold torsemide given hypotension  Chronic hypotension  BP staying in 80-90s. Asymptomatic. Continued midodrine.  Atrial flutter, typical HR controlled.  Continued Xarelto, digoxin.  Holding metoprolol given hypotension  Coronary disease secondary prevention Continued aspirin and  atorvastatin  Diabetes Glucose controlled Continued SS and mealtime insulin  Crohn's disease   Continued balsalazide  Anemia of chronic disease Recent baseline appears to be 7-9 g/dL, Stable within range, no clinical bleeding other than scant oozing at wound.  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  Mood disorder Continued sertraline  GERD Continued pantoprazole  BPH Continued Flomax, finasteride  Severe protein calorie malnutrition As evidenced by severe loss of subcutaneous muscle mass, thenar wasting, temporal wasting, buttock ulcer, poor oral intake.  Albumin 1.8.  Pt advised to drink at least 2 Ensures per day in addition to regular meals.   Discharge Diagnoses:  Principal Problem:   Sepsis ([place]) Active Problems:   Hyperlipidemia   CAD S/P percutaneous coronary angioplasty - multiple PCIs   Atrial fibrillation (HCC)   GERD   CROHN'S DISEASE-LARGE & SMALL INTESTINE   Long term current use of anticoagulant   Cardiomyopathy, ischemic   OSA (obstructive sleep apnea)   ICD (implantable cardioverter-defibrillator) in place   Diabetes mellitus type 2, uncontrolled, with complications ([place])   Type II diabetes mellitus ([place])   Hypertension, essential   Pressure injury of skin   Decubitus ulcer of sacral region, unstageable ([place])   Chronic osteomyelitis ([place])   HCAP (healthcare-associated pneumonia)   Colostomy in place [facility])   Goals of care, counseling/discussion   Palliative care by specialist   Acute respiratory distress syndrome (ARDS) due to COVID-19 virus ([place])   Bacteremia due to Pseudomonas   Encounter for competency evaluation    Discharge Instructions:  Allergies as of 10/02/2019      Reactions   Iodine Other (See  Comments)   Shortness of breath, swelling and hives   Shrimp [shellfish Allergy] Other (See Comments)   SWELLING    HIVES    SHORTNESS OF BREATH   Tetracycline Rash      Medication List    STOP taking these medications   HYDROcodone-acetaminophen 5-325 MG tablet Commonly known as: NORCO/VICODIN   metoprolol succinate 25 MG 24 hr tablet Commonly known as: TOPROL-XL   potassium chloride 10 MEQ tablet Commonly known as: KLOR-CON   torsemide 20 MG tablet Commonly known as: DEMADEX     TAKE these medications   acetaminophen 325 MG tablet Commonly known as: TYLENOL Take 1-2 tablets (325-650 mg total) by mouth every 4 (four) hours as needed for mild pain.   Albuterol Sulfate 108 (90 Base) MCG/ACT Aepb Inhale 1 puff into the lungs every 6 (six) hours as needed (shortness of breath).   ascorbic acid 500 MG tablet Commonly known as: VITAMIN C Take 1 tablet (500 mg total) by mouth 2 (two) times daily.   aspirin 81 MG tablet Take 81 mg by mouth daily.   atorvastatin 80 MG tablet Commonly known as: LIPITOR TAKE 1 TABLET BY MOUTH  EVERY DAY   balsalazide 750 MG capsule Commonly known as: COLAZAL TAKE 1 CAPSULE (750 MG TOTAL) BY MOUTH 3 (THREE) TIMES DAILY. What changed:   how much to take  how to take this  when to take this  additional instructions   collagenase ointment Commonly known as: SANTYL Apply 1 application topically daily. (apply to sacrum)   DECUBI-VITE PO Take 1 capsule by mouth daily.   digoxin 0.125 MG tablet Commonly known as: LANOXIN Take 1 tablet (0.125 mg total) by mouth daily.   feeding supplement (NEPRO CARB STEADY) Liqd Take 237 mLs by mouth 2 (two) times daily between meals.   finasteride 5 MG tablet Commonly known as: PROSCAR Take 1 tablet (5 mg total) by mouth daily.   insulin aspart 100 UNIT/ML injection Commonly known as: novoLOG Inject 5 Units into the skin 3 (three) times daily with meals. What changed: when to take this    ipratropium-albuterol 0.5-2.5 (3) MG/3ML Soln Commonly known as: DUONEB Take 3 mLs by nebulization every 4 (four) hours as needed (shortness of breath or wheezing).   lactobacillus Pack Take 1 packet (1 g total) by mouth 3 (three) times daily with meals.   linezolid 600 MG tablet Commonly known as: Zyvox Take 1 tablet (600 mg total) by mouth 2 (two) times daily.   midodrine 10 MG tablet Commonly known as: PROAMATINE Take 1 tablet (10 mg total) by mouth 3 (three) times daily.   niacin 1000 MG CR tablet Commonly known as: NIASPAN Take 1,000 mg by mouth at bedtime.   omeprazole 20 MG capsule Commonly known as: PRILOSEC Take 20 mg by mouth daily.   ondansetron 4 MG tablet Commonly known as: ZOFRAN Take 4 mg by mouth every 8 (eight) hours as needed for nausea or vomiting.   piperacillin-tazobactam  IVPB Commonly known as: ZOSYN Inject 13.5 g into the vein continuous for 20 days. Please infuse 13.5gm piperacillin/tazobactam over 24 hours as continuous infusion Indication: pseudomonas bacteremia and sacral wound Last Day of Therapy: 10/21/2019 Labs - Once weekly:  CBC/D, CMP and CRP   sertraline 25 MG tablet Commonly known as: ZOLOFT Take 25 mg by mouth daily.   tamsulosin 0.4 MG Caps capsule Commonly known as: FLOMAX Take 1 capsule (0.4 mg total) by mouth daily after breakfast.   Xarelto 20 MG Tabs tablet Generic drug: rivaroxaban TAKE 1 TABLET BY MOUTH EVERY DAY WITH LUNCH What changed: See the new instructions.            Home Infusion Instuctions  (From admission, onward)         Start     Ordered   10/02/19 0000  Home infusion instructions    Question:  Instructions  Answer:  Flushing of vascular access device: 0.9% NaCl pre/post medication administration and prn patency; Heparin 100 u/ml, 4m for implanted ports and Heparin 10u/ml, 567mfor all other central venous catheters.   10/02/19 1448           Durable Medical Equipment  (From admission,  onward)         Start     Ordered   10/01/19 0949  For home use only DME lightweight manual wheelchair with seat cushion  Once    Comments: Patient suffers from chronic osteomyelitis and stage 4 sacral pressure injury which impairs their ability to perform daily activities like walking, bathing, toileting, and dressing, in the home.  A walker will not resolve issue with performing activities of daily living. A wheelchair will allow  patient to safely perform daily activities. Patient is not able to propel themselves in the home using a standard weight wheelchair due to weakness. Patient can self propel in the lightweight wheelchair. Length of need lifetime. Accessories: elevating leg rests (ELRs), wheel locks, extensions and anti-tippers, back cushion, seat cushion.   10/01/19 0950   09/30/19 1207  For home use only DME Hospital bed  Once    Question Answer Comment  Length of Need Lifetime   The above medical condition requires: Patient requires the ability to reposition frequently   Head must be elevated greater than: 30 degrees   Bed type Semi-electric   Hoyer Lift Yes   Support Surface: Low Air loss Mattress      09/30/19 1207   09/30/19 0918  For home use only DME Other see comment  Once    Comments: Harrel Lemon lift  Question:  Length of Need  Answer:  Lifetime   09/30/19 0917   09/27/19 1558  For home use only DME Hospital bed  Once    Question Answer Comment  Length of Need 12 Months   Patient has (list medical condition): sacral decubitus ulcer   The above medical condition requires: Patient requires the ability to reposition immediately   Bed type Semi-electric   Support Surface: Alternating Pressure Pad and Pump      09/27/19 1601   09/27/19 1555  For home use only DME 3 n 1  Once     09/27/19 1555   09/27/19 1555  For home use only DME Walker rolling  Once    Question Answer Comment  Walker: With [place]   Patient needs a walker to treat with the following condition  Weakness      09/27/19 1555          Follow-up Information    [place] Follow up on 10/04/2019.   Specialty: Cardiology Why: at 10:30am. Enter through the [place] entrance Contact information: [place] [phone]       [name], MD. Schedule an appointment as soon as possible for a visit in 1 week(s).   Specialty: Family Medicine Contact information: [place] [address] [phone]        Minna Merritts, MD .   Specialty: Cardiology Contact information: [place] [address] [phone]           Allergies  Allergen Reactions   Iodine Other (See Comments)    Shortness of breath, swelling and hives   Shrimp [Shellfish Allergy] Other (See Comments)    SWELLING    HIVES    SHORTNESS OF BREATH   Tetracycline Rash     The results of significant diagnostics from this hospitalization (including imaging, microbiology, ancillary and laboratory) are listed below for reference.   Consultations:   Procedures/Studies: CT PELVIS WO CONTRAST  Result Date: 09/24/2019 CLINICAL DATA:  Infected presacral wound EXAM: CT PELVIS WITHOUT CONTRAST TECHNIQUE: Multidetector CT imaging of the pelvis was performed following the standard protocol without intravenous contrast. Sagittal and coronal MPR images reconstructed from axial data set. COMPARISON:  06/22/2019 FINDINGS: Urinary Tract: Thick walled bladder which could represent chronic cystitis or chronic bladder outlet obstruction. No ureteral dilatation. However, 4 mm calculus is identified within the distal LEFT ureter image 30. Bowel: Prior appendectomy. Double-barrel colostomy LEFT lower quadrant. Question mild rectal wall thickening. Remaining visualized bowel loops unremarkable. Vascular/Lymphatic:  Atherosclerotic calcifications aorta and iliac arteries. No  adenopathy. Reproductive: Mild prostatic enlargement. Unremarkable seminal vesicles. Other:  No free intraperitoneal air or fluid. Musculoskeletal: Large presacral decubitus ulcer with exposed distal sacrum in wound. Bone destruction of distal sacrum and coccyx consistent with osteomyelitis progressive since prior study. Mild chronic sclerosis within S1 segment of sacrum unchanged. IMPRESSION: Large presacral decubitus ulcer with progressive bone destruction of the distal sacrum and coccyx consistent with osteomyelitis. Chronic nonspecific sclerosis within S1 segment of sacrum. Prostatic enlargement with mild bladder wall thickening which could represent muscular hypertrophy from chronic bladder outlet obstruction or from chronic cystitis. Nonobstructing 4 mm calculus distal LEFT ureter. Double-barrel colostomy LEFT lower quadrant. Electronically Signed   By: Lavonia Dana M.D.   On: 09/24/2019 09:38   DG Chest Port 1 View  Result Date: 10/01/2019 CLINICAL DATA:  PICC line placement EXAM: PORTABLE CHEST 1 VIEW COMPARISON:  09/23/2019 FINDINGS: Right PICC line is been placed with the tip in the SVC. Left AICD is unchanged. Cardiomegaly. No confluent opacities or effusions. No acute bony abnormality. IMPRESSION: Right PICC line tip in the SVC. Cardiomegaly. No acute cardiopulmonary disease. Electronically Signed   By: Rolm Baptise M.D.   On: 10/01/2019 12:13   DG Chest Portable 1 View  Result Date: 09/23/2019 CLINICAL DATA:  69 year old male with leukocytosis EXAM: PORTABLE CHEST 1 VIEW COMPARISON:  Chest radiograph dated 07/15/2019. FINDINGS: Faint right lung base density may represent atelectasis. Trace right pleural effusion is not excluded. Clinical correlation is recommended. No pneumothorax. Borderline cardiomegaly. There is improved cardiac size since the prior radiograph. Left pectoral AICD device. No acute osseous pathology. IMPRESSION: 1. Minimal density at the right lung base may represent atelectasis  or trace pleural effusion. Clinical correlation is recommended. 2. Improved cardiac size since the prior radiograph. Electronically Signed   By: Anner Crete M.D.   On: 09/23/2019 19:25   ECHOCARDIOGRAM COMPLETE  Result Date: 09/28/2019    ECHOCARDIOGRAM REPORT   Patient Name:   JOHNATTAN STRASSMAN Date of Exam: 09/28/2019 Medical Rec #:  [id number]        Height:       71.0 in Accession #:    [id number]       Weight:       240.0 lb Date of Birth:  [birth year]        BSA:          2.278 m Patient Age:    69 years         BP:           107/50 mmHg Patient Gender: M                HR:           83 bpm. Exam Location:  [facility] Procedure: 2D Echo Indications:     BACTEREMIA 790.7/R78.81  History:         Patient has prior history of Echocardiogram examinations, most                  recent 06/25/2019. CAD, Signs/Symptoms:Syncope; Risk                  Factors:Dyslipidemia, Diabetes and Hypertension.  Sonographer:     Avanell Shackleton Referring Phys:  [id number] Tsosie Billing Diagnosing Phys: Ida Rogue MD  Sonographer Comments: Technically difficult study due to poor echo windows, suboptimal parasternal window, suboptimal apical window and Technically challenging study due to limited acoustic windows. THERE IS ALOT OF ARTIFACT PRESENT  DUE TO THE INTERFERENCE FROM THE EQUIPMENT IN THE COVID UNIT IMPRESSIONS  1. Left ventricular ejection fraction, by estimation, is 25 to 30%. The left ventricle has severely decreased function. The left ventricle demonstrates regional wall motion abnormalities , anterior , anterospetal and apical wall hypokinesis.  2. Right ventricular systolic function is normal. The right ventricular size is normal. There is mildly elevated pulmonary artery systolic pressure.  3. Left atrial size was mildly dilated.  4. No vegetation noted. FINDINGS  Left Ventricle: Left ventricular ejection fraction, by estimation, is 25 to 30%. The left ventricle has severely decreased function. The left  ventricle demonstrates regional wall motion abnormalities. The left ventricular internal cavity size was normal  in size. There is no left ventricular hypertrophy. Left ventricular diastolic parameters are indeterminate. Right Ventricle: The right ventricular size is normal. No increase in right ventricular wall thickness. Right ventricular systolic function is normal. There is mildly elevated pulmonary artery systolic pressure. The tricuspid regurgitant velocity is 2.66  m/s, and with an assumed right atrial pressure of 10 mmHg, the estimated right ventricular systolic pressure is 54.6 mmHg. Left Atrium: Left atrial size was mildly dilated. Right Atrium: Right atrial size was normal in size. Pericardium: Trivial pericardial effusion is present. Mitral Valve: The mitral valve is normal in structure and function. Normal mobility of the mitral valve leaflets. No evidence of mitral valve regurgitation. No evidence of mitral valve stenosis. Tricuspid Valve: The tricuspid valve is normal in structure. Tricuspid valve regurgitation is not demonstrated. No evidence of tricuspid stenosis. Aortic Valve: The aortic valve is normal in structure and function. Aortic valve regurgitation is not visualized. No aortic stenosis is present. Pulmonic Valve: The pulmonic valve was normal in structure. Pulmonic valve regurgitation is not visualized. No evidence of pulmonic stenosis. Aorta: The aortic root is normal in size and structure. Venous: The inferior vena cava is normal in size with greater than 50% respiratory variability, suggesting right atrial pressure of 3 mmHg. IAS/Shunts: No atrial level shunt detected by color flow Doppler.  LEFT VENTRICLE PLAX 2D LVIDd:         5.07 cm LVIDs:         4.27 cm LV PW:         0.81 cm LV IVS:        1.04 cm  LEFT ATRIUM         Index LA diam:    4.00 cm 1.76 cm/m   AORTA Ao Root diam: 4.70 cm TRICUSPID VALVE TR Peak grad:   28.3 mmHg TR Vmax:        266.00 cm/s Ida Rogue MD  Electronically signed by Ida Rogue MD Signature Date/Time: 09/28/2019/2:38:15 PM    Final    DG HIP UNILAT WITH PELVIS 2-3 VIEWS LEFT  Result Date: 10/01/2019 CLINICAL DATA:  Left hip pain without known injury. EXAM: DG HIP (WITH OR WITHOUT PELVIS) 2-3V LEFT COMPARISON:  None. FINDINGS: There is no evidence of hip fracture or dislocation. There is no evidence of arthropathy or other focal bone abnormality. IMPRESSION: Negative. Electronically Signed   By: Marijo Conception M.D.   On: 10/01/2019 16:27   [place] EKG SITE RITE  Result Date: 09/30/2019 If Site Rite image not attached, placement could not be confirmed due to current cardiac rhythm.     Labs: BNP (last 3 results) Recent Labs    04/30/19 2137 05/18/19 1536 06/12/19 0100  BNP 879.0* 721.0* 270.3*   Basic Metabolic Panel: Recent Labs  Lab 09/26/19  0401 09/26/19 0401 09/27/19 0035 09/28/19 0525 09/29/19 0452 09/30/19 0311 10/01/19 0520  NA 139   < > 135 137 138 140 144  K 3.4*   < > 3.7 3.9 3.3* 3.7 3.7  CL 98   < > 99 103 103 107 109  CO2 30   < > 28 26 24 25 27   GLUCOSE 131*   < > 151* 144* 107* 123* 119*  BUN 39*   < > 38* 38* 29* 28* 25*  CREATININE 0.88   < > 0.88 0.85 0.79   0.74 0.63 0.82  CALCIUM 9.0   < > 8.6* 8.6* 8.6* 8.7* 8.8*  MG 2.0  --  1.8  --   --   --   --    < > = values in this interval not displayed.   Liver Function Tests: Recent Labs  Lab 09/27/19 0035  AST 18  ALT 11  ALKPHOS 106  BILITOT 0.6  PROT 5.6*  ALBUMIN 1.8*   No results for input(s): LIPASE, AMYLASE in the last 168 hours. No results for input(s): AMMONIA in the last 168 hours. CBC: Recent Labs  Lab 09/27/19 0035 09/28/19 0525 09/29/19 0452 09/30/19 0311 10/01/19 0520  WBC 24.2* 22.8* 19.1* 20.2* 16.2*  HGB 8.0* 8.1* 8.0* 8.2* 8.5*  HCT 26.1* 26.1* 26.6* 26.8* 28.1*  MCV 81.3 82.1 83.1 82.7 84.9  PLT 194 179 160 145* 148*   Cardiac Enzymes: Recent Labs  Lab 10/01/19 0520  CKTOTAL 12*   BNP: Invalid  input(s): POCBNP CBG: Recent Labs  Lab 10/01/19 1200 10/01/19 1632 10/01/19 2105 10/02/19 0728 10/02/19 1120  GLUCAP 158* 144* 169* 100* 135*   D-Dimer No results for input(s): DDIMER in the last 72 hours. Hgb A1c No results for input(s): HGBA1C in the last 72 hours. Lipid Profile No results for input(s): CHOL, HDL, LDLCALC, TRIG, CHOLHDL, LDLDIRECT in the last 72 hours. Thyroid function studies No results for input(s): TSH, T4TOTAL, T3FREE, THYROIDAB in the last 72 hours.  Invalid input(s): FREET3 Anemia work up No results for input(s): VITAMINB12, FOLATE, FERRITIN, TIBC, IRON, RETICCTPCT in the last 72 hours. Urinalysis    Component Value Date/Time   COLORURINE AMBER (A) 09/23/2019 1817   APPEARANCEUR TURBID (A) 09/23/2019 1817   LABSPEC 1.013 09/23/2019 1817   PHURINE 5.0 09/23/2019 1817   GLUCOSEU NEGATIVE 09/23/2019 1817   HGBUR NEGATIVE 09/23/2019 1817   BILIRUBINUR NEGATIVE 09/23/2019 1817   KETONESUR NEGATIVE 09/23/2019 1817   PROTEINUR NEGATIVE 09/23/2019 1817   UROBILINOGEN 1.0 11/27/2013 1017   NITRITE NEGATIVE 09/23/2019 1817   LEUKOCYTESUR LARGE (A) 09/23/2019 1817   Sepsis Labs Invalid input(s): PROCALCITONIN,  WBC,  LACTICIDVEN Microbiology Recent Results (from the past 240 hour(s))  Culture, blood (routine x 2)     Status: Abnormal   Collection Time: 09/23/19  8:33 PM   Specimen: BLOOD  Result Value Ref Range Status   Specimen Description BLOOD RIGHT HAND  Final   Special Requests   Final    BOTTLES DRAWN AEROBIC AND ANAEROBIC Blood Culture results may not be optimal due to an inadequate volume of blood received in culture bottles   Culture  Setup Time   Final    Organism ID to follow ANAEROBIC BOTTLE ONLY GRAM NEGATIVE RODS CRITICAL RESULT CALLED TO, READ BACK BY AND VERIFIED WITH: DAVID BESANTI AT 0600 ON 09/26/2019 [place]. Performed at [facility], [address], [place], [place] [address]    Culture (A)  Final  PSEUDOMONAS  AERUGINOSA SUSCEPTIBILITIES PERFORMED ON PREVIOUS CULTURE WITHIN THE LAST 5 DAYS.    Report Status 09/28/2019 FINAL  Final  Culture, blood (routine x 2)     Status: Abnormal   Collection Time: 09/23/19  8:33 PM   Specimen: BLOOD  Result Value Ref Range Status   Specimen Description   Final    BLOOD RIGHT FOREARM Performed at [facility], [address], [place], [place] [address]    Special Requests   Final    BOTTLES DRAWN AEROBIC AND ANAEROBIC Blood Culture results may not be optimal due to an inadequate volume of blood received in culture bottles Performed at [facility], [address] [place], [place] [address]    Culture  Setup Time   Final    GRAM NEGATIVE RODS ANAEROBIC BOTTLE ONLY CRITICAL VALUE NOTED.  VALUE IS CONSISTENT WITH PREVIOUSLY REPORTED AND CALLED VALUE. Performed at [facility], [place] [address] [place], [place] [address]    Culture PSEUDOMONAS AERUGINOSA (A)  Final   Report Status 09/27/2019 FINAL  Final   Organism ID, Bacteria PSEUDOMONAS AERUGINOSA  Final      Susceptibility   Pseudomonas aeruginosa - MIC*    CEFTAZIDIME 4 SENSITIVE Sensitive     CIPROFLOXACIN <=0.25 SENSITIVE Sensitive     GENTAMICIN <=1 SENSITIVE Sensitive     IMIPENEM 1 SENSITIVE Sensitive     PIP/TAZO 8 SENSITIVE Sensitive     CEFEPIME 2 SENSITIVE Sensitive     * PSEUDOMONAS AERUGINOSA  Blood Culture ID Panel (Reflexed)     Status: Abnormal   Collection Time: 09/23/19  8:33 PM  Result Value Ref Range Status   Enterococcus species NOT DETECTED NOT DETECTED Final   Listeria monocytogenes NOT DETECTED NOT DETECTED Final   Staphylococcus species NOT DETECTED NOT DETECTED Final   Staphylococcus aureus (BCID) NOT DETECTED NOT DETECTED Final   Streptococcus species NOT DETECTED NOT DETECTED Final   Streptococcus agalactiae NOT DETECTED NOT DETECTED Final   Streptococcus pneumoniae NOT DETECTED NOT DETECTED Final   Streptococcus pyogenes NOT DETECTED NOT  DETECTED Final   Acinetobacter baumannii NOT DETECTED NOT DETECTED Final   Enterobacteriaceae species NOT DETECTED NOT DETECTED Final   Enterobacter cloacae complex NOT DETECTED NOT DETECTED Final   Escherichia coli NOT DETECTED NOT DETECTED Final   Klebsiella oxytoca NOT DETECTED NOT DETECTED Final   Klebsiella pneumoniae NOT DETECTED NOT DETECTED Final   Proteus species NOT DETECTED NOT DETECTED Final   Serratia marcescens NOT DETECTED NOT DETECTED Final   Carbapenem resistance NOT DETECTED NOT DETECTED Final   Haemophilus influenzae NOT DETECTED NOT DETECTED Final   Neisseria meningitidis NOT DETECTED NOT DETECTED Final   Pseudomonas aeruginosa DETECTED (A) NOT DETECTED Final    Comment: CRITICAL RESULT CALLED TO, READ BACK BY AND VERIFIED WITH: DAVID BESANTI AT 0600 ON 09/26/2019 [place].    Candida albicans NOT DETECTED NOT DETECTED Final   Candida glabrata NOT DETECTED NOT DETECTED Final   Candida krusei NOT DETECTED NOT DETECTED Final   Candida parapsilosis NOT DETECTED NOT DETECTED Final   Candida tropicalis NOT DETECTED NOT DETECTED Final    Comment: Performed at [facility], [address] [place], [place] [address]  SARS CORONAVIRUS 2 (TAT 6-24 HRS) Nasopharyngeal Nasopharyngeal Swab     Status: Abnormal   Collection Time: 09/23/19  9:06 PM   Specimen: Nasopharyngeal Swab  Result Value Ref Range Status   SARS Coronavirus 2 POSITIVE (A) NEGATIVE Final    Comment:  RESULT CALLED TO, READ BACK BY AND VERIFIED WITH: Chauncy Passy, RN [facility]) AT [phone] ON 09/24/19 BY C. JESSUP, MT. (NOTE) SARS-CoV-2 target nucleic acids are DETECTED. The SARS-CoV-2 RNA is generally detectable in upper and lower respiratory specimens during the acute phase of infection. Positive results are indicative of the presence of SARS-CoV-2 RNA. Clinical correlation with patient history and other diagnostic information is  necessary to determine patient infection status. Positive results do not rule  out bacterial infection or co-infection with other viruses.  The expected result is Negative. Fact Sheet for Patients: SugarRoll.be Fact Sheet for Healthcare Providers: https://www.woods-mathews.com/ This test is not yet approved or cleared by the [place] FDA and  has been authorized for detection and/or diagnosis of SARS-CoV-2 by FDA under an Emergency Use Authorization (EUA). This EUA will remain  in effect (meaning this  test can be used) for the duration of the COVID-19 declaration under Section 564(b)(1) of the Act, 21 U.S.C. section 360bbb-3(b)(1), unless the authorization is terminated or revoked sooner. Performed at [facility], [place] [address] [place], [place] [address]   Urine Culture     Status: None   Collection Time: 09/25/19  7:45 AM   Specimen: Urine, Random  Result Value Ref Range Status   Specimen Description   Final    URINE, RANDOM Performed at [facility], [address], [place], [place] [address]    Special Requests   Final    NONE Performed at [facility], [address], [place], [place] [address]    Culture   Final    NO GROWTH Performed at [facility], [place] [address], [place], [place] [address]    Report Status 09/26/2019 FINAL  Final  MRSA PCR Screening     Status: None   Collection Time: 09/25/19  8:36 AM   Specimen: Nasopharyngeal  Result Value Ref Range Status   MRSA by PCR NEGATIVE NEGATIVE Final    Comment:        The GeneXpert MRSA Assay (FDA approved for NASAL specimens only), is one component of a comprehensive MRSA colonization surveillance program. It is not intended to diagnose MRSA infection nor to guide or monitor treatment for MRSA infections. Performed at [facility], [address] [place], [place] [address]   C difficile quick scan w PCR reflex     Status: Abnormal   Collection Time: 09/25/19  3:56 PM   Specimen: STOOL  Result  Value Ref Range Status   C Diff antigen POSITIVE (A) NEGATIVE Final   C Diff toxin NEGATIVE NEGATIVE Final   C Diff interpretation Results are indeterminate. See PCR results.  Final    Comment: Performed at [facility], [address] [place], [place] [address]  C. Diff by PCR, Reflexed     Status: Abnormal   Collection Time: 09/25/19  3:56 PM  Result Value Ref Range Status   Toxigenic C. Difficile by PCR POSITIVE (A) NEGATIVE Final    Comment: Positive for toxigenic C. difficile with little to no toxin production. Only treat if clinical presentation suggests symptomatic illness. Performed at [facility], [address] [place], [place] [address]   Aerobic/Anaerobic Culture (surgical/deep wound)     Status: None   Collection Time: 09/26/19 12:07 PM   Specimen: Bone; Wound  Result Value Ref Range Status   Specimen Description   Final    BONE Performed at [facility], [address], [place], [place] [address]  Special Requests   Final    NONE Performed at [facility], [address] [place], [place] [address]    Gram Stain   Final    FEW WBC PRESENT, PREDOMINANTLY PMN FEW GRAM POSITIVE COCCI FEW GRAM NEGATIVE RODS RARE GRAM POSITIVE RODS    Culture   Final    MODERATE PROTEUS MIRABILIS RARE PSEUDOMONAS AERUGINOSA RARE ENTEROCOCCUS FAECIUM MODERATE BACTEROIDES THETAIOTAOMICRON BETA LACTAMASE POSITIVE Performed at [facility], [place] [address], [place], [place] [address]    Report Status 09/30/2019 FINAL  Final   Organism ID, Bacteria PROTEUS MIRABILIS  Final   Organism ID, Bacteria PSEUDOMONAS AERUGINOSA  Final   Organism ID, Bacteria ENTEROCOCCUS FAECIUM  Final      Susceptibility   Enterococcus faecium - MIC*    AMPICILLIN >=32 RESISTANT Resistant     VANCOMYCIN >=32 RESISTANT Resistant     GENTAMICIN SYNERGY SENSITIVE Sensitive     LINEZOLID 2 SENSITIVE Sensitive     * RARE ENTEROCOCCUS FAECIUM   Pseudomonas  aeruginosa - MIC*    CEFTAZIDIME 4 SENSITIVE Sensitive     CIPROFLOXACIN 0.5 SENSITIVE Sensitive     GENTAMICIN <=1 SENSITIVE Sensitive     IMIPENEM 2 SENSITIVE Sensitive     PIP/TAZO 16 SENSITIVE Sensitive     CEFEPIME 2 SENSITIVE Sensitive     * RARE PSEUDOMONAS AERUGINOSA   Proteus mirabilis - MIC*    AMPICILLIN <=2 SENSITIVE Sensitive     CEFAZOLIN <=4 SENSITIVE Sensitive     CEFEPIME <=0.12 SENSITIVE Sensitive     CEFTAZIDIME <=1 SENSITIVE Sensitive     CEFTRIAXONE <=0.25 SENSITIVE Sensitive     CIPROFLOXACIN <=0.25 SENSITIVE Sensitive     GENTAMICIN <=1 SENSITIVE Sensitive     IMIPENEM 2 SENSITIVE Sensitive     TRIMETH/SULFA <=20 SENSITIVE Sensitive     AMPICILLIN/SULBACTAM <=2 SENSITIVE Sensitive     PIP/TAZO <=4 SENSITIVE Sensitive     * MODERATE PROTEUS MIRABILIS  Culture, blood (Routine X 2) w Reflex to ID Panel     Status: None   Collection Time: 09/27/19 12:34 AM   Specimen: Right Antecubital; Blood  Result Value Ref Range Status   Specimen Description RIGHT ANTECUBITAL  Final   Special Requests Blood Culture adequate volume  Final   Culture   Final    NO GROWTH 5 DAYS Performed at [facility], [address] [place], [place] [address]    Report Status 10/02/2019 FINAL  Final  Culture, blood (Routine X 2) w Reflex to ID Panel     Status: None   Collection Time: 09/27/19 12:40 AM   Specimen: BLOOD RIGHT HAND  Result Value Ref Range Status   Specimen Description BLOOD RIGHT HAND  Final   Special Requests Blood Culture adequate volume  Final   Culture   Final    NO GROWTH 5 DAYS Performed at [facility], [address], [place],  [address]    Report Status 10/02/2019 FINAL  Final     Total time spend on discharging this patient, including the last patient exam, discussing the hospital stay, instructions for ongoing care as it relates to all pertinent caregivers, as well as preparing the medical discharge records, prescriptions,  and/or referrals as applicable, is 096 minutes.    Enzo Bi, MD  [facility] 10/02/2019, 2:49 PM  If 7PM-7AM, please contact night-coverage

## 2019-10-02 NOTE — Progress Notes (Addendum)
   10/02/19 0857  Vitals  Temp (!) 97.1 F (36.2 C)  Temp Source Axillary  BP (!) 87/58  MAP (mmHg) 68  BP Location Left Arm  BP Method Automatic  Pulse Rate 84  Pulse Rate Source Monitor  Resp 17  Oxygen Therapy  SpO2 100 %  O2 Device Room Air  Pain Assessment  Pain Scale 0-10  Pain Score 0  MEWS Score  MEWS Temp 0  MEWS Systolic 1  MEWS Pulse 0  MEWS RR 0  MEWS LOC 0  MEWS Score 1  MEWS Score Color Green  Dr. Billie Ruddy aware of current BP.  Patient due for dose of midodrine.  No new orders at this time.  Clarise Cruz, BSN

## 2019-10-02 NOTE — Consult Note (Addendum)
[place] Nurse Consult Note: Reason for consult: Surgical team following for assessment and plan of care for sacrum wound.  They applied Vac dressing on 3/1.  Requested to change dressing today and Q M/W/F while in the hospital.  Pt is in isolation for Covid.  Wound type: Pt is familiar to [facility] team from previous admissions for chronic stage 4 sacrum pressure injury Pressure Injury POA: Yes Measurement: 8X7X3.5cm, butterfly shaped, located in close proximity to the rectum.  It will be difficult to maintain a seal if patient is incontinent of stool.  Wound bed: 90% red, 10% slough, bone palpable Drainage (amount, consistency, odor) mod amt pink drainage, no odor Periwound: intact skin surrounding Dressing procedure/placement/frequency: Applied 2 pieces black foam to 116m cont suction. Barrier ring applied to wound edges to attempt to maintain a seal.  Pt denied need for pain meds and tolerated with minimal amt discomfort.  Bridged track pad to flank to reduce pressure.  Placed on patient's Medella negative pressure device in room to prepare for impending discharge home.  [facility] will change dressing on Friday if patient is still in the hospital at that time.  Thank-you,  DJulien GirtMSN, [place]

## 2019-10-02 NOTE — Progress Notes (Signed)
Patient transported home via EMS with wound vac and IV pump in place.  Discussed discharge instructions and medications with both patient and his sister [name].  All questions addressed.  Clarise Cruz, BSN

## 2019-10-02 NOTE — TOC Transition Note (Signed)
Transition of Care Colton Lamb) - CM/SW Discharge Note   Patient Details  Name: Colton Lamb MRN: [id number] Date of Birth: [birth year]  Transition of Care Colton Lamb) CM/SW Contact:  Colton Hutching, RN Phone Number: 10/02/2019, 3:19 PM   Clinical Narrative:    Patient will discharge home with home health services provided by Colton Lamb.  HH RN is scheduled to see patient tomorrow between 1 and 2 pm with the patient's sister there to do teaching for the antibiotics.  IV antibiotics only need to be changed out once daily.  Wound vac will need to be changed 3 times per week.  Equipment is being delivered now.  This RNCM will set up Lamb transport to take patient home.  Patient voices his desire to go home.   This RNCM encouraged the sister to call if she has any questions even after discharge, Colton Lamb has this RNCM's work phone number.    Final next level of care: Colton Lamb Barriers to Discharge: Barriers Resolved   Patient Goals and CMS Choice Patient states their goals for this hospitalization and ongoing recovery are:: Patient would like to go home CMS Medicare.gov Compare Post Acute Care list provided to:: Patient Choice offered to / list presented to : Patient  Discharge Placement                Patient to be transferred to facility by: Transport home by Colton Lamb Name of family member notified: Colton Lamb- sister Patient and family notified of of transfer: 10/02/19  Discharge Plan and Services   Discharge Planning Services: CM Consult Post Acute Care Choice: Home Health          DME Arranged: 3-N-1, Lightweight manual wheelchair with seat cushion, Negative pressure wound device, Hospital bed DME Agency: Colton Lamb Date DME Agency Contacted: 10/02/19 Time DME Agency Contacted: 1232 Representative spoke with at DME Agency: Colton Lamb Arranged: RN, PT, OT, Nurse's Aide, Social Work Colton Lamb Date [place]: 10/02/19 Time [place]: 1232 Representative spoke with at [place]: Colton ([place]) Interventions     Readmission Risk Interventions Readmission Risk Prevention Plan 07/18/2019 07/12/2019 06/14/2019  Transportation Screening Complete Complete Complete  Social Work Consult for [employer]) Complete Complete Complete  PCP or Specialist appointment within 3-5 days of discharge (No Data) - Complete  HRI or [place] - Not Complete -  HRI or Home Care Consult Pt Refusal Comments return to SNF Return to SNF -  SW Recovery Care/Counseling Consult - Complete Complete  Palliative Care Screening - Not Applicable Not Applicable  Skilled Nursing Facility Complete Complete Complete  Some recent data might be hidden

## 2019-10-03 ENCOUNTER — Other Ambulatory Visit: Payer: Self-pay | Admitting: Cardiovascular Disease

## 2019-10-03 NOTE — Telephone Encounter (Signed)
Please review for refill on Niacin. The medication was placed under historical provider.  Who is the prescribing physician?

## 2019-10-03 NOTE — Telephone Encounter (Signed)
Please advise if OK to refill. Listed under Historical provider. Not mentioned in last two office notes with Dr. Rockey Situ or Thurmond Butts. Thank you!

## 2019-10-04 ENCOUNTER — Telehealth: Payer: Self-pay | Admitting: Primary Care

## 2019-10-04 ENCOUNTER — Ambulatory Visit: Payer: Medicare Other | Admitting: Family

## 2019-10-04 ENCOUNTER — Telehealth: Payer: Self-pay | Admitting: Family

## 2019-10-04 NOTE — Telephone Encounter (Signed)
Tried to reach patient again to schedule Palliative Consult and same as before, it went straight to voicemail and the mailbox is full.  I will f/u next week with patient again.

## 2019-10-04 NOTE — Telephone Encounter (Signed)
Called patient to schedule Palliative Consult, it went straight to voicemail and I was unable to leave message due to VM box was full.  Will f/u later.

## 2019-10-04 NOTE — Telephone Encounter (Signed)
Patient did not show for his [facility] appointment on 10/04/19. Will attempt to reschedule.

## 2019-10-07 ENCOUNTER — Telehealth: Payer: Self-pay | Admitting: Primary Care

## 2019-10-07 NOTE — Telephone Encounter (Signed)
Called patient to schedule Palliative Consult, no answer - left message with reason for call along with my name and contact number. 

## 2019-10-09 ENCOUNTER — Other Ambulatory Visit: Payer: Self-pay

## 2019-10-09 ENCOUNTER — Encounter: Payer: Self-pay | Admitting: Physician Assistant

## 2019-10-09 ENCOUNTER — Emergency Department
Admission: EM | Admit: 2019-10-09 | Discharge: 2019-10-09 | Disposition: A | Discharge status: Home / Self Care | Payer: Medicare Other | Source: Home / Self Care | Attending: Emergency Medicine | Admitting: Emergency Medicine

## 2019-10-09 DIAGNOSIS — I110 Hypertensive heart disease with heart failure: Secondary | ICD-10-CM | POA: Insufficient documentation

## 2019-10-09 DIAGNOSIS — Z794 Long term (current) use of insulin: Secondary | ICD-10-CM | POA: Insufficient documentation

## 2019-10-09 DIAGNOSIS — Z7982 Long term (current) use of aspirin: Secondary | ICD-10-CM | POA: Insufficient documentation

## 2019-10-09 DIAGNOSIS — Z433 Encounter for attention to colostomy: Secondary | ICD-10-CM | POA: Insufficient documentation

## 2019-10-09 DIAGNOSIS — Z95810 Presence of automatic (implantable) cardiac defibrillator: Secondary | ICD-10-CM | POA: Insufficient documentation

## 2019-10-09 DIAGNOSIS — I2510 Atherosclerotic heart disease of native coronary artery without angina pectoris: Secondary | ICD-10-CM | POA: Insufficient documentation

## 2019-10-09 DIAGNOSIS — Z79899 Other long term (current) drug therapy: Secondary | ICD-10-CM | POA: Insufficient documentation

## 2019-10-09 DIAGNOSIS — N179 Acute kidney failure, unspecified: Secondary | ICD-10-CM | POA: Diagnosis not present

## 2019-10-09 DIAGNOSIS — I509 Heart failure, unspecified: Secondary | ICD-10-CM | POA: Insufficient documentation

## 2019-10-09 DIAGNOSIS — E119 Type 2 diabetes mellitus without complications: Secondary | ICD-10-CM | POA: Insufficient documentation

## 2019-10-09 NOTE — ED Triage Notes (Addendum)
Pt brought in via ems from home for colostomy bag change.  Bag is leaking.  Home health did not have any bags so pt was sent to er for bag change.  Pt is covid positive  Pt alert.

## 2019-10-09 NOTE — ED Notes (Signed)
Colostomy bag leaking.  New bag applied.  Pt alert.

## 2019-10-09 NOTE — ED Notes (Signed)
Colostomy bag changed without difficulty.  D.c inst to pt

## 2019-10-10 NOTE — ED Provider Notes (Signed)
[facility] Emergency Department Provider Note ____________________________________________  Time seen: 2115  I have reviewed the triage vital signs and the nursing notes.  HISTORY  Chief Complaint  colostomy bag change  HPI Colton Lamb is a 69 y.o. male with active Covid, presents to the ED via EMS from home.   Patient is under the care of a home health aide, but apparently during the back change today.  The wafer became loosened and the patient has had leakage of stool.  The home health aide apparently did not have any supplies to leave the patient to the patient presents now for management of his colostomy bag.  He is without any additional complaints at this time.  Past Medical History:  Diagnosis Date  . Atrial flutter ([place])    a. s/p Cardioversion 11/22/13, on amiodarone and Xarelto.  . CHF (congestive heart failure) ([place])   . Chronic osteomyelitis ([place]) 06/30/2019   s/p colostomy  . Chronic systolic heart failure ([place])    a. 10/2013 EF 20-25%, grade III DD, RV mildly dilated and sys fx mild/mod reduced;  b. 01/2014 Echo: EF 30-35%, gr3 DD, mod dil LA. c. 07/2019 EF 20-25%  . Coronary artery disease    a. s/p MI 2007/2015;  b. s/p prior PCI to the LAD/LCX/PDA/PL;  c. 2008: s/p Cypher DES to the OM.  Marland Kitchen Crohn's ileocolitis ([place])   . GERD (gastroesophageal reflux disease)   . Hx of adenomatous colonic polyps 11/2003  . Hyperlipidemia   . Hypertension   . Hypotension   . Ischemic cardiomyopathy    s. 01/2014 s/p MDT [id number] [name] XT DR single lead AICD.  Marland Kitchen Obesity   . Paroxysmal atrial fibrillation (HCC)    a. CHA2DS2VASc = (CHF, HTN, agex1, DM)  . Sleep apnea   . Syncope    a.  11/2013 in setting of volume depletion and bradycardia due to dig toxicity   . Type II diabetes mellitus [facility])     Patient Active Problem List   Diagnosis Date Noted  . Encounter for competency evaluation   . Acute respiratory distress syndrome (ARDS) due to COVID-19 virus  ([place]) 09/29/2019  . Bacteremia due to Pseudomonas 09/29/2019  . Goals of care, counseling/discussion   . Palliative care by specialist   . HCAP (healthcare-associated pneumonia) 09/23/2019  . 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 Bleeding 07/11/2019  . Chronic osteomyelitis ([place]) 06/30/2019  . PICC (peripherally inserted central catheter) flush   . Normocytic anemia 06/21/2019  . Decubitus ulcer of sacral region, unstageable ([place]) 06/19/2019  . Hypotension 06/19/2019  . Anemia   . Acute respiratory failure ([place]) 06/12/2019  . Acute on chronic congestive heart failure ([place])   . Acute blood loss anemia   . Pressure injury of skin 03/17/2019  . Acute renal failure (ARF) ([place])   . Empyema lung ([place])   . Shortness of breath   . Pleural effusion on right   . Sepsis ([place]) 03/04/2019  . Restless legs syndrome (RLS) 06/16/2017  . Hypertension, essential 06/16/2017  . CAD in native artery 04/27/2017  . Hydrocephalus ([place]) 09/08/2016  . Dizziness and giddiness   . Elevated troponin 09/06/2016  . Type II diabetes mellitus ([place])   . Ischemic cardiomyopathy   . Hyponatremia 07/01/2015  . Diabetes mellitus type 2, uncontrolled, with complications ([place]) 77/41/2878  . 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 OSA (obstructive sleep apnea) 12/20/2013  . Morbid obesity ([place]) 12/20/2013  . AKI (acute kidney injury) -  Creatinine improved at d/c 12/14/2013  . Elevated TSH - will need f/u TFTs with PCP in 3-4 weeks 12/14/2013  . Junctional bradycardia - resolved 12/14/2013  . Syncope - due to bradycardia in setting of Digoxin Toxicity 12/11/2013  . Acute on chronic HFrEF (heart failure with reduced ejection fraction) ([place]) 12/06/2013  . At risk for sudden cardiac death - on LifeVest 12-19-2013  . Acute on chronic combined systolic and diastolic CHF (congestive heart failure) ([place]) 2013-12-19  . Cardiomyopathy, ischemic 12-19-2013  . Atrial flutter ([place]) 11/22/2013  .  NSTEMI (non-ST elevated myocardial infarction) ([place]) 11/22/2013  . Long term current use of anticoagulant 10/19/2010  . Hyperlipidemia 05/06/2010  . Edema 05/06/2010  . CAD S/P percutaneous coronary angioplasty - multiple PCIs 11/11/2008  . Atrial fibrillation ([place]) 11/11/2008  . GERD 11/11/2008  . [place] INTESTINE 11/11/2008  . COLONIC POLYPS, HX OF 11/11/2008  . [place] ALLERGY 11/11/2008    Past Surgical History:  Procedure Laterality Date  . ATRIAL FLUTTER ABLATION N/A 04/16/2014   Procedure: ATRIAL FLUTTER ABLATION;  Surgeon: Evans Lance, MD;  Location: [facility] CATH LAB;  Service: Cardiovascular;  Laterality: N/A;  . CARDIAC CATHETERIZATION  10/2013  . CARDIAC DEFIBRILLATOR PLACEMENT  04/16/2014   Medtronic Evira device  . CARDIAC ELECTROPHYSIOLOGY STUDY AND ABLATION  04/16/2014   atrial flutter ablation  . CARDIOVERSION N/A 03/05/2014   Procedure: CARDIOVERSION;  Surgeon: Jolaine Artist, MD;  Location: [facility] ENDOSCOPY;  Service: Cardiovascular;  Laterality: N/A;  . CATARACT EXTRACTION W/PHACO Right 01/04/2017   Procedure: CATARACT EXTRACTION PHACO AND INTRAOCULAR LENS PLACEMENT ([place])  Right Diabetic Complicated;  Surgeon: Leandrew Koyanagi, MD;  Location: [place];  Service: Ophthalmology;  Laterality: Right;  Diabetic  . CATARACT EXTRACTION W/PHACO Left 02/08/2017   Procedure: CATARACT EXTRACTION PHACO AND INTRAOCULAR LENS PLACEMENT ([place]) left diabetic;  Surgeon: Leandrew Koyanagi, MD;  Location: [place];  Service: Ophthalmology;  Laterality: Left;  Diabetic - oral meds sleep apnea  . CORONARY ANGIOPLASTY WITH STENT PLACEMENT  2007; 2008 X 2   "1+1 ~ 1"  . FOOT SURGERY Left    bone spur  . HYDROCELE EXCISION Bilateral   . 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 IMPLANTABLE CARDIOVERTER DEFIBRILLATOR IMPLANT N/A 04/16/2014   Procedure: IMPLANTABLE CARDIOVERTER DEFIBRILLATOR IMPLANT;  Surgeon: Evans Lance, MD;  [facility] C[facility] LAB;  Service: Cardiovascular;  Laterality: N/A;  . LEFT HEART CATHETERIZATION WITH CORONARY ANGIOGRAM N/A 11/22/2013   Procedure: LEFT HEART CATHETERIZATION WITH CORONARY ANGIOGRAM;  Surgeon: Sinclair Grooms, MD;  Location: [facility] CATH LAB;  Service: Cardiovascular;  Laterality: N/A;  . TRANSVERSE LOOP COLOSTOMY N/A 07/03/2019   Procedure: TRANSVERSE LOOP COLOSTOMY;  Surgeon: Ronny Bacon, MD;  Location: [facility] ORS;  Service: General;  Laterality: N/A;    Prior to Admission medications   Medication Sig Start Date End Date Taking? Authorizing Provider  acetaminophen (TYLENOL) 325 MG tablet Take 1-2 tablets (325-650 mg total) by mouth every 4 (four) hours as needed for mild pain. 04/17/14   [name], NP  Albuterol Sulfate 108 (90 Base) MCG/ACT AEPB Inhale 1 puff into the lungs every 6 (six) hours as needed (shortness of breath).     [provider]  aspirin 81 MG tablet Take 81 mg by mouth daily.     [provider]  atorvastatin (LIPITOR) 80 MG tablet TAKE 1 TABLET BY MOUTH EVERY DAY Patient taking differently: Take 80 mg by mouth daily.  03/08/19   Minna Merritts, MD  balsalazide (COLAZAL) 750 MG capsule TAKE 1 CAPSULE (750 MG TOTAL) BY MOUTH 3 (THREE) TIMES DAILY. Patient taking differently: Take 750 mg by mouth 3 (three) times daily.  11/28/18   Ladene Artist, MD  collagenase (SANTYL) ointment Apply 1 application topically daily. (apply to sacrum)    [provider]  digoxin (LANOXIN) 0.125 MG tablet Take 1 tablet (0.125 mg total) by mouth daily. 07/10/19   [name], MD  finasteride (PROSCAR) 5 MG tablet Take 1 tablet (5 mg total) by mouth daily. 03/20/19   Loletha Grayer, MD  insulin aspart (NOVOLOG) 100 UNIT/ML injection Inject 5 Units into the skin 3 (three) times daily with meals. Patient taking differently: Inject 5 Units into the skin 3 (three) times daily before meals.  03/19/19   Loletha Grayer, MD   ipratropium-albuterol (DUONEB) 0.5-2.5 (3) MG/3ML SOLN Take 3 mLs by nebulization every 4 (four) hours as needed (shortness of breath or wheezing).    [provider]  lactobacillus (FLORANEX/LACTINEX) PACK Take 1 packet (1 g total) by mouth 3 (three) times daily with meals. 03/19/19   Loletha Grayer, MD  linezolid (ZYVOX) 600 MG tablet Take 1 tablet (600 mg total) by mouth 2 (two) times daily. 10/01/19   Leonel Ramsay, MD  midodrine (PROAMATINE) 10 MG tablet Take 1 tablet (10 mg total) by mouth 3 (three) times daily. 04/26/19   Max Sane, MD  Multiple Vitamins-Minerals (DECUBI-VITE PO) Take 1 capsule by mouth daily.     [provider]  niacin (NIASPAN) 1000 MG CR tablet TAKE 1 TABLET BY MOUTH EVERYDAY AT BEDTIME 10/03/19   Gollan, Kathlene November, MD  Nutritional Supplements (FEEDING SUPPLEMENT, NEPRO CARB STEADY,) LIQD Take 237 mLs by mouth 2 (two) times daily between meals. 03/19/19   Loletha Grayer, MD  omeprazole (PRILOSEC) 20 MG capsule Take 20 mg by mouth daily.     [provider]  ondansetron (ZOFRAN) 4 MG tablet Take 4 mg by mouth every 8 (eight) hours as needed for nausea or vomiting.    [provider]  piperacillin-tazobactam (ZOSYN) IVPB Inject 13.5 g into the vein continuous for 20 days. Please infuse 13.5gm piperacillin/tazobactam over 24 hours as continuous infusion Indication: pseudomonas bacteremia and sacral wound Last Day of Therapy: 10/21/2019 Labs - Once weekly:  CBC/D, CMP and CRP 10/02/19 10/22/19  Enzo Bi, MD  sertraline (ZOLOFT) 25 MG tablet Take 25 mg by mouth daily.    [provider]  tamsulosin (FLOMAX) 0.4 MG CAPS capsule Take 1 capsule (0.4 mg total) by mouth daily after breakfast. 03/20/19   Loletha Grayer, MD  vitamin C (VITAMIN C) 500 MG tablet Take 1 tablet (500 mg total) by mouth 2 (two) times daily. 07/09/19   [name], MD  XARELTO 20 MG TABS tablet TAKE 1 TABLET BY MOUTH EVERY DAY WITH LUNCH Patient taking  differently: Take 20 mg by mouth daily with lunch.  05/17/19   Minna Merritts, MD    Allergies Iodine, Shrimp [shellfish allergy], and Tetracycline  Family History  Problem Relation Age of Onset  . Breast cancer Mother   . Heart disease Father   . Heart attack Father   . Colon cancer Neg Hx     Social History Social History   Tobacco Use  . Smoking status: Never Smoker  . Smokeless tobacco: Never Used  Substance Use Topics  . Alcohol use: No  . Drug use: No    Review of  Systems  Constitutional: Negative for fever. Cardiovascular: Negative for chest pain. Respiratory: Negative for shortness of breath. Gastrointestinal: Negative for abdominal pain, vomiting and diarrhea. Left lower quadrant colostomy bag noted. Genitourinary: Negative for dysuria. Musculoskeletal: Negative for back pain. Skin: Negative for rash. Neurological: Negative for headaches, focal weakness or numbness. ____________________________________________  PHYSICAL EXAM:  VITAL SIGNS: ED Triage Vitals  Enc Vitals Group     BP 10/09/19 2116 123/72     Pulse Rate 10/09/19 2116 94     Resp 10/09/19 2116 20     Temp 10/09/19 2116 (!) 97.5 F (36.4 C)     Temp Source 10/09/19 2116 Oral     SpO2 10/09/19 2116 99 %     Weight 10/09/19 2116 240 lb (108.9 kg)     Height 10/09/19 2116 5' 11"  (1.803 m)     Head Circumference --      Peak Flow --      Pain Score 10/09/19 2117 0     Pain Loc --      Pain Edu? --      Excl. in [place]? --     Constitutional: Alert and oriented. Well appearing and in no distress. Head: Normocephalic and atraumatic. Eyes: Conjunctivae are normal. Normal extraocular movements Cardiovascular: Normal rate, regular rhythm. Normal distal pulses. Respiratory: Normal respiratory effort. No wheezes/rales/rhonchi. Gastrointestinal: Soft and nontender. No distention. LLQ colostomy bag with formed stool at the stoma. There is moderate leakage of bowel contents to the lateral aspect of  the wafer. Musculoskeletal: Nontender with normal range of motion in all extremities.  Neurologic:  Normal gait without ataxia. Normal speech and language. No gross focal neurologic deficits are appreciated. Skin:  Skin is warm, dry and intact. No rash noted. Psychiatric: Mood and affect are normal. Patient exhibits appropriate insight and judgment. ____________________________________________  PROCEDURES  Colostomy Care Procedures ____________________________________________  INITIAL IMPRESSION / ASSESSMENT AND PLAN / ED COURSE  Patient with ED evaluation and request for colostomy bag care and replacement.  Patient presents from home via EMS for management of a loose colostomy bag after home health aide visit today.  Was provided with colostomy care and a new bag and wafer were applied.  He is discharged to follow-up with a primary provider as needed.  TAIQUAN CAMPANARO was evaluated in Emergency Department on 10/10/2019 for the symptoms described in the history of present illness. He was evaluated in the context of the global COVID-19 pandemic, which necessitated consideration that the patient might be at risk for infection with the SARS-CoV-2 virus that causes COVID-19. Institutional protocols and algorithms that pertain to the evaluation of patients at risk for COVID-19 are in a state of rapid change based on information released by regulatory bodies including the CDC and federal and state organizations. These policies and algorithms were followed during the patient's care in the ED. ____________________________________________  FINAL CLINICAL IMPRESSION(S) / ED DIAGNOSES  Final diagnoses:  Encounter for attention to colostomy [facility])      Carmie End, Dannielle Karvonen, PA-C 10/10/19 0004    Lavonia Drafts, MD 10/10/19 1506

## 2019-10-11 ENCOUNTER — Inpatient Hospital Stay: Payer: Medicare Other

## 2019-10-11 ENCOUNTER — Other Ambulatory Visit: Payer: Self-pay

## 2019-10-11 ENCOUNTER — Inpatient Hospital Stay
Admission: EM | Admit: 2019-10-11 | Discharge: 2019-10-23 | DRG: 682 | Disposition: A | Discharge status: Skilled Nursing Facility | Payer: Medicare Other | Source: Skilled Nursing Facility | Attending: Internal Medicine | Admitting: Internal Medicine

## 2019-10-11 DIAGNOSIS — D649 Anemia, unspecified: Secondary | ICD-10-CM | POA: Diagnosis present

## 2019-10-11 DIAGNOSIS — D509 Iron deficiency anemia, unspecified: Secondary | ICD-10-CM | POA: Diagnosis present

## 2019-10-11 DIAGNOSIS — I110 Hypertensive heart disease with heart failure: Secondary | ICD-10-CM | POA: Diagnosis present

## 2019-10-11 DIAGNOSIS — E876 Hypokalemia: Secondary | ICD-10-CM | POA: Diagnosis present

## 2019-10-11 DIAGNOSIS — I252 Old myocardial infarction: Secondary | ICD-10-CM

## 2019-10-11 DIAGNOSIS — I5022 Chronic systolic (congestive) heart failure: Secondary | ICD-10-CM | POA: Diagnosis present

## 2019-10-11 DIAGNOSIS — U071 COVID-19: Secondary | ICD-10-CM | POA: Diagnosis present

## 2019-10-11 DIAGNOSIS — D62 Acute posthemorrhagic anemia: Secondary | ICD-10-CM | POA: Diagnosis present

## 2019-10-11 DIAGNOSIS — M8668 Other chronic osteomyelitis, other site: Secondary | ICD-10-CM | POA: Diagnosis present

## 2019-10-11 DIAGNOSIS — I4891 Unspecified atrial fibrillation: Secondary | ICD-10-CM | POA: Diagnosis not present

## 2019-10-11 DIAGNOSIS — B965 Pseudomonas (aeruginosa) (mallei) (pseudomallei) as the cause of diseases classified elsewhere: Secondary | ICD-10-CM | POA: Diagnosis present

## 2019-10-11 DIAGNOSIS — R7881 Bacteremia: Secondary | ICD-10-CM | POA: Diagnosis present

## 2019-10-11 DIAGNOSIS — F329 Major depressive disorder, single episode, unspecified: Secondary | ICD-10-CM | POA: Diagnosis present

## 2019-10-11 DIAGNOSIS — Z1621 Resistance to vancomycin: Secondary | ICD-10-CM | POA: Diagnosis present

## 2019-10-11 DIAGNOSIS — N179 Acute kidney failure, unspecified: Secondary | ICD-10-CM

## 2019-10-11 DIAGNOSIS — Z95810 Presence of automatic (implantable) cardiac defibrillator: Secondary | ICD-10-CM

## 2019-10-11 DIAGNOSIS — Z933 Colostomy status: Secondary | ICD-10-CM

## 2019-10-11 DIAGNOSIS — I4892 Unspecified atrial flutter: Secondary | ICD-10-CM | POA: Diagnosis present

## 2019-10-11 DIAGNOSIS — Z6834 Body mass index (BMI) 34.0-34.9, adult: Secondary | ICD-10-CM

## 2019-10-11 DIAGNOSIS — A419 Sepsis, unspecified organism: Secondary | ICD-10-CM | POA: Diagnosis not present

## 2019-10-11 DIAGNOSIS — M8660 Other chronic osteomyelitis, unspecified site: Secondary | ICD-10-CM | POA: Diagnosis present

## 2019-10-11 DIAGNOSIS — E785 Hyperlipidemia, unspecified: Secondary | ICD-10-CM | POA: Diagnosis present

## 2019-10-11 DIAGNOSIS — E669 Obesity, unspecified: Secondary | ICD-10-CM | POA: Diagnosis present

## 2019-10-11 DIAGNOSIS — T3695XA Adverse effect of unspecified systemic antibiotic, initial encounter: Secondary | ICD-10-CM | POA: Diagnosis present

## 2019-10-11 DIAGNOSIS — Z888 Allergy status to other drugs, medicaments and biological substances status: Secondary | ICD-10-CM

## 2019-10-11 DIAGNOSIS — Z7901 Long term (current) use of anticoagulants: Secondary | ICD-10-CM

## 2019-10-11 DIAGNOSIS — E872 Acidosis: Secondary | ICD-10-CM | POA: Diagnosis present

## 2019-10-11 DIAGNOSIS — I2510 Atherosclerotic heart disease of native coronary artery without angina pectoris: Secondary | ICD-10-CM | POA: Diagnosis present

## 2019-10-11 DIAGNOSIS — R197 Diarrhea, unspecified: Secondary | ICD-10-CM | POA: Diagnosis not present

## 2019-10-11 DIAGNOSIS — I482 Chronic atrial fibrillation, unspecified: Secondary | ICD-10-CM | POA: Diagnosis present

## 2019-10-11 DIAGNOSIS — Z23 Encounter for immunization: Secondary | ICD-10-CM | POA: Diagnosis not present

## 2019-10-11 DIAGNOSIS — D6959 Other secondary thrombocytopenia: Secondary | ICD-10-CM | POA: Diagnosis present

## 2019-10-11 DIAGNOSIS — B952 Enterococcus as the cause of diseases classified elsewhere: Secondary | ICD-10-CM | POA: Diagnosis present

## 2019-10-11 DIAGNOSIS — I4821 Permanent atrial fibrillation: Secondary | ICD-10-CM | POA: Diagnosis not present

## 2019-10-11 DIAGNOSIS — K219 Gastro-esophageal reflux disease without esophagitis: Secondary | ICD-10-CM | POA: Diagnosis present

## 2019-10-11 DIAGNOSIS — Z792 Long term (current) use of antibiotics: Secondary | ICD-10-CM

## 2019-10-11 DIAGNOSIS — L89154 Pressure ulcer of sacral region, stage 4: Secondary | ICD-10-CM | POA: Diagnosis present

## 2019-10-11 DIAGNOSIS — B964 Proteus (mirabilis) (morganii) as the cause of diseases classified elsewhere: Secondary | ICD-10-CM | POA: Diagnosis present

## 2019-10-11 DIAGNOSIS — I255 Ischemic cardiomyopathy: Secondary | ICD-10-CM | POA: Diagnosis present

## 2019-10-11 DIAGNOSIS — Z79899 Other long term (current) drug therapy: Secondary | ICD-10-CM

## 2019-10-11 DIAGNOSIS — I480 Paroxysmal atrial fibrillation: Secondary | ICD-10-CM | POA: Diagnosis not present

## 2019-10-11 DIAGNOSIS — Z9861 Coronary angioplasty status: Secondary | ICD-10-CM | POA: Diagnosis not present

## 2019-10-11 DIAGNOSIS — R627 Adult failure to thrive: Secondary | ICD-10-CM | POA: Diagnosis present

## 2019-10-11 DIAGNOSIS — K5080 Crohn's disease of both small and large intestine without complications: Secondary | ICD-10-CM | POA: Diagnosis present

## 2019-10-11 DIAGNOSIS — I4819 Other persistent atrial fibrillation: Secondary | ICD-10-CM | POA: Diagnosis present

## 2019-10-11 DIAGNOSIS — G4733 Obstructive sleep apnea (adult) (pediatric): Secondary | ICD-10-CM | POA: Diagnosis present

## 2019-10-11 DIAGNOSIS — Z7982 Long term (current) use of aspirin: Secondary | ICD-10-CM

## 2019-10-11 DIAGNOSIS — I959 Hypotension, unspecified: Secondary | ICD-10-CM | POA: Diagnosis present

## 2019-10-11 DIAGNOSIS — Z794 Long term (current) use of insulin: Secondary | ICD-10-CM

## 2019-10-11 DIAGNOSIS — D696 Thrombocytopenia, unspecified: Secondary | ICD-10-CM | POA: Diagnosis present

## 2019-10-11 DIAGNOSIS — I10 Essential (primary) hypertension: Secondary | ICD-10-CM | POA: Diagnosis present

## 2019-10-11 DIAGNOSIS — G2581 Restless legs syndrome: Secondary | ICD-10-CM | POA: Diagnosis present

## 2019-10-11 DIAGNOSIS — I4811 Longstanding persistent atrial fibrillation: Secondary | ICD-10-CM | POA: Diagnosis not present

## 2019-10-11 DIAGNOSIS — A09 Infectious gastroenteritis and colitis, unspecified: Secondary | ICD-10-CM

## 2019-10-11 DIAGNOSIS — Z91013 Allergy to seafood: Secondary | ICD-10-CM

## 2019-10-11 DIAGNOSIS — Z881 Allergy status to other antibiotic agents status: Secondary | ICD-10-CM

## 2019-10-11 DIAGNOSIS — Z955 Presence of coronary angioplasty implant and graft: Secondary | ICD-10-CM

## 2019-10-11 LAB — BRAIN NATRIURETIC PEPTIDE: B Natriuretic Peptide: 1655 pg/mL — ABNORMAL HIGH (ref 0.0–100.0)

## 2019-10-11 LAB — CBC WITH DIFFERENTIAL/PLATELET
Abs Immature Granulocytes: 0.06 10*3/uL (ref 0.00–0.07)
Basophils Absolute: 0.1 10*3/uL (ref 0.0–0.1)
Basophils Relative: 1 %
Eosinophils Absolute: 0.3 10*3/uL (ref 0.0–0.5)
Eosinophils Relative: 3 %
HCT: 27.8 % — ABNORMAL LOW (ref 39.0–52.0)
Hemoglobin: 8.5 g/dL — ABNORMAL LOW (ref 13.0–17.0)
Immature Granulocytes: 1 %
Lymphocytes Relative: 12 %
Lymphs Abs: 1.4 10*3/uL (ref 0.7–4.0)
MCH: 26.6 pg (ref 26.0–34.0)
MCHC: 30.6 g/dL (ref 30.0–36.0)
MCV: 86.9 fL (ref 80.0–100.0)
Monocytes Absolute: 0.3 10*3/uL (ref 0.1–1.0)
Monocytes Relative: 2 %
Neutro Abs: 9.9 10*3/uL — ABNORMAL HIGH (ref 1.7–7.7)
Neutrophils Relative %: 81 %
Platelets: 109 10*3/uL — ABNORMAL LOW (ref 150–400)
RBC: 3.2 MIL/uL — ABNORMAL LOW (ref 4.22–5.81)
RDW: 23 % — ABNORMAL HIGH (ref 11.5–15.5)
Smear Review: NORMAL
WBC: 12 10*3/uL — ABNORMAL HIGH (ref 4.0–10.5)
nRBC: 0.2 % (ref 0.0–0.2)

## 2019-10-11 LAB — COMPREHENSIVE METABOLIC PANEL
ALT: 15 U/L (ref 0–44)
AST: 19 U/L (ref 15–41)
Albumin: 2.1 g/dL — ABNORMAL LOW (ref 3.5–5.0)
Alkaline Phosphatase: 88 U/L (ref 38–126)
Anion gap: 16 — ABNORMAL HIGH (ref 5–15)
BUN: 73 mg/dL — ABNORMAL HIGH (ref 8–23)
CO2: 16 mmol/L — ABNORMAL LOW (ref 22–32)
Calcium: 7.9 mg/dL — ABNORMAL LOW (ref 8.9–10.3)
Chloride: 107 mmol/L (ref 98–111)
Creatinine, Ser: 4.64 mg/dL — ABNORMAL HIGH (ref 0.61–1.24)
GFR calc Af Amer: 14 mL/min — ABNORMAL LOW (ref >60)
GFR calc non Af Amer: 12 mL/min — ABNORMAL LOW (ref >60)
Glucose, Bld: 94 mg/dL (ref 70–99)
Potassium: 3.9 mmol/L (ref 3.5–5.1)
Sodium: 139 mmol/L (ref 135–145)
Total Bilirubin: 0.9 mg/dL (ref 0.3–1.2)
Total Protein: 6.2 g/dL — ABNORMAL LOW (ref 6.5–8.1)

## 2019-10-11 LAB — APTT: aPTT: 41 seconds — ABNORMAL HIGH (ref 24–36)

## 2019-10-11 LAB — DIGOXIN LEVEL: Digoxin Level: 0.3 ng/mL — ABNORMAL LOW (ref 0.8–2.0)

## 2019-10-11 LAB — C DIFFICILE QUICK SCREEN W PCR REFLEX
C Diff antigen: NEGATIVE
C Diff interpretation: NOT DETECTED
C Diff toxin: NEGATIVE

## 2019-10-11 LAB — PROTIME-INR
INR: 1.4 — ABNORMAL HIGH (ref 0.8–1.2)
Prothrombin Time: 16.6 seconds — ABNORMAL HIGH (ref 11.4–15.2)

## 2019-10-11 LAB — LACTATE DEHYDROGENASE: LDH: 194 U/L — ABNORMAL HIGH (ref 98–192)

## 2019-10-11 LAB — SAVE SMEAR(SSMR), FOR PROVIDER SLIDE REVIEW

## 2019-10-11 LAB — HEPARIN LEVEL (UNFRACTIONATED): Heparin Unfractionated: 1.42 IU/mL — ABNORMAL HIGH (ref 0.30–0.70)

## 2019-10-11 LAB — LACTIC ACID, PLASMA: Lactic Acid, Venous: 1.5 mmol/L (ref 0.5–1.9)

## 2019-10-11 MED ORDER — ONDANSETRON HCL 4 MG/2ML IJ SOLN
4.0000 mg | Freq: Three times a day (TID) | INTRAMUSCULAR | Status: DC | PRN
  Administered 2019-10-13: 4 mg via INTRAVENOUS
  Filled 2019-10-11: qty 2

## 2019-10-11 MED ORDER — ZINC SULFATE 220 (50 ZN) MG PO CAPS
220.0000 mg | ORAL_CAPSULE | Freq: Every day | ORAL | Status: DC
  Administered 2019-10-12 – 2019-10-23 (×12): 220 mg via ORAL
  Filled 2019-10-11 (×13): qty 1

## 2019-10-11 MED ORDER — ASCORBIC ACID 500 MG PO TABS
500.0000 mg | ORAL_TABLET | Freq: Two times a day (BID) | ORAL | Status: DC
  Administered 2019-10-12 – 2019-10-23 (×23): 500 mg via ORAL
  Filled 2019-10-11 (×23): qty 1

## 2019-10-11 MED ORDER — DIGOXIN 125 MCG PO TABS: 0.1250 mg | ORAL_TABLET | Freq: Every day | ORAL | Status: DC

## 2019-10-11 MED ORDER — ACETAMINOPHEN 325 MG PO TABS: 650.0000 mg | ORAL_TABLET | Freq: Four times a day (QID) | ORAL | Status: DC | PRN

## 2019-10-11 MED ORDER — LACTATED RINGERS IV BOLUS
250.0000 mL | Freq: Once | INTRAVENOUS | Status: AC
  Administered 2019-10-11: 250 mL via INTRAVENOUS

## 2019-10-11 MED ORDER — NEPRO/CARBSTEADY PO LIQD
237.0000 mL | Freq: Two times a day (BID) | ORAL | Status: DC
  Administered 2019-10-12: 237 mL via ORAL

## 2019-10-11 MED ORDER — LOPERAMIDE HCL 2 MG PO CAPS
2.0000 mg | ORAL_CAPSULE | Freq: Three times a day (TID) | ORAL | Status: DC
  Administered 2019-10-12 – 2019-10-23 (×34): 2 mg via ORAL
  Filled 2019-10-11 (×35): qty 1

## 2019-10-11 MED ORDER — SODIUM BICARBONATE 8.4 % IV SOLN
100.0000 meq | Freq: Once | INTRAVENOUS | Status: AC
  Administered 2019-10-12: 100 meq via INTRAVENOUS
  Filled 2019-10-11: qty 100

## 2019-10-11 MED ORDER — IPRATROPIUM-ALBUTEROL 0.5-2.5 (3) MG/3ML IN SOLN: 3.0000 mL | RESPIRATORY_TRACT | Status: DC | PRN

## 2019-10-11 MED ORDER — FINASTERIDE 5 MG PO TABS
5.0000 mg | ORAL_TABLET | Freq: Every day | ORAL | Status: DC
  Administered 2019-10-12 – 2019-10-23 (×12): 5 mg via ORAL
  Filled 2019-10-11 (×12): qty 1

## 2019-10-11 MED ORDER — DECUBI-VITE PO CAPS: ORAL_CAPSULE | Freq: Every day | ORAL | Status: DC

## 2019-10-11 MED ORDER — CHLORHEXIDINE GLUCONATE CLOTH 2 % EX PADS
6.0000 | MEDICATED_PAD | Freq: Every day | CUTANEOUS | Status: DC
  Administered 2019-10-12 – 2019-10-23 (×11): 6 via TOPICAL

## 2019-10-11 MED ORDER — ALBUTEROL SULFATE 108 (90 BASE) MCG/ACT IN AEPB: 1.0000 | INHALATION_SPRAY | Freq: Four times a day (QID) | RESPIRATORY_TRACT | Status: DC | PRN

## 2019-10-11 MED ORDER — STERILE WATER FOR INJECTION IV SOLN
INTRAVENOUS | Status: DC
  Filled 2019-10-11 (×8): qty 850

## 2019-10-11 MED ORDER — PIPERACILLIN-TAZOBACTAM IV (FOR PTA / DISCHARGE USE ONLY): 13.5000 g | INTRAVENOUS | Status: DC

## 2019-10-11 MED ORDER — RIVAROXABAN 20 MG PO TABS: 20.0000 mg | ORAL_TABLET | Freq: Every day | ORAL | Status: DC

## 2019-10-11 MED ORDER — PANTOPRAZOLE SODIUM 40 MG PO TBEC
40.0000 mg | DELAYED_RELEASE_TABLET | Freq: Every day | ORAL | Status: DC
  Administered 2019-10-12 – 2019-10-23 (×12): 40 mg via ORAL
  Filled 2019-10-11 (×12): qty 1

## 2019-10-11 MED ORDER — ADULT MULTIVITAMIN W/MINERALS CH
1.0000 | ORAL_TABLET | Freq: Every day | ORAL | Status: DC
  Administered 2019-10-12 – 2019-10-23 (×12): 1 via ORAL
  Filled 2019-10-11 (×12): qty 1

## 2019-10-11 MED ORDER — FLORANEX PO PACK
1.0000 g | PACK | Freq: Three times a day (TID) | ORAL | Status: DC
  Administered 2019-10-12 – 2019-10-23 (×26): 1 g via ORAL
  Filled 2019-10-11 (×43): qty 1

## 2019-10-11 MED ORDER — MIDODRINE HCL 5 MG PO TABS
2.5000 mg | ORAL_TABLET | Freq: Three times a day (TID) | ORAL | Status: DC
  Administered 2019-10-11 – 2019-10-12 (×2): 2.5 mg via ORAL
  Filled 2019-10-11 (×2): qty 1

## 2019-10-11 MED ORDER — TAMSULOSIN HCL 0.4 MG PO CAPS
0.4000 mg | ORAL_CAPSULE | Freq: Every day | ORAL | Status: DC
  Administered 2019-10-12 – 2019-10-16 (×5): 0.4 mg via ORAL
  Filled 2019-10-11 (×5): qty 1

## 2019-10-11 MED ORDER — INFLUENZA VAC A&B SA ADJ QUAD 0.5 ML IM PRSY
0.5000 mL | PREFILLED_SYRINGE | INTRAMUSCULAR | Status: DC
  Filled 2019-10-11: qty 0.5

## 2019-10-11 MED ORDER — ACETAMINOPHEN 650 MG RE SUPP: 650.0000 mg | Freq: Four times a day (QID) | RECTAL | Status: DC | PRN

## 2019-10-11 MED ORDER — HEPARIN (PORCINE) 25000 UT/250ML-% IV SOLN
1300.0000 [IU]/h | INTRAVENOUS | Status: DC
  Administered 2019-10-11: 1300 [IU]/h via INTRAVENOUS
  Filled 2019-10-11: qty 250

## 2019-10-11 MED ORDER — BALSALAZIDE DISODIUM 750 MG PO CAPS
750.0000 mg | ORAL_CAPSULE | Freq: Three times a day (TID) | ORAL | Status: DC
  Administered 2019-10-17 – 2019-10-23 (×16): 750 mg via ORAL
  Filled 2019-10-11 (×20): qty 1

## 2019-10-11 MED ORDER — DIGOXIN 0.25 MG/ML IJ SOLN
0.0625 mg | Freq: Once | INTRAMUSCULAR | Status: AC
  Administered 2019-10-12: 0.0625 mg via INTRAVENOUS
  Filled 2019-10-11: qty 2

## 2019-10-11 MED ORDER — ATORVASTATIN CALCIUM 80 MG PO TABS
80.0000 mg | ORAL_TABLET | Freq: Every day | ORAL | Status: DC
  Administered 2019-10-12 – 2019-10-16 (×5): 80 mg via ORAL
  Filled 2019-10-11 (×5): qty 1

## 2019-10-11 MED ORDER — SODIUM CHLORIDE 0.9 % IV BOLUS
1000.0000 mL | Freq: Once | INTRAVENOUS | Status: AC
  Administered 2019-10-11: 1000 mL via INTRAVENOUS

## 2019-10-11 MED ORDER — LINEZOLID 600 MG PO TABS
600.0000 mg | ORAL_TABLET | Freq: Two times a day (BID) | ORAL | Status: DC
  Administered 2019-10-12 – 2019-10-13 (×4): 600 mg via ORAL
  Filled 2019-10-11 (×6): qty 1

## 2019-10-11 MED ORDER — HEPARIN (PORCINE) 25000 UT/250ML-% IV SOLN: 1300.0000 [IU]/h | INTRAVENOUS | Status: DC

## 2019-10-11 MED ORDER — SODIUM BICARBONATE 650 MG PO TABS: 1300.0000 mg | ORAL_TABLET | Freq: Three times a day (TID) | ORAL | Status: DC

## 2019-10-11 MED ORDER — COLLAGENASE 250 UNIT/GM EX OINT
1.0000 "application " | TOPICAL_OINTMENT | Freq: Every day | CUTANEOUS | Status: DC
  Administered 2019-10-11 – 2019-10-23 (×10): 1 via TOPICAL
  Filled 2019-10-11: qty 30

## 2019-10-11 MED ORDER — ASPIRIN EC 81 MG PO TBEC
81.0000 mg | DELAYED_RELEASE_TABLET | Freq: Every day | ORAL | Status: DC
  Filled 2019-10-11 (×2): qty 1

## 2019-10-11 MED ORDER — SODIUM CHLORIDE 0.9 % IV SOLN: INTRAVENOUS | Status: DC

## 2019-10-11 NOTE — ED Provider Notes (Signed)
[facility] Emergency Department Provider Note  ____________________________________________   First MD Initiated Contact with Patient 10/11/19 [phone]     (approximate)  I have reviewed the triage vital signs and the nursing notes.   HISTORY  Chief Complaint Diarrhea    HPI Colton Lamb is a 70 y.o. male with atrial flutter on Xarelto, Crohn's with colostomy bag, diabetes who comes in concern for C. difficile.  Patient was positive for coronavirus on 2/22.  Patient was supposed to be on isolation until March 4.  Patient had no symptoms and never needed any Covid treatment.  Patient was discharged home on 3/3 for Zosyn infusion due to Pseudomonas bacteremia from his acute on chronic osteomyelitis.  Patient supposed to be on IV Zosyn until 3/22 and linezolid until 3/15.  Patient is coming back in for concerns for C. Difficile.  Per patient's home health nurse there was concern that patient may have C. difficile.  They have noticed that patient had increasing stool output from his colostomy bag over the past few days, nothing makes better, nothing makes it worse.  There is some redness noted around his colostomy bag which patient states he thinks has been there for about a week.  Patient's PICC line is on his right arm without any redness.  He does have a Foley in place that he says has been there since 2020 denies any urinary symptoms.  He denies any abdominal pain.            Past Medical History:  Diagnosis Date  . Atrial flutter ([place])    a. s/p Cardioversion 11/22/13, on amiodarone and Xarelto.  . CHF (congestive heart failure) ([place])   . Chronic osteomyelitis ([place]) 06/30/2019   s/p colostomy  . Chronic systolic heart failure ([place])    a. 10/2013 EF 20-25%, grade III DD, RV mildly dilated and sys fx mild/mod reduced;  b. 01/2014 Echo: EF 30-35%, gr3 DD, mod dil LA. c. 07/2019 EF 20-25%  . Coronary artery disease    a. s/p MI 2007/2015;  b. s/p prior PCI  to the LAD/LCX/PDA/PL;  c. 2008: s/p Cypher DES to the OM.  Marland Kitchen Crohn's ileocolitis ([place])   . GERD (gastroesophageal reflux disease)   . Hx of adenomatous colonic polyps 11/2003  . Hyperlipidemia   . Hypertension   . Hypotension   . Ischemic cardiomyopathy    s. 01/2014 s/p MDT [id number] [name] XT DR single lead AICD.  Marland Kitchen Obesity   . Paroxysmal atrial fibrillation (HCC)    a. CHA2DS2VASc = (CHF, HTN, agex1, DM)  . Sleep apnea   . Syncope    a.  11/2013 in setting of volume depletion and bradycardia due to dig toxicity   . Type II diabetes mellitus [facility])     Patient Active Problem List   Diagnosis Date Noted  . Encounter for competency evaluation   . Acute respiratory distress syndrome (ARDS) due to COVID-19 virus ([place]) 09/29/2019  . Bacteremia due to Pseudomonas 09/29/2019  . Goals of care, counseling/discussion   . Palliative care by specialist   . HCAP (healthcare-associated pneumonia) 09/23/2019  . Colostomy in place [facility]) 09/23/2019  . Bleeding 07/11/2019  . Chronic osteomyelitis ([place]) 06/30/2019  . PICC (peripherally inserted central catheter) flush   . Normocytic anemia 06/21/2019  . Decubitus ulcer of sacral region, unstageable ([place]) 06/19/2019  . Hypotension 06/19/2019  . Anemia   . Acute respiratory failure ([place]) 06/12/2019  . Acute on chronic congestive heart failure ([place])   .  Acute blood loss anemia   . Pressure injury of skin 03/17/2019  . Acute renal failure (ARF) ([place])   . Empyema lung ([place])   . Shortness of breath   . Pleural effusion on right   . Sepsis ([place]) 03/04/2019  . Restless legs syndrome (RLS) 06/16/2017  . Hypertension, essential 06/16/2017  . CAD in native artery 04/27/2017  . Hydrocephalus ([name]) 09/08/2016  . Dizziness and giddiness   . Elevated troponin 09/06/2016  . Type II diabetes mellitus ([place])   . Ischemic cardiomyopathy   . Hyponatremia 07/01/2015  . Diabetes mellitus type 2, uncontrolled, with complications ([place]) 00/92/3300  . ICD  (implantable cardioverter-defibrillator) in place 05/27/2014  . OSA (obstructive sleep apnea) 12/20/2013  . Morbid obesity ([place]) 12/20/2013  . AKI (acute kidney injury) - Creatinine improved at d/c 12/14/2013  . Elevated TSH - will need f/u TFTs with PCP in 3-4 weeks 12/14/2013  . Junctional bradycardia - resolved 12/14/2013  . Syncope - due to bradycardia in setting of Digoxin Toxicity 12/11/2013  . Acute on chronic HFrEF (heart failure with reduced ejection fraction) ([place]) 12/06/2013  . At risk for sudden cardiac death - on LifeVest 18-Dec-2013  . Acute on chronic combined systolic and diastolic CHF (congestive heart failure) ([place]) 12/18/13  . Cardiomyopathy, ischemic 12-18-2013  . Atrial flutter ([place]) 11/22/2013  . NSTEMI (non-ST elevated myocardial infarction) ([place]) 11/22/2013  . Long term current use of anticoagulant 10/19/2010  . Hyperlipidemia 05/06/2010  . Edema 05/06/2010  . CAD S/P percutaneous coronary angioplasty - multiple PCIs 11/11/2008  . Atrial fibrillation ([place]) 11/11/2008  . GERD 11/11/2008  . [place] INTESTINE 11/11/2008  . COLONIC POLYPS, HX OF 11/11/2008  . [place] ALLERGY 11/11/2008    Past Surgical History:  Procedure Laterality Date  . ATRIAL FLUTTER ABLATION N/A 04/16/2014   Procedure: ATRIAL FLUTTER ABLATION;  Surgeon: Evans Lance, MD;  Location: [facility] CATH LAB;  Service: Cardiovascular;  Laterality: N/A;  . CARDIAC CATHETERIZATION  10/2013  . CARDIAC DEFIBRILLATOR PLACEMENT  04/16/2014   Medtronic Evira device  . CARDIAC ELECTROPHYSIOLOGY STUDY AND ABLATION  04/16/2014   atrial flutter ablation  . CARDIOVERSION N/A 03/05/2014   Procedure: CARDIOVERSION;  Surgeon: Jolaine Artist, MD;  Location: [facility] ENDOSCOPY;  Service: Cardiovascular;  Laterality: N/A;  . CATARACT EXTRACTION W/PHACO Right 01/04/2017   Procedure: CATARACT EXTRACTION PHACO AND INTRAOCULAR LENS PLACEMENT ([place])  Right Diabetic Complicated;  Surgeon: Leandrew Koyanagi,  MD;  Location: [place];  Service: Ophthalmology;  Laterality: Right;  Diabetic  . CATARACT EXTRACTION W/PHACO Left 02/08/2017   Procedure: CATARACT EXTRACTION PHACO AND INTRAOCULAR LENS PLACEMENT ([place]) left diabetic;  Surgeon: Leandrew Koyanagi, MD;  Location: [place];  Service: Ophthalmology;  Laterality: Left;  Diabetic - oral meds sleep apnea  . CORONARY ANGIOPLASTY WITH STENT PLACEMENT  2007; 2008 X 2   "1+1 ~ 1"  . FOOT SURGERY Left    bone spur  . HYDROCELE EXCISION Bilateral   . 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 IMPLANTABLE CARDIOVERTER DEFIBRILLATOR IMPLANT N/A 04/16/2014   Procedure: IMPLANTABLE CARDIOVERTER DEFIBRILLATOR IMPLANT;  Surgeon: Evans Lance, MD;  Location: [facility] CATH LAB;  Service: Cardiovascular;  Laterality: N/A;  . LEFT HEART CATHETERIZATION WITH CORONARY ANGIOGRAM N/A 11/22/2013   Procedure: LEFT HEART CATHETERIZATION WITH CORONARY ANGIOGRAM;  Surgeon: Sinclair Grooms, MD;  Location: [facility] CATH LAB;  Service: Cardiovascular;  Laterality: N/A;  . TRANSVERSE LOOP COLOSTOMY N/A 07/03/2019   Procedure: TRANSVERSE  LOOP COLOSTOMY;  Surgeon: Ronny Bacon, MD;  Location: [facility] ORS;  Service: General;  Laterality: N/A;    Prior to Admission medications   Medication Sig Start Date End Date Taking? Authorizing Provider  acetaminophen (TYLENOL) 325 MG tablet Take 1-2 tablets (325-650 mg total) by mouth every 4 (four) hours as needed for mild pain. 04/17/14   [name], NP  Albuterol Sulfate 108 (90 Base) MCG/ACT AEPB Inhale 1 puff into the lungs every 6 (six) hours as needed (shortness of breath).     [provider]  aspirin 81 MG tablet Take 81 mg by mouth daily.     [provider]  atorvastatin (LIPITOR) 80 MG tablet TAKE 1 TABLET BY MOUTH EVERY DAY Patient taking differently: Take 80 mg by mouth daily.  03/08/19   Minna Merritts, MD  balsalazide (COLAZAL) 750 MG capsule TAKE 1 CAPSULE  (750 MG TOTAL) BY MOUTH 3 (THREE) TIMES DAILY. Patient taking differently: Take 750 mg by mouth 3 (three) times daily.  11/28/18   Ladene Artist, MD  collagenase (SANTYL) ointment Apply 1 application topically daily. (apply to sacrum)    [provider]  digoxin (LANOXIN) 0.125 MG tablet Take 1 tablet (0.125 mg total) by mouth daily. 07/10/19   [name], MD  finasteride (PROSCAR) 5 MG tablet Take 1 tablet (5 mg total) by mouth daily. 03/20/19   Loletha Grayer, MD  insulin aspart (NOVOLOG) 100 UNIT/ML injection Inject 5 Units into the skin 3 (three) times daily with meals. Patient taking differently: Inject 5 Units into the skin 3 (three) times daily before meals.  03/19/19   Loletha Grayer, MD  ipratropium-albuterol (DUONEB) 0.5-2.5 (3) MG/3ML SOLN Take 3 mLs by nebulization every 4 (four) hours as needed (shortness of breath or wheezing).    [provider]  lactobacillus (FLORANEX/LACTINEX) PACK Take 1 packet (1 g total) by mouth 3 (three) times daily with meals. 03/19/19   Loletha Grayer, MD  linezolid (ZYVOX) 600 MG tablet Take 1 tablet (600 mg total) by mouth 2 (two) times daily. 10/01/19   Leonel Ramsay, MD  midodrine (PROAMATINE) 10 MG tablet Take 1 tablet (10 mg total) by mouth 3 (three) times daily. 04/26/19   Max Sane, MD  Multiple Vitamins-Minerals (DECUBI-VITE PO) Take 1 capsule by mouth daily.     [provider]  niacin (NIASPAN) 1000 MG CR tablet TAKE 1 TABLET BY MOUTH EVERYDAY AT BEDTIME 10/03/19   Gollan, Kathlene November, MD  Nutritional Supplements (FEEDING SUPPLEMENT, NEPRO CARB STEADY,) LIQD Take 237 mLs by mouth 2 (two) times daily between meals. 03/19/19   Loletha Grayer, MD  omeprazole (PRILOSEC) 20 MG capsule Take 20 mg by mouth daily.     [provider]  ondansetron (ZOFRAN) 4 MG tablet Take 4 mg by mouth every 8 (eight) hours as needed for nausea or vomiting.    [provider]  piperacillin-tazobactam (ZOSYN) IVPB Inject  13.5 g into the vein continuous for 20 days. Please infuse 13.5gm piperacillin/tazobactam over 24 hours as continuous infusion Indication: pseudomonas bacteremia and sacral wound Last Day of Therapy: 10/21/2019 Labs - Once weekly:  CBC/D, CMP and CRP 10/02/19 10/22/19  Enzo Bi, MD  sertraline (ZOLOFT) 25 MG tablet Take 25 mg by mouth daily.    [provider]  tamsulosin (FLOMAX) 0.4 MG CAPS capsule Take 1 capsule (0.4 mg total) by mouth daily after breakfast. 03/20/19   Loletha Grayer, MD  vitamin C (VITAMIN C) 500 MG tablet Take 1  tablet (500 mg total) by mouth 2 (two) times daily. 07/09/19   [name], MD  XARELTO 20 MG TABS tablet TAKE 1 TABLET BY MOUTH EVERY DAY WITH LUNCH Patient taking differently: Take 20 mg by mouth daily with lunch.  05/17/19   Minna Merritts, MD    Allergies Iodine, Shrimp [shellfish allergy], and Tetracycline  Family History  Problem Relation Age of Onset  . Breast cancer Mother   . Heart disease Father   . Heart attack Father   . Colon cancer Neg Hx     Social History Social History   Tobacco Use  . Smoking status: Never Smoker  . Smokeless tobacco: Never Used  Substance Use Topics  . Alcohol use: No  . Drug use: No      Review of Systems Constitutional: No fever/chills Eyes: No visual changes. ENT: No sore throat. Cardiovascular: Denies chest pain. Respiratory: Denies shortness of breath. Gastrointestinal: No abdominal pain.  No nausea, no vomiting.  No diarrhea.  No constipation.  Increased output from his colostomy bag Genitourinary: Negative for dysuria. Musculoskeletal: Negative for back pain. Skin: Negative for rash. Neurological: Negative for headaches, focal weakness or numbness. All other ROS negative ____________________________________________   PHYSICAL EXAM:  VITAL SIGNS: ED Triage Vitals  Blood pressure 128/86, pulse 84, temperature 98.5 F (36.9 C), temperature source Oral, resp. rate 14, height 5' 11"   (1.803 m), weight 108 kg, SpO2 100 %.   Constitutional: Alert and oriented. Well appearing and in no acute distress. Eyes: Conjunctivae are normal. EOMI. Head: Atraumatic. Nose: No congestion/rhinnorhea. Mouth/Throat: Mucous membranes are moist.   Neck: No stridor. Trachea Midline. FROM Cardiovascular: Normal rate, regular rhythm. Grossly normal heart sounds.  Good peripheral circulation. Respiratory: Normal respiratory effort.  No retractions. Lungs CTAB. Gastrointestinal: Soft and nontender. No distention. No abdominal bruits.  Colostomy bag with stool in it.  Some redness around the outside of his colostomy bag. Musculoskeletal: No lower extremity tenderness nor edema.  No joint effusions.  PICC line in the right arm without any erythema Neurologic:  Normal speech and language. No gross focal neurologic deficits are appreciated.  Skin:  Skin is warm, dry and intact. No rash noted. Psychiatric: Mood and affect are normal. Speech and behavior are normal. GU: Foley in place  ____________________________________________   LABS (all labs ordered are listed, but only abnormal results are displayed)  Labs Reviewed  CBC WITH DIFFERENTIAL/PLATELET - Abnormal; Notable for the following components:      Result Value   WBC 12.0 (*)    RBC 3.20 (*)    Hemoglobin 8.5 (*)    HCT 27.8 (*)    RDW 23.0 (*)    Platelets 109 (*)    Neutro Abs 9.9 (*)    All other components within normal limits  COMPREHENSIVE METABOLIC PANEL - Abnormal; Notable for the following components:   CO2 16 (*)    BUN 73 (*)    Creatinine, Ser 4.64 (*)    Calcium 7.9 (*)    Total Protein 6.2 (*)    Albumin 2.1 (*)    GFR calc non Af Amer 12 (*)    GFR calc Af Amer 14 (*)    Anion gap 16 (*)    All other components within normal limits  GI PATHOGEN PANEL BY PCR, STOOL  C DIFFICILE QUICK SCREEN W PCR REFLEX  BRAIN NATRIURETIC PEPTIDE   ____________________________________________   INITIAL IMPRESSION /  ASSESSMENT AND PLAN / ED COURSE  [name]  was evaluated in Emergency Department on 10/11/2019 for the symptoms described in the history of present illness. He was evaluated in the context of the global COVID-19 pandemic, which necessitated consideration that the patient might be at risk for infection with the SARS-CoV-2 virus that causes COVID-19. Institutional protocols and algorithms that pertain to the evaluation of patients at risk for COVID-19 are in a state of rapid change based on information released by regulatory bodies including the CDC and federal and state organizations. These policies and algorithms were followed during the patient's care in the ED.    Patient is a 69 year old gentleman with complex history on Zosyn and linezolid who comes in with concern for increased output from his ostomy bag.  Patient also does have a little bit of redness around the outside concerning for possible cellulitis although patient already on linezolid and Zosyn.  Will get labs to evaluate for leukocytosis, AKI and will test for GI pathogens versus C. difficile.  Abdomen soft nontender I will suspicion for toxic megacolon or perforation at this time.  Patient kidney function has significantly elevated to 4.6 from baseline normal.  We will make sure that his Foley is not retaining.  We will give a liter of fluid white count has been downtrending from his previous admission.  Will need discussed with hospital team for admission for new AKI.  Bladder scan without retention.  Will give patient fluids.  Will discuss possible team for admission     ____________________________________________   FINAL CLINICAL IMPRESSION(S) / ED DIAGNOSES   Final diagnoses:  Diarrhea of infectious origin  AKI (acute kidney injury) ([place])      MEDICATIONS GIVEN DURING THIS VISIT:  Medications  ondansetron (ZOFRAN) injection 4 mg (has no administration in time range)  sodium chloride 0.9 % bolus 1,000 mL (1,000  mLs Intravenous New Bag/Given 10/11/19 1115)     ED Discharge Orders    None       Note:  This document was prepared using Dragon voice recognition software and may include unintentional dictation errors.   Vanessa [place], MD 10/11/19 1118

## 2019-10-11 NOTE — ED Notes (Signed)
Report given to Jun, RN

## 2019-10-11 NOTE — Care Management (Signed)
RN CM: Spoke to [place] with [facility] states they will not accept patient back at discharge unless he has gained 24 hour care. Colton Lamb states patient needs SNF.

## 2019-10-11 NOTE — Progress Notes (Signed)
Received pt with continuous Zosyn running for 24hr in a bulb form. Notified pharmacy and MD after new order of Zosyn was seen.

## 2019-10-11 NOTE — Progress Notes (Signed)
Received orders for patient to be transferred to 2A.

## 2019-10-11 NOTE — Consult Note (Signed)
Pharmacy Antibiotic Note  Colton Lamb is a 69 y.o. male admitted on 10/11/2019 with infected sacral decubitus ulcer. Patient with acute renal failure with Scr 4.64 (baseline 0.7-0.8). Recently admitted 2/22-3/3 for sepsis secondary to Pseudomonas bacteremia and osteomyelitis. He is on outpatient antibiotics including pip/tazo 13.5 g over 24h as a continuous infusion (planned stop date 3/22) and linezolid 600 mg q12h (planned stop date 3/15). Patient came in with pip/tazo infusion running now stopped. Pharmacy has been consulted for pip/tazo dosing. Linezolid has been resumed.  2/25 wound cultures with: E faecium, PSA, and proteus  Patient currently afebrile. WBC 12. Blood cultures pending.   Plan: Hold pip/tazo today and continue to follow antibiotic plan. Ok'd with MD.  Height: 5' 11"  (180.3 cm) Weight: 238 lb 1.6 oz (108 kg) IBW/kg (Calculated) : 75.3  Temp (24hrs), Avg:98 F (36.7 C), Min:97.5 F (36.4 C), Max:98.5 F (36.9 C)  Recent Labs  Lab 10/11/19 0933  WBC 12.0*  CREATININE 4.64*    Estimated Creatinine Clearance: 19.1 mL/min (A) (by C-G formula based on SCr of 4.64 mg/dL (H)).    Allergies  Allergen Reactions  . Iodine Other (See Comments)    Shortness of breath, swelling and hives  . Shrimp [Shellfish Allergy] Other (See Comments)    SWELLING    HIVES    SHORTNESS OF BREATH  . Tetracycline Rash    Antimicrobials this admission: Linezolid 3/12 >>  Pip/tazo (Hold) >>   Dose adjustments this admission: n/a  Microbiology results: 3/12 BCx: pending 3/12 C diff: negative 3/12 GI panel: pending   Thank you for allowing pharmacy to be a part of this patient's care.  [place] Resident 10/11/2019 7:28 PM

## 2019-10-11 NOTE — Consult Note (Addendum)
ANTICOAGULATION CONSULT NOTE - Initial Consult  Pharmacy Consult for Heparin Infusion Indication: atrial fibrillation  Allergies  Allergen Reactions  . Iodine Other (See Comments)    Shortness of breath, swelling and hives  . Shrimp [Shellfish Allergy] Other (See Comments)    SWELLING    HIVES    SHORTNESS OF BREATH  . Tetracycline Rash    Patient Measurements: Height: 5' 11"  (180.3 cm) Weight: 238 lb 1.6 oz (108 kg) IBW/kg (Calculated) : 75.3 Heparin Dosing Weight: 98.3 kg  Vital Signs: Temp: 97.5 F (36.4 C) (03/12 1559) Temp Source: Oral (03/12 1559) BP: 108/66 (03/12 1559) Pulse Rate: 84 (03/12 1559)  Labs: Recent Labs    10/11/19 0933  HGB 8.5*  HCT 27.8*  PLT 109*  CREATININE 4.64*    Estimated Creatinine Clearance: 19.1 mL/min (A) (by C-G formula based on SCr of 4.64 mg/dL (H)).   Medical History: Past Medical History:  Diagnosis Date  . Atrial flutter ([place])    a. s/p Cardioversion 11/22/13, on amiodarone and Xarelto.  . CHF (congestive heart failure) ([place])   . Chronic osteomyelitis ([place]) 06/30/2019   s/p colostomy  . Chronic systolic heart failure ([place])    a. 10/2013 EF 20-25%, grade III DD, RV mildly dilated and sys fx mild/mod reduced;  b. 01/2014 Echo: EF 30-35%, gr3 DD, mod dil LA. c. 07/2019 EF 20-25%  . Coronary artery disease    a. s/p MI 2007/2015;  b. s/p prior PCI to the LAD/LCX/PDA/PL;  c. 2008: s/p Cypher DES to the OM.  Marland Kitchen Crohn's ileocolitis ([place])   . GERD (gastroesophageal reflux disease)   . Hx of adenomatous colonic polyps 11/2003  . Hyperlipidemia   . Hypertension   . Hypotension   . Ischemic cardiomyopathy    s. 01/2014 s/p MDT [id number] [name] XT DR single lead AICD.  Marland Kitchen Obesity   . Paroxysmal atrial fibrillation (HCC)    a. CHA2DS2VASc = (CHF, HTN, agex1, DM)  . Sleep apnea   . Syncope    a.  11/2013 in setting of volume depletion and bradycardia due to dig toxicity   . Type II diabetes mellitus (HCC)     Medications:   Rivaroxaban 20 mg daily (Patient reported last dose was 3/11)  Assessment: Patient is a 69 y/o M with a history of atrial fibrillation on rivaroxaban who is admitted with acute renal failure. Rivaroxaban on hold due to renal function. Pharmacy has been consulted to start heparin infusion.   Baseline CBC significant for a Hgb of 8.5 (appears c/w baseline), and platelets 109 (baseline is lower limit of normal). Baseline coags elevated with INR 1.4, aPTT 41, heparin level 1.42.   Goal of Therapy:  aPTT 66-102 seconds Monitor platelets by anticoagulation protocol: Yes   Plan:  -IV heparin at 1300 units/hr to start at least 24h after last dose of rivaroxaban -aPTT/HL tomorrow morning. Follow aPTTs for now in view of rivaroxaban interference with heparin level.  -Daily CBC per protocol  [place] Resident 10/11/2019,9:16 PM

## 2019-10-11 NOTE — ED Notes (Signed)
Bladder scan reveals 85 ml urine, Dr. Jari Pigg informed

## 2019-10-11 NOTE — H&P (Addendum)
History and Physical    Colton Lamb LZJ:[id number] DOB: [birth year] DOA: 10/11/2019  Referring MD/NP/PA:   PCP: [name], MD   Patient coming from:  The patient is coming from home.  At baseline, pt is partially dependent for most of ADL.        Chief Complaint: diarrhea   HPI: Colton Lamb is a 69 y.o. male with medical history significant of hypertension, hyperlipidemia, diabetes mellitus, GERD, depression, PAF on Xarelto, OSA, Crohn's disease, s/p of colostomy, indwelling Foley catheter, sCHF with EF 25-30%, chronic osteomyelitis, recent admission due to Pseudomonas bacteremia, hypotension, covid 19 infection 09/23/19, who presents with increased colostomy bag output.  Patient was recently hospitalized from 2/22-3/3 due to Pseudomonas bacteremia secondary to chronic osteomyelitis. Patient is supposed to be on IV Zosyn until 3/22 and linezolid until 3/15.  Per patient's home health nurse, there was concern that patient may have C. difficile.  They have noticed that patient had increasing stool output from his colostomy bag over the past few days.  Patient has nausea, no vomiting or abdominal pain.  No fever or chills.  Patient does not have cough, shortness of breath, chest pain.  No fever or chills. There is some redness noted around his colostomy bag which patient states he thinks has been there for about a week. Patient's PICC line is on his right arm without any redness.  He has Foley in place that he says has been there since 2020 denies any urinary symptoms.    ED Course: pt was found to have negative C. difficile PCR, WBC 12.0, BMP 1655, AKI with creatinine 4.43, BUN 73 (creatinine 0.82 recently), temperature normal, oxygen saturation 99% on room air, blood pressure 128/82, heart rate 84, RR 14.  Patient is admitted to [facility] bed as inpatient.  Nephrology, Dr. Holley Raring is consulted.   Review of Systems:   General: no fevers, chills, no body weight gain, has poor appetite,  has fatigue HEENT: no blurry vision, hearing changes or sore throat Respiratory: no dyspnea, coughing, wheezing CV: no chest pain, no palpitations GI: has nausea, vomiting, abdominal pain, has diarrhea, no constipation GU: no dysuria, burning on urination, increased urinary frequency, hematuria  Ext: no leg edema Neuro: no unilateral weakness, numbness, or tingling, no vision change or hearing loss Skin: Has sacral ulcer stage IV MSK: No muscle spasm, no deformity, no limitation of range of movement in spin Heme: No easy bruising.  Travel history: No recent long distant travel.  Allergy:  Allergies  Allergen Reactions  . Iodine Other (See Comments)    Shortness of breath, swelling and hives  . Shrimp [Shellfish Allergy] Other (See Comments)    SWELLING    HIVES    SHORTNESS OF BREATH  . Tetracycline Rash    Past Medical History:  Diagnosis Date  . Atrial flutter ([place])    a. s/p Cardioversion 11/22/13, on amiodarone and Xarelto.  . CHF (congestive heart failure) ([place])   . Chronic osteomyelitis ([place]) 06/30/2019   s/p colostomy  . Chronic systolic heart failure ([place])    a. 10/2013 EF 20-25%, grade III DD, RV mildly dilated and sys fx mild/mod reduced;  b. 01/2014 Echo: EF 30-35%, gr3 DD, mod dil LA. c. 07/2019 EF 20-25%  . Coronary artery disease    a. s/p MI 2007/2015;  b. s/p prior PCI to the LAD/LCX/PDA/PL;  c. 2008: s/p Cypher DES to the OM.  Marland Kitchen Crohn's ileocolitis ([place])   . GERD (gastroesophageal reflux disease)   .  Hx of adenomatous colonic polyps 11/2003  . Hyperlipidemia   . Hypertension   . Hypotension   . Ischemic cardiomyopathy    s. 01/2014 s/p MDT [id number] [name] XT DR single lead AICD.  Marland Kitchen Obesity   . Paroxysmal atrial fibrillation (HCC)    a. CHA2DS2VASc = (CHF, HTN, agex1, DM)  . Sleep apnea   . Syncope    a.  11/2013 in setting of volume depletion and bradycardia due to dig toxicity   . Type II diabetes mellitus ([place])     Past Surgical History:  Procedure  Laterality Date  . ATRIAL FLUTTER ABLATION N/A 04/16/2014   Procedure: ATRIAL FLUTTER ABLATION;  Surgeon: Evans Lance, MD;  Location: [facility] CATH LAB;  Service: Cardiovascular;  Laterality: N/A;  . CARDIAC CATHETERIZATION  10/2013  . CARDIAC DEFIBRILLATOR PLACEMENT  04/16/2014   Medtronic Evira device  . CARDIAC ELECTROPHYSIOLOGY STUDY AND ABLATION  04/16/2014   atrial flutter ablation  . CARDIOVERSION N/A 03/05/2014   Procedure: CARDIOVERSION;  Surgeon: Jolaine Artist, MD;  Location: [facility] ENDOSCOPY;  Service: Cardiovascular;  Laterality: N/A;  . CATARACT EXTRACTION W/PHACO Right 01/04/2017   Procedure: CATARACT EXTRACTION PHACO AND INTRAOCULAR LENS PLACEMENT ([place])  Right Diabetic Complicated;  Surgeon: Leandrew Koyanagi, MD;  Location: [employer];  Service: Ophthalmology;  Laterality: Right;  Diabetic  . CATARACT EXTRACTION W/PHACO Left 02/08/2017   Procedure: CATARACT EXTRACTION PHACO AND INTRAOCULAR LENS PLACEMENT ([place]) left diabetic;  Surgeon: Leandrew Koyanagi, MD;  Location: [facility];  Service: Ophthalmology;  Laterality: Left;  Diabetic - oral meds sleep apnea  . CORONARY ANGIOPLASTY WITH STENT PLACEMENT  2007; 2008 X 2   "1+1 ~ 1"  . FOOT SURGERY Left    bone spur  . HYDROCELE EXCISION Bilateral   . 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 IMPLANTABLE CARDIOVERTER DEFIBRILLATOR IMPLANT N/A 04/16/2014   Procedure: IMPLANTABLE CARDIOVERTER DEFIBRILLATOR IMPLANT;  Surgeon: Evans Lance, MD;  Location: [facility] CATH LAB;  Service: Cardiovascular;  Laterality: N/A;  . LEFT HEART CATHETERIZATION WITH CORONARY ANGIOGRAM N/A 11/22/2013   Procedure: LEFT HEART CATHETERIZATION WITH CORONARY ANGIOGRAM;  Surgeon: Sinclair Grooms, MD;  Location: [facility] CATH LAB;  Service: Cardiovascular;  Laterality: N/A;  . TRANSVERSE LOOP COLOSTOMY N/A 07/03/2019   Procedure: TRANSVERSE LOOP COLOSTOMY;  Surgeon: Ronny Bacon, MD;  Location: [facility] ORS;  Service:  General;  Laterality: N/A;    Social History:  reports that he has never smoked. He has never used smokeless tobacco. He reports that he does not drink alcohol or use drugs.  Family History:  Family History  Problem Relation Age of Onset  . Breast cancer Mother   . Heart disease Father   . Heart attack Father   . Colon cancer Neg Hx      Prior to Admission medications   Medication Sig Start Date End Date Taking? Authorizing Provider  acetaminophen (TYLENOL) 325 MG tablet Take 1-2 tablets (325-650 mg total) by mouth every 4 (four) hours as needed for mild pain. 04/17/14   [name], NP  Albuterol Sulfate 108 (90 Base) MCG/ACT AEPB Inhale 1 puff into the lungs every 6 (six) hours as needed (shortness of breath).     [provider]  aspirin 81 MG tablet Take 81 mg by mouth daily.     [provider]  atorvastatin (LIPITOR) 80 MG tablet TAKE 1 TABLET BY MOUTH EVERY DAY Patient taking differently: Take 80 mg by mouth daily.  03/08/19  Minna Merritts, MD  balsalazide (COLAZAL) 750 MG capsule TAKE 1 CAPSULE (750 MG TOTAL) BY MOUTH 3 (THREE) TIMES DAILY. Patient taking differently: Take 750 mg by mouth 3 (three) times daily.  11/28/18   Ladene Artist, MD  collagenase (SANTYL) ointment Apply 1 application topically daily. (apply to sacrum)    [provider]  digoxin (LANOXIN) 0.125 MG tablet Take 1 tablet (0.125 mg total) by mouth daily. 07/10/19   [name], MD  finasteride (PROSCAR) 5 MG tablet Take 1 tablet (5 mg total) by mouth daily. 03/20/19   Loletha Grayer, MD  insulin aspart (NOVOLOG) 100 UNIT/ML injection Inject 5 Units into the skin 3 (three) times daily with meals. Patient taking differently: Inject 5 Units into the skin 3 (three) times daily before meals.  03/19/19   Loletha Grayer, MD  ipratropium-albuterol (DUONEB) 0.5-2.5 (3) MG/3ML SOLN Take 3 mLs by nebulization every 4 (four) hours as needed (shortness of breath or wheezing).    [provider]  lactobacillus (FLORANEX/LACTINEX) PACK Take 1 packet (1 g total) by mouth 3 (three) times daily with meals. 03/19/19   Loletha Grayer, MD  linezolid (ZYVOX) 600 MG tablet Take 1 tablet (600 mg total) by mouth 2 (two) times daily. 10/01/19   Leonel Ramsay, MD  midodrine (PROAMATINE) 10 MG tablet Take 1 tablet (10 mg total) by mouth 3 (three) times daily. 04/26/19   Max Sane, MD  Multiple Vitamins-Minerals (DECUBI-VITE PO) Take 1 capsule by mouth daily.     [provider]  niacin (NIASPAN) 1000 MG CR tablet TAKE 1 TABLET BY MOUTH EVERYDAY AT BEDTIME 10/03/19   Gollan, Kathlene November, MD  Nutritional Supplements (FEEDING SUPPLEMENT, NEPRO CARB STEADY,) LIQD Take 237 mLs by mouth 2 (two) times daily between meals. 03/19/19   Loletha Grayer, MD  omeprazole (PRILOSEC) 20 MG capsule Take 20 mg by mouth daily.     [provider]  ondansetron (ZOFRAN) 4 MG tablet Take 4 mg by mouth every 8 (eight) hours as needed for nausea or vomiting.    [provider]  piperacillin-tazobactam (ZOSYN) IVPB Inject 13.5 g into the vein continuous for 20 days. Please infuse 13.5gm piperacillin/tazobactam over 24 hours as continuous infusion Indication: pseudomonas bacteremia and sacral wound Last Day of Therapy: 10/21/2019 Labs - Once weekly:  CBC/D, CMP and CRP 10/02/19 10/22/19  Enzo Bi, MD  sertraline (ZOLOFT) 25 MG tablet Take 25 mg by mouth daily.    [provider]  tamsulosin (FLOMAX) 0.4 MG CAPS capsule Take 1 capsule (0.4 mg total) by mouth daily after breakfast. 03/20/19   Loletha Grayer, MD  vitamin C (VITAMIN C) 500 MG tablet Take 1 tablet (500 mg total) by mouth 2 (two) times daily. 07/09/19   [name], MD  XARELTO 20 MG TABS tablet TAKE 1 TABLET BY MOUTH EVERY DAY WITH LUNCH Patient taking differently: Take 20 mg by mouth daily with lunch.  05/17/19   Minna Merritts, MD    Physical Exam: Vitals:   10/11/19 1200 10/11/19 1300 10/11/19 1330 10/11/19  1559  BP: 132/79 133/79 133/89 108/66  Pulse:  84 78 84  Resp: 14 10 13 16   Temp:    (!) 97.5 F (36.4 C)  TempSrc:    Oral  SpO2:  100% 100% 100%  Weight:      Height:       General: Not in acute distress HEENT:       Eyes: PERRL, EOMI, no scleral icterus.  ENT: No discharge from the ears and nose, no pharynx injection, no tonsillar enlargement.        Neck: No JVD, no bruit, no mass felt. Heme: No neck lymph node enlargement. Cardiac: S1/S2, RRR, No murmurs, No gallops or rubs. Respiratory: No rales, wheezing, rhonchi or rubs. GI: Soft, nondistended, nontender, no rebound pain, no organomegaly, BS present. There is some redness noted around his colostomy bag GU: No hematuria Ext: No pitting leg edema bilaterally. 2+DP/PT pulse bilaterally. Musculoskeletal: No joint deformities, No joint redness or warmth, no limitation of ROM in spin. Skin:  Has sacral ulcer stage IV Neuro: Alert, oriented X3, cranial nerves II-XII grossly intact, moves all extremities normally. Psych: Patient is not psychotic, no suicidal or hemocidal ideation.  Labs on Admission: I have personally reviewed following labs and imaging studies  CBC: Recent Labs  Lab 10/11/19 0933  WBC 12.0*  NEUTROABS 9.9*  HGB 8.5*  HCT 27.8*  MCV 86.9  PLT 993*   Basic Metabolic Panel: Recent Labs  Lab 10/11/19 0933  NA 139  K 3.9  CL 107  CO2 16*  GLUCOSE 94  BUN 73*  CREATININE 4.64*  CALCIUM 7.9*   GFR: Estimated Creatinine Clearance: 19.1 mL/min (A) (by C-G formula based on SCr of 4.64 mg/dL (H)). Liver Function Tests: Recent Labs  Lab 10/11/19 0933  AST 19  ALT 15  ALKPHOS 88  BILITOT 0.9  PROT 6.2*  ALBUMIN 2.1*   No results for input(s): LIPASE, AMYLASE in the last 168 hours. No results for input(s): AMMONIA in the last 168 hours. Coagulation Profile: No results for input(s): INR, PROTIME in the last 168 hours. Cardiac Enzymes: No results for input(s): CKTOTAL, CKMB, CKMBINDEX,  TROPONINI in the last 168 hours. BNP (last 3 results) No results for input(s): PROBNP in the last 8760 hours. HbA1C: No results for input(s): HGBA1C in the last 72 hours. CBG: No results for input(s): GLUCAP in the last 168 hours. Lipid Profile: No results for input(s): CHOL, HDL, LDLCALC, TRIG, CHOLHDL, LDLDIRECT in the last 72 hours. Thyroid Function Tests: No results for input(s): TSH, T4TOTAL, FREET4, T3FREE, THYROIDAB in the last 72 hours. Anemia Panel: No results for input(s): VITAMINB12, FOLATE, FERRITIN, TIBC, IRON, RETICCTPCT in the last 72 hours. Urine analysis:    Component Value Date/Time   COLORURINE AMBER (A) 09/23/2019 1817   APPEARANCEUR TURBID (A) 09/23/2019 1817   LABSPEC 1.013 09/23/2019 1817   PHURINE 5.0 09/23/2019 1817   GLUCOSEU NEGATIVE 09/23/2019 1817   HGBUR NEGATIVE 09/23/2019 1817   BILIRUBINUR NEGATIVE 09/23/2019 1817   KETONESUR NEGATIVE 09/23/2019 1817   PROTEINUR NEGATIVE 09/23/2019 1817   UROBILINOGEN 1.0 11/27/2013 1017   NITRITE NEGATIVE 09/23/2019 1817   LEUKOCYTESUR LARGE (A) 09/23/2019 1817   Sepsis Labs: @LABRCNTIP (procalcitonin:4,lacticidven:4) ) Recent Results (from the past 240 hour(s))  C Difficile Quick Screen w PCR reflex     Status: None   Collection Time: 10/11/19  9:33 AM   Specimen: STOOL  Result Value Ref Range Status   C Diff antigen NEGATIVE NEGATIVE Final   C Diff toxin NEGATIVE NEGATIVE Final   C Diff interpretation No C. difficile detected.  Final    Comment: Performed at [facility], [address] [place], [place] [address]     Radiological Exams on Admission: US RENAL  Result Date: 10/11/2019 CLINICAL DATA:  Acute kidney injury EXAM: RENAL ULTRASOUND COMPARISON:  November 03, 2018 FINDINGS: Right Kidney: Renal measurements: 12.8 x 6.4 x 5.4 cm = volume: 234.4  mL . Echogenicity and renal cortical thickness are within normal limits. No mass, perinephric fluid, or hydronephrosis visualized. No sonographically  demonstrable calculus or ureterectasis. Left Kidney: Renal measurements: 13.4 x 5.2 x 6.5 cm = volume: 238.4 mL. Echogenicity and renal cortical thickness are within normal limits. No mass, perinephric fluid, or hydronephrosis visualized. No sonographically demonstrable calculus or ureterectasis. Bladder: Decompressed with Foley catheter and cannot be assessed. Other: None. IMPRESSION: Normal appearing kidneys bilaterally.  Urinary bladder decompressed. Electronically Signed   By: Lowella Grip III M.D.   On: 10/11/2019 15:23     EKG:   Not done in ED, will get one.   Assessment/Plan Principal Problem:   Acute renal failure (ARF) (HCC) Active Problems:   Hyperlipidemia   CAD S/P percutaneous coronary angioplasty - multiple PCIs   Atrial fibrillation (HCC)   GERD   CROHN'S DISEASE-LARGE & SMALL INTESTINE   Hypertension, essential   Chronic systolic CHF (congestive heart failure) (HCC)   Normocytic anemia   Chronic osteomyelitis (HCC)   Colostomy in place ([place])   Bacteremia due to Pseudomonas   Diarrhea   COVID-19 virus infection   Thrombocytopenia (HCC)   Sacral decubitus ulcer, stage IV ([place])   Addendum: due to AKI, medication dose was adjusted per pharmacist recommendation as follows: 1.  Switch her Xarelto to IV heparin for A. Fib 2.  Decrease digoxin dose dose to 0.0625 mg q48h 3. Pt is on continuous Zosyn at home, pharmacist will discuss with ID for dosing.   Acute renal failure (ARF) ([place]): Cre is up from 0.82 to 4.64, BUN 73. Likely due to prerenal secondary to dehydration. Dr. Holley Raring of renal is consulted.  -will admit to med-surg bed as inpt - IVF: 1L NS in ED, then 50 cc/h - Follow up renal function by BMP - Avoid using renal toxic medications, hypotension and contrast dye (or carefully use) - Check FeNa  - US-renal -->negative   Diarrhea: C diff pcr negative -f/u GI path panel -prn Imodium  Hyperlipidemia -Lipitor  CAD S/P percutaneous coronary  angioplasty - multiple PCIs: No CP -ASA and lipitro  Atrial fibrillation ([place]): HR 84 -continue Xarelto -->changed to IV heparin -digoxin  GERD: -protonix  CROHN'S DISEASE-LARGE & SMALL INTESTINE: -continue Balsalazide - Colostomy in place  Hypertension, essential: bp is 128/86. Not taking med -monitoring Bp   Chronic systolic CHF (congestive heart failure) ([place]): 2D echo on 09/28/2019 showed EF 25-30%.  Patient does not have leg edema.  BNP is elevated at 1655, but does not have obvious CHF exacerbation.  Due to AKI, will not start diuretics. -Watch volume status closely  Normocytic anemia: hgb stable. Hgb 8.5 on 10/01/19 -->8.5 today -f/u by CBC  Chronic osteomyelitis ([place]) and bacteremia due to Pseudomonas: no fever. WBC 12.0 -continue Zosyn and linezolid -repeat Bx  COVID-19 virus infection: -Patient is asymptomatic  Thrombocytopenia ([place]): Likely due to ongoing infection.  Platelet of 109 -check LDH and peripheral smear  Sacral decubitus ulcer, stage IV ([place]) -Consult wound care team     Inpatient status:  # Patient requires inpatient status due to high intensity of service, high risk for further deterioration and high frequency of surveillance required.  I certify that at the point of admission it is my clinical judgment that the patient will require inpatient hospital care spanning beyond 2 midnights from the point of admission.  . This patient has multiple chronic comorbidities including hypertension, hyperlipidemia, diabetes mellitus, GERD, depression, PAF on Xarelto, OSA, Crohn's disease, s/p of colostomy,  indwelling Foley catheter, sCHF with EF 25-30%, chronic osteomyelitis, recent admission due to Pseudomonas bacteremia, hypotension, covid 19 infection 09/23/19 . Now patient has presenting with diarrhea and AKI . The initial radiographic and laboratory data are worrisome because of AKI with Cre up from 0.82 to 4.64. BUN 73. . Current medical needs: please see my  assessment and plan . Predictability of an adverse outcome (risk): Patient has multiple comorbidities as listed above. Now presents with diarrhea and AKI.  Patient's presentation is highly complicated.  Patient is at high risk of deteriorating.  Will need to be treated in hospital for at least 2 days.        DVT ppx: on  Xarelto Code Status: Full code Family Communication: not done, no family member is at bed side.  Disposition Plan:  To be determined Consults called:  Renal, Dr. Holley Raring Admission status: Med-surg bed as inpt       Date of Service 10/11/2019    [facility]   If 7PM-7AM, please contact night-coverage [email or web address] 10/11/2019, 5:20 PM

## 2019-10-11 NOTE — ED Notes (Signed)
Pt transported to imaging.

## 2019-10-11 NOTE — ED Notes (Signed)
Attempted to call report to the floor but was told to call back once the RN was back on the floor

## 2019-10-11 NOTE — ED Triage Notes (Addendum)
Pt arrives via [facility] from home for reports of possibility of c-diff per home health nurse. Pt recently seen here a few times and has a colostomy bag. Pt states no concerns and feels well. A&Ox4 and in NAD. Pt has foley catheter that he states he has had since 2020, pt also has a colostomy with redness around the area and a PICC line hooked to a balloon  containing zosyn

## 2019-10-11 NOTE — Progress Notes (Signed)
Continuous Zosyn from home was stopped as per MD and Pharmacy.

## 2019-10-11 NOTE — Progress Notes (Signed)
Patient's HR 130, received call from tele, rhythm is AFIB. Digoxin has been discontinued. Notified on call provider B. Randol Kern NP. States she will look at the patient's information. Patient is asymptomatic.

## 2019-10-11 NOTE — ED Notes (Signed)
Spoke with pt's sister who reports that pt has not been eating and drinking much since he was last discharged from the hospital. Sister reports home health was going to "drop patient because he has too many health problems" and sister reports "I cant take care of him anymore"

## 2019-10-12 DIAGNOSIS — I4891 Unspecified atrial fibrillation: Secondary | ICD-10-CM

## 2019-10-12 DIAGNOSIS — A419 Sepsis, unspecified organism: Secondary | ICD-10-CM

## 2019-10-12 DIAGNOSIS — I5022 Chronic systolic (congestive) heart failure: Secondary | ICD-10-CM

## 2019-10-12 LAB — CBC
HCT: 26.1 % — ABNORMAL LOW (ref 39.0–52.0)
Hemoglobin: 8 g/dL — ABNORMAL LOW (ref 13.0–17.0)
MCH: 26.8 pg (ref 26.0–34.0)
MCHC: 30.7 g/dL (ref 30.0–36.0)
MCV: 87.3 fL (ref 80.0–100.0)
Platelets: 82 10*3/uL — ABNORMAL LOW (ref 150–400)
RBC: 2.99 MIL/uL — ABNORMAL LOW (ref 4.22–5.81)
RDW: 22.8 % — ABNORMAL HIGH (ref 11.5–15.5)
WBC: 10.3 10*3/uL (ref 4.0–10.5)
nRBC: 0 % (ref 0.0–0.2)

## 2019-10-12 LAB — URINALYSIS, ROUTINE W REFLEX MICROSCOPIC
Bacteria, UA: NONE SEEN
Bilirubin Urine: NEGATIVE
Glucose, UA: NEGATIVE mg/dL
Hgb urine dipstick: NEGATIVE
Ketones, ur: NEGATIVE mg/dL
Nitrite: NEGATIVE
Protein, ur: 100 mg/dL — AB
Specific Gravity, Urine: 1.011 (ref 1.005–1.030)
Squamous Epithelial / HPF: NONE SEEN (ref 0–5)
WBC, UA: >50 WBC/hpf — ABNORMAL HIGH (ref 0–5)
pH: 5 (ref 5.0–8.0)

## 2019-10-12 LAB — BASIC METABOLIC PANEL
Anion gap: 17 — ABNORMAL HIGH (ref 5–15)
Anion gap: 16 — ABNORMAL HIGH (ref 5–15)
BUN: 65 mg/dL — ABNORMAL HIGH (ref 8–23)
BUN: 67 mg/dL — ABNORMAL HIGH (ref 8–23)
CO2: 18 mmol/L — ABNORMAL LOW (ref 22–32)
CO2: 22 mmol/L (ref 22–32)
Calcium: 7.7 mg/dL — ABNORMAL LOW (ref 8.9–10.3)
Calcium: 7.9 mg/dL — ABNORMAL LOW (ref 8.9–10.3)
Chloride: 108 mmol/L (ref 98–111)
Chloride: 107 mmol/L (ref 98–111)
Creatinine, Ser: 4.3 mg/dL — ABNORMAL HIGH (ref 0.61–1.24)
Creatinine, Ser: 4.16 mg/dL — ABNORMAL HIGH (ref 0.61–1.24)
GFR calc Af Amer: 15 mL/min — ABNORMAL LOW (ref >60)
GFR calc Af Amer: 16 mL/min — ABNORMAL LOW (ref >60)
GFR calc non Af Amer: 13 mL/min — ABNORMAL LOW (ref >60)
GFR calc non Af Amer: 14 mL/min — ABNORMAL LOW (ref >60)
Glucose, Bld: 116 mg/dL — ABNORMAL HIGH (ref 70–99)
Glucose, Bld: 144 mg/dL — ABNORMAL HIGH (ref 70–99)
Potassium: 4.4 mmol/L (ref 3.5–5.1)
Potassium: 3.7 mmol/L (ref 3.5–5.1)
Sodium: 143 mmol/L (ref 135–145)
Sodium: 145 mmol/L (ref 135–145)

## 2019-10-12 LAB — MAGNESIUM: Magnesium: 1.8 mg/dL (ref 1.7–2.4)

## 2019-10-12 LAB — BASIC METABOLIC PANEL WITH GFR
Anion gap: 17 — ABNORMAL HIGH (ref 5–15)
BUN: 69 mg/dL — ABNORMAL HIGH (ref 8–23)
CO2: 18 mmol/L — ABNORMAL LOW (ref 22–32)
Calcium: 7.7 mg/dL — ABNORMAL LOW (ref 8.9–10.3)
Chloride: 107 mmol/L (ref 98–111)
Creatinine, Ser: 4.42 mg/dL — ABNORMAL HIGH (ref 0.61–1.24)
GFR calc Af Amer: 15 mL/min — ABNORMAL LOW
GFR calc non Af Amer: 13 mL/min — ABNORMAL LOW
Glucose, Bld: 90 mg/dL (ref 70–99)
Potassium: 3.6 mmol/L (ref 3.5–5.1)
Sodium: 142 mmol/L (ref 135–145)

## 2019-10-12 LAB — APTT: aPTT: >160 seconds (ref 24–36)

## 2019-10-12 LAB — PROTEIN / CREATININE RATIO, URINE
Creatinine, Urine: 43 mg/dL
Protein Creatinine Ratio: 2.58 mg/mg{Cre} — ABNORMAL HIGH (ref 0.00–0.15)
Total Protein, Urine: 111 mg/dL

## 2019-10-12 LAB — HEPARIN LEVEL (UNFRACTIONATED): Heparin Unfractionated: 1.3 IU/mL — ABNORMAL HIGH (ref 0.30–0.70)

## 2019-10-12 LAB — LACTIC ACID, PLASMA: Lactic Acid, Venous: 1.6 mmol/L (ref 0.5–1.9)

## 2019-10-12 LAB — SODIUM, URINE, RANDOM: Sodium, Ur: 66 mmol/L

## 2019-10-12 LAB — CREATININE, URINE, RANDOM: Creatinine, Urine: 43 mg/dL

## 2019-10-12 MED ORDER — PIPERACILLIN-TAZOBACTAM 3.375 G IVPB
3.3750 g | Freq: Two times a day (BID) | INTRAVENOUS | Status: DC
  Administered 2019-10-12 – 2019-10-14 (×6): 3.375 g via INTRAVENOUS
  Filled 2019-10-12 (×6): qty 50

## 2019-10-12 MED ORDER — DILTIAZEM LOAD VIA INFUSION: 5.0000 mg | Freq: Once | INTRAVENOUS | Status: DC

## 2019-10-12 MED ORDER — DILTIAZEM HCL 25 MG/5ML IV SOLN
5.0000 mg | Freq: Once | INTRAVENOUS | Status: AC
  Administered 2019-10-12: 5 mg via INTRAVENOUS
  Filled 2019-10-12: qty 5

## 2019-10-12 MED ORDER — HEPARIN (PORCINE) 25000 UT/250ML-% IV SOLN
1100.0000 [IU]/h | INTRAVENOUS | Status: DC
  Administered 2019-10-12: 1100 [IU]/h via INTRAVENOUS

## 2019-10-12 MED ORDER — NEPRO/CARBSTEADY PO LIQD
237.0000 mL | Freq: Three times a day (TID) | ORAL | Status: DC
  Administered 2019-10-12 – 2019-10-23 (×30): 237 mL via ORAL

## 2019-10-12 MED ORDER — DIGOXIN 125 MCG PO TABS
0.0625 mg | ORAL_TABLET | ORAL | Status: DC
  Administered 2019-10-14 – 2019-10-20 (×4): 0.0625 mg via ORAL
  Filled 2019-10-12 (×4): qty 0.5

## 2019-10-12 MED ORDER — DIGOXIN 0.25 MG/ML IJ SOLN
0.2500 mg | Freq: Once | INTRAMUSCULAR | Status: AC
  Administered 2019-10-12: 0.25 mg via INTRAVENOUS
  Filled 2019-10-12: qty 2

## 2019-10-12 MED ORDER — SODIUM BICARBONATE 8.4 % IV SOLN
INTRAVENOUS | Status: AC
  Filled 2019-10-12: qty 50

## 2019-10-12 MED ORDER — MIDODRINE HCL 5 MG PO TABS
5.0000 mg | ORAL_TABLET | Freq: Three times a day (TID) | ORAL | Status: DC
  Administered 2019-10-12 – 2019-10-13 (×3): 5 mg via ORAL
  Filled 2019-10-12 (×3): qty 1

## 2019-10-12 NOTE — Progress Notes (Addendum)
OVERNIGHT 69 y.o. male with medical history significant of hypertension, hyperlipidemia, diabetes mellitus, GERD, depression, PAF on Xarelto, (s/p atrial flutter ablation and internal cardiac defibrillator placement 2015) OSA, Crohn's disease, s/p of colostomy, indwelling Foley catheter, sCHF with EF 25-30%, chronic osteomyelitis, recent admission due to Pseudomonas bacteremia, hypotension, covid 19 infection 09/23/19, who presents with increased colostomy bag output,  Chart review with lab and clinical findings notable for acute kidney injury, dehydration sepsis, bacteremia (recent dx), chronic osteomyelitis  and now acute severe metabolic acidosis   Contacted by nurse around 2215  with reports patient in atrial fib with RVR 130's (patient on cardiac monitor, rhythm and rates confeimed with central cardiac monitoring tech)  and borderline low blood pressure.   Review of presenting symptoms, lab results, and recent history, great concern with metabolic acidosis and potential for lethal cardiac arrhythmia without intervention of that aacidosis. Also noted patient did not receive planned digoxin secondary to renal function (subtherapeutic level 0.3 ng/ml)   Metabolic acidosis AT 1771 on 10/11/2019 - Ordered  Patient to receive 2 amps sodium bicarb and low volume sodium bicarb infusion to prevent further arrhythmia development.  Due to suspected delay with bicarb, small volume LR bolus was also order.  Serial chemistry panels ordered.  Nephrology previously consulted for input on acute kidney injury  Sepsis With 3 SRS criteria given leukocytosis, tachycardia and low bp, along with recent bacteremia, repeat lactic acid obtained and was normal.  Blood cultures were previously collected in ED. Patient already  receiving antibiotics per ID.  No further fluid bolus given ans no hypoperfusion present (BP also stabilized). Chronic indwelling foley catheter not changed in ED on initial presentation.  Ordered change  due to sepsis concern but patient refused.  PICC line without overt infection concern but should be removed if signs of worsening sepsis/ shock develops or bacteremia persists  Atrial flutter with RVR Initial IV digoxin dose given at 0041 without change in heart rate or rhythm  Significant improvement in rate after sodium bicarb boluses administered and continuous bicarb infusion started at 0240 on 10/12/2019.  Pharmacy recommendations for digoxin dosing with additional load dose schedule 0700 then QOD oral dosing with repeat level to e obtained prior to fourth dose. Continue on heparin drip.

## 2019-10-12 NOTE — Progress Notes (Signed)
Dressing to sacrum changed cleaned with sterile water and ointment applied, covered with gauze and foam. Made dr. Jimmye Norman aware patient is on heparin drip and his level are high. Patient is actively bleeding from sacral wound

## 2019-10-12 NOTE — Progress Notes (Signed)
Per dr. Gwyndolyn Saxon give iv cardizem push 22m once. Will continue to monitor

## 2019-10-12 NOTE — Progress Notes (Signed)
Pt transferred from 1C.  A,Ox4.  Colostomy and foley bag emptied.  Precautions in place for covid and VRE in urine.  A-fib with heart rate ranging from 110-140/min.

## 2019-10-12 NOTE — Progress Notes (Signed)
Spoke with lisa the pharmacist to make aware ptt is greater than 160, heparin level is 1.3. per [name] hold heparin for an hour and she will place orders as needed.

## 2019-10-12 NOTE — Progress Notes (Addendum)
Cardiology Consultation:   Patient ID: Colton Lamb MRN: [id number]; DOB: 1950-12-27  Admit date: 10/11/2019 Date of Consult: 10/12/2019  Primary Care Provider: [name], MD Primary Cardiologist: Ida Rogue, MD  Primary Electrophysiologist:  None    Patient Profile:   Colton Lamb is a 69 y.o. 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 Jimmye Norman  History of Present Illness:   [name] is a 69 year old gentleman with hx of Acute on chronic combined systolic and diastolic CHF, last EF 25 to 30%s/p MDT ICD, coronary artery disease, multivessel, prior PCI, persistent atrial fibrillation, chronic hypotension, sacral decubitus ulcer with chronic osteomyelitis, s/p diverting colostomy who presents to the hospital due to increased colostomy bag output. Patient had a long hospital course with recent discharge. While at home, it was pretty difficult for his sister to take care of him which prompted EMS call and patient brought to the hospital.   He denies palpitations, chest pain, shortness of breath, fever, chills. Upon admission, he was noted to be in metabolic acidosis and started on bicarb drip. He also met sepsis criteria and was started on antibiotics. His blood pressures this admission have been on the low side with last reading 93/53. He was later on noted to be in atrial fibrillation with rapid ventricular response. There has been difficulty with giving rate controlling agents due to low blood pressures. His last creatinine was 4.3. He had normal creatinine 11 days ago.   Past Medical History:  Diagnosis Date  . Atrial  flutter ([place])    a. s/p Cardioversion 11/22/13, on amiodarone and Xarelto.  . CHF (congestive heart failure) ([employer])   . Chronic osteomyelitis ([place]) 06/30/2019   s/p colostomy  . Chronic systolic heart failure ([place])    a. 10/2013 EF 20-25%, grade III DD, RV mildly dilated and sys fx mild/mod reduced;  b. 01/2014 Echo: EF 30-35%, gr3 DD, mod dil LA. c. 07/2019 EF 20-25%  . Coronary artery disease    a. s/p MI 2007/2015;  b. s/p prior PCI to the LAD/LCX/PDA/PL;  c. 2008: s/p Cypher DES to the OM.  Colton Lamb Crohn's ileocolitis ([place])   . GERD (gastroesophageal reflux disease)   . Hx of adenomatous colonic polyps 11/2003  . Hyperlipidemia   . Hypertension   . Hypotension   . Ischemic cardiomyopathy    s. 01/2014 s/p MDT [id number] Colton Lamb XT DR single lead AICD.  Colton Lamb Obesity   . Paroxysmal atrial fibrillation (HCC)    a. CHA2DS2VASc = (CHF, HTN, agex1, DM)  . Sleep apnea   . Syncope    a.  11/2013 in setting of volume depletion and bradycardia due to dig toxicity   . Type II diabetes mellitus ([place])     Past Surgical History:  Procedure Laterality Date  . ATRIAL FLUTTER ABLATION N/A 04/16/2014   Procedure: ATRIAL FLUTTER ABLATION;  Surgeon: Lamb Lance, MD;  Location: [facility] CATH LAB;  Service: Cardiovascular;  Laterality: N/A;  . CARDIAC CATHETERIZATION  10/2013  . CARDIAC DEFIBRILLATOR PLACEMENT  04/16/2014   Medtronic Evira device  . CARDIAC ELECTROPHYSIOLOGY STUDY AND ABLATION  04/16/2014   atrial flutter ablation  . CARDIOVERSION N/A 03/05/2014   Procedure: CARDIOVERSION;  Surgeon: Colton Pascal  Bensimhon, MD;  Location: [place];  Service: Cardiovascular;  Laterality: N/A;  . CATARACT EXTRACTION W/PHACO Right 01/04/2017   Procedure: CATARACT EXTRACTION PHACO AND INTRAOCULAR LENS PLACEMENT ([place])  Right Diabetic Complicated;  Surgeon: Colton Koyanagi, MD;  Location: [place];  Service: Ophthalmology;  Laterality: Right;  Diabetic  . CATARACT EXTRACTION W/PHACO Left 02/08/2017   Procedure:  CATARACT EXTRACTION PHACO AND INTRAOCULAR LENS PLACEMENT ([place]) left diabetic;  Surgeon: Colton Koyanagi, MD;  Location: [place];  Service: Ophthalmology;  Laterality: Left;  Diabetic - oral meds sleep apnea  . CORONARY ANGIOPLASTY WITH STENT PLACEMENT  2007; 2008 X 2   "1+1 ~ 1"  . FOOT SURGERY Left    bone spur  . HYDROCELE EXCISION Bilateral   . 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 IMPLANTABLE CARDIOVERTER DEFIBRILLATOR IMPLANT N/A 04/16/2014   Procedure: IMPLANTABLE CARDIOVERTER DEFIBRILLATOR IMPLANT;  Surgeon: Lamb Lance, MD;  Location: [facility] CATH LAB;  Service: Cardiovascular;  Laterality: N/A;  . LEFT HEART CATHETERIZATION WITH CORONARY ANGIOGRAM N/A 11/22/2013   Procedure: LEFT HEART CATHETERIZATION WITH CORONARY ANGIOGRAM;  Surgeon: Colton Grooms, MD;  Location: [facility] CATH LAB;  Service: Cardiovascular;  Laterality: N/A;  . TRANSVERSE LOOP COLOSTOMY N/A 07/03/2019   Procedure: TRANSVERSE LOOP COLOSTOMY;  Surgeon: Colton Bacon, MD;  Location: [facility] ORS;  Service: General;  Laterality: N/A;     Home Medications:  Prior to Admission medications   Medication Sig Start Date End Date Taking? Authorizing Provider  acetaminophen (TYLENOL) 325 MG tablet Take 1-2 tablets (325-650 mg total) by mouth every 4 (four) hours as needed for mild pain. 04/17/14  Yes [name], NP  Albuterol Sulfate 108 (90 Base) MCG/ACT AEPB Inhale 1 puff into the lungs every 6 (six) hours as needed (shortness of breath).    Yes [provider]  aspirin 81 MG tablet Take 81 mg by mouth daily.    Yes [provider]  atorvastatin (LIPITOR) 80 MG tablet TAKE 1 TABLET BY MOUTH EVERY DAY Patient taking differently: Take 80 mg by mouth daily.  03/08/19  Yes Gollan, Kathlene November, MD  balsalazide (COLAZAL) 750 MG capsule TAKE 1 CAPSULE (750 MG TOTAL) BY MOUTH 3 (THREE) TIMES DAILY. Patient taking differently: Take 750 mg by mouth 3 (three) times daily.   11/28/18  Yes Ladene Artist, MD  digoxin (LANOXIN) 0.125 MG tablet Take 1 tablet (0.125 mg total) by mouth daily. 07/10/19  Yes [name], MD  finasteride (PROSCAR) 5 MG tablet Take 1 tablet (5 mg total) by mouth daily. 03/20/19  Yes Wieting, Richard, MD  insulin aspart (NOVOLOG) 100 UNIT/ML injection Inject 5 Units into the skin 3 (three) times daily with meals. Patient taking differently: Inject 5 Units into the skin 3 (three) times daily before meals.  03/19/19  Yes Wieting, Richard, MD  ipratropium-albuterol (DUONEB) 0.5-2.5 (3) MG/3ML SOLN Take 3 mLs by nebulization every 4 (four) hours as needed (shortness of breath or wheezing).   Yes [provider]  lactobacillus (FLORANEX/LACTINEX) PACK Take 1 packet (1 g total) by mouth 3 (three) times daily with meals. 03/19/19  Yes Wieting, Richard, MD  linezolid (ZYVOX) 600 MG tablet Take 1 tablet (600 mg total) by mouth 2 (two) times daily. 10/01/19  Yes Leonel Ramsay, MD  midodrine (PROAMATINE) 10 MG tablet Take 1 tablet (10 mg total) by mouth 3 (three) times daily. 04/26/19  Yes Max Sane, MD  omeprazole (PRILOSEC) 20 MG capsule Take 20 mg  by mouth daily.    Yes [provider]  ondansetron (ZOFRAN) 4 MG tablet Take 4 mg by mouth every 8 (eight) hours as needed for nausea or vomiting.   Yes [provider]  piperacillin-tazobactam (ZOSYN) IVPB Inject 13.5 g into the vein continuous for 20 days. Please infuse 13.5gm piperacillin/tazobactam over 24 hours as continuous infusion Indication: pseudomonas bacteremia and sacral wound Last Day of Therapy: 10/21/2019 Labs - Once weekly:  CBC/D, CMP and CRP 10/02/19 10/22/19 Yes Enzo Bi, MD  tamsulosin [facility]) 0.4 MG CAPS capsule Take 1 capsule (0.4 mg total) by mouth daily after breakfast. 03/20/19  Yes Wieting, Richard, MD  vitamin C (VITAMIN C) 500 MG tablet Take 1 tablet (500 mg total) by mouth 2 (two) times daily. 07/09/19  Yes [name], MD  XARELTO 20 MG TABS tablet TAKE 1  TABLET BY MOUTH EVERY DAY WITH LUNCH Patient taking differently: Take 20 mg by mouth daily with lunch.  05/17/19  Yes Minna Merritts, MD  collagenase (SANTYL) ointment Apply 1 application topically daily. (apply to sacrum)    [provider]  Multiple Vitamins-Minerals (DECUBI-VITE PO) Take 1 capsule by mouth daily.     [provider]  Nutritional Supplements (FEEDING SUPPLEMENT, NEPRO CARB STEADY,) LIQD Take 237 mLs by mouth 2 (two) times daily between meals. 03/19/19   Loletha Grayer, MD    Inpatient Medications: Scheduled Meds: . ascorbic acid  500 mg Oral BID  . atorvastatin  80 mg Oral Daily  . balsalazide  750 mg Oral TID  . Chlorhexidine Gluconate Cloth  6 each Topical Daily  . collagenase  1 application Topical Daily  . [START ON 10/14/2019] digoxin  0.0625 mg Oral Q48H  . feeding supplement (NEPRO CARB STEADY)  237 mL Oral TID BM  . finasteride  5 mg Oral Daily  . influenza vaccine adjuvanted  0.5 mL Intramuscular Tomorrow-1000  . lactobacillus  1 g Oral TID WC  . linezolid  600 mg Oral BID  . loperamide  2 mg Oral Q8H  . midodrine  2.5 mg Oral TID  . multivitamin with minerals  1 tablet Oral Daily  . pantoprazole  40 mg Oral Daily  . sodium bicarbonate      . tamsulosin  0.4 mg Oral QPC breakfast  . zinc sulfate  220 mg Oral Daily   Continuous Infusions: . heparin Stopped (10/12/19 1127)  . piperacillin-tazobactam (ZOSYN)  IV 12.5 mL/hr at 10/12/19 1236  .  sodium bicarbonate (isotonic) infusion in sterile water 100 mL/hr at 10/12/19 1311   PRN Meds: acetaminophen **OR** acetaminophen, ipratropium-albuterol, ondansetron (ZOFRAN) IV  Allergies:    Allergies  Allergen Reactions  . Iodine Other (See Comments)    Shortness of breath, swelling and hives  . Shrimp [Shellfish Allergy] Other (See Comments)    SWELLING    HIVES    SHORTNESS OF BREATH  . Tetracycline Rash    Social History:   Social History   Socioeconomic History  . Marital  status: Widowed    Spouse name: Not on file  . Number of children: 1  . Years of education: Not on file  . Highest education level: Not on file  Occupational History  . Occupation: retired    Fish farm manager: RETIRED  Tobacco Use  . Smoking status: Never Smoker  . Smokeless tobacco: Never Used  Substance and Sexual Activity  . Alcohol use: No  . Drug use: No  . Sexual activity: Never  Other Topics Concern  .  Not on file  Social History Narrative  . Not on file   Social Determinants of Health   Financial Resource Strain: Low Risk   . Difficulty of Paying Living Expenses: Not hard at all  Food Insecurity: No Food Insecurity  . Worried About Charity fundraiser in the Last Year: Never true  . Ran Out of Food in the Last Year: Never true  Transportation Needs: No Transportation Needs  . Lack of Transportation (Medical): No  . Lack of Transportation (Non-Medical): No  Physical Activity: Inactive  . Days of Exercise per Week: 0 days  . Minutes of Exercise per Session: 0 min  Stress:   . Feeling of Stress :   Social Connections: Moderately Isolated  . Frequency of Communication with Friends and Family: Never  . Frequency of Social Gatherings with Friends and Family: Never  . Attends Religious Services: Never  . Active Member of Clubs or Organizations: Yes  . Attends Archivist Meetings: 1 to 4 times per year  . Marital Status: Never married  Intimate Partner Violence: Unknown  . Fear of Current or Ex-Partner: No  . Emotionally Abused: Not on file  . Physically Abused: No  . Sexually Abused: No    Family History:    Family History  Problem Relation Age of Onset  . Breast cancer Mother   . Heart disease Father   . Heart attack Father   . Colon cancer Neg Hx      ROS:  Please see the history of present illness.   All other ROS reviewed and negative.     Physical Exam/Data:   Vitals:   10/12/19 0814 10/12/19 0829 10/12/19 0843 10/12/19 1233  BP: 103/67   92/64 (!) 93/53  Pulse:   (!) 109 (!) 101  Resp:    19  Temp:  97.9 F (36.6 C)  98.2 F (36.8 C)  TempSrc:    Oral  SpO2:  100%  99%  Weight:      Height:        Intake/Output Summary (Last 24 hours) at 10/12/2019 1422 Last data filed at 10/12/2019 1236 Gross per 24 hour  Intake 1284.51 ml  Output 2350 ml  Net -1065.49 ml   Last 3 Weights 10/12/2019 10/11/2019 10/09/2019  Weight (lbs) 183 lb 12.8 oz 238 lb 1.6 oz 240 lb  Weight (kg) 83.371 kg 108 kg 108.863 kg     Body mass index is 25.63 kg/m.  General:  Well nourished, well developed, in no acute distress HEENT: normal Lymph: no adenopathy Neck: no JVD Endocrine:  No thryomegaly Vascular: No carotid bruits; FA pulses 2+ bilaterally without bruits  Cardiac: Irregular irregular, no murmur noted Lungs: Decreased breath sounds at bases Abd: soft, nontender, Ext: no edema Musculoskeletal:  No deformities, BUE and BLE strength normal and equal Skin: warm and dry  Neuro:  CNs 2-12 intact, no focal abnormalities noted Psych:  Normal affect   EKG:  The EKG was personally reviewed and demonstrates: Atrial flutter 126 Telemetry:  Telemetry was personally reviewed and demonstrates: A. fib 120  Relevant CV Studies: TTE 09/2019 1. Left ventricular ejection fraction, by estimation, is 25 to 30%. The  left ventricle has severely decreased function. The left ventricle  demonstrates regional wall motion abnormalities , anterior , anterospetal  and apical wall hypokinesis.  2. Right ventricular systolic function is normal. The right ventricular  size is normal. There is mildly elevated pulmonary artery systolic  pressure.  3. Left atrial  size was mildly dilated.  4. No vegetation noted.   Laboratory Data:  High Sensitivity Troponin:  No results for input(s): TROPONINIHS in the last 720 hours.   Chemistry Recent Labs  Lab 31-Oct-2019 0933 10/12/19 0821 10/12/19 1216  NA 139 142 143  K 3.9 3.6 4.4  CL 107 107 108  CO2 16*  18* 18*  GLUCOSE 94 90 116*  BUN 73* 69* 65*  CREATININE 4.64* 4.42* 4.30*  CALCIUM 7.9* 7.7* 7.7*  GFRNONAA 12* 13* 13*  GFRAA 14* 15* 15*  ANIONGAP 16* 17* 17*    Recent Labs  Lab 10-31-2019 0933  PROT 6.2*  ALBUMIN 2.1*  AST 19  ALT 15  ALKPHOS 88  BILITOT 0.9   Hematology Recent Labs  Lab 10/31/2019 0933 10/12/19 0821  WBC 12.0* 10.3  RBC 3.20* 2.99*  HGB 8.5* 8.0*  HCT 27.8* 26.1*  MCV 86.9 87.3  MCH 26.6 26.8  MCHC 30.6 30.7  RDW 23.0* 22.8*  PLT 109* 82*   BNP Recent Labs  Lab 2019-10-31 0933  BNP 1,655.0*    DDimer No results for input(s): DDIMER in the last 168 hours.   Radiology/Studies:  US RENAL  Result Date: 10-31-19 CLINICAL DATA:  Acute kidney injury EXAM: RENAL ULTRASOUND COMPARISON:  November 03, 2018 FINDINGS: Right Kidney: Renal measurements: 12.8 x 6.4 x 5.4 cm = volume: 234.4 mL . Echogenicity and renal cortical thickness are within normal limits. No mass, perinephric fluid, or hydronephrosis visualized. No sonographically demonstrable calculus or ureterectasis. Left Kidney: Renal measurements: 13.4 x 5.2 x 6.5 cm = volume: 238.4 mL. Echogenicity and renal cortical thickness are within normal limits. No mass, perinephric fluid, or hydronephrosis visualized. No sonographically demonstrable calculus or ureterectasis. Bladder: Decompressed with Foley catheter and cannot be assessed. Other: None. IMPRESSION: Normal appearing kidneys bilaterally.  Urinary bladder decompressed. Electronically Signed   By: Lowella Grip III M.D.   On: 2019-10-31 15:23         Assessment and Plan:   1. Atrial Fibrillation with rapid ventricular response -Heart rates currently 101 -Digoxin being renally dosed -Continue digoxin at current dose -heparin as per pharm protocol -Check dig levels in the a.m. due to renal dysfunction -The patient's tachycardia is likely a physiologic response from infection/hypotension/sepsis. -Low blood pressures preventing the use of  beta-blockers, calcium channel blockers amiodarone. -Will increase midodrine to 5 mg 3 times daily to help with blood pressure. Hopefully with increased blood pressure, patient's tachycardia may be improved.  2. Heart failure reduced ejection fraction, last EF 25 to 30% status post ICD. -Low blood pressures preventing addition of heart failure therapy -Continue dig and midodrine as mentioned above for pressure support  3. Decubitus ulcer, sepsis, infection -abx as per primary team  4. Acute kidney injury -nephro following   Signed, Kate Sable, MD  10/12/2019 2:22 PM

## 2019-10-12 NOTE — Progress Notes (Signed)
Patient admitted for diarrhea w/ h/o afib on xarelto, currently held d/t AKI s/t diarrhea on heparin drip. Patient takes digoxin PTA w/ level showing 0.3 mcg/mL 03/12 @ 1800.  Dose of digoxin 0.0625 mg (62.5 mcg IV x 1) given for rate control since patient was having a HR of 120's in afib. NP asked to restart digoxin, recommended to do a half-load digoxin 312.5 mcg IV x 1 with: 62.5 mcg that was given earlier + 250 mcg IV x 1 at 03/13 at 0700 (6 hours apart).  Will then restart digoxin 0.0625 mg every other day (given CrCl 16.2 ml/min) and will check a level prior to 4th dose on Sunday 03/21 at 0500 w/ am labs. Patient in AKI w/o h/o CKD, K currently 3.9 will continue to monitor renal function and adjust digoxin dose per levels and/or if renal function improves.  Tobie Lords, PharmD, BCPS Clinical Pharmacist 10/12/2019

## 2019-10-12 NOTE — Progress Notes (Signed)
Converted from A-fib to NSR, heart rate 97/min.

## 2019-10-12 NOTE — Progress Notes (Signed)
Per dr. Jimmye Norman hold heparin drip for now and she will discontinue asa Will continue to montior

## 2019-10-12 NOTE — Consult Note (Signed)
Pharmacy Antibiotic Note  Colton Lamb is a 69 y.o. male admitted on 10/11/2019 with an infected sacral decubitus ulcer. Patient was admitted with acute renal failure with little improvement overnight. He was recently admitted 2/22-3/3 for sepsis secondary to Pseudomonas bacteremia and osteomyelitis. He was on outpatient antibiotics including pip/tazo 13.5 g over 24h as a continuous infusion (planned stop date 3/22) and linezolid 600 mg q12h (planned stop date 3/15). Pharmacy has been consulted for pip/tazo dosing. Linezolid has been resumed. His platelets are noted to be trending down  Plan:  Start Zosyn 3.375g EI every 12 hours  Height: 5' 11"  (180.3 cm) Weight: 183 lb 12.8 oz (83.4 kg)(Pt unable to stand ) IBW/kg (Calculated) : 75.3  Temp (24hrs), Avg:97.7 F (36.5 C), Min:97.4 F (36.3 C), Max:97.9 F (36.6 C)  Recent Labs  Lab 10/11/19 0933 10/11/19 2315 10/12/19 0208 10/12/19 0821  WBC 12.0*  --   --  10.3  CREATININE 4.64*  --   --  4.42*  LATICACIDVEN  --  1.5 1.6  --     Estimated Creatinine Clearance: 17 mL/min (A) (by C-G formula based on SCr of 4.42 mg/dL (H)).    Antimicrobials this admission: Linezolid 3/12 >>  Pip/tazo 3/13 >>   Microbiology results: 3/12 BCx: NGTD 3/12 GI panel: negative 3/12 C diff: negative 2/25 WCx: P mirabilis, P aeruginosa, E faecium 2/22 BCx: P aeruginosa (pan-sensitive)  Thank you for allowing pharmacy to be a part of this patient's care.  Dallie Piles  10/12/2019 10:28 AM

## 2019-10-12 NOTE — Progress Notes (Signed)
Made aware by ccmd patient converted back to afib heart rate 120-140. Upon entering room patient resting with eyes closed, patient easily aroused. Per patient no distress no discomfort. Will make md aware and will continue to monitor closely

## 2019-10-12 NOTE — Progress Notes (Signed)
Discussed reason for replacing foley catheter and pt refused to have foley catheter changed saying the home health nurse changed it last week and it's working fine.

## 2019-10-12 NOTE — Progress Notes (Addendum)
PROGRESS NOTE    Colton Lamb  XVQ:[id number] DOB: 1951/05/19 DOA: 10/11/2019 PCP: [name], MD       Assessment & Plan:   Principal Problem:   Acute renal failure (ARF) ([place]) Active Problems:   Hyperlipidemia   CAD S/P percutaneous coronary angioplasty - multiple PCIs   Atrial fibrillation (HCC)   GERD   CROHN'S DISEASE-LARGE & SMALL INTESTINE   Hypertension, essential   Chronic systolic CHF (congestive heart failure) (HCC)   Normocytic anemia   Chronic osteomyelitis ([place])   Colostomy in place [facility])   Bacteremia due to Pseudomonas   Diarrhea   COVID-19 virus infection   Thrombocytopenia (HCC)   Sacral decubitus ulcer, stage IV ([place])  Acute renal failure: Cr is trending down slightly. Baseline around 0.82. Likely due to prerenal secondary to dehydration. Nephro following and recs apprec. Continue on IVFs. Avoid nephrotoxics meds, hypotension  Diarrhea: c diff PCR negative. GI path panel pending.  Hyperlipidemia: continue on statin   CAD: s/p multiple PCIs. No CP. Continue w/ aspirin and lipitro  Atrial fibrillation: w/ RVR. PAF vs chronic. Hold xarelto secondary to AKI and started on IV heparin. IV heparin on hold secondary to bleeding stage IV sacral decubitus ulcer. Continue on digoxin.  S/p IV diltiazem x 1. Continue on tele. Cardio consulted   GERD: continue on PPI   Crohns Disease: continue balsalazide. S/p colostomy  Chronic systolic CHF: echo on 0/93/2671 showed EF 25-30%. BNP is elevated at 1655, but does not have obvious CHF exacerbation.  Due to AKI, will not start diuretics. Monitor volume status closely  Normocytic anemia: will monitor H&H. No need for a transfusion at this time   Chronic osteomyelitis & bacteremia: secondary to pseudomonas. Continue on zosyn & linezolid. Repeat blood cxs pending   COVID-19 virus infection: asymptomatic  Thrombocytopenia: Likely due to ongoing infection. Will continue to monitor   Sacral  decubitus ulcer, stage IV: present on admission. Bleeding from ulcer today as well, so hold heparin drip. Wound care consulted   Generalized weakness: will consult PT/OT when more appropriate as pt is a. fib w/ RVR.  Failure to thrive: lives at home alone. Cannot take care of himself at home   DVT prophylaxis: IV heparin  Code Status: full  Family Communication: discussed pt's care w/ pt's son, [name], and answered his questions Disposition Plan: needs to d/c to SNF vs LTAC, pt is unable to care for himself at home    Consultants:   nephro  cardio   Procedures:    Antimicrobials: zosyn, linezolid   Subjective: Pt c/o fatigue   Objective: Vitals:   10/12/19 0750 10/12/19 0812 10/12/19 0814 10/12/19 0829  BP: (!) 89/68 116/73 103/67   Pulse: (!) 131 (!) 129    Resp:      Temp: 97.6 F (36.4 C)   97.9 F (36.6 C)  TempSrc: Oral     SpO2: 100%   100%  Weight:      Height:        Intake/Output Summary (Last 24 hours) at 10/12/2019 0841 Last data filed at 10/12/2019 0500 Gross per 24 hour  Intake 0 ml  Output 2150 ml  Net -2150 ml   Filed Weights   10/11/19 0856 10/12/19 0200  Weight: 108 kg 83.4 kg    Examination:  General exam: Appears calm and comfortable  Respiratory system: Clear to auscultation. Respiratory effort normal. No rales  Cardiovascular system: irregularly irregular. No rubs, gallops or clicks Gastrointestinal system: Abdomen is  nondistended, soft and nontender.  Normal bowel sounds heard. Central nervous system: Alert and oriented. Psychiatry: Judgement and insight appear normal. Flat mood and affect    Data Reviewed: I have personally reviewed following labs and imaging studies  CBC: Recent Labs  Lab 10/11/19 0933  WBC 12.0*  NEUTROABS 9.9*  HGB 8.5*  HCT 27.8*  MCV 86.9  PLT 875*   Basic Metabolic Panel: Recent Labs  Lab 10/11/19 0933  NA 139  K 3.9  CL 107  CO2 16*  GLUCOSE 94  BUN 73*  CREATININE 4.64*  CALCIUM 7.9*     GFR: Estimated Creatinine Clearance: 16.2 mL/min (A) (by C-G formula based on SCr of 4.64 mg/dL (H)). Liver Function Tests: Recent Labs  Lab 10/11/19 0933  AST 19  ALT 15  ALKPHOS 88  BILITOT 0.9  PROT 6.2*  ALBUMIN 2.1*   No results for input(s): LIPASE, AMYLASE in the last 168 hours. No results for input(s): AMMONIA in the last 168 hours. Coagulation Profile: Recent Labs  Lab 10/11/19 2107  INR 1.4*   Cardiac Enzymes: No results for input(s): CKTOTAL, CKMB, CKMBINDEX, TROPONINI in the last 168 hours. BNP (last 3 results) No results for input(s): PROBNP in the last 8760 hours. HbA1C: No results for input(s): HGBA1C in the last 72 hours. CBG: No results for input(s): GLUCAP in the last 168 hours. Lipid Profile: No results for input(s): CHOL, HDL, LDLCALC, TRIG, CHOLHDL, LDLDIRECT in the last 72 hours. Thyroid Function Tests: No results for input(s): TSH, T4TOTAL, FREET4, T3FREE, THYROIDAB in the last 72 hours. Anemia Panel: No results for input(s): VITAMINB12, FOLATE, FERRITIN, TIBC, IRON, RETICCTPCT in the last 72 hours. Sepsis Labs: Recent Labs  Lab 10/11/19 2315 10/12/19 0208  LATICACIDVEN 1.5 1.6    Recent Results (from the past 240 hour(s))  C Difficile Quick Screen w PCR reflex     Status: None   Collection Time: 10/11/19  9:33 AM   Specimen: STOOL  Result Value Ref Range Status   C Diff antigen NEGATIVE NEGATIVE Final   C Diff toxin NEGATIVE NEGATIVE Final   C Diff interpretation No C. difficile detected.  Final    Comment: Performed at [facility], [address] [place], [place] [address]  Culture, blood (Routine X 2) w Reflex to ID Panel     Status: None (Preliminary result)   Collection Time: 10/11/19  4:34 PM   Specimen: BLOOD RIGHT HAND  Result Value Ref Range Status   Specimen Description BLOOD RIGHT HAND  Final   Special Requests   Final    BOTTLES DRAWN AEROBIC ONLY Blood Culture results may not be optimal due to an inadequate  volume of blood received in culture bottles   Culture   Final    NO GROWTH < 24 HOURS Performed at [facility], [address] [place], [place] [address]    Report Status PENDING  Incomplete         Radiology Studies: US RENAL  Result Date: 10/11/2019 CLINICAL DATA:  Acute kidney injury EXAM: RENAL ULTRASOUND COMPARISON:  November 03, 2018 FINDINGS: Right Kidney: Renal measurements: 12.8 x 6.4 x 5.4 cm = volume: 234.4 mL . Echogenicity and renal cortical thickness are within normal limits. No mass, perinephric fluid, or hydronephrosis visualized. No sonographically demonstrable calculus or ureterectasis. Left Kidney: Renal measurements: 13.4 x 5.2 x 6.5 cm = volume: 238.4 mL. Echogenicity and renal cortical thickness are within normal limits. No mass, perinephric fluid, or hydronephrosis visualized. No sonographically  demonstrable calculus or ureterectasis. Bladder: Decompressed with Foley catheter and cannot be assessed. Other: None. IMPRESSION: Normal appearing kidneys bilaterally.  Urinary bladder decompressed. Electronically Signed   By: Lowella Grip III M.D.   On: 10/11/2019 15:23        Scheduled Meds: . ascorbic acid  500 mg Oral BID  . aspirin EC  81 mg Oral Daily  . atorvastatin  80 mg Oral Daily  . balsalazide  750 mg Oral TID  . Chlorhexidine Gluconate Cloth  6 each Topical Daily  . collagenase  1 application Topical Daily  . [START ON 10/14/2019] digoxin  0.0625 mg Oral Q48H  . diltiazem  5 mg Intravenous Once  . feeding supplement (NEPRO CARB STEADY)  237 mL Oral BID BM  . finasteride  5 mg Oral Daily  . influenza vaccine adjuvanted  0.5 mL Intramuscular Tomorrow-1000  . lactobacillus  1 g Oral TID WC  . linezolid  600 mg Oral BID  . loperamide  2 mg Oral Q8H  . midodrine  2.5 mg Oral TID  . multivitamin with minerals  1 tablet Oral Daily  . pantoprazole  40 mg Oral Daily  . sodium bicarbonate      . tamsulosin  0.4 mg Oral QPC breakfast  . zinc  sulfate  220 mg Oral Daily   Continuous Infusions: . heparin 1,300 Units/hr (10/11/19 2329)  .  sodium bicarbonate (isotonic) infusion in sterile water 100 mL/hr at 10/12/19 0240     LOS: 1 day    Time spent: 33 mins     Wyvonnia Dusky, MD [facility] Pager [phone]  If 7PM-7AM, please contact night-coverage [email or web address] 10/12/2019, 8:41 AM

## 2019-10-12 NOTE — Progress Notes (Signed)
Dr. Everett Graff returned page. bp improved sbp 103-116 per md placed order for iv cardizem due to heart ranging 120-130. Will continue to monitor closely

## 2019-10-12 NOTE — Consult Note (Signed)
[place] for Heparin Infusion Indication: atrial fibrillation  Allergies  Allergen Reactions  . Iodine Other (See Comments)    Shortness of breath, swelling and hives  . Shrimp [Shellfish Allergy] Other (See Comments)    SWELLING    HIVES    SHORTNESS OF BREATH  . Tetracycline Rash    Patient Measurements: Height: 5' 11"  (180.3 cm) Weight: 183 lb 12.8 oz (83.4 kg)(Pt unable to stand ) IBW/kg (Calculated) : 75.3 Heparin Dosing Weight: 98.3 kg  Vital Signs: Temp: 97.9 F (36.6 C) (03/13 0829) Temp Source: Oral (03/13 0750) BP: 92/64 (03/13 0843) Pulse Rate: 109 (03/13 0843)  Labs: Recent Labs    10/11/19 0933 10/11/19 2107 10/12/19 0821  HGB 8.5*  --  8.0*  HCT 27.8*  --  26.1*  PLT 109*  --  82*  APTT  --  41* >160*  LABPROT  --  16.6*  --   INR  --  1.4*  --   HEPARINUNFRC  --  1.42* 1.30*  CREATININE 4.64*  --  4.42*    Estimated Creatinine Clearance: 17 mL/min (A) (by C-G formula based on SCr of 4.42 mg/dL (H)).   Medical History: Past Medical History:  Diagnosis Date  . Atrial flutter ([place])    a. s/p Cardioversion 11/22/13, on amiodarone and Xarelto.  . CHF (congestive heart failure) ([place])   . Chronic osteomyelitis ([place]) 06/30/2019   s/p colostomy  . Chronic systolic heart failure ([place])    a. 10/2013 EF 20-25%, grade III DD, RV mildly dilated and sys fx mild/mod reduced;  b. 01/2014 Echo: EF 30-35%, gr3 DD, mod dil LA. c. 07/2019 EF 20-25%  . Coronary artery disease    a. s/p MI 2007/2015;  b. s/p prior PCI to the LAD/LCX/PDA/PL;  c. 2008: s/p Cypher DES to the OM.  Marland Kitchen Crohn's ileocolitis ([place])   . GERD (gastroesophageal reflux disease)   . Hx of adenomatous colonic polyps 11/2003  . Hyperlipidemia   . Hypertension   . Hypotension   . Ischemic cardiomyopathy    s. 01/2014 s/p MDT [id number] [name] XT DR single lead AICD.  Marland Kitchen Obesity   . Paroxysmal atrial fibrillation (HCC)    a. CHA2DS2VASc = (CHF, HTN, agex1, DM)  .  Sleep apnea   . Syncope    a.  11/2013 in setting of volume depletion and bradycardia due to dig toxicity   . Type II diabetes mellitus (HCC)     Medications:  Rivaroxaban 20 mg daily (Patient reported last dose was 3/11)  Assessment: Patient is a 69 y/o M with a history of atrial fibrillation on rivaroxaban who is admitted with acute renal failure. Rivaroxaban on hold due to renal function. Pharmacy has been consulted to start heparin infusion.   Baseline CBC significant for a Hgb of 8.5 (appears c/w baseline), and platelets 109 (baseline is lower limit of normal). Baseline coags elevated with INR 1.4, aPTT 41, heparin level 1.42.   -IV heparin at 1300 units/hr to start at least 24h after last dose of rivaroxaban -aPTT/HL tomorrow morning. Follow aPTTs for now in view of rivaroxaban interference with heparin level  Goal of Therapy:  aPTT 66-102 seconds Monitor platelets by anticoagulation protocol: Yes   Plan:  3/13 @ 0821 aPTT >160, HL 1.30. Will hold heparin drip x 1 hour and restart at 1100 units/hr. Recheck aPTT 8 hrs after restart. -Daily CBC per protocol  Chinita Greenland PharmD Clinical Pharmacist 10/12/2019

## 2019-10-12 NOTE — Progress Notes (Signed)
[facility]  ROUNDING NOTE   Subjective:   Mr. RAYNALDO FALCO admitted to [facility] on 10/11/2019 for Diarrhea of infectious origin [A09] AKI (acute kidney injury) ([place]) [N17.9]  Found to have atrial fibrillation and placed on heparin gtt. However now with sacral decubitus bleeding.   Objective:  Vital signs in last 24 hours:  Temp:  [97.4 F (36.3 C)-98.2 F (36.8 C)] 98.2 F (36.8 C) (03/13 1233) Pulse Rate:  [84-131] 101 (03/13 1233) Resp:  [16-19] 19 (03/13 1233) BP: (89-116)/(53-79) 93/53 (03/13 1233) SpO2:  [99 %-100 %] 99 % (03/13 1233) Weight:  [83.4 kg] 83.4 kg (03/13 0200)  Weight change:  Filed Weights   10/11/19 0856 10/12/19 0200  Weight: 108 kg 83.4 kg    Intake/Output: I/O last 3 completed shifts: In: 0  Out: 2150 [Urine:1900; Stool:250]   Intake/Output this shift:  Total I/O In: 1284.5 [P.O.:220; I.V.:1052.3; IV Piggyback:12.2] Out: 200 [Stool:200]  Physical Exam: General: NAD,   Head: Normocephalic, atraumatic. Moist oral mucosal membranes  Eyes: Anicteric, PERRL  Neck: Supple, trachea midline  Lungs:  Clear to auscultation  Heart: Regular rate and rhythm  Abdomen:  Soft, nontender,   Extremities:  no peripheral edema.  Neurologic: Nonfocal, moving all four extremities           Basic Metabolic Panel: Recent Labs  Lab 10/11/19 0933 10/12/19 0821 10/12/19 1216  NA 139 142 143  K 3.9 3.6 4.4  CL 107 107 108  CO2 16* 18* 18*  GLUCOSE 94 90 116*  BUN 73* 69* 65*  CREATININE 4.64* 4.42* 4.30*  CALCIUM 7.9* 7.7* 7.7*  MG  --  1.8  --     Liver Function Tests: Recent Labs  Lab 10/11/19 0933  AST 19  ALT 15  ALKPHOS 88  BILITOT 0.9  PROT 6.2*  ALBUMIN 2.1*   No results for input(s): LIPASE, AMYLASE in the last 168 hours. No results for input(s): AMMONIA in the last 168 hours.  CBC: Recent Labs  Lab 10/11/19 0933 10/12/19 0821  WBC 12.0* 10.3  NEUTROABS 9.9*  --   HGB 8.5* 8.0*  HCT 27.8* 26.1*  MCV 86.9  87.3  PLT 109* 82*    Cardiac Enzymes: No results for input(s): CKTOTAL, CKMB, CKMBINDEX, TROPONINI in the last 168 hours.  BNP: Invalid input(s): POCBNP  CBG: No results for input(s): GLUCAP in the last 168 hours.  Microbiology: Results for orders placed or performed during the hospital encounter of 10/11/19  C Difficile Quick Screen w PCR reflex     Status: None   Collection Time: 10/11/19  9:33 AM   Specimen: STOOL  Result Value Ref Range Status   C Diff antigen NEGATIVE NEGATIVE Final   C Diff toxin NEGATIVE NEGATIVE Final   C Diff interpretation No C. difficile detected.  Final    Comment: Performed at [facility], [address] [place], [place] [address]  Culture, blood (Routine X 2) w Reflex to ID Panel     Status: None (Preliminary result)   Collection Time: 10/11/19  4:34 PM   Specimen: BLOOD RIGHT HAND  Result Value Ref Range Status   Specimen Description BLOOD RIGHT HAND  Final   Special Requests   Final    BOTTLES DRAWN AEROBIC ONLY Blood Culture results may not be optimal due to an inadequate volume of blood received in culture bottles   Culture   Final    NO GROWTH < 24 HOURS Performed at [facility],  [place], [place] [address]    Report Status PENDING  Incomplete    Coagulation Studies: Recent Labs    10/11/19 10-05-05  LABPROT 16.6*  INR 1.4*    Urinalysis: Recent Labs    10/12/19 1145  COLORURINE YELLOW*  LABSPEC 1.011  PHURINE 5.0  GLUCOSEU NEGATIVE  HGBUR NEGATIVE  BILIRUBINUR NEGATIVE  KETONESUR NEGATIVE  PROTEINUR 100*  NITRITE NEGATIVE  LEUKOCYTESUR LARGE*      Imaging: US RENAL  Result Date: 10/11/2019 CLINICAL DATA:  Acute kidney injury EXAM: RENAL ULTRASOUND COMPARISON:  November 03, 2018 FINDINGS: Right Kidney: Renal measurements: 12.8 x 6.4 x 5.4 cm = volume: 234.4 mL . Echogenicity and renal cortical thickness are within normal limits. No mass, perinephric fluid, or hydronephrosis visualized.  No sonographically demonstrable calculus or ureterectasis. Left Kidney: Renal measurements: 13.4 x 5.2 x 6.5 cm = volume: 238.4 mL. Echogenicity and renal cortical thickness are within normal limits. No mass, perinephric fluid, or hydronephrosis visualized. No sonographically demonstrable calculus or ureterectasis. Bladder: Decompressed with Foley catheter and cannot be assessed. Other: None. IMPRESSION: Normal appearing kidneys bilaterally.  Urinary bladder decompressed. Electronically Signed   By: Lowella Grip III M.D.   On: 10/11/2019 15:23     Medications:   . heparin Stopped (10/12/19 1127)  . piperacillin-tazobactam (ZOSYN)  IV 12.5 mL/hr at 10/12/19 1236  .  sodium bicarbonate (isotonic) infusion in sterile water 100 mL/hr at 10/12/19 1311   . ascorbic acid  500 mg Oral BID  . atorvastatin  80 mg Oral Daily  . balsalazide  750 mg Oral TID  . Chlorhexidine Gluconate Cloth  6 each Topical Daily  . collagenase  1 application Topical Daily  . [START ON 10/14/2019] digoxin  0.0625 mg Oral Q48H  . feeding supplement (NEPRO CARB STEADY)  237 mL Oral TID BM  . finasteride  5 mg Oral Daily  . influenza vaccine adjuvanted  0.5 mL Intramuscular Tomorrow-1000  . lactobacillus  1 g Oral TID WC  . linezolid  600 mg Oral BID  . loperamide  2 mg Oral Q8H  . midodrine  2.5 mg Oral TID  . multivitamin with minerals  1 tablet Oral Daily  . pantoprazole  40 mg Oral Daily  . sodium bicarbonate      . tamsulosin  0.4 mg Oral QPC breakfast  . zinc sulfate  220 mg Oral Daily   acetaminophen **OR** acetaminophen, ipratropium-albuterol, ondansetron (ZOFRAN) IV  Assessment/ Plan:  Mr. ROXIE GUEYE is a 69 y.o. white male  with diabetes mellitus type II, hypertension, obstructive sleep apnea, congestive heart failure, atrial fibrillation, hyperlipidemia, Crohn's disease, coronary artery disease, GERD, pacemaker,who was admitted to [facility] on 10/11/2019 for Diarrhea of infectious origin [A09] AKI  (acute kidney injury) ([place]) [N17.9]   1. Acute renal failure with metabolic acidosis: with baseline creatinine of 0.82, GFR > 60 on 10/01/19.  with underlying diabetic nephropathy with glucosuria and proteinuria.  Secondary to diarrhea and prerenal azotemia.  Renal ultrasound negative. No IV contrast exposure.  Nonoliguric urine output. No indication for dialysis.   - Reduce IV fluids to bicarb 49m/hr  2. Hypertension: holding blood pressure medications.   3. Anemia with renal failure: hemoglobin 8. Normocytic. With bleeding from sacral wound.      LOS: 1 Persis Graffius 3/13/20212:23 PM

## 2019-10-12 NOTE — Progress Notes (Signed)
Spoke with dr. Garen Lah regarding plan of care for patient. Made him aware heart rate ranging 110-120. Occasional 130. Per md he increased midodrine and started elevated heart rate is due to hypotension and infections. Will continue to monitor closely.

## 2019-10-12 NOTE — Care Management (Signed)
PT/OT evaluation requested.

## 2019-10-12 NOTE — Consult Note (Signed)
[place] for Heparin Infusion Indication: atrial fibrillation  Allergies  Allergen Reactions  . Iodine Other (See Comments)    Shortness of breath, swelling and hives  . Shrimp [Shellfish Allergy] Other (See Comments)    SWELLING    HIVES    SHORTNESS OF BREATH  . Tetracycline Rash    Patient Measurements: Height: 5' 11"  (180.3 cm) Weight: 183 lb 12.8 oz (83.4 kg)(Pt unable to stand ) IBW/kg (Calculated) : 75.3 Heparin Dosing Weight: 98.3 kg  Vital Signs: Temp: 98.1 F (36.7 C) (03/13 1617) Temp Source: Oral (03/13 1617) BP: 95/70 (03/13 1617) Pulse Rate: 112 (03/13 1617)  Labs: Recent Labs    10/11/19 0933 10/11/19 2107 10/12/19 0821 10/12/19 1216  HGB 8.5*  --  8.0*  --   HCT 27.8*  --  26.1*  --   PLT 109*  --  82*  --   APTT  --  41* >160*  --   LABPROT  --  16.6*  --   --   INR  --  1.4*  --   --   HEPARINUNFRC  --  1.42* 1.30*  --   CREATININE 4.64*  --  4.42* 4.30*    Estimated Creatinine Clearance: 17.5 mL/min (A) (by C-G formula based on SCr of 4.3 mg/dL (H)).   Medical History: Past Medical History:  Diagnosis Date  . Atrial flutter ([place])    a. s/p Cardioversion 11/22/13, on amiodarone and Xarelto.  . CHF (congestive heart failure) ([place])   . Chronic osteomyelitis ([place]) 06/30/2019   s/p colostomy  . Chronic systolic heart failure ([place])    a. 10/2013 EF 20-25%, grade III DD, RV mildly dilated and sys fx mild/mod reduced;  b. 01/2014 Echo: EF 30-35%, gr3 DD, mod dil LA. c. 07/2019 EF 20-25%  . Coronary artery disease    a. s/p MI 2007/2015;  b. s/p prior PCI to the LAD/LCX/PDA/PL;  c. 2008: s/p Cypher DES to the OM.  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 GERD (gastroesophageal reflux disease)   . Hx of adenomatous colonic polyps 11/2003  . Hyperlipidemia   . Hypertension   . Hypotension   . Ischemic cardiomyopathy    s. 01/2014 s/p MDT [id number] [name] XT DR single lead AICD.  Marland Kitchen Obesity   . Paroxysmal atrial  fibrillation (HCC)    a. CHA2DS2VASc = (CHF, HTN, agex1, DM)  . Sleep apnea   . Syncope    a.  11/2013 in setting of volume depletion and bradycardia due to dig toxicity   . Type II diabetes mellitus (HCC)     Medications:  Rivaroxaban 20 mg daily (Patient reported last dose was 3/11)  Assessment: Patient is a 69 y/o M with a history of atrial fibrillation on rivaroxaban who is admitted with acute renal failure. Rivaroxaban on hold due to renal function. Pharmacy has been consulted to start heparin infusion.   Baseline CBC significant for a Hgb of 8.5 (appears c/w baseline), and platelets 109 (baseline is lower limit of normal). Baseline coags elevated with INR 1.4, aPTT 41, heparin level 1.42.   -IV heparin at 1300 units/hr to start at least 24h after last dose of rivaroxaban -Follow aPTTs for now in view of rivaroxaban interference with heparin level  3/13 @ 0821 aPTT >160, HL 1.30. Will hold heparin drip x 1 hour and restart at 1100 units/hr.  Goal of Therapy:  aPTT 66-102 seconds Monitor platelets by anticoagulation protocol: Yes   Plan:  Per nurse(Crystal), patient's sacral wound continues to actively bleed and Dr. Jimmye Norman has instructed her to hold heparin infusion until the morning and will reassess. Drip has been stopped since 1127 this morning. Hemoglobin has also dropped.  -Follow up with MD in the morning on potential restart. -Daily CBC per protocol  Pearla Dubonnet, PharmD Clinical Pharmacist 10/12/2019 6:51 PM

## 2019-10-12 NOTE — Progress Notes (Signed)
Initial Nutrition Assessment RD working remotely.  DOCUMENTATION CODES:   Not applicable  INTERVENTION:   Continue Nepro Shake, increase to TID.  Continue MVI daily.  NUTRITION DIAGNOSIS:   Increased nutrient needs related to wound healing as evidenced by estimated needs.  GOAL:   Patient will meet greater than or equal to 90% of their needs  MONITOR:   PO intake, Supplement acceptance, Skin, Labs  REASON FOR ASSESSMENT:   Consult Assessment of nutrition requirement/status  ASSESSMENT:   69 yo male admitted with infected sacral decubitus ulcer, acute severe metabolic acidosis, increased colostomy output, AKI. PMH includes COVID-19 infection 09/23/19, HTN, HLD, DM, GERD, depression, PAF, OSA, Crohn's disease, colostomy, CHF, chronic osteomyelitis.   Called patient's room, but no answer.  Patient is currently on a heart healthy diet. He consumed 50% of breakfast today.   Nepro Shake has been ordered BID. Patient was given one this morning, unsure if he drank it.   200 ml colostomy output this morning.  Labs reviewed. BUN 69 (H), creat 4.42 (H)  Medications reviewed and include vitamin C, lactobacillus, imodium, MVI, protonix, flomax, zinc, sodium bicarb in IVF.   From review of usual weights, patient has lost 25% of usual weight within the past 3 months which is significant for the time frame. Suspect intake has been poor since COVID diagnosis in February.   NUTRITION - FOCUSED PHYSICAL EXAM:  unable to complete  Diet Order:   Diet Order            Diet heart healthy/carb modified Room service appropriate? Yes; Fluid consistency: Thin  Diet effective now              EDUCATION NEEDS:   Not appropriate for education at this time  Skin:  Skin Assessment: Skin Integrity Issues: Skin Integrity Issues:: DTI, Stage II, Stage IV DTI: buttocks Stage II: back, L thigh, L buttocks Stage IV: sacrum   Last BM:  3/13 colostomy  Height:   Ht Readings from Last  1 Encounters:  10/11/19 5' 11"  (1.803 m)    Weight:   Wt Readings from Last 1 Encounters:  10/12/19 83.4 kg    Ideal Body Weight:  78.2 kg  BMI:  Body mass index is 25.63 kg/m.  Estimated Nutritional Needs:   Kcal:  2100-2300  Protein:  120-140 gm  Fluid:  2-2.2 L    Molli Barrows, RD, LDN, CNSC Please refer to Amion for contact information.

## 2019-10-13 DIAGNOSIS — I4821 Permanent atrial fibrillation: Secondary | ICD-10-CM

## 2019-10-13 LAB — BASIC METABOLIC PANEL
Anion gap: 12 (ref 5–15)
Anion gap: 14 (ref 5–15)
Anion gap: 15 (ref 5–15)
Anion gap: 16 — ABNORMAL HIGH (ref 5–15)
BUN: 59 mg/dL — ABNORMAL HIGH (ref 8–23)
BUN: 61 mg/dL — ABNORMAL HIGH (ref 8–23)
BUN: 62 mg/dL — ABNORMAL HIGH (ref 8–23)
BUN: 65 mg/dL — ABNORMAL HIGH (ref 8–23)
CO2: 21 mmol/L — ABNORMAL LOW (ref 22–32)
CO2: 22 mmol/L (ref 22–32)
CO2: 24 mmol/L (ref 22–32)
CO2: 26 mmol/L (ref 22–32)
Calcium: 7.6 mg/dL — ABNORMAL LOW (ref 8.9–10.3)
Calcium: 7.7 mg/dL — ABNORMAL LOW (ref 8.9–10.3)
Calcium: 7.7 mg/dL — ABNORMAL LOW (ref 8.9–10.3)
Calcium: 7.8 mg/dL — ABNORMAL LOW (ref 8.9–10.3)
Chloride: 104 mmol/L (ref 98–111)
Chloride: 104 mmol/L (ref 98–111)
Chloride: 104 mmol/L (ref 98–111)
Chloride: 105 mmol/L (ref 98–111)
Creatinine, Ser: 3.83 mg/dL — ABNORMAL HIGH (ref 0.61–1.24)
Creatinine, Ser: 3.91 mg/dL — ABNORMAL HIGH (ref 0.61–1.24)
Creatinine, Ser: 3.99 mg/dL — ABNORMAL HIGH (ref 0.61–1.24)
Creatinine, Ser: 4.17 mg/dL — ABNORMAL HIGH (ref 0.61–1.24)
GFR calc Af Amer: 16 mL/min — ABNORMAL LOW (ref >60)
GFR calc Af Amer: 17 mL/min — ABNORMAL LOW (ref >60)
GFR calc Af Amer: 17 mL/min — ABNORMAL LOW (ref >60)
GFR calc Af Amer: 18 mL/min — ABNORMAL LOW (ref >60)
GFR calc non Af Amer: 14 mL/min — ABNORMAL LOW (ref >60)
GFR calc non Af Amer: 14 mL/min — ABNORMAL LOW (ref >60)
GFR calc non Af Amer: 15 mL/min — ABNORMAL LOW (ref >60)
GFR calc non Af Amer: 15 mL/min — ABNORMAL LOW (ref >60)
Glucose, Bld: 118 mg/dL — ABNORMAL HIGH (ref 70–99)
Glucose, Bld: 124 mg/dL — ABNORMAL HIGH (ref 70–99)
Glucose, Bld: 138 mg/dL — ABNORMAL HIGH (ref 70–99)
Glucose, Bld: 138 mg/dL — ABNORMAL HIGH (ref 70–99)
Potassium: 3.2 mmol/L — ABNORMAL LOW (ref 3.5–5.1)
Potassium: 3.3 mmol/L — ABNORMAL LOW (ref 3.5–5.1)
Potassium: 3.7 mmol/L (ref 3.5–5.1)
Potassium: 3.8 mmol/L (ref 3.5–5.1)
Sodium: 141 mmol/L (ref 135–145)
Sodium: 142 mmol/L (ref 135–145)
Sodium: 142 mmol/L (ref 135–145)
Sodium: 142 mmol/L (ref 135–145)

## 2019-10-13 LAB — CBC
HCT: 22.9 % — ABNORMAL LOW (ref 39.0–52.0)
Hemoglobin: 7.1 g/dL — ABNORMAL LOW (ref 13.0–17.0)
MCH: 26.8 pg (ref 26.0–34.0)
MCHC: 31 g/dL (ref 30.0–36.0)
MCV: 86.4 fL (ref 80.0–100.0)
Platelets: 61 10*3/uL — ABNORMAL LOW (ref 150–400)
RBC: 2.65 MIL/uL — ABNORMAL LOW (ref 4.22–5.81)
RDW: 22.9 % — ABNORMAL HIGH (ref 11.5–15.5)
WBC: 9.9 10*3/uL (ref 4.0–10.5)
nRBC: 0 % (ref 0.0–0.2)

## 2019-10-13 LAB — HEMOGLOBIN AND HEMATOCRIT, BLOOD
HCT: 26.3 % — ABNORMAL LOW (ref 39.0–52.0)
Hemoglobin: 8.2 g/dL — ABNORMAL LOW (ref 13.0–17.0)

## 2019-10-13 LAB — PREPARE RBC (CROSSMATCH)

## 2019-10-13 MED ORDER — POTASSIUM CHLORIDE CRYS ER 20 MEQ PO TBCR
40.0000 meq | EXTENDED_RELEASE_TABLET | Freq: Once | ORAL | Status: AC
  Administered 2019-10-13: 40 meq via ORAL
  Filled 2019-10-13: qty 2

## 2019-10-13 MED ORDER — SODIUM CHLORIDE 0.9% IV SOLUTION: Freq: Once | INTRAVENOUS | Status: AC

## 2019-10-13 MED ORDER — MIDODRINE HCL 5 MG PO TABS
10.0000 mg | ORAL_TABLET | Freq: Three times a day (TID) | ORAL | Status: DC
  Administered 2019-10-13 – 2019-10-23 (×31): 10 mg via ORAL
  Filled 2019-10-13 (×31): qty 2

## 2019-10-13 NOTE — Progress Notes (Addendum)
SBP 84/58. Recheck after 20 min BP 94/63. MD notified. Midodrine had been increased. Blood just started- infusing. Pt is not symptomatic. BP at end of blood infusion 105/67.

## 2019-10-13 NOTE — Progress Notes (Signed)
Progress Note  Patient Name: Colton Lamb Date of Encounter: 10/13/2019  Primary Cardiologist: Ida Rogue, MD   Subjective   No acute events over the past 24 hours.  Patient states doing okay.  Blood pressure has slightly improved with increased dose of midodrine.  Inpatient Medications    Scheduled Meds: . ascorbic acid  500 mg Oral BID  . atorvastatin  80 mg Oral Daily  . balsalazide  750 mg Oral TID  . Chlorhexidine Gluconate Cloth  6 each Topical Daily  . collagenase  1 application Topical Daily  . [START ON 10/14/2019] digoxin  0.0625 mg Oral Q48H  . feeding supplement (NEPRO CARB STEADY)  237 mL Oral TID BM  . finasteride  5 mg Oral Daily  . influenza vaccine adjuvanted  0.5 mL Intramuscular Tomorrow-1000  . lactobacillus  1 g Oral TID WC  . linezolid  600 mg Oral BID  . loperamide  2 mg Oral Q8H  . midodrine  5 mg Oral TID  . multivitamin with minerals  1 tablet Oral Daily  . pantoprazole  40 mg Oral Daily  . tamsulosin  0.4 mg Oral QPC breakfast  . zinc sulfate  220 mg Oral Daily   Continuous Infusions: . heparin Stopped (10/12/19 1127)  . piperacillin-tazobactam (ZOSYN)  IV 3.375 g (10/13/19 0945)  .  sodium bicarbonate (isotonic) infusion in sterile water 50 mL/hr at 10/13/19 0627   PRN Meds: acetaminophen **OR** acetaminophen, ipratropium-albuterol, ondansetron (ZOFRAN) IV   Vital Signs    Vitals:   10/12/19 2035 10/13/19 0456 10/13/19 0457 10/13/19 0732  BP: (!) 116/48  110/66 (!) 100/58  Pulse: (!) 109  87 96  Resp: 20  18   Temp: 98 F (36.7 C)  98 F (36.7 C) 97.9 F (36.6 C)  TempSrc: Oral   Oral  SpO2: 92%  100% 100%  Weight:  87.2 kg    Height:        Intake/Output Summary (Last 24 hours) at 10/13/2019 1056 Last data filed at 10/13/2019 0456 Gross per 24 hour  Intake 1538.51 ml  Output 3000 ml  Net -1461.49 ml   Last 3 Weights 10/13/2019 10/12/2019 10/11/2019  Weight (lbs) 192 lb 3.2 oz 183 lb 12.8 oz 238 lb 1.6 oz  Weight (kg)  87.181 kg 83.371 kg 108 kg      Telemetry    Atrial fibrillation, heart rate 99-102- Personally Reviewed  ECG    No new tracing obtained- Personally Reviewed  Physical Exam   GEN: No acute distress.   Neck: No JVD Cardiac:  Irregular irregular, no murmurs, rubs, or gallops.  Respiratory:  Decreased breath sounds at bases GI: Soft, nontender, non-distended  MS: No edema Neuro:  Nonfocal  Psych: Normal affect   Labs    High Sensitivity Troponin:  No results for input(s): TROPONINIHS in the last 720 hours.    Chemistry Recent Labs  Lab 10/11/19 0933 10/12/19 0821 10/12/19 1826 10/12/19 2347 10/13/19 0702  NA 139   < > 145 142 141  K 3.9   < > 3.7 3.2* 3.3*  CL 107   < > 107 105 104  CO2 16*   < > 22 21* 22  GLUCOSE 94   < > 144* 138* 138*  BUN 73*   < > 67* 65* 62*  CREATININE 4.64*   < > 4.16* 4.17* 3.99*  CALCIUM 7.9*   < > 7.9* 7.7* 7.7*  PROT 6.2*  --   --   --   --  ALBUMIN 2.1*  --   --   --   --   AST 19  --   --   --   --   ALT 15  --   --   --   --   ALKPHOS 88  --   --   --   --   BILITOT 0.9  --   --   --   --   GFRNONAA 12*   < > 14* 14* 14*  GFRAA 14*   < > 16* 16* 17*  ANIONGAP 16*   < > 16* 16* 15   < > = values in this interval not displayed.     Hematology Recent Labs  Lab 10/11/19 0933 10/12/19 0821 10/13/19 0702  WBC 12.0* 10.3 9.9  RBC 3.20* 2.99* 2.65*  HGB 8.5* 8.0* 7.1*  HCT 27.8* 26.1* 22.9*  MCV 86.9 87.3 86.4  MCH 26.6 26.8 26.8  MCHC 30.6 30.7 31.0  RDW 23.0* 22.8* 22.9*  PLT 109* 82* 61*    BNP Recent Labs  Lab 10/11/19 0933  BNP 1,655.0*     DDimer No results for input(s): DDIMER in the last 168 hours.   Radiology    US RENAL  Result Date: 10/11/2019 CLINICAL DATA:  Acute kidney injury EXAM: RENAL ULTRASOUND COMPARISON:  November 03, 2018 FINDINGS: Right Kidney: Renal measurements: 12.8 x 6.4 x 5.4 cm = volume: 234.4 mL . Echogenicity and renal cortical thickness are within normal limits. No mass, perinephric  fluid, or hydronephrosis visualized. No sonographically demonstrable calculus or ureterectasis. Left Kidney: Renal measurements: 13.4 x 5.2 x 6.5 cm = volume: 238.4 mL. Echogenicity and renal cortical thickness are within normal limits. No mass, perinephric fluid, or hydronephrosis visualized. No sonographically demonstrable calculus or ureterectasis. Bladder: Decompressed with Foley catheter and cannot be assessed. Other: None. IMPRESSION: Normal appearing kidneys bilaterally.  Urinary bladder decompressed. Electronically Signed   By: Lowella Grip III M.D.   On: 10/11/2019 15:23    Cardiac Studies   TTE 09/2019 1. Left ventricular ejection fraction, by estimation, is 25 to 30%. The  left ventricle has severely decreased function. The left ventricle  demonstrates regional wall motion abnormalities , anterior , anterospetal  and apical wall hypokinesis.  2. Right ventricular systolic function is normal. The right ventricular  size is normal. There is mildly elevated pulmonary artery systolic  pressure.  3. Left atrial size was mildly dilated.  4. No vegetation noted.   Patient Profile     69 y.o. 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 Atrial Fibrillation with rapid ventricular response -Heart rates currently 101, blood pressure improved from yesterday with increasing midodrine dose. -Continue digoxin at current renal dose -heparin as per pharm protocol -Increase midodrine to 10 mg 3 times daily to help with blood pressure. -The patient's tachycardia is likely a physiologic response from infection/hypotension/sepsis.Hopefully with  increased blood pressure (via midodrine use), patient's tachycardia will be improved.  -Low blood pressures preventing the use of beta-blockers, calcium channel blockers amiodarone.  2. Heart failure reduced ejection fraction, last EF 25 to 30% status post ICD. -Low blood pressures preventing addition of heart failure therapy -Continue dig and midodrine as mentioned above for pressure support  3. Decubitus ulcer, sepsis, infection -abx as per primary team    Signed, Kate Sable, MD  10/13/2019, 10:56 AM

## 2019-10-13 NOTE — Progress Notes (Signed)
PROGRESS NOTE    Colton Lamb  QQI:[id number] DOB: 01/05/1951 DOA: 10/11/2019 PCP: [name], MD       Assessment & Plan:   Principal Problem:   Acute renal failure (ARF) ([place]) Active Problems:   Hyperlipidemia   CAD S/P percutaneous coronary angioplasty - multiple PCIs   Atrial fibrillation (HCC)   GERD   CROHN'S DISEASE-LARGE & SMALL INTESTINE   Hypertension, essential   Chronic systolic CHF (congestive heart failure) (HCC)   Normocytic anemia   Chronic osteomyelitis ([place])   Colostomy in place [facility])   Bacteremia due to Pseudomonas   Diarrhea   COVID-19 virus infection   Thrombocytopenia ([place])   Sacral decubitus ulcer, stage IV ([place])  Acute renal failure: Cr continues to trend down. Baseline around 0.82. Likely due to prerenal secondary to dehydration. Nephro following and recs apprec. Continue on IVFs. Avoid nephrotoxics meds, hypotension  Diarrhea: c diff PCR negative. GI path panel pending.  Hyperlipidemia: continue on statin   CAD: s/p multiple PCIs. No CP. Continue w/ aspirin and lipitro  Atrial fibrillation: w/ RVR. PAF vs chronic. Hold xarelto secondary to AKI and started on IV heparin. IV heparin on hold secondary to bleeding stage IV sacral decubitus ulcer. Continue on digoxin.  S/p IV diltiazem x 1. Continue on tele. Cardio following and recs apprec   GERD: continue on PPI   Crohns Disease: continue balsalazide. S/p colostomy  Chronic systolic CHF: echo on 9/41/7408 showed EF 25-30%. BNP is elevated at 1655, but does not have obvious CHF exacerbation.  Due to AKI, will not start diuretics. Monitor volume status closely  Normocytic anemia & acute blood loss anemia: possibly secondary to IV heparin use. Will transfuse 1 unit of PRBCs today. Repeat H&H 4 hours post-transfusion. Will monitor H&H.   Chronic osteomyelitis & bacteremia: secondary to pseudomonas. Continue on zosyn until 10/21/19 & linezolid until 10/14/19 as per ID on last admission.  Repeat blood cx NGTD  COVID-19 virus infection: asymptomatic  Thrombocytopenia: Likely due to ongoing infection vs abx use. Will continue to monitor   Sacral decubitus ulcer, stage IV: present on admission. Bleeding from ulcer, so continue to hold heparin drip. Wound care consulted   Generalized weakness: PT/OT consulted  Failure to thrive: lives at home alone. Cannot take care of himself at home and pt agrees with this   DVT prophylaxis: IV heparin (on hold) secondary to acute blood loss anemia Code Status: full  Family Communication: discussed pt's care w/ pt's son, [name], and answered his questions Disposition Plan: needs to d/c to SNF vs LTAC, pt is unable to care for himself at home    Consultants:   nephro  cardio   Procedures:    Antimicrobials: zosyn, linezolid   Subjective: Pt c/o malaise   Objective: Vitals:   10/12/19 2035 10/13/19 0456 10/13/19 0457 10/13/19 0732  BP: (!) 116/48  110/66 (!) 100/58  Pulse: (!) 109  87 96  Resp: 20  18   Temp: 98 F (36.7 C)  98 F (36.7 C) 97.9 F (36.6 C)  TempSrc: Oral   Oral  SpO2: 92%  100% 100%  Weight:  87.2 kg    Height:        Intake/Output Summary (Last 24 hours) at 10/13/2019 0824 Last data filed at 10/13/2019 0456 Gross per 24 hour  Intake 1758.51 ml  Output 3200 ml  Net -1441.49 ml   Filed Weights   10/11/19 0856 10/12/19 0200 10/13/19 0456  Weight: 108 kg 83.4 kg  87.2 kg    Examination:  General exam: Appears calm and comfortable  Respiratory system: Clear to auscultation. No wheezes, rales or rhonchi Cardiovascular system: irregularly irregular. No, rubs, gallops or clicks.  Gastrointestinal system: Abdomen is nondistended, soft and nontender. Normal bowel sounds heard. Central nervous system: Alert and oriented. Moves all 4 extremities Psychiatry: Judgement and insight appear normal. Flat mood and affect.     Data Reviewed: I have personally reviewed following labs and imaging  studies  CBC: Recent Labs  Lab 10/11/19 0933 10/12/19 0821 10/13/19 0702  WBC 12.0* 10.3 9.9  NEUTROABS 9.9*  --   --   HGB 8.5* 8.0* 7.1*  HCT 27.8* 26.1* 22.9*  MCV 86.9 87.3 86.4  PLT 109* 82* 61*   Basic Metabolic Panel: Recent Labs  Lab 10/12/19 0821 10/12/19 1216 10/12/19 1826 10/12/19 2347 10/13/19 0702  NA 142 143 145 142 141  K 3.6 4.4 3.7 3.2* 3.3*  CL 107 108 107 105 104  CO2 18* 18* 22 21* 22  GLUCOSE 90 116* 144* 138* 138*  BUN 69* 65* 67* 65* 62*  CREATININE 4.42* 4.30* 4.16* 4.17* 3.99*  CALCIUM 7.7* 7.7* 7.9* 7.7* 7.7*  MG 1.8  --   --   --   --    GFR: Estimated Creatinine Clearance: 18.9 mL/min (A) (by C-G formula based on SCr of 3.99 mg/dL (H)). Liver Function Tests: Recent Labs  Lab 10/11/19 0933  AST 19  ALT 15  ALKPHOS 88  BILITOT 0.9  PROT 6.2*  ALBUMIN 2.1*   No results for input(s): LIPASE, AMYLASE in the last 168 hours. No results for input(s): AMMONIA in the last 168 hours. Coagulation Profile: Recent Labs  Lab 10/11/19 2107  INR 1.4*   Cardiac Enzymes: No results for input(s): CKTOTAL, CKMB, CKMBINDEX, TROPONINI in the last 168 hours. BNP (last 3 results) No results for input(s): PROBNP in the last 8760 hours. HbA1C: No results for input(s): HGBA1C in the last 72 hours. CBG: No results for input(s): GLUCAP in the last 168 hours. Lipid Profile: No results for input(s): CHOL, HDL, LDLCALC, TRIG, CHOLHDL, LDLDIRECT in the last 72 hours. Thyroid Function Tests: No results for input(s): TSH, T4TOTAL, FREET4, T3FREE, THYROIDAB in the last 72 hours. Anemia Panel: No results for input(s): VITAMINB12, FOLATE, FERRITIN, TIBC, IRON, RETICCTPCT in the last 72 hours. Sepsis Labs: Recent Labs  Lab 10/11/19 2315 10/12/19 0208  LATICACIDVEN 1.5 1.6    Recent Results (from the past 240 hour(s))  C Difficile Quick Screen w PCR reflex     Status: None   Collection Time: 10/11/19  9:33 AM   Specimen: STOOL  Result Value Ref Range  Status   C Diff antigen NEGATIVE NEGATIVE Final   C Diff toxin NEGATIVE NEGATIVE Final   C Diff interpretation No C. difficile detected.  Final    Comment: Performed at [facility], [address] [place], [place] [address]  Culture, blood (Routine X 2) w Reflex to ID Panel     Status: None (Preliminary result)   Collection Time: 10/11/19  4:34 PM   Specimen: BLOOD RIGHT HAND  Result Value Ref Range Status   Specimen Description BLOOD RIGHT HAND  Final   Special Requests   Final    BOTTLES DRAWN AEROBIC ONLY Blood Culture results may not be optimal due to an inadequate volume of blood received in culture bottles   Culture   Final    NO GROWTH 2 DAYS Performed at [facility],  [place], [place] [address]    Report Status PENDING  Incomplete         Radiology Studies: US RENAL  Result Date: 10/11/2019 CLINICAL DATA:  Acute kidney injury EXAM: RENAL ULTRASOUND COMPARISON:  November 03, 2018 FINDINGS: Right Kidney: Renal measurements: 12.8 x 6.4 x 5.4 cm = volume: 234.4 mL . Echogenicity and renal cortical thickness are within normal limits. No mass, perinephric fluid, or hydronephrosis visualized. No sonographically demonstrable calculus or ureterectasis. Left Kidney: Renal measurements: 13.4 x 5.2 x 6.5 cm = volume: 238.4 mL. Echogenicity and renal cortical thickness are within normal limits. No mass, perinephric fluid, or hydronephrosis visualized. No sonographically demonstrable calculus or ureterectasis. Bladder: Decompressed with Foley catheter and cannot be assessed. Other: None. IMPRESSION: Normal appearing kidneys bilaterally.  Urinary bladder decompressed. Electronically Signed   By: Lowella Grip III M.D.   On: 10/11/2019 15:23        Scheduled Meds: . ascorbic acid  500 mg Oral BID  . atorvastatin  80 mg Oral Daily  . balsalazide  750 mg Oral TID  . Chlorhexidine Gluconate Cloth  6 each Topical Daily  . collagenase  1 application  Topical Daily  . [START ON 10/14/2019] digoxin  0.0625 mg Oral Q48H  . feeding supplement (NEPRO CARB STEADY)  237 mL Oral TID BM  . finasteride  5 mg Oral Daily  . influenza vaccine adjuvanted  0.5 mL Intramuscular Tomorrow-1000  . lactobacillus  1 g Oral TID WC  . linezolid  600 mg Oral BID  . loperamide  2 mg Oral Q8H  . midodrine  5 mg Oral TID  . multivitamin with minerals  1 tablet Oral Daily  . pantoprazole  40 mg Oral Daily  . tamsulosin  0.4 mg Oral QPC breakfast  . zinc sulfate  220 mg Oral Daily   Continuous Infusions: . heparin Stopped (10/12/19 1127)  . piperacillin-tazobactam (ZOSYN)  IV Stopped (10/13/19 0315)  .  sodium bicarbonate (isotonic) infusion in sterile water 50 mL/hr at 10/13/19 1572     LOS: 2 days    Time spent: 30 mins     Wyvonnia Dusky, MD [facility] Pager [phone]  If 7PM-7AM, please contact night-coverage [email or web address] 10/13/2019, 8:24 AM

## 2019-10-13 NOTE — Consult Note (Signed)
[facility] for Heparin Infusion Indication: atrial fibrillation  Allergies  Allergen Reactions  . Iodine Other (See Comments)    Shortness of breath, swelling and hives  . Shrimp [Shellfish Allergy] Other (See Comments)    SWELLING    HIVES    SHORTNESS OF BREATH  . Tetracycline Rash    Patient Measurements: Height: 5' 11"  (180.3 cm) Weight: 192 lb 3.2 oz (87.2 kg) IBW/kg (Calculated) : 75.3 Heparin Dosing Weight: 98.3 kg  Vital Signs: Temp: 97.9 F (36.6 C) (03/14 1117) Temp Source: Oral (03/14 1117) BP: 93/57 (03/14 1117) Pulse Rate: 63 (03/14 1117)  Labs: Recent Labs    10/11/19 0933 10/11/19 0933 10/11/19 2107 10/12/19 0821 10/12/19 1216 10/12/19 2347 10/13/19 0702 10/13/19 1154  HGB 8.5*   < >  --  8.0*  --   --  7.1*  --   HCT 27.8*  --   --  26.1*  --   --  22.9*  --   PLT 109*  --   --  82*  --   --  61*  --   APTT  --   --  41* >160*  --   --   --   --   LABPROT  --   --  16.6*  --   --   --   --   --   INR  --   --  1.4*  --   --   --   --   --   HEPARINUNFRC  --   --  1.42* 1.30*  --   --   --   --   CREATININE 4.64*   < >  --  4.42*   < > 4.17* 3.99* 3.91*   < > = values in this interval not displayed.    Estimated Creatinine Clearance: 19.3 mL/min (A) (by C-G formula based on SCr of 3.91 mg/dL (H)).   Medical History: Past Medical History:  Diagnosis Date  . Atrial flutter ([place])    a. s/p Cardioversion 11/22/13, on amiodarone and Xarelto.  . CHF (congestive heart failure) ([place])   . Chronic osteomyelitis ([place]) 06/30/2019   s/p colostomy  . Chronic systolic heart failure ([place])    a. 10/2013 EF 20-25%, grade III DD, RV mildly dilated and sys fx mild/mod reduced;  b. 01/2014 Echo: EF 30-35%, gr3 DD, mod dil LA. c. 07/2019 EF 20-25%  . Coronary artery disease    a. s/p MI 2007/2015;  b. s/p prior PCI to the LAD/LCX/PDA/PL;  c. 2008: s/p Cypher DES to the OM.  Marland Kitchen Crohn's ileocolitis ([place])   . GERD  (gastroesophageal reflux disease)   . Hx of adenomatous colonic polyps 11/2003  . Hyperlipidemia   . Hypertension   . Hypotension   . Ischemic cardiomyopathy    s. 01/2014 s/p MDT [id number] [name] XT DR single lead AICD.  Marland Kitchen Obesity   . Paroxysmal atrial fibrillation (HCC)    a. CHA2DS2VASc = (CHF, HTN, agex1, DM)  . Sleep apnea   . Syncope    a.  11/2013 in setting of volume depletion and bradycardia due to dig toxicity   . Type II diabetes mellitus (HCC)     Medications:  Rivaroxaban 20 mg daily (Patient reported last dose was 3/11)  Assessment: Patient is a 69 y/o M with a history of atrial fibrillation on rivaroxaban who is admitted with acute renal failure. Rivaroxaban on hold due to renal function. Pharmacy has been  consulted to start heparin infusion.   Baseline CBC significant for a Hgb of 8.5 (appears c/w baseline), and platelets 109 (baseline is lower limit of normal). Baseline coags elevated with INR 1.4, aPTT 41, heparin level 1.42.   -IV heparin at 1300 units/hr to start at least 24h after last dose of rivaroxaban -Follow aPTTs for now in view of rivaroxaban interference with heparin level  3/13 @ 0821 aPTT >160, HL 1.30. Will hold heparin drip x 1 hour and restart at 1100 units/hr. 3/14: Per nurse(Crystal), patient's sacral wound continues to actively bleed and Dr. Jimmye Norman has instructed her to hold heparin infusion until the morning and will reassess. Drip has been stopped since 1127 this morning. Hemoglobin has also dropped.   Goal of Therapy:  aPTT 66-102 seconds Monitor platelets by anticoagulation protocol: Yes   Plan:  3/15- Per MD- continue to hold heparin drip today (nephrology giving 1 unit PRBC). F/u in am to see about restarting drip. Hgb 7.1  Plt 61  -Follow up with MD in the morning on potential restart. -Daily CBC per protocol  Noralee Space, PharmD Clinical Pharmacist 10/13/2019 1:22 PM

## 2019-10-13 NOTE — Progress Notes (Signed)
[facility]  ROUNDING NOTE   Subjective:   Patient states he is feeling better.    Creatinine 3.99 (4.17)  Sodium bicarbonate at 38m/hr Heparin gtt   Objective:  Vital signs in last 24 hours:  Temp:  [97.9 F (36.6 C)-98.2 F (36.8 C)] 97.9 F (36.6 C) (03/14 1117) Pulse Rate:  [63-112] 63 (03/14 1117) Resp:  [18-20] 18 (03/14 0457) BP: (93-116)/(48-70) 93/57 (03/14 1117) SpO2:  [92 %-100 %] 100 % (03/14 1117) Weight:  [87.2 kg] 87.2 kg (03/14 0456)  Weight change: -20.8 kg Filed Weights   10/11/19 0856 10/12/19 0200 10/13/19 0456  Weight: 108 kg 83.4 kg 87.2 kg    Intake/Output: I/O last 3 completed shifts: In: 1758.5 [P.O.:694; I.V.:1052.3; IV Piggyback:12.2] Out: 4250 [Urine:2650; SYCXKG:8185]  Intake/Output this shift:  No intake/output data recorded.  Physical Exam: General: NAD, laying in bed  Head: Normocephalic, atraumatic. Moist oral mucosal membranes  Eyes: Anicteric, PERRL  Neck: Supple, trachea midline  Lungs:  Clear to auscultation  Heart: Regular rate and rhythm  Abdomen:  Soft, nontender,   Extremities:  no peripheral edema.  Neurologic: Nonfocal, moving all four extremities   Skin Sacral decub, covered and clean       Basic Metabolic Panel: Recent Labs  Lab 10/12/19 0821 10/12/19 0821 10/12/19 1216 10/12/19 1216 10/12/19 1826 10/12/19 2347 10/13/19 0702  NA 142  --  143  --  145 142 141  K 3.6  --  4.4  --  3.7 3.2* 3.3*  CL 107  --  108  --  107 105 104  CO2 18*  --  18*  --  22 21* 22  GLUCOSE 90  --  116*  --  144* 138* 138*  BUN 69*  --  65*  --  67* 65* 62*  CREATININE 4.42*  --  4.30*  --  4.16* 4.17* 3.99*  CALCIUM 7.7*   < > 7.7*   < > 7.9* 7.7* 7.7*  MG 1.8  --   --   --   --   --   --    < > = values in this interval not displayed.    Liver Function Tests: Recent Labs  Lab 10/11/19 0933  AST 19  ALT 15  ALKPHOS 88  BILITOT 0.9  PROT 6.2*  ALBUMIN 2.1*   No results for input(s): LIPASE,  AMYLASE in the last 168 hours. No results for input(s): AMMONIA in the last 168 hours.  CBC: Recent Labs  Lab 10/11/19 0933 10/12/19 0821 10/13/19 0702  WBC 12.0* 10.3 9.9  NEUTROABS 9.9*  --   --   HGB 8.5* 8.0* 7.1*  HCT 27.8* 26.1* 22.9*  MCV 86.9 87.3 86.4  PLT 109* 82* 61*    Cardiac Enzymes: No results for input(s): CKTOTAL, CKMB, CKMBINDEX, TROPONINI in the last 168 hours.  BNP: Invalid input(s): POCBNP  CBG: No results for input(s): GLUCAP in the last 168 hours.  Microbiology: Results for orders placed or performed during the hospital encounter of 10/11/19  C Difficile Quick Screen w PCR reflex     Status: None   Collection Time: 10/11/19  9:33 AM   Specimen: STOOL  Result Value Ref Range Status   C Diff antigen NEGATIVE NEGATIVE Final   C Diff toxin NEGATIVE NEGATIVE Final   C Diff interpretation No C. difficile detected.  Final    Comment: [facility] [address], [place] [address] Culture, blood (Routine  X 2) w Reflex to ID Panel     Status: None (Preliminary result)   Collection Time: 10/11/19  4:34 PM   Specimen: BLOOD RIGHT HAND  Result Value Ref Range Status   Specimen Description BLOOD RIGHT HAND  Final   Special Requests   Final    BOTTLES DRAWN AEROBIC ONLY Blood Culture results may not be optimal due to an inadequate volume of blood received in culture bottles   Culture   Final    NO GROWTH 2 DAYS Performed at [facility], [address], [place], [place] [address]    Report Status PENDING  Incomplete    Coagulation Studies: Recent Labs    10/11/19 07-Oct-2105  LABPROT 16.6*  INR 1.4*    Urinalysis: Recent Labs    10/12/19 1145  COLORURINE YELLOW*  LABSPEC 1.011  PHURINE 5.0  GLUCOSEU NEGATIVE  HGBUR NEGATIVE  BILIRUBINUR NEGATIVE  KETONESUR NEGATIVE  PROTEINUR 100*  NITRITE NEGATIVE  LEUKOCYTESUR LARGE*      Imaging: US RENAL  Result Date: 10/11/2019 CLINICAL DATA:  Acute kidney  injury EXAM: RENAL ULTRASOUND COMPARISON:  November 03, 2018 FINDINGS: Right Kidney: Renal measurements: 12.8 x 6.4 x 5.4 cm = volume: 234.4 mL . Echogenicity and renal cortical thickness are within normal limits. No mass, perinephric fluid, or hydronephrosis visualized. No sonographically demonstrable calculus or ureterectasis. Left Kidney: Renal measurements: 13.4 x 5.2 x 6.5 cm = volume: 238.4 mL. Echogenicity and renal cortical thickness are within normal limits. No mass, perinephric fluid, or hydronephrosis visualized. No sonographically demonstrable calculus or ureterectasis. Bladder: Decompressed with Foley catheter and cannot be assessed. Other: None. IMPRESSION: Normal appearing kidneys bilaterally.  Urinary bladder decompressed. Electronically Signed   By: Lowella Grip III M.D.   On: 10/11/2019 15:23     Medications:   . heparin Stopped (10/12/19 1127)  . piperacillin-tazobactam (ZOSYN)  IV 3.375 g (10/13/19 0945)  .  sodium bicarbonate (isotonic) infusion in sterile water 50 mL/hr at 10/13/19 0627   . ascorbic acid  500 mg Oral BID  . atorvastatin  80 mg Oral Daily  . balsalazide  750 mg Oral TID  . Chlorhexidine Gluconate Cloth  6 each Topical Daily  . collagenase  1 application Topical Daily  . [START ON 10/14/2019] digoxin  0.0625 mg Oral Q48H  . feeding supplement (NEPRO CARB STEADY)  237 mL Oral TID BM  . finasteride  5 mg Oral Daily  . influenza vaccine adjuvanted  0.5 mL Intramuscular Tomorrow-1000  . lactobacillus  1 g Oral TID WC  . linezolid  600 mg Oral BID  . loperamide  2 mg Oral Q8H  . midodrine  10 mg Oral TID  . multivitamin with minerals  1 tablet Oral Daily  . pantoprazole  40 mg Oral Daily  . tamsulosin  0.4 mg Oral QPC breakfast  . zinc sulfate  220 mg Oral Daily   acetaminophen **OR** acetaminophen, ipratropium-albuterol, ondansetron (ZOFRAN) IV  Assessment/ Plan:  [name] is a 69 y.o. white male  with diabetes mellitus type II,  hypertension, obstructive sleep apnea, congestive heart failure, atrial fibrillation, hyperlipidemia, Crohn's disease, coronary artery disease, GERD, pacemaker,who was admitted to [facility] on 10/11/2019 for Diarrhea of infectious origin [A09] AKI (acute kidney injury) ([place]) [N17.9]  1. Acute renal failure with metabolic acidosis: with baseline creatinine of 0.82, GFR > 60 on 10/01/19.  with underlying diabetic nephropathy with glucosuria and proteinuria.  Secondary to diarrhea and prerenal azotemia.  Renal ultrasound negative. No  IV contrast exposure.  Nonoliguric urine output. No indication for dialysis.   - Continue IV fluids bicarb 25m/hr  2. Hypotension: holding blood pressure medications.  Placed on digoxin by cardiology for atrial fibrillation.  - midodrine  3. Anemia with renal failure and thrombocytopenia: hemoglobin 7.1. Normocytic. With bleeding from sacral wound.  Low threshold for PRBC blood transfusion.     LOS: 2 Janaisa Birkland 3/14/202111:33 AM

## 2019-10-14 ENCOUNTER — Ambulatory Visit: Payer: Medicare Other | Admitting: Family

## 2019-10-14 LAB — CBC
HCT: 25.7 % — ABNORMAL LOW (ref 39.0–52.0)
Hemoglobin: 7.9 g/dL — ABNORMAL LOW (ref 13.0–17.0)
MCH: 26.9 pg (ref 26.0–34.0)
MCHC: 30.7 g/dL (ref 30.0–36.0)
MCV: 87.4 fL (ref 80.0–100.0)
Platelets: 41 10*3/uL — ABNORMAL LOW (ref 150–400)
RBC: 2.94 MIL/uL — ABNORMAL LOW (ref 4.22–5.81)
RDW: 21.3 % — ABNORMAL HIGH (ref 11.5–15.5)
WBC: 9.7 10*3/uL (ref 4.0–10.5)
nRBC: 0 % (ref 0.0–0.2)

## 2019-10-14 LAB — BASIC METABOLIC PANEL
Anion gap: 14 (ref 5–15)
Anion gap: 15 (ref 5–15)
BUN: 59 mg/dL — ABNORMAL HIGH (ref 8–23)
BUN: 62 mg/dL — ABNORMAL HIGH (ref 8–23)
CO2: 25 mmol/L (ref 22–32)
CO2: 26 mmol/L (ref 22–32)
Calcium: 7.5 mg/dL — ABNORMAL LOW (ref 8.9–10.3)
Calcium: 7.6 mg/dL — ABNORMAL LOW (ref 8.9–10.3)
Chloride: 103 mmol/L (ref 98–111)
Chloride: 103 mmol/L (ref 98–111)
Creatinine, Ser: 3.68 mg/dL — ABNORMAL HIGH (ref 0.61–1.24)
Creatinine, Ser: 3.95 mg/dL — ABNORMAL HIGH (ref 0.61–1.24)
GFR calc Af Amer: 17 mL/min — ABNORMAL LOW (ref >60)
GFR calc Af Amer: 18 mL/min — ABNORMAL LOW (ref >60)
GFR calc non Af Amer: 15 mL/min — ABNORMAL LOW (ref >60)
GFR calc non Af Amer: 16 mL/min — ABNORMAL LOW (ref >60)
Glucose, Bld: 111 mg/dL — ABNORMAL HIGH (ref 70–99)
Glucose, Bld: 122 mg/dL — ABNORMAL HIGH (ref 70–99)
Potassium: 3.6 mmol/L (ref 3.5–5.1)
Potassium: 3.9 mmol/L (ref 3.5–5.1)
Sodium: 143 mmol/L (ref 135–145)
Sodium: 143 mmol/L (ref 135–145)

## 2019-10-14 LAB — GI PATHOGEN PANEL BY PCR, STOOL

## 2019-10-14 LAB — TYPE AND SCREEN
ABO/RH(D): B POS
Antibody Screen: NEGATIVE
Unit division: 0

## 2019-10-14 LAB — BPAM RBC
Blood Product Expiration Date: 202104182359
ISSUE DATE / TIME: 202103141532
Unit Type and Rh: 7300

## 2019-10-14 LAB — PATHOLOGIST SMEAR REVIEW

## 2019-10-14 MED ORDER — CALCIUM GLUCONATE-NACL 1-0.675 GM/50ML-% IV SOLN
1.0000 g | Freq: Once | INTRAVENOUS | Status: AC
  Administered 2019-10-14: 1000 mg via INTRAVENOUS
  Filled 2019-10-14: qty 50

## 2019-10-14 NOTE — Progress Notes (Signed)
PROGRESS NOTE    Colton Lamb  YBW:[id number] DOB: 1951-06-09 DOA: 10/11/2019 PCP: [name], MD       Assessment & Plan:   Principal Problem:   Acute renal failure (ARF) ([place]) Active Problems:   Hyperlipidemia   CAD S/P percutaneous coronary angioplasty - multiple PCIs   Atrial fibrillation (HCC)   GERD   CROHN'S DISEASE-LARGE & SMALL INTESTINE   Hypertension, essential   Chronic systolic CHF (congestive heart failure) (HCC)   Normocytic anemia   Chronic osteomyelitis ([place])   Colostomy in place [facility])   Bacteremia due to Pseudomonas   Diarrhea   COVID-19 virus infection   Thrombocytopenia (HCC)   Sacral decubitus ulcer, stage IV ([place])  Acute renal failure: Cr is trending down daily. Baseline around 0.82. Likely due to prerenal secondary to dehydration. Nephro following and recs apprec. Continue on IVFs. Avoid nephrotoxics meds, hypotension  Diarrhea: c diff PCR negative. GI path panel completely neg. Continue w/ imodium   Hyperlipidemia: continue on statin   CAD: s/p multiple PCIs. No CP. Continue w/ aspirin and lipitro  Atrial fibrillation: w/ RVR. PAF vs chronic. Hold xarelto secondary to AKI and started on IV heparin. IV heparin on hold secondary to bleeding stage IV sacral decubitus ulcer. Continue on digoxin.  S/p IV diltiazem x 1. Continue on tele. Cardio following and recs apprec   GERD: continue on PPI   Crohns Disease: continue balsalazide. S/p colostomy  Chronic systolic CHF: echo on 7/68/1157 showed EF 25-30%. BNP is elevated at 1655, but does not have obvious CHF exacerbation.  Due to AKI, will not start diuretics. Monitor volume status closely  Normocytic anemia & acute blood loss anemia: possibly secondary to IV heparin use. S/p 1 unit PRBCs 10/13/19.  Will monitor H&H.   Chronic osteomyelitis & bacteremia: secondary to pseudomonas. Continue on zosyn until 10/21/19 as ID on last admission. Completed linezolid course today 10/14/19. Repeat  blood cx NGTD  COVID-19 virus infection: asymptomatic  Thrombocytopenia: Likely due to ongoing infection vs abx use. No need for a transfusion at this time. Will continue to monitor   Sacral decubitus ulcer, stage IV: present on admission. Bleeding from ulcer, so continue to hold heparin drip. Wound care consulted   Generalized weakness: OT recs SNF; PT signed off   Failure to thrive: lives at home alone. Cannot take care of himself at home and pt agrees with this   DVT prophylaxis: IV heparin (on hold) secondary to acute blood loss anemia Code Status: full  Family Communication: called pt's son, but no answer so I left a voicemail  Disposition Plan: needs to d/c to SNF vs LTAC, pt is unable to care for himself at home    Consultants:   nephro  cardio   Procedures:    Antimicrobials: zosyn   Subjective: Pt c/o fatigue  Objective: Vitals:   10/13/19 1840 10/13/19 2022 10/14/19 0412 10/14/19 0807  BP: 105/67 105/66 (!) 94/58 103/67  Pulse:  (!) 113 92 92  Resp: 18 20 20 18   Temp: 98.1 F (36.7 C) 98 F (36.7 C) 98 F (36.7 C) 97.8 F (36.6 C)  TempSrc: Oral Oral Oral Oral  SpO2:  100% 100% 100%  Weight:   88.5 kg   Height:        Intake/Output Summary (Last 24 hours) at 10/14/2019 0818 Last data filed at 10/14/2019 0000 Gross per 24 hour  Intake 1357.51 ml  Output 1750 ml  Net -392.49 ml   [employer]  10/12/19 0200 10/13/19 0456 10/14/19 0412  Weight: 83.4 kg 87.2 kg 88.5 kg    Examination:  General exam: Appears calm and comfortable  Respiratory system: Clear to auscultation. No accessory muscle use Cardiovascular system: irregularly irregular. No, rubs, gallops or clicks.  Gastrointestinal system: Abdomen is nondistended, soft and nontender. Hyperactive bowel sounds heard. Central nervous system: Alert and oriented. Moves all 4 extremities Psychiatry: Judgement and insight appear normal. Flat mood and affect.     Data Reviewed: I have  personally reviewed following labs and imaging studies  CBC: Recent Labs  Lab 10/11/19 0933 10/12/19 0821 10/13/19 0702 10/13/19 1923 10/14/19 0552  WBC 12.0* 10.3 9.9  --  9.7  NEUTROABS 9.9*  --   --   --   --   HGB 8.5* 8.0* 7.1* 8.2* 7.9*  HCT 27.8* 26.1* 22.9* 26.3* 25.7*  MCV 86.9 87.3 86.4  --  87.4  PLT 109* 82* 61*  --  41*   Basic Metabolic Panel: Recent Labs  Lab 10/12/19 0821 10/12/19 1216 10/13/19 0702 10/13/19 1154 10/13/19 1923 10/14/19 0005 10/14/19 0552  NA 142   < > 141 142 142 143 143  K 3.6   < > 3.3* 3.8 3.7 3.6 3.9  CL 107   < > 104 104 104 103 103  CO2 18*   < > 22 24 26 25 26   GLUCOSE 90   < > 138* 118* 124* 122* 111*  BUN 69*   < > 62* 61* 59* 62* 59*  CREATININE 4.42*   < > 3.99* 3.91* 3.83* 3.95* 3.68*  CALCIUM 7.7*   < > 7.7* 7.8* 7.6* 7.5* 7.6*  MG 1.8  --   --   --   --   --   --    < > = values in this interval not displayed.   GFR: Estimated Creatinine Clearance: 20.5 mL/min (A) (by C-G formula based on SCr of 3.68 mg/dL (H)). Liver Function Tests: Recent Labs  Lab 10/11/19 0933  AST 19  ALT 15  ALKPHOS 88  BILITOT 0.9  PROT 6.2*  ALBUMIN 2.1*   No results for input(s): LIPASE, AMYLASE in the last 168 hours. No results for input(s): AMMONIA in the last 168 hours. Coagulation Profile: Recent Labs  Lab 10/11/19 2107  INR 1.4*   Cardiac Enzymes: No results for input(s): CKTOTAL, CKMB, CKMBINDEX, TROPONINI in the last 168 hours. BNP (last 3 results) No results for input(s): PROBNP in the last 8760 hours. HbA1C: No results for input(s): HGBA1C in the last 72 hours. CBG: No results for input(s): GLUCAP in the last 168 hours. Lipid Profile: No results for input(s): CHOL, HDL, LDLCALC, TRIG, CHOLHDL, LDLDIRECT in the last 72 hours. Thyroid Function Tests: No results for input(s): TSH, T4TOTAL, FREET4, T3FREE, THYROIDAB in the last 72 hours. Anemia Panel: No results for input(s): VITAMINB12, FOLATE, FERRITIN, TIBC, IRON,  RETICCTPCT in the last 72 hours. Sepsis Labs: Recent Labs  Lab 10/11/19 2315 10/12/19 0208  LATICACIDVEN 1.5 1.6    Recent Results (from the past 240 hour(s))  C Difficile Quick Screen w PCR reflex     Status: None   Collection Time: 10/11/19  9:33 AM   Specimen: STOOL  Result Value Ref Range Status   C Diff antigen NEGATIVE NEGATIVE Final   C Diff toxin NEGATIVE NEGATIVE Final   C Diff interpretation No C. difficile detected.  Final    Comment: Performed at [facility], [address] [place], [place] [address]  Culture, blood (Routine X 2) w Reflex to ID Panel     Status: None (Preliminary result)   Collection Time: 10/11/19  4:34 PM   Specimen: BLOOD RIGHT HAND  Result Value Ref Range Status   Specimen Description BLOOD RIGHT HAND  Final   Special Requests   Final    BOTTLES DRAWN AEROBIC ONLY Blood Culture results may not be optimal due to an inadequate volume of blood received in culture bottles   Culture   Final    NO GROWTH 3 DAYS Performed at [facility], [address], [place], [place] [address]    Report Status PENDING  Incomplete         Radiology Studies: No results found.      Scheduled Meds: . ascorbic acid  500 mg Oral BID  . atorvastatin  80 mg Oral Daily  . balsalazide  750 mg Oral TID  . Chlorhexidine Gluconate Cloth  6 each Topical Daily  . collagenase  1 application Topical Daily  . digoxin  0.0625 mg Oral Q48H  . feeding supplement (NEPRO CARB STEADY)  237 mL Oral TID BM  . finasteride  5 mg Oral Daily  . influenza vaccine adjuvanted  0.5 mL Intramuscular Tomorrow-1000  . lactobacillus  1 g Oral TID WC  . linezolid  600 mg Oral BID  . loperamide  2 mg Oral Q8H  . midodrine  10 mg Oral TID  . multivitamin with minerals  1 tablet Oral Daily  . pantoprazole  40 mg Oral Daily  . tamsulosin  0.4 mg Oral QPC breakfast  . zinc sulfate  220 mg Oral Daily   Continuous Infusions: . piperacillin-tazobactam (ZOSYN)  IV  3.375 g (10/13/19 2142)  .  sodium bicarbonate (isotonic) infusion in sterile water 50 mL/hr at 10/13/19 0700     LOS: 3 days    Time spent: 30 mins     Wyvonnia Dusky, MD [facility] Pager [phone]  If 7PM-7AM, please contact night-coverage [email or web address] 10/14/2019, 8:18 AM

## 2019-10-14 NOTE — Evaluation (Signed)
Occupational Therapy Evaluation Patient Details Name: Colton Lamb MRN: [id number] DOB: 1950/09/03 Today's Date: 10/14/2019    History of Present Illness Per MD note: 69 y.o. male was recently in hospital and admitted from home due to leaking colostomy bag and acute renal failure.  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  Also found to be COVID positive. Being treated for pseudomonas bacteremia and possible acute on chronic osteomyelitis. Patient has multiple pressure ulcers present on arrival. Pt well known to therapy and reports he hasn't ambulated in almost 1 year. Has been receiving care at home with a [facility] aide and his sister who indicates she can no longer care for him..   Clinical Impression   Pt is 69 year old male who is familiar with patient who was discharged home with sister and HHA despite rec for SNF and is back about a week later with leaking colostomy bag and acute renal failure.  Per chart review, neither his sister or [facility] staff are able to care for him due to medical complexity and basically bed bound with a colostomy bag and a sacral decub that has been present for a long time.  He presents with weakness in BUEs and BLEs but is able to complete self feeding and grooming skills and UB sponge bathing but is TOTAL assist for LB dressing, bathing and colostomy care.He was COVID + on 2/22 but was asymptomatic but would benefit from ECS during ADLs to increase stamina and endurance.  Since pt has been here several times and has agreed to rehab but has had minimal participation for [place] activities, rec OT trial of 1 week to document progress with functional goals.  Pt would benefit from skilled OT services to increase independence in ADLs, education in energy conservation  techniques, pursed lip breathing and functional mobility for ADLs.  Pt will either need LTACH or SNF based on availability of family for safe plan home again which looks bleak at this time.      Follow Up Recommendations  SNF;LTACH(pt may need LTACH if family cannot provide 24/7 care after SNF rehab)    Equipment Recommendations       Recommendations for Other Services       Precautions / Restrictions Precautions Precautions: Fall Precaution Comments: sacral wound Restrictions Weight Bearing Restrictions: No      Mobility Bed Mobility                  Transfers                      Balance                                           ADL either performed or assessed with clinical judgement   ADL Overall ADL's : Needs assistance/impaired Eating/Feeding: Set up;Bed level Eating/Feeding Details (indicate cue type and reason): HOB elevated Grooming: Set up;Bed level Grooming Details (indicate cue type and reason): HOB elevated Upper Body Bathing: Minimal assistance;Bed level Upper Body Bathing Details (indicate cue type and reason): HOB elevated Lower Body Bathing: Total assistance;Bed level Lower Body Bathing Details (indicate cue type and reason): using lateral rolling technique Upper Body Dressing :  Minimal assistance;Bed level   Lower Body Dressing: Total assistance;Bed level Lower Body Dressing Details (indicate cue type and reason): due to weakness Toilet Transfer: +2 for physical assistance;Maximal assistance;Set up;Grab bars Toilet Transfer Details (indicate cue type and reason): pt currently has urinal and a colostomy bag which he needs max assist to manage and is able to use urinal but HOB needs to be increased.  Pt has not been getting up out of bed since he went home. Toileting- Clothing Manipulation and Hygiene: Total assistance Toileting - Clothing Manipulation Details (indicate cue type and reason): MAX A for peri care at bed  level using lateral rolling technique, pt able to contribute to rolling with UB with use of bed rails       General ADL Comments: Pt continues to require max to total A for ADL management at bed level. +2 max assist for bed mobility. Has not been able to attempt functional mobility to this date. He is limited by poor activity tolerance, pain, and generalized weakness.     Vision Patient Visual Report: No change from baseline       Perception     Praxis      Pertinent Vitals/Pain Pain Assessment: No/denies pain(no pain during sacral wound care either)     Hand Dominance Right   Extremity/Trunk Assessment Upper Extremity Assessment Upper Extremity Assessment: Generalized weakness   Lower Extremity Assessment Lower Extremity Assessment: Defer to PT evaluation       Communication Communication Communication: No difficulties   Cognition Arousal/Alertness: Awake/alert Behavior During Therapy: WFL for tasks assessed/performed Overall Cognitive Status: Within Functional Limits for tasks assessed                                 General Comments: agreeable to work with therapy and consider SNF to work on more standing and progression of fuctional mobility.   General Comments       Exercises     Shoulder Instructions      Home Living Family/patient expects to be discharged to:: Skilled nursing facility Living Arrangements: Other relatives Available Help at Discharge: Family;Available PRN/intermittently Type of Home: Mobile home Home Access: Stairs to enter Entrance Stairs-Number of Steps: 3 Entrance Stairs-Rails: Right Home Layout: One level     Bathroom Shower/Tub: Occupational psychologist: Handicapped height     Home Equipment: Environmental consultant - 2 wheels;Hand held shower head;Wheelchair - manual;Hospital bed          Prior Functioning/Environment Level of Independence: Needs assistance        Comments: On this admit, pt reports he hasn't  ambulated or got OOB in almost 1 year. Has been reliant on staff for all needs.He has not been getting up out of bed and [facility] staff indicated he needed more medical care and assist than they can provide.  His sister also said she could not take care of him any further.        OT Problem List: Decreased strength;Decreased activity tolerance;Impaired balance (sitting and/or standing);Decreased knowledge of use of DME or AE      OT Treatment/Interventions: Self-care/ADL training;Therapeutic exercise;Energy conservation;DME and/or AE instruction;Therapeutic activities;Patient/family education;Balance training    OT Goals(Current goals can be found in the care plan section) Acute Rehab OT Goals Patient Stated Goal: I will consider going to rehab OT Goal Formulation: With patient Time For Goal Achievement: 10/14/19 Potential to Achieve Goals: Fair ADL Goals Pt Will Perform  Lower Body Dressing: with set-up;with max assist;bed level Pt Will Transfer to Toilet: with set-up;bedside commode;with +2 assist;stand pivot transfer;with max assist Pt/caregiver will Perform Home Exercise Program: Increased strength;Both right and left upper extremity;With written HEP provided;With theraband  OT Frequency: Min 2X/week   Barriers to D/C:    pt's sister and HH not able to care for patient to meet his complicated medical needs.       Co-evaluation              AM-PAC OT "6 Clicks" Daily Activity     Outcome Measure Help from another person eating meals?: None Help from another person taking care of personal grooming?: None Help from another person toileting, which includes using toliet, bedpan, or urinal?: A Lot Help from another person bathing (including washing, rinsing, drying)?: A Lot Help from another person to put on and taking off regular upper body clothing?: A Little Help from another person to put on and taking off regular lower body clothing?: Total 6 Click Score: 16   End of Session  Nurse Communication: (assisted with rolling to side for sacral wound care from Wound nurse and then colostomy emptying with total assist)  Activity Tolerance: Patient tolerated treatment well Patient left: in bed;with call bell/phone within reach;with bed alarm set  OT Visit Diagnosis: Muscle weakness (generalized) (M62.81);Other abnormalities of gait and mobility (R26.89)                Time: 0915-1000 OT Time Calculation (min): 45 min Charges:  OT General Charges $OT Visit: 1 Visit OT Evaluation $OT Eval Moderate Complexity: 1 Mod OT Treatments $Self Care/Home Management : 23-37 mins  Chrys Racer, OTR/L, [place] ascom [phone] 10/14/19, 10:35 AM

## 2019-10-14 NOTE — Progress Notes (Signed)
[facility]  ROUNDING NOTE   Subjective:   Sister in law at bedside. She is concerned that patient seems to have lost his hearing.    Creatinine 3.68 (3.99) (4.17)  PRBC transfusion yesterday  He states his diarrhea has resolved.   Sodium bicarbonate at 46m/hr  Objective:  Vital signs in last 24 hours:  Temp:  [97.6 F (36.4 C)-98.1 F (36.7 C)] 97.8 F (36.6 C) (03/15 0807) Pulse Rate:  [92-113] 92 (03/15 0807) Resp:  [17-20] 18 (03/15 0807) BP: (84-105)/(58-67) 103/67 (03/15 0807) SpO2:  [100 %] 100 % (03/15 0807) Weight:  [88.5 kg] 88.5 kg (03/15 0412)  Weight change: 1.27 kg Filed Weights   10/12/19 0200 10/13/19 0456 10/14/19 0412  Weight: 83.4 kg 87.2 kg 88.5 kg    Intake/Output: I/O last 3 completed shifts: In: 1407.5 [P.O.:357; I.V.:522.7; Blood:390; IV Piggyback:137.8] Out: 4300 [Urine:3500; Stool:800]   Intake/Output this shift:  Total I/O In: -  Out: 300 [Stool:300]  Physical Exam: General: NAD, laying in bed  Head: Normocephalic, atraumatic. Moist oral mucosal membranes  Eyes: Anicteric, PERRL  Neck: Supple, trachea midline  Lungs:  Clear to auscultation  Heart: Regular rate and rhythm  Abdomen:  Soft, nontender,   Extremities:  no peripheral edema.  Neurologic: Nonfocal, moving all four extremities   Skin Sacral decub, covered and clean       Basic Metabolic Panel: Recent Labs  Lab 10/12/19 0821 10/12/19 1216 10/13/19 0702 10/13/19 0702 10/13/19 1154 10/13/19 1154 10/13/19 1923 10/14/19 0005 10/14/19 0552  NA 142   < > 141  --  142  --  142 143 143  K 3.6   < > 3.3*  --  3.8  --  3.7 3.6 3.9  CL 107   < > 104  --  104  --  104 103 103  CO2 18*   < > 22  --  24  --  26 25 26   GLUCOSE 90   < > 138*  --  118*  --  124* 122* 111*  BUN 69*   < > 62*  --  61*  --  59* 62* 59*  CREATININE 4.42*   < > 3.99*  --  3.91*  --  3.83* 3.95* 3.68*  CALCIUM 7.7*   < > 7.7*   < > 7.8*   < > 7.6* 7.5* 7.6*  MG 1.8  --   --   --    --   --   --   --   --    < > = values in this interval not displayed.    Liver Function Tests: Recent Labs  Lab 10/11/19 0933  AST 19  ALT 15  ALKPHOS 88  BILITOT 0.9  PROT 6.2*  ALBUMIN 2.1*   No results for input(s): LIPASE, AMYLASE in the last 168 hours. No results for input(s): AMMONIA in the last 168 hours.  CBC: Recent Labs  Lab 10/11/19 0933 10/12/19 0821 10/13/19 0702 10/13/19 1923 10/14/19 0552  WBC 12.0* 10.3 9.9  --  9.7  NEUTROABS 9.9*  --   --   --   --   HGB 8.5* 8.0* 7.1* 8.2* 7.9*  HCT 27.8* 26.1* 22.9* 26.3* 25.7*  MCV 86.9 87.3 86.4  --  87.4  PLT 109* 82* 61*  --  41*    Cardiac Enzymes: No results for input(s): CKTOTAL, CKMB, CKMBINDEX, TROPONINI in the last 168 hours.  BNP: Invalid input(s): POCBNP  CBG: No results for  input(s): GLUCAP in the last 168 hours.  Microbiology: Results for orders placed or performed during the hospital encounter of 10/11/19  C Difficile Quick Screen w PCR reflex     Status: None   Collection Time: 10/11/19  9:33 AM   Specimen: STOOL  Result Value Ref Range Status   C Diff antigen NEGATIVE NEGATIVE Final   C Diff toxin NEGATIVE NEGATIVE Final   C Diff interpretation No C. difficile detected.  Final    Comment: Performed at [facility], [address] [place], [place] [address]  Culture, blood (Routine X 2) w Reflex to ID Panel     Status: None (Preliminary result)   Collection Time: 10/11/19  4:34 PM   Specimen: BLOOD RIGHT HAND  Result Value Ref Range Status   Specimen Description BLOOD RIGHT HAND  Final   Special Requests   Final    BOTTLES DRAWN AEROBIC ONLY Blood Culture results may not be optimal due to an inadequate volume of blood received in culture bottles   Culture   Final    NO GROWTH 3 DAYS Performed at [facility], [address], [place],  [address]    Report Status PENDING  Incomplete    Coagulation Studies: Recent Labs    10/11/19 2105-10-14  LABPROT 16.6*   INR 1.4*    Urinalysis: Recent Labs    10/12/19 1145  COLORURINE YELLOW*  LABSPEC 1.011  PHURINE 5.0  GLUCOSEU NEGATIVE  HGBUR NEGATIVE  BILIRUBINUR NEGATIVE  KETONESUR NEGATIVE  PROTEINUR 100*  NITRITE NEGATIVE  LEUKOCYTESUR LARGE*      Imaging: No results found.   Medications:   . piperacillin-tazobactam (ZOSYN)  IV 3.375 g (10/13/19 10-14-40)  .  sodium bicarbonate (isotonic) infusion in sterile water 50 mL/hr at 10/13/19 0700   . ascorbic acid  500 mg Oral BID  . atorvastatin  80 mg Oral Daily  . balsalazide  750 mg Oral TID  . Chlorhexidine Gluconate Cloth  6 each Topical Daily  . collagenase  1 application Topical Daily  . digoxin  0.0625 mg Oral Q48H  . feeding supplement (NEPRO CARB STEADY)  237 mL Oral TID BM  . finasteride  5 mg Oral Daily  . influenza vaccine adjuvanted  0.5 mL Intramuscular Tomorrow-1000  . lactobacillus  1 g Oral TID WC  . loperamide  2 mg Oral Q8H  . midodrine  10 mg Oral TID  . multivitamin with minerals  1 tablet Oral Daily  . pantoprazole  40 mg Oral Daily  . tamsulosin  0.4 mg Oral QPC breakfast  . zinc sulfate  220 mg Oral Daily   acetaminophen **OR** acetaminophen, ipratropium-albuterol, ondansetron (ZOFRAN) IV  Assessment/ Plan:  [name] is a 69 y.o. white male  with diabetes mellitus type II, hypertension, obstructive sleep apnea, congestive heart failure, atrial fibrillation, hyperlipidemia, Crohn's disease, coronary artery disease, GERD, pacemaker,who was admitted to [facility] on 10/11/2019 for Diarrhea of infectious origin [A09] AKI (acute kidney injury) ([place]) [N17.9]  1. Acute renal failure with metabolic acidosis: with baseline creatinine of 0.82, GFR > 60 on 10/01/19.  with underlying diabetic nephropathy with glucosuria and proteinuria.  Secondary to diarrhea and prerenal azotemia.  Renal ultrasound negative. No IV contrast exposure.  Nonoliguric urine output. No indication for dialysis.   - Continue IV fluids  bicarb 32m/hr  2. Hypotension: holding blood pressure medications.  Placed on digoxin by cardiology for atrial fibrillation.  - midodrine - if blood pressure is too low,  will discontinue tamsulosin.   3. Anemia with renal failure and thrombocytopenia: hemoglobin 7.9 Status post PRBC transfusion on 3/15. With bleeding from sacral wound.   4. Thrombocytopenia: off heparin infusion. Stopped linezolid.     LOS: 3 Jaeven Wanzer 3/15/202112:29 PM

## 2019-10-14 NOTE — Care Management Important Message (Signed)
Important Message  Patient Details  Name: Colton Lamb MRN: [id number] Date of Birth: [birth year]   Medicare Important Message Given:  Yes  Reviewed with patient via room phone due to isolation.  Aware of right.    Dannette Barbara 10/14/2019, 1:00 PM

## 2019-10-14 NOTE — Progress Notes (Signed)
PT Cancellation Note  Patient Details Name: Colton Lamb MRN: [id number] DOB: [birth year]   Cancelled Treatment:    Reason Eval/Treat Not Completed: PT screened, no needs identified, will sign off. Pt familiar to our services from multiple recent prior admissions. Pt is bed-bound, nonambulatory, and total care at baseline despite several months of rehabilitation. Pt has no acute rehabilitative needs at this time. Pt will be able to participate in his typical daily mobility regimen here with nursing. Rehab has consistently recommended 24/7 care for this patient at other admissions due to pt's complex medical and mobility needs, however he elected to return to home prior admission. Pt is a good candidate for long-term-care if he is agreeable. PT signing off at this time. Thank you for this consult.    9:28 AM, 10/14/19 Etta Grandchild, PT, DPT Physical Therapist - [facility]  [phone] ([place])   [place] C 10/14/2019, 9:23 AM

## 2019-10-14 NOTE — Progress Notes (Signed)
Progress Note  Patient Name: Colton Lamb Date of Encounter: 10/14/2019  Primary Cardiologist: Ida Rogue, MD   Subjective   He denies chest pain.  Ventricular rate is more controlled.  Inpatient Medications    Scheduled Meds: . ascorbic acid  500 mg Oral BID  . atorvastatin  80 mg Oral Daily  . balsalazide  750 mg Oral TID  . Chlorhexidine Gluconate Cloth  6 each Topical Daily  . collagenase  1 application Topical Daily  . digoxin  0.0625 mg Oral Q48H  . feeding supplement (NEPRO CARB STEADY)  237 mL Oral TID BM  . finasteride  5 mg Oral Daily  . influenza vaccine adjuvanted  0.5 mL Intramuscular Tomorrow-1000  . lactobacillus  1 g Oral TID WC  . linezolid  600 mg Oral BID  . loperamide  2 mg Oral Q8H  . midodrine  10 mg Oral TID  . multivitamin with minerals  1 tablet Oral Daily  . pantoprazole  40 mg Oral Daily  . tamsulosin  0.4 mg Oral QPC breakfast  . zinc sulfate  220 mg Oral Daily   Continuous Infusions: . piperacillin-tazobactam (ZOSYN)  IV 3.375 g (10/13/19 2142)  .  sodium bicarbonate (isotonic) infusion in sterile water 50 mL/hr at 10/13/19 0700   PRN Meds: acetaminophen **OR** acetaminophen, ipratropium-albuterol, ondansetron (ZOFRAN) IV   Vital Signs    Vitals:   10/13/19 1840 10/13/19 2022 10/14/19 0412 10/14/19 0807  BP: 105/67 105/66 (!) 94/58 103/67  Pulse:  (!) 113 92 92  Resp: 18 20 20 18   Temp: 98.1 F (36.7 C) 98 F (36.7 C) 98 F (36.7 C) 97.8 F (36.6 C)  TempSrc: Oral Oral Oral Oral  SpO2:  100% 100% 100%  Weight:   88.5 kg   Height:        Intake/Output Summary (Last 24 hours) at 10/14/2019 0836 Last data filed at 10/14/2019 0807 Gross per 24 hour  Intake 1357.51 ml  Output 1750 ml  Net -392.49 ml   Last 3 Weights 10/14/2019 10/13/2019 10/12/2019  Weight (lbs) 195 lb 192 lb 3.2 oz 183 lb 12.8 oz  Weight (kg) 88.451 kg 87.181 kg 83.371 kg      Telemetry    Atrial fibrillation, heart rate 99-110- Personally  Reviewed  ECG    No new tracing obtained- Personally Reviewed  Physical Exam   GEN: No acute distress.   Neck: No JVD Cardiac:  Irregular irregular, no murmurs, rubs, or gallops.  Respiratory:  Decreased breath sounds at bases GI: Soft, nontender, non-distended  MS: No edema Neuro:  Nonfocal  Psych: Normal affect   Labs    High Sensitivity Troponin:  No results for input(s): TROPONINIHS in the last 720 hours.    Chemistry Recent Labs  Lab 10/11/19 0933 10/12/19 0821 10/13/19 1923 10/14/19 0005 10/14/19 0552  NA 139   < > 142 143 143  K 3.9   < > 3.7 3.6 3.9  CL 107   < > 104 103 103  CO2 16*   < > 26 25 26   GLUCOSE 94   < > 124* 122* 111*  BUN 73*   < > 59* 62* 59*  CREATININE 4.64*   < > 3.83* 3.95* 3.68*  CALCIUM 7.9*   < > 7.6* 7.5* 7.6*  PROT 6.2*  --   --   --   --   ALBUMIN 2.1*  --   --   --   --   AST 19  --   --   --   --  ALT 15  --   --   --   --   ALKPHOS 88  --   --   --   --   BILITOT 0.9  --   --   --   --   GFRNONAA 12*   < > 15* 15* 16*  GFRAA 14*   < > 18* 17* 18*  ANIONGAP 16*   < > 12 15 14    < > = values in this interval not displayed.     Hematology Recent Labs  Lab 10/12/19 0821 10/12/19 0821 10/13/19 0702 10/13/19 1923 10/14/19 0552  WBC 10.3  --  9.9  --  9.7  RBC 2.99*  --  2.65*  --  2.94*  HGB 8.0*   < > 7.1* 8.2* 7.9*  HCT 26.1*   < > 22.9* 26.3* 25.7*  MCV 87.3  --  86.4  --  87.4  MCH 26.8  --  26.8  --  26.9  MCHC 30.7  --  31.0  --  30.7  RDW 22.8*  --  22.9*  --  21.3*  PLT 82*  --  61*  --  41*   < > = values in this interval not displayed.    BNP Recent Labs  Lab 10/11/19 0933  BNP 1,655.0*     DDimer No results for input(s): DDIMER in the last 168 hours.   Radiology    No results found.  Cardiac Studies   TTE 09/2019 1. Left ventricular ejection fraction, by estimation, is 25 to 30%. The  left ventricle has severely decreased function. The left ventricle  demonstrates regional wall motion  abnormalities , anterior , anterospetal  and apical wall hypokinesis.  2. Right ventricular systolic function is normal. The right ventricular  size is normal. There is mildly elevated pulmonary artery systolic  pressure.  3. Left atrial size was mildly dilated.  4. No vegetation noted.   Patient Profile     69 y.o. 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 Atrial Fibrillation with rapid ventricular response -Ventricular rate is better controlled with treatment of underlying infection.  Not able to use a beta-blocker or calcium channel blocker due to hypotension.  He is on very low-dose digoxin for the lack of better options. -The dose is renally adjusted but managing this long-term might be challenging.  I requested digoxin level for tomorrow. The patient is not a candidate for long-term anticoagulation given severe underlying anemia and intermittent bleeding from his stage IV sacral decubitus.   2. Heart failure reduced ejection fraction, last EF 25 to 30% status post ICD. -Low blood pressures preventing addition of heart failure therapy -Continue dig and midodrine as mentioned above for pressure support  3. Decubitus ulcer, sepsis, infection -abx as per primary team  4.  Acute on chronic renal failure.  Creatinine today is 3.68.  Followed by nephrology.  The patient's overall prognosis is extremely poor due to multiple comorbidities with multiple admissions over the last year.  He is not able to take care of himself at home.  Consider palliative care consult.    Signed, Kathlyn Sacramento, MD  10/14/2019, 8:36 AM

## 2019-10-14 NOTE — Consult Note (Signed)
WOC Nurse Consult Note: Reason for Consult: pressure injury/ostomy Wound type: Stage 4 Pressure Injury Pressure Injury POA: Yes Measurement: 6cm x 9.5cm x 3.0cm Wound bed:100% clean, minimal fibrinous material  Drainage (amount, consistency, odor) moderate, yellow, no odor Periwound: intact, severe epibole of the wound edges  Dressing procedure/placement/frequency: Chronic non healing pressure injury: saline moist dressings BID, cover with ABD pad, secure with tape.   [place] Nurse ostomy consult note Stoma type/location: LLQ, end colostomy Stomal assessment/size: pouch intact  Peristomal assessment: pouch intact Treatment options for stomal/peristomal skin: NA Output liquid brown with form stool  Ostomy pouching: 2pc.  Patient is dependent on ostomy care Flex convex and regular convex pouches ordered, reported to have some leakage. Requested unit secretary to place in patient's room. Will add air mattress as well.   Discussed POC with patient and bedside nurse.  Re consult if needed, will not follow at this time. Thanks  Traveion Ruddock [employer], RN,CWOCN, CNS, [place] [phone])

## 2019-10-14 NOTE — Consult Note (Signed)
Pharmacy Antibiotic Note  Colton Lamb is a 69 y.o. male admitted on 10/11/2019 with infected sacral decubitus ulcer. Patient with acute renal failure with Scr 4.64 (baseline 0.7-0.8). Recently admitted 2/22-3/3 for sepsis secondary to Pseudomonas bacteremia and osteomyelitis. He is on outpatient antibiotics including pip/tazo 13.5 g over 24h as a continuous infusion (planned stop date 3/22) and linezolid 600 mg q12h (planned stop date 3/15). Patient came in with pip/tazo infusion running now stopped. Pharmacy has been consulted for pip/tazo dosing. Linezolid has been resumed.  2/25 wound cultures with: E faecium, PSA, and proteus  Patient currently afebrile. WBC 12. Blood cultures pending.   Plan: Continue pip/tazo 3.375 mg q12H until 3/22 and linezolid d/c'ed by MD.   Height: 5' 11"  (180.3 cm) Weight: 195 lb (88.5 kg) IBW/kg (Calculated) : 75.3  Temp (24hrs), Avg:97.9 F (36.6 C), Min:97.6 F (36.4 C), Max:98.1 F (36.7 C)  Recent Labs  Lab 10/11/19 0933 10/11/19 0933 10/11/19 2315 10/12/19 0208 10/12/19 0821 10/12/19 1216 10/13/19 0702 10/13/19 1154 10/13/19 1923 10/14/19 0005 10/14/19 0552  WBC 12.0*  --   --   --  10.3  --  9.9  --   --   --  9.7  CREATININE 4.64*   < >  --   --  4.42*   < > 3.99* 3.91* 3.83* 3.95* 3.68*  LATICACIDVEN  --   --  1.5 1.6  --   --   --   --   --   --   --    < > = values in this interval not displayed.    Estimated Creatinine Clearance: 20.5 mL/min (A) (by C-G formula based on SCr of 3.68 mg/dL (H)).    Allergies  Allergen Reactions  . Iodine Other (See Comments)    Shortness of breath, swelling and hives  . Shrimp [Shellfish Allergy] Other (See Comments)    SWELLING    HIVES    SHORTNESS OF BREATH  . Tetracycline Rash    Antimicrobials this admission: Linezolid 3/12 >> 3/14 Pip/tazo >>   Dose adjustments this admission: n/a  Microbiology results: 3/12 BCx: pending 3/12 C diff: negative 3/12 GI panel: pending   Thank  you for allowing pharmacy to be a part of this patient's care.  Oswald Hillock, PharmD, BCPS 10/14/2019 9:03 AM

## 2019-10-15 DIAGNOSIS — I4819 Other persistent atrial fibrillation: Secondary | ICD-10-CM

## 2019-10-15 DIAGNOSIS — D696 Thrombocytopenia, unspecified: Secondary | ICD-10-CM

## 2019-10-15 DIAGNOSIS — D649 Anemia, unspecified: Secondary | ICD-10-CM

## 2019-10-15 DIAGNOSIS — N179 Acute kidney failure, unspecified: Principal | ICD-10-CM

## 2019-10-15 LAB — BASIC METABOLIC PANEL
Anion gap: 10 (ref 5–15)
BUN: 55 mg/dL — ABNORMAL HIGH (ref 8–23)
CO2: 33 mmol/L — ABNORMAL HIGH (ref 22–32)
Calcium: 7.4 mg/dL — ABNORMAL LOW (ref 8.9–10.3)
Chloride: 101 mmol/L (ref 98–111)
Creatinine, Ser: 3.36 mg/dL — ABNORMAL HIGH (ref 0.61–1.24)
GFR calc Af Amer: 21 mL/min — ABNORMAL LOW (ref >60)
GFR calc non Af Amer: 18 mL/min — ABNORMAL LOW (ref >60)
Glucose, Bld: 113 mg/dL — ABNORMAL HIGH (ref 70–99)
Potassium: 3.5 mmol/L (ref 3.5–5.1)
Sodium: 144 mmol/L (ref 135–145)

## 2019-10-15 LAB — CBC
HCT: 23.1 % — ABNORMAL LOW (ref 39.0–52.0)
Hemoglobin: 7.3 g/dL — ABNORMAL LOW (ref 13.0–17.0)
MCH: 27.8 pg (ref 26.0–34.0)
MCHC: 31.6 g/dL (ref 30.0–36.0)
MCV: 87.8 fL (ref 80.0–100.0)
Platelets: 36 10*3/uL — ABNORMAL LOW (ref 150–400)
RBC: 2.63 MIL/uL — ABNORMAL LOW (ref 4.22–5.81)
RDW: 20.6 % — ABNORMAL HIGH (ref 11.5–15.5)
WBC: 10.6 10*3/uL — ABNORMAL HIGH (ref 4.0–10.5)
nRBC: 0 % (ref 0.0–0.2)

## 2019-10-15 LAB — DIGOXIN LEVEL: Digoxin Level: 0.7 ng/mL — ABNORMAL LOW (ref 0.8–2.0)

## 2019-10-15 MED ORDER — PIPERACILLIN-TAZOBACTAM 3.375 G IVPB
3.3750 g | Freq: Three times a day (TID) | INTRAVENOUS | Status: DC
  Administered 2019-10-15 (×2): 3.375 g via INTRAVENOUS
  Filled 2019-10-15 (×2): qty 50

## 2019-10-15 MED ORDER — METRONIDAZOLE IN NACL 5-0.79 MG/ML-% IV SOLN
500.0000 mg | Freq: Three times a day (TID) | INTRAVENOUS | Status: DC
  Administered 2019-10-15 – 2019-10-21 (×18): 500 mg via INTRAVENOUS
  Filled 2019-10-15 (×19): qty 100

## 2019-10-15 MED ORDER — CIPROFLOXACIN IN D5W 400 MG/200ML IV SOLN
400.0000 mg | INTRAVENOUS | Status: DC
  Administered 2019-10-15 – 2019-10-16 (×2): 400 mg via INTRAVENOUS
  Filled 2019-10-15 (×3): qty 200

## 2019-10-15 MED ORDER — PIPERACILLIN-TAZOBACTAM 3.375 G IVPB: 3.3750 g | Freq: Three times a day (TID) | INTRAVENOUS | Status: DC

## 2019-10-15 NOTE — Progress Notes (Signed)
Progress Note  Patient Name: Colton Lamb Date of Encounter: 10/15/2019  Primary Cardiologist: Colton Rogue, MD   Subjective   No complaints; denies chest pain, shortness of breath, and edema.  Diarrhea has resolved.  Reports that he is making urine.  Inpatient Medications    Scheduled Meds: . ascorbic acid  500 mg Oral BID  . atorvastatin  80 mg Oral Daily  . balsalazide  750 mg Oral TID  . Chlorhexidine Gluconate Cloth  6 each Topical Daily  . collagenase  1 application Topical Daily  . digoxin  0.0625 mg Oral Q48H  . feeding supplement (NEPRO CARB STEADY)  237 mL Oral TID BM  . finasteride  5 mg Oral Daily  . influenza vaccine adjuvanted  0.5 mL Intramuscular Tomorrow-1000  . lactobacillus  1 g Oral TID WC  . loperamide  2 mg Oral Q8H  . midodrine  10 mg Oral TID  . multivitamin with minerals  1 tablet Oral Daily  . pantoprazole  40 mg Oral Daily  . tamsulosin  0.4 mg Oral QPC breakfast  . zinc sulfate  220 mg Oral Daily   Continuous Infusions: . piperacillin-tazobactam (ZOSYN)  IV    .  sodium bicarbonate (isotonic) infusion in sterile water 50 mL/hr at 10/15/19 0104   PRN Meds: acetaminophen **OR** acetaminophen, ipratropium-albuterol, ondansetron (ZOFRAN) IV   Vital Signs    Vitals:   10/14/19 1646 10/14/19 1727 10/14/19 1915 10/15/19 0351  BP: 107/66 111/65 112/70 109/62  Pulse: 93 96 96 99  Resp: 18     Temp: 97.7 F (36.5 C) 97.7 F (36.5 C) 98.3 F (36.8 C) 97.8 F (36.6 C)  TempSrc: Oral Oral Oral Oral  SpO2: 99% 100% 100% 100%  Weight:    80.3 kg  Height:        Intake/Output Summary (Last 24 hours) at 10/15/2019 0811 Last data filed at 10/15/2019 0540 Gross per 24 hour  Intake 1981.57 ml  Output 2400 ml  Net -418.43 ml   Last 3 Weights 10/15/2019 10/14/2019 10/13/2019  Weight (lbs) 177 lb 195 lb 192 lb 3.2 oz  Weight (kg) 80.287 kg 88.451 kg 87.181 kg      Telemetry    Atrial fibrillation with ventricular rates 90-110 - Personally  Reviewed  ECG    No ne tracing.  Physical Exam   GEN: Chronically ill-appearing man, lying in bed. Neck: Unable to assess JVP due to patient positioning. Cardiac: Irregularly irregular with 1/6 systolic murmur. Respiratory: Mildly diminished breath sounds throughout. GI: Soft, nontender, non-distended  MS: No edema; No deformity. Neuro:  Nonfocal  Psych: Flat affect.  Labs    High Sensitivity Troponin:  No results for input(s): TROPONINIHS in the last 720 hours.    Chemistry Recent Labs  Lab 10/11/19 0933 10/12/19 0821 10/14/19 0005 10/14/19 0552 10/15/19 0433  NA 139   < > 143 143 144  K 3.9   < > 3.6 3.9 3.5  CL 107   < > 103 103 101  CO2 16*   < > 25 26 33*  GLUCOSE 94   < > 122* 111* 113*  BUN 73*   < > 62* 59* 55*  CREATININE 4.64*   < > 3.95* 3.68* 3.36*  CALCIUM 7.9*   < > 7.5* 7.6* 7.4*  PROT 6.2*  --   --   --   --   ALBUMIN 2.1*  --   --   --   --   AST 19  --   --   --   --  ALT 15  --   --   --   --   ALKPHOS 88  --   --   --   --   BILITOT 0.9  --   --   --   --   GFRNONAA 12*   < > 15* 16* 18*  GFRAA 14*   < > 17* 18* 21*  ANIONGAP 16*   < > 15 14 10    < > = values in this interval not displayed.     Hematology Recent Labs  Lab 10/13/19 0702 10/13/19 0702 10/13/19 1923 10/14/19 0552 10/15/19 0433  WBC 9.9  --   --  9.7 10.6*  RBC 2.65*  --   --  2.94* 2.63*  HGB 7.1*   < > 8.2* 7.9* 7.3*  HCT 22.9*   < > 26.3* 25.7* 23.1*  MCV 86.4  --   --  87.4 87.8  MCH 26.8  --   --  26.9 27.8  MCHC 31.0  --   --  30.7 31.6  RDW 22.9*  --   --  21.3* 20.6*  PLT 61*  --   --  41* 36*   < > = values in this interval not displayed.    BNP Recent Labs  Lab 10/11/19 0933  BNP 1,655.0*     DDimer No results for input(s): DDIMER in the last 168 hours.   Radiology    No results found.  Cardiac Studies   TTE (09/28/19): 1. Left ventricular ejection fraction, by estimation, is 25 to 30%. The  left ventricle has severely decreased function.  The left ventricle  demonstrates regional wall motion abnormalities , anterior , anterospetal  and apical wall hypokinesis.  2. Right ventricular systolic function is normal. The right ventricular  size is normal. There is mildly elevated pulmonary artery systolic  pressure.  3. Left atrial size was mildly dilated.  4. No vegetation noted.   Patient Profile     69 y.o. male with acute on chronic HFrEF s/p ICD, multivessel CAD s/p PCI, persistent a-fib complicated by chronic hypotension, sacral decubitus ulcer with chronic osteomyelitis, Chron's disease s/p diverting colostomy to promote healing of sacral wound, whom we are following due to atrial fibrillation with rapid ventricular response in the setting of diarrhea.  Assessment & Plan    Persistent atrial fibrillation: Ventricular rates reasonable (90-110 bpm) on very low-dose digoxin.  Digoxin level today was 0.7.  Hypotension and low LVEF have precluded use of beta-blockers and non-dihydropyridine calcium channel blockers.  Anticoagulation also on hold in the setting of severe anemia and worsening thrombocytopenia.  Continue every other day digoxin at current dose; digoxin level of 0.7 today is reasonable, though we will need to monitor dosing closely in the setting of AKI.  No anticoagulation with severe anemia and worsening thrombocytopenia.  Chronic HFrEF: Colton Lamb appears euvolemic on exam today.  Continue gentle hydration per nephrology.  Continue low-dose digoxin, as above.  Patient is not on BB or ACEI/ARB/AA in the setting of hypotension and acute kidney injury.  Acute kidney injury: Creatinine slightly improved today compared with yesterday.  Continue gentle hydration with close monitoring of volume status.  Avoid nephrotoxic medications.  Nephrology following.  Anemia and thrombocytopenia:  Likely multifactorial.  Avoid anticoagulation and antiplatelet therapy.  Disposition:  I agree with  recommendations for palliative care consultation, as prognosis is guarded in the setting of multiorgan dysfunction.     For questions or updates, please contact [place] Please  consult [email or web address] for contact info under [facility].    Signed, Colton Bush, MD  10/15/2019, 8:11 AM

## 2019-10-15 NOTE — Progress Notes (Signed)
[facility]  ROUNDING NOTE   Subjective:   No complaints. Claims to be eating well.   Creatinine 3.36 (3.68) (3.95)  Sodium bicarbonate at 47m/hr  Objective:  Vital signs in last 24 hours:  Temp:  [97.7 F (36.5 C)-98.3 F (36.8 C)] 97.9 F (36.6 C) (03/16 0835) Pulse Rate:  [93-99] 93 (03/16 0835) Resp:  [17-18] 17 (03/16 0835) BP: (102-112)/(62-70) 102/68 (03/16 0835) SpO2:  [98 %-100 %] 98 % (03/16 0835) Weight:  [80.3 kg] 80.3 kg (03/16 0351)  Weight change: -8.165 kg Filed Weights   10/13/19 0456 10/14/19 0412 10/15/19 0351  Weight: 87.2 kg 88.5 kg 80.3 kg    Intake/Output: I/O last 3 completed shifts: In: 1981.6 [P.O.:360; I.V.:1551.9; IV Piggyback:69.7] Out: 3200 [Urine:2700; Stool:500]   Intake/Output this shift:  No intake/output data recorded.  Physical Exam: General: NAD, laying in bed  Head: Normocephalic, atraumatic. Moist oral mucosal membranes  Eyes: Anicteric, PERRL  Neck: Supple, trachea midline  Lungs:  Clear to auscultation  Heart: Regular rate and rhythm  Abdomen:  Soft, nontender,   Extremities:  no peripheral edema.  Neurologic: Nonfocal, moving all four extremities   Skin Sacral decub, covered and clean       Basic Metabolic Panel: Recent Labs  Lab 10/12/19 0821 10/12/19 1216 10/13/19 1154 10/13/19 1154 10/13/19 1923 10/13/19 1923 10/14/19 0005 10/14/19 0552 10/15/19 0433  NA 142   < > 142  --  142  --  143 143 144  K 3.6   < > 3.8  --  3.7  --  3.6 3.9 3.5  CL 107   < > 104  --  104  --  103 103 101  CO2 18*   < > 24  --  26  --  25 26 33*  GLUCOSE 90   < > 118*  --  124*  --  122* 111* 113*  BUN 69*   < > 61*  --  59*  --  62* 59* 55*  CREATININE 4.42*   < > 3.91*  --  3.83*  --  3.95* 3.68* 3.36*  CALCIUM 7.7*   < > 7.8*   < > 7.6*   < > 7.5* 7.6* 7.4*  MG 1.8  --   --   --   --   --   --   --   --    < > = values in this interval not displayed.    Liver Function Tests: Recent Labs  Lab  10/11/19 0933  AST 19  ALT 15  ALKPHOS 88  BILITOT 0.9  PROT 6.2*  ALBUMIN 2.1*   No results for input(s): LIPASE, AMYLASE in the last 168 hours. No results for input(s): AMMONIA in the last 168 hours.  CBC: Recent Labs  Lab 10/11/19 0933 10/11/19 0933 10/12/19 0821 10/13/19 0702 10/13/19 1923 10/14/19 0552 10/15/19 0433  WBC 12.0*  --  10.3 9.9  --  9.7 10.6*  NEUTROABS 9.9*  --   --   --   --   --   --   HGB 8.5*   < > 8.0* 7.1* 8.2* 7.9* 7.3*  HCT 27.8*   < > 26.1* 22.9* 26.3* 25.7* 23.1*  MCV 86.9  --  87.3 86.4  --  87.4 87.8  PLT 109*  --  82* 61*  --  41* 36*   < > = values in this interval not displayed.    Cardiac Enzymes: No results for input(s): CKTOTAL, CKMB,  CKMBINDEX, TROPONINI in the last 168 hours.  BNP: Invalid input(s): POCBNP  CBG: No results for input(s): GLUCAP in the last 168 hours.  Microbiology: Results for orders placed or performed during the hospital encounter of 10/11/19  GI pathogen panel by PCR, stool     Status: None   Collection Time: 10/11/19  9:33 AM   Specimen: STOOL  Result Value Ref Range Status   Plesiomonas shigelloides NOT DETECTED NOT DETECTED Final   Yersinia enterocolitica NOT DETECTED NOT DETECTED Final   Vibrio NOT DETECTED NOT DETECTED Final   Enteropathogenic E coli NOT DETECTED NOT DETECTED Final   E coli (ETEC) LT/ST NOT DETECTED NOT DETECTED Final   E coli 8937 by PCR Not applicable NOT DETECTED Final   Cryptosporidium by PCR NOT DETECTED NOT DETECTED Final   Entamoeba histolytica NOT DETECTED NOT DETECTED Final   Adenovirus F 40/41 NOT DETECTED NOT DETECTED Final   Norovirus GI/GII NOT DETECTED NOT DETECTED Final   Sapovirus NOT DETECTED NOT DETECTED Final    Comment: (NOTE) Performed At: [facility] [place], [place] [id number] Rush Farmer MD [id number]    Vibrio cholerae NOT DETECTED NOT DETECTED Final   Campylobacter by PCR NOT DETECTED NOT DETECTED Final   Salmonella by  PCR NOT DETECTED NOT DETECTED Final   E coli (STEC) NOT DETECTED NOT DETECTED Final   Enteroaggregative E coli NOT DETECTED NOT DETECTED Final   Shigella by PCR NOT DETECTED NOT DETECTED Final   Cyclospora cayetanensis NOT DETECTED NOT DETECTED Final   Astrovirus NOT DETECTED NOT DETECTED Final   G lamblia by PCR NOT DETECTED NOT DETECTED Final   Rotavirus A by PCR NOT DETECTED NOT DETECTED Final  C Difficile Quick Screen w PCR reflex     Status: None   Collection Time: 10/11/19  9:33 AM   Specimen: STOOL  Result Value Ref Range Status   C Diff antigen NEGATIVE NEGATIVE Final   C Diff toxin NEGATIVE NEGATIVE Final   C Diff interpretation No C. difficile detected.  Final    Comment: Performed at [facility], [address] [place], [place] [address]  Culture, blood (Routine X 2) w Reflex to ID Panel     Status: None (Preliminary result)   Collection Time: 10/11/19  4:34 PM   Specimen: BLOOD RIGHT HAND  Result Value Ref Range Status   Specimen Description BLOOD RIGHT HAND  Final   Special Requests   Final    BOTTLES DRAWN AEROBIC ONLY Blood Culture results may not be optimal due to an inadequate volume of blood received in culture bottles   Culture   Final    NO GROWTH 4 DAYS Performed at [facility], [address] [place], [place] [address]    Report Status PENDING  Incomplete    Coagulation Studies: No results for input(s): LABPROT, INR in the last 72 hours.  Urinalysis: Recent Labs    10/12/19 1145  COLORURINE YELLOW*  LABSPEC 1.011  PHURINE 5.0  GLUCOSEU NEGATIVE  HGBUR NEGATIVE  BILIRUBINUR NEGATIVE  KETONESUR NEGATIVE  PROTEINUR 100*  NITRITE NEGATIVE  LEUKOCYTESUR LARGE*      Imaging: No results found.   Medications:   . piperacillin-tazobactam (ZOSYN)  IV 3.375 g (10/15/19 0930)   . ascorbic acid  500 mg Oral BID  . atorvastatin  80 mg Oral Daily  . balsalazide  750 mg Oral TID  . Chlorhexidine Gluconate Cloth  6 each Topical  Daily  . collagenase  1 application Topical Daily  . digoxin  0.0625 mg Oral Q48H  . feeding supplement (NEPRO CARB STEADY)  237 mL Oral TID BM  . finasteride  5 mg Oral Daily  . influenza vaccine adjuvanted  0.5 mL Intramuscular Tomorrow-1000  . lactobacillus  1 g Oral TID WC  . loperamide  2 mg Oral Q8H  . midodrine  10 mg Oral TID  . multivitamin with minerals  1 tablet Oral Daily  . pantoprazole  40 mg Oral Daily  . tamsulosin  0.4 mg Oral QPC breakfast  . zinc sulfate  220 mg Oral Daily   acetaminophen **OR** acetaminophen, ipratropium-albuterol, ondansetron (ZOFRAN) IV  Assessment/ Plan:  [name] is a 69 y.o. white male  with diabetes mellitus type II, hypertension, obstructive sleep apnea, congestive heart failure, atrial fibrillation, hyperlipidemia, Crohn's disease, coronary artery disease, GERD, pacemaker,who was admitted to [facility] on 10/11/2019 for Diarrhea of infectious origin [A09] AKI (acute kidney injury) ([place]) [N17.9]  1. Acute renal failure with metabolic acidosis: with baseline creatinine of 0.82, GFR > 60 on 10/01/19.  with underlying diabetic nephropathy with glucosuria and proteinuria.  Secondary to diarrhea and prerenal azotemia.  Renal ultrasound negative. No IV contrast exposure.  Nonoliguric urine output. No indication for dialysis.   - Discontinue IV fluids. Encourage PO intake.   2. Hypotension: holding blood pressure medications.  Placed on digoxin by cardiology for atrial fibrillation.  - midodrine - if blood pressure is too low, will discontinue tamsulosin.   3. Anemia with renal failure and thrombocytopenia: hemoglobin 7.3 Status post PRBC transfusion on 3/15.  4. Thrombocytopenia: off heparin infusion. Stopped linezolid.  Discussed with pharmacy, platelets should start improving within 2 weeks.     LOS: 4 Colton Lamb 3/16/202111:10 AM

## 2019-10-15 NOTE — Consult Note (Signed)
Pharmacy Antibiotic Note  Colton Lamb is a 69 y.o. male admitted on 10/11/2019 with infected sacral decubitus ulcer. Patient with acute renal failure with Scr 4.64 (baseline 0.7-0.8). Recently admitted 2/22-3/3 for sepsis secondary to Pseudomonas bacteremia and osteomyelitis. He is on outpatient antibiotics including pip/tazo 13.5 g over 24h as a continuous infusion (planned stop date 3/22) and linezolid 600 mg q12h (planned stop date 3/15). Patient came in with pip/tazo infusion running now stopped. Both Zosyn and Linezolid have not been stopped. Pharmacy has been consulted for Ciprofloxacin dosing.   2/25 wound cultures with: E faecium, PSA, and proteus  Patient currently afebrile. WBC 10.6. Blood cultures NGTD. Per nephrology, no current need for dialysis.  Plan: Ciprofloxacin 4107m IV Q24 hours.  Height: 5' 11"  (180.3 cm) Weight: 177 lb (80.3 kg) IBW/kg (Calculated) : 75.3  Temp (24hrs), Avg:97.8 F (36.6 C), Min:97.4 F (36.3 C), Max:98 F (36.7 C)  Recent Labs  Lab 10/11/19 0933 10/11/19 0933 10/11/19 2315 10/12/19 0208 10/12/19 0821 10/12/19 1216 10/13/19 0702 10/13/19 0702 10/13/19 1154 10/13/19 1923 10/14/19 0005 10/14/19 0552 10/15/19 0433  WBC 12.0*  --   --   --  10.3  --  9.9  --   --   --   --  9.7 10.6*  CREATININE 4.64*   < >  --   --  4.42*   < > 3.99*   < > 3.91* 3.83* 3.95* 3.68* 3.36*  LATICACIDVEN  --   --  1.5 1.6  --   --   --   --   --   --   --   --   --    < > = values in this interval not displayed.    Estimated Creatinine Clearance: 22.4 mL/min (A) (by C-G formula based on SCr of 3.36 mg/dL (H)).    Allergies  Allergen Reactions  . Iodine Other (See Comments)    Shortness of breath, swelling and hives  . Shrimp [Shellfish Allergy] Other (See Comments)    SWELLING    HIVES    SHORTNESS OF BREATH  . Tetracycline Rash    Antimicrobials this admission: Ciprofloxacin 3/16 >> Linezolid 3/12 >> 3/14 Pip/tazo >> 3/16  Dose adjustments  this admission: n/a  Microbiology results: 3/12 BCx: NGTD 3/12 C diff: negative 3/12 GI panel: Sapovirus   Thank you for allowing pharmacy to be a part of this patient's care.  WPearla Dubonnet PharmD 10/15/2019 9:52 PM

## 2019-10-15 NOTE — Plan of Care (Signed)

## 2019-10-15 NOTE — Consult Note (Signed)
PHARMACY NOTE:  ANTIMICROBIAL RENAL DOSAGE ADJUSTMENT  Current antimicrobial regimen includes a mismatch between antimicrobial dosage and estimated renal function.  As per policy approved by the Pharmacy & Therapeutics and Medical Executive Committees, the antimicrobial dosage will be adjusted accordingly.  Current antimicrobial dosage:  Pip/tazo 3.375 q12H  Indication: chronic osteomyelitis  Renal Function:  Estimated Creatinine Clearance: 22.4 mL/min (A) (by C-G formula based on SCr of 3.36 mg/dL (H)).    Antimicrobial dosage has been changed to:  Pip/tazo q8H   Thank you for allowing pharmacy to be a part of this patient's care.  Oswald Hillock, [facility] 10/15/2019 7:32 AM

## 2019-10-15 NOTE — Progress Notes (Signed)
[facility] INFECTIOUS DISEASE PROGRESS NOTE Date of Admission:  10/11/2019     ID: BRECCAN GALANT is a 69 y.o. male with Pseudomonal bacteremia and chronic sacral ulcer Principal Problem:   Acute renal failure (ARF) ([place]) Active Problems:   Hyperlipidemia   CAD S/P percutaneous coronary angioplasty - multiple PCIs   Atrial fibrillation (HCC)   GERD   CROHN'S DISEASE-LARGE & SMALL INTESTINE   Hypertension, essential   Chronic systolic CHF (congestive heart failure) (HCC)   Normocytic anemia   Chronic osteomyelitis ([place])   Colostomy in place ([place])   Bacteremia due to Pseudomonas   Diarrhea   COVID-19 virus infection   Thrombocytopenia (HCC)   Sacral decubitus ulcer, stage IV (HCC)   Subjective: Patient previously followed while hospitalized.  Now readmitted with acute renal failure and diarrhea.  He had been discharged on linezolid and Zosyn.  On admission he had slightly elevated white count but new thrombocytopenia.  The thrombocytopenia has worsened.  Yesterday when I was called regarding this we advised stop the linezolid and Zosyn as possible causes of the thrombocytopenia.  I started him on ciprofloxacin and Flagyl.  He is seen this morning.  He reports that his diarrhea is resolved.  His tells me that his wound is improving.  He says he was readmitted because he could not be cared for at home but is not sure of the ongoing plan.  ROS  Eleven systems are reviewed and negative except per hpi  Medications:  Antibiotics Given (last 72 hours)    Date/Time Action Medication Dose Rate   10/12/19 2143 New Bag/Given   piperacillin-tazobactam (ZOSYN) IVPB 3.375 g 3.375 g 12.5 mL/hr   10/12/19 2145 Given   linezolid (ZYVOX) tablet 600 mg 600 mg    10/13/19 0945 New Bag/Given   piperacillin-tazobactam (ZOSYN) IVPB 3.375 g 3.375 g 12.5 mL/hr   10/13/19 0946 Given   linezolid (ZYVOX) tablet 600 mg 600 mg    10/13/19 2138 Given   linezolid (ZYVOX) tablet 600 mg 600 mg    10/13/19 2142 New Bag/Given   piperacillin-tazobactam (ZOSYN) IVPB 3.375 g 3.375 g 12.5 mL/hr   10/14/19 1423 New Bag/Given   piperacillin-tazobactam (ZOSYN) IVPB 3.375 g 3.375 g 12.5 mL/hr   10/14/19 2243 New Bag/Given   piperacillin-tazobactam (ZOSYN) IVPB 3.375 g 3.375 g 12.5 mL/hr   10/15/19 0930 New Bag/Given   piperacillin-tazobactam (ZOSYN) IVPB 3.375 g 3.375 g 12.5 mL/hr   10/15/19 1445 New Bag/Given   piperacillin-tazobactam (ZOSYN) IVPB 3.375 g 3.375 g 12.5 mL/hr     . ascorbic acid  500 mg Oral BID  . atorvastatin  80 mg Oral Daily  . balsalazide  750 mg Oral TID  . Chlorhexidine Gluconate Cloth  6 each Topical Daily  . collagenase  1 application Topical Daily  . digoxin  0.0625 mg Oral Q48H  . feeding supplement (NEPRO CARB STEADY)  237 mL Oral TID BM  . finasteride  5 mg Oral Daily  . influenza vaccine adjuvanted  0.5 mL Intramuscular Tomorrow-1000  . lactobacillus  1 g Oral TID WC  . loperamide  2 mg Oral Q8H  . midodrine  10 mg Oral TID  . multivitamin with minerals  1 tablet Oral Daily  . pantoprazole  40 mg Oral Daily  . tamsulosin  0.4 mg Oral QPC breakfast  . zinc sulfate  220 mg Oral Daily    Objective: Vital signs in last 24 hours: Temp:  [97.4 F (36.3 C)-98 F (36.7 C)] 97.4  F (36.3 C) (03/16 1932) Pulse Rate:  [93-105] 96 (03/16 1933) Resp:  [17-20] 20 (03/16 1932) BP: (82-134)/(49-74) 97/62 (03/16 1933) SpO2:  [98 %-100 %] 100 % (03/16 1932) Weight:  [80.3 kg] 80.3 kg (03/16 0351) Physical Exam  Constitutional: chronically ill appearing HENT: anicteric Mouth/Throat: Oropharynx is clear and moist. No oropharyngeal exudate.  Cardiovascular: Normal rate, regular rhythm and normal heart sounds. Pulmonary/Chest: Effort normal and breath sounds normal. No respiratory distress. He has no wheezes.  Abdominal: Soft. Bowel sounds are normal. He exhibits no distension. There is no tenderness. Colostomy in place Lymphadenopathy:  He has no cervical  adenopathy.  Neurological: He is alert and oriented to person, place, and time.  Integumentary: per wound note 3/14 -  Wound type: Stage 4 Pressure Injury Pressure Injury POA: Yes Measurement: 6cm x 9.5cm x 3.0cm Wound bed:100% clean, minimal fibrinous material  Drainage (amount, consistency, odor) moderate, yellow, no odor Periwound: intact, severe epibole of the wound edges  Dressing procedure/placement/frequency: Chronic non healing pressure injury: saline moist dressings BID, cover with ABD pad, secure with tape.   Lab Results Recent Labs    10/14/19 0552 10/15/19 0433  WBC 9.7 10.6*  HGB 7.9* 7.3*  HCT 25.7* 23.1*  NA 143 144  K 3.9 3.5  CL 103 101  CO2 26 33*  BUN 59* 55*  CREATININE 3.68* 3.36*    Microbiology: Results for orders placed or performed during the hospital encounter of 10/11/19  GI pathogen panel by PCR, stool     Status: None   Collection Time: 10/11/19  9:33 AM   Specimen: STOOL  Result Value Ref Range Status   Plesiomonas shigelloides NOT DETECTED NOT DETECTED Final   Yersinia enterocolitica NOT DETECTED NOT DETECTED Final   Vibrio NOT DETECTED NOT DETECTED Final   Enteropathogenic E coli NOT DETECTED NOT DETECTED Final   E coli (ETEC) LT/ST NOT DETECTED NOT DETECTED Final   E coli 2703 by PCR Not applicable NOT DETECTED Final   Cryptosporidium by PCR NOT DETECTED NOT DETECTED Final   Entamoeba histolytica NOT DETECTED NOT DETECTED Final   Adenovirus F 40/41 NOT DETECTED NOT DETECTED Final   Norovirus GI/GII NOT DETECTED NOT DETECTED Final   Sapovirus NOT DETECTED NOT DETECTED Final    Comment: (NOTE) Performed At: [facility] [place], [place] [id number] Rush Farmer MD [id number]    Vibrio cholerae NOT DETECTED NOT DETECTED Final   Campylobacter by PCR NOT DETECTED NOT DETECTED Final   Salmonella by PCR NOT DETECTED NOT DETECTED Final   E coli (STEC) NOT DETECTED NOT DETECTED Final   Enteroaggregative E coli NOT  DETECTED NOT DETECTED Final   Shigella by PCR NOT DETECTED NOT DETECTED Final   Cyclospora cayetanensis NOT DETECTED NOT DETECTED Final   Astrovirus NOT DETECTED NOT DETECTED Final   G lamblia by PCR NOT DETECTED NOT DETECTED Final   Rotavirus A by PCR NOT DETECTED NOT DETECTED Final  C Difficile Quick Screen w PCR reflex     Status: None   Collection Time: 10/11/19  9:33 AM   Specimen: STOOL  Result Value Ref Range Status   C Diff antigen NEGATIVE NEGATIVE Final   C Diff toxin NEGATIVE NEGATIVE Final   C Diff interpretation No C. difficile detected.  Final    Comment: Performed at [facility], [address] [place], [place] [address]  Culture, blood (Routine X 2) w Reflex to ID Panel     Status: None (Preliminary  result)   Collection Time: 10/11/19  4:34 PM   Specimen: BLOOD RIGHT HAND  Result Value Ref Range Status   Specimen Description BLOOD RIGHT HAND  Final   Special Requests   Final    BOTTLES DRAWN AEROBIC ONLY Blood Culture results may not be optimal due to an inadequate volume of blood received in culture bottles   Culture   Final    NO GROWTH 4 DAYS Performed at [facility], [address] [place], [place] [address]    Report Status PENDING  Incomplete    Studies/Results: Left ventricular ejection fraction, by estimation, is 25 to 30%. The left ventricle has severely decreased function. The left ventricle demonstrates regional wall motion abnormalities , anterior , anterospetal and apical wall hypokinesis. 2. Right ventricular systolic function is normal. The right ventricular size is normal. There is mildly elevated pulmonary artery systolic pressure. 3. Left atrial size was mildly dilated. 4. No vegetation noted. IMPRESSION: Large presacral decubitus ulcer with progressive bone destruction of the distal sacrum and coccyx consistent with osteomyelitis. Chronic nonspecific sclerosis within S1 segment of sacrum. Prostatic enlargement with  mild bladder wall thickening which could represent muscular hypertrophy from chronic bladder outlet obstruction or from chronic cystitis. Nonobstructing 4 mm calculus distal LEFT ureter. Double-barrel colostomy LEFT lower quadrant.  Assessment/Plan: 69 year old gentleman is largely bedbound,Has hypertension, hyperlipidemia, diabetes, GERD, depression, proximal A. fib, obstructive sleep apnea, Crohn's disease status post colostomy, indwelling Foley catheter, chronic systolic CHF.  He was recently admitted for a prolonged hospital course with osteomyelitis and sacral wound.  He also had pseudomonal bacteremia.  Cultures from his wound were mixed.  He was discharged on Zosyn and linezolid.  He was readmitted after failure to thrive at home with acute renal failure.  He is also developed thrombocytopenia.  He had completed a 2-week course of linezolid for VRE.  His wound per wound care is clean with minimal evidence of necrosis. Yesterday we stopped his Zosyn.  Is been on linezolid for several days now however his platelets continue to drop.  Pseudomonas bacteremia- source is his long term decub ulcer which also grew Pseudomonas, VRE and proteus and anaerobes from bone biopsy. He has AICD and hence risk  for endocarditis , lead vegetation but TTE is negative. Per cards he is not a great TEE candidate.  Given the diarrhea and the thrombocytopenia I have changed his Zosyn to Cipro and Flagyl and will continue this for now..  Stage IV sacral decubitus with chronic osteomyelitis -at this point will continue the Cipro Flagyl as above.  We will also restart daptomycin while he is inpatient. Continue with aggressive wound care..   Thank you very much for the consult. Will follow with you.  Leonel Ramsay    10/15/2019, 9:08 PM

## 2019-10-15 NOTE — Progress Notes (Signed)
PROGRESS NOTE    Colton Lamb  WIO:[id number] DOB: 1951/05/27 DOA: 10/11/2019 PCP: [name], MD       Assessment & Plan:   Principal Problem:   Acute renal failure (ARF) ([place]) Active Problems:   Hyperlipidemia   CAD S/P percutaneous coronary angioplasty - multiple PCIs   Atrial fibrillation (HCC)   GERD   CROHN'S DISEASE-LARGE & SMALL INTESTINE   Hypertension, essential   Chronic systolic CHF (congestive heart failure) (HCC)   Normocytic anemia   Chronic osteomyelitis ([place])   Colostomy in place [facility])   Bacteremia due to Pseudomonas   Diarrhea   COVID-19 virus infection   Thrombocytopenia ([place])   Sacral decubitus ulcer, stage IV ([place])  Acute renal failure: Cr continues to trend down. Baseline around 0.82. Likely due to prerenal secondary to dehydration. Nephro following and recs apprec.  Avoid nephrotoxics meds, hypotension  Diarrhea: c diff PCR negative. GI path panel completely neg. Continue w/ imodium   Hyperlipidemia: continue on statin   CAD: s/p multiple PCIs. No CP. Continue w/ aspirin and lipitro  Atrial fibrillation: w/ RVR. PAF vs chronic. Hold xarelto secondary to AKI and started on IV heparin. IV heparin on hold secondary to bleeding stage IV sacral decubitus ulcer. Continue on digoxin.  S/p IV diltiazem x 1. Continue on tele. Cardio following and recs apprec   GERD: continue on PPI   Crohns Disease: continue balsalazide. S/p colostomy  Chronic systolic CHF: echo on 11/25/8339 showed EF 25-30%. BNP is elevated at 1655, but does not have obvious CHF exacerbation.  Due to AKI, will not start diuretics. Monitor volume status closely  Normocytic anemia & acute blood loss anemia: possibly secondary to IV heparin use. S/p 1 unit PRBCs 10/13/19. Continue to hold heparin drip. Will monitor H&H.   Chronic osteomyelitis & bacteremia: secondary to pseudomonas. Continue on zosyn until 10/21/19 as per ID on last admission but holding until ID sees the pt  today, ID consulted. Completed linezolid course 10/14/19. Repeat blood cx NGTD  COVID-19 virus infection: asymptomatic  Thrombocytopenia: Likely due to ongoing infection vs abx use. No need for a transfusion at this time. Linezolid was d/c as the course was complete but platelets continue to trend down. Will re-consult ID to see if zosyn is still needed in this setting. Will continue to monitor   Sacral decubitus ulcer, stage IV: present on admission. Bleeding from ulcer, so continue to hold heparin drip. Wound care consulted   Generalized weakness: OT recs SNF; PT signed off   Failure to thrive: lives at home alone. Cannot take care of himself at home and pt agrees with this. Discussed palliative care w/ pt and pt refused. Pt's sister who was at bedside said it has been discussed before with pt and pt's refuses each time  DVT prophylaxis: IV heparin (on hold) secondary to acute blood loss anemia & thrombocytopenia  Code Status: full  Family Communication:  Disposition Plan: needs to d/c to SNF vs LTAC, pt is unable to care for himself at home. Discussed palliative care w/ pt and pt refused. Pt's sister who was at bedside said it has been discussed before with pt and pt's refuses each time    Consultants:   nephro  cardio   Procedures:    Antimicrobials: zosyn   Subjective: Pt c/o fatigue  Objective: Vitals:   10/14/19 1727 10/14/19 1915 10/15/19 0351 10/15/19 0835  BP: 111/65 112/70 109/62 102/68  Pulse: 96 96 99 93  Resp:  17  Temp: 97.7 F (36.5 C) 98.3 F (36.8 C) 97.8 F (36.6 C) 97.9 F (36.6 C)  TempSrc: Oral Oral Oral   SpO2: 100% 100% 100% 98%  Weight:   80.3 kg   Height:        Intake/Output Summary (Last 24 hours) at 10/15/2019 0844 Last data filed at 10/15/2019 0540 Gross per 24 hour  Intake 1981.57 ml  Output 2400 ml  Net -418.43 ml   Filed Weights   10/13/19 0456 10/14/19 0412 10/15/19 0351  Weight: 87.2 kg 88.5 kg 80.3 kg     Examination:  General exam: Appears calm and comfortable  Respiratory system: Clear to auscultation. No rales, rhonchi  Cardiovascular system: irregularly irregular. No, rubs, gallops or clicks.  Gastrointestinal system: Abdomen is nondistended, soft and nontender. Normal bowel sounds heard. Central nervous system: Alert and oriented. Moves all 4 extremities Psychiatry: Judgement and insight appear normal. Flat mood and affect.     Data Reviewed: I have personally reviewed following labs and imaging studies  CBC: Recent Labs  Lab 10/11/19 0933 10/11/19 0933 10/12/19 0821 10/13/19 0702 10/13/19 1923 10/14/19 0552 10/15/19 0433  WBC 12.0*  --  10.3 9.9  --  9.7 10.6*  NEUTROABS 9.9*  --   --   --   --   --   --   HGB 8.5*   < > 8.0* 7.1* 8.2* 7.9* 7.3*  HCT 27.8*   < > 26.1* 22.9* 26.3* 25.7* 23.1*  MCV 86.9  --  87.3 86.4  --  87.4 87.8  PLT 109*  --  82* 61*  --  41* 36*   < > = values in this interval not displayed.   Basic Metabolic Panel: Recent Labs  Lab 10/12/19 0821 10/12/19 1216 10/13/19 1154 10/13/19 1923 10/14/19 0005 10/14/19 0552 10/15/19 0433  NA 142   < > 142 142 143 143 144  K 3.6   < > 3.8 3.7 3.6 3.9 3.5  CL 107   < > 104 104 103 103 101  CO2 18*   < > 24 26 25 26  33*  GLUCOSE 90   < > 118* 124* 122* 111* 113*  BUN 69*   < > 61* 59* 62* 59* 55*  CREATININE 4.42*   < > 3.91* 3.83* 3.95* 3.68* 3.36*  CALCIUM 7.7*   < > 7.8* 7.6* 7.5* 7.6* 7.4*  MG 1.8  --   --   --   --   --   --    < > = values in this interval not displayed.   GFR: Estimated Creatinine Clearance: 22.4 mL/min (A) (by C-G formula based on SCr of 3.36 mg/dL (H)). Liver Function Tests: Recent Labs  Lab 10/11/19 0933  AST 19  ALT 15  ALKPHOS 88  BILITOT 0.9  PROT 6.2*  ALBUMIN 2.1*   No results for input(s): LIPASE, AMYLASE in the last 168 hours. No results for input(s): AMMONIA in the last 168 hours. Coagulation Profile: Recent Labs  Lab 10/11/19 2107  INR 1.4*    Cardiac Enzymes: No results for input(s): CKTOTAL, CKMB, CKMBINDEX, TROPONINI in the last 168 hours. BNP (last 3 results) No results for input(s): PROBNP in the last 8760 hours. HbA1C: No results for input(s): HGBA1C in the last 72 hours. CBG: No results for input(s): GLUCAP in the last 168 hours. Lipid Profile: No results for input(s): CHOL, HDL, LDLCALC, TRIG, CHOLHDL, LDLDIRECT in the last 72 hours. Thyroid Function Tests: No results for input(s):  TSH, T4TOTAL, FREET4, T3FREE, THYROIDAB in the last 72 hours. Anemia Panel: No results for input(s): VITAMINB12, FOLATE, FERRITIN, TIBC, IRON, RETICCTPCT in the last 72 hours. Sepsis Labs: Recent Labs  Lab 10/11/19 2315 10/12/19 0208  LATICACIDVEN 1.5 1.6    Recent Results (from the past 240 hour(s))  GI pathogen panel by PCR, stool     Status: None   Collection Time: 10/11/19  9:33 AM   Specimen: STOOL  Result Value Ref Range Status   Plesiomonas shigelloides NOT DETECTED NOT DETECTED Final   Yersinia enterocolitica NOT DETECTED NOT DETECTED Final   Vibrio NOT DETECTED NOT DETECTED Final   Enteropathogenic E coli NOT DETECTED NOT DETECTED Final   E coli (ETEC) LT/ST NOT DETECTED NOT DETECTED Final   E coli 6979 by PCR Not applicable NOT DETECTED Final   Cryptosporidium by PCR NOT DETECTED NOT DETECTED Final   Entamoeba histolytica NOT DETECTED NOT DETECTED Final   Adenovirus F 40/41 NOT DETECTED NOT DETECTED Final   Norovirus GI/GII NOT DETECTED NOT DETECTED Final   Sapovirus NOT DETECTED NOT DETECTED Final    Comment: (NOTE) Performed At: [facility] [place], [place] [id number] Rush Farmer MD SM:[id number]    Vibrio cholerae NOT DETECTED NOT DETECTED Final   Campylobacter by PCR NOT DETECTED NOT DETECTED Final   Salmonella by PCR NOT DETECTED NOT DETECTED Final   E coli (STEC) NOT DETECTED NOT DETECTED Final   Enteroaggregative E coli NOT DETECTED NOT DETECTED Final   Shigella by PCR NOT  DETECTED NOT DETECTED Final   Cyclospora cayetanensis NOT DETECTED NOT DETECTED Final   Astrovirus NOT DETECTED NOT DETECTED Final   G lamblia by PCR NOT DETECTED NOT DETECTED Final   Rotavirus A by PCR NOT DETECTED NOT DETECTED Final  C Difficile Quick Screen w PCR reflex     Status: None   Collection Time: 10/11/19  9:33 AM   Specimen: STOOL  Result Value Ref Range Status   C Diff antigen NEGATIVE NEGATIVE Final   C Diff toxin NEGATIVE NEGATIVE Final   C Diff interpretation No C. difficile detected.  Final    Comment: Performed at [facility], [address] [place], [place] [address]  Culture, blood (Routine X 2) w Reflex to ID Panel     Status: None (Preliminary result)   Collection Time: 10/11/19  4:34 PM   Specimen: BLOOD RIGHT HAND  Result Value Ref Range Status   Specimen Description BLOOD RIGHT HAND  Final   Special Requests   Final    BOTTLES DRAWN AEROBIC ONLY Blood Culture results may not be optimal due to an inadequate volume of blood received in culture bottles   Culture   Final    NO GROWTH 4 DAYS Performed at [facility] [place], [address], [place], [place] [address]    Report Status PENDING  Incomplete         Radiology Studies: No results found.      Scheduled Meds: . ascorbic acid  500 mg Oral BID  . atorvastatin  80 mg Oral Daily  . balsalazide  750 mg Oral TID  . Chlorhexidine Gluconate Cloth  6 each Topical Daily  . collagenase  1 application Topical Daily  . digoxin  0.0625 mg Oral Q48H  . feeding supplement (NEPRO CARB STEADY)  237 mL Oral TID BM  . finasteride  5 mg Oral Daily  . influenza vaccine adjuvanted  0.5 mL Intramuscular Tomorrow-1000  . lactobacillus  1 g Oral TID WC  . loperamide  2 mg Oral Q8H  . midodrine  10 mg Oral TID  . multivitamin with minerals  1 tablet Oral Daily  . pantoprazole  40 mg Oral Daily  . tamsulosin  0.4 mg Oral QPC breakfast  . zinc sulfate  220 mg Oral Daily   Continuous  Infusions: . piperacillin-tazobactam (ZOSYN)  IV    .  sodium bicarbonate (isotonic) infusion in sterile water 50 mL/hr at 10/15/19 0104     LOS: 4 days    Time spent: 33 mins     Wyvonnia Dusky, MD [facility] Pager [phone]  If 7PM-7AM, please contact night-coverage [email or web address] 10/15/2019, 8:44 AM

## 2019-10-16 ENCOUNTER — Encounter: Payer: Self-pay | Admitting: Internal Medicine

## 2019-10-16 ENCOUNTER — Ambulatory Visit: Payer: Medicare Other | Admitting: Internal Medicine

## 2019-10-16 DIAGNOSIS — R7881 Bacteremia: Secondary | ICD-10-CM

## 2019-10-16 DIAGNOSIS — B965 Pseudomonas (aeruginosa) (mallei) (pseudomallei) as the cause of diseases classified elsewhere: Secondary | ICD-10-CM

## 2019-10-16 DIAGNOSIS — L89154 Pressure ulcer of sacral region, stage 4: Secondary | ICD-10-CM

## 2019-10-16 LAB — BASIC METABOLIC PANEL
Anion gap: 10 (ref 5–15)
BUN: 55 mg/dL — ABNORMAL HIGH (ref 8–23)
CO2: 34 mmol/L — ABNORMAL HIGH (ref 22–32)
Calcium: 7.4 mg/dL — ABNORMAL LOW (ref 8.9–10.3)
Chloride: 100 mmol/L (ref 98–111)
Creatinine, Ser: 2.92 mg/dL — ABNORMAL HIGH (ref 0.61–1.24)
GFR calc Af Amer: 24 mL/min — ABNORMAL LOW (ref >60)
GFR calc non Af Amer: 21 mL/min — ABNORMAL LOW (ref >60)
Glucose, Bld: 100 mg/dL — ABNORMAL HIGH (ref 70–99)
Potassium: 3 mmol/L — ABNORMAL LOW (ref 3.5–5.1)
Sodium: 144 mmol/L (ref 135–145)

## 2019-10-16 LAB — CBC
HCT: 23.5 % — ABNORMAL LOW (ref 39.0–52.0)
Hemoglobin: 7.4 g/dL — ABNORMAL LOW (ref 13.0–17.0)
MCH: 27.4 pg (ref 26.0–34.0)
MCHC: 31.5 g/dL (ref 30.0–36.0)
MCV: 87 fL (ref 80.0–100.0)
Platelets: 45 10*3/uL — ABNORMAL LOW (ref 150–400)
RBC: 2.7 MIL/uL — ABNORMAL LOW (ref 4.22–5.81)
RDW: 20 % — ABNORMAL HIGH (ref 11.5–15.5)
WBC: 10.7 10*3/uL — ABNORMAL HIGH (ref 4.0–10.5)
nRBC: 0 % (ref 0.0–0.2)

## 2019-10-16 LAB — CULTURE, BLOOD (ROUTINE X 2): Culture: NO GROWTH

## 2019-10-16 LAB — IMMATURE PLATELET FRACTION: Immature Platelet Fraction: 2.1 % (ref 1.2–8.6)

## 2019-10-16 LAB — MAGNESIUM: Magnesium: 1.3 mg/dL — ABNORMAL LOW (ref 1.7–2.4)

## 2019-10-16 MED ORDER — SODIUM CHLORIDE 0.9 % IV SOLN
INTRAVENOUS | Status: DC | PRN
  Administered 2019-10-16 – 2019-10-19 (×2): 250 mL via INTRAVENOUS
  Administered 2019-10-19 – 2019-10-20 (×2): 500 mL via INTRAVENOUS
  Administered 2019-10-20 – 2019-10-21 (×5): 250 mL via INTRAVENOUS

## 2019-10-16 MED ORDER — SODIUM CHLORIDE 0.9 % IV SOLN
800.0000 mg | INTRAVENOUS | Status: DC
  Administered 2019-10-16: 800 mg via INTRAVENOUS
  Filled 2019-10-16: qty 16

## 2019-10-16 MED ORDER — POTASSIUM CHLORIDE CRYS ER 20 MEQ PO TBCR
40.0000 meq | EXTENDED_RELEASE_TABLET | ORAL | Status: AC
  Administered 2019-10-16 (×2): 40 meq via ORAL
  Filled 2019-10-16 (×2): qty 2

## 2019-10-16 MED ORDER — MAGNESIUM SULFATE 2 GM/50ML IV SOLN
2.0000 g | Freq: Once | INTRAVENOUS | Status: AC
  Administered 2019-10-16: 2 g via INTRAVENOUS
  Filled 2019-10-16: qty 50

## 2019-10-16 MED ORDER — ATORVASTATIN CALCIUM 20 MG PO TABS
20.0000 mg | ORAL_TABLET | Freq: Every day | ORAL | Status: AC
  Administered 2019-10-17 – 2019-10-22 (×6): 20 mg via ORAL
  Filled 2019-10-16 (×6): qty 1

## 2019-10-16 NOTE — Progress Notes (Signed)
Nutrition Follow Up Notes  DOCUMENTATION CODES:   Not applicable  INTERVENTION:   Nepro Shake po TID, each supplement provides 425 kcal and 19 grams protein  Magic cup BID with lunch and dinner, each supplement provides 290 kcal and 9 grams of protein  MVI daily  Vitamin C 576m po BID  Liberalize diet   Pt at high refeed risk; recommend monitor K, Mg and P labs daily   NUTRITION DIAGNOSIS:   Increased nutrient needs related to wound healing as evidenced by increased estimated needs.  GOAL:   Patient will meet greater than or equal to 90% of their needs  -not met   MONITOR:   PO intake, Supplement acceptance, Skin, Labs  ASSESSMENT:   69yo male admitted with infected sacral decubitus ulcer, acute severe metabolic acidosis, increased colostomy output, AKI. PMH includes COVID-19 infection 09/23/19, HTN, HLD, DM, GERD, depression, PAF, OSA, Crohn's disease, colostomy, CHF, chronic osteomyelitis.   Unable to reach pt via phone. Pt with fair appetite and oral intake in hospital. Per MD note, pt reports that he is eating well but pt documented to be eating 0-75% of meals. Pt is drinking some Nepro supplements. RD will add Magic Cups to meal trays. RD will also liberalize the heart healthy portion of pt's diet as this is restrictive of protein. Pt is at high refeed risk; recommend monitor electrolytes. Per chart, pt currently at his [facility]. Plan is for SNF after discharge.   Medications reviewed and include: vitamin C, lactobacillus, imodium, MVI, protonix, zinc, ciprofloxacin, daptomycin, metronidazole   Labs reviewed: K 3.0(L), BUN 55(H), creat 2.92(H), Mg 1.3(L)  Diet Order:   Diet Order            Diet heart healthy/carb modified Room service appropriate? Yes; Fluid consistency: Thin  Diet effective now             EDUCATION NEEDS:   Not appropriate for education at this time  Skin:  Skin Assessment: Skin Integrity Issues: Skin Integrity Issues:: DTI, Stage II,  Stage IV DTI: buttocks Stage II: back, L thigh, L buttocks Stage IV: sacrum   Last BM:  3/17- type 6- via colostomy   Height:   Ht Readings from Last 1 Encounters:  10/11/19 5' 11"  (1.803 m)    Weight:   Wt Readings from Last 1 Encounters:  10/16/19 111.6 kg    Ideal Body Weight:  78.2 kg  BMI:  Body mass index is 34.31 kg/m.  Estimated Nutritional Needs:   Kcal:  2100-2300  Protein:  120-140 gm  Fluid:  2-2.2 L  CKoleen DistanceMS, RD, LDN Contact information available in Amion

## 2019-10-16 NOTE — Consult Note (Addendum)
Hematology/Oncology Consult note [facility] [phone] [phone]  Patient Care Team: [name], MD as PCP - General (Family Medicine) Rockey Situ Kathlene November, MD as PCP - Cardiology (Cardiology) Minna Merritts, MD as Consulting Physician (Cardiology) Jason Coop, NP as Nurse Practitioner Toni Arthurs, NP as Nurse Practitioner (Family Medicine)   Name of the patient: Colton Lamb  [id number]  08/10/1950   Date of visit: 10/16/19 REASON FOR COSULTATION:  Thrombocytopenia History of presenting illness-  69 y.o. male with PMH listed at below who is currently admitted due to failure to thrive, increased colostomy output,,acute kidney failure.  Patient was also recently admitted due to osteomyelitis and sacral wound, pseudomonal bacteremia. Patient was discharged on Zosyn and linezolid.  He has finished a 2 weeks course of linezolid for VRE.  Zosyn was continued during this admission and to yesterday, Zosyn was stopped due to the concern of drug-induced thrombocytopenia.  Hematology oncology was consulted for continued decrease of platelet counts. Patient is currently on Cipro and Flagyl.  Patient was on Xarelto for atrial fibrillation.  Xarelto was held due to renal failure and patient was started on IV heparin.  IV heparin was hold secondary to bleeding stage IV sacral decubitus ulcer.  Patient was seen at the bedside.  He denies any new complaints.  Denies any pain.  No reports of acute bleeding events.    Review of Systems  Constitutional: Positive for fatigue.  HENT:   Negative for sore throat.   Respiratory: Negative for shortness of breath.   Cardiovascular: Negative for chest pain.  Gastrointestinal: Negative for abdominal pain.  Genitourinary: Negative for difficulty urinating.   Skin:       Sacral decubitus ulcer  Neurological: Negative for headaches.  Psychiatric/Behavioral: Negative for confusion.     Allergies  Allergen Reactions  . Iodine Other (See Comments)    Shortness of breath, swelling and hives  . Shrimp [Shellfish Allergy] Other (See Comments)    SWELLING    HIVES    SHORTNESS OF BREATH  . Tetracycline Rash    Patient Active Problem List   Diagnosis Date Noted  . Diarrhea 10/11/2019  . COVID-19 virus infection 10/11/2019  . Thrombocytopenia ([place]) 10/11/2019  . Sacral decubitus ulcer, stage IV ([place]) 10/11/2019  . Encounter for competency evaluation   . Acute respiratory distress syndrome (ARDS) due to COVID-19 virus ([place]) 09/29/2019  . Bacteremia due to Pseudomonas 09/29/2019  . Goals of care, counseling/discussion   . Palliative care by specialist   . HCAP (healthcare-associated pneumonia) 09/23/2019  . Colostomy in place [facility]) 09/23/2019  . Bleeding 07/11/2019  . Chronic osteomyelitis ([place]) 06/30/2019  . PICC (peripherally inserted central catheter) flush   . Normocytic anemia 06/21/2019  . Decubitus ulcer of sacral region, unstageable ([place]) 06/19/2019  . Hypotension 06/19/2019  . Anemia   . Acute respiratory failure ([place]) 06/12/2019  . Chronic systolic CHF (congestive heart failure) ([place])   . Acute blood loss anemia   . Pressure injury of skin 03/17/2019  . Acute renal failure (ARF) ([place])   . Empyema lung ([place])   . Shortness of breath   . Pleural effusion on right   . Sepsis ([place]) 03/04/2019  . Restless legs syndrome (RLS) 06/16/2017  . Hypertension, essential 06/16/2017  . CAD in native artery 04/27/2017  . Hydrocephalus ([place]) 09/08/2016  . Dizziness and giddiness   . Elevated troponin 09/06/2016  . Type II diabetes mellitus ([place])   . Ischemic cardiomyopathy   .  Hyponatremia 07/01/2015  . Diabetes mellitus type 2, uncontrolled, with complications ([place]) 26/94/8546  . 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 OSA (obstructive sleep apnea) 12/20/2013  . Morbid obesity ([place]) 12/20/2013  . AKI (acute kidney injury) -  Creatinine improved at d/c 12/14/2013  . Elevated TSH - will need f/u TFTs with PCP in 3-4 weeks 12/14/2013  . Junctional bradycardia - resolved 12/14/2013  . Syncope - due to bradycardia in setting of Digoxin Toxicity 12/11/2013  . Acute on chronic HFrEF (heart failure with reduced ejection fraction) ([place]) 12/06/2013  . At risk for sudden cardiac death - on LifeVest Dec 18, 2013  . Acute on chronic combined systolic and diastolic CHF (congestive heart failure) ([place]) 12-18-13  . Cardiomyopathy, ischemic 12/18/2013  . Atrial flutter ([place]) 11/22/2013  . NSTEMI (non-ST elevated myocardial infarction) ([place]) 11/22/2013  . Long term current use of anticoagulant 10/19/2010  . Hyperlipidemia 05/06/2010  . Edema 05/06/2010  . CAD S/P percutaneous coronary angioplasty - multiple PCIs 11/11/2008  . Atrial fibrillation ([place]) 11/11/2008  . GERD 11/11/2008  . [place] INTESTINE 11/11/2008  . COLONIC POLYPS, HX OF 11/11/2008  . [place] ALLERGY 11/11/2008     Past Medical History:  Diagnosis Date  . Atrial flutter ([place])    a. s/p Cardioversion 11/22/13, on amiodarone and Xarelto.  . CHF (congestive heart failure) ([place])   . Chronic osteomyelitis ([place]) 06/30/2019   s/p colostomy  . Chronic systolic heart failure ([place])    a. 10/2013 EF 20-25%, grade III DD, RV mildly dilated and sys fx mild/mod reduced;  b. 01/2014 Echo: EF 30-35%, gr3 DD, mod dil LA. c. 07/2019 EF 20-25%  . Coronary artery disease    a. s/p MI 2007/2015;  b. s/p prior PCI to the LAD/LCX/PDA/PL;  c. 2008: s/p Cypher DES to the OM.  Marland Kitchen Crohn's ileocolitis ([place])   . GERD (gastroesophageal reflux disease)   . Hx of adenomatous colonic polyps 11/2003  . Hyperlipidemia   . Hypertension   . Hypotension   . Ischemic cardiomyopathy    s. 01/2014 s/p MDT [id number] [name] XT DR single lead AICD.  Marland Kitchen Obesity   . Paroxysmal atrial fibrillation (HCC)    a. CHA2DS2VASc = (CHF, HTN, agex1, DM)  . Sleep apnea   . Syncope     a.  11/2013 in setting of volume depletion and bradycardia due to dig toxicity   . Type II diabetes mellitus ([place])      Past Surgical History:  Procedure Laterality Date  . ATRIAL FLUTTER ABLATION N/A 04/16/2014   Procedure: ATRIAL FLUTTER ABLATION;  Surgeon: Evans Lance, MD;  Location: [facility] CATH LAB;  Service: Cardiovascular;  Laterality: N/A;  . CARDIAC CATHETERIZATION  10/2013  . CARDIAC DEFIBRILLATOR PLACEMENT  04/16/2014   Medtronic Evira device  . CARDIAC ELECTROPHYSIOLOGY STUDY AND ABLATION  04/16/2014   atrial flutter ablation  . CARDIOVERSION N/A 03/05/2014   Procedure: CARDIOVERSION;  Surgeon: Jolaine Artist, MD;  Location: [facility] ENDOSCOPY;  Service: Cardiovascular;  Laterality: N/A;  . CATARACT EXTRACTION W/PHACO Right 01/04/2017   Procedure: CATARACT EXTRACTION PHACO AND INTRAOCULAR LENS PLACEMENT ([place])  Right Diabetic Complicated;  Surgeon: Leandrew Koyanagi, MD;  Location: [place];  Service: Ophthalmology;  Laterality: Right;  Diabetic  . CATARACT EXTRACTION W/PHACO Left 02/08/2017   Procedure: CATARACT EXTRACTION PHACO AND INTRAOCULAR LENS PLACEMENT ([place]) left diabetic;  Surgeon: Leandrew Koyanagi, MD;  Location: [place];  Service: Ophthalmology;  Laterality: Left;  Diabetic - oral meds sleep  apnea  . CORONARY ANGIOPLASTY WITH STENT PLACEMENT  2007; 2008 X 2   "1+1 ~ 1"  . FOOT SURGERY Left    bone spur  . HYDROCELE EXCISION Bilateral   . 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 IMPLANTABLE CARDIOVERTER DEFIBRILLATOR IMPLANT N/A 04/16/2014   Procedure: IMPLANTABLE CARDIOVERTER DEFIBRILLATOR IMPLANT;  Surgeon: Evans Lance, MD;  Location: [facility] CATH LAB;  Service: Cardiovascular;  Laterality: N/A;  . LEFT HEART CATHETERIZATION WITH CORONARY ANGIOGRAM N/A 11/22/2013   Procedure: LEFT HEART CATHETERIZATION WITH CORONARY ANGIOGRAM;  Surgeon: Sinclair Grooms, MD;  Location: [facility] CATH LAB;  Service: Cardiovascular;  Laterality:  N/A;  . TRANSVERSE LOOP COLOSTOMY N/A 07/03/2019   Procedure: TRANSVERSE LOOP COLOSTOMY;  Surgeon: Ronny Bacon, MD;  Location: [facility] ORS;  Service: General;  Laterality: N/A;    Social History   Socioeconomic History  . Marital status: Widowed    Spouse name: Not on file  . Number of children: 1  . Years of education: Not on file  . Highest education level: Not on file  Occupational History  . Occupation: retired    Fish farm manager: RETIRED  Tobacco Use  . Smoking status: Never Smoker  . Smokeless tobacco: Never Used  Substance and Sexual Activity  . Alcohol use: No  . Drug use: No  . Sexual activity: Never  Other Topics Concern  . Not on file  Social History Narrative  . Not on file   Social Determinants of Health   Financial Resource Strain: Low Risk   . Difficulty of Paying Living Expenses: Not hard at all  Food Insecurity: No Food Insecurity  . Worried About Charity fundraiser in the Last Year: Never true  . Ran Out of Food in the Last Year: Never true  Transportation Needs: No Transportation Needs  . Lack of Transportation (Medical): No  . Lack of Transportation (Non-Medical): No  Physical Activity: Inactive  . Days of Exercise per Week: 0 days  . Minutes of Exercise per Session: 0 min  Stress:   . Feeling of Stress :   Social Connections: Moderately Isolated  . Frequency of Communication with Friends and Family: Never  . Frequency of Social Gatherings with Friends and Family: Never  . Attends Religious Services: Never  . Active Member of Clubs or Organizations: Yes  . Attends Archivist Meetings: 1 to 4 times per year  . Marital Status: Never married  Intimate Partner Violence: Unknown  . Fear of Current or Ex-Partner: No  . Emotionally Abused: Not on file  . Physically Abused: No  . Sexually Abused: No     Family History  Problem Relation Age of Onset  . Breast cancer Mother   . Heart disease Father   . Heart attack Father   . Colon cancer  Neg Hx      Current Facility-Administered Medications:  .  acetaminophen (TYLENOL) tablet 650 mg, 650 mg, Oral, Q6H PRN **OR** acetaminophen (TYLENOL) suppository 650 mg, 650 mg, Rectal, Q6H PRN, Ivor Costa, MD .  ascorbic acid (VITAMIN C) tablet 500 mg, 500 mg, Oral, BID, Ivor Costa, MD, 500 mg at 10/16/19 0927 .  [START ON 10/17/2019] atorvastatin (LIPITOR) tablet 20 mg, 20 mg, Oral, Daily, Leonel Ramsay, MD .  balsalazide (COLAZAL) capsule 750 mg, 750 mg, Oral, TID, Ivor Costa, MD .  Chlorhexidine Gluconate Cloth 2 % PADS 6 each, 6 each, Topical, Daily, Ivor Costa, MD, 6 each at 10/15/19 1100 .  ciprofloxacin (CIPRO) IVPB 400 mg, 400 mg, Intravenous, Q24H, Rito Ehrlich A, RPH, Stopped at 10/16/19 0010 .  collagenase (SANTYL) ointment 1 application, 1 application, Topical, Daily, Ivor Costa, MD, 1 application at 03/54/65 0930 .  DAPTOmycin (CUBICIN) 800 mg in sodium chloride 0.9 % IVPB, 800 mg, Intravenous, Q48H, Benita Gutter, RPH, Stopped at 10/16/19 1625 .  digoxin (LANOXIN) tablet 0.0625 mg, 0.0625 mg, Oral, Q48H, Sharion Settler, NP, 0.0625 mg at 10/16/19 0500 .  feeding supplement (NEPRO CARB STEADY) liquid 237 mL, 237 mL, Oral, TID BM, Wyvonnia Dusky, MD, Last Rate: 0 mL/hr at 10/16/19 0429, 237 mL at 10/16/19 1359 .  finasteride (PROSCAR) tablet 5 mg, 5 mg, Oral, Daily, Ivor Costa, MD, 5 mg at 10/16/19 0927 .  influenza vaccine adjuvanted (FLUAD) injection 0.5 mL, 0.5 mL, Intramuscular, Tomorrow-1000, Niu, Xilin, MD .  ipratropium-albuterol (DUONEB) 0.5-2.5 (3) MG/3ML nebulizer solution 3 mL, 3 mL, Nebulization, Q4H PRN, Ivor Costa, MD .  lactobacillus (FLORANEX/LACTINEX) granules 1 g, 1 g, Oral, TID WC, Ivor Costa, MD, 1 g at 10/16/19 1629 .  loperamide (IMODIUM) capsule 2 mg, 2 mg, Oral, Q8H, Ivor Costa, MD, 2 mg at 10/16/19 1358 .  metroNIDAZOLE (FLAGYL) IVPB 500 mg, 500 mg, Intravenous, Q8H, Leonel Ramsay, MD, Last Rate: 100 mL/hr at 10/16/19 1404, 500 mg at  10/16/19 1404 .  midodrine (PROAMATINE) tablet 10 mg, 10 mg, Oral, TID, Agbor-Etang, Aaron Edelman, MD, 10 mg at 10/16/19 1543 .  multivitamin with minerals tablet 1 tablet, 1 tablet, Oral, Daily, Benita Gutter, RPH, 1 tablet at 10/16/19 6812 .  ondansetron (ZOFRAN) injection 4 mg, 4 mg, Intravenous, Q8H PRN, Ivor Costa, MD, 4 mg at 10/13/19 1159 .  pantoprazole (PROTONIX) EC tablet 40 mg, 40 mg, Oral, Daily, Ivor Costa, MD, 40 mg at 10/16/19 7517 .  zinc sulfate capsule 220 mg, 220 mg, Oral, Daily, Benita Gutter, RPH, 220 mg at 10/16/19 0017   Physical exam:  Vitals:   10/16/19 1500 10/16/19 1600 10/16/19 1700 10/16/19 1940  BP: (!) 89/61 107/71 103/67 106/66  Pulse: 88 96 91 (!) 101  Resp:    20  Temp:    97.9 F (36.6 C)  TempSrc:    Oral  SpO2:    99%  Weight:      Height:       Physical Exam  Constitutional: No distress.  HENT:  Head: Atraumatic.  Eyes: Pupils are equal, round, and reactive to light. No scleral icterus.  Cardiovascular: Normal rate.  Pulmonary/Chest: Effort normal.  Abdominal: Soft.  +Colostomy  Musculoskeletal:        General: No edema.     Cervical back: Normal range of motion and neck supple.  Neurological: He is alert.  Skin: There is pallor.  Psychiatric: Affect normal.        CMP Latest Ref Rng & Units 10/16/2019  Glucose 70 - 99 mg/dL 100(H)  BUN 8 - 23 mg/dL 55(H)  Creatinine 0.61 - 1.24 mg/dL 2.92(H)  Sodium 135 - 145 mmol/L 144  Potassium 3.5 - 5.1 mmol/L 3.0(L)  Chloride 98 - 111 mmol/L 100  CO2 22 - 32 mmol/L 34(H)  Calcium 8.9 - 10.3 mg/dL 7.4(L)  Total Protein 6.5 - 8.1 g/dL -  Total Bilirubin 0.3 - 1.2 mg/dL -  Alkaline Phos 38 - 126 U/L -  AST 15 - 41 U/L -  ALT 0 - 44 U/L -   CBC Latest Ref Rng & Units 10/16/2019  WBC 4.0 - 10.5  K/uL 10.7(H)  Hemoglobin 13.0 - 17.0 g/dL 7.4(L)  Hematocrit 39.0 - 52.0 % 23.5(L)  Platelets 150 - 400 K/uL 45(L)   RADIOGRAPHIC STUDIES: I have personally reviewed the radiological images as  listed and agreed with the findings in the report.   CT PELVIS WO CONTRAST  Result Date: 09/24/2019 CLINICAL DATA:  Infected presacral wound EXAM: CT PELVIS WITHOUT CONTRAST TECHNIQUE: Multidetector CT imaging of the pelvis was performed following the standard protocol without intravenous contrast. Sagittal and coronal MPR images reconstructed from axial data set. COMPARISON:  06/22/2019 FINDINGS: Urinary Tract: Thick walled bladder which could represent chronic cystitis or chronic bladder outlet obstruction. No ureteral dilatation. However, 4 mm calculus is identified within the distal LEFT ureter image 30. Bowel: Prior appendectomy. Double-barrel colostomy LEFT lower quadrant. Question mild rectal wall thickening. Remaining visualized bowel loops unremarkable. Vascular/Lymphatic: Atherosclerotic calcifications aorta and iliac arteries. No adenopathy. Reproductive: Mild prostatic enlargement. Unremarkable seminal vesicles. Other:  No free intraperitoneal air or fluid. Musculoskeletal: Large presacral decubitus ulcer with exposed distal sacrum in wound. Bone destruction of distal sacrum and coccyx consistent with osteomyelitis progressive since prior study. Mild chronic sclerosis within S1 segment of sacrum unchanged. IMPRESSION: Large presacral decubitus ulcer with progressive bone destruction of the distal sacrum and coccyx consistent with osteomyelitis. Chronic nonspecific sclerosis within S1 segment of sacrum. Prostatic enlargement with mild bladder wall thickening which could represent muscular hypertrophy from chronic bladder outlet obstruction or from chronic cystitis. Nonobstructing 4 mm calculus distal LEFT ureter. Double-barrel colostomy LEFT lower quadrant. Electronically Signed   By: Lavonia Dana M.D.   On: 09/24/2019 09:38   US RENAL  Result Date: 10/11/2019 CLINICAL DATA:  Acute kidney injury EXAM: RENAL ULTRASOUND COMPARISON:  November 03, 2018 FINDINGS: Right Kidney: Renal measurements: 12.8 x  6.4 x 5.4 cm = volume: 234.4 mL . Echogenicity and renal cortical thickness are within normal limits. No mass, perinephric fluid, or hydronephrosis visualized. No sonographically demonstrable calculus or ureterectasis. Left Kidney: Renal measurements: 13.4 x 5.2 x 6.5 cm = volume: 238.4 mL. Echogenicity and renal cortical thickness are within normal limits. No mass, perinephric fluid, or hydronephrosis visualized. No sonographically demonstrable calculus or ureterectasis. Bladder: Decompressed with Foley catheter and cannot be assessed. Other: None. IMPRESSION: Normal appearing kidneys bilaterally.  Urinary bladder decompressed. Electronically Signed   By: Lowella Grip III M.D.   On: 10/11/2019 15:23   DG Chest Port 1 View  Result Date: 10/01/2019 CLINICAL DATA:  PICC line placement EXAM: PORTABLE CHEST 1 VIEW COMPARISON:  09/23/2019 FINDINGS: Right PICC line is been placed with the tip in the SVC. Left AICD is unchanged. Cardiomegaly. No confluent opacities or effusions. No acute bony abnormality. IMPRESSION: Right PICC line tip in the SVC. Cardiomegaly. No acute cardiopulmonary disease. Electronically Signed   By: Rolm Baptise M.D.   On: 10/01/2019 12:13   DG Chest Portable 1 View  Result Date: 09/23/2019 CLINICAL DATA:  69 year old male with leukocytosis EXAM: PORTABLE CHEST 1 VIEW COMPARISON:  Chest radiograph dated 07/15/2019. FINDINGS: Faint right lung base density may represent atelectasis. Trace right pleural effusion is not excluded. Clinical correlation is recommended. No pneumothorax. Borderline cardiomegaly. There is improved cardiac size since the prior radiograph. Left pectoral AICD device. No acute osseous pathology. IMPRESSION: 1. Minimal density at the right lung base may represent atelectasis or trace pleural effusion. Clinical correlation is recommended. 2. Improved cardiac size since the prior radiograph. Electronically Signed   By: Anner Crete M.D.   On: 09/23/2019 19:25  ECHOCARDIOGRAM COMPLETE  Result Date: 09/28/2019    ECHOCARDIOGRAM REPORT   Patient Name:   SERAFIN DECATUR Date of Exam: 09/28/2019 Medical Rec #:  [id number]        Height:       71.0 in Accession #:    [id number]       Weight:       240.0 lb Date of Birth:  Aug 25, 1950        BSA:          2.278 m Patient Age:    30 years         BP:           107/50 mmHg Patient Gender: M                HR:           83 bpm. Exam Location:  [facility] Procedure: 2D Echo Indications:     BACTEREMIA 790.7/R78.81  History:         Patient has prior history of Echocardiogram examinations, most                  recent 06/25/2019. CAD, Signs/Symptoms:Syncope; Risk                  Factors:Dyslipidemia, Diabetes and Hypertension.  Sonographer:     Avanell Shackleton Referring Phys:  [id number] Tsosie Billing Diagnosing Phys: Ida Rogue MD  Sonographer Comments: Technically difficult study due to poor echo windows, suboptimal parasternal window, suboptimal apical window and Technically challenging study due to limited acoustic windows. THERE IS ALOT OF ARTIFACT PRESENT DUE TO THE INTERFERENCE FROM THE EQUIPMENT IN THE COVID UNIT IMPRESSIONS  1. Left ventricular ejection fraction, by estimation, is 25 to 30%. The left ventricle has severely decreased function. The left ventricle demonstrates regional wall motion abnormalities , anterior , anterospetal and apical wall hypokinesis.  2. Right ventricular systolic function is normal. The right ventricular size is normal. There is mildly elevated pulmonary artery systolic pressure.  3. Left atrial size was mildly dilated.  4. No vegetation noted. FINDINGS  Left Ventricle: Left ventricular ejection fraction, by estimation, is 25 to 30%. The left ventricle has severely decreased function. The left ventricle demonstrates regional wall motion abnormalities. The left ventricular internal cavity size was normal  in size. There is no left ventricular hypertrophy. Left ventricular diastolic  parameters are indeterminate. Right Ventricle: The right ventricular size is normal. No increase in right ventricular wall thickness. Right ventricular systolic function is normal. There is mildly elevated pulmonary artery systolic pressure. The tricuspid regurgitant velocity is 2.66  m/s, and with an assumed right atrial pressure of 10 mmHg, the estimated right ventricular systolic pressure is 17.4 mmHg. Left Atrium: Left atrial size was mildly dilated. Right Atrium: Right atrial size was normal in size. Pericardium: Trivial pericardial effusion is present. Mitral Valve: The mitral valve is normal in structure and function. Normal mobility of the mitral valve leaflets. No evidence of mitral valve regurgitation. No evidence of mitral valve stenosis. Tricuspid Valve: The tricuspid valve is normal in structure. Tricuspid valve regurgitation is not demonstrated. No evidence of tricuspid stenosis. Aortic Valve: The aortic valve is normal in structure and function. Aortic valve regurgitation is not visualized. No aortic stenosis is present. Pulmonic Valve: The pulmonic valve was normal in structure. Pulmonic valve regurgitation is not visualized. No evidence of pulmonic stenosis. Aorta: The aortic root is normal in size and structure. Venous: The inferior vena cava is normal in size  with greater than 50% respiratory variability, suggesting right atrial pressure of 3 mmHg. IAS/Shunts: No atrial level shunt detected by color flow Doppler.  LEFT VENTRICLE PLAX 2D LVIDd:         5.07 cm LVIDs:         4.27 cm LV PW:         0.81 cm LV IVS:        1.04 cm  LEFT ATRIUM         Index LA diam:    4.00 cm 1.76 cm/m   AORTA Ao Root diam: 4.70 cm TRICUSPID VALVE TR Peak grad:   28.3 mmHg TR Vmax:        266.00 cm/s Ida Rogue MD Electronically signed by Ida Rogue MD Signature Date/Time: 09/28/2019/2:38:15 PM    Final    DG HIP UNILAT WITH PELVIS 2-3 VIEWS LEFT  Result Date: 10/01/2019 CLINICAL DATA:  Left hip pain  without known injury. EXAM: DG HIP (WITH OR WITHOUT PELVIS) 2-3V LEFT COMPARISON:  None. FINDINGS: There is no evidence of hip fracture or dislocation. There is no evidence of arthropathy or other focal bone abnormality. IMPRESSION: Negative. Electronically Signed   By: Marijo Conception M.D.   On: 10/01/2019 16:27   [place] EKG SITE RITE  Result Date: 09/30/2019 If Site Rite image not attached, placement could not be confirmed due to current cardiac rhythm.   Assessment and plan-  Thrombocytopenia, multifactorial, - drug-induced secondary to recent use of Zosyn and linezolid.  Linezolid course finished and Zosyn was discontinued yesterday.  Immature platelet fraction was inappropriately normal, indicating bone marrow suppression, due to hypotension and medication. 4T score is 2 [two-point from platelet count fall more than 50% and platelet nadir>= 20, no recent heparin prior to current exposure, definite other causes of thrombocytopenia, no thrombosis]  low probability for HIT. Continue monitor counts.  Anticipate platelet counts to recover in the next few days.  #Anemia, multifactorial.  Secondary to renal failure, chronic inflammation from decubitus ulcer.  Status post PRBC transfusion on 10/14/2019.  Check iron, TIBC, ferritin, B12 and folate.  Thank you for allowing me to participate in the care of this patient.      Earlie Server, MD, PhD Hematology Oncology [facility] Pager- [phone] 10/16/2019

## 2019-10-16 NOTE — Progress Notes (Signed)
Progress Note    Colton Lamb  QQP:[id number] DOB: [birth year]  DOA: 10/11/2019 PCP: [name], MD      Brief Narrative:    Medical records reviewed and are as summarized below:  Colton Lamb is an 69 y.o. male       Assessment/Plan:   Principal Problem:   Acute renal failure (ARF) ([place]) Active Problems:   Hyperlipidemia   CAD S/P percutaneous coronary angioplasty - multiple PCIs   Atrial fibrillation ([place])   GERD   CROHN'S DISEASE-LARGE & SMALL INTESTINE   Hypertension, essential   Chronic systolic CHF (congestive heart failure) (H[facility])   Normocytic anemia   Chronic osteomyelitis ([place])   Colostomy in place [facility])   Bacteremia due to Pseudomonas   Diarrhea   COVID-19 virus infection   Thrombocytopenia ([place])   Sacral decubitus ulcer, stage IV ([place])   Acute renal failure:Cr continues to trend down. Baseline around 0.82. Likely due to prerenal secondary to dehydration.  Avoid nephrotoxics meds, hypotension.  Follow-up with nephrologist.  Diarrhea: c diff PCR negative. GI path panel completely neg. Continue w/ imodium as needed  Hypokalemia and hypomagnesemia: Replete potassium patient respectively.  Monitor levels.  Hyperlipidemia: continue on statin   CAD: s/p multiple PCIs. Continue w/ aspirin and lipitro  Atrial fibrillation w/ RVR. PAF vs chronic.  Anticoagulation on hold because of worsening thrombocytopenia.    Hypotension: Asymptomatic.  Systolic BP has mostly been in the 90s.  This makes use of rate controlling drugs difficult.  Monitor BP closely.  GERD: continue on PPI   Crohns Disease: continue balsalazide. S/p colostomy  Chronic systolic ZTI:[id number].  Echo on 09/28/2019 showed EF 25-30%.  Normocytic anemia & acute blood loss anemia: possibly secondary to IV heparin use. S/p 1 unit PRBCs 10/13/19.   H&H is stable  Chronic osteomyelitis & Pseudomonas bacteremia: secondary to pseudomonas.  Zosyn changed to daptomycin and  Flagyl.  Follow-up with ID.  Completed linezolid course 10/14/19. Repeat blood cx NGTD  COVID-19 virus infection: asymptomatic  Worsening thrombocytopenia:Likely due to ongoing infection vs abx use. No need for a transfusion at this time. Linezolid was d/c as the course was complete but platelets continue to trend down.  Ordered HIT tests.  Consulted hematologist for further evaluation.    Sacral decubitus ulcer, stage IV: present on admission. continue local wound care.    Generalized weakness: PT and OT recommend SNF at discharge  Failure to thrive: lives at home alone. Cannot take care of himself at home and pt agrees with this.  Patient was not interested in palliative care.   Body mass index is 34.31 kg/m.  (Obesity)   Family Communication/Anticipated D/C date and plan/Code Status   DVT prophylaxis: SCDs Code Status: Full code Family Communication: Discussed with patient Disposition Plan: Plan to discharge to SNF when cleared by infectious disease specialist, cardiologist and hematologist      Subjective:   No complaints.  No dizziness, palpitations, shortness of breath or chest pain.  No bleeding from any site of his body.  Objective:    Vitals:   10/16/19 1001 10/16/19 1101 10/16/19 1200 10/16/19 1300  BP: (!) 87/67 95/62 (!) 87/57   Pulse: (!) 151 (!) 105 92   Resp:      Temp:      TempSrc:      SpO2: 99% 98%    Weight:    111.6 kg  Height:        Intake/Output Summary (Last 24 hours)  at 10/16/2019 1450 Last data filed at 10/16/2019 0507 Gross per 24 hour  Intake 876.05 ml  Output 1775 ml  Net -898.95 ml   Filed Weights   10/14/19 0412 10/15/19 0351 10/16/19 1300  Weight: 88.5 kg 80.3 kg 111.6 kg    Exam:  GEN: NAD SKIN: Stage IV sacral decubitus ulcer EYES: EOMI ENT: MMM CV: RRR PULM: CTA B ABD: soft, ND, NT, +BS CNS: AAO x 3, non focal EXT: No edema or tenderness GU: Foley catheter draining amber urine   Data Reviewed:   I have  personally reviewed following labs and imaging studies:  Labs: Labs show the following:   Basic Metabolic Panel: Recent Labs  Lab 10/12/19 0821 10/12/19 1216 10/13/19 1923 10/13/19 1923 10/14/19 0005 10/14/19 0005 10/14/19 0552 10/14/19 0552 10/15/19 0433 10/16/19 0618  NA 142   < > 142  --  143  --  143  --  144 144  K 3.6   < > 3.7   < > 3.6   < > 3.9   < > 3.5 3.0*  CL 107   < > 104  --  103  --  103  --  101 100  CO2 18*   < > 26  --  25  --  26  --  33* 34*  GLUCOSE 90   < > 124*  --  122*  --  111*  --  113* 100*  BUN 69*   < > 59*  --  62*  --  59*  --  55* 55*  CREATININE 4.42*   < > 3.83*  --  3.95*  --  3.68*  --  3.36* 2.92*  CALCIUM 7.7*   < > 7.6*  --  7.5*  --  7.6*  --  7.4* 7.4*  MG 1.8  --   --   --   --   --   --   --   --  1.3*   < > = values in this interval not displayed.   GFR Estimated Creatinine Clearance: 30.8 mL/min (A) (by C-G formula based on SCr of 2.92 mg/dL (H)). Liver Function Tests: Recent Labs  Lab 10/11/19 0933  AST 19  ALT 15  ALKPHOS 88  BILITOT 0.9  PROT 6.2*  ALBUMIN 2.1*   No results for input(s): LIPASE, AMYLASE in the last 168 hours. No results for input(s): AMMONIA in the last 168 hours. Coagulation profile Recent Labs  Lab 10/11/19 2107  INR 1.4*    CBC: Recent Labs  Lab 10/11/19 0933 10/11/19 0933 10/12/19 0821 10/12/19 0821 10/13/19 0702 10/13/19 1923 10/14/19 0552 10/15/19 0433 10/16/19 0618  WBC 12.0*   < > 10.3  --  9.9  --  9.7 10.6* 10.7*  NEUTROABS 9.9*  --   --   --   --   --   --   --   --   HGB 8.5*   < > 8.0*   < > 7.1* 8.2* 7.9* 7.3* 7.4*  HCT 27.8*   < > 26.1*   < > 22.9* 26.3* 25.7* 23.1* 23.5*  MCV 86.9   < > 87.3  --  86.4  --  87.4 87.8 87.0  PLT 109*   < > 82*  --  61*  --  41* 36* 45*   < > = values in this interval not displayed.   Cardiac Enzymes: No results for input(s): CKTOTAL, CKMB, CKMBINDEX, TROPONINI in the last 168 hours. BNP (  last 3 results) No results for input(s):  PROBNP in the last 8760 hours. CBG: No results for input(s): GLUCAP in the last 168 hours. D-Dimer: No results for input(s): DDIMER in the last 72 hours. Hgb A1c: No results for input(s): HGBA1C in the last 72 hours. Lipid Profile: No results for input(s): CHOL, HDL, LDLCALC, TRIG, CHOLHDL, LDLDIRECT in the last 72 hours. Thyroid function studies: No results for input(s): TSH, T4TOTAL, T3FREE, THYROIDAB in the last 72 hours.  Invalid input(s): FREET3 Anemia work up: No results for input(s): VITAMINB12, FOLATE, FERRITIN, TIBC, IRON, RETICCTPCT in the last 72 hours. Sepsis Labs: Recent Labs  Lab 10/11/19 2315 10/12/19 0208 10/12/19 0821 10/13/19 0702 10/14/19 0552 10/15/19 0433 10/16/19 0618  WBC  --   --    < > 9.9 9.7 10.6* 10.7*  LATICACIDVEN 1.5 1.6  --   --   --   --   --    < > = values in this interval not displayed.    Microbiology Recent Results (from the past 240 hour(s))  GI pathogen panel by PCR, stool     Status: None   Collection Time: 10/11/19  9:33 AM   Specimen: STOOL  Result Value Ref Range Status   Plesiomonas shigelloides NOT DETECTED NOT DETECTED Final   Yersinia enterocolitica NOT DETECTED NOT DETECTED Final   Vibrio NOT DETECTED NOT DETECTED Final   Enteropathogenic E coli NOT DETECTED NOT DETECTED Final   E coli (ETEC) LT/ST NOT DETECTED NOT DETECTED Final   E coli 2440 by PCR Not applicable NOT DETECTED Final   Cryptosporidium by PCR NOT DETECTED NOT DETECTED Final   Entamoeba histolytica NOT DETECTED NOT DETECTED Final   Adenovirus F 40/41 NOT DETECTED NOT DETECTED Final   Norovirus GI/GII NOT DETECTED NOT DETECTED Final   Sapovirus NOT DETECTED NOT DETECTED Final    Comment: (NOTE) Performed At: [facility] [place], [place] [id number] Rush Farmer MD YQ:[id number]    Vibrio cholerae NOT DETECTED NOT DETECTED Final   Campylobacter by PCR NOT DETECTED NOT DETECTED Final   Salmonella by PCR NOT DETECTED NOT DETECTED  Final   E coli (STEC) NOT DETECTED NOT DETECTED Final   Enteroaggregative E coli NOT DETECTED NOT DETECTED Final   Shigella by PCR NOT DETECTED NOT DETECTED Final   Cyclospora cayetanensis NOT DETECTED NOT DETECTED Final   Astrovirus NOT DETECTED NOT DETECTED Final   G lamblia by PCR NOT DETECTED NOT DETECTED Final   Rotavirus A by PCR NOT DETECTED NOT DETECTED Final  C Difficile Quick Screen w PCR reflex     Status: None   Collection Time: 10/11/19  9:33 AM   Specimen: STOOL  Result Value Ref Range Status   C Diff antigen NEGATIVE NEGATIVE Final   C Diff toxin NEGATIVE NEGATIVE Final   C Diff interpretation No C. difficile detected.  Final    Comment: Performed at [facility], [address] [place], [place] [address]  Culture, blood (Routine X 2) w Reflex to ID Panel     Status: None   Collection Time: 10/11/19  4:34 PM   Specimen: BLOOD RIGHT HAND  Result Value Ref Range Status   Specimen Description BLOOD RIGHT HAND  Final   Special Requests   Final    BOTTLES DRAWN AEROBIC ONLY Blood Culture results may not be optimal due to an inadequate volume of blood received in culture bottles   Culture   Final    NO GROWTH  5 DAYS Performed at [facility], [address] [place], [place] [address]    Report Status 10/16/2019 FINAL  Final    Procedures and diagnostic studies:  No results found.  Medications:   . ascorbic acid  500 mg Oral BID  . atorvastatin  80 mg Oral Daily  . balsalazide  750 mg Oral TID  . Chlorhexidine Gluconate Cloth  6 each Topical Daily  . collagenase  1 application Topical Daily  . digoxin  0.0625 mg Oral Q48H  . feeding supplement (NEPRO CARB STEADY)  237 mL Oral TID BM  . finasteride  5 mg Oral Daily  . influenza vaccine adjuvanted  0.5 mL Intramuscular Tomorrow-1000  . lactobacillus  1 g Oral TID WC  . loperamide  2 mg Oral Q8H  . midodrine  10 mg Oral TID  . multivitamin with minerals  1 tablet Oral Daily  . pantoprazole   40 mg Oral Daily  . zinc sulfate  220 mg Oral Daily   Continuous Infusions: . ciprofloxacin Stopped (10/16/19 0010)  . DAPTOmycin (CUBICIN)  IV    . metronidazole 500 mg (10/16/19 1404)     LOS: 5 days   Amarah Brossman  [facility]     10/16/2019, 2:50 PM

## 2019-10-16 NOTE — Progress Notes (Addendum)
Just got a call from tele that patient had a run of [place]. Went in to assess patient and was resting in bed and asymptomatic.  [place], the covering provider notified. Will continue to monitor.

## 2019-10-16 NOTE — Progress Notes (Signed)
[facility]  ROUNDING NOTE   Subjective:   No complaints. Claims to be eating well. Denies any significant diarrhea.  UOP 2474m.   Creatinine 2.92 (3.36) (3.68) (3.95)  Off IV fluids  Objective:  Vital signs in last 24 hours:  Temp:  [97.4 F (36.3 C)-99.7 F (37.6 C)] 99.7 F (37.6 C) (03/17 0737) Pulse Rate:  [92-151] 92 (03/17 1200) Resp:  [17-20] 19 (03/17 0737) BP: (85-134)/(57-74) 87/57 (03/17 1200) SpO2:  [98 %-100 %] 98 % (03/17 1101)  Weight change:  Filed Weights   10/13/19 0456 10/14/19 0412 10/15/19 0351  Weight: 87.2 kg 88.5 kg 80.3 kg    Intake/Output: I/O last 3 completed shifts: In: 2614.5 [P.O.:1074; I.V.:904.5; NG/GT:237; IV Piggyback:399.1] Out: 4525 [Urine:3825; Stool:700]   Intake/Output this shift:  No intake/output data recorded.  Physical Exam: General: NAD, laying in bed  Head: Normocephalic, atraumatic. Moist oral mucosal membranes  Eyes: Anicteric, PERRL  Neck: Supple, trachea midline  Lungs:  Clear to auscultation  Heart: Regular rate and rhythm  Abdomen:  Soft, nontender,   Extremities:  no peripheral edema.  Neurologic: Nonfocal, moving all four extremities   Skin Sacral decub, covered and clean       Basic Metabolic Panel: Recent Labs  Lab 10/12/19 0821 10/12/19 1216 10/13/19 1923 10/13/19 1923 10/14/19 0005 10/14/19 0005 10/14/19 0552 10/15/19 0433 10/16/19 0618  NA 142   < > 142  --  143  --  143 144 144  K 3.6   < > 3.7  --  3.6  --  3.9 3.5 3.0*  CL 107   < > 104  --  103  --  103 101 100  CO2 18*   < > 26  --  25  --  26 33* 34*  GLUCOSE 90   < > 124*  --  122*  --  111* 113* 100*  BUN 69*   < > 59*  --  62*  --  59* 55* 55*  CREATININE 4.42*   < > 3.83*  --  3.95*  --  3.68* 3.36* 2.92*  CALCIUM 7.7*   < > 7.6*   < > 7.5*   < > 7.6* 7.4* 7.4*  MG 1.8  --   --   --   --   --   --   --  1.3*   < > = values in this interval not displayed.    Liver Function Tests: Recent Labs  Lab  10/11/19 0933  AST 19  ALT 15  ALKPHOS 88  BILITOT 0.9  PROT 6.2*  ALBUMIN 2.1*   No results for input(s): LIPASE, AMYLASE in the last 168 hours. No results for input(s): AMMONIA in the last 168 hours.  CBC: Recent Labs  Lab 10/11/19 0933 10/11/19 0933 10/12/19 0821 10/12/19 0821 10/13/19 0702 10/13/19 1923 10/14/19 0552 10/15/19 0433 10/16/19 0618  WBC 12.0*   < > 10.3  --  9.9  --  9.7 10.6* 10.7*  NEUTROABS 9.9*  --   --   --   --   --   --   --   --   HGB 8.5*   < > 8.0*   < > 7.1* 8.2* 7.9* 7.3* 7.4*  HCT 27.8*   < > 26.1*   < > 22.9* 26.3* 25.7* 23.1* 23.5*  MCV 86.9   < > 87.3  --  86.4  --  87.4 87.8 87.0  PLT 109*   < > 82*  --  61*  --  41* 36* 45*   < > = values in this interval not displayed.    Cardiac Enzymes: No results for input(s): CKTOTAL, CKMB, CKMBINDEX, TROPONINI in the last 168 hours.  BNP: Invalid input(s): POCBNP  CBG: No results for input(s): GLUCAP in the last 168 hours.  Microbiology: Results for orders placed or performed during the hospital encounter of 10/11/19  GI pathogen panel by PCR, stool     Status: None   Collection Time: 10/11/19  9:33 AM   Specimen: STOOL  Result Value Ref Range Status   Plesiomonas shigelloides NOT DETECTED NOT DETECTED Final   Yersinia enterocolitica NOT DETECTED NOT DETECTED Final   Vibrio NOT DETECTED NOT DETECTED Final   Enteropathogenic E coli NOT DETECTED NOT DETECTED Final   E coli (ETEC) LT/ST NOT DETECTED NOT DETECTED Final   E coli 5188 by PCR Not applicable NOT DETECTED Final   Cryptosporidium by PCR NOT DETECTED NOT DETECTED Final   Entamoeba histolytica NOT DETECTED NOT DETECTED Final   Adenovirus F 40/41 NOT DETECTED NOT DETECTED Final   Norovirus GI/GII NOT DETECTED NOT DETECTED Final   Sapovirus NOT DETECTED NOT DETECTED Final    Comment: (NOTE) Performed At: [facility] [place], [place] [id number] Rush Farmer MD SW:[id number]    Vibrio cholerae NOT  DETECTED NOT DETECTED Final   Campylobacter by PCR NOT DETECTED NOT DETECTED Final   Salmonella by PCR NOT DETECTED NOT DETECTED Final   E coli (STEC) NOT DETECTED NOT DETECTED Final   Enteroaggregative E coli NOT DETECTED NOT DETECTED Final   Shigella by PCR NOT DETECTED NOT DETECTED Final   Cyclospora cayetanensis NOT DETECTED NOT DETECTED Final   Astrovirus NOT DETECTED NOT DETECTED Final   G lamblia by PCR NOT DETECTED NOT DETECTED Final   Rotavirus A by PCR NOT DETECTED NOT DETECTED Final  C Difficile Quick Screen w PCR reflex     Status: None   Collection Time: 10/11/19  9:33 AM   Specimen: STOOL  Result Value Ref Range Status   C Diff antigen NEGATIVE NEGATIVE Final   C Diff toxin NEGATIVE NEGATIVE Final   C Diff interpretation No C. difficile detected.  Final    Comment: Performed at [facility], [address] [place], [place] [address]  Culture, blood (Routine X 2) w Reflex to ID Panel     Status: None   Collection Time: 10/11/19  4:34 PM   Specimen: BLOOD RIGHT HAND  Result Value Ref Range Status   Specimen Description BLOOD RIGHT HAND  Final   Special Requests   Final    BOTTLES DRAWN AEROBIC ONLY Blood Culture results may not be optimal due to an inadequate volume of blood received in culture bottles   Culture   Final    NO GROWTH 5 DAYS Performed at [facility], [address], [place], [place] [address]    Report Status 10/16/2019 FINAL  Final    Coagulation Studies: No results for input(s): LABPROT, INR in the last 72 hours.  Urinalysis: No results for input(s): COLORURINE, LABSPEC, PHURINE, GLUCOSEU, HGBUR, BILIRUBINUR, KETONESUR, PROTEINUR, UROBILINOGEN, NITRITE, LEUKOCYTESUR in the last 72 hours.  Invalid input(s): APPERANCEUR    Imaging: No results found.   Medications:   . ciprofloxacin Stopped (10/16/19 0010)  . metronidazole 500 mg (10/16/19 0502)   . ascorbic acid  500 mg Oral BID  . atorvastatin  80 mg Oral Daily  .  balsalazide  750  mg Oral TID  . Chlorhexidine Gluconate Cloth  6 each Topical Daily  . collagenase  1 application Topical Daily  . digoxin  0.0625 mg Oral Q48H  . feeding supplement (NEPRO CARB STEADY)  237 mL Oral TID BM  . finasteride  5 mg Oral Daily  . influenza vaccine adjuvanted  0.5 mL Intramuscular Tomorrow-1000  . lactobacillus  1 g Oral TID WC  . loperamide  2 mg Oral Q8H  . midodrine  10 mg Oral TID  . multivitamin with minerals  1 tablet Oral Daily  . pantoprazole  40 mg Oral Daily  . potassium chloride  40 mEq Oral Q4H  . zinc sulfate  220 mg Oral Daily   acetaminophen **OR** acetaminophen, ipratropium-albuterol, ondansetron (ZOFRAN) IV  Assessment/ Plan:  [name] is a 69 y.o. white male  with diabetes mellitus type II, hypertension, obstructive sleep apnea, congestive heart failure, atrial fibrillation, hyperlipidemia, Crohn's disease, coronary artery disease, GERD, pacemaker,who was admitted to [facility] on 10/11/2019 for Diarrhea of infectious origin [A09] AKI (acute kidney injury) ([place]) [N17.9]  1. Acute renal failure with metabolic acidosis: with baseline creatinine of 0.82, GFR > 60 on 10/01/19.  with underlying diabetic nephropathy with glucosuria and proteinuria.  Secondary to diarrhea and prerenal azotemia.  Renal ultrasound negative. No IV contrast exposure.  Nonoliguric urine output. No indication for dialysis.   - Encourage PO intake.   2. Hypotension: holding blood pressure medications. Placed on digoxin by cardiology for atrial fibrillation.  - midodrine - discontinue tamsulosin.   3. Anemia with renal failure and thrombocytopenia: hemoglobin 7.4 Status post PRBC transfusion on 3/15.  4. Thrombocytopenia: off heparin infusion. Stopped linezolid.  Discussed with pharmacy, platelets should start improving within 2 weeks.  - HIT panel, consult hematology.   5. Hypokalemia: from GI losses - PO potassium   6. Bacteremia with sepsis: pseudomonas,  enterococcus and proteus on bone biopsy 09/26/19.  - Appreciate ID input. Cipro, metronidazole and daptomycin.     LOS: 5 Colton Lamb 3/17/202112:27 PM

## 2019-10-16 NOTE — Progress Notes (Signed)
Progress Note  Patient Name: Colton Lamb Date of Encounter: 10/16/2019  Primary Cardiologist: Ida Rogue, MD   Subjective   Patient doing okay.  Denies chest pain or shortness of breath.  Denies palpitations.  His platelets have stayed about the same the same compared to the past 2 days.  Inpatient Medications    Scheduled Meds: . ascorbic acid  500 mg Oral BID  . atorvastatin  80 mg Oral Daily  . balsalazide  750 mg Oral TID  . Chlorhexidine Gluconate Cloth  6 each Topical Daily  . collagenase  1 application Topical Daily  . digoxin  0.0625 mg Oral Q48H  . feeding supplement (NEPRO CARB STEADY)  237 mL Oral TID BM  . finasteride  5 mg Oral Daily  . influenza vaccine adjuvanted  0.5 mL Intramuscular Tomorrow-1000  . lactobacillus  1 g Oral TID WC  . loperamide  2 mg Oral Q8H  . midodrine  10 mg Oral TID  . multivitamin with minerals  1 tablet Oral Daily  . pantoprazole  40 mg Oral Daily  . potassium chloride  40 mEq Oral Q4H  . zinc sulfate  220 mg Oral Daily   Continuous Infusions: . ciprofloxacin Stopped (10/16/19 0010)  . metronidazole 500 mg (10/16/19 0502)   PRN Meds: acetaminophen **OR** acetaminophen, ipratropium-albuterol, ondansetron (ZOFRAN) IV   Vital Signs    Vitals:   10/16/19 0737 10/16/19 0740 10/16/19 0801 10/16/19 0901  BP: 92/65 93/63 93/65  (!) 85/67  Pulse: (!) 135 (!) 131 (!) 128 (!) 133  Resp: 19     Temp: 99.7 F (37.6 C)     TempSrc: Oral     SpO2: 100% 100% 100% 100%  Weight:      Height:        Intake/Output Summary (Last 24 hours) at 10/16/2019 1015 Last data filed at 10/16/2019 0507 Gross per 24 hour  Intake 1658.99 ml  Output 2225 ml  Net -566.01 ml   Last 3 Weights 10/15/2019 10/14/2019 10/13/2019  Weight (lbs) 177 lb 195 lb 192 lb 3.2 oz  Weight (kg) 80.287 kg 88.451 kg 87.181 kg      Telemetry    Atrial fibrillation, heart rate 120s- Personally Reviewed  ECG    No new tracing obtained- Personally  Reviewed  Physical Exam   GEN: No acute distress.   Neck: No JVD Cardiac:  Irregular irregular, no murmurs, rubs, or gallops.  Respiratory:  Decreased breath sounds at bases GI: Soft, nontender, non-distended  MS: No edema Neuro:  Nonfocal  Psych: Normal affect   Labs    High Sensitivity Troponin:  No results for input(s): TROPONINIHS in the last 720 hours.    Chemistry Recent Labs  Lab 10/11/19 0933 10/12/19 0821 10/14/19 0552 10/15/19 0433 10/16/19 0618  NA 139   < > 143 144 144  K 3.9   < > 3.9 3.5 3.0*  CL 107   < > 103 101 100  CO2 16*   < > 26 33* 34*  GLUCOSE 94   < > 111* 113* 100*  BUN 73*   < > 59* 55* 55*  CREATININE 4.64*   < > 3.68* 3.36* 2.92*  CALCIUM 7.9*   < > 7.6* 7.4* 7.4*  PROT 6.2*  --   --   --   --   ALBUMIN 2.1*  --   --   --   --   AST 19  --   --   --   --  ALT 15  --   --   --   --   ALKPHOS 88  --   --   --   --   BILITOT 0.9  --   --   --   --   GFRNONAA 12*   < > 16* 18* 21*  GFRAA 14*   < > 18* 21* 24*  ANIONGAP 16*   < > 14 10 10    < > = values in this interval not displayed.     Hematology Recent Labs  Lab 10/14/19 0552 10/15/19 0433 10/16/19 0618  WBC 9.7 10.6* 10.7*  RBC 2.94* 2.63* 2.70*  HGB 7.9* 7.3* 7.4*  HCT 25.7* 23.1* 23.5*  MCV 87.4 87.8 87.0  MCH 26.9 27.8 27.4  MCHC 30.7 31.6 31.5  RDW 21.3* 20.6* 20.0*  PLT 41* 36* 45*    BNP Recent Labs  Lab 10/11/19 0933  BNP 1,655.0*     DDimer No results for input(s): DDIMER in the last 168 hours.   Radiology    No results found.  Cardiac Studies   TTE 09/2019 1. Left ventricular ejection fraction, by estimation, is 25 to 30%. The  left ventricle has severely decreased function. The left ventricle  demonstrates regional wall motion abnormalities , anterior , anterospetal  and apical wall hypokinesis.  2. Right ventricular systolic function is normal. The right ventricular  size is normal. There is mildly elevated pulmonary artery systolic  pressure.   3. Left atrial size was mildly dilated.  4. No vegetation noted.   Patient Profile     69 y.o. 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 Atrial Fibrillation with rapid ventricular response -Heart rates currently 130s. -Continue digoxin at current renal dose -heparin was stopped due to thrombocytopenia and anemia -The patient's tachycardia is likely a physiologic response from infection/hypotension/sepsis. -Low blood pressures preventing the use of beta-blockers, calcium channel blockers amiodarone.  2. Heart failure reduced ejection fraction, last EF 25 to 30% status post ICD. -Low blood pressures preventing addition of heart failure therapy -Continue dig and midodrine as above for pressure support  3. Decubitus ulcer, sepsis, infection, diarrhea -abx as per primary team  Prognosis is guarded and patient with multiorgan failure.   Signed, Kate Sable, MD  10/16/2019, 10:15 AM

## 2019-10-16 NOTE — Consult Note (Signed)
Pharmacy Antibiotic Note  Colton Lamb is a 70 y.o. male admitted on 10/11/2019 with infected sacral decubitus ulcer. Patient with acute renal failure with Scr 4.64 (baseline 0.7-0.8). Recently admitted 2/22-3/3 for sepsis secondary to Pseudomonas bacteremia and osteomyelitis. He is on outpatient antibiotics including pip/tazo 13.5 g over 24h as a continuous infusion (planned stop date 3/22) and linezolid 600 mg q12h (planned stop date 3/15). Patient came in with pip/tazo infusion running now stopped. Both Zosyn and Linezolid have not been stopped. Pharmacy has been consulted for ciprofloxacin and daptomycin dosing. Patient is also on metronidazole.   2/25 wound cultures with: E faecium, PSA, and proteus  Patient currently afebrile. WBC 10.6. Blood cultures NGTD. Intake weight was 108 kg and subsequent weights were charted at 80 - 88 kg. Nursing staff re-checked today and weight was 111 kg. Most recent weight appears most consistent with last admission.  Plan: Ciprofloxacin 423m IV Q24 hours (renally adjusted)  Daptomycin 800 mg q48h (renally adjusted)  Weekly CK while on daptomycin. Baseline CK 12 on 10/01/2019. Continue to monitor renal function and adjust antibiotics as indicated.  Height: 5' 11"  (180.3 cm) Weight: 177 lb (80.3 kg) IBW/kg (Calculated) : 75.3  Temp (24hrs), Avg:98.2 F (36.8 C), Min:97.4 F (36.3 C), Max:99.7 F (37.6 C)  Recent Labs  Lab 10/11/19 0933 10/11/19 2315 10/12/19 0208 10/12/19 0821 10/12/19 1216 10/13/19 0702 10/13/19 1154 10/13/19 1923 10/14/19 0005 10/14/19 0552 10/15/19 0433 10/16/19 0618  WBC   < >  --   --  10.3  --  9.9  --   --   --  9.7 10.6* 10.7*  CREATININE   < >  --   --  4.42*   < > 3.99*   < > 3.83* 3.95* 3.68* 3.36* 2.92*  LATICACIDVEN  --  1.5 1.6  --   --   --   --   --   --   --   --   --    < > = values in this interval not displayed.    Estimated Creatinine Clearance: 25.8 mL/min (A) (by C-G formula based on SCr of 2.92  mg/dL (H)).    Allergies  Allergen Reactions  . Iodine Other (See Comments)    Shortness of breath, swelling and hives  . Shrimp [Shellfish Allergy] Other (See Comments)    SWELLING    HIVES    SHORTNESS OF BREATH  . Tetracycline Rash    Antimicrobials this admission: Daptomycin 3/17 >> Metronidazole 3/16 >> Ciprofloxacin 3/16 >> Linezolid 3/12 >> 3/14 Pip/tazo 3/13 >> 3/16  Dose adjustments this admission: n/a  Microbiology results: 3/12 BCx: NGTD 3/12 C diff: negative 3/12 GI panel: negative  Thank you for allowing pharmacy to be a part of this patient's care.  APrinevilleResident 10/16/2019 9:23 AM

## 2019-10-17 DIAGNOSIS — M8660 Other chronic osteomyelitis, unspecified site: Secondary | ICD-10-CM

## 2019-10-17 DIAGNOSIS — I480 Paroxysmal atrial fibrillation: Secondary | ICD-10-CM

## 2019-10-17 DIAGNOSIS — I2510 Atherosclerotic heart disease of native coronary artery without angina pectoris: Secondary | ICD-10-CM

## 2019-10-17 DIAGNOSIS — Z9861 Coronary angioplasty status: Secondary | ICD-10-CM

## 2019-10-17 LAB — CBC WITH DIFFERENTIAL/PLATELET
Abs Immature Granulocytes: 0.08 10*3/uL — ABNORMAL HIGH (ref 0.00–0.07)
Basophils Absolute: 0.1 10*3/uL (ref 0.0–0.1)
Basophils Relative: 1 %
Eosinophils Absolute: 0.5 10*3/uL (ref 0.0–0.5)
Eosinophils Relative: 5 %
HCT: 22.2 % — ABNORMAL LOW (ref 39.0–52.0)
Hemoglobin: 7.2 g/dL — ABNORMAL LOW (ref 13.0–17.0)
Immature Granulocytes: 1 %
Lymphocytes Relative: 14 %
Lymphs Abs: 1.4 10*3/uL (ref 0.7–4.0)
MCH: 27.6 pg (ref 26.0–34.0)
MCHC: 32.4 g/dL (ref 30.0–36.0)
MCV: 85.1 fL (ref 80.0–100.0)
Monocytes Absolute: 0.8 10*3/uL (ref 0.1–1.0)
Monocytes Relative: 8 %
Neutro Abs: 7.3 10*3/uL (ref 1.7–7.7)
Neutrophils Relative %: 71 %
Platelets: 68 10*3/uL — ABNORMAL LOW (ref 150–400)
RBC: 2.61 MIL/uL — ABNORMAL LOW (ref 4.22–5.81)
RDW: 19.9 % — ABNORMAL HIGH (ref 11.5–15.5)
WBC: 10.1 10*3/uL (ref 4.0–10.5)
nRBC: 0 % (ref 0.0–0.2)

## 2019-10-17 LAB — BASIC METABOLIC PANEL
Anion gap: 8 (ref 5–15)
BUN: 45 mg/dL — ABNORMAL HIGH (ref 8–23)
CO2: 33 mmol/L — ABNORMAL HIGH (ref 22–32)
Calcium: 7.7 mg/dL — ABNORMAL LOW (ref 8.9–10.3)
Chloride: 102 mmol/L (ref 98–111)
Creatinine, Ser: 2.54 mg/dL — ABNORMAL HIGH (ref 0.61–1.24)
GFR calc Af Amer: 29 mL/min — ABNORMAL LOW (ref >60)
GFR calc non Af Amer: 25 mL/min — ABNORMAL LOW (ref >60)
Glucose, Bld: 109 mg/dL — ABNORMAL HIGH (ref 70–99)
Potassium: 3.4 mmol/L — ABNORMAL LOW (ref 3.5–5.1)
Sodium: 143 mmol/L (ref 135–145)

## 2019-10-17 LAB — IRON AND TIBC
Iron: 18 ug/dL — ABNORMAL LOW (ref 45–182)
Saturation Ratios: 12 % — ABNORMAL LOW (ref 17.9–39.5)
TIBC: 150 ug/dL — ABNORMAL LOW (ref 250–450)
UIBC: 132 ug/dL

## 2019-10-17 LAB — HEPARIN INDUCED PLATELET AB (HIT ANTIBODY): Heparin Induced Plt Ab: 0.122 OD (ref 0.000–0.400)

## 2019-10-17 LAB — VITAMIN B12: Vitamin B-12: 596 pg/mL (ref 180–914)

## 2019-10-17 LAB — CK: Total CK: 22 U/L — ABNORMAL LOW (ref 49–397)

## 2019-10-17 LAB — FERRITIN: Ferritin: 255 ng/mL (ref 24–336)

## 2019-10-17 LAB — FOLATE: Folate: 5 ng/mL — ABNORMAL LOW (ref >5.9)

## 2019-10-17 LAB — MAGNESIUM: Magnesium: 1.5 mg/dL — ABNORMAL LOW (ref 1.7–2.4)

## 2019-10-17 LAB — PHOSPHORUS: Phosphorus: 1.2 mg/dL — ABNORMAL LOW (ref 2.5–4.6)

## 2019-10-17 MED ORDER — CIPROFLOXACIN IN D5W 400 MG/200ML IV SOLN
400.0000 mg | Freq: Two times a day (BID) | INTRAVENOUS | Status: DC
  Administered 2019-10-17 – 2019-10-22 (×10): 400 mg via INTRAVENOUS
  Filled 2019-10-17 (×12): qty 200

## 2019-10-17 MED ORDER — POTASSIUM PHOSPHATES 15 MMOLE/5ML IV SOLN
30.0000 mmol | Freq: Once | INTRAVENOUS | Status: AC
  Administered 2019-10-17: 30 mmol via INTRAVENOUS
  Filled 2019-10-17: qty 10

## 2019-10-17 MED ORDER — SODIUM CHLORIDE 0.9 % IV SOLN
800.0000 mg | INTRAVENOUS | Status: AC
  Administered 2019-10-17 – 2019-10-21 (×5): 800 mg via INTRAVENOUS
  Filled 2019-10-17 (×5): qty 16

## 2019-10-17 MED ORDER — ATORVASTATIN CALCIUM 20 MG PO TABS: 20.0000 mg | ORAL_TABLET | Freq: Every day | ORAL | Status: DC

## 2019-10-17 MED ORDER — MAGNESIUM SULFATE 2 GM/50ML IV SOLN
2.0000 g | Freq: Once | INTRAVENOUS | Status: AC
  Administered 2019-10-17: 2 g via INTRAVENOUS
  Filled 2019-10-17: qty 50

## 2019-10-17 NOTE — Progress Notes (Signed)
Progress Note  Patient Name: Colton Lamb Date of Encounter: 10/17/2019  Primary Cardiologist: Ida Rogue, MD  Subjective   Feels well this AM.  No c/p, dyspnea, palpitations, diarrhea.  Inpatient Medications    Scheduled Meds: . ascorbic acid  500 mg Oral BID  . atorvastatin  20 mg Oral Daily  . balsalazide  750 mg Oral TID  . Chlorhexidine Gluconate Cloth  6 each Topical Daily  . collagenase  1 application Topical Daily  . digoxin  0.0625 mg Oral Q48H  . feeding supplement (NEPRO CARB STEADY)  237 mL Oral TID BM  . finasteride  5 mg Oral Daily  . influenza vaccine adjuvanted  0.5 mL Intramuscular Tomorrow-1000  . lactobacillus  1 g Oral TID WC  . loperamide  2 mg Oral Q8H  . midodrine  10 mg Oral TID  . multivitamin with minerals  1 tablet Oral Daily  . pantoprazole  40 mg Oral Daily  . zinc sulfate  220 mg Oral Daily   Continuous Infusions: . sodium chloride 10 mL/hr at 10/17/19 0845  . ciprofloxacin Stopped (10/16/19 2347)  . DAPTOmycin (CUBICIN)  IV Stopped (10/16/19 1625)  . metronidazole 100 mL/hr at 10/17/19 0855  . potassium PHOSPHATE IVPB (in mmol)     PRN Meds: sodium chloride, acetaminophen **OR** acetaminophen, ipratropium-albuterol, ondansetron (ZOFRAN) IV   Vital Signs    Vitals:   10/16/19 1700 10/16/19 1940 10/17/19 0515 10/17/19 0742  BP: 103/67 106/66 105/65 116/66  Pulse: 91 (!) 101 100 98  Resp:  20 20   Temp:  97.9 F (36.6 C) 98 F (36.7 C)   TempSrc:  Oral Oral   SpO2:  99% 100% 100%  Weight:      Height:        Intake/Output Summary (Last 24 hours) at 10/17/2019 0949 Last data filed at 10/17/2019 0904 Gross per 24 hour  Intake 663.4 ml  Output 2950 ml  Net -2286.6 ml   Filed Weights   10/14/19 0412 10/15/19 0351 10/16/19 1300  Weight: 88.5 kg 80.3 kg 111.6 kg    Physical Exam   GEN: Well nourished, well developed, in no acute distress.  HEENT: Grossly normal.  Neck: Supple, no JVD, carotid bruits, or  masses. Cardiac: RRR, no murmurs, rubs, or gallops. No clubbing, cyanosis, edema.  Radials/DP/PT 1+ and equal bilaterally.  Respiratory:  Respirations regular and unlabored, diminished breath sounds @ bilateral bases, otw CTA. GI: Soft, nontender, nondistended, BS + x 4. MS: no deformity or atrophy. Skin: warm and dry, no rash. Neuro:  Strength and sensation are intact. Psych: AAOx3.  Normal affect.  Labs    Chemistry Recent Labs  Lab 10/11/19 0933 10/12/19 0821 10/15/19 0433 10/16/19 0618 10/17/19 0514  NA 139   < > 144 144 143  K 3.9   < > 3.5 3.0* 3.4*  CL 107   < > 101 100 102  CO2 16*   < > 33* 34* 33*  GLUCOSE 94   < > 113* 100* 109*  BUN 73*   < > 55* 55* 45*  CREATININE 4.64*   < > 3.36* 2.92* 2.54*  CALCIUM 7.9*   < > 7.4* 7.4* 7.7*  PROT 6.2*  --   --   --   --   ALBUMIN 2.1*  --   --   --   --   AST 19  --   --   --   --   ALT 15  --   --   --   --  ALKPHOS 88  --   --   --   --   BILITOT 0.9  --   --   --   --   GFRNONAA 12*   < > 18* 21* 25*  GFRAA 14*   < > 21* 24* 29*  ANIONGAP 16*   < > 10 10 8    < > = values in this interval not displayed.     Hematology Recent Labs  Lab 10/15/19 0433 10/16/19 0618 10/17/19 0514  WBC 10.6* 10.7* 10.1  RBC 2.63* 2.70* 2.61*  HGB 7.3* 7.4* 7.2*  HCT 23.1* 23.5* 22.2*  MCV 87.8 87.0 85.1  MCH 27.8 27.4 27.6  MCHC 31.6 31.5 32.4  RDW 20.6* 20.0* 19.9*  PLT 36* 45* 68*      BNP Recent Labs  Lab 10/11/19 0933  BNP 1,655.0*     Radiology    No results found.  Telemetry    Maintaining sinus w/ PACs since 3/17 late morning - Personally Reviewed  Cardiac Studies   2D Echocardiogram 09/2019  TTE 09/2019 1. Left ventricular ejection fraction, by estimation, is 25 to 30%. The  left ventricle has severely decreased function. The left ventricle  demonstrates regional wall motion abnormalities , anterior , anterospetal  and apical wall hypokinesis.   2. Right ventricular systolic function is normal. The  right ventricular  size is normal. There is mildly elevated pulmonary artery systolic  pressure.   3. Left atrial size was mildly dilated.   4. No vegetation noted.    Patient Profile     69 y.o. 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  Assessment & Plan    1.  Diarrhea: improved.    2.  Acute renal failure:  In setting of above.  Creat cont to improve.  Renal following.  3.  Pseudomonal bacteremia/chronic sacral ulcer: abx per ID.  4.  PAF w/ RVR: In setting of #1/#2.  Maintaining sinus rhythm since 3/17 AM. On low dose digoxin - dose reduced this admission in setting of ARF.  Dig levels ok.  H/o hypotension has prevented usage of AVN blocking agents. Xarelto remains on hold in setting of ongoing anemia and thrombocytopenia (2/2 zosyn & linezolid usage).  Plan to resume xarelto when plts normalize, provided H/H stable.  If for some reason resumption of [place] not feasible, could consider adding antiarrhythmic rx to hopefully prevent future episodes of afib. Was prev on amio in 2015 - will have to research why this was d/c'd.  5.  HFrEF/ICM: EF 25-30%.  Euvolemic on exam. Lying flat w/o difficulty. Minus 1.65L overnight, minus 6.7L since admission.  Wt inaccurate.  No  blocker, acei/arb/arni/mra 2/2 low blood pressures.  Cont low dose digoxin and follow digoxin level in setting of ARF.  6.  Microcytic anemia:  H/H low but stable.  7.  Thrombocytopenia:  Felt to be 2/2 zosyn/linezolid.  Improving.  Heme following.  8.  CAD:  No chest pain.  Cont statin rx.  No asa in setting of chronic xarelto.  9.  HL:  Cont statin.  LDL 52 in 12/2018.  Signed, Murray Hodgkins, NP  10/17/2019, 9:49 AM    For questions or  updates, please contact   Please consult [email or web address] for contact info under Cardiology/STEMI.

## 2019-10-17 NOTE — Progress Notes (Signed)
[place] INFECTIOUS DISEASE PROGRESS NOTE Date of Admission:  10/11/2019     ID: Colton Lamb is a 69 y.o. male with Pseudomonal bacteremia and chronic sacral ulcer Principal Problem:   Acute renal failure (ARF) ([name]) Active Problems:   Hyperlipidemia   CAD S/P percutaneous coronary angioplasty - multiple PCIs   Atrial fibrillation (HCC)   GERD   CROHN'S DISEASE-LARGE & SMALL INTESTINE   Hypertension, essential   Chronic systolic CHF (congestive heart failure) (HCC)   Normocytic anemia   Chronic osteomyelitis ([name])   Colostomy in place [facility])   Bacteremia due to Pseudomonas   Diarrhea   COVID-19 virus infection   Thrombocytopenia (HCC)   Sacral decubitus ulcer, stage IV ([name])   Subjective:  3/18 - no fevers, wound stable per RN. Denies diarrhea  Patient previously followed while hospitalized.  Now readmitted with acute renal failure and diarrhea.  He had been discharged on linezolid and Zosyn.  On admission he had slightly elevated white count but new thrombocytopenia.  The thrombocytopenia has worsened.  Yesterday when I was called regarding this we advised stop the linezolid and Zosyn as possible causes of the thrombocytopenia.  I started him on ciprofloxacin and Flagyl.  He is seen this morning.  He reports that his diarrhea is resolved.  His tells me that his wound is improving.  He says he was readmitted because he could not be cared for at home but is not sure of the ongoing plan.  ROS  Eleven systems are reviewed and negative except per hpi  Medications:  Antibiotics Given (last 72 hours)    Date/Time Action Medication Dose Rate   10/14/19 2243 New Bag/Given   piperacillin-tazobactam (ZOSYN) IVPB 3.375 g 3.375 g 12.5 mL/hr   10/15/19 0930 New Bag/Given   piperacillin-tazobactam (ZOSYN) IVPB 3.375 g 3.375 g 12.5 mL/hr   10/15/19 1445 New Bag/Given   piperacillin-tazobactam (ZOSYN) IVPB 3.375 g 3.375 g 12.5 mL/hr   10/15/19 2258 New Bag/Given   metroNIDAZOLE  (FLAGYL) IVPB 500 mg 500 mg 100 mL/hr   10/15/19 2258 New Bag/Given   ciprofloxacin (CIPRO) IVPB 400 mg 400 mg 200 mL/hr   10/16/19 0502 New Bag/Given   metroNIDAZOLE (FLAGYL) IVPB 500 mg 500 mg 100 mL/hr   10/16/19 1404 New Bag/Given   metroNIDAZOLE (FLAGYL) IVPB 500 mg 500 mg 100 mL/hr   10/16/19 1539 New Bag/Given   DAPTOmycin (CUBICIN) 800 mg in sodium chloride 0.9 % IVPB 800 mg 232 mL/hr   10/16/19 2145 New Bag/Given   ciprofloxacin (CIPRO) IVPB 400 mg 400 mg 200 mL/hr   10/16/19 2252 New Bag/Given   metroNIDAZOLE (FLAGYL) IVPB 500 mg 500 mg 100 mL/hr   10/17/19 0846 New Bag/Given   metroNIDAZOLE (FLAGYL) IVPB 500 mg 500 mg 100 mL/hr     . ascorbic acid  500 mg Oral BID  . atorvastatin  20 mg Oral Daily  . balsalazide  750 mg Oral TID  . Chlorhexidine Gluconate Cloth  6 each Topical Daily  . collagenase  1 application Topical Daily  . digoxin  0.0625 mg Oral Q48H  . feeding supplement (NEPRO CARB STEADY)  237 mL Oral TID BM  . finasteride  5 mg Oral Daily  . influenza vaccine adjuvanted  0.5 mL Intramuscular Tomorrow-1000  . lactobacillus  1 g Oral TID WC  . loperamide  2 mg Oral Q8H  . midodrine  10 mg Oral TID  . multivitamin with minerals  1 tablet Oral Daily  . pantoprazole  40  mg Oral Daily  . zinc sulfate  220 mg Oral Daily    Objective: Vital signs in last 24 hours: Temp:  [97.9 F (36.6 C)-98.1 F (36.7 C)] 98.1 F (36.7 C) (03/18 1129) Pulse Rate:  [72-101] 72 (03/18 1129) Resp:  [18-20] 18 (03/18 1129) BP: (103-116)/(65-78) 104/78 (03/18 1129) SpO2:  [99 %-100 %] 100 % (03/18 1129) Physical Exam  Constitutional: chronically ill appearing HENT: anicteric Mouth/Throat: Oropharynx is clear and moist. No oropharyngeal exudate.  Cardiovascular: Normal rate, regular rhythm and normal heart sounds. Pulmonary/Chest: Effort normal and breath sounds normal. No respiratory distress. He has no wheezes.  Abdominal: Soft. Bowel sounds are normal. He exhibits no  distension. There is no tenderness. Colostomy in place Lymphadenopathy:  He has no cervical adenopathy.  Neurological: He is alert and oriented to person, place, and time.  Integumentary: per wound note 3/14 -  Wound type: Stage 4 Pressure Injury Pressure Injury POA: Yes Measurement: 6cm x 9.5cm x 3.0cm Wound bed:100% clean, minimal fibrinous material  Drainage (amount, consistency, odor) moderate, yellow, no odor Periwound: intact, severe epibole of the wound edges  Dressing procedure/placement/frequency: Chronic non healing pressure injury: saline moist dressings BID, cover with ABD pad, secure with tape.   Lab Results Recent Labs    10/16/19 0618 10/17/19 0514  WBC 10.7* 10.1  HGB 7.4* 7.2*  HCT 23.5* 22.2*  NA 144 143  K 3.0* 3.4*  CL 100 102  CO2 34* 33*  BUN 55* 45*  CREATININE 2.92* 2.54*    Microbiology: Results for orders placed or performed during the hospital encounter of 10/11/19  GI pathogen panel by PCR, stool     Status: None   Collection Time: 10/11/19  9:33 AM   Specimen: STOOL  Result Value Ref Range Status   Plesiomonas shigelloides NOT DETECTED NOT DETECTED Final   Yersinia enterocolitica NOT DETECTED NOT DETECTED Final   Vibrio NOT DETECTED NOT DETECTED Final   Enteropathogenic E coli NOT DETECTED NOT DETECTED Final   E coli (ETEC) LT/ST NOT DETECTED NOT DETECTED Final   E coli 2595 by PCR Not applicable NOT DETECTED Final   Cryptosporidium by PCR NOT DETECTED NOT DETECTED Final   Entamoeba histolytica NOT DETECTED NOT DETECTED Final   Adenovirus F 40/41 NOT DETECTED NOT DETECTED Final   Norovirus GI/GII NOT DETECTED NOT DETECTED Final   Sapovirus NOT DETECTED NOT DETECTED Final    Comment: (NOTE) Performed At: [facility] [place], [place] [id number] Rush Farmer MD IR:[id number]    Vibrio cholerae NOT DETECTED NOT DETECTED Final   Campylobacter by PCR NOT DETECTED NOT DETECTED Final   Salmonella by PCR NOT DETECTED  NOT DETECTED Final   E coli (STEC) NOT DETECTED NOT DETECTED Final   Enteroaggregative E coli NOT DETECTED NOT DETECTED Final   Shigella by PCR NOT DETECTED NOT DETECTED Final   Cyclospora cayetanensis NOT DETECTED NOT DETECTED Final   Astrovirus NOT DETECTED NOT DETECTED Final   G lamblia by PCR NOT DETECTED NOT DETECTED Final   Rotavirus A by PCR NOT DETECTED NOT DETECTED Final  C Difficile Quick Screen w PCR reflex     Status: None   Collection Time: 10/11/19  9:33 AM   Specimen: STOOL  Result Value Ref Range Status   C Diff antigen NEGATIVE NEGATIVE Final   C Diff toxin NEGATIVE NEGATIVE Final   C Diff interpretation No C. difficile detected.  Final    Comment: Performed at [facility], [phone]  [address] [place], [place] [address]  Culture, blood (Routine X 2) w Reflex to ID Panel     Status: None   Collection Time: 10/11/19  4:34 PM   Specimen: BLOOD RIGHT HAND  Result Value Ref Range Status   Specimen Description BLOOD RIGHT HAND  Final   Special Requests   Final    BOTTLES DRAWN AEROBIC ONLY Blood Culture results may not be optimal due to an inadequate volume of blood received in culture bottles   Culture   Final    NO GROWTH 5 DAYS Performed at [facility], [address] [place], [place] [address]    Report Status 10/16/2019 FINAL  Final    Studies/Results: Left ventricular ejection fraction, by estimation, is 25 to 30%. The left ventricle has severely decreased function. The left ventricle demonstrates regional wall motion abnormalities , anterior , anterospetal and apical wall hypokinesis. 2. Right ventricular systolic function is normal. The right ventricular size is normal. There is mildly elevated pulmonary artery systolic pressure. 3. Left atrial size was mildly dilated. 4. No vegetation noted. IMPRESSION: Large presacral decubitus ulcer with progressive bone destruction of the distal sacrum and coccyx consistent with osteomyelitis. Chronic  nonspecific sclerosis within S1 segment of sacrum. Prostatic enlargement with mild bladder wall thickening which could represent muscular hypertrophy from chronic bladder outlet obstruction or from chronic cystitis. Nonobstructing 4 mm calculus distal LEFT ureter. Double-barrel colostomy LEFT lower quadrant.  Assessment/Plan: 69 year old gentleman is largely bedbound,Has hypertension, hyperlipidemia, diabetes, GERD, depression, proximal A. fib, obstructive sleep apnea, Crohn's disease status post colostomy, indwelling Foley catheter, chronic systolic CHF.  He was recently admitted for a prolonged hospital course with osteomyelitis and sacral wound.  He also had pseudomonal bacteremia.  Cultures from his wound were mixed.  He was discharged on Zosyn and linezolid.  He was readmitted after failure to thrive at home with acute renal failure.  He is also developed thrombocytopenia.  He had completed a 2-week course of linezolid for VRE.  His wound per wound care is clean with minimal evidence of necrosis. Yesterday we stopped his Zosyn.  Is been on linezolid for several days now however his platelets continue to drop.  Pseudomonas bacteremia- source is his long term decub ulcer which also grew Pseudomonas, VRE and proteus and anaerobes from bone biopsy. He has AICD and hence risk  for endocarditis , lead vegetation but TTE is negative. Per cards he is not a great TEE candidate.  Given the diarrhea and the thrombocytopenia I have changed his Zosyn to Cipro and Flagyl and will continue this for now..  Stage IV sacral decubitus with chronic osteomyelitis -at this point will continue the Cipro Flagyl as above.  We will also cont daptomycin while he is inpatient. Continue with aggressive wound care.Marland Kitchen  TCP -likely induced by linezolid- increasing today - cont to follow  Thank you very much for the consult. Will follow with you.  Leonel Ramsay    10/17/2019, 3:12 PM

## 2019-10-17 NOTE — Consult Note (Addendum)
Pharmacy Antibiotic Note  Colton Lamb is a 69 y.o. male admitted on 10/11/2019 with infected sacral decubitus ulcer. Patient with acute renal failure with Scr 4.64 (baseline 0.7-0.8). Recently admitted 2/22-3/3 for sepsis secondary to Pseudomonas bacteremia and osteomyelitis. He is on outpatient antibiotics including pip/tazo 13.5 g over 24h as a continuous infusion (planned stop date 3/22) and linezolid 600 mg q12h (planned stop date 3/15). Patient came in with pip/tazo infusion running now stopped. Both Zosyn and Linezolid have not been stopped. Pharmacy has been consulted for ciprofloxacin and daptomycin dosing. Patient is also on metronidazole.   2/25 wound cultures with: E faecium, PSA, and proteus  Patient currently afebrile. WBC 10.6. Blood cultures NGTD. Intake weight was 108 kg and subsequent weights were charted at 80 - 88 kg. Nursing staff re-checked today and weight was 111 kg. Most recent weight appears most consistent with last admission.  Plan: Ciprofloxacin 479m IV Q12 hours.  Daptomycin 800 mg q48h changed to q24h due to CrCl > 30. CK is WNL.   Weekly CK while on daptomycin. Baseline CK 12 on 10/01/2019. Continue to monitor renal function and adjust antibiotics as indicated.  Height: 5' 11"  (180.3 cm) Weight: 246 lb (111.6 kg) IBW/kg (Calculated) : 75.3  Temp (24hrs), Avg:98 F (36.7 C), Min:97.9 F (36.6 C), Max:98.1 F (36.7 C)  Recent Labs  Lab 10/11/19 2315 10/12/19 0208 10/12/19 0821 10/13/19 0702 10/13/19 1154 10/14/19 0005 10/14/19 0552 10/15/19 0433 10/16/19 0618 10/17/19 0514  WBC  --   --    < > 9.9  --   --  9.7 10.6* 10.7* 10.1  CREATININE  --   --    < > 3.99*   < > 3.95* 3.68* 3.36* 2.92* 2.54*  LATICACIDVEN 1.5 1.6  --   --   --   --   --   --   --   --    < > = values in this interval not displayed.    Estimated Creatinine Clearance: 35.4 mL/min (A) (by C-G formula based on SCr of 2.54 mg/dL (H)).    Allergies  Allergen Reactions  .  Iodine Other (See Comments)    Shortness of breath, swelling and hives  . Shrimp [Shellfish Allergy] Other (See Comments)    SWELLING    HIVES    SHORTNESS OF BREATH  . Tetracycline Rash    Antimicrobials this admission: Daptomycin 3/17 >> Metronidazole 3/16 >> Ciprofloxacin 3/16 >> Linezolid 3/12 >> 3/14 Pip/tazo 3/13 >> 3/16  Dose adjustments this admission: n/a  Microbiology results: 3/12 BCx: NGTD 3/12 C diff: negative 3/12 GI panel: negative  Thank you for allowing pharmacy to be a part of this patient's care.  KOswald Hillock PharmD, BCPS 10/17/2019 11:34 AM

## 2019-10-17 NOTE — TOC Initial Note (Signed)
Transition of Care [facility]) - Initial/Assessment Note    Patient Details  Name: Colton Lamb MRN: [id number] Date of Birth: 1950-12-26  Transition of Care [facility]) CM/SW Contact:    Eileen Stanford, LCSW Phone Number: 10/17/2019, 9:58 AM  Clinical Narrative: CSW spoke with pt at bedside to discuss discharge plan. Pt is agreeable to SNF however, not agreeable to [facility] or [facility]. Pt lives home alone. Pt agrees he can not d/c home alone. CSW will send referral.                  Expected Discharge Plan: Skilled Nursing Facility Barriers to Discharge: Continued Medical Work up   Patient Goals and CMS Choice Patient states their goals for this hospitalization and ongoing recovery are:: to get stronger   Choice offered to / list presented to : Patient  Expected Discharge Plan and Services Expected Discharge Plan: [place] In-house Referral: Clinical Social Work   Post Acute Care Choice: [place] Living arrangements for the past 2 months: [place]                                      Prior Living Arrangements/Services Living arrangements for the past 2 months: Single Family Home Lives with:: Self Patient language and need for interpreter reviewed:: Yes Do you feel safe going back to the place where you live?: Yes      Need for Family Participation in Patient Care: Yes (Comment) Care giver support system in place?: No (comment)   Criminal Activity/Legal Involvement Pertinent to Current Situation/Hospitalization: No - Comment as needed  Activities of Daily Living Home Assistive Devices/Equipment: Eyeglasses, Gilford Rile (specify type) ADL Screening (condition at time of admission) Patient's cognitive ability adequate to safely complete daily activities?: No Is the patient deaf or have difficulty hearing?: No Does the patient have difficulty seeing, even when wearing glasses/contacts?: No Does the patient have difficulty  concentrating, remembering, or making decisions?: No Patient able to express need for assistance with ADLs?: No Does the patient have difficulty dressing or bathing?: Yes Independently performs ADLs?: No Does the patient have difficulty walking or climbing stairs?: Yes Weakness of Legs: Both Weakness of Arms/Hands: None  Permission Sought/Granted                  Emotional Assessment Appearance:: Appears stated age Attitude/Demeanor/Rapport: Engaged Affect (typically observed): Accepting, Appropriate Orientation: : Oriented to Situation, Oriented to  Time, Oriented to Place, Oriented to Self Alcohol / Substance Use: Not Applicable Psych Involvement: No (comment)  Admission diagnosis:  Diarrhea of infectious origin [A09] AKI (acute kidney injury) ([place]) [N17.9] Patient Active Problem List   Diagnosis Date Noted  . Diarrhea 10/11/2019  . COVID-19 virus infection 10/11/2019  . Thrombocytopenia ([place]) 10/11/2019  . Sacral decubitus ulcer, stage IV ([place]) 10/11/2019  . Encounter for competency evaluation   . Acute respiratory distress syndrome (ARDS) due to COVID-19 virus ([place]) 09/29/2019  . Bacteremia due to Pseudomonas 09/29/2019  . Goals of care, counseling/discussion   . Palliative care by specialist   . HCAP (healthcare-associated pneumonia) 09/23/2019  . Colostomy in place [facility]) 09/23/2019  . Bleeding 07/11/2019  . Chronic osteomyelitis ([place]) 06/30/2019  . PICC (peripherally inserted central catheter) flush   . Normocytic anemia 06/21/2019  . Decubitus ulcer of sacral region, unstageable ([place]) 06/19/2019  . Hypotension 06/19/2019  . Anemia   . Acute  respiratory failure ([place]) 06/12/2019  . Chronic systolic CHF (congestive heart failure) ([place])   . Acute blood loss anemia   . Pressure injury of skin 03/17/2019  . Acute renal failure (ARF) ([place])   . Empyema lung ([place])   . Shortness of breath   . Pleural effusion on right   . Sepsis ([place]) 03/04/2019  . Restless legs  syndrome (RLS) 06/16/2017  . Hypertension, essential 06/16/2017  . CAD in native artery 04/27/2017  . Hydrocephalus ([place]) 09/08/2016  . Dizziness and giddiness   . Elevated troponin 09/06/2016  . Type II diabetes mellitus ([place])   . Ischemic cardiomyopathy   . Hyponatremia 07/01/2015  . Diabetes mellitus type 2, uncontrolled, with complications ([place]) 08/09/3233  . 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 OSA (obstructive sleep apnea) 12/20/2013  . Morbid obesity ([place]) 12/20/2013  . AKI (acute kidney injury) - Creatinine improved at d/c 12/14/2013  . Elevated TSH - will need f/u TFTs with PCP in 3-4 weeks 12/14/2013  . Junctional bradycardia - resolved 12/14/2013  . Syncope - due to bradycardia in setting of Digoxin Toxicity 12/11/2013  . Acute on chronic HFrEF (heart failure with reduced ejection fraction) ([place]) 12/06/2013  . At risk for sudden cardiac death - on LifeVest 12-03-2013  . Acute on chronic combined systolic and diastolic CHF (congestive heart failure) ([place]) 12-03-13  . Cardiomyopathy, ischemic 2013-12-03  . Atrial flutter ([place]) 11/22/2013  . NSTEMI (non-ST elevated myocardial infarction) ([place]) 11/22/2013  . Long term current use of anticoagulant 10/19/2010  . Hyperlipidemia 05/06/2010  . Edema 05/06/2010  . CAD S/P percutaneous coronary angioplasty - multiple PCIs 11/11/2008  . Atrial fibrillation ([place]) 11/11/2008  . GERD 11/11/2008  . [place] INTESTINE 11/11/2008  . COLONIC POLYPS, HX OF 11/11/2008  . [place] ALLERGY 11/11/2008   PCP:  [name], MD Pharmacy:   [id number] [place] [place] [address]Phone: [phone]: [phone] [facility] [place] [id number]: [phone]: [phone]    Social Determinants of Health  (SDOH) Interventions    Readmission Risk Interventions Readmission Risk Prevention Plan 10/14/2019 10/14/2019 07/18/2019  Transportation Screening Complete Complete Complete  Social Work Consult for [place]- - -  Medication Review (Press photographer Complete Complete Complete  PCP or Specialist appointment within 3-5 days of discharge Complete Complete (No Data)  H[place]or H[place] ParkComplete Complete -  HCharlottesvilleor HMarysvillePt Refusal Comments - - return to SNF  SW Recovery Care/Counseling Consult Complete Complete -  Palliative Care Screening Not Applicable Not Applicable -  SFort JenningsNot Applicable Not Applicable Complete  Some recent data might be hidden

## 2019-10-17 NOTE — Progress Notes (Signed)
[facility]  ROUNDING NOTE   Subjective:   No complaints. Claims to be eating well. Denies any significant diarrhea.   Objective:  Vital signs in last 24 hours:  Temp:  [97.9 F (36.6 C)-98 F (36.7 C)] 98 F (36.7 C) (03/18 0515) Pulse Rate:  [79-101] 98 (03/18 0742) Resp:  [20] 20 (03/18 0515) BP: (87-116)/(56-71) 116/66 (03/18 0742) SpO2:  [99 %-100 %] 100 % (03/18 0742) Weight:  [111.6 kg] 111.6 kg (03/17 1300)  Weight change:  Filed Weights   10/14/19 0412 10/15/19 0351 10/16/19 1300  Weight: 88.5 kg 80.3 kg 111.6 kg    Intake/Output: I/O last 3 completed shifts: In: 1286 [P.O.:237; I.V.:30.6; IV Piggyback:1018.3] Out: 2650 [Urine:2200; Stool:450]   Intake/Output this shift:  Total I/O In: 100 [IV Piggyback:100] Out: 1750 [Urine:1600; Stool:150]  Physical Exam: General: NAD, laying in bed  Head: Normocephalic, atraumatic. Moist oral mucosal membranes  Eyes: Anicteric, PERRL  Neck: Supple, trachea midline  Lungs:  Clear to auscultation  Heart: Regular rate and rhythm  Abdomen:  Soft, nontender,   Extremities:  no peripheral edema.  Neurologic: Nonfocal, moving all four extremities   Skin Sacral decub, covered and clean       Basic Metabolic Panel: Recent Labs  Lab 10/12/19 0821 10/12/19 1216 10/14/19 0005 10/14/19 0005 10/14/19 0552 10/14/19 0552 10/15/19 0433 10/16/19 0618 10/17/19 0514  NA 142   < > 143  --  143  --  144 144 143  K 3.6   < > 3.6  --  3.9  --  3.5 3.0* 3.4*  CL 107   < > 103  --  103  --  101 100 102  CO2 18*   < > 25  --  26  --  33* 34* 33*  GLUCOSE 90   < > 122*  --  111*  --  113* 100* 109*  BUN 69*   < > 62*  --  59*  --  55* 55* 45*  CREATININE 4.42*   < > 3.95*  --  3.68*  --  3.36* 2.92* 2.54*  CALCIUM 7.7*   < > 7.5*   < > 7.6*   < > 7.4* 7.4* 7.7*  MG 1.8  --   --   --   --   --   --  1.3* 1.5*  PHOS  --   --   --   --   --   --   --   --  1.2*   < > = values in this interval not displayed.     Liver Function Tests: Recent Labs  Lab 10/11/19 0933  AST 19  ALT 15  ALKPHOS 88  BILITOT 0.9  PROT 6.2*  ALBUMIN 2.1*   No results for input(s): LIPASE, AMYLASE in the last 168 hours. No results for input(s): AMMONIA in the last 168 hours.  CBC: Recent Labs  Lab 10/11/19 0933 10/12/19 0821 10/13/19 0702 10/13/19 0702 10/13/19 1923 10/14/19 0552 10/15/19 0433 10/16/19 0618 10/17/19 0514  WBC 12.0*   < > 9.9  --   --  9.7 10.6* 10.7* 10.1  NEUTROABS 9.9*  --   --   --   --   --   --   --  PENDING  HGB 8.5*   < > 7.1*   < > 8.2* 7.9* 7.3* 7.4* 7.2*  HCT 27.8*   < > 22.9*   < > 26.3* 25.7* 23.1* 23.5* 22.2*  MCV 86.9   < >  86.4  --   --  87.4 87.8 87.0 85.1  PLT 109*   < > 61*  --   --  41* 36* 45* 68*   < > = values in this interval not displayed.    Cardiac Enzymes: Recent Labs  Lab 10/17/19 0514  CKTOTAL 22*    BNP: Invalid input(s): POCBNP  CBG: No results for input(s): GLUCAP in the last 168 hours.  Microbiology: Results for orders placed or performed during the hospital encounter of 10/11/19  GI pathogen panel by PCR, stool     Status: None   Collection Time: 10/11/19  9:33 AM   Specimen: STOOL  Result Value Ref Range Status   Plesiomonas shigelloides NOT DETECTED NOT DETECTED Final   Yersinia enterocolitica NOT DETECTED NOT DETECTED Final   Vibrio NOT DETECTED NOT DETECTED Final   Enteropathogenic E coli NOT DETECTED NOT DETECTED Final   E coli (ETEC) LT/ST NOT DETECTED NOT DETECTED Final   E coli 1610 by PCR Not applicable NOT DETECTED Final   Cryptosporidium by PCR NOT DETECTED NOT DETECTED Final   Entamoeba histolytica NOT DETECTED NOT DETECTED Final   Adenovirus F 40/41 NOT DETECTED NOT DETECTED Final   Norovirus GI/GII NOT DETECTED NOT DETECTED Final   Sapovirus NOT DETECTED NOT DETECTED Final    Comment: (NOTE) Performed At: [facility] [place], [place] [id number] Rush Farmer MD JX:[id number]    Vibrio  cholerae NOT DETECTED NOT DETECTED Final   Campylobacter by PCR NOT DETECTED NOT DETECTED Final   Salmonella by PCR NOT DETECTED NOT DETECTED Final   E coli (STEC) NOT DETECTED NOT DETECTED Final   Enteroaggregative E coli NOT DETECTED NOT DETECTED Final   Shigella by PCR NOT DETECTED NOT DETECTED Final   Cyclospora cayetanensis NOT DETECTED NOT DETECTED Final   Astrovirus NOT DETECTED NOT DETECTED Final   G lamblia by PCR NOT DETECTED NOT DETECTED Final   Rotavirus A by PCR NOT DETECTED NOT DETECTED Final  C Difficile Quick Screen w PCR reflex     Status: None   Collection Time: 10/11/19  9:33 AM   Specimen: STOOL  Result Value Ref Range Status   C Diff antigen NEGATIVE NEGATIVE Final   C Diff toxin NEGATIVE NEGATIVE Final   C Diff interpretation No C. difficile detected.  Final    Comment: Performed at [facility], [address] [place], [place] [address]  Culture, blood (Routine X 2) w Reflex to ID Panel     Status: None   Collection Time: 10/11/19  4:34 PM   Specimen: BLOOD RIGHT HAND  Result Value Ref Range Status   Specimen Description BLOOD RIGHT HAND  Final   Special Requests   Final    BOTTLES DRAWN AEROBIC ONLY Blood Culture results may not be optimal due to an inadequate volume of blood received in culture bottles   Culture   Final    NO GROWTH 5 DAYS Performed at [facility], [address] [place], [place] [address]    Report Status 10/16/2019 FINAL  Final    Coagulation Studies: No results for input(s): LABPROT, INR in the last 72 hours.  Urinalysis: No results for input(s): COLORURINE, LABSPEC, PHURINE, GLUCOSEU, HGBUR, BILIRUBINUR, KETONESUR, PROTEINUR, UROBILINOGEN, NITRITE, LEUKOCYTESUR in the last 72 hours.  Invalid input(s): APPERANCEUR    Imaging: No results found.   Medications:   . sodium chloride 10 mL/hr at 10/17/19 0845  . ciprofloxacin Stopped (10/16/19 2347)  .  DAPTOmycin (CUBICIN)  IV Stopped (10/16/19 1625)  .  metronidazole Stopped (10/17/19 0946)  . potassium PHOSPHATE IVPB (in mmol) 30 mmol (10/17/19 1102)   . ascorbic acid  500 mg Oral BID  . atorvastatin  20 mg Oral Daily  . balsalazide  750 mg Oral TID  . Chlorhexidine Gluconate Cloth  6 each Topical Daily  . collagenase  1 application Topical Daily  . digoxin  0.0625 mg Oral Q48H  . feeding supplement (NEPRO CARB STEADY)  237 mL Oral TID BM  . finasteride  5 mg Oral Daily  . influenza vaccine adjuvanted  0.5 mL Intramuscular Tomorrow-1000  . lactobacillus  1 g Oral TID WC  . loperamide  2 mg Oral Q8H  . midodrine  10 mg Oral TID  . multivitamin with minerals  1 tablet Oral Daily  . pantoprazole  40 mg Oral Daily  . zinc sulfate  220 mg Oral Daily   sodium chloride, acetaminophen **OR** acetaminophen, ipratropium-albuterol, ondansetron (ZOFRAN) IV  Assessment/ Plan:  Colton Lamb is a 69 y.o. white male  with diabetes mellitus type II, hypertension, obstructive sleep apnea, congestive heart failure, atrial fibrillation, hyperlipidemia, Crohn's disease, coronary artery disease, GERD, pacemaker,who was admitted to [facility] on 10/11/2019 for Diarrhea of infectious origin [A09] AKI (acute kidney injury) ([place]) [N17.9]  1. Acute renal failure with metabolic acidosis: with baseline creatinine of 0.82, GFR > 60 on 10/01/19.  with underlying diabetic nephropathy with glucosuria and proteinuria.  Secondary to diarrhea and prerenal azotemia.  Renal ultrasound negative. No IV contrast exposure.  Nonoliguric urine output. No indication for dialysis.   - Encourage PO intake.   2. Hypotension: holding blood pressure medications. Placed on digoxin by cardiology for atrial fibrillation.  - midodrine - discontinued tamsulosin.   3. Anemia with renal failure and thrombocytopenia: hemoglobin 7.2 Status post PRBC transfusion on 3/15.  4. Thrombocytopenia: off heparin infusion. Stopped linezolid.  Appreciate hematology input  5. Hypokalemia:  from GI losses - PO potassium   6. Bacteremia with sepsis: pseudomonas, enterococcus and proteus on bone biopsy 09/26/19.  - Appreciate ID input. Cipro, metronidazole and daptomycin.     LOS: 6 Colton Lamb 3/18/202111:18 AM

## 2019-10-17 NOTE — NC FL2 (Signed)
[place] LEVEL OF CARE SCREENING TOOL     IDENTIFICATION  Patient Name: Colton Lamb Birthdate: Sep 29, 1950 Sex: male Admission Date (Current Location): 10/11/2019  [place] and [place] Number:  Engineering geologist and Address:  [facility], [address] [place], [place] [address]      Provider Number: [id number]  Attending Physician Name and Address:  Jennye Boroughs, MD  Relative Name and Phone Number:       Current Level of Care: Hospital Recommended Level of Care: [place] Prior Approval Number:    Date Approved/Denied:   PASRR Number: [id number]  Discharge Plan: SNF    Current Diagnoses: Patient Active Problem List   Diagnosis Date Noted  . Diarrhea 10/11/2019  . COVID-19 virus infection 10/11/2019  . Thrombocytopenia ([place]) 10/11/2019  . Sacral decubitus ulcer, stage IV ([place]) 10/11/2019  . Encounter for competency evaluation   . Acute respiratory distress syndrome (ARDS) due to COVID-19 virus ([place]) 09/29/2019  . Bacteremia due to Pseudomonas 09/29/2019  . Goals of care, counseling/discussion   . Palliative care by specialist   . HCAP (healthcare-associated pneumonia) 09/23/2019  . Colostomy in place [facility]) 09/23/2019  . Bleeding 07/11/2019  . Chronic osteomyelitis ([place]) 06/30/2019  . PICC (peripherally inserted central catheter) flush   . Normocytic anemia 06/21/2019  . Decubitus ulcer of sacral region, unstageable ([place]) 06/19/2019  . Hypotension 06/19/2019  . Anemia   . Acute respiratory failure ([place]) 06/12/2019  . Chronic systolic CHF (congestive heart failure) ([place])   . Acute blood loss anemia   . Pressure injury of skin 03/17/2019  . Acute renal failure (ARF) ([place])   . Empyema lung ([place])   . Shortness of breath   . Pleural effusion on right   . Sepsis ([place]) 03/04/2019  . Restless legs syndrome (RLS) 06/16/2017  . Hypertension, essential 06/16/2017  . CAD in native artery 04/27/2017  .  Hydrocephalus ([place]) 09/08/2016  . Dizziness and giddiness   . Elevated troponin 09/06/2016  . Type II diabetes mellitus ([place])   . Ischemic cardiomyopathy   . Hyponatremia 07/01/2015  . Diabetes mellitus type 2, uncontrolled, with complications ([place]) 63/87/5643  . 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 OSA (obstructive sleep apnea) 12/20/2013  . Morbid obesity ([place]) 12/20/2013  . AKI (acute kidney injury) - Creatinine improved at d/c 12/14/2013  . Elevated TSH - will need f/u TFTs with PCP in 3-4 weeks 12/14/2013  . Junctional bradycardia - resolved 12/14/2013  . Syncope - due to bradycardia in setting of Digoxin Toxicity 12/11/2013  . Acute on chronic HFrEF (heart failure with reduced ejection fraction) ([place]) 12/06/2013  . At risk for sudden cardiac death - on LifeVest Nov 27, 2013  . Acute on chronic combined systolic and diastolic CHF (congestive heart failure) ([place]) 11/27/13  . Cardiomyopathy, ischemic 2013/11/27  . Atrial flutter ([place]) 11/22/2013  . NSTEMI (non-ST elevated myocardial infarction) ([place]) 11/22/2013  . Long term current use of anticoagulant 10/19/2010  . Hyperlipidemia 05/06/2010  . Edema 05/06/2010  . CAD S/P percutaneous coronary angioplasty - multiple PCIs 11/11/2008  . Atrial fibrillation ([place]) 11/11/2008  . GERD 11/11/2008  . [place] INTESTINE 11/11/2008  . COLONIC POLYPS, HX OF 11/11/2008  . [place] ALLERGY 11/11/2008    Orientation RESPIRATION BLADDER Height & Weight     Self, Time, Situation, Place  Normal Indwelling catheter, Incontinent(placed 3/12) Weight: 246 lb (111.6 kg) Height:  5' 11"  (180.3 cm)  BEHAVIORAL SYMPTOMS/MOOD NEUROLOGICAL BOWEL NUTRITION  STATUS      Colostomy(placed 3/14) Diet(carb modified)  AMBULATORY STATUS COMMUNICATION OF NEEDS Skin   Extensive Assist Verbally PU Stage and Appropriate Care PU Stage 1 Dressing: (buttocks) PU Stage 2 Dressing: (back, thigh, buttocks)   PU  Stage 4 Dressing: (sacrum)               Personal Care Assistance Level of Assistance  Bathing, Feeding, Dressing Bathing Assistance: Maximum assistance Feeding assistance: Limited assistance Dressing Assistance: Maximum assistance     Functional Limitations Info  Sight, Speech, Hearing Sight Info: Adequate Hearing Info: Adequate Speech Info: Adequate    SPECIAL CARE FACTORS FREQUENCY  PT (By licensed PT), OT (By licensed OT)     PT Frequency: 5x OT Frequency: 5x            Contractures Contractures Info: Not present    Additional Factors Info  Code Status, Allergies Code Status Info: full code Allergies Info: Iodine, Shrimp (Shellfish Allergy), Tetracycline           Current Medications (10/17/2019):  This is the current hospital active medication list Current Facility-Administered Medications  Medication Dose Route Frequency Provider Last Rate Last Admin  . 0.9 %  sodium chloride infusion   Intravenous PRN Jennye Boroughs, MD 10 mL/hr at 10/17/19 0845 Restarted at 10/17/19 0845  . acetaminophen (TYLENOL) tablet 650 mg  650 mg Oral Q6H PRN Ivor Costa, MD       Or  . acetaminophen (TYLENOL) suppository 650 mg  650 mg Rectal Q6H PRN Ivor Costa, MD      . ascorbic acid (VITAMIN C) tablet 500 mg  500 mg Oral BID Ivor Costa, MD   500 mg at 10/17/19 0848  . atorvastatin (LIPITOR) tablet 20 mg  20 mg Oral Daily Leonel Ramsay, MD   20 mg at 10/17/19 0848  . balsalazide (COLAZAL) capsule 750 mg  750 mg Oral TID Ivor Costa, MD      . Chlorhexidine Gluconate Cloth 2 % PADS 6 each  6 each Topical Daily Ivor Costa, MD   6 each at 10/17/19 0848  . ciprofloxacin (CIPRO) IVPB 400 mg  400 mg Intravenous Q24H Pearla Dubonnet, RPH   Stopped at 10/16/19 2347  . collagenase (SANTYL) ointment 1 application  1 application Topical Daily Ivor Costa, MD   1 application at 52/77/82 0930  . DAPTOmycin (CUBICIN) 800 mg in sodium chloride 0.9 % IVPB  800 mg Intravenous Q48H Benita Gutter, RPH   Stopped at 10/16/19 1625  . digoxin (LANOXIN) tablet 0.0625 mg  0.0625 mg Oral Q48H Sharion Settler, NP   0.0625 mg at 10/16/19 0500  . feeding supplement (NEPRO CARB STEADY) liquid 237 mL  237 mL Oral TID BM Wyvonnia Dusky, MD 0 mL/hr at 10/16/19 0429 237 mL at 10/17/19 0850  . finasteride (PROSCAR) tablet 5 mg  5 mg Oral Daily Ivor Costa, MD   5 mg at 10/17/19 0847  . influenza vaccine adjuvanted (FLUAD) injection 0.5 mL  0.5 mL Intramuscular Tomorrow-1000 Ivor Costa, MD      . ipratropium-albuterol (DUONEB) 0.5-2.5 (3) MG/3ML nebulizer solution 3 mL  3 mL Nebulization Q4H PRN Ivor Costa, MD      . lactobacillus (FLORANEX/LACTINEX) granules 1 g  1 g Oral TID WC Ivor Costa, MD   1 g at 10/16/19 1629  . loperamide (IMODIUM) capsule 2 mg  2 mg Oral Q8H Ivor Costa, MD   2 mg at 10/17/19 0520  . metroNIDAZOLE (FLAGYL)  IVPB 500 mg  500 mg Intravenous Q8H Leonel Ramsay, MD 100 mL/hr at 10/17/19 0855 Rate Verify at 10/17/19 0855  . midodrine (PROAMATINE) tablet 10 mg  10 mg Oral TID Kate Sable, MD   10 mg at 10/17/19 0847  . multivitamin with minerals tablet 1 tablet  1 tablet Oral Daily Benita Gutter, RPH   1 tablet at 10/17/19 0847  . ondansetron (ZOFRAN) injection 4 mg  4 mg Intravenous Q8H PRN Ivor Costa, MD   4 mg at 10/13/19 1159  . pantoprazole (PROTONIX) EC tablet 40 mg  40 mg Oral Daily Ivor Costa, MD   40 mg at 10/17/19 0848  . potassium PHOSPHATE 30 mmol in dextrose 5 % 500 mL infusion  30 mmol Intravenous Once Jennye Boroughs, MD      . zinc sulfate capsule 220 mg  220 mg Oral Daily Benita Gutter, RPH   220 mg at 10/17/19 5053     Discharge Medications: Please see discharge summary for a list of discharge medications.  Relevant Imaging Results:  Relevant Lab Results:   Additional Information ZJQ:[id number]  Eileen Stanford, LCSW

## 2019-10-17 NOTE — Progress Notes (Signed)
Progress Note    Colton Lamb  BFX:[id number] DOB: 1951-02-27  DOA: 10/11/2019 PCP: [name], MD      Brief Narrative:    Medical records reviewed and are as summarized below:  Colton Lamb is an 69 y.o. male       Assessment/Plan:   Principal Problem:   Acute renal failure (ARF) ([place]) Active Problems:   Hyperlipidemia   CAD S/P percutaneous coronary angioplasty - multiple PCIs   Atrial fibrillation ([place])   GERD   CROHN'S DISEASE-LARGE & SMALL INTESTINE   Hypertension, essential   Chronic systolic CHF (congestive heart failure) (HCC)   Normocytic anemia   Chronic osteomyelitis ([place])   Colostomy in place [facility])   Bacteremia due to Pseudomonas   Diarrhea   COVID-19 virus infection   Thrombocytopenia ([place])   Sacral decubitus ulcer, stage IV ([place])   Acute renal failure:Cr continues to trend down. Baseline around 0.82. Likely due to prerenal secondary to dehydration.  Avoid nephrotoxics meds, hypotension.  Follow-up with nephrologist.  Monitor BMP  Diarrhea: Resolved.  C. diff PCR negative. GI path panel completely neg.   Hypokalemia and hypomagnesemia: Replete potassium and magnesium respectively.  Monitor levels.  Hyperlipidemia: continue on statin   CAD: s/p multiple PCIs. Continue w/ aspirin and lipitor  Atrial fibrillation w/ RVR. PAF vs chronic.  Continue digoxin.  Anticoagulation on hold because of worsening thrombocytopenia.    Hypotension: Asymptomatic.  Systolic BP has mostly been in the 90s.  This makes use of rate controlling drugs difficult.  Monitor BP closely.  GERD: continue on PPI   Crohns Disease: continue balsalazide. S/p colostomy  Chronic systolic MAY:[id number].  Echo on 09/28/2019 showed EF 25-30%.  Normocytic anemia & acute blood loss anemia: possibly secondary to IV heparin use. S/p 1 unit PRBCs 10/13/19.   H&H is stable  Chronic osteomyelitis & Pseudomonas bacteremia: Continue current antibiotics.   Follow-up with ID.  Completed linezolid course 10/14/19. Repeat blood cx NGTD  COVID-19 virus infection: asymptomatic  Thrombocytopenia: Platelet count is starting to improve.  No need for a transfusion at this time.  Recommendations from hematologist noted.  Sacral decubitus ulcer, stage IV: present on admission. continue local wound care and wound VAC therapy.    Generalized weakness: PT and OT recommend SNF at discharge  Failure to thrive: lives at home alone. Cannot take care of himself at home and pt agrees with this.  Patient was not interested in palliative care.   Body mass index is 34.31 kg/m.  (Obesity)   Family Communication/Anticipated D/C date and plan/Code Status   DVT prophylaxis: SCDs.  Xarelto on hold because of thrombocytopenia Code Status: Full code Family Communication: Discussed with patient.  Sister-in-law at the bedside. Disposition Plan: Plan to discharge to SNF when cleared by infectious disease specialist, cardiologist and hematologist      Subjective:   He has no complaints.  He feels okay.  No chest pain, shortness of breath, palpitations or dizziness  Objective:    Vitals:   10/16/19 1940 10/17/19 0515 10/17/19 0742 10/17/19 1129  BP: 106/66 105/65 116/66 104/78  Pulse: (!) 101 100 98 72  Resp: 20 20  18   Temp: 97.9 F (36.6 C) 98 F (36.7 C)  98.1 F (36.7 C)  TempSrc: Oral Oral  Oral  SpO2: 99% 100% 100% 100%  Weight:      Height:        Intake/Output Summary (Last 24 hours) at 10/17/2019 1347 Last data filed  at 10/17/2019 1042 Gross per 24 hour  Intake 749.96 ml  Output 2950 ml  Net -2200.04 ml   Filed Weights   10/14/19 0412 10/15/19 0351 10/16/19 1300  Weight: 88.5 kg 80.3 kg 111.6 kg    Exam:  GEN: NAD SKIN: Stage IV sacral decubitus ulcer connected to wound VAC EYES: No pallor or icterus ENT: MMM CV: Irregular rate and rhythm, tachycardic PULM: CTA B ABD: soft, ND, NT, +BS CNS: AAO x 3, non focal EXT: No edema  or tenderness GU: Foley catheter draining amber urine   Data Reviewed:   I have personally reviewed following labs and imaging studies:  Labs: Labs show the following:   Basic Metabolic Panel: Recent Labs  Lab 10/12/19 0821 10/12/19 1216 10/14/19 0005 10/14/19 0005 10/14/19 0552 10/14/19 0552 10/15/19 0433 10/15/19 0433 10/16/19 0618 10/17/19 0514  NA 142   < > 143  --  143  --  144  --  144 143  K 3.6   < > 3.6   < > 3.9   < > 3.5   < > 3.0* 3.4*  CL 107   < > 103  --  103  --  101  --  100 102  CO2 18*   < > 25  --  26  --  33*  --  34* 33*  GLUCOSE 90   < > 122*  --  111*  --  113*  --  100* 109*  BUN 69*   < > 62*  --  59*  --  55*  --  55* 45*  CREATININE 4.42*   < > 3.95*  --  3.68*  --  3.36*  --  2.92* 2.54*  CALCIUM 7.7*   < > 7.5*  --  7.6*  --  7.4*  --  7.4* 7.7*  MG 1.8  --   --   --   --   --   --   --  1.3* 1.5*  PHOS  --   --   --   --   --   --   --   --   --  1.2*   < > = values in this interval not displayed.   GFR Estimated Creatinine Clearance: 35.4 mL/min (A) (by C-G formula based on SCr of 2.54 mg/dL (H)). Liver Function Tests: Recent Labs  Lab 10/11/19 0933  AST 19  ALT 15  ALKPHOS 88  BILITOT 0.9  PROT 6.2*  ALBUMIN 2.1*   No results for input(s): LIPASE, AMYLASE in the last 168 hours. No results for input(s): AMMONIA in the last 168 hours. Coagulation profile Recent Labs  Lab 10/11/19 2107  INR 1.4*    CBC: Recent Labs  Lab 10/11/19 0933 10/12/19 0821 10/13/19 0702 10/13/19 0702 10/13/19 1923 10/14/19 0552 10/15/19 0433 10/16/19 0618 10/17/19 0514  WBC 12.0*   < > 9.9  --   --  9.7 10.6* 10.7* 10.1  NEUTROABS 9.9*  --   --   --   --   --   --   --  7.3  HGB 8.5*   < > 7.1*   < > 8.2* 7.9* 7.3* 7.4* 7.2*  HCT 27.8*   < > 22.9*   < > 26.3* 25.7* 23.1* 23.5* 22.2*  MCV 86.9   < > 86.4  --   --  87.4 87.8 87.0 85.1  PLT 109*   < > 61*  --   --  41* 36*  45* 68*   < > = values in this interval not displayed.   Cardiac  Enzymes: Recent Labs  Lab 10/17/19 0514  CKTOTAL 22*   BNP (last 3 results) No results for input(s): PROBNP in the last 8760 hours. CBG: No results for input(s): GLUCAP in the last 168 hours. D-Dimer: No results for input(s): DDIMER in the last 72 hours. Hgb A1c: No results for input(s): HGBA1C in the last 72 hours. Lipid Profile: No results for input(s): CHOL, HDL, LDLCALC, TRIG, CHOLHDL, LDLDIRECT in the last 72 hours. Thyroid function studies: No results for input(s): TSH, T4TOTAL, T3FREE, THYROIDAB in the last 72 hours.  Invalid input(s): FREET3 Anemia work up: Recent Labs    10/17/19 0514  FOLATE 5.0*  FERRITIN 255  TIBC 150*  IRON 18*   Sepsis Labs: Recent Labs  Lab 10/11/19 2315 10/12/19 0208 10/12/19 0821 10/14/19 0552 10/15/19 0433 10/16/19 0618 10/17/19 0514  WBC  --   --    < > 9.7 10.6* 10.7* 10.1  LATICACIDVEN 1.5 1.6  --   --   --   --   --    < > = values in this interval not displayed.    Microbiology Recent Results (from the past 240 hour(s))  GI pathogen panel by PCR, stool     Status: None   Collection Time: 10/11/19  9:33 AM   Specimen: STOOL  Result Value Ref Range Status   Plesiomonas shigelloides NOT DETECTED NOT DETECTED Final   Yersinia enterocolitica NOT DETECTED NOT DETECTED Final   Vibrio NOT DETECTED NOT DETECTED Final   Enteropathogenic E coli NOT DETECTED NOT DETECTED Final   E coli (ETEC) LT/ST NOT DETECTED NOT DETECTED Final   E coli 8250 by PCR Not applicable NOT DETECTED Final   Cryptosporidium by PCR NOT DETECTED NOT DETECTED Final   Entamoeba histolytica NOT DETECTED NOT DETECTED Final   Adenovirus F 40/41 NOT DETECTED NOT DETECTED Final   Norovirus GI/GII NOT DETECTED NOT DETECTED Final   Sapovirus NOT DETECTED NOT DETECTED Final    Comment: (NOTE) Performed At: [facility] [place], [place] [id number] Rush Farmer MD PF:[id number]    Vibrio cholerae NOT DETECTED NOT DETECTED Final    Campylobacter by PCR NOT DETECTED NOT DETECTED Final   Salmonella by PCR NOT DETECTED NOT DETECTED Final   E coli (STEC) NOT DETECTED NOT DETECTED Final   Enteroaggregative E coli NOT DETECTED NOT DETECTED Final   Shigella by PCR NOT DETECTED NOT DETECTED Final   Cyclospora cayetanensis NOT DETECTED NOT DETECTED Final   Astrovirus NOT DETECTED NOT DETECTED Final   G lamblia by PCR NOT DETECTED NOT DETECTED Final   Rotavirus A by PCR NOT DETECTED NOT DETECTED Final  C Difficile Quick Screen w PCR reflex     Status: None   Collection Time: 10/11/19  9:33 AM   Specimen: STOOL  Result Value Ref Range Status   C Diff antigen NEGATIVE NEGATIVE Final   C Diff toxin NEGATIVE NEGATIVE Final   C Diff interpretation No C. difficile detected.  Final    Comment: Performed at [facility], [address] [place], [place] [address]  Culture, blood (Routine X 2) w Reflex to ID Panel     Status: None   Collection Time: 10/11/19  4:34 PM   Specimen: BLOOD RIGHT HAND  Result Value Ref Range Status   Specimen Description BLOOD RIGHT HAND  Final   Special Requests   Final  BOTTLES DRAWN AEROBIC ONLY Blood Culture results may not be optimal due to an inadequate volume of blood received in culture bottles   Culture   Final    NO GROWTH 5 DAYS Performed at [facility], [address], [place], [place] [address]    Report Status 10/16/2019 FINAL  Final    Procedures and diagnostic studies:  No results found.  Medications:   . ascorbic acid  500 mg Oral BID  . atorvastatin  20 mg Oral Daily  . balsalazide  750 mg Oral TID  . Chlorhexidine Gluconate Cloth  6 each Topical Daily  . collagenase  1 application Topical Daily  . digoxin  0.0625 mg Oral Q48H  . feeding supplement (NEPRO CARB STEADY)  237 mL Oral TID BM  . finasteride  5 mg Oral Daily  . influenza vaccine adjuvanted  0.5 mL Intramuscular Tomorrow-1000  . lactobacillus  1 g Oral TID WC  . loperamide  2 mg Oral Q8H    . midodrine  10 mg Oral TID  . multivitamin with minerals  1 tablet Oral Daily  . pantoprazole  40 mg Oral Daily  . zinc sulfate  220 mg Oral Daily   Continuous Infusions: . sodium chloride 10 mL/hr at 10/17/19 0845  . ciprofloxacin    . DAPTOmycin (CUBICIN)  IV    . magnesium sulfate bolus IVPB    . metronidazole Stopped (10/17/19 0946)  . potassium PHOSPHATE IVPB (in mmol) 30 mmol (10/17/19 1102)     LOS: 6 days   Kavitha Lansdale  [facility]     10/17/2019, 1:47 PM

## 2019-10-18 LAB — CBC WITH DIFFERENTIAL/PLATELET
Abs Immature Granulocytes: 0.17 10*3/uL — ABNORMAL HIGH (ref 0.00–0.07)
Basophils Absolute: 0.1 10*3/uL (ref 0.0–0.1)
Basophils Relative: 1 %
Eosinophils Absolute: 0.4 10*3/uL (ref 0.0–0.5)
Eosinophils Relative: 4 %
HCT: 21.6 % — ABNORMAL LOW (ref 39.0–52.0)
Hemoglobin: 6.7 g/dL — ABNORMAL LOW (ref 13.0–17.0)
Immature Granulocytes: 2 %
Lymphocytes Relative: 16 %
Lymphs Abs: 1.8 10*3/uL (ref 0.7–4.0)
MCH: 26.8 pg (ref 26.0–34.0)
MCHC: 31 g/dL (ref 30.0–36.0)
MCV: 86.4 fL (ref 80.0–100.0)
Monocytes Absolute: 1.2 10*3/uL — ABNORMAL HIGH (ref 0.1–1.0)
Monocytes Relative: 10 %
Neutro Abs: 7.7 10*3/uL (ref 1.7–7.7)
Neutrophils Relative %: 67 %
Platelets: 120 10*3/uL — ABNORMAL LOW (ref 150–400)
RBC: 2.5 MIL/uL — ABNORMAL LOW (ref 4.22–5.81)
RDW: 19.7 % — ABNORMAL HIGH (ref 11.5–15.5)
WBC: 11.4 10*3/uL — ABNORMAL HIGH (ref 4.0–10.5)
nRBC: 0 % (ref 0.0–0.2)

## 2019-10-18 LAB — BASIC METABOLIC PANEL
Anion gap: 12 (ref 5–15)
BUN: 40 mg/dL — ABNORMAL HIGH (ref 8–23)
CO2: 29 mmol/L (ref 22–32)
Calcium: 7.6 mg/dL — ABNORMAL LOW (ref 8.9–10.3)
Chloride: 101 mmol/L (ref 98–111)
Creatinine, Ser: 2.19 mg/dL — ABNORMAL HIGH (ref 0.61–1.24)
GFR calc Af Amer: 35 mL/min — ABNORMAL LOW (ref >60)
GFR calc non Af Amer: 30 mL/min — ABNORMAL LOW (ref >60)
Glucose, Bld: 112 mg/dL — ABNORMAL HIGH (ref 70–99)
Potassium: 3.3 mmol/L — ABNORMAL LOW (ref 3.5–5.1)
Sodium: 142 mmol/L (ref 135–145)

## 2019-10-18 LAB — PHOSPHORUS: Phosphorus: 1.6 mg/dL — ABNORMAL LOW (ref 2.5–4.6)

## 2019-10-18 LAB — PREPARE RBC (CROSSMATCH)

## 2019-10-18 LAB — MAGNESIUM: Magnesium: 1.6 mg/dL — ABNORMAL LOW (ref 1.7–2.4)

## 2019-10-18 MED ORDER — POTASSIUM PHOSPHATES 15 MMOLE/5ML IV SOLN
30.0000 mmol | Freq: Once | INTRAVENOUS | Status: AC
  Administered 2019-10-18: 30 mmol via INTRAVENOUS
  Filled 2019-10-18: qty 10

## 2019-10-18 MED ORDER — FUROSEMIDE 10 MG/ML IJ SOLN: 20.0000 mg | Freq: Once | INTRAMUSCULAR | Status: DC

## 2019-10-18 MED ORDER — SODIUM CHLORIDE 0.9% IV SOLUTION: Freq: Once | INTRAVENOUS | Status: AC

## 2019-10-18 MED ORDER — FUROSEMIDE 10 MG/ML IJ SOLN
INTRAMUSCULAR | Status: AC
  Filled 2019-10-18: qty 2

## 2019-10-18 MED ORDER — POTASSIUM CHLORIDE CRYS ER 20 MEQ PO TBCR
40.0000 meq | EXTENDED_RELEASE_TABLET | Freq: Once | ORAL | Status: AC
  Administered 2019-10-18: 40 meq via ORAL
  Filled 2019-10-18: qty 2

## 2019-10-18 MED ORDER — MAGNESIUM SULFATE 2 GM/50ML IV SOLN
2.0000 g | Freq: Once | INTRAVENOUS | Status: AC
  Administered 2019-10-18: 2 g via INTRAVENOUS
  Filled 2019-10-18: qty 50

## 2019-10-18 NOTE — Progress Notes (Signed)
[name] INFECTIOUS DISEASE PROGRESS NOTE Date of Admission:  10/11/2019     ID: Colton Lamb is a 69 y.o. male with Pseudomonal bacteremia and chronic sacral ulcer Principal Problem:   Acute renal failure (ARF) ([place]) Active Problems:   Hyperlipidemia   CAD S/P percutaneous coronary angioplasty - multiple PCIs   Atrial fibrillation (HCC)   GERD   CROHN'S DISEASE-LARGE & SMALL INTESTINE   Hypertension, essential   Chronic systolic CHF (congestive heart failure) (HCC)   Normocytic anemia   Chronic osteomyelitis ([place])   Colostomy in place [name]   Bacteremia due to Pseudomonas   Diarrhea   COVID-19 virus infection   Thrombocytopenia (HCC)   Sacral decubitus ulcer, stage IV ([place])   Subjective: 3/19 - no fevers. Diarrhea better. [place] neg.  Getting blood transfusion 3/18 - no fevers, wound stable per RN. Denies diarrhea  Patient previously followed while hospitalized.  Now readmitted with acute renal failure and diarrhea.  He had been discharged on linezolid and Zosyn.  On admission he had slightly elevated white count but new thrombocytopenia.  The thrombocytopenia has worsened.  Yesterday when I was called regarding this we advised stop the linezolid and Zosyn as possible causes of the thrombocytopenia.  I started him on ciprofloxacin and Flagyl.  He is seen this morning.  He reports that his diarrhea is resolved.  His tells me that his wound is improving.  He says he was readmitted because he could not be cared for at home but is not sure of the ongoing plan.  ROS  Eleven systems are reviewed and negative except per hpi  Medications:  Antibiotics Given (last 72 hours)    Date/Time Action Medication Dose Rate   10/15/19 1445 New Bag/Given   piperacillin-tazobactam (ZOSYN) IVPB 3.375 g 3.375 g 12.5 mL/hr   10/15/19 2258 New Bag/Given   metroNIDAZOLE (FLAGYL) IVPB 500 mg 500 mg 100 mL/hr   10/15/19 2258 New Bag/Given   ciprofloxacin (CIPRO) IVPB 400 mg 400 mg 200 mL/hr   10/16/19 0502 New Bag/Given   metroNIDAZOLE (FLAGYL) IVPB 500 mg 500 mg 100 mL/hr   10/16/19 1404 New Bag/Given   metroNIDAZOLE (FLAGYL) IVPB 500 mg 500 mg 100 mL/hr   10/16/19 1539 New Bag/Given   DAPTOmycin (CUBICIN) 800 mg in sodium chloride 0.9 % IVPB 800 mg 232 mL/hr   10/16/19 2145 New Bag/Given   ciprofloxacin (CIPRO) IVPB 400 mg 400 mg 200 mL/hr   10/16/19 2252 New Bag/Given   metroNIDAZOLE (FLAGYL) IVPB 500 mg 500 mg 100 mL/hr   10/17/19 0846 New Bag/Given   metroNIDAZOLE (FLAGYL) IVPB 500 mg 500 mg 100 mL/hr   10/17/19 1512 New Bag/Given   metroNIDAZOLE (FLAGYL) IVPB 500 mg 500 mg 100 mL/hr   10/17/19 1832 New Bag/Given   DAPTOmycin (CUBICIN) 800 mg in sodium chloride 0.9 % IVPB 800 mg 232 mL/hr   10/17/19 2041 New Bag/Given   ciprofloxacin (CIPRO) IVPB 400 mg 400 mg 200 mL/hr   10/17/19 2205 New Bag/Given   metroNIDAZOLE (FLAGYL) IVPB 500 mg 500 mg 100 mL/hr   10/18/19 7517 New Bag/Given   metroNIDAZOLE (FLAGYL) IVPB 500 mg 500 mg 100 mL/hr   10/18/19 1111 New Bag/Given   ciprofloxacin (CIPRO) IVPB 400 mg 400 mg 200 mL/hr     . sodium chloride   Intravenous Once  . ascorbic acid  500 mg Oral BID  . atorvastatin  20 mg Oral Daily  . balsalazide  750 mg Oral TID  . Chlorhexidine Gluconate Cloth  6 each Topical Daily  . collagenase  1 application Topical Daily  . digoxin  0.0625 mg Oral Q48H  . feeding supplement (NEPRO CARB STEADY)  237 mL Oral TID BM  . finasteride  5 mg Oral Daily  . influenza vaccine adjuvanted  0.5 mL Intramuscular Tomorrow-1000  . lactobacillus  1 g Oral TID WC  . loperamide  2 mg Oral Q8H  . midodrine  10 mg Oral TID  . multivitamin with minerals  1 tablet Oral Daily  . pantoprazole  40 mg Oral Daily  . zinc sulfate  220 mg Oral Daily    Objective: Vital signs in last 24 hours: Temp:  [98.2 F (36.8 C)-98.5 F (36.9 C)] 98.2 F (36.8 C) (03/19 1328) Pulse Rate:  [86-95] 87 (03/19 1328) Resp:  [17-20] 17 (03/19 1328) BP:  (83-117)/(56-74) 107/74 (03/19 1328) SpO2:  [98 %-100 %] 99 % (03/19 1138) Physical Exam  Constitutional: chronically ill appearing HENT: anicteric Mouth/Throat: Oropharynx is clear and moist. No oropharyngeal exudate.  Cardiovascular: Normal rate, regular rhythm and normal heart sounds. Pulmonary/Chest: Effort normal and breath sounds normal. No respiratory distress. He has no wheezes.  Abdominal: Soft. Bowel sounds are normal. He exhibits no distension. There is no tenderness. Colostomy in place Lymphadenopathy:  He has no cervical adenopathy.  Neurological: He is alert and oriented to person, place, and time.  Integumentary: per wound note 3/14 -  Wound type: Stage 4 Pressure Injury Pressure Injury POA: Yes Measurement: 6cm x 9.5cm x 3.0cm Wound bed:100% clean, minimal fibrinous material  Drainage (amount, consistency, odor) moderate, yellow, no odor Periwound: intact, severe epibole of the wound edges  Dressing procedure/placement/frequency: Chronic non healing pressure injury: saline moist dressings BID, cover with ABD pad, secure with tape.   Lab Results Recent Labs    10/17/19 0514 10/18/19 0423  WBC 10.1 11.4*  HGB 7.2* 6.7*  HCT 22.2* 21.6*  NA 143 142  K 3.4* 3.3*  CL 102 101  CO2 33* 29  BUN 45* 40*  CREATININE 2.54* 2.19*    Microbiology: Results for orders placed or performed during the hospital encounter of 10/11/19  GI pathogen panel by PCR, stool     Status: None   Collection Time: 10/11/19  9:33 AM   Specimen: STOOL  Result Value Ref Range Status   Plesiomonas shigelloides NOT DETECTED NOT DETECTED Final   Yersinia enterocolitica NOT DETECTED NOT DETECTED Final   Vibrio NOT DETECTED NOT DETECTED Final   Enteropathogenic E coli NOT DETECTED NOT DETECTED Final   E coli (ETEC) LT/ST NOT DETECTED NOT DETECTED Final   E coli 1829 by PCR Not applicable NOT DETECTED Final   Cryptosporidium by PCR NOT DETECTED NOT DETECTED Final   Entamoeba histolytica NOT  DETECTED NOT DETECTED Final   Adenovirus F 40/41 NOT DETECTED NOT DETECTED Final   Norovirus GI/GII NOT DETECTED NOT DETECTED Final   Sapovirus NOT DETECTED NOT DETECTED Final    Comment: (NOTE) Performed At: [facility] [address] [place], [place] [id number] Rush Farmer MD LF:[id number]    Vibrio cholerae NOT DETECTED NOT DETECTED Final   Campylobacter by PCR NOT DETECTED NOT DETECTED Final   Salmonella by PCR NOT DETECTED NOT DETECTED Final   E coli (STEC) NOT DETECTED NOT DETECTED Final   Enteroaggregative E coli NOT DETECTED NOT DETECTED Final   Shigella by PCR NOT DETECTED NOT DETECTED Final   Cyclospora cayetanensis NOT DETECTED NOT DETECTED Final   Astrovirus NOT DETECTED NOT DETECTED Final  G lamblia by PCR NOT DETECTED NOT DETECTED Final   Rotavirus A by PCR NOT DETECTED NOT DETECTED Final  C Difficile Quick Screen w PCR reflex     Status: None   Collection Time: 10/11/19  9:33 AM   Specimen: STOOL  Result Value Ref Range Status   C Diff antigen NEGATIVE NEGATIVE Final   C Diff toxin NEGATIVE NEGATIVE Final   C Diff interpretation No C. difficile detected.  Final    Comment: Performed at [facility], [address] [place], [place] [address]  Culture, blood (Routine X 2) w Reflex to ID Panel     Status: None   Collection Time: 10/11/19  4:34 PM   Specimen: BLOOD RIGHT HAND  Result Value Ref Range Status   Specimen Description BLOOD RIGHT HAND  Final   Special Requests   Final    BOTTLES DRAWN AEROBIC ONLY Blood Culture results may not be optimal due to an inadequate volume of blood received in culture bottles   Culture   Final    NO GROWTH 5 DAYS Performed at [facility], [address], [place], [place] [address]    Report Status 10/16/2019 FINAL  Final    Studies/Results: Left ventricular ejection fraction, by estimation, is 25 to 30%. The left ventricle has severely decreased function. The left ventricle demonstrates  regional wall motion abnormalities , anterior , anterospetal and apical wall hypokinesis. 2. Right ventricular systolic function is normal. The right ventricular size is normal. There is mildly elevated pulmonary artery systolic pressure. 3. Left atrial size was mildly dilated. 4. No vegetation noted. IMPRESSION: Large presacral decubitus ulcer with progressive bone destruction of the distal sacrum and coccyx consistent with osteomyelitis. Chronic nonspecific sclerosis within S1 segment of sacrum. Prostatic enlargement with mild bladder wall thickening which could represent muscular hypertrophy from chronic bladder outlet obstruction or from chronic cystitis. Nonobstructing 4 mm calculus distal LEFT ureter. Double-barrel colostomy LEFT lower quadrant.  Assessment/Plan: 69 year old gentleman is largely bedbound,Has hypertension, hyperlipidemia, diabetes, GERD, depression, proximal A. fib, obstructive sleep apnea, Crohn's disease status post colostomy, indwelling Foley catheter, chronic systolic CHF.  He was recently admitted for a prolonged hospital course with osteomyelitis and sacral wound.  He also had pseudomonal bacteremia.  Cultures from his wound were mixed.  He was discharged on Zosyn and linezolid.  He was readmitted after failure to thrive at home with acute renal failure.  He is also developed thrombocytopenia.  He had completed a 2-week course of linezolid for VRE.  His wound per wound care is clean with minimal evidence of necrosis. Yesterday we stopped his Zosyn.  Is been on linezolid for several days now however his platelets continue to drop.  Pseudomonas bacteremia- source is his long term decub ulcer which also grew Pseudomonas, VRE and proteus and anaerobes from bone biopsy. He has AICD and hence risk  for endocarditis , lead vegetation but TTE is negative. Per cards he is not a great TEE candidate.  Given the diarrhea and the thrombocytopenia I have changed his Zosyn to Cipro  and Flagyl and will continue this for now..  Stage IV sacral decubitus with chronic osteomyelitis - he refuses to let me see the wound today.  at this point will continue the Cipro Flagyl as above.  We will also cont daptomycin while he is inpatient. Continue with aggressive wound care.[place] Kitchen  TCP -likely induced by linezolid- increasing to 120 - cont to follow   Stop date for the cipro flagyl  and dapto is March 22.  If dced prior to this date can send on just cipro and flaygl as dapto unlikely to be covered. Will need aggressive wound care.  DC picc prior to discharge   Thank you very much for the consult. Will follow with you.  Leonel Ramsay    10/18/2019, 2:26 PM

## 2019-10-18 NOTE — Progress Notes (Signed)
[facility]  ROUNDING NOTE   Subjective:   No complaints.  States diarrhea has resolved.   Creatinine 2.19 (2.54)  Objective:  Vital signs in last 24 hours:  Temp:  [98.2 F (36.8 C)-98.5 F (36.9 C)] 98.2 F (36.8 C) (03/19 1328) Pulse Rate:  [86-95] 87 (03/19 1328) Resp:  [17-20] 17 (03/19 1328) BP: (83-117)/(56-74) 107/74 (03/19 1328) SpO2:  [98 %-100 %] 99 % (03/19 1138)  Weight change:  Filed Weights   10/14/19 0412 10/15/19 0351 10/16/19 1300  Weight: 88.5 kg 80.3 kg 111.6 kg    Intake/Output: I/O last 3 completed shifts: In: 440.7 [I.V.:30.6; IV Piggyback:410] Out: 4400 [Urine:4150; Stool:250]   Intake/Output this shift:  Total I/O In: 607 [P.O.:237; Blood:320; IV Piggyback:50] Out: 700 [Urine:650; Stool:50]  Physical Exam: General: NAD, laying in bed  Head: Normocephalic, atraumatic. Moist oral mucosal membranes  Eyes: Anicteric, PERRL  Neck: Supple, trachea midline  Lungs:  Clear to auscultation  Heart: Regular rate and rhythm  Abdomen:  Soft, nontender,   Extremities:  no peripheral edema.  Neurologic: Nonfocal, moving all four extremities   Skin Sacral decub, covered and clean       Basic Metabolic Panel: Recent Labs  Lab 10/12/19 0821 10/12/19 1216 10/14/19 0552 10/14/19 0552 10/15/19 0433 10/15/19 0433 10/16/19 0618 10/17/19 0514 10/18/19 0423  NA 142   < > 143  --  144  --  144 143 142  K 3.6   < > 3.9  --  3.5  --  3.0* 3.4* 3.3*  CL 107   < > 103  --  101  --  100 102 101  CO2 18*   < > 26  --  33*  --  34* 33* 29  GLUCOSE 90   < > 111*  --  113*  --  100* 109* 112*  BUN 69*   < > 59*  --  55*  --  55* 45* 40*  CREATININE 4.42*   < > 3.68*  --  3.36*  --  2.92* 2.54* 2.19*  CALCIUM 7.7*   < > 7.6*   < > 7.4*   < > 7.4* 7.7* 7.6*  MG 1.8  --   --   --   --   --  1.3* 1.5* 1.6*  PHOS  --   --   --   --   --   --   --  1.2* 1.6*   < > = values in this interval not displayed.    Liver Function Tests: No results for  input(s): AST, ALT, ALKPHOS, BILITOT, PROT, ALBUMIN in the last 168 hours. No results for input(s): LIPASE, AMYLASE in the last 168 hours. No results for input(s): AMMONIA in the last 168 hours.  CBC: Recent Labs  Lab 10/14/19 0552 10/15/19 0433 10/16/19 0618 10/17/19 0514 10/18/19 0423  WBC 9.7 10.6* 10.7* 10.1 11.4*  NEUTROABS  --   --   --  7.3 7.7  HGB 7.9* 7.3* 7.4* 7.2* 6.7*  HCT 25.7* 23.1* 23.5* 22.2* 21.6*  MCV 87.4 87.8 87.0 85.1 86.4  PLT 41* 36* 45* 68* 120*    Cardiac Enzymes: Recent Labs  Lab 10/17/19 0514  CKTOTAL 22*    BNP: Invalid input(s): POCBNP  CBG: No results for input(s): GLUCAP in the last 168 hours.  Microbiology: Results for orders placed or performed during the hospital encounter of 10/11/19  GI pathogen panel by PCR, stool     Status: None  Collection Time: 10/11/19  9:33 AM   Specimen: STOOL  Result Value Ref Range Status   Plesiomonas shigelloides NOT DETECTED NOT DETECTED Final   Yersinia enterocolitica NOT DETECTED NOT DETECTED Final   Vibrio NOT DETECTED NOT DETECTED Final   Enteropathogenic E coli NOT DETECTED NOT DETECTED Final   E coli (ETEC) LT/ST NOT DETECTED NOT DETECTED Final   E coli 5859 by PCR Not applicable NOT DETECTED Final   Cryptosporidium by PCR NOT DETECTED NOT DETECTED Final   Entamoeba histolytica NOT DETECTED NOT DETECTED Final   Adenovirus F 40/41 NOT DETECTED NOT DETECTED Final   Norovirus GI/GII NOT DETECTED NOT DETECTED Final   Sapovirus NOT DETECTED NOT DETECTED Final    Comment: (NOTE) Performed At: [facility] [place], [place] [id number] Rush Farmer MD NO:[id number]    Vibrio cholerae NOT DETECTED NOT DETECTED Final   Campylobacter by PCR NOT DETECTED NOT DETECTED Final   Salmonella by PCR NOT DETECTED NOT DETECTED Final   E coli (STEC) NOT DETECTED NOT DETECTED Final   Enteroaggregative E coli NOT DETECTED NOT DETECTED Final   Shigella by PCR NOT DETECTED NOT DETECTED  Final   Cyclospora cayetanensis NOT DETECTED NOT DETECTED Final   Astrovirus NOT DETECTED NOT DETECTED Final   G lamblia by PCR NOT DETECTED NOT DETECTED Final   Rotavirus A by PCR NOT DETECTED NOT DETECTED Final  C Difficile Quick Screen w PCR reflex     Status: None   Collection Time: 10/11/19  9:33 AM   Specimen: STOOL  Result Value Ref Range Status   C Diff antigen NEGATIVE NEGATIVE Final   C Diff toxin NEGATIVE NEGATIVE Final   C Diff interpretation No C. difficile detected.  Final    Comment: Performed at [facility], [address] [place], [place] [address]  Culture, blood (Routine X 2) w Reflex to ID Panel     Status: None   Collection Time: 10/11/19  4:34 PM   Specimen: BLOOD RIGHT HAND  Result Value Ref Range Status   Specimen Description BLOOD RIGHT HAND  Final   Special Requests   Final    BOTTLES DRAWN AEROBIC ONLY Blood Culture results may not be optimal due to an inadequate volume of blood received in culture bottles   Culture   Final    NO GROWTH 5 DAYS Performed at [facility], [address], [place], [place] [address]    Report Status 10/16/2019 FINAL  Final    Coagulation Studies: No results for input(s): LABPROT, INR in the last 72 hours.  Urinalysis: No results for input(s): COLORURINE, LABSPEC, PHURINE, GLUCOSEU, HGBUR, BILIRUBINUR, KETONESUR, PROTEINUR, UROBILINOGEN, NITRITE, LEUKOCYTESUR in the last 72 hours.  Invalid input(s): APPERANCEUR    Imaging: No results found.   Medications:   . sodium chloride 10 mL/hr at 10/17/19 0845  . ciprofloxacin 400 mg (10/18/19 1111)  . DAPTOmycin (CUBICIN)  IV Stopped (10/17/19 1954)  . metronidazole 500 mg (10/18/19 0609)  . potassium PHOSPHATE IVPB (in mmol) 30 mmol (10/18/19 1216)   . sodium chloride   Intravenous Once  . ascorbic acid  500 mg Oral BID  . atorvastatin  20 mg Oral Daily  . balsalazide  750 mg Oral TID  . Chlorhexidine Gluconate Cloth  6 each Topical Daily  .  collagenase  1 application Topical Daily  . digoxin  0.0625 mg Oral Q48H  . feeding supplement (NEPRO CARB STEADY)  237 mL Oral TID BM  .  finasteride  5 mg Oral Daily  . influenza vaccine adjuvanted  0.5 mL Intramuscular Tomorrow-1000  . lactobacillus  1 g Oral TID WC  . loperamide  2 mg Oral Q8H  . midodrine  10 mg Oral TID  . multivitamin with minerals  1 tablet Oral Daily  . pantoprazole  40 mg Oral Daily  . zinc sulfate  220 mg Oral Daily   sodium chloride, acetaminophen **OR** acetaminophen, ipratropium-albuterol, ondansetron (ZOFRAN) IV  Assessment/ Plan:  [name] is a 69 y.o. white male  with diabetes mellitus type II, hypertension, obstructive sleep apnea, congestive heart failure, atrial fibrillation, hyperlipidemia, Crohn's disease, coronary artery disease, GERD, pacemaker,who was admitted to [facility] on 10/11/2019 for Diarrhea of infectious origin [A09] AKI (acute kidney injury) ([place]) [N17.9]  1. Acute renal failure with metabolic acidosis: with baseline creatinine of 0.82, GFR > 60 on 10/01/19.  with underlying diabetic nephropathy with glucosuria and proteinuria.  Secondary to diarrhea and prerenal azotemia.  Renal ultrasound negative. No IV contrast exposure.  Nonoliguric urine output. No indication for dialysis.   - Encourage PO intake.   2. Hypotension: holding blood pressure medications. Placed on digoxin by cardiology for atrial fibrillation.  - midodrine - discontinued tamsulosin.   3. Anemia with renal failure and thrombocytopenia: hemoglobin 6.7 Status post PRBC transfusion on 3/15. Scheduled for PRBC transfusion today.   4. Thrombocytopenia: off heparin infusion. Stopped linezolid.  Appreciate hematology input  5. Hypokalemia: from GI losses - PO potassium  - IV Kphos ordered for today.   6. Bacteremia with sepsis: pseudomonas, enterococcus and proteus on bone biopsy 09/26/19.  - Appreciate ID input. Cipro, metronidazole and daptomycin.      LOS: 7 Evelen Vazguez 3/19/20212:23 PM

## 2019-10-18 NOTE — Consult Note (Signed)
Pharmacy Antibiotic Note  Colton Lamb is a 69 y.o. male admitted on 10/11/2019 with infected sacral decubitus ulcer. Patient with acute renal failure with Scr 4.64 (baseline 0.7-0.8). Recently admitted 2/22-3/3 for sepsis secondary to Pseudomonas bacteremia and osteomyelitis. He is on outpatient antibiotics including pip/tazo 13.5 g over 24h as a continuous infusion (planned stop date 3/22) and linezolid 600 mg q12h (planned stop date 3/15). Patient came in with pip/tazo infusion running now stopped. Both Zosyn and Linezolid have not been stopped. Pharmacy has been consulted for ciprofloxacin and daptomycin dosing. Patient is also on metronidazole.   2/25 wound cultures with: E faecium, PSA, and proteus  Patient currently afebrile. WBC 10.6. Blood cultures NGTD. Intake weight was 108 kg and subsequent weights were charted at 80 - 88 kg. Nursing staff re-checked today and weight was 111 kg. Most recent weight appears most consistent with last admission.  Plan: Ciprofloxacin 453m IV Q12 hours. + flagyl 500 mg q8H   Daptomycin 800 mg q48h changed to q24h due to CrCl > 30. CK is WNL.   CK q72H per ID pharmacist while on daptomycin. Baseline CK 12 on 10/01/2019. Continue to monitor renal function and adjust antibiotics as indicated.  Height: 5' 11"  (180.3 cm) Weight: 246 lb (111.6 kg) IBW/kg (Calculated) : 75.3  Temp (24hrs), Avg:98.2 F (36.8 C), Min:98.1 F (36.7 C), Max:98.4 F (36.9 C)  Recent Labs  Lab 10/11/19 2315 10/12/19 0208 10/12/19 0821 10/14/19 0552 10/15/19 0433 10/16/19 0618 10/17/19 0514 10/18/19 0423  WBC  --   --    < > 9.7 10.6* 10.7* 10.1 11.4*  CREATININE  --   --    < > 3.68* 3.36* 2.92* 2.54* 2.19*  LATICACIDVEN 1.5 1.6  --   --   --   --   --   --    < > = values in this interval not displayed.    Estimated Creatinine Clearance: 41 mL/min (A) (by C-G formula based on SCr of 2.19 mg/dL (H)).    Allergies  Allergen Reactions  . Iodine Other (See  Comments)    Shortness of breath, swelling and hives  . Shrimp [Shellfish Allergy] Other (See Comments)    SWELLING    HIVES    SHORTNESS OF BREATH  . Tetracycline Rash    Antimicrobials this admission: Daptomycin 3/17 >> Metronidazole 3/16 >> Ciprofloxacin 3/16 >> Linezolid 3/12 >> 3/14 Pip/tazo 3/13 >> 3/16  Dose adjustments this admission: n/a  Microbiology results: 3/12 BCx: NGTD 3/12 C diff: negative 3/12 GI panel: negative  Thank you for allowing pharmacy to be a part of this patient's care.  KOswald Hillock PharmD, BCPS 10/18/2019 8:19 AM

## 2019-10-18 NOTE — Plan of Care (Signed)

## 2019-10-18 NOTE — TOC Progression Note (Addendum)
Transition of Care [facility]) - Progression Note    Patient Details  Name: Colton Lamb MRN: [id number] Date of Birth: October 07, 1950  Transition of Care [facility] CM/SW Contact  Eileen Stanford, LCSW Phone Number: 10/18/2019, 10:36 AM  Clinical Narrative:   CSW spoke with pt at bedside. CSW explained that pt was out of Medicare days therefore no matter which SNF he goes to he will need to pay upfront. Pt states he is agreeable to do so. CSW provided bed offers. Pt is agreeable to [facility]. Pt states his sister [name] handles his finances and pt prefers CSW call her to sort out payment. CSW will continue to follow up with pt and will also follow up with pt's sister as well.  3:05 - CSW has spoke to [facility], they are stating it will be 18,000$ upfront, and they will not accept him because the last covid test he had was positive. Ronney Lion is reviewing and will get a price quote.     Expected Discharge Plan: [place] Barriers to Discharge: Continued Medical Work up  Expected Discharge Plan and Services Expected Discharge Plan: [place] In-house Referral: Clinical Social Work   Post Acute Care Choice: [place] Living arrangements for the past 2 months: [place] Determinants of Health (SDOH) Interventions    Readmission Risk Interventions Readmission Risk Prevention Plan 10/14/2019 10/14/2019 07/18/2019  Transportation Screening Complete Complete Complete  Social Work Consult for [place] - - -  Medication Review Press photographer) Complete Complete Complete  PCP or Specialist appointment within 3-5 days of discharge Complete Complete (No Data)  [place] or [place] Complete Complete -  [place] or Home Care Consult Pt Refusal Comments - - return to SNF  SW Recovery Care/Counseling Consult Complete  Complete -  Palliative Care Screening Not Applicable Not Applicable -  [place] Not Applicable Not Applicable Complete  Some recent data might be hidden

## 2019-10-18 NOTE — Progress Notes (Addendum)
Progress Note    Colton Lamb  IRS:[id number] DOB: 18-May-1951  DOA: 10/11/2019 PCP: [name], MD      Brief Narrative:    Medical records reviewed and are as summarized below:  Colton Lamb is an 69 y.o. male       Assessment/Plan:   Principal Problem:   Acute renal failure (ARF) ([place]) Active Problems:   Hyperlipidemia   CAD S/P percutaneous coronary angioplasty - multiple PCIs   Atrial fibrillation ([place])   GERD   CROHN'S DISEASE-LARGE & SMALL INTESTINE   Hypertension, essential   Chronic systolic CHF (congestive heart failure) (HCC)   Normocytic anemia   Chronic osteomyelitis ([place])   Colostomy in place [facility])   Bacteremia due to Pseudomonas   Diarrhea   COVID-19 virus infection   Thrombocytopenia ([place])   Sacral decubitus ulcer, stage IV ([place])   Acute renal failure:Creatinine is slowly trending down. Baseline around 0.82. Likely due to prerenal secondary to dehydration.  Avoid nephrotoxics   Follow-up with nephrologist.  Monitor BMP  Diarrhea: Resolved.  C. diff PCR negative. GI path panel completely neg.   Hypokalemia, hypophosphatemia and hypomagnesemia: Replete potassium, phosphorus and magnesium with IV potassium phosphate and IV magnesium sulfate.  Monitor levels.  Hyperlipidemia: continue on statin   CAD: s/p multiple PCIs. Continue w/ aspirin and lipitor  Atrial fibrillation w/ RVR. PAF vs chronic.  Continue digoxin.  Anticoagulation on hold because of severe anemia and thrombocytopenia.    Hypotension: Overall, BP has improved.  Systolic BP has mostly been in the 90s.  This makes use of rate controlling drugs difficult.  Monitor BP closely.  GERD: continue on PPI   Crohns Disease: continue balsalazide. S/p colostomy  Chronic systolic KKX:[id number].  Echo on 09/28/2019 showed EF 25-30%.  Normocytic anemia & acute blood loss anemia/ severe anemia: Hemoglobin dropped from 7.2-6.7.  Transfuse 2 units of packed red blood  cells.  S/p 1 unit PRBCs 10/13/19.   Monitor H&H  Chronic osteomyelitis & Pseudomonas bacteremia: Sacral decubitus wound grew  Pseudomonas, VRE and Proteus in February 2021.  Continue current antibiotics.  Follow-up with ID.  Completed linezolid course 10/14/19. Repeat blood cx NGTD  Recommendations from ID are as follows:  "Stop date for the cipro flagyl and dapto is March 22.  If dced prior to this date can send on just cipro and flaygl as dapto unlikely to be covered. Will need aggressive wound care.  DC picc prior to discharge"  COVID-19 virus infection: asymptomatic  Thrombocytopenia: Platelet count is improving.  No need for a transfusion at this time.  Recommendations from hematologist noted.  Sacral decubitus ulcer, stage IV: present on admission. continue local wound care and wound VAC therapy.    Generalized weakness: PT and OT recommend SNF at discharge  Failure to thrive: lives at home alone. Cannot take care of himself at home and pt agrees with this.  Patient was not interested in palliative care.   Body mass index is 34.31 kg/m.  (Obesity)   Family Communication/Anticipated D/C date and plan/Code Status   DVT prophylaxis: SCDs.  Xarelto on hold because of thrombocytopenia Code Status: Full code Family Communication: Discussed with patient.  Disposition Plan: Plan to discharge to SNF when cleared by infectious disease specialist, cardiologist and hematologist      Subjective:   He does not talk much and he does not provide much history.  No chest pain, palpitations, dizziness or shortness of breath.  No bloody stools or  hematemesis   Objective:    Vitals:   10/18/19 1138 10/18/19 1304 10/18/19 1328 10/18/19 1523  BP: (!) 83/56 101/68 107/74 107/73  Pulse: 93 86 87 85  Resp: 18 17 17 18   Temp: 98.5 F (36.9 C) 98.4 F (36.9 C) 98.2 F (36.8 C) 98.1 F (36.7 C)  TempSrc:  Oral Oral Oral  SpO2: 99%     Weight:      Height:        Intake/Output  Summary (Last 24 hours) at 10/18/2019 1540 Last data filed at 10/18/2019 1523 Gross per 24 hour  Intake 927 ml  Output 3600 ml  Net -2673 ml   Filed Weights   10/14/19 0412 10/15/19 0351 10/16/19 1300  Weight: 88.5 kg 80.3 kg 111.6 kg    Exam:  GEN: NAD SKIN: Stage IV sacral decubitus ulcer connected to wound VAC EYES: Pale but anicteric ENT: MMM CV: Irregular rate and rhythm,  PULM: CTA B ABD: soft, ND, NT, +BS, some redness around colostomy bag.  Colostomy bag has yellowish-brown stools. CNS: AAO x 3, non focal EXT: No edema or tenderness GU: Foley catheter draining amber urine   Data Reviewed:   I have personally reviewed following labs and imaging studies:  Labs: Labs show the following:   Basic Metabolic Panel: Recent Labs  Lab 10/12/19 0821 10/12/19 1216 10/14/19 0552 10/14/19 0552 10/15/19 0433 10/15/19 0433 10/16/19 0618 10/16/19 0618 10/17/19 0514 10/18/19 0423  NA 142   < > 143  --  144  --  144  --  143 142  K 3.6   < > 3.9   < > 3.5   < > 3.0*   < > 3.4* 3.3*  CL 107   < > 103  --  101  --  100  --  102 101  CO2 18*   < > 26  --  33*  --  34*  --  33* 29  GLUCOSE 90   < > 111*  --  113*  --  100*  --  109* 112*  BUN 69*   < > 59*  --  55*  --  55*  --  45* 40*  CREATININE 4.42*   < > 3.68*  --  3.36*  --  2.92*  --  2.54* 2.19*  CALCIUM 7.7*   < > 7.6*  --  7.4*  --  7.4*  --  7.7* 7.6*  MG 1.8  --   --   --   --   --  1.3*  --  1.5* 1.6*  PHOS  --   --   --   --   --   --   --   --  1.2* 1.6*   < > = values in this interval not displayed.   GFR Estimated Creatinine Clearance: 41 mL/min (A) (by C-G formula based on SCr of 2.19 mg/dL (H)). Liver Function Tests: No results for input(s): AST, ALT, ALKPHOS, BILITOT, PROT, ALBUMIN in the last 168 hours. No results for input(s): LIPASE, AMYLASE in the last 168 hours. No results for input(s): AMMONIA in the last 168 hours. Coagulation profile Recent Labs  Lab 10/11/19 2107  INR 1.4*     CBC: Recent Labs  Lab 10/14/19 0552 10/15/19 0433 10/16/19 0618 10/17/19 0514 10/18/19 0423  WBC 9.7 10.6* 10.7* 10.1 11.4*  NEUTROABS  --   --   --  7.3 7.7  HGB 7.9* 7.3* 7.4* 7.2* 6.7*  HCT 25.7*  23.1* 23.5* 22.2* 21.6*  MCV 87.4 87.8 87.0 85.1 86.4  PLT 41* 36* 45* 68* 120*   Cardiac Enzymes: Recent Labs  Lab 10/17/19 0514  CKTOTAL 22*   BNP (last 3 results) No results for input(s): PROBNP in the last 8760 hours. CBG: No results for input(s): GLUCAP in the last 168 hours. D-Dimer: No results for input(s): DDIMER in the last 72 hours. Hgb A1c: No results for input(s): HGBA1C in the last 72 hours. Lipid Profile: No results for input(s): CHOL, HDL, LDLCALC, TRIG, CHOLHDL, LDLDIRECT in the last 72 hours. Thyroid function studies: No results for input(s): TSH, T4TOTAL, T3FREE, THYROIDAB in the last 72 hours.  Invalid input(s): FREET3 Anemia work up: Recent Labs    10/17/19 0514 10/17/19 1107  VITAMINB12  --  596  FOLATE 5.0*  --   FERRITIN 255  --   TIBC 150*  --   IRON 18*  --    Sepsis Labs: Recent Labs  Lab 10/11/19 2315 10/12/19 0208 10/12/19 0821 10/15/19 0433 10/16/19 0618 10/17/19 0514 10/18/19 0423  WBC  --   --    < > 10.6* 10.7* 10.1 11.4*  LATICACIDVEN 1.5 1.6  --   --   --   --   --    < > = values in this interval not displayed.    Microbiology Recent Results (from the past 240 hour(s))  GI pathogen panel by PCR, stool     Status: None   Collection Time: 10/11/19  9:33 AM   Specimen: STOOL  Result Value Ref Range Status   Plesiomonas shigelloides NOT DETECTED NOT DETECTED Final   Yersinia enterocolitica NOT DETECTED NOT DETECTED Final   Vibrio NOT DETECTED NOT DETECTED Final   Enteropathogenic E coli NOT DETECTED NOT DETECTED Final   E coli (ETEC) LT/ST NOT DETECTED NOT DETECTED Final   E coli 1540 by PCR Not applicable NOT DETECTED Final   Cryptosporidium by PCR NOT DETECTED NOT DETECTED Final   Entamoeba histolytica NOT  DETECTED NOT DETECTED Final   Adenovirus F 40/41 NOT DETECTED NOT DETECTED Final   Norovirus GI/GII NOT DETECTED NOT DETECTED Final   Sapovirus NOT DETECTED NOT DETECTED Final    Comment: (NOTE) Performed At: [facility] [place], [place] [id number] Rush Farmer MD DT:[id number]    Vibrio cholerae NOT DETECTED NOT DETECTED Final   Campylobacter by PCR NOT DETECTED NOT DETECTED Final   Salmonella by PCR NOT DETECTED NOT DETECTED Final   E coli (STEC) NOT DETECTED NOT DETECTED Final   Enteroaggregative E coli NOT DETECTED NOT DETECTED Final   Shigella by PCR NOT DETECTED NOT DETECTED Final   Cyclospora cayetanensis NOT DETECTED NOT DETECTED Final   Astrovirus NOT DETECTED NOT DETECTED Final   G lamblia by PCR NOT DETECTED NOT DETECTED Final   Rotavirus A by PCR NOT DETECTED NOT DETECTED Final  C Difficile Quick Screen w PCR reflex     Status: None   Collection Time: 10/11/19  9:33 AM   Specimen: STOOL  Result Value Ref Range Status   C Diff antigen NEGATIVE NEGATIVE Final   C Diff toxin NEGATIVE NEGATIVE Final   C Diff interpretation No C. difficile detected.  Final    Comment: Performed at [facility], [address] [place], [place] [address]  Culture, blood (Routine X 2) w Reflex to ID Panel     Status: None   Collection Time: 10/11/19  4:34 PM   Specimen: BLOOD RIGHT  HAND  Result Value Ref Range Status   Specimen Description BLOOD RIGHT HAND  Final   Special Requests   Final    BOTTLES DRAWN AEROBIC ONLY Blood Culture results may not be optimal due to an inadequate volume of blood received in culture bottles   Culture   Final    NO GROWTH 5 DAYS Performed at [facility], [address] [place], [place] [address]    Report Status 10/16/2019 FINAL  Final    Procedures and diagnostic studies:  No results found.  Medications:   . ascorbic acid  500 mg Oral BID  . atorvastatin  20 mg Oral Daily  . balsalazide  750 mg Oral  TID  . Chlorhexidine Gluconate Cloth  6 each Topical Daily  . collagenase  1 application Topical Daily  . digoxin  0.0625 mg Oral Q48H  . feeding supplement (NEPRO CARB STEADY)  237 mL Oral TID BM  . finasteride  5 mg Oral Daily  . influenza vaccine adjuvanted  0.5 mL Intramuscular Tomorrow-1000  . lactobacillus  1 g Oral TID WC  . loperamide  2 mg Oral Q8H  . midodrine  10 mg Oral TID  . multivitamin with minerals  1 tablet Oral Daily  . pantoprazole  40 mg Oral Daily  . zinc sulfate  220 mg Oral Daily   Continuous Infusions: . sodium chloride 10 mL/hr at 10/17/19 0845  . ciprofloxacin 400 mg (10/18/19 1111)  . DAPTOmycin (CUBICIN)  IV Stopped (10/17/19 1954)  . metronidazole 500 mg (10/18/19 1533)  . potassium PHOSPHATE IVPB (in mmol) 30 mmol (10/18/19 1216)     LOS: 7 days   Srihitha Tagliaferri  [facility]     10/18/2019, 3:40 PM

## 2019-10-18 NOTE — Progress Notes (Signed)
Progress Note  Patient Name: Colton Lamb Date of Encounter: 10/18/2019  Primary Cardiologist: Ida Rogue, MD  Subjective   No c/p, sob, palpitations.  Maintaining sinus rhythm.  Inpatient Medications    Scheduled Meds: . sodium chloride   Intravenous Once  . ascorbic acid  500 mg Oral BID  . atorvastatin  20 mg Oral Daily  . balsalazide  750 mg Oral TID  . Chlorhexidine Gluconate Cloth  6 each Topical Daily  . collagenase  1 application Topical Daily  . digoxin  0.0625 mg Oral Q48H  . feeding supplement (NEPRO CARB STEADY)  237 mL Oral TID BM  . finasteride  5 mg Oral Daily  . influenza vaccine adjuvanted  0.5 mL Intramuscular Tomorrow-1000  . lactobacillus  1 g Oral TID WC  . loperamide  2 mg Oral Q8H  . midodrine  10 mg Oral TID  . multivitamin with minerals  1 tablet Oral Daily  . pantoprazole  40 mg Oral Daily  . potassium chloride  40 mEq Oral Once  . zinc sulfate  220 mg Oral Daily   Continuous Infusions: . sodium chloride 10 mL/hr at 10/17/19 0845  . ciprofloxacin Stopped (10/17/19 2203)  . DAPTOmycin (CUBICIN)  IV Stopped (10/17/19 1954)  . magnesium sulfate bolus IVPB    . metronidazole 500 mg (10/18/19 0609)  . potassium PHOSPHATE IVPB (in mmol)     PRN Meds: sodium chloride, acetaminophen **OR** acetaminophen, ipratropium-albuterol, ondansetron (ZOFRAN) IV   Vital Signs    Vitals:   10/17/19 0742 10/17/19 1129 10/17/19 2251 10/18/19 0519  BP: 116/66 104/78 117/65 109/66  Pulse: 98 72 95 94  Resp:  18 20 20   Temp:  98.1 F (36.7 C) 98.4 F (36.9 C) 98.2 F (36.8 C)  TempSrc:  Oral Oral Oral  SpO2: 100% 100% 98% 100%  Weight:      Height:        Intake/Output Summary (Last 24 hours) at 10/18/2019 0830 Last data filed at 10/18/2019 0600 Gross per 24 hour  Intake 100.03 ml  Output 2800 ml  Net -2699.97 ml   Filed Weights   10/14/19 0412 10/15/19 0351 10/16/19 1300  Weight: 88.5 kg 80.3 kg 111.6 kg    Physical Exam   GEN: Well  nourished, well developed, in no acute distress.  HEENT: Grossly normal.  Neck: Supple, no JVD, carotid bruits, or masses. Cardiac: RRR, no murmurs, rubs, or gallops. No clubbing, cyanosis, edema.  DP/PT 2+ and equal bilaterally.  Respiratory:  Respirations regular and unlabored, diminished breath sounds @ bilat bases, otw CTA. GI: Soft, nontender, nondistended, BS + x 4. LLQ colostomy. MS: no deformity or atrophy. Skin: warm and dry, no rash. Neuro:  Strength and sensation are intact. Psych: AAOx3.  Normal affect.  Labs    Chemistry Recent Labs  Lab 10/11/19 0933 10/12/19 0821 10/16/19 0618 10/17/19 0514 10/18/19 0423  NA 139   < > 144 143 142  K 3.9   < > 3.0* 3.4* 3.3*  CL 107   < > 100 102 101  CO2 16*   < > 34* 33* 29  GLUCOSE 94   < > 100* 109* 112*  BUN 73*   < > 55* 45* 40*  CREATININE 4.64*   < > 2.92* 2.54* 2.19*  CALCIUM 7.9*   < > 7.4* 7.7* 7.6*  PROT 6.2*  --   --   --   --   ALBUMIN 2.1*  --   --   --   --  AST 19  --   --   --   --   ALT 15  --   --   --   --   ALKPHOS 88  --   --   --   --   BILITOT 0.9  --   --   --   --   GFRNONAA 12*   < > 21* 25* 30*  GFRAA 14*   < > 24* 29* 35*  ANIONGAP 16*   < > 10 8 12    < > = values in this interval not displayed.     Hematology Recent Labs  Lab 10/16/19 0618 10/17/19 0514 10/18/19 0423  WBC 10.7* 10.1 11.4*  RBC 2.70* 2.61* 2.50*  HGB 7.4* 7.2* 6.7*  HCT 23.5* 22.2* 21.6*  MCV 87.0 85.1 86.4  MCH 27.4 27.6 26.8  MCHC 31.5 32.4 31.0  RDW 20.0* 19.9* 19.7*  PLT 45* 68* 120*      BNP Recent Labs  Lab 10/11/19 0933  BNP 1,655.0*       Radiology    No results found.  Telemetry    Sinus rhythm, sinus tach, PACs - Personally Reviewed  Cardiac Studies   2D Echocardiogram 09/2019  TTE 09/2019 1. Left ventricular ejection fraction, by estimation, is 25 to 30%. The  left ventricle has severely decreased function. The left ventricle  demonstrates regional wall motion abnormalities ,  anterior , anterospetal  and apical wall hypokinesis.  2. Right ventricular systolic function is normal. The right ventricular  size is normal. There is mildly elevated pulmonary artery systolic  pressure.  3. Left atrial size was mildly dilated.  4. No vegetation noted.  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  Assessment & Plan    1.  Diarrhea: improved.  2.  Acute renal failure:  In setting of above.  Creat improving  2.19 this AM.  3. Pseudomonal bacteremia/chronic sacral decubitus:  Abx per ID.  4. Microcytic Anemia:  Xarelto on hold however H/H cont to drift down  6.7/21.6 this AM.  Rec transfusion - defer to IM/heme.  5.  PAF w/ RVR:  In setting of #1, 2,3.  Maintaining sinus rhythm since 3/17 AM.  On low-dose digoxin - dose reduced this admission in setting of ARF.  Dig level 0.7 on 3/16.  Xarelto on hold in setting of anemia/thrombocytopenia.  6.  Hypokalemia: supplementation ordered.  7.  HFrEF/ICM:  EF 25-30%. Remains euvolemic on exam. Intakes inaccurate.  Historically low blood pressures req midodrine and preventing use of  blocker, acei/arb/arni/mra.  He remains on low dose digoxin.  8. Thrombocytopenia: 2/2 zosyn/linezolid.  Continues to improve.  9.  CAD:  No c/p.  Cont statin.  No ASA in setting of outpt xarelto rx (on hold 2/2 anemia/thrombocytopenia).  10.  HL:  Cont statin.  LDL 52 in 12/2018.  Signed, Murray Hodgkins, NP  10/18/2019, 8:30 AM    For questions or updates, please contact   Please consult [email or web address] for contact info under  Cardiology/STEMI.

## 2019-10-18 NOTE — Progress Notes (Signed)
Occupational Therapy Treatment Patient Details Name: Colton Lamb MRN: [id number] DOB: November 14, 1950 Today's Date: 10/18/2019    History of present illness Per MD note: 69 y.o. male was recently in hospital and admitted from home due to leaking colostomy bag and acute renal failure.  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  Also found to be COVID positive. Being treated for pseudomonas bacteremia and possible acute on chronic osteomyelitis. Patient has multiple pressure ulcers present on arrival. Pt well known to therapy and reports he hasn't ambulated in almost 1 year. Has been receiving care at home with a [facility] aide and his sister who indicates she can no longer care for him..   OT comments  Mr Wesch was seen for OT treatment on this date. Upon arrival to room pt was asleep but easily awoken. Pt reclined in bed reporting no pain. Pt agreeable to tx. Pt instructed in BUE HEP with theraband and written handout provided. Pt return demonstrated instruction provided performing 1 set x 10-12 reps each of shoulder foreward thrusts, anchored elbow flexion, and shoulder horizontal abduction from a supine position with BUE. Pt making good progress toward goals. Pt continues to benefit from skilled OT services to maximize return to PLOF and minimize risk of future falls, injury, caregiver burden, and readmission. Will continue to follow POC. Discharge recommendation remains appropriate.    Follow Up Recommendations  SNF;LTACH(SNF)    Equipment Recommendations  Other (comment)(defer to next level of care. Pt is apparently planning to re)    Recommendations for Other Services      Precautions / Restrictions Precautions Precautions: Fall Precaution Comments: sacral wound        Mobility Bed Mobility                  Transfers                      Balance                                           ADL either performed or assessed with clinical judgement   ADL Overall ADL's : Needs assistance/impaired                                       General ADL Comments: TOTAL A to reposition foley for appropriate draining     Vision       Perception     Praxis      Cognition Arousal/Alertness: Awake/alert Behavior During Therapy: WFL for tasks assessed/performed Overall Cognitive Status: Within Functional Limits for tasks assessed                                          Exercises Other Exercises Other Exercises: BUE HEP c handout and theraband provided. Perfomed supine with BUE: 1 set x 10 reps of arm foreward punches, 1 set x 12 reps of shoulder horizontal abduction, 1 set x 12 reps of elbow flexion with anchored resistance.    Shoulder  Instructions       General Comments      Pertinent Vitals/ Pain       Pain Assessment: Faces Faces Pain Scale: Hurts a little bit  Home Living                                          Prior Functioning/Environment              Frequency  Min 2X/week        Progress Toward Goals  OT Goals(current goals can now be found in the care plan section)  Progress towards OT goals: Progressing toward goals  Acute Rehab OT Goals Patient Stated Goal: I will consider going to rehab OT Goal Formulation: With patient Time For Goal Achievement: 10/14/19 Potential to Achieve Goals: Fair ADL Goals Pt Will Perform Grooming: with set-up;sitting;with mod assist(MOD A for sup<>sit and to sustain static sitting balance to ) Pt Will Perform Lower Body Dressing: with set-up;with max assist;bed level Pt Will Transfer to Toilet: with set-up;bedside commode;with +2 assist;stand pivot transfer;with max assist Pt/caregiver will Perform  Home Exercise Program: Increased strength;Both right and left upper extremity;With written HEP provided;With theraband Additional ADL Goal #1: Pt will require MIN A for sup<>sit in prep for seated UB ADLs.  Plan Discharge plan remains appropriate;Frequency remains appropriate    Co-evaluation                 AM-PAC OT "6 Clicks" Daily Activity     Outcome Measure   Help from another person eating meals?: None Help from another person taking care of personal grooming?: None Help from another person toileting, which includes using toliet, bedpan, or urinal?: A Lot Help from another person bathing (including washing, rinsing, drying)?: A Lot Help from another person to put on and taking off regular upper body clothing?: A Little Help from another person to put on and taking off regular lower body clothing?: Total 6 Click Score: 16    End of Session    OT Visit Diagnosis: Muscle weakness (generalized) (M62.81);Other abnormalities of gait and mobility (R26.89) Pain - Right/Left: Left Pain - part of body: Hip   Activity Tolerance Patient tolerated treatment well   Patient Left in bed;with call bell/phone within reach;with bed alarm set;with nursing/sitter in room(RN in room )   Nurse Communication          Time: 6720-9470 OT Time Calculation (min): 9 min  Charges: OT General Charges $OT Visit: 1 Visit OT Treatments $Self Care/Home Management : 8-22 mins  Dessie Coma, M.S. OTR/L  10/18/19, 4:16 PM

## 2019-10-18 NOTE — Care Management Important Message (Signed)
Important Message  Patient Details  Name: Colton Lamb MRN: [id number] Date of Birth: [birth year]   Medicare Important Message Given:  Other (see comment)  Attempted to reach patient via room phone due to isolation status to review Medicare IM, however no answer.  Will attempt at later time.    Dannette Barbara 10/18/2019, 2:03 PM

## 2019-10-19 DIAGNOSIS — I4811 Longstanding persistent atrial fibrillation: Secondary | ICD-10-CM

## 2019-10-19 LAB — BASIC METABOLIC PANEL
Anion gap: 9 (ref 5–15)
BUN: 30 mg/dL — ABNORMAL HIGH (ref 8–23)
CO2: 28 mmol/L (ref 22–32)
Calcium: 7.9 mg/dL — ABNORMAL LOW (ref 8.9–10.3)
Chloride: 104 mmol/L (ref 98–111)
Creatinine, Ser: 1.99 mg/dL — ABNORMAL HIGH (ref 0.61–1.24)
GFR calc Af Amer: 39 mL/min — ABNORMAL LOW (ref >60)
GFR calc non Af Amer: 34 mL/min — ABNORMAL LOW (ref >60)
Glucose, Bld: 93 mg/dL (ref 70–99)
Potassium: 3.8 mmol/L (ref 3.5–5.1)
Sodium: 141 mmol/L (ref 135–145)

## 2019-10-19 LAB — CBC WITH DIFFERENTIAL/PLATELET
Abs Immature Granulocytes: 0.36 10*3/uL — ABNORMAL HIGH (ref 0.00–0.07)
Basophils Absolute: 0.1 10*3/uL (ref 0.0–0.1)
Basophils Relative: 1 %
Eosinophils Absolute: 0.4 10*3/uL (ref 0.0–0.5)
Eosinophils Relative: 3 %
HCT: 25.3 % — ABNORMAL LOW (ref 39.0–52.0)
Hemoglobin: 8.3 g/dL — ABNORMAL LOW (ref 13.0–17.0)
Immature Granulocytes: 3 %
Lymphocytes Relative: 14 %
Lymphs Abs: 1.8 10*3/uL (ref 0.7–4.0)
MCH: 28.2 pg (ref 26.0–34.0)
MCHC: 32.8 g/dL (ref 30.0–36.0)
MCV: 86.1 fL (ref 80.0–100.0)
Monocytes Absolute: 1.4 10*3/uL — ABNORMAL HIGH (ref 0.1–1.0)
Monocytes Relative: 11 %
Neutro Abs: 8.7 10*3/uL — ABNORMAL HIGH (ref 1.7–7.7)
Neutrophils Relative %: 68 %
Platelets: 155 10*3/uL (ref 150–400)
RBC: 2.94 MIL/uL — ABNORMAL LOW (ref 4.22–5.81)
RDW: 18.9 % — ABNORMAL HIGH (ref 11.5–15.5)
WBC: 12.8 10*3/uL — ABNORMAL HIGH (ref 4.0–10.5)
nRBC: 0 % (ref 0.0–0.2)

## 2019-10-19 LAB — TYPE AND SCREEN
ABO/RH(D): B POS
Antibody Screen: NEGATIVE
Unit division: 0

## 2019-10-19 LAB — BPAM RBC
Blood Product Expiration Date: 202104152359
ISSUE DATE / TIME: 202103191245
Unit Type and Rh: 7300

## 2019-10-19 LAB — PHOSPHORUS: Phosphorus: 2.8 mg/dL (ref 2.5–4.6)

## 2019-10-19 LAB — MAGNESIUM: Magnesium: 1.7 mg/dL (ref 1.7–2.4)

## 2019-10-19 MED ORDER — RIVAROXABAN 20 MG PO TABS
20.0000 mg | ORAL_TABLET | Freq: Every day | ORAL | Status: DC
  Administered 2019-10-19 – 2019-10-22 (×4): 20 mg via ORAL
  Filled 2019-10-19 (×4): qty 1

## 2019-10-19 MED ORDER — SODIUM CHLORIDE 0.9% FLUSH: 10.0000 mL | INTRAVENOUS | Status: DC | PRN

## 2019-10-19 MED ORDER — SODIUM CHLORIDE 0.9% FLUSH
10.0000 mL | Freq: Two times a day (BID) | INTRAVENOUS | Status: DC
  Administered 2019-10-19 – 2019-10-22 (×7): 10 mL

## 2019-10-19 NOTE — Progress Notes (Signed)
Progress Note    Colton Lamb  IOX:[id number] DOB: 02/24/1951  DOA: 10/11/2019 PCP: [name], MD      Brief Narrative:    Medical records reviewed and are as summarized below:  Colton Lamb is an 69 y.o. male       Assessment/Plan:   Principal Problem:   Acute renal failure (ARF) ([place]) Active Problems:   Hyperlipidemia   CAD S/P percutaneous coronary angioplasty - multiple PCIs   Atrial fibrillation ([place])   GERD   CROHN'S DISEASE-LARGE & SMALL INTESTINE   Hypertension, essential   Chronic systolic CHF (congestive heart failure) ([facility])   Normocytic anemia   Chronic osteomyelitis ([place])   Colostomy in place [facility])   Bacteremia due to Pseudomonas   Diarrhea   COVID-19 virus infection   Thrombocytopenia ([place])   Sacral decubitus ulcer, stage IV ([place])   Acute renal failure:Creatinine is slowly trending down. Baseline around 0.82. Likely due to prerenal secondary to dehydration.  Avoid nephrotoxics   Follow-up with nephrologist.  Monitor BMP  Diarrhea: Resolved.  C. diff PCR negative. GI path panel completely neg.   Hypokalemia, hypophosphatemia and hypomagnesemia: Improved.  Hyperlipidemia: continue on statin   CAD: s/p multiple PCIs. Continue w/ aspirin and lipitor  Atrial fibrillation w/ RVR. PAF vs chronic.  Continue digoxin.  Because of high risk for stroke.  Cardiologist has signed off.  Hypotension: Overall, BP has improved but BP still borderline low. This makes use of rate controlling drugs difficult.  Monitor BP closely.  GERD: continue on PPI   Crohns Disease: continue balsalazide. S/p colostomy  Chronic systolic QAS:[id number].  Echo on 09/28/2019 showed EF 25-30%.  Normocytic anemia & acute blood loss anemia/ severe anemia: H&H improved.  s/p transfusion with 2 units of prbc on 10/18/2019.  S/p 1 unit PRBCs 10/13/19.   Monitor H&H  Chronic osteomyelitis & Pseudomonas bacteremia: Sacral decubitus wound grew  Pseudomonas, VRE  and Proteus in February 2021.  Continue current antibiotics.  Follow-up with ID.  Completed linezolid course 10/14/19. Repeat blood cx NGTD  Recommendations from ID are as follows:  "Stop date for the cipro flagyl and dapto is March 22.  If dced prior to this date can send on just cipro and flaygl as dapto unlikely to be covered. Will need aggressive wound care.  DC picc prior to discharge"  COVID-19 virus infection: asymptomatic  Thrombocytopenia: Resolved.  Hematologist has signed off.  Sacral decubitus ulcer, stage IV: present on admission. continue local wound care and wound VAC therapy.    Generalized weakness: PT and OT recommend SNF at discharge  Failure to thrive: lives at home alone. Cannot take care of himself at home and pt agrees with this.  Patient was not interested in palliative care.   Body mass index is 34.99 kg/m.  (Obesity)   Family Communication/Anticipated D/C date and plan/Code Status   DVT prophylaxis: SCDs.  Xarelto on hold because of thrombocytopenia Code Status: Full code Family Communication: Discussed with patient.  Disposition Plan: Patient is from SNF.  Plan to discharge to SNF in 1 to 2 days.  Follow-up with social worker to assist with disposition.      Subjective:   No complaints.  No shortness of breath or chest pain.  Objective:    Vitals:   10/18/19 1550 10/18/19 1936 10/19/19 0412 10/19/19 0925  BP: 113/79 91/61 104/69 95/64  Pulse: 89 92 87 100  Resp: 18 18 16    Temp: 98.1 F (36.7 C)  98.1 F (36.7 C) 97.9 F (36.6 C) 97.8 F (36.6 C)  TempSrc:  Oral Oral Oral  SpO2: 100% 100% 100% 100%  Weight:   113.8 kg   Height:        Intake/Output Summary (Last 24 hours) at 10/19/2019 1652 Last data filed at 10/19/2019 0940 Gross per 24 hour  Intake 852.48 ml  Output 1150 ml  Net -297.52 ml   Filed Weights   10/15/19 0351 10/16/19 1300 10/19/19 0412  Weight: 80.3 kg 111.6 kg 113.8 kg    Exam:  GEN: No acute distress    SKIN: Stage IV sacral decubitus ulcer connected to wound VAC EYES: Pale but anicteric ENT: MMM CV: Irregular rate and rhythm,  PULM: No wheezing or rales heard ABD: soft, ND, NT, +BS, some redness around colostomy bag.  Colostomy bag has yellowish-brown stools. CNS: AAO x 3, non focal EXT: No edema or tenderness GU: Foley catheter draining amber urine   Data Reviewed:   I have personally reviewed following labs and imaging studies:  Labs: Labs show the following:   Basic Metabolic Panel: Recent Labs  Lab 10/15/19 0433 10/15/19 0433 10/16/19 0618 10/16/19 0618 10/17/19 0514 10/17/19 0514 10/18/19 0423 10/19/19 0616  NA 144  --  144  --  143  --  142 141  K 3.5   < > 3.0*   < > 3.4*   < > 3.3* 3.8  CL 101  --  100  --  102  --  101 104  CO2 33*  --  34*  --  33*  --  29 28  GLUCOSE 113*  --  100*  --  109*  --  112* 93  BUN 55*  --  55*  --  45*  --  40* 30*  CREATININE 3.36*  --  2.92*  --  2.54*  --  2.19* 1.99*  CALCIUM 7.4*  --  7.4*  --  7.7*  --  7.6* 7.9*  MG  --   --  1.3*  --  1.5*  --  1.6* 1.7  PHOS  --   --   --   --  1.2*  --  1.6* 2.8   < > = values in this interval not displayed.   GFR Estimated Creatinine Clearance: 45.6 mL/min (A) (by C-G formula based on SCr of 1.99 mg/dL (H)). Liver Function Tests: No results for input(s): AST, ALT, ALKPHOS, BILITOT, PROT, ALBUMIN in the last 168 hours. No results for input(s): LIPASE, AMYLASE in the last 168 hours. No results for input(s): AMMONIA in the last 168 hours. Coagulation profile No results for input(s): INR, PROTIME in the last 168 hours.  CBC: Recent Labs  Lab 10/15/19 0433 10/16/19 0618 10/17/19 0514 10/18/19 0423 10/19/19 0616  WBC 10.6* 10.7* 10.1 11.4* 12.8*  NEUTROABS  --   --  7.3 7.7 8.7*  HGB 7.3* 7.4* 7.2* 6.7* 8.3*  HCT 23.1* 23.5* 22.2* 21.6* 25.3*  MCV 87.8 87.0 85.1 86.4 86.1  PLT 36* 45* 68* 120* 155   Cardiac Enzymes: Recent Labs  Lab 10/17/19 0514  CKTOTAL 22*   BNP  (last 3 results) No results for input(s): PROBNP in the last 8760 hours. CBG: No results for input(s): GLUCAP in the last 168 hours. D-Dimer: No results for input(s): DDIMER in the last 72 hours. Hgb A1c: No results for input(s): HGBA1C in the last 72 hours. Lipid Profile: No results for input(s): CHOL, HDL, LDLCALC, TRIG, CHOLHDL, LDLDIRECT in the  last 72 hours. Thyroid function studies: No results for input(s): TSH, T4TOTAL, T3FREE, THYROIDAB in the last 72 hours.  Invalid input(s): FREET3 Anemia work up: Recent Labs    10/17/19 0514 10/17/19 1107  VITAMINB12  --  596  FOLATE 5.0*  --   FERRITIN 255  --   TIBC 150*  --   IRON 18*  --    Sepsis Labs: Recent Labs  Lab 10/16/19 0618 10/17/19 0514 10/18/19 0423 10/19/19 0616  WBC 10.7* 10.1 11.4* 12.8*    Microbiology Recent Results (from the past 240 hour(s))  GI pathogen panel by PCR, stool     Status: None   Collection Time: 10/11/19  9:33 AM   Specimen: STOOL  Result Value Ref Range Status   Plesiomonas shigelloides NOT DETECTED NOT DETECTED Final   Yersinia enterocolitica NOT DETECTED NOT DETECTED Final   Vibrio NOT DETECTED NOT DETECTED Final   Enteropathogenic E coli NOT DETECTED NOT DETECTED Final   E coli (ETEC) LT/ST NOT DETECTED NOT DETECTED Final   E coli 2633 by PCR Not applicable NOT DETECTED Final   Cryptosporidium by PCR NOT DETECTED NOT DETECTED Final   Entamoeba histolytica NOT DETECTED NOT DETECTED Final   Adenovirus F 40/41 NOT DETECTED NOT DETECTED Final   Norovirus GI/GII NOT DETECTED NOT DETECTED Final   Sapovirus NOT DETECTED NOT DETECTED Final    Comment: (NOTE) Performed At: [facility] [place], [place] [id number] Rush Farmer MD SL:[id number]    Vibrio cholerae NOT DETECTED NOT DETECTED Final   Campylobacter by PCR NOT DETECTED NOT DETECTED Final   Salmonella by PCR NOT DETECTED NOT DETECTED Final   E coli (STEC) NOT DETECTED NOT DETECTED Final    Enteroaggregative E coli NOT DETECTED NOT DETECTED Final   Shigella by PCR NOT DETECTED NOT DETECTED Final   Cyclospora cayetanensis NOT DETECTED NOT DETECTED Final   Astrovirus NOT DETECTED NOT DETECTED Final   G lamblia by PCR NOT DETECTED NOT DETECTED Final   Rotavirus A by PCR NOT DETECTED NOT DETECTED Final  C Difficile Quick Screen w PCR reflex     Status: None   Collection Time: 10/11/19  9:33 AM   Specimen: STOOL  Result Value Ref Range Status   C Diff antigen NEGATIVE NEGATIVE Final   C Diff toxin NEGATIVE NEGATIVE Final   C Diff interpretation No C. difficile detected.  Final    Comment: Performed at [facility], [address] [place], [place] [address]  Culture, blood (Routine X 2) w Reflex to ID Panel     Status: None   Collection Time: 10/11/19  4:34 PM   Specimen: BLOOD RIGHT HAND  Result Value Ref Range Status   Specimen Description BLOOD RIGHT HAND  Final   Special Requests   Final    BOTTLES DRAWN AEROBIC ONLY Blood Culture results may not be optimal due to an inadequate volume of blood received in culture bottles   Culture   Final    NO GROWTH 5 DAYS Performed at [facility], [address] [place], [place] [address]    Report Status 10/16/2019 FINAL  Final    Procedures and diagnostic studies:  No results found.  Medications:   . ascorbic acid  500 mg Oral BID  . atorvastatin  20 mg Oral Daily  . balsalazide  750 mg Oral TID  . Chlorhexidine Gluconate Cloth  6 each Topical Daily  . collagenase  1 application Topical Daily  .  digoxin  0.0625 mg Oral Q48H  . feeding supplement (NEPRO CARB STEADY)  237 mL Oral TID BM  . finasteride  5 mg Oral Daily  . influenza vaccine adjuvanted  0.5 mL Intramuscular Tomorrow-1000  . lactobacillus  1 g Oral TID WC  . loperamide  2 mg Oral Q8H  . midodrine  10 mg Oral TID  . multivitamin with minerals  1 tablet Oral Daily  . pantoprazole  40 mg Oral Daily  . rivaroxaban  20 mg Oral Q supper  .  zinc sulfate  220 mg Oral Daily   Continuous Infusions: . sodium chloride 250 mL (10/19/19 0622)  . ciprofloxacin 400 mg (10/19/19 1149)  . DAPTOmycin (CUBICIN)  IV Stopped (10/18/19 1857)  . metronidazole 500 mg (10/19/19 1516)     LOS: 8 days   Magnum Lunde  [facility]     10/19/2019, 4:52 PM

## 2019-10-19 NOTE — Plan of Care (Signed)

## 2019-10-19 NOTE — Progress Notes (Signed)
Platelet count has normalized. Thrombocytopenia due to antibiotics.  HemOnc will sign off. Please call if additional concerns or questions.

## 2019-10-19 NOTE — Progress Notes (Signed)
Upon performing a chart review and two audits just now, I found patient to have an 74 day old PICC line dressing. The flowsheet has no anticipated dressing change date, and the paper audit states that the dressing is within the acceptable change regimen. Also, there was no adult central line maintenance order set entered, ever (on a patient with an 8 day LOS). Again, the paper audit states at least once that at some point this was ordered and verified (clearly inaccurate). I have already ordered and acknowledged this as well. Reached out to the physician via secure chat and inquired as to whether the PICC could be removed tonight. He stated it couldn't. Will pass along in shift report that this needs done tonight (dressing change). Also, will reach out to physician again regarding updated wound care orders. It appears the [place] RN was consulted for the patient's ostomy, but there are no wound care orders r/t patient's sacral area, which per the physician's notes existed as a stage 4 pressure injury as recently as last month. Will pass along in shift report as well. Will continue to monitor patient for last 10 minutes. Wenda Low [facility]

## 2019-10-19 NOTE — Progress Notes (Signed)
Progress Note  Patient Name: Colton Lamb Date of Encounter: 10/19/2019  Primary Cardiologist: Ida Rogue, MD   Subjective   "I'm ok."  Inpatient Medications    Scheduled Meds: . ascorbic acid  500 mg Oral BID  . atorvastatin  20 mg Oral Daily  . balsalazide  750 mg Oral TID  . Chlorhexidine Gluconate Cloth  6 each Topical Daily  . collagenase  1 application Topical Daily  . digoxin  0.0625 mg Oral Q48H  . feeding supplement (NEPRO CARB STEADY)  237 mL Oral TID BM  . finasteride  5 mg Oral Daily  . influenza vaccine adjuvanted  0.5 mL Intramuscular Tomorrow-1000  . lactobacillus  1 g Oral TID WC  . loperamide  2 mg Oral Q8H  . midodrine  10 mg Oral TID  . multivitamin with minerals  1 tablet Oral Daily  . pantoprazole  40 mg Oral Daily  . rivaroxaban  20 mg Oral Q supper  . zinc sulfate  220 mg Oral Daily   Continuous Infusions: . sodium chloride 250 mL (10/19/19 0622)  . ciprofloxacin 400 mg (10/19/19 1149)  . DAPTOmycin (CUBICIN)  IV Stopped (10/18/19 1857)  . metronidazole 500 mg (10/19/19 0624)   PRN Meds: sodium chloride, acetaminophen **OR** acetaminophen, ipratropium-albuterol, ondansetron (ZOFRAN) IV   Vital Signs    Vitals:   10/18/19 1550 10/18/19 1936 10/19/19 0412 10/19/19 0925  BP: 113/79 91/61 104/69 95/64  Pulse: 89 92 87 100  Resp: 18 18 16    Temp: 98.1 F (36.7 C) 98.1 F (36.7 C) 97.9 F (36.6 C) 97.8 F (36.6 C)  TempSrc:  Oral Oral Oral  SpO2: 100% 100% 100% 100%  Weight:   113.8 kg   Height:        Intake/Output Summary (Last 24 hours) at 10/19/2019 1334 Last data filed at 10/19/2019 0940 Gross per 24 hour  Intake 2080.55 ml  Output 1400 ml  Net 680.55 ml   Filed Weights   10/15/19 0351 10/16/19 1300 10/19/19 0412  Weight: 80.3 kg 111.6 kg 113.8 kg    Telemetry    Atrial fib with a controlled VR - Personally Reviewed  ECG    none - Personally Reviewed  Physical Exam   GEN: No acute distress.   Neck: No  JVD Cardiac: IRRR, no murmurs, rubs, or gallops.  Respiratory: Clear to auscultation bilaterally. GI: Soft, nontender, non-distended  MS: No edema; No deformity. Neuro:  Nonfocal  Psych: Normal affect   Labs    Chemistry Recent Labs  Lab 10/17/19 0514 10/18/19 0423 10/19/19 0616  NA 143 142 141  K 3.4* 3.3* 3.8  CL 102 101 104  CO2 33* 29 28  GLUCOSE 109* 112* 93  BUN 45* 40* 30*  CREATININE 2.54* 2.19* 1.99*  CALCIUM 7.7* 7.6* 7.9*  GFRNONAA 25* 30* 34*  GFRAA 29* 35* 39*  ANIONGAP 8 12 9      Hematology Recent Labs  Lab 10/17/19 0514 10/18/19 0423 10/19/19 0616  WBC 10.1 11.4* 12.8*  RBC 2.61* 2.50* 2.94*  HGB 7.2* 6.7* 8.3*  HCT 22.2* 21.6* 25.3*  MCV 85.1 86.4 86.1  MCH 27.6 26.8 28.2  MCHC 32.4 31.0 32.8  RDW 19.9* 19.7* 18.9*  PLT 68* 120* 155    Cardiac EnzymesNo results for input(s): TROPONINI in the last 168 hours. No results for input(s): TROPIPOC in the last 168 hours.   BNPNo results for input(s): BNP, PROBNP in the last 168 hours.   DDimer No results for  input(s): DDIMER in the last 168 hours.   Radiology    No results found.  Cardiac Studies   none  Patient Profile     69 y.o. male admitted with volume overload, anemia, and sacral decubitus, s/p transfusion.  Assessment & Plan    1. Chronic systolic heart failure - his volume status is stable after transfusion. Continue current meds.  2. Osteomyelitis - continue anti-biotics. 3. Acute on chronic renal insufficiency - creatinine stable.  [facility] will sign off.   Medication Recommendations:  contiue current meds Other recommendations (labs, testing, etc):  none Follow up as an outpatient:  Dr. Rockey Situ  For questions or updates, please contact [facility] Please consult [email or web address] for contact info under Cardiology/STEMI.      Signed, Cristopher Peru, MD  10/19/2019, 1:34 PM  Patient ID: [name], male   DOB: 27-Oct-1950, 69 y.o.   MRN: [id number]

## 2019-10-19 NOTE — Plan of Care (Signed)
  Problem: Education: Goal: Knowledge of General Education information will improve Description: Including pain rating scale, medication(s)/side effects and non-pharmacologic comfort measures Outcome: Progressing   Problem: Health Behavior/Discharge Planning: Goal: Ability to manage health-related needs will improve Outcome: Not Progressing   Problem: Clinical Measurements: Goal: Ability to maintain clinical measurements within normal limits will improve Outcome: Not Progressing Note: Potassium only 3.3 currently. Will continue to monitor lab values for the remainder of the shift. [facility]

## 2019-10-20 LAB — BASIC METABOLIC PANEL
Anion gap: 8 (ref 5–15)
BUN: 29 mg/dL — ABNORMAL HIGH (ref 8–23)
CO2: 28 mmol/L (ref 22–32)
Calcium: 7.9 mg/dL — ABNORMAL LOW (ref 8.9–10.3)
Chloride: 101 mmol/L (ref 98–111)
Creatinine, Ser: 1.89 mg/dL — ABNORMAL HIGH (ref 0.61–1.24)
GFR calc Af Amer: 41 mL/min — ABNORMAL LOW (ref >60)
GFR calc non Af Amer: 36 mL/min — ABNORMAL LOW (ref >60)
Glucose, Bld: 124 mg/dL — ABNORMAL HIGH (ref 70–99)
Potassium: 3.5 mmol/L (ref 3.5–5.1)
Sodium: 137 mmol/L (ref 135–145)

## 2019-10-20 LAB — DIGOXIN LEVEL: Digoxin Level: 0.5 ng/mL — ABNORMAL LOW (ref 0.8–2.0)

## 2019-10-20 LAB — PHOSPHORUS: Phosphorus: 2.7 mg/dL (ref 2.5–4.6)

## 2019-10-20 LAB — MAGNESIUM: Magnesium: 1.6 mg/dL — ABNORMAL LOW (ref 1.7–2.4)

## 2019-10-20 LAB — CK: Total CK: 27 U/L — ABNORMAL LOW (ref 49–397)

## 2019-10-20 MED ORDER — POTASSIUM CHLORIDE CRYS ER 20 MEQ PO TBCR
40.0000 meq | EXTENDED_RELEASE_TABLET | Freq: Once | ORAL | Status: AC
  Administered 2019-10-20: 40 meq via ORAL
  Filled 2019-10-20: qty 2

## 2019-10-20 MED ORDER — MAGNESIUM SULFATE 2 GM/50ML IV SOLN
2.0000 g | Freq: Once | INTRAVENOUS | Status: AC
  Administered 2019-10-20: 2 g via INTRAVENOUS
  Filled 2019-10-20: qty 50

## 2019-10-20 MED ORDER — DIGOXIN 125 MCG PO TABS
0.0625 mg | ORAL_TABLET | Freq: Every day | ORAL | Status: DC
  Administered 2019-10-21 – 2019-10-23 (×3): 0.0625 mg via ORAL
  Filled 2019-10-20 (×4): qty 0.5

## 2019-10-20 NOTE — Plan of Care (Signed)
  Problem: Education: Goal: Knowledge of General Education information will improve Description: Including pain rating scale, medication(s)/side effects and non-pharmacologic comfort measures Outcome: Progressing   Problem: Clinical Measurements: Goal: Will remain free from infection Outcome: Progressing   Problem: Safety: Goal: Ability to remain free from injury will improve Outcome: Progressing

## 2019-10-20 NOTE — Progress Notes (Addendum)
Pt PICC line dressing was change and also his sacral foam dressing.  Update [phone]: CCMD called and reported that pt converted to Afib rate at 130 for 5 seconds then went down to 122. Pt was stable and so VSS except BP 99/73  And HR 120. Notify prime and talked to [facility] but states to monitor at this time. Will continue to monitor.

## 2019-10-20 NOTE — Progress Notes (Addendum)
Progress Note    Colton Lamb  BTD:[id number] DOB: October 01, 1950  DOA: 10/11/2019 PCP: [name], MD      Brief Narrative:    Medical records reviewed and are as summarized below:  Colton Lamb is an 69 y.o. male       Assessment/Plan:   Principal Problem:   Acute renal failure (ARF) ([place]) Active Problems:   Hyperlipidemia   CAD S/P percutaneous coronary angioplasty - multiple PCIs   Atrial fibrillation ([place])   GERD   CROHN'S DISEASE-LARGE & SMALL INTESTINE   Hypertension, essential   Chronic systolic CHF (congestive heart failure) (HCC)   Normocytic anemia   Chronic osteomyelitis ([place])   Colostomy in place [facility])   Bacteremia due to Pseudomonas   Diarrhea   COVID-19 virus infection   Thrombocytopenia ([place])   Sacral decubitus ulcer, stage IV ([place])   Acute renal failure:Creatinine is slowly trending down. Baseline around 0.82. Likely due to prerenal secondary to dehydration.  Avoid nephrotoxics   Follow-up with nephrologist.  Monitor BMP  Diarrhea: Resolved.  C. diff PCR negative. GI path panel completely neg.   Hypokalemia, hypophosphatemia and hypomagnesemia: Improved.  Replete magnesium and potassium  Hyperlipidemia: continue on statin   CAD: s/p multiple PCIs. Continue w/ aspirin and lipitor  Atrial fibrillation w/ RVR. PAF vs chronic.  Continue digoxin.  Resume Xarelto because of high risk for stroke.  Cardiologist has signed off.  Hypotension: Overall, BP has improved but BP still borderline low. This makes use of rate controlling drugs difficult.  Monitor BP closely.  GERD: continue on PPI   Crohns Disease: continue balsalazide. S/p colostomy  Chronic systolic TGG:YIRSWNIOEVO.  Echo on 09/28/2019 showed EF 25-30%.  Normocytic anemia & acute blood loss anemia/ severe anemia: H&H improved.  s/p transfusion with 2 units of prbc on 10/18/2019.  S/p 1 unit PRBCs 10/13/19.   Monitor H&H  Chronic osteomyelitis & Pseudomonas  bacteremia: Sacral decubitus wound grew  Pseudomonas, VRE and Proteus in February 2021.  Continue current antibiotics.  Follow-up with ID.  Completed linezolid course 10/14/19. Repeat blood cx NGTD  Recommendations from ID are as follows:  "Stop date for the cipro flagyl and dapto is March 22.  If dced prior to this date can send on just cipro and flaygl as dapto unlikely to be covered. Will need aggressive wound care.  DC picc prior to discharge"  COVID-19 virus infection: asymptomatic  Thrombocytopenia: Resolved.  Hematologist has signed off.  Sacral decubitus ulcer, stage IV: present on admission. continue local wound care and wound VAC therapy.    Generalized weakness: PT and OT recommend SNF at discharge  Failure to thrive: lives at home alone. Cannot take care of himself at home and pt agrees with this.  Patient was not interested in palliative care.   Body mass index is 34.75 kg/m.  (Obesity)   Family Communication/Anticipated D/C date and plan/Code Status   DVT prophylaxis:  Resume Xarelto Code Status: Full code Family Communication: Discussed with patient.  Disposition Plan: Patient is from SNF.  Plan to discharge to SNF tomorrow after completion of antibiotics and if discharge to SNF can be arranged.  Follow-up with social worker to assist with disposition.      Subjective:   No complaints.  No shortness of breath or chest pain.  Objective:    Vitals:   10/19/19 1959 10/20/19 0518 10/20/19 0652 10/20/19 0700  BP: 114/73 110/75 99/73 109/75  Pulse: 91 90 (!) 130  Resp: 20 20    Temp: 98.5 F (36.9 C) 98 F (36.7 C) 98 F (36.7 C)   TempSrc: Oral Oral    SpO2: 100% 100% 98%   Weight:  113 kg    Height:        Intake/Output Summary (Last 24 hours) at 10/20/2019 1221 Last data filed at 10/20/2019 0933 Gross per 24 hour  Intake 300 ml  Output 3050 ml  Net -2750 ml   Filed Weights   10/16/19 1300 10/19/19 0412 10/20/19 0518  Weight: 111.6 kg 113.8  kg 113 kg    Exam:  GEN: No acute distress  SKIN: Stage IV sacral decubitus ulcer connected to wound VAC EYES: No abnormality noted ENT: MMM CV: Irregular rate and rhythm,  PULM: No wheezing or rales heard ABD: soft, ND, NT, +BS, some redness around colostomy bag.  Colostomy bag has yellowish-brown stools. CNS: AAO x 3 EXT: No edema or tenderness GU: Foley catheter draining amber urine   Data Reviewed:   I have personally reviewed following labs and imaging studies:  Labs: Labs show the following:   Basic Metabolic Panel: Recent Labs  Lab 10/16/19 0618 10/16/19 0618 10/17/19 0514 10/17/19 0514 10/18/19 0423 10/18/19 0423 10/19/19 0616 10/20/19 0432  NA 144  --  143  --  142  --  141 137  K 3.0*   < > 3.4*   < > 3.3*   < > 3.8 3.5  CL 100  --  102  --  101  --  104 101  CO2 34*  --  33*  --  29  --  28 28  GLUCOSE 100*  --  109*  --  112*  --  93 124*  BUN 55*  --  45*  --  40*  --  30* 29*  CREATININE 2.92*  --  2.54*  --  2.19*  --  1.99* 1.89*  CALCIUM 7.4*  --  7.7*  --  7.6*  --  7.9* 7.9*  MG 1.3*  --  1.5*  --  1.6*  --  1.7 1.6*  PHOS  --   --  1.2*  --  1.6*  --  2.8 2.7   < > = values in this interval not displayed.   GFR Estimated Creatinine Clearance: 47.8 mL/min (A) (by C-G formula based on SCr of 1.89 mg/dL (H)). Liver Function Tests: No results for input(s): AST, ALT, ALKPHOS, BILITOT, PROT, ALBUMIN in the last 168 hours. No results for input(s): LIPASE, AMYLASE in the last 168 hours. No results for input(s): AMMONIA in the last 168 hours. Coagulation profile No results for input(s): INR, PROTIME in the last 168 hours.  CBC: Recent Labs  Lab 10/15/19 0433 10/16/19 0618 10/17/19 0514 10/18/19 0423 10/19/19 0616  WBC 10.6* 10.7* 10.1 11.4* 12.8*  NEUTROABS  --   --  7.3 7.7 8.7*  HGB 7.3* 7.4* 7.2* 6.7* 8.3*  HCT 23.1* 23.5* 22.2* 21.6* 25.3*  MCV 87.8 87.0 85.1 86.4 86.1  PLT 36* 45* 68* 120* 155   Cardiac Enzymes: Recent Labs  Lab  10/17/19 0514 10/20/19 0432  CKTOTAL 22* 27*   BNP (last 3 results) No results for input(s): PROBNP in the last 8760 hours. CBG: No results for input(s): GLUCAP in the last 168 hours. D-Dimer: No results for input(s): DDIMER in the last 72 hours. Hgb A1c: No results for input(s): HGBA1C in the last 72 hours. Lipid Profile: No results for input(s): CHOL, HDL, LDLCALC, TRIG,  CHOLHDL, LDLDIRECT in the last 72 hours. Thyroid function studies: No results for input(s): TSH, T4TOTAL, T3FREE, THYROIDAB in the last 72 hours.  Invalid input(s): FREET3 Anemia work up: No results for input(s): VITAMINB12, FOLATE, FERRITIN, TIBC, IRON, RETICCTPCT in the last 72 hours. Sepsis Labs: Recent Labs  Lab 10/16/19 0618 10/17/19 0514 10/18/19 0423 10/19/19 0616  WBC 10.7* 10.1 11.4* 12.8*    Microbiology Recent Results (from the past 240 hour(s))  GI pathogen panel by PCR, stool     Status: None   Collection Time: 10/11/19  9:33 AM   Specimen: STOOL  Result Value Ref Range Status   Plesiomonas shigelloides NOT DETECTED NOT DETECTED Final   Yersinia enterocolitica NOT DETECTED NOT DETECTED Final   Vibrio NOT DETECTED NOT DETECTED Final   Enteropathogenic E coli NOT DETECTED NOT DETECTED Final   E coli (ETEC) LT/ST NOT DETECTED NOT DETECTED Final   E coli 9242 by PCR Not applicable NOT DETECTED Final   Cryptosporidium by PCR NOT DETECTED NOT DETECTED Final   Entamoeba histolytica NOT DETECTED NOT DETECTED Final   Adenovirus F 40/41 NOT DETECTED NOT DETECTED Final   Norovirus GI/GII NOT DETECTED NOT DETECTED Final   Sapovirus NOT DETECTED NOT DETECTED Final    Comment: (NOTE) Performed At: [facility] [place], [place] [id number] Rush Farmer MD WL:[id number]    Vibrio cholerae NOT DETECTED NOT DETECTED Final   Campylobacter by PCR NOT DETECTED NOT DETECTED Final   Salmonella by PCR NOT DETECTED NOT DETECTED Final   E coli (STEC) NOT DETECTED NOT DETECTED Final    Enteroaggregative E coli NOT DETECTED NOT DETECTED Final   Shigella by PCR NOT DETECTED NOT DETECTED Final   Cyclospora cayetanensis NOT DETECTED NOT DETECTED Final   Astrovirus NOT DETECTED NOT DETECTED Final   G lamblia by PCR NOT DETECTED NOT DETECTED Final   Rotavirus A by PCR NOT DETECTED NOT DETECTED Final  C Difficile Quick Screen w PCR reflex     Status: None   Collection Time: 10/11/19  9:33 AM   Specimen: STOOL  Result Value Ref Range Status   C Diff antigen NEGATIVE NEGATIVE Final   C Diff toxin NEGATIVE NEGATIVE Final   C Diff interpretation No C. difficile detected.  Final    Comment: Performed at [facility], [address] [place], [place] [address]  Culture, blood (Routine X 2) w Reflex to ID Panel     Status: None   Collection Time: 10/11/19  4:34 PM   Specimen: BLOOD RIGHT HAND  Result Value Ref Range Status   Specimen Description BLOOD RIGHT HAND  Final   Special Requests   Final    BOTTLES DRAWN AEROBIC ONLY Blood Culture results may not be optimal due to an inadequate volume of blood received in culture bottles   Culture   Final    NO GROWTH 5 DAYS Performed at [facility], [address], [place], [place] [address]    Report Status 10/16/2019 FINAL  Final    Procedures and diagnostic studies:  No results found.  Medications:   . ascorbic acid  500 mg Oral BID  . atorvastatin  20 mg Oral Daily  . balsalazide  750 mg Oral TID  . Chlorhexidine Gluconate Cloth  6 each Topical Daily  . collagenase  1 application Topical Daily  . [START ON 10/21/2019] digoxin  0.0625 mg Oral Daily  . feeding supplement (NEPRO CARB STEADY)  237 mL Oral TID BM  .  finasteride  5 mg Oral Daily  . influenza vaccine adjuvanted  0.5 mL Intramuscular Tomorrow-1000  . lactobacillus  1 g Oral TID WC  . loperamide  2 mg Oral Q8H  . midodrine  10 mg Oral TID  . multivitamin with minerals  1 tablet Oral Daily  . pantoprazole  40 mg Oral Daily  . rivaroxaban   20 mg Oral Q supper  . sodium chloride flush  10-40 mL Intracatheter Q12H  . zinc sulfate  220 mg Oral Daily   Continuous Infusions: . sodium chloride 500 mL (10/20/19 0616)  . ciprofloxacin 400 mg (10/20/19 1203)  . DAPTOmycin (CUBICIN)  IV 800 mg (10/19/19 1657)  . metronidazole 500 mg (10/20/19 0619)     LOS: 9 days   Pansie Guggisberg  [facility]     10/20/2019, 12:21 PM

## 2019-10-20 NOTE — Progress Notes (Signed)
[facility]  ROUNDING NOTE   Subjective:   Patient denies any acute complaints States he feels weak in his legs Denies any nausea or vomiting  Objective:  Vital signs in last 24 hours:  Temp:  [98 F (36.7 C)-98.5 F (36.9 C)] 98 F (36.7 C) (03/21 0652) Pulse Rate:  [90-130] 130 (03/21 0652) Resp:  [20] 20 (03/21 0518) BP: (99-114)/(73-75) 109/75 (03/21 0700) SpO2:  [98 %-100 %] 98 % (03/21 0652) Weight:  [254 kg] 113 kg (03/21 0518)  Weight change: -0.81 kg Filed Weights   10/16/19 1300 10/19/19 0412 10/20/19 0518  Weight: 111.6 kg 113.8 kg 113 kg    Intake/Output: I/O last 3 completed shifts: In: 44 [P.O.:480; IV Piggyback:300] Out: 3300 [Urine:2150; Stool:1150]   Intake/Output this shift:  Total I/O In: -  Out: 500 [Urine:500]  Physical Exam: General: NAD, laying in bed  Head: Normocephalic, atraumatic. Moist oral mucosal membranes  Eyes: Anicteric,   Lungs:  Clear to auscultation  Heart: Regular rate and rhythm  Abdomen:  Soft, nontender,   Extremities:  no peripheral edema.  Neurologic:  Alert, able to answer simple questions   Foley catheter in place    Basic Metabolic Panel: Recent Labs  Lab 10/16/19 0618 10/16/19 0618 10/17/19 0514 10/17/19 0514 10/18/19 0423 10/19/19 0616 10/20/19 0432  NA 144  --  143  --  142 141 137  K 3.0*  --  3.4*  --  3.3* 3.8 3.5  CL 100  --  102  --  101 104 101  CO2 34*  --  33*  --  29 28 28   GLUCOSE 100*  --  109*  --  112* 93 124*  BUN 55*  --  45*  --  40* 30* 29*  CREATININE 2.92*  --  2.54*  --  2.19* 1.99* 1.89*  CALCIUM 7.4*   < > 7.7*   < > 7.6* 7.9* 7.9*  MG 1.3*  --  1.5*  --  1.6* 1.7 1.6*  PHOS  --   --  1.2*  --  1.6* 2.8 2.7   < > = values in this interval not displayed.    Liver Function Tests: No results for input(s): AST, ALT, ALKPHOS, BILITOT, PROT, ALBUMIN in the last 168 hours. No results for input(s): LIPASE, AMYLASE in the last 168 hours. No results for input(s):  AMMONIA in the last 168 hours.  CBC: Recent Labs  Lab 10/15/19 0433 10/16/19 0618 10/17/19 0514 10/18/19 0423 10/19/19 0616  WBC 10.6* 10.7* 10.1 11.4* 12.8*  NEUTROABS  --   --  7.3 7.7 8.7*  HGB 7.3* 7.4* 7.2* 6.7* 8.3*  HCT 23.1* 23.5* 22.2* 21.6* 25.3*  MCV 87.8 87.0 85.1 86.4 86.1  PLT 36* 45* 68* 120* 155    Cardiac Enzymes: Recent Labs  Lab 10/17/19 0514 10/20/19 0432  CKTOTAL 22* 27*    BNP: Invalid input(s): POCBNP  CBG: No results for input(s): GLUCAP in the last 168 hours.  Microbiology: Results for orders placed or performed during the hospital encounter of 10/11/19  GI pathogen panel by PCR, stool     Status: None   Collection Time: 10/11/19  9:33 AM   Specimen: STOOL  Result Value Ref Range Status   Plesiomonas shigelloides NOT DETECTED NOT DETECTED Final   Yersinia enterocolitica NOT DETECTED NOT DETECTED Final   Vibrio NOT DETECTED NOT DETECTED Final   Enteropathogenic E coli NOT DETECTED NOT DETECTED Final   E coli (ETEC) LT/ST NOT  DETECTED NOT DETECTED Final   E coli 9767 by PCR Not applicable NOT DETECTED Final   Cryptosporidium by PCR NOT DETECTED NOT DETECTED Final   Entamoeba histolytica NOT DETECTED NOT DETECTED Final   Adenovirus F 40/41 NOT DETECTED NOT DETECTED Final   Norovirus GI/GII NOT DETECTED NOT DETECTED Final   Sapovirus NOT DETECTED NOT DETECTED Final    Comment: (NOTE) Performed At: [facility] [place], [place] [id number] Rush Farmer MD IO:[id number]    Vibrio cholerae NOT DETECTED NOT DETECTED Final   Campylobacter by PCR NOT DETECTED NOT DETECTED Final   Salmonella by PCR NOT DETECTED NOT DETECTED Final   E coli (STEC) NOT DETECTED NOT DETECTED Final   Enteroaggregative E coli NOT DETECTED NOT DETECTED Final   Shigella by PCR NOT DETECTED NOT DETECTED Final   Cyclospora cayetanensis NOT DETECTED NOT DETECTED Final   Astrovirus NOT DETECTED NOT DETECTED Final   G lamblia by PCR NOT DETECTED  NOT DETECTED Final   Rotavirus A by PCR NOT DETECTED NOT DETECTED Final  C Difficile Quick Screen w PCR reflex     Status: None   Collection Time: 10/11/19  9:33 AM   Specimen: STOOL  Result Value Ref Range Status   C Diff antigen NEGATIVE NEGATIVE Final   C Diff toxin NEGATIVE NEGATIVE Final   C Diff interpretation No C. difficile detected.  Final    Comment: Performed at [facility], [address] [place], [place] [address]  Culture, blood (Routine X 2) w Reflex to ID Panel     Status: None   Collection Time: 10/11/19  4:34 PM   Specimen: BLOOD RIGHT HAND  Result Value Ref Range Status   Specimen Description BLOOD RIGHT HAND  Final   Special Requests   Final    BOTTLES DRAWN AEROBIC ONLY Blood Culture results may not be optimal due to an inadequate volume of blood received in culture bottles   Culture   Final    NO GROWTH 5 DAYS Performed at [facility], [address], [place], [place] [address]    Report Status 10/16/2019 FINAL  Final    Coagulation Studies: No results for input(s): LABPROT, INR in the last 72 hours.  Urinalysis: No results for input(s): COLORURINE, LABSPEC, PHURINE, GLUCOSEU, HGBUR, BILIRUBINUR, KETONESUR, PROTEINUR, UROBILINOGEN, NITRITE, LEUKOCYTESUR in the last 72 hours.  Invalid input(s): APPERANCEUR    Imaging: No results found.   Medications:   . sodium chloride 500 mL (10/20/19 0616)  . ciprofloxacin 400 mg (10/20/19 1203)  . DAPTOmycin (CUBICIN)  IV 800 mg (10/19/19 1657)  . metronidazole 500 mg (10/20/19 2979)   . ascorbic acid  500 mg Oral BID  . atorvastatin  20 mg Oral Daily  . balsalazide  750 mg Oral TID  . Chlorhexidine Gluconate Cloth  6 each Topical Daily  . collagenase  1 application Topical Daily  . [START ON 10/21/2019] digoxin  0.0625 mg Oral Daily  . feeding supplement (NEPRO CARB STEADY)  237 mL Oral TID BM  . finasteride  5 mg Oral Daily  . influenza vaccine adjuvanted  0.5 mL Intramuscular  Tomorrow-1000  . lactobacillus  1 g Oral TID WC  . loperamide  2 mg Oral Q8H  . midodrine  10 mg Oral TID  . multivitamin with minerals  1 tablet Oral Daily  . pantoprazole  40 mg Oral Daily  . rivaroxaban  20 mg Oral Q supper  . sodium chloride flush  10-40  mL Intracatheter Q12H  . zinc sulfate  220 mg Oral Daily   sodium chloride, acetaminophen **OR** acetaminophen, ipratropium-albuterol, ondansetron (ZOFRAN) IV, sodium chloride flush  Assessment/ Plan:  [name] is a 69 y.o. white male  with diabetes mellitus type II, hypertension, obstructive sleep apnea, congestive heart failure, atrial fibrillation, hyperlipidemia, Crohn's disease, coronary artery disease, GERD, pacemaker,who was admitted to [facility] on 10/11/2019 for Diarrhea of infectious origin [A09] AKI (acute kidney injury) ([place]) [N17.9]  1. Acute renal failure with metabolic acidosis: with baseline creatinine of 0.82, GFR > 60 on 10/01/19.  with underlying diabetic nephropathy with glucosuria and proteinuria.  Renal ultrasound negative. No IV contrast exposure.  AKI likely due to ATN secondary to concurrent infections Serum creatinine improving slowly Urine output 1400 cc  2.  Hypokalemia Replace potassium as needed  3. Anemia with renal failure and thrombocytopenia:  Lab Results  Component Value Date   HGB 8.3 (L) 10/19/2019   Status post PRBC transfusion on 3/15. Scheduled for PRBC transfusion today.   4. Bacteremia with sepsis: pseudomonas, enterococcus and proteus on bone culture 09/26/19.  - Appreciate ID input. Cipro, metronidazole and daptomycin.   5. Covid-19 positive recently September 23, 2019    LOS: 9 Colton Lamb 3/21/202112:10 PM

## 2019-10-21 LAB — CBC WITH DIFFERENTIAL/PLATELET
Abs Immature Granulocytes: 0.63 10*3/uL — ABNORMAL HIGH (ref 0.00–0.07)
Basophils Absolute: 0.1 10*3/uL (ref 0.0–0.1)
Basophils Relative: 1 %
Eosinophils Absolute: 0.4 10*3/uL (ref 0.0–0.5)
Eosinophils Relative: 3 %
HCT: 24.7 % — ABNORMAL LOW (ref 39.0–52.0)
Hemoglobin: 8 g/dL — ABNORMAL LOW (ref 13.0–17.0)
Immature Granulocytes: 5 %
Lymphocytes Relative: 13 %
Lymphs Abs: 1.7 10*3/uL (ref 0.7–4.0)
MCH: 28.1 pg (ref 26.0–34.0)
MCHC: 32.4 g/dL (ref 30.0–36.0)
MCV: 86.7 fL (ref 80.0–100.0)
Monocytes Absolute: 1.7 10*3/uL — ABNORMAL HIGH (ref 0.1–1.0)
Monocytes Relative: 13 %
Neutro Abs: 8.7 10*3/uL — ABNORMAL HIGH (ref 1.7–7.7)
Neutrophils Relative %: 65 %
Platelets: 252 10*3/uL (ref 150–400)
RBC: 2.85 MIL/uL — ABNORMAL LOW (ref 4.22–5.81)
RDW: 19.2 % — ABNORMAL HIGH (ref 11.5–15.5)
WBC: 13.2 10*3/uL — ABNORMAL HIGH (ref 4.0–10.5)
nRBC: 0 % (ref 0.0–0.2)

## 2019-10-21 LAB — BASIC METABOLIC PANEL
Anion gap: 8 (ref 5–15)
BUN: 29 mg/dL — ABNORMAL HIGH (ref 8–23)
CO2: 27 mmol/L (ref 22–32)
Calcium: 8 mg/dL — ABNORMAL LOW (ref 8.9–10.3)
Chloride: 105 mmol/L (ref 98–111)
Creatinine, Ser: 1.82 mg/dL — ABNORMAL HIGH (ref 0.61–1.24)
GFR calc Af Amer: 43 mL/min — ABNORMAL LOW (ref >60)
GFR calc non Af Amer: 37 mL/min — ABNORMAL LOW (ref >60)
Glucose, Bld: 122 mg/dL — ABNORMAL HIGH (ref 70–99)
Potassium: 3.8 mmol/L (ref 3.5–5.1)
Sodium: 140 mmol/L (ref 135–145)

## 2019-10-21 LAB — MAGNESIUM: Magnesium: 1.8 mg/dL (ref 1.7–2.4)

## 2019-10-21 MED ORDER — FERROUS GLUCONATE 324 (38 FE) MG PO TABS
324.0000 mg | ORAL_TABLET | Freq: Every day | ORAL | Status: DC
  Administered 2019-10-22 – 2019-10-23 (×2): 324 mg via ORAL
  Filled 2019-10-21 (×3): qty 1

## 2019-10-21 MED ORDER — MAGNESIUM OXIDE 400 (241.3 MG) MG PO TABS
400.0000 mg | ORAL_TABLET | Freq: Two times a day (BID) | ORAL | Status: DC
  Administered 2019-10-21 – 2019-10-23 (×5): 400 mg via ORAL
  Filled 2019-10-21 (×5): qty 1

## 2019-10-21 MED ORDER — ATORVASTATIN CALCIUM 80 MG PO TABS
80.0000 mg | ORAL_TABLET | Freq: Every day | ORAL | Status: DC
  Administered 2019-10-23: 80 mg via ORAL
  Filled 2019-10-21: qty 1

## 2019-10-21 MED ORDER — POTASSIUM CHLORIDE CRYS ER 20 MEQ PO TBCR
20.0000 meq | EXTENDED_RELEASE_TABLET | Freq: Every day | ORAL | Status: AC
  Administered 2019-10-21 – 2019-10-22 (×2): 20 meq via ORAL
  Filled 2019-10-21 (×2): qty 1

## 2019-10-21 NOTE — TOC Progression Note (Signed)
Transition of Care [facility]) - Progression Note    Patient Details  Name: CLAYVON PARLETT MRN: [id number] Date of Birth: Dec 14, 1950  Transition of Care [facility]) CM/SW Contact  Eileen Stanford, LCSW Phone Number: 10/21/2019, 2:24 PM  Clinical Narrative:   CSW spoke with pt at bedside. [facility] has offered pt a bed however, it would be $8,550 total upfront--Facility requires pt who is private paying to pay first 30 days upfront. Pt was blown away by the price. Pt states "that is ridiculous." Pt was upset. Pt states "let me think about it." CSW will follow up with pt. That is our only offer at this time.    Expected Discharge Plan: [place] Barriers to Discharge: Continued Medical Work up  Expected Discharge Plan and Services Expected Discharge Plan: [place] In-house Referral: Clinical Social Work   Post Acute Care Choice: [place] Living arrangements for the past 2 months: [place] Determinants of Health (SDOH) Interventions    Readmission Risk Interventions Readmission Risk Prevention Plan 10/14/2019 10/14/2019 07/18/2019  Transportation Screening Complete Complete Complete  Social Work Consult for [place] - - -  Medication Review Press photographer) Complete Complete Complete  PCP or Specialist appointment within 3-5 days of discharge Complete Complete (No Data)  [place] or [place] Complete Complete -  [place] or Home Care Consult Pt Refusal Comments - - return to SNF  SW Recovery Care/Counseling Consult Complete Complete -  Palliative Care Screening Not Applicable Not Applicable -  [place] Not Applicable Not Applicable Complete  Some recent data might be hidden

## 2019-10-21 NOTE — Progress Notes (Signed)
[facility] INFECTIOUS DISEASE PROGRESS NOTE Date of Admission:  10/11/2019     ID: Colton Lamb is a 69 y.o. male with Pseudomonal bacteremia and chronic sacral ulcer Principal Problem:   Acute renal failure (ARF) ([place]) Active Problems:   Hyperlipidemia   CAD S/P percutaneous coronary angioplasty - multiple PCIs   Atrial fibrillation (HCC)   GERD   CROHN'S DISEASE-LARGE & SMALL INTESTINE   Hypertension, essential   Chronic systolic CHF (congestive heart failure) (HCC)   Normocytic anemia   Chronic osteomyelitis ([place])   Colostomy in place [facility])   Bacteremia due to Pseudomonas   Diarrhea   COVID-19 virus infection   Thrombocytopenia (HCC)   Sacral decubitus ulcer, stage IV ([place])   Subjective: 3/19 - no fevers. Diarrhea better. [place] neg.  Getting blood transfusion 3/18 - no fevers, wound stable per RN. Denies diarrhea  Patient previously followed while hospitalized.  Now readmitted with acute renal failure and diarrhea.  He had been discharged on linezolid and Zosyn.  On admission he had slightly elevated white count but new thrombocytopenia.  The thrombocytopenia has worsened.  Yesterday when I was called regarding this we advised stop the linezolid and Zosyn as possible causes of the thrombocytopenia.  I started him on ciprofloxacin and Flagyl.  He is seen this morning.  He reports that his diarrhea is resolved.  His tells me that his wound is improving.  He says he was readmitted because he could not be cared for at home but is not sure of the ongoing plan.  ROS  Eleven systems are reviewed and negative except per hpi  Medications:  Antibiotics Given (last 72 hours)    Date/Time Action Medication Dose Rate   10/18/19 1827 New Bag/Given   DAPTOmycin (CUBICIN) 800 mg in sodium chloride 0.9 % IVPB 800 mg 232 mL/hr   10/18/19 2029 New Bag/Given   ciprofloxacin (CIPRO) IVPB 400 mg 400 mg 200 mL/hr   10/18/19 2208 New Bag/Given   metroNIDAZOLE (FLAGYL) IVPB 500 mg 500 mg 100  mL/hr   10/19/19 0102 New Bag/Given   metroNIDAZOLE (FLAGYL) IVPB 500 mg 500 mg 100 mL/hr   10/19/19 1149 New Bag/Given   ciprofloxacin (CIPRO) IVPB 400 mg 400 mg 200 mL/hr   10/19/19 1516 New Bag/Given   metroNIDAZOLE (FLAGYL) IVPB 500 mg 500 mg 100 mL/hr   10/19/19 1657 New Bag/Given   DAPTOmycin (CUBICIN) 800 mg in sodium chloride 0.9 % IVPB 800 mg 232 mL/hr   10/19/19 2123 New Bag/Given   ciprofloxacin (CIPRO) IVPB 400 mg 400 mg 200 mL/hr   10/19/19 2333 New Bag/Given   metroNIDAZOLE (FLAGYL) IVPB 500 mg 500 mg 100 mL/hr   10/20/19 7253 New Bag/Given   metroNIDAZOLE (FLAGYL) IVPB 500 mg 500 mg 100 mL/hr   10/20/19 1203 New Bag/Given   ciprofloxacin (CIPRO) IVPB 400 mg 400 mg 200 mL/hr   10/20/19 1746 New Bag/Given   metroNIDAZOLE (FLAGYL) IVPB 500 mg 500 mg 100 mL/hr   10/20/19 2031 New Bag/Given   DAPTOmycin (CUBICIN) 800 mg in sodium chloride 0.9 % IVPB 800 mg 232 mL/hr   10/20/19 2116 New Bag/Given   ciprofloxacin (CIPRO) IVPB 400 mg 400 mg 200 mL/hr   10/20/19 2325 New Bag/Given   metroNIDAZOLE (FLAGYL) IVPB 500 mg 500 mg 100 mL/hr   10/21/19 0626 New Bag/Given   metroNIDAZOLE (FLAGYL) IVPB 500 mg 500 mg 100 mL/hr   10/21/19 0944 New Bag/Given   ciprofloxacin (CIPRO) IVPB 400 mg 400 mg 200 mL/hr  10/21/19 1456 New Bag/Given   metroNIDAZOLE (FLAGYL) IVPB 500 mg 500 mg 100 mL/hr     . ascorbic acid  500 mg Oral BID  . atorvastatin  20 mg Oral Daily  . balsalazide  750 mg Oral TID  . Chlorhexidine Gluconate Cloth  6 each Topical Daily  . collagenase  1 application Topical Daily  . digoxin  0.0625 mg Oral Daily  . feeding supplement (NEPRO CARB STEADY)  237 mL Oral TID BM  . [START ON 10/22/2019] ferrous gluconate  324 mg Oral Q breakfast  . finasteride  5 mg Oral Daily  . influenza vaccine adjuvanted  0.5 mL Intramuscular Tomorrow-1000  . lactobacillus  1 g Oral TID WC  . loperamide  2 mg Oral Q8H  . magnesium oxide  400 mg Oral BID  . midodrine  10 mg Oral TID  .  multivitamin with minerals  1 tablet Oral Daily  . pantoprazole  40 mg Oral Daily  . potassium chloride  20 mEq Oral Daily  . rivaroxaban  20 mg Oral Q supper  . sodium chloride flush  10-40 mL Intracatheter Q12H  . zinc sulfate  220 mg Oral Daily    Objective: Vital signs in last 24 hours: Temp:  [97.6 F (36.4 C)-98.3 F (36.8 C)] 97.9 F (36.6 C) (03/22 1148) Pulse Rate:  [85-94] 90 (03/22 1148) Resp:  [16-20] 18 (03/22 1148) BP: (98-108)/(64-73) 108/73 (03/22 1148) SpO2:  [99 %-100 %] 100 % (03/22 1148) Weight:  [891 kg] 112 kg (03/22 0257) Physical Exam  Constitutional: chronically ill appearing HENT: anicteric Mouth/Throat: Oropharynx is clear and moist. No oropharyngeal exudate.  Cardiovascular: Normal rate, regular rhythm and normal heart sounds. Pulmonary/Chest: Effort normal and breath sounds normal. No respiratory distress. He has no wheezes.  Abdominal: Soft. Bowel sounds are normal. He exhibits no distension. There is no tenderness. Colostomy in place Lymphadenopathy:  He has no cervical adenopathy.  Neurological: He is alert and oriented to person, place, and time.  Integumentary: no exposed bone, some thin exudate. Mild ttp    Lab Results Recent Labs    10/19/19 0616 10/19/19 0616 10/20/19 0432 10/21/19 0519  WBC 12.8*  --   --  13.2*  HGB 8.3*  --   --  8.0*  HCT 25.3*  --   --  24.7*  NA 141   < > 137 140  K 3.8   < > 3.5 3.8  CL 104   < > 101 105  CO2 28   < > 28 27  BUN 30*   < > 29* 29*  CREATININE 1.99*   < > 1.89* 1.82*   < > = values in this interval not displayed.    Microbiology: Results for orders placed or performed during the hospital encounter of 10/11/19  GI pathogen panel by PCR, stool     Status: None   Collection Time: 10/11/19  9:33 AM   Specimen: STOOL  Result Value Ref Range Status   Plesiomonas shigelloides NOT DETECTED NOT DETECTED Final   Yersinia enterocolitica NOT DETECTED NOT DETECTED Final   Vibrio NOT DETECTED NOT  DETECTED Final   Enteropathogenic E coli NOT DETECTED NOT DETECTED Final   E coli (ETEC) LT/ST NOT DETECTED NOT DETECTED Final   E coli 6945 by PCR Not applicable NOT DETECTED Final   Cryptosporidium by PCR NOT DETECTED NOT DETECTED Final   Entamoeba histolytica NOT DETECTED NOT DETECTED Final   Adenovirus F 40/41 NOT DETECTED NOT DETECTED  Final   Norovirus GI/GII NOT DETECTED NOT DETECTED Final   Sapovirus NOT DETECTED NOT DETECTED Final    Comment: (NOTE) Performed At: [facility] [place], [place] [id number] Rush Farmer MD PP:[id number]    Vibrio cholerae NOT DETECTED NOT DETECTED Final   Campylobacter by PCR NOT DETECTED NOT DETECTED Final   Salmonella by PCR NOT DETECTED NOT DETECTED Final   E coli (STEC) NOT DETECTED NOT DETECTED Final   Enteroaggregative E coli NOT DETECTED NOT DETECTED Final   Shigella by PCR NOT DETECTED NOT DETECTED Final   Cyclospora cayetanensis NOT DETECTED NOT DETECTED Final   Astrovirus NOT DETECTED NOT DETECTED Final   G lamblia by PCR NOT DETECTED NOT DETECTED Final   Rotavirus A by PCR NOT DETECTED NOT DETECTED Final  C Difficile Quick Screen w PCR reflex     Status: None   Collection Time: 10/11/19  9:33 AM   Specimen: STOOL  Result Value Ref Range Status   C Diff antigen NEGATIVE NEGATIVE Final   C Diff toxin NEGATIVE NEGATIVE Final   C Diff interpretation No C. difficile detected.  Final    Comment: Performed at [facility], [address] [place], [place] [address]  Culture, blood (Routine X 2) w Reflex to ID Panel     Status: None   Collection Time: 10/11/19  4:34 PM   Specimen: BLOOD RIGHT HAND  Result Value Ref Range Status   Specimen Description BLOOD RIGHT HAND  Final   Special Requests   Final    BOTTLES DRAWN AEROBIC ONLY Blood Culture results may not be optimal due to an inadequate volume of blood received in culture bottles   Culture   Final    NO GROWTH 5 DAYS Performed at [facility], [address], [place], [place] [address]    Report Status 10/16/2019 FINAL  Final    Studies/Results: Left ventricular ejection fraction, by estimation, is 25 to 30%. The left ventricle has severely decreased function. The left ventricle demonstrates regional wall motion abnormalities , anterior , anterospetal and apical wall hypokinesis. 2. Right ventricular systolic function is normal. The right ventricular size is normal. There is mildly elevated pulmonary artery systolic pressure. 3. Left atrial size was mildly dilated. 4. No vegetation noted. IMPRESSION: Large presacral decubitus ulcer with progressive bone destruction of the distal sacrum and coccyx consistent with osteomyelitis. Chronic nonspecific sclerosis within S1 segment of sacrum. Prostatic enlargement with mild bladder wall thickening which could represent muscular hypertrophy from chronic bladder outlet obstruction or from chronic cystitis. Nonobstructing 4 mm calculus distal LEFT ureter. Double-barrel colostomy LEFT lower quadrant.  Assessment/Plan: 69 year old gentleman is largely bedbound,Has hypertension, hyperlipidemia, diabetes, GERD, depression, proximal A. fib, obstructive sleep apnea, Crohn's disease status post colostomy, indwelling Foley catheter, chronic systolic CHF.  He was recently admitted for a prolonged hospital course with osteomyelitis and sacral wound.  He also had pseudomonal bacteremia.  Cultures from his wound were mixed.  He was discharged on Zosyn and linezolid.  He was readmitted after failure to thrive at home with acute renal failure.  He is also developed thrombocytopenia.     Pseudomonas bacteremia- source is his long term decub ulcer which also grew Pseudomonas, VRE and proteus and anaerobes from bone biopsy. He has AICD and hence risk  for endocarditis , lead vegetation but TTE is negative. Per cards he is not a great TEE candidate.  He has finished the course of treatment for this.  Stage IV sacral decubitus with chronic osteomyelitis - today the wound looks to be improving with no palpable bone.   Can stop dapto today and remove PICC. Stop metronidazole. However would rec 2 more weeks oral cipro to complete a 6 week total course for the Pseudomonas.  Continue with aggressive wound care.Marland Kitchen  TCP -likely induced by linezolid- increasing to normal.  Will need aggressive wound care.    I will sign off but please call with questions. Leonel Ramsay    10/21/2019, 3:37 PM

## 2019-10-21 NOTE — Progress Notes (Signed)
[facility]  ROUNDING NOTE   Subjective:   Patient denies any acute complaints No nausea or vomiting No shortness of breath  Objective:  Vital signs in last 24 hours:  Temp:  [97.6 F (36.4 C)-98.3 F (36.8 C)] 97.9 F (36.6 C) (03/22 1148) Pulse Rate:  [85-94] 90 (03/22 1148) Resp:  [16-20] 18 (03/22 1148) BP: (98-108)/(64-73) 108/73 (03/22 1148) SpO2:  [99 %-100 %] 100 % (03/22 1148) Weight:  [335 kg] 112 kg (03/22 0257)  Weight change: -1 kg Filed Weights   10/19/19 0412 10/20/19 0518 10/21/19 0257  Weight: 113.8 kg 113 kg 112 kg    Intake/Output: I/O last 3 completed shifts: In: 716 [IV Piggyback:716] Out: 4400 [Urine:2600; Stool:1800]   Intake/Output this shift:  Total I/O In: 1334.8 [P.O.:480; I.V.:553.9; IV Piggyback:300.9] Out: 700 [Urine:600; Stool:100]  Physical Exam: General: NAD, laying in bed  Head: Normocephalic, atraumatic. Moist oral mucosal membranes  Eyes: Anicteric,   Lungs:  Clear to auscultation  Heart: Regular rate and rhythm  Abdomen:  Soft, nontender,   Extremities:  no peripheral edema.  Neurologic:  Alert, able to answer simple questions   Foley catheter in place    Basic Metabolic Panel: Recent Labs  Lab 10/17/19 0514 10/17/19 0514 10/18/19 0423 10/18/19 0423 10/19/19 0616 10/20/19 0432 10/21/19 0519  NA 143  --  142  --  141 137 140  K 3.4*  --  3.3*  --  3.8 3.5 3.8  CL 102  --  101  --  104 101 105  CO2 33*  --  29  --  28 28 27   GLUCOSE 109*  --  112*  --  93 124* 122*  BUN 45*  --  40*  --  30* 29* 29*  CREATININE 2.54*  --  2.19*  --  1.99* 1.89* 1.82*  CALCIUM 7.7*   < > 7.6*   < > 7.9* 7.9* 8.0*  MG 1.5*  --  1.6*  --  1.7 1.6* 1.8  PHOS 1.2*  --  1.6*  --  2.8 2.7  --    < > = values in this interval not displayed.    Liver Function Tests: No results for input(s): AST, ALT, ALKPHOS, BILITOT, PROT, ALBUMIN in the last 168 hours. No results for input(s): LIPASE, AMYLASE in the last 168 hours. No  results for input(s): AMMONIA in the last 168 hours.  CBC: Recent Labs  Lab 10/16/19 0618 10/17/19 0514 10/18/19 0423 10/19/19 0616 10/21/19 0519  WBC 10.7* 10.1 11.4* 12.8* 13.2*  NEUTROABS  --  7.3 7.7 8.7* 8.7*  HGB 7.4* 7.2* 6.7* 8.3* 8.0*  HCT 23.5* 22.2* 21.6* 25.3* 24.7*  MCV 87.0 85.1 86.4 86.1 86.7  PLT 45* 68* 120* 155 252    Cardiac Enzymes: Recent Labs  Lab 10/17/19 0514 10/20/19 0432  CKTOTAL 22* 27*    BNP: Invalid input(s): POCBNP  CBG: No results for input(s): GLUCAP in the last 168 hours.  Microbiology: Results for orders placed or performed during the hospital encounter of 10/11/19  GI pathogen panel by PCR, stool     Status: None   Collection Time: 10/11/19  9:33 AM   Specimen: STOOL  Result Value Ref Range Status   Plesiomonas shigelloides NOT DETECTED NOT DETECTED Final   Yersinia enterocolitica NOT DETECTED NOT DETECTED Final   Vibrio NOT DETECTED NOT DETECTED Final   Enteropathogenic E coli NOT DETECTED NOT DETECTED Final   E coli (ETEC) LT/ST NOT DETECTED NOT DETECTED  Final   E coli 6314 by PCR Not applicable NOT DETECTED Final   Cryptosporidium by PCR NOT DETECTED NOT DETECTED Final   Entamoeba histolytica NOT DETECTED NOT DETECTED Final   Adenovirus F 40/41 NOT DETECTED NOT DETECTED Final   Norovirus GI/GII NOT DETECTED NOT DETECTED Final   Sapovirus NOT DETECTED NOT DETECTED Final    Comment: (NOTE) Performed At: [facility] [place], [place] [id number] Rush Farmer MD YI:[id number]    Vibrio cholerae NOT DETECTED NOT DETECTED Final   Campylobacter by PCR NOT DETECTED NOT DETECTED Final   Salmonella by PCR NOT DETECTED NOT DETECTED Final   E coli (STEC) NOT DETECTED NOT DETECTED Final   Enteroaggregative E coli NOT DETECTED NOT DETECTED Final   Shigella by PCR NOT DETECTED NOT DETECTED Final   Cyclospora cayetanensis NOT DETECTED NOT DETECTED Final   Astrovirus NOT DETECTED NOT DETECTED Final   G lamblia  by PCR NOT DETECTED NOT DETECTED Final   Rotavirus A by PCR NOT DETECTED NOT DETECTED Final  C Difficile Quick Screen w PCR reflex     Status: None   Collection Time: 10/11/19  9:33 AM   Specimen: STOOL  Result Value Ref Range Status   C Diff antigen NEGATIVE NEGATIVE Final   C Diff toxin NEGATIVE NEGATIVE Final   C Diff interpretation No C. difficile detected.  Final    Comment: Performed at [facility], [address] [place], [place] [address]  Culture, blood (Routine X 2) w Reflex to ID Panel     Status: None   Collection Time: 10/11/19  4:34 PM   Specimen: BLOOD RIGHT HAND  Result Value Ref Range Status   Specimen Description BLOOD RIGHT HAND  Final   Special Requests   Final    BOTTLES DRAWN AEROBIC ONLY Blood Culture results may not be optimal due to an inadequate volume of blood received in culture bottles   Culture   Final    NO GROWTH 5 DAYS Performed at [facility], [address], [place], [place] [address]    Report Status 10/16/2019 FINAL  Final    Coagulation Studies: No results for input(s): LABPROT, INR in the last 72 hours.  Urinalysis: No results for input(s): COLORURINE, LABSPEC, PHURINE, GLUCOSEU, HGBUR, BILIRUBINUR, KETONESUR, PROTEINUR, UROBILINOGEN, NITRITE, LEUKOCYTESUR in the last 72 hours.  Invalid input(s): APPERANCEUR    Imaging: No results found.   Medications:   . sodium chloride Stopped (10/21/19 1130)  . ciprofloxacin Stopped (10/21/19 1104)  . DAPTOmycin (CUBICIN)  IV Stopped (10/20/19 2101)  . metronidazole Stopped (10/21/19 0726)   . ascorbic acid  500 mg Oral BID  . atorvastatin  20 mg Oral Daily  . balsalazide  750 mg Oral TID  . Chlorhexidine Gluconate Cloth  6 each Topical Daily  . collagenase  1 application Topical Daily  . digoxin  0.0625 mg Oral Daily  . feeding supplement (NEPRO CARB STEADY)  237 mL Oral TID BM  . finasteride  5 mg Oral Daily  . influenza vaccine adjuvanted  0.5 mL Intramuscular  Tomorrow-1000  . lactobacillus  1 g Oral TID WC  . loperamide  2 mg Oral Q8H  . magnesium oxide  400 mg Oral BID  . midodrine  10 mg Oral TID  . multivitamin with minerals  1 tablet Oral Daily  . pantoprazole  40 mg Oral Daily  . potassium chloride  20 mEq Oral Daily  . rivaroxaban  20 mg Oral Q  supper  . sodium chloride flush  10-40 mL Intracatheter Q12H  . zinc sulfate  220 mg Oral Daily   sodium chloride, acetaminophen **OR** acetaminophen, ipratropium-albuterol, ondansetron (ZOFRAN) IV, sodium chloride flush  Assessment/ Plan:  [name] is a 69 y.o. white male  with diabetes mellitus type II, hypertension, obstructive sleep apnea, congestive heart failure, atrial fibrillation, hyperlipidemia, Crohn's disease, coronary artery disease, GERD, pacemaker,who was admitted to [facility] on 10/11/2019 for Diarrhea of infectious origin [A09] AKI (acute kidney injury) ([place]) [N17.9]  1. Acute renal failure with metabolic acidosis: with baseline creatinine of 0.82, GFR > 60 on 10/01/19.  with underlying diabetic nephropathy with glucosuria and proteinuria.  Renal ultrasound negative. No IV contrast exposure.  AKI likely due to ATN secondary to concurrent infections Serum creatinine improving slowly Urine output 2100 cc Lab Results  Component Value Date   CREATININE 1.82 (H) 10/21/2019   CREATININE 1.89 (H) 10/20/2019   CREATININE 1.99 (H) 10/19/2019     2.  Hypokalemia Replace potassium as needed  3. Anemia with renal failure and thrombocytopenia:  Lab Results  Component Value Date   HGB 8.0 (L) 10/21/2019   Status post PRBC transfusion on 3/15.  add iron tab  4. Bacteremia with sepsis: pseudomonas, enterococcus and proteus on bone culture 09/26/19.  - Appreciate ID input. Cipro, metronidazole and daptomycin.   5. Covid-19 positive recently September 23, 2019    LOS: 10 [place] Pierron Candiss [place] 3/22/20212:22 PM

## 2019-10-21 NOTE — Progress Notes (Signed)
Progress Note    Colton Lamb  BPZ:[id number] DOB: [birth year]  DOA: 10/11/2019 PCP: [name], MD      Brief Narrative:    Medical records reviewed and are as summarized below:  Colton Lamb is an 69 y.o. male       Assessment/Plan:   Principal Problem:   Acute renal failure (ARF) ([place]) Active Problems:   Hyperlipidemia   CAD S/P percutaneous coronary angioplasty - multiple PCIs   Atrial fibrillation ([place])   GERD   CROHN'S DISEASE-LARGE & SMALL INTESTINE   Hypertension, essential   Chronic systolic CHF (congestive heart failure) (H[facility])   Normocytic anemia   Chronic osteomyelitis ([place])   Colostomy in place [facility])   Bacteremia due to Pseudomonas   Diarrhea   COVID-19 virus infection   Thrombocytopenia ([place])   Sacral decubitus ulcer, stage IV ([place])   Acute renal failure:Creatinine is slowly trending down. Baseline around 0.82. Likely due to prerenal secondary to dehydration.  Avoid nephrotoxics   Follow-up with nephrologist.  Monitor BMP  Diarrhea: Resolved.  C. diff PCR negative. GI path panel completely neg.   Hypokalemia, hypophosphatemia and hypomagnesemia: Improved.   Hyperlipidemia: continue on statin   CAD: s/p multiple PCIs. Continue w/ aspirin and lipitor  Atrial fibrillation w/ RVR. PAF vs chronic.  Continue digoxin. Continue xarelto because of high risk for stroke.  Cardiologist has signed off.  Hypotension: Overall, BP has improved but BP still borderline low. This makes use of rate controlling drugs difficult.  Monitor BP closely.  GERD: continue on PPI   Crohns Disease: continue balsalazide. S/p colostomy  Chronic systolic EUM:[id number].  Echo on 09/28/2019 showed EF 25-30%.  Normocytic anemia & acute blood loss anemia/ severe anemia: H&H improved.  s/p transfusion with 2 units of prbc on 10/18/2019.  S/p 1 unit PRBCs 10/13/19.   Monitor H&H  Chronic osteomyelitis & Pseudomonas bacteremia: Sacral decubitus wound grew   Pseudomonas, VRE and Proteus in February 2021.  Continue current antibiotics.  Follow-up with ID.  Completed linezolid course 10/14/19.Completed Daptomycin and Flagyl on 10/21/2019. Continue Ciprofloxacin for additional 14 days per ID, Dr. Ola Spurr  COVID-19 virus infection: asymptomatic  Thrombocytopenia: Resolved.    Sacral decubitus ulcer, stage IV: present on admission. continue local wound care  Generalized weakness: PT and OT recommend SNF at discharge  Failure to thrive: lives at home alone. Cannot take care of himself at home and pt agrees with this.  Patient was not interested in palliative care.   Body mass index is 34.44 kg/m.  (Obesity)   Family Communication/Anticipated D/C date and plan/Code Status   DVT prophylaxis: Continue Xarelto Code Status: Full code Family Communication: Discussed with patient.  Disposition Plan: Patient is from SNF. Medically stable for discharge. Awaiting placement to SNF. Follow-up with social worker to assist with disposition.      Subjective:   No complaints. No dizziness, chest pain, palpitation or shortness of breath  Objective:    Vitals:   10/20/19 2012 10/21/19 0257 10/21/19 0729 10/21/19 1148  BP: 98/64 104/67 106/67 108/73  Pulse: 91 94 86 90  Resp: 20 20 16 18   Temp: 98 F (36.7 C) 98.3 F (36.8 C) 97.7 F (36.5 C) 97.9 F (36.6 C)  TempSrc: Oral Oral Oral Oral  SpO2: 99% 100% 100% 100%  Weight:  112 kg    Height:        Intake/Output Summary (Last 24 hours) at 10/21/2019 1554 Last data filed at 10/21/2019 1236 Gross  per 24 hour  Intake 2467.8 ml  Output 2950 ml  Net -482.2 ml   Filed Weights   10/19/19 0412 10/20/19 0518 10/21/19 0257  Weight: 113.8 kg 113 kg 112 kg    Exam:  GEN: No acute distress  SKIN: Stage IV sacral decubitus ulcer. Wound looks clean  EYES: No pallor or icterus ENT: MMM CV: Irregular rate and rhythm,  PULM: No wheezing or rales heard ABD: soft, ND, NT, +BS, some redness  around colostomy bag.  Colostomy bag has yellowish-brown stools. CNS: AAO x 3 EXT: No edema or tenderness GU: Foley catheter draining amber urine   Data Reviewed:   I have personally reviewed following labs and imaging studies:  Labs: Labs show the following:   Basic Metabolic Panel: Recent Labs  Lab 10/17/19 0514 10/17/19 0514 10/18/19 0423 10/18/19 0423 10/19/19 0616 10/19/19 0616 10/20/19 0432 10/21/19 0519  NA 143  --  142  --  141  --  137 140  K 3.4*   < > 3.3*   < > 3.8   < > 3.5 3.8  CL 102  --  101  --  104  --  101 105  CO2 33*  --  29  --  28  --  28 27  GLUCOSE 109*  --  112*  --  93  --  124* 122*  BUN 45*  --  40*  --  30*  --  29* 29*  CREATININE 2.54*  --  2.19*  --  1.99*  --  1.89* 1.82*  CALCIUM 7.7*  --  7.6*  --  7.9*  --  7.9* 8.0*  MG 1.5*  --  1.6*  --  1.7  --  1.6* 1.8  PHOS 1.2*  --  1.6*  --  2.8  --  2.7  --    < > = values in this interval not displayed.   GFR Estimated Creatinine Clearance: 49.5 mL/min (A) (by C-G formula based on SCr of 1.82 mg/dL (H)). Liver Function Tests: No results for input(s): AST, ALT, ALKPHOS, BILITOT, PROT, ALBUMIN in the last 168 hours. No results for input(s): LIPASE, AMYLASE in the last 168 hours. No results for input(s): AMMONIA in the last 168 hours. Coagulation profile No results for input(s): INR, PROTIME in the last 168 hours.  CBC: Recent Labs  Lab 10/16/19 0618 10/17/19 0514 10/18/19 0423 10/19/19 0616 10/21/19 0519  WBC 10.7* 10.1 11.4* 12.8* 13.2*  NEUTROABS  --  7.3 7.7 8.7* 8.7*  HGB 7.4* 7.2* 6.7* 8.3* 8.0*  HCT 23.5* 22.2* 21.6* 25.3* 24.7*  MCV 87.0 85.1 86.4 86.1 86.7  PLT 45* 68* 120* 155 252   Cardiac Enzymes: Recent Labs  Lab 10/17/19 0514 10/20/19 0432  CKTOTAL 22* 27*   BNP (last 3 results) No results for input(s): PROBNP in the last 8760 hours. CBG: No results for input(s): GLUCAP in the last 168 hours. D-Dimer: No results for input(s): DDIMER in the last 72  hours. Hgb A1c: No results for input(s): HGBA1C in the last 72 hours. Lipid Profile: No results for input(s): CHOL, HDL, LDLCALC, TRIG, CHOLHDL, LDLDIRECT in the last 72 hours. Thyroid function studies: No results for input(s): TSH, T4TOTAL, T3FREE, THYROIDAB in the last 72 hours.  Invalid input(s): FREET3 Anemia work up: No results for input(s): VITAMINB12, FOLATE, FERRITIN, TIBC, IRON, RETICCTPCT in the last 72 hours. Sepsis Labs: Recent Labs  Lab 10/17/19 0514 10/18/19 0423 10/19/19 0616 10/21/19 0519  WBC 10.1 11.4* 12.8*  13.2*    Microbiology Recent Results (from the past 240 hour(s))  Culture, blood (Routine X 2) w Reflex to ID Panel     Status: None   Collection Time: 10/11/19  4:34 PM   Specimen: BLOOD RIGHT HAND  Result Value Ref Range Status   Specimen Description BLOOD RIGHT HAND  Final   Special Requests   Final    BOTTLES DRAWN AEROBIC ONLY Blood Culture results may not be optimal due to an inadequate volume of blood received in culture bottles   Culture   Final    NO GROWTH 5 DAYS Performed at [facility], [address] [place], [place] [address]    Report Status 10/16/2019 FINAL  Final    Procedures and diagnostic studies:  No results found.  Medications:   . ascorbic acid  500 mg Oral BID  . atorvastatin  20 mg Oral Daily  . balsalazide  750 mg Oral TID  . Chlorhexidine Gluconate Cloth  6 each Topical Daily  . collagenase  1 application Topical Daily  . digoxin  0.0625 mg Oral Daily  . feeding supplement (NEPRO CARB STEADY)  237 mL Oral TID BM  . [START ON 10/22/2019] ferrous gluconate  324 mg Oral Q breakfast  . finasteride  5 mg Oral Daily  . influenza vaccine adjuvanted  0.5 mL Intramuscular Tomorrow-1000  . lactobacillus  1 g Oral TID WC  . loperamide  2 mg Oral Q8H  . magnesium oxide  400 mg Oral BID  . midodrine  10 mg Oral TID  . multivitamin with minerals  1 tablet Oral Daily  . pantoprazole  40 mg Oral Daily  . potassium  chloride  20 mEq Oral Daily  . rivaroxaban  20 mg Oral Q supper  . sodium chloride flush  10-40 mL Intracatheter Q12H  . zinc sulfate  220 mg Oral Daily   Continuous Infusions: . sodium chloride 250 mL (10/21/19 1456)  . ciprofloxacin Stopped (10/21/19 1104)  . DAPTOmycin (CUBICIN)  IV Stopped (10/20/19 2101)     LOS: 10 days   Sariyah Corcino  [facility]     10/21/2019, 3:54 PM

## 2019-10-21 NOTE — Plan of Care (Signed)
  Problem: Education: Goal: Knowledge of General Education information will improve Description: Including pain rating scale, medication(s)/side effects and non-pharmacologic comfort measures Outcome: Progressing   Problem: Clinical Measurements: Goal: Will remain free from infection Outcome: Progressing   Problem: Safety: Goal: Ability to remain free from injury will improve Outcome: Progressing

## 2019-10-21 NOTE — Progress Notes (Signed)
Sacral dressing changed this morning. Will continue to monitor.

## 2019-10-22 LAB — CBC WITH DIFFERENTIAL/PLATELET
Abs Immature Granulocytes: 0.56 10*3/uL — ABNORMAL HIGH (ref 0.00–0.07)
Basophils Absolute: 0.1 10*3/uL (ref 0.0–0.1)
Basophils Relative: 1 %
Eosinophils Absolute: 0.5 10*3/uL (ref 0.0–0.5)
Eosinophils Relative: 3 %
HCT: 26 % — ABNORMAL LOW (ref 39.0–52.0)
Hemoglobin: 8.2 g/dL — ABNORMAL LOW (ref 13.0–17.0)
Immature Granulocytes: 4 %
Lymphocytes Relative: 13 %
Lymphs Abs: 1.8 10*3/uL (ref 0.7–4.0)
MCH: 27.7 pg (ref 26.0–34.0)
MCHC: 31.5 g/dL (ref 30.0–36.0)
MCV: 87.8 fL (ref 80.0–100.0)
Monocytes Absolute: 1.6 10*3/uL — ABNORMAL HIGH (ref 0.1–1.0)
Monocytes Relative: 11 %
Neutro Abs: 9.6 10*3/uL — ABNORMAL HIGH (ref 1.7–7.7)
Neutrophils Relative %: 68 %
Platelets: 284 10*3/uL (ref 150–400)
RBC: 2.96 MIL/uL — ABNORMAL LOW (ref 4.22–5.81)
RDW: 19.3 % — ABNORMAL HIGH (ref 11.5–15.5)
WBC: 14 10*3/uL — ABNORMAL HIGH (ref 4.0–10.5)
nRBC: 0.1 % (ref 0.0–0.2)

## 2019-10-22 LAB — BASIC METABOLIC PANEL
Anion gap: 9 (ref 5–15)
BUN: 28 mg/dL — ABNORMAL HIGH (ref 8–23)
CO2: 27 mmol/L (ref 22–32)
Calcium: 8.2 mg/dL — ABNORMAL LOW (ref 8.9–10.3)
Chloride: 107 mmol/L (ref 98–111)
Creatinine, Ser: 1.76 mg/dL — ABNORMAL HIGH (ref 0.61–1.24)
GFR calc Af Amer: 45 mL/min — ABNORMAL LOW (ref >60)
GFR calc non Af Amer: 39 mL/min — ABNORMAL LOW (ref >60)
Glucose, Bld: 107 mg/dL — ABNORMAL HIGH (ref 70–99)
Potassium: 3.9 mmol/L (ref 3.5–5.1)
Sodium: 143 mmol/L (ref 135–145)

## 2019-10-22 LAB — MAGNESIUM: Magnesium: 1.6 mg/dL — ABNORMAL LOW (ref 1.7–2.4)

## 2019-10-22 LAB — PHOSPHORUS: Phosphorus: 2.2 mg/dL — ABNORMAL LOW (ref 2.5–4.6)

## 2019-10-22 MED ORDER — MAGNESIUM SULFATE 2 GM/50ML IV SOLN
2.0000 g | Freq: Once | INTRAVENOUS | Status: AC
  Administered 2019-10-22: 2 g via INTRAVENOUS
  Filled 2019-10-22: qty 50

## 2019-10-22 MED ORDER — K PHOS MONO-SOD PHOS DI & MONO 155-852-130 MG PO TABS
500.0000 mg | ORAL_TABLET | Freq: Three times a day (TID) | ORAL | Status: DC
  Administered 2019-10-22 – 2019-10-23 (×4): 500 mg via ORAL
  Filled 2019-10-22 (×6): qty 2

## 2019-10-22 MED ORDER — CIPROFLOXACIN HCL 500 MG PO TABS
500.0000 mg | ORAL_TABLET | Freq: Two times a day (BID) | ORAL | Status: DC
  Administered 2019-10-22 – 2019-10-23 (×2): 500 mg via ORAL
  Filled 2019-10-22 (×4): qty 1

## 2019-10-22 NOTE — Progress Notes (Signed)
Occupational Therapy Treatment Patient Details Name: Colton Lamb MRN: [id number] DOB: [birth year] Today's Date: 10/22/2019    History of present illness Per MD note: 69 y.o. male was recently in hospital and admitted from home due to leaking colostomy bag and acute renal failure.  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  Also found to be COVID positive. Being treated for pseudomonas bacteremia and possible acute on chronic osteomyelitis. Patient has multiple pressure ulcers present on arrival. Pt well known to therapy and reports he hasn't ambulated in almost 1 year. Has been receiving care at home with a [facility] aide and his sister who indicates she can no longer care for him..   OT comments  Pt laying in bed when OT presents. Pt wanting to do exercise, but does not feel like sitting up. OT gauges pt's goals for therapy. Pt states his biggest goal is still to stand, walk and engage in leisure activities like golf. OT spends extended time on pt education re: importance of activity outside of therapy time to increase strength to a level to have potential to achieve standing. Pt continuously says "I know, I know" to OT education, but it does not appear that pt is completing UB or LB exercise outside of therapy time. OT engages pt in LE and UE therex (listed in miscellaneous exercise section) to improve strength as it pertains to ADLs/ADL transfers/ADL mobility). Pt requires MIN/MOD multimodal cues for form and pace. Pt left in bed with call bell within reach. Pt requires continued skilled OT to improve strength and work toward pt/therapy goals. Anticipate SNF continues to be most appropriate d/c recommendation.   Follow Up Recommendations  SNF    Equipment Recommendations  Other  (comment)(defer to next level of care)    Recommendations for Other Services      Precautions / Restrictions Precautions Precautions: Fall Precaution Comments: sacral wound Restrictions Weight Bearing Restrictions: No       Mobility Bed Mobility                  Transfers                      Balance                                           ADL either performed or assessed with clinical judgement   ADL                                               Vision Patient Visual Report: No change from baseline     Perception     Praxis      Cognition Arousal/Alertness: Awake/alert Behavior During Therapy: WFL for tasks assessed/performed Overall Cognitive Status: Within Functional Limits for tasks assessed                                 General Comments: agreeable and appropriate, but often answers "I know, I know" to multiple therapy-related topics that he  does not appear to demo carry-over with. Requires MIN/MOD verbal/tactile/visual cues throughout UB therex for form.        Exercises Other Exercises Other Exercises: OT engages pt in LE therex without resistance in bed for knee flexion for one set x10 reps, SLR assisted for 1 set x10 reps (alternating), and internal/external rotation for 1 set x20 reps Other Exercises: OT engages pt in light resistance (yellow band) UE therex at bed level for 1 set x10 reps bicep curls, tricep extension, horizontal abduction, and shld abduction. Pt tolerates well. Has to do each exercise one UE at a time and take RB for 30 secs to 1 min between. Requires MIN/MOD multimodal cues for form and pace. Other Exercises: OT engages pt in education re: importance of carryover of exercise outside of therapy time for continuity/strengthening to yield best results and give pt his best chance. Pt reports wanting to walk and still having walking as his main goal so OT spends extended  time on education re: what it would take for pt to accomplish this goal.   Shoulder Instructions       General Comments      Pertinent Vitals/ Pain       Pain Assessment: Faces Faces Pain Scale: Hurts a little bit Pain Location: grimaces with any LE mvmt Pain Descriptors / Indicators: Grimacing Pain Intervention(s): Limited activity within patient's tolerance;Monitored during session  Home Living                                          Prior Functioning/Environment              Frequency  Min 2X/week        Progress Toward Goals  OT Goals(current goals can now be found in the care plan section)  Progress towards OT goals: Progressing toward goals  Acute Rehab OT Goals Patient Stated Goal: I will consider going to rehab OT Goal Formulation: With patient Time For Goal Achievement: 10/28/19 Potential to Achieve Goals: [place] Discharge plan remains appropriate;Frequency remains appropriate    Co-evaluation                 AM-PAC OT "6 Clicks" Daily Activity     Outcome Measure   Help from another person eating meals?: None Help from another person taking care of personal grooming?: None Help from another person toileting, which includes using toliet, bedpan, or urinal?: A Lot Help from another person bathing (including washing, rinsing, drying)?: A Lot Help from another person to put on and taking off regular upper body clothing?: A Little Help from another person to put on and taking off regular lower body clothing?: Total 6 Click Score: 16    End of Session    OT Visit Diagnosis: Muscle weakness (generalized) (M62.81);Other abnormalities of gait and mobility (R26.89) Pain - Right/Left: Left Pain - part of body: Hip   Activity Tolerance Patient tolerated treatment well   Patient Left in bed;with call bell/phone within reach;with bed alarm set;with nursing/sitter in room   Nurse Communication          Time:  6378-5885 OT Time Calculation (min): 41 min  Charges: OT General Charges $OT Visit: 1 Visit OT Treatments $Self Care/Home Management : 8-22 mins $Therapeutic Exercise: 23-37 mins  Gerrianne Scale, MS, OTR/L ascom [phone] 10/22/19, 1:32 PM

## 2019-10-22 NOTE — Progress Notes (Signed)
VAST consulted to remove PICC. Around noon, spoke with pt's nurse via SecureChat about pt's Cipro being changed to PO route before discontinuing PICC.

## 2019-10-22 NOTE — Progress Notes (Signed)
Right arm SL PICC removed per protocol per MD order. Manual pressure applied for 5 mins. No bleeding or swelling noted. Instructed patient to remain in bed until 1505. Educated patient about S/S of infection and when to call MD; no heavy lifting or pressure on right side for 24 hours; keep dressing dry and intact until 1500 tomorrow. Pt verbalized comprehension.

## 2019-10-22 NOTE — Care Management Important Message (Signed)
Important Message  Patient Details  Name: JOSIAH NIETO MRN: [id number] Date of Birth: 1950/09/14   Medicare Important Message Given:  Yes  Reviewed via room phone with patient due to isolation.   Dannette Barbara 10/22/2019, 11:13 AM

## 2019-10-22 NOTE — Consult Note (Signed)
Pharmacy Antibiotic Note  Colton Lamb is a 69 y.o. male admitted on 10/11/2019 with infected sacral decubitus ulcer. Patient with acute renal failure with Scr 4.64 (baseline 0.7-0.8). Recently admitted 2/22-3/3 for sepsis secondary to Pseudomonas bacteremia and osteomyelitis. He is on outpatient antibiotics including pip/tazo 13.5 g over 24h as a continuous infusion (planned stop date 3/22) and linezolid 600 mg q12h (planned stop date 3/15). Patient came in with pip/tazo infusion running now stopped. Both Zosyn and Linezolid have not been stopped. Pharmacy has been consulted for ciprofloxacin and daptomycin dosing. Patient is also on metronidazole.   2/25 wound cultures with: E faecium, PSA, and proteus  Patient currently afebrile. WBC 10.6. Blood cultures NGTD. Intake weight was 108 kg and subsequent weights were charted at 80 - 88 kg. Nursing staff re-checked today and weight was 111 kg. Most recent weight appears most consistent with last admission.  Daptomycin and flagyl stopped on 3/22  Plan: Ciprofloxacin 478m IV Q12 hours for 2 weeks.   Continue to monitor renal function and adjust antibiotics as indicated.  Height: 5' 11"  (180.3 cm) Weight: (low bed) IBW/kg (Calculated) : 75.3  Temp (24hrs), Avg:98.1 F (36.7 C), Min:98 F (36.7 C), Max:98.3 F (36.8 C)  Recent Labs  Lab 10/17/19 0514 10/17/19 0514 10/18/19 0423 10/19/19 0616 10/20/19 0432 10/21/19 0519 10/22/19 0443  WBC 10.1  --  11.4* 12.8*  --  13.2* 14.0*  CREATININE 2.54*   < > 2.19* 1.99* 1.89* 1.82* 1.76*   < > = values in this interval not displayed.    Estimated Creatinine Clearance: 51.1 mL/min (A) (by C-G formula based on SCr of 1.76 mg/dL (H)).    Allergies  Allergen Reactions  . Iodine Other (See Comments)    Shortness of breath, swelling and hives  . Shrimp [Shellfish Allergy] Other (See Comments)    SWELLING    HIVES    SHORTNESS OF BREATH  . Tetracycline Rash    Antimicrobials this  admission: Daptomycin 3/17 >> 3/22 Metronidazole 3/16 >> 3/22 Ciprofloxacin 3/16 >> Linezolid 3/12 >> 3/14 Pip/tazo 3/13 >> 3/16  Dose adjustments this admission: n/a  Microbiology results: 3/12 BCx: NGTD 3/12 C diff: negative 3/12 GI panel: negative  Thank you for allowing pharmacy to be a part of this patient's care.  ARowland Lathe PharmD 10/22/2019 2:13 PM

## 2019-10-22 NOTE — Progress Notes (Addendum)
Progress Note    Colton Lamb  NWG:[id number] DOB: 10-13-1950  DOA: 10/11/2019 PCP: [name], MD      Brief Narrative:    Medical records reviewed and are as summarized below:  Colton Lamb is an 69 y.o. male  with medical history significant of hypertension, hyperlipidemia, diabetes mellitus, GERD, depression, PAF on Xarelto, OSA, Crohn's disease, s/p colostomy, indwelling Foley catheter for chronic urinary retention,  chronic systolic CHF with EF 57-84%, chronic osteomyelitis, recent admission due to Pseudomonas bacteremia, hypotension, covid 19 infection 09/23/19.  He presented to the hospital with increased colostomy bag output/diarrhea.  Patient was recently hospitalized from 2/22-3/3 due to Pseudomonas bacteremia secondary to chronic osteomyelitis. Patient is supposed to be on IV Zosyn until 3/22 and linezolid until 3/15.   He was found to have acute kidney injury complicated by metabolic acidosis with creatinine of 4.64 and BUN of 73.  He was treated with IV fluids and renal function has slowly improved.  Nephrologist was consulted to assist with management.  Stool for C. difficile toxin and GI pathogen panel were negative.  He developed atrial fibrillation with rapid ventricular response.  However, he was hypotensive and so this made it difficult to use rate controlling drugs.  He was started on digoxin with improvement in heart rate.  He was seen in consultation by the cardiologist to assist with management.  He developed severe thrombocytopenia and hematologist was consulted for this.  Thrombocytopenia was likely due to antibiotics.  Thrombocytopenia has resolved.  He also developed severe anemia.  He was transfused with a total of 2 units of packed red blood cells and posttransfusion hemoglobin has been stable.  He developed hypokalemia, hypomagnesemia and hypophosphatemia that were treated as well.  His condition has slowly improved and ID specialist, Dr.  Ola Spurr, recommended oral ciprofloxacin through November 04, 2019.      Assessment/Plan:   Principal Problem:   Acute renal failure (ARF) (HCC) Active Problems:   Hyperlipidemia   CAD S/P percutaneous coronary angioplasty - multiple PCIs   Atrial fibrillation (HCC)   GERD   CROHN'S DISEASE-LARGE & SMALL INTESTINE   Hypertension, essential   Chronic systolic CHF (congestive heart failure) (HCC)   Normocytic anemia   Chronic osteomyelitis ([place])   Colostomy in place [facility])   Bacteremia due to Pseudomonas   Diarrhea   COVID-19 virus infection   Thrombocytopenia (HCC)   Sacral decubitus ulcer, stage IV ([place])   Acute renal failure:Creatinine is slowly trending down. Baseline around 0.82. Likely due to prerenal secondary to dehydration.  Avoid nephrotoxics   Follow-up with nephrologist.  Monitor BMP  Diarrhea: Resolved.  C. diff PCR negative. GI path panel completely neg.   Hypokalemia, hypophosphatemia and hypomagnesemia: Improved.  Replete magnesium and phosphorus  Hyperlipidemia: continue on statin   CAD: s/p multiple PCIs. Continue w/ aspirin and lipitor  Atrial fibrillation w/ RVR. PAF vs chronic.  Continue digoxin. Continue xarelto because of high risk for stroke.  Cardiologist has signed off.  Hypotension: Overall, BP has improved but BP still borderline low. This makes use of rate controlling drugs difficult.  Monitor BP closely.  GERD: continue on PPI   Crohns Disease: continue balsalazide. S/p colostomy  Chronic systolic ONG:[id number].  Echo on 09/28/2019 showed EF 25-30%.  Normocytic anemia & acute blood loss anemia/ severe anemia: H&H improved.  s/p transfusion with 1 unit of prbc on 10/18/2019.  S/p 1 unit PRBCs 10/13/19.   Monitor H&H  Chronic osteomyelitis & Pseudomonas  bacteremia: Sacral decubitus wound grew  Pseudomonas, VRE and Proteus in February 2021.  Continue current antibiotics.  Follow-up with ID.  Completed linezolid course 10/14/19.Completed  Daptomycin and Flagyl on 10/21/2019. Continue oral ciprofloxacin through November 04, 2019 per ID, Dr. Ola Spurr.  Remove PICC line  COVID-19 virus infection: asymptomatic  Thrombocytopenia: Resolved.    Sacral decubitus ulcer, stage IV: present on admission. continue local wound care  Generalized weakness: PT and OT recommend SNF at discharge  Failure to thrive: lives at home alone. Cannot take care of himself at home and pt agrees with this.  Patient was not interested in palliative care.   Body mass index is 34.44 kg/m.  (Obesity)   Family Communication/Anticipated D/C date and plan/Code Status   DVT prophylaxis: Continue Xarelto Code Status: Full code Family Communication: Discussed with patient.  Disposition Plan: Patient is from SNF. Medically stable for discharge. Awaiting placement to SNF. Follow-up with social worker to assist with disposition.      Subjective:   He has no complaints.  He feels comfortable.  He has no pain.  Objective:    Vitals:   10/21/19 2028 10/22/19 0515 10/22/19 0827 10/22/19 1151  BP: 106/73 105/71 107/66 111/66  Pulse: 88 87 87 89  Resp: 16 18 18 18   Temp: 98 F (36.7 C) 98 F (36.7 C) 98 F (36.7 C)   TempSrc: Oral Oral Oral   SpO2: 96% 100% 99% 100%  Weight:      Height:        Intake/Output Summary (Last 24 hours) at 10/22/2019 1445 Last data filed at 10/22/2019 1350 Gross per 24 hour  Intake 467.5 ml  Output 2575 ml  Net -2107.5 ml   Filed Weights   10/19/19 0412 10/20/19 0518 10/21/19 0257  Weight: 113.8 kg 113 kg 112 kg    Exam:  GEN: No acute distress  SKIN: Stage IV sacral decubitus ulcer.  EYES: No abnormality noted ENT: MMM CV: Irregular rate and rhythm,  PULM: No wheezing or rales heard ABD: soft, ND, NT, +BS, some redness around colostomy bag.   CNS: AAO x 3 EXT: No edema or tenderness GU: Foley catheter draining amber urine   Data Reviewed:   I have personally reviewed following labs and imaging  studies:  Labs: Labs show the following:   Basic Metabolic Panel: Recent Labs  Lab 10/17/19 0514 10/17/19 0514 10/18/19 0423 10/18/19 0423 10/19/19 6789 10/19/19 3810 10/20/19 0432 10/20/19 0432 10/21/19 0519 10/22/19 0443  NA 143   < > 142  --  141  --  137  --  140 143  K 3.4*   < > 3.3*   < > 3.8   < > 3.5   < > 3.8 3.9  CL 102   < > 101  --  104  --  101  --  105 107  CO2 33*   < > 29  --  28  --  28  --  27 27  GLUCOSE 109*   < > 112*  --  93  --  124*  --  122* 107*  BUN 45*   < > 40*  --  30*  --  29*  --  29* 28*  CREATININE 2.54*   < > 2.19*  --  1.99*  --  1.89*  --  1.82* 1.76*  CALCIUM 7.7*   < > 7.6*  --  7.9*  --  7.9*  --  8.0* 8.2*  MG 1.5*   < >  1.6*  --  1.7  --  1.6*  --  1.8 1.6*  PHOS 1.2*  --  1.6*  --  2.8  --  2.7  --   --  2.2*   < > = values in this interval not displayed.   GFR Estimated Creatinine Clearance: 51.1 mL/min (A) (by C-G formula based on SCr of 1.76 mg/dL (H)). Liver Function Tests: No results for input(s): AST, ALT, ALKPHOS, BILITOT, PROT, ALBUMIN in the last 168 hours. No results for input(s): LIPASE, AMYLASE in the last 168 hours. No results for input(s): AMMONIA in the last 168 hours. Coagulation profile No results for input(s): INR, PROTIME in the last 168 hours.  CBC: Recent Labs  Lab 10/17/19 0514 10/18/19 0423 10/19/19 0616 10/21/19 0519 10/22/19 0443  WBC 10.1 11.4* 12.8* 13.2* 14.0*  NEUTROABS 7.3 7.7 8.7* 8.7* 9.6*  HGB 7.2* 6.7* 8.3* 8.0* 8.2*  HCT 22.2* 21.6* 25.3* 24.7* 26.0*  MCV 85.1 86.4 86.1 86.7 87.8  PLT 68* 120* 155 252 284   Cardiac Enzymes: Recent Labs  Lab 10/17/19 0514 10/20/19 0432  CKTOTAL 22* 27*   BNP (last 3 results) No results for input(s): PROBNP in the last 8760 hours. CBG: No results for input(s): GLUCAP in the last 168 hours. D-Dimer: No results for input(s): DDIMER in the last 72 hours. Hgb A1c: No results for input(s): HGBA1C in the last 72 hours. Lipid Profile: No results  for input(s): CHOL, HDL, LDLCALC, TRIG, CHOLHDL, LDLDIRECT in the last 72 hours. Thyroid function studies: No results for input(s): TSH, T4TOTAL, T3FREE, THYROIDAB in the last 72 hours.  Invalid input(s): FREET3 Anemia work up: No results for input(s): VITAMINB12, FOLATE, FERRITIN, TIBC, IRON, RETICCTPCT in the last 72 hours. Sepsis Labs: Recent Labs  Lab 10/18/19 0423 10/19/19 0616 10/21/19 0519 10/22/19 0443  WBC 11.4* 12.8* 13.2* 14.0*    Microbiology No results found for this or any previous visit (from the past 240 hour(s)).  Procedures and diagnostic studies:  No results found.  Medications:   . ascorbic acid  500 mg Oral BID  . [START ON 10/23/2019] atorvastatin  80 mg Oral Daily  . balsalazide  750 mg Oral TID  . Chlorhexidine Gluconate Cloth  6 each Topical Daily  . ciprofloxacin  500 mg Oral BID  . collagenase  1 application Topical Daily  . digoxin  0.0625 mg Oral Daily  . feeding supplement (NEPRO CARB STEADY)  237 mL Oral TID BM  . ferrous gluconate  324 mg Oral Q breakfast  . finasteride  5 mg Oral Daily  . influenza vaccine adjuvanted  0.5 mL Intramuscular Tomorrow-1000  . lactobacillus  1 g Oral TID WC  . loperamide  2 mg Oral Q8H  . magnesium oxide  400 mg Oral BID  . midodrine  10 mg Oral TID  . multivitamin with minerals  1 tablet Oral Daily  . pantoprazole  40 mg Oral Daily  . rivaroxaban  20 mg Oral Q supper  . sodium chloride flush  10-40 mL Intracatheter Q12H  . zinc sulfate  220 mg Oral Daily   Continuous Infusions: . sodium chloride 250 mL (10/21/19 1824)     LOS: 11 days   Abigaile Rossie  [facility]     10/22/2019, 2:45 PM

## 2019-10-22 NOTE — Progress Notes (Addendum)
Nutrition Follow Up Notes  DOCUMENTATION CODES:   Not applicable  INTERVENTION:   Nepro Shake po TID, each supplement provides 425 kcal and 19 grams protein  Magic cup BID with lunch and dinner, each supplement provides 290 kcal and 9 grams of protein  MVI daily  Vitamin C 542m po BID  NUTRITION DIAGNOSIS:   Increased nutrient needs related to wound healing as evidenced by increased estimated needs.  GOAL:   Patient will meet greater than or equal to 90% of their needs  -progressing    MONITOR:   PO intake, Supplement acceptance, Skin, Labs  ASSESSMENT:   69yo male admitted with infected sacral decubitus ulcer, acute severe metabolic acidosis, increased colostomy output, AKI. PMH includes COVID-19 infection 09/23/19, HTN, HLD, DM, GERD, depression, PAF, OSA, Crohn's disease, colostomy, CHF, chronic osteomyelitis.   Pt with improved appetite and oral intake; pt eating 100% of meals and is drinking Nepro supplements. Pt is refeeding today; recommend monitor electrolytes until stable. Per chart, pt with ~8lb weight gain since admit; pt is back up to his UBW.   Medications reviewed and include: vitamin C,  ciprofloxacin, ferrous gluconate, lactobacillus, imodium, Mg oxide, MVI, protonix, zinc  Labs reviewed: K 3.9 wnl, BUN 28(H), creat 1.76(H), P 2.2(L), Mg 1.6(L) Wbc 14.0(H), Hgb 8.2(L), Hct 26.0(L)  Diet Order:   Diet Order            Diet Carb Modified Fluid consistency: Thin; Room service appropriate? Yes  Diet effective now             EDUCATION NEEDS:   Not appropriate for education at this time  Skin:  Skin Assessment: Skin Integrity Issues: Skin Integrity Issues:: DTI, Stage II, Stage IV DTI: buttocks Stage II: back, L thigh, L buttocks Stage IV: sacrum   Last BM:  3/23- type 6 via colostomy- via colostomy   Height:   Ht Readings from Last 1 Encounters:  10/11/19 5' 11"  (1.803 m)    Weight:   Wt Readings from Last 1 Encounters:  10/21/19 112  kg    Ideal Body Weight:  78.2 kg  BMI:  Body mass index is 34.44 kg/m.  Estimated Nutritional Needs:   Kcal:  2100-2300  Protein:  120-140 gm  Fluid:  2-2.2 L  CKoleen DistanceMS, RD, LDN Contact information available in Amion

## 2019-10-22 NOTE — TOC Progression Note (Signed)
Transition of Care [facility]) - Progression Note    Patient Details  Name: Colton Lamb MRN: [id number] Date of Birth: [birth year]  [facility] CM/SW [place], LCSW Phone Number: 10/22/2019, 3:14 PM  Clinical Narrative:   The total amount to admit to [facility] was discussed with pt again.Pt has agreed to pay [facility] the total up front. Pt ask CSW contact [name] as she is handling his financiers. Total was discussed with [name] and she is agreeable to contact [place] to pay this amount. [place] was contact--However, due to the large amount [name] is having to contact her bank to allow this transaction. Bed will be available for pt tomorrow, hopeful transaction will be settle by tommorow. MD aware and states pt will be cleared to d/c tomorrow.    Expected Discharge Plan: [place] Barriers to Discharge: Continued Medical Work up  Expected Discharge Plan and Services Expected Discharge Plan: [place] In-house Referral: Clinical Social Work   Post Acute Care Choice: [place] Living arrangements for the past 2 months: [place] Determinants of Health (SDOH) Interventions    Readmission Risk Interventions Readmission Risk Prevention Plan 10/14/2019 10/14/2019 07/18/2019  Transportation Screening Complete Complete Complete  Social Work Consult for [place] - - -  Medication Review Press photographer) Complete Complete Complete  PCP or Specialist appointment within 3-5 days of discharge Complete Complete (No Data)  [place] or [place] Complete Complete -  [place] or Home Care Consult Pt Refusal Comments - - return to SNF  SW Recovery Care/Counseling Consult Complete Complete -  Palliative Care Screening Not Applicable Not Applicable -  [place] Not Applicable Not Applicable Complete  Some  recent data might be hidden

## 2019-10-23 LAB — CBC WITH DIFFERENTIAL/PLATELET
Abs Immature Granulocytes: 0.37 10*3/uL — ABNORMAL HIGH (ref 0.00–0.07)
Basophils Absolute: 0.1 10*3/uL (ref 0.0–0.1)
Basophils Relative: 1 %
Eosinophils Absolute: 0.5 10*3/uL (ref 0.0–0.5)
Eosinophils Relative: 4 %
HCT: 26 % — ABNORMAL LOW (ref 39.0–52.0)
Hemoglobin: 8.3 g/dL — ABNORMAL LOW (ref 13.0–17.0)
Immature Granulocytes: 3 %
Lymphocytes Relative: 15 %
Lymphs Abs: 2 10*3/uL (ref 0.7–4.0)
MCH: 27.4 pg (ref 26.0–34.0)
MCHC: 31.9 g/dL (ref 30.0–36.0)
MCV: 85.8 fL (ref 80.0–100.0)
Monocytes Absolute: 1.4 10*3/uL — ABNORMAL HIGH (ref 0.1–1.0)
Monocytes Relative: 10 %
Neutro Abs: 9.1 10*3/uL — ABNORMAL HIGH (ref 1.7–7.7)
Neutrophils Relative %: 67 %
Platelets: 284 10*3/uL (ref 150–400)
RBC: 3.03 MIL/uL — ABNORMAL LOW (ref 4.22–5.81)
RDW: 19 % — ABNORMAL HIGH (ref 11.5–15.5)
WBC: 13.4 10*3/uL — ABNORMAL HIGH (ref 4.0–10.5)
nRBC: 0 % (ref 0.0–0.2)

## 2019-10-23 LAB — BASIC METABOLIC PANEL
Anion gap: 8 (ref 5–15)
BUN: 29 mg/dL — ABNORMAL HIGH (ref 8–23)
CO2: 27 mmol/L (ref 22–32)
Calcium: 8.1 mg/dL — ABNORMAL LOW (ref 8.9–10.3)
Chloride: 104 mmol/L (ref 98–111)
Creatinine, Ser: 1.69 mg/dL — ABNORMAL HIGH (ref 0.61–1.24)
GFR calc Af Amer: 47 mL/min — ABNORMAL LOW (ref >60)
GFR calc non Af Amer: 41 mL/min — ABNORMAL LOW (ref >60)
Glucose, Bld: 102 mg/dL — ABNORMAL HIGH (ref 70–99)
Potassium: 3.5 mmol/L (ref 3.5–5.1)
Sodium: 139 mmol/L (ref 135–145)

## 2019-10-23 LAB — PHOSPHORUS: Phosphorus: 2.8 mg/dL (ref 2.5–4.6)

## 2019-10-23 LAB — MAGNESIUM: Magnesium: 1.8 mg/dL (ref 1.7–2.4)

## 2019-10-23 MED ORDER — DIGOXIN 62.5 MCG PO TABS: 0.0625 mg | ORAL_TABLET | Freq: Every day | ORAL | 0 refills | Status: DC

## 2019-10-23 MED ORDER — FERROUS GLUCONATE 324 (38 FE) MG PO TABS: 324.0000 mg | ORAL_TABLET | Freq: Every day | ORAL | 0 refills | Status: DC

## 2019-10-23 MED ORDER — METOPROLOL TARTRATE 5 MG/5ML IV SOLN
2.5000 mg | Freq: Once | INTRAVENOUS | Status: AC
  Administered 2019-10-23: 2.5 mg via INTRAVENOUS
  Filled 2019-10-23: qty 5

## 2019-10-23 MED ORDER — CIPROFLOXACIN HCL 500 MG PO TABS: 500.0000 mg | ORAL_TABLET | Freq: Two times a day (BID) | ORAL | 0 refills | Status: AC

## 2019-10-23 MED ORDER — SODIUM BICARBONATE 8.4 % IV SOLN
INTRAVENOUS | Status: AC
  Filled 2019-10-23: qty 50

## 2019-10-23 NOTE — TOC Transition Note (Signed)
Transition of Care [facility]) - CM/SW Discharge Note   Patient Details  Name: Colton Lamb MRN: [id number] Date of Birth: 1951-06-06  [facility] CM/SW Contact:  Eileen Stanford, LCSW Phone Number: 10/23/2019, 1:25 PM   Clinical Narrative:   Clinical Social Worker facilitated patient discharge including contacting patient family and facility to confirm patient discharge plans.  Clinical information faxed to facility and family agreeable with plan.  CSW arranged ambulance transport via [place] to [place].  RN to call for report prior to discharge.   Final next level of care: Skilled Nursing Facility Barriers to Discharge: No Barriers Identified   Patient Goals and CMS Choice Patient states their goals for this hospitalization and ongoing recovery are:: to get stronger   Choice offered to / list presented to : Patient  Discharge Placement              Patient [facility] bed at: [facility] Patient to be transferred to facility by: [facility] Name of family member notified: Pt aware Patient and family notified of of transfer: 10/23/19  Discharge Plan and Services In-house Referral: Clinical Social Work   Post Acute Care Choice: [place]                               Social Determinants of Health ([place]) Interventions     Readmission Risk Interventions Readmission Risk Prevention Plan 10/14/2019 10/14/2019 07/18/2019  Transportation Screening Complete Complete Complete  Social Work Consult for [place] - - -  Medication Review Press photographer) Complete Complete Complete  PCP or Specialist appointment within 3-5 days of discharge Complete Complete (No Data)  [place] or Home Care Consult Complete Complete -  [place] or Home Care Consult Pt Refusal Comments - - return to SNF  SW Recovery Care/Counseling Consult Complete Complete -  Palliative Care Screening Not Applicable Not Applicable -  Skilled  Nursing Facility Not Applicable Not Applicable Complete  Some recent data might be hidden

## 2019-10-23 NOTE — Discharge Summary (Signed)
Physician Discharge Summary  Colton Lamb QMV:[id number] DOB: 1951-01-21 DOA: 10/11/2019  PCP: [name], MD  Admit date: 10/11/2019 Discharge date: 10/23/2019  Admitted From: home Disposition:  SNF  Recommendations for Outpatient Follow-up:  1. Follow up with PCP in 1-2 weeks 2. F/u cardio, Dr. Rockey Situ in 1-2 weeks 3. F/u ID, Dr. Ola Spurr in 2 weeks  Home Health: Equipment/Devices:  Discharge Condition: stable CODE STATUS: full Diet recommendation: Heart Healthy / Carb Modified   Brief/Interim Summary: HPI was taken from Dr. Blaine Lamb: Colton Lamb is a 69 y.o. male with medical history significant of hypertension, hyperlipidemia, diabetes mellitus, GERD, depression, PAF on Xarelto, OSA, Crohn's disease, s/p of colostomy, indwelling Foley catheter, sCHF with EF 25-30%, chronic osteomyelitis, recent admission due to Pseudomonas bacteremia, hypotension, covid 19 infection 09/23/19, who presents with increased colostomy bag output.  Patient was recently hospitalized from 2/22-3/3 due to Pseudomonas bacteremia secondary to chronic osteomyelitis. Patient is supposed to be on IV Zosyn until 3/22 and linezolid until 3/15. Per patient's home health nurse, there was concern that patient may have C. difficile. They have noticed that patient had increasing stool output from his colostomy bag over the past few days.  Patient has nausea, no vomiting or abdominal pain.  No fever or chills.  Patient does not have cough, shortness of breath, chest pain.  No fever or chills. There is some redness noted around his colostomy bag which patient states he thinks has been there for about a week. Patient's PICC line is on his right arm without any redness. He has Foley in place that he says has been there since 2020 denies any urinary symptoms.   ED Course: pt was found to have negative C. difficile PCR, WBC 12.0, BMP 1655, AKI with creatinine 4.43, BUN 73 (creatinine 0.82 recently), temperature  normal, oxygen saturation 99% on room air, blood pressure 128/82, heart rate 84, RR 14.  Patient is admitted to [facility] bed as inpatient.  Nephrology, Dr. Holley Raring   From Dr. Mal Misty: Colton Lamb is an 69 y.o. male with medical history significant ofhypertension, hyperlipidemia, diabetes mellitus, GERD, depression, PAF on Xarelto, OSA, Crohn's disease,s/pcolostomy, indwelling Foley catheter for chronic urinary retention, chronic systolic CHF with EF 28-41%, chronic osteomyelitis, recent admission due to Pseudomonas bacteremia, hypotension,covid 19 infection 09/23/19.  He presented to the hospital with increased colostomy bag output/diarrhea.  Patient was recently hospitalized from 2/22-3/3 due to Pseudomonas bacteremia secondary to chronic osteomyelitis.Patientissupposed to be on IV Zosyn until 3/22 and linezolid until 3/15.   He was found to have acute kidney injury complicated by metabolic acidosis with creatinine of 4.64 and BUN of 73.  He was treated with IV fluids and renal function has slowly improved.  Nephrologist was consulted to assist with management.  Stool for C. difficile toxin and GI pathogen panel were negative.  He developed atrial fibrillation with rapid ventricular response.  However, he was hypotensive and so this made it difficult to use rate controlling drugs.  He was started on digoxin with improvement in heart rate.  He was seen in consultation by the cardiologist to assist with management.  He developed severe thrombocytopenia and hematologist was consulted for this.  Thrombocytopenia was likely due to antibiotics.  Thrombocytopenia has resolved.  He also developed severe anemia.  He was transfused with a total of 2 units of packed red blood cells and posttransfusion hemoglobin has been stable.  He developed hypokalemia, hypomagnesemia and hypophosphatemia that were treated as well.  His condition has  slowly improved and ID specialist, Dr. Ola Spurr,  recommended oral ciprofloxacin through November 04, 2019.  Discharge Diagnoses:  Principal Problem:   Acute renal failure (ARF) (HCC) Active Problems:   Hyperlipidemia   CAD S/P percutaneous coronary angioplasty - multiple PCIs   Atrial fibrillation (HCC)   GERD   CROHN'S DISEASE-LARGE & SMALL INTESTINE   Hypertension, essential   Chronic systolic CHF (congestive heart failure) (HCC)   Normocytic anemia   Chronic osteomyelitis ([place])   Colostomy in place ([place])   Bacteremia due to Pseudomonas   Diarrhea   COVID-19 virus infection   Thrombocytopenia (HCC)   Sacral decubitus ulcer, stage IV ([place])  Acute renal failure:Creatinine is slowly trending down. Baseline around 0.82. Likely due to prerenal secondary to dehydration. Avoid nephrotoxics   Follow-up with nephrologist.  Monitor BMP  Diarrhea: Resolved.  C. diff PCR negative. GI path panel completely neg.   Hypokalemia, hypophosphatemia and hypomagnesemia: all WNL today  Hyperlipidemia: continue on statin   CAD: s/p multiple PCIs. Continue w/ aspirin and lipitor  Atrial fibrillation w/ RVR. PAF vs chronic.  Continue digoxin. Continue xarelto because of high risk for stroke.  Cardiologist has signed off.  Hypotension: Overall, BP has improved but BP still borderline low. This makes use of rate controlling drugs difficult.  Monitor BP closely.  GERD: continue on PPI   Crohns disease: continue balsalazide. S/p colostomy  Chronic systolic VVO:[id number].  Echo on 09/28/2019 showed EF 25-30%.  Normocytic anemia & acute blood loss anemia/ severe anemia: H&H improved.  s/p transfusions 2 units of PRBCs. Monitor H&H  Chronic osteomyelitis & Pseudomonas bacteremia: Sacral decubitus wound grew  Pseudomonas, VRE and Proteus in February 2021.  Continue current antibiotics.  Follow-up with ID.  Completed linezolid course 10/14/19.Completed Daptomycin and Flagyl on 10/21/2019. Continue oral ciprofloxacin through November 04, 2019 per  ID, Dr. Ola Spurr.  Remove PICC line  COVID-19 virus infection: asymptomatic  Thrombocytopenia: Resolved.    Sacral decubitus ulcer, stage IV: present on admission. continue local wound care  Generalized weakness: PT and OT recommend SNF at discharge  Failure to thrive: lives at home alone. Cannot take care of himself at home and pt agrees with this.  Patient was not interested in palliative care.  Obesity: BMI 34.4. Would benefit from weight loss   Discharge Instructions  Discharge Instructions    Diet - low sodium heart healthy   Complete by: As directed    Discharge instructions   Complete by: As directed    F/U PCP in 1-2 weeks; F/u cardio in 1-2 weeks; F/u w/ ID in 1-2 weeks   Increase activity slowly   Complete by: As directed      Allergies as of 10/23/2019      Reactions   Iodine Other (See Comments)   Shortness of breath, swelling and hives   Shrimp [shellfish Allergy] Other (See Comments)   SWELLING    HIVES    SHORTNESS OF BREATH   Tetracycline Rash      Medication List    STOP taking these medications   aspirin 81 MG tablet   linezolid 600 MG tablet Commonly known as: Zyvox   piperacillin-tazobactam  IVPB Commonly known as: ZOSYN     TAKE these medications   acetaminophen 325 MG tablet Commonly known as: TYLENOL Take 1-2 tablets (325-650 mg total) by mouth every 4 (four) hours as needed for mild pain.   Albuterol Sulfate 108 (90 Base) MCG/ACT Aepb Inhale 1 puff into the lungs every 6 (six)  hours as needed (shortness of breath).   ascorbic acid 500 MG tablet Commonly known as: VITAMIN C Take 1 tablet (500 mg total) by mouth 2 (two) times daily.   atorvastatin 80 MG tablet Commonly known as: LIPITOR TAKE 1 TABLET BY MOUTH EVERY DAY   balsalazide 750 MG capsule Commonly known as: COLAZAL TAKE 1 CAPSULE (750 MG TOTAL) BY MOUTH 3 (THREE) TIMES DAILY. What changed:   how much to take  how to take this  when to take  this  additional instructions   ciprofloxacin 500 MG tablet Commonly known as: CIPRO Take 1 tablet (500 mg total) by mouth 2 (two) times daily for 12 days.   collagenase ointment Commonly known as: SANTYL Apply 1 application topically daily. (apply to sacrum)   DECUBI-VITE PO Take 1 capsule by mouth daily.   Digoxin 62.5 MCG Tabs Take 0.0625 mg by mouth daily. Start taking on: October 24, 2019 What changed:   medication strength  how much to take   feeding supplement (NEPRO CARB STEADY) Liqd Take 237 mLs by mouth 2 (two) times daily between meals.   ferrous gluconate 324 MG tablet Commonly known as: FERGON Take 1 tablet (324 mg total) by mouth daily with breakfast. Start taking on: October 24, 2019   finasteride 5 MG tablet Commonly known as: PROSCAR Take 1 tablet (5 mg total) by mouth daily.   insulin aspart 100 UNIT/ML injection Commonly known as: novoLOG Inject 5 Units into the skin 3 (three) times daily with meals. What changed: when to take this   ipratropium-albuterol 0.5-2.5 (3) MG/3ML Soln Commonly known as: DUONEB Take 3 mLs by nebulization every 4 (four) hours as needed (shortness of breath or wheezing).   lactobacillus Pack Take 1 packet (1 g total) by mouth 3 (three) times daily with meals.   midodrine 10 MG tablet Commonly known as: PROAMATINE Take 1 tablet (10 mg total) by mouth 3 (three) times daily.   omeprazole 20 MG capsule Commonly known as: PRILOSEC Take 20 mg by mouth daily.   ondansetron 4 MG tablet Commonly known as: ZOFRAN Take 4 mg by mouth every 8 (eight) hours as needed for nausea or vomiting.   tamsulosin 0.4 MG Caps capsule Commonly known as: FLOMAX Take 1 capsule (0.4 mg total) by mouth daily after breakfast.   Xarelto 20 MG Tabs tablet Generic drug: rivaroxaban TAKE 1 TABLET BY MOUTH EVERY DAY WITH LUNCH What changed: See the new instructions.      Contact information for after-discharge care    Destination     [facility]erred [facility] .   Service: Skilled Nursing Contact information: [place] [address] [phone]             Allergies  Allergen Reactions  . Iodine Other (See Comments)    Shortness of breath, swelling and hives  . Shrimp [Shellfish Allergy] Other (See Comments)    SWELLING    HIVES    SHORTNESS OF BREATH  . Tetracycline Rash    Consultations:  Cardio  nephro  ID   Procedures/Studies: CT PELVIS WO CONTRAST  Result Date: 09/24/2019 CLINICAL DATA:  Infected presacral wound EXAM: CT PELVIS WITHOUT CONTRAST TECHNIQUE: Multidetector CT imaging of the pelvis was performed following the standard protocol without intravenous contrast. Sagittal and coronal MPR images reconstructed from axial data set. COMPARISON:  06/22/2019 FINDINGS: Urinary Tract: Thick walled bladder which could represent chronic cystitis or chronic bladder outlet obstruction. No ureteral dilatation. However, 4 mm calculus  is identified within the distal LEFT ureter image 30. Bowel: Prior appendectomy. Double-barrel colostomy LEFT lower quadrant. Question mild rectal wall thickening. Remaining visualized bowel loops unremarkable. Vascular/Lymphatic: Atherosclerotic calcifications aorta and iliac arteries. No adenopathy. Reproductive: Mild prostatic enlargement. Unremarkable seminal vesicles. Other:  No free intraperitoneal air or fluid. Musculoskeletal: Large presacral decubitus ulcer with exposed distal sacrum in wound. Bone destruction of distal sacrum and coccyx consistent with osteomyelitis progressive since prior study. Mild chronic sclerosis within S1 segment of sacrum unchanged. IMPRESSION: Large presacral decubitus ulcer with progressive bone destruction of the distal sacrum and coccyx consistent with osteomyelitis. Chronic nonspecific sclerosis within S1 segment of sacrum. Prostatic enlargement with mild bladder wall thickening which could represent muscular  hypertrophy from chronic bladder outlet obstruction or from chronic cystitis. Nonobstructing 4 mm calculus distal LEFT ureter. Double-barrel colostomy LEFT lower quadrant. Electronically Signed   By: Lavonia Dana M.D.   On: 09/24/2019 09:38   US RENAL  Result Date: 10/11/2019 CLINICAL DATA:  Acute kidney injury EXAM: RENAL ULTRASOUND COMPARISON:  November 03, 2018 FINDINGS: Right Kidney: Renal measurements: 12.8 x 6.4 x 5.4 cm = volume: 234.4 mL . Echogenicity and renal cortical thickness are within normal limits. No mass, perinephric fluid, or hydronephrosis visualized. No sonographically demonstrable calculus or ureterectasis. Left Kidney: Renal measurements: 13.4 x 5.2 x 6.5 cm = volume: 238.4 mL. Echogenicity and renal cortical thickness are within normal limits. No mass, perinephric fluid, or hydronephrosis visualized. No sonographically demonstrable calculus or ureterectasis. Bladder: Decompressed with Foley catheter and cannot be assessed. Other: None. IMPRESSION: Normal appearing kidneys bilaterally.  Urinary bladder decompressed. Electronically Signed   By: Lowella Grip III M.D.   On: 10/11/2019 15:23   DG Chest Port 1 View  Result Date: 10/01/2019 CLINICAL DATA:  PICC line placement EXAM: PORTABLE CHEST 1 VIEW COMPARISON:  09/23/2019 FINDINGS: Right PICC line is been placed with the tip in the SVC. Left AICD is unchanged. Cardiomegaly. No confluent opacities or effusions. No acute bony abnormality. IMPRESSION: Right PICC line tip in the SVC. Cardiomegaly. No acute cardiopulmonary disease. Electronically Signed   By: Rolm Baptise M.D.   On: 10/01/2019 12:13   DG Chest Portable 1 View  Result Date: 09/23/2019 CLINICAL DATA:  69 year old male with leukocytosis EXAM: PORTABLE CHEST 1 VIEW COMPARISON:  Chest radiograph dated 07/15/2019. FINDINGS: Faint right lung base density may represent atelectasis. Trace right pleural effusion is not excluded. Clinical correlation is recommended. No pneumothorax.  Borderline cardiomegaly. There is improved cardiac size since the prior radiograph. Left pectoral AICD device. No acute osseous pathology. IMPRESSION: 1. Minimal density at the right lung base may represent atelectasis or trace pleural effusion. Clinical correlation is recommended. 2. Improved cardiac size since the prior radiograph. Electronically Signed   By: Anner Crete M.D.   On: 09/23/2019 19:25   ECHOCARDIOGRAM COMPLETE  Result Date: 09/28/2019    ECHOCARDIOGRAM REPORT   Patient Name:   KOEHN SALEHI Date of Exam: 09/28/2019 Medical Rec #:  [id number]        Height:       71.0 in Accession #:    [id number]       Weight:       240.0 lb Date of Birth:  September 18, 1950        BSA:          2.278 m Patient Age:    83 years         BP:  107/50 mmHg Patient Gender: M                HR:           83 bpm. Exam Location:  [facility] Procedure: 2D Echo Indications:     BACTEREMIA 790.7/R78.81  History:         Patient has prior history of Echocardiogram examinations, most                  recent 06/25/2019. CAD, Signs/Symptoms:Syncope; Risk                  Factors:Dyslipidemia, Diabetes and Hypertension.  Sonographer:     Avanell Shackleton Referring Phys:  [id number] Tsosie Billing Diagnosing Phys: Ida Rogue MD  Sonographer Comments: Technically difficult study due to poor echo windows, suboptimal parasternal window, suboptimal apical window and Technically challenging study due to limited acoustic windows. THERE IS ALOT OF ARTIFACT PRESENT DUE TO THE INTERFERENCE FROM THE EQUIPMENT IN THE COVID UNIT IMPRESSIONS  1. Left ventricular ejection fraction, by estimation, is 25 to 30%. The left ventricle has severely decreased function. The left ventricle demonstrates regional wall motion abnormalities , anterior , anterospetal and apical wall hypokinesis.  2. Right ventricular systolic function is normal. The right ventricular size is normal. There is mildly elevated pulmonary artery systolic pressure.  3.  Left atrial size was mildly dilated.  4. No vegetation noted. FINDINGS  Left Ventricle: Left ventricular ejection fraction, by estimation, is 25 to 30%. The left ventricle has severely decreased function. The left ventricle demonstrates regional wall motion abnormalities. The left ventricular internal cavity size was normal  in size. There is no left ventricular hypertrophy. Left ventricular diastolic parameters are indeterminate. Right Ventricle: The right ventricular size is normal. No increase in right ventricular wall thickness. Right ventricular systolic function is normal. There is mildly elevated pulmonary artery systolic pressure. The tricuspid regurgitant velocity is 2.66  m/s, and with an assumed right atrial pressure of 10 mmHg, the estimated right ventricular systolic pressure is 83.4 mmHg. Left Atrium: Left atrial size was mildly dilated. Right Atrium: Right atrial size was normal in size. Pericardium: Trivial pericardial effusion is present. Mitral Valve: The mitral valve is normal in structure and function. Normal mobility of the mitral valve leaflets. No evidence of mitral valve regurgitation. No evidence of mitral valve stenosis. Tricuspid Valve: The tricuspid valve is normal in structure. Tricuspid valve regurgitation is not demonstrated. No evidence of tricuspid stenosis. Aortic Valve: The aortic valve is normal in structure and function. Aortic valve regurgitation is not visualized. No aortic stenosis is present. Pulmonic Valve: The pulmonic valve was normal in structure. Pulmonic valve regurgitation is not visualized. No evidence of pulmonic stenosis. Aorta: The aortic root is normal in size and structure. Venous: The inferior vena cava is normal in size with greater than 50% respiratory variability, suggesting right atrial pressure of 3 mmHg. IAS/Shunts: No atrial level shunt detected by color flow Doppler.  LEFT VENTRICLE PLAX 2D LVIDd:         5.07 cm LVIDs:         4.27 cm LV PW:          0.81 cm LV IVS:        1.04 cm  LEFT ATRIUM         Index LA diam:    4.00 cm 1.76 cm/m   AORTA Ao Root diam: 4.70 cm TRICUSPID VALVE TR Peak grad:   28.3 mmHg TR Vmax:  266.00 cm/s Ida Rogue MD Electronically signed by Ida Rogue MD Signature Date/Time: 09/28/2019/2:38:15 PM    Final    DG HIP UNILAT WITH PELVIS 2-3 VIEWS LEFT  Result Date: 10/01/2019 CLINICAL DATA:  Left hip pain without known injury. EXAM: DG HIP (WITH OR WITHOUT PELVIS) 2-3V LEFT COMPARISON:  None. FINDINGS: There is no evidence of hip fracture or dislocation. There is no evidence of arthropathy or other focal bone abnormality. IMPRESSION: Negative. Electronically Signed   By: Marijo Conception M.D.   On: 10/01/2019 16:27   [place] EKG SITE RITE  Result Date: 09/30/2019 If Site Rite image not attached, placement could not be confirmed due to current cardiac rhythm.      Subjective: Pt c/o fatigue   Discharge Exam: Vitals:   10/23/19 1046 10/23/19 1221  BP: 94/78 105/74  Pulse:  83  Resp:  16  Temp:  98 F (36.7 C)  SpO2:  99%   Vitals:   10/23/19 0949 10/23/19 0953 10/23/19 1046 10/23/19 1221  BP: (!) 88/69 91/62 94/78  105/74  Pulse:    83  Resp:    16  Temp:    98 F (36.7 C)  TempSrc:      SpO2:    99%  Weight:      Height:        General: Pt is alert, awake, not in acute distress Cardiovascular: irregularly irregular, no rubs, no gallops Respiratory: diminished breath sounds b/l, no wheezing, no rhonchi Abdominal: Soft, NT, obese, bowel sounds + Extremities:  no cyanosis    The results of significant diagnostics from this hospitalization (including imaging, microbiology, ancillary and laboratory) are listed below for reference.     Microbiology: No results found for this or any previous visit (from the past 240 hour(s)).   Labs: BNP (last 3 results) Recent Labs    05/18/19 1536 06/12/19 0100 10/11/19 0933  BNP 721.0* 731.0* 3,557.3*   Basic Metabolic Panel: Recent Labs   Lab 10/18/19 0423 10/18/19 0423 10/19/19 2202 10/20/19 0432 10/21/19 0519 10/22/19 0443 10/23/19 0500  NA 142   < > 141 137 140 143 139  K 3.3*   < > 3.8 3.5 3.8 3.9 3.5  CL 101   < > 104 101 105 107 104  CO2 29   < > 28 28 27 27 27   GLUCOSE 112*   < > 93 124* 122* 107* 102*  BUN 40*   < > 30* 29* 29* 28* 29*  CREATININE 2.19*   < > 1.99* 1.89* 1.82* 1.76* 1.69*  CALCIUM 7.6*   < > 7.9* 7.9* 8.0* 8.2* 8.1*  MG 1.6*   < > 1.7 1.6* 1.8 1.6* 1.8  PHOS 1.6*  --  2.8 2.7  --  2.2* 2.8   < > = values in this interval not displayed.   Liver Function Tests: No results for input(s): AST, ALT, ALKPHOS, BILITOT, PROT, ALBUMIN in the last 168 hours. No results for input(s): LIPASE, AMYLASE in the last 168 hours. No results for input(s): AMMONIA in the last 168 hours. CBC: Recent Labs  Lab 10/18/19 0423 10/19/19 0616 10/21/19 0519 10/22/19 0443 10/23/19 0500  WBC 11.4* 12.8* 13.2* 14.0* 13.4*  NEUTROABS 7.7 8.7* 8.7* 9.6* 9.1*  HGB 6.7* 8.3* 8.0* 8.2* 8.3*  HCT 21.6* 25.3* 24.7* 26.0* 26.0*  MCV 86.4 86.1 86.7 87.8 85.8  PLT 120* 155 252 284 284   Cardiac Enzymes: Recent Labs  Lab 10/17/19 0514 10/20/19 0432  CKTOTAL 22* 27*  BNP: Invalid input(s): POCBNP CBG: No results for input(s): GLUCAP in the last 168 hours. D-Dimer No results for input(s): DDIMER in the last 72 hours. Hgb A1c No results for input(s): HGBA1C in the last 72 hours. Lipid Profile No results for input(s): CHOL, HDL, LDLCALC, TRIG, CHOLHDL, LDLDIRECT in the last 72 hours. Thyroid function studies No results for input(s): TSH, T4TOTAL, T3FREE, THYROIDAB in the last 72 hours.  Invalid input(s): FREET3 Anemia work up No results for input(s): VITAMINB12, FOLATE, FERRITIN, TIBC, IRON, RETICCTPCT in the last 72 hours. Urinalysis    Component Value Date/Time   COLORURINE YELLOW (A) 10/12/2019 1145   APPEARANCEUR CLOUDY (A) 10/12/2019 1145   LABSPEC 1.011 10/12/2019 1145   PHURINE 5.0 10/12/2019  1145   GLUCOSEU NEGATIVE 10/12/2019 1145   HGBUR NEGATIVE 10/12/2019 1145   BILIRUBINUR NEGATIVE 10/12/2019 1145   KETONESUR NEGATIVE 10/12/2019 1145   PROTEINUR 100 (A) 10/12/2019 1145   UROBILINOGEN 1.0 11/27/2013 1017   NITRITE NEGATIVE 10/12/2019 1145   LEUKOCYTESUR LARGE (A) 10/12/2019 1145   Sepsis Labs Invalid input(s): PROCALCITONIN,  WBC,  LACTICIDVEN Microbiology No results found for this or any previous visit (from the past 240 hour(s)).   Time coordinating discharge: Over 30 minutes  SIGNED:   Wyvonnia Dusky, MD  [facility] 10/23/2019, 1:10 PM Pager   If 7PM-7AM, please contact night-coverage [email or web address]

## 2019-10-23 NOTE — TOC Transition Note (Deleted)
Transition of Care [facility]) - CM/SW Discharge Note   Patient Details  Name: Colton Lamb MRN: [id number] Date of Birth: 07/15/1951  [facility] CM/SW Contact:  Eileen Stanford, LCSW Phone Number: 10/23/2019, 9:12 AM   Clinical Narrative:   Clinical Social Worker facilitated patient discharge including contacting patient family and facility to confirm patient discharge plans.  Clinical information faxed to facility and family agreeable with plan.  CSW arranged ambulance transport via [facility] to [facility] (room 605[facility].  RN to call [phone]  for report prior to discharge.   Final next level of care: Skilled Nursing Facility Barriers to Discharge: No Barriers Identified   Patient Goals and CMS Choice Patient states their goals for this hospitalization and ongoing recovery are:: to get stronger   Choice offered to / list presented to : Patient  Discharge Placement              Patient chooses bed at: [facility] Patient to be transferred to facility by: [facility] Name of family member notified: Pt aware Patient and family notified of of transfer: 10/23/19  Discharge Plan and Services In-house Referral: Clinical Social Work   Post Acute Care Choice: [place]                               Social Determinants of Health ([place]) Interventions     Readmission Risk Interventions Readmission Risk Prevention Plan 10/14/2019 10/14/2019 07/18/2019  Transportation Screening Complete Complete Complete  Social Work Consult for [place] - - -  Medication Review Press photographer) Complete Complete Complete  PCP or Specialist appointment within 3-5 days of discharge Complete Complete (No Data)  [place] or Home Care Consult Complete Complete -  [place] or Home Care Consult Pt Refusal Comments - - return to SNF  SW Recovery Care/Counseling Consult Complete Complete -  Palliative Care Screening Not  Applicable Not Applicable -  Skilled Nursing Facility Not Applicable Not Applicable Complete  Some recent data might be hidden

## 2019-10-23 NOTE — Progress Notes (Signed)
Patient converted to AFIB RVR in 120's -130's.  BP running soft.  Gave patient his scheduled digoxin. No new orders.  Will Continue to monitor.

## 2019-10-23 NOTE — TOC Progression Note (Signed)
Transition of Care [facility]) - Progression Note    Patient Details  Name: OMAREE FUQUA MRN: [id number] Date of Birth: 17-Jul-1951  Transition of Care [name]) CM/SW [place], LCSW Phone Number: 10/23/2019, 9:09 AM  Clinical Narrative:   Payment has been made, pt can admit to [facility] today. MD aware via chat. Facility prepared for pt today.    Expected Discharge Plan: [place] Barriers to Discharge: Continued Medical Work up  Expected Discharge Plan and Services Expected Discharge Plan: [place] In-house Referral: Clinical Social Work   Post Acute Care Choice: [place] Living arrangements for the past 2 months: [place] Determinants of Health (SDOH) Interventions    Readmission Risk Interventions Readmission Risk Prevention Plan 10/14/2019 10/14/2019 07/18/2019  Transportation Screening Complete Complete Complete  Social Work Consult for [place] - - -  Medication Review Press photographer) Complete Complete Complete  PCP or Specialist appointment within 3-5 days of discharge Complete Complete (No Data)  [place] or [place] Complete Complete -  [place] or Home Care Consult Pt Refusal Comments - - return to SNF  SW Recovery Care/Counseling Consult Complete Complete -  Palliative Care Screening Not Applicable Not Applicable -  [place] Not Applicable Not Applicable Complete  Some recent data might be hidden

## 2019-10-24 ENCOUNTER — Ambulatory Visit (INDEPENDENT_AMBULATORY_CARE_PROVIDER_SITE_OTHER): Payer: Medicare Other | Admitting: *Deleted

## 2019-10-24 DIAGNOSIS — I255 Ischemic cardiomyopathy: Secondary | ICD-10-CM | POA: Diagnosis not present

## 2019-10-26 ENCOUNTER — Emergency Department (HOSPITAL_COMMUNITY): Payer: Medicare Other

## 2019-10-26 ENCOUNTER — Other Ambulatory Visit: Payer: Self-pay

## 2019-10-26 ENCOUNTER — Encounter (HOSPITAL_COMMUNITY): Payer: Self-pay | Admitting: *Deleted

## 2019-10-26 ENCOUNTER — Observation Stay (HOSPITAL_COMMUNITY)
Admission: EM | Admit: 2019-10-26 | Discharge: 2019-10-27 | Disposition: A | Discharge status: Skilled Nursing Facility | Payer: Medicare Other | Attending: Internal Medicine | Admitting: Internal Medicine

## 2019-10-26 DIAGNOSIS — Z8249 Family history of ischemic heart disease and other diseases of the circulatory system: Secondary | ICD-10-CM | POA: Insufficient documentation

## 2019-10-26 DIAGNOSIS — K219 Gastro-esophageal reflux disease without esophagitis: Secondary | ICD-10-CM | POA: Diagnosis not present

## 2019-10-26 DIAGNOSIS — E11622 Type 2 diabetes mellitus with other skin ulcer: Secondary | ICD-10-CM | POA: Diagnosis not present

## 2019-10-26 DIAGNOSIS — I480 Paroxysmal atrial fibrillation: Secondary | ICD-10-CM | POA: Diagnosis not present

## 2019-10-26 DIAGNOSIS — Z7901 Long term (current) use of anticoagulants: Secondary | ICD-10-CM | POA: Insufficient documentation

## 2019-10-26 DIAGNOSIS — Z8616 Personal history of COVID-19: Secondary | ICD-10-CM | POA: Diagnosis not present

## 2019-10-26 DIAGNOSIS — N179 Acute kidney failure, unspecified: Secondary | ICD-10-CM | POA: Insufficient documentation

## 2019-10-26 DIAGNOSIS — R079 Chest pain, unspecified: Secondary | ICD-10-CM | POA: Diagnosis present

## 2019-10-26 DIAGNOSIS — E1165 Type 2 diabetes mellitus with hyperglycemia: Secondary | ICD-10-CM | POA: Diagnosis present

## 2019-10-26 DIAGNOSIS — I4891 Unspecified atrial fibrillation: Secondary | ICD-10-CM | POA: Diagnosis present

## 2019-10-26 DIAGNOSIS — G4733 Obstructive sleep apnea (adult) (pediatric): Secondary | ICD-10-CM | POA: Insufficient documentation

## 2019-10-26 DIAGNOSIS — B965 Pseudomonas (aeruginosa) (mallei) (pseudomallei) as the cause of diseases classified elsewhere: Secondary | ICD-10-CM | POA: Insufficient documentation

## 2019-10-26 DIAGNOSIS — I2510 Atherosclerotic heart disease of native coronary artery without angina pectoris: Secondary | ICD-10-CM | POA: Insufficient documentation

## 2019-10-26 DIAGNOSIS — E876 Hypokalemia: Secondary | ICD-10-CM

## 2019-10-26 DIAGNOSIS — R7881 Bacteremia: Secondary | ICD-10-CM | POA: Diagnosis present

## 2019-10-26 DIAGNOSIS — E785 Hyperlipidemia, unspecified: Secondary | ICD-10-CM | POA: Insufficient documentation

## 2019-10-26 DIAGNOSIS — I255 Ischemic cardiomyopathy: Secondary | ICD-10-CM | POA: Diagnosis not present

## 2019-10-26 DIAGNOSIS — R0789 Other chest pain: Secondary | ICD-10-CM | POA: Diagnosis not present

## 2019-10-26 DIAGNOSIS — I5042 Chronic combined systolic (congestive) and diastolic (congestive) heart failure: Secondary | ICD-10-CM | POA: Insufficient documentation

## 2019-10-26 DIAGNOSIS — K5080 Crohn's disease of both small and large intestine without complications: Secondary | ICD-10-CM | POA: Diagnosis not present

## 2019-10-26 DIAGNOSIS — L89154 Pressure ulcer of sacral region, stage 4: Secondary | ICD-10-CM | POA: Diagnosis not present

## 2019-10-26 DIAGNOSIS — Z794 Long term (current) use of insulin: Secondary | ICD-10-CM | POA: Diagnosis not present

## 2019-10-26 DIAGNOSIS — I252 Old myocardial infarction: Secondary | ICD-10-CM | POA: Insufficient documentation

## 2019-10-26 DIAGNOSIS — Z881 Allergy status to other antibiotic agents status: Secondary | ICD-10-CM | POA: Diagnosis not present

## 2019-10-26 DIAGNOSIS — I5022 Chronic systolic (congestive) heart failure: Secondary | ICD-10-CM | POA: Diagnosis present

## 2019-10-26 DIAGNOSIS — Z6834 Body mass index (BMI) 34.0-34.9, adult: Secondary | ICD-10-CM | POA: Insufficient documentation

## 2019-10-26 DIAGNOSIS — G2581 Restless legs syndrome: Secondary | ICD-10-CM | POA: Insufficient documentation

## 2019-10-26 DIAGNOSIS — Z79899 Other long term (current) drug therapy: Secondary | ICD-10-CM | POA: Diagnosis not present

## 2019-10-26 DIAGNOSIS — IMO0002 Reserved for concepts with insufficient information to code with codable children: Secondary | ICD-10-CM | POA: Diagnosis present

## 2019-10-26 DIAGNOSIS — R072 Precordial pain: Secondary | ICD-10-CM

## 2019-10-26 DIAGNOSIS — D649 Anemia, unspecified: Secondary | ICD-10-CM | POA: Insufficient documentation

## 2019-10-26 DIAGNOSIS — I482 Chronic atrial fibrillation, unspecified: Secondary | ICD-10-CM | POA: Diagnosis present

## 2019-10-26 DIAGNOSIS — Z955 Presence of coronary angioplasty implant and graft: Secondary | ICD-10-CM | POA: Insufficient documentation

## 2019-10-26 DIAGNOSIS — E118 Type 2 diabetes mellitus with unspecified complications: Secondary | ICD-10-CM

## 2019-10-26 DIAGNOSIS — I110 Hypertensive heart disease with heart failure: Secondary | ICD-10-CM | POA: Diagnosis not present

## 2019-10-26 DIAGNOSIS — L89150 Pressure ulcer of sacral region, unstageable: Secondary | ICD-10-CM | POA: Diagnosis present

## 2019-10-26 DIAGNOSIS — U071 COVID-19: Secondary | ICD-10-CM | POA: Diagnosis present

## 2019-10-26 DIAGNOSIS — I4892 Unspecified atrial flutter: Secondary | ICD-10-CM | POA: Diagnosis not present

## 2019-10-26 DIAGNOSIS — Z933 Colostomy status: Secondary | ICD-10-CM | POA: Insufficient documentation

## 2019-10-26 DIAGNOSIS — Z95810 Presence of automatic (implantable) cardiac defibrillator: Secondary | ICD-10-CM | POA: Insufficient documentation

## 2019-10-26 LAB — LIPID PANEL
Cholesterol: 105 mg/dL (ref 0–200)
HDL: 25 mg/dL — ABNORMAL LOW (ref >40)
LDL Cholesterol: 57 mg/dL (ref 0–99)
Total CHOL/HDL Ratio: 4.2 RATIO
Triglycerides: 113 mg/dL (ref <150)
VLDL: 23 mg/dL (ref 0–40)

## 2019-10-26 LAB — BASIC METABOLIC PANEL
Anion gap: 13 (ref 5–15)
BUN: 19 mg/dL (ref 8–23)
CO2: 24 mmol/L (ref 22–32)
Calcium: 8.5 mg/dL — ABNORMAL LOW (ref 8.9–10.3)
Chloride: 101 mmol/L (ref 98–111)
Creatinine, Ser: 1.68 mg/dL — ABNORMAL HIGH (ref 0.61–1.24)
GFR calc Af Amer: 48 mL/min — ABNORMAL LOW (ref >60)
GFR calc non Af Amer: 41 mL/min — ABNORMAL LOW (ref >60)
Glucose, Bld: 94 mg/dL (ref 70–99)
Potassium: 3.1 mmol/L — ABNORMAL LOW (ref 3.5–5.1)
Sodium: 138 mmol/L (ref 135–145)

## 2019-10-26 LAB — TROPONIN I (HIGH SENSITIVITY)
Troponin I (High Sensitivity): 29 ng/L — ABNORMAL HIGH (ref <18)
Troponin I (High Sensitivity): 26 ng/L — ABNORMAL HIGH (ref <18)

## 2019-10-26 LAB — GLUCOSE, CAPILLARY
Glucose-Capillary: 110 mg/dL — ABNORMAL HIGH (ref 70–99)
Glucose-Capillary: 118 mg/dL — ABNORMAL HIGH (ref 70–99)

## 2019-10-26 LAB — CBG MONITORING, ED: Glucose-Capillary: 75 mg/dL (ref 70–99)

## 2019-10-26 LAB — BRAIN NATRIURETIC PEPTIDE: B Natriuretic Peptide: 558.1 pg/mL — ABNORMAL HIGH (ref 0.0–100.0)

## 2019-10-26 LAB — MRSA PCR SCREENING: MRSA by PCR: NEGATIVE

## 2019-10-26 LAB — DIGOXIN LEVEL: Digoxin Level: 0.4 ng/mL — ABNORMAL LOW (ref 0.8–2.0)

## 2019-10-26 LAB — HEPATIC FUNCTION PANEL
ALT: 10 U/L (ref 0–44)
AST: 25 U/L (ref 15–41)
Albumin: 2.2 g/dL — ABNORMAL LOW (ref 3.5–5.0)
Alkaline Phosphatase: 133 U/L — ABNORMAL HIGH (ref 38–126)
Bilirubin, Direct: <0.1 mg/dL (ref 0.0–0.2)
Total Bilirubin: 0.7 mg/dL (ref 0.3–1.2)
Total Protein: 6.6 g/dL (ref 6.5–8.1)

## 2019-10-26 LAB — SARS CORONAVIRUS 2 (TAT 6-24 HRS): SARS Coronavirus 2: NEGATIVE

## 2019-10-26 LAB — CBC
HCT: 26.8 % — ABNORMAL LOW (ref 39.0–52.0)
Hemoglobin: 8.3 g/dL — ABNORMAL LOW (ref 13.0–17.0)
MCH: 27.4 pg (ref 26.0–34.0)
MCHC: 31 g/dL (ref 30.0–36.0)
MCV: 88.4 fL (ref 80.0–100.0)
Platelets: 378 10*3/uL (ref 150–400)
RBC: 3.03 MIL/uL — ABNORMAL LOW (ref 4.22–5.81)
RDW: 18.6 % — ABNORMAL HIGH (ref 11.5–15.5)
WBC: 16.6 10*3/uL — ABNORMAL HIGH (ref 4.0–10.5)
nRBC: 0 % (ref 0.0–0.2)

## 2019-10-26 LAB — LIPASE, BLOOD: Lipase: 37 U/L (ref 11–51)

## 2019-10-26 MED ORDER — GERHARDT'S BUTT CREAM
TOPICAL_CREAM | Freq: Four times a day (QID) | CUTANEOUS | Status: DC
  Filled 2019-10-26 (×2): qty 1

## 2019-10-26 MED ORDER — ALBUTEROL SULFATE (2.5 MG/3ML) 0.083% IN NEBU: 2.5000 mg | INHALATION_SOLUTION | Freq: Four times a day (QID) | RESPIRATORY_TRACT | Status: DC | PRN

## 2019-10-26 MED ORDER — HYDROCODONE-ACETAMINOPHEN 5-325 MG PO TABS: 1.0000 | ORAL_TABLET | ORAL | Status: DC | PRN

## 2019-10-26 MED ORDER — ZOLPIDEM TARTRATE 5 MG PO TABS: 5.0000 mg | ORAL_TABLET | Freq: Every evening | ORAL | Status: DC | PRN

## 2019-10-26 MED ORDER — LIDOCAINE VISCOUS HCL 2 % MT SOLN
15.0000 mL | Freq: Once | OROMUCOSAL | Status: AC
  Administered 2019-10-26: 08:00:00 15 mL via ORAL
  Filled 2019-10-26: qty 15

## 2019-10-26 MED ORDER — CIPROFLOXACIN HCL 500 MG PO TABS
500.0000 mg | ORAL_TABLET | Freq: Two times a day (BID) | ORAL | Status: DC
  Administered 2019-10-26 – 2019-10-27 (×2): 500 mg via ORAL
  Filled 2019-10-26 (×2): qty 1

## 2019-10-26 MED ORDER — FENTANYL CITRATE (PF) 100 MCG/2ML IJ SOLN
25.0000 ug | Freq: Once | INTRAMUSCULAR | Status: AC
  Administered 2019-10-26: 25 ug via INTRAVENOUS
  Filled 2019-10-26: qty 2

## 2019-10-26 MED ORDER — ACETAMINOPHEN 650 MG RE SUPP: 650.0000 mg | Freq: Four times a day (QID) | RECTAL | Status: DC | PRN

## 2019-10-26 MED ORDER — BALSALAZIDE DISODIUM 750 MG PO CAPS: 750.0000 mg | ORAL_CAPSULE | Freq: Three times a day (TID) | ORAL | Status: DC

## 2019-10-26 MED ORDER — NEPRO/CARBSTEADY PO LIQD: 237.0000 mL | Freq: Two times a day (BID) | ORAL | Status: DC

## 2019-10-26 MED ORDER — ALUM & MAG HYDROXIDE-SIMETH 200-200-20 MG/5ML PO SUSP
30.0000 mL | ORAL | Status: DC | PRN
  Administered 2019-10-26: 21:00:00 30 mL via ORAL
  Filled 2019-10-26: qty 30

## 2019-10-26 MED ORDER — ONDANSETRON HCL 4 MG/2ML IJ SOLN: 4.0000 mg | Freq: Four times a day (QID) | INTRAMUSCULAR | Status: DC | PRN

## 2019-10-26 MED ORDER — ATORVASTATIN CALCIUM 80 MG PO TABS
80.0000 mg | ORAL_TABLET | Freq: Every day | ORAL | Status: DC
  Administered 2019-10-26 – 2019-10-27 (×2): 80 mg via ORAL
  Filled 2019-10-26 (×2): qty 1

## 2019-10-26 MED ORDER — HYDRALAZINE HCL 25 MG PO TABS: 25.0000 mg | ORAL_TABLET | Freq: Four times a day (QID) | ORAL | Status: DC | PRN

## 2019-10-26 MED ORDER — DIGOXIN 0.0625 MG HALF TABLET
0.0625 mg | ORAL_TABLET | Freq: Every day | ORAL | Status: DC
  Administered 2019-10-26 – 2019-10-27 (×2): 0.0625 mg via ORAL
  Filled 2019-10-26 (×2): qty 1

## 2019-10-26 MED ORDER — DOCUSATE SODIUM 100 MG PO CAPS
100.0000 mg | ORAL_CAPSULE | Freq: Two times a day (BID) | ORAL | Status: DC
  Administered 2019-10-26 – 2019-10-27 (×2): 100 mg via ORAL
  Filled 2019-10-26 (×2): qty 1

## 2019-10-26 MED ORDER — SODIUM CHLORIDE 0.9% FLUSH
3.0000 mL | Freq: Once | INTRAVENOUS | Status: AC
  Administered 2019-10-26: 07:00:00 3 mL via INTRAVENOUS

## 2019-10-26 MED ORDER — INSULIN ASPART 100 UNIT/ML ~~LOC~~ SOLN: 0.0000 [IU] | Freq: Three times a day (TID) | SUBCUTANEOUS | Status: DC

## 2019-10-26 MED ORDER — ALUM & MAG HYDROXIDE-SIMETH 200-200-20 MG/5ML PO SUSP
30.0000 mL | Freq: Once | ORAL | Status: AC
  Administered 2019-10-26: 08:00:00 30 mL via ORAL
  Filled 2019-10-26: qty 30

## 2019-10-26 MED ORDER — BISACODYL 5 MG PO TBEC: 5.0000 mg | DELAYED_RELEASE_TABLET | Freq: Every day | ORAL | Status: DC | PRN

## 2019-10-26 MED ORDER — ASCORBIC ACID 500 MG PO TABS
500.0000 mg | ORAL_TABLET | Freq: Two times a day (BID) | ORAL | Status: DC
  Administered 2019-10-26 – 2019-10-27 (×2): 500 mg via ORAL
  Filled 2019-10-26 (×2): qty 1

## 2019-10-26 MED ORDER — NITROGLYCERIN 0.4 MG SL SUBL
0.4000 mg | SUBLINGUAL_TABLET | Freq: Once | SUBLINGUAL | Status: AC
  Administered 2019-10-26: 07:00:00 0.4 mg via SUBLINGUAL
  Filled 2019-10-26: qty 1

## 2019-10-26 MED ORDER — TAMSULOSIN HCL 0.4 MG PO CAPS
0.4000 mg | ORAL_CAPSULE | Freq: Every day | ORAL | Status: DC
  Administered 2019-10-26 – 2019-10-27 (×2): 0.4 mg via ORAL
  Filled 2019-10-26 (×2): qty 1

## 2019-10-26 MED ORDER — FERROUS GLUCONATE 324 (38 FE) MG PO TABS
324.0000 mg | ORAL_TABLET | Freq: Every day | ORAL | Status: DC
  Administered 2019-10-27: 09:00:00 324 mg via ORAL
  Filled 2019-10-26: qty 1

## 2019-10-26 MED ORDER — FINASTERIDE 5 MG PO TABS
5.0000 mg | ORAL_TABLET | Freq: Every day | ORAL | Status: DC
  Administered 2019-10-26 – 2019-10-27 (×2): 5 mg via ORAL
  Filled 2019-10-26 (×2): qty 1

## 2019-10-26 MED ORDER — POTASSIUM CHLORIDE 10 MEQ/100ML IV SOLN
10.0000 meq | Freq: Once | INTRAVENOUS | Status: AC
  Administered 2019-10-26: 09:00:00 10 meq via INTRAVENOUS
  Filled 2019-10-26: qty 100

## 2019-10-26 MED ORDER — FLORANEX PO PACK
1.0000 g | PACK | Freq: Three times a day (TID) | ORAL | Status: DC
  Administered 2019-10-26 – 2019-10-27 (×4): 1 g via ORAL
  Filled 2019-10-26 (×4): qty 1

## 2019-10-26 MED ORDER — ACETAMINOPHEN 325 MG PO TABS: 650.0000 mg | ORAL_TABLET | Freq: Four times a day (QID) | ORAL | Status: DC | PRN

## 2019-10-26 MED ORDER — POLYETHYLENE GLYCOL 3350 17 G PO PACK: 17.0000 g | PACK | Freq: Every day | ORAL | Status: DC | PRN

## 2019-10-26 MED ORDER — COLLAGENASE 250 UNIT/GM EX OINT: 1.0000 "application " | TOPICAL_OINTMENT | Freq: Every day | CUTANEOUS | Status: DC

## 2019-10-26 MED ORDER — CHLORHEXIDINE GLUCONATE CLOTH 2 % EX PADS
6.0000 | MEDICATED_PAD | Freq: Every day | CUTANEOUS | Status: DC
  Administered 2019-10-26: 22:00:00 6 via TOPICAL

## 2019-10-26 MED ORDER — MORPHINE SULFATE (PF) 2 MG/ML IV SOLN: 2.0000 mg | INTRAVENOUS | Status: DC | PRN

## 2019-10-26 MED ORDER — RIVAROXABAN 20 MG PO TABS
20.0000 mg | ORAL_TABLET | Freq: Every day | ORAL | Status: DC
  Administered 2019-10-26 – 2019-10-27 (×2): 20 mg via ORAL
  Filled 2019-10-26 (×2): qty 1

## 2019-10-26 MED ORDER — SACCHAROMYCES BOULARDII 250 MG PO CAPS
250.0000 mg | ORAL_CAPSULE | Freq: Three times a day (TID) | ORAL | Status: DC
  Administered 2019-10-26 – 2019-10-27 (×4): 250 mg via ORAL
  Filled 2019-10-26 (×4): qty 1

## 2019-10-26 MED ORDER — PANTOPRAZOLE SODIUM 40 MG PO TBEC
40.0000 mg | DELAYED_RELEASE_TABLET | Freq: Every day | ORAL | Status: DC
  Administered 2019-10-26 – 2019-10-27 (×2): 40 mg via ORAL
  Filled 2019-10-26 (×2): qty 1

## 2019-10-26 MED ORDER — RENA-VITE PO TABS
1.0000 | ORAL_TABLET | Freq: Every day | ORAL | Status: DC
  Administered 2019-10-26 – 2019-10-27 (×2): 1 via ORAL
  Filled 2019-10-26 (×3): qty 1

## 2019-10-26 MED ORDER — MORPHINE SULFATE (PF) 4 MG/ML IV SOLN: 4.0000 mg | Freq: Once | INTRAVENOUS | Status: DC

## 2019-10-26 MED ORDER — SODIUM CHLORIDE 0.9 % IV BOLUS (SEPSIS)
500.0000 mL | Freq: Once | INTRAVENOUS | Status: AC
  Administered 2019-10-26: 07:00:00 500 mL via INTRAVENOUS

## 2019-10-26 MED ORDER — MIDODRINE HCL 5 MG PO TABS
10.0000 mg | ORAL_TABLET | Freq: Three times a day (TID) | ORAL | Status: DC
  Administered 2019-10-26 – 2019-10-27 (×4): 10 mg via ORAL
  Filled 2019-10-26 (×6): qty 2

## 2019-10-26 MED ORDER — INSULIN GLARGINE 100 UNIT/ML ~~LOC~~ SOLN
5.0000 [IU] | Freq: Every day | SUBCUTANEOUS | Status: DC
  Administered 2019-10-26 – 2019-10-27 (×2): 5 [IU] via SUBCUTANEOUS
  Filled 2019-10-26 (×2): qty 0.05

## 2019-10-26 MED ORDER — ONDANSETRON HCL 4 MG PO TABS: 4.0000 mg | ORAL_TABLET | Freq: Four times a day (QID) | ORAL | Status: DC | PRN

## 2019-10-26 NOTE — ED Notes (Signed)
Ostomy bag changed and stoma cleaned

## 2019-10-26 NOTE — ED Triage Notes (Signed)
Pt from [facility], c/o chest pain started 1.5 hours ago while at rest. L sided chest pain radiating into L arm and low back. 313m ASA and nitro x 2 given pta. Pt dropped blood pressure from 107/69 to 69/38 after nitro. 2054mNS given enroute. No improvement of pain after nitro.Hx of multiple MI, defibrillator to L chest, colostomy top RLQ. 22g IV to L index finger.

## 2019-10-26 NOTE — ED Provider Notes (Signed)
[place] EMERGENCY DEPARTMENT Provider Note   CSN: [id number] Arrival date & time: 10/26/19  4098     History Chief Complaint  Patient presents with  . Chest Pain    Colton Lamb is a 69 y.o. male with PMHx HTN, HLD, GERD, CAD s/p MI and stenting, diabetes, A fib on Xarelto, CHF with EF 20-25%, Crohn's ileocolitis s/p colostomy bag who presents to the ED today via EMS complaining of sudden onset, constant, gradually improving, stabbing, 5/10, left sided chest pain that began around 4 AM this morning. Pt reports the pain radiated into his left upper back and down his LUE. He was given 325 mg Aspirin and 2 rounds of NTG with EMS. He reports his back pain and LUE pain resolved after the NTG however he continues to have chest pain. The pain was initially a 10/10 but is now a 5/10. Per triage report pt's blood pressure decreased from 107/69 to 69/38 after NTG. Pt reports this chest pain feels similar to previous heart attacks. He also reports mild SOB. Denies fevers, chills, diaphoresis, nausea, vomiting, or any other associated symptoms.   Per chart review pt recently discharged from the hospital on 3/24 after increase in colostomy output with concern from [facility] for C diff; negative PCR. Found to have AKI with creatinine 4.43 and admitted with medsurg following. During hospitalization pt developed a fib with RVR however hypotensive; started on digoxin.   He was also recently admitted from 2/22-3/3 for pseudomonas bacteremia s/2 chronic osteomyelitis.   The history is provided by the patient and medical records.       Past Medical History:  Diagnosis Date  . Atrial flutter ([place])    a. s/p Cardioversion 11/22/13, on amiodarone and Xarelto.  . Chronic osteomyelitis ([place]) 06/30/2019   s/p colostomy  . Chronic systolic heart failure ([place])    a. 10/2013 EF 20-25%, grade III DD, RV mildly dilated and sys fx mild/mod reduced;  b. 01/2014 Echo: EF 30-35%, gr3 DD, mod  dil LA. c. 07/2019 EF 20-25%  . Coronary artery disease    a. s/p MI 2007/2015;  b. s/p prior PCI to the LAD/LCX/PDA/PL;  c. 2008: s/p Cypher DES to the OM.  Marland Kitchen Crohn's ileocolitis ([place])   . GERD (gastroesophageal reflux disease)   . Hx of adenomatous colonic polyps 11/2003  . Hyperlipidemia   . Hypertension   . Ischemic cardiomyopathy    s. 01/2014 s/p MDT [id number] [name] XT DR single lead AICD.  Marland Kitchen Obesity   . Paroxysmal atrial fibrillation (HCC)    a. CHA2DS2VASc = (CHF, HTN, agex1, DM)  . Sleep apnea   . Syncope    a.  11/2013 in setting of volume depletion and bradycardia due to dig toxicity   . Type II diabetes mellitus [facility])     Patient Active Problem List   Diagnosis Date Noted  . Diarrhea 10/11/2019  . COVID-19 virus infection 10/11/2019  . Thrombocytopenia ([place]) 10/11/2019  . Sacral decubitus ulcer, stage IV ([place]) 10/11/2019  . Encounter for competency evaluation   . Acute respiratory distress syndrome (ARDS) due to COVID-19 virus ([place]) 09/29/2019  . Bacteremia due to Pseudomonas 09/29/2019  . Goals of care, counseling/discussion   . Palliative care by specialist   . HCAP (healthcare-associated pneumonia) 09/23/2019  . Colostomy in place [facility]) 09/23/2019  . Bleeding 07/11/2019  . Chronic osteomyelitis ([place]) 06/30/2019  . PICC (peripherally inserted central catheter) flush   . Normocytic anemia 06/21/2019  . Decubitus  ulcer of sacral region, unstageable ([place]) 06/19/2019  . Hypotension 06/19/2019  . Anemia   . Acute respiratory failure ([place]) 06/12/2019  . Chronic systolic CHF (congestive heart failure) ([place])   . Acute blood loss anemia   . Pressure injury of skin 03/17/2019  . Acute renal failure (ARF) ([place])   . Empyema lung ([place])   . Shortness of breath   . Pleural effusion on right   . Sepsis ([place]) 03/04/2019  . Restless legs syndrome (RLS) 06/16/2017  . Hypertension, essential 06/16/2017  . CAD in native artery 04/27/2017  . Hydrocephalus ([place]) 09/08/2016  .  Dizziness and giddiness   . Elevated troponin 09/06/2016  . Type II diabetes mellitus ([place])   . Ischemic cardiomyopathy   . Hyponatremia 07/01/2015  . Diabetes mellitus type 2, uncontrolled, with complications ([place]) 84/13/2440  . 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 OSA (obstructive sleep apnea) 12/20/2013  . Morbid obesity ([place]) 12/20/2013  . AKI (acute kidney injury) - Creatinine improved at d/c 12/14/2013  . Elevated TSH - will need f/u TFTs with PCP in 3-4 weeks 12/14/2013  . Junctional bradycardia - resolved 12/14/2013  . Syncope - due to bradycardia in setting of Digoxin Toxicity 12/11/2013  . Acute on chronic HFrEF (heart failure with reduced ejection fraction) ([place]) 12/06/2013  . At risk for sudden cardiac death - on LifeVest 15-Dec-2013  . Acute on chronic combined systolic and diastolic CHF (congestive heart failure) ([place]) 2013/12/15  . Cardiomyopathy, ischemic 12/15/13  . Atrial flutter ([place]) 11/22/2013  . NSTEMI (non-ST elevated myocardial infarction) ([place]) 11/22/2013  . Long term current use of anticoagulant 10/19/2010  . Hyperlipidemia 05/06/2010  . Edema 05/06/2010  . CAD S/P percutaneous coronary angioplasty - multiple PCIs 11/11/2008  . Atrial fibrillation ([place]) 11/11/2008  . GERD 11/11/2008  . [place] INTESTINE 11/11/2008  . COLONIC POLYPS, HX OF 11/11/2008  . [place] ALLERGY 11/11/2008    Past Surgical History:  Procedure Laterality Date  . ATRIAL FLUTTER ABLATION N/A 04/16/2014   Procedure: ATRIAL FLUTTER ABLATION;  Surgeon: Evans Lance, MD;  Location: [facility] CATH LAB;  Service: Cardiovascular;  Laterality: N/A;  . CARDIAC CATHETERIZATION  10/2013  . CARDIAC DEFIBRILLATOR PLACEMENT  04/16/2014   Medtronic Evira device  . CARDIAC ELECTROPHYSIOLOGY STUDY AND ABLATION  04/16/2014   atrial flutter ablation  . CARDIOVERSION N/A 03/05/2014   Procedure: CARDIOVERSION;  Surgeon: Jolaine Artist, MD;  Location[facility]  ENDOSCOPY;  Service: Cardiovascular;  Laterality: N/A;  . CATARACT EXTRACTION W/PHACO Right 01/04/2017   Procedure: CATARACT EXTRACTION PHACO AND INTRAOCULAR LENS PLACEMENT ([place])  Right Diabetic Complicated;  Surgeon: Leandrew Koyanagi, MD;  Location: [place];  Service: Ophthalmology;  Laterality: Right;  Diabetic  . CATARACT EXTRACTION W/PHACO Left 02/08/2017   Procedure: CATARACT EXTRACTION PHACO AND INTRAOCULAR LENS PLACEMENT ([place]) left diabetic;  Surgeon: Leandrew Koyanagi, MD;  Location: [place];  Service: Ophthalmology;  Laterality: Left;  Diabetic - oral meds sleep apnea  . CORONARY ANGIOPLASTY WITH STENT PLACEMENT  2007; 2008 X 2   "1+1 ~ 1"  . FOOT SURGERY Left    bone spur  . HYDROCELE EXCISION Bilateral   . 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 IMPLANTABLE CARDIOVERTER DEFIBRILLATOR IMPLANT N/A 04/16/2014   Procedure: IMPLANTABLE CARDIOVERTER DEFIBRILLATOR IMPLANT;  Surgeon: Evans Lance, MD;  Location: [facility] CATH LAB;  Service: Cardiovascular;  Laterality: N/A;  . LEFT HEART CATHETERIZATION WITH CORONARY ANGIOGRAM N/A 11/22/2013   Procedure: LEFT  HEART CATHETERIZATION WITH CORONARY ANGIOGRAM;  Surgeon: Sinclair Grooms, MD;  Location[facility] C[facility] LAB;  Service: Cardiovascular;  Laterality: N/A;  . TRANSVERSE LOOP COLOSTOMY N/A 07/03/2019   Procedure: TRANSVERSE LOOP COLOSTOMY;  Surgeon: Ronny Bacon, MD;  Location: [facility] ORS;  Service: General;  Laterality: N/A;       Family History  Problem Relation Age of Onset  . Breast cancer Mother   . Heart disease Father   . Heart attack Father   . Colon cancer Neg Hx     Social History   Tobacco Use  . Smoking status: Never Smoker  . Smokeless tobacco: Never Used  Substance Use Topics  . Alcohol use: No  . Drug use: No    Home Medications Prior to Admission medications   Medication Sig Start Date End Date Taking? Authorizing Provider  acetaminophen (TYLENOL) 325 MG  tablet Take 1-2 tablets (325-650 mg total) by mouth every 4 (four) hours as needed for mild pain. 04/17/14   [name], NP  Albuterol Sulfate 108 (90 Base) MCG/ACT AEPB Inhale 1 puff into the lungs every 6 (six) hours as needed (shortness of breath).     [provider]  atorvastatin (LIPITOR) 80 MG tablet TAKE 1 TABLET BY MOUTH EVERY DAY Patient taking differently: Take 80 mg by mouth daily.  03/08/19   Minna Merritts, MD  balsalazide (COLAZAL) 750 MG capsule TAKE 1 CAPSULE (750 MG TOTAL) BY MOUTH 3 (THREE) TIMES DAILY. Patient taking differently: Take 750 mg by mouth 3 (three) times daily.  11/28/18   Ladene Artist, MD  ciprofloxacin (CIPRO) 500 MG tablet Take 1 tablet (500 mg total) by mouth 2 (two) times daily for 12 days. 10/23/19 11/04/19  Wyvonnia Dusky, MD  collagenase (SANTYL) ointment Apply 1 application topically daily. (apply to sacrum)    [provider]  digoxin 62.5 MCG TABS Take 0.0625 mg by mouth daily. 10/24/19 11/23/19  Wyvonnia Dusky, MD  ferrous gluconate (FERGON) 324 MG tablet Take 1 tablet (324 mg total) by mouth daily with breakfast. 10/24/19 11/23/19  Wyvonnia Dusky, MD  finasteride (PROSCAR) 5 MG tablet Take 1 tablet (5 mg total) by mouth daily. 03/20/19   Loletha Grayer, MD  insulin aspart (NOVOLOG) 100 UNIT/ML injection Inject 5 Units into the skin 3 (three) times daily with meals. Patient taking differently: Inject 5 Units into the skin 3 (three) times daily before meals.  03/19/19   Loletha Grayer, MD  ipratropium-albuterol (DUONEB) 0.5-2.5 (3) MG/3ML SOLN Take 3 mLs by nebulization every 4 (four) hours as needed (shortness of breath or wheezing).    [provider]  lactobacillus (FLORANEX/LACTINEX) PACK Take 1 packet (1 g total) by mouth 3 (three) times daily with meals. 03/19/19   Loletha Grayer, MD  midodrine (PROAMATINE) 10 MG tablet Take 1 tablet (10 mg total) by mouth 3 (three) times daily. 04/26/19   Max Sane, MD   Multiple Vitamins-Minerals (DECUBI-VITE PO) Take 1 capsule by mouth daily.     [provider]  Nutritional Supplements (FEEDING SUPPLEMENT, NEPRO CARB STEADY,) LIQD Take 237 mLs by mouth 2 (two) times daily between meals. 03/19/19   Loletha Grayer, MD  omeprazole (PRILOSEC) 20 MG capsule Take 20 mg by mouth daily.     [provider]  ondansetron (ZOFRAN) 4 MG tablet Take 4 mg by mouth every 8 (eight) hours as needed for nausea or vomiting.    [provider]  tamsulosin (FLOMAX) 0.4 MG CAPS capsule  Take 1 capsule (0.4 mg total) by mouth daily after breakfast. 03/20/19   Loletha Grayer, MD  vitamin C (VITAMIN C) 500 MG tablet Take 1 tablet (500 mg total) by mouth 2 (two) times daily. 07/09/19   [name], MD  XARELTO 20 MG TABS tablet TAKE 1 TABLET BY MOUTH EVERY DAY WITH LUNCH Patient taking differently: Take 20 mg by mouth daily with lunch.  05/17/19   Minna Merritts, MD    Allergies    Iodine, Shrimp [shellfish allergy], and Tetracycline  Review of Systems   Review of Systems  Constitutional: Negative for chills and fever.  Respiratory: Positive for shortness of breath. Negative for cough.   Cardiovascular: Positive for chest pain. Negative for palpitations and leg swelling.  Gastrointestinal: Negative for abdominal pain, diarrhea, nausea and vomiting.  All other systems reviewed and are negative.   Physical Exam Updated Vital Signs BP 94/68   Pulse 95   Temp (!) 97.4 F (36.3 C)   Resp 16   SpO2 99%   Physical Exam Vitals and nursing note reviewed.  Constitutional:      Appearance: He is not ill-appearing.  HENT:     Head: Normocephalic and atraumatic.  Eyes:     Conjunctiva/sclera: Conjunctivae normal.  Cardiovascular:     Rate and Rhythm: Normal rate and regular rhythm.     Pulses:          Radial pulses are 2+ on the right side and 2+ on the left side.       Dorsalis pedis pulses are 2+ on the right side and 2+ on the left side.      Heart sounds: Normal heart sounds.  Pulmonary:     Effort: Pulmonary effort is normal.     Breath sounds: Normal breath sounds. No decreased breath sounds, wheezing, rhonchi or rales.  Chest:     Chest wall: Tenderness present.  Abdominal:     Palpations: Abdomen is soft.     Tenderness: There is no abdominal tenderness. There is no guarding or rebound.     Comments: Colostomy bag in place on left  Genitourinary:    Comments: Indwelling foley catheter Musculoskeletal:     Cervical back: Neck supple.  Skin:    General: Skin is warm and dry.  Neurological:     Mental Status: He is alert.     ED Results / Procedures / Treatments   Labs (all labs ordered are listed, but only abnormal results are displayed) Labs Reviewed  BASIC METABOLIC PANEL - Abnormal; Notable for the following components:      Result Value   Potassium 3.1 (*)    Creatinine, Ser 1.68 (*)    Calcium 8.5 (*)    GFR calc non Af Amer 41 (*)    GFR calc Af Amer 48 (*)    All other components within normal limits  CBC - Abnormal; Notable for the following components:   WBC 16.6 (*)    RBC 3.03 (*)    Hemoglobin 8.3 (*)    HCT 26.8 (*)    RDW 18.6 (*)    All other components within normal limits  BRAIN NATRIURETIC PEPTIDE - Abnormal; Notable for the following components:   B Natriuretic Peptide 558.1 (*)    All other components within normal limits  DIGOXIN LEVEL - Abnormal; Notable for the following components:   Digoxin Level 0.4 (*)    All other components within normal limits  HEPATIC FUNCTION PANEL - Abnormal; Notable  for the following components:   Albumin 2.2 (*)    Alkaline Phosphatase 133 (*)    All other components within normal limits  TROPONIN I (HIGH SENSITIVITY) - Abnormal; Notable for the following components:   Troponin I (High Sensitivity) 29 (*)    All other components within normal limits  SARS CORONAVIRUS 2 (TAT 6-24 HRS)  LIPASE, BLOOD  URINALYSIS, ROUTINE W REFLEX MICROSCOPIC   TROPONIN I (HIGH SENSITIVITY)    EKG EKG Interpretation  Date/Time:  Saturday October 26 2019 08:07:25 EDT Ventricular Rate:  88 PR Interval:    QRS Duration: 120 QT Interval:  396 QTC Calculation: 480 R Axis:   -68 Text Interpretation: Sinus rhythm Short PR interval Left anterior fascicular block Probable anteroseptal infarct, old Confirmed by Quintella Reichert [id number]) on 10/26/2019 9:08:35 AM   Radiology DG Chest 2 View  Result Date: 10/26/2019 CLINICAL DATA:  Chest pain EXAM: CHEST - 2 VIEW COMPARISON:  10/01/2019 FINDINGS: There is a dual lead cardiac pacemaker present. There is no focal consolidation. There is no pleural effusion or pneumothorax. The heart and mediastinal contours are unremarkable. There is no acute osseous abnormality. IMPRESSION: No active cardiopulmonary disease. Electronically Signed   By: Kathreen Devoid   On: 10/26/2019 07:07    Procedures Procedures (including critical care time)  Medications Ordered in ED Medications  sodium chloride flush (NS) 0.9 % injection 3 mL (3 mLs Intravenous Given 10/26/19 0721)  sodium chloride 0.9 % bolus 500 mL (0 mLs Intravenous Stopped 10/26/19 0755)  nitroGLYCERIN (NITROSTAT) SL tablet 0.4 mg (0.4 mg Sublingual Given 10/26/19 0727)  fentaNYL (SUBLIMAZE) injection 25 mcg (25 mcg Intravenous Given 10/26/19 0809)  alum & mag hydroxide-simeth (MAALOX/MYLANTA) 200-200-20 MG/5ML suspension 30 mL (30 mLs Oral Given 10/26/19 0810)    And  lidocaine (XYLOCAINE) 2 % viscous mouth solution 15 mL (15 mLs Oral Given 10/26/19 0810)  potassium chloride 10 mEq in 100 mL IVPB (0 mEq Intravenous Stopped 10/26/19 0940)    ED Course  I have reviewed the triage vital signs and the nursing notes.  Pertinent labs & imaging results that were available during my care of the patient were reviewed by me and considered in my medical decision making (see chart for details).  Clinical Course as of Oct 26 1002  Sat Oct 26, 2019  0822 Baseline   Creatinine(!): 1.68 [MV]  0822 Will replete   Potassium(!): 3.1 [MV]  0823 Troponin I (High Sensitivity)(!): 29 [MV]  [phone] Discussed case with [facility] Dr. Lorin Mercy who agrees to accept patient for admission; will consult cards    [MV]  1002 Dr. Harl Bowie with cardiology to come evaluate patient   [MV]    Clinical Course User Index [MV] Eustaquio Maize, PA-C   MDM Rules/Calculators/A&P                      69 year old male from cardiac rehab [facility] today with sudden onset left-sided chest pain radiating to left upper extremity and upper back around 4 AM.  History of multiple MIs with stenting states that this feels similar.  Treated initially with 2 nitro and 325 mg aspirin however became hypotensive.  Arrival to the ED patient's blood pressure 94/68.  He is afebrile, nontachycardic and nontachypneic.  Does appear to be uncomfortable due to his chest pain.  100 cc fluid bolus initially ordered prior to being seen.  He does have history of CHF with EF of 20 to 25%.  Fluids blood pressure is slightly  improved and another nitroglycerin was given however blood pressure again dropped.  Due to this nitroglycerin discontinued.  He was given fentanyl for pain and cardiac lab work initiated.  EKG does appear unchanged from previous.  Has equal pulses good perfusion, doubt dissection at this time.  He is anticoagulated with history of A. fib and has not missed any doses.  Continues to be nontachycardic.  Doubt PE.   X-ray clear. CBC with leukocytosis 16,000 however at time of discharge 3 days ago it was elevated at 13.4. again pt afebrile and I doubt infectious etiology.   WBC  Date Value Ref Range Status  10/26/2019 16.6 (H) 4.0 - 10.5 K/uL Final  10/23/2019 13.4 (H) 4.0 - 10.5 K/uL Final  10/22/2019 14.0 (H) 4.0 - 10.5 K/uL Final  10/21/2019 13.2 (H) 4.0 - 10.5 K/uL Final   BMP with potassium 3.1; will replete. Creatinine appears stable compared to baseline at 1.68.  Troponin 29; will repeat.   BNP 558 however appears improved from previous. No vascular congestion appreciated on xray Digoxin level 0.4  On reeval after 3 total NTG, 325 mg ASA, GI cocktail, and 25 mg Fentanyl pt still continues to have active chest pain. Repeat EKG continued to remain unchanged from previous. Given continued chest pain pt will need to be admitted - due to his other medical conditions attending physician Dr. Ralene Bathe has recommended medical admission. Will consult cards if necessary. Covid test initially ordered prior to seeing pt was positive on 2/22.   Admitted to medicine.  Cardiology to follow.  This note was prepared using Dragon voice recognition software and may include unintentional dictation errors due to the inherent limitations of voice recognition software.   Final Clinical Impression(s) / ED Diagnoses Final diagnoses:  Nonspecific chest pain  Hypokalemia    Rx / DC Orders ED Discharge Orders    None       Eustaquio Maize, PA-C 10/26/19 1004    Quintella Reichert, MD 10/26/19 1659

## 2019-10-26 NOTE — ED Notes (Signed)
Report called to [facility]

## 2019-10-26 NOTE — Progress Notes (Signed)
Chart reviewed at the request of Pamala Hurry, RN (2W) upon patient's admission to the  unit.  Patient to remain on contact precautions based on previous chart information.

## 2019-10-26 NOTE — H&P (Signed)
History and Physical    Colton Lamb BTD:[id number] DOB: May 04, 1951 DOA: 10/26/2019  PCP: [name], MD Consultants:  Palliative care; Rockey Situ - cardiology Patient coming from: [facility]; NOK: Colton Lamb, [id number]  Chief Complaint:  Chest pain  HPI: Colton Lamb is a 69 y.o. male with medical history significant of DM; OSA; afib; obesity (BMI 34); HTN; HLD; Crohn's disease s/p colosctomy; CAD; chronic systolic CHF; [id number] infection on 09/23/19; and atrial flutter s/p cardioversion and on Xarelto presenting with chest pain.  He was previously admitted from home on 3/12 and discharged to [facility] on 3/24.  He has had at least 2 recent infections for Pseudomonas bacteremia due to chronic osteomyelitis with subsequent AKI, afib with RVR, and severe thrombocytopenia/anemia.  He was recommended to be on Cipro through 11/04/19.  He reports that he was lying in bed awake at about 4-5AM when he noticed sharp left-sided CP.  It was nonexertional and waxed and waned.  He did not notice anything that made it better or worse.  However, given his recent medical problems he decided he needed to be seen in the ER.  He reports that his defibrillator did not fire.    ED Course:   Recent d/c from increased colostomy output with AKI.  Now with left-sided CP, radiating down arm and into upper back.  Has h/o CAD with stents.  EKG unchanged, no STEMI.  HS troponin 29, awaiting repeat.  BP dropped to 94/68 with NTG with EMS and again in the ER.  Still having pain despite fentanyl and GI cocktail.  Review of Systems: As per HPI; otherwise review of systems reviewed and negative.    Past Medical History:  Diagnosis Date  . Atrial flutter ([place])    a. s/p Cardioversion 11/22/13, on amiodarone and Xarelto.  . Chronic osteomyelitis ([place]) 06/30/2019   s/p colostomy  . Chronic systolic heart failure ([place])    a. 10/2013 EF 20-25%, grade III DD, RV mildly dilated and sys fx mild/mod  reduced;  b. 01/2014 Echo: EF 30-35%, gr3 DD, mod dil LA. c. 07/2019 EF 20-25%  . Coronary artery disease    a. s/p MI 2007/2015;  b. s/p prior PCI to the LAD/LCX/PDA/PL;  c. 2008: s/p Cypher DES to the OM.  Marland Kitchen Crohn's ileocolitis ([place])   . GERD (gastroesophageal reflux disease)   . Hx of adenomatous colonic polyps 11/2003  . Hyperlipidemia   . Hypertension   . Ischemic cardiomyopathy    s. 01/2014 s/p MDT [id number] [name] XT DR single lead AICD.  Marland Kitchen Obesity   . Paroxysmal atrial fibrillation (HCC)    a. CHA2DS2VASc = (CHF, HTN, agex1, DM)  . Sleep apnea   . Syncope    a.  11/2013 in setting of volume depletion and bradycardia due to dig toxicity   . Type II diabetes mellitus ([place])     Past Surgical History:  Procedure Laterality Date  . ATRIAL FLUTTER ABLATION N/A 04/16/2014   Procedure: ATRIAL FLUTTER ABLATION;  Surgeon: Evans Lance, MD;  Location: [facility] CATH LAB;  Service: Cardiovascular;  Laterality: N/A;  . CARDIAC CATHETERIZATION  10/2013  . CARDIAC DEFIBRILLATOR PLACEMENT  04/16/2014   Medtronic Evira device  . CARDIAC ELECTROPHYSIOLOGY STUDY AND ABLATION  04/16/2014   atrial flutter ablation  . CARDIOVERSION N/A 03/05/2014   Procedure: CARDIOVERSION;  Surgeon: Jolaine Artist, MD;  Location: [place];  Service: Cardiovascular;  Laterality: N/A;  . CATARACT EXTRACTION W/PHACO Right 01/04/2017   Procedure:  CATARACT EXTRACTION PHACO AND INTRAOCULAR LENS PLACEMENT ([place])  Right Diabetic Complicated;  Surgeon: Leandrew Koyanagi, MD;  Location: [place];  Service: Ophthalmology;  Laterality: Right;  Diabetic  . CATARACT EXTRACTION W/PHACO Left 02/08/2017   Procedure: CATARACT EXTRACTION PHACO AND INTRAOCULAR LENS PLACEMENT ([place]) left diabetic;  Surgeon: Leandrew Koyanagi, MD;  Location: [place];  Service: Ophthalmology;  Laterality: Left;  Diabetic - oral meds sleep apnea  . CORONARY ANGIOPLASTY WITH STENT PLACEMENT  2007; 2008 X 2   "1+1 ~ 1"  . FOOT SURGERY  Left    bone spur  . HYDROCELE EXCISION Bilateral   . 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 IMPLANTABLE CARDIOVERTER DEFIBRILLATOR IMPLANT N/A 04/16/2014   Procedure: IMPLANTABLE CARDIOVERTER DEFIBRILLATOR IMPLANT;  Surgeon: Evans Lance, MD;  Location: [facility] CATH LAB;  Service: Cardiovascular;  Laterality: N/A;  . LEFT HEART CATHETERIZATION WITH CORONARY ANGIOGRAM N/A 11/22/2013   Procedure: LEFT HEART CATHETERIZATION WITH CORONARY ANGIOGRAM;  Surgeon: Sinclair Grooms, MD;  Location: [facility] CATH LAB;  Service: Cardiovascular;  Laterality: N/A;  . TRANSVERSE LOOP COLOSTOMY N/A 07/03/2019   Procedure: TRANSVERSE LOOP COLOSTOMY;  Surgeon: Ronny Bacon, MD;  Location: [facility] ORS;  Service: General;  Laterality: N/A;    Social History   Socioeconomic History  . Marital status: Widowed    Spouse name: Not on file  . Number of children: 1  . Years of education: Not on file  . Highest education level: Not on file  Occupational History  . Occupation: retired    Fish farm manager: RETIRED  Tobacco Use  . Smoking status: Never Smoker  . Smokeless tobacco: Never Used  Substance and Sexual Activity  . Alcohol use: No  . Drug use: No  . Sexual activity: Never  Other Topics Concern  . Not on file  Social History Narrative  . Not on file   Social Determinants of Health   Financial Resource Strain: Low Risk   . Difficulty of Paying Living Expenses: Not hard at all  Food Insecurity: No Food Insecurity  . Worried About Charity fundraiser in the Last Year: Never true  . Ran Out of Food in the Last Year: Never true  Transportation Needs: No Transportation Needs  . Lack of Transportation (Medical): No  . Lack of Transportation (Non-Medical): No  Physical Activity: Inactive  . Days of Exercise per Week: 0 days  . Minutes of Exercise per Session: 0 min  Stress:   . Feeling of Stress :   Social Connections: Moderately Isolated  . Frequency of Communication with  Friends and Family: Never  . Frequency of Social Gatherings with Friends and Family: Never  . Attends Religious Services: Never  . Active Member of Clubs or Organizations: Yes  . Attends Archivist Meetings: 1 to 4 times per year  . Marital Status: Never married  Intimate Partner Violence: Unknown  . Fear of Current or Ex-Partner: No  . Emotionally Abused: Not on file  . Physically Abused: No  . Sexually Abused: No    Allergies  Allergen Reactions  . Iodine Other (See Comments)    Shortness of breath, swelling and hives  . Shrimp [Shellfish Allergy] Other (See Comments)    SWELLING    HIVES    SHORTNESS OF BREATH  . Tetracycline Rash    Family History  Problem Relation Age of Onset  . Breast cancer Mother   . Heart disease Father   . Heart attack Father   .  Colon cancer Neg Hx     Prior to Admission medications   Medication Sig Start Date End Date Taking? Authorizing Provider  acetaminophen (TYLENOL) 325 MG tablet Take 1-2 tablets (325-650 mg total) by mouth every 4 (four) hours as needed for mild pain. 04/17/14   [name], NP  Albuterol Sulfate 108 (90 Base) MCG/ACT AEPB Inhale 1 puff into the lungs every 6 (six) hours as needed (shortness of breath).     [provider]  atorvastatin (LIPITOR) 80 MG tablet TAKE 1 TABLET BY MOUTH EVERY DAY Patient taking differently: Take 80 mg by mouth daily.  03/08/19   Minna Merritts, MD  balsalazide (COLAZAL) 750 MG capsule TAKE 1 CAPSULE (750 MG TOTAL) BY MOUTH 3 (THREE) TIMES DAILY. Patient taking differently: Take 750 mg by mouth 3 (three) times daily.  11/28/18   Ladene Artist, MD  ciprofloxacin (CIPRO) 500 MG tablet Take 1 tablet (500 mg total) by mouth 2 (two) times daily for 12 days. 10/23/19 11/04/19  Wyvonnia Dusky, MD  collagenase (SANTYL) ointment Apply 1 application topically daily. (apply to sacrum)    [provider]  digoxin 62.5 MCG TABS Take 0.0625 mg by mouth daily. 10/24/19 11/23/19   Wyvonnia Dusky, MD  ferrous gluconate (FERGON) 324 MG tablet Take 1 tablet (324 mg total) by mouth daily with breakfast. 10/24/19 11/23/19  Wyvonnia Dusky, MD  finasteride (PROSCAR) 5 MG tablet Take 1 tablet (5 mg total) by mouth daily. 03/20/19   Loletha Grayer, MD  insulin aspart (NOVOLOG) 100 UNIT/ML injection Inject 5 Units into the skin 3 (three) times daily with meals. Patient taking differently: Inject 5 Units into the skin 3 (three) times daily before meals.  03/19/19   Loletha Grayer, MD  ipratropium-albuterol (DUONEB) 0.5-2.5 (3) MG/3ML SOLN Take 3 mLs by nebulization every 4 (four) hours as needed (shortness of breath or wheezing).    [provider]  lactobacillus (FLORANEX/LACTINEX) PACK Take 1 packet (1 g total) by mouth 3 (three) times daily with meals. 03/19/19   Loletha Grayer, MD  midodrine (PROAMATINE) 10 MG tablet Take 1 tablet (10 mg total) by mouth 3 (three) times daily. 04/26/19   Max Sane, MD  Multiple Vitamins-Minerals (DECUBI-VITE PO) Take 1 capsule by mouth daily.     [provider]  Nutritional Supplements (FEEDING SUPPLEMENT, NEPRO CARB STEADY,) LIQD Take 237 mLs by mouth 2 (two) times daily between meals. 03/19/19   Loletha Grayer, MD  omeprazole (PRILOSEC) 20 MG capsule Take 20 mg by mouth daily.     [provider]  ondansetron (ZOFRAN) 4 MG tablet Take 4 mg by mouth every 8 (eight) hours as needed for nausea or vomiting.    [provider]  tamsulosin (FLOMAX) 0.4 MG CAPS capsule Take 1 capsule (0.4 mg total) by mouth daily after breakfast. 03/20/19   Loletha Grayer, MD  vitamin C (VITAMIN C) 500 MG tablet Take 1 tablet (500 mg total) by mouth 2 (two) times daily. 07/09/19   [name], MD  XARELTO 20 MG TABS tablet TAKE 1 TABLET BY MOUTH EVERY DAY WITH LUNCH Patient taking differently: Take 20 mg by mouth daily with lunch.  05/17/19   Minna Merritts, MD    Physical Exam: Vitals:   10/26/19 1300 10/26/19 1330  10/26/19 1400 10/26/19 1445  BP: 101/90 113/74 106/75 111/74  Pulse: 96 92 98 96  Resp: (!) 23 20 (!) 21 16  Temp:      SpO2: 100%  100% 100% 100%     . General:  Appears calm and comfortable and is NAD . Eyes:  PERRL, EOMI, normal lids, iris . ENT:  grossly normal hearing, lips & tongue, mmm; poor dentition . Neck:  no LAD, masses or thyromegaly . Cardiovascular:  RRR, no m/r/g. 1-2+ LE edema. AICD in left upper chest with pain medial to the device . Respiratory:   CTA bilaterally with no wheezes/rales/rhonchi.  Normal respiratory effort. . Abdomen:  soft, NT, ND, NABS . Back:   normal alignment, no CVAT . Skin:  no rash or induration seen on limited exam . Musculoskeletal:  grossly normal tone BUE/BLE, good ROM, no bony abnormality . Psychiatric:  grossly normal mood and affect, speech fluent and appropriate, AOx3 . Neurologic:  CN 2-12 grossly intact, moves all extremities in coordinated fashion    Radiological Exams on Admission: DG Chest 2 View  Result Date: 10/26/2019 CLINICAL DATA:  Chest pain EXAM: CHEST - 2 VIEW COMPARISON:  10/01/2019 FINDINGS: There is a dual lead cardiac pacemaker present. There is no focal consolidation. There is no pleural effusion or pneumothorax. The heart and mediastinal contours are unremarkable. There is no acute osseous abnormality. IMPRESSION: No active cardiopulmonary disease. Electronically Signed   By: Kathreen Devoid   On: 10/26/2019 07:07    EKG: Independently reviewed.   2841 - Sinus tachcyardia with rate 100; prolonged QTc 584; LAFB; nonspecific ST changes with no evidence of acute ischemia 0807 - NSR with rate 88; QTc has normalized (480); LAFB; nonspecific ST changes with no evidence of acute ischemia   Labs on Admission: I have personally reviewed the available labs and imaging studies at the time of the admission.  Pertinent labs:   K+ 3.1 BUN 19/Creatinine 1.68/GFR 41 - stable BNP 558.1 HS troponin 29, 26 WBC 16.6 Hgb 8.3;  stable Dig 0.4 COVID negative (positive on 2/21) Lipids: 105/25/57/113   Assessment/Plan Principal Problem:   Chest pain Active Problems:   Hyperlipidemia   Atrial fibrillation ([place])   CROHN'S DISEASE-LARGE & SMALL INTESTINE   Diabetes mellitus type 2, uncontrolled, with complications (HCC)   Chronic systolic CHF (congestive heart failure) (HCC)   Decubitus ulcer of sacral region, unstageable ([place])   Bacteremia due to Pseudomonas   COVID-19 virus infection   Chest pain -Patient with left-sided CP that came on acutely at rest last night and has been waxing and waning in intensity since -CXR unremarkable.   -Initial cardiac HS troponin mildly elevated with negative Delta  -EKG not indicative of acute ischemia.   -HEART pathway score is 6, indicating that the patient has an elevated risk score and requires further evaluation. -Will plan to place in observation status on telemetry to rule out ACS by overnight observation.  -Repeat EKG in AM -Cardiology consultation  HTN -No longer on BP medications and actually takes Midodrine now to maintain his BP  HLD -Continue Lipitor -Lipids show good control at this time  DM -Last A1c was 5.1, indicating good control -Continue Lantus -Will cover with moderate-scale SSI for now  Afib -Rate control with Dig due to hypotension issues -Continue Zarelto  Crohn's -s/p colostomy -High output has resolved -Continue Colazol -Continue Florastor  Sacral pressure ulcer -Continue Santyl -He reports that this is improving with wound care at his facility  Recent COVID-19 infection -This patient had and was treated for COVID-19 infection in February 2021 -Even the most severe infections are assumed to by no longer infectious after 21 days. -The test can remain  positive for up to 90 days after infection and so repeat testing is not recommended for these patients. -He does not require any special COVID precautions at this time, including  airborne/contact isolation.  Recent Pseudomonas bacteremia -Associated with sacral pressure ulcer -Continue Cipro through 4/5  Chronic combined CHF -2/27 echo with EF 25-30%, appears to be stable at this time -He appears euvolemic currently   DVT prophylaxis: Xarelto  Code Status:  Full - confirmed with patient Family Communication: None present Disposition Plan:  Vista Mink is anticipated to d/c back to [facility] for rehab once his cardiology issues have been resolved. Consults called: Cardiology  Admission status: It is my clinical opinion that referral for OBSERVATION is reasonable and necessary in this patient based on the above information provided. The aforementioned taken together are felt to place the patient at high risk for further clinical deterioration. However it is anticipated that the patient may be medically stable for discharge from the hospital within 24 to 48 hours.      Karmen Bongo MD [facility]   How to contact the [facility] Attending or Consulting provider [place] or covering provider during after hours [place], for this patient?  1. Check the care team in [facility] and look for a) attending/consulting TR[facility] provider listed and b) the [facility] team listed 2. Log into [email or web address] and use [place]'s universal password to access. If you do not have the password, please contact the hospital operator. 3. Locate the [facility] provider you are looking for under [facility] and page to a number that you can be directly reached. 4. If you still have difficulty reaching the provider, please page the [facility] (Director on Call) for the Hospitalists listed on amion for assistance.   10/26/2019, 3:11 PM

## 2019-10-26 NOTE — ED Notes (Signed)
Pt eating lunch

## 2019-10-26 NOTE — ED Notes (Signed)
Delay in initial troponin draw, next trop due at 915

## 2019-10-26 NOTE — ED Notes (Signed)
Patient taken to xray.

## 2019-10-26 NOTE — ED Notes (Signed)
medtronic ICD interrogated

## 2019-10-26 NOTE — Consult Note (Addendum)
Cardiology Consultation:   Patient ID: Colton Lamb; [id number]; July 30, 1951   Admit date: 10/26/2019 Date of Consult: 10/26/2019  Primary Care Provider: [name], MD Primary Cardiologist: Ida Rogue, MD Primary Electrophysiologist:  None  Patient Profile:   Colton Lamb is a 69 y.o. male with a PMH of CAD s/p PCI to LAD, RCA, and LCx with subsequent occlusion of LAD stent medically managed on LHC in 2015, chronic combined CHF s/p MDT ICD, atrial fib/flutter s/p DCCV on xarelto, HTN, HLD, DM type 2, OSA, crohns disease s/p diverting colostomy, COVID 19 09/2019, CKD stage 3, and more recently pseudomonas bacteremia 2/2 sacral decubitus ulcer resulting in chronic osteomyelitis,  who is being seen today for the evaluation of chest pain at the request of Dr. Lorin Mercy.   History of Present Illness:   Colton Lamb was in his usual state of health until 4am this morning when he noticed left sided, stabbing, chest pain shortly after waking. Pain radiated to his left upper back and arm. He was given 360m ASA and 2 SL nitro by EMS with some improvement in pain but not complete resolution. He subsequently received a GI cocktail and fentanyl in the ED with no change in symptoms, now 5/10 (10/10 at onset). He denies associated SOB, diaphoresis, N/V, dizziness, lightheadedness, syncope, palpitations, or ICD shock. On further questioning, he reports that, with the assistance of an aid, he was getting back into bed yesterday and slipped, causing him to fall back in the bed abruptly, resulting in a small skin tear to his left elbow. He did not appreciate any chest pain at that time. He does have tenderness to touch of his left chest wall and some worsening of pain with movement of the left arm and when rolling over for pulmonary exam.   He was [facility] by cardiology [facility]. Cardiology followed for atrial fibrillation with RVR. He was  discharged home on digoxin for rate control as hypotension requiring midodrine limited addition of BBlocker. His hospital course was complicated by severe thrombocytopenia (felt to be 2/2 antibiotics) and severe anemia requiring transfusions with temporary interruption of his xarelto, ultimately restarted due to high stroke risk. Last LHC was in 2015 which showed occluded LAD at site of prior stent which was medically managed, with patent LCx with moderate obstruction in the dRCA. His last echo in 09/2019   ED course: intermittently hypotensive (low 79/54), otherwise VSS. Labs notable for K 3.1, Cr 1.68, Albumin 2.2, WBC 16.6, Hgb 8.3, PLT 378, Trop 29>26, BNP 558. CXR without acute findings. EKG with sinus tachycardia, rate 100bpm, 1st degree AV block, LAFB, non-specific ST-T wave abnormalities, no STE/D, QTc 584; improvement in QTc to 480 and no dynamic changes on repeat EKG. Patient was given a GI cocktail and fentanyl in the ED with no improvement in symptoms. Patient was admitted to medicine. Cardiology asked to evaluate for chest pain.   Past Medical History:  Diagnosis Date  . Atrial flutter ([place]    a. s/p Cardioversion 11/22/13, on amiodarone and Xarelto.  . Chronic osteomyelitis ([place] 06/30/2019   s/p colostomy  . Chronic systolic heart failure ([place]    a. 10/2013 EF 20-25%, grade III DD, RV mildly dilated and sys fx mild/mod reduced;  b. 01/2014 Echo: EF 30-35%, gr3 DD, mod dil LA. c. 07/2019 EF 20-25%  . Coronary artery disease    a. s/p MI 2007/2015;  b. s/p prior PCI to the LAD/LCX/PDA/PL;  c. 2008: s/p Cypher DES to the OM.  Colton Lamb Crohn's ileocolitis ([place])   . GERD (gastroesophageal reflux disease)   . Hx of adenomatous colonic polyps 11/2003  . Hyperlipidemia   . Hypertension   . Ischemic cardiomyopathy    s. 01/2014 s/p MDT [id number] [name] XT DR single lead AICD.  Colton Lamb Obesity   . Paroxysmal atrial fibrillation (HCC)    a. CHA2DS2VASc = (CHF, HTN, agex1, DM)  . Sleep apnea   . Syncope     a.  11/2013 in setting of volume depletion and bradycardia due to dig toxicity   . Type II diabetes mellitus ([place])     Past Surgical History:  Procedure Laterality Date  . ATRIAL FLUTTER ABLATION N/A 04/16/2014   Procedure: ATRIAL FLUTTER ABLATION;  Surgeon: Evans Lance, MD;  Location: [facility] CATH LAB;  Service: Cardiovascular;  Laterality: N/A;  . CARDIAC CATHETERIZATION  10/2013  . CARDIAC DEFIBRILLATOR PLACEMENT  04/16/2014   Medtronic Evira device  . CARDIAC ELECTROPHYSIOLOGY STUDY AND ABLATION  04/16/2014   atrial flutter ablation  . CARDIOVERSION N/A 03/05/2014   Procedure: CARDIOVERSION;  Surgeon: Jolaine Artist, MD;  [facility] ENDOSCOPY;  Service: Cardiovascular;  Laterality: N/A;  . CATARACT EXTRACTION W/PHACO Right 01/04/2017   Procedure: CATARACT EXTRACTION PHACO AND INTRAOCULAR LENS PLACEMENT ([place])  Right Diabetic Complicated;  Surgeon: Leandrew Koyanagi, MD;  Location: [name];  Service: Ophthalmology;  Laterality: Right;  Diabetic  . CATARACT EXTRACTION W/PHACO Left 02/08/2017   Procedure: CATARACT EXTRACTION PHACO AND INTRAOCULAR LENS PLACEMENT ([place]) left diabetic;  Surgeon: Leandrew Koyanagi, MD;  Location: [place];  Service: Ophthalmology;  Laterality: Left;  Diabetic - oral meds sleep apnea  . CORONARY ANGIOPLASTY WITH STENT PLACEMENT  2007; 2008 X 2   "1+1 ~ 1"  . FOOT SURGERY Left    bone spur  . HYDROCELE EXCISION Bilateral   . 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 IMPLANTABLE CARDIOVERTER DEFIBRILLATOR IMPLANT N/A 04/16/2014   Procedure: IMPLANTABLE CARDIOVERTER DEFIBRILLATOR IMPLANT;  Surgeon: Evans Lance, MD;  Location: [facility] CATH LAB;  Service: Cardiovascular;  Laterality: N/A;  . LEFT HEART CATHETERIZATION WITH CORONARY ANGIOGRAM N/A 11/22/2013   Procedure: LEFT HEART CATHETERIZATION WITH CORONARY ANGIOGRAM;  Surgeon: Sinclair Grooms, MD;  Location: [facility] CATH LAB;  Service: Cardiovascular;  Laterality:  N/A;  . TRANSVERSE LOOP COLOSTOMY N/A 07/03/2019   Procedure: TRANSVERSE LOOP COLOSTOMY;  Surgeon: Ronny Bacon, MD;  Location: [facility] ORS;  Service: General;  Laterality: N/A;     Home Medications:  Prior to Admission medications   Medication Sig Start Date End Date Taking? Authorizing Provider  acetaminophen (TYLENOL) 325 MG tablet Take 1-2 tablets (325-650 mg total) by mouth every 4 (four) hours as needed for mild pain. 04/17/14  Yes [name], NP  Albuterol Sulfate 108 (90 Base) MCG/ACT AEPB Inhale 1 Lamb into the lungs every 6 (six) hours as needed (shortness of breath).    Yes [provider]  atorvastatin (LIPITOR) 80 MG tablet TAKE 1 TABLET BY MOUTH EVERY DAY Patient taking differently: Take 80 mg by mouth daily.  03/08/19  Yes Gollan, Kathlene November, MD  balsalazide (COLAZAL) 750 MG capsule TAKE 1 CAPSULE (750 MG TOTAL) BY MOUTH 3 (THREE) TIMES DAILY. Patient taking differently: Take 750 mg by mouth 3 (three) times daily.  11/28/18  Yes Ladene Artist, MD  ciprofloxacin (CIPRO) 500 MG tablet Take 1 tablet (500 mg total) by mouth 2 (two) times daily  for 12 days. 10/23/19 11/04/19 Yes Wyvonnia Dusky, MD  digoxin (LANOXIN) 0.125 MG tablet Take 0.0625 mg by mouth daily. 10/25/19  Yes [provider]  ferrous gluconate (FERGON) 324 MG tablet Take 1 tablet (324 mg total) by mouth daily with breakfast. 10/24/19 11/23/19 Yes Wyvonnia Dusky, MD  finasteride (PROSCAR) 5 MG tablet Take 1 tablet (5 mg total) by mouth daily. 03/20/19  Yes Wieting, Richard, MD  insulin glargine (LANTUS) 100 UNIT/ML injection Inject 5 Units into the skin daily.   Yes [provider]  ipratropium-albuterol (DUONEB) 0.5-2.5 (3) MG/3ML SOLN Take 3 mLs by nebulization every 4 (four) hours as needed (shortness of breath or wheezing).   Yes [provider]  midodrine (PROAMATINE) 10 MG tablet Take 1 tablet (10 mg total) by mouth 3 (three) times daily. 04/26/19  Yes Max Sane, MD    Multiple Vitamins-Minerals (DECUBI-VITE PO) Take 1 capsule by mouth daily.    Yes [provider]  omeprazole (PRILOSEC) 20 MG capsule Take 20 mg by mouth daily.    Yes [provider]  ondansetron (ZOFRAN) 4 MG tablet Take 4 mg by mouth every 8 (eight) hours as needed for nausea or vomiting.   Yes [provider]  saccharomyces boulardii (FLORASTOR) 250 MG capsule Take 250 mg by mouth 3 (three) times daily.   Yes [provider]  tamsulosin (FLOMAX) 0.4 MG CAPS capsule Take 1 capsule (0.4 mg total) by mouth daily after breakfast. 03/20/19  Yes Wieting, Richard, MD  vitamin C (VITAMIN C) 500 MG tablet Take 1 tablet (500 mg total) by mouth 2 (two) times daily. Patient taking differently: Take 1,000 mg by mouth 2 (two) times daily.  07/09/19  Yes [name], MD  XARELTO 20 MG TABS tablet TAKE 1 TABLET BY MOUTH EVERY DAY WITH LUNCH Patient taking differently: Take 20 mg by mouth daily with lunch.  05/17/19  Yes Minna Merritts, MD  collagenase (SANTYL) ointment Apply 1 application topically daily. (apply to sacrum)    [provider]  insulin aspart (NOVOLOG) 100 UNIT/ML injection Inject 5 Units into the skin 3 (three) times daily with meals. Patient taking differently: Inject 0-12 Units into the skin 2 (two) times daily. Sliding Scale:  70-200= 0 units, 201-250= 2 units, 251-300= 4 units, 301-350= 6 units, 351-400=8 units, 401-450= 10 units, 451-500= 12 units. 03/19/19   Loletha Grayer, MD  lactobacillus (FLORANEX/LACTINEX) PACK Take 1 packet (1 g total) by mouth 3 (three) times daily with meals. Patient not taking: Reported on 10/26/2019 03/19/19   Loletha Grayer, MD  Nutritional Supplements (FEEDING SUPPLEMENT, NEPRO CARB STEADY,) LIQD Take 237 mLs by mouth 2 (two) times daily between meals. Patient not taking: Reported on 10/26/2019 03/19/19   Loletha Grayer, MD    Inpatient Medications: Scheduled Meds: . ascorbic acid  500 mg Oral BID  .  atorvastatin  80 mg Oral Daily  . balsalazide  750 mg Oral TID  . ciprofloxacin  500 mg Oral BID  . digoxin  0.0625 mg Oral Daily  . docusate sodium  100 mg Oral BID  . feeding supplement (NEPRO CARB STEADY)  237 mL Oral BID BM  . [START ON 10/27/2019] ferrous gluconate  324 mg Oral Q breakfast  . finasteride  5 mg Oral Daily  . insulin aspart  0-9 Units Subcutaneous TID WC  . lactobacillus  1 g Oral TID WC  . midodrine  10 mg Oral TID  . multivitamin  1 tablet Oral Daily  .  pantoprazole  40 mg Oral Daily  . rivaroxaban  20 mg Oral Q lunch  . tamsulosin  0.4 mg Oral QPC breakfast   Continuous Infusions:  PRN Meds: acetaminophen **OR** acetaminophen, albuterol, bisacodyl, hydrALAZINE, HYDROcodone-acetaminophen, morphine injection, ondansetron **OR** ondansetron (ZOFRAN) IV, polyethylene glycol, zolpidem  Allergies:    Allergies  Allergen Reactions  . Iodine Other (See Comments)    Shortness of breath, swelling and hives  . Shrimp [Shellfish Allergy] Other (See Comments)    SWELLING    HIVES    SHORTNESS OF BREATH  . Tetracycline Rash    Social History:   Social History   Socioeconomic History  . Marital status: Widowed    Spouse name: Not on file  . Number of children: 1  . Years of education: Not on file  . Highest education level: Not on file  Occupational History  . Occupation: retired    Fish farm manager: RETIRED  Tobacco Use  . Smoking status: Never Smoker  . Smokeless tobacco: Never Used  Substance and Sexual Activity  . Alcohol use: No  . Drug use: No  . Sexual activity: Never  Other Topics Concern  . Not on file  Social History Narrative  . Not on file   Social Determinants of Health   Financial Resource Strain: Low Risk   . Difficulty of Paying Living Expenses: Not hard at all  Food Insecurity: No Food Insecurity  . Worried About Charity fundraiser in the Last Year: Never true  . Ran Out of Food in the Last Year: Never true  Transportation Needs: No  Transportation Needs  . Lack of Transportation (Medical): No  . Lack of Transportation (Non-Medical): No  Physical Activity: Inactive  . Days of Exercise per Week: 0 days  . Minutes of Exercise per Session: 0 min  Stress:   . Feeling of Stress :   Social Connections: Moderately Isolated  . Frequency of Communication with Friends and Family: Never  . Frequency of Social Gatherings with Friends and Family: Never  . Attends Religious Services: Never  . Active Member of Clubs or Organizations: Yes  . Attends Archivist Meetings: 1 to 4 times per year  . Marital Status: Never married  Intimate Partner Violence: Unknown  . Fear of Current or Ex-Partner: No  . Emotionally Abused: Not on file  . Physically Abused: No  . Sexually Abused: No    Family History:    Family History  Problem Relation Age of Onset  . Breast cancer Mother   . Heart disease Father   . Heart attack Father   . Colon cancer Neg Hx      ROS:  Please see the history of present illness.   All other ROS reviewed and negative.     Physical Exam/Data:   Vitals:   10/26/19 1230 10/26/19 1245 10/26/19 1300 10/26/19 1330  BP: 113/72 94/82 101/90 113/74  Pulse: 89 88 96 92  Resp: 13 17 (!) 23 20  Temp:      SpO2: 100% 100% 100% 100%    Intake/Output Summary (Last 24 hours) at 10/26/2019 1356 Last data filed at 10/26/2019 0940 Gross per 24 hour  Intake 800 ml  Output --  Net 800 ml   There were no vitals filed for this visit. There is no height or weight on file to calculate BMI.  General:  Well nourished, well developed, in no acute distress HEENT: sclera anicteric  Neck: no JVD Vascular: No carotid bruits; distal pulses  2+ bilaterally Cardiac:  normal S1, S2; RRR; no murmurs, rubs, or gallops Lungs:  clear to auscultation bilaterally, no wheezing, rhonchi or rales  Abd: NABS, soft, nontender, no hepatomegaly Ext: no edema Musculoskeletal:  No deformities, BUE and BLE strength normal and  equal Skin: warm and dry  Neuro:  CNs 2-12 intact, no focal abnormalities noted Psych:  Normal affect   EKG:  The EKG was personally reviewed and demonstrates:  sinus tachycardia, rate 100bpm, 1st degree AV block, LAFB, non-specific ST-T wave abnormalities, no STE/D, QTc 584 (improved to 480 on repeat EKG)  Telemetry:  Telemetry was personally reviewed and demonstrates:  Sinus rhythm with occasional PVCs  Relevant CV Studies: Echo 09/28/19: 1. Left ventricular ejection fraction, by estimation, is 25 to 30%. The  left ventricle has severely decreased function. The left ventricle  demonstrates regional wall motion abnormalities , anterior , anterospetal  and apical wall hypokinesis.  2. Right ventricular systolic function is normal. The right ventricular  size is normal. There is mildly elevated pulmonary artery systolic  pressure.  3. Left atrial size was mildly dilated.  4. No vegetation noted.   Echo 06/2019: 1. Left ventricular ejection fraction, by visual estimation, is 20 to  25%. The left ventricle has severely decreased function. There is mildly  increased left ventricular hypertrophy.  2. Severely dilated left ventricular internal cavity size.  3. There is global hypokinesis with thinning/akinesis of the anterior and  septal walls.  4. Global right ventricle was not well visualized.The right ventricular  size is not assessed. Right vetricular wall thickness was not assessed.  5. Left atrial size was not assessed.  6. Right atrial size was not assessed.  7. Small pericardial effusion.  8. The pericardial effusion is circumferential.  9. Mild mitral annular calcification.  10. The mitral valve was not assessed. No assessment of mitral valve  regurgitation.  11. The tricuspid valve is not assessed. Tricuspid valve regurgitation was  not assessed.  12. Aortic valve regurgitation was not assessed.  13. The aortic valve was not assessed. Aortic valve regurgitation  was not  assessed.  14. The pulmonic valve was not well visualized. Pulmonic valve  regurgitation was not assessed.  15. Aortic root could not be assessed.  16. The interatrial septum was not assessed.   Cardiac catheterization: 11/22/2013 ANGIOGRAPHIC DATA: The left main coronary artery is widely patent.  The left anterior descending artery is totally occluded proximally within the previously placed stent.  The left circumflex artery is widely patent.  The right coronary artery is right coronary is codominant. The distal RCA leading into the PDA contains segmental 70% stenosis.  LEFT VENTRICULOGRAM: Left ventricular angiogram was done in the 30 RAO projection and revealed a dilated LV and severe dysfunction. We only performed hand injection due to the severely elevated LVEDP. I would estimate that the patient's LV EF would be 20% or less.  IMPRESSIONS: 1. LAD stent occlusion, duration unknown. QS pattern across the precordium on EKG suggested this is not in acute.  2. Moderate distal right coronary obstruction and widely patent circumflex coronary artery  3. Severe left ventricular systolic function, decompensated with severe elevation in LVEDP. I suspect LV systolic dysfunction is mixed in etiology including the results of anterior infarction of unknown timing but presumed recent and/or a component of tachycardia induced LV dysfunction.  4. Atrial flutter of unknown duration  5. Non-ST elevation myocardial infarction, difficult to sort out relative contribution of demand related to tachycardia versus obstructive disease  and timing of LAD occlusion. The patient is totally pain-free at this time. He states the chest pain started last night at midnight. He is not dyspneic and his blood pressure is controlled.  RECOMMENDATION: Aggressive medical therapy of LV systolic dysfunction   Laboratory Data:  Chemistry Recent Labs  Lab 10/22/19 0443 10/23/19 0500 10/26/19 0716  NA 143 139 138  K  3.9 3.5 3.1*  CL 107 104 101  CO2 27 27 24   GLUCOSE 107* 102* 94  BUN 28* 29* 19  CREATININE 1.76* 1.69* 1.68*  CALCIUM 8.2* 8.1* 8.5*  GFRNONAA 39* 41* 41*  GFRAA 45* 47* 48*  ANIONGAP 9 8 13     Recent Labs  Lab 10/26/19 0716  PROT 6.6  ALBUMIN 2.2*  AST 25  ALT 10  ALKPHOS 133*  BILITOT 0.7   Hematology Recent Labs  Lab 10/22/19 0443 10/23/19 0500 10/26/19 0716  WBC 14.0* 13.4* 16.6*  RBC 2.96* 3.03* 3.03*  HGB 8.2* 8.3* 8.3*  HCT 26.0* 26.0* 26.8*  MCV 87.8 85.8 88.4  MCH 27.7 27.4 27.4  MCHC 31.5 31.9 31.0  RDW 19.3* 19.0* 18.6*  PLT 284 284 378   Cardiac EnzymesNo results for input(s): TROPONINI in the last 168 hours. No results for input(s): TROPIPOC in the last 168 hours.  BNP Recent Labs  Lab 10/26/19 0716  BNP 558.1*    DDimer No results for input(s): DDIMER in the last 168 hours.  Radiology/Studies:  DG Chest 2 View  Result Date: 10/26/2019 CLINICAL DATA:  Chest pain EXAM: CHEST - 2 VIEW COMPARISON:  10/01/2019 FINDINGS: There is a dual lead cardiac pacemaker present. There is no focal consolidation. There is no pleural effusion or pneumothorax. The heart and mediastinal contours are unremarkable. There is no acute osseous abnormality. IMPRESSION: No active cardiopulmonary disease. Electronically Signed   By: Kathreen Devoid   On: 10/26/2019 07:07    Assessment and Plan:   1. Atypical chest pain in patient with CAD s/p multiple interventions: Patient presented with constant chest pain that started around 4am this morning. Somewhat improved with asa, SL nitro, GI cocktail, and fentanyl. He has significant TTP of left chest wall after a fall getting into bed yesterday. His EKG is non-ischemic. Trops with low flat trend not c/w ACS. BNP and CXR to not suggest CHF is contributing. Last LHC was in 2015 which showed occluded LAD at site of prior stent which was medically managed, with patent LCx with moderate obstruction in the dRCA. Not on aspirin due to need  for anticoagulation - Suspect MSK etiology of chest pain after fall yesterday.  - Do not recommend further cardiac testing at this time.  - Continue statin  2. Chronic combined CHF: EF 25-30% on echo 09/2019. Hypotension and CKD have limited GDMT. BNP was 558 today, down from 1655 10/11/19. CXR was without overt edema. He appears euvolemic on exam - Continue digoxin - Continue to limit salt intake to <2g per day and monitor daily weights.   3. Paroxysmal atrial fibrillation: maintaining sinus rhythm. Rate control options limited by hypotension/CHF. On digoxin for rate control and xarelto for stoke ppx. Hgb low but stable - Continue digoxin for rate control - Continue xarelto for stroke ppx  4. HLD: LDL 27 10/26/19 - Continue atorvastatin  5. Anemia: Hgb stable at 8.3 this admission.  - Continue management per primary team  6. Chronic osteomyelitis: 2/2 sacral decub with recent admission for pseudomonas bacteremia. WBC 16.6, up from 13.4 10/23/19. Currently on IV  antibiotics (end date 11/04/19) - Continue management per primary team  For questions or updates, please contact [place] Please consult [email or web address] for contact info under Cardiology/STEMI.   Signed, Abigail Butts, PA-C  10/26/2019 1:56 PM [id number]  Patient seen and examined.  Agree with above documentation.  Colton Lamb is a 69 year old male with a history of CAD status post multivessel PCI with known occlusion of LAD stent being managed medically, chronic combined heart failure status post ICD, paroxysmal atrial fibrillation on Xarelto, hypertension, hyperlipidemia, diabetes, OSA, Crohn's disease, recent Covid infection, CKD stage III, recent Pseudomonas bacteremia due to sacral decubitus ulcer/osteomyelitis.  We are consulted by Dr. Inda Merlin for evaluation of chest pain.  Patient reported that he had onset of left-sided, sharp chest pain after waking this morning.  Reports pain has persisted all day.  He does report  that he fell getting into bed yesterday and notes chest wall tenderness and worsening pain with movement.  In the ED, labs notable for troponin 29->26, Cr 1.7, Hgb 8.3.  EKG shows normal sinus rhythm, rate 88, poor R wave progression, Q waves in V1/2.On exam, patient is alert and oriented, regular rate and rhythm, no murmurs, lungs CTAB, no LE edema or JVD.  Left side of chest is tender to palpation.   For his chest pain, given continuous chest pain with negative high-sensitivity troponins, do not suspect ischemic heart disease as the etiology of his symptoms.  Suspect musculoskeletal etiology, likely due to fall he had yesterday.  No further cardiac work-up recommended at this time.    Donato Heinz, MD

## 2019-10-26 NOTE — ED Notes (Signed)
Patient returned from xray.

## 2019-10-27 DIAGNOSIS — Z8616 Personal history of COVID-19: Secondary | ICD-10-CM | POA: Diagnosis not present

## 2019-10-27 DIAGNOSIS — I110 Hypertensive heart disease with heart failure: Secondary | ICD-10-CM | POA: Diagnosis not present

## 2019-10-27 DIAGNOSIS — I5042 Chronic combined systolic (congestive) and diastolic (congestive) heart failure: Secondary | ICD-10-CM | POA: Diagnosis not present

## 2019-10-27 DIAGNOSIS — R079 Chest pain, unspecified: Secondary | ICD-10-CM

## 2019-10-27 DIAGNOSIS — R0789 Other chest pain: Secondary | ICD-10-CM | POA: Diagnosis not present

## 2019-10-27 LAB — BASIC METABOLIC PANEL
Anion gap: 10 (ref 5–15)
BUN: 16 mg/dL (ref 8–23)
CO2: 26 mmol/L (ref 22–32)
Calcium: 8.4 mg/dL — ABNORMAL LOW (ref 8.9–10.3)
Chloride: 102 mmol/L (ref 98–111)
Creatinine, Ser: 1.53 mg/dL — ABNORMAL HIGH (ref 0.61–1.24)
GFR calc Af Amer: 53 mL/min — ABNORMAL LOW (ref >60)
GFR calc non Af Amer: 46 mL/min — ABNORMAL LOW (ref >60)
Glucose, Bld: 113 mg/dL — ABNORMAL HIGH (ref 70–99)
Potassium: 3.9 mmol/L (ref 3.5–5.1)
Sodium: 138 mmol/L (ref 135–145)

## 2019-10-27 LAB — CUP PACEART REMOTE DEVICE CHECK
Battery Remaining Longevity: 37 mo
Battery Voltage: 2.97 V
Brady Statistic AP VP Percent: 0.03 %
Brady Statistic AP VS Percent: 0.05 %
Brady Statistic AS VP Percent: 13.94 %
Brady Statistic AS VS Percent: 85.98 %
Brady Statistic RA Percent Paced: 0.05 %
Brady Statistic RV Percent Paced: 13.3 %
Date Time Interrogation Session: 20210327080326
HighPow Impedance: 62 Ohm
Implantable Lead Implant Date: 20150916
Implantable Lead Implant Date: 20150916
Implantable Lead Location: 753859
Implantable Lead Location: 753860
Implantable Lead Model: 5076
Implantable Lead Model: 6935
Implantable Pulse Generator Implant Date: 20150916
Lead Channel Impedance Value: 266 Ohm
Lead Channel Impedance Value: 323 Ohm
Lead Channel Impedance Value: 342 Ohm
Lead Channel Pacing Threshold Amplitude: 0.875 V
Lead Channel Pacing Threshold Amplitude: 1 V
Lead Channel Pacing Threshold Pulse Width: 0.4 ms
Lead Channel Pacing Threshold Pulse Width: 0.4 ms
Lead Channel Sensing Intrinsic Amplitude: 1.375 mV
Lead Channel Sensing Intrinsic Amplitude: 1.375 mV
Lead Channel Sensing Intrinsic Amplitude: 5.375 mV
Lead Channel Sensing Intrinsic Amplitude: 5.375 mV
Lead Channel Setting Pacing Amplitude: 1.75 V
Lead Channel Setting Pacing Amplitude: 2 V
Lead Channel Setting Pacing Pulse Width: 0.4 ms
Lead Channel Setting Sensing Sensitivity: 0.3 mV

## 2019-10-27 LAB — CBC WITH DIFFERENTIAL/PLATELET
Abs Immature Granulocytes: 0.11 10*3/uL — ABNORMAL HIGH (ref 0.00–0.07)
Basophils Absolute: 0.1 10*3/uL (ref 0.0–0.1)
Basophils Relative: 1 %
Eosinophils Absolute: 0.5 10*3/uL (ref 0.0–0.5)
Eosinophils Relative: 3 %
HCT: 26.1 % — ABNORMAL LOW (ref 39.0–52.0)
Hemoglobin: 8.4 g/dL — ABNORMAL LOW (ref 13.0–17.0)
Immature Granulocytes: 1 %
Lymphocytes Relative: 10 %
Lymphs Abs: 1.5 10*3/uL (ref 0.7–4.0)
MCH: 27.8 pg (ref 26.0–34.0)
MCHC: 32.2 g/dL (ref 30.0–36.0)
MCV: 86.4 fL (ref 80.0–100.0)
Monocytes Absolute: 0.9 10*3/uL (ref 0.1–1.0)
Monocytes Relative: 6 %
Neutro Abs: 12.8 10*3/uL — ABNORMAL HIGH (ref 1.7–7.7)
Neutrophils Relative %: 79 %
Platelets: 376 10*3/uL (ref 150–400)
RBC: 3.02 MIL/uL — ABNORMAL LOW (ref 4.22–5.81)
RDW: 18.4 % — ABNORMAL HIGH (ref 11.5–15.5)
WBC: 15.9 10*3/uL — ABNORMAL HIGH (ref 4.0–10.5)
nRBC: 0 % (ref 0.0–0.2)

## 2019-10-27 LAB — GLUCOSE, CAPILLARY
Glucose-Capillary: 90 mg/dL (ref 70–99)
Glucose-Capillary: 90 mg/dL (ref 70–99)

## 2019-10-27 MED ORDER — HYDROCODONE-ACETAMINOPHEN 5-325 MG PO TABS: 1.0000 | ORAL_TABLET | ORAL | 0 refills | Status: DC | PRN

## 2019-10-27 NOTE — Discharge Summary (Signed)
Physician Discharge Summary  Colton Lamb ZOX:[id number] DOB: May 30, 1951 DOA: 10/26/2019  PCP: [name], MD  Admit date: 10/26/2019 Discharge date: 10/27/2019  Admitted From: SNF Disposition:  SNF  Discharge Condition:Stable CODE STATUS:FULL Diet recommendation: Heart Healthy  Brief/Interim Summary:  HPI: Colton Lamb is a 69 y.o. male with medical history significant of DM; OSA; afib; obesity (BMI 34); HTN; HLD; Crohn's disease s/p colosctomy; CAD; chronic systolic CHF; [id number] infection on 09/23/19; and atrial flutter s/p cardioversion and on Xarelto presenting with chest pain.  He was previously admitted from home on 3/12 and discharged to [facility] on 3/24.  He has had at least 2 recent infections for Pseudomonas bacteremia due to chronic osteomyelitis with subsequent AKI, afib with RVR, and severe thrombocytopenia/anemia.  He was recommended to be on Cipro through 11/04/19.  He reports that he was lying in bed awake at about 4-5AM when he noticed sharp left-sided CP.  It was nonexertional and waxed and waned.  He did not notice anything that made it better or worse.  However, given his recent medical problems he decided he needed to be seen in the ER.  He reports that his defibrillator did not fire.    ED Course:   Recent d/c from increased colostomy output with AKI.  Now with left-sided CP, radiating down arm and into upper back.  Has h/o CAD with stents.  EKG unchanged, no STEMI.  HS troponin 29,   Hospital Course:  His hospital course remained stable.  He was seen by cardiology during this hospitalization.  His chest pain is thought to be from musculoskeletal etiology.  His troponins have been flat.  EKG did not show any ischemic changes.  He is hemodynamically stable for discharge to skilled nursing facility today.  Following problems were addressed during his hospitalization:  Chest pain -Patient with left-sided CP that came on acutely at rest last night and has  been waxing and waning in intensity since -CXR unremarkable.  -Initial cardiac HS troponin mildly elevated with negative Delta -EKG not indicative of acute ischemia.  -Cardiology did not plan for any intervention, chest pain thought to be from musculoskeletal etiology  HTN -No longer on BP medications and actually takes Midodrine now to maintain his BP  HLD -Continue Lipitor -Lipids show good control at this time  DM -Last A1c was 5.1, indicating good control -Continue Lantus  Afib -Rate control with Dig due to hypotension issues -Continue xarelto  Crohn's -s/p colostomy -High output has resolved -Continue Colazol -Continue Florastor  Sacral pressure ulcer -Continue Santyl -He reports that this is improving with wound care at his facility  Recent COVID-19 infection -This patient had and was treated for COVID-19 infection in February 2021  Recent Pseudomonas bacteremia -Associated with sacral pressure ulcer -Continue Ciprofloxacin  through 4/5  Chronic combined CHF -2/27 echo with EF 25-30%, appears to be stable at this time -He appears euvolemic currently   Discharge Diagnoses:  Principal Problem:   Chest pain Active Problems:   Hyperlipidemia   Atrial fibrillation ([place])   CROHN'S DISEASE-LARGE & SMALL INTESTINE   Diabetes mellitus type 2, uncontrolled, with complications (HCC)   Chronic systolic CHF (congestive heart failure) (HCC)   Decubitus ulcer of sacral region, unstageable ([place])   Bacteremia due to Pseudomonas   COVID-19 virus infection    Discharge Instructions  Discharge Instructions    Diet - low sodium heart healthy   Complete by: As directed    Discharge instructions   Complete by: As  directed    1)Continue your medications. 2)Do a CBC and BMP tests in a week.   Increase activity slowly   Complete by: As directed      Allergies as of 10/27/2019      Reactions   Iodine Other (See Comments)   Shortness of breath, swelling  and hives   Shrimp [shellfish Allergy] Other (See Comments)   SWELLING    HIVES    SHORTNESS OF BREATH   Tetracycline Rash      Medication List    TAKE these medications   acetaminophen 325 MG tablet Commonly known as: TYLENOL Take 1-2 tablets (325-650 mg total) by mouth every 4 (four) hours as needed for mild pain.   Albuterol Sulfate 108 (90 Base) MCG/ACT Aepb Inhale 1 puff into the lungs every 6 (six) hours as needed (shortness of breath).   ascorbic acid 500 MG tablet Commonly known as: VITAMIN C Take 1 tablet (500 mg total) by mouth 2 (two) times daily. What changed: how much to take   atorvastatin 80 MG tablet Commonly known as: LIPITOR TAKE 1 TABLET BY MOUTH EVERY DAY   balsalazide 750 MG capsule Commonly known as: COLAZAL TAKE 1 CAPSULE (750 MG TOTAL) BY MOUTH 3 (THREE) TIMES DAILY. What changed:   how much to take  how to take this  when to take this  additional instructions   ciprofloxacin 500 MG tablet Commonly known as: CIPRO Take 1 tablet (500 mg total) by mouth 2 (two) times daily for 12 days.   collagenase ointment Commonly known as: SANTYL Apply 1 application topically daily. (apply to sacrum)   DECUBI-VITE PO Take 1 capsule by mouth daily.   digoxin 0.125 MG tablet Commonly known as: LANOXIN Take 0.0625 mg by mouth daily.   ferrous gluconate 324 MG tablet Commonly known as: FERGON Take 1 tablet (324 mg total) by mouth daily with breakfast.   finasteride 5 MG tablet Commonly known as: PROSCAR Take 1 tablet (5 mg total) by mouth daily.   Florastor 250 MG capsule Generic drug: saccharomyces boulardii Take 250 mg by mouth 3 (three) times daily.   HYDROcodone-acetaminophen 5-325 MG tablet Commonly known as: NORCO/VICODIN Take 1-2 tablets by mouth every 4 (four) hours as needed for moderate pain.   insulin aspart 100 UNIT/ML injection Commonly known as: novoLOG Inject 5 Units into the skin 3 (three) times daily with meals. What  changed:   how much to take  when to take this  additional instructions   insulin glargine 100 UNIT/ML injection Commonly known as: LANTUS Inject 5 Units into the skin daily.   ipratropium-albuterol 0.5-2.5 (3) MG/3ML Soln Commonly known as: DUONEB Take 3 mLs by nebulization every 4 (four) hours as needed (shortness of breath or wheezing).   midodrine 10 MG tablet Commonly known as: PROAMATINE Take 1 tablet (10 mg total) by mouth 3 (three) times daily.   omeprazole 20 MG capsule Commonly known as: PRILOSEC Take 20 mg by mouth daily.   ondansetron 4 MG tablet Commonly known as: ZOFRAN Take 4 mg by mouth every 8 (eight) hours as needed for nausea or vomiting.   tamsulosin 0.4 MG Caps capsule Commonly known as: FLOMAX Take 1 capsule (0.4 mg total) by mouth daily after breakfast.   Xarelto 20 MG Tabs tablet Generic drug: rivaroxaban TAKE 1 TABLET BY MOUTH EVERY DAY WITH LUNCH What changed: See the new instructions.       Allergies  Allergen Reactions  . Iodine Other (See Comments)  Shortness of breath, swelling and hives  . Shrimp [Shellfish Allergy] Other (See Comments)    SWELLING    HIVES    SHORTNESS OF BREATH  . Tetracycline Rash    Consultations: Cardiology  Procedures/Studies: DG Chest 2 View  Result Date: 10/26/2019 CLINICAL DATA:  Chest pain EXAM: CHEST - 2 VIEW COMPARISON:  10/01/2019 FINDINGS: There is a dual lead cardiac pacemaker present. There is no focal consolidation. There is no pleural effusion or pneumothorax. The heart and mediastinal contours are unremarkable. There is no acute osseous abnormality. IMPRESSION: No active cardiopulmonary disease. Electronically Signed   By: Kathreen Devoid   On: 10/26/2019 07:07   US RENAL  Result Date: 10/11/2019 CLINICAL DATA:  Acute kidney injury EXAM: RENAL ULTRASOUND COMPARISON:  November 03, 2018 FINDINGS: Right Kidney: Renal measurements: 12.8 x 6.4 x 5.4 cm = volume: 234.4 mL . Echogenicity and renal  cortical thickness are within normal limits. No mass, perinephric fluid, or hydronephrosis visualized. No sonographically demonstrable calculus or ureterectasis. Left Kidney: Renal measurements: 13.4 x 5.2 x 6.5 cm = volume: 238.4 mL. Echogenicity and renal cortical thickness are within normal limits. No mass, perinephric fluid, or hydronephrosis visualized. No sonographically demonstrable calculus or ureterectasis. Bladder: Decompressed with Foley catheter and cannot be assessed. Other: None. IMPRESSION: Normal appearing kidneys bilaterally.  Urinary bladder decompressed. Electronically Signed   By: Lowella Grip III M.D.   On: 10/11/2019 15:23   DG Chest Port 1 View  Result Date: 10/01/2019 CLINICAL DATA:  PICC line placement EXAM: PORTABLE CHEST 1 VIEW COMPARISON:  09/23/2019 FINDINGS: Right PICC line is been placed with the tip in the SVC. Left AICD is unchanged. Cardiomegaly. No confluent opacities or effusions. No acute bony abnormality. IMPRESSION: Right PICC line tip in the SVC. Cardiomegaly. No acute cardiopulmonary disease. Electronically Signed   By: Rolm Baptise M.D.   On: 10/01/2019 12:13   ECHOCARDIOGRAM COMPLETE  Result Date: 09/28/2019    ECHOCARDIOGRAM REPORT   Patient Name:   CORDARYL DECELLES Date of Exam: 09/28/2019 Medical Rec #:  [id number]        Height:       71.0 in Accession #:    [id number]       Weight:       240.0 lb Date of Birth:  Jun 19, 1951        BSA:          2.278 m Patient Age:    41 years         BP:           107/50 mmHg Patient Gender: M                HR:           83 bpm. Exam Location:  [facility] Procedure: 2D Echo Indications:     BACTEREMIA 790.7/R78.81  History:         Patient has prior history of Echocardiogram examinations, most                  recent 06/25/2019. CAD, Signs/Symptoms:Syncope; Risk                  Factors:Dyslipidemia, Diabetes and Hypertension.  Sonographer:     Avanell Shackleton Referring Phys:  [id number] Tsosie Billing Diagnosing Phys: Ida Rogue MD  Sonographer Comments: Technically difficult study due to poor echo windows, suboptimal parasternal window, suboptimal apical window and Technically challenging study due to limited acoustic windows. THERE IS ALOT OF ARTIFACT  PRESENT DUE TO THE INTERFERENCE FROM THE EQUIPMENT IN THE COVID UNIT IMPRESSIONS  1. Left ventricular ejection fraction, by estimation, is 25 to 30%. The left ventricle has severely decreased function. The left ventricle demonstrates regional wall motion abnormalities , anterior , anterospetal and apical wall hypokinesis.  2. Right ventricular systolic function is normal. The right ventricular size is normal. There is mildly elevated pulmonary artery systolic pressure.  3. Left atrial size was mildly dilated.  4. No vegetation noted. FINDINGS  Left Ventricle: Left ventricular ejection fraction, by estimation, is 25 to 30%. The left ventricle has severely decreased function. The left ventricle demonstrates regional wall motion abnormalities. The left ventricular internal cavity size was normal  in size. There is no left ventricular hypertrophy. Left ventricular diastolic parameters are indeterminate. Right Ventricle: The right ventricular size is normal. No increase in right ventricular wall thickness. Right ventricular systolic function is normal. There is mildly elevated pulmonary artery systolic pressure. The tricuspid regurgitant velocity is 2.66  m/s, and with an assumed right atrial pressure of 10 mmHg, the estimated right ventricular systolic pressure is 12.8 mmHg. Left Atrium: Left atrial size was mildly dilated. Right Atrium: Right atrial size was normal in size. Pericardium: Trivial pericardial effusion is present. Mitral Valve: The mitral valve is normal in structure and function. Normal mobility of the mitral valve leaflets. No evidence of mitral valve regurgitation. No evidence of mitral valve stenosis. Tricuspid Valve: The tricuspid valve is normal in structure. Tricuspid  valve regurgitation is not demonstrated. No evidence of tricuspid stenosis. Aortic Valve: The aortic valve is normal in structure and function. Aortic valve regurgitation is not visualized. No aortic stenosis is present. Pulmonic Valve: The pulmonic valve was normal in structure. Pulmonic valve regurgitation is not visualized. No evidence of pulmonic stenosis. Aorta: The aortic root is normal in size and structure. Venous: The inferior vena cava is normal in size with greater than 50% respiratory variability, suggesting right atrial pressure of 3 mmHg. IAS/Shunts: No atrial level shunt detected by color flow Doppler.  LEFT VENTRICLE PLAX 2D LVIDd:         5.07 cm LVIDs:         4.27 cm LV PW:         0.81 cm LV IVS:        1.04 cm  LEFT ATRIUM         Index LA diam:    4.00 cm 1.76 cm/m   AORTA Ao Root diam: 4.70 cm TRICUSPID VALVE TR Peak grad:   28.3 mmHg TR Vmax:        266.00 cm/s Ida Rogue MD Electronically signed by Ida Rogue MD Signature Date/Time: 09/28/2019/2:38:15 PM    Final    DG HIP UNILAT WITH PELVIS 2-3 VIEWS LEFT  Result Date: 10/01/2019 CLINICAL DATA:  Left hip pain without known injury. EXAM: DG HIP (WITH OR WITHOUT PELVIS) 2-3V LEFT COMPARISON:  None. FINDINGS: There is no evidence of hip fracture or dislocation. There is no evidence of arthropathy or other focal bone abnormality. IMPRESSION: Negative. Electronically Signed   By: Marijo Conception M.D.   On: 10/01/2019 16:27   [place] EKG SITE RITE  Result Date: 09/30/2019 If Site Rite image not attached, placement could not be confirmed due to current cardiac rhythm.      Subjective:   Discharge Exam: Vitals:   10/27/19 0700 10/27/19 0724  BP:  106/73  Pulse: 95 (!) 106  Resp: 12 16  Temp:  97.7 F (  36.5 C)  SpO2: 97%    Vitals:   10/27/19 0413 10/27/19 0500 10/27/19 0700 10/27/19 0724  BP: (!) 109/57   106/73  Pulse: 91 89 95 (!) 106  Resp: (!) 21 15 12 16   Temp: 98.5 F (36.9 C)   97.7 F (36.5 C)  TempSrc:  Oral   Oral  SpO2: 100% 100% 97%     General: Pt is alert, awake, not in acute distress Cardiovascular: RRR, S1/S2 +, no rubs, no gallops Respiratory: CTA bilaterally, no wheezing, no rhonchi Abdominal: Soft, NT, ND, bowel sounds + Extremities: no edema, no cyanosis    The results of significant diagnostics from this hospitalization (including imaging, microbiology, ancillary and laboratory) are listed below for reference.     Microbiology: Recent Results (from the past 240 hour(s))  SARS CORONAVIRUS 2 (TAT 6-24 HRS) Nasopharyngeal Nasopharyngeal Swab     Status: None   Collection Time: 10/26/19  8:33 AM   Specimen: Nasopharyngeal Swab  Result Value Ref Range Status   SARS Coronavirus 2 NEGATIVE NEGATIVE Final    Comment: (NOTE) SARS-CoV-2 target nucleic acids are NOT DETECTED. The SARS-CoV-2 RNA is generally detectable in upper and lower respiratory specimens during the acute phase of infection. Negative results do not preclude SARS-CoV-2 infection, do not rule out co-infections with other pathogens, and should not be used as the sole basis for treatment or other patient management decisions. Negative results must be combined with clinical observations, patient history, and epidemiological information. The expected result is Negative. Fact Sheet for Patients: SugarRoll.be Fact Sheet for Healthcare Providers: https://www.woods-mathews.com/ This test is not yet approved or cleared by the [place] FDA and  has been authorized for detection and/or diagnosis of SARS-CoV-2 by FDA under an Emergency Use Authorization (EUA). This EUA will remain  in effect (meaning this test can be used) for the duration of the COVID-19 declaration under Section 56 4(b)(1) of the Act, 21 U.S.C. section 360bbb-3(b)(1), unless the authorization is terminated or revoked sooner. Performed at [facility], [place] [address], [place], [place] [address]    MRSA PCR Screening     Status: None   Collection Time: 10/26/19  3:41 PM   Specimen: Nasal Mucosa; Nasopharyngeal  Result Value Ref Range Status   MRSA by PCR NEGATIVE NEGATIVE Final    Comment:        The GeneXpert MRSA Assay (FDA approved for NASAL specimens only), is one component of a comprehensive MRSA colonization surveillance program. It is not intended to diagnose MRSA infection nor to guide or monitor treatment for MRSA infections. Performed at [facility], [place] [address] [place], [place] [address]      Labs: BNP (last 3 results) Recent Labs    06/12/19 0100 10/11/19 0933 10/26/19 0716  BNP 731.0* 1,655.0* 154.0*   Basic Metabolic Panel: Recent Labs  Lab 10/21/19 0519 10/22/19 0443 10/23/19 0500 10/26/19 0716 10/27/19 0757  NA 140 143 139 138 138  K 3.8 3.9 3.5 3.1* 3.9  CL 105 107 104 101 102  CO2 27 27 27 24 26   GLUCOSE 122* 107* 102* 94 113*  BUN 29* 28* 29* 19 16  CREATININE 1.82* 1.76* 1.69* 1.68* 1.53*  CALCIUM 8.0* 8.2* 8.1* 8.5* 8.4*  MG 1.8 1.6* 1.8  --   --   PHOS  --  2.2* 2.8  --   --    Liver Function Tests: Recent Labs  Lab 10/26/19 0716  AST 25  ALT 10  ALKPHOS  133*  BILITOT 0.7  PROT 6.6  ALBUMIN 2.2*   Recent Labs  Lab 10/26/19 0716  LIPASE 37   No results for input(s): AMMONIA in the last 168 hours. CBC: Recent Labs  Lab 10/21/19 0519 10/22/19 0443 10/23/19 0500 10/26/19 0716 10/27/19 0757  WBC 13.2* 14.0* 13.4* 16.6* 15.9*  NEUTROABS 8.7* 9.6* 9.1*  --  12.8*  HGB 8.0* 8.2* 8.3* 8.3* 8.4*  HCT 24.7* 26.0* 26.0* 26.8* 26.1*  MCV 86.7 87.8 85.8 88.4 86.4  PLT 252 284 284 378 376   Cardiac Enzymes: No results for input(s): CKTOTAL, CKMB, CKMBINDEX, TROPONINI in the last 168 hours. BNP: Invalid input(s): POCBNP CBG: Recent Labs  Lab 10/26/19 1233 10/26/19 1638 10/26/19 2115 10/27/19 0615  GLUCAP 75 110* 118* 90   D-Dimer No results for input(s): DDIMER in the last 72 hours. Hgb A1c No  results for input(s): HGBA1C in the last 72 hours. Lipid Profile Recent Labs    10/26/19 0927  CHOL 105  HDL 25*  LDLCALC 57  TRIG 113  CHOLHDL 4.2   Thyroid function studies No results for input(s): TSH, T4TOTAL, T3FREE, THYROIDAB in the last 72 hours.  Invalid input(s): FREET3 Anemia work up No results for input(s): VITAMINB12, FOLATE, FERRITIN, TIBC, IRON, RETICCTPCT in the last 72 hours. Urinalysis    Component Value Date/Time   COLORURINE YELLOW (A) 10/12/2019 1145   APPEARANCEUR CLOUDY (A) 10/12/2019 1145   LABSPEC 1.011 10/12/2019 1145   PHURINE 5.0 10/12/2019 1145   GLUCOSEU NEGATIVE 10/12/2019 1145   HGBUR NEGATIVE 10/12/2019 1145   BILIRUBINUR NEGATIVE 10/12/2019 1145   KETONESUR NEGATIVE 10/12/2019 1145   PROTEINUR 100 (A) 10/12/2019 1145   UROBILINOGEN 1.0 11/27/2013 1017   NITRITE NEGATIVE 10/12/2019 1145   LEUKOCYTESUR LARGE (A) 10/12/2019 1145   Sepsis Labs Invalid input(s): PROCALCITONIN,  WBC,  LACTICIDVEN Microbiology Recent Results (from the past 240 hour(s))  SARS CORONAVIRUS 2 (TAT 6-24 HRS) Nasopharyngeal Nasopharyngeal Swab     Status: None   Collection Time: 10/26/19  8:33 AM   Specimen: Nasopharyngeal Swab  Result Value Ref Range Status   SARS Coronavirus 2 NEGATIVE NEGATIVE Final    Comment: (NOTE) SARS-CoV-2 target nucleic acids are NOT DETECTED. The SARS-CoV-2 RNA is generally detectable in upper and lower respiratory specimens during the acute phase of infection. Negative results do not preclude SARS-CoV-2 infection, do not rule out co-infections with other pathogens, and should not be used as the sole basis for treatment or other patient management decisions. Negative results must be combined with clinical observations, patient history, and epidemiological information. The expected result is Negative. Fact Sheet for Patients: SugarRoll.be Fact Sheet for Healthcare  Providers: https://www.woods-mathews.com/ This test is not yet approved or cleared by the [place] FDA and  has been authorized for detection and/or diagnosis of SARS-CoV-2 by FDA under an Emergency Use Authorization (EUA). This EUA will remain  in effect (meaning this test can be used) for the duration of the COVID-19 declaration under Section 56 4(b)(1) of the Act, 21 U.S.C. section 360bbb-3(b)(1), unless the authorization is terminated or revoked sooner. Performed at [facility], [place] [address], [place], [place] [address]   MRSA PCR Screening     Status: None   Collection Time: 10/26/19  3:41 PM   Specimen: Nasal Mucosa; Nasopharyngeal  Result Value Ref Range Status   MRSA by PCR NEGATIVE NEGATIVE Final    Comment:        The GeneXpert MRSA Assay (FDA approved for NASAL  specimens only), is one component of a comprehensive MRSA colonization surveillance program. It is not intended to diagnose MRSA infection nor to guide or monitor treatment for MRSA infections. Performed at [facility], [place] [address] [place], [place] [address]     Please note: You were cared for by a hospitalist during your hospital stay. Once you are discharged, your primary care physician will handle any further medical issues. Please note that NO REFILLS for any discharge medications will be authorized once you are discharged, as it is imperative that you return to your primary care physician (or establish a relationship with a primary care physician if you do not have one) for your post hospital discharge needs so that they can reassess your need for medications and monitor your lab values.    Time coordinating discharge: 40 minutes  SIGNED:   Shelly Coss, MD  [facility] 10/27/2019, 10:38 AM Pager [phone]  If 7PM-7AM, please contact night-coverage [email or web address] Password [email or web address]

## 2019-10-27 NOTE — Plan of Care (Signed)
Plan of care 

## 2019-10-27 NOTE — TOC Transition Note (Signed)
Transition of Care [facility]) - CM/SW Discharge Note   Patient Details  Name: LARENCE THONE MRN: [id number] Date of Birth: 31-Dec-1950  [facility] CM/SW Contact:  Gabrielle Dare Phone Number: 10/27/2019, 11:20 AM   Clinical Narrative:    Patient will Discharge To: [place] Anticipated DC Date:10/27/19 Family Notified:yes, son [name] [phone] Transport By: Corey Nyheim   Per MD patient ready for DC to [facility] . RN, patient, patient's family, and facility notified of DC.  Discharge Summary sent to facility. RN given number for report [phone], Room # [place].). DC packet on chart. Ambulance transport requested for patient at 12:30pm.  [place] signing off.  Reed Breech LCSWA [phone]     Final next level of care: Skilled Nursing Facility Barriers to Discharge: No Barriers Identified   Patient Goals and CMS Choice        Discharge Placement              Patient chooses bed at: [facility] Patient to be transferred to facility by: [place] Name of family member notified: [name] Patient and family notified of of transfer: 10/27/19  Discharge Plan and Services                                     Social Determinants of Health (SDOH) Interventions     Readmission Risk Interventions Readmission Risk Prevention Plan 10/14/2019 10/14/2019 07/18/2019  Transportation Screening Complete Complete Complete  Social Work Consult for [place] - - -  Medication Review Press photographer) Complete Complete Complete  PCP or Specialist appointment within 3-5 days of discharge Complete Complete (No Data)  [employer] or [place] Complete Complete -  [place] or Home Care Consult Pt Refusal Comments - - return to SNF  SW Recovery Care/Counseling Consult Complete Complete -  Palliative Care Screening Not Applicable Not Applicable -  [place] Not Applicable  Not Applicable Complete  Some recent data might be hidden

## 2019-10-27 NOTE — Care Management Obs Status (Signed)
[place] NOTIFICATION   Patient Details  Name: MERCEDES VALERIANO MRN: [id number] Date of Birth: 1951/03/07   Medicare Observation Status Notification Given:  Yes    Bartholomew Crews, RN 10/27/2019, 10:27 AM

## 2019-10-27 NOTE — Progress Notes (Signed)
Report called to Diane at [facility] who stated she knew the patient well. Copy of AVS to be sent to her with discharge paperwork.

## 2019-10-27 NOTE — Progress Notes (Signed)
Pt discharging via [facility]. IVs removed per protocol. No further needs.

## 2019-10-28 LAB — AEROBIC/ANAEROBIC CULTURE W GRAM STAIN (SURGICAL/DEEP WOUND)

## 2019-10-28 NOTE — Progress Notes (Signed)
ICD Remote  

## 2019-10-31 ENCOUNTER — Telehealth: Payer: Self-pay | Admitting: Cardiovascular Disease

## 2019-10-31 ENCOUNTER — Emergency Department (HOSPITAL_COMMUNITY): Payer: Medicare Other

## 2019-10-31 ENCOUNTER — Emergency Department (HOSPITAL_COMMUNITY)
Admission: EM | Admit: 2019-10-31 | Discharge: 2019-11-01 | Disposition: A | Discharge status: Home / Self Care | Payer: Medicare Other | Attending: Emergency Medicine | Admitting: Emergency Medicine

## 2019-10-31 DIAGNOSIS — Z7901 Long term (current) use of anticoagulants: Secondary | ICD-10-CM | POA: Insufficient documentation

## 2019-10-31 DIAGNOSIS — I4891 Unspecified atrial fibrillation: Secondary | ICD-10-CM | POA: Diagnosis not present

## 2019-10-31 DIAGNOSIS — Z8616 Personal history of COVID-19: Secondary | ICD-10-CM | POA: Insufficient documentation

## 2019-10-31 DIAGNOSIS — R079 Chest pain, unspecified: Secondary | ICD-10-CM

## 2019-10-31 DIAGNOSIS — Z794 Long term (current) use of insulin: Secondary | ICD-10-CM | POA: Diagnosis not present

## 2019-10-31 DIAGNOSIS — I2510 Atherosclerotic heart disease of native coronary artery without angina pectoris: Secondary | ICD-10-CM | POA: Insufficient documentation

## 2019-10-31 DIAGNOSIS — Z95811 Presence of heart assist device: Secondary | ICD-10-CM | POA: Diagnosis not present

## 2019-10-31 DIAGNOSIS — R0789 Other chest pain: Secondary | ICD-10-CM | POA: Insufficient documentation

## 2019-10-31 DIAGNOSIS — I110 Hypertensive heart disease with heart failure: Secondary | ICD-10-CM | POA: Diagnosis not present

## 2019-10-31 DIAGNOSIS — Z79899 Other long term (current) drug therapy: Secondary | ICD-10-CM | POA: Diagnosis not present

## 2019-10-31 DIAGNOSIS — I5022 Chronic systolic (congestive) heart failure: Secondary | ICD-10-CM | POA: Insufficient documentation

## 2019-10-31 DIAGNOSIS — E119 Type 2 diabetes mellitus without complications: Secondary | ICD-10-CM | POA: Insufficient documentation

## 2019-10-31 LAB — BASIC METABOLIC PANEL
Anion gap: 15 (ref 5–15)
BUN: 14 mg/dL (ref 8–23)
CO2: 21 mmol/L — ABNORMAL LOW (ref 22–32)
Calcium: 8.2 mg/dL — ABNORMAL LOW (ref 8.9–10.3)
Chloride: 103 mmol/L (ref 98–111)
Creatinine, Ser: 1.21 mg/dL (ref 0.61–1.24)
GFR calc Af Amer: >60 mL/min (ref >60)
GFR calc non Af Amer: >60 mL/min (ref >60)
Glucose, Bld: 103 mg/dL — ABNORMAL HIGH (ref 70–99)
Potassium: 3.2 mmol/L — ABNORMAL LOW (ref 3.5–5.1)
Sodium: 139 mmol/L (ref 135–145)

## 2019-10-31 LAB — CBC WITH DIFFERENTIAL/PLATELET
Abs Immature Granulocytes: 0.06 10*3/uL (ref 0.00–0.07)
Basophils Absolute: 0.1 10*3/uL (ref 0.0–0.1)
Basophils Relative: 1 %
Eosinophils Absolute: 0.9 10*3/uL — ABNORMAL HIGH (ref 0.0–0.5)
Eosinophils Relative: 7 %
HCT: 26.3 % — ABNORMAL LOW (ref 39.0–52.0)
Hemoglobin: 8.3 g/dL — ABNORMAL LOW (ref 13.0–17.0)
Immature Granulocytes: 0 %
Lymphocytes Relative: 14 %
Lymphs Abs: 2 10*3/uL (ref 0.7–4.0)
MCH: 28 pg (ref 26.0–34.0)
MCHC: 31.6 g/dL (ref 30.0–36.0)
MCV: 88.9 fL (ref 80.0–100.0)
Monocytes Absolute: 0.8 10*3/uL (ref 0.1–1.0)
Monocytes Relative: 6 %
Neutro Abs: 10.1 10*3/uL — ABNORMAL HIGH (ref 1.7–7.7)
Neutrophils Relative %: 72 %
Platelets: 470 10*3/uL — ABNORMAL HIGH (ref 150–400)
RBC: 2.96 MIL/uL — ABNORMAL LOW (ref 4.22–5.81)
RDW: 18.4 % — ABNORMAL HIGH (ref 11.5–15.5)
WBC: 14 10*3/uL — ABNORMAL HIGH (ref 4.0–10.5)
nRBC: 0 % (ref 0.0–0.2)

## 2019-10-31 LAB — TROPONIN I (HIGH SENSITIVITY)
Troponin I (High Sensitivity): 22 ng/L — ABNORMAL HIGH (ref <18)
Troponin I (High Sensitivity): 25 ng/L — ABNORMAL HIGH (ref <18)

## 2019-10-31 MED ORDER — DILTIAZEM HCL 25 MG/5ML IV SOLN
15.0000 mg | Freq: Once | INTRAVENOUS | Status: AC
  Administered 2019-10-31: 15 mg via INTRAVENOUS
  Filled 2019-10-31: qty 5

## 2019-10-31 NOTE — Telephone Encounter (Signed)
Spoke with Janett Billow at [facility].  Patient is now there and not [facility] [facility].  Arranged hospital virtual visit.     Consent faxed to [phone]

## 2019-10-31 NOTE — ED Notes (Signed)
Called [facility] for transport to [facility]

## 2019-10-31 NOTE — Discharge Instructions (Addendum)
Your digoxin level was undetectable. Please ensure you are taking it as prescribed and discuss with your primary doctor if you continue to have bouts of fast heart rate.

## 2019-10-31 NOTE — ED Triage Notes (Signed)
Pt presents with c/o midsternal Cp into L arm x 2 hours.  Pt at [facility] place following pneumonia for rehab.  Hx of pressure ulcer with to decub with ostomy placed in Aug 2020 to divert stool for healing.  Hx of pacer/defibrillator and has gone off twice today. Seen here last weekend for similar sx. Diagnosed with pulled muscle.  Pt alert and oriented, thought he was at [facility] at first.

## 2019-10-31 NOTE — ED Notes (Signed)
Report called to [facility].  Patient going to [place] [facility].

## 2019-10-31 NOTE — ED Notes (Signed)
Interrogating Medtronic Pacemaker per MD orders.

## 2019-11-01 ENCOUNTER — Other Ambulatory Visit: Payer: Self-pay

## 2019-11-01 DIAGNOSIS — R0789 Other chest pain: Secondary | ICD-10-CM | POA: Diagnosis not present

## 2019-11-01 LAB — URINALYSIS, ROUTINE W REFLEX MICROSCOPIC
Bacteria, UA: NONE SEEN
Bilirubin Urine: NEGATIVE
Glucose, UA: NEGATIVE mg/dL
Hgb urine dipstick: NEGATIVE
Ketones, ur: NEGATIVE mg/dL
Nitrite: NEGATIVE
Protein, ur: 100 mg/dL — AB
Specific Gravity, Urine: 1.016 (ref 1.005–1.030)
WBC, UA: >50 WBC/hpf — ABNORMAL HIGH (ref 0–5)
pH: 5 (ref 5.0–8.0)

## 2019-11-01 LAB — DIGOXIN LEVEL: Digoxin Level: <0.2 ng/mL — ABNORMAL LOW (ref 0.8–2.0)

## 2019-11-01 MED ORDER — DIGOXIN 125 MCG PO TABS: 0.1250 mg | ORAL_TABLET | Freq: Every day | ORAL | Status: AC

## 2019-11-01 MED ORDER — LACTATED RINGERS IV BOLUS
1000.0000 mL | Freq: Once | INTRAVENOUS | Status: AC
  Administered 2019-11-01: 1000 mL via INTRAVENOUS

## 2019-11-01 MED ORDER — DILTIAZEM HCL 25 MG/5ML IV SOLN: 15.0000 mg | Freq: Once | INTRAVENOUS | Status: DC

## 2019-11-01 MED ORDER — DILTIAZEM HCL 60 MG PO TABS
60.0000 mg | ORAL_TABLET | Freq: Once | ORAL | Status: DC
  Filled 2019-11-01: qty 1

## 2019-11-01 MED ORDER — DIGOXIN 125 MCG PO TABS
0.1250 mg | ORAL_TABLET | Freq: Once | ORAL | Status: AC
  Administered 2019-11-01: 0.125 mg via ORAL
  Filled 2019-11-01: qty 1

## 2019-11-01 NOTE — ED Notes (Signed)
Patient found in rapid afib upon arrival of [facility].  Patient moved to treatment room.  Dr Dayna Barker notified.  New orders per Dr Dayna Barker.

## 2019-11-01 NOTE — ED Notes (Signed)
[facility] called again for transport to [facility]

## 2019-11-01 NOTE — ED Provider Notes (Signed)
2:13 AM  I was asked by nursing to see the patient as apparently he had been sitting in the hallway waiting for discharge for over 2 hours.  Patient was in atrial fibrillation with rapid ventricular response.  It appears from looking through the records that he had this earlier and was given a dose of diltiazem just before midnight.  Patient has a history of the same is on digoxin for it.  On my evaluation the patient is hemodynamically stable aside from the A. fib RVR.  He is on Xarelto.  Nursing states that the patient might have been in normal sinus rhythm earlier but has not been on a monitor since he has been discharged.  Review of his labs it appears that his digoxin level has been low in the past so we will recheck that now give him another dose.  We will also give little bit of fluid.  Check a urine as it looks like it could be infected.  Will monitor for a few more hours to ensure improvement. If not, may need admission for same.   Digoxin level undetectable. Given a dose here. VSS. patietn feels better. Still slightly tachycardic but not in rvr. Urine not infected. Fluids helped. Needs pcp follow up.   CRITICAL CARE Performed by: Merrily Pew Total critical care time: 35 minutes Critical care time was exclusive of separately billable procedures and treating other patients. Critical care was necessary to treat or prevent imminent or life-threatening deterioration. Critical care was time spent personally by me on the following activities: development of treatment plan with patient and/or surrogate as well as nursing, discussions with consultants, evaluation of patient's response to treatment, examination of patient, obtaining history from patient or surrogate, ordering and performing treatments and interventions, ordering and review of laboratory studies, ordering and review of radiographic studies, pulse oximetry and re-evaluation of patient's condition.   Discharge instructions, including  strict return precautions for new or worsening symptoms, given. Patient and/or family verbalized understanding and agreement with the plan as described.   Labs, studies and imaging reviewed by myself and considered in medical decision making if ordered. Imaging interpreted by radiology.  Labs Reviewed  CBC WITH DIFFERENTIAL/PLATELET - Abnormal; Notable for the following components:      Result Value   WBC 14.0 (*)    RBC 2.96 (*)    Hemoglobin 8.3 (*)    HCT 26.3 (*)    RDW 18.4 (*)    Platelets 470 (*)    Neutro Abs 10.1 (*)    Eosinophils Absolute 0.9 (*)    All other components within normal limits  BASIC METABOLIC PANEL - Abnormal; Notable for the following components:   Potassium 3.2 (*)    CO2 21 (*)    Glucose, Bld 103 (*)    Calcium 8.2 (*)    All other components within normal limits  TROPONIN I (HIGH SENSITIVITY) - Abnormal; Notable for the following components:   Troponin I (High Sensitivity) 22 (*)    All other components within normal limits  TROPONIN I (HIGH SENSITIVITY) - Abnormal; Notable for the following components:   Troponin I (High Sensitivity) 25 (*)    All other components within normal limits  URINE CULTURE  URINALYSIS, ROUTINE W REFLEX MICROSCOPIC    DG Chest Portable 1 View  Final Result      No follow-ups on file.    Shashank Kwasnik, Corene Cornea, MD 11/01/19 [phone]

## 2019-11-02 LAB — URINE CULTURE: Culture: >=100000 — AB

## 2019-11-03 ENCOUNTER — Telehealth: Payer: Self-pay | Admitting: *Deleted

## 2019-11-03 NOTE — Telephone Encounter (Signed)
Post ED Visit - Positive Culture Follow-up  Culture report reviewed by antimicrobial stewardship pharmacist: [place] Team []  Elenor Quinones, Pharm.D. []  Heide Guile, Pharm.D., BCPS AQ-ID []  Parks Neptune, Pharm.D., BCPS []  Alycia Rossetti, Pharm.D., BCPS []  [place], [place].D., BCPS, AAHIVP []  Legrand Como, Pharm.D., BCPS, AAHIVP []  Salome Arnt, PharmD, BCPS []  Johnnette Gourd, PharmD, BCPS []  Hughes Better, PharmD, BCPS []  Leeroy Cha, PharmD []  Laqueta Linden, PharmD, BCPS []  Albertina Parr, PharmD  [place] Team []  Leodis Sias, PharmD []  Lindell Spar, PharmD []  Royetta Asal, PharmD []  Graylin Shiver, Rph []  Rema Fendt) Glennon Mac, PharmD []  Arlyn Dunning, PharmD []  Netta Cedars, PharmD []  Dia Sitter, PharmD []  Leone Haven, PharmD []  Gretta Arab, PharmD []  Theodis Shove, PharmD []  Peggyann Juba, PharmD []  Reuel Boom, PharmD   Positive urine culture, reviewed by Arlean Hopping, PA Asymptomatic and no further patient follow-up is required at this time.  [facility] 11/03/2019, 11:01 AM

## 2019-11-05 ENCOUNTER — Telehealth: Payer: Self-pay | Admitting: Infectious Diseases

## 2019-11-05 ENCOUNTER — Other Ambulatory Visit: Payer: Self-pay

## 2019-11-05 ENCOUNTER — Inpatient Hospital Stay: Payer: Medicare Other | Admitting: Infectious Diseases

## 2019-11-05 NOTE — Telephone Encounter (Signed)
[facility] and tried to get ahold of patient or nurse twice  Cannot reach anyone

## 2019-11-12 NOTE — Progress Notes (Signed)
Virtual Visit via Video Note   This visit type was conducted due to national recommendations for restrictions regarding the COVID-19 Pandemic (e.g. social distancing) in an effort to limit this patient's exposure and mitigate transmission in our community.  Due to his co-morbid illnesses, this patient is at least at moderate risk for complications without adequate follow up.  This format is felt to be most appropriate for this patient at this time.  All issues noted in this document were discussed and addressed.  A limited physical exam was performed with this format.  Please refer to the patient's chart for his consent to telehealth for [facility].   I connected with  Colton Lamb on 11/13/19 by a video enabled telemedicine application and verified that I am speaking with the correct person using two identifiers. I discussed the limitations of evaluation and management by telemedicine. The patient expressed understanding and agreed to proceed.   Evaluation Performed:  Follow-up visit  Date:  11/13/2019   ID:  Colton Lamb, DOB 03-15-1951, MRN [id number]  Patient Location:  [place] [address]   Provider location:   [facility], [place] office  PCP:  Colton Pitch, MD  Cardiologist:  Colton Lamb [facility]   Chief Complaint  Patient presents with  . other    Follow up from [name]. Pt. c/o chest pain and shortness of breath last week but since has not c/o of any symptoms.      History of Present Illness:    Colton Lamb is a 69 y.o. male who presents via audio/video conferencing for a telehealth visit today.   The patient does not symptoms concerning for COVID-19 infection (fever, chills, cough, or new SHORTNESS OF BREATH).   Patient has a past medical history of paroxysmal atrial fibrillation,   hyperlipidemia,  Crohn's ileocolitis,  coronary artery disease, PCI x3 to the LAD, circumflex, PDA and PL branch, catheterization in 2008   with a cypher stent to the OM,  Ejection fraction 30-35% in July 2015 by echo sleep apnea, has not been using CPAP poorly controlled diabetes, S/p catheter ablation of atrial flutter and insertion of a DDD ICD. no syncope or ICD shock. He presents today for follow-up of his coronary artery disease, atrial fibrillation  Several long hospital courses  reviewed Recent hospitalization with paroxysmal atrial fibrillation He is on Xarelto  Recent note from the emergency room at 2 AM, from November 01, 2019 noted to have atrial fibrillation with RVR He did present to the emergency room with midsternal chest pain He is at [facility] for pneumonia recovery Long history of pressure ulcer, deep decubitus with ostomy August 2020 diverting stool for healing Note indicates pacer/fibrillator had gone off twice Had been seen 1 week earlier for similar symptoms Was diagnosed with pulled muscle He was given digoxin Lab work at that time potassium 3.2 hemoglobin 8.3 WBC 14 Pacer interrogation details not available in the epic note Suspect if there was a defibrillation it was secondary to atrial fibrillation with RVR  EKG last week at facility, Result not available.  Nurse has looked in the chart, he does not there  We reviewed with the nurse all of the recent vital signs over the past week, no heart rates above 100 typically 80 to 90s Blood pressures 112 up to 120 on the midodrine 3 times daily  PT is getting him out of bed up to a  Recliner as much as tolerated  No new complaints No recent episodes of chest pain  Review of the chart in terms of his atrial fibrillation, Was not on amiodarone during recent admission October 26, 2019, was not discharged on amiodarone  He was not discharged on amiodarone October 24, 2019 evaluated in the hospital by EP cardiology October 19, 2019, recommendations made at that time for his atrial fibrillation  Other past medical history reviewed Long hospital course  03/2019, Spent most of the month in the hospital Diagnosed with sepsis, pleural effusion, renal failure, empyema Had septic shock from pneumonia Treated with broad-spectrum antibiotics Felt it could have been from aspiration Had hypoxic respiratory failure, was weaned off the oxygen Creatinine up to 4 down to 1.7 at time of discharge Torsemide was held at that time May have started to develop sacral wound at that time  04/21/19 admitted Sepsis, pressure sacral injury Anemia, transfusion x 3  Recently seen in the emergency room April 30, 2019 for shortness of breath CT scan chest with bilateral pleural effusions  Albumin is around 2   Admitted in April 2015 with atrial flutter, non-ST elevation MI, with cardioversion, cardiac catheterization showing occluded LAD,  moderate distal RCA disease, patent left circumflex.. Medical management recommended.  Severely depressed ejection fraction noted, LifeVest was placed at that time. Ejection fraction 20-25%   hospital admission May 13 with discharge 12/14/2013 with dig toxicity, junctional rhythm, bradycardia with heart rates in the 30s, syncope.  Acute renal failure likely secondary to ATN. Also noted to have TSH of 10. ACE inhibitor, spironolactone, isosorbide was held. Renal function improved at the time of discharge  Followup echocardiogram 01/29/2014 showing improved ejection fraction up to 30-35%   Prior CV studies:   The following studies were reviewed today:    Past Medical History:  Diagnosis Date  . Atrial flutter ([place])    a. s/p Cardioversion 11/22/13, on amiodarone and Xarelto.  . Chronic osteomyelitis ([place]) 06/30/2019   s/p colostomy  . Chronic systolic heart failure ([place])    a. 10/2013 EF 20-25%, grade III DD, RV mildly dilated and sys fx mild/mod reduced;  b. 01/2014 Echo: EF 30-35%, gr3 DD, mod dil LA. c. 07/2019 EF 20-25%  . Coronary artery disease    a. s/p MI 2007/2015;  b. s/p prior PCI to the  LAD/LCX/PDA/PL;  c. 2008: s/p Cypher DES to the OM.  Marland Kitchen Crohn's ileocolitis ([place])   . GERD (gastroesophageal reflux disease)   . Hx of adenomatous colonic polyps 11/2003  . Hyperlipidemia   . Hypertension   . Ischemic cardiomyopathy    s. 01/2014 s/p MDT [id number] [name] XT DR single lead AICD.  Marland Kitchen Obesity   . Paroxysmal atrial fibrillation (HCC)    a. CHA2DS2VASc = (CHF, HTN, agex1, DM)  . Sleep apnea   . Syncope    a.  11/2013 in setting of volume depletion and bradycardia due to dig toxicity   . Type II diabetes mellitus ([place])    Past Surgical History:  Procedure Laterality Date  . ATRIAL FLUTTER ABLATION N/A 04/16/2014   Procedure: ATRIAL FLUTTER ABLATION;  Surgeon: Evans Lance, MD;  Location: [facility] CATH LAB;  Service: Cardiovascular;  Laterality: N/A;  . CARDIAC CATHETERIZATION  10/2013  . CARDIAC DEFIBRILLATOR PLACEMENT  04/16/2014   Medtronic Evira device  . CARDIAC ELECTROPHYSIOLOGY STUDY AND ABLATION  04/16/2014   atrial flutter ablation  . CARDIOVERSION N/A 03/05/2014   Procedure: CARDIOVERSION;  Surgeon: Jolaine Artist, MD;  Location: [place];  Service: Cardiovascular;  Laterality: N/A;  . CATARACT EXTRACTION W/PHACO Lamb 01/04/2017   Procedure: CATARACT EXTRACTION PHACO AND INTRAOCULAR LENS PLACEMENT ([place])  Lamb Diabetic Complicated;  Surgeon: Leandrew Koyanagi, MD;  Location: [place];  Service: Ophthalmology;  Laterality: Lamb;  Diabetic  . CATARACT EXTRACTION W/PHACO Left 02/08/2017   Procedure: CATARACT EXTRACTION PHACO AND INTRAOCULAR LENS PLACEMENT ([place]) left diabetic;  Surgeon: Leandrew Koyanagi, MD;  Location: [place];  Service: Ophthalmology;  Laterality: Left;  Diabetic - oral meds sleep apnea  . CORONARY ANGIOPLASTY WITH STENT PLACEMENT  2007; 2008 X 2   "1+1 ~ 1"  . FOOT SURGERY Left    bone spur  . HYDROCELE EXCISION Bilateral   . 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 IMPLANTABLE CARDIOVERTER  DEFIBRILLATOR IMPLANT N/A 04/16/2014   Procedure: IMPLANTABLE CARDIOVERTER DEFIBRILLATOR IMPLANT;  Surgeon: Evans Lance, MD;  Location[facility] CATH LAB;  Service: Cardiovascular;  Laterality: N/A;  . LEFT HEART CATHETERIZATION WITH CORONARY ANGIOGRAM N/A 11/22/2013   Procedure: LEFT HEART CATHETERIZATION WITH CORONARY ANGIOGRAM;  Surgeon: Sinclair Grooms, MD;  Location: [facility] CATH LAB;  Service: Cardiovascular;  Laterality: N/A;  . TRANSVERSE LOOP COLOSTOMY N/A 07/03/2019   Procedure: TRANSVERSE LOOP COLOSTOMY;  Surgeon: Ronny Bacon, MD;  Location: [facility] ORS;  Service: General;  Laterality: N/A;      Allergies:   Iodine, Shrimp [shellfish allergy], and Tetracycline   Social History   Tobacco Use  . Smoking status: Never Smoker  . Smokeless tobacco: Never Used  Substance Use Topics  . Alcohol use: No  . Drug use: No     Current Outpatient Medications on File Prior to Visit  Medication Sig Dispense Refill  . acetaminophen (TYLENOL) 325 MG tablet Take 1-2 tablets (325-650 mg total) by mouth every 4 (four) hours as needed for mild pain.    . Albuterol Sulfate 108 (90 Base) MCG/ACT AEPB Inhale 1 puff into the lungs every 6 (six) hours as needed (shortness of breath).     Marland Kitchen atorvastatin (LIPITOR) 80 MG tablet TAKE 1 TABLET BY MOUTH EVERY DAY (Patient taking differently: Take 80 mg by mouth daily. ) 90 tablet 1  . balsalazide (COLAZAL) 750 MG capsule TAKE 1 CAPSULE (750 MG TOTAL) BY MOUTH 3 (THREE) TIMES DAILY. (Patient taking differently: Take 750 mg by mouth 3 (three) times daily. ) 270 capsule 3  . digoxin (LANOXIN) 0.125 MG tablet Take 1 tablet (0.125 mg total) by mouth daily.    . ferrous sulfate 325 (65 FE) MG EC tablet Take 325 mg by mouth daily with breakfast.    . finasteride (PROSCAR) 5 MG tablet Take 1 tablet (5 mg total) by mouth daily. 30 tablet 0  . HYDROcodone-acetaminophen (NORCO/VICODIN) 5-325 MG tablet Take 1-2 tablets by mouth every 4 (four) hours as needed for moderate pain.  15 tablet 0  . insulin aspart (NOVOLOG) 100 UNIT/ML injection Inject 5 Units into the skin 3 (three) times daily with meals. (Patient taking differently: Inject 0-12 Units into the skin 2 (two) times daily. Sliding Scale:  70-200= 0 units, 201-250= 2 units, 251-300= 4 units, 301-350= 6 units, 351-400=8 units, 401-450= 10 units, 451-500= 12 units.) 10 mL 11  . ipratropium-albuterol (DUONEB) 0.5-2.5 (3) MG/3ML SOLN Take 3 mLs by nebulization every 4 (four) hours as needed (shortness of breath or wheezing).    . metFORMIN (GLUCOPHAGE) 500 MG tablet Take 250 mg by mouth daily.     . midodrine (PROAMATINE) 10 MG tablet Take 1 tablet (  10 mg total) by mouth 3 (three) times daily. 30 tablet 0  . Multiple Vitamins-Minerals (DECUBI-VITE PO) Take 1 capsule by mouth daily.     Marland Kitchen omeprazole (PRILOSEC) 20 MG capsule Take 20 mg by mouth daily.     . ondansetron (ZOFRAN-ODT) 4 MG disintegrating tablet Take 4 mg by mouth every 8 (eight) hours as needed for nausea or vomiting.     . saccharomyces boulardii (FLORASTOR) 250 MG capsule Take 250 mg by mouth 3 (three) times daily.    . tamsulosin (FLOMAX) 0.4 MG CAPS capsule Take 1 capsule (0.4 mg total) by mouth daily after breakfast. 30 capsule 0  . vitamin C (VITAMIN C) 500 MG tablet Take 1 tablet (500 mg total) by mouth 2 (two) times daily. (Patient taking differently: Take 1,000 mg by mouth 2 (two) times daily. ) 30 tablet 0  . XARELTO 20 MG TABS tablet TAKE 1 TABLET BY MOUTH EVERY DAY WITH LUNCH (Patient taking differently: Take 20 mg by mouth daily with lunch. ) 90 tablet 3  . [DISCONTINUED] ferrous gluconate (FERGON) 324 MG tablet Take 1 tablet (324 mg total) by mouth daily with breakfast. (Patient not taking: Reported on 10/31/2019) 30 tablet 0  . [DISCONTINUED] insulin glargine (LANTUS) 100 UNIT/ML injection Inject 5 Units into the skin daily.     No current facility-administered medications on file prior to visit.     Family Hx: The patient's family history  includes Breast cancer in his mother; Heart attack in his father; Heart disease in his father. There is no history of Colon cancer.  ROS:   Please see the history of present illness.    Review of Systems  Unable to perform ROS: Acuity of condition  All other systems reviewed and are negative.     Labs/Other Tests and Data Reviewed:    Recent Labs: 10/23/2019: Magnesium 1.8 10/26/2019: ALT 10; B Natriuretic Peptide 558.1 10/31/2019: BUN 14; Creatinine, Ser 1.21; Hemoglobin 8.3; Platelets 470; Potassium 3.2; Sodium 139   Recent Lipid Panel Lab Results  Component Value Date/Time   CHOL 105 10/26/2019 09:27 AM   CHOL 112 05/09/2016 08:30 AM   TRIG 113 10/26/2019 09:27 AM   HDL 25 (L) 10/26/2019 09:27 AM   HDL 35 (L) 05/09/2016 08:30 AM   CHOLHDL 4.2 10/26/2019 09:27 AM   LDLCALC 57 10/26/2019 09:27 AM   LDLCALC 57 05/09/2016 08:30 AM    Wt Readings from Last 3 Encounters:  11/13/19 198 lb 2 oz (89.9 kg)  11/01/19 250 lb (113.4 kg)  10/09/19 240 lb (108.9 kg)     Exam:    Vital Signs: Vital signs may also be detailed in the HPI BP 112/78   Pulse 85   Ht 5' 11"  (1.803 m)   Wt 198 lb 2 oz (89.9 kg)   BMI 27.63 kg/m   Wt Readings from Last 3 Encounters:  11/13/19 198 lb 2 oz (89.9 kg)  11/01/19 250 lb (113.4 kg)  10/09/19 240 lb (108.9 kg)   Temp Readings from Last 3 Encounters:  10/31/19 97.9 F (36.6 C) (Oral)  10/27/19 97.7 F (36.5 C) (Oral)  10/23/19 98.7 F (37.1 C) (Oral)   BP Readings from Last 3 Encounters:  11/13/19 112/78  11/01/19 117/70  10/27/19 107/71   Pulse Readings from Last 3 Encounters:  11/13/19 85  11/01/19 (!) 103  10/27/19 96     Well nourished, well developed male in no acute distress. Constitutional:  oriented to person, place, and time. No distress.  ASSESSMENT & PLAN:    Problem List Items Addressed This Visit      Cardiology Problems   Atrial fibrillation ([place])   Ischemic cardiomyopathy - Primary   Atrial flutter ([place])      Other   ICD (implantable cardioverter-defibrillator) in place   Type II diabetes mellitus ([place])    Other Visit Diagnoses    Chronic systolic heart failure ([place])       Coronary artery disease of native artery of native heart with stable angina pectoris (HCC)       Lymphedema         Paroxysmal atrial fibrillation On low-dose digoxin, 1 week ago with atrial fibrillation with RVR noted in the emergency room Notes indicating he is back on his Xarelto 20 mg daily Creatinine 1.2 GFR greater than 60 May need antiarrhythmic such as amiodarone for recurrent episodes  Hypotension Exacerbated by third spacing, sepsis or nutrition Has been in a supine position for several months Only now getting out of bed to recliner with PT at [place] midodrine 10 3 times daily, blood pressure stable  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  The importance of social distancing was discussed today.  Patient Risk:   After full review of this patients clinical status, I feel that they are at least moderate risk at this time.  Time:   Today, I have spent 25 minutes with the patient with telehealth technology discussing the cardiac and medical problems/diagnoses detailed above   Additional 10 min spent reviewing the chart prior to patient visit today Extensive review of notes, discussion with nursing   Medication Adjustments/Labs and Tests Ordered: Current medicines are reviewed at length with the patient today.  Concerns regarding medicines are outlined above.   Tests Ordered: Plans as above   Medication Changes: No changes made   Disposition: Follow-up in 3  month   Signed, Ida Rogue, MD  [place] Office [address], [place], [place] [address]

## 2019-11-13 ENCOUNTER — Other Ambulatory Visit: Payer: Self-pay

## 2019-11-13 ENCOUNTER — Telehealth (INDEPENDENT_AMBULATORY_CARE_PROVIDER_SITE_OTHER): Payer: Medicare Other | Admitting: Cardiovascular Disease

## 2019-11-13 ENCOUNTER — Encounter: Payer: Self-pay | Admitting: Cardiovascular Disease

## 2019-11-13 VITALS — BP 112/78 | HR 85 | Ht 71.0 in | Wt 198.1 lb

## 2019-11-13 DIAGNOSIS — Z794 Long term (current) use of insulin: Secondary | ICD-10-CM

## 2019-11-13 DIAGNOSIS — E119 Type 2 diabetes mellitus without complications: Secondary | ICD-10-CM

## 2019-11-13 DIAGNOSIS — I25118 Atherosclerotic heart disease of native coronary artery with other forms of angina pectoris: Secondary | ICD-10-CM

## 2019-11-13 DIAGNOSIS — I255 Ischemic cardiomyopathy: Secondary | ICD-10-CM

## 2019-11-13 DIAGNOSIS — I890 Lymphedema, not elsewhere classified: Secondary | ICD-10-CM

## 2019-11-13 DIAGNOSIS — I5022 Chronic systolic (congestive) heart failure: Secondary | ICD-10-CM

## 2019-11-13 DIAGNOSIS — I480 Paroxysmal atrial fibrillation: Secondary | ICD-10-CM

## 2019-11-13 DIAGNOSIS — E1159 Type 2 diabetes mellitus with other circulatory complications: Secondary | ICD-10-CM

## 2019-11-13 DIAGNOSIS — Z95810 Presence of automatic (implantable) cardiac defibrillator: Secondary | ICD-10-CM

## 2019-11-13 DIAGNOSIS — I4892 Unspecified atrial flutter: Secondary | ICD-10-CM

## 2019-11-13 NOTE — Patient Instructions (Addendum)
Copy of AVS faxed to [facility] @ [phone]  Medication Instructions:  No changes  If you need a refill on your cardiac medications before your next appointment, please call your pharmacy.    Lab work: No new labs needed   If you have labs (blood work) drawn today and your tests are completely normal, you will receive your results only by: Marland Kitchen MyChart Message (if you have MyChart) OR . A paper copy in the mail If you have any lab test that is abnormal or we need to change your treatment, we will call you to review the results.   Testing/Procedures: No new testing needed   Follow-Up: At [facility], you and your health needs are our priority.  As part of our continuing mission to provide you with exceptional heart care, we have created designated Provider Care Teams.  These Care Teams include your primary Cardiologist (physician) and Advanced Practice Providers (APPs -  Physician Assistants and Nurse Practitioners) who all work together to provide you with the care you need, when you need it.  . You will need a follow up appointment in 3 months (will likely be virtual) . Our scheduling team will call to arrange this ( patient is currently at [place] [phone])    . Providers on your designated Care Team:   . Murray Hodgkins, NP . Christell Faith, PA-C . Marrianne Mood, PA-C  Any Other Special Instructions Will Be Listed Below (If Applicable).  For educational health videos Log in to : [email or web address] Or : [email or web address], password : [email or web address]

## 2019-11-14 ENCOUNTER — Telehealth: Payer: Self-pay | Admitting: Cardiovascular Disease

## 2019-11-14 NOTE — Telephone Encounter (Signed)
lmov for Colton Lamb at [facility] to schedule an appt in July

## 2019-11-21 ENCOUNTER — Inpatient Hospital Stay: Payer: Medicare Other | Admitting: Infectious Diseases

## 2019-11-21 ENCOUNTER — Ambulatory Visit: Payer: Medicare Other | Admitting: Infectious Diseases

## 2019-11-21 ENCOUNTER — Telehealth: Payer: Self-pay | Admitting: Infectious Diseases

## 2019-11-21 NOTE — Telephone Encounter (Signed)
Colton Lamb called and left voicemail due to transportation issues - need to reschedule appt  I have tried to call back to reschedule - waiting on call back

## 2019-11-21 NOTE — Telephone Encounter (Signed)
Miscommunication with Ronney Lion - He will be coming to the appt per Jennifer(nurse) @ [facility]

## 2019-11-21 NOTE — Telephone Encounter (Signed)
PER MD patient cannot be seen in office  Will have to be televisit with Camera or follow up with Wound center

## 2019-11-22 NOTE — Telephone Encounter (Signed)
WILL FOLLOW UP WITH WOUND CARE

## 2019-11-26 ENCOUNTER — Inpatient Hospital Stay: Payer: Medicare Other | Admitting: Infectious Diseases

## 2019-11-26 ENCOUNTER — Other Ambulatory Visit: Payer: Self-pay

## 2019-11-26 ENCOUNTER — Encounter: Payer: Self-pay | Admitting: Infectious Diseases

## 2019-11-26 ENCOUNTER — Ambulatory Visit: Payer: Medicare Other | Attending: Infectious Diseases | Admitting: Infectious Diseases

## 2019-11-26 VITALS — BP 108/82 | HR 73 | Temp 97.3°F | Resp 18 | Ht 71.0 in

## 2019-11-26 DIAGNOSIS — I110 Hypertensive heart disease with heart failure: Secondary | ICD-10-CM | POA: Diagnosis not present

## 2019-11-26 DIAGNOSIS — I252 Old myocardial infarction: Secondary | ICD-10-CM | POA: Diagnosis not present

## 2019-11-26 DIAGNOSIS — Z79899 Other long term (current) drug therapy: Secondary | ICD-10-CM | POA: Diagnosis not present

## 2019-11-26 DIAGNOSIS — I5022 Chronic systolic (congestive) heart failure: Secondary | ICD-10-CM | POA: Diagnosis not present

## 2019-11-26 DIAGNOSIS — G4730 Sleep apnea, unspecified: Secondary | ICD-10-CM | POA: Diagnosis not present

## 2019-11-26 DIAGNOSIS — M4628 Osteomyelitis of vertebra, sacral and sacrococcygeal region: Secondary | ICD-10-CM | POA: Diagnosis not present

## 2019-11-26 DIAGNOSIS — Z7901 Long term (current) use of anticoagulants: Secondary | ICD-10-CM | POA: Insufficient documentation

## 2019-11-26 DIAGNOSIS — Z794 Long term (current) use of insulin: Secondary | ICD-10-CM | POA: Insufficient documentation

## 2019-11-26 DIAGNOSIS — E785 Hyperlipidemia, unspecified: Secondary | ICD-10-CM | POA: Insufficient documentation

## 2019-11-26 DIAGNOSIS — I2510 Atherosclerotic heart disease of native coronary artery without angina pectoris: Secondary | ICD-10-CM | POA: Diagnosis not present

## 2019-11-26 DIAGNOSIS — I480 Paroxysmal atrial fibrillation: Secondary | ICD-10-CM | POA: Insufficient documentation

## 2019-11-26 DIAGNOSIS — E119 Type 2 diabetes mellitus without complications: Secondary | ICD-10-CM | POA: Diagnosis not present

## 2019-11-26 DIAGNOSIS — L89154 Pressure ulcer of sacral region, stage 4: Secondary | ICD-10-CM

## 2019-11-26 NOTE — Patient Instructions (Signed)
You are here for follow up of the sacral wound You dont need any more antibiotics as the wound is healing well Need to continue diligent local wound care You will have to follow up with wound care clinic- Air mattress Moving in bed frequently and participating in PT Have foley and divering colostomy Need to drink plenty of water as your urine is very concentrated-   4/27   3/22   09/25/19   No follow up with ID is needed

## 2019-11-26 NOTE — Progress Notes (Signed)
NAME: Colton Lamb  DOB: 1950/08/26  MRN: [id number]  Date/Time: 11/26/2019 4:46 PM   Subjective:  Follow-up visit after recent sacral osteomyelitis and stage IV sacral decubitus. ?Patient is brought here by a stretcher and with paramedical staff Colton Lamb is a 69 y.o. male with a history of coronary artery disease, ischemic cardiomyopathy, paroxysmal A. fib, defibrillator in place, obstructive sleep apnea, congestive heart failure, stage IV sacral decubitus, diverting colostomy who is a resident of [facility] is here for follow-up. Patient was recently in the hospital in February /March 2021 for a large sacral decubitus ulcer with progressive bone destruction of the distal sacrum and coccyx consistent with osteomyelitis.  He also had Pseudomonas bacteremia.  The stage IV sacral decubitus was debrided and the wound cultures  had VRE, Pseudomonas and patient r was discharged on Zosyn and linezolid to complete 4 weeks.  Patient returned because of failure to thrive was found to have thrombocytopenia and linezolid was stopped and Zosyn was changed to daptomycin, Cipro and Flagyl.  He completed all of those medications on 10/21/2019.  Patient is in [place] and is doing better now.    Patient has had a stage IV decubitus since November 2020. He had a diverting colostomy done on 07/03/2019 to prevent stool contamination and to promote wound healing.  In December he had completed 4 weeks of IV Zosyn.  Past Medical History:  Diagnosis Date  . Atrial flutter ([place])    a. s/p Cardioversion 11/22/13, on amiodarone and Xarelto.  . Chronic osteomyelitis ([place]) 06/30/2019   s/p colostomy  . Chronic systolic heart failure ([place])    a. 10/2013 EF 20-25%, grade III DD, RV mildly dilated and sys fx mild/mod reduced;  b. 01/2014 Echo: EF 30-35%, gr3 DD, mod dil LA. c. 07/2019 EF 20-25%  . Coronary artery disease    a. s/p MI 2007/2015;  b. s/p prior PCI to the LAD/LCX/PDA/PL;  c. 2008: s/p  Cypher DES to the OM.  Marland Kitchen Crohn's ileocolitis ([place])   . GERD (gastroesophageal reflux disease)   . Hx of adenomatous colonic polyps 11/2003  . Hyperlipidemia   . Hypertension   . Ischemic cardiomyopathy    s. 01/2014 s/p MDT [id number] [name] XT DR single lead AICD.  Marland Kitchen Obesity   . Paroxysmal atrial fibrillation ([facility])    a. CHA2DS2VASc = (CHF, HTN, agex1, DM)  . Sleep apnea   . Syncope    a.  11/2013 in setting of volume depletion and bradycardia due to dig toxicity   . Type II diabetes mellitus ([place])     Past Surgical History:  Procedure Laterality Date  . ATRIAL FLUTTER ABLATION N/A 04/16/2014   Procedure: ATRIAL FLUTTER ABLATION;  Surgeon: Evans Lance, MD;  Location: [facility] CATH LAB;  Service: Cardiovascular;  Laterality: N/A;  . CARDIAC CATHETERIZATION  10/2013  . CARDIAC DEFIBRILLATOR PLACEMENT  04/16/2014   Medtronic Evira device  . CARDIAC ELECTROPHYSIOLOGY STUDY AND ABLATION  04/16/2014   atrial flutter ablation  . CARDIOVERSION N/A 03/05/2014   Procedure: CARDIOVERSION;  Surgeon: Jolaine Artist, MD;  Location: [facility] ENDOSCOPY;  Service: Cardiovascular;  Laterality: N/A;  . CATARACT EXTRACTION W/PHACO Right 01/04/2017   Procedure: CATARACT EXTRACTION PHACO AND INTRAOCULAR LENS PLACEMENT ([place])  Right Diabetic Complicated;  Surgeon: Leandrew Koyanagi, MD;  Location: [facility];  Service: Ophthalmology;  Laterality: Right;  Diabetic  . CATARACT EXTRACTION W/PHACO Left 02/08/2017   Procedure: CATARACT EXTRACTION PHACO AND INTRAOCULAR LENS PLACEMENT ([facility]) left diabetic;  Surgeon: Leandrew Koyanagi, MD;  Location: [facility];  Service: Ophthalmology;  Laterality: Left;  Diabetic - oral meds sleep apnea  . CORONARY ANGIOPLASTY WITH STENT PLACEMENT  2007; 2008 X 2   "1+1 ~ 1"  . FOOT SURGERY Left    bone spur  . HYDROCELE EXCISION Bilateral   . 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 IMPLANTABLE CARDIOVERTER DEFIBRILLATOR IMPLANT N/A  04/16/2014   Procedure: IMPLANTABLE CARDIOVERTER DEFIBRILLATOR IMPLANT;  Surgeon: Evans Lance, MD;  Location: [facility] CATH LAB;  Service: Cardiovascular;  Laterality: N/A;  . LEFT HEART CATHETERIZATION WITH CORONARY ANGIOGRAM N/A 11/22/2013   Procedure: LEFT HEART CATHETERIZATION WITH CORONARY ANGIOGRAM;  Surgeon: Sinclair Grooms, MD;  Location: [facility] CATH LAB;  Service: Cardiovascular;  Laterality: N/A;  . TRANSVERSE LOOP COLOSTOMY N/A 07/03/2019   Procedure: TRANSVERSE LOOP COLOSTOMY;  Surgeon: Ronny Bacon, MD;  Location: [facility] ORS;  Service: General;  Laterality: N/A;    Social History   Socioeconomic History  . Marital status: Widowed    Spouse name: Not on file  . Number of children: 1  . Years of education: Not on file  . Highest education level: Not on file  Occupational History  . Occupation: retired    Fish farm manager: RETIRED  Tobacco Use  . Smoking status: Never Smoker  . Smokeless tobacco: Never Used  Substance and Sexual Activity  . Alcohol use: No  . Drug use: No  . Sexual activity: Never  Other Topics Concern  . Not on file  Social History Narrative  . Not on file   Social Determinants of Health   Financial Resource Strain: Low Risk   . Difficulty of Paying Living Expenses: Not hard at all  Food Insecurity: No Food Insecurity  . Worried About Charity fundraiser in the Last Year: Never true  . Ran Out of Food in the Last Year: Never true  Transportation Needs: No Transportation Needs  . Lack of Transportation (Medical): No  . Lack of Transportation (Non-Medical): No  Physical Activity: Inactive  . Days of Exercise per Week: 0 days  . Minutes of Exercise per Session: 0 min  Stress:   . Feeling of Stress :   Social Connections: Moderately Isolated  . Frequency of Communication with Friends and Family: Never  . Frequency of Social Gatherings with Friends and Family: Never  . Attends Religious Services: Never  . Active Member of Clubs or Organizations: Yes  .  Attends Archivist Meetings: 1 to 4 times per year  . Marital Status: Never married  Intimate Partner Violence: Unknown  . Fear of Current or Ex-Partner: No  . Emotionally Abused: Not on file  . Physically Abused: No  . Sexually Abused: No    Family History  Problem Relation Age of Onset  . Breast cancer Mother   . Heart disease Father   . Heart attack Father   . Colon cancer Neg Hx    Allergies  Allergen Reactions  . Iodine Other (See Comments)    Shortness of breath, swelling and hives  . Shrimp [Shellfish Allergy] Other (See Comments)    SWELLING    HIVES    SHORTNESS OF BREATH  . Tetracycline Rash   Current Outpatient Medications  Medication Sig Dispense Refill  . acetaminophen (TYLENOL) 325 MG tablet Take 1-2 tablets (325-650 mg total) by mouth every 4 (four) hours as needed for mild pain.    . Albuterol Sulfate 108 (90 Base) MCG/ACT  AEPB Inhale 1 puff into the lungs every 6 (six) hours as needed (shortness of breath).     Marland Kitchen atorvastatin (LIPITOR) 80 MG tablet TAKE 1 TABLET BY MOUTH EVERY DAY (Patient taking differently: Take 80 mg by mouth daily. ) 90 tablet 1  . balsalazide (COLAZAL) 750 MG capsule TAKE 1 CAPSULE (750 MG TOTAL) BY MOUTH 3 (THREE) TIMES DAILY. (Patient taking differently: Take 750 mg by mouth 3 (three) times daily. ) 270 capsule 3  . digoxin (LANOXIN) 0.125 MG tablet Take 1 tablet (0.125 mg total) by mouth daily.    . ferrous sulfate 325 (65 FE) MG EC tablet Take 325 mg by mouth daily with breakfast.    . finasteride (PROSCAR) 5 MG tablet Take 1 tablet (5 mg total) by mouth daily. 30 tablet 0  . HYDROcodone-acetaminophen (NORCO/VICODIN) 5-325 MG tablet Take 1-2 tablets by mouth every 4 (four) hours as needed for moderate pain. 15 tablet 0  . insulin aspart (NOVOLOG) 100 UNIT/ML injection Inject 5 Units into the skin 3 (three) times daily with meals. (Patient taking differently: Inject 0-12 Units into the skin 2 (two) times daily. Sliding Scale:   70-200= 0 units, 201-250= 2 units, 251-300= 4 units, 301-350= 6 units, 351-400=8 units, 401-450= 10 units, 451-500= 12 units.) 10 mL 11  . ipratropium-albuterol (DUONEB) 0.5-2.5 (3) MG/3ML SOLN Take 3 mLs by nebulization every 4 (four) hours as needed (shortness of breath or wheezing).    . metFORMIN (GLUCOPHAGE) 500 MG tablet Take 250 mg by mouth daily.     . midodrine (PROAMATINE) 10 MG tablet Take 1 tablet (10 mg total) by mouth 3 (three) times daily. 30 tablet 0  . Multiple Vitamins-Minerals (DECUBI-VITE PO) Take 1 capsule by mouth daily.     Marland Kitchen omeprazole (PRILOSEC) 20 MG capsule Take 20 mg by mouth daily.     . ondansetron (ZOFRAN-ODT) 4 MG disintegrating tablet Take 4 mg by mouth every 8 (eight) hours as needed for nausea or vomiting.     . saccharomyces boulardii (FLORASTOR) 250 MG capsule Take 250 mg by mouth 3 (three) times daily.    . tamsulosin (FLOMAX) 0.4 MG CAPS capsule Take 1 capsule (0.4 mg total) by mouth daily after breakfast. 30 capsule 0  . vitamin C (VITAMIN C) 500 MG tablet Take 1 tablet (500 mg total) by mouth 2 (two) times daily. (Patient taking differently: Take 1,000 mg by mouth 2 (two) times daily. ) 30 tablet 0  . XARELTO 20 MG TABS tablet TAKE 1 TABLET BY MOUTH EVERY DAY WITH LUNCH (Patient taking differently: Take 20 mg by mouth daily with lunch. ) 90 tablet 3   No current facility-administered medications for this visit.     Abtx:  Anti-infectives (From admission, onward)   None      REVIEW OF SYSTEMS:  Const: negative fever, negative chills, negative weight loss Eyes: negative diplopia or visual changes, negative eye pain ENT: negative coryza, negative sore throat Resp: negative cough, hemoptysis, has shortness of breath Cards: negative for chest pain, palpitations, lower extremity edema GU: negative for frequency, dysuria and hematuria GI: Negative for abdominal pain, diarrhea, bleeding, constipation Skin: negative for rash and pruritus Heme: negative  for easy bruising and gum/nose bleeding MS: Generalized weakness and is patient is bedbound Neurolo:negative for headaches, dizziness, vertigo, memory problems  Psych: negative for feelings of anxiety, depression  Allergy/Immunology-as above Objective:  VITALS:  BP 108/82   Pulse 73   Temp (!) 97.3 F (36.3 C) (Oral)  Resp 18   Ht 5' 11"  (1.803 m)   BMI 27.63 kg/m  PHYSICAL EXAM: Limited examination as patient is in the stretcher General: Alert, cooperative, no distress until he returned him in the stretcher and he was short of breath, .  Head: Normocephalic, without obvious abnormality, atraumatic. Eyes: Conjunctivae clear, anicteric sclerae. Pupils are equal ENT did not examine Neck: Supple,  Back: Sacral wound examined.  The wound is closing up very nicely It is smaller now  Today   10/21/19   09/21/19    Lungs: Bilateral air entry Heart: Irregular ICD site clean Abdomen: Soft, colostomy in place Foley catheter Extremities: atraumatic, no cyanosis. No edema. No clubbing Skin: Limited examination Neurologic: Did not examine in detail ? Impression/Recommendation ? ?Stage IV sacral decubitus with osteomyelitis secondary to immobility. He has had this for the last 6 to 8 months. The wound looks the best it has ever been. With diverting colostomy and recent IV antibiotics the wound is closing. He needs to make appointment with wound care clinic and follow-up with them. He does not need any antibiotics now. He definitely will have to get a air mattress in the nursing home He will have to be turned. He will have to be getting physical therapy .  All these are going to help him heal. He has a Foley catheter and the urine is very cloudy.  He has to increase his water intake.  No antibiotics needed.  CAD, A. fib, AICD  Discussed the management with the patient and gave him after visit summary to take to the nursing  home. ? ___________________________________________________ Note:  This document was prepared using Dragon voice recognition software and may include unintentional dictation errors.

## 2019-11-27 NOTE — Telephone Encounter (Signed)
Attempted to schedule. Lmov

## 2019-11-29 NOTE — ED Provider Notes (Signed)
[facility] EMERGENCY DEPARTMENT Provider Note   CSN: [id number] Arrival date & time: 10/31/19  7096     History No chief complaint on file.   Colton Lamb is a 69 y.o. male.  HPI   45yM with CP. Has been having similar pain intermittently for weeks. Came back again last night. Feels tight. Reports his defibrillator discharged x2 today. Noted to be in afib with RVR. Hx of the same. He thinks he has been getting all his medications as prescribed. No unusual leg pain or swelling.    Past Medical History:  Diagnosis Date  . Atrial flutter ([place])    a. s/p Cardioversion 11/22/13, on amiodarone and Xarelto.  . Chronic osteomyelitis ([place]) 06/30/2019   s/p colostomy  . Chronic systolic heart failure ([place])    a. 10/2013 EF 20-25%, grade III DD, RV mildly dilated and sys fx mild/mod reduced;  b. 01/2014 Echo: EF 30-35%, gr3 DD, mod dil LA. c. 07/2019 EF 20-25%  . Coronary artery disease    a. s/p MI 2007/2015;  b. s/p prior PCI to the LAD/LCX/PDA/PL;  c. 2008: s/p Cypher DES to the OM.  Marland Kitchen Crohn's ileocolitis ([place])   . GERD (gastroesophageal reflux disease)   . Hx of adenomatous colonic polyps 11/2003  . Hyperlipidemia   . Hypertension   . Ischemic cardiomyopathy    s. 01/2014 s/p MDT [id number] [name] XT DR single lead AICD.  Marland Kitchen Obesity   . Paroxysmal atrial fibrillation (HCC)    a. CHA2DS2VASc = (CHF, HTN, agex1, DM)  . Sleep apnea   . Syncope    a.  11/2013 in setting of volume depletion and bradycardia due to dig toxicity   . Type II diabetes mellitus [facility])     Patient Active Problem List   Diagnosis Date Noted  . Chest pain 10/26/2019  . Diarrhea 10/11/2019  . COVID-19 virus infection 10/11/2019  . Thrombocytopenia ([place]) 10/11/2019  . Sacral decubitus ulcer, stage IV ([place]) 10/11/2019  . Encounter for competency evaluation   . Acute respiratory distress syndrome (ARDS) due to COVID-19 virus ([place]) 09/29/2019  . Bacteremia due to Pseudomonas 09/29/2019  . Goals  of care, counseling/discussion   . Palliative care by specialist   . HCAP (healthcare-associated pneumonia) 09/23/2019  . Colostomy in place [facility]) 09/23/2019  . Bleeding 07/11/2019  . Chronic osteomyelitis ([place]) 06/30/2019  . PICC (peripherally inserted central catheter) flush   . Normocytic anemia 06/21/2019  . Decubitus ulcer of sacral region, unstageable ([place]) 06/19/2019  . Hypotension 06/19/2019  . Anemia   . Acute respiratory failure ([place]) 06/12/2019  . Chronic systolic CHF (congestive heart failure) ([place])   . Acute blood loss anemia   . Pressure injury of skin 03/17/2019  . Acute renal failure (ARF) ([place])   . Empyema lung ([place])   . Shortness of breath   . Pleural effusion on right   . Sepsis ([place]) 03/04/2019  . Restless legs syndrome (RLS) 06/16/2017  . Hypertension, essential 06/16/2017  . CAD in native artery 04/27/2017  . Hydrocephalus ([place]) 09/08/2016  . Dizziness and giddiness   . Elevated troponin 09/06/2016  . Type II diabetes mellitus ([place])   . Ischemic cardiomyopathy   . Hyponatremia 07/01/2015  . Diabetes mellitus type 2, uncontrolled, with complications ([place]) 28/36/6294  . 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 OSA (obstructive sleep apnea) 12/20/2013  . Morbid obesity ([place]) 12/20/2013  . AKI (acute kidney injury) - Creatinine improved at d/c 12/14/2013  . Elevated  TSH - will need f/u TFTs with PCP in 3-4 weeks 12/14/2013  . Junctional bradycardia - resolved 12/14/2013  . Syncope - due to bradycardia in setting of Digoxin Toxicity 12/11/2013  . Acute on chronic HFrEF (heart failure with reduced ejection fraction) ([place]) 12/06/2013  . At risk for sudden cardiac death - on LifeVest 2013/12/07  . Acute on chronic combined systolic and diastolic CHF (congestive heart failure) ([place]) 12-07-2013  . Cardiomyopathy, ischemic 07-Dec-2013  . Atrial flutter ([place]) 11/22/2013  . NSTEMI (non-ST elevated myocardial infarction) ([place]) 11/22/2013  .  Long term current use of anticoagulant 10/19/2010  . Hyperlipidemia 05/06/2010  . Edema 05/06/2010  . CAD S/P percutaneous coronary angioplasty - multiple PCIs 11/11/2008  . Atrial fibrillation ([place]) 11/11/2008  . GERD 11/11/2008  . [place] INTESTINE 11/11/2008  . COLONIC POLYPS, HX OF 11/11/2008  . [place] ALLERGY 11/11/2008    Past Surgical History:  Procedure Laterality Date  . ATRIAL FLUTTER ABLATION N/A 04/16/2014   Procedure: ATRIAL FLUTTER ABLATION;  Surgeon: Evans Lance, MD;  Location: [facility] CATH LAB;  Service: Cardiovascular;  Laterality: N/A;  . CARDIAC CATHETERIZATION  10/2013  . CARDIAC DEFIBRILLATOR PLACEMENT  04/16/2014   Medtronic Evira device  . CARDIAC ELECTROPHYSIOLOGY STUDY AND ABLATION  04/16/2014   atrial flutter ablation  . CARDIOVERSION N/A 03/05/2014   Procedure: CARDIOVERSION;  Surgeon: Jolaine Artist, MD;  Location: [facility] ENDOSCOPY;  Service: Cardiovascular;  Laterality: N/A;  . CATARACT EXTRACTION W/PHACO Right 01/04/2017   Procedure: CATARACT EXTRACTION PHACO AND INTRAOCULAR LENS PLACEMENT ([place])  Right Diabetic Complicated;  Surgeon: Leandrew Koyanagi, MD;  Location: [place];  Service: Ophthalmology;  Laterality: Right;  Diabetic  . CATARACT EXTRACTION W/PHACO Left 02/08/2017   Procedure: CATARACT EXTRACTION PHACO AND INTRAOCULAR LENS PLACEMENT ([place]) left diabetic;  Surgeon: Leandrew Koyanagi, MD;  Location: [place];  Service: Ophthalmology;  Laterality: Left;  Diabetic - oral meds sleep apnea  . CORONARY ANGIOPLASTY WITH STENT PLACEMENT  2007; 2008 X 2   "1+1 ~ 1"  . FOOT SURGERY Left    bone spur  . HYDROCELE EXCISION Bilateral   . 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 IMPLANTABLE CARDIOVERTER DEFIBRILLATOR IMPLANT N/A 04/16/2014   Procedure: IMPLANTABLE CARDIOVERTER DEFIBRILLATOR IMPLANT;  Surgeon: Evans Lance, MD;  Location: [facility] CATH LAB;  Service: Cardiovascular;   Laterality: N/A;  . LEFT HEART CATHETERIZATION WITH CORONARY ANGIOGRAM N/A 11/22/2013   Procedure: LEFT HEART CATHETERIZATION WITH CORONARY ANGIOGRAM;  Surgeon: Sinclair Grooms, MD;  Location[facility] C[facility] LAB;  Service: Cardiovascular;  Laterality: N/A;  . TRANSVERSE LOOP COLOSTOMY N/A 07/03/2019   Procedure: TRANSVERSE LOOP COLOSTOMY;  Surgeon: Ronny Bacon, MD;  Location: [facility] ORS;  Service: General;  Laterality: N/A;       Family History  Problem Relation Age of Onset  . Breast cancer Mother   . Heart disease Father   . Heart attack Father   . Colon cancer Neg Hx     Social History   Tobacco Use  . Smoking status: Never Smoker  . Smokeless tobacco: Never Used  Substance Use Topics  . Alcohol use: No  . Drug use: No    Home Medications Prior to Admission medications   Medication Sig Start Date End Date Taking? Authorizing Provider  acetaminophen (TYLENOL) 325 MG tablet Take 1-2 tablets (325-650 mg total) by mouth every 4 (four) hours as needed for mild pain. 04/17/14  Yes [name], NP  Albuterol  Sulfate 108 (90 Base) MCG/ACT AEPB Inhale 1 puff into the lungs every 6 (six) hours as needed (shortness of breath).    Yes [provider]  atorvastatin (LIPITOR) 80 MG tablet TAKE 1 TABLET BY MOUTH EVERY DAY Patient taking differently: Take 80 mg by mouth daily.  03/08/19  Yes Gollan, Kathlene November, MD  balsalazide (COLAZAL) 750 MG capsule TAKE 1 CAPSULE (750 MG TOTAL) BY MOUTH 3 (THREE) TIMES DAILY. Patient taking differently: Take 750 mg by mouth 3 (three) times daily.  11/28/18  Yes Ladene Artist, MD  ferrous sulfate 325 (65 FE) MG EC tablet Take 325 mg by mouth daily with breakfast.   Yes [provider]  finasteride (PROSCAR) 5 MG tablet Take 1 tablet (5 mg total) by mouth daily. 03/20/19  Yes Wieting, Richard, MD  HYDROcodone-acetaminophen (NORCO/VICODIN) 5-325 MG tablet Take 1-2 tablets by mouth every 4 (four) hours as needed for moderate pain. 10/27/19  Yes  Adhikari, Tamsen Meek, MD  insulin aspart (NOVOLOG) 100 UNIT/ML injection Inject 5 Units into the skin 3 (three) times daily with meals. Patient taking differently: Inject 0-12 Units into the skin 2 (two) times daily. Sliding Scale:  70-200= 0 units, 201-250= 2 units, 251-300= 4 units, 301-350= 6 units, 351-400=8 units, 401-450= 10 units, 451-500= 12 units. 03/19/19  Yes Wieting, Richard, MD  ipratropium-albuterol (DUONEB) 0.5-2.5 (3) MG/3ML SOLN Take 3 mLs by nebulization every 4 (four) hours as needed (shortness of breath or wheezing).   Yes [provider]  metFORMIN (GLUCOPHAGE) 500 MG tablet Take 250 mg by mouth daily.  10/29/19  Yes [provider]  midodrine (PROAMATINE) 10 MG tablet Take 1 tablet (10 mg total) by mouth 3 (three) times daily. 04/26/19  Yes Max Sane, MD  Multiple Vitamins-Minerals (DECUBI-VITE PO) Take 1 capsule by mouth daily.    Yes [provider]  omeprazole (PRILOSEC) 20 MG capsule Take 20 mg by mouth daily.    Yes [provider]  ondansetron (ZOFRAN-ODT) 4 MG disintegrating tablet Take 4 mg by mouth every 8 (eight) hours as needed for nausea or vomiting.  10/27/19  Yes [provider]  saccharomyces boulardii (FLORASTOR) 250 MG capsule Take 250 mg by mouth 3 (three) times daily.   Yes [provider]  tamsulosin (FLOMAX) 0.4 MG CAPS capsule Take 1 capsule (0.4 mg total) by mouth daily after breakfast. 03/20/19  Yes Wieting, Richard, MD  vitamin C (VITAMIN C) 500 MG tablet Take 1 tablet (500 mg total) by mouth 2 (two) times daily. Patient taking differently: Take 1,000 mg by mouth 2 (two) times daily.  07/09/19  Yes [name], MD  XARELTO 20 MG TABS tablet TAKE 1 TABLET BY MOUTH EVERY DAY WITH LUNCH Patient taking differently: Take 20 mg by mouth daily with lunch.  05/17/19  Yes Gollan, Kathlene November, MD  digoxin (LANOXIN) 0.125 MG tablet Take 1 tablet (0.125 mg total) by mouth daily. 11/01/19   Mesner, Corene Cornea, MD  ferrous gluconate  (FERGON) 324 MG tablet Take 1 tablet (324 mg total) by mouth daily with breakfast. Patient not taking: Reported on 10/31/2019 10/24/19 11/01/19  Wyvonnia Dusky, MD  insulin glargine (LANTUS) 100 UNIT/ML injection Inject 5 Units into the skin daily.  11/01/19  [provider]    Allergies    Iodine, Shrimp [shellfish allergy], and Tetracycline  Review of Systems   Review of Systems All systems reviewed and negative, other than as noted in HPI.  Physical Exam Updated Vital Signs BP 117/70  Pulse (!) 103   Temp 97.9 F (36.6 C) (Oral)   Resp 14   Ht 5' 11"  (1.803 m)   Wt 113.4 kg   SpO2 95%   BMI 34.87 kg/m   Physical Exam Vitals and nursing note reviewed.  Constitutional:      General: He is not in acute distress.    Appearance: He is well-developed.  HENT:     Head: Normocephalic and atraumatic.  Eyes:     General:        Right eye: No discharge.        Left eye: No discharge.     Conjunctiva/sclera: Conjunctivae normal.  Cardiovascular:     Rate and Rhythm: Normal rate and regular rhythm.     Heart sounds: Normal heart sounds. No murmur. No friction rub. No gallop.   Pulmonary:     Effort: Pulmonary effort is normal. No respiratory distress.     Breath sounds: Normal breath sounds.  Abdominal:     General: There is no distension.     Palpations: Abdomen is soft.     Tenderness: There is no abdominal tenderness.  Musculoskeletal:        General: No tenderness.     Cervical back: Neck supple.  Skin:    General: Skin is warm and dry.  Neurological:     Mental Status: He is alert.  Psychiatric:        Behavior: Behavior normal.        Thought Content: Thought content normal.     ED Results / Procedures / Treatments   Labs (all labs ordered are listed, but only abnormal results are displayed) Labs Reviewed  URINE CULTURE - Abnormal; Notable for the following components:      Result Value   Culture >=100,000 COLONIES/mL YEAST (*)    All other  components within normal limits  CBC WITH DIFFERENTIAL/PLATELET - Abnormal; Notable for the following components:   WBC 14.0 (*)    RBC 2.96 (*)    Hemoglobin 8.3 (*)    HCT 26.3 (*)    RDW 18.4 (*)    Platelets 470 (*)    Neutro Abs 10.1 (*)    Eosinophils Absolute 0.9 (*)    All other components within normal limits  BASIC METABOLIC PANEL - Abnormal; Notable for the following components:   Potassium 3.2 (*)    CO2 21 (*)    Glucose, Bld 103 (*)    Calcium 8.2 (*)    All other components within normal limits  URINALYSIS, ROUTINE W REFLEX MICROSCOPIC - Abnormal; Notable for the following components:   APPearance CLOUDY (*)    Protein, ur 100 (*)    Leukocytes,Ua LARGE (*)    WBC, UA >50 (*)    All other components within normal limits  DIGOXIN LEVEL - Abnormal; Notable for the following components:   Digoxin Level <0.2 (*)    All other components within normal limits  TROPONIN I (HIGH SENSITIVITY) - Abnormal; Notable for the following components:   Troponin I (High Sensitivity) 22 (*)    All other components within normal limits  TROPONIN I (HIGH SENSITIVITY) - Abnormal; Notable for the following components:   Troponin I (High Sensitivity) 25 (*)    All other components within normal limits    EKG EKG Interpretation  Date/Time:  Friday November 01 2019 02:10:57 EDT Ventricular Rate:  141 PR Interval:    QRS Duration: 82 QT Interval:  330 QTC Calculation: 505 R Axis:   -  20 Text Interpretation: Atrial fibrillation Left anterior fascicular block Anterolateral infarct , age undetermined Abnormal ECG No significant change since yesterday Confirmed by Aletta Edouard [phone]) on 11/10/2019 6:31:29 PM   Radiology No results found.  Procedures Procedures (including critical care time)  Medications Ordered in ED Medications  diltiazem (CARDIZEM) injection 15 mg (15 mg Intravenous Given 10/31/19 2337)  lactated ringers bolus 1,000 mL (0 mLs Intravenous Stopped 11/01/19 0500)    digoxin (LANOXIN) tablet 0.125 mg (0.125 mg Oral Given 11/01/19 0340)    ED Course  I have reviewed the triage vital signs and the nursing notes.  Pertinent labs & imaging results that were available during my care of the patient were reviewed by me and considered in my medical decision making (see chart for details).    MDM Rules/Calculators/A&P                      1yM with CP. Seems atypical for ACS. Troponin minimally elevated but flat. Afib with RVR but has hx of the same. Dose of diltiazem given. No dyspnea. Device interrogated and no shocks given. FU with cards.   Final Clinical Impression(s) / ED Diagnoses Final diagnoses:  Chest pain, unspecified type  Atrial fibrillation, unspecified type [facility])    Rx / DC Orders ED Discharge Orders         Ordered    digoxin (LANOXIN) 0.125 MG tablet  Daily     11/01/19 0456           Virgel Manifold, MD 11/29/19 1640

## 2019-12-02 NOTE — Telephone Encounter (Signed)
Mailed recall .  Closing encounter.

## 2019-12-04 ENCOUNTER — Other Ambulatory Visit: Payer: Self-pay

## 2019-12-04 ENCOUNTER — Encounter: Payer: Medicare Other | Attending: Internal Medicine | Admitting: Internal Medicine

## 2019-12-04 DIAGNOSIS — Z933 Colostomy status: Secondary | ICD-10-CM | POA: Diagnosis not present

## 2019-12-04 DIAGNOSIS — I5022 Chronic systolic (congestive) heart failure: Secondary | ICD-10-CM | POA: Diagnosis not present

## 2019-12-04 DIAGNOSIS — I2510 Atherosclerotic heart disease of native coronary artery without angina pectoris: Secondary | ICD-10-CM | POA: Diagnosis not present

## 2019-12-04 DIAGNOSIS — Z7982 Long term (current) use of aspirin: Secondary | ICD-10-CM | POA: Diagnosis not present

## 2019-12-04 DIAGNOSIS — M8668 Other chronic osteomyelitis, other site: Secondary | ICD-10-CM | POA: Diagnosis not present

## 2019-12-04 DIAGNOSIS — E1169 Type 2 diabetes mellitus with other specified complication: Secondary | ICD-10-CM | POA: Insufficient documentation

## 2019-12-04 DIAGNOSIS — L89312 Pressure ulcer of right buttock, stage 2: Secondary | ICD-10-CM | POA: Diagnosis not present

## 2019-12-04 DIAGNOSIS — I480 Paroxysmal atrial fibrillation: Secondary | ICD-10-CM | POA: Insufficient documentation

## 2019-12-04 DIAGNOSIS — Z1619 Resistance to other specified beta lactam antibiotics: Secondary | ICD-10-CM | POA: Insufficient documentation

## 2019-12-04 DIAGNOSIS — I130 Hypertensive heart and chronic kidney disease with heart failure and stage 1 through stage 4 chronic kidney disease, or unspecified chronic kidney disease: Secondary | ICD-10-CM | POA: Insufficient documentation

## 2019-12-04 DIAGNOSIS — Z7901 Long term (current) use of anticoagulants: Secondary | ICD-10-CM | POA: Diagnosis not present

## 2019-12-04 DIAGNOSIS — B9689 Other specified bacterial agents as the cause of diseases classified elsewhere: Secondary | ICD-10-CM | POA: Insufficient documentation

## 2019-12-04 DIAGNOSIS — L89153 Pressure ulcer of sacral region, stage 3: Secondary | ICD-10-CM | POA: Diagnosis present

## 2019-12-04 DIAGNOSIS — E1122 Type 2 diabetes mellitus with diabetic chronic kidney disease: Secondary | ICD-10-CM | POA: Insufficient documentation

## 2019-12-04 DIAGNOSIS — K5090 Crohn's disease, unspecified, without complications: Secondary | ICD-10-CM | POA: Insufficient documentation

## 2019-12-04 DIAGNOSIS — Z95810 Presence of automatic (implantable) cardiac defibrillator: Secondary | ICD-10-CM | POA: Diagnosis not present

## 2019-12-04 DIAGNOSIS — N189 Chronic kidney disease, unspecified: Secondary | ICD-10-CM | POA: Insufficient documentation

## 2019-12-04 NOTE — Progress Notes (Signed)
Colton Lamb, Colton Lamb ([id number]) Visit Report for 12/04/2019 Abuse/Suicide Risk Screen Details Patient Name: Colton Lamb, Colton Lamb. Date of Service: 12/04/2019 1:00 PM Medical Record Number: [id number] Patient Account Number: [id number] Date of Birth/Sex: 1951-01-19 (68 y.o. M) Treating RN: Army Melia Primary Care Keshana Klemz: [name] Other Clinician: Referring Kimesha Claxton: Treating Kaislee Chao/Extender: Tito Dine in Treatment: 0 Abuse/Suicide Risk Screen Items Answer ABUSE RISK SCREEN: Has anyone close to you tried to hurt or harm you recentlyo No Do you feel uncomfortable with anyone in your familyo No Has anyone forced you do things that you didnot want to doo No Electronic Signature(s) Signed: 12/04/2019 2:57:49 PM By: Army Melia Entered By: Army Melia on 12/04/2019 13:43:36 Colton Lamb ([id number]) -------------------------------------------------------------------------------- Activities of Daily Living Details Patient Name: Colton Lamb, Colton Lamb. Date of Service: 12/04/2019 1:00 PM Medical Record Number: [id number] Patient Account Number: [id number] Date of Birth/Sex: 1950/09/15 (68 y.o. M) Treating RN: Army Melia Primary Care Loza Prell: [name] Other Clinician: Referring Zhi Geier: Treating Ashtan Laton/Extender: Tito Dine in Treatment: 0 Activities of Daily Living Items Answer Activities of Daily Living (Please select one for each item) Drive Automobile Not Able Take Medications Completely Able Use Telephone Completely Able Care for Appearance Need Assistance Use Toilet Need Assistance Bath / Shower Need Assistance Dress Self Need Assistance Feed Self Need Assistance Walk Need Assistance Get In / Out Bed Need Assistance Housework Need Assistance Prepare Meals Need Assistance Handle Money Need Assistance Shop for Self Need Assistance Electronic Signature(s) Signed: 12/04/2019 2:57:49 PM By: Army Melia Entered By: Army Melia on  12/04/2019 13:43:54 Colton Lamb ([id number]) -------------------------------------------------------------------------------- Education Screening Details Patient Name: Colton Lamb Date of Service: 12/04/2019 1:00 PM Medical Record Number: [id number] Patient Account Number: [id number] Date of Birth/Sex: 12/13/1950 (68 y.o. M) Treating RN: Army Melia Primary Care Briley Bumgarner: [name] Other Clinician: Referring Shakerra Red: Treating Perel Hauschild/Extender: Tito Dine in Treatment: 0 Primary Learner Assessed: Patient Learning Preferences/Education Level/Primary Language Learning Preference: Explanation Highest Education Level: High School Preferred Language: English Cognitive Barrier Language Barrier: No Translator Needed: No Memory Deficit: No Emotional Barrier: No Cultural/Religious Beliefs Affecting Medical Care: No Physical Barrier Impaired Vision: No Impaired Hearing: No Decreased Hand dexterity: No Knowledge/Comprehension Knowledge Level: High Comprehension Level: High Ability to understand written instructions: High Ability to understand verbal instructions: High Motivation Anxiety Level: Calm Cooperation: Cooperative Education Importance: Acknowledges Need Interest in Health Problems: Asks Questions Perception: Coherent Willingness to Engage in Self-Management High Activities: Readiness to Engage in Self-Management High Activities: Electronic Signature(s) Signed: 12/04/2019 2:57:49 PM By: Army Melia Entered By: Army Melia on 12/04/2019 13:44:11 Colton Lamb ([id number]) -------------------------------------------------------------------------------- Fall Risk Assessment Details Patient Name: Colton Lamb. Date of Service: 12/04/2019 1:00 PM Medical Record Number: [id number] Patient Account Number: [id number] Date of Birth/Sex: October 26, 1950 (68 y.o. M) Treating RN: Army Melia Primary Care Quashawn Jewkes: [name] Other  Clinician: Referring Gerren Hoffmeier: Treating Gayna Braddy/Extender: Tito Dine in Treatment: 0 Fall Risk Assessment Items Have you had 2 or more falls in the last 12 monthso 0 No Have you had any fall that resulted in injury in the last 12 monthso 0 No FALLS RISK SCREEN History of falling - immediate or within 3 months 0 No Secondary diagnosis (Do you have 2 or more medical diagnoseso) 0 No Ambulatory aid None/bed rest/wheelchair/nurse 0 No Crutches/cane/walker 0 No Furniture 0 No Intravenous therapy Access/Saline/Heparin Lock 0 No Gait/Transferring Normal/ bed rest/ wheelchair 0 No Weak (short steps with or without shuffle, stooped but able to lift  head while walking, may 0 No seek support from furniture) Impaired (short steps with shuffle, may have difficulty arising from chair, head down, impaired 0 No balance) Mental Status Oriented to own ability 0 No Electronic Signature(s) Signed: 12/04/2019 2:57:49 PM By: Army Melia Entered By: Army Melia on 12/04/2019 13:44:15 Colton Lamb ([id number]) -------------------------------------------------------------------------------- Nutrition Risk Screening Details Patient Name: Colton Lamb, Colton Lamb. Date of Service: 12/04/2019 1:00 PM Medical Record Number: [id number] Patient Account Number: [id number] Date of Birth/Sex: 04/06/1951 (68 y.o. M) Treating RN: Army Melia Primary Care Klayton Monie: [name] Other Clinician: Referring Lanie Schelling: Treating Jayse Hodkinson/Extender: Tito Dine in Treatment: 0 Height (in): Weight (lbs): Body Mass Index (BMI): Nutrition Risk Screening Items Score Screening NUTRITION RISK SCREEN: I have an illness or condition that made me change the kind and/or amount of food I eat 0 No I eat fewer than two meals per day 0 No I eat few fruits and vegetables, or milk products 0 No I have three or more drinks of beer, liquor or wine almost every day 0 No I have tooth or mouth problems that  make it hard for me to eat 0 No I don't always have enough money to buy the food I need 0 No I eat alone most of the time 0 No I take three or more different prescribed or over-the-counter drugs a day 0 No Without wanting to, I have lost or gained 10 pounds in the last six months 0 No I am not always physically able to shop, cook and/or feed myself 0 No Nutrition Protocols Good Risk Protocol 0 No interventions needed Moderate Risk Protocol High Risk Proctocol Risk Level: Good Risk Score: 0 Electronic Signature(s) Signed: 12/04/2019 2:57:49 PM By: Army Melia Entered By: Army Melia on 12/04/2019 13:44:22

## 2019-12-05 NOTE — Progress Notes (Signed)
GINO, GARRABRANT ([id number]) Visit Report for 12/04/2019 Allergy List Details Patient Name: Colton Lamb, Colton Lamb. Date of Service: 12/04/2019 1:00 PM Medical Record Number: [id number] Patient Account Number: [id number] Date of Birth/Sex: 04-05-1951 (69 y.o. M) Treating RN: Army Melia Primary Care Mirranda Monrroy: [name] Other Clinician: Referring Mykaela Arena: Treating Maxxwell Edgett/Extender: Ricard Dillon Weeks in Treatment: 0 Allergies Active Allergies Shellfish Containing Products shrimp tetracycline HCl Allergy Notes Electronic Signature(s) Signed: 12/04/2019 2:57:49 PM By: Army Melia Entered By: Army Melia on 12/04/2019 13:34:57 [name] ([id number]) -------------------------------------------------------------------------------- Arrival Information Details Patient Name: Colton Lamb, Colton Lamb. Date of Service: 12/04/2019 1:00 PM Medical Record Number: [id number] Patient Account Number: [id number] Date of Birth/Sex: 1951-01-30 (69 y.o. M) Treating RN: Cornell Barman Primary Care Javoni Lucken: Lovie Macadamia, DAVID Other Clinician: Referring Bianca Vester: Treating My Rinke/Extender: Tito Dine in Treatment: 0 Visit Information Patient Arrived: Stretcher Arrival Time: 13:28 Accompanied By: EMS Transfer Assistance: Manual Patient Identification Verified: Yes Secondary Verification Process Completed: Yes History Since Last Visit Added or deleted any medications: No Any new allergies or adverse reactions: No Had a fall or experienced change in activities of daily living that may affect risk of falls: No Signs or symptoms of abuse/neglect since last visito No Hospitalized since last visit: No Implantable device outside of the clinic excluding cellular tissue based products placed in the center since last visit: No Electronic Signature(s) Signed: 12/04/2019 4:20:30 PM By: Lorine Bears RCP, RRT, CHT Entered By: Lorine Bears on 12/04/2019  13:29:22 [name] ([id number]) -------------------------------------------------------------------------------- Clinic Level of Care Assessment Details Patient Name: Colton Lamb, Colton Lamb. Date of Service: 12/04/2019 1:00 PM Medical Record Number: [id number] Patient Account Number: [id number] Date of Birth/Sex: June 28, 1951 (69 y.o. M) Treating RN: Cornell Barman Primary Care Baldo Hufnagle: [name] Other Clinician: Referring Estus Krakowski: Treating Kaidin Boehle/Extender: Tito Dine in Treatment: 0 Clinic Level of Care Assessment Items TOOL 1 Quantity Score []  - Use when EandM and Procedure is performed on INITIAL visit 0 ASSESSMENTS - Nursing Assessment / Reassessment X - General Physical Exam (combine w/ comprehensive assessment (listed just below) when performed on new 1 20 pt. evals) X- 1 25 Comprehensive Assessment (HX, ROS, Risk Assessments, Wounds Hx, etc.) ASSESSMENTS - Wound and Skin Assessment / Reassessment []  - Dermatologic / Skin Assessment (not related to wound area) 0 ASSESSMENTS - Ostomy and/or Continence Assessment and Care []  - Incontinence Assessment and Management 0 []  - 0 Ostomy Care Assessment and Management (repouching, etc.) PROCESS - Coordination of Care X - Simple Patient / Family Education for ongoing care 1 15 []  - 0 Complex (extensive) Patient / Family Education for ongoing care X- 1 10 Staff obtains Programmer, systems, Records, Test Results / Process Orders []  - 0 Staff telephones HHA, Nursing Homes / Clarify orders / etc []  - 0 Routine Transfer to another Facility (non-emergent condition) []  - 0 Routine Hospital Admission (non-emergent condition) X- 1 15 New Admissions / Biomedical engineer / Ordering NPWT, Apligraf, etc. []  - 0 Emergency Hospital Admission (emergent condition) PROCESS - Special Needs []  - Pediatric / Minor Patient Management 0 []  - 0 Isolation Patient Management []  - 0 Hearing / Language / Visual special needs []  -  0 Assessment of Community assistance (transportation, D/C planning, etc.) []  - 0 Additional assistance / Altered mentation []  - 0 Support Surface(s) Assessment (bed, cushion, seat, etc.) INTERVENTIONS - Miscellaneous []  - External ear exam 0 X- 1 10 Patient Transfer (multiple staff / Civil Service fast streamer / Similar devices) []  - 0 Simple Staple / Suture  removal (25 or less) []  - 0 Complex Staple / Suture removal (26 or more) []  - 0 Hypo/Hyperglycemic Management (do not check if billed separately) []  - 0 Ankle / Brachial Index (ABI) - do not check if billed separately Has the patient been seen at the hospital within the last three years: Yes Total Score: 95 Level Of Care: New/Established - Level 3 Colton Lamb, Colton Lamb ([id number]) Electronic Signature(s) Signed: 12/04/2019 5:22:43 PM By: Gretta Cool, BSN, RN, CWS, Kim RN, BSN Entered By: Gretta Cool, BSN, RN, CWS, Kim on 12/04/2019 13:59:57 [name] ([id number]) -------------------------------------------------------------------------------- Encounter Discharge Information Details Patient Name: Colton Lamb, Colton Lamb. Date of Service: 12/04/2019 1:00 PM Medical Record Number: [id number] Patient Account Number: [id number] Date of Birth/Sex: Mar 13, 1951 (69 y.o. M) Treating RN: Cornell Barman Primary Care Chinenye Katzenberger: [name] Other Clinician: Referring Shaden Lacher: Treating Lashena Signer/Extender: Tito Dine in Treatment: 0 Encounter Discharge Information Items Post Procedure Vitals Discharge Condition: Stable Temperature (F): 98.8 Ambulatory Status: Stretcher Pulse (bpm): 88 Discharge Destination: [place] Respiratory Rate (breaths/min): 18 Telephoned: No Blood Pressure (mmHg): 137/87 Orders Sent: Yes Transportation: Ambulance Accompanied By: EMT Schedule Follow-up Appointment: No Clinical Summary of Care: Electronic Signature(s) Signed: 12/04/2019 5:22:43 PM By: Gretta Cool, BSN, RN, CWS, Kim RN, BSN Entered By: Gretta Cool, BSN,  RN, CWS, Kim on 12/04/2019 14:02:15 [name] ([id number]) -------------------------------------------------------------------------------- Lower Extremity Assessment Details Patient Name: Colton Lamb, Colton Lamb. Date of Service: 12/04/2019 1:00 PM Medical Record Number: [id number] Patient Account Number: [id number] Date of Birth/Sex: 07/01/1951 (68 y.o. M) Treating RN: Army Melia Primary Care Brynlee Pennywell: [name] Other Clinician: Referring Rafaelita Foister: Treating Burle Kwan/Extender: Tito Dine in Treatment: 0 Electronic Signature(s) Signed: 12/04/2019 2:57:49 PM By: Army Melia Entered By: Army Melia on 12/04/2019 13:43:28 [name] ([id number]) -------------------------------------------------------------------------------- Multi Wound Chart Details Patient Name: Colton Lamb, Colton Lamb. Date of Service: 12/04/2019 1:00 PM Medical Record Number: [id number] Patient Account Number: [id number] Date of Birth/Sex: [birth year] (68 y.o. M) Treating RN: Cornell Barman Primary Care Yolander Goodie: Lovie Macadamia, DAVID Other Clinician: Referring Adelita Hone: Treating Bryne Lindon/Extender: Tito Dine in Treatment: 0 Vital Signs Height(in): Pulse(bpm): 80 Weight(lbs): Blood Pressure(mmHg): 137/87 Body Mass Index(BMI): Temperature(F): 98.8 Respiratory Rate(breaths/min): 16 Photos: [N/A:N/A] Wound Location: Midline Sacrum Right Gluteus N/A Wounding Event: Pressure Injury Pressure Injury N/A Primary Etiology: Pressure Ulcer Pressure Ulcer N/A Comorbid History: Arrhythmia, Congestive Heart Arrhythmia, Congestive Heart N/A Failure, Coronary Artery Disease, Failure, Coronary Artery Disease, Hypertension, Myocardial Infarction, Hypertension, Myocardial Infarction, Crohnos, Type II Diabetes, History Crohnos, Type II Diabetes, History of pressure wounds of pressure wounds Date Acquired: 06/02/2019 06/02/2019 N/A Weeks of Treatment: 0 0 N/A Wound Status: Open Open N/A Measurements L  x W x D (cm) 4x8x0.8 1x0.8x0.1 N/A Area (cm) : 25.133 0.628 N/A Volume (cm) : 20.106 0.063 N/A Classification: Category/Stage III Category/Stage II N/A Exudate Amount: Medium Medium N/A Exudate Type: Serosanguineous Serosanguineous N/A Exudate Color: red, brown red, brown N/A Wound Margin: Flat and Intact Flat and Intact N/A Granulation Amount: Medium (34-66%) Medium (34-66%) N/A Granulation Quality: Red Red N/A Necrotic Amount: Medium (34-66%) Medium (34-66%) N/A Exposed Structures: Fat Layer (Subcutaneous Tissue) Fat Layer (Subcutaneous Tissue) N/A Exposed: Yes Exposed: Yes Fascia: No Fascia: No Tendon: No Tendon: No Muscle: No Muscle: No Joint: No Joint: No Bone: No Bone: No Epithelialization: None None N/A Debridement: Debridement - Excisional N/A N/A Pre-procedure Verification/Time 13:45 N/A N/A Out Taken: Pain Control: Lidocaine N/A N/A Tissue Debrided: Subcutaneous, Slough N/A N/A Level: Skin/Subcutaneous Tissue N/A N/A Debridement Area (sq cm): 32 N/A N/A Instrument:  Curette N/A N/A Bleeding: Large N/A N/A Hemostasis Achieved: Silver Nitrate N/A N/A Debridement Treatment Procedure was tolerated well N/A N/A Response: 4x8x1 N/A N/A HERMAN, FIERO ([id number]) Post Debridement Measurements L x W x D (cm) Post Debridement Volume: 25.133 N/A N/A (cm) Post Debridement Stage: Category/Stage III N/A N/A Procedures Performed: Debridement N/A N/A Treatment Notes Wound #2 (Midline Sacrum) Notes Skin Prep, silver alginate, bordered foam Wound #3 (Right Gluteus) Notes Skin Prep, silver alginate, bordered foam Electronic Signature(s) Signed: 12/04/2019 4:28:02 PM By: Linton Ham MD Entered By: Linton Ham on 12/04/2019 16:17:23 [name] ([id number]) -------------------------------------------------------------------------------- Multi-Disciplinary Care Plan Details Patient Name: Colton Lamb, Colton Lamb. Date of Service: 12/04/2019 1:00 PM Medical  Record Number: [id number] Patient Account Number: [id number] Date of Birth/Sex: 1951-01-24 (68 y.o. M) Treating RN: Cornell Barman Primary Care Ravneet Spilker: [name] Other Clinician: Referring Sumiya Mamaril: Treating Shine Mikes/Extender: Tito Dine in Treatment: 0 Active Inactive Necrotic Tissue Nursing Diagnoses: Impaired tissue integrity related to necrotic/devitalized tissue Goals: Necrotic/devitalized tissue will be minimized in the wound bed Date Initiated: 12/04/2019 Target Resolution Date: 12/18/2019 Goal Status: Active Interventions: Assess patient pain level pre-, during and post procedure and prior to discharge Treatment Activities: Apply topical anesthetic as ordered : 12/04/2019 Notes: Orientation to the Wound Care Program Nursing Diagnoses: Knowledge deficit related to the wound healing center program Goals: Patient/caregiver will verbalize understanding of the [place] Date Initiated: 12/04/2019 Target Resolution Date: 12/18/2019 Goal Status: Active Interventions: Provide education on orientation to the wound center Notes: Pressure Nursing Diagnoses: Knowledge deficit related to causes and risk factors for pressure ulcer development Knowledge deficit related to management of pressures ulcers Goals: Patient will remain free from development of additional pressure ulcers Date Initiated: 12/04/2019 Target Resolution Date: 12/11/2019 Goal Status: Active Patient/caregiver will verbalize risk factors for pressure ulcer development Date Initiated: 12/04/2019 Target Resolution Date: 12/11/2019 Goal Status: Active Interventions: Assess: immobility, friction, shearing, incontinence upon admission and as needed Notes: Wound/Skin Impairment Nursing Diagnoses: BUDDY, LOEFFELHOLZ ([id number]) Impaired tissue integrity Goals: Ulcer/skin breakdown will have a volume reduction of 30% by week 4 Date Initiated: 12/04/2019 Target Resolution Date:  01/01/2020 Goal Status: Active Interventions: Assess ulceration(s) every visit Treatment Activities: Skin care regimen initiated : 12/04/2019 Topical wound management initiated : 12/04/2019 Notes: Electronic Signature(s) Signed: 12/04/2019 5:22:43 PM By: Gretta Cool, BSN, RN, CWS, Kim RN, BSN Entered By: Gretta Cool, BSN, RN, CWS, Kim on 12/04/2019 13:54:43 [name] ([id number]) -------------------------------------------------------------------------------- Pain Assessment Details Patient Name: Colton Lamb, Colton Lamb. Date of Service: 12/04/2019 1:00 PM Medical Record Number: [id number] Patient Account Number: [id number] Date of Birth/Sex: [birth year] (68 y.o. M) Treating RN: Cornell Barman Primary Care Rahmon Heigl: [name] Other Clinician: Referring Gaige Fussner: Treating Georgeana Oertel/Extender: Tito Dine in Treatment: 0 Active Problems Location of Pain Severity and Description of Pain Patient Has Paino No Site Locations Pain Management and Medication Current Pain Management: Electronic Signature(s) Signed: 12/04/2019 4:20:30 PM By: Lorine Bears RCP, RRT, CHT Signed: 12/04/2019 5:22:43 PM By: Gretta Cool, BSN, RN, CWS, Kim RN, BSN Entered By: Lorine Bears on 12/04/2019 13:30:17 [name] ([id number]) -------------------------------------------------------------------------------- Patient/Caregiver Education Details Patient Name: Colton Lamb, Colton Lamb. Date of Service: 12/04/2019 1:00 PM Medical Record Number: [id number] Patient Account Number: [id number] Date of Birth/Gender: [birth year] (69 y.o. M) Treating RN: Cornell Barman Primary Care Physician: [name] Other Clinician: Referring Physician: Treating Physician/Extender: Tito Dine in Treatment: 0 Education Assessment Education Provided To: Patient Education Topics Provided Welcome To [facility] [place]: Handouts: Welcome  To [facility] [place] Methods: Demonstration,  Explain/Verbal Responses: State content correctly Wound Debridement: Handouts: Wound Debridement Methods: Demonstration, Explain/Verbal Responses: State content correctly Wound/Skin Impairment: Handouts: Caring for Your Ulcer Methods: Demonstration, Explain/Verbal Responses: State content correctly Electronic Signature(s) Signed: 12/04/2019 5:22:43 PM By: Gretta Cool, BSN, RN, CWS, Kim RN, BSN Entered By: Gretta Cool, BSN, RN, CWS, Kim on 12/04/2019 14:00:36 Colton Lamb, Colton Lamb ([id number]) -------------------------------------------------------------------------------- Wound Assessment Details Patient Name: Colton Lamb, Colton Lamb. Date of Service: 12/04/2019 1:00 PM Medical Record Number: [id number] Patient Account Number: [id number] Date of Birth/Sex: 21-Nov-1950 (68 y.o. M) Treating RN: Army Melia Primary Care Alonte Wulff: [name] Other Clinician: Referring Mance Vallejo: Treating Deckard Stuber/Extender: Tito Dine in Treatment: 0 Wound Status Wound Number: 2 Primary Pressure Ulcer Etiology: Wound Location: Midline Sacrum Wound Open Wounding Event: Pressure Injury Status: Date Acquired: 06/02/2019 Comorbid Arrhythmia, Congestive Heart Failure, Coronary Artery Weeks Of Treatment: 0 History: Disease, Hypertension, Myocardial Infarction, Crohnos, Clustered Wound: No Type II Diabetes, History of pressure wounds Photos Wound Measurements Length: (cm) 4 % Red Width: (cm) 8 % Red Depth: (cm) 0.8 Epith Area: (cm) 25.133 Tunn Volume: (cm) 20.106 Unde uction in Area: uction in Volume: elialization: None eling: No rmining: No Wound Description Classification: Category/Stage III Foul Wound Margin: Flat and Intact Sloug Exudate Amount: Medium Exudate Type: Serosanguineous Exudate Color: red, brown Odor After Cleansing: No h/Fibrino Yes Wound Bed Granulation Amount: Medium (34-66%) Exposed Structure Granulation Quality: Red Fascia Exposed: No Necrotic Amount: Medium (34-66%) Fat  Layer (Subcutaneous Tissue) Exposed: Yes Necrotic Quality: Adherent Slough Tendon Exposed: No Muscle Exposed: No Joint Exposed: No Bone Exposed: No Treatment Notes Wound #2 (Midline Sacrum) Notes Skin Prep, silver alginate, bordered foam Electronic Signature(s) Colton Lamb, Colton Lamb ([id number]) Signed: 12/04/2019 2:57:49 PM By: Army Melia Entered By: Army Melia on 12/04/2019 13:41:58 [name] ([id number]) -------------------------------------------------------------------------------- Wound Assessment Details Patient Name: [name]. Date of Service: 12/04/2019 1:00 PM Medical Record Number: [id number] Patient Account Number: [id number] Date of Birth/Sex: [birth year] (68 y.o. M) Treating RN: Army Melia Primary Care Laurna Shetley: [name] Other Clinician: Referring Cyris Maalouf: Treating Fionn Stracke/Extender: Tito Dine in Treatment: 0 Wound Status Wound Number: 3 Primary Pressure Ulcer Etiology: Wound Location: Right Gluteus Wound Open Wounding Event: Pressure Injury Status: Date Acquired: 06/02/2019 Comorbid Arrhythmia, Congestive Heart Failure, Coronary Artery Weeks Of Treatment: 0 History: Disease, Hypertension, Myocardial Infarction, Crohnos, Clustered Wound: No Type II Diabetes, History of pressure wounds Photos Wound Measurements Length: (cm) 1 % R Width: (cm) 0.8 % R Depth: (cm) 0.1 Epi Area: (cm) 0.628 Tu Volume: (cm) 0.063 Un eduction in Area: eduction in Volume: thelialization: None nneling: No dermining: No Wound Description Classification: Category/Stage II Fou Wound Margin: Flat and Intact Slo Exudate Amount: Medium Exudate Type: Serosanguineous Exudate Color: red, brown l Odor After Cleansing: No ugh/Fibrino Yes Wound Bed Granulation Amount: Medium (34-66%) Exposed Structure Granulation Quality: Red Fascia Exposed: No Necrotic Amount: Medium (34-66%) Fat Layer (Subcutaneous Tissue) Exposed: Yes Necrotic Quality:  Adherent Slough Tendon Exposed: No Muscle Exposed: No Joint Exposed: No Bone Exposed: No Treatment Notes Wound #3 (Right Gluteus) Notes Skin Prep, silver alginate, bordered foam Electronic Signature(s) KHYRIE, MASI ([id number]) Signed: 12/04/2019 2:57:49 PM By: Army Melia Entered By: Army Melia on 12/04/2019 13:43:18 [name] ([id number]) -------------------------------------------------------------------------------- Vitals Details Patient Name: [name] Date of Service: 12/04/2019 1:00 PM Medical Record Number: [id number] Patient Account Number: [id number] Date of Birth/Sex: [birth year] (68 y.o. M) Treating RN: Cornell Barman Primary Care Naesha Buckalew: [name] Other Clinician: Referring Charda Janis:  Treating  Cohick/Extender: Ricard Dillon Weeks in Treatment: 0 Vital Signs Time Taken: 13:30 Temperature (F): 98.8 Pulse (bpm): 88 Respiratory Rate (breaths/min): 16 Blood Pressure (mmHg): 137/87 Reference Range: 80 - 120 mg / dl Electronic Signature(s) Signed: 12/04/2019 4:20:30 PM By: Lorine Bears RCP, RRT, CHT Entered By: Lorine Bears on 12/04/2019 13:31:35

## 2019-12-05 NOTE — Progress Notes (Signed)
Colton Lamb ([id number]) Visit Report for 12/04/2019 Debridement Details Patient Name: Colton Lamb, Colton Lamb. Date of Service: 12/04/2019 1:00 PM Medical Record Number: [id number] Patient Account Number: [id number] Date of Birth/Sex: [birth year] (68 y.o. M) Treating RN: Cornell Barman Primary Care Provider: [name] Other Clinician: Referring Provider: Treating Provider/Extender: Tito Dine in Treatment: 0 Debridement Performed for Wound #2 Midline Sacrum Assessment: Performed By: Physician Ricard Dillon, MD Debridement Type: Debridement Level of Consciousness (Pre- Awake and Alert procedure): Pre-procedure Verification/Time Out Yes - 13:45 Taken: Start Time: 13:45 Pain Control: Lidocaine Total Area Debrided (L x W): 4 (cm) x 8 (cm) = 32 (cm) Tissue and other material Slough, Subcutaneous, Slough debrided: Level: Skin/Subcutaneous Tissue Debridement Description: Excisional Instrument: Curette Bleeding: Large Hemostasis Achieved: Silver Nitrate Response to Treatment: Procedure was tolerated well Level of Consciousness (Post- Awake and Alert procedure): Post Debridement Measurements of Total Wound Length: (cm) 4 Stage: Category/Stage III Width: (cm) 8 Depth: (cm) 1 Volume: (cm) 25.133 Character of Wound/Ulcer Post Debridement: Stable Post Procedure Diagnosis Same as Pre-procedure Electronic Signature(s) Signed: 12/04/2019 4:28:02 PM By: Linton Ham MD Signed: 12/04/2019 5:22:43 PM By: Gretta Cool, BSN, RN, CWS, Kim RN, BSN Entered By: Gretta Cool, BSN, RN, CWS, Kim on 12/04/2019 13:56:12 Colton Lamb, Colton Lamb ([id number]) -------------------------------------------------------------------------------- HPI Details Patient Name: Colton Lamb, Colton Lamb. Date of Service: 12/04/2019 1:00 PM Medical Record Number: [id number] Patient Account Number: [id number] Date of Birth/Sex: [birth year] (68 y.o. M) Treating RN: Cornell Barman Primary Care Provider: [name] Other  Clinician: Referring Provider: Treating Provider/Extender: Tito Dine in Treatment: 0 History of Present Illness HPI Description: 05/09/2019 upon evaluation today patient's wound bed actually showed signs of significant wound over the sacral area which does reveal bone exposed. The patient is a resident at [facility]. Subsequently he also does have a history of diabetes mellitus type 2, muscle weakness, cognitive communication deficit, a cardiac pacemaker, is on long-term use of anticoagulants including Eliquis as well as a baby aspirin, and has Crohn's disease. With that being said this wound has been present since around mid August when the patient was actually in the hospital and apparently according to reviewed notes and records had issues with pleural effusions and in the end did have a chest tube as well. Subsequently this was again noted at that time and really has not improved significantly since. I do not see any evidence of any x-rays being performed we likely are getting a do that today. Subsequently also have not seen any specific cultures that were done that is also something I am going to obtain today. Especially since there is bone exposed and I can definitely get a sample of this to send for culture. The patient is in agreement with the plan today. Subsequently depending on the results of the culture we would obviously be able to also initiate the appropriate antibiotic regimen which would be beneficial for him as well. No fevers, chills, nausea, vomiting, or diarrhea. 05/31/2019; after the patient was last here he was apparently admitted to hospital with UTI, pneumonia although have not had a chance to look at this discharge summary. With regards to the tests we ordered for his stage IV sacral ulcer last time the bone biopsy showed acute osteochondral myelitis and the bone culture showed  Citrobacter Amalonaticus. Plain x-ray of the sacrum and coccyx showed no fracture or lytic lesion. No osteomyelitis. I cannot see that this was actually addressed. He was on Rocephin  early in October however the Citrobacter is resistant to this. We have been using Dakin's wet-to-dry to the wounds READMISSION 12/04/2019 This is a disabled 69 year old man we actually had in this clinic on 2 visits in October 2020. At that time he came in with a stage IV pressure ulcer. The area was biopsied with the pathology showing acute osteomyelitis in the culture showing Citrobacter. He was sent to infectious disease but I do not know there was any direct follow-up. He was admitted to the hospital several times since then. This included an admission in late February from 09/23/2019 through 10/02/2019 with Pseudomonas sepsis. Bone biopsy and culture showed Proteus and VRE. Initially treated with IV Zosyn and linezolid however he became thrombocytopenic therefore the Zosyn was changed to daptomycin with oral Cipro and Flagyl. He had a CT scan of the pelvis on 09/24/2019 that showed a large presacral decubitus ulcer with progressive bone destruction of the distal sacrum and coccyx with changes consistent with osteomyelitis. He has followed with Dr. Steva Ready of infectious disease. Last seen on 11/26/2019 at that point it was noted that his antibiotics were completed on 10/21/2019. She did not think he required any additional antibiotics and commented that his wound actually looked is good is she is ever seen this. Past medical history includes type 2 diabetes, Crohn's disease, status post diverting colostomy, chronic renal failure, paroxysmal atrial fib on Xarelto, defibrillator, heart failure with reduced ejection fraction with an EF of 25 to 30%. He was at 1 time at [facility] but he is now at [facility] in [place] He arrived in our clinic with a border foam dressing. Our intake nurse noted yellow-green  drainage Electronic Signature(s) Signed: 12/04/2019 4:28:02 PM By: Linton Ham MD Entered By: Linton Ham on 12/04/2019 16:22:00 Colton Lamb ([id number]) -------------------------------------------------------------------------------- Physical Exam Details Patient Name: Colton Lamb, Colton Lamb. Date of Service: 12/04/2019 1:00 PM Medical Record Number: [id number] Patient Account Number: [id number] Date of Birth/Sex: 11/30/1950 (68 y.o. M) Treating RN: Cornell Barman Primary Care Provider: [name] Other Clinician: Referring Provider: Treating Provider/Extender: Tito Dine in Treatment: 0 Constitutional Sitting or standing Blood Pressure is within target range for patient.. Pulse regular and within target range for patient.Marland Kitchen Respirations regular, non- labored and within target range.. Temperature is normal and within the target range for the patient.Marland Kitchen appears in no distress. Respiratory Respiratory effort is easy and symmetric bilaterally. Rate is normal at rest and on room air.. Bronchial breathing over the entire left lower lobe right lung is clear. Cardiovascular Heart rhythm and rate regular, without murmur or gallop.. Gastrointestinal (GI) Ostomy noted. Genitourinary (GU) Chronic Foley catheter apparently secondary to BPH. Psychiatric No evidence of depression, anxiety, or agitation. Calm, cooperative, and communicative. Appropriate interactions and affect.. Notes Wound exam; the major wounds on the lower sacrum/coccyx. This actually has a hyper granulated surface along with very adherent surface slough. I used an open curette to debride this. Hemostasis with silver nitrate and direct pressure. Really hoping to get a healthier looking granulation that might support further epithelialization. There is no exposed bone oHe has a small wound on the lower right gluteal just around the gluteal fold this is a superficial stage II Electronic Signature(s) Signed:  12/04/2019 4:28:02 PM By: Linton Ham MD Entered By: Linton Ham on 12/04/2019 16:24:07 Colton Lamb ([id number]) -------------------------------------------------------------------------------- Physician Orders Details Patient Name: Colton Lamb, Colton Lamb. Date of Service: 12/04/2019 1:00 PM Medical Record Number: [id number] Patient Account Number: [id number] Date of Birth/Sex: [birth year] (68  y.o. M) Treating RN: Cornell Barman Primary Care Provider: [name] Other Clinician: Referring Provider: Treating Provider/Extender: Tito Dine in Treatment: 0 Verbal / Phone Orders: No Diagnosis Coding Wound Cleansing Wound #2 Midline Sacrum o Cleanse wound with mild soap and water o May Shower, gently pat wound dry prior to applying new dressing. Wound #3 Right Gluteus o Cleanse wound with mild soap and water o May Shower, gently pat wound dry prior to applying new dressing. Anesthetic (add to Medication List) Wound #2 Midline Sacrum o Topical Lidocaine 4% cream applied to wound bed prior to debridement (In Clinic Only). Wound #3 Right Gluteus o Topical Lidocaine 4% cream applied to wound bed prior to debridement (In Clinic Only). Skin Barriers/Peri-Wound Care Wound #2 Midline Sacrum o Skin Prep Wound #3 Right Gluteus o Skin Prep Primary Wound Dressing Wound #2 Midline Sacrum o Silver Alginate Wound #3 Right Gluteus o Silver Alginate Secondary Dressing Wound #2 Midline Sacrum o Boardered Foam Dressing Wound #3 Right Gluteus o Boardered Foam Dressing Dressing Change Frequency Wound #2 Midline Sacrum o Change dressing every other day. - or more often if soiled. Wound #3 Right Gluteus o Change dressing every other day. - or more often if soiled. Follow-up Appointments Wound #2 Midline Sacrum o Return Appointment in 2 weeks. Wound #3 Right Gluteus o Return Appointment in 2 weeks. Off-Loading Wound #2 Midline Sacrum o Roho  cushion for wheelchair Colton Lamb, Colton Lamb ([id number]) o Mattress - Air mattress o Turn and reposition every 2 hours Wound #3 Right Gluteus o Roho cushion for wheelchair o Mattress - Air mattress o Turn and reposition every 2 hours Additional Orders / Instructions Wound #2 Midline Sacrum o Increase protein intake. Wound #3 Right Gluteus o Increase protein intake. Electronic Signature(s) Signed: 12/04/2019 4:28:02 PM By: Linton Ham MD Signed: 12/04/2019 5:22:43 PM By: Gretta Cool, BSN, RN, CWS, Kim RN, BSN Entered By: Gretta Cool, BSN, RN, CWS, Kim on 12/04/2019 13:58:59 Colton Lamb, Colton Lamb ([id number]) -------------------------------------------------------------------------------- Problem List Details Patient Name: Colton Lamb, Colton Lamb. Date of Service: 12/04/2019 1:00 PM Medical Record Number: [id number] Patient Account Number: [id number] Date of Birth/Sex: 01-26-1951 (68 y.o. M) Treating RN: Cornell Barman Primary Care Provider: [name] Other Clinician: Referring Provider: Treating Provider/Extender: Tito Dine in Treatment: 0 Active Problems ICD-10 Encounter Code Description Active Date MDM Diagnosis L89.153 Pressure ulcer of sacral region, stage 3 12/04/2019 No Yes M86.68 Other chronic osteomyelitis, other site 12/04/2019 No Yes L89.312 Pressure ulcer of right buttock, stage 2 12/04/2019 No Yes Inactive Problems Resolved Problems Electronic Signature(s) Signed: 12/04/2019 4:28:02 PM By: Linton Ham MD Entered By: Linton Ham on 12/04/2019 16:17:15 Colton Lamb ([id number]) -------------------------------------------------------------------------------- Progress Note Details Patient Name: Colton Lamb. Date of Service: 12/04/2019 1:00 PM Medical Record Number: [id number] Patient Account Number: [id number] Date of Birth/Sex: 1950/08/04 (68 y.o. M) Treating RN: Cornell Barman Primary Care Provider: [name] Other Clinician: Referring  Provider: Treating Provider/Extender: Tito Dine in Treatment: 0 Subjective History of Present Illness (HPI) 05/09/2019 upon evaluation today patient's wound bed actually showed signs of significant wound over the sacral area which does reveal bone exposed. The patient is a resident at [facility]. Subsequently he also does have a history of diabetes mellitus type 2, muscle weakness, cognitive communication deficit, a cardiac pacemaker, is on long-term use of anticoagulants including Eliquis as well as a baby aspirin, and has Crohn's disease. With that being said this wound has been present  since around mid August when the patient was actually in the hospital and apparently according to reviewed notes and records had issues with pleural effusions and in the end did have a chest tube as well. Subsequently this was again noted at that time and really has not improved significantly since. I do not see any evidence of any x-rays being performed we likely are getting a do that today. Subsequently also have not seen any specific cultures that were done that is also something I am going to obtain today. Especially since there is bone exposed and I can definitely get a sample of this to send for culture. The patient is in agreement with the plan today. Subsequently depending on the results of the culture we would obviously be able to also initiate the appropriate antibiotic regimen which would be beneficial for him as well. No fevers, chills, nausea, vomiting, or diarrhea. 05/31/2019; after the patient was last here he was apparently admitted to hospital with UTI, pneumonia although have not had a chance to look at this discharge summary. With regards to the tests we ordered for his stage IV sacral ulcer last time the bone biopsy showed acute osteochondral myelitis and the bone culture showed Citrobacter Amalonaticus.  Plain x-ray of the sacrum and coccyx showed no fracture or lytic lesion. No osteomyelitis. I cannot see that this was actually addressed. He was on Rocephin early in October however the Citrobacter is resistant to this. We have been using Dakin's wet-to-dry to the wounds READMISSION 12/04/2019 This is a disabled 69 year old man we actually had in this clinic on 2 visits in October 2020. At that time he came in with a stage IV pressure ulcer. The area was biopsied with the pathology showing acute osteomyelitis in the culture showing Citrobacter. He was sent to infectious disease but I do not know there was any direct follow-up. He was admitted to the hospital several times since then. This included an admission in late February from 09/23/2019 through 10/02/2019 with Pseudomonas sepsis. Bone biopsy and culture showed Proteus and VRE. Initially treated with IV Zosyn and linezolid however he became thrombocytopenic therefore the Zosyn was changed to daptomycin with oral Cipro and Flagyl. He had a CT scan of the pelvis on 09/24/2019 that showed a large presacral decubitus ulcer with progressive bone destruction of the distal sacrum and coccyx with changes consistent with osteomyelitis. He has followed with Dr. Steva Ready of infectious disease. Last seen on 11/26/2019 at that point it was noted that his antibiotics were completed on 10/21/2019. She did not think he required any additional antibiotics and commented that his wound actually looked is good is she is ever seen this. Past medical history includes type 2 diabetes, Crohn's disease, status post diverting colostomy, chronic renal failure, paroxysmal atrial fib on Xarelto, defibrillator, heart failure with reduced ejection fraction with an EF of 25 to 30%. He was at 1 time at [place] [facility] but he is now at [facility] in [place] He arrived in our clinic with a border foam dressing. Our intake nurse noted yellow-green drainage Patient  History Information obtained from Patient. Allergies Shellfish Containing Products, shrimp, tetracycline HCl Family History Cancer - Mother, Diabetes - Siblings, Heart Disease - Father,Mother, Hypertension - Father,Mother, No family history of Hereditary Spherocytosis, Kidney Disease, Lung Disease, Seizures, Stroke, Thyroid Problems, Tuberculosis. Social History Never smoker, Marital Status - Widowed, Alcohol Use - Never, Drug Use - No History, Caffeine Use - Never. Medical History Ear/Nose/Mouth/Throat Denies history of Chronic sinus  problems/congestion, Middle ear problems Cardiovascular Patient has history of Arrhythmia - a flutter, a fib, Congestive Heart Failure, Coronary Artery Disease, Hypertension, Myocardial Infarction - 2007, 2015 Denies history of Angina, Deep Vein Thrombosis, Hypotension, Peripheral Arterial Disease, Peripheral Venous Disease, Phlebitis, Vasculitis Gastrointestinal Patient has history of Crohn s Denies history of Cirrhosis , Colitis, Hepatitis A, Hepatitis B, Hepatitis C Endocrine Patient has history of Type II Diabetes Denies history of Type I Diabetes Colton Lamb, Colton Lamb ([id number]) Integumentary (Skin) Patient has history of History of pressure wounds Denies history of History of Burn Musculoskeletal Denies history of Gout, Rheumatoid Arthritis, Osteoarthritis, Osteomyelitis Psychiatric Denies history of Anorexia/bulimia, Confinement Anxiety Hospitalization/Surgery History - [facility] wounds. Medical And Surgical History Notes Ear/Nose/Mouth/Throat difficulty speaking Cardiovascular ischemic cardiomyopathy, HLD Gastrointestinal GERD Musculoskeletal unable to stand to transfer Psychiatric impaired cognition Review of Systems (ROS) Constitutional Symptoms (General Health) Denies complaints or symptoms of Fatigue, Fever, Chills, Marked Weight Change. Eyes Denies complaints or symptoms of Dry Eyes, Vision Changes, Glasses /  Contacts. Ear/Nose/Mouth/Throat Denies complaints or symptoms of Difficult clearing ears, Sinusitis. Hematologic/Lymphatic Denies complaints or symptoms of Bleeding / Clotting Disorders, Human Immunodeficiency Virus. Respiratory Denies complaints or symptoms of Chronic or frequent coughs, Shortness of Breath. Cardiovascular Denies complaints or symptoms of Chest pain, LE edema. Gastrointestinal Denies complaints or symptoms of Frequent diarrhea, Nausea, Vomiting. Endocrine Denies complaints or symptoms of Hepatitis, Thyroid disease, Polydypsia (Excessive Thirst). Genitourinary Denies complaints or symptoms of Kidney failure/ Dialysis, Incontinence/dribbling. Immunological Denies complaints or symptoms of Hives, Itching. Integumentary (Skin) Complains or has symptoms of Wounds, Breakdown. Denies complaints or symptoms of Bleeding or bruising tendency, Swelling. Musculoskeletal Denies complaints or symptoms of Muscle Pain, Muscle Weakness. Neurologic Denies complaints or symptoms of Numbness/parasthesias, Focal/Weakness. Psychiatric Denies complaints or symptoms of Anxiety, Claustrophobia. Objective Constitutional Sitting or standing Blood Pressure is within target range for patient.. Pulse regular and within target range for patient.Marland Kitchen Respirations regular, non- labored and within target range.. Temperature is normal and within the target range for the patient.Marland Kitchen appears in no distress. Vitals Time Taken: 1:30 PM, Temperature: 98.8 F, Pulse: 88 bpm, Respiratory Rate: 16 breaths/min, Blood Pressure: 137/87 mmHg. Respiratory Respiratory effort is easy and symmetric bilaterally. Rate is normal at rest and on room air.. Bronchial breathing over the entire left lower lobe right lung is clear. Cardiovascular Heart rhythm and rate regular, without murmur or gallop.. Gastrointestinal (GI) Ostomy noted. Colton Lamb, Colton Lamb ([id number]) Genitourinary (GU) Chronic Foley catheter apparently  secondary to BPH. Psychiatric No evidence of depression, anxiety, or agitation. Calm, cooperative, and communicative. Appropriate interactions and affect.. General Notes: Wound exam; the major wounds on the lower sacrum/coccyx. This actually has a hyper granulated surface along with very adherent surface slough. I used an open curette to debride this. Hemostasis with silver nitrate and direct pressure. Really hoping to get a healthier looking granulation that might support further epithelialization. There is no exposed bone He has a small wound on the lower right gluteal just around the gluteal fold this is a superficial stage II Integumentary (Hair, Skin) Wound #2 status is Open. Original cause of wound was Pressure Injury. The wound is located on the Midline Sacrum. The wound measures 4cm length x 8cm width x 0.8cm depth; 25.133cm^2 area and 20.106cm^3 volume. There is Fat Layer (Subcutaneous Tissue) Exposed exposed. There is no tunneling or undermining noted. There is a medium amount of serosanguineous drainage noted. The wound margin is flat and intact. There is medium (34-66%) red granulation within the wound bed. There is  a medium (34-66%) amount of necrotic tissue within the wound bed including Adherent Slough. Wound #3 status is Open. Original cause of wound was Pressure Injury. The wound is located on the Right Gluteus. The wound measures 1cm length x 0.8cm width x 0.1cm depth; 0.628cm^2 area and 0.063cm^3 volume. There is Fat Layer (Subcutaneous Tissue) Exposed exposed. There is no tunneling or undermining noted. There is a medium amount of serosanguineous drainage noted. The wound margin is flat and intact. There is medium (34-66%) red granulation within the wound bed. There is a medium (34-66%) amount of necrotic tissue within the wound bed including Adherent Slough. Assessment Active Problems ICD-10 Pressure ulcer of sacral region, stage 3 Other chronic osteomyelitis, other  site Pressure ulcer of right buttock, stage 2 Procedures Wound #2 Pre-procedure diagnosis of Wound #2 is a Pressure Ulcer located on the Midline Sacrum . There was a Excisional Skin/Subcutaneous Tissue Debridement with a total area of 32 sq cm performed by Ricard Dillon, MD. With the following instrument(s): Curette Material removed includes Subcutaneous Tissue and Slough and after achieving pain control using Lidocaine. No specimens were taken. A time out was conducted at 13:45, prior to the start of the procedure. A Large amount of bleeding was controlled with Silver Nitrate. The procedure was tolerated well. Post Debridement Measurements: 4cm length x 8cm width x 1cm depth; 25.133cm^3 volume. Post debridement Stage noted as Category/Stage III. Character of Wound/Ulcer Post Debridement is stable. Post procedure Diagnosis Wound #2: Same as Pre-Procedure Plan Wound Cleansing: Wound #2 Midline Sacrum: Cleanse wound with mild soap and water May Shower, gently pat wound dry prior to applying new dressing. Wound #3 Right Gluteus: Cleanse wound with mild soap and water May Shower, gently pat wound dry prior to applying new dressing. Anesthetic (add to Medication List): Wound #2 Midline Sacrum: Topical Lidocaine 4% cream applied to wound bed prior to debridement (In Clinic Only). Wound #3 Right Gluteus: Topical Lidocaine 4% cream applied to wound bed prior to debridement (In Clinic Only). Skin Barriers/Peri-Wound Care: Wound #2 Midline SacrumMASAO, Colton Lamb ([id number]) Skin Prep Wound #3 Right Gluteus: Skin Prep Primary Wound Dressing: Wound #2 Midline Sacrum: Silver Alginate Wound #3 Right Gluteus: Silver Alginate Secondary Dressing: Wound #2 Midline Sacrum: Boardered Foam Dressing Wound #3 Right Gluteus: Boardered Foam Dressing Dressing Change Frequency: Wound #2 Midline Sacrum: Change dressing every other day. - or more often if soiled. Wound #3 Right Gluteus: Change  dressing every other day. - or more often if soiled. Follow-up Appointments: Wound #2 Midline Sacrum: Return Appointment in 2 weeks. Wound #3 Right Gluteus: Return Appointment in 2 weeks. Off-Loading: Wound #2 Midline Sacrum: Roho cushion for wheelchair Mattress - Air mattress Turn and reposition every 2 hours Wound #3 Right Gluteus: Roho cushion for wheelchair Mattress - Air mattress Turn and reposition every 2 hours Additional Orders / Instructions: Wound #2 Midline Sacrum: Increase protein intake. Wound #3 Right Gluteus: Increase protein intake. 1. I change the primary dressing to silver alginate wanting some absorptive and antibacterial activity 2. Border foam dressing to be changed every second day and as needed 3. I did not see any a current evidence of infection I did not do any cultures. No empiric antibiotics 4. Per recent review of ID Dr. Steva Ready no further antibiotics were felt to be necessary. I am uncertain whether inflammatory markers were done for comparison purposes I will try to check this in [place] link when he is next here in 2 weeks. I spent 35  minutes in review of this patient's past medical history, hospital records, ID records face-to-face evaluation and preparation of this record Electronic Signature(s) Signed: 12/04/2019 4:28:02 PM By: Linton Ham MD Entered By: Linton Ham on 12/04/2019 16:26:18 Colton Lamb ([id number]) -------------------------------------------------------------------------------- ROS/PFSH Details Patient Name: Colton Lamb, Colton Lamb. Date of Service: 12/04/2019 1:00 PM Medical Record Number: [id number] Patient Account Number: [id number] Date of Birth/Sex: [birth year] (68 y.o. M) Treating RN: Army Melia Primary Care Provider: [name] Other Clinician: Referring Provider: Treating Provider/Extender: Tito Dine in Treatment: 0 Information Obtained From Patient Constitutional Symptoms (General  Health) Complaints and Symptoms: Negative for: Fatigue; Fever; Chills; Marked Weight Change Eyes Complaints and Symptoms: Negative for: Dry Eyes; Vision Changes; Glasses / Contacts Ear/Nose/Mouth/Throat Complaints and Symptoms: Negative for: Difficult clearing ears; Sinusitis Medical History: Negative for: Chronic sinus problems/congestion; Middle ear problems Past Medical History Notes: difficulty speaking Hematologic/Lymphatic Complaints and Symptoms: Negative for: Bleeding / Clotting Disorders; Human Immunodeficiency Virus Respiratory Complaints and Symptoms: Negative for: Chronic or frequent coughs; Shortness of Breath Cardiovascular Complaints and Symptoms: Negative for: Chest pain; LE edema Medical History: Positive for: Arrhythmia - a flutter, a fib; Congestive Heart Failure; Coronary Artery Disease; Hypertension; Myocardial Infarction - 2007, 2015 Negative for: Angina; Deep Vein Thrombosis; Hypotension; Peripheral Arterial Disease; Peripheral Venous Disease; Phlebitis; Vasculitis Past Medical History Notes: ischemic cardiomyopathy, HLD Gastrointestinal Complaints and Symptoms: Negative for: Frequent diarrhea; Nausea; Vomiting Medical History: Positive for: Crohnos Negative for: Cirrhosis ; Colitis; Hepatitis A; Hepatitis B; Hepatitis C Past Medical History Notes: GERD Endocrine Complaints and Symptoms: Negative for: Hepatitis; Thyroid disease; Polydypsia (Excessive Thirst) Medical History: Colton Lamb, GOSSE ([id number]) Positive for: Type II Diabetes Negative for: Type I Diabetes Time with diabetes: 6 years Treated with: Oral agents Genitourinary Complaints and Symptoms: Negative for: Kidney failure/ Dialysis; Incontinence/dribbling Immunological Complaints and Symptoms: Negative for: Hives; Itching Integumentary (Skin) Complaints and Symptoms: Positive for: Wounds; Breakdown Negative for: Bleeding or bruising tendency; Swelling Medical History: Positive  for: History of pressure wounds Negative for: History of Burn Musculoskeletal Complaints and Symptoms: Negative for: Muscle Pain; Muscle Weakness Medical History: Negative for: Gout; Rheumatoid Arthritis; Osteoarthritis; Osteomyelitis Past Medical History Notes: unable to stand to transfer Neurologic Complaints and Symptoms: Negative for: Numbness/parasthesias; Focal/Weakness Psychiatric Complaints and Symptoms: Negative for: Anxiety; Claustrophobia Medical History: Negative for: Anorexia/bulimia; Confinement Anxiety Past Medical History Notes: impaired cognition Oncologic Immunizations Pneumococcal Vaccine: Received Pneumococcal Vaccination: Yes Implantable Devices Yes Hospitalization / Surgery History Type of Hospitalization/Surgery [facility] wounds Family and Social History Cancer: Yes - Mother; Diabetes: Yes - Siblings; Heart Disease: Yes - Father,Mother; Hereditary Spherocytosis: No; Hypertension: Yes - Father,Mother; Kidney Disease: No; Lung Disease: No; Seizures: No; Stroke: No; Thyroid Problems: No; Tuberculosis: No; Never smoker; MUBARAK, BEVENS ([id number]) Marital Status - Widowed; Alcohol Use: Never; Drug Use: No History; Caffeine Use: Never; Financial Concerns: No; Food, Clothing or Shelter Needs: No; Support System Lacking: No; Transportation Concerns: No Electronic Signature(s) Signed: 12/04/2019 2:57:49 PM By: Army Melia Signed: 12/04/2019 4:28:02 PM By: Linton Ham MD Entered By: Army Melia on 12/04/2019 13:37:04 RODRICUS, CANDELARIA ([id number]) -------------------------------------------------------------------------------- SuperBill Details Patient Name: DWAIN, HUHN. Date of Service: 12/04/2019 Medical Record Number: [id number] Patient Account Number: [id number] Date of Birth/Sex: Jan 19, 1951 (69 y.o. M) Treating RN: Cornell Barman Primary Care Provider: [name] Other Clinician: Referring Provider: Treating Provider/Extender: Tito Dine in Treatment: 0 Diagnosis Coding ICD-10 Codes Code Description L89.153 Pressure ulcer of sacral region, stage 3 M86.68 Other chronic osteomyelitis, other site L89.312 Pressure ulcer  of right buttock, stage 2 Facility Procedures CPT4 Code: 20813887 Description: 19597 - WOUND CARE VISIT-LEV 3 EST PT Modifier: Quantity: 1 CPT4 Code: 47185501 Description: 58682 - DEB SUBQ TISSUE 20 SQ CM/< Modifier: Quantity: 1 CPT4 Code: Description: ICD-10 Diagnosis Description L89.153 Pressure ulcer of sacral region, stage 3 Modifier: Quantity: CPT4 Code: 57493552 Description: 17471 - DEB SUBQ TISS EA ADDL 20CM Modifier: Quantity: 1 CPT4 Code: Description: ICD-10 Diagnosis Description L89.153 Pressure ulcer of sacral region, stage 3 Modifier: Quantity: Physician Procedures CPT4 Code: 5953967 Description: 28979 - WC PHYS LEVEL 4 - EST PT Modifier: 25 Quantity: 1 CPT4 Code: Description: ICD-10 Diagnosis Description L89.153 Pressure ulcer of sacral region, stage 3 M86.68 Other chronic osteomyelitis, other site L89.312 Pressure ulcer of right buttock, stage 2 Modifier: Quantity: CPT4 Code: 1504136 Description: 11042 - WC PHYS SUBQ TISS 20 SQ CM Modifier: Quantity: 1 CPT4 Code: Description: ICD-10 Diagnosis Description L89.153 Pressure ulcer of sacral region, stage 3 Modifier: Quantity: CPT4 Code: 4383779 Description: 39688 - WC PHYS SUBQ TISS EA ADDL 20 CM Modifier: Quantity: 1 CPT4 Code: Description: ICD-10 Diagnosis Description L89.153 Pressure ulcer of sacral region, stage 3 Modifier: Quantity: Electronic Signature(s) Signed: 12/04/2019 4:28:02 PM By: Linton Ham MD Entered By: Linton Ham on 12/04/2019 16:26:55

## 2019-12-11 ENCOUNTER — Non-Acute Institutional Stay: Payer: Medicare Other | Admitting: Internal Medicine

## 2019-12-11 DIAGNOSIS — L89154 Pressure ulcer of sacral region, stage 4: Secondary | ICD-10-CM

## 2019-12-11 DIAGNOSIS — Z515 Encounter for palliative care: Secondary | ICD-10-CM

## 2019-12-12 ENCOUNTER — Other Ambulatory Visit: Payer: Self-pay

## 2019-12-12 NOTE — Progress Notes (Signed)
[facility] Consult Note Telephone: [phone]  Fax: [phone]  PATIENT NAME: Colton Lamb DOB: April 22, 1951 MRN: [id number]  PRIMARY CARE PROVIDER:  Dr. Jules Husbands  REFERRING PROVIDER: Dr. Jules Husbands  RESPONSIBLE PARTY:   Self and  Colton Lamb [phone]  [phone] lives in [place] and PLAN:  Palliative care encounter  Z51.5  1.  Adance care planning:  Advance directives remain as attempt CPR and full scope of treatment.  No tube feeding.  MOST form previously completed and in chart. His primary goal is heaing of sacral wound, improve strength and return of independent living.  Advance care planning will continue to follow patient  2.  Sacral decub with chronic osteomyeolitis:  "Improving".  Continue recommended wound care as previously ordered at wound center and wound care nurse.. Balanced meals and sustain hydration.    3.  Weakness:  Improved.  Continue physical therapy  I spent 20 minutes providing this consultation,  from1230  to 1250. More than 50% of the time in this consultation was spent coordinating communication patient and clinical staff. Marland Kitchen   HISTORY OF PRESENT ILLNESS: Follow-up with Colton Lamb.  He reports that he is feeling well and "wound is getting better". No reports of acute illness.  Ambulatory with assist of walker. Palliative Care was asked to help address goals of care.   CODE STATUS: FULL CODE  PPS:  40% HOSPICE ELIGIBILITY/DIAGNOSIS: TBD  PAST MEDICAL HISTORY:  Past Medical History:  Diagnosis Date  . Atrial flutter ([place])    a. s/p Cardioversion 11/22/13, on amiodarone and Xarelto.  . Chronic osteomyelitis ([place]) 06/30/2019   s/p colostomy  . Chronic systolic heart failure ([place])    a. 10/2013 EF 20-25%, grade III DD, RV mildly dilated and sys fx mild/mod reduced;  b. 01/2014 Echo: EF 30-35%, gr3 DD, mod dil LA. c. 07/2019 EF 20-25%  . Coronary artery disease    a. s/p MI 2007/2015;  b. s/p prior PCI to the LAD/LCX/PDA/PL;  c. 2008: s/p Cypher DES to the OM.  Marland Kitchen Crohn's ileocolitis ([facility])   . GERD (gastroesophageal reflux disease)   . Hx of adenomatous colonic polyps 11/2003  . Hyperlipidemia   . Hypertension   . Ischemic cardiomyopathy    s. 01/2014 s/p MDT [id number] [name] XT DR single lead AICD.  Marland Kitchen Obesity   . Paroxysmal atrial fibrillation (H[facility])    a. CHA2DS2VASc = (CHF, HTN, agex1, DM)  . Sleep apnea   . Syncope    a.  11/2013 in setting of volume depletion and bradycardia due to dig toxicity   . Type II diabetes mellitus ([place])      PERTINENT MEDICATIONS:  Outpatient Encounter Medications as of 12/11/2019  Medication Sig  . acetaminophen (TYLENOL) 325 MG tablet Take 1-2 tablets (325-650 mg total) by mouth every 4 (four) hours as needed for mild pain.  . Albuterol Sulfate 108 (90 Base) MCG/ACT AEPB Inhale 1 puff into the lungs every 6 (six) hours as needed (shortness of breath).   Marland Kitchen atorvastatin (LIPITOR) 80 MG tablet TAKE 1 TABLET BY MOUTH EVERY DAY (Patient taking differently: Take 80 mg by mouth daily. )  . balsalazide (COLAZAL) 750 MG capsule TAKE 1 CAPSULE (750 MG TOTAL) BY MOUTH 3 (THREE) TIMES DAILY. (Patient taking differently: Take 750 mg by mouth 3 (three) times daily. )  . digoxin (LANOXIN) 0.125 MG tablet Take 1 tablet (0.125 mg total) by mouth daily.  Marland Kitchen  ferrous sulfate 325 (65 FE) MG EC tablet Take 325 mg by mouth daily with breakfast.  . finasteride (PROSCAR) 5 MG tablet Take 1 tablet (5 mg total) by mouth daily.  Marland Kitchen HYDROcodone-acetaminophen (NORCO/VICODIN) 5-325 MG tablet Take 1-2 tablets by mouth every 4 (four) hours as needed for moderate pain.  Marland Kitchen insulin aspart (NOVOLOG) 100 UNIT/ML injection Inject 5 Units into the skin 3 (three) times daily with meals. (Patient taking differently: Inject 0-12 Units into the skin 2 (two) times daily. Sliding Scale:  70-200= 0 units, 201-250= 2 units, 251-300= 4 units, 301-350= 6 units, 351-400=8  units, 401-450= 10 units, 451-500= 12 units.)  . ipratropium-albuterol (DUONEB) 0.5-2.5 (3) MG/3ML SOLN Take 3 mLs by nebulization every 4 (four) hours as needed (shortness of breath or wheezing).  . metFORMIN (GLUCOPHAGE) 500 MG tablet Take 250 mg by mouth daily.   . midodrine (PROAMATINE) 10 MG tablet Take 1 tablet (10 mg total) by mouth 3 (three) times daily.  . Multiple Vitamins-Minerals (DECUBI-VITE PO) Take 1 capsule by mouth daily.   Marland Kitchen omeprazole (PRILOSEC) 20 MG capsule Take 20 mg by mouth daily.   . ondansetron (ZOFRAN-ODT) 4 MG disintegrating tablet Take 4 mg by mouth every 8 (eight) hours as needed for nausea or vomiting.   . saccharomyces boulardii (FLORASTOR) 250 MG capsule Take 250 mg by mouth 3 (three) times daily.  . tamsulosin (FLOMAX) 0.4 MG CAPS capsule Take 1 capsule (0.4 mg total) by mouth daily after breakfast.  . vitamin C (VITAMIN C) 500 MG tablet Take 1 tablet (500 mg total) by mouth 2 (two) times daily. (Patient taking differently: Take 1,000 mg by mouth 2 (two) times daily. )  . XARELTO 20 MG TABS tablet TAKE 1 TABLET BY MOUTH EVERY DAY WITH LUNCH (Patient taking differently: Take 20 mg by mouth daily with lunch. )   No facility-administered encounter medications on file as of 12/11/2019.    PHYSICAL EXAM:   General: NAD, chronically ill appearing Cardiovascular: regular rate and rhythm Pulmonary: clear ant fields Abdomen: soft, nontender, + bowel sounds.   GU: no suprapubic tenderness Extremities: no edema, Skin: reports of sacral wound but not visualized Neurological:Alert and oriented.  Weakness but otherwise nonfocal  Gonzella Lex, NP-C

## 2019-12-18 ENCOUNTER — Other Ambulatory Visit: Payer: Self-pay

## 2019-12-18 ENCOUNTER — Encounter: Payer: Medicare Other | Admitting: Internal Medicine

## 2019-12-18 DIAGNOSIS — L89153 Pressure ulcer of sacral region, stage 3: Secondary | ICD-10-CM | POA: Diagnosis not present

## 2019-12-19 NOTE — Progress Notes (Addendum)
ROBERTS, BON ([id number]) Visit Report for 12/18/2019 Arrival Information Details Patient Name: Colton Lamb, Colton Lamb. Date of Service: 12/18/2019 12:30 PM Medical Record Number: [id number] Patient Account Number: [id number] Date of Birth/Sex: 1951-07-12 (68 y.o. M) Treating RN: Cornell Barman Primary Care Provider: Lovie Macadamia, DAVID Other Clinician: Referring Provider: Lovie Macadamia, DAVID Treating Provider/Extender: Tito Dine in Treatment: 2 Visit Information History Since Last Visit Added or deleted any medications: No Patient Arrived: Stretcher Any new allergies or adverse reactions: No Arrival Time: 12:55 Had a fall or experienced change in No Accompanied By: EMS activities of daily living that may affect Transfer Assistance: None risk of falls: Patient Identification Verified: Yes Signs or symptoms of abuse/neglect since last visito No Secondary Verification Process Completed: Yes Hospitalized since last visit: No Implantable device outside of the clinic excluding No cellular tissue based products placed in the center since last visit: Has Dressing in Place as Prescribed: Yes Pain Present Now: No Electronic Signature(s) Signed: 12/18/2019 4:33:32 PM By: Lorine Bears RCP, RRT, CHT Entered By: Lorine Bears on 12/18/2019 12:57:58 Colton Lamb ([id number]) -------------------------------------------------------------------------------- Encounter Discharge Information Details Patient Name: Colton Lamb, Colton Lamb. Date of Service: 12/18/2019 12:30 PM Medical Record Number: [id number] Patient Account Number: [id number] Date of Birth/Sex: 1951/05/05 (68 y.o. M) Treating RN: Cornell Barman Primary Care Provider: [name] Other Clinician: Referring Provider: Lovie Macadamia, DAVID Treating Provider/Extender: Tito Dine in Treatment: 2 Encounter Discharge Information Items Post Procedure Vitals Discharge Condition: Stable Temperature  (F): 99.0 Ambulatory Status: Stretcher Pulse (bpm): 70 Discharge Destination: [place] Respiratory Rate (breaths/min): 18 Telephoned: No Blood Pressure (mmHg): 118/67 Orders Sent: Yes Transportation: Ambulance Accompanied By: EMS transport Schedule Follow-up Appointment: Yes Clinical Summary of Care: Electronic Signature(s) Signed: 12/19/2019 9:48:50 AM By: Gretta Cool, BSN, RN, CWS, Kim RN, BSN Entered By: Gretta Cool, BSN, RN, CWS, Kim on 12/18/2019 13:30:52 Colton Lamb ([id number]) -------------------------------------------------------------------------------- Lower Extremity Assessment Details Patient Name: Colton Lamb, Colton Lamb. Date of Service: 12/18/2019 12:30 PM Medical Record Number: [id number] Patient Account Number: [id number] Date of Birth/Sex: 1951/03/24 (68 y.o. M) Treating RN: Army Melia Primary Care Provider: [name] Other Clinician: Referring Provider: Lovie Macadamia, DAVID Treating Provider/Extender: Ricard Dillon Weeks in Treatment: 2 Electronic Signature(s) Signed: 12/18/2019 4:56:08 PM By: Army Melia Entered By: Army Melia on 12/18/2019 13:04:42 Colton Lamb ([id number]) -------------------------------------------------------------------------------- Multi Wound Chart Details Patient Name: Colton Lamb, Colton Lamb. Date of Service: 12/18/2019 12:30 PM Medical Record Number: [id number] Patient Account Number: [id number] Date of Birth/Sex: [birth year] (68 y.o. M) Treating RN: Cornell Barman Primary Care Provider: Lovie Macadamia, DAVID Other Clinician: Referring Provider: Lovie Macadamia, DAVID Treating Provider/Extender: Tito Dine in Treatment: 2 Vital Signs Height(in): Pulse(bpm): 62 Weight(lbs): Blood Pressure(mmHg): 118/67 Body Mass Index(BMI): Temperature(F): 99.0 Respiratory Rate(breaths/min): 16 Photos: [N/A:N/A] Wound Location: Midline Sacrum Right Gluteus N/A Wounding Event: Pressure Injury Pressure Injury N/A Primary  Etiology: Pressure Ulcer Pressure Ulcer N/A Comorbid History: Arrhythmia, Congestive Heart Arrhythmia, Congestive Heart N/A Failure, Coronary Artery Disease, Failure, Coronary Artery Disease, Hypertension, Myocardial Infarction, Hypertension, Myocardial Infarction, Crohnos, Type II Diabetes, History Crohnos, Type II Diabetes, History of pressure wounds of pressure wounds Date Acquired: 06/02/2019 06/02/2019 N/A Weeks of Treatment: 2 2 N/A Wound Status: Open Open N/A Measurements L x W x D (cm) 3.9x6.5x0.5 0.3x0.3x0.1 N/A Area (cm) : 19.91 0.071 N/A Volume (cm) : 9.955 0.007 N/A % Reduction in Area: 20.80% 88.70% N/A % Reduction in Volume: 50.50% 88.90% N/A Classification: Category/Stage III Category/Stage II N/A Exudate Amount: Medium Medium N/A Exudate Type: Serosanguineous Serosanguineous  N/A Exudate Color: red, brown red, brown N/A Wound Margin: Flat and Intact Flat and Intact N/A Granulation Amount: Medium (34-66%) Medium (34-66%) N/A Granulation Quality: Red Red N/A Necrotic Amount: Medium (34-66%) Medium (34-66%) N/A Exposed Structures: Fat Layer (Subcutaneous Tissue) Fat Layer (Subcutaneous Tissue) N/A Exposed: Yes Exposed: Yes Fascia: No Fascia: No Tendon: No Tendon: No Muscle: No Muscle: No Joint: No Joint: No Bone: No Bone: No Epithelialization: None None N/A Debridement: Debridement - Excisional N/A N/A Pre-procedure Verification/Time 13:25 N/A N/A Out Taken: Pain Control: Lidocaine N/A N/A Tissue Debrided: Subcutaneous, Slough N/A N/A Level: Skin/Subcutaneous Tissue N/A N/A Debridement Area (sq cm): 25.35 N/A N/A Instrument: Curette N/A N/A Bleeding: Moderate N/A N/A Hemostasis Achieved: Silver Nitrate N/A N/A Procedure was tolerated well N/A N/A GURFATEH, MCCLAIN ([id number]) Debridement Treatment Response: Post Debridement 3.9x6.5x0.7 N/A N/A Measurements L x W x D (cm) Post Debridement Volume: 13.937 N/A N/A (cm) Post Debridement Stage:  Category/Stage III N/A N/A Procedures Performed: Debridement N/A N/A Treatment Notes Wound #2 (Midline Sacrum) Notes Skin Prep, silver alginate, bordered foam Wound #3 (Right Gluteus) Notes Skin Prep, silver alginate, bordered foam Electronic Signature(s) Signed: 12/18/2019 5:06:12 PM By: Linton Ham MD Entered By: Linton Ham on 12/18/2019 13:58:21 Colton Lamb ([id number]) -------------------------------------------------------------------------------- Multi-Disciplinary Care Plan Details Patient Name: Colton Lamb, Colton Lamb. Date of Service: 12/18/2019 12:30 PM Medical Record Number: [id number] Patient Account Number: [id number] Date of Birth/Sex: Mar 15, 1951 (68 y.o. M) Treating RN: Cornell Barman Primary Care Provider: [name] Other Clinician: Referring Provider: [name] Treating Provider/Extender: Tito Dine in Treatment: 2 Active Inactive Electronic Signature(s) Signed: 01/03/2020 7:48:52 AM By: Gretta Cool, BSN, RN, CWS, Kim RN, BSN Previous Signature: 12/19/2019 9:48:50 AM Version By: Gretta Cool, BSN, RN, CWS, Kim RN, BSN Entered By: Gretta Cool, BSN, RN, CWS, Kim on 01/03/2020 07:48:52 Colton Lamb ([id number]) -------------------------------------------------------------------------------- Pain Assessment Details Patient Name: Colton Lamb, Colton Lamb. Date of Service: 12/18/2019 12:30 PM Medical Record Number: [id number] Patient Account Number: [id number] Date of Birth/Sex: Jul 30, 1951 (68 y.o. M) Treating RN: Army Melia Primary Care Provider: [name] Other Clinician: Referring Provider: Lovie Macadamia, DAVID Treating Provider/Extender: Tito Dine in Treatment: 2 Active Problems Location of Pain Severity and Description of Pain Patient Has Paino No Site Locations Pain Management and Medication Current Pain Management: Electronic Signature(s) Signed: 12/18/2019 4:56:08 PM By: Army Melia Entered By: Army Melia on 12/18/2019  13:00:02 Colton Lamb ([id number]) -------------------------------------------------------------------------------- Wound Assessment Details Patient Name: Colton Lamb, Colton Lamb. Date of Service: 12/18/2019 12:30 PM Medical Record Number: [id number] Patient Account Number: [id number] Date of Birth/Sex: [birth year] (68 y.o. M) Treating RN: Army Melia Primary Care Provider: [name] Other Clinician: Referring Provider: Lovie Macadamia, DAVID Treating Provider/Extender: Tito Dine in Treatment: 2 Wound Status Wound Number: 2 Primary Pressure Ulcer Etiology: Wound Location: Midline Sacrum Wound Open Wounding Event: Pressure Injury Status: Date Acquired: 06/02/2019 Comorbid Arrhythmia, Congestive Heart Failure, Coronary Artery Weeks Of Treatment: 2 History: Disease, Hypertension, Myocardial Infarction, Crohnos, Clustered Wound: No Type II Diabetes, History of pressure wounds Photos Wound Measurements Length: (cm) 3.9 Width: (cm) 6.5 Depth: (cm) 0.5 Area: (cm) 19.91 Volume: (cm) 9.955 % Reduction in Area: 20.8% % Reduction in Volume: 50.5% Epithelialization: None Wound Description Classification: Category/Stage III Wound Margin: Flat and Intact Exudate Amount: Medium Exudate Type: Serosanguineous Exudate Color: red, brown Foul Odor After Cleansing: No Slough/Fibrino Yes Wound Bed Granulation Amount: Medium (34-66%) Exposed Structure Granulation Quality: Red Fascia Exposed: No Necrotic Amount: Medium (34-66%) Fat Layer (Subcutaneous Tissue) Exposed: Yes Necrotic Quality: Adherent  Slough Tendon Exposed: No Muscle Exposed: No Joint Exposed: No Bone Exposed: No Electronic Signature(s) Signed: 12/18/2019 4:56:08 PM By: Army Melia Entered By: Army Melia on 12/18/2019 13:04:03 Colton Lamb ([id number]) -------------------------------------------------------------------------------- Wound Assessment Details Patient Name: Colton Lamb, Colton Lamb. Date of  Service: 12/18/2019 12:30 PM Medical Record Number: [id number] Patient Account Number: [id number] Date of Birth/Sex: 05/25/1951 (68 y.o. M) Treating RN: Army Melia Primary Care Provider: [name] Other Clinician: Referring Provider: Lovie Macadamia, DAVID Treating Provider/Extender: Tito Dine in Treatment: 2 Wound Status Wound Number: 3 Primary Pressure Ulcer Etiology: Wound Location: Right Gluteus Wound Open Wounding Event: Pressure Injury Status: Date Acquired: 06/02/2019 Comorbid Arrhythmia, Congestive Heart Failure, Coronary Artery Weeks Of Treatment: 2 History: Disease, Hypertension, Myocardial Infarction, Crohnos, Clustered Wound: No Type II Diabetes, History of pressure wounds Photos Wound Measurements Length: (cm) 0.3 Width: (cm) 0.3 Depth: (cm) 0.1 Area: (cm) 0.071 Volume: (cm) 0.007 % Reduction in Area: 88.7% % Reduction in Volume: 88.9% Epithelialization: None Wound Description Classification: Category/Stage II Wound Margin: Flat and Intact Exudate Amount: Medium Exudate Type: Serosanguineous Exudate Color: red, brown Foul Odor After Cleansing: No Slough/Fibrino Yes Wound Bed Granulation Amount: Medium (34-66%) Exposed Structure Granulation Quality: Red Fascia Exposed: No Necrotic Amount: Medium (34-66%) Fat Layer (Subcutaneous Tissue) Exposed: Yes Necrotic Quality: Adherent Slough Tendon Exposed: No Muscle Exposed: No Joint Exposed: No Bone Exposed: No Electronic Signature(s) Signed: 12/18/2019 4:56:08 PM By: Army Melia Entered By: Army Melia on 12/18/2019 13:04:19 Colton Lamb ([id number]) -------------------------------------------------------------------------------- Vitals Details Patient Name: Colton Lamb Date of Service: 12/18/2019 12:30 PM Medical Record Number: [id number] Patient Account Number: [id number] Date of Birth/Sex: 1950-11-16 (68 y.o. M) Treating RN: Army Melia Primary Care Provider: [name] Other Clinician: Referring Provider: Lovie Macadamia, DAVID Treating Provider/Extender: Tito Dine in Treatment: 2 Vital Signs Time Taken: 12:59 Temperature (F): 99.0 Pulse (bpm): 70 Respiratory Rate (breaths/min): 16 Blood Pressure (mmHg): 118/67 Reference Range: 80 - 120 mg / dl Electronic Signature(s) Signed: 12/18/2019 4:56:08 PM By: Army Melia Entered By: Army Melia on 12/18/2019 12:59:49

## 2019-12-19 NOTE — Progress Notes (Signed)
ERIC, NEES ([id number]) Visit Report for 12/18/2019 Debridement Details Patient Name: Colton Lamb, Colton Lamb. Date of Service: 12/18/2019 12:30 PM Medical Record Number: [id number] Patient Account Number: [id number] Date of Birth/Sex: Nov 19, 1950 (68 y.o. M) Treating RN: Cornell Barman Primary Care Provider: [name] Other Clinician: Referring Provider: Lovie Macadamia, DAVID Treating Provider/Extender: Tito Dine in Treatment: 2 Debridement Performed for Wound #2 Midline Sacrum Assessment: Performed By: Physician Ricard Dillon, MD Debridement Type: Debridement Level of Consciousness (Pre- Awake and Alert procedure): Pre-procedure Verification/Time Out Yes - 13:25 Taken: Start Time: 13:25 Pain Control: Lidocaine Total Area Debrided (L x W): 3.9 (cm) x 6.5 (cm) = 25.35 (cm) Tissue and other material Viable, Non-Viable, Slough, Subcutaneous, Biofilm, Slough debrided: Level: Skin/Subcutaneous Tissue Debridement Description: Excisional Instrument: Curette Bleeding: Moderate Hemostasis Achieved: Silver Nitrate End Time: 13:30 Response to Treatment: Procedure was tolerated well Level of Consciousness (Post- Awake and Alert procedure): Post Debridement Measurements of Total Wound Length: (cm) 3.9 Stage: Category/Stage III Width: (cm) 6.5 Depth: (cm) 0.7 Volume: (cm) 13.937 Character of Wound/Ulcer Post Debridement: Stable Post Procedure Diagnosis Same as Pre-procedure Electronic Signature(s) Signed: 12/18/2019 5:06:12 PM By: Linton Ham MD Signed: 12/19/2019 9:48:50 AM By: Gretta Cool, BSN, RN, CWS, Kim RN, BSN Entered By: Linton Ham on 12/18/2019 13:58:30 MCCORMICK, MACON ([id number]) -------------------------------------------------------------------------------- HPI Details Patient Name: Colton Lamb, Colton Lamb. Date of Service: 12/18/2019 12:30 PM Medical Record Number: [id number] Patient Account Number: [id number] Date of Birth/Sex: 1951/07/20 (68 y.o.  M) Treating RN: Cornell Barman Primary Care Provider: [name] Other Clinician: Referring Provider: Lovie Macadamia, DAVID Treating Provider/Extender: Tito Dine in Treatment: 2 History of Present Illness HPI Description: 05/09/2019 upon evaluation today patient's wound bed actually showed signs of significant wound over the sacral area which does reveal bone exposed. The patient is a resident at [facility]. Subsequently he also does have a history of diabetes mellitus type 2, muscle weakness, cognitive communication deficit, a cardiac pacemaker, is on long-term use of anticoagulants including Eliquis as well as a baby aspirin, and has Crohn's disease. With that being said this wound has been present since around mid August when the patient was actually in the hospital and apparently according to reviewed notes and records had issues with pleural effusions and in the end did have a chest tube as well. Subsequently this was again noted at that time and really has not improved significantly since. I do not see any evidence of any x-rays being performed we likely are getting a do that today. Subsequently also have not seen any specific cultures that were done that is also something I am going to obtain today. Especially since there is bone exposed and I can definitely get a sample of this to send for culture. The patient is in agreement with the plan today. Subsequently depending on the results of the culture we would obviously be able to also initiate the appropriate antibiotic regimen which would be beneficial for him as well. No fevers, chills, nausea, vomiting, or diarrhea. 05/31/2019; after the patient was last here he was apparently admitted to hospital with UTI, pneumonia although have not had a chance to look at this discharge summary. With regards to the tests we ordered for his stage IV sacral ulcer last  time the bone biopsy showed acute osteochondral myelitis and the bone culture showed Citrobacter Amalonaticus. Plain x-ray of the sacrum and coccyx showed no fracture or lytic lesion. No osteomyelitis. I cannot see that this  was actually addressed. He was on Rocephin early in October however the Citrobacter is resistant to this. We have been using Dakin's wet-to-dry to the wounds READMISSION 12/04/2019 This is a disabled 69 year old man we actually had in this clinic on 2 visits in October 2020. At that time he came in with a stage IV pressure ulcer. The area was biopsied with the pathology showing acute osteomyelitis in the culture showing Citrobacter. He was sent to infectious disease but I do not know there was any direct follow-up. He was admitted to the hospital several times since then. This included an admission in late February from 09/23/2019 through 10/02/2019 with Pseudomonas sepsis. Bone biopsy and culture showed Proteus and VRE. Initially treated with IV Zosyn and linezolid however he became thrombocytopenic therefore the Zosyn was changed to daptomycin with oral Cipro and Flagyl. He had a CT scan of the pelvis on 09/24/2019 that showed a large presacral decubitus ulcer with progressive bone destruction of the distal sacrum and coccyx with changes consistent with osteomyelitis. He has followed with Dr. Steva Ready of infectious disease. Last seen on 11/26/2019 at that point it was noted that his antibiotics were completed on 10/21/2019. She did not think he required any additional antibiotics and commented that his wound actually looked is good is she is ever seen this. Past medical history includes type 2 diabetes, Crohn's disease, status post diverting colostomy, chronic renal failure, paroxysmal atrial fib on Xarelto, defibrillator, heart failure with reduced ejection fraction with an EF of 25 to 30%. He was at 1 time at [facility] but he is now at [facility] in [place] He  arrived in our clinic with a border foam dressing. Our intake nurse noted yellow-green drainage 12/18/2019; patient at [facility] large pressure ulcer on the lower sacrum. Using silver alginate. Electronic Signature(s) Signed: 12/18/2019 5:06:12 PM By: Linton Ham MD Entered By: Linton Ham on 12/18/2019 13:59:06 Colton Lamb Colton Lamb ([id number]) -------------------------------------------------------------------------------- Physical Exam Details Patient Name: Colton Lamb, Colton Lamb Date of Service: 12/18/2019 12:30 PM Medical Record Number: [id number] Patient Account Number: [id number] Date of Birth/Sex: September 27, 1950 (68 y.o. M) Treating RN: Cornell Barman Primary Care Provider: [name] Other Clinician: Referring Provider: Lovie Macadamia, DAVID Treating Provider/Extender: Ricard Dillon Weeks in Treatment: 2 Notes Wound exam; lower sacrum/coccyx. Nonviable surface with hyper granulation. Extensive debridement with an open curette. Hemostasis with silver nitrate and a pressure dressing. This is likely going to require further debridements. No evidence of surrounding infection Electronic Signature(s) Signed: 12/18/2019 5:06:12 PM By: Linton Ham MD Entered By: Linton Ham on 12/18/2019 13:59:51 Colton Lamb Colton Lamb ([id number]) -------------------------------------------------------------------------------- Physician Orders Details Patient Name: Colton Lamb, Colton Lamb. Date of Service: 12/18/2019 12:30 PM Medical Record Number: [id number] Patient Account Number: [id number] Date of Birth/Sex: 1951/04/06 (68 y.o. M) Treating RN: Cornell Barman Primary Care Provider: [name] Other Clinician: Referring Provider: Lovie Macadamia, DAVID Treating Provider/Extender: Tito Dine in Treatment: 2 Verbal / Phone Orders: No Diagnosis Coding Wound Cleansing Wound #2 Midline Sacrum o Cleanse wound with mild soap and water o May Shower, gently pat wound dry prior to applying new  dressing. Wound #3 Right Gluteus o Cleanse wound with mild soap and water o May Shower, gently pat wound dry prior to applying new dressing. Anesthetic (add to Medication List) Wound #2 Midline Sacrum o Topical Lidocaine 4% cream applied to wound bed prior to debridement (In Clinic Only). Wound #3 Right Gluteus o Topical Lidocaine 4% cream applied to wound bed prior to debridement (In Clinic Only). Skin  Barriers/Peri-Wound Care Wound #2 Midline Sacrum o Skin Prep Wound #3 Right Gluteus o Skin Prep Primary Wound Dressing Wound #2 Midline Sacrum o Silver Alginate Wound #3 Right Gluteus o Silver Alginate Secondary Dressing Wound #2 Midline Sacrum o Boardered Foam Dressing Wound #3 Right Gluteus o Boardered Foam Dressing Dressing Change Frequency Wound #2 Midline Sacrum o Change dressing every other day. - or more often if soiled. Wound #3 Right Gluteus o Change dressing every other day. - or more often if soiled. Follow-up Appointments Wound #2 Midline Sacrum o Return Appointment in 2 weeks. Wound #3 Right Gluteus o Return Appointment in 2 weeks. Off-Loading Wound #2 Midline Sacrum o Roho cushion for wheelchair TRAMPUS, MCQUERRY ([id number]) o Mattress - Air mattress o Turn and reposition every 2 hours Wound #3 Right Gluteus o Roho cushion for wheelchair o Mattress - Air mattress o Turn and reposition every 2 hours Additional Orders / Instructions Wound #2 Midline Sacrum o Increase protein intake. Wound #3 Right Gluteus o Increase protein intake. Electronic Signature(s) Signed: 12/18/2019 5:06:12 PM By: Linton Ham MD Signed: 12/19/2019 9:48:50 AM By: Gretta Cool, BSN, RN, CWS, Kim RN, BSN Entered By: Gretta Cool, BSN, RN, CWS, Kim on 12/18/2019 13:29:24 NOLYN, SWAB ([id number]) -------------------------------------------------------------------------------- Problem List Details Patient Name: Colton Lamb, Colton Lamb. Date of  Service: 12/18/2019 12:30 PM Medical Record Number: [id number] Patient Account Number: [id number] Date of Birth/Sex: Jun 12, 1951 (68 y.o. M) Treating RN: Cornell Barman Primary Care Provider: [name] Other Clinician: Referring Provider: Lovie Macadamia, DAVID Treating Provider/Extender: Tito Dine in Treatment: 2 Active Problems ICD-10 Encounter Code Description Active Date MDM Diagnosis L89.153 Pressure ulcer of sacral region, stage 3 12/04/2019 No Yes M86.68 Other chronic osteomyelitis, other site 12/04/2019 No Yes L89.312 Pressure ulcer of right buttock, stage 2 12/04/2019 No Yes Inactive Problems Resolved Problems Electronic Signature(s) Signed: 12/18/2019 5:06:12 PM By: Linton Ham MD Entered By: Linton Ham on 12/18/2019 13:58:14 Colton Lamb Colton Lamb ([id number]) -------------------------------------------------------------------------------- Progress Note Details Patient Name: Colton Lamb Colton Lamb. Date of Service: 12/18/2019 12:30 PM Medical Record Number: [id number] Patient Account Number: [id number] Date of Birth/Sex: [birth year] (68 y.o. M) Treating RN: Cornell Barman Primary Care Provider: [name] Other Clinician: Referring Provider: Lovie Macadamia, DAVID Treating Provider/Extender: Tito Dine in Treatment: 2 Subjective History of Present Illness (HPI) 05/09/2019 upon evaluation today patient's wound bed actually showed signs of significant wound over the sacral area which does reveal bone exposed. The patient is a resident at [facility]. Subsequently he also does have a history of diabetes mellitus type 2, muscle weakness, cognitive communication deficit, a cardiac pacemaker, is on long-term use of anticoagulants including Eliquis as well as a baby aspirin, and has Crohn's disease. With that being said this wound has been present since around mid August when the patient was  actually in the hospital and apparently according to reviewed notes and records had issues with pleural effusions and in the end did have a chest tube as well. Subsequently this was again noted at that time and really has not improved significantly since. I do not see any evidence of any x-rays being performed we likely are getting a do that today. Subsequently also have not seen any specific cultures that were done that is also something I am going to obtain today. Especially since there is bone exposed and I can definitely get a sample of this to send for culture. The patient is in agreement with the plan today. Subsequently depending  on the results of the culture we would obviously be able to also initiate the appropriate antibiotic regimen which would be beneficial for him as well. No fevers, chills, nausea, vomiting, or diarrhea. 05/31/2019; after the patient was last here he was apparently admitted to hospital with UTI, pneumonia although have not had a chance to look at this discharge summary. With regards to the tests we ordered for his stage IV sacral ulcer last time the bone biopsy showed acute osteochondral myelitis and the bone culture showed Citrobacter Amalonaticus. Plain x-ray of the sacrum and coccyx showed no fracture or lytic lesion. No osteomyelitis. I cannot see that this was actually addressed. He was on Rocephin early in October however the Citrobacter is resistant to this. We have been using Dakin's wet-to-dry to the wounds READMISSION 12/04/2019 This is a disabled 69 year old man we actually had in this clinic on 2 visits in October 2020. At that time he came in with a stage IV pressure ulcer. The area was biopsied with the pathology showing acute osteomyelitis in the culture showing Citrobacter. He was sent to infectious disease but I do not know there was any direct follow-up. He was admitted to the hospital several times since then. This included an admission in  late February from 09/23/2019 through 10/02/2019 with Pseudomonas sepsis. Bone biopsy and culture showed Proteus and VRE. Initially treated with IV Zosyn and linezolid however he became thrombocytopenic therefore the Zosyn was changed to daptomycin with oral Cipro and Flagyl. He had a CT scan of the pelvis on 09/24/2019 that showed a large presacral decubitus ulcer with progressive bone destruction of the distal sacrum and coccyx with changes consistent with osteomyelitis. He has followed with Dr. Steva Ready of infectious disease. Last seen on 11/26/2019 at that point it was noted that his antibiotics were completed on 10/21/2019. She did not think he required any additional antibiotics and commented that his wound actually looked is good is she is ever seen this. Past medical history includes type 2 diabetes, Crohn's disease, status post diverting colostomy, chronic renal failure, paroxysmal atrial fib on Xarelto, defibrillator, heart failure with reduced ejection fraction with an EF of 25 to 30%. He was at 1 time at [facility] but he is now at [facility] in [place] He arrived in our clinic with a border foam dressing. Our intake nurse noted yellow-green drainage 12/18/2019; patient at [facility] large pressure ulcer on the lower sacrum. Using silver alginate. Objective Constitutional Vitals Time Taken: 12:59 PM, Temperature: 99.0 F, Pulse: 70 bpm, Respiratory Rate: 16 breaths/min, Blood Pressure: 118/67 mmHg. Integumentary (Hair, Skin) Wound #2 status is Open. Original cause of wound was Pressure Injury. The wound is located on the Midline Sacrum. The wound measures 3.9cm length x 6.5cm width x 0.5cm depth; 19.91cm^2 area and 9.955cm^3 volume. There is Fat Layer (Subcutaneous Tissue) Exposed exposed. There is a medium amount of serosanguineous drainage noted. The wound margin is flat and intact. There is medium (34-66%) red granulation within the wound bed. There is a medium (34-66%)  amount of necrotic tissue within the wound bed including Adherent Slough. Wound #3 status is Open. Original cause of wound was Pressure Injury. The wound is located on the Right Gluteus. The wound measures 0.3cm length x 0.3cm width x 0.1cm depth; 0.071cm^2 area and 0.007cm^3 volume. There is Fat Layer (Subcutaneous Tissue) Exposed exposed. There is a medium amount of serosanguineous drainage noted. The wound margin is flat and intact. There is medium (34-66%) red granulation within the  wound bed. There is a medium (34-66%) amount of necrotic tissue within the wound bed including Adherent Slough. JETTSON, CRABLE ([id number]) Assessment Active Problems ICD-10 Pressure ulcer of sacral region, stage 3 Other chronic osteomyelitis, other site Pressure ulcer of right buttock, stage 2 Procedures Wound #2 Pre-procedure diagnosis of Wound #2 is a Pressure Ulcer located on the Midline Sacrum . There was a Excisional Skin/Subcutaneous Tissue Debridement with a total area of 25.35 sq cm performed by Ricard Dillon, MD. With the following instrument(s): Curette to remove Viable and Non-Viable tissue/material. Material removed includes Subcutaneous Tissue, Slough, and Biofilm after achieving pain control using Lidocaine. No specimens were taken. A time out was conducted at 13:25, prior to the start of the procedure. A Moderate amount of bleeding was controlled with Silver Nitrate. The procedure was tolerated well. Post Debridement Measurements: 3.9cm length x 6.5cm width x 0.7cm depth; 13.937cm^3 volume. Post debridement Stage noted as Category/Stage III. Character of Wound/Ulcer Post Debridement is stable. Post procedure Diagnosis Wound #2: Same as Pre-Procedure Plan Wound Cleansing: Wound #2 Midline Sacrum: Cleanse wound with mild soap and water May Shower, gently pat wound dry prior to applying new dressing. Wound #3 Right Gluteus: Cleanse wound with mild soap and water May Shower, gently pat  wound dry prior to applying new dressing. Anesthetic (add to Medication List): Wound #2 Midline Sacrum: Topical Lidocaine 4% cream applied to wound bed prior to debridement (In Clinic Only). Wound #3 Right Gluteus: Topical Lidocaine 4% cream applied to wound bed prior to debridement (In Clinic Only). Skin Barriers/Peri-Wound Care: Wound #2 Midline Sacrum: Skin Prep Wound #3 Right Gluteus: Skin Prep Primary Wound Dressing: Wound #2 Midline Sacrum: Silver Alginate Wound #3 Right Gluteus: Silver Alginate Secondary Dressing: Wound #2 Midline Sacrum: Boardered Foam Dressing Wound #3 Right Gluteus: Boardered Foam Dressing Dressing Change Frequency: Wound #2 Midline Sacrum: Change dressing every other day. - or more often if soiled. Wound #3 Right Gluteus: Change dressing every other day. - or more often if soiled. Follow-up Appointments: Wound #2 Midline Sacrum: Return Appointment in 2 weeks. Wound #3 Right Gluteus: Return Appointment in 2 weeks. Off-Loading: Wound #2 Midline Sacrum: Roho cushion for wheelchair KOLDEN, DUPEE ([id number]) Mattress - Air mattress Turn and reposition every 2 hours Wound #3 Right Gluteus: Roho cushion for wheelchair Mattress - Air mattress Turn and reposition every 2 hours Additional Orders / Instructions: Wound #2 Midline Sacrum: Increase protein intake. Wound #3 Right Gluteus: Increase protein intake. 1. I change the primary dressing to silver alginate 2. Continued extensive debridement. 3. No evidence of infection 4. Follow-up 2 weeks Electronic Signature(s) Signed: 12/18/2019 5:06:12 PM By: Linton Ham MD Entered By: Linton Ham on 12/18/2019 14:00:29 Colton Lamb Colton Lamb ([id number]) -------------------------------------------------------------------------------- SuperBill Details Patient Name: JARY, Colton Lamb. Date of Service: 12/18/2019 Medical Record Number: [id number] Patient Account Number: [id number] Date of  Birth/Sex: [birth year] (69 y.o. M) Treating RN: Cornell Barman Primary Care Provider: [name] Other Clinician: Referring Provider: Lovie Macadamia, DAVID Treating Provider/Extender: Tito Dine in Treatment: 2 Diagnosis Coding ICD-10 Codes Code Description L89.153 Pressure ulcer of sacral region, stage 3 M86.68 Other chronic osteomyelitis, other site L89.312 Pressure ulcer of right buttock, stage 2 Facility Procedures CPT4 Code: [id number] Description: 11042 - DEB SUBQ TISSUE 20 SQ CM/< Modifier: Quantity: 1 CPT4 Code: Description: ICD-10 Diagnosis Description L89.153 Pressure ulcer of sacral region, stage 3 M86.68 Other chronic osteomyelitis, other site Modifier: Quantity: CPT4 Code: [id number] Description: 11045 - DEB SUBQ TISS EA ADDL 20CM Modifier: Quantity:  1 CPT4 Code: Description: ICD-10 Diagnosis Description L89.153 Pressure ulcer of sacral region, stage 3 M86.68 Other chronic osteomyelitis, other site Modifier: Quantity: Physician Procedures CPT4 Code: [id number] Description: 11042 - WC PHYS SUBQ TISS 20 SQ CM Modifier: Quantity: 1 CPT4 Code: Description: ICD-10 Diagnosis Description L89.153 Pressure ulcer of sacral region, stage 3 M86.68 Other chronic osteomyelitis, other site Modifier: Quantity: CPT4 Code: [id number] Description: 89791 - WC PHYS SUBQ TISS EA ADDL 20 CM Modifier: Quantity: 1 CPT4 Code: Description: ICD-10 Diagnosis Description L89.153 Pressure ulcer of sacral region, stage 3 M86.68 Other chronic osteomyelitis, other site Modifier: Quantity: Electronic Signature(s) Signed: 12/18/2019 5:06:12 PM By: Linton Ham MD Entered By: Linton Ham on 12/18/2019 14:00:51

## 2019-12-29 ENCOUNTER — Other Ambulatory Visit: Payer: Self-pay | Admitting: Gastroenterology

## 2020-01-01 ENCOUNTER — Ambulatory Visit: Payer: Medicare Other | Admitting: Internal Medicine

## 2020-01-24 ENCOUNTER — Telehealth: Payer: Self-pay

## 2020-01-24 NOTE — Telephone Encounter (Signed)
Left message for patient to remind of missed remote transmission.  

## 2020-02-16 ENCOUNTER — Emergency Department (HOSPITAL_COMMUNITY): Payer: Medicare Other

## 2020-02-16 ENCOUNTER — Other Ambulatory Visit: Payer: Self-pay

## 2020-02-16 ENCOUNTER — Inpatient Hospital Stay (HOSPITAL_COMMUNITY)
Admission: EM | Admit: 2020-02-16 | Discharge: 2020-02-20 | DRG: 291 | Disposition: A | Discharge status: Skilled Nursing Facility | Payer: Medicare Other | Source: Skilled Nursing Facility | Attending: Internal Medicine | Admitting: Internal Medicine

## 2020-02-16 DIAGNOSIS — Z7901 Long term (current) use of anticoagulants: Secondary | ICD-10-CM | POA: Diagnosis not present

## 2020-02-16 DIAGNOSIS — Z91013 Allergy to seafood: Secondary | ICD-10-CM

## 2020-02-16 DIAGNOSIS — R531 Weakness: Secondary | ICD-10-CM | POA: Diagnosis not present

## 2020-02-16 DIAGNOSIS — Z229 Carrier of infectious disease, unspecified: Secondary | ICD-10-CM

## 2020-02-16 DIAGNOSIS — J9601 Acute respiratory failure with hypoxia: Secondary | ICD-10-CM | POA: Diagnosis not present

## 2020-02-16 DIAGNOSIS — IMO0002 Reserved for concepts with insufficient information to code with codable children: Secondary | ICD-10-CM | POA: Diagnosis present

## 2020-02-16 DIAGNOSIS — E440 Moderate protein-calorie malnutrition: Secondary | ICD-10-CM | POA: Diagnosis present

## 2020-02-16 DIAGNOSIS — E119 Type 2 diabetes mellitus without complications: Secondary | ICD-10-CM | POA: Diagnosis present

## 2020-02-16 DIAGNOSIS — I509 Heart failure, unspecified: Secondary | ICD-10-CM | POA: Diagnosis present

## 2020-02-16 DIAGNOSIS — Z20822 Contact with and (suspected) exposure to covid-19: Secondary | ICD-10-CM | POA: Diagnosis present

## 2020-02-16 DIAGNOSIS — N3289 Other specified disorders of bladder: Secondary | ICD-10-CM | POA: Diagnosis present

## 2020-02-16 DIAGNOSIS — D509 Iron deficiency anemia, unspecified: Secondary | ICD-10-CM | POA: Diagnosis present

## 2020-02-16 DIAGNOSIS — Z6828 Body mass index (BMI) 28.0-28.9, adult: Secondary | ICD-10-CM | POA: Diagnosis not present

## 2020-02-16 DIAGNOSIS — Z7401 Bed confinement status: Secondary | ICD-10-CM | POA: Diagnosis not present

## 2020-02-16 DIAGNOSIS — Z66 Do not resuscitate: Secondary | ICD-10-CM | POA: Diagnosis present

## 2020-02-16 DIAGNOSIS — Z79899 Other long term (current) drug therapy: Secondary | ICD-10-CM

## 2020-02-16 DIAGNOSIS — Z803 Family history of malignant neoplasm of breast: Secondary | ICD-10-CM | POA: Diagnosis not present

## 2020-02-16 DIAGNOSIS — Z933 Colostomy status: Secondary | ICD-10-CM

## 2020-02-16 DIAGNOSIS — Z955 Presence of coronary angioplasty implant and graft: Secondary | ICD-10-CM | POA: Diagnosis not present

## 2020-02-16 DIAGNOSIS — Z9981 Dependence on supplemental oxygen: Secondary | ICD-10-CM | POA: Diagnosis not present

## 2020-02-16 DIAGNOSIS — I5043 Acute on chronic combined systolic (congestive) and diastolic (congestive) heart failure: Secondary | ICD-10-CM | POA: Diagnosis present

## 2020-02-16 DIAGNOSIS — Z888 Allergy status to other drugs, medicaments and biological substances status: Secondary | ICD-10-CM

## 2020-02-16 DIAGNOSIS — L89154 Pressure ulcer of sacral region, stage 4: Secondary | ICD-10-CM | POA: Diagnosis present

## 2020-02-16 DIAGNOSIS — Z881 Allergy status to other antibiotic agents status: Secondary | ICD-10-CM

## 2020-02-16 DIAGNOSIS — I252 Old myocardial infarction: Secondary | ICD-10-CM | POA: Diagnosis not present

## 2020-02-16 DIAGNOSIS — R627 Adult failure to thrive: Secondary | ICD-10-CM | POA: Diagnosis present

## 2020-02-16 DIAGNOSIS — I10 Essential (primary) hypertension: Secondary | ICD-10-CM | POA: Diagnosis present

## 2020-02-16 DIAGNOSIS — E1165 Type 2 diabetes mellitus with hyperglycemia: Secondary | ICD-10-CM | POA: Diagnosis not present

## 2020-02-16 DIAGNOSIS — I482 Chronic atrial fibrillation, unspecified: Secondary | ICD-10-CM | POA: Diagnosis not present

## 2020-02-16 DIAGNOSIS — Z87440 Personal history of urinary (tract) infections: Secondary | ICD-10-CM | POA: Diagnosis not present

## 2020-02-16 DIAGNOSIS — I2510 Atherosclerotic heart disease of native coronary artery without angina pectoris: Secondary | ICD-10-CM | POA: Diagnosis present

## 2020-02-16 DIAGNOSIS — T83511D Infection and inflammatory reaction due to indwelling urethral catheter, subsequent encounter: Secondary | ICD-10-CM | POA: Diagnosis not present

## 2020-02-16 DIAGNOSIS — N3001 Acute cystitis with hematuria: Secondary | ICD-10-CM

## 2020-02-16 DIAGNOSIS — I110 Hypertensive heart disease with heart failure: Secondary | ICD-10-CM | POA: Diagnosis not present

## 2020-02-16 DIAGNOSIS — I480 Paroxysmal atrial fibrillation: Secondary | ICD-10-CM | POA: Diagnosis present

## 2020-02-16 DIAGNOSIS — R0600 Dyspnea, unspecified: Secondary | ICD-10-CM

## 2020-02-16 DIAGNOSIS — J9621 Acute and chronic respiratory failure with hypoxia: Secondary | ICD-10-CM | POA: Diagnosis present

## 2020-02-16 DIAGNOSIS — E118 Type 2 diabetes mellitus with unspecified complications: Secondary | ICD-10-CM | POA: Diagnosis not present

## 2020-02-16 DIAGNOSIS — Z515 Encounter for palliative care: Secondary | ICD-10-CM | POA: Diagnosis not present

## 2020-02-16 DIAGNOSIS — Z8249 Family history of ischemic heart disease and other diseases of the circulatory system: Secondary | ICD-10-CM

## 2020-02-16 DIAGNOSIS — I5021 Acute systolic (congestive) heart failure: Secondary | ICD-10-CM | POA: Diagnosis not present

## 2020-02-16 LAB — COMPREHENSIVE METABOLIC PANEL
ALT: 13 U/L (ref 0–44)
AST: 23 U/L (ref 15–41)
Albumin: 2.6 g/dL — ABNORMAL LOW (ref 3.5–5.0)
Alkaline Phosphatase: 192 U/L — ABNORMAL HIGH (ref 38–126)
Anion gap: 10 (ref 5–15)
BUN: 12 mg/dL (ref 8–23)
CO2: 35 mmol/L — ABNORMAL HIGH (ref 22–32)
Calcium: 8.2 mg/dL — ABNORMAL LOW (ref 8.9–10.3)
Chloride: 92 mmol/L — ABNORMAL LOW (ref 98–111)
Creatinine, Ser: 0.9 mg/dL (ref 0.61–1.24)
GFR calc Af Amer: >60 mL/min (ref >60)
GFR calc non Af Amer: >60 mL/min (ref >60)
Glucose, Bld: 130 mg/dL — ABNORMAL HIGH (ref 70–99)
Potassium: 2.9 mmol/L — ABNORMAL LOW (ref 3.5–5.1)
Sodium: 137 mmol/L (ref 135–145)
Total Bilirubin: 0.8 mg/dL (ref 0.3–1.2)
Total Protein: 6.7 g/dL (ref 6.5–8.1)

## 2020-02-16 LAB — URINALYSIS, ROUTINE W REFLEX MICROSCOPIC
Bilirubin Urine: NEGATIVE
Glucose, UA: NEGATIVE mg/dL
Ketones, ur: NEGATIVE mg/dL
Nitrite: NEGATIVE
Protein, ur: 100 mg/dL — AB
RBC / HPF: >50 RBC/hpf — ABNORMAL HIGH (ref 0–5)
Specific Gravity, Urine: 1.018 (ref 1.005–1.030)
WBC, UA: >50 WBC/hpf — ABNORMAL HIGH (ref 0–5)
pH: 7 (ref 5.0–8.0)

## 2020-02-16 LAB — CBC WITH DIFFERENTIAL/PLATELET
Abs Immature Granulocytes: 0.03 10*3/uL (ref 0.00–0.07)
Basophils Absolute: 0.1 10*3/uL (ref 0.0–0.1)
Basophils Relative: 1 %
Eosinophils Absolute: 0.1 10*3/uL (ref 0.0–0.5)
Eosinophils Relative: 1 %
HCT: 32.4 % — ABNORMAL LOW (ref 39.0–52.0)
Hemoglobin: 9.6 g/dL — ABNORMAL LOW (ref 13.0–17.0)
Immature Granulocytes: 0 %
Lymphocytes Relative: 8 %
Lymphs Abs: 0.9 10*3/uL (ref 0.7–4.0)
MCH: 25.6 pg — ABNORMAL LOW (ref 26.0–34.0)
MCHC: 29.6 g/dL — ABNORMAL LOW (ref 30.0–36.0)
MCV: 86.4 fL (ref 80.0–100.0)
Monocytes Absolute: 0.4 10*3/uL (ref 0.1–1.0)
Monocytes Relative: 4 %
Neutro Abs: 9 10*3/uL — ABNORMAL HIGH (ref 1.7–7.7)
Neutrophils Relative %: 86 %
Platelets: 225 10*3/uL (ref 150–400)
RBC: 3.75 MIL/uL — ABNORMAL LOW (ref 4.22–5.81)
RDW: 16.6 % — ABNORMAL HIGH (ref 11.5–15.5)
WBC: 10.4 10*3/uL (ref 4.0–10.5)
nRBC: 0 % (ref 0.0–0.2)

## 2020-02-16 LAB — SARS CORONAVIRUS 2 BY RT PCR (HOSPITAL ORDER, PERFORMED IN ~~LOC~~ HOSPITAL LAB): SARS Coronavirus 2: NEGATIVE

## 2020-02-16 LAB — CBG MONITORING, ED
Glucose-Capillary: 102 mg/dL — ABNORMAL HIGH (ref 70–99)
Glucose-Capillary: 99 mg/dL (ref 70–99)

## 2020-02-16 LAB — BRAIN NATRIURETIC PEPTIDE: B Natriuretic Peptide: 914 pg/mL — ABNORMAL HIGH (ref 0.0–100.0)

## 2020-02-16 MED ORDER — PANTOPRAZOLE SODIUM 40 MG PO TBEC
40.0000 mg | DELAYED_RELEASE_TABLET | Freq: Every day | ORAL | Status: DC
  Administered 2020-02-17 – 2020-02-20 (×4): 40 mg via ORAL
  Filled 2020-02-16 (×5): qty 1

## 2020-02-16 MED ORDER — FERROUS SULFATE 325 (65 FE) MG PO TABS
325.0000 mg | ORAL_TABLET | Freq: Every morning | ORAL | Status: DC
  Administered 2020-02-17 – 2020-02-20 (×4): 325 mg via ORAL
  Filled 2020-02-16 (×4): qty 1

## 2020-02-16 MED ORDER — TAMSULOSIN HCL 0.4 MG PO CAPS
0.4000 mg | ORAL_CAPSULE | Freq: Every day | ORAL | Status: DC
  Administered 2020-02-17 – 2020-02-20 (×4): 0.4 mg via ORAL
  Filled 2020-02-16 (×4): qty 1

## 2020-02-16 MED ORDER — RIVAROXABAN 20 MG PO TABS
20.0000 mg | ORAL_TABLET | Freq: Every day | ORAL | Status: DC
  Administered 2020-02-16 – 2020-02-20 (×5): 20 mg via ORAL
  Filled 2020-02-16 (×5): qty 1

## 2020-02-16 MED ORDER — SODIUM CHLORIDE 0.9 % IV SOLN
250.0000 mL | INTRAVENOUS | Status: DC | PRN
  Administered 2020-02-19: 250 mL via INTRAVENOUS

## 2020-02-16 MED ORDER — LISINOPRIL 10 MG PO TABS
10.0000 mg | ORAL_TABLET | Freq: Every day | ORAL | Status: DC
  Administered 2020-02-16: 10 mg via ORAL
  Filled 2020-02-16: qty 1

## 2020-02-16 MED ORDER — POTASSIUM CHLORIDE CRYS ER 20 MEQ PO TBCR
20.0000 meq | EXTENDED_RELEASE_TABLET | Freq: Once | ORAL | Status: AC
  Administered 2020-02-16: 20 meq via ORAL
  Filled 2020-02-16: qty 1

## 2020-02-16 MED ORDER — SODIUM CHLORIDE 0.9% FLUSH: 3.0000 mL | INTRAVENOUS | Status: DC | PRN

## 2020-02-16 MED ORDER — ONDANSETRON HCL 4 MG/2ML IJ SOLN
4.0000 mg | Freq: Four times a day (QID) | INTRAMUSCULAR | Status: DC | PRN
  Administered 2020-02-18: 4 mg via INTRAVENOUS
  Filled 2020-02-16: qty 2

## 2020-02-16 MED ORDER — ACETAMINOPHEN 325 MG PO TABS: 650.0000 mg | ORAL_TABLET | ORAL | Status: DC | PRN

## 2020-02-16 MED ORDER — POTASSIUM CHLORIDE 10 MEQ/100ML IV SOLN
10.0000 meq | Freq: Once | INTRAVENOUS | Status: AC
  Administered 2020-02-16: 10 meq via INTRAVENOUS
  Filled 2020-02-16: qty 100

## 2020-02-16 MED ORDER — SACCHAROMYCES BOULARDII 250 MG PO CAPS
250.0000 mg | ORAL_CAPSULE | Freq: Three times a day (TID) | ORAL | Status: DC
  Administered 2020-02-16 – 2020-02-20 (×13): 250 mg via ORAL
  Filled 2020-02-16 (×14): qty 1

## 2020-02-16 MED ORDER — FINASTERIDE 5 MG PO TABS
5.0000 mg | ORAL_TABLET | Freq: Every day | ORAL | Status: DC
  Administered 2020-02-17 – 2020-02-20 (×4): 5 mg via ORAL
  Filled 2020-02-16 (×5): qty 1

## 2020-02-16 MED ORDER — FUROSEMIDE 10 MG/ML IJ SOLN
40.0000 mg | Freq: Once | INTRAMUSCULAR | Status: AC
  Administered 2020-02-16: 40 mg via INTRAVENOUS
  Filled 2020-02-16: qty 4

## 2020-02-16 MED ORDER — ASCORBIC ACID 500 MG PO TABS
1000.0000 mg | ORAL_TABLET | Freq: Every morning | ORAL | Status: DC
  Administered 2020-02-17 – 2020-02-20 (×4): 1000 mg via ORAL
  Filled 2020-02-16 (×4): qty 2

## 2020-02-16 MED ORDER — POTASSIUM CHLORIDE 10 MEQ/100ML IV SOLN
10.0000 meq | INTRAVENOUS | Status: AC
  Administered 2020-02-16 (×2): 10 meq via INTRAVENOUS
  Filled 2020-02-16: qty 100

## 2020-02-16 MED ORDER — BALSALAZIDE DISODIUM 750 MG PO CAPS: 750.0000 mg | ORAL_CAPSULE | Freq: Three times a day (TID) | ORAL | Status: DC

## 2020-02-16 MED ORDER — ATORVASTATIN CALCIUM 80 MG PO TABS
80.0000 mg | ORAL_TABLET | Freq: Every day | ORAL | Status: DC
  Administered 2020-02-16 – 2020-02-19 (×4): 80 mg via ORAL
  Filled 2020-02-16 (×4): qty 1

## 2020-02-16 MED ORDER — SODIUM CHLORIDE 0.9% FLUSH
3.0000 mL | Freq: Two times a day (BID) | INTRAVENOUS | Status: DC
  Administered 2020-02-16 – 2020-02-20 (×7): 3 mL via INTRAVENOUS

## 2020-02-16 MED ORDER — IPRATROPIUM-ALBUTEROL 0.5-2.5 (3) MG/3ML IN SOLN: 3.0000 mL | RESPIRATORY_TRACT | Status: DC | PRN

## 2020-02-16 MED ORDER — NYSTATIN 100000 UNIT/GM EX POWD
1.0000 "application " | Freq: Every morning | CUTANEOUS | Status: DC
  Administered 2020-02-17 – 2020-02-20 (×4): 1 via TOPICAL
  Filled 2020-02-16: qty 15

## 2020-02-16 MED ORDER — INSULIN ASPART 100 UNIT/ML ~~LOC~~ SOLN: 0.0000 [IU] | Freq: Three times a day (TID) | SUBCUTANEOUS | Status: DC

## 2020-02-16 MED ORDER — DIGOXIN 125 MCG PO TABS
0.1250 mg | ORAL_TABLET | Freq: Every day | ORAL | Status: DC
  Administered 2020-02-17 – 2020-02-20 (×4): 0.125 mg via ORAL
  Filled 2020-02-16 (×5): qty 1

## 2020-02-16 MED ORDER — PIPERACILLIN-TAZOBACTAM 3.375 G IVPB 30 MIN
3.3750 g | Freq: Once | INTRAVENOUS | Status: AC
  Administered 2020-02-16: 3.375 g via INTRAVENOUS
  Filled 2020-02-16: qty 50

## 2020-02-16 MED ORDER — ALBUTEROL SULFATE (2.5 MG/3ML) 0.083% IN NEBU: 2.5000 mg | INHALATION_SOLUTION | Freq: Four times a day (QID) | RESPIRATORY_TRACT | Status: DC | PRN

## 2020-02-16 MED ORDER — FUROSEMIDE 10 MG/ML IJ SOLN
40.0000 mg | Freq: Two times a day (BID) | INTRAMUSCULAR | Status: DC
  Administered 2020-02-16 – 2020-02-20 (×8): 40 mg via INTRAVENOUS
  Filled 2020-02-16 (×8): qty 4

## 2020-02-16 MED ORDER — HYDRALAZINE HCL 25 MG PO TABS: 25.0000 mg | ORAL_TABLET | Freq: Four times a day (QID) | ORAL | Status: DC | PRN

## 2020-02-16 NOTE — ED Provider Notes (Signed)
[facility] Provider Note   CSN: [id number] Arrival date & time: 02/16/20  1249     History Chief Complaint  Patient presents with  . Shortness of Breath  . Weakness    Colton Lamb is a 69 y.o. male with PMH significant for CHF, paroxysmal atrial fibrillation on Xarelto, HTN, CAD, IDDM, and chronic respiratory failure on 4 L supplemental O2 via [place] at baseline who presents to the ED via EMS for a 2-day history of generalized weakness and progressively worsening shortness of breath. On my examination, patient is tachypneic in 22s. He was noted to be 40 breaths/min on arrival. He states that it is intermittent, has been plaguing him in the past 48 hours. He states that it feels different from his typical shortness of breath symptoms. He has endorsed intermittent fevers and chills subjectively, none documented. He states that he has been eating and drinking well and denies any diminished appetite, abdominal pain, nausea or vomiting, or changes in bowel habits. He has a colostomy bag and states that his stool has been brown, soft. Patient does endorse some chest tightness intermittently, noticed that this morning when he felt short of breath. He is bedbound and has been for 2 years. He states that he has a chronic sacral ulcer that is well-healing and notes that he has been admitted in the hospital for pneumonia and UTI in the past.  Patient has been vaccinated for COVID-19.  HPI     Past Medical History:  Diagnosis Date  . Atrial flutter ([place])    a. s/p Cardioversion 11/22/13, on amiodarone and Xarelto.  . Chronic osteomyelitis ([place]) 06/30/2019   s/p colostomy  . Chronic systolic heart failure ([place])    a. 10/2013 EF 20-25%, grade III DD, RV mildly dilated and sys fx mild/mod reduced;  b. 01/2014 Echo: EF 30-35%, gr3 DD, mod dil LA. c. 07/2019 EF 20-25%  . Coronary artery disease    a. s/p MI 2007/2015;  b. s/p prior PCI to the LAD/LCX/PDA/PL;  c.  2008: s/p Cypher DES to the OM.  Marland Kitchen Crohn's ileocolitis ([place])   . GERD (gastroesophageal reflux disease)   . Hx of adenomatous colonic polyps 11/2003  . Hyperlipidemia   . Hypertension   . Ischemic cardiomyopathy    s. 01/2014 s/p MDT [id number] [name] XT DR single lead AICD.  Marland Kitchen Obesity   . Paroxysmal atrial fibrillation (HCC)    a. CHA2DS2VASc = (CHF, HTN, agex1, DM)  . Sleep apnea   . Syncope    a.  11/2013 in setting of volume depletion and bradycardia due to dig toxicity   . Type II diabetes mellitus [facility])     Patient Active Problem List   Diagnosis Date Noted  . Chest pain 10/26/2019  . Diarrhea 10/11/2019  . COVID-19 virus infection 10/11/2019  . Thrombocytopenia ([place]) 10/11/2019  . Sacral decubitus ulcer, stage IV ([place]) 10/11/2019  . Encounter for competency evaluation   . Acute respiratory distress syndrome (ARDS) due to COVID-19 virus ([place]) 09/29/2019  . Bacteremia due to Pseudomonas 09/29/2019  . Goals of care, counseling/discussion   . Palliative care by specialist   . HCAP (healthcare-associated pneumonia) 09/23/2019  . Colostomy in place [facility]) 09/23/2019  . Bleeding 07/11/2019  . Chronic osteomyelitis ([place]) 06/30/2019  . PICC (peripherally inserted central catheter) flush   . Normocytic anemia 06/21/2019  . Decubitus ulcer of sacral region, unstageable ([place]) 06/19/2019  . Hypotension 06/19/2019  . Anemia   . Acute  respiratory failure ([place]) 06/12/2019  . Chronic systolic CHF (congestive heart failure) ([place])   . Acute blood loss anemia   . Pressure injury of skin 03/17/2019  . Acute renal failure (ARF) ([place])   . Empyema lung ([place])   . Shortness of breath   . Pleural effusion on right   . Sepsis ([place]) 03/04/2019  . Restless legs syndrome (RLS) 06/16/2017  . Hypertension, essential 06/16/2017  . CAD in native artery 04/27/2017  . Hydrocephalus ([place]) 09/08/2016  . Dizziness and giddiness   . Elevated troponin 09/06/2016  . Type II diabetes mellitus ([place])   .  Ischemic cardiomyopathy   . Hyponatremia 07/01/2015  . Diabetes mellitus type 2, uncontrolled, with complications ([place]) 94/76/5465  . 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 OSA (obstructive sleep apnea) 12/20/2013  . Morbid obesity ([place]) 12/20/2013  . AKI (acute kidney injury) - Creatinine improved at d/c 12/14/2013  . Elevated TSH - will need f/u TFTs with PCP in 3-4 weeks 12/14/2013  . Junctional bradycardia - resolved 12/14/2013  . Syncope - due to bradycardia in setting of Digoxin Toxicity 12/11/2013  . Acute on chronic HFrEF (heart failure with reduced ejection fraction) ([place]) 12/06/2013  . At risk for sudden cardiac death - on LifeVest 12-12-13  . Acute on chronic combined systolic and diastolic CHF (congestive heart failure) ([place]) 2013/12/12  . Cardiomyopathy, ischemic 12-12-13  . Atrial flutter ([place]) 11/22/2013  . NSTEMI (non-ST elevated myocardial infarction) ([place]) 11/22/2013  . Long term current use of anticoagulant 10/19/2010  . Hyperlipidemia 05/06/2010  . Edema 05/06/2010  . CAD S/P percutaneous coronary angioplasty - multiple PCIs 11/11/2008  . Atrial fibrillation ([place]) 11/11/2008  . GERD 11/11/2008  . [place] INTESTINE 11/11/2008  . COLONIC POLYPS, HX OF 11/11/2008  . [place] ALLERGY 11/11/2008    Past Surgical History:  Procedure Laterality Date  . ATRIAL FLUTTER ABLATION N/A 04/16/2014   Procedure: ATRIAL FLUTTER ABLATION;  Surgeon: Evans Lance, MD;  Location: [facility] CATH LAB;  Service: Cardiovascular;  Laterality: N/A;  . CARDIAC CATHETERIZATION  10/2013  . CARDIAC DEFIBRILLATOR PLACEMENT  04/16/2014   Medtronic Evira device  . CARDIAC ELECTROPHYSIOLOGY STUDY AND ABLATION  04/16/2014   atrial flutter ablation  . CARDIOVERSION N/A 03/05/2014   Procedure: CARDIOVERSION;  Surgeon: Jolaine Artist, MD;  Location: [facility] ENDOSCOPY;  Service: Cardiovascular;  Laterality: N/A;  . CATARACT EXTRACTION W/PHACO Right  01/04/2017   Procedure: CATARACT EXTRACTION PHACO AND INTRAOCULAR LENS PLACEMENT ([place])  Right Diabetic Complicated;  Surgeon: Leandrew Koyanagi, MD;  Location: [facility];  Service: Ophthalmology;  Laterality: Right;  Diabetic  . CATARACT EXTRACTION W/PHACO Left 02/08/2017   Procedure: CATARACT EXTRACTION PHACO AND INTRAOCULAR LENS PLACEMENT ([place]) left diabetic;  Surgeon: Leandrew Koyanagi, MD;  Location: [facility];  Service: Ophthalmology;  Laterality: Left;  Diabetic - oral meds sleep apnea  . CORONARY ANGIOPLASTY WITH STENT PLACEMENT  2007; 2008 X 2   "1+1 ~ 1"  . FOOT SURGERY Left    bone spur  . HYDROCELE EXCISION Bilateral   . 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 IMPLANTABLE CARDIOVERTER DEFIBRILLATOR IMPLANT N/A 04/16/2014   Procedure: IMPLANTABLE CARDIOVERTER DEFIBRILLATOR IMPLANT;  Surgeon: Evans Lance, MD;  Location[facility] C[facility] LAB;  Service: Cardiovascular;  Laterality: N/A;  . LEFT HEART CATHETERIZATION WITH CORONARY ANGIOGRAM N/A 11/22/2013   Procedure: LEFT HEART CATHETERIZATION WITH CORONARY ANGIOGRAM;  Surgeon: Sinclair Grooms, MD;  Location[facility] CATH LAB;  Service: Cardiovascular;  Laterality: N/A;  . TRANSVERSE LOOP COLOSTOMY N/A 07/03/2019   Procedure: TRANSVERSE LOOP COLOSTOMY;  Surgeon: Ronny Bacon, MD;  Location: [facility] ORS;  Service: General;  Laterality: N/A;       Family History  Problem Relation Age of Onset  . Breast cancer Mother   . Heart disease Father   . Heart attack Father   . Colon cancer Neg Hx     Social History   Tobacco Use  . Smoking status: Never Smoker  . Smokeless tobacco: Never Used  Substance Use Topics  . Alcohol use: No  . Drug use: No    Home Medications Prior to Admission medications   Medication Sig Start Date End Date Taking? Authorizing Provider  acetaminophen (TYLENOL) 325 MG tablet Take 1-2 tablets (325-650 mg total) by mouth every 4 (four) hours as needed for mild  pain. Patient taking differently: Take 650 mg by mouth every 4 (four) hours as needed for mild pain.  04/17/14  Yes [name], NP  Albuterol Sulfate 108 (90 Base) MCG/ACT AEPB Inhale 1 puff into the lungs every 6 (six) hours as needed (shortness of breath).    Yes [provider]  atorvastatin (LIPITOR) 80 MG tablet TAKE 1 TABLET BY MOUTH EVERY DAY Patient taking differently: Take 80 mg by mouth at bedtime.  03/08/19  Yes Gollan, Kathlene November, MD  balsalazide (COLAZAL) 750 MG capsule TAKE 1 CAPSULE (750 MG TOTAL) BY MOUTH 3 (THREE) TIMES DAILY. Patient taking differently: Take 750 mg by mouth 3 (three) times daily.  11/28/18  Yes Ladene Artist, MD  digoxin (LANOXIN) 0.125 MG tablet Take 1 tablet (0.125 mg total) by mouth daily. 11/01/19  Yes Mesner, Corene Cornea, MD  ferrous sulfate 325 (65 FE) MG EC tablet Take 325 mg by mouth every morning.    Yes [provider]  finasteride (PROSCAR) 5 MG tablet Take 1 tablet (5 mg total) by mouth daily. 03/20/19  Yes Wieting, Richard, MD  ipratropium-albuterol (DUONEB) 0.5-2.5 (3) MG/3ML SOLN Take 3 mLs by nebulization every 4 (four) hours as needed (shortness of breath or wheezing).   Yes [provider]  metFORMIN (GLUCOPHAGE) 500 MG tablet Take 250 mg by mouth every morning.  10/29/19  Yes [provider]  midodrine (PROAMATINE) 10 MG tablet Take 1 tablet (10 mg total) by mouth 3 (three) times daily. 04/26/19  Yes Max Sane, MD  Multiple Vitamins-Minerals (DECUBI-VITE PO) Take 1 capsule by mouth daily.    Yes [provider]  nystatin (MYCOSTATIN/NYSTOP) powder Apply 1 application topically every morning. Apply to scrotum   Yes [provider]  omeprazole (PRILOSEC) 20 MG capsule Take 20 mg by mouth daily before breakfast.    Yes [provider]  OVER THE COUNTER MEDICATION Take 1 tablet by mouth in the morning and at bedtime. Calcium carbonate 300 mg/ vitamin d3 800 units plus mag ox/cop/mang/zn   Yes  [provider]  saccharomyces boulardii (FLORASTOR) 250 MG capsule Take 250 mg by mouth 3 (three) times daily with meals.    Yes [provider]  tamsulosin (FLOMAX) 0.4 MG CAPS capsule Take 1 capsule (0.4 mg total) by mouth daily after breakfast. 03/20/19  Yes Wieting, Richard, MD  torsemide (DEMADEX) 10 MG tablet Take 10 mg by mouth every other day.   Yes [provider]  vitamin C (VITAMIN C) 500 MG tablet Take 1 tablet (500 mg total) by mouth 2 (two) times daily. Patient taking differently: Take 1,000 mg  by mouth every morning.  07/09/19  Yes [name], MD  XARELTO 20 MG TABS tablet TAKE 1 TABLET BY MOUTH EVERY DAY WITH LUNCH Patient taking differently: Take 20 mg by mouth daily with supper.  05/17/19  Yes Gollan, Kathlene November, MD  HYDROcodone-acetaminophen (NORCO/VICODIN) 5-325 MG tablet Take 1-2 tablets by mouth every 4 (four) hours as needed for moderate pain. 10/27/19   Shelly Coss, MD  insulin aspart (NOVOLOG) 100 UNIT/ML injection Inject 5 Units into the skin 3 (three) times daily with meals. Patient taking differently: Inject 0-12 Units into the skin 2 (two) times daily. Sliding Scale:  70-200= 0 units, 201-250= 2 units, 251-300= 4 units, 301-350= 6 units, 351-400=8 units, 401-450= 10 units, 451-500= 12 units. 03/19/19   Loletha Grayer, MD  ondansetron (ZOFRAN-ODT) 4 MG disintegrating tablet Take 4 mg by mouth every 8 (eight) hours as needed for nausea or vomiting.  10/27/19   [provider]  ferrous gluconate (FERGON) 324 MG tablet Take 1 tablet (324 mg total) by mouth daily with breakfast. Patient not taking: Reported on 10/31/2019 10/24/19 11/01/19  Wyvonnia Dusky, MD  insulin glargine (LANTUS) 100 UNIT/ML injection Inject 5 Units into the skin daily.  11/01/19  [provider]    Allergies    Iodine, Shrimp [shellfish allergy], and Tetracycline  Review of Systems   Review of Systems  All other systems reviewed and are  negative.   Physical Exam Updated Vital Signs BP (!) 173/101 Comment: O2 @ 4L via [place]  Pulse 84 Comment: O2 @ 4L via [place]  Temp 98.6 F (37 C) (Oral)   Resp (!) 26 Comment: O2 @ 4L via [place]  Ht 5' 11"  (1.803 m)   Wt 91.6 kg   SpO2 100% Comment: O2 @ 4L via [place]  BMI 28.17 kg/m   Physical Exam Vitals and nursing note reviewed. Exam conducted with a chaperone present.  Constitutional:      Appearance: He is ill-appearing.  HENT:     Head: Normocephalic and atraumatic.  Eyes:     General: No scleral icterus.    Conjunctiva/sclera: Conjunctivae normal.  Cardiovascular:     Rate and Rhythm: Normal rate and regular rhythm.     Pulses: Normal pulses.     Heart sounds: Normal heart sounds.  Pulmonary:     Comments: Increased work of breathing.  Tachypneic.  Decreased lung sounds in bases.  No wheezing or stridor.  No obvious rales. Abdominal:     General: Abdomen is flat. There is no distension.     Palpations: Abdomen is soft.     Tenderness: There is no abdominal tenderness. There is no guarding.     Comments: No overlying skin changes.  Colostomy and Foley catheter noted.  Skin:    General: Skin is dry.     Capillary Refill: Capillary refill takes less than 2 seconds.  Neurological:     Mental Status: He is alert.     GCS: GCS eye subscore is 4. GCS verbal subscore is 5. GCS motor subscore is 6.  Psychiatric:        Mood and Affect: Mood normal.        Behavior: Behavior normal.        Thought Content: Thought content normal.         ED Results / Procedures / Treatments   Labs (all labs ordered are listed, but only abnormal results are displayed) Labs Reviewed  CBC WITH DIFFERENTIAL/PLATELET - Abnormal; Notable for the following  components:      Result Value   RBC 3.75 (*)    Hemoglobin 9.6 (*)    HCT 32.4 (*)    MCH 25.6 (*)    MCHC 29.6 (*)    RDW 16.6 (*)    Neutro Abs 9.0 (*)    All other components within normal limits  COMPREHENSIVE METABOLIC PANEL -  Abnormal; Notable for the following components:   Potassium 2.9 (*)    Chloride 92 (*)    CO2 35 (*)    Glucose, Bld 130 (*)    Calcium 8.2 (*)    Albumin 2.6 (*)    Alkaline Phosphatase 192 (*)    All other components within normal limits  BRAIN NATRIURETIC PEPTIDE - Abnormal; Notable for the following components:   B Natriuretic Peptide 914.0 (*)    All other components within normal limits  URINALYSIS, ROUTINE W REFLEX MICROSCOPIC - Abnormal; Notable for the following components:   Color, Urine AMBER (*)    APPearance TURBID (*)    Hgb urine dipstick LARGE (*)    Protein, ur 100 (*)    Leukocytes,Ua LARGE (*)    RBC / HPF >50 (*)    WBC, UA >50 (*)    Bacteria, UA MANY (*)    All other components within normal limits  URINE CULTURE  SARS CORONAVIRUS 2 BY RT PCR (HOSPITAL ORDER, [place] [facility])  MAGNESIUM    EKG None  Radiology DG Chest 2 View  Result Date: 02/16/2020 CLINICAL DATA:  generalized weakness and SOB over last 2 days. EXAM: CHEST - 2 VIEW COMPARISON:  Chest radiograph 10/31/2019 FINDINGS: Stable cardiomediastinal contours with enlarged heart size. Left chest pacer remains in place. There is central vascular congestion. There is haziness of the interstitium concerning for mild edema. New consolidation at the left lung base. There are mild opacities at the right lung base favored to represent atelectasis. No pneumothorax. No acute finding in the visualized skeleton. IMPRESSION: 1. New consolidation at the left lung base likely a combination of atelectasis and moderate-sized pleural effusion. Infection not excluded. 2.  Right basilar atelectasis and probable small effusion. 3. Cardiomegaly with central vascular congestion and haziness of the interstitium concerning for mild edema. Electronically Signed   By: Audie Pinto M.D.   On: 02/16/2020 14:07    Procedures Procedures (including critical care time)  Medications Ordered in  ED Medications  piperacillin-tazobactam (ZOSYN) IVPB 3.375 g (has no administration in time range)  furosemide (LASIX) injection 40 mg (has no administration in time range)  potassium chloride 10 mEq in 100 mL IVPB (has no administration in time range)  potassium chloride SA (KLOR-CON) CR tablet 20 mEq (has no administration in time range)    ED Course  I have reviewed the triage vital signs and the nursing notes.  Pertinent labs & imaging results that were available during my care of the patient were reviewed by me and considered in my medical decision making (see chart for details).  Clinical Course as of Feb 15 1614  Sun Feb 16, 2020  1614 I spoke with Dr. Roosevelt Locks who will see and admit patient.   [GG]    Clinical Course User Index [GG] Corena Herter, PA-C   MDM Rules/Calculators/A&P                          Labs CBC with differential: Anemia to 9.6 hemoglobin, improved from baseline.  CMP:  Hypokalemia to 2.9.  Elevated bicarbonate 130. BNP: 914.0. UA: Greater than 50 WBC and greater than 50 RBC.  Many bacteria.  Turbid appearance. Urine culture: Pending.  DG chest: I personally reviewed obtained which demonstrated moderate sized pleural effusion in left lung.  Patient's laboratory work-up is notable for obvious UTI which would explain patient's weakness.  Patient also has CHF exacerbation with BNP elevated 914.  Suspected that is what is causing patient's increased work of breathing.  Will provide IV diuresis with furosemide here in the ED while also concurrently replacing his hypokalemia to 2.9 with IV and oral potassium replacement.  I spoke with Dr. Roosevelt Locks who will see and admit patient.  Final Clinical Impression(s) / ED Diagnoses Final diagnoses:  Acute on chronic congestive heart failure, unspecified heart failure type ([name])  Acute cystitis with hematuria    Rx / DC Orders ED Discharge Orders    None       Corena Herter, PA-C 02/16/20 1615    Veryl Speak, MD 02/16/20 1921

## 2020-02-16 NOTE — Consult Note (Signed)
[facility] Nurse ostomy consult note Stoma type/location: LLQ end colostomy Stomal assessment/size: pouch intact Peristomal assessment: 1 and 1/4 inch round stoma Treatment options for stomal/peristomal skin: skin barrier ring Output:soft brown stool in a closed end pouch Ostomy pouching: 2pc. 2 and 3/4 inch pouching system.  We will change to a drainable (able to empty) pouch while here. Pouch is [employer], skin barrier is [name]  #2, skin barrier ring is lawson # [id number]. Education provided: None Enrolled patient in [place] program: No. Patient is dependent in ostomy care.  WOC Nurse Consult Note: Reason for Consult:Chronic Stage 4 pressure injury Wound type:Pressure Pressure Injury POA: Yes/No/NA Measurement: 4cm x 0.4cm x 0.2cm Wound TWK:MQKM pink, moist Drainage (amount, consistency, odor) mucus, light yellow Periwound:with evidence of previous wound healing, scar tissue Dressing procedure/placement/frequency: I will provide a mattress replacement and bilateral heel boots for pressure injury prevention. Wound care will be with a antimicrobial nonadherent (xeroform) gauze topped with a dry gauze and ABD pad changed twice daily. Patient is to be turned side to side and time in the supine position minimized.  [facility] nursing team will not follow, but will remain available to this patient, the nursing and medical teams.  Please re-consult if needed. Thanks, Maudie Flakes, MSN, RN, [place], Arther Abbott  Pager# [phone]

## 2020-02-16 NOTE — ED Notes (Signed)
MD with assistance from tech x 2, turned pt, MD assess wound, dressing reapplied. See MD note for sacral wound assessment.

## 2020-02-16 NOTE — H&P (Addendum)
History and Physical    Colton Lamb:[id number] DOB: January 13, 1951 DOA: 02/16/2020  PCP: [name], MD (Confirm with patient/family/NH records and if not entered, this has to be entered at [facility] point of entry) Patient coming from: Nursing home  I have personally briefly reviewed patient's old medical records in [place]  Chief Complaint: [place]  HPI: Colton Lamb is a 69 y.o. male with medical history significant of chronic systolic CHF (LVEF 85-63% 09/2019), paroxysmal atrial fibrillation on Xarelto, HTN, CAD, IDDM, and chronic respiratory failure on 4 L supplemental O2, Chronic sacral wound and chronic indwelling Foley for sacral wounds, chronic bedbound (>3 months at least), who presents to the ED via EMS for a 2-day history of generalized weakness and progressively worsening shortness of breath. He was told by nursing home staff that "fluid are building up in your lungs" and he also noticed increasing leg edema. He started to have dry cough for two days, he denied any fever or chills, and no chest pains. No wheezing. He also admitted that he has been bed bound for >3 months since April now, and feeling weak all the time, but denied any one sided limbs weakness. He has a chronic Foley for sacral wounds. ED Course: Patient was found to be tachypneic, but improved after 40 mg IV Lasix. Blood work, potassium 2.9, bicarb 35, creatinine 0.9, WBC 10.4. Chest x-ray, cardiomegaly, pulmonary congestion, left pleural effusion atelectasis versus pleural effusion versus consolidation/infiltrates. UA showed WBC more than 50 which looks like chronic.  Review of Systems: As per HPI otherwise 10 point review of systems negative.    Past Medical History:  Diagnosis Date  . Atrial flutter ([place])    a. s/p Cardioversion 11/22/13, on amiodarone and Xarelto.  . Chronic osteomyelitis ([place]) 06/30/2019   s/p colostomy  . Chronic systolic heart failure ([place])    a. 10/2013 EF 20-25%, grade  III DD, RV mildly dilated and sys fx mild/mod reduced;  b. 01/2014 Echo: EF 30-35%, gr3 DD, mod dil LA. c. 07/2019 EF 20-25%  . Coronary artery disease    a. s/p MI 2007/2015;  b. s/p prior PCI to the LAD/LCX/PDA/PL;  c. 2008: s/p Cypher DES to the OM.  Marland Kitchen Crohn's ileocolitis ([place])   . GERD (gastroesophageal reflux disease)   . Hx of adenomatous colonic polyps 11/2003  . Hyperlipidemia   . Hypertension   . Ischemic cardiomyopathy    s. 01/2014 s/p MDT [id number] [name] XT DR single lead AICD.  Marland Kitchen Obesity   . Paroxysmal atrial fibrillation (H[facility])    a. CHA2DS2VASc = (CHF, HTN, agex1, DM)  . Sleep apnea   . Syncope    a.  11/2013 in setting of volume depletion and bradycardia due to dig toxicity   . Type II diabetes mellitus ([place])     Past Surgical History:  Procedure Laterality Date  . ATRIAL FLUTTER ABLATION N/A 04/16/2014   Procedure: ATRIAL FLUTTER ABLATION;  Surgeon: Evans Lance, MD;  Location: [facility] CATH LAB;  Service: Cardiovascular;  Laterality: N/A;  . CARDIAC CATHETERIZATION  10/2013  . CARDIAC DEFIBRILLATOR PLACEMENT  04/16/2014   Medtronic Evira device  . CARDIAC ELECTROPHYSIOLOGY STUDY AND ABLATION  04/16/2014   atrial flutter ablation  . CARDIOVERSION N/A 03/05/2014   Procedure: CARDIOVERSION;  Surgeon: Jolaine Artist, MD;  Location: [facility] ENDOSCOPY;  Service: Cardiovascular;  Laterality: N/A;  . CATARACT EXTRACTION W/PHACO Right 01/04/2017   Procedure: CATARACT EXTRACTION PHACO AND INTRAOCULAR LENS PLACEMENT (IOC)  Right  Diabetic Complicated;  Surgeon: Leandrew Koyanagi, MD;  Location: [place];  Service: Ophthalmology;  Laterality: Right;  Diabetic  . CATARACT EXTRACTION W/PHACO Left 02/08/2017   Procedure: CATARACT EXTRACTION PHACO AND INTRAOCULAR LENS PLACEMENT ([place]) left diabetic;  Surgeon: Leandrew Koyanagi, MD;  Location: [place];  Service: Ophthalmology;  Laterality: Left;  Diabetic - oral meds sleep apnea  . CORONARY ANGIOPLASTY WITH STENT PLACEMENT   2007; 2008 X 2   "1+1 ~ 1"  . FOOT SURGERY Left    bone spur  . HYDROCELE EXCISION Bilateral   . 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 IMPLANTABLE CARDIOVERTER DEFIBRILLATOR IMPLANT N/A 04/16/2014   Procedure: IMPLANTABLE CARDIOVERTER DEFIBRILLATOR IMPLANT;  Surgeon: Evans Lance, MD;  Location: [facility] CATH LAB;  Service: Cardiovascular;  Laterality: N/A;  . LEFT HEART CATHETERIZATION WITH CORONARY ANGIOGRAM N/A 11/22/2013   Procedure: LEFT HEART CATHETERIZATION WITH CORONARY ANGIOGRAM;  Surgeon: Sinclair Grooms, MD;  Location: [facility] CATH LAB;  Service: Cardiovascular;  Laterality: N/A;  . TRANSVERSE LOOP COLOSTOMY N/A 07/03/2019   Procedure: TRANSVERSE LOOP COLOSTOMY;  Surgeon: Ronny Bacon, MD;  Location: [facility] ORS;  Service: General;  Laterality: N/A;     reports that he has never smoked. He has never used smokeless tobacco. He reports that he does not drink alcohol and does not use drugs.  Allergies  Allergen Reactions  . Iodine Hives, Shortness Of Breath and Swelling  . Shrimp [Shellfish Allergy] Hives, Shortness Of Breath and Swelling  . Tetracycline Rash    Family History  Problem Relation Age of Onset  . Breast cancer Mother   . Heart disease Father   . Heart attack Father   . Colon cancer Neg Hx      Prior to Admission medications   Medication Sig Start Date End Date Taking? Authorizing Provider  acetaminophen (TYLENOL) 325 MG tablet Take 1-2 tablets (325-650 mg total) by mouth every 4 (four) hours as needed for mild pain. Patient taking differently: Take 650 mg by mouth every 4 (four) hours as needed for mild pain.  04/17/14  Yes [name], NP  Albuterol Sulfate 108 (90 Base) MCG/ACT AEPB Inhale 1 puff into the lungs every 6 (six) hours as needed (shortness of breath).    Yes [provider]  atorvastatin (LIPITOR) 80 MG tablet TAKE 1 TABLET BY MOUTH EVERY DAY Patient taking differently: Take 80 mg by mouth at bedtime.   03/08/19  Yes Gollan, Kathlene November, MD  balsalazide (COLAZAL) 750 MG capsule TAKE 1 CAPSULE (750 MG TOTAL) BY MOUTH 3 (THREE) TIMES DAILY. Patient taking differently: Take 750 mg by mouth 3 (three) times daily.  11/28/18  Yes Ladene Artist, MD  digoxin (LANOXIN) 0.125 MG tablet Take 1 tablet (0.125 mg total) by mouth daily. 11/01/19  Yes Mesner, Corene Cornea, MD  ferrous sulfate 325 (65 FE) MG EC tablet Take 325 mg by mouth every morning.    Yes [provider]  finasteride (PROSCAR) 5 MG tablet Take 1 tablet (5 mg total) by mouth daily. 03/20/19  Yes Wieting, Richard, MD  ipratropium-albuterol (DUONEB) 0.5-2.5 (3) MG/3ML SOLN Take 3 mLs by nebulization every 4 (four) hours as needed (shortness of breath or wheezing).   Yes [provider]  metFORMIN (GLUCOPHAGE) 500 MG tablet Take 250 mg by mouth every morning.  10/29/19  Yes [provider]  midodrine (PROAMATINE) 10 MG tablet Take 1 tablet (10 mg total) by mouth 3 (three) times daily. 04/26/19  Yes Max Sane, MD  Multiple Vitamins-Minerals (DECUBI-VITE PO) Take 1 capsule by mouth daily.    Yes [provider]  nystatin (MYCOSTATIN/NYSTOP) powder Apply 1 application topically every morning. Apply to scrotum   Yes [provider]  omeprazole (PRILOSEC) 20 MG capsule Take 20 mg by mouth daily before breakfast.    Yes [provider]  OVER THE COUNTER MEDICATION Take 1 tablet by mouth in the morning and at bedtime. Calcium carbonate 300 mg/ vitamin d3 800 units plus mag ox/cop/mang/zn   Yes [provider]  saccharomyces boulardii (FLORASTOR) 250 MG capsule Take 250 mg by mouth 3 (three) times daily with meals.    Yes [provider]  tamsulosin (FLOMAX) 0.4 MG CAPS capsule Take 1 capsule (0.4 mg total) by mouth daily after breakfast. 03/20/19  Yes Wieting, Richard, MD  torsemide (DEMADEX) 10 MG tablet Take 10 mg by mouth every other day.   Yes [provider]  vitamin C (VITAMIN C)  500 MG tablet Take 1 tablet (500 mg total) by mouth 2 (two) times daily. Patient taking differently: Take 1,000 mg by mouth every morning.  07/09/19  Yes [name], MD  XARELTO 20 MG TABS tablet TAKE 1 TABLET BY MOUTH EVERY DAY WITH LUNCH Patient taking differently: Take 20 mg by mouth daily with supper.  05/17/19  Yes Gollan, Kathlene November, MD  HYDROcodone-acetaminophen (NORCO/VICODIN) 5-325 MG tablet Take 1-2 tablets by mouth every 4 (four) hours as needed for moderate pain. 10/27/19   Shelly Coss, MD  insulin aspart (NOVOLOG) 100 UNIT/ML injection Inject 5 Units into the skin 3 (three) times daily with meals. Patient taking differently: Inject 0-12 Units into the skin 2 (two) times daily. Sliding Scale:  70-200= 0 units, 201-250= 2 units, 251-300= 4 units, 301-350= 6 units, 351-400=8 units, 401-450= 10 units, 451-500= 12 units. 03/19/19   Loletha Grayer, MD  ondansetron (ZOFRAN-ODT) 4 MG disintegrating tablet Take 4 mg by mouth every 8 (eight) hours as needed for nausea or vomiting.  10/27/19   [provider]  ferrous gluconate (FERGON) 324 MG tablet Take 1 tablet (324 mg total) by mouth daily with breakfast. Patient not taking: Reported on 10/31/2019 10/24/19 11/01/19  Wyvonnia Dusky, MD  insulin glargine (LANTUS) 100 UNIT/ML injection Inject 5 Units into the skin daily.  11/01/19  [provider]    Physical Exam: Vitals:   02/16/20 1250 02/16/20 1257 02/16/20 1258 02/16/20 1303  BP:    (!) 173/101  Pulse:    84  Resp:    (!) 26  Temp:   98.6 F (37 C)   TempSrc:   Oral   SpO2: 97%   100%  Weight:  91.6 kg    Height:  5' 11"  (1.803 m)      Constitutional: NAD, calm, comfortable Vitals:   02/16/20 1250 02/16/20 1257 02/16/20 1258 02/16/20 1303  BP:    (!) 173/101  Pulse:    84  Resp:    (!) 26  Temp:   98.6 F (37 C)   TempSrc:   Oral   SpO2: 97%   100%  Weight:  91.6 kg    Height:  5' 11"  (1.803 m)     Eyes: PERRL, lids and conjunctivae normal ENMT:  Mucous membranes are moist. Posterior pharynx clear of any exudate or lesions.Normal dentition.  Neck: normal, supple, no masses, no thyromegaly. JVD 7-8 CM Respiratory: clear to auscultation bilaterally, no wheezing, fine crackles to the mid field B/L. Normal  respiratory effort. No accessory muscle use.  Cardiovascular: Regular rate and rhythm, no murmurs / rubs / gallops. 2+ extremity edema. 2+ pedal pulses. No carotid bruits.  Abdomen: no tenderness, no masses palpated. No hepatosplenomegaly. Bowel sounds positive.  Musculoskeletal: no clubbing / cyanosis. No joint deformity upper and lower extremities. Good ROM, no contractures. Normal muscle tone.  Skin: no rashes, lesions, ulcers. No induration Neurologic: CN 2-12 grossly intact. Sensation intact, DTR normal. Strength 5/5 in all 4.  Psychiatric: Normal judgment and insight. Alert and oriented x 3. Normal mood.      Labs on Admission: I have personally reviewed following labs and imaging studies  CBC: Recent Labs  Lab 02/16/20 1419  WBC 10.4  NEUTROABS 9.0*  HGB 9.6*  HCT 32.4*  MCV 86.4  PLT 371   Basic Metabolic Panel: Recent Labs  Lab 02/16/20 1419  NA 137  K 2.9*  CL 92*  CO2 35*  GLUCOSE 130*  BUN 12  CREATININE 0.90  CALCIUM 8.2*   GFR: Estimated Creatinine Clearance: 90.9 mL/min (by C-G formula based on SCr of 0.9 mg/dL). Liver Function Tests: Recent Labs  Lab 02/16/20 1419  AST 23  ALT 13  ALKPHOS 192*  BILITOT 0.8  PROT 6.7  ALBUMIN 2.6*   No results for input(s): LIPASE, AMYLASE in the last 168 hours. No results for input(s): AMMONIA in the last 168 hours. Coagulation Profile: No results for input(s): INR, PROTIME in the last 168 hours. Cardiac Enzymes: No results for input(s): CKTOTAL, CKMB, CKMBINDEX, TROPONINI in the last 168 hours. BNP (last 3 results) No results for input(s): PROBNP in the last 8760 hours. HbA1C: No results for input(s): HGBA1C in the last 72 hours. CBG: No results  for input(s): GLUCAP in the last 168 hours. Lipid Profile: No results for input(s): CHOL, HDL, LDLCALC, TRIG, CHOLHDL, LDLDIRECT in the last 72 hours. Thyroid Function Tests: No results for input(s): TSH, T4TOTAL, FREET4, T3FREE, THYROIDAB in the last 72 hours. Anemia Panel: No results for input(s): VITAMINB12, FOLATE, FERRITIN, TIBC, IRON, RETICCTPCT in the last 72 hours. Urine analysis:    Component Value Date/Time   COLORURINE AMBER (A) 02/16/2020 1419   APPEARANCEUR TURBID (A) 02/16/2020 1419   LABSPEC 1.018 02/16/2020 1419   PHURINE 7.0 02/16/2020 1419   GLUCOSEU NEGATIVE 02/16/2020 1419   HGBUR LARGE (A) 02/16/2020 1419   BILIRUBINUR NEGATIVE 02/16/2020 1419   KETONESUR NEGATIVE 02/16/2020 1419   PROTEINUR 100 (A) 02/16/2020 1419   UROBILINOGEN 1.0 11/27/2013 1017   NITRITE NEGATIVE 02/16/2020 1419   LEUKOCYTESUR LARGE (A) 02/16/2020 1419    Radiological Exams on Admission: DG Chest 2 View  Result Date: 02/16/2020 CLINICAL DATA:  generalized weakness and SOB over last 2 days. EXAM: CHEST - 2 VIEW COMPARISON:  Chest radiograph 10/31/2019 FINDINGS: Stable cardiomediastinal contours with enlarged heart size. Left chest pacer remains in place. There is central vascular congestion. There is haziness of the interstitium concerning for mild edema. New consolidation at the left lung base. There are mild opacities at the right lung base favored to represent atelectasis. No pneumothorax. No acute finding in the visualized skeleton. IMPRESSION: 1. New consolidation at the left lung base likely a combination of atelectasis and moderate-sized pleural effusion. Infection not excluded. 2.  Right basilar atelectasis and probable small effusion. 3. Cardiomegaly with central vascular congestion and haziness of the interstitium concerning for mild edema. Electronically Signed   By: Audie Pinto M.D.   On: 02/16/2020 14:07    EKG: Independently reviewed. A.  fib, no acute ST-T  changes  Assessment/Plan Active Problems:   CHF (congestive heart failure) ([place])  (please populate well all problems here in Problem List. (For example, if patient is on BP meds at home and you resume or decide to hold them, it is a problem that needs to be Lamb. Same for CAD, COPD, HLD and so on)  Acute on chronic hypoxic respiratory failure -Likely from acute on chronic systolic CHF decompensation, clinically with signs of fluid overload, JVD, bilateral crackles and edema, x-ray showing pulmonary congestion and pleural effusion. Received 1 dose of Lasix, start Lasix twice daily first dose tonight to enhance diuresis. -Blood pressure somewhat high, resume his home regimen plus as needed BP meds -Repeat echo -Cannot rule out pneumonia at this time, will order a chest CT scan and then decide whether to start antibiotics  UTI versus chronic colonization -Looks like he is doing well with currently more than 50 WBC since 4 months ago, patient has no fever no urinary symptoms, will monitor off antibiotics suspect this is more likely a colonization.  Uncontrolled hypertension -He is on midodrine, which is to be stopped and monitor response. -Start ACEI  Stage III sacral wound -Consult wound care -PT evaluation, patient reported that not being able to sit up or standing up for at least 3 months and feeling very weak.  IDDM -Change to insulin regimen -Consider change to SGLT2 inhibitor as outpatient  Chronic iron deficiency anemia -Continue iron supplement  Hx of Crohn's -No abd pain, no diarrhea, continue home meds.  Chronic A-fib -Rate controlled, continue anti-coagulation.  DVT prophylaxis: Xarelto Code Status: Full Code Family Communication: Sister over phone Disposition Plan: Likely will need 2-3 days for CHF decompensation and PT evaluation Consults called: None Admission status: tele admit   Lequita Halt MD [facility] Pager [phone]  02/16/2020, 4:16 PM

## 2020-02-16 NOTE — ED Notes (Signed)
[phone] house number Colton Lamb sister would like an update please

## 2020-02-16 NOTE — ED Triage Notes (Signed)
Pt here from [facility] for generalized weakness and SOB over last 2 days. Per EMS pt diminished on R side, wears 4L O2 at baseline, SpO2 97% but tachypnic in 47s. Pt has foley from facility. AOx4, VSS.

## 2020-02-17 ENCOUNTER — Inpatient Hospital Stay (HOSPITAL_COMMUNITY): Payer: Medicare Other

## 2020-02-17 DIAGNOSIS — J9601 Acute respiratory failure with hypoxia: Secondary | ICD-10-CM

## 2020-02-17 DIAGNOSIS — Z515 Encounter for palliative care: Secondary | ICD-10-CM

## 2020-02-17 DIAGNOSIS — R531 Weakness: Secondary | ICD-10-CM

## 2020-02-17 DIAGNOSIS — R627 Adult failure to thrive: Secondary | ICD-10-CM

## 2020-02-17 DIAGNOSIS — I509 Heart failure, unspecified: Secondary | ICD-10-CM

## 2020-02-17 DIAGNOSIS — I5021 Acute systolic (congestive) heart failure: Secondary | ICD-10-CM

## 2020-02-17 LAB — BASIC METABOLIC PANEL
Anion gap: 8 (ref 5–15)
BUN: 11 mg/dL (ref 8–23)
CO2: 41 mmol/L — ABNORMAL HIGH (ref 22–32)
Calcium: 8.3 mg/dL — ABNORMAL LOW (ref 8.9–10.3)
Chloride: 92 mmol/L — ABNORMAL LOW (ref 98–111)
Creatinine, Ser: 0.89 mg/dL (ref 0.61–1.24)
GFR calc Af Amer: >60 mL/min (ref >60)
GFR calc non Af Amer: >60 mL/min (ref >60)
Glucose, Bld: 98 mg/dL (ref 70–99)
Potassium: 3 mmol/L — ABNORMAL LOW (ref 3.5–5.1)
Sodium: 141 mmol/L (ref 135–145)

## 2020-02-17 LAB — GLUCOSE, CAPILLARY
Glucose-Capillary: 110 mg/dL — ABNORMAL HIGH (ref 70–99)
Glucose-Capillary: 111 mg/dL — ABNORMAL HIGH (ref 70–99)
Glucose-Capillary: 98 mg/dL (ref 70–99)
Glucose-Capillary: 113 mg/dL — ABNORMAL HIGH (ref 70–99)

## 2020-02-17 LAB — HEMOGLOBIN A1C
Hgb A1c MFr Bld: 5.1 % (ref 4.8–5.6)
Mean Plasma Glucose: 99.67 mg/dL

## 2020-02-17 LAB — CBC WITH DIFFERENTIAL/PLATELET
Abs Immature Granulocytes: 0.03 10*3/uL (ref 0.00–0.07)
Basophils Absolute: 0.1 10*3/uL (ref 0.0–0.1)
Basophils Relative: 1 %
Eosinophils Absolute: 0.2 10*3/uL (ref 0.0–0.5)
Eosinophils Relative: 2 %
HCT: 31.5 % — ABNORMAL LOW (ref 39.0–52.0)
Hemoglobin: 9.4 g/dL — ABNORMAL LOW (ref 13.0–17.0)
Immature Granulocytes: 0 %
Lymphocytes Relative: 11 %
Lymphs Abs: 1 10*3/uL (ref 0.7–4.0)
MCH: 25.5 pg — ABNORMAL LOW (ref 26.0–34.0)
MCHC: 29.8 g/dL — ABNORMAL LOW (ref 30.0–36.0)
MCV: 85.6 fL (ref 80.0–100.0)
Monocytes Absolute: 0.4 10*3/uL (ref 0.1–1.0)
Monocytes Relative: 5 %
Neutro Abs: 6.8 10*3/uL (ref 1.7–7.7)
Neutrophils Relative %: 81 %
Platelets: 234 10*3/uL (ref 150–400)
RBC: 3.68 MIL/uL — ABNORMAL LOW (ref 4.22–5.81)
RDW: 16.6 % — ABNORMAL HIGH (ref 11.5–15.5)
WBC: 8.4 10*3/uL (ref 4.0–10.5)
nRBC: 0 % (ref 0.0–0.2)

## 2020-02-17 LAB — URINE CULTURE: Culture: >=100000 — AB

## 2020-02-17 LAB — ECHOCARDIOGRAM COMPLETE
Calc EF: 39.1 %
Height: 71 in
Single Plane A2C EF: 20.8 %
Single Plane A4C EF: 48.3 %
Weight: 3622.6 oz

## 2020-02-17 LAB — DIGOXIN LEVEL: Digoxin Level: 0.7 ng/mL — ABNORMAL LOW (ref 0.8–2.0)

## 2020-02-17 MED ORDER — POTASSIUM CHLORIDE CRYS ER 20 MEQ PO TBCR
20.0000 meq | EXTENDED_RELEASE_TABLET | Freq: Two times a day (BID) | ORAL | Status: DC
  Administered 2020-02-17 – 2020-02-20 (×7): 20 meq via ORAL
  Filled 2020-02-17 (×7): qty 1

## 2020-02-17 MED ORDER — PERFLUTREN LIPID MICROSPHERE
1.0000 mL | INTRAVENOUS | Status: AC | PRN
  Administered 2020-02-17: 3 mL via INTRAVENOUS
  Filled 2020-02-17: qty 10

## 2020-02-17 MED ORDER — CHLORHEXIDINE GLUCONATE CLOTH 2 % EX PADS
6.0000 | MEDICATED_PAD | Freq: Every day | CUTANEOUS | Status: DC
  Administered 2020-02-17 – 2020-02-20 (×4): 6 via TOPICAL

## 2020-02-17 NOTE — Progress Notes (Signed)
[facility] PROGRESS NOTE    Progress Note  Colton Lamb PCP: [name], MD     Brief Narrative:   Colton Lamb is an 69 y.o. male past medical history significant for chronic systolic heart failure with an EF of 20% in February 2021, paroxysmal atrial fibrillation on Xarelto, essential hypertension, chronic respiratory failure on 4 L of oxygen, chronic sacral decubitus wound with a chronic indwelling Foley catheter, bedbound for more than 3 months presents to the ED with a 2-day history of generalized weakness and progressive worsening shortness of breath.  In the ED chest x-ray showed pulmonary edema with a left pleural effusion with a bicarb of 35, potassium 2.9 with blood cell count 10.4 was started on IV Lasix.  Assessment/Plan:   Acute respiratory failure with hypoxia due to acute on chronic systolic heart failure: Obviously fluid overloaded on physical exam with chest x-ray showing pulmonary edema. BNP greater than 900 (previous BMP on October 26, 2019 was 500) has remained afebrile with no leukocytosis he denies any cough. He does relate orthopnea on admission his white blood cell count was 10, now is trending down probably stress demargination. His weight going back through the chart appears to be lower than on previous admission. Is negative about 3 L continue IV Lasix strict I's and O's and daily weights. Restrict his fluids.  Uncontrolled hypertension: We will not start an ACE inhibitor as his midodrine was just stopped.  Atrial fibrillation, chronic (HCC) Rate controlled continue Xarelto.  Diabetes mellitus type 2, uncontrolled, with complications (HCC) Last A1c A1c of 5.1, blood glucose actually under excellent control.  Palliative care encounter: It is not unreasonable to involve palliative care in a 69 year old with an EF of 50% requiring 4 L of oxygen, bedbound with a sacral decubitus ulcer and  indwelling Foley catheter and osteomyelitis. We will consult palliative care to address goals of care.  Sacral decubitus ulcer, stage IV ([place]) RN Pressure Injury Documentation: Pressure Injury 09/23/19 Buttocks Right Stage 4 - Full thickness tissue loss with exposed bone, tendon or muscle. purple, encircled with red; (Active)  09/23/19 2300  Location: Buttocks  Location Orientation: Right  Staging: Stage 4 - Full thickness tissue loss with exposed bone, tendon or muscle.  Wound Description (Comments): purple, encircled with red;  Present on Admission: Yes     Pressure Injury 09/23/19 Thigh Posterior;Proximal;Right Stage 2 -  Partial thickness loss of dermis presenting as a shallow open injury with a red, pink wound bed without slough. pink (Active)  09/23/19 2300  Location: Thigh  Location Orientation: Posterior;Proximal;Right  Staging: Stage 2 -  Partial thickness loss of dermis presenting as a shallow open injury with a red, pink wound bed without slough.  Wound Description (Comments): pink  Present on Admission: Yes     Pressure Injury 09/23/19 Buttocks Left Stage 2 -  Partial thickness loss of dermis presenting as a shallow open injury with a red, pink wound bed without slough. pink, 3 cm X 2 cm (Active)  09/23/19 2300  Location: Buttocks  Location Orientation: Left  Staging: Stage 2 -  Partial thickness loss of dermis presenting as a shallow open injury with a red, pink wound bed without slough.  Wound Description (Comments): pink, 3 cm X 2 cm  Present on Admission: Yes    Estimated body mass index is 31.58 kg/m as calculated from the following:   Height as of this encounter: 5' 11"  (1.803 m).  Weight as of this encounter: 102.7 kg. Malnutrition Type:      Malnutrition Characteristics:      Nutrition Interventions:       DVT prophylaxis: Xarelto Family Communication:none Status is: Inpatient  Remains inpatient appropriate because:Hemodynamically  unstable   Dispo: The patient is from: Home              Anticipated d/c is to: Home              Anticipated d/c date is: > 3 days              Patient currently is not medically stable to d/c.        Code Status:     Code Status Orders  (From admission, onward)         Start     Ordered   02/16/20 1613  Full code  Continuous        02/16/20 1615        Code Status History    Date Active Date Inactive Code Status Order ID Comments User Context   10/26/2019 1031 10/27/2019 1757 Full Code [id number]  Karmen Bongo, MD ED   10/11/2019 1348 10/24/2019 0006 Full Code [id number]  Ivor Costa, MD ED   09/23/2019 2246 10/02/2019 2208 Full Code [id number]  Cristescu, Linard Millers, MD Inpatient   07/11/2019 1857 07/18/2019 2102 Full Code [id number]  Fredirick Maudlin, MD ED   07/04/2019 1137 07/09/2019 1742 DNR [id number]  Jimmy Footman, NP Inpatient   06/12/2019 0359 07/04/2019 1136 Full Code [id number]  Mansy, Arvella Merles, MD ED   04/21/2019 1857 04/26/2019 2045 Full Code [id number]  Demetrios Loll, MD Inpatient   03/04/2019 2021 03/19/2019 2042 Full Code [id number]  [name], MD Inpatient   09/07/2016 0100 09/09/2016 2001 Full Code [id number]  Hugelmeyer, Ubaldo Glassing, DO Inpatient   04/16/2014 1552 04/17/2014 1512 Full Code [id number]  Evans Lance, MD Inpatient   12/11/2013 2042 12/14/2013 1516 Full Code [id number]  Eileen Stanford, PA-C Inpatient   11/22/2013 1434 11/30/2013 2000 Full Code [id number]  Belva Crome, MD Inpatient   11/22/2013 0916 11/22/2013 1434 Full Code [id number]  Dorothy Spark, MD Inpatient   11/22/2013 0829 11/22/2013 0916 Full Code [id number]  Barrett, Evelene Croon, PA-C Inpatient   Advance Care Planning Activity    Advance Directive Documentation     Most Recent Value  Type of Advance Directive Out of facility DNR (pink MOST or yellow form)  Pre-existing out of facility DNR order (yellow form or pink MOST form) --  "MOST" Form in Place? --        IV Access:    Peripheral  IV   Procedures and diagnostic studies:   DG Chest 2 View  Result Date: Lamb CLINICAL DATA:  generalized weakness and SOB over last 2 days. EXAM: CHEST - 2 VIEW COMPARISON:  Chest radiograph 10/31/2019 FINDINGS: Stable cardiomediastinal contours with enlarged heart size. Left chest pacer remains in place. There is central vascular congestion. There is haziness of the interstitium concerning for mild edema. New consolidation at the left lung base. There are mild opacities at the right lung base favored to represent atelectasis. No pneumothorax. No acute finding in the visualized skeleton. IMPRESSION: 1. New consolidation at the left lung base likely a combination of atelectasis and moderate-sized pleural effusion. Infection not excluded. 2.  Right basilar atelectasis and probable small effusion. 3. Cardiomegaly with central vascular congestion and haziness of the interstitium concerning for mild edema. Electronically Signed  By: Audie Pinto M.D.   On: 02/16/2020 14:07   CT CHEST WO CONTRAST  Result Date: Lamb CLINICAL DATA:  Generalized weakness and shortness of breath for the past 2 days. Bilateral pleural effusions and airspace opacity on chest radiographs earlier today. EXAM: CT CHEST WITHOUT CONTRAST TECHNIQUE: Multidetector CT imaging of the chest was performed following the standard protocol without IV contrast. COMPARISON:  Chest radiographs earlier today and previously. Chest CT dated 03/06/2019 FINDINGS: Cardiovascular: Enlarged heart with right Atheromatous calcifications, including the coronary arteries and aorta. Small pericardial effusion with a maximum thickness of 1.3 cm. Ventricular and right atrial AICD and pacer leads respectively. Mediastinum/Nodes: No enlarged mediastinal or axillary lymph nodes. Thyroid gland, trachea, and esophagus demonstrate no significant findings. Lungs/Pleura: Moderate-sized left pleural effusion and small right pleural effusion. Bilateral  compressive atelectasis, greater on the left. Interval mild patchy tree in bud opacities throughout the anterior portions of the mid and upper right upper lobe. Upper Abdomen: Multiple small calcified gallstones in the gallbladder measuring up to 5 mm in maximum diameter each. No gallbladder wall thickening or pericholecystic fluid. Small amount of free peritoneal fluid in the upper abdomen. Atheromatous arterial calcifications. Musculoskeletal: Thoracic and lower cervical spine degenerative changes and changes of DISH. Pectus carinatum deformity. IMPRESSION: C 1. Interval mild patchy tree in bud opacities throughout the anterior portions of the mid and upper right upper lobe, compatible with infection. 2. Moderate-sized left pleural effusion and small right pleural effusion. 3. Bilateral compressive atelectasis, greater on the left. 4. Small pericardial effusion. 5. Cholelithiasis. 6. Small amount of free peritoneal fluid in the upper abdomen. 7. Cardiomegaly. 8.  Calcific coronary artery and aortic atherosclerosis. Aortic Atherosclerosis (ICD10-I70.0). Electronically Signed   By: Claudie Revering M.D.   On: 02/16/2020 16:56     Medical Consultants:    None.  Anti-Infectives:   He received 1 dose of IV Zosyn in the ED.  Subjective:    TURNER BAILLIE relates his breathing is better, does not feel like talking this morning.  Objective:    Vitals:   02/16/20 1904 02/16/20 2037 02/16/20 2339 02/17/20 0528  BP: (!) 152/99 (!) 152/97 130/87 122/74  Pulse: 81 79 65 (!) 59  Resp: (!) 26 20 20 20   Temp:  98.3 F (36.8 C) 97.6 F (36.4 C) 98 F (36.7 C)  TempSrc:  Oral Oral Oral  SpO2: 98% 99% 99% 94%  Weight:   102.7 kg 102.7 kg  Height:       SpO2: 94 % O2 Flow Rate (L/min): 4 L/min   Intake/Output Summary (Last 24 hours) at 02/17/2020 0716 Last data filed at 02/17/2020 0500 Gross per 24 hour  Intake 240 ml  Output 3400 ml  Net -3160 ml   Filed Weights   02/16/20 1257 02/16/20  2339 02/17/20 0528  Weight: 91.6 kg 102.7 kg 102.7 kg    Exam: General exam: In no acute distress. Respiratory system: Good air movement and clear to auscultation. Cardiovascular system: S1 & S2 heard, RRR. No JVD.  Gastrointestinal system: Abdomen is nondistended, soft and nontender, suprapubic catheter site appears clean Extremities: No pedal edema. Skin: No rashes, lesions or ulcers Psychiatry: Insight appears depressed.   Data Reviewed:    Labs: Basic Metabolic Panel: Recent Labs  Lab 02/16/20 1419 02/17/20 0545  NA 137 141  K 2.9* 3.0*  CL 92* 92*  CO2 35* 41*  GLUCOSE 130* 98  BUN 12 11  CREATININE 0.90 0.89  CALCIUM 8.2* 8.3*  GFR Estimated Creatinine Clearance: 97 mL/min (by C-G formula based on SCr of 0.89 mg/dL). Liver Function Tests: Recent Labs  Lab 02/16/20 1419  AST 23  ALT 13  ALKPHOS 192*  BILITOT 0.8  PROT 6.7  ALBUMIN 2.6*   No results for input(s): LIPASE, AMYLASE in the last 168 hours. No results for input(s): AMMONIA in the last 168 hours. Coagulation profile No results for input(s): INR, PROTIME in the last 168 hours. COVID-19 Labs  No results for input(s): DDIMER, FERRITIN, LDH, CRP in the last 72 hours.  Lab Results  Component Value Date   SARSCOV2NAA NEGATIVE 02/16/2020   SARSCOV2NAA NEGATIVE 10/26/2019   SARSCOV2NAA POSITIVE (A) 09/23/2019   [place] NEGATIVE 07/17/2019    CBC: Recent Labs  Lab 02/16/20 1419 02/17/20 0545  WBC 10.4 8.4  NEUTROABS 9.0* 6.8  HGB 9.6* 9.4*  HCT 32.4* 31.5*  MCV 86.4 85.6  PLT 225 234   Cardiac Enzymes: No results for input(s): CKTOTAL, CKMB, CKMBINDEX, TROPONINI in the last 168 hours. BNP (last 3 results) No results for input(s): PROBNP in the last 8760 hours. CBG: Recent Labs  Lab 02/16/20 1714 02/16/20 1809  GLUCAP 102* 99   D-Dimer: No results for input(s): DDIMER in the last 72 hours. Hgb A1c: Recent Labs    02/17/20 0545  HGBA1C 5.1   Lipid Profile: No results  for input(s): CHOL, HDL, LDLCALC, TRIG, CHOLHDL, LDLDIRECT in the last 72 hours. Thyroid function studies: No results for input(s): TSH, T4TOTAL, T3FREE, THYROIDAB in the last 72 hours.  Invalid input(s): FREET3 Anemia work up: No results for input(s): VITAMINB12, FOLATE, FERRITIN, TIBC, IRON, RETICCTPCT in the last 72 hours. Sepsis Labs: Recent Labs  Lab 02/16/20 1419 02/17/20 0545  WBC 10.4 8.4   Microbiology Recent Results (from the past 240 hour(s))  SARS Coronavirus 2 by RT PCR (hospital order, performed in [facility] hospital lab) Nasopharyngeal Nasopharyngeal Swab     Status: None   Collection Time: 02/16/20  4:54 PM   Specimen: Nasopharyngeal Swab  Result Value Ref Range Status   SARS Coronavirus 2 NEGATIVE NEGATIVE Final    Comment: (NOTE) SARS-CoV-2 target nucleic acids are NOT DETECTED.  The SARS-CoV-2 RNA is generally detectable in upper and lower respiratory specimens during the acute phase of infection. The lowest concentration of SARS-CoV-2 viral copies this assay can detect is 250 copies / mL. A negative result does not preclude SARS-CoV-2 infection and should not be used as the sole basis for treatment or other patient management decisions.  A negative result may occur with improper specimen collection / handling, submission of specimen other than nasopharyngeal swab, presence of viral mutation(s) within the areas targeted by this assay, and inadequate number of viral copies (<250 copies / mL). A negative result must be combined with clinical observations, patient history, and epidemiological information.  Fact Sheet for Patients:   StrictlyIdeas.no  Fact Sheet for Healthcare Providers: BankingDealers.co.za  This test is not yet approved or  cleared by the [place] FDA and has been authorized for detection and/or diagnosis of SARS-CoV-2 by FDA under an Emergency Use Authorization (EUA).  This EUA will  remain in effect (meaning this test can be used) for the duration of the COVID-19 declaration under Section 564(b)(1) of the Act, 21 U.S.C. section 360bbb-3(b)(1), unless the authorization is terminated or revoked sooner.  Performed at [facility], [place] [address] [place], [place] [address]      Medications:    ascorbic acid  1,000 mg  Oral q morning - 10a   atorvastatin  80 mg Oral QHS   balsalazide  750 mg Oral TID   Chlorhexidine Gluconate Cloth  6 each Topical Daily   digoxin  0.125 mg Oral Daily   ferrous sulfate  325 mg Oral q morning - 10a   finasteride  5 mg Oral Daily   furosemide  40 mg Intravenous BID   insulin aspart  0-9 Units Subcutaneous TID WC   lisinopril  10 mg Oral Daily   nystatin  1 application Topical q morning - 10a   pantoprazole  40 mg Oral Daily   rivaroxaban  20 mg Oral Q supper   saccharomyces boulardii  250 mg Oral TID WC   sodium chloride flush  3 mL Intravenous Q12H   tamsulosin  0.4 mg Oral QPC breakfast   Continuous Infusions:  sodium chloride        LOS: 1 day   Charlynne Cousins  [facility]  02/17/2020, 7:16 AM

## 2020-02-17 NOTE — Consult Note (Signed)
[facility] Nurse Consult Note: Reason for Consult: Pressure injuries  Patient seen yesterday for ostomy and PrI.  See note from that encounter and Orders.  [facility] nursing team will not follow, but will remain available to this patient, the nursing and medical teams.  Please re-consult if needed. Thanks, Maudie Flakes, MSN, RN, [place], Arther Abbott  Pager# [phone]

## 2020-02-17 NOTE — Consult Note (Signed)
Consultation Note Date: 02/17/2020   Patient Name: Colton Lamb  DOB: 28-Dec-1950  MRN: [id number]  Age / Sex: 69 y.o., male  PCP: Colton Pitch, MD Referring Physician: Aileen Lamb, Colton Klippel, MD  Reason for Consultation: Establishing goals of care and Psychosocial/spiritual support  HPI/Patient Profile: 68 y.o. male  admitted on 02/16/2020 with    past medical history significant of chronic systolic CHF (LVEF 58-09% 09/2019), paroxysmal atrial fibrillationon Xarelto, HTN, CAD, IDDM, and chronic respiratory failure on 4 L supplemental O2, Chronic sacral wound and chronic indwelling Foley for sacral wounds, chronic bedbound (>3 months at least), who presents to the ED via EMS for a 2-day history of generalized weakness and progressively worsening shortness of breath. Worsening BLE edema  He has had multiple re-hospitalizations.  Albumin 2.6  ED Course: Patient was found to be tachypneic, but improved after 40 mg IV Lasix. Blood work, potassium 2.9, bicarb 35, creatinine 0.9, WBC 10.4. Chest x-ray, cardiomegaly, pulmonary congestion, left pleural effusion atelectasis versus pleural effusion versus consolidation/infiltrates. UA showed WBC more than 50 which looks like chronic.  Patient has been seen by OP palliative medicine/[facility] services in the SNF  Patient is high risk for decompensation secondary to multiple comorbidities  Patient faces ongoing decisions regarding treatment option decisions, advanced directive decisions and anticipatory care needs   Clinical Assessment and Goals of Care:   This NP Colton Lamb reviewed medical records, received report from team, assessed the patient and then meet at the patient's bedside  to discuss diagnosis, prognosis, GOC, EOL wishes disposition and options.   Concept of Palliative Care was introduced as specialized medical care for people and their families  living with serious illness.  If focuses on providing relief from the symptoms and stress of a serious illness.  The goal is to improve quality of life for both the patient and the family.  Created space and opportunity for patient to explore his thoughts and feelings regarding his current medical situation. He is unable to speak to/limited insight the seriousness of his multiple comorbidities and its impact on his long-term poor prognosis.   A  discussion was had today regarding advanced directives.  Concepts specific to code status, artifical feeding and hydration, continued IV antibiotics and rehospitalization was had.  The difference between a aggressive medical intervention path  and a palliative comfort care path for this patient at this time was had.  Values and goals of care important to patient and family were attempted to be elicited.    Questions and concerns addressed.  Patient  encouraged to call with questions or concerns.     PMT will continue to support holistically.    Patient tells me his son/Colton Lamb who lives in [place] is his [place]. I was unable to contact today    SUMMARY OF RECOMMENDATIONS    Code Status/Advance Care Planning:  Full code   No artifical feeding now or in the future   Palliative Prophylaxis:   Delirium Protocol, Frequent Pain Assessment and Oral Care  Additional  Recommendations (Limitations, Scope, Preferences):  Full Scope Treatment  Psycho-social/Spiritual:   Desire for further Chaplaincy support:yes  Prognosis:   Unable to determine  Discharge Planning: To Be Determined      Primary Diagnoses: Present on Admission: . Acute on chronic combined systolic and diastolic CHF (congestive heart failure) ([place]) . Atrial fibrillation, chronic ([place]) . Diabetes mellitus type 2, uncontrolled, with complications ([place]) . Hypertension, essential . Sacral decubitus ulcer, stage IV ([place]) . Acute respiratory failure with hypoxia ([place])   I have  reviewed the medical record, interviewed the patient and family, and examined the patient. The following aspects are pertinent.  Past Medical History:  Diagnosis Date  . Atrial flutter ([place])    a. s/p Cardioversion 11/22/13, on amiodarone and Xarelto.  . Chronic osteomyelitis ([place]) 06/30/2019   s/p colostomy  . Chronic systolic heart failure ([place])    a. 10/2013 EF 20-25%, grade III DD, RV mildly dilated and sys fx mild/mod reduced;  b. 01/2014 Echo: EF 30-35%, gr3 DD, mod dil LA. c. 07/2019 EF 20-25%  . Coronary artery disease    a. s/p MI 2007/2015;  b. s/p prior PCI to the LAD/LCX/PDA/PL;  c. 2008: s/p Cypher DES to the OM.  Marland Kitchen Crohn's ileocolitis ([place])   . GERD (gastroesophageal reflux disease)   . Hx of adenomatous colonic polyps 11/2003  . Hyperlipidemia   . Hypertension   . Ischemic cardiomyopathy    s. 01/2014 s/p MDT [id number] [name] XT DR single lead AICD.  Marland Kitchen Obesity   . Paroxysmal atrial fibrillation ([facility])    a. CHA2DS2VASc = (CHF, HTN, agex1, DM)  . Sleep apnea   . Syncope    a.  11/2013 in setting of volume depletion and bradycardia due to dig toxicity   . Type II diabetes mellitus ([place])    Social History   Socioeconomic History  . Marital status: Widowed    Spouse name: Not on file  . Number of children: 1  . Years of education: Not on file  . Highest education level: Not on file  Occupational History  . Occupation: retired    Fish farm manager: RETIRED  Tobacco Use  . Smoking status: Never Smoker  . Smokeless tobacco: Never Used  Substance and Sexual Activity  . Alcohol use: No  . Drug use: No  . Sexual activity: Never  Other Topics Concern  . Not on file  Social History Narrative  . Not on file   Social Determinants of Health   Financial Resource Strain: Low Risk   . Difficulty of Paying Living Expenses: Not hard at all  Food Insecurity: No Food Insecurity  . Worried About Charity fundraiser in the Last Year: Never true  . Ran Out of Food in the Last Year: Never  true  Transportation Needs: No Transportation Needs  . Lack of Transportation (Medical): No  . Lack of Transportation (Non-Medical): No  Physical Activity: Inactive  . Days of Exercise per Week: 0 days  . Minutes of Exercise per Session: 0 min  Stress:   . Feeling of Stress :   Social Connections: Socially Isolated  . Frequency of Communication with Friends and Family: Never  . Frequency of Social Gatherings with Friends and Family: Never  . Attends Religious Services: Never  . Active Member of Clubs or Organizations: Yes  . Attends Archivist Meetings: 1 to 4 times per year  . Marital Status: Never married   Family History  Problem Relation Age of Onset  .  Breast cancer Mother   . Heart disease Father   . Heart attack Father   . Colon cancer Neg Hx    Scheduled Meds: . ascorbic acid  1,000 mg Oral q morning - 10a  . atorvastatin  80 mg Oral QHS  . balsalazide  750 mg Oral TID  . Chlorhexidine Gluconate Cloth  6 each Topical Daily  . digoxin  0.125 mg Oral Daily  . ferrous sulfate  325 mg Oral q morning - 10a  . finasteride  5 mg Oral Daily  . furosemide  40 mg Intravenous BID  . insulin aspart  0-9 Units Subcutaneous TID WC  . nystatin  1 application Topical q morning - 10a  . pantoprazole  40 mg Oral Daily  . potassium chloride  20 mEq Oral BID  . rivaroxaban  20 mg Oral Q supper  . saccharomyces boulardii  250 mg Oral TID WC  . sodium chloride flush  3 mL Intravenous Q12H  . tamsulosin  0.4 mg Oral QPC breakfast   Continuous Infusions: . sodium chloride     PRN Meds:.sodium chloride, acetaminophen, albuterol, ipratropium-albuterol, ondansetron (ZOFRAN) IV, sodium chloride flush Medications Prior to Admission:  Prior to Admission medications   Medication Sig Start Date End Date Taking? Authorizing Provider  acetaminophen (TYLENOL) 325 MG tablet Take 1-2 tablets (325-650 mg total) by mouth every 4 (four) hours as needed for mild pain. Patient taking  differently: Take 650 mg by mouth every 4 (four) hours as needed for mild pain.  04/17/14  Yes [name], NP  Albuterol Sulfate 108 (90 Base) MCG/ACT AEPB Inhale 1 puff into the lungs every 6 (six) hours as needed (shortness of breath).    Yes [provider]  atorvastatin (LIPITOR) 80 MG tablet TAKE 1 TABLET BY MOUTH EVERY DAY Patient taking differently: Take 80 mg by mouth at bedtime.  03/08/19  Yes Gollan, Kathlene November, MD  balsalazide (COLAZAL) 750 MG capsule TAKE 1 CAPSULE (750 MG TOTAL) BY MOUTH 3 (THREE) TIMES DAILY. Patient taking differently: Take 750 mg by mouth 3 (three) times daily.  11/28/18  Yes Ladene Artist, MD  digoxin (LANOXIN) 0.125 MG tablet Take 1 tablet (0.125 mg total) by mouth daily. 11/01/19  Yes Mesner, Corene Cornea, MD  ferrous sulfate 325 (65 FE) MG EC tablet Take 325 mg by mouth every morning.    Yes [provider]  finasteride (PROSCAR) 5 MG tablet Take 1 tablet (5 mg total) by mouth daily. 03/20/19  Yes Wieting, Richard, MD  ipratropium-albuterol (DUONEB) 0.5-2.5 (3) MG/3ML SOLN Take 3 mLs by nebulization every 4 (four) hours as needed (shortness of breath or wheezing).   Yes [provider]  metFORMIN (GLUCOPHAGE) 500 MG tablet Take 250 mg by mouth every morning.  10/29/19  Yes [provider]  midodrine (PROAMATINE) 10 MG tablet Take 1 tablet (10 mg total) by mouth 3 (three) times daily. 04/26/19  Yes Max Sane, MD  Multiple Vitamins-Minerals (DECUBI-VITE PO) Take 1 capsule by mouth daily.    Yes [provider]  nystatin (MYCOSTATIN/NYSTOP) powder Apply 1 application topically every morning. Apply to scrotum   Yes [provider]  omeprazole (PRILOSEC) 20 MG capsule Take 20 mg by mouth daily before breakfast.    Yes [provider]  OVER THE COUNTER MEDICATION Take 1 tablet by mouth in the morning and at bedtime. Calcium carbonate 300 mg/ vitamin d3 800 units plus mag ox/cop/mang/zn   Yes [provider]   saccharomyces boulardii (  FLORASTOR) 250 MG capsule Take 250 mg by mouth 3 (three) times daily with meals.    Yes [provider]  tamsulosin (FLOMAX) 0.4 MG CAPS capsule Take 1 capsule (0.4 mg total) by mouth daily after breakfast. 03/20/19  Yes Wieting, Richard, MD  torsemide (DEMADEX) 10 MG tablet Take 10 mg by mouth every other day.   Yes [provider]  vitamin C (VITAMIN C) 500 MG tablet Take 1 tablet (500 mg total) by mouth 2 (two) times daily. Patient taking differently: Take 1,000 mg by mouth every morning.  07/09/19  Yes [name], MD  XARELTO 20 MG TABS tablet TAKE 1 TABLET BY MOUTH EVERY DAY WITH LUNCH Patient taking differently: Take 20 mg by mouth daily with supper.  05/17/19  Yes Gollan, Kathlene November, MD  HYDROcodone-acetaminophen (NORCO/VICODIN) 5-325 MG tablet Take 1-2 tablets by mouth every 4 (four) hours as needed for moderate pain. Patient not taking: Reported on 02/16/2020 10/27/19   Shelly Coss, MD  insulin aspart (NOVOLOG) 100 UNIT/ML injection Inject 5 Units into the skin 3 (three) times daily with meals. Patient not taking: Reported on 02/16/2020 03/19/19   Loletha Grayer, MD  ferrous gluconate (FERGON) 324 MG tablet Take 1 tablet (324 mg total) by mouth daily with breakfast. Patient not taking: Reported on 10/31/2019 10/24/19 11/01/19  Wyvonnia Dusky, MD  insulin glargine (LANTUS) 100 UNIT/ML injection Inject 5 Units into the skin daily.  11/01/19  [provider]   Allergies  Allergen Reactions  . Iodine Hives, Shortness Of Breath and Swelling  . Shrimp [Shellfish Allergy] Hives, Shortness Of Breath and Swelling  . Tetracycline Rash   Review of Systems  Neurological: Positive for weakness.    Physical Exam Cardiovascular:     Rate and Rhythm: Normal rate.  Pulmonary:     Effort: Pulmonary effort is normal.  Musculoskeletal:     Comments: Generalized weakness and muscle atrophy  Skin:    General: Skin is warm and dry.    Neurological:     Mental Status: He is alert.     Vital Signs: BP 125/81 (BP Location: Right Arm)   Pulse 75   Temp 98.7 F (37.1 C) (Oral)   Resp 20   Ht 5' 11"  (1.803 m)   Wt 102.7 kg   SpO2 100%   BMI 31.58 kg/m  Pain Scale: 0-10   Pain Score: 0-No pain   SpO2: SpO2: 100 % O2 Device:SpO2: 100 % O2 Flow Rate: .O2 Flow Rate (L/min): 5 L/min  IO: Intake/output summary:   Intake/Output Summary (Last 24 hours) at 02/17/2020 1602 Last data filed at 02/17/2020 1344 Gross per 24 hour  Intake 660 ml  Output 4275 ml  Net -3615 ml    LBM: Last BM Date: 02/17/20 Baseline Weight: Weight: 91.6 kg Most recent weight: Weight: 102.7 kg     Palliative Assessment/Data: 30 % at best   Discussed with Dr Venetia Constable and Hilda Blades CMRN via secure char  Time In: 1450 Time Out: 1600 Time Total: 70 minutes Greater than 50%  of this time was spent counseling and coordinating care related to the above assessment and plan.  Signed by: Colton Lessen, NP   Please contact Palliative Medicine Team phone at [phone] for questions and concerns.  For individual provider: See Shea Evans

## 2020-02-17 NOTE — Progress Notes (Signed)
PT Cancellation Note  Patient Details Name: NEIL BRICKELL MRN: [id number] DOB: 04-29-1951   Cancelled Treatment:    Reason Eval/Treat Not Completed: Patient declined, no reason specified.  Pt just didn't feel like it after being cleaned up by nursing. 02/17/2020  Ginger Carne., PT Acute Rehabilitation Services [phone]  (pager) [phone]  (office)   Tessie Fass Alayjah Boehringer 02/17/2020, 4:35 PM

## 2020-02-17 NOTE — Progress Notes (Signed)
  Echocardiogram 2D Echocardiogram has been performed.  Geoffery Lyons Swaim 02/17/2020, 10:11 AM

## 2020-02-18 DIAGNOSIS — Z66 Do not resuscitate: Secondary | ICD-10-CM

## 2020-02-18 DIAGNOSIS — R531 Weakness: Secondary | ICD-10-CM

## 2020-02-18 DIAGNOSIS — R627 Adult failure to thrive: Secondary | ICD-10-CM

## 2020-02-18 LAB — URINALYSIS, ROUTINE W REFLEX MICROSCOPIC
Bilirubin Urine: NEGATIVE
Glucose, UA: 50 mg/dL — AB
Ketones, ur: NEGATIVE mg/dL
Nitrite: NEGATIVE
Protein, ur: >=300 mg/dL — AB
RBC / HPF: >50 RBC/hpf — ABNORMAL HIGH (ref 0–5)
Specific Gravity, Urine: 1.018 (ref 1.005–1.030)
WBC, UA: >50 WBC/hpf — ABNORMAL HIGH (ref 0–5)
pH: 7 (ref 5.0–8.0)

## 2020-02-18 LAB — GLUCOSE, CAPILLARY
Glucose-Capillary: 79 mg/dL (ref 70–99)
Glucose-Capillary: 105 mg/dL — ABNORMAL HIGH (ref 70–99)
Glucose-Capillary: 119 mg/dL — ABNORMAL HIGH (ref 70–99)
Glucose-Capillary: 109 mg/dL — ABNORMAL HIGH (ref 70–99)

## 2020-02-18 LAB — BASIC METABOLIC PANEL
Anion gap: 11 (ref 5–15)
BUN: 11 mg/dL (ref 8–23)
CO2: 42 mmol/L — ABNORMAL HIGH (ref 22–32)
Calcium: 8.6 mg/dL — ABNORMAL LOW (ref 8.9–10.3)
Chloride: 92 mmol/L — ABNORMAL LOW (ref 98–111)
Creatinine, Ser: 0.96 mg/dL (ref 0.61–1.24)
GFR calc Af Amer: >60 mL/min (ref >60)
GFR calc non Af Amer: >60 mL/min (ref >60)
Glucose, Bld: 93 mg/dL (ref 70–99)
Potassium: 3.7 mmol/L (ref 3.5–5.1)
Sodium: 145 mmol/L (ref 135–145)

## 2020-02-18 MED ORDER — SODIUM CHLORIDE 0.9 % IV SOLN
2.0000 g | INTRAVENOUS | Status: DC
  Administered 2020-02-18 – 2020-02-20 (×3): 2 g via INTRAVENOUS
  Filled 2020-02-18: qty 2
  Filled 2020-02-18: qty 20
  Filled 2020-02-18: qty 2
  Filled 2020-02-18: qty 20

## 2020-02-18 MED ORDER — BALSALAZIDE DISODIUM 750 MG PO CAPS
750.0000 mg | ORAL_CAPSULE | Freq: Three times a day (TID) | ORAL | Status: DC
  Administered 2020-02-18 – 2020-02-20 (×7): 750 mg via ORAL
  Filled 2020-02-18 (×9): qty 1

## 2020-02-18 NOTE — Progress Notes (Signed)
Patient ID: Colton Lamb, male   DOB: 07/31/1951, 69 y.o.   MRN: [id number]  This NP visited patient at the bedside as a follow up to  yesterday's [place].  Continued conversation with Mr. Koegel regarding his current medical situation. Education offered on his multiple comorbidities and the impact on his long-term prognosis. Education offered on concept specific to human mortality and adult failure to thrive,  and the limitations of medical interventions to prolong quality of life when the body does fail to thrive.  Plan of care -DNR/DNI-documented today ( will complete MOST form before discharge) -Open to all other offered and available medical interventions to prolong life -Transition back to SNF when medically stable, investigate possibility of incorporating hospice services/awaiting callback from case manager RN -Patient gives me permission to speak with family/stepson in [name] palliative at facility  Spoke to step-son/ [name] for conversation regarding this patient's current medical situation. He supports decision for DNR/DNI, " he had a DNR documented in the past". Son verbalizes some frustration with the fact that his stepfather does not really open up enough and give him information in order for him to help and support him. It is difficult to be involved living out of state, [name] lives in [place]  Discussed with patient the importance of continued conversation with his  family and his medical providers regarding overall plan of care and treatment options,  ensuring decisions are within the context of the patients values and GOCs.  Questions and concerns addressed   Discussed with Dr Venetia Constable  Total time spent on the unit was 35 minutes  Greater than 50% of the time was spent in counseling and coordination of care  Wadie Lessen NP  Palliative Medicine Team Team Phone # [phone] Pager [phone]

## 2020-02-18 NOTE — Progress Notes (Signed)
[facility] PROGRESS NOTE    Progress Note  Colton Lamb  BMW:[id number] DOB: [birth year] DOA: 02/16/2020 PCP: [name], MD     Brief Narrative:   Colton Lamb is an 70 y.o. male past medical history significant for chronic systolic heart failure with an EF of 20% in February 2021, paroxysmal atrial fibrillation on Xarelto, essential hypertension, chronic respiratory failure on 4 L of oxygen, chronic sacral decubitus wound with a chronic indwelling Foley catheter, bedbound for more than 3 months presents to the ED with a 2-day history of generalized weakness and progressive worsening shortness of breath.  In the ED chest x-ray showed pulmonary edema with a left pleural effusion with a bicarb of 35, potassium 2.9 with blood cell count 10.4 was started on IV Lasix.  Assessment/Plan:   Acute respiratory failure with hypoxia due to acute on chronic systolic heart failure: 2D echo 20% on February 2021 Orthopnea has improved.  Satting 100% on 2 L. He is negative about 5 L, continue IV Lasix. Wean him off oxygen, strict I's and O's Daily weight restrict his fluids. Continue current dose of IV Lasix. Palliative care to meet with the patient for goals of care, he has been bedbound for over 3 months.  Uncontrolled hypertension: He is currently on no antihypertensive medication. Blood pressure is on excellent control.  Atrial fibrillation, chronic (HCC) Rate controlled continue Xarelto.  Diabetes mellitus type 2, uncontrolled, with complications (HCC) Last A1c A1c of 5.1, blood glucose actually under excellent control. Stop checking CBGs.  Unintentional weight loss: He has had about a 40 pound weight loss over the last 6 months. He is bedbound and malnourished. He cannot tell me if he is up-to-date on his malignancy screening.  Palliative care encounter: It is not unreasonable to involve palliative care in a 69 year old with an EF of 50% requiring 4 L of oxygen,  bedbound with a sacral decubitus ulcer and indwelling Foley catheter and osteomyelitis. We will consult palliative care to address goals of care.  Sacral decubitus ulcer, stage IV Colton [facility]): He has a negative light yellow drainage there is evidence of wound healing and scar tissue.  Wound care has been consulted recommended air mattress replacement and bilateral heel boots.  Xeroform gauze with dry and ABD pads to be changed twice daily. RN Pressure Injury Documentation: Pressure Injury 09/23/19 Buttocks Right Stage 4 - Full thickness tissue loss with exposed bone, tendon or muscle. purple, encircled with red; (Active)  09/23/19 2300  Location: Buttocks  Location Orientation: Right  Staging: Stage 4 - Full thickness tissue loss with exposed bone, tendon or muscle.  Wound Description (Comments): purple, encircled with red;  Present on Admission: Yes     Pressure Injury 09/23/19 Thigh Posterior;Proximal;Right Stage 2 -  Partial thickness loss of dermis presenting as a shallow open injury with a red, pink wound bed without slough. pink (Active)  09/23/19 2300  Location: Thigh  Location Orientation: Posterior;Proximal;Right  Staging: Stage 2 -  Partial thickness loss of dermis presenting as a shallow open injury with a red, pink wound bed without slough.  Wound Description (Comments): pink  Present on Admission: Yes     Pressure Injury 09/23/19 Buttocks Left Stage 2 -  Partial thickness loss of dermis presenting as a shallow open injury with a red, pink wound bed without slough. pink, 3 cm X 2 cm (Active)  09/23/19 2300  Location: Buttocks  Location Orientation: Left  Staging: Stage 2 -  Partial thickness loss of dermis presenting  as a shallow open injury with a red, pink wound bed without slough.  Wound Description (Comments): pink, 3 cm X 2 cm  Present on Admission: Yes    Estimated body mass index is 30.41 kg/m as calculated from the following:   Height as of this encounter: 5' 11"  (1.803  m).   Weight as of this encounter: 98.9 kg. DVT prophylaxis: Xarelto Family Communication:none Status is: Inpatient  Remains inpatient appropriate because:Hemodynamically unstable   Dispo: The patient is from: Home              Anticipated d/c is to: Home              Anticipated d/c date is: 2 days              Patient currently is not medically stable to d/c.        Code Status:     Code Status Orders  (From admission, onward)         Start     Ordered   02/16/20 1613  Full code  Continuous        02/16/20 1615        Code Status History    Date Active Date Inactive Code Status Order ID Comments User Context   10/26/2019 1031 10/27/2019 1757 Full Code [id number]  Karmen Bongo, MD ED   10/11/2019 1348 10/24/2019 0006 Full Code [id number]  Ivor Costa, MD ED   09/23/2019 2246 10/02/2019 2208 Full Code [id number]  Cristescu, Linard Millers, MD Inpatient   07/11/2019 1857 07/18/2019 2102 Full Code [id number]  Fredirick Maudlin, MD ED   07/04/2019 1137 07/09/2019 1742 DNR [id number]  Jimmy Footman, NP Inpatient   06/12/2019 0359 07/04/2019 1136 Full Code [id number]  Mansy, Arvella Merles, MD ED   04/21/2019 1857 04/26/2019 2045 Full Code [id number]  Demetrios Loll, MD Inpatient   03/04/2019 2021 03/19/2019 2042 Full Code [id number]  [name], MD Inpatient   09/07/2016 0100 09/09/2016 2001 Full Code [id number]  Hugelmeyer, Ubaldo Glassing, DO Inpatient   04/16/2014 1552 04/17/2014 1512 Full Code [id number]  Evans Lance, MD Inpatient   12/11/2013 2042 12/14/2013 1516 Full Code [id number]  Eileen Stanford, PA-C Inpatient   11/22/2013 1434 11/30/2013 2000 Full Code [id number]  Belva Crome, MD Inpatient   11/22/2013 0916 11/22/2013 1434 Full Code [id number]  Dorothy Spark, MD Inpatient   11/22/2013 0829 11/22/2013 0916 Full Code [id number]  Barrett, Evelene Croon, PA-C Inpatient   Advance Care Planning Activity    Advance Directive Documentation     Most Recent Value  Type of Advance Directive Out of facility DNR  (pink MOST or yellow form)  Pre-existing out of facility DNR order (yellow form or pink MOST form) --  "MOST" Form in Place? --        IV Access:    Peripheral IV   Procedures and diagnostic studies:   DG Chest 2 View  Result Date: 02/16/2020 CLINICAL DATA:  generalized weakness and SOB over last 2 days. EXAM: CHEST - 2 VIEW COMPARISON:  Chest radiograph 10/31/2019 FINDINGS: Stable cardiomediastinal contours with enlarged heart size. Left chest pacer remains in place. There is central vascular congestion. There is haziness of the interstitium concerning for mild edema. New consolidation at the left lung base. There are mild opacities at the right lung base favored to represent atelectasis. No pneumothorax. No acute finding in the visualized skeleton. IMPRESSION: 1. New consolidation at the left lung base likely a combination of atelectasis  and moderate-sized pleural effusion. Infection not excluded. 2.  Right basilar atelectasis and probable small effusion. 3. Cardiomegaly with central vascular congestion and haziness of the interstitium concerning for mild edema. Electronically Signed   By: Audie Pinto M.D.   On: 02/16/2020 14:07   CT CHEST WO CONTRAST  Result Date: 02/16/2020 CLINICAL DATA:  Generalized weakness and shortness of breath for the past 2 days. Bilateral pleural effusions and airspace opacity on chest radiographs earlier today. EXAM: CT CHEST WITHOUT CONTRAST TECHNIQUE: Multidetector CT imaging of the chest was performed following the standard protocol without IV contrast. COMPARISON:  Chest radiographs earlier today and previously. Chest CT dated 03/06/2019 FINDINGS: Cardiovascular: Enlarged heart with right Atheromatous calcifications, including the coronary arteries and aorta. Small pericardial effusion with a maximum thickness of 1.3 cm. Ventricular and right atrial AICD and pacer leads respectively. Mediastinum/Nodes: No enlarged mediastinal or axillary lymph nodes.  Thyroid gland, trachea, and esophagus demonstrate no significant findings. Lungs/Pleura: Moderate-sized left pleural effusion and small right pleural effusion. Bilateral compressive atelectasis, greater on the left. Interval mild patchy tree in bud opacities throughout the anterior portions of the mid and upper right upper lobe. Upper Abdomen: Multiple small calcified gallstones in the gallbladder measuring up to 5 mm in maximum diameter each. No gallbladder wall thickening or pericholecystic fluid. Small amount of free peritoneal fluid in the upper abdomen. Atheromatous arterial calcifications. Musculoskeletal: Thoracic and lower cervical spine degenerative changes and changes of DISH. Pectus carinatum deformity. IMPRESSION: C 1. Interval mild patchy tree in bud opacities throughout the anterior portions of the mid and upper right upper lobe, compatible with infection. 2. Moderate-sized left pleural effusion and small right pleural effusion. 3. Bilateral compressive atelectasis, greater on the left. 4. Small pericardial effusion. 5. Cholelithiasis. 6. Small amount of free peritoneal fluid in the upper abdomen. 7. Cardiomegaly. 8.  Calcific coronary artery and aortic atherosclerosis. Aortic Atherosclerosis (ICD10-I70.0). Electronically Signed   By: Claudie Revering M.D.   On: 02/16/2020 16:56   ECHOCARDIOGRAM COMPLETE  Result Date: 02/17/2020    ECHOCARDIOGRAM REPORT   Patient Name:   Colton Lamb Date of Exam: 02/17/2020 Medical Rec #:  [id number]        Height:       71.0 in Accession #:    [id number]       Weight:       226.4 lb Date of Birth:  August 04, 1950        BSA:          2.223 m Patient Age:    23 years         BP:           116/75 mmHg Patient Gender: M                HR:           67 bpm. Exam Location:  Inpatient Procedure: 2D Echo, Cardiac Doppler, Color Doppler and Intracardiac            Opacification Agent Indications:    CHF-Acute Systolic 001.74 / B44.96  History:        Patient has prior history  of Echocardiogram examinations, most                 recent 09/28/2019. CHF and Cardiomyopathy, CAD, Arrythmias:Atrial                 Fibrillation, Atrial Flutter and non-specific ST changes,  Signs/Symptoms:Syncope and Chest Pain; Risk                 Factors:Hypertension, Diabetes, Dyslipidemia and Non-Smoker.                 GERD. Elevated troponin.  Sonographer:    Vickie Epley RDCS Referring Phys: [id number] Lequita Halt  Sonographer Comments: Image acquisition challenging due to patient body habitus. IMPRESSIONS  1. Left ventricular ejection fraction, by estimation, is 25 to 30%. The left ventricle has severely decreased function. The left ventricle demonstrates regional wall motion abnormalities (see scoring diagram/findings for description). Left ventricular diastolic function could not be evaluated. There is severe hypokinesis of the left ventricular, mid-apical anteroseptal wall. There is hypokinesis of the left ventricular, apical anteroseptal wall, inferoseptal wall, anterior wall and anterolateral wall.  2. Right ventricular systolic function is moderately reduced. The right ventricular size is not well visualized.  3. The mitral valve is grossly normal. Trivial mitral valve regurgitation. No evidence of mitral stenosis.  4. The aortic valve was not well visualized. Aortic valve regurgitation is not visualized. No aortic stenosis is present.  5. The inferior vena cava is dilated in size with <50% respiratory variability, suggesting right atrial pressure of 15 mmHg. Conclusion(s)/Recommendation(s): EF similar to prior. Small pericardial and pleural effusions noted. FINDINGS  Left Ventricle: Left ventricular ejection fraction, by estimation, is 25 to 30%. The left ventricle has severely decreased function. The left ventricle demonstrates regional wall motion abnormalities. Severe hypokinesis of the left ventricular, mid-apical anteroseptal wall. Definity contrast agent was given IV to delineate  the left ventricular endocardial borders. The left ventricular internal cavity size was normal in size. There is no left ventricular hypertrophy. Left ventricular diastolic function could not be evaluated. Right Ventricle: The right ventricular size is not well visualized. Right vetricular wall thickness was not assessed. Right ventricular systolic function is moderately reduced. Left Atrium: Left atrial size was not well visualized. Right Atrium: Right atrial size was not well visualized. Pericardium: A small pericardial effusion is present. The pericardial effusion is circumferential. Mitral Valve: The mitral valve is grossly normal. Mild mitral annular calcification. Trivial mitral valve regurgitation. No evidence of mitral valve stenosis. Tricuspid Valve: The tricuspid valve is grossly normal. Tricuspid valve regurgitation is trivial. No evidence of tricuspid stenosis. Aortic Valve: The aortic valve was not well visualized. Aortic valve regurgitation is not visualized. No aortic stenosis is present. Pulmonic Valve: The pulmonic valve was not well visualized. Pulmonic valve regurgitation is not visualized. Aorta: The aortic root is normal in size and structure. Venous: The inferior vena cava is dilated in size with less than 50% respiratory variability, suggesting right atrial pressure of 15 mmHg. IAS/Shunts: The atrial septum is grossly normal. Additional Comments: There is a small pleural effusion in both left and right lateral regions.  LEFT VENTRICLE PLAX 2D LVOT diam:     2.30 cm LV SV:         60 LV SV Index:   27 LVOT Area:     4.15 cm  LV Volumes (MOD) LV vol d, MOD A2C: 178.0 ml LV vol d, MOD A4C: 203.0 ml LV vol s, MOD A2C: 141.0 ml LV vol s, MOD A4C: 105.0 ml LV SV MOD A2C:     37.0 ml LV SV MOD A4C:     203.0 ml LV SV MOD BP:      77.8 ml RIGHT VENTRICLE TAPSE (M-mode): 1.3 cm LEFT ATRIUM  Index       RIGHT ATRIUM           Index LA Vol (A2C): 59.1 ml 26.59 ml/m RA Area:     13.60 cm LA  Vol (A4C): 42.9 ml 19.30 ml/m RA Volume:   28.90 ml  13.00 ml/m  AORTIC VALVE LVOT Vmax:   81.60 cm/s LVOT Vmean:  54.600 cm/s LVOT VTI:    0.145 m  SHUNTS Systemic VTI:  0.14 m Systemic Diam: 2.30 cm Buford Dresser MD Electronically signed by Buford Dresser MD Signature Date/Time: 02/17/2020/1:36:01 PM    Final      Medical Consultants:    None.  Anti-Infectives:   He received 1 dose of IV Zosyn in the ED.  Subjective:    Colton Lamb relates his breathing is better compared to yesterday.  Objective:    Vitals:   02/18/20 0013 02/18/20 0107 02/18/20 0444 02/18/20 0718  BP:   114/78 (!) 122/94  Pulse:   78 71  Resp:   20 17  Temp:   98.2 F (36.8 C) 97.7 F (36.5 C)  TempSrc:   Oral Oral  SpO2:   100% 100%  Weight: 98.9 kg 98.9 kg    Height:       SpO2: 100 % O2 Flow Rate (L/min): 5 L/min   Intake/Output Summary (Last 24 hours) at 02/18/2020 0816 Last data filed at 02/18/2020 0623 Gross per 24 hour  Intake 1080 ml  Output 2475 ml  Net -1395 ml   Filed Weights   02/17/20 0528 02/18/20 0013 02/18/20 0107  Weight: 102.7 kg 98.9 kg 98.9 kg    Exam: General exam: In no acute distress. Respiratory system: Good air movement and clear to auscultation. Cardiovascular system: S1 & S2 heard, RRR. No JVD. Gastrointestinal system: Abdomen is nondistended, soft and nontender.  Extremities: No pedal edema. Skin: No rashes, lesions or ulcers Psychiatry: Mood and affect are depressed.   Data Reviewed:    Labs: Basic Metabolic Panel: Recent Labs  Lab 02/16/20 1419 02/16/20 1419 02/17/20 0545 02/18/20 0555  NA 137  --  141 145  K 2.9*   < > 3.0* 3.7  CL 92*  --  92* 92*  CO2 35*  --  41* 42*  GLUCOSE 130*  --  98 93  BUN 12  --  11 11  CREATININE 0.90  --  0.89 0.96  CALCIUM 8.2*  --  8.3* 8.6*   < > = values in this interval not displayed.   GFR Estimated Creatinine Clearance: 88.2 mL/min (by C-G formula based on SCr of 0.96  mg/dL). Liver Function Tests: Recent Labs  Lab 02/16/20 1419  AST 23  ALT 13  ALKPHOS 192*  BILITOT 0.8  PROT 6.7  ALBUMIN 2.6*   No results for input(s): LIPASE, AMYLASE in the last 168 hours. No results for input(s): AMMONIA in the last 168 hours. Coagulation profile No results for input(s): INR, PROTIME in the last 168 hours. COVID-19 Labs  No results for input(s): DDIMER, FERRITIN, LDH, CRP in the last 72 hours.  Lab Results  Component Value Date   SARSCOV2NAA NEGATIVE 02/16/2020   SARSCOV2NAA NEGATIVE 10/26/2019   SARSCOV2NAA POSITIVE (A) 09/23/2019   [place] NEGATIVE 07/17/2019    CBC: Recent Labs  Lab 02/16/20 1419 02/17/20 0545  WBC 10.4 8.4  NEUTROABS 9.0* 6.8  HGB 9.6* 9.4*  HCT 32.4* 31.5*  MCV 86.4 85.6  PLT 225 234   Cardiac Enzymes: No results for input(s): CKTOTAL,  CKMB, CKMBINDEX, TROPONINI in the last 168 hours. BNP (last 3 results) No results for input(s): PROBNP in the last 8760 hours. CBG: Recent Labs  Lab 02/17/20 1023 02/17/20 1155 02/17/20 1626 02/17/20 2103 02/18/20 0609  GLUCAP 110* 111* 98 113* 79   D-Dimer: No results for input(s): DDIMER in the last 72 hours. Hgb A1c: Recent Labs    02/17/20 0545  HGBA1C 5.1   Lipid Profile: No results for input(s): CHOL, HDL, LDLCALC, TRIG, CHOLHDL, LDLDIRECT in the last 72 hours. Thyroid function studies: No results for input(s): TSH, T4TOTAL, T3FREE, THYROIDAB in the last 72 hours.  Invalid input(s): FREET3 Anemia work up: No results for input(s): VITAMINB12, FOLATE, FERRITIN, TIBC, IRON, RETICCTPCT in the last 72 hours. Sepsis Labs: Recent Labs  Lab 02/16/20 1419 02/17/20 0545  WBC 10.4 8.4   Microbiology Recent Results (from the past 240 hour(s))  Urine culture     Status: Abnormal   Collection Time: 02/16/20  2:19 PM   Specimen: Urine, Random  Result Value Ref Range Status   Specimen Description URINE, RANDOM  Final   Special Requests   Final    NONE Performed  at [facility], [address], [place], [place] [address]    Culture (A)  Final    >=100,000 COLONIES/mL MULTIPLE SPECIES PRESENT, SUGGEST RECOLLECTION   Report Status 02/17/2020 FINAL  Final  SARS Coronavirus 2 by RT PCR ([facility] order, performed in [facility]) Nasopharyngeal Nasopharyngeal Swab     Status: None   Collection Time: 02/16/20  4:54 PM   Specimen: Nasopharyngeal Swab  Result Value Ref Range Status   SARS Coronavirus 2 NEGATIVE NEGATIVE Final    Comment: (NOTE) SARS-CoV-2 target nucleic acids are NOT DETECTED.  The SARS-CoV-2 RNA is generally detectable in upper and lower respiratory specimens during the acute phase of infection. The lowest concentration of SARS-CoV-2 viral copies this assay can detect is 250 copies / mL. A negative result does not preclude SARS-CoV-2 infection and should not be used as the sole basis for treatment or other patient management decisions.  A negative result may occur with improper specimen collection / handling, submission of specimen other than nasopharyngeal swab, presence of viral mutation(s) within the areas targeted by this assay, and inadequate number of viral copies (<250 copies / mL). A negative result must be combined with clinical observations, patient history, and epidemiological information.  Fact Sheet for Patients:   StrictlyIdeas.no  Fact Sheet for Healthcare Providers: BankingDealers.co.za  This test is not yet approved or  cleared by the [place] FDA and has been authorized for detection and/or diagnosis of SARS-CoV-2 by FDA under an Emergency Use Authorization (EUA).  This EUA will remain in effect (meaning this test can be used) for the duration of the COVID-19 declaration under Section 564(b)(1) of the Act, 21 U.S.C. section 360bbb-3(b)(1), unless the authorization is terminated or revoked sooner.  Performed at [facility], [place] [address], [place], [place] [address]      Medications:    ascorbic acid  1,000 mg Oral q morning - 10a   atorvastatin  80 mg Oral QHS   balsalazide  750 mg Oral TID   Chlorhexidine Gluconate Cloth  6 each Topical Daily   digoxin  0.125 mg Oral Daily   ferrous sulfate  325 mg Oral q morning - 10a   finasteride  5 mg Oral Daily   furosemide  40 mg Intravenous BID   insulin aspart  0-9  Units Subcutaneous TID WC   nystatin  1 application Topical q morning - 10a   pantoprazole  40 mg Oral Daily   potassium chloride  20 mEq Oral BID   rivaroxaban  20 mg Oral Q supper   saccharomyces boulardii  250 mg Oral TID WC   sodium chloride flush  3 mL Intravenous Q12H   tamsulosin  0.4 mg Oral QPC breakfast   Continuous Infusions:  sodium chloride        LOS: 2 days   Charlynne Cousins  [facility]  02/18/2020, 8:16 AM

## 2020-02-18 NOTE — Plan of Care (Signed)

## 2020-02-18 NOTE — Progress Notes (Signed)
Pt c/o not feeling well having abd spasms and chills. He has recently required f/c be flushed due to increased sediment to keep f/c patent. At present urine is straw colored with some sediment in f/c tubing visible. He reports some nausea so he was given Zofran. He had his o2 off o2 sats were 71-80% on room air I reapplied o2 it took 5 lpm/ to normalize his o2 sats. He reports still did not feel well reported would let MD know about his c/o's. MD messaged me back aware of Above no new orders noted.(Dr.Ortiz-Feliz).

## 2020-02-18 NOTE — Progress Notes (Signed)
   02/18/20 0830  Mobility  Activity Refused mobility (Pt declined for unspecified reasons despite education on importance of ambulation/mobility)  Mobility Response RN notified

## 2020-02-18 NOTE — Progress Notes (Signed)
PT Cancellation Note  Patient Details Name: Colton Lamb MRN: [id number] DOB: 10/06/1950   Cancelled Treatment:    Reason Eval/Treat Not Completed: Patient declined, no reason specified.  Pt reports pain in abd is Improved, but still declining PT.  Even offer to sit on side of bed only was declined.  Will try as time and pt allow.   Ramond Dial 02/18/2020, 11:34 AM   Mee Hives, PT MS Acute Rehab Dept. Number: [place] [facility] [place]

## 2020-02-18 NOTE — Plan of Care (Signed)
  Problem: Education: Goal: Knowledge of General Education information will improve Description: Including pain rating scale, medication(s)/side effects and non-pharmacologic comfort measures Outcome: Progressing   Problem: Health Behavior/Discharge Planning: Goal: Ability to manage health-related needs will improve Outcome: Progressing   Problem: Clinical Measurements: Goal: Ability to maintain clinical measurements within normal limits will improve Outcome: Progressing Goal: Will remain free from infection Outcome: Progressing Goal: Diagnostic test results will improve Outcome: Progressing Goal: Respiratory complications will improve Outcome: Progressing Goal: Cardiovascular complication will be avoided Outcome: Progressing   Problem: Activity: Goal: Risk for activity intolerance will decrease Outcome: Progressing   Problem: Nutrition: Goal: Adequate nutrition will be maintained Outcome: Progressing   Problem: Coping: Goal: Level of anxiety will decrease Outcome: Progressing   Problem: Elimination: Goal: Will not experience complications related to bowel motility Outcome: Progressing Goal: Will not experience complications related to urinary retention Outcome: Progressing   Problem: Pain Managment: Goal: General experience of comfort will improve Outcome: Progressing   Problem: Safety: Goal: Ability to remain free from injury will improve Outcome: Progressing   Problem: Skin Integrity: Goal: Risk for impaired skin integrity will decrease Outcome: Not Progressing   Problem: Education: Goal: Ability to demonstrate management of disease process will improve Outcome: Progressing Goal: Ability to verbalize understanding of medication therapies will improve Outcome: Progressing Goal: Individualized Educational Video(s) Outcome: Progressing   Problem: Activity: Goal: Capacity to carry out activities will improve Outcome: Progressing   Problem: Cardiac: Goal:  Ability to achieve and maintain adequate cardiopulmonary perfusion will improve Outcome: Progressing

## 2020-02-19 DIAGNOSIS — I10 Essential (primary) hypertension: Secondary | ICD-10-CM

## 2020-02-19 DIAGNOSIS — E118 Type 2 diabetes mellitus with unspecified complications: Secondary | ICD-10-CM

## 2020-02-19 DIAGNOSIS — I5043 Acute on chronic combined systolic (congestive) and diastolic (congestive) heart failure: Secondary | ICD-10-CM

## 2020-02-19 DIAGNOSIS — I482 Chronic atrial fibrillation, unspecified: Secondary | ICD-10-CM

## 2020-02-19 DIAGNOSIS — L89154 Pressure ulcer of sacral region, stage 4: Secondary | ICD-10-CM

## 2020-02-19 DIAGNOSIS — E1165 Type 2 diabetes mellitus with hyperglycemia: Secondary | ICD-10-CM

## 2020-02-19 LAB — GLUCOSE, CAPILLARY
Glucose-Capillary: 99 mg/dL (ref 70–99)
Glucose-Capillary: 83 mg/dL (ref 70–99)
Glucose-Capillary: 110 mg/dL — ABNORMAL HIGH (ref 70–99)
Glucose-Capillary: 138 mg/dL — ABNORMAL HIGH (ref 70–99)

## 2020-02-19 LAB — CBC
HCT: 29.2 % — ABNORMAL LOW (ref 39.0–52.0)
Hemoglobin: 8.4 g/dL — ABNORMAL LOW (ref 13.0–17.0)
MCH: 25.1 pg — ABNORMAL LOW (ref 26.0–34.0)
MCHC: 28.8 g/dL — ABNORMAL LOW (ref 30.0–36.0)
MCV: 87.4 fL (ref 80.0–100.0)
Platelets: 231 10*3/uL (ref 150–400)
RBC: 3.34 MIL/uL — ABNORMAL LOW (ref 4.22–5.81)
RDW: 16.4 % — ABNORMAL HIGH (ref 11.5–15.5)
WBC: 8.2 10*3/uL (ref 4.0–10.5)
nRBC: 0 % (ref 0.0–0.2)

## 2020-02-19 LAB — BASIC METABOLIC PANEL
Anion gap: 9 (ref 5–15)
BUN: 15 mg/dL (ref 8–23)
CO2: 41 mmol/L — ABNORMAL HIGH (ref 22–32)
Calcium: 8.3 mg/dL — ABNORMAL LOW (ref 8.9–10.3)
Chloride: 94 mmol/L — ABNORMAL LOW (ref 98–111)
Creatinine, Ser: 1.03 mg/dL (ref 0.61–1.24)
GFR calc Af Amer: >60 mL/min (ref >60)
GFR calc non Af Amer: >60 mL/min (ref >60)
Glucose, Bld: 87 mg/dL (ref 70–99)
Potassium: 3.5 mmol/L (ref 3.5–5.1)
Sodium: 144 mmol/L (ref 135–145)

## 2020-02-19 LAB — TSH: TSH: 6.32 u[IU]/mL — ABNORMAL HIGH (ref 0.350–4.500)

## 2020-02-19 LAB — APTT: aPTT: 46 seconds — ABNORMAL HIGH (ref 24–36)

## 2020-02-19 LAB — PROTIME-INR
INR: 2.1 — ABNORMAL HIGH (ref 0.8–1.2)
Prothrombin Time: 23.1 seconds — ABNORMAL HIGH (ref 11.4–15.2)

## 2020-02-19 MED ORDER — SENNOSIDES-DOCUSATE SODIUM 8.6-50 MG PO TABS: 2.0000 | ORAL_TABLET | Freq: Every evening | ORAL | Status: DC | PRN

## 2020-02-19 MED ORDER — OXYCODONE-ACETAMINOPHEN 5-325 MG PO TABS
1.0000 | ORAL_TABLET | Freq: Once | ORAL | Status: AC
  Administered 2020-02-19: 1 via ORAL
  Filled 2020-02-19: qty 1

## 2020-02-19 MED ORDER — OXYBUTYNIN CHLORIDE 5 MG PO TABS
5.0000 mg | ORAL_TABLET | Freq: Once | ORAL | Status: AC
  Administered 2020-02-19: 5 mg via ORAL
  Filled 2020-02-19: qty 1

## 2020-02-19 MED ORDER — POLYETHYLENE GLYCOL 3350 17 G PO PACK: 17.0000 g | PACK | Freq: Every day | ORAL | Status: DC | PRN

## 2020-02-19 NOTE — Progress Notes (Signed)
Patient having complaints of bladder spasms. RN noted blood tinged urine.  Foley care has been completed twice today.  RN irrigated foley with 30cc's in and 30cc's out. No clogging noted.   Patient stated feeling relief after irrigation.  MD aware.

## 2020-02-19 NOTE — Progress Notes (Signed)
PT Cancellation Note  Patient Details Name: Colton Lamb MRN: [id number] DOB: 12/03/1950   Cancelled Treatment:    Reason Eval/Treat Not Completed: Patient declined, no reason specified.  Pt hsa declined PT for three days with mult daily attempts.  Please reorder when pt is ready to participate in therapy.    Ramond Dial 02/19/2020, 2:55 PM   Mee Hives, PT MS Acute Rehab Dept. Number: [place]

## 2020-02-19 NOTE — TOC Initial Note (Signed)
Transition of Care [facility]) - Initial/Assessment Note    Patient Details  Name: Colton Lamb MRN: [id number] Date of Birth: 1951-06-12  Transition of Care [facility]) CM/SW Contact:    Vinie Sill, [place] Phone Number: 02/19/2020, 4:16 PM  Clinical Narrative:                  CSW visit with the patient at bedside. CSW introduced self and explained role. Patient confirmed he was from [facility] and was agreeable to returning there once discharges. CSW inquired why he was not participating with PT. Patient states he was tried and "they not going to just come in here and toss me around like a bag of rice". CSW acknowledge his feeling but encouraged patient to try and work with PT., patient states he would.   Thurmond Butts, MSW, [place] Clinical Social Worker   Expected Discharge Plan: Skilled Nursing Facility Barriers to Discharge: Continued Medical Work up   Patient Goals and CMS Choice        Expected Discharge Plan and Services Expected Discharge Plan: [place] In-house Referral: Clinical Social Work     Living arrangements for the past 2 months: [place]                                      Prior Living Arrangements/Services Living arrangements for the past 2 months: [place] Lives with:: Facility Resident Patient language and need for interpreter reviewed:: No Do you feel safe going back to the place where you live?: Yes      Need for Family Participation in Patient Care: Yes (Comment) Care giver support system in place?: Yes (comment)   Criminal Activity/Legal Involvement Pertinent to Current Situation/Hospitalization: No - Comment as needed  Activities of Daily Living Home Assistive Devices/Equipment: None ADL Screening (condition at time of admission) Patient's cognitive ability adequate to safely complete daily activities?: Yes Is the patient deaf or have difficulty hearing?: No Does the patient have  difficulty seeing, even when wearing glasses/contacts?: No Does the patient have difficulty concentrating, remembering, or making decisions?: No Patient able to express need for assistance with ADLs?: Yes Does the patient have difficulty dressing or bathing?: Yes Independently performs ADLs?: Yes (appropriate for developmental age) Does the patient have difficulty walking or climbing stairs?: Yes Weakness of Legs: Both Weakness of Arms/Hands: None  Permission Sought/Granted Permission sought to share information with : Family Supports Permission granted to share information with : Yes, Verbal Permission Granted  Share Information with NAME: [name]  Permission granted to share info w AGENCY: SNF  Permission granted to share info w Relationship: son  Permission granted to share info w Contact Information: [phone]  Emotional Assessment Appearance:: Appears stated age   Affect (typically observed): Accepting, Appropriate Orientation: : Oriented to Self, Oriented to Place, Oriented to  Time, Oriented to Situation Alcohol / Substance Use: Not Applicable Psych Involvement: No (comment)  Admission diagnosis:  CHF (congestive heart failure) (HCC) [I50.9] Acute cystitis with hematuria [N30.01] Acute on chronic congestive heart failure, unspecified heart failure type [facility]) [I50.9] Patient Active Problem List   Diagnosis Date Noted  . Adult failure to thrive   . Weakness generalized   . DNR (do not resuscitate)   . CHF (congestive heart failure) ([name]) 02/16/2020  . Chest pain 10/26/2019  . Diarrhea 10/11/2019  . COVID-19 virus infection 10/11/2019  . Thrombocytopenia ([place])  10/11/2019  . Sacral decubitus ulcer, stage IV ([place]) 10/11/2019  . Encounter for competency evaluation   . Acute respiratory distress syndrome (ARDS) due to COVID-19 virus ([place]) 09/29/2019  . Bacteremia due to Pseudomonas 09/29/2019  . Goals of care, counseling/discussion   . Palliative care by specialist    . HCAP (healthcare-associated pneumonia) 09/23/2019  . Colostomy in place [facility]) 09/23/2019  . Bleeding 07/11/2019  . Chronic osteomyelitis ([place]) 06/30/2019  . PICC (peripherally inserted central catheter) flush   . Normocytic anemia 06/21/2019  . Decubitus ulcer of sacral region, unstageable ([place]) 06/19/2019  . Hypotension 06/19/2019  . Anemia   . Acute respiratory failure with hypoxia ([place]) 06/12/2019  . Chronic systolic CHF (congestive heart failure) ([place])   . Acute blood loss anemia   . Pressure injury of skin 03/17/2019  . Acute renal failure (ARF) ([place])   . Empyema lung ([place])   . Shortness of breath   . Pleural effusion on right   . Sepsis ([place]) 03/04/2019  . Restless legs syndrome (RLS) 06/16/2017  . Hypertension, essential 06/16/2017  . CAD in native artery 04/27/2017  . Hydrocephalus ([place]) 09/08/2016  . Dizziness and giddiness   . Elevated troponin 09/06/2016  . Type II diabetes mellitus ([place])   . Ischemic cardiomyopathy   . Hyponatremia 07/01/2015  . Diabetes mellitus type 2, uncontrolled, with complications ([place]) 19/62/2297  . 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 OSA (obstructive sleep apnea) 12/20/2013  . Morbid obesity ([place]) 12/20/2013  . AKI (acute kidney injury) - Creatinine improved at d/c 12/14/2013  . Elevated TSH - will need f/u TFTs with PCP in 3-4 weeks 12/14/2013  . Junctional bradycardia - resolved 12/14/2013  . Syncope - due to bradycardia in setting of Digoxin Toxicity 12/11/2013  . Acute on chronic HFrEF (heart failure with reduced ejection fraction) ([place]) 12/06/2013  . At risk for sudden cardiac death - on LifeVest 2013-12-18  . Acute on chronic combined systolic and diastolic CHF (congestive heart failure) ([place]) Dec 18, 2013  . Cardiomyopathy, ischemic 2013-12-18  . Atrial flutter ([place]) 11/22/2013  . NSTEMI (non-ST elevated myocardial infarction) ([place]) 11/22/2013  . Long term current use of anticoagulant 10/19/2010  .  Hyperlipidemia 05/06/2010  . Edema 05/06/2010  . CAD S/P percutaneous coronary angioplasty - multiple PCIs 11/11/2008  . Atrial fibrillation, chronic ([place]) 11/11/2008  . GERD 11/11/2008  . [place] INTESTINE 11/11/2008  . COLONIC POLYPS, HX OF 11/11/2008  . [place] ALLERGY 11/11/2008   PCP:  [name], MD Pharmacy:  No Pharmacies Listed    Social Determinants of Health (SDOH) Interventions    Readmission Risk Interventions Readmission Risk Prevention Plan 10/14/2019 10/14/2019 07/18/2019  Transportation Screening Complete Complete Complete  Social Work Consult for [place] - - -  Medication Review Press photographer) Complete Complete Complete  PCP or Specialist appointment within 3-5 days of discharge Complete Complete (No Data)  [place] or [place] Complete Complete -  [place] or Home Care Consult Pt Refusal Comments - - return to SNF  SW Recovery Care/Counseling Consult Complete Complete -  Palliative Care Screening Not Applicable Not Applicable -  [place] Not Applicable Not Applicable Complete  Some recent data might be hidden

## 2020-02-19 NOTE — Progress Notes (Signed)
   02/19/20 1055  Clinical Encounter Type  Visited With Patient  Visit Type Initial;Spiritual support  Referral From Palliative care team  Consult/Referral To Chaplain  This chaplain responded to PMT consult for spiritual care.  The chaplain checked in with the Pt. RN-Velia before the visit.  The chaplain learned the Pt. had a restless night and describes himself as rested and more comfortable this morning.  The Pt. shared with the chaplain he is finally at peace with his health and next steps after a year of hospital admissions. The Pt. stated he decided to complete his DNR after the realization only God can determine life and death. The chaplain explored the Pt. relationships/communication with the Pt. family and friends.  The Pt. shared his son is his 73. The chaplain understands the Pt. chose to medically update his sister and sister-n- law. The chaplain learned from the Pt., the medical team is updating his son. The Pt. added my son is busy and separated from me by 22 hours.  The Pt. shared life changed when his partner/spouse died 70 years ago.  The Pt. accepted the invitation for F/U spiritual care to continue pastoral presence.

## 2020-02-19 NOTE — Progress Notes (Signed)
Patient having complaints of bladder spasms. RN noticed blood in urine. No clotting just blood tinged urine. Foley care has been completed, RN has irrigated and repositioned foley with no relief. No issues with urinary output. Catheter is not clogged.   MD made aware of patient's complaints and of findings in urine. New orders received.   RN will continue to monitor.

## 2020-02-19 NOTE — NC FL2 (Signed)
[place] LEVEL OF CARE SCREENING TOOL     IDENTIFICATION  Patient Name: Colton Lamb Birthdate: 1951-07-31 Sex: male Admission Date (Current Location): 02/16/2020  [facility] and [place] Number:  Herbalist and Address:  [facility], [place] [address] [place], [place] [address]      Provider Number: [id number]  Attending Physician Name and Address:  Damita Lack, MD  Relative Name and Phone Number:       Current Level of Care: Hospital Recommended Level of Care: [place] Prior Approval Number:    Date Approved/Denied:   PASRR Number: [id number]  Discharge Plan: SNF    Current Diagnoses: Patient Active Problem List   Diagnosis Date Noted  . Adult failure to thrive   . Weakness generalized   . DNR (do not resuscitate)   . CHF (congestive heart failure) ([place]) 02/16/2020  . Chest pain 10/26/2019  . Diarrhea 10/11/2019  . COVID-19 virus infection 10/11/2019  . Thrombocytopenia ([place]) 10/11/2019  . Sacral decubitus ulcer, stage IV ([place]) 10/11/2019  . Encounter for competency evaluation   . Acute respiratory distress syndrome (ARDS) due to COVID-19 virus ([place]) 09/29/2019  . Bacteremia due to Pseudomonas 09/29/2019  . Goals of care, counseling/discussion   . Palliative care by specialist   . HCAP (healthcare-associated pneumonia) 09/23/2019  . Colostomy in place [facility]) 09/23/2019  . Bleeding 07/11/2019  . Chronic osteomyelitis ([place]) 06/30/2019  . PICC (peripherally inserted central catheter) flush   . Normocytic anemia 06/21/2019  . Decubitus ulcer of sacral region, unstageable ([place]) 06/19/2019  . Hypotension 06/19/2019  . Anemia   . Acute respiratory failure with hypoxia ([place]) 06/12/2019  . Chronic systolic CHF (congestive heart failure) ([place])   . Acute blood loss anemia   . Pressure injury of skin 03/17/2019  . Acute renal failure (ARF) ([place])   . Empyema lung ([place])   . Shortness of breath   .  Pleural effusion on right   . Sepsis ([place]) 03/04/2019  . Restless legs syndrome (RLS) 06/16/2017  . Hypertension, essential 06/16/2017  . CAD in native artery 04/27/2017  . Hydrocephalus ([place]) 09/08/2016  . Dizziness and giddiness   . Elevated troponin 09/06/2016  . Type II diabetes mellitus ([place])   . Ischemic cardiomyopathy   . Hyponatremia 07/01/2015  . Diabetes mellitus type 2, uncontrolled, with complications ([place]) 20/94/7096  . 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 OSA (obstructive sleep apnea) 12/20/2013  . Morbid obesity ([place]) 12/20/2013  . AKI (acute kidney injury) - Creatinine improved at d/c 12/14/2013  . Elevated TSH - will need f/u TFTs with PCP in 3-4 weeks 12/14/2013  . Junctional bradycardia - resolved 12/14/2013  . Syncope - due to bradycardia in setting of Digoxin Toxicity 12/11/2013  . Acute on chronic HFrEF (heart failure with reduced ejection fraction) ([place]) 12/06/2013  . At risk for sudden cardiac death - on LifeVest 12/17/13  . Acute on chronic combined systolic and diastolic CHF (congestive heart failure) ([place]) 2013-12-17  . Cardiomyopathy, ischemic Dec 17, 2013  . Atrial flutter ([place]) 11/22/2013  . NSTEMI (non-ST elevated myocardial infarction) ([place]) 11/22/2013  . Long term current use of anticoagulant 10/19/2010  . Hyperlipidemia 05/06/2010  . Edema 05/06/2010  . CAD S/P percutaneous coronary angioplasty - multiple PCIs 11/11/2008  . Atrial fibrillation, chronic ([place]) 11/11/2008  . GERD 11/11/2008  . [place] INTESTINE 11/11/2008  . COLONIC POLYPS, HX OF 11/11/2008  . [place] ALLERGY 11/11/2008  Orientation RESPIRATION BLADDER Height & Weight     Self, Time, Situation, Place  O2 (5L nasal canula) Indwelling catheter, Continent Weight: 212 lb 1.3 oz (96.2 kg) Height:  5' 11"  (180.3 cm)  BEHAVIORAL SYMPTOMS/MOOD NEUROLOGICAL BOWEL NUTRITION STATUS      Colostomy, Continent Diet (see discharge  summary)  AMBULATORY STATUS COMMUNICATION OF NEEDS Skin   Extensive Assist Verbally Other (Comment) (MASD on groin and abdomen; generalized ecchymosis; wound on sacrum w/ foam dressing changed every 3 days; colostomy and foley)                       Personal Care Assistance Level of Assistance  Bathing, Feeding, Dressing Bathing Assistance: Maximum assistance Feeding assistance: Independent Dressing Assistance: Maximum assistance     Functional Limitations Info  Sight, Hearing, Speech Sight Info: Adequate Hearing Info: Adequate Speech Info: Adequate    SPECIAL CARE FACTORS FREQUENCY  OT (By licensed OT), PT (By licensed PT)     PT Frequency: 5x week OT Frequency: 5x week            Contractures      Additional Factors Info  Code Status, Allergies, Isolation Precautions, Insulin Sliding Scale Code Status Info: DNR Allergies Info: Iodine, Shrimp (Shellfish Allergy), Tetracycline   Insulin Sliding Scale Info: insulin aspart (novoLOG) injection 0-9 Units 3x daily with meals Isolation Precautions Info: contact VRE     Current Medications (02/19/2020):  This is the current hospital active medication list Current Facility-Administered Medications  Medication Dose Route Frequency Provider Last Rate Last Admin  . 0.9 %  sodium chloride infusion  250 mL Intravenous PRN Wynetta Fines T, MD 10 mL/hr at 02/19/20 1252 250 mL at 02/19/20 1252  . acetaminophen (TYLENOL) tablet 650 mg  650 mg Oral Q4H PRN Wynetta Fines T, MD      . albuterol (PROVENTIL) (2.5 MG/3ML) 0.083% nebulizer solution 2.5 mg  2.5 mg Inhalation Q6H PRN Wynetta Fines T, MD      . ascorbic acid (VITAMIN C) tablet 1,000 mg  1,000 mg Oral q morning - 10a Wynetta Fines T, MD   1,000 mg at 02/19/20 1610  . atorvastatin (LIPITOR) tablet 80 mg  80 mg Oral QHS Lequita Halt, MD   80 mg at 02/18/20 2139  . balsalazide (COLAZAL) capsule 750 mg  750 mg Oral TID Charlynne Cousins, MD   750 mg at 02/19/20 9604  . cefTRIAXone  (ROCEPHIN) 2 g in sodium chloride 0.9 % 100 mL IVPB  2 g Intravenous Q24H Charlynne Cousins, MD 200 mL/hr at 02/19/20 1254 2 g at 02/19/20 1254  . Chlorhexidine Gluconate Cloth 2 % PADS 6 each  6 each Topical Daily Lequita Halt, MD   6 each at 02/19/20 1254  . digoxin (LANOXIN) tablet 0.125 mg  0.125 mg Oral Daily Wynetta Fines T, MD   0.125 mg at 02/19/20 0919  . ferrous sulfate tablet 325 mg  325 mg Oral q morning - 10a Lequita Halt, MD   325 mg at 02/19/20 5409  . finasteride (PROSCAR) tablet 5 mg  5 mg Oral Daily Wynetta Fines T, MD   5 mg at 02/19/20 0928  . furosemide (LASIX) injection 40 mg  40 mg Intravenous BID Wynetta Fines T, MD   40 mg at 02/19/20 8119  . insulin aspart (novoLOG) injection 0-9 Units  0-9 Units Subcutaneous TID WC Wynetta Fines T, MD      . ipratropium-albuterol (DUONEB) 0.5-2.5 (3) MG/3ML  nebulizer solution 3 mL  3 mL Nebulization Q4H PRN Wynetta Fines T, MD      . nystatin (MYCOSTATIN/NYSTOP) topical powder 1 application  1 application Topical q morning - 10a Lequita Halt, MD   1 application at 14/43/15 [phone]  . ondansetron (ZOFRAN) injection 4 mg  4 mg Intravenous Q6H PRN Wynetta Fines T, MD   4 mg at 02/18/20 1054  . pantoprazole (PROTONIX) EC tablet 40 mg  40 mg Oral Daily Wynetta Fines T, MD   40 mg at 02/19/20 0919  . polyethylene glycol (MIRALAX / GLYCOLAX) packet 17 g  17 g Oral Daily PRN Amin, Ankit Chirag, MD      . potassium chloride SA (KLOR-CON) CR tablet 20 mEq  20 mEq Oral BID Charlynne Cousins, MD   20 mEq at 02/19/20 0919  . rivaroxaban (XARELTO) tablet 20 mg  20 mg Oral Q supper Wynetta Fines T, MD   20 mg at 02/18/20 1814  . saccharomyces boulardii (FLORASTOR) capsule 250 mg  250 mg Oral TID WC Wynetta Fines T, MD   250 mg at 02/19/20 1255  . senna-docusate (Senokot-S) tablet 2 tablet  2 tablet Oral QHS PRN Amin, Ankit Chirag, MD      . sodium chloride flush (NS) 0.9 % injection 3 mL  3 mL Intravenous Q12H Wynetta Fines T, MD   3 mL at 02/19/20 0921  . sodium  chloride flush (NS) 0.9 % injection 3 mL  3 mL Intravenous PRN Wynetta Fines T, MD      . tamsulosin [facility]) capsule 0.4 mg  0.4 mg Oral QPC breakfast Wynetta Fines T, MD   0.4 mg at 02/19/20 0920     Discharge Medications: Please see discharge summary for a list of discharge medications.  Relevant Imaging Results:  Relevant Lab Results:   Additional [place], LCSW

## 2020-02-19 NOTE — Progress Notes (Signed)
PROGRESS NOTE    Colton Lamb  TGG:[id number] DOB: January 12, 1951 DOA: 02/16/2020 PCP: [name], MD   Brief Narrative:  69 y.o. male past medical history significant for chronic systolic heart failure with an EF of 20% in February 2021, paroxysmal atrial fibrillation on Xarelto, essential hypertension, chronic respiratory failure on 4 L of oxygen, chronic sacral decubitus wound with a chronic indwelling Foley catheter, bedbound for more than 3 months presents to the ED with a 2-day history of generalized weakness and progressive worsening shortness of breath.  In the ED chest x-ray showed pulmonary edema with a left pleural effusion with a bicarb of 35, potassium 2.9 with blood cell count 10.4 was started on IV Lasix.   Assessment & Plan:   Active Problems:   Atrial fibrillation, chronic (HCC)   Acute on chronic combined systolic and diastolic CHF (congestive heart failure) (HCC)   Diabetes mellitus type 2, uncontrolled, with complications (HCC)   Hypertension, essential   Acute respiratory failure with hypoxia (HCC)   Sacral decubitus ulcer, stage IV (HCC)   CHF (congestive heart failure) (HCC)   Adult failure to thrive   Weakness generalized   DNR (do not resuscitate)  Acute on chronic respiratory failure with hypoxia due to acute on chronic congestive heart failure reduced ejection fraction, 20%. At baseline uses 4 L nasal cannula.  Currently requiring 5 L nasal cannula Continue Lasix IV 40 mg twice daily.  Monitor urine output. 2D echo 20% on February 2021 Strict input and output, monitor electrolytes and replete as necessary Repeat chest x-ray tomorrow morning  Essential hypertension Continue current medications  Atrial fibrillation, chronic (HCC) Currently on digoxin and Xarelto  Diabetes mellitus type 2, uncontrolled, with complications (HCC) Last A1c A1c of 5.1, Accu-Chek sliding scale  Unintentional weight loss Moderate protein calorie  malnutrition Encourage p.o. diet, 40 pound weight loss. Refer back to outpatient PCP for routine malignancy screening  Palliative care encounter: due to multiple medical issues and comorbidities-seen by palliative care team-plans to continue routine care, currently patient is DNR/DNI Overall has poor quality of life.  Sacral decubitus ulcer, stage IV [facility]): He has a negative light yellow drainage there is evidence of wound healing and scar tissue.  Wound care has been consulted recommended air mattress replacement and bilateral heel boots.  Xeroform gauze with dry and ABD pads to be changed twice daily. RN Pressure Injury Documentation: Pressure Injury 09/23/19 Buttocks Right Stage 4 - Full thickness tissue loss with exposed bone, tendon or muscle. purple, encircled with red; (Active)  09/23/19 2300  Location: Buttocks  Location Orientation: Right  Staging: Stage 4 - Full thickness tissue loss with exposed bone, tendon or muscle.  Wound Description (Comments): purple, encircled with red;  Present on Admission: Yes     Pressure Injury 09/23/19 Thigh Posterior;Proximal;Right Stage 2 -  Partial thickness loss of dermis presenting as a shallow open injury with a red, pink wound bed without slough. pink (Active)  09/23/19 2300  Location: Thigh  Location Orientation: Posterior;Proximal;Right  Staging: Stage 2 -  Partial thickness loss of dermis presenting as a shallow open injury with a red, pink wound bed without slough.  Wound Description (Comments): pink  Present on Admission: Yes     Pressure Injury 09/23/19 Buttocks Left Stage 2 -  Partial thickness loss of dermis presenting as a shallow open injury with a red, pink wound bed without slough. pink, 3 cm X 2 cm (Active)  09/23/19 2300  Location: Buttocks  Location Orientation: Left  Staging: Stage 2 -  Partial thickness loss of dermis presenting as a shallow open injury with a red, pink wound bed without slough.  Wound Description  (Comments): pink, 3 cm X 2 cm  Present on Admission: Yes    Estimated body mass index is 30.41 kg/m as calculated from the following:   Height as of this encounter: 5' 11"  (1.803 m).   Weight as of this encounter: 98.9 kg.   DVT prophylaxis: Xarelto Code Status: DNR Family Communication:  Spoke with patient's Daughter in Law  Status is: Inpatient    Dispo: The patient is from: Home              Anticipated d/c is to: SNF              Anticipated d/c date is: 2 days              Patient currently is not medically stable to d/c.  Continue hospital stay for IV diuretics due to exertional dyspnea on exertion requiring 4 L nasal cannula.   Body mass index is 29.58 kg/m. Subjective: Laying in the bed requiring 5 L nasal cannula.  Has exertional shortness of breath.  Review of Systems Otherwise negative except as per HPI, including: General: Denies fever, chills, night sweats or unintended weight loss. Resp: Denies hemoptysis Cardiac: Denies chest pain, palpitations, orthopnea, paroxysmal nocturnal dyspnea. GI: Denies abdominal pain, nausea, vomiting, diarrhea or constipation GU: Denies dysuria, frequency, hesitancy or incontinence MS: Denies muscle aches, joint pain or swelling Neuro: Denies headache, neurologic deficits (focal weakness, numbness, tingling), abnormal gait Psych: Denies anxiety, depression, SI/HI/AVH Skin: Denies new rashes or lesions ID: Denies sick contacts, exotic exposures, travel  Examination:  General exam: Appears calm and comfortable, appears chronically ill.  5 L nasal cannula Respiratory system: Bibasilar crackles Cardiovascular system: S1 & S2 heard, RRR. No JVD, murmurs, rubs, gallops or clicks. No pedal edema. Gastrointestinal system: Abdomen is nondistended, soft and nontender. No organomegaly or masses felt. Normal bowel sounds heard. Central nervous system: Alert and oriented. No focal neurological deficits. Extremities: Symmetric 5 x 5  power. Skin: No rashes, lesions or ulcers Psychiatry: Judgement and insight appear normal. Mood & affect appropriate.   Chronic indwelling Foley  Objective: Vitals:   02/19/20 0300 02/19/20 0440 02/19/20 0919 02/19/20 0920  BP:  111/68  (!) 114/91  Pulse:  67 84 84  Resp:  17    Temp:  97.9 F (36.6 C)    TempSrc:  Oral    SpO2:  100%    Weight: 96.2 kg     Height:        Intake/Output Summary (Last 24 hours) at 02/19/2020 1041 Last data filed at 02/19/2020 0955 Gross per 24 hour  Intake 460 ml  Output 1175 ml  Net -715 ml   Filed Weights   02/18/20 0013 02/18/20 0107 02/19/20 0300  Weight: 98.9 kg 98.9 kg 96.2 kg     Data Reviewed:   CBC: Recent Labs  Lab 02/16/20 1419 02/17/20 0545 02/19/20 0708  WBC 10.4 8.4 8.2  NEUTROABS 9.0* 6.8  --   HGB 9.6* 9.4* 8.4*  HCT 32.4* 31.5* 29.2*  MCV 86.4 85.6 87.4  PLT 225 234 250   Basic Metabolic Panel: Recent Labs  Lab 02/16/20 1419 02/17/20 0545 02/18/20 0555 02/19/20 0708  NA 137 141 145 144  K 2.9* 3.0* 3.7 3.5  CL 92* 92* 92* 94*  CO2 35* 41* 42* 41*  GLUCOSE 130* 98 93  87  BUN 12 11 11 15   CREATININE 0.90 0.89 0.96 1.03  CALCIUM 8.2* 8.3* 8.6* 8.3*   GFR: Estimated Creatinine Clearance: 81.3 mL/min (by C-G formula based on SCr of 1.03 mg/dL). Liver Function Tests: Recent Labs  Lab 02/16/20 1419  AST 23  ALT 13  ALKPHOS 192*  BILITOT 0.8  PROT 6.7  ALBUMIN 2.6*   No results for input(s): LIPASE, AMYLASE in the last 168 hours. No results for input(s): AMMONIA in the last 168 hours. Coagulation Profile: Recent Labs  Lab 02/19/20 0708  INR 2.1*   Cardiac Enzymes: No results for input(s): CKTOTAL, CKMB, CKMBINDEX, TROPONINI in the last 168 hours. BNP (last 3 results) No results for input(s): PROBNP in the last 8760 hours. HbA1C: Recent Labs    02/17/20 0545  HGBA1C 5.1   CBG: Recent Labs  Lab 02/18/20 0609 02/18/20 1105 02/18/20 1628 02/18/20 2129 02/19/20 0624  GLUCAP 79 105*  119* 109* 99   Lipid Profile: No results for input(s): CHOL, HDL, LDLCALC, TRIG, CHOLHDL, LDLDIRECT in the last 72 hours. Thyroid Function Tests: No results for input(s): TSH, T4TOTAL, FREET4, T3FREE, THYROIDAB in the last 72 hours. Anemia Panel: No results for input(s): VITAMINB12, FOLATE, FERRITIN, TIBC, IRON, RETICCTPCT in the last 72 hours. Sepsis Labs: No results for input(s): PROCALCITON, LATICACIDVEN in the last 168 hours.  Recent Results (from the past 240 hour(s))  Urine culture     Status: Abnormal   Collection Time: 02/16/20  2:19 PM   Specimen: Urine, Random  Result Value Ref Range Status   Specimen Description URINE, RANDOM  Final   Special Requests   Final    NONE Performed at [facility], [address] [place], [place] [address]    Culture (A)  Final    >=100,000 COLONIES/mL MULTIPLE SPECIES PRESENT, SUGGEST RECOLLECTION   Report Status 02/17/2020 FINAL  Final  SARS Coronavirus 2 by RT PCR (hospital order, performed in [facility] hospital lab) Nasopharyngeal Nasopharyngeal Swab     Status: None   Collection Time: 02/16/20  4:54 PM   Specimen: Nasopharyngeal Swab  Result Value Ref Range Status   SARS Coronavirus 2 NEGATIVE NEGATIVE Final    Comment: (NOTE) SARS-CoV-2 target nucleic acids are NOT DETECTED.  The SARS-CoV-2 RNA is generally detectable in upper and lower respiratory specimens during the acute phase of infection. The lowest concentration of SARS-CoV-2 viral copies this assay can detect is 250 copies / mL. A negative result does not preclude SARS-CoV-2 infection and should not be used as the sole basis for treatment or other patient management decisions.  A negative result may occur with improper specimen collection / handling, submission of specimen other than nasopharyngeal swab, presence of viral mutation(s) within the areas targeted by this assay, and inadequate number of viral copies (<250 copies / mL). A negative result must be  combined with clinical observations, patient history, and epidemiological information.  Fact Sheet for Patients:   StrictlyIdeas.no  Fact Sheet for Healthcare Providers: BankingDealers.co.za  This test is not yet approved or  cleared by the [place] FDA and has been authorized for detection and/or diagnosis of SARS-CoV-2 by FDA under an Emergency Use Authorization (EUA).  This EUA will remain in effect (meaning this test can be used) for the duration of the COVID-19 declaration under Section 564(b)(1) of the Act, 21 U.S.C. section 360bbb-3(b)(1), unless the authorization is terminated or revoked sooner.  Performed at [facility], [place] [name] [place], [place] [address]  Radiology Studies: No results found.      Scheduled Meds: . ascorbic acid  1,000 mg Oral q morning - 10a  . atorvastatin  80 mg Oral QHS  . balsalazide  750 mg Oral TID  . Chlorhexidine Gluconate Cloth  6 each Topical Daily  . digoxin  0.125 mg Oral Daily  . ferrous sulfate  325 mg Oral q morning - 10a  . finasteride  5 mg Oral Daily  . furosemide  40 mg Intravenous BID  . insulin aspart  0-9 Units Subcutaneous TID WC  . nystatin  1 application Topical q morning - 10a  . pantoprazole  40 mg Oral Daily  . potassium chloride  20 mEq Oral BID  . rivaroxaban  20 mg Oral Q supper  . saccharomyces boulardii  250 mg Oral TID WC  . sodium chloride flush  3 mL Intravenous Q12H  . tamsulosin  0.4 mg Oral QPC breakfast   Continuous Infusions: . sodium chloride    . cefTRIAXone (ROCEPHIN)  IV 2 g (02/18/20 1540)     LOS: 3 days   Time spent= 35 mins    Audi Wettstein Arsenio Loader, MD [facility]  If 7PM-7AM, please contact night-coverage  02/19/2020, 10:41 AM

## 2020-02-19 NOTE — Significant Event (Signed)
HOSPITAL MEDICINE OVERNIGHT EVENT NOTE  Notified by nursing that patient is complaining of severe lower abdominal pain, suspected bladder spasms.  Patient had Foley catheter exchange yesterday.  Patient also seems to be exhibiting some hematuria without clots.  Symptoms seem to persist despite irrigation and readjustment of catheter.  We will order coagulation profile and CBC with morning labs.  We will provide patient with dose of oxybutynin and Percocet and monitor for symptomatic improvement.  Sherryll Burger [place]

## 2020-02-19 NOTE — Progress Notes (Signed)
Heart Failure Stewardship Pharmacist Progress Note   PCP: [name], MD PCP-Cardiologist: Ida Rogue, MD   HPI:  69 yo M with PMH significant for CHF (EF 25-30%) with ICD placed 04/2014, paroxysmal atrial fibrillation, HTN, type 2 diabetes, HLD, OSA, CAD s/p PCI x 3 in 2008 and MI in 2015, chronic respiratory failure, chronic sacral decubitus wound with chronic indwelling foley catheter, bed-bound for >3 months. He presented to [facility] ED on 7/18 with increased shortness of breath, and subjective intermittent fevers. Chest xray in the ED showed pulmonary edema with left pleural effusion and he was started on IV lasix. Net negative 5.8L since admission.  ECHO on 7/19 with LVEF 25-30%, stable from last ECHO on 2/27.  Current HF Medications: Furosemide 40 mg IV BID Digoxin 0.125 mg daily  Prior to admission HF Medications: Torsemide 10 mg every other day Digoxin 0.125 mg daily  Pertinent Lab Values: . Serum creatinine 1.03, BUN 15, Potassium 3.5, Sodium 144, BNP 914, Digoxin 0.7 (7/19)   Vital Signs: . Weight: 212 lbs (226 lbs on admission) . Blood pressure: 110/60s . Heart rate: 60-70   Medication Assistance / Insurance Benefits Check: Does the patient have prescription insurance?   yes Type of insurance plan: Medicare - SilverScript  Does the patient qualify for medication assistance through manufacturers or grants?   Pending household income information  Eligible grants and/or patient assistance programs: pending  Medication assistance applications in progress: none  Medication assistance applications approved: none  Approved medication assistance renewals will be completed by: Dr. Donivan Scull office  Outpatient Pharmacy:  Prior to admission outpatient pharmacy: SNF Is the patient willing to use [place] at discharge?   Unknown - pending disposition Is the patient willing to transition their outpatient pharmacy to utilize a [facility] outpatient pharmacy?    Unknown - pending disposition   Assessment: 1. Acute on chronic systolic CHF (EF 27-06%), due to ischemic cardiomyopathy and sleep apnea. NYHA class III symptoms. - Continue furosemide 40 mg IV BID. Replace K. - No beta blocker - had been on hold since hospital discharge 10/02/19 for hypotension. Consider starting low dose metoprolol today - No ACE/ARB/ARNi - hypotension requiring midodrine PTA  limits use. No midodrine during this admission. May be able to start low dose ARB pending BP tolerability - Consider adding spironolactone. Can help increase K and augment diuresis. - No SGLT2i - caution initiation given chronic foley, recurrent UTIs (chronic colonization) and chronic sacral decubitus wound. - Continue digoxin 0.125 mg daily. Digoxin level 0.7 on 7/19.   Plan: 1) Medication changes recommended at this time: - Add low dose metoprolol +/- spironolactone  2) Patient assistance application(s): - None pending  3)  Education  - To be completed prior to discharge  Vertis Kelch, PharmD, BCPS Heart Failure Stewardship Pharmacist Phone [phone] 02/19/2020       11:26 AM

## 2020-02-20 ENCOUNTER — Inpatient Hospital Stay (HOSPITAL_COMMUNITY): Payer: Medicare Other

## 2020-02-20 LAB — CBC
HCT: 32.1 % — ABNORMAL LOW (ref 39.0–52.0)
Hemoglobin: 9.3 g/dL — ABNORMAL LOW (ref 13.0–17.0)
MCH: 25.4 pg — ABNORMAL LOW (ref 26.0–34.0)
MCHC: 29 g/dL — ABNORMAL LOW (ref 30.0–36.0)
MCV: 87.7 fL (ref 80.0–100.0)
Platelets: 256 10*3/uL (ref 150–400)
RBC: 3.66 MIL/uL — ABNORMAL LOW (ref 4.22–5.81)
RDW: 16.5 % — ABNORMAL HIGH (ref 11.5–15.5)
WBC: 7.9 10*3/uL (ref 4.0–10.5)
nRBC: 0 % (ref 0.0–0.2)

## 2020-02-20 LAB — BASIC METABOLIC PANEL
Anion gap: 9 (ref 5–15)
BUN: 17 mg/dL (ref 8–23)
CO2: 41 mmol/L — ABNORMAL HIGH (ref 22–32)
Calcium: 8.4 mg/dL — ABNORMAL LOW (ref 8.9–10.3)
Chloride: 92 mmol/L — ABNORMAL LOW (ref 98–111)
Creatinine, Ser: 1.01 mg/dL (ref 0.61–1.24)
GFR calc Af Amer: >60 mL/min (ref >60)
GFR calc non Af Amer: >60 mL/min (ref >60)
Glucose, Bld: 89 mg/dL (ref 70–99)
Potassium: 4.5 mmol/L (ref 3.5–5.1)
Sodium: 142 mmol/L (ref 135–145)

## 2020-02-20 LAB — GLUCOSE, CAPILLARY
Glucose-Capillary: 80 mg/dL (ref 70–99)
Glucose-Capillary: 94 mg/dL (ref 70–99)

## 2020-02-20 LAB — BRAIN NATRIURETIC PEPTIDE: B Natriuretic Peptide: 445.8 pg/mL — ABNORMAL HIGH (ref 0.0–100.0)

## 2020-02-20 LAB — PROCALCITONIN: Procalcitonin: 0.19 ng/mL

## 2020-02-20 LAB — MAGNESIUM: Magnesium: 1.9 mg/dL (ref 1.7–2.4)

## 2020-02-20 MED ORDER — METOPROLOL SUCCINATE ER 25 MG PO TB24
12.5000 mg | ORAL_TABLET | Freq: Every day | ORAL | Status: DC
  Administered 2020-02-20: 12.5 mg via ORAL
  Filled 2020-02-20: qty 1

## 2020-02-20 MED ORDER — OXYBUTYNIN CHLORIDE 5 MG PO TABS: 5.0000 mg | ORAL_TABLET | Freq: Three times a day (TID) | ORAL | Status: DC

## 2020-02-20 MED ORDER — SENNOSIDES-DOCUSATE SODIUM 8.6-50 MG PO TABS: 2.0000 | ORAL_TABLET | Freq: Every evening | ORAL | Status: AC | PRN

## 2020-02-20 MED ORDER — OXYCODONE-ACETAMINOPHEN 5-325 MG PO TABS: 1.0000 | ORAL_TABLET | Freq: Three times a day (TID) | ORAL | Status: DC | PRN

## 2020-02-20 MED ORDER — FUROSEMIDE 10 MG/ML IJ SOLN
40.0000 mg | Freq: Three times a day (TID) | INTRAMUSCULAR | Status: DC
  Filled 2020-02-20: qty 4

## 2020-02-20 MED ORDER — OXYBUTYNIN CHLORIDE 5 MG PO TABS
5.0000 mg | ORAL_TABLET | Freq: Three times a day (TID) | ORAL | Status: DC
  Administered 2020-02-20 (×2): 5 mg via ORAL
  Filled 2020-02-20 (×2): qty 1

## 2020-02-20 MED ORDER — METOPROLOL SUCCINATE ER 25 MG PO TB24: 12.5000 mg | ORAL_TABLET | Freq: Every day | ORAL | 0 refills | Status: AC

## 2020-02-20 MED ORDER — FUROSEMIDE 40 MG PO TABS
40.0000 mg | ORAL_TABLET | Freq: Every day | ORAL | 11 refills | Status: AC
Start: 2020-02-20 — End: 2020-05-16

## 2020-02-20 MED ORDER — POLYETHYLENE GLYCOL 3350 17 G PO PACK: 17.0000 g | PACK | Freq: Every day | ORAL | 0 refills | Status: AC | PRN

## 2020-02-20 NOTE — TOC Transition Note (Signed)
Transition of Care [facility]) - CM/SW Discharge Note   Patient Details  Name: Colton Lamb MRN: [id number] Date of Birth: 04/30/1951  Transition of Care [facility]) CM/SW Contact:  Vinie Sill, [place] Phone Number: 02/20/2020, 4:22 PM   Clinical Narrative:     Patient will DC to: [place] Date: 02/20/2020 Family Notified: [name], left voice message  Transport By: Corey Yoel   Per MD patient is ready for discharge. RN, patient, and facility notified of DC. Discharge Summary sent to facility. RN given number for [phone], room 1206-P. Ambulance transport requested for patient.   Clinical Social Worker signing off.  Thurmond Butts, MSW, [place] Clinical Social Worker    Final next level of care: Skilled Nursing Facility Barriers to Discharge: Barriers Resolved   Patient Goals and CMS Choice        Discharge Placement              Patient chooses bed at: [facility] Patient to be transferred to facility by: [place] Name of family member notified: Lisa,left voice message Patient and family notified of of transfer: 02/20/20  Discharge Plan and Services In-house Referral: Clinical Social Work                                   Social Determinants of Health (SDOH) Interventions     Readmission Risk Interventions Readmission Risk Prevention Plan 10/14/2019 10/14/2019 07/18/2019  Transportation Screening Complete Complete Complete  Social Work Consult for [place] - - -  Medication Review Press photographer) Complete Complete Complete  PCP or Specialist appointment within 3-5 days of discharge Complete Complete (No Data)  [place] or [place] Complete Complete -  [place] or Home Care Consult Pt Refusal Comments - - return to SNF  SW Recovery Care/Counseling Consult Complete Complete -  Palliative Care Screening Not Applicable Not Applicable -  [place] Not Applicable Not  Applicable Complete  Some recent data might be hidden

## 2020-02-20 NOTE — Progress Notes (Signed)
Heart Failure Stewardship Pharmacist Progress Note   PCP: [name], MD PCP-Cardiologist: Ida Rogue, MD   HPI:  69 yo M with PMH significant for CHF (EF 25-30%) with ICD placed 04/2014, paroxysmal atrial fibrillation, HTN, type 2 diabetes, HLD, OSA, CAD s/p PCI x 3 in 2008 and MI in 2015, chronic respiratory failure, chronic sacral decubitus wound with chronic indwelling foley catheter, bed-bound for >3 months. He presented to [facility] ED on 7/18 with increased shortness of breath, and subjective intermittent fevers. Chest xray in the ED showed pulmonary edema with left pleural effusion and he was started on IV lasix. Net negative 5.8L since admission.  ECHO on 7/19 with LVEF 25-30%, stable from last ECHO on 2/27.  Current HF Medications: Furosemide 40 mg IV BID Digoxin 0.125 mg daily  Prior to admission HF Medications: Torsemide 10 mg every other day Digoxin 0.125 mg daily  Pertinent Lab Values: . Serum creatinine 1.01, BUN 17, Potassium 4.5, Sodium 142, BNP 914>446, Digoxin 0.7 (7/19), Magnesium 1.9  Vital Signs: . Weight: 215 lbs - up 3 lbs from yesterday (226 lbs on admission) . Blood pressure: 110/80s . Heart rate: 60-70   Medication Assistance / Insurance Benefits Check: Does the patient have prescription insurance?   yes Type of insurance plan: Medicare - SilverScript  Does the patient qualify for medication assistance through manufacturers or grants?   Pending household income information  Eligible grants and/or patient assistance programs: pending  Medication assistance applications in progress: none  Medication assistance applications approved: none  Approved medication assistance renewals will be completed by: Dr. Donivan Scull office  Outpatient Pharmacy:  Prior to admission outpatient pharmacy: SNF Is the patient willing to use [place] at discharge?   Unknown - pending disposition Is the patient willing to transition their outpatient pharmacy to  utilize a [facility] outpatient pharmacy?   Unknown - pending disposition   Assessment: 1. Acute on chronic systolic CHF (EF 93-23%), due to ischemic cardiomyopathy and sleep apnea. NYHA class III symptoms. Weight 3 lbs up from yesterday. Stable SCr - Recommend to increase furosemide to 60 mg IV BID or 40 mg TID - No beta blocker - had been on hold since hospital discharge 10/02/19 for hypotension. Consider starting low dose metoprolol today - No ACE/ARB/ARNi - hypotension requiring midodrine PTA  limits use. No midodrine during this admission. May be able to start low dose ARB pending BP tolerability - Consider adding spironolactone. Can help increase K and augment diuresis. - No SGLT2i - caution initiation given chronic foley, recurrent UTIs (chronic colonization) and chronic sacral decubitus wound. - Continue digoxin 0.125 mg daily. Digoxin level 0.7 on 7/19.   Plan: 1) Medication changes recommended at this time: - Add low dose metoprolol + increase furosemide vs add spironolactone  2) Patient assistance application(s): - None pending  3)  Education  - To be completed prior to discharge  Vertis Kelch, PharmD, BCPS Heart Failure Stewardship Pharmacist Phone [phone] 02/20/2020       11:35 AM

## 2020-02-20 NOTE — Evaluation (Signed)
Physical Therapy Evaluation Patient Details Name: Colton Lamb MRN: [id number] DOB: 01/15/1951 Today's Date: 02/20/2020   History of Present Illness  69 y.o. male past medical history significant for chronic systolic heart failure with an EF of 20% in February 2021, paroxysmal atrial fibrillation on Xarelto, essential hypertension, chronic respiratory failure on 4 L of oxygen, chronic sacral decubitus wound with a chronic indwelling Foley catheter, bedbound for more than 3 months presents to the ED with a 2-day history of generalized weakness and progressive worsening shortness of breath  Clinical Impression  Pt presents to PT with deficits in functional mobility, balance, activity tolerance, ROM, strength, and power. Pt is self-limiting during session, refusing supine to sit or sitting activity and citing fatigue as the reason. Pt does roll in bed and participates in strength/ROM assessment. Pt expresses the desire to participate in PT session at SNF and wants to return for rehab with an eventual goal of going back home. Pt demonstrates the potential to improve functional mobility and activity tolerance with continued acute PT services. PT recommends SNF placement at this time.    Follow Up Recommendations SNF    Equipment Recommendations  Hospital bed (mechanical lift)    Recommendations for Other Services       Precautions / Restrictions Precautions Precautions: Fall Restrictions Weight Bearing Restrictions: No      Mobility  Bed Mobility Overal bed mobility: Needs Assistance Bed Mobility: Rolling Rolling: Supervision (significant use of rails)         General bed mobility comments: pt rolls to both sides with significant use of rails. Pt refuses attempts at supine to sit  Transfers                    Ambulation/Gait                Stairs            Wheelchair Mobility    Modified Rankin (Stroke Patients Only)       Balance Overall  balance assessment:  (pt refuses sitting at edge of bed)                                           Pertinent Vitals/Pain Pain Assessment: Faces Faces Pain Scale: Hurts little more Pain Location: generalized Pain Descriptors / Indicators: Grimacing Pain Intervention(s): Monitored during session    Home Living Family/patient expects to be discharged to:: Skilled nursing facility                 Additional Comments: pt in SNF prior to admission per pt    Prior Function Level of Independence: Needs assistance   Gait / Transfers Assistance Needed: requires assistance for all mobility, mostly in bed, does report sitting at edge of bed some and being dependently scooted to recliner some at facility but otherwise in bed  ADL's / Homemaking Assistance Needed: requires assistance for all ADLs        Hand Dominance   Dominant Hand: Right    Extremity/Trunk Assessment   Upper Extremity Assessment Upper Extremity Assessment: Generalized weakness    Lower Extremity Assessment Lower Extremity Assessment: RLE deficits/detail;LLE deficits/detail RLE Deficits / Details: knee flexion limited to ~100 degrees, grossly 3+/5 LLE Deficits / Details: knee flexion limited to ~80 degrees passively, grossly 3+/5 LLE strength    Cervical / Trunk Assessment Cervical / Trunk Assessment:  (  difficult to assess as pt refuses sitting edge of bed)  Communication   Communication: No difficulties  Cognition Arousal/Alertness: Awake/alert Behavior During Therapy: WFL for tasks assessed/performed Overall Cognitive Status: Within Functional Limits for tasks assessed                                        General Comments General comments (skin integrity, edema, etc.): pt reports wanting to participate in PT services at facility, does refuse sititng up at this time due to fatigue and wanting to rest.. Pt on 5L [place] during session    Exercises     Assessment/Plan     PT Assessment Patient needs continued PT services  PT Problem List Decreased strength;Decreased activity tolerance;Decreased range of motion;Decreased balance;Decreased mobility;Decreased knowledge of use of DME;Decreased safety awareness;Decreased knowledge of precautions;Cardiopulmonary status limiting activity       PT Treatment Interventions DME instruction;Functional mobility training;Therapeutic activities;Therapeutic exercise;Balance training;Neuromuscular re-education;Cognitive remediation;Patient/family education    PT Goals (Current goals can be found in the Care Plan section)  Acute Rehab PT Goals Patient Stated Goal: To return to rehab PT Goal Formulation: With patient Time For Goal Achievement: 03/05/20 Potential to Achieve Goals: Fair    Frequency Min 2X/week   Barriers to discharge        Co-evaluation               AM-PAC PT "6 Clicks" Mobility  Outcome Measure Help needed turning from your back to your side while in a flat bed without using bedrails?: None Help needed moving from lying on your back to sitting on the side of a flat bed without using bedrails?: Total Help needed moving to and from a bed to a chair (including a wheelchair)?: Total Help needed standing up from a chair using your arms (e.g., wheelchair or bedside chair)?: Total Help needed to walk in hospital room?: Total Help needed climbing 3-5 steps with a railing? : Total 6 Click Score: 9    End of Session Equipment Utilized During Treatment: Oxygen Activity Tolerance: Other (comment) (self-limiting) Patient left: in bed;with call bell/phone within reach;with bed alarm set Nurse Communication: Mobility status;Need for lift equipment PT Visit Diagnosis: Other abnormalities of gait and mobility (R26.89);Muscle weakness (generalized) (M62.81)    Time: 8182-9937 PT Time Calculation (min) (ACUTE ONLY): 25 min   Charges:   PT Evaluation $PT Eval Moderate Complexity: 1 Mod           Zenaida Niece, PT, DPT Acute Rehabilitation Pager: [phone]   Zenaida Niece 02/20/2020, 3:46 PM

## 2020-02-20 NOTE — Progress Notes (Signed)
Patient ID: Colton Lamb, male   DOB: [birth year], 69 y.o.   MRN: [id number]  This NP visited patient at the bedside as a follow up for palliative medicine needs and emotional support.   Continued conversation with [name] regarding his current medical situation. Education offered on his multiple comorbidities and the impact on his long-term prognosis.  Education offered on concept specific to human mortality and adult failure to thrive,  and the limitations of medical interventions to prolong quality of life when the body does fail to thrive.  Patient understands the seriousness of his current medical situation however he remains hopeful and open to treat the treatable and for continued prolonged life.  He does not have a documented healthcare power of attorney however he tells me that his stepson [name] would speak for him in the event that he could not speak for himself.  Plan of care -DNR/DNI-documented today  -Open to all other offered and available medical interventions to prolong life -Transition back to SNF when medically stable -Outpatient community-based palliative at facility  MOST form updated to reflect changes, pink copy in hard cahrt  Discussed with patient the importance of continued conversation with his  family and his medical providers regarding overall plan of care and treatment options,  ensuring decisions are within the context of the patients values and GOCs.  Questions and concerns addressed   Discussed with Dr Reesa Chew Total time spent on the unit was 25 minutes  Greater than 50% of the time was spent in counseling and coordination of care  Wadie Lessen NP  Palliative Medicine Team Team Phone # [phone] Pager [phone]

## 2020-02-20 NOTE — Progress Notes (Signed)
Tele and IV removed. Tolerated well.

## 2020-02-20 NOTE — Progress Notes (Signed)
PROGRESS NOTE    Colton Lamb  NTI:[id number] DOB: 01-Oct-1950 DOA: 02/16/2020 PCP: [name], MD   Brief Narrative:  69 y.o. male past medical history significant for chronic systolic heart failure with an EF of 20% in February 2021, paroxysmal atrial fibrillation on Xarelto, essential hypertension, chronic respiratory failure on 4 L of oxygen, chronic sacral decubitus wound with a chronic indwelling Foley catheter, bedbound for more than 3 months presents to the ED with a 2-day history of generalized weakness and progressive worsening shortness of breath.  In the ED chest x-ray showed pulmonary edema with a left pleural effusion with a bicarb of 35, potassium 2.9 with blood cell count 10.4 was started on IV Lasix.  He diuresed well, due to bladder spasm he was also started on oxybutynin.   Assessment & Plan:   Active Problems:   Atrial fibrillation, chronic (HCC)   Acute on chronic combined systolic and diastolic CHF (congestive heart failure) (HCC)   Diabetes mellitus type 2, uncontrolled, with complications (HCC)   Hypertension, essential   Acute respiratory failure with hypoxia (HCC)   Sacral decubitus ulcer, stage IV (HCC)   CHF (congestive heart failure) (HCC)   Adult failure to thrive   Weakness generalized   DNR (do not resuscitate)  Acute on chronic respiratory failure with hypoxia due to acute on chronic congestive heart failure reduced ejection fraction, 20%. This morning patient is on 4 L nasal cannula. Continue Lasix 40 mg IV twice daily.  Monitor urine output. 2D echo 20% on February 2021 Strict input and output, monitor electrolytes and replete as necessary Chest x-ray 7/22-stable cardiomegaly, mild improvement in pulmonary infiltrates/edema BNP trending downwards Procalcitonin-negative  Essential hypertension Continue current medications  Atrial fibrillation, chronic (HCC) Currently on digoxin and Xarelto  Diabetes mellitus type 2, uncontrolled,  with complications (HCC) Last A1c A1c of 5.1, Accu-Chek sliding scale  Unintentional weight loss Moderate protein calorie malnutrition Encourage p.o. diet, 40 pound weight loss. Refer back to outpatient PCP for routine malignancy screening  Palliative care encounter: due to multiple medical issues and comorbidities-seen by palliative care team-plans to continue routine care, currently patient is DNR/DNI Overall has poor quality of life.  Chronic indwelling Foley with bladder spasm -At Ditropan 5 mg 3 times daily.  Percocet as needed with bowel regimen  Sacral decubitus ulcer, stage IV [facility]): He has a negative light yellow drainage there is evidence of wound healing and scar tissue.  Wound care has been consulted recommended air mattress replacement and bilateral heel boots.  Xeroform gauze with dry and ABD pads to be changed twice daily. RN Pressure Injury Documentation: Pressure Injury 09/23/19 Buttocks Right Stage 4 - Full thickness tissue loss with exposed bone, tendon or muscle. purple, encircled with red; (Active)  09/23/19 2300  Location: Buttocks  Location Orientation: Right  Staging: Stage 4 - Full thickness tissue loss with exposed bone, tendon or muscle.  Wound Description (Comments): purple, encircled with red;  Present on Admission: Yes     Pressure Injury 09/23/19 Thigh Posterior;Proximal;Right Stage 2 -  Partial thickness loss of dermis presenting as a shallow open injury with a red, pink wound bed without slough. pink (Active)  09/23/19 2300  Location: Thigh  Location Orientation: Posterior;Proximal;Right  Staging: Stage 2 -  Partial thickness loss of dermis presenting as a shallow open injury with a red, pink wound bed without slough.  Wound Description (Comments): pink  Present on Admission: Yes     Pressure Injury 09/23/19 Buttocks Left Stage 2 -  Partial thickness loss of dermis presenting as a shallow open injury with a red, pink wound bed without slough.  pink, 3 cm X 2 cm (Active)  09/23/19 2300  Location: Buttocks  Location Orientation: Left  Staging: Stage 2 -  Partial thickness loss of dermis presenting as a shallow open injury with a red, pink wound bed without slough.  Wound Description (Comments): pink, 3 cm X 2 cm  Present on Admission: Yes    Estimated body mass index is 30.41 kg/m as calculated from the following:   Height as of this encounter: 5' 11"  (1.803 m).   Weight as of this encounter: 98.9 kg.   DVT prophylaxis: Xarelto Code Status: DNR Family Communication: None at bedside  Status is: Inpatient    Dispo: The patient is from: Home              Anticipated d/c is to: SNF              Anticipated d/c date is: 1-2 days              Patient currently is not medically stable to d/c.  Ongoing IV diuretics in the meantime TOC team working on placement. Body mass index is 30.01 kg/m. Subjective: Patient denies any complaints, feels slightly better today.  I explained to him importance of participation in physical therapy he agrees to this.  Review of Systems Otherwise negative except as per HPI, including: General: Denies fever, chills, night sweats or unintended weight loss. Resp: Denies cough, wheezing, shortness of breath. Cardiac: Denies chest pain, palpitations, orthopnea, paroxysmal nocturnal dyspnea. GI: Denies abdominal pain, nausea, vomiting, diarrhea or constipation GU: Denies dysuria, frequency, hesitancy or incontinence MS: Denies muscle aches, joint pain or swelling Neuro: Denies headache, neurologic deficits (focal weakness, numbness, tingling), abnormal gait Psych: Denies anxiety, depression, SI/HI/AVH Skin: Denies new rashes or lesions ID: Denies sick contacts, exotic exposures, travel  Examination: Constitutional: Not in acute distress, chronically ill.  4 L nasal cannula Respiratory: Mild bibasilar crackles Cardiovascular: Normal sinus rhythm, no rubs Abdomen: Nontender nondistended good  bowel sounds Musculoskeletal: No edema noted Skin: No rashes seen Neurologic: CN 2-12 grossly intact.  And nonfocal Psychiatric: Normal judgment and insight. Alert and oriented x 3. Normal mood. Chronic indwelling Foley  Objective: Vitals:   02/19/20 1927 02/19/20 1928 02/20/20 0310 02/20/20 0319  BP: (!) 89/52 108/65 108/76   Pulse: 82 78 67   Resp: 17  17   Temp: 98.4 F (36.9 C)  97.8 F (36.6 C)   TempSrc: Oral  Oral   SpO2: 100%  100%   Weight:    97.6 kg  Height:        Intake/Output Summary (Last 24 hours) at 02/20/2020 1012 Last data filed at 02/20/2020 0840 Gross per 24 hour  Intake 1126.17 ml  Output 900 ml  Net 226.17 ml   Filed Weights   02/18/20 0107 02/19/20 0300 02/20/20 0319  Weight: 98.9 kg 96.2 kg 97.6 kg     Data Reviewed:   CBC: Recent Labs  Lab 02/16/20 1419 02/17/20 0545 02/19/20 0708 02/20/20 0456  WBC 10.4 8.4 8.2 7.9  NEUTROABS 9.0* 6.8  --   --   HGB 9.6* 9.4* 8.4* 9.3*  HCT 32.4* 31.5* 29.2* 32.1*  MCV 86.4 85.6 87.4 87.7  PLT 225 234 231 110   Basic Metabolic Panel: Recent Labs  Lab 02/16/20 1419 02/17/20 0545 02/18/20 0555 02/19/20 0708 02/20/20 0456  NA 137 141 145 144 142  K 2.9* 3.0* 3.7 3.5 4.5  CL 92* 92* 92* 94* 92*  CO2 35* 41* 42* 41* 41*  GLUCOSE 130* 98 93 87 89  BUN 12 11 11 15 17   CREATININE 0.90 0.89 0.96 1.03 1.01  CALCIUM 8.2* 8.3* 8.6* 8.3* 8.4*  MG  --   --   --   --  1.9   GFR: Estimated Creatinine Clearance: 83.4 mL/min (by C-G formula based on SCr of 1.01 mg/dL). Liver Function Tests: Recent Labs  Lab 02/16/20 1419  AST 23  ALT 13  ALKPHOS 192*  BILITOT 0.8  PROT 6.7  ALBUMIN 2.6*   No results for input(s): LIPASE, AMYLASE in the last 168 hours. No results for input(s): AMMONIA in the last 168 hours. Coagulation Profile: Recent Labs  Lab 02/19/20 0708  INR 2.1*   Cardiac Enzymes: No results for input(s): CKTOTAL, CKMB, CKMBINDEX, TROPONINI in the last 168 hours. BNP (last 3  results) No results for input(s): PROBNP in the last 8760 hours. HbA1C: No results for input(s): HGBA1C in the last 72 hours. CBG: Recent Labs  Lab 02/19/20 0624 02/19/20 1145 02/19/20 1626 02/19/20 2111 02/20/20 0621  GLUCAP 99 83 110* 138* 80   Lipid Profile: No results for input(s): CHOL, HDL, LDLCALC, TRIG, CHOLHDL, LDLDIRECT in the last 72 hours. Thyroid Function Tests: Recent Labs    02/19/20 1844  TSH 6.320*   Anemia Panel: No results for input(s): VITAMINB12, FOLATE, FERRITIN, TIBC, IRON, RETICCTPCT in the last 72 hours. Sepsis Labs: Recent Labs  Lab 02/20/20 0456  PROCALCITON 0.19    Recent Results (from the past 240 hour(s))  Urine culture     Status: Abnormal   Collection Time: 02/16/20  2:19 PM   Specimen: Urine, Random  Result Value Ref Range Status   Specimen Description URINE, RANDOM  Final   Special Requests   Final    NONE Performed at [facility], [address] [place], [place] [address]    Culture (A)  Final    >=100,000 COLONIES/mL MULTIPLE SPECIES PRESENT, SUGGEST RECOLLECTION   Report Status 02/17/2020 FINAL  Final  SARS Coronavirus 2 by RT PCR (hospital order, performed in [facility] hospital lab) Nasopharyngeal Nasopharyngeal Swab     Status: None   Collection Time: 02/16/20  4:54 PM   Specimen: Nasopharyngeal Swab  Result Value Ref Range Status   SARS Coronavirus 2 NEGATIVE NEGATIVE Final    Comment: (NOTE) SARS-CoV-2 target nucleic acids are NOT DETECTED.  The SARS-CoV-2 RNA is generally detectable in upper and lower respiratory specimens during the acute phase of infection. The lowest concentration of SARS-CoV-2 viral copies this assay can detect is 250 copies / mL. A negative result does not preclude SARS-CoV-2 infection and should not be used as the sole basis for treatment or other patient management decisions.  A negative result may occur with improper specimen collection / handling, submission of specimen other than  nasopharyngeal swab, presence of viral mutation(s) within the areas targeted by this assay, and inadequate number of viral copies (<250 copies / mL). A negative result must be combined with clinical observations, patient history, and epidemiological information.  Fact Sheet for Patients:   StrictlyIdeas.no  Fact Sheet for Healthcare Providers: BankingDealers.co.za  This test is not yet approved or  cleared by the [place] FDA and has been authorized for detection and/or diagnosis of SARS-CoV-2 by FDA under an Emergency Use Authorization (EUA).  This EUA will remain in effect (meaning this test can be  used) for the duration of the COVID-19 declaration under Section 564(b)(1) of the Act, 21 U.S.C. section 360bbb-3(b)(1), unless the authorization is terminated or revoked sooner.  Performed at [facility], [place] [address], [place], [place] [address]   Urine Culture     Status: Abnormal (Preliminary result)   Collection Time: 02/18/20  4:03 PM   Specimen: Urine, Random  Result Value Ref Range Status   Specimen Description URINE, RANDOM  Final   Special Requests NONE  Final   Culture (A)  Final    >=100,000 COLONIES/mL GRAM POSITIVE COCCI CULTURE REINCUBATED FOR BETTER GROWTH Performed at [name], [place] [address], [place],  [address]    Report Status PENDING  Incomplete         Radiology Studies: DG Chest Port 1 View  Result Date: 02/20/2020 CLINICAL DATA:  Shortness of breath. EXAM: PORTABLE CHEST 1 VIEW COMPARISON:  CT 02/16/2020.  Chest x-ray 02/16/2020. FINDINGS: Cardiac pacer stable position. Stable cardiomegaly. Bibasilar atelectasis. Bilateral pulmonary infiltrates/edema again noted with slight improvement from prior exam. Small left pleural effusion again noted. No pneumothorax. IMPRESSION: 1.  Cardiac pacer stable position.  Stable cardiomegaly. 2. Bibasilar atelectasis. Bilateral pulmonary  infiltrates/edema again noted with slight improvement from prior exam. Small left pleural effusion again noted. Electronically Signed   By: Marcello Moores  Register   On: 02/20/2020 08:18        Scheduled Meds: . ascorbic acid  1,000 mg Oral q morning - 10a  . atorvastatin  80 mg Oral QHS  . balsalazide  750 mg Oral TID  . Chlorhexidine Gluconate Cloth  6 each Topical Daily  . digoxin  0.125 mg Oral Daily  . ferrous sulfate  325 mg Oral q morning - 10a  . finasteride  5 mg Oral Daily  . furosemide  40 mg Intravenous BID  . insulin aspart  0-9 Units Subcutaneous TID WC  . nystatin  1 application Topical q morning - 10a  . oxybutynin  5 mg Oral TID  . pantoprazole  40 mg Oral Daily  . potassium chloride  20 mEq Oral BID  . rivaroxaban  20 mg Oral Q supper  . saccharomyces boulardii  250 mg Oral TID WC  . sodium chloride flush  3 mL Intravenous Q12H  . tamsulosin  0.4 mg Oral QPC breakfast   Continuous Infusions: . sodium chloride 10 mL/hr at 02/19/20 2200  . cefTRIAXone (ROCEPHIN)  IV Stopped (02/19/20 1324)     LOS: 4 days   Time spent= 35 mins    Karmyn Lowman Arsenio Loader, MD [facility]  If 7PM-7AM, please contact night-coverage  02/20/2020, 10:12 AM

## 2020-02-20 NOTE — Discharge Summary (Signed)
Physician Discharge Summary  ELIN SEATS HUD:[id number] DOB: 11-19-1950 DOA: 02/16/2020  PCP: [name], MD  Admit date: 02/16/2020 Discharge date: 02/20/2020  Admitted From: rehab Disposition: Rehab  Recommendations for Outpatient Follow-up:  1. Follow up with PCP in 1-2 weeks 2. Check BMP/magnesium levels 02/24/2020 3. Lasix 40 mg daily, discontinue torsemide 4. Added metoprolol 12.5 mg daily, uptitrate as necessary 5. Bowel regimen as needed as patient will be on iron supplements 6. Oxybutynin 5 mg 3 times daily 7. Wound care instructions as below   Discharge Condition: Stable CODE STATUS: DNR Diet recommendation: Heart healthy with fluid restriction 1800 cc daily  Brief/Interim Summary: 69 y.o.malepast medical history significant for chronic systolic heart failure with an EF of 20% in February 2021, paroxysmal atrial fibrillation on Xarelto, essential hypertension, chronic respiratory failure on 4 L of oxygen, chronic sacral decubitus wound with a chronic indwelling Foley catheter, bedbound for more than 3 months presents to the ED with a 2-day history of generalized weakness and progressive worsening shortness of breath. In the ED chest x-ray showed pulmonary edema with a left pleural effusion with a bicarb of 35, potassium 2.9 with blood cell count 10.4 was started on IV Lasix.  He diuresed well, due to bladder spasm he was also started on oxybutynin.  His Lasix was transitioned to oral.  PT recommended SNF.  Stable for discharge. Attempted to call his son on the day of discharge but no answer.  I did speak with patient's daughter-in-law yesterday.  Acute on chronic respiratory failure with hypoxia due to acute on chronic congestive heart failure reduced ejection fraction, 20%. 4 L nasal cannula, wean off as appropriate Lasix 40 mg orally daily.  Lab work on 7/26 to make further adjustments as needed.  Would recommend outpatient follow-up with cardiology, Dr.  Rockey Situ. 2D echo 20% on February 2021 Strict input and output, monitor electrolytes and replete as necessary Chest x-ray 7/22-stable cardiomegaly, mild improvement in pulmonary infiltrates/edema Toprol 12.20m Daily added.  BNP trending downwards Procalcitonin-negative  Essential hypertension Continue current medications  Atrial fibrillation, chronic (HCC) Currently on digoxin and Xarelto  Diabetes mellitus type 2, uncontrolled, with complications (HCC) Last A1c A1c of 5.1, resume home regimen  Unintentional weight loss Moderate protein calorie malnutrition Encourage p.o. diet, 40 pound weight loss. Refer back to outpatient PCP for routine malignancy screening  Palliative care encounter: Seen by palliative care, appreciate their input.  Patient is DNR/DNI.  Chronic indwelling Foley with bladder spasm -At Ditropan 5 mg 3 times daily.    Follow-up outpatient urology  Sacral decubitus ulcer, stage IV ([facility]: He has a negative light yellow drainage there is evidence of wound healing and scar tissue. Wound care has been consulted recommended air mattress replacement and bilateral heel boots. Xeroform gauze with dry and ABD pads to be changed twice daily.  Body mass index is 30.01 kg/m.  Pressure Injury 09/23/19 Buttocks Right Stage 4 - Full thickness tissue loss with exposed bone, tendon or muscle. purple, encircled with red; (Active)  09/23/19 2300  Location: Buttocks  Location Orientation: Right  Staging: Stage 4 - Full thickness tissue loss with exposed bone, tendon or muscle.  Wound Description (Comments): purple, encircled with red;  Present on Admission: Yes     Pressure Injury 09/23/19 Thigh Posterior;Proximal;Right Stage 2 -  Partial thickness loss of dermis presenting as a shallow open injury with a red, pink wound bed without slough. pink (Active)  09/23/19 2300  Location: Thigh  Location Orientation: Posterior;Proximal;Right  Staging:  Stage 2 -  Partial  thickness loss of dermis presenting as a shallow open injury with a red, pink wound bed without slough.  Wound Description (Comments): pink  Present on Admission: Yes     Pressure Injury 09/23/19 Buttocks Left Stage 2 -  Partial thickness loss of dermis presenting as a shallow open injury with a red, pink wound bed without slough. pink, 3 cm X 2 cm (Active)  09/23/19 2300  Location: Buttocks  Location Orientation: Left  Staging: Stage 2 -  Partial thickness loss of dermis presenting as a shallow open injury with a red, pink wound bed without slough.  Wound Description (Comments): pink, 3 cm X 2 cm  Present on Admission: Yes        Discharge Diagnoses:  Active Problems:   Atrial fibrillation, chronic (HCC)   Acute on chronic combined systolic and diastolic CHF (congestive heart failure) (HCC)   Diabetes mellitus type 2, uncontrolled, with complications (HCC)   Hypertension, essential   Acute respiratory failure with hypoxia (HCC)   Sacral decubitus ulcer, stage IV (HCC)   CHF (congestive heart failure) ([place])   Adult failure to thrive   Weakness generalized   DNR (do not resuscitate)      Consultations:  Palliative care  Subjective: Feels okay no new complaints.  Discharge Exam: Vitals:   02/20/20 0310 02/20/20 1124  BP: 108/76 115/82  Pulse: 67 69  Resp: 17 18  Temp: 97.8 F (36.6 C) 97.8 F (36.6 C)  SpO2: 100% 100%   Vitals:   02/19/20 1928 02/20/20 0310 02/20/20 0319 02/20/20 1124  BP: 108/65 108/76  115/82  Pulse: 78 67  69  Resp:  17  18  Temp:  97.8 F (36.6 C)  97.8 F (36.6 C)  TempSrc:  Oral  Oral  SpO2:  100%  100%  Weight:   97.6 kg   Height:        General: Pt is alert, awake, not in acute distress, 4 L nasal cannula Cardiovascular: RRR, S1/S2 +, no rubs, no gallops Respiratory: CTA bilaterally, no wheezing, no rhonchi Abdominal: Soft, NT, ND, bowel sounds + Extremities: no edema, no cyanosis  Discharge Instructions  Discharge  Instructions    Discharge wound care:   Complete by: As directed    LLQ end colostomy Stomal assessment/size: pouch intact Peristomal assessment: 1 and 1/4 inch round stoma Treatment options for stomal/peristomal skin: skin barrier ring Output:soft brown stool in a closed end pouch Ostomy pouching: 2pc. 2 and 3/4 inch pouching system.  We will change to a drainable (able to empty) pouch while here. Pouch is [employer], skin barrier is [name]  #2, skin barrier ring is lawson # [id number]. Education provided: None Enrolled patient in [place] program: No. Patient is dependent in ostomy care.  Chronic Stage 4 pressure injury Wound type:Pressure Pressure Injury POA: Yes/No/NA Measurement: 4cm x 0.4cm x 0.2cm Wound ZWC:HENI pink, moist Drainage (amount, consistency, odor) mucus, light yellow Periwound:with evidence of previous wound healing, scar tissue Dressing procedure/placement/frequency: I will provide a mattress replacement and bilateral heel boots for pressure injury prevention. Wound care will be with a antimicrobial nonadherent (xeroform) gauze topped with a dry gauze and ABD pad changed twice daily. Patient is to be turned side to side and time in the supine position minimized.     Allergies as of 02/20/2020      Reactions   Iodine Hives, Shortness Of Breath, Swelling   Shrimp [shellfish Allergy] Hives, Shortness  Of Breath, Swelling   Tetracycline Rash      Medication List    STOP taking these medications   HYDROcodone-acetaminophen 5-325 MG tablet Commonly known as: NORCO/VICODIN   torsemide 10 MG tablet Commonly known as: DEMADEX     TAKE these medications   acetaminophen 325 MG tablet Commonly known as: TYLENOL Take 1-2 tablets (325-650 mg total) by mouth every 4 (four) hours as needed for mild pain. What changed: how much to take   Albuterol Sulfate 108 (90 Base) MCG/ACT Aepb Commonly known as: PROAIR RESPICLICK Inhale 1 puff into the lungs every  6 (six) hours as needed (shortness of breath).   ascorbic acid 500 MG tablet Commonly known as: VITAMIN C Take 1 tablet (500 mg total) by mouth 2 (two) times daily. What changed:   how much to take  when to take this   atorvastatin 80 MG tablet Commonly known as: LIPITOR TAKE 1 TABLET BY MOUTH EVERY DAY What changed: when to take this   balsalazide 750 MG capsule Commonly known as: COLAZAL TAKE 1 CAPSULE (750 MG TOTAL) BY MOUTH 3 (THREE) TIMES DAILY. What changed:   how much to take  how to take this  when to take this  additional instructions   DECUBI-VITE PO Take 1 capsule by mouth daily.   digoxin 0.125 MG tablet Commonly known as: LANOXIN Take 1 tablet (0.125 mg total) by mouth daily.   ferrous sulfate 325 (65 FE) MG EC tablet Take 325 mg by mouth every morning.   finasteride 5 MG tablet Commonly known as: PROSCAR Take 1 tablet (5 mg total) by mouth daily.   Florastor 250 MG capsule Generic drug: saccharomyces boulardii Take 250 mg by mouth 3 (three) times daily with meals.   furosemide 40 MG tablet Commonly known as: Lasix Take 1 tablet (40 mg total) by mouth daily.   insulin aspart 100 UNIT/ML injection Commonly known as: novoLOG Inject 5 Units into the skin 3 (three) times daily with meals.   ipratropium-albuterol 0.5-2.5 (3) MG/3ML Soln Commonly known as: DUONEB Take 3 mLs by nebulization every 4 (four) hours as needed (shortness of breath or wheezing).   metFORMIN 500 MG tablet Commonly known as: GLUCOPHAGE Take 250 mg by mouth every morning.   metoprolol succinate 25 MG 24 hr tablet Commonly known as: TOPROL-XL Take 0.5 tablets (12.5 mg total) by mouth daily. Start taking on: February 21, 2020   midodrine 10 MG tablet Commonly known as: PROAMATINE Take 1 tablet (10 mg total) by mouth 3 (three) times daily.   nystatin powder Commonly known as: MYCOSTATIN/NYSTOP Apply 1 application topically every morning. Apply to scrotum   omeprazole  20 MG capsule Commonly known as: PRILOSEC Take 20 mg by mouth daily before breakfast.   OVER THE COUNTER MEDICATION Take 1 tablet by mouth in the morning and at bedtime. Calcium carbonate 300 mg/ vitamin d3 800 units plus mag ox/cop/mang/zn   oxybutynin 5 MG tablet Commonly known as: DITROPAN Take 1 tablet (5 mg total) by mouth 3 (three) times daily.   polyethylene glycol 17 g packet Commonly known as: MIRALAX / GLYCOLAX Take 17 g by mouth daily as needed for moderate constipation or severe constipation.   senna-docusate 8.6-50 MG tablet Commonly known as: Senokot-S Take 2 tablets by mouth at bedtime as needed for mild constipation or moderate constipation.   tamsulosin 0.4 MG Caps capsule Commonly known as: FLOMAX Take 1 capsule (0.4 mg total) by mouth daily after breakfast.   Xarelto 20  MG Tabs tablet Generic drug: rivaroxaban TAKE 1 TABLET BY MOUTH EVERY DAY WITH LUNCH What changed: See the new instructions.            Discharge Care Instructions  (From admission, onward)         Start     Ordered   02/20/20 0000  Discharge wound care:       Comments: LLQ end colostomy Stomal assessment/size: pouch intact Peristomal assessment: 1 and 1/4 inch round stoma Treatment options for stomal/peristomal skin: skin barrier ring Output:soft brown stool in a closed end pouch Ostomy pouching: 2pc. 2 and 3/4 inch pouching system.  We will change to a drainable (able to empty) pouch while here. Pouch is [employer], skin barrier is [name]  #2, skin barrier ring is lawson # [id number]. Education provided: None Enrolled patient in [place] program: No. Patient is dependent in ostomy care.  Chronic Stage 4 pressure injury Wound type:Pressure Pressure Injury POA: Yes/No/NA Measurement: 4cm x 0.4cm x 0.2cm Wound EUM:PNTI pink, moist Drainage (amount, consistency, odor) mucus, light yellow Periwound:with evidence of previous wound healing, scar tissue Dressing  procedure/placement/frequency: I will provide a mattress replacement and bilateral heel boots for pressure injury prevention. Wound care will be with a antimicrobial nonadherent (xeroform) gauze topped with a dry gauze and ABD pad changed twice daily. Patient is to be turned side to side and time in the supine position minimized.   02/20/20 1554          Follow-up Information    [name], MD. Schedule an appointment as soon as possible for a visit in 1 week(s).   Specialty: Family Medicine Contact information: [place] [address] [phone]        Minna Merritts, MD .   Specialty: Cardiology Contact information: [place] [address] [phone]              Allergies  Allergen Reactions  . Iodine Hives, Shortness Of Breath and Swelling  . Shrimp [Shellfish Allergy] Hives, Shortness Of Breath and Swelling  . Tetracycline Rash    You were cared for by a hospitalist during your hospital stay. If you have any questions about your discharge medications or the care you received while you were in the hospital after you are discharged, you can call the unit and asked to speak with the hospitalist on call if the hospitalist that took care of you is not available. Once you are discharged, your primary care physician will handle any further medical issues. Please note that no refills for any discharge medications will be authorized once you are discharged, as it is imperative that you return to your primary care physician (or establish a relationship with a primary care physician if you do not have one) for your aftercare needs so that they can reassess your need for medications and monitor your lab values.   Procedures/Studies: DG Chest 2 View  Result Date: 02/16/2020 CLINICAL DATA:  generalized weakness and SOB over last 2 days. EXAM: CHEST - 2 VIEW COMPARISON:  Chest radiograph 10/31/2019 FINDINGS: Stable cardiomediastinal  contours with enlarged heart size. Left chest pacer remains in place. There is central vascular congestion. There is haziness of the interstitium concerning for mild edema. New consolidation at the left lung base. There are mild opacities at the right lung base favored to represent atelectasis. No pneumothorax. No acute finding in the visualized skeleton. IMPRESSION: 1. New consolidation at the left  lung base likely a combination of atelectasis and moderate-sized pleural effusion. Infection not excluded. 2.  Right basilar atelectasis and probable small effusion. 3. Cardiomegaly with central vascular congestion and haziness of the interstitium concerning for mild edema. Electronically Signed   By: Audie Pinto M.D.   On: 02/16/2020 14:07   CT CHEST WO CONTRAST  Result Date: 02/16/2020 CLINICAL DATA:  Generalized weakness and shortness of breath for the past 2 days. Bilateral pleural effusions and airspace opacity on chest radiographs earlier today. EXAM: CT CHEST WITHOUT CONTRAST TECHNIQUE: Multidetector CT imaging of the chest was performed following the standard protocol without IV contrast. COMPARISON:  Chest radiographs earlier today and previously. Chest CT dated 03/06/2019 FINDINGS: Cardiovascular: Enlarged heart with right Atheromatous calcifications, including the coronary arteries and aorta. Small pericardial effusion with a maximum thickness of 1.3 cm. Ventricular and right atrial AICD and pacer leads respectively. Mediastinum/Nodes: No enlarged mediastinal or axillary lymph nodes. Thyroid gland, trachea, and esophagus demonstrate no significant findings. Lungs/Pleura: Moderate-sized left pleural effusion and small right pleural effusion. Bilateral compressive atelectasis, greater on the left. Interval mild patchy tree in bud opacities throughout the anterior portions of the mid and upper right upper lobe. Upper Abdomen: Multiple small calcified gallstones in the gallbladder measuring up to 5 mm in  maximum diameter each. No gallbladder wall thickening or pericholecystic fluid. Small amount of free peritoneal fluid in the upper abdomen. Atheromatous arterial calcifications. Musculoskeletal: Thoracic and lower cervical spine degenerative changes and changes of DISH. Pectus carinatum deformity. IMPRESSION: C 1. Interval mild patchy tree in bud opacities throughout the anterior portions of the mid and upper right upper lobe, compatible with infection. 2. Moderate-sized left pleural effusion and small right pleural effusion. 3. Bilateral compressive atelectasis, greater on the left. 4. Small pericardial effusion. 5. Cholelithiasis. 6. Small amount of free peritoneal fluid in the upper abdomen. 7. Cardiomegaly. 8.  Calcific coronary artery and aortic atherosclerosis. Aortic Atherosclerosis (ICD10-I70.0). Electronically Signed   By: Claudie Revering M.D.   On: 02/16/2020 16:56   DG Chest Port 1 View  Result Date: 02/20/2020 CLINICAL DATA:  Shortness of breath. EXAM: PORTABLE CHEST 1 VIEW COMPARISON:  CT 02/16/2020.  Chest x-ray 02/16/2020. FINDINGS: Cardiac pacer stable position. Stable cardiomegaly. Bibasilar atelectasis. Bilateral pulmonary infiltrates/edema again noted with slight improvement from prior exam. Small left pleural effusion again noted. No pneumothorax. IMPRESSION: 1.  Cardiac pacer stable position.  Stable cardiomegaly. 2. Bibasilar atelectasis. Bilateral pulmonary infiltrates/edema again noted with slight improvement from prior exam. Small left pleural effusion again noted. Electronically Signed   By: Marcello Moores  Register   On: 02/20/2020 08:18   ECHOCARDIOGRAM COMPLETE  Result Date: 02/17/2020    ECHOCARDIOGRAM REPORT   Patient Name:   NORBERTO WISHON Date of Exam: 02/17/2020 Medical Rec #:  [id number]        Height:       71.0 in Accession #:    [id number]       Weight:       226.4 lb Date of Birth:  09/15/1950        BSA:          2.223 m Patient Age:    30 years         BP:           116/75 mmHg  Patient Gender: M                HR:           67 bpm.  Exam Location:  Inpatient Procedure: 2D Echo, Cardiac Doppler, Color Doppler and Intracardiac            Opacification Agent Indications:    CHF-Acute Systolic 008.67 / Y19.50  History:        Patient has prior history of Echocardiogram examinations, most                 recent 09/28/2019. CHF and Cardiomyopathy, CAD, Arrythmias:Atrial                 Fibrillation, Atrial Flutter and non-specific ST changes,                 Signs/Symptoms:Syncope and Chest Pain; Risk                 Factors:Hypertension, Diabetes, Dyslipidemia and Non-Smoker.                 GERD. Elevated troponin.  Sonographer:    Vickie Epley RDCS Referring Phys: [id number] Lequita Halt  Sonographer Comments: Image acquisition challenging due to patient body habitus. IMPRESSIONS  1. Left ventricular ejection fraction, by estimation, is 25 to 30%. The left ventricle has severely decreased function. The left ventricle demonstrates regional wall motion abnormalities (see scoring diagram/findings for description). Left ventricular diastolic function could not be evaluated. There is severe hypokinesis of the left ventricular, mid-apical anteroseptal wall. There is hypokinesis of the left ventricular, apical anteroseptal wall, inferoseptal wall, anterior wall and anterolateral wall.  2. Right ventricular systolic function is moderately reduced. The right ventricular size is not well visualized.  3. The mitral valve is grossly normal. Trivial mitral valve regurgitation. No evidence of mitral stenosis.  4. The aortic valve was not well visualized. Aortic valve regurgitation is not visualized. No aortic stenosis is present.  5. The inferior vena cava is dilated in size with <50% respiratory variability, suggesting right atrial pressure of 15 mmHg. Conclusion(s)/Recommendation(s): EF similar to prior. Small pericardial and pleural effusions noted. FINDINGS  Left Ventricle: Left ventricular ejection  fraction, by estimation, is 25 to 30%. The left ventricle has severely decreased function. The left ventricle demonstrates regional wall motion abnormalities. Severe hypokinesis of the left ventricular, mid-apical anteroseptal wall. Definity contrast agent was given IV to delineate the left ventricular endocardial borders. The left ventricular internal cavity size was normal in size. There is no left ventricular hypertrophy. Left ventricular diastolic function could not be evaluated. Right Ventricle: The right ventricular size is not well visualized. Right vetricular wall thickness was not assessed. Right ventricular systolic function is moderately reduced. Left Atrium: Left atrial size was not well visualized. Right Atrium: Right atrial size was not well visualized. Pericardium: A small pericardial effusion is present. The pericardial effusion is circumferential. Mitral Valve: The mitral valve is grossly normal. Mild mitral annular calcification. Trivial mitral valve regurgitation. No evidence of mitral valve stenosis. Tricuspid Valve: The tricuspid valve is grossly normal. Tricuspid valve regurgitation is trivial. No evidence of tricuspid stenosis. Aortic Valve: The aortic valve was not well visualized. Aortic valve regurgitation is not visualized. No aortic stenosis is present. Pulmonic Valve: The pulmonic valve was not well visualized. Pulmonic valve regurgitation is not visualized. Aorta: The aortic root is normal in size and structure. Venous: The inferior vena cava is dilated in size with less than 50% respiratory variability, suggesting right atrial pressure of 15 mmHg. IAS/Shunts: The atrial septum is grossly normal. Additional Comments: There is a small pleural effusion in both left and right lateral regions.  LEFT  VENTRICLE PLAX 2D LVOT diam:     2.30 cm LV SV:         60 LV SV Index:   27 LVOT Area:     4.15 cm  LV Volumes (MOD) LV vol d, MOD A2C: 178.0 ml LV vol d, MOD A4C: 203.0 ml LV vol s, MOD A2C:  141.0 ml LV vol s, MOD A4C: 105.0 ml LV SV MOD A2C:     37.0 ml LV SV MOD A4C:     203.0 ml LV SV MOD BP:      77.8 ml RIGHT VENTRICLE TAPSE (M-mode): 1.3 cm LEFT ATRIUM           Index       RIGHT ATRIUM           Index LA Vol (A2C): 59.1 ml 26.59 ml/m RA Area:     13.60 cm LA Vol (A4C): 42.9 ml 19.30 ml/m RA Volume:   28.90 ml  13.00 ml/m  AORTIC VALVE LVOT Vmax:   81.60 cm/s LVOT Vmean:  54.600 cm/s LVOT VTI:    0.145 m  SHUNTS Systemic VTI:  0.14 m Systemic Diam: 2.30 cm Buford Dresser MD Electronically signed by Buford Dresser MD Signature Date/Time: 02/17/2020/1:36:01 PM    Final       The results of significant diagnostics from this hospitalization (including imaging, microbiology, ancillary and laboratory) are listed below for reference.     Microbiology: Recent Results (from the past 240 hour(s))  Urine culture     Status: Abnormal   Collection Time: 02/16/20  2:19 PM   Specimen: Urine, Random  Result Value Ref Range Status   Specimen Description URINE, RANDOM  Final   Special Requests   Final    NONE Performed at [facility], [address], [place], [place] [address]    Culture (A)  Final    >=100,000 COLONIES/mL MULTIPLE SPECIES PRESENT, SUGGEST RECOLLECTION   Report Status 02/17/2020 FINAL  Final  SARS Coronavirus 2 by RT PCR (hospital order, performed in [facility] hospital lab) Nasopharyngeal Nasopharyngeal Swab     Status: None   Collection Time: 02/16/20  4:54 PM   Specimen: Nasopharyngeal Swab  Result Value Ref Range Status   SARS Coronavirus 2 NEGATIVE NEGATIVE Final    Comment: (NOTE) SARS-CoV-2 target nucleic acids are NOT DETECTED.  The SARS-CoV-2 RNA is generally detectable in upper and lower respiratory specimens during the acute phase of infection. The lowest concentration of SARS-CoV-2 viral copies this assay can detect is 250 copies / mL. A negative result does not preclude SARS-CoV-2 infection and should not be used as the sole  basis for treatment or other patient management decisions.  A negative result may occur with improper specimen collection / handling, submission of specimen other than nasopharyngeal swab, presence of viral mutation(s) within the areas targeted by this assay, and inadequate number of viral copies (<250 copies / mL). A negative result must be combined with clinical observations, patient history, and epidemiological information.  Fact Sheet for Patients:   StrictlyIdeas.no  Fact Sheet for Healthcare Providers: BankingDealers.co.za  This test is not yet approved or  cleared by the [place] FDA and has been authorized for detection and/or diagnosis of SARS-CoV-2 by FDA under an Emergency Use Authorization (EUA).  This EUA will remain in effect (meaning this test can be used) for the duration of the COVID-19 declaration under Section 564(b)(1) of the Act, 21 U.S.C. section 360bbb-3(b)(1), unless the authorization is terminated  or revoked sooner.  Performed at [facility], [facility] [address], [place], [place] [address]   Urine Culture     Status: Abnormal (Preliminary result)   Collection Time: 02/18/20  4:03 PM   Specimen: Urine, Random  Result Value Ref Range Status   Specimen Description URINE, RANDOM  Final   Special Requests NONE  Final   Culture (A)  Final    >=100,000 COLONIES/mL STAPHYLOCOCCUS AUREUS SUSCEPTIBILITIES TO FOLLOW Performed at [facility], [place] [address], [place],  [address]    Report Status PENDING  Incomplete     Labs: BNP (last 3 results) Recent Labs    10/26/19 0716 02/16/20 1419 02/20/20 0456  BNP 558.1* 914.0* 786.7*   Basic Metabolic Panel: Recent Labs  Lab 02/16/20 1419 02/17/20 0545 02/18/20 0555 02/19/20 0708 02/20/20 0456  NA 137 141 145 144 142  K 2.9* 3.0* 3.7 3.5 4.5  CL 92* 92* 92* 94* 92*  CO2 35* 41* 42* 41* 41*  GLUCOSE 130* 98 93 87 89  BUN 12 11 11 15 17    CREATININE 0.90 0.89 0.96 1.03 1.01  CALCIUM 8.2* 8.3* 8.6* 8.3* 8.4*  MG  --   --   --   --  1.9   Liver Function Tests: Recent Labs  Lab 02/16/20 1419  AST 23  ALT 13  ALKPHOS 192*  BILITOT 0.8  PROT 6.7  ALBUMIN 2.6*   No results for input(s): LIPASE, AMYLASE in the last 168 hours. No results for input(s): AMMONIA in the last 168 hours. CBC: Recent Labs  Lab 02/16/20 1419 02/17/20 0545 02/19/20 0708 02/20/20 0456  WBC 10.4 8.4 8.2 7.9  NEUTROABS 9.0* 6.8  --   --   HGB 9.6* 9.4* 8.4* 9.3*  HCT 32.4* 31.5* 29.2* 32.1*  MCV 86.4 85.6 87.4 87.7  PLT 225 234 231 256   Cardiac Enzymes: No results for input(s): CKTOTAL, CKMB, CKMBINDEX, TROPONINI in the last 168 hours. BNP: Invalid input(s): POCBNP CBG: Recent Labs  Lab 02/19/20 1145 02/19/20 1626 02/19/20 2111 02/20/20 0621 02/20/20 1209  GLUCAP 83 110* 138* 80 94   D-Dimer No results for input(s): DDIMER in the last 72 hours. Hgb A1c No results for input(s): HGBA1C in the last 72 hours. Lipid Profile No results for input(s): CHOL, HDL, LDLCALC, TRIG, CHOLHDL, LDLDIRECT in the last 72 hours. Thyroid function studies Recent Labs    02/19/20 1844  TSH 6.320*   Anemia work up No results for input(s): VITAMINB12, FOLATE, FERRITIN, TIBC, IRON, RETICCTPCT in the last 72 hours. Urinalysis    Component Value Date/Time   COLORURINE AMBER (A) 02/18/2020 1620   APPEARANCEUR CLOUDY (A) 02/18/2020 1620   LABSPEC 1.018 02/18/2020 1620   PHURINE 7.0 02/18/2020 1620   GLUCOSEU 50 (A) 02/18/2020 1620   HGBUR MODERATE (A) 02/18/2020 1620   BILIRUBINUR NEGATIVE 02/18/2020 1620   KETONESUR NEGATIVE 02/18/2020 1620   PROTEINUR >=300 (A) 02/18/2020 1620   UROBILINOGEN 1.0 11/27/2013 1017   NITRITE NEGATIVE 02/18/2020 1620   LEUKOCYTESUR MODERATE (A) 02/18/2020 1620   Sepsis Labs Invalid input(s): PROCALCITONIN,  WBC,  LACTICIDVEN Microbiology Recent Results (from the past 240 hour(s))  Urine culture      Status: Abnormal   Collection Time: 02/16/20  2:19 PM   Specimen: Urine, Random  Result Value Ref Range Status   Specimen Description URINE, RANDOM  Final   Special Requests   Final    NONE Performed at [facility], [place]  [address] [place], [place] [address]    Culture (A)  Final    >=100,000 COLONIES/mL MULTIPLE SPECIES PRESENT, SUGGEST RECOLLECTION   Report Status 02/17/2020 FINAL  Final  SARS Coronavirus 2 by RT PCR (hospital order, performed in [facility] hospital lab) Nasopharyngeal Nasopharyngeal Swab     Status: None   Collection Time: 02/16/20  4:54 PM   Specimen: Nasopharyngeal Swab  Result Value Ref Range Status   SARS Coronavirus 2 NEGATIVE NEGATIVE Final    Comment: (NOTE) SARS-CoV-2 target nucleic acids are NOT DETECTED.  The SARS-CoV-2 RNA is generally detectable in upper and lower respiratory specimens during the acute phase of infection. The lowest concentration of SARS-CoV-2 viral copies this assay can detect is 250 copies / mL. A negative result does not preclude SARS-CoV-2 infection and should not be used as the sole basis for treatment or other patient management decisions.  A negative result may occur with improper specimen collection / handling, submission of specimen other than nasopharyngeal swab, presence of viral mutation(s) within the areas targeted by this assay, and inadequate number of viral copies (<250 copies / mL). A negative result must be combined with clinical observations, patient history, and epidemiological information.  Fact Sheet for Patients:   StrictlyIdeas.no  Fact Sheet for Healthcare Providers: BankingDealers.co.za  This test is not yet approved or  cleared by the [place] FDA and has been authorized for detection and/or diagnosis of SARS-CoV-2 by FDA under an Emergency Use Authorization (EUA).  This EUA will remain in effect (meaning this test can be used) for the  duration of the COVID-19 declaration under Section 564(b)(1) of the Act, 21 U.S.C. section 360bbb-3(b)(1), unless the authorization is terminated or revoked sooner.  Performed at [facility], [place] [address] [place], [place] [address]   Urine Culture     Status: Abnormal (Preliminary result)   Collection Time: 02/18/20  4:03 PM   Specimen: Urine, Random  Result Value Ref Range Status   Specimen Description URINE, RANDOM  Final   Special Requests NONE  Final   Culture (A)  Final    >=100,000 COLONIES/mL STAPHYLOCOCCUS AUREUS SUSCEPTIBILITIES TO FOLLOW Performed at [facility], [place] [address], [place],  [address]    Report Status PENDING  Incomplete     Time coordinating discharge:  I have spent 35 minutes face to face with the patient and on the ward discussing the patients care, assessment, plan and disposition with other care givers. >50% of the time was devoted counseling the patient about the risks and benefits of treatment/Discharge disposition and coordinating care.   SIGNED:   Damita Lack, MD  [facility] 02/20/2020, 3:54 PM   If 7PM-7AM, please contact night-coverage

## 2020-02-21 LAB — URINE CULTURE: Culture: >=100000 — AB

## 2020-05-16 ENCOUNTER — Emergency Department (HOSPITAL_COMMUNITY): Payer: Medicare Other

## 2020-05-16 ENCOUNTER — Emergency Department (HOSPITAL_COMMUNITY)
Admission: EM | Admit: 2020-05-16 | Discharge: 2020-06-01 | Disposition: E | Payer: Medicare Other | Attending: Emergency Medicine | Admitting: Emergency Medicine

## 2020-05-16 ENCOUNTER — Encounter (HOSPITAL_COMMUNITY): Payer: Self-pay

## 2020-05-16 ENCOUNTER — Other Ambulatory Visit: Payer: Self-pay

## 2020-05-16 DIAGNOSIS — R0902 Hypoxemia: Secondary | ICD-10-CM | POA: Insufficient documentation

## 2020-05-16 DIAGNOSIS — E1169 Type 2 diabetes mellitus with other specified complication: Secondary | ICD-10-CM | POA: Insufficient documentation

## 2020-05-16 DIAGNOSIS — I110 Hypertensive heart disease with heart failure: Secondary | ICD-10-CM | POA: Diagnosis not present

## 2020-05-16 DIAGNOSIS — Z7901 Long term (current) use of anticoagulants: Secondary | ICD-10-CM | POA: Diagnosis not present

## 2020-05-16 DIAGNOSIS — J80 Acute respiratory distress syndrome: Secondary | ICD-10-CM | POA: Diagnosis present

## 2020-05-16 DIAGNOSIS — R000 Tachycardia, unspecified: Secondary | ICD-10-CM | POA: Diagnosis not present

## 2020-05-16 DIAGNOSIS — I2510 Atherosclerotic heart disease of native coronary artery without angina pectoris: Secondary | ICD-10-CM | POA: Insufficient documentation

## 2020-05-16 DIAGNOSIS — Z955 Presence of coronary angioplasty implant and graft: Secondary | ICD-10-CM | POA: Insufficient documentation

## 2020-05-16 DIAGNOSIS — Z20822 Contact with and (suspected) exposure to covid-19: Secondary | ICD-10-CM | POA: Diagnosis not present

## 2020-05-16 DIAGNOSIS — Z8616 Personal history of COVID-19: Secondary | ICD-10-CM | POA: Insufficient documentation

## 2020-05-16 DIAGNOSIS — J189 Pneumonia, unspecified organism: Secondary | ICD-10-CM | POA: Insufficient documentation

## 2020-05-16 DIAGNOSIS — A419 Sepsis, unspecified organism: Secondary | ICD-10-CM | POA: Diagnosis not present

## 2020-05-16 DIAGNOSIS — Z7984 Long term (current) use of oral hypoglycemic drugs: Secondary | ICD-10-CM | POA: Diagnosis not present

## 2020-05-16 DIAGNOSIS — Z794 Long term (current) use of insulin: Secondary | ICD-10-CM | POA: Diagnosis not present

## 2020-05-16 DIAGNOSIS — Z79899 Other long term (current) drug therapy: Secondary | ICD-10-CM | POA: Diagnosis not present

## 2020-05-16 DIAGNOSIS — R4182 Altered mental status, unspecified: Secondary | ICD-10-CM | POA: Diagnosis not present

## 2020-05-16 DIAGNOSIS — I5022 Chronic systolic (congestive) heart failure: Secondary | ICD-10-CM | POA: Diagnosis not present

## 2020-05-16 LAB — CBC WITH DIFFERENTIAL/PLATELET
Abs Immature Granulocytes: 0.08 10*3/uL — ABNORMAL HIGH (ref 0.00–0.07)
Basophils Absolute: 0 10*3/uL (ref 0.0–0.1)
Basophils Relative: 0 %
Eosinophils Absolute: 0 10*3/uL (ref 0.0–0.5)
Eosinophils Relative: 0 %
HCT: 38.5 % — ABNORMAL LOW (ref 39.0–52.0)
Hemoglobin: 11.4 g/dL — ABNORMAL LOW (ref 13.0–17.0)
Immature Granulocytes: 1 %
Lymphocytes Relative: 5 %
Lymphs Abs: 0.7 10*3/uL (ref 0.7–4.0)
MCH: 26.1 pg (ref 26.0–34.0)
MCHC: 29.6 g/dL — ABNORMAL LOW (ref 30.0–36.0)
MCV: 88.3 fL (ref 80.0–100.0)
Monocytes Absolute: 0.6 10*3/uL (ref 0.1–1.0)
Monocytes Relative: 5 %
Neutro Abs: 11.5 10*3/uL — ABNORMAL HIGH (ref 1.7–7.7)
Neutrophils Relative %: 89 %
Platelets: 284 10*3/uL (ref 150–400)
RBC: 4.36 MIL/uL (ref 4.22–5.81)
RDW: 17.9 % — ABNORMAL HIGH (ref 11.5–15.5)
WBC: 12.8 10*3/uL — ABNORMAL HIGH (ref 4.0–10.5)
nRBC: 0 % (ref 0.0–0.2)

## 2020-05-16 LAB — PROTIME-INR
INR: 1.5 — ABNORMAL HIGH (ref 0.8–1.2)
Prothrombin Time: 17.3 seconds — ABNORMAL HIGH (ref 11.4–15.2)

## 2020-05-16 LAB — COMPREHENSIVE METABOLIC PANEL
ALT: 158 U/L — ABNORMAL HIGH (ref 0–44)
AST: 496 U/L — ABNORMAL HIGH (ref 15–41)
Albumin: 3 g/dL — ABNORMAL LOW (ref 3.5–5.0)
Alkaline Phosphatase: 295 U/L — ABNORMAL HIGH (ref 38–126)
Anion gap: 10 (ref 5–15)
BUN: 27 mg/dL — ABNORMAL HIGH (ref 8–23)
CO2: 31 mmol/L (ref 22–32)
Calcium: 8.8 mg/dL — ABNORMAL LOW (ref 8.9–10.3)
Chloride: 101 mmol/L (ref 98–111)
Creatinine, Ser: 1.27 mg/dL — ABNORMAL HIGH (ref 0.61–1.24)
GFR, Estimated: 57 mL/min — ABNORMAL LOW (ref >60)
Glucose, Bld: 155 mg/dL — ABNORMAL HIGH (ref 70–99)
Potassium: 5.1 mmol/L (ref 3.5–5.1)
Sodium: 142 mmol/L (ref 135–145)
Total Bilirubin: 0.9 mg/dL (ref 0.3–1.2)
Total Protein: 7.2 g/dL (ref 6.5–8.1)

## 2020-05-16 LAB — CBG MONITORING, ED: Glucose-Capillary: 149 mg/dL — ABNORMAL HIGH (ref 70–99)

## 2020-05-16 LAB — URINALYSIS, ROUTINE W REFLEX MICROSCOPIC
Bilirubin Urine: NEGATIVE
Glucose, UA: NEGATIVE mg/dL
Ketones, ur: NEGATIVE mg/dL
Nitrite: NEGATIVE
Protein, ur: >300 mg/dL — AB
Specific Gravity, Urine: 1.02 (ref 1.005–1.030)
pH: 7 (ref 5.0–8.0)

## 2020-05-16 LAB — URINALYSIS, MICROSCOPIC (REFLEX): WBC, UA: >50 WBC/hpf (ref 0–5)

## 2020-05-16 LAB — RESPIRATORY PANEL BY RT PCR (FLU A&B, COVID)
Influenza A by PCR: NEGATIVE
Influenza B by PCR: NEGATIVE
SARS Coronavirus 2 by RT PCR: NEGATIVE

## 2020-05-16 LAB — BRAIN NATRIURETIC PEPTIDE: B Natriuretic Peptide: 2506.6 pg/mL — ABNORMAL HIGH (ref 0.0–100.0)

## 2020-05-16 LAB — LACTIC ACID, PLASMA: Lactic Acid, Venous: 1.8 mmol/L (ref 0.5–1.9)

## 2020-05-16 LAB — APTT: aPTT: 37 seconds — ABNORMAL HIGH (ref 24–36)

## 2020-05-16 MED ORDER — SODIUM CHLORIDE 0.9 % IV SOLN
2.0000 g | Freq: Once | INTRAVENOUS | Status: AC
  Administered 2020-05-16: 2 g via INTRAVENOUS
  Filled 2020-05-16: qty 2

## 2020-05-16 MED ORDER — VANCOMYCIN HCL IN DEXTROSE 1-5 GM/200ML-% IV SOLN
1000.0000 mg | Freq: Once | INTRAVENOUS | Status: DC
  Filled 2020-05-16: qty 200

## 2020-05-16 MED ORDER — SODIUM CHLORIDE 0.9 % IV SOLN: 2.0000 g | Freq: Three times a day (TID) | INTRAVENOUS | Status: DC

## 2020-05-16 MED ORDER — LACTATED RINGERS IV SOLN: INTRAVENOUS | Status: DC

## 2020-05-16 MED ORDER — METRONIDAZOLE IN NACL 5-0.79 MG/ML-% IV SOLN
500.0000 mg | Freq: Once | INTRAVENOUS | Status: AC
  Administered 2020-05-16: 500 mg via INTRAVENOUS
  Filled 2020-05-16: qty 100

## 2020-05-16 MED ORDER — FUROSEMIDE 10 MG/ML IJ SOLN: 40.0000 mg | Freq: Once | INTRAMUSCULAR | Status: DC

## 2020-05-16 MED ORDER — VANCOMYCIN HCL 2000 MG/400ML IV SOLN
2000.0000 mg | Freq: Once | INTRAVENOUS | Status: AC
  Administered 2020-05-16: 2000 mg via INTRAVENOUS
  Filled 2020-05-16: qty 400

## 2020-05-16 MED ORDER — VANCOMYCIN HCL 750 MG/150ML IV SOLN
750.0000 mg | Freq: Two times a day (BID) | INTRAVENOUS | Status: DC
  Filled 2020-05-16: qty 150

## 2020-05-21 LAB — CULTURE, BLOOD (ROUTINE X 2)
Culture: NO GROWTH
Culture: NO GROWTH
Special Requests: ADEQUATE

## 2020-06-01 NOTE — ED Notes (Signed)
Per [employer], hold body in morgue for possible tissue donation

## 2020-06-01 NOTE — ED Provider Notes (Signed)
Care transferred to me.  Patient is ill-appearing and on nonrebreather and nasal cannula and still hypoxic with increased work of breathing.  BiPAP considered and briefly tried but then patient quickly became extremely hypotensive and nearly apneic.  I was on the phone with his sister, [name], while this was occurring.  She was telling me that he definitely wants to be DNR/DNI and would not want aggressive measures but would be okay with IV antibiotics and IV medications.  We had discussed possible thoracentesis but she was hesitant about this.  I was then called to the bedside because of his change in status and he appears to be actively dying.  His blood pressure is now around 40 systolic and he is minimally breathing.  Because of his CODE STATUS, we will make sure he is comfortable during the dying process.  1603: Patient expired.  I discussed with his sister, [name], and Glenard Haring, and updated them.  Chaplain will call to assist with next arrangements. No indication this is an ME case   Sherwood Gambler, MD 06-10-2020 1737

## 2020-06-01 NOTE — ED Notes (Signed)
Pt noted to not have a palpable pulse at this time. MD placed magnet over pacemaker to deactivate.

## 2020-06-01 NOTE — ED Triage Notes (Signed)
Pt arrived via EMS from [facility] for c/o sepis. Pt is unresponsive per EMS, Pt is on NRB upon entry to ED room and minimally responsive. Pt has a MOST form stating he is a DNR. No information about whether pt is a DNI or not. EMS reports pt was called out for low O2 and "not breathing". Initial sats were 78&, EMS placed pt on 5L [place] and pt was 86%, EMS placed pt on NRB and O2 was 95%. EMS reports RR was 40 and then dropped to 3 and now pt is tachypneic. CBG 200, HR 110, last BP 100/70. EMS gave 227m NS

## 2020-06-01 NOTE — ED Provider Notes (Signed)
[facility] EMERGENCY DEPARTMENT Provider Note   CSN: [id number] Arrival date & time: 06/13/20  1347     History Chief Complaint  Patient presents with  . Code Sepsis  . Respiratory Distress    Colton Lamb is a 69 y.o. male.  LVL 5 caveat for AMS  The history is provided by medical records. The history is limited by the condition of the patient. No language interpreter was used.  Altered Mental Status Presenting symptoms: unresponsiveness   Severity:  Severe Most recent episode:  Today Episode history:  Single Timing:  Constant Progression:  Unchanged Chronicity:  New      Past Medical History:  Diagnosis Date  . Atrial flutter ([place])    a. s/p Cardioversion 11/22/13, on amiodarone and Xarelto.  . Chronic osteomyelitis ([place]) 06/30/2019   s/p colostomy  . Chronic systolic heart failure ([place])    a. 10/2013 EF 20-25%, grade III DD, RV mildly dilated and sys fx mild/mod reduced;  b. 01/2014 Echo: EF 30-35%, gr3 DD, mod dil LA. c. 07/2019 EF 20-25%  . Coronary artery disease    a. s/p MI 2007/2015;  b. s/p prior PCI to the LAD/LCX/PDA/PL;  c. 2008: s/p Cypher DES to the OM.  Marland Kitchen Crohn's ileocolitis ([place])   . GERD (gastroesophageal reflux disease)   . Hx of adenomatous colonic polyps 11/2003  . Hyperlipidemia   . Hypertension   . Ischemic cardiomyopathy    s. 01/2014 s/p MDT [id number] [name] XT DR single lead AICD.  Marland Kitchen Obesity   . Paroxysmal atrial fibrillation (HCC)    a. CHA2DS2VASc = (CHF, HTN, agex1, DM)  . Sleep apnea   . Syncope    a.  11/2013 in setting of volume depletion and bradycardia due to dig toxicity   . Type II diabetes mellitus [facility])     Patient Active Problem List   Diagnosis Date Noted  . Adult failure to thrive   . Weakness generalized   . DNR (do not resuscitate)   . CHF (congestive heart failure) ([place]) 02/16/2020  . Chest pain 10/26/2019  . Diarrhea 10/11/2019  . COVID-19 virus infection 10/11/2019  . Thrombocytopenia ([place])  10/11/2019  . Sacral decubitus ulcer, stage IV ([place]) 10/11/2019  . Encounter for competency evaluation   . Acute respiratory distress syndrome (ARDS) due to COVID-19 virus ([place]) 09/29/2019  . Bacteremia due to Pseudomonas 09/29/2019  . Goals of care, counseling/discussion   . Palliative care by specialist   . HCAP (healthcare-associated pneumonia) 09/23/2019  . Colostomy in place [facility]) 09/23/2019  . Bleeding 07/11/2019  . Chronic osteomyelitis ([place]) 06/30/2019  . PICC (peripherally inserted central catheter) flush   . Normocytic anemia 06/21/2019  . Decubitus ulcer of sacral region, unstageable ([place]) 06/19/2019  . Hypotension 06/19/2019  . Anemia   . Acute respiratory failure with hypoxia ([place]) 06/12/2019  . Chronic systolic CHF (congestive heart failure) ([place])   . Acute blood loss anemia   . Pressure injury of skin 03/17/2019  . Acute renal failure (ARF) ([place])   . Empyema lung ([place])   . Shortness of breath   . Pleural effusion on right   . Sepsis ([place]) 03/04/2019  . Restless legs syndrome (RLS) 06/16/2017  . Hypertension, essential 06/16/2017  . CAD in native artery 04/27/2017  . Hydrocephalus ([place]) 09/08/2016  . Dizziness and giddiness   . Elevated troponin 09/06/2016  . Type II diabetes mellitus ([place])   . Ischemic cardiomyopathy   . Hyponatremia 07/01/2015  . Diabetes  mellitus type 2, uncontrolled, with complications ([place]) 99/37/1696  . 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 OSA (obstructive sleep apnea) 12/20/2013  . Morbid obesity ([place]) 12/20/2013  . AKI (acute kidney injury) - Creatinine improved at d/c 12/14/2013  . Elevated TSH - will need f/u TFTs with PCP in 3-4 weeks 12/14/2013  . Junctional bradycardia - resolved 12/14/2013  . Syncope - due to bradycardia in setting of Digoxin Toxicity 12/11/2013  . Acute on chronic HFrEF (heart failure with reduced ejection fraction) ([place]) 12/06/2013  . At risk for sudden cardiac death - on LifeVest  12/14/2013  . Acute on chronic combined systolic and diastolic CHF (congestive heart failure) ([place]) 2013-12-14  . Cardiomyopathy, ischemic 12/14/2013  . Atrial flutter ([place]) 11/22/2013  . NSTEMI (non-ST elevated myocardial infarction) ([place]) 11/22/2013  . Long term current use of anticoagulant 10/19/2010  . Hyperlipidemia 05/06/2010  . Edema 05/06/2010  . CAD S/P percutaneous coronary angioplasty - multiple PCIs 11/11/2008  . Atrial fibrillation, chronic ([place]) 11/11/2008  . GERD 11/11/2008  . [place] INTESTINE 11/11/2008  . COLONIC POLYPS, HX OF 11/11/2008  . [place] ALLERGY 11/11/2008    Past Surgical History:  Procedure Laterality Date  . ATRIAL FLUTTER ABLATION N/A 04/16/2014   Procedure: ATRIAL FLUTTER ABLATION;  Surgeon: Evans Lance, MD;  Location: [facility] CATH LAB;  Service: Cardiovascular;  Laterality: N/A;  . CARDIAC CATHETERIZATION  10/2013  . CARDIAC DEFIBRILLATOR PLACEMENT  04/16/2014   Medtronic Evira device  . CARDIAC ELECTROPHYSIOLOGY STUDY AND ABLATION  04/16/2014   atrial flutter ablation  . CARDIOVERSION N/A 03/05/2014   Procedure: CARDIOVERSION;  Surgeon: Jolaine Artist, MD;  Location: [facility] ENDOSCOPY;  Service: Cardiovascular;  Laterality: N/A;  . CATARACT EXTRACTION W/PHACO Right 01/04/2017   Procedure: CATARACT EXTRACTION PHACO AND INTRAOCULAR LENS PLACEMENT ([place])  Right Diabetic Complicated;  Surgeon: Leandrew Koyanagi, MD;  Location: [facility];  Service: Ophthalmology;  Laterality: Right;  Diabetic  . CATARACT EXTRACTION W/PHACO Left 02/08/2017   Procedure: CATARACT EXTRACTION PHACO AND INTRAOCULAR LENS PLACEMENT ([place]) left diabetic;  Surgeon: Leandrew Koyanagi, MD;  Location: [facility];  Service: Ophthalmology;  Laterality: Left;  Diabetic - oral meds sleep apnea  . CORONARY ANGIOPLASTY WITH STENT PLACEMENT  2007; 2008 X 2   "1+1 ~ 1"  . FOOT SURGERY Left    bone spur  . HYDROCELE EXCISION Bilateral   . Ileocecal  resection and sigmoid enterocolonic fistula repair  09/1998  . IMPLANTABLE CARDIOVERTER DEFIBRILLATOR IMPLANT N/A 04/16/2014   Procedure: IMPLANTABLE CARDIOVERTER DEFIBRILLATOR IMPLANT;  Surgeon: Evans Lance, MD;  Location: [facility] CATH LAB;  Service: Cardiovascular;  Laterality: N/A;  . LEFT HEART CATHETERIZATION WITH CORONARY ANGIOGRAM N/A 11/22/2013   Procedure: LEFT HEART CATHETERIZATION WITH CORONARY ANGIOGRAM;  Surgeon: Sinclair Grooms, MD;  Location: [facility] CATH LAB;  Service: Cardiovascular;  Laterality: N/A;  . TRANSVERSE LOOP COLOSTOMY N/A 07/03/2019   Procedure: TRANSVERSE LOOP COLOSTOMY;  Surgeon: Ronny Bacon, MD;  Location: [facility] ORS;  Service: General;  Laterality: N/A;       Family History  Problem Relation Age of Onset  . Breast cancer Mother   . Heart disease Father   . Heart attack Father   . Colon cancer Neg Hx     Social History   Tobacco Use  . Smoking status: Never Smoker  . Smokeless tobacco: Never Used  Substance Use Topics  . Alcohol use: No  . Drug use: No    Home Medications Prior to  Admission medications   Medication Sig Start Date End Date Taking? Authorizing Provider  acetaminophen (TYLENOL) 325 MG tablet Take 1-2 tablets (325-650 mg total) by mouth every 4 (four) hours as needed for mild pain. Patient taking differently: Take 650 mg by mouth every 4 (four) hours as needed for mild pain.  04/17/14   [name], NP  Albuterol Sulfate 108 (90 Base) MCG/ACT AEPB Inhale 1 puff into the lungs every 6 (six) hours as needed (shortness of breath).     [provider]  atorvastatin (LIPITOR) 80 MG tablet TAKE 1 TABLET BY MOUTH EVERY DAY Patient taking differently: Take 80 mg by mouth at bedtime.  03/08/19   Minna Merritts, MD  balsalazide (COLAZAL) 750 MG capsule TAKE 1 CAPSULE (750 MG TOTAL) BY MOUTH 3 (THREE) TIMES DAILY. Patient taking differently: Take 750 mg by mouth 3 (three) times daily.  11/28/18   Ladene Artist, MD  digoxin (LANOXIN)  0.125 MG tablet Take 1 tablet (0.125 mg total) by mouth daily. 11/01/19   Mesner, Corene Cornea, MD  ferrous sulfate 325 (65 FE) MG EC tablet Take 325 mg by mouth every morning.     [provider]  finasteride (PROSCAR) 5 MG tablet Take 1 tablet (5 mg total) by mouth daily. 03/20/19   Loletha Grayer, MD  furosemide (LASIX) 40 MG tablet Take 1 tablet (40 mg total) by mouth daily. 02/20/20 03/21/20  Amin, Jeanella Flattery, MD  insulin aspart (NOVOLOG) 100 UNIT/ML injection Inject 5 Units into the skin 3 (three) times daily with meals. Patient not taking: Reported on 02/16/2020 03/19/19   Loletha Grayer, MD  ipratropium-albuterol (DUONEB) 0.5-2.5 (3) MG/3ML SOLN Take 3 mLs by nebulization every 4 (four) hours as needed (shortness of breath or wheezing).    [provider]  metFORMIN (GLUCOPHAGE) 500 MG tablet Take 250 mg by mouth every morning.  10/29/19   [provider]  metoprolol succinate (TOPROL-XL) 25 MG 24 hr tablet Take 0.5 tablets (12.5 mg total) by mouth daily. 02/21/20 03/22/20  Amin, Jeanella Flattery, MD  midodrine (PROAMATINE) 10 MG tablet Take 1 tablet (10 mg total) by mouth 3 (three) times daily. 04/26/19   Max Sane, MD  Multiple Vitamins-Minerals (DECUBI-VITE PO) Take 1 capsule by mouth daily.     [provider]  nystatin (MYCOSTATIN/NYSTOP) powder Apply 1 application topically every morning. Apply to scrotum    [provider]  omeprazole (PRILOSEC) 20 MG capsule Take 20 mg by mouth daily before breakfast.     [provider]  OVER THE COUNTER MEDICATION Take 1 tablet by mouth in the morning and at bedtime. Calcium carbonate 300 mg/ vitamin d3 800 units plus mag ox/cop/mang/zn    [provider]  oxybutynin (DITROPAN) 5 MG tablet Take 1 tablet (5 mg total) by mouth 3 (three) times daily. 02/20/20   Amin, Jeanella Flattery, MD  polyethylene glycol (MIRALAX / GLYCOLAX) 17 g packet Take 17 g by mouth daily as needed for moderate constipation or severe  constipation. 02/20/20   Amin, Jeanella Flattery, MD  saccharomyces boulardii (FLORASTOR) 250 MG capsule Take 250 mg by mouth 3 (three) times daily with meals.     [provider]  senna-docusate (SENOKOT-S) 8.6-50 MG tablet Take 2 tablets by mouth at bedtime as needed for mild constipation or moderate constipation. 02/20/20   Damita Lack, MD  tamsulosin (FLOMAX) 0.4 MG CAPS capsule Take 1 capsule (0.4 mg total) by mouth daily after breakfast. 03/20/19   Loletha Grayer,  MD  vitamin C (VITAMIN C) 500 MG tablet Take 1 tablet (500 mg total) by mouth 2 (two) times daily. Patient taking differently: Take 1,000 mg by mouth every morning.  07/09/19   [name], MD  XARELTO 20 MG TABS tablet TAKE 1 TABLET BY MOUTH EVERY DAY WITH LUNCH Patient taking differently: Take 20 mg by mouth daily with supper.  05/17/19   Minna Merritts, MD  ferrous gluconate (FERGON) 324 MG tablet Take 1 tablet (324 mg total) by mouth daily with breakfast. Patient not taking: Reported on 10/31/2019 10/24/19 11/01/19  Wyvonnia Dusky, MD  insulin glargine (LANTUS) 100 UNIT/ML injection Inject 5 Units into the skin daily.  11/01/19  [provider]    Allergies    Iodine, Shrimp [shellfish allergy], and Tetracycline  Review of Systems   Review of Systems  Unable to perform ROS: Mental status change  Respiratory: Positive for shortness of breath.     Physical Exam Updated Vital Signs BP 125/90   Pulse 90   Temp 97.8 F (36.6 C) (Rectal)   Resp (!) 32   Ht 5' 11"  (1.803 m)   Wt 97.6 kg   SpO2 (!) 78%   BMI 30.01 kg/m   Physical Exam Vitals and nursing note reviewed.  Constitutional:      General: He is in acute distress.     Appearance: He is well-developed. He is ill-appearing. He is not diaphoretic.  HENT:     Head: Normocephalic and atraumatic.     Right Ear: External ear normal.     Left Ear: External ear normal.     Nose: Nose normal.     Mouth/Throat:     Pharynx: No oropharyngeal  exudate or posterior oropharyngeal erythema.  Eyes:     Conjunctiva/sclera: Conjunctivae normal.     Pupils: Pupils are equal, round, and reactive to light.  Cardiovascular:     Rate and Rhythm: Regular rhythm. Tachycardia present.     Pulses: Normal pulses.  Pulmonary:     Effort: Respiratory distress present.     Breath sounds: No stridor. Rhonchi and rales present. No wheezing.  Chest:     Chest wall: No tenderness.  Abdominal:     General: Abdomen is flat. There is no distension.     Palpations: Abdomen is soft.     Tenderness: There is no abdominal tenderness. There is no right CVA tenderness, left CVA tenderness, guarding or rebound.  Musculoskeletal:        General: No tenderness.     Cervical back: Normal range of motion and neck supple.  Skin:    General: Skin is warm.     Capillary Refill: Capillary refill takes less than 2 seconds.     Coloration: Skin is not pale.     Findings: No erythema or rash.  Neurological:     Mental Status: He is unresponsive.     GCS: GCS eye subscore is 3. GCS verbal subscore is 2. GCS motor subscore is 4.     Motor: No abnormal muscle tone.     Comments: GCS of 9 on arrival.  Patient not answering questions but has eyes open to speech.      ED Results / Procedures / Treatments   Labs (all labs ordered are listed, but only abnormal results are displayed) Labs Reviewed  CBC WITH DIFFERENTIAL/PLATELET - Abnormal; Notable for the following components:      Result Value   WBC 12.8 (*)    Hemoglobin  11.4 (*)    HCT 38.5 (*)    MCHC 29.6 (*)    RDW 17.9 (*)    Neutro Abs 11.5 (*)    Abs Immature Granulocytes 0.08 (*)    All other components within normal limits  PROTIME-INR - Abnormal; Notable for the following components:   Prothrombin Time 17.3 (*)    INR 1.5 (*)    All other components within normal limits  URINALYSIS, ROUTINE W REFLEX MICROSCOPIC - Abnormal; Notable for the following components:   APPearance CLOUDY (*)    Hgb  urine dipstick LARGE (*)    Protein, ur >300 (*)    Leukocytes,Ua LARGE (*)    All other components within normal limits  APTT - Abnormal; Notable for the following components:   aPTT 37 (*)    All other components within normal limits  URINALYSIS, MICROSCOPIC (REFLEX) - Abnormal; Notable for the following components:   Bacteria, UA FEW (*)    All other components within normal limits  CBG MONITORING, ED - Abnormal; Notable for the following components:   Glucose-Capillary 149 (*)    All other components within normal limits  RESPIRATORY PANEL BY RT PCR (FLU A&B, COVID)  CULTURE, BLOOD (ROUTINE X 2)  CULTURE, BLOOD (ROUTINE X 2)  URINE CULTURE  LACTIC ACID, PLASMA  COMPREHENSIVE METABOLIC PANEL  LACTIC ACID, PLASMA  BRAIN NATRIURETIC PEPTIDE    EKG EKG Interpretation  Date/Time:  05-18-20 14:44:02 EDT Ventricular Rate:  107 PR Interval:    QRS Duration: 89 QT Interval:  455 QTC Calculation: 608 R Axis:   -71 Text Interpretation: Atrial fibrillation Left anterior fascicular block Abnormal lateral Q waves Anterior infarct, old Prolonged QT interval Confirmed by Sherwood Gambler [phone]) on 05/18/20 3:22:50 PM   Radiology DG Chest Portable 1 View  Result Date: 05/18/20 CLINICAL DATA:  Respiratory distress EXAM: PORTABLE CHEST 1 VIEW COMPARISON:  February 20, 2020 FINDINGS: There is complete opacification of the left hemidiaphragm. There is no appreciable shift of heart and mediastinum toward the left. There is ill-defined airspace opacity in the right base. There is cardiomegaly. Pacemaker leads are attached to the right atrium and right ventricle. Pulmonary vascularity on the right appears normal. Pulmonary vascularity on the left is obscured. No adenopathy seen to the right of midline. Left hemithorax region obscured by opacity. No bone lesions. IMPRESSION: Essentially complete opacification of the left hemithorax. Appearance is indicative of large left pleural  effusion. There may well be underlying atelectasis and/or consolidation on the left. There is patchy airspace opacity concerning for developing pneumonia right base. Cardiomegaly noted. Pacemaker leads attached to right atrium and right ventricle. Electronically Signed   By: Lowella Grip III M.D.   On: 18-May-2020 14:28    Procedures Procedures (including critical care time)  CRITICAL CARE Performed by: Gwenyth Allegra Neoma Uhrich Total critical care time: 35 minutes Critical care time was exclusive of separately billable procedures and treating other patients. Critical care was necessary to treat or prevent imminent or life-threatening deterioration. Critical care was time spent personally by me on the following activities: development of treatment plan with patient and/or surrogate as well as nursing, discussions with consultants, evaluation of patient's response to treatment, examination of patient, obtaining history from patient or surrogate, ordering and performing treatments and interventions, ordering and review of laboratory studies, ordering and review of radiographic studies, pulse oximetry and re-evaluation of patient's condition.   Medications Ordered in ED Medications  lactated ringers infusion ( Intravenous New Bag/Given  25-May-2020 1446)  metroNIDAZOLE (FLAGYL) IVPB 500 mg (500 mg Intravenous New Bag/Given 05/25/2020 1448)  vancomycin (VANCOREADY) IVPB 2000 mg/400 mL (2,000 mg Intravenous New Bag/Given 05/25/20 1522)  ceFEPIme (MAXIPIME) 2 g in sodium chloride 0.9 % 100 mL IVPB ( Intravenous Stopped 05/25/20 1517)    ED Course  I have reviewed the triage vital signs and the nursing notes.  Pertinent labs & imaging results that were available during my care of the patient were reviewed by me and considered in my medical decision making (see chart for details).    MDM Rules/Calculators/A&P                          CHASIN FINDLING is a 69 y.o. male who arrives with a MOST form  that he is DNR/DNI, CAD status post PCI, atrial fibrillation, hypertension, hyperlipidemia, CHF, colostomy, and prior COVID-19 infection earlier this year who presents with unresponsiveness and hypoxia.  According to EMS, patient did not have trauma but was found with minimal responsiveness today.  Patient was found to have hypoxia, tachycardia, tachypnea, and felt warm to the touch.  There was concern about possible sepsis and pneumonia.  Patient arrives on a nonrebreather to maintain oxygen saturations in the 80s.  On arrival, patient does not answer any questions and GCS is found to be between 8 and 9.  Patient has a MOST form which shows he is DNR and DNI so we will not intubate for airway protection and hypoxia management this time.  Patient is using a nonrebreather with nasal cannula augmenting oxygen.  His breath sounds were rhonchorous and crackly throughout worse on the left than right.  Chest and abdomen did not appear to be tender with no grimacing.  Back did not appear to be tender.  Patient would not move extremities for me but had palpable pulses.  Patient is very ill-appearing.  Rectal temperature was normal.  Clinically given the concern for possible pneumonia, we will get broad-spectrum antibiotics and make him a code sepsis.  As his history shows CHF and he does have rales on exam, will hold on a significant mount of fluids unless he becomes hypotensive.  We will call palliative care for involvement and recommendations as well.  Portable chest x-ray was obtained showing near complete opacification of the left side due to pleural effusion with possible atelectasis and consolidation.  No pneumothorax seen.  Given the report of no trauma, I suspect this is causing the hypoxia.  We will give broad-spectrum antibiotics and get labs.  Patient will need admission.  Covid test was ordered.  Anticipate reassessment after work-up.  Care transferred to Dr. Regenia Skeeter while awaiting work-up results  prior to admission.   Final Clinical Impression(s) / ED Diagnoses Final diagnoses:  Altered mental status, unspecified altered mental status type  Hypoxia  Sepsis due to pneumonia [facility])    Clinical Impression: 1. Altered mental status, unspecified altered mental status type   2. Hypoxia   3. Sepsis due to pneumonia [facility])     Disposition: Admit  This note was prepared with assistance of Dragon voice recognition software. Occasional wrong-word or sound-a-like substitutions may have occurred due to the inherent limitations of voice recognition software.     Albert Devaul, Gwenyth Allegra, MD 2020-05-25 1531

## 2020-06-01 NOTE — Progress Notes (Signed)
Patient ID: Colton Lamb, male   DOB: June 18, 1951, 69 y.o.   MRN: [id number]      Consult received. Patient has passed prior to being seen by PMT. He had a MOST on file that indicated DNR/DNI. I spoke with his sister [name] by phone to offer condolences and emotional support.   Elie Confer, NP-C Palliative Medicine   Please call Palliative Medicine team phone with any questions [phone]. For individual providers please see AMION.   No charge

## 2020-06-01 NOTE — Progress Notes (Signed)
Pharmacy Antibiotic Note  Colton Lamb is a 69 y.o. male admitted on 06-15-2020 with sepsis.  Pharmacy has been consulted for vancomycin and cefepime dosing.  Plan: Vancomycin 2000 mg IV x 1, then 750 mg IV every 12 hours (target vancomycin trough 15-20) Cefepime 2g IV every 8 hours Monitor renal function, Cx and clinical progression to narrow Vancomycin trough at steady state  Height: 5' 11"  (180.3 cm) Weight: 97.6 kg (215 lb 2.7 oz) IBW/kg (Calculated) : 75.3  Temp (24hrs), Avg:98.1 F (36.7 C), Min:97.8 F (36.6 C), Max:98.4 F (36.9 C)  Recent Labs  Lab June 15, 2020 1418  WBC 12.8*  LATICACIDVEN 1.8    CrCl cannot be calculated (Patient's most recent lab result is older than the maximum 21 days allowed.).    Allergies  Allergen Reactions  . Iodine Hives, Shortness Of Breath and Swelling  . Shrimp [Shellfish Allergy] Hives, Shortness Of Breath and Swelling  . Tetracycline Rash    Bertis Ruddy, PharmD Clinical Pharmacist ED Pharmacist Phone # [phone] 2020/06/15 3:07 PM

## 2020-06-01 NOTE — ED Notes (Signed)
Pt with irregular tachypneic breaths that sounds congested

## 2020-06-01 NOTE — ED Notes (Addendum)
Pt in asystole at this time. No pulse palpable. Notified Regenia Skeeter MD

## 2020-06-01 NOTE — ED Notes (Signed)
[place] MD at bedside to confirm death. TOD 1603

## 2020-06-01 DEATH — deceased

## 2020-10-23 IMAGING — CT CT HEAD WITHOUT CONTRAST
3 of 4 series · 15 of 47 positions shown, 18 images · non-contrast
Comparison: 09/07/2016

CLINICAL DATA: Altered level of consciousness.

EXAM:
CT HEAD WITHOUT CONTRAST
TECHNIQUE: Contiguous axial images were obtained from the base of the skull
through the vertex without intravenous contrast.

[Series 3: head wo · axial · 0.47mm/px · z∈[-169,-34]mm · 9 of 33 slices shown, 12 images]
[im 3/33  brain]
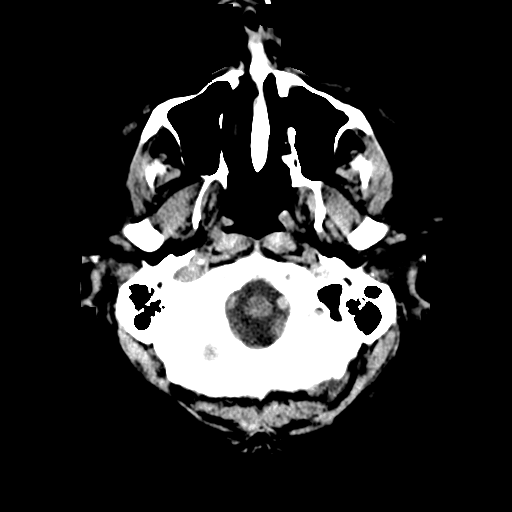
[im 3/33  bone]
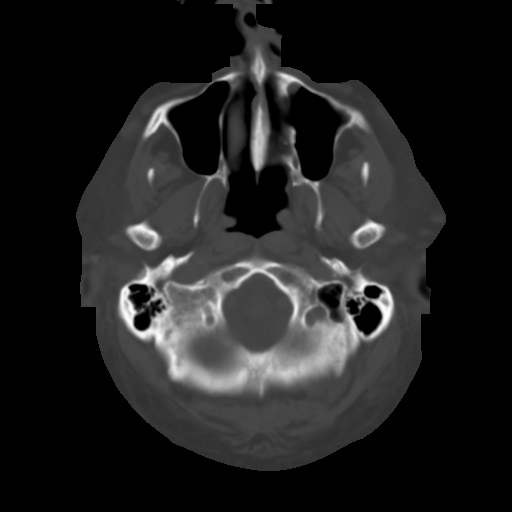
[im 7/33  brain]
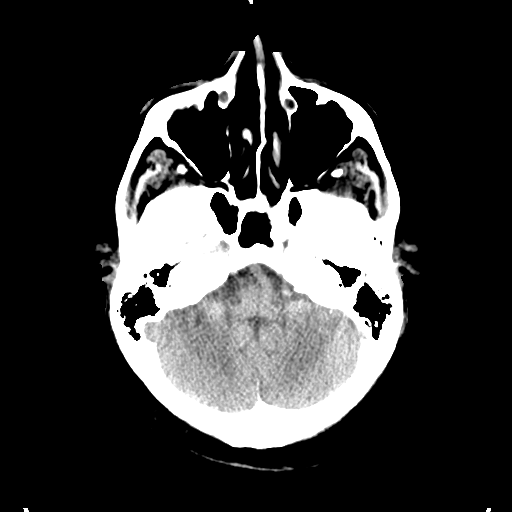
[im 10/33  brain]
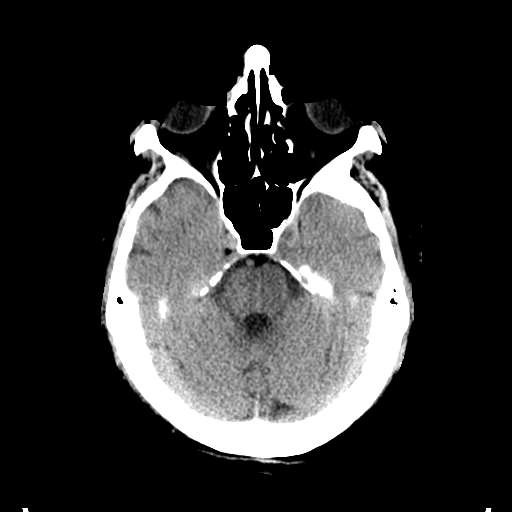
[im 14/33  brain]
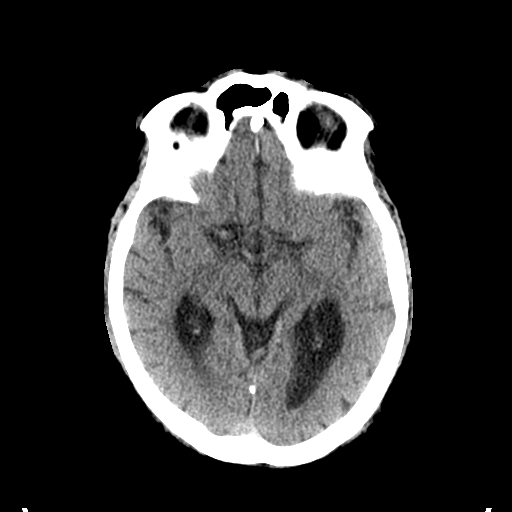
[im 17/33  brain]
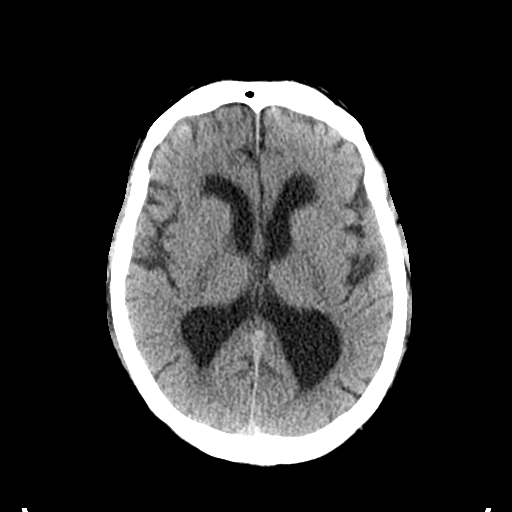
[im 17/33  bone]
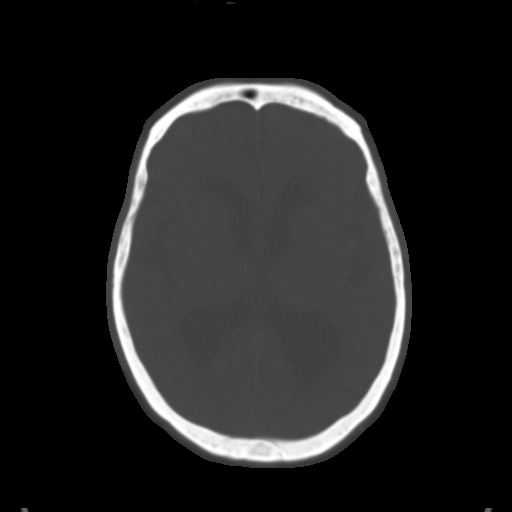
[im 19/33  brain]
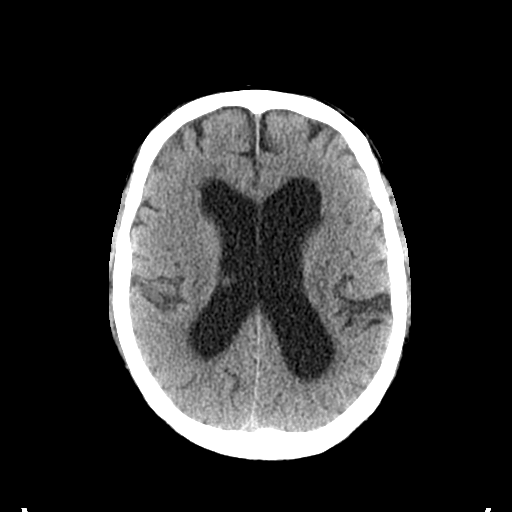
[im 23/33  brain]
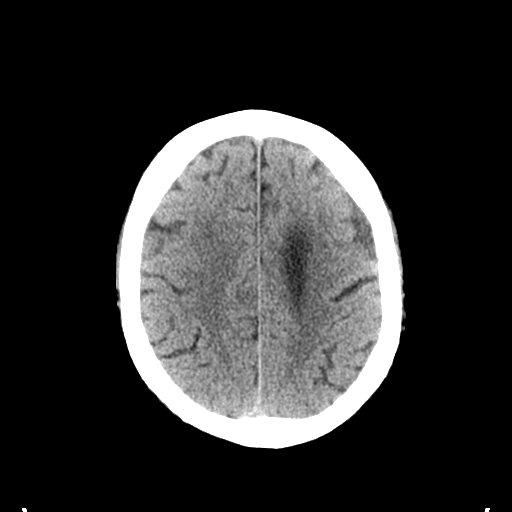
[im 26/33  brain]
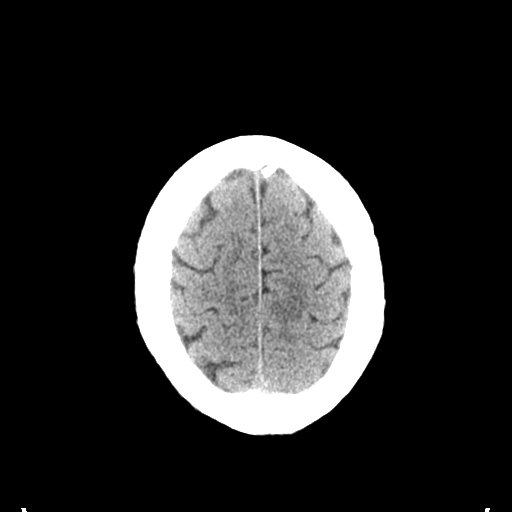
[im 30/33  brain]
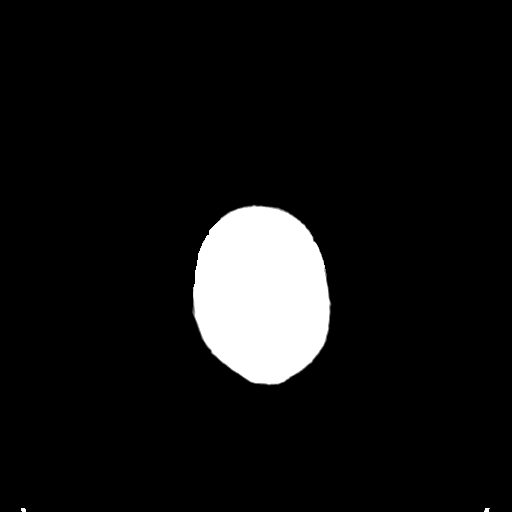
[im 30/33  bone]
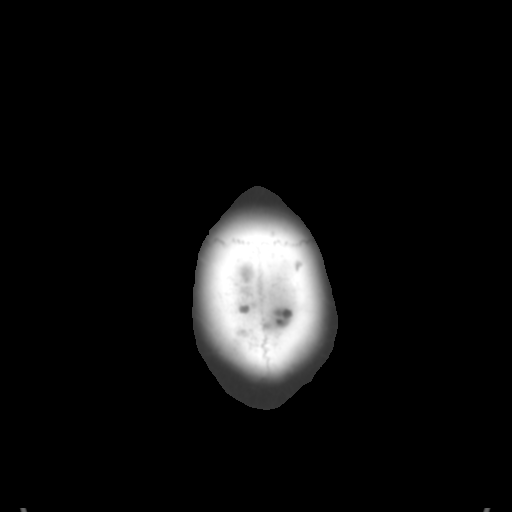

[Series 6: coronal soft tissue · coronal · 0.31mm/px · 3 of 62 slices shown]
[im 21/62  brain]
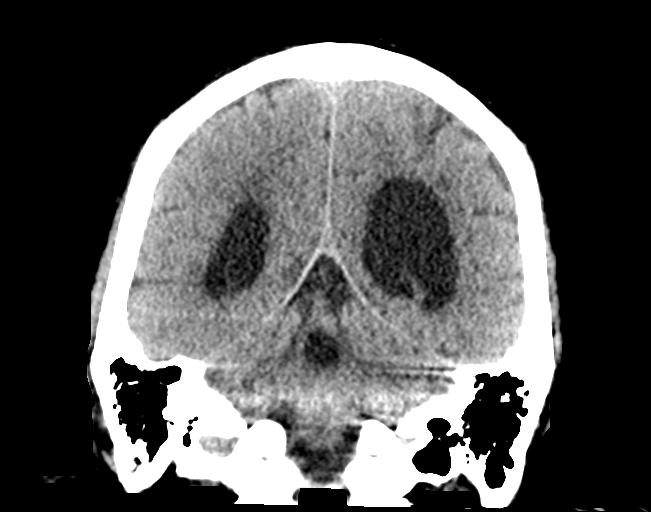
[im 28/62  brain]
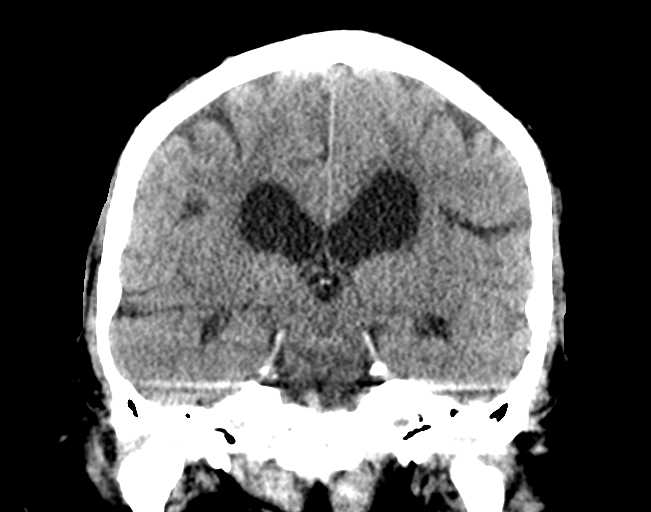
[im 34/62  brain]
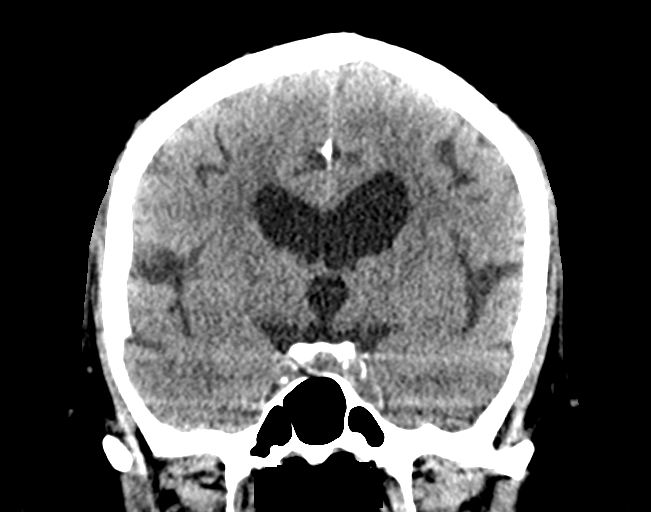

[Series 7: sagittal soft tissue · sagittal · 0.34mm/px · 3 of 54 slices shown]
[im 18/54  brain]
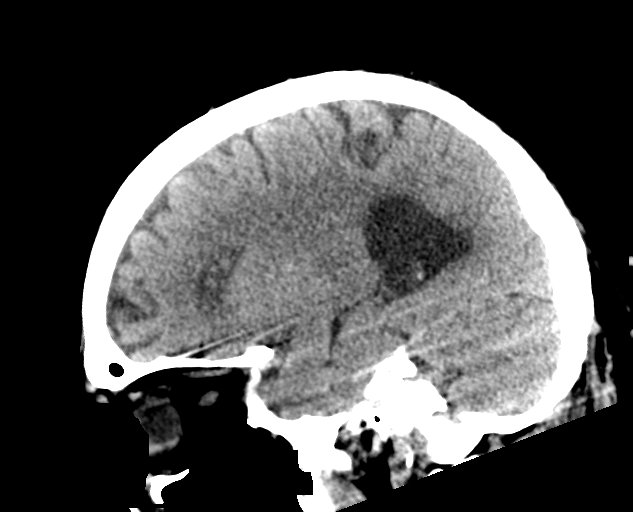
[im 27/54  brain]
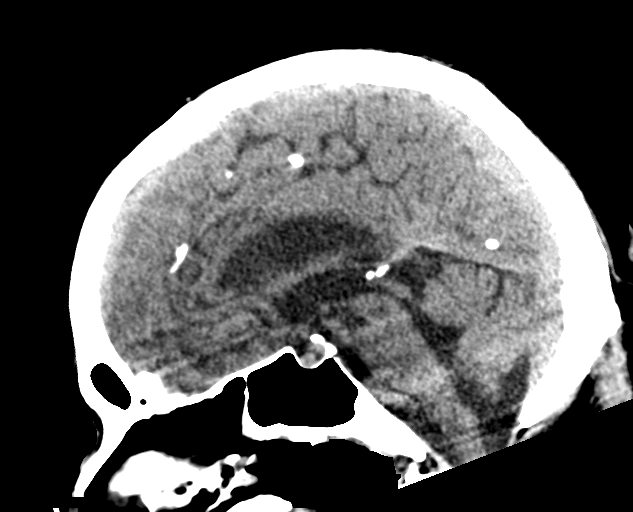
[im 36/54  brain]
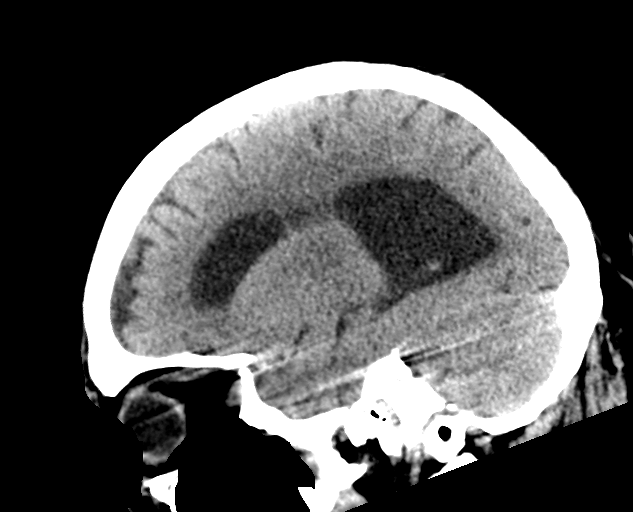

[15 of 47 positions shown; findings below may reference images not displayed]

FINDINGS: Brain: As seen previously, the brainstem and cerebellum are normal.
There is ventricular prominence without evidence old or acute focal
infarction of the cerebral hemispheres. No mass lesion, hemorrhage
or extra-axial collection. Ventricular size is unchanged since 5167
and likely secondary to central atrophy. As noted previously, normal
pressure hydrocephalus is not excluded.

Vascular: No abnormal vascular finding.

Skull: Negative

Sinuses/Orbits: Clear/normal

Other: None
IMPRESSION: Chronic ventriculomegaly without evidence of focal brain insult. No
visible change since September 2016. This is felt most likely
secondary to central atrophy. Normal pressure hydrocephalus is not
excluded but not favored.
